# Patient Record
Sex: Female | Born: 1935 | Race: Black or African American | Hispanic: No | Smoking: Former smoker
Health system: Southern US, Community
[De-identification: ages and names within clinical notes are randomized; demographics above are authoritative.]

## PROBLEM LIST (undated history)

## (undated) DIAGNOSIS — I209 Angina pectoris, unspecified: Secondary | ICD-10-CM

## (undated) DIAGNOSIS — E785 Hyperlipidemia, unspecified: Secondary | ICD-10-CM

## (undated) DIAGNOSIS — H18599 Other hereditary corneal dystrophies, unspecified eye: Secondary | ICD-10-CM

## (undated) DIAGNOSIS — H1859 Other hereditary corneal dystrophies: Secondary | ICD-10-CM

## (undated) DIAGNOSIS — M1990 Unspecified osteoarthritis, unspecified site: Secondary | ICD-10-CM

## (undated) DIAGNOSIS — M791 Myalgia, unspecified site: Secondary | ICD-10-CM

## (undated) DIAGNOSIS — D649 Anemia, unspecified: Secondary | ICD-10-CM

## (undated) DIAGNOSIS — J45909 Unspecified asthma, uncomplicated: Secondary | ICD-10-CM

## (undated) DIAGNOSIS — I219 Acute myocardial infarction, unspecified: Secondary | ICD-10-CM

## (undated) DIAGNOSIS — G4733 Obstructive sleep apnea (adult) (pediatric): Secondary | ICD-10-CM

## (undated) DIAGNOSIS — IMO0001 Reserved for inherently not codable concepts without codable children: Secondary | ICD-10-CM

## (undated) DIAGNOSIS — I509 Heart failure, unspecified: Secondary | ICD-10-CM

## (undated) DIAGNOSIS — Z9989 Dependence on other enabling machines and devices: Secondary | ICD-10-CM

## (undated) DIAGNOSIS — J189 Pneumonia, unspecified organism: Secondary | ICD-10-CM

## (undated) DIAGNOSIS — E119 Type 2 diabetes mellitus without complications: Secondary | ICD-10-CM

## (undated) DIAGNOSIS — E46 Unspecified protein-calorie malnutrition: Secondary | ICD-10-CM

## (undated) DIAGNOSIS — J449 Chronic obstructive pulmonary disease, unspecified: Secondary | ICD-10-CM

## (undated) DIAGNOSIS — I2510 Atherosclerotic heart disease of native coronary artery without angina pectoris: Secondary | ICD-10-CM

## (undated) DIAGNOSIS — IMO0002 Reserved for concepts with insufficient information to code with codable children: Secondary | ICD-10-CM

## (undated) DIAGNOSIS — R21 Rash and other nonspecific skin eruption: Secondary | ICD-10-CM

## (undated) DIAGNOSIS — R011 Cardiac murmur, unspecified: Secondary | ICD-10-CM

## (undated) DIAGNOSIS — I5032 Chronic diastolic (congestive) heart failure: Secondary | ICD-10-CM

## (undated) DIAGNOSIS — I10 Essential (primary) hypertension: Secondary | ICD-10-CM

## (undated) DIAGNOSIS — E876 Hypokalemia: Secondary | ICD-10-CM

## (undated) DIAGNOSIS — J302 Other seasonal allergic rhinitis: Secondary | ICD-10-CM

## (undated) HISTORY — DX: Acute myocardial infarction, unspecified: I21.9

## (undated) HISTORY — DX: Chronic diastolic (congestive) heart failure: I50.32

## (undated) HISTORY — PX: CHOLECYSTECTOMY: SHX55

## (undated) HISTORY — DX: Myalgia, unspecified site: M79.10

## (undated) HISTORY — DX: Hyperlipidemia, unspecified: E78.5

## (undated) HISTORY — DX: Anemia, unspecified: D64.9

## (undated) HISTORY — DX: Unspecified protein-calorie malnutrition: E46

## (undated) HISTORY — DX: Other hereditary corneal dystrophies: H18.59

## (undated) HISTORY — DX: Unspecified osteoarthritis, unspecified site: M19.90

## (undated) HISTORY — PX: EYE SURGERY: SHX253

## (undated) HISTORY — PX: CORNEAL TRANSPLANT: SHX108

## (undated) HISTORY — DX: Heart failure, unspecified: I50.9

## (undated) HISTORY — PX: OTHER SURGICAL HISTORY: SHX169

## (undated) HISTORY — DX: Essential (primary) hypertension: I10

## (undated) HISTORY — DX: Other hereditary corneal dystrophies, unspecified eye: H18.599

## (undated) HISTORY — PX: KNEE CARTILAGE SURGERY: SHX688

## (undated) HISTORY — DX: Reserved for concepts with insufficient information to code with codable children: IMO0002

## (undated) HISTORY — DX: Pneumonia, unspecified organism: J18.9

## (undated) HISTORY — PX: A FLUTTER ABLATION: SHX5348

## (undated) HISTORY — DX: Atherosclerotic heart disease of native coronary artery without angina pectoris: I25.10

## (undated) HISTORY — DX: Hypokalemia: E87.6

## (undated) HISTORY — PX: CATARACT EXTRACTION W/ INTRAOCULAR LENS  IMPLANT, BILATERAL: SHX1307

---

## 2002-06-06 ENCOUNTER — Encounter: Payer: Self-pay | Admitting: Internal Medicine

## 2002-06-06 ENCOUNTER — Encounter: Admission: RE | Admit: 2002-06-06 | Discharge: 2002-06-06 | Payer: Self-pay | Admitting: Internal Medicine

## 2003-12-10 ENCOUNTER — Encounter: Admission: RE | Admit: 2003-12-10 | Discharge: 2003-12-10 | Payer: Self-pay | Admitting: Orthopedic Surgery

## 2003-12-26 ENCOUNTER — Ambulatory Visit (HOSPITAL_COMMUNITY): Admission: RE | Admit: 2003-12-26 | Discharge: 2003-12-26 | Payer: Self-pay | Admitting: Orthopedic Surgery

## 2004-11-05 ENCOUNTER — Ambulatory Visit: Payer: Self-pay

## 2005-01-18 ENCOUNTER — Ambulatory Visit: Payer: Self-pay | Admitting: Internal Medicine

## 2005-01-26 ENCOUNTER — Ambulatory Visit: Payer: Self-pay | Admitting: Internal Medicine

## 2005-02-05 ENCOUNTER — Ambulatory Visit: Payer: Self-pay | Admitting: Internal Medicine

## 2005-05-19 ENCOUNTER — Ambulatory Visit: Payer: Self-pay | Admitting: Internal Medicine

## 2005-11-23 ENCOUNTER — Ambulatory Visit: Payer: Self-pay | Admitting: Internal Medicine

## 2006-02-23 ENCOUNTER — Ambulatory Visit: Payer: Self-pay | Admitting: Internal Medicine

## 2006-03-08 ENCOUNTER — Ambulatory Visit: Payer: Self-pay | Admitting: Internal Medicine

## 2006-05-27 HISTORY — PX: COLONOSCOPY: SHX174

## 2006-06-13 ENCOUNTER — Encounter: Payer: Self-pay | Admitting: Gastroenterology

## 2006-06-16 ENCOUNTER — Ambulatory Visit: Payer: Self-pay | Admitting: Internal Medicine

## 2006-08-05 ENCOUNTER — Ambulatory Visit: Payer: Self-pay | Admitting: Internal Medicine

## 2006-11-10 ENCOUNTER — Ambulatory Visit: Payer: Self-pay | Admitting: Internal Medicine

## 2006-11-10 LAB — CONVERTED CEMR LAB
ALT: 13 units/L (ref 0–40)
AST: 17 units/L (ref 0–37)
BUN: 18 mg/dL (ref 6–23)
Chol/HDL Ratio, serum: 3.3
Cholesterol: 117 mg/dL (ref 0–200)
Creatinine, Ser: 0.8 mg/dL (ref 0.4–1.2)
HDL: 35.9 mg/dL — ABNORMAL LOW (ref >39.0)
Hgb A1c MFr Bld: 7.1 % — ABNORMAL HIGH (ref 4.6–6.0)
LDL Cholesterol: 71 mg/dL (ref 0–99)
Triglyceride fasting, serum: 52 mg/dL (ref 0–149)
VLDL: 10 mg/dL (ref 0–40)

## 2006-11-21 ENCOUNTER — Ambulatory Visit: Payer: Self-pay | Admitting: Internal Medicine

## 2006-12-27 DIAGNOSIS — I219 Acute myocardial infarction, unspecified: Secondary | ICD-10-CM

## 2006-12-27 HISTORY — DX: Acute myocardial infarction, unspecified: I21.9

## 2007-02-25 HISTORY — PX: CORONARY ANGIOPLASTY WITH STENT PLACEMENT: SHX49

## 2007-03-13 ENCOUNTER — Ambulatory Visit: Payer: Self-pay | Admitting: Cardiology

## 2007-03-13 ENCOUNTER — Inpatient Hospital Stay (HOSPITAL_COMMUNITY): Admission: AD | Admit: 2007-03-13 | Discharge: 2007-03-15 | Payer: Self-pay | Admitting: Internal Medicine

## 2007-03-13 ENCOUNTER — Ambulatory Visit: Payer: Self-pay | Admitting: Internal Medicine

## 2007-03-14 ENCOUNTER — Ambulatory Visit: Payer: Self-pay | Admitting: *Deleted

## 2007-03-21 ENCOUNTER — Ambulatory Visit: Payer: Self-pay | Admitting: Cardiology

## 2007-03-21 LAB — CONVERTED CEMR LAB
INR: 7.3 (ref 0.9–2.0)
Prothrombin Time: 35.3 s (ref 10.0–14.0)

## 2007-03-27 ENCOUNTER — Ambulatory Visit: Payer: Self-pay | Admitting: Cardiovascular Disease

## 2007-04-03 ENCOUNTER — Ambulatory Visit: Payer: Self-pay | Admitting: Cardiology

## 2007-04-06 ENCOUNTER — Ambulatory Visit: Payer: Self-pay | Admitting: Cardiology

## 2007-04-14 ENCOUNTER — Ambulatory Visit: Payer: Self-pay | Admitting: Internal Medicine

## 2007-04-24 ENCOUNTER — Ambulatory Visit: Payer: Self-pay | Admitting: Cardiology

## 2007-05-01 ENCOUNTER — Ambulatory Visit: Payer: Self-pay | Admitting: Cardiology

## 2007-05-08 ENCOUNTER — Ambulatory Visit: Payer: Self-pay | Admitting: Internal Medicine

## 2007-05-12 DIAGNOSIS — H1851 Endothelial corneal dystrophy: Secondary | ICD-10-CM

## 2007-05-15 ENCOUNTER — Ambulatory Visit: Payer: Self-pay | Admitting: Internal Medicine

## 2007-05-23 ENCOUNTER — Ambulatory Visit: Payer: Self-pay | Admitting: Cardiology

## 2007-05-31 ENCOUNTER — Ambulatory Visit: Payer: Self-pay | Admitting: Cardiology

## 2007-05-31 LAB — CONVERTED CEMR LAB
INR: 6.1 (ref 0.9–2.0)
Prothrombin Time: 32 s (ref 10.0–14.0)

## 2007-06-07 ENCOUNTER — Ambulatory Visit: Payer: Self-pay | Admitting: Cardiology

## 2007-06-14 ENCOUNTER — Ambulatory Visit: Payer: Self-pay | Admitting: Cardiology

## 2007-06-20 ENCOUNTER — Ambulatory Visit: Payer: Self-pay | Admitting: Cardiology

## 2007-06-26 ENCOUNTER — Ambulatory Visit: Payer: Self-pay | Admitting: Cardiology

## 2007-06-29 ENCOUNTER — Ambulatory Visit: Payer: Self-pay | Admitting: Internal Medicine

## 2007-06-29 LAB — CONVERTED CEMR LAB
BUN: 14 mg/dL (ref 6–23)
Basophils Absolute: 0.1 10*3/uL (ref 0.0–0.1)
Basophils Relative: 1.1 % — ABNORMAL HIGH (ref 0.0–1.0)
CO2: 30 meq/L (ref 19–32)
Calcium: 9.3 mg/dL (ref 8.4–10.5)
Chloride: 100 meq/L (ref 96–112)
Creatinine, Ser: 0.9 mg/dL (ref 0.4–1.2)
Eosinophils Absolute: 0.3 10*3/uL (ref 0.0–0.6)
Eosinophils Relative: 6.1 % — ABNORMAL HIGH (ref 0.0–5.0)
GFR calc Af Amer: 79 mL/min
GFR calc non Af Amer: 65 mL/min
Glucose, Bld: 235 mg/dL — ABNORMAL HIGH (ref 70–99)
HCT: 39.3 % (ref 36.0–46.0)
Hemoglobin: 13 g/dL (ref 12.0–15.0)
INR: 2.8 — ABNORMAL HIGH (ref 0.9–2.0)
Lymphocytes Relative: 20.1 % (ref 12.0–46.0)
MCHC: 33.1 g/dL (ref 30.0–36.0)
MCV: 84.5 fL (ref 78.0–100.0)
Monocytes Absolute: 0.3 10*3/uL (ref 0.2–0.7)
Monocytes Relative: 5.6 % (ref 3.0–11.0)
Neutro Abs: 3.3 10*3/uL (ref 1.4–7.7)
Neutrophils Relative %: 67.1 % (ref 43.0–77.0)
Platelets: 258 10*3/uL (ref 150–400)
Potassium: 4 meq/L (ref 3.5–5.1)
Prothrombin Time: 21.2 s — ABNORMAL HIGH (ref 10.0–14.0)
RBC: 4.65 M/uL (ref 3.87–5.11)
RDW: 13.5 % (ref 11.5–14.6)
Sodium: 137 meq/L (ref 135–145)
WBC: 5 10*3/uL (ref 4.5–10.5)
aPTT: 37.3 s — ABNORMAL HIGH (ref 26.5–36.5)

## 2007-07-04 ENCOUNTER — Ambulatory Visit: Payer: Self-pay | Admitting: Cardiology

## 2007-07-05 ENCOUNTER — Ambulatory Visit (HOSPITAL_COMMUNITY): Admission: RE | Admit: 2007-07-05 | Discharge: 2007-07-06 | Payer: Self-pay | Admitting: Internal Medicine

## 2007-07-05 ENCOUNTER — Ambulatory Visit: Payer: Self-pay | Admitting: Internal Medicine

## 2007-07-05 ENCOUNTER — Encounter: Payer: Self-pay | Admitting: Internal Medicine

## 2007-07-05 ENCOUNTER — Encounter: Payer: Self-pay | Admitting: Cardiology

## 2007-07-11 ENCOUNTER — Ambulatory Visit: Payer: Self-pay | Admitting: Cardiovascular Disease

## 2007-07-18 ENCOUNTER — Ambulatory Visit: Payer: Self-pay

## 2007-07-18 ENCOUNTER — Encounter: Payer: Self-pay | Admitting: Internal Medicine

## 2007-08-09 ENCOUNTER — Ambulatory Visit: Payer: Self-pay | Admitting: Internal Medicine

## 2007-08-31 ENCOUNTER — Encounter: Payer: Self-pay | Admitting: Internal Medicine

## 2007-09-08 ENCOUNTER — Encounter (INDEPENDENT_AMBULATORY_CARE_PROVIDER_SITE_OTHER): Payer: Self-pay | Admitting: Family Medicine

## 2007-10-05 ENCOUNTER — Telehealth (INDEPENDENT_AMBULATORY_CARE_PROVIDER_SITE_OTHER): Payer: Self-pay | Admitting: *Deleted

## 2007-10-13 ENCOUNTER — Ambulatory Visit: Payer: Self-pay | Admitting: Internal Medicine

## 2007-10-18 ENCOUNTER — Telehealth (INDEPENDENT_AMBULATORY_CARE_PROVIDER_SITE_OTHER): Payer: Self-pay | Admitting: *Deleted

## 2007-10-22 LAB — CONVERTED CEMR LAB
ALT: 20 units/L (ref 0–35)
AST: 26 units/L (ref 0–37)
BUN: 14 mg/dL (ref 6–23)
Cholesterol: 171 mg/dL (ref 0–200)
Creatinine, Ser: 0.8 mg/dL (ref 0.4–1.2)
Creatinine,U: 174.7 mg/dL
HDL: 45.5 mg/dL (ref >39.0)
Hgb A1c MFr Bld: 8.1 % — ABNORMAL HIGH (ref 4.6–6.0)
LDL Cholesterol: 115 mg/dL — ABNORMAL HIGH (ref 0–99)
Microalb Creat Ratio: 31.5 mg/g — ABNORMAL HIGH (ref 0.0–30.0)
Microalb, Ur: 5.5 mg/dL — ABNORMAL HIGH (ref 0.0–1.9)
Potassium: 4 meq/L (ref 3.5–5.1)
Total CHOL/HDL Ratio: 3.8
Triglycerides: 55 mg/dL (ref 0–149)
VLDL: 11 mg/dL (ref 0–40)

## 2007-10-24 ENCOUNTER — Encounter (INDEPENDENT_AMBULATORY_CARE_PROVIDER_SITE_OTHER): Payer: Self-pay | Admitting: *Deleted

## 2007-10-30 ENCOUNTER — Ambulatory Visit: Payer: Self-pay | Admitting: Internal Medicine

## 2007-10-30 DIAGNOSIS — I10 Essential (primary) hypertension: Secondary | ICD-10-CM | POA: Insufficient documentation

## 2007-10-30 DIAGNOSIS — E1159 Type 2 diabetes mellitus with other circulatory complications: Secondary | ICD-10-CM | POA: Insufficient documentation

## 2007-11-10 ENCOUNTER — Telehealth: Payer: Self-pay | Admitting: Internal Medicine

## 2008-01-03 ENCOUNTER — Telehealth (INDEPENDENT_AMBULATORY_CARE_PROVIDER_SITE_OTHER): Payer: Self-pay | Admitting: *Deleted

## 2008-01-25 ENCOUNTER — Ambulatory Visit: Payer: Self-pay | Admitting: Internal Medicine

## 2008-01-28 LAB — CONVERTED CEMR LAB
ALT: 16 units/L (ref 0–35)
AST: 22 units/L (ref 0–37)
Albumin: 3.8 g/dL (ref 3.5–5.2)
Alkaline Phosphatase: 28 units/L — ABNORMAL LOW (ref 39–117)
BUN: 16 mg/dL (ref 6–23)
Bilirubin, Direct: 0.1 mg/dL (ref 0.0–0.3)
CO2: 31 meq/L (ref 19–32)
Calcium: 10 mg/dL (ref 8.4–10.5)
Chloride: 102 meq/L (ref 96–112)
Cholesterol: 152 mg/dL (ref 0–200)
Creatinine, Ser: 0.8 mg/dL (ref 0.4–1.2)
Creatinine,U: 183.9 mg/dL
GFR calc Af Amer: 90 mL/min
GFR calc non Af Amer: 75 mL/min
Glucose, Bld: 121 mg/dL — ABNORMAL HIGH (ref 70–99)
HDL: 49.8 mg/dL (ref >39.0)
Hgb A1c MFr Bld: 7.3 % — ABNORMAL HIGH (ref 4.6–6.0)
LDL Cholesterol: 92 mg/dL (ref 0–99)
Microalb Creat Ratio: 10.3 mg/g (ref 0.0–30.0)
Microalb, Ur: 1.9 mg/dL (ref 0.0–1.9)
Potassium: 4.5 meq/L (ref 3.5–5.1)
Sodium: 141 meq/L (ref 135–145)
Total Bilirubin: 0.7 mg/dL (ref 0.3–1.2)
Total CHOL/HDL Ratio: 3.1
Total Protein: 7.4 g/dL (ref 6.0–8.3)
Triglycerides: 49 mg/dL (ref 0–149)
VLDL: 10 mg/dL (ref 0–40)

## 2008-01-29 ENCOUNTER — Ambulatory Visit: Payer: Self-pay | Admitting: Internal Medicine

## 2008-01-29 ENCOUNTER — Encounter (INDEPENDENT_AMBULATORY_CARE_PROVIDER_SITE_OTHER): Payer: Self-pay | Admitting: *Deleted

## 2008-01-29 DIAGNOSIS — E1169 Type 2 diabetes mellitus with other specified complication: Secondary | ICD-10-CM | POA: Insufficient documentation

## 2008-01-29 DIAGNOSIS — I2510 Atherosclerotic heart disease of native coronary artery without angina pectoris: Secondary | ICD-10-CM | POA: Insufficient documentation

## 2008-01-29 DIAGNOSIS — E782 Mixed hyperlipidemia: Secondary | ICD-10-CM | POA: Insufficient documentation

## 2008-02-13 ENCOUNTER — Ambulatory Visit: Payer: Self-pay | Admitting: Internal Medicine

## 2008-06-04 ENCOUNTER — Telehealth (INDEPENDENT_AMBULATORY_CARE_PROVIDER_SITE_OTHER): Payer: Self-pay | Admitting: *Deleted

## 2008-06-17 ENCOUNTER — Telehealth (INDEPENDENT_AMBULATORY_CARE_PROVIDER_SITE_OTHER): Payer: Self-pay | Admitting: *Deleted

## 2008-06-25 ENCOUNTER — Ambulatory Visit: Payer: Self-pay | Admitting: Internal Medicine

## 2008-06-25 DIAGNOSIS — I4892 Unspecified atrial flutter: Secondary | ICD-10-CM

## 2008-06-25 LAB — CONVERTED CEMR LAB
Cholesterol, target level: 200 mg/dL
HDL goal, serum: 40 mg/dL
LDL Goal: 70 mg/dL

## 2008-06-30 LAB — CONVERTED CEMR LAB
ALT: 16 units/L (ref 0–35)
AST: 24 units/L (ref 0–37)
Albumin: 4.1 g/dL (ref 3.5–5.2)
Alkaline Phosphatase: 39 units/L (ref 39–117)
BUN: 15 mg/dL (ref 6–23)
Basophils Absolute: 0 10*3/uL (ref 0.0–0.1)
Basophils Relative: 0.2 % (ref 0.0–1.0)
Bilirubin, Direct: 0.1 mg/dL (ref 0.0–0.3)
Cholesterol: 167 mg/dL (ref 0–200)
Creatinine, Ser: 0.9 mg/dL (ref 0.4–1.2)
Eosinophils Absolute: 0.3 10*3/uL (ref 0.0–0.7)
Eosinophils Relative: 6.4 % — ABNORMAL HIGH (ref 0.0–5.0)
HCT: 35.3 % — ABNORMAL LOW (ref 36.0–46.0)
HDL: 54 mg/dL (ref >39.0)
Hemoglobin: 11.6 g/dL — ABNORMAL LOW (ref 12.0–15.0)
LDL Cholesterol: 103 mg/dL — ABNORMAL HIGH (ref 0–99)
Lymphocytes Relative: 31.5 % (ref 12.0–46.0)
MCHC: 32.8 g/dL (ref 30.0–36.0)
MCV: 86.5 fL (ref 78.0–100.0)
Monocytes Absolute: 0.6 10*3/uL (ref 0.1–1.0)
Monocytes Relative: 13.9 % — ABNORMAL HIGH (ref 3.0–12.0)
Neutro Abs: 2.3 10*3/uL (ref 1.4–7.7)
Neutrophils Relative %: 48 % (ref 43.0–77.0)
Platelets: 251 10*3/uL (ref 150–400)
Potassium: 3.8 meq/L (ref 3.5–5.1)
RBC: 4.08 M/uL (ref 3.87–5.11)
RDW: 13.3 % (ref 11.5–14.6)
TSH: 1.82 microintl units/mL (ref 0.35–5.50)
Total Bilirubin: 0.7 mg/dL (ref 0.3–1.2)
Total CHOL/HDL Ratio: 3.1
Total Protein: 8.2 g/dL (ref 6.0–8.3)
Triglycerides: 48 mg/dL (ref 0–149)
VLDL: 10 mg/dL (ref 0–40)
WBC: 4.6 10*3/uL (ref 4.5–10.5)

## 2008-07-02 ENCOUNTER — Encounter (INDEPENDENT_AMBULATORY_CARE_PROVIDER_SITE_OTHER): Payer: Self-pay | Admitting: *Deleted

## 2008-07-08 ENCOUNTER — Encounter (INDEPENDENT_AMBULATORY_CARE_PROVIDER_SITE_OTHER): Payer: Self-pay | Admitting: *Deleted

## 2008-07-08 ENCOUNTER — Ambulatory Visit: Payer: Self-pay | Admitting: Internal Medicine

## 2008-07-08 LAB — CONVERTED CEMR LAB
OCCULT 1: NEGATIVE
OCCULT 2: NEGATIVE
OCCULT 3: NEGATIVE

## 2008-07-23 ENCOUNTER — Telehealth (INDEPENDENT_AMBULATORY_CARE_PROVIDER_SITE_OTHER): Payer: Self-pay | Admitting: *Deleted

## 2008-10-21 ENCOUNTER — Ambulatory Visit: Payer: Self-pay | Admitting: Internal Medicine

## 2008-11-04 LAB — CONVERTED CEMR LAB
ALT: 13 units/L (ref 0–35)
AST: 22 units/L (ref 0–37)
Basophils Absolute: 0 10*3/uL (ref 0.0–0.1)
Basophils Relative: 0.8 % (ref 0.0–3.0)
Cholesterol: 207 mg/dL (ref 0–200)
Direct LDL: 130.7 mg/dL
Eosinophils Absolute: 0.3 10*3/uL (ref 0.0–0.7)
Eosinophils Relative: 7.5 % — ABNORMAL HIGH (ref 0.0–5.0)
Folate: >20 ng/mL
HCT: 30.5 % — ABNORMAL LOW (ref 36.0–46.0)
HDL: 59.2 mg/dL (ref >39.0)
Hemoglobin: 10 g/dL — ABNORMAL LOW (ref 12.0–15.0)
Hgb A1c MFr Bld: 7.1 % — ABNORMAL HIGH (ref 4.6–6.0)
Iron: 14 ug/dL — ABNORMAL LOW (ref 42–145)
Lymphocytes Relative: 27.7 % (ref 12.0–46.0)
MCHC: 32.7 g/dL (ref 30.0–36.0)
MCV: 81.5 fL (ref 78.0–100.0)
Monocytes Absolute: 0.7 10*3/uL (ref 0.1–1.0)
Monocytes Relative: 15 % — ABNORMAL HIGH (ref 3.0–12.0)
Neutro Abs: 2.2 10*3/uL (ref 1.4–7.7)
Neutrophils Relative %: 49 % (ref 43.0–77.0)
Platelets: 250 10*3/uL (ref 150–400)
RBC: 3.74 M/uL — ABNORMAL LOW (ref 3.87–5.11)
RDW: 14.7 % — ABNORMAL HIGH (ref 11.5–14.6)
Total CHOL/HDL Ratio: 3.5
Triglycerides: 45 mg/dL (ref 0–149)
VLDL: 9 mg/dL (ref 0–40)
Vitamin B-12: 157 pg/mL — ABNORMAL LOW (ref 211–911)
WBC: 4.4 10*3/uL — ABNORMAL LOW (ref 4.5–10.5)

## 2008-11-06 ENCOUNTER — Encounter (INDEPENDENT_AMBULATORY_CARE_PROVIDER_SITE_OTHER): Payer: Self-pay | Admitting: *Deleted

## 2008-11-20 ENCOUNTER — Ambulatory Visit: Payer: Self-pay | Admitting: Internal Medicine

## 2008-11-20 DIAGNOSIS — D510 Vitamin B12 deficiency anemia due to intrinsic factor deficiency: Secondary | ICD-10-CM

## 2008-11-20 DIAGNOSIS — M1990 Unspecified osteoarthritis, unspecified site: Secondary | ICD-10-CM | POA: Insufficient documentation

## 2008-11-22 ENCOUNTER — Encounter (INDEPENDENT_AMBULATORY_CARE_PROVIDER_SITE_OTHER): Payer: Self-pay | Admitting: *Deleted

## 2008-11-22 ENCOUNTER — Telehealth: Payer: Self-pay | Admitting: Internal Medicine

## 2008-11-26 ENCOUNTER — Telehealth (INDEPENDENT_AMBULATORY_CARE_PROVIDER_SITE_OTHER): Payer: Self-pay | Admitting: *Deleted

## 2008-11-27 ENCOUNTER — Ambulatory Visit: Payer: Self-pay | Admitting: Internal Medicine

## 2008-11-27 ENCOUNTER — Encounter (INDEPENDENT_AMBULATORY_CARE_PROVIDER_SITE_OTHER): Payer: Self-pay | Admitting: *Deleted

## 2008-11-27 LAB — CONVERTED CEMR LAB
OCCULT 1: NEGATIVE
OCCULT 2: NEGATIVE
OCCULT 3: NEGATIVE
Rheumatoid fact SerPl-aCnc: <20 intl units/mL — ABNORMAL LOW (ref 0.0–20.0)
Sed Rate: 40 mm/hr — ABNORMAL HIGH (ref 0–22)
Total CK: 90 units/L (ref 7–177)
Uric Acid, Serum: 3.7 mg/dL (ref 2.4–7.0)
Vit D, 1,25-Dihydroxy: 33 (ref 30–89)

## 2008-11-28 ENCOUNTER — Encounter (INDEPENDENT_AMBULATORY_CARE_PROVIDER_SITE_OTHER): Payer: Self-pay | Admitting: *Deleted

## 2008-12-04 ENCOUNTER — Ambulatory Visit: Payer: Self-pay | Admitting: Internal Medicine

## 2008-12-11 ENCOUNTER — Ambulatory Visit: Payer: Self-pay | Admitting: Internal Medicine

## 2008-12-11 ENCOUNTER — Telehealth (INDEPENDENT_AMBULATORY_CARE_PROVIDER_SITE_OTHER): Payer: Self-pay | Admitting: *Deleted

## 2008-12-24 ENCOUNTER — Ambulatory Visit: Payer: Self-pay | Admitting: Cardiology

## 2008-12-24 ENCOUNTER — Ambulatory Visit: Payer: Self-pay | Admitting: Internal Medicine

## 2008-12-24 ENCOUNTER — Inpatient Hospital Stay (HOSPITAL_COMMUNITY): Admission: EM | Admit: 2008-12-24 | Discharge: 2008-12-27 | Payer: Self-pay | Admitting: Emergency Medicine

## 2008-12-24 DIAGNOSIS — R5383 Other fatigue: Secondary | ICD-10-CM

## 2008-12-24 DIAGNOSIS — R0989 Other specified symptoms and signs involving the circulatory and respiratory systems: Secondary | ICD-10-CM

## 2008-12-24 DIAGNOSIS — R0609 Other forms of dyspnea: Secondary | ICD-10-CM

## 2008-12-24 DIAGNOSIS — R5381 Other malaise: Secondary | ICD-10-CM

## 2008-12-25 ENCOUNTER — Encounter (INDEPENDENT_AMBULATORY_CARE_PROVIDER_SITE_OTHER): Payer: Self-pay | Admitting: Emergency Medicine

## 2008-12-27 ENCOUNTER — Encounter: Payer: Self-pay | Admitting: Endocrinology

## 2008-12-28 ENCOUNTER — Telehealth: Payer: Self-pay | Admitting: Internal Medicine

## 2008-12-30 ENCOUNTER — Telehealth (INDEPENDENT_AMBULATORY_CARE_PROVIDER_SITE_OTHER): Payer: Self-pay | Admitting: *Deleted

## 2008-12-31 ENCOUNTER — Ambulatory Visit: Payer: Self-pay | Admitting: Internal Medicine

## 2009-01-02 ENCOUNTER — Ambulatory Visit: Payer: Self-pay | Admitting: Internal Medicine

## 2009-01-02 DIAGNOSIS — D509 Iron deficiency anemia, unspecified: Secondary | ICD-10-CM

## 2009-01-07 ENCOUNTER — Ambulatory Visit: Payer: Self-pay | Admitting: Cardiovascular Disease

## 2009-01-08 ENCOUNTER — Encounter (INDEPENDENT_AMBULATORY_CARE_PROVIDER_SITE_OTHER): Payer: Self-pay | Admitting: *Deleted

## 2009-01-08 ENCOUNTER — Ambulatory Visit: Payer: Self-pay | Admitting: Gastroenterology

## 2009-01-08 ENCOUNTER — Ambulatory Visit: Payer: Self-pay | Admitting: Internal Medicine

## 2009-01-08 LAB — CONVERTED CEMR LAB
Basophils Absolute: 0.1 K/uL
Basophils Relative: 1.1 %
Eosinophils Absolute: 0.2 K/uL
Eosinophils Relative: 3.8 %
HCT: 31.5 % — ABNORMAL LOW
Hemoglobin: 10.2 g/dL — ABNORMAL LOW
Lymphocytes Relative: 20.2 %
MCHC: 32.4 g/dL
MCV: 77.4 fL — ABNORMAL LOW
Monocytes Absolute: 0.3 K/uL
Monocytes Relative: 6.1 %
Neutro Abs: 3.5 K/uL
Neutrophils Relative %: 68.8 %
Platelets: 304 K/uL
RBC: 4.07 M/uL
RDW: 16.1 % — ABNORMAL HIGH
WBC: 5.1 10*3/microliter

## 2009-01-09 ENCOUNTER — Encounter (INDEPENDENT_AMBULATORY_CARE_PROVIDER_SITE_OTHER): Payer: Self-pay | Admitting: *Deleted

## 2009-01-09 ENCOUNTER — Ambulatory Visit: Payer: Self-pay | Admitting: Internal Medicine

## 2009-01-09 LAB — CONVERTED CEMR LAB
OCCULT 1: NEGATIVE
OCCULT 2: NEGATIVE
OCCULT 3: NEGATIVE

## 2009-01-17 ENCOUNTER — Ambulatory Visit: Payer: Self-pay | Admitting: Internal Medicine

## 2009-01-21 ENCOUNTER — Encounter: Payer: Self-pay | Admitting: Gastroenterology

## 2009-01-21 ENCOUNTER — Ambulatory Visit: Payer: Self-pay | Admitting: Gastroenterology

## 2009-01-23 ENCOUNTER — Encounter: Payer: Self-pay | Admitting: Gastroenterology

## 2009-01-27 ENCOUNTER — Telehealth: Payer: Self-pay | Admitting: Gastroenterology

## 2009-01-28 ENCOUNTER — Telehealth (INDEPENDENT_AMBULATORY_CARE_PROVIDER_SITE_OTHER): Payer: Self-pay | Admitting: *Deleted

## 2009-01-28 ENCOUNTER — Telehealth: Payer: Self-pay | Admitting: Gastroenterology

## 2009-01-29 ENCOUNTER — Encounter: Payer: Self-pay | Admitting: Internal Medicine

## 2009-01-29 ENCOUNTER — Ambulatory Visit: Payer: Self-pay | Admitting: Cardiology

## 2009-01-29 ENCOUNTER — Ambulatory Visit: Payer: Self-pay | Admitting: Internal Medicine

## 2009-01-29 LAB — CONVERTED CEMR LAB
BUN: 15 mg/dL (ref 6–23)
Basophils Absolute: 0 10*3/uL (ref 0.0–0.1)
Basophils Relative: 0.5 % (ref 0.0–3.0)
CO2: 29 meq/L (ref 19–32)
Calcium: 9.6 mg/dL (ref 8.4–10.5)
Chloride: 102 meq/L (ref 96–112)
Creatinine, Ser: 0.8 mg/dL (ref 0.4–1.2)
Eosinophils Absolute: 0.2 10*3/uL (ref 0.0–0.7)
Eosinophils Relative: 4.5 % (ref 0.0–5.0)
GFR calc Af Amer: 90 mL/min
GFR calc non Af Amer: 74 mL/min
Glucose, Bld: 162 mg/dL — ABNORMAL HIGH (ref 70–99)
HCT: 35.5 % — ABNORMAL LOW (ref 36.0–46.0)
Hemoglobin: 11.1 g/dL — ABNORMAL LOW (ref 12.0–15.0)
INR: 2.2 — ABNORMAL HIGH (ref 0.8–1.0)
Lymphocytes Relative: 24.6 % (ref 12.0–46.0)
MCHC: 31.2 g/dL (ref 30.0–36.0)
MCV: 77.7 fL — ABNORMAL LOW (ref 78.0–100.0)
Magnesium: 1.5 mg/dL (ref 1.5–2.5)
Monocytes Absolute: 0.5 10*3/uL (ref 0.1–1.0)
Monocytes Relative: 10 % (ref 3.0–12.0)
Neutro Abs: 2.8 10*3/uL (ref 1.4–7.7)
Neutrophils Relative %: 60.4 % (ref 43.0–77.0)
Platelets: 298 10*3/uL (ref 150–400)
Potassium: 3.4 meq/L — ABNORMAL LOW (ref 3.5–5.1)
Prothrombin Time: 22.3 s — ABNORMAL HIGH (ref 10.9–13.3)
RBC: 4.56 M/uL (ref 3.87–5.11)
RDW: 17.9 % — ABNORMAL HIGH (ref 11.5–14.6)
Sodium: 141 meq/L (ref 135–145)
WBC: 4.7 10*3/uL (ref 4.5–10.5)
aPTT: 36.8 s — ABNORMAL HIGH (ref 21.7–29.8)

## 2009-01-30 ENCOUNTER — Telehealth: Payer: Self-pay | Admitting: Internal Medicine

## 2009-01-31 ENCOUNTER — Ambulatory Visit (HOSPITAL_COMMUNITY): Admission: RE | Admit: 2009-01-31 | Discharge: 2009-01-31 | Payer: Self-pay | Admitting: Internal Medicine

## 2009-01-31 ENCOUNTER — Ambulatory Visit: Payer: Self-pay | Admitting: Internal Medicine

## 2009-02-04 ENCOUNTER — Telehealth (INDEPENDENT_AMBULATORY_CARE_PROVIDER_SITE_OTHER): Payer: Self-pay | Admitting: *Deleted

## 2009-02-05 ENCOUNTER — Ambulatory Visit: Payer: Self-pay | Admitting: Internal Medicine

## 2009-02-05 DIAGNOSIS — E876 Hypokalemia: Secondary | ICD-10-CM

## 2009-02-09 LAB — CONVERTED CEMR LAB
Hgb A1c MFr Bld: 6.8 % — ABNORMAL HIGH (ref 4.6–6.0)
Iron: 39 ug/dL — ABNORMAL LOW (ref 42–145)
Potassium: 4.5 meq/L (ref 3.5–5.1)
Saturation Ratios: 7.7 % — ABNORMAL LOW (ref 20.0–50.0)
Transferrin: 360.8 mg/dL — ABNORMAL HIGH (ref >=212.0)

## 2009-02-10 ENCOUNTER — Encounter (INDEPENDENT_AMBULATORY_CARE_PROVIDER_SITE_OTHER): Payer: Self-pay | Admitting: *Deleted

## 2009-02-12 ENCOUNTER — Ambulatory Visit: Payer: Self-pay | Admitting: Cardiology

## 2009-02-21 ENCOUNTER — Ambulatory Visit: Payer: Self-pay | Admitting: Internal Medicine

## 2009-02-28 ENCOUNTER — Ambulatory Visit: Payer: Self-pay | Admitting: Internal Medicine

## 2009-03-05 ENCOUNTER — Ambulatory Visit: Payer: Self-pay | Admitting: Internal Medicine

## 2009-03-14 ENCOUNTER — Ambulatory Visit: Payer: Self-pay | Admitting: Cardiology

## 2009-04-04 ENCOUNTER — Ambulatory Visit: Payer: Self-pay | Admitting: Cardiology

## 2009-05-02 ENCOUNTER — Ambulatory Visit: Payer: Self-pay | Admitting: Cardiovascular Disease

## 2009-05-02 ENCOUNTER — Telehealth (INDEPENDENT_AMBULATORY_CARE_PROVIDER_SITE_OTHER): Payer: Self-pay | Admitting: *Deleted

## 2009-05-12 ENCOUNTER — Telehealth: Payer: Self-pay | Admitting: Internal Medicine

## 2009-05-13 ENCOUNTER — Ambulatory Visit: Payer: Self-pay | Admitting: Internal Medicine

## 2009-05-20 ENCOUNTER — Telehealth (INDEPENDENT_AMBULATORY_CARE_PROVIDER_SITE_OTHER): Payer: Self-pay | Admitting: *Deleted

## 2009-05-23 ENCOUNTER — Ambulatory Visit: Payer: Self-pay | Admitting: Internal Medicine

## 2009-05-23 LAB — CONVERTED CEMR LAB
POC INR: 4.1
Protime: 24.5

## 2009-05-27 ENCOUNTER — Encounter: Payer: Self-pay | Admitting: *Deleted

## 2009-06-06 ENCOUNTER — Ambulatory Visit: Payer: Self-pay | Admitting: Cardiovascular Disease

## 2009-06-06 LAB — CONVERTED CEMR LAB
POC INR: 1.9
Protime: 17.1

## 2009-06-10 ENCOUNTER — Ambulatory Visit: Payer: Self-pay | Admitting: Internal Medicine

## 2009-06-12 ENCOUNTER — Encounter (INDEPENDENT_AMBULATORY_CARE_PROVIDER_SITE_OTHER): Payer: Self-pay | Admitting: *Deleted

## 2009-06-12 LAB — CONVERTED CEMR LAB: Hgb A1c MFr Bld: 7.8 % — ABNORMAL HIGH (ref 4.6–6.5)

## 2009-06-16 ENCOUNTER — Ambulatory Visit: Payer: Self-pay | Admitting: Internal Medicine

## 2009-06-16 DIAGNOSIS — I495 Sick sinus syndrome: Secondary | ICD-10-CM

## 2009-06-18 ENCOUNTER — Ambulatory Visit: Payer: Self-pay | Admitting: Internal Medicine

## 2009-06-20 ENCOUNTER — Ambulatory Visit: Payer: Self-pay

## 2009-06-20 LAB — CONVERTED CEMR LAB
POC INR: 1.4
Prothrombin Time: 14.5 s

## 2009-06-23 LAB — CONVERTED CEMR LAB
ALT: 19 units/L (ref 0–35)
AST: 26 units/L (ref 0–37)
Albumin: 3.7 g/dL (ref 3.5–5.2)
Alkaline Phosphatase: 47 units/L (ref 39–117)
Bilirubin, Direct: 0.1 mg/dL (ref 0.0–0.3)
TSH: 1.36 microintl units/mL (ref 0.35–5.50)
Total Bilirubin: 0.9 mg/dL (ref 0.3–1.2)
Total Protein: 7.5 g/dL (ref 6.0–8.3)

## 2009-06-27 ENCOUNTER — Telehealth: Payer: Self-pay | Admitting: Internal Medicine

## 2009-07-01 ENCOUNTER — Ambulatory Visit: Payer: Self-pay | Admitting: Cardiology

## 2009-07-01 ENCOUNTER — Ambulatory Visit: Payer: Self-pay | Admitting: Internal Medicine

## 2009-07-01 ENCOUNTER — Encounter (INDEPENDENT_AMBULATORY_CARE_PROVIDER_SITE_OTHER): Payer: Self-pay | Admitting: Cardiology

## 2009-07-01 LAB — CONVERTED CEMR LAB
POC INR: 1.7
Prothrombin Time: 16.2 s

## 2009-07-02 ENCOUNTER — Encounter: Payer: Self-pay | Admitting: *Deleted

## 2009-07-14 ENCOUNTER — Telehealth: Payer: Self-pay | Admitting: Internal Medicine

## 2009-07-14 ENCOUNTER — Ambulatory Visit: Payer: Self-pay | Admitting: Internal Medicine

## 2009-07-14 ENCOUNTER — Ambulatory Visit: Payer: Self-pay

## 2009-07-15 ENCOUNTER — Telehealth: Payer: Self-pay | Admitting: Internal Medicine

## 2009-07-22 ENCOUNTER — Ambulatory Visit: Payer: Self-pay | Admitting: Internal Medicine

## 2009-07-22 LAB — CONVERTED CEMR LAB
POC INR: 2
Prothrombin Time: 17.3 s

## 2009-07-24 ENCOUNTER — Encounter: Payer: Self-pay | Admitting: Internal Medicine

## 2009-07-25 ENCOUNTER — Telehealth: Payer: Self-pay | Admitting: Internal Medicine

## 2009-07-30 ENCOUNTER — Telehealth (INDEPENDENT_AMBULATORY_CARE_PROVIDER_SITE_OTHER): Payer: Self-pay | Admitting: *Deleted

## 2009-08-07 ENCOUNTER — Telehealth (INDEPENDENT_AMBULATORY_CARE_PROVIDER_SITE_OTHER): Payer: Self-pay | Admitting: *Deleted

## 2009-08-14 ENCOUNTER — Encounter (INDEPENDENT_AMBULATORY_CARE_PROVIDER_SITE_OTHER): Payer: Self-pay | Admitting: *Deleted

## 2009-08-14 ENCOUNTER — Ambulatory Visit: Payer: Self-pay | Admitting: Cardiology

## 2009-08-14 LAB — CONVERTED CEMR LAB: POC INR: 2.1

## 2009-08-18 ENCOUNTER — Ambulatory Visit: Payer: Self-pay | Admitting: Internal Medicine

## 2009-08-18 LAB — CONVERTED CEMR LAB: Hgb A1c MFr Bld: 7.6 % — ABNORMAL HIGH (ref 4.6–6.5)

## 2009-08-26 ENCOUNTER — Telehealth: Payer: Self-pay | Admitting: Internal Medicine

## 2009-08-26 ENCOUNTER — Encounter: Payer: Self-pay | Admitting: Internal Medicine

## 2009-08-27 ENCOUNTER — Telehealth: Payer: Self-pay | Admitting: Internal Medicine

## 2009-08-29 ENCOUNTER — Ambulatory Visit: Payer: Self-pay | Admitting: Internal Medicine

## 2009-09-10 ENCOUNTER — Ambulatory Visit: Payer: Self-pay | Admitting: Internal Medicine

## 2009-09-10 LAB — CONVERTED CEMR LAB: POC INR: 2.6

## 2009-09-15 ENCOUNTER — Telehealth (INDEPENDENT_AMBULATORY_CARE_PROVIDER_SITE_OTHER): Payer: Self-pay | Admitting: *Deleted

## 2009-09-18 ENCOUNTER — Telehealth (INDEPENDENT_AMBULATORY_CARE_PROVIDER_SITE_OTHER): Payer: Self-pay | Admitting: *Deleted

## 2009-09-18 ENCOUNTER — Ambulatory Visit: Payer: Self-pay | Admitting: Family Medicine

## 2009-09-24 ENCOUNTER — Telehealth (INDEPENDENT_AMBULATORY_CARE_PROVIDER_SITE_OTHER): Payer: Self-pay | Admitting: *Deleted

## 2009-10-05 ENCOUNTER — Telehealth: Payer: Self-pay | Admitting: Family Medicine

## 2009-10-05 ENCOUNTER — Emergency Department (HOSPITAL_COMMUNITY): Admission: EM | Admit: 2009-10-05 | Discharge: 2009-10-05 | Payer: Self-pay | Admitting: Emergency Medicine

## 2009-10-08 ENCOUNTER — Ambulatory Visit: Payer: Self-pay | Admitting: Internal Medicine

## 2009-10-08 ENCOUNTER — Telehealth: Payer: Self-pay | Admitting: Internal Medicine

## 2009-10-08 LAB — CONVERTED CEMR LAB: POC INR: 4.1

## 2009-10-13 ENCOUNTER — Telehealth: Payer: Self-pay | Admitting: Internal Medicine

## 2009-10-14 ENCOUNTER — Telehealth (INDEPENDENT_AMBULATORY_CARE_PROVIDER_SITE_OTHER): Payer: Self-pay | Admitting: *Deleted

## 2009-10-17 ENCOUNTER — Ambulatory Visit: Payer: Self-pay | Admitting: Cardiology

## 2009-10-17 LAB — CONVERTED CEMR LAB: POC INR: 2.1

## 2009-10-20 ENCOUNTER — Ambulatory Visit: Payer: Self-pay | Admitting: Family Medicine

## 2009-10-28 ENCOUNTER — Ambulatory Visit: Payer: Self-pay | Admitting: Internal Medicine

## 2009-11-01 LAB — CONVERTED CEMR LAB
BUN: 18 mg/dL (ref 6–23)
Creatinine, Ser: 1.1 mg/dL (ref 0.4–1.2)
Creatinine,U: 274.3 mg/dL
Hgb A1c MFr Bld: 7.3 % — ABNORMAL HIGH (ref 4.6–6.5)
Microalb Creat Ratio: 7.3 mg/g (ref 0.0–30.0)
Microalb, Ur: 2 mg/dL — ABNORMAL HIGH (ref 0.0–1.9)

## 2009-11-06 ENCOUNTER — Ambulatory Visit: Payer: Self-pay | Admitting: Internal Medicine

## 2009-11-06 ENCOUNTER — Ambulatory Visit: Payer: Self-pay | Admitting: Cardiovascular Disease

## 2009-11-06 LAB — CONVERTED CEMR LAB: POC INR: 2.4

## 2009-11-07 ENCOUNTER — Ambulatory Visit: Payer: Self-pay | Admitting: Internal Medicine

## 2009-11-07 DIAGNOSIS — L80 Vitiligo: Secondary | ICD-10-CM | POA: Insufficient documentation

## 2009-11-28 ENCOUNTER — Ambulatory Visit: Payer: Self-pay | Admitting: Internal Medicine

## 2009-12-05 ENCOUNTER — Ambulatory Visit: Payer: Self-pay | Admitting: Cardiology

## 2009-12-05 ENCOUNTER — Encounter (INDEPENDENT_AMBULATORY_CARE_PROVIDER_SITE_OTHER): Payer: Self-pay | Admitting: Cardiology

## 2009-12-05 LAB — CONVERTED CEMR LAB: POC INR: 2.3

## 2009-12-29 ENCOUNTER — Telehealth (INDEPENDENT_AMBULATORY_CARE_PROVIDER_SITE_OTHER): Payer: Self-pay | Admitting: *Deleted

## 2009-12-30 ENCOUNTER — Ambulatory Visit: Payer: Self-pay | Admitting: Internal Medicine

## 2010-01-02 ENCOUNTER — Ambulatory Visit: Payer: Self-pay | Admitting: Cardiology

## 2010-01-02 LAB — CONVERTED CEMR LAB: POC INR: 1.6

## 2010-01-12 ENCOUNTER — Ambulatory Visit: Payer: Self-pay | Admitting: Cardiology

## 2010-01-12 LAB — CONVERTED CEMR LAB: POC INR: 2.3

## 2010-02-02 ENCOUNTER — Ambulatory Visit: Payer: Self-pay | Admitting: Cardiovascular Disease

## 2010-02-02 LAB — CONVERTED CEMR LAB: POC INR: 1.9

## 2010-02-03 ENCOUNTER — Ambulatory Visit: Payer: Self-pay | Admitting: Internal Medicine

## 2010-02-09 LAB — CONVERTED CEMR LAB
ALT: 18 U/L
AST: 21 U/L
Albumin: 3.6 g/dL
Alkaline Phosphatase: 44 U/L
Bilirubin, Direct: 0.1 mg/dL
Cholesterol: 204 mg/dL — ABNORMAL HIGH
Direct LDL: 144.6 mg/dL
HDL: 56.4 mg/dL
Hgb A1c MFr Bld: 6.9 % — ABNORMAL HIGH
Total Bilirubin: 0.7 mg/dL
Total CHOL/HDL Ratio: 4
Total Protein: 7.7 g/dL
Triglycerides: 63 mg/dL
VLDL: 12.6 mg/dL

## 2010-02-10 ENCOUNTER — Ambulatory Visit: Payer: Self-pay | Admitting: Internal Medicine

## 2010-02-10 DIAGNOSIS — E119 Type 2 diabetes mellitus without complications: Secondary | ICD-10-CM

## 2010-02-11 ENCOUNTER — Telehealth (INDEPENDENT_AMBULATORY_CARE_PROVIDER_SITE_OTHER): Payer: Self-pay | Admitting: *Deleted

## 2010-02-13 ENCOUNTER — Telehealth (INDEPENDENT_AMBULATORY_CARE_PROVIDER_SITE_OTHER): Payer: Self-pay | Admitting: *Deleted

## 2010-02-15 ENCOUNTER — Encounter: Payer: Self-pay | Admitting: Internal Medicine

## 2010-02-15 ENCOUNTER — Emergency Department (HOSPITAL_COMMUNITY): Admission: EM | Admit: 2010-02-15 | Discharge: 2010-02-15 | Payer: Self-pay | Admitting: Emergency Medicine

## 2010-02-15 ENCOUNTER — Emergency Department (HOSPITAL_COMMUNITY): Admission: EM | Admit: 2010-02-15 | Discharge: 2010-02-15 | Payer: Self-pay | Admitting: Family Medicine

## 2010-02-17 ENCOUNTER — Ambulatory Visit: Payer: Self-pay | Admitting: Internal Medicine

## 2010-02-17 DIAGNOSIS — I509 Heart failure, unspecified: Secondary | ICD-10-CM | POA: Insufficient documentation

## 2010-02-18 ENCOUNTER — Ambulatory Visit: Payer: Self-pay | Admitting: Internal Medicine

## 2010-02-18 LAB — CONVERTED CEMR LAB: Pro B Natriuretic peptide (BNP): 202 pg/mL — ABNORMAL HIGH (ref 0.0–100.0)

## 2010-02-23 ENCOUNTER — Ambulatory Visit: Payer: Self-pay | Admitting: Cardiology

## 2010-02-23 ENCOUNTER — Encounter (INDEPENDENT_AMBULATORY_CARE_PROVIDER_SITE_OTHER): Payer: Self-pay | Admitting: Cardiology

## 2010-03-03 ENCOUNTER — Ambulatory Visit: Payer: Self-pay | Admitting: Internal Medicine

## 2010-03-10 ENCOUNTER — Ambulatory Visit: Payer: Self-pay | Admitting: Cardiology

## 2010-03-10 ENCOUNTER — Ambulatory Visit: Payer: Self-pay

## 2010-03-10 ENCOUNTER — Encounter: Payer: Self-pay | Admitting: Internal Medicine

## 2010-03-10 ENCOUNTER — Ambulatory Visit (HOSPITAL_COMMUNITY): Admission: RE | Admit: 2010-03-10 | Discharge: 2010-03-10 | Payer: Self-pay | Admitting: Internal Medicine

## 2010-03-10 LAB — CONVERTED CEMR LAB: POC INR: 2.9

## 2010-03-31 ENCOUNTER — Ambulatory Visit: Payer: Self-pay | Admitting: Internal Medicine

## 2010-03-31 LAB — CONVERTED CEMR LAB: POC INR: 2.9

## 2010-04-07 ENCOUNTER — Ambulatory Visit: Payer: Self-pay | Admitting: Internal Medicine

## 2010-04-08 ENCOUNTER — Telehealth (INDEPENDENT_AMBULATORY_CARE_PROVIDER_SITE_OTHER): Payer: Self-pay | Admitting: *Deleted

## 2010-04-28 ENCOUNTER — Ambulatory Visit: Payer: Self-pay | Admitting: Cardiology

## 2010-05-12 ENCOUNTER — Ambulatory Visit: Payer: Self-pay | Admitting: Internal Medicine

## 2010-05-18 ENCOUNTER — Ambulatory Visit: Payer: Self-pay | Admitting: Internal Medicine

## 2010-05-18 LAB — CONVERTED CEMR LAB
Bilirubin, Direct: 0.1 mg/dL (ref 0.0–0.3)
Cholesterol: 166 mg/dL (ref 0–200)
HDL: 51.1 mg/dL (ref >39.00)
Total CHOL/HDL Ratio: 3
Total Protein: 7.3 g/dL (ref 6.0–8.3)
Triglycerides: 60 mg/dL (ref 0.0–149.0)

## 2010-05-19 ENCOUNTER — Ambulatory Visit: Payer: Self-pay | Admitting: Internal Medicine

## 2010-06-03 ENCOUNTER — Ambulatory Visit: Payer: Self-pay | Admitting: Internal Medicine

## 2010-06-03 DIAGNOSIS — I4891 Unspecified atrial fibrillation: Secondary | ICD-10-CM | POA: Insufficient documentation

## 2010-06-04 ENCOUNTER — Telehealth: Payer: Self-pay | Admitting: Internal Medicine

## 2010-06-05 ENCOUNTER — Ambulatory Visit: Payer: Self-pay | Admitting: Internal Medicine

## 2010-06-05 LAB — CONVERTED CEMR LAB
ALT: 55 units/L — ABNORMAL HIGH (ref 0–35)
AST: 53 units/L — ABNORMAL HIGH (ref 0–37)
Albumin: 3.7 g/dL (ref 3.5–5.2)
Basophils Absolute: 0 10*3/uL (ref 0.0–0.1)
Eosinophils Absolute: 0.2 10*3/uL (ref 0.0–0.7)
HCT: 36.8 % (ref 36.0–46.0)
Hemoglobin: 11.9 g/dL — ABNORMAL LOW (ref 12.0–15.0)
Lymphocytes Relative: 10.7 % — ABNORMAL LOW (ref 12.0–46.0)
Lymphs Abs: 0.6 10*3/uL — ABNORMAL LOW (ref 0.7–4.0)
MCHC: 32.3 g/dL (ref 30.0–36.0)
MCV: 88.9 fL (ref 78.0–100.0)
Monocytes Absolute: 0.4 10*3/uL (ref 0.1–1.0)
Neutro Abs: 4 10*3/uL (ref 1.4–7.7)
RDW: 17.5 % — ABNORMAL HIGH (ref 11.5–14.6)
Total Protein: 7.2 g/dL (ref 6.0–8.3)

## 2010-06-09 ENCOUNTER — Encounter: Payer: Self-pay | Admitting: Internal Medicine

## 2010-06-09 ENCOUNTER — Encounter (INDEPENDENT_AMBULATORY_CARE_PROVIDER_SITE_OTHER): Payer: Self-pay | Admitting: *Deleted

## 2010-06-15 ENCOUNTER — Ambulatory Visit: Payer: Self-pay | Admitting: Internal Medicine

## 2010-06-15 DIAGNOSIS — IMO0001 Reserved for inherently not codable concepts without codable children: Secondary | ICD-10-CM

## 2010-06-15 DIAGNOSIS — R159 Full incontinence of feces: Secondary | ICD-10-CM

## 2010-06-15 DIAGNOSIS — R32 Unspecified urinary incontinence: Secondary | ICD-10-CM | POA: Insufficient documentation

## 2010-06-22 LAB — CONVERTED CEMR LAB
Hgb A1c MFr Bld: 6.8 % — ABNORMAL HIGH
Vitamin B-12: >1500 pg/mL — ABNORMAL HIGH

## 2010-07-13 ENCOUNTER — Ambulatory Visit: Payer: Self-pay | Admitting: Internal Medicine

## 2010-07-14 ENCOUNTER — Encounter: Payer: Self-pay | Admitting: Internal Medicine

## 2010-08-10 ENCOUNTER — Encounter: Payer: Self-pay | Admitting: Internal Medicine

## 2010-09-23 ENCOUNTER — Ambulatory Visit: Payer: Self-pay | Admitting: Internal Medicine

## 2010-09-23 ENCOUNTER — Ambulatory Visit (HOSPITAL_COMMUNITY)
Admission: RE | Admit: 2010-09-23 | Discharge: 2010-09-23 | Payer: Self-pay | Source: Home / Self Care | Admitting: Internal Medicine

## 2010-09-23 DIAGNOSIS — IMO0002 Reserved for concepts with insufficient information to code with codable children: Secondary | ICD-10-CM | POA: Insufficient documentation

## 2010-09-23 DIAGNOSIS — M25559 Pain in unspecified hip: Secondary | ICD-10-CM

## 2010-10-15 ENCOUNTER — Telehealth: Payer: Self-pay | Admitting: Internal Medicine

## 2010-10-21 ENCOUNTER — Ambulatory Visit: Payer: Self-pay | Admitting: Internal Medicine

## 2010-11-09 ENCOUNTER — Telehealth: Payer: Self-pay | Admitting: Internal Medicine

## 2010-11-11 ENCOUNTER — Ambulatory Visit: Payer: Self-pay | Admitting: Internal Medicine

## 2010-11-12 ENCOUNTER — Ambulatory Visit (HOSPITAL_COMMUNITY): Admission: RE | Admit: 2010-11-12 | Discharge: 2010-11-12 | Payer: Self-pay | Admitting: Internal Medicine

## 2010-11-12 ENCOUNTER — Telehealth: Payer: Self-pay | Admitting: Internal Medicine

## 2010-11-12 ENCOUNTER — Ambulatory Visit: Payer: Self-pay

## 2010-11-12 ENCOUNTER — Encounter: Payer: Self-pay | Admitting: Internal Medicine

## 2010-11-12 ENCOUNTER — Ambulatory Visit: Payer: Self-pay | Admitting: Cardiology

## 2010-11-26 ENCOUNTER — Telehealth: Payer: Self-pay | Admitting: Internal Medicine

## 2011-01-15 ENCOUNTER — Telehealth: Payer: Self-pay | Admitting: Internal Medicine

## 2011-01-19 ENCOUNTER — Other Ambulatory Visit: Payer: Self-pay | Admitting: Obstetrics and Gynecology

## 2011-01-26 NOTE — Miscellaneous (Signed)
Summary: Pradaxa 150mg   Clinical Lists Changes  Medications: Changed medication from WARFARIN SODIUM 2 MG TABS (WARFARIN SODIUM) Use as directed by Anticoagualtion Clinic to PRADAXA 150 MG CAPS (DABIGATRAN ETEXILATE MESYLATE) one by mouth two times a day - Signed Rx of PRADAXA 150 MG CAPS (DABIGATRAN ETEXILATE MESYLATE) one by mouth two times a day;  #60 x 3;  Signed;  Entered by: Dennis Bast, RN, BSN;  Authorized by: Laren Boom, MD, [facility];  Method used: Electronically to [facility] #[id number]*, [address] [place], [place], [place]  [address], Ph: [phone], Fax: [phone]    Prescriptions: PRADAXA 150 MG CAPS (DABIGATRAN ETEXILATE MESYLATE) one by mouth two times a day  #60 x 3   Entered by:   Dennis Bast, RN, BSN   Authorized by:   Laren Boom, MD, [facility]   Signed by:   Dennis Bast, RN, BSN on 06/09/2010   Method used:   Electronically to        [facility]. [phone]* (retail)       [phone] [address].       [place], [place]  [address]       Ph: [phone]       Fax: [phone]   RxID:   [id number]

## 2011-01-26 NOTE — Medication Information (Signed)
Summary: rov.mp  Anticoagulant Therapy  Managed by: Eda Keys, PharmD Referring MD: Sharrell Ku PCP: Marga Melnick, MD Supervising MD: Myrtis Ser MD, Tinnie Gens Indication 1: Atrial Fibrillation (ICD-427.31) Lab Used: [facility] Site: [facility] INR POC 2.9 INR RANGE 2 - 2.5  Dietary changes: no    Health status changes: no    Bleeding/hemorrhagic complications: no    Recent/future hospitalizations: no    Any changes in medication regimen? no    Recent/future dental: no  Any missed doses?: no       Is patient compliant with meds? yes       Allergies: 1)  ! Pcn 2)  ! Diovan 3)  ! Norvasc 4)  ! Tramadol Hcl (Tramadol Hcl)  Anticoagulation Management History:      The patient is taking warfarin and comes in today for a routine follow up visit.  Positive risk factors for bleeding include an age of 75 years or older and presence of serious comorbidities.  Negative risk factors for bleeding include no history of CVA/TIA.  The bleeding index is 'intermediate risk'.  Positive CHADS2 values include History of CHF, History of HTN, Age > 34 years old, and History of Diabetes.  Negative CHADS2 values include Prior Stroke/CVA/TIA.  The start date was 03/15/2007.  Her last INR was 2.2 RATIO.  Anticoagulation responsible provider: Myrtis Ser MD, Tinnie Gens.  INR POC: 2.9.  Cuvette Lot#: [id number].  Exp: 04/2011.    Anticoagulation Management Assessment/Plan:      The patient's current anticoagulation dose is Warfarin sodium 2 mg tabs: Use as directed by Anticoagualtion Clinic.  The target INR is 2.0-3.0.  The next INR is due 03/31/2010.  Anticoagulation instructions were given to patient.  Results were reviewed/authorized by Eda Keys, PharmD.  She was notified by Eda Keys.         Prior Anticoagulation Instructions: INR 2.1  Continue 0.5 tab each Sunday, Tuesday, and Thursday.  Continue 1 tab on all other days.   Recheck in 4 weeks.    Current Anticoagulation Instructions: INR  2.9  Continue taking 1/2 tablet on Sunday, Tuesday, and Thursday and 1 tablet all other days.  Return to clinic in 3 weeks.  Don't forget your greens.

## 2011-01-26 NOTE — Medication Information (Signed)
Summary: Coumadin Clinic  Anticoagulant Therapy  Managed by: Inactive Referring MD: Sharrell Ku PCP: Marga Melnick, MD Supervising MD: Graciela Husbands MD, Viviann Spare Indication 1: Atrial Fibrillation (ICD-427.31) Lab Used: [facility] Site: [facility] INR RANGE 2.0-3.0          Comments: Pt is off Coumadin and on Pradaxa per Dr Ladona Ridgel. Cloyde Reams RN  July 14, 2010 10:40 AM   Allergies: 1)  ! Pcn 2)  ! Diovan 3)  ! Norvasc 4)  ! Tramadol Hcl (Tramadol Hcl)  Anticoagulation Management History:      Positive risk factors for bleeding include an age of 75 years or older and presence of serious comorbidities.  Negative risk factors for bleeding include no history of CVA/TIA.  The bleeding index is 'intermediate risk'.  Positive CHADS2 values include History of CHF, History of HTN, Age > 75 years old, and History of Diabetes.  Negative CHADS2 values include Prior Stroke/CVA/TIA.  The start date was 03/15/2007.  Her last INR was 2.2 RATIO.  Anticoagulation responsible provider: Graciela Husbands MD, Viviann Spare.  Exp: 07/2011.    Anticoagulation Management Assessment/Plan:      The target INR is 2.0-3.0.  The next INR is due 06/16/2010.  Anticoagulation instructions were given to patient.  Results were reviewed/authorized by Inactive.         Prior Anticoagulation Instructions: INR 2.1  Continue taking 1/2 tablet on Sunday, and Tuesday, and Thursday and taking 1 tablet all other days.  Return to clinic in 4 weeks.

## 2011-01-26 NOTE — Assessment & Plan Note (Signed)
Summary: eph/ gd   Primary Provider:  Marga Melnick, MD   History of Present Illness: [name] returns today for followup.  She has been on carvedilol with improvement in her blood pressures.  No c/p or peripheral edema. She has gone back to exercising 3 times a week. No palpitation.  The patient was seen in the hospital several days ago with CHF exacerbation and was treated with IV lasix.  She is improved.  No other complaints.  She denies dietary indiscretion with sodium or medical non-compliance.  Current Medications (verified): 1)  Carvedilol 25 Mg Tabs (Carvedilol) .... Take Two Tablets By Mouth Twice A Day 2)  Glucophage Xr 500 Mg  Tb24 (Metformin Hcl) .... Take One Tablet in The Morning and 2 Tablets  in The Evening 3)  Lantus Solostar 100 Unit/ml  Soln (Insulin Glargine) .Marland KitchenMarland Kitchen. 17 Units Daily 4)  Calcium 600mg  With Vit D .... 1 By Mouth Once Daily 5)  Folic Acid 400mg  .... Take 1 Tab Once Daily 6)  Omega Three 1000mg  .... Take 1 Tab Once Daily 7)  Bd Uf Mini Pen Needles 31gx3/16 .... As Directed 8)  Furosemide 40 Mg Tabs (Furosemide) .... 1/2 Once Daily 9)  Vitamin D3 1000 Unit Tabs (Cholecalciferol) .Marland Kitchen.. 1 Am, 1 Pm 10)  Ferrous Sulfate 325 (65 Fe) Mg Tbec (Ferrous Sulfate) .... 2-Am, 2-Pm 11)  Klor-Con M20 20 Meq Cr-Tabs (Potassium Chloride Crys Cr) .... One Tablet Daily 12)  Amiodarone Hcl 200 Mg Tabs (Amiodarone Hcl) .Marland Kitchen.. 1 By Mouth Once Daily 13)  Warfarin Sodium 2 Mg Tabs (Warfarin Sodium) .... Use As Directed By Anticoagualtion Clinic 14)  Nitroglycerin 0.4 Mg Subl (Nitroglycerin) .... As Needed For Chest Pain 15)  Cyanocobalamin 1000 Mcg/ml Soln (Cyanocobalamin) .Marland Kitchen.. 1 Injection Monthly 16)  Crestor 20 Mg Tabs (Rosuvastatin Calcium) .Marland Kitchen.. 1 Once Daily  ( Check Pt/inr  I Week After This Is Started)  Allergies (verified): 1)  ! Pcn 2)  ! Diovan 3)  ! Norvasc 4)  ! Tramadol Hcl (Tramadol Hcl)  Past History:  Past Medical History: Last updated:  06/05/2009 HYPOPOTASSEMIA (ICD-276.8) ANEMIA-IRON DEFICIENCY (ICD-280.9) CHEST PAIN (ICD-786.50) FATIGUE (ICD-780.79) DYSPNEA/SHORTNESS OF BREATH (ICD-786.09) DIAB W/O MENTION COMP TYPE II/UNS TYPE UNCNTRL (ICD-250.02) MUSCLE PAIN (ICD-729.1) DEGENERATIVE JOINT DISEASE (ICD-715.90) ANEMIA, PERNICIOUS (ICD-281.0) UNSPECIFIED ANEMIA (ICD-285.9) ATRIAL FLUTTER (ICD-427.32) C A D (ICD-414.00) HYPERLIPIDEMIA (ICD-272.2) HYPERTENSION, ESSENTIAL NOS (ICD-401.9) Family Hx of BREAST CANCER, FAMILY HX (ICD-V16.3) Family Hx of TIA (ICD-435.9) FAMILY HISTORY DIABETES 1ST DEGREE RELATIVE (ICD-V18.0) DYSTROPHY, ENDOTHELIAL CORNEAL (ICD-371.57) Family Hx of DIABETES MELLITUS (ICD-250.00)  Past Surgical History: Last updated: 01/08/2009 gallbladder surgery gravid 2 para 2 colonoscopy 6 polyps , one pre-malignant 02/2003 knee surgery colonoscopy 2 polyps 05/2006 (Dr. Elvina Mattes in [place]) cataract surgery OD 03/2006; corneal implant 9/08 Bare metal stent 3/08; ablation for a flutter 5/08, Dr Ladona Ridgel   Review of Systems       The patient complains of dyspnea on exertion.  The patient denies chest pain, syncope, and peripheral edema.    Vital Signs:  Patient profile:   77 year old female Height:      65 inches Weight:      157 pounds BMI:     26.22 Pulse rate:   57 / minute Resp:     16 per minute BP sitting:   149 / 58  (left arm)  Vitals Entered By: Marrion Coy, CNA (February 18, 2010 3:41 PM)  Physical Exam  General:  Appears younger than age,in no acute distress;  alert,appropriate and cooperative throughout examination Head:  normocephalic and atraumatic Eyes:  PERRLA/EOM intact; conjunctiva and lids normal. Mouth:  Teeth, gums and palate normal. Oral mucosa normal. Neck:  Neck supple, no JVD. No masses, thyromegaly or abnormal cervical nodes. Lungs:  Normal respiratory effort, chest expands symmetrically. Lungs are clear to auscultation, no crackles or wheezes. Heart:  regular rhythm  and bradycardia.   Split S1. No NVD, HJR Abdomen:  Bowel sounds positive,abdomen soft and non-tender without masses, organomegaly or hernias noted. Msk:  Back normal, normal gait. Muscle strength and tone normal. Pulses:  R and L carotid,radial,dorsalis pedis and posterior tibial pulses are full and equal bilaterally Extremities:  No clubbing, cyanosis, edema. Homan's negative Neurologic:  alert & oriented X3.     Impression & Recommendations:  Problem # 1:  CHF (ICD-428.0) Her symptoms are improved. I have recommended that she take lasix 40 mg daily and we will obtain a 2D echo.  A low sodium diet is requested. Her updated medication list for this problem includes:    Carvedilol 25 Mg Tabs (Carvedilol) .Marland Kitchen... Take two tablets by mouth twice a day    Furosemide 40 Mg Tabs (Furosemide) ..... Once daily    Amiodarone Hcl 200 Mg Tabs (Amiodarone hcl) .Marland Kitchen... 1 by mouth once daily    Warfarin Sodium 2 Mg Tabs (Warfarin sodium) ..... Use as directed by anticoagualtion clinic    Nitroglycerin 0.4 Mg Subl (Nitroglycerin) .Marland Kitchen... As needed for chest pain  Problem # 2:  HYPERTENSION, ESSENTIAL NOS (ICD-401.9) Her blood pressure appears to be well controlled though it had not been in the past. Her updated medication list for this problem includes:    Carvedilol 25 Mg Tabs (Carvedilol) .Marland Kitchen... Take two tablets by mouth twice a day    Furosemide 40 Mg Tabs (Furosemide) ..... Once daily  Orders: Echocardiogram (Echo)  Problem # 3:  ATRIAL FLUTTER (ICD-427.32) Her atrial fib and flutter have been well controlled on amiodarone. Her updated medication list for this problem includes:    Carvedilol 25 Mg Tabs (Carvedilol) .Marland Kitchen... Take two tablets by mouth twice a day    Amiodarone Hcl 200 Mg Tabs (Amiodarone hcl) .Marland Kitchen... 1 by mouth once daily    Warfarin Sodium 2 Mg Tabs (Warfarin sodium) ..... Use as directed by anticoagualtion clinic  Orders: Echocardiogram (Echo)  Patient Instructions: 1)  Your  physician recommends that you schedule a follow-up appointment in: 4 months wit Dr Ladona Ridgel 2)  Your physician has requested that you have an echocardiogram.  Echocardiography is a painless test that uses sound waves to create images of your heart. It provides your doctor with information about the size and shape of your heart and how well your heart's chambers and valves are working.  This procedure takes approximately one hour. There are no restrictions for this procedure. 3)  Low salt diet 4)  Your physician has recommended you make the following change in your medication: take your Lasix 40mg  once daily

## 2011-01-26 NOTE — Progress Notes (Signed)
Summary: low CBG  Phone Note Call from Patient Call back at Home Phone [phone]   Summary of Call: Patient left message on triage noting that she has been having low CBG reading 2x/week. She takes Metformin 150 in the AM and 300 in the PM and 17 units of insulin. The patient was wondering if she can reduce the Metformin. Please advise.   *Patient will be home after 3:30pm today Initial call taken by: Lucious Groves CMA,  November 09, 2010 10:29 AM  Follow-up for Phone Call        dose of Metformin is 500 mg in am & 1000 in evewith meal. It does not cause hypoglycemia ; it makes insulin more effective.Decrease insulin to 15 units & monitor glucoses. Goal = fasting(am) 90-150 & < 180 two hrs after largest meal. see me if hypoglycemia recurs Follow-up by: Marga Melnick MD,  November 09, 2010 12:41 PM  Additional Follow-up for Phone Call Additional follow up Details #1::        left message on machine to call back to office. Lucious Groves CMA  November 09, 2010 2:52 PM   Patient notified. Lucious Groves CMA  November 10, 2010 8:38 AM     New/Updated Medications: GLUCOPHAGE XR 500 MG  TB24 (METFORMIN HCL) Take one tablet in the morning and 2 tablets  in the evening

## 2011-01-26 NOTE — Assessment & Plan Note (Signed)
Summary: PER CHECK OUT/SF   Primary Provider:  Marga Melnick, MD  CC:  sob.  History of Present Illness: [name] returns today for followup.  She is a pleasant 77 yo woman who looks younger than her stated age with a h/o PAF/persistent atrial fibrillation and an RVR who had been maintained in NSR on amiodarone very nicely.  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  She has improved clinically since we last saw her and has maintained NSR.  Her dyspnea has gone from class 2-3 to class 1-2. She has not been coughing. She denies recurrent palpitations.  She notes that she has had dyspnea when she moves suddenly but is able to participate in water aerobics. No peripheral edema.  Current Medications (verified): 1)  Carvedilol 25 Mg Tabs (Carvedilol) .... Take Two Tablets By Mouth Twice A Day 2)  Glucophage Xr 500 Mg  Tb24 (Metformin Hcl) .... Take One Tablet in The Morning and 2 Tablets  in The Evening 3)  Lantus Solostar 100 Unit/ml  Soln (Insulin Glargine) .Marland KitchenMarland Kitchen. 17 Units Daily 4)  Calcium 600mg  With Vit D .... 1 By Mouth Once Daily 5)  Folic Acid 400mg  .... Take 1 Tab Once Daily 6)  Omega Three 1000mg  .... Take 1 Tab Once Daily 7)  Bd Uf Mini Pen Needles 31gx3/16 .... As Directed 8)  Furosemide 40 Mg Tabs (Furosemide) .... Once Daily 9)  Vitamin D3 1000 Unit Tabs (Cholecalciferol) .... Once Daily 10)  Ferrous Sulfate 325 (65 Fe) Mg Tbec (Ferrous Sulfate) .... 2-Am, 2-Pm 11)  Klor-Con M20 20 Meq Cr-Tabs (Potassium Chloride Crys Cr) .... One Tablet Daily 12)  Nitroglycerin 0.4 Mg Subl (Nitroglycerin) .... As Needed For Chest Pain 13)  Crestor 20 Mg Tabs (Rosuvastatin Calcium) .... Take One Tablet By Mouth Daily. 14)  Spiriva Handihaler 18 Mcg Caps (Tiotropium Bromide Monohydrate) .... Inhale Contents of 1 Capsule Each  Am 15)  Pradaxa 150 Mg Caps (Dabigatran Etexilate Mesylate) .... One By Mouth Bid 16)  Tylenol Arthritis Pain 650 Mg  Cr-Tabs (Acetaminophen) .... As Needed 17)  Ibuprofen 600 Mg Tabs (Ibuprofen) .... As Needed 18)  Meloxicam 7.5 Mg Tabs (Meloxicam) .Marland Kitchen.. 1 Two Times A Day As Needed Pain 19)  Gabapentin 100 Mg Caps (Gabapentin) .Marland Kitchen.. 1 Every 8 Hrs As Needed Leg Pain  Allergies: 1)  ! Pcn 2)  ! Diovan 3)  ! Norvasc 4)  ! Tramadol Hcl (Tramadol Hcl)  Past History:  Past Medical History: Last updated: 06/05/2009 HYPOPOTASSEMIA (ICD-276.8) ANEMIA-IRON DEFICIENCY (ICD-280.9) CHEST PAIN (ICD-786.50) FATIGUE (ICD-780.79) DYSPNEA/SHORTNESS OF BREATH (ICD-786.09) DIAB W/O MENTION COMP TYPE II/UNS TYPE UNCNTRL (ICD-250.02) MUSCLE PAIN (ICD-729.1) DEGENERATIVE JOINT DISEASE (ICD-715.90) ANEMIA, PERNICIOUS (ICD-281.0) UNSPECIFIED ANEMIA (ICD-285.9) ATRIAL FLUTTER (ICD-427.32) C A D (ICD-414.00) HYPERLIPIDEMIA (ICD-272.2) HYPERTENSION, ESSENTIAL NOS (ICD-401.9) Family Hx of BREAST CANCER, FAMILY HX (ICD-V16.3) Family Hx of TIA (ICD-435.9) FAMILY HISTORY DIABETES 1ST DEGREE RELATIVE (ICD-V18.0) DYSTROPHY, ENDOTHELIAL CORNEAL (ICD-371.57) Family Hx of DIABETES MELLITUS (ICD-250.00)  Past Surgical History: Last updated: 01/08/2009 gallbladder surgery gravid 2 para 2 colonoscopy 6 polyps , one pre-malignant 02/2003 knee surgery colonoscopy 2 polyps 05/2006 (Dr. Elvina Mattes in [place]) cataract surgery OD 03/2006; corneal implant 9/08 Bare metal stent 3/08; ablation for a flutter 5/08, Dr Ladona Ridgel   Vital Signs:  Patient profile:   77 year old female Height:      63 inches Weight:      155 pounds BMI:     27.56 Pulse rate:   66 /  minute Resp:     16 per minute BP sitting:   179 / 75  (left arm)  Vitals Entered By: Kem Parkinson (October 21, 2010 10:05 AM)  Physical Exam  General:  well-nourished,in no acute distress; alert,appropriate and cooperative throughout examination Head:  normocephalic and atraumatic Eyes:  PERRLA/EOM intact; conjunctiva and lids normal. Mouth:  Teeth, gums and palate  normal. Oral mucosa normal. Neck:  Neck supple, no JVD. No masses, thyromegaly or abnormal cervical nodes. Lungs:  Normal respiratory effort, chest expands symmetrically. Lungs are clear to auscultation, no crackles or wheezes. BS decreased Heart:  Normal rate and regular rhythm. S1 and S2 normal without gallop, murmur, click, rub.S4 Abdomen:  Bowel sounds positive,abdomen soft and non-tender without masses, organomegaly or hernias noted. Aortic bruit w/o AAA Pulses:  R and L dorsalis pedis and posterior tibial pulses are full and equal bilaterally Extremities:  No clubbing, cyanosis, edema. Good nail health. Pain with ROM L hip Neurologic:  Alert and oriented x 3.   EKG  Procedure date:  10/21/2010  Findings:      Normal sinus rhythm with rate of: 66. First degree AV-Block noted.  Left ventricular hypertrophy.    Impression & Recommendations:  Problem # 1:  ATRIAL FIBRILLATION (ICD-427.31) She continues to maintain NSR.  I expect her to return to atrial fib and will discuss treatment options for her when that occurs. Her updated medication list for this problem includes:    Carvedilol 25 Mg Tabs (Carvedilol) .Marland Kitchen... Take two tablets by mouth twice a day  Orders: Echocardiogram (Echo)  Problem # 2:  CHF (ICD-428.0) Her symptoms may be a bit worse.  I have asked her to repeat her 2D echo. A low sodium diet is discussed. She admits to sodium indiscretion. Her updated medication list for this problem includes:    Carvedilol 25 Mg Tabs (Carvedilol) .Marland Kitchen... Take two tablets by mouth twice a day    Furosemide 40 Mg Tabs (Furosemide) ..... Once daily    Nitroglycerin 0.4 Mg Subl (Nitroglycerin) .Marland Kitchen... As needed for chest pain  Orders: Echocardiogram (Echo)  Problem # 3:  HYPERLIPIDEMIA (ICD-272.2) She will continue her crestor. Her updated medication list for this problem includes:    Crestor 20 Mg Tabs (Rosuvastatin calcium) .Marland Kitchen... Take one tablet by mouth daily.  Patient  Instructions: 1)  Your physician recommends that you schedule a follow-up appointment in: 3-4 months with Dr Ladona Ridgel 2)  Your physician has requested that you have an echocardiogram.  Echocardiography is a painless test that uses sound waves to create images of your heart. It provides your doctor with information about the size and shape of your heart and how well your heart's chambers and valves are working.  This procedure takes approximately one hour. There are no restrictions for this procedure. Prescriptions: PRADAXA 150 MG CAPS (DABIGATRAN ETEXILATE MESYLATE) one by mouth bid  #180 x 3   Entered by:   Dennis Bast, RN, BSN   Authorized by:   Laren Boom, MD, [facility]   Signed by:   Dennis Bast, RN, BSN on 10/21/2010   Method used:   Faxed to ...       [facility] (mail-order)             , [place]         Ph: [phone]       Fax: [phone]   RxID:   [id number]

## 2011-01-26 NOTE — Miscellaneous (Signed)
Summary: Orders Update  Clinical Lists Changes  Orders: Added new Service order of Prescription Created Electronically ([id number]) - Signed 

## 2011-01-26 NOTE — Assessment & Plan Note (Signed)
Summary: B12 SHOT/KDC  Nurse Visit   Allergies: 1)  ! Pcn 2)  ! Diovan 3)  ! Norvasc 4)  ! Tramadol Hcl (Tramadol Hcl)  Medication Administration  Injection # 1:    Medication: Vit B12 1000 mcg    Diagnosis: ANEMIA-IRON DEFICIENCY (ICD-280.9)    Route: IM    Site: R deltoid    Exp Date: 10/28/2011    Lot #: [id number]    Mfr: [employer] Regent    Patient tolerated injection without complications    Given by: Floydene Flock (March 03, 2010 1:54 PM)  Orders Added: 1)  Admin of Therapeutic Inj  intramuscular or subcutaneous [96372] 2)  Vit B12 1000 mcg [J3420]   Medication Administration  Injection # 1:    Medication: Vit B12 1000 mcg    Diagnosis: ANEMIA-IRON DEFICIENCY (ICD-280.9)    Route: IM    Site: R deltoid    Exp Date: 10/28/2011    Lot #: [id number]    Mfr: [employer] Regent    Patient tolerated injection without complications    Given by: Floydene Flock (March 03, 2010 1:54 PM)  Orders Added: 1)  Admin of Therapeutic Inj  intramuscular or subcutaneous [96372] 2)  Vit B12 1000 mcg [J3420]

## 2011-01-26 NOTE — Medication Information (Signed)
Summary: rov/ewj  Anticoagulant Therapy  Managed by: Bethena Midget, RN, BSN Referring MD: Sharrell Ku PCP: Marga Melnick, MD Supervising MD: Excell Seltzer MD, Casimiro Needle Indication 1: Atrial Fibrillation (ICD-427.31) Lab Used: [facility] Site: [facility] INR POC 1.9 INR RANGE 2 - 2.5  Dietary changes: no    Health status changes: no    Bleeding/hemorrhagic complications: no    Recent/future hospitalizations: no    Any changes in medication regimen? no    Recent/future dental: no  Any missed doses?: no       Is patient compliant with meds? yes       Allergies: 1)  ! Pcn 2)  ! Diovan 3)  ! Norvasc 4)  ! Tramadol Hcl (Tramadol Hcl)  Anticoagulation Management History:      The patient is taking warfarin and comes in today for a routine follow up visit.  Positive risk factors for bleeding include an age of 75 years or older and presence of serious comorbidities.  Negative risk factors for bleeding include no history of CVA/TIA.  The bleeding index is 'intermediate risk'.  Positive CHADS2 values include History of HTN, Age > 75 years old, and History of Diabetes.  Negative CHADS2 values include Prior Stroke/CVA/TIA.  The start date was 03/15/2007.  Her last INR was 2.2 RATIO.  Anticoagulation responsible provider: Excell Seltzer MD, Casimiro Needle.  INR POC: 1.9.  Cuvette Lot#: [id number].  Exp: 03/2011.    Anticoagulation Management Assessment/Plan:      The patient's current anticoagulation dose is Warfarin sodium 2 mg tabs: Use as directed by Anticoagualtion Clinic.  The target INR is 2.0-3.0.  The next INR is due 02/23/2010.  Anticoagulation instructions were given to patient.  Results were reviewed/authorized by Bethena Midget, RN, BSN.  She was notified by Bethena Midget, RN, BSN.         Prior Anticoagulation Instructions: INR 2.3  Continue on same dosage 1 tablet daily except 1/2 tablet on Sundays, Tuesdays, and Thursdays.   Recheck in 3 weeks.    Current Anticoagulation Instructions: INR  1.9 Today take extra 1mg s then resume 2mg s daily except 1mg s on Tuesdays, Thursdays and Sundays. Recheck in 3 weeks.

## 2011-01-26 NOTE — Medication Information (Signed)
Summary: rov coumadin - [facility]  Anticoagulant Therapy  Managed by: Eda Keys, PharmD Referring MD: Sharrell Ku PCP: Marga Melnick, MD Supervising MD: Gala Romney MD, Reuel Boom Indication 1: Atrial Fibrillation (ICD-427.31) Lab Used: [facility] Site: [facility] INR RANGE 2.0-3.0  Dietary changes: no    Health status changes: no    Bleeding/hemorrhagic complications: yes       Details: pt has noticed more bruising than normal this week, she rarely ever bruises and bruised multiple times this week.  Recent/future hospitalizations: no    Any changes in medication regimen? no    Recent/future dental: no  Any missed doses?: no       Is patient compliant with meds? yes       Allergies: 1)  ! Pcn 2)  ! Diovan 3)  ! Norvasc 4)  ! Tramadol Hcl (Tramadol Hcl)  Anticoagulation Management History:      The patient is taking warfarin and comes in today for a routine follow up visit.  Positive risk factors for bleeding include an age of 75 years or older and presence of serious comorbidities.  Negative risk factors for bleeding include no history of CVA/TIA.  The bleeding index is 'intermediate risk'.  Positive CHADS2 values include History of CHF, History of HTN, Age > 74 years old, and History of Diabetes.  Negative CHADS2 values include Prior Stroke/CVA/TIA.  The start date was 03/15/2007.  Her last INR was 2.2 RATIO.  Anticoagulation responsible provider: Bensimhon MD, Reuel Boom.  Cuvette Lot#: [id number].  Exp: 07/2011.    Anticoagulation Management Assessment/Plan:      The patient's current anticoagulation dose is Warfarin sodium 2 mg tabs: Use as directed by Anticoagualtion Clinic.  The target INR is 2.0-3.0.  The next INR is due 06/16/2010.  Anticoagulation instructions were given to patient.  Results were reviewed/authorized by Eda Keys, PharmD.  She was notified by Eda Keys.         Prior Anticoagulation Instructions: INR 1.5  Take extra 1 whole tablet today Tue  5/3 then Coumadin 1 tab = 2mg  on Mon, Wed, Fri, Sat 1/2 tab = 1mg  on Sun, Tue, Thur  Current Anticoagulation Instructions: INR 2.1  Continue taking 1/2 tablet on Sunday, and Tuesday, and Thursday and taking 1 tablet all other days.  Return to clinic in 4 weeks.

## 2011-01-26 NOTE — Medication Information (Signed)
Summary: ROV/TM  Anticoagulant Therapy  Managed by: Shelby Dubin, PharmD, BCPS, CPP Referring MD: Sharrell Ku PCP: Marga Melnick, MD Supervising MD: Shirlee Latch MD, Freida Busman Indication 1: Atrial Fibrillation (ICD-427.31) Lab Used: [facility] Site: [facility] INR POC 2.1 INR RANGE 2 - 2.5  Dietary changes: no    Health status changes: no    Bleeding/hemorrhagic complications: no    Recent/future hospitalizations: no    Any changes in medication regimen? yes       Details: crestor started 10 days ago  Recent/future dental: no  Any missed doses?: no       Is patient compliant with meds? yes       Current Medications (verified): 1)  Carvedilol 25 Mg Tabs (Carvedilol) .... Take Two Tablets By Mouth Twice A Day 2)  Glucophage Xr 500 Mg  Tb24 (Metformin Hcl) .... Take One Tablet in The Morning and 2 Tablets  in The Evening 3)  Lantus Solostar 100 Unit/ml  Soln (Insulin Glargine) .Marland KitchenMarland Kitchen. 17 Units Daily 4)  Calcium 600mg  With Vit D .... 1 By Mouth Once Daily 5)  Folic Acid 400mg  .... Take 1 Tab Once Daily 6)  Omega Three 1000mg  .... Take 1 Tab Once Daily 7)  Bd Uf Mini Pen Needles 31gx3/16 .... As Directed 8)  Furosemide 40 Mg Tabs (Furosemide) .... Once Daily 9)  Vitamin D3 1000 Unit Tabs (Cholecalciferol) .Marland Kitchen.. 1 Am, 1 Pm 10)  Ferrous Sulfate 325 (65 Fe) Mg Tbec (Ferrous Sulfate) .... 2-Am, 2-Pm 11)  Klor-Con M20 20 Meq Cr-Tabs (Potassium Chloride Crys Cr) .... One Tablet Daily 12)  Amiodarone Hcl 200 Mg Tabs (Amiodarone Hcl) .Marland Kitchen.. 1 By Mouth Once Daily 13)  Warfarin Sodium 2 Mg Tabs (Warfarin Sodium) .... Use As Directed By Anticoagualtion Clinic 14)  Nitroglycerin 0.4 Mg Subl (Nitroglycerin) .... As Needed For Chest Pain 15)  Cyanocobalamin 1000 Mcg/ml Soln (Cyanocobalamin) .Marland Kitchen.. 1 Injection Monthly 16)  Crestor 20 Mg Tabs (Rosuvastatin Calcium) .Marland Kitchen.. 1 Once Daily  ( Check Pt/inr  I Week After This Is Started)  Allergies (verified): 1)  ! Pcn 2)  ! Diovan 3)  ! Norvasc 4)  !  Tramadol Hcl (Tramadol Hcl)  Anticoagulation Management History:      The patient is taking warfarin and comes in today for a routine follow up visit.  Positive risk factors for bleeding include an age of 4 years or older and presence of serious comorbidities.  Negative risk factors for bleeding include no history of CVA/TIA.  The bleeding index is 'intermediate risk'.  Positive CHADS2 values include History of CHF, History of HTN, Age > 88 years old, and History of Diabetes.  Negative CHADS2 values include Prior Stroke/CVA/TIA.  The start date was 03/15/2007.  Her last INR was 2.2 RATIO.  Anticoagulation responsible provider: Shirlee Latch MD, Willman Cuny.  INR POC: 2.1.  Cuvette Lot#: [id number].  Exp: 04/2011.    Anticoagulation Management Assessment/Plan:      The patient's current anticoagulation dose is Warfarin sodium 2 mg tabs: Use as directed by Anticoagualtion Clinic.  The target INR is 2.0-3.0.  The next INR is due 03/16/2010.  Anticoagulation instructions were given to patient.  Results were reviewed/authorized by Shelby Dubin, PharmD, BCPS, CPP.  She was notified by Shelby Dubin PharmD, BCPS, CPP.         Prior Anticoagulation Instructions: INR 1.9 Today take extra 1mg s then resume 2mg s daily except 1mg s on Tuesdays, Thursdays and Sundays. Recheck in 3 weeks.   Current Anticoagulation Instructions: INR 2.1  Continue 0.5 tab each Sunday, Tuesday, and Thursday.  Continue 1 tab on all other days.   Recheck in 4 weeks.

## 2011-01-26 NOTE — Progress Notes (Signed)
Summary: refill meds  Phone Note Call from Patient Call back at Home Phone [phone] Message from:  Patient on November 26, 2010 12:32 PM  Refills Requested: Medication #1:  PRADAXA 150 MG CAPS one by mouth bid [phone]- [facility].    Method Requested: Fax to Local Pharmacy Initial call taken by: Lorne Skeens,  November 26, 2010 12:33 PM Caller: Patient Reason for Call: Talk to Nurse Summary of Call: per pt calling. [facility] given pt a 60 day free of praxada b/c they mess up her order. it need to be called in by our office.  Initial call taken by: Lorne Skeens,  November 26, 2010 12:33 PM    New/Updated Medications: PRADAXA 150 MG CAPS (DABIGATRAN ETEXILATE MESYLATE) one by mouth bid Prescriptions: PRADAXA 150 MG CAPS (DABIGATRAN ETEXILATE MESYLATE) one by mouth bid  #120 x 0   Entered by:   Laurance Flatten CMA   Authorized by:   Laren Boom, MD, [facility]   Signed by:   Laurance Flatten CMA on 11/26/2010   Method used:   Electronically to        [facility]. [phone]* (retail)       [phone]       [place], [place]  [address]       Ph: [phone]       Fax: [phone]   RxID:   [id number] PRADAXA 150 MG CAPS (DABIGATRAN ETEXILATE MESYLATE) one by mouth bid  #120 x 0   Entered by:   Laurance Flatten CMA   Authorized by:   Laren Boom, MD, [facility]   Signed by:   Laurance Flatten CMA on 11/26/2010   Method used:   Faxed to ...       [facility])       [address]       [address]       [place], [place]  [address]       Ph: [phone] or [phone]       Fax: [phone]   RxID:   [id number]

## 2011-01-26 NOTE — Medication Information (Signed)
Summary: rov/eac  Anticoagulant Therapy  Managed by: Bethena Midget, RN, BSN Referring MD: Sharrell Ku PCP: Marga Melnick, MD Supervising MD: Graciela Husbands MD, Viviann Spare Indication 1: Atrial Fibrillation (ICD-427.31) Lab Used: [facility] Site: [facility] INR POC 2.9 INR RANGE 2.0-3.0  Dietary changes: no    Health status changes: no    Bleeding/hemorrhagic complications: no    Recent/future hospitalizations: no    Any changes in medication regimen? no    Recent/future dental: no  Any missed doses?: no       Is patient compliant with meds? yes       Allergies: 1)  ! Pcn 2)  ! Diovan 3)  ! Norvasc 4)  ! Tramadol Hcl (Tramadol Hcl)  Anticoagulation Management History:      The patient is taking warfarin and comes in today for a routine follow up visit.  Positive risk factors for bleeding include an age of 75 years or older and presence of serious comorbidities.  Negative risk factors for bleeding include no history of CVA/TIA.  The bleeding index is 'intermediate risk'.  Positive CHADS2 values include History of CHF, History of HTN, Age > 75 years old, and History of Diabetes.  Negative CHADS2 values include Prior Stroke/CVA/TIA.  The start date was 03/15/2007.  Her last INR was 2.2 RATIO.  Anticoagulation responsible provider: Graciela Husbands MD, Viviann Spare.  INR POC: 2.9.  Cuvette Lot#: [id number].  Exp: 04/2011.    Anticoagulation Management Assessment/Plan:      The patient's current anticoagulation dose is Warfarin sodium 2 mg tabs: Use as directed by Anticoagualtion Clinic.  The target INR is 2.0-3.0.  The next INR is due 04/28/2010.  Anticoagulation instructions were given to patient.  Results were reviewed/authorized by Bethena Midget, RN, BSN.  She was notified by Bethena Midget, RN, BSN.         Prior Anticoagulation Instructions: INR 2.9  Continue taking 1/2 tablet on Sunday, Tuesday, and Thursday and 1 tablet all other days.  Return to clinic in 3 weeks.  Don't forget your greens.  Current  Anticoagulation Instructions: INR 2.9 Continue 2mg s daily except 1mg s on Tuesdays, Thursdays and Sundays. Recheck in 4 weeks.

## 2011-01-26 NOTE — Assessment & Plan Note (Signed)
Summary: 3 mo. f/u - jr   Vital Signs:  Patient profile:   77 year old female Height:      65 inches Weight:      161.13 pounds BMI:     26.91 Pulse rate:   57 / minute Resp:     16 per minute BP sitting:   150 / 60  Vitals Entered By: Kandice Hams (February 10, 2010 3:00 PM) CC: 3 month folllowup labs   Primary Care Provider:  Marga Melnick, MD  CC:  3 month folllowup labs.  History of Present Illness: FBS not checked ; weight stable. No hypoglycemia. Walking 20 min 3X/week w/o symptoms. Restricting white carbs . Labs reviewed & risks discussed. LDL was 144.6 on Pravastatin 40 mg at bedtime   Allergies: 1)  ! Pcn 2)  ! Diovan 3)  ! Norvasc 4)  ! Tramadol Hcl (Tramadol Hcl)  Review of Systems General:  Denies fatigue. ENT:  Last exam 3 mos ago; no retinopathy. CV:  Denies chest pain or discomfort, leg cramps with exertion, lightheadness, near fainting, and shortness of breath with exertion. Derm:  Denies poor wound healing. Neuro:  Denies numbness and tingling. Endo:  Denies excessive hunger, excessive thirst, and excessive urination.  Physical Exam  General:  well-nourished; alert,appropriate and cooperative throughout examination Lungs:  Normal respiratory effort, chest expands symmetrically. Lungs are clear to auscultation, no crackles or wheezes. Heart:  regular rhythm and bradycardia.   Incerased S2 Pulses:  R and L carotid,radial,dorsalis pedis and posterior tibial pulses are full and equal bilaterally Extremities:  No clubbing, cyanosis, edema. Neurologic:  alert & oriented X3.   Skin:  Intact without suspicious lesions or rashes Psych:  memory intact for recent and remote, normally interactive, and good eye contact.     Impression & Recommendations:  Problem # 1:  DIABETES MELLITUS, CONTROLLED (ICD-250.00)  Her updated medication list for this problem includes:    Glucophage Xr 500 Mg Tb24 (Metformin hcl) .Marland Kitchen... Take one tablet in the morning and 2  tablets  in the evening    Lantus Solostar 100 Unit/ml Soln (Insulin glargine) .Marland KitchenMarland KitchenMarland KitchenMarland Kitchen 17 units daily  Problem # 2:  HYPERLIPIDEMIA (ICD-272.2)  The following medications were removed from the medication list:    Pravastatin Sodium 40 Mg Tabs (Pravastatin sodium) .Marland Kitchen... 1 at bedtime Her updated medication list for this problem includes:    Crestor 20 Mg Tabs (Rosuvastatin calcium) .Marland Kitchen... 1 once daily  ( check pt/inr  i week after this is started)  Problem # 3:  HYPERTENSION, ESSENTIAL NOS (ICD-401.9)  Her updated medication list for this problem includes:    Carvedilol 25 Mg Tabs (Carvedilol) .Marland Kitchen... Take two tablets by mouth twice a day    Furosemide 40 Mg Tabs (Furosemide) .Marland Kitchen... 1/2 once daily  Complete Medication List: 1)  Carvedilol 25 Mg Tabs (Carvedilol) .... Take two tablets by mouth twice a day 2)  Glucophage Xr 500 Mg Tb24 (Metformin hcl) .... Take one tablet in the morning and 2 tablets  in the evening 3)  Lantus Solostar 100 Unit/ml Soln (Insulin glargine) .Marland KitchenMarland KitchenMarland Kitchen 17 units daily 4)  Calcium 600mg  With Vit D  .... 1 by mouth once daily 5)  Folic Acid 400mg   .... Take 1 tab once daily 6)  Omega Three 1000mg   .... Take 1 tab once daily 7)  Bd Uf Mini Pen Needles 31gx3/16  .... As directed 8)  Furosemide 40 Mg Tabs (Furosemide) .... 1/2 once daily 9)  Vitamin D3 1000  Unit Tabs (Cholecalciferol) .Marland Kitchen.. 1 am, 1 pm 10)  Ferrous Sulfate 325 (65 Fe) Mg Tbec (Ferrous sulfate) .... 2-am, 2-pm 11)  Klor-con M20 20 Meq Cr-tabs (Potassium chloride crys cr) .... One tablet daily 12)  Amiodarone Hcl 200 Mg Tabs (Amiodarone hcl) .Marland Kitchen.. 1 by mouth once daily 13)  Warfarin Sodium 2 Mg Tabs (Warfarin sodium) .... Use as directed by anticoagualtion clinic 14)  Nitroglycerin 0.4 Mg Subl (Nitroglycerin) .... As needed for chest pain 15)  Cyanocobalamin 1000 Mcg/ml Soln (Cyanocobalamin) .Marland Kitchen.. 1 injection monthly 16)  Crestor 20 Mg Tabs (Rosuvastatin calcium) .Marland Kitchen.. 1 once daily  ( check pt/inr  i week after this  is started)  Patient Instructions: 1)  Please schedule a follow-up appointment in 3 months. 2)  Hepatic Panel prior to visit, ICD-9: 3)  Lipid Panel prior to visit, ICD-9: 4)  HbgA1C prior to visit, ICD-9: 5)  Check your Blood Pressure regularly. If it is above: 140/90 ON AVERAGE you should make an appointment. Prescriptions: CRESTOR 20 MG TABS (ROSUVASTATIN CALCIUM) 1 once daily  ( check PT/INR  i week after this is started)  #30 x 2   Entered and Authorized by:   Marga Melnick MD   Signed by:   Marga Melnick MD on 02/10/2010   Method used:   Faxed to ...       [facility]. [phone]* (retail)       [phone]       [place], [place]  [address]       Ph: [phone]       Fax: [phone]   RxID:   [id number]

## 2011-01-26 NOTE — Medication Information (Signed)
Summary: Letter Regarding Carvedilol & Asthma/[facility]  Letter Regarding Carvedilol & Asthma/[facility]   Imported By: Lanelle Bal 08/14/2010 12:37:31  _____________________________________________________________________  External Attachment:    Type:   Image     Comment:   External Document

## 2011-01-26 NOTE — Progress Notes (Signed)
Summary: refill  Phone Note Refill Request Message from:  Fax from Pharmacy on February 11, 2010 11:45 AM  metformin hcl er tabs [facility] fax [phone]   Method Requested: Mail to Pharmacy Next Appointment Scheduled: lqab appt march 8,2011 Initial call taken by: Barb Merino,  February 11, 2010 11:46 AM    Prescriptions: GLUCOPHAGE XR 500 MG  TB24 (METFORMIN HCL) Take one tablet in the morning and 2 tablets  in the evening  #180 x 0   Entered by:   Shonna Chock   Authorized by:   Marga Melnick MD   Signed by:   Shonna Chock on 02/11/2010   Method used:   Print then Give to Patient   RxID:   [id number]

## 2011-01-26 NOTE — Assessment & Plan Note (Signed)
Summary: per check out/sf   Visit Type:  Follow-up Primary Provider:  Marga Melnick, MD   History of Present Illness: [name] returns today for followup.  She is a pleasant 77 yo woman who looks younger than her stated age with a h/o PAF/persistent atrial fibrillation and an RVR who has been maintained in NSR on amiodarone very nicely.  The patient also has a h/o CHF and mild LV dysfunction.  She notes today that she has had a dry cough and some increased dyspnea.  She states that she does not feel just right.  No palpitations.  Current Medications (verified): 1)  Carvedilol 25 Mg Tabs (Carvedilol) .... Take Two Tablets By Mouth Twice A Day 2)  Glucophage Xr 500 Mg  Tb24 (Metformin Hcl) .... Take One Tablet in The Morning and 2 Tablets  in The Evening 3)  Lantus Solostar 100 Unit/ml  Soln (Insulin Glargine) .Marland KitchenMarland Kitchen. 17 Units Daily 4)  Calcium 600mg  With Vit D .... 1 By Mouth Once Daily 5)  Folic Acid 400mg  .... Take 1 Tab Once Daily 6)  Omega Three 1000mg  .... Take 1 Tab Once Daily 7)  Bd Uf Mini Pen Needles 31gx3/16 .... As Directed 8)  Furosemide 40 Mg Tabs (Furosemide) .... Once Daily 9)  Vitamin D3 1000 Unit Tabs (Cholecalciferol) .... Once Daily 10)  Ferrous Sulfate 325 (65 Fe) Mg Tbec (Ferrous Sulfate) .... 2-Am, 2-Pm 11)  Klor-Con M20 20 Meq Cr-Tabs (Potassium Chloride Crys Cr) .... One Tablet Daily 12)  Amiodarone Hcl 200 Mg Tabs (Amiodarone Hcl) .Marland Kitchen.. 1 By Mouth Once Daily 13)  Warfarin Sodium 2 Mg Tabs (Warfarin Sodium) .... Use As Directed By Anticoagualtion Clinic 14)  Nitroglycerin 0.4 Mg Subl (Nitroglycerin) .... As Needed For Chest Pain 15)  Cyanocobalamin 1000 Mcg/ml Soln (Cyanocobalamin) .Marland Kitchen.. 1 Injection Monthly 16)  Crestor 20 Mg Tabs (Rosuvastatin Calcium) .Marland Kitchen.. 1 Once Daily  ( Check Pt/inr  I Week After This Is Started) 17)  Crestor 20 Mg Tabs (Rosuvastatin Calcium) .... Take One Tablet By Mouth Daily. 18)  Ionic-Fizz .... Daily  Allergies (verified): 1)  ! Pcn 2)   ! Diovan 3)  ! Norvasc 4)  ! Tramadol Hcl (Tramadol Hcl)  Past History:  Past Medical History: Last updated: 06/05/2009 HYPOPOTASSEMIA (ICD-276.8) ANEMIA-IRON DEFICIENCY (ICD-280.9) CHEST PAIN (ICD-786.50) FATIGUE (ICD-780.79) DYSPNEA/SHORTNESS OF BREATH (ICD-786.09) DIAB W/O MENTION COMP TYPE II/UNS TYPE UNCNTRL (ICD-250.02) MUSCLE PAIN (ICD-729.1) DEGENERATIVE JOINT DISEASE (ICD-715.90) ANEMIA, PERNICIOUS (ICD-281.0) UNSPECIFIED ANEMIA (ICD-285.9) ATRIAL FLUTTER (ICD-427.32) C A D (ICD-414.00) HYPERLIPIDEMIA (ICD-272.2) HYPERTENSION, ESSENTIAL NOS (ICD-401.9) Family Hx of BREAST CANCER, FAMILY HX (ICD-V16.3) Family Hx of TIA (ICD-435.9) FAMILY HISTORY DIABETES 1ST DEGREE RELATIVE (ICD-V18.0) DYSTROPHY, ENDOTHELIAL CORNEAL (ICD-371.57) Family Hx of DIABETES MELLITUS (ICD-250.00)  Past Surgical History: Last updated: 01/08/2009 gallbladder surgery gravid 2 para 2 colonoscopy 6 polyps , one pre-malignant 02/2003 knee surgery colonoscopy 2 polyps 05/2006 (Dr. Elvina Mattes in [place]) cataract surgery OD 03/2006; corneal implant 9/08 Bare metal stent 3/08; ablation for a flutter 5/08, Dr Ladona Ridgel   Review of Systems       The patient complains of dyspnea on exertion.  The patient denies chest pain, syncope, and peripheral edema.    Vital Signs:  Patient profile:   77 year old female Height:      65 inches Weight:      159 pounds BMI:     26.55 O2 Sat:      96 % Pulse rate:   54 / minute BP sitting:   138 /  64  (left arm)  Vitals Entered By: Laurance Flatten CMA (June 03, 2010 12:18 PM)  Physical Exam  General:  Appears younger than age,in no acute distress; alert,appropriate and cooperative throughout examination Head:  normocephalic and atraumatic Eyes:  PERRLA/EOM intact; conjunctiva and lids normal. Mouth:  Teeth, gums and palate normal. Oral mucosa normal. Neck:  Neck supple, no JVD. No masses, thyromegaly or abnormal cervical nodes. Lungs:  Normal respiratory effort,  chest expands symmetrically. Lungs are clear to auscultation, no crackles or wheezes. Heart:  regular rhythm and bradycardia.   Split S1. No NVD, HJR Abdomen:  Bowel sounds positive,abdomen soft and non-tender without masses, organomegaly or hernias noted. Msk:  Back normal, normal gait. Muscle strength and tone normal. Pulses:  R and L carotid,radial,dorsalis pedis and posterior tibial pulses are full and equal bilaterally Extremities:  No clubbing, cyanosis, edema. Homan's negative Neurologic:  alert & oriented X3.     Impression & Recommendations:  Problem # 1:  DYSPNEA/SHORTNESS OF BREATH (ICD-786.09) The etiology is unclear but as she has maintained NSR and has been on amiodarone, I think we need to be sure that she is not having amiodarone lung toxicity and I have asked that she have some labs drawn including and ESR and Pulmonary function testing. Her updated medication list for this problem includes:    Carvedilol 25 Mg Tabs (Carvedilol) .Marland Kitchen... Take two tablets by mouth twice a day    Furosemide 40 Mg Tabs (Furosemide) ..... Once daily  Orders: Pulmonary Function Test (PFT)  Problem # 2:  HYPERTENSION, ESSENTIAL NOS (ICD-401.9) Her blood pressure is under much better control on high dose carvedilol.  A low sodium diet is recommended. Her updated medication list for this problem includes:    Carvedilol 25 Mg Tabs (Carvedilol) .Marland Kitchen... Take two tablets by mouth twice a day    Furosemide 40 Mg Tabs (Furosemide) ..... Once daily  Orders: TLB-CBC Platelet - w/Differential (85025-CBCD) TLB-TSH (Thyroid Stimulating Hormone) (84443-TSH) TLB-Sedimentation Rate (ESR) (85652-ESR) TLB-Hepatic/Liver Function Pnl (80076-HEPATIC)  Problem # 3:  ATRIAL FIBRILLATION (ICD-427.31) Her atrial arrhythmias have been well controlled.  Continue meds as below though he may need to have his amiodarone stopped if he has any evidence of lung toxicity. Her updated medication list for this problem  includes:    Carvedilol 25 Mg Tabs (Carvedilol) .Marland Kitchen... Take two tablets by mouth twice a day    Amiodarone Hcl 200 Mg Tabs (Amiodarone hcl) .Marland Kitchen... 1 by mouth once daily    Warfarin Sodium 2 Mg Tabs (Warfarin sodium) ..... Use as directed by anticoagualtion clinic  Patient Instructions: 1)  Your physician recommends that you schedule a follow-up appointment in: 6 weeks with Dr Ladona Ridgel 2)  Your physician recommends that you return for lab work today 3)  Your physician has recommended that you have a pulmonary function test.  Pulmonary Function Tests are a group of tests that measure how well air moves in and out of your lungs.

## 2011-01-26 NOTE — Assessment & Plan Note (Signed)
Summary: pt to see dr before med refill/ b-12/cbs   Vital Signs:  Patient profile:   77 year old female Height:      63.5 inches Weight:      159.2 pounds BMI:     27.86 Temp:     98.2 degrees F oral Pulse rate:   64 / minute Resp:     17 per minute BP sitting:   140 / 60  (left arm) Cuff size:   large  Vitals Entered By: Shonna Chock (June 15, 2010 11:08 AM) CC: Yearly follow-up on meds , Type 2 diabetes mellitus follow-up Comments REVIEWED MED LIST, PATIENT AGREED DOSE AND INSTRUCTION CORRECT    Primary Care Provider:  Marga Melnick, MD  CC:  Yearly follow-up on meds  and Type 2 diabetes mellitus follow-up.  History of Present Illness: [name] has had DOE  X 4 weeks; Dr Ladona Ridgel D/Ced Amiodarone. His note was & PFTs were reviewed Moderate OAD on PFTs w/o B-D response. DOE has minimally  improved  off Amiodarone.  Type 2 Diabetes Mellitus Follow-Up      This is a 77 year old woman who presents for Type 2 diabetes mellitus follow-up.  The patient denies polyuria, polydipsia, blurred vision, self managed hypoglycemia, weight loss, weight gain, and numbness of extremities.  The patient denies the following symptoms: neuropathic pain, chest pain, vomiting, orthostatic symptoms, poor wound healing, intermittent claudication, vision loss,  due to DOE, and monitoring blood glucose.  The patient has been measuring capillary blood glucose before breakfast.  Since the last visit, the patient reports having had eye care by an ophthalmologist and no foot care.  Complications from diabetes include ASCVD.    Preventive Screening-Counseling & Management  Alcohol-Tobacco     Smoking Status: quit > 6 months     Packs/Day: <0.25     Year Started: 1961     Year Quit: 1965  Allergies: 1)  ! Pcn 2)  ! Diovan 3)  ! Norvasc 4)  ! Tramadol Hcl (Tramadol Hcl)  Social History: Smoking Status:  quit > 6 months Packs/Day:  <0.25  Review of Systems General:  Complains of sweats; Hot  flashes. ENT:  Denies ear discharge and earache; Pressure in L ear . No purulent  secretions. CV:  Denies difficulty breathing at night, difficulty breathing while lying down, leg cramps with exertion, swelling of feet, and swelling of hands. Resp:  Complains of cough; denies coughing up blood, shortness of breath, sputum productive, and wheezing. GI:  Occasional stool incontinence, "1-2X /month". GU:  Denies discharge, dysuria, and hematuria; Urgency . MS:  Complains of low back pain and muscle aches; denies muscle weakness. Neuro:  Denies brief paralysis, numbness, tingling, and weakness.  Physical Exam  General:  Appears younger than age,well-nourished,in no acute distress; alert,appropriate and cooperative throughout examination Ears:  External ear exam shows no significant lesions or deformities.  Otoscopic examination reveals clear canals, tympanic membranes are intact bilaterally without bulging, retraction, inflammation or discharge. Hearing is grossly normal bilaterally. Lungs:  Normal respiratory effort, chest expands symmetrically. Lungs are clear to auscultation, no crackles or wheezes. BS decreased Heart:  Normal rate and regular rhythm. S1 and S2 normal without gallop, murmur, click, rub.S4 Pulses:  R and L carotid,radial,dorsalis pedis and posterior tibial pulses are full and equal bilaterally Extremities:  No clubbing, cyanosis, edema. OA finger deformities. Good nail health Neurologic:  alert & oriented X3, strength normal in all extremities, sensation intact to light touch over feet , gait  normal, and DTRs symmetrical and normal.   Skin:  Intact without suspicious lesions or rashes Cervical Nodes:  No lymphadenopathy noted Axillary Nodes:  No palpable lymphadenopathy Psych:  memory intact for recent and remote, normally interactive, and good eye contact.     Impression & Recommendations:  Problem # 1:  DYSPNEA/SHORTNESS OF BREATH (ICD-786.09)  Her updated medication list  for this problem includes:    Carvedilol 25 Mg Tabs (Carvedilol) .Marland Kitchen... Take two tablets by mouth twice a day    Furosemide 40 Mg Tabs (Furosemide) ..... Once daily    Spiriva Handihaler 18 Mcg Caps (Tiotropium bromide monohydrate) ..... Inhale contents of 1 capsule each  am  Problem # 2:  DIABETES MELLITUS, UNCONTROLLED (ICD-250.02)  Her updated medication list for this problem includes:    Glucophage Xr 500 Mg Tb24 (Metformin hcl) .Marland Kitchen... Take one tablet in the morning and 2 tablets  in the evening    Lantus Solostar 100 Unit/ml Soln (Insulin glargine) .Marland KitchenMarland KitchenMarland KitchenMarland Kitchen 17 units daily  Orders: Venipuncture (62831) TLB-A1C / Hgb A1C (Glycohemoglobin) (83036-A1C)  Problem # 3:  FULL INCONTINENCE OF FECES (ICD-787.60)  Problem # 4:  URINARY INCONTINENCE (ICD-788.30)  Problem # 5:  MUSCLE PAIN (ICD-729.1)  Orders: Venipuncture (51761) T-Vitamin D (25-Hydroxy) (60737-10626)  Complete Medication List: 1)  Carvedilol 25 Mg Tabs (Carvedilol) .... Take two tablets by mouth twice a day 2)  Glucophage Xr 500 Mg Tb24 (Metformin hcl) .... Take one tablet in the morning and 2 tablets  in the evening 3)  Lantus Solostar 100 Unit/ml Soln (Insulin glargine) .Marland KitchenMarland KitchenMarland Kitchen 17 units daily 4)  Calcium 600mg  With Vit D  .... 1 by mouth once daily 5)  Folic Acid 400mg   .... Take 1 tab once daily 6)  Omega Three 1000mg   .... Take 1 tab once daily 7)  Bd Uf Mini Pen Needles 31gx3/16  .... As directed 8)  Furosemide 40 Mg Tabs (Furosemide) .... Once daily 9)  Vitamin D3 1000 Unit Tabs (Cholecalciferol) .... Once daily 10)  Ferrous Sulfate 325 (65 Fe) Mg Tbec (Ferrous sulfate) .... 2-am, 2-pm 11)  Klor-con M20 20 Meq Cr-tabs (Potassium chloride crys cr) .... One tablet daily 12)  Pradaxa 150 Mg Caps (Dabigatran etexilate mesylate) .... One by mouth two times a day 13)  Nitroglycerin 0.4 Mg Subl (Nitroglycerin) .... As needed for chest pain 14)  Cyanocobalamin 1000 Mcg/ml Soln (Cyanocobalamin) .Marland Kitchen.. 1 injection monthly 15)   Crestor 20 Mg Tabs (Rosuvastatin calcium) .Marland Kitchen.. 1 once daily  ( check pt/inr  i week after this is started) 16)  Crestor 20 Mg Tabs (Rosuvastatin calcium) .... Take one tablet by mouth daily. 17)  Ionic-fizz  .... Daily 18)  Spiriva Handihaler 18 Mcg Caps (Tiotropium bromide monohydrate) .... Inhale contents of 1 capsule each  am  Other Orders: Vit B12 1000 mcg (J3420) Admin of Therapeutic Inj  intramuscular or subcutaneous (94854) Tdap => 70yrs IM (62703) Admin 1st Vaccine (50093) TLB-B12, Serum-Total ONLY (81829-H37)  Patient Instructions: 1)  Please keep a diary of possible food triggers for bowel changes. Prescriptions: SPIRIVA HANDIHALER 18 MCG CAPS (TIOTROPIUM BROMIDE MONOHYDRATE) inhale contents of 1 capsule each  am  #1 month x 5   Entered and Authorized by:   Marga Melnick MD   Signed by:   Marga Melnick MD on 06/15/2010   Method used:     RxID:   [id number] CRESTOR 20 MG TABS (ROSUVASTATIN CALCIUM) 1 once daily  ( check PT/INR  i week after this is started)  #  90 x 1   Entered and Authorized by:   Marga Melnick MD   Signed by:   Marga Melnick MD on 06/15/2010   Method used:     RxID:   [id number]    Immunizations Administered:  Tetanus Vaccine:    Vaccine Type: Tdap    Site: right deltoid    Mfr: GlaxoSmithKline    Dose: 0.5 ml    Route: IM    Given by: Chrae Malloy    Exp. Date: 03/20/2012    Lot #: [id number]    VIS given: 11/14/07 version given June 15, 2010.    Medication Administration  Injection # 1:    Medication: Vit B12 1000 mcg    Diagnosis: ANEMIA, PERNICIOUS (ICD-281.0)    Route: IM    Site: L deltoid    Exp Date: 03/2012    Lot #: [id number]    Mfr: [employer]    Patient tolerated injection without complications    Given by: Shonna Chock (June 15, 2010 11:17 AM)  Orders Added: 1)  Vit B12 1000 mcg [J3420] 2)  Admin of Therapeutic Inj  intramuscular or subcutaneous [96372] 3)  Tdap => 65yrs IM [90715] 4)  Admin 1st  Vaccine [90471] 5)  Est. Patient Level IV [95284] 6)  Venipuncture [36415] 7)  TLB-B12, Serum-Total ONLY [82607-B12] 8)  T-Vitamin D (25-Hydroxy) [13244-01027] 9)  TLB-A1C / Hgb A1C (Glycohemoglobin) [83036-A1C]

## 2011-01-26 NOTE — Progress Notes (Signed)
Summary: question about Pradaxa  Phone Note Call from Patient Call back at Home Phone [phone]   Caller: Patient Summary of Call: Pt have question about Pradaxa Initial call taken by: Judie Grieve,  October 15, 2010 3:17 PM  Follow-up for Phone Call        pt has Pradaxa that will last her until her apponintment on Wen.  She is goin to talk to Dr Ladona Ridgel about the cost of Pradaxa and not being able to afford it Dennis Bast, RN, BSN  October 15, 2010 4:08 PM

## 2011-01-26 NOTE — Progress Notes (Signed)
Summary: reaction to crestor   Phone Note Call from Patient Call back at [facility] Phone [phone]   Caller: Patient Summary of Call: PATIENT IS STATING THAT SHE IS HAVING REACTION TO CRESTOR. PT SYMPTONS NO ENERGY,WEAKNESS,LIPS TINGLEY. NAUSEA.PLEASE ADVISE Initial call taken by: Barb Merino,  February 13, 2010 12:55 PM  Follow-up for Phone Call        spoke w/ patient says these symptoms just started since she has been taking medication informed patient to d/c medication and offered appt says she doesn't think she needs to be seen says nausea comes and goes but otherwise doing better but will stay off medication and if she is feeling any better will contact office monday morning.

## 2011-01-26 NOTE — Progress Notes (Signed)
Summary: refill  Phone Note Refill Request Message from:  Fax from Pharmacy on April 08, 2010 4:03 PM  Refills Requested: Medication #1:  GLUCOPHAGE XR 500 MG  TB24 Take one tablet in the morning and 2 tablets  in the evening  Medication #2:  FUROSEMIDE 40 MG TABS once daily [facility] fax [phone]   Method Requested: Fax to Local Pharmacy Initial call taken by: Barb Merino,  April 08, 2010 4:06 PM    Prescriptions: FUROSEMIDE 40 MG TABS (FUROSEMIDE) once daily  #90 x 3   Entered by:   Shonna Chock   Authorized by:   Marga Melnick MD   Signed by:   Shonna Chock on 04/08/2010   Method used:   Faxed to ...       [facility]* (mail-order)             ,          Ph: [phone]       Fax: [phone]   RxID:   [id number] GLUCOPHAGE XR 500 MG  TB24 (METFORMIN HCL) Take one tablet in the morning and 2 tablets  in the evening  #180 x 0   Entered by:   Shonna Chock   Authorized by:   Marga Melnick MD   Signed by:   Shonna Chock on 04/08/2010   Method used:   Faxed to ...       [facility]* (mail-order)             ,          Ph: [phone]       Fax: [phone]   RxID:   [id number]

## 2011-01-26 NOTE — Assessment & Plan Note (Signed)
Summary: per check out/sf   Visit Type:  follow-up Primary Provider:  Marga Melnick, MD   History of Present Illness: [name] returns today for followup.  She is a pleasant 77 yo woman who looks younger than her stated age with a h/o PAF/persistent atrial fibrillation and an RVR who had been maintained in NSR on amiodarone very nicely.  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  She has improved clinically since we last saw her and has maintained NSR.  Her dyspnea has gone from class 2-3 to class 1-2. She has not been coughing.  Current Medications (verified): 1)  Carvedilol 25 Mg Tabs (Carvedilol) .... Take Two Tablets By Mouth Twice A Day 2)  Glucophage Xr 500 Mg  Tb24 (Metformin Hcl) .... Take One Tablet in The Morning and 2 Tablets  in The Evening 3)  Lantus Solostar 100 Unit/ml  Soln (Insulin Glargine) .Marland KitchenMarland Kitchen. 17 Units Daily 4)  Calcium 600mg  With Vit D .... 1 By Mouth Once Daily 5)  Folic Acid 400mg  .... Take 1 Tab Once Daily 6)  Omega Three 1000mg  .... Take 1 Tab Once Daily 7)  Bd Uf Mini Pen Needles 31gx3/16 .... As Directed 8)  Furosemide 40 Mg Tabs (Furosemide) .... Once Daily 9)  Vitamin D3 1000 Unit Tabs (Cholecalciferol) .... Once Daily 10)  Ferrous Sulfate 325 (65 Fe) Mg Tbec (Ferrous Sulfate) .... 2-Am, 2-Pm 11)  Klor-Con M20 20 Meq Cr-Tabs (Potassium Chloride Crys Cr) .... One Tablet Daily 12)  Nitroglycerin 0.4 Mg Subl (Nitroglycerin) .... As Needed For Chest Pain 13)  Crestor 20 Mg Tabs (Rosuvastatin Calcium) .... Take One Tablet By Mouth Daily. 14)  Spiriva Handihaler 18 Mcg Caps (Tiotropium Bromide Monohydrate) .... Inhale Contents of 1 Capsule Each  Am  Allergies: 1)  ! Pcn 2)  ! Diovan 3)  ! Norvasc 4)  ! Tramadol Hcl (Tramadol Hcl)  Past History:  Past Medical History: Last updated: 06/05/2009 HYPOPOTASSEMIA (ICD-276.8) ANEMIA-IRON DEFICIENCY (ICD-280.9) CHEST PAIN (ICD-786.50) FATIGUE  (ICD-780.79) DYSPNEA/SHORTNESS OF BREATH (ICD-786.09) DIAB W/O MENTION COMP TYPE II/UNS TYPE UNCNTRL (ICD-250.02) MUSCLE PAIN (ICD-729.1) DEGENERATIVE JOINT DISEASE (ICD-715.90) ANEMIA, PERNICIOUS (ICD-281.0) UNSPECIFIED ANEMIA (ICD-285.9) ATRIAL FLUTTER (ICD-427.32) C A D (ICD-414.00) HYPERLIPIDEMIA (ICD-272.2) HYPERTENSION, ESSENTIAL NOS (ICD-401.9) Family Hx of BREAST CANCER, FAMILY HX (ICD-V16.3) Family Hx of TIA (ICD-435.9) FAMILY HISTORY DIABETES 1ST DEGREE RELATIVE (ICD-V18.0) DYSTROPHY, ENDOTHELIAL CORNEAL (ICD-371.57) Family Hx of DIABETES MELLITUS (ICD-250.00)  Past Surgical History: Last updated: 01/08/2009 gallbladder surgery gravid 2 para 2 colonoscopy 6 polyps , one pre-malignant 02/2003 knee surgery colonoscopy 2 polyps 05/2006 (Dr. Elvina Mattes in [place]) cataract surgery OD 03/2006; corneal implant 9/08 Bare metal stent 3/08; ablation for a flutter 5/08, Dr Ladona Ridgel   Review of Systems  The patient denies chest pain, syncope, dyspnea on exertion, peripheral edema, and prolonged cough.    Vital Signs:  Patient profile:   77 year old female Height:      63.5 inches Weight:      156 pounds BMI:     27.30 Pulse rate:   59 / minute BP sitting:   148 / 70  (left arm)  Vitals Entered By: Laurance Flatten CMA (July 13, 2010 12:26 PM)  Physical Exam  General:  Appears younger than age,well-nourished,in no acute distress; alert,appropriate and cooperative throughout examination Head:  normocephalic and atraumatic Eyes:  PERRLA/EOM intact; conjunctiva and lids normal. Mouth:  Teeth, gums and palate normal. Oral mucosa normal. Neck:  Neck supple, no JVD. No masses,  thyromegaly or abnormal cervical nodes. Lungs:  Normal respiratory effort, chest expands symmetrically. Lungs are clear to auscultation, no crackles or wheezes. BS decreased Heart:  Normal rate and regular rhythm. S1 and S2 normal without gallop, murmur, click, rub.S4 Abdomen:  Bowel sounds positive,abdomen soft and  non-tender without masses, organomegaly or hernias noted. Msk:  Back normal, normal gait. Muscle strength and tone normal. Pulses:  R and L carotid,radial,dorsalis pedis and posterior tibial pulses are full and equal bilaterally Extremities:  No clubbing, cyanosis, edema. OA finger deformities. Good nail health Neurologic:  alert & oriented X3, strength normal in all extremities, sensation intact to light touch over feet , gait normal, and DTRs symmetrical and normal.     Impression & Recommendations:  Problem # 1:  ATRIAL FIBRILLATION (ICD-427.31) Despite being off of amiodarone, she appears to be maintaining NSR nicely.  I will see her back in several months. Her updated medication list for this problem includes:    Carvedilol 25 Mg Tabs (Carvedilol) .Marland Kitchen... Take two tablets by mouth twice a day  Problem # 2:  CHF (ICD-428.0) She is now class 1-2.  I have recommended she continue her current meds. Her updated medication list for this problem includes:    Carvedilol 25 Mg Tabs (Carvedilol) .Marland Kitchen... Take two tablets by mouth twice a day    Furosemide 40 Mg Tabs (Furosemide) ..... Once daily    Nitroglycerin 0.4 Mg Subl (Nitroglycerin) .Marland Kitchen... As needed for chest pain  Problem # 3:  HYPERTENSION, ESSENTIAL NOS (ICD-401.9) Her blood pressure is well controlled.  She will continue meds as below and maintain a low sodium diet. Her updated medication list for this problem includes:    Carvedilol 25 Mg Tabs (Carvedilol) .Marland Kitchen... Take two tablets by mouth twice a day    Furosemide 40 Mg Tabs (Furosemide) ..... Once daily  Patient Instructions: 1)  Your physician recommends that you schedule a follow-up appointment in: 4 months with DR taylor 2)  Your physician has recommended you make the following change in your medication: restart Pradaxa 150mg  two times a day

## 2011-01-26 NOTE — Progress Notes (Signed)
Summary: wants to start pradaxa  Phone Note Call from Patient Call back at Home Phone [phone]   Caller: Patient Reason for Call: Talk to Nurse Summary of Call: pt would like to start pradaxa . need a 30 day supply [facility] [phone] . Initial call taken by: Lorne Skeens,  June 04, 2010 1:30 PM  Follow-up for Phone Call        Left message to call back Meredith Staggers, RN  June 04, 2010 1:53 PM   per pt she and Dr Ladona Ridgel discussed switching to Pardaxa at her appt 6/8, this is not mentioned in his note, she has checked w/her ins. and it will only cost her about $i70 for a 90 day supply so she would like to change, will send mess to [facility] and make sure ok, she is aware Tresa Endo will call her tom.  Meredith Staggers, RN  June 04, 2010 1:59 PM   Additional Follow-up for Phone Call Additional follow up Details #1::        lmlm for pt to call me regarding Pradaxa.  ok to take per Dr Ladona Ridgel  will need to stop Coumadin for 3 days and then start Pradaxa.  Also asked her to call me regarding her labs.  She will need to stop Amiodarone Dennis Bast, RN, BSN  June 05, 2010 2:25 PM    Additional Follow-up for Phone Call Additional follow up Details #2::    pt calling back, states there should be a coupon for pradaxa here for her [phone], Migdalia Dk  June 08, 2010 2:22 PM  spoke with pt she will come and pick up today Dennis Bast, RN, BSN  June 09, 2010 11:52 AM

## 2011-01-26 NOTE — Progress Notes (Signed)
Summary: rx refill  Phone Note Refill Request Call back at Home Phone [phone] Message from:  Patient on November 12, 2010 2:56 PM  Refills Requested: Medication #1:  CARVEDILOL 25 MG TABS Take two tablets by mouth twice a day please call in a week worth of [facility]s [facility]#[phone]. she has not receive her meds form [facility].    Method Requested: Telephone to Pharmacy Initial call taken by: Roe Coombs,  November 12, 2010 2:57 PM Caller: Patient Reason for Call: Talk to Nurse Summary of Call: pt would to     Prescriptions: CARVEDILOL 25 MG TABS (CARVEDILOL) Take two tablets by mouth twice a day  #28 x 0   Entered by:   Laurance Flatten CMA   Authorized by:   Laren Boom, MD, [facility]   Signed by:   Laurance Flatten CMA on 11/12/2010   Method used:   Electronically to        [facility]. [phone]* (retail)       [phone]       [place], [place]  [address]       Ph: [phone]       Fax: [phone]   RxID:   [id number]

## 2011-01-26 NOTE — Assessment & Plan Note (Signed)
Summary: L HIP AND LEG PAIN/CDJ   Vital Signs:  Patient profile:   77 year old female Weight:      152 pounds BMI:     26.60 Temp:     97.5 degrees F oral Pulse rate:   64 / minute Resp:     17 per minute BP sitting:   128 / 80  (left arm) Cuff size:   large  Vitals Entered By: Shonna Chock CMA (September 23, 2010 2:00 PM) CC: 1.) Left leg and hip pain   2.)Rx for sprivia-3 month supply, Lower Extremity Joint pain   Primary Care Provider:  Marga Melnick, MD  CC:  1.) Left leg and hip pain   2.)Rx for sprivia-3 month supply and Lower Extremity Joint pain.  History of Present Illness: Lower Extremity Joint Pain      This is a 77 year old woman who presents with Lower Extremity Joint pain > 2 months.  The patient reports giving away and decreased ROM, but denies swelling, redness, locking, popping, stiffness for >1 hr, and weakness.  The pain is located in the left hip; it radiates to L foot along lateral leg  & lower back  intermittently.  The pain began gradually and with no injury.  The pain is described as sharp, intermittent, and activity related & with extension . It is better with water aerobics.  Evaluation to date has included no evaluation.  The patient denies the following symptoms: fever, rash, photosensitivity, eye symptoms, diarrhea, and dysuria.  Rx: Tylenol Arthritis & NSAIDS with temporary relief. No PMH of back issues.  Current Medications (verified): 1)  Carvedilol 25 Mg Tabs (Carvedilol) .... Take Two Tablets By Mouth Twice A Day 2)  Glucophage Xr 500 Mg  Tb24 (Metformin Hcl) .... Take One Tablet in The Morning and 2 Tablets  in The Evening 3)  Lantus Solostar 100 Unit/ml  Soln (Insulin Glargine) .Marland KitchenMarland Kitchen. 17 Units Daily 4)  Calcium 600mg  With Vit D .... 1 By Mouth Once Daily 5)  Folic Acid 400mg  .... Take 1 Tab Once Daily 6)  Omega Three 1000mg  .... Take 1 Tab Once Daily 7)  Bd Uf Mini Pen Needles 31gx3/16 .... As Directed 8)  Furosemide 40 Mg Tabs (Furosemide) ....  Once Daily 9)  Vitamin D3 1000 Unit Tabs (Cholecalciferol) .... Once Daily 10)  Ferrous Sulfate 325 (65 Fe) Mg Tbec (Ferrous Sulfate) .... 2-Am, 2-Pm 11)  Klor-Con M20 20 Meq Cr-Tabs (Potassium Chloride Crys Cr) .... One Tablet Daily 12)  Nitroglycerin 0.4 Mg Subl (Nitroglycerin) .... As Needed For Chest Pain 13)  Crestor 20 Mg Tabs (Rosuvastatin Calcium) .... Take One Tablet By Mouth Daily. 14)  Spiriva Handihaler 18 Mcg Caps (Tiotropium Bromide Monohydrate) .... Inhale Contents of 1 Capsule Each  Am 15)  Pradaxa 150 Mg Caps (Dabigatran Etexilate Mesylate) .... One By Mouth Bid 16)  Tylenol Arthritis Pain 650 Mg Cr-Tabs (Acetaminophen) .... As Needed 17)  Ibuprofen 600 Mg Tabs (Ibuprofen) .... As Needed  Allergies: 1)  ! Pcn 2)  ! Diovan 3)  ! Norvasc 4)  ! Tramadol Hcl (Tramadol Hcl)  Review of Systems General:  Denies chills, sweats, and weight loss. GU:  Denies discharge, hematuria, and incontinence. Neuro:  Denies brief paralysis, numbness, and tingling.  Physical Exam  General:  well-nourished,in no acute distress; alert,appropriate and cooperative throughout examination Abdomen:  Bowel sounds positive,abdomen soft and non-tender without masses, organomegaly or hernias noted. Aortic bruit w/o AAA Msk:  No deformity or scoliosis noted  of thoracic or lumbar spine.  Slightly tender to percussion LS area . Neg SLR  Pulses:  R and L dorsalis pedis and posterior tibial pulses are full and equal bilaterally Extremities:  No clubbing, cyanosis, edema. Good nail health. Pain with ROM L hip Neurologic:  alert & oriented X3, strength normal in all extremities, gait abnormal( unable to walk on heels on L), and DTRs symmetrical and normal. Decreased sensation L foot to light touch  Skin:  Intact without suspicious lesions or rashes Cervical Nodes:  No lymphadenopathy noted Axillary Nodes:  No palpable lymphadenopathy Psych:  memory intact for recent and remote, normally interactive, and  good eye contact.     Impression & Recommendations:  Problem # 1:  LUMBAR RADICULOPATHY, RIGHT (ICD-724.4)  ? L 4-5 Her updated medication list for this problem includes:    Tylenol Arthritis Pain 650 Mg Cr-tabs (Acetaminophen) .Marland Kitchen... As needed    Ibuprofen 600 Mg Tabs (Ibuprofen) .Marland Kitchen... As needed    Meloxicam 7.5 Mg Tabs (Meloxicam) .Marland Kitchen... 1 two times a day as needed pain  Orders: T-Lumbar Spine w/Flex & Ext 4 Views (16109UE)  Problem # 2:  HIP PAIN, LEFT (ICD-719.45)  Her updated medication list for this problem includes:    Tylenol Arthritis Pain 650 Mg Cr-tabs (Acetaminophen) .Marland Kitchen... As needed    Ibuprofen 600 Mg Tabs (Ibuprofen) .Marland Kitchen... As needed    Meloxicam 7.5 Mg Tabs (Meloxicam) .Marland Kitchen... 1 two times a day as needed pain  Orders: T-Hip Comp Left Min 2-views (73510TC)  Complete Medication List: 1)  Carvedilol 25 Mg Tabs (Carvedilol) .... Take two tablets by mouth twice a day 2)  Glucophage Xr 500 Mg Tb24 (Metformin hcl) .... Take one tablet in the morning and 2 tablets  in the evening 3)  Lantus Solostar 100 Unit/ml Soln (Insulin glargine) .Marland KitchenMarland KitchenMarland Kitchen 17 units daily 4)  Calcium 600mg  With Vit D  .... 1 by mouth once daily 5)  Folic Acid 400mg   .... Take 1 tab once daily 6)  Omega Three 1000mg   .... Take 1 tab once daily 7)  Bd Uf Mini Pen Needles 31gx3/16  .... As directed 8)  Furosemide 40 Mg Tabs (Furosemide) .... Once daily 9)  Vitamin D3 1000 Unit Tabs (Cholecalciferol) .... Once daily 10)  Ferrous Sulfate 325 (65 Fe) Mg Tbec (Ferrous sulfate) .... 2-am, 2-pm 11)  Klor-con M20 20 Meq Cr-tabs (Potassium chloride crys cr) .... One tablet daily 12)  Nitroglycerin 0.4 Mg Subl (Nitroglycerin) .... As needed for chest pain 13)  Crestor 20 Mg Tabs (Rosuvastatin calcium) .... Take one tablet by mouth daily. 14)  Spiriva Handihaler 18 Mcg Caps (Tiotropium bromide monohydrate) .... Inhale contents of 1 capsule each  am 15)  Pradaxa 150 Mg Caps (Dabigatran etexilate mesylate) .... One by  mouth bid 16)  Tylenol Arthritis Pain 650 Mg Cr-tabs (Acetaminophen) .... As needed 17)  Ibuprofen 600 Mg Tabs (Ibuprofen) .... As needed 18)  Meloxicam 7.5 Mg Tabs (Meloxicam) .Marland Kitchen.. 1 two times a day as needed pain 19)  Gabapentin 100 Mg Caps (Gabapentin) .Marland Kitchen.. 1 every 8 hrs as needed leg pain  Patient Instructions: 1)  Please assess response to pain meds. Prescriptions: GABAPENTIN 100 MG CAPS (GABAPENTIN) 1 every 8 hrs as needed leg pain  #30 x 5   Entered and Authorized by:   Marga Melnick MD   Signed by:   Marga Melnick MD on 09/23/2010   Method used:     RxID:   [id number] MELOXICAM 7.5 MG TABS (MELOXICAM)  1 two times a day as needed pain  #30 x 0   Entered and Authorized by:   Marga Melnick MD   Signed by:   Marga Melnick MD on 09/23/2010   Method used:     RxID:   [id number]

## 2011-01-26 NOTE — Assessment & Plan Note (Signed)
Summary: flu shot/cbs  Nurse Visit   Allergies: 1)  ! Pcn 2)  ! Diovan 3)  ! Norvasc 4)  ! Tramadol Hcl (Tramadol Hcl)  Orders Added: 1)  Flu Vaccine 39yrs + MEDICARE PATIENTS [Q2039] 2)  Administration Flu vaccine - MCR [G0008] Flu Vaccine Consent Questions     Do you have a history of severe allergic reactions to this vaccine? no    Any prior history of allergic reactions to egg and/or gelatin? no    Do you have a sensitivity to the preservative Thimersol? no    Do you have a past history of Guillan-Barre Syndrome? no    Do you currently have an acute febrile illness? no    Have you ever had a severe reaction to latex? no    Vaccine information given and explained to patient? yes    Are you currently pregnant? no    Lot Number:[id number]   Exp Date:06/26/2011   Manufacturer: [employer]    Site Given  Left Deltoid IM.medflu

## 2011-01-26 NOTE — Progress Notes (Signed)
Summary: Re-send RX-pharmacy never recieved  Phone Note Refill Request   Refills Requested: Medication #1:  GLUCOPHAGE XR 500 MG  TB24 Take one tablet in the morning and 2 tablets  in the evening RE-FAX, [facility] NEVER RECIEVED   Method Requested: Fax to Mail Away Pharmacy Initial call taken by: Shonna Chock,  February 13, 2010 4:07 PM    Prescriptions: GLUCOPHAGE XR 500 MG  TB24 (METFORMIN HCL) Take one tablet in the morning and 2 tablets  in the evening  #180 x 0   Entered by:   Shonna Chock   Authorized by:   Marga Melnick MD   Signed by:   Shonna Chock on 02/13/2010   Method used:   Faxed to ...       [facility]* (mail-order)             ,          Ph: [phone]       Fax: [phone]   RxID:   [id number]

## 2011-01-26 NOTE — Medication Information (Signed)
Summary: rov.mp  Anticoagulant Therapy  Managed by: Weston Brass, PharmD Referring MD: Sharrell Ku PCP: Marga Melnick, MD Supervising MD: Antoine Poche MD, Fayrene Fearing Indication 1: Atrial Fibrillation (ICD-427.31) Lab Used: [facility] Site: [facility] INR POC 1.6 INR RANGE 2 - 2.5  Dietary changes: yes       Details: Ate more green vegetables than usual over the holidays.  Health status changes: no    Bleeding/hemorrhagic complications: no    Recent/future hospitalizations: no    Any changes in medication regimen? no    Recent/future dental: no  Any missed doses?: no       Is patient compliant with meds? yes       Allergies: 1)  ! Pcn 2)  ! Diovan 3)  ! Norvasc 4)  ! Tramadol Hcl (Tramadol Hcl)  Anticoagulation Management History:      The patient is taking warfarin and comes in today for a routine follow up visit.  Positive risk factors for bleeding include an age of 75 years or older and presence of serious comorbidities.  Negative risk factors for bleeding include no history of CVA/TIA.  The bleeding index is 'intermediate risk'.  Positive CHADS2 values include History of HTN, Age > 21 years old, and History of Diabetes.  Negative CHADS2 values include Prior Stroke/CVA/TIA.  The start date was 03/15/2007.  Her last INR was 2.2 RATIO.  Anticoagulation responsible provider: Antoine Poche MD, Fayrene Fearing.  INR POC: 1.6.  Cuvette Lot#: [id number].  Exp: 01/2011.    Anticoagulation Management Assessment/Plan:      The patient's current anticoagulation dose is Warfarin sodium 2 mg tabs: Use as directed by Anticoagualtion Clinic.  The target INR is 2.0-3.0.  The next INR is due 01/12/2010.  Anticoagulation instructions were given to patient.  Results were reviewed/authorized by Weston Brass, PharmD.  She was notified by Lew Dawes, PharmD Candidate.         Prior Anticoagulation Instructions: INR 2.3  Continue 0.5 tab on Sunday, Tuesday, Thursday. Continue 1 tab on all other days.    Recheck in  1 month.   Current Anticoagulation Instructions: INR 1.6  Take 1.5 tablets today and tomorrow then resume normal dose of 1 tablet daily except 1/2 tablet Sundays, Tuesdays, and Thursdays. Recheck 1/17.

## 2011-01-26 NOTE — Medication Information (Signed)
Summary: rov/tm  Anticoagulant Therapy  Managed by: Leota Sauers, PharmD, BCPS, CPP Referring MD: Sharrell Ku PCP: Marga Melnick, MD Supervising MD: Daleen Squibb MD, Maisie Fus Indication 1: Atrial Fibrillation (ICD-427.31) Lab Used: [facility] Site: [facility] INR POC 1.5 INR RANGE 2.0-3.0  Dietary changes: yes       Details: more greens  Health status changes: no    Bleeding/hemorrhagic complications: no    Recent/future hospitalizations: no    Any changes in medication regimen? no    Recent/future dental: no  Any missed doses?: no       Is patient compliant with meds? yes       Current Medications (verified): 1)  Carvedilol 25 Mg Tabs (Carvedilol) .... Take Two Tablets By Mouth Twice A Day 2)  Glucophage Xr 500 Mg  Tb24 (Metformin Hcl) .... Take One Tablet in The Morning and 2 Tablets  in The Evening 3)  Lantus Solostar 100 Unit/ml  Soln (Insulin Glargine) .Marland KitchenMarland Kitchen. 17 Units Daily 4)  Calcium 600mg  With Vit D .... 1 By Mouth Once Daily 5)  Folic Acid 400mg  .... Take 1 Tab Once Daily 6)  Omega Three 1000mg  .... Take 1 Tab Once Daily 7)  Bd Uf Mini Pen Needles 31gx3/16 .... As Directed 8)  Furosemide 40 Mg Tabs (Furosemide) .... Once Daily 9)  Vitamin D3 1000 Unit Tabs (Cholecalciferol) .Marland Kitchen.. 1 Am, 1 Pm 10)  Ferrous Sulfate 325 (65 Fe) Mg Tbec (Ferrous Sulfate) .... 2-Am, 2-Pm 11)  Klor-Con M20 20 Meq Cr-Tabs (Potassium Chloride Crys Cr) .... One Tablet Daily 12)  Amiodarone Hcl 200 Mg Tabs (Amiodarone Hcl) .Marland Kitchen.. 1 By Mouth Once Daily 13)  Warfarin Sodium 2 Mg Tabs (Warfarin Sodium) .... Use As Directed By Anticoagualtion Clinic 14)  Nitroglycerin 0.4 Mg Subl (Nitroglycerin) .... As Needed For Chest Pain 15)  Cyanocobalamin 1000 Mcg/ml Soln (Cyanocobalamin) .Marland Kitchen.. 1 Injection Monthly 16)  Crestor 20 Mg Tabs (Rosuvastatin Calcium) .Marland Kitchen.. 1 Once Daily  ( Check Pt/inr  I Week After This Is Started)  Allergies: 1)  ! Pcn 2)  ! Diovan 3)  ! Norvasc 4)  ! Tramadol Hcl (Tramadol  Hcl)  Anticoagulation Management History:      The patient is taking warfarin and comes in today for a routine follow up visit.  Positive risk factors for bleeding include an age of 12 years or older and presence of serious comorbidities.  Negative risk factors for bleeding include no history of CVA/TIA.  The bleeding index is 'intermediate risk'.  Positive CHADS2 values include History of CHF, History of HTN, Age > 61 years old, and History of Diabetes.  Negative CHADS2 values include Prior Stroke/CVA/TIA.  The start date was 03/15/2007.  Her last INR was 2.2 RATIO.  Anticoagulation responsible provider: Daleen Squibb MD, Maisie Fus.  INR POC: 1.5.  Cuvette Lot#: [id number].  Exp: 04/2011.    Anticoagulation Management Assessment/Plan:      The patient's current anticoagulation dose is Warfarin sodium 2 mg tabs: Use as directed by Anticoagualtion Clinic.  The target INR is 2.0-3.0.  The next INR is due 05/19/2010.  Anticoagulation instructions were given to patient.  Results were reviewed/authorized by Leota Sauers, PharmD, BCPS, CPP.         Prior Anticoagulation Instructions: INR 2.9 Continue 2mg s daily except 1mg s on Tuesdays, Thursdays and Sundays. Recheck in 4 weeks.   Current Anticoagulation Instructions: INR 1.5  Take extra 1 whole tablet today Tue 5/3 then Coumadin 1 tab = 2mg  on Mon, Wed, Fri, Sat 1/2 tab =  1mg  on Sun, Tue, Thur

## 2011-01-26 NOTE — Miscellaneous (Signed)
Summary: Orders Update pft charges  Clinical Lists Changes  Orders: Added new Service order of Carbon Monoxide diffusing w/capacity (94720) - Signed Added new Service order of Lung Volumes (94240) - Signed Added new Service order of Spirometry (Pre & Post) (94060) - Signed 

## 2011-01-26 NOTE — Miscellaneous (Signed)
Summary: Appointment Canceled  Appointment status changed to canceled by [employer] on 11/03/2010 8:39 AM.  Cancellation Comments --------------------- echo/427.32/mcr/uch/no prec. req./saf  Appointment Information ----------------------- Appt Type:  CARDIOLOGY ANCILLARY VISIT      Date:  Tuesday, November 03, 2010      Time:  10:30 AM for 60 min   Urgency:  Routine   Made By:  Hoy Finlay Scheduler  To Visit:  [id number]    Reason:  echo/427.32/mcr/uch/no prec. req./saf  Appt Comments ------------- -- 11/03/10 8:39: ([facility]) CANCELED -- echo/427.32/mcr/uch/no prec. req./saf -- 10/21/10 10:52: ([facility]) BOOKED -- Routine CARDIOLOGY ANCILLARY VISIT at 11/03/2010 10:30 AM for 60 min echo/427.32/mcr/uch/no prec. req./saf -- 10/21/10 10:50: ([facility]) BOOKED

## 2011-01-26 NOTE — Letter (Signed)
Summary: Handout Printed  Printed Handout:  - Coumadin Instructions-w/out Meds 

## 2011-01-26 NOTE — Assessment & Plan Note (Signed)
Summary: HOSPITAL FU/KDC   Vital Signs:  Patient profile:   77 year old female Weight:      157.8 pounds O2 Sat:      94 % Pulse rate:   54 / minute Resp:     16 per minute BP sitting:   114 / 78  (left arm) Cuff size:   large  Vitals Entered By: Shonna Chock (February 17, 2010 12:28 PM) CC: Hospital Follow-up, Discuss Lasix,  Refill Pen Needles Comments REVIEWED MED LIST, PATIENT AGREED DOSE AND INSTRUCTION CORRECT    Primary Care Provider:  Marga Melnick, MD  CC:  Hospital Follow-up, Discuss Lasix, and Refill Pen Needles.  History of Present Illness: Acute onset of DOE 02/13/2010 after arising; actually mild DOE began 02/17 & that evening she had PNDyspnea. She went to ER 02/20;records reviewed : H/H 11.4/34.1;CXray interstitial edema;BNP 454(<100); glucose 127. Lasix 80 mg given IV. No increased sodium intake pre symptoms.  Allergies: 1)  ! Pcn 2)  ! Diovan 3)  ! Norvasc 4)  ! Tramadol Hcl (Tramadol Hcl)  Review of Systems CV:  Complains of shortness of breath with exertion; denies chest pain or discomfort, difficulty breathing at night, difficulty breathing while lying down, swelling of feet, and swelling of hands; DOE improved 50%. Resp:  Denies chest pain with inspiration and coughing up blood.  Physical Exam  General:  Appears younger than age,in no acute distress; alert,appropriate and cooperative throughout examination Lungs:  Normal respiratory effort, chest expands symmetrically. Lungs are clear to auscultation, no crackles or wheezes. Heart:  regular rhythm and bradycardia.   Split S1. No NVD, HJR Abdomen:  Bowel sounds positive,abdomen soft and non-tender without masses, organomegaly or hernias noted. Extremities:  No clubbing, cyanosis, edema. Homan's negative Neurologic:  alert & oriented X3.   Psych:  normally interactive and good eye contact.     Impression & Recommendations:  Problem # 1:  CHF (ICD-428.0)  Her updated medication list for this  problem includes:    Carvedilol 25 Mg Tabs (Carvedilol) .Marland Kitchen... Take two tablets by mouth twice a day    Furosemide 40 Mg Tabs (Furosemide) .Marland Kitchen... 1/2 once daily    Warfarin Sodium 2 Mg Tabs (Warfarin sodium) ..... Use as directed by anticoagualtion clinic  Orders: Venipuncture (72536) T-BNP  (B Natriuretic Peptide) (64403-47425)  Complete Medication List: 1)  Carvedilol 25 Mg Tabs (Carvedilol) .... Take two tablets by mouth twice a day 2)  Glucophage Xr 500 Mg Tb24 (Metformin hcl) .... Take one tablet in the morning and 2 tablets  in the evening 3)  Lantus Solostar 100 Unit/ml Soln (Insulin glargine) .Marland KitchenMarland KitchenMarland Kitchen 17 units daily 4)  Calcium 600mg  With Vit D  .... 1 by mouth once daily 5)  Folic Acid 400mg   .... Take 1 tab once daily 6)  Omega Three 1000mg   .... Take 1 tab once daily 7)  Bd Uf Mini Pen Needles 31gx3/16  .... As directed 8)  Furosemide 40 Mg Tabs (Furosemide) .... 1/2 once daily 9)  Vitamin D3 1000 Unit Tabs (Cholecalciferol) .Marland Kitchen.. 1 am, 1 pm 10)  Ferrous Sulfate 325 (65 Fe) Mg Tbec (Ferrous sulfate) .... 2-am, 2-pm 11)  Klor-con M20 20 Meq Cr-tabs (Potassium chloride crys cr) .... One tablet daily 12)  Amiodarone Hcl 200 Mg Tabs (Amiodarone hcl) .Marland Kitchen.. 1 by mouth once daily 13)  Warfarin Sodium 2 Mg Tabs (Warfarin sodium) .... Use as directed by anticoagualtion clinic 14)  Nitroglycerin 0.4 Mg Subl (Nitroglycerin) .... As needed for chest pain  15)  Cyanocobalamin 1000 Mcg/ml Soln (Cyanocobalamin) .Marland Kitchen.. 1 injection monthly 16)  Crestor 20 Mg Tabs (Rosuvastatin calcium) .Marland Kitchen.. 1 once daily  ( check pt/inr  i week after this is started)  Patient Instructions: 1)  Limit your Sodium (Salt) to less than 4 grams a day (slightly less than 1 teaspoon) to prevent fluid retention, swelling, or worsening or symptoms. Prescriptions: BD UF MINI PEN NEEDLES 31GX3/16 AS DIRECTED  #100 x 3   Entered by:   Shonna Chock   Authorized by:   Marga Melnick MD   Signed by:   Shonna Chock on 02/17/2010    Method used:   Print then Give to Patient   RxID:   [id number]   Appended Document: HOSPITAL FU/KDC

## 2011-01-26 NOTE — Assessment & Plan Note (Signed)
Summary: b-12 in/cbs  Nurse Visit   Allergies: 1)  ! Pcn 2)  ! Diovan 3)  ! Norvasc 4)  ! Tramadol Hcl (Tramadol Hcl)  Medication Administration  Injection # 1:    Medication: Vit B12 1000 mcg    Diagnosis: ANEMIA-IRON DEFICIENCY (ICD-280.9)    Route: IM    Site: L deltoid    Exp Date: 03/2012    Lot #: [id number]    Mfr: [employer]    Patient tolerated injection without complications    Given by: Shonna Chock (May 18, 2010 11:04 AM)  Orders Added: 1)  Vit B12 1000 mcg [J3420] 2)  Admin of Therapeutic Inj  intramuscular or subcutaneous [45409]

## 2011-01-26 NOTE — Progress Notes (Signed)
Summary: REFILL  Phone Note Refill Request Message from:  Fax from Pharmacy on [facility] FAX [phone]  Refills Requested: Medication #1:  LANTUS SOLOSTAR 100 UNIT/ML  SOLN 17 UNITS DAILY Initial call taken by: Barb Merino,  December 29, 2009 11:51 AM    Prescriptions: LANTUS SOLOSTAR 100 UNIT/ML  SOLN (INSULIN GLARGINE) 17 UNITS DAILY  #65mosupply x 2   Entered by:   Shonna Chock   Authorized by:   Marga Melnick MD   Signed by:   Shonna Chock on 12/29/2009   Method used:   Faxed to ...       [facility] (mail-order)             ,          Ph: [phone]       Fax: [phone]   RxID:   [id number]

## 2011-01-26 NOTE — Medication Information (Signed)
Summary: rov/eh  Anticoagulant Therapy  Managed by: Cloyde Reams, RN, BSN Referring MD: Sharrell Ku PCP: Marga Melnick, MD Supervising MD: Jens Som MD, Arlys John Indication 1: Atrial Fibrillation (ICD-427.31) Lab Used: [facility] Site: [facility] INR POC 2.3 INR RANGE 2 - 2.5  Dietary changes: no    Health status changes: no    Bleeding/hemorrhagic complications: no    Recent/future hospitalizations: no    Any changes in medication regimen? no    Recent/future dental: no  Any missed doses?: no       Is patient compliant with meds? yes       Allergies: 1)  ! Pcn 2)  ! Diovan 3)  ! Norvasc 4)  ! Tramadol Hcl (Tramadol Hcl)  Anticoagulation Management History:      The patient is taking warfarin and comes in today for a routine follow up visit.  Positive risk factors for bleeding include an age of 75 years or older and presence of serious comorbidities.  Negative risk factors for bleeding include no history of CVA/TIA.  The bleeding index is 'intermediate risk'.  Positive CHADS2 values include History of HTN, Age > 75 years old, and History of Diabetes.  Negative CHADS2 values include Prior Stroke/CVA/TIA.  The start date was 03/15/2007.  Her last INR was 2.2 RATIO.  Anticoagulation responsible provider: Jens Som MD, Arlys John.  INR POC: 2.3.  Cuvette Lot#: [id number].  Exp: 03/2011.    Anticoagulation Management Assessment/Plan:      The patient's current anticoagulation dose is Warfarin sodium 2 mg tabs: Use as directed by Anticoagualtion Clinic.  The target INR is 2.0-3.0.  The next INR is due 02/02/2010.  Anticoagulation instructions were given to patient.  Results were reviewed/authorized by Cloyde Reams, RN, BSN.  She was notified by Cloyde Reams RN.         Prior Anticoagulation Instructions: INR 1.6  Take 1.5 tablets today and tomorrow then resume normal dose of 1 tablet daily except 1/2 tablet Sundays, Tuesdays, and Thursdays. Recheck 1/17.  Current Anticoagulation  Instructions: INR 2.3  Continue on same dosage 1 tablet daily except 1/2 tablet on Sundays, Tuesdays, and Thursdays.   Recheck in 3 weeks.

## 2011-01-28 NOTE — Progress Notes (Signed)
Summary: Med refill/Pharm change  Phone Note Refill Request Message from:  Patient on January 15, 2011 10:47 AM  Refills Requested: Medication #1:  LANTUS SOLOSTAR 100 UNIT/ML  SOLN 17 UNITS DAILY  Medication #2:  CRESTOR 20 MG TABS Take one tablet by mouth daily. Patient called noting that she now uses [facility].  Initial call taken by: Lucious Groves CMA,  January 15, 2011 10:47 AM Initial call taken by: Lucious Groves CMA,  January 15, 2011 10:46 AM    Prescriptions: CRESTOR 20 MG TABS (ROSUVASTATIN CALCIUM) Take one tablet by mouth daily.  #90 Tablet x 2   Entered by:   Lucious Groves CMA   Authorized by:   Marga Melnick MD   Signed by:   Lucious Groves CMA on 01/15/2011   Method used:   Faxed to ...       [facility] (mail[facility]       [address] [place], [place]  [address]       Ph: [phone]       Fax: [phone]   RxID:   [id number] LANTUS SOLOSTAR 100 UNIT/ML  SOLN (INSULIN GLARGINE) 17 UNITS DAILY  #26mosupply x 2   Entered by:   Lucious Groves CMA   Authorized by:   Marga Melnick MD   Signed by:   Lucious Groves CMA on 01/15/2011   Method used:   Faxed to ...       [facility] (mail[facility]order)       [address] [place], [place]  [address]       Ph: [phone]       Fax: [phone]   RxID:   [id number]

## 2011-02-15 ENCOUNTER — Telehealth (INDEPENDENT_AMBULATORY_CARE_PROVIDER_SITE_OTHER): Payer: Self-pay | Admitting: *Deleted

## 2011-02-16 ENCOUNTER — Ambulatory Visit (INDEPENDENT_AMBULATORY_CARE_PROVIDER_SITE_OTHER): Payer: Medicare Other | Admitting: Internal Medicine

## 2011-02-16 ENCOUNTER — Encounter: Payer: Self-pay | Admitting: Internal Medicine

## 2011-02-16 DIAGNOSIS — I4891 Unspecified atrial fibrillation: Secondary | ICD-10-CM

## 2011-02-16 DIAGNOSIS — I5032 Chronic diastolic (congestive) heart failure: Secondary | ICD-10-CM

## 2011-02-23 NOTE — Assessment & Plan Note (Signed)
Summary: per check out/sf   Primary Provider:  Marga Melnick, MD   History of Present Illness: [name] returns today for followup.  She is a pleasant 77 yo woman who looks younger than her stated age with a h/o PAF/persistent atrial fibrillation and an RVR who had been maintained in NSR on amiodarone very nicely.  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  She has improved clinically since we last saw her and has maintained NSR.  Her dyspnea has gone from class 2-3 to class 1-2. She has not been coughing. She denies recurrent palpitations.  She notes that she has been exercising regularly.  No peripheral edema.  Current Medications (verified): 1)  Carvedilol 25 Mg Tabs (Carvedilol) .... Take Two Tablets By Mouth Twice A Day 2)  Glucophage Xr 500 Mg  Tb24 (Metformin Hcl) .... Take One Tablet in The Morning and 2 Tablets  in The Evening **appointment Due** 3)  Lantus Solostar 100 Unit/ml  Soln (Insulin Glargine) .Marland Kitchen.. 15 Units Daily 4)  Calcium 600mg  With Vit D .... 1 By Mouth Once Daily 5)  Folic Acid 400mg  .... Take 1 Tab Once Daily 6)  Omega Three 1000mg  .... Take 1 Tab Once Daily 7)  Bd Uf Mini Pen Needles 31gx3/16 .... As Directed 8)  Furosemide 40 Mg Tabs (Furosemide) .... Once Daily 9)  Vitamin D3 1000 Unit Tabs (Cholecalciferol) .... Once Daily 10)  Ferrous Sulfate 325 (65 Fe) Mg Tbec (Ferrous Sulfate) .... Once Daily 11)  Klor-Con M20 20 Meq Cr-Tabs (Potassium Chloride Crys Cr) .... One Tablet Daily 12)  Nitroglycerin 0.4 Mg Subl (Nitroglycerin) .... As Needed For Chest Pain 13)  Crestor 20 Mg Tabs (Rosuvastatin Calcium) .... Take One Tablet By Mouth Daily. 14)  Spiriva Handihaler 18 Mcg Caps (Tiotropium Bromide Monohydrate) .... Inhale Contents of 1 Capsule Each  Am 15)  Pradaxa 150 Mg Caps (Dabigatran Etexilate Mesylate) .... One By Mouth Bid 16)  Ibuprofen 600 Mg Tabs (Ibuprofen) .... As Needed  Allergies: 1)  ! Pcn 2)   ! Diovan 3)  ! Norvasc 4)  ! Tramadol Hcl (Tramadol Hcl)  Past History:  Past Medical History: Last updated: 06/05/2009 HYPOPOTASSEMIA (ICD-276.8) ANEMIA-IRON DEFICIENCY (ICD-280.9) CHEST PAIN (ICD-786.50) FATIGUE (ICD-780.79) DYSPNEA/SHORTNESS OF BREATH (ICD-786.09) DIAB W/O MENTION COMP TYPE II/UNS TYPE UNCNTRL (ICD-250.02) MUSCLE PAIN (ICD-729.1) DEGENERATIVE JOINT DISEASE (ICD-715.90) ANEMIA, PERNICIOUS (ICD-281.0) UNSPECIFIED ANEMIA (ICD-285.9) ATRIAL FLUTTER (ICD-427.32) C A D (ICD-414.00) HYPERLIPIDEMIA (ICD-272.2) HYPERTENSION, ESSENTIAL NOS (ICD-401.9) Family Hx of BREAST CANCER, FAMILY HX (ICD-V16.3) Family Hx of TIA (ICD-435.9) FAMILY HISTORY DIABETES 1ST DEGREE RELATIVE (ICD-V18.0) DYSTROPHY, ENDOTHELIAL CORNEAL (ICD-371.57) Family Hx of DIABETES MELLITUS (ICD-250.00)  Past Surgical History: Last updated: 01/08/2009 gallbladder surgery gravid 2 para 2 colonoscopy 6 polyps , one pre-malignant 02/2003 knee surgery colonoscopy 2 polyps 05/2006 (Dr. Elvina Mattes in [place]) cataract surgery OD 03/2006; corneal implant 9/08 Bare metal stent 3/08; ablation for a flutter 5/08, Dr Ladona Ridgel   Review of Systems  The patient denies chest pain, syncope, dyspnea on exertion, and peripheral edema.    Vital Signs:  Patient profile:   77 year old female Height:      63 inches Weight:      162 pounds BMI:     28.80 Pulse rate:   63 / minute BP sitting:   156 / 62  (left arm)  Vitals Entered By: Laurance Flatten CMA (February 16, 2011 9:01 AM)  Physical Exam  General:  well-nourished,in no acute distress; alert,appropriate and  cooperative throughout examination Head:  normocephalic and atraumatic Eyes:  PERRLA/EOM intact; conjunctiva and lids normal. Mouth:  Teeth, gums and palate normal. Oral mucosa normal. Neck:  Neck supple, no JVD. No masses, thyromegaly or abnormal cervical nodes. Chest Wall:  no deformities or breast masses noted Lungs:  Clear bilaterally to auscultation  with no wheezes, rales, or rhonchi. Heart:  RRR with normal S1 and S2. PMI is not enlarged or laterally displaced.  Abdomen:  Bowel sounds positive,abdomen soft and non-tender without masses, organomegaly or hernias noted. Aortic bruit w/o AAA Msk:  No deformity or scoliosis noted of thoracic or lumbar spine.  Slightly tender to percussion LS area . Neg SLR  Pulses:  R and L dorsalis pedis and posterior tibial pulses are full and equal bilaterally Extremities:  No clubbing, cyanosis, edema. Good nail health. Pain with ROM L hip Neurologic:  Alert and oriented x 3.   EKG  Procedure date:  02/16/2011  Findings:      Normal sinus rhythm with rate of:63.   Left ventricular hypertrophy.    Impression & Recommendations:  Problem # 1:  ATRIAL FIBRILLATION (ICD-427.31) She continues to maintain NSR. She will continue her current meds. If she goes back into atrial fib, then I will consider tikosyn. Her updated medication list for this problem includes:    Carvedilol 25 Mg Tabs (Carvedilol) .Marland Kitchen... Take two tablets by mouth twice a day  Problem # 2:  HYPERTENSION, ESSENTIAL NOS (ICD-401.9) Her blood pressure is a bit elevated. I have encouraged her to maintain a low sodium diet. She will continue her current meds.  Her updated medication list for this problem includes:    Carvedilol 25 Mg Tabs (Carvedilol) .Marland Kitchen... Take two tablets by mouth twice a day    Furosemide 40 Mg Tabs (Furosemide) ..... Once daily

## 2011-02-23 NOTE — Progress Notes (Signed)
Summary: refill  Phone Note Refill Request Message from:  Fax from Pharmacy on February 15, 2011 4:49 PM  Refills Requested: Medication #1:  GLUCOPHAGE XR 500 MG  TB24 Take one tablet in the morning and 2 tablets  in the evening [facility] [phone]  Initial call taken by: Okey Regal Spring,  February 15, 2011 4:49 PM  Follow-up for Phone Call        Copied from June 2011:   Please recheck A1c in 4 months. (250.00).Hopp   **Labs were due 09/2010**  Follow-up by: Shonna Chock CMA,  February 15, 2011 4:57 PM    New/Updated Medications: GLUCOPHAGE XR 500 MG  TB24 (METFORMIN HCL) Take one tablet in the morning and 2 tablets  in the evening **APPOINTMENT DUE** Prescriptions: GLUCOPHAGE XR 500 MG  TB24 (METFORMIN HCL) Take one tablet in the morning and 2 tablets  in the evening **APPOINTMENT DUE**  #93 x 0   Entered by:   Shonna Chock CMA   Authorized by:   Marga Melnick MD   Signed by:   Shonna Chock CMA on 02/15/2011   Method used:   [facility]ronically [facility]        [phone]       [address]       [place], [place]  [address]       Ph: [phone] or [phone]       Fax: [phone]   RxID:   [id number]

## 2011-03-17 LAB — CBC
HCT: 34.1 % — ABNORMAL LOW (ref 36.0–46.0)
Hemoglobin: 11.4 g/dL — ABNORMAL LOW (ref 12.0–15.0)
MCHC: 33.6 g/dL (ref 30.0–36.0)
MCV: 89.8 fL (ref 78.0–100.0)
RBC: 3.8 MIL/uL — ABNORMAL LOW (ref 3.87–5.11)
RDW: 14.2 % (ref 11.5–15.5)

## 2011-03-17 LAB — POCT I-STAT, CHEM 8
Calcium, Ion: 1.2 mmol/L (ref 1.12–1.32)
Creatinine, Ser: 0.8 mg/dL (ref 0.4–1.2)
Glucose, Bld: 127 mg/dL — ABNORMAL HIGH (ref 70–99)
HCT: 38 % (ref 36.0–46.0)
Hemoglobin: 12.9 g/dL (ref 12.0–15.0)
Potassium: 4.3 mEq/L (ref 3.5–5.1)
TCO2: 27 mmol/L (ref 0–100)

## 2011-03-17 LAB — BRAIN NATRIURETIC PEPTIDE: Pro B Natriuretic peptide (BNP): 454 pg/mL — ABNORMAL HIGH (ref 0.0–100.0)

## 2011-03-20 ENCOUNTER — Other Ambulatory Visit: Payer: Self-pay | Admitting: Internal Medicine

## 2011-03-22 ENCOUNTER — Other Ambulatory Visit: Payer: Self-pay | Admitting: *Deleted

## 2011-03-22 ENCOUNTER — Telehealth: Payer: Self-pay | Admitting: Internal Medicine

## 2011-03-22 MED ORDER — METFORMIN HCL ER 500 MG PO TB24: 500.0000 mg | ORAL_TABLET | Freq: Every day | ORAL | Status: DC

## 2011-03-22 MED ORDER — DABIGATRAN ETEXILATE MESYLATE 150 MG PO CAPS: 150.0000 mg | ORAL_CAPSULE | Freq: Two times a day (BID) | ORAL | Status: DC

## 2011-03-22 NOTE — Telephone Encounter (Signed)
Please recheck A1c in 4 months. (250.00).Hopp Copied from 05/2010 lab append, labs were due 09/2010

## 2011-03-22 NOTE — Telephone Encounter (Signed)
Pt needs a refill for pradaxa called into [facility]

## 2011-03-23 ENCOUNTER — Other Ambulatory Visit: Payer: Self-pay | Admitting: *Deleted

## 2011-03-23 MED ORDER — FREESTYLE LITE TEST VI STRP: ORAL_STRIP | Status: DC

## 2011-04-12 LAB — CBC
Hemoglobin: 8.8 g/dL — ABNORMAL LOW (ref 12.0–15.0)
MCHC: 31.1 g/dL (ref 30.0–36.0)
Platelets: 290 10*3/uL (ref 150–400)
RDW: 15.8 % — ABNORMAL HIGH (ref 11.5–15.5)

## 2011-04-12 LAB — GLUCOSE, CAPILLARY
Glucose-Capillary: 141 mg/dL — ABNORMAL HIGH (ref 70–99)
Glucose-Capillary: 172 mg/dL — ABNORMAL HIGH (ref 70–99)
Glucose-Capillary: 172 mg/dL — ABNORMAL HIGH (ref 70–99)
Glucose-Capillary: 222 mg/dL — ABNORMAL HIGH (ref 70–99)

## 2011-04-12 LAB — PROTIME-INR: Prothrombin Time: 22.7 seconds — ABNORMAL HIGH (ref 11.6–15.2)

## 2011-04-20 ENCOUNTER — Telehealth: Payer: Self-pay | Admitting: Internal Medicine

## 2011-04-20 NOTE — Telephone Encounter (Signed)
lmom for pt samples out front  And ok to hold Pradaxa if eye doctor deems necessary

## 2011-04-20 NOTE — Telephone Encounter (Signed)
Pt requesting samples of pradaxa- and surgery on eyes 5-10 does pt need to go off pradaxa and for how long

## 2011-04-20 NOTE — Telephone Encounter (Signed)
Pt having a Corneal transplant 05/06/11 Does she need to be off Coumadin and if so for how long?  If they want to stop per Dr Ladona Ridgel ok to stop Will check for samples

## 2011-04-20 NOTE — Telephone Encounter (Signed)
Pt needs refill of lasix for [facility]

## 2011-04-21 MED ORDER — FUROSEMIDE 40 MG PO TABS: 40.0000 mg | ORAL_TABLET | Freq: Every day | ORAL | Status: DC

## 2011-04-22 ENCOUNTER — Other Ambulatory Visit: Payer: Self-pay

## 2011-04-22 MED ORDER — METFORMIN HCL ER 500 MG PO TB24: 500.0000 mg | ORAL_TABLET | Freq: Every day | ORAL | Status: DC

## 2011-04-22 NOTE — Telephone Encounter (Signed)
Please recheck A1c in 4 months. (250.00).Hopp. Copied from 06/20/2010 lab append, labs were due in 09/2010

## 2011-04-30 ENCOUNTER — Telehealth: Payer: Self-pay | Admitting: Internal Medicine

## 2011-04-30 NOTE — Telephone Encounter (Signed)
Faxed OV, EKG & Echo to [facility] ([phone]).

## 2011-05-03 ENCOUNTER — Other Ambulatory Visit: Payer: Self-pay | Admitting: *Deleted

## 2011-05-03 MED ORDER — INSULIN GLARGINE 100 UNIT/ML ~~LOC~~ SOLN: 15.0000 [IU] | Freq: Every day | SUBCUTANEOUS | Status: DC

## 2011-05-11 NOTE — Assessment & Plan Note (Signed)
 HEALTHCARE                         ELECTROPHYSIOLOGY OFFICE NOTE   LACHELLE, RISSLER                    MRN:          [id number]  DATE:02/13/2008                            DOB:          1931/02/26    Ms. Pinkard returns today for followup.  She is a very pleasant elderly  woman with a history of atrial flutter and hypertension, who is status  post electrophysiological study and catheter ablation.  She returns  today for followup.  When I saw her back in August, her blood pressure  remained elevated, and her medications were adjusted.  She returns today  for followup.  She had no specific complaints today.  In the interim,  she has undergone eye surgery which was successful.   PHYSICAL EXAMINATION:  GENERAL:  On exam, she is a pleasant 77 year old  woman who looks younger than her stated age.  VITAL SIGNS:  Blood pressure was 142/62, the pulse 78 and regular.  Respirations were 18.  The weight was 167 pounds.  NECK:  Revealed no jugular distention.  LUNGS:  Clear bilaterally to auscultation.  No wheezes, rales or rhonchi  are present.  CARDIOVASCULAR:  Regular rate and rhythm, normal S1 and S2.  EXTREMITIES:  Demonstrate no edema.  ABDOMEN:  Soft, nontender.   MEDICATIONS:  Her medications include:  1. Metformin 1 gram twice daily.  2. Calcium and vitamin supplements.  3. Aspirin 325 a day.  4. Folate 400 a day.  5. Atenolol 100 mg in the morning and 50 in the evening.  6. Vytorin 10/20.  7. Lantus insulin.  8. Actos 30 a day.  9. HCTZ 25 a day.  10.She says she is also on next Exforge 5/160.   Her EKG today demonstrates sinus bradycardia, first-degree block and  LVH.   IMPRESSION:  1. Symptomatic atrial flutter.  2. Hypertension.  3. Status post with electrophysiology study and catheter ablation.   DISCUSSION:  Overall, Ms. Fredman is stable, and she is maintaining  sinus rhythm very nicely.  I will see her back in the office on  a p.r.n.  basis.  She will follow up with Dr. Alwyn Ren.     Doylene Canning. Ladona Ridgel, MD  Electronically Signed    GWT/MedQ  DD: 02/13/2008  DT: 02/14/2008  Job #: [id number]

## 2011-05-11 NOTE — Discharge Summary (Signed)
[name], [name]             ACCOUNT NO.:  [id number]   MEDICAL RECORD NO.:  [id number]          PATIENT TYPE:  OIB   LOCATION:  2899                         FACILITY:  [facility]   PHYSICIAN:  Doylene Canning. Ladona Ridgel, MD    DATE OF BIRTH:  03/23/1931   DATE OF ADMISSION:  01/31/2009  DATE OF DISCHARGE:  01/31/2009                               DISCHARGE SUMMARY   PRIMARY CARE PHYSICIAN:  Titus Dubin. Alwyn Ren, MD, FACP, FCCP   TIME SPENT:  Greater than 35 minutes.   FINAL DIAGNOSES:  1. Atrial fibrillation, controlled ventricular rate status post direct      current cardioversion at 200 joules of biphasic energy, January 31, 2009, atrial fibrillation/flutter to sinus rhythm.  2. Starting amiodarone 200 mg daily this admission.  3. Followup with Dr. Ladona Ridgel in 3-4 weeks.  His office will call that      appointment.   PROCEDURE:  Direct current cardioversion, atrial fibrillation/flutter to  sinus rhythm, delivery of 200 joules of biphasic energy, Dr. Lewayne Bunting.   BRIEF HISTORY:  Ms. Patteson is a 77 year old female.  She has a history  of coronary artery disease, hypertension, dyslipidemia, and atrial  flutter.  Her atrial flutter can precipitate congestive heart failure  symptoms.   The patient had electrophysiology study with catheter ablation of atrial  flutter in July 2008.  At that time, identified was not only atrial  flutter but atrial tachycardia, both successfully ablated.  The patient  presents today at the office, January 29, 2009.  She feels overall well,  but electrocardiogram shows atrial fibrillation with controlled  ventricular response of 69 beats per minute.  The patient has been  scheduled for direct current cardioversion since her INR has been  therapeutic over the past 4 weeks.   PAST MEDICAL HISTORY:  1. Coronary artery disease.  2. Hypertension.  3. Diabetes.  4. Dyslipidemia.  5. Congestive heart failure, particularly precipitated when the      patient  is in fast a atrial arrhythmia.   MEDICATIONS:  The patient is discharging after cardioversion on the  following medications:  1. Coumadin as directed by the [facility].  2. Lasix 40 mg daily.  3. Vytorin 10/20 daily at bedtime.  4. Atenolol 100 mg in the morning and 50 mg in the evening.  5. Metformin 500 mg tablets 2 tablets in the morning and 1/2 tablet of      500 in the evening.  6. Digoxin 0.125 mg daily.  7. Lantus 8 units subcutaneous injection daily.  8. Folic acid 400 mg daily.  9. Vitamin D 1000 mg 2 tablets daily.  10.Calcium 600 mg daily.   She also takes fish oil caps and flaxseed which she can continue.  New  medication this admission is amiodarone 200 mg daily.   FOLLOWUP:  She will follow up with Dr. Ladona Ridgel in 3 weeks.  They will  call her at her home phone which is [phone] or at work from 7:30 to  12:30, [phone].   LABORATORY STUDIES THIS ADMISSION:  Complete blood drawn on January 29, 2009:  White cells 4.7, hemoglobin 11.1, hematocrit 35.5, and platelets  are 298.  Protime is 22.3 and INR is 2.2.  Sodium 141 and potassium 3.4.  She was started on potassium at this time and this will also be added to  her medication listing, now she takes potassium 20 mEq daily.  Chloride  is 102, carbonate 29, glucose 162, BUN is 15, and creatinine 0.8.  Once  again, potassium chloride 20 mEq daily, started on January 30, 2009.      Maple Mirza, PA      Doylene Canning. Ladona Ridgel, MD  Electronically Signed    GM/MEDQ  D:  01/31/2009  T:  02/01/2009  Job:  [id number]   cc:   Titus Dubin. Alwyn Ren, MD,FACP,FCCP

## 2011-05-11 NOTE — Discharge Summary (Signed)
[name], [name]             ACCOUNT NO.:  [id number]   MEDICAL RECORD NO.:  [id number]          PATIENT TYPE:  INP   LOCATION:  3703                         FACILITY:  [facility]   PHYSICIAN:  Sean A. Everardo All, MD    DATE OF BIRTH:  January 26, 1931   DATE OF ADMISSION:  12/24/2008  DATE OF DISCHARGE:  12/27/2008                               DISCHARGE SUMMARY   REASON FOR ADMISSION:  Atrial fibrillation.   HISTORY OF PRESENT ILLNESS:  This is a 77 year old woman admitted by Dr.  Alwyn Ren on December 24, 2008, with shortness of breath.  Please refer to  his history and physical for details.   HOSPITAL COURSE:  The patient was admitted and found to have a new-onset  atrial fibrillation.  She was seen in consultation by Dr. Ladona Ridgel who  added digoxin onto her beta-blocker.   She was also found to have diastolic dysfunction and hydrochlorothiazide  was changed to Lasix.   Insulin was continued for her diabetes during her hospitalization.   [name] with a low iron level and iron as  prescribed for her.  No cause of bleeding is obvious, although Dr.  Alwyn Ren has already requested outpatient GI evaluation for this.   By December 27, 2008, the patient was alert, oriented, feeling much  better, ambulatory, eating a usual diet, and was thus discharged home in  fair condition.   DIAGNOSES AT THE TIME OF DISCHARGE:  1. New-onset atrial fibrillation.  2. Diastolic congestive heart failure.  3. Iron deficiency anemia.  4. Also see Dr. Frederik Pear history and physical.   MEDICATIONS AT THE TIME OF DISCHARGE:  1. Digoxin 0.125 mg a day.  2. Lasix 40 mg a day.  3. Coumadin 4 mg daily.  4. Nonprescription iron, 2 pills twice a day.  5. Vytorin, uncertain dosage, 1 daily.  6. Atenolol 100 mg q.a.m., 50 mg q.p.m.  7. Metformin 500 mg twice a day.  8. Lantus 16 units daily.   The following drugs are discontinued:  Hydrochlorothiazide, aspirin,  Actos, and Exforge.   No  specific restriction on diet and activity except she is advised to  take it easy until improvement in her anemia is determined.   FOLLOWUP:  Dr. Ladona Ridgel in 3-4 weeks, [facility] in 3-4 days, and Dr.  Alwyn Ren within a few weeks.  Focused followup recheck of her glucose and  anemia is advised by Dr. Alwyn Ren, as medication changes in these are  made.      Sean A. Everardo All, MD  Electronically Signed     SAE/MEDQ  D:  12/27/2008  T:  12/27/2008  Job:  [id number]

## 2011-05-11 NOTE — H&P (Signed)
[name], [name]             ACCOUNT NO.:  [id number]   MEDICAL RECORD NO.:  [id number]          [name] TYPE:  INP   LOCATION:  3703                         FACILITY:  [facility]   PHYSICIAN:  Madolyn Frieze. Jens Som, MD, FACCDATE OF BIRTH:  September 11, 1931   DATE OF ADMISSION:  12/24/2008  DATE OF DISCHARGE:                              HISTORY & PHYSICAL   PRIMARY CARDIOLOGIST:  Doylene Canning. Ladona Ridgel, MD   PRIMARY CARE PHYSICIAN:  Titus Dubin. Alwyn Ren, MD, FACP, [facility]   We were asked to consult [name], 77 year old [place] American  female with complaints of dyspnea, chest heaviness with a diagnosis of  atrial fibrillation by EKG.  [name] has a known history of atrial  flutter, status post ablation in July 2008, status post cardiac  catheterization/PCI with a bare-metal stent to [name] first obtuse marginal  in March 2008 in [name] setting of an non-STEMI.  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  However, over  [name] last 2 months, she was more dyspneic.  She has had a 10-pound weight  gain.  She denies any palpitations, has experienced some chest heaviness  associated with shortness of breath.  She states she is restless at  night, unable to get comfortable, has developed a dry cough and noticed  that she wheezes at times.  Most recent echocardiogram done in July 2008  showed EF of 60% with moderate mitral valve regurgitation.   PAST MEDICAL HISTORY:  1. Atrial flutter, status post ablation July 2008.  2. Coronary artery disease/non-ST elevated MI, status post PCI with a      bare-metal stent to [name] first obtuse marginal on March 2008.  3. Pernicious anemia.  [name] states being followed by Dr.      Alwyn Ren.  4. Diabetes.  5. Dyslipidemia.  6.  Hypertension.  7. Atrial tachycardia.  There is a questionable history of atrial fib      and CHF, but I cannot find any documentation in Cardiology note.   SOCIAL HISTORY:  [name] lives in [place] alone.  She is a widow.  She continues to teach third grade, quit using tobacco in 1997.  Denies  any drug or medication use.  Denies any alcohol use.  Denies any recent  over-[name]-counter Sudafed-type products; previously very active, walked 3  days a week, but over [name] last 2 months, she has not been able to do  this.   FAMILY HISTORY:  Mother deceased at age 59 secondary to CVA.  Father  deceased in his 60s secondary to MI.  Sibling positive with diabetes.   REVIEW OF SYSTEMS:  Positive for chills, weight gain as described above,  shortness of breath, chest heaviness, dyspnea on exertion, orthopnea,  cough which is nonproductive and occasional wheezing, increased  restlessness, inability to sleep, generalized weakness, cold  intolerance.  All other  systems reviewed and negative.   ALLERGIES:  Conflicting data regarding allergies, [name] is unable  to clarify.  She has listed ACTOS, IODINE, PENICILLIN, DIOVAN, NORVASC,  TRAMADOL, and CRESTOR.  However, [name] is currently on Exforge,  which is a combination of Diovan and Norvasc, and she is not aware of  any allergy to Actos as she has been taking Actos up until today.   MEDICATIONS:  1. Atenolol 100 mg in a.m. and 50 in [name] evening.  2. Prilosec.  3. OTC Exforge 5/160.  4. Vytorin 10/20.  5. Hydrochlorothiazide 25.  6. Aspirin 325.   PHYSICAL EXAMINATION:  VITAL SIGNS:  Temperature 97.8, pulse 105 and  irregular, respirations 20, blood pressure 132/67, and sat 94% on 2  liters.  GENERAL:  In no acute distress.  HEENT:  Unremarkable.  NECK:  Supple without lymphadenopathy, no bruits.  She does have JVD  around 10 cm.  CARDIOVASCULAR:  S1 and S2, irregular rhythm.  I do not hear any murmur,  rubs, or gallops.   LUNGS:  Clear to auscultation bilaterally.  SKIN:  Warm and dry.  ABDOMEN:  Soft, nontender, positive bowel sounds.  LOWER EXTREMITIES:  Without clubbing, cyanosis.  She has a trace of  pitting edema bilaterally.  NEUROLOGIC:  Alert and oriented x3.   Chest x-ray is pending.  EKG, atrial fib at a rate of 91.  H and H of  9.1 and 29, WBC 6.4, platelets 304,000.  Sodium 142, potassium 4,  chloride 106, BUN 21, creatinine 0.8, and glucose 128.  Point of cares  are pending.   1. Atrial fibrillation, questionable new onset, CHADS score 4.  2. Acute congestive heart failure in [name] setting of atrial      fibrillation.  3. History of atrial flutter.  4. History of coronary artery disease, previous percutaneous coronary      intervention.  5. Anemia, being followed by attending physician.   Plan is to cycle cardiac enzymes, anticoagulate.  [name] will  benefit from anticoagulation therapy and long-term Coumadin.  Check a  TSH, D-dimer, hemoccult stools, and scheduled for outpatient stress  Myoview.  We will consider a TEE with cardioversion when [name]  remains symptomatic.  We will also make changes in medication as  follows:  Discontinue hydrochlorothiazide, give 40 of Lasix IV now and  then 40 p.o. daily,  discontinue Diovan, and continue atenolol at current dose, discontinue  aspirin, discontinue Exforge and we will have pharmacy assist in dosing  Coumadin and heparin.  We will also repeat 2-D echocardiogram for  reevaluation of mitral regurgitation.  Dr. Jens Som has been into  examine and assessed [name] and agrees with plan of care.      Dorian Pod, ACNP      Madolyn Frieze. Jens Som, MD, [facility]  Electronically Signed    MB/MEDQ  D:  12/24/2008  T:  12/25/2008  Job:  [id number]

## 2011-05-11 NOTE — Discharge Summary (Signed)
NAMECAISLEY, BAXENDALE             ACCOUNT NO.:  [id number]   MEDICAL RECORD NO.:  [id number]          PATIENT TYPE:  OIB   LOCATION:  4711                         FACILITY:  [facility]   PHYSICIAN:  Doylene Canning. Ladona Ridgel, MD    DATE OF BIRTH:  07-22-1931   DATE OF ADMISSION:  07/05/2007  DATE OF DISCHARGE:  07/06/2007                               DISCHARGE SUMMARY   She has allergy to PENICILLIN.   Dictation greater than 35 minutes.   FINAL DIAGNOSES:  1. Atrial flutter/atrial tachycardia.  2. Discharging day one status post electrophysiology study,      radiofrequency catheter ablation of both atrial flutter and atrial      tachycardia.   SECONDARY DIAGNOSES:  1. Non-ST-elevation myocardial infarction in March 2008 with a finding      at catheterization of 99% first obtuse marginal lesion, status post      bare metal stent.  2. New-onset atrial flutter noted at left heart catheterization, March      2008.  3. Diabetes.  4. Hypertension.  5. Dyslipidemia.   PROCEDURES:  1. July 05, 2007, transesophageal echocardiogram for atrial flutter.      The study showed normal left ventricular function, moderate left      ventricular hypertrophy.  No left atrial appendage thrombus.      Moderate right atrial enlargement.  Trileaflet aortic valve with      trace aortic insufficiency, Dr. Olga Millers.  2. July 05, 2007, electrophysiology study and radiofrequency catheter      ablation of atrial flutter with cavotricuspid isthmus ablation with      also atrial tachycardia ablation by Dr. Lewayne Bunting.  No inducible      arrhythmias after ablation.  The patient maintaining sinus rhythm      in the 16-hour postprocedure period here at [facility].   BRIEF HISTORY:  Ms. Patchell is a 77 year old female referred to Dr.  Lewayne Bunting by Dr. Randa Evens for evaluation of atrial flutter.  The patient had a feeling of fluttering in her chest in March.  She went  to her primary caregiver, who did an  electrocardiogram which was  abnormal and the patient was admitted directly to [facility].  At that time she underwent left heart catheterization, which showed a  high-grade lesion in the first obtuse marginal and treated with a bare  metal stent.  The patient also was discovered to have new-onset atrial  flutter.  Dr. Ladona Ridgel has evaluated the patient, recommending EP study  and ablation.  The patient has been started on anticoagulation and when  this will be therapeutic and consistent, the patient will present for  procedure at [facility].   HOSPITAL COURSE:  The patient presents on July 9 for electrophysiology  study.  Her INR on July 8 was 1.5.  The patient underwent TEE prior to  the procedure showed no a left atrial appendage thrombus.  The procedure  then went forward with Dr. Ladona Ridgel as described above.  Two arrhythmias  were highlighted, an atrial flutter with cavotricuspid isthmus block and  an atrial tachycardia, which  was ablated.  The patient has done well  after the procedure without chest pain, without fluttering.  There is a  note from Dr. Samule Ohm at office visit May 2008 which said that he was  unable to engage the right coronary artery, which had an aberrant origin  just above the left main coronary artery.  He suggested an exercise  Myoview study after resolution of her atrial arrhythmias.  This will be  scheduled now as an outpatient.   The patient discharging on the following medication:  1. Metformin 1000 mg twice daily.  She is to restart this on Saturday,      July 12.  2. Hydrochlorothiazide 25 mg one-half tablet daily.  3. Enteric-coated aspirin 325 mg daily.  4. Plavix 75 mg daily.  5. Atenolol 100 mg in the morning, 50 mg in the evening.  6. Vytorin 10/20 mg daily at bedtime.  7. Activella 1 mg morning, 0.5 mg in the evening.  8. Lantus 10 units daily.  9. Coumadin 2.5 mg daily except for 5 mg on Tuesdays.   Follow-up is at [facility], [address]:  1. Coumadin Clinic Tuesday, July 15, at 10:45.  2. Stress test Tuesday, July 22, at 11:45.  The patient is advised to      eat nothing after midnight Monday, July 21.  3. To see Dr. Ladona Ridgel Wednesday, August 13, at 10:15.   Laboratory studies this admission were pro times.  On July 9 her pro  time was 32, INR 2.9.  on July 10 her pro time was 36, INR 3.3.   Greater than 35 minutes      Maple Mirza, [place]      Doylene Canning. Ladona Ridgel, MD  Electronically Signed    GM/MEDQ  D:  07/06/2007  T:  07/07/2007  Job:  [id number]   cc:   Titus Dubin. Alwyn Ren, MD,FACP,FCCP

## 2011-05-11 NOTE — Op Note (Signed)
[name], [name]             ACCOUNT NO.:  [id number]   MEDICAL RECORD NO.:  [id number]          PATIENT TYPE:  OIB   LOCATION:  2899                         FACILITY:  [facility]   PHYSICIAN:  Doylene Canning. Ladona Ridgel, MD    DATE OF BIRTH:  March 26, 1931   DATE OF PROCEDURE:  01/31/2009  DATE OF DISCHARGE:  01/31/2009                               OPERATIVE REPORT   PROCEDURE PERFORMED:  Direct current cardioversion.   INDICATIONS:  Symptomatic atrial fibrillation and flutter.   INTRODUCTION:  The patient is a 77 year old woman with a history of  atrial flutter status post ablation.  She subsequently developed atrial  fibrillation and is now referred for DC cardioversion.   PROCEDURE IN DETAIL:  After the usual preparation, the patient was  sedated with 100 mg of sodium pentothal.  The electrode dispersive pads  which had previously been placed in the anterior-posterior position  subsequently were delivered 200 joules of synchronized biphasic energy  which restored sinus rhythm.  The patient subsequently had junctional  rhythm as well.  She was stable and had no problems with her  cardioversion.   COMPLICATIONS:  There were no immediate procedure complications.   RESULTS:  Demonstrates successful DC cardioversion terminating  AFib/flutter and restoring sinus rhythm/junctional rhythm without  immediate procedure complications.      Doylene Canning. Ladona Ridgel, MD  Electronically Signed     GWT/MEDQ  D:  01/31/2009  T:  01/31/2009  Job:  [id number]

## 2011-05-11 NOTE — Assessment & Plan Note (Signed)
[facility]                         ELECTROPHYSIOLOGY OFFICE NOTE   NAME:Liebler, TANEAL SONNTAG                    MRN:          [id number]  DATE:08/09/2007                            DOB:          [birth year]    HISTORY OF PRESENT ILLNESS:  Ms. Strathman returns today for follow up.  She is a very pleasant middle aged woman with a history of coronary  disease, hypertension, dyslipidemia and atrial flutter with congestive  heart failure secondary to this.  She underwent electrophysiology study  and catheter ablation of her atrial flutter back in July and at that  time we found not only typical flutter but atrial tachycardia, both of  which were successfully ablated.  She returns today for follow up.  Overall, she feels well. She notes that she had two brief episodes of  palpitations several days ago, but otherwise, stable.   MEDICATIONS:  1. Metformin.  2. HCTZ.  3. Plavix recently discontinued.  4. Atenolol 100 in the morning and 50 in the evening.  5. Vytorin 10/20.  6. Insulin.   PHYSICAL EXAMINATION:  GENERAL:  She is a pleasant, well-appearing woman  in no distress.  VITAL SIGNS:  Blood pressure 170/80, pulse 67 and regular, respirations  18.  NECK:  No JVD.  LUNGS:  Clear bilaterally to auscultation.  No wheezes, rales or rhonchi  were present.  CARDIOVASCULAR:  Regular rate and rhythm, normal S1, S2.  There is a  soft S4 gallop.  EXTREMITIES:  No cyanosis, clubbing or edema.  Pulses were 2+ and  symmetric.   STUDIES:  EKG demonstrates sinus rhythm with left axis deviation and  LVH.   IMPRESSION:  1. Atrial flutter.  2. Status post ablation.  3. Hypertension .  4. Coronary disease.   DISCUSSION:  Overall, Ms. Allington is stable.  Her blood pressure is  still elevated.  I have recommended that she see Dr. Alwyn Ren who is her  primary physician back in follow up and ongoing treatment of her blood  pressure.  I plan to see her back in six  months.     Doylene Canning. Ladona Ridgel, MD  Electronically Signed    GWT/MedQ  DD: 08/09/2007  DT: 08/10/2007  Job #: [id number]   cc:   Titus Dubin. Alwyn Ren, MD,FACP,FCCP

## 2011-05-11 NOTE — Assessment & Plan Note (Signed)
[facility]                         ELECTROPHYSIOLOGY OFFICE NOTE   NAME:Vaughan, GEONNA LOCKYER                    MRN:          [id number]  DATE:02/28/2009                            DOB:          28-Aug-1931     Ms. Kosa returns today for followup.  She is a very pleasant 77-year-  old woman with a history of atrial flutter status post ablation and  history of atrial fibrillation which has developed after ablation for  which she underwent DC cardioversion with restoration of sinus rhythm.  The patient has a history of congestive heart failure predominantly  diastolic but also some systolic dysfunction.  She has a history of  coronary disease status post MI.  She is here today for followup after  cardioversion.  She has done well.  Her dyspnea is much improved her  peripheral edema is improved.  She denies syncope or other significant  palpitations.   MEDICATIONS:  1. Coumadin as directed.  2. Lasix 40 a day  3. Vytorin 10/20 a day.  4. Atenolol 100 in the morning, 50 in the evening.  5. Metformin 500 two tablets in the morning, half tablet in the      evening.  6. Digoxin 0.125 daily.  7. Lantus insulin.  8. Folic acid 400 mcg a day.  9. Amiodarone 200 a day.   PHYSICAL EXAMINATION:  General:  She is a pleasant well-appearing 30-  year-old woman in no distress.  VITAL SIGNS:  Blood pressure was 150/70, the pulse 54 and regular,  respirations were 18, weight was 162 pounds.  NECK:  No jugular distention.  LUNGS:  Clear bilaterally to auscultation.  No wheezes, rales, or  rhonchi present.  CARDIAC:  Regular bradycardia with normal S1 and a split S2.  ABDOMINAL:  Soft, nontender.  EXTREMITIES:  Demonstrated no edema.   EKG demonstrates sinus bradycardia with first-degree AV block and  incomplete right bundle-branch block.   IMPRESSION:  1. Atrial fibrillation status post DC cardioversion.  2. Acute on chronic diastolic heart failure now  well-controlled with      also systolic component.  3. Status post DC cardioversion with maintenance of sinus rhythm on      amiodarone therapy with sinus bradycardia.  4. History of ischemic heart disease status post myocardial      infarction.   DISCUSSION:  Ms. Dinino is stable today.  She has enquired about  cardiac rehab and I have given a referral for this.  With regard to her  amiodarone, I have recommended we continue on  200 a day until I see her back in the office in 3-4 months.  At that  time, will consider decreasing her dose some.  I have encouraged her to  maintain a low-salt diet and hopefully by doing so she can continue to  decrease her Lasix dosing.     Doylene Canning. Ladona Ridgel, MD  Electronically Signed    GWT/MedQ  DD: 02/28/2009  DT: 03/01/2009  Job #: [id number]

## 2011-05-11 NOTE — Op Note (Signed)
[name], [name]             ACCOUNT NO.:  [id number]   MEDICAL RECORD NO.:  [id number]          PATIENT TYPE:  OIB   LOCATION:  4711                         FACILITY:  [facility]   PHYSICIAN:  Doylene Canning. Ladona Ridgel, MD    DATE OF BIRTH:  01-09-1931   DATE OF PROCEDURE:  07/05/2007  DATE OF DISCHARGE:                               OPERATIVE REPORT   PROCEDURE PERFORMED:  Left physiologic study and radio frequency  catheter ablation of atrial flutter as well as atrial tachycardia.   INTRODUCTION:  The patient is 77 year old woman with a history of  palpitations and documented SVT that found to be atrial flutter with  rapid ventricular response.  Because of INRs that leaving variable she  underwent TEE today which demonstrated no left atrial appendage  thrombus.  She is now referred for electrophysiologic study and catheter  ablation.   PROCEDURE:  After informed consent obtained, the patient taken  diagnostic EP lab in fasting state.  After usual preparation, draping,  intravenous fentanyl and Midazolam was given for sedation.  A 6-French  hexapolar catheter was inserted percutaneously in the right jugular vein  and advanced coronary sinus.  A 7-French 20 pole halo catheter was  inserted percutaneously in the right femoral vein and advanced to the  right atrium.  A 5-French quadripolar catheter was inserted  percutaneously in a right femoral vein advanced to His bundle region.  After measurement of the basic intervals, mapping was carried out.  This  demonstrated what appeared to be typical counterclockwise tricuspid  annular reentrant atrial flutter.  The earliest activation left atrium  was in the proximal coronary sinus with the distal being latest.  The  activation in the right atrium was counterclockwise around tricuspid  valve annulus.  Entrainment mapping was carried out which appeared to  demonstrate classic entrainment mapping criteria when pacing was carried  out approximately  6 o'clock in the tricuspid valve annulus region.  Pacing in other sites particularly in the high right atrium demonstrated  no evidence of activation in this region being part of the circuit.  With all the above, the 7-French quadripolar ablation catheter was  maneuvered into the right atrium and advanced into the tricuspid valve  annulus.  A total of 14 RF energy applications were delivered to the  atrial flutter isthmus between the septal portion of the tricuspid valve  annulus.  All way out to an area at approximately 7 o'clock on the  tricuspid valve annulus and back to the inferior vena cava.  Despite  this, the atrial flutter did not slow down and the tachycardia did not  terminate.  At this point the patient was sedated more deeply and  underwent DC cardioversion restoring sinus rhythm.  Pacing was then  carried out demonstrating block in the atrial flutter isthmus.  These  findings were suggestive that in fact, the flutter despite evidence of  entrainment mapping criteria being met was not in the tricuspid valve  annulus but in fact was traversing across the atrial septum high to low  going in a counterclockwise direction around the tricuspid valve annulus  and also simultaneously being activating the septum and proximal portion  of the coronary sinus and lower left atrium with the activation  extending out laterally.  Interestingly enough, programmed atrial  stimulation was then carried out from the coronary sinus and the high  right atrium, resulting in the induction of fairly incessant atrial  tachycardia.  The cycle length of atrial tachycardia was 344  milliseconds.  It could terminate with catheter manipulation was always  be easily be induced.  Mapping of the tachycardia was then carried out.  At a location just superior to the os of the coronary sinus near the AV  node region in the mid septum.  The activation front was approximately  26 milliseconds earlier than the His A  and also much earlier than the CS  a.  A single RF energy application was delivered to this region  resulting in immediate termination of the patient's atrial tachycardia.  Following this, additional pacing was carried out utilizing a aggressive  pacing protocol with attempts to reinduce the patient's atrial flutters  and attempts to induce the patient's atrial tachycardia.  Despite pacing  down to a cycle length of 200 milliseconds from the CS in the right  atrium as well as performing programmed atrial stimulation down to  atrial refractoriness, there was no inducible arrhythmias.  At this  point because there were no arrhythmias that could be induced, the  procedure was terminated, the catheters were removed and hemostasis was  assured.   COMPLICATIONS:  There were no immediate procedure complications.   RESULTS:  a.  Baseline ECG.  The baseline ECG demonstrates atrial  flutter with variable AV conduction.  b.  Baseline intervals.  The HV interval was 38 milliseconds.  The QRS  duration was 110 milliseconds.  c.  Rapid ventricular pacing.  Rapid ventricular pacing following  catheter ablation demonstrated VA dissociation.  d.  Programmed ventricular stimulation.  Programmed ventricular  stimulation following catheter ablation demonstrated VA dissociation at  base drive cycle length of 161 milliseconds.  e.  Programmed atrial stimulation.  Probing atrial stimulation was  carried out from the coronary sinus and high right atrium following  ablation cardioversion with the S1-S2 interval stepwise decreased down  to 30 milliseconds where atrial ERP was observed.  During probing  stimulation there is no AH jumps, there was inducible atrial  tachycardia.  f.  Rapid atrial pacing.  Rapid atrial pacing was carried out from the  coronary sinus and stepwise decreased down 260 milliseconds where atrial  refractoriness was observed.  Pacing at pacing cycle length of 500  milliseconds  demonstrated AV Wenckebach.  g.  Arrhythmias observed.  1. Atrial flutter.  2. Atrial tachycardia.  The cycle length of the packet of the flutter      was 220 milliseconds, cycle length of tachycardia 244 milliseconds.      With a single RF energy application delivered to the AV nodal      region, the tachycardias were not inducible.      a.  Mapping.  Mapping of the atrial tachycardia demonstrated evidence for  entrainment mapping criteria being met.  Retrospective there may have  been a lack of capture resulting in a spurious result though this could  not be confirmed.  1. RF energy application.  A total of 14 RF energy applications were      delivered to the atrial flutter isthmus and a single RF energy      application was delivered to the  area in the midportion of the      atrial septum just above the coronary sinus os.  Following ablation      there was no inducible arrhythmias.   CONCLUSION:  This study demonstrates inducible atrial tachycardia which  was successfully ablated.  It demonstrates successful ablation of the  atrial flutter isthmus.  It demonstrates atrial flutter which appears to  have been non isthmus dependent but after ablation could not be  reinduced.  The clinical significance at this finding is uncertain at  the present time.      Doylene Canning. Ladona Ridgel, MD  Electronically Signed     GWT/MEDQ  D:  07/05/2007  T:  07/06/2007  Job:  [id number]

## 2011-05-11 NOTE — Assessment & Plan Note (Signed)
[facility]                            CARDIOLOGY OFFICE NOTE   [name], [name]                    MRN:          [id number]  DATE:05/01/2007                            DOB:          1931/01/30    PRIMARY CARE PHYSICIAN:  [name].   HISTORY OF PRESENT ILLNESS:  [name] is a 77 year old lady who  presented with non-ST elevation myocardial infarction and new onset  atrial flutter in mid March 2008.  I placed a bare metal stent in the  large first marginal.  From a coronary disease standpoint, she has done  well since hospitalization.  There has been no recurrence of the chest  discomfort which prompted her hospitalization.   She does have dyspnea with relatively minor exertion.  She is not having  any dyspnea at rest and has had no orthopnea, PND, edema, presyncope or  syncope.  She has had persistent atrial flutter.  She is seeing Dr.  Ladona Ridgel who offered her ablation.  She wished to think about this.   Her current medications are:  1. Metformin 1000 mg twice daily.  2. Hydrochlorothiazide 12.5 mg daily.  3. Calcium.  4. Vitamin D.  5. Aspirin 325 mg daily.  6. Plavix 75 mg daily.  7. Folate 400 mg daily.  8. Atenolol 100 mg in the morning and 50 mg in the evening.  9. Coumadin is followed by our Coumadin Clinic.  10.Vytorin 10/20 one daily.  11.Activella 1 mg daily.  12.Lantus insulin 10 units daily.  13.Omega 3 fish oil 1000 mg daily.   PHYSICAL EXAMINATION:  She is generally well-appearing in no distress  with heart rate 72, blood pressure 200/100 and weight of 167 pounds.  She has no jugular venous distention, thyromegaly, or lymphadenopathy.  LUNGS:  Clear to auscultation.  Respiratory effort is normal.  She has a nondisplaced point of maximal cardiac impulse. She has a  regular rate and rhythm without murmur, rub, or gallop.  ABDOMEN:  Soft, nondistended, nontender.  There is no  hepatosplenomegaly.  Bowel sounds are  normal.  EXTREMITIES:  Warm without clubbing, cyanosis, edema, or ulceration.  Carotid pulses 2+ bilaterally without bruit.   IMPRESSION/RECOMMENDATION:  1. Atrial flutter:  Discussed management options with her.  She now      prefers ablation.  We will notify Dr. Ladona Ridgel.  2. Coronary disease:  Doing nicely after stenting in the marginal. I      could not selectively engage the right coronary artery which had an      aberrant origin from just above her left main coronary artery.  Non-      selective angiogram suggested at least a moderate stenosis above      the acute margin.  After all other problems are resolved, we will      plan exercise test to assess this.  However, unless her shortness      of breath persists after establishment of sinus rhythm, I would not      proceed with intervention.  3. Hypertension:  Substantial.  Continue current medicines and  increase hydrochlorothiazide to 25 mg daily.     Salvadore Farber, MD  Electronically Signed    WED/MedQ  DD: 05/01/2007  DT: 05/02/2007  Job #: [id number]

## 2011-05-12 ENCOUNTER — Other Ambulatory Visit: Payer: Self-pay

## 2011-05-12 MED ORDER — INSULIN GLARGINE 100 UNIT/ML ~~LOC~~ SOLN: 15.0000 [IU] | Freq: Every day | SUBCUTANEOUS | Status: DC

## 2011-05-14 NOTE — H&P (Signed)
[name], [name]             ACCOUNT NO.:  [id number]   MEDICAL RECORD NO.:  [id number]          PATIENT TYPE:  INP   LOCATION:  3739                         FACILITY:  [facility]   PHYSICIAN:  Titus Dubin. Hopper, MD,FACP,FCCPDATE OF BIRTH:  11[birth year]07/33   DATE OF ADMISSION:  03/13/2007  DATE OF DISCHARGE:                              HISTORY & PHYSICAL   HISTORY OF PRESENT ILLNESS:  [name] is a 77 year old female who  came to the office for a 53-month followup of her diabetes.  She  described a 3-week history of exertional dyspnea, which has limited her  activities.  Additionally, on March 16th, she had chest tightness, which  lasted 5 minutes.  It manifested as a dull discomfort in the scapular  area.  She described it as the most scary incident I have ever had.  It was associated with shortness of breath to the point that she could  not talk.  It was not associated with nausea or diaphoresis and it  occurred at rest.  She denies any recent reflux symptoms or dyspepsia.  The patient has had no melena or rectal bleeding.   An incidental finding, here in the office, was atrial fibrillation and  significant hypertension, blood pressure 164/80.   PAST MEDICAL HISTORY:  Includes:  1. Cholecystectomy.  2. Two pregnancies, 2 deliveries.  3. Six polyps found at colonoscopy in 2004.  One of those      premalignant; followup in June of 2007 revealed 2 polyps which were      not malignant.  4. In 2004 she had knee surgery.  5. She had cataract surgery on the right in 2007.   MEDICAL PROBLEMS:  Include:  1. Diabetes.  2. Hypertension.  3. Dyslipidemia.  4. Elevated homocystine level.  5. She has Fuch's adenoma dystrophy and is followed by the [facility].   Maternal aunt had breast cancer.  Mother had diabetes, hypertension,  TIAs and arthritis.  Father had myocardial infarction and arthritis.  Paternal uncle had open heart surgery.   She quit smoking in  1997; she drinks rarely.   ALLERGIES:  1. PENICILLIN.  2. AMLODIPINE.  3. ACTOS.  4. DIOVAN.  5. SIGNIFICANTLY DIOVAN CAUSED ANGIOEDEMA, AND AMLODIPINE CAUSED      SWELLING OF THE GUMS.   MEDICATIONS:  She is presently on:  1. Atenolol 100 mg daily.  2. Vytorin 10/20 q.h.s.  3. Gingko biloba extract.  4. Baby aspirin.  5. Calcium and vitamin D.  6. Activella.  7. Hydrochlorothiazide 25 mg 1/2 daily.  8. Metformin ER 500 mg twice a day.  9. Lantus insulin 10 units daily.   REVIEW OF SYSTEMS:  She denies any constitutional symptoms.  She will  occasionally have some discharge from the nares, but has no facial pain,  purulence or fever to suggest rhinosinusitis.  Again, she has had no  active GI or genitourinary symptoms.  She has had no new paresthesias or  neurologic signs or symptoms.  She does feel stress related to volunteer  obligations at church and school.  She denies  polyuria, polydipsia or  polyphagia.  Fasting sugars have been 99-128.  She has not been checking  2-hour postprandial sugars.   She denies the intake of any stimulants; she did take Tylenol PM 2 weeks  ago and felt she had somewhat of an adverse reaction.  She has been  walking 2-3 times per week, approximately 1 mile, but as stated has had  exertional dyspnea for the last 2 weeks.   PHYSICAL EXAMINATION:  GENERAL:  She appears much younger than her  stated age.  VITAL SIGNS:  Weight is 165.  Pulse is 76 and regular.  Respiratory rate  16.  Blood pressure 164/80.  HEENT:  She has some mild arteriolar changes on fundal exam.  Her pupils  are widely dilated.  She has ptosis of the right eye.  She has an upper  partial.  Hygiene is excellent.  Nares are patent.  Tympanic membranes  are normal.  NECK:  She has no  lymphadenopathy about the head, neck or axillae.  Thyroid is small and firm.  CHEST:  Clear to auscultation.  CARDIAC:  She has an irregular with a subtle  flow murmur.  ABDOMEN:  She has no  organomegaly or masses and the abdomen is  nontender.  EXTREMITIES:  Homans sign is negative.  Pedal pulses are decreased.  She  has no edema.  NEURO:  There are no localizing neuropsychiatric findings.   EKG revealed atrial fibrillation with a rate of 87.  There were  nonspecific ST-T wave changes inferolaterally; ischemia must be ruled  out.   PLAN:  She will be admitted to [facility] telemetry with cardiac enzymes.  Cardiology consult will be pursued.  Lovenox coverage will be initiated.  Full thyroid function tests will be performed.  Her metformin will be  held and she will be kept on the Lantus with sliding scale coverage.      Titus Dubin. Alwyn Ren, MD,FACP,FCCP  Electronically Signed     WFH/MEDQ  D:  03/13/2007  T:  03/13/2007  Job:  [id number]

## 2011-05-14 NOTE — Discharge Summary (Signed)
NAMECEIRRA, BELLI             ACCOUNT NO.:  [id number]   MEDICAL RECORD NO.:  [id number]          PATIENT TYPE:  INP   LOCATION:  6523                         FACILITY:  [facility]   PHYSICIAN:  Salvadore Farber, MD  DATE OF BIRTH:  1931/03/29   DATE OF ADMISSION:  03/13/2007  DATE OF DISCHARGE:  03/15/2007                               DISCHARGE SUMMARY   PROCEDURES:  1. Cardiac catheterization.  2. Coronary arteriogram.  3. Left ventriculogram.  4. PTCA and stent to 1 vessel with a bare metal stent.   PRIMARY DIAGNOSIS:  Non-ST segment elevation myocardial infarction   SECONDARY DIAGNOSIS:  1. Atrial fibrillation  2. Anticoagulation with Coumadin started this admission.  3. Dyslipidemia with a total cholesterol of 114, triglycerides 73, HDL      32, LDL 67.  4. Hypertension.  5. Allergy or intolerance to penicillin.  6. Status post cholecystectomy, knee surgery and cataract surgery.  7. Osteoarthritis.   TIME AT DISCHARGE:  48 minutes.   HOSPITAL COURSE:  Ms. Blakeman is a 77 year old female with no previous  history of coronary artery disease.  She is admitted by internal  medicine for new onset atrial fib and a cardiac consult was called.  She  also had some chest pain and troponins increased to 0.67.  Her CK-MB is  elevated slightly as well.  Her rate was well controlled and a cardiac  catheterization was performed to further define her anatomy.   There was a 99% lesion in the OM, which was treated with a bare metal  stent reducing the stenosis to zero.  Dr. Samule Ohm evaluated the other  films and felt that she needed an exercise treadmill Myoview in  approximately 6 weeks to assess possible RCA stenosis.  She had  initially been in atrial flutter but Dr. Samule Ohm felt this was less  organized and recommended anticoagulation with Coumadin, which was  begun.  She will not need cross coverage with heparin or Lovenox.   A lipid profile was performed with the results  described above.  No  current changes are being made in her Vytorin and she is to follow up  with cardiology.  A hemoglobin A1C was significantly elevated at 8.6.  Her Glucophage was increased from 500 to 1000 mg twice daily and she is  to continue her home Lantus dose.  She is to follow up with Dr. Alwyn Ren  to continue diabetic management.  A D-dimer had been performed and was  significantly elevated so a CT of the chest was performed with contrast.  There was no evidence of pulmonary emboli.  She had small mediastinal  and hilar lymph nodes but no adenopathy.  She had patchy ground glass  opacity in the lungs and it was unclear if this was mild edema or other  partial air space filling process, such as alveolitis.  The radiologist  felt that the pulmonary nodules should be followed up by repeat chest CT  in six months without contrast.  Changes of COPD were noted.   By March 15, 2007 Ms. Mao had been seen by cardiac rehab and  referred for outpatient cardiac rehab.  She was evaluated by Dr. Samule Ohm  and considered stable for discharge with outpatient follow up arranged.   DISCHARGE INSTRUCTIONS:  Her activity level is to be increased  gradually.  She is not to do any lifting for 2 weeks and no driving for  4 days.  She is to stick to a diabetic diet.  She is to get a Coumadin  check on March 25th at 11 a.m. and she is to follow up with Dr. Samule Ohm  on April 10th at 9:30.  She is to follow up with Dr. Alwyn Ren as well.   DISCHARGE MEDICATIONS:  1. Plavix 75 mg a day for 30 days.  2. Aspirin 325 mg a day, decrease to 81 mg daily in 30 days.  3. Coumadin 5 mg daily or as directed in p.m.  4. Nitroglycerin sublingual as needed.  5. Metformin 1000 mg twice daily, restart March 16, 2007.  6. Hydrochlorothiazide 25 mg a day.  7. Vytorin 10/20 daily.  8. Atenolol 100 mg daily.  9. Activella 0.625 daily.  10.Lantus 10 units daily.  11.Eye drops prior to admission.  12.Vitamins prior to  admission.      Theodore Demark, PA-C      Salvadore Farber, MD  Electronically Signed    RB/MEDQ  D:  03/15/2007  T:  03/15/2007  Job:  [id number]   cc:   Titus Dubin. Alwyn Ren, MD,FACP,FCCP

## 2011-05-14 NOTE — Consult Note (Signed)
[name], [name]             ACCOUNT NO.:  [id number]   MEDICAL RECORD NO.:  [id number]          PATIENT TYPE:  INP   LOCATION:  3739                         FACILITY:  [facility]   PHYSICIAN:  Gerrit Friends. Dietrich Pates, MD, FACCDATE OF BIRTH:  1931/06/21   DATE OF CONSULTATION:  03/13/2007  DATE OF DISCHARGE:                                 CONSULTATION   PRIMARY CARE PHYSICIAN:  Titus Dubin. Hopper, MD,FACP,FCCP   CHIEF COMPLAINT:  Atrial fibrillation with elevated cardiac markers.   HISTORY OF PRESENT ILLNESS:  This is a 77 year old woman with history of  diabetes mellitus, hypertension, and hyperlipidemia who had an episode  of chest tightness but no pain, associated with feeling of racing heart  rate yesterday morning while fixing breakfast.  She states that she was  slightly diaphoretic but became profoundly short of breath to the point  that she could not really talk.  She denies any nausea, vomiting, or  radiating discomfort or pain.  She states she sat down, and her symptoms  spontaneously resolved after approximately 5 minutes.  She has never had  similar symptoms in the past.  She denies any history of focal weakness,  transient blindness, or dysarthria.   The patient saw Dr. Alwyn Ren, her primary care physician, as already  previously scheduled today and happened to mention this episode to him.  He checked an EKG today in clinic which showed atrial fibrillation.  As  a result, he had her admitted for further workup and evaluation.   REVIEW OF SYSTEMS:  As above in the HPI.  Essentially denies any  lightheadedness or dizziness as well as any syncope or presyncope.  She  denies any history of strokes in the past.  The patient denies any  problems with lower extremity edema, orthopnea, or PND.   PAST MEDICAL HISTORY:  1. Diabetes mellitus.  2. Hyperlipidemia.  3. Hypertension.   ALLERGIES:  PENICILLIN.   CURRENT MEDICATIONS:  1. Hydrochlorothiazide 25 mg daily.  2. Vytorin  10/20 mg daily.  3. Atenolol 100 mg daily.  4. Glucophage 500 mg b.i.d.  5. Aspirin 81 mg daily.  6. Fish oil.  7. Insulin.  8. Gingko biloba.   SOCIAL HISTORY:  She lives in [place] alone. She is widowed.  Her  husband died 7 years ago after 50 years of marriage.  She is currently  still working as a Runner, broadcasting/film/video at [employer].  She quit smoking  tobacco in 1997.  She denies any alcohol or drugs.   FAMILY HISTORY:  Her mother died at age 4 from complications of TIA and  stroke.  Father died with an MI in his 29s.  He also had hypertension.  She has one brother who is alive with diabetes mellitus.   PHYSICAL EXAMINATION:  VITAL SIGNS:  Temperature 98.6, pulse 83,  respiratory rate 18, blood pressure 139/87.  Weight 165 pounds.  O2  saturation 96% on room air.  GENERAL:  She looks less than her stated age.  She is in no acute  distress.  HEENT:  Normocephalic and atraumatic.  Extraocular movements intact.  NECK:  Supple with no lymphadenopathy, 2+ carotid impulses with no  carotid bruits.  JVP is flat.  LUNGS:  Clear to auscultation bilaterally without wheeze, rhonchi, or  rales.  CARDIOVASCULAR:  Irregularly irregular; however, not tachycardic,  without any audible murmurs, rubs, or gallops.  ABDOMEN:  Positive bowel sounds.  Soft, nontender, nondistended without  an palpable masses.  EXTREMITIES:  2+ radial and posterior tibial pulses, symmetric  bilaterally without lower extremity edema, clubbing or cyanosis.  NEUROLOGIC:  Alert and oriented x3.  Cranial nerves II-XII intact.  She  has 5/5 motor strength throughout.   LABORATORY DATA:  White count 7.3, hematocrit 41, platelets 237.  Potassium 4.2, creatinine 0.7, glucose 121.  Troponin 0.61, CK-MB 4.2.  D-dimer is 5.74.   EKG shows atrial fibrillation/coarse atrial flutter with variable block  at a rate of 87 without any acute ST or T wave changes.   ASSESSMENT:  1. New onset atrial fibrillation.  2. Elevated  troponin, most likely secondary to #1 without any unstable      for stable angina history.  3. Diabetes mellitus.  4. Hypertension.   RECOMMENDATIONS:  1. Given the patient's age and her history of diabetes mellitus, it      would be reasonable to proceed to start her on Coumadin with a      Lovenox bridge for stroke prophylaxis with a goal INR of between 2      to 2.5.  2. Continue her on a baby aspirin at this point in time given her      other risk factors for cardiac disease.  3. Currently she is rate controlled.  As a result, I will not add any      other AV nodal agents at this point in time.  4. I would follow her blood pressure and, given her diabetes, I would      get her goal blood pressure of less than 130/80.  5. Please check a TSH and a cardiac echocardiogram to further evaluate      the etiology of her atrial fibrillation just to rule out other      etiologies.  6. Given her elevated troponin, it is most likely secondary to atrial      fibrillation with an accelerated heart rate at this point in time.      I would just continue to track her troponin's.  However, her MB is      negative and her symptom onset was yesterday.  As a result, I do      not think the represents an acute coronary syndrome, probably      just a troponin leak from demand ischemia.  She does not give any      unstable angina or history of typical angina symptoms at baseline.   Please feel free to call us with any further questions. We will continue  to follow the patient with you.     ______________________________  Bimal R. Sherryll Burger, MD      Gerrit Friends. Dietrich Pates, MD, [facility]  Electronically Signed    BRS/MEDQ  D:  03/13/2007  T:  03/13/2007  Job:  [id number]

## 2011-05-14 NOTE — Assessment & Plan Note (Signed)
[facility]                            CARDIOLOGY OFFICE NOTE   [name], [name]                    MRN:          [id number]  DATE:04/06/2007                            DOB:          19-Jul-1931    PRIMARY CARE PHYSICIAN:  Titus Dubin. Alwyn Ren, MD,FACP,FCCP.   HISTORY OF PRESENT ILLNESS:  [name] is a 77 year old lady who  presented with non ST elevation myocardial infarction and new onset  atrial flutter on March 13, 2007.  Catheterization demonstrated a 99%  stenosis in the large first marginal which I treated with a bare metal  stent.  She also had a possible moderate stenosis in the RCA.  She had  atrial flutter but also some atrial fibrillation during hospitalization.  We have been managing that with rate control and anticoagulation.   Since discharge, she has not had any recurrence of her chest discomfort.  She does have dyspnea with moderate levels of exertion which is new.  She has not had any dyspnea at rest and no orthopnea, PND, or edema.  She has had no syncope or presyncope.   CURRENT MEDICATIONS:  1. Metformin 1000 mg twice daily.  2. HCTZ 12.5 mg daily.  3. Calcium.  4. Vitamin D.  5. Aspirin 325 mg daily.  6. Plavix 75 mg daily.  7. Folate 400 mcg daily.  8. Atenolol 100 mg daily.  9. Coumadin as followed by our [facility].  10.Vytorin 10/20 one daily.  11.Activella once daily.   PHYSICAL EXAMINATION:  GENERAL:  Well-appearing in no distress with  heart rate 71, blood pressure 160/78, and weight of 164 pounds.  She has  no jugular venous distention, thyromegaly, or lymphadenopathy.  LUNGS:  Clear to auscultation.  Respiratory effort is normal.  She has a nondisplaced point of maximal cardiac impulse.  There is an  irregularly irregular rhythm without murmur.  There is no S3.  ABDOMEN:  Soft, nondistended, nontender.  There is no hepatosplenomegaly  or midline pulsatile mass.  Bowel sounds are normal and there is  no  abdominal bruit.  EXTREMITIES:  Warm and without edema.   IMPRESSION/RECOMMENDATIONS:  1. Atrial flutter/fibrillation:  In atrial flutter today; did have      some less organized atrial activity during hospitalization.  She is      to see Dr. Ladona Ridgel next week for discussion regarding possible merit      of ablation of her flutter.  In the interim continue her      anticoagulation.  I should note that her international normalized      ratio was only 1.3 last week.  Coumadin dose has since been      adjusted.  2. Coronary disease:  Doing nice after stenting of the marginal.  I      could not selectively engage her right coronary artery which had a      aberrant take off from above the left main.  Nonselective angiogram      suggested at least a moderate stenosis at the acute margin.  After      all other problems are resolved  plan exercise test to assess this.      Unless her shortness of breath persists after establishment of      sinus rhythm, would not proceed with intervention.  3. Hypertension:  Increase Atenolol.  4:  Atrial fibrillation:  Rate is improved but still not optimal.  Will  increase Atenolol to 100 mg in the morning and 50 mg in the evening in  the hope of helping her shortness of breath.     Salvadore Farber, MD  Electronically Signed    WED/MedQ  DD: 04/06/2007  DT: 04/06/2007  Job #: [id number]   cc:   Titus Dubin. Alwyn Ren, MD,FACP,FCCP

## 2011-05-14 NOTE — Op Note (Signed)
NAME:  [name], [name]                       ACCOUNT NO.:  [id number]   MEDICAL RECORD NO.:  [id number]                   PATIENT TYPE:  AMB   LOCATION:  DAY                                  FACILITY:  [facility]   PHYSICIAN:  Georges Lynch. Darrelyn Hillock, M.D.             DATE OF BIRTH:  October 13, 1931   DATE OF PROCEDURE:  12/26/2003  DATE OF DISCHARGE:                                 OPERATIVE REPORT   PREOPERATIVE DIAGNOSES:  1. Complex tear of the posterior horn of the medial meniscus, right knee.  2. Degenerative arthritis, right knee.   POSTOPERATIVE DIAGNOSES:  1. Complex tear of the posterior horn of the medial meniscus, right knee.  2. Degenerative arthritis, right knee.   OPERATIONS:  1. Diagnostic arthroscopy, right knee.  2. Partial medial meniscectomy, right knee.  3. Abrasion chondroplasty, medial femoral condyle, right knee.  4. Abrasion chondroplasty, patella, right knee.   SURGEON:  Georges Lynch. Darrelyn Hillock, M.D.   ASSISTANT:  Nurse.   DESCRIPTION OF PROCEDURE:  Under general anesthesia, routine orthopedic prep  and draping of the right lower extremity was carried out.  She had 1 g of IV  Ancef.  A small punctate incision made in the suprapatellar pouch, inflow  cannula was inserted, and the knee was distended with saline.  Following  that another small punctate incision was made in the anterior lateral joint.  The arthroscope was entered.  A complete diagnostic arthroscopy was carried  out.  She had severe degenerative arthritis of her knee.  She had a complete  complex tear of the medial meniscus.  I introduced a shaver suction device  to the medial meniscectomy and I also utilized the basket forceps.  I did an  abrasion chondroplasty of the medial femoral condyle, went up under the  patella, and did abrasion chondroplasty of the patella.  Note she had  significant arthritis in her knee, and she is going to need a total knee  replacement in the future.  I thoroughly irrigated out  the knee after  inspecting the entire knee joint and noted also the arthritic changes in the  lateral joint space, but her lateral meniscus was intact.  After removing  all the fluid, I closed all three punctate incisions with 3-0 nylon suture.  I injected 30 mL 0.5% Marcaine and epinephrine into the knee joint and at  this time I applied a sterile Neosporin bundle dressing.  The patient left  the operating room in satisfactory condition.                                               Ronald A. Darrelyn Hillock, M.D.    RAG/MEDQ  D:  12/26/2003  T:  12/26/2003  Job:  [id number]

## 2011-05-14 NOTE — Cardiovascular Report (Signed)
[name]             ACCOUNT NO.:  [id number]   MEDICAL RECORD NO.:  [id number]          PATIENT TYPE:  INP   LOCATION:  6523                         FACILITY:  [facility]   PHYSICIAN:  Salvadore Farber, MD  DATE OF BIRTH:  05-15-1931   DATE OF PROCEDURE:  03/14/2007  DATE OF DISCHARGE:  03/15/2007                            CARDIAC CATHETERIZATION   PROCEDURE:  1. Coronary angiography.  2. Bare-metal stenting of the first obtuse marginal.  3. Left heart catheterization.  4. Angio-Seal closure of the right common femoral arteriotomy site.   INDICATIONS:  [name] is a 77 year old lady without prior history of  cardiac disease, who presented with new-onset atrial fibrillation with  moderately increased ventricular rate.  She also had some chest  discomfort.  Troponins were checked and found to be elevated at 0.67.  Cardiac catheterization was recommended.   PROCEDURAL TECHNIQUE:  Informed consent was obtained.  Underwent 1%  lidocaine local anesthesia, a 5-French sheath was placed in the right  common femoral artery using a modified Seldinger technique.  Diagnostic  angiography of the left system was performed using a JL-4 catheter.  I  was unable to selectively engage the RCA due to an extremely high  anterior takeoff.  I tried multiple catheters including AL-1 an AL-2  catheters, but was unable to engage it.  I was able to image it  subselectively.  Left heart catheterization was performed.  Ventriculography was not performed to minimize use of contrast.   Images demonstrated 99% stenosis of the first obtuse marginal branch  with TIMI-1 flow.  We therefore proceeded to percutaneous  revascularization of this.   Anticoagulation was initiated with bivalirudin.  ACT was confirmed to be  greater than 225 seconds.  The sheath was upsized over a wire to 6-  [place].  A 6-French Voda left 3.5 guide was advanced over a wire and  engaged in the ostium of the left main.  A  Prowater wire was advanced to  the distal vessel without difficulty.  The lesion was predilated using a  2.75 x 12-mm Maverick at 6 atmospheres.  It was then stented using a 3 x  15-mm Liberte deployed at 14 atmospheres.  I postdilated the stent using  a 3.25 x 15-mm Quantum at 16 atmospheres.  Final angiography  demonstrated no residual stenosis, no dissection, and improvement in  TIMI flow from 1 to 3.   The arteriotomy was then closed using the AngioSeal device.  Complete  hemostasis was obtained.  The patient was then transferred to the  holding room in stable condition, having tolerated the procedure well.   COMPLICATIONS:  None.   FINDINGS:  1. LV:  148/12/20.  No aortic stenosis on pullback.  2. Left main:  Angiographically normal.  3. LAD:  Moderate-sized vessel giving rise to a single large diagonal.      There is a 20% stenosis of the proximal LAD.  4. Circumflex:  A moderate-sized vessel giving rise to 2 marginals.      The first marginal had a 99% stenosis proximally with TIMI-1 flow.  This was treated with a bare-metal stent with reduction of the      stenosis to 0% and improvement in flow to TIMI-3.  5. RCA:  The vessel has an extremely high anterior origin, very near      the left main.  I was unable to selectively engage it.  There is      clearly TIMI-3 flow distally.  There is question of moderate      stenosis at the acute margin.   IMPRESSION/RECOMMENDATIONS:  Successful revascularization of the culprit  lesion in the marginal using a bare-metal stent.  At least moderate  stenosis of the right coronary artery, which we will assess with stress  test after full recovery.      Salvadore Farber, MD  Electronically Signed     WED/MEDQ  D:  03/24/2007  T:  03/25/2007  Job:  [id number]

## 2011-05-14 NOTE — Assessment & Plan Note (Signed)
[facility]                         ELECTROPHYSIOLOGY OFFICE NOTE   NAME:[name] [name] NEBERGALL                    MRN:          [id number]  DATE:04/14/2007                            DOB:          [birth year]    SUBJECTIVE:  [name] [name] is referred today by Dr. Salvadore Farber for  the evaluation of atrial flutter.  The patient is a very pleasant 77-  year-old woman with a history of atypical chest pain, who presented for  evaluation and was subsequently found to have atrial flutter as well.  She underwent a cardiac catheterization and has a very tight OM-I  lesion, which was treated with a bare metal stent.  The patient has  continued to have her atrial arrhythmias.  She returns today for  evaluation.   PAST MEDICAL HISTORY:  1. Diabetes.  2. Hypertension.  3. Dyslipidemia.   FAMILY HISTORY:  Is notable for her mother dying at age 30, of a stroke.  Father died of complications of a myocardial infarction in his 40's.  He  had hypertension.   SOCIAL HISTORY:  The patient lives in [place].  She is widowed.  She  teaches school.  She has a history of tobacco use, stopping smoking in  1997.  She denies alcohol abuse.   REVIEW OF SYSTEMS:  Notable for fatigue and weakness and shortness of  breath with exertion.  She has not had any particular chest pain.  She  had diaphoresis at the time of her myocardial infarction but has not had  any since.  The rest of her review of systems were reviewed and found to  be negative.   MEDICATIONS:  1. Metformin.  2. Hydrochlorothiazide.  3. Aspirin.  4. Plavix.  5. Folate.  6. Atenolol.  She is on 150 mg in divided doses.  7. Coumadin.  8. Vytorin.   PHYSICAL EXAMINATION:  GENERAL:  She is a pleasant well-appearing 79-  year-old woman, in no distress.  VITAL SIGNS:  Blood pressure 180/80, pulse 84 and regular, respirations  18, weight 167 pounds.  HEENT:  Normocephalic and atraumatic.  Pupils equal and  round.  Oropharynx moist.  Sclerae anicteric.  NECK:  No jugular venous distention.  No thyromegaly.  Trachea midline.  Carotids are 2+ and symmetric.  LUNGS: Clear bilaterally to auscultation.  No wheezes, rales or rhonchi.  There is no increased work of breathing.  CARDIOVASCULAR:  A regular rate and rhythm with a normal S1 and S2.  There is a soft S4 gallop present.  The PMI was not enlarged nor  laterally displaced.  ABDOMEN:  Soft, nontender.  No organomegaly.  Bowel sounds present.  No  rebound or guarding.  EXTREMITIES:  No clubbing, cyanosis or edema.  The pulses were 2+ and  symmetric.  NEUROLOGIC:  Alert and oriented x3 with cranial nerves intact.  Strength  was 5/5 and symmetric.   Electrocardiogram demonstrates atrial flutter with a controlled  ventricular response.  The flutter waves are positive in lead V1 and the  inferior leads show what appear to be negative flutter waves, although  the morphology is somewhat difficult  to appreciate.   IMPRESSION:  1. Atrial flutter.  2. Coronary artery disease, status post stent to the obtuse marginal      branch.  3. Hypertension.  4. Dyslipidemia.  5. Coumadin therapy.   DISCUSSION:  I have discussed the treatment options with the patient,  and the risks, benefits, goals and expectation of an  electrophysiological study.  The patient is considering her options and  will call us if she would like to proceed with ablation.     Doylene Canning. Ladona Ridgel, MD  Electronically Signed    GWT/MedQ  DD: 04/14/2007  DT: 04/15/2007  Job #: [id number]   cc:   Salvadore Farber, MD  Titus Dubin. Alwyn Ren, MD,FACP,FCCP

## 2011-05-25 ENCOUNTER — Telehealth: Payer: Self-pay | Admitting: Internal Medicine

## 2011-05-25 MED ORDER — INSULIN GLARGINE 100 UNIT/ML ~~LOC~~ SOLN: 15.0000 [IU] | Freq: Every day | SUBCUTANEOUS | Status: DC

## 2011-05-25 NOTE — Telephone Encounter (Signed)
Pharm did not receive, sent in meds.

## 2011-05-26 ENCOUNTER — Other Ambulatory Visit: Payer: Self-pay

## 2011-05-26 MED ORDER — METFORMIN HCL ER 500 MG PO TB24: ORAL_TABLET | ORAL | Status: DC

## 2011-07-10 ENCOUNTER — Encounter: Payer: Self-pay | Admitting: Internal Medicine

## 2011-07-13 ENCOUNTER — Encounter: Payer: Self-pay | Admitting: Internal Medicine

## 2011-07-13 ENCOUNTER — Ambulatory Visit (INDEPENDENT_AMBULATORY_CARE_PROVIDER_SITE_OTHER): Payer: Medicare Other | Admitting: Internal Medicine

## 2011-07-13 DIAGNOSIS — L723 Sebaceous cyst: Secondary | ICD-10-CM

## 2011-07-13 DIAGNOSIS — I4892 Unspecified atrial flutter: Secondary | ICD-10-CM

## 2011-07-13 DIAGNOSIS — L720 Epidermal cyst: Secondary | ICD-10-CM

## 2011-07-13 DIAGNOSIS — E782 Mixed hyperlipidemia: Secondary | ICD-10-CM

## 2011-07-13 DIAGNOSIS — E119 Type 2 diabetes mellitus without complications: Secondary | ICD-10-CM

## 2011-07-13 DIAGNOSIS — D510 Vitamin B12 deficiency anemia due to intrinsic factor deficiency: Secondary | ICD-10-CM

## 2011-07-13 DIAGNOSIS — H18519 Endothelial corneal dystrophy, unspecified eye: Secondary | ICD-10-CM

## 2011-07-13 DIAGNOSIS — Z0000 Encounter for general adult medical examination without abnormal findings: Secondary | ICD-10-CM

## 2011-07-13 DIAGNOSIS — I10 Essential (primary) hypertension: Secondary | ICD-10-CM

## 2011-07-13 MED ORDER — DOXYCYCLINE HYCLATE 100 MG PO TABS: 100.0000 mg | ORAL_TABLET | Freq: Two times a day (BID) | ORAL | Status: AC

## 2011-07-13 MED ORDER — MUPIROCIN CALCIUM 2 % EX CREA: TOPICAL_CREAM | CUTANEOUS | Status: DC

## 2011-07-13 NOTE — Assessment & Plan Note (Signed)
A1c was 7.6% in May of 2001; it was 6.8 in June 2001

## 2011-07-13 NOTE — Progress Notes (Signed)
Subjective:    Patient ID: [name], female    DOB: October 13, 1931, 77 y.o.   MRN: [id number]  HPI Medicare Wellness Visit:  The following psychosocial & medical history were reviewed as required by Medicare.   Social history: caffeine: 1 cup coffee/ day , alcohol:  no ,  tobacco use : quit 1982  & exercise : sedentary post Opth surgery; water & walking restarted 2 weeks ago.   Home & personal  safety / fall risk: no issues, activities of daily living: no limitations , seatbelt use : yes , and smoke alarm employment : yes .  Power of Attorney/Living Will status : not signed  Vision ( as recorded per Nurse) & Hearing  evaluation :  Seeing [facility] Ophth .Whisper heard @ 6 ft. Orientation :oriented X3 , memory & recall :good, spelling or math testing: good,and mood & affect : normal . Depression / anxiety: denied Travel history : [address] [place] , immunization status :? Pneumovax needed , transfusion history:  no, and preventive health surveillance ( colonoscopies, BMD , etc as per protocol/ SOC):colonoscopy  due, Dental care:  Every 6 mos . Chart reviewed &  Updated. Active issues reviewed & addressed.       Review of Systems #1 HYPERTENSION: Disease Monitoring: Blood pressure range-?  Chest pain, palpitations- no       Dyspnea- only with  Excess walking Medications: Compliance- yes  Lightheadedness,Syncope- no    Edema- no  #2 DIABETES: Disease Monitoring: Blood Sugar ranges-90-140  Polyuria/phagia/dipsia- no       Medications: Compliance- yes  Hypoglycemic symptoms- no   #3 HYPERLIPIDEMIA: Disease Monitoring: See symptoms for Hypertension Medications: Compliance- yes  Abd pain, bowel changes- no   Muscle aches- no   She's had a subcutaneous lesion behind  the left ear for years ; recently it has increased in size and is causing pain.        Objective:   Physical Exam Gen.: Healthy and well-nourished in appearance. Alert, appropriate and cooperative  throughout exam. Appears younger than age  Head: Normocephalic without obvious abnormalities Ears: External  ear exam reveals no significant lesions or deformities. Canals clear .TMs normal. Hearing is grossly normal bilaterally. Nose: External nasal exam reveals no deformity or inflammation. Nasal mucosa are pink and moist. No lesions or exudates noted. Mouth: Oral mucosa and oropharynx reveal no lesions or exudates. Teeth in good repair; upper & lower partials. Neck: No deformities, masses, or tenderness noted. Range of motion &. Thyroid normal. Lungs: Normal respiratory effort; chest expands symmetrically. Lungs are clear to auscultation without rales, wheezes, or increased work of breathing. Decreased BS Heart: Normal rate and irregular  rhythm. Normal S1 and S2. No gallop, click, or rub. No  murmur. Abdomen: Bowel sounds normal; abdomen soft and nontender. No masses, organomegaly or hernias noted. Genitalia: Dr Henderson Cloud   .                                                                                   Musculoskeletal/extremities: No deformity or scoliosis noted of  the thoracic or lumbar spine. No clubbing, cyanosis, edema, or deformity noted. Range of motion  normal .Tone &  strength  normal.Joints : DIP OA changes. Nail health  good. Vascular: Carotid, radial artery, dorsalis pedis and  posterior tibial pulses are full and equal. No bruits present. Neurologic: Alert and oriented x3. Deep tendon reflexes symmetrical and normal.  Light touch normal over feet.  Skin: Intact without suspicious lesions or rashes. There appears to be an epidermal inclusion cyst in the left mastoid area which is swollen and tender to touch. Lymph: No cervical, axillary, or inguinal lymphadenopathy present. Psych: Mood and affect are normal. Normally interactive                                                                                         Assessment & Plan:  #1 Medicare Wellness Exam; criteria met ;  data entered #2 Problem List reviewed ; Assessment/ Recommendations made  #3 epidermoid inclusion cyst left mastoid area, infected  #4 Irregular rhythm,  R/O  recurrent atrial fib.  EKG reveals atrial flutter. I will notify Dr. Ladona Ridgel her cardiologist. She apparently is going to see him in August; he may wish to see her sooner.  She's asked me to refer her to  [facility] for colonoscopy. She previously was  seeing a gastroenterologist in [facility] but has  moved back to [place]. The atrial flutter should be addressed  Before pursuing this    Plan: See orders

## 2011-07-13 NOTE — Assessment & Plan Note (Signed)
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

## 2011-07-13 NOTE — Patient Instructions (Addendum)
Preventive Health Care: Exercise  30-45  minutes a day, 3-4 days a week. Walking is especially valuable in preventing Osteoporosis. Eat a low-fat diet with lots of fruits and vegetables, up to 7-9 servings per day. Avoid obesity; your goal = waist less than 35 inches.Consume less than 30 grams of sugar per day from foods & drinks with High Fructose Corn Syrup as # 1,2,3 or #4 on label. Health Care Power of Attorney & Living Will place you in charge of your health care  decisions. Verify these are  in place.  Poor diabetic control = A1c  greater than 7 % ( except with additional factors such as  advanced age; significant coronary or neurologic disease,etc). Check the A1c every 6 months if it is < 6.5%; every 4 months if  6.5% or higher. Goals for home glucose monitoring are : fasting  or morning glucose goal of  90-150. Two hours after any meal , goal = < 180, preferably < 150.    colonoscopy should be deferred until the atrial flutter has been  addressed

## 2011-07-13 NOTE — Assessment & Plan Note (Signed)
B12  > 1500 in 6/11

## 2011-07-14 LAB — CBC WITH DIFFERENTIAL/PLATELET
HCT: 38.5 % (ref 36.0–46.0)
Hemoglobin: 12.8 g/dL (ref 12.0–15.0)
MCV: 90.4 fl (ref 78.0–100.0)
RBC: 4.26 Mil/uL (ref 3.87–5.11)
WBC: 3.2 10*3/uL — ABNORMAL LOW (ref 4.5–10.5)

## 2011-07-14 LAB — HEPATIC FUNCTION PANEL
ALT: 15 U/L (ref 0–35)
AST: 26 U/L (ref 0–37)
Albumin: 4.5 g/dL (ref 3.5–5.2)

## 2011-07-14 LAB — BASIC METABOLIC PANEL
Chloride: 105 mEq/L (ref 96–112)
GFR: 75.05 mL/min (ref >60.00)
Glucose, Bld: 111 mg/dL — ABNORMAL HIGH (ref 70–99)
Potassium: 4.9 mEq/L (ref 3.5–5.1)
Sodium: 144 mEq/L (ref 135–145)

## 2011-07-14 LAB — VITAMIN B12: Vitamin B-12: 580 pg/mL (ref 211–911)

## 2011-07-14 LAB — HEMOGLOBIN A1C: Hgb A1c MFr Bld: 8.3 % — ABNORMAL HIGH (ref 4.6–6.5)

## 2011-07-14 LAB — LIPID PANEL
Cholesterol: 137 mg/dL (ref 0–200)
HDL: 48.4 mg/dL (ref >39.00)
LDL Cholesterol: 74 mg/dL (ref 0–99)
VLDL: 15 mg/dL (ref 0.0–40.0)

## 2011-07-16 ENCOUNTER — Encounter: Payer: Self-pay | Admitting: Physician Assistant

## 2011-07-19 ENCOUNTER — Encounter: Payer: Self-pay | Admitting: Physician Assistant

## 2011-07-20 ENCOUNTER — Encounter: Payer: Self-pay | Admitting: Physician Assistant

## 2011-07-21 ENCOUNTER — Ambulatory Visit (INDEPENDENT_AMBULATORY_CARE_PROVIDER_SITE_OTHER): Payer: Medicare Other | Admitting: Physician Assistant

## 2011-07-21 ENCOUNTER — Encounter: Payer: Self-pay | Admitting: Physician Assistant

## 2011-07-21 VITALS — BP 142/76 | HR 70 | Ht 64.0 in | Wt 164.0 lb

## 2011-07-21 DIAGNOSIS — I2510 Atherosclerotic heart disease of native coronary artery without angina pectoris: Secondary | ICD-10-CM

## 2011-07-21 DIAGNOSIS — I509 Heart failure, unspecified: Secondary | ICD-10-CM

## 2011-07-21 DIAGNOSIS — I4892 Unspecified atrial flutter: Secondary | ICD-10-CM

## 2011-07-21 DIAGNOSIS — I4891 Unspecified atrial fibrillation: Secondary | ICD-10-CM

## 2011-07-21 DIAGNOSIS — I10 Essential (primary) hypertension: Secondary | ICD-10-CM

## 2011-07-21 NOTE — Assessment & Plan Note (Signed)
She is not having any angina.  She is not on aspirin secondary to Pradaxa.

## 2011-07-21 NOTE — Progress Notes (Signed)
History of Present Illness: Primary Electrophysiologist:  Dr. Lewayne Bunting  [name] is a 77 y.o. female who is referred back by her PCP for possible recurrent atrial arrhythmia.  She has a history of atrial fibrillation and atrial flutter.  She underwent radiofrequency catheter ablation in 2008.  She was previously on amiodarone for her atrial fibrillation and was maintaining sinus rhythm.  Amiodarone had to be stopped due to lung toxicity.  She was last seen by Dr. Ladona Ridgel several months ago.  At that time, he recommended considering Tikosyn should she have recurrent atrial fibrillation.  She also has CAD.  She is status post bare-metal stenting to the first obtuse marginal in March 2008.  This wa in the setting of a non-ST elevation myocardial infarction.  She has diastolic heart failure, diabetes, hyperlipidemia, hypertension.  She was basically unaware that she was back out of rhythm.  She denies palpitations.  She has felt fatigued.  She denies any significant increased dyspnea with exertion.  She denies orthopnea or PND.  She denies edema.  She denies syncope or lightheadedness.  Past Medical History  Diagnosis Date  . Hypopotassemia   . Anemia     iron defficiency  . Chest pain   . Fatigue   . Dyspnea     shortness of breath  . Type II or unspecified type diabetes mellitus without mention of complication, uncontrolled   . Muscle pain   . Degenerative joint disease   . Atrial fibrillation or flutter     s/p RFCA 7/08;   s/p DCCV in past;   previously on amiodarone;  amio stopped due to lung toxicity  . CAD (coronary artery disease)     s/p NSTEMI tx with BMS to OM1 3/08;  cath 3/08: pOM 99% tx with PCI, pLAD 20%, ? mod stenosis at the AM  . HLD (hyperlipidemia)   . HTN (hypertension)     essential nos  . Dystrophy, corneal stromal   . Chronic diastolic heart failure     echo 11/11:  EF 55-60%, severe LVH, mod LAE, mild MR, mildly increased PASP    Current Outpatient  Prescriptions  Medication Sig Dispense Refill  . Calcium Carbonate-Vitamin D (CALCIUM 600 + D PO) Take by mouth daily.        . carvedilol (COREG) 25 MG tablet Take 50 mg by mouth 2 (two) times daily with a meal.       . Cholecalciferol (VITAMIN D3) 1000 UNITS CAPS Take by mouth daily.        . dabigatran (PRADAXA) 150 MG CAPS Take 1 capsule (150 mg total) by mouth every 12 (twelve) hours.  180 capsule  3  . doxycycline (VIBRA-TABS) 100 MG tablet Take 1 tablet (100 mg total) by mouth 2 (two) times daily.  14 tablet  0  . ferrous sulfate 325 (65 FE) MG tablet Take 325 mg by mouth daily with breakfast.        . folic acid (FOLVITE) 400 MCG tablet Take 400 mcg by mouth daily.        Marland Kitchen FREESTYLE LITE test strip Use as instructed  100 each  1  . furosemide (LASIX) 40 MG tablet Take 1 tablet (40 mg total) by mouth daily.  30 tablet  9  . insulin glargine (LANTUS SOLOSTAR) 100 UNIT/ML injection Inject 15 Units into the skin at bedtime.       . Insulin Pen Needle (B-D ULTRAFINE III SHORT PEN) 31G X 8 MM MISC by  Does not apply route daily.        . metFORMIN (GLUCOPHAGE-XR) 500 MG 24 hr tablet 1 tablet in am, 2 tablets in evening.  93 tablet  1  . mupirocin (BACTROBAN) 2 % cream Apply to affected area 3 times daily  15 g  0  . nitroGLYCERIN (NITROSTAT) 0.4 MG SL tablet Place 0.4 mg under the tongue every 5 (five) minutes as needed.        . OMEGA 3 1000 MG CAPS Take by mouth.        . potassium chloride SA (KLOR-CON M20) 20 MEQ tablet Take 20 mEq by mouth daily.        . rosuvastatin (CRESTOR) 20 MG tablet Take 20 mg by mouth daily.        Marland Kitchen tiotropium (SPIRIVA HANDIHALER) 18 MCG inhalation capsule Place 18 mcg into inhaler and inhale daily.          Allergies: Allergies  Allergen Reactions  . Amlodipine Besylate     REACTION: tingling in lips  . Codeine   . Lobster (Shellfish Allergy)   . Penicillins     REACTION: rash  . Tramadol Hcl     REACTION: nausea  . Valsartan     REACTION:  angioedema    Social history:  Nonsmoker  ROS:  Please see history of present illness.  She denies fevers, chills, cough, melena, hematochezia.  All other systems reviewed and negative.  Vital Signs: BP 142/76  Pulse 70  Ht 5\' 4"  (1.626 m)  Wt 164 lb (74.39 kg)  BMI 28.15 kg/m2  PHYSICAL EXAM: Well nourished, well developed, in no acute distress HEENT: normal Neck: no JVD Endocrine: No thyromegaly Vascular: No carotid bruits Cardiac:  normal S1, S2; Irregular rhythm; no murmur Lungs:  clear to auscultation bilaterally, no wheezing, rhonchi or rales Abd: soft, nontender, no hepatomegaly Ext: no edema Skin: warm and dry Neuro:  CNs 2-12 intact, no focal abnormalities noted  EKG:  Atrial flutter with variable AV block, heart rate 70, left axis deviation, nonspecific ST-T wave changes  ASSESSMENT AND PLAN:

## 2011-07-21 NOTE — Assessment & Plan Note (Signed)
She has recurrent atrial flutter.  She is already anticoagulated with Pradaxa.  Her rate is well controlled.  I spoke with Dr. Ladona Ridgel over the telephone.  She may be a candidate for Tikosyn.  She has an appointment with him in August.  She should keep that appointment and discuss this issue further with him at that time.  She knows to contact us if she starts to feel worse or if she feels as though her heart rate is uncontrolled.  We will get an amiodarone level.  In review of her recent labs from her PCP, her TSH was slightly low.  I will also repeat a TSH and free T4 today.

## 2011-07-21 NOTE — Assessment & Plan Note (Signed)
Volume appears stable.

## 2011-07-21 NOTE — Assessment & Plan Note (Signed)
Somewhat elevated.  Continue to monitor for now.

## 2011-07-21 NOTE — Patient Instructions (Signed)
Your physician recommends that you schedule a follow-up appointment as scheduled with Dr Ladona Ridgel  Your physician recommends that you return for lab work today  TSH/Free T4/Amio Level

## 2011-07-22 ENCOUNTER — Telehealth: Payer: Self-pay | Admitting: Physician Assistant

## 2011-07-22 NOTE — Telephone Encounter (Signed)
Returning Wynona Canes called from yesterday.

## 2011-07-22 NOTE — Telephone Encounter (Signed)
patient aware of her lab results and I forwarded them to Dr Alwyn Ren

## 2011-07-26 LAB — AMIODARONE LEVEL: Desethylamiodarone: <0.3 ug/mL — ABNORMAL LOW (ref 1.5–2.5)

## 2011-07-27 ENCOUNTER — Telehealth: Payer: Self-pay | Admitting: Internal Medicine

## 2011-07-27 ENCOUNTER — Other Ambulatory Visit: Payer: Self-pay | Admitting: Internal Medicine

## 2011-07-27 MED ORDER — FUROSEMIDE 40 MG PO TABS: 40.0000 mg | ORAL_TABLET | Freq: Every day | ORAL | Status: DC

## 2011-07-27 MED ORDER — INSULIN GLARGINE 100 UNIT/ML ~~LOC~~ SOLN: 15.0000 [IU] | Freq: Every day | SUBCUTANEOUS | Status: DC

## 2011-07-27 MED ORDER — METFORMIN HCL ER 500 MG PO TB24: ORAL_TABLET | ORAL | Status: DC

## 2011-07-27 NOTE — Telephone Encounter (Signed)
Returning call back to [place]

## 2011-07-27 NOTE — Telephone Encounter (Signed)
This is not maintenance med; why is she requesting refill ?

## 2011-07-27 NOTE — Telephone Encounter (Signed)
3 RX's sent to pharmacy, Dr.Hopper please advise on refill request for Doxy

## 2011-07-27 NOTE — Telephone Encounter (Signed)
Tried to call pt number busy will try again later.

## 2011-07-27 NOTE — Telephone Encounter (Signed)
lmom labs ok and amiodarone level is negative now. Danielle Rankin

## 2011-07-28 MED ORDER — METFORMIN HCL ER 500 MG PO TB24: ORAL_TABLET | ORAL | Status: DC

## 2011-07-28 MED ORDER — INSULIN PEN NEEDLE 31G X 8 MM MISC
Status: DC
Start: 2011-07-28 — End: 2012-02-23

## 2011-07-28 NOTE — Telephone Encounter (Signed)
Left message on voicemail for patient to return call when available, reason for call: ask patient why doxy (antibiotic) was requested (Per MD this is not a maintenance medication )

## 2011-07-28 NOTE — Telephone Encounter (Signed)
I spoke w/ pt she states that she does not need doxy. She would like a 90 day supply on her metformin.

## 2011-08-09 ENCOUNTER — Encounter: Payer: Self-pay | Admitting: Gastroenterology

## 2011-08-17 ENCOUNTER — Encounter: Payer: Self-pay | Admitting: Internal Medicine

## 2011-08-17 ENCOUNTER — Ambulatory Visit (INDEPENDENT_AMBULATORY_CARE_PROVIDER_SITE_OTHER): Payer: Medicare Other | Admitting: Internal Medicine

## 2011-08-17 ENCOUNTER — Ambulatory Visit: Payer: 59 | Admitting: Internal Medicine

## 2011-08-17 ENCOUNTER — Encounter: Payer: Self-pay | Admitting: Cardiology

## 2011-08-17 DIAGNOSIS — I2510 Atherosclerotic heart disease of native coronary artery without angina pectoris: Secondary | ICD-10-CM

## 2011-08-17 DIAGNOSIS — I4891 Unspecified atrial fibrillation: Secondary | ICD-10-CM

## 2011-08-17 DIAGNOSIS — I10 Essential (primary) hypertension: Secondary | ICD-10-CM

## 2011-08-17 MED ORDER — DIGOXIN 125 MCG PO TABS: 125.0000 ug | ORAL_TABLET | Freq: Every day | ORAL | Status: DC

## 2011-08-17 NOTE — Patient Instructions (Signed)
Your physician recommends that you schedule a follow-up appointment in: 4 months with Dr Ladona Ridgel   Your physician has recommended you make the following change in your medication: Start Digoxin 0.125mg  daily  Your physician recommends that you return for lab work in: September with Dr Alwyn Ren (Rx given)

## 2011-08-17 NOTE — Assessment & Plan Note (Signed)
Her rate is fairly well controlled. I have asked her to start digoxin along with her beta blocker.

## 2011-08-17 NOTE — Assessment & Plan Note (Signed)
She denies anginal symptoms. Continue current meds. 

## 2011-08-17 NOTE — Assessment & Plan Note (Signed)
Her blood pressure is well controlled. Continue current meds. 

## 2011-08-17 NOTE — Progress Notes (Signed)
HPI [name] returns today for followup. She is well known to me. She has a h/o atrial flutter s/p catheter ablation, h/o atrial fib, with NSR on amiodarone. She developed lung toxicity and had to have her amiodarone stopped. She saw Tereso Newcomer several weeks ago and was in a left atrial flutter/fib. She returns today for followup. She feels well. She is exercising regularly and has had no limitation. No c/p or sob or peripheral edema. She was noted to have a reduced TSH and a normal T4 and is following up with Dr. Alwyn Ren. Allergies  Allergen Reactions  . Amlodipine Besylate     REACTION: tingling in lips  . Codeine   . Lobster (Shellfish Allergy)   . Penicillins     REACTION: rash  . Tramadol Hcl     REACTION: nausea  . Valsartan     REACTION: angioedema     Current Outpatient Prescriptions  Medication Sig Dispense Refill  . Calcium Carbonate-Vitamin D (CALCIUM 600 + D PO) Take by mouth daily.        . carvedilol (COREG) 25 MG tablet Take 50 mg by mouth 2 (two) times daily with a meal.       . Cholecalciferol (VITAMIN D3) 1000 UNITS CAPS Take by mouth daily.        . dabigatran (PRADAXA) 150 MG CAPS Take 1 capsule (150 mg total) by mouth every 12 (twelve) hours.  180 capsule  3  . ferrous sulfate 325 (65 FE) MG tablet Take 325 mg by mouth daily with breakfast.        . folic acid (FOLVITE) 400 MCG tablet Take 400 mcg by mouth daily.        Marland Kitchen FREESTYLE LITE test strip Use as instructed  100 each  1  . furosemide (LASIX) 40 MG tablet Take 1 tablet (40 mg total) by mouth daily.  90 tablet  1  . insulin glargine (LANTUS SOLOSTAR) 100 UNIT/ML injection Inject 15 Units into the skin at bedtime.  10 mL  5  . Insulin Pen Needle (B-D ULTRAFINE III SHORT PEN) 31G X 8 MM MISC As directed.  100 each  1  . metFORMIN (GLUCOPHAGE-XR) 500 MG 24 hr tablet 1 tablet in am, 2 tablets in evening.  180 tablet  0  . nitroGLYCERIN (NITROSTAT) 0.4 MG SL tablet Place 0.4 mg under the tongue every 5 (five)  minutes as needed.        . OMEGA 3 1000 MG CAPS Take by mouth.        . potassium chloride SA (KLOR-CON M20) 20 MEQ tablet Take 20 mEq by mouth daily.        . rosuvastatin (CRESTOR) 20 MG tablet Take 20 mg by mouth daily.        Marland Kitchen tiotropium (SPIRIVA HANDIHALER) 18 MCG inhalation capsule Place 18 mcg into inhaler and inhale daily.           Past Medical History  Diagnosis Date  . Hypopotassemia   . Anemia     iron defficiency  . Chest pain   . Fatigue   . Dyspnea     shortness of breath  . Type II or unspecified type diabetes mellitus without mention of complication, uncontrolled   . Muscle pain   . Degenerative joint disease   . Atrial fibrillation or flutter     s/p RFCA 7/08;   s/p DCCV in past;   previously on amiodarone;  amio stopped due to lung toxicity  .  CAD (coronary artery disease)     s/p NSTEMI tx with BMS to OM1 3/08;  cath 3/08: pOM 99% tx with PCI, pLAD 20%, ? mod stenosis at the AM  . HLD (hyperlipidemia)   . HTN (hypertension)     essential nos  . Dystrophy, corneal stromal   . Chronic diastolic heart failure     echo 11/11:  EF 55-60%, severe LVH, mod LAE, mild MR, mildly increased PASP    ROS:   All systems reviewed and negative except as noted in the HPI.   Past Surgical History  Procedure Date  . Cholecystectomy   . Knee surgery   . Eye surgery     cataract surgery OD, corneal implant  . A flutter ablation   . Gallbladder surgery   . Gravid 2 para 2   . Colonoscopy 6/07    2 polyps Dr. Elvina Mattes in [place]  . Cataract surgery 4/07  . Corneal implant 9/08  . Bare metal stent 3/08  . Ablation for a flutter 5/08    dr Brady Plant      Family History  Problem Relation Age of Onset  . Diabetes Mother   . Hypertension Mother   . Transient ischemic attack Mother   . Heart attack Father 33  . Arthritis Father   . Breast cancer Maternal Aunt   . Arthritis Maternal Aunt   . Arthritis Other   . Hypertension Other   . Diabetes Other     1st degree  relative     History   Social History  . Marital Status: Widowed    Spouse Name: N/A    Number of Children: N/A  . Years of Education: N/A   Occupational History  . Not on file.   Social History Main Topics  . Smoking status: Former Smoker    Quit date: 12/27/1968  . Smokeless tobacco: Never Used   Comment: quit 1997  . Alcohol Use: No  . Drug Use: No  . Sexually Active: Not on file   Other Topics Concern  . Not on file   Social History Narrative   Teacher. Widowed. Rarely drinks cafeine.      BP 138/74  Pulse 85  Ht 5\' 6"  (1.676 m)  Wt 161 lb 12.8 oz (73.392 kg)  BMI 26.12 kg/m2  Physical Exam:  Well appearing woman, looking younger than stated age, NAD HEENT: Unremarkable Neck:  No JVD, no thyromegally Lymphatics:  No adenopathy Back:  No CVA tenderness Lungs:  Clear with no wheezes, rales, or rhonchi. HEART:  Iregular rate rhythm, no murmurs, no rubs, no clicks Abd:  soft, positive bowel sounds, no organomegally, no rebound, no guarding Ext:  2 plus pulses, no edema, no cyanosis, no clubbing Skin:  No rashes no nodules Neuro:  CN II through XII intact, motor grossly intact  EKG Atrial fibrillation with a CVR.  Assess/Plan:

## 2011-08-25 ENCOUNTER — Other Ambulatory Visit: Payer: Self-pay | Admitting: *Deleted

## 2011-08-25 MED ORDER — CARVEDILOL 25 MG PO TABS: 50.0000 mg | ORAL_TABLET | Freq: Two times a day (BID) | ORAL | Status: DC

## 2011-08-25 MED ORDER — POTASSIUM CHLORIDE CRYS ER 20 MEQ PO TBCR: 20.0000 meq | EXTENDED_RELEASE_TABLET | Freq: Every day | ORAL | Status: DC

## 2011-08-31 ENCOUNTER — Other Ambulatory Visit: Payer: Self-pay | Admitting: Internal Medicine

## 2011-09-03 ENCOUNTER — Other Ambulatory Visit: Payer: Self-pay | Admitting: Internal Medicine

## 2011-09-03 DIAGNOSIS — E039 Hypothyroidism, unspecified: Secondary | ICD-10-CM

## 2011-09-06 ENCOUNTER — Other Ambulatory Visit (INDEPENDENT_AMBULATORY_CARE_PROVIDER_SITE_OTHER): Payer: Medicare Other

## 2011-09-06 DIAGNOSIS — I4891 Unspecified atrial fibrillation: Secondary | ICD-10-CM

## 2011-09-06 DIAGNOSIS — E039 Hypothyroidism, unspecified: Secondary | ICD-10-CM

## 2011-09-06 LAB — CBC WITH DIFFERENTIAL/PLATELET
Basophils Absolute: 0 10*3/uL (ref 0.0–0.1)
Lymphocytes Relative: 23.2 % (ref 12.0–46.0)
Lymphs Abs: 1.1 10*3/uL (ref 0.7–4.0)
Monocytes Relative: 11.8 % (ref 3.0–12.0)
Neutrophils Relative %: 59.4 % (ref 43.0–77.0)
Platelets: 175 10*3/uL (ref 150.0–400.0)
RDW: 13.6 % (ref 11.5–14.6)

## 2011-09-06 LAB — T3, FREE: T3, Free: 3.5 pg/mL (ref 2.3–4.2)

## 2011-09-06 LAB — TSH: TSH: 0.07 u[IU]/mL — ABNORMAL LOW (ref 0.35–5.50)

## 2011-09-07 LAB — DIGOXIN LEVEL: Digoxin Level: 2.3 ng/mL — ABNORMAL HIGH (ref 0.8–2.0)

## 2011-09-07 LAB — HEMOGLOBIN A1C: Hgb A1c MFr Bld: 8.1 % — ABNORMAL HIGH (ref 4.6–6.5)

## 2011-09-09 ENCOUNTER — Ambulatory Visit (INDEPENDENT_AMBULATORY_CARE_PROVIDER_SITE_OTHER): Payer: Medicare Other | Admitting: Internal Medicine

## 2011-09-09 ENCOUNTER — Encounter: Payer: Self-pay | Admitting: Internal Medicine

## 2011-09-09 VITALS — BP 148/68 | HR 70 | Ht 63.5 in | Wt 161.6 lb

## 2011-09-09 DIAGNOSIS — E0590 Thyrotoxicosis, unspecified without thyrotoxic crisis or storm: Secondary | ICD-10-CM

## 2011-09-09 DIAGNOSIS — I4891 Unspecified atrial fibrillation: Secondary | ICD-10-CM

## 2011-09-09 NOTE — Progress Notes (Signed)
Subjective:    Patient ID: [name], female    DOB: November 29, 1931, 77 y.o.   MRN: [id number]  HPI Diabetes status assessment: Fasting or morning glucose range:  120-160 or average :  Not calculated  . Highest glucose 2 hours after any meal:  Not checked. Hypoglycemia :  no .                                                     Excess thirst :no;  Excess hunger:  no ;  Excess urination:  no.                                  Lightheadedness with standing:  occasionally. Chest pain:  no ; Palpitations :no ;  Pain in  calves with walking:  no .                                                                                                                                 Non healing skin  ulcers or sores,especially over the feet:  no. Numbness or tingling or burning in feet : no .                                                                                                                                              Significant change in  Weight : up 2#. Vision changes : no  .                                                                    Exercise : 3X/ week as H2O aerobics . Nutrition/diet:  Low carb. Medication compliance : yes. Medication adverse  Effects:  Digoxin causing  ? Tingling in lips occasionally . Eye exam : 3 mos ago. Foot care : none.  A1c/ urine microalbumin monitor:  8.1% ; this is  on 20 units of Lantus X 1  month  Thyroid function monitor : see TFTs; she is on no thyroid supplement and is no longer on  amiodarone. Constitutional:  Fatigue:yes; Sleep pattern:fair; Appetite:fair  Hoarseness:slight; Swallowing issues:occasionally GI:  Alternating Constipation & Diarrhea Derm: Change in nails/hair/skin:no Psych: Anxiety:no; Depression:no Endo: Temperature intolerance: Heat:yes; Cold:no      Review of Systems     Objective:   Physical Exam  Gen.: Appears younger than age;  well-nourished; in no acute distress Eyes: Extraocular motion intact; no lid lag or  proptosis Neck:  No deformities, thyromegaly, masses, or tenderness noted.   Supple with full range of motion without pain.  Lymphatic: No lymphadenopathy is noted about the head, neck, axilla areas.  Heart: Slightly irregular  rhythm and slow rate without significant murmur, gallop, or extra heart sounds Lungs: Chest clear to auscultation without rales,rales, wheezes. BS decreased Neuro:Deep tendon reflexes are equal and within normal limits; no tremor  Skin: Warm and dry without significant lesions or rashes; no onycholysis Psych: Normally communicative and interactive; no abnormal mood or affect clinically.         Assessment & Plan:  #1 diabetes; better controlled is desirable as long she is not having hypoglycemia. The A1c  goal is over 7%  #2 hyperthyroidism  #3 paroxysmal AF  #4 bowel changes, possible effects of the hyperthyroidism versus irritable bowel. The latter is suggested by intermittent constipation  Plan: #1 increase Lantus by 2 units each day [facility] WEEK  as long as he average fasting blood sugar  is over 150.  #2 thyroid uptake scan as a prelude to possible radioactive iodine treatment   #3 Dr. Ladona Ridgel will be notified of her lip tingling which she relates subjectively to digoxin.  #4 trial of Align

## 2011-09-09 NOTE — Patient Instructions (Signed)
Please take the probiotic , Align, every day until the bowels are normal. This will replace the normal bacteria which  are necessary for formation of normal stool and processing of food. 

## 2011-09-10 ENCOUNTER — Telehealth: Payer: Self-pay | Admitting: Internal Medicine

## 2011-09-10 NOTE — Telephone Encounter (Signed)
Pt calling to inform nurse that when she was last here pt was pt on digoxin and asked pt to have Dr. Alwyn Ren whom pt saw yesterday to check level of digoxin, it was 2.3. Pt said Dr. Ladona Ridgel wanted to know after a couple of weeks. Dr. Alwyn Ren said that was too high and wanted pt to not take digoxin for one day per week. Pt c/o hoarseness, really tired, dry lips, decreased appetite, and different bowels. Pt wants to discuss whether digoxin is right for pt. Dr. Alwyn Ren is sending pt to get thyroid checked, he says pt has hyperthyroidism. Pt wants to know if she should stop taking digoxin for one day as recommended by Dr. Alwyn Ren. Please return call to discuss further.

## 2011-09-10 NOTE — Telephone Encounter (Signed)
Called patient and aware  She is going to take 1/2 tablet daily and Dr Ladona Ridgel is okay with this

## 2011-09-14 ENCOUNTER — Ambulatory Visit: Payer: Medicare Other | Admitting: Gastroenterology

## 2011-09-16 ENCOUNTER — Encounter (HOSPITAL_COMMUNITY)
Admission: RE | Admit: 2011-09-16 | Discharge: 2011-09-16 | Disposition: A | Discharge status: Home / Self Care | Payer: Medicare Other | Source: Ambulatory Visit | Attending: Internal Medicine | Admitting: Internal Medicine

## 2011-09-16 ENCOUNTER — Other Ambulatory Visit: Payer: Self-pay | Admitting: Internal Medicine

## 2011-09-16 DIAGNOSIS — E0590 Thyrotoxicosis, unspecified without thyrotoxic crisis or storm: Secondary | ICD-10-CM

## 2011-09-16 MED ORDER — SODIUM PERTECHNETATE TC 99M INJECTION
10.0000 | Freq: Once | INTRAVENOUS | Status: AC | PRN
  Administered 2011-09-16: 10 via INTRAVENOUS

## 2011-09-20 ENCOUNTER — Other Ambulatory Visit: Payer: Self-pay | Admitting: Internal Medicine

## 2011-09-20 ENCOUNTER — Encounter (HOSPITAL_COMMUNITY)
Admission: RE | Admit: 2011-09-20 | Discharge: 2011-09-20 | Disposition: A | Discharge status: Home / Self Care | Payer: Medicare Other | Source: Ambulatory Visit | Attending: Internal Medicine | Admitting: Internal Medicine

## 2011-09-20 DIAGNOSIS — E0590 Thyrotoxicosis, unspecified without thyrotoxic crisis or storm: Secondary | ICD-10-CM

## 2011-09-21 ENCOUNTER — Encounter (HOSPITAL_COMMUNITY): Payer: Self-pay

## 2011-09-21 ENCOUNTER — Telehealth: Payer: Self-pay

## 2011-09-21 DIAGNOSIS — E041 Nontoxic single thyroid nodule: Secondary | ICD-10-CM

## 2011-09-21 MED ORDER — SODIUM IODIDE I 131 CAPSULE
9.8000 | Freq: Once | INTRAVENOUS | Status: AC | PRN
  Administered 2011-09-20: 9.8 via ORAL

## 2011-09-21 MED ORDER — INSULIN GLARGINE 100 UNIT/ML ~~LOC~~ SOLN: 24.0000 [IU] | Freq: Every day | SUBCUTANEOUS | Status: DC

## 2011-09-21 NOTE — Telephone Encounter (Signed)
Message copied by Edgardo Roys on Tue Sep 21, 2011  4:37 PM ------      Message from: Pecola Lawless      Created: Sat Sep 18, 2011  6:41 AM       3 overactive nodules present; none are large enough to indicate biopsy . I recommend an Endocrinology  consultation to determine optimal therapy; please inform me if you have a physician preference. [employer]

## 2011-09-21 NOTE — Telephone Encounter (Signed)
Spoke with patient, patient ok'd all information- aware copy of report mailed. Patient in agreement with referral

## 2011-09-21 NOTE — Telephone Encounter (Signed)
Rx sent 

## 2011-09-29 ENCOUNTER — Encounter: Payer: Self-pay | Admitting: *Deleted

## 2011-09-29 DIAGNOSIS — H18519 Endothelial corneal dystrophy, unspecified eye: Secondary | ICD-10-CM | POA: Insufficient documentation

## 2011-09-30 ENCOUNTER — Telehealth: Payer: Self-pay

## 2011-09-30 NOTE — Telephone Encounter (Signed)
Message copied by Edgardo Roys on Thu Sep 30, 2011 11:27 AM ------      Message from: Pecola Lawless      Created: Thu Sep 23, 2011  6:03 PM       Uptake of the iodine is actually low which is unexpected. This may be due to  thyroiditis rather than typical hyperthyroidism. I would recommend an endocrinology evaluation to assess optimal treatment. Please let me know if you have any specific preference. [employer]

## 2011-09-30 NOTE — Telephone Encounter (Signed)
Patient awaiting appointment with Dr.Ellison

## 2011-10-01 ENCOUNTER — Other Ambulatory Visit: Payer: Self-pay

## 2011-10-01 LAB — CK TOTAL AND CKMB (NOT AT ARMC)
CK, MB: 2.2 ng/mL (ref 0.3–4.0)
CK, MB: 2.4 ng/mL (ref 0.3–4.0)
Relative Index: 1.8 (ref 0.0–2.5)
Relative Index: 1.9 (ref 0.0–2.5)
Total CK: 119 U/L (ref 7–177)
Total CK: 123 U/L (ref 7–177)
Total CK: 127 U/L (ref 7–177)

## 2011-10-01 LAB — DIFFERENTIAL
Basophils Absolute: 0 10*3/uL (ref 0.0–0.1)
Basophils Relative: 0 % (ref 0–1)
Eosinophils Absolute: 0.2 10*3/uL (ref 0.0–0.7)
Eosinophils Relative: 3 % (ref 0–5)
Lymphocytes Relative: 14 % (ref 12–46)
Lymphs Abs: 0.9 10*3/uL (ref 0.7–4.0)
Monocytes Absolute: 0.4 K/uL (ref 0.1–1.0)
Monocytes Relative: 6 % (ref 3–12)
Neutro Abs: 4.9 K/uL (ref 1.7–7.7)
Neutrophils Relative %: 77 % (ref 43–77)

## 2011-10-01 LAB — GLUCOSE, CAPILLARY
Glucose-Capillary: 112 mg/dL — ABNORMAL HIGH (ref 70–99)
Glucose-Capillary: 129 mg/dL — ABNORMAL HIGH (ref 70–99)
Glucose-Capillary: 140 mg/dL — ABNORMAL HIGH (ref 70–99)
Glucose-Capillary: 142 mg/dL — ABNORMAL HIGH (ref 70–99)
Glucose-Capillary: 145 mg/dL — ABNORMAL HIGH (ref 70–99)
Glucose-Capillary: 153 mg/dL — ABNORMAL HIGH (ref 70–99)
Glucose-Capillary: 159 mg/dL — ABNORMAL HIGH (ref 70–99)
Glucose-Capillary: 161 mg/dL — ABNORMAL HIGH (ref 70–99)
Glucose-Capillary: 165 mg/dL — ABNORMAL HIGH (ref 70–99)

## 2011-10-01 LAB — BASIC METABOLIC PANEL WITH GFR
CO2: 27 meq/L (ref 19–32)
Chloride: 106 meq/L (ref 96–112)
Creatinine, Ser: 0.83 mg/dL (ref 0.4–1.2)
GFR calc Af Amer: >60 mL/min (ref >60)
Glucose, Bld: 128 mg/dL — ABNORMAL HIGH (ref 70–99)

## 2011-10-01 LAB — CBC
HCT: 26.6 % — ABNORMAL LOW (ref 36.0–46.0)
HCT: 29 % — ABNORMAL LOW (ref 36.0–46.0)
HCT: 30.1 % — ABNORMAL LOW (ref 36.0–46.0)
Hemoglobin: 9.1 g/dL — ABNORMAL LOW (ref 12.0–15.0)
Hemoglobin: 9.4 g/dL — ABNORMAL LOW (ref 12.0–15.0)
MCHC: 31.3 g/dL (ref 30.0–36.0)
MCHC: 31.3 g/dL (ref 30.0–36.0)
MCV: 78.3 fL (ref 78.0–100.0)
Platelets: 283 10*3/uL (ref 150–400)
Platelets: 304 10*3/uL (ref 150–400)
RBC: 3.71 MIL/uL — ABNORMAL LOW (ref 3.87–5.11)
RDW: 15.7 % — ABNORMAL HIGH (ref 11.5–15.5)
RDW: 16.3 % — ABNORMAL HIGH (ref 11.5–15.5)
RDW: 16.4 % — ABNORMAL HIGH (ref 11.5–15.5)
WBC: 6.4 10*3/uL (ref 4.0–10.5)

## 2011-10-01 LAB — HEPARIN LEVEL (UNFRACTIONATED)
Heparin Unfractionated: 0.66 IU/mL (ref 0.30–0.70)
Heparin Unfractionated: 0.85 IU/mL — ABNORMAL HIGH (ref 0.30–0.70)
Heparin Unfractionated: 0.89 IU/mL — ABNORMAL HIGH (ref 0.30–0.70)
Heparin Unfractionated: 0.92 IU/mL — ABNORMAL HIGH (ref 0.30–0.70)

## 2011-10-01 LAB — BASIC METABOLIC PANEL
BUN: 21 mg/dL (ref 6–23)
CO2: 30 mEq/L (ref 19–32)
Calcium: 9.4 mg/dL (ref 8.4–10.5)
Calcium: 9.7 mg/dL (ref 8.4–10.5)
GFR calc non Af Amer: >60 mL/min (ref >60)
Glucose, Bld: 146 mg/dL — ABNORMAL HIGH (ref 70–99)
Potassium: 3.6 mEq/L (ref 3.5–5.1)
Potassium: 4 mEq/L (ref 3.5–5.1)
Sodium: 138 mEq/L (ref 135–145)
Sodium: 142 mEq/L (ref 135–145)

## 2011-10-01 LAB — B-NATRIURETIC PEPTIDE (CONVERTED LAB): Pro B Natriuretic peptide (BNP): 302 pg/mL — ABNORMAL HIGH (ref 0.0–100.0)

## 2011-10-01 LAB — TROPONIN I
Troponin I: 0.01 ng/mL (ref 0.00–0.06)
Troponin I: <0.01 ng/mL (ref 0.00–0.06)
Troponin I: <0.01 ng/mL (ref 0.00–0.06)

## 2011-10-01 LAB — CARDIAC PANEL(CRET KIN+CKTOT+MB+TROPI)
CK, MB: 2.5 ng/mL (ref 0.3–4.0)
Relative Index: 2.1 (ref 0.0–2.5)
Total CK: 120 U/L (ref 7–177)
Troponin I: 0.01 ng/mL (ref 0.00–0.06)

## 2011-10-01 LAB — RETICULOCYTES: Retic Ct Pct: 2 % (ref 0.4–3.1)

## 2011-10-01 LAB — APTT
aPTT: 197 seconds — ABNORMAL HIGH (ref 24–37)
aPTT: 35 seconds (ref 24–37)

## 2011-10-01 LAB — D-DIMER, QUANTITATIVE: D-Dimer, Quant: 1.26 ug{FEU}/mL — ABNORMAL HIGH (ref 0.00–0.48)

## 2011-10-01 LAB — PROTIME-INR
INR: 1.1 (ref 0.00–1.49)
INR: 1.2 (ref 0.00–1.49)
INR: 1.3 (ref 0.00–1.49)
INR: 1.3 (ref 0.00–1.49)
Prothrombin Time: 14.8 seconds (ref 11.6–15.2)

## 2011-10-01 LAB — TSH: TSH: 1.53 u[IU]/mL (ref 0.350–4.500)

## 2011-10-01 LAB — FOLATE: Folate: >20 ng/mL

## 2011-10-01 MED ORDER — FREESTYLE LANCETS MISC: Status: DC

## 2011-10-01 NOTE — Telephone Encounter (Signed)
RX sent

## 2011-10-11 ENCOUNTER — Telehealth: Payer: Self-pay | Admitting: Internal Medicine

## 2011-10-11 NOTE — Telephone Encounter (Signed)
Pt wants samples of pradaxa, crestor, spiriva please call her with any that you have

## 2011-10-12 LAB — APTT: aPTT: 36

## 2011-10-12 LAB — PROTIME-INR
INR: 2.9 — ABNORMAL HIGH
Prothrombin Time: 32 — ABNORMAL HIGH
Prothrombin Time: 36 — ABNORMAL HIGH

## 2011-10-12 NOTE — Telephone Encounter (Signed)
We have Crestor 20mg , no Pradaxa and don't get Spiriva

## 2011-10-14 ENCOUNTER — Ambulatory Visit (INDEPENDENT_AMBULATORY_CARE_PROVIDER_SITE_OTHER): Payer: Medicare Other | Admitting: Endocrinology

## 2011-10-14 ENCOUNTER — Telehealth: Payer: Self-pay | Admitting: Internal Medicine

## 2011-10-14 ENCOUNTER — Encounter: Payer: Self-pay | Admitting: Endocrinology

## 2011-10-14 DIAGNOSIS — E042 Nontoxic multinodular goiter: Secondary | ICD-10-CM | POA: Insufficient documentation

## 2011-10-14 DIAGNOSIS — I4891 Unspecified atrial fibrillation: Secondary | ICD-10-CM

## 2011-10-14 MED ORDER — METHIMAZOLE 10 MG PO TABS: 10.0000 mg | ORAL_TABLET | Freq: Two times a day (BID) | ORAL | Status: DC

## 2011-10-14 NOTE — Patient Instructions (Addendum)
let's check a thyroid ultrasound.  you will receive a phone call, about a day and time for an appointment.  Then please call [phone] to hear your test results.  You will be prompted to enter the 9-digit "MRN" number that appears at the top left of this page, followed by #.  Then you will hear the message. i have sent a prescription to your pharmacy, for a pill to slow down the thyroid. if ever you have fever while taking methimazole, stop it and call us, because of the risk of a rare side-effect Please come back for a follow-up appointment in approx 3-4 weeks.  Please make an appointment.

## 2011-10-14 NOTE — Telephone Encounter (Signed)
Pt to come by this pm and pick up samples.  Would you check for Pradaxa samples before then and leave if you have any.

## 2011-10-14 NOTE — Progress Notes (Signed)
Subjective:    Patient ID: [name], female    DOB: 02-Feb-1931, 77 y.o.   MRN: [id number]  HPI Pt states few mos of slightly easy bruising, worst at the limbs, but no assoc brbpr.  Pt was noted to have abnormal tft approx 3 mos ago, and abnormality has been progressive since then.  Pt denies any recent iv contrast, or oral iodine intake.  However, she has been off the amiodarone x 1 year.   Past Medical History  Diagnosis Date  . Hypopotassemia   . Anemia     iron defficiency  . Chest pain   . Fatigue   . Dyspnea     shortness of breath  . Type II or unspecified type diabetes mellitus without mention of complication, uncontrolled   . Muscle pain   . Degenerative joint disease   . Atrial fibrillation or flutter     s/p RFCA 7/08;   s/p DCCV in past;   previously on amiodarone;  amio stopped due to lung toxicity  . CAD (coronary artery disease)     s/p NSTEMI tx with BMS to OM1 3/08;  cath 3/08: pOM 99% tx with PCI, pLAD 20%, ? mod stenosis at the AM  . HLD (hyperlipidemia)   . HTN (hypertension)     essential nos  . Dystrophy, corneal stromal   . Chronic diastolic heart failure     echo 11/11:  EF 55-60%, severe LVH, mod LAE, mild MR, mildly increased PASP    Past Surgical History  Procedure Date  . Cholecystectomy   . Knee surgery   . Eye surgery     cataract surgery OD, corneal implant  . A flutter ablation   . Gallbladder surgery   . Gravid 2 para 2   . Colonoscopy 6/07    2 polyps Dr. Elvina Mattes in [place]  . Cataract surgery 4/07  . Corneal implant 9/08  . Bare metal stent 3/08  . Ablation for a flutter 5/08    dr taylor     History   Social History  . Marital Status: Widowed    Spouse Name: N/A    Number of Children: N/A  . Years of Education: N/A   Occupational History  . Teacher    Social History Main Topics  . Smoking status: Former Smoker    Quit date: 12/27/1968  . Smokeless tobacco: Never Used   Comment: quit 1997  . Alcohol Use: No  . Drug  Use: No  . Sexually Active: Not on file   Other Topics Concern  . Not on file   Social History Narrative   Teacher. Widowed. Rarely drinks cafeine.     Current Outpatient Prescriptions on File Prior to Visit  Medication Sig Dispense Refill  . Calcium Carbonate-Vitamin D (CALCIUM 600 + D PO) Take by mouth daily.        . carvedilol (COREG) 25 MG tablet Take 2 tablets (50 mg total) by mouth 2 (two) times daily with a meal.  120 tablet  6  . Cholecalciferol (VITAMIN D3) 1000 UNITS CAPS Take by mouth daily.        . CRESTOR 20 MG tablet TAKE 1 TABLET EVERY DAY  90 tablet  2  . dabigatran (PRADAXA) 150 MG CAPS Take 1 capsule (150 mg total) by mouth every 12 (twelve) hours.  180 capsule  3  . digoxin (LANOXIN) 0.125 MG tablet Take 1 tablet (125 mcg total) by mouth daily.  30 tablet  11  .  ferrous sulfate 325 (65 FE) MG tablet Take 325 mg by mouth daily with breakfast.        . folic acid (FOLVITE) 400 MCG tablet Take 400 mcg by mouth daily.        Marland Kitchen FREESTYLE LITE test strip Use as instructed  100 each  1  . furosemide (LASIX) 40 MG tablet Take 1 tablet (40 mg total) by mouth daily.  90 tablet  1  . insulin glargine (LANTUS SOLOSTAR) 100 UNIT/ML injection Inject 24 Units into the skin daily.  3 mL  5  . insulin glargine (LANTUS) 100 UNIT/ML injection Inject 24 Units into the skin at bedtime.       . Insulin Pen Needle (B-D ULTRAFINE III SHORT PEN) 31G X 8 MM MISC As directed.  100 each  1  . Lancets (FREESTYLE) lancets Check Blood sugar daily as directed  100 each  3  . metFORMIN (GLUCOPHAGE-XR) 500 MG 24 hr tablet 1 tablet in am, 2 tablets in evening.  180 tablet  0  . nitroGLYCERIN (NITROSTAT) 0.4 MG SL tablet Place 0.4 mg under the tongue every 5 (five) minutes as needed.        . OMEGA 3 1000 MG CAPS Take by mouth.        . potassium chloride SA (KLOR-CON M20) 20 MEQ tablet Take 1 tablet (20 mEq total) by mouth daily.  30 tablet  6  . tiotropium (SPIRIVA HANDIHALER) 18 MCG inhalation  capsule Place 18 mcg into inhaler and inhale daily.        . vitamin B-12 (CYANOCOBALAMIN) 1000 MCG tablet Take 1,000 mcg by mouth daily.          Allergies  Allergen Reactions  . Amlodipine Besylate     REACTION: tingling in lips & gum edema  . Lobster (Shellfish Allergy)     angioedema  . Penicillins     REACTION: rash  . Valsartan     REACTION: angioedema  . Codeine     Mental status changes  . Tramadol Hcl     REACTION: nausea    Family History  Problem Relation Age of Onset  . Diabetes Mother   . Hypertension Mother   . Transient ischemic attack Mother   . Heart attack Father 64  . Arthritis Father   . Breast cancer Maternal Aunt   . Arthritis Maternal Aunt   . Arthritis Other   . Hypertension Other   . Diabetes Other     1st degree relative  no goiter or other thyroid problem  BP 148/68  Pulse 73  Temp(Src) 97.4 F (36.3 C) (Oral)  Ht 5\' 3"  (1.6 m)  Wt 162 lb 8 oz (73.71 kg)  BMI 28.79 kg/m2  SpO2 95%    Review of Systems denies weight loss, headache, hoarseness, double vision, palpitations, sob, diarrhea, polyuria, myalgias, excessive diaphoresis, numbness, tremor, anxiety, hypoglycemia, bruising, rhinorrhea.      Objective:   Physical Exam VS: see vs page GEN: no distress HEAD: head: no deformity eyes: no periorbital swelling, no proptosis external nose and ears are normal mouth: no lesion seen NECK: supple, thyroid is slightly enlarged, with multinodular surface, right > left CHEST WALL: no deformity LUNGS:  Clear to auscultation CV: reg rate and rhythm, no murmur ABD: abdomen is soft, nontender.  no hepatosplenomegaly.  not distended.  no hernia. MUSCULOSKELETAL: muscle bulk and strength are grossly normal.  no obvious joint swelling.  gait is normal and steady EXTEMITIES: no deformity.  no edema PULSES: dorsalis pedis intact bilat.  no carotid bruit NEURO:  cn 2-12 grossly intact.   readily moves all 4's.  sensation is intact to touch on the  feet SKIN:  Normal texture and temperature.  No rash or suspicious lesion is visible.   NODES:  None palpable at the neck PSYCH: alert, oriented x3.  Does not appear anxious nor depressed.   Lab Results  Component Value Date   TSH 0.07* 09/06/2011   (i also reviewed thyroid scan)    Assessment & Plan:  Multinodular goiter, which is usually hereditary Hyperthyroidism.  The relatively sudden onset is uncharacteristic of that due to multinodular goiter.  However, the amiodarone may account for this. Low thyroid iodine uptake.  Again, this could be accounted for by the amiodarone, even 1 year after its discontinuation. Easy bruising, prob not related to dm.   Atrial fibrillation.  She has a severe risk of recurrence, so she needs prompt normalization of tft

## 2011-10-15 NOTE — Telephone Encounter (Signed)
Pt aware samples out front

## 2011-10-18 ENCOUNTER — Ambulatory Visit
Admission: RE | Admit: 2011-10-18 | Discharge: 2011-10-18 | Disposition: A | Discharge status: Home / Self Care | Payer: Medicare Other | Source: Ambulatory Visit | Attending: Endocrinology | Admitting: Endocrinology

## 2011-10-18 DIAGNOSIS — E042 Nontoxic multinodular goiter: Secondary | ICD-10-CM

## 2011-10-27 ENCOUNTER — Other Ambulatory Visit: Payer: Self-pay | Admitting: Internal Medicine

## 2011-10-27 NOTE — Telephone Encounter (Signed)
Done

## 2011-11-04 ENCOUNTER — Other Ambulatory Visit (INDEPENDENT_AMBULATORY_CARE_PROVIDER_SITE_OTHER): Payer: Medicare Other

## 2011-11-04 ENCOUNTER — Encounter: Payer: Self-pay | Admitting: Endocrinology

## 2011-11-04 ENCOUNTER — Ambulatory Visit (INDEPENDENT_AMBULATORY_CARE_PROVIDER_SITE_OTHER): Payer: Medicare Other | Admitting: Endocrinology

## 2011-11-04 VITALS — BP 140/62 | HR 65 | Temp 97.9°F | Ht 63.0 in | Wt 160.0 lb

## 2011-11-04 DIAGNOSIS — E0590 Thyrotoxicosis, unspecified without thyrotoxic crisis or storm: Secondary | ICD-10-CM

## 2011-11-04 NOTE — Progress Notes (Signed)
Subjective:    Patient ID: [name], female    DOB: [birth year], 77 y.o.   MRN: [id number]  HPI Pt returns for f/u of hyperthyroidism, due to multinodular goiter.  She can't due i-131 rx, due to atrial fibrillation.  She feels much better on the tapazole.   Past Medical History  Diagnosis Date  . Hypopotassemia   . Anemia     iron defficiency  . Chest pain   . Fatigue   . Dyspnea     shortness of breath  . Type II or unspecified type diabetes mellitus without mention of complication, uncontrolled   . Muscle pain   . Degenerative joint disease   . Atrial fibrillation or flutter     s/p RFCA 7/08;   s/p DCCV in past;   previously on amiodarone;  amio stopped due to lung toxicity  . CAD (coronary artery disease)     s/p NSTEMI tx with BMS to OM1 3/08;  cath 3/08: pOM 99% tx with PCI, pLAD 20%, ? mod stenosis at the AM  . HLD (hyperlipidemia)   . HTN (hypertension)     essential nos  . Dystrophy, corneal stromal   . Chronic diastolic heart failure     echo 11/11:  EF 55-60%, severe LVH, mod LAE, mild MR, mildly increased PASP    Past Surgical History  Procedure Date  . Cholecystectomy   . Knee surgery   . Eye surgery     cataract surgery OD, corneal implant  . A flutter ablation   . Gallbladder surgery   . Gravid 2 para 2   . Colonoscopy 6/07    2 polyps Dr. Elvina Mattes in [place]  . Cataract surgery 4/07  . Corneal implant 9/08  . Bare metal stent 3/08  . Ablation for a flutter 5/08    dr taylor     History   Social History  . Marital Status: Widowed    Spouse Name: N/A    Number of Children: N/A  . Years of Education: N/A   Occupational History  . Teacher    Social History Main Topics  . Smoking status: Former Smoker    Quit date: 12/27/1968  . Smokeless tobacco: Never Used   Comment: quit 1997  . Alcohol Use: No  . Drug Use: No  . Sexually Active: Not on file   Other Topics Concern  . Not on file   Social History Narrative   Teacher. Widowed.  Rarely drinks cafeine.     Current Outpatient Prescriptions on File Prior to Visit  Medication Sig Dispense Refill  . Calcium Carbonate-Vitamin D (CALCIUM 600 + D PO) Take by mouth daily.        . carvedilol (COREG) 25 MG tablet Take 2 tablets (50 mg total) by mouth 2 (two) times daily with a meal.  120 tablet  6  . Cholecalciferol (VITAMIN D3) 1000 UNITS CAPS Take by mouth daily.        . CRESTOR 20 MG tablet TAKE 1 TABLET EVERY DAY  90 tablet  2  . dabigatran (PRADAXA) 150 MG CAPS Take 1 capsule (150 mg total) by mouth every 12 (twelve) hours.  180 capsule  3  . digoxin (LANOXIN) 0.125 MG tablet Take 1 tablet (125 mcg total) by mouth daily.  30 tablet  11  . ferrous sulfate 325 (65 FE) MG tablet Take 325 mg by mouth daily with breakfast.        . folic acid (FOLVITE) 400 MCG  tablet Take 400 mcg by mouth daily.        Marland Kitchen FREESTYLE LITE test strip Use as instructed  100 each  1  . furosemide (LASIX) 40 MG tablet Take 1 tablet (40 mg total) by mouth daily.  90 tablet  1  . insulin glargine (LANTUS SOLOSTAR) 100 UNIT/ML injection Inject 24 Units into the skin daily.  3 mL  5  . Insulin Pen Needle (B-D ULTRAFINE III SHORT PEN) 31G X 8 MM MISC As directed.  100 each  1  . Lancets (FREESTYLE) lancets Check Blood sugar daily as directed  100 each  3  . metFORMIN (GLUCOPHAGE-XR) 500 MG 24 hr tablet TAKE 1 TABLET IN THE MORNING AND 2 TABLETS IN THE EVENING  180 tablet  0  . nitroGLYCERIN (NITROSTAT) 0.4 MG SL tablet Place 0.4 mg under the tongue every 5 (five) minutes as needed.        . OMEGA 3 1000 MG CAPS Take by mouth.        . potassium chloride SA (KLOR-CON M20) 20 MEQ tablet Take 1 tablet (20 mEq total) by mouth daily.  30 tablet  6  . tiotropium (SPIRIVA HANDIHALER) 18 MCG inhalation capsule Place 18 mcg into inhaler and inhale daily.        . vitamin B-12 (CYANOCOBALAMIN) 1000 MCG tablet Take 1,000 mcg by mouth daily.          Allergies  Allergen Reactions  . Amlodipine Besylate      REACTION: tingling in lips & gum edema  . Lobster (Shellfish Allergy)     angioedema  . Penicillins     REACTION: rash  . Valsartan     REACTION: angioedema  . Codeine     Mental status changes  . Tramadol Hcl     REACTION: nausea    Family History  Problem Relation Age of Onset  . Diabetes Mother   . Hypertension Mother   . Transient ischemic attack Mother   . Heart attack Father 41  . Arthritis Father   . Breast cancer Maternal Aunt   . Arthritis Maternal Aunt   . Arthritis Other   . Hypertension Other   . Diabetes Other     1st degree relative    BP 140/62  Pulse 65  Temp(Src) 97.9 F (36.6 C) (Oral)  Ht 5\' 3"  (1.6 m)  Wt 160 lb (72.576 kg)  BMI 28.34 kg/m2  SpO2 94%    Review of Systems Denies fever.      Objective:   Physical Exam VITAL SIGNS:  See vs page GENERAL: no distress Neck: i do not appreciate a goiter.   Lab Results  Component Value Date   TSH 1.19 11/04/2011      Assessment & Plan:  Hyperthyroidism, much better

## 2011-11-04 NOTE — Patient Instructions (Addendum)
blood tests are being requested for you today.  please call [phone] to hear your test results.  You will be prompted to enter the 9-digit "MRN" number that appears at the top left of this page, followed by #.  Then you will hear the message. if ever you have fever while taking methimazole, stop it and call us, because of the risk of a rare side-effect Please come back for a follow-up appointment in approx 2 monthss.  Please make an appointment.   (update: i left message on phone-tree:  Reduce tapazole to 10/d)

## 2011-11-05 LAB — TSH: TSH: 1.19 u[IU]/mL (ref 0.35–5.50)

## 2011-11-05 LAB — T4, FREE: Free T4: 0.84 ng/dL (ref 0.60–1.60)

## 2011-11-11 ENCOUNTER — Telehealth: Payer: Self-pay | Admitting: Gastroenterology

## 2011-11-11 NOTE — Telephone Encounter (Signed)
Received 5 pages from Dr. Elvina Mattes; forwarded to Dr. Christella Hartigan for review. 11/1/-ar

## 2011-11-16 ENCOUNTER — Ambulatory Visit (INDEPENDENT_AMBULATORY_CARE_PROVIDER_SITE_OTHER): Payer: Medicare Other | Admitting: Internal Medicine

## 2011-11-16 DIAGNOSIS — K921 Melena: Secondary | ICD-10-CM

## 2011-11-16 DIAGNOSIS — Z23 Encounter for immunization: Secondary | ICD-10-CM

## 2011-11-16 DIAGNOSIS — D509 Iron deficiency anemia, unspecified: Secondary | ICD-10-CM

## 2011-11-16 DIAGNOSIS — E0590 Thyrotoxicosis, unspecified without thyrotoxic crisis or storm: Secondary | ICD-10-CM

## 2011-11-16 DIAGNOSIS — D510 Vitamin B12 deficiency anemia due to intrinsic factor deficiency: Secondary | ICD-10-CM

## 2011-11-16 NOTE — Patient Instructions (Addendum)
Stop Pradaxa until further notice; take Nexium before breakfast and evening meal. To ER if pain progresses or is associaled with Warning Signs as discussed. Please complete stool cards

## 2011-11-16 NOTE — Progress Notes (Signed)
  Subjective:    Patient ID: [name], female    DOB: 1931/07/28, 77 y.o.   MRN: [id number]  HPI  BLACK STOOL &ABDOMINAL PAIN: Onset: 11/14 as melena/ abd pain 11/18 Location: suprapubic    Radiation: no  Severity: up to 4 Quality: dull  Duration: constant "24/7"  Better with: no treatment; (Note: not on Pepto Bismol)  Worse with: no factors Symptoms Nausea/Vomiting: no  Diarrhea: soft - liquid stool Constipation: no  BRBPR: no  Hematemesis: no  Anorexia: yes, some  Fever/Chills: no  Dysuria/ hematuria/ pyuria: no  Wt loss: no  EtOH use: no  NSAIDs/ASA: no  Abnormal bleeding: no epistaxis, hemoptysis  Past Surgeries: Colonoscopy 2007 : polyps , 1 pre cancerous. F/U pending next week with Dr Christella Hartigan. She is on Pradaxa 150 mg twice a day  Past medical history includes both iron deficiency anemia and pernicious anemia. She has been on methimazole for hyperthyroidism. The dose was recently decreased to once daily.       Review of Systems     Objective:   Physical Exam Gen.: Healthy and well-nourished in appearance. Alert, appropriate and cooperative throughout exam. Eyes: No corneal or conjunctival inflammation noted. No icterus Mouth: Oral mucosa and oropharynx reveal no lesions or exudates.No erythema of pharynx. Upper partial ; teeth in good repair. Neck: No deformities, masses, or tenderness noted. Thyroid finely irregular. Lungs: Normal respiratory effort; chest expands symmetrically. Lungs are clear to auscultation without rales, wheezes, or increased work of breathing. Heart: Normal rate and rhythm. Normal S1 and S2. No gallop, click, or rub. No  murmur. Abdomen: Bowel sounds normal; abdomen soft and nontender. No masses, organomegaly or hernias noted.                                                                               Musculoskeletal/extremities:  No clubbing, cyanosis, edema, or deformity noted. Vascular: Carotid, radial artery, dorsalis pedis and   posterior tibial pulses are full and equal. No bruits present. Neurologic: Alert and oriented x3. Deep tendon reflexes symmetrical and normal.          Skin: Intact without suspicious lesions or rashes. Lymph: No cervical, axillary lymphadenopathy present. Psych: Mood and affect are normal. Normally interactive                                                                                         Assessment & Plan:  #1 dark stool; in the context of a past history of precancerous polyp and ongoing Pradaxa  therapy  #2 methimazole therapy for hyperthyroidism  #3 past medical history of anemia both iron deficiency and pernicious.  Plan: The benefits of stopping the Pradaxa would appear to outweigh the risk. Stool cards will be collected. It is noted that she's not been taking Pepto-Bismol. CBC will be checked.

## 2011-11-16 NOTE — Progress Notes (Signed)
Addended by: Edgardo Roys on: 11/16/2011 04:55 PM   Modules accepted: Orders

## 2011-11-17 ENCOUNTER — Telehealth: Payer: Self-pay

## 2011-11-17 LAB — CBC WITH DIFFERENTIAL/PLATELET
Basophils Absolute: 0 10*3/uL (ref 0.0–0.1)
Eosinophils Absolute: 0.3 10*3/uL (ref 0.0–0.7)
Lymphocytes Relative: 18.9 % (ref 12.0–46.0)
Lymphs Abs: 0.9 10*3/uL (ref 0.7–4.0)
MCHC: 32.3 g/dL (ref 30.0–36.0)
Monocytes Relative: 7.2 % (ref 3.0–12.0)
Platelets: 188 10*3/uL (ref 150.0–400.0)
RDW: 13.7 % (ref 11.5–14.6)

## 2011-11-17 NOTE — Telephone Encounter (Signed)
Message copied by Edgardo Roys on Wed Nov 17, 2011  5:18 PM ------      Message from: Pecola Lawless      Created: Wed Nov 17, 2011  5:17 PM       Complete blood count is totally normal; no elevation in white count, anemia, or abnormal cells are present.Your eosinophil  or allergic cell count is mildly elevated. Over-the-counter loratadine can be used as needed for any allergic symptoms. [employer]

## 2011-11-17 NOTE — Telephone Encounter (Signed)
Spoke with patient, patient ok'd all information. Per Dr.Hopper ok to stop iron (removed from med list)

## 2011-11-23 ENCOUNTER — Encounter: Payer: Self-pay | Admitting: Gastroenterology

## 2011-11-23 ENCOUNTER — Ambulatory Visit (INDEPENDENT_AMBULATORY_CARE_PROVIDER_SITE_OTHER): Payer: Medicare Other | Admitting: Gastroenterology

## 2011-11-23 VITALS — BP 140/72 | HR 68 | Ht 64.0 in | Wt 159.6 lb

## 2011-11-23 DIAGNOSIS — K922 Gastrointestinal hemorrhage, unspecified: Secondary | ICD-10-CM

## 2011-11-23 DIAGNOSIS — Z86010 Personal history of colonic polyps: Secondary | ICD-10-CM

## 2011-11-23 MED ORDER — PEG-KCL-NACL-NASULF-NA ASC-C 100 G PO SOLR: 1.0000 | ORAL | Status: DC

## 2011-11-23 NOTE — Patient Instructions (Signed)
You will be set up for a colonoscopy. If the colonoscopy does not explain your recent black stools, you will be set up for an upper endoscopy at the same time. A copy of this information will be made available to Dr. Alwyn Ren.

## 2011-11-23 NOTE — Progress Notes (Signed)
Review of pertinent gastrointestinal problems: 1.  history of adenomatous polyps;  colonoscopy 2007 in [facility] by Dr. Loman Chroman.     HPI: This is a very pleasant 77 year old woman whom I last saw about 2 years ago  Had black stools every day for about a week.  Told her PCP about it who stopped her pradaxa and iron supplement one week ago and stools went back to normal within 2-3 days.  She takes ibuprofen 1-2 pills 2 times week.  She had a dull ache in lower abdomen around the time of the dark stools.  That dull ache has subsided as well  Cbc earlier this month was normal.   Past Medical History  Diagnosis Date  . Hypopotassemia   . Anemia     iron defficiency  . Chest pain   . Fatigue   . Dyspnea     shortness of breath  . Type II or unspecified type diabetes mellitus without mention of complication, uncontrolled   . Muscle pain   . Degenerative joint disease   . Atrial fibrillation or flutter     s/p RFCA 7/08;   s/p DCCV in past;   previously on amiodarone;  amio stopped due to lung toxicity  . CAD (coronary artery disease)     s/p NSTEMI tx with BMS to OM1 3/08;  cath 3/08: pOM 99% tx with PCI, pLAD 20%, ? mod stenosis at the AM  . HLD (hyperlipidemia)   . HTN (hypertension)     essential nos  . Dystrophy, corneal stromal   . Chronic diastolic heart failure     echo 11/11:  EF 55-60%, severe LVH, mod LAE, mild MR, mildly increased PASP    Past Surgical History  Procedure Date  . Cholecystectomy   . Knee surgery   . Eye surgery     cataract surgery OD, corneal implant  . A flutter ablation   . Gallbladder surgery   . Gravid 2 para 2   . Colonoscopy 6/07    2 polyps Dr. Elvina Mattes in [place]  . Cataract surgery 4/07  . Corneal implant 9/08  . Bare metal stent 3/08  . Ablation for a flutter 5/08    dr taylor     Current Outpatient Prescriptions  Medication Sig Dispense Refill  . Calcium Carbonate-Vitamin D (CALCIUM 600 + D PO) Take by mouth daily.         . carvedilol (COREG) 25 MG tablet Take 2 tablets (50 mg total) by mouth 2 (two) times daily with a meal.  120 tablet  6  . Cholecalciferol (VITAMIN D3) 1000 UNITS CAPS Take by mouth daily.        . CRESTOR 20 MG tablet TAKE 1 TABLET EVERY DAY  90 tablet  2  . dabigatran (PRADAXA) 150 MG CAPS Take 1 capsule (150 mg total) by mouth every 12 (twelve) hours.  180 capsule  3  . digoxin (LANOXIN) 0.125 MG tablet Take 1 tablet (125 mcg total) by mouth daily.  30 tablet  11  . folic acid (FOLVITE) 400 MCG tablet Take 400 mcg by mouth daily.        Marland Kitchen FREESTYLE LITE test strip Use as instructed  100 each  1  . furosemide (LASIX) 40 MG tablet Take 1 tablet (40 mg total) by mouth daily.  90 tablet  1  . insulin glargine (LANTUS) 100 UNIT/ML injection Inject 20 Units into the skin daily.        Marland Kitchen  Insulin Pen Needle (B-D ULTRAFINE III SHORT PEN) 31G X 8 MM MISC As directed.  100 each  1  . Lancets (FREESTYLE) lancets Check Blood sugar daily as directed  100 each  3  . metFORMIN (GLUCOPHAGE-XR) 500 MG 24 hr tablet TAKE 1 TABLET IN THE MORNING AND 2 TABLETS IN THE EVENING  180 tablet  0  . methimazole (TAPAZOLE) 10 MG tablet Take 10 mg by mouth daily.        . nitroGLYCERIN (NITROSTAT) 0.4 MG SL tablet Place 0.4 mg under the tongue every 5 (five) minutes as needed.        . OMEGA 3 1000 MG CAPS Take by mouth.        . potassium chloride SA (KLOR-CON M20) 20 MEQ tablet Take 1 tablet (20 mEq total) by mouth daily.  30 tablet  6  . tiotropium (SPIRIVA HANDIHALER) 18 MCG inhalation capsule Place 18 mcg into inhaler and inhale daily.        . vitamin B-12 (CYANOCOBALAMIN) 1000 MCG tablet Take 1,000 mcg by mouth daily.          Allergies as of 11/23/2011 - Review Complete 11/23/2011  Allergen Reaction Noted  . Amlodipine besylate  04/28/2010  . Lobster (shellfish allergy)  07/21/2011  . Penicillins  04/28/2010  . Valsartan  01/29/2008  . Codeine  07/21/2011  . Tramadol hcl  11/22/2008    Family History    Problem Relation Age of Onset  . Diabetes Mother   . Hypertension Mother   . Transient ischemic attack Mother   . Heart attack Father 88  . Arthritis Father   . Breast cancer Maternal Aunt   . Arthritis Maternal Aunt   . Arthritis Other   . Hypertension Other   . Diabetes Other     1st degree relative    History   Social History  . Marital Status: Widowed    Spouse Name: N/A    Number of Children: N/A  . Years of Education: N/A   Occupational History  . Teacher    Social History Main Topics  . Smoking status: Former Smoker    Quit date: 12/27/1968  . Smokeless tobacco: Never Used   Comment: quit 1997  . Alcohol Use: No  . Drug Use: No  . Sexually Active: Not on file   Other Topics Concern  . Not on file   Social History Narrative   Teacher. Widowed. Rarely drinks cafeine.       Physical Exam: BP 140/72  Pulse 68  Ht 5\' 4"  (1.626 m)  Wt 159 lb 9.6 oz (72.394 kg)  BMI 27.40 kg/m2 Constitutional: generally well-appearing Psychiatric: alert and oriented x3 Abdomen: soft, nontender, nondistended, no obvious ascites, no peritoneal signs, normal bowel sounds     Assessment and plan: 77 y.o. female with personal history of adenomatous polyps, recent black stools, not anemic  She is due for polyp surveillance colonoscopy around now and we will proceed with that test in the next several days. She stopped Pradaxa and iron last week on the advice of her primary care physician and she will continue to hold that medicine. If I see nothing in her colon I would like to proceed with EGD at the same time given her recent black stools.

## 2011-11-30 ENCOUNTER — Ambulatory Visit (AMBULATORY_SURGERY_CENTER): Payer: Medicare Other | Admitting: Gastroenterology

## 2011-11-30 ENCOUNTER — Encounter: Payer: Self-pay | Admitting: Gastroenterology

## 2011-11-30 DIAGNOSIS — K922 Gastrointestinal hemorrhage, unspecified: Secondary | ICD-10-CM

## 2011-11-30 DIAGNOSIS — D126 Benign neoplasm of colon, unspecified: Secondary | ICD-10-CM

## 2011-11-30 DIAGNOSIS — R195 Other fecal abnormalities: Secondary | ICD-10-CM

## 2011-11-30 DIAGNOSIS — Z86010 Personal history of colonic polyps: Secondary | ICD-10-CM

## 2011-11-30 DIAGNOSIS — Z1211 Encounter for screening for malignant neoplasm of colon: Secondary | ICD-10-CM

## 2011-11-30 LAB — GLUCOSE, CAPILLARY: Glucose-Capillary: 134 mg/dL — ABNORMAL HIGH (ref 70–99)

## 2011-11-30 MED ORDER — SODIUM CHLORIDE 0.9 % IV SOLN: 500.0000 mL | INTRAVENOUS | Status: DC

## 2011-11-30 NOTE — Patient Instructions (Addendum)
You can restart your pradaxa today. Discharge instructions given with verbal understanding. Handouts on polyps, and a hiatal hernia. Resume previous medications.

## 2011-11-30 NOTE — Progress Notes (Signed)
Patient did not experience any of the following events: a burn prior to discharge; a fall within the facility; wrong site/side/patient/procedure/implant event; or a hospital transfer or hospital admission upon discharge from the facility. (G8907) Patient did not have preoperative order for IV antibiotic SSI prophylaxis. (G8918)  

## 2011-12-01 ENCOUNTER — Telehealth: Payer: Self-pay | Admitting: *Deleted

## 2011-12-01 ENCOUNTER — Telehealth: Payer: Self-pay | Admitting: Gastroenterology

## 2011-12-01 NOTE — Telephone Encounter (Signed)
Returned pts phone call.  Will try to locate some info on hiatal hernia to mail to her.

## 2011-12-01 NOTE — Telephone Encounter (Signed)
Follow up Call- Patient questions:  Do you have a fever, pain , or abdominal swelling? no Pain Score  0 *  Have you tolerated food without any problems? yes  Have you been able to return to your normal activities? yes  Do you have any questions about your discharge instructions: Diet   no Medications  yes Follow up visit  yes  Do you have questions or concerns about your Care? no  Actions: * If pain score is 4 or above: No action needed, pain <4.  Pt had questions about GERD and hiatal hernia.  Also asked about resuming her pradaxa.  Advised her to call Dr. Lubertha Basque office to see what he recommends.

## 2011-12-06 ENCOUNTER — Telehealth: Payer: Self-pay | Admitting: Internal Medicine

## 2011-12-07 ENCOUNTER — Ambulatory Visit (INDEPENDENT_AMBULATORY_CARE_PROVIDER_SITE_OTHER): Payer: Medicare Other | Admitting: Internal Medicine

## 2011-12-07 ENCOUNTER — Ambulatory Visit: Payer: Medicare Other | Admitting: Internal Medicine

## 2011-12-07 ENCOUNTER — Encounter: Payer: Self-pay | Admitting: Internal Medicine

## 2011-12-07 DIAGNOSIS — I4891 Unspecified atrial fibrillation: Secondary | ICD-10-CM

## 2011-12-07 DIAGNOSIS — I10 Essential (primary) hypertension: Secondary | ICD-10-CM

## 2011-12-07 MED ORDER — DABIGATRAN ETEXILATE MESYLATE 75 MG PO CAPS: 75.0000 mg | ORAL_CAPSULE | Freq: Two times a day (BID) | ORAL | Status: DC

## 2011-12-07 NOTE — Telephone Encounter (Signed)
no

## 2011-12-07 NOTE — Telephone Encounter (Signed)
Dr.Hopper please advise 

## 2011-12-07 NOTE — Progress Notes (Signed)
HPI [name] returns today for followup. She is a 77 year old woman with a history of atrial flutter status post ablation, atrial fibrillation which was initially controlled very nicely on amiodarone. She unfortunately had to stop amiodarone because of lung toxicity. Initially the patient's atrial arrhythmias were very difficult to rate control. She is now in chronic atrial fibrillation but has been very well controlled. Most recently, the patient developed black stools. Her anticoagulation was discontinued. She was seen in her evaluation and underwent endoscopy and colonoscopy. This demonstrated a hiatal hernia and one polyp which was removed and sent for biopsy and not malignant. She has had no additional black stools. The patient does have chronic mixed heart failure. Her symptoms are class II. She denies syncope. She no longer feels palpitations despite being in atrial fibrillation. In recent months, her ventricular response has been much easier to control Allergies  Allergen Reactions  . Amlodipine Besylate     REACTION: tingling in lips & gum edema  . Lobster (Shellfish Allergy)     angioedema  . Penicillins     REACTION: rash  . Valsartan     REACTION: angioedema  . Codeine     Mental status changes  . Tramadol Hcl     REACTION: nausea     Current Outpatient Prescriptions  Medication Sig Dispense Refill  . Calcium Carbonate-Vitamin D (CALCIUM 600 + D PO) Take by mouth daily.        . carvedilol (COREG) 25 MG tablet Take 2 tablets (50 mg total) by mouth 2 (two) times daily with a meal.  120 tablet  6  . Cholecalciferol (VITAMIN D3) 1000 UNITS CAPS Take by mouth daily.        . CRESTOR 20 MG tablet TAKE 1 TABLET EVERY DAY  90 tablet  2  . dabigatran (PRADAXA) 150 MG CAPS Take 1 capsule (150 mg total) by mouth every 12 (twelve) hours.  180 capsule  3  . digoxin (LANOXIN) 0.125 MG tablet Take 1 tablet (125 mcg total) by mouth daily.  30 tablet  11  . folic acid (FOLVITE) 400 MCG  tablet Take 400 mcg by mouth daily.        Marland Kitchen FREESTYLE LITE test strip Use as instructed  100 each  1  . furosemide (LASIX) 40 MG tablet Take 1 tablet (40 mg total) by mouth daily.  90 tablet  1  . insulin glargine (LANTUS) 100 UNIT/ML injection Inject 20 Units into the skin daily.        . Insulin Pen Needle (B-D ULTRAFINE III SHORT PEN) 31G X 8 MM MISC As directed.  100 each  1  . Lancets (FREESTYLE) lancets Check Blood sugar daily as directed  100 each  3  . metFORMIN (GLUCOPHAGE-XR) 500 MG 24 hr tablet TAKE 1 TABLET IN THE MORNING AND 2 TABLETS IN THE EVENING  180 tablet  0  . methimazole (TAPAZOLE) 10 MG tablet Take 10 mg by mouth 2 (two) times daily.       . nitroGLYCERIN (NITROSTAT) 0.4 MG SL tablet Place 0.4 mg under the tongue every 5 (five) minutes as needed.        . OMEGA 3 1000 MG CAPS Take by mouth.        . potassium chloride SA (KLOR-CON M20) 20 MEQ tablet Take 1 tablet (20 mEq total) by mouth daily.  30 tablet  6  . tiotropium (SPIRIVA HANDIHALER) 18 MCG inhalation capsule Place 18 mcg into inhaler and inhale daily.        Marland Kitchen  vitamin B-12 (CYANOCOBALAMIN) 1000 MCG tablet Take 1,000 mcg by mouth daily.        . dabigatran (PRADAXA) 75 MG CAPS Take 1 capsule (75 mg total) by mouth every 12 (twelve) hours.  60 capsule  6     Past Medical History  Diagnosis Date  . Hypopotassemia   . Anemia     iron defficiency  . Chest pain   . Fatigue   . Dyspnea     shortness of breath  . Type II or unspecified type diabetes mellitus without mention of complication, uncontrolled   . Muscle pain   . Degenerative joint disease   . Atrial fibrillation or flutter     s/p RFCA 7/08;   s/p DCCV in past;   previously on amiodarone;  amio stopped due to lung toxicity  . CAD (coronary artery disease)     s/p NSTEMI tx with BMS to OM1 3/08;  cath 3/08: pOM 99% tx with PCI, pLAD 20%, ? mod stenosis at the AM  . HLD (hyperlipidemia)   . HTN (hypertension)     essential nos  . Dystrophy, corneal  stromal   . Chronic diastolic heart failure     echo 11/11:  EF 55-60%, severe LVH, mod LAE, mild MR, mildly increased PASP  . Allergy   . Cataract   . CHF (congestive heart failure)   . Diabetes mellitus   . Myocardial infarction   . Osteoporosis   . Thyroid disease     ROS:   All systems reviewed and negative except as noted in the HPI.   Past Surgical History  Procedure Date  . Cholecystectomy   . Knee surgery   . Eye surgery     cataract surgery OD, corneal implant  . A flutter ablation   . Gallbladder surgery   . Gravid 2 para 2   . Colonoscopy 6/07    2 polyps Dr. Elvina Mattes in [place]  . Cataract surgery 4/07  . Corneal implant 9/08  . Bare metal stent 3/08  . Ablation for a flutter 5/08    dr taylor      Family History  Problem Relation Age of Onset  . Diabetes Mother   . Hypertension Mother   . Transient ischemic attack Mother   . Heart attack Father 23  . Arthritis Father   . Breast cancer Maternal Aunt   . Arthritis Maternal Aunt   . Arthritis Other   . Hypertension Other   . Diabetes Other     1st degree relative     History   Social History  . Marital Status: Widowed    Spouse Name: N/A    Number of Children: N/A  . Years of Education: N/A   Occupational History  . Teacher    Social History Main Topics  . Smoking status: Former Smoker    Quit date: 12/27/1968  . Smokeless tobacco: Never Used   Comment: quit 1997  . Alcohol Use: No  . Drug Use: No  . Sexually Active: Not on file   Other Topics Concern  . Not on file   Social History Narrative   Teacher. Widowed. Rarely drinks cafeine.      BP 130/62  Pulse 54  Ht 5\' 3"  (1.6 m)  Wt 72.122 kg (159 lb)  BMI 28.17 kg/m2  Physical Exam:  Well appearing 77 year old woman, NAD HEENT: Unremarkable Neck:  No JVD, no thyromegally Lymphatics:  No adenopathy Back:  No CVA tenderness Lungs:  Clear  with no wheezes, rales, or rhonchi. HEART:  IRegular rate rhythm, no murmurs, no rubs,  no clicks Abd:  soft, positive bowel sounds, no organomegally, no rebound, no guarding Ext:  2 plus pulses, no edema, no cyanosis, no clubbing Skin:  No rashes no nodules Neuro:  CN II through XII intact, motor grossly intact  EKG Atrial fibrillation with a controlled ventricular response.  Assess/Plan:

## 2011-12-07 NOTE — Patient Instructions (Signed)
Your physician wants you to follow-up in: 4 months with Dr Court Joy will receive a reminder letter in the mail two months in advance. If you don't receive a letter, please call our office to schedule the follow-up appointment.  Decrease Pradaxa to 75mg  twice daily  Patient will take 150mg  once daily until she finishes up the medications she has on hand

## 2011-12-07 NOTE — Telephone Encounter (Signed)
Spoke with patient's son and informed him of Dr.Hopper's response, ok'd.

## 2011-12-07 NOTE — Assessment & Plan Note (Signed)
Her blood pressure is fairly well controlled. She will continue a low sodium diet and her current medical therapy.

## 2011-12-07 NOTE — Assessment & Plan Note (Signed)
Her ventricular rate is well controlled. I discussed the treatment options with the patient. The risk versus the benefits of returning her anticoagulations have been discussed. I recommend that we try restarting Pradaxa at 75 mg twice daily. If she were to have recurrent bleeding on this lower dose than she would unfortunately be not a candidate for systemic anticoagulation unless a source of her bleeding is found.

## 2011-12-09 ENCOUNTER — Telehealth: Payer: Self-pay

## 2011-12-09 NOTE — Telephone Encounter (Signed)
It looks like she's has had  Too much "doctoring" recently ; followup in 6 months or sooner if needed.

## 2011-12-09 NOTE — Telephone Encounter (Signed)
Message left on voicemail, patient would like to know if she needs to have a follow-up with Dr.Hopper  FYI: Patient seen cardiologist, was restarted on Pradaxa (taking 1/2 the dose she was taking before)  Dr.Hopper please advise ( I reviewed last OV and labs order by you, there was no indication for a follow-up)

## 2011-12-10 NOTE — Telephone Encounter (Signed)
Patient aware of Dr.Hopper's response.  

## 2011-12-18 ENCOUNTER — Other Ambulatory Visit: Payer: Self-pay | Admitting: Internal Medicine

## 2011-12-20 NOTE — Telephone Encounter (Signed)
A1C 250.00 

## 2011-12-31 ENCOUNTER — Ambulatory Visit (INDEPENDENT_AMBULATORY_CARE_PROVIDER_SITE_OTHER): Payer: Medicare Other | Admitting: Endocrinology

## 2011-12-31 ENCOUNTER — Encounter: Payer: Self-pay | Admitting: Endocrinology

## 2011-12-31 ENCOUNTER — Other Ambulatory Visit (INDEPENDENT_AMBULATORY_CARE_PROVIDER_SITE_OTHER): Payer: Medicare Other

## 2011-12-31 VITALS — BP 140/70 | HR 69 | Temp 97.8°F | Ht 63.0 in | Wt 159.8 lb

## 2011-12-31 DIAGNOSIS — E0590 Thyrotoxicosis, unspecified without thyrotoxic crisis or storm: Secondary | ICD-10-CM

## 2011-12-31 NOTE — Patient Instructions (Addendum)
blood tests are being requested for you today.  please call [phone] to hear your test results.  You will be prompted to enter the 9-digit "MRN" number that appears at the top left of this page, followed by #.  Then you will hear the message. if ever you have fever while taking methimazole, stop it and call us, because of the risk of a rare side-effect Please come back for a follow-up appointment in approx 3 months.  Please make an appointment.   (update: i left message on phone-tree:  Stop tapazole.  Recheck tft in 2 weeks).

## 2011-12-31 NOTE — Progress Notes (Signed)
Subjective:    Patient ID: [name], female    DOB: 1931/06/28, 78 y.o.   MRN: [id number]  HPI Pt returns for f/u of hyperthyroidism, due to multinodular goiter. She can't do i-131 rx, due to the need ofr prompt normalization of her TFT (atrial fibrillation). She feels much better on the tapazole, despite the decreased dosage at last ov.   Past Medical History  Diagnosis Date  . Hypopotassemia   . Anemia     iron defficiency  . Chest pain   . Fatigue   . Dyspnea     shortness of breath  . Type II or unspecified type diabetes mellitus without mention of complication, uncontrolled   . Muscle pain   . Degenerative joint disease   . Atrial fibrillation or flutter     s/p RFCA 7/08;   s/p DCCV in past;   previously on amiodarone;  amio stopped due to lung toxicity  . CAD (coronary artery disease)     s/p NSTEMI tx with BMS to OM1 3/08;  cath 3/08: pOM 99% tx with PCI, pLAD 20%, ? mod stenosis at the AM  . HLD (hyperlipidemia)   . HTN (hypertension)     essential nos  . Dystrophy, corneal stromal   . Chronic diastolic heart failure     echo 11/11:  EF 55-60%, severe LVH, mod LAE, mild MR, mildly increased PASP  . Allergy   . Cataract   . CHF (congestive heart failure)   . Diabetes mellitus   . Myocardial infarction   . Osteoporosis   . Thyroid disease     Past Surgical History  Procedure Date  . Cholecystectomy   . Knee surgery   . Eye surgery     cataract surgery OD, corneal implant  . A flutter ablation   . Gallbladder surgery   . Gravid 2 para 2   . Colonoscopy 6/07    2 polyps Dr. Elvina Mattes in [place]  . Cataract surgery 4/07  . Corneal implant 9/08  . Bare metal stent 3/08  . Ablation for a flutter 5/08    dr taylor     History   Social History  . Marital Status: Widowed    Spouse Name: N/A    Number of Children: N/A  . Years of Education: N/A   Occupational History  . Teacher    Social History Main Topics  . Smoking status: Former Smoker    Quit  date: 12/27/1968  . Smokeless tobacco: Never Used   Comment: quit 1997  . Alcohol Use: No  . Drug Use: No  . Sexually Active: Not on file   Other Topics Concern  . Not on file   Social History Narrative   Teacher. Widowed. Rarely drinks cafeine.     Current Outpatient Prescriptions on File Prior to Visit  Medication Sig Dispense Refill  . Calcium Carbonate-Vitamin D (CALCIUM 600 + D PO) Take by mouth daily.        . carvedilol (COREG) 25 MG tablet Take 2 tablets (50 mg total) by mouth 2 (two) times daily with a meal.  120 tablet  6  . Cholecalciferol (VITAMIN D3) 1000 UNITS CAPS Take by mouth daily.        . CRESTOR 20 MG tablet TAKE 1 TABLET EVERY DAY  90 tablet  2  . folic acid (FOLVITE) 400 MCG tablet Take 400 mcg by mouth daily.        Marland Kitchen FREESTYLE LITE test strip Use as  instructed  100 each  1  . furosemide (LASIX) 40 MG tablet Take 1 tablet (40 mg total) by mouth daily.  90 tablet  1  . insulin glargine (LANTUS) 100 UNIT/ML injection Inject 20 Units into the skin daily.        . Insulin Pen Needle (B-D ULTRAFINE III SHORT PEN) 31G X 8 MM MISC As directed.  100 each  1  . Lancets (FREESTYLE) lancets Check Blood sugar daily as directed  100 each  3  . metFORMIN (GLUCOPHAGE-XR) 500 MG 24 hr tablet TAKE 1 TABLET IN THE MORNING AND 2 TABLETS IN THE EVENING  180 tablet  0  . nitroGLYCERIN (NITROSTAT) 0.4 MG SL tablet Place 0.4 mg under the tongue every 5 (five) minutes as needed.        . OMEGA 3 1000 MG CAPS Take by mouth.        . potassium chloride SA (KLOR-CON M20) 20 MEQ tablet Take 1 tablet (20 mEq total) by mouth daily.  30 tablet  6  . tiotropium (SPIRIVA HANDIHALER) 18 MCG inhalation capsule Place 18 mcg into inhaler and inhale daily.        . vitamin B-12 (CYANOCOBALAMIN) 1000 MCG tablet Take 1,000 mcg by mouth daily.        Marland Kitchen DISCONTD: digoxin (LANOXIN) 0.125 MG tablet Take 1 tablet (125 mcg total) by mouth daily.  30 tablet  11  . dabigatran (PRADAXA) 75 MG CAPS Take 1  capsule (75 mg total) by mouth every 12 (twelve) hours.  60 capsule  6    Allergies  Allergen Reactions  . Amlodipine Besylate     REACTION: tingling in lips & gum edema  . Lobster (Shellfish Allergy)     angioedema  . Penicillins     REACTION: rash  . Valsartan     REACTION: angioedema  . Codeine     Mental status changes  . Tramadol Hcl     REACTION: nausea    Family History  Problem Relation Age of Onset  . Diabetes Mother   . Hypertension Mother   . Transient ischemic attack Mother   . Heart attack Father 23  . Arthritis Father   . Breast cancer Maternal Aunt   . Arthritis Maternal Aunt   . Arthritis Other   . Hypertension Other   . Diabetes Other     1st degree relative    BP 140/70  Pulse 69  Temp(Src) 97.8 F (36.6 C) (Oral)  Ht 5\' 3"  (1.6 m)  Wt 159 lb 12.8 oz (72.485 kg)  BMI 28.31 kg/m2  SpO2 97%   Review of Systems Denies fever.    Objective:   Physical Exam VITAL SIGNS:  See vs page GENERAL: no distress NECK: There is no palpable thyroid enlargement.  No thyroid nodule is palpable.  No palpable lymphadenopathy at the anterior neck.  Lab Results  Component Value Date   TSH 43.97* 12/31/2011      Assessment & Plan:  Hyperthyroidism, overcontrolled

## 2012-01-03 ENCOUNTER — Other Ambulatory Visit (INDEPENDENT_AMBULATORY_CARE_PROVIDER_SITE_OTHER): Payer: Medicare Other

## 2012-01-03 DIAGNOSIS — Z1211 Encounter for screening for malignant neoplasm of colon: Secondary | ICD-10-CM

## 2012-01-03 LAB — HEMOCCULT GUIAC POC 1CARD (OFFICE)
Card #2 Date: NEGATIVE
Card #3 Date: NEGATIVE

## 2012-01-16 ENCOUNTER — Other Ambulatory Visit: Payer: Self-pay | Admitting: Endocrinology

## 2012-01-18 ENCOUNTER — Encounter: Payer: Self-pay | Admitting: Endocrinology

## 2012-01-18 ENCOUNTER — Other Ambulatory Visit (INDEPENDENT_AMBULATORY_CARE_PROVIDER_SITE_OTHER): Payer: Medicare Other

## 2012-01-18 ENCOUNTER — Ambulatory Visit (INDEPENDENT_AMBULATORY_CARE_PROVIDER_SITE_OTHER): Payer: Medicare Other | Admitting: Endocrinology

## 2012-01-18 DIAGNOSIS — E0590 Thyrotoxicosis, unspecified without thyrotoxic crisis or storm: Secondary | ICD-10-CM

## 2012-01-18 NOTE — Patient Instructions (Addendum)
blood tests are being requested for you today.  please call [phone] to hear your test results.  You will be prompted to enter the 9-digit "MRN" number that appears at the top left of this page, followed by #.  Then you will hear the message. Based on the results, you may need to resume the methimazole at a lower dosage. if ever you have fever while taking methimazole, stop it and call us, because of the risk of a rare side-effect.

## 2012-01-18 NOTE — Progress Notes (Signed)
Subjective:    Patient ID: [name], female    DOB: 20-Jul-1931, 78 y.o.   MRN: [id number]  HPI Pt returns for f/u of hyperthyroidism, due to multinodular goiter. She can't do i-131 rx, due to the need ofr prompt normalization of her TFT (atrial fibrillation).  Tapazole was stopped 2 weeks ago, due to hypothyroidism.  Since then, pt says she feels no different, and well in general.   Past Medical History  Diagnosis Date  . Hypopotassemia   . Anemia     iron defficiency  . Chest pain   . Fatigue   . Dyspnea     shortness of breath  . Type II or unspecified type diabetes mellitus without mention of complication, uncontrolled   . Muscle pain   . Degenerative joint disease   . Atrial fibrillation or flutter     s/p RFCA 7/08;   s/p DCCV in past;   previously on amiodarone;  amio stopped due to lung toxicity  . CAD (coronary artery disease)     s/p NSTEMI tx with BMS to OM1 3/08;  cath 3/08: pOM 99% tx with PCI, pLAD 20%, ? mod stenosis at the AM  . HLD (hyperlipidemia)   . HTN (hypertension)     essential nos  . Dystrophy, corneal stromal   . Chronic diastolic heart failure     echo 11/11:  EF 55-60%, severe LVH, mod LAE, mild MR, mildly increased PASP  . Allergy   . Cataract   . CHF (congestive heart failure)   . Diabetes mellitus   . Myocardial infarction   . Osteoporosis   . Thyroid disease     Past Surgical History  Procedure Date  . Cholecystectomy   . Knee surgery   . Eye surgery     cataract surgery OD, corneal implant  . A flutter ablation   . Gallbladder surgery   . Gravid 2 para 2   . Colonoscopy 6/07    2 polyps Dr. Elvina Mattes in [place]  . Cataract surgery 4/07  . Corneal implant 9/08  . Bare metal stent 3/08  . Ablation for a flutter 5/08    dr taylor     History   Social History  . Marital Status: Widowed    Spouse Name: N/A    Number of Children: N/A  . Years of Education: N/A   Occupational History  . Teacher    Social History Main Topics    . Smoking status: Former Smoker    Quit date: 12/27/1968  . Smokeless tobacco: Never Used   Comment: quit 1997  . Alcohol Use: No  . Drug Use: No  . Sexually Active: Not on file   Other Topics Concern  . Not on file   Social History Narrative   Teacher. Widowed. Rarely drinks cafeine.     Current Outpatient Prescriptions on File Prior to Visit  Medication Sig Dispense Refill  . Calcium Carbonate-Vitamin D (CALCIUM 600 + D PO) Take by mouth daily.        . carvedilol (COREG) 25 MG tablet Take 2 tablets (50 mg total) by mouth 2 (two) times daily with a meal.  120 tablet  6  . Cholecalciferol (VITAMIN D3) 1000 UNITS CAPS Take by mouth daily.        . CRESTOR 20 MG tablet TAKE 1 TABLET EVERY DAY  90 tablet  2  . dabigatran (PRADAXA) 150 MG CAPS Take 150 mg by mouth daily.        Marland Kitchen  dabigatran (PRADAXA) 75 MG CAPS Take 1 capsule (75 mg total) by mouth every 12 (twelve) hours.  60 capsule  6  . digoxin (LANOXIN) 0.125 MG tablet 1/2 tablet by mouth daily       . folic acid (FOLVITE) 400 MCG tablet Take 400 mcg by mouth daily.        Marland Kitchen FREESTYLE LITE test strip Use as instructed  100 each  1  . furosemide (LASIX) 40 MG tablet Take 1 tablet (40 mg total) by mouth daily.  90 tablet  1  . insulin glargine (LANTUS) 100 UNIT/ML injection Inject 20 Units into the skin daily.        . Insulin Pen Needle (B-D ULTRAFINE III SHORT PEN) 31G X 8 MM MISC As directed.  100 each  1  . Lancets (FREESTYLE) lancets Check Blood sugar daily as directed  100 each  3  . metFORMIN (GLUCOPHAGE-XR) 500 MG 24 hr tablet TAKE 1 TABLET IN THE MORNING AND 2 TABLETS IN THE EVENING  180 tablet  0  . nitroGLYCERIN (NITROSTAT) 0.4 MG SL tablet Place 0.4 mg under the tongue every 5 (five) minutes as needed.        . OMEGA 3 1000 MG CAPS Take by mouth.        . potassium chloride SA (KLOR-CON M20) 20 MEQ tablet Take 1 tablet (20 mEq total) by mouth daily.  30 tablet  6  . tiotropium (SPIRIVA HANDIHALER) 18 MCG inhalation  capsule Place 18 mcg into inhaler and inhale daily.        . vitamin B-12 (CYANOCOBALAMIN) 1000 MCG tablet Take 1,000 mcg by mouth daily.        Marland Kitchen DISCONTD: digoxin (LANOXIN) 0.125 MG tablet Take 1 tablet (125 mcg total) by mouth daily.  30 tablet  11    Allergies  Allergen Reactions  . Amlodipine Besylate     REACTION: tingling in lips & gum edema  . Lobster (Shellfish Allergy)     angioedema  . Penicillins     REACTION: rash  . Valsartan     REACTION: angioedema  . Codeine     Mental status changes  . Tramadol Hcl     REACTION: nausea    Family History  Problem Relation Age of Onset  . Diabetes Mother   . Hypertension Mother   . Transient ischemic attack Mother   . Heart attack Father 64  . Arthritis Father   . Breast cancer Maternal Aunt   . Arthritis Maternal Aunt   . Arthritis Other   . Hypertension Other   . Diabetes Other     1st degree relative    BP 120/60  Pulse 55  Temp(Src) 97 F (36.1 C) (Oral)  Ht 5\' 3"  (1.6 m)  Wt 158 lb 8 oz (71.895 kg)  BMI 28.08 kg/m2  SpO2 94%    Review of Systems Denies fever    Objective:   Physical Exam VITAL SIGNS:  See vs page GENERAL: no distress NECK: There is no palpable thyroid enlargement.  No thyroid nodule is palpable.  No palpable lymphadenopathy at the anterior neck.      Assessment & Plan:  Hyperthyroidism, now off tapazole.  She prob needs to resume at a lower dosage.

## 2012-01-19 LAB — TSH: TSH: 1.73 u[IU]/mL (ref 0.35–5.50)

## 2012-01-20 ENCOUNTER — Other Ambulatory Visit: Payer: Self-pay | Admitting: Endocrinology

## 2012-01-20 ENCOUNTER — Telehealth: Payer: Self-pay | Admitting: *Deleted

## 2012-01-20 MED ORDER — METHIMAZOLE 5 MG PO TABS: 5.0000 mg | ORAL_TABLET | Freq: Three times a day (TID) | ORAL | Status: DC

## 2012-01-20 NOTE — Telephone Encounter (Signed)
Pt called requesting results of thyroid blood test and MD's advisement regarding thyroid medication. (Results not on phone tree)

## 2012-01-22 ENCOUNTER — Other Ambulatory Visit: Payer: Self-pay | Admitting: Internal Medicine

## 2012-01-27 NOTE — Telephone Encounter (Signed)
Pt informed of results on 01/20/2012-per result note. Closing phone note.

## 2012-02-23 ENCOUNTER — Other Ambulatory Visit: Payer: Self-pay | Admitting: Internal Medicine

## 2012-02-23 NOTE — Telephone Encounter (Signed)
Prescription sent to pharmacy.

## 2012-03-02 ENCOUNTER — Other Ambulatory Visit: Payer: Self-pay | Admitting: Endocrinology

## 2012-03-05 ENCOUNTER — Other Ambulatory Visit: Payer: Self-pay | Admitting: Internal Medicine

## 2012-03-06 ENCOUNTER — Other Ambulatory Visit: Payer: Self-pay | Admitting: Internal Medicine

## 2012-03-06 NOTE — Telephone Encounter (Signed)
Prescription sent to pharmacy.

## 2012-03-16 ENCOUNTER — Telehealth: Payer: Self-pay | Admitting: Internal Medicine

## 2012-03-16 NOTE — Telephone Encounter (Signed)
Pradaxa 75 mg twice a day, 48 sample capsules placed at the front desk for pt to peak up. Patient aware.

## 2012-03-16 NOTE — Telephone Encounter (Signed)
PT CALLING TO SEE IF WE HAVE SAMPLES OF PRADAXA , PLS CALL

## 2012-03-31 ENCOUNTER — Other Ambulatory Visit: Payer: Self-pay | Admitting: Internal Medicine

## 2012-04-07 ENCOUNTER — Other Ambulatory Visit (INDEPENDENT_AMBULATORY_CARE_PROVIDER_SITE_OTHER): Payer: Medicare Other

## 2012-04-07 ENCOUNTER — Encounter: Payer: Self-pay | Admitting: Endocrinology

## 2012-04-07 ENCOUNTER — Ambulatory Visit (INDEPENDENT_AMBULATORY_CARE_PROVIDER_SITE_OTHER): Payer: Medicare Other | Admitting: Endocrinology

## 2012-04-07 VITALS — BP 142/68 | HR 78 | Temp 98.3°F | Ht 63.0 in | Wt 156.0 lb

## 2012-04-07 DIAGNOSIS — E0590 Thyrotoxicosis, unspecified without thyrotoxic crisis or storm: Secondary | ICD-10-CM

## 2012-04-07 LAB — TSH: TSH: 2.47 u[IU]/mL (ref 0.35–5.50)

## 2012-04-07 LAB — T4, FREE: Free T4: 0.7 ng/dL (ref 0.60–1.60)

## 2012-04-07 NOTE — Patient Instructions (Addendum)
blood tests are being requested for you today.  You will receive a letter with results. if ever you have fever while taking methimazole, stop it and call us, because of the risk of a rare side-effect. Please come back for a follow-up appointment in 3 months. (see letter)

## 2012-04-07 NOTE — Progress Notes (Signed)
Subjective:    Patient ID: [name], female    DOB: 12-11-1931, 78 y.o.   MRN: [id number]  HPI Pt returns for f/u of hyperthyroidism, due to multinodular goiter. She can't do i-131 rx, due to the need ofr prompt normalization of her TFT (atrial fibrillation).  Tapazole was stopped a few mos ago, then resumed at 5 mg qd, 3 mo ago.  pt states she feels well in general. Past Medical History  Diagnosis Date  . Hypopotassemia   . Anemia     iron defficiency  . Chest pain   . Fatigue   . Dyspnea     shortness of breath  . Type II or unspecified type diabetes mellitus without mention of complication, uncontrolled   . Muscle pain   . Degenerative joint disease   . Atrial fibrillation or flutter     s/p RFCA 7/08;   s/p DCCV in past;   previously on amiodarone;  amio stopped due to lung toxicity  . CAD (coronary artery disease)     s/p NSTEMI tx with BMS to OM1 3/08;  cath 3/08: pOM 99% tx with PCI, pLAD 20%, ? mod stenosis at the AM  . HLD (hyperlipidemia)   . HTN (hypertension)     essential nos  . Dystrophy, corneal stromal   . Chronic diastolic heart failure     echo 11/11:  EF 55-60%, severe LVH, mod LAE, mild MR, mildly increased PASP  . Allergy   . Cataract   . CHF (congestive heart failure)   . Diabetes mellitus   . Myocardial infarction   . Osteoporosis   . Thyroid disease     Past Surgical History  Procedure Date  . Cholecystectomy   . Knee surgery   . Eye surgery     cataract surgery OD, corneal implant  . A flutter ablation   . Gallbladder surgery   . Gravid 2 para 2   . Colonoscopy 6/07    2 polyps Dr. Elvina Mattes in [place]  . Cataract surgery 4/07  . Corneal implant 9/08  . Bare metal stent 3/08  . Ablation for a flutter 5/08    dr taylor     History   Social History  . Marital Status: Widowed    Spouse Name: N/A    Number of Children: N/A  . Years of Education: N/A   Occupational History  . Teacher    Social History Main Topics  . Smoking  status: Former Smoker    Quit date: 12/27/1968  . Smokeless tobacco: Never Used   Comment: quit 1997  . Alcohol Use: No  . Drug Use: No  . Sexually Active: Not on file   Other Topics Concern  . Not on file   Social History Narrative   Teacher. Widowed. Rarely drinks cafeine.     Current Outpatient Prescriptions on File Prior to Visit  Medication Sig Dispense Refill  . B-D ULTRAFINE III SHORT PEN 31G X 8 MM MISC USE AS DIRECTED  100 each  3  . Calcium Carbonate-Vitamin D (CALCIUM 600 + D PO) Take by mouth daily.        . carvedilol (COREG) 25 MG tablet TAKE 2 TABLETS TWICE A DAY WITH FOOD  120 tablet  6  . Cholecalciferol (VITAMIN D3) 1000 UNITS CAPS Take by mouth daily.        . CRESTOR 20 MG tablet TAKE 1 TABLET EVERY DAY  90 tablet  2  . dabigatran (PRADAXA) 75 MG  CAPS Take 1 capsule (75 mg total) by mouth every 12 (twelve) hours.  60 capsule  6  . digoxin (LANOXIN) 0.125 MG tablet 1/2 tablet by mouth daily       . folic acid (FOLVITE) 400 MCG tablet Take 400 mcg by mouth daily.        . furosemide (LASIX) 40 MG tablet TAKE 1 TABLET (40 MG TOTAL) BY MOUTH DAILY.  90 tablet  1  . insulin glargine (LANTUS) 100 UNIT/ML injection Inject 20 Units into the skin daily.        Marland Kitchen KLOR-CON M20 20 MEQ tablet TAKE 1 TABLET EVERY DAY  30 tablet  6  . Lancets (FREESTYLE) lancets Check Blood sugar daily as directed  100 each  3  . metFORMIN (GLUCOPHAGE-XR) 500 MG 24 hr tablet TAKE 1 TABLET BY MOUTH IN THE MORNING AND 2 TABLETS IN THE EVENING  180 tablet  3  . methimazole (TAPAZOLE) 5 MG tablet Take 5 mg by mouth daily.      . nitroGLYCERIN (NITROSTAT) 0.4 MG SL tablet Place 0.4 mg under the tongue every 5 (five) minutes as needed.        . OMEGA 3 1000 MG CAPS Take by mouth.        . SPIRIVA HANDIHALER 18 MCG inhalation capsule INHALE CONTENTS OF 1 CAPSULE EVERY DAY  90 each  1  . vitamin B-12 (CYANOCOBALAMIN) 1000 MCG tablet Take 1,000 mcg by mouth daily.        Marland Kitchen DISCONTD: digoxin (LANOXIN)  0.125 MG tablet Take 1 tablet (125 mcg total) by mouth daily.  30 tablet  11    Allergies  Allergen Reactions  . Amlodipine Besylate     REACTION: tingling in lips & gum edema  . Lobster (Shellfish Allergy)     angioedema  . Penicillins     REACTION: rash  . Valsartan     REACTION: angioedema  . Codeine     Mental status changes  . Tramadol Hcl     REACTION: nausea    Family History  Problem Relation Age of Onset  . Diabetes Mother   . Hypertension Mother   . Transient ischemic attack Mother   . Heart attack Father 51  . Arthritis Father   . Breast cancer Maternal Aunt   . Arthritis Maternal Aunt   . Arthritis Other   . Hypertension Other   . Diabetes Other     1st degree relative    BP 142/68  Pulse 78  Temp(Src) 98.3 F (36.8 C) (Oral)  Ht 5\' 3"  (1.6 m)  Wt 156 lb (70.761 kg)  BMI 27.63 kg/m2  SpO2 95%   Review of Systems Denies fever.     Objective:   Physical Exam VITAL SIGNS:  See vs page. GENERAL: no distress. NECK: There is no palpable thyroid enlargement.  No thyroid nodule is palpable.  No palpable lymphadenopathy at the anterior neck.   Lab Results  Component Value Date   TSH 2.47 04/07/2012      Assessment & Plan:  Hyperthyroidism, well-controlled

## 2012-04-08 ENCOUNTER — Encounter: Payer: Self-pay | Admitting: Endocrinology

## 2012-04-11 ENCOUNTER — Telehealth: Payer: Self-pay | Admitting: *Deleted

## 2012-04-11 NOTE — Telephone Encounter (Signed)
Called pt to inform of lab results, pt informed. (Letter also mailed to pt)

## 2012-04-12 ENCOUNTER — Telehealth: Payer: Self-pay | Admitting: Internal Medicine

## 2012-04-12 MED ORDER — TIOTROPIUM BROMIDE MONOHYDRATE 18 MCG IN CAPS: ORAL_CAPSULE | RESPIRATORY_TRACT | Status: DC

## 2012-04-12 NOTE — Telephone Encounter (Signed)
Patient called & states her daughter is in the office getting labs done & she would like for [place] to provider her daughter with samples of the following Prodaxa & Spiriva Patient ph# [phone]

## 2012-04-12 NOTE — Telephone Encounter (Signed)
Samples of sprivia given to patient's daughter. We did not have samples of Pradaxa

## 2012-05-09 ENCOUNTER — Encounter: Payer: Self-pay | Admitting: Internal Medicine

## 2012-05-09 ENCOUNTER — Ambulatory Visit (INDEPENDENT_AMBULATORY_CARE_PROVIDER_SITE_OTHER): Payer: Medicare Other | Admitting: Internal Medicine

## 2012-05-09 VITALS — BP 128/70 | HR 66 | Ht 63.5 in | Wt 156.8 lb

## 2012-05-09 DIAGNOSIS — I4891 Unspecified atrial fibrillation: Secondary | ICD-10-CM

## 2012-05-09 DIAGNOSIS — I10 Essential (primary) hypertension: Secondary | ICD-10-CM

## 2012-05-09 NOTE — Assessment & Plan Note (Signed)
Her symptoms appear to be well-controlled. She will continue her current medical therapy. 

## 2012-05-09 NOTE — Assessment & Plan Note (Signed)
Her blood pressure today is well controlled. She will continue her current medical therapy, continue her current level of physical activity, and maintain a low-sodium diet.

## 2012-05-09 NOTE — Patient Instructions (Signed)
Your physician wants you to follow-up in: 12 months with Dr. Taylor. You will receive a reminder letter in the mail two months in advance. If you don't receive a letter, please call our office to schedule the follow-up appointment.    

## 2012-05-09 NOTE — Progress Notes (Signed)
HPI [name] returns today for followup. 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 In the interim, the patient is a well. She denies cough or hemoptysis. No chest pain or shortness of breath. She exercises several times a week without limitation. 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 Her cough has resolved. Allergies  Allergen Reactions  . Amlodipine Besylate     REACTION: tingling in lips & gum edema  . Lobster (Shellfish Allergy)     angioedema  . Penicillins     REACTION: rash  . Valsartan     REACTION: angioedema  . Codeine     Mental status changes  . Tramadol Hcl     REACTION: nausea     Current Outpatient Prescriptions  Medication Sig Dispense Refill  . B-D ULTRAFINE III SHORT PEN 31G X 8 MM MISC USE AS DIRECTED  100 each  3  . Calcium Carbonate-Vitamin D (CALCIUM 600 + D PO) Take by mouth daily.        . carvedilol (COREG) 25 MG tablet TAKE 2 TABLETS TWICE A DAY WITH FOOD  120 tablet  6  . Cholecalciferol (VITAMIN D3) 1000 UNITS CAPS Take by mouth daily.        . CRESTOR 20 MG tablet TAKE 1 TABLET EVERY DAY  90 tablet  2  . dabigatran (PRADAXA) 75 MG CAPS Take 1 capsule (75 mg total) by mouth every 12 (twelve) hours.  60 capsule  6  . digoxin (LANOXIN) 0.125 MG tablet 62.5 mcg. 1/2 tablet by mouth daily      . folic acid (FOLVITE) 400 MCG tablet Take 400 mcg by mouth daily.        . furosemide (LASIX) 40 MG tablet TAKE 1 TABLET (40 MG TOTAL) BY MOUTH DAILY.  90 tablet  1  . insulin glargine (LANTUS) 100 UNIT/ML injection Inject 22 Units into the skin daily.       Marland Kitchen KLOR-CON M20 20 MEQ tablet TAKE 1 TABLET EVERY DAY  30 tablet  6  . Lancets (FREESTYLE) lancets Check Blood sugar daily as directed  100 each  3  . metFORMIN (GLUCOPHAGE-XR) 500 MG 24 hr tablet  TAKE 1 TABLET BY MOUTH IN THE MORNING AND 2 TABLETS IN THE EVENING  180 tablet  3  . methimazole (TAPAZOLE) 5 MG tablet Take 5 mg by mouth daily.      . nitroGLYCERIN (NITROSTAT) 0.4 MG SL tablet Place 0.4 mg under the tongue every 5 (five) minutes as needed.        . OMEGA 3 1000 MG CAPS Take by mouth.        . tiotropium (SPIRIVA HANDIHALER) 18 MCG inhalation capsule INHALE CONTENTS OF 1 CAPSULE EVERY DAY  1 capsule  1  . vitamin B-12 (CYANOCOBALAMIN) 1000 MCG tablet Take 1,000 mcg by mouth daily.        Marland Kitchen DISCONTD: digoxin (LANOXIN) 0.125 MG tablet Take 1 tablet (125 mcg total) by mouth daily.  30 tablet  11     Past Medical History  Diagnosis Date  . Hypopotassemia   . Anemia     iron defficiency  . Chest pain   . Fatigue   . Dyspnea     shortness of breath  . Type II or unspecified type diabetes mellitus without mention of complication, uncontrolled   .  Muscle pain   . Degenerative joint disease   . Atrial fibrillation or flutter     s/p RFCA 7/08;   s/p DCCV in past;   previously on amiodarone;  amio stopped due to lung toxicity  . CAD (coronary artery disease)     s/p NSTEMI tx with BMS to OM1 3/08;  cath 3/08: pOM 99% tx with PCI, pLAD 20%, ? mod stenosis at the AM  . HLD (hyperlipidemia)   . HTN (hypertension)     essential nos  . Dystrophy, corneal stromal   . Chronic diastolic heart failure     echo 11/11:  EF 55-60%, severe LVH, mod LAE, mild MR, mildly increased PASP  . Allergy   . Cataract   . CHF (congestive heart failure)   . Diabetes mellitus   . Myocardial infarction   . Osteoporosis   . Thyroid disease     ROS:   All systems reviewed and negative except as noted in the HPI.   Past Surgical History  Procedure Date  . Cholecystectomy   . Knee surgery   . Eye surgery     cataract surgery OD, corneal implant  . A flutter ablation   . Gallbladder surgery   . Gravid 2 para 2   . Colonoscopy 6/07    2 polyps Dr. Elvina Mattes in [place]  . Cataract surgery  4/07  . Corneal implant 9/08  . Bare metal stent 3/08  . Ablation for a flutter 5/08    dr Chyler Creely      Family History  Problem Relation Age of Onset  . Diabetes Mother   . Hypertension Mother   . Transient ischemic attack Mother   . Heart attack Father 35  . Arthritis Father   . Breast cancer Maternal Aunt   . Arthritis Maternal Aunt   . Arthritis Other   . Hypertension Other   . Diabetes Other     1st degree relative     History   Social History  . Marital Status: Widowed    Spouse Name: N/A    Number of Children: N/A  . Years of Education: N/A   Occupational History  . Teacher    Social History Main Topics  . Smoking status: Former Smoker    Quit date: 12/27/1968  . Smokeless tobacco: Never Used   Comment: quit 1997  . Alcohol Use: No  . Drug Use: No  . Sexually Active: Not on file   Other Topics Concern  . Not on file   Social History Narrative   Teacher. Widowed. Rarely drinks cafeine.      BP 128/70  Pulse 66  Ht 5' 3.5" (1.613 m)  Wt 156 lb 12.8 oz (71.124 kg)  BMI 27.34 kg/m2  Physical Exam:  Well appearing 78 year old woman, NAD HEENT: Unremarkable Neck:  No JVD, no thyromegally Lungs:  Clear with no wheezes, rales, or rhonchi. HEART:  Regular rate rhythm, no murmurs, no rubs, no clicks Abd:  soft, positive bowel sounds, no organomegally, no rebound, no guarding Ext:  2 plus pulses, no edema, no cyanosis, no clubbing Skin:  No rashes no nodules Neuro:  CN II through XII intact, motor grossly intact  EKG Atrial flutter with a controlled ventricular response. Atypical.  Assess/Plan:

## 2012-05-29 ENCOUNTER — Encounter: Payer: Self-pay | Admitting: Internal Medicine

## 2012-05-29 ENCOUNTER — Ambulatory Visit (INDEPENDENT_AMBULATORY_CARE_PROVIDER_SITE_OTHER): Payer: Medicare Other | Admitting: Internal Medicine

## 2012-05-29 VITALS — BP 118/70 | HR 77 | Temp 97.8°F | Resp 12 | Ht 63.0 in | Wt 156.0 lb

## 2012-05-29 DIAGNOSIS — I10 Essential (primary) hypertension: Secondary | ICD-10-CM

## 2012-05-29 DIAGNOSIS — IMO0001 Reserved for inherently not codable concepts without codable children: Secondary | ICD-10-CM

## 2012-05-29 DIAGNOSIS — E042 Nontoxic multinodular goiter: Secondary | ICD-10-CM

## 2012-05-29 DIAGNOSIS — Z0000 Encounter for general adult medical examination without abnormal findings: Secondary | ICD-10-CM

## 2012-05-29 DIAGNOSIS — D509 Iron deficiency anemia, unspecified: Secondary | ICD-10-CM

## 2012-05-29 DIAGNOSIS — D510 Vitamin B12 deficiency anemia due to intrinsic factor deficiency: Secondary | ICD-10-CM

## 2012-05-29 DIAGNOSIS — E782 Mixed hyperlipidemia: Secondary | ICD-10-CM

## 2012-05-29 NOTE — Patient Instructions (Signed)
Please try to go on My Chart within the next 24 hours to allow me to release the results directly to you.   Apply Nizoral twice a day until rash is gone.  Use an anti-inflammatory cream such as Aspercreme or Zostrix cream twice a day to the thumbs as needed. In lieu of this warm moist compresses or  hot water bottle can be used. Do not apply ice!

## 2012-05-29 NOTE — Progress Notes (Signed)
Subjective:    Patient ID: [name], female    DOB: 1931-03-24, 78 y.o.   MRN: [id number]  HPI Medicare Wellness Visit:  The following psychosocial & medical history were reviewed as required by Medicare.   Social history: caffeine: 1 cup coffee/day , alcohol: no ,  tobacco use : quit 1970  & exercise : 3 X/week as water aerobics & yoga 2X/ week & walking X 2 days.   Home & personal  safety / fall risk: no issues, activities of daily living: no limitations , seatbelt use : yes , and smoke alarm employment : yes .  Power of Attorney/Living Will status : needed  Vision ( as recorded per Nurse) & Hearing  evaluation :  Ophth exam 2 mos ago([facility]). See exam. Orientation :oriented X 3 , memory & recall :good, spelling  testing: good,and mood & affect : normal . Depression / anxiety: denied Travel history : never , immunization status :Shingles needed  , transfusion history:  no, and preventive health surveillance ( colonoscopies, BMD , etc as per protocol/ SOC): up to date, Dental care:  Seen annually . Chart reviewed &  Updated. Active issues reviewed & addressed.       Review of Systems HYPERTENSION: Disease Monitoring: Blood pressure range-not monitored Chest pain, palpitations- no but in AF       Dyspnea- no Medications: Compliance- yes  Lightheadedness,Syncope- no    Edema- no  DIABETES: Disease Monitoring: Blood Sugar ranges- FBS 90-130; no post meal glucoses  Polyuria/phagia/dipsia- no       Visual problems-no Medications: Compliance- yes  Hypoglycemic symptoms- rarely @ 6 am  HYPERLIPIDEMIA: Disease Monitoring: See symptoms for Hypertension Medications: Compliance- yes  Abd pain, bowel changes- no   Muscle aches- minor in forearms         Objective:   Physical Exam Gen.: Healthy and well-nourished in appearance. Alert, appropriate and cooperative throughout exam.Appears younger than stated age  Head: Normocephalic without obvious abnormalities  Eyes: No  corneal or conjunctival inflammation noted.  Extraocular motion intact.  Ears: External  ear exam reveals no significant lesions or deformities. Canals clear .TMs normal. Hearing is grossly normal bilaterally. Nose: External nasal exam reveals no deformity or inflammation. Nasal mucosa are pink and moist. No lesions or exudates noted.  Mouth: Oral mucosa and oropharynx reveal no lesions or exudates. Teeth in good repair; upper partial. Neck: No deformities, masses, or tenderness noted. Range of motion normal.Thyroid small. Lungs: Normal respiratory effort; chest expands symmetrically. Lungs are clear to auscultation without rales, wheezes, or increased work of breathing. Heart: Normal rate and irregular  rhythm. Normal S1 and S2. No gallop, click, or rub. No murmur. Abdomen: Bowel sounds normal; abdomen soft and nontender. No masses, organomegaly or hernias noted. Genitalia: Dr Henderson Cloud                          Musculoskeletal/extremities: No deformity or scoliosis noted of  the thoracic or lumbar spine. No clubbing, cyanosis, edema noted. Range of motion  normal .Tone & strength  normal.Joints : OA hand changes .She has fusiform changes of the knees with minimal crepitus. She also has fusiform changes of the DIPs joints of the thumbs. Nail health  good. Vascular: Carotid, radial artery, dorsalis pedis and  posterior tibial pulses are full and equal. No bruits present. Neurologic: Alert and oriented x3. Deep tendon reflexes symmetrical and normal.          Skin: 3  x 2 cm raised , bland lesion at left elbow. Serpiginous border Lymph: No cervical, axillary lymphadenopathy present. Psych: Mood and affect are normal. Normally interactive                                                                                         Assessment & Plan:  #1 Medicare Wellness Exam; criteria met ; data entered #2 Problem List reviewed ; Assessment/ Recommendations made Plan: see Orders

## 2012-05-30 LAB — CBC WITH DIFFERENTIAL/PLATELET
Basophils Absolute: 0 10*3/uL (ref 0.0–0.1)
Eosinophils Absolute: 0.3 10*3/uL (ref 0.0–0.7)
Hemoglobin: 12.9 g/dL (ref 12.0–15.0)
Lymphocytes Relative: 24.6 % (ref 12.0–46.0)
MCHC: 32.4 g/dL (ref 30.0–36.0)
Monocytes Relative: 9.9 % (ref 3.0–12.0)
Neutro Abs: 3 10*3/uL (ref 1.4–7.7)
Neutrophils Relative %: 59.8 % (ref 43.0–77.0)
RBC: 4.43 Mil/uL (ref 3.87–5.11)
RDW: 13 % (ref 11.5–14.6)

## 2012-05-30 LAB — LIPID PANEL
HDL: 41.5 mg/dL (ref >39.00)
LDL Cholesterol: 61 mg/dL (ref 0–99)
Total CHOL/HDL Ratio: 3
Triglycerides: 133 mg/dL (ref 0.0–149.0)
VLDL: 26.6 mg/dL (ref 0.0–40.0)

## 2012-05-30 LAB — BASIC METABOLIC PANEL
Calcium: 9.7 mg/dL (ref 8.4–10.5)
Creatinine, Ser: 1.2 mg/dL (ref 0.4–1.2)
GFR: 56.89 mL/min — ABNORMAL LOW (ref >60.00)
Glucose, Bld: 186 mg/dL — ABNORMAL HIGH (ref 70–99)
Sodium: 140 mEq/L (ref 135–145)

## 2012-05-30 LAB — MICROALBUMIN / CREATININE URINE RATIO: Microalb, Ur: 2 mg/dL — ABNORMAL HIGH (ref 0.0–1.9)

## 2012-05-30 LAB — HEPATIC FUNCTION PANEL
Alkaline Phosphatase: 69 U/L (ref 39–117)
Bilirubin, Direct: 0.1 mg/dL (ref 0.0–0.3)
Total Bilirubin: 0.5 mg/dL (ref 0.3–1.2)
Total Protein: 8.2 g/dL (ref 6.0–8.3)

## 2012-05-30 LAB — VITAMIN B12: Vitamin B-12: 613 pg/mL (ref 211–911)

## 2012-06-04 ENCOUNTER — Other Ambulatory Visit: Payer: Self-pay | Admitting: Internal Medicine

## 2012-06-04 DIAGNOSIS — IMO0001 Reserved for inherently not codable concepts without codable children: Secondary | ICD-10-CM

## 2012-06-06 ENCOUNTER — Telehealth: Payer: Self-pay | Admitting: Internal Medicine

## 2012-06-06 ENCOUNTER — Encounter: Payer: Self-pay | Admitting: *Deleted

## 2012-06-06 NOTE — Telephone Encounter (Signed)
Samples outfront 

## 2012-06-06 NOTE — Telephone Encounter (Signed)
Please return call to patient at [phone] regarding Pradaxa samples.

## 2012-06-10 ENCOUNTER — Other Ambulatory Visit: Payer: Self-pay | Admitting: Endocrinology

## 2012-06-12 MED ORDER — METHIMAZOLE 5 MG PO TABS: 5.0000 mg | ORAL_TABLET | Freq: Every day | ORAL | Status: DC

## 2012-06-27 ENCOUNTER — Other Ambulatory Visit: Payer: Self-pay | Admitting: Internal Medicine

## 2012-06-27 MED ORDER — DABIGATRAN ETEXILATE MESYLATE 75 MG PO CAPS: 75.0000 mg | ORAL_CAPSULE | Freq: Two times a day (BID) | ORAL | Status: DC

## 2012-07-07 ENCOUNTER — Ambulatory Visit (INDEPENDENT_AMBULATORY_CARE_PROVIDER_SITE_OTHER): Payer: Medicare Other | Admitting: Endocrinology

## 2012-07-07 ENCOUNTER — Other Ambulatory Visit (INDEPENDENT_AMBULATORY_CARE_PROVIDER_SITE_OTHER): Payer: Medicare Other

## 2012-07-07 ENCOUNTER — Encounter: Payer: Self-pay | Admitting: Endocrinology

## 2012-07-07 VITALS — BP 148/68 | HR 67 | Temp 97.0°F | Ht 63.0 in | Wt 159.0 lb

## 2012-07-07 DIAGNOSIS — E0590 Thyrotoxicosis, unspecified without thyrotoxic crisis or storm: Secondary | ICD-10-CM

## 2012-07-07 LAB — TSH: TSH: 7.99 u[IU]/mL — ABNORMAL HIGH (ref 0.35–5.50)

## 2012-07-07 NOTE — Patient Instructions (Addendum)
blood tests are being requested for you today.  You will receive a letter with results. if ever you have fever while taking methimazole, stop it and call us, because of the risk of a rare side-effect. Please come back for a follow-up appointment in 4 months.

## 2012-07-07 NOTE — Progress Notes (Signed)
Subjective:    Patient ID: [name], female    DOB: 1931/01/06, 78 y.o.   MRN: [id number]  HPI Pt returns for f/u of hyperthyroidism, due to multinodular goiter. She can't do i-131 rx, due to the need ofr prompt normalization of her TFT (atrial fibrillation).  Tapazole was stopped 6 mos ago due to elev tsh, then resumed at 5 mg qd, approx 5 mos ago.  pt states she feels well in general.   Past Medical History  Diagnosis Date  . Hypopotassemia     PMH of  . Anemia     iron defficiency  . Type II or unspecified type diabetes mellitus without mention of complication, uncontrolled     Dr Everardo All  . Muscle pain   . Degenerative joint disease   . Atrial fibrillation or flutter     s/p RFCA 7/08;   s/p DCCV in past;   previously on amiodarone;  amio stopped due to lung toxicity  . CAD (coronary artery disease)     s/p NSTEMI tx with BMS to OM1 3/08;  cath 3/08: pOM 99% tx with PCI, pLAD 20%, ? mod stenosis at the AM  . HLD (hyperlipidemia)   . HTN (hypertension)     essential nos  . Dystrophy, corneal stromal   . Chronic diastolic heart failure     echo 11/11:  EF 55-60%, severe LVH, mod LAE, mild MR, mildly increased PASP  . Allergy     seasonal  . Cataract   . CHF (congestive heart failure)   . Myocardial infarction 2008  . Osteoporosis   . Thyroid disease     hypothyroidism    Past Surgical History  Procedure Date  . Cholecystectomy   . Knee surgery   . Eye surgery     cataract surgery & corneal implant bilaterally  . A flutter ablation     Dr Ladona Ridgel  . Gravid 2 para 2   . Colonoscopy 6/07    2 polyps Dr. Elvina Mattes in [place]  . Bare metal stent 3/08    Dr Ladona Ridgel    History   Social History  . Marital Status: Widowed    Spouse Name: N/A    Number of Children: N/A  . Years of Education: N/A   Occupational History  . Teacher    Social History Main Topics  . Smoking status: Former Smoker    Quit date: 12/27/1968  . Smokeless tobacco: Never Used   Comment:  quit 1997  . Alcohol Use: No  . Drug Use: No  . Sexually Active: Not on file   Other Topics Concern  . Not on file   Social History Narrative   Teacher. Widowed. Rarely drinks cafeine.     Current Outpatient Prescriptions on File Prior to Visit  Medication Sig Dispense Refill  . B-D ULTRAFINE III SHORT PEN 31G X 8 MM MISC USE AS DIRECTED  100 each  3  . Calcium Carbonate-Vitamin D (CALCIUM 600 + D PO) Take by mouth daily.        . carvedilol (COREG) 25 MG tablet TAKE 2 TABLETS TWICE A DAY WITH FOOD  120 tablet  6  . Cholecalciferol (VITAMIN D3) 1000 UNITS CAPS Take by mouth daily.        . CRESTOR 20 MG tablet TAKE 1 TABLET EVERY DAY  90 tablet  2  . dabigatran (PRADAXA) 75 MG CAPS Take 1 capsule (75 mg total) by mouth every 12 (twelve) hours.  180 capsule  3  . digoxin (LANOXIN) 0.125 MG tablet 62.5 mcg. 1/2 tablet by mouth daily      . folic acid (FOLVITE) 400 MCG tablet Take 400 mcg by mouth daily.        . furosemide (LASIX) 40 MG tablet TAKE 1 TABLET (40 MG TOTAL) BY MOUTH DAILY.  90 tablet  1  . insulin glargine (LANTUS) 100 UNIT/ML injection Inject 22 Units into the skin daily.       Marland Kitchen KLOR-CON M20 20 MEQ tablet TAKE 1 TABLET EVERY DAY  30 tablet  6  . Lancets (FREESTYLE) lancets Check Blood sugar daily as directed  100 each  3  . metFORMIN (GLUCOPHAGE-XR) 500 MG 24 hr tablet TAKE 1 TABLET BY MOUTH IN THE MORNING AND 2 TABLETS IN THE EVENING  180 tablet  3  . methimazole (TAPAZOLE) 5 MG tablet Take 1 tablet (5 mg total) by mouth daily.  30 tablet  5  . nitroGLYCERIN (NITROSTAT) 0.4 MG SL tablet Place 0.4 mg under the tongue every 5 (five) minutes as needed.        . OMEGA 3 1000 MG CAPS Take by mouth.        . tiotropium (SPIRIVA HANDIHALER) 18 MCG inhalation capsule INHALE CONTENTS OF 1 CAPSULE EVERY DAY  1 capsule  1  . vitamin B-12 (CYANOCOBALAMIN) 1000 MCG tablet Take 1,000 mcg by mouth daily.        Marland Kitchen DISCONTD: digoxin (LANOXIN) 0.125 MG tablet Take 1 tablet (125 mcg  total) by mouth daily.  30 tablet  11    Allergies  Allergen Reactions  . Amlodipine Besylate     REACTION: tingling in lips & gum edema  . Lobster (Shellfish Allergy)     angioedema  . Penicillins     REACTION: rash  . Valsartan     REACTION: angioedema  . Codeine     Mental status changes  . Tramadol Hcl     REACTION: nausea    Family History  Problem Relation Age of Onset  . Diabetes Mother   . Hypertension Mother   . Transient ischemic attack Mother   . Heart attack Father 56  . Arthritis Father   . Breast cancer Maternal Aunt   . Arthritis Maternal Aunt   . Arthritis Mother   . Hypertension Father     BP 148/68  Pulse 67  Temp 97 F (36.1 C) (Oral)  Ht 5\' 3"  (1.6 m)  Wt 159 lb (72.122 kg)  BMI 28.17 kg/m2  SpO2 95%  Review of Systems Denies fever     Objective:   Physical Exam VITAL SIGNS:  See vs page.   GENERAL: no distress. NECK: There is no palpable thyroid enlargement.  No thyroid nodule is palpable.  No palpable lymphadenopathy at the anterior neck.   Lab Results  Component Value Date   TSH 7.99* 07/07/2012      Assessment & Plan:  Hyperthyroidism, slightly overcontrolled

## 2012-07-10 ENCOUNTER — Other Ambulatory Visit: Payer: Self-pay | Admitting: *Deleted

## 2012-07-10 ENCOUNTER — Other Ambulatory Visit: Payer: Self-pay | Admitting: Internal Medicine

## 2012-07-10 MED ORDER — DIGOXIN 125 MCG PO TABS: 62.5000 ug | ORAL_TABLET | Freq: Every day | ORAL | Status: DC

## 2012-07-10 MED ORDER — POTASSIUM CHLORIDE CRYS ER 20 MEQ PO TBCR: 20.0000 meq | EXTENDED_RELEASE_TABLET | Freq: Every day | ORAL | Status: DC

## 2012-07-10 NOTE — Telephone Encounter (Signed)
refill MetFORMIN HCl (Tablet SR 24 hr) GLUCOPHAGE-XR 500 MG TAKE 1 TABLET BY MOUTH IN THE MORNING AND 2 TABLETS IN THE EVENING wants a 90-day supply Last fill 3.10.13 #180 Last ov 6.3.13

## 2012-07-11 ENCOUNTER — Other Ambulatory Visit: Payer: Self-pay | Admitting: *Deleted

## 2012-07-11 MED ORDER — METFORMIN HCL ER 500 MG PO TB24: ORAL_TABLET | ORAL | Status: DC

## 2012-07-11 MED ORDER — METHIMAZOLE 5 MG PO TABS: 5.0000 mg | ORAL_TABLET | Freq: Every day | ORAL | Status: DC

## 2012-07-11 NOTE — Telephone Encounter (Signed)
RX sent

## 2012-07-12 ENCOUNTER — Other Ambulatory Visit: Payer: Self-pay | Admitting: *Deleted

## 2012-07-12 MED ORDER — CARVEDILOL 25 MG PO TABS: ORAL_TABLET | ORAL | Status: DC

## 2012-07-12 NOTE — Telephone Encounter (Signed)
Refilled carvedilol.

## 2012-07-19 ENCOUNTER — Other Ambulatory Visit: Payer: Self-pay | Admitting: Internal Medicine

## 2012-08-15 ENCOUNTER — Other Ambulatory Visit: Payer: Self-pay | Admitting: *Deleted

## 2012-08-15 MED ORDER — METHIMAZOLE 5 MG PO TABS: 5.0000 mg | ORAL_TABLET | ORAL | Status: DC

## 2012-08-15 NOTE — Telephone Encounter (Signed)
R'cd fax from Methimazole for 90 day supply from [facility].

## 2012-09-08 ENCOUNTER — Other Ambulatory Visit: Payer: Self-pay | Admitting: Internal Medicine

## 2012-09-15 ENCOUNTER — Other Ambulatory Visit: Payer: Self-pay | Admitting: Internal Medicine

## 2012-09-15 NOTE — Telephone Encounter (Signed)
Pt would like daughter (is coming in this morning) to pick up presc for : SPIRIVA and CRESTOR. I let the pt know that it is 24 hrs turn over time but we would try our best to help her.

## 2012-09-15 NOTE — Telephone Encounter (Signed)
Samples given to patients daughter. 

## 2012-09-22 ENCOUNTER — Telehealth: Payer: Self-pay | Admitting: Internal Medicine

## 2012-09-22 ENCOUNTER — Other Ambulatory Visit: Payer: Self-pay | Admitting: Internal Medicine

## 2012-09-22 MED ORDER — INSULIN GLARGINE 100 UNIT/ML ~~LOC~~ SOLN: 22.0000 [IU] | Freq: Every day | SUBCUTANEOUS | Status: DC

## 2012-09-22 NOTE — Telephone Encounter (Signed)
RX sent

## 2012-09-22 NOTE — Telephone Encounter (Signed)
Caller: [name]/Patient; Patient Name: [name]; PCP: Marga Melnick; Best Callback Phone Number: [phone].  Patient calling about refill on insulin.  States she had one refill left until 09/20/12, but states she missed that deadline, and now Rx has expired.  States [facility] tells [facility] have [facility] a refill request to the office, but as she is out of insulin, wanted to know if it could be refilled today.  Denies current/emergent symptoms.  Per Epic, had visit CPX in June 2013 and was told to return to office in October 2013 due to her A1C being high.  Appointment scheduled 09/26/12 0900 with Dr. Alwyn Ren.   Info to office for provider Rx/callback. Uses [facility] [phone].

## 2012-09-26 ENCOUNTER — Telehealth: Payer: Self-pay | Admitting: Internal Medicine

## 2012-09-26 ENCOUNTER — Ambulatory Visit (INDEPENDENT_AMBULATORY_CARE_PROVIDER_SITE_OTHER): Payer: Medicare Other | Admitting: Internal Medicine

## 2012-09-26 ENCOUNTER — Encounter: Payer: Self-pay | Admitting: Internal Medicine

## 2012-09-26 VITALS — BP 138/86 | HR 58 | Temp 97.6°F | Wt 154.8 lb

## 2012-09-26 DIAGNOSIS — T887XXA Unspecified adverse effect of drug or medicament, initial encounter: Secondary | ICD-10-CM

## 2012-09-26 DIAGNOSIS — Z23 Encounter for immunization: Secondary | ICD-10-CM

## 2012-09-26 DIAGNOSIS — IMO0001 Reserved for inherently not codable concepts without codable children: Secondary | ICD-10-CM

## 2012-09-26 DIAGNOSIS — M542 Cervicalgia: Secondary | ICD-10-CM

## 2012-09-26 DIAGNOSIS — E0590 Thyrotoxicosis, unspecified without thyrotoxic crisis or storm: Secondary | ICD-10-CM

## 2012-09-26 LAB — CBC WITH DIFFERENTIAL/PLATELET
Basophils Absolute: 0 10*3/uL (ref 0.0–0.1)
Basophils Relative: 0.5 % (ref 0.0–3.0)
Eosinophils Absolute: 0.3 10*3/uL (ref 0.0–0.7)
Lymphocytes Relative: 23.2 % (ref 12.0–46.0)
MCHC: 31.8 g/dL (ref 30.0–36.0)
MCV: 88.3 fl (ref 78.0–100.0)
Monocytes Absolute: 0.6 10*3/uL (ref 0.1–1.0)
Neutrophils Relative %: 58.3 % (ref 43.0–77.0)
Platelets: 180 10*3/uL (ref 150.0–400.0)
RBC: 4.63 Mil/uL (ref 3.87–5.11)
RDW: 13.4 % (ref 11.5–14.6)

## 2012-09-26 LAB — MICROALBUMIN / CREATININE URINE RATIO
Creatinine,U: 44.4 mg/dL
Microalb, Ur: 4.7 mg/dL — ABNORMAL HIGH (ref 0.0–1.9)

## 2012-09-26 LAB — HEMOGLOBIN A1C: Hgb A1c MFr Bld: 8.9 % — ABNORMAL HIGH (ref 4.6–6.5)

## 2012-09-26 LAB — TSH: TSH: 4.02 u[IU]/mL (ref 0.35–5.50)

## 2012-09-26 NOTE — Telephone Encounter (Signed)
New problem   Sample of praxada 75 mg.

## 2012-09-26 NOTE — Progress Notes (Signed)
Subjective:    Patient ID: [name], female    DOB: 1931/05/09, 78 y.o.   MRN: [id number]  HPI Diabetes status assessment: Fasting or morning glucose range:  120-140  . Highest glucose 2 hours after any meal:  < 200. Hypoglycemia :  no .                                                                                                    Exercise : 3X/ week for 3 hrs as yoga , water aerobics . Nutrition/diet:  Low fat but on vacation for 3 weeks. Medication compliance : yes. Medication adverse  Effects:  no . Eye exam : 6 mos ; no retinopathy. Foot care : no.  A1c/ urine microalbumin monitor: 9.9% in 6/13       Review of Systems Excess thirst :no;  Excess hunger:  no ;  Excess urination:  no.                                  Lightheadedness with standing:  no. Chest pain:  no ; Palpitations : no ;  Pain in  calves with walking:  no .                                                                                                                               Non healing skin  ulcers or sores,especially over the feet:  no. Numbness or tingling or burning in feet : no .                                                                                                                                             Significant change in  Weight : no. Vision changes :no  .       She describes nonexertional, intermittent left neck tightness. She believes trigger is  stress. She does not have this during exercise and there is no associated nausea or sweating.Heat helps  She sees endocrinologist for hyperthyroidism and is on methimazole. Her most recent TSH was 7.99 on 07/07/12.                     Objective:   Physical Exam Gen.:Appears younger than stated age  ; well-nourished in appearance. Alert, appropriate and cooperative throughout exam.  Eyes: No corneal or conjunctival inflammation noted. Mouth: Oral mucosa and oropharynx reveal no lesions or exudates. Teeth in good repair. Neck:  No deformities, masses, or tenderness noted.  Thyroid normal.Decreased lateral rotation Lungs: Normal respiratory effort; chest expands symmetrically. Lungs are clear to auscultation without rales, wheezes, or increased work of breathing.Decreased BS Heart: Slow, irregular rate . Normal S1 and S2. No gallop, click, or rub. No significant murmur.                                                                             Musculoskeletal/extremities:  No clubbing, cyanosis, edema.DIP OA finger deformities noted.  Nail health  good. Vascular: Carotid, radial artery, dorsalis pedis and  posterior tibial pulses are full and equal. No bruits present. Neurologic: Alert and oriented x3. Deep tendon reflexes symmetrical and normal. No cervical nerve deficit on direct testing         Skin: Intact without suspicious lesions or rashes.  Light touch normal over feet.  Lymph: No cervical, axillary lymphadenopathy present. Psych: Mood and affect are normal. Normally interactive                                                                                         Assessment & Plan:  #1 diabetes; A1c not at goal 05/29/12.  #2 hyperthyroidism;?l TSH status  #3 neck tightness; clinically noncardiac in etiology. Local measures recommended; massage therapy or physical therapy if symptoms persist

## 2012-09-26 NOTE — Patient Instructions (Addendum)
Eat a low-fat diet with lots of fruits and vegetables, up to 7-9 servings per day. Consume less than 30 (preferably ZERO) grams of sugar per day from foods & drinks with High Fructose Corn Syrup (HFCS) sugar as #1,2,3 or # 4 on label.Whole Foods, [employer] & [employer] do not carry products with HFCS. Follow a  low carb nutrition program such as [place] or [employer] Sugar [employer]  to prevent Diabetes progression . White carbohydrates (potatoes, rice, bread, and pasta) have a high spike of sugar and a high load of sugar. For example a  baked potato has a cup of sugar and a  french fry  2 teaspoons of sugar. Yams, wild  rice, whole grained bread &  wheat pasta have been much lower spike and load of  sugar. Portions should be the size of a deck of cards or your palm.  Use an anti-inflammatory cream such as Aspercreme or Zostrix cream twice a day to the left neck as needed. In lieu of this warm moist compresses or  hot water bottle can be used. Do not apply ice. Get a cervical pillow.

## 2012-09-26 NOTE — Telephone Encounter (Signed)
Samples outfront 

## 2012-10-04 ENCOUNTER — Telehealth: Payer: Self-pay | Admitting: Internal Medicine

## 2012-10-04 NOTE — Telephone Encounter (Signed)
Spoke with patient, patient to come in tomorrow at 1 pm, patient with foot concern x 1 week. Patient states " I feel perfectly fine, I just need to figure out what's going on with My feet."

## 2012-10-04 NOTE — Telephone Encounter (Signed)
Caller: Lynea/Patient; Patient Name: [name]; PCP: Marga Melnick; Best Callback Phone Number: [phone]. 10/04/12 - Patient did a lot of walking last Thursday (09/28/12) and got blisters on both of her feet. Drained the blisters on 10/01/12, but did not use antibiotic ointment on them until 10/03/12. Does not have them covered. Both ankles are slightly swollen. Afebrile. All Emergent Signs/Symptoms of Skin Lesions Protocol ruled out except "new signs and symptoms of local infection" (disposition-see in 24 hours). Home Care Advice Given. Advised patient to call back in the morning to schedule an appointment to be seen and evaluated (10/04/12 @4 :50 pm). Patient Agreed. Call Back Parameters Given.

## 2012-10-05 ENCOUNTER — Ambulatory Visit: Payer: Medicare Other | Admitting: Internal Medicine

## 2012-10-09 ENCOUNTER — Ambulatory Visit: Payer: Medicare Other | Admitting: Internal Medicine

## 2012-10-31 ENCOUNTER — Encounter: Payer: Self-pay | Admitting: Endocrinology

## 2012-10-31 ENCOUNTER — Ambulatory Visit (INDEPENDENT_AMBULATORY_CARE_PROVIDER_SITE_OTHER): Payer: Medicare Other | Admitting: Endocrinology

## 2012-10-31 VITALS — BP 124/76 | HR 76 | Temp 97.0°F | Wt 155.0 lb

## 2012-10-31 DIAGNOSIS — E0590 Thyrotoxicosis, unspecified without thyrotoxic crisis or storm: Secondary | ICD-10-CM

## 2012-10-31 NOTE — Progress Notes (Signed)
Subjective:    Patient ID: [name], female    DOB: 04-27-1931, 78 y.o.   MRN: [id number]  HPI Pt returns for f/u of hyperthyroidism, due to multinodular goiter. She can't do i-131 rx, due to the need to maintain normal TFT (atrial fibrillation).  2 1/2 mos ago, tapazole was resumed at 5 mg tiw (m,w,f)  pt states she feels well in general.  Past Medical History  Diagnosis Date  . Hypopotassemia     PMH of  . Anemia     iron defficiency  . Type II or unspecified type diabetes mellitus without mention of complication, uncontrolled     Dr Everardo All  . Muscle pain   . Degenerative joint disease   . Atrial fibrillation or flutter     s/p RFCA 7/08;   s/p DCCV in past;   previously on amiodarone;  amio stopped due to lung toxicity  . CAD (coronary artery disease)     s/p NSTEMI tx with BMS to OM1 3/08;  cath 3/08: pOM 99% tx with PCI, pLAD 20%, ? mod stenosis at the AM  . HLD (hyperlipidemia)   . HTN (hypertension)     essential nos  . Dystrophy, corneal stromal   . Chronic diastolic heart failure     echo 11/11:  EF 55-60%, severe LVH, mod LAE, mild MR, mildly increased PASP  . Allergy     seasonal  . Cataract   . CHF (congestive heart failure)   . Myocardial infarction 2008  . Osteoporosis   . Thyroid disease     hypothyroidism    Past Surgical History  Procedure Date  . Cholecystectomy   . Knee surgery   . Eye surgery     cataract surgery & corneal implant bilaterally  . A flutter ablation     Dr Ladona Ridgel  . Gravid 2 para 2   . Colonoscopy 6/07    2 polyps Dr. Elvina Mattes in [place]  . Bare metal stent 3/08    Dr Ladona Ridgel    History   Social History  . Marital Status: Widowed    Spouse Name: N/A    Number of Children: N/A  . Years of Education: N/A   Occupational History  . Teacher    Social History Main Topics  . Smoking status: Former Smoker    Quit date: 12/27/1968  . Smokeless tobacco: Never Used     Comment: quit 1997  . Alcohol Use: No  . Drug Use: No    . Sexually Active: Not on file   Other Topics Concern  . Not on file   Social History Narrative   Teacher. Widowed. Rarely drinks cafeine.     Current Outpatient Prescriptions on File Prior to Visit  Medication Sig Dispense Refill  . B-D ULTRAFINE III SHORT PEN 31G X 8 MM MISC USE AS DIRECTED  100 each  3  . Calcium Carbonate-Vitamin D (CALCIUM 600 + D PO) Take by mouth daily.        . carvedilol (COREG) 25 MG tablet Take two twice a day with food  120 tablet  6  . Cholecalciferol (VITAMIN D3) 1000 UNITS CAPS Take by mouth daily.        . CRESTOR 20 MG tablet TAKE 1 TABLET EVERY DAY  90 tablet  2  . dabigatran (PRADAXA) 75 MG CAPS Take 1 capsule (75 mg total) by mouth every 12 (twelve) hours.  180 capsule  3  . digoxin (LANOXIN) 0.125 MG tablet Take  0.5 tablets (0.0625 mg total) by mouth daily.  30 tablet  9  . folic acid (FOLVITE) 400 MCG tablet Take 400 mcg by mouth daily.        . furosemide (LASIX) 40 MG tablet TAKE 1 TABLET (40 MG TOTAL) BY MOUTH DAILY.  90 tablet  3  . insulin glargine (LANTUS) 100 UNIT/ML injection Inject 22 Units into the skin daily.  10 mL  1  . Lancets (FREESTYLE) lancets Check Blood sugar daily as directed  100 each  3  . LANTUS SOLOSTAR 100 UNIT/ML injection INJECT 24 UNITS INTO THE SKIN DAILY.  15 Syringe  1  . metFORMIN (GLUCOPHAGE-XR) 500 MG 24 hr tablet TAKE 1 TABLET BY MOUTH IN THE MORNING AND 2 TABLETS IN THE EVENING  270 tablet  0  . methimazole (TAPAZOLE) 5 MG tablet Take 1 tablet (5 mg total) by mouth 3 (three) times a week. MWF  40 tablet  2  . nitroGLYCERIN (NITROSTAT) 0.4 MG SL tablet Place 0.4 mg under the tongue every 5 (five) minutes as needed.        . OMEGA 3 1000 MG CAPS Take by mouth.        . potassium chloride SA (KLOR-CON M20) 20 MEQ tablet Take 1 tablet (20 mEq total) by mouth daily.  90 tablet  6  . tiotropium (SPIRIVA HANDIHALER) 18 MCG inhalation capsule INHALE CONTENTS OF 1 CAPSULE EVERY DAY  1 capsule  1  . vitamin B-12  (CYANOCOBALAMIN) 1000 MCG tablet Take 1,000 mcg by mouth daily.          Allergies  Allergen Reactions  . Amlodipine Besylate     REACTION: tingling in lips & gum edema  . Lobster (Shellfish Allergy)     angioedema  . Penicillins     REACTION: rash  . Valsartan     REACTION: angioedema  . Codeine     Mental status changes  . Tramadol Hcl     REACTION: nausea    Family History  Problem Relation Age of Onset  . Diabetes Mother   . Hypertension Mother   . Transient ischemic attack Mother   . Heart attack Father 61  . Arthritis Father   . Breast cancer Maternal Aunt   . Arthritis Maternal Aunt   . Arthritis Mother   . Hypertension Father     BP 124/76  Pulse 76  Temp 97 F (36.1 C) (Oral)  Wt 155 lb (70.308 kg)  SpO2 97%  Review of Systems Denies fever    Objective:   Physical Exam VITAL SIGNS:  See vs page GENERAL: no distress NECK: There is no palpable thyroid enlargement.  No thyroid nodule is palpable.  No palpable lymphadenopathy at the anterior neck.  Lab Results  Component Value Date   TSH 4.02 09/26/2012      Assessment & Plan:  Hyperthyroidism, well-controlled

## 2012-10-31 NOTE — Patient Instructions (Addendum)
if ever you have fever while taking methimazole, stop it and call us, because of the risk of a rare side-effect.  Please come back for a follow-up appointment in 3 months.

## 2012-11-06 ENCOUNTER — Telehealth: Payer: Self-pay | Admitting: Internal Medicine

## 2012-11-06 NOTE — Telephone Encounter (Signed)
New Problem:    Patient called in wanting to know if there were any samples of dabigatran (PRADAXA) 75 MG CAPS available for her to have.  Please call back.

## 2012-11-06 NOTE — Telephone Encounter (Signed)
Patient called to see if we have any samples of Spiriva. The patient states she is going through a hard time right now and would really appreciate it. Call at home if/when ready for pick up.

## 2012-11-06 NOTE — Telephone Encounter (Signed)
I spoke with patient, patient aware samples at the front for pick-up

## 2012-11-06 NOTE — Telephone Encounter (Signed)
Samples of pradaxa 75mg  left at front desk for pt to pick up. LMTCB for pt to let her know samples at front desk.

## 2012-11-08 ENCOUNTER — Other Ambulatory Visit: Payer: Self-pay | Admitting: Internal Medicine

## 2012-12-01 ENCOUNTER — Telehealth: Payer: Self-pay | Admitting: Internal Medicine

## 2012-12-01 NOTE — Telephone Encounter (Signed)
Samples to be given to daughter

## 2012-12-01 NOTE — Telephone Encounter (Signed)
Patient called stating her daughter, Raynelle Fanning, is coming this morning to pick up samples for herself and pt would like to know if we can give her a couple samples of Spiriva and Crestor. Patient states these rx are $70 each and it is hard to pay for them when she is out of both at the same time. Please advise. Pt daughter will be here shortly.

## 2012-12-07 ENCOUNTER — Telehealth: Payer: Self-pay | Admitting: Internal Medicine

## 2012-12-07 NOTE — Telephone Encounter (Signed)
Will get some samples and put out front for patient

## 2012-12-07 NOTE — Telephone Encounter (Signed)
plz return call to pt regarding medication discussion.

## 2013-01-29 ENCOUNTER — Telehealth: Payer: Self-pay | Admitting: Internal Medicine

## 2013-01-29 NOTE — Telephone Encounter (Signed)
Called pts hp# lmom to call back to scheduled CPE after 6.3.14, if dr.ellison is monitoring her DM

## 2013-01-29 NOTE — Telephone Encounter (Signed)
Next CPX due 05/29/2013 or after Next lab visit due 01/2013 (NOW), A1c 250.00, MD will indicate on labs if patient needs follow-up prior to CPX

## 2013-01-29 NOTE — Telephone Encounter (Signed)
Patient wants to know when she needs to be seen next? I looked at last OV and could not see when she needs to return. Thanks!

## 2013-01-31 ENCOUNTER — Ambulatory Visit: Payer: Medicare Other | Admitting: Endocrinology

## 2013-02-01 ENCOUNTER — Other Ambulatory Visit (INDEPENDENT_AMBULATORY_CARE_PROVIDER_SITE_OTHER): Payer: Medicare Other

## 2013-02-01 DIAGNOSIS — E119 Type 2 diabetes mellitus without complications: Secondary | ICD-10-CM

## 2013-02-06 ENCOUNTER — Other Ambulatory Visit: Payer: Self-pay | Admitting: Internal Medicine

## 2013-02-06 ENCOUNTER — Ambulatory Visit (INDEPENDENT_AMBULATORY_CARE_PROVIDER_SITE_OTHER): Payer: Medicare Other | Admitting: Endocrinology

## 2013-02-06 VITALS — BP 122/70 | HR 80 | Temp 97.8°F | Wt 153.0 lb

## 2013-02-06 DIAGNOSIS — E042 Nontoxic multinodular goiter: Secondary | ICD-10-CM

## 2013-02-06 NOTE — Progress Notes (Signed)
Patient ID: [name], female   DOB: January 01, 1931, 79 y.o.   MRN: [id number]  Subjective:    Patient ID: [name], female    DOB: March 21, 1931, 79 y.o.   MRN: [id number]  HPI Pt returns for f/u of hyperthyroidism, due to multinodular goiter. She can't do i-131 rx, due to the need to maintain normal TFT (atrial fibrillation).  In mid-2013, tapazole was resumed at 5 mg tiw (m,w,f).  pt states she feels well in general.  Past Medical History  Diagnosis Date  . Hypopotassemia     PMH of  . Anemia     iron defficiency  . Type II or unspecified type diabetes mellitus without mention of complication, uncontrolled     Dr Everardo All  . Muscle pain   . Degenerative joint disease   . Atrial fibrillation or flutter     s/p RFCA 7/08;   s/p DCCV in past;   previously on amiodarone;  amio stopped due to lung toxicity  . CAD (coronary artery disease)     s/p NSTEMI tx with BMS to OM1 3/08;  cath 3/08: pOM 99% tx with PCI, pLAD 20%, ? mod stenosis at the AM  . HLD (hyperlipidemia)   . HTN (hypertension)     essential nos  . Dystrophy, corneal stromal   . Chronic diastolic heart failure     echo 11/11:  EF 55-60%, severe LVH, mod LAE, mild MR, mildly increased PASP  . Allergy     seasonal  . Cataract   . CHF (congestive heart failure)   . Myocardial infarction 2008  . Osteoporosis   . Thyroid disease     hypothyroidism    Past Surgical History  Procedure Date  . Cholecystectomy   . Knee surgery   . Eye surgery     cataract surgery & corneal implant bilaterally  . A flutter ablation     Dr Ladona Ridgel  . Gravid 2 para 2   . Colonoscopy 6/07    2 polyps Dr. Elvina Mattes in [place]  . Bare metal stent 3/08    Dr Ladona Ridgel    History   Social History  . Marital Status: Widowed    Spouse Name: N/A    Number of Children: N/A  . Years of Education: N/A   Occupational History  . Teacher    Social History Main Topics  . Smoking status: Former Smoker    Quit date: 12/27/1968  . Smokeless  tobacco: Never Used     Comment: quit 1997  . Alcohol Use: No  . Drug Use: No  . Sexually Active: Not on file   Other Topics Concern  . Not on file   Social History Narrative   Teacher. Widowed. Rarely drinks cafeine.     Current Outpatient Prescriptions on File Prior to Visit  Medication Sig Dispense Refill  . B-D ULTRAFINE III SHORT PEN 31G X 8 MM MISC USE AS DIRECTED  100 each  3  . Calcium Carbonate-Vitamin D (CALCIUM 600 + D PO) Take by mouth daily.        . carvedilol (COREG) 25 MG tablet Take two twice a day with food  120 tablet  6  . Cholecalciferol (VITAMIN D3) 1000 UNITS CAPS Take by mouth daily.        . CRESTOR 20 MG tablet TAKE 1 TABLET EVERY DAY  90 tablet  2  . dabigatran (PRADAXA) 75 MG CAPS Take 1 capsule (75 mg total) by mouth every 12 (twelve)  hours.  180 capsule  3  . digoxin (LANOXIN) 0.125 MG tablet Take 0.5 tablets (0.0625 mg total) by mouth daily.  30 tablet  9  . folic acid (FOLVITE) 400 MCG tablet Take 400 mcg by mouth daily.        . furosemide (LASIX) 40 MG tablet TAKE 1 TABLET (40 MG TOTAL) BY MOUTH DAILY.  90 tablet  3  . insulin glargine (LANTUS) 100 UNIT/ML injection Inject 22 Units into the skin daily.  10 mL  1  . Lancets (FREESTYLE) lancets Check Blood sugar daily as directed  100 each  3  . LANTUS SOLOSTAR 100 UNIT/ML injection INJECT 24 UNITS INTO THE SKIN DAILY.  15 Syringe  1  . metFORMIN (GLUCOPHAGE-XR) 500 MG 24 hr tablet TAKE 1 TABLET BY MOUTH IN THE MORNING AND 2 TABLETS IN THE EVENING  270 tablet  0  . methimazole (TAPAZOLE) 5 MG tablet Take 1 tablet (5 mg total) by mouth 3 (three) times a week. MWF  40 tablet  2  . nitroGLYCERIN (NITROSTAT) 0.4 MG SL tablet Place 0.4 mg under the tongue every 5 (five) minutes as needed.        . OMEGA 3 1000 MG CAPS Take by mouth.        . potassium chloride SA (KLOR-CON M20) 20 MEQ tablet Take 1 tablet (20 mEq total) by mouth daily.  90 tablet  6  . tiotropium (SPIRIVA HANDIHALER) 18 MCG inhalation  capsule INHALE CONTENTS OF 1 CAPSULE EVERY DAY  1 capsule  1  . vitamin B-12 (CYANOCOBALAMIN) 1000 MCG tablet Take 1,000 mcg by mouth daily.          Allergies  Allergen Reactions  . Amlodipine Besylate     REACTION: tingling in lips & gum edema  . Lobster (Shellfish Allergy)     angioedema  . Penicillins     REACTION: rash  . Valsartan     REACTION: angioedema  . Codeine     Mental status changes  . Tramadol Hcl     REACTION: nausea    Family History  Problem Relation Age of Onset  . Diabetes Mother   . Hypertension Mother   . Transient ischemic attack Mother   . Heart attack Father 51  . Arthritis Father   . Breast cancer Maternal Aunt   . Arthritis Maternal Aunt   . Arthritis Mother   . Hypertension Father     BP 124/76  Pulse 76  Temp 97 F (36.1 C) (Oral)  Wt 155 lb (70.308 kg)  SpO2 97%  Review of Systems Denies fever    Objective:   Physical Exam VITAL SIGNS:  See vs page GENERAL: no distress NECK: There is no palpable thyroid enlargement.  No thyroid nodule is palpable.  No palpable lymphadenopathy at the anterior neck.    Assessment & Plan:  Hyperthyroidism, on rx.

## 2013-02-06 NOTE — Patient Instructions (Addendum)
if ever you have fever while taking methimazole, stop it and call us, because of the risk of a rare side-effect.  Please come back for a follow-up appointment in 4 months.  blood tests are being requested for you today.  We'll contact you with results.

## 2013-02-07 ENCOUNTER — Other Ambulatory Visit: Payer: Self-pay | Admitting: Internal Medicine

## 2013-02-15 ENCOUNTER — Ambulatory Visit (INDEPENDENT_AMBULATORY_CARE_PROVIDER_SITE_OTHER): Payer: Medicare Other | Admitting: Internal Medicine

## 2013-02-15 ENCOUNTER — Encounter: Payer: Self-pay | Admitting: Internal Medicine

## 2013-02-15 VITALS — BP 136/82 | HR 71 | Wt 151.8 lb

## 2013-02-15 NOTE — Assessment & Plan Note (Signed)
MAJOR changes in nutrition; her fasting blood sugars have dropped dramatically. No changes will be made in her medications. A1c and urine microalbumin will be checked in 11 weeks.  Most important goal is no hypoglycemia. Ultimate A1c goal will be 7.5-8% as long as she is not having any hypoglycemia.

## 2013-02-15 NOTE — Patient Instructions (Addendum)
Please  schedule fasting Labs in 11 weeks ;continue present  nutrition program: BMET,urine microalbumin, A1c. PLEASE BRING THESE INSTRUCTIONS TO FOLLOW UP  LAB APPOINTMENT.This will guarantee correct labs are drawn, eliminating need for repeat blood sampling ( needle sticks ! ). Diagnoses /Codes: 250.02

## 2013-02-15 NOTE — Progress Notes (Signed)
Subjective:    Patient ID: [name], female    DOB: 09/14/1931, 79 y.o.   MRN: [id number]  HPI Diabetes status assessment: Fasting or morning glucose range average is 94-153 over the last week . Highest glucose 2 hours after any meal is 187 on 02/14/13. No hypoglycemia reported                                                                                                                 Regular exercise described as  3 times per week for water aerobics and yoga. New Sugar Busters followed x 10 days; previously no carb or sugar restriction whatsoever Medication compliance is good. No medication adverse effects noted. Eye exam current; no retinopathy  Tingling in both feet which is resolving with improved glucoses ; persistent symptoms right little finger.                                                                                                                         Lightheadedness with sitting, standing and after exercise reported                      A1c 10.9%  12/27/12 (average sugar > 250 with long-term 120%  risk ) Tingling in both feet which is resolving and right little finger.                                                                                                                                             5 to 6 lb. Intentional weight loss.     Review of Systems No excess thirst ;  decreased hunger; or excess urination reported                               No chest pain ; palpitations.  R  calf pain intermittently.                                                                                                                             No non healing skin  ulcers or sores of extremities noted. No blurred,double, or loss of vision reported.                                                                                                                                                  Objective:   Physical Exam Gen.: Healthy  & well-nourished,  appropriate and alert, weight excellent.Appears much younger than stated age Eyes: No lid/conjunctival changes Neck: Normal range of motion, thyroid normal Respiratory: No increased work of breathing or abnormal breath sounds Cardiac : Slow irregular rhythm;no extra heart sounds, gallop or murmur Abdomen: No organomegaly ,masses, bruits or aortic enlargement Lymph: No lymphadenopathy of the neck or axilla Skin: No rashes, lesions, ulcers or ischemic changes Muscle skeletal: no nail changes Vasc:All pulses intact, no bruits present Neuro: Normal deep tendon reflexes, alert & oriented, sensation over feet intact Psych: judgment and insight, mood and affect normal        Assessment & Plan:

## 2013-03-20 ENCOUNTER — Other Ambulatory Visit: Payer: Self-pay | Admitting: Internal Medicine

## 2013-03-31 ENCOUNTER — Other Ambulatory Visit: Payer: Self-pay | Admitting: Internal Medicine

## 2013-04-11 ENCOUNTER — Telehealth: Payer: Self-pay | Admitting: Internal Medicine

## 2013-04-11 NOTE — Telephone Encounter (Signed)
Samples crestor and spiriva wanted and Daughter will be here at 8:30 pt was hoping that she could bring her some samples home with her

## 2013-04-11 NOTE — Telephone Encounter (Signed)
Samples were given to patient's daughter

## 2013-04-12 ENCOUNTER — Other Ambulatory Visit: Payer: Self-pay | Admitting: *Deleted

## 2013-04-12 MED ORDER — GLUCOSE BLOOD VI STRP: ORAL_STRIP | Status: DC

## 2013-04-12 NOTE — Telephone Encounter (Signed)
Rx sent 

## 2013-04-13 ENCOUNTER — Other Ambulatory Visit: Payer: Self-pay | Admitting: Internal Medicine

## 2013-04-19 ENCOUNTER — Telehealth: Payer: Self-pay | Admitting: Internal Medicine

## 2013-04-19 MED ORDER — INSULIN GLARGINE 100 UNIT/ML ~~LOC~~ SOLN: SUBCUTANEOUS | Status: DC

## 2013-04-19 NOTE — Telephone Encounter (Signed)
*  New Pharmacy: [facility] [address]* Refill: Lantus solostar 15 ml. 22 units into skin daily.

## 2013-05-15 ENCOUNTER — Other Ambulatory Visit (INDEPENDENT_AMBULATORY_CARE_PROVIDER_SITE_OTHER): Payer: BC Managed Care – PPO

## 2013-05-15 DIAGNOSIS — IMO0001 Reserved for inherently not codable concepts without codable children: Secondary | ICD-10-CM

## 2013-05-15 LAB — BASIC METABOLIC PANEL
GFR: 71.15 mL/min (ref >60.00)
Glucose, Bld: 134 mg/dL — ABNORMAL HIGH (ref 70–99)
Potassium: 4 mEq/L (ref 3.5–5.1)
Sodium: 138 mEq/L (ref 135–145)

## 2013-05-18 ENCOUNTER — Encounter: Payer: Self-pay | Admitting: Internal Medicine

## 2013-05-18 ENCOUNTER — Ambulatory Visit (INDEPENDENT_AMBULATORY_CARE_PROVIDER_SITE_OTHER): Payer: BC Managed Care – PPO | Admitting: Internal Medicine

## 2013-05-18 VITALS — BP 153/56 | HR 74 | Ht 63.0 in | Wt 148.8 lb

## 2013-05-18 DIAGNOSIS — I4891 Unspecified atrial fibrillation: Secondary | ICD-10-CM

## 2013-05-18 DIAGNOSIS — I509 Heart failure, unspecified: Secondary | ICD-10-CM

## 2013-05-18 NOTE — Assessment & Plan Note (Signed)
Today she is in atrial flutter, originating from the left atrium. Previously she has been in atrial fibrillation. Her ventricular rate is well controlled.

## 2013-05-18 NOTE — Assessment & Plan Note (Signed)
She has a history of tachycardia induced cardiomyopathy and chronic systolic heart failure. I strongly suspect her ejection fraction has normalized. Her heart failure symptoms are now class I. She will continue her current medical therapy, maintain a low-sodium diet. Should she experience shortness of breath, she is instructed to weigh herself, and if her weight is elevated, take an additional dose of Lasix.

## 2013-05-18 NOTE — Progress Notes (Signed)
HPI [name] returns today for followup. She is a very pleasant 79 year old woman with a history of chronic atrial fibrillation, a tachycardia induced cardiomyopathy which has now resolved, chronic systolic heart failure, now resolved, diabetes, and a history of obesity. In the interim, the patient is much improved. We have not seen her for a year. She denies chest pain or shortness of breath and is exercising approximately 10 hours a week. No peripheral edema. She is lost approximately 20 pounds by exercising and trying to eat a little bit less. Allergies  Allergen Reactions  . Amlodipine Besylate     REACTION: tingling in lips & gum edema  . Lobster (Shellfish Allergy)     angioedema  . Penicillins     REACTION: rash  . Valsartan     REACTION: angioedema  . Codeine     Mental status changes  . Tramadol Hcl     REACTION: nausea     Current Outpatient Prescriptions  Medication Sig Dispense Refill  . B-D ULTRAFINE III SHORT PEN 31G X 8 MM MISC USE AS DIRECTED  100 each  3  . Calcium-Magnesium-Zinc (CAL-MAG-ZINC PO) Take by mouth daily.      . carvedilol (COREG) 25 MG tablet Take two twice a day with food  120 tablet  6  . Cholecalciferol (VITAMIN D3) 1000 UNITS CAPS Take by mouth daily.        . CRESTOR 20 MG tablet TAKE 1 TABLET EVERY DAY  90 tablet  2  . dabigatran (PRADAXA) 75 MG CAPS Take 1 capsule (75 mg total) by mouth every 12 (twelve) hours.  180 capsule  3  . digoxin (LANOXIN) 0.125 MG tablet Take 0.5 tablets (0.0625 mg total) by mouth daily.  30 tablet  9  . folic acid (FOLVITE) 800 MCG tablet Take 800 mcg by mouth daily.      . furosemide (LASIX) 40 MG tablet TAKE 1 TABLET (40 MG TOTAL) BY MOUTH DAILY.  90 tablet  3  . glucose blood (FREESTYLE LITE) test strip DX 250.02, Check blood sugar once daily as directed  100 each  3  . glucose blood test strip Use as instructed  100 each  2  . Lancets (FREESTYLE) lancets Check Blood sugar daily as directed  100 each  3  . LANTUS  SOLOSTAR 100 UNIT/ML injection INJECT 22 UNITS INTO THE SKIN DAILY.  3 mL  0  . metFORMIN (GLUCOPHAGE-XR) 500 MG 24 hr tablet TAKE 1 TABLET BY MOUTH IN THE MORNING AND 2 TABLETS IN THE EVENING  270 tablet  0  . methimazole (TAPAZOLE) 5 MG tablet Take 1 tablet (5 mg total) by mouth 3 (three) times a week. MWF  40 tablet  2  . nitroGLYCERIN (NITROSTAT) 0.4 MG SL tablet Place 0.4 mg under the tongue every 5 (five) minutes as needed.        . Omega-3 Fatty Acids (FISH OIL) 1200 MG CAPS Take by mouth daily.      . potassium chloride SA (KLOR-CON M20) 20 MEQ tablet Take 1 tablet (20 mEq total) by mouth daily.  90 tablet  6  . tiotropium (SPIRIVA HANDIHALER) 18 MCG inhalation capsule INHALE CONTENTS OF 1 CAPSULE EVERY DAY  1 capsule  1  . vitamin B-12 (CYANOCOBALAMIN) 1000 MCG tablet Take 1,000 mcg by mouth daily.        . insulin glargine (LANTUS SOLOSTAR) 100 UNIT/ML injection INJECT 24 UNITS INTO THE SKIN DAILY. LABS DUE IN MAY 2014  3 mL  1  . SPIRIVA HANDIHALER 18 MCG inhalation capsule INHALE 1 CAPSULE VIA HANDIHALER ONCE DAILY AT THE SAME TIME EVERY DAY  90 capsule  2   No current facility-administered medications for this visit.     Past Medical History  Diagnosis Date  . Hypopotassemia     PMH of  . Anemia     iron defficiency  . Type II or unspecified type diabetes mellitus without mention of complication, uncontrolled     Dr Everardo All  . Muscle pain   . Degenerative joint disease   . Atrial fibrillation or flutter     s/p RFCA 7/08;   s/p DCCV in past;   previously on amiodarone;  amio stopped due to lung toxicity  . CAD (coronary artery disease)     s/p NSTEMI tx with BMS to OM1 3/08;  cath 3/08: pOM 99% tx with PCI, pLAD 20%, ? mod stenosis at the AM  . HLD (hyperlipidemia)   . HTN (hypertension)     essential nos  . Dystrophy, corneal stromal   . Chronic diastolic heart failure     echo 11/11:  EF 55-60%, severe LVH, mod LAE, mild MR, mildly increased PASP  . Allergy      seasonal  . Cataract   . CHF (congestive heart failure)   . Myocardial infarction 2008  . Osteoporosis   . Thyroid disease     hypothyroidism    ROS:   All systems reviewed and negative except as noted in the HPI.   Past Surgical History  Procedure Laterality Date  . Cholecystectomy    . Knee surgery    . Eye surgery      cataract surgery & corneal implant bilaterally  . A flutter ablation      Dr Ladona Ridgel  . Gravid 2 para 2    . Colonoscopy  6/07    2 polyps Dr. Elvina Mattes in [place]  . Bare metal stent  3/08    Dr Ladona Ridgel     Family History  Problem Relation Age of Onset  . Diabetes Mother   . Hypertension Mother   . Transient ischemic attack Mother   . Heart attack Father 26  . Arthritis Father   . Breast cancer Maternal Aunt   . Arthritis Maternal Aunt   . Arthritis Mother   . Hypertension Father      History   Social History  . Marital Status: Widowed    Spouse Name: N/A    Number of Children: N/A  . Years of Education: N/A   Occupational History  . Teacher    Social History Main Topics  . Smoking status: Former Smoker    Quit date: 12/27/1968  . Smokeless tobacco: Never Used     Comment: quit 1997  . Alcohol Use: No  . Drug Use: No  . Sexually Active: Not on file   Other Topics Concern  . Not on file   Social History Narrative   Teacher. Widowed. Rarely drinks cafeine.      BP 153/56  Pulse 74  Ht 5\' 3"  (1.6 m)  Wt 148 lb 12.8 oz (67.495 kg)  BMI 26.37 kg/m2  Physical Exam:  Well appearing 79 year old woman, who looks younger than her stated age, NAD HEENT: Unremarkable Neck:  7 cm JVD, no thyromegally Back:  No CVA tenderness Lungs:  Clear with no wheezes, rales, or rhonchi. No increased work of breathing. HEART:  Regular rate rhythm, no murmurs, no rubs, no clicks Abd:  soft, positive bowel sounds, no organomegally, no rebound, no guarding Ext:  2 plus pulses, no edema, no cyanosis, no clubbing Skin:  No rashes no nodules Neuro:  CN  II through XII intact, motor grossly intact  EKG - atrial flutter with a controlled ventricular response. Atypical.    Assess/Plan:

## 2013-05-18 NOTE — Patient Instructions (Addendum)
Your physician wants you to follow-up in: 12 months with Dr. Taylor. You will receive a reminder letter in the mail two months in advance. If you don't receive a letter, please call our office to schedule the follow-up appointment.    

## 2013-05-24 ENCOUNTER — Ambulatory Visit (INDEPENDENT_AMBULATORY_CARE_PROVIDER_SITE_OTHER): Payer: BC Managed Care – PPO | Admitting: Endocrinology

## 2013-05-24 ENCOUNTER — Encounter: Payer: Self-pay | Admitting: Endocrinology

## 2013-05-24 VITALS — BP 124/70 | HR 75 | Ht 65.0 in | Wt 148.0 lb

## 2013-05-24 MED ORDER — INSULIN LISPRO 100 UNIT/ML (KWIKPEN): 5.0000 [IU] | PEN_INJECTOR | Freq: Three times a day (TID) | SUBCUTANEOUS | Status: DC

## 2013-05-24 NOTE — Progress Notes (Signed)
Subjective:    Patient ID: [name], female    DOB: 05-23-1931, 79 y.o.   MRN: [id number]  HPI pt states 20 years h/o dm, complicated by CAD.  pt says her diet and exercise are good.  Pt reports few mos of slight numbness of the feet, but no assoc pain.   she brings a record of her cbg's which i have reviewed today.  She checks in am only.  It varies from 99-180, but most are in the low-100's.   Past Medical History  Diagnosis Date  . Hypopotassemia     PMH of  . Anemia     iron defficiency  . Type II or unspecified type diabetes mellitus without mention of complication, uncontrolled     Dr Everardo All  . Muscle pain   . Degenerative joint disease   . Atrial fibrillation or flutter     s/p RFCA 7/08;   s/p DCCV in past;   previously on amiodarone;  amio stopped due to lung toxicity  . CAD (coronary artery disease)     s/p NSTEMI tx with BMS to OM1 3/08;  cath 3/08: pOM 99% tx with PCI, pLAD 20%, ? mod stenosis at the AM  . HLD (hyperlipidemia)   . HTN (hypertension)     essential nos  . Dystrophy, corneal stromal   . Chronic diastolic heart failure     echo 11/11:  EF 55-60%, severe LVH, mod LAE, mild MR, mildly increased PASP  . Allergy     seasonal  . Cataract   . CHF (congestive heart failure)   . Myocardial infarction 2008  . Osteoporosis   . Thyroid disease     hypothyroidism    Past Surgical History  Procedure Laterality Date  . Cholecystectomy    . Knee surgery    . Eye surgery      cataract surgery & corneal implant bilaterally  . A flutter ablation      Dr Ladona Ridgel  . Gravid 2 para 2    . Colonoscopy  6/07    2 polyps Dr. Elvina Mattes in [place]  . Bare metal stent  3/08    Dr Ladona Ridgel    History   Social History  . Marital Status: Widowed    Spouse Name: N/A    Number of Children: N/A  . Years of Education: N/A   Occupational History  . Teacher    Social History Main Topics  . Smoking status: Former Smoker    Quit date: 12/27/1968  . Smokeless  tobacco: Never Used     Comment: quit 1997  . Alcohol Use: No  . Drug Use: No  . Sexually Active: Not on file   Other Topics Concern  . Not on file   Social History Narrative   Teacher. Widowed. Rarely drinks cafeine.     Current Outpatient Prescriptions on File Prior to Visit  Medication Sig Dispense Refill  . B-D ULTRAFINE III SHORT PEN 31G X 8 MM MISC USE AS DIRECTED  100 each  3  . Calcium-Magnesium-Zinc (CAL-MAG-ZINC PO) Take by mouth daily.      . carvedilol (COREG) 25 MG tablet Take two twice a day with food  120 tablet  6  . Cholecalciferol (VITAMIN D3) 1000 UNITS CAPS Take by mouth daily.        . CRESTOR 20 MG tablet TAKE 1 TABLET EVERY DAY  90 tablet  2  . dabigatran (PRADAXA) 75 MG CAPS Take 1 capsule (75 mg total)  by mouth every 12 (twelve) hours.  180 capsule  3  . digoxin (LANOXIN) 0.125 MG tablet Take 0.5 tablets (0.0625 mg total) by mouth daily.  30 tablet  9  . folic acid (FOLVITE) 800 MCG tablet Take 800 mcg by mouth daily.      . furosemide (LASIX) 40 MG tablet TAKE 1 TABLET (40 MG TOTAL) BY MOUTH DAILY.  90 tablet  3  . glucose blood (FREESTYLE LITE) test strip DX 250.02, Check blood sugar once daily as directed  100 each  3  . glucose blood test strip Use as instructed  100 each  2  . insulin glargine (LANTUS SOLOSTAR) 100 UNIT/ML injection INJECT 24 UNITS INTO THE SKIN DAILY. LABS DUE IN MAY 2014  3 mL  1  . Lancets (FREESTYLE) lancets Check Blood sugar daily as directed  100 each  3  . LANTUS SOLOSTAR 100 UNIT/ML injection INJECT 22 UNITS INTO THE SKIN DAILY.  3 mL  0  . metFORMIN (GLUCOPHAGE-XR) 500 MG 24 hr tablet TAKE 1 TABLET BY MOUTH IN THE MORNING AND 2 TABLETS IN THE EVENING  270 tablet  0  . methimazole (TAPAZOLE) 5 MG tablet Take 1 tablet (5 mg total) by mouth 3 (three) times a week. MWF  40 tablet  2  . nitroGLYCERIN (NITROSTAT) 0.4 MG SL tablet Place 0.4 mg under the tongue every 5 (five) minutes as needed.        . Omega-3 Fatty Acids (FISH OIL)  1200 MG CAPS Take by mouth daily.      . potassium chloride SA (KLOR-CON M20) 20 MEQ tablet Take 1 tablet (20 mEq total) by mouth daily.  90 tablet  6  . SPIRIVA HANDIHALER 18 MCG inhalation capsule INHALE 1 CAPSULE VIA HANDIHALER ONCE DAILY AT THE SAME TIME EVERY DAY  90 capsule  2  . tiotropium (SPIRIVA HANDIHALER) 18 MCG inhalation capsule INHALE CONTENTS OF 1 CAPSULE EVERY DAY  1 capsule  1  . vitamin B-12 (CYANOCOBALAMIN) 1000 MCG tablet Take 1,000 mcg by mouth daily.         No current facility-administered medications on file prior to visit.    Allergies  Allergen Reactions  . Amlodipine Besylate     REACTION: tingling in lips & gum edema  . Lobster (Shellfish Allergy)     angioedema  . Penicillins     REACTION: rash  . Valsartan     REACTION: angioedema  . Codeine     Mental status changes  . Tramadol Hcl     REACTION: nausea    Family History  Problem Relation Age of Onset  . Diabetes Mother   . Hypertension Mother   . Transient ischemic attack Mother   . Heart attack Father 67  . Arthritis Father   . Breast cancer Maternal Aunt   . Arthritis Maternal Aunt   . Arthritis Mother   . Hypertension Father     BP 124/70  Pulse 75  Ht 5\' 5"  (1.651 m)  Wt 148 lb (67.132 kg)  BMI 24.63 kg/m2  SpO2 96%  Review of Systems denies blurry vision, headache, chest pain, n/v, urinary frequency, memory loss, depression, hypoglycemia, and rhinorrhea.  She has lost weight, due to her efforts.  She has doe, easy bruising, muscle cramps, and excessive diaphoresis.      Objective:   Physical Exam VS: see vs page GEN: no distress HEAD: head: no deformity eyes: no periorbital swelling, no proptosis external nose and ears are normal  mouth: no lesion seen NECK: small multinodular goiter.   CHEST WALL: no deformity LUNGS:  Clear to auscultation CV: reg rate and rhythm, no murmur ABD: abdomen is soft, nontender.  no hepatosplenomegaly.  not distended.  no hernia.    MUSCULOSKELETAL: muscle bulk and strength are grossly normal.  no obvious joint swelling.  gait is normal and steady PULSES: no carotid bruit NEURO:  cn 2-12 grossly intact.   readily moves all 4's.   SKIN:  Normal texture and temperature.  No rash or suspicious lesion is visible.   NODES:  None palpable at the neck PSYCH: alert, oriented x3.  Does not appear anxious nor depressed.  Lab Results  Component Value Date   HGBA1C 9.7* 05/15/2013      Assessment & Plan:  DM: Based on the pattern of her cbg's, she needs some adjustment in her therapy CAD: in view of this, she should avoid hypoglycemia Vitiligo.  This raises the possibility of dx of type 1 DM.

## 2013-05-24 NOTE — Patient Instructions (Addendum)
good diet and exercise habits significanly improve the control of your diabetes.  please let me know if you wish to be referred to a dietician.  high blood sugar is very risky to your health.  you should see an eye doctor every year.  You are at higher than average risk for pneumonia and hepatitis-B.  You should be vaccinated against both.   controlling your blood pressure and cholesterol drastically reduces the damage diabetes does to your body.  this also applies to quitting smoking.  please discuss these with your doctor.  you should take an aspirin every day, unless you have been advised by a doctor not to. check your blood sugar twice a day.  vary the time of day when you check, between before the 3 meals, and at bedtime.  also check if you have symptoms of your blood sugar being too high or too low.  please keep a record of the readings and bring it to your next appointment here.  please call us sooner if your blood sugar goes below 70, or if you have a lot of readings over 200.  Please stop the metformin.   Please add humalog, 5 units 3 times a day (just before each meal), and: Reduce the lantus to 15 units at bedtime.   Please come back for a follow-up appointment in 2 weeks.

## 2013-05-28 ENCOUNTER — Telehealth: Payer: Self-pay | Admitting: Endocrinology

## 2013-05-28 NOTE — Telephone Encounter (Signed)
Please increase humalog to 7 units 3 times a day (just before each meal). Refer to a diatician specialist.  you will receive a phone call, about a day and time for an appointment

## 2013-05-28 NOTE — Telephone Encounter (Signed)
Pt advised.

## 2013-05-28 NOTE — Telephone Encounter (Signed)
Says numbers w/ BS are increased since she started new regime. Wants to discuss this and referral to a dietician. Please call. Avail until 1145 or after 4pm today / Sherri

## 2013-06-07 ENCOUNTER — Ambulatory Visit (INDEPENDENT_AMBULATORY_CARE_PROVIDER_SITE_OTHER): Payer: BC Managed Care – PPO | Admitting: Endocrinology

## 2013-06-07 ENCOUNTER — Encounter: Payer: Self-pay | Admitting: Endocrinology

## 2013-06-07 VITALS — BP 128/78 | HR 78 | Ht 66.0 in | Wt 150.0 lb

## 2013-06-07 NOTE — Progress Notes (Signed)
Subjective:    Patient ID: [name], female    DOB: [birth year], 79 y.o.   MRN: [id number]  HPI pt returns for f/u of insulin-requiring DM (dx'ed 1994; she has mild if any neuropathy of the lower extremities; she has associated CAD.  she brings a record of her cbg's which i have reviewed today.  It varies from 110-200.  There is no trend throughout the day, except it is lower with exertion.   Past Medical History  Diagnosis Date  . Hypopotassemia     PMH of  . Anemia     iron defficiency  . Type II or unspecified type diabetes mellitus without mention of complication, uncontrolled     Dr Everardo All  . Muscle pain   . Degenerative joint disease   . Atrial fibrillation or flutter     s/p RFCA 7/08;   s/p DCCV in past;   previously on amiodarone;  amio stopped due to lung toxicity  . CAD (coronary artery disease)     s/p NSTEMI tx with BMS to OM1 3/08;  cath 3/08: pOM 99% tx with PCI, pLAD 20%, ? mod stenosis at the AM  . HLD (hyperlipidemia)   . HTN (hypertension)     essential nos  . Dystrophy, corneal stromal   . Chronic diastolic heart failure     echo 11/11:  EF 55-60%, severe LVH, mod LAE, mild MR, mildly increased PASP  . Allergy     seasonal  . Cataract   . CHF (congestive heart failure)   . Myocardial infarction 2008  . Osteoporosis   . Thyroid disease     hypothyroidism    Past Surgical History  Procedure Laterality Date  . Cholecystectomy    . Knee surgery    . Eye surgery      cataract surgery & corneal implant bilaterally  . A flutter ablation      Dr Ladona Ridgel  . Gravid 2 para 2    . Colonoscopy  6/07    2 polyps Dr. Elvina Mattes in [place]  . Bare metal stent  3/08    Dr Ladona Ridgel    History   Social History  . Marital Status: Widowed    Spouse Name: N/A    Number of Children: N/A  . Years of Education: N/A   Occupational History  . Teacher    Social History Main Topics  . Smoking status: Former Smoker    Quit date: 12/27/1968  . Smokeless tobacco:  Never Used     Comment: quit 1997  . Alcohol Use: No  . Drug Use: No  . Sexually Active: Not on file   Other Topics Concern  . Not on file   Social History Narrative   Teacher. Widowed. Rarely drinks cafeine.     Current Outpatient Prescriptions on File Prior to Visit  Medication Sig Dispense Refill  . B-D ULTRAFINE III SHORT PEN 31G X 8 MM MISC USE AS DIRECTED  100 each  3  . Calcium-Magnesium-Zinc (CAL-MAG-ZINC PO) Take by mouth daily.      . carvedilol (COREG) 25 MG tablet Take two twice a day with food  120 tablet  6  . Cholecalciferol (VITAMIN D3) 1000 UNITS CAPS Take by mouth daily.        . CRESTOR 20 MG tablet TAKE 1 TABLET EVERY DAY  90 tablet  2  . dabigatran (PRADAXA) 75 MG CAPS Take 1 capsule (75 mg total) by mouth every 12 (twelve) hours.  180 capsule  3  . digoxin (LANOXIN) 0.125 MG tablet Take 0.5 tablets (0.0625 mg total) by mouth daily.  30 tablet  9  . folic acid (FOLVITE) 800 MCG tablet Take 800 mcg by mouth daily.      . furosemide (LASIX) 40 MG tablet TAKE 1 TABLET (40 MG TOTAL) BY MOUTH DAILY.  90 tablet  3  . glucose blood (FREESTYLE LITE) test strip DX 250.02, Check blood sugar once daily as directed  100 each  3  . glucose blood test strip Use as instructed  100 each  2  . insulin glargine (LANTUS SOLOSTAR) 100 UNIT/ML injection INJECT 24 UNITS INTO THE SKIN DAILY. LABS DUE IN MAY 2014  3 mL  1  . Lancets (FREESTYLE) lancets Check Blood sugar daily as directed  100 each  3  . methimazole (TAPAZOLE) 5 MG tablet Take 1 tablet (5 mg total) by mouth 3 (three) times a week. MWF  40 tablet  2  . nitroGLYCERIN (NITROSTAT) 0.4 MG SL tablet Place 0.4 mg under the tongue every 5 (five) minutes as needed.        . Omega-3 Fatty Acids (FISH OIL) 1200 MG CAPS Take by mouth daily.      . potassium chloride SA (KLOR-CON M20) 20 MEQ tablet Take 1 tablet (20 mEq total) by mouth daily.  90 tablet  6  . SPIRIVA HANDIHALER 18 MCG inhalation capsule INHALE 1 CAPSULE VIA HANDIHALER  ONCE DAILY AT THE SAME TIME EVERY DAY  90 capsule  2  . tiotropium (SPIRIVA HANDIHALER) 18 MCG inhalation capsule INHALE CONTENTS OF 1 CAPSULE EVERY DAY  1 capsule  1  . vitamin B-12 (CYANOCOBALAMIN) 1000 MCG tablet Take 1,000 mcg by mouth daily.         No current facility-administered medications on file prior to visit.    Allergies  Allergen Reactions  . Amlodipine Besylate     REACTION: tingling in lips & gum edema  . Lobster (Shellfish Allergy)     angioedema  . Penicillins     REACTION: rash  . Valsartan     REACTION: angioedema  . Codeine     Mental status changes  . Tramadol Hcl     REACTION: nausea    Family History  Problem Relation Age of Onset  . Diabetes Mother   . Hypertension Mother   . Transient ischemic attack Mother   . Heart attack Father 68  . Arthritis Father   . Breast cancer Maternal Aunt   . Arthritis Maternal Aunt   . Arthritis Mother   . Hypertension Father    BP 128/78  Pulse 78  Ht 5\' 6"  (1.676 m)  Wt 150 lb (68.04 kg)  BMI 24.22 kg/m2  SpO2 94%  Review of Systems denies hypoglycemia and weight change.      Objective:   Physical Exam VITAL SIGNS:  See vs page GENERAL: no distress SKIN:  Insulin injection sites at the anterior abdomen are normal.       Assessment & Plan:  DM: This insulin regimen was chosen from multiple options, as it best matches her insulin to her changing requirements throughout the day.  The benefits of glycemic control must be weighed against the risks of hypoglycemia.

## 2013-06-07 NOTE — Patient Instructions (Addendum)
check your blood sugar twice a day.  vary the time of day when you check, between before the 3 meals, and at bedtime.  also check if you have symptoms of your blood sugar being too high or too low.  please keep a record of the readings and bring it to your next appointment here.  please call us sooner if your blood sugar goes below 70, or if you have a lot of readings over 200.  Please increase the humalog to 8 units 3 times a day (just before each meal).   Please come back for a follow-up appointment in 1 month.

## 2013-06-12 ENCOUNTER — Encounter: Payer: BC Managed Care – PPO | Attending: Endocrinology | Admitting: *Deleted

## 2013-06-12 ENCOUNTER — Other Ambulatory Visit: Payer: Self-pay | Admitting: Internal Medicine

## 2013-06-12 VITALS — Ht 63.0 in | Wt 153.7 lb

## 2013-06-12 DIAGNOSIS — Z713 Dietary counseling and surveillance: Secondary | ICD-10-CM | POA: Insufficient documentation

## 2013-06-12 DIAGNOSIS — E119 Type 2 diabetes mellitus without complications: Secondary | ICD-10-CM | POA: Insufficient documentation

## 2013-06-12 NOTE — Telephone Encounter (Signed)
Lipid/Hep 272.4/995.20  

## 2013-06-13 ENCOUNTER — Encounter: Payer: Self-pay | Admitting: *Deleted

## 2013-06-13 DIAGNOSIS — H04229 Epiphora due to insufficient drainage, unspecified lacrimal gland: Secondary | ICD-10-CM | POA: Insufficient documentation

## 2013-06-13 NOTE — Progress Notes (Signed)
Patient was seen on 06/12/2013 for the first of a series of three diabetes self-management courses at the [facility]. Patient's most recent A1c was 9.7 % on 05/15/2013 The following learning objectives were met by the patient during this course:   Defines the role of glucose and insulin  Identifies type of diabetes and pathophysiology  Defines the diagnostic criteria for diabetes and prediabetes  States the risk factors for Type 2 Diabetes  States the symptoms of Type 2 Diabetes  Defines Type 2 Diabetes treatment goals  Defines Type 2 Diabetes treatment options  States the rationale for glucose monitoring  Identifies A1C, glucose targets, and testing times  Identifies proper sharps disposal  Defines the purpose of a diabetes food plan  Identifies carbohydrate food groups  Defines effects of carbohydrate foods on glucose levels  Identifies carbohydrate choices/grams/food labels  States benefits of physical activity and effect on glucose  Review of suggested activity guidelines  Handouts given during class include:  Type 2 Diabetes: Basics Book  My Food Plan Book  Food and Activity Log   Follow-Up Plan: Core Class 2

## 2013-06-13 NOTE — Patient Instructions (Signed)
Goals:  Follow Diabetes Meal Plan as instructed  Eat 3 meals and 2 snacks, every 3-5 hrs  Limit carbohydrate intake to 30-45 grams carbohydrate/meal  Limit carbohydrate intake to 0-15 grams carbohydrate/snack  Add lean protein foods to meals/snacks  Monitor glucose levels as instructed by your doctor  Aim for 30 mins of physical activity daily  Bring food record and glucose log to your next nutrition visit 

## 2013-06-21 ENCOUNTER — Other Ambulatory Visit: Payer: Self-pay | Admitting: *Deleted

## 2013-06-21 ENCOUNTER — Other Ambulatory Visit: Payer: Self-pay | Admitting: Internal Medicine

## 2013-06-21 MED ORDER — METHIMAZOLE 5 MG PO TABS: 5.0000 mg | ORAL_TABLET | ORAL | Status: DC

## 2013-06-25 ENCOUNTER — Telehealth: Payer: Self-pay | Admitting: Internal Medicine

## 2013-06-25 MED ORDER — IPRATROPIUM-ALBUTEROL 20-100 MCG/ACT IN AERS: 1.0000 | INHALATION_SPRAY | Freq: Four times a day (QID) | RESPIRATORY_TRACT | Status: DC

## 2013-06-25 NOTE — Telephone Encounter (Signed)
Left Pt detail VM, Rx sent

## 2013-06-25 NOTE — Telephone Encounter (Signed)
Patient is calling in regards to her Spiriva Rx. States that she has a new insurance and her prescription has went from $40 up to $136. She can no longer afford it and wants to know if Dr. Alwyn Ren can suggest another similar medication.

## 2013-06-25 NOTE — Telephone Encounter (Signed)
Combivent Respimat 1 puff qid ; sample if available

## 2013-06-25 NOTE — Telephone Encounter (Signed)
New Prob    Requesting some samples of PRADAXA. Please call.

## 2013-06-25 NOTE — Telephone Encounter (Signed)
Samples outfront 

## 2013-06-28 ENCOUNTER — Telehealth: Payer: Self-pay | Admitting: *Deleted

## 2013-06-28 NOTE — Telephone Encounter (Signed)
Called patient and discussed about her blood sugars. She told me that she is taking 8 units of Humalog at the beginning of her meals but only 15 units of Lantus at night (not 24, although she was advised to do this at last visit with Dr. Everardo All). [facility] and has lunch there around 1 PM. After exercise, around 4:30 PM yesterday, she dropped her sugars at 73. She also tells me that she can wake up around 4 AM sweating. She does not check her sugars during the night when this happens.  She also had higher sugars in am: 170-200s after stopping Metformin and adding mealtime insulin. Lows at night sometimes can explain the high CBGs in am. I advised the patient to decrease the dose of Humalog to 6 units with the meal that she has before exercise either when [facility] or just going for a walk. Will maintain the Lantus dose at 16 units for now. I advised her to call and let me know if sugars not improved next week.

## 2013-06-28 NOTE — Telephone Encounter (Signed)
Called pt and she told me that her bg has been out of control. Pt is feeling fine. Pt states she is on Humalog 8 units 3 times a day prior to meals and 24 units of Lantus at bedtime. Her bg readings have been: Sunday: 157 am and 208 at night, Monday: 178 am and 201 at night, Tuesday: 174 am and did not take it at night, Wednesday: 203 am and 73 at night, Thursday: 217 am.   Pt states she has breakfast between 8-9 am, lunch between 1-2 pm, and dinner between 6-7 pm. She is taking her last insulin at 11:00 at night.  Please advise in Dr George Hugh absence. Thank you.

## 2013-06-28 NOTE — Telephone Encounter (Signed)
Pt called and lvm stating she has begun a new regiment of Humalog and her bg numbers have been out of control.  Please advise in Dr George Hugh absence. Thank you.

## 2013-07-03 ENCOUNTER — Encounter: Payer: Medicare Other | Attending: Endocrinology | Admitting: *Deleted

## 2013-07-03 DIAGNOSIS — Z713 Dietary counseling and surveillance: Secondary | ICD-10-CM | POA: Insufficient documentation

## 2013-07-03 DIAGNOSIS — E119 Type 2 diabetes mellitus without complications: Secondary | ICD-10-CM | POA: Insufficient documentation

## 2013-07-04 NOTE — Progress Notes (Signed)
  Patient was seen on 07/03/13 for the second of a series of three diabetes self-management courses at the [facility]. The following learning objectives were met by the patient during this course:   Explain basic nutrition maintenance and quality assurance  Describe causes, symptoms and treatment of hypoglycemia and hyperglycemia  Explain how to manage diabetes during illness  Describe the importance of good nutrition for health and healthy eating strategies  List strategies to follow meal plan when dining out  Describe the effects of alcohol on glucose and how to use it safely  Describe problem solving skills for day-to-day glucose challenges  Describe strategies to use when treatment plan needs to change  Identify important factors involved in successful weight loss  Describe ways to remain physically active  Describe the impact of regular activity on insulin resistance   Handouts given in class:  Refrigerator magnet for Sick Day Guidelines  [facility] Oral medication/insulin handout  Follow-Up Plan: Patient will attend the final class of the ADA Diabetes Self-Care Education.    

## 2013-07-10 ENCOUNTER — Ambulatory Visit (INDEPENDENT_AMBULATORY_CARE_PROVIDER_SITE_OTHER): Payer: BC Managed Care – PPO | Admitting: Endocrinology

## 2013-07-10 ENCOUNTER — Encounter: Payer: Self-pay | Admitting: Endocrinology

## 2013-07-10 VITALS — BP 138/80 | HR 57 | Temp 97.6°F | Resp 14 | Ht 63.0 in | Wt 154.1 lb

## 2013-07-10 MED ORDER — INSULIN LISPRO PROT & LISPRO (75-25 MIX) 100 UNIT/ML KWIKPEN: PEN_INJECTOR | SUBCUTANEOUS | Status: DC

## 2013-07-10 NOTE — Patient Instructions (Addendum)
check your blood sugar twice a day.  vary the time of day when you check, between before the 3 meals, and at bedtime.  also check if you have symptoms of your blood sugar being too high or too low.  please keep a record of the readings and bring it to your next appointment here.  please call us sooner if your blood sugar goes below 70, or if you have a lot of readings over 200.  Please change both current insulins to humalog 75/25, 20 units with breakfast (10 if you are going to be active), and 15 units with your evening meal. Please come back for a follow-up appointment in 6 weeks.

## 2013-07-10 NOTE — Progress Notes (Signed)
Subjective:    Patient ID: [name], female    DOB: 09-Dec-1931, 79 y.o.   MRN: [id number]  HPI pt returns for f/u of insulin-requiring DM (dx'ed 1994; she has mild if any neuropathy of the lower extremities; she has associated CAD.  she brings a record of her cbg's which i have reviewed today.  It varies from 72-200.  There is no trend throughout the day, except it is lower with exertion.  She has trouble remembering the lunch insulin.   pt states she feels well in general. Past Medical History  Diagnosis Date  . Hypopotassemia     PMH of  . Anemia     iron defficiency  . Type II or unspecified type diabetes mellitus without mention of complication, uncontrolled     Dr Everardo All  . Muscle pain   . Degenerative joint disease   . Atrial fibrillation or flutter     s/p RFCA 7/08;   s/p DCCV in past;   previously on amiodarone;  amio stopped due to lung toxicity  . CAD (coronary artery disease)     s/p NSTEMI tx with BMS to OM1 3/08;  cath 3/08: pOM 99% tx with PCI, pLAD 20%, ? mod stenosis at the AM  . HLD (hyperlipidemia)   . HTN (hypertension)     essential nos  . Dystrophy, corneal stromal   . Chronic diastolic heart failure     echo 11/11:  EF 55-60%, severe LVH, mod LAE, mild MR, mildly increased PASP  . Allergy     seasonal  . Cataract   . CHF (congestive heart failure)   . Myocardial infarction 2008  . Osteoporosis   . Thyroid disease     hypothyroidism    Past Surgical History  Procedure Laterality Date  . Cholecystectomy    . Knee surgery    . Eye surgery      cataract surgery & corneal implant bilaterally  . A flutter ablation      Dr Ladona Ridgel  . Gravid 2 para 2    . Colonoscopy  6/07    2 polyps Dr. Elvina Mattes in [place]  . Bare metal stent  3/08    Dr Ladona Ridgel    History   Social History  . Marital Status: Widowed    Spouse Name: N/A    Number of Children: N/A  . Years of Education: N/A   Occupational History  . Teacher    Social History Main Topics   . Smoking status: Former Smoker    Quit date: 12/27/1968  . Smokeless tobacco: Never Used     Comment: quit 1997  . Alcohol Use: No  . Drug Use: No  . Sexually Active: Not on file   Other Topics Concern  . Not on file   Social History Narrative   Teacher. Widowed. Rarely drinks cafeine.     Current Outpatient Prescriptions on File Prior to Visit  Medication Sig Dispense Refill  . B-D ULTRAFINE III SHORT PEN 31G X 8 MM MISC USE AS DIRECTED  100 each  3  . Calcium-Magnesium-Zinc (CAL-MAG-ZINC PO) Take by mouth daily.      . carvedilol (COREG) 25 MG tablet TAKE 2 TABLETS TWICE A DAY  120 tablet  3  . Cholecalciferol (VITAMIN D3) 1000 UNITS CAPS Take by mouth daily.        . dabigatran (PRADAXA) 75 MG CAPS Take 1 capsule (75 mg total) by mouth every 12 (twelve) hours.  180 capsule  3  .  digoxin (LANOXIN) 0.125 MG tablet Take 0.5 tablets (0.0625 mg total) by mouth daily.  30 tablet  9  . folic acid (FOLVITE) 800 MCG tablet Take 800 mcg by mouth daily.      . furosemide (LASIX) 40 MG tablet TAKE 1 TABLET (40 MG TOTAL) BY MOUTH DAILY.  90 tablet  3  . glucose blood (FREESTYLE LITE) test strip DX 250.02, Check blood sugar once daily as directed  100 each  3  . Lancets (FREESTYLE) lancets Check Blood sugar daily as directed  100 each  3  . methimazole (TAPAZOLE) 5 MG tablet Take 1 tablet (5 mg total) by mouth 3 (three) times a week. MWF  40 tablet  2  . nitroGLYCERIN (NITROSTAT) 0.4 MG SL tablet Place 0.4 mg under the tongue every 5 (five) minutes as needed.        . Omega-3 Fatty Acids (FISH OIL) 1200 MG CAPS Take by mouth daily.      . potassium chloride SA (KLOR-CON M20) 20 MEQ tablet Take 1 tablet (20 mEq total) by mouth daily.  90 tablet  6  . rosuvastatin (CRESTOR) 20 MG tablet Labs DUE, 1 by mouth daily  30 tablet  0  . vitamin B-12 (CYANOCOBALAMIN) 1000 MCG tablet Take 1,000 mcg by mouth daily.        . Ipratropium-Albuterol (COMBIVENT RESPIMAT) 20-100 MCG/ACT AERS respimat Inhale 1  puff into the lungs 4 (four) times daily.  4 g  0  . SPIRIVA HANDIHALER 18 MCG inhalation capsule INHALE 1 CAPSULE VIA HANDIHALER ONCE DAILY AT THE SAME TIME EVERY DAY  90 capsule  2   No current facility-administered medications on file prior to visit.    Allergies  Allergen Reactions  . Amlodipine Besylate     REACTION: tingling in lips & gum edema  . Lobster (Shellfish Allergy)     angioedema  . Penicillins     REACTION: rash  . Valsartan     REACTION: angioedema  . Codeine     Mental status changes  . Tramadol Hcl     REACTION: nausea    Family History  Problem Relation Age of Onset  . Diabetes Mother   . Hypertension Mother   . Transient ischemic attack Mother   . Heart attack Father 85  . Arthritis Father   . Breast cancer Maternal Aunt   . Arthritis Maternal Aunt   . Arthritis Mother   . Hypertension Father     BP 138/80  Pulse 57  Temp(Src) 97.6 F (36.4 C) (Oral)  Resp 14  Ht 5\' 3"  (1.6 m)  Wt 154 lb 1 oz (69.882 kg)  BMI 27.3 kg/m2  SpO2 95%  Review of Systems She has gained a few lbs.  Denies loc.     Objective:   Physical Exam VITAL SIGNS:  See vs page GENERAL: no distress PSYCH: Alert and oriented x 3.  Does not appear anxious nor depressed.       Assessment & Plan:  DM: This insulin regimen was chosen from multiple options, as it best matches his insulin to his changing requirements throughout the day.  However, she is having trouble complying with it.  The benefits of glycemic control must be weighed against the risks of hypoglycemia.

## 2013-07-11 ENCOUNTER — Emergency Department (HOSPITAL_COMMUNITY): Payer: Medicare Other

## 2013-07-11 ENCOUNTER — Emergency Department (HOSPITAL_COMMUNITY)
Admission: EM | Admit: 2013-07-11 | Discharge: 2013-07-11 | Disposition: A | Discharge status: Home / Self Care | Payer: Medicare Other | Attending: Emergency Medicine | Admitting: Emergency Medicine

## 2013-07-11 ENCOUNTER — Encounter (HOSPITAL_COMMUNITY): Payer: Self-pay | Admitting: Emergency Medicine

## 2013-07-11 ENCOUNTER — Telehealth: Payer: Self-pay | Admitting: Internal Medicine

## 2013-07-11 DIAGNOSIS — M1990 Unspecified osteoarthritis, unspecified site: Secondary | ICD-10-CM | POA: Insufficient documentation

## 2013-07-11 DIAGNOSIS — S0100XA Unspecified open wound of scalp, initial encounter: Secondary | ICD-10-CM | POA: Insufficient documentation

## 2013-07-11 DIAGNOSIS — I252 Old myocardial infarction: Secondary | ICD-10-CM | POA: Insufficient documentation

## 2013-07-11 DIAGNOSIS — M810 Age-related osteoporosis without current pathological fracture: Secondary | ICD-10-CM | POA: Insufficient documentation

## 2013-07-11 DIAGNOSIS — Z8739 Personal history of other diseases of the musculoskeletal system and connective tissue: Secondary | ICD-10-CM | POA: Insufficient documentation

## 2013-07-11 DIAGNOSIS — IMO0001 Reserved for inherently not codable concepts without codable children: Secondary | ICD-10-CM | POA: Insufficient documentation

## 2013-07-11 DIAGNOSIS — E785 Hyperlipidemia, unspecified: Secondary | ICD-10-CM | POA: Insufficient documentation

## 2013-07-11 DIAGNOSIS — S0990XA Unspecified injury of head, initial encounter: Secondary | ICD-10-CM | POA: Insufficient documentation

## 2013-07-11 DIAGNOSIS — W1809XA Striking against other object with subsequent fall, initial encounter: Secondary | ICD-10-CM | POA: Insufficient documentation

## 2013-07-11 DIAGNOSIS — I5032 Chronic diastolic (congestive) heart failure: Secondary | ICD-10-CM | POA: Insufficient documentation

## 2013-07-11 DIAGNOSIS — I4891 Unspecified atrial fibrillation: Secondary | ICD-10-CM | POA: Insufficient documentation

## 2013-07-11 DIAGNOSIS — Y9301 Activity, walking, marching and hiking: Secondary | ICD-10-CM | POA: Insufficient documentation

## 2013-07-11 DIAGNOSIS — S0101XA Laceration without foreign body of scalp, initial encounter: Secondary | ICD-10-CM

## 2013-07-11 DIAGNOSIS — Z862 Personal history of diseases of the blood and blood-forming organs and certain disorders involving the immune mechanism: Secondary | ICD-10-CM | POA: Insufficient documentation

## 2013-07-11 DIAGNOSIS — W010XXA Fall on same level from slipping, tripping and stumbling without subsequent striking against object, initial encounter: Secondary | ICD-10-CM | POA: Insufficient documentation

## 2013-07-11 DIAGNOSIS — Z8669 Personal history of other diseases of the nervous system and sense organs: Secondary | ICD-10-CM | POA: Insufficient documentation

## 2013-07-11 DIAGNOSIS — R209 Unspecified disturbances of skin sensation: Secondary | ICD-10-CM | POA: Insufficient documentation

## 2013-07-11 DIAGNOSIS — I10 Essential (primary) hypertension: Secondary | ICD-10-CM | POA: Insufficient documentation

## 2013-07-11 DIAGNOSIS — S0181XA Laceration without foreign body of other part of head, initial encounter: Secondary | ICD-10-CM

## 2013-07-11 DIAGNOSIS — Z880 Allergy status to penicillin: Secondary | ICD-10-CM | POA: Insufficient documentation

## 2013-07-11 DIAGNOSIS — I2510 Atherosclerotic heart disease of native coronary artery without angina pectoris: Secondary | ICD-10-CM | POA: Insufficient documentation

## 2013-07-11 DIAGNOSIS — Z87891 Personal history of nicotine dependence: Secondary | ICD-10-CM | POA: Insufficient documentation

## 2013-07-11 DIAGNOSIS — Z8639 Personal history of other endocrine, nutritional and metabolic disease: Secondary | ICD-10-CM | POA: Insufficient documentation

## 2013-07-11 DIAGNOSIS — Z794 Long term (current) use of insulin: Secondary | ICD-10-CM | POA: Insufficient documentation

## 2013-07-11 DIAGNOSIS — Z7902 Long term (current) use of antithrombotics/antiplatelets: Secondary | ICD-10-CM | POA: Insufficient documentation

## 2013-07-11 DIAGNOSIS — Y92009 Unspecified place in unspecified non-institutional (private) residence as the place of occurrence of the external cause: Secondary | ICD-10-CM | POA: Insufficient documentation

## 2013-07-11 DIAGNOSIS — E876 Hypokalemia: Secondary | ICD-10-CM | POA: Insufficient documentation

## 2013-07-11 DIAGNOSIS — Z79899 Other long term (current) drug therapy: Secondary | ICD-10-CM | POA: Insufficient documentation

## 2013-07-11 DIAGNOSIS — S0180XA Unspecified open wound of other part of head, initial encounter: Secondary | ICD-10-CM | POA: Insufficient documentation

## 2013-07-11 MED ORDER — BACITRACIN ZINC 500 UNIT/GM EX OINT: TOPICAL_OINTMENT | Freq: Two times a day (BID) | CUTANEOUS | Status: DC

## 2013-07-11 MED ORDER — NAPROXEN 500 MG PO TABS: 500.0000 mg | ORAL_TABLET | Freq: Two times a day (BID) | ORAL | Status: DC

## 2013-07-11 NOTE — Telephone Encounter (Signed)
Last night when walking up several flights of stairs she became very lightheaded heart was racing and she put her head down on the railing and the next thing she knew she was out.  She had gone to the gym earlier and was fine.  She had taken Humolog at 7 and wanted more exercise so at 9pm went to walk the stairs like she often does.  It is a total of 60 steps and this is when it happened.  She feels ok today but just wanted to pass this along to Dr Ladona Ridgel

## 2013-07-11 NOTE — Telephone Encounter (Signed)
Discussed with Dr Ladona Ridgel, he does not want the patient walking the steps at her complex.  He says it is okay to continue the gym, but no steps.  She is going to continue with all her medications as prescribed and if she has another episode she will call us and let us know

## 2013-07-11 NOTE — Telephone Encounter (Signed)
New prob  Pt would like to speak with you regarding an incident that happen last night, so you can discuss it with Dr Ladona Ridgel.

## 2013-07-11 NOTE — ED Provider Notes (Signed)
History    CSN: [id number] Arrival date & time 07/11/13  2051  First MD Initiated Contact with Patient 07/11/13 2127     Chief Complaint  Patient presents with  . Fall   (Consider location/radiation/quality/duration/timing/severity/associated sxs/prior Treatment) HPI Comments: 79 year old female who has a history of congestive heart failure, atrial fibrillation and is on Pradaxa.   she was walking up the stairs last night, got to the top of the stairs and was feeling short winded as she usually does, became acutely lightheaded and fell striking her head on the railing and the ground. She suffered laceration to her right forehead, and has not been evaluated for this since that time. She has no headache, no neck pain, no difficulty with ambulation, her normal balance speech, no numbness or weakness other than the chronic left pinky numbness over the last 2 months. She presents because she wants her laceration sutured, she has no other complaints. It is not bleeding, she was seen at an urgent care prior to arrival where they applied a bandage and referred her here to a higher level of care. She has already discussed her care with cardiologist today who is not surprised that she had shortness of breath and did not want any further cardiac evaluation according to the patient's report.   Patient is a 79 y.o. female presenting with fall. The history is provided by the patient and a relative.  Fall   Past Medical History  Diagnosis Date  . Hypopotassemia     PMH of  . Anemia     iron defficiency  . Type II or unspecified type diabetes mellitus without mention of complication, uncontrolled     Dr Everardo All  . Muscle pain   . Degenerative joint disease   . Atrial fibrillation or flutter     s/p RFCA 7/08;   s/p DCCV in past;   previously on amiodarone;  amio stopped due to lung toxicity  . CAD (coronary artery disease)     s/p NSTEMI tx with BMS to OM1 3/08;  cath 3/08: pOM 99% tx with PCI,  pLAD 20%, ? mod stenosis at the AM  . HLD (hyperlipidemia)   . HTN (hypertension)     essential nos  . Dystrophy, corneal stromal   . Chronic diastolic heart failure     echo 11/11:  EF 55-60%, severe LVH, mod LAE, mild MR, mildly increased PASP  . Allergy     seasonal  . Cataract   . CHF (congestive heart failure)   . Myocardial infarction 2008  . Osteoporosis   . Thyroid disease     hypothyroidism   Past Surgical History  Procedure Laterality Date  . Cholecystectomy    . Knee surgery    . Eye surgery      cataract surgery & corneal implant bilaterally  . A flutter ablation      Dr Ladona Ridgel  . Gravid 2 para 2    . Colonoscopy  6/07    2 polyps Dr. Elvina Mattes in [place]  . Bare metal stent  3/08    Dr Ladona Ridgel   Family History  Problem Relation Age of Onset  . Diabetes Mother   . Hypertension Mother   . Transient ischemic attack Mother   . Heart attack Father 30  . Arthritis Father   . Breast cancer Maternal Aunt   . Arthritis Maternal Aunt   . Arthritis Mother   . Hypertension Father    History  Substance Use Topics  .  Smoking status: Former Smoker    Quit date: 12/27/1968  . Smokeless tobacco: Never Used     Comment: quit 1997  . Alcohol Use: No   OB History   Grav Para Term Preterm Abortions TAB SAB Ect Mult Living                 Review of Systems  All other systems reviewed and are negative.    Allergies  Amlodipine besylate; Lobster; Penicillins; Valsartan; Codeine; and Tramadol hcl  Home Medications   Current Outpatient Rx  Name  Route  Sig  Dispense  Refill  . Calcium-Magnesium-Zinc (CAL-MAG-ZINC PO)   Oral   Take 1 tablet by mouth daily.          . carvedilol (COREG) 25 MG tablet   Oral   Take 50 mg by mouth 2 (two) times daily with a meal.         . Cholecalciferol (VITAMIN D3) 1000 UNITS CAPS   Oral   Take 1 capsule by mouth daily.          . dabigatran (PRADAXA) 75 MG CAPS   Oral   Take 1 capsule (75 mg total) by mouth every 12  (twelve) hours.   180 capsule   3   . digoxin (LANOXIN) 0.125 MG tablet   Oral   Take 0.5 tablets (0.0625 mg total) by mouth daily.   30 tablet   9   . folic acid (FOLVITE) 800 MCG tablet   Oral   Take 800 mcg by mouth daily.         . furosemide (LASIX) 40 MG tablet   Oral   Take 40 mg by mouth daily.         . Insulin Lispro Prot & Lispro (75-25) 100 UNIT/ML SUPN   Subcutaneous   Inject 15-20 Units into the skin 2 (two) times daily with a meal. 20 units with breakfast, and 15 units with the evening meal         . methimazole (TAPAZOLE) 5 MG tablet   Oral   Take 1 tablet (5 mg total) by mouth 3 (three) times a week. MWF   40 tablet   2   . Omega-3 Fatty Acids (FISH OIL) 1200 MG CAPS   Oral   Take 1 capsule by mouth daily.          . potassium chloride SA (K-DUR,KLOR-CON) 20 MEQ tablet   Oral   Take 10 mEq by mouth 2 (two) times daily.         . rosuvastatin (CRESTOR) 20 MG tablet   Oral   Take 20 mg by mouth daily.         . vitamin B-12 (CYANOCOBALAMIN) 1000 MCG tablet   Oral   Take 1,000 mcg by mouth daily.           . B-D ULTRAFINE III SHORT PEN 31G X 8 MM MISC      USE AS DIRECTED   100 each   3   . bacitracin ointment   Topical   Apply topically 2 (two) times daily.   15 g   0   . glucose blood (FREESTYLE LITE) test strip      DX 250.02, Check blood sugar once daily as directed   100 each   3     DX Code Needed .   Marland Kitchen Lancets (FREESTYLE) lancets      Check Blood sugar daily as directed   100 each  3   . naproxen (NAPROSYN) 500 MG tablet   Oral   Take 1 tablet (500 mg total) by mouth 2 (two) times daily with a meal.   30 tablet   0   . nitroGLYCERIN (NITROSTAT) 0.4 MG SL tablet   Sublingual   Place 0.4 mg under the tongue every 5 (five) minutes as needed. x3 doses as needed for chest pain          BP 160/63  Pulse 58  Temp(Src) 97.8 F (36.6 C) (Oral)  Resp 18  SpO2 98% Physical Exam  Nursing note and vitals  reviewed. Constitutional: She appears well-developed and well-nourished. No distress.  HENT:  Head: Normocephalic.  Mouth/Throat: Oropharynx is clear and moist. No oropharyngeal exudate.  Small laceration to the right forehead, no underlying hematoma, or depression of skull fracture.  Small hematoma to the right temporal area  no facial tenderness, deformity, malocclusion or hemotympanum.  no battle's sign or racoon eyes.   Eyes: Conjunctivae and EOM are normal. Pupils are equal, round, and reactive to light. Right eye exhibits no discharge. Left eye exhibits no discharge. No scleral icterus.  Neck: Normal range of motion. Neck supple. No JVD present. No thyromegaly present.  Cardiovascular: Normal rate, regular rhythm, normal heart sounds and intact distal pulses.  Exam reveals no gallop and no friction rub.   No murmur heard. Pulmonary/Chest: Effort normal and breath sounds normal. No respiratory distress. She has no wheezes. She has no rales.  Abdominal: Soft. Bowel sounds are normal. She exhibits no distension and no mass. There is no tenderness.  Musculoskeletal: Normal range of motion. She exhibits no edema and no tenderness.  Lymphadenopathy:    She has no cervical adenopathy.  Neurological: She is alert. Coordination normal.  Skin: Skin is warm and dry. No rash noted. No erythema.  Psychiatric: She has a normal mood and affect. Her behavior is normal.    ED Course  Procedures (including critical care time) Labs Reviewed - No data to display Ct Head Wo Contrast  07/11/2013   *RADIOLOGY REPORT*  Clinical Data: Syncope with forehead laceration.  CT HEAD WITHOUT CONTRAST  Technique:  Contiguous axial images were obtained from the base of the skull through the vertex without contrast.  Comparison: None.  Findings:  Calvarium: Small right forehead contusion.  No acute osseous abnormality.  No lytic or blastic lesion. Diffuse calvarial thickening.  Orbits:   Cataract resection  bilaterally.  Brain: No evidence of acute abnormality, such as acute infarction, hemorrhage, hydrocephalus, or mass lesion/mass effect.Senescent changes including cerebral volume loss.  Intracranial calcific atherosclerosis.  IMPRESSION: No evidence of acute intracranial disease.   Original Report Authenticated By: Tiburcio Pea   1. Head injury, acute, initial encounter   2. Laceration of forehead, initial encounter   3. Laceration of scalp, initial encounter     MDM  Given the patient's use of accident or head injury it would be wise to obtain CT imaging to rule out intracranial hemorrhage however with no focal neurologic deficits and no headache, hopefully there is no intracranial hemorrhage. We'll perform a primary repair of the laceration however he discussed with her that this is greater than 24 hours old and has a decreased chance of healthy repair and small scar. She has accepted the risk of the CT scan as well as the laceration repair.  CT neg for acute findings - lac's repaired, pt stable for d/c, no signs of infection in the wounds.  LACERATION REPAIR Performed by:  Syncere Kaminski D Authorized by: Eber Hong D Consent: Verbal consent obtained. Risks and benefits: risks, benefits and alternatives were discussed Consent given by: patient Patient identity confirmed: provided demographic data Prepped and Draped in normal sterile fashion Wound explored  Laceration Location: R forehead  Laceration Length: 2 cm  No Foreign Bodies seen or palpated  Anesthesia: local infiltration  Local anesthetic: lidocaine 2 % without epinephrine  Anesthetic total: 1.5 ml  Irrigation method: syringe Amount of cleaning: standard  Skin closure: 6-0 prolene  Number of sutures: 4  Technique: simple interrupted  Patient tolerance: Patient tolerated the procedure well with no immediate complications.   LACERATION REPAIR Performed by: Vida Roller Authorized by: Vida Roller Consent:  Verbal consent obtained. Risks and benefits: risks, benefits and alternatives were discussed Consent given by: patient Patient identity confirmed: provided demographic data Prepped and Draped in normal sterile fashion Wound explored  Laceration Location: R scalp  Laceration Length: 1.5 cm  No Foreign Bodies seen or palpated  Anesthesia: local infiltration  Local anesthetic: none  Anesthetic total: None  Irrigation method: syringe Amount of cleaning: standard  Skin closure: Staple  Number of sutures: 1  Technique: staple  Patient tolerance: Patient tolerated the procedure well with no immediate complications.   Meds given in ED:  Medications - No data to display  New Prescriptions   BACITRACIN OINTMENT    Apply topically 2 (two) times daily.   NAPROXEN (NAPROSYN) 500 MG TABLET    Take 1 tablet (500 mg total) by mouth 2 (two) times daily with a meal.      Vida Roller, MD 07/11/13 2310

## 2013-07-11 NOTE — ED Notes (Signed)
PT. TRIPPED AND FELL AT HOME LAST NIGHT , PRESENTS WITH RIGHT UPPER FOREHEAD SCALP LACERATION APPROX. 1/2 INCH .BLEEDING CONTROLLED . ALERT AND ORIENTED [name].

## 2013-07-12 ENCOUNTER — Telehealth: Payer: Self-pay | Admitting: Internal Medicine

## 2013-07-12 NOTE — Telephone Encounter (Signed)
Patient Information:  Caller Name: Linsie  Phone: [phone]  Patient: [name], [name]  Gender: Female  DOB: 1931-09-06  Age: 79 Years  PCP: Marga Melnick  Office Follow Up:  Does the office need to follow up with this patient?: No  Instructions For The Office: N/A  RN Note:  Seen at ED 07/11/13 for gash in forehead, and lac repair and CT were done.  Got several stitches.  Imaging was normal.  Fall occurred 07/10/13.  Takes pradaxa and was prescribed naproxyn, but has been told to never take Aleve-type medications with her pradaxa.  States only pain is a mild headache.  Has appt 07/19/13 with Dr. Alwyn Ren for her physical, and can have, hopefully she says, her stitches and staple removed.  Advised tylenol for mild headache.  Triaged for head injury; as already seen and treated, emergent symptoms denied.  Home care measures advised, with callback parameters given.  krs/can  Symptoms  Reason For Call & Symptoms: dizziness with rapid heartbeat during a walk, and fell, head bleeding  Reviewed Health History In EMR: Yes  Reviewed Medications In EMR: Yes  Reviewed Allergies In EMR: Yes  Reviewed Surgeries / Procedures: Yes  Date of Onset of Symptoms: 07/10/2013  Guideline(s) Used:  Dizziness  Head Injury  Disposition Per Guideline:   Home Care  Reason For Disposition Reached:   Minor head injury  Advice Given:  Reassurance - Direct Blow (Contusion, Bruise)  This sounds like a scalp injury rather than a brain injury or concussion. Treatment at home should be safe. A direct blow to your scalp can cause a contusion. Contusion is the medical term for bruise.  Symptoms are mild pain, swelling, and/or bruising. Sometimes there can also be mild dizziness and nausea.  Apply a Cold Pack:  Apply a cold pack or an ice bag (wrapped in a moist towel) to the area for 20 minutes. Repeat in 1 hour, then every 4 hours while awake.  Continue this for the first 48 hours after an injury.  This  will help decrease pain and swelling.  Apply Heat to the Area:  Beginning 48 hours after an injury, apply a warm washcloth or heating pad for 10 minutes three times a day.  This will help increase blood flow and improve healing.  Expected Course:  Most head trauma only causes an injury to the scalp. Pain, swelling, and bruising usually start to get better 2 to 3 days after an injury.  Swelling most often is gone after 1 week.  Bruises fade away slowly over 1-2 weeks.  It may take 2 weeks for pain and tenderness of the injured area to go away.  Call Back If:  Severe headache  Extremity weakness or numbness occurs  Slurred speech or blurred vision occurs  Vomiting occurs  You become worse.  Patient Will Follow Care Advice:  YES

## 2013-07-12 NOTE — Telephone Encounter (Signed)
Will forward to PCP for review

## 2013-07-16 ENCOUNTER — Other Ambulatory Visit: Payer: Self-pay | Admitting: Internal Medicine

## 2013-07-17 ENCOUNTER — Other Ambulatory Visit: Payer: Self-pay | Admitting: Internal Medicine

## 2013-07-18 NOTE — Progress Notes (Signed)
Patient was seen on 07/17/13 for the third of a series of three diabetes self-management courses at the [facility]. The following learning objectives were met by the patient during this course:    Describe how diabetes changes over time   Identify diabetes complications and ways to prevent them   Describe strategies that can promote heart health including lowering blood pressure and cholesterol   Describe strategies to lower dietary fat and sodium in the diet   Identify physical activities that benefit cardiovascular health   Describe role of stress on blood glucose and develop strategies to address psychosocial issues   Evaluate success in meeting personal goal   Describe the belief that they can live successfully with diabetes day to day   Establish 2-3 goals that they will plan to diligently work on until they return for the free 4-month follow-up visit  The following handouts were given in class:  Goal setting handout  Class evaluation form  Low-sodium seasoning tips  Stress management handout  Your patient has established the following 4 month goals for diabetes self-care:  Increase physical activity at least 3 days/week  Take diabetes medications as scheduled  Test glucose BID at least 3 days/week  Your patient has identified these potential barriers to change:  Not as disciplined at she'd like to be  Your patient has identified their diabetes self-care support plan as:  Support group at [facility]   Follow-Up Plan: Patient was offered a 4 month follow-up visit for diabetes self-management education.

## 2013-07-19 ENCOUNTER — Ambulatory Visit (INDEPENDENT_AMBULATORY_CARE_PROVIDER_SITE_OTHER): Payer: Medicare Other | Admitting: Internal Medicine

## 2013-07-19 ENCOUNTER — Encounter: Payer: Self-pay | Admitting: Internal Medicine

## 2013-07-19 VITALS — BP 122/74 | HR 72 | Temp 97.6°F | Resp 12 | Ht 64.0 in | Wt 150.0 lb

## 2013-07-19 DIAGNOSIS — E782 Mixed hyperlipidemia: Secondary | ICD-10-CM

## 2013-07-19 DIAGNOSIS — Z0000 Encounter for general adult medical examination without abnormal findings: Secondary | ICD-10-CM

## 2013-07-19 DIAGNOSIS — I10 Essential (primary) hypertension: Secondary | ICD-10-CM

## 2013-07-19 DIAGNOSIS — E0590 Thyrotoxicosis, unspecified without thyrotoxic crisis or storm: Secondary | ICD-10-CM

## 2013-07-19 NOTE — Progress Notes (Signed)
Subjective:    Patient ID: [name], female    DOB: 03-11-1931, 79 y.o.   MRN: [id number]  HPI  Medicare Wellness Visit:  Psychosocial & medical history were reviewed as required by Medicare (abuse,antisocial behavioral risks,firearm risk).  Social history: caffeine: 1 cup / day , alcohol:extremely rarely   ,  tobacco use:   Quit 1970 Exercise :  See below Home & personal  safety / fall risk: see below Limitations of activities of daily living: no Seatbelt  and smoke alarm use: yes Power of Attorney/Living Will status : needed Ophthalmology exam status :current Hearing evaluation status: not current Orientation :oriented X 3 Memory & recall :good Spelling  testing:good Active depression / anxiety: denied Foreign travel history : last in 71s [place] Immunization status for Shingles /Flu/ PNA/ tetanus : Shingles due Transfusion history:  no Preventive health surveillance status of colonoscopy, BMD , mammograms,PAP as per protocol/ SOC: Every 3 yrs or as needed Dental care: every 12 mos  Chart reviewed &  Updated. Active issues reviewed & addressed.      Review of Systems  The emergency room records 07/11/13 were reviewed. She had climbed 3 flights of stairs as is her custom @ least 3 times a week for exercise. She noted her heart to be racing and to have shortness of breath. She experienced syncope for  she feels was less than 1 minute. She sustained 2 lacerations of the skull requiring sutures.  This event was in the context of sinoatrial dysfunction for which she's had ablation. She also has a history of congestive heart failure and paroxysmal atrial fibrillation/flutter for which she is on Pradaxa.     Objective:   Physical Exam Gen.: Healthy and well-nourished in appearance. Alert, appropriate and cooperative throughout exam.Appears younger than stated age  Head: Normocephalic without obvious abnormalities;  Lacerations w/o infections  Eyes: No corneal or  conjunctival inflammation noted.  Extraocular motion intact. Vision grossly normal without lenses Ears: External  ear exam reveals no significant lesions or deformities. Canals clear .TMs normal. Hearing is grossly normal bilaterally. Nose: External nasal exam reveals no deformity or inflammation. Nasal mucosa are pink and moist. No lesions or exudates noted.   Mouth: Oral mucosa and oropharynx reveal no lesions or exudates. Teeth in good repair. Neck: No deformities, masses, or tenderness noted. Range of motion decreased. Thyroid small. Lungs: Normal respiratory effort; chest expands symmetrically. Lungs are clear to auscultation without rales, wheezes, or increased work of breathing. Heart: Normal rate and rhythm. Normal S1 and S2. No gallop, click, or rub. S4 w/o  murmur. Abdomen: Bowel sounds normal; abdomen soft and nontender. No masses, organomegaly or hernias noted. Genitalia: As per Gyn                                  Musculoskeletal/extremities: No deformity or scoliosis noted of  the thoracic or lumbar spine.  No clubbing, cyanosis, or  edema noted. Range of motion normal .Tone & strength  Normal. Joints reveal mixed  PIP &  DIP changes. Nail health good. Able to lie down & sit up w/o help. Negative SLR bilaterally Vascular: Carotid, radial artery, dorsalis pedis and  posterior tibial pulses are full and equal. No bruits present. Neurologic: Alert and oriented x3. Deep tendon reflexes symmetrical and normal.        Skin: Intact without suspicious lesions or rashes. Ecchymoses RUE Lymph: No cervical, axillary lymphadenopathy present.  Psych: Mood and affect are normal. Normally interactive                                                                                        Assessment & Plan:  #1 Medicare Wellness Exam; criteria met ; data entered #2 Problem List/Diagnoses reviewed #3 sutures removed Plan:  Assessments made/ Orders entered

## 2013-07-19 NOTE — Patient Instructions (Addendum)
Please  schedule fasting Labs in mid-late September after nutrition changes: BMET,Lipids, hepatic panel, TSH, A1c. PLEASE BRING THESE INSTRUCTIONS TO FOLLOW UP  LAB APPOINTMENT.This will guarantee correct labs are drawn, eliminating need for repeat blood sampling ( needle sticks ! ). Diagnoses /Codes: 250.02; 272.4

## 2013-07-28 ENCOUNTER — Encounter: Payer: Self-pay | Admitting: Internal Medicine

## 2013-08-01 ENCOUNTER — Other Ambulatory Visit: Payer: Self-pay

## 2013-08-11 ENCOUNTER — Other Ambulatory Visit: Payer: Self-pay | Admitting: Internal Medicine

## 2013-08-13 ENCOUNTER — Telehealth: Payer: Self-pay | Admitting: Internal Medicine

## 2013-08-13 NOTE — Telephone Encounter (Signed)
Lmtcb/ we can fax script to pharmacy/ place it in the mail or she can pick it up at desk, asked her to call back to clarify.

## 2013-08-13 NOTE — Telephone Encounter (Signed)
Patient would like printed rx for crestor and spiriva. She is going to be using a new pharmacy that does not charge her. Patient states to leave a detailed message on her home phone #.

## 2013-08-13 NOTE — Telephone Encounter (Signed)
New problem    Pt wants to know if dr taylor can fax a prescription for pradaxa to her or will she need to come in and pick it up-ok to leave a detailed msg on her answering machine

## 2013-08-14 ENCOUNTER — Telehealth: Payer: Self-pay | Admitting: Internal Medicine

## 2013-08-14 MED ORDER — TIOTROPIUM BROMIDE MONOHYDRATE 18 MCG IN CAPS: 18.0000 ug | ORAL_CAPSULE | Freq: Every day | RESPIRATORY_TRACT | Status: DC

## 2013-08-14 NOTE — Telephone Encounter (Signed)
Hopp please advise on request for sprivia, for it was removed from medication list because patient stated at last OV she stopped taking due to cost

## 2013-08-14 NOTE — Telephone Encounter (Signed)
Patient called requesting samples of Crestor 20 mg. If available, she will come pick up 08/15/13.

## 2013-08-14 NOTE — Addendum Note (Signed)
Addended by: Maurice Small on: 08/14/2013 01:39 PM   Modules accepted: Orders

## 2013-08-14 NOTE — Telephone Encounter (Signed)
Pt needs written pradaxa 75 mg script/ given to Taylor/ nurse. Samples provided.

## 2013-08-14 NOTE — Telephone Encounter (Signed)
Samples available.  Placed at front desk for pick up.

## 2013-08-14 NOTE — Telephone Encounter (Signed)
Both OK X3 mos 

## 2013-08-14 NOTE — Telephone Encounter (Signed)
Message left on voicemail informing patient rx's are ready for pick-up

## 2013-08-21 ENCOUNTER — Telehealth: Payer: Self-pay | Admitting: Internal Medicine

## 2013-08-21 ENCOUNTER — Ambulatory Visit (INDEPENDENT_AMBULATORY_CARE_PROVIDER_SITE_OTHER): Payer: Medicare Other | Admitting: Endocrinology

## 2013-08-21 ENCOUNTER — Encounter: Payer: Self-pay | Admitting: Endocrinology

## 2013-08-21 ENCOUNTER — Ambulatory Visit: Payer: BC Managed Care – PPO | Admitting: Endocrinology

## 2013-08-21 VITALS — BP 122/80 | HR 78 | Ht 64.0 in | Wt 155.0 lb

## 2013-08-21 MED ORDER — INSULIN NPH ISOPHANE & REGULAR (70-30) 100 UNIT/ML ~~LOC~~ SUSP: SUBCUTANEOUS | Status: DC

## 2013-08-21 MED ORDER — INSULIN LISPRO PROT & LISPRO (50-50 MIX) 100 UNIT/ML KWIKPEN: 15.0000 [IU] | PEN_INJECTOR | Freq: Two times a day (BID) | SUBCUTANEOUS | Status: DC

## 2013-08-21 NOTE — Progress Notes (Signed)
Subjective:    Patient ID: [name], female    DOB: 07/20/1931, 79 y.o.   MRN: [id number]  HPI pt returns for f/u of insulin-requiring DM (dx'ed 1994; she has mild if any neuropathy of the lower extremities; she has associated CAD.  she brings a record of her cbg's which i have reviewed today.   She had mild hypoglycemia once with exertion that she was unable to anticipate (52).  Otherwise, it varies from 90-300.  It is highest at hs (when she overeats), and lowest in am.   Past Medical History  Diagnosis Date  . Hypopotassemia     PMH of  . Anemia     iron defficiency  . Type II or unspecified type diabetes mellitus without mention of complication, uncontrolled     Dr Everardo All  . Muscle pain   . Degenerative joint disease   . Atrial fibrillation or flutter     s/p RFCA 7/08;   s/p DCCV in past;   previously on amiodarone;  amio stopped due to lung toxicity  . CAD (coronary artery disease)     s/p NSTEMI tx with BMS to OM1 3/08;  cath 3/08: pOM 99% tx with PCI, pLAD 20%, ? mod stenosis at the AM  . HLD (hyperlipidemia)   . HTN (hypertension)     essential nos  . Dystrophy, corneal stromal   . Chronic diastolic heart failure     echo 11/11:  EF 55-60%, severe LVH, mod LAE, mild MR, mildly increased PASP  . Allergy     seasonal  . Cataract   . CHF (congestive heart failure)   . Myocardial infarction 2008  . Osteoporosis   . Thyroid disease     hypothyroidism    Past Surgical History  Procedure Laterality Date  . Cholecystectomy    . Knee surgery    . Eye surgery      cataract surgery & corneal implant bilaterally  . A flutter ablation      Dr Ladona Ridgel  . Gravid 2 para 2    . Colonoscopy  6/07    2 polyps Dr. Elvina Mattes in [place]  . Bare metal stent  02/2007    Dr Ladona Ridgel  . Colonoscopy with polypectomy       X 2; [facility]    History   Social History  . Marital Status: Widowed    Spouse Name: N/A    Number of Children: N/A  . Years of Education: N/A    Occupational History  . Teacher    Social History Main Topics  . Smoking status: Former Smoker    Quit date: 12/27/1968  . Smokeless tobacco: Never Used     Comment: smoked 1952- 1970, up to 1 pp week  . Alcohol Use: No  . Drug Use: No  . Sexual Activity: Not on file   Other Topics Concern  . Not on file   Social History Narrative   Teacher. Widowed. Rarely drinks cafeine.     Current Outpatient Prescriptions on File Prior to Visit  Medication Sig Dispense Refill  . B-D ULTRAFINE III SHORT PEN 31G X 8 MM MISC USE AS DIRECTED  100 each  3  . bacitracin ointment Apply topically 2 (two) times daily.  15 g  0  . Calcium-Magnesium-Zinc (CAL-MAG-ZINC PO) Take 1 tablet by mouth daily.       . carvedilol (COREG) 25 MG tablet Take 50 mg by mouth 2 (two) times daily with a meal.      .  Cholecalciferol (VITAMIN D3) 1000 UNITS CAPS Take 1 capsule by mouth daily.       . CRESTOR 20 MG tablet TAKE 1 TABLET BY MOUTH EVERY DAY  30 tablet  0  . digoxin (LANOXIN) 0.125 MG tablet Take 0.5 tablets (0.0625 mg total) by mouth daily.  30 tablet  9  . folic acid (FOLVITE) 800 MCG tablet Take 800 mcg by mouth daily.      . furosemide (LASIX) 40 MG tablet TAKE 1 TABLET (40 MG TOTAL) BY MOUTH DAILY.  90 tablet  1  . glucose blood (FREESTYLE LITE) test strip DX 250.02, Check blood sugar once daily as directed  100 each  3  . Lancets (FREESTYLE) lancets Check Blood sugar daily as directed  100 each  3  . methimazole (TAPAZOLE) 5 MG tablet Take 1 tablet (5 mg total) by mouth 3 (three) times a week. MWF  40 tablet  2  . nitroGLYCERIN (NITROSTAT) 0.4 MG SL tablet Place 0.4 mg under the tongue every 5 (five) minutes as needed. x3 doses as needed for chest pain      . Omega-3 Fatty Acids (FISH OIL) 1200 MG CAPS Take 1 capsule by mouth daily.       . potassium chloride SA (K-DUR,KLOR-CON) 20 MEQ tablet Take 10 mEq by mouth 2 (two) times daily.      Marland Kitchen PRADAXA 75 MG CAPS TAKE 1 CAPSULE EVERY 12 HOURS  180 capsule   1  . rosuvastatin (CRESTOR) 20 MG tablet Take 20 mg by mouth daily.      Marland Kitchen tiotropium (SPIRIVA) 18 MCG inhalation capsule Place 1 capsule (18 mcg total) into inhaler and inhale daily.  30 capsule  2  . vitamin B-12 (CYANOCOBALAMIN) 1000 MCG tablet Take 1,000 mcg by mouth daily.         No current facility-administered medications on file prior to visit.    Allergies  Allergen Reactions  . Amlodipine Besylate     REACTION: tingling in lips & gum edema  . Lobster [Shellfish Allergy]     angioedema  . Penicillins     REACTION: rash  . Valsartan     REACTION: angioedema  . Codeine     Mental status changes  . Tramadol Hcl     REACTION: nausea    Family History  Problem Relation Age of Onset  . Diabetes Mother   . Hypertension Mother   . Transient ischemic attack Mother   . Heart attack Father 29  . Arthritis Father   . Breast cancer Maternal Aunt   . Arthritis Maternal Aunt   . Arthritis Mother   . Hypertension Father     BP 122/80  Pulse 78  Ht 5\' 4"  (1.626 m)  Wt 155 lb (70.308 kg)  BMI 26.59 kg/m2  SpO2 98%  Review of Systems She had 1 episode of LOC, but cbg was ok then.  Denies weight change.      Objective:   Physical Exam VITAL SIGNS:  See vs page GENERAL: no distress PSYCH: Alert and oriented x 3.  Does not appear anxious nor depressed.        Assessment & Plan:  Based on the pattern of her cbg's, she needs a faster-acting insulin, bid. LOC, apparently not DM-related. CAD: this lmits exercise of DM

## 2013-08-21 NOTE — Telephone Encounter (Signed)
Patient states that new rx given to her this am is too expensive and she was told to give [place] a call to see what other options are. Call back # [phone] (H) Please advise.

## 2013-08-21 NOTE — Telephone Encounter (Signed)
i have sent a prescription to your pharmacy, to change to a cheaper insulin. However, it is cheapest ($25/bottle) at [facility]. call if you want it sent there.

## 2013-08-21 NOTE — Patient Instructions (Addendum)
check your blood sugar twice a day.  vary the time of day when you check, between before the 3 meals, and at bedtime.  also check if you have symptoms of your blood sugar being too high or too low.  please keep a record of the readings and bring it to your next appointment here.  please call us sooner if your blood sugar goes below 70, or if you have a lot of readings over 200.  Please come back for a follow-up appointment in 1 month. Please change current insulin to "50/50," at the same amount.  If you are going to be active, take just 10 units with breakfast.

## 2013-08-22 ENCOUNTER — Telehealth: Payer: Self-pay | Admitting: Endocrinology

## 2013-08-22 NOTE — Telephone Encounter (Signed)
Pt states she went ahead and got rx at [facility] she does not want this called to [facility] at this time , see message from yesterday

## 2013-08-28 ENCOUNTER — Telehealth: Payer: Self-pay | Admitting: Endocrinology

## 2013-08-28 ENCOUNTER — Other Ambulatory Visit: Payer: Self-pay | Admitting: Endocrinology

## 2013-08-28 MED ORDER — INSULIN REGULAR HUMAN 100 UNIT/ML IJ SOLN: 10.0000 [IU] | Freq: Two times a day (BID) | INTRAMUSCULAR | Status: DC

## 2013-08-28 MED ORDER — INSULIN NPH (HUMAN) (ISOPHANE) 100 UNIT/ML ~~LOC~~ SUSP: SUBCUTANEOUS | Status: DC

## 2013-08-28 MED ORDER — INSULIN LISPRO PROT & LISPRO (50-50 MIX) 100 UNIT/ML ~~LOC~~ SUSP: SUBCUTANEOUS | Status: DC

## 2013-08-28 NOTE — Telephone Encounter (Signed)
Ok, but there is no cheaper mixture similar to this.  i can prescribe cheaper, but you would need to take 2 different insulins.

## 2013-08-28 NOTE — Telephone Encounter (Signed)
Ok, i have sent a prescription to your pharmacy  

## 2013-08-28 NOTE — Telephone Encounter (Signed)
Pt advised and will go ahead and get 2 rx's sent to pharmacy now

## 2013-08-28 NOTE — Telephone Encounter (Signed)
Pt states she can not afftord the Humalog rx that was sent in today please change to a less expsensive rx or give her a sample

## 2013-08-29 ENCOUNTER — Telehealth: Payer: Self-pay | Admitting: Endocrinology

## 2013-08-29 NOTE — Telephone Encounter (Signed)
Pt advised.

## 2013-08-29 NOTE — Telephone Encounter (Signed)
Yes, take both before breakfast, and both before the evening meal

## 2013-08-29 NOTE — Telephone Encounter (Signed)
Pt states she is confused on how to take her insulin,she has rx's for Humulin N and R they both say take before meals, pt wanted toknow if she is supposed to take both insulins at the same time, please advise, (call cell #)

## 2013-09-08 ENCOUNTER — Encounter: Payer: Self-pay | Admitting: Endocrinology

## 2013-09-13 ENCOUNTER — Other Ambulatory Visit (INDEPENDENT_AMBULATORY_CARE_PROVIDER_SITE_OTHER): Payer: Medicare Other

## 2013-09-13 DIAGNOSIS — E0590 Thyrotoxicosis, unspecified without thyrotoxic crisis or storm: Secondary | ICD-10-CM

## 2013-09-13 DIAGNOSIS — I10 Essential (primary) hypertension: Secondary | ICD-10-CM

## 2013-09-13 DIAGNOSIS — E782 Mixed hyperlipidemia: Secondary | ICD-10-CM

## 2013-09-13 LAB — BASIC METABOLIC PANEL
BUN: 26 mg/dL — ABNORMAL HIGH (ref 6–23)
Calcium: 9.5 mg/dL (ref 8.4–10.5)
Creatinine, Ser: 1 mg/dL (ref 0.4–1.2)
GFR: 67.09 mL/min (ref >60.00)
Glucose, Bld: 170 mg/dL — ABNORMAL HIGH (ref 70–99)

## 2013-09-13 LAB — HEPATIC FUNCTION PANEL
Albumin: 3.5 g/dL (ref 3.5–5.2)
Alkaline Phosphatase: 71 U/L (ref 39–117)
Total Protein: 7.5 g/dL (ref 6.0–8.3)

## 2013-09-13 LAB — LIPID PANEL
Cholesterol: 157 mg/dL (ref 0–200)
HDL: 49.8 mg/dL (ref >39.00)
Total CHOL/HDL Ratio: 3
VLDL: 13.6 mg/dL (ref 0.0–40.0)

## 2013-09-13 LAB — HEMOGLOBIN A1C: Hgb A1c MFr Bld: 9.8 % — ABNORMAL HIGH (ref 4.6–6.5)

## 2013-09-16 ENCOUNTER — Encounter: Payer: Self-pay | Admitting: Internal Medicine

## 2013-09-19 ENCOUNTER — Telehealth: Payer: Self-pay | Admitting: Internal Medicine

## 2013-09-19 NOTE — Telephone Encounter (Signed)
New Problem  [name] states there was a form filled out by the Dr. for free pradaxa// she states the institution has misplaced the form and she has to complete and re-send another form. [name] is requesting to have the Dr. Fonnie Birkenhead sign. She asks if she can bring it in and get it signed the same day.

## 2013-09-19 NOTE — Telephone Encounter (Signed)
LMTCB 3:50pm/PE

## 2013-09-24 ENCOUNTER — Encounter: Payer: Self-pay | Admitting: Endocrinology

## 2013-09-24 ENCOUNTER — Ambulatory Visit (INDEPENDENT_AMBULATORY_CARE_PROVIDER_SITE_OTHER): Payer: Medicare Other | Admitting: Endocrinology

## 2013-09-24 VITALS — BP 142/80 | HR 80 | Temp 97.7°F | Wt 155.0 lb

## 2013-09-24 DIAGNOSIS — Z23 Encounter for immunization: Secondary | ICD-10-CM

## 2013-09-24 DIAGNOSIS — IMO0001 Reserved for inherently not codable concepts without codable children: Secondary | ICD-10-CM

## 2013-09-24 MED ORDER — INSULIN REGULAR HUMAN 100 UNIT/ML IJ SOLN: 10.0000 [IU] | Freq: Two times a day (BID) | INTRAMUSCULAR | Status: DC

## 2013-09-24 MED ORDER — INSULIN NPH (HUMAN) (ISOPHANE) 100 UNIT/ML ~~LOC~~ SUSP: 10.0000 [IU] | Freq: Two times a day (BID) | SUBCUTANEOUS | Status: DC

## 2013-09-24 NOTE — Progress Notes (Signed)
Subjective:    Patient ID: [name], female    DOB: August 14, 1931, 79 y.o.   MRN: [id number]  HPI pt returns for f/u of insulin-requiring DM (dx'ed 1994; she has mild if any neuropathy of the lower extremities; she has associated CAD; therapy limited by pt's request for least expensive insulins).  she brings a record of her cbg's which i have reviewed today.  It varies from 100-200, but most are in the mid-100's.  It is most consistently high in am.   Pt states 4 days of moderate rash on the legs, but no assoc itching.   Past Medical History  Diagnosis Date  . Hypopotassemia     PMH of  . Anemia     iron defficiency  . Type II or unspecified type diabetes mellitus without mention of complication, uncontrolled     Dr Everardo All  . Muscle pain   . Degenerative joint disease   . Atrial fibrillation or flutter     s/p RFCA 7/08;   s/p DCCV in past;   previously on amiodarone;  amio stopped due to lung toxicity  . CAD (coronary artery disease)     s/p NSTEMI tx with BMS to OM1 3/08;  cath 3/08: pOM 99% tx with PCI, pLAD 20%, ? mod stenosis at the AM  . HLD (hyperlipidemia)   . HTN (hypertension)     essential nos  . Dystrophy, corneal stromal   . Chronic diastolic heart failure     echo 11/11:  EF 55-60%, severe LVH, mod LAE, mild MR, mildly increased PASP  . Allergy     seasonal  . Cataract   . CHF (congestive heart failure)   . Myocardial infarction 2008  . Osteoporosis   . Thyroid disease     hypothyroidism    Past Surgical History  Procedure Laterality Date  . Cholecystectomy    . Knee surgery    . Eye surgery      cataract surgery & corneal implant bilaterally  . A flutter ablation      Dr Ladona Ridgel  . Gravid 2 para 2    . Colonoscopy  6/07    2 polyps Dr. Elvina Mattes in [place]  . Bare metal stent  02/2007    Dr Ladona Ridgel  . Colonoscopy with polypectomy       X 2; [facility]    History   Social History  . Marital Status: Widowed    Spouse Name: N/A    Number of  Children: N/A  . Years of Education: N/A   Occupational History  . Teacher    Social History Main Topics  . Smoking status: Former Smoker    Quit date: 12/27/1968  . Smokeless tobacco: Never Used     Comment: smoked 1952- 1970, up to 1 pp week  . Alcohol Use: No  . Drug Use: No  . Sexual Activity: Not on file   Other Topics Concern  . Not on file   Social History Narrative   Teacher. Widowed. Rarely drinks cafeine.     Current Outpatient Prescriptions on File Prior to Visit  Medication Sig Dispense Refill  . B-D ULTRAFINE III SHORT PEN 31G X 8 MM MISC USE AS DIRECTED  100 each  3  . bacitracin ointment Apply topically 2 (two) times daily.  15 g  0  . Calcium-Magnesium-Zinc (CAL-MAG-ZINC PO) Take 1 tablet by mouth daily.       . carvedilol (COREG) 25 MG tablet Take 50 mg by  mouth 2 (two) times daily with a meal.      . Cholecalciferol (VITAMIN D3) 1000 UNITS CAPS Take 1 capsule by mouth daily.       . CRESTOR 20 MG tablet TAKE 1 TABLET BY MOUTH EVERY DAY  30 tablet  0  . digoxin (LANOXIN) 0.125 MG tablet Take 0.5 tablets (0.0625 mg total) by mouth daily.  30 tablet  9  . folic acid (FOLVITE) 800 MCG tablet Take 800 mcg by mouth daily.      . furosemide (LASIX) 40 MG tablet TAKE 1 TABLET (40 MG TOTAL) BY MOUTH DAILY.  90 tablet  1  . glucose blood (FREESTYLE LITE) test strip DX 250.02, Check blood sugar once daily as directed  100 each  3  . insulin NPH (HUMULIN N) 100 UNIT/ML injection 10 units with breakfast, and 5 units with the evening meal  1 vial  12  . insulin regular (HUMULIN R) 100 units/mL injection Inject 0.1 mLs (10 Units total) into the skin 2 (two) times daily before a meal.  10 mL  12  . Lancets (FREESTYLE) lancets Check Blood sugar daily as directed  100 each  3  . methimazole (TAPAZOLE) 5 MG tablet Take 1 tablet (5 mg total) by mouth 3 (three) times a week. MWF  40 tablet  2  . nitroGLYCERIN (NITROSTAT) 0.4 MG SL tablet Place 0.4 mg under the tongue every 5 (five)  minutes as needed. x3 doses as needed for chest pain      . Omega-3 Fatty Acids (FISH OIL) 1200 MG CAPS Take 1 capsule by mouth daily.       . potassium chloride SA (K-DUR,KLOR-CON) 20 MEQ tablet Take 10 mEq by mouth 2 (two) times daily.      Marland Kitchen PRADAXA 75 MG CAPS TAKE 1 CAPSULE EVERY 12 HOURS  180 capsule  1  . rosuvastatin (CRESTOR) 20 MG tablet Take 20 mg by mouth daily.      Marland Kitchen tiotropium (SPIRIVA) 18 MCG inhalation capsule Place 1 capsule (18 mcg total) into inhaler and inhale daily.  30 capsule  2  . vitamin B-12 (CYANOCOBALAMIN) 1000 MCG tablet Take 1,000 mcg by mouth daily.         No current facility-administered medications on file prior to visit.    Allergies  Allergen Reactions  . Amlodipine Besylate     REACTION: tingling in lips & gum edema  . Lobster [Shellfish Allergy]     angioedema  . Penicillins     REACTION: rash  . Valsartan     REACTION: angioedema  . Codeine     Mental status changes  . Tramadol Hcl     REACTION: nausea    Family History  Problem Relation Age of Onset  . Diabetes Mother   . Hypertension Mother   . Transient ischemic attack Mother   . Heart attack Father 71  . Arthritis Father   . Breast cancer Maternal Aunt   . Arthritis Maternal Aunt   . Arthritis Mother   . Hypertension Father     BP 142/80  Pulse 80  Temp(Src) 97.7 F (36.5 C) (Oral)  Wt 155 lb (70.308 kg)  BMI 26.59 kg/m2  SpO2 95%  Review of Systems denies hypoglycemia and fever.    Objective:   Physical Exam VITAL SIGNS:  See vs page GENERAL: no distress Skin: on the legs, there is a slight polymacular red rash.   Lab Results  Component Value Date   TSH 1.98  09/13/2013   Lab Results  Component Value Date   HGBA1C 9.8* 09/13/2013      Assessment & Plan:  Based on the pattern of her cbg's, she needs a faster-acting insulin, bid. Hyperthyroidism, well-controlled Rash, new, uncertain etiology.

## 2013-09-24 NOTE — Patient Instructions (Addendum)
check your blood sugar twice a day.  vary the time of day when you check, between before the 3 meals, and at bedtime.  also check if you have symptoms of your blood sugar being too high or too low.  please keep a record of the readings and bring it to your next appointment here.  please call us sooner if your blood sugar goes below 70, or if you have a lot of readings over 200.  Please come back for a follow-up appointment in january. Please increase the NPH to 10 units, twice a day. Please call dr hopper if the rash continues into next week.

## 2013-09-25 NOTE — Telephone Encounter (Signed)
Patient aware her paper work is ready

## 2013-10-14 ENCOUNTER — Other Ambulatory Visit: Payer: Self-pay | Admitting: Internal Medicine

## 2013-10-19 ENCOUNTER — Other Ambulatory Visit: Payer: Self-pay | Admitting: Internal Medicine

## 2013-10-22 ENCOUNTER — Telehealth: Payer: Self-pay | Admitting: *Deleted

## 2013-10-22 NOTE — Telephone Encounter (Signed)
Received letter from [employer] regarding patient medication assistance for this patient, there is not a drug on letter but I think its for the pradaxa, I called patient and left her message with a 1-800 number to call and order her medications.

## 2013-11-07 ENCOUNTER — Other Ambulatory Visit: Payer: Self-pay | Admitting: *Deleted

## 2013-11-07 MED ORDER — GLUCOSE BLOOD VI STRP: ORAL_STRIP | Status: DC

## 2013-11-08 ENCOUNTER — Other Ambulatory Visit: Payer: Self-pay | Admitting: *Deleted

## 2013-11-08 MED ORDER — GLUCOSE BLOOD VI STRP: ORAL_STRIP | Status: DC

## 2013-11-13 ENCOUNTER — Other Ambulatory Visit: Payer: Self-pay | Admitting: *Deleted

## 2013-11-13 MED ORDER — ROSUVASTATIN CALCIUM 20 MG PO TABS: ORAL_TABLET | ORAL | Status: DC

## 2013-11-13 NOTE — Telephone Encounter (Signed)
Crestor refilled and faxed to [facility] at [phone]

## 2013-11-15 ENCOUNTER — Ambulatory Visit: Payer: Medicare Other | Admitting: *Deleted

## 2013-12-10 ENCOUNTER — Telehealth: Payer: Self-pay | Admitting: Internal Medicine

## 2013-12-10 NOTE — Telephone Encounter (Signed)
Patient called and requested a refill for tiotropium (SPIRIVA) 18 MCG inhalation capsule a 90 day supply  Pharmacy [facility] Phone # [phone] Fax number [phone]

## 2013-12-11 MED ORDER — TIOTROPIUM BROMIDE MONOHYDRATE 18 MCG IN CAPS: 18.0000 ug | ORAL_CAPSULE | Freq: Every day | RESPIRATORY_TRACT | Status: DC

## 2013-12-11 NOTE — Telephone Encounter (Signed)
Rx printed and faxed to [facility].//AB/CMA

## 2013-12-12 ENCOUNTER — Other Ambulatory Visit (HOSPITAL_COMMUNITY): Payer: Self-pay | Admitting: Internal Medicine

## 2013-12-21 ENCOUNTER — Telehealth: Payer: Self-pay | Admitting: *Deleted

## 2013-12-21 NOTE — Telephone Encounter (Signed)
Patient called and stated that she stepped on a nail that did break the skin a little bit. Patient stated that she remembered getting the tetanus shot but didn't know the exact year. Advised patient that she got the shot back in 2011 and that she is covered, but did advised patient that if the area become red, blistered please scheduled an appointment to be seen.

## 2013-12-24 MED ORDER — TIOTROPIUM BROMIDE MONOHYDRATE 18 MCG IN CAPS: 18.0000 ug | ORAL_CAPSULE | Freq: Every day | RESPIRATORY_TRACT | Status: DC

## 2013-12-24 NOTE — Telephone Encounter (Signed)
Rx printed and refaxed to [facility].//AB/CMA

## 2013-12-24 NOTE — Addendum Note (Signed)
Addended by: Verdie Shire on: 12/24/2013 05:23 PM   Modules accepted: Orders

## 2013-12-28 ENCOUNTER — Telehealth: Payer: Self-pay | Admitting: Internal Medicine

## 2014-01-05 ENCOUNTER — Encounter: Payer: Self-pay | Admitting: Endocrinology

## 2014-01-16 DIAGNOSIS — Z947 Corneal transplant status: Secondary | ICD-10-CM | POA: Insufficient documentation

## 2014-01-16 DIAGNOSIS — H18429 Band keratopathy, unspecified eye: Secondary | ICD-10-CM | POA: Insufficient documentation

## 2014-01-21 ENCOUNTER — Ambulatory Visit (INDEPENDENT_AMBULATORY_CARE_PROVIDER_SITE_OTHER): Payer: Medicare Other | Admitting: Endocrinology

## 2014-01-21 ENCOUNTER — Other Ambulatory Visit: Payer: Self-pay | Admitting: Internal Medicine

## 2014-01-21 ENCOUNTER — Encounter: Payer: Self-pay | Admitting: Endocrinology

## 2014-01-21 VITALS — BP 114/62 | HR 62 | Temp 98.0°F | Ht 64.0 in | Wt 167.0 lb

## 2014-01-21 DIAGNOSIS — IMO0001 Reserved for inherently not codable concepts without codable children: Secondary | ICD-10-CM

## 2014-01-21 DIAGNOSIS — E1165 Type 2 diabetes mellitus with hyperglycemia: Principal | ICD-10-CM

## 2014-01-21 LAB — HEMOGLOBIN A1C: Hgb A1c MFr Bld: 9.6 % — ABNORMAL HIGH (ref 4.6–6.5)

## 2014-01-21 NOTE — Patient Instructions (Addendum)
check your blood sugar twice a day.  vary the time of day when you check, between before the 3 meals, and at bedtime.  also check if you have symptoms of your blood sugar being too high or too low.  please keep a record of the readings and bring it to your next appointment here.  please call us sooner if your blood sugar goes below 70, or if you have a lot of readings over 200.   Please increase the NPH to 22 units in the morning (8 if you are going to be active), and 14 units in the evening.  blood tests are being requested for you today.  We'll contact you with results.

## 2014-01-21 NOTE — Progress Notes (Signed)
Subjective:    Patient ID: [name], female    DOB: 12/25/1931, 80 y.o.   MRN: [id number]  HPI pt returns for f/u of insulin-requiring DM (dx'ed 1994, on a routine blood test; she has mild if any neuropathy of the lower extremities; she has associated CAD; she has been on insulin since 2007; therapy limited by pt's request for least expensive insulins; she has never had hypoglycemia or DKA).  she brings a record of her cbg's which i have reviewed today.  It varies from 80-200's.  She checks most in am, and occasionally in the afternoon.  It is lowest in the afternoon, after exercise, even if she reduces am insulin to just 10 units.  Most am-cbg's are in the high-100's. Past Medical History  Diagnosis Date  . Hypopotassemia     PMH of  . Anemia     iron defficiency  . Type II or unspecified type diabetes mellitus without mention of complication, uncontrolled     Dr Loanne Drilling  . Muscle pain   . Degenerative joint disease   . Atrial fibrillation or flutter     s/p RFCA 7/08;   s/p DCCV in past;   previously on amiodarone;  amio stopped due to lung toxicity  . CAD (coronary artery disease)     s/p NSTEMI tx with BMS to OM1 3/08;  cath 3/08: pOM 99% tx with PCI, pLAD 20%, ? mod stenosis at the AM  . HLD (hyperlipidemia)   . HTN (hypertension)     essential nos  . Dystrophy, corneal stromal   . Chronic diastolic heart failure     echo 11/11:  EF 55-60%, severe LVH, mod LAE, mild MR, mildly increased PASP  . Allergy     seasonal  . Cataract   . CHF (congestive heart failure)   . Myocardial infarction 2008  . Osteoporosis   . Thyroid disease     hypothyroidism    Past Surgical History  Procedure Laterality Date  . Cholecystectomy    . Knee surgery    . Eye surgery      cataract surgery & corneal implant bilaterally  . A flutter ablation      Dr Lovena Le  . Gravid 2 para 2    . Colonoscopy  6/07    2 polyps Dr. Kinnie Feil in [place]  . Bare metal stent  02/2007    Dr Lovena Le  .  Colonoscopy with polypectomy       X 2; [facility]    History   Social History  . Marital Status: Widowed    Spouse Name: N/A    Number of Children: N/A  . Years of Education: N/A   Occupational History  . Teacher    Social History Main Topics  . Smoking status: Former Smoker    Quit date: 12/27/1968  . Smokeless tobacco: Never Used     Comment: smoked [place], up to 1 pp week  . Alcohol Use: No  . Drug Use: No  . Sexual Activity: Not on file   Other Topics Concern  . Not on file   Social History Narrative   Teacher. Widowed. Rarely drinks cafeine.     Current Outpatient Prescriptions on File Prior to Visit  Medication Sig Dispense Refill  . B-D ULTRAFINE III SHORT PEN 31G X 8 MM MISC USE AS DIRECTED  100 each  3  . bacitracin ointment Apply topically 2 (two) times daily.  15 g  0  .  Calcium-Magnesium-Zinc (CAL-MAG-ZINC PO) Take 1 tablet by mouth daily.       . carvedilol (COREG) 25 MG tablet Take 50 mg by mouth 2 (two) times daily with a meal.      . carvedilol (COREG) 25 MG tablet TAKE 2 TABLETS TWICE A DAY  120 tablet  3  . Cholecalciferol (VITAMIN D3) 1000 UNITS CAPS Take 1 capsule by mouth daily.       . digoxin (LANOXIN) 0.125 MG tablet TAKE 1/2 TABLET EVERY DAY  30 tablet  3  . folic acid (FOLVITE) 673 MCG tablet Take 800 mcg by mouth daily.      Marland Kitchen glucose blood (FREESTYLE LITE) test strip Check blood sugar twice daily as directed, DX 250.02  100 each  4  . insulin regular (HUMULIN R) 100 units/mL injection Inject 0.1 mLs (10 Units total) into the skin 2 (two) times daily before a meal.  10 mL  12  . Lancets (FREESTYLE) lancets Check Blood sugar daily as directed  100 each  3  . methimazole (TAPAZOLE) 5 MG tablet Take 1 tablet (5 mg total) by mouth 3 (three) times a week. MWF  40 tablet  2  . nitroGLYCERIN (NITROSTAT) 0.4 MG SL tablet Place 0.4 mg under the tongue every 5 (five) minutes as needed. x3 doses as needed for chest pain      . Omega-3 Fatty Acids  (FISH OIL) 1200 MG CAPS Take 1 capsule by mouth daily.       . potassium chloride SA (K-DUR,KLOR-CON) 20 MEQ tablet TAKE 1 TABLET EVERY DAY  90 tablet  1  . PRADAXA 75 MG CAPS TAKE 1 CAPSULE EVERY 12 HOURS  180 capsule  1  . rosuvastatin (CRESTOR) 20 MG tablet Take 20 mg by mouth daily.      . rosuvastatin (CRESTOR) 20 MG tablet TAKE 1 TABLET BY MOUTH EVERY DAY  90 tablet  1  . tiotropium (SPIRIVA) 18 MCG inhalation capsule Place 1 capsule (18 mcg total) into inhaler and inhale daily.  30 capsule  2  . vitamin B-12 (CYANOCOBALAMIN) 1000 MCG tablet Take 1,000 mcg by mouth daily.         No current facility-administered medications on file prior to visit.    Allergies  Allergen Reactions  . Amlodipine Besylate     REACTION: tingling in lips & gum edema  . Lobster [Shellfish Allergy]     angioedema  . Penicillins     REACTION: rash  . Valsartan     REACTION: angioedema  . Codeine     Mental status changes  . Tramadol Hcl     REACTION: nausea    Family History  Problem Relation Age of Onset  . Diabetes Mother   . Hypertension Mother   . Transient ischemic attack Mother   . Heart attack Father 80  . Arthritis Father   . Breast cancer Maternal Aunt   . Arthritis Maternal Aunt   . Arthritis Mother   . Hypertension Father     BP 114/62  Pulse 62  Temp(Src) 98 F (36.7 C) (Oral)  Ht 5\' 4"  (1.626 m)  Wt 167 lb (75.751 kg)  BMI 28.65 kg/m2  SpO2 96%  Review of Systems denies hypoglycemia.  She has gained weight.    Objective:   Physical Exam VITAL SIGNS:  See vs page GENERAL: no distress  Lab Results  Component Value Date   HGBA1C 9.6* 01/21/2014      Assessment & Plan:  DM: she  needs increased rx. noncompliance with cbg's.  This limits ability to rx DM.  i'll do the best i can. Weight gain: this complicates the rx of DM

## 2014-01-21 NOTE — Telephone Encounter (Signed)
Furosemide refilled per protocol. JG//CMA 

## 2014-01-22 ENCOUNTER — Encounter: Payer: Self-pay | Admitting: Endocrinology

## 2014-01-23 ENCOUNTER — Other Ambulatory Visit: Payer: Self-pay | Admitting: Endocrinology

## 2014-01-23 ENCOUNTER — Telehealth: Payer: Self-pay

## 2014-01-23 MED ORDER — INSULIN NPH (HUMAN) (ISOPHANE) 100 UNIT/ML ~~LOC~~ SUSP: SUBCUTANEOUS | Status: DC

## 2014-01-23 MED ORDER — INSULIN REGULAR HUMAN 100 UNIT/ML IJ SOLN: 10.0000 [IU] | Freq: Two times a day (BID) | INTRAMUSCULAR | Status: DC

## 2014-01-23 NOTE — Telephone Encounter (Signed)
Relevant patient education assigned to patient using Emmi. ° °

## 2014-01-24 ENCOUNTER — Encounter: Payer: Self-pay | Admitting: Endocrinology

## 2014-01-24 NOTE — Telephone Encounter (Signed)
error 

## 2014-01-28 ENCOUNTER — Telehealth: Payer: Self-pay | Admitting: Internal Medicine

## 2014-01-28 NOTE — Telephone Encounter (Signed)
Pt request a call from the assistant concern about paper work she sent in to Dr. Linna Darner about a week ago. Pt sent in pt's assistant paper work for Spiriva over a week ago with the self stamp for Dr. Linna Darner to send back to pt once he sigh and get written Rx attach to it. Please advise.

## 2014-01-29 ENCOUNTER — Telehealth: Payer: Self-pay

## 2014-01-29 NOTE — Telephone Encounter (Signed)
LMOM asking the pt to  RTC regarding form for meds.//AB/CMA

## 2014-01-29 NOTE — Telephone Encounter (Signed)
The patient called and is need of information about a form she sent to the [facility].  She states it was for her patient assistance medication.

## 2014-01-30 ENCOUNTER — Telehealth: Payer: Self-pay | Admitting: Endocrinology

## 2014-01-30 NOTE — Telephone Encounter (Signed)
please call patient: How many units of each insulin does she take?

## 2014-01-30 NOTE — Telephone Encounter (Signed)
please call patient: Should be 22 units qam every day, and 14 units qpm, every day.

## 2014-01-30 NOTE — Telephone Encounter (Signed)
Pt states that she is taking 8 units Monday Wednesday and Friday morning; 22 units Tuesday, Thursday, Saturday, and Sunday morning and 14 units every evening.  Pt states that she takes that same amount of insulin for each injection.

## 2014-01-30 NOTE — Telephone Encounter (Signed)
Patient phoned returning Angie's call.  Needs for angie to return her call asap.   CB# [phone]

## 2014-01-31 NOTE — Telephone Encounter (Signed)
Left pt a detailed message on both phone lines giving the information.

## 2014-02-01 ENCOUNTER — Other Ambulatory Visit: Payer: Self-pay

## 2014-02-01 MED ORDER — INSULIN REGULAR HUMAN 100 UNIT/ML IJ SOLN: INTRAMUSCULAR | Status: DC

## 2014-02-01 NOTE — Telephone Encounter (Signed)
Spoke with the pt and informed her that Dr. Linna Darner has the form and he will fill it out as soon as he can, and we will let her know when it is ready.  Pt understood and agreed.//AB/CMA

## 2014-02-01 NOTE — Telephone Encounter (Signed)
Patient is calling back regarding form. Says she will be at her home # all day today.

## 2014-02-03 ENCOUNTER — Other Ambulatory Visit: Payer: Self-pay | Admitting: Internal Medicine

## 2014-02-03 MED ORDER — TIOTROPIUM BROMIDE MONOHYDRATE 18 MCG IN CAPS: 18.0000 ug | ORAL_CAPSULE | Freq: Every day | RESPIRATORY_TRACT | Status: DC

## 2014-02-07 ENCOUNTER — Telehealth: Payer: Self-pay

## 2014-02-07 NOTE — Telephone Encounter (Signed)
The patient called and stated she sent a form in the mail, and is hoping to get an update on this.  I dont see a note in the [facility].  The patient states she called and Angie stated she did receive the form.    She is hoping to get her medication refilled with this form, and is hoping to get a callback to from Angie to get more details on what is causing the delay in the refill.    Callback - [phone]

## 2014-02-11 NOTE — Telephone Encounter (Signed)
Spoke with the pt and informed her that the form was completed.//AB/CMA

## 2014-02-19 ENCOUNTER — Telehealth: Payer: Self-pay | Admitting: Internal Medicine

## 2014-02-19 NOTE — Telephone Encounter (Signed)
She hates to bother you.  She has received "the Form" but doctor did not include script for spiriva and crestor (needs written for more than 1 refill).  She wants to know if she can come by to pick up the scripts?

## 2014-02-20 MED ORDER — ROSUVASTATIN CALCIUM 20 MG PO TABS: 20.0000 mg | ORAL_TABLET | Freq: Every day | ORAL | Status: DC

## 2014-02-20 MED ORDER — TIOTROPIUM BROMIDE MONOHYDRATE 18 MCG IN CAPS: 18.0000 ug | ORAL_CAPSULE | Freq: Every day | RESPIRATORY_TRACT | Status: DC

## 2014-02-20 NOTE — Telephone Encounter (Signed)
Spoke with the pt and informed her that the rx's will be printed and will be placed up front for her to pick-up.//AB/CMA

## 2014-03-01 ENCOUNTER — Encounter: Payer: Self-pay | Admitting: Endocrinology

## 2014-03-01 ENCOUNTER — Other Ambulatory Visit: Payer: Self-pay

## 2014-03-01 MED ORDER — CARVEDILOL 25 MG PO TABS: 50.0000 mg | ORAL_TABLET | Freq: Two times a day (BID) | ORAL | Status: DC

## 2014-03-04 ENCOUNTER — Encounter: Payer: Self-pay | Admitting: Endocrinology

## 2014-03-13 ENCOUNTER — Other Ambulatory Visit: Payer: Self-pay

## 2014-03-13 MED ORDER — CARVEDILOL 25 MG PO TABS
50.0000 mg | ORAL_TABLET | Freq: Two times a day (BID) | ORAL | Status: DC
Start: 2014-03-13 — End: 2014-05-15

## 2014-03-18 ENCOUNTER — Telehealth: Payer: Self-pay | Admitting: Internal Medicine

## 2014-03-18 NOTE — Telephone Encounter (Signed)
New Message:  Pt is c/o chest pain... States she last had it 2 days ago... Pt is also c/o SOB.. States she is having it now.Marland KitchenMarland Kitchen

## 2014-03-18 NOTE — Telephone Encounter (Signed)
Patient called and complained of chest tightness on Friday at 3 am relieved with 1 NTG. Saturday she felt a little weak but no chest tightness. Sunday she felt OK. Mentioned feeling SOB times 1 month with ambulation. Weight is up to 160 pounds. Advised her to take an extra lasix and potassium today and tomorrow ( as instructed in Dr.Taylor's last note) with daily weights and appointment made to see Dr.Taylor on 3/25. She was advised to call 911 with recurrent chest tightness requiring 2 or more nitroglycerin. Patient verbalized understanding.

## 2014-03-20 ENCOUNTER — Ambulatory Visit (INDEPENDENT_AMBULATORY_CARE_PROVIDER_SITE_OTHER): Payer: Medicare Other | Admitting: Internal Medicine

## 2014-03-20 ENCOUNTER — Encounter: Payer: Self-pay | Admitting: Internal Medicine

## 2014-03-20 VITALS — BP 155/72 | HR 68 | Ht 64.0 in | Wt 171.0 lb

## 2014-03-20 DIAGNOSIS — I509 Heart failure, unspecified: Secondary | ICD-10-CM

## 2014-03-20 DIAGNOSIS — R0602 Shortness of breath: Secondary | ICD-10-CM

## 2014-03-20 DIAGNOSIS — R079 Chest pain, unspecified: Secondary | ICD-10-CM

## 2014-03-20 MED ORDER — FUROSEMIDE 40 MG PO TABS: 40.0000 mg | ORAL_TABLET | Freq: Two times a day (BID) | ORAL | Status: DC

## 2014-03-20 MED ORDER — POTASSIUM CHLORIDE CRYS ER 20 MEQ PO TBCR: 20.0000 meq | EXTENDED_RELEASE_TABLET | Freq: Two times a day (BID) | ORAL | Status: DC

## 2014-03-20 NOTE — Patient Instructions (Addendum)
Your physician recommends that you schedule a follow-up appointment in: 6 weeks with Dr Lovena Le   Your physician has recommended you make the following change in your medication:  1) Increase Furosemide to 2 tablets daily 2) Increase Potassium to 2 tablets daily  Your physician has requested that you have an echocardiogram. Echocardiography is a painless test that uses sound waves to create images of your heart. It provides your doctor with information about the size and shape of your heart and how well your heart's chambers and valves are working. This procedure takes approximately one hour. There are no restrictions for this procedure.

## 2014-03-21 ENCOUNTER — Encounter: Payer: Self-pay | Admitting: Internal Medicine

## 2014-03-21 ENCOUNTER — Ambulatory Visit (HOSPITAL_COMMUNITY): Payer: Medicare Other | Attending: Internal Medicine | Admitting: Radiology

## 2014-03-21 DIAGNOSIS — I509 Heart failure, unspecified: Secondary | ICD-10-CM | POA: Insufficient documentation

## 2014-03-21 DIAGNOSIS — I4892 Unspecified atrial flutter: Secondary | ICD-10-CM | POA: Insufficient documentation

## 2014-03-21 DIAGNOSIS — I2510 Atherosclerotic heart disease of native coronary artery without angina pectoris: Secondary | ICD-10-CM | POA: Insufficient documentation

## 2014-03-21 DIAGNOSIS — R0602 Shortness of breath: Secondary | ICD-10-CM

## 2014-03-21 DIAGNOSIS — I4891 Unspecified atrial fibrillation: Secondary | ICD-10-CM | POA: Insufficient documentation

## 2014-03-21 NOTE — Progress Notes (Signed)
Echocardiogram performed.  

## 2014-03-22 ENCOUNTER — Encounter: Payer: Self-pay | Admitting: Internal Medicine

## 2014-03-22 NOTE — Progress Notes (Signed)
HPI [name] returns today for followup. 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 In the interim, the patient has had worsening sob. She denies chest pain or dietary indiscretion. No syncope. She does note some peripheral edema. Allergies  Allergen Reactions  . Amlodipine Besylate     REACTION: tingling in lips & gum edema  . Lobster [Shellfish Allergy]     angioedema  . Penicillins     REACTION: rash  . Valsartan     REACTION: angioedema  . Codeine     Mental status changes  . Tramadol Hcl     REACTION: nausea     Current Outpatient Prescriptions  Medication Sig Dispense Refill  . B-D ULTRAFINE III SHORT PEN 31G X 8 MM MISC USE AS DIRECTED  100 each  3  . bacitracin ointment Apply topically 2 (two) times daily.  15 g  0  . Calcium-Magnesium-Zinc (CAL-MAG-ZINC PO) Take 1 tablet by mouth daily.       . carvedilol (COREG) 25 MG tablet Take 2 tablets (50 mg total) by mouth 2 (two) times daily with a meal.  360 tablet  1  . Cholecalciferol (VITAMIN D3) 1000 UNITS CAPS Take 1 capsule by mouth daily.       . digoxin (LANOXIN) 0.125 MG tablet TAKE 1/2 TABLET EVERY DAY  30 tablet  3  . folic acid (FOLVITE) 716 MCG tablet Take 800 mcg by mouth daily.      . furosemide (LASIX) 40 MG tablet Take 1 tablet (40 mg total) by mouth 2 (two) times daily.  180 tablet  3  . glucose blood (FREESTYLE LITE) test strip Check blood sugar twice daily as directed, DX 250.02  100 each  4  . insulin NPH Human (HUMULIN N,NOVOLIN N) 100 UNIT/ML injection 22 units in the morning (8 if you are going to be active), and 14 units in the evening.  50 mL  3  . insulin regular (HUMULIN R) 100 units/mL injection Inject 22 units into the skin each morning (8 if active). Inject 14 units into the skin each evening.  30 mL  3  . Lancets  (FREESTYLE) lancets Check Blood sugar daily as directed  100 each  3  . methimazole (TAPAZOLE) 5 MG tablet Take 1 tablet (5 mg total) by mouth 3 (three) times a week. MWF  40 tablet  2  . nitroGLYCERIN (NITROSTAT) 0.4 MG SL tablet Place 0.4 mg under the tongue every 5 (five) minutes as needed. x3 doses as needed for chest pain      . Omega-3 Fatty Acids (FISH OIL) 1200 MG CAPS Take 1 capsule by mouth daily.       . potassium chloride SA (K-DUR,KLOR-CON) 20 MEQ tablet Take 1 tablet (20 mEq total) by mouth 2 (two) times daily.  180 tablet  3  . PRADAXA 75 MG CAPS TAKE 1 CAPSULE EVERY 12 HOURS  180 capsule  1  . rosuvastatin (CRESTOR) 20 MG tablet TAKE 1 TABLET BY MOUTH EVERY DAY  90 tablet  1  . rosuvastatin (CRESTOR) 20 MG tablet Take 1 tablet (20 mg total) by mouth daily.  90 tablet  1  . tiotropium (SPIRIVA) 18 MCG inhalation capsule Place 1 capsule (18 mcg total) into inhaler and inhale daily.  90 capsule  1  . vitamin B-12 (CYANOCOBALAMIN) 1000 MCG tablet  Take 1,000 mcg by mouth daily.         No current facility-administered medications for this visit.     Past Medical History  Diagnosis Date  . Hypopotassemia     PMH of  . Anemia     iron defficiency  . Type II or unspecified type diabetes mellitus without mention of complication, uncontrolled     Dr Loanne Drilling  . Muscle pain   . Degenerative joint disease   . Atrial fibrillation or flutter     s/p RFCA 7/08;   s/p DCCV in past;   previously on amiodarone;  amio stopped due to lung toxicity  . CAD (coronary artery disease)     s/p NSTEMI tx with BMS to OM1 3/08;  cath 3/08: pOM 99% tx with PCI, pLAD 20%, ? mod stenosis at the AM  . HLD (hyperlipidemia)   . HTN (hypertension)     essential nos  . Dystrophy, corneal stromal   . Chronic diastolic heart failure     echo 11/11:  EF 55-60%, severe LVH, mod LAE, mild MR, mildly increased PASP  . Allergy     seasonal  . Cataract   . CHF (congestive heart failure)   . Myocardial  infarction 2008  . Osteoporosis   . Thyroid disease     hypothyroidism    ROS:   All systems reviewed and negative except as noted in the HPI.   Past Surgical History  Procedure Laterality Date  . Cholecystectomy    . Knee surgery    . Eye surgery      cataract surgery & corneal implant bilaterally  . A flutter ablation      Dr Lovena Le  . Gravid 2 para 2    . Colonoscopy  6/07    2 polyps Dr. Kinnie Feil in [place]  . Bare metal stent  02/2007    Dr Lovena Le  . Colonoscopy with polypectomy       X 2; [facility]     Family History  Problem Relation Age of Onset  . Diabetes Mother   . Hypertension Mother   . Transient ischemic attack Mother   . Heart attack Father 46  . Arthritis Father   . Breast cancer Maternal Aunt   . Arthritis Maternal Aunt   . Arthritis Mother   . Hypertension Father      History   Social History  . Marital Status: Widowed    Spouse Name: N/A    Number of Children: N/A  . Years of Education: N/A   Occupational History  . Teacher    Social History Main Topics  . Smoking status: Former Smoker    Quit date: 12/27/1968  . Smokeless tobacco: Never Used     Comment: smoked [place], up to 1 pp week  . Alcohol Use: No  . Drug Use: No  . Sexual Activity: Not on file   Other Topics Concern  . Not on file   Social History Narrative   Teacher. Widowed. Rarely drinks cafeine.      BP 155/72  Pulse 68  Ht 5\' 4"  (1.626 m)  Wt 171 lb (77.565 kg)  BMI 29.34 kg/m2  Physical Exam:  stable appearing 80 year old woman, who looks younger than her stated age, NAD HEENT: Unremarkable Neck:  7 cm JVD, no thyromegally Back:  No CVA tenderness Lungs:  Clear with rales in the bases, no rhonchi. No increased work of breathing. HEART:  IRegular rate rhythm, no murmurs, no  rubs, no clicks Abd:  soft, positive bowel sounds, no organomegally, no rebound, no guarding Ext:  2 plus pulses, no edema, no cyanosis, no clubbing Skin:  No rashes no  nodules Neuro:  CN II through XII intact, motor grossly intact  EKG - atrial fib with a controlled ventricular response.    Assess/Plan:

## 2014-03-22 NOTE — Assessment & Plan Note (Addendum)
The patient appears to have worsening LV dysfunction or at least worsening CHF symptoms. The etiology is unclear. She has had some dietary indiscretion. Will obtain a 2D echo. Additional studies will be requested based on the results of her echo. In addition, will ask the patient to increase her dose of diuretic and potassium.

## 2014-03-25 ENCOUNTER — Other Ambulatory Visit (INDEPENDENT_AMBULATORY_CARE_PROVIDER_SITE_OTHER): Payer: Medicare Other

## 2014-03-25 DIAGNOSIS — R0602 Shortness of breath: Secondary | ICD-10-CM

## 2014-03-25 DIAGNOSIS — I509 Heart failure, unspecified: Secondary | ICD-10-CM

## 2014-03-26 ENCOUNTER — Encounter: Payer: Self-pay | Admitting: Internal Medicine

## 2014-03-26 LAB — BASIC METABOLIC PANEL
BUN: 35 mg/dL — ABNORMAL HIGH (ref 6–23)
CHLORIDE: 100 meq/L (ref 96–112)
CO2: 30 meq/L (ref 19–32)
CREATININE: 1.2 mg/dL (ref 0.4–1.2)
Calcium: 9.7 mg/dL (ref 8.4–10.5)
GFR: 53.48 mL/min — ABNORMAL LOW (ref >60.00)
Glucose, Bld: 111 mg/dL — ABNORMAL HIGH (ref 70–99)
Potassium: 4 mEq/L (ref 3.5–5.1)
Sodium: 139 mEq/L (ref 135–145)

## 2014-03-27 ENCOUNTER — Other Ambulatory Visit: Payer: Self-pay | Admitting: Endocrinology

## 2014-04-01 ENCOUNTER — Encounter: Payer: Self-pay | Admitting: Internal Medicine

## 2014-04-02 ENCOUNTER — Other Ambulatory Visit: Payer: Self-pay | Admitting: *Deleted

## 2014-04-02 DIAGNOSIS — R0602 Shortness of breath: Secondary | ICD-10-CM

## 2014-04-08 NOTE — Telephone Encounter (Signed)
Close encounter 

## 2014-05-02 ENCOUNTER — Encounter: Payer: Self-pay | Admitting: Internal Medicine

## 2014-05-02 ENCOUNTER — Ambulatory Visit (INDEPENDENT_AMBULATORY_CARE_PROVIDER_SITE_OTHER): Payer: Medicare Other | Admitting: Internal Medicine

## 2014-05-02 ENCOUNTER — Encounter: Payer: Self-pay | Admitting: *Deleted

## 2014-05-02 VITALS — BP 144/59 | HR 47 | Ht 64.0 in | Wt 172.4 lb

## 2014-05-02 DIAGNOSIS — I200 Unstable angina: Secondary | ICD-10-CM

## 2014-05-02 DIAGNOSIS — R0789 Other chest pain: Secondary | ICD-10-CM

## 2014-05-02 DIAGNOSIS — I4891 Unspecified atrial fibrillation: Secondary | ICD-10-CM

## 2014-05-02 LAB — CBC WITH DIFFERENTIAL/PLATELET
Basophils Absolute: 0 10*3/uL (ref 0.0–0.1)
Basophils Relative: 0.4 % (ref 0.0–3.0)
EOS ABS: 0.5 10*3/uL (ref 0.0–0.7)
Eosinophils Relative: 9.7 % — ABNORMAL HIGH (ref 0.0–5.0)
HCT: 38.7 % (ref 36.0–46.0)
Hemoglobin: 12.1 g/dL (ref 12.0–15.0)
Lymphocytes Relative: 20.2 % (ref 12.0–46.0)
Lymphs Abs: 1 10*3/uL (ref 0.7–4.0)
MCHC: 31.3 g/dL (ref 30.0–36.0)
MCV: 77.7 fl — AB (ref 78.0–100.0)
Monocytes Absolute: 0.6 10*3/uL (ref 0.1–1.0)
Monocytes Relative: 11.4 % (ref 3.0–12.0)
NEUTROS PCT: 58.3 % (ref 43.0–77.0)
Neutro Abs: 2.9 10*3/uL (ref 1.4–7.7)
PLATELETS: 194 10*3/uL (ref 150.0–400.0)
RBC: 4.99 Mil/uL (ref 3.87–5.11)
RDW: 18.2 % — ABNORMAL HIGH (ref 11.5–15.5)
WBC: 4.9 10*3/uL (ref 4.0–10.5)

## 2014-05-02 LAB — BASIC METABOLIC PANEL
BUN: 23 mg/dL (ref 6–23)
CO2: 28 mEq/L (ref 19–32)
Calcium: 9.4 mg/dL (ref 8.4–10.5)
Chloride: 103 mEq/L (ref 96–112)
Creatinine, Ser: 1.1 mg/dL (ref 0.4–1.2)
GFR: 58.91 mL/min — AB (ref >60.00)
Glucose, Bld: 162 mg/dL — ABNORMAL HIGH (ref 70–99)
POTASSIUM: 4 meq/L (ref 3.5–5.1)
SODIUM: 138 meq/L (ref 135–145)

## 2014-05-02 LAB — PROTIME-INR
INR: 1.5 ratio — ABNORMAL HIGH (ref 0.8–1.0)
Prothrombin Time: 17 s — ABNORMAL HIGH (ref 9.6–13.1)

## 2014-05-02 MED ORDER — NITROGLYCERIN 0.4 MG SL SUBL: 0.4000 mg | SUBLINGUAL_TABLET | SUBLINGUAL | Status: DC | PRN

## 2014-05-02 NOTE — Assessment & Plan Note (Signed)
Her symptoms have worsened and appear to be very similar to what she experienced when she had [place] 7 years ago. I have given her a prescription for ntg. She will undergo heart catheterization early next week. She is instructed to go to the ER if she has any rest symptoms. She will hold her pradaxa 2 days prior to cath.

## 2014-05-02 NOTE — Progress Notes (Signed)
HPI [name] returns today for followup. She is a pleasant 80 yo woman with CAD, HTN, atrial fibrillation/ LA flutter now chronic, prior systolic heart failure, which was likely rate related, and diastolic heart failure. She was seen by me several weeks ago with worsening dyspnea and underwent 2D echo which actually demonstrated improved LV function, now normal. She is pending PFT's with a DLCO. Since then she feels worse and has begun experiencing chest pressure with exertion. She has known CAD and her symptoms were very similar when she presented 7 years ago with [place] and a 99% left cx lesion which was treated successfully. Today she notes that with any exertion she experiences shortness of breath and chest heaviness. She does not have syncope. No rest symptoms. Her ECG demonstrates lateral ST depression. Allergies  Allergen Reactions  . Amlodipine Besylate     REACTION: tingling in lips & gum edema  . Lobster [Shellfish Allergy]     angioedema  . Penicillins     REACTION: rash  . Valsartan     REACTION: angioedema  . Codeine     Mental status changes  . Tramadol Hcl     REACTION: nausea     Current Outpatient Prescriptions  Medication Sig Dispense Refill  . Calcium-Magnesium-Zinc (CAL-MAG-ZINC PO) Take 1 tablet by mouth daily.       . carvedilol (COREG) 25 MG tablet Take 2 tablets (50 mg total) by mouth 2 (two) times daily with a meal.  360 tablet  1  . Cholecalciferol (VITAMIN D3) 1000 UNITS CAPS Take 1 capsule by mouth daily.       . digoxin (LANOXIN) 0.125 MG tablet TAKE 1/2 TABLET EVERY DAY  30 tablet  3  . folic acid (FOLVITE) 161 MCG tablet Take 800 mcg by mouth daily.      . furosemide (LASIX) 40 MG tablet Take 1 tablet (40 mg total) by mouth 2 (two) times daily.  180 tablet  3  . insulin NPH Human (HUMULIN N,NOVOLIN N) 100 UNIT/ML injection 22 units in the morning (8 if you are going to be active), and 14 units in the evening.  50 mL  3  . methimazole (TAPAZOLE) 5  MG tablet TAKE 1 TABLET 3 TIMES A WEEK ON MONDAY, WEDNESDAY AND FRIDAY  40 tablet  2  . nitroGLYCERIN (NITROSTAT) 0.4 MG SL tablet Place 0.4 mg under the tongue every 5 (five) minutes as needed. x3 doses as needed for chest pain      . Omega-3 Fatty Acids (FISH OIL) 1200 MG CAPS Take 1 capsule by mouth daily.       . potassium chloride SA (K-DUR,KLOR-CON) 20 MEQ tablet Take 1 tablet (20 mEq total) by mouth 2 (two) times daily.  180 tablet  3  . PRADAXA 75 MG CAPS TAKE 1 CAPSULE EVERY 12 HOURS  180 capsule  1  . rosuvastatin (CRESTOR) 20 MG tablet Take 1 tablet (20 mg total) by mouth daily.  90 tablet  1  . tiotropium (SPIRIVA) 18 MCG inhalation capsule Place 1 capsule (18 mcg total) into inhaler and inhale daily.  90 capsule  1  . vitamin B-12 (CYANOCOBALAMIN) 1000 MCG tablet Take 1,000 mcg by mouth daily.         No current facility-administered medications for this visit.     Past Medical History  Diagnosis Date  . Hypopotassemia     PMH of  . Anemia     iron defficiency  . Type  II or unspecified type diabetes mellitus without mention of complication, uncontrolled     Dr Loanne Drilling  . Muscle pain   . Degenerative joint disease   . Atrial fibrillation or flutter     s/p RFCA 7/08;   s/p DCCV in past;   previously on amiodarone;  amio stopped due to lung toxicity  . CAD (coronary artery disease)     s/p NSTEMI tx with BMS to OM1 3/08;  cath 3/08: pOM 99% tx with PCI, pLAD 20%, ? mod stenosis at the AM  . HLD (hyperlipidemia)   . HTN (hypertension)     essential nos  . Dystrophy, corneal stromal   . Chronic diastolic heart failure     echo 11/11:  EF 55-60%, severe LVH, mod LAE, mild MR, mildly increased PASP  . Allergy     seasonal  . Cataract   . CHF (congestive heart failure)   . Myocardial infarction 2008  . Osteoporosis   . Thyroid disease     hypothyroidism    ROS:   All systems reviewed and negative except as noted in the HPI.   Past Surgical History  Procedure  Laterality Date  . Cholecystectomy    . Knee surgery    . Eye surgery      cataract surgery & corneal implant bilaterally  . A flutter ablation      Dr Lovena Le  . Gravid 2 para 2    . Colonoscopy  6/07    2 polyps Dr. Kinnie Feil in [place]  . Bare metal stent  02/2007    Dr Lovena Le  . Colonoscopy with polypectomy       X 2; [facility]     Family History  Problem Relation Age of Onset  . Diabetes Mother   . Hypertension Mother   . Transient ischemic attack Mother   . Heart attack Father 39  . Arthritis Father   . Breast cancer Maternal Aunt   . Arthritis Maternal Aunt   . Arthritis Mother   . Hypertension Father      History   Social History  . Marital Status: Widowed    Spouse Name: N/A    Number of Children: N/A  . Years of Education: N/A   Occupational History  . Teacher    Social History Main Topics  . Smoking status: Former Smoker    Quit date: 12/27/1968  . Smokeless tobacco: Never Used     Comment: smoked [place], up to 1 pp week  . Alcohol Use: No  . Drug Use: No  . Sexual Activity: Not on file   Other Topics Concern  . Not on file   Social History Narrative   Teacher. Widowed. Rarely drinks cafeine.      BP 144/59  Pulse 47  Ht 5\' 4"  (1.626 m)  Wt 172 lb 6.4 oz (78.2 kg)  BMI 29.58 kg/m2  Physical Exam:  Well appearing elderly woman, NAD HEENT: Unremarkable Neck:  No JVD, no thyromegally Back:  No CVA tenderness Lungs:  Clear with minimal basilar rales HEART:  Regular rate rhythm, no murmurs, no rubs, no clicks Abd:  soft, positive bowel sounds, no organomegally, no rebound, no guarding Ext:  2 plus pulses, no edema, no cyanosis, no clubbing Skin:  No rashes no nodules Neuro:  CN II through XII intact, motor grossly intact  EKG - atrial flutter with a CVR, lateral ST depresion   Assess/Plan:

## 2014-05-02 NOTE — Patient Instructions (Addendum)
Your physician has requested that you have a cardiac catheterization. Cardiac catheterization is used to diagnose and/or treat various heart conditions. Doctors may recommend this procedure for a number of different reasons. The most common reason is to evaluate chest pain. Chest pain can be a symptom of coronary artery disease (CAD), and cardiac catheterization can show whether plaque is narrowing or blocking your heart's arteries. This procedure is also used to evaluate the valves, as well as measure the blood flow and oxygen levels in different parts of your heart. For further information please visit [email or web address]. Please follow instruction sheet, as given.  Your physician recommends that you have lab work today:  Bmp, cbc, pt/inr We will call you with your lab results  NTG has been prescribed for you.

## 2014-05-04 ENCOUNTER — Encounter: Payer: Self-pay | Admitting: Internal Medicine

## 2014-05-06 ENCOUNTER — Encounter (HOSPITAL_COMMUNITY): Payer: Self-pay | Admitting: Pharmacy Technician

## 2014-05-06 ENCOUNTER — Telehealth: Payer: Self-pay | Admitting: *Deleted

## 2014-05-06 NOTE — Telephone Encounter (Signed)
Called patient with preop cath lab results.

## 2014-05-07 ENCOUNTER — Ambulatory Visit (HOSPITAL_COMMUNITY)
Admission: RE | Admit: 2014-05-07 | Discharge: 2014-05-07 | Disposition: A | Discharge status: Home / Self Care | Payer: Medicare Other | Source: Ambulatory Visit | Attending: Interventional Cardiology | Admitting: Interventional Cardiology

## 2014-05-07 ENCOUNTER — Encounter (HOSPITAL_COMMUNITY)
Admission: RE | Disposition: A | Discharge status: Home / Self Care | Payer: Self-pay | Source: Ambulatory Visit | Attending: Interventional Cardiology

## 2014-05-07 DIAGNOSIS — I2510 Atherosclerotic heart disease of native coronary artery without angina pectoris: Secondary | ICD-10-CM

## 2014-05-07 DIAGNOSIS — I2789 Other specified pulmonary heart diseases: Secondary | ICD-10-CM | POA: Insufficient documentation

## 2014-05-07 DIAGNOSIS — I279 Pulmonary heart disease, unspecified: Secondary | ICD-10-CM

## 2014-05-07 DIAGNOSIS — I10 Essential (primary) hypertension: Secondary | ICD-10-CM | POA: Insufficient documentation

## 2014-05-07 DIAGNOSIS — I509 Heart failure, unspecified: Secondary | ICD-10-CM | POA: Insufficient documentation

## 2014-05-07 DIAGNOSIS — E119 Type 2 diabetes mellitus without complications: Secondary | ICD-10-CM | POA: Insufficient documentation

## 2014-05-07 DIAGNOSIS — E785 Hyperlipidemia, unspecified: Secondary | ICD-10-CM | POA: Insufficient documentation

## 2014-05-07 DIAGNOSIS — I5030 Unspecified diastolic (congestive) heart failure: Secondary | ICD-10-CM | POA: Insufficient documentation

## 2014-05-07 DIAGNOSIS — I200 Unstable angina: Secondary | ICD-10-CM

## 2014-05-07 DIAGNOSIS — Z9861 Coronary angioplasty status: Secondary | ICD-10-CM | POA: Insufficient documentation

## 2014-05-07 DIAGNOSIS — R0789 Other chest pain: Secondary | ICD-10-CM

## 2014-05-07 DIAGNOSIS — I4891 Unspecified atrial fibrillation: Secondary | ICD-10-CM | POA: Insufficient documentation

## 2014-05-07 DIAGNOSIS — Z87891 Personal history of nicotine dependence: Secondary | ICD-10-CM | POA: Insufficient documentation

## 2014-05-07 HISTORY — PX: LEFT AND RIGHT HEART CATHETERIZATION WITH CORONARY ANGIOGRAM: SHX5449

## 2014-05-07 LAB — POCT I-STAT 3, VENOUS BLOOD GAS (G3P V)
Acid-Base Excess: 2 mmol/L (ref 0.0–2.0)
Bicarbonate: 29.5 mEq/L — ABNORMAL HIGH (ref 20.0–24.0)
O2 SAT: 69 %
PCO2 VEN: 56.3 mmHg — AB (ref 45.0–50.0)
TCO2: 31 mmol/L (ref 0–100)
pH, Ven: 7.327 — ABNORMAL HIGH (ref 7.250–7.300)
pO2, Ven: 39 mmHg (ref 30.0–45.0)

## 2014-05-07 LAB — POCT I-STAT 3, ART BLOOD GAS (G3+)
ACID-BASE EXCESS: 3 mmol/L — AB (ref 0.0–2.0)
BICARBONATE: 28.9 meq/L — AB (ref 20.0–24.0)
O2 SAT: 99 %
PO2 ART: 136 mmHg — AB (ref 80.0–100.0)
TCO2: 30 mmol/L (ref 0–100)
pCO2 arterial: 49.2 mmHg — ABNORMAL HIGH (ref 35.0–45.0)
pH, Arterial: 7.377 (ref 7.350–7.450)

## 2014-05-07 LAB — GLUCOSE, CAPILLARY
GLUCOSE-CAPILLARY: 145 mg/dL — AB (ref 70–99)
Glucose-Capillary: 159 mg/dL — ABNORMAL HIGH (ref 70–99)

## 2014-05-07 SURGERY — LEFT AND RIGHT HEART CATHETERIZATION WITH CORONARY ANGIOGRAM
Anesthesia: LOCAL

## 2014-05-07 MED ORDER — VERAPAMIL HCL 2.5 MG/ML IV SOLN
INTRAVENOUS | Status: AC
  Filled 2014-05-07: qty 2

## 2014-05-07 MED ORDER — SODIUM CHLORIDE 0.9 % IJ SOLN: 3.0000 mL | Freq: Two times a day (BID) | INTRAMUSCULAR | Status: DC

## 2014-05-07 MED ORDER — SODIUM CHLORIDE 0.9 % IJ SOLN: 3.0000 mL | INTRAMUSCULAR | Status: DC | PRN

## 2014-05-07 MED ORDER — LIDOCAINE HCL (PF) 1 % IJ SOLN
INTRAMUSCULAR | Status: AC
  Filled 2014-05-07: qty 30

## 2014-05-07 MED ORDER — NITROGLYCERIN 0.2 MG/ML ON CALL CATH LAB
INTRAVENOUS | Status: AC
  Filled 2014-05-07: qty 1

## 2014-05-07 MED ORDER — FENTANYL CITRATE 0.05 MG/ML IJ SOLN
INTRAMUSCULAR | Status: AC
  Filled 2014-05-07: qty 2

## 2014-05-07 MED ORDER — HEPARIN SODIUM (PORCINE) 1000 UNIT/ML IJ SOLN
INTRAMUSCULAR | Status: AC
  Filled 2014-05-07: qty 1

## 2014-05-07 MED ORDER — SODIUM CHLORIDE 0.9 % IV SOLN: 1.0000 mL/kg/h | INTRAVENOUS | Status: DC

## 2014-05-07 MED ORDER — HEPARIN (PORCINE) IN NACL 2-0.9 UNIT/ML-% IJ SOLN
INTRAMUSCULAR | Status: AC
  Filled 2014-05-07: qty 1500

## 2014-05-07 MED ORDER — SODIUM CHLORIDE 0.9 % IV SOLN: 250.0000 mL | INTRAVENOUS | Status: DC | PRN

## 2014-05-07 MED ORDER — MIDAZOLAM HCL 2 MG/2ML IJ SOLN
INTRAMUSCULAR | Status: AC
  Filled 2014-05-07: qty 2

## 2014-05-07 MED ORDER — ASPIRIN 81 MG PO CHEW
81.0000 mg | CHEWABLE_TABLET | ORAL | Status: AC
Start: 2014-05-08 — End: 2014-05-07
  Administered 2014-05-07: 81 mg via ORAL
  Filled 2014-05-07: qty 1

## 2014-05-07 MED ORDER — SODIUM CHLORIDE 0.9 % IV SOLN: INTRAVENOUS | Status: DC

## 2014-05-07 NOTE — Discharge Instructions (Addendum)
Resume Pradaxa tomorrow May 08, 2014.  Radial Site Care  Refer to this sheet in the next few weeks. These instructions provide you with information on caring for yourself after your procedure. Your caregiver may also give you more specific instructions. Your treatment has been planned according to current medical practices, but problems sometimes occur. Call your caregiver if you have any problems or questions after your procedure. HOME CARE INSTRUCTIONS  You may shower the day after the procedure.Remove the bandage (dressing) and gently wash the site with plain soap and water.Gently pat the site dry.  Do not apply powder or lotion to the site.  Do not submerge the affected site in water for 3 to 5 days.  Inspect the site at least twice daily.  Do not flex or bend the affected arm for 24 hours.  No lifting over 5 pounds (2.3 kg) for 5 days after your procedure.  Do not drive home if you are discharged the same day of the procedure. Have someone else drive you.  You may drive 24 hours after the procedure unless otherwise instructed by your caregiver.  Do not operate machinery or power tools for 24 hours.  A responsible adult should be with you for the first 24 hours after you arrive home. What to expect:  Any bruising will usually fade within 1 to 2 weeks.  Blood that collects in the tissue (hematoma) may be painful to the touch. It should usually decrease in size and tenderness within 1 to 2 weeks. SEEK IMMEDIATE MEDICAL CARE IF:  You have unusual pain at the radial site.  You have redness, warmth, swelling, or pain at the radial site.  You have drainage (other than a small amount of blood on the dressing).  You have chills.  You have a fever or persistent symptoms for more than 72 hours.  You have a fever and your symptoms suddenly get worse.  Your arm becomes pale, cool, tingly, or numb.  You have heavy bleeding from the site. Hold pressure on the site. Document  Released: 01/15/2011 Document Revised: 03/06/2012 Document Reviewed: 01/15/2011 [facility] Patient Information 2014 [place], [place].

## 2014-05-07 NOTE — H&P (View-Only) (Signed)
HPI [name] returns today for followup. She is a pleasant 80 yo woman with CAD, HTN, atrial fibrillation/ LA flutter now chronic, prior systolic heart failure, which was likely rate related, and diastolic heart failure. She was seen by me several weeks ago with worsening dyspnea and underwent 2D echo which actually demonstrated improved LV function, now normal. She is pending PFT's with a DLCO. Since then she feels worse and has begun experiencing chest pressure with exertion. She has known CAD and her symptoms were very similar when she presented 7 years ago with [place] and a 99% left cx lesion which was treated successfully. Today she notes that with any exertion she experiences shortness of breath and chest heaviness. She does not have syncope. No rest symptoms. Her ECG demonstrates lateral ST depression. Allergies  Allergen Reactions  . Amlodipine Besylate     REACTION: tingling in lips & gum edema  . Lobster [Shellfish Allergy]     angioedema  . Penicillins     REACTION: rash  . Valsartan     REACTION: angioedema  . Codeine     Mental status changes  . Tramadol Hcl     REACTION: nausea     Current Outpatient Prescriptions  Medication Sig Dispense Refill  . Calcium-Magnesium-Zinc (CAL-MAG-ZINC PO) Take 1 tablet by mouth daily.       . carvedilol (COREG) 25 MG tablet Take 2 tablets (50 mg total) by mouth 2 (two) times daily with a meal.  360 tablet  1  . Cholecalciferol (VITAMIN D3) 1000 UNITS CAPS Take 1 capsule by mouth daily.       . digoxin (LANOXIN) 0.125 MG tablet TAKE 1/2 TABLET EVERY DAY  30 tablet  3  . folic acid (FOLVITE) 818 MCG tablet Take 800 mcg by mouth daily.      . furosemide (LASIX) 40 MG tablet Take 1 tablet (40 mg total) by mouth 2 (two) times daily.  180 tablet  3  . insulin NPH Human (HUMULIN N,NOVOLIN N) 100 UNIT/ML injection 22 units in the morning (8 if you are going to be active), and 14 units in the evening.  50 mL  3  . methimazole (TAPAZOLE) 5  MG tablet TAKE 1 TABLET 3 TIMES A WEEK ON MONDAY, WEDNESDAY AND FRIDAY  40 tablet  2  . nitroGLYCERIN (NITROSTAT) 0.4 MG SL tablet Place 0.4 mg under the tongue every 5 (five) minutes as needed. x3 doses as needed for chest pain      . Omega-3 Fatty Acids (FISH OIL) 1200 MG CAPS Take 1 capsule by mouth daily.       . potassium chloride SA (K-DUR,KLOR-CON) 20 MEQ tablet Take 1 tablet (20 mEq total) by mouth 2 (two) times daily.  180 tablet  3  . PRADAXA 75 MG CAPS TAKE 1 CAPSULE EVERY 12 HOURS  180 capsule  1  . rosuvastatin (CRESTOR) 20 MG tablet Take 1 tablet (20 mg total) by mouth daily.  90 tablet  1  . tiotropium (SPIRIVA) 18 MCG inhalation capsule Place 1 capsule (18 mcg total) into inhaler and inhale daily.  90 capsule  1  . vitamin B-12 (CYANOCOBALAMIN) 1000 MCG tablet Take 1,000 mcg by mouth daily.         No current facility-administered medications for this visit.     Past Medical History  Diagnosis Date  . Hypopotassemia     PMH of  . Anemia     iron defficiency  . Type  II or unspecified type diabetes mellitus without mention of complication, uncontrolled     Dr Loanne Drilling  . Muscle pain   . Degenerative joint disease   . Atrial fibrillation or flutter     s/p RFCA 7/08;   s/p DCCV in past;   previously on amiodarone;  amio stopped due to lung toxicity  . CAD (coronary artery disease)     s/p NSTEMI tx with BMS to OM1 3/08;  cath 3/08: pOM 99% tx with PCI, pLAD 20%, ? mod stenosis at the AM  . HLD (hyperlipidemia)   . HTN (hypertension)     essential nos  . Dystrophy, corneal stromal   . Chronic diastolic heart failure     echo 11/11:  EF 55-60%, severe LVH, mod LAE, mild MR, mildly increased PASP  . Allergy     seasonal  . Cataract   . CHF (congestive heart failure)   . Myocardial infarction 2008  . Osteoporosis   . Thyroid disease     hypothyroidism    ROS:   All systems reviewed and negative except as noted in the HPI.   Past Surgical History  Procedure  Laterality Date  . Cholecystectomy    . Knee surgery    . Eye surgery      cataract surgery & corneal implant bilaterally  . A flutter ablation      Dr Lovena Le  . Gravid 2 para 2    . Colonoscopy  6/07    2 polyps Dr. Kinnie Feil in [place]  . Bare metal stent  02/2007    Dr Lovena Le  . Colonoscopy with polypectomy       X 2; [facility]     Family History  Problem Relation Age of Onset  . Diabetes Mother   . Hypertension Mother   . Transient ischemic attack Mother   . Heart attack Father 39  . Arthritis Father   . Breast cancer Maternal Aunt   . Arthritis Maternal Aunt   . Arthritis Mother   . Hypertension Father      History   Social History  . Marital Status: Widowed    Spouse Name: N/A    Number of Children: N/A  . Years of Education: N/A   Occupational History  . Teacher    Social History Main Topics  . Smoking status: Former Smoker    Quit date: 12/27/1968  . Smokeless tobacco: Never Used     Comment: smoked [place], up to 1 pp week  . Alcohol Use: No  . Drug Use: No  . Sexual Activity: Not on file   Other Topics Concern  . Not on file   Social History Narrative   Teacher. Widowed. Rarely drinks cafeine.      BP 144/59  Pulse 47  Ht 5\' 4"  (1.626 m)  Wt 172 lb 6.4 oz (78.2 kg)  BMI 29.58 kg/m2  Physical Exam:  Well appearing elderly woman, NAD HEENT: Unremarkable Neck:  No JVD, no thyromegally Back:  No CVA tenderness Lungs:  Clear with minimal basilar rales HEART:  Regular rate rhythm, no murmurs, no rubs, no clicks Abd:  soft, positive bowel sounds, no organomegally, no rebound, no guarding Ext:  2 plus pulses, no edema, no cyanosis, no clubbing Skin:  No rashes no nodules Neuro:  CN II through XII intact, motor grossly intact  EKG - atrial flutter with a CVR, lateral ST depresion   Assess/Plan:

## 2014-05-07 NOTE — CV Procedure (Signed)
    Cardiac Catheterization Procedure Note  Name: STEPHEN TURNBAUGH MRN: [id number] DOB: 03-Aug-1931  Procedure: Right Heart Cath, Left Heart Cath, Selective Coronary Angiography, LV angiography  Indication: 80 yo BF with history of CAD s/p BMS of OM1 in 2008. She also has a history of chronic Afib, diastolic CHF, DM, HTN and hyperlipidemia presents with symptoms of progressive dyspnea on exertion.     Procedural Details: The right wrist was prepped, draped, and anesthetized with 1% lidocaine. Using the modified Seldinger technique a 5 French sheath was placed in the right radial artery and a 5 French sheath was placed in the right brachial vein. A Swan-Ganz catheter was used for the right heart catheterization. Standard protocol was followed for recording of right heart pressures and sampling of oxygen saturations. Fick cardiac output was calculated. Standard Judkins catheters were used for selective coronary angiography and left ventriculography. The RCA has an extreme anterior takeoff and was engaged with the Judkins left catheter.  There were no immediate procedural complications. The patient was transferred to the post catheterization recovery area for further monitoring.  Procedural Findings: Hemodynamics RA 7/10 mean 6 mm Hg RV 71/7 mm Hg PA 63/17 mean 33 mm Hg PCWP 20/27 mean 19 mm Hg LV 174/16 mm Hg AO 171/62 mean 103 mm Hg  Oxygen saturations: PA 69% AO 99%  Cardiac Output (Fick) 4.88 L/min  Cardiac Index (Fick) 2.7 L/min/ meter squared   Coronary angiography: Coronary dominance: right  Left mainstem: Normal.  Left anterior descending (LAD): Moderate calcification proximally. Mild irregularities less than 10%. The first diagonal has 40-50% ostial disease.  Left circumflex (LCx): The LCx gives rise to a large OM1 then terminates in the AV groove. There is 20% disease in the proximal LCx. The first OM stent is patent with diffuse 20% disease.  Right coronary artery (RCA):  The RCA arises anteriorly. There is an eccentric slit like stenosis in the proximal vessel to 60-70%. The mid vessel has segmental 70-80% disease.  Left ventriculography: Left ventricular systolic function is normal, LVEF is estimated at 55-65%, there is no significant mitral regurgitation   Final Conclusions:   1. Single vessel obstructive CAD with moderate disease in the proximal and mid RCA. The stent in the first OM is widely patent. 2. Normal LV function. 3. Severe pulmonary HTN.  Recommendations: Her symptoms are most consistent with her significant pulmonary HTN. I would recommend further evaluation of her pulmonary HTN and medical therapy. If she has significant chest pain could consider PCI of the RCA but she currently denies chest pain.   Ander Slade [facility] 05/07/2014, 10:34 AM

## 2014-05-07 NOTE — Interval H&P Note (Signed)
History and Physical Interval Note:  05/07/2014 9:36 AM  [name]  has presented today for surgery, with the diagnosis of cad  The various methods of treatment have been discussed with the patient and family. After consideration of risks, benefits and other options for treatment, the patient has consented to  Procedure(s): LEFT AND RIGHT HEART CATHETERIZATION WITH CORONARY ANGIOGRAM (N/A) as a surgical intervention .  The patient's history has been reviewed, patient examined, no change in status, stable for surgery.  I have reviewed the patient's chart and labs.  Questions were answered to the patient's satisfaction.    Cath Lab Visit (complete for each Cath Lab visit)  Clinical Evaluation Leading to the Procedure:   ACS: no  Non-ACS:    Anginal Classification: CCS III  Anti-ischemic medical therapy: Minimal Therapy (1 class of medications)  Non-Invasive Test Results: No non-invasive testing performed  Prior CABG: No previous CABG       Ander Slade [facility] 05/07/2014 9:37 AM

## 2014-05-08 ENCOUNTER — Telehealth: Payer: Self-pay | Admitting: Internal Medicine

## 2014-05-08 DIAGNOSIS — I272 Pulmonary hypertension, unspecified: Secondary | ICD-10-CM

## 2014-05-08 NOTE — Telephone Encounter (Signed)
error 

## 2014-05-08 NOTE — Telephone Encounter (Signed)
Patient aware someone will be calling her to schedule apt with Dr Aundra Dubin

## 2014-05-08 NOTE — Telephone Encounter (Signed)
New problem    Pt need to talk to nurse concerning here next step after having her cath

## 2014-05-10 ENCOUNTER — Telehealth: Payer: Self-pay | Admitting: Internal Medicine

## 2014-05-10 NOTE — Telephone Encounter (Signed)
Dr Aundra Dubin said to add her on one day next week   Wed of next week  Chantel is going to call the patient

## 2014-05-10 NOTE — Telephone Encounter (Signed)
New message     When are we going to get her an appt with Dr McLean?---pt had cath Tuesday and she said we were going to get her in with Dr Aundra Dubin. Please call

## 2014-05-10 NOTE — Telephone Encounter (Signed)
Spoke with patient and let her know that Dr Claris Gladden nurse is out but I would message him to see if we can expedite this

## 2014-05-10 NOTE — Telephone Encounter (Signed)
[facility] will call today to schedule

## 2014-05-15 ENCOUNTER — Ambulatory Visit (HOSPITAL_COMMUNITY)
Admission: RE | Admit: 2014-05-15 | Discharge: 2014-05-15 | Disposition: A | Discharge status: Home / Self Care | Payer: Medicare Other | Source: Ambulatory Visit | Attending: Cardiology | Admitting: Cardiology

## 2014-05-15 ENCOUNTER — Encounter (HOSPITAL_COMMUNITY): Payer: Self-pay

## 2014-05-15 VITALS — BP 118/42 | Wt 169.8 lb

## 2014-05-15 DIAGNOSIS — I4892 Unspecified atrial flutter: Secondary | ICD-10-CM

## 2014-05-15 DIAGNOSIS — I509 Heart failure, unspecified: Secondary | ICD-10-CM | POA: Diagnosis not present

## 2014-05-15 DIAGNOSIS — R0609 Other forms of dyspnea: Secondary | ICD-10-CM

## 2014-05-15 DIAGNOSIS — I272 Pulmonary hypertension, unspecified: Secondary | ICD-10-CM | POA: Insufficient documentation

## 2014-05-15 DIAGNOSIS — I2510 Atherosclerotic heart disease of native coronary artery without angina pectoris: Secondary | ICD-10-CM

## 2014-05-15 DIAGNOSIS — R0600 Dyspnea, unspecified: Secondary | ICD-10-CM

## 2014-05-15 DIAGNOSIS — I4891 Unspecified atrial fibrillation: Secondary | ICD-10-CM

## 2014-05-15 DIAGNOSIS — R0989 Other specified symptoms and signs involving the circulatory and respiratory systems: Secondary | ICD-10-CM

## 2014-05-15 DIAGNOSIS — R0602 Shortness of breath: Secondary | ICD-10-CM | POA: Insufficient documentation

## 2014-05-15 DIAGNOSIS — I2789 Other specified pulmonary heart diseases: Secondary | ICD-10-CM | POA: Diagnosis not present

## 2014-05-15 MED ORDER — CARVEDILOL 25 MG PO TABS: 25.0000 mg | ORAL_TABLET | Freq: Two times a day (BID) | ORAL | Status: DC

## 2014-05-15 NOTE — Progress Notes (Signed)
Patient ID: [name], female   DOB: September 27, 1931, 80 y.o.   MRN: [id number]  Referring Physician: Lovena [name] Primary Care: [name] Primary Cardiologist:[name]  HPI: [name] is a pleasant 80 yo woman with CAD, HTN, atrial fibrillation/ LA flutter and chronic, prior systolic heart failure, (which was likely rate related) and diastolic heart failure. She is referred by [name]. Lovena [name] for further evaluation of Pulmonary HTN.   Saw [name]. Lovena [name] a few weeks ago with worsening dyspnea and chest pressure. Underwent 2D echo in 3/15 which showed EF 60-65% mild LVH. RV was normal. No significant valvular abnormalities.   Underwent RlL cath 05/07/14 Which showed stable CAD and significant PH  RA 6 RV 71/7 6 PA 63/17 (33)  PCWP 19 mm Hg  LV 174/16 mm Hg  AO 171/62 mean 103 mm Hg  PVR = 2.86 Oxygen saturations:  PA 69%  AO 99%  Cardiac Output/Index (Fick) 4.88/2.7    Left mainstem: Normal.  Left anterior descending (LAD): Moderate calcification proximally. Mild irregularities less than 10%. The first diagonal has 40-50% ostial disease.  Left circumflex (LCx): The LCx gives rise to a large OM1 then terminates in the AV groove. There is 20% disease in the proximal LCx. The first OM stent is patent with diffuse 20% disease.  Right coronary artery (RCA): The RCA arises anteriorly. There is an eccentric slit like stenosis in the proximal vessel to 60-70%. The mid vessel has segmental 70-80% disease.  Left ventriculography: Left ventricular systolic function is normal, LVEF is estimated at 55-65%, there is no significant mitral regurgitation   Since that time scheduled for PFTs but has not got them yet. Also increased lasix from 40mg  daily to 80mg  daily. Weight down 3 pounds. Breathing some better but still SOB with mild activity[place] [employer]. Able to do water aerobics but still out of breath with ADLs. This is much different from a few months ago. In August had episode of  palpitations with syncope. But no syncope since. Still with edema. + orthopnea or PND. Non-smoker. Takes Pradaxa for AF - has been on for 3-4 years. Denies any h/o lung disease. + dry cough. Last CXR 01/2010 - mild CHF. No parenchymal disease. Drinks lots of H2O    Review of Systems: [y] = yes, [ ]  = no   General: Weight gain Blue.Reese ]; Weight loss [ ] ; Anorexia [ ] ; Fatigue Blue.Reese ]; Fever [ ] ; Chills [ ] ; Weakness [ ]   Cardiac: Chest pain/pressure [ ] ; Resting SOB Blue.Reese ]; Exertional SOB Blue.Reese ]; Orthopnea Blue.Reese ]; Pedal Edema [ y]; Palpitations [ ] ; Syncope Blue.Reese ]; Presyncope [ ] ; Paroxysmal nocturnal dyspnea[y ]  Pulmonary: Cough [ ] ; Wheezing[ ] ; Hemoptysis[ ] ; Sputum [ ] ; Snoring [ ]   GI: Vomiting[ ] ; Dysphagia[ ] ; Melena[ ] ; Hematochezia [ ] ; Heartburn[ ] ; Abdominal pain [ ] ; Constipation [ ] ; Diarrhea [ ] ; BRBPR [ ]   GU: Hematuria[ ] ; Dysuria [ ] ; Nocturia[ ]   Vascular: Pain in legs with walking [ ] ; Pain in feet with lying flat [ ] ; Non-healing sores [ ] ; Stroke [ ] ; TIA [ ] ; Slurred speech [ ] ;  Neuro: Headaches[ ] ; Vertigo[ ] ; Seizures[ ] ; Paresthesias[ ] ;Blurred vision [ ] ; Diplopia [ ] ; Vision changes [ ]   Ortho/Skin: Arthritis Blue.Reese ]; Joint pain [ ] ; Muscle pain [ ] ; Joint swelling [ ] ; Back Pain [ ] ; Rash [ ]   Psych: Depression[ ] ; Anxiety[ ]   Heme: Bleeding problems [ ] ; Clotting disorders [ ] ;  Anemia [ ]   Endocrine: Diabetes Blue.Reese ]; Thyroid dysfunction[y ]   Past Medical History  Diagnosis Date  . Hypopotassemia     PMH of  . Anemia     iron defficiency  . Type II or unspecified type diabetes mellitus without mention of complication, uncontrolled     [name]  . Muscle pain   . Degenerative joint disease   . Atrial fibrillation or flutter     s/p RFCA 7/08;   s/p DCCV in past;   previously on amiodarone;  amio stopped due to lung toxicity  . CAD (coronary artery disease)     s/p NSTEMI tx with BMS to OM1 3/08;  cath 3/08: pOM 99% tx with PCI, pLAD 20%, ? mod stenosis at the AM  . HLD  (hyperlipidemia)   . HTN (hypertension)     essential nos  . Dystrophy, corneal stromal   . Chronic diastolic heart failure     echo 11/11:  EF 55-60%, severe LVH, mod LAE, mild MR, mildly increased PASP  . Allergy     seasonal  . Cataract   . CHF (congestive heart failure)   . Myocardial infarction 2008  . Osteoporosis   . Thyroid disease     hypothyroidism    Current Outpatient Prescriptions  Medication Sig Dispense Refill  . Calcium-Magnesium-Zinc (CAL-MAG-ZINC PO) Take 1 tablet by mouth daily.       . carvedilol (COREG) 25 MG tablet Take 2 tablets (50 mg total) by mouth 2 (two) times daily with a meal.  360 tablet  1  . Cholecalciferol (VITAMIN D3) 1000 UNITS CAPS Take 1 capsule by mouth daily.       . digoxin (LANOXIN) 0.125 MG tablet Take 0.0625 mg by mouth daily.      . folic acid (FOLVITE) 481 MCG tablet Take 800 mcg by mouth daily.      . furosemide (LASIX) 40 MG tablet Take 80 mg by mouth daily.      . insulin NPH Human (HUMULIN N,NOVOLIN N) 100 UNIT/ML injection Inject 8-22 Units into the skin 2 (two) times daily before a meal. 22 units in the morning (8 if you are going to be active (MWF)), and 14 units in the evening.      . insulin regular (NOVOLIN R,HUMULIN R) 100 units/mL injection Inject 8-22 Units into the skin 2 (two) times daily before a meal. 22 units in the morning (8 if you are going to be active (MWF)), and 14 units in the evening.      . methimazole (TAPAZOLE) 5 MG tablet Take 5 mg by mouth every Monday, Wednesday, and Friday.      . nitroGLYCERIN (NITROSTAT) 0.4 MG SL tablet Place 0.4 mg under the tongue every 5 (five) minutes as needed for chest pain.      . Omega-3 Fatty Acids (FISH OIL) 1200 MG CAPS Take 1 capsule by mouth daily.       . potassium chloride SA (K-DUR,KLOR-CON) 20 MEQ tablet Take 1 tablet (20 mEq total) by mouth 2 (two) times daily.  180 tablet  3  . rosuvastatin (CRESTOR) 20 MG tablet Take 1 tablet (20 mg total) by mouth daily.  90 tablet  1   . tiotropium (SPIRIVA) 18 MCG inhalation capsule Place 1 capsule (18 mcg total) into inhaler and inhale daily.  90 capsule  1  . vitamin B-12 (CYANOCOBALAMIN) 1000 MCG tablet Take 1,000 mcg by mouth daily.         No  current facility-administered medications for this encounter.    Allergies  Allergen Reactions  . Amlodipine Besylate     REACTION: tingling in lips & gum edema  . Lobster [Shellfish Allergy]     angioedema  . Penicillins     REACTION: rash  . Valsartan     REACTION: angioedema  . Codeine     Mental status changes  . Tramadol Hcl     REACTION: nausea    History   Social History  . Marital Status: Widowed    Spouse Name: N/A    Number of Children: N/A  . Years of Education: N/A   Occupational History  . Teacher    Social History Main Topics  . Smoking status: Former Smoker    Quit date: 12/27/1968  . Smokeless tobacco: Never Used     Comment: smoked [place], up to 1 pp week  . Alcohol Use: No  . Drug Use: No  . Sexual Activity: Not on file   Other Topics Concern  . Not on file   Social History Narrative   Teacher. Widowed. Rarely drinks cafeine.     Family History  Problem Relation Age of Onset  . Diabetes Mother   . Hypertension Mother   . Transient ischemic attack Mother   . Heart attack Father 27  . Arthritis Father   . Breast cancer Maternal Aunt   . Arthritis Maternal Aunt   . Arthritis Mother   . Hypertension Father     PHYSICAL EXAM: Filed Vitals:   05/15/14 1045  BP: 118/42  Weight: 169 lb 12.8 oz (77.021 kg)  HR 54  General:  Looks younger than stated age. Well appearing. No respiratory difficulty HEENT: normal Neck: supple. JVP 6-7. Carotids 2+ bilat; no bruits. No lymphadenopathy or thryomegaly appreciated. Cor: PMI nondisplaced. Bradycardic Irregular rate & rhythm. No rubs, gallops or murmurs. Lungs: clear with diminished BS throughout. No wheeze Abdomen: soft, nontender, nondistended. No hepatosplenomegaly. No  bruits or masses. Good bowel sounds. Extremities: no cyanosis, clubbing, rash, edema Neuro: alert & oriented x 3, cranial nerves grossly intact. moves all 4 extremities w/o difficulty. Affect pleasant.  ECG: AFib/flutter 47. Lateral ST-T wave abnormalities   Hall walk: HR 54->80 Sat 95% ->92%   ASSESSMENT & PLAN: 1) Mild pulmonary HTN 2) Dyspnea  3) Chronic diastolic HF, NYHA III 4) Chronic atrial fib/flutter 5) Bradycardia  I suspect her dyspnea is multifactorial. I reviewed her RHC with her in detail. She has mild PAH with normal PVR which argues against primary PAH. I suspect her PH is mostly due to chronic diastolic HF but her lung exam isn't normal either. Finally, she has significant bradycardia and is on very high-dose AVN blockers. Plan will be:  1) Continue increased dose lasix 2) Cut H2O intake 3) Daily weights 4) Check CXR, PFTS and VQ scan 5) Stop Digoxin and cut carvedilol in half (down to 25 bid) 6) Consider overnight oximetry at next visit 7) Return in 2 weeks for f/u  Shaune Pascal Bensimhon,MD 12:11 PM

## 2014-05-15 NOTE — Patient Instructions (Addendum)
Stop Digoxin  Decrease Carvedilol to 25 mg Twice daily   A chest x-ray takes a picture of the organs and structures inside the chest, including the heart, lungs, and blood vessels. This test can show several things, including, whether the heart is enlarges; whether fluid is building up in the lungs; and whether pacemaker / defibrillator leads are still in place.  Your physician has recommended that you have a pulmonary function test. Pulmonary Function Tests are a group of tests that measure how well air moves in and out of your lungs.  VQ Scan  Decrease the amount of fluid you drink daily to less than 2 Liters, this includes all fluids (water, juice, coffee, tea, ice)  Your physician recommends that you schedule a follow-up appointment in: 2 weeks

## 2014-05-21 ENCOUNTER — Ambulatory Visit (HOSPITAL_COMMUNITY)
Admission: RE | Admit: 2014-05-21 | Discharge: 2014-05-21 | Disposition: A | Discharge status: Home / Self Care | Payer: Medicare Other | Source: Ambulatory Visit | Attending: Internal Medicine | Admitting: Internal Medicine

## 2014-05-21 ENCOUNTER — Ambulatory Visit: Payer: Medicare Other | Admitting: Internal Medicine

## 2014-05-21 DIAGNOSIS — I2789 Other specified pulmonary heart diseases: Secondary | ICD-10-CM | POA: Insufficient documentation

## 2014-05-21 DIAGNOSIS — I272 Pulmonary hypertension, unspecified: Secondary | ICD-10-CM

## 2014-05-21 DIAGNOSIS — R0602 Shortness of breath: Secondary | ICD-10-CM | POA: Insufficient documentation

## 2014-05-21 DIAGNOSIS — R0600 Dyspnea, unspecified: Secondary | ICD-10-CM

## 2014-05-21 DIAGNOSIS — I509 Heart failure, unspecified: Secondary | ICD-10-CM

## 2014-05-21 DIAGNOSIS — I4891 Unspecified atrial fibrillation: Secondary | ICD-10-CM

## 2014-05-21 MED ORDER — TECHNETIUM TC 99M DIETHYLENETRIAME-PENTAACETIC ACID: 40.0000 | Freq: Once | INTRAVENOUS | Status: AC | PRN

## 2014-05-21 MED ORDER — TECHNETIUM TO 99M ALBUMIN AGGREGATED
6.0000 | Freq: Once | INTRAVENOUS | Status: AC | PRN
  Administered 2014-05-21: 6 via INTRAVENOUS

## 2014-05-21 MED ORDER — ALBUTEROL SULFATE (2.5 MG/3ML) 0.083% IN NEBU
2.5000 mg | INHALATION_SOLUTION | Freq: Once | RESPIRATORY_TRACT | Status: AC
  Administered 2014-05-21: 2.5 mg via RESPIRATORY_TRACT

## 2014-05-29 ENCOUNTER — Ambulatory Visit (HOSPITAL_COMMUNITY)
Admission: RE | Admit: 2014-05-29 | Discharge: 2014-05-29 | Disposition: A | Discharge status: Home / Self Care | Payer: Medicare Other | Source: Ambulatory Visit | Attending: Internal Medicine | Admitting: Internal Medicine

## 2014-05-29 VITALS — BP 148/64 | HR 82 | Wt 171.5 lb

## 2014-05-29 DIAGNOSIS — R0989 Other specified symptoms and signs involving the circulatory and respiratory systems: Secondary | ICD-10-CM

## 2014-05-29 DIAGNOSIS — I4892 Unspecified atrial flutter: Secondary | ICD-10-CM | POA: Insufficient documentation

## 2014-05-29 DIAGNOSIS — I272 Pulmonary hypertension, unspecified: Secondary | ICD-10-CM

## 2014-05-29 DIAGNOSIS — E039 Hypothyroidism, unspecified: Secondary | ICD-10-CM | POA: Insufficient documentation

## 2014-05-29 DIAGNOSIS — I2789 Other specified pulmonary heart diseases: Secondary | ICD-10-CM | POA: Insufficient documentation

## 2014-05-29 DIAGNOSIS — Z87891 Personal history of nicotine dependence: Secondary | ICD-10-CM | POA: Insufficient documentation

## 2014-05-29 DIAGNOSIS — I498 Other specified cardiac arrhythmias: Secondary | ICD-10-CM | POA: Insufficient documentation

## 2014-05-29 DIAGNOSIS — R0600 Dyspnea, unspecified: Secondary | ICD-10-CM

## 2014-05-29 DIAGNOSIS — I4891 Unspecified atrial fibrillation: Secondary | ICD-10-CM | POA: Insufficient documentation

## 2014-05-29 DIAGNOSIS — E785 Hyperlipidemia, unspecified: Secondary | ICD-10-CM | POA: Insufficient documentation

## 2014-05-29 DIAGNOSIS — R0609 Other forms of dyspnea: Secondary | ICD-10-CM

## 2014-05-29 DIAGNOSIS — E119 Type 2 diabetes mellitus without complications: Secondary | ICD-10-CM | POA: Insufficient documentation

## 2014-05-29 DIAGNOSIS — Z794 Long term (current) use of insulin: Secondary | ICD-10-CM | POA: Insufficient documentation

## 2014-05-29 DIAGNOSIS — I5032 Chronic diastolic (congestive) heart failure: Secondary | ICD-10-CM | POA: Insufficient documentation

## 2014-05-29 DIAGNOSIS — I509 Heart failure, unspecified: Secondary | ICD-10-CM | POA: Insufficient documentation

## 2014-05-29 DIAGNOSIS — I252 Old myocardial infarction: Secondary | ICD-10-CM | POA: Insufficient documentation

## 2014-05-29 DIAGNOSIS — Z9861 Coronary angioplasty status: Secondary | ICD-10-CM | POA: Insufficient documentation

## 2014-05-29 MED ORDER — APIXABAN 5 MG PO TABS: 5.0000 mg | ORAL_TABLET | Freq: Two times a day (BID) | ORAL | Status: DC

## 2014-05-29 MED ORDER — FUROSEMIDE 40 MG PO TABS: ORAL_TABLET | ORAL | Status: DC

## 2014-05-29 NOTE — Progress Notes (Signed)
Patient ID: AVANA KREISER, female   DOB: [birth year], 80 y.o.   MRN: [id number]  Referring Physician: Lovena Le Primary Care: Linna Darner Primary Cardiologist:Taylor  HPI: Mrs. Zammit is a pleasant 80 yo woman with CAD, HTN, atrial fibrillation/ LA flutter and chronic, prior systolic heart failure, (which was likely rate related) and diastolic heart failure. She is referred by Dr. Lovena Le for further evaluation of Pulmonary HTN.   Saw Dr. Lovena Le a few weeks ago with worsening dyspnea and chest pressure. Underwent 2D echo in 3/15 which showed EF 60-65% mild LVH. RV was normal. No significant valvular abnormalities.   Underwent R/L cath 05/07/14 Which showed stable CAD and significant PH  RA 6 RV 71/7 6 PA 63/17 (33)  PCWP 19 mm Hg  LV 174/16 mm Hg  AO 171/62 mean 103 mm Hg  PVR = 2.86 Oxygen saturations:  PA 69%  AO 99%  Cardiac Output/Index (Fick) 4.88/2.7    Left mainstem: Normal.  Left anterior descending (LAD): Moderate calcification proximally. Mild irregularities less than 10%. The first diagonal has 40-50% ostial disease.  Left circumflex (LCx): The LCx gives rise to a large OM1 then terminates in the AV groove. There is 20% disease in the proximal LCx. The first OM stent is patent with diffuse 20% disease.  Right coronary artery (RCA): The RCA arises anteriorly. There is an eccentric slit like stenosis in the proximal vessel to 60-70%. The mid vessel has segmental 70-80% disease.  Left ventriculography: Left ventricular systolic function is normal, LVEF is estimated at 55-65%, there is no significant mitral regurgitation   We saw her for an initial visit about 1 month ago for pHTN and DOE. At that time she was quite dyspneic with activity. HR was in 40-50 range on carvedilol 50 bid and digoxin. We stopped her digoxin and cut carvedilol back to 25 bid. PFTs, VQ and CXR were ordered. I also asked her to decrease her H2O intake. She says she has done this but daughter says she is still  drinking in her car. Says her breathing is much better and feels great. Now taking lasix 40 one day and 20 the next. On days she takes 20 her weight goes. Weighs every day 168-170. Not taking extra lasix.   Results:  05/21/14: VQ normal 5/15: Emphysema. Otherwise normal.  5/15: PFTs FEV1  1.4 (95%) FVC    1.89 (110%) FEV/FVC 88% FEF 25-75%  66% DLCO 39% Mild obs disease/severe diffusion defect   Review of Systems: [y] = yes, [ ]  = no   General: Weight gain Blue.Reese ]; Weight loss [ ] ; Anorexia [ ] ; Fatigue Blue.Reese ]; Fever [ ] ; Chills [ ] ; Weakness [ ]   Cardiac: Chest pain/pressure [ ] ; Resting SOB Blue.Reese ]; Exertional SOB Blue.Reese ]; Orthopnea Blue.Reese ]; Pedal Edema [ y]; Palpitations [ ] ; Syncope Blue.Reese ]; Presyncope [ ] ; Paroxysmal nocturnal dyspnea[y ]  Pulmonary: Cough [ ] ; Wheezing[ ] ; Hemoptysis[ ] ; Sputum [ ] ; Snoring [ ]   GI: Vomiting[ ] ; Dysphagia[ ] ; Melena[ ] ; Hematochezia [ ] ; Heartburn[ ] ; Abdominal pain [ ] ; Constipation [ ] ; Diarrhea [ ] ; BRBPR [ ]   GU: Hematuria[ ] ; Dysuria [ ] ; Nocturia[ ]   Vascular: Pain in legs with walking [ ] ; Pain in feet with lying flat [ ] ; Non-healing sores [ ] ; Stroke [ ] ; TIA [ ] ; Slurred speech [ ] ;  Neuro: Headaches[ ] ; Vertigo[ ] ; Seizures[ ] ; Paresthesias[ ] ;Blurred vision [ ] ; Diplopia [ ] ; Vision changes [ ]   Ortho/Skin: Arthritis Blue.Reese ]; Joint  pain [ ] ; Muscle pain [ ] ; Joint swelling [ ] ; Back Pain [ ] ; Rash [ ]   Psych: Depression[ ] ; Anxiety[ ]   Heme: Bleeding problems [ ] ; Clotting disorders [ ] ; Anemia [ ]   Endocrine: Diabetes Blue.Reese ]; Thyroid dysfunction[y ]   Past Medical History  Diagnosis Date  . Hypopotassemia     PMH of  . Anemia     iron defficiency  . Type II or unspecified type diabetes mellitus without mention of complication, uncontrolled     Dr Loanne Drilling  . Muscle pain   . Degenerative joint disease   . Atrial fibrillation or flutter     s/p RFCA 7/08;   s/p DCCV in past;   previously on amiodarone;  amio stopped due to lung toxicity  . CAD  (coronary artery disease)     s/p NSTEMI tx with BMS to OM1 3/08;  cath 3/08: pOM 99% tx with PCI, pLAD 20%, ? mod stenosis at the AM  . HLD (hyperlipidemia)   . HTN (hypertension)     essential nos  . Dystrophy, corneal stromal   . Chronic diastolic heart failure     echo 11/11:  EF 55-60%, severe LVH, mod LAE, mild MR, mildly increased PASP  . Allergy     seasonal  . Cataract   . CHF (congestive heart failure)   . Myocardial infarction 2008  . Osteoporosis   . Thyroid disease     hypothyroidism    Current Outpatient Prescriptions  Medication Sig Dispense Refill  . Calcium-Magnesium-Zinc (CAL-MAG-ZINC PO) Take 1 tablet by mouth daily.       . carvedilol (COREG) 25 MG tablet Take 1 tablet (25 mg total) by mouth 2 (two) times daily with a meal.  360 tablet  1  . Cholecalciferol (VITAMIN D3) 1000 UNITS CAPS Take 1 capsule by mouth daily.       . folic acid (FOLVITE) 458 MCG tablet Take 800 mcg by mouth daily.      . furosemide (LASIX) 40 MG tablet Taking 40 mg every other day alternating with 20 mg every other day      . insulin NPH Human (HUMULIN N,NOVOLIN N) 100 UNIT/ML injection Inject 8-22 Units into the skin 2 (two) times daily before a meal. 22 units in the morning (8 if you are going to be active (MWF)), and 14 units in the evening.      . insulin regular (NOVOLIN R,HUMULIN R) 100 units/mL injection Inject 8-22 Units into the skin 2 (two) times daily before a meal. 22 units in the morning (8 if you are going to be active (MWF)), and 14 units in the evening.      . methimazole (TAPAZOLE) 5 MG tablet Take 5 mg by mouth every Monday, Wednesday, and Friday.      . nitroGLYCERIN (NITROSTAT) 0.4 MG SL tablet Place 0.4 mg under the tongue every 5 (five) minutes as needed for chest pain.      . Omega-3 Fatty Acids (FISH OIL) 1200 MG CAPS Take 1 capsule by mouth daily.       . potassium chloride SA (K-DUR,KLOR-CON) 20 MEQ tablet Take 2 tabs every other day alternating with 1 tab every other  day      . rosuvastatin (CRESTOR) 20 MG tablet Take 1 tablet (20 mg total) by mouth daily.  90 tablet  1  . tiotropium (SPIRIVA) 18 MCG inhalation capsule Place 1 capsule (18 mcg total) into inhaler and inhale daily.  90 capsule  1  . vitamin B-12 (CYANOCOBALAMIN) 1000 MCG tablet Take 1,000 mcg by mouth daily.         No current facility-administered medications for this encounter.    Allergies  Allergen Reactions  . Amlodipine Besylate     REACTION: tingling in lips & gum edema  . Lobster [Shellfish Allergy]     angioedema  . Penicillins     REACTION: rash  . Valsartan     REACTION: angioedema  . Codeine     Mental status changes  . Tramadol Hcl     REACTION: nausea    History   Social History  . Marital Status: Widowed    Spouse Name: N/A    Number of Children: N/A  . Years of Education: N/A   Occupational History  . Teacher    Social History Main Topics  . Smoking status: Former Smoker    Quit date: 12/27/1968  . Smokeless tobacco: Never Used     Comment: smoked [place], up to 1 pp week  . Alcohol Use: No  . Drug Use: No  . Sexual Activity: Not on file   Other Topics Concern  . Not on file   Social History Narrative   Teacher. Widowed. Rarely drinks cafeine.     Family History  Problem Relation Age of Onset  . Diabetes Mother   . Hypertension Mother   . Transient ischemic attack Mother   . Heart attack Father 63  . Arthritis Father   . Breast cancer Maternal Aunt   . Arthritis Maternal Aunt   . Arthritis Mother   . Hypertension Father     PHYSICAL EXAM: Filed Vitals:   05/29/14 1018  BP: 148/64  Pulse: 82  Weight: 171 lb 8 oz (77.792 kg)  SpO2: 92%    General:  Looks younger than stated age. Well appearing. No respiratory difficulty HEENT: normal Neck: supple. JVP 6. Carotids 2+ bilat; no bruits. No lymphadenopathy or thryomegaly appreciated. Cor: PMI nondisplaced.  Irregular rate & rhythm. No rubs, gallops or murmurs. Lungs: clear  with diminished BS throughout. No wheeze Abdomen: soft, nontender, nondistended. No hepatosplenomegaly. No bruits or masses. Good bowel sounds. Extremities: no cyanosis, clubbing, rash, edema Neuro: alert & oriented x 3, cranial nerves grossly intact. moves all 4 extremities w/o difficulty. Affect pleasant.   Hall walk: HR 54->80 Sat 95% ->92%   ASSESSMENT & PLAN: 1) Mild pulmonary HTN 2) Dyspnea  3) Chronic diastolic HF, NYHA III 4) Chronic atrial fib/flutter 5) Bradycardia  Much improved with reduction of AVN blockers. AF rate still well controlled. PFTs and CXR suggestive of emphysema but DLCO reduced out of proportion to spirometry. Will check overnight oximetry to rule out hypoxia. May need hi-res CT to exclude ILD.  Reinforced need for daily weights and reviewed use of sliding scale diuretics. Take extra lasix weight > 170  Switch Prdaxa to eliquis 5 bid.   If BP stays up add norvasc 5  Shaune Pascal Bensimhon,MD 10:51 AM

## 2014-05-29 NOTE — Patient Instructions (Signed)
Take extra 20 mg of Furosemide (lasix) if weight is 170 lb or greater  Stop Pradaxa   Start Eliquis 5 mg Twice daily  Overnight Oximetry test, we will contact [facility] with the order and they will contact you to schedule  Your physician recommends that you schedule a follow-up appointment in: 2 months

## 2014-06-05 ENCOUNTER — Other Ambulatory Visit: Payer: Self-pay | Admitting: Internal Medicine

## 2014-06-11 ENCOUNTER — Other Ambulatory Visit (HOSPITAL_COMMUNITY): Payer: Self-pay

## 2014-06-11 ENCOUNTER — Telehealth (HOSPITAL_COMMUNITY): Payer: Self-pay | Admitting: Vascular Surgery

## 2014-06-11 ENCOUNTER — Encounter (HOSPITAL_COMMUNITY): Payer: Self-pay

## 2014-06-11 DIAGNOSIS — G4733 Obstructive sleep apnea (adult) (pediatric): Secondary | ICD-10-CM

## 2014-06-11 NOTE — Telephone Encounter (Signed)
Pt  Wants results from her breathing  Monitor and she is also having SOB @ night . She would like someone  to please call her back

## 2014-06-11 NOTE — Telephone Encounter (Signed)
Spoke with pt regarding results Pt is looking for overnight pulse oximetry results Advised pt results are not available to me at this time Will have to follow up with (06/14/14(Heather, RN and Dr.Bensimhon for results  Also addressed increased SOB Pt states her SOB her gotten better however it is still present Increased at night needing to sleep on additional pillows  Denies: CP or increased edema Weight stable 168-170 Advised to return call in the AM as she may need additional diuretics even though weight is stable Will review with NP in the AM

## 2014-06-13 LAB — PULMONARY FUNCTION TEST
DL/VA % pred: 54 %
DL/VA: 2.56 ml/min/mmHg/L
DLCO cor % pred: 39 %
DLCO cor: 8.97 ml/min/mmHg
DLCO unc % pred: 39 %
DLCO unc: 8.97 ml/min/mmHg
FEF 25-75 POST: 0.67 L/s
FEF 25-75 Pre: 0.81 L/sec
FEF2575-%CHANGE-POST: -16 %
FEF2575-%PRED-PRE: 66 %
FEF2575-%Pred-Post: 55 %
FEV1-%Change-Post: -1 %
FEV1-%PRED-PRE: 95 %
FEV1-%Pred-Post: 94 %
FEV1-Post: 1.37 L
FEV1-Pre: 1.4 L
FEV1FVC-%Change-Post: -1 %
FEV1FVC-%Pred-Pre: 88 %
FEV6-%CHANGE-POST: 0 %
FEV6-%Pred-Post: 115 %
FEV6-%Pred-Pre: 115 %
FEV6-Post: 2.07 L
FEV6-Pre: 2.08 L
FEV6FVC-%Change-Post: 0 %
FEV6FVC-%Pred-Post: 104 %
FEV6FVC-%Pred-Pre: 105 %
FVC-%CHANGE-POST: 0 %
FVC-%PRED-POST: 110 %
FVC-%Pred-Pre: 110 %
FVC-Post: 2.09 L
FVC-Pre: 2.1 L
POST FEV1/FVC RATIO: 66 %
PRE FEV6/FVC RATIO: 100 %
Post FEV6/FVC ratio: 99 %
Pre FEV1/FVC ratio: 67 %
RV % PRED: 117 %
RV: 2.77 L
TLC % pred: 95 %
TLC: 4.69 L

## 2014-06-21 NOTE — Telephone Encounter (Signed)
Spoke w/pt she did receive the letter that was mailed to her last week regarding appt w/pulm, she is aware, she questions weight playing a role as she has gained 20 lbs since Nov per her records, she is requesting appt with nutritionist, will refer to nutritionist at [place] at [facility]

## 2014-06-24 ENCOUNTER — Telehealth (HOSPITAL_COMMUNITY): Payer: Self-pay | Admitting: Vascular Surgery

## 2014-06-24 NOTE — Telephone Encounter (Signed)
Mary from [place] called they received a referral for the pt to see a nutritionist. They no longer have a nutritionist at that office.

## 2014-06-25 ENCOUNTER — Telehealth: Payer: Self-pay | Admitting: *Deleted

## 2014-06-25 NOTE — Telephone Encounter (Signed)
Patient requests eliquis samples. I will place at the front desk for pick up.

## 2014-06-27 NOTE — Telephone Encounter (Signed)
Pt aware, she will contact her pcp for guidance

## 2014-07-02 ENCOUNTER — Other Ambulatory Visit: Payer: Medicare Other

## 2014-07-04 ENCOUNTER — Institutional Professional Consult (permissible substitution): Payer: Medicare Other | Admitting: Pulmonary Disease

## 2014-07-09 ENCOUNTER — Ambulatory Visit (INDEPENDENT_AMBULATORY_CARE_PROVIDER_SITE_OTHER): Payer: Medicare Other | Admitting: Pulmonary Disease

## 2014-07-09 ENCOUNTER — Encounter: Payer: Self-pay | Admitting: Pulmonary Disease

## 2014-07-09 VITALS — BP 124/64 | HR 73 | Temp 97.6°F | Ht 63.0 in | Wt 175.0 lb

## 2014-07-09 DIAGNOSIS — G4734 Idiopathic sleep related nonobstructive alveolar hypoventilation: Secondary | ICD-10-CM

## 2014-07-09 NOTE — Assessment & Plan Note (Signed)
The patient has documented nocturnal hypoxemia by a recent overnight oximetry, and it is unclear if it is related to her diastolic heart failure with secondary pulmonary hypertension versus a component from sleep apnea. The patient does have some history that is suspicious for sleep disordered breathing, and I think she would benefit from a sleep study for documentation.  The patient is agreeable to this approach.

## 2014-07-09 NOTE — Patient Instructions (Signed)
Will schedule for a sleep study to evaluate your low oxygen level at night Will arrange for followup once the results are available.

## 2014-07-09 NOTE — Progress Notes (Signed)
Subjective:    Patient ID: [name], female    DOB: Aug 05, 1931, 80 y.o.   MRN: [id number]  HPI The patient is an 80 year old female who I have been asked to see for possible obstructive sleep apnea. She has had overnight oximetry which showed oxygen desaturation as low as 83%, and spent 97 minutes less than 88% during the night. Patient has known diastolic heart failure with an elevated pulmonary capillary wedge pressure, as well as pulmonary hypertension. She has been noted to have snoring by family member, but does not have a bed partner who can comment on possible witnessed apneas. She denies any choking arousals. She does not awaken during the night, but feels unrested about 50% of the mornings. She does notes sleep pressure during the day at times, and the daughter states that she can fall asleep often with inactivity. She also does this in the evening watching television or movies, but has no issues driving shorter distances. The patient states that her weight is up 22 pounds since November last year, and her Epworth score today is 5.   Sleep Questionnaire What time do you typically go to bed?( Between what hours) 11-12 11-12 at 1441 on 07/09/14 by Lilli Few, CMA How long does it take you to fall asleep? 15 minutes 15 minutes at 1441 on 07/09/14 by Lilli Few, CMA How many times during the night do you wake up? 0 0 at 1441 on 07/09/14 by Lilli Few, CMA What time do you get out of bed to start your day? 0800 0800 at 1441 on 07/09/14 by Lilli Few, CMA Do you drive or operate heavy machinery in your occupation? No No at 1441 on 07/09/14 by Lilli Few, CMA How much has your weight changed (up or down) over the past two years? (In pounds) 22 lb (9.979 kg) 22 lb (9.979 kg) at 1441 on 07/09/14 by Lilli Few, CMA Have you ever had a sleep study before? No No at 1441 on 07/09/14 by Lilli Few, CMA Do you currently  use CPAP? No No at 1441 on 07/09/14 by Lilli Few, CMA Do you wear oxygen at any time? No    Review of Systems  Constitutional: Positive for unexpected weight change. Negative for fever.  HENT: Negative for congestion, dental problem, ear pain, nosebleeds, postnasal drip, rhinorrhea, sinus pressure, sneezing, sore throat and trouble swallowing.   Eyes: Negative for redness and itching.  Respiratory: Positive for shortness of breath. Negative for cough, chest tightness and wheezing.   Cardiovascular: Positive for palpitations and leg swelling.  Gastrointestinal: Negative for nausea and vomiting.  Genitourinary: Negative for dysuria.  Musculoskeletal: Negative for joint swelling.  Skin: Negative for rash.  Neurological: Negative for headaches.  Hematological: Does not bruise/bleed easily.  Psychiatric/Behavioral: Negative for dysphoric mood. The patient is not nervous/anxious.        Objective:   Physical Exam Constitutional:  Well developed, no acute distress  HENT:  Nares patent without discharge  Oropharynx without exudate, palate and uvula are normal  Eyes:  Perrla, eomi, no scleral icterus  Neck:  No JVD, no TMG  Cardiovascular:  Normal rate, slightly  irregular rhythm, no rubs or gallops.  No murmurs        Intact distal pulses  Pulmonary :  Normal breath sounds, no stridor or respiratory distress   No rales, rhonchi, or wheezing  Abdominal:  Soft, nondistended, bowel sounds present.  No tenderness noted.  Musculoskeletal:  No lower extremity edema noted.  Lymph Nodes:  No cervical lymphadenopathy noted  Skin:  No cyanosis noted  Neurologic:  Alert, appropriate, moves all 4 extremities without obvious deficit.         Assessment & Plan:

## 2014-07-11 NOTE — Telephone Encounter (Signed)
Close encounter 

## 2014-07-15 ENCOUNTER — Telehealth: Payer: Self-pay | Admitting: Internal Medicine

## 2014-07-15 NOTE — Telephone Encounter (Addendum)
Form received and placed in Dr [employer] in box on his desk.

## 2014-07-15 NOTE — Telephone Encounter (Signed)
Patient wanted to make you aware that patient assistance is sending request for crestor.  Patient only has 4 days left of med.

## 2014-07-23 ENCOUNTER — Telehealth: Payer: Self-pay

## 2014-07-23 NOTE — Telephone Encounter (Signed)
Placed samples of eliquis up front and called the son to let him know

## 2014-07-30 ENCOUNTER — Ambulatory Visit (HOSPITAL_COMMUNITY)
Admission: RE | Admit: 2014-07-30 | Discharge: 2014-07-30 | Disposition: A | Discharge status: Home / Self Care | Payer: Medicare Other | Source: Ambulatory Visit | Attending: Internal Medicine | Admitting: Internal Medicine

## 2014-07-30 ENCOUNTER — Encounter (HOSPITAL_COMMUNITY): Payer: Self-pay

## 2014-07-30 VITALS — BP 136/54 | HR 78 | Wt 175.8 lb

## 2014-07-30 DIAGNOSIS — I4819 Other persistent atrial fibrillation: Secondary | ICD-10-CM

## 2014-07-30 DIAGNOSIS — I10 Essential (primary) hypertension: Secondary | ICD-10-CM | POA: Diagnosis not present

## 2014-07-30 DIAGNOSIS — Z794 Long term (current) use of insulin: Secondary | ICD-10-CM | POA: Insufficient documentation

## 2014-07-30 DIAGNOSIS — E119 Type 2 diabetes mellitus without complications: Secondary | ICD-10-CM | POA: Insufficient documentation

## 2014-07-30 DIAGNOSIS — I498 Other specified cardiac arrhythmias: Secondary | ICD-10-CM | POA: Insufficient documentation

## 2014-07-30 DIAGNOSIS — I2789 Other specified pulmonary heart diseases: Secondary | ICD-10-CM | POA: Diagnosis present

## 2014-07-30 DIAGNOSIS — Z91013 Allergy to seafood: Secondary | ICD-10-CM | POA: Diagnosis not present

## 2014-07-30 DIAGNOSIS — I5032 Chronic diastolic (congestive) heart failure: Secondary | ICD-10-CM | POA: Diagnosis not present

## 2014-07-30 DIAGNOSIS — Z880 Allergy status to penicillin: Secondary | ICD-10-CM | POA: Diagnosis not present

## 2014-07-30 DIAGNOSIS — E669 Obesity, unspecified: Secondary | ICD-10-CM | POA: Diagnosis not present

## 2014-07-30 DIAGNOSIS — I4891 Unspecified atrial fibrillation: Secondary | ICD-10-CM

## 2014-07-30 DIAGNOSIS — I509 Heart failure, unspecified: Secondary | ICD-10-CM | POA: Diagnosis not present

## 2014-07-30 DIAGNOSIS — Z885 Allergy status to narcotic agent status: Secondary | ICD-10-CM | POA: Diagnosis not present

## 2014-07-30 DIAGNOSIS — I272 Pulmonary hypertension, unspecified: Secondary | ICD-10-CM

## 2014-07-30 NOTE — Progress Notes (Signed)
Patient ID: [name], female   DOB: [birth year], 80 y.o.   MRN: [id number]  Referring Physician: Lovena [name] Primary Care: [name] Primary Cardiologist:[name]  HPI: [name] is a pleasant 80 yo woman with CAD, HTN, atrial fibrillation/ LA flutter and chronic, prior systolic heart failure, (which was likely rate related) and diastolic heart failure. She was referred by [name] for further evaluation of Pulmonary HTN.   Saw [name] earlier this year. Underwent 2D echo in 3/15 which showed EF 60-65% mild LVH. RV was normal. No significant valvular abnormalities.   Underwent R/L cath 05/07/14 Which showed stable CAD and significant PH  RA 6 RV 71/7 6 PA 63/17 (33)  PCWP 19 mm Hg  LV 174/16 mm Hg  AO 171/62 mean 103 mm Hg  PVR = 2.86 Oxygen saturations:  PA 69%  AO 99%  Cardiac Output/Index (Fick) 4.88/2.7    Left mainstem: Normal.  Left anterior descending (LAD): Moderate calcification proximally. Mild irregularities less than 10%. The first diagonal has 40-50% ostial disease.  Left circumflex (LCx): The LCx gives rise to a large OM1 then terminates in the AV groove. There is 20% disease in the proximal LCx. The first OM stent is patent with diffuse 20% disease.  Right coronary artery (RCA): The RCA arises anteriorly. There is an eccentric slit like stenosis in the proximal vessel to 60-70%. The mid vessel has segmental 70-80% disease.  Left ventriculography: Left ventricular systolic function is normal, LVEF is estimated at 55-65%, there is no significant mitral regurgitation   We saw her for an initial visit in May for pHTN and DOE. At that time she was quite dyspneic with activity. HR was in 40-50 range on carvedilol 50 bid and digoxin. We stopped her digoxin and cut carvedilol back to 25 bid. PFTs, VQ and CXR were ordered. I also asked her to decrease her H2O intake. We saw her back last month and she was feeling much better. Given results of PFTs overnight oximeter was  placed and referred to Dr. Gwenette [name].  Results:  05/21/14: VQ normal 5/15: Emphysema. Otherwise normal.  5/15: PFTs FEV1  1.4 (95%) FVC    1.89 (110%) FEV/FVC 88% FEF 25-75%  66% DLCO 39%  She has had overnight oximetry which showed oxygen desaturation as low as 83%, and spent 97 minutes less than 88% during the night.   Follow-up: Saw Dr. Gwenette [name] and is now pending sleep study. Nocturnal O2 was not started. Overall feeling better. Goes to [facility] 5 days per week. 2 days water yoga and 3 days water exercises. Was walking outside and got dyspneic. No edema, orthopnea or PND. No bleeding with Eliquis.  Frustrated with her weight gain (gained 20 pounds over past year).   Current Outpatient Prescriptions  Medication Sig Dispense Refill  . apixaban (ELIQUIS) 5 MG TABS tablet Take 1 tablet (5 mg total) by mouth 2 (two) times daily.  60 tablet  6  . Calcium-Magnesium-Zinc (CAL-MAG-ZINC PO) Take 1 tablet by mouth daily.       . carvedilol (COREG) 25 MG tablet Take 1 tablet (25 mg total) by mouth 2 (two) times daily with a meal.  360 tablet  1  . Cholecalciferol (VITAMIN D3) 1000 UNITS CAPS Take 1 capsule by mouth daily.       . folic acid (FOLVITE) 409 MCG tablet Take 800 mcg by mouth daily.      . furosemide (LASIX) 40 MG tablet Alternate but taking 80mg  twice a day one day and then  40mg  twice a day.      . insulin NPH Human (HUMULIN N,NOVOLIN N) 100 UNIT/ML injection Inject 8-22 Units into the skin 2 (two) times daily before a meal. 22 units in the morning (8 if you are going to be active (MWF)), and 14 units in the evening.      . insulin regular (NOVOLIN R,HUMULIN R) 100 units/mL injection Inject 8-22 Units into the skin 2 (two) times daily before a meal. 22 units in the morning (8 if you are going to be active (MWF)), and 14 units in the evening.      . methimazole (TAPAZOLE) 5 MG tablet Take 5 mg by mouth every Monday, Wednesday, and Friday.      . nitroGLYCERIN (NITROSTAT) 0.4 MG SL tablet Place  0.4 mg under the tongue every 5 (five) minutes as needed for chest pain.      . Omega-3 Fatty Acids (FISH OIL) 1200 MG CAPS Take 1 capsule by mouth daily.       . potassium chloride SA (K-DUR,KLOR-CON) 20 MEQ tablet Take 2 tabs every other day alternating with 1 tab every other day      . rosuvastatin (CRESTOR) 20 MG tablet Take 1 tablet (20 mg total) by mouth daily.  90 tablet  1  . tiotropium (SPIRIVA) 18 MCG inhalation capsule Place 1 capsule (18 mcg total) into inhaler and inhale daily.  90 capsule  1  . vitamin B-12 (CYANOCOBALAMIN) 1000 MCG tablet Take 1,000 mcg by mouth daily.         No current facility-administered medications for this encounter.    Allergies  Allergen Reactions  . Amlodipine Besylate     REACTION: tingling in lips & gum edema  . Lobster [Shellfish Allergy]     angioedema  . Penicillins     REACTION: rash  . Valsartan     REACTION: angioedema  . Codeine     Mental status changes  . Tramadol Hcl     REACTION: nausea   PHYSICAL EXAM: Filed Vitals:   07/30/14 1126  BP: 136/54  Pulse: 78  Weight: 175 lb 12.8 oz (79.742 kg)  SpO2: 93%    General:  Looks younger than stated age. Well appearing. No respiratory difficulty HEENT: normal Neck: supple. JVP 6. Carotids 2+ bilat; no bruits. No lymphadenopathy or thryomegaly appreciated. Cor: PMI nondisplaced.  Irregular rate & rhythm. No rubs, gallops or murmurs. Lungs: clear with diminished BS throughout. No wheeze Abdomen: soft, nontender, nondistended. No hepatosplenomegaly. No bruits or masses. Good bowel sounds. Extremities: no cyanosis, clubbing, rash, edema Neuro: alert & oriented x 3, cranial nerves grossly intact. moves all 4 extremities w/o difficulty. Affect pleasant.    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 Volume status  looks good. Pending sleep study 08/30/14.   Have encouraged her to restart her walking program to try to do 15-20 mins of walking 3-4x/week. Will refer to wt loss nutritionist to help with diet and management of DM2.   Continue eliquis 5 bid.   If BP stays up add norvasc 5  Would not repeat RHC unless she is feeling worse. We will see her back in 3 months  Quillian Quince Artha Chiasson,MD 11:47 AM

## 2014-07-30 NOTE — Addendum Note (Signed)
Encounter addended by: Scarlette Calico, RN on: 07/30/2014 12:15 PM<BR>     Documentation filed: Patient Instructions Section

## 2014-07-30 NOTE — Patient Instructions (Signed)
We will contact you in 3 months to schedule your next appointment.  

## 2014-08-06 ENCOUNTER — Telehealth: Payer: Self-pay | Admitting: Internal Medicine

## 2014-08-06 NOTE — Telephone Encounter (Signed)
Pt would like to est with dr kim. Can I sch? °

## 2014-08-07 NOTE — Telephone Encounter (Signed)
I advise she schedule with Dr. Vinson Moselle at [facility] or one of our new providers as my next available opening for new/transfer patients is in > then 6 months.

## 2014-08-07 NOTE — Telephone Encounter (Signed)
Pt is aware.  

## 2014-08-16 ENCOUNTER — Encounter: Payer: Self-pay | Admitting: Internal Medicine

## 2014-08-16 ENCOUNTER — Ambulatory Visit (INDEPENDENT_AMBULATORY_CARE_PROVIDER_SITE_OTHER): Payer: Medicare Other | Admitting: Internal Medicine

## 2014-08-16 ENCOUNTER — Ambulatory Visit (HOSPITAL_BASED_OUTPATIENT_CLINIC_OR_DEPARTMENT_OTHER): Payer: Medicare Other | Attending: Pulmonary Disease | Admitting: Radiology

## 2014-08-16 VITALS — Ht 63.0 in | Wt 175.0 lb

## 2014-08-16 VITALS — BP 152/68 | HR 86 | Temp 98.1°F | Ht 63.0 in | Wt 175.5 lb

## 2014-08-16 DIAGNOSIS — I4891 Unspecified atrial fibrillation: Secondary | ICD-10-CM | POA: Diagnosis not present

## 2014-08-16 DIAGNOSIS — E042 Nontoxic multinodular goiter: Secondary | ICD-10-CM

## 2014-08-16 DIAGNOSIS — G4733 Obstructive sleep apnea (adult) (pediatric): Secondary | ICD-10-CM

## 2014-08-16 DIAGNOSIS — M170 Bilateral primary osteoarthritis of knee: Secondary | ICD-10-CM

## 2014-08-16 DIAGNOSIS — M1710 Unilateral primary osteoarthritis, unspecified knee: Secondary | ICD-10-CM | POA: Insufficient documentation

## 2014-08-16 DIAGNOSIS — R635 Abnormal weight gain: Secondary | ICD-10-CM

## 2014-08-16 DIAGNOSIS — E162 Hypoglycemia, unspecified: Secondary | ICD-10-CM

## 2014-08-16 DIAGNOSIS — IMO0001 Reserved for inherently not codable concepts without codable children: Secondary | ICD-10-CM

## 2014-08-16 DIAGNOSIS — E1165 Type 2 diabetes mellitus with hyperglycemia: Secondary | ICD-10-CM

## 2014-08-16 DIAGNOSIS — I4949 Other premature depolarization: Secondary | ICD-10-CM | POA: Diagnosis not present

## 2014-08-16 DIAGNOSIS — E782 Mixed hyperlipidemia: Secondary | ICD-10-CM

## 2014-08-16 DIAGNOSIS — M179 Osteoarthritis of knee, unspecified: Secondary | ICD-10-CM | POA: Insufficient documentation

## 2014-08-16 NOTE — Patient Instructions (Signed)
Use an anti-inflammatory cream such as Aspercreme or Zostrix cream twice a day to the affected area as needed. In lieu of this warm moist compresses or  hot water bottle can be used. Do not apply ice . 

## 2014-08-16 NOTE — Progress Notes (Signed)
   Subjective:    Patient ID: [name], female    DOB: 11/19/1931, 80 y.o.   MRN: [id number]  HPI She is here in followup of her dyslipidemia but has multiple other concerns. Her complicated recent health history was reviewed.  She is on a heart healthy  diet; [facility] involves [facility] aerobics and yoga. She will be using the machines as well. Despite the exercise program she has gained 20 pounds. This is in the context of taking generic Tapazole for hyperthyroidism.  She has been compliant with her statin . She does have diffuse myalgias. She also has some memory issues. For example she had difficulty remembering her daughter's phone number which she calls her frequently. She has no associated gastrointestinal symptoms.   She's been seeing Dr. Loanne Drilling for diabetes. She's concerned as she's had profound hypoglycemia with glucoses in the 40s usually at approximately 4:30 AM.  She has had exertional dyspnea for which she saw Dr. Kendrick Ranch. He has diagnosed diastolic heart failure with secondary pulmonary hypertension . Because of nocturnal desaturation he has  recommended a sleep study which will be completed tonight.   Blood pressures ranges 125-130/60-70.     Review of Systems Abdominal pain, significant dyspepsia, dysphagia, melena, rectal bleeding, or persistently small caliber stools are denied.  She has severe pain in her knees  & has an appointment to see an orthopedist next week  She also has numbness along the fifth left digit which will extend up the lateral forearm as well.     Objective:   Physical Exam  Positive findings included the fact that she appears dramatically younger than her age. She has upper plate. The heart rhythm is slightly irregular. Breath sounds are decreased; she has no increased work of breathing. She has lipomatous changes around the patellae. She has isolated DIP changes of the hands. There is medial  deviation of the thumbs She does have marked crepitus of the knees.  General appearance :adequately nourished; in no distress. Eyes: No conjunctival inflammation or scleral icterus is present. Oral exam: Lips and gums are healthy appearing.There is no oropharyngeal erythema or exudate noted.  Heart:   S1 and S2 normal without gallop, murmur, click, rub or other extra sounds   Abdomen: bowel sounds normal, soft and non-tender without masses, organomegaly or hernias noted.  No guarding or rebound.  Skin:Warm & dry.  Intact without suspicious lesions or rashes ; no jaundice or tenting Lymphatic: No lymphadenopathy is noted about the head, neck, axilla           Assessment & Plan:  See Current Assessment & Plan in Problem List under specific Diagnosis

## 2014-08-16 NOTE — Progress Notes (Signed)
Pre visit review using our clinic review tool, if applicable. No additional management support is needed unless otherwise documented below in the visit note. 

## 2014-08-18 NOTE — Assessment & Plan Note (Signed)
Last A1c 9.6% in 01/15 yet early am hypoglycemia reported Recheck A1c F/U with Dr Loanne Drilling

## 2014-08-18 NOTE — Assessment & Plan Note (Signed)
Recheck TSH due to weight gain

## 2014-08-18 NOTE — Assessment & Plan Note (Addendum)
Myalgias described  Lipids, LFTs, TSH ,CK

## 2014-08-19 ENCOUNTER — Telehealth: Payer: Self-pay

## 2014-08-19 NOTE — Telephone Encounter (Signed)
Patient has been notified and given lab hours

## 2014-08-19 NOTE — Telephone Encounter (Signed)
Message copied by [name] Coss on Mon Aug 19, 2014  9:25 AM ------      Message from: Hendricks Limes      Created: Sun Aug 18, 2014 11:17 AM       I reviewed her chart & entered orders for fasting labs which can be done any am until 09/25/14 ------

## 2014-08-21 ENCOUNTER — Other Ambulatory Visit (INDEPENDENT_AMBULATORY_CARE_PROVIDER_SITE_OTHER): Payer: Medicare Other

## 2014-08-21 DIAGNOSIS — R635 Abnormal weight gain: Secondary | ICD-10-CM

## 2014-08-21 DIAGNOSIS — E042 Nontoxic multinodular goiter: Secondary | ICD-10-CM

## 2014-08-21 DIAGNOSIS — IMO0001 Reserved for inherently not codable concepts without codable children: Secondary | ICD-10-CM

## 2014-08-21 DIAGNOSIS — E782 Mixed hyperlipidemia: Secondary | ICD-10-CM

## 2014-08-21 DIAGNOSIS — E162 Hypoglycemia, unspecified: Secondary | ICD-10-CM

## 2014-08-21 DIAGNOSIS — E1165 Type 2 diabetes mellitus with hyperglycemia: Secondary | ICD-10-CM

## 2014-08-21 LAB — LIPID PANEL
Cholesterol: 219 mg/dL — ABNORMAL HIGH (ref 0–200)
HDL: 53.9 mg/dL (ref >39.00)
LDL Cholesterol: 146 mg/dL — ABNORMAL HIGH (ref 0–99)
NonHDL: 165.1
Total CHOL/HDL Ratio: 4
Triglycerides: 95 mg/dL (ref 0.0–149.0)
VLDL: 19 mg/dL (ref 0.0–40.0)

## 2014-08-21 LAB — HEPATIC FUNCTION PANEL
ALBUMIN: 3.7 g/dL (ref 3.5–5.2)
ALT: 21 U/L (ref 0–35)
AST: 29 U/L (ref 0–37)
Alkaline Phosphatase: 85 U/L (ref 39–117)
Bilirubin, Direct: 0 mg/dL (ref 0.0–0.3)
Total Bilirubin: 0.4 mg/dL (ref 0.2–1.2)
Total Protein: 8.2 g/dL (ref 6.0–8.3)

## 2014-08-21 LAB — TSH: TSH: 3.83 u[IU]/mL (ref 0.35–4.50)

## 2014-08-21 LAB — CK: Total CK: 106 U/L (ref 7–177)

## 2014-08-21 LAB — HEMOGLOBIN A1C: Hgb A1c MFr Bld: 8.1 % — ABNORMAL HIGH (ref 4.6–6.5)

## 2014-08-22 ENCOUNTER — Encounter: Payer: Self-pay | Admitting: *Deleted

## 2014-08-22 ENCOUNTER — Encounter: Payer: Medicare Other | Attending: Internal Medicine | Admitting: *Deleted

## 2014-08-22 VITALS — Ht 63.0 in | Wt 173.3 lb

## 2014-08-22 DIAGNOSIS — E1165 Type 2 diabetes mellitus with hyperglycemia: Principal | ICD-10-CM

## 2014-08-22 DIAGNOSIS — Z794 Long term (current) use of insulin: Secondary | ICD-10-CM | POA: Diagnosis not present

## 2014-08-22 DIAGNOSIS — IMO0001 Reserved for inherently not codable concepts without codable children: Secondary | ICD-10-CM | POA: Diagnosis present

## 2014-08-22 DIAGNOSIS — Z713 Dietary counseling and surveillance: Secondary | ICD-10-CM | POA: Diagnosis present

## 2014-08-22 NOTE — Progress Notes (Signed)
  Medical Nutrition Therapy:  Appt start time: 8295 end time:  1700.  Assessment:  Primary concerns today: 08/22/14. Lives with her son, her granddaughter is her aid by taking her to MD appointments, etc. She shops for her own foods and cooks her own meals. SMBG twice a day, FBG and varies either in afternoon or after supper. Reported range of 80-200 mg/dl. She also reports low BG during the night of 44 mg/dl and occasionally at other times of day. She has Glucose Tablets for treatment or drinks OJ or regular soda, then eats a sandwich with milk. She has always been active throughout her life and she still is by going to [facility] 5 days a week wear she participates in [employer], water yoga and arthritis water classes!  Preferred Learning Style:   No preference indicated   Learning Readiness:   Ready  Change in progress  MEDICATIONS: see list, diabetes medications are NPH insulin and Regular insulin she states due to financial constraints   DIETARY INTAKE:  24-hr recall:  B (9 AM): fresh fruit, egg, whole grain toast, 1/2 piece bacon, OR oatmeal pancakes, hot tea (hibiscus)  Snk ( AM): small piece of cake with cherries on top at [facility]  L (2 PM): Thai soup, fresh fruit, sunflower seeds, OR 1/2 sandwich with extra lettuce or spinach, mayo, fresh fruit and water and milk Snk ( PM): sunflower seeds, fresh fruit D (7:30 PM): lean meat, small serving of starch, vegetables and salad with olive oil, 1/2 Weight Watchers popsicle Snk ( PM): not usually, unless 1/2 slice bread with PNB, honey and 4 oz milk OR sunflower seeds Beverages: milk, water,   Usual physical activity: water yoga, arthritis yoga, Silver Sneakers  Estimated energy needs: 1200 calories 135 g carbohydrates 90 g protein 33 g fat  Progress Towards Goal(s):  In progress.   Nutritional Diagnosis:  NB-1.1 Food and nutrition-related knowledge deficit As related to diabetes.  As evidenced by A1c of 9.3%.    Intervention:   Nutrition counseling and diabetes education initiated. Discussed Carb Counting as method of portion control, reading food labels, and benefits of increased activity..  Plan:  Aim for 2 Carb Choices per meal (30 grams) +/- 1 either way  Aim for 0-1 Carbs per snack if hungry  Include protein in moderation with your meals and snacks Consider reading food labels for Total Carbohydrate of foods Continue with your activity level as tolerated Continue checking BG at alternate times per day as directed by MD   Teaching Method Utilized:  Visual Auditory Hands on  Handouts given during visit include: Living Well with Diabetes Carb Counting and Food Label handouts Meal Plan Card  Barriers to learning/adherence to lifestyle change: none  Demonstrated degree of understanding via:  Teach Back   Monitoring/Evaluation:  Dietary intake, exercise, reading food labels, and body weight in 6 week(s).

## 2014-08-22 NOTE — Patient Instructions (Signed)
Plan:  Aim for 2 Carb Choices per meal (30 grams) +/- 1 either way  Aim for 0-1 Carbs per snack if hungry  Include protein in moderation with your meals and snacks Consider reading food labels for Total Carbohydrate of foods Continue with your activity level as tolerated Continue checking BG at alternate times per day as directed by MD

## 2014-08-26 ENCOUNTER — Telehealth: Payer: Self-pay | Admitting: Internal Medicine

## 2014-08-26 NOTE — Telephone Encounter (Signed)
New message     Pt is trying to get assistance on her eliquis.  Until then, can she get 5mg  samples of the eliquis?  Call her and let her know if we have them.

## 2014-08-27 DIAGNOSIS — G4710 Hypersomnia, unspecified: Secondary | ICD-10-CM

## 2014-08-27 DIAGNOSIS — G4730 Sleep apnea, unspecified: Secondary | ICD-10-CM

## 2014-08-27 NOTE — Progress Notes (Signed)
Pt needs ov to review sleep study.  Thanks.  

## 2014-08-27 NOTE — Sleep Study (Signed)
   NAME: [name] DATE OF BIRTH:  03-06-1931 MEDICAL RECORD NUMBER [id number]  LOCATION: [facility]  PHYSICIAN: Gray OF STUDY: 08/16/2014  SLEEP STUDY TYPE: Nocturnal Polysomnogram               REFERRING PHYSICIAN: Clance, Armando Reichert, MD  INDICATION FOR STUDY: Hypersomnia with sleep apnea  EPWORTH SLEEPINESS SCORE:  2 HEIGHT: 5\' 3"  (160 cm)  WEIGHT: 175 lb (79.379 kg)    Body mass index is 31.01 kg/(m^2).  NECK SIZE: 15.5 in.  MEDICATIONS: Reviewed in the sleep record  SLEEP ARCHITECTURE: The patient had a total sleep time of 174 minutes with no slow-wave sleep and only 41 minutes of REM. Sleep onset latency was normal at 19 minutes, and REM onset was prolonged at 180 minutes. Sleep efficiency was poor at 47%.  RESPIRATORY DATA: The patient was found to have 2 apneas and 104 obstructive hypopneas, giving her an AHI of 37 events per hour. The events occurred in all body positions, and there was mild snoring noted throughout.  OXYGEN DATA: There was oxygen desaturation as low as 80% with the patient's obstructive events  CARDIAC DATA: The patient was noted to have atrial flutter at times during the night, as well as rare PVCs.  MOVEMENT/PARASOMNIA: There were no significant periodic limb movements or other abnormal behaviors seen.  IMPRESSION/ RECOMMENDATION:    1) severe obstructive sleep apnea/hypopnea syndrome, with an AHI of 37 events per hour and oxygen desaturation as low as 80%. Treatment for this degree of sleep apnea should focus on a trial of CPAP, and as well as weight loss.  2) the patient was noted to have episodes of atrial flutter, as well as rare PVC. Clinical correlation is suggested.     [place], American Board of Sleep Medicine  ELECTRONICALLY SIGNED ON:  08/27/2014, 5:50 PM [place] PH: [phone]   FX: [phone] [place]

## 2014-08-28 ENCOUNTER — Institutional Professional Consult (permissible substitution): Payer: Medicare Other | Admitting: Cardiology

## 2014-08-28 ENCOUNTER — Ambulatory Visit (INDEPENDENT_AMBULATORY_CARE_PROVIDER_SITE_OTHER): Payer: Medicare Other | Admitting: Pulmonary Disease

## 2014-08-28 ENCOUNTER — Encounter: Payer: Self-pay | Admitting: Pulmonary Disease

## 2014-08-28 VITALS — BP 126/88 | HR 78 | Temp 96.9°F | Ht 63.0 in | Wt 176.2 lb

## 2014-08-28 DIAGNOSIS — G4733 Obstructive sleep apnea (adult) (pediatric): Secondary | ICD-10-CM | POA: Insufficient documentation

## 2014-08-28 DIAGNOSIS — Z9989 Dependence on other enabling machines and devices: Secondary | ICD-10-CM

## 2014-08-28 NOTE — Patient Instructions (Signed)
Will start on cpap at a moderate pressure level.  Please call if having tolerance issues. Work on weight reduction followup with me again in 8 weeks.  

## 2014-08-28 NOTE — Assessment & Plan Note (Signed)
The patient has severe obstructive sleep apnea by her recent sleep study, and would benefit from a trial of CPAP while working on weight loss. The patient is agreeable to this approach. I will set the patient up on cpap at a moderate pressure level to allow for desensitization, and will troubleshoot the device over the next 4-6weeks if needed.  The pt is to call me if having issues with tolerance.  Will then optimize the pressure once patient is able to wear cpap on a consistent basis.

## 2014-08-28 NOTE — Progress Notes (Signed)
appt set for 09/06/14

## 2014-08-28 NOTE — Progress Notes (Signed)
   Subjective:    Patient ID: [name], female    DOB: 02/21/1931, 80 y.o.   MRN: [id number]  HPI The patient comes in today for followup of her recent sleep study, as part of a workup for nocturnal hypoxemia. She was found to have severe obstructive sleep apnea, with an AHI of 37 events per hour and desaturation as low as 80%. She was also noted to have a flutter and rare PVC. I have reviewed the study with her in detail, and answered all of her questions.   Review of Systems  Constitutional: Negative for fever and unexpected weight change.  HENT: Negative for congestion, dental problem, ear pain, nosebleeds, postnasal drip, rhinorrhea, sinus pressure, sneezing, sore throat and trouble swallowing.   Eyes: Negative for redness and itching.  Respiratory: Negative for cough, chest tightness, shortness of breath and wheezing.   Cardiovascular: Negative for palpitations and leg swelling.  Gastrointestinal: Negative for nausea and vomiting.  Genitourinary: Negative for dysuria.  Musculoskeletal: Negative for joint swelling.  Skin: Negative for rash.  Neurological: Negative for headaches.  Hematological: Does not bruise/bleed easily.  Psychiatric/Behavioral: Negative for dysphoric mood. The patient is not nervous/anxious.        Objective:   Physical Exam Overweight female in no acute distress Nose without purulence or discharge noted Neck without lymphadenopathy or thyromegaly Lower extremities with mild edema, no cyanosis Alert and oriented, moves all 4 extremities.       Assessment & Plan:

## 2014-08-29 ENCOUNTER — Encounter: Payer: Self-pay | Admitting: Internal Medicine

## 2014-08-29 ENCOUNTER — Telehealth: Payer: Self-pay | Admitting: *Deleted

## 2014-08-29 NOTE — Telephone Encounter (Signed)
Would require Cardiology clearance

## 2014-08-29 NOTE — Telephone Encounter (Signed)
Notified pt with md response.../lmb 

## 2014-08-29 NOTE — Telephone Encounter (Signed)
Pt is wanting md advisement on international traveling. She states her gran-daughter is getting married in November in [place]. She is thinking due to her health issues she may not be safe to travel. Also if she does what shot she needs to have before going...[name]

## 2014-08-30 ENCOUNTER — Encounter (HOSPITAL_BASED_OUTPATIENT_CLINIC_OR_DEPARTMENT_OTHER): Payer: Medicare Other

## 2014-08-30 ENCOUNTER — Telehealth: Payer: Self-pay | Admitting: Internal Medicine

## 2014-08-30 NOTE — Telephone Encounter (Signed)
New problem    Pt have some information for you to give to doctor concerning her going out of town to [place]. Please call pt.

## 2014-08-30 NOTE — Telephone Encounter (Signed)
I have asked her to contact Dr Haroldine Laws and Dr Gwenette Greet to ask there opinions about her issues and going to granddaughters wedding out of the country.  She is concerned in regards to travel but also all the medications you need to take prior to travel

## 2014-09-06 ENCOUNTER — Ambulatory Visit: Payer: Medicare Other | Admitting: Pulmonary Disease

## 2014-09-12 ENCOUNTER — Telehealth (HOSPITAL_COMMUNITY): Payer: Self-pay | Admitting: Vascular Surgery

## 2014-09-12 NOTE — Telephone Encounter (Signed)
Spoke w/pt, advised pt will discuss with Dr Haroldine Laws and call her tomorrow

## 2014-09-12 NOTE — Telephone Encounter (Signed)
Pt granddaughter is getting married in Nov in [place] the pt would like Linna Hoff to look over her records to make sure she can travel... She would like to hear from him with the answer ASAP

## 2014-09-16 NOTE — Telephone Encounter (Signed)
Per Dr Haroldine Laws he would not recommend pt taking the trip, pt is aware and agreeable

## 2014-09-18 ENCOUNTER — Ambulatory Visit: Payer: Medicare Other | Admitting: *Deleted

## 2014-10-01 ENCOUNTER — Telehealth (HOSPITAL_COMMUNITY): Payer: Self-pay | Admitting: Cardiology

## 2014-10-01 NOTE — Telephone Encounter (Signed)
Pt will need letter supporting travel restrictions  Tickets have been purchased already and airline is requiring letter from physician

## 2014-10-08 ENCOUNTER — Telehealth (HOSPITAL_COMMUNITY): Payer: Self-pay | Admitting: Vascular Surgery

## 2014-10-08 ENCOUNTER — Encounter: Payer: Medicare Other | Attending: Internal Medicine | Admitting: *Deleted

## 2014-10-08 VITALS — Ht 63.0 in | Wt 173.6 lb

## 2014-10-08 DIAGNOSIS — IMO0002 Reserved for concepts with insufficient information to code with codable children: Secondary | ICD-10-CM

## 2014-10-08 DIAGNOSIS — E1165 Type 2 diabetes mellitus with hyperglycemia: Secondary | ICD-10-CM

## 2014-10-08 DIAGNOSIS — Z794 Long term (current) use of insulin: Secondary | ICD-10-CM | POA: Insufficient documentation

## 2014-10-08 DIAGNOSIS — E118 Type 2 diabetes mellitus with unspecified complications: Secondary | ICD-10-CM | POA: Insufficient documentation

## 2014-10-08 NOTE — Telephone Encounter (Signed)
Pt needs the letter to get the money back for plan ticket.. Please advise

## 2014-10-08 NOTE — Patient Instructions (Signed)
Plan:  Aim for 2 Carb Choices per meal (30 grams) +/- 1 either way  Aim for 0-1 Carbs per snack if hungry  Include protein in moderation with your meals and snacks Consider reading food labels for Total Carbohydrate of foods Continue with your activity level as tolerated Continue checking BG at alternate times per day as directed by MD  Due to the low BGs you have been having, let's aim for the following plan:  M-W-F (exercise days) Pre breakfast: take 8 units R and 8 units NPH Pre supper: take 14 units R and 10 units NPH  S-T-R-S: (non exercise days) Pre breakfast: take 10 units R and 10 units NPH Pre supper: take 14 units R and 14 units NPH

## 2014-10-09 ENCOUNTER — Encounter (HOSPITAL_COMMUNITY): Payer: Self-pay | Admitting: Internal Medicine

## 2014-10-09 NOTE — Telephone Encounter (Signed)
ADVISED PT DR BENSIMHON RECENTLY WAS AT A CONFERENCE/CME AND UNAVAILABLE TO COMPLETE LETTER WOULD WORK ON HAVING PROVIDER COMPLETE BEFORE THE END OF THE WEEK.

## 2014-10-13 NOTE — Progress Notes (Signed)
Medical Nutrition Therapy:  Appt start time: 1600 end time:  1700.  Assessment:  Primary concerns today: 10/08/14. Lives with her son, her granddaughter is her aid by taking her to MD appointments, etc and she is here with her today. No significant weight change today.  She states she continues to go [employer] 5 days a week wear she participates in [employer], water yoga and arthritis water classes! She also reports continued hypoglycemia and is not sure what is causing it or what to do to prevent it.   Preferred Learning Style:   No preference indicated   Learning Readiness:   Ready  Change in progress  MEDICATIONS: see list, diabetes medications are NPH insulin and Regular insulin she states due to financial constraints   DIETARY INTAKE:  24-hr recall:  B (9 AM): fresh fruit, egg, whole grain toast, 1/2 piece bacon, OR oatmeal pancakes, hot tea (hibiscus)  Snk ( AM): small piece of cake with cherries on top at [facility]  L (2 PM): Thai soup, fresh fruit, sunflower seeds, OR 1/2 sandwich with extra lettuce or spinach, mayo, fresh fruit and water and milk Snk ( PM): sunflower seeds, fresh fruit D (7:30 PM): lean meat, small serving of starch, vegetables and salad with olive oil, 1/2 Weight Watchers popsicle Snk ( PM): not usually, unless 1/2 slice bread with PNB, honey and 4 oz milk OR sunflower seeds Beverages: milk, water,   Usual physical activity: water yoga, arthritis yoga, Silver Sneakers  Estimated energy needs: 1200 calories 135 g carbohydrates 90 g protein 33 g fat  Progress Towards Goal(s):  In progress.   Nutritional Diagnosis:  NB-1.1 Food and nutrition-related knowledge deficit As related to diabetes.  As evidenced by A1c of 9.3%.    Intervention:  We concentrated on insulin action today. Her MD has prescribed a range of doses as follows: AM: 8-22 units Regular AM: 8-22 units NPH PM: 14 units Regular PM: 14 units NPH Due to her excellent exercise habits at the  [facility] on Mon-Wed-Fri and noting most of the hypoglycemia in the evenings following these exercise dates, I have recommended she take only 8 units of each insulin in the AM each day she exercises and 10 units on the other days and to reduce her PM dose of NPH insulin from 14 to 10 units on the days she does exercise to help prevent the hypoglycemia during those nights. She expressed good understanding of the rationale of these doses and willingness to follow through.  These guidelines are all within the sliding scale included in the MD's Rx.  Plan:  Aim for 2 Carb Choices per meal (30 grams) +/- 1 either way  Aim for 0-1 Carbs per snack if hungry  Include protein in moderation with your meals and snacks Consider reading food labels for Total Carbohydrate of foods Continue with your activity level as tolerated Continue checking BG at alternate times per day as directed by MD  Due to the low BGs you have been having, let's aim for the following plan:  M-W-F (exercise days) Pre breakfast: take 8 units R and 8 units NPH Pre supper: take 14 units R and 10 units NPH  S-T-R-S: (non exercise days) Pre breakfast: take 10 units R and 10 units NPH Pre supper: take 14 units R and 14 units NPH    Teaching Method Utilized: Visual, Auditory and Hands on  Handouts given during visit include: Insulin Action handout  Barriers to learning/adherence to lifestyle change: none  Demonstrated  degree of understanding via:  Teach Back   Monitoring/Evaluation:  Dietary intake, exercise, reading food labels, insulin administration, and body weight in 3 week(s).

## 2014-10-23 ENCOUNTER — Ambulatory Visit (INDEPENDENT_AMBULATORY_CARE_PROVIDER_SITE_OTHER): Payer: Medicare Other | Admitting: Pulmonary Disease

## 2014-10-23 ENCOUNTER — Encounter: Payer: Self-pay | Admitting: Pulmonary Disease

## 2014-10-23 VITALS — BP 132/68 | HR 86 | Temp 97.0°F | Ht 63.0 in | Wt 174.0 lb

## 2014-10-23 DIAGNOSIS — G4733 Obstructive sleep apnea (adult) (pediatric): Secondary | ICD-10-CM

## 2014-10-23 NOTE — Patient Instructions (Signed)
Will increase pressure on your device to better treat your "bad nights". Keep working on weight loss followup with me again in 43mos, but call if having issues.

## 2014-10-23 NOTE — Progress Notes (Signed)
   Subjective:    Patient ID: [name], female    DOB: 05/19/1931, 80 y.o.   MRN: [id number]  HPI The patient comes in today for follow-up of her obstructive sleep apnea. She was started on Cipro At the last visit, and has done fairly well with the device. Her download shows improving compliance, but she is having some breakthrough apnea on her current pressure. She feels she is definitely sleeping better, and has seen improved daytime alertness.   Review of Systems  Constitutional: Negative for fever and unexpected weight change.  HENT: Positive for postnasal drip, rhinorrhea and sinus pressure. Negative for congestion, dental problem, ear pain, nosebleeds, sneezing, sore throat and trouble swallowing.   Eyes: Negative for redness and itching.  Respiratory: Positive for shortness of breath. Negative for cough, chest tightness and wheezing.   Cardiovascular: Negative for palpitations and leg swelling.  Gastrointestinal: Negative for nausea and vomiting.  Genitourinary: Negative for dysuria.  Musculoskeletal: Negative for joint swelling.  Skin: Negative for rash.  Neurological: Negative for headaches.  Hematological: Does not bruise/bleed easily.  Psychiatric/Behavioral: Negative for dysphoric mood. The patient is not nervous/anxious.        Objective:   Physical Exam Well-developed female in no acute distress Nose without purulence or discharge noted No skin breakdown or pressure necrosis from the sleep mask Neck without lymphadenopathy or thyromegaly Lower extremities with minimal edema, no cyanosis Alert and oriented, moves all 4 extremities.       Assessment & Plan:

## 2014-10-23 NOTE — Assessment & Plan Note (Signed)
The patient is doing well with C Pap, and has seen improvement in her sleep and daytime alertness. She is wearing C Pap more compliantly by her download, but does need a little more pressure on some nights. Will therefore increase her auto setting to 5-15. I have also encouraged her to work more aggressively on weight loss.

## 2014-10-24 ENCOUNTER — Encounter: Payer: Medicare Other | Admitting: *Deleted

## 2014-10-24 VITALS — Ht 63.0 in | Wt 170.9 lb

## 2014-10-24 DIAGNOSIS — E119 Type 2 diabetes mellitus without complications: Secondary | ICD-10-CM

## 2014-10-24 DIAGNOSIS — E118 Type 2 diabetes mellitus with unspecified complications: Secondary | ICD-10-CM | POA: Diagnosis not present

## 2014-10-24 NOTE — Progress Notes (Signed)
Medical Nutrition Therapy:  Appt start time: 1400 end time:  1430.  Assessment:  Primary concerns today: 10/23/14. This is a follow up visit for her diabetes. She is happy with 3 pound weight loss since last visit 2 weeks ago. She has implemented the insulin adjustments we discussed, taking less insulin on days that she exercises at the gym. She has not had any hypoglycemia in the past 2 weeks as well! Her Log Sheets indicate most BG's are closer to target both before and after meals. She requested a copy of a sample meal plan as a back up in case she was in a hurry and was not able to plan it herself. She is again here with her Granddaughter who continues to appear supportive and drives her to her appointments.  Preferred Learning Style:   No preference indicated   Learning Readiness:   Ready  Change in progress  MEDICATIONS: see list, diabetes medications are NPH insulin and Regular insulin she states due to financial constraints   DIETARY INTAKE:  24-hr recall:  B (9 AM): fresh fruit, egg, whole grain toast, 1/2 piece bacon, OR oatmeal pancakes, hot tea (hibiscus)  Snk ( AM): small piece of cake with cherries on top at [facility]  L (2 PM): Thai soup, fresh fruit, sunflower seeds, OR 1/2 sandwich with extra lettuce or spinach, mayo, fresh fruit and water and milk Snk ( PM): sunflower seeds, fresh fruit D (7:30 PM): lean meat, small serving of starch, vegetables and salad with olive oil, 1/2 Weight [employer]chers popsicle Snk ( PM): not usually, unless 1/2 slice bread with PNB, honey and 4 oz milk OR sunflower seeds Beverages: milk, water,   Usual physical activity: water yoga, arthritis yoga, Silver Sneakers  Estimated energy needs: 1200 calories 135 g carbohydrates 90 g protein 33 g fat  Progress Towards Goal(s):  In progress.   Nutritional Diagnosis:  NB-1.1 Food and nutrition-related knowledge deficit As related to diabetes.  As evidenced by A1c of 9.3%.    Intervention:  I  reviewed her Log Sheets that she completed as I requested, including her BG, time of day and whether before or after a meal, and the insulin dose based on whether she exercised that AM or not. Commended her on her improved BG numbers and the fact that she no longer has had any hypoglycemia and has had a 3 pound weight loss!  We concentrated on insulin action today. Her MD has prescribed a range of doses as follows: AM: 8-22 units Regular AM: 8-22 units NPH PM: 14 units Regular PM: 14 units NPH Due to her excellent exercise habits at the [facility] on Mon-Wed-Fri and noting most of the hypoglycemia in the evenings following these exercise dates, I have recommended she take only 8 units of each insulin in the AM each day she exercises and 10 units on the other days and to reduce her PM dose of NPH insulin from 14 to 10 units on the days she does exercise to help prevent the hypoglycemia during those nights. She expressed good understanding of the rationale of these doses and willingness to follow through.  These guidelines are all within the sliding scale included in the MD's Rx.  Plan:  Continue to aim for 2 Carb Choices per meal (30 grams) +/- 1 either way  Continue to aim for 0-1 Carbs per snack if hungry  Include protein in moderation with your meals and snacks Continue reading food labels for Total Carbohydrate of foods Continue with your  activity level as tolerated Continue checking BG at alternate times per day and record on the Log Sheet I provided you  Due to the success of the insulin guidelines we discussed at our last visit, continue with the following plan:  M-W-F (exercise days) Pre breakfast: take 8 units R and 8 units NPH Pre supper: take 14 units R and 10 units NPH  S-T-R-S: (non exercise days) Pre breakfast: take 10 units R and 10 units NPH Pre supper: take 14 units R and 14 units NPH    Teaching Method Utilized: Visual, Auditory and Hands on  Handouts given during visit include: Log  Sheet for BG Sample 1200 Calorie Meal Plan  Barriers to learning/adherence to lifestyle change: none  Demonstrated degree of understanding via:  Teach Back   Monitoring/Evaluation:  Dietary intake, exercise, reading food labels, insulin administration, and body weight in 5-6 week(s).

## 2014-10-25 NOTE — Patient Instructions (Addendum)
Plan:  Continue to aim for 2 Carb Choices per meal (30 grams) +/- 1 either way  Continue to aim for 0-1 Carbs per snack if hungry  Include protein in moderation with your meals and snacks Continue reading food labels for Total Carbohydrate of foods Continue with your activity level as tolerated Continue checking BG at alternate times per day and record on the Log Sheet I provided you  Due to the success of the insulin guidelines we discussed at our last visit, continue with the following plan:  M-W-F (exercise days) Pre breakfast: take 8 units R and 8 units NPH Pre supper: take 14 units R and 10 units NPH  S-T-R-S: (non exercise days) Pre breakfast: take 10 units R and 10 units NPH Pre supper: take 14 units R and 14 units NPH

## 2014-10-29 ENCOUNTER — Encounter (HOSPITAL_COMMUNITY): Payer: Self-pay

## 2014-10-29 ENCOUNTER — Ambulatory Visit (HOSPITAL_COMMUNITY)
Admission: RE | Admit: 2014-10-29 | Discharge: 2014-10-29 | Disposition: A | Discharge status: Home / Self Care | Payer: Medicare Other | Source: Ambulatory Visit | Attending: Internal Medicine | Admitting: Internal Medicine

## 2014-10-29 VITALS — BP 134/58 | HR 82 | Wt 173.4 lb

## 2014-10-29 DIAGNOSIS — E663 Overweight: Secondary | ICD-10-CM | POA: Insufficient documentation

## 2014-10-29 DIAGNOSIS — G4733 Obstructive sleep apnea (adult) (pediatric): Secondary | ICD-10-CM | POA: Diagnosis not present

## 2014-10-29 DIAGNOSIS — I270 Primary pulmonary hypertension: Secondary | ICD-10-CM

## 2014-10-29 DIAGNOSIS — I482 Chronic atrial fibrillation, unspecified: Secondary | ICD-10-CM

## 2014-10-29 DIAGNOSIS — I10 Essential (primary) hypertension: Secondary | ICD-10-CM | POA: Insufficient documentation

## 2014-10-29 DIAGNOSIS — I272 Other secondary pulmonary hypertension: Secondary | ICD-10-CM | POA: Insufficient documentation

## 2014-10-29 DIAGNOSIS — I5032 Chronic diastolic (congestive) heart failure: Secondary | ICD-10-CM | POA: Diagnosis not present

## 2014-10-29 DIAGNOSIS — R001 Bradycardia, unspecified: Secondary | ICD-10-CM | POA: Insufficient documentation

## 2014-10-29 DIAGNOSIS — R0600 Dyspnea, unspecified: Secondary | ICD-10-CM

## 2014-10-29 NOTE — Progress Notes (Signed)
Patient ID: [name], female   DOB: 1931-07-25, 80 y.o.   MRN: [id number]  Referring Physician: Lovena Le Primary Care: Linna Darner Primary Cardiologist:Taylor  HPI: [name] is a pleasant 80 yo woman with CAD, HTN, atrial fibrillation/ LA flutter and chronic prior systolic heart failure, (which was likely rate related) and diastolic heart failure. She was referred by Dr. Lovena Le for further evaluation of Pulmonary HTN.   Saw Dr. Lovena Le earlier this year. Underwent 2D echo in 3/15 which showed EF 60-65% mild LVH. RV was normal. No significant valvular abnormalities.   Underwent R/L cath 05/07/14 Which showed stable CAD and significant PH  RA 6 RV 71/7 6 PA 63/17 (33)  PCWP 19 mm Hg  LV 174/16 mm Hg  AO 171/62 mean 103 mm Hg  PVR = 2.86 Oxygen saturations:  PA 69%  AO 99%  Cardiac Output/Index (Fick) 4.88/2.7    Left mainstem: Normal.  Left anterior descending (LAD): Moderate calcification proximally. Mild irregularities less than 10%. The first diagonal has 40-50% ostial disease.  Left circumflex (LCx): The LCx gives rise to a large OM1 then terminates in the AV groove. There is 20% disease in the proximal LCx. The first OM stent is patent with diffuse 20% disease.  Right coronary artery (RCA): The RCA arises anteriorly. There is an eccentric slit like stenosis in the proximal vessel to 60-70%. The mid vessel has segmental 70-80% disease.  Left ventriculography: Left ventricular systolic function is normal, LVEF is estimated at 55-65%, there is no significant mitral regurgitation   We saw her for an initial visit in May for pHTN and DOE. At that time she was quite dyspneic with activity. HR was in 40-50 range on carvedilol 50 bid and digoxin. We stopped her digoxin and cut carvedilol back to 25 bid. PFTs, VQ and CXR were ordered. I also asked her to decrease her H2O intake. We saw her back last month and she was feeling much better. Given results of PFTs overnight oximeter was placed  and referred to Dr. Gwenette Greet. She has had overnight oximetry which showed oxygen desaturation as low as 83%, and spent 97 minutes less than 88% during the night.   Results:  05/21/14: VQ normal 5/15: Emphysema. Otherwise normal.  5/15: PFTs FEV1  1.4 (95%) FVC    1.89 (110%) FEV/FVC 88% FEF 25-75%  66% DLCO 39%  Follow-up: Saw Dr. Gwenette Greet and diagnosed with OSA. CPAP recently titrated.  Also seeing a nutritionist to help with weight loss.. Overall feeling better but still frustrated with exertional dyspnea at less than 50 yards. No CP. No edema/orthopnea/PND.  Goes to [facility] 3 days per week doing water exercises and doing Nu-step. No bleeding with Eliquis.     Current Outpatient Prescriptions  Medication Sig Dispense Refill  . apixaban (ELIQUIS) 5 MG TABS tablet Take 1 tablet (5 mg total) by mouth 2 (two) times daily. 60 tablet 6  . Calcium-Magnesium-Zinc (CAL-MAG-ZINC PO) Take 1 tablet by mouth daily.     . carvedilol (COREG) 25 MG tablet Take 1 tablet (25 mg total) by mouth 2 (two) times daily with a meal. 360 tablet 1  . Cholecalciferol (VITAMIN D3) 1000 UNITS CAPS Take 1 capsule by mouth daily.     . folic acid (FOLVITE) 025 MCG tablet Take 800 mcg by mouth daily.    . furosemide (LASIX) 40 MG tablet Take 80 mg by mouth daily.     . insulin NPH Human (HUMULIN N,NOVOLIN N) 100 UNIT/ML injection Inject 8-22 Units into  the skin 2 (two) times daily before a meal. 22 units in the morning (8 if you are going to be active (MWF)), and 14 units in the evening.    . insulin regular (NOVOLIN R,HUMULIN R) 100 units/mL injection Inject 8-22 Units into the skin 2 (two) times daily before a meal. 22 units in the morning (8 if you are going to be active (MWF)), and 14 units in the evening.    . methimazole (TAPAZOLE) 5 MG tablet Take 5 mg by mouth every Monday, Wednesday, and Friday.    . nitroGLYCERIN (NITROSTAT) 0.4 MG SL tablet Place 0.4 mg under the tongue every 5 (five) minutes as needed for chest pain.     . Omega-3 Fatty Acids (FISH OIL) 1200 MG CAPS Take 1 capsule by mouth daily.     . potassium chloride SA (K-DUR,KLOR-CON) 20 MEQ tablet Take 2 tabs every other day alternating with 1 tab every other day    . rosuvastatin (CRESTOR) 20 MG tablet Take 1 tablet (20 mg total) by mouth daily. 90 tablet 1  . tiotropium (SPIRIVA) 18 MCG inhalation capsule Place 1 capsule (18 mcg total) into inhaler and inhale daily. 90 capsule 1  . vitamin B-12 (CYANOCOBALAMIN) 1000 MCG tablet Take 1,000 mcg by mouth daily.       No current facility-administered medications for this encounter.    Allergies  Allergen Reactions  . Amlodipine Besylate     REACTION: tingling in lips & gum edema  . Lobster [Shellfish Allergy]     angioedema  . Penicillins     REACTION: rash  . Valsartan     REACTION: angioedema  . Codeine     Mental status changes  . Tramadol Hcl     REACTION: nausea   PHYSICAL EXAM: Filed Vitals:   10/29/14 1057  BP: 134/58  Pulse: 82  Weight: 173 lb 6.4 oz (78.654 kg)  SpO2: 94%    General:  Looks younger than stated age. Well appearing. No respiratory difficulty HEENT: normal Neck: supple. JVP 6. Carotids 2+ bilat; no bruits. No lymphadenopathy or thryomegaly appreciated. Cor: PMI nondisplaced.  Irregular rate & rhythm. No rubs, gallops or murmurs. Lungs: clear with diminished BS throughout. No wheeze Abdomen: soft, nontender, nondistended. No hepatosplenomegaly. No bruits or masses. Good bowel sounds. Extremities: no cyanosis, clubbing, rash, edema Neuro: alert & oriented x 3, cranial nerves grossly intact. moves all 4 extremities w/o difficulty. Affect pleasant.   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 May need to cut carvedilol back a bit more. Volume  status looks good. We have discussed whether her symptoms are related to overly high expectation or if she is really physiologically limited. Will proceed with CPX testing to quantify functional capacity and help identify if the primary limiting factor is cardiac, pulmonary or other. We discussed this at length and she wants to proceed.   Continue eliquis 5 bid for AF.   Total time spent 35 minutes. Over half that time spent discussing above.   Daniel Bensimhon,MD 11:28 AM

## 2014-10-29 NOTE — Addendum Note (Signed)
Encounter addended by: Kerry Dory, CMA on: 10/29/2014 11:48 AM<BR>     Documentation filed: Visit Diagnoses, Dx Association, Patient Instructions Section, Orders

## 2014-10-29 NOTE — Patient Instructions (Signed)
Your physician has recommended that you have a cardiopulmonary stress test (CPX). CPX testing is a non-invasive measurement of heart and lung function. It replaces a traditional treadmill stress test. This type of test provides a tremendous amount of information that relates not only to your present condition but also for future outcomes. This test combines measurements of you ventilation, respiratory gas exchange in the lungs, electrocardiogram (EKG), blood pressure and physical response before, during, and following an exercise protocol.  Your physician recommends that you schedule a follow-up appointment in: 3 months with MD

## 2014-11-15 ENCOUNTER — Other Ambulatory Visit (INDEPENDENT_AMBULATORY_CARE_PROVIDER_SITE_OTHER): Payer: Medicare Other

## 2014-11-15 ENCOUNTER — Other Ambulatory Visit: Payer: Self-pay | Admitting: Internal Medicine

## 2014-11-15 DIAGNOSIS — IMO0002 Reserved for concepts with insufficient information to code with codable children: Secondary | ICD-10-CM

## 2014-11-15 DIAGNOSIS — E1165 Type 2 diabetes mellitus with hyperglycemia: Secondary | ICD-10-CM

## 2014-11-15 LAB — HEMOGLOBIN A1C: Hgb A1c MFr Bld: 8.7 % — ABNORMAL HIGH (ref 4.6–6.5)

## 2014-11-18 ENCOUNTER — Telehealth: Payer: Self-pay | Admitting: Internal Medicine

## 2014-11-18 NOTE — Telephone Encounter (Signed)
This message was given to Dr Linna Darner. He states patient had labs and message came from Dr [facility] office.

## 2014-11-18 NOTE — Telephone Encounter (Signed)
Patient is in lobby.  She states she was told to come for lab work and then to come in this morning for a follow up in regards to labs.  I do not see anything in her past or present appointments where anything was scheduled.  She is requesting to be worked in this morning.  Please advise.

## 2014-11-29 ENCOUNTER — Other Ambulatory Visit: Payer: Self-pay

## 2014-11-29 NOTE — Telephone Encounter (Signed)
Gave patient 30 day supply of eliquis 5 mg

## 2014-12-03 ENCOUNTER — Ambulatory Visit: Payer: Medicare Other | Admitting: *Deleted

## 2014-12-05 ENCOUNTER — Encounter (HOSPITAL_COMMUNITY): Payer: Self-pay | Admitting: Cardiology

## 2014-12-10 ENCOUNTER — Ambulatory Visit (INDEPENDENT_AMBULATORY_CARE_PROVIDER_SITE_OTHER): Payer: Medicare Other | Admitting: Endocrinology

## 2014-12-10 VITALS — Ht 63.0 in

## 2014-12-10 DIAGNOSIS — E1159 Type 2 diabetes mellitus with other circulatory complications: Secondary | ICD-10-CM

## 2014-12-10 MED ORDER — INSULIN ISOPHANE HUMAN 100 UNIT/ML KWIKPEN: 60.0000 [IU] | PEN_INJECTOR | SUBCUTANEOUS | Status: DC

## 2014-12-10 NOTE — Patient Instructions (Addendum)
check your blood sugar twice a day.  vary the time of day when you check, between before the 3 meals, and at bedtime.  also check if you have symptoms of your blood sugar being too high or too low.  please keep a record of the readings and bring it to your next appointment here.  please call us sooner if your blood sugar goes below 70, or if you have a lot of readings over 200.   Please stop taking the regular insulin, and:  Take the NPH, 60 units each morning, and none in the evening.  i have sent a prescription to your pharmacy.   Please call next week to tell us how your blood sugar is doing.   Please come back for a follow-up appointment in 1 month.

## 2014-12-10 NOTE — Progress Notes (Signed)
Subjective:    Patient ID: [name], female    DOB: 14-Oct-1931, 80 y.o.   MRN: [id number]  HPI  Pt returns for f/u of diabetes mellitus: DM type: Insulin-requiring type 2 Dx'ed: 5366 Complications: CAD Therapy: insulin since 2007 GDM: never DKA: never Severe hypoglycemia: never Pancreatitis: never Other: therapy limited by pt's request for least expensive insulins Interval history: she brings a record of her cbg's which i have reviewed today.  It varies from 50-150.  It is lowest in the middle of the night, and highest at other times of day.  Pt says she wants to reduce the insulin to once a day.  Pt says she now takes a total of 76 units units per day. Past Medical History  Diagnosis Date  . Hypopotassemia     PMH of  . Anemia     iron defficiency  . Type II or unspecified type diabetes mellitus without mention of complication, uncontrolled     Dr Loanne Drilling  . Muscle pain   . Degenerative joint disease   . Atrial fibrillation or flutter     s/p RFCA 7/08;   s/p DCCV in past;   previously on amiodarone;  amio stopped due to lung toxicity  . CAD (coronary artery disease)     s/p NSTEMI tx with BMS to OM1 3/08;  cath 3/08: pOM 99% tx with PCI, pLAD 20%, ? mod stenosis at the AM  . HLD (hyperlipidemia)   . HTN (hypertension)     essential nos  . Dystrophy, corneal stromal   . Chronic diastolic heart failure     echo 11/11:  EF 55-60%, severe LVH, mod LAE, mild MR, mildly increased PASP  . Allergy     seasonal  . Cataract   . CHF (congestive heart failure)   . Myocardial infarction 2008  . Osteoporosis   . Thyroid disease     hypothyroidism    Past Surgical History  Procedure Laterality Date  . Cholecystectomy    . Knee surgery    . Eye surgery      cataract surgery & corneal implant bilaterally  . A flutter ablation      Dr Lovena Le  . Gravid 2 para 2    . Colonoscopy  6/07    2 polyps Dr. Kinnie Feil in [place]  . Bare metal stent  02/2007    Dr Lovena Le  .  Colonoscopy with polypectomy       X 2; [facility]  . Left and right heart catheterization with coronary angiogram N/A 05/07/2014    Procedure: LEFT AND RIGHT HEART CATHETERIZATION WITH CORONARY ANGIOGRAM;  Surgeon: Peter M Martinique, MD;  Location: [facility] CATH LAB;  Service: Cardiovascular;  Laterality: N/A;    History   Social History  . Marital Status: Widowed    Spouse Name: N/A    Number of Children: N/A  . Years of Education: N/A   Occupational History  . Teacher    Social History Main Topics  . Smoking status: Former Smoker -- 0.25 packs/day for 18 years    Types: Cigarettes    Quit date: 12/27/1968  . Smokeless tobacco: Never Used     Comment: smoked [place], up to 1 pp week  . Alcohol Use: No  . Drug Use: No  . Sexual Activity: Not on file   Other Topics Concern  . Not on file   Social History Narrative   Teacher. Widowed. Rarely drinks cafeine.     Current  Outpatient Prescriptions on File Prior to Visit  Medication Sig Dispense Refill  . apixaban (ELIQUIS) 5 MG TABS tablet Take 1 tablet (5 mg total) by mouth 2 (two) times daily. 60 tablet 6  . Calcium-Magnesium-Zinc (CAL-MAG-ZINC PO) Take 1 tablet by mouth daily.     . carvedilol (COREG) 25 MG tablet Take 1 tablet (25 mg total) by mouth 2 (two) times daily with a meal. 360 tablet 1  . Cholecalciferol (VITAMIN D3) 1000 UNITS CAPS Take 1 capsule by mouth daily.     . folic acid (FOLVITE) 161 MCG tablet Take 800 mcg by mouth daily.    . furosemide (LASIX) 40 MG tablet Take 80 mg by mouth daily.     . methimazole (TAPAZOLE) 5 MG tablet Take 5 mg by mouth every Monday, Wednesday, and Friday.    . nitroGLYCERIN (NITROSTAT) 0.4 MG SL tablet Place 0.4 mg under the tongue every 5 (five) minutes as needed for chest pain.    . Omega-3 Fatty Acids (FISH OIL) 1200 MG CAPS Take 1 capsule by mouth daily.     . potassium chloride SA (K-DUR,KLOR-CON) 20 MEQ tablet Take 2 tabs every other day alternating with 1 tab every other day      . rosuvastatin (CRESTOR) 20 MG tablet Take 1 tablet (20 mg total) by mouth daily. 90 tablet 1  . vitamin B-12 (CYANOCOBALAMIN) 1000 MCG tablet Take 1,000 mcg by mouth daily.      Marland Kitchen tiotropium (SPIRIVA) 18 MCG inhalation capsule Place 1 capsule (18 mcg total) into inhaler and inhale daily. (Patient not taking: Reported on 12/10/2014) 90 capsule 1   No current facility-administered medications on file prior to visit.    Allergies  Allergen Reactions  . Amlodipine Besylate     REACTION: tingling in lips & gum edema  . Lobster [Shellfish Allergy]     angioedema  . Penicillins     REACTION: rash  . Valsartan     REACTION: angioedema  . Codeine     Mental status changes  . Tramadol Hcl     REACTION: nausea    Family History  Problem Relation Age of Onset  . Diabetes Mother   . Hypertension Mother   . Transient ischemic attack Mother   . Heart attack Father 85  . Arthritis Father   . Breast cancer Maternal Aunt   . Arthritis Maternal Aunt   . Arthritis Mother   . Hypertension Father     Ht 5\' 3"  (1.6 m)  Review of Systems Denies LOC and weight change.     Objective:   Physical Exam VITAL SIGNS:  See vs page GENERAL: no distress Pulses: dorsalis pedis intact bilat.   Feet: no deformity.  no edema Skin:  no ulcer on the feet.  normal color and temp. Neuro: sensation is intact to touch on the feet.    Lab Results  Component Value Date   HGBA1C 8.7* 11/15/2014      Assessment & Plan:  DM: moderate exacerbation Noncompliance with cbg recording and f/u ov's: I'll work around this as best I can, which is to simplify her regimen, at her request.  CAD: in this context, she should avoid hypoglycemia.  Therefore, we'll reduce her total daily insulin dosage.    Patient is advised the following: Patient Instructions  check your blood sugar twice a day.  vary the time of day when you check, between before the 3 meals, and at bedtime.  also check if you have symptoms of  your  blood sugar being too high or too low.  please keep a record of the readings and bring it to your next appointment here.  please call us sooner if your blood sugar goes below 70, or if you have a lot of readings over 200.   Please stop taking the regular insulin, and:  Take the NPH, 60 units each morning, and none in the evening.  i have sent a prescription to your pharmacy.   Please call next week to tell us how your blood sugar is doing.   Please come back for a follow-up appointment in 1 month.

## 2014-12-16 ENCOUNTER — Ambulatory Visit (INDEPENDENT_AMBULATORY_CARE_PROVIDER_SITE_OTHER): Payer: Medicare Other | Admitting: Geriatric Medicine

## 2014-12-16 DIAGNOSIS — Z23 Encounter for immunization: Secondary | ICD-10-CM

## 2014-12-16 DIAGNOSIS — Z299 Encounter for prophylactic measures, unspecified: Secondary | ICD-10-CM

## 2014-12-17 ENCOUNTER — Encounter: Payer: Self-pay | Admitting: Endocrinology

## 2014-12-17 ENCOUNTER — Telehealth: Payer: Self-pay | Admitting: Endocrinology

## 2014-12-17 NOTE — Telephone Encounter (Signed)
Please see below.

## 2014-12-17 NOTE — Telephone Encounter (Signed)
Pt left message wants call back from [facility] regarding blood sugar levels.

## 2014-12-17 NOTE — Telephone Encounter (Signed)
Requested call back from pt to discuss.

## 2014-12-18 NOTE — Telephone Encounter (Signed)
Pt sent reading via my chart. Reading forwarded to MD.

## 2014-12-24 ENCOUNTER — Other Ambulatory Visit: Payer: Self-pay | Admitting: Internal Medicine

## 2014-12-26 ENCOUNTER — Ambulatory Visit: Payer: Medicare Other | Admitting: *Deleted

## 2014-12-30 ENCOUNTER — Telehealth: Payer: Self-pay | Admitting: Internal Medicine

## 2014-12-30 MED ORDER — ROSUVASTATIN CALCIUM 20 MG PO TABS: 20.0000 mg | ORAL_TABLET | Freq: Every day | ORAL | Status: DC

## 2014-12-30 NOTE — Telephone Encounter (Signed)
Pt called and needs refill on her rosuvastatin (CRESTOR) 20 MG tablet [[id number]]  Only 30 day supply  She would like it sent to:  [facility]  Address: [address] [place], [place] [address]  [phone]   Pt is going to drop off form for Dr Linna Darner to complete to continue to get her Crestor for free.  She said they need the form updated

## 2015-01-07 ENCOUNTER — Encounter: Payer: Self-pay | Admitting: Endocrinology

## 2015-01-07 ENCOUNTER — Ambulatory Visit (INDEPENDENT_AMBULATORY_CARE_PROVIDER_SITE_OTHER): Payer: Medicare Other | Admitting: Endocrinology

## 2015-01-07 VITALS — BP 130/70 | HR 81 | Temp 97.3°F | Ht 63.0 in | Wt 180.0 lb

## 2015-01-07 DIAGNOSIS — E1159 Type 2 diabetes mellitus with other circulatory complications: Secondary | ICD-10-CM

## 2015-01-07 NOTE — Progress Notes (Signed)
Subjective:    Patient ID: [name], female    DOB: 11-21-1931, 82 y.o.   MRN: [id number]  HPI Pt returns for f/u of diabetes mellitus: DM type: Insulin-requiring type 2 Dx'ed: 2952 Complications: CAD Therapy: insulin since 2007 GDM: never DKA: never Severe hypoglycemia: never Pancreatitis: never. Other: therapy limited by pt's request for least expensive insulins; she changed to qd insulin, after poor results with multiple daily injections. Interval history: no cbg record, but states cbg's are mildly low in the afternoon a few times per week.  pt states she feels well in general, except for during the mild hypoglycemia.  Past Medical History  Diagnosis Date  . Hypopotassemia     PMH of  . Anemia     iron defficiency  . Type II or unspecified type diabetes mellitus without mention of complication, uncontrolled     Dr Loanne Drilling  . Muscle pain   . Degenerative joint disease   . Atrial fibrillation or flutter     s/p RFCA 7/08;   s/p DCCV in past;   previously on amiodarone;  amio stopped due to lung toxicity  . CAD (coronary artery disease)     s/p NSTEMI tx with BMS to OM1 3/08;  cath 3/08: pOM 99% tx with PCI, pLAD 20%, ? mod stenosis at the AM  . HLD (hyperlipidemia)   . HTN (hypertension)     essential nos  . Dystrophy, corneal stromal   . Chronic diastolic heart failure     echo 11/11:  EF 55-60%, severe LVH, mod LAE, mild MR, mildly increased PASP  . Allergy     seasonal  . Cataract   . CHF (congestive heart failure)   . Myocardial infarction 2008  . Osteoporosis   . Thyroid disease     hypothyroidism    Past Surgical History  Procedure Laterality Date  . Cholecystectomy    . Knee surgery    . Eye surgery      cataract surgery & corneal implant bilaterally  . A flutter ablation      Dr Lovena Le  . Gravid 2 para 2    . Colonoscopy  6/07    2 polyps Dr. Kinnie Feil in [place]  . Bare metal stent  02/2007    Dr Lovena Le  . Colonoscopy with polypectomy       X  2; [facility]  . Left and right heart catheterization with coronary angiogram N/A 05/07/2014    Procedure: LEFT AND RIGHT HEART CATHETERIZATION WITH CORONARY ANGIOGRAM;  Surgeon: Peter M Martinique, MD;  Location: [facility] CATH LAB;  Service: Cardiovascular;  Laterality: N/A;    History   Social History  . Marital Status: Widowed    Spouse Name: N/A    Number of Children: N/A  . Years of Education: N/A   Occupational History  . Teacher    Social History Main Topics  . Smoking status: Former Smoker -- 0.25 packs/day for 18 years    Types: Cigarettes    Quit date: 12/27/1968  . Smokeless tobacco: Never Used     Comment: smoked [place], up to 1 pp week  . Alcohol Use: No  . Drug Use: No  . Sexual Activity: Not on file   Other Topics Concern  . Not on file   Social History Narrative   Teacher. Widowed. Rarely drinks cafeine.     Current Outpatient Prescriptions on File Prior to Visit  Medication Sig Dispense Refill  . apixaban (ELIQUIS) 5 MG  TABS tablet Take 1 tablet (5 mg total) by mouth 2 (two) times daily. 60 tablet 6  . Calcium-Magnesium-Zinc (CAL-MAG-ZINC PO) Take 1 tablet by mouth daily.     . carvedilol (COREG) 25 MG tablet Take 1 tablet (25 mg total) by mouth 2 (two) times daily with a meal. 360 tablet 1  . Cholecalciferol (VITAMIN D3) 1000 UNITS CAPS Take 1 capsule by mouth daily.     . Coenzyme Q10 (CO Q 10 PO) Take by mouth.    . folic acid (FOLVITE) 161 MCG tablet Take 800 mcg by mouth daily.    . furosemide (LASIX) 40 MG tablet Take 80 mg by mouth daily.     . Insulin NPH, Human,, Isophane, (HUMULIN N KWIKPEN) 100 UNIT/ML Kiwkpen Inject 60 Units into the skin every morning. And pen needles 2/day (Patient taking differently: Inject 50 Units into the skin every morning. And pen needles 2/day) 30 mL 11  . methimazole (TAPAZOLE) 5 MG tablet Take 5 mg by mouth every Monday, Wednesday, and Friday.    . nitroGLYCERIN (NITROSTAT) 0.4 MG SL tablet Place 0.4 mg under the tongue  every 5 (five) minutes as needed for chest pain.    . Omega-3 Fatty Acids (FISH OIL) 1200 MG CAPS Take 1 capsule by mouth daily.     . potassium chloride SA (K-DUR,KLOR-CON) 20 MEQ tablet Take 2 tabs every other day alternating with 1 tab every other day    . rosuvastatin (CRESTOR) 20 MG tablet Take 1 tablet (20 mg total) by mouth daily. 30 tablet 0  . tiotropium (SPIRIVA) 18 MCG inhalation capsule Place 1 capsule (18 mcg total) into inhaler and inhale daily. 90 capsule 1  . vitamin B-12 (CYANOCOBALAMIN) 1000 MCG tablet Take 1,000 mcg by mouth daily.       No current facility-administered medications on file prior to visit.    Allergies  Allergen Reactions  . Amlodipine Besylate     REACTION: tingling in lips & gum edema  . Lobster [Shellfish Allergy]     angioedema  . Penicillins     REACTION: rash  . Valsartan     REACTION: angioedema  . Codeine     Mental status changes  . Tramadol Hcl     REACTION: nausea    Family History  Problem Relation Age of Onset  . Diabetes Mother   . Hypertension Mother   . Transient ischemic attack Mother   . Heart attack Father 55  . Arthritis Father   . Breast cancer Maternal Aunt   . Arthritis Maternal Aunt   . Arthritis Mother   . Hypertension Father     BP 130/70 mmHg  Pulse 81  Temp(Src) 97.3 F (36.3 C) (Oral)  Ht 5\' 3"  (1.6 m)  Wt 180 lb (81.647 kg)  BMI 31.89 kg/m2  SpO2 92%  Review of Systems Denies LOC, but she has weight gain.     Objective:   Physical Exam VITAL SIGNS:  See vs page GENERAL: no distress Pulses: dorsalis pedis intact bilat.   MSK: no deformity of the feet CV: no leg edema Skin:  no ulcer on the feet.  normal color and temp on the feet. Neuro: sensation is intact to touch on the feet     Assessment & Plan:  DM: uncertain control: Side-effect of rx: mild hypoglycemia.    Patient is advised the following: Patient Instructions  check your blood sugar twice a day.  vary the time of day when you  check, between  before the 3 meals, and at bedtime.  also check if you have symptoms of your blood sugar being too high or too low.  please keep a record of the readings and bring it to your next appointment here.  please call us sooner if your blood sugar goes below 70, or if you have a lot of readings over 200.   Please reduce the NPH to 50 units each morning, and none in the evening.   On this type of insulin schedule, you should eat meals on a regular schedule.  If a meal is missed or significantly delayed, your blood sugar could go low.  Please come back for a follow-up appointment in 1 month.

## 2015-01-07 NOTE — Patient Instructions (Signed)
check your blood sugar twice a day.  vary the time of day when you check, between before the 3 meals, and at bedtime.  also check if you have symptoms of your blood sugar being too high or too low.  please keep a record of the readings and bring it to your next appointment here.  please call us sooner if your blood sugar goes below 70, or if you have a lot of readings over 200.   Please reduce the NPH to 50 units each morning, and none in the evening.   On this type of insulin schedule, you should eat meals on a regular schedule.  If a meal is missed or significantly delayed, your blood sugar could go low.  Please come back for a follow-up appointment in 1 month.

## 2015-01-13 ENCOUNTER — Telehealth: Payer: Self-pay

## 2015-01-13 MED ORDER — ROSUVASTATIN CALCIUM 20 MG PO TABS: 20.0000 mg | ORAL_TABLET | Freq: Every day | ORAL | Status: DC

## 2015-01-13 MED ORDER — TIOTROPIUM BROMIDE MONOHYDRATE 18 MCG IN CAPS: 18.0000 ug | ORAL_CAPSULE | Freq: Every day | RESPIRATORY_TRACT | Status: DC

## 2015-01-13 NOTE — Addendum Note (Signed)
Addended by: Roma Schanz R on: 01/13/2015 11:26 AM   Modules accepted: Orders

## 2015-01-13 NOTE — Telephone Encounter (Signed)
-----   Message from Hendricks Limes, MD sent at 01/12/2015  2:52 PM EST ----- Please see form for free meds Needs Rxs printed for both & 1 month of Crestor locally

## 2015-01-13 NOTE — Telephone Encounter (Addendum)
Patient advised she can pick up her form and scripts at the front desk. 1 month supply of Crestor has been sent to [facility] on [address]

## 2015-01-13 NOTE — Telephone Encounter (Signed)
What form

## 2015-01-21 ENCOUNTER — Other Ambulatory Visit: Payer: Self-pay | Admitting: Endocrinology

## 2015-01-21 NOTE — Telephone Encounter (Signed)
Please advise if ok to refill. Rx is listed under historical provider.  Thanks!

## 2015-01-23 ENCOUNTER — Telehealth: Payer: Self-pay | Admitting: Internal Medicine

## 2015-01-23 NOTE — Telephone Encounter (Deleted)
Error

## 2015-01-24 ENCOUNTER — Encounter: Payer: Medicare Other | Attending: Internal Medicine | Admitting: *Deleted

## 2015-01-24 ENCOUNTER — Telehealth: Payer: Self-pay

## 2015-01-24 VITALS — Ht 63.0 in | Wt 180.8 lb

## 2015-01-24 DIAGNOSIS — E119 Type 2 diabetes mellitus without complications: Secondary | ICD-10-CM

## 2015-01-24 DIAGNOSIS — Z79899 Other long term (current) drug therapy: Secondary | ICD-10-CM | POA: Insufficient documentation

## 2015-01-24 DIAGNOSIS — Z713 Dietary counseling and surveillance: Secondary | ICD-10-CM | POA: Insufficient documentation

## 2015-01-24 DIAGNOSIS — Z794 Long term (current) use of insulin: Secondary | ICD-10-CM | POA: Insufficient documentation

## 2015-01-24 MED ORDER — INSULIN LISPRO PROT & LISPRO (75-25 MIX) 100 UNIT/ML KWIKPEN: 60.0000 [IU] | PEN_INJECTOR | SUBCUTANEOUS | Status: DC

## 2015-01-24 NOTE — Telephone Encounter (Signed)
Ok, please change to humalog 75/25, 60 units qam (with breakfast). i'll see you next time.

## 2015-01-24 NOTE — Telephone Encounter (Signed)
Pt called to report blood sugar readings. Fasting blood sugar readings have been 67, 78, and 70. In the evening before bed sugar will spike and be 201, 203, and 205. Pt confirmed she is taking 50 units of Humulin in the morning. W Please advise, Thanks!

## 2015-01-24 NOTE — Progress Notes (Signed)
  Medical Nutrition Therapy:  Appt start time: 1130 end time:  1200.  Assessment:  Primary concerns today: 01/24/15. This is a follow up visit for her diabetes. She is here with her granddaughter who drives her to appointments and appears supportive. She states she has had some extra responsibilities with family members that have needed her help so has not been to the gym, which she attributes to her weight gain this visit. She is complaining of frequent hypoglycemia. She reported to her MD and her insulin has been reduced, but she is still having hypoglycemia and not longer is having symptoms to warn her. She plans to follow up with her MD again. She expresses good understanding of Carb Counting and has healthy eating habits. She is looking forward to getting back to the gym soon.   Preferred Learning Style:   No preference indicated   Learning Readiness:   Ready  Change in progress  MEDICATIONS: see list, diabetes medications are NPH insulin. The Regular insulin has been discontinued due to her hypoglycemia   DIETARY INTAKE:  24-hr recall:  B (9 AM): fresh fruit, egg, whole grain toast, 1/2 piece bacon, OR oatmeal pancakes, hot tea (hibiscus)  Snk ( AM):no L (2 PM): Thai soup, fresh fruit, sunflower seeds, OR 1/2 sandwich with extra lettuce or spinach, mayo, fresh fruit and water and milk Snk ( PM): sunflower seeds, fresh fruit D (7:30 PM): lean meat, small serving of starch, vegetables and salad with olive oil, 1/2 Weight [employer]chers popsicle Snk ( PM): not usually, unless 1/2 slice bread with PNB, honey and 4 oz milk OR sunflower seeds Beverages: milk, water,   Usual physical activity: none since the holidays to due ill family member that needed her help.   Estimated energy needs: 1200 calories 135 g carbohydrates 90 g protein 33 g fat  Progress Towards Goal(s):  In progress.   Nutritional Diagnosis:  NB-1.1 Food and nutrition-related knowledge deficit As related to diabetes.   As evidenced by A1c of 8.7%.    Intervention: We reviewed her insulin action and discussed options for getting exercise when not able to get to the gym. I provided a Log Sheet as another tool for recording BG as well as Carb intake and insulin doses. Discussed prevention of hypoglycemia by consuming at least one carb choice per meal and more if going to be active. Encouraged her to notify her MD of the low BGs too.   Plan:  Continue to aim for 2-3 Carb Choices per meal (30-45 grams) +/- 1 either way  Continue to aim for 0-1 Carbs per snack if hungry  Include protein in moderation with your meals and snacks Continue reading food labels for Total Carbohydrate of foods Resume your activity level as tolerated Continue checking BG at alternate times per day and record on the Log Sheet I provided you or your Log Book  Due to the low BG your are having each week without any symptoms anymore, notify your MD so he can adjust your insulin doses as needed.     Teaching Method Utilized: Visual, Auditory and Hands on  Handouts given during visit include: She has the DM [facility] flyer BG Log Sheet  Barriers to learning/adherence to lifestyle change: none  Demonstrated degree of understanding via:  Teach Back   Monitoring/Evaluation:  Dietary intake, exercise, reading food labels, insulin administration, and body weight in 3 months.

## 2015-01-24 NOTE — Telephone Encounter (Signed)
Pt advised of note below and voiced understanding.  

## 2015-01-24 NOTE — Patient Instructions (Addendum)
Plan:  Continue to aim for 2-3 Carb Choices per meal (30-45 grams) +/- 1 either way  Continue to aim for 0-1 Carbs per snack if hungry  Include protein in moderation with your meals and snacks Continue reading food labels for Total Carbohydrate of foods Resume your activity level as tolerated Continue checking BG at alternate times per day and record on the Log Sheet I provided you or your Log Book  Due to the low BG your are having each week without any symptoms anymore, notify your MD so he can adjust your insulin doses as needed.

## 2015-01-27 ENCOUNTER — Telehealth: Payer: Self-pay | Admitting: Endocrinology

## 2015-01-27 MED ORDER — GLUCOSE BLOOD VI STRP: ORAL_STRIP | Status: DC

## 2015-01-27 NOTE — Telephone Encounter (Signed)
Patient need prescription for test script for the free style meter.

## 2015-01-27 NOTE — Telephone Encounter (Signed)
Rx sent to pharmacy   

## 2015-02-04 ENCOUNTER — Other Ambulatory Visit: Payer: Self-pay

## 2015-02-04 MED ORDER — GLUCOSE BLOOD VI STRP: ORAL_STRIP | Status: DC

## 2015-02-06 ENCOUNTER — Encounter: Payer: Self-pay | Admitting: Endocrinology

## 2015-02-06 ENCOUNTER — Ambulatory Visit (INDEPENDENT_AMBULATORY_CARE_PROVIDER_SITE_OTHER): Payer: Medicare Other | Admitting: Endocrinology

## 2015-02-06 VITALS — BP 144/70 | HR 85 | Temp 97.9°F | Ht 63.0 in | Wt 184.0 lb

## 2015-02-06 DIAGNOSIS — E119 Type 2 diabetes mellitus without complications: Secondary | ICD-10-CM

## 2015-02-06 DIAGNOSIS — E0590 Thyrotoxicosis, unspecified without thyrotoxic crisis or storm: Secondary | ICD-10-CM

## 2015-02-06 LAB — MICROALBUMIN / CREATININE URINE RATIO
Creatinine,U: 43.9 mg/dL
Microalb Creat Ratio: 25 mg/g (ref 0.0–30.0)
Microalb, Ur: 11 mg/dL — ABNORMAL HIGH (ref 0.0–1.9)

## 2015-02-06 LAB — HEMOGLOBIN A1C: HEMOGLOBIN A1C: 8.6 % — AB (ref 4.6–6.5)

## 2015-02-06 LAB — TSH: TSH: 3.11 u[IU]/mL (ref 0.35–4.50)

## 2015-02-06 NOTE — Patient Instructions (Addendum)
check your blood sugar twice a day.  vary the time of day when you check, between before the 3 meals, and at bedtime.  also check if you have symptoms of your blood sugar being too high or too low.  please keep a record of the readings and bring it to your next appointment here.  please call us sooner if your blood sugar goes below 70, or if you have a lot of readings over 200.   blood tests are being requested for you today.  We'll let you know about the results.  On this type of insulin schedule, you should eat meals on a regular schedule.  If a meal is missed or significantly delayed, your blood sugar could go low.  Please come back for a follow-up appointment in 3 months.

## 2015-02-06 NOTE — Progress Notes (Signed)
Subjective:    Patient ID: [name], female    DOB: Jul 30, 1931, 81 y.o.   MRN: [id number]  HPI Pt returns for f/u of diabetes mellitus: DM type: Insulin-requiring type 2 Dx'ed: 3875 Complications: CAD Therapy: insulin since 2007. GDM: never DKA: never Severe hypoglycemia: never Pancreatitis: never. Other: therapy limited by pt's request for least expensive insulins; she changed to qd insulin, after poor results with multiple daily injections. Interval history: She increased the insulin to 60 units qam, a few weeks ago.  she brings a record of her cbg's which i have reviewed today.  It varies from 50-200.  It is highest at hs, and lowest in the afternoon.   Past Medical History  Diagnosis Date  . Hypopotassemia     PMH of  . Anemia     iron defficiency  . Type II or unspecified type diabetes mellitus without mention of complication, uncontrolled     Dr Loanne Drilling  . Muscle pain   . Degenerative joint disease   . Atrial fibrillation or flutter     s/p RFCA 7/08;   s/p DCCV in past;   previously on amiodarone;  amio stopped due to lung toxicity  . CAD (coronary artery disease)     s/p NSTEMI tx with BMS to OM1 3/08;  cath 3/08: pOM 99% tx with PCI, pLAD 20%, ? mod stenosis at the AM  . HLD (hyperlipidemia)   . HTN (hypertension)     essential nos  . Dystrophy, corneal stromal   . Chronic diastolic heart failure     echo 11/11:  EF 55-60%, severe LVH, mod LAE, mild MR, mildly increased PASP  . Allergy     seasonal  . Cataract   . CHF (congestive heart failure)   . Myocardial infarction 2008  . Osteoporosis   . Thyroid disease     hypothyroidism    Past Surgical History  Procedure Laterality Date  . Cholecystectomy    . Knee surgery    . Eye surgery      cataract surgery & corneal implant bilaterally  . A flutter ablation      Dr Lovena Le  . Gravid 2 para 2    . Colonoscopy  6/07    2 polyps Dr. Kinnie Feil in [place]  . Bare metal stent  02/2007    Dr Lovena Le  .  Colonoscopy with polypectomy       X 2; [facility]  . Left and right heart catheterization with coronary angiogram N/A 05/07/2014    Procedure: LEFT AND RIGHT HEART CATHETERIZATION WITH CORONARY ANGIOGRAM;  Surgeon: Peter M Martinique, MD;  Location: [facility] CATH LAB;  Service: Cardiovascular;  Laterality: N/A;    History   Social History  . Marital Status: Widowed    Spouse Name: N/A  . Number of Children: N/A  . Years of Education: N/A   Occupational History  . Teacher    Social History Main Topics  . Smoking status: Former Smoker -- 0.25 packs/day for 18 years    Types: Cigarettes    Quit date: 12/27/1968  . Smokeless tobacco: Never Used     Comment: smoked [place], up to 1 pp week  . Alcohol Use: No  . Drug Use: No  . Sexual Activity: Not on file   Other Topics Concern  . Not on file   Social History Narrative   Teacher. Widowed. Rarely drinks cafeine.     Current Outpatient Prescriptions on File Prior to Visit  Medication Sig Dispense Refill  . apixaban (ELIQUIS) 5 MG TABS tablet Take 1 tablet (5 mg total) by mouth 2 (two) times daily. 60 tablet 6  . Calcium-Magnesium-Zinc (CAL-MAG-ZINC PO) Take 1 tablet by mouth daily.     . carvedilol (COREG) 25 MG tablet Take 1 tablet (25 mg total) by mouth 2 (two) times daily with a meal. 360 tablet 1  . Cholecalciferol (VITAMIN D3) 1000 UNITS CAPS Take 1 capsule by mouth daily.     . Coenzyme Q10 (CO Q 10 PO) Take by mouth.    . folic acid (FOLVITE) 937 MCG tablet Take 800 mcg by mouth daily.    . furosemide (LASIX) 40 MG tablet Take 80 mg by mouth daily.     Marland Kitchen glucose blood (FREESTYLE LITE) test strip Use to check blood sugar 2 time per day. 200 each 2  . Insulin Lispro Prot & Lispro (HUMALOG MIX 75/25 KWIKPEN) (75-25) 100 UNIT/ML Kwikpen Inject 60 Units into the skin every morning. And pen needles 2/day (Patient taking differently: Inject 65 Units into the skin every morning. And pen needles 2/day) 30 mL 11  . methimazole (TAPAZOLE)  5 MG tablet Take 5 mg by mouth every Monday, Wednesday, and Friday.    . nitroGLYCERIN (NITROSTAT) 0.4 MG SL tablet Place 0.4 mg under the tongue every 5 (five) minutes as needed for chest pain.    . Omega-3 Fatty Acids (FISH OIL) 1200 MG CAPS Take 1 capsule by mouth daily.     . potassium chloride SA (K-DUR,KLOR-CON) 20 MEQ tablet Take 2 tabs every other day alternating with 1 tab every other day    . rosuvastatin (CRESTOR) 20 MG tablet Take 1 tablet (20 mg total) by mouth daily. 90 tablet 1  . tiotropium (SPIRIVA) 18 MCG inhalation capsule Place 1 capsule (18 mcg total) into inhaler and inhale daily. 90 capsule 3  . vitamin B-12 (CYANOCOBALAMIN) 1000 MCG tablet Take 1,000 mcg by mouth daily.       No current facility-administered medications on file prior to visit.    Allergies  Allergen Reactions  . Amlodipine Besylate     REACTION: tingling in lips & gum edema  . Lobster [Shellfish Allergy]     angioedema  . Penicillins     REACTION: rash  . Valsartan     REACTION: angioedema  . Codeine     Mental status changes  . Tramadol Hcl     REACTION: nausea    Family History  Problem Relation Age of Onset  . Diabetes Mother   . Hypertension Mother   . Transient ischemic attack Mother   . Heart attack Father 68  . Arthritis Father   . Breast cancer Maternal Aunt   . Arthritis Maternal Aunt   . Arthritis Mother   . Hypertension Father     BP 144/70 mmHg  Pulse 85  Temp(Src) 97.9 F (36.6 C) (Oral)  Ht 5\' 3"  (1.6 m)  Wt 184 lb (83.462 kg)  BMI 32.60 kg/m2  SpO2 95%   Review of Systems She has gained a few lbs.  She denies LOC.      Objective:   Physical Exam VITAL SIGNS:  See vs page GENERAL: no distress Pulses: dorsalis pedis intact bilat.   MSK: no deformity of the feet CV: no leg edema Skin:  no ulcer on the feet.  normal color and temp on the feet. Neuro: sensation is intact to touch on the feet.    Lab Results  Component  Value Date   HGBA1C 8.6*  02/06/2015   Lab Results  Component Value Date   TSH 3.11 02/06/2015      Assessment & Plan:  DM: moderate exacerbation.   Side effect of rx mild hypoglycemia.   Hyperthyroidism: well-controlled   Patient is advised the following: Patient Instructions  check your blood sugar twice a day.  vary the time of day when you check, between before the 3 meals, and at bedtime.  also check if you have symptoms of your blood sugar being too high or too low.  please keep a record of the readings and bring it to your next appointment here.  please call us sooner if your blood sugar goes below 70, or if you have a lot of readings over 200.   blood tests are being requested for you today.  We'll let you know about the results.  On this type of insulin schedule, you should eat meals on a regular schedule.  If a meal is missed or significantly delayed, your blood sugar could go low.  Please come back for a follow-up appointment in 3 months.         increase the insulin to 65 units each morning. It is important to prevent the sugar from going low in the afternoon, so carry a light snack in case.

## 2015-02-07 ENCOUNTER — Telehealth: Payer: Self-pay | Admitting: Endocrinology

## 2015-02-07 NOTE — Telephone Encounter (Signed)
Patient is calling for lab results.

## 2015-02-07 NOTE — Telephone Encounter (Signed)
Pt advised of note below and voiced understanding. Requested call back in a couple of days to inform of sugars.

## 2015-02-07 NOTE — Telephone Encounter (Signed)
Pt called and advised of most recent lab work and instructed to increase insulin to 65 units. Pt stated concern for increasing insulin. Today at 145pm blood sugar was 69 and at 4:30 blood sugar was 57. Pt states she went to to exercise class after lunch (yoga and water aerobics). Pt states for breakfast she had fruit and ham and for lunch she had a tuna sandwich, fruit, salad and a diet pepsi. Pt stated the is concerned the increase in her medication will cause even lower blood sugar readings.  Please advise, Thanks!

## 2015-02-07 NOTE — Telephone Encounter (Signed)
Ok, please reduce back to 55 units qam

## 2015-02-11 ENCOUNTER — Ambulatory Visit (HOSPITAL_COMMUNITY)
Admission: RE | Admit: 2015-02-11 | Discharge: 2015-02-11 | Disposition: A | Discharge status: Home / Self Care | Payer: Medicare Other | Source: Ambulatory Visit | Attending: Internal Medicine | Admitting: Internal Medicine

## 2015-02-11 VITALS — BP 142/80 | HR 80 | Wt 183.0 lb

## 2015-02-11 DIAGNOSIS — Z79899 Other long term (current) drug therapy: Secondary | ICD-10-CM | POA: Insufficient documentation

## 2015-02-11 DIAGNOSIS — G4733 Obstructive sleep apnea (adult) (pediatric): Secondary | ICD-10-CM | POA: Insufficient documentation

## 2015-02-11 DIAGNOSIS — Z794 Long term (current) use of insulin: Secondary | ICD-10-CM | POA: Diagnosis not present

## 2015-02-11 DIAGNOSIS — R001 Bradycardia, unspecified: Secondary | ICD-10-CM | POA: Insufficient documentation

## 2015-02-11 DIAGNOSIS — Z7901 Long term (current) use of anticoagulants: Secondary | ICD-10-CM | POA: Insufficient documentation

## 2015-02-11 DIAGNOSIS — R0600 Dyspnea, unspecified: Secondary | ICD-10-CM

## 2015-02-11 DIAGNOSIS — I10 Essential (primary) hypertension: Secondary | ICD-10-CM | POA: Diagnosis not present

## 2015-02-11 DIAGNOSIS — I272 Other secondary pulmonary hypertension: Secondary | ICD-10-CM | POA: Insufficient documentation

## 2015-02-11 DIAGNOSIS — I4892 Unspecified atrial flutter: Secondary | ICD-10-CM | POA: Insufficient documentation

## 2015-02-11 DIAGNOSIS — E663 Overweight: Secondary | ICD-10-CM | POA: Diagnosis not present

## 2015-02-11 DIAGNOSIS — I5032 Chronic diastolic (congestive) heart failure: Secondary | ICD-10-CM | POA: Insufficient documentation

## 2015-02-11 DIAGNOSIS — I482 Chronic atrial fibrillation: Secondary | ICD-10-CM | POA: Insufficient documentation

## 2015-02-11 DIAGNOSIS — I270 Primary pulmonary hypertension: Secondary | ICD-10-CM

## 2015-02-11 NOTE — Progress Notes (Signed)
Patient ID: [name], female   DOB: May 02, 1931, 81 y.o.   MRN: [id number]  Referring Physician: Lovena [name] Primary Care: [name] Primary Cardiologist:[name]  HPI: [name] is a pleasant 81 yo woman with CAD, HTN, atrial fibrillation/ LA flutter and chronic prior systolic heart failure, (which was likely rate related) and diastolic heart failure. She was referred by Dr. Lovena [name] for further evaluation of Pulmonary HTN.   Saw Dr. Lovena [name] earlier this year. Underwent 2D echo in 3/15 which showed EF 60-65% mild LVH. RV was normal. No significant valvular abnormalities.   Underwent R/L cath 05/07/14 Which showed stable CAD and significant PH  RA 6 RV 71/7 6 PA 63/17 (33)  PCWP 19 mm Hg  LV 174/16 mm Hg  AO 171/62 mean 103 mm Hg  PVR = 2.86 Oxygen saturations:  PA 69%  AO 99%  Cardiac Output/Index (Fick) 4.88/2.7    Left mainstem: Normal.  Left anterior descending (LAD): Moderate calcification proximally. Mild irregularities less than 10%. The first diagonal has 40-50% ostial disease.  Left circumflex (LCx): The LCx gives rise to a large OM1 then terminates in the AV groove. There is 20% disease in the proximal LCx. The first OM stent is patent with diffuse 20% disease.  Right coronary artery (RCA): The RCA arises anteriorly. There is an eccentric slit like stenosis in the proximal vessel to 60-70%. The mid vessel has segmental 70-80% disease.  Left ventriculography: Left ventricular systolic function is normal, LVEF is estimated at 55-65%, there is no significant mitral regurgitation   We saw her for an initial visit in May 2105 for pHTN and DOE. At that time she was quite dyspneic with activity. HR was in 40-50 range on carvedilol 50 bid and digoxin. We stopped her digoxin and cut carvedilol back to 25 bid. PFTs, VQ and CXR were ordered. I also asked her to decrease her H2O intake. We saw her back last month and she was feeling much better. Given results of PFTs overnight oximeter was  placed and referred to Dr. Gwenette [name]. She has had overnight oximetry which showed oxygen desaturation as low as 83%, and spent 97 minutes less than 88% during the night.   Results:  05/21/14: VQ normal 5/15: CXR:   Emphysema. Otherwise normal.  5/15: PFTs FEV1  1.4 (95%) FVC    1.89 (110%) FEV/FVC 88% FEF 25-75%  66% DLCO 39%  Follow-up: Follows with Dr. Gwenette [name] for OSA. On CPAP.  Overall feeling better. Dyspnea much improved.  No CP. No edema/orthopnea/PND. Can't walk like she used to due to dyspnea and knee pain. Does go to [name] 3 days per week doing water exercises and doing Nu-step. No bleeding with Eliquis.   Weight unchanged.   Current Outpatient Prescriptions  Medication Sig Dispense Refill  . apixaban (ELIQUIS) 5 MG TABS tablet Take 1 tablet (5 mg total) by mouth 2 (two) times daily. 60 tablet 6  . Calcium-Magnesium-Zinc (CAL-MAG-ZINC PO) Take 1 tablet by mouth daily.     . carvedilol (COREG) 25 MG tablet Take 1 tablet (25 mg total) by mouth 2 (two) times daily with a meal. 360 tablet 1  . Cholecalciferol (VITAMIN D3) 1000 UNITS CAPS Take 1 capsule by mouth daily.     . Coenzyme Q10 (CO Q 10 PO) Take by mouth.    . folic acid (FOLVITE) 016 MCG tablet Take 800 mcg by mouth daily.    . furosemide (LASIX) 40 MG tablet Take 80 mg by mouth daily.     Marland Kitchen  glucose blood (FREESTYLE LITE) test strip Use to check blood sugar 2 time per day. 200 each 2  . Insulin Lispro Prot & Lispro (HUMALOG MIX 75/25 KWIKPEN) (75-25) 100 UNIT/ML Kwikpen Inject 60 Units into the skin every morning. And pen needles 2/day (Patient taking differently: Inject 65 Units into the skin every morning. And pen needles 2/day) 30 mL 11  . methimazole (TAPAZOLE) 5 MG tablet Take 5 mg by mouth every Monday, Wednesday, and Friday.    . nitroGLYCERIN (NITROSTAT) 0.4 MG SL tablet Place 0.4 mg under the tongue every 5 (five) minutes as needed for chest pain.    . Omega-3 Fatty Acids (FISH OIL) 1200 MG CAPS Take 1 capsule by mouth  daily.     . potassium chloride SA (K-DUR,KLOR-CON) 20 MEQ tablet Take 2 tabs every other day alternating with 1 tab every other day    . rosuvastatin (CRESTOR) 20 MG tablet Take 1 tablet (20 mg total) by mouth daily. 90 tablet 1  . tiotropium (SPIRIVA) 18 MCG inhalation capsule Place 1 capsule (18 mcg total) into inhaler and inhale daily. 90 capsule 3  . vitamin B-12 (CYANOCOBALAMIN) 1000 MCG tablet Take 1,000 mcg by mouth daily.       No current facility-administered medications for this encounter.    Allergies  Allergen Reactions  . Amlodipine Besylate     REACTION: tingling in lips & gum edema  . Lobster [Shellfish Allergy]     angioedema  . Penicillins     REACTION: rash  . Valsartan     REACTION: angioedema  . Codeine     Mental status changes  . Tramadol Hcl     REACTION: nausea   PHYSICAL EXAM: Filed Vitals:   02/11/15 1501  BP: 142/80  Pulse: 80  Weight: 183 lb (83.008 kg)  SpO2: 92%    General:  Looks younger than stated age. Well appearing. No respiratory difficulty HEENT: normal Neck: supple. JVP 5. Carotids 2+ bilat; no bruits. No lymphadenopathy or thryomegaly appreciated. Cor: PMI nondisplaced.  Irregular rate & rhythm. No rubs, gallops or murmurs. Lungs: clear with diminished BS throughout. No wheeze Abdomen: soft, nontender, nondistended. No hepatosplenomegaly. No bruits or masses. Good bowel sounds. Extremities: no cyanosis, clubbing, rash, tr edema Neuro: alert & oriented x 3, cranial nerves grossly intact. moves all 4 extremities w/o difficulty. Affect pleasant.   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  Volume status looks good. We have discussed whether her symptoms are related to overly high expectation or if she is  really physiologically limited. Will proceed with CPX testing to quantify functional capacity and help identify if the primary limiting factor is cardiac, pulmonary or other. Will also repeat echo to reassess pulmonary pressures.   Continue eliquis 5 bid for AF.   Koehn Salehi,MD 3:13 PM

## 2015-02-11 NOTE — Addendum Note (Signed)
Encounter addended by: Scarlette Calico, RN on: 02/11/2015  3:31 PM<BR>     Documentation filed: Visit Diagnoses, Dx Association, Patient Instructions Section, Orders

## 2015-02-11 NOTE — Patient Instructions (Signed)
Your physician has recommended that you have a cardiopulmonary stress test (CPX). CPX testing is a non-invasive measurement of heart and lung function. It replaces a traditional treadmill stress test. This type of test provides a tremendous amount of information that relates not only to your present condition but also for future outcomes. This test combines measurements of you ventilation, respiratory gas exchange in the lungs, electrocardiogram (EKG), blood pressure and physical response before, during, and following an exercise protocol.  Your physician has requested that you have an echocardiogram. Echocardiography is a painless test that uses sound waves to create images of your heart. It provides your doctor with information about the size and shape of your heart and how well your heart's chambers and valves are working. This procedure takes approximately one hour. There are no restrictions for this procedure.  Your physician recommends that you schedule a follow-up appointment in: 4 months

## 2015-02-20 ENCOUNTER — Ambulatory Visit (HOSPITAL_COMMUNITY): Payer: Medicare Other | Attending: Internal Medicine

## 2015-02-20 DIAGNOSIS — I5032 Chronic diastolic (congestive) heart failure: Secondary | ICD-10-CM | POA: Diagnosis not present

## 2015-03-04 ENCOUNTER — Ambulatory Visit (HOSPITAL_COMMUNITY)
Admission: RE | Admit: 2015-03-04 | Discharge: 2015-03-04 | Disposition: A | Discharge status: Home / Self Care | Payer: Medicare Other | Source: Ambulatory Visit | Attending: Internal Medicine | Admitting: Internal Medicine

## 2015-03-04 DIAGNOSIS — I10 Essential (primary) hypertension: Secondary | ICD-10-CM | POA: Insufficient documentation

## 2015-03-04 DIAGNOSIS — I340 Nonrheumatic mitral (valve) insufficiency: Secondary | ICD-10-CM | POA: Insufficient documentation

## 2015-03-04 DIAGNOSIS — E785 Hyperlipidemia, unspecified: Secondary | ICD-10-CM | POA: Insufficient documentation

## 2015-03-04 DIAGNOSIS — I4892 Unspecified atrial flutter: Secondary | ICD-10-CM | POA: Diagnosis not present

## 2015-03-04 DIAGNOSIS — I4891 Unspecified atrial fibrillation: Secondary | ICD-10-CM | POA: Diagnosis not present

## 2015-03-04 DIAGNOSIS — I517 Cardiomegaly: Secondary | ICD-10-CM

## 2015-03-04 DIAGNOSIS — Z87891 Personal history of nicotine dependence: Secondary | ICD-10-CM | POA: Diagnosis not present

## 2015-03-04 DIAGNOSIS — I5032 Chronic diastolic (congestive) heart failure: Secondary | ICD-10-CM | POA: Insufficient documentation

## 2015-03-04 DIAGNOSIS — I2510 Atherosclerotic heart disease of native coronary artery without angina pectoris: Secondary | ICD-10-CM | POA: Diagnosis not present

## 2015-03-07 ENCOUNTER — Telehealth (HOSPITAL_COMMUNITY): Payer: Self-pay | Admitting: Cardiology

## 2015-03-07 NOTE — Telephone Encounter (Signed)
Pt would like cpx results reviewed with her before her follow up appointment in May

## 2015-03-25 ENCOUNTER — Telehealth (HOSPITAL_COMMUNITY): Payer: Self-pay | Admitting: Vascular Surgery

## 2015-03-25 NOTE — Telephone Encounter (Signed)
Will speak with provider regarding results

## 2015-03-25 NOTE — Telephone Encounter (Signed)
PT would like results for her CPX and Echo

## 2015-04-03 ENCOUNTER — Telehealth (HOSPITAL_COMMUNITY): Payer: Self-pay

## 2015-04-03 NOTE — Telephone Encounter (Signed)
Patient called c/o worsening shortness of breath with exertion.  States this has been a longstanding issue with her but feels it is getting worse.  No cough, cold, respiratory illness/issue noted.  Patient's weight stable, no swelling/edema, and otherwise feels fine.  Added on to next available appointment next week.  Advised patient to call us back tomorrow if she is even worse as she may need to come in to ED.  Aware and agreeable.

## 2015-04-10 ENCOUNTER — Encounter (HOSPITAL_COMMUNITY): Payer: Self-pay

## 2015-04-10 ENCOUNTER — Telehealth: Payer: Self-pay | Admitting: Internal Medicine

## 2015-04-10 ENCOUNTER — Ambulatory Visit (HOSPITAL_COMMUNITY)
Admission: RE | Admit: 2015-04-10 | Discharge: 2015-04-10 | Disposition: A | Discharge status: Home / Self Care | Payer: Medicare Other | Source: Ambulatory Visit | Attending: Internal Medicine | Admitting: Internal Medicine

## 2015-04-10 VITALS — BP 134/78 | HR 80 | Wt 185.5 lb

## 2015-04-10 DIAGNOSIS — Z7982 Long term (current) use of aspirin: Secondary | ICD-10-CM | POA: Insufficient documentation

## 2015-04-10 DIAGNOSIS — I482 Chronic atrial fibrillation: Secondary | ICD-10-CM | POA: Diagnosis not present

## 2015-04-10 DIAGNOSIS — Z79899 Other long term (current) drug therapy: Secondary | ICD-10-CM | POA: Diagnosis not present

## 2015-04-10 DIAGNOSIS — R001 Bradycardia, unspecified: Secondary | ICD-10-CM | POA: Diagnosis not present

## 2015-04-10 DIAGNOSIS — E669 Obesity, unspecified: Secondary | ICD-10-CM | POA: Diagnosis not present

## 2015-04-10 DIAGNOSIS — I5032 Chronic diastolic (congestive) heart failure: Secondary | ICD-10-CM

## 2015-04-10 DIAGNOSIS — I2510 Atherosclerotic heart disease of native coronary artery without angina pectoris: Secondary | ICD-10-CM | POA: Insufficient documentation

## 2015-04-10 DIAGNOSIS — I10 Essential (primary) hypertension: Secondary | ICD-10-CM | POA: Insufficient documentation

## 2015-04-10 DIAGNOSIS — I272 Other secondary pulmonary hypertension: Secondary | ICD-10-CM | POA: Insufficient documentation

## 2015-04-10 DIAGNOSIS — G4733 Obstructive sleep apnea (adult) (pediatric): Secondary | ICD-10-CM | POA: Diagnosis not present

## 2015-04-10 LAB — BASIC METABOLIC PANEL
ANION GAP: 12 (ref 5–15)
BUN: 29 mg/dL — ABNORMAL HIGH (ref 6–23)
CO2: 28 mmol/L (ref 19–32)
Calcium: 9.8 mg/dL (ref 8.4–10.5)
Chloride: 100 mmol/L (ref 96–112)
Creatinine, Ser: 1.12 mg/dL — ABNORMAL HIGH (ref 0.50–1.10)
GFR, EST AFRICAN AMERICAN: 52 mL/min — AB (ref >90)
GFR, EST NON AFRICAN AMERICAN: 45 mL/min — AB (ref >90)
GLUCOSE: 172 mg/dL — AB (ref 70–99)
POTASSIUM: 4 mmol/L (ref 3.5–5.1)
SODIUM: 140 mmol/L (ref 135–145)

## 2015-04-10 LAB — TSH: TSH: 3.459 u[IU]/mL (ref 0.350–4.500)

## 2015-04-10 LAB — BRAIN NATRIURETIC PEPTIDE: B NATRIURETIC PEPTIDE 5: 165.4 pg/mL — AB (ref 0.0–100.0)

## 2015-04-10 MED ORDER — CARVEDILOL 12.5 MG PO TABS: 12.5000 mg | ORAL_TABLET | Freq: Two times a day (BID) | ORAL | Status: DC

## 2015-04-10 NOTE — Patient Instructions (Signed)
DECREASE Carvedilol to 12.5mg  tablet twice daily.  Follow up 6 weeks.  Do the following things EVERYDAY: 1) Weigh yourself in the morning before breakfast. Write it down and keep it in a log. 2) Take your medicines as prescribed 3) Eat low salt foods-Limit salt (sodium) to 2000 mg per day.  4) Stay as active as you can everyday 5) Limit all fluids for the day to less than 2 liters

## 2015-04-10 NOTE — Progress Notes (Signed)
Patient ID: [name], female   DOB: 02/15/1931, 81 y.o.   MRN: [id number]  Referring Physician: Lovena Le Primary Care: Linna Darner Primary Cardiologist:Taylor  HPI: [name] is a pleasant 81 yo woman with CAD, HTN, atrial fibrillation/ LA\\A flutter and chronic prior systolic heart failure, (which was likely rate related) and diastolic heart failure. She was referred by Dr. Lovena Le for further evaluation of Pulmonary HTN.   Saw Dr. Lovena Le earlier this year. Underwent 2D echo in 3/15 which showed EF 60-65% mild LVH. RV was normal. No significant valvular abnormalities.   Underwent R/L cath 05/07/14 Which showed stable CAD and significant PH  RA 6 RV 71/7 6 PA 63/17 (33)  PCWP 19 mm Hg  LV 174/16 mm Hg  AO 171/62 mean 103 mm Hg  PVR = 2.86 Oxygen saturations:  PA 69%  AO 99%  Cardiac Output/Index (Fick) 4.88/2.7    Left mainstem: Normal.  Left anterior descending (LAD): Moderate calcification proximally. Mild irregularities less than 10%. The first diagonal has 40-50% ostial disease.  Left circumflex (LCx): The LCx gives rise to a large OM1 then terminates in the AV groove. There is 20% disease in the proximal LCx. The first OM stent is patent with diffuse 20% disease.  Right coronary artery (RCA): The RCA arises anteriorly. There is an eccentric slit like stenosis in the proximal vessel to 60-70%. The mid vessel has segmental 70-80% disease.  Left ventriculography: Left ventricular systolic function is normal, LVEF is estimated at 55-65%, there is no significant mitral regurgitation   We saw her for an initial visit in May 2105 for pHTN and DOE. At that time she was quite dyspneic with activity. HR was in 40-50 range on carvedilol 50 bid and digoxin. We stopped her digoxin and cut carvedilol back to 25 bid. PFTs, VQ and CXR were ordered. I also asked her to decrease her H2O intake. We saw her back last month and she was feeling much better. Given results of PFTs overnight oximeter  was placed and referred to Dr. Gwenette Greet. She has had overnight oximetry which showed oxygen desaturation as low as 83%, and spent 97 minutes less than 88% during the night.   Results:  05/21/14: VQ normal 5/15: CXR: 3/16: Echo 3/16 EF 60-65% RV normal. Mild TR No RVSP measured   Emphysema. Otherwise normal.  5/15: PFTs FEV1  1.4 (95%) FVC    1.89 (110%) FEV/FVC 88% FEF 25-75%  66% DLCO 39%  BP rest: 138/58 BP peak: 182/66 Peak VO2: 8.9 (69.1% predicted peak VO2) VE/VCO2 slope: 34.6 OUES: Peak RER: 1.24 Ventilatory Threshold: 5.8 (50% predicted and 65% measured peak VO2) Peak RR 38 Peak Ventilation: 32.7 VE/MVV: 71%  PETCO2 at peak: 36 O2pulse: 8 (100% predicted O2pulse)   Exercise testing with gas exchange demonstrates a mildly reduced functional capacity when matched to sedentary norms. The limitation appears multifactorial in nature. At peak exercise, the primary limitation seems ventilatory and the patient has a mild to moderate obstructive/restrictive pattern on resting PFTs. The patient's blunted HR response to exercise and obesity also seem to be affecting her exercise capacity. Finally, the moderately elevated Ve/VCO2 slope can be reflective of diastolic dysfunction.   Follow-up: Last week was very SOB after eating multiple casseroles, etc. Lasix increased 80 daily to 120 daily for 1 week. . Now breathing improved. Has gained 10 pounds over past 6 months. Having problems with R knee pain. Has seen Dr. Gladstone Lighter who recommended TKA but was worried about risk. Not weighing every day.  Mild edema. No orthopnea/PND. Does go to [facility] 5 days per week doing water exercises. No bleeding with Eliquis.  Overall says feels pretty good "8/10".  Current Outpatient Prescriptions  Medication Sig Dispense Refill  . apixaban (ELIQUIS) 5 MG TABS tablet Take 1 tablet (5 mg total) by mouth 2 (two) times daily. 60 tablet 6  . Calcium-Magnesium-Zinc (CAL-MAG-ZINC PO) Take 1 tablet by mouth  daily.     . carvedilol (COREG) 25 MG tablet Take 1 tablet (25 mg total) by mouth 2 (two) times daily with a meal. 360 tablet 1  . Cholecalciferol (VITAMIN D3) 1000 UNITS CAPS Take 1 capsule by mouth daily.     . Coenzyme Q10 (CO Q 10 PO) Take by mouth.    . folic acid (FOLVITE) 509 MCG tablet Take 800 mcg by mouth daily.    . furosemide (LASIX) 40 MG tablet Take 80 mg by mouth daily.     Marland Kitchen glucose blood (FREESTYLE LITE) test strip Use to check blood sugar 2 time per day. 200 each 2  . Insulin Lispro Prot & Lispro (HUMALOG MIX 75/25 KWIKPEN) (75-25) 100 UNIT/ML Kwikpen Inject 60 Units into the skin every morning. And pen needles 2/day (Patient taking differently: Inject 45 Units into the skin every morning. And pen needles 2/day) 30 mL 11  . Magnesium 250 MG TABS Take 250 mg by mouth daily.    . methimazole (TAPAZOLE) 5 MG tablet Take 5 mg by mouth every Monday, Wednesday, and Friday.    . nitroGLYCERIN (NITROSTAT) 0.4 MG SL tablet Place 0.4 mg under the tongue every 5 (five) minutes as needed for chest pain.    . Omega-3 Fatty Acids (FISH OIL) 1200 MG CAPS Take 1 capsule by mouth daily.     . potassium chloride SA (K-DUR,KLOR-CON) 20 MEQ tablet Take 2 tabs every other day alternating with 1 tab every other day    . rosuvastatin (CRESTOR) 20 MG tablet Take 1 tablet (20 mg total) by mouth daily. 90 tablet 1  . tiotropium (SPIRIVA) 18 MCG inhalation capsule Place 1 capsule (18 mcg total) into inhaler and inhale daily. 90 capsule 3  . vitamin B-12 (CYANOCOBALAMIN) 1000 MCG tablet Take 1,000 mcg by mouth daily.       No current facility-administered medications for this encounter.    Allergies  Allergen Reactions  . Amlodipine Besylate     REACTION: tingling in lips & gum edema  . Lobster [Shellfish Allergy]     angioedema  . Penicillins     REACTION: rash  . Valsartan     REACTION: angioedema  . Codeine     Mental status changes  . Tramadol Hcl     REACTION: nausea   PHYSICAL  EXAM: Filed Vitals:   04/10/15 1200  BP: 134/78  Pulse: 80  Weight: 185 lb 8 oz (84.142 kg)  SpO2: 95%    General:  Looks younger than stated age. Well appearing. No respiratory difficulty HEENT: normal Neck: supple. JVP flat. Carotids 2+ bilat; no bruits. No lymphadenopathy or thryomegaly appreciated. Cor: PMI nondisplaced.  Irregular rate & rhythm. No rubs, gallops or murmurs. Lungs: clear with diminished BS throughout. No wheeze Abdomen: soft, nontender, nondistended. No hepatosplenomegaly. No bruits or masses. Good bowel sounds. Extremities: no cyanosis, clubbing, rash, no edema Neuro: alert & oriented x 3, cranial nerves grossly intact. moves all 4 extremities w/o difficulty. Affect pleasant.   ASSESSMENT & PLAN: 1) Mild pulmonary HTN  2) Chronic diastolic HF, NYHA II 3) Overweight  4) Chronic atrial fib/flutter 5) Bradycardia - resolved 6) OSA 7) HTN  Recently with mild HF flare due to dietary indiscretion. Now back to baseline with extra lasix. Will cut lasix back to 80mg  daily. Reinforced need for daily weights and reviewed use of sliding scale diuretics.  CPX reviewed in detail with patient and her daughter. This shows a mild to moderate function limitation which is multifactorial. Including chronotropic incompetence, obesity, lung disease and probable pulmonary HTN. Will cut carvedilol back to 12.5 bid. She will work on weight loss and continue with sliding scale diuretics. If no improvement can consider repeat RHC and targeted treatment of PH.   Continue eliquis 5 bid for AF.  Check labs today including BMET, CBC, BNP, TSH.  Total time spent 35 minutes. Over half that time spent discussing above.    Daniel Bensimhon,MD 12:14 PM

## 2015-04-10 NOTE — Telephone Encounter (Signed)
Come in ; I want to see her beautiful face [name] is the ear wizard !

## 2015-04-10 NOTE — Telephone Encounter (Signed)
I called patient. She was not available so I spoke to her son and made her an appt tomorrow at 830.

## 2015-04-10 NOTE — Telephone Encounter (Signed)
Patient called about removing was out of her ear. She tried the wax removal kit and it didn't work. Should she try the kit again or should she just come on in.

## 2015-04-11 ENCOUNTER — Encounter: Payer: Self-pay | Admitting: Internal Medicine

## 2015-04-11 ENCOUNTER — Ambulatory Visit (INDEPENDENT_AMBULATORY_CARE_PROVIDER_SITE_OTHER): Payer: Medicare Other | Admitting: Internal Medicine

## 2015-04-11 VITALS — BP 138/82 | HR 78 | Temp 97.7°F | Resp 15 | Ht 63.0 in | Wt 187.0 lb

## 2015-04-11 DIAGNOSIS — M1711 Unilateral primary osteoarthritis, right knee: Secondary | ICD-10-CM

## 2015-04-11 DIAGNOSIS — H6123 Impacted cerumen, bilateral: Secondary | ICD-10-CM | POA: Diagnosis not present

## 2015-04-11 NOTE — Progress Notes (Signed)
Pre visit review using our clinic review tool, if applicable. No additional management support is needed unless otherwise documented below in the visit note. 

## 2015-04-11 NOTE — Progress Notes (Signed)
   Subjective:    Patient ID: [name], female    DOB: [birth year], 81 y.o.   MRN: [id number]  HPI    Review of Systems     Objective:   Physical Exam   Both cerumen impactions removed almost completely. Whisper heard at 6 feet following this intervention.     Assessment & Plan:

## 2015-04-11 NOTE — Patient Instructions (Addendum)
Please do not use Q-tips as we discussed. Should wax build up occur, please put 2-3 drops of mineral oil in the affected  ear at night to soften the wax .Cover the canal with a  cotton ball to prevent the oil from staining bed linens. In the morning fill the ear canal with hydrogen peroxide & lie in the opposite lateral decubitus position(on the side opposite the affected ear)  for 10-15 minutes. After allowing this period of time for the peroxide to dissolve the wax ;shower and use the thinnest washrag available to wick out the wax. If both ears are involved ; alternate this treatment from ear to ear each night until no wax is found on the washrag.

## 2015-04-11 NOTE — Progress Notes (Signed)
   Subjective:    Patient ID: [name], female    DOB: 14-Jul-1931, 81 y.o.   MRN: [id number]  HPI  She is here to discuss cerumen impactions. The nurse practitioner made a home visit a week ago and told her that both ears were full of wax. She used an over-the-counter ear wax removal preparation without benefit. She states her son notes she has had hearing loss.  She has some extrinsic symptoms but no symptoms of upper respiratory tract infection.  She also has severe pain in her knees, particularly the right due to degenerative joint disease. She's questioning clearance for surgery. Her cardiologist apparently has recommended she simply avoid activities that cause pain.  Review of Systems  She denies associated tinnitus. She does note some "echoing" in her ears. She has some rhinitis with clear drainage. She also has itchy, watery eyes and sneezing. Frontal headache, facial pain , nasal purulence, dental pain, sore throat , otic pain or otic discharge denied. No fever , chills or sweats.    Objective:   Physical Exam Pertinent or positive findings include: She has cerumen impactions bilaterally. She has decreased hearing bilaterally, greater on the right than the left. Tuning fork actually lateralizes to the right.  Heart sounds are distant and slightly irregular.  Breath sounds are also decreased.  She has crepitus of both knees, greater on the right than the left   General appearance :adequately nourished; in no distress. Eyes: No conjunctival inflammation or scleral icterus is present. Oral exam:  Lips and gums are healthy appearing.There is no oropharyngeal erythema or exudate noted. Dental hygiene is good. Lungs:Chest clear to auscultation; no wheezes, rhonchi,rales ,or rubs present.No increased work of breathing.  Abdomen: bowel sounds normal, soft and non-tender without masses, organomegaly or hernias noted.  No guarding or rebound.  Vascular : all pulses equal ; DPP  decreased.No bruits present. Skin:Warm & dry.  Intact without suspicious lesions or rashes ; no tenting Lymphatic: No lymphadenopathy is noted about the head, neck, axilla Neuro: Strength, tone  normal.         Assessment & Plan:  #1 cerumen impactions   #2 degenerative joint disease; operative clearance deferred to her cardiologist

## 2015-04-13 ENCOUNTER — Emergency Department (HOSPITAL_COMMUNITY)
Admission: EM | Admit: 2015-04-13 | Discharge: 2015-04-13 | Disposition: A | Discharge status: Home / Self Care | Payer: Medicare Other | Attending: Emergency Medicine | Admitting: Emergency Medicine

## 2015-04-13 ENCOUNTER — Encounter (HOSPITAL_COMMUNITY): Payer: Self-pay | Admitting: Nurse Practitioner

## 2015-04-13 DIAGNOSIS — Z7951 Long term (current) use of inhaled steroids: Secondary | ICD-10-CM | POA: Insufficient documentation

## 2015-04-13 DIAGNOSIS — I252 Old myocardial infarction: Secondary | ICD-10-CM | POA: Insufficient documentation

## 2015-04-13 DIAGNOSIS — E785 Hyperlipidemia, unspecified: Secondary | ICD-10-CM | POA: Insufficient documentation

## 2015-04-13 DIAGNOSIS — E119 Type 2 diabetes mellitus without complications: Secondary | ICD-10-CM | POA: Insufficient documentation

## 2015-04-13 DIAGNOSIS — I5032 Chronic diastolic (congestive) heart failure: Secondary | ICD-10-CM | POA: Diagnosis not present

## 2015-04-13 DIAGNOSIS — Z880 Allergy status to penicillin: Secondary | ICD-10-CM | POA: Insufficient documentation

## 2015-04-13 DIAGNOSIS — I2510 Atherosclerotic heart disease of native coronary artery without angina pectoris: Secondary | ICD-10-CM | POA: Insufficient documentation

## 2015-04-13 DIAGNOSIS — Z87891 Personal history of nicotine dependence: Secondary | ICD-10-CM | POA: Insufficient documentation

## 2015-04-13 DIAGNOSIS — Z9889 Other specified postprocedural states: Secondary | ICD-10-CM | POA: Diagnosis not present

## 2015-04-13 DIAGNOSIS — Z794 Long term (current) use of insulin: Secondary | ICD-10-CM | POA: Diagnosis not present

## 2015-04-13 DIAGNOSIS — M79609 Pain in unspecified limb: Secondary | ICD-10-CM | POA: Diagnosis not present

## 2015-04-13 DIAGNOSIS — M79604 Pain in right leg: Secondary | ICD-10-CM | POA: Insufficient documentation

## 2015-04-13 DIAGNOSIS — Z7901 Long term (current) use of anticoagulants: Secondary | ICD-10-CM | POA: Diagnosis not present

## 2015-04-13 DIAGNOSIS — Z79899 Other long term (current) drug therapy: Secondary | ICD-10-CM | POA: Diagnosis not present

## 2015-04-13 DIAGNOSIS — Z8669 Personal history of other diseases of the nervous system and sense organs: Secondary | ICD-10-CM | POA: Insufficient documentation

## 2015-04-13 DIAGNOSIS — I10 Essential (primary) hypertension: Secondary | ICD-10-CM | POA: Insufficient documentation

## 2015-04-13 DIAGNOSIS — M7989 Other specified soft tissue disorders: Secondary | ICD-10-CM | POA: Diagnosis not present

## 2015-04-13 DIAGNOSIS — M79661 Pain in right lower leg: Secondary | ICD-10-CM

## 2015-04-13 DIAGNOSIS — Z862 Personal history of diseases of the blood and blood-forming organs and certain disorders involving the immune mechanism: Secondary | ICD-10-CM | POA: Diagnosis not present

## 2015-04-13 NOTE — ED Provider Notes (Signed)
CSN: [id number]     Arrival date & time 04/13/15  1113 History   First MD Initiated Contact with Patient 04/13/15 1122     Chief Complaint  Patient presents with  . Leg Pain     (Consider location/radiation/quality/duration/timing/severity/associated sxs/prior Treatment) Patient is a 81 y.o. female presenting with leg pain. The history is provided by the patient.  Leg Pain Location:  Leg Time since incident:  1 day Injury: no   Leg location:  R lower leg Pain details:    Quality:  Aching   Radiates to:  Does not radiate   Severity:  Mild   Onset quality:  Sudden   Duration:  1 day   Timing:  Intermittent   Progression:  Unchanged Chronicity:  New Relieved by:  Rest Worsened by:  Bearing weight Associated symptoms: no fever     Past Medical History  Diagnosis Date  . Hypopotassemia     PMH of  . Anemia     iron defficiency  . Type II or unspecified type diabetes mellitus without mention of complication, uncontrolled     Dr Loanne Drilling  . Muscle pain   . Degenerative joint disease   . Atrial fibrillation or flutter     s/p RFCA 7/08;   s/p DCCV in past;   previously on amiodarone;  amio stopped due to lung toxicity  . CAD (coronary artery disease)     s/p NSTEMI tx with BMS to OM1 3/08;  cath 3/08: pOM 99% tx with PCI, pLAD 20%, ? mod stenosis at the AM  . HLD (hyperlipidemia)   . HTN (hypertension)     essential nos  . Dystrophy, corneal stromal   . Chronic diastolic heart failure     echo 11/11:  EF 55-60%, severe LVH, mod LAE, mild MR, mildly increased PASP  . Allergy     seasonal  . Cataract   . CHF (congestive heart failure)   . Myocardial infarction 2008  . Osteoporosis   . Thyroid disease     hypothyroidism   Past Surgical History  Procedure Laterality Date  . Cholecystectomy    . Knee surgery    . Eye surgery      cataract surgery & corneal implant bilaterally  . A flutter ablation      Dr Lovena Le  . Gravid 2 para 2    . Colonoscopy  6/07    2  polyps Dr. Kinnie Feil in [place]  . Bare metal stent  02/2007    Dr Lovena Le  . Colonoscopy with polypectomy       X 2; [facility]  . Left and right heart catheterization with coronary angiogram N/A 05/07/2014    Procedure: LEFT AND RIGHT HEART CATHETERIZATION WITH CORONARY ANGIOGRAM;  Surgeon: Peter M Martinique, MD;  Location: [facility] CATH LAB;  Service: Cardiovascular;  Laterality: N/A;   Family History  Problem Relation Age of Onset  . Diabetes Mother   . Hypertension Mother   . Transient ischemic attack Mother   . Heart attack Father 9  . Arthritis Father   . Breast cancer Maternal Aunt   . Arthritis Maternal Aunt   . Arthritis Mother   . Hypertension Father    History  Substance Use Topics  . Smoking status: Former Smoker -- 0.25 packs/day for 18 years    Types: Cigarettes    Quit date: 12/27/1968  . Smokeless tobacco: Never Used     Comment: smoked [place], up to 1 pp week  .  Alcohol Use: No   OB History    No data available     Review of Systems  Constitutional: Negative for fever and chills.  Respiratory: Negative for cough and shortness of breath.   All other systems reviewed and are negative.     Allergies  Amlodipine besylate; Lobster; Penicillins; Valsartan; Codeine; and Tramadol hcl  Home Medications   Prior to Admission medications   Medication Sig Start Date End Date Taking? Authorizing Provider  apixaban (ELIQUIS) 5 MG TABS tablet Take 1 tablet (5 mg total) by mouth 2 (two) times daily. 05/29/14   Jolaine Artist, MD  carvedilol (COREG) 12.5 MG tablet Take 1 tablet (12.5 mg total) by mouth 2 (two) times daily with a meal. 04/10/15   Jolaine Artist, MD  furosemide (LASIX) 40 MG tablet Take 80 mg by mouth daily.  05/29/14   Shaune Pascal Bensimhon, MD  glucose blood (FREESTYLE LITE) test strip Use to check blood sugar 2 time per day. 02/04/15   Renato Shin, MD  Insulin Lispro Prot & Lispro (HUMALOG MIX 75/25 KWIKPEN) (75-25) 100 UNIT/ML Kwikpen Inject 60 Units into the  skin every morning. And pen needles 2/day Patient taking differently: Inject 45 Units into the skin every morning. And pen needles 2/day 01/24/15   Renato Shin, MD  methimazole (TAPAZOLE) 5 MG tablet Take 5 mg by mouth every Monday, Wednesday, and Friday.    Historical Provider, MD  Multiple Vitamins-Minerals (CENTRUM SILVER PO) Take by mouth daily.    Historical Provider, MD  nitroGLYCERIN (NITROSTAT) 0.4 MG SL tablet Place 0.4 mg under the tongue every 5 (five) minutes as needed for chest pain. 05/02/14   Evans Lance, MD  potassium chloride SA (K-DUR,KLOR-CON) 20 MEQ tablet Take 2 tabs every other day alternating with 1 tab every other day 03/20/14   Evans Lance, MD  rosuvastatin (CRESTOR) 20 MG tablet Take 1 tablet (20 mg total) by mouth daily. 01/13/15   Hendricks Limes, MD  tiotropium (SPIRIVA) 18 MCG inhalation capsule Place 1 capsule (18 mcg total) into inhaler and inhale daily. 01/13/15   Hendricks Limes, MD   BP 151/48 mmHg  Pulse 59  Temp(Src) 97.6 F (36.4 C) (Oral)  Resp 16  Ht 5\' 3"  (1.6 m)  Wt 180 lb (81.647 kg)  BMI 31.89 kg/m2  SpO2 94% Physical Exam  Constitutional: She is oriented to person, place, and time. She appears well-developed and well-nourished. No distress.  HENT:  Head: Normocephalic and atraumatic.  Mouth/Throat: Oropharynx is clear and moist.  Eyes: EOM are normal. Pupils are equal, round, and reactive to light.  Neck: Normal range of motion. Neck supple.  Cardiovascular: Normal rate and regular rhythm.  Exam reveals no friction rub.   No murmur heard. Pulmonary/Chest: Effort normal and breath sounds normal. No respiratory distress. She has no wheezes. She has no rales.  Abdominal: Soft. She exhibits no distension. There is no tenderness. There is no rebound.  Musculoskeletal: Normal range of motion. She exhibits no edema.  Neurological: She is alert and oriented to person, place, and time. No cranial nerve deficit. She exhibits normal muscle tone.  Coordination normal.  Skin: Skin is warm. No rash noted. She is not diaphoretic.  Nursing note and vitals reviewed.   ED Course  Procedures (including critical care time) Labs Review Labs Reviewed - No data to display  Imaging Review No results found.   EKG Interpretation None     Editor: Iantha Fallen, RVS (Cardiovascular  Sonographer)     Expand All Collapse All   VASCULAR LAB PRELIMINARY PRELIMINARY PRELIMINARY PRELIMINARY  Right lower extremity venous Doppler completed.   Preliminary report: There is no DVT or SVT noted in the right lower extremity.   KANADY, CANDACE, RVT 04/13/2015, 12:55 PM      MDM   Final diagnoses:  Right calf pain    89F here with R leg pain. Noticed it after working in garden yesterday. PCP and cardiologist were concerned about DVT, sent here for further eval. Here a waxing, normal pulses and normal neurovascular status. No swelling. Ultrasound negative for DVT. This is likely musculoskeletal. Started to follow up with PCP. Stable for discharge.    Evelina Bucy, MD 04/14/15 [id number]

## 2015-04-13 NOTE — ED Notes (Signed)
Patient transported to [facility]

## 2015-04-13 NOTE — ED Notes (Addendum)
She woke this morning with a painful lump behind her R calf and noticed new petechiae to bilateral lower legs. She denies any injuries. She did work in her garden yesterday. She reports the lump is painful when she bears weight on the extremity. She recently stopped taking her individual vitamin supplements and started taking centrum multivitamin. She takes eliquis. She denies any history of blood clots but she called her doctor with her symptoms and they wanted her to be checked for a DVT. Denies any cp. States she has chronic SOB due to [place] but it is not worse today

## 2015-04-13 NOTE — Progress Notes (Signed)
VASCULAR LAB PRELIMINARY  PRELIMINARY  PRELIMINARY  PRELIMINARY  Right lower extremity venous Doppler completed.    Preliminary report:  There is no DVT or SVT noted in the right lower extremity.   Snow Peoples, RVT 04/13/2015, 12:55 PM

## 2015-04-13 NOTE — Discharge Instructions (Signed)

## 2015-04-14 ENCOUNTER — Telehealth: Payer: Self-pay | Admitting: Internal Medicine

## 2015-04-14 MED ORDER — TIOTROPIUM BROMIDE MONOHYDRATE 18 MCG IN CAPS: 18.0000 ug | ORAL_CAPSULE | Freq: Every day | RESPIRATORY_TRACT | Status: DC

## 2015-04-14 NOTE — Telephone Encounter (Signed)
Done

## 2015-04-14 NOTE — Telephone Encounter (Signed)
Pt called in said that she was suppose to get a refill on her tiotropium (SPIRIVA) 18 MCG inhalation capsule [[id number]]  .  She checked at pharmacy and they did not have it.     [facility] on [address]

## 2015-04-16 ENCOUNTER — Telehealth: Payer: Self-pay | Admitting: *Deleted

## 2015-04-16 NOTE — Telephone Encounter (Signed)
[facility] Night - Client TELEPHONE ADVICE RECORD [facility] Medical Call Center Patient Name: [name] Gender: Female DOB: 1931-01-29 Age: 81 Y 7 M 1 D Return Phone Number: [phone] (Primary), [phone] (Secondary) Address: [address] City/State/Zip: [place] [address] Client [facility] Night - Client Client Site [place] - Night Physician [place], [place] Type Call Call Type Triage / Clinical Relationship To Patient Self Return Phone Number [phone] (Primary) Chief Complaint Leg Swelling And Edema Initial Comment Caller states, Rt leg , calf is very tight, it hurts to walk on it PreDisposition Call another nurse Nurse Assessment Nurse: Vevelyn Royals, RN, Verdis Frederickson Date/Time ([place] Time): 04/13/2015 9:28:16 AM Confirm and document reason for call. If symptomatic, describe symptoms. ---Caller states, Rt leg , calf is very tight, it hurts to walk on it. No visible knot. Pain started last night. Has the patient traveled out of the country within the last 30 days? ---No Does the patient require triage? ---Yes Related visit to physician within the last 2 weeks? ---No Does the PT have any chronic conditions? (i.e. diabetes, asthma, etc.) ---Yes List chronic conditions. ---diabetes A-fib CHF pulmonary hypertension Guidelines Guideline Title Affirmed Question Affirmed Notes Nurse Date/Time (Eastern Time) Leg Pain Unable to walk Having to use a cane in order not to bear weight. Caller is very concerned about a blood clot. Vevelyn Royals, RN, Verdis Frederickson 04/13/2015 9:32:09 AM Disp. Time Eilene Ghazi Time) Disposition Final User 04/13/2015 9:35:50 AM Go to ED Now Yes Vevelyn Royals, RN, Ramond Craver Understands: Yes Disagree/Comply: Comply Care Advice Given Per Guideline PLEASE NOTE: All timestamps contained within this report are represented as [place] Standard Time. CONFIDENTIALTY NOTICE: This fax transmission is intended  only for the addressee. It contains information that is legally privileged, confidential or otherwise protected from use or disclosure. If you are not the intended recipient, you are strictly prohibited from reviewing, disclosing, copying using or disseminating any of this information or taking any action in reliance on or regarding this information. If you have received this fax in error, please notify us immediately by telephone so that we can arrange for its return to [place]. Phone: [phone], Toll-Free: [phone], Fax: [phone] Page: 2 of 2 Call Id: [id number] Care Advice Given Per Guideline GO TO ED NOW: You need to be seen in the Emergency Department. Go to the ER at ___________ [facility] now. Drive carefully. AMBULANCE TRANSPORT (NOTE TO TRIAGER): If the patient cannot walk at all because of significant pain or leg weakness, then the patient will need to be transported via ambulance and examined at the emergency department. The patient or family members can arrange ambulance transport via private ambulance company or via EMS 911. BRING MEDICINES: * Please bring a list of your current medicines when you go to the Emergency Department (ER). * It is also a good idea to bring the pill bottles too. This will help the doctor to make certain you are taking the right medicines and the right dose. CARE ADVICE given per Leg Pain (Adult) guideline. After Care Instructions Given Call Event Type User Date / Time Description

## 2015-04-21 ENCOUNTER — Telehealth: Payer: Self-pay

## 2015-04-21 NOTE — Telephone Encounter (Signed)
Patient called to report blood sugar readings.  04/17/2015:  AM: 59  PM: 109  04/18/2015: AM: 59 PM:129  04/19/2015: AM: 81 PM: 119  04/20/2015:  AM: 66  PM 223  Pt stated she started weight watchers over a week ago and since starting this her blood sugars have been lower. Pt confirmed she taking 50 units of Humulin N in the morning.  Please advise, Thanks!

## 2015-04-21 NOTE — Telephone Encounter (Signed)
Left voice mail advising patient to call back and schedule office visit.  

## 2015-04-21 NOTE — Telephone Encounter (Signed)
please call patient: Please move up next appt to next available

## 2015-04-22 ENCOUNTER — Telehealth: Payer: Self-pay | Admitting: Endocrinology

## 2015-04-22 NOTE — Telephone Encounter (Signed)
Patient advised Dr. Loanne Drilling would like to see her in the office to discuss recent blood sugar readings. Pt coming in for a appointment on may 2nd.

## 2015-04-22 NOTE — Telephone Encounter (Signed)
Patient is returning your call, please, call today thank you.

## 2015-04-24 ENCOUNTER — Ambulatory Visit: Payer: Medicare Other | Admitting: *Deleted

## 2015-04-24 ENCOUNTER — Ambulatory Visit (INDEPENDENT_AMBULATORY_CARE_PROVIDER_SITE_OTHER): Payer: Medicare Other | Admitting: Pulmonary Disease

## 2015-04-24 ENCOUNTER — Encounter: Payer: Self-pay | Admitting: Pulmonary Disease

## 2015-04-24 VITALS — BP 136/70 | HR 82 | Temp 97.6°F | Ht 63.0 in | Wt 185.4 lb

## 2015-04-24 DIAGNOSIS — G4733 Obstructive sleep apnea (adult) (pediatric): Secondary | ICD-10-CM | POA: Diagnosis not present

## 2015-04-24 NOTE — Patient Instructions (Signed)
Continue on cpap, and keep up with mask changes and supplies Work on weight loss followup again in one year.

## 2015-04-24 NOTE — Assessment & Plan Note (Signed)
The patient is doing very well on her C Pap, and her download shows excellent compliance and good control of her AHI. She feels that she is sleeping well with the device, and is having no issues with her mask fit or pressure. I have asked her to continue on her device, and to work on weight loss. I will see her back in one year.

## 2015-04-24 NOTE — Progress Notes (Signed)
   Subjective:    Patient ID: [name], female    DOB: [birth year], 81 y.o.   MRN: [id number]  HPI The patient comes in today for follow-up of her obstructive sleep apnea. She is wearing C Pap compliantly by her download, and has excellent control of her AHI.  She feels that her sleep is much improved, and is satisfied with her daytime alertness. Of note, her weight is up 10 pounds from the last visit.   Review of Systems  Constitutional: Negative for fever and unexpected weight change.  HENT: Negative for congestion, dental problem, ear pain, nosebleeds, postnasal drip, rhinorrhea, sinus pressure, sneezing, sore throat and trouble swallowing.   Eyes: Negative for redness and itching.  Respiratory: Negative for cough, chest tightness, shortness of breath and wheezing.   Cardiovascular: Negative for palpitations and leg swelling.  Gastrointestinal: Negative for nausea and vomiting.  Genitourinary: Negative for dysuria.  Musculoskeletal: Negative for joint swelling.  Skin: Negative for rash.  Neurological: Negative for headaches.  Hematological: Does not bruise/bleed easily.  Psychiatric/Behavioral: Negative for dysphoric mood. The patient is not nervous/anxious.        Objective:   Physical Exam Overweight female in no acute distress Nose without purulence or discharge noted No skin breakdown or pressure necrosis from the C Pap mask Neck without lymphadenopathy or thyromegaly Lower extremities without edema, no cyanosis Alert and oriented, moves all 4 extremities.       Assessment & Plan:

## 2015-04-28 ENCOUNTER — Ambulatory Visit (INDEPENDENT_AMBULATORY_CARE_PROVIDER_SITE_OTHER): Payer: Medicare Other | Admitting: Endocrinology

## 2015-04-28 VITALS — BP 130/78 | HR 80 | Temp 97.9°F | Ht 63.0 in | Wt 186.0 lb

## 2015-04-28 DIAGNOSIS — E119 Type 2 diabetes mellitus without complications: Secondary | ICD-10-CM | POA: Diagnosis not present

## 2015-04-28 DIAGNOSIS — E0590 Thyrotoxicosis, unspecified without thyrotoxic crisis or storm: Secondary | ICD-10-CM

## 2015-04-28 LAB — T4, FREE: FREE T4: 0.67 ng/dL (ref 0.60–1.60)

## 2015-04-28 LAB — TSH: TSH: 4.04 u[IU]/mL (ref 0.35–4.50)

## 2015-04-28 MED ORDER — INSULIN LISPRO PROT & LISPRO (75-25 MIX) 100 UNIT/ML KWIKPEN: 35.0000 [IU] | PEN_INJECTOR | SUBCUTANEOUS | Status: DC

## 2015-04-28 NOTE — Progress Notes (Signed)
Subjective:    Patient ID: [name], female    DOB: 18-Sep-1931, 81 y.o.   MRN: [id number]  HPI Pt returns for f/u of diabetes mellitus: DM type: Insulin-requiring type 2 Dx'ed: 1027 Complications: CAD Therapy: insulin since 2007. GDM: never DKA: never Severe hypoglycemia: never Pancreatitis: never. Other: she changed to qd insulin, after poor results with multiple daily injections. Interval history: Pt says she had to reduce insulin to 45 units qam, due to fasting hypoglycemia.  She feels this is due to a renewed dietary effort.  she brings a record of her cbg's which i have reviewed today.  It varies from 70-200, despite the reduction.   Past Medical History  Diagnosis Date  . Hypopotassemia     PMH of  . Anemia     iron defficiency  . Type II or unspecified type diabetes mellitus without mention of complication, uncontrolled     Dr Loanne Drilling  . Muscle pain   . Degenerative joint disease   . Atrial fibrillation or flutter     s/p RFCA 7/08;   s/p DCCV in past;   previously on amiodarone;  amio stopped due to lung toxicity  . CAD (coronary artery disease)     s/p NSTEMI tx with BMS to OM1 3/08;  cath 3/08: pOM 99% tx with PCI, pLAD 20%, ? mod stenosis at the AM  . HLD (hyperlipidemia)   . HTN (hypertension)     essential nos  . Dystrophy, corneal stromal   . Chronic diastolic heart failure     echo 11/11:  EF 55-60%, severe LVH, mod LAE, mild MR, mildly increased PASP  . Allergy     seasonal  . Cataract   . CHF (congestive heart failure)   . Myocardial infarction 2008  . Osteoporosis   . Thyroid disease     hypothyroidism    Past Surgical History  Procedure Laterality Date  . Cholecystectomy    . Knee surgery    . Eye surgery      cataract surgery & corneal implant bilaterally  . A flutter ablation      Dr Lovena Le  . Gravid 2 para 2    . Colonoscopy  6/07    2 polyps Dr. Kinnie Feil in [place]  . Bare metal stent  02/2007    Dr Lovena Le  . Colonoscopy with  polypectomy       X 2; [facility]  . Left and right heart catheterization with coronary angiogram N/A 05/07/2014    Procedure: LEFT AND RIGHT HEART CATHETERIZATION WITH CORONARY ANGIOGRAM;  Surgeon: Peter M Martinique, MD;  Location[facility] CATH LAB;  Service: Cardiovascular;  Laterality: N/A;    History   Social History  . Marital Status: Widowed    Spouse Name: N/A  . Number of Children: N/A  . Years of Education: N/A   Occupational History  . Teacher    Social History Main Topics  . Smoking status: Former Smoker -- 0.25 packs/day for 18 years    Types: Cigarettes    Quit date: 12/27/1968  . Smokeless tobacco: Never Used     Comment: smoked [place], up to 1 pp week  . Alcohol Use: No  . Drug Use: No  . Sexual Activity: Not on file   Other Topics Concern  . Not on file   Social History Narrative   Teacher. Widowed. Rarely drinks cafeine.     Current Outpatient Prescriptions on File Prior to Visit  Medication Sig Dispense  Refill  . apixaban (ELIQUIS) 5 MG TABS tablet Take 1 tablet (5 mg total) by mouth 2 (two) times daily. 60 tablet 6  . carvedilol (COREG) 12.5 MG tablet Take 1 tablet (12.5 mg total) by mouth 2 (two) times daily with a meal. 60 tablet 3  . furosemide (LASIX) 40 MG tablet Take 80 mg by mouth daily.     Marland Kitchen glucose blood (FREESTYLE LITE) test strip Use to check blood sugar 2 time per day. 200 each 2  . methimazole (TAPAZOLE) 5 MG tablet 1 tab, twice a week    . Multiple Vitamins-Minerals (CENTRUM SILVER PO) Take by mouth daily.    . nitroGLYCERIN (NITROSTAT) 0.4 MG SL tablet Place 0.4 mg under the tongue every 5 (five) minutes as needed for chest pain.    . potassium chloride SA (K-DUR,KLOR-CON) 20 MEQ tablet Take 2 tabs every other day alternating with 1 tab every other day    . rosuvastatin (CRESTOR) 20 MG tablet Take 1 tablet (20 mg total) by mouth daily. 90 tablet 1  . tiotropium (SPIRIVA) 18 MCG inhalation capsule Place 1 capsule (18 mcg total) into inhaler  and inhale daily. 90 capsule 3   No current facility-administered medications on file prior to visit.    Allergies  Allergen Reactions  . Amlodipine Besylate     REACTION: tingling in lips & gum edema  . Lobster [Shellfish Allergy]     angioedema  . Penicillins     REACTION: rash  . Valsartan     REACTION: angioedema  . Codeine     Mental status changes  . Tramadol Hcl     REACTION: nausea    Family History  Problem Relation Age of Onset  . Diabetes Mother   . Hypertension Mother   . Transient ischemic attack Mother   . Heart attack Father 29  . Arthritis Father   . Breast cancer Maternal Aunt   . Arthritis Maternal Aunt   . Arthritis Mother   . Hypertension Father     BP 130/78 mmHg  Pulse 80  Temp(Src) 97.9 F (36.6 C) (Oral)  Ht 5\' 3"  (1.6 m)  Wt 186 lb (84.369 kg)  BMI 32.96 kg/m2  SpO2 94%  Review of Systems Denies LOC.  She has lost a few lbs.    Objective:   Physical Exam VITAL SIGNS:  See vs page. GENERAL: no distress. Pulses: dorsalis pedis intact bilat.   MSK: no deformity of the feet. CV: no leg edema.  Skin:  no ulcer on the feet.  normal color and temp on the feet. Neuro: sensation is intact to touch on the feet.     Lab Results  Component Value Date   HGBA1C 8.6* 02/06/2015   Lab Results  Component Value Date   TSH 4.04 04/28/2015      Assessment & Plan:  Hyperthyroidism: well-controlled DM: apparently overcontrolled Weight loss, new, due to her efforts.   Patient is advised the following: Patient Instructions  check your blood sugar twice a day.  vary the time of day when you check, between before the 3 meals, and at bedtime.  also check if you have symptoms of your blood sugar being too high or too low.  please keep a record of the readings and bring it to your next appointment here.  please call us sooner if your blood sugar goes below 70, or if you have a lot of readings over 200.   A diabetes blood test is requested for  you  today.  We'll let you know about the results. On this type of insulin schedule, you should eat meals on a regular schedule.  If a meal is missed or significantly delayed, your blood sugar could go low.  Please reduce the insulin to 35 units each morning. Please come back for a follow-up appointment in 1 month.     Please continue your new diet, as this helps your blood sugar.    addendum: decrease tapazole to 5 mg bid

## 2015-04-28 NOTE — Patient Instructions (Addendum)
check your blood sugar twice a day.  vary the time of day when you check, between before the 3 meals, and at bedtime.  also check if you have symptoms of your blood sugar being too high or too low.  please keep a record of the readings and bring it to your next appointment here.  please call us sooner if your blood sugar goes below 70, or if you have a lot of readings over 200.   A diabetes blood test is requested for you today.  We'll let you know about the results. On this type of insulin schedule, you should eat meals on a regular schedule.  If a meal is missed or significantly delayed, your blood sugar could go low.  Please reduce the insulin to 35 units each morning. Please come back for a follow-up appointment in 1 month.     Please continue your new diet, as this helps your blood sugar.

## 2015-04-30 ENCOUNTER — Encounter: Payer: Self-pay | Admitting: Endocrinology

## 2015-05-03 ENCOUNTER — Other Ambulatory Visit: Payer: Self-pay | Admitting: Internal Medicine

## 2015-05-05 ENCOUNTER — Other Ambulatory Visit (HOSPITAL_COMMUNITY): Payer: Self-pay | Admitting: *Deleted

## 2015-05-05 MED ORDER — POTASSIUM CHLORIDE CRYS ER 20 MEQ PO TBCR: EXTENDED_RELEASE_TABLET | ORAL | Status: DC

## 2015-05-07 ENCOUNTER — Ambulatory Visit: Payer: Medicare Other | Admitting: Endocrinology

## 2015-05-13 ENCOUNTER — Other Ambulatory Visit: Payer: Self-pay

## 2015-05-13 MED ORDER — "PEN NEEDLES 5/16"" 31G X 8 MM MISC": Status: DC

## 2015-05-19 ENCOUNTER — Telehealth (HOSPITAL_COMMUNITY): Payer: Self-pay | Admitting: *Deleted

## 2015-05-19 NOTE — Telephone Encounter (Signed)
Pt called concerned about a rash on the front of her legs, she describes it as looking like "broken vessels" just on her shins.  She states if appeared about 2 weeks ago and was very red but is getting better now, not as red and smaller area.  She denies injury, pain or itching.  She states no new meds have been added, but is concerned since she is on Eliquis, which she has been on for a while.  She would like Dr Bensimhon's input into this, she reports it is getting better and has only been on her shins no where else.  Will send to Dr Haroldine Laws for review

## 2015-05-20 NOTE — Telephone Encounter (Signed)
i do not think it is related to Eliquis. If recurs, please see PCP or dermatologist

## 2015-05-21 NOTE — Telephone Encounter (Signed)
Pt called and asked to speak with heather *see phone note* . Nira Conn is out of the office today so I gave her Dr. Clayborne Dana response.  Pt said she would follow up with her PCP

## 2015-05-23 ENCOUNTER — Other Ambulatory Visit (INDEPENDENT_AMBULATORY_CARE_PROVIDER_SITE_OTHER): Payer: Medicare Other

## 2015-05-23 ENCOUNTER — Ambulatory Visit (INDEPENDENT_AMBULATORY_CARE_PROVIDER_SITE_OTHER): Payer: Medicare Other | Admitting: Internal Medicine

## 2015-05-23 ENCOUNTER — Ambulatory Visit (INDEPENDENT_AMBULATORY_CARE_PROVIDER_SITE_OTHER)
Admission: RE | Admit: 2015-05-23 | Discharge: 2015-05-23 | Disposition: A | Discharge status: Home / Self Care | Payer: Medicare Other | Source: Ambulatory Visit | Attending: Internal Medicine | Admitting: Internal Medicine

## 2015-05-23 ENCOUNTER — Encounter: Payer: Self-pay | Admitting: Internal Medicine

## 2015-05-23 VITALS — BP 118/62 | HR 92 | Temp 98.6°F | Resp 17

## 2015-05-23 DIAGNOSIS — R0609 Other forms of dyspnea: Secondary | ICD-10-CM | POA: Diagnosis not present

## 2015-05-23 DIAGNOSIS — M791 Myalgia: Secondary | ICD-10-CM | POA: Diagnosis not present

## 2015-05-23 DIAGNOSIS — M609 Myositis, unspecified: Secondary | ICD-10-CM

## 2015-05-23 DIAGNOSIS — R5383 Other fatigue: Secondary | ICD-10-CM

## 2015-05-23 DIAGNOSIS — IMO0001 Reserved for inherently not codable concepts without codable children: Secondary | ICD-10-CM

## 2015-05-23 DIAGNOSIS — R195 Other fecal abnormalities: Secondary | ICD-10-CM

## 2015-05-23 LAB — CBC WITH DIFFERENTIAL/PLATELET
BASOS ABS: 0 10*3/uL (ref 0.0–0.1)
Basophils Relative: 0 % (ref 0.0–3.0)
Eosinophils Absolute: 0 10*3/uL (ref 0.0–0.7)
Eosinophils Relative: 0 % (ref 0.0–5.0)
HCT: 42 % (ref 36.0–46.0)
HEMOGLOBIN: 13.7 g/dL (ref 12.0–15.0)
Lymphs Abs: 0.4 10*3/uL — ABNORMAL LOW (ref 0.7–4.0)
MCHC: 32.6 g/dL (ref 30.0–36.0)
MCV: 88.7 fl (ref 78.0–100.0)
Monocytes Absolute: 0.6 10*3/uL (ref 0.1–1.0)
Monocytes Relative: 4.4 % (ref 3.0–12.0)
Neutro Abs: 13.4 10*3/uL — ABNORMAL HIGH (ref 1.4–7.7)
Neutrophils Relative %: 92.8 % — ABNORMAL HIGH (ref 43.0–77.0)
PLATELETS: 136 10*3/uL — AB (ref 150.0–400.0)
RBC: 4.73 Mil/uL (ref 3.87–5.11)
RDW: 17.1 % — ABNORMAL HIGH (ref 11.5–15.5)
WBC: 14.4 10*3/uL — AB (ref 4.0–10.5)

## 2015-05-23 LAB — BASIC METABOLIC PANEL
BUN: 20 mg/dL (ref 6–23)
CHLORIDE: 94 meq/L — AB (ref 96–112)
CO2: 28 mEq/L (ref 19–32)
CREATININE: 1.2 mg/dL (ref 0.40–1.20)
Calcium: 9.1 mg/dL (ref 8.4–10.5)
GFR: 55.38 mL/min — ABNORMAL LOW (ref >60.00)
GLUCOSE: 494 mg/dL — AB (ref 70–99)
POTASSIUM: 4.4 meq/L (ref 3.5–5.1)
SODIUM: 130 meq/L — AB (ref 135–145)

## 2015-05-23 LAB — CK: Total CK: 779 U/L — ABNORMAL HIGH (ref 7–177)

## 2015-05-23 NOTE — Patient Instructions (Signed)
  Your next office appointment will be determined based upon review of your pending labs  and  xrays  Those written interpretation of the lab results and instructions will be transmitted to you by My Chart   Critical results will be called.  Followup as needed for any active or acute issue. Please report any significant change in your symptoms.  STOP Crestor until told to restart it.

## 2015-05-23 NOTE — Progress Notes (Signed)
   Subjective:    Patient ID: [name], female    DOB: [birth year], 81 y.o.   MRN: [id number]  HPI Her symptoms began 05/21/15 as generalized muscle soreness in the morning. [facility] of the generalize soreness. She also had a shortness of breath with exertion. Associated was intermittent nausea. The muscle soreness progressed to the point that she was unable to walk.  She has had dark stool but not frank melena.  2 weeks ago she had a rash over the shins which has resolved. She states that the Nurse thought that it was pityriasis.  Unassociated is numbness from the fifth left finger to the left elbow.  She is on Eliquis and Crestor & has been compliant with these medicines.  She denies other cardiovascular symptoms, bleeding dyscrasia or GI symptoms.  She is on CPAP.   Review of Systems  Chest pain, palpitations, tachycardia,  paroxysmal nocturnal dyspnea, claudication or edema are absent.  Epistaxis, hemoptysis, hematuria, melena, or rectal bleeding denied.  No unexplained weight loss, significant dyspepsia,dysphagia, or abdominal pain.   There is no abnormal bruising , bleeding, or difficulty stopping bleeding with injury.       Objective:   Physical Exam  Pertinent or positive findings include:  She appears profoundly lethargic;she is in a wheelchair  She has a rapid irregular, irregular rhythm. Breath sounds are decreased except for dry rales at the bases. She has DIP osteoarthritic changes.  Pedal pulses are decreased.  General appearance :adequately nourished; in no acute distress. Eyes: No conjunctival inflammation or scleral icterus is present. Oral exam:  Lips and gums are healthy appearing.There is no oropharyngeal erythema or exudate noted. Dental hygiene is good. Lungs:No increased work of breathing.  Abdomen: bowel sounds normal, soft and non-tender without masses, organomegaly or hernias noted.  No guarding or  rebound.  Vascular : all pulses equal ; no bruits present. Skin:Warm & dry.  Intact without suspicious lesions or rashes ; no tenting  Lymphatic: No lymphadenopathy is noted about the head, neck, axilla Neuro: Strength, tone decreased        Assessment & Plan:  #1 diffuse myalgias in the context of statin therapy  #2 exertional dyspnea  #3 fatigue  Plan: Statin will be held. See orders

## 2015-05-24 ENCOUNTER — Other Ambulatory Visit (HOSPITAL_COMMUNITY): Payer: Self-pay

## 2015-05-24 ENCOUNTER — Encounter (HOSPITAL_COMMUNITY): Payer: Self-pay | Admitting: *Deleted

## 2015-05-24 ENCOUNTER — Inpatient Hospital Stay (HOSPITAL_COMMUNITY)
Admission: EM | Admit: 2015-05-24 | Discharge: 2015-05-31 | DRG: 871 | Disposition: A | Discharge status: Skilled Nursing Facility | Payer: Medicare Other | Attending: Internal Medicine | Admitting: Internal Medicine

## 2015-05-24 ENCOUNTER — Emergency Department (HOSPITAL_COMMUNITY): Payer: Medicare Other

## 2015-05-24 DIAGNOSIS — Z955 Presence of coronary angioplasty implant and graft: Secondary | ICD-10-CM | POA: Diagnosis not present

## 2015-05-24 DIAGNOSIS — E860 Dehydration: Secondary | ICD-10-CM | POA: Diagnosis present

## 2015-05-24 DIAGNOSIS — I482 Chronic atrial fibrillation: Secondary | ICD-10-CM | POA: Diagnosis present

## 2015-05-24 DIAGNOSIS — E782 Mixed hyperlipidemia: Secondary | ICD-10-CM | POA: Diagnosis present

## 2015-05-24 DIAGNOSIS — N183 Chronic kidney disease, stage 3 unspecified: Secondary | ICD-10-CM | POA: Insufficient documentation

## 2015-05-24 DIAGNOSIS — G9340 Encephalopathy, unspecified: Secondary | ICD-10-CM | POA: Diagnosis not present

## 2015-05-24 DIAGNOSIS — N179 Acute kidney failure, unspecified: Secondary | ICD-10-CM | POA: Insufficient documentation

## 2015-05-24 DIAGNOSIS — I272 Other secondary pulmonary hypertension: Secondary | ICD-10-CM | POA: Diagnosis present

## 2015-05-24 DIAGNOSIS — R5381 Other malaise: Secondary | ICD-10-CM | POA: Diagnosis not present

## 2015-05-24 DIAGNOSIS — E0590 Thyrotoxicosis, unspecified without thyrotoxic crisis or storm: Secondary | ICD-10-CM | POA: Diagnosis present

## 2015-05-24 DIAGNOSIS — B370 Candidal stomatitis: Secondary | ICD-10-CM | POA: Insufficient documentation

## 2015-05-24 DIAGNOSIS — I129 Hypertensive chronic kidney disease with stage 1 through stage 4 chronic kidney disease, or unspecified chronic kidney disease: Secondary | ICD-10-CM | POA: Diagnosis present

## 2015-05-24 DIAGNOSIS — G4733 Obstructive sleep apnea (adult) (pediatric): Secondary | ICD-10-CM | POA: Diagnosis present

## 2015-05-24 DIAGNOSIS — J9601 Acute respiratory failure with hypoxia: Secondary | ICD-10-CM | POA: Diagnosis not present

## 2015-05-24 DIAGNOSIS — R0600 Dyspnea, unspecified: Secondary | ICD-10-CM | POA: Diagnosis not present

## 2015-05-24 DIAGNOSIS — E1165 Type 2 diabetes mellitus with hyperglycemia: Secondary | ICD-10-CM | POA: Diagnosis present

## 2015-05-24 DIAGNOSIS — N390 Urinary tract infection, site not specified: Secondary | ICD-10-CM

## 2015-05-24 DIAGNOSIS — R739 Hyperglycemia, unspecified: Secondary | ICD-10-CM

## 2015-05-24 DIAGNOSIS — I2510 Atherosclerotic heart disease of native coronary artery without angina pectoris: Secondary | ICD-10-CM | POA: Diagnosis present

## 2015-05-24 DIAGNOSIS — Z7901 Long term (current) use of anticoagulants: Secondary | ICD-10-CM

## 2015-05-24 DIAGNOSIS — J690 Pneumonitis due to inhalation of food and vomit: Secondary | ICD-10-CM | POA: Diagnosis present

## 2015-05-24 DIAGNOSIS — J189 Pneumonia, unspecified organism: Secondary | ICD-10-CM | POA: Diagnosis present

## 2015-05-24 DIAGNOSIS — Z9989 Dependence on other enabling machines and devices: Secondary | ICD-10-CM

## 2015-05-24 DIAGNOSIS — Y92230 Patient room in hospital as the place of occurrence of the external cause: Secondary | ICD-10-CM | POA: Diagnosis not present

## 2015-05-24 DIAGNOSIS — I5043 Acute on chronic combined systolic (congestive) and diastolic (congestive) heart failure: Secondary | ICD-10-CM | POA: Insufficient documentation

## 2015-05-24 DIAGNOSIS — B9620 Unspecified Escherichia coli [E. coli] as the cause of diseases classified elsewhere: Secondary | ICD-10-CM | POA: Diagnosis present

## 2015-05-24 DIAGNOSIS — Z79899 Other long term (current) drug therapy: Secondary | ICD-10-CM | POA: Diagnosis not present

## 2015-05-24 DIAGNOSIS — Z794 Long term (current) use of insulin: Secondary | ICD-10-CM

## 2015-05-24 DIAGNOSIS — I4892 Unspecified atrial flutter: Secondary | ICD-10-CM | POA: Diagnosis present

## 2015-05-24 DIAGNOSIS — E1159 Type 2 diabetes mellitus with other circulatory complications: Secondary | ICD-10-CM | POA: Diagnosis present

## 2015-05-24 DIAGNOSIS — T783XXA Angioneurotic edema, initial encounter: Secondary | ICD-10-CM | POA: Diagnosis not present

## 2015-05-24 DIAGNOSIS — I252 Old myocardial infarction: Secondary | ICD-10-CM

## 2015-05-24 DIAGNOSIS — IMO0002 Reserved for concepts with insufficient information to code with codable children: Secondary | ICD-10-CM

## 2015-05-24 DIAGNOSIS — E785 Hyperlipidemia, unspecified: Secondary | ICD-10-CM | POA: Diagnosis present

## 2015-05-24 DIAGNOSIS — A419 Sepsis, unspecified organism: Principal | ICD-10-CM | POA: Diagnosis present

## 2015-05-24 DIAGNOSIS — Z87891 Personal history of nicotine dependence: Secondary | ICD-10-CM

## 2015-05-24 DIAGNOSIS — R8290 Unspecified abnormal findings in urine: Secondary | ICD-10-CM | POA: Diagnosis present

## 2015-05-24 DIAGNOSIS — I509 Heart failure, unspecified: Secondary | ICD-10-CM

## 2015-05-24 DIAGNOSIS — I5033 Acute on chronic diastolic (congestive) heart failure: Secondary | ICD-10-CM | POA: Insufficient documentation

## 2015-05-24 DIAGNOSIS — I10 Essential (primary) hypertension: Secondary | ICD-10-CM | POA: Diagnosis present

## 2015-05-24 DIAGNOSIS — I5032 Chronic diastolic (congestive) heart failure: Secondary | ICD-10-CM | POA: Diagnosis present

## 2015-05-24 DIAGNOSIS — T378X5A Adverse effect of other specified systemic anti-infectives and antiparasitics, initial encounter: Secondary | ICD-10-CM | POA: Diagnosis not present

## 2015-05-24 DIAGNOSIS — D509 Iron deficiency anemia, unspecified: Secondary | ICD-10-CM | POA: Diagnosis present

## 2015-05-24 DIAGNOSIS — R410 Disorientation, unspecified: Secondary | ICD-10-CM

## 2015-05-24 DIAGNOSIS — B3781 Candidal esophagitis: Secondary | ICD-10-CM | POA: Diagnosis not present

## 2015-05-24 DIAGNOSIS — R7989 Other specified abnormal findings of blood chemistry: Secondary | ICD-10-CM | POA: Diagnosis not present

## 2015-05-24 DIAGNOSIS — R0902 Hypoxemia: Secondary | ICD-10-CM

## 2015-05-24 DIAGNOSIS — E1169 Type 2 diabetes mellitus with other specified complication: Secondary | ICD-10-CM | POA: Diagnosis present

## 2015-05-24 DIAGNOSIS — J9621 Acute and chronic respiratory failure with hypoxia: Secondary | ICD-10-CM | POA: Diagnosis present

## 2015-05-24 HISTORY — DX: Dependence on other enabling machines and devices: Z99.89

## 2015-05-24 HISTORY — DX: Chronic obstructive pulmonary disease, unspecified: J44.9

## 2015-05-24 HISTORY — DX: Angina pectoris, unspecified: I20.9

## 2015-05-24 HISTORY — DX: Cardiac murmur, unspecified: R01.1

## 2015-05-24 HISTORY — DX: Type 2 diabetes mellitus without complications: E11.9

## 2015-05-24 HISTORY — DX: Unspecified osteoarthritis, unspecified site: M19.90

## 2015-05-24 HISTORY — DX: Unspecified asthma, uncomplicated: J45.909

## 2015-05-24 HISTORY — DX: Pneumonia, unspecified organism: J18.9

## 2015-05-24 HISTORY — DX: Obstructive sleep apnea (adult) (pediatric): G47.33

## 2015-05-24 HISTORY — DX: Other seasonal allergic rhinitis: J30.2

## 2015-05-24 LAB — CBG MONITORING, ED
GLUCOSE-CAPILLARY: 367 mg/dL — AB (ref 65–99)
GLUCOSE-CAPILLARY: 383 mg/dL — AB (ref 65–99)
Glucose-Capillary: 444 mg/dL — ABNORMAL HIGH (ref 65–99)
Glucose-Capillary: 458 mg/dL — ABNORMAL HIGH (ref 65–99)
Glucose-Capillary: 432 mg/dL — ABNORMAL HIGH (ref 65–99)

## 2015-05-24 LAB — BASIC METABOLIC PANEL
ANION GAP: 13 (ref 5–15)
BUN: 23 mg/dL — AB (ref 6–20)
CALCIUM: 9 mg/dL (ref 8.9–10.3)
CHLORIDE: 92 mmol/L — AB (ref 101–111)
CO2: 26 mmol/L (ref 22–32)
CREATININE: 1.5 mg/dL — AB (ref 0.44–1.00)
GFR calc Af Amer: 36 mL/min — ABNORMAL LOW (ref >60)
GFR calc non Af Amer: 31 mL/min — ABNORMAL LOW (ref >60)
GLUCOSE: 482 mg/dL — AB (ref 65–99)
POTASSIUM: 4.4 mmol/L (ref 3.5–5.1)
Sodium: 131 mmol/L — ABNORMAL LOW (ref 135–145)

## 2015-05-24 LAB — URINE MICROSCOPIC-ADD ON

## 2015-05-24 LAB — URINALYSIS, ROUTINE W REFLEX MICROSCOPIC
Bilirubin Urine: NEGATIVE
Glucose, UA: >1000 mg/dL — AB
KETONES UR: NEGATIVE mg/dL
NITRITE: NEGATIVE
PROTEIN: 100 mg/dL — AB
Specific Gravity, Urine: 1.02 (ref 1.005–1.030)
Urobilinogen, UA: 1 mg/dL (ref 0.0–1.0)
pH: 5 (ref 5.0–8.0)

## 2015-05-24 LAB — CBC WITH DIFFERENTIAL/PLATELET
BASOS ABS: 0 10*3/uL (ref 0.0–0.1)
Basophils Relative: 0 % (ref 0–1)
Eosinophils Absolute: 0 10*3/uL (ref 0.0–0.7)
Eosinophils Relative: 0 % (ref 0–5)
HEMATOCRIT: 41.8 % (ref 36.0–46.0)
Hemoglobin: 13.6 g/dL (ref 12.0–15.0)
LYMPHS ABS: 0.4 10*3/uL — AB (ref 0.7–4.0)
LYMPHS PCT: 3 % — AB (ref 12–46)
MCH: 29 pg (ref 26.0–34.0)
MCHC: 32.5 g/dL (ref 30.0–36.0)
MCV: 89.1 fL (ref 78.0–100.0)
MONO ABS: 0.6 10*3/uL (ref 0.1–1.0)
Monocytes Relative: 5 % (ref 3–12)
NEUTROS ABS: 12 10*3/uL — AB (ref 1.7–7.7)
Neutrophils Relative %: 92 % — ABNORMAL HIGH (ref 43–77)
Platelets: 136 10*3/uL — ABNORMAL LOW (ref 150–400)
RBC: 4.69 MIL/uL (ref 3.87–5.11)
RDW: 16.2 % — AB (ref 11.5–15.5)
WBC: 13 10*3/uL — ABNORMAL HIGH (ref 4.0–10.5)

## 2015-05-24 LAB — INFLUENZA PANEL BY PCR (TYPE A & B)
H1N1 flu by pcr: NOT DETECTED
Influenza A By PCR: NEGATIVE
Influenza B By PCR: NEGATIVE

## 2015-05-24 LAB — BRAIN NATRIURETIC PEPTIDE: B Natriuretic Peptide: 400.8 pg/mL — ABNORMAL HIGH (ref 0.0–100.0)

## 2015-05-24 LAB — LACTIC ACID, PLASMA: Lactic Acid, Venous: 3.1 mmol/L (ref 0.5–2.0)

## 2015-05-24 LAB — I-STAT TROPONIN, ED: TROPONIN I, POC: 0.04 ng/mL (ref 0.00–0.08)

## 2015-05-24 LAB — GLUCOSE, CAPILLARY: Glucose-Capillary: 397 mg/dL — ABNORMAL HIGH (ref 65–99)

## 2015-05-24 MED ORDER — ROSUVASTATIN CALCIUM 10 MG PO TABS
20.0000 mg | ORAL_TABLET | Freq: Every day | ORAL | Status: DC
  Administered 2015-05-25: 20 mg via ORAL
  Filled 2015-05-24 (×2): qty 2

## 2015-05-24 MED ORDER — SODIUM CHLORIDE 0.9 % IV BOLUS (SEPSIS)
500.0000 mL | Freq: Once | INTRAVENOUS | Status: AC
  Administered 2015-05-24: 500 mL via INTRAVENOUS

## 2015-05-24 MED ORDER — IPRATROPIUM-ALBUTEROL 0.5-2.5 (3) MG/3ML IN SOLN
3.0000 mL | RESPIRATORY_TRACT | Status: DC
  Administered 2015-05-24 – 2015-05-25 (×6): 3 mL via RESPIRATORY_TRACT
  Filled 2015-05-24 (×4): qty 3

## 2015-05-24 MED ORDER — LEVOFLOXACIN IN D5W 750 MG/150ML IV SOLN
750.0000 mg | INTRAVENOUS | Status: DC
  Administered 2015-05-24: 750 mg via INTRAVENOUS
  Filled 2015-05-24: qty 150

## 2015-05-24 MED ORDER — INSULIN ASPART 100 UNIT/ML ~~LOC~~ SOLN
0.0000 [IU] | Freq: Three times a day (TID) | SUBCUTANEOUS | Status: DC
  Administered 2015-05-24 – 2015-05-25 (×2): 15 [IU] via SUBCUTANEOUS
  Administered 2015-05-25: 11 [IU] via SUBCUTANEOUS
  Administered 2015-05-25: 8 [IU] via SUBCUTANEOUS
  Administered 2015-05-26: 11 [IU] via SUBCUTANEOUS
  Administered 2015-05-26: 15 [IU] via SUBCUTANEOUS
  Administered 2015-05-26: 11 [IU] via SUBCUTANEOUS
  Administered 2015-05-27: 8 [IU] via SUBCUTANEOUS
  Administered 2015-05-27: 11 [IU] via SUBCUTANEOUS
  Administered 2015-05-27: 5 [IU] via SUBCUTANEOUS

## 2015-05-24 MED ORDER — INSULIN ASPART 100 UNIT/ML ~~LOC~~ SOLN
5.0000 [IU] | Freq: Once | SUBCUTANEOUS | Status: AC
  Administered 2015-05-24: 5 [IU] via INTRAVENOUS
  Filled 2015-05-24: qty 1

## 2015-05-24 MED ORDER — INSULIN ASPART 100 UNIT/ML ~~LOC~~ SOLN
0.0000 [IU] | Freq: Every day | SUBCUTANEOUS | Status: DC
  Administered 2015-05-24: 4 [IU] via SUBCUTANEOUS
  Administered 2015-05-25: 3 [IU] via SUBCUTANEOUS
  Administered 2015-05-27: 4 [IU] via SUBCUTANEOUS

## 2015-05-24 MED ORDER — CARVEDILOL 25 MG PO TABS
50.0000 mg | ORAL_TABLET | Freq: Two times a day (BID) | ORAL | Status: DC
Start: 2015-05-24 — End: 2015-05-24

## 2015-05-24 MED ORDER — CARVEDILOL 12.5 MG PO TABS
12.5000 mg | ORAL_TABLET | Freq: Two times a day (BID) | ORAL | Status: DC
  Administered 2015-05-24 – 2015-05-25 (×2): 12.5 mg via ORAL
  Filled 2015-05-24 (×2): qty 1

## 2015-05-24 MED ORDER — INSULIN ASPART PROT & ASPART (70-30 MIX) 100 UNIT/ML ~~LOC~~ SUSP
30.0000 [IU] | Freq: Every day | SUBCUTANEOUS | Status: DC
  Filled 2015-05-24: qty 10

## 2015-05-24 MED ORDER — METHIMAZOLE 5 MG PO TABS
5.0000 mg | ORAL_TABLET | ORAL | Status: DC
  Administered 2015-05-26 – 2015-05-30 (×3): 5 mg via ORAL
  Filled 2015-05-24 (×4): qty 1

## 2015-05-24 MED ORDER — IPRATROPIUM-ALBUTEROL 0.5-2.5 (3) MG/3ML IN SOLN: 3.0000 mL | Freq: Once | RESPIRATORY_TRACT | Status: DC

## 2015-05-24 MED ORDER — INSULIN ASPART 100 UNIT/ML ~~LOC~~ SOLN
10.0000 [IU] | Freq: Once | SUBCUTANEOUS | Status: AC
  Administered 2015-05-24: 10 [IU] via INTRAVENOUS
  Filled 2015-05-24: qty 1

## 2015-05-24 MED ORDER — ACETAMINOPHEN 325 MG PO TABS
650.0000 mg | ORAL_TABLET | ORAL | Status: DC | PRN
  Administered 2015-05-25 – 2015-05-26 (×2): 650 mg via ORAL
  Filled 2015-05-24 (×2): qty 2

## 2015-05-24 MED ORDER — APIXABAN 5 MG PO TABS
5.0000 mg | ORAL_TABLET | Freq: Two times a day (BID) | ORAL | Status: DC
  Administered 2015-05-25 (×2): 5 mg via ORAL
  Filled 2015-05-24 (×3): qty 1

## 2015-05-24 MED ORDER — SODIUM CHLORIDE 0.9 % IV SOLN
INTRAVENOUS | Status: DC
  Administered 2015-05-24: 75 mL/h via INTRAVENOUS

## 2015-05-24 MED ORDER — FUROSEMIDE 10 MG/ML IJ SOLN
40.0000 mg | Freq: Once | INTRAMUSCULAR | Status: AC
  Administered 2015-05-24: 40 mg via INTRAVENOUS
  Filled 2015-05-24: qty 4

## 2015-05-24 MED ORDER — NITROGLYCERIN 0.4 MG SL SUBL: 0.4000 mg | SUBLINGUAL_TABLET | SUBLINGUAL | Status: DC | PRN

## 2015-05-24 MED ORDER — GUAIFENESIN 100 MG/5ML PO SYRP
200.0000 mg | ORAL_SOLUTION | ORAL | Status: DC | PRN
  Administered 2015-05-25 – 2015-05-26 (×2): 200 mg via ORAL
  Filled 2015-05-24 (×3): qty 10

## 2015-05-24 NOTE — ED Notes (Signed)
Pt remains monitored by blood pressure, pulse ox, and 5 lead. Pts family remains at bedside.  

## 2015-05-24 NOTE — Progress Notes (Addendum)
ANTIBIOTIC CONSULT NOTE - INITIAL  Pharmacy Consult for Levaquin Indication: pneumonia  Allergies  Allergen Reactions  . Amlodipine Besylate     REACTION: tingling in lips & gum edema  . Lobster [Shellfish Allergy]     angioedema  . Penicillins     REACTION: rash  . Valsartan     REACTION: angioedema  . Codeine     Mental status changes  . Tramadol Hcl     REACTION: nausea    Patient Measurements: Weight 84.4 kg on 04/28/15 Height 160 cm on 04/28/15 Vital Signs: Temp: 99.2 F (37.3 C) (05/28 1427) Temp Source: Oral (05/28 1427) BP: 143/50 mmHg (05/28 1359) Pulse Rate: 94 (05/28 1359) Labs:  Recent Labs  05/23/15 1457 05/24/15 1120  WBC 14.4* 13.0*  HGB 13.7 13.6  PLT 136.0* 136*  CREATININE 1.20 1.50*   CrCl cannot be calculated (Unknown ideal weight.). No results for input(s): VANCOTROUGH, VANCOPEAK, VANCORANDOM, GENTTROUGH, GENTPEAK, GENTRANDOM, TOBRATROUGH, TOBRAPEAK, TOBRARND, AMIKACINPEAK, AMIKACINTROU, AMIKACIN in the last 72 hours.   Microbiology: No results found for this or any previous visit (from the past 720 hour(s)).  Medical History: Past Medical History  Diagnosis Date  . Hypopotassemia     PMH of  . Anemia     iron defficiency  . Type II or unspecified type diabetes mellitus without mention of complication, uncontrolled     Dr Loanne Drilling  . Muscle pain   . Degenerative joint disease   . Atrial fibrillation or flutter     s/p RFCA 7/08;   s/p DCCV in past;   previously on amiodarone;  amio stopped due to lung toxicity  . CAD (coronary artery disease)     s/p NSTEMI tx with BMS to OM1 3/08;  cath 3/08: pOM 99% tx with PCI, pLAD 20%, ? mod stenosis at the AM  . HLD (hyperlipidemia)   . HTN (hypertension)     essential nos  . Dystrophy, corneal stromal   . Chronic diastolic heart failure     echo 11/11:  EF 55-60%, severe LVH, mod LAE, mild MR, mildly increased PASP  . Allergy     seasonal  . Cataract   . CHF (congestive heart failure)   .  Myocardial infarction 2008  . Osteoporosis   . Thyroid disease     hypothyroidism    Medications:  Anti-infectives    None     Assessment: 81 year old female with r/o CAP to start Levaquin per pharmacy dosing.   WBC 13, SCr up to 1.50 >> estimated CrCl ~35-45 ml/min. Tmax 99.2.  CT Chest- moderate consolidation in LLL  Goal of Therapy:  Clinical resolution of infection  Plan:  Levaquin 750mg  IV q48h.   Sloan Leiter, PharmD, BCPS Clinical Pharmacist [phone] 05/24/2015,3:23 PM

## 2015-05-24 NOTE — ED Notes (Signed)
Pts CBG rechecked 383 reported to RN Eme.

## 2015-05-24 NOTE — ED Notes (Signed)
Pt placed on monitor upon return to room from radiology. Pt remains monitored by blood pressure, pulse ox, and 5 lead.  

## 2015-05-24 NOTE — ED Notes (Signed)
Pts CBG 444 reported to RN.

## 2015-05-24 NOTE — ED Notes (Signed)
Called main lab spoke with [name] will send add on for urine culture to lab.

## 2015-05-24 NOTE — ED Notes (Signed)
Pt reports SOB since Wednesday. History of CHF and atrial fibrillation. Reports increased shortness of breath with movement. Speaking complete sentences upon arrival to ED. Denies pain. Pt is alert and oriented x4.

## 2015-05-24 NOTE — ED Notes (Signed)
Spoke with patient and family member at bedside. Son indicated patient calling for family member by name. Thought they were in hallway. Patient alert answering and following commands appropriate.

## 2015-05-24 NOTE — ED Notes (Signed)
Patient transported to X-ray 

## 2015-05-24 NOTE — ED Notes (Signed)
Pt has history of chf and here with sob at rest and exertion.  RR 32 and 87%/ RA and placed on 2L at triage.  No chest pain, but reports some ankle swelling.

## 2015-05-24 NOTE — Progress Notes (Signed)
PHARMACY JUST CALLED- PT WITH PCN ALLERGY OF RASH- STATES LEVAQUIN WILL COVER ASPIRATION PNA SO WILL RE-ORDER THAT MED  Erin Hearing ANP

## 2015-05-24 NOTE — H&P (Signed)
[facility] History and Physical                                                                                    [name], is a 81 y.o. female  MRN: [id number]   DOB - Aug 26, 1931  Admit Date - 05/24/2015  Outpatient Primary MD for the patient is [name], MD  Referring MD: Tawnya Crook / ER  With History of -  Past Medical History  Diagnosis Date  . Hypopotassemia     PMH of  . Anemia     iron defficiency  . Type II or unspecified type diabetes mellitus without mention of complication, uncontrolled     Dr Loanne Drilling  . Muscle pain   . Degenerative joint disease   . Atrial fibrillation or flutter     s/p RFCA 7/08;   s/p DCCV in past;   previously on amiodarone;  amio stopped due to lung toxicity  . CAD (coronary artery disease)     s/p NSTEMI tx with BMS to OM1 3/08;  cath 3/08: pOM 99% tx with PCI, pLAD 20%, ? mod stenosis at the AM  . HLD (hyperlipidemia)   . HTN (hypertension)     essential nos  . Dystrophy, corneal stromal   . Chronic diastolic heart failure     echo 11/11:  EF 55-60%, severe LVH, mod LAE, mild MR, mildly increased PASP  . Allergy     seasonal  . Cataract   . CHF (congestive heart failure)   . Myocardial infarction 2008  . Osteoporosis   . Thyroid disease     hypothyroidism      Past Surgical History  Procedure Laterality Date  . Cholecystectomy    . Knee surgery    . Eye surgery      cataract surgery & corneal implant bilaterally  . A flutter ablation      Dr Lovena Le  . Gravid 2 para 2    . Colonoscopy  6/07    2 polyps Dr. Kinnie Feil in [place]  . Bare metal stent  02/2007    Dr Lovena Le  . Colonoscopy with polypectomy       X 2; [facility]  . Left and right heart catheterization with coronary angiogram N/A 05/07/2014    Procedure: LEFT AND RIGHT HEART CATHETERIZATION WITH CORONARY ANGIOGRAM;  Surgeon: Peter M Martinique, MD;  Location: [facility] CATH LAB;  Service: Cardiovascular;  Laterality: N/A;    in for   Chief Complaint  Patient  presents with  . Shortness of Breath     HPI This is a 82 yo female, PMH of : HTN pulm ,HTN, OSA on CPAP, DM on insulin, hyperthyroidism on Tapazole, chronic DHF and atrial flutter on Eliquis. Initially presented to PCP 5/27 after 2 days of SOB/DOE with NP cough which was associated with myalgias and nausea. Felt to have acute DHF exac so home Lasix doubled w/o improvement in sx's. PCP instructed pt to come to ER today. Found with leukocytosis, hypoxia and CXR with LLL haziness. CT chest w/o contrast confirmed LLL PNA. Given Levaquin in ER before blood cx's obtained. Sxs slightly better w/ oxygen. Pt c/o thirst  and UA and renal function concerning for mild DH. CBGs elevated in 400 range since onset of illness. Otherwise hemodynamically stable with low-grade fever 99.2 with minimal tachycardia ventricular rates in the low 100s.   Review of Systems   In addition to the HPI above,  No Headache, changes with Vision or hearing, new weakness, tingling, numbness in any extremity, No problems swallowing food or Liquids, indigestion/reflux No Chest pain,palpitations, orthopnea  No Abdominal pain, no melena or hematochezia, no dark tarry stools, Bowel movements are regular, No dysuria, hematuria or flank pain No new skin rashes, lesions, masses or bruises, No new joints pains-aches No recent weight gain or loss   *A full 10 point Review of Systems was done, except as stated above, all other Review of Systems were negative.  Social History History  Substance Use Topics  . Smoking status: Former Smoker -- 0.25 packs/day for 18 years    Types: Cigarettes    Quit date: 12/27/1968  . Smokeless tobacco: Never Used     Comment: smoked [place], up to 1 pp week  . Alcohol Use: No    Resides at: Private residence  Lives with: Alone  Ambulatory status: w/o assistive devices prior to admission   Family History Family History  Problem Relation Age of Onset  . Diabetes Mother   . Hypertension  Mother   . Transient ischemic attack Mother   . Heart attack Father 103  . Arthritis Father   . Breast cancer Maternal Aunt   . Arthritis Maternal Aunt   . Arthritis Mother   . Hypertension Father      Prior to Admission medications   Medication Sig Start Date End Date Taking? Authorizing Provider  apixaban (ELIQUIS) 5 MG TABS tablet Take 1 tablet (5 mg total) by mouth 2 (two) times daily. 05/29/14  Yes Jolaine Artist, MD  carvedilol (COREG) 25 MG tablet Take 50 mg by mouth 2 (two) times daily. 03/13/15  Yes Historical Provider, MD  furosemide (LASIX) 40 MG tablet Take 80 mg by mouth daily.  05/29/14  Yes Shaune Pascal Bensimhon, MD  glucose blood (FREESTYLE LITE) test strip Use to check blood sugar 2 time per day. 02/04/15  Yes Renato Shin, MD  Insulin Lispro Prot & Lispro (HUMALOG MIX 75/25 KWIKPEN) (75-25) 100 UNIT/ML Kwikpen Inject 35 Units into the skin every morning. And pen needles 2/day Patient taking differently: Inject 40 Units into the skin every morning. And pen needles 2/day 04/28/15  Yes Renato Shin, MD  Insulin Pen Needle (PEN NEEDLES 31GX5/16") 31G X 8 MM MISC Use to inject insulin 1 times per day. DX Code E11.9 05/13/15  Yes Renato Shin, MD  methimazole (TAPAZOLE) 5 MG tablet Take 5 mg by mouth 3 (three) times a week.    Yes Historical Provider, MD  nitroGLYCERIN (NITROSTAT) 0.4 MG SL tablet Place 0.4 mg under the tongue every 5 (five) minutes as needed for chest pain. 05/02/14  Yes Evans Lance, MD  potassium chloride SA (K-DUR,KLOR-CON) 20 MEQ tablet Take 2 tabs every other day alternating with 1 tab every other day Patient taking differently: Take 20 mEq by mouth 2 (two) times daily.  05/05/15  Yes Jolaine Artist, MD  rosuvastatin (CRESTOR) 20 MG tablet Take 1 tablet (20 mg total) by mouth daily. 01/13/15  Yes Hendricks Limes, MD  tiotropium (SPIRIVA) 18 MCG inhalation capsule Place 1 capsule (18 mcg total) into inhaler and inhale daily. 04/14/15  Yes Hendricks Limes, MD  carvedilol (COREG) 12.5 MG tablet Take 1 tablet (12.5 mg total) by mouth 2 (two) times daily with a meal. Patient not taking: Reported on 05/24/2015 04/10/15   Jolaine Artist, MD    Allergies  Allergen Reactions  . Amlodipine Besylate     REACTION: tingling in lips & gum edema  . Lobster [Shellfish Allergy]     angioedema  . Penicillins     REACTION: rash  . Valsartan     REACTION: angioedema  . Codeine     Mental status changes  . Tramadol Hcl     REACTION: nausea    Physical Exam  Vitals  Blood pressure 143/50, pulse 94, temperature 99.2 F (37.3 C), temperature source Oral, resp. rate 31, SpO2 96 %.   General:  In mild acute distress as evidenced by tachypnea, appears acutely ill  Psych:  Normal affect, Denies Suicidal or Homicidal ideations, Awake Alert, Oriented X 3. Speech and thought patterns are clear and appropriate, no apparent short term memory deficits  Neuro:   No focal neurological deficits, CN II through XII intact, Strength 5/5 all 4 extremities, Sensation intact all 4 extremities.  ENT:  Ears and Eyes appear Normal, Conjunctivae clear, PER. Moist oral mucosa without erythema or exudates.  Neck:  Supple, No lymphadenopathy appreciated  Respiratory:  Symmetrical chest wall movement, lung sounds with generalized coarseness with some fine expiratory crackles bilaterally noted sounds are quite diminished left mid field to base, tachypnea present, 2L  Cardiac:  Irregular-atrial flutter, No Murmurs, no LE edema noted, no JVD, No carotid bruits, peripheral pulses palpable at 2+  Abdomen:  Positive bowel sounds, Soft, Non tender, Non distended,  No masses appreciated, no obvious hepatosplenomegaly  Skin:  No Cyanosis, poor Skin Turgor, No Skin Rash or Bruise. Skin hot to touch  Extremities: Symmetrical without obvious trauma or injury,  no effusions.  Data Review  CBC  Recent Labs Lab 05/23/15 1457 05/24/15 1120  WBC 14.4* 13.0*  HGB 13.7 13.6   HCT 42.0 41.8  PLT 136.0* 136*  MCV 88.7 89.1  MCH  --  29.0  MCHC 32.6 32.5  RDW 17.1* 16.2*  LYMPHSABS 0.4* 0.4*  MONOABS 0.6 0.6  EOSABS 0.0 0.0  BASOSABS 0.0 0.0    Chemistries   Recent Labs Lab 05/23/15 1457 05/24/15 1120  NA 130* 131*  K 4.4 4.4  CL 94* 92*  CO2 28 26  GLUCOSE 494* 482*  BUN 20 23*  CREATININE 1.20 1.50*  CALCIUM 9.1 9.0    CrCl cannot be calculated (Unknown ideal weight.).  No results for input(s): TSH, T4TOTAL, T3FREE, THYROIDAB in the last 72 hours.  Invalid input(s): FREET3  Coagulation profile No results for input(s): INR, PROTIME in the last 168 hours.  No results for input(s): DDIMER in the last 72 hours.  Cardiac Enzymes No results for input(s): CKMB, TROPONINI, MYOGLOBIN in the last 168 hours.  Invalid input(s): CK  Invalid input(s): POCBNP  Urinalysis    Component Value Date/Time   COLORURINE YELLOW 05/24/2015 1238   APPEARANCEUR CLOUDY* 05/24/2015 1238   LABSPEC 1.020 05/24/2015 1238   PHURINE 5.0 05/24/2015 1238   GLUCOSEU >1000* 05/24/2015 1238   HGBUR LARGE* 05/24/2015 1238   BILIRUBINUR NEGATIVE 05/24/2015 [place] 05/24/2015 1238   PROTEINUR 100* 05/24/2015 1238   UROBILINOGEN 1.0 05/24/2015 1238   NITRITE NEGATIVE 05/24/2015 1238   LEUKOCYTESUR SMALL* 05/24/2015 1238    Imaging results:   Dg Chest 2 View  05/24/2015  CLINICAL DATA:  Dizziness, weakness, inability to walk, high blood sugarHx of congestion heart failure, DM, HTN, CAD, myocardial infarction  EXAM: CHEST  2 VIEW  COMPARISON:  05/23/2015  FINDINGS: Cardiac silhouette borderline enlarged but normal in configuration. No mediastinal or hilar masses or convincing adenopathy.  Lungs are clear.  No pleural effusion or pneumothorax.  Bony thorax is intact.  IMPRESSION: No acute cardiopulmonary disease.   Electronically Signed   By: Lajean Manes M.D.   On: 05/24/2015 11:40   Dg Chest 2 View  05/23/2015   CLINICAL DATA:  Shortness of  breath.  EXAM: CHEST  2 VIEW  COMPARISON:  05/21/2014.  FINDINGS: Mediastinum and hilar structures normal. Cardiomegaly with bilateral pulmonary interstitial prominence consistent with congestive heart failure. Pneumonitis cannot be excluded. No pleural effusion or pneumothorax. Diffuse osteopenia with multiple mild stable thoracic spine compression fractures.  IMPRESSION: Cardiomegaly with bilateral pulmonary interstitial prominence consistent with congestive heart failure. Bilateral pneumonitis cannot be excluded.   Electronically Signed   By: Marcello Moores  Register   On: 05/23/2015 15:23   Ct Chest Wo Contrast  05/24/2015   CLINICAL DATA:  Shortness of breath and generalized weakness.  EXAM: CT CHEST WITHOUT CONTRAST  TECHNIQUE: Multidetector CT imaging of the chest was performed following the standard protocol without IV contrast.  COMPARISON:  Chest x-ray today as well as CT 12/24/2008  FINDINGS: Lungs are adequately inflated and demonstrate moderate airspace consolidation over the left lower lobe likely a pneumonia. Subtle linear atelectasis/scarring over the right lower lobe. Mild centrilobular emphysematous disease over the mid to upper lungs. Airways are within normal.  Mild prominence of the heart. There is a tiny amount of pericardial fluid present. Evidence of 3 vessel atherosclerotic coronary artery disease. No significant mediastinal, hilar or axillary adenopathy. Moderate calcified plaque over the thoracic aorta. Fluid within the distal esophagus which may be due to reflux versus dysmotility.  Images through the upper abdomen demonstrate focal calcification over the dome of the liver and right lobe the liver unchanged. There are degenerative changes of the spine.  IMPRESSION: Moderate airspace consolidation over the left lower lobe compatible with a pneumonia. Recommend follow-up chest radiograph in 4 weeks to document resolution.  Borderline cardiomegaly with 3 vessel atherosclerotic coronary artery  disease. Tiny amount of pericardial fluid.  Fluid in the distal esophagus likely due to dysmotility or reflux.   Electronically Signed   By: Marin Olp M.D.   On: 05/24/2015 13:40     EKG: (Independently reviewed) atrial flutter with variable conduction, normal QTC, no ischemic changes, ventricular rates low 100s   Assessment & Plan  Principal Problem:   Acute respiratory failure with hypoxia/CAP/SEPSIS -FEVER-LEUKOCYTOSIS,TACHYPNEA, TACHYCARDIA -SX'S WORSE AFTER EMESIS SO ?? ASP PNA -Admit to TELEMETRY floor -Follow up blood cultures and sputum culture -CHANGE TO ZOSYN-PHARMACY TO DOSE -Check HIV, urinary strep and Legionella -Supportive care with oxygen, nebs and mucolytics -Tylenol for fever and myalgia -Check influenza PCR  Active Problems:   Uncontrolled type 2 diabetes mellitus with insulin therapy -Continue home insulin noting was on 75/25-will need to utilize 70/30 here -Sugars poorly controlled in setting of acute infection -AG = 13 -Moderate sliding scale insulin -Hemoglobin A1c      Dehydration -Hold diuretics -Given 1 L fluid in the ER-cont NS at 75 per hour -Suspect combination hyperglycemia and upper respiratory infection contributing to volume loss    HTN  -Continue home medications    Atrial flutter -Continue home medications for rate control -Continue eliquis for  anticoagulation -Currently rate controlled    Chronic diastolic heart failure -Compensated -Holding Lasix as above    OSA on CPAP -Continue nocturnal CPAP using home supplies    HYPERLIPIDEMIA -cont statin    Iron deficiency anemia -Hemoglobin stable and at baseline of 13    Hyperthyroidism -Continue Tapazole -If develop significant tachycardia will need to check TSH; no signs of thyroid storm at this juncture    Pulmonary HTN -Followed as outpatient by Dr. Gwenette Greet    Abnormal urinalysis -Concerning for possible UTI -Check culture -Current Levaquin should cover gram  negatives    DVT Prophylaxis: Eliquis  Family Communication:  Son at Bedside  Code Status:  Full code  Condition:  Stable  Discharge disposition: Return to home after resolution of respiratory symptoms and correction of hyperglycemia  Time spent in minutes : 60      Dalton Molesworth L. ANP on 05/24/2015 at 3:50 PM  Between 7am to 7pm - Pager - [phone]  After 7pm go to [email or web address] - password [email or web address]  And look for the night coverage person covering me after hours  [facility]

## 2015-05-24 NOTE — ED Provider Notes (Signed)
CSN: [id number]     Arrival date & time 05/24/15  1047 History   First MD Initiated Contact with Patient 05/24/15 1055     Chief Complaint  Patient presents with  . Shortness of Breath     (Consider location/radiation/quality/duration/timing/severity/associated sxs/prior Treatment) HPI  [name] is a 81 y.o. female with PMH of CHF, atrial flutter, on all requests, hypertension, dyslipidemia, CAD presenting with one week of worsening dyspnea on exertion with acute worsening today. Patient presented to her primary care provider yesterday who increased her Lasix dose from ED to 160. Patient denies significant improvement. Patient has new dyspnea at rest. She denies any chest pain. No cough. Patient endorses 2-4 pound weight gain but states it fluctuates. Mild peripheral edema. No Fevers chills. Patient was told to hold Crestor due to muscle aches.   Past Medical History  Diagnosis Date  . Hypopotassemia     PMH of  . Anemia     iron defficiency  . Type II or unspecified type diabetes mellitus without mention of complication, uncontrolled     Dr Loanne Drilling  . Muscle pain   . Degenerative joint disease   . Atrial fibrillation or flutter     s/p RFCA 7/08;   s/p DCCV in past;   previously on amiodarone;  amio stopped due to lung toxicity  . CAD (coronary artery disease)     s/p NSTEMI tx with BMS to OM1 3/08;  cath 3/08: pOM 99% tx with PCI, pLAD 20%, ? mod stenosis at the AM  . HLD (hyperlipidemia)   . HTN (hypertension)     essential nos  . Dystrophy, corneal stromal   . Chronic diastolic heart failure     echo 11/11:  EF 55-60%, severe LVH, mod LAE, mild MR, mildly increased PASP  . Allergy     seasonal  . Cataract   . CHF (congestive heart failure)   . Myocardial infarction 2008  . Osteoporosis   . Thyroid disease     hypothyroidism   Past Surgical History  Procedure Laterality Date  . Cholecystectomy    . Knee surgery    . Eye surgery      cataract surgery &  corneal implant bilaterally  . A flutter ablation      Dr Lovena Le  . Gravid 2 para 2    . Colonoscopy  6/07    2 polyps Dr. Kinnie Feil in [place]  . Bare metal stent  02/2007    Dr Lovena Le  . Colonoscopy with polypectomy       X 2; [facility]  . Left and right heart catheterization with coronary angiogram N/A 05/07/2014    Procedure: LEFT AND RIGHT HEART CATHETERIZATION WITH CORONARY ANGIOGRAM;  Surgeon: Peter M Martinique, MD;  Location: [facility] CATH LAB;  Service: Cardiovascular;  Laterality: N/A;   Family History  Problem Relation Age of Onset  . Diabetes Mother   . Hypertension Mother   . Transient ischemic attack Mother   . Heart attack Father 39  . Arthritis Father   . Breast cancer Maternal Aunt   . Arthritis Maternal Aunt   . Arthritis Mother   . Hypertension Father    History  Substance Use Topics  . Smoking status: Former Smoker -- 0.25 packs/day for 18 years    Types: Cigarettes    Quit date: 12/27/1968  . Smokeless tobacco: Never Used     Comment: smoked [place], up to 1 pp week  . Alcohol Use: No  OB History    No data available     Review of Systems 10 Systems reviewed and are negative for acute change except as noted in the HPI.    Allergies  Amlodipine besylate; Lobster; Penicillins; Valsartan; Codeine; and Tramadol hcl  Home Medications   Prior to Admission medications   Medication Sig Start Date End Date Taking? Authorizing Provider  apixaban (ELIQUIS) 5 MG TABS tablet Take 1 tablet (5 mg total) by mouth 2 (two) times daily. 05/29/14  Yes Jolaine Artist, MD  carvedilol (COREG) 25 MG tablet Take 50 mg by mouth 2 (two) times daily. 03/13/15  Yes Historical Provider, MD  furosemide (LASIX) 40 MG tablet Take 80 mg by mouth daily.  05/29/14  Yes Shaune Pascal Bensimhon, MD  glucose blood (FREESTYLE LITE) test strip Use to check blood sugar 2 time per day. 02/04/15  Yes Renato Shin, MD  Insulin Lispro Prot & Lispro (HUMALOG MIX 75/25 KWIKPEN) (75-25) 100 UNIT/ML Kwikpen  Inject 35 Units into the skin every morning. And pen needles 2/day Patient taking differently: Inject 40 Units into the skin every morning. And pen needles 2/day 04/28/15  Yes Renato Shin, MD  Insulin Pen Needle (PEN NEEDLES 31GX5/16") 31G X 8 MM MISC Use to inject insulin 1 times per day. DX Code E11.9 05/13/15  Yes Renato Shin, MD  methimazole (TAPAZOLE) 5 MG tablet Take 5 mg by mouth 3 (three) times a week.    Yes Historical Provider, MD  nitroGLYCERIN (NITROSTAT) 0.4 MG SL tablet Place 0.4 mg under the tongue every 5 (five) minutes as needed for chest pain. 05/02/14  Yes Evans Lance, MD  potassium chloride SA (K-DUR,KLOR-CON) 20 MEQ tablet Take 2 tabs every other day alternating with 1 tab every other day Patient taking differently: Take 20 mEq by mouth 2 (two) times daily.  05/05/15  Yes Jolaine Artist, MD  rosuvastatin (CRESTOR) 20 MG tablet Take 1 tablet (20 mg total) by mouth daily. 01/13/15  Yes Hendricks Limes, MD  tiotropium (SPIRIVA) 18 MCG inhalation capsule Place 1 capsule (18 mcg total) into inhaler and inhale daily. 04/14/15  Yes Hendricks Limes, MD  carvedilol (COREG) 12.5 MG tablet Take 1 tablet (12.5 mg total) by mouth 2 (two) times daily with a meal. Patient not taking: Reported on 05/24/2015 04/10/15   Shaune Pascal Bensimhon, MD   BP 143/50 mmHg  Pulse 94  Temp(Src) 99.2 F (37.3 C) (Oral)  Resp 31  SpO2 96% Physical Exam  Constitutional: She appears well-developed and well-nourished. No distress.  HENT:  Head: Normocephalic and atraumatic.  Eyes: Conjunctivae and EOM are normal. Right eye exhibits no discharge. Left eye exhibits no discharge.  Cardiovascular: Normal rate and regular rhythm.   Pulmonary/Chest: Effort normal and breath sounds normal. No respiratory distress. She has no wheezes.  Abdominal: Soft. Bowel sounds are normal. She exhibits no distension. There is no tenderness.  Neurological: She is alert. She exhibits normal muscle tone. Coordination normal.   Skin: Skin is warm and dry. She is not diaphoretic.  Nursing note and vitals reviewed.   ED Course  Procedures (including critical care time) Labs Review Labs Reviewed  CBC WITH DIFFERENTIAL/PLATELET - Abnormal; Notable for the following:    WBC 13.0 (*)    RDW 16.2 (*)    Platelets 136 (*)    Neutrophils Relative % 92 (*)    Neutro Abs 12.0 (*)    Lymphocytes Relative 3 (*)    Lymphs Abs 0.4 (*)  All other components within normal limits  BASIC METABOLIC PANEL - Abnormal; Notable for the following:    Sodium 131 (*)    Chloride 92 (*)    Glucose, Bld 482 (*)    BUN 23 (*)    Creatinine, Ser 1.50 (*)    GFR calc non Af Amer 31 (*)    GFR calc Af Amer 36 (*)    All other components within normal limits  BRAIN NATRIURETIC PEPTIDE - Abnormal; Notable for the following:    B Natriuretic Peptide 400.8 (*)    All other components within normal limits  URINALYSIS, ROUTINE W REFLEX MICROSCOPIC (NOT AT [facility]) - Abnormal; Notable for the following:    APPearance CLOUDY (*)    Glucose, UA >1000 (*)    Hgb urine dipstick LARGE (*)    Protein, ur 100 (*)    Leukocytes, UA SMALL (*)    All other components within normal limits  URINE MICROSCOPIC-ADD ON - Abnormal; Notable for the following:    Squamous Epithelial / LPF FEW (*)    Bacteria, UA MANY (*)    Casts GRANULAR CAST (*)    All other components within normal limits  CBG MONITORING, ED - Abnormal; Notable for the following:    Glucose-Capillary 444 (*)    All other components within normal limits  CBG MONITORING, ED - Abnormal; Notable for the following:    Glucose-Capillary 458 (*)    All other components within normal limits  CBG MONITORING, ED - Abnormal; Notable for the following:    Glucose-Capillary 432 (*)    All other components within normal limits  URINE CULTURE  INFLUENZA PANEL BY PCR (TYPE A & B, H1N1)  I-STAT TROPOININ, ED    Imaging Review Dg Chest 2 View  05/24/2015   CLINICAL DATA:  Dizziness,  weakness, inability to walk, high blood sugarHx of congestion heart failure, DM, HTN, CAD, myocardial infarction  EXAM: CHEST  2 VIEW  COMPARISON:  05/23/2015  FINDINGS: Cardiac silhouette borderline enlarged but normal in configuration. No mediastinal or hilar masses or convincing adenopathy.  Lungs are clear.  No pleural effusion or pneumothorax.  Bony thorax is intact.  IMPRESSION: No acute cardiopulmonary disease.   Electronically Signed   By: Lajean Manes M.D.   On: 05/24/2015 11:40   Dg Chest 2 View  05/23/2015   CLINICAL DATA:  Shortness of breath.  EXAM: CHEST  2 VIEW  COMPARISON:  05/21/2014.  FINDINGS: Mediastinum and hilar structures normal. Cardiomegaly with bilateral pulmonary interstitial prominence consistent with congestive heart failure. Pneumonitis cannot be excluded. No pleural effusion or pneumothorax. Diffuse osteopenia with multiple mild stable thoracic spine compression fractures.  IMPRESSION: Cardiomegaly with bilateral pulmonary interstitial prominence consistent with congestive heart failure. Bilateral pneumonitis cannot be excluded.   Electronically Signed   By: Marcello Moores  Register   On: 05/23/2015 15:23   Ct Chest Wo Contrast  05/24/2015   CLINICAL DATA:  Shortness of breath and generalized weakness.  EXAM: CT CHEST WITHOUT CONTRAST  TECHNIQUE: Multidetector CT imaging of the chest was performed following the standard protocol without IV contrast.  COMPARISON:  Chest x-ray today as well as CT 12/24/2008  FINDINGS: Lungs are adequately inflated and demonstrate moderate airspace consolidation over the left lower lobe likely a pneumonia. Subtle linear atelectasis/scarring over the right lower lobe. Mild centrilobular emphysematous disease over the mid to upper lungs. Airways are within normal.  Mild prominence of the heart. There is a tiny amount of pericardial fluid present.  Evidence of 3 vessel atherosclerotic coronary artery disease. No significant mediastinal, hilar or axillary  adenopathy. Moderate calcified plaque over the thoracic aorta. Fluid within the distal esophagus which may be due to reflux versus dysmotility.  Images through the upper abdomen demonstrate focal calcification over the dome of the liver and right lobe the liver unchanged. There are degenerative changes of the spine.  IMPRESSION: Moderate airspace consolidation over the left lower lobe compatible with a pneumonia. Recommend follow-up chest radiograph in 4 weeks to document resolution.  Borderline cardiomegaly with 3 vessel atherosclerotic coronary artery disease. Tiny amount of pericardial fluid.  Fluid in the distal esophagus likely due to dysmotility or reflux.   Electronically Signed   By: Marin Olp M.D.   On: 05/24/2015 13:40     EKG Interpretation   Date/Time:  Saturday May 24 2015 10:58:34 EDT Ventricular Rate:  100 PR Interval:    QRS Duration: 114 QT Interval:  336 QTC Calculation: 433 R Axis:   -73 Text Interpretation:  Atrial flutter with variable A-V block Left axis  deviation Incomplete right bundle branch block Left ventricular  hypertrophy with repolarization abnormality Abnormal ECG No significant  change since last tracing Confirmed by DOCHERTY  MD, MEGAN ([id number]) on  05/24/2015 11:16:24 AM      Meds given in ED:  Medications  ipratropium-albuterol (DUONEB) 0.5-2.5 (3) MG/3ML nebulizer solution 3 mL (not administered)  0.9 %  sodium chloride infusion (not administered)  furosemide (LASIX) injection 40 mg (40 mg Intravenous Given 05/24/15 1159)  sodium chloride 0.9 % bolus 500 mL (0 mLs Intravenous Stopped 05/24/15 1340)  insulin aspart (novoLOG) injection 5 Units (5 Units Intravenous Given 05/24/15 1236)  insulin aspart (novoLOG) injection 10 Units (10 Units Intravenous Given 05/24/15 1506)  sodium chloride 0.9 % bolus 500 mL (500 mLs Intravenous New Bag/Given 05/24/15 1506)    New Prescriptions   No medications on file      MDM   Final diagnoses:  Hyperglycemia   CAP (community acquired pneumonia)  UTI (lower urinary tract infection)  Hypoxia   Patient presenting with worsening shortness of breath with history of congestive heart failure and atrial fibrillation. She denies fevers chills or cough. She is afebrile in the ED she did have initial tachycardia tachypnea and hypoxia and was placed on 2 L nasal cannula with stable oxygen saturations. Patient denies chest pain. Patient does not appear volume overloaded. Decreased air movement throughout. Chest x-ray without edema or consolidation.. Patient with bump in creatinine as well as electrolyte abnormalities consistent with diuresis. Gentle fluids prescribed. Patient also with hyperglycemia. She's been given 10 L insulin as well as total of 1 L fluid. Patient also with leukocytosis. Patient does not appear to have CHF exacerbation. Question infectious symptoms. CT chest with evidence of left lower lobe pneumonia. Levaquin ordered per pharmacy consultation. Patient with new oxygen requirement. She also with bacteria and some urinary symptoms. Culture ordered. Consult to hospitalist. Spoke with Dr. Erin Hearing who agrees to evaluate patient with plan for admission.  Discussed return precautions with patient. Discussed all results and patient verbalizes understanding and agrees with plan.  This is a shared patient. This patient was discussed with the physician who saw and evaluated the patient and agrees with the plan.  Filed Vitals:   05/24/15 1335 05/24/15 1345 05/24/15 1359 05/24/15 1427  BP: 147/100 143/50 143/50   Pulse: 102 108 94   Temp:    99.2 F (37.3 C)  TempSrc:    Oral  Resp: 24 27 31    SpO2: 96% 100% 96%         Al Corpus, PA-C 05/24/15 [place], MD 05/24/15 1652

## 2015-05-25 ENCOUNTER — Inpatient Hospital Stay (HOSPITAL_COMMUNITY): Payer: Medicare Other

## 2015-05-25 LAB — BLOOD GAS, ARTERIAL
Acid-Base Excess: 3.6 mmol/L — ABNORMAL HIGH (ref 0.0–2.0)
Bicarbonate: 27.4 mEq/L — ABNORMAL HIGH (ref 20.0–24.0)
Drawn by: 105521
FIO2: 0.55 %
O2 SAT: 91.7 %
PH ART: 7.449 (ref 7.350–7.450)
Patient temperature: 98.6
TCO2: 28.6 mmol/L (ref 0–100)
pCO2 arterial: 40.1 mmHg (ref 35.0–45.0)
pO2, Arterial: 62.3 mmHg — ABNORMAL LOW (ref 80.0–100.0)

## 2015-05-25 LAB — CBC WITH DIFFERENTIAL/PLATELET
Basophils Absolute: 0 10*3/uL (ref 0.0–0.1)
Basophils Relative: 0 % (ref 0–1)
Eosinophils Absolute: 0 10*3/uL (ref 0.0–0.7)
Eosinophils Relative: 0 % (ref 0–5)
HCT: 38.8 % (ref 36.0–46.0)
Hemoglobin: 12.5 g/dL (ref 12.0–15.0)
Lymphocytes Relative: 3 % — ABNORMAL LOW (ref 12–46)
Lymphs Abs: 0.4 10*3/uL — ABNORMAL LOW (ref 0.7–4.0)
MCH: 28.6 pg (ref 26.0–34.0)
MCHC: 32.2 g/dL (ref 30.0–36.0)
MCV: 88.8 fL (ref 78.0–100.0)
MONOS PCT: 3 % (ref 3–12)
Monocytes Absolute: 0.5 10*3/uL (ref 0.1–1.0)
Neutro Abs: 13.6 10*3/uL — ABNORMAL HIGH (ref 1.7–7.7)
Neutrophils Relative %: 94 % — ABNORMAL HIGH (ref 43–77)
PLATELETS: 129 10*3/uL — AB (ref 150–400)
RBC: 4.37 MIL/uL (ref 3.87–5.11)
RDW: 16.2 % — ABNORMAL HIGH (ref 11.5–15.5)
WBC: 14.4 10*3/uL — ABNORMAL HIGH (ref 4.0–10.5)

## 2015-05-25 LAB — BASIC METABOLIC PANEL
ANION GAP: 10 (ref 5–15)
BUN: 24 mg/dL — ABNORMAL HIGH (ref 6–20)
CALCIUM: 8.6 mg/dL — AB (ref 8.9–10.3)
CO2: 26 mmol/L (ref 22–32)
CREATININE: 1.46 mg/dL — AB (ref 0.44–1.00)
Chloride: 99 mmol/L — ABNORMAL LOW (ref 101–111)
GFR calc Af Amer: 38 mL/min — ABNORMAL LOW (ref >60)
GFR calc non Af Amer: 33 mL/min — ABNORMAL LOW (ref >60)
Glucose, Bld: 327 mg/dL — ABNORMAL HIGH (ref 65–99)
POTASSIUM: 4 mmol/L (ref 3.5–5.1)
Sodium: 135 mmol/L (ref 135–145)

## 2015-05-25 LAB — GLUCOSE, CAPILLARY
GLUCOSE-CAPILLARY: 310 mg/dL — AB (ref 65–99)
GLUCOSE-CAPILLARY: 269 mg/dL — AB (ref 65–99)
GLUCOSE-CAPILLARY: 255 mg/dL — AB (ref 65–99)
Glucose-Capillary: 336 mg/dL — ABNORMAL HIGH (ref 65–99)
Glucose-Capillary: 339 mg/dL — ABNORMAL HIGH (ref 65–99)
Glucose-Capillary: 446 mg/dL — ABNORMAL HIGH (ref 65–99)

## 2015-05-25 LAB — HIV ANTIBODY (ROUTINE TESTING W REFLEX): HIV SCREEN 4TH GENERATION: NONREACTIVE

## 2015-05-25 MED ORDER — ALBUTEROL SULFATE (2.5 MG/3ML) 0.083% IN NEBU
2.5000 mg | INHALATION_SOLUTION | RESPIRATORY_TRACT | Status: DC | PRN
  Administered 2015-05-25: 2.5 mg via RESPIRATORY_TRACT

## 2015-05-25 MED ORDER — ALBUTEROL SULFATE (2.5 MG/3ML) 0.083% IN NEBU
INHALATION_SOLUTION | RESPIRATORY_TRACT | Status: AC
  Filled 2015-05-25: qty 3

## 2015-05-25 MED ORDER — INSULIN GLARGINE 100 UNIT/ML ~~LOC~~ SOLN
25.0000 [IU] | Freq: Every day | SUBCUTANEOUS | Status: DC
  Administered 2015-05-25 – 2015-05-27 (×3): 25 [IU] via SUBCUTANEOUS
  Filled 2015-05-25 (×4): qty 0.25

## 2015-05-25 MED ORDER — LEVALBUTEROL HCL 0.63 MG/3ML IN NEBU
0.6300 mg | INHALATION_SOLUTION | Freq: Four times a day (QID) | RESPIRATORY_TRACT | Status: DC
  Administered 2015-05-25 – 2015-05-27 (×6): 0.63 mg via RESPIRATORY_TRACT
  Filled 2015-05-25 (×6): qty 3

## 2015-05-25 MED ORDER — FUROSEMIDE 10 MG/ML IJ SOLN
40.0000 mg | Freq: Once | INTRAMUSCULAR | Status: AC
  Administered 2015-05-25: 40 mg via INTRAVENOUS
  Filled 2015-05-25: qty 4

## 2015-05-25 MED ORDER — CARVEDILOL 25 MG PO TABS
25.0000 mg | ORAL_TABLET | Freq: Two times a day (BID) | ORAL | Status: DC
  Administered 2015-05-25 – 2015-05-26 (×3): 25 mg via ORAL
  Filled 2015-05-25: qty 1
  Filled 2015-05-25: qty 2
  Filled 2015-05-25: qty 1

## 2015-05-25 MED ORDER — IPRATROPIUM-ALBUTEROL 0.5-2.5 (3) MG/3ML IN SOLN: 3.0000 mL | Freq: Two times a day (BID) | RESPIRATORY_TRACT | Status: DC

## 2015-05-25 MED ORDER — LEVOFLOXACIN IN D5W 750 MG/150ML IV SOLN
750.0000 mg | INTRAVENOUS | Status: DC
  Administered 2015-05-26: 750 mg via INTRAVENOUS
  Filled 2015-05-25: qty 150

## 2015-05-25 MED ORDER — IPRATROPIUM BROMIDE 0.02 % IN SOLN
0.5000 mg | Freq: Four times a day (QID) | RESPIRATORY_TRACT | Status: DC
  Administered 2015-05-25 – 2015-05-27 (×6): 0.5 mg via RESPIRATORY_TRACT
  Filled 2015-05-25 (×6): qty 2.5

## 2015-05-25 NOTE — Progress Notes (Signed)
Patient calling out in respiratory distress this afternoon.  Upon assessment patient without nasal cannula on, replaced, very short of breath with respirations in 30-40's and using accessory muscles.  Oxygen saturations initially 86% room air then up to mid 90's on nasal cannula.  Dr. Tyrell Antonio paged and notified, orders received. Respiratory therapy called and up to assist with patient.  In the middle of working with patient, patient states she is a "Do Not Resuscitate".  Confirmed with patient she understood what that means.  Patient at the time alert and oriented to person, place, and time.  Dr. Tyrell Antonio up to see patient.  Sanda Linger

## 2015-05-25 NOTE — Progress Notes (Addendum)
[facility] PROGRESS NOTE  [name] AVW:[id number] DOB: Aug 08, 1931 DOA: 05/24/2015 PCP: [name] Cobble, MD  Assessment/Plan: 1-Acute Respiratory Failure; likely related to PNA.  Continue with Levaquin.  HIV negative, legionella and strep antigen pending.  Influenza negative,.   Addendum:  Patient with worsening dyspnea, hypoxemia. Patient does not want to be resuscitated. She does not want to be on life support.  Son is aware of patient wishes. Jake Samples, RN was present during conversation with patient.  Stat Chest x ray. ABG.  Will give IV lasix.  BIPAP PRN.  If medical treatment fail,   could use morphine PRN for increase work of breathing and resp distress. .  Nebulizer treatments.  Will follow, might need Cardizem Gtt.  Uncontrolled type 2 diabetes mellitus with insulin therapy Will change 70/30 to lantus.  Continue with SSI.   AKI;  Hold lasix. Hold IV fluids to avoid volume overload.  Repeat labs in am.    HTN  -Continue home medications   Atrial flutter -Continue home medications for rate control -Continue eliquis for anticoagulation -HR elevated , will increase coreg.    Chronic diastolic heart failure -Compensated -Holding Lasix. Will order PRN as needed for increase work of breathing. Will hold lasix at this time due to increase cr,.  -weight 83 kg.   Hyperthyroidism -Continue Tapazole -If develop significant tachycardia will need to check TSH; no signs of thyroid storm at this juncture  OSA on CPAP -Continue nocturnal CPAP using home supplies Code Status: DNR.  Family Communication: care discussed with patient Disposition Plan: home probably at time of discharge. Continue with treatment of PNA   Consultants:  none  Procedures:  none  Antibiotics:  Levaquin.   HPI/Subjective: She is feeling better today. Breathing better.   Objective: Filed Vitals:   05/25/15 0622  BP: 150/55  Pulse: 111  Temp: 98.9 F (37.2 C)  Resp:  24   No intake or output data in the 24 hours ending 05/25/15 0829 Filed Weights   05/25/15 0622  Weight: 83.3 kg (183 lb 10.3 oz)    Exam:   General:  Alert in no distress.   Cardiovascular: S 1, S 2 RRR  Respiratory: few ronchus , crackles bases.   Abdomen: BS present, soft, nt  Musculoskeletal: no edema.    Data Reviewed: Basic Metabolic Panel:  Recent Labs Lab 05/23/15 1457 05/24/15 1120 05/25/15 0344  NA 130* 131* 135  K 4.4 4.4 4.0  CL 94* 92* 99*  CO2 28 26 26   GLUCOSE 494* 482* 327*  BUN 20 23* 24*  CREATININE 1.20 1.50* 1.46*  CALCIUM 9.1 9.0 8.6*   Liver Function Tests: No results for input(s): AST, ALT, ALKPHOS, BILITOT, PROT, ALBUMIN in the last 168 hours. No results for input(s): LIPASE, AMYLASE in the last 168 hours. No results for input(s): AMMONIA in the last 168 hours. CBC:  Recent Labs Lab 05/23/15 1457 05/24/15 1120 05/25/15 0344  WBC 14.4* 13.0* 14.4*  NEUTROABS 13.4* 12.0* 13.6*  HGB 13.7 13.6 12.5  HCT 42.0 41.8 38.8  MCV 88.7 89.1 88.8  PLT 136.0* 136* 129*   Cardiac Enzymes:  Recent Labs Lab 05/23/15 1457  CKTOTAL 779*   BNP (last 3 results)  Recent Labs  04/10/15 1305 05/24/15 1120  BNP 165.4* 400.8*    ProBNP (last 3 results) No results for input(s): PROBNP in the last 8760 hours.  CBG:  Recent Labs Lab 05/24/15 1621 05/24/15 1712 05/24/15 1820 05/24/15 2119 05/25/15 0710  GLUCAP 367* 383*  397* 336* 339*    No results found for this or any previous visit (from the past 240 hour(s)).   Studies: Dg Chest 2 View  05/24/2015   CLINICAL DATA:  Dizziness, weakness, inability to walk, high blood sugarHx of congestion heart failure, DM, HTN, CAD, myocardial infarction  EXAM: CHEST  2 VIEW  COMPARISON:  05/23/2015  FINDINGS: Cardiac silhouette borderline enlarged but normal in configuration. No mediastinal or hilar masses or convincing adenopathy.  Lungs are clear.  No pleural effusion or pneumothorax.  Bony  thorax is intact.  IMPRESSION: No acute cardiopulmonary disease.   Electronically Signed   By: Lajean Manes M.D.   On: 05/24/2015 11:40   Dg Chest 2 View  05/23/2015   CLINICAL DATA:  Shortness of breath.  EXAM: CHEST  2 VIEW  COMPARISON:  05/21/2014.  FINDINGS: Mediastinum and hilar structures normal. Cardiomegaly with bilateral pulmonary interstitial prominence consistent with congestive heart failure. Pneumonitis cannot be excluded. No pleural effusion or pneumothorax. Diffuse osteopenia with multiple mild stable thoracic spine compression fractures.  IMPRESSION: Cardiomegaly with bilateral pulmonary interstitial prominence consistent with congestive heart failure. Bilateral pneumonitis cannot be excluded.   Electronically Signed   By: Marcello Moores  Register   On: 05/23/2015 15:23   Ct Chest Wo Contrast  05/24/2015   CLINICAL DATA:  Shortness of breath and generalized weakness.  EXAM: CT CHEST WITHOUT CONTRAST  TECHNIQUE: Multidetector CT imaging of the chest was performed following the standard protocol without IV contrast.  COMPARISON:  Chest x-ray today as well as CT 12/24/2008  FINDINGS: Lungs are adequately inflated and demonstrate moderate airspace consolidation over the left lower lobe likely a pneumonia. Subtle linear atelectasis/scarring over the right lower lobe. Mild centrilobular emphysematous disease over the mid to upper lungs. Airways are within normal.  Mild prominence of the heart. There is a tiny amount of pericardial fluid present. Evidence of 3 vessel atherosclerotic coronary artery disease. No significant mediastinal, hilar or axillary adenopathy. Moderate calcified plaque over the thoracic aorta. Fluid within the distal esophagus which may be due to reflux versus dysmotility.  Images through the upper abdomen demonstrate focal calcification over the dome of the liver and right lobe the liver unchanged. There are degenerative changes of the spine.  IMPRESSION: Moderate airspace consolidation  over the left lower lobe compatible with a pneumonia. Recommend follow-up chest radiograph in 4 weeks to document resolution.  Borderline cardiomegaly with 3 vessel atherosclerotic coronary artery disease. Tiny amount of pericardial fluid.  Fluid in the distal esophagus likely due to dysmotility or reflux.   Electronically Signed   By: Marin Olp M.D.   On: 05/24/2015 13:40    Scheduled Meds: . apixaban  5 mg Oral BID  . carvedilol  12.5 mg Oral BID WC  . insulin aspart  0-15 Units Subcutaneous TID WC  . insulin aspart  0-5 Units Subcutaneous QHS  . insulin aspart protamine- aspart  30 Units Subcutaneous Q breakfast  . ipratropium-albuterol  3 mL Nebulization Q4H  . [START ON 05/26/2015] levofloxacin (LEVAQUIN) IV  750 mg Intravenous Q48H  . [START ON 05/26/2015] methimazole  5 mg Oral Once per day on Mon Wed Fri  . rosuvastatin  20 mg Oral Daily   Continuous Infusions: . sodium chloride 75 mL/hr (05/24/15 1637)    Principal Problem:   Acute respiratory failure with hypoxia Active Problems:   Uncontrolled type 2 diabetes mellitus with insulin therapy   HYPERLIPIDEMIA   Iron deficiency anemia   HTN (hypertension)  Atrial flutter   Hyperthyroidism   Pulmonary HTN   Chronic diastolic heart failure   OSA on CPAP   CAP (community acquired pneumonia)   Dehydration   Abnormal urinalysis   Sepsis    Time spent: 35 minutes.     Niel Hummer A  [facility] Pager [phone]. If 7PM-7AM, please contact night-coverage at [email or web address], password [facility] 05/25/2015, 8:29 AM  LOS: 1 day

## 2015-05-25 NOTE — Procedures (Signed)
Pt placed on home CPAP machine set 15-5cmH2O. RT added 2L of oxygen into the circuit. [employer] called to inspect machine.  Pt is resting comfortably at this time.

## 2015-05-25 NOTE — Progress Notes (Signed)
Patient refused CPAP tonight due to "feeling fine" Informed her to let the nurse know if at any time she changes her mind or feels like she wants to wear it tonight. RT will continue to monitor.

## 2015-05-26 DIAGNOSIS — J9601 Acute respiratory failure with hypoxia: Secondary | ICD-10-CM

## 2015-05-26 DIAGNOSIS — I5032 Chronic diastolic (congestive) heart failure: Secondary | ICD-10-CM

## 2015-05-26 LAB — CBC
HCT: 40.6 % (ref 36.0–46.0)
Hemoglobin: 13 g/dL (ref 12.0–15.0)
MCH: 28.6 pg (ref 26.0–34.0)
MCHC: 32 g/dL (ref 30.0–36.0)
MCV: 89.4 fL (ref 78.0–100.0)
Platelets: 130 10*3/uL — ABNORMAL LOW (ref 150–400)
RBC: 4.54 MIL/uL (ref 3.87–5.11)
RDW: 16.7 % — AB (ref 11.5–15.5)
WBC: 13 10*3/uL — AB (ref 4.0–10.5)

## 2015-05-26 LAB — BASIC METABOLIC PANEL
Anion gap: 12 (ref 5–15)
BUN: 34 mg/dL — ABNORMAL HIGH (ref 6–20)
CHLORIDE: 96 mmol/L — AB (ref 101–111)
CO2: 29 mmol/L (ref 22–32)
CREATININE: 1.88 mg/dL — AB (ref 0.44–1.00)
Calcium: 8.8 mg/dL — ABNORMAL LOW (ref 8.9–10.3)
GFR calc Af Amer: 28 mL/min — ABNORMAL LOW (ref >60)
GFR, EST NON AFRICAN AMERICAN: 24 mL/min — AB (ref >60)
Glucose, Bld: 288 mg/dL — ABNORMAL HIGH (ref 65–99)
POTASSIUM: 4.6 mmol/L (ref 3.5–5.1)
Sodium: 137 mmol/L (ref 135–145)

## 2015-05-26 LAB — HEPATIC FUNCTION PANEL
ALT: 72 U/L — ABNORMAL HIGH (ref 14–54)
AST: 159 U/L — AB (ref 15–41)
Albumin: 2.3 g/dL — ABNORMAL LOW (ref 3.5–5.0)
Alkaline Phosphatase: 58 U/L (ref 38–126)
BILIRUBIN DIRECT: 0.3 mg/dL (ref 0.1–0.5)
BILIRUBIN INDIRECT: 0.6 mg/dL (ref 0.3–0.9)
BILIRUBIN TOTAL: 0.9 mg/dL (ref 0.3–1.2)
Total Protein: 7.1 g/dL (ref 6.5–8.1)

## 2015-05-26 LAB — GLUCOSE, CAPILLARY
GLUCOSE-CAPILLARY: 309 mg/dL — AB (ref 65–99)
GLUCOSE-CAPILLARY: 334 mg/dL — AB (ref 65–99)
GLUCOSE-CAPILLARY: 199 mg/dL — AB (ref 65–99)
Glucose-Capillary: 355 mg/dL — ABNORMAL HIGH (ref 65–99)

## 2015-05-26 LAB — URINE CULTURE: Colony Count: >=100000

## 2015-05-26 LAB — LACTIC ACID, PLASMA: Lactic Acid, Venous: 2.3 mmol/L (ref 0.5–2.0)

## 2015-05-26 MED ORDER — APIXABAN 2.5 MG PO TABS
2.5000 mg | ORAL_TABLET | Freq: Two times a day (BID) | ORAL | Status: DC
  Administered 2015-05-26 – 2015-05-31 (×11): 2.5 mg via ORAL
  Filled 2015-05-26 (×11): qty 1

## 2015-05-26 NOTE — Progress Notes (Signed)
[facility] PROGRESS NOTE  [name] JQB:[id number] DOB: 1931/11/06 DOA: 05/24/2015 PCP: [name], MD  Assessment/Plan: 1-Acute Respiratory Failure; likely related to PNA.  Continue with Levaquin.  HIV negative, legionella and strep antigen pending.  Influenza negative,.  Received IV lasix yesterday due to resp distress.  BIPAP PRN.  Nebulizer treatments.    Acute on Chronic diastolic heart failure -received IV lasix yesterday due to increase work of breathing.  -cr increase at 1.8 -weight 83 kg---82 -cardiology consulted to help with management.   Uncontrolled type 2 diabetes mellitus with insulin therapy changed 70/30 to lantus.  Continue with SSI.   AKI;  Hold lasix. Hold IV fluids to avoid volume overload.  Repeat labs in am.    HTN  -Continue home medications   Atrial flutter -Continue home medications for rate control -Continue eliquis for anticoagulation -HR elevated ,  Increased coreg 5-29  UTI; urine growing E coli. Continue with levaquin  Hyperthyroidism -Continue Tapazole -If develop significant tachycardia will need to check TSH; no signs of thyroid storm at this juncture  OSA on CPAP -Continue nocturnal CPAP using home supplies  Code Status: DNR.  Family Communication: care discussed with patient and son 5-29 Disposition Plan: home probably at time of discharge. Continue with treatment of PNA   Consultants:  Cardiology  Procedures:  none  Antibiotics:  Levaquin.   HPI/Subjective: Feeling better today, breathing better  Objective: Filed Vitals:   05/26/15 0416  BP: 143/68  Pulse: 106  Temp: 98.4 F (36.9 C)  Resp: 22    Intake/Output Summary (Last 24 hours) at 05/26/15 1209 Last data filed at 05/26/15 1142  Gross per 24 hour  Intake    480 ml  Output    475 ml  Net      5 ml   Filed Weights   05/25/15 0622  Weight: 83.3 kg (183 lb 10.3 oz)    Exam:   General:  Alert in no distress.    Cardiovascular: S 1, S 2 RRR  Respiratory: few ronchus , crackles bases.   Abdomen: BS present, soft, nt  Musculoskeletal: no edema.    Data Reviewed: Basic Metabolic Panel:  Recent Labs Lab 05/23/15 1457 05/24/15 1120 05/25/15 0344 05/26/15 0323  NA 130* 131* 135 137  K 4.4 4.4 4.0 4.6  CL 94* 92* 99* 96*  CO2 28 26 26 29   GLUCOSE 494* 482* 327* 288*  BUN 20 23* 24* 34*  CREATININE 1.20 1.50* 1.46* 1.88*  CALCIUM 9.1 9.0 8.6* 8.8*   Liver Function Tests:  Recent Labs Lab 05/26/15 0323  AST 159*  ALT 72*  ALKPHOS 58  BILITOT 0.9  PROT 7.1  ALBUMIN 2.3*   No results for input(s): LIPASE, AMYLASE in the last 168 hours. No results for input(s): AMMONIA in the last 168 hours. CBC:  Recent Labs Lab 05/23/15 1457 05/24/15 1120 05/25/15 0344 05/26/15 0323  WBC 14.4* 13.0* 14.4* 13.0*  NEUTROABS 13.4* 12.0* 13.6*  --   HGB 13.7 13.6 12.5 13.0  HCT 42.0 41.8 38.8 40.6  MCV 88.7 89.1 88.8 89.4  PLT 136.0* 136* 129* 130*   Cardiac Enzymes:  Recent Labs Lab 05/23/15 1457  CKTOTAL 779*   BNP (last 3 results)  Recent Labs  04/10/15 1305 05/24/15 1120  BNP 165.4* 400.8*    ProBNP (last 3 results) No results for input(s): PROBNP in the last 8760 hours.  CBG:  Recent Labs Lab 05/25/15 1103 05/25/15 1518 05/25/15 1640 05/25/15 2222 05/26/15 0830  GLUCAP 446* 310* 269* 255* 309*    Recent Results (from the past 240 hour(s))  Urine culture     Status: None (Preliminary result)   Collection Time: 05/24/15 12:38 PM  Result Value Ref Range Status   Specimen Description URINE, RANDOM  Final   Special Requests NONE  Final   Colony Count   Final    >=100,000 COLONIES/ML Performed at [facility]    Culture   Final    ESCHERICHIA COLI Performed at [facility]    Report Status PENDING  Incomplete  Culture, blood (routine x 2) Call MD if unable to obtain prior to antibiotics being given     Status: None (Preliminary result)    Collection Time: 05/24/15  4:29 PM  Result Value Ref Range Status   Specimen Description BLOOD LEFT ARM  Final   Special Requests BOTTLES DRAWN AEROBIC ONLY 10CC  Final   Culture   Final           BLOOD CULTURE RECEIVED NO GROWTH TO DATE CULTURE WILL BE HELD FOR 5 DAYS BEFORE ISSUING A FINAL NEGATIVE REPORT Performed at [facility]    Report Status PENDING  Incomplete  Culture, blood (routine x 2) Call MD if unable to obtain prior to antibiotics being given     Status: None (Preliminary result)   Collection Time: 05/24/15  4:33 PM  Result Value Ref Range Status   Specimen Description BLOOD LEFT HAND  Final   Special Requests BOTTLES DRAWN AEROBIC ONLY 10CC  Final   Culture   Final           BLOOD CULTURE RECEIVED NO GROWTH TO DATE CULTURE WILL BE HELD FOR 5 DAYS BEFORE ISSUING A FINAL NEGATIVE REPORT Performed at [facility]    Report Status PENDING  Incomplete     Studies: Ct Chest Wo Contrast  05/24/2015   CLINICAL DATA:  Shortness of breath and generalized weakness.  EXAM: CT CHEST WITHOUT CONTRAST  TECHNIQUE: Multidetector CT imaging of the chest was performed following the standard protocol without IV contrast.  COMPARISON:  Chest x-ray today as well as CT 12/24/2008  FINDINGS: Lungs are adequately inflated and demonstrate moderate airspace consolidation over the left lower lobe likely a pneumonia. Subtle linear atelectasis/scarring over the right lower lobe. Mild centrilobular emphysematous disease over the mid to upper lungs. Airways are within normal.  Mild prominence of the heart. There is a tiny amount of pericardial fluid present. Evidence of 3 vessel atherosclerotic coronary artery disease. No significant mediastinal, hilar or axillary adenopathy. Moderate calcified plaque over the thoracic aorta. Fluid within the distal esophagus which may be due to reflux versus dysmotility.  Images through the upper abdomen demonstrate focal calcification over the dome of  the liver and right lobe the liver unchanged. There are degenerative changes of the spine.  IMPRESSION: Moderate airspace consolidation over the left lower lobe compatible with a pneumonia. Recommend follow-up chest radiograph in 4 weeks to document resolution.  Borderline cardiomegaly with 3 vessel atherosclerotic coronary artery disease. Tiny amount of pericardial fluid.  Fluid in the distal esophagus likely due to dysmotility or reflux.   Electronically Signed   By: Marin Olp M.D.   On: 05/24/2015 13:40   Dg Chest Port 1 View  05/25/2015   CLINICAL DATA:  Wheezing, shortness of breath  EXAM: PORTABLE CHEST - 1 VIEW  COMPARISON:  Chest CT 05/24/2015, chest radiograph 05/24/2015  FINDINGS: The heart size at upper limits of  normal. Lungs are hypoaerated with crowding of the bronchovascular markings. No focal pulmonary opacity. The visualized skeletal structures are unremarkable.  IMPRESSION: Low volume exam without focal acute finding. If symptoms persist, consider PA and lateral chest radiographs obtained at full inspiration when the patient is clinically able.   Electronically Signed   By: Conchita Paris M.D.   On: 05/25/2015 16:06    Scheduled Meds: . apixaban  2.5 mg Oral BID  . carvedilol  25 mg Oral BID WC  . insulin aspart  0-15 Units Subcutaneous TID WC  . insulin aspart  0-5 Units Subcutaneous QHS  . insulin glargine  25 Units Subcutaneous Daily  . ipratropium  0.5 mg Nebulization Q6H  . levalbuterol  0.63 mg Nebulization Q6H  . levofloxacin (LEVAQUIN) IV  750 mg Intravenous Q48H  . methimazole  5 mg Oral Once per day on Mon Wed Fri  . rosuvastatin  20 mg Oral Daily   Continuous Infusions:    Principal Problem:   Acute respiratory failure with hypoxia Active Problems:   Uncontrolled type 2 diabetes mellitus with insulin therapy   HYPERLIPIDEMIA   Iron deficiency anemia   HTN (hypertension)   Atrial flutter   Hyperthyroidism   Pulmonary HTN   Chronic diastolic heart  failure   OSA on CPAP   CAP (community acquired pneumonia)   Dehydration   Abnormal urinalysis   Sepsis    Time spent: 35 minutes.     Niel Hummer A  [facility] Pager [phone]. If 7PM-7AM, please contact night-coverage at [email or web address], password [facility] 05/26/2015, 12:09 PM  LOS: 2 days

## 2015-05-26 NOTE — Progress Notes (Signed)
Placed patient on home CPAP on Auto Titrate of 5-15cmH2O with 5L of O2 bled in.  Rt will continue to monitor.

## 2015-05-26 NOTE — Consult Note (Signed)
CARDIOLOGY CONSULT NOTE   Patient ID: [name] MRN: [id number], DOB/AGE: 1931/02/01   Admit date: 05/24/2015 Date of Consult: 05/26/2015   Primary Physician: [name], MD Primary Cardiologist: Dr. Cristopher Peru; Dr. Linna Hoff Bensimhon  Pt. Profile  81 year old woman admitted with acute respiratory failure likely related to pneumonia.  She has a past history of chronic diastolic heart failure and is also followed in the heart failure clinic.  Problem List  Past Medical History  Diagnosis Date  . Hypopotassemia     PMH of  . Anemia     iron defficiency  . Type II or unspecified type diabetes mellitus without mention of complication, uncontrolled     Dr Loanne Drilling  . Muscle pain   . Degenerative joint disease   . Atrial fibrillation or flutter     s/p RFCA 7/08;   s/p DCCV in past;   previously on amiodarone;  amio stopped due to lung toxicity  . CAD (coronary artery disease)     s/p NSTEMI tx with BMS to OM1 3/08;  cath 3/08: pOM 99% tx with PCI, pLAD 20%, ? mod stenosis at the AM  . HLD (hyperlipidemia)   . HTN (hypertension)     essential nos  . Dystrophy, corneal stromal   . Chronic diastolic heart failure     echo 11/11:  EF 55-60%, severe LVH, mod LAE, mild MR, mildly increased PASP  . Allergy     seasonal  . Cataract   . CHF (congestive heart failure)   . Myocardial infarction 2008  . Osteoporosis   . Thyroid disease     hypothyroidism    Past Surgical History  Procedure Laterality Date  . Cholecystectomy    . Knee surgery    . Eye surgery      cataract surgery & corneal implant bilaterally  . A flutter ablation      Dr Lovena Le  . Gravid 2 para 2    . Colonoscopy  6/07    2 polyps Dr. Kinnie Feil in [place]  . Bare metal stent  02/2007    Dr Lovena Le  . Colonoscopy with polypectomy       X 2; [facility]  . Left and right heart catheterization with coronary angiogram N/A 05/07/2014    Procedure: LEFT AND RIGHT HEART CATHETERIZATION WITH CORONARY ANGIOGRAM;   Surgeon: Peter M Martinique, MD;  Location: [facility] CATH LAB;  Service: Cardiovascular;  Laterality: N/A;     Allergies  Allergies  Allergen Reactions  . Amlodipine Besylate Other (See Comments)    REACTION: tingling in lips & gum edema  . Lobster [Shellfish Allergy] Other (See Comments)    angioedema  . Penicillins Rash  . Valsartan Other (See Comments)    REACTION: angioedema  . Codeine Other (See Comments)    Mental status changes  . Tramadol Hcl Nausea And Vomiting    HPI   This pleasant 81 year old woman was admitted on 05/24/15 for acute respiratory insufficiency presenting as dyspnea most likely related to aspiration pneumonia, as her symptoms started after an episode of vomiting on Thursday, May 26.  The patient has a history of chronic atrial fibrillation and has been on long-term Apixaban for anticoagulation.  She had an echocardiogram on 03/04/15 showed normal systolic function with an ejection fraction of 60-65%.  There was mild mitral regurgitation.  There was aortic valve sclerosis without stenosis.  The patient has a history of stable coronary artery disease and underwent right and left heart cardiac catheterization  in May 2015 which showed pulmonary hypertension with pulmonary artery pressure of 63 and no significant obstructive coronary disease.  Left ventricular ejection fraction was 55-65% at catheter without significant mitral regurgitation.  She was last seen in the heart failure clinic on 04/10/15 at which time she noted to be doing well.  At that time she was cut back on her Lasix to just 80 mg a day with emphasis on dietary restriction.  Her carvedilol was reduced to 12.5 mg twice a day and she was continued on long-term Apixaban. Since admission the patient has had significant dyspnea.  Her Lasix was initially held.  However yesterday she was more dyspneic and was given Lasix and today she states that she feels considerably better.  He denies any chest pain.  She denies any chills  or fever.  She is not coughing up any sputum.  An  x-ray portable yesterday showed no definite infiltrate and heart size was upper normal and no definite radiographic evidence of CHF Inpatient Medications  . apixaban  2.5 mg Oral BID  . carvedilol  25 mg Oral BID WC  . insulin aspart  0-15 Units Subcutaneous TID WC  . insulin aspart  0-5 Units Subcutaneous QHS  . insulin glargine  25 Units Subcutaneous Daily  . ipratropium  0.5 mg Nebulization Q6H  . levalbuterol  0.63 mg Nebulization Q6H  . levofloxacin (LEVAQUIN) IV  750 mg Intravenous Q48H  . methimazole  5 mg Oral Once per day on Mon Wed Fri  . rosuvastatin  20 mg Oral Daily    Family History Family History  Problem Relation Age of Onset  . Diabetes Mother   . Hypertension Mother   . Transient ischemic attack Mother   . Heart attack Father 50  . Arthritis Father   . Breast cancer Maternal Aunt   . Arthritis Maternal Aunt   . Arthritis Mother   . Hypertension Father      Social History History   Social History  . Marital Status: Widowed    Spouse Name: N/A  . Number of Children: N/A  . Years of Education: N/A   Occupational History  . Teacher    Social History Main Topics  . Smoking status: Former Smoker -- 0.25 packs/day for 18 years    Types: Cigarettes    Quit date: 12/27/1968  . Smokeless tobacco: Never Used     Comment: smoked [place], up to 1 pp week  . Alcohol Use: No  . Drug Use: No  . Sexual Activity: Not on file   Other Topics Concern  . Not on file   Social History Narrative   Teacher. Widowed. Rarely drinks cafeine.      Review of Systems  General:  No chills, fever, night sweats or weight changes.  Cardiovascular:  No chest pain, dyspnea on exertion, edema, orthopnea, palpitations, paroxysmal nocturnal dyspnea. Dermatological: No rash, lesions/masses Respiratory: Positive for recent cough and shortness of breath Urologic: No hematuria, dysuria Abdominal:   No nausea, vomiting,  diarrhea, bright red blood per rectum, melena, or hematemesis Neurologic:  No visual changes, wkns, changes in mental status. All other systems reviewed and are otherwise negative except as noted above.  Physical Exam  Blood pressure 143/68, pulse 106, temperature 98.4 F (36.9 C), temperature source Oral, resp. rate 22, weight 183 lb 10.3 oz (83.3 kg), SpO2 96 %.  General: Pleasant, NAD.  Nasal oxygen in place Psych: Normal affect. Neuro: Alert and oriented X 3. Moves all extremities  spontaneously. HEENT: Normal  Neck: Supple without bruits or JVD. Lungs:  Resp regular and unlabored.  There are mild inspiratory rales at both bases Heart: Irregular pulse.  No murmur gallop or rub Abdomen: Soft, non-tender, non-distended, BS + x 4.  Extremities: No clubbing, cyanosis or edema. DP/PT/Radials 2+ and equal bilaterally.  Labs   Recent Labs  05/23/15 1457  CKTOTAL 779*   Lab Results  Component Value Date   WBC 13.0* 05/26/2015   HGB 13.0 05/26/2015   HCT 40.6 05/26/2015   MCV 89.4 05/26/2015   PLT 130* 05/26/2015     Recent Labs Lab 05/26/15 0323  NA 137  K 4.6  CL 96*  CO2 29  BUN 34*  CREATININE 1.88*  CALCIUM 8.8*  GLUCOSE 288*   Lab Results  Component Value Date   CHOL 219* 08/21/2014   HDL 53.90 08/21/2014   LDLCALC 146* 08/21/2014   TRIG 95.0 08/21/2014   Lab Results  Component Value Date   DDIMER * 12/24/2008    1.26        AT THE INHOUSE ESTABLISHED CUTOFF VALUE OF 0.48 ug/mL FEU, THIS ASSAY HAS BEEN DOCUMENTED IN THE LITERATURE TO HAVE    Radiology/Studies  Dg Chest 2 View  05/24/2015   CLINICAL DATA:  Dizziness, weakness, inability to walk, high blood sugarHx of congestion heart failure, DM, HTN, CAD, myocardial infarction  EXAM: CHEST  2 VIEW  COMPARISON:  05/23/2015  FINDINGS: Cardiac silhouette borderline enlarged but normal in configuration. No mediastinal or hilar masses or convincing adenopathy.  Lungs are clear.  No pleural effusion or  pneumothorax.  Bony thorax is intact.  IMPRESSION: No acute cardiopulmonary disease.   Electronically Signed   By: Lajean Manes M.D.   On: 05/24/2015 11:40   Dg Chest 2 View  05/23/2015   CLINICAL DATA:  Shortness of breath.  EXAM: CHEST  2 VIEW  COMPARISON:  05/21/2014.  FINDINGS: Mediastinum and hilar structures normal. Cardiomegaly with bilateral pulmonary interstitial prominence consistent with congestive heart failure. Pneumonitis cannot be excluded. No pleural effusion or pneumothorax. Diffuse osteopenia with multiple mild stable thoracic spine compression fractures.  IMPRESSION: Cardiomegaly with bilateral pulmonary interstitial prominence consistent with congestive heart failure. Bilateral pneumonitis cannot be excluded.   Electronically Signed   By: Marcello Moores  Register   On: 05/23/2015 15:23   Ct Chest Wo Contrast  05/24/2015   CLINICAL DATA:  Shortness of breath and generalized weakness.  EXAM: CT CHEST WITHOUT CONTRAST  TECHNIQUE: Multidetector CT imaging of the chest was performed following the standard protocol without IV contrast.  COMPARISON:  Chest x-ray today as well as CT 12/24/2008  FINDINGS: Lungs are adequately inflated and demonstrate moderate airspace consolidation over the left lower lobe likely a pneumonia. Subtle linear atelectasis/scarring over the right lower lobe. Mild centrilobular emphysematous disease over the mid to upper lungs. Airways are within normal.  Mild prominence of the heart. There is a tiny amount of pericardial fluid present. Evidence of 3 vessel atherosclerotic coronary artery disease. No significant mediastinal, hilar or axillary adenopathy. Moderate calcified plaque over the thoracic aorta. Fluid within the distal esophagus which may be due to reflux versus dysmotility.  Images through the upper abdomen demonstrate focal calcification over the dome of the liver and right lobe the liver unchanged. There are degenerative changes of the spine.  IMPRESSION: Moderate  airspace consolidation over the left lower lobe compatible with a pneumonia. Recommend follow-up chest radiograph in 4 weeks to document resolution.  Borderline cardiomegaly with  3 vessel atherosclerotic coronary artery disease. Tiny amount of pericardial fluid.  Fluid in the distal esophagus likely due to dysmotility or reflux.   Electronically Signed   By: Marin Olp M.D.   On: 05/24/2015 13:40   Dg Chest Port 1 View  05/25/2015   CLINICAL DATA:  Wheezing, shortness of breath  EXAM: PORTABLE CHEST - 1 VIEW  COMPARISON:  Chest CT 05/24/2015, chest radiograph 05/24/2015  FINDINGS: The heart size at upper limits of normal. Lungs are hypoaerated with crowding of the bronchovascular markings. No focal pulmonary opacity. The visualized skeletal structures are unremarkable.  IMPRESSION: Low volume exam without focal acute finding. If symptoms persist, consider PA and lateral chest radiographs obtained at full inspiration when the patient is clinically able.   Electronically Signed   By: Conchita Paris M.D.   On: 05/25/2015 16:06    ECG  24-May-2015 10:58:34 [facility]/ED ROUTINE RECORD Atrial flutter with variable A-V block Left axis deviation Incomplete right bundle branch block Left ventricular hypertrophy with repolarization abnormality Abnormal ECG No significant change since last tracing Confirmed by DOCHERTY MD, MEGAN ([id number]) on 05/24/2015 11:16:24 AM Atrial flutter fibrillation.  Personally reviewed. ASSESSMENT AND PLAN  1.  Chronic atrial flutter fibrillation 2.  Acute on chronic diastolic heart failure 3.  Chronic kidney disease stage III 4.  Diabetes mellitus 5.  Stable coronary artery disease 6.  Dyslipidemia 7.  Resolving acute pneumonia, possibly aspiration pneumonia  Plan: Her creatinine bumped up to 1.88 today after receiving Lasix yesterday.  We will hold further Lasix today and reassess tomorrow.  Clinically she states that she is much improved  today.   Berna Spare MD  05/26/2015, 9:39 AM

## 2015-05-26 NOTE — Progress Notes (Signed)
ANTIBIOTIC CONSULT NOTE  Pharmacy Consult for Levaquin Indication: pneumonia  Allergies  Allergen Reactions  . Amlodipine Besylate Other (See Comments)    REACTION: tingling in lips & gum edema  . Lobster [Shellfish Allergy] Other (See Comments)    angioedema  . Penicillins Rash  . Valsartan Other (See Comments)    REACTION: angioedema  . Codeine Other (See Comments)    Mental status changes  . Tramadol Hcl Nausea And Vomiting    Patient Measurements: Weight 84.4 kg on 04/28/15 Height 160 cm on 04/28/15 Vital Signs: Temp: 98.4 F (36.9 C) (05/30 0416) Temp Source: Oral (05/30 0416) BP: 143/68 mmHg (05/30 0416) Pulse Rate: 106 (05/30 0416) Labs:  Recent Labs  05/24/15 1120 05/25/15 0344 05/26/15 0323  WBC 13.0* 14.4* 13.0*  HGB 13.6 12.5 13.0  PLT 136* 129* 130*  CREATININE 1.50* 1.46* 1.88*   Estimated Creatinine Clearance: 24 mL/min (by C-G formula based on Cr of 1.88). No results for input(s): VANCOTROUGH, VANCOPEAK, VANCORANDOM, GENTTROUGH, GENTPEAK, GENTRANDOM, TOBRATROUGH, TOBRAPEAK, TOBRARND, AMIKACINPEAK, AMIKACINTROU, AMIKACIN in the last 72 hours.   Microbiology: Recent Results (from the past 720 hour(s))  Urine culture     Status: None (Preliminary result)   Collection Time: 05/24/15 12:38 PM  Result Value Ref Range Status   Specimen Description URINE, RANDOM  Final   Special Requests NONE  Final   Colony Count   Final    >=100,000 COLONIES/ML Performed at [facility]    Culture   Final    ESCHERICHIA COLI Performed at [facility]    Report Status PENDING  Incomplete  Culture, blood (routine x 2) Call MD if unable to obtain prior to antibiotics being given     Status: None (Preliminary result)   Collection Time: 05/24/15  4:29 PM  Result Value Ref Range Status   Specimen Description BLOOD LEFT ARM  Final   Special Requests BOTTLES DRAWN AEROBIC ONLY 10CC  Final   Culture   Final           BLOOD CULTURE RECEIVED NO GROWTH TO DATE  CULTURE WILL BE HELD FOR 5 DAYS BEFORE ISSUING A FINAL NEGATIVE REPORT Performed at [facility]    Report Status PENDING  Incomplete  Culture, blood (routine x 2) Call MD if unable to obtain prior to antibiotics being given     Status: None (Preliminary result)   Collection Time: 05/24/15  4:33 PM  Result Value Ref Range Status   Specimen Description BLOOD LEFT HAND  Final   Special Requests BOTTLES DRAWN AEROBIC ONLY 10CC  Final   Culture   Final           BLOOD CULTURE RECEIVED NO GROWTH TO DATE CULTURE WILL BE HELD FOR 5 DAYS BEFORE ISSUING A FINAL NEGATIVE REPORT Performed at [facility]    Report Status PENDING  Incomplete    Medical History: Past Medical History  Diagnosis Date  . Hypopotassemia     PMH of  . Anemia     iron defficiency  . Type II or unspecified type diabetes mellitus without mention of complication, uncontrolled     Dr Loanne Drilling  . Muscle pain   . Degenerative joint disease   . Atrial fibrillation or flutter     s/p RFCA 7/08;   s/p DCCV in past;   previously on amiodarone;  amio stopped due to lung toxicity  . CAD (coronary artery disease)     s/p NSTEMI tx with BMS to OM1  3/08;  cath 3/08: pOM 99% tx with PCI, pLAD 20%, ? mod stenosis at the AM  . HLD (hyperlipidemia)   . HTN (hypertension)     essential nos  . Dystrophy, corneal stromal   . Chronic diastolic heart failure     echo 11/11:  EF 55-60%, severe LVH, mod LAE, mild MR, mildly increased PASP  . Allergy     seasonal  . Cataract   . CHF (congestive heart failure)   . Myocardial infarction 2008  . Osteoporosis   . Thyroid disease     hypothyroidism    Medications:  Anti-infectives    Start     Dose/Rate Route Frequency Ordered Stop   05/26/15 1630  levofloxacin (LEVAQUIN) IVPB 750 mg     750 mg 100 mL/hr over 90 Minutes Intravenous Every 48 hours 05/25/15 0653     05/24/15 1530  levofloxacin (LEVAQUIN) IVPB 750 mg  Status:  Discontinued     750 mg 100 mL/hr over  90 Minutes Intravenous Every 48 hours 05/24/15 1527 05/24/15 1850     Assessment: 81 year old female with r/o CAP to start Levaquin per pharmacy dosing.   WBC 13, SCr up to 1.88 >> estimated CrCl ~25 ml/min. Tmax 100.6 CT Chest- moderate consolidation in LLL Given age and scr trend do not foresee any further levaquin dose adjustments. Will follow up sensitivities but follow up peripherally for now.  Patient also receiving apixaban for afib. Was taking 5mg  bid pta. Given AKI and bump in scr today will reduce dose to 2.5mg  bid. (age>80, scr>1.5). I would continue with this dose until we see a significant improvement in scr.  Goal of Therapy:  Clinical resolution of infection  Plan:  Levaquin 750mg  IV q48h.  Decrease apixaban to 2.5mg  bid  Erin Hearing PharmD., BCPS Clinical Pharmacist Pager [phone] 05/26/2015 9:11 AM

## 2015-05-26 NOTE — Progress Notes (Signed)
Placed patient on BiPAP due to increased RR. Setting 10/5 and 40%  Rate is decreasing  RT will continue to monitor

## 2015-05-26 NOTE — Progress Notes (Signed)
RT placed pt on home CPAP machine via home ffm with 4Lpm of oxygen bled in. Pt states she is comfortable and is tolerating CPAP well at this time. RT will continue to monitor as needed.

## 2015-05-27 ENCOUNTER — Encounter (HOSPITAL_COMMUNITY): Payer: Self-pay | Admitting: General Practice

## 2015-05-27 DIAGNOSIS — J189 Pneumonia, unspecified organism: Secondary | ICD-10-CM

## 2015-05-27 DIAGNOSIS — I5043 Acute on chronic combined systolic (congestive) and diastolic (congestive) heart failure: Secondary | ICD-10-CM | POA: Insufficient documentation

## 2015-05-27 DIAGNOSIS — I5033 Acute on chronic diastolic (congestive) heart failure: Secondary | ICD-10-CM

## 2015-05-27 HISTORY — DX: Pneumonia, unspecified organism: J18.9

## 2015-05-27 LAB — GLUCOSE, CAPILLARY
GLUCOSE-CAPILLARY: 315 mg/dL — AB (ref 65–99)
Glucose-Capillary: 218 mg/dL — ABNORMAL HIGH (ref 65–99)
Glucose-Capillary: 271 mg/dL — ABNORMAL HIGH (ref 65–99)
Glucose-Capillary: 339 mg/dL — ABNORMAL HIGH (ref 65–99)

## 2015-05-27 LAB — CBC
HCT: 37.1 % (ref 36.0–46.0)
Hemoglobin: 11.9 g/dL — ABNORMAL LOW (ref 12.0–15.0)
MCH: 28.2 pg (ref 26.0–34.0)
MCHC: 32.1 g/dL (ref 30.0–36.0)
MCV: 87.9 fL (ref 78.0–100.0)
PLATELETS: 145 10*3/uL — AB (ref 150–400)
RBC: 4.22 MIL/uL (ref 3.87–5.11)
RDW: 16.7 % — AB (ref 11.5–15.5)
WBC: 11 10*3/uL — AB (ref 4.0–10.5)

## 2015-05-27 LAB — HEMOGLOBIN A1C
Hgb A1c MFr Bld: 7.9 % — ABNORMAL HIGH (ref 4.8–5.6)
Mean Plasma Glucose: 180 mg/dL

## 2015-05-27 LAB — BASIC METABOLIC PANEL
Anion gap: 10 (ref 5–15)
BUN: 40 mg/dL — ABNORMAL HIGH (ref 6–20)
CALCIUM: 8.5 mg/dL — AB (ref 8.9–10.3)
CO2: 26 mmol/L (ref 22–32)
Chloride: 96 mmol/L — ABNORMAL LOW (ref 101–111)
Creatinine, Ser: 1.69 mg/dL — ABNORMAL HIGH (ref 0.44–1.00)
GFR calc Af Amer: 32 mL/min — ABNORMAL LOW (ref >60)
GFR calc non Af Amer: 27 mL/min — ABNORMAL LOW (ref >60)
Glucose, Bld: 216 mg/dL — ABNORMAL HIGH (ref 65–99)
POTASSIUM: 4.3 mmol/L (ref 3.5–5.1)
Sodium: 132 mmol/L — ABNORMAL LOW (ref 135–145)

## 2015-05-27 MED ORDER — CETYLPYRIDINIUM CHLORIDE 0.05 % MT LIQD
7.0000 mL | Freq: Two times a day (BID) | OROMUCOSAL | Status: DC
  Administered 2015-05-27 – 2015-05-31 (×8): 7 mL via OROMUCOSAL

## 2015-05-27 MED ORDER — CLINDAMYCIN PHOSPHATE 600 MG/50ML IV SOLN
600.0000 mg | Freq: Three times a day (TID) | INTRAVENOUS | Status: AC
  Administered 2015-05-28 – 2015-05-30 (×9): 600 mg via INTRAVENOUS
  Filled 2015-05-27 (×13): qty 50

## 2015-05-27 MED ORDER — FAMOTIDINE IN NACL 20-0.9 MG/50ML-% IV SOLN
20.0000 mg | Freq: Two times a day (BID) | INTRAVENOUS | Status: DC
  Administered 2015-05-27 – 2015-05-29 (×5): 20 mg via INTRAVENOUS
  Filled 2015-05-27 (×5): qty 50

## 2015-05-27 MED ORDER — LEVALBUTEROL HCL 0.63 MG/3ML IN NEBU
0.6300 mg | INHALATION_SOLUTION | Freq: Three times a day (TID) | RESPIRATORY_TRACT | Status: DC
  Administered 2015-05-27 – 2015-05-31 (×12): 0.63 mg via RESPIRATORY_TRACT
  Filled 2015-05-27 (×13): qty 3

## 2015-05-27 MED ORDER — CARVEDILOL 12.5 MG PO TABS
12.5000 mg | ORAL_TABLET | Freq: Two times a day (BID) | ORAL | Status: DC
  Administered 2015-05-27 – 2015-05-31 (×9): 12.5 mg via ORAL
  Filled 2015-05-27 (×9): qty 1

## 2015-05-27 MED ORDER — METHYLPREDNISOLONE SODIUM SUCC 40 MG IJ SOLR
40.0000 mg | Freq: Four times a day (QID) | INTRAMUSCULAR | Status: DC
  Administered 2015-05-27 – 2015-05-28 (×4): 40 mg via INTRAVENOUS
  Filled 2015-05-27 (×4): qty 1

## 2015-05-27 MED ORDER — IPRATROPIUM BROMIDE 0.02 % IN SOLN
0.5000 mg | Freq: Three times a day (TID) | RESPIRATORY_TRACT | Status: DC
  Administered 2015-05-27 – 2015-05-31 (×12): 0.5 mg via RESPIRATORY_TRACT
  Filled 2015-05-27 (×13): qty 2.5

## 2015-05-27 MED ORDER — DIPHENHYDRAMINE HCL 50 MG/ML IJ SOLN
25.0000 mg | Freq: Four times a day (QID) | INTRAMUSCULAR | Status: DC | PRN
  Administered 2015-05-27: 25 mg via INTRAVENOUS
  Filled 2015-05-27: qty 1

## 2015-05-27 MED ORDER — DIPHENHYDRAMINE HCL 25 MG PO CAPS
25.0000 mg | ORAL_CAPSULE | Freq: Four times a day (QID) | ORAL | Status: DC | PRN
  Filled 2015-05-27: qty 1

## 2015-05-27 MED ORDER — FUROSEMIDE 10 MG/ML IJ SOLN
80.0000 mg | Freq: Two times a day (BID) | INTRAMUSCULAR | Status: DC
  Administered 2015-05-27 (×2): 80 mg via INTRAVENOUS
  Filled 2015-05-27 (×2): qty 8

## 2015-05-27 NOTE — Progress Notes (Signed)
Inpatient Diabetes Program Recommendations  AACE/ADA: New Consensus Statement on Inpatient Glycemic Control (2013)  Target Ranges:  Prepandial:   less than 140 mg/dL      Peak postprandial:   less than 180 mg/dL (1-2 hours)      Critically ill patients:  140 - 180 mg/dL   Results for ARIEON, SCALZO (MRN [id number]) as of 05/27/2015 08:45  Ref. Range 05/26/2015 08:30 05/26/2015 12:10 05/26/2015 16:42 05/26/2015 21:31 05/27/2015 07:15  Glucose-Capillary Latest Ref Range: 65-99 mg/dL 309 (H) 355 (H) 334 (H) 199 (H) 218 (H)   Reason for Visit: PNA/CHF  Diabetes history: DM 2 Outpatient Diabetes medications: 75/25 40 units QAM Current orders for Inpatient glycemic control: lantus 25 units QHS, Novolog 0-15 units TID, Novolog 0-5 units QHS  Inpatient Diabetes Program Recommendations Insulin - Basal: Patient takes 75/25 40 units at home. Basal equivalent is 30 units. Fasting glucose is over 200mg /dl. Please consider increasing basal insulin to Lantus 30 units QHS.  Noted IV solumedrol to be started 40 mg Q6hrs.  Thanks,  Tama Headings RN, MSN, [facility] Inpatient Diabetes Coordinator Team Pager [phone]

## 2015-05-27 NOTE — Progress Notes (Signed)
UR Completed Corene Resnick Graves-Bigelow, RN,BSN [phone]  

## 2015-05-27 NOTE — Progress Notes (Signed)
Advanced Heart Failure Rounding Note   Subjective:    Admitted with dyspnea volume overload. Diuresing with IV lasix but had creatinine bump so IV lasix was held. Weight up 5 pounds. I/O not accurate.  Complaining of throat pain and tongue swelling over the last 24 hours. Difficulty swallowing. SOB at rest.   Creatinine 1.88> 1.69     Objective:   Weight Range:  Vital Signs:   Temp:  [97.3 F (36.3 C)-99.2 F (37.3 C)] 98 F (36.7 C) (05/31 0716) Pulse Rate:  [88-108] 88 (05/31 0345) Resp:  [19-28] 28 (05/31 0345) BP: (115-134)/(54-64) 118/57 mmHg (05/31 0345) SpO2:  [86 %-96 %] 92 % (05/31 0345) FiO2 (%):  [40 %] 40 % (05/31 0222) Weight:  [186 lb 9.6 oz (84.641 kg)] 186 lb 9.6 oz (84.641 kg) (05/31 0526) Last BM Date: 05/24/15  Weight change: Filed Weights   05/25/15 0622 05/26/15 0620 05/27/15 0526  Weight: 183 lb 10.3 oz (83.3 kg) 181 lb 7 oz (82.3 kg) 186 lb 9.6 oz (84.641 kg)    Intake/Output:   Intake/Output Summary (Last 24 hours) at 05/27/15 0734 Last data filed at 05/26/15 2215  Gross per 24 hour  Intake    580 ml  Output    325 ml  Net    255 ml     Physical Exam: General:  Chronically ill appearing. Dyspneic at rest. On CPAP  HEENT: Tongue edema.  Neck: supple. JVP ~10 . Carotids 2+ bilat; no bruits. No lymphadenopathy or thryomegaly appreciated. Cor: PMI nondisplaced. Regular rate & rhythm. No rubs, gallops or murmurs. Lungs: Decreased in bases.  Abdomen: soft, nontender, nondistended. No hepatosplenomegaly. No bruits or masses. Good bowel sounds. Extremities: no cyanosis, clubbing, rash, edema Neuro: alert & orientedx3, cranial nerves grossly intact. moves all 4 extremities w/o difficulty. Affect pleasant  Telemetry: A fib   Labs: Basic Metabolic Panel:  Recent Labs Lab 05/23/15 1457 05/24/15 1120 05/25/15 0344 05/26/15 0323 05/27/15 0245  NA 130* 131* 135 137 132*  K 4.4 4.4 4.0 4.6 4.3  CL 94* 92* 99* 96* 96*  CO2 28 26 26 29 26    GLUCOSE 494* 482* 327* 288* 216*  BUN 20 23* 24* 34* 40*  CREATININE 1.20 1.50* 1.46* 1.88* 1.69*  CALCIUM 9.1 9.0 8.6* 8.8* 8.5*    Liver Function Tests:  Recent Labs Lab 05/26/15 0323  AST 159*  ALT 72*  ALKPHOS 58  BILITOT 0.9  PROT 7.1  ALBUMIN 2.3*   No results for input(s): LIPASE, AMYLASE in the last 168 hours. No results for input(s): AMMONIA in the last 168 hours.  CBC:  Recent Labs Lab 05/23/15 1457 05/24/15 1120 05/25/15 0344 05/26/15 0323 05/27/15 0245  WBC 14.4* 13.0* 14.4* 13.0* 11.0*  NEUTROABS 13.4* 12.0* 13.6*  --   --   HGB 13.7 13.6 12.5 13.0 11.9*  HCT 42.0 41.8 38.8 40.6 37.1  MCV 88.7 89.1 88.8 89.4 87.9  PLT 136.0* 136* 129* 130* 145*    Cardiac Enzymes:  Recent Labs Lab 05/23/15 1457  CKTOTAL 779*    BNP: BNP (last 3 results)  Recent Labs  04/10/15 1305 05/24/15 1120  BNP 165.4* 400.8*    ProBNP (last 3 results) No results for input(s): PROBNP in the last 8760 hours.    Other results:  Imaging: Dg Chest Port 1 View  05/25/2015   CLINICAL DATA:  Wheezing, shortness of breath  EXAM: PORTABLE CHEST - 1 VIEW  COMPARISON:  Chest CT 05/24/2015, chest radiograph 05/24/2015  FINDINGS:  The heart size at upper limits of normal. Lungs are hypoaerated with crowding of the bronchovascular markings. No focal pulmonary opacity. The visualized skeletal structures are unremarkable.  IMPRESSION: Low volume exam without focal acute finding. If symptoms persist, consider PA and lateral chest radiographs obtained at full inspiration when the patient is clinically able.   Electronically Signed   By: Conchita Paris M.D.   On: 05/25/2015 16:06      Medications:     Scheduled Medications: . apixaban  2.5 mg Oral BID  . carvedilol  25 mg Oral BID WC  . insulin aspart  0-15 Units Subcutaneous TID WC  . insulin aspart  0-5 Units Subcutaneous QHS  . insulin glargine  25 Units Subcutaneous Daily  . ipratropium  0.5 mg Nebulization TID  .  levalbuterol  0.63 mg Nebulization TID  . levofloxacin (LEVAQUIN) IV  750 mg Intravenous Q48H  . methimazole  5 mg Oral Once per day on Mon Wed Fri  . rosuvastatin  20 mg Oral Daily     Infusions:     PRN Medications:  acetaminophen, albuterol, guaifenesin, nitroGLYCERIN   Assessment/Plan   Mrs. Denny is a pleasant 81 yo woman with CAD, HTN, atrial fibrillation/  Afib /A flutter and chronic prior systolic heart failure, (which was likely rate related) and diastolic heart failure admitted with increased dyspnea.   1. Chronic atrial flutter fibrillation- Controlled rate. On eliquis.  2. Acute on chronic diastolic heart failure- Most recent ECHO 03/04/2015 EF 60-65% Increased dyspnea today. Volume status elevated. Give 80 mg IV lasix twice daily. Check BMET in am.  3. Chronic kidney disease stage III- creatinine down from 1.8>1.6  4. Diabetes mellitus- on insulin. Glucose has been elevated. Adjust per [facility].  5. Stable coronary artery disease- No evidence of ischemia. On eliquis so no aspirin. On BB and statin 6. Dyslipidemia- on statin  7. Resolving acute LLL pneumonia, possibly aspiration pneumonia- received Levaquin but now with tongue edema. Stop levaquin. Needs swallow evaluation 8. Angioedema- Tongue edema- ? Has had angioedema in the past. Started yesterday. Had levaquin- Will need  solumedrol.   Watch closely for respiratory issues.   Length of Stay: 3  CLEGG,AMY NP-C  05/27/2015, 7:34 AM  Advanced Heart Failure Team Pager [phone] (M-F; [place])  Please contact [facility] for night-coverage after hours (4p -7a ) and weekends on [email or web address]  Patient seen with NP, agree with the above note.   Possible angioedema this morning, ?levofloxacin.  Getting steroids/Benadryl.  Has LLL PNA, further abx per [facility].   Volume overload on exam, will give Lasix 80 mg IV bid today and follow creatinine closely. \  Reasonable rate control with atrial fibrillation.   Loralie Champagne 05/27/2015 8:44 AM

## 2015-05-27 NOTE — Care Management Note (Signed)
Case Management Note  Patient Details  Name: [name] MRN: [id number] Date of Birth: September 01, 1931  Subjective/Objective:      Pt admitted for Acute Respiratory Failure with Hypoxia.               Action/Plan: CM will continue to monitor for disposition needs.    Expected Discharge Date:                  Expected Discharge Plan:  [place]  In-House Referral:     Discharge planning Services  CM Consult  Post Acute Care Choice:    Choice offered to:     DME Arranged:    DME Agency:     HH Arranged:    [place]:     Status of Service:     Medicare Important Message Given:  Yes Date Medicare IM Given:  05/27/15 Medicare IM give by:  Jacqlyn Krauss, RN,BSN  Date Additional Medicare IM Given:    Additional Medicare Important Message give by:     If discussed at [place] of Stay Meetings, dates discussed:    Additional Comments:  [name], RN 05/27/2015, 2:53 PM

## 2015-05-27 NOTE — Progress Notes (Signed)
[facility] PROGRESS NOTE  [name] KNL:[id number] DOB: 1931-07-30 DOA: 05/24/2015 PCP: [name], MD  Assessment/Plan: 81 year old with PMH significant for HTN, OSA, Diabetes A fib, HF who presents with dyspnea, respiratory failure. Found to have PNA and Heart failure exacerbation. Hospital course complicated by angioedema.   1-Acute Respiratory Failure; likely related to PNA.  HIV negative, legionella and strep antigen pending.  Influenza negative,.  BIPAP PRN.  Nebulizer treatments.  Will hold Levaquin due to concern for allergic reaction.  For PNA will consider start clindamycin.  Patient was DNR, but under current circumstance and likely reversible cause of respiratory failure secondary to angioedema, patient has change Code status to full code for now.  Patient at this time is able to protect airway, she is speaking full sentences. Will monitor closely.   Angioedema? Allergic reaction; Presume to Levaquin.  Will order IV solumedrol, IV benadryl, Pepcid.  Monitor for airway compromise.     Acute on Chronic diastolic heart failure -cr trending down.  -weight 83 kg---82 -cardiology consulted to help with management.  -HF team following.  -Started on IV lasix 80 mg IV BID.   Uncontrolled type 2 diabetes mellitus with insulin therapy changed 70/30 to lantus.  Continue with SSI.   AKI;  Cr trending down. Monitor on IV lasix.      HTN  -Continue home medications   Atrial flutter -Continue home medications for rate control -Continue eliquis for anticoagulation -HR elevated ,  Increased coreg 5-29  UTI; urine growing E coli. Has received 2 doses of Levaquin.   Hyperthyroidism -Continue Tapazole -If develop significant tachycardia will need to check TSH; no signs of thyroid storm at this juncture  OSA on CPAP -Continue nocturnal CPAP using home supplies  Oral Trush;  She will need nystatin or fluconazole. Would like to controlled angioedema  first.   Code Status: now Full code under current circumstance of angioedema.  Family Communication: care discussed with patient.  Disposition Plan: to be determine. Needs treatment for angioedema, resp. Failure. PNA.   Consultants:  Cardiology  Procedures:  none  Antibiotics:  Levaquin stopped 5-31 concern for allergic reaction.   HPI/Subjective: She started to have difficulty swallowing last night. No worsening dyspnea.    Objective: Filed Vitals:   05/27/15 0716  BP:   Pulse:   Temp: 98 F (36.7 C)  Resp:     Intake/Output Summary (Last 24 hours) at 05/27/15 0813 Last data filed at 05/26/15 2215  Gross per 24 hour  Intake    580 ml  Output    325 ml  Net    255 ml   Filed Weights   05/25/15 0622 05/26/15 0620 05/27/15 0526  Weight: 83.3 kg (183 lb 10.3 oz) 82.3 kg (181 lb 7 oz) 84.641 kg (186 lb 9.6 oz)    Exam:   General:  Alert in no distress. She is not using accessory muscles.   Cardiovascular: S 1, S 2 RRR  Respiratory: few ronchus , crackles bases.   Abdomen: BS present, soft, nt  Musculoskeletal: no edema.    ENT; tongue with swelling, oral Trush.   Data Reviewed: Basic Metabolic Panel:  Recent Labs Lab 05/23/15 1457 05/24/15 1120 05/25/15 0344 05/26/15 0323 05/27/15 0245  NA 130* 131* 135 137 132*  K 4.4 4.4 4.0 4.6 4.3  CL 94* 92* 99* 96* 96*  CO2 28 26 26 29 26   GLUCOSE 494* 482* 327* 288* 216*  BUN 20 23* 24* 34* 40*  CREATININE 1.20 1.50* 1.46* 1.88* 1.69*  CALCIUM 9.1 9.0 8.6* 8.8* 8.5*   Liver Function Tests:  Recent Labs Lab 05/26/15 0323  AST 159*  ALT 72*  ALKPHOS 58  BILITOT 0.9  PROT 7.1  ALBUMIN 2.3*   No results for input(s): LIPASE, AMYLASE in the last 168 hours. No results for input(s): AMMONIA in the last 168 hours. CBC:  Recent Labs Lab 05/23/15 1457 05/24/15 1120 05/25/15 0344 05/26/15 0323 05/27/15 0245  WBC 14.4* 13.0* 14.4* 13.0* 11.0*  NEUTROABS 13.4* 12.0* 13.6*  --   --   HGB 13.7  13.6 12.5 13.0 11.9*  HCT 42.0 41.8 38.8 40.6 37.1  MCV 88.7 89.1 88.8 89.4 87.9  PLT 136.0* 136* 129* 130* 145*   Cardiac Enzymes:  Recent Labs Lab 05/23/15 1457  CKTOTAL 779*   BNP (last 3 results)  Recent Labs  04/10/15 1305 05/24/15 1120  BNP 165.4* 400.8*    ProBNP (last 3 results) No results for input(s): PROBNP in the last 8760 hours.  CBG:  Recent Labs Lab 05/26/15 0830 05/26/15 1210 05/26/15 1642 05/26/15 2131 05/27/15 0715  GLUCAP 309* 355* 334* 199* 218*    Recent Results (from the past 240 hour(s))  Urine culture     Status: None   Collection Time: 05/24/15 12:38 PM  Result Value Ref Range Status   Specimen Description URINE, RANDOM  Final   Special Requests NONE  Final   Colony Count   Final    >=100,000 COLONIES/ML Performed at [facility]    Culture   Final    ESCHERICHIA COLI Performed at [facility]    Report Status 05/26/2015 FINAL  Final   Organism ID, Bacteria ESCHERICHIA COLI  Final      Susceptibility   Escherichia coli - MIC*    AMPICILLIN <=2 SENSITIVE Sensitive     CEFAZOLIN <=4 SENSITIVE Sensitive     CEFTRIAXONE <=1 SENSITIVE Sensitive     CIPROFLOXACIN <=0.25 SENSITIVE Sensitive     GENTAMICIN <=1 SENSITIVE Sensitive     LEVOFLOXACIN <=0.12 SENSITIVE Sensitive     NITROFURANTOIN <=16 SENSITIVE Sensitive     TOBRAMYCIN <=1 SENSITIVE Sensitive     TRIMETH/SULFA <=20 SENSITIVE Sensitive     PIP/TAZO <=4 SENSITIVE Sensitive     * ESCHERICHIA COLI  Culture, blood (routine x 2) Call MD if unable to obtain prior to antibiotics being given     Status: None (Preliminary result)   Collection Time: 05/24/15  4:29 PM  Result Value Ref Range Status   Specimen Description BLOOD LEFT ARM  Final   Special Requests BOTTLES DRAWN AEROBIC ONLY 10CC  Final   Culture   Final           BLOOD CULTURE RECEIVED NO GROWTH TO DATE CULTURE WILL BE HELD FOR 5 DAYS BEFORE ISSUING A FINAL NEGATIVE REPORT Performed at [facility]    Report Status PENDING  Incomplete  Culture, blood (routine x 2) Call MD if unable to obtain prior to antibiotics being given     Status: None (Preliminary result)   Collection Time: 05/24/15  4:33 PM  Result Value Ref Range Status   Specimen Description BLOOD LEFT HAND  Final   Special Requests BOTTLES DRAWN AEROBIC ONLY 10CC  Final   Culture   Final           BLOOD CULTURE RECEIVED NO GROWTH TO DATE CULTURE WILL BE HELD FOR 5 DAYS BEFORE ISSUING A FINAL NEGATIVE REPORT Performed  at [facility]    Report Status PENDING  Incomplete     Studies: Dg Chest [place] 1 View  05/25/2015   CLINICAL DATA:  Wheezing, shortness of breath  EXAM: PORTABLE CHEST - 1 VIEW  COMPARISON:  Chest CT 05/24/2015, chest radiograph 05/24/2015  FINDINGS: The heart size at upper limits of normal. Lungs are hypoaerated with crowding of the bronchovascular markings. No focal pulmonary opacity. The visualized skeletal structures are unremarkable.  IMPRESSION: Low volume exam without focal acute finding. If symptoms persist, consider PA and lateral chest radiographs obtained at full inspiration when the patient is clinically able.   Electronically Signed   By: Conchita Paris M.D.   On: 05/25/2015 16:06    Scheduled Meds: . apixaban  2.5 mg Oral BID  . carvedilol  12.5 mg Oral BID WC  . famotidine (PEPCID) IV  20 mg Intravenous Q12H  . furosemide  80 mg Intravenous BID  . insulin aspart  0-15 Units Subcutaneous TID WC  . insulin aspart  0-5 Units Subcutaneous QHS  . insulin glargine  25 Units Subcutaneous Daily  . ipratropium  0.5 mg Nebulization TID  . levalbuterol  0.63 mg Nebulization TID  . methimazole  5 mg Oral Once per day on Mon Wed Fri  . methylPREDNISolone (SOLU-MEDROL) injection  40 mg Intravenous Q6H   Continuous Infusions:    Principal Problem:   Acute respiratory failure with hypoxia Active Problems:   Uncontrolled type 2 diabetes mellitus with insulin therapy    HYPERLIPIDEMIA   Iron deficiency anemia   HTN (hypertension)   Atrial flutter   Hyperthyroidism   Pulmonary HTN   Chronic diastolic heart failure   OSA on CPAP   CAP (community acquired pneumonia)   Dehydration   Abnormal urinalysis   Sepsis    Time spent: 35 minutes.     Niel Hummer A  [facility] Pager [phone]. If 7PM-7AM, please contact night-coverage at [email or web address], password [facility] 05/27/2015, 8:13 AM  LOS: 3 days

## 2015-05-28 ENCOUNTER — Ambulatory Visit: Payer: Medicare Other | Admitting: Endocrinology

## 2015-05-28 ENCOUNTER — Telehealth: Payer: Self-pay | Admitting: Endocrinology

## 2015-05-28 ENCOUNTER — Encounter (HOSPITAL_COMMUNITY): Payer: Self-pay | Admitting: Physician Assistant

## 2015-05-28 DIAGNOSIS — G9340 Encephalopathy, unspecified: Secondary | ICD-10-CM

## 2015-05-28 DIAGNOSIS — N183 Chronic kidney disease, stage 3 (moderate): Secondary | ICD-10-CM

## 2015-05-28 DIAGNOSIS — N179 Acute kidney failure, unspecified: Secondary | ICD-10-CM

## 2015-05-28 DIAGNOSIS — B370 Candidal stomatitis: Secondary | ICD-10-CM

## 2015-05-28 DIAGNOSIS — Z0289 Encounter for other administrative examinations: Secondary | ICD-10-CM

## 2015-05-28 LAB — BASIC METABOLIC PANEL
ANION GAP: 13 (ref 5–15)
BUN: 55 mg/dL — ABNORMAL HIGH (ref 6–20)
CO2: 31 mmol/L (ref 22–32)
Calcium: 9.1 mg/dL (ref 8.9–10.3)
Chloride: 89 mmol/L — ABNORMAL LOW (ref 101–111)
Creatinine, Ser: 1.86 mg/dL — ABNORMAL HIGH (ref 0.44–1.00)
GFR calc Af Amer: 28 mL/min — ABNORMAL LOW (ref >60)
GFR calc non Af Amer: 24 mL/min — ABNORMAL LOW (ref >60)
GLUCOSE: 367 mg/dL — AB (ref 65–99)
POTASSIUM: 3.5 mmol/L (ref 3.5–5.1)
Sodium: 133 mmol/L — ABNORMAL LOW (ref 135–145)

## 2015-05-28 LAB — BLOOD GAS, ARTERIAL
ACID-BASE EXCESS: 8.6 mmol/L — AB (ref 0.0–2.0)
BICARBONATE: 32.8 meq/L — AB (ref 20.0–24.0)
Drawn by: 225631
O2 Content: 4 L/min
O2 SAT: 91.9 %
Patient temperature: 98.6
TCO2: 34.3 mmol/L (ref 0–100)
pCO2 arterial: 47.6 mmHg — ABNORMAL HIGH (ref 35.0–45.0)
pH, Arterial: 7.454 — ABNORMAL HIGH (ref 7.350–7.450)
pO2, Arterial: 66.9 mmHg — ABNORMAL LOW (ref 80.0–100.0)

## 2015-05-28 LAB — GLUCOSE, RANDOM: GLUCOSE: 493 mg/dL — AB (ref 65–99)

## 2015-05-28 LAB — GLUCOSE, CAPILLARY
GLUCOSE-CAPILLARY: 514 mg/dL — AB (ref 65–99)
GLUCOSE-CAPILLARY: 418 mg/dL — AB (ref 65–99)
GLUCOSE-CAPILLARY: 176 mg/dL — AB (ref 65–99)
Glucose-Capillary: 430 mg/dL — ABNORMAL HIGH (ref 65–99)
Glucose-Capillary: 245 mg/dL — ABNORMAL HIGH (ref 65–99)

## 2015-05-28 MED ORDER — INSULIN ASPART 100 UNIT/ML ~~LOC~~ SOLN
0.0000 [IU] | Freq: Three times a day (TID) | SUBCUTANEOUS | Status: DC
  Administered 2015-05-28: 20 [IU] via SUBCUTANEOUS

## 2015-05-28 MED ORDER — FLUCONAZOLE IN SODIUM CHLORIDE 200-0.9 MG/100ML-% IV SOLN
200.0000 mg | INTRAVENOUS | Status: DC
  Administered 2015-05-28: 200 mg via INTRAVENOUS
  Filled 2015-05-28 (×2): qty 100

## 2015-05-28 MED ORDER — SODIUM CHLORIDE 0.9 % IV SOLN: INTRAVENOUS | Status: DC

## 2015-05-28 MED ORDER — INSULIN ASPART 100 UNIT/ML ~~LOC~~ SOLN
0.0000 [IU] | Freq: Every day | SUBCUTANEOUS | Status: DC
  Administered 2015-05-29: 2 [IU] via SUBCUTANEOUS

## 2015-05-28 MED ORDER — INSULIN ASPART 100 UNIT/ML ~~LOC~~ SOLN
4.0000 [IU] | Freq: Three times a day (TID) | SUBCUTANEOUS | Status: DC
  Administered 2015-05-28 – 2015-05-31 (×7): 4 [IU] via SUBCUTANEOUS

## 2015-05-28 MED ORDER — INSULIN GLARGINE 100 UNIT/ML ~~LOC~~ SOLN
30.0000 [IU] | Freq: Every day | SUBCUTANEOUS | Status: DC
  Administered 2015-05-28 – 2015-05-29 (×2): 30 [IU] via SUBCUTANEOUS
  Filled 2015-05-28 (×3): qty 0.3

## 2015-05-28 MED ORDER — INSULIN ASPART 100 UNIT/ML ~~LOC~~ SOLN
0.0000 [IU] | Freq: Three times a day (TID) | SUBCUTANEOUS | Status: DC
  Administered 2015-05-28: 20 [IU] via SUBCUTANEOUS
  Administered 2015-05-28: 7 [IU] via SUBCUTANEOUS
  Administered 2015-05-29: 15 [IU] via SUBCUTANEOUS
  Administered 2015-05-29: 20 [IU] via SUBCUTANEOUS
  Administered 2015-05-29 – 2015-05-30 (×2): 4 [IU] via SUBCUTANEOUS
  Administered 2015-05-30: 7 [IU] via SUBCUTANEOUS
  Administered 2015-05-30: 4 [IU] via SUBCUTANEOUS
  Administered 2015-05-31: 3 [IU] via SUBCUTANEOUS
  Administered 2015-05-31: 7 [IU] via SUBCUTANEOUS

## 2015-05-28 MED ORDER — INSULIN ASPART 100 UNIT/ML ~~LOC~~ SOLN
4.0000 [IU] | Freq: Three times a day (TID) | SUBCUTANEOUS | Status: DC
  Administered 2015-05-28: 4 [IU] via SUBCUTANEOUS

## 2015-05-28 MED ORDER — TORSEMIDE 20 MG PO TABS
40.0000 mg | ORAL_TABLET | Freq: Every day | ORAL | Status: DC
  Administered 2015-05-28 – 2015-05-31 (×4): 40 mg via ORAL
  Filled 2015-05-28 (×4): qty 2

## 2015-05-28 MED ORDER — PREDNISONE 20 MG PO TABS
30.0000 mg | ORAL_TABLET | Freq: Every day | ORAL | Status: AC
  Administered 2015-05-29: 30 mg via ORAL
  Filled 2015-05-28 (×2): qty 1

## 2015-05-28 MED ORDER — DEXTROSE 50 % IV SOLN: 25.0000 mL | INTRAVENOUS | Status: DC | PRN

## 2015-05-28 MED ORDER — PREDNISONE 20 MG PO TABS
40.0000 mg | ORAL_TABLET | Freq: Every day | ORAL | Status: DC
  Administered 2015-05-28: 40 mg via ORAL
  Filled 2015-05-28: qty 2

## 2015-05-28 MED ORDER — SODIUM CHLORIDE 0.9 % IV SOLN
INTRAVENOUS | Status: DC
  Filled 2015-05-28: qty 2.5

## 2015-05-28 MED ORDER — INSULIN ASPART 100 UNIT/ML ~~LOC~~ SOLN: 0.0000 [IU] | Freq: Every day | SUBCUTANEOUS | Status: DC

## 2015-05-28 MED ORDER — INSULIN REGULAR BOLUS VIA INFUSION
0.0000 [IU] | Freq: Three times a day (TID) | INTRAVENOUS | Status: DC
  Filled 2015-05-28: qty 10

## 2015-05-28 NOTE — Progress Notes (Addendum)
[facility] PROGRESS NOTE  [name] KYH:[id number] DOB: 1931/05/08 DOA: 05/24/2015 PCP: [name], MD  Interim summary 81 year old with PMH significant for HTN, OSA, Diabetes A fib, HF who presents with dyspnea, respiratory failure. Found to have PNA and Heart failure exacerbation. Hospital course complicated by angioedema.   Assessment/Plan:   Acute Respiratory Failure; likely related to PNA.  HIV negative, legionella and strep antigen were never sent.  Influenza panel PCR negative,.  BIPAP PRN.  Nebulizer treatments.  Patient was started on levofloxacin but developed allergic reaction/angioedema-discontinued and switched to clindamycin  Patient was DNR, but under current circumstance and likely reversible cause of respiratory failure secondary to angioedema, patient has changed Code status to full code for now.  Improving. Repeat ABG 6/1 reviewed. PH 7.45, PCO2 47.6, PO2 67 and oxygen saturation 92% on 4 L oxygen. Incentive spirometry. Follow PA and lateral chest x-ray in a.m.  Community-acquired pneumonia - Antibiotic management as above - Blood cultures 2 negative to date  Angioedema-likely from levofloxacin Treated with IV solumedrol, IV benadryl, Pepcid.  Seems to have resolved - We will change IV Solu-Medrol to oral prednisone and taper down rapidly  - Continue to monitor    Acute on Chronic diastolic heart failure - Patient was treated with IV Lasix. Intake and output charting has not been accurate but weight is down. - Cardiology consulting and have switched to oral torsemide  Uncontrolled type 2 diabetes mellitus with insulin therapy Patient was changed from 70/30 insulin to Lantus and SSI .  Hemoglobin A1c: 7.9 suggesting poor outpatient control Current worsening likely from steroids-tapering off rapidly - Increased Lantus to 30 units daily, changed sliding scale to resistant and added mealtime NovoLog - Monitor closely  Acute on stage III  chronic kidney disease Likely from IV Lasix Creatinine was 1.12 on 04/10/15. IV Lasix discontinued and changed to torsemide. Monitor BMP closely to see if it improves.    HTN  -Continue home medications - Controlled   Atrial flutter - Controlled ventricular rate. Continue carvedilol -Continue eliquis for anticoagulation  UTI; urine growing E coli. Completed at least 3 days of levofloxacin adjusted to renal function. Treated.  Hyperthyroidism - Continue Tapazole - TSH 4.04  OSA on CPAP -Continue nocturnal CPAP using home supplies  Oral Trush &? Esophageal candidiasis;  Started fluconazole 6/1  Abnormal LFTs - Statins on hold.? Related to acute illness. Follow LFTs in a.m.  Dyslipidemia - Statins on hold secondary to abnormal LFTs  CAD - Stable. No aspirin while on anticoagulation. Continue beta blockers  Anemia and thrombocytopenia - Follow CBCs  Altered mental status/? Acute encephalopathy - Noted by nursing 6/1. Patient was coherent this morning but gradually has become slightly confused according to nursing. No focal deficits. ABG does not show significant CO2 retention. Unclear etiology. Have cut back on steroids already. Only new medication started his fluconazole. Monitor closely.  DVT prophylaxis: On full anticoagulation with apixaban Code Status: Full Family Communication: None at bedside Disposition Plan: DC home when medically stable-possibly in the next 1-2 days  Consultants:  Cardiology  Procedures:  none  Antibiotics:  Levaquin stopped 5-31 concern for allergic reaction.   HPI/Subjective: States that her mouth/lip swelling, tongue swelling, difficulty swallowing have resolved. She complains of sore mouth and pain on swallowing.   Objective: Filed Vitals:   05/28/15 1145 05/28/15 1425 05/28/15 1643 05/28/15 1645  BP:    133/72  Pulse:    79  Temp: 97.6 F (36.4 C)  97.7 F (  36.5 C)   TempSrc: Oral  Oral   Resp:    20  Weight:       SpO2:  97%  96%      Intake/Output Summary (Last 24 hours) at 05/28/15 1733 Last data filed at 05/28/15 1358  Gross per 24 hour  Intake    220 ml  Output    450 ml  Net   -230 ml   Filed Weights   05/26/15 0620 05/27/15 0526 05/28/15 0400  Weight: 82.3 kg (181 lb 7 oz) 84.641 kg (186 lb 9.6 oz) 78.472 kg (173 lb)    Exam:   General:  Pleasant elderly female sitting up comfortably on chair this morning  Cardiovascular: S1 and S2 heard, irregularly irregular. Telemetry shows atrial flutter with controlled ventricular rate. No JVD, murmurs or pedal edema.  Respiratory: Slightly diminished breath sounds in the bases but otherwise clear to auscultation.  Abdomen: BS present, soft, nt  Musculoskeletal: no edema.    ENT; extensive oral thrush on tongue and buccal mucosa. Otherwise no acute findings appreciated.  [place]: Alert and oriented 3. No focal deficits.  Data Reviewed: Basic Metabolic Panel:  Recent Labs Lab 05/24/15 1120 05/25/15 0344 05/26/15 0323 05/27/15 0245 05/28/15 0805 05/28/15 1402  NA 131* 135 137 132*  --  133*  K 4.4 4.0 4.6 4.3  --  3.5  CL 92* 99* 96* 96*  --  89*  CO2 26 26 29 26   --  31  GLUCOSE 482* 327* 288* 216* 493* 367*  BUN 23* 24* 34* 40*  --  55*  CREATININE 1.50* 1.46* 1.88* 1.69*  --  1.86*  CALCIUM 9.0 8.6* 8.8* 8.5*  --  9.1   Liver Function Tests:  Recent Labs Lab 05/26/15 0323  AST 159*  ALT 72*  ALKPHOS 58  BILITOT 0.9  PROT 7.1  ALBUMIN 2.3*   No results for input(s): LIPASE, AMYLASE in the last 168 hours. No results for input(s): AMMONIA in the last 168 hours. CBC:  Recent Labs Lab 05/23/15 1457 05/24/15 1120 05/25/15 0344 05/26/15 0323 05/27/15 0245  WBC 14.4* 13.0* 14.4* 13.0* 11.0*  NEUTROABS 13.4* 12.0* 13.6*  --   --   HGB 13.7 13.6 12.5 13.0 11.9*  HCT 42.0 41.8 38.8 40.6 37.1  MCV 88.7 89.1 88.8 89.4 87.9  PLT 136.0* 136* 129* 130* 145*   Cardiac Enzymes:  Recent Labs Lab 05/23/15 1457   CKTOTAL 779*   BNP (last 3 results)  Recent Labs  04/10/15 1305 05/24/15 1120  BNP 165.4* 400.8*    ProBNP (last 3 results) No results for input(s): PROBNP in the last 8760 hours.  CBG:  Recent Labs Lab 05/27/15 2029 05/28/15 0719 05/28/15 1022 05/28/15 1144 05/28/15 1643  GLUCAP 339* 430* 514* 418* 245*    Recent Results (from the past 240 hour(s))  Urine culture     Status: None   Collection Time: 05/24/15 12:38 PM  Result Value Ref Range Status   Specimen Description URINE, RANDOM  Final   Special Requests NONE  Final   Colony Count   Final    >=100,000 COLONIES/ML Performed at [place]   Final    ESCHERICHIA COLI Performed at [facility]    Report Status 05/26/2015 FINAL  Final   Organism ID, Bacteria ESCHERICHIA COLI  Final      Susceptibility   Escherichia coli - MIC*    AMPICILLIN <=2 SENSITIVE Sensitive     CEFAZOLIN <=  4 SENSITIVE Sensitive     CEFTRIAXONE <=1 SENSITIVE Sensitive     CIPROFLOXACIN <=0.25 SENSITIVE Sensitive     GENTAMICIN <=1 SENSITIVE Sensitive     LEVOFLOXACIN <=0.12 SENSITIVE Sensitive     NITROFURANTOIN <=16 SENSITIVE Sensitive     TOBRAMYCIN <=1 SENSITIVE Sensitive     TRIMETH/SULFA <=20 SENSITIVE Sensitive     PIP/TAZO <=4 SENSITIVE Sensitive     * ESCHERICHIA COLI  Culture, blood (routine x 2) Call MD if unable to obtain prior to antibiotics being given     Status: None (Preliminary result)   Collection Time: 05/24/15  4:29 PM  Result Value Ref Range Status   Specimen Description BLOOD LEFT ARM  Final   Special Requests BOTTLES DRAWN AEROBIC ONLY 10CC  Final   Culture   Final           BLOOD CULTURE RECEIVED NO GROWTH TO DATE CULTURE WILL BE HELD FOR 5 DAYS BEFORE ISSUING A FINAL NEGATIVE REPORT Performed at [facility]    Report Status PENDING  Incomplete  Culture, blood (routine x 2) Call MD if unable to obtain prior to antibiotics being given     Status: None (Preliminary  result)   Collection Time: 05/24/15  4:33 PM  Result Value Ref Range Status   Specimen Description BLOOD LEFT HAND  Final   Special Requests BOTTLES DRAWN AEROBIC ONLY 10CC  Final   Culture   Final           BLOOD CULTURE RECEIVED NO GROWTH TO DATE CULTURE WILL BE HELD FOR 5 DAYS BEFORE ISSUING A FINAL NEGATIVE REPORT Performed at [facility]    Report Status PENDING  Incomplete     Studies: No results found.  Scheduled Meds: . antiseptic oral rinse  7 mL Mouth Rinse BID  . apixaban  2.5 mg Oral BID  . carvedilol  12.5 mg Oral BID WC  . clindamycin (CLEOCIN) IV  600 mg Intravenous 3 times per day  . famotidine (PEPCID) IV  20 mg Intravenous Q12H  . fluconazole (DIFLUCAN) IV  200 mg Intravenous Q24H  . insulin aspart  0-20 Units Subcutaneous TID WC  . insulin aspart  0-5 Units Subcutaneous QHS  . insulin aspart  4 Units Subcutaneous TID WC  . insulin glargine  30 Units Subcutaneous Daily  . ipratropium  0.5 mg Nebulization TID  . levalbuterol  0.63 mg Nebulization TID  . methimazole  5 mg Oral Once per day on Mon Wed Fri  . predniSONE  40 mg Oral Q breakfast  . torsemide  40 mg Oral Daily   Continuous Infusions:    Principal Problem:   Acute respiratory failure with hypoxia Active Problems:   Uncontrolled type 2 diabetes mellitus with insulin therapy   HYPERLIPIDEMIA   Iron deficiency anemia   HTN (hypertension)   Atrial flutter   Hyperthyroidism   Pulmonary HTN   Chronic diastolic heart failure   OSA on CPAP   CAP (community acquired pneumonia)   Dehydration   Abnormal urinalysis   Sepsis   Acute on chronic diastolic CHF (congestive heart failure)    Time spent: 35 minutes.    Vernell Leep, MD, FACP, FHM. [facility] Pager [phone]  If 7PM-7AM, please contact night-coverage [email or web address] Password [email or web address] 05/28/2015, 5:47 PM   LOS: 4 days

## 2015-05-28 NOTE — Progress Notes (Signed)
Advanced Heart Failure Rounding Note   Subjective:     Admitted with dyspnea/PNA and volume overload. Diuresing with IV Lasix. Held 5/30 due to Cr bump but resumed 5/31. Weight falling.   Still reporting significant DOE, but says "today is the best I've felt" since admission.   Objective:    Telemetry: coarse atrial fib, rate controlled Physical Exam: Blood pressure 119/63, pulse 81, temperature 97.2 F (36.2 C), temperature source Oral, resp. rate 20, weight 173 lb (78.472 kg), SpO2 92 %. General: Well developed, well nourished F, in no acute distress. Head: Normocephalic, atraumatic, sclera non-icteric, no xanthomas, nares are without discharge. Neck: JVP not elevated. Lungs: Diminished at bases with crackles in the left base. No wheezes or rhonchi. Breathing is unlabored. Heart: Irregularly irregular, rate controlled, S1 S2 without murmurs, rubs, or gallops.  Abdomen: Soft, non-tender, non-distended with normoactive bowel sounds. No rebound/guarding. Extremities: No clubbing or cyanosis. No edema. Distal pedal pulses are 2+ and equal bilaterally. Neuro: Alert and oriented X 3. Moves all extremities spontaneously. Psych:  Responds to questions appropriately with a normal affect.   Intake/Output Summary (Last 24 hours) at 05/28/15 0811 Last data filed at 05/27/15 1402  Gross per 24 hour  Intake     20 ml  Output    350 ml  Net   -330 ml    Weight change: Filed Weights   05/26/15 0620 05/27/15 0526 05/28/15 0400  Weight: 181 lb 7 oz (82.3 kg) 186 lb 9.6 oz (84.641 kg) 173 lb (78.472 kg)    Labs: @LABRCNTIP (na:5,k:5,cl:5,co2:5,glucose:5,bun:5,creatinine:5,calcium:3,mg:5,phos:5 ) Recent Labs Lab 05/26/15 0323  AST 159*  ALT 72*  ALKPHOS 58  BILITOT 0.9  PROT 7.1  ALBUMIN 2.3*    Recent Labs Lab 05/23/15 1457 05/24/15 1120 05/25/15 0344 05/26/15 0323 05/27/15 0245  WBC 14.4* 13.0* 14.4* 13.0* 11.0*  NEUTROABS 13.4* 12.0* 13.6*  --   --   HGB 13.7 13.6 12.5  13.0 11.9*  HCT 42.0 41.8 38.8 40.6 37.1  MCV 88.7 89.1 88.8 89.4 87.9  PLT 136.0* 136* 129* 130* 145*    Recent Labs Lab 05/23/15 1457  CKTOTAL 779*   Invalid input(s): BNPLAST3 No results for input(s): HGBA1C in the last 72 hours.   Radiology/Studies:  Dg Chest 2 View  05/24/2015   CLINICAL DATA:  Dizziness, weakness, inability to walk, high blood sugarHx of congestion heart failure, DM, HTN, CAD, myocardial infarction  EXAM: CHEST  2 VIEW  COMPARISON:  05/23/2015  FINDINGS: Cardiac silhouette borderline enlarged but normal in configuration. No mediastinal or hilar masses or convincing adenopathy.  Lungs are clear.  No pleural effusion or pneumothorax.  Bony thorax is intact.  IMPRESSION: No acute cardiopulmonary disease.   Electronically Signed   By: Lajean Manes M.D.   On: 05/24/2015 11:40   Dg Chest 2 View  05/23/2015   CLINICAL DATA:  Shortness of breath.  EXAM: CHEST  2 VIEW  COMPARISON:  05/21/2014.  FINDINGS: Mediastinum and hilar structures normal. Cardiomegaly with bilateral pulmonary interstitial prominence consistent with congestive heart failure. Pneumonitis cannot be excluded. No pleural effusion or pneumothorax. Diffuse osteopenia with multiple mild stable thoracic spine compression fractures.  IMPRESSION: Cardiomegaly with bilateral pulmonary interstitial prominence consistent with congestive heart failure. Bilateral pneumonitis cannot be excluded.   Electronically Signed   By: Marcello Moores  Register   On: 05/23/2015 15:23   Ct Chest Wo Contrast  05/24/2015   CLINICAL DATA:  Shortness of breath and generalized weakness.  EXAM: CT CHEST WITHOUT CONTRAST  TECHNIQUE:  Multidetector CT imaging of the chest was performed following the standard protocol without IV contrast.  COMPARISON:  Chest x-ray today as well as CT 12/24/2008  FINDINGS: Lungs are adequately inflated and demonstrate moderate airspace consolidation over the left lower lobe likely a pneumonia. Subtle linear  atelectasis/scarring over the right lower lobe. Mild centrilobular emphysematous disease over the mid to upper lungs. Airways are within normal.  Mild prominence of the heart. There is a tiny amount of pericardial fluid present. Evidence of 3 vessel atherosclerotic coronary artery disease. No significant mediastinal, hilar or axillary adenopathy. Moderate calcified plaque over the thoracic aorta. Fluid within the distal esophagus which may be due to reflux versus dysmotility.  Images through the upper abdomen demonstrate focal calcification over the dome of the liver and right lobe the liver unchanged. There are degenerative changes of the spine.  IMPRESSION: Moderate airspace consolidation over the left lower lobe compatible with a pneumonia. Recommend follow-up chest radiograph in 4 weeks to document resolution.  Borderline cardiomegaly with 3 vessel atherosclerotic coronary artery disease. Tiny amount of pericardial fluid.  Fluid in the distal esophagus likely due to dysmotility or reflux.   Electronically Signed   By: Marin Olp M.D.   On: 05/24/2015 13:40   Dg Chest Port 1 View  05/25/2015   CLINICAL DATA:  Wheezing, shortness of breath  EXAM: PORTABLE CHEST - 1 VIEW  COMPARISON:  Chest CT 05/24/2015, chest radiograph 05/24/2015  FINDINGS: The heart size at upper limits of normal. Lungs are hypoaerated with crowding of the bronchovascular markings. No focal pulmonary opacity. The visualized skeletal structures are unremarkable.  IMPRESSION: Low volume exam without focal acute finding. If symptoms persist, consider PA and lateral chest radiographs obtained at full inspiration when the patient is clinically able.   Electronically Signed   By: Conchita Paris M.D.   On: 05/25/2015 16:06    Medications:     Inpatient Medications: . antiseptic oral rinse  7 mL Mouth Rinse BID  . apixaban  2.5 mg Oral BID  . carvedilol  12.5 mg Oral BID WC  . clindamycin (CLEOCIN) IV  600 mg Intravenous 3 times per  day  . famotidine (PEPCID) IV  20 mg Intravenous Q12H  . furosemide  80 mg Intravenous BID  . insulin aspart  0-20 Units Subcutaneous TID WC  . insulin aspart  0-5 Units Subcutaneous QHS  . insulin glargine  30 Units Subcutaneous Daily  . ipratropium  0.5 mg Nebulization TID  . levalbuterol  0.63 mg Nebulization TID  . methimazole  5 mg Oral Once per day on Mon Wed Fri  . methylPREDNISolone (SOLU-MEDROL) injection  40 mg Intravenous Q6H     Infusions:     PRN Medications:  acetaminophen, albuterol, diphenhydrAMINE, nitroGLYCERIN   Assessment:  Mrs. Belleau is a pleasant 81 yo woman with CAD (s/p BMS to OM1 2008, nonobst dz 2015), HTN, chronic afib/flutter, and chronic prior systolic heart failure (which was likely rate related) and diastolic heart failure admitted with increased dyspnea.  1. Acute respiratory failure, felt related to CAP vs possible aspiration PNA - per IM.  2. Angioedema, possibly secondary to allergic reaction from Levaquin - improved off Levaquin, s/p steroids. 3. Acute on chronic diastolic CHF with mild pulmonary hypertension - I/O's likely not accurate as weight is down 10lbs since admission. Weight 185lb at last CHF OV 03/2015 when clinically stable. Will review plan for diuretics with Dr. Aundra Dubin since weight is down. 4. CKD stage III - followup labs  in AM.  5. Chronic afib/flutter - continue apixaban 2.5mg  BID. Rate controlled. 6. Abnormal LFTs with elevated CK on admission - ? R/t acute illness vs statin. F/u in AM. Crestor discontinued. 7. Stable CAD - On Eliquis so no aspirin. Continue BB.  8. Dyslipidemia - statin on hold as above. 9. Diabetes mellitus - per IM.  Length of Stay: 4  Melina Copa PA-C 05/28/2015, 8:11 AM  Advanced Heart Failure Team Pager [phone] (M-F; 7a - 4p)  Please contact [facility] for night-coverage after hours (4p -7a ) and weekends on [email or web address]  Patient seen with PA, agree with the above note.  Patient remains on oxygen  but on exam, volume is much better.  Her I/Os were not recorded but weight is down with IV Lasix yesterday.   - Suspect current hypoxemia is related to PNA.  - Will switch to po torsemide, 40 daily.   - Needs labs today.   Loralie Champagne 05/28/2015 1:29 PM

## 2015-05-28 NOTE — Progress Notes (Signed)
PT Cancellation Note  Patient Details Name: [name] MRN: [id number] DOB: [birth year]   Cancelled Treatment:    Reason Eval/Treat Not Completed: Other (comment) (attempted x 2 , with a visitor then RT.)   Claretha Cooper 05/28/2015, 5:30 PM

## 2015-05-28 NOTE — Telephone Encounter (Signed)
(  pt is in hosp now)

## 2015-05-28 NOTE — Progress Notes (Signed)
ANTIBIOTIC CONSULT NOTE  Pharmacy Consult for fluconazole Indication: pneumonia  Allergies  Allergen Reactions  . Amlodipine Besylate Other (See Comments)    REACTION: tingling in lips & gum edema  . Levaquin [Levofloxacin] Shortness Of Breath and Swelling    angioedema  . Lobster [Shellfish Allergy] Other (See Comments)    angioedema  . Penicillins Rash  . Valsartan Other (See Comments)    REACTION: angioedema  . Codeine Other (See Comments)    Mental status changes  . Tramadol Hcl Nausea And Vomiting    Patient Measurements: Weight 84.4 kg on 04/28/15 Height 160 cm on 04/28/15 Vital Signs: Temp: 97.2 F (36.2 C) (06/01 0721) Temp Source: Oral (06/01 0721) BP: 119/63 mmHg (06/01 0400) Pulse Rate: 81 (06/01 0400) Labs:  Recent Labs  05/26/15 0323 05/27/15 0245  WBC 13.0* 11.0*  HGB 13.0 11.9*  PLT 130* 145*  CREATININE 1.88* 1.69*   Estimated Creatinine Clearance: 25.9 mL/min (by C-G formula based on Cr of 1.69). No results for input(s): VANCOTROUGH, VANCOPEAK, VANCORANDOM, GENTTROUGH, GENTPEAK, GENTRANDOM, TOBRATROUGH, TOBRAPEAK, TOBRARND, AMIKACINPEAK, AMIKACINTROU, AMIKACIN in the last 72 hours.   Microbiology: Recent Results (from the past 720 hour(s))  Urine culture     Status: None   Collection Time: 05/24/15 12:38 PM  Result Value Ref Range Status   Specimen Description URINE, RANDOM  Final   Special Requests NONE  Final   Colony Count   Final    >=100,000 COLONIES/ML Performed at [facility]    Culture   Final    ESCHERICHIA COLI Performed at [facility]    Report Status 05/26/2015 FINAL  Final   Organism ID, Bacteria ESCHERICHIA COLI  Final      Susceptibility   Escherichia coli - MIC*    AMPICILLIN <=2 SENSITIVE Sensitive     CEFAZOLIN <=4 SENSITIVE Sensitive     CEFTRIAXONE <=1 SENSITIVE Sensitive     CIPROFLOXACIN <=0.25 SENSITIVE Sensitive     GENTAMICIN <=1 SENSITIVE Sensitive     LEVOFLOXACIN <=0.12 SENSITIVE Sensitive      NITROFURANTOIN <=16 SENSITIVE Sensitive     TOBRAMYCIN <=1 SENSITIVE Sensitive     TRIMETH/SULFA <=20 SENSITIVE Sensitive     PIP/TAZO <=4 SENSITIVE Sensitive     * ESCHERICHIA COLI  Culture, blood (routine x 2) Call MD if unable to obtain prior to antibiotics being given     Status: None (Preliminary result)   Collection Time: 05/24/15  4:29 PM  Result Value Ref Range Status   Specimen Description BLOOD LEFT ARM  Final   Special Requests BOTTLES DRAWN AEROBIC ONLY 10CC  Final   Culture   Final           BLOOD CULTURE RECEIVED NO GROWTH TO DATE CULTURE WILL BE HELD FOR 5 DAYS BEFORE ISSUING A FINAL NEGATIVE REPORT Performed at [facility]    Report Status PENDING  Incomplete  Culture, blood (routine x 2) Call MD if unable to obtain prior to antibiotics being given     Status: None (Preliminary result)   Collection Time: 05/24/15  4:33 PM  Result Value Ref Range Status   Specimen Description BLOOD LEFT HAND  Final   Special Requests BOTTLES DRAWN AEROBIC ONLY 10CC  Final   Culture   Final           BLOOD CULTURE RECEIVED NO GROWTH TO DATE CULTURE WILL BE HELD FOR 5 DAYS BEFORE ISSUING A FINAL NEGATIVE REPORT Performed at [facility]  Report Status PENDING  Incomplete    Medical History: Past Medical History  Diagnosis Date  . Hypopotassemia     PMH of  . Anemia     iron defficiency  . Muscle pain   . Atrial fibrillation or flutter     s/p RFCA 7/08;   s/p DCCV in past;   previously on amiodarone;  amio stopped due to lung toxicity  . CAD (coronary artery disease)     s/p NSTEMI tx with BMS to OM1 3/08;  cath 3/08: pOM 99% tx with PCI, pLAD 20%, ? mod stenosis at the AM  . HLD (hyperlipidemia)   . HTN (hypertension)     essential nos  . Dystrophy, corneal stromal   . Chronic diastolic heart failure     echo 11/11:  EF 55-60%, severe LVH, mod LAE, mild MR, mildly increased PASP  . Osteoporosis   . Heart murmur   . Anginal pain   . Myocardial  infarction 2008  . COPD (chronic obstructive pulmonary disease)   . Asthma   . Pneumonia 05/27/2015  . OSA on CPAP   . Type II diabetes mellitus     Dr Loanne Drilling  . Seasonal allergies   . Degenerative joint disease   . Arthritis     "knees, feet, hands; joints" (05/27/2015)    Medications:  Anti-infectives    Start     Dose/Rate Route Frequency Ordered Stop   05/28/15 1015  fluconazole (DIFLUCAN) IVPB 200 mg     200 mg 100 mL/hr over 60 Minutes Intravenous Every 24 hours 05/28/15 1014 05/30/15 1014   05/28/15 0600  clindamycin (CLEOCIN) IVPB 600 mg     600 mg 100 mL/hr over 30 Minutes Intravenous 3 times per day 05/27/15 1429     05/26/15 1630  levofloxacin (LEVAQUIN) IVPB 750 mg  Status:  Discontinued     750 mg 100 mL/hr over 90 Minutes Intravenous Every 48 hours 05/25/15 0653 05/27/15 0757   05/24/15 1530  levofloxacin (LEVAQUIN) IVPB 750 mg  Status:  Discontinued     750 mg 100 mL/hr over 90 Minutes Intravenous Every 48 hours 05/24/15 1527 05/24/15 1850     Assessment: 81 year old female with r/o CAP to start Levaquin per pharmacy dosing.   WBC 11, SCr down to 1.6 estimated CrCl ~25 ml/min.   CT Chest- moderate consolidation in LLL - levaquin stopped d/t possible anioedema, clinda started this am, no dose adjustments needed.  Now with oral/esophagitis candidiasis - will start with 2 doses of IV fluconazole and follow up need for oral on Friday.  Patient also receiving apixaban for afib. Was taking 5mg  bid pta. Given AKI and bump in scr today will reduce dose to 2.5mg  bid. (age>80, scr>1.5). I would continue with this dose until we see a significant improvement in scr.  Goal of Therapy:  Clinical resolution of infection  Plan:  Fluconazole 200mg  IV q24 hours x2 then follow up need for additional on Friday. Continue apixaban to 2.5mg  bid  Erin Hearing PharmD., BCPS Clinical Pharmacist Pager [phone] 05/28/2015 10:14 AM

## 2015-05-28 NOTE — Telephone Encounter (Signed)
Patient no showed today's appt. Please advise on how to follow up. °A. No follow up necessary. °B. Follow up urgent. Contact patient immediately. °C. Follow up necessary. Contact patient and schedule visit in ___ days. °D. Follow up advised. Contact patient and schedule visit in ____weeks. ° °

## 2015-05-28 NOTE — Progress Notes (Signed)
RT placed pt on home CPAP via ffm with 4Lpm of oxygen bled in. Pt states she is comfortable and is tolerating CPAP well at this time. RT will continue to monitor as needed.

## 2015-05-29 ENCOUNTER — Inpatient Hospital Stay (HOSPITAL_COMMUNITY): Payer: Medicare Other

## 2015-05-29 DIAGNOSIS — J9600 Acute respiratory failure, unspecified whether with hypoxia or hypercapnia: Secondary | ICD-10-CM

## 2015-05-29 DIAGNOSIS — I509 Heart failure, unspecified: Secondary | ICD-10-CM

## 2015-05-29 DIAGNOSIS — R5381 Other malaise: Secondary | ICD-10-CM

## 2015-05-29 LAB — COMPREHENSIVE METABOLIC PANEL
ALT: 129 U/L — ABNORMAL HIGH (ref 14–54)
AST: 156 U/L — AB (ref 15–41)
Albumin: 2.1 g/dL — ABNORMAL LOW (ref 3.5–5.0)
Alkaline Phosphatase: 84 U/L (ref 38–126)
Anion gap: 16 — ABNORMAL HIGH (ref 5–15)
BUN: 63 mg/dL — ABNORMAL HIGH (ref 6–20)
CALCIUM: 8.8 mg/dL — AB (ref 8.9–10.3)
CO2: 32 mmol/L (ref 22–32)
Chloride: 87 mmol/L — ABNORMAL LOW (ref 101–111)
Creatinine, Ser: 1.89 mg/dL — ABNORMAL HIGH (ref 0.44–1.00)
GFR calc Af Amer: 28 mL/min — ABNORMAL LOW (ref >60)
GFR calc non Af Amer: 24 mL/min — ABNORMAL LOW (ref >60)
Glucose, Bld: 316 mg/dL — ABNORMAL HIGH (ref 65–99)
Potassium: 3.3 mmol/L — ABNORMAL LOW (ref 3.5–5.1)
SODIUM: 135 mmol/L (ref 135–145)
TOTAL PROTEIN: 7.1 g/dL (ref 6.5–8.1)
Total Bilirubin: 0.8 mg/dL (ref 0.3–1.2)

## 2015-05-29 LAB — CBC
HCT: 42.9 % (ref 36.0–46.0)
Hemoglobin: 14.4 g/dL (ref 12.0–15.0)
MCH: 28.7 pg (ref 26.0–34.0)
MCHC: 33.6 g/dL (ref 30.0–36.0)
MCV: 85.6 fL (ref 78.0–100.0)
Platelets: 240 10*3/uL (ref 150–400)
RBC: 5.01 MIL/uL (ref 3.87–5.11)
RDW: 15.8 % — AB (ref 11.5–15.5)
WBC: 23.3 10*3/uL — AB (ref 4.0–10.5)

## 2015-05-29 LAB — GLUCOSE, CAPILLARY
GLUCOSE-CAPILLARY: 376 mg/dL — AB (ref 65–99)
Glucose-Capillary: 305 mg/dL — ABNORMAL HIGH (ref 65–99)
Glucose-Capillary: 180 mg/dL — ABNORMAL HIGH (ref 65–99)
Glucose-Capillary: 192 mg/dL — ABNORMAL HIGH (ref 65–99)
Glucose-Capillary: 213 mg/dL — ABNORMAL HIGH (ref 65–99)

## 2015-05-29 LAB — CK: Total CK: 90 U/L (ref 38–234)

## 2015-05-29 LAB — BRAIN NATRIURETIC PEPTIDE: B NATRIURETIC PEPTIDE 5: 227.9 pg/mL — AB (ref 0.0–100.0)

## 2015-05-29 MED ORDER — PREDNISONE 20 MG PO TABS: 20.0000 mg | ORAL_TABLET | Freq: Every day | ORAL | Status: DC

## 2015-05-29 MED ORDER — PREDNISONE 10 MG PO TABS: 10.0000 mg | ORAL_TABLET | Freq: Every day | ORAL | Status: DC

## 2015-05-29 MED ORDER — INSULIN GLARGINE 100 UNIT/ML ~~LOC~~ SOLN
5.0000 [IU] | Freq: Once | SUBCUTANEOUS | Status: AC
  Administered 2015-05-29: 5 [IU] via SUBCUTANEOUS
  Filled 2015-05-29: qty 0.05

## 2015-05-29 MED ORDER — INSULIN GLARGINE 100 UNIT/ML ~~LOC~~ SOLN
35.0000 [IU] | Freq: Every day | SUBCUTANEOUS | Status: DC
  Administered 2015-05-30 – 2015-05-31 (×2): 35 [IU] via SUBCUTANEOUS
  Filled 2015-05-29 (×2): qty 0.35

## 2015-05-29 MED ORDER — FAMOTIDINE IN NACL 20-0.9 MG/50ML-% IV SOLN: 20.0000 mg | INTRAVENOUS | Status: DC

## 2015-05-29 MED ORDER — FLUCONAZOLE 100 MG PO TABS
100.0000 mg | ORAL_TABLET | Freq: Every day | ORAL | Status: DC
  Administered 2015-05-29 – 2015-05-31 (×3): 100 mg via ORAL
  Filled 2015-05-29 (×3): qty 1

## 2015-05-29 NOTE — Progress Notes (Signed)
[facility] PROGRESS NOTE  [name] ZOX:[id number] DOB: 1931/02/16 DOA: 05/24/2015 PCP: [name], MD  Interim summary 81 year old with PMH significant for HTN, OSA, Diabetes A fib, HF who presents with dyspnea, respiratory failure. Found to have PNA and Heart failure exacerbation. Hospital course complicated by angioedema.   Assessment/Plan:   Acute Respiratory Failure; likely related to PNA.  HIV negative, legionella and strep antigen were never sent.  Influenza panel PCR negative,.  Patient was started on levofloxacin but developed allergic reaction/angioedema-discontinued and switched to clindamycin  Patient was DNR, but under current circumstance and likely reversible cause of respiratory failure secondary to angioedema, patient has changed Code status to full code for now.  Improving. Repeat ABG 6/1 reviewed. PH 7.45, PCO2 47.6, PO2 67 and oxygen saturation 92% on 4 L oxygen. Incentive spirometry.  PA and lateral chest x-ray in a.m: No pulmonary edema. Left basal atelectasis versus infiltrate. Will need follow-up chest x-ray in 4-6 weeks..  Community-acquired pneumonia - Antibiotic management as above - Blood cultures 2 negative to date - Day 6 antibiotics. DC clindamycin after 6/3 doses.  Angioedema-likely from levofloxacin Treated with IV solumedrol, IV benadryl, Pepcid.  resolved changed IV Solu-Medrol to oral prednisonand has completed 3 days of total steroids. DC steroids and Pepcid.   Acute on Chronic diastolic heart failure - Patient was treated with IV Lasix. Intake and output charting has not been accurate but weight is down. - Cardiology consulting and have switched to oral torsemide - Stable from cardiac standpoint.   Uncontrolled type 2 diabetes mellitus with insulin therapy Patient was changed from 70/30 insulin to Lantus and SSI .  Hemoglobin A1c: 7.9 suggesting poor outpatient control Current worsening likely from steroids-tapering off  rapidly - Increased Lantus to 35 units daily, changed sliding scale to resistant and added mealtime NovoLog - CBGs still in the 300s on 6/2 and hence increased the Lantus from 30-35 units daily, continue mealtime and SSI. DC steroids  Acute on stage III chronic kidney disease Likely from IV Lasix Creatinine was 1.12 on 04/10/15. IV Lasix discontinued and changed to torsemide. Monitor BMP closely to see if it improves.  creatinine stable in the 1.8 range for the last 2 days.    HTN  -Continue home medications - Controlled   Atrial flutter - Controlled ventricular rate. Continue carvedilol -Continue eliquis for anticoagulation  UTI;  - urine growing E coli. Completed at least 3 days of levofloxacin adjusted to renal function. Treated.  Hyperthyroidism - Continue Tapazole - TSH 4.04  OSA on CPAP - Continue nocturnal CPAP using home supplies  Oral Trush &? Esophageal candidiasis;  Started fluconazole 6/1 - Much better. Requested pharmacy to adjust dose as needed.   Abnormal LFTs - Statins on hold.? Related to acute illness. LFTs unchanged. CK has normalized.  Dyslipidemia - Statins on hold secondary to abnormal LFTs  CAD - Stable. No aspirin while on anticoagulation. Continue beta blockers  No Anemia and thrombocytopenia - probably a lab error. Hemoglobin and platelets are normal.   Altered mental status/? Acute encephalopathy - Noted by nursing 6/1. Patient was coherent 6/1 morning but gradually became slightly confused as day progressed, according to nursing. No focal deficits. ABG does not show significant CO2 retention. Unclear etiology. Have cut back on steroids already. Only new medication started his fluconazole. Monitor closely. - Patient fully coherent this morning.? Sundowning. If has recurrent/worsening episode of confusion then may consider CT head.  DVT prophylaxis: On full anticoagulation with apixaban Code  Status: Full Family Communication: None at  bedside Disposition Plan: DC home when medically stable-possibly in the next 1-2 days. PT recommends CIR consult-requested.   Consultants:  Cardiology  Procedures:  none  Antibiotics:  Levaquin stopped 5-31 concern for allergic reaction.   clindamycin  HPI/Subjective: States that her mouth/lip swelling, tongue swelling, difficulty swallowing have resolved. States that sore mouth and painful swallowing have significantly improved. Complains of poor appetite and fatigue.   Objective: Filed Vitals:   05/29/15 0619 05/29/15 0731 05/29/15 1041 05/29/15 1321  BP:  115/74  125/72  Pulse:  81    Temp:  97.9 F (36.6 C)    TempSrc:  Oral    Resp:  21 25   Weight: 78.336 kg (172 lb 11.2 oz)     SpO2:  98% 91%       Intake/Output Summary (Last 24 hours) at 05/29/15 1404 Last data filed at 05/29/15 1336  Gross per 24 hour  Intake    800 ml  Output   1550 ml  Net   -750 ml   Filed Weights   05/27/15 0526 05/28/15 0400 05/29/15 0619  Weight: 84.641 kg (186 lb 9.6 oz) 78.472 kg (173 lb) 78.336 kg (172 lb 11.2 oz)    Exam:   General:  Pleasant elderly female lying comfortably supine in bed this morning   Cardiovascular: S1 and S2 heard, irregularly irregular. Telemetry shows atrial flutter with controlled ventricular rate. No JVD, murmurs or pedal edema.  Respiratory: Slightly diminished breath sounds in the bases but otherwise clear to auscultation.  Abdomen: BS present, soft, nt  Musculoskeletal: no edema.    ENT; extensive oral thrush on tongue and buccal mucosa-much improved . Otherwise no acute findings appreciated.  [place]: Alert and oriented 3. No focal deficits.  Data Reviewed: Basic Metabolic Panel:  Recent Labs Lab 05/25/15 0344 05/26/15 0323 05/27/15 0245 05/28/15 0805 05/28/15 1402 05/29/15 0820  NA 135 137 132*  --  133* 135  K 4.0 4.6 4.3  --  3.5 3.3*  CL 99* 96* 96*  --  89* 87*  CO2 26 29 26   --  31 32  GLUCOSE 327* 288* 216* 493* 367*  316*  BUN 24* 34* 40*  --  55* 63*  CREATININE 1.46* 1.88* 1.69*  --  1.86* 1.89*  CALCIUM 8.6* 8.8* 8.5*  --  9.1 8.8*   Liver Function Tests:  Recent Labs Lab 05/26/15 0323 05/29/15 0820  AST 159* 156*  ALT 72* 129*  ALKPHOS 58 84  BILITOT 0.9 0.8  PROT 7.1 7.1  ALBUMIN 2.3* 2.1*   No results for input(s): LIPASE, AMYLASE in the last 168 hours. No results for input(s): AMMONIA in the last 168 hours. CBC:  Recent Labs Lab 05/23/15 1457 05/24/15 1120 05/25/15 0344 05/26/15 0323 05/27/15 0245 05/29/15 0820  WBC 14.4* 13.0* 14.4* 13.0* 11.0* 23.3*  NEUTROABS 13.4* 12.0* 13.6*  --   --   --   HGB 13.7 13.6 12.5 13.0 11.9* 14.4  HCT 42.0 41.8 38.8 40.6 37.1 42.9  MCV 88.7 89.1 88.8 89.4 87.9 85.6  PLT 136.0* 136* 129* 130* 145* 240   Cardiac Enzymes:  Recent Labs Lab 05/23/15 1457 05/29/15 0820  CKTOTAL 779* 90   BNP (last 3 results)  Recent Labs  04/10/15 1305 05/24/15 1120 05/29/15 0820  BNP 165.4* 400.8* 227.9*    ProBNP (last 3 results) No results for input(s): PROBNP in the last 8760 hours.  CBG:  Recent Labs Lab 05/28/15 1144  05/28/15 1643 05/28/15 2028 05/29/15 0730 05/29/15 1127  GLUCAP 418* 245* 176* 305* 376*    Recent Results (from the past 240 hour(s))  Urine culture     Status: None   Collection Time: 05/24/15 12:38 PM  Result Value Ref Range Status   Specimen Description URINE, RANDOM  Final   Special Requests NONE  Final   Colony Count   Final    >=100,000 COLONIES/ML Performed at [facility]    Culture   Final    ESCHERICHIA COLI Performed at [facility]    Report Status 05/26/2015 FINAL  Final   Organism ID, Bacteria ESCHERICHIA COLI  Final      Susceptibility   Escherichia coli - MIC*    AMPICILLIN <=2 SENSITIVE Sensitive     CEFAZOLIN <=4 SENSITIVE Sensitive     CEFTRIAXONE <=1 SENSITIVE Sensitive     CIPROFLOXACIN <=0.25 SENSITIVE Sensitive     GENTAMICIN <=1 SENSITIVE Sensitive      LEVOFLOXACIN <=0.12 SENSITIVE Sensitive     NITROFURANTOIN <=16 SENSITIVE Sensitive     TOBRAMYCIN <=1 SENSITIVE Sensitive     TRIMETH/SULFA <=20 SENSITIVE Sensitive     PIP/TAZO <=4 SENSITIVE Sensitive     * ESCHERICHIA COLI  Culture, blood (routine x 2) Call MD if unable to obtain prior to antibiotics being given     Status: None (Preliminary result)   Collection Time: 05/24/15  4:29 PM  Result Value Ref Range Status   Specimen Description BLOOD LEFT ARM  Final   Special Requests BOTTLES DRAWN AEROBIC ONLY 10CC  Final   Culture   Final           BLOOD CULTURE RECEIVED NO GROWTH TO DATE CULTURE WILL BE HELD FOR 5 DAYS BEFORE ISSUING A FINAL NEGATIVE REPORT Performed at [facility]    Report Status PENDING  Incomplete  Culture, blood (routine x 2) Call MD if unable to obtain prior to antibiotics being given     Status: None (Preliminary result)   Collection Time: 05/24/15  4:33 PM  Result Value Ref Range Status   Specimen Description BLOOD LEFT HAND  Final   Special Requests BOTTLES DRAWN AEROBIC ONLY 10CC  Final   Culture   Final           BLOOD CULTURE RECEIVED NO GROWTH TO DATE CULTURE WILL BE HELD FOR 5 DAYS BEFORE ISSUING A FINAL NEGATIVE REPORT Performed at [facility]    Report Status PENDING  Incomplete     Studies: Dg Chest 2 View  05/29/2015   CLINICAL DATA:  Shortness of breath, congestive heart failure  EXAM: CHEST  2 VIEW  COMPARISON:  05/25/2015  FINDINGS: Cardiomediastinal silhouette is stable. No pulmonary edema. There is hazy left basilar atelectasis or infiltrate best seen on lateral view. Mild degenerative changes thoracic spine.  IMPRESSION: No pulmonary edema. Hazy left basilar atelectasis or infiltrate best seen on lateral view.   Electronically Signed   By: Lahoma [name] M.D.   On: 05/29/2015 09:06    Scheduled Meds: . antiseptic oral rinse  7 mL Mouth Rinse BID  . apixaban  2.5 mg Oral BID  . carvedilol  12.5 mg Oral BID WC  . clindamycin  (CLEOCIN) IV  600 mg Intravenous 3 times per day  . [START ON 05/30/2015] famotidine (PEPCID) IV  20 mg Intravenous Q24H  . fluconazole  100 mg Oral Daily  . insulin aspart  0-20 Units Subcutaneous TID WC  . insulin  aspart  0-5 Units Subcutaneous QHS  . insulin aspart  4 Units Subcutaneous TID WC  . insulin glargine  30 Units Subcutaneous Daily  . ipratropium  0.5 mg Nebulization TID  . levalbuterol  0.63 mg Nebulization TID  . methimazole  5 mg Oral Once per day on Mon Wed Fri  . [START ON 05/30/2015] predniSONE  20 mg Oral Q breakfast   Followed by  . [START ON 05/31/2015] predniSONE  10 mg Oral Q breakfast  . torsemide  40 mg Oral Daily   Continuous Infusions:    Principal Problem:   Acute respiratory failure with hypoxia Active Problems:   Uncontrolled type 2 diabetes mellitus with insulin therapy   HYPERLIPIDEMIA   Iron deficiency anemia   HTN (hypertension)   Atrial flutter   Hyperthyroidism   Pulmonary HTN   Chronic diastolic heart failure   OSA on CPAP   CAP (community acquired pneumonia)   Dehydration   Abnormal urinalysis   Sepsis   Acute on chronic diastolic CHF (congestive heart failure)   Acute encephalopathy   Oral thrush   Acute renal failure superimposed on stage 3 chronic kidney disease    Time spent: 35 minutes.    Vernell Leep, MD, FACP, FHM. [facility] Pager [phone]  If 7PM-7AM, please contact night-coverage [email or web address] Password [email or web address] 05/29/2015, 2:04 PM   LOS: 5 days

## 2015-05-29 NOTE — Evaluation (Signed)
Physical Therapy Evaluation Patient Details Name: [name] MRN: [id number] DOB: 1931/08/17 Today's Date: 05/29/2015   History of Present Illness  This is a 81 yo female, PMH of : HTN pulm ,HTN, OSA on CPAP, DM on insulin, hyperthyroidism on Tapazole, chronic DHF and atrial flutter on Eliquis. Initially presented to PCP 5/27 after 2 days of SOB/DOE with NP cough which was associated with myalgias and nausea. Felt to have acute DHF exac so home Lasix doubled w/o improvement in sx's. PCP instructed pt to come to ER today. Found with leukocytosis, hypoxia and CXR with LLL haziness. CT chest w/o contrast confirmed LLL PNA.   Clinical Impression  Patient presents with decreased mobility due to deficits listed in PT problem list.  She will benefit from skilled PT in the acute setting to allow return home with intermittent family assist after CIR level rehab stay.  Though patient with intermittent confusion, this is not her baseline.  Mobilizing relatively well, but severely limited activity tolerance and not safe for d/c home till cognition consistently clear.    Follow Up Recommendations CIR    Equipment Recommendations  Rolling walker with 5" wheels    Recommendations for Other Services       Precautions / Restrictions Precautions Precautions: Fall      Mobility  Bed Mobility Overal bed mobility: Needs Assistance Bed Mobility: Supine to Sit     Supine to sit: HOB elevated;Mod assist     General bed mobility comments: assist for legs off edge of bed and to lift trunk upright.  Stopping to rest after each movement and strength test to catch breath with cues for technique  Transfers Overall transfer level: Needs assistance Equipment used: Rolling walker (2 wheeled) Transfers: Sit to/from Stand Sit to Stand: Mod assist;From elevated surface         General transfer comment: pull up on walker, tends to hyperextend trunk in standing due to lumbar/hip weakness (reports was  bent over at times previously)  Ambulation/Gait Ambulation/Gait assistance: Min assist;+2 safety/equipment (sitter pushed IV/O2) Ambulation Distance (Feet): 22 Feet Assistive device: Rolling walker (2 wheeled) Gait Pattern/deviations: Step-through pattern;Decreased stride length   Gait velocity interpretation: Below normal speed for age/gender General Gait Details: managing walker well on turn, demonstrates fatigue quickly and sat to rest in recliner mod assist, cues for safety   Stairs            Wheelchair Mobility    Modified Rankin (Stroke Patients Only)       Balance Overall balance assessment: Needs assistance Sitting-balance support: Feet supported Sitting balance-Leahy Scale: Fair Sitting balance - Comments: reports difficult to keep her balance at edge of bed without UE support, but sat for about 2 minutes supervision   Standing balance support: Bilateral upper extremity supported Standing balance-Leahy Scale: Poor                               Pertinent Vitals/Pain Pain Assessment: Faces Faces Pain Scale: Hurts little more Pain Location: right knee with ambulation. Pain Intervention(s): Monitored during session    Home Living Family/patient expects to be discharged to:: Private residence Living Arrangements: Children (son) Available Help at Discharge: Available PRN/intermittently Type of Home: Apartment Home Access: Level entry     Home Layout: One level Home Equipment: Cane - single point Additional Comments: Son is a Therapist, nutritional and occasionally goes out at night    Prior Function Level of Independence: Independent;Independent with  assistive device(s)         Comments: started using cane due to needs TKA right knee     Hand Dominance   Dominant Hand: Right    Extremity/Trunk Assessment   Upper Extremity Assessment: RUE deficits/detail RUE Deficits / Details: limited due to pain, moves below shoulder level well; did not give  resistance due to pain; left UE AROM/strength [facility]         Lower Extremity Assessment: RLE deficits/detail RLE Deficits / Details: AAROM WFL strength grossly 4-/5; audible crepitus right knee with ambulation.       Communication   Communication: No difficulties  Cognition Arousal/Alertness: Awake/alert Behavior During Therapy: WFL for tasks assessed/performed Overall Cognitive Status: Impaired/Different from baseline Area of Impairment: Memory;Following commands     Memory: Decreased short-term memory Following Commands: Follows one step commands with increased time;Follows one step commands inconsistently       General Comments: trouble recalling words at times     General Comments      Exercises        Assessment/Plan    PT Assessment Patient needs continued PT services  PT Diagnosis Generalized weakness;Abnormality of gait;Altered mental status   PT Problem List Decreased strength;Decreased safety awareness;Decreased mobility;Decreased balance;Decreased knowledge of use of DME;Pain;Decreased activity tolerance;Cardiopulmonary status limiting activity;Decreased cognition  PT Treatment Interventions DME instruction;Therapeutic exercise;Gait training;Balance training;Functional mobility training;Therapeutic activities;Patient/family education   PT Goals (Current goals can be found in the Care Plan section) Acute Rehab PT Goals Patient Stated Goal: To go to rehab PT Goal Formulation: With patient Time For Goal Achievement: 06/12/15 Potential to Achieve Goals: Good    Frequency Min 3X/week   Barriers to discharge Decreased caregiver support      Co-evaluation               End of Session Equipment Utilized During Treatment: Gait belt;Oxygen Activity Tolerance: Patient limited by fatigue Patient left: in chair;with nursing/sitter in room;with call bell/phone within reach           Time: 0943-1017 PT Time Calculation (min) (ACUTE ONLY): 34  min   Charges:   PT Evaluation $Initial PT Evaluation Tier I: 1 Procedure PT Treatments $Gait Training: 8-22 mins   PT G Codes:        Yehuda Printup,CYNDI 2015/06/16, 10:53 AM  Magda Kiel, [place] 06/16/2015

## 2015-05-29 NOTE — Progress Notes (Signed)
RT placed pt on CPAP at 2345. Pt tolerating well at this time.

## 2015-05-29 NOTE — Discharge Instructions (Signed)

## 2015-05-29 NOTE — Consult Note (Signed)
Physical Medicine and Rehabilitation Consult Reason for Consult: Deconditioning Referring Physician: Dr. Algis Liming   HPI: [name] is a 81 y.o. female with history of CAD, A Fib, OSA, DM type 2 who was admitted on 05/24/15 with progressive SOB with hypoxia and malaise.  She was noted to be dehydrated and lasix was placed on hold. CT chest done revealing LLL PNA and she was started on IV antibiotic for sepsis and concerns of aspiration PNA.  Patient continued to have significant dyspnea past admission and acute on chronic diastolic HF treated with IV diuresis. Cardiology consulted for input and diuretics being adjusted with improvement in volume overload.  PT evaluation done today revealing intermittent confusion as well as deconditioned state. CIR recommended by MD and PT for follow up therapy.   Pt was independent and driving prior to hospitalization, daughter notes pt is confused and asked if it is reversible, has improved MS vs yesterday  Review of Systems  Unable to perform ROS: mental acuity  Respiratory: Positive for shortness of breath and wheezing.   Cardiovascular: Negative for chest pain.  Musculoskeletal: Positive for myalgias, back pain, joint pain and neck pain.  Psychiatric/Behavioral: Positive for memory loss.      Past Medical History  Diagnosis Date  . Hypopotassemia     PMH of  . Anemia     iron defficiency  . Muscle pain   . Atrial fibrillation or flutter     s/p RFCA 7/08;   s/p DCCV in past;   previously on amiodarone;  amio stopped due to lung toxicity  . CAD (coronary artery disease)     s/p NSTEMI tx with BMS to OM1 3/08;  cath 3/08: pOM 99% tx with PCI, pLAD 20%, ? mod stenosis at the AM  . HLD (hyperlipidemia)   . HTN (hypertension)     essential nos  . Dystrophy, corneal stromal   . Chronic diastolic heart failure     echo 11/11:  EF 55-60%, severe LVH, mod LAE, mild MR, mildly increased PASP  . Osteoporosis   . Heart murmur   .  Anginal pain   . Myocardial infarction 2008  . COPD (chronic obstructive pulmonary disease)   . Asthma   . Pneumonia 05/27/2015  . OSA on CPAP   . Type II diabetes mellitus     Dr Loanne Drilling  . Seasonal allergies   . Degenerative joint disease   . Arthritis     "knees, feet, hands; joints" (05/27/2015)    Past Surgical History  Procedure Laterality Date  . Cholecystectomy    . Knee cartilage surgery Right   . Eye surgery    . A flutter ablation      Dr Lovena Le  . Gravid 2 para 2    . Colonoscopy  6/07    2 polyps Dr. Kinnie Feil in [place]  . Coronary angioplasty with stent placement  02/2007    BMS w/Dr Lovena Le  . Colonoscopy with polypectomy       X 2; [facility]  . Left and right heart catheterization with coronary angiogram N/A 05/07/2014    Procedure: LEFT AND RIGHT HEART CATHETERIZATION WITH CORONARY ANGIOGRAM;  Surgeon: Peter M Martinique, MD;  Location: [facility] CATH LAB;  Service: Cardiovascular;  Laterality: N/A;  . Cataract extraction w/ intraocular lens  implant, bilateral Bilateral   . Corneal transplant Bilateral     Family History  Problem Relation Age of Onset  . Diabetes Mother   . Hypertension  Mother   . Transient ischemic attack Mother   . Heart attack Father 39  . Arthritis Father   . Breast cancer Maternal Aunt   . Arthritis Maternal Aunt   . Arthritis Mother   . Hypertension Father      Social History:  reports that she quit smoking about 46 years ago. Her smoking use included Cigarettes. She has a 4.5 pack-year smoking history. She has never used smokeless tobacco. She reports that she does not drink alcohol or use illicit drugs.    Allergies  Allergen Reactions  . Amlodipine Besylate Other (See Comments)    REACTION: tingling in lips & gum edema  . Levaquin [Levofloxacin] Shortness Of Breath and Swelling    angioedema  . Lobster [Shellfish Allergy] Other (See Comments)    angioedema  . Penicillins Rash  . Valsartan Other (See Comments)    REACTION: angioedema    . Codeine Other (See Comments)    Mental status changes  . Tramadol Hcl Nausea And Vomiting    Medications Prior to Admission  Medication Sig Dispense Refill  . apixaban (ELIQUIS) 5 MG TABS tablet Take 1 tablet (5 mg total) by mouth 2 (two) times daily. 60 tablet 6  . carvedilol (COREG) 12.5 MG tablet Take 1 tablet (12.5 mg total) by mouth 2 (two) times daily with a meal. 60 tablet 3  . furosemide (LASIX) 40 MG tablet Take 80 mg by mouth daily.     Marland Kitchen glucose blood (FREESTYLE LITE) test strip Use to check blood sugar 2 time per day. 200 each 2  . Insulin Lispro Prot & Lispro (HUMALOG MIX 75/25 KWIKPEN) (75-25) 100 UNIT/ML Kwikpen Inject 35 Units into the skin every morning. And pen needles 2/day (Patient taking differently: Inject 40 Units into the skin every morning. And pen needles 2/day) 15 mL 11  . Insulin Pen Needle (PEN NEEDLES 31GX5/16") 31G X 8 MM MISC Use to inject insulin 1 times per day. DX Code E11.9 100 each 2  . methimazole (TAPAZOLE) 5 MG tablet Take 5 mg by mouth 3 (three) times a week.     . nitroGLYCERIN (NITROSTAT) 0.4 MG SL tablet Place 0.4 mg under the tongue every 5 (five) minutes as needed for chest pain.    . potassium chloride SA (K-DUR,KLOR-CON) 20 MEQ tablet Take 2 tabs every other day alternating with 1 tab every other day (Patient taking differently: Take 20 mEq by mouth 2 (two) times daily. ) 50 tablet 3  . rosuvastatin (CRESTOR) 20 MG tablet Take 1 tablet (20 mg total) by mouth daily. 90 tablet 1  . tiotropium (SPIRIVA) 18 MCG inhalation capsule Place 1 capsule (18 mcg total) into inhaler and inhale daily. 90 capsule 3    Home: Home Living Family/patient expects to be discharged to:: Private residence Living Arrangements: Children (son) Available Help at Discharge: Available PRN/intermittently Type of Home: Apartment Home Access: Level entry Home Layout: One level Home Equipment: Cane - single point Additional Comments: Son is a Therapist, nutritional and occasionally  goes out at night  Functional History: Prior Function Level of Independence: Independent, Independent with assistive device(s) Comments: started using cane due to needs TKA right knee Functional Status:  Mobility: Bed Mobility Overal bed mobility: Needs Assistance Bed Mobility: Supine to Sit Supine to sit: HOB elevated, Mod assist General bed mobility comments: assist for legs off edge of bed and to lift trunk upright.  Stopping to rest after each movement and strength test to catch breath with cues for technique  Transfers Overall transfer level: Needs assistance Equipment used: Rolling walker (2 wheeled) Transfers: Sit to/from Stand Sit to Stand: Mod assist, From elevated surface General transfer comment: pull up on walker, tends to hyperextend trunk in standing due to lumbar/hip weakness (reports was bent over at times previously) Ambulation/Gait Ambulation/Gait assistance: Min assist, +2 safety/equipment (sitter pushed IV/O2) Ambulation Distance (Feet): 22 Feet Assistive device: Rolling walker (2 wheeled) General Gait Details: managing walker well on turn, demonstrates fatigue quickly and sat to rest in recliner mod assist, cues for safety  Gait Pattern/deviations: Step-through pattern, Decreased stride length Gait velocity interpretation: Below normal speed for age/gender    ADL:    Cognition: Cognition Overall Cognitive Status: Impaired/Different from baseline Orientation Level: Oriented to person, Oriented to place, Oriented to time, Disoriented to situation Cognition Arousal/Alertness: Awake/alert Behavior During Therapy: WFL for tasks assessed/performed Overall Cognitive Status: Impaired/Different from baseline Area of Impairment: Memory, Following commands Memory: Decreased short-term memory Following Commands: Follows one step commands with increased time, Follows one step commands inconsistently General Comments: trouble recalling words at times   Blood pressure  125/72, pulse 81, temperature 97.9 F (36.6 C), temperature source Oral, resp. rate 25, weight 78.336 kg (172 lb 11.2 oz), SpO2 91 %. Physical Exam  Nursing note and vitals reviewed. Constitutional: She appears well-developed and well-nourished. Nasal cannula in place.  Sitter in room for fall prevention.   HENT:  Head: Normocephalic and atraumatic.  Eyes: Conjunctivae are normal.  Neck: Neck supple.  Cardiovascular: Normal rate and regular rhythm.   Respiratory: Effort normal. No respiratory distress. She has wheezes (audible).  GI: Soft. Bowel sounds are normal. She exhibits no distension. There is no tenderness.  Musculoskeletal: She exhibits no edema or tenderness.  Neurological: She is alert.  Disoriented and tangential. Needed cues to recall situation and needed redirection to help with memory. Moves all four extremities.   Skin: Skin is warm and dry.  Psychiatric: Her speech is tangential. Cognition and memory are impaired. She expresses impulsivity. She is inattentive.  Motor is 4/5 bilateral delt, biceps, triceps, grip, Hip flx, Knee ext, ankle DF Sensation intact to LT in BUE and BLE Some hamstring tightness in RLE  Results for orders placed or performed during the hospital encounter of 05/24/15 (from the past 24 hour(s))  Glucose, capillary     Status: Abnormal   Collection Time: 05/28/15  4:43 PM  Result Value Ref Range   Glucose-Capillary 245 (H) 65 - 99 mg/dL  Blood gas, arterial     Status: Abnormal   Collection Time: 05/28/15  5:28 PM  Result Value Ref Range   O2 Content 4.0 L/min   Delivery systems NASAL CANNULA    pH, Arterial 7.454 (H) 7.350 - 7.450   pCO2 arterial 47.6 (H) 35.0 - 45.0 mmHg   pO2, Arterial 66.9 (L) 80.0 - 100.0 mmHg   Bicarbonate 32.8 (H) 20.0 - 24.0 mEq/L   TCO2 34.3 0 - 100 mmol/L   Acid-Base Excess 8.6 (H) 0.0 - 2.0 mmol/L   O2 Saturation 91.9 %   Patient temperature 98.6    Collection site LEFT RADIAL    Drawn by [id number]    Sample type  ARTERIAL    Allens test (pass/fail) PASS PASS  Glucose, capillary     Status: Abnormal   Collection Time: 05/28/15  8:28 PM  Result Value Ref Range   Glucose-Capillary 176 (H) 65 - 99 mg/dL  Glucose, capillary     Status: Abnormal   Collection Time: 05/29/15  7:30 AM  Result Value Ref Range   Glucose-Capillary 305 (H) 65 - 99 mg/dL  Brain natriuretic peptide     Status: Abnormal   Collection Time: 05/29/15  8:20 AM  Result Value Ref Range   B Natriuretic Peptide 227.9 (H) 0.0 - 100.0 pg/mL  Comprehensive metabolic panel     Status: Abnormal   Collection Time: 05/29/15  8:20 AM  Result Value Ref Range   Sodium 135 135 - 145 mmol/L   Potassium 3.3 (L) 3.5 - 5.1 mmol/L   Chloride 87 (L) 101 - 111 mmol/L   CO2 32 22 - 32 mmol/L   Glucose, Bld 316 (H) 65 - 99 mg/dL   BUN 63 (H) 6 - 20 mg/dL   Creatinine, Ser 1.89 (H) 0.44 - 1.00 mg/dL   Calcium 8.8 (L) 8.9 - 10.3 mg/dL   Total Protein 7.1 6.5 - 8.1 g/dL   Albumin 2.1 (L) 3.5 - 5.0 g/dL   AST 156 (H) 15 - 41 U/L   ALT 129 (H) 14 - 54 U/L   Alkaline Phosphatase 84 38 - 126 U/L   Total Bilirubin 0.8 0.3 - 1.2 mg/dL   GFR calc non Af Amer 24 (L) >60 mL/min   GFR calc Af Amer 28 (L) >60 mL/min   Anion gap 16 (H) 5 - 15  CK     Status: None   Collection Time: 05/29/15  8:20 AM  Result Value Ref Range   Total CK 90 38 - 234 U/L  CBC     Status: Abnormal   Collection Time: 05/29/15  8:20 AM  Result Value Ref Range   WBC 23.3 (H) 4.0 - 10.5 K/uL   RBC 5.01 3.87 - 5.11 MIL/uL   Hemoglobin 14.4 12.0 - 15.0 g/dL   HCT 42.9 36.0 - 46.0 %   MCV 85.6 78.0 - 100.0 fL   MCH 28.7 26.0 - 34.0 pg   MCHC 33.6 30.0 - 36.0 g/dL   RDW 15.8 (H) 11.5 - 15.5 %   Platelets 240 150 - 400 K/uL  Glucose, capillary     Status: Abnormal   Collection Time: 05/29/15 11:27 AM  Result Value Ref Range   Glucose-Capillary 376 (H) 65 - 99 mg/dL   Dg Chest 2 View  05/29/2015   CLINICAL DATA:  Shortness of breath, congestive heart failure  EXAM: CHEST  2  VIEW  COMPARISON:  05/25/2015  FINDINGS: Cardiomediastinal silhouette is stable. No pulmonary edema. There is hazy left basilar atelectasis or infiltrate best seen on lateral view. Mild degenerative changes thoracic spine.  IMPRESSION: No pulmonary edema. Hazy left basilar atelectasis or infiltrate best seen on lateral view.   Electronically Signed   By: Lahoma Crocker M.D.   On: 05/29/2015 09:06    Assessment/Plan: Diagnosis:  Deconditioning after LLL pneumonia and acute on chronic diastolic CHF 1. Does the need for close, 24 hr/day medical supervision in concert with the patient's rehab needs make it unreasonable for this patient to be served in a less intensive setting? Yes 2. Co-Morbidities requiring supervision/potential complications: CAD, acute on chronic renal insufficiency, HTN , Diabetes type 2, hx of Afib 3. Due to bladder management, bowel management, safety, skin/wound care, disease management, medication administration, pain management and patient education, does the patient require 24 hr/day rehab nursing? Yes 4. Does the patient require coordinated care of a physician, rehab nurse, PT (1-2 hrs/day, 5 days/week), OT (1-2 hrs/day, 5 days/week) and SLP (.5-1 hrs/day, 5 days/week) to address physical and  functional deficits in the context of the above medical diagnosis(es)? Yes Addressing deficits in the following areas: balance, endurance, locomotion, strength, transferring, bowel/bladder control, bathing, dressing, feeding, grooming, toileting, cognition, speech, language, swallowing and ROM 5. Can the patient actively participate in an intensive therapy program of at least 3 hrs of therapy per day at least 5 days per week? Yes 6. The potential for patient to make measurable gains while on inpatient rehab is good 7. Anticipated functional outcomes upon discharge from inpatient rehab are modified independent and supervision  with PT, modified independent and supervision with OT, supervision with  SLP. 8. Estimated rehab length of stay to reach the above functional goals is: 7-10days 9. Does the patient have adequate social supports and living environment to accommodate these discharge functional goals? Yes 10. Anticipated D/C setting: Home 11. Anticipated post D/C treatments: [facility] therapy 12. Overall Rehab/Functional Prognosis: excellent  RECOMMENDATIONS: This patient's condition is appropriate for continued rehabilitative care in the following setting: CIR Patient has agreed to participate in recommended program. Yes Note that insurance prior authorization may be required for reimbursement for recommended care.  Comment:     05/29/2015

## 2015-05-29 NOTE — Progress Notes (Signed)
Rehab Admissions Coordinator Note:  Patient was screened by Retta Diones for appropriateness for an Inpatient Acute Rehab Consult.  At this time, we are recommending Inpatient Rehab consult.  Jodell Cipro M 05/29/2015, 11:41 AM  I can be reached at [phone].

## 2015-05-29 NOTE — Progress Notes (Signed)
Patient has left nasal cannula in place, resting in bed at this time.

## 2015-05-29 NOTE — Progress Notes (Signed)
Patient constantly fidgeting with CPAP mask, would not leave in place on face.  CPAP stopped and patient switched back to nasal cannula.  Will continue to monitor.

## 2015-05-29 NOTE — Progress Notes (Signed)
ANTIBIOTIC CONSULT NOTE  Pharmacy Consult for fluconazole Indication: Esophageal candidiasis  Allergies  Allergen Reactions  . Amlodipine Besylate Other (See Comments)    REACTION: tingling in lips & gum edema  . Levaquin [Levofloxacin] Shortness Of Breath and Swelling    angioedema  . Lobster [Shellfish Allergy] Other (See Comments)    angioedema  . Penicillins Rash  . Valsartan Other (See Comments)    REACTION: angioedema  . Codeine Other (See Comments)    Mental status changes  . Tramadol Hcl Nausea And Vomiting    Patient Measurements: Weight 84.4 kg on 04/28/15 Height 160 cm on 04/28/15 Vital Signs: Temp: 97.9 F (36.6 C) (06/02 0731) Temp Source: Oral (06/02 0731) BP: 115/74 mmHg (06/02 0731) Pulse Rate: 81 (06/02 0731) Labs:  Recent Labs  05/27/15 0245 05/28/15 1402 05/29/15 0820  WBC 11.0*  --  23.3*  HGB 11.9*  --  14.4  PLT 145*  --  240  CREATININE 1.69* 1.86* 1.89*   Estimated Creatinine Clearance: 23.1 mL/min (by C-G formula based on Cr of 1.89). No results for input(s): VANCOTROUGH, VANCOPEAK, VANCORANDOM, GENTTROUGH, GENTPEAK, GENTRANDOM, TOBRATROUGH, TOBRAPEAK, TOBRARND, AMIKACINPEAK, AMIKACINTROU, AMIKACIN in the last 72 hours.   Microbiology:   Medical History: Past Medical History  Diagnosis Date  . Hypopotassemia     PMH of  . Anemia     iron defficiency  . Muscle pain   . Atrial fibrillation or flutter     s/p RFCA 7/08;   s/p DCCV in past;   previously on amiodarone;  amio stopped due to lung toxicity  . CAD (coronary artery disease)     s/p NSTEMI tx with BMS to OM1 3/08;  cath 3/08: pOM 99% tx with PCI, pLAD 20%, ? mod stenosis at the AM  . HLD (hyperlipidemia)   . HTN (hypertension)     essential nos  . Dystrophy, corneal stromal   . Chronic diastolic heart failure     echo 11/11:  EF 55-60%, severe LVH, mod LAE, mild MR, mildly increased PASP  . Osteoporosis   . Heart murmur   . Anginal pain   . Myocardial infarction 2008  .  COPD (chronic obstructive pulmonary disease)   . Asthma   . Pneumonia 05/27/2015  . OSA on CPAP   . Type II diabetes mellitus     Dr Loanne Drilling  . Seasonal allergies   . Degenerative joint disease   . Arthritis     "knees, feet, hands; joints" (05/27/2015)    Medications:  Anti-infectives    Start     Dose/Rate Route Frequency Ordered Stop   05/29/15 1000  fluconazole (DIFLUCAN) tablet 100 mg     100 mg Oral Daily 05/29/15 0928     05/28/15 1015  fluconazole (DIFLUCAN) IVPB 200 mg  Status:  Discontinued     200 mg 100 mL/hr over 60 Minutes Intravenous Every 24 hours 05/28/15 1014 05/29/15 0928   05/28/15 0600  clindamycin (CLEOCIN) IVPB 600 mg     600 mg 100 mL/hr over 30 Minutes Intravenous 3 times per day 05/27/15 1429     05/26/15 1630  levofloxacin (LEVAQUIN) IVPB 750 mg  Status:  Discontinued     750 mg 100 mL/hr over 90 Minutes Intravenous Every 48 hours 05/25/15 0653 05/27/15 0757   05/24/15 1530  levofloxacin (LEVAQUIN) IVPB 750 mg  Status:  Discontinued     750 mg 100 mL/hr over 90 Minutes Intravenous Every 48 hours 05/24/15 1527 05/24/15 1850  Assessment: 81 year old female with oral Trush &? Esophageal candidiasis with multiple contributing factors.  ID: PNA on Levaquin>>Clinda. WBC 11>23.3, SCr 1.89 with CrCl 23.  Anticoag: Patient also receiving apixaban for afib. Was taking 5mg  bid pta. Given AKI and bump in scr today will reduce dose to 2.5mg  bid. (age>80, scr>1.5). I would continue with this dose until we see a significant improvement in scr.  Goal of Therapy:  Clinical resolution of infection  Plan:  Fluconazole 200mg  IV x1 on 6/1 then reduce to 100mg  daily po (recommended 21 days) Continue apixaban to 2.5mg  bid  Pietro Bonura S. Alford Highland, PharmD, [facility] Clinical Staff Pharmacist Pager [phone]   05/29/2015 9:31 AM

## 2015-05-29 NOTE — Progress Notes (Signed)
Advanced Heart Failure Rounding Note   Subjective:     Admitted with dyspnea/PNA and volume overload. Diuresing with IV Lasix. Held 5/30 due to Cr bump but resumed 5/31. Yesterday IV lasix stopped and torsemide started.   Overall feeling better. Ongoing fatigue.    Objective:    Telemetry: coarse atrial fib, rate controlled Physical Exam: Blood pressure 115/74, pulse 81, temperature 97.9 F (36.6 C), temperature source Oral, resp. rate 21, weight 172 lb 11.2 oz (78.336 kg), SpO2 98 %. General: Well developed, well nourished F, in no acute distress. Head: Normocephalic, atraumatic, sclera non-icteric, no xanthomas, nares are without discharge. Neck: JVP not elevated. Lungs: Diminished at bases with crackles in the left base. No wheezes or rhonchi. Breathing is unlabored. Heart: Irregularly irregular, rate controlled, S1 S2 without murmurs, rubs, or gallops.  Abdomen: Soft, non-tender, non-distended with normoactive bowel sounds. No rebound/guarding. Extremities: No clubbing or cyanosis. No edema. Distal pedal pulses are 2+ and equal bilaterally. Neuro: Alert and oriented X 3. Moves all extremities spontaneously. Psych:  Responds to questions appropriately with a normal affect.   Intake/Output Summary (Last 24 hours) at 05/29/15 0847 Last data filed at 05/29/15 0620  Gross per 24 hour  Intake    760 ml  Output   1500 ml  Net   -740 ml    Weight change: Filed Weights   05/27/15 0526 05/28/15 0400 05/29/15 0619  Weight: 186 lb 9.6 oz (84.641 kg) 173 lb (78.472 kg) 172 lb 11.2 oz (78.336 kg)    Labs: @LABRCNTIP (na:5,k:5,cl:5,co2:5,glucose:5,bun:5,creatinine:5,calcium:3,mg:5,phos:5 )  Recent Labs Lab 05/26/15 0323  AST 159*  ALT 72*  ALKPHOS 58  BILITOT 0.9  PROT 7.1  ALBUMIN 2.3*    Recent Labs Lab 05/23/15 1457 05/24/15 1120 05/25/15 0344 05/26/15 0323 05/27/15 0245  WBC 14.4* 13.0* 14.4* 13.0* 11.0*  NEUTROABS 13.4* 12.0* 13.6*  --   --   HGB 13.7 13.6  12.5 13.0 11.9*  HCT 42.0 41.8 38.8 40.6 37.1  MCV 88.7 89.1 88.8 89.4 87.9  PLT 136.0* 136* 129* 130* 145*    Recent Labs Lab 05/23/15 1457  CKTOTAL 779*   Invalid input(s): BNPLAST3 No results for input(s): HGBA1C in the last 72 hours.   Radiology/Studies:  Dg Chest 2 View  05/24/2015   CLINICAL DATA:  Dizziness, weakness, inability to walk, high blood sugarHx of congestion heart failure, DM, HTN, CAD, myocardial infarction  EXAM: CHEST  2 VIEW  COMPARISON:  05/23/2015  FINDINGS: Cardiac silhouette borderline enlarged but normal in configuration. No mediastinal or hilar masses or convincing adenopathy.  Lungs are clear.  No pleural effusion or pneumothorax.  Bony thorax is intact.  IMPRESSION: No acute cardiopulmonary disease.   Electronically Signed   By: Lajean Manes M.D.   On: 05/24/2015 11:40   Dg Chest 2 View  05/23/2015   CLINICAL DATA:  Shortness of breath.  EXAM: CHEST  2 VIEW  COMPARISON:  05/21/2014.  FINDINGS: Mediastinum and hilar structures normal. Cardiomegaly with bilateral pulmonary interstitial prominence consistent with congestive heart failure. Pneumonitis cannot be excluded. No pleural effusion or pneumothorax. Diffuse osteopenia with multiple mild stable thoracic spine compression fractures.  IMPRESSION: Cardiomegaly with bilateral pulmonary interstitial prominence consistent with congestive heart failure. Bilateral pneumonitis cannot be excluded.   Electronically Signed   By: Marcello Moores  Register   On: 05/23/2015 15:23   Ct Chest Wo Contrast  05/24/2015   CLINICAL DATA:  Shortness of breath and generalized weakness.  EXAM: CT CHEST WITHOUT CONTRAST  TECHNIQUE: Multidetector CT  imaging of the chest was performed following the standard protocol without IV contrast.  COMPARISON:  Chest x-ray today as well as CT 12/24/2008  FINDINGS: Lungs are adequately inflated and demonstrate moderate airspace consolidation over the left lower lobe likely a pneumonia. Subtle linear  atelectasis/scarring over the right lower lobe. Mild centrilobular emphysematous disease over the mid to upper lungs. Airways are within normal.  Mild prominence of the heart. There is a tiny amount of pericardial fluid present. Evidence of 3 vessel atherosclerotic coronary artery disease. No significant mediastinal, hilar or axillary adenopathy. Moderate calcified plaque over the thoracic aorta. Fluid within the distal esophagus which may be due to reflux versus dysmotility.  Images through the upper abdomen demonstrate focal calcification over the dome of the liver and right lobe the liver unchanged. There are degenerative changes of the spine.  IMPRESSION: Moderate airspace consolidation over the left lower lobe compatible with a pneumonia. Recommend follow-up chest radiograph in 4 weeks to document resolution.  Borderline cardiomegaly with 3 vessel atherosclerotic coronary artery disease. Tiny amount of pericardial fluid.  Fluid in the distal esophagus likely due to dysmotility or reflux.   Electronically Signed   By: Marin Olp M.D.   On: 05/24/2015 13:40   Dg Chest Port 1 View  05/25/2015   CLINICAL DATA:  Wheezing, shortness of breath  EXAM: PORTABLE CHEST - 1 VIEW  COMPARISON:  Chest CT 05/24/2015, chest radiograph 05/24/2015  FINDINGS: The heart size at upper limits of normal. Lungs are hypoaerated with crowding of the bronchovascular markings. No focal pulmonary opacity. The visualized skeletal structures are unremarkable.  IMPRESSION: Low volume exam without focal acute finding. If symptoms persist, consider PA and lateral chest radiographs obtained at full inspiration when the patient is clinically able.   Electronically Signed   By: Conchita Paris M.D.   On: 05/25/2015 16:06    Medications:     Inpatient Medications: . antiseptic oral rinse  7 mL Mouth Rinse BID  . apixaban  2.5 mg Oral BID  . carvedilol  12.5 mg Oral BID WC  . clindamycin (CLEOCIN) IV  600 mg Intravenous 3 times per  day  . famotidine (PEPCID) IV  20 mg Intravenous Q12H  . fluconazole (DIFLUCAN) IV  200 mg Intravenous Q24H  . insulin aspart  0-20 Units Subcutaneous TID WC  . insulin aspart  0-5 Units Subcutaneous QHS  . insulin aspart  4 Units Subcutaneous TID WC  . insulin glargine  30 Units Subcutaneous Daily  . ipratropium  0.5 mg Nebulization TID  . levalbuterol  0.63 mg Nebulization TID  . methimazole  5 mg Oral Once per day on Mon Wed Fri  . predniSONE  30 mg Oral Q breakfast  . torsemide  40 mg Oral Daily    Infusions:    PRN Medications: acetaminophen, albuterol, dextrose, diphenhydrAMINE, nitroGLYCERIN   Assessment:  [name] is a pleasant 81 yo woman with CAD (s/p BMS to OM1 2008, nonobst dz 2015), HTN, chronic afib/flutter, and chronic prior systolic heart failure (which was likely rate related) and diastolic heart failure admitted with increased dyspnea.  1. Acute respiratory failure, felt related to CAP vs possible aspiration PNA - per IM.  2. Angioedema, possibly secondary to allergic reaction from Levaquin - improved off Levaquin, s/p steroids. 3. Acute on chronic diastolic CHF with mild pulmonary hypertension - Diuresed with IV lasix and transitioned to torsemide. Continue torsemide 40 mg daily. Weight down 1 pound. 4. CKD stage III - followup labs in  AM.  5. Chronic afib/flutter - continue apixaban 2.5mg  BID. Rate controlled. 6. Abnormal LFTs with elevated CK on admission - ? R/t acute illness vs statin. F/u in AM. Crestor discontinued. 7. Stable CAD - On Eliquis so no aspirin. Continue BB. No statin with elevated CK and LFTs.  8. Dyslipidemia - statin on hold as above due to elevated CK and LFTs.  9. Diabetes mellitus - per IM.  Length of Stay: 5  [name],[name]  05/29/2015, 8:47 AM  Advanced Heart Failure Team Pager [phone] (M-F; 7a - 4p)  Please contact [facility] for night-coverage after hours (4p -7a ) and weekends on [email or web address]  Patient seen with NP, agree  with the above note.  Volume status looks good, continue po torsemide.  Rate-controlled atrial fibrillation on Eliquis.  Stable from cardiac standpoint.  Has not been out of bed, may need SNF.  Has PT consult.  Loralie Champagne 05/29/2015 8:56 AM

## 2015-05-29 NOTE — Progress Notes (Addendum)
Inpatient Diabetes Program Recommendations  AACE/ADA: New Consensus Statement on Inpatient Glycemic Control (2013)  Target Ranges:  Prepandial:   less than 140 mg/dL      Peak postprandial:   less than 180 mg/dL (1-2 hours)      Critically ill patients:  140 - 180 mg/dL   Results for OTHA, RICKLES (MRN [id number]) as of 05/29/2015 08:12  Ref. Range 05/28/2015 10:22 05/28/2015 11:44 05/28/2015 16:43 05/28/2015 20:28 05/29/2015 07:30  Glucose-Capillary Latest Ref Range: 65-99 mg/dL 514 (H) 418 (H) 245 (H) 176 (H) 305 (H)   Current orders for Inpatient glycemic control: Lantus 30 units Daily, Novolog 0-20 units TID, Novolog 0-5 units QHS, Novolog 4 units TID meal coverage  Inpatient Diabetes Program Recommendations Insulin - Basal: Glucose 305 mg/dl this am. Please increase basal insulin to Lantus 34 units Daily. PO prednisone being tapered.  Thanks,  Tama Headings RN, MSN, [facility] Inpatient Diabetes Coordinator Team Pager [phone]

## 2015-05-30 ENCOUNTER — Inpatient Hospital Stay (HOSPITAL_COMMUNITY): Payer: Medicare Other

## 2015-05-30 ENCOUNTER — Encounter (HOSPITAL_COMMUNITY): Payer: Self-pay

## 2015-05-30 LAB — BASIC METABOLIC PANEL
Anion gap: 17 — ABNORMAL HIGH (ref 5–15)
BUN: 61 mg/dL — AB (ref 6–20)
CHLORIDE: 84 mmol/L — AB (ref 101–111)
CO2: 38 mmol/L — ABNORMAL HIGH (ref 22–32)
CREATININE: 1.92 mg/dL — AB (ref 0.44–1.00)
Calcium: 9.2 mg/dL (ref 8.9–10.3)
GFR calc Af Amer: 27 mL/min — ABNORMAL LOW (ref >60)
GFR calc non Af Amer: 23 mL/min — ABNORMAL LOW (ref >60)
Glucose, Bld: 217 mg/dL — ABNORMAL HIGH (ref 65–99)
POTASSIUM: 3.2 mmol/L — AB (ref 3.5–5.1)
SODIUM: 139 mmol/L (ref 135–145)

## 2015-05-30 LAB — CULTURE, BLOOD (ROUTINE X 2)
CULTURE: NO GROWTH
CULTURE: NO GROWTH

## 2015-05-30 LAB — GLUCOSE, CAPILLARY
GLUCOSE-CAPILLARY: 190 mg/dL — AB (ref 65–99)
Glucose-Capillary: 172 mg/dL — ABNORMAL HIGH (ref 65–99)
Glucose-Capillary: 214 mg/dL — ABNORMAL HIGH (ref 65–99)
Glucose-Capillary: 153 mg/dL — ABNORMAL HIGH (ref 65–99)

## 2015-05-30 MED ORDER — POTASSIUM CHLORIDE CRYS ER 20 MEQ PO TBCR
40.0000 meq | EXTENDED_RELEASE_TABLET | Freq: Once | ORAL | Status: AC
  Administered 2015-05-30: 40 meq via ORAL
  Filled 2015-05-30: qty 2

## 2015-05-30 NOTE — Care Management Note (Signed)
Case Management Note  Patient Details  Name: [name] MRN: [id number] Date of Birth: [birth year]  Subjective/Objective:  Pt admitted for Acute Respiratory Failure.          Action/Plan: Plan for SNF once stable.    Expected Discharge Date:                  Expected Discharge Plan:  [place]  In-House Referral:     Discharge planning Services  CM Consult  Post Acute Care Choice:    Choice offered to:     DME Arranged:    DME Agency:     HH Arranged:    [place]:     Status of Service:     Medicare Important Message Given:  Yes Date Medicare IM Given:  05/27/15 Medicare IM give by:  Jacqlyn Krauss, RN,BSN  Date Additional Medicare IM Given:  05/30/15 Additional Medicare Important Message give by:  Jacqlyn Krauss, RN,BSN   If discussed at Long Length of Stay Meetings, dates discussed:    Additional Comments:  [name], RN 05/30/2015, 3:35 PM

## 2015-05-30 NOTE — Progress Notes (Signed)
Rehab admissions - Evaluated for possible admission.  I spoke with MD and unit case manager.  Patient likely ready for discharge before I could get approval from [facility].  I spoke with patient about the potential need for SNF placement.  She tells me that her son and daughter need to be contacted about placement.  They will help patient make decision about rehab.  Patient currently has a sitter at the bedside.  Call me for questions.  #[phone]

## 2015-05-30 NOTE — Progress Notes (Signed)
Placed patient on home CPAP with 3L O2 bled in. Patient is tolerating it well. RT will continue to monitor.

## 2015-05-30 NOTE — Clinical Social Work Note (Addendum)
12:33pm- Patient's son, Legrand Como contacted CSW to inform CSW that MD called Legrand Como to provide update regarding disposition.  Legrand Como states MD reports patient not being able to discharge today.  CSW informed [facility] who will have bed available at time of discharge. Son was updated.    11:56am- CSW completed assessment with patient's son, Legrand Como.  Legrand Como states his bed choice is [facility].  CSW has sent clinicals to [facility] for review.  Patient has insurance which requires an authorization with a typical turn-around time of approximately 4 hours.  Son is aware and agreeable.  Son would also like to transport patient to SNF at time of discharge.  RNCM and MD updated.  FL2 on chart for MD to sign.  Nonnie Done, LCSW [phone]  Psychiatric & Orthopedics (5N 1-8) Clinical Social Worker

## 2015-05-30 NOTE — Progress Notes (Signed)
[facility] PROGRESS NOTE  DENEICE WACK XTK:[id number] DOB: Oct 30, 1931 DOA: 05/24/2015 PCP: [name], MD  Interim summary 81 year old with PMH significant for HTN, OSA, Diabetes A fib, HF who presents with dyspnea, respiratory failure. Found to have PNA and Heart failure exacerbation. Hospital course complicated by angioedema.   Assessment/Plan:   Acute Respiratory Failure; likely related to PNA.  HIV negative, legionella and strep antigen were never sent.  Influenza panel PCR negative,.  Patient was started on levofloxacin but developed allergic reaction/angioedema-discontinued and switched to clindamycin  Patient was DNR, but under current circumstance and likely reversible cause of respiratory failure secondary to angioedema, patient has changed Code status to full code for now.  Improving. Repeat ABG 6/1 reviewed. PH 7.45, PCO2 47.6, PO2 67 and oxygen saturation 92% on 4 L oxygen. Incentive spirometry.  PA and lateral chest x-ray in a.m: No pulmonary edema. Left basal atelectasis versus infiltrate. Will need follow-up chest x-ray in 4-6 weeks.. - resolved. Sat's low 90's on RA.  Community-acquired pneumonia - Antibiotic management as above - Blood cultures 2 negative to date - Day 6 antibiotics. DC clindamycin after 6/3 doses.  Angioedema-likely from levofloxacin Treated with IV solumedrol, IV benadryl, Pepcid.  resolved changed IV Solu-Medrol to oral prednisonand has completed 3 days of total steroids. DC steroids and Pepcid.   Acute on Chronic diastolic heart failure - Patient was treated with IV Lasix. Intake and output charting has not been accurate but weight is down. - Cardiology consulting and have switched to oral torsemide - Stable from cardiac standpoint.   Uncontrolled type 2 diabetes mellitus with insulin therapy Patient was changed from 70/30 insulin to Lantus and SSI .  Hemoglobin A1c: 7.9 suggesting poor outpatient control Current worsening  likely from steroids-tapering off rapidly - Increased Lantus to 35 units daily, changed sliding scale to resistant and added mealtime NovoLog - CBGs still in the 300s on 6/2 and hence increased the Lantus from 30>35 units daily, continue mealtime and SSI. DC steroids. Better.  Acute on stage III chronic kidney disease Likely from IV Lasix Creatinine was 1.12 on 04/10/15. IV Lasix discontinued and changed to torsemide. Monitor BMP closely to see if it improves.  creatinine stable in the 1.8 range for the last 2 days.    HTN  -Continue home medications - Controlled   Atrial flutter - Controlled ventricular rate. Continue carvedilol -Continue eliquis for anticoagulation  UTI;  - urine growing E coli. Completed at least 3 days of levofloxacin adjusted to renal function. Treated.  Hyperthyroidism - Continue Tapazole - TSH 4.04  OSA on CPAP - Continue nocturnal CPAP using home supplies  Oral Trush &? Esophageal candidiasis;  Started fluconazole 6/1 - Much better. Requested pharmacy to adjust dose as needed.   Abnormal LFTs - Statins on hold.? Related to acute illness. LFTs unchanged. CK has normalized.  Dyslipidemia - Statins on hold secondary to abnormal LFTs  CAD - Stable. No aspirin while on anticoagulation. Continue beta blockers  No Anemia and thrombocytopenia - probably a lab error. Hemoglobin and platelets are normal.   Altered mental status/? Acute encephalopathy - Noted by nursing 6/1. Patient was coherent 6/1 morning but gradually became slightly confused as day progressed, according to nursing. No focal deficits. ABG does not show significant CO2 retention. Unclear etiology. Have DC'ed steroids already.  - Definitely improving per nursing with periods of confusion. No agitation.  - Obtained CT head (on AC and wanted to R/o bleed): No acute IC abnormalities/bleed. -  Could be due to residual effects of Levofloxacin in context of renal insufficiency.  DVT  prophylaxis: On full anticoagulation with apixaban Code Status: Full Family Communication: Discussed with son Shakiah Wester on 6/3 Disposition Plan: DC to SNF when bed available- 6/4  Consultants:  Cardiology  Procedures:  none  Antibiotics:  Levaquin stopped 5-31 concern for allergic reaction.   clindamycin  HPI/Subjective: Fatigued, otherwise denies complaints.  Objective: Filed Vitals:   05/30/15 0549 05/30/15 0817 05/30/15 1210 05/30/15 1404  BP: 119/84     Pulse: 73     Temp: 97.6 F (36.4 C)  97.7 F (36.5 C)   TempSrc: Oral  Oral   Resp: 19     Weight: 77.021 kg (169 lb 12.8 oz)     SpO2: 93% 93%  94%      Intake/Output Summary (Last 24 hours) at 05/30/15 1436 Last data filed at 05/30/15 1300  Gross per 24 hour  Intake   1180 ml  Output   2150 ml  Net   -970 ml   Filed Weights   05/29/15 0619 05/30/15 0500 05/30/15 0549  Weight: 78.336 kg (172 lb 11.2 oz) 77.021 kg (169 lb 12.8 oz) 77.021 kg (169 lb 12.8 oz)    Exam:   General:  Pleasant elderly female lying comfortably supine in bed this morning   Cardiovascular: S1 and S2 heard, irregularly irregular. Telemetry shows atrial flutter with controlled ventricular rate. No JVD, murmurs or pedal edema.  Respiratory: Slightly diminished breath sounds in the bases but otherwise clear to auscultation.  Abdomen: BS present, soft, nt  Musculoskeletal: no edema.    ENT; extensive oral thrush on tongue and buccal mucosa-much improved . Otherwise no acute findings appreciated.  CNS: Alert and oriented 3. No focal deficits.  Data Reviewed: Basic Metabolic Panel:  Recent Labs Lab 05/26/15 0323 05/27/15 0245 05/28/15 0805 05/28/15 1402 05/29/15 0820 05/30/15 0339  NA 137 132*  --  133* 135 139  K 4.6 4.3  --  3.5 3.3* 3.2*  CL 96* 96*  --  89* 87* 84*  CO2 29 26  --  31 32 38*  GLUCOSE 288* 216* 493* 367* 316* 217*  BUN 34* 40*  --  55* 63* 61*  CREATININE 1.88* 1.69*  --  1.86* 1.89*  1.92*  CALCIUM 8.8* 8.5*  --  9.1 8.8* 9.2   Liver Function Tests:  Recent Labs Lab 05/26/15 0323 05/29/15 0820  AST 159* 156*  ALT 72* 129*  ALKPHOS 58 84  BILITOT 0.9 0.8  PROT 7.1 7.1  ALBUMIN 2.3* 2.1*   No results for input(s): LIPASE, AMYLASE in the last 168 hours. No results for input(s): AMMONIA in the last 168 hours. CBC:  Recent Labs Lab 05/23/15 1457 05/24/15 1120 05/25/15 0344 05/26/15 0323 05/27/15 0245 05/29/15 0820  WBC 14.4* 13.0* 14.4* 13.0* 11.0* 23.3*  NEUTROABS 13.4* 12.0* 13.6*  --   --   --   HGB 13.7 13.6 12.5 13.0 11.9* 14.4  HCT 42.0 41.8 38.8 40.6 37.1 42.9  MCV 88.7 89.1 88.8 89.4 87.9 85.6  PLT 136.0* 136* 129* 130* 145* 240   Cardiac Enzymes:  Recent Labs Lab 05/23/15 1457 05/29/15 0820  CKTOTAL 779* 90   BNP (last 3 results)  Recent Labs  04/10/15 1305 05/24/15 1120 05/29/15 0820  BNP 165.4* 400.8* 227.9*    ProBNP (last 3 results) No results for input(s): PROBNP in the last 8760 hours.  CBG:  Recent Labs Lab 05/29/15 1630 05/29/15 2012  05/29/15 2116 05/30/15 0754 05/30/15 1204  GLUCAP 180* 192* 213* 190* 172*    Recent Results (from the past 240 hour(s))  Urine culture     Status: None   Collection Time: 05/24/15 12:38 PM  Result Value Ref Range Status   Specimen Description URINE, RANDOM  Final   Special Requests NONE  Final   Colony Count   Final    >=100,000 COLONIES/ML Performed at [facility]    Culture   Final    ESCHERICHIA COLI Performed at [facility]    Report Status 05/26/2015 FINAL  Final   Organism ID, Bacteria ESCHERICHIA COLI  Final      Susceptibility   Escherichia coli - MIC*    AMPICILLIN <=2 SENSITIVE Sensitive     CEFAZOLIN <=4 SENSITIVE Sensitive     CEFTRIAXONE <=1 SENSITIVE Sensitive     CIPROFLOXACIN <=0.25 SENSITIVE Sensitive     GENTAMICIN <=1 SENSITIVE Sensitive     LEVOFLOXACIN <=0.12 SENSITIVE Sensitive     NITROFURANTOIN <=16 SENSITIVE Sensitive      TOBRAMYCIN <=1 SENSITIVE Sensitive     TRIMETH/SULFA <=20 SENSITIVE Sensitive     PIP/TAZO <=4 SENSITIVE Sensitive     * ESCHERICHIA COLI  Culture, blood (routine x 2) Call MD if unable to obtain prior to antibiotics being given     Status: None   Collection Time: 05/24/15  4:29 PM  Result Value Ref Range Status   Specimen Description BLOOD LEFT ARM  Final   Special Requests BOTTLES DRAWN AEROBIC ONLY 10CC  Final   Culture   Final    NO GROWTH 5 DAYS Performed at [facility]    Report Status 05/30/2015 FINAL  Final  Culture, blood (routine x 2) Call MD if unable to obtain prior to antibiotics being given     Status: None   Collection Time: 05/24/15  4:33 PM  Result Value Ref Range Status   Specimen Description BLOOD LEFT HAND  Final   Special Requests BOTTLES DRAWN AEROBIC ONLY 10CC  Final   Culture   Final    NO GROWTH 5 DAYS Performed at [facility]    Report Status 05/30/2015 FINAL  Final     Studies: Dg Chest 2 View  05/29/2015   CLINICAL DATA:  Shortness of breath, congestive heart failure  EXAM: CHEST  2 VIEW  COMPARISON:  05/25/2015  FINDINGS: Cardiomediastinal silhouette is stable. No pulmonary edema. There is hazy left basilar atelectasis or infiltrate best seen on lateral view. Mild degenerative changes thoracic spine.  IMPRESSION: No pulmonary edema. Hazy left basilar atelectasis or infiltrate best seen on lateral view.   Electronically Signed   By: Lahoma Crocker M.D.   On: 05/29/2015 09:06   Ct Head Wo Contrast  05/30/2015   CLINICAL DATA:  Acute onset confusion  EXAM: CT HEAD WITHOUT CONTRAST  TECHNIQUE: Contiguous axial images were obtained from the base of the skull through the vertex without intravenous contrast.  COMPARISON:  July 11, 2013  FINDINGS: From there is mild diffuse atrophy. There is no intracranial mass, hemorrhage, extra-axial fluid collection, or midline shift. There is slight small vessel disease in the centra semiovale bilaterally.  Elsewhere gray-white compartments appear normal. No acute infarct apparent. Bony calvarium appears intact. The mastoid air cells are clear.  IMPRESSION: Mild atrophy with slight periventricular small vessel disease. No intracranial mass, hemorrhage, or evidence of acute infarct.   Electronically Signed   By: Lowella Grip III M.D.  On: 05/30/2015 13:51    Scheduled Meds: . antiseptic oral rinse  7 mL Mouth Rinse BID  . apixaban  2.5 mg Oral BID  . carvedilol  12.5 mg Oral BID WC  . clindamycin (CLEOCIN) IV  600 mg Intravenous 3 times per day  . fluconazole  100 mg Oral Daily  . insulin aspart  0-20 Units Subcutaneous TID WC  . insulin aspart  0-5 Units Subcutaneous QHS  . insulin aspart  4 Units Subcutaneous TID WC  . insulin glargine  35 Units Subcutaneous Daily  . ipratropium  0.5 mg Nebulization TID  . levalbuterol  0.63 mg Nebulization TID  . methimazole  5 mg Oral Once per day on Mon Wed Fri  . torsemide  40 mg Oral Daily   Continuous Infusions:    Principal Problem:   Acute respiratory failure with hypoxia Active Problems:   Uncontrolled type 2 diabetes mellitus with insulin therapy   HYPERLIPIDEMIA   Iron deficiency anemia   HTN (hypertension)   Atrial flutter   Hyperthyroidism   Pulmonary HTN   Chronic diastolic heart failure   OSA on CPAP   CAP (community acquired pneumonia)   Dehydration   Abnormal urinalysis   Sepsis   Acute on chronic diastolic CHF (congestive heart failure)   Acute encephalopathy   Oral thrush   Acute renal failure superimposed on stage 3 chronic kidney disease    Time spent: 25 minutes.    Vernell Leep, MD, FACP, FHM. [facility] Pager [phone]  If 7PM-7AM, please contact night-coverage [email or web address] Password [email or web address] 05/30/2015, 2:36 PM   LOS: 6 days

## 2015-05-31 LAB — BASIC METABOLIC PANEL
ANION GAP: 13 (ref 5–15)
BUN: 53 mg/dL — AB (ref 6–20)
CALCIUM: 9.1 mg/dL (ref 8.9–10.3)
CO2: 45 mmol/L — AB (ref 22–32)
CREATININE: 1.65 mg/dL — AB (ref 0.44–1.00)
Chloride: 84 mmol/L — ABNORMAL LOW (ref 101–111)
GFR calc Af Amer: 33 mL/min — ABNORMAL LOW (ref >60)
GFR calc non Af Amer: 28 mL/min — ABNORMAL LOW (ref >60)
Glucose, Bld: 65 mg/dL (ref 65–99)
POTASSIUM: 3.5 mmol/L (ref 3.5–5.1)
SODIUM: 142 mmol/L (ref 135–145)

## 2015-05-31 LAB — CBC
HCT: 47.8 % — ABNORMAL HIGH (ref 36.0–46.0)
Hemoglobin: 15.4 g/dL — ABNORMAL HIGH (ref 12.0–15.0)
MCH: 28.4 pg (ref 26.0–34.0)
MCHC: 32.2 g/dL (ref 30.0–36.0)
MCV: 88 fL (ref 78.0–100.0)
PLATELETS: 337 10*3/uL (ref 150–400)
RBC: 5.43 MIL/uL — AB (ref 3.87–5.11)
RDW: 16.1 % — AB (ref 11.5–15.5)
WBC: 15 10*3/uL — ABNORMAL HIGH (ref 4.0–10.5)

## 2015-05-31 LAB — GLUCOSE, CAPILLARY
GLUCOSE-CAPILLARY: 128 mg/dL — AB (ref 65–99)
GLUCOSE-CAPILLARY: 209 mg/dL — AB (ref 65–99)

## 2015-05-31 MED ORDER — TORSEMIDE 20 MG PO TABS: 40.0000 mg | ORAL_TABLET | Freq: Every day | ORAL | Status: DC

## 2015-05-31 MED ORDER — ALBUTEROL SULFATE (2.5 MG/3ML) 0.083% IN NEBU: 2.5000 mg | INHALATION_SOLUTION | RESPIRATORY_TRACT | Status: DC | PRN

## 2015-05-31 MED ORDER — INSULIN GLARGINE 100 UNIT/ML ~~LOC~~ SOLN: 25.0000 [IU] | Freq: Every day | SUBCUTANEOUS | Status: DC

## 2015-05-31 MED ORDER — APIXABAN 5 MG PO TABS: 2.5000 mg | ORAL_TABLET | Freq: Two times a day (BID) | ORAL | Status: DC

## 2015-05-31 MED ORDER — INSULIN ASPART 100 UNIT/ML ~~LOC~~ SOLN: 0.0000 [IU] | Freq: Three times a day (TID) | SUBCUTANEOUS | Status: DC

## 2015-05-31 MED ORDER — FLUCONAZOLE 100 MG PO TABS: 100.0000 mg | ORAL_TABLET | Freq: Every day | ORAL | Status: DC

## 2015-05-31 MED ORDER — POTASSIUM CHLORIDE CRYS ER 20 MEQ PO TBCR: 20.0000 meq | EXTENDED_RELEASE_TABLET | Freq: Every day | ORAL | Status: DC

## 2015-05-31 NOTE — Clinical Social Work Placement (Signed)
   CLINICAL SOCIAL WORK PLACEMENT  NOTE  Date:  05/31/2015  Patient Details  Name: [name] MRN: [id number] Date of Birth: November 16, 1931  Clinical Social Work is seeking post-discharge placement for this patient at the [place] level of care (*CSW will initial, date and re-position this form in  chart as items are completed):  Yes   Patient/family provided with [facility] Work Department's list of facilities offering this level of care within the geographic area requested by the patient (or if unable, by the patient's family).  Yes   Patient/family informed of their freedom to choose among providers that offer the needed level of care, that participate in Medicare, Medicaid or managed care program needed by the patient, have an available bed and are willing to accept the patient.  Yes   Patient/family informed of [facility]'s ownership interest in [facility] and [facility], as well as of the fact that they are under no obligation to receive care at these facilities.  PASRR submitted to EDS on       PASRR number received on       Existing PASRR number confirmed on 05/31/15     FL2 transmitted to all facilities in geographic area requested by pt/family on 05/30/15     FL2 transmitted to all facilities within larger geographic area on       Patient informed that his/her managed care company has contracts with or will negotiate with certain facilities, including the following:        Yes   Patient/family informed of bed offers received.  Patient chooses bed at [facility]     Physician recommends and patient chooses bed at      Patient to be transferred to [facility] on 05/31/15.  Patient to be transferred to facility by Ambulance     Patient family notified on 05/31/15 of transfer.  Name of family member notified:  Legrand Como     PHYSICIAN       Additional Comment:  Per MD patient ready for DC to [facility]. RN, patient,  patient's family, and facility notified of DC. RN given number for report. DC packet on chart. Ambulance transport requested for patient. CSW signing off.   _______________________________________________ Liz Beach, MSW, [place], [place] [phone]

## 2015-05-31 NOTE — Discharge Summary (Signed)
Physician Discharge Summary  [name] MHD:[id number] DOB: 03/15/1931 DOA: 05/24/2015  PCP: [name], MD  Admit date: 05/24/2015 Discharge date: 05/31/2015  Time spent: Greater than 30 minutes  Recommendations for Outpatient Follow-up:  1. M.D. at SNF in 2-3 days with repeat labs (CMP & CBC) 2. Recommend repeating chest x-ray in 4-6 weeks 3. Consider outpatient neurology consultation if patient's confusion does not progressively resolve or if it gets worse.  4. Dr. Pierre Bali, Cardiology: SNF to arrange follow-up in 1-2 weeks.  5. Monitor CBGs 3 times a day before meals and at bedtime 6. Monitor oxygen saturations every shift. Oxygen via nasal cannula at 1-2 L/m and titrate down to DC as long as she maintains oxygen saturations >92%. 7. Incentive spirometry  8. Nightly CPAP at prior home settings  Discharge Diagnoses:  Principal Problem:   Acute respiratory failure with hypoxia Active Problems:   Uncontrolled type 2 diabetes mellitus with insulin therapy   HYPERLIPIDEMIA   Iron deficiency anemia   HTN (hypertension)   Atrial flutter   Hyperthyroidism   Pulmonary HTN   Chronic diastolic heart failure   OSA on CPAP   CAP (community acquired pneumonia)   Dehydration   Abnormal urinalysis   Sepsis   Acute on chronic diastolic CHF (congestive heart failure)   Acute encephalopathy   Oral thrush   Acute renal failure superimposed on stage 3 chronic kidney disease   Discharge Condition: Improved & Stable  Diet recommendation: Heart healthy and diabetic diet.   Filed Weights   05/30/15 0500 05/30/15 0549 05/31/15 0507  Weight: 77.021 kg (169 lb 12.8 oz) 77.021 kg (169 lb 12.8 oz) 78.608 kg (173 lb 4.8 oz)    History of present illness:  81 year old with PMH significant for HTN, OSA, Diabetes A fib, HF who presents with dyspnea, respiratory failure. Found to have PNA and Heart failure exacerbation. Hospital course complicated by angioedema.   Hospital Course:    Acute Respiratory Failure; likely related to left lower lobe PNA and decompensated CHF.  CT chest confirmed left lower lobe pneumonia  HIV negative, legionella and strep antigen were never sent.  Influenza panel PCR negative,.  Patient was started on levofloxacin but developed allergic reaction/angioedema-discontinued and switched to clindamycin  Patient was DNR, but under current circumstance and likely reversible cause of respiratory failure secondary to angioedema, patient has changed Code status to full code for now. Consider readdressing CODE STATUS at SNF. Repeat ABG 6/1 reviewed. PH 7.45, PCO2 47.6, PO2 67 and oxygen saturation 92% on 4 L oxygen. Incentive spirometry.  PA and lateral chest x-ray in a.m: No pulmonary edema. Left basal atelectasis versus infiltrate. Will need follow-up chest x-ray in 4-6 weeks.. -  significantly improved. Occasionally patient noted to have oxygen saturations in the mid 80s. Continue oxygen supplementation and titrate down as tolerated.   Community-acquired pneumonia - Antibiotic management as above - Blood cultures 2 negative to date - completed 7 days course of antibiotics.   Angioedema-likely from levofloxacin Treated with IV solumedrol, IV benadryl, Pepcid.  Resolved Leukocytosis likely secondary to steroids-improving.   Acute on Chronic diastolic heart failure - Patient was treated with IV Lasix. Intake and output charting has not been accurate but weight is down. - Cardiology consulting and have switched to oral torsemide - Stable from cardiac standpoint.  - Discussed with cardiology and patient to be discharged on current dose of diuretics with close follow-up of BMP.  - Elevated bicarbonate likely secondary to diuretics and  if worsens, may consider cutting back on dose of torsemide +/- trial of a dose of acetazolamide.   Uncontrolled type 2 diabetes mellitus with insulin therapy Patient was changed from 70/30 insulin to Lantus and  SSI in the hospital. .  Hemoglobin A1c: 7.9 suggesting poor outpatient control Current worsening likely from steroids-tapered off rapidly - Lantus had been increased to 35 units daily, resistant NovoLog SSI and mealtime NovoLog was added. With stoppage of steroids, her CBGs are better controlled. She in fact had a blood sugar of 65 on this morning's BMP. Thereby will cut back on Lantus to 25 units daily and change sliding scale to moderate sensitivity and DC mealtime NovoLog. Monitor CBGs closely and SNF to make necessary adjustments.   Acute on stage III chronic kidney disease Likely from IV Lasix and acute illness.  Creatinine was 1.12 on 04/10/15. IV Lasix discontinued and changed to torsemide.   creatinine was ranging in the 1.8-1.9 range over the last few days but has decreased to 1.65 today. Close outpatient follow-up and may consider nephrology consultation.    HTN  - Continue home medications - Controlled   Atrial flutter - Controlled ventricular rate. Continue carvedilol -Continue eliquis for anticoagulation-adjusted for renal function.   UTI;  - urine growing E coli. Completed at least 4 days of levofloxacin adjusted to renal function. Treated.  Hyperthyroidism - Continue Tapazole - TSH 4.04  OSA on CPAP - Continue nocturnal CPAP using home supplies  Oral Trush &? Esophageal candidiasis;  Started fluconazole 6/1 - Much better. - Complete total 7 days treatment.   Abnormal LFTs - Statins on hold.? Related to acute illness. LFTs unchanged. CK has normalized. - Follow LFTs as outpatient and resume statins when appropriate.   Dyslipidemia - Statins on hold secondary to abnormal LFTs  CAD - Stable. No aspirin while on anticoagulation. Continue beta blockers  No Anemia and thrombocytopenia - probably a lab error. Hemoglobin and platelets are normal.   Altered mental status/? Acute encephalopathy - Noted by nursing 6/1. Patient was coherent 6/1 morning but  gradually became slightly confused as day progressed, according to nursing. No focal deficits. ABG does not show significant CO2 retention. Unclear etiology. Have DC'ed steroids already.  - Definitely improving per nursing with periods of confusion. No agitation.  - Obtained CT head (on AC and wanted to R/o bleed): No acute IC abnormalities/bleed. - Could be due to residual effects of Levofloxacin in context of renal insufficiency. - Monitor closely at SNF and may consider neurology consultation and/or further evaluation if does not progressively resolve or if worsens.  Consultants:  Cardiology  Procedures:  none   Discharge Exam:  Complaints: Patient denies complaints. Denies dyspnea, cough, chest pain or headache. Periodically confused.   Filed Vitals:   05/31/15 0430 05/31/15 0507 05/31/15 0944 05/31/15 1000  BP:  127/60    Pulse:  71    Temp:  97.7 F (36.5 C)    TempSrc:  Oral    Resp: 19 18    Height:    5\' 9"  (1.753 m)  Weight:  78.608 kg (173 lb 4.8 oz)    SpO2:  97% 95%      General: Pleasant elderly female lying comfortably supine in bed this morning   Cardiovascular: S1 and S2 heard, irregularly irregular. Telemetry shows atrial flutter with controlled ventricular rate. No JVD, murmurs or pedal edema.  Respiratory: Slightly diminished breath sounds in the bases but otherwise clear to auscultation.  Abdomen: BS present, soft, nt  Musculoskeletal: no edema.   ENT; extensive oral thrush on tongue and buccal mucosa-much improved . Otherwise no acute findings appreciated.  CNS: Alert and oriented 3. No focal deficits.  Discharge Instructions      Discharge Instructions    (HEART FAILURE PATIENTS) Call MD:  Anytime you have any of the following symptoms: 1) 3 pound weight gain in 24 hours or 5 pounds in 1 week 2) shortness of breath, with or without a dry hacking cough 3) swelling in the hands, feet or stomach 4) if you have to sleep on extra pillows  at night in order to breathe.    Complete by:  As directed      Call MD for:  difficulty breathing, headache or visual disturbances    Complete by:  As directed      Call MD for:  extreme fatigue    Complete by:  As directed      Call MD for:  persistant dizziness or light-headedness    Complete by:  As directed      Call MD for:  persistant nausea and vomiting    Complete by:  As directed      Call MD for:  severe uncontrolled pain    Complete by:  As directed      Call MD for:  temperature >100.4    Complete by:  As directed      Call MD for:    Complete by:  As directed   Altered mental status or worsening confusion.     Diet - low sodium heart healthy    Complete by:  As directed      Diet Carb Modified    Complete by:  As directed      Increase activity slowly    Complete by:  As directed             Medication List    STOP taking these medications        furosemide 40 MG tablet  Commonly known as:  LASIX     glucose blood test strip  Commonly known as:  FREESTYLE LITE     Insulin Lispro Prot & Lispro (75-25) 100 UNIT/ML Kwikpen  Commonly known as:  HUMALOG MIX 75/25 KWIKPEN     PEN NEEDLES 31GX5/16" 31G X 8 MM Misc     rosuvastatin 20 MG tablet  Commonly known as:  CRESTOR      TAKE these medications        albuterol (2.5 MG/3ML) 0.083% nebulizer solution  Commonly known as:  PROVENTIL  Take 3 mLs (2.5 mg total) by nebulization every 4 (four) hours as needed for wheezing or shortness of breath.     apixaban 5 MG Tabs tablet  Commonly known as:  ELIQUIS  Take 0.5 tablets (2.5 mg total) by mouth 2 (two) times daily.     carvedilol 12.5 MG tablet  Commonly known as:  COREG  Take 1 tablet (12.5 mg total) by mouth 2 (two) times daily with a meal.     fluconazole 100 MG tablet  Commonly known as:  DIFLUCAN  Take 1 tablet (100 mg total) by mouth daily. Discontinue after 06/03/2015 dose.     insulin aspart 100 UNIT/ML injection  Commonly known as:   novoLOG  - Inject 0-15 Units into the skin 3 (three) times daily with meals. Correction coverage: Moderate (average weight, post-op)  - CBG < 70: implement hypoglycemia protocol  - CBG 70 - 120: 0 units  -  CBG 121 - 150: 2 units  - CBG 151 - 200: 3 units  - CBG 201 - 250: 5 units  - CBG 251 - 300: 8 units  - CBG 301 - 350: 11 units  - CBG 351 - 400: 15 units  - CBG > 400: call MD.     insulin glargine 100 UNIT/ML injection  Commonly known as:  LANTUS  Inject 0.25 mLs (25 Units total) into the skin daily.     methimazole 5 MG tablet  Commonly known as:  TAPAZOLE  Take 5 mg by mouth 3 (three) times a week.     nitroGLYCERIN 0.4 MG SL tablet  Commonly known as:  NITROSTAT  Place 0.4 mg under the tongue every 5 (five) minutes as needed for chest pain.     potassium chloride SA 20 MEQ tablet  Commonly known as:  K-DUR,KLOR-CON  Take 1 tablet (20 mEq total) by mouth daily.     tiotropium 18 MCG inhalation capsule  Commonly known as:  SPIRIVA  Place 1 capsule (18 mcg total) into inhaler and inhale daily.     torsemide 20 MG tablet  Commonly known as:  DEMADEX  Take 2 tablets (40 mg total) by mouth daily.       Follow-up Information    Follow up with Loralie Champagne, MD On 06/19/2015.   Specialty:  Cardiology   Why:  at 2:40. Garage 0800   Contact information:   [address] [place] [address] [phone]        The results of significant diagnostics from this hospitalization (including imaging, microbiology, ancillary and laboratory) are listed below for reference.    Significant Diagnostic Studies: Dg Chest 2 View  05/29/2015   CLINICAL DATA:  Shortness of breath, congestive heart failure  EXAM: CHEST  2 VIEW  COMPARISON:  05/25/2015  FINDINGS: Cardiomediastinal silhouette is stable. No pulmonary edema. There is hazy left basilar atelectasis or infiltrate best seen on lateral view. Mild degenerative changes thoracic spine.  IMPRESSION: No  pulmonary edema. Hazy left basilar atelectasis or infiltrate best seen on lateral view.   Electronically Signed   By: Lahoma Crocker M.D.   On: 05/29/2015 09:06   Dg Chest 2 View  05/24/2015   CLINICAL DATA:  Dizziness, weakness, inability to walk, high blood sugarHx of congestion heart failure, DM, HTN, CAD, myocardial infarction  EXAM: CHEST  2 VIEW  COMPARISON:  05/23/2015  FINDINGS: Cardiac silhouette borderline enlarged but normal in configuration. No mediastinal or hilar masses or convincing adenopathy.  Lungs are clear.  No pleural effusion or pneumothorax.  Bony thorax is intact.  IMPRESSION: No acute cardiopulmonary disease.   Electronically Signed   By: Lajean Manes M.D.   On: 05/24/2015 11:40   Dg Chest 2 View  05/23/2015   CLINICAL DATA:  Shortness of breath.  EXAM: CHEST  2 VIEW  COMPARISON:  05/21/2014.  FINDINGS: Mediastinum and hilar structures normal. Cardiomegaly with bilateral pulmonary interstitial prominence consistent with congestive heart failure. Pneumonitis cannot be excluded. No pleural effusion or pneumothorax. Diffuse osteopenia with multiple mild stable thoracic spine compression fractures.  IMPRESSION: Cardiomegaly with bilateral pulmonary interstitial prominence consistent with congestive heart failure. Bilateral pneumonitis cannot be excluded.   Electronically Signed   By: Marcello Moores  Register   On: 05/23/2015 15:23   Ct Head Wo Contrast  05/30/2015   CLINICAL DATA:  Acute onset confusion  EXAM: CT HEAD WITHOUT CONTRAST  TECHNIQUE: Contiguous axial images were obtained from  the base of the skull through the vertex without intravenous contrast.  COMPARISON:  July 11, 2013  FINDINGS: From there is mild diffuse atrophy. There is no intracranial mass, hemorrhage, extra-axial fluid collection, or midline shift. There is slight small vessel disease in the centra semiovale bilaterally. Elsewhere gray-white compartments appear normal. No acute infarct apparent. Bony calvarium appears intact.  The mastoid air cells are clear.  IMPRESSION: Mild atrophy with slight periventricular small vessel disease. No intracranial mass, hemorrhage, or evidence of acute infarct.   Electronically Signed   By: Lowella Grip III M.D.   On: 05/30/2015 13:51   Ct Chest Wo Contrast  05/24/2015   CLINICAL DATA:  Shortness of breath and generalized weakness.  EXAM: CT CHEST WITHOUT CONTRAST  TECHNIQUE: Multidetector CT imaging of the chest was performed following the standard protocol without IV contrast.  COMPARISON:  Chest x-ray today as well as CT 12/24/2008  FINDINGS: Lungs are adequately inflated and demonstrate moderate airspace consolidation over the left lower lobe likely a pneumonia. Subtle linear atelectasis/scarring over the right lower lobe. Mild centrilobular emphysematous disease over the mid to upper lungs. Airways are within normal.  Mild prominence of the heart. There is a tiny amount of pericardial fluid present. Evidence of 3 vessel atherosclerotic coronary artery disease. No significant mediastinal, hilar or axillary adenopathy. Moderate calcified plaque over the thoracic aorta. Fluid within the distal esophagus which may be due to reflux versus dysmotility.  Images through the upper abdomen demonstrate focal calcification over the dome of the liver and right lobe the liver unchanged. There are degenerative changes of the spine.  IMPRESSION: Moderate airspace consolidation over the left lower lobe compatible with a pneumonia. Recommend follow-up chest radiograph in 4 weeks to document resolution.  Borderline cardiomegaly with 3 vessel atherosclerotic coronary artery disease. Tiny amount of pericardial fluid.  Fluid in the distal esophagus likely due to dysmotility or reflux.   Electronically Signed   By: Marin Olp M.D.   On: 05/24/2015 13:40   Dg Chest Port 1 View  05/25/2015   CLINICAL DATA:  Wheezing, shortness of breath  EXAM: PORTABLE CHEST - 1 VIEW  COMPARISON:  Chest CT 05/24/2015, chest  radiograph 05/24/2015  FINDINGS: The heart size at upper limits of normal. Lungs are hypoaerated with crowding of the bronchovascular markings. No focal pulmonary opacity. The visualized skeletal structures are unremarkable.  IMPRESSION: Low volume exam without focal acute finding. If symptoms persist, consider PA and lateral chest radiographs obtained at full inspiration when the patient is clinically able.   Electronically Signed   By: Conchita [place] M.D.   On: 05/25/2015 16:06    Microbiology: Recent Results (from the past 240 hour(s))  Urine culture     Status: None   Collection Time: 05/24/15 12:38 PM  Result Value Ref Range Status   Specimen Description URINE, RANDOM  Final   Special Requests NONE  Final   Colony Count   Final    >=100,000 COLONIES/ML Performed at [facility]    Culture   Final    ESCHERICHIA COLI Performed at [facility]    Report Status 05/26/2015 FINAL  Final   Organism ID, Bacteria ESCHERICHIA COLI  Final      Susceptibility   Escherichia coli - MIC*    AMPICILLIN <=2 SENSITIVE Sensitive     CEFAZOLIN <=4 SENSITIVE Sensitive     CEFTRIAXONE <=1 SENSITIVE Sensitive     CIPROFLOXACIN <=0.25 SENSITIVE Sensitive     GENTAMICIN <=1  SENSITIVE Sensitive     LEVOFLOXACIN <=0.12 SENSITIVE Sensitive     NITROFURANTOIN <=16 SENSITIVE Sensitive     TOBRAMYCIN <=1 SENSITIVE Sensitive     TRIMETH/SULFA <=20 SENSITIVE Sensitive     PIP/TAZO <=4 SENSITIVE Sensitive     * ESCHERICHIA COLI  Culture, blood (routine x 2) Call MD if unable to obtain prior to antibiotics being given     Status: None   Collection Time: 05/24/15  4:29 PM  Result Value Ref Range Status   Specimen Description BLOOD LEFT ARM  Final   Special Requests BOTTLES DRAWN AEROBIC ONLY 10CC  Final   Culture   Final    NO GROWTH 5 DAYS Performed at [facility]    Report Status 05/30/2015 FINAL  Final  Culture, blood (routine x 2) Call MD if unable to obtain prior to  antibiotics being given     Status: None   Collection Time: 05/24/15  4:33 PM  Result Value Ref Range Status   Specimen Description BLOOD LEFT HAND  Final   Special Requests BOTTLES DRAWN AEROBIC ONLY 10CC  Final   Culture   Final    NO GROWTH 5 DAYS Performed at [facility]    Report Status 05/30/2015 FINAL  Final     Labs: Basic Metabolic Panel:  Recent Labs Lab 05/27/15 0245 05/28/15 0805 05/28/15 1402 05/29/15 0820 05/30/15 0339 05/31/15 0335  NA 132*  --  133* 135 139 142  K 4.3  --  3.5 3.3* 3.2* 3.5  CL 96*  --  89* 87* 84* 84*  CO2 26  --  31 32 38* 45*  GLUCOSE 216* 493* 367* 316* 217* 65  BUN 40*  --  55* 63* 61* 53*  CREATININE 1.69*  --  1.86* 1.89* 1.92* 1.65*  CALCIUM 8.5*  --  9.1 8.8* 9.2 9.1   Liver Function Tests:  Recent Labs Lab 05/26/15 0323 05/29/15 0820  AST 159* 156*  ALT 72* 129*  ALKPHOS 58 84  BILITOT 0.9 0.8  PROT 7.1 7.1  ALBUMIN 2.3* 2.1*   No results for input(s): LIPASE, AMYLASE in the last 168 hours. No results for input(s): AMMONIA in the last 168 hours. CBC:  Recent Labs Lab 05/24/15 1120 05/25/15 0344 05/26/15 0323 05/27/15 0245 05/29/15 0820 05/31/15 0335  WBC 13.0* 14.4* 13.0* 11.0* 23.3* 15.0*  NEUTROABS 12.0* 13.6*  --   --   --   --   HGB 13.6 12.5 13.0 11.9* 14.4 15.4*  HCT 41.8 38.8 40.6 37.1 42.9 47.8*  MCV 89.1 88.8 89.4 87.9 85.6 88.0  PLT 136* 129* 130* 145* 240 337   Cardiac Enzymes:  Recent Labs Lab 05/29/15 0820  CKTOTAL 90   BNP: BNP (last 3 results)  Recent Labs  04/10/15 1305 05/24/15 1120 05/29/15 0820  BNP 165.4* 400.8* 227.9*    ProBNP (last 3 results) No results for input(s): PROBNP in the last 8760 hours.  CBG:  Recent Labs Lab 05/30/15 0754 05/30/15 1204 05/30/15 1650 05/30/15 2031 05/31/15 0737  GLUCAP 190* 172* 214* 153* 128*       Signed:  Vernell Leep, MD, FACP, FHM. [facility] Pager [phone]  If 7PM-7AM, please contact  night-coverage [email or web address] Password [email or web address] 05/31/2015, 11:08 AM

## 2015-06-01 NOTE — Care Management (Signed)
CM received call from RN as pt's daughter, Almyra Free,  states the CPAP mask was not transported to [place].  CM called [employer] (original supplier of CPAP) and DME rep, Jeneen Rinks states family may have to pay for another mask and will call Almyra Free [phone].  RN has called [facility] to see if mask was left in ambo.  CM called Almyra Free and unfortunately was not able to provide a mask this evening.  Almyra Free voiced her appreciation for the effort, albeit unsuccessful.

## 2015-06-02 ENCOUNTER — Encounter: Payer: Self-pay | Admitting: Adult Health

## 2015-06-02 ENCOUNTER — Non-Acute Institutional Stay (SKILLED_NURSING_FACILITY): Payer: Medicare Other | Admitting: Adult Health

## 2015-06-02 ENCOUNTER — Telehealth: Payer: Self-pay | Admitting: *Deleted

## 2015-06-02 DIAGNOSIS — R5381 Other malaise: Secondary | ICD-10-CM | POA: Diagnosis not present

## 2015-06-02 DIAGNOSIS — K5900 Constipation, unspecified: Secondary | ICD-10-CM

## 2015-06-02 DIAGNOSIS — J189 Pneumonia, unspecified organism: Secondary | ICD-10-CM

## 2015-06-02 DIAGNOSIS — R7989 Other specified abnormal findings of blood chemistry: Secondary | ICD-10-CM

## 2015-06-02 DIAGNOSIS — E0590 Thyrotoxicosis, unspecified without thyrotoxic crisis or storm: Secondary | ICD-10-CM

## 2015-06-02 DIAGNOSIS — I5033 Acute on chronic diastolic (congestive) heart failure: Secondary | ICD-10-CM

## 2015-06-02 DIAGNOSIS — E1165 Type 2 diabetes mellitus with hyperglycemia: Secondary | ICD-10-CM

## 2015-06-02 DIAGNOSIS — R945 Abnormal results of liver function studies: Secondary | ICD-10-CM

## 2015-06-02 DIAGNOSIS — N179 Acute kidney failure, unspecified: Secondary | ICD-10-CM | POA: Diagnosis not present

## 2015-06-02 DIAGNOSIS — N183 Chronic kidney disease, stage 3 unspecified: Secondary | ICD-10-CM

## 2015-06-02 DIAGNOSIS — G4733 Obstructive sleep apnea (adult) (pediatric): Secondary | ICD-10-CM | POA: Diagnosis not present

## 2015-06-02 DIAGNOSIS — IMO0002 Reserved for concepts with insufficient information to code with codable children: Secondary | ICD-10-CM

## 2015-06-02 DIAGNOSIS — Z9989 Dependence on other enabling machines and devices: Secondary | ICD-10-CM

## 2015-06-02 DIAGNOSIS — E876 Hypokalemia: Secondary | ICD-10-CM

## 2015-06-02 DIAGNOSIS — I483 Typical atrial flutter: Secondary | ICD-10-CM

## 2015-06-02 DIAGNOSIS — I10 Essential (primary) hypertension: Secondary | ICD-10-CM | POA: Diagnosis not present

## 2015-06-02 DIAGNOSIS — E782 Mixed hyperlipidemia: Secondary | ICD-10-CM

## 2015-06-02 DIAGNOSIS — J9601 Acute respiratory failure with hypoxia: Secondary | ICD-10-CM | POA: Diagnosis not present

## 2015-06-02 DIAGNOSIS — Z794 Long term (current) use of insulin: Secondary | ICD-10-CM

## 2015-06-02 DIAGNOSIS — B370 Candidal stomatitis: Secondary | ICD-10-CM

## 2015-06-02 DIAGNOSIS — I2510 Atherosclerotic heart disease of native coronary artery without angina pectoris: Secondary | ICD-10-CM

## 2015-06-02 NOTE — Progress Notes (Signed)
Patient ID: [name], female   DOB: [birth year], 81 y.o.   MRN: [id number]   06/02/2015  Facility:  Nursing Home Location:  [place] Room Number: 147-8 LEVEL OF CARE:  SNF (31)   Chief Complaint  Patient presents with  . Hospitalization Follow-up    Physical deconditioning, acute respiratory failure, CAP, diabetes mellitus, acute on chronic kidney disease stage III, acute on chronic diastolic heart failure, hypertension, atrial flutter, hyperthyroidism, OSA, abnormal LFTs, hypokalemia, hyperlipidemia, CAD and oral thrush    HISTORY OF PRESENT ILLNESS:  This is an 81 year old female was being admitted to [facility] on 05/31/15 from [facility]. She has PMH of hypertension, OSA, diabetes mellitus and atrial fibrillation. She was treated for pneumonia and heart failure exacerbation in the hospital. She had angioedema likely from Levofloxacin while in the hospital. Angioedema is now resolved.  She has been admitted for a short-term rehabilitation.  PAST MEDICAL HISTORY:  Past Medical History  Diagnosis Date  . Hypopotassemia     PMH of  . Anemia     iron defficiency  . Muscle pain   . Atrial fibrillation or flutter     s/p RFCA 7/08;   s/p DCCV in past;   previously on amiodarone;  amio stopped due to lung toxicity  . CAD (coronary artery disease)     s/p NSTEMI tx with BMS to OM1 3/08;  cath 3/08: pOM 99% tx with PCI, pLAD 20%, ? mod stenosis at the AM  . HLD (hyperlipidemia)   . HTN (hypertension)     essential nos  . Dystrophy, corneal stromal   . Chronic diastolic heart failure     echo 11/11:  EF 55-60%, severe LVH, mod LAE, mild MR, mildly increased PASP  . Osteoporosis   . Heart murmur   . Anginal pain   . Myocardial infarction 2008  . COPD (chronic obstructive pulmonary disease)   . Asthma   . Pneumonia 05/27/2015  . OSA on CPAP   . Type II diabetes mellitus     Dr Loanne Drilling  . Seasonal allergies   . Degenerative joint  disease   . Arthritis     "knees, feet, hands; joints" (05/27/2015)    CURRENT MEDICATIONS: Reviewed per MAR/see medication list  Allergies  Allergen Reactions  . Amlodipine Besylate Other (See Comments)    REACTION: tingling in lips & gum edema  . Levaquin [Levofloxacin] Shortness Of Breath and Swelling    angioedema  . Lobster [Shellfish Allergy] Other (See Comments)    angioedema  . Penicillins Rash  . Valsartan Other (See Comments)    REACTION: angioedema  . Codeine Other (See Comments)    Mental status changes  . Tramadol Hcl Nausea And Vomiting     REVIEW OF SYSTEMS:  GENERAL: no change in appetite, no fatigue, no weight changes, no fever, chills or weakness RESPIRATORY: no cough, SOB, DOE, wheezing, hemoptysis CARDIAC: no chest pain, edema or palpitations GI: no abdominal pain, diarrhea, constipation, heart burn, nausea or vomiting  PHYSICAL EXAMINATION  GENERAL: no acute distress, normal body habitus EYES: conjunctivae normal, sclerae normal, normal eye lids NECK: supple, trachea midline, no neck masses, no thyroid tenderness, no thyromegaly LYMPHATICS: no LAN in the neck, no supraclavicular LAN RESPIRATORY: breathing is even & unlabored, BS CTAB CARDIAC: Irregular heart rate, no murmur,no extra heart sounds, no edema GI: abdomen soft, normal BS, no masses, no tenderness, no hepatomegaly, no splenomegaly EXTREMITIES: Able to move 4 extremities  PSYCHIATRIC: the patient is alert & oriented to person, affect & behavior appropriate  LABS/RADIOLOGY: Labs reviewed: Basic Metabolic Panel:  Recent Labs  05/29/15 0820 05/30/15 0339 05/31/15 0335  NA 135 139 142  K 3.3* 3.2* 3.5  CL 87* 84* 84*  CO2 32 38* 45*  GLUCOSE 316* 217* 65  BUN 63* 61* 53*  CREATININE 1.89* 1.92* 1.65*  CALCIUM 8.8* 9.2 9.1   Liver Function Tests:  Recent Labs  08/21/14 0820 05/26/15 0323 05/29/15 0820  AST 29 159* 156*  ALT 21 72* 129*  ALKPHOS 85 58 84  BILITOT 0.4 0.9  0.8  PROT 8.2 7.1 7.1  ALBUMIN 3.7 2.3* 2.1*   CBC:  Recent Labs  05/23/15 1457 05/24/15 1120 05/25/15 0344  05/27/15 0245 05/29/15 0820 05/31/15 0335  WBC 14.4* 13.0* 14.4*  < > 11.0* 23.3* 15.0*  NEUTROABS 13.4* 12.0* 13.6*  --   --   --   --   HGB 13.7 13.6 12.5  < > 11.9* 14.4 15.4*  HCT 42.0 41.8 38.8  < > 37.1 42.9 47.8*  MCV 88.7 89.1 88.8  < > 87.9 85.6 88.0  PLT 136.0* 136* 129*  < > 145* 240 337  < > = values in this interval not displayed.  Lipid Panel:  Recent Labs  08/21/14 0820  HDL 53.90   Cardiac Enzymes:  Recent Labs  08/21/14 0820 05/23/15 1457 05/29/15 0820  CKTOTAL 106 779* 90   CBG:  Recent Labs  05/30/15 2031 05/31/15 0737 05/31/15 1123  GLUCAP 153* 128* 209*     Dg Chest 2 View  05/29/2015   CLINICAL DATA:  Shortness of breath, congestive heart failure  EXAM: CHEST  2 VIEW  COMPARISON:  05/25/2015  FINDINGS: Cardiomediastinal silhouette is stable. No pulmonary edema. There is hazy left basilar atelectasis or infiltrate best seen on lateral view. Mild degenerative changes thoracic spine.  IMPRESSION: No pulmonary edema. Hazy left basilar atelectasis or infiltrate best seen on lateral view.   Electronically Signed   By: Lahoma Crocker M.D.   On: 05/29/2015 09:06   Dg Chest 2 View  05/24/2015   CLINICAL DATA:  Dizziness, weakness, inability to walk, high blood sugarHx of congestion heart failure, DM, HTN, CAD, myocardial infarction  EXAM: CHEST  2 VIEW  COMPARISON:  05/23/2015  FINDINGS: Cardiac silhouette borderline enlarged but normal in configuration. No mediastinal or hilar masses or convincing adenopathy.  Lungs are clear.  No pleural effusion or pneumothorax.  Bony thorax is intact.  IMPRESSION: No acute cardiopulmonary disease.   Electronically Signed   By: Lajean Manes M.D.   On: 05/24/2015 11:40   Dg Chest 2 View  05/23/2015   CLINICAL DATA:  Shortness of breath.  EXAM: CHEST  2 VIEW  COMPARISON:  05/21/2014.  FINDINGS: Mediastinum and  hilar structures normal. Cardiomegaly with bilateral pulmonary interstitial prominence consistent with congestive heart failure. Pneumonitis cannot be excluded. No pleural effusion or pneumothorax. Diffuse osteopenia with multiple mild stable thoracic spine compression fractures.  IMPRESSION: Cardiomegaly with bilateral pulmonary interstitial prominence consistent with congestive heart failure. Bilateral pneumonitis cannot be excluded.   Electronically Signed   By: Marcello Moores  Register   On: 05/23/2015 15:23   Ct Head Wo Contrast  05/30/2015   CLINICAL DATA:  Acute onset confusion  EXAM: CT HEAD WITHOUT CONTRAST  TECHNIQUE: Contiguous axial images were obtained from the base of the skull through the vertex without intravenous contrast.  COMPARISON:  July 11, 2013  FINDINGS: From there  is mild diffuse atrophy. There is no intracranial mass, hemorrhage, extra-axial fluid collection, or midline shift. There is slight small vessel disease in the centra semiovale bilaterally. Elsewhere gray-white compartments appear normal. No acute infarct apparent. Bony calvarium appears intact. The mastoid air cells are clear.  IMPRESSION: Mild atrophy with slight periventricular small vessel disease. No intracranial mass, hemorrhage, or evidence of acute infarct.   Electronically Signed   By: Lowella Grip III M.D.   On: 05/30/2015 13:51   Ct Chest Wo Contrast  05/24/2015   CLINICAL DATA:  Shortness of breath and generalized weakness.  EXAM: CT CHEST WITHOUT CONTRAST  TECHNIQUE: Multidetector CT imaging of the chest was performed following the standard protocol without IV contrast.  COMPARISON:  Chest x-ray today as well as CT 12/24/2008  FINDINGS: Lungs are adequately inflated and demonstrate moderate airspace consolidation over the left lower lobe likely a pneumonia. Subtle linear atelectasis/scarring over the right lower lobe. Mild centrilobular emphysematous disease over the mid to upper lungs. Airways are within normal.   Mild prominence of the heart. There is a tiny amount of pericardial fluid present. Evidence of 3 vessel atherosclerotic coronary artery disease. No significant mediastinal, hilar or axillary adenopathy. Moderate calcified plaque over the thoracic aorta. Fluid within the distal esophagus which may be due to reflux versus dysmotility.  Images through the upper abdomen demonstrate focal calcification over the dome of the liver and right lobe the liver unchanged. There are degenerative changes of the spine.  IMPRESSION: Moderate airspace consolidation over the left lower lobe compatible with a pneumonia. Recommend follow-up chest radiograph in 4 weeks to document resolution.  Borderline cardiomegaly with 3 vessel atherosclerotic coronary artery disease. Tiny amount of pericardial fluid.  Fluid in the distal esophagus likely due to dysmotility or reflux.   Electronically Signed   By: Marin Olp M.D.   On: 05/24/2015 13:40   Dg Chest Port 1 View  05/25/2015   CLINICAL DATA:  Wheezing, shortness of breath  EXAM: PORTABLE CHEST - 1 VIEW  COMPARISON:  Chest CT 05/24/2015, chest radiograph 05/24/2015  FINDINGS: The heart size at upper limits of normal. Lungs are hypoaerated with crowding of the bronchovascular markings. No focal pulmonary opacity. The visualized skeletal structures are unremarkable.  IMPRESSION: Low volume exam without focal acute finding. If symptoms persist, consider PA and lateral chest radiographs obtained at full inspiration when the patient is clinically able.   Electronically Signed   By: Conchita Paris M.D.   On: 05/25/2015 16:06    ASSESSMENT/PLAN:  Physical deconditioning - for rehabilitation Acute respiratory failure - 102 at 2 L/minute via [place]; titrate O2 as tolerated; check O2 sat every shift 1 week CAP - completed 7 days course of the antibiotic; chest x-ray in 4 weeks Diabetes mellitus, type II uncontrolled - hemoglobin A1c 7.9; continue Lantus 25 units subcutaneous daily and  NovoLog sliding scale 3 times a day before meals Acute on chronic kidney disease is stage III - creatinine 1.65; will monitor; nephrology consultation Acute on chronic diastolic heart failure - stable; continue Demadex 20 mg 2 tabs = 40 mg by mouth daily; weigh on Mondays and Wednesdays Hypertension - well controlled; continue Demadex 20 mg 2 tabs = 40 mg by mouth daily and carvedilol 12.5 mg by mouth twice a day Atrial flutter - rate controlled; continue carvedilol 12.5 mg by mouth twice a day and eliquis 5 mg 1/2 tab = 2.5 mg by mouth twice a day Hyperthyroidism - TSH 4.04; continue Tapazole 5  mg by mouth 3 times a day OSA - continue CPAP at bedtime Abnormal LFTs - will recheck LFTs Hypokalemia - K3.5; continue K-Dur 20 meq by mouth daily Hyperlipidemia - statins on hold secondary to abnormal LFTs CAD - stable; on Eliquis, NTG when necessary and carvedilol  Oral thrush  - continue Diflucan 100 mg 1 tab by mouth daily till6/7  Constipation - continue Senna-S 2 tabs PO BID   Goals of care:  Short-term rehabilitation   Labs/test ordered:   Awaiting result of CBC and CMP done this morning  Spent 50 minutes in patient care.    [facility], NP [employer] [phone]

## 2015-06-02 NOTE — Telephone Encounter (Signed)
Pt was on tcm list d.c 05/31/15 and sent to SNF. Did not make appt for PCP...[name]

## 2015-06-03 ENCOUNTER — Encounter: Payer: Self-pay | Admitting: Internal Medicine

## 2015-06-03 ENCOUNTER — Non-Acute Institutional Stay (SKILLED_NURSING_FACILITY): Payer: Medicare Other | Admitting: Internal Medicine

## 2015-06-03 DIAGNOSIS — R682 Dry mouth, unspecified: Secondary | ICD-10-CM

## 2015-06-03 DIAGNOSIS — J189 Pneumonia, unspecified organism: Secondary | ICD-10-CM | POA: Diagnosis not present

## 2015-06-03 DIAGNOSIS — R945 Abnormal results of liver function studies: Secondary | ICD-10-CM

## 2015-06-03 DIAGNOSIS — B370 Candidal stomatitis: Secondary | ICD-10-CM | POA: Diagnosis not present

## 2015-06-03 DIAGNOSIS — Z794 Long term (current) use of insulin: Secondary | ICD-10-CM

## 2015-06-03 DIAGNOSIS — I482 Chronic atrial fibrillation, unspecified: Secondary | ICD-10-CM

## 2015-06-03 DIAGNOSIS — J9601 Acute respiratory failure with hypoxia: Secondary | ICD-10-CM

## 2015-06-03 DIAGNOSIS — K5900 Constipation, unspecified: Secondary | ICD-10-CM

## 2015-06-03 DIAGNOSIS — D72829 Elevated white blood cell count, unspecified: Secondary | ICD-10-CM

## 2015-06-03 DIAGNOSIS — G4733 Obstructive sleep apnea (adult) (pediatric): Secondary | ICD-10-CM

## 2015-06-03 DIAGNOSIS — I5033 Acute on chronic diastolic (congestive) heart failure: Secondary | ICD-10-CM | POA: Diagnosis not present

## 2015-06-03 DIAGNOSIS — IMO0002 Reserved for concepts with insufficient information to code with codable children: Secondary | ICD-10-CM

## 2015-06-03 DIAGNOSIS — K7689 Other specified diseases of liver: Secondary | ICD-10-CM | POA: Diagnosis not present

## 2015-06-03 DIAGNOSIS — N179 Acute kidney failure, unspecified: Secondary | ICD-10-CM

## 2015-06-03 DIAGNOSIS — N183 Chronic kidney disease, stage 3 (moderate): Secondary | ICD-10-CM

## 2015-06-03 DIAGNOSIS — J181 Lobar pneumonia, unspecified organism: Secondary | ICD-10-CM

## 2015-06-03 DIAGNOSIS — E0590 Thyrotoxicosis, unspecified without thyrotoxic crisis or storm: Secondary | ICD-10-CM | POA: Diagnosis not present

## 2015-06-03 DIAGNOSIS — E1165 Type 2 diabetes mellitus with hyperglycemia: Secondary | ICD-10-CM

## 2015-06-03 DIAGNOSIS — Z9989 Dependence on other enabling machines and devices: Secondary | ICD-10-CM

## 2015-06-03 DIAGNOSIS — R5381 Other malaise: Secondary | ICD-10-CM

## 2015-06-03 NOTE — Progress Notes (Signed)
Patient ID: [name], female   DOB: 02-01-1931, 81 y.o.   MRN: [id number]    [place]  PCP: [name], MD  Code Status: DNR  Allergies  Allergen Reactions  . Amlodipine Besylate Other (See Comments)    REACTION: tingling in lips & gum edema  . Levaquin [Levofloxacin] Shortness Of Breath and Swelling    angioedema  . Lobster [Shellfish Allergy] Other (See Comments)    angioedema  . Penicillins Rash  . Valsartan Other (See Comments)    REACTION: angioedema  . Codeine Other (See Comments)    Mental status changes  . Tramadol Hcl Nausea And Vomiting    Chief Complaint  Patient presents with  . New Admit To SNF    New Admission      HPI:  81 year old patient is here for short term rehabilitation post hospital admission from 05/24/15- 05/31/15 with acute respiratory failure in setting of left lower lobe pneumonia and decompensated CHF. She was put on levaquin for her pneumonia and e.coli UTI. She developed angioedema from levaquin and antibiotic was discontinued and switched to clindamycin. She also had oral thrush and was started on diflucan. She is seen in her room today. She feels weak and tired but worked well with therapy this am. Her mouth is dry, denies any soreness or difficulty with swallowing. She has misplaced the mask of her cpap machine and is thus not able to use it at night since discharge from hospital. She is currently on oxygen. Denies any other concerns. She has PMH of afib, HTN, CHF, OSA, hyperthyroidism among others  Review of Systems:  Constitutional: positive for fatigue. Negative for fever, chills, diaphoresis.  HENT: Negative for headache, congestion, nasal discharge Eyes: Negative for eye pain, blurred vision, double vision and discharge.  Respiratory: Negative for cough, shortness of breath and wheezing.   Cardiovascular: Negative for chest pain, palpitations, leg swelling.  Gastrointestinal: Negative for heartburn, nausea,  vomiting, abdominal pain Genitourinary: Negative for dysuria Musculoskeletal: Negative for back pain, falls Skin: Negative for itching, rash.  Neurological: Negative for dizziness, tingling, focal weakness Psychiatric/Behavioral: Negative for depression    Past Medical History  Diagnosis Date  . Hypopotassemia     PMH of  . Anemia     iron defficiency  . Muscle pain   . Atrial fibrillation or flutter     s/p RFCA 7/08;   s/p DCCV in past;   previously on amiodarone;  amio stopped due to lung toxicity  . CAD (coronary artery disease)     s/p NSTEMI tx with BMS to OM1 3/08;  cath 3/08: pOM 99% tx with PCI, pLAD 20%, ? mod stenosis at the AM  . HLD (hyperlipidemia)   . HTN (hypertension)     essential nos  . Dystrophy, corneal stromal   . Chronic diastolic heart failure     echo 11/11:  EF 55-60%, severe LVH, mod LAE, mild MR, mildly increased PASP  . Osteoporosis   . Heart murmur   . Anginal pain   . Myocardial infarction 2008  . COPD (chronic obstructive pulmonary disease)   . Asthma   . Pneumonia 05/27/2015  . OSA on CPAP   . Type II diabetes mellitus     Dr Loanne Drilling  . Seasonal allergies   . Degenerative joint disease   . Arthritis     "knees, feet, hands; joints" (05/27/2015)   Past Surgical History  Procedure Laterality Date  . Cholecystectomy    .  Knee cartilage surgery Right   . Eye surgery    . A flutter ablation      Dr Lovena Le  . Gravid 2 para 2    . Colonoscopy  6/07    2 polyps Dr. Kinnie Feil in [place]  . Coronary angioplasty with stent placement  02/2007    BMS w/Dr Lovena Le  . Colonoscopy with polypectomy       X 2; [facility]  . Left and right heart catheterization with coronary angiogram N/A 05/07/2014    Procedure: LEFT AND RIGHT HEART CATHETERIZATION WITH CORONARY ANGIOGRAM;  Surgeon: Peter M Martinique, MD;  Location: [facility] CATH LAB;  Service: Cardiovascular;  Laterality: N/A;  . Cataract extraction w/ intraocular lens  implant, bilateral Bilateral   . Corneal  transplant Bilateral    Social History:   reports that she quit smoking about 46 years ago. Her smoking use included Cigarettes. She has a 4.5 pack-year smoking history. She has never used smokeless tobacco. She reports that she does not drink alcohol or use illicit drugs.  Family History  Problem Relation Age of Onset  . Diabetes Mother   . Hypertension Mother   . Transient ischemic attack Mother   . Heart attack Father 99  . Arthritis Father   . Breast cancer Maternal Aunt   . Arthritis Maternal Aunt   . Arthritis Mother   . Hypertension Father     Medications: Patient's Medications  New Prescriptions   No medications on file  Previous Medications   ALBUTEROL (PROVENTIL) (2.5 MG/3ML) 0.083% NEBULIZER SOLUTION    Take 3 mLs (2.5 mg total) by nebulization every 4 (four) hours as needed for wheezing or shortness of breath.   APIXABAN (ELIQUIS) 5 MG TABS TABLET    Take 0.5 tablets (2.5 mg total) by mouth 2 (two) times daily.   CARVEDILOL (COREG) 12.5 MG TABLET    Take 1 tablet (12.5 mg total) by mouth 2 (two) times daily with a meal.   FLUCONAZOLE (DIFLUCAN) 100 MG TABLET    Take 1 tablet (100 mg total) by mouth daily. Discontinue after 06/03/2015 dose.   INSULIN ASPART (NOVOLOG) 100 UNIT/ML INJECTION    Inject 0-15 Units into the skin 3 (three) times daily with meals. Correction coverage: Moderate (average weight, post-op) CBG < 70: implement hypoglycemia protocol CBG 70 - 120: 0 units CBG 121 - 150: 2 units CBG 151 - 200: 3 units CBG 201 - 250: 5 units CBG 251 - 300: 8 units CBG 301 - 350: 11 units CBG 351 - 400: 15 units CBG > 400: call MD.   INSULIN GLARGINE (LANTUS) 100 UNIT/ML INJECTION    Inject 0.25 mLs (25 Units total) into the skin daily.   METHIMAZOLE (TAPAZOLE) 5 MG TABLET    Take 5 mg by mouth 3 (three) times a week.    NITROGLYCERIN (NITROSTAT) 0.4 MG SL TABLET    Place 0.4 mg under the tongue every 5 (five) minutes as needed for chest pain.   OXYGEN    Inhale 2 L  into the lungs. Check O2 stats every shift   POTASSIUM CHLORIDE SA (K-DUR,KLOR-CON) 20 MEQ TABLET    Take 1 tablet (20 mEq total) by mouth daily.   PROTEIN (PROCEL 100) POWD    Take 1 scoop by mouth 2 (two) times daily. For nutritional support and increase to protein   SENNOSIDES-DOCUSATE SODIUM (SENOKOT-S) 8.6-50 MG TABLET    Take 1 tablet by mouth 2 (two) times daily.   TIOTROPIUM (SPIRIVA) 18  MCG INHALATION CAPSULE    Place 1 capsule (18 mcg total) into inhaler and inhale daily.   TORSEMIDE (DEMADEX PO)    Take 40 mg by mouth daily.  Modified Medications   No medications on file  Discontinued Medications   TORSEMIDE (DEMADEX) 20 MG TABLET    Take 2 tablets (40 mg total) by mouth daily.     Physical Exam: Filed Vitals:   06/03/15 0929  BP: 128/66  Pulse: 72  Temp: 97.1 F (36.2 C)  TempSrc: Oral  Resp: 16  Weight: 171 lb 6.4 oz (77.747 kg)  SpO2: 93%    General- elderly female, in no acute distress Head- normocephalic, atraumatic Nose- normal nasal mucosa, no maxillary or frontal sinus tenderness, no nasal discharge Throat- moist mucus membrane, has thrush at back of buccal mucosa, no open lesions Eyes- PERRLA, EOMI, no pallor, no icterus, no discharge, normal conjunctiva, normal sclera Neck- no cervical lymphadenopathy, no jugular vein distension Cardiovascular- normal s1,s2, no murmurs, palpable dorsalis pedis and radial pulses, no leg edema Respiratory- bilateral clear to auscultation, no wheeze, no rhonchi, no crackles, no use of accessory muscles, on o2 Abdomen- bowel sounds present, soft, non tender Musculoskeletal- able to move all 4 extremities, generalized weakness  Neurological- no focal deficit Skin- warm and dry, some bruising in right arm Psychiatry- alert and oriented to person, place and time, normal mood and affect    Labs reviewed: Basic Metabolic Panel:  Recent Labs  05/29/15 0820 05/30/15 0339 05/31/15 0335  NA 135 139 142  K 3.3* 3.2* 3.5  CL  87* 84* 84*  CO2 32 38* 45*  GLUCOSE 316* 217* 65  BUN 63* 61* 53*  CREATININE 1.89* 1.92* 1.65*  CALCIUM 8.8* 9.2 9.1   Liver Function Tests:  Recent Labs  08/21/14 0820 05/26/15 0323 05/29/15 0820  AST 29 159* 156*  ALT 21 72* 129*  ALKPHOS 85 58 84  BILITOT 0.4 0.9 0.8  PROT 8.2 7.1 7.1  ALBUMIN 3.7 2.3* 2.1*   No results for input(s): LIPASE, AMYLASE in the last 8760 hours. No results for input(s): AMMONIA in the last 8760 hours. CBC:  Recent Labs  05/23/15 1457 05/24/15 1120 05/25/15 0344  05/27/15 0245 05/29/15 0820 05/31/15 0335  WBC 14.4* 13.0* 14.4*  < > 11.0* 23.3* 15.0*  NEUTROABS 13.4* 12.0* 13.6*  --   --   --   --   HGB 13.7 13.6 12.5  < > 11.9* 14.4 15.4*  HCT 42.0 41.8 38.8  < > 37.1 42.9 47.8*  MCV 88.7 89.1 88.8  < > 87.9 85.6 88.0  PLT 136.0* 136* 129*  < > 145* 240 337  < > = values in this interval not displayed. Cardiac Enzymes:  Recent Labs  08/21/14 0820 05/23/15 1457 05/29/15 0820  CKTOTAL 106 779* 90   BNP: Invalid input(s): POCBNP CBG:  Recent Labs  05/30/15 2031 05/31/15 0737 05/31/15 1123  GLUCAP 153* 128* 209*    Radiological Exams:     Assessment/Plan  Physical deconditioning Will have her work with physical therapy and occupational therapy team to help with gait training and muscle strengthening exercises.fall precautions. Skin care. Encourage to be out of bed.   Acute respiratory failure From pneumonia and decompensated diastolic chf. Clinically improved. Continue o2 2l/min for now with spiriva and prn albuterol  CAP Clinically improved. No cough or fever. Continue o2. Has completed her antibiotics. Add incentive spirometer  Diastolic chf With recent acute exacerbation. Continue carvedilol 12.5 mg bid, demadex  40 mg daily, kcl supplement. monitor weight and bmp  Acute on chronic renal disease Has ckd stage 3, monitor renal function  Oral thrush On diflucan until today, with thrush present, increase it  for another 5 days until 06/08/15. Add nystatin swish and spit qid for a week and to provide oral hygiene post every meal  Dry mouth Add biotene spray tid prn and reassess  Hyperthyroidism Stable, continue tapazole 5 mg tid  Diabetes mellitus, type II  hemoglobin A1c 7.9. Monitor cbg and continue Lantus 25 units subcutaneous daily and NovoLog sliding scale tid with meals  afib rate controlled.continue carvedilol 12.5 mg bid for rate control and eliquis 2.5 mg bid for anticoagulation  OSA  Continue o2 for now until we are able to get a mask for her CPAP   Abnormal LFTs Unclear cause, monitor lft   Constipation  continue Senna-S 2 tabs PO BID  Leukocytosis Afebrile, monitor clinically, check cbc with diff   Goals of care: short term rehabilitation   Labs/tests ordered: CMP, cbc with diff  Family/ staff Communication: reviewed care plan with patient and nursing supervisor    Blanchie Serve, MD  [facility] [phone] (Monday-Friday 8 am - 5 pm) [phone] (afterhours)

## 2015-06-04 ENCOUNTER — Telehealth: Payer: Self-pay | Admitting: Pulmonary Disease

## 2015-06-04 DIAGNOSIS — G4733 Obstructive sleep apnea (adult) (pediatric): Secondary | ICD-10-CM

## 2015-06-04 NOTE — Telephone Encounter (Signed)
Patient has CPAP machine.  She has recently been hospitalized for Pneumonia and was transferred to [facility]  The machine went with her, but not the mask.  [place] asked them to pay the balance on the machine, they took the chip from the machine and asked that we order a new mask for patient.   Order entered for new mask.  Message sent to [facility] to expedite order.  Nothing further needed.

## 2015-06-10 ENCOUNTER — Other Ambulatory Visit: Payer: Self-pay | Admitting: Internal Medicine

## 2015-06-13 ENCOUNTER — Non-Acute Institutional Stay (SKILLED_NURSING_FACILITY): Payer: Medicare Other | Admitting: Adult Health

## 2015-06-13 ENCOUNTER — Encounter: Payer: Self-pay | Admitting: Adult Health

## 2015-06-13 DIAGNOSIS — E782 Mixed hyperlipidemia: Secondary | ICD-10-CM | POA: Diagnosis not present

## 2015-06-13 DIAGNOSIS — E1165 Type 2 diabetes mellitus with hyperglycemia: Secondary | ICD-10-CM

## 2015-06-13 DIAGNOSIS — E0590 Thyrotoxicosis, unspecified without thyrotoxic crisis or storm: Secondary | ICD-10-CM

## 2015-06-13 DIAGNOSIS — K5900 Constipation, unspecified: Secondary | ICD-10-CM | POA: Diagnosis not present

## 2015-06-13 DIAGNOSIS — N179 Acute kidney failure, unspecified: Secondary | ICD-10-CM

## 2015-06-13 DIAGNOSIS — G4733 Obstructive sleep apnea (adult) (pediatric): Secondary | ICD-10-CM

## 2015-06-13 DIAGNOSIS — J189 Pneumonia, unspecified organism: Secondary | ICD-10-CM

## 2015-06-13 DIAGNOSIS — N183 Chronic kidney disease, stage 3 (moderate): Secondary | ICD-10-CM

## 2015-06-13 DIAGNOSIS — E876 Hypokalemia: Secondary | ICD-10-CM | POA: Diagnosis not present

## 2015-06-13 DIAGNOSIS — Z794 Long term (current) use of insulin: Secondary | ICD-10-CM

## 2015-06-13 DIAGNOSIS — Z9989 Dependence on other enabling machines and devices: Secondary | ICD-10-CM

## 2015-06-13 DIAGNOSIS — I483 Typical atrial flutter: Secondary | ICD-10-CM | POA: Diagnosis not present

## 2015-06-13 DIAGNOSIS — R7989 Other specified abnormal findings of blood chemistry: Secondary | ICD-10-CM | POA: Diagnosis not present

## 2015-06-13 DIAGNOSIS — I2510 Atherosclerotic heart disease of native coronary artery without angina pectoris: Secondary | ICD-10-CM

## 2015-06-13 DIAGNOSIS — R5381 Other malaise: Secondary | ICD-10-CM

## 2015-06-13 DIAGNOSIS — IMO0002 Reserved for concepts with insufficient information to code with codable children: Secondary | ICD-10-CM

## 2015-06-13 DIAGNOSIS — I5032 Chronic diastolic (congestive) heart failure: Secondary | ICD-10-CM

## 2015-06-13 DIAGNOSIS — I10 Essential (primary) hypertension: Secondary | ICD-10-CM

## 2015-06-13 DIAGNOSIS — R945 Abnormal results of liver function studies: Secondary | ICD-10-CM

## 2015-06-13 NOTE — Progress Notes (Signed)
Patient ID: [name], female   DOB: [birth year], 81 y.o.   MRN: [id number]   06/13/2015  Facility:  Nursing Home Location:  [place] Room Number: 629-4 LEVEL OF CARE:  SNF (31)   Chief Complaint  Patient presents with  . Discharge Note    Physical deconditioning, CAP, diabetes mellitus, acute on chronic kidney disease stage III, acute on chronic diastolic heart failure, hypertension, atrial flutter, hyperthyroidism, OSA, abnormal LFTs, hypokalemia, hyperlipidemia and CAD     HISTORY OF PRESENT ILLNESS:  This is an 81 year old female who is for discharge home with [facility] for cognition, PT for endurance, OT for ADLs and CNA for showers. DME:  Rolling walker. She has been admitted to [facility] on 05/31/15 from [facility]. She has PMH of hypertension, OSA, diabetes mellitus and atrial fibrillation. She was treated for pneumonia and heart failure exacerbation in the hospital. She had angioedema likely from Levofloxacin while in the hospital. Angioedema is now resolved.  Patient was admitted to this facility for short-term rehabilitation after the patient's recent hospitalization.  Patient has completed SNF rehabilitation and therapy has cleared the patient for discharge.   PAST MEDICAL HISTORY:  Past Medical History  Diagnosis Date  . Hypopotassemia     PMH of  . Anemia     iron defficiency  . Muscle pain   . Atrial fibrillation or flutter     s/p RFCA 7/08;   s/p DCCV in past;   previously on amiodarone;  amio stopped due to lung toxicity  . CAD (coronary artery disease)     s/p NSTEMI tx with BMS to OM1 3/08;  cath 3/08: pOM 99% tx with PCI, pLAD 20%, ? mod stenosis at the AM  . HLD (hyperlipidemia)   . HTN (hypertension)     essential nos  . Dystrophy, corneal stromal   . Chronic diastolic heart failure     echo 11/11:  EF 55-60%, severe LVH, mod LAE, mild MR, mildly increased PASP  . Osteoporosis   . Heart murmur   .  Anginal pain   . Myocardial infarction 2008  . COPD (chronic obstructive pulmonary disease)   . Asthma   . Pneumonia 05/27/2015  . OSA on CPAP   . Type II diabetes mellitus     Dr Loanne Drilling  . Seasonal allergies   . Degenerative joint disease   . Arthritis     "knees, feet, hands; joints" (05/27/2015)    CURRENT MEDICATIONS: Reviewed per MAR/see medication list  Allergies  Allergen Reactions  . Amlodipine Besylate Other (See Comments)    REACTION: tingling in lips & gum edema  . Levaquin [Levofloxacin] Shortness Of Breath and Swelling    angioedema  . Lobster [Shellfish Allergy] Other (See Comments)    angioedema  . Penicillins Rash  . Valsartan Other (See Comments)    REACTION: angioedema  . Codeine Other (See Comments)    Mental status changes  . Tramadol Hcl Nausea And Vomiting     REVIEW OF SYSTEMS:  GENERAL: no change in appetite, no fatigue, no weight changes, no fever, chills or weakness RESPIRATORY: no cough, SOB, DOE, wheezing, hemoptysis CARDIAC: no chest pain, edema or palpitations GI: no abdominal pain, diarrhea, constipation, heart burn, nausea or vomiting  PHYSICAL EXAMINATION  GENERAL: no acute distress, normal body habitus NECK: supple, trachea midline, no neck masses, no thyroid tenderness, no thyromegaly LYMPHATICS: no LAN in the neck, no supraclavicular LAN RESPIRATORY: breathing is  even & unlabored, BS CTAB CARDIAC: Irregular heart rate, no murmur,no extra heart sounds, no edema GI: abdomen soft, normal BS, no masses, no tenderness, no hepatomegaly, no splenomegaly EXTREMITIES: Able to move 4 extremities PSYCHIATRIC: the patient is alert & oriented to person, affect & behavior appropriate  LABS/RADIOLOGY: Labs reviewed: 06/12/15  WBC 4.3 hemoglobin 14.0 hematocrit 43.8 MCV 91.1 platelet 162 sodium 140 potassium 4.1 glucose 154 BUN 23 creatinine 1.12 calcium 9.1 06/02/15  WBC 12.8 hemoglobin 14.9 hematocrit 47.2 MCV 89.9Total bilirubin 0.9 alkaline  phosphatase 81 SGOT 41 SGPT 52 total protein 5.8 out. 2.4 calcium 8.5 Basic Metabolic Panel:  Recent Labs  05/29/15 0820 05/30/15 0339 05/31/15 0335  NA 135 139 142  K 3.3* 3.2* 3.5  CL 87* 84* 84*  CO2 32 38* 45*  GLUCOSE 316* 217* 65  BUN 63* 61* 53*  CREATININE 1.89* 1.92* 1.65*  CALCIUM 8.8* 9.2 9.1   Liver Function Tests:  Recent Labs  08/21/14 0820 05/26/15 0323 05/29/15 0820  AST 29 159* 156*  ALT 21 72* 129*  ALKPHOS 85 58 84  BILITOT 0.4 0.9 0.8  PROT 8.2 7.1 7.1  ALBUMIN 3.7 2.3* 2.1*   CBC:  Recent Labs  05/23/15 1457 05/24/15 1120 05/25/15 0344  05/27/15 0245 05/29/15 0820 05/31/15 0335  WBC 14.4* 13.0* 14.4*  < > 11.0* 23.3* 15.0*  NEUTROABS 13.4* 12.0* 13.6*  --   --   --   --   HGB 13.7 13.6 12.5  < > 11.9* 14.4 15.4*  HCT 42.0 41.8 38.8  < > 37.1 42.9 47.8*  MCV 88.7 89.1 88.8  < > 87.9 85.6 88.0  PLT 136.0* 136* 129*  < > 145* 240 337  < > = values in this interval not displayed.  Lipid Panel:  Recent Labs  08/21/14 0820  HDL 53.90   Cardiac Enzymes:  Recent Labs  08/21/14 0820 05/23/15 1457 05/29/15 0820  CKTOTAL 106 779* 90   CBG:  Recent Labs  05/30/15 2031 05/31/15 0737 05/31/15 1123  GLUCAP 153* 128* 209*     Dg Chest 2 View  05/29/2015   CLINICAL DATA:  Shortness of breath, congestive heart failure  EXAM: CHEST  2 VIEW  COMPARISON:  05/25/2015  FINDINGS: Cardiomediastinal silhouette is stable. No pulmonary edema. There is hazy left basilar atelectasis or infiltrate best seen on lateral view. Mild degenerative changes thoracic spine.  IMPRESSION: No pulmonary edema. Hazy left basilar atelectasis or infiltrate best seen on lateral view.   Electronically Signed   By: Lahoma Crocker M.D.   On: 05/29/2015 09:06   Dg Chest 2 View  05/24/2015   CLINICAL DATA:  Dizziness, weakness, inability to walk, high blood sugarHx of congestion heart failure, DM, HTN, CAD, myocardial infarction  EXAM: CHEST  2 VIEW  COMPARISON:  05/23/2015   FINDINGS: Cardiac silhouette borderline enlarged but normal in configuration. No mediastinal or hilar masses or convincing adenopathy.  Lungs are clear.  No pleural effusion or pneumothorax.  Bony thorax is intact.  IMPRESSION: No acute cardiopulmonary disease.   Electronically Signed   By: Lajean Manes M.D.   On: 05/24/2015 11:40   Dg Chest 2 View  05/23/2015   CLINICAL DATA:  Shortness of breath.  EXAM: CHEST  2 VIEW  COMPARISON:  05/21/2014.  FINDINGS: Mediastinum and hilar structures normal. Cardiomegaly with bilateral pulmonary interstitial prominence consistent with congestive heart failure. Pneumonitis cannot be excluded. No pleural effusion or pneumothorax. Diffuse osteopenia with multiple mild stable thoracic spine compression  fractures.  IMPRESSION: Cardiomegaly with bilateral pulmonary interstitial prominence consistent with congestive heart failure. Bilateral pneumonitis cannot be excluded.   Electronically Signed   By: Marcello Moores  Register   On: 05/23/2015 15:23   Ct Head Wo Contrast  05/30/2015   CLINICAL DATA:  Acute onset confusion  EXAM: CT HEAD WITHOUT CONTRAST  TECHNIQUE: Contiguous axial images were obtained from the base of the skull through the vertex without intravenous contrast.  COMPARISON:  July 11, 2013  FINDINGS: From there is mild diffuse atrophy. There is no intracranial mass, hemorrhage, extra-axial fluid collection, or midline shift. There is slight small vessel disease in the centra semiovale bilaterally. Elsewhere gray-white compartments appear normal. No acute infarct apparent. Bony calvarium appears intact. The mastoid air cells are clear.  IMPRESSION: Mild atrophy with slight periventricular small vessel disease. No intracranial mass, hemorrhage, or evidence of acute infarct.   Electronically Signed   By: Lowella Grip III M.D.   On: 05/30/2015 13:51   Ct Chest Wo Contrast  05/24/2015   CLINICAL DATA:  Shortness of breath and generalized weakness.  EXAM: CT CHEST WITHOUT  CONTRAST  TECHNIQUE: Multidetector CT imaging of the chest was performed following the standard protocol without IV contrast.  COMPARISON:  Chest x-ray today as well as CT 12/24/2008  FINDINGS: Lungs are adequately inflated and demonstrate moderate airspace consolidation over the left lower lobe likely a pneumonia. Subtle linear atelectasis/scarring over the right lower lobe. Mild centrilobular emphysematous disease over the mid to upper lungs. Airways are within normal.  Mild prominence of the heart. There is a tiny amount of pericardial fluid present. Evidence of 3 vessel atherosclerotic coronary artery disease. No significant mediastinal, hilar or axillary adenopathy. Moderate calcified plaque over the thoracic aorta. Fluid within the distal esophagus which may be due to reflux versus dysmotility.  Images through the upper abdomen demonstrate focal calcification over the dome of the liver and right lobe the liver unchanged. There are degenerative changes of the spine.  IMPRESSION: Moderate airspace consolidation over the left lower lobe compatible with a pneumonia. Recommend follow-up chest radiograph in 4 weeks to document resolution.  Borderline cardiomegaly with 3 vessel atherosclerotic coronary artery disease. Tiny amount of pericardial fluid.  Fluid in the distal esophagus likely due to dysmotility or reflux.   Electronically Signed   By: Marin Olp M.D.   On: 05/24/2015 13:40   Dg Chest Port 1 View  05/25/2015   CLINICAL DATA:  Wheezing, shortness of breath  EXAM: PORTABLE CHEST - 1 VIEW  COMPARISON:  Chest CT 05/24/2015, chest radiograph 05/24/2015  FINDINGS: The heart size at upper limits of normal. Lungs are hypoaerated with crowding of the bronchovascular markings. No focal pulmonary opacity. The visualized skeletal structures are unremarkable.  IMPRESSION: Low volume exam without focal acute finding. If symptoms persist, consider PA and lateral chest radiographs obtained at full inspiration when  the patient is clinically able.   Electronically Signed   By: Conchita Paris M.D.   On: 05/25/2015 16:06    ASSESSMENT/PLAN:  Physical deconditioning - for home health PT, OT, CNA and ST CAP - completed 7 days course of the antibiotic; Resolved Diabetes mellitus, type II uncontrolled - hemoglobin A1c 7.9; continue Lantus 25 units subcutaneous daily and NovoLog sliding scale 3 times a day before meals Acute on chronic kidney disease is stage III - creatinine 1.12; nephrology consultation Chronic diastolic heart failure - stable; continue Demadex 20 mg 2 tabs = 40 mg by mouth daily; weigh on Mondays  and Wednesdays Hypertension - well controlled; continue Demadex 20 mg 2 tabs = 40 mg by mouth daily and carvedilol 12.5 mg by mouth twice a day Atrial flutter - rate controlled; continue carvedilol 12.5 mg by mouth twice a day and eliquis 5 mg 1/2 tab = 2.5 mg by mouth twice a day Hyperthyroidism - TSH 4.04; continue Tapazole 5 mg by mouth 3 times a day OSA - continue CPAP at bedtime Abnormal LFTs - trending down Hypokalemia - K4.1; continue K-Dur 20 meq by mouth daily Hyperlipidemia - statins on hold secondary to abnormal LFTs CAD - stable; on Eliquis, NTG when necessary and carvedilol  Constipation - continue Senna-S 2 tabs PO BID    I have filled out patient's discharge paperwork and written prescriptions.  Patient will receive home health PT, OT, ST and CNA.  DME provided:  Rolling walker  Total discharge time: Greater than 30 minutes  Discharge time involved coordination of the discharge process with Education officer, museum, nursing staff and therapy department. Medical justification for home health services/DME verified.   [facility], NP [employer] [phone]

## 2015-06-16 ENCOUNTER — Telehealth: Payer: Self-pay

## 2015-06-16 NOTE — Telephone Encounter (Signed)
I contacted the pt. Pt is going to continue the 35 units of Humulin N until she sees Dr. Loanne Drilling on 06/26/2015.

## 2015-06-16 NOTE — Telephone Encounter (Signed)
Pt called about her insulin dosage. Pt was recently admitted to the hospital for Pneumonia. During her hospital visit the pt's insulin instructions were adjusted. Pt was given Humalog on a sliding scale and Lantus 25 units. Before going into the Hospital the pt was taking 35 units of humulin N. Pt wanted to know if she can continue with the Humulin N until she comes into see Dr. Loanne Drilling. Please advise during his absence. Thanks!

## 2015-06-16 NOTE — Telephone Encounter (Signed)
Yes

## 2015-06-19 ENCOUNTER — Ambulatory Visit (HOSPITAL_COMMUNITY)
Admission: RE | Admit: 2015-06-19 | Discharge: 2015-06-19 | Disposition: A | Discharge status: Home / Self Care | Payer: Medicare Other | Source: Ambulatory Visit | Attending: Cardiology | Admitting: Cardiology

## 2015-06-19 ENCOUNTER — Encounter (HOSPITAL_COMMUNITY): Payer: Self-pay

## 2015-06-19 VITALS — BP 130/78 | HR 88 | Wt 174.2 lb

## 2015-06-19 DIAGNOSIS — Z794 Long term (current) use of insulin: Secondary | ICD-10-CM | POA: Diagnosis not present

## 2015-06-19 DIAGNOSIS — I480 Paroxysmal atrial fibrillation: Secondary | ICD-10-CM

## 2015-06-19 DIAGNOSIS — G4733 Obstructive sleep apnea (adult) (pediatric): Secondary | ICD-10-CM | POA: Insufficient documentation

## 2015-06-19 DIAGNOSIS — Z7901 Long term (current) use of anticoagulants: Secondary | ICD-10-CM | POA: Insufficient documentation

## 2015-06-19 DIAGNOSIS — Z79899 Other long term (current) drug therapy: Secondary | ICD-10-CM | POA: Diagnosis not present

## 2015-06-19 DIAGNOSIS — I482 Chronic atrial fibrillation: Secondary | ICD-10-CM | POA: Diagnosis not present

## 2015-06-19 DIAGNOSIS — I5032 Chronic diastolic (congestive) heart failure: Secondary | ICD-10-CM | POA: Insufficient documentation

## 2015-06-19 DIAGNOSIS — E663 Overweight: Secondary | ICD-10-CM | POA: Insufficient documentation

## 2015-06-19 DIAGNOSIS — I10 Essential (primary) hypertension: Secondary | ICD-10-CM | POA: Diagnosis not present

## 2015-06-19 DIAGNOSIS — I272 Other secondary pulmonary hypertension: Secondary | ICD-10-CM | POA: Diagnosis not present

## 2015-06-19 LAB — BASIC METABOLIC PANEL
Anion gap: 8 (ref 5–15)
BUN: 15 mg/dL (ref 6–20)
CALCIUM: 9.3 mg/dL (ref 8.9–10.3)
CO2: 26 mmol/L (ref 22–32)
CREATININE: 1.17 mg/dL — AB (ref 0.44–1.00)
Chloride: 103 mmol/L (ref 101–111)
GFR calc Af Amer: 49 mL/min — ABNORMAL LOW (ref >60)
GFR calc non Af Amer: 43 mL/min — ABNORMAL LOW (ref >60)
GLUCOSE: 129 mg/dL — AB (ref 65–99)
Potassium: 4.3 mmol/L (ref 3.5–5.1)
Sodium: 137 mmol/L (ref 135–145)

## 2015-06-19 MED ORDER — INSULIN NPH (HUMAN) (ISOPHANE) 100 UNIT/ML ~~LOC~~ SUSP: 35.0000 [IU] | Freq: Every day | SUBCUTANEOUS | Status: DC

## 2015-06-19 MED ORDER — APIXABAN 5 MG PO TABS: 5.0000 mg | ORAL_TABLET | Freq: Two times a day (BID) | ORAL | Status: DC

## 2015-06-19 MED ORDER — FUROSEMIDE 40 MG PO TABS: 40.0000 mg | ORAL_TABLET | Freq: Every day | ORAL | Status: DC

## 2015-06-19 NOTE — Progress Notes (Addendum)
Patient ID: [name], female   DOB: [birth year], 81 y.o.   MRN: [id number]  Referring Physician: Lovena Le Primary Care: Linna Darner Primary Cardiologist:Taylor  HPI: Mrs. Mucha is a pleasant 81 yo woman with CAD, HTN, atrial fibrillation/ LA\\A flutter and chronic prior systolic heart failure, (which was likely rate related) and diastolic heart failure. She was referred by Dr. Lovena Le for further evaluation of Pulmonary HTN.   Saw Dr. Lovena Le earlier this year. Underwent 2D echo in 3/15 which showed EF 60-65% mild LVH. RV was normal. No significant valvular abnormalities.   Underwent R/L cath 05/07/14 Which showed stable CAD and significant PH  RA 6 RV 71/7 6 PA 63/17 (33)  PCWP 19 mm Hg  LV 174/16 mm Hg  AO 171/62 mean 103 mm Hg  PVR = 2.86 Oxygen saturations:  PA 69%  AO 99%  Cardiac Output/Index (Fick) 4.88/2.7    Left mainstem: Normal.  Left anterior descending (LAD): Moderate calcification proximally. Mild irregularities less than 10%. The first diagonal has 40-50% ostial disease.  Left circumflex (LCx): The LCx gives rise to a large OM1 then terminates in the AV groove. There is 20% disease in the proximal LCx. The first OM stent is patent with diffuse 20% disease.  Right coronary artery (RCA): The RCA arises anteriorly. There is an eccentric slit like stenosis in the proximal vessel to 60-70%. The mid vessel has segmental 70-80% disease.  Left ventriculography: Left ventricular systolic function is normal, LVEF is estimated at 55-65%, there is no significant mitral regurgitation   We saw her for an initial visit in May 2105 for pHTN and DOE. At that time she was quite dyspneic with activity. HR was in 40-50 range on carvedilol 50 bid and digoxin. We stopped her digoxin and cut carvedilol back to 25 bid. PFTs, VQ and CXR were ordered. I also asked her to decrease her H2O intake. We saw her back last month and she was feeling much better. Given results of PFTs overnight oximeter  was placed and referred to Dr. Gwenette Greet. She has had overnight oximetry which showed oxygen desaturation as low as 83%, and spent 97 minutes less than 88% during the night.   Results:  05/21/14: VQ normal 5/15: CXR: 3/16: Echo 3/16 EF 60-65% RV normal. Mild TR No RVSP measured   Emphysema. Otherwise normal.  5/15: PFTs FEV1  1.4 (95%) FVC    1.89 (110%) FEV/FVC 88% FEF 25-75%  66% DLCO 39%  BP rest: 138/58 BP peak: 182/66 Peak VO2: 8.9 (69.1% predicted peak VO2) VE/VCO2 slope: 34.6 OUES: Peak RER: 1.24 Ventilatory Threshold: 5.8 (50% predicted and 65% measured peak VO2) Peak RR 38 Peak Ventilation: 32.7 VE/MVV: 71%  PETCO2 at peak: 36 O2pulse: 8 (100% predicted O2pulse)   Follow-up:  Admitted 5/28-05/31/15 for LL PNA and e.coli. Treated with abx and developed angioedema with levofloxacin. Discharged to [facility] Now back home. On discharge from [facility]. They suggested demadex 40 daily but she has remained on lasix 40 daily. Weight up 1 pound this week. Remains fatigued. Breathing ok. No edema.   Labs  05/23/15  Creatinine 1.2 05/31/15    Creatinine 1.65    Current Outpatient Prescriptions  Medication Sig Dispense Refill  . albuterol (PROVENTIL) (2.5 MG/3ML) 0.083% nebulizer solution Take 3 mLs (2.5 mg total) by nebulization every 4 (four) hours as needed for wheezing or shortness of breath.    Marland Kitchen apixaban (ELIQUIS) 5 MG TABS tablet Take 0.5 tablets (2.5 mg total) by mouth 2 (two) times  daily.    . carvedilol (COREG) 12.5 MG tablet Take 1 tablet (12.5 mg total) by mouth 2 (two) times daily with a meal. 60 tablet 3  . insulin aspart (NOVOLOG) 100 UNIT/ML injection Inject 0-15 Units into the skin 3 (three) times daily with meals. Correction coverage: Moderate (average weight, post-op) CBG < 70: implement hypoglycemia protocol CBG 70 - 120: 0 units CBG 121 - 150: 2 units CBG 151 - 200: 3 units CBG 201 - 250: 5 units CBG 251 - 300: 8 units CBG 301 - 350: 11 units CBG  351 - 400: 15 units CBG > 400: call MD.    . insulin glargine (LANTUS) 100 UNIT/ML injection Inject 0.25 mLs (25 Units total) into the skin daily.    . methimazole (TAPAZOLE) 5 MG tablet Take 5 mg by mouth 3 (three) times a week.     . nitroGLYCERIN (NITROSTAT) 0.4 MG SL tablet Place 0.4 mg under the tongue every 5 (five) minutes as needed for chest pain.    . potassium chloride SA (K-DUR,KLOR-CON) 20 MEQ tablet Take 1 tablet (20 mEq total) by mouth daily.    . Protein (PROCEL 100) POWD Take 1 scoop by mouth 2 (two) times daily. For nutritional support and increase to protein    . sennosides-docusate sodium (SENOKOT-S) 8.6-50 MG tablet Take 1 tablet by mouth 2 (two) times daily.    Marland Kitchen tiotropium (SPIRIVA) 18 MCG inhalation capsule Place 1 capsule (18 mcg total) into inhaler and inhale daily. 90 capsule 3  . OXYGEN Inhale 2 L into the lungs. Check O2 stats every shift    . Torsemide (DEMADEX PO) Take 40 mg by mouth daily.     No current facility-administered medications for this encounter.    Allergies  Allergen Reactions  . Amlodipine Besylate Other (See Comments)    REACTION: tingling in lips & gum edema  . Levaquin [Levofloxacin] Shortness Of Breath and Swelling    angioedema  . Lobster [Shellfish Allergy] Other (See Comments)    angioedema  . Penicillins Rash  . Valsartan Other (See Comments)    REACTION: angioedema  . Codeine Other (See Comments)    Mental status changes  . Tramadol Hcl Nausea And Vomiting   PHYSICAL EXAM: Filed Vitals:   06/19/15 1501  BP: 130/78  Pulse: 88  Weight: 174 lb 4 oz (79.039 kg)  SpO2: 96%    General:  Looks younger than stated age. Well appearing. No respiratory difficulty HEENT: normal Neck: supple. JVP 5-6 Carotids 2+ bilat; no bruits. No lymphadenopathy or thryomegaly appreciated. Cor: PMI nondisplaced.  Irregular rate & rhythm. No rubs, gallops or murmurs. Lungs: clear with diminished BS throughout. No wheeze Abdomen: soft, nontender,  nondistended. No hepatosplenomegaly. No bruits or masses. Good bowel sounds. Extremities: no cyanosis, clubbing, rash, no edema Neuro: alert & oriented x 3, cranial nerves grossly intact. moves all 4 extremities w/o difficulty. Affect pleasant.   ASSESSMENT & PLAN: 1) Mild pulmonary HTN  2) Chronic diastolic HF, NYHA II 3) Overweight 4) Chronic atrial fib/flutter 5) Bradycardia - resolved 6) OSA 7) HTN  Overall doing well despite recent hospitalization. Volume status looks good. Will continue lasix 40 per day for now but will need to watch weight closely. I suspect she will need to go back to 80 daily in the near future.   Will recheck BMET today. If creatinine still over 1.5 will need to decrease Eliquis to 2.5 bid  F/u 2 months,   Quantay Zaremba,MD 3:34 PM

## 2015-06-19 NOTE — Patient Instructions (Addendum)
Routine lab work today. Will notify you of abnormal results, otherwise no news is good news!  Take lasix 40mg  once daily. Rx updated to [facility]  Follow up 2 months.  Do the following things EVERYDAY: 1) Weigh yourself in the morning before breakfast. Write it down and keep it in a log. 2) Take your medicines as prescribed 3) Eat low salt foods-Limit salt (sodium) to 2000 mg per day.  4) Stay as active as you can everyday 5) Limit all fluids for the day to less than 2 liters

## 2015-06-20 ENCOUNTER — Other Ambulatory Visit (INDEPENDENT_AMBULATORY_CARE_PROVIDER_SITE_OTHER): Payer: Medicare Other

## 2015-06-20 ENCOUNTER — Ambulatory Visit (INDEPENDENT_AMBULATORY_CARE_PROVIDER_SITE_OTHER)
Admission: RE | Admit: 2015-06-20 | Discharge: 2015-06-20 | Disposition: A | Discharge status: Home / Self Care | Payer: Medicare Other | Source: Ambulatory Visit | Attending: Internal Medicine | Admitting: Internal Medicine

## 2015-06-20 ENCOUNTER — Encounter: Payer: Self-pay | Admitting: Internal Medicine

## 2015-06-20 ENCOUNTER — Ambulatory Visit (INDEPENDENT_AMBULATORY_CARE_PROVIDER_SITE_OTHER): Payer: Medicare Other | Admitting: Internal Medicine

## 2015-06-20 VITALS — BP 142/78 | HR 83 | Temp 97.6°F | Ht 69.0 in | Wt 174.2 lb

## 2015-06-20 DIAGNOSIS — J189 Pneumonia, unspecified organism: Secondary | ICD-10-CM

## 2015-06-20 DIAGNOSIS — R748 Abnormal levels of other serum enzymes: Secondary | ICD-10-CM

## 2015-06-20 LAB — HEPATIC FUNCTION PANEL
ALBUMIN: 3.6 g/dL (ref 3.5–5.2)
ALT: 22 U/L (ref 0–35)
AST: 25 U/L (ref 0–37)
Alkaline Phosphatase: 98 U/L (ref 39–117)
BILIRUBIN DIRECT: 0 mg/dL (ref 0.0–0.3)
Total Bilirubin: 0.6 mg/dL (ref 0.2–1.2)
Total Protein: 8 g/dL (ref 6.0–8.3)

## 2015-06-20 NOTE — Patient Instructions (Signed)
  Your next office appointment will be determined based upon review of your pending   xrays  Those written interpretation of the  results and instructions will be transmitted to you by mail for your records.  Critical results will be called.   Followup as needed for any active or acute issue. Please report any significant change in your symptoms.

## 2015-06-20 NOTE — Progress Notes (Signed)
Pre visit review using our clinic review tool, if applicable. No additional management support is needed unless otherwise documented below in the visit note. 

## 2015-06-20 NOTE — Progress Notes (Signed)
   Subjective:    Patient ID: [name], female    DOB: Jan 20, 1931, 81 y.o.   MRN: [id number]  HPI  She was hospitalized 5/28-05/31/15 for left lower lobe pneumonia and Escherichia coli urinary tract infection. She had presented in a septic state. Her hospital course was complicated by angioedema with levofloxacin. She was changed to clindamycin.  She also had renal insufficiency as well as dramatic elevation of liver function tests.  When she originally presented 05/23/15 she had had severe diffuse muscle pain. Her CK at that time was 779. The Crestor was discontinued. She also had an elevated white count of over 14,000 without localizing signs or symptoms.  Her CK now is 90. Creatinine is 1.17 with GFR of 49. On 6/2 her AST had been 156 and ALT 129. She does ingest Tylenol.  She was discharged to rehabilitation where she stayed 6/4-6/17.   Review of Systems  She saw her Cardiologist 6/23. She's now on furosemide 40 mg daily. She's only gained 1 pound in the past week.  She remains fatigued but her strength and endurance have improved with rehabilitation. She is doing some exercises at home which she got on line. [facility]    Objective:   Physical Exam Pertinent or positive findings include: She appears dramatically younger than her stated age. Heart rhythm is irregular. She has stria over the lower back. Crepitus is present in the knees. Pedal pulses are slightly decreased but palpable.  General appearance :adequately nourished; in no distress.  Eyes: No conjunctival inflammation or scleral icterus is present.  Oral exam:  Lips and gums are healthy appearing.There is no oropharyngeal erythema or exudate noted. Dental hygiene is good.  Heart:  No gallop, murmur, click, rub or other extra sounds    Lungs:Chest clear to auscultation; no wheezes, rhonchi,rales ,or rubs present.No increased work of breathing.   Abdomen: bowel sounds normal, soft and  non-tender without masses, organomegaly or hernias noted.  No guarding or rebound. No HJR.  Vascular : all pulses equal ; no bruits present.  Skin:Warm & dry.  Intact without suspicious lesions or rashes ; no tenting    Lymphatic: No lymphadenopathy is noted about the head, neck, axilla.   Neuro: Strength, tone & DTRs normal.         Assessment & Plan:  #1CAP; follow up film needed   #2 elevated liver enzymes; hepatic panel requested  #3 dramatic CK elevation , resolved.  Plan: See orders and after visit summary.

## 2015-06-26 ENCOUNTER — Ambulatory Visit (INDEPENDENT_AMBULATORY_CARE_PROVIDER_SITE_OTHER): Payer: Medicare Other | Admitting: Endocrinology

## 2015-06-26 ENCOUNTER — Encounter: Payer: Self-pay | Admitting: Endocrinology

## 2015-06-26 VITALS — BP 132/80 | HR 97 | Temp 97.8°F | Wt 175.0 lb

## 2015-06-26 DIAGNOSIS — IMO0002 Reserved for concepts with insufficient information to code with codable children: Secondary | ICD-10-CM

## 2015-06-26 DIAGNOSIS — E039 Hypothyroidism, unspecified: Secondary | ICD-10-CM

## 2015-06-26 DIAGNOSIS — E1165 Type 2 diabetes mellitus with hyperglycemia: Secondary | ICD-10-CM

## 2015-06-26 DIAGNOSIS — Z794 Long term (current) use of insulin: Principal | ICD-10-CM

## 2015-06-26 MED ORDER — INSULIN NPH (HUMAN) (ISOPHANE) 100 UNIT/ML ~~LOC~~ SUSP: 30.0000 [IU] | Freq: Every day | SUBCUTANEOUS | Status: DC

## 2015-06-26 NOTE — Patient Instructions (Addendum)
check your blood sugar twice a day.  vary the time of day when you check, between before the 3 meals, and at bedtime.  also check if you have symptoms of your blood sugar being too high or too low.  please keep a record of the readings and bring it to your next appointment here.  please call us sooner if your blood sugar goes below 70, or if you have a lot of readings over 200.   On this type of insulin schedule, you should eat meals on a regular schedule.  If a meal is missed or significantly delayed, your blood sugar could go low.  Please reduce the insulin to 30 units each morning.   Please come back for a follow-up appointment in 2 months.     Please continue your dietary efforts, as this helps your blood sugar.   Please continue the same methimazole.

## 2015-06-26 NOTE — Progress Notes (Signed)
Subjective:    Patient ID: [name], female    DOB: 1931-05-19, 81 y.o.   MRN: [id number]  HPI Pt returns for f/u of diabetes mellitus: DM type: Insulin-requiring type 2 Dx'ed: 5009 Complications: CAD Therapy: insulin since 2007. GDM: never DKA: never Severe hypoglycemia: never Pancreatitis: never. Other: she changed to qd insulin, after poor results with multiple daily injections. Interval history: she has continued to lose weight, due to her recent illness.  she brings a record of her cbg's which i have reviewed today.  It varies from 61-200's.  There is no trend throughout the day. Past Medical History  Diagnosis Date  . Hypopotassemia     PMH of  . Anemia     iron defficiency  . Muscle pain   . Atrial fibrillation or flutter     s/p RFCA 7/08;   s/p DCCV in past;   previously on amiodarone;  amio stopped due to lung toxicity  . CAD (coronary artery disease)     s/p NSTEMI tx with BMS to OM1 3/08;  cath 3/08: pOM 99% tx with PCI, pLAD 20%, ? mod stenosis at the AM  . HLD (hyperlipidemia)   . HTN (hypertension)     essential nos  . Dystrophy, corneal stromal   . Chronic diastolic heart failure     echo 11/11:  EF 55-60%, severe LVH, mod LAE, mild MR, mildly increased PASP  . Osteoporosis   . Heart murmur   . Anginal pain   . Myocardial infarction 2008  . COPD (chronic obstructive pulmonary disease)   . Asthma   . Pneumonia 05/27/2015  . OSA on CPAP   . Type II diabetes mellitus     Dr Loanne Drilling  . Seasonal allergies   . Degenerative joint disease   . Arthritis     "knees, feet, hands; joints" (05/27/2015)    Past Surgical History  Procedure Laterality Date  . Cholecystectomy    . Knee cartilage surgery Right   . Eye surgery    . A flutter ablation      Dr Lovena Le  . Gravid 2 para 2    . Colonoscopy  6/07    2 polyps Dr. Kinnie Feil in [place]  . Coronary angioplasty with stent placement  02/2007    BMS w/Dr Lovena Le  . Colonoscopy with polypectomy       X 2;  [facility]  . Left and right heart catheterization with coronary angiogram N/A 05/07/2014    Procedure: LEFT AND RIGHT HEART CATHETERIZATION WITH CORONARY ANGIOGRAM;  Surgeon: Peter M Martinique, MD;  Location: [facility] CATH LAB;  Service: Cardiovascular;  Laterality: N/A;  . Cataract extraction w/ intraocular lens  implant, bilateral Bilateral   . Corneal transplant Bilateral     History   Social History  . Marital Status: Widowed    Spouse Name: N/A  . Number of Children: N/A  . Years of Education: N/A   Occupational History  . Teacher    Social History Main Topics  . Smoking status: Former Smoker -- 0.25 packs/day for 18 years    Types: Cigarettes    Quit date: 12/27/1968  . Smokeless tobacco: Never Used     Comment: smoked [place], up to 1 pp week  . Alcohol Use: No  . Drug Use: No  . Sexual Activity: Yes   Other Topics Concern  . Not on file   Social History Narrative   Teacher. Widowed. Rarely drinks cafeine.  Current Outpatient Prescriptions on File Prior to Visit  Medication Sig Dispense Refill  . apixaban (ELIQUIS) 5 MG TABS tablet Take 1 tablet (5 mg total) by mouth 2 (two) times daily. 60 tablet 3  . carvedilol (COREG) 12.5 MG tablet Take 1 tablet (12.5 mg total) by mouth 2 (two) times daily with a meal. 60 tablet 3  . furosemide (LASIX) 40 MG tablet Take 1 tablet (40 mg total) by mouth daily. 90 tablet 3  . methimazole (TAPAZOLE) 5 MG tablet Take 5 mg by mouth 3 (three) times a week.     . nitroGLYCERIN (NITROSTAT) 0.4 MG SL tablet Place 0.4 mg under the tongue every 5 (five) minutes as needed for chest pain.    . potassium chloride SA (K-DUR,KLOR-CON) 20 MEQ tablet Take 1 tablet (20 mEq total) by mouth daily.    Marland Kitchen tiotropium (SPIRIVA) 18 MCG inhalation capsule Place 1 capsule (18 mcg total) into inhaler and inhale daily. 90 capsule 3   No current facility-administered medications on file prior to visit.    Allergies  Allergen Reactions  . Amlodipine Besylate  Other (See Comments)    REACTION: tingling in lips & gum edema  . Levaquin [Levofloxacin] Shortness Of Breath and Swelling    angioedema  . Lobster [Shellfish Allergy] Other (See Comments)    angioedema  . Penicillins Rash  . Valsartan Other (See Comments)    REACTION: angioedema  . Codeine Other (See Comments)    Mental status changes  . Tramadol Hcl Nausea And Vomiting    Family History  Problem Relation Age of Onset  . Diabetes Mother   . Hypertension Mother   . Transient ischemic attack Mother   . Heart attack Father 29  . Arthritis Father   . Breast cancer Maternal Aunt   . Arthritis Maternal Aunt   . Arthritis Mother   . Hypertension Father     BP 132/80 mmHg  Pulse 97  Temp(Src) 97.8 F (36.6 C) (Oral)  Wt 175 lb (79.379 kg)  SpO2 91%  Review of Systems Denies LOC, fever, and n/v    Objective:   Physical Exam VITAL SIGNS:  See vs page GENERAL: no distress Pulses: dorsalis pedis intact bilat.   MSK: no deformity of the feet CV: no leg edema Skin:  no ulcer on the feet.  normal color and temp on the feet. Neuro: sensation is intact to touch on the feet.  Lab Results  Component Value Date   HGBA1C 7.9* 05/24/2015   Lab Results  Component Value Date   TSH 4.04 04/28/2015      Assessment & Plan:  Hyperthyroidism: well-controlled.   DM: overcontrolled, given this regimen, which does match insulin to her changing needs throughout the day. Weight loss: due to illness, but she is advised to continue.    Patient is advised the following: Patient Instructions  check your blood sugar twice a day.  vary the time of day when you check, between before the 3 meals, and at bedtime.  also check if you have symptoms of your blood sugar being too high or too low.  please keep a record of the readings and bring it to your next appointment here.  please call us sooner if your blood sugar goes below 70, or if you have a lot of readings over 200.   On this type of  insulin schedule, you should eat meals on a regular schedule.  If a meal is missed or significantly delayed, your blood sugar  could go low.  Please reduce the insulin to 30 units each morning.   Please come back for a follow-up appointment in 2 months.     Please continue your dietary efforts, as this helps your blood sugar.   Please continue the same methimazole.

## 2015-07-18 ENCOUNTER — Telehealth: Payer: Self-pay | Admitting: Internal Medicine

## 2015-07-18 ENCOUNTER — Telehealth (HOSPITAL_COMMUNITY): Payer: Self-pay | Admitting: *Deleted

## 2015-07-18 NOTE — Telephone Encounter (Signed)
Patient is wondering if you would have any samples of Spiriva, because they are so expensive.  Her daughter [name] has an appt with you at 4pm today and was hoping if you did, to give them to her

## 2015-07-18 NOTE — Telephone Encounter (Signed)
Pt said she is in the "doughnut hole" with her insurance and requested samples of eliquis. Samples at front desk with [facility].

## 2015-07-21 NOTE — Telephone Encounter (Signed)
I am very sorry but Spirva samples not available

## 2015-07-21 NOTE — Telephone Encounter (Signed)
Do we have samples ??

## 2015-07-21 NOTE — Telephone Encounter (Signed)
LVM for pt to call back as soon as possible.   

## 2015-07-22 ENCOUNTER — Telehealth: Payer: Self-pay | Admitting: Internal Medicine

## 2015-07-22 NOTE — Telephone Encounter (Signed)
Please advise 

## 2015-07-22 NOTE — Telephone Encounter (Signed)
We don't @ [facility]; Dr Cordelia Pen office may or he may change brand

## 2015-07-22 NOTE — Telephone Encounter (Signed)
Patient stated that medication Humulin N is to expensive, do you have any samples, or discount card. Please advise

## 2015-07-23 NOTE — Telephone Encounter (Signed)
Spoke with pt to inform her of [facility]'s note. Pt stated that Dr Loanne Drilling said she could buy a different brand until her assistance program went into effect.

## 2015-07-24 ENCOUNTER — Telehealth (HOSPITAL_COMMUNITY): Payer: Self-pay | Admitting: Cardiology

## 2015-07-24 NOTE — Telephone Encounter (Signed)
Pt called to request signature for medication assistance program for eliquis Advised pt we will be more than happy to complete application and fax forms for her Pt voiced appreciation and will drop forms off at her convenience

## 2015-07-29 ENCOUNTER — Encounter: Payer: Self-pay | Admitting: Gastroenterology

## 2015-07-29 ENCOUNTER — Other Ambulatory Visit: Payer: Self-pay

## 2015-07-29 MED ORDER — INSULIN NPH (HUMAN) (ISOPHANE) 100 UNIT/ML ~~LOC~~ SUSP: 30.0000 [IU] | Freq: Every day | SUBCUTANEOUS | Status: DC

## 2015-07-30 ENCOUNTER — Telehealth: Payer: Self-pay | Admitting: Internal Medicine

## 2015-07-30 NOTE — Telephone Encounter (Signed)
New message      Pt is in donut hole with eliquis. Want nurse to know she has completed paperwork with [employer]rystol [employer]ers [employer] for assistance.  They may contact you

## 2015-07-31 NOTE — Telephone Encounter (Signed)
Eliquis 5 mg 2 boxes of samples provided to patient.

## 2015-07-31 NOTE — Telephone Encounter (Signed)
Patient states Dr. Jeffie Pollock has the other part of the PA forms. Will check to see if we have Eliquis samples.

## 2015-08-04 ENCOUNTER — Telehealth: Payer: Self-pay | Admitting: Emergency Medicine

## 2015-08-04 NOTE — Telephone Encounter (Signed)
Need options on her Formulary for Spirva (Ex Anoro,etc)

## 2015-08-04 NOTE — Telephone Encounter (Signed)
Pt was in office today with her daughter, Pt asked that I send a message to Dr Linna Darner about which medication she could switch to other than Spiriva. Pt stated she could no longer afford Spiriva. Please advise

## 2015-08-05 ENCOUNTER — Telehealth (HOSPITAL_COMMUNITY): Payer: Self-pay | Admitting: *Deleted

## 2015-08-05 NOTE — Telephone Encounter (Signed)
Pt applied to the [employer] pt asst program and was approved to received Eliquis free from them until 12/27/15

## 2015-08-05 NOTE — Telephone Encounter (Signed)
Pt left msg on triage requesting to speak with [name] concerning msg below...[name]

## 2015-08-06 ENCOUNTER — Telehealth: Payer: Self-pay | Admitting: Endocrinology

## 2015-08-06 NOTE — Telephone Encounter (Signed)
[facility] note dated 08/05/15 at 5:13 PM: Caller states she needs to give [name] a message. The caller is in the gap and Humalin was over 100.00 dollars. The caller got a letter that she did not qualify for extra help on scripts at this time. The Novalin that she got from the physician is working well. Can the caller continue to use Novalin and the caller does not need a pen. Couod Megan call the caller back?

## 2015-08-06 NOTE — Telephone Encounter (Signed)
Pt called back. I informed her of the information below and she would like to know what her options are.  She has some form to be filled out from a program that helps people get their medication for free once they are in the gap.  She will be coming in tomorrow with her daughter who sees another provider and bring that in. She still would like a call to see what options she has till she can get those meds.

## 2015-08-06 NOTE — Telephone Encounter (Signed)
Spoke with pharm, they stated that there is not another option for Spriva other than Combivent, which is just as expensive if not more. LVM for pt to call back to receive information.

## 2015-08-07 NOTE — Telephone Encounter (Signed)
I contacted the pt and left a voicemail stating we had received feed back from the [employer]. Pt was advised [employer] needs documentation showing the insulin cost her 1200 out of pocket. I requested a call back from the pt to discuss.

## 2015-08-07 NOTE — Telephone Encounter (Signed)
Spoke with pts son to inform him on the answers we had for the pt. He stated that she would be back in the office today with her daughter for an appt and would talk with [place] then.

## 2015-08-12 NOTE — Telephone Encounter (Signed)
[facility] on [address]?

## 2015-08-12 NOTE — Telephone Encounter (Signed)
Patient used copay card for anoro. She states that the pharmacy needs a RX, however, to make use of it. Can we send a script to

## 2015-08-13 ENCOUNTER — Other Ambulatory Visit: Payer: Self-pay | Admitting: Emergency Medicine

## 2015-08-13 NOTE — Telephone Encounter (Signed)
Patient called to follow up on this. Please follow up as soon as possible.

## 2015-08-14 ENCOUNTER — Other Ambulatory Visit: Payer: Self-pay | Admitting: Emergency Medicine

## 2015-08-14 MED ORDER — UMECLIDINIUM-VILANTEROL 62.5-25 MCG/INH IN AEPB: 1.0000 | INHALATION_SPRAY | Freq: Every day | RESPIRATORY_TRACT | Status: DC

## 2015-08-14 NOTE — Telephone Encounter (Signed)
RX for Anoro [facility] [place] on [address]. Spoke with pt yesterday, informed her that I will send in her drug assistance paperwork when Linus Orn returns on Monday.

## 2015-08-19 ENCOUNTER — Ambulatory Visit (INDEPENDENT_AMBULATORY_CARE_PROVIDER_SITE_OTHER)
Admission: RE | Admit: 2015-08-19 | Discharge: 2015-08-19 | Disposition: A | Discharge status: Home / Self Care | Payer: Medicare Other | Source: Ambulatory Visit | Attending: Internal Medicine | Admitting: Internal Medicine

## 2015-08-19 ENCOUNTER — Encounter: Payer: Self-pay | Admitting: Internal Medicine

## 2015-08-19 ENCOUNTER — Ambulatory Visit (INDEPENDENT_AMBULATORY_CARE_PROVIDER_SITE_OTHER): Payer: Medicare Other | Admitting: Internal Medicine

## 2015-08-19 ENCOUNTER — Other Ambulatory Visit (INDEPENDENT_AMBULATORY_CARE_PROVIDER_SITE_OTHER): Payer: Medicare Other

## 2015-08-19 VITALS — BP 148/82 | HR 87 | Temp 97.7°F | Resp 18 | Wt 170.0 lb

## 2015-08-19 DIAGNOSIS — R195 Other fecal abnormalities: Secondary | ICD-10-CM

## 2015-08-19 DIAGNOSIS — R350 Frequency of micturition: Secondary | ICD-10-CM

## 2015-08-19 DIAGNOSIS — R509 Fever, unspecified: Secondary | ICD-10-CM | POA: Diagnosis not present

## 2015-08-19 DIAGNOSIS — R0600 Dyspnea, unspecified: Secondary | ICD-10-CM

## 2015-08-19 LAB — HEPATIC FUNCTION PANEL
ALT: 21 U/L (ref 0–35)
AST: 31 U/L (ref 0–37)
Albumin: 3.7 g/dL (ref 3.5–5.2)
Alkaline Phosphatase: 93 U/L (ref 39–117)
BILIRUBIN TOTAL: 0.7 mg/dL (ref 0.2–1.2)
Bilirubin, Direct: 0.2 mg/dL (ref 0.0–0.3)
Total Protein: 8 g/dL (ref 6.0–8.3)

## 2015-08-19 LAB — CBC WITH DIFFERENTIAL/PLATELET
BASOS PCT: 0.2 % (ref 0.0–3.0)
Basophils Absolute: 0 10*3/uL (ref 0.0–0.1)
EOS ABS: 0.3 10*3/uL (ref 0.0–0.7)
Eosinophils Relative: 3.2 % (ref 0.0–5.0)
HCT: 42.1 % (ref 36.0–46.0)
Hemoglobin: 13.7 g/dL (ref 12.0–15.0)
Lymphocytes Relative: 8.4 % — ABNORMAL LOW (ref 12.0–46.0)
Lymphs Abs: 0.8 10*3/uL (ref 0.7–4.0)
MCHC: 32.6 g/dL (ref 30.0–36.0)
MCV: 89.8 fl (ref 78.0–100.0)
MONO ABS: 1 10*3/uL (ref 0.1–1.0)
Monocytes Relative: 11.1 % (ref 3.0–12.0)
Neutro Abs: 7 10*3/uL (ref 1.4–7.7)
Neutrophils Relative %: 77.1 % — ABNORMAL HIGH (ref 43.0–77.0)
Platelets: 195 10*3/uL (ref 150.0–400.0)
RBC: 4.69 Mil/uL (ref 3.87–5.11)
RDW: 13.7 % (ref 11.5–15.5)
WBC: 9.1 10*3/uL (ref 4.0–10.5)

## 2015-08-19 LAB — BASIC METABOLIC PANEL
BUN: 23 mg/dL (ref 6–23)
CO2: 31 mEq/L (ref 19–32)
CREATININE: 1.16 mg/dL (ref 0.40–1.20)
Calcium: 9.9 mg/dL (ref 8.4–10.5)
Chloride: 94 mEq/L — ABNORMAL LOW (ref 96–112)
GFR: 57.55 mL/min — AB (ref >60.00)
GLUCOSE: 288 mg/dL — AB (ref 70–99)
Potassium: 4.1 mEq/L (ref 3.5–5.1)
Sodium: 133 mEq/L — ABNORMAL LOW (ref 135–145)

## 2015-08-19 LAB — URINALYSIS, ROUTINE W REFLEX MICROSCOPIC
Bilirubin Urine: NEGATIVE
Ketones, ur: NEGATIVE
Nitrite: POSITIVE — AB
PH: 5.5 (ref 5.0–8.0)
SPECIFIC GRAVITY, URINE: 1.015 (ref 1.000–1.030)
URINE GLUCOSE: 100 — AB
UROBILINOGEN UA: 0.2 (ref 0.0–1.0)

## 2015-08-19 LAB — BRAIN NATRIURETIC PEPTIDE: Pro B Natriuretic peptide (BNP): 143 pg/mL — ABNORMAL HIGH (ref 0.0–100.0)

## 2015-08-19 MED ORDER — SULFAMETHOXAZOLE-TRIMETHOPRIM 800-160 MG PO TABS: 1.0000 | ORAL_TABLET | Freq: Two times a day (BID) | ORAL | Status: DC

## 2015-08-19 NOTE — Progress Notes (Signed)
Pre visit review using our clinic review tool, if applicable. No additional management support is needed unless otherwise documented below in the visit note. 

## 2015-08-19 NOTE — Patient Instructions (Signed)
  Your next office appointment will be determined based upon review of your pending labs  and  xrays  Those written interpretation of the lab results and instructions will be transmitted to you by mail for your records.  Critical results will be called.   Followup as needed for any active or acute issue. Please report any significant change in your symptoms. 

## 2015-08-19 NOTE — Progress Notes (Signed)
   Subjective:    Patient ID: [name], female    DOB: [birth year], 81 y.o.   MRN: [id number]  HPI  Her symptoms began 08/17/15 is frequency every 2-3 hours during the day. She also had nocturia 2 times. She subsequently developed anorexia and generalized malaise. She felt chilled 8/21 & 8/22. Her son felt she had fever as her skin was warm. She had an isolated abdominal cramps. The stool was loose and markedly hypopigmented 8/23. She describes it as "almost white". She developed small red papules over the lower extremities 8/21. She's been profoundly short of breath w/o associated wheezing or PND .  Review of Systems  Frontal headache, facial pain , nasal purulence, dental pain, sore throat , otic pain or otic discharge denied.  Cough, sputum production, hemoptysis, or pleuritic pain denied.  No diarrhea present.Cola colored urine  denied.  Dysuria, pyuria, or hematuria not present.  No vaginal discharge or bleeding noted.  No pustules, vesicles.  No redness or swelling of joints; but she has chronic pain in the right knee.  No significant travel, pets, or tick exposures.      Objective:   Physical Exam  Pertinent or positive findings include: She appears fatigued but not in acute distress. She has an upper partial. Dry rales are noted at the bases. Heart rhythm is irregular. No murmur is auscultated. She has mixed DIP/PIP arthritic changes. Ecchymosis of the right index fingernail noted distally. There are no hemorrhages in the conjunctiva or the nail bases.Multiple faintly erythematous tiny papular lesions scattered over shins.  General appearance :adequately nourished; in no distress.  Eyes: No conjunctival inflammation or scleral icterus is present.  Oral exam:  Lips and gums are healthy appearing.There is no oropharyngeal erythema or exudate noted. Dental hygiene is good.  Heart:  S1 and S2 normal without gallop, murmur, click, rub or other extra sounds    Lungs:No  wheezes ,rhonchi ,or rubs present.No increased work of breathing.   Abdomen: bowel sounds normal, soft and non-tender without masses, organomegaly or hernias noted.  No guarding or rebound. No flank tenderness to percussion.  Vascular : all pulses equal ; no bruits present.  Skin:Warm & dry; no tenting    Lymphatic: No lymphadenopathy is noted about the head, neck, axilla.   Neuro: Strength, tone decreased.         Assessment & Plan:  #1 Urinary frequency  #2 fever  #3 dyspnea  #4 papular dermatitis  #5 change in stools (loose, hypopigmented)  See orders and recommendations

## 2015-08-20 ENCOUNTER — Telehealth (HOSPITAL_COMMUNITY): Payer: Self-pay | Admitting: *Deleted

## 2015-08-20 MED ORDER — FUROSEMIDE 40 MG PO TABS
80.0000 mg | ORAL_TABLET | Freq: Every day | ORAL | Status: DC
Start: 2015-08-20 — End: 2016-09-15

## 2015-08-20 NOTE — Telephone Encounter (Signed)
Pt called, she states she went to get her Lasix refilled was told it was to early they couldn't fill it.  She states she had tried to take just 40 mg daily but was told by Dr Haroldine Laws at last OV if wt was going up to increase to 80 mg daily which is what she had to do.  Last OV note states:  Will continue lasix 40 per day for now but will need to watch weight closely. I suspect she will need to go back to 80 daily in the near future.   Will recheck BMET today. If creatinine still over 1.5 will need to decrease Eliquis to 2.5 bid  F/u 2 months,   Bensimhon, Daniel,MD 3:34 PM  Therefore new rx for 80 mg sent to pharmacy

## 2015-08-21 LAB — CULTURE, URINE COMPREHENSIVE

## 2015-08-26 ENCOUNTER — Encounter: Payer: Self-pay | Admitting: Endocrinology

## 2015-08-26 ENCOUNTER — Ambulatory Visit (INDEPENDENT_AMBULATORY_CARE_PROVIDER_SITE_OTHER): Payer: Medicare Other | Admitting: Endocrinology

## 2015-08-26 VITALS — BP 132/70 | HR 86 | Temp 97.2°F | Ht 63.0 in | Wt 171.0 lb

## 2015-08-26 DIAGNOSIS — E042 Nontoxic multinodular goiter: Secondary | ICD-10-CM

## 2015-08-26 DIAGNOSIS — Z794 Long term (current) use of insulin: Principal | ICD-10-CM

## 2015-08-26 DIAGNOSIS — IMO0002 Reserved for concepts with insufficient information to code with codable children: Secondary | ICD-10-CM

## 2015-08-26 DIAGNOSIS — E1165 Type 2 diabetes mellitus with hyperglycemia: Secondary | ICD-10-CM

## 2015-08-26 LAB — POCT GLYCOSYLATED HEMOGLOBIN (HGB A1C): Hemoglobin A1C: 8.3

## 2015-08-26 LAB — TSH: TSH: 2.66 u[IU]/mL (ref 0.35–4.50)

## 2015-08-26 MED ORDER — INSULIN NPH (HUMAN) (ISOPHANE) 100 UNIT/ML ~~LOC~~ SUSP: 35.0000 [IU] | Freq: Every day | SUBCUTANEOUS | Status: DC

## 2015-08-26 NOTE — Progress Notes (Signed)
Subjective:    Patient ID: [name], female    DOB: 21-Aug-1931, 81 y.o.   MRN: [id number]  HPI Pt returns for f/u of diabetes mellitus: DM type: Insulin-requiring type 2 Dx'ed: 6948 Complications: CAD Therapy: insulin since 2007. GDM: never DKA: never Severe hypoglycemia: never Pancreatitis: never. Other: she changed to qd insulin, after poor results with multiple daily injections.   Interval history: she brings a record of her cbg's which i have reviewed today.  It varies from 100-200's. There is no trend throughout the day.   Past Medical History  Diagnosis Date  . Hypopotassemia     PMH of  . Anemia     iron defficiency  . Muscle pain   . Atrial fibrillation or flutter     s/p RFCA 7/08;   s/p DCCV in past;   previously on amiodarone;  amio stopped due to lung toxicity  . CAD (coronary artery disease)     s/p NSTEMI tx with BMS to OM1 3/08;  cath 3/08: pOM 99% tx with PCI, pLAD 20%, ? mod stenosis at the AM  . HLD (hyperlipidemia)   . HTN (hypertension)     essential nos  . Dystrophy, corneal stromal   . Chronic diastolic heart failure     echo 11/11:  EF 55-60%, severe LVH, mod LAE, mild MR, mildly increased PASP  . Osteoporosis   . Heart murmur   . Anginal pain   . Myocardial infarction 2008  . COPD (chronic obstructive pulmonary disease)   . Asthma   . Pneumonia 05/27/2015  . OSA on CPAP   . Type II diabetes mellitus     Dr Loanne Drilling  . Seasonal allergies   . Degenerative joint disease   . Arthritis     "knees, feet, hands; joints" (05/27/2015)    Past Surgical History  Procedure Laterality Date  . Cholecystectomy    . Knee cartilage surgery Right   . Eye surgery    . A flutter ablation      Dr Lovena Le  . Gravid 2 para 2    . Colonoscopy  6/07    2 polyps Dr. Kinnie Feil in [place]  . Coronary angioplasty with stent placement  02/2007    BMS w/Dr Lovena Le  . Colonoscopy with polypectomy       X 2; [facility]  . Left and right heart catheterization with  coronary angiogram N/A 05/07/2014    Procedure: LEFT AND RIGHT HEART CATHETERIZATION WITH CORONARY ANGIOGRAM;  Surgeon: Peter M Martinique, MD;  Location: [facility] CATH LAB;  Service: Cardiovascular;  Laterality: N/A;  . Cataract extraction w/ intraocular lens  implant, bilateral Bilateral   . Corneal transplant Bilateral     Social History   Social History  . Marital Status: Widowed    Spouse Name: N/A  . Number of Children: N/A  . Years of Education: N/A   Occupational History  . Teacher    Social History Main Topics  . Smoking status: Former Smoker -- 0.25 packs/day for 18 years    Types: Cigarettes    Quit date: 12/27/1968  . Smokeless tobacco: Never Used     Comment: smoked [place], up to 1 pp week  . Alcohol Use: No  . Drug Use: No  . Sexual Activity: Yes   Other Topics Concern  . Not on file   Social History Narrative   Teacher. Widowed. Rarely drinks cafeine.     Current Outpatient Prescriptions on File Prior to Visit  Medication Sig Dispense Refill  . apixaban (ELIQUIS) 5 MG TABS tablet Take 1 tablet (5 mg total) by mouth 2 (two) times daily. 60 tablet 3  . carvedilol (COREG) 12.5 MG tablet Take 1 tablet (12.5 mg total) by mouth 2 (two) times daily with a meal. 60 tablet 3  . furosemide (LASIX) 40 MG tablet Take 2 tablets (80 mg total) by mouth daily. 180 tablet 3  . methimazole (TAPAZOLE) 5 MG tablet Take 5 mg by mouth 3 (three) times a week.     . nitroGLYCERIN (NITROSTAT) 0.4 MG SL tablet Place 0.4 mg under the tongue every 5 (five) minutes as needed for chest pain.    . potassium chloride SA (K-DUR,KLOR-CON) 20 MEQ tablet Take 1 tablet (20 mEq total) by mouth daily.    Marland Kitchen sulfamethoxazole-trimethoprim (BACTRIM DS,SEPTRA DS) 800-160 MG per tablet Take 1 tablet by mouth 2 (two) times daily. 14 tablet 0  . tiotropium (SPIRIVA) 18 MCG inhalation capsule Place 1 capsule (18 mcg total) into inhaler and inhale daily. 90 capsule 3  . Umeclidinium-Vilanterol 62.5-25 MCG/INH  AEPB Inhale 1 puff into the lungs daily. 1 each 5   No current facility-administered medications on file prior to visit.    Allergies  Allergen Reactions  . Amlodipine Besylate Other (See Comments)    REACTION: tingling in lips & gum edema  . Levaquin [Levofloxacin] Shortness Of Breath and Swelling    angioedema  . Lobster [Shellfish Allergy] Other (See Comments)    angioedema  . Penicillins Rash  . Valsartan Other (See Comments)    REACTION: angioedema  . Codeine Other (See Comments)    Mental status changes  . Tramadol Hcl Nausea And Vomiting    Family History  Problem Relation Age of Onset  . Diabetes Mother   . Hypertension Mother   . Transient ischemic attack Mother   . Heart attack Father 45  . Arthritis Father   . Breast cancer Maternal Aunt   . Arthritis Maternal Aunt   . Arthritis Mother   . Hypertension Father     BP 132/70 mmHg  Pulse 86  Temp(Src) 97.2 F (36.2 C) (Oral)  Ht 5\' 3"  (1.6 m)  Wt 171 lb (77.565 kg)  BMI 30.30 kg/m2  SpO2 94%  Review of Systems She has lost a few more lbs.      Objective:   Physical Exam VITAL SIGNS:  See vs page GENERAL: no distress Pulses: dorsalis pedis intact bilat.   MSK: no deformity of the feet CV: no leg edema Skin:  no ulcer on the feet.  normal color and temp on the feet. Neuro: sensation is intact to touch on the feet   A1c=8.3% Lab Results  Component Value Date   TSH 2.66 08/26/2015      Assessment & Plan:  DM: glycemic control is worse.   Hyperthyroidism: well-controlled.  Please continue the same tapazole.   Patient is advised the following: Patient Instructions  check your blood sugar twice a day.  vary the time of day when you check, between before the 3 meals, and at bedtime.  also check if you have symptoms of your blood sugar being too high or too low.  please keep a record of the readings and bring it to your next appointment here.  please call us sooner if your blood sugar goes below 70,  or if you have a lot of readings over 200.   A thyroid blood test is requested for you today.  We'll  let you know about the results.  On this type of insulin schedule, you should eat meals on a regular schedule.  If a meal is missed or significantly delayed, your blood sugar could go low.  Please increase the insulin back up to 35 units each morning.   Please come back for a follow-up appointment in 3 months.

## 2015-08-26 NOTE — Patient Instructions (Addendum)
check your blood sugar twice a day.  vary the time of day when you check, between before the 3 meals, and at bedtime.  also check if you have symptoms of your blood sugar being too high or too low.  please keep a record of the readings and bring it to your next appointment here.  please call us sooner if your blood sugar goes below 70, or if you have a lot of readings over 200.   A thyroid blood test is requested for you today.  We'll let you know about the results.  On this type of insulin schedule, you should eat meals on a regular schedule.  If a meal is missed or significantly delayed, your blood sugar could go low.  Please increase the insulin back up to 35 units each morning.   Please come back for a follow-up appointment in 3 months.

## 2015-08-29 ENCOUNTER — Telehealth: Payer: Self-pay | Admitting: Internal Medicine

## 2015-08-29 NOTE — Telephone Encounter (Signed)
Patient is inquiring on several specifics with 08/23 culture. She requests that you give her a call.

## 2015-08-29 NOTE — Telephone Encounter (Signed)
Spoke with pt to clarify questions about UA.

## 2015-09-15 ENCOUNTER — Telehealth: Payer: Self-pay | Admitting: Internal Medicine

## 2015-09-15 NOTE — Telephone Encounter (Signed)
Please print out Rx to send

## 2015-09-15 NOTE — Telephone Encounter (Signed)
Pt states that she applied for an assistant program and you were very nice and sent the paperwork in for her. She has been denied and they said she can send the paperwork back to appeal the decision. She is hoping Linus Orn can write a prescription for spuriva to send with the paperwork. Can you please call her at [phone] regarding this.

## 2015-09-15 NOTE — Telephone Encounter (Signed)
Please advise 

## 2015-09-17 MED ORDER — TIOTROPIUM BROMIDE MONOHYDRATE 18 MCG IN CAPS: 18.0000 ug | ORAL_CAPSULE | Freq: Every day | RESPIRATORY_TRACT | Status: DC

## 2015-09-17 NOTE — Telephone Encounter (Signed)
Printed rx notified pt rx ready for pick-up, also she want to make appt for flu shot. Made appt for this Friday @ 11:15...[name]

## 2015-09-19 ENCOUNTER — Ambulatory Visit (INDEPENDENT_AMBULATORY_CARE_PROVIDER_SITE_OTHER): Payer: Medicare Other

## 2015-09-19 DIAGNOSIS — Z23 Encounter for immunization: Secondary | ICD-10-CM

## 2015-09-22 ENCOUNTER — Telehealth: Payer: Self-pay | Admitting: *Deleted

## 2015-09-22 NOTE — Telephone Encounter (Signed)
See her last BMET; no kidney issue noted.  I have no idea why she was contacted; I see no order for a nephrology referral

## 2015-09-22 NOTE — Telephone Encounter (Signed)
Left msg on triage stating she received a call from [facility] to get appt set-up for kidney evaluation. Pt states no one has told her that she need to see someone concerning her kidneys. Wanting to know if md is aware & does he think she need to see specialist.../lmb

## 2015-09-23 NOTE — Telephone Encounter (Signed)
Notified pt with md response.../lmb 

## 2015-10-07 ENCOUNTER — Ambulatory Visit (INDEPENDENT_AMBULATORY_CARE_PROVIDER_SITE_OTHER): Payer: Medicare Other | Admitting: Family Medicine

## 2015-10-07 ENCOUNTER — Encounter: Payer: Self-pay | Admitting: Family Medicine

## 2015-10-07 VITALS — BP 140/72 | HR 72 | Ht 63.0 in | Wt 177.0 lb

## 2015-10-07 DIAGNOSIS — IMO0002 Reserved for concepts with insufficient information to code with codable children: Secondary | ICD-10-CM

## 2015-10-07 DIAGNOSIS — M129 Arthropathy, unspecified: Secondary | ICD-10-CM | POA: Diagnosis not present

## 2015-10-07 NOTE — Progress Notes (Signed)
[name] [address] [place], [place] [address] Phone: [phone] Subjective:    I'm seeing this patient by the request  of:  [name], MD   CC: Knee pain. Right-sided   [id number] [name] is a 81 y.o. female coming in with complaint of knee pain. Right-sided. Patient has had this pain for multiple years. Has been told that she has bone-on-bone arthritis. Patient has even had viscous supplementation. In 2 years ago and did not feel any improvement. Patient states that he is having's instability as well. Patient states that sometimes she feels like she can fall. All the pain seems to be on the medial aspect of the knee. Patient does not want to have any surgical intervention and is looking for any other possibilities remain active. Patient denies any radiation down the leg and denies any significant numbness. Patient does have some mild peripheral neuropathy secondary to her other comorbidities. Patient does water aerobics fairly regularly. Patient denies any nighttime pain that seems to wake her up. Rates the severity of 8 out of 10 though when it does cause severe pain. He multiple tolerate any pain medications.    Past Medical History  Diagnosis Date  . Hypopotassemia     PMH of  . Anemia     iron defficiency  . Muscle pain   . Atrial fibrillation or flutter     s/p RFCA 7/08;   s/p DCCV in past;   previously on amiodarone;  amio stopped due to lung toxicity  . CAD (coronary artery disease)     s/p NSTEMI tx with BMS to OM1 3/08;  cath 3/08: pOM 99% tx with PCI, pLAD 20%, ? mod stenosis at the AM  . HLD (hyperlipidemia)   . HTN (hypertension)     essential nos  . Dystrophy, corneal stromal   . Chronic diastolic heart failure ([place])     echo 11/11:  EF 55-60%, severe LVH, mod LAE, mild MR, mildly increased PASP  . Osteoporosis   . Heart murmur   . Anginal pain ([place])   . Myocardial infarction ([place]) 2008  . COPD (chronic  obstructive pulmonary disease) ([place])   . Asthma   . Pneumonia 05/27/2015  . OSA on CPAP   . Type II diabetes mellitus (HCC)     Dr Loanne Drilling  . Seasonal allergies   . Degenerative joint disease   . Arthritis     "knees, feet, hands; joints" (05/27/2015)   Past Surgical History  Procedure Laterality Date  . Cholecystectomy    . Knee cartilage surgery Right   . Eye surgery    . A flutter ablation      Dr Lovena Le  . Gravid 2 para 2    . Colonoscopy  6/07    2 polyps Dr. Kinnie Feil in [place]  . Coronary angioplasty with stent placement  02/2007    BMS w/Dr Lovena Le  . Colonoscopy with polypectomy       X 2; [facility]  . Left and right heart catheterization with coronary angiogram N/A 05/07/2014    Procedure: LEFT AND RIGHT HEART CATHETERIZATION WITH CORONARY ANGIOGRAM;  Surgeon: Peter M Martinique, MD;  Location: [facility] CATH LAB;  Service: Cardiovascular;  Laterality: N/A;  . Cataract extraction w/ intraocular lens  implant, bilateral Bilateral   . Corneal transplant Bilateral    Social History  Substance Use Topics  . Smoking status: Former Smoker -- 0.25 packs/day for 18 years    Types:  Cigarettes    Quit date: 12/27/1968  . Smokeless tobacco: Never Used     Comment: smoked [place], up to 1 pp week  . Alcohol Use: No   Allergies  Allergen Reactions  . Amlodipine Besylate Other (See Comments)    REACTION: tingling in lips & gum edema  . Levaquin [Levofloxacin] Shortness Of Breath and Swelling    angioedema  . Lobster [Shellfish Allergy] Other (See Comments)    angioedema  . Penicillins Rash  . Valsartan Other (See Comments)    REACTION: angioedema  . Codeine Other (See Comments)    Mental status changes  . Tramadol Hcl Nausea And Vomiting   Family History  Problem Relation Age of Onset  . Diabetes Mother   . Hypertension Mother   . Transient ischemic attack Mother   . Heart attack Father 23  . Arthritis Father   . Breast cancer Maternal Aunt   . Arthritis Maternal Aunt   .  Arthritis Mother   . Hypertension Father     Past medical history, social, surgical and family history all reviewed in electronic medical record.   Review of Systems: No headache, visual changes, nausea, vomiting, diarrhea, constipation, dizziness, abdominal pain, skin rash, fevers, chills, night sweats, weight loss, swollen lymph nodes, body aches, joint swelling, muscle aches, chest pain, shortness of breath, mood changes.   Objective Blood pressure 140/72, pulse 72, height 5\' 3"  (1.6 m), weight 177 lb (80.287 kg), SpO2 95 %.  General: No apparent distress alert and oriented x3 mood and affect normal, dressed appropriately.  HEENT: Pupils equal, extraocular movements intact  Respiratory: Patient's speak in full sentences and does not appear short of breath  Cardiovascular: No lower extremity edema, non tender, no erythema  Skin: Warm dry intact with no signs of infection or rash on extremities or on axial skeleton.  Abdomen: Soft nontender  Neuro: Cranial nerves II through XII are intact, neurovascularly intact in all extremities with 2+ DTRs and 2+ pulses.  Lymph: No lymphadenopathy of posterior or anterior cervical chain or axillae bilaterally.  Gait antalgic gait MSK:  Non tender with full range of motion and good stability and symmetric strength and tone of shoulders, elbows, wrist, hip, and ankles bilaterally.  Knee:Right Severe varus deformity noted of the knee with instability. Severe tenderness to palpation over the medial joint line ROM full in flexion and extension and lower leg rotation. Mild instability of the medial aspect of the knee. painful patellar compression. Patellar glide without crepitus. Patellar and quadriceps tendons unremarkable. Hamstring and quadriceps strength is normal.   Procedure note After informed written and verbal consent, patient was seated on exam table. Right knee was prepped with alcohol swab and utilizing anterolateral approach, patient's  right knee space was injected with 4:1  marcaine 0.5%: Kenalog 40mg /dL. Patient tolerated the procedure well without immediate complications.  [id number]; 15 minutes spent for Therapeutic exercises as stated in above notes.  This included exercises focusing on stretching, strengthening, with significant focus on eccentric aspects. Flexion and extension exercises working on the vastus medialis oblique as well as the hip abductor strengthening. Discussed balance and coordination exercises as well.  Proper technique shown and discussed handout in great detail with ATC.  All questions were discussed and answered.       Impression and Recommendations:     This case required medical decision making of moderate complexity.

## 2015-10-07 NOTE — Progress Notes (Signed)
Pre visit review using our clinic review tool, if applicable. No additional management support is needed unless otherwise documented below in the visit note. 

## 2015-10-07 NOTE — Patient Instructions (Signed)
Nice to see you again Ice your knee a few times a day, after a lot of activity and before bedtime Rehab exercises 3x/week We will get you measured for a custom knee brace  Stay as active as you can Take tylenol 325 mg three times a day is the best evidence based medicine we have for arthritis.  Vitamin D 2000 IU daily Tumeric 500mg  daily.  pennsaid pinkie amount topically 2 times daily as needed.   If cortisone injections do not help, there are different types of shots that may help but they take longer to take effect.  We can discuss this at follow up.  It's important that you continue to stay active. We will get a brace Controlling your weight is important.  Consider physical therapy to strengthen muscles around the joint that hurts to take pressure off of the joint itself. Water aerobics and cycling with low resistance are the best two types of exercise for arthritis. Come back and see me in 4 weeks.

## 2015-10-07 NOTE — Assessment & Plan Note (Signed)
Patient does have severe osteoarthritic changes of the knee. We discussed icing regimen, home exercises, topical anti-inflammatories, as well as patient will get a medial unloader brace customized. Patient will try these different interventions and come back and see me again in 4 weeks. If continuing have pain patient would be a candidate for viscous supplementation.

## 2015-10-30 ENCOUNTER — Other Ambulatory Visit: Payer: Self-pay | Admitting: Endocrinology

## 2015-10-31 ENCOUNTER — Telehealth: Payer: Self-pay | Admitting: Internal Medicine

## 2015-10-31 NOTE — Telephone Encounter (Signed)
Please advise, thanks.

## 2015-10-31 NOTE — Telephone Encounter (Signed)
Patient states she is in the donut hole.  She is requesting a diabetic meter and test strips to get her though until the new year.  Patient states she only has one test strip.

## 2015-10-31 NOTE — Telephone Encounter (Signed)
Per dr hopper, we can try to get patient a sample meter and strips when dr Ronnald Ramp returns on Monday---or patient can call her pharmacy to see which meter/strips is cheapest and also check with her insurance co to see what they cover, or check with dr ellison's office to see what they recommend---patient advised and patient will see what she can work out this weekend and call our office back on Monday if she needs sample meter/strips at that time----if patient calls back on Monday needing meter/strips, patient needs to talk with Erabella Kuipers

## 2015-10-31 NOTE — Telephone Encounter (Signed)
Give her a meter which has the cheapest test strips please

## 2015-11-03 NOTE — Telephone Encounter (Signed)
Dr Ronnald Ramp, can you advise what meter and strips (from samples you have) would be appropriate for this patient?  Dr Linna Darner not sure (last week) and won't be back in office until Wednesday afternoon---please advise, thanks

## 2015-11-03 NOTE — Telephone Encounter (Signed)
Patient has called requesting the below samples. She is at home today

## 2015-11-04 NOTE — Telephone Encounter (Signed)
Patient advised, patient coming to office to pick up

## 2015-11-04 NOTE — Telephone Encounter (Signed)
I have test strips and meters for her

## 2015-11-05 ENCOUNTER — Ambulatory Visit (INDEPENDENT_AMBULATORY_CARE_PROVIDER_SITE_OTHER): Payer: Medicare Other | Admitting: Family Medicine

## 2015-11-05 ENCOUNTER — Encounter: Payer: Self-pay | Admitting: Family Medicine

## 2015-11-05 VITALS — BP 138/70 | HR 81 | Ht 63.0 in | Wt 175.0 lb

## 2015-11-05 DIAGNOSIS — M129 Arthropathy, unspecified: Secondary | ICD-10-CM | POA: Diagnosis not present

## 2015-11-05 DIAGNOSIS — IMO0002 Reserved for concepts with insufficient information to code with codable children: Secondary | ICD-10-CM

## 2015-11-05 NOTE — Progress Notes (Signed)
Pre visit review using our clinic review tool, if applicable. No additional management support is needed unless otherwise documented below in the visit note. 

## 2015-11-05 NOTE — Progress Notes (Signed)
[facility] [place], [place] [address] Phone: [phone] Subjective:    I'm seeing this patient by the request  of:  [name], MD   CC: Knee pain. Right-sided follow-up   [id number] [name] is a 81 y.o. female coming in with complaint of knee pain. Right-sided. Patient was found to have arthritic changes the knee. Patient was given an injection in the knee as well as to do conservative therapy with home exercises, icing and patient was set up with a custom brace. Patient has been wearing the brace fairly regularly and doing the exercises. States overall she is approximately 70% better but not completely pain-free. Patient states that she think she just needs to change her perception and goals. Patient tries to stay active home on daily basis. Denies any giving out on her. Denies any new symptoms.    Past Medical History  Diagnosis Date  . Hypopotassemia     PMH of  . Anemia     iron defficiency  . Muscle pain   . Atrial fibrillation or flutter     s/p RFCA 7/08;   s/p DCCV in past;   previously on amiodarone;  amio stopped due to lung toxicity  . CAD (coronary artery disease)     s/p NSTEMI tx with BMS to OM1 3/08;  cath 3/08: pOM 99% tx with PCI, pLAD 20%, ? mod stenosis at the AM  . HLD (hyperlipidemia)   . HTN (hypertension)     essential nos  . Dystrophy, corneal stromal   . Chronic diastolic heart failure ([place])     echo 11/11:  EF 55-60%, severe LVH, mod LAE, mild MR, mildly increased PASP  . Osteoporosis   . Heart murmur   . Anginal pain ([place])   . Myocardial infarction ([place]) 2008  . COPD (chronic obstructive pulmonary disease) ([place])   . Asthma   . Pneumonia 05/27/2015  . OSA on CPAP   . Type II diabetes mellitus (HCC)     Dr Loanne Drilling  . Seasonal allergies   . Degenerative joint disease   . Arthritis     "knees, feet, hands; joints" (05/27/2015)   Past Surgical History  Procedure Laterality Date  .  Cholecystectomy    . Knee cartilage surgery Right   . Eye surgery    . A flutter ablation      Dr Lovena Le  . Gravid 2 para 2    . Colonoscopy  6/07    2 polyps Dr. Kinnie Feil in [place]  . Coronary angioplasty with stent placement  02/2007    BMS w/Dr Lovena Le  . Colonoscopy with polypectomy       X 2; [facility]  . Left and right heart catheterization with coronary angiogram N/A 05/07/2014    Procedure: LEFT AND RIGHT HEART CATHETERIZATION WITH CORONARY ANGIOGRAM;  Surgeon: Peter M Martinique, MD;  Location: [facility] CATH LAB;  Service: Cardiovascular;  Laterality: N/A;  . Cataract extraction w/ intraocular lens  implant, bilateral Bilateral   . Corneal transplant Bilateral    Social History  Substance Use Topics  . Smoking status: Former Smoker -- 0.25 packs/day for 18 years    Types: Cigarettes    Quit date: 12/27/1968  . Smokeless tobacco: Never Used     Comment: smoked [place], up to 1 pp week  . Alcohol Use: No   Allergies  Allergen Reactions  . Amlodipine Besylate Other (See Comments)    REACTION: tingling in  lips & gum edema  . Levaquin [Levofloxacin] Shortness Of Breath and Swelling    angioedema  . Lobster [Shellfish Allergy] Other (See Comments)    angioedema  . Penicillins Rash  . Valsartan Other (See Comments)    REACTION: angioedema  . Codeine Other (See Comments)    Mental status changes  . Tramadol Hcl Nausea And Vomiting   Family History  Problem Relation Age of Onset  . Diabetes Mother   . Hypertension Mother   . Transient ischemic attack Mother   . Heart attack Father 50  . Arthritis Father   . Breast cancer Maternal Aunt   . Arthritis Maternal Aunt   . Arthritis Mother   . Hypertension Father     Past medical history, social, surgical and family history all reviewed in electronic medical record.   Review of Systems: No headache, visual changes, nausea, vomiting, diarrhea, constipation, dizziness, abdominal pain, skin rash, fevers, chills, night sweats, weight  loss, swollen lymph nodes, body aches, joint swelling, muscle aches, chest pain, shortness of breath, mood changes.   Objective Blood pressure 138/70, pulse 81, height 5\' 3"  (1.6 m), weight 175 lb (79.379 kg), SpO2 93 %.  General: No apparent distress alert and oriented x3 mood and affect normal, dressed appropriately.  HEENT: Pupils equal, extraocular movements intact  Respiratory: Patient's speak in full sentences and does not appear short of breath  Cardiovascular: No lower extremity edema, non tender, no erythema  Skin: Warm dry intact with no signs of infection or rash on extremities or on axial skeleton.  Abdomen: Soft nontender  Neuro: Cranial nerves II through XII are intact, neurovascularly intact in all extremities with 2+ DTRs and 2+ pulses.  Lymph: No lymphadenopathy of posterior or anterior cervical chain or axillae bilaterally.  Gait antalgic gait MSK:  Non tender with full range of motion and good stability and symmetric strength and tone of shoulders, elbows, wrist, hip, and ankles bilaterally.  Knee:Right Severe varus deformity noted of the knee with instability. Less tender over the medial joint line ROM full in flexion and extension and lower leg rotation. Mild instability of the medial aspect of the knee. painful patellar compression. Patellar glide without crepitus. Patellar and quadriceps tendons unremarkable. Hamstring and quadriceps strength is normal.           Impression and Recommendations:     This case required medical decision making of moderate complexity.

## 2015-11-05 NOTE — Assessment & Plan Note (Signed)
Patient is doing relatively well after this. We discussed that if he wanted to try to continue to increase we can try viscous supplementation which patient declined. We also discussed the possibility of formal physical therapy. Patient has declined this as well. We discussed icing regimen and continuing all other conservative therapy. Patient will come back and see me again in 4-6 weeks and if any worsening pain we may consider another steroid injection.

## 2015-11-05 NOTE — Patient Instructions (Addendum)
Good to see you Ice will be helpful COntinue the brace with activity See me again in 6 weeks if you want another injections Happy holidays!

## 2015-11-18 ENCOUNTER — Ambulatory Visit: Payer: Medicare Other | Admitting: Endocrinology

## 2015-11-29 ENCOUNTER — Other Ambulatory Visit: Payer: Self-pay | Admitting: Internal Medicine

## 2015-12-05 ENCOUNTER — Other Ambulatory Visit: Payer: Self-pay | Admitting: Endocrinology

## 2015-12-05 NOTE — Telephone Encounter (Signed)
Please advise if ok to refill. Medication is listed under a historical provider.  Thanks!  

## 2015-12-08 ENCOUNTER — Ambulatory Visit (INDEPENDENT_AMBULATORY_CARE_PROVIDER_SITE_OTHER): Payer: Medicare Other | Admitting: Endocrinology

## 2015-12-08 ENCOUNTER — Telehealth: Payer: Self-pay | Admitting: Endocrinology

## 2015-12-08 ENCOUNTER — Encounter: Payer: Self-pay | Admitting: Endocrinology

## 2015-12-08 VITALS — BP 124/76 | HR 86 | Temp 97.9°F | Resp 12 | Ht 63.0 in | Wt 177.4 lb

## 2015-12-08 DIAGNOSIS — E042 Nontoxic multinodular goiter: Secondary | ICD-10-CM | POA: Diagnosis not present

## 2015-12-08 DIAGNOSIS — E1165 Type 2 diabetes mellitus with hyperglycemia: Secondary | ICD-10-CM

## 2015-12-08 DIAGNOSIS — Z794 Long term (current) use of insulin: Secondary | ICD-10-CM

## 2015-12-08 DIAGNOSIS — E0590 Thyrotoxicosis, unspecified without thyrotoxic crisis or storm: Secondary | ICD-10-CM

## 2015-12-08 DIAGNOSIS — IMO0002 Reserved for concepts with insufficient information to code with codable children: Secondary | ICD-10-CM

## 2015-12-08 LAB — T4, FREE: FREE T4: 0.71 ng/dL (ref 0.60–1.60)

## 2015-12-08 LAB — POCT GLYCOSYLATED HEMOGLOBIN (HGB A1C): HEMOGLOBIN A1C: 7.6

## 2015-12-08 LAB — TSH: TSH: 2.63 u[IU]/mL (ref 0.35–4.50)

## 2015-12-08 NOTE — Telephone Encounter (Signed)
please call patient: doing your records today, I noticed you are due for a recheck of the [place].   you will receive a phone call, about a day and time for an appointment

## 2015-12-08 NOTE — Patient Instructions (Addendum)
check your blood sugar twice a day.  vary the time of day when you check, between before the 3 meals, and at bedtime.  also check if you have symptoms of your blood sugar being too high or too low.  please keep a record of the readings and bring it to your next appointment here.  please call us sooner if your blood sugar goes below 70, or if you have a lot of readings over 200.   A thyroid blood test is requested for you today.  We'll let you know about the results.  On this type of insulin schedule, you should eat meals on a regular schedule.  If a meal is missed or significantly delayed, your blood sugar could go low.  Please continue the same insulin.    Please come back for a follow-up appointment in 3 months.

## 2015-12-08 NOTE — Progress Notes (Signed)
Subjective:    Patient ID: [name], female    DOB: 1931/08/25, 81 y.o.   MRN: [id number]  HPI Pt returns for f/u of diabetes mellitus: DM type: Insulin-requiring type 2.  Dx'ed: [id number] Complications: CAD Therapy: insulin since 2007.  GDM: never DKA: never Severe hypoglycemia: never Pancreatitis: never. Other: she changed to qd insulin, after poor results with multiple daily injections.   Interval history: she brings a record of her cbg's which i have reviewed today.  It varies from 70-200's.  She says it is lower when she misses a meal.  Pt also returns for f/u of hyperthyroidism (dx'ed 2012; [place] then showed multinodular goiter; tapazole was chose as rx, due to low uptake on nuc med scan).  she has gained a few lbs.   Past Medical History  Diagnosis Date  . Hypopotassemia     PMH of  . Anemia     iron defficiency  . Muscle pain   . Atrial fibrillation or flutter     s/p RFCA 7/08;   s/p DCCV in past;   previously on amiodarone;  amio stopped due to lung toxicity  . CAD (coronary artery disease)     s/p NSTEMI tx with BMS to OM1 3/08;  cath 3/08: pOM 99% tx with PCI, pLAD 20%, ? mod stenosis at the AM  . HLD (hyperlipidemia)   . HTN (hypertension)     essential nos  . Dystrophy, corneal stromal   . Chronic diastolic heart failure ([place])     echo 11/11:  EF 55-60%, severe LVH, mod LAE, mild MR, mildly increased PASP  . Osteoporosis   . Heart murmur   . Anginal pain ([place])   . Myocardial infarction ([place]) 2008  . COPD (chronic obstructive pulmonary disease) ([place])   . Asthma   . Pneumonia 05/27/2015  . OSA on CPAP   . Type II diabetes mellitus (H[facility])     Dr Loanne Drilling  . Seasonal allergies   . Degenerative joint disease   . Arthritis     "knees, feet, hands; joints" (05/27/2015)    Past Surgical History  Procedure Laterality Date  . Cholecystectomy    . Knee cartilage surgery Right   . Eye surgery    . A flutter ablation      Dr Lovena Le  . Gravid 2 para 2    .  Colonoscopy  6/07    2 polyps Dr. Kinnie Feil in [place]  . Coronary angioplasty with stent placement  02/2007    BMS w/Dr Lovena Le  . Colonoscopy with polypectomy       X 2; [facility]  . Left and right heart catheterization with coronary angiogram N/A 05/07/2014    Procedure: LEFT AND RIGHT HEART CATHETERIZATION WITH CORONARY ANGIOGRAM;  Surgeon: Peter M Martinique, MD;  Location: [facility] CATH LAB;  Service: Cardiovascular;  Laterality: N/A;  . Cataract extraction w/ intraocular lens  implant, bilateral Bilateral   . Corneal transplant Bilateral     Social History   Social History  . Marital Status: Widowed    Spouse Name: N/A  . Number of Children: N/A  . Years of Education: N/A   Occupational History  . Teacher    Social History Main Topics  . Smoking status: Former Smoker -- 0.25 packs/day for 18 years    Types: Cigarettes    Quit date: 12/27/1968  . Smokeless tobacco: Never Used     Comment: smoked [place], up to 1 pp week  . Alcohol  Use: No  . Drug Use: No  . Sexual Activity: Yes   Other Topics Concern  . Not on file   Social History Narrative   Teacher. Widowed. Rarely drinks cafeine.     Current Outpatient Prescriptions on File Prior to Visit  Medication Sig Dispense Refill  . apixaban (ELIQUIS) 5 MG TABS tablet Take 1 tablet (5 mg total) by mouth 2 (two) times daily. 60 tablet 3  . carvedilol (COREG) 12.5 MG tablet Take 1 tablet (12.5 mg total) by mouth 2 (two) times daily with a meal. 60 tablet 3  . furosemide (LASIX) 40 MG tablet Take 2 tablets (80 mg total) by mouth daily. 180 tablet 3  . insulin NPH Human (HUMULIN N) 100 UNIT/ML injection Inject 0.35 mLs (35 Units total) into the skin daily before breakfast. 10 mL 11  . methimazole (TAPAZOLE) 5 MG tablet TAKE 1 TABLET BY MOUTH 3 TIMES A WEEK ON MONDAY, WEDNESDAY & FRIDAY 40 tablet 1  . nitroGLYCERIN (NITROSTAT) 0.4 MG SL tablet Place 0.4 mg under the tongue every 5 (five) minutes as needed for chest pain.    . potassium  chloride SA (K-DUR,KLOR-CON) 20 MEQ tablet Take 1 tablet (20 mEq total) by mouth daily. 30 tablet 3  . tiotropium (SPIRIVA) 18 MCG inhalation capsule Place 1 capsule (18 mcg total) into inhaler and inhale daily. 90 capsule 3   No current facility-administered medications on file prior to visit.    Allergies  Allergen Reactions  . Amlodipine Besylate Other (See Comments)    REACTION: tingling in lips & gum edema  . Levaquin [Levofloxacin] Shortness Of Breath and Swelling    angioedema  . Lobster [Shellfish Allergy] Other (See Comments)    angioedema  . Penicillins Rash  . Valsartan Other (See Comments)    REACTION: angioedema  . Codeine Other (See Comments)    Mental status changes  . Tramadol Hcl Nausea And Vomiting    Family History  Problem Relation Age of Onset  . Diabetes Mother   . Hypertension Mother   . Transient ischemic attack Mother   . Heart attack Father 28  . Arthritis Father   . Breast cancer Maternal Aunt   . Arthritis Maternal Aunt   . Arthritis Mother   . Hypertension Father     BP 124/76 mmHg  Pulse 86  Temp(Src) 97.9 F (36.6 C) (Oral)  Resp 12  Ht 5\' 3"  (1.6 m)  Wt 177 lb 6.4 oz (80.468 kg)  BMI 31.43 kg/m2  SpO2 95%     Review of Systems She denies fever    Objective:   Physical Exam VITAL SIGNS:  See vs page GENERAL: no distress Neck: thyroid is slightly enlarged, with slightly irregular surface.   Pulses: dorsalis pedis intact bilat.   MSK: no deformity of the feet CV: no leg edema Skin:  no ulcer on the feet.  normal color and temp on the feet. Neuro: sensation is intact to touch on the feet   A1c=7.6%  Lab Results  Component Value Date   TSH 2.63 12/08/2015       Assessment & Plan:  Hyperthyroidism: well-controlled.   Multinodular goiter, due for recheck.   DM: this is the best control this pt should aim for, given this regimen, which does match insulin to her changing needs throughout the day.   Patient is advised  the following: Patient Instructions  check your blood sugar twice a day.  vary the time of day when you check, between  before the 3 meals, and at bedtime.  also check if you have symptoms of your blood sugar being too high or too low.  please keep a record of the readings and bring it to your next appointment here.  please call us sooner if your blood sugar goes below 70, or if you have a lot of readings over 200.   A thyroid blood test is requested for you today.  We'll let you know about the results.  On this type of insulin schedule, you should eat meals on a regular schedule.  If a meal is missed or significantly delayed, your blood sugar could go low.  Please continue the same insulin.    Please come back for a follow-up appointment in 3 months.     addendum: recheck [place].   Please continue the same methimazole.

## 2015-12-09 ENCOUNTER — Telehealth: Payer: Self-pay | Admitting: Endocrinology

## 2015-12-09 NOTE — Telephone Encounter (Signed)
Patient stated her thyroid labs came back normal, she is wondering why Dr Loanne Drilling want her to have a ultrasound If her labs are normal, and if she decide to have the ultrasound how soon should she get it done?? Please advise

## 2015-12-09 NOTE — Telephone Encounter (Signed)
Left a voicemail advising of note below. Requested call back to verify if the pt would like to proceed with the ultrasound.

## 2015-12-09 NOTE — Telephone Encounter (Signed)
There are 2 subjects: 1 is the thyroid level itself. The other is that we need to make sure the thyroid has not enlarged. Of course, you don't have to do the [place], but it is a good idea.

## 2015-12-09 NOTE — Telephone Encounter (Signed)
I contacted the pt and advise of note below. Requested a call back if the pt would like to discuss.

## 2015-12-09 NOTE — Telephone Encounter (Signed)
See note below and please advise, Thanks! 

## 2015-12-10 NOTE — Telephone Encounter (Signed)
Patient stated that understood everthing and gave you the go ahead to schedule the ultrasound.

## 2015-12-10 NOTE — Telephone Encounter (Signed)
Noted referral sent  

## 2015-12-15 ENCOUNTER — Ambulatory Visit: Payer: Medicare Other | Admitting: Family Medicine

## 2015-12-23 ENCOUNTER — Ambulatory Visit
Admission: RE | Admit: 2015-12-23 | Discharge: 2015-12-23 | Disposition: A | Discharge status: Home / Self Care | Payer: Medicare Other | Source: Ambulatory Visit | Attending: Endocrinology | Admitting: Endocrinology

## 2015-12-23 DIAGNOSIS — E042 Nontoxic multinodular goiter: Secondary | ICD-10-CM

## 2015-12-30 ENCOUNTER — Other Ambulatory Visit: Payer: Self-pay

## 2015-12-30 ENCOUNTER — Telehealth: Payer: Self-pay | Admitting: Endocrinology

## 2015-12-30 MED ORDER — GLUCOSE BLOOD VI STRP: ORAL_STRIP | Status: DC

## 2015-12-30 MED ORDER — INSULIN NPH (HUMAN) (ISOPHANE) 100 UNIT/ML ~~LOC~~ SUSP: 35.0000 [IU] | Freq: Every day | SUBCUTANEOUS | Status: DC

## 2015-12-30 MED ORDER — ONETOUCH ULTRASOFT LANCETS MISC: Status: DC

## 2015-12-30 MED ORDER — METHIMAZOLE 5 MG PO TABS: ORAL_TABLET | ORAL | Status: DC

## 2015-12-30 NOTE — Telephone Encounter (Signed)
Pt has new insurance and now needs all meds to be called in to [facility] on [address], right now she needs one touch test strips and lancets.

## 2015-12-30 NOTE — Telephone Encounter (Signed)
Rx sent per pt's request.  

## 2016-01-05 ENCOUNTER — Telehealth: Payer: Self-pay | Admitting: Endocrinology

## 2016-01-05 MED ORDER — ONETOUCH ULTRASOFT LANCETS MISC: Status: DC

## 2016-01-05 NOTE — Telephone Encounter (Signed)
One touch verio flex needs new rx for lancets, please call to [facility] on [address] [phone]  [facility] she can no longer use that pharmacy with her insurance

## 2016-01-05 NOTE — Telephone Encounter (Signed)
Rx submitted per pt's request.  

## 2016-01-06 ENCOUNTER — Telehealth (HOSPITAL_COMMUNITY): Payer: Self-pay

## 2016-01-06 NOTE — Telephone Encounter (Signed)
Patient left VM on CHF triage line to request follow up apt with Dr. Haroldine Laws.  Patient feels well, just wants a routine follow up.  Will ask CHF scheduler Kamilah to schedule patient.  [name]

## 2016-01-28 ENCOUNTER — Ambulatory Visit (HOSPITAL_COMMUNITY)
Admission: RE | Admit: 2016-01-28 | Discharge: 2016-01-28 | Disposition: A | Discharge status: Home / Self Care | Payer: Medicare Other | Source: Ambulatory Visit | Attending: Internal Medicine | Admitting: Internal Medicine

## 2016-01-28 ENCOUNTER — Encounter (HOSPITAL_COMMUNITY): Payer: Self-pay | Admitting: Internal Medicine

## 2016-01-28 VITALS — BP 122/72 | HR 76 | Wt 179.5 lb

## 2016-01-28 DIAGNOSIS — G4733 Obstructive sleep apnea (adult) (pediatric): Secondary | ICD-10-CM | POA: Diagnosis not present

## 2016-01-28 DIAGNOSIS — I272 Other secondary pulmonary hypertension: Secondary | ICD-10-CM | POA: Diagnosis not present

## 2016-01-28 DIAGNOSIS — I110 Hypertensive heart disease with heart failure: Secondary | ICD-10-CM | POA: Diagnosis not present

## 2016-01-28 DIAGNOSIS — I5032 Chronic diastolic (congestive) heart failure: Secondary | ICD-10-CM | POA: Diagnosis not present

## 2016-01-28 DIAGNOSIS — E663 Overweight: Secondary | ICD-10-CM | POA: Insufficient documentation

## 2016-01-28 DIAGNOSIS — I482 Chronic atrial fibrillation, unspecified: Secondary | ICD-10-CM

## 2016-01-28 DIAGNOSIS — R001 Bradycardia, unspecified: Secondary | ICD-10-CM | POA: Diagnosis not present

## 2016-01-28 NOTE — Progress Notes (Signed)
ADVANCED HF CLINIC NOTE  Patient ID: [name], female   DOB: 23-Sep-1931, 82 y.o.   MRN: [id number]  Referring Physician: Lovena Le Primary Care: Billey Gosling Primary Cardiologist:Taylor  HPI: [name] is a pleasant 82 yo woman with CAD, HTN, atrial fibrillation/A flutter and chronic prior systolic heart failure, (which was likely rate related) and diastolic heart failure. She was referred by Dr. Lovena Le for further evaluation of Pulmonary HTN.   Underwent 2D echo in 3/15 which showed EF 60-65% mild LVH. RV was normal. No significant valvular abnormalities.   Underwent R/L cath 05/07/14 Which showed stable CAD and significant PH  RA 6 RV 71/7 6 PA 63/17 (33)  PCWP 19 mm Hg  LV 174/16 mm Hg  AO 171/62 mean 103 mm Hg  PVR = 2.86 Oxygen saturations:  PA 69%  AO 99%  Cardiac Output/Index (Fick) 4.88/2.7    Left mainstem: Normal.  Left anterior descending (LAD): Moderate calcification proximally. Mild irregularities less than 10%. The first diagonal has 40-50% ostial disease.  Left circumflex (LCx): The LCx gives rise to a large OM1 then terminates in the AV groove. There is 20% disease in the proximal LCx. The first OM stent is patent with diffuse 20% disease.  Right coronary artery (RCA): The RCA arises anteriorly. There is an eccentric slit like stenosis in the proximal vessel to 60-70%. The mid vessel has segmental 70-80% disease.  Left ventriculography: Left ventricular systolic function is normal, LVEF is estimated at 55-65%, there is no significant mitral regurgitation   We saw her for an initial visit in May 2105 for pHTN and DOE. At that time she was quite dyspneic with activity. HR was in 40-50 range on carvedilol 50 bid and digoxin. We stopped her digoxin and cut carvedilol back to 25 bid. PFTs, VQ and CXR were ordered. I also asked her to decrease her H2O intake. We saw her back last month and she was feeling much better. Given results of PFTs overnight oximeter was  placed and referred to Dr. Gwenette Greet. She has had overnight oximetry which showed oxygen desaturation as low as 83%, and spent 97 minutes less than 88% during the night.   Follow-up:  Here for 6 month f/u. Says she is feeling quite well. Says breathing is good. Doing all ADLs without problem. BP well controlled. Eating well. Drinking 1-2 glasses of wine per night. No edema, orthopnea or PND. Taking lasix 80mg  daily. No need to take extra. Tries not weight herself. Goes to [employer] for aerobics and [employer]. No bleeding with Eliquis.   Results:  05/21/14: VQ normal 5/15: CXR: 3/16: Echo 3/16 EF 60-65% RV normal. Mild TR No RVSP measured   Emphysema. Otherwise normal.  5/15: PFTs FEV1  1.4 (95%) FVC    1.89 (110%) FEV/FVC 88% FEF 25-75%  66% DLCO 39%  BP rest: 138/58 BP peak: 182/66 Peak VO2: 8.9 (69.1% predicted peak VO2) VE/VCO2 slope: 34.6 OUES: Peak RER: 1.24 Ventilatory Threshold: 5.8 (50% predicted and 65% measured peak VO2) Peak RR 38 Peak Ventilation: 32.7 VE/MVV: 71%  PETCO2 at peak: 36 O2pulse: 8 (100% predicted O2pulse)     Labs  05/23/15  Creatinine 1.2 05/31/15    Creatinine 1.65    Current Outpatient Prescriptions  Medication Sig Dispense Refill  . apixaban (ELIQUIS) 5 MG TABS tablet Take 1 tablet (5 mg total) by mouth 2 (two) times daily. 60 tablet 3  . carvedilol (COREG) 12.5 MG tablet Take 1 tablet (12.5 mg total) by mouth  2 (two) times daily with a meal. 60 tablet 3  . furosemide (LASIX) 40 MG tablet Take 2 tablets (80 mg total) by mouth daily. 180 tablet 3  . glucose blood (ONE TOUCH TEST STRIPS) test strip Use to check blood sugar 2 times per day. Dx Code E11.9 200 each 2  . insulin NPH Human (HUMULIN N,NOVOLIN N) 100 UNIT/ML injection Inject 35 Units into the skin.    . Lancets (ONETOUCH ULTRASOFT) lancets Use to check blood sugar 2 times per day. Dx Code E11.9 200 each 2  . methimazole (TAPAZOLE) 5 MG tablet TAKE 1 TABLET BY MOUTH 3 TIMES A WEEK ON  MONDAY, WEDNESDAY & FRIDAY 40 tablet 1  . potassium chloride SA (K-DUR,KLOR-CON) 20 MEQ tablet Take 1 tablet (20 mEq total) by mouth daily. 30 tablet 3  . tiotropium (SPIRIVA) 18 MCG inhalation capsule Place 1 capsule (18 mcg total) into inhaler and inhale daily. 90 capsule 3  . nitroGLYCERIN (NITROSTAT) 0.4 MG SL tablet Place 0.4 mg under the tongue every 5 (five) minutes as needed for chest pain. Reported on 01/28/2016     No current facility-administered medications for this encounter.    Allergies  Allergen Reactions  . Amlodipine Besylate Other (See Comments)    REACTION: tingling in lips & gum edema  . Levaquin [Levofloxacin] Shortness Of Breath and Swelling    angioedema  . Lobster [Shellfish Allergy] Other (See Comments)    angioedema  . Penicillins Rash  . Valsartan Other (See Comments)    REACTION: angioedema  . Codeine Other (See Comments)    Mental status changes  . Tramadol Hcl Nausea And Vomiting   PHYSICAL EXAM: Filed Vitals:   01/28/16 1014  BP: 122/72  Pulse: 76  Weight: 179 lb 8 oz (81.421 kg)  SpO2: 97%    General:  Looks younger than stated age. Well appearing. No respiratory difficulty HEENT: normal Neck: supple. JVP 5-6 Carotids 2+ bilat; no bruits. No lymphadenopathy or thryomegaly appreciated. Cor: PMI nondisplaced.  Irregular rate & rhythm. No rubs, gallops or murmurs. Lungs: clear with diminished BS throughout. No wheeze Abdomen: soft, nontender, nondistended. No hepatosplenomegaly. No bruits or masses. Good bowel sounds. Extremities: no cyanosis, clubbing, rash, no edema Neuro: alert & oriented x 3, cranial nerves grossly intact. moves all 4 extremities w/o difficulty. Affect pleasant.   ASSESSMENT & PLAN: 1) Mild pulmonary HTN  2) Chronic diastolic HF, NYHA II 3) Overweight 4) Chronic atrial fib/flutter 5) Bradycardia - resolved 6) OSA 7) HTN - well controlled  Doing very well. Volume status looks great. BP well controlled. Continue current  regimen. RTC in 4 months.   Trish Mancinelli,MD 10:53 AM

## 2016-01-28 NOTE — Patient Instructions (Signed)
Follow up in 4 months with Dr.Bensimhon.

## 2016-02-02 ENCOUNTER — Ambulatory Visit (INDEPENDENT_AMBULATORY_CARE_PROVIDER_SITE_OTHER): Payer: Medicare Other | Admitting: Internal Medicine

## 2016-02-02 ENCOUNTER — Encounter: Payer: Self-pay | Admitting: Internal Medicine

## 2016-02-02 VITALS — BP 150/66 | HR 69 | Temp 98.2°F | Resp 16 | Ht 63.0 in | Wt 181.0 lb

## 2016-02-02 DIAGNOSIS — I2510 Atherosclerotic heart disease of native coronary artery without angina pectoris: Secondary | ICD-10-CM

## 2016-02-02 DIAGNOSIS — D510 Vitamin B12 deficiency anemia due to intrinsic factor deficiency: Secondary | ICD-10-CM | POA: Diagnosis not present

## 2016-02-02 DIAGNOSIS — E1165 Type 2 diabetes mellitus with hyperglycemia: Secondary | ICD-10-CM | POA: Diagnosis not present

## 2016-02-02 DIAGNOSIS — IMO0002 Reserved for concepts with insufficient information to code with codable children: Secondary | ICD-10-CM

## 2016-02-02 DIAGNOSIS — Z1231 Encounter for screening mammogram for malignant neoplasm of breast: Secondary | ICD-10-CM

## 2016-02-02 DIAGNOSIS — E0590 Thyrotoxicosis, unspecified without thyrotoxic crisis or storm: Secondary | ICD-10-CM | POA: Diagnosis not present

## 2016-02-02 DIAGNOSIS — Z0000 Encounter for general adult medical examination without abnormal findings: Secondary | ICD-10-CM | POA: Diagnosis not present

## 2016-02-02 DIAGNOSIS — I10 Essential (primary) hypertension: Secondary | ICD-10-CM

## 2016-02-02 DIAGNOSIS — I5032 Chronic diastolic (congestive) heart failure: Secondary | ICD-10-CM

## 2016-02-02 DIAGNOSIS — E782 Mixed hyperlipidemia: Secondary | ICD-10-CM | POA: Diagnosis not present

## 2016-02-02 DIAGNOSIS — Z794 Long term (current) use of insulin: Secondary | ICD-10-CM

## 2016-02-02 NOTE — Assessment & Plan Note (Signed)
Typically well controlled  Continue current medication

## 2016-02-02 NOTE — Assessment & Plan Note (Signed)
euvolemic here today Continue current medications

## 2016-02-02 NOTE — Assessment & Plan Note (Signed)
Check b12 level  

## 2016-02-02 NOTE — Assessment & Plan Note (Signed)
Follows with Dr. Loanne Drilling Tsh, ft4 ordered - he will manage medications

## 2016-02-02 NOTE — Progress Notes (Signed)
Subjective:    Patient ID: [name], female    DOB: 02/18/1931, 82 y.o.   MRN: [id number]  HPI She is here for a physical exam/wellness exam.  Severe right knee OA:  She would like to hold off on surgery if possible.  She uses a cane to walk.  She is doing water aerobics and can do one of the machines at the gym.   Diabetes: She is taking her medication daily as prescribed. She is not compliant with a diabetic diet. She is exercising regularly. She monitors her sugars and they have been controlled.  She is up-to-date with an ophthalmology examination.   CHF, CAD:  She follows with cardiology.  She is taking her medication daily as prescribed.  She has some shortness of breath with moderate-strenuous exertion, which is chronic and unchanged.  She has rare palpitations and mild leg swelling.  She denies chest pain, lightheadedness and dizziness.  OSA: she does not use her cpap.  She denies fatigue during the day.  She did not like using the cpap and does not feel that she needs it at this time. She did have documented nocturnal hypoxia.  She has an appt next month with pulmonary.    Here for medicare wellness.   I have personally reviewed and have noted 1.The patient's medical and social history 2.Their use of alcohol, tobacco or illicit drugs 3.Their current medications and supplements 4.The patient's functional ability including ADL's, fall risks, home safety risks and                 hearing or visual impairment. 5.Diet and physical activities 6.Evidence for depression or mood disorders 7.Care team reviewed and updated (available in snapshot)   Are there smokers in your home (other than you)? No  Risk Factors Exercise: goes [employer] four times a week - water aerobics Dietary issues discussed: fruits, veges, poultry and fish, sweets too often  Cardiac risk factors: advanced age, hypertension, hyperlipidemia,  diabetes and obesity.  Depression Screen  Have you felt down, depressed or hopeless? No  Have you felt little interest or pleasure in doing things?  No Activities of Daily Living In your present state of health, do you have any difficulty performing the following activities?:  Driving? Yes Managing money?  Yes Feeding yourself? Yes Getting from bed to chair? Yes Climbing a flight of stairs? Yes Preparing food and eating?: Yes Bathing or showering? Yes Getting dressed: Yes Getting to/using the toilet? Yes Moving around from place to place: Yes In the past year have you fallen or had a near fall?: No   Are you sexually active?  No  Do you have more than one partner?  N/A  Hearing Difficulties: yes Do you often ask people to speak up or repeat themselves? No Do you experience ringing or noises in your ears? No Do you have difficulty understanding soft or whispered voices? yes Vision:              Any change in vision:  no             Up to date with eye exam:  yes Memory:  Do you feel that you have a problem with memory? No  Do you often misplace items? No  Do you feel safe at home?  Yes  Cognitive Testing  Alert, Orientated? Yes  Normal Appearance? Yes  Recall of three objects?  Yes  Can perform simple calculations? Yes  Displays appropriate judgment? Yes  Can read the correct time from a watch face? Yes   Advanced Directives have been discussed with the patient? Yes     Medications and allergies reviewed with patient and updated if appropriate.  Patient Active Problem List   Diagnosis Date Noted  . Acute encephalopathy   . Acute renal failure superimposed on stage 3 chronic kidney disease ([place])   . Acute on chronic diastolic CHF (congestive heart failure) ([place])   . Acute respiratory failure with hypoxia ([place]) 05/24/2015  . CAP (community acquired pneumonia) 05/24/2015  . Abnormal urinalysis 05/24/2015  . Sepsis ([place]) 05/24/2015  . OSA on CPAP 08/28/2014  . DJD  (degenerative joint disease) of knee 08/16/2014  . Chronic diastolic heart failure ([place]) 07/30/2014  . Nocturnal hypoxemia 07/09/2014  . Dyspnea 05/15/2014  . Pulmonary HTN ([place]) 05/15/2014  . Crescendo angina ([place]) 05/02/2014  . Hyperthyroidism 11/04/2011  . Multinodular goiter 10/14/2011  . LUMBAR RADICULOPATHY, RIGHT 09/23/2010  . Uncontrolled type 2 diabetes mellitus with insulin therapy ([place]) 06/15/2010  . MUSCLE PAIN 06/15/2010  . ATRIAL FIBRILLATION 06/03/2010  . CHF 02/17/2010  . VITILIGO 11/07/2009  . SINOATRIAL NODE DYSFUNCTION 06/16/2009  . Iron deficiency anemia 01/02/2009  . ANEMIA, PERNICIOUS 11/20/2008  . DEGENERATIVE JOINT DISEASE 11/20/2008  . Atrial flutter ([place]) 06/25/2008  . HYPERLIPIDEMIA 01/29/2008  . Coronary atherosclerosis 01/29/2008  . HTN (hypertension) 10/30/2007    Current Outpatient Prescriptions on File Prior to Visit  Medication Sig Dispense Refill  . apixaban (ELIQUIS) 5 MG TABS tablet Take 1 tablet (5 mg total) by mouth 2 (two) times daily. 60 tablet 3  . carvedilol (COREG) 12.5 MG tablet Take 1 tablet (12.5 mg total) by mouth 2 (two) times daily with a meal. 60 tablet 3  . furosemide (LASIX) 40 MG tablet Take 2 tablets (80 mg total) by mouth daily. 180 tablet 3  . glucose blood (ONE TOUCH TEST STRIPS) test strip Use to check blood sugar 2 times per day. Dx Code E11.9 200 each 2  . insulin NPH Human (HUMULIN N,NOVOLIN N) 100 UNIT/ML injection Inject 35 Units into the skin.    . Lancets (ONETOUCH ULTRASOFT) lancets Use to check blood sugar 2 times per day. Dx Code E11.9 200 each 2  . methimazole (TAPAZOLE) 5 MG tablet TAKE 1 TABLET BY MOUTH 3 TIMES A WEEK ON MONDAY, WEDNESDAY & FRIDAY 40 tablet 1  . nitroGLYCERIN (NITROSTAT) 0.4 MG SL tablet Place 0.4 mg under the tongue every 5 (five) minutes as needed for chest pain. Reported on 01/28/2016    . potassium chloride SA (K-DUR,KLOR-CON) 20 MEQ tablet Take 1 tablet (20 mEq total) by mouth daily. 30 tablet  3  . tiotropium (SPIRIVA) 18 MCG inhalation capsule Place 1 capsule (18 mcg total) into inhaler and inhale daily. 90 capsule 3   No current facility-administered medications on file prior to visit.    Past Medical History  Diagnosis Date  . Hypopotassemia     PMH of  . Anemia     iron defficiency  . Muscle pain   . Atrial fibrillation or flutter     s/p RFCA 7/08;   s/p DCCV in past;   previously on amiodarone;  amio stopped due to lung toxicity  . CAD (coronary artery disease)     s/p NSTEMI tx with BMS to OM1 3/08;  cath 3/08: pOM 99% tx with PCI, pLAD 20%, ? mod stenosis at the AM  . HLD (hyperlipidemia)   . HTN (hypertension)  essential nos  . Dystrophy, corneal stromal   . Chronic diastolic heart failure ([place])     echo 11/11:  EF 55-60%, severe LVH, mod LAE, mild MR, mildly increased PASP  . Osteoporosis   . Heart murmur   . Anginal pain ([place])   . Myocardial infarction ([place]) 2008  . COPD (chronic obstructive pulmonary disease) ([place])   . Asthma   . Pneumonia 05/27/2015  . OSA on CPAP   . Type II diabetes mellitus ([facility])     Dr Loanne Drilling  . Seasonal allergies   . Degenerative joint disease   . Arthritis     "knees, feet, hands; joints" (05/27/2015)    Past Surgical History  Procedure Laterality Date  . Cholecystectomy    . Knee cartilage surgery Right   . Eye surgery    . A flutter ablation      Dr Lovena Le  . Gravid 2 para 2    . Colonoscopy  6/07    2 polyps Dr. Kinnie Feil in [place]  . Coronary angioplasty with stent placement  02/2007    BMS w/Dr Lovena Le  . Colonoscopy with polypectomy       X 2; [facility]  . Left and right heart catheterization with coronary angiogram N/A 05/07/2014    Procedure: LEFT AND RIGHT HEART CATHETERIZATION WITH CORONARY ANGIOGRAM;  Surgeon: Peter M Martinique, MD;  Location: [facility] CATH LAB;  Service: Cardiovascular;  Laterality: N/A;  . Cataract extraction w/ intraocular lens  implant, bilateral Bilateral   . Corneal transplant Bilateral      Social History   Social History  . Marital Status: Widowed    Spouse Name: N/A  . Number of Children: N/A  . Years of Education: N/A   Occupational History  . Teacher    Social History Main Topics  . Smoking status: Former Smoker -- 0.25 packs/day for 18 years    Types: Cigarettes    Quit date: 12/27/1968  . Smokeless tobacco: Never Used     Comment: smoked [place], up to 1 pp week  . Alcohol Use: No  . Drug Use: No  . Sexual Activity: Yes   Other Topics Concern  . None   Social History Sports administrator. Widowed. Rarely drinks cafeine.     Family History  Problem Relation Age of Onset  . Diabetes Mother   . Hypertension Mother   . Transient ischemic attack Mother   . Heart attack Father 47  . Arthritis Father   . Breast cancer Maternal Aunt   . Arthritis Maternal Aunt   . Arthritis Mother   . Hypertension Father     Review of Systems  Constitutional: Negative for fever, chills, appetite change and unexpected weight change.  HENT: Positive for hearing loss.   Eyes: Negative for visual disturbance.  Respiratory: Positive for shortness of breath (with moderate exertion only, not with exercise). Negative for cough and wheezing.   Cardiovascular: Positive for palpitations (rare, transient) and leg swelling (mild). Negative for chest pain.  Gastrointestinal: Positive for nausea (on occasion with too much tylenol) and constipation. Negative for abdominal pain and blood in stool.       No GERD  Genitourinary: Negative for dysuria and hematuria.  Musculoskeletal: Negative for myalgias, back pain and arthralgias.  Skin: Negative for rash.  Neurological: Negative for dizziness, light-headedness, numbness and headaches.  Psychiatric/Behavioral: Negative for sleep disturbance and dysphoric mood. The patient is not nervous/anxious.        Objective:  Filed Vitals:   02/02/16 0837  BP: 150/66  Pulse: 69  Temp: 98.2 F (36.8 C)  Resp: 16   Filed Weights    02/02/16 0837  Weight: 181 lb (82.101 kg)   Body mass index is 32.07 kg/(m^2).   Physical Exam Constitutional: She appears well-developed and well-nourished. No distress.  HENT:  Head: Normocephalic and atraumatic.  Right Ear: External ear normal. Normal ear canal and TM Left Ear: External ear normal.  Normal ear canal and TM Mouth/Throat: Oropharynx is clear and moist.  Normal bilateral ear canals and tympanic membranes  Eyes: Conjunctivae and EOM are normal.  Neck: Neck supple. No tracheal deviation present. No thyromegaly present.  No carotid bruit  Cardiovascular: Normal rate, regular rhythm and normal heart sounds.   No murmur heard.  No edema. Pulmonary/Chest: Effort normal and breath sounds normal. No respiratory distress. She has no wheezes. She has no rales.  Abdominal: Soft. She exhibits no distension. There is no tenderness.  Lymphadenopathy: She has no cervical adenopathy.  Skin: Skin is warm and dry. She is not diaphoretic.  Psychiatric: She has a normal mood and affect. Her behavior is normal.         Assessment & Plan:   Wellness Exam Immunizations - discussed shingles vaccine and she will call her insurance company, other vaccines up to date Colonoscopy  - not needed at this age 79 has one scheduled -  Will order it again to see if it can be done sooner dexa - she declined Eye exam up to date Hearing loss - mild, does not want further evaluation at this time Memory concerns/difficulties - no concerning memory issues - some of her recall difficulty is likely WNL - we discussed this at length.  Her daughter is here with her and denies any significant memory concerns.  Independent of ADLs Advanced directive discussed    Physical Exam: Screening blood work ordered Immunizations - discussed shingles vaccine and she will call her insurance company, other vaccines up to date EKG done in the last year, not indicated today Colonoscopy - not needed at  this age 71 - ordered dexa - she declined Exercise - regular and she will continue Skin - no concerns Eye exam up to date Substance abuse - none  See Problem List for Assessment and Plan of chronic medical problems.

## 2016-02-02 NOTE — Progress Notes (Signed)
Pre visit review using our clinic review tool, if applicable. No additional management support is needed unless otherwise documented below in the visit note. 

## 2016-02-02 NOTE — Assessment & Plan Note (Signed)
Following with Dr. Loanne Drilling Continue current insulin dose - will check a1c Continue regular exercise Work on weight loss

## 2016-02-02 NOTE — Assessment & Plan Note (Signed)
Check lipid panel  

## 2016-02-02 NOTE — Patient Instructions (Addendum)
[name] , Thank you for taking time to come for your Medicare Wellness Visit. I appreciate your ongoing commitment to your health goals. Please review the following plan we discussed and let me know if I can assist you in the future.   These are the goals we discussed: Goals    None      This is a list of the screening recommended for you and due dates:  Health Maintenance  Topic Date Due  . Eye exam for diabetics  11/12/1941  . Shingles Vaccine  11/11/1993  . DEXA scan (bone density measurement)  11/11/1998  . Urine Protein Check  02/07/2016  . Pneumonia vaccines (2 of 2 - PCV13) 05/30/2016  . Hemoglobin A1C  06/07/2016  . Flu Shot  07/27/2016  . Complete foot exam   12/07/2016  . Tetanus Vaccine  06/15/2020    We have reviewed your prior records including labs and tests today.  Test(s) ordered today. Your results will be released to [place] (or called to you) after review, usually within 72hours after test completion. If any changes need to be made, you will be notified at that same time.  All other Health Maintenance issues reviewed.   All recommended immunizations and age-appropriate screenings are up-to-date.  No immunizations administered today.   Medications reviewed and updated.  No changes recommended at this time.    Health Maintenance, Female Adopting a healthy lifestyle and getting preventive care can go a long way to promote health and wellness. Talk with your health care provider about what schedule of regular examinations is right for you. This is a good chance for you to check in with your provider about disease prevention and staying healthy. In between checkups, there are plenty of things you can do on your own. Experts have done a lot of research about which lifestyle changes and preventive measures are most likely to keep you healthy. Ask your health care provider for more information. WEIGHT AND DIET  Eat a healthy diet  Be sure to include plenty of  vegetables, fruits, low-fat dairy products, and lean protein.  Do not eat a lot of foods high in solid fats, added sugars, or salt.  Get regular exercise. This is one of the most important things you can do for your health.  Most adults should exercise for at least 150 minutes each week. The exercise should increase your heart rate and make you sweat (moderate-intensity exercise).  Most adults should also do strengthening exercises at least twice a week. This is in addition to the moderate-intensity exercise.  Maintain a healthy weight  Body mass index (BMI) is a measurement that can be used to identify possible weight problems. It estimates body fat based on height and weight. Your health care provider can help determine your BMI and help you achieve or maintain a healthy weight.  For females 42 years of age and older:   A BMI below 18.5 is considered underweight.  A BMI of 18.5 to 24.9 is normal.  A BMI of 25 to 29.9 is considered overweight.  A BMI of 30 and above is considered obese.  Watch levels of cholesterol and blood lipids  You should start having your blood tested for lipids and cholesterol at 80 years of age, then have this test every 5 years.  You may need to have your cholesterol levels checked more often if:  Your lipid or cholesterol levels are high.  You are older than 80 years of age.  You  are at high risk for heart disease.  CANCER SCREENING   Lung Cancer  Lung cancer screening is recommended for adults 48-48 years old who are at high risk for lung cancer because of a history of smoking.  A yearly low-dose CT scan of the lungs is recommended for people who:  Currently smoke.  Have quit within the past 15 years.  Have at least a 30-pack-year history of smoking. A pack year is smoking an average of one pack of cigarettes a day for 1 year.  Yearly screening should continue until it has been 15 years since you quit.  Yearly screening should stop if  you develop a health problem that would prevent you from having lung cancer treatment.  Breast Cancer  Practice breast self-awareness. This means understanding how your breasts normally appear and feel.  It also means doing regular breast self-exams. Let your health care provider know about any changes, no matter how small.  If you are in your 20s or 30s, you should have a clinical breast exam (CBE) by a health care provider every 1-3 years as part of a regular health exam.  If you are 18 or older, have a CBE every year. Also consider having a breast X-ray (mammogram) every year.  If you have a family history of breast cancer, talk to your health care provider about genetic screening.  If you are at high risk for breast cancer, talk to your health care provider about having an MRI and a mammogram every year.  Breast cancer gene (BRCA) assessment is recommended for women who have family members with BRCA-related cancers. BRCA-related cancers include:  Breast.  Ovarian.  Tubal.  Peritoneal cancers.  Results of the assessment will determine the need for genetic counseling and BRCA1 and BRCA2 testing. Cervical Cancer Your health care provider may recommend that you be screened regularly for cancer of the pelvic organs (ovaries, uterus, and vagina). This screening involves a pelvic examination, including checking for microscopic changes to the surface of your cervix (Pap test). You may be encouraged to have this screening done every 3 years, beginning at age 87.  For women ages 44-65, health care providers may recommend pelvic exams and Pap testing every 3 years, or they may recommend the Pap and pelvic exam, combined with testing for human papilloma virus (HPV), every 5 years. Some types of HPV increase your risk of cervical cancer. Testing for HPV may also be done on women of any age with unclear Pap test results.  Other health care providers may not recommend any screening for  nonpregnant women who are considered low risk for pelvic cancer and who do not have symptoms. Ask your health care provider if a screening pelvic exam is right for you.  If you have had past treatment for cervical cancer or a condition that could lead to cancer, you need Pap tests and screening for cancer for at least 20 years after your treatment. If Pap tests have been discontinued, your risk factors (such as having a new sexual partner) need to be reassessed to determine if screening should resume. Some women have medical problems that increase the chance of getting cervical cancer. In these cases, your health care provider may recommend more frequent screening and Pap tests. Colorectal Cancer  This type of cancer can be detected and often prevented.  Routine colorectal cancer screening usually begins at 80 years of age and continues through 80 years of age.  Your health care provider may recommend screening at  an earlier age if you have risk factors for colon cancer.  Your health care provider may also recommend using home test kits to check for hidden blood in the stool.  A small camera at the end of a tube can be used to examine your colon directly (sigmoidoscopy or colonoscopy). This is done to check for the earliest forms of colorectal cancer.  Routine screening usually begins at age 92.  Direct examination of the colon should be repeated every 5-10 years through 80 years of age. However, you may need to be screened more often if early forms of precancerous polyps or small growths are found. Skin Cancer  Check your skin from head to toe regularly.  Tell your health care provider about any new moles or changes in moles, especially if there is a change in a mole's shape or color.  Also tell your health care provider if you have a mole that is larger than the size of a pencil eraser.  Always use sunscreen. Apply sunscreen liberally and repeatedly throughout the day.  Protect yourself  by wearing long sleeves, pants, a wide-brimmed hat, and sunglasses whenever you are outside. HEART DISEASE, DIABETES, AND HIGH BLOOD PRESSURE   High blood pressure causes heart disease and increases the risk of stroke. High blood pressure is more likely to develop in:  People who have blood pressure in the high end of the normal range (130-139/85-89 mm Hg).  People who are overweight or obese.  People who are African American.  If you are 62-26 years of age, have your blood pressure checked every 3-5 years. If you are 72 years of age or older, have your blood pressure checked every year. You should have your blood pressure measured twice--once when you are at a hospital or clinic, and once when you are not at a hospital or clinic. Record the average of the two measurements. To check your blood pressure when you are not at a hospital or clinic, you can use:  An automated blood pressure machine at a pharmacy.  A home blood pressure monitor.  If you are between 27 years and 68 years old, ask your health care provider if you should take aspirin to prevent strokes.  Have regular diabetes screenings. This involves taking a blood sample to check your fasting blood sugar level.  If you are at a normal weight and have a low risk for diabetes, have this test once every three years after 80 years of age.  If you are overweight and have a high risk for diabetes, consider being tested at a younger age or more often. PREVENTING INFECTION  Hepatitis B  If you have a higher risk for hepatitis B, you should be screened for this virus. You are considered at high risk for hepatitis B if:  You were born in a country where hepatitis B is common. Ask your health care provider which countries are considered high risk.  Your parents were born in a high-risk country, and you have not been immunized against hepatitis B (hepatitis B vaccine).  You have HIV or AIDS.  You use needles to inject street  drugs.  You live with someone who has hepatitis B.  You have had sex with someone who has hepatitis B.  You get hemodialysis treatment.  You take certain medicines for conditions, including cancer, organ transplantation, and autoimmune conditions. Hepatitis C  Blood testing is recommended for:  Everyone born from [birth year] through 1965.  Anyone with known risk factors for hepatitis  C. Sexually transmitted infections (STIs)  You should be screened for sexually transmitted infections (STIs) including gonorrhea and chlamydia if:  You are sexually active and are younger than 80 years of age.  You are older than 80 years of age and your health care provider tells you that you are at risk for this type of infection.  Your sexual activity has changed since you were last screened and you are at an increased risk for chlamydia or gonorrhea. Ask your health care provider if you are at risk.  If you do not have HIV, but are at risk, it may be recommended that you take a prescription medicine daily to prevent HIV infection. This is called pre-exposure prophylaxis (PrEP). You are considered at risk if:  You are sexually active and do not regularly use condoms or know the HIV status of your partner(s).  You take drugs by injection.  You are sexually active with a partner who has HIV. Talk with your health care provider about whether you are at high risk of being infected with HIV. If you choose to begin PrEP, you should first be tested for HIV. You should then be tested every 3 months for as long as you are taking PrEP.  PREGNANCY   If you are premenopausal and you may become pregnant, ask your health care provider about preconception counseling.  If you may become pregnant, take 400 to 800 micrograms (mcg) of folic acid every day.  If you want to prevent pregnancy, talk to your health care provider about birth control (contraception). OSTEOPOROSIS AND MENOPAUSE   Osteoporosis is a disease in  which the bones lose minerals and strength with aging. This can result in serious bone fractures. Your risk for osteoporosis can be identified using a bone density scan.  If you are 53 years of age or older, or if you are at risk for osteoporosis and fractures, ask your health care provider if you should be screened.  Ask your health care provider whether you should take a calcium or vitamin D supplement to lower your risk for osteoporosis.  Menopause may have certain physical symptoms and risks.  Hormone replacement therapy may reduce some of these symptoms and risks. Talk to your health care provider about whether hormone replacement therapy is right for you.  HOME CARE INSTRUCTIONS   Schedule regular health, dental, and eye exams.  Stay current with your immunizations.   Do not use any tobacco products including cigarettes, chewing tobacco, or electronic cigarettes.  If you are pregnant, do not drink alcohol.  If you are breastfeeding, limit how much and how often you drink alcohol.  Limit alcohol intake to no more than 1 drink per day for nonpregnant women. One drink equals 12 ounces of beer, 5 ounces of wine, or 1 ounces of hard liquor.  Do not use street drugs.  Do not share needles.  Ask your health care provider for help if you need support or information about quitting drugs.  Tell your health care provider if you often feel depressed.  Tell your health care provider if you have ever been abused or do not feel safe at home.   This information is not intended to replace advice given to you by your health care provider. Make sure you discuss any questions you have with your health care provider.   Document Released: 06/28/2011 Document Revised: 01/03/2015 Document Reviewed: 11/14/2013 Elsevier Interactive Patient Education [employer].

## 2016-02-02 NOTE — Assessment & Plan Note (Signed)
Asymptomatic Continue current medications and regular exercise Work on weight loss

## 2016-02-04 ENCOUNTER — Other Ambulatory Visit: Payer: Self-pay | Admitting: Internal Medicine

## 2016-02-04 DIAGNOSIS — Z1231 Encounter for screening mammogram for malignant neoplasm of breast: Secondary | ICD-10-CM

## 2016-02-13 ENCOUNTER — Telehealth: Payer: Self-pay | Admitting: Endocrinology

## 2016-02-13 MED ORDER — ONETOUCH ULTRASOFT LANCETS MISC: Status: DC

## 2016-02-13 MED ORDER — GLUCOSE BLOOD VI STRP: ORAL_STRIP | Status: DC

## 2016-02-13 NOTE — Telephone Encounter (Signed)
Rx submitted per pt's request.  

## 2016-02-13 NOTE — Telephone Encounter (Signed)
Patient need a refill of Lancets and test strips for the one touch verio flex send to [facility].

## 2016-02-16 ENCOUNTER — Ambulatory Visit
Admission: RE | Admit: 2016-02-16 | Discharge: 2016-02-16 | Disposition: A | Discharge status: Home / Self Care | Payer: Medicare Other | Source: Ambulatory Visit | Attending: Internal Medicine | Admitting: Internal Medicine

## 2016-02-16 DIAGNOSIS — Z1231 Encounter for screening mammogram for malignant neoplasm of breast: Secondary | ICD-10-CM | POA: Diagnosis not present

## 2016-02-25 DIAGNOSIS — H1851 Endothelial corneal dystrophy: Secondary | ICD-10-CM | POA: Diagnosis not present

## 2016-02-25 DIAGNOSIS — H18423 Band keratopathy, bilateral: Secondary | ICD-10-CM | POA: Diagnosis not present

## 2016-02-25 DIAGNOSIS — J9691 Respiratory failure, unspecified with hypoxia: Secondary | ICD-10-CM | POA: Diagnosis not present

## 2016-02-25 DIAGNOSIS — I509 Heart failure, unspecified: Secondary | ICD-10-CM | POA: Diagnosis not present

## 2016-02-26 DIAGNOSIS — G4733 Obstructive sleep apnea (adult) (pediatric): Secondary | ICD-10-CM | POA: Diagnosis not present

## 2016-03-02 ENCOUNTER — Encounter: Payer: Self-pay | Admitting: Internal Medicine

## 2016-03-04 ENCOUNTER — Other Ambulatory Visit (INDEPENDENT_AMBULATORY_CARE_PROVIDER_SITE_OTHER): Payer: Medicare Other

## 2016-03-04 DIAGNOSIS — E559 Vitamin D deficiency, unspecified: Secondary | ICD-10-CM | POA: Diagnosis not present

## 2016-03-04 DIAGNOSIS — D510 Vitamin B12 deficiency anemia due to intrinsic factor deficiency: Secondary | ICD-10-CM | POA: Diagnosis not present

## 2016-03-04 DIAGNOSIS — E1165 Type 2 diabetes mellitus with hyperglycemia: Secondary | ICD-10-CM

## 2016-03-04 DIAGNOSIS — E782 Mixed hyperlipidemia: Secondary | ICD-10-CM | POA: Diagnosis not present

## 2016-03-04 DIAGNOSIS — E0590 Thyrotoxicosis, unspecified without thyrotoxic crisis or storm: Secondary | ICD-10-CM

## 2016-03-04 DIAGNOSIS — Z0000 Encounter for general adult medical examination without abnormal findings: Secondary | ICD-10-CM

## 2016-03-04 DIAGNOSIS — IMO0002 Reserved for concepts with insufficient information to code with codable children: Secondary | ICD-10-CM

## 2016-03-04 DIAGNOSIS — Z794 Long term (current) use of insulin: Secondary | ICD-10-CM

## 2016-03-04 LAB — CBC WITH DIFFERENTIAL/PLATELET
BASOS ABS: 0 10*3/uL (ref 0.0–0.1)
Basophils Relative: 0.7 % (ref 0.0–3.0)
Eosinophils Absolute: 0.7 10*3/uL (ref 0.0–0.7)
Eosinophils Relative: 10.8 % — ABNORMAL HIGH (ref 0.0–5.0)
HCT: 44.6 % (ref 36.0–46.0)
Hemoglobin: 14.7 g/dL (ref 12.0–15.0)
LYMPHS ABS: 1.5 10*3/uL (ref 0.7–4.0)
LYMPHS PCT: 24.6 % (ref 12.0–46.0)
MCHC: 33 g/dL (ref 30.0–36.0)
MCV: 90.9 fl (ref 78.0–100.0)
MONOS PCT: 12.1 % — AB (ref 3.0–12.0)
Monocytes Absolute: 0.7 10*3/uL (ref 0.1–1.0)
NEUTROS PCT: 51.8 % (ref 43.0–77.0)
Neutro Abs: 3.2 10*3/uL (ref 1.4–7.7)
Platelets: 201 10*3/uL (ref 150.0–400.0)
RBC: 4.91 Mil/uL (ref 3.87–5.11)
RDW: 14 % (ref 11.5–15.5)
WBC: 6.1 10*3/uL (ref 4.0–10.5)

## 2016-03-04 LAB — COMPREHENSIVE METABOLIC PANEL
ALBUMIN: 4.1 g/dL (ref 3.5–5.2)
ALT: 15 U/L (ref 0–35)
AST: 20 U/L (ref 0–37)
Alkaline Phosphatase: 96 U/L (ref 39–117)
BILIRUBIN TOTAL: 0.4 mg/dL (ref 0.2–1.2)
BUN: 25 mg/dL — ABNORMAL HIGH (ref 6–23)
CHLORIDE: 102 meq/L (ref 96–112)
CO2: 29 meq/L (ref 19–32)
CREATININE: 1.03 mg/dL (ref 0.40–1.20)
Calcium: 10 mg/dL (ref 8.4–10.5)
GFR: 65.93 mL/min (ref >60.00)
GLUCOSE: 158 mg/dL — AB (ref 70–99)
Potassium: 4.2 mEq/L (ref 3.5–5.1)
SODIUM: 140 meq/L (ref 135–145)
TOTAL PROTEIN: 7.9 g/dL (ref 6.0–8.3)

## 2016-03-04 LAB — LIPID PANEL
Cholesterol: 317 mg/dL — ABNORMAL HIGH (ref 0–200)
HDL: 51.7 mg/dL
LDL Cholesterol: 241 mg/dL — ABNORMAL HIGH (ref 0–99)
NonHDL: 265.68
Total CHOL/HDL Ratio: 6
Triglycerides: 122 mg/dL (ref 0.0–149.0)
VLDL: 24.4 mg/dL (ref 0.0–40.0)

## 2016-03-04 LAB — VITAMIN B12: Vitamin B-12: 603 pg/mL (ref 211–911)

## 2016-03-04 LAB — T4, FREE: Free T4: 0.89 ng/dL (ref 0.60–1.60)

## 2016-03-04 LAB — TSH: TSH: 4.35 u[IU]/mL (ref 0.35–4.50)

## 2016-03-04 LAB — HEMOGLOBIN A1C: Hgb A1c MFr Bld: 7.8 % — ABNORMAL HIGH (ref 4.6–6.5)

## 2016-03-07 DIAGNOSIS — E119 Type 2 diabetes mellitus without complications: Secondary | ICD-10-CM | POA: Insufficient documentation

## 2016-03-07 DIAGNOSIS — E1122 Type 2 diabetes mellitus with diabetic chronic kidney disease: Secondary | ICD-10-CM | POA: Insufficient documentation

## 2016-03-07 NOTE — Patient Instructions (Addendum)
check your blood sugar twice a day.  vary the time of day when you check, between before the 3 meals, and at bedtime.  also check if you have symptoms of your blood sugar being too high or too low.  please keep a record of the readings and bring it to your next appointment here.  please call us sooner if your blood sugar goes below 70, or if you have a lot of readings over 200.   Please reduce the methimazole to twice a week.   On this type of insulin schedule, you should eat meals on a regular schedule.  If a meal is missed or significantly delayed, your blood sugar could go low.  Please continue the same insulin.  However, if you are going to be active, take just 25 units.   Please come back for a follow-up appointment in 4 months.

## 2016-03-07 NOTE — Progress Notes (Signed)
Subjective:    Patient ID: [name], female    DOB: 03/08/1931, 82 y.o.   MRN: [id number]  HPI Pt returns for f/u of diabetes mellitus: DM type: Insulin-requiring type 2.  Dx'ed: [id number] Complications: CAD, polyneuropathy, and renal insufficiency.   Therapy: insulin since 2007.  GDM: never DKA: never Severe hypoglycemia: never.  Pancreatitis: never.  Other: she changed to qd insulin, after poor results with multiple daily injections.   Interval history: she brings a record of her cbg's which i have reviewed today.  It varies from 59-200's.  She says it is mildly low when she misses a meal--this happens approx twice per month.   Pt also returns for f/u of hyperthyroidism (dx'ed 2012; [place] then showed multinodular goiter; tapazole was chose as rx, due to low uptake on nuc med scan).  Denies fever.   Past Medical History  Diagnosis Date  . Hypopotassemia     PMH of  . Anemia     iron defficiency  . Muscle pain   . Atrial fibrillation or flutter     s/p RFCA 7/08;   s/p DCCV in past;   previously on amiodarone;  amio stopped due to lung toxicity  . CAD (coronary artery disease)     s/p NSTEMI tx with BMS to OM1 3/08;  cath 3/08: pOM 99% tx with PCI, pLAD 20%, ? mod stenosis at the AM  . HLD (hyperlipidemia)   . HTN (hypertension)     essential nos  . Dystrophy, corneal stromal   . Chronic diastolic heart failure ([place])     echo 11/11:  EF 55-60%, severe LVH, mod LAE, mild MR, mildly increased PASP  . Osteoporosis   . Heart murmur   . Anginal pain ([place])   . Myocardial infarction ([place]) 2008  . COPD (chronic obstructive pulmonary disease) ([place])   . Asthma   . Pneumonia 05/27/2015  . OSA on CPAP   . Type II diabetes mellitus ([facility])     Dr Loanne Drilling  . Seasonal allergies   . Degenerative joint disease   . Arthritis     "knees, feet, hands; joints" (05/27/2015)    Past Surgical History  Procedure Laterality Date  . Cholecystectomy    . Knee cartilage surgery Right   . Eye  surgery    . A flutter ablation      Dr Lovena Le  . Gravid 2 para 2    . Colonoscopy  6/07    2 polyps Dr. Kinnie Feil in [place]  . Coronary angioplasty with stent placement  02/2007    BMS w/Dr Lovena Le  . Colonoscopy with polypectomy       X 2; [facility]  . Left and right heart catheterization with coronary angiogram N/A 05/07/2014    Procedure: LEFT AND RIGHT HEART CATHETERIZATION WITH CORONARY ANGIOGRAM;  Surgeon: Peter M Martinique, MD;  [facility];  Service: Cardiovascular;  Laterality: N/A;  . Cataract extraction w/ intraocular lens  implant, bilateral Bilateral   . Corneal transplant Bilateral     Social History   Social History  . Marital Status: Widowed    Spouse Name: N/A  . Number of Children: N/A  . Years of Education: N/A   Occupational History  . Teacher    Social History Main Topics  . Smoking status: Former Smoker -- 0.25 packs/day for 18 years    Types: Cigarettes    Quit date: 12/27/1968  . Smokeless tobacco: Never Used     Comment:  smoked [place], up to 1 pp week  . Alcohol Use: No  . Drug Use: No  . Sexual Activity: Yes   Other Topics Concern  . Not on file   Social History Narrative   Teacher. Widowed. Rarely drinks cafeine.     Current Outpatient Prescriptions on File Prior to Visit  Medication Sig Dispense Refill  . apixaban (ELIQUIS) 5 MG TABS tablet Take 1 tablet (5 mg total) by mouth 2 (two) times daily. 60 tablet 3  . furosemide (LASIX) 40 MG tablet Take 2 tablets (80 mg total) by mouth daily. 180 tablet 3  . glucose blood (ONE TOUCH TEST STRIPS) test strip Use to check blood sugar 2 times per day. Dx Code E11.9 (ONE TOUCH VERIO) 200 each 2  . insulin NPH Human (HUMULIN N,NOVOLIN N) 100 UNIT/ML injection Inject 35 Units into the skin.    . Lancets (ONETOUCH ULTRASOFT) lancets Use to check blood sugar 2 times per day. Dx Code E11.9 200 each 2  . nitroGLYCERIN (NITROSTAT) 0.4 MG SL tablet Place 0.4 mg under the tongue every 5 (five) minutes as  needed for chest pain. Reported on 01/28/2016    . tiotropium (SPIRIVA) 18 MCG inhalation capsule Place 1 capsule (18 mcg total) into inhaler and inhale daily. 90 capsule 3  . TURMERIC PO Take by mouth.    . Vitamin D, Cholecalciferol, 1000 units TABS Take 2,000 Units by mouth.     No current facility-administered medications on file prior to visit.    Allergies  Allergen Reactions  . Amlodipine Besylate Other (See Comments)    REACTION: tingling in lips & gum edema  . Levaquin [Levofloxacin] Shortness Of Breath and Swelling    angioedema  . Lobster [Shellfish Allergy] Other (See Comments)    angioedema  . Penicillins Rash  . Valsartan Other (See Comments)    REACTION: angioedema  . Codeine Other (See Comments)    Mental status changes  . Tramadol Hcl Nausea And Vomiting    Family History  Problem Relation Age of Onset  . Diabetes Mother   . Hypertension Mother   . Transient ischemic attack Mother   . Heart attack Father 77  . Arthritis Father   . Breast cancer Maternal Aunt   . Arthritis Maternal Aunt   . Arthritis Mother   . Hypertension Father     BP 128/64 mmHg  Pulse 81  Temp(Src) 98.1 F (36.7 C) (Oral)  Resp 12  Wt 180 lb 6.4 oz (81.829 kg)  SpO2 94%  Review of Systems Denies LOC and weight change.      Objective:   Physical Exam VITAL SIGNS:  See vs page GENERAL: no distress Pulses: dorsalis pedis intact bilat.   MSK: no deformity of the feet CV: no leg edema Skin:  no ulcer on the feet.  normal color and temp on the feet. Neuro: sensation is intact to touch on the feet   Lab Results  Component Value Date   TSH 4.35 03/04/2016   Lab Results  Component Value Date   HGBA1C 7.8* 03/04/2016      Assessment & Plan:  DM: slightly worse.   Hypoglycemia: she needs to vary insulin according to her activity level.   Hyperthyroidism: well-controlled, but she needs to reduce tapazole to avoid hypothyroidism.    Patient is advised the  following: Patient Instructions  check your blood sugar twice a day.  vary the time of day when you check, between before the 3 meals, and  at bedtime.  also check if you have symptoms of your blood sugar being too high or too low.  please keep a record of the readings and bring it to your next appointment here.  please call us sooner if your blood sugar goes below 70, or if you have a lot of readings over 200.   Please reduce the methimazole to twice a week.   On this type of insulin schedule, you should eat meals on a regular schedule.  If a meal is missed or significantly delayed, your blood sugar could go low.  Please continue the same insulin.  However, if you are going to be active, take just 25 units.   Please come back for a follow-up appointment in 4 months.

## 2016-03-08 ENCOUNTER — Other Ambulatory Visit (HOSPITAL_COMMUNITY): Payer: Self-pay | Admitting: Internal Medicine

## 2016-03-08 ENCOUNTER — Ambulatory Visit (INDEPENDENT_AMBULATORY_CARE_PROVIDER_SITE_OTHER): Payer: Medicare Other | Admitting: Endocrinology

## 2016-03-08 ENCOUNTER — Encounter: Payer: Self-pay | Admitting: Endocrinology

## 2016-03-08 ENCOUNTER — Other Ambulatory Visit (HOSPITAL_COMMUNITY): Payer: Self-pay | Admitting: *Deleted

## 2016-03-08 ENCOUNTER — Telehealth: Payer: Self-pay | Admitting: *Deleted

## 2016-03-08 VITALS — BP 128/64 | HR 81 | Temp 98.1°F | Resp 12 | Wt 180.4 lb

## 2016-03-08 DIAGNOSIS — N183 Chronic kidney disease, stage 3 unspecified: Secondary | ICD-10-CM

## 2016-03-08 DIAGNOSIS — E1122 Type 2 diabetes mellitus with diabetic chronic kidney disease: Secondary | ICD-10-CM

## 2016-03-08 DIAGNOSIS — Z794 Long term (current) use of insulin: Secondary | ICD-10-CM | POA: Diagnosis not present

## 2016-03-08 LAB — VITAMIN D 1,25 DIHYDROXY
VITAMIN D 1, 25 (OH) TOTAL: 38 pg/mL (ref 18–72)
VITAMIN D3 1, 25 (OH): 38 pg/mL

## 2016-03-08 MED ORDER — CARVEDILOL 12.5 MG PO TABS: 12.5000 mg | ORAL_TABLET | Freq: Two times a day (BID) | ORAL | Status: DC

## 2016-03-08 NOTE — Telephone Encounter (Signed)
Let her know her vitamin D is still pending.  Her blood counts, kidney function, liver tests, b12 level, thyroid function are all normal.  Her a1c is 7.8 - which is not too bad.  I would ideally like it a little lower but Dr Loanne Drilling is managing this.  Her LDL or bad cholesterol is 241!  It was 146 last year.  She should be on a statin, such as lipitor - if she agrees I will send a prescription to her pharmacy.  If she starts the medication she needs to have repeat blood work in 6 weeks to recheck her cholesterol and liver tests (let me know if she agrees to start the medication )

## 2016-03-08 NOTE — Telephone Encounter (Signed)
Left msg on triage stating she received her test results from MD. Will be leaving going out of town this weekend and wanting to know if there is anything she need to be doing differently...[name]

## 2016-03-09 ENCOUNTER — Encounter: Payer: Self-pay | Admitting: Internal Medicine

## 2016-03-09 NOTE — Telephone Encounter (Signed)
Notified pt with md response. Pt states she use to take crestor & liptor at one time, and Dr. Linna Darner took her off, not sure why, but she is wanting to try to lower cholesterol herself with diet & exercise, and if results haven't went down then she will consider Lipitor. [facility] Also she is ? About the Eosinophils results is that something that she need to be concern about...[name]

## 2016-03-09 NOTE — Telephone Encounter (Signed)
The high eosinophils usually represent allergies or an allergic process

## 2016-03-09 NOTE — Telephone Encounter (Signed)
Called pt no answer LMOM w/MD response../lmb 

## 2016-03-09 NOTE — Telephone Encounter (Signed)
Please advise 

## 2016-03-22 ENCOUNTER — Encounter: Payer: Self-pay | Admitting: Internal Medicine

## 2016-04-14 ENCOUNTER — Telehealth: Payer: Self-pay | Admitting: Internal Medicine

## 2016-04-14 DIAGNOSIS — M1711 Unilateral primary osteoarthritis, right knee: Secondary | ICD-10-CM

## 2016-04-14 NOTE — Telephone Encounter (Signed)
Spoke to pt. Entered referral for Dr. Mayer Camel @ [facility].

## 2016-04-14 NOTE — Telephone Encounter (Signed)
Would like to ask question about a referral.

## 2016-04-22 ENCOUNTER — Telehealth: Payer: Self-pay | Admitting: Internal Medicine

## 2016-04-22 DIAGNOSIS — M1712 Unilateral primary osteoarthritis, left knee: Secondary | ICD-10-CM | POA: Diagnosis not present

## 2016-04-22 DIAGNOSIS — M1711 Unilateral primary osteoarthritis, right knee: Secondary | ICD-10-CM | POA: Diagnosis not present

## 2016-04-22 MED ORDER — TIOTROPIUM BROMIDE MONOHYDRATE 18 MCG IN CAPS: 18.0000 ug | ORAL_CAPSULE | Freq: Every day | RESPIRATORY_TRACT | Status: DC

## 2016-04-22 NOTE — Telephone Encounter (Signed)
Patient is requesting script for spiriva to be sent [facility] on [address] in [place].  Patient does goes through the assistance program for this but it has lapsed and she will have to reapply.  Until then she would like a script sent to the pharmacy.

## 2016-04-23 ENCOUNTER — Telehealth: Payer: Self-pay | Admitting: Emergency Medicine

## 2016-04-23 ENCOUNTER — Encounter: Payer: Self-pay | Admitting: Pulmonary Disease

## 2016-04-23 ENCOUNTER — Ambulatory Visit (INDEPENDENT_AMBULATORY_CARE_PROVIDER_SITE_OTHER): Payer: Medicare Other | Admitting: Pulmonary Disease

## 2016-04-23 VITALS — BP 128/60 | HR 85 | Ht 63.0 in | Wt 177.4 lb

## 2016-04-23 DIAGNOSIS — G4733 Obstructive sleep apnea (adult) (pediatric): Secondary | ICD-10-CM

## 2016-04-23 DIAGNOSIS — J449 Chronic obstructive pulmonary disease, unspecified: Secondary | ICD-10-CM | POA: Diagnosis not present

## 2016-04-23 DIAGNOSIS — J432 Centrilobular emphysema: Secondary | ICD-10-CM

## 2016-04-23 DIAGNOSIS — Z9989 Dependence on other enabling machines and devices: Principal | ICD-10-CM

## 2016-04-23 NOTE — Patient Instructions (Signed)
Follow up in 6 months 

## 2016-04-23 NOTE — Telephone Encounter (Signed)
Received paperwork from pt for Drug assistance program for her Spiriva. Form has been signed by MD and sent back to pt via mail.

## 2016-04-23 NOTE — Progress Notes (Signed)
Current Outpatient Prescriptions on File Prior to Visit  Medication Sig  . apixaban (ELIQUIS) 5 MG TABS tablet Take 1 tablet (5 mg total) by mouth 2 (two) times daily.  . carvedilol (COREG) 12.5 MG tablet Take 1 tablet (12.5 mg total) by mouth 2 (two) times daily with a meal.  . furosemide (LASIX) 40 MG tablet Take 2 tablets (80 mg total) by mouth daily.  Marland Kitchen glucose blood (ONE TOUCH TEST STRIPS) test strip Use to check blood sugar 2 times per day. Dx Code E11.9 (ONE TOUCH VERIO)  . insulin NPH Human (HUMULIN N,NOVOLIN N) 100 UNIT/ML injection Inject 35 Units into the skin.  Marland Kitchen KLOR-CON M20 20 MEQ tablet TAKE ONE TABLET (20 MEQ TOTAL) BY MOUTH DAILY.  Marland Kitchen Lancets (ONETOUCH ULTRASOFT) lancets Use to check blood sugar 2 times per day. Dx Code E11.9  . methimazole (TAPAZOLE) 5 MG tablet Take 5 mg by mouth 2 (two) times a week.  . nitroGLYCERIN (NITROSTAT) 0.4 MG SL tablet Place 0.4 mg under the tongue every 5 (five) minutes as needed for chest pain. Reported on 01/28/2016  . tiotropium (SPIRIVA) 18 MCG inhalation capsule Place 1 capsule (18 mcg total) into inhaler and inhale daily.  . TURMERIC PO Take by mouth.  . Vitamin D, Cholecalciferol, 1000 units TABS Take 2,000 Units by mouth.   No current facility-administered medications on file prior to visit.     Chief Complaint  Patient presents with  . Follow-up    Former [facility] pt: Wears CPAP most nights. Denies problems with mask/pressure. DME: [facility]     Tests PFT 05/21/14 >> FEV1 1.37 (94%), FEV1% 66, TLC 4.69 (95%), DLCO 39% PSG 7/14//15 >> AHI 37 Echo 03/04/15 >> EF 60 to 65%, mod/severe LA dilation CT chest 05/24/15 >> mild centrilobular emphysema Auto CPAP 01/23/16 to 04/21/16 >> used on 34 of 90 nights with average 6 hrs 27 min.  Average AHI 4.8 with median CPAP 13 and 95 th percentile CPAP 14 cm H2O  Past medical hx A fib, CAD, HTN, HLD, Chronic diastolic dysfx, COPD, DM, Allergies  Past surgical hx, Allergies, Family hx, Social hx all  reviewed.  Vital Signs BP 128/60 mmHg  Pulse 85  Ht 5\' 3"  (1.6 m)  Wt 177 lb 6.4 oz (80.468 kg)  BMI 31.43 kg/m2  SpO2 94%  History of Present Illness [name] is a 82 y.o. female former smoker with obstructive sleep apnea.  She was previously seen by Dr. Gwenette Greet.  She feels that CPAP helps her sleep, and she feels better during the day after she uses CPAP.  She doesn't have any issue with her mask fit.  She has hx of COPD and has diffusion defect on previous PFT.  Previous CT chest showed centrilobular emphysema.  She uses spiriva daily.  She is not having cough, wheeze, sputum, or chest discomfort.  Her main problem is getting winded with activity.   Some of her activity limitation is also related to right knee pain.  She has been seen by Dr. Mayer Camel with orthopedics and being assessed for right knee replacement surgery.   Physical Exam  General - No distress ENT - No sinus tenderness, no oral exudate, no LAN, MP 3 Cardiac - s1s2 regular, no murmur Chest - No wheeze/rales/dullness Back - No focal tenderness Abd - Soft, non-tender Ext - No edema Neuro - Normal strength Skin - No rashes Psych - normal mood, and behavior   Assessment/Plan  COPD with emphysema. - continue spiriva  Obstructive sleep apnea. - continue auto CPAP - encouraged her to use CPAP whenever she is asleep  Right knee pain. - she is being assessed by Dr. Mayer Camel with orthopedics for knee replacement - while there are no pulmonary contra-indications for her to have surgery, she would be at increased risk of peri-operative pulmonary complications   Patient Instructions  Follow up in 6 months     Chesley Mires, MD [facility] Pager:  [phone] 04/23/2016, 3:59 PM

## 2016-05-04 ENCOUNTER — Telehealth: Payer: Self-pay | Admitting: Internal Medicine

## 2016-05-04 NOTE — Telephone Encounter (Signed)
States there was a application for assistance for medication through the drug company for 90 day supply of  spiriva.  States a script did not accompany the application.  Company is requesting this.  Please follow up with patient in regards.  Patient would also like to know if she can get the shingle vac?  Please leave vm if not available.

## 2016-05-05 MED ORDER — TIOTROPIUM BROMIDE MONOHYDRATE 18 MCG IN CAPS: 18.0000 ug | ORAL_CAPSULE | Freq: Every day | RESPIRATORY_TRACT | Status: DC

## 2016-05-05 NOTE — Telephone Encounter (Signed)
RX placed up front for pickup

## 2016-05-05 NOTE — Telephone Encounter (Signed)
Spoke with pt, pt needs a hard copy of the Spirva to send to Assistance program. She will call and find out from insurance comp to see if they cover the shingles vac.

## 2016-05-17 ENCOUNTER — Telehealth (HOSPITAL_COMMUNITY): Payer: Self-pay | Admitting: Vascular Surgery

## 2016-05-17 NOTE — Telephone Encounter (Signed)
Pt needs prescription eliquis

## 2016-05-18 ENCOUNTER — Other Ambulatory Visit (HOSPITAL_COMMUNITY): Payer: Self-pay | Admitting: Pharmacist

## 2016-05-18 MED ORDER — APIXABAN 5 MG PO TABS: 5.0000 mg | ORAL_TABLET | Freq: Two times a day (BID) | ORAL | Status: DC

## 2016-05-25 ENCOUNTER — Ambulatory Visit: Payer: Medicare Other | Admitting: Internal Medicine

## 2016-06-03 DIAGNOSIS — K1120 Sialoadenitis, unspecified: Secondary | ICD-10-CM | POA: Diagnosis not present

## 2016-06-03 DIAGNOSIS — K115 Sialolithiasis: Secondary | ICD-10-CM | POA: Diagnosis not present

## 2016-06-04 ENCOUNTER — Emergency Department (HOSPITAL_BASED_OUTPATIENT_CLINIC_OR_DEPARTMENT_OTHER): Admit: 2016-06-04 | Discharge: 2016-06-04 | Disposition: A | Discharge status: Home / Self Care | Payer: Medicare Other

## 2016-06-04 ENCOUNTER — Telehealth: Payer: Self-pay | Admitting: Internal Medicine

## 2016-06-04 ENCOUNTER — Emergency Department (HOSPITAL_COMMUNITY)
Admission: EM | Admit: 2016-06-04 | Discharge: 2016-06-04 | Disposition: A | Discharge status: Home / Self Care | Payer: Medicare Other | Attending: Emergency Medicine | Admitting: Emergency Medicine

## 2016-06-04 ENCOUNTER — Encounter (HOSPITAL_COMMUNITY): Payer: Self-pay

## 2016-06-04 ENCOUNTER — Emergency Department (HOSPITAL_COMMUNITY): Payer: Medicare Other

## 2016-06-04 DIAGNOSIS — I5032 Chronic diastolic (congestive) heart failure: Secondary | ICD-10-CM | POA: Insufficient documentation

## 2016-06-04 DIAGNOSIS — Z79899 Other long term (current) drug therapy: Secondary | ICD-10-CM | POA: Diagnosis not present

## 2016-06-04 DIAGNOSIS — I10 Essential (primary) hypertension: Secondary | ICD-10-CM | POA: Insufficient documentation

## 2016-06-04 DIAGNOSIS — Z880 Allergy status to penicillin: Secondary | ICD-10-CM | POA: Insufficient documentation

## 2016-06-04 DIAGNOSIS — M79605 Pain in left leg: Secondary | ICD-10-CM

## 2016-06-04 DIAGNOSIS — Z794 Long term (current) use of insulin: Secondary | ICD-10-CM | POA: Diagnosis not present

## 2016-06-04 DIAGNOSIS — Z8701 Personal history of pneumonia (recurrent): Secondary | ICD-10-CM | POA: Insufficient documentation

## 2016-06-04 DIAGNOSIS — Z9861 Coronary angioplasty status: Secondary | ICD-10-CM | POA: Insufficient documentation

## 2016-06-04 DIAGNOSIS — Z862 Personal history of diseases of the blood and blood-forming organs and certain disorders involving the immune mechanism: Secondary | ICD-10-CM | POA: Insufficient documentation

## 2016-06-04 DIAGNOSIS — I252 Old myocardial infarction: Secondary | ICD-10-CM | POA: Diagnosis not present

## 2016-06-04 DIAGNOSIS — M810 Age-related osteoporosis without current pathological fracture: Secondary | ICD-10-CM | POA: Diagnosis not present

## 2016-06-04 DIAGNOSIS — G4733 Obstructive sleep apnea (adult) (pediatric): Secondary | ICD-10-CM | POA: Insufficient documentation

## 2016-06-04 DIAGNOSIS — E119 Type 2 diabetes mellitus without complications: Secondary | ICD-10-CM | POA: Diagnosis not present

## 2016-06-04 DIAGNOSIS — M79609 Pain in unspecified limb: Secondary | ICD-10-CM

## 2016-06-04 DIAGNOSIS — J449 Chronic obstructive pulmonary disease, unspecified: Secondary | ICD-10-CM | POA: Insufficient documentation

## 2016-06-04 DIAGNOSIS — Y9389 Activity, other specified: Secondary | ICD-10-CM | POA: Diagnosis not present

## 2016-06-04 DIAGNOSIS — Z9981 Dependence on supplemental oxygen: Secondary | ICD-10-CM | POA: Diagnosis not present

## 2016-06-04 DIAGNOSIS — Y9289 Other specified places as the place of occurrence of the external cause: Secondary | ICD-10-CM | POA: Diagnosis not present

## 2016-06-04 DIAGNOSIS — M7989 Other specified soft tissue disorders: Secondary | ICD-10-CM | POA: Diagnosis not present

## 2016-06-04 DIAGNOSIS — Y998 Other external cause status: Secondary | ICD-10-CM | POA: Insufficient documentation

## 2016-06-04 DIAGNOSIS — X58XXXA Exposure to other specified factors, initial encounter: Secondary | ICD-10-CM | POA: Insufficient documentation

## 2016-06-04 DIAGNOSIS — R011 Cardiac murmur, unspecified: Secondary | ICD-10-CM | POA: Diagnosis not present

## 2016-06-04 DIAGNOSIS — T148XXA Other injury of unspecified body region, initial encounter: Secondary | ICD-10-CM

## 2016-06-04 DIAGNOSIS — I25119 Atherosclerotic heart disease of native coronary artery with unspecified angina pectoris: Secondary | ICD-10-CM | POA: Insufficient documentation

## 2016-06-04 DIAGNOSIS — M79662 Pain in left lower leg: Secondary | ICD-10-CM | POA: Diagnosis not present

## 2016-06-04 DIAGNOSIS — S86912A Strain of unspecified muscle(s) and tendon(s) at lower leg level, left leg, initial encounter: Secondary | ICD-10-CM | POA: Insufficient documentation

## 2016-06-04 DIAGNOSIS — M1612 Unilateral primary osteoarthritis, left hip: Secondary | ICD-10-CM | POA: Diagnosis not present

## 2016-06-04 LAB — CBG MONITORING, ED
Glucose-Capillary: 55 mg/dL — ABNORMAL LOW (ref 65–99)
Glucose-Capillary: 93 mg/dL (ref 65–99)

## 2016-06-04 MED ORDER — NAPROXEN 250 MG PO TABS
500.0000 mg | ORAL_TABLET | Freq: Once | ORAL | Status: AC
  Administered 2016-06-04: 500 mg via ORAL
  Filled 2016-06-04: qty 2

## 2016-06-04 MED ORDER — HYDROCODONE-ACETAMINOPHEN 5-325 MG PO TABS: 1.0000 | ORAL_TABLET | Freq: Four times a day (QID) | ORAL | Status: DC | PRN

## 2016-06-04 MED ORDER — ACETAMINOPHEN 325 MG PO TABS
650.0000 mg | ORAL_TABLET | Freq: Once | ORAL | Status: AC
  Administered 2016-06-04: 650 mg via ORAL
  Filled 2016-06-04: qty 2

## 2016-06-04 NOTE — ED Notes (Signed)
Patient placed back on the monitor. RN agreed to check CBG since patient states Diabetics and have not check all day . Patient states she did not want me to check CBG.

## 2016-06-04 NOTE — ED Notes (Signed)
Ambulated pt to restroom from room. Tolerated well, pt stated pain in left leg 6/10.

## 2016-06-04 NOTE — ED Notes (Signed)
Patient given OJ and graham cracker and peanut butter.

## 2016-06-04 NOTE — Telephone Encounter (Signed)
[facility] - Client [facility]  Patient Name: [name]  DOB: 11-26-1931    Initial Comment caller states her leg is swollen   Nurse Assessment  Nurse: Wayne Sever, RN, Tillie Rung Date/Time (Eastern Time): 06/04/2016 11:02:08 AM  Confirm and document reason for call. If symptomatic, describe symptoms. You must click the next button to save text entered. ---Caller states her left leg is swollen. She states it has swelling below the knee, half way down the leg. She states the leg was aching on the shin last night.  Has the patient traveled out of the country within the last 30 days? ---No  Does the patient have any new or worsening symptoms? ---Yes  Will a triage be completed? ---Yes  Related visit to physician within the last 2 weeks? ---No  Does the PT have any chronic conditions? (i.e. diabetes, asthma, etc.) ---Yes  List chronic conditions. ---Heart Issues, Has a Stent, HTN, Type 2 Diabetic,  Is this a behavioral health or substance abuse call? ---No     Guidelines    Guideline Title Affirmed Question Affirmed Notes  Leg Swelling and Edema [1] Thigh, calf, or ankle swelling AND [2] only 1 side    Final Disposition User   See Physician within 4 Hours (or PCP triage) Wayne Sever, RN, [place]    Comments  Office is booked for the day and I also checked [place] and it was the same. Caller will go to [facility]   Referrals  [facility]   Disagree/Comply: Comply

## 2016-06-04 NOTE — ED Notes (Signed)
Pt verbalized understanding of discharge instructions and follow up care

## 2016-06-04 NOTE — ED Notes (Signed)
Pt. Developed last night lt. Foot and shin pain.  The pain was severe and now she has swelling noted to her lt. Lateral calf area. No heat or redness noted. She  Has good ROM.[name] She does have pain 6/10 and it increases with walking.

## 2016-06-04 NOTE — ED Provider Notes (Signed)
DVT studies negative, plain films of hip remarkable for arthritis, no evidence of cellulitis or abscess to the left calf, mild tenderness to palpation, possible muscle strain, the patient the bike at the gym yesterday. Patient is ambulatory. Will discharge to home with primary care follow-up. Patient understands agrees the plan. She is stable and ready for discharge.  Montine Circle, PA-C 06/04/16 [place] Liu, MD 06/05/16 [phone]

## 2016-06-04 NOTE — Discharge Instructions (Signed)
Muscle Pain, Adult  Muscle pain (myalgia) may be caused by many things, including:  · Overuse or muscle strain, especially if you are not in shape. This is the most common cause of muscle pain.  · Injury.  · Bruises.  · Viruses, such as the flu.  · Infectious diseases.  · Fibromyalgia, which is a chronic condition that causes muscle tenderness, fatigue, and headache.  · Autoimmune diseases, including lupus.  · Certain drugs, including ACE inhibitors and statins.  Muscle pain may be mild or severe. In most cases, the pain lasts only a short time and goes away without treatment. To diagnose the cause of your muscle pain, your health care provider will take your medical history. This means he or she will ask you when your muscle pain began and what has been happening. If you have not had muscle pain for very long, your health care provider may want to wait before doing much testing. If your muscle pain has lasted a long time, your health care provider may want to run tests right away. If your health care provider thinks your muscle pain may be caused by illness, you may need to have additional tests to rule out certain conditions.   Treatment for muscle pain depends on the cause. Home care is often enough to relieve muscle pain. Your health care provider may also prescribe anti-inflammatory medicine.  HOME CARE INSTRUCTIONS  Watch your condition for any changes. The following actions may help to lessen any discomfort you are feeling:  · Only take over-the-counter or prescription medicines as directed by your health care provider.  · Apply ice to the sore muscle:    Put ice in a plastic bag.    Place a towel between your skin and the bag.    Leave the ice on for 15-20 minutes, 3-4 times a day.  · You may alternate applying hot and cold packs to the muscle as directed by your health care provider.  · If overuse is causing your muscle pain, slow down your activities until the pain goes away.    Remember that it is normal  to feel some muscle pain after starting a workout program. Muscles that have not been used often will be sore at first.    Do regular, gentle exercises if you are not usually active.    Warm up before exercising to lower your risk of muscle pain.  · Do not continue working out if the pain is very bad. Bad pain could mean you have injured a muscle.  SEEK MEDICAL CARE IF:  · Your muscle pain gets worse, and medicines do not help.  · You have muscle pain that lasts longer than 3 days.  · You have a rash or fever along with muscle pain.  · You have muscle pain after a tick bite.  · You have muscle pain while working out, even though you are in good physical condition.  · You have redness, soreness, or swelling along with muscle pain.  · You have muscle pain after starting a new medicine or changing the dose of a medicine.  SEEK IMMEDIATE MEDICAL CARE IF:  · You have trouble breathing.  · You have trouble swallowing.  · You have muscle pain along with a stiff neck, fever, and vomiting.  · You have severe muscle weakness or cannot move part of your body.  MAKE SURE YOU:   · Understand these instructions.  · Will watch your condition.  · Will get   help right away if you are not doing well or get worse.     This information is not intended to replace advice given to you by your health care provider. Make sure you discuss any questions you have with your health care provider.     Document Released: 11/04/2006 Document Revised: 01/03/2015 Document Reviewed: 10/09/2013  Elsevier Interactive Patient Education ©2016 [employer] Inc[employer]

## 2016-06-04 NOTE — ED Notes (Signed)
Ambulated pt to restroom from room.

## 2016-06-04 NOTE — ED Provider Notes (Signed)
CSN: [id number]     Arrival date & time 06/04/16  1218 History   First MD Initiated Contact with Patient 06/04/16 1404     Chief Complaint  Patient presents with  . Leg Pain     (Consider location/radiation/quality/duration/timing/severity/associated sxs/prior Treatment) HPI   [name] is a 82 y.o. female, with a history of Anemia, MI, and COPD, presenting to the ED with Left lower leg pain that began suddenly last night. Patient states she was sitting on the sofa when she suddenly had pain to her left anterior lower leg. When patient awoke this morning, she noticed swelling to the left lower leg as well. Rates her pain 6 out of 10 while at rest, aching/throbbing, nonradiating. Pain increases with ambulation. Patient denies trauma/falls, acute neuro deficits, or any other complaints. Patient further denies history of DVT/PE, cancer, recent immobilization, or recent surgeries or trauma. Patient states she is compliant with her Eliquis.      Past Medical History  Diagnosis Date  . Hypopotassemia     PMH of  . Anemia     iron defficiency  . Muscle pain   . Atrial fibrillation or flutter     s/p RFCA 7/08;   s/p DCCV in past;   previously on amiodarone;  amio stopped due to lung toxicity  . CAD (coronary artery disease)     s/p NSTEMI tx with BMS to OM1 3/08;  cath 3/08: pOM 99% tx with PCI, pLAD 20%, ? mod stenosis at the AM  . HLD (hyperlipidemia)   . HTN (hypertension)     essential nos  . Dystrophy, corneal stromal   . Chronic diastolic heart failure ([place])     echo 11/11:  EF 55-60%, severe LVH, mod LAE, mild MR, mildly increased PASP  . Osteoporosis   . Heart murmur   . Anginal pain ([place])   . Myocardial infarction ([place]) 2008  . COPD (chronic obstructive pulmonary disease) ([place])   . Asthma   . Pneumonia 05/27/2015  . OSA on CPAP   . Type II diabetes mellitus ([facility])     Dr Loanne Drilling  . Seasonal allergies   . Degenerative joint disease   . Arthritis     "knees, feet,  hands; joints" (05/27/2015)   Past Surgical History  Procedure Laterality Date  . Cholecystectomy    . Knee cartilage surgery Right   . Eye surgery    . A flutter ablation      Dr Lovena Le  . Gravid 2 para 2    . Colonoscopy  6/07    2 polyps Dr. Kinnie Feil in [place]  . Coronary angioplasty with stent placement  02/2007    BMS w/Dr Lovena Le  . Colonoscopy with polypectomy       X 2; [facility]  . Left and right heart catheterization with coronary angiogram N/A 05/07/2014    Procedure: LEFT AND RIGHT HEART CATHETERIZATION WITH CORONARY ANGIOGRAM;  Surgeon: Peter M Martinique, MD;  Location: [facility] CATH LAB;  Service: Cardiovascular;  Laterality: N/A;  . Cataract extraction w/ intraocular lens  implant, bilateral Bilateral   . Corneal transplant Bilateral    Family History  Problem Relation Age of Onset  . Diabetes Mother   . Hypertension Mother   . Transient ischemic attack Mother   . Heart attack Father 70  . Arthritis Father   . Breast cancer Maternal Aunt   . Arthritis Maternal Aunt   . Arthritis Mother   . Hypertension Father  Social History  Substance Use Topics  . Smoking status: Former Smoker -- 0.25 packs/day for 18 years    Types: Cigarettes    Quit date: 12/27/1968  . Smokeless tobacco: Never Used     Comment: smoked [place], up to 1 pp week  . Alcohol Use: No   OB History    No data available     Review of Systems  Constitutional: Negative for fever and chills.  Respiratory: Negative for shortness of breath.   Musculoskeletal: Positive for myalgias (left lower leg). Negative for joint swelling.  Neurological: Negative for weakness and numbness.  All other systems reviewed and are negative.     Allergies  Amlodipine besylate; Levaquin; Lobster; Penicillins; Valsartan; Codeine; and Tramadol hcl  Home Medications   Prior to Admission medications   Medication Sig Start Date End Date Taking? Authorizing Provider  apixaban (ELIQUIS) 5 MG TABS tablet Take 1 tablet (5  mg total) by mouth 2 (two) times daily. 05/18/16  Yes Jolaine Artist, MD  carvedilol (COREG) 12.5 MG tablet Take 1 tablet (12.5 mg total) by mouth 2 (two) times daily with a meal. 03/08/16  Yes Jolaine Artist, MD  furosemide (LASIX) 40 MG tablet Take 2 tablets (80 mg total) by mouth daily. 08/20/15  Yes Shaune Pascal Bensimhon, MD  glucose blood (ONE TOUCH TEST STRIPS) test strip Use to check blood sugar 2 times per day. Dx Code E11.9 (ONE TOUCH VERIO) 02/13/16  Yes Renato Shin, MD  insulin NPH Human (HUMULIN N,NOVOLIN N) 100 UNIT/ML injection Inject 35 Units into the skin daily.    Yes Historical Provider, MD  KLOR-CON M20 20 MEQ tablet TAKE ONE TABLET (20 MEQ TOTAL) BY MOUTH DAILY. 03/08/16  Yes Jolaine Artist, MD  Lancets [facility] ULTRASOFT) lancets Use to check blood sugar 2 times per day. Dx Code E11.9 02/13/16  Yes Renato Shin, MD  methimazole (TAPAZOLE) 5 MG tablet Take 5 mg by mouth 2 (two) times a week.   Yes Historical Provider, MD  nitroGLYCERIN (NITROSTAT) 0.4 MG SL tablet Place 0.4 mg under the tongue every 5 (five) minutes as needed for chest pain. Reported on 01/28/2016 05/02/14  Yes Evans Lance, MD  tiotropium (SPIRIVA) 18 MCG inhalation capsule Place 1 capsule (18 mcg total) into inhaler and inhale daily. 05/05/16  Yes [name], MD  TURMERIC PO Take 1 tablet by mouth daily.    Yes Historical Provider, MD  Vitamin D, Cholecalciferol, 1000 units TABS Take 2,000 Units by mouth daily.    Yes Historical Provider, MD   BP 154/67 mmHg  Pulse 87  Temp(Src) 97.3 F (36.3 C) (Oral)  Resp 18  SpO2 96% Physical Exam  Constitutional: She appears well-developed and well-nourished. No distress.  HENT:  Head: Normocephalic and atraumatic.  Eyes: Conjunctivae are normal.  Neck: Neck supple.  Cardiovascular: Normal rate, regular rhythm and intact distal pulses.   Pulmonary/Chest: Effort normal. No respiratory distress.  Abdominal: There is no guarding.  Musculoskeletal:  Some  swelling and tenderness noted to the left lower leg, more prominent on the anterior-lateral surface. No increased erythema or warmth. Full range of motion in both lower extremities.  Neurological: She is alert. She has normal reflexes.  No sensory deficits. Strength 5/5 in all extremities. Coordination intact.   Skin: Skin is warm and dry. She is not diaphoretic.  Psychiatric: She has a normal mood and affect. Her behavior is normal.  Nursing note and vitals reviewed.   ED Course  Procedures (including critical care time)   MDM   Final diagnoses:  Pain of left lower extremity  Left leg swelling    [name] presents with sudden onset left lower leg pain and swelling beginning last night.  Findings and plan of care discussed with Forde Dandy, MD. Dr. Oleta Mouse personally evaluated and examined this patient.  Patient's presentation and physical exam findings give evidence for DVT rule out. No signs of infection or trauma. No neuro deficits. End of shift patient care report given to Montine Circle, PA-C. Plan: Review results of duplex US. If negative for DVT, discharge patient with pain control and orthopedic or PCP follow-up.   Filed Vitals:   06/04/16 1222 06/04/16 1530  BP: 154/67 148/131  Pulse: 87 73  Temp: 97.3 F (36.3 C)   TempSrc: Oral   Resp: 18   SpO2: 96% 97%      Lorayne Bender, PA-C 06/04/16 [place] Liu, MD 06/04/16 [phone]

## 2016-06-04 NOTE — Progress Notes (Signed)
*  PRELIMINARY RESULTS* Vascular Ultrasound Left lower extremity venous duplex has been completed.  Preliminary findings: No evidence of DVT or baker's cyst.   Landry Mellow, RDMS, RVT  06/04/2016, 4:00 PM

## 2016-06-04 NOTE — ED Notes (Signed)
Ace bandage wrap applied to left calf

## 2016-06-09 ENCOUNTER — Other Ambulatory Visit (INDEPENDENT_AMBULATORY_CARE_PROVIDER_SITE_OTHER): Payer: Medicare Other

## 2016-06-09 ENCOUNTER — Ambulatory Visit (INDEPENDENT_AMBULATORY_CARE_PROVIDER_SITE_OTHER): Payer: Medicare Other | Admitting: Internal Medicine

## 2016-06-09 ENCOUNTER — Encounter: Payer: Self-pay | Admitting: Internal Medicine

## 2016-06-09 VITALS — BP 176/92 | HR 96 | Temp 97.8°F | Resp 16 | Wt 177.0 lb

## 2016-06-09 DIAGNOSIS — N183 Chronic kidney disease, stage 3 unspecified: Secondary | ICD-10-CM

## 2016-06-09 DIAGNOSIS — Z794 Long term (current) use of insulin: Secondary | ICD-10-CM

## 2016-06-09 DIAGNOSIS — M79605 Pain in left leg: Secondary | ICD-10-CM | POA: Diagnosis not present

## 2016-06-09 DIAGNOSIS — R21 Rash and other nonspecific skin eruption: Secondary | ICD-10-CM

## 2016-06-09 DIAGNOSIS — N186 End stage renal disease: Secondary | ICD-10-CM

## 2016-06-09 DIAGNOSIS — E0590 Thyrotoxicosis, unspecified without thyrotoxic crisis or storm: Secondary | ICD-10-CM

## 2016-06-09 DIAGNOSIS — E1122 Type 2 diabetes mellitus with diabetic chronic kidney disease: Secondary | ICD-10-CM

## 2016-06-09 LAB — CBC WITH DIFFERENTIAL/PLATELET
BASOS ABS: 0 10*3/uL (ref 0.0–0.1)
Basophils Relative: 0.4 % (ref 0.0–3.0)
EOS ABS: 0.4 10*3/uL (ref 0.0–0.7)
Eosinophils Relative: 7.5 % — ABNORMAL HIGH (ref 0.0–5.0)
HEMATOCRIT: 46.2 % — AB (ref 36.0–46.0)
HEMOGLOBIN: 15.2 g/dL — AB (ref 12.0–15.0)
LYMPHS PCT: 24.8 % (ref 12.0–46.0)
Lymphs Abs: 1.4 10*3/uL (ref 0.7–4.0)
MCHC: 32.8 g/dL (ref 30.0–36.0)
MCV: 88.5 fl (ref 78.0–100.0)
MONOS PCT: 12 % (ref 3.0–12.0)
Monocytes Absolute: 0.7 10*3/uL (ref 0.1–1.0)
NEUTROS ABS: 3.2 10*3/uL (ref 1.4–7.7)
Neutrophils Relative %: 55.3 % (ref 43.0–77.0)
PLATELETS: 222 10*3/uL (ref 150.0–400.0)
RBC: 5.22 Mil/uL — AB (ref 3.87–5.11)
RDW: 15.4 % (ref 11.5–15.5)
WBC: 5.8 10*3/uL (ref 4.0–10.5)

## 2016-06-09 LAB — COMPREHENSIVE METABOLIC PANEL
ALBUMIN: 4.2 g/dL (ref 3.5–5.2)
ALK PHOS: 106 U/L (ref 39–117)
ALT: 17 U/L (ref 0–35)
AST: 22 U/L (ref 0–37)
BILIRUBIN TOTAL: 0.5 mg/dL (ref 0.2–1.2)
BUN: 22 mg/dL (ref 6–23)
CALCIUM: 10.1 mg/dL (ref 8.4–10.5)
CHLORIDE: 97 meq/L (ref 96–112)
CO2: 31 mEq/L (ref 19–32)
CREATININE: 1.02 mg/dL (ref 0.40–1.20)
GFR: 66.63 mL/min (ref >60.00)
Glucose, Bld: 196 mg/dL — ABNORMAL HIGH (ref 70–99)
Potassium: 4.1 mEq/L (ref 3.5–5.1)
Sodium: 137 mEq/L (ref 135–145)
TOTAL PROTEIN: 8.1 g/dL (ref 6.0–8.3)

## 2016-06-09 LAB — TSH: TSH: 1.77 u[IU]/mL (ref 0.35–4.50)

## 2016-06-09 LAB — HEMOGLOBIN A1C: HEMOGLOBIN A1C: 8.4 % — AB (ref 4.6–6.5)

## 2016-06-09 NOTE — Assessment & Plan Note (Signed)
Resolved Went to the emergency room and x-ray, ultrasound negative Possibly muscle injury related to exercise

## 2016-06-09 NOTE — Progress Notes (Signed)
Subjective:    Patient ID: [name], female    DOB: 1931/05/14, 82 y.o.   MRN: [id number]  HPI She is here for follow-up from the emergency room. She went there for leg pain and swelling.  She started having pain in her left leg and it began to swell in the lower leg - upper, lateral portion.  She was concered about a DVT.  She went to the ED on 6/9.  She had xrays and an [place].  [place] was neg for DVT.  The xray showed no acute findings.   The pain was severe and she did take vicodin, which helped.  She last took it yesterday.  The pain is much better, but still there - almost gone.   The swelling has gone down and is almost gone. She denies any other trauma, but didn't go to the gym daily for her symptoms started. She has not been to the gentleman a while and didn't think she may have overdone it-she did not have any pain that night. That is the only thing that she thinks may have caused her symptoms.     Rash:  She has had a rash on her bilateral elbows x few months. No pain.  Little itching on occasion. She denies rash elsewhere. She does have an atypical mole or cyst on her right arm, but no other skin abnormalities. She denies any changes in products, food diet or medication/supplements. She did try applying an antibacterial ointment, but there is no improvement. She denies any other systemic symptoms including gastrointestinal symptoms.  Medications and allergies reviewed with patient and updated if appropriate.  Patient Active Problem List   Diagnosis Date Noted  . Diabetes ([place]) 03/07/2016  . Acute encephalopathy   . Acute renal failure superimposed on stage 3 chronic kidney disease ([place])   . Acute on chronic diastolic CHF (congestive heart failure) ([place])   . Acute respiratory failure with hypoxia ([place]) 05/24/2015  . CAP (community acquired pneumonia) 05/24/2015  . Abnormal urinalysis 05/24/2015  . Sepsis ([place]) 05/24/2015  . OSA on CPAP 08/28/2014  . DJD (degenerative joint  disease) of knee 08/16/2014  . Chronic diastolic heart failure ([place]) 07/30/2014  . Nocturnal hypoxemia 07/09/2014  . Dyspnea 05/15/2014  . Pulmonary HTN ([place]) 05/15/2014  . Crescendo angina ([place]) 05/02/2014  . Hyperthyroidism 11/04/2011  . Multinodular goiter 10/14/2011  . LUMBAR RADICULOPATHY, RIGHT 09/23/2010  . ATRIAL FIBRILLATION 06/03/2010  . CHF 02/17/2010  . VITILIGO 11/07/2009  . SINOATRIAL NODE DYSFUNCTION 06/16/2009  . Iron deficiency anemia 01/02/2009  . ANEMIA, PERNICIOUS 11/20/2008  . DEGENERATIVE JOINT DISEASE 11/20/2008  . Atrial flutter ([place]) 06/25/2008  . HYPERLIPIDEMIA 01/29/2008  . Coronary atherosclerosis 01/29/2008  . HTN (hypertension) 10/30/2007    Current Outpatient Prescriptions on File Prior to Visit  Medication Sig Dispense Refill  . apixaban (ELIQUIS) 5 MG TABS tablet Take 1 tablet (5 mg total) by mouth 2 (two) times daily. 180 tablet 3  . carvedilol (COREG) 12.5 MG tablet Take 1 tablet (12.5 mg total) by mouth 2 (two) times daily with a meal. 180 tablet 3  . furosemide (LASIX) 40 MG tablet Take 2 tablets (80 mg total) by mouth daily. 180 tablet 3  . glucose blood (ONE TOUCH TEST STRIPS) test strip Use to check blood sugar 2 times per day. Dx Code E11.9 (ONE TOUCH VERIO) 200 each 2  . HYDROcodone-acetaminophen (NORCO/VICODIN) 5-325 MG tablet Take 1-2 tablets by mouth every 6 (six) hours as needed. 7  tablet 0  . insulin NPH Human (HUMULIN N,NOVOLIN N) 100 UNIT/ML injection Inject 35 Units into the skin daily.     Marland Kitchen KLOR-CON M20 20 MEQ tablet TAKE ONE TABLET (20 MEQ TOTAL) BY MOUTH DAILY. 90 tablet 3  . Lancets (ONETOUCH ULTRASOFT) lancets Use to check blood sugar 2 times per day. Dx Code E11.9 200 each 2  . methimazole (TAPAZOLE) 5 MG tablet Take 5 mg by mouth 2 (two) times a week.    . nitroGLYCERIN (NITROSTAT) 0.4 MG SL tablet Place 0.4 mg under the tongue every 5 (five) minutes as needed for chest pain. Reported on 01/28/2016    . tiotropium (SPIRIVA) 18  MCG inhalation capsule Place 1 capsule (18 mcg total) into inhaler and inhale daily. 90 capsule 2  . TURMERIC PO Take 1 tablet by mouth daily.     . Vitamin D, Cholecalciferol, 1000 units TABS Take 2,000 Units by mouth daily.      No current facility-administered medications on file prior to visit.    Past Medical History  Diagnosis Date  . Hypopotassemia     PMH of  . Anemia     iron defficiency  . Muscle pain   . Atrial fibrillation or flutter     s/p RFCA 7/08;   s/p DCCV in past;   previously on amiodarone;  amio stopped due to lung toxicity  . CAD (coronary artery disease)     s/p NSTEMI tx with BMS to OM1 3/08;  cath 3/08: pOM 99% tx with PCI, pLAD 20%, ? mod stenosis at the AM  . HLD (hyperlipidemia)   . HTN (hypertension)     essential nos  . Dystrophy, corneal stromal   . Chronic diastolic heart failure ([place])     echo 11/11:  EF 55-60%, severe LVH, mod LAE, mild MR, mildly increased PASP  . Osteoporosis   . Heart murmur   . Anginal pain ([place])   . Myocardial infarction ([place]) 2008  . COPD (chronic obstructive pulmonary disease) ([place])   . Asthma   . Pneumonia 05/27/2015  . OSA on CPAP   . Type II diabetes mellitus ([facility])     Dr Loanne Drilling  . Seasonal allergies   . Degenerative joint disease   . Arthritis     "knees, feet, hands; joints" (05/27/2015)    Past Surgical History  Procedure Laterality Date  . Cholecystectomy    . Knee cartilage surgery Right   . Eye surgery    . A flutter ablation      Dr Lovena Le  . Gravid 2 para 2    . Colonoscopy  6/07    2 polyps Dr. Kinnie Feil in [place]  . Coronary angioplasty with stent placement  02/2007    BMS w/Dr Lovena Le  . Colonoscopy with polypectomy       X 2; [facility]  . Left and right heart catheterization with coronary angiogram N/A 05/07/2014    Procedure: LEFT AND RIGHT HEART CATHETERIZATION WITH CORONARY ANGIOGRAM;  Surgeon: Peter M Martinique, MD;  Location: [facility] CATH LAB;  Service: Cardiovascular;  Laterality: N/A;  . Cataract  extraction w/ intraocular lens  implant, bilateral Bilateral   . Corneal transplant Bilateral     Social History   Social History  . Marital Status: Widowed    Spouse Name: N/A  . Number of Children: N/A  . Years of Education: N/A   Occupational History  . Teacher    Social History Main Topics  . Smoking status: Former  Smoker -- 0.25 packs/day for 18 years    Types: Cigarettes    Quit date: 12/27/1968  . Smokeless tobacco: Never Used     Comment: smoked [place], up to 1 pp week  . Alcohol Use: No  . Drug Use: No  . Sexual Activity: Yes   Other Topics Concern  . None   Social History Sports administrator. Widowed. Rarely drinks cafeine.     Family History  Problem Relation Age of Onset  . Diabetes Mother   . Hypertension Mother   . Transient ischemic attack Mother   . Heart attack Father 33  . Arthritis Father   . Breast cancer Maternal Aunt   . Arthritis Maternal Aunt   . Arthritis Mother   . Hypertension Father     Review of Systems  Constitutional: Negative for fever.  Respiratory: Negative for cough, shortness of breath and wheezing.   Cardiovascular: Negative for chest pain.  Gastrointestinal: Negative for nausea, abdominal pain, diarrhea and abdominal distention.  Skin: Positive for rash.       Objective:   Filed Vitals:   06/09/16 1547  BP: 176/92  Pulse: 96  Temp: 97.8 F (36.6 C)  Resp: 16   Filed Weights   06/09/16 1547  Weight: 177 lb (80.287 kg)   Body mass index is 31.36 kg/(m^2).   Physical Exam  Constitutional: She appears well-developed and well-nourished. No distress.  Musculoskeletal: She exhibits no edema or tenderness (Left lower extremity).  Skin: She is not diaphoretic.  Cyst like a mole right upper arm-no recent change; varicose veins bilateral lower extremities, no erythema lower extremities; the rash with areas of hyperpigmentation bilateral elbows and posterior forearms, no blisters or open wounds          Assessment & Plan:   See Problem List for Assessment and Plan of chronic medical problems.

## 2016-06-09 NOTE — Patient Instructions (Addendum)
  Test(s) ordered today. Your results will be released to MyChart (or called to you) after review, usually within 72hours after test completion. If any changes need to be made, you will be notified at that same time.    Medications reviewed and updated.  No changes recommended at this time.     

## 2016-06-09 NOTE — Assessment & Plan Note (Signed)
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

## 2016-06-09 NOTE — Progress Notes (Signed)
Pre visit review using our clinic review tool, if applicable. No additional management support is needed unless otherwise documented below in the visit note. 

## 2016-06-10 ENCOUNTER — Ambulatory Visit (INDEPENDENT_AMBULATORY_CARE_PROVIDER_SITE_OTHER): Payer: Medicare Other | Admitting: Endocrinology

## 2016-06-10 ENCOUNTER — Encounter: Payer: Self-pay | Admitting: Endocrinology

## 2016-06-10 VITALS — BP 170/82 | HR 101 | Wt 178.4 lb

## 2016-06-10 DIAGNOSIS — Z794 Long term (current) use of insulin: Secondary | ICD-10-CM

## 2016-06-10 DIAGNOSIS — E0590 Thyrotoxicosis, unspecified without thyrotoxic crisis or storm: Secondary | ICD-10-CM | POA: Diagnosis not present

## 2016-06-10 DIAGNOSIS — E1122 Type 2 diabetes mellitus with diabetic chronic kidney disease: Secondary | ICD-10-CM | POA: Diagnosis not present

## 2016-06-10 DIAGNOSIS — N183 Chronic kidney disease, stage 3 (moderate): Secondary | ICD-10-CM

## 2016-06-10 LAB — TISSUE TRANSGLUTAMINASE, IGA: TISSUE TRANSGLUTAMINASE AB, IGA: 1 U/mL (ref <4)

## 2016-06-10 LAB — RETICULIN ANTIBODIES, IGA W TITER: RETICULIN AB, IGA: NEGATIVE

## 2016-06-10 LAB — GLIADIN ANTIBODIES, SERUM
GLIADIN IGA: 16 U (ref <20)
GLIADIN IGG: 2 U (ref <20)

## 2016-06-10 NOTE — Progress Notes (Signed)
Subjective:    Patient ID: [name], female    DOB: 1931/10/17, 82 y.o.   MRN: [id number]  HPI Pt returns for f/u of diabetes mellitus: DM type: Insulin-requiring type 2.  Dx'ed: [id number] Complications: CAD, polyneuropathy, and renal insufficiency.   Therapy: insulin since 2007.  GDM: never DKA: never Severe hypoglycemia: never.  Pancreatitis: never.  Other: she changed to qd insulin, after poor results with multiple daily injections.   Interval history: she brings a record of her cbg's which i have reviewed today.  It varies from 59-200's.  She says it is still mildly low when she misses lunch--this happens approx twice per month.   Pt also returns for f/u of hyperthyroidism (dx'ed 2012; [place] then showed multinodular goiter; tapazole was chose as rx, due to low uptake on nuc med scan).  Denies fever.   Past Medical History  Diagnosis Date  . Hypopotassemia     PMH of  . Anemia     iron defficiency  . Muscle pain   . Atrial fibrillation or flutter     s/p RFCA 7/08;   s/p DCCV in past;   previously on amiodarone;  amio stopped due to lung toxicity  . CAD (coronary artery disease)     s/p NSTEMI tx with BMS to OM1 3/08;  cath 3/08: pOM 99% tx with PCI, pLAD 20%, ? mod stenosis at the AM  . HLD (hyperlipidemia)   . HTN (hypertension)     essential nos  . Dystrophy, corneal stromal   . Chronic diastolic heart failure ([place])     echo 11/11:  EF 55-60%, severe LVH, mod LAE, mild MR, mildly increased PASP  . Osteoporosis   . Heart murmur   . Anginal pain ([place])   . Myocardial infarction ([place]) 2008  . COPD (chronic obstructive pulmonary disease) ([place])   . Asthma   . Pneumonia 05/27/2015  . OSA on CPAP   . Type II diabetes mellitus ([facility])     Dr Loanne Drilling  . Seasonal allergies   . Degenerative joint disease   . Arthritis     "knees, feet, hands; joints" (05/27/2015)    Past Surgical History  Procedure Laterality Date  . Cholecystectomy    . Knee cartilage surgery Right     . Eye surgery    . A flutter ablation      Dr Lovena Le  . Gravid 2 para 2    . Colonoscopy  6/07    2 polyps Dr. Kinnie Feil in [place]  . Coronary angioplasty with stent placement  02/2007    BMS w/Dr Lovena Le  . Colonoscopy with polypectomy       X 2; [facility]  . Left and right heart catheterization with coronary angiogram N/A 05/07/2014    Procedure: LEFT AND RIGHT HEART CATHETERIZATION WITH CORONARY ANGIOGRAM;  Surgeon: Peter M Martinique, MD;  Location: [facility] CATH LAB;  Service: Cardiovascular;  Laterality: N/A;  . Cataract extraction w/ intraocular lens  implant, bilateral Bilateral   . Corneal transplant Bilateral     Social History   Social History  . Marital Status: Widowed    Spouse Name: N/A  . Number of Children: N/A  . Years of Education: N/A   Occupational History  . Teacher    Social History Main Topics  . Smoking status: Former Smoker -- 0.25 packs/day for 18 years    Types: Cigarettes    Quit date: 12/27/1968  . Smokeless tobacco: Never Used  Comment: smoked [place], up to 1 pp week  . Alcohol Use: No  . Drug Use: No  . Sexual Activity: Yes   Other Topics Concern  . Not on file   Social History Narrative   Teacher. Widowed. Rarely drinks cafeine.     Current Outpatient Prescriptions on File Prior to Visit  Medication Sig Dispense Refill  . apixaban (ELIQUIS) 5 MG TABS tablet Take 1 tablet (5 mg total) by mouth 2 (two) times daily. 180 tablet 3  . carvedilol (COREG) 12.5 MG tablet Take 1 tablet (12.5 mg total) by mouth 2 (two) times daily with a meal. 180 tablet 3  . furosemide (LASIX) 40 MG tablet Take 2 tablets (80 mg total) by mouth daily. 180 tablet 3  . glucose blood (ONE TOUCH TEST STRIPS) test strip Use to check blood sugar 2 times per day. Dx Code E11.9 (ONE TOUCH VERIO) 200 each 2  . insulin NPH Human (HUMULIN N,NOVOLIN N) 100 UNIT/ML injection Inject 40 Units into the skin daily.     . Lancets (ONETOUCH ULTRASOFT) lancets Use to check blood sugar 2  times per day. Dx Code E11.9 200 each 2  . methimazole (TAPAZOLE) 5 MG tablet Take 5 mg by mouth 2 (two) times a week.    . Multiple Vitamins-Minerals (CENTRUM SILVER ULTRA WOMENS PO) Take by mouth.    . nitroGLYCERIN (NITROSTAT) 0.4 MG SL tablet Place 0.4 mg under the tongue every 5 (five) minutes as needed for chest pain. Reported on 01/28/2016    . tiotropium (SPIRIVA) 18 MCG inhalation capsule Place 1 capsule (18 mcg total) into inhaler and inhale daily. 90 capsule 2  . TURMERIC PO Take 1 tablet by mouth daily.     . Vitamin D, Cholecalciferol, 1000 units TABS Take 2,000 Units by mouth daily.      No current facility-administered medications on file prior to visit.    Allergies  Allergen Reactions  . Amlodipine Besylate Other (See Comments)    REACTION: tingling in lips & gum edema  . Levaquin [Levofloxacin] Shortness Of Breath and Swelling    angioedema  . Lobster [Shellfish Allergy] Other (See Comments)    angioedema  . Penicillins Rash  . Valsartan Other (See Comments)    REACTION: angioedema  . Codeine Other (See Comments)    Mental status changes  . Tramadol Hcl Nausea And Vomiting    Family History  Problem Relation Age of Onset  . Diabetes Mother   . Hypertension Mother   . Transient ischemic attack Mother   . Heart attack Father 94  . Arthritis Father   . Breast cancer Maternal Aunt   . Arthritis Maternal Aunt   . Arthritis Mother   . Hypertension Father     BP 170/82 mmHg  Pulse 101  Wt 178 lb 6.4 oz (80.922 kg)  SpO2 93%  Review of Systems Denies LOC    Objective:   Physical Exam VITAL SIGNS:  See vs page GENERAL: no distress Pulses: dorsalis pedis intact bilat.   MSK: no deformity of the feet CV: no leg edema Skin:  no ulcer on the feet.  normal color and temp on the feet.   Neuro: sensation is intact to touch on the feet.     Lab Results  Component Value Date   HGBA1C 8.4* 06/09/2016   Lab Results  Component Value Date   TSH 1.77  06/09/2016      Assessment & Plan:  Insulin-requiring type 2 DM: worse Hyperthyroidism:  well-controlled.     Patient is advised the following: Patient Instructions  check your blood sugar twice a day.  vary the time of day when you check, between before the 3 meals, and at bedtime.  also check if you have symptoms of your blood sugar being too high or too low.  please keep a record of the readings and bring it to your next appointment here.  please call us sooner if your blood sugar goes below 70, or if you have a lot of readings over 200.   Please continue the same methimazole.   On this type of insulin schedule, you should eat meals on a regular schedule--especially lunch.  If a meal is missed or significantly delayed, your blood sugar could go low.   Please increase the insulin to 40 units each morning.  However, if you are going to be active, take just 25 units.   On the day of surgery, don't take the insulin.   Please come back for a follow-up appointment in 3 months.    Diabetes Mellitus and Food It is important for you to manage your blood sugar (glucose) level. Your blood glucose level can be greatly affected by what you eat. Eating healthier foods in the appropriate amounts throughout the day at about the same time each day will help you control your blood glucose level. It can also help slow or prevent worsening of your diabetes mellitus. Healthy eating may even help you improve the level of your blood pressure and reach or maintain a healthy weight.  General recommendations for healthful eating and cooking habits include:  Eating meals and snacks regularly. Avoid going long periods of time without eating to lose weight.  Eating a diet that consists mainly of plant-based foods, such as fruits, vegetables, nuts, legumes, and whole grains.  Using low-heat cooking methods, such as baking, instead of high-heat cooking methods, such as deep frying. Work with your dietitian to make sure  you understand how to use the Nutrition Facts information on food labels. HOW CAN FOOD AFFECT ME? Carbohydrates Carbohydrates affect your blood glucose level more than any other type of food. Your dietitian will help you determine how many carbohydrates to eat at each meal and teach you how to count carbohydrates. Counting carbohydrates is important to keep your blood glucose at a healthy level, especially if you are using insulin or taking certain medicines for diabetes mellitus. Alcohol Alcohol can cause sudden decreases in blood glucose (hypoglycemia), especially if you use insulin or take certain medicines for diabetes mellitus. Hypoglycemia can be a life-threatening condition. Symptoms of hypoglycemia (sleepiness, dizziness, and disorientation) are similar to symptoms of having too much alcohol.  If your health care provider has given you approval to drink alcohol, do so in moderation and use the following guidelines:  Women should not have more than one drink per day, and men should not have more than two drinks per day. One drink is equal to:  12 oz of beer.  5 oz of wine.  1 oz of hard liquor.  Do not drink on an empty stomach.  Keep yourself hydrated. Have water, diet soda, or unsweetened iced tea.  Regular soda, juice, and other mixers might contain a lot of carbohydrates and should be counted. WHAT FOODS ARE NOT RECOMMENDED? As you make food choices, it is important to remember that all foods are not the same. Some foods have fewer nutrients per serving than other foods, even though they might have the same  number of calories or carbohydrates. It is difficult to get your body what it needs when you eat foods with fewer nutrients. Examples of foods that you should avoid that are high in calories and carbohydrates but low in nutrients include:  Trans fats (most processed foods list trans fats on the Nutrition Facts label).  Regular soda.  Juice.  Candy.  Sweets, such as  cake, pie, doughnuts, and cookies.  Fried foods. WHAT FOODS CAN I EAT? Eat nutrient-rich foods, which will nourish your body and keep you healthy. The food you should eat also will depend on several factors, including:  The calories you need.  The medicines you take.  Your weight.  Your blood glucose level.  Your blood pressure level.  Your cholesterol level. You should eat a variety of foods, including:  Protein.  Lean cuts of meat.  Proteins low in saturated fats, such as fish, egg whites, and beans. Avoid processed meats.  Fruits and vegetables.  Fruits and vegetables that may help control blood glucose levels, such as apples, mangoes, and yams.  Dairy products.  Choose fat-free or low-fat dairy products, such as milk, yogurt, and cheese.  Grains, bread, pasta, and rice.  Choose whole grain products, such as multigrain bread, whole oats, and brown rice. These foods may help control blood pressure.  Fats.  Foods containing healthful fats, such as nuts, avocado, olive oil, canola oil, and fish. DOES EVERYONE WITH DIABETES MELLITUS HAVE THE SAME MEAL PLAN? Because every person with diabetes mellitus is different, there is not one meal plan that works for everyone. It is very important that you meet with a dietitian who will help you create a meal plan that is just right for you.   This information is not intended to replace advice given to you by your health care provider. Make sure you discuss any questions you have with your health care provider.   Document Released: 09/09/2005 Document Revised: 01/03/2015 Document Reviewed: 11/09/2013 Elsevier Interactive Patient Education 2016 [employer].   Renato Shin, MD

## 2016-06-10 NOTE — Patient Instructions (Addendum)
check your blood sugar twice a day.  vary the time of day when you check, between before the 3 meals, and at bedtime.  also check if you have symptoms of your blood sugar being too high or too low.  please keep a record of the readings and bring it to your next appointment here.  please call us sooner if your blood sugar goes below 70, or if you have a lot of readings over 200.   Please continue the same methimazole.   On this type of insulin schedule, you should eat meals on a regular schedule--especially lunch.  If a meal is missed or significantly delayed, your blood sugar could go low.   Please increase the insulin to 40 units each morning.  However, if you are going to be active, take just 25 units.   On the day of surgery, don't take the insulin.   Please come back for a follow-up appointment in 3 months.    Diabetes Mellitus and Food It is important for you to manage your blood sugar (glucose) level. Your blood glucose level can be greatly affected by what you eat. Eating healthier foods in the appropriate amounts throughout the day at about the same time each day will help you control your blood glucose level. It can also help slow or prevent worsening of your diabetes mellitus. Healthy eating may even help you improve the level of your blood pressure and reach or maintain a healthy weight.  General recommendations for healthful eating and cooking habits include:  Eating meals and snacks regularly. Avoid going long periods of time without eating to lose weight.  Eating a diet that consists mainly of plant-based foods, such as fruits, vegetables, nuts, legumes, and whole grains.  Using low-heat cooking methods, such as baking, instead of high-heat cooking methods, such as deep frying. Work with your dietitian to make sure you understand how to use the Nutrition Facts information on food labels. HOW CAN FOOD AFFECT ME? Carbohydrates Carbohydrates affect your blood glucose level more than any  other type of food. Your dietitian will help you determine how many carbohydrates to eat at each meal and teach you how to count carbohydrates. Counting carbohydrates is important to keep your blood glucose at a healthy level, especially if you are using insulin or taking certain medicines for diabetes mellitus. Alcohol Alcohol can cause sudden decreases in blood glucose (hypoglycemia), especially if you use insulin or take certain medicines for diabetes mellitus. Hypoglycemia can be a life-threatening condition. Symptoms of hypoglycemia (sleepiness, dizziness, and disorientation) are similar to symptoms of having too much alcohol.  If your health care provider has given you approval to drink alcohol, do so in moderation and use the following guidelines:  Women should not have more than one drink per day, and men should not have more than two drinks per day. One drink is equal to:  12 oz of beer.  5 oz of wine.  1 oz of hard liquor.  Do not drink on an empty stomach.  Keep yourself hydrated. Have water, diet soda, or unsweetened iced tea.  Regular soda, juice, and other mixers might contain a lot of carbohydrates and should be counted. WHAT FOODS ARE NOT RECOMMENDED? As you make food choices, it is important to remember that all foods are not the same. Some foods have fewer nutrients per serving than other foods, even though they might have the same number of calories or carbohydrates. It is difficult to get your body what  it needs when you eat foods with fewer nutrients. Examples of foods that you should avoid that are high in calories and carbohydrates but low in nutrients include:  Trans fats (most processed foods list trans fats on the Nutrition Facts label).  Regular soda.  Juice.  Candy.  Sweets, such as cake, pie, doughnuts, and cookies.  Fried foods. WHAT FOODS CAN I EAT? Eat nutrient-rich foods, which will nourish your body and keep you healthy. The food you should eat also  will depend on several factors, including:  The calories you need.  The medicines you take.  Your weight.  Your blood glucose level.  Your blood pressure level.  Your cholesterol level. You should eat a variety of foods, including:  Protein.  Lean cuts of meat.  Proteins low in saturated fats, such as fish, egg whites, and beans. Avoid processed meats.  Fruits and vegetables.  Fruits and vegetables that may help control blood glucose levels, such as apples, mangoes, and yams.  Dairy products.  Choose fat-free or low-fat dairy products, such as milk, yogurt, and cheese.  Grains, bread, pasta, and rice.  Choose whole grain products, such as multigrain bread, whole oats, and brown rice. These foods may help control blood pressure.  Fats.  Foods containing healthful fats, such as nuts, avocado, olive oil, canola oil, and fish. DOES EVERYONE WITH DIABETES MELLITUS HAVE THE SAME MEAL PLAN? Because every person with diabetes mellitus is different, there is not one meal plan that works for everyone. It is very important that you meet with a dietitian who will help you create a meal plan that is just right for you.   This information is not intended to replace advice given to you by your health care provider. Make sure you discuss any questions you have with your health care provider.   Document Released: 09/09/2005 Document Revised: 01/03/2015 Document Reviewed: 11/09/2013 Elsevier Interactive Patient Education [employer].

## 2016-06-11 ENCOUNTER — Ambulatory Visit (HOSPITAL_COMMUNITY)
Admission: RE | Admit: 2016-06-11 | Discharge: 2016-06-11 | Disposition: A | Discharge status: Home / Self Care | Payer: Medicare Other | Source: Ambulatory Visit | Attending: Internal Medicine | Admitting: Internal Medicine

## 2016-06-11 VITALS — BP 136/68 | HR 85 | Wt 178.2 lb

## 2016-06-11 DIAGNOSIS — I272 Other secondary pulmonary hypertension: Secondary | ICD-10-CM | POA: Diagnosis not present

## 2016-06-11 DIAGNOSIS — Z885 Allergy status to narcotic agent status: Secondary | ICD-10-CM | POA: Insufficient documentation

## 2016-06-11 DIAGNOSIS — Z01810 Encounter for preprocedural cardiovascular examination: Secondary | ICD-10-CM | POA: Diagnosis not present

## 2016-06-11 DIAGNOSIS — I5032 Chronic diastolic (congestive) heart failure: Secondary | ICD-10-CM | POA: Insufficient documentation

## 2016-06-11 DIAGNOSIS — I4892 Unspecified atrial flutter: Secondary | ICD-10-CM | POA: Insufficient documentation

## 2016-06-11 DIAGNOSIS — E663 Overweight: Secondary | ICD-10-CM | POA: Diagnosis not present

## 2016-06-11 DIAGNOSIS — I482 Chronic atrial fibrillation, unspecified: Secondary | ICD-10-CM

## 2016-06-11 DIAGNOSIS — Z7901 Long term (current) use of anticoagulants: Secondary | ICD-10-CM | POA: Insufficient documentation

## 2016-06-11 DIAGNOSIS — I110 Hypertensive heart disease with heart failure: Secondary | ICD-10-CM | POA: Insufficient documentation

## 2016-06-11 DIAGNOSIS — Z794 Long term (current) use of insulin: Secondary | ICD-10-CM | POA: Insufficient documentation

## 2016-06-11 DIAGNOSIS — I2510 Atherosclerotic heart disease of native coronary artery without angina pectoris: Secondary | ICD-10-CM | POA: Insufficient documentation

## 2016-06-11 DIAGNOSIS — Z6831 Body mass index (BMI) 31.0-31.9, adult: Secondary | ICD-10-CM | POA: Diagnosis not present

## 2016-06-11 DIAGNOSIS — Z880 Allergy status to penicillin: Secondary | ICD-10-CM | POA: Diagnosis not present

## 2016-06-11 DIAGNOSIS — Z888 Allergy status to other drugs, medicaments and biological substances status: Secondary | ICD-10-CM | POA: Insufficient documentation

## 2016-06-11 DIAGNOSIS — G4733 Obstructive sleep apnea (adult) (pediatric): Secondary | ICD-10-CM | POA: Insufficient documentation

## 2016-06-11 DIAGNOSIS — Z79899 Other long term (current) drug therapy: Secondary | ICD-10-CM | POA: Insufficient documentation

## 2016-06-11 NOTE — Patient Instructions (Signed)
Doing well!  Your physician recommends that you schedule a follow-up appointment in: 4 months with Dr. Haroldine Laws  Do the following things EVERYDAY: 1) Weigh yourself in the morning before breakfast. Write it down and keep it in a log. 2) Take your medicines as prescribed 3) Eat low salt foods-Limit salt (sodium) to 2000 mg per day.  4) Stay as active as you can everyday 5) Limit all fluids for the day to less than 2 liters 6)

## 2016-06-11 NOTE — Progress Notes (Signed)
Advanced Heart Failure Medication Review by a Pharmacist  Does the patient  feel that his/her medications are working for him/her?  yes  Has the patient been experiencing any side effects to the medications prescribed?  no  Does the patient measure his/her own blood pressure or blood glucose at home?  Yes, sometimes. She states it is usually around 130s/80s  Does the patient have any problems obtaining medications due to transportation or finances?   No, but was given eliquis samples today in clinic   Understanding of regimen: good Understanding of indications: good Potential of compliance: good Patient understands to avoid NSAIDs. Patient understands to avoid decongestants.   Pharmacist comments: Ms. Funke is a pleasant 56 yof presenting to clinic for a follow-up appointment. Today she states that she is feeling well. She brought a copy of her medication and allergy list with her. She denies any medication-related side effects at this time and also denies any swelling, dizziness, or SOB. She did not have any medication-related questions at this time.   Quinlan Mcfall C. Lennox Grumbles, PharmD Pharmacy Resident  Pager: [phone] 06/11/2016 11:21 AM   Time with patient: 8 min    Preparation and documentation time: 2 min  Total time: 10 min

## 2016-06-11 NOTE — Addendum Note (Signed)
Encounter addended by: Kerry Dory, CMA on: 06/11/2016 11:54 AM<BR>     Documentation filed: Patient Instructions Section

## 2016-06-11 NOTE — Progress Notes (Signed)
Medication Samples have been provided to the patient.  Drug name: Eliquis       Strength: 5 mg        Qty: 4   LOT: [id number]  Exp.Date: 08/2018  Dosing instructions: 1 tab Twice daily   The patient has been instructed regarding the correct time, dose, and frequency of taking this medication, including desired effects and most common side effects.   [name] 11:14 AM 06/11/2016

## 2016-06-11 NOTE — Progress Notes (Signed)
Patient ID: [name], female   DOB: [birth year], 82 y.o.   MRN: [id number]   ADVANCED HF CLINIC NOTE  Patient ID: [name], female   DOB: Apr 28, 1931, 82 y.o.   MRN: [id number]  Referring Physician: Lovena Le Primary Care: Billey Gosling Primary Cardiologist:Taylor  HPI: [name] is a pleasant 82 yo woman with CAD, HTN, atrial fibrillation/A flutter and chronic prior systolic heart failure, (which was likely rate related) and diastolic heart failure. She was referred by Dr. Lovena Le for further evaluation of Pulmonary HTN.   Underwent 2D echo in 3/15 which showed EF 60-65% mild LVH. RV was normal. No significant valvular abnormalities.   Underwent R/L cath 05/07/14 Which showed stable CAD and significant PH  RA 6 RV 71/7 6 PA 63/17 (33)  PCWP 19 mm Hg  LV 174/16 mm Hg  AO 171/62 mean 103 mm Hg  PVR = 2.86 Oxygen saturations:  PA 69%  AO 99%  Cardiac Output/Index (Fick) 4.88/2.7    Left mainstem: Normal.  Left anterior descending (LAD): Moderate calcification proximally. Mild irregularities less than 10%. The first diagonal has 40-50% ostial disease.  Left circumflex (LCx): The LCx gives rise to a large OM1 then terminates in the AV groove. There is 20% disease in the proximal LCx. The first OM stent is patent with diffuse 20% disease.  Right coronary artery (RCA): The RCA arises anteriorly. There is an eccentric slit like stenosis in the proximal vessel to 60-70%. The mid vessel has segmental 70-80% disease.  Left ventriculography: Left ventricular systolic function is normal, LVEF is estimated at 55-65%, there is no significant mitral regurgitation   We saw her for an initial visit in May 2105 for pHTN and DOE. At that time she was quite dyspneic with activity. HR was in 40-50 range on carvedilol 50 bid and digoxin. We stopped her digoxin and cut carvedilol back to 25 bid. PFTs, VQ and CXR were ordered. I also asked her to decrease her H2O intake. We saw her back last  month and she was feeling much better. Given results of PFTs overnight oximeter was placed and referred to Dr. Gwenette Greet. She has had overnight oximetry which showed oxygen desaturation as low as 83%, and spent 97 minutes less than 88% during the night.   Follow-up:  Here for routine f/u. Feeling ok. Doing all ADLs without problem. BP well controlled. Pending right knee replacement on July 3.  No edema, orthopnea or PND. Taking lasix 80mg  daily. No need to take extra. Tries not weight herself. Goes to [employer] for [employer] and elliptical - 3 days per week. No bleeding with Eliquis.   Results:  05/21/14: VQ normal 5/15: CXR: 3/16: Echo 3/16 EF 60-65% RV normal. Mild TR No RVSP measured   Emphysema. Otherwise normal.  5/15: PFTs FEV1  1.4 (95%) FVC    1.89 (110%) FEV/FVC 88% FEF 25-75%  66% DLCO 39%  BP rest: 138/58 BP peak: 182/66 Peak VO2: 8.9 (69.1% predicted peak VO2) VE/VCO2 slope: 34.6 OUES: Peak RER: 1.24 Ventilatory Threshold: 5.8 (50% predicted and 65% measured peak VO2) Peak RR 38 Peak Ventilation: 32.7 VE/MVV: 71%  PETCO2 at peak: 36 O2pulse: 8 (100% predicted O2pulse)     Labs  05/23/15  Creatinine 1.2 05/31/15    Creatinine 1.65    Current Outpatient Prescriptions  Medication Sig Dispense Refill  . apixaban (ELIQUIS) 5 MG TABS tablet Take 1 tablet (5 mg total) by mouth 2 (two) times daily. 180 tablet 3  .  carvedilol (COREG) 12.5 MG tablet Take 1 tablet (12.5 mg total) by mouth 2 (two) times daily with a meal. 180 tablet 3  . furosemide (LASIX) 40 MG tablet Take 2 tablets (80 mg total) by mouth daily. 180 tablet 3  . glucose blood (ONE TOUCH TEST STRIPS) test strip Use to check blood sugar 2 times per day. Dx Code E11.9 (ONE TOUCH VERIO) 200 each 2  . insulin NPH Human (HUMULIN N,NOVOLIN N) 100 UNIT/ML injection Inject 40 Units into the skin daily.     . Lancets (ONETOUCH ULTRASOFT) lancets Use to check blood sugar 2 times per day. Dx Code E11.9 200 each 2    . methimazole (TAPAZOLE) 5 MG tablet Take 5 mg by mouth 2 (two) times a week.    . Multiple Vitamins-Minerals (CENTRUM SILVER ULTRA WOMENS PO) Take by mouth.    . nitroGLYCERIN (NITROSTAT) 0.4 MG SL tablet Place 0.4 mg under the tongue every 5 (five) minutes as needed for chest pain. Reported on 01/28/2016    . potassium chloride SA (K-DUR,KLOR-CON) 20 MEQ tablet Take 20-40 mEq by mouth daily. Take 1 tablet (45mEq) daily alternating with 2 tablets (83mEq) daily)    . tiotropium (SPIRIVA) 18 MCG inhalation capsule Place 1 capsule (18 mcg total) into inhaler and inhale daily. 90 capsule 2  . TURMERIC PO Take 1 tablet by mouth daily.     . Vitamin D, Cholecalciferol, 1000 units TABS Take 2,000 Units by mouth daily.      No current facility-administered medications for this encounter.    Allergies  Allergen Reactions  . Amlodipine Besylate Other (See Comments)    REACTION: tingling in lips & gum edema  . Levaquin [Levofloxacin] Shortness Of Breath and Swelling    angioedema  . Lobster [Shellfish Allergy] Other (See Comments)    angioedema  . Penicillins Rash  . Valsartan Other (See Comments)    REACTION: angioedema  . Codeine Other (See Comments)    Mental status changes  . Tramadol Hcl Nausea And Vomiting   PHYSICAL EXAM: Filed Vitals:   06/11/16 1107  BP: 136/68  Pulse: 85  Weight: 178 lb 4 oz (80.854 kg)  SpO2: 95%    General:  Looks younger than stated age. Well appearing. No respiratory difficulty HEENT: normal Neck: supple. JVP 5-6 Carotids 2+ bilat; no bruits. No lymphadenopathy or thryomegaly appreciated. Cor: PMI nondisplaced.  Irregular rate & rhythm. No rubs, gallops or murmurs. Lungs: clear with diminished BS throughout. No wheeze Abdomen: soft, nontender, nondistended. No hepatosplenomegaly. No bruits or masses. Good bowel sounds. Extremities: no cyanosis, clubbing, rash, no edema Neuro: alert & oriented x 3, cranial nerves grossly intact. moves all 4 extremities  w/o difficulty. Affect pleasant.   ASSESSMENT & PLAN: 1) Mild pulmonary HTN  2) Chronic diastolic HF, NYHA II 3) Overweight 4) Chronic atrial fib/flutter    This patients CHA2DS2-VASc Score and unadjusted Ischemic Stroke Rate (% per year) is equal to 7.2 % stroke rate/year from a score of 5  Above score calculated as 1 point each if present [CHF, HTN, DM, Vascular=MI/PAD/Aortic Plaque, Age if 65-74, or Female] Above score calculated as 2 points each if present [Age > 75, or Stroke/TIA/TE]  5) Bradycardia - resolved 6) OSA 7) HTN - well controlled  Doing very well. Volume status looks great. BP well controlled. Continue current regimen. Given good exercise capacity she is at low to moderate risk for peri-op risk for CV complications.(Having spinal and not GA).  Can hold Eliquis for  surgery without a bridge. Please restart as soon as possible post-op.   RTC in 4 months.   Jahnay Lantier,MD 11:40 AM

## 2016-06-12 ENCOUNTER — Encounter: Payer: Self-pay | Admitting: Internal Medicine

## 2016-06-15 ENCOUNTER — Other Ambulatory Visit: Payer: Self-pay | Admitting: Orthopedic Surgery

## 2016-06-17 DIAGNOSIS — L920 Granuloma annulare: Secondary | ICD-10-CM | POA: Diagnosis not present

## 2016-06-17 DIAGNOSIS — L821 Other seborrheic keratosis: Secondary | ICD-10-CM | POA: Diagnosis not present

## 2016-06-17 DIAGNOSIS — L723 Sebaceous cyst: Secondary | ICD-10-CM | POA: Diagnosis not present

## 2016-06-17 DIAGNOSIS — L309 Dermatitis, unspecified: Secondary | ICD-10-CM | POA: Diagnosis not present

## 2016-06-17 NOTE — Pre-Procedure Instructions (Signed)
[name]  06/17/2016      [facility] - [place], [place] - [address] [place] [address] Phone: [phone] Fax: [phone]    Your procedure is scheduled on 06/28/16.  Report to [facility] Admitting at 7 A.M.  Call this number if you have problems the morning of surgery:  [phone]   Remember:  Do not eat food or drink liquids after midnight.  Take these medicines the morning of surgery with A SIP OF WATER --carvedilol,all inhalers Do not take any aspirin,anti-inflammatories,vitamins,or herbal supplements 5-7 days prior to surgery.  How to Manage Your Diabetes Before and After Surgery  Why is it important to control my blood sugar before and after surgery? . Improving blood sugar levels before and after surgery helps healing and can limit problems. . A way of improving blood sugar control is eating a healthy diet by: o  Eating less sugar and carbohydrates o  Increasing activity/exercise o  Talking with your doctor about reaching your blood sugar goals . High blood sugars (greater than 180 mg/dL) can raise your risk of infections and slow your recovery, so you will need to focus on controlling your diabetes during the weeks before surgery. . Make sure that the doctor who takes care of your diabetes knows about your planned surgery including the date and location.  How do I manage my blood sugar before surgery? . Check your blood sugar at least 4 times a day, starting 2 days before surgery, to make sure that the level is not too high or low. o Check your blood sugar the morning of your surgery when you wake up and every 2 hours until you get to the Short Stay unit. . If your blood sugar is less than 70 mg/dL, you will need to treat for low blood sugar: o Do not take insulin. o Treat a low blood sugar (less than 70 mg/dL) with  cup of clear juice (cranberry or apple), 4 glucose tablets, OR glucose  gel. o Recheck blood sugar in 15 minutes after treatment (to make sure it is greater than 70 mg/dL). If your blood sugar is not greater than 70 mg/dL on recheck, call [phone] for further instructions. . Report your blood sugar to the short stay nurse when you get to Short Stay.  . If you are admitted to the hospital after surgery: o Your blood sugar will be checked by the staff and you will probably be given insulin after surgery (instead of oral diabetes medicines) to make sure you have good blood sugar levels. o The goal for blood sugar control after surgery is 80-180 mg/dL.              WHAT DO I DO ABOUT MY DIABETES MEDICATION?   Marland Kitchen Do not take oral diabetes medicines (pills) the morning of surgery.  . THE NIGHT BEFORE SURGERY, take ___________ units of ___________insulin.       Marland Kitchen HE MORNING OF SURGERY, take _____________ units of __________insulin.  . The day of surgery, do not take other diabetes injectables, including Byetta (exenatide), Bydureon (exenatide ER), Victoza (liraglutide), or Trulicity (dulaglutide).  . If your CBG is greater than 220 mg/dL, you may take  of your sliding scale (correction) dose of insulin.  Other Instructions:          Patient Signature:  Date:   Nurse Signature:  Date:   Reviewed and Endorsed by [facility] Patient Education Committee, August 2015  Do not wear jewelry, make-up or nail polish.  Do not wear lotions, powders, or perfumes.  You may wear deoderant.  Do not shave 48 hours prior to surgery.  Men may shave face and neck.  Do not bring valuables to the hospital.  [facility] is not responsible for any belongings or valuables.  Contacts, dentures or bridgework may not be worn into surgery.  Leave your suitcase in the car.  After surgery it may be brought to your room.  For patients admitted to the hospital, discharge time will be determined by your treatment team.  Patients discharged the day of surgery will not  be allowed to drive home.   Name and phone number of your driver:   Special instructions:   Please read over the following fact sheets that you were given. MRSA Information

## 2016-06-18 ENCOUNTER — Encounter (HOSPITAL_COMMUNITY)
Admission: RE | Admit: 2016-06-18 | Discharge: 2016-06-18 | Disposition: A | Discharge status: Home / Self Care | Payer: Medicare Other | Source: Ambulatory Visit | Attending: Orthopedic Surgery | Admitting: Orthopedic Surgery

## 2016-06-18 ENCOUNTER — Telehealth: Payer: Self-pay | Admitting: Internal Medicine

## 2016-06-18 ENCOUNTER — Encounter (HOSPITAL_COMMUNITY): Payer: Self-pay

## 2016-06-18 DIAGNOSIS — I5032 Chronic diastolic (congestive) heart failure: Secondary | ICD-10-CM | POA: Insufficient documentation

## 2016-06-18 DIAGNOSIS — I252 Old myocardial infarction: Secondary | ICD-10-CM | POA: Insufficient documentation

## 2016-06-18 DIAGNOSIS — Z01812 Encounter for preprocedural laboratory examination: Secondary | ICD-10-CM | POA: Insufficient documentation

## 2016-06-18 DIAGNOSIS — Z01818 Encounter for other preprocedural examination: Secondary | ICD-10-CM | POA: Diagnosis not present

## 2016-06-18 DIAGNOSIS — I110 Hypertensive heart disease with heart failure: Secondary | ICD-10-CM | POA: Diagnosis not present

## 2016-06-18 DIAGNOSIS — Z0183 Encounter for blood typing: Secondary | ICD-10-CM | POA: Diagnosis not present

## 2016-06-18 DIAGNOSIS — I2510 Atherosclerotic heart disease of native coronary artery without angina pectoris: Secondary | ICD-10-CM | POA: Insufficient documentation

## 2016-06-18 DIAGNOSIS — G4733 Obstructive sleep apnea (adult) (pediatric): Secondary | ICD-10-CM | POA: Diagnosis not present

## 2016-06-18 DIAGNOSIS — M1711 Unilateral primary osteoarthritis, right knee: Secondary | ICD-10-CM | POA: Diagnosis not present

## 2016-06-18 DIAGNOSIS — I4891 Unspecified atrial fibrillation: Secondary | ICD-10-CM | POA: Insufficient documentation

## 2016-06-18 DIAGNOSIS — E119 Type 2 diabetes mellitus without complications: Secondary | ICD-10-CM | POA: Insufficient documentation

## 2016-06-18 DIAGNOSIS — E785 Hyperlipidemia, unspecified: Secondary | ICD-10-CM | POA: Diagnosis not present

## 2016-06-18 DIAGNOSIS — Z955 Presence of coronary angioplasty implant and graft: Secondary | ICD-10-CM | POA: Diagnosis not present

## 2016-06-18 DIAGNOSIS — Z794 Long term (current) use of insulin: Secondary | ICD-10-CM | POA: Diagnosis not present

## 2016-06-18 DIAGNOSIS — Z87891 Personal history of nicotine dependence: Secondary | ICD-10-CM | POA: Diagnosis not present

## 2016-06-18 DIAGNOSIS — Z7901 Long term (current) use of anticoagulants: Secondary | ICD-10-CM | POA: Insufficient documentation

## 2016-06-18 DIAGNOSIS — Z79899 Other long term (current) drug therapy: Secondary | ICD-10-CM | POA: Diagnosis not present

## 2016-06-18 DIAGNOSIS — J449 Chronic obstructive pulmonary disease, unspecified: Secondary | ICD-10-CM | POA: Diagnosis not present

## 2016-06-18 HISTORY — DX: Rash and other nonspecific skin eruption: R21

## 2016-06-18 HISTORY — DX: Reserved for inherently not codable concepts without codable children: IMO0001

## 2016-06-18 LAB — CBC WITH DIFFERENTIAL/PLATELET
Basophils Absolute: 0 10*3/uL (ref 0.0–0.1)
Basophils Relative: 0 %
EOS PCT: 8 %
Eosinophils Absolute: 0.4 10*3/uL (ref 0.0–0.7)
HCT: 45.1 % (ref 36.0–46.0)
Hemoglobin: 14.3 g/dL (ref 12.0–15.0)
LYMPHS ABS: 1.2 10*3/uL (ref 0.7–4.0)
LYMPHS PCT: 27 %
MCH: 29.3 pg (ref 26.0–34.0)
MCHC: 31.7 g/dL (ref 30.0–36.0)
MCV: 92.4 fL (ref 78.0–100.0)
MONO ABS: 0.5 10*3/uL (ref 0.1–1.0)
Monocytes Relative: 11 %
NEUTROS ABS: 2.5 10*3/uL (ref 1.7–7.7)
NEUTROS PCT: 54 %
PLATELETS: 202 10*3/uL (ref 150–400)
RBC: 4.88 MIL/uL (ref 3.87–5.11)
RDW: 15.3 % (ref 11.5–15.5)
WBC: 4.6 10*3/uL (ref 4.0–10.5)

## 2016-06-18 LAB — URINALYSIS, ROUTINE W REFLEX MICROSCOPIC
BILIRUBIN URINE: NEGATIVE
Glucose, UA: 500 mg/dL — AB
Hgb urine dipstick: NEGATIVE
Ketones, ur: NEGATIVE mg/dL
Leukocytes, UA: NEGATIVE
NITRITE: NEGATIVE
PROTEIN: NEGATIVE mg/dL
SPECIFIC GRAVITY, URINE: 1.013 (ref 1.005–1.030)
pH: 6.5 (ref 5.0–8.0)

## 2016-06-18 LAB — BASIC METABOLIC PANEL
ANION GAP: 8 (ref 5–15)
BUN: 15 mg/dL (ref 6–20)
CALCIUM: 9.9 mg/dL (ref 8.9–10.3)
CO2: 27 mmol/L (ref 22–32)
CREATININE: 1.14 mg/dL — AB (ref 0.44–1.00)
Chloride: 101 mmol/L (ref 101–111)
GFR, EST AFRICAN AMERICAN: 50 mL/min — AB (ref >60)
GFR, EST NON AFRICAN AMERICAN: 44 mL/min — AB (ref >60)
Glucose, Bld: 301 mg/dL — ABNORMAL HIGH (ref 65–99)
Potassium: 4.4 mmol/L (ref 3.5–5.1)
SODIUM: 136 mmol/L (ref 135–145)

## 2016-06-18 LAB — GLUCOSE, CAPILLARY: Glucose-Capillary: 348 mg/dL — ABNORMAL HIGH (ref 65–99)

## 2016-06-18 LAB — TYPE AND SCREEN
ABO/RH(D): A POS
ANTIBODY SCREEN: NEGATIVE

## 2016-06-18 LAB — PROTIME-INR
INR: 1.65 — ABNORMAL HIGH (ref 0.00–1.49)
Prothrombin Time: 19.5 seconds — ABNORMAL HIGH (ref 11.6–15.2)

## 2016-06-18 LAB — ABO/RH: ABO/RH(D): A POS

## 2016-06-18 LAB — APTT: APTT: 37 s (ref 24–37)

## 2016-06-18 LAB — SURGICAL PCR SCREEN
MRSA, PCR: NEGATIVE
STAPHYLOCOCCUS AUREUS: POSITIVE — AB

## 2016-06-18 NOTE — Progress Notes (Signed)
Mupirocin Ointment Rx called into [facility] on [address] for positive PCR of Staph. Pt notified and voiced understanding.  

## 2016-06-18 NOTE — Telephone Encounter (Signed)
Pt called in and said that she is having a knee replacement surgery in a couple of weeks and she went to dr today and her sugar was high.  Her surgeon told her that she need to get a fasting lab for her sugar next thur.  Does she need to make an appt or can she just come in for the lab?

## 2016-06-18 NOTE — Progress Notes (Signed)
Skin rash both arms, awaiting bio. Per DR Delman Cheadle    Will await F/U

## 2016-06-21 NOTE — Telephone Encounter (Signed)
Can just go to lab

## 2016-06-21 NOTE — Telephone Encounter (Signed)
Please advise 

## 2016-06-21 NOTE — Progress Notes (Signed)
Anesthesia Chart Review:  Pt is an 82 year old female scheduled for R total knee arthroplasty on 06/28/2016 with Frederik Pear.   PCP is Billey Gosling, MD. Cardiologist is Glori Bickers, MD who cleared pt at low to moderate risk for surgery. Endocrinologist is Renato Shin, MD. Pulmonologist is Chesley Mires, MD who notes 04/23/16 that pt is at increased risk for perioperative pulmonary complications.   PMH includes:  CAD (BMS to OM1 2008), MI, atrial fibrillation, chronic diastolic HF, heart murmur, HTN, DM, hyperlipidemia, hyperthyroidism, COPD, OSA, anemia, asthma. Former smoker. BMI 31  Medications include: eliquis, carvedilol, lasix, NPH insulin, methimazole, potassium, spiriva. Kathy from Dr. Damita Dunnings office to notify pt to stop eliquis 3 days prior to surgery.   Preoperative labs reviewed.   - Glucose 301. HgbA1c was 8.4 on 06/09/16 - PT 19.5. Will recheck DOS.   Chest x-ray 08/19/15 reviewed. Atelectasis versus airspace disease within the posterior left lower lobe. Possible small effusion. Cardiomegaly.  EKG 06/18/16: Atrial flutter with variable A-V block (77 bpm). Left axis deviation. Incomplete RBBB. Left ventricular hypertrophy with repolarization abnormality  Echo 03/04/15:  - Left ventricle: The cavity size was normal. Systolic function was normal. The estimated ejection fraction was in the range of 60% to 65%. Wall motion was normal; there were no regional wall motion abnormalities. - Aortic valve: Trileaflet; normal thickness, mildly calcified leaflets. - Mitral valve: Calcified annulus. There was mild regurgitation. - Left atrium: The atrium was moderately to severely dilated.  Cardiopulmonary exercise test 02/20/15:  - Exercise testing with gas exchange demonstrates a mildly reduced functional capacity when matched to sedentary norms. The limitation appears multifactorial in nature. At peak exercise, the primary limitation seems ventilatory and the patient has a mild to moderate  obstructive/restrictive pattern on resting PFTs. The patient's blunted HR response to exercise and obesity also seem to be affecting her exercise capacity. Finally, the moderately elevated Ve/VCO2 slope can be reflective of diastolic dysfunction.    R and L cardiac cath 05/07/14:  - RA 6 - RV 71/7 6 - PA 63/17 (33)  -PCWP 19 mm Hg  - LV 174/16 mm Hg  - AO 171/62 mean 103 mm Hg  - PVR = 2.86 - Oxygen saturations:  - PA 69%  - AO 99%  - Cardiac Output/Index (Fick) 4.88/2.7   - Left mainstem: Normal.  - LAD: less than 10% irregularities. D1 has 40-50% ostial disease.  - LCx : 20% disease in the proximal LCx. The first OM stent is patent with diffuse 20% disease.  - RCA: There is an eccentric slit like stenosis in the proximal vessel to 60-70%. The mid vessel has segmental 70-80% disease.  - Left ventriculography: Left ventricular systolic function is normal, LVEF is estimated at 55-65%, there is no significant mitral regurgitation   If labs acceptable DOS, I anticipate pt can proceed with surgery as scheduled.   Willeen Cass, FNP-BC [facility]/Anesthesiology Phone: [phone] 06/21/2016 2:29 PM

## 2016-06-21 NOTE — Telephone Encounter (Signed)
LVM for pt to call back.

## 2016-06-22 NOTE — Telephone Encounter (Signed)
Patient called back about this. I informed her of the notes on the other encounter.

## 2016-06-23 NOTE — Telephone Encounter (Signed)
Patient went in for pre admission blood work.

## 2016-06-25 NOTE — H&P (Signed)
TOTAL KNEE ADMISSION H&P  Patient is being admitted for right total knee arthroplasty.  Subjective:  Chief Complaint:right knee pain.  HPI: [name], 82 y.o. female, has a history of pain and functional disability in the right knee due to arthritis and has failed non-surgical conservative treatments for greater than 12 weeks to includeNSAID's and/or analgesics, corticosteriod injections, viscosupplementation injections, use of assistive devices and activity modification.  Onset of symptoms was gradual, starting 1 years ago with gradually worsening course since that time. The patient noted no past surgery on the right knee(s).  Patient currently rates pain in the right knee(s) at 10 out of 10 with activity. Patient has night pain, worsening of pain with activity and weight bearing, pain that interferes with activities of daily living, pain with passive range of motion, crepitus and joint swelling.  Patient has evidence of periarticular osteophytes and joint space narrowing by imaging studies.  There is no active infection.  Patient Active Problem List   Diagnosis Date Noted  . Pre-operative cardiovascular examination 06/11/2016  . Rash and nonspecific skin eruption 06/09/2016  . Pain of left lower extremity 06/09/2016  . Diabetes ([place]) 03/07/2016  . Acute encephalopathy   . Acute renal failure superimposed on stage 3 chronic kidney disease ([place])   . Acute on chronic diastolic CHF (congestive heart failure) ([place])   . Acute respiratory failure with hypoxia ([place]) 05/24/2015  . CAP (community acquired pneumonia) 05/24/2015  . Abnormal urinalysis 05/24/2015  . Sepsis ([place]) 05/24/2015  . OSA on CPAP 08/28/2014  . DJD (degenerative joint disease) of knee 08/16/2014  . Chronic diastolic heart failure ([place]) 07/30/2014  . Nocturnal hypoxemia 07/09/2014  . Dyspnea 05/15/2014  . Pulmonary HTN ([place]) 05/15/2014  . Crescendo angina ([place]) 05/02/2014  . Hyperthyroidism 11/04/2011  .  Multinodular goiter 10/14/2011  . LUMBAR RADICULOPATHY, RIGHT 09/23/2010  . ATRIAL FIBRILLATION 06/03/2010  . CHF 02/17/2010  . VITILIGO 11/07/2009  . SINOATRIAL NODE DYSFUNCTION 06/16/2009  . Iron deficiency anemia 01/02/2009  . ANEMIA, PERNICIOUS 11/20/2008  . DEGENERATIVE JOINT DISEASE 11/20/2008  . Atrial flutter ([place]) 06/25/2008  . HYPERLIPIDEMIA 01/29/2008  . Coronary atherosclerosis 01/29/2008  . HTN (hypertension) 10/30/2007   Past Medical History  Diagnosis Date  . Hypopotassemia     PMH of  . Anemia     iron defficiency  . Muscle pain   . Atrial fibrillation or flutter     s/p RFCA 7/08;   s/p DCCV in past;   previously on amiodarone;  amio stopped due to lung toxicity  . CAD (coronary artery disease)     s/p NSTEMI tx with BMS to OM1 3/08;  cath 3/08: pOM 99% tx with PCI, pLAD 20%, ? mod stenosis at the AM  . HLD (hyperlipidemia)   . HTN (hypertension)     essential nos  . Dystrophy, corneal stromal   . Chronic diastolic heart failure ([place])     echo 11/11:  EF 55-60%, severe LVH, mod LAE, mild MR, mildly increased PASP  . Osteoporosis   . Heart murmur   . Anginal pain ([place])   . Myocardial infarction ([place]) 2008  . COPD (chronic obstructive pulmonary disease) ([place])   . Asthma   . Pneumonia 05/27/2015  . OSA on CPAP   . Type II diabetes mellitus ([facility])     Dr Loanne Drilling  . Seasonal allergies   . Degenerative joint disease   . Arthritis     "knees, feet, hands; joints" (05/27/2015)  . Shortness of breath  dyspnea   . Rash and nonspecific skin eruption     both arms,awaiting bio   dr Delman Cheadle    Past Surgical History  Procedure Laterality Date  . Cholecystectomy    . Knee cartilage surgery Right   . Eye surgery    . A flutter ablation      Dr Lovena Le  . Gravid 2 para 2    . Colonoscopy  6/07    2 polyps Dr. Kinnie Feil in [place]  . Coronary angioplasty with stent placement  02/2007    BMS w/Dr Lovena Le  . Colonoscopy with polypectomy       X 2; [facility]  . Left and  right heart catheterization with coronary angiogram N/A 05/07/2014    Procedure: LEFT AND RIGHT HEART CATHETERIZATION WITH CORONARY ANGIOGRAM;  Surgeon: Peter M Martinique, MD;  Location: [facility] CATH LAB;  Service: Cardiovascular;  Laterality: N/A;  . Cataract extraction w/ intraocular lens  implant, bilateral Bilateral   . Corneal transplant Bilateral     No prescriptions prior to admission   Allergies  Allergen Reactions  . Amlodipine Besylate Other (See Comments)    REACTION: tingling in lips & gum edema  . Levaquin [Levofloxacin] Shortness Of Breath and Swelling    angioedema  . Lobster [Shellfish Allergy] Other (See Comments)    angioedema  . Penicillins Rash  . Valsartan Other (See Comments)    REACTION: angioedema  . Codeine Other (See Comments)    Mental status changes  . Tramadol Hcl Nausea And Vomiting    Social History  Substance Use Topics  . Smoking status: Former Smoker -- 0.25 packs/day for 18 years    Types: Cigarettes    Quit date: 12/27/1968  . Smokeless tobacco: Never Used     Comment: smoked [place], up to 1 pp week  . Alcohol Use: Yes     Comment: occ    Family History  Problem Relation Age of Onset  . Diabetes Mother   . Hypertension Mother   . Transient ischemic attack Mother   . Heart attack Father 2  . Arthritis Father   . Breast cancer Maternal Aunt   . Arthritis Maternal Aunt   . Arthritis Mother   . Hypertension Father      Review of Systems  Constitutional: Negative.   HENT: Negative.   Eyes: Negative.   Respiratory: Positive for shortness of breath.   Cardiovascular: Positive for leg swelling.       Heart attack, HTN, irregular heart beat  Gastrointestinal: Negative.   Genitourinary: Positive for urgency.  Musculoskeletal: Positive for myalgias and joint pain.       RA  Skin: Positive for rash.  Neurological: Negative.   Endo/Heme/Allergies: Bruises/bleeds easily.       Over active thyroid  Psychiatric/Behavioral: Negative.      Objective:  Physical Exam  Constitutional: She is oriented to person, place, and time. She appears well-developed and well-nourished.  HENT:  Head: Normocephalic and atraumatic.  Eyes: Pupils are equal, round, and reactive to light.  Neck: Normal range of motion. Neck supple.  Cardiovascular: Intact distal pulses.   Respiratory: Effort normal.  Musculoskeletal:  the patient's left knee has a range from 5 to 115.  Patient's right knee has a similar range of motion from 5 to 115.  Tenderness over the medial joint line bilaterally.  No noticeable effusion.  Obvious crepitus with range of motion.  Her calves are soft and nontender.  She is neurovascularly intact distally.  Neurological: She is alert and oriented to person, place, and time.  Skin: Skin is warm and dry.  Psychiatric: She has a normal mood and affect. Her behavior is normal. Judgment and thought content normal.    Vital signs in last 24 hours:    Labs:   Estimated body mass index is 31.43 kg/(m^2) as calculated from the following:   Height as of 04/23/16: 5\' 3"  (1.6 m).   Weight as of 04/23/16: 80.468 kg (177 lb 6.4 oz).   Imaging Review Plain radiographs demonstrate  bilateral AP weightbearing, bilateral Rosenberg, lateral and sunrise views of the right knee are taken and reviewed in office today.  Patient's right knee does have end-stage bone-on-bone arthritis and severe patellofemoral arthritis.  Patient's left knee also has end-stage arthritis of the medial compartment.  Assessment/Plan:  End stage arthritis, right knee   The patient history, physical examination, clinical judgment of the provider and imaging studies are consistent with end stage degenerative joint disease of the right knee(s) and total knee arthroplasty is deemed medically necessary. The treatment options including medical management, injection therapy arthroscopy and arthroplasty were discussed at length. The risks and benefits of total knee  arthroplasty were presented and reviewed. The risks due to aseptic loosening, infection, stiffness, patella tracking problems, thromboembolic complications and other imponderables were discussed. The patient acknowledged the explanation, agreed to proceed with the plan and consent was signed. Patient is being admitted for inpatient treatment for surgery, pain control, PT, OT, prophylactic antibiotics, VTE prophylaxis, progressive ambulation and ADL's and discharge planning. The patient is planning to be discharged to skilled nursing facility

## 2016-06-27 DIAGNOSIS — M1711 Unilateral primary osteoarthritis, right knee: Secondary | ICD-10-CM | POA: Diagnosis present

## 2016-06-27 MED ORDER — VANCOMYCIN HCL IN DEXTROSE 1-5 GM/200ML-% IV SOLN
1000.0000 mg | INTRAVENOUS | Status: AC
Start: 2016-06-28 — End: 2016-06-28
  Administered 2016-06-28: 1000 mg via INTRAVENOUS
  Filled 2016-06-27: qty 200

## 2016-06-28 ENCOUNTER — Encounter (HOSPITAL_COMMUNITY): Payer: Self-pay | Admitting: Urology

## 2016-06-28 ENCOUNTER — Inpatient Hospital Stay (HOSPITAL_COMMUNITY): Payer: Medicare Other | Admitting: Emergency Medicine

## 2016-06-28 ENCOUNTER — Inpatient Hospital Stay (HOSPITAL_COMMUNITY)
Admission: RE | Admit: 2016-06-28 | Discharge: 2016-06-30 | DRG: 470 | Disposition: A | Discharge status: Skilled Nursing Facility | Payer: Medicare Other | Source: Ambulatory Visit | Attending: Orthopedic Surgery | Admitting: Orthopedic Surgery

## 2016-06-28 ENCOUNTER — Encounter (HOSPITAL_COMMUNITY)
Admission: RE | Disposition: A | Discharge status: Skilled Nursing Facility | Payer: Self-pay | Source: Ambulatory Visit | Attending: Orthopedic Surgery

## 2016-06-28 DIAGNOSIS — E785 Hyperlipidemia, unspecified: Secondary | ICD-10-CM | POA: Diagnosis present

## 2016-06-28 DIAGNOSIS — I272 Other secondary pulmonary hypertension: Secondary | ICD-10-CM | POA: Diagnosis not present

## 2016-06-28 DIAGNOSIS — Z8249 Family history of ischemic heart disease and other diseases of the circulatory system: Secondary | ICD-10-CM

## 2016-06-28 DIAGNOSIS — I5032 Chronic diastolic (congestive) heart failure: Secondary | ICD-10-CM | POA: Diagnosis present

## 2016-06-28 DIAGNOSIS — M810 Age-related osteoporosis without current pathological fracture: Secondary | ICD-10-CM | POA: Diagnosis not present

## 2016-06-28 DIAGNOSIS — Z471 Aftercare following joint replacement surgery: Secondary | ICD-10-CM | POA: Diagnosis not present

## 2016-06-28 DIAGNOSIS — J449 Chronic obstructive pulmonary disease, unspecified: Secondary | ICD-10-CM | POA: Diagnosis not present

## 2016-06-28 DIAGNOSIS — I4891 Unspecified atrial fibrillation: Secondary | ICD-10-CM | POA: Diagnosis not present

## 2016-06-28 DIAGNOSIS — N183 Chronic kidney disease, stage 3 (moderate): Secondary | ICD-10-CM | POA: Diagnosis not present

## 2016-06-28 DIAGNOSIS — M25569 Pain in unspecified knee: Secondary | ICD-10-CM | POA: Diagnosis not present

## 2016-06-28 DIAGNOSIS — Z87891 Personal history of nicotine dependence: Secondary | ICD-10-CM

## 2016-06-28 DIAGNOSIS — M1711 Unilateral primary osteoarthritis, right knee: Principal | ICD-10-CM | POA: Diagnosis present

## 2016-06-28 DIAGNOSIS — I130 Hypertensive heart and chronic kidney disease with heart failure and stage 1 through stage 4 chronic kidney disease, or unspecified chronic kidney disease: Secondary | ICD-10-CM | POA: Diagnosis present

## 2016-06-28 DIAGNOSIS — Z96651 Presence of right artificial knee joint: Secondary | ICD-10-CM | POA: Diagnosis not present

## 2016-06-28 DIAGNOSIS — I252 Old myocardial infarction: Secondary | ICD-10-CM

## 2016-06-28 DIAGNOSIS — G4733 Obstructive sleep apnea (adult) (pediatric): Secondary | ICD-10-CM | POA: Diagnosis not present

## 2016-06-28 DIAGNOSIS — E1122 Type 2 diabetes mellitus with diabetic chronic kidney disease: Secondary | ICD-10-CM | POA: Diagnosis not present

## 2016-06-28 DIAGNOSIS — Z8261 Family history of arthritis: Secondary | ICD-10-CM | POA: Diagnosis not present

## 2016-06-28 DIAGNOSIS — Z833 Family history of diabetes mellitus: Secondary | ICD-10-CM | POA: Diagnosis not present

## 2016-06-28 DIAGNOSIS — Z955 Presence of coronary angioplasty implant and graft: Secondary | ICD-10-CM | POA: Diagnosis not present

## 2016-06-28 DIAGNOSIS — M25561 Pain in right knee: Secondary | ICD-10-CM | POA: Diagnosis present

## 2016-06-28 DIAGNOSIS — M179 Osteoarthritis of knee, unspecified: Secondary | ICD-10-CM | POA: Diagnosis not present

## 2016-06-28 DIAGNOSIS — G8918 Other acute postprocedural pain: Secondary | ICD-10-CM | POA: Diagnosis not present

## 2016-06-28 DIAGNOSIS — Z7901 Long term (current) use of anticoagulants: Secondary | ICD-10-CM

## 2016-06-28 DIAGNOSIS — Z803 Family history of malignant neoplasm of breast: Secondary | ICD-10-CM

## 2016-06-28 DIAGNOSIS — I2510 Atherosclerotic heart disease of native coronary artery without angina pectoris: Secondary | ICD-10-CM | POA: Diagnosis not present

## 2016-06-28 DIAGNOSIS — M6281 Muscle weakness (generalized): Secondary | ICD-10-CM | POA: Diagnosis not present

## 2016-06-28 HISTORY — PX: TOTAL KNEE ARTHROPLASTY: SHX125

## 2016-06-28 LAB — GLUCOSE, CAPILLARY
GLUCOSE-CAPILLARY: 114 mg/dL — AB (ref 65–99)
GLUCOSE-CAPILLARY: 149 mg/dL — AB (ref 65–99)
Glucose-Capillary: 121 mg/dL — ABNORMAL HIGH (ref 65–99)
Glucose-Capillary: 111 mg/dL — ABNORMAL HIGH (ref 65–99)

## 2016-06-28 LAB — PROTIME-INR
INR: 1.16 (ref 0.00–1.49)
PROTHROMBIN TIME: 15 s (ref 11.6–15.2)

## 2016-06-28 SURGERY — ARTHROPLASTY, KNEE, TOTAL
Anesthesia: Spinal | Laterality: Right

## 2016-06-28 MED ORDER — TRANEXAMIC ACID 1000 MG/10ML IV SOLN
2000.0000 mg | Freq: Once | INTRAVENOUS | Status: AC
  Administered 2016-06-28: 2000 mg via TOPICAL
  Filled 2016-06-28: qty 20

## 2016-06-28 MED ORDER — KCL IN DEXTROSE-NACL 20-5-0.45 MEQ/L-%-% IV SOLN
INTRAVENOUS | Status: DC
Start: 2016-06-28 — End: 2016-06-29
  Administered 2016-06-28: 13:00:00 via INTRAVENOUS

## 2016-06-28 MED ORDER — FLEET ENEMA 7-19 GM/118ML RE ENEM
1.0000 | ENEMA | Freq: Once | RECTAL | Status: DC | PRN
Start: 2016-06-28 — End: 2016-06-30

## 2016-06-28 MED ORDER — MIDAZOLAM HCL 5 MG/5ML IJ SOLN
INTRAMUSCULAR | Status: DC | PRN
  Administered 2016-06-28: 1 mg via INTRAVENOUS

## 2016-06-28 MED ORDER — TIZANIDINE HCL 2 MG PO CAPS: 2.0000 mg | ORAL_CAPSULE | Freq: Three times a day (TID) | ORAL | Status: DC

## 2016-06-28 MED ORDER — SENNOSIDES-DOCUSATE SODIUM 8.6-50 MG PO TABS: 1.0000 | ORAL_TABLET | Freq: Every evening | ORAL | Status: DC | PRN

## 2016-06-28 MED ORDER — CEFUROXIME SODIUM 750 MG IJ SOLR
INTRAMUSCULAR | Status: DC | PRN
  Administered 2016-06-28: 1.5 g

## 2016-06-28 MED ORDER — HYDROMORPHONE HCL 1 MG/ML IJ SOLN
INTRAMUSCULAR | Status: AC
  Filled 2016-06-28: qty 1

## 2016-06-28 MED ORDER — NITROGLYCERIN 0.4 MG SL SUBL: 0.4000 mg | SUBLINGUAL_TABLET | SUBLINGUAL | Status: DC | PRN

## 2016-06-28 MED ORDER — OXYCODONE HCL 5 MG PO TABS
5.0000 mg | ORAL_TABLET | ORAL | Status: DC | PRN
  Administered 2016-06-28 (×2): 5 mg via ORAL
  Administered 2016-06-29 – 2016-06-30 (×7): 10 mg via ORAL
  Filled 2016-06-28 (×2): qty 2
  Filled 2016-06-28: qty 1
  Filled 2016-06-28 (×5): qty 2

## 2016-06-28 MED ORDER — TIOTROPIUM BROMIDE MONOHYDRATE 18 MCG IN CAPS
18.0000 ug | ORAL_CAPSULE | Freq: Every day | RESPIRATORY_TRACT | Status: DC
  Administered 2016-06-29 – 2016-06-30 (×2): 18 ug via RESPIRATORY_TRACT
  Filled 2016-06-28: qty 5

## 2016-06-28 MED ORDER — HYDROMORPHONE HCL 1 MG/ML IJ SOLN: 0.5000 mg | INTRAMUSCULAR | Status: DC | PRN

## 2016-06-28 MED ORDER — APIXABAN 2.5 MG PO TABS: 2.5000 mg | ORAL_TABLET | Freq: Two times a day (BID) | ORAL | Status: DC

## 2016-06-28 MED ORDER — ONDANSETRON HCL 4 MG PO TABS: 4.0000 mg | ORAL_TABLET | Freq: Four times a day (QID) | ORAL | Status: DC | PRN

## 2016-06-28 MED ORDER — DOCUSATE SODIUM 100 MG PO CAPS
100.0000 mg | ORAL_CAPSULE | Freq: Two times a day (BID) | ORAL | Status: DC
  Administered 2016-06-28 – 2016-06-30 (×4): 100 mg via ORAL
  Filled 2016-06-28 (×4): qty 1

## 2016-06-28 MED ORDER — BUPIVACAINE-EPINEPHRINE (PF) 0.25% -1:200000 IJ SOLN
INTRAMUSCULAR | Status: AC
  Filled 2016-06-28: qty 30

## 2016-06-28 MED ORDER — ACETAMINOPHEN 325 MG PO TABS
650.0000 mg | ORAL_TABLET | Freq: Four times a day (QID) | ORAL | Status: DC | PRN
Start: 2016-06-28 — End: 2016-06-30

## 2016-06-28 MED ORDER — ONDANSETRON HCL 4 MG/2ML IJ SOLN
INTRAMUSCULAR | Status: DC | PRN
  Administered 2016-06-28: 4 mg via INTRAVENOUS

## 2016-06-28 MED ORDER — METOCLOPRAMIDE HCL 5 MG PO TABS
5.0000 mg | ORAL_TABLET | Freq: Three times a day (TID) | ORAL | Status: DC | PRN
  Administered 2016-06-28: 5 mg via ORAL
  Filled 2016-06-28: qty 1

## 2016-06-28 MED ORDER — DEXTROSE-NACL 5-0.45 % IV SOLN: INTRAVENOUS | Status: DC

## 2016-06-28 MED ORDER — ROPIVACAINE HCL 5 MG/ML IJ SOLN
INTRAMUSCULAR | Status: DC | PRN
  Administered 2016-06-28: 25 mL

## 2016-06-28 MED ORDER — ONDANSETRON HCL 4 MG/2ML IJ SOLN
INTRAMUSCULAR | Status: AC
  Filled 2016-06-28: qty 2

## 2016-06-28 MED ORDER — POTASSIUM CHLORIDE CRYS ER 20 MEQ PO TBCR
40.0000 meq | EXTENDED_RELEASE_TABLET | ORAL | Status: DC
  Administered 2016-06-28 – 2016-06-29 (×2): 40 meq via ORAL
  Filled 2016-06-28 (×2): qty 2

## 2016-06-28 MED ORDER — PROPOFOL 10 MG/ML IV BOLUS
INTRAVENOUS | Status: DC | PRN
  Administered 2016-06-28: 100 mg via INTRAVENOUS

## 2016-06-28 MED ORDER — INSULIN NPH (HUMAN) (ISOPHANE) 100 UNIT/ML ~~LOC~~ SUSP
40.0000 [IU] | Freq: Every day | SUBCUTANEOUS | Status: DC
  Administered 2016-06-29 – 2016-06-30 (×2): 40 [IU] via SUBCUTANEOUS
  Filled 2016-06-28: qty 10

## 2016-06-28 MED ORDER — MIDAZOLAM HCL 2 MG/2ML IJ SOLN
INTRAMUSCULAR | Status: AC
  Filled 2016-06-28: qty 2

## 2016-06-28 MED ORDER — KCL IN DEXTROSE-NACL 20-5-0.45 MEQ/L-%-% IV SOLN
INTRAVENOUS | Status: AC
Start: 2016-06-28 — End: 2016-06-29
  Filled 2016-06-28: qty 1000

## 2016-06-28 MED ORDER — OXYCODONE HCL 5 MG PO TABS
ORAL_TABLET | ORAL | Status: AC
  Filled 2016-06-28: qty 1

## 2016-06-28 MED ORDER — BUPIVACAINE-EPINEPHRINE (PF) 0.25% -1:200000 IJ SOLN
INTRAMUSCULAR | Status: DC | PRN
  Administered 2016-06-28: 50 mL via PERINEURAL

## 2016-06-28 MED ORDER — HYDROMORPHONE HCL 1 MG/ML IJ SOLN: 0.2500 mg | INTRAMUSCULAR | Status: DC | PRN

## 2016-06-28 MED ORDER — MENTHOL 3 MG MT LOZG: 1.0000 | LOZENGE | OROMUCOSAL | Status: DC | PRN

## 2016-06-28 MED ORDER — CHLORHEXIDINE GLUCONATE 4 % EX LIQD: 60.0000 mL | Freq: Once | CUTANEOUS | Status: DC

## 2016-06-28 MED ORDER — METHIMAZOLE 5 MG PO TABS
5.0000 mg | ORAL_TABLET | ORAL | Status: DC
  Filled 2016-06-28: qty 1

## 2016-06-28 MED ORDER — ALUM & MAG HYDROXIDE-SIMETH 200-200-20 MG/5ML PO SUSP
30.0000 mL | ORAL | Status: DC | PRN
Start: 2016-06-28 — End: 2016-06-30

## 2016-06-28 MED ORDER — FUROSEMIDE 80 MG PO TABS
80.0000 mg | ORAL_TABLET | Freq: Every day | ORAL | Status: DC
  Administered 2016-06-29 – 2016-06-30 (×2): 80 mg via ORAL
  Filled 2016-06-28 (×2): qty 1
  Filled 2016-06-28: qty 2
  Filled 2016-06-28: qty 1
  Filled 2016-06-28: qty 2

## 2016-06-28 MED ORDER — SODIUM CHLORIDE 0.9 % IJ SOLN
INTRAMUSCULAR | Status: DC | PRN
  Administered 2016-06-28: 50 mL via INTRAVENOUS

## 2016-06-28 MED ORDER — CEFUROXIME SODIUM 1.5 G IJ SOLR
INTRAMUSCULAR | Status: AC
  Filled 2016-06-28: qty 1.5

## 2016-06-28 MED ORDER — HYDROMORPHONE HCL 1 MG/ML IJ SOLN
0.2500 mg | INTRAMUSCULAR | Status: DC | PRN
Start: 2016-06-28 — End: 2016-06-28
  Administered 2016-06-28 (×3): 0.5 mg via INTRAVENOUS

## 2016-06-28 MED ORDER — PROPOFOL 10 MG/ML IV BOLUS
INTRAVENOUS | Status: AC
  Filled 2016-06-28: qty 40

## 2016-06-28 MED ORDER — SODIUM CHLORIDE 0.9 % IR SOLN
Status: DC | PRN
  Administered 2016-06-28: 1000 mL

## 2016-06-28 MED ORDER — ONDANSETRON HCL 4 MG/2ML IJ SOLN
4.0000 mg | Freq: Four times a day (QID) | INTRAMUSCULAR | Status: DC | PRN
  Administered 2016-06-28: 4 mg via INTRAVENOUS
  Filled 2016-06-28: qty 2

## 2016-06-28 MED ORDER — POTASSIUM CHLORIDE CRYS ER 20 MEQ PO TBCR: 20.0000 meq | EXTENDED_RELEASE_TABLET | ORAL | Status: DC

## 2016-06-28 MED ORDER — METHOCARBAMOL 500 MG PO TABS
ORAL_TABLET | ORAL | Status: AC
  Filled 2016-06-28: qty 1

## 2016-06-28 MED ORDER — CARVEDILOL 12.5 MG PO TABS
12.5000 mg | ORAL_TABLET | Freq: Two times a day (BID) | ORAL | Status: DC
  Administered 2016-06-28 – 2016-06-30 (×4): 12.5 mg via ORAL
  Filled 2016-06-28 (×5): qty 1

## 2016-06-28 MED ORDER — BISACODYL 5 MG PO TBEC: 5.0000 mg | DELAYED_RELEASE_TABLET | Freq: Every day | ORAL | Status: DC | PRN

## 2016-06-28 MED ORDER — APIXABAN 2.5 MG PO TABS
2.5000 mg | ORAL_TABLET | Freq: Two times a day (BID) | ORAL | Status: DC
  Administered 2016-06-29 – 2016-06-30 (×3): 2.5 mg via ORAL
  Filled 2016-06-28 (×4): qty 1

## 2016-06-28 MED ORDER — LIDOCAINE 2% (20 MG/ML) 5 ML SYRINGE
INTRAMUSCULAR | Status: AC
  Filled 2016-06-28: qty 5

## 2016-06-28 MED ORDER — LACTATED RINGERS IV SOLN
INTRAVENOUS | Status: DC
  Administered 2016-06-28 (×2): via INTRAVENOUS

## 2016-06-28 MED ORDER — FENTANYL CITRATE (PF) 100 MCG/2ML IJ SOLN
INTRAMUSCULAR | Status: DC | PRN
  Administered 2016-06-28 (×3): 50 ug via INTRAVENOUS

## 2016-06-28 MED ORDER — BUPIVACAINE LIPOSOME 1.3 % IJ SUSP
20.0000 mL | Freq: Once | INTRAMUSCULAR | Status: AC
  Administered 2016-06-28: 20 mL
  Filled 2016-06-28: qty 20

## 2016-06-28 MED ORDER — ARTIFICIAL TEARS OP OINT
TOPICAL_OINTMENT | OPHTHALMIC | Status: DC | PRN
  Administered 2016-06-28: 1 via OPHTHALMIC

## 2016-06-28 MED ORDER — METHOCARBAMOL 500 MG PO TABS
500.0000 mg | ORAL_TABLET | Freq: Four times a day (QID) | ORAL | Status: DC | PRN
Start: 2016-06-28 — End: 2016-06-30
  Administered 2016-06-28 – 2016-06-30 (×3): 500 mg via ORAL
  Filled 2016-06-28 (×2): qty 1

## 2016-06-28 MED ORDER — METOCLOPRAMIDE HCL 5 MG/ML IJ SOLN: 5.0000 mg | Freq: Three times a day (TID) | INTRAMUSCULAR | Status: DC | PRN

## 2016-06-28 MED ORDER — ACETAMINOPHEN 650 MG RE SUPP: 650.0000 mg | Freq: Four times a day (QID) | RECTAL | Status: DC | PRN

## 2016-06-28 MED ORDER — LIDOCAINE HCL (CARDIAC) 20 MG/ML IV SOLN
INTRAVENOUS | Status: DC | PRN
  Administered 2016-06-28: 60 mg via INTRAVENOUS

## 2016-06-28 MED ORDER — DEXTROSE 5 % IV SOLN
500.0000 mg | Freq: Four times a day (QID) | INTRAVENOUS | Status: DC | PRN
Start: 2016-06-28 — End: 2016-06-30
  Filled 2016-06-28: qty 5

## 2016-06-28 MED ORDER — PHENOL 1.4 % MT LIQD
1.0000 | OROMUCOSAL | Status: DC | PRN
Start: 2016-06-28 — End: 2016-06-30

## 2016-06-28 MED ORDER — DIPHENHYDRAMINE HCL 12.5 MG/5ML PO ELIX: 12.5000 mg | ORAL_SOLUTION | ORAL | Status: DC | PRN

## 2016-06-28 MED ORDER — OXYCODONE-ACETAMINOPHEN 5-325 MG PO TABS: 1.0000 | ORAL_TABLET | ORAL | Status: DC | PRN

## 2016-06-28 MED ORDER — ARTIFICIAL TEARS OP OINT
TOPICAL_OINTMENT | OPHTHALMIC | Status: AC
  Filled 2016-06-28: qty 3.5

## 2016-06-28 MED ORDER — INSULIN ASPART 100 UNIT/ML ~~LOC~~ SOLN
0.0000 [IU] | Freq: Three times a day (TID) | SUBCUTANEOUS | Status: DC
  Administered 2016-06-28: 2 [IU] via SUBCUTANEOUS
  Administered 2016-06-29: 8 [IU] via SUBCUTANEOUS
  Administered 2016-06-29 (×2): 3 [IU] via SUBCUTANEOUS
  Administered 2016-06-30: 5 [IU] via SUBCUTANEOUS
  Administered 2016-06-30: 3 [IU] via SUBCUTANEOUS

## 2016-06-28 MED ORDER — FENTANYL CITRATE (PF) 250 MCG/5ML IJ SOLN
INTRAMUSCULAR | Status: AC
  Filled 2016-06-28: qty 5

## 2016-06-28 SURGICAL SUPPLY — 51 items
BAG DECANTER FOR FLEXI CONT (MISCELLANEOUS) ×3 IMPLANT
BANDAGE ESMARK 6X9 LF (GAUZE/BANDAGES/DRESSINGS) ×1 IMPLANT
BLADE SAG 18X100X1.27 (BLADE) ×3 IMPLANT
BLADE SAW SGTL 13X75X1.27 (BLADE) ×3 IMPLANT
BLADE SURG ROTATE 9660 (MISCELLANEOUS) IMPLANT
BNDG ELASTIC 6X10 VLCR STRL LF (GAUZE/BANDAGES/DRESSINGS) ×3 IMPLANT
BNDG ESMARK 6X9 LF (GAUZE/BANDAGES/DRESSINGS) ×3
BOWL SMART MIX CTS (DISPOSABLE) ×3 IMPLANT
CAPT KNEE TOTAL 3 ATTUNE ×3 IMPLANT
CEMENT HV SMART SET (Cement) ×6 IMPLANT
COVER SURGICAL LIGHT HANDLE (MISCELLANEOUS) ×3 IMPLANT
CUFF TOURNIQUET SINGLE 34IN LL (TOURNIQUET CUFF) ×3 IMPLANT
DRAPE EXTREMITY T 121X128X90 (DRAPE) ×3 IMPLANT
DRAPE U-SHAPE 47X51 STRL (DRAPES) ×3 IMPLANT
DRSG AQUACEL AG ADV 3.5X10 (GAUZE/BANDAGES/DRESSINGS) ×3 IMPLANT
DURAPREP 26ML APPLICATOR (WOUND CARE) ×6 IMPLANT
ELECT REM PT RETURN 9FT ADLT (ELECTROSURGICAL) ×3
ELECTRODE REM PT RTRN 9FT ADLT (ELECTROSURGICAL) ×1 IMPLANT
GLOVE BIO SURGEON STRL SZ7.5 (GLOVE) ×3 IMPLANT
GLOVE BIO SURGEON STRL SZ8.5 (GLOVE) ×3 IMPLANT
GLOVE BIOGEL PI IND STRL 8 (GLOVE) ×1 IMPLANT
GLOVE BIOGEL PI IND STRL 9 (GLOVE) ×1 IMPLANT
GLOVE BIOGEL PI INDICATOR 8 (GLOVE) ×2
GLOVE BIOGEL PI INDICATOR 9 (GLOVE) ×2
GOWN STRL REUS W/ TWL LRG LVL3 (GOWN DISPOSABLE) ×1 IMPLANT
GOWN STRL REUS W/ TWL XL LVL3 (GOWN DISPOSABLE) ×2 IMPLANT
GOWN STRL REUS W/TWL LRG LVL3 (GOWN DISPOSABLE) ×3
GOWN STRL REUS W/TWL XL LVL3 (GOWN DISPOSABLE) ×4
HANDPIECE INTERPULSE COAX TIP (DISPOSABLE) ×2
HOOD PEEL AWAY FACE SHEILD DIS (HOOD) ×6 IMPLANT
KIT BASIN OR (CUSTOM PROCEDURE TRAY) ×3 IMPLANT
KIT ROOM TURNOVER OR (KITS) ×3 IMPLANT
MANIFOLD NEPTUNE II (INSTRUMENTS) ×3 IMPLANT
NEEDLE 22X1 1/2 (OR ONLY) (NEEDLE) ×6 IMPLANT
NEEDLE SPNL 18GX3.5 QUINCKE PK (NEEDLE) IMPLANT
NS IRRIG 1000ML POUR BTL (IV SOLUTION) ×3 IMPLANT
PACK TOTAL JOINT (CUSTOM PROCEDURE TRAY) ×3 IMPLANT
PAD ARMBOARD 7.5X6 YLW CONV (MISCELLANEOUS) ×6 IMPLANT
SET HNDPC FAN SPRY TIP SCT (DISPOSABLE) ×1 IMPLANT
SUT VIC AB 0 CT1 27 (SUTURE) ×2
SUT VIC AB 0 CT1 27XBRD ANBCTR (SUTURE) ×1 IMPLANT
SUT VIC AB 1 CTX 36 (SUTURE) ×2
SUT VIC AB 1 CTX36XBRD ANBCTR (SUTURE) ×1 IMPLANT
SUT VIC AB 2-0 CT1 27 (SUTURE)
SUT VIC AB 2-0 CT1 TAPERPNT 27 (SUTURE) IMPLANT
SUT VIC AB 3-0 CT1 27 (SUTURE) ×3
SUT VIC AB 3-0 CT1 TAPERPNT 27 (SUTURE) ×1 IMPLANT
SYR CONTROL 10ML LL (SYRINGE) ×6 IMPLANT
TOWEL OR 17X24 6PK STRL BLUE (TOWEL DISPOSABLE) ×3 IMPLANT
TOWEL OR 17X26 10 PK STRL BLUE (TOWEL DISPOSABLE) ×3 IMPLANT
WATER STERILE IRR 1000ML POUR (IV SOLUTION) ×9 IMPLANT

## 2016-06-28 NOTE — Interval H&P Note (Signed)
History and Physical Interval Note:  06/28/2016 7:27 AM  [name]  has presented today for surgery, with the diagnosis of RIGHT KNEE OSTEOARTHRITIS  The various methods of treatment have been discussed with the patient and family. After consideration of risks, benefits and other options for treatment, the patient has consented to  Procedure(s): TOTAL KNEE ARTHROPLASTY (Right) as a surgical intervention .  The patient's history has been reviewed, patient examined, no change in status, stable for surgery.  I have reviewed the patient's chart and labs.  Questions were answered to the patient's satisfaction.     Kerin Salen

## 2016-06-28 NOTE — Anesthesia Preprocedure Evaluation (Addendum)
Anesthesia Evaluation  Patient identified by MRN, date of birth, ID band Patient awake    Reviewed: Allergy & Precautions, H&P , NPO status , Patient's Chart, lab work & pertinent test results  History of Anesthesia Complications Negative for: history of anesthetic complications  Airway Mallampati: II  TM Distance: >3 FB Neck ROM: full    Dental no notable dental hx. (+) Teeth Intact, Dental Advisory Given, Partial Upper, Caps   Pulmonary neg pulmonary ROS, sleep apnea and Continuous Positive Airway Pressure Ventilation , COPD, former smoker,    Pulmonary exam normal breath sounds clear to auscultation       Cardiovascular hypertension, Pt. on home beta blockers and Pt. on medications + CAD, + Past MI, + Cardiac Stents and +CHF  Normal cardiovascular exam+ dysrhythmias Atrial Fibrillation  Rhythm:regular Rate:Normal  BM Stent 2008.  At. Fl. Eliquis; LD 6/26   Neuro/Psych PSYCHIATRIC DISORDERS negative neurological ROS     GI/Hepatic negative GI ROS, Neg liver ROS,   Endo/Other  negative endocrine ROSdiabetes, Poorly Controlled, Type 2, Insulin DependentHyperthyroidism   Renal/GU negative Renal ROS     Musculoskeletal  (+) Arthritis ,   Abdominal   Peds  Hematology negative hematology ROS (+)   Anesthesia Other Findings Crowns upper left back; partial upper right  Reproductive/Obstetrics negative OB ROS                           Anesthesia Physical Anesthesia Plan  ASA: III  Anesthesia Plan: Spinal   Post-op Pain Management:    Induction: Intravenous  Airway Management Planned: Simple Face Mask  Additional Equipment:   Intra-op Plan:   Post-operative Plan:   Informed Consent: I have reviewed the patients History and Physical, chart, labs and discussed the procedure including the risks, benefits and alternatives for the proposed anesthesia with the patient or authorized  representative who has indicated his/her understanding and acceptance.   Dental Advisory Given  Plan Discussed with: Anesthesiologist, CRNA and Surgeon  Anesthesia Plan Comments: (Spinal pending eliquos, needs to be off for 3 days)       Anesthesia Quick Evaluation

## 2016-06-28 NOTE — Progress Notes (Signed)
RT spoke with patient about wearing CPAP and the patient stated that she was going to forgo wearing the CPAP tonight and have family bring hers tomorrow.  RT advised pt that if she changed her mind that she could have respiratory notified.  RT will continue to monitor.  RN present for conversation.

## 2016-06-28 NOTE — Anesthesia Postprocedure Evaluation (Signed)
Anesthesia Post Note  Patient: [name]  Procedure(s) Performed: Procedure(s) (LRB): TOTAL KNEE ARTHROPLASTY (Right)  Patient location during evaluation: PACU Anesthesia Type: General and Regional Level of consciousness: awake and alert Pain management: pain level controlled Vital Signs Assessment: post-procedure vital signs reviewed and stable Respiratory status: spontaneous breathing, nonlabored ventilation, respiratory function stable and patient connected to nasal cannula oxygen Cardiovascular status: blood pressure returned to baseline and stable Postop Assessment: no signs of nausea or vomiting Anesthetic complications: no    Last Vitals:  Filed Vitals:   06/28/16 1300 06/28/16 1327  BP: 147/64 142/65  Pulse: 55 58  Temp: 36.4 C 36.4 C  Resp: 13 14    Last Pain:  Filed Vitals:   06/28/16 1341  PainSc: Asleep                 Brandan Robicheaux,W. EDMOND

## 2016-06-28 NOTE — Op Note (Signed)
PATIENT ID:      [name]  MRN:     [id number] DOB/AGE:    80-Dec-1932 / 80 y.o.       OPERATIVE REPORT    DATE OF PROCEDURE:  06/28/2016       PREOPERATIVE DIAGNOSIS:   RIGHT KNEE OSTEOARTHRITIS      Estimated body mass index is 31.05 kg/(m^2) as calculated from the following:   Height as of this encounter: 5\' 4"  (1.626 m).   Weight as of this encounter: 82.101 kg (181 lb).                                                        POSTOPERATIVE DIAGNOSIS:   same                                                                      PROCEDURE:  Procedure(s): TOTAL KNEE ARTHROPLASTY Using DepuyAttune RP implants #5R Femur, #6Tibia, 5 mm Attune RP bearing, 38 Patella     SURGEON: Neziah Braley J    ASSISTANT:   [employer] K[employer]   (Present and scrubbed throughout the case, critical for assistance with exposure, retraction, instrumentation, and closure.)         ANESTHESIA: GET, 20cc Exparel, 50cc 0.25% Marcaine  EBL: 300  FLUID REPLACEMENT: 1600 crystalloid  TOURNIQUET TIME: 5min  Drains: None  Tranexamic Acid: 2gm topical   COMPLICATIONS:  None         INDICATIONS FOR PROCEDURE: The patient has  RIGHT KNEE OSTEOARTHRITIS, Var deformities, XR shows bone on bone arthritis, lateral subluxation of tibia. Patient has failed all conservative measures including anti-inflammatory medicines, narcotics, attempts at  exercise and weight loss, cortisone injections and viscosupplementation.  Risks and benefits of surgery have been discussed, questions answered.   DESCRIPTION OF PROCEDURE: The patient identified by armband, received  IV antibiotics, in the holding area at [facility]. Patient taken to the operating room, appropriate anesthetic  monitors were attached, and GET anesthesia was  induced. Tourniquet  applied high to the operative thigh. Lateral post and foot positioner  applied to the table, the lower extremity was then prepped and draped  in usual sterile fashion from  the toes to the tourniquet. Time-out procedure was performed. We began the operation, with the knee flexed 120 degrees, by making the anterior midline incision starting at handbreadth above the patella going over the patella 1 cm medial to and 4 cm distal to the tibial tubercle. Small bleeders in the skin and the  subcutaneous tissue identified and cauterized. Transverse retinaculum was incised and reflected medially and a medial parapatellar arthrotomy was accomplished. the patella was everted and theprepatellar fat pad resected. The superficial medial collateral  ligament was then elevated from anterior to posterior along the proximal  flare of the tibia and anterior half of the menisci resected. The knee was hyperflexed exposing bone on bone arthritis. Peripheral and notch osteophytes as well as the cruciate ligaments were then resected. We continued to  work our way around posteriorly along the proximal tibia, and externally  rotated the tibia subluxing it out from underneath the femur. A McHale  retractor was placed through the notch and a lateral Hohmann retractor  placed, and we then drilled through the proximal tibia in line with the  axis of the tibia followed by an intramedullary guide rod and 2-degree  posterior slope cutting guide. The tibial cutting guide, 3 degree posterior sloped, was pinned into place allowing resection of 2 mm of bone medially and 10 mm of bone laterally. Satisfied with the tibial resection, we then  entered the distal femur 2 mm anterior to the PCL origin with the  intramedullary guide rod and applied the distal femoral cutting guide  set at 9 mm, with 5 degrees of valgus. This was pinned along the  epicondylar axis. At this point, the distal femoral cut was accomplished without difficulty. We then sized for a #5R femoral component and pinned the guide in 3 degrees of external rotation. The chamfer cutting guide was pinned into place. The anterior, posterior, and  chamfer cuts were accomplished without difficulty followed by  the Attune RP box cutting guide and the box cut. We also removed posterior osteophytes from the posterior femoral condyles. At this  time, the knee was brought into full extension. We checked our  extension and flexion gaps and found them symmetric for a 5 mm bearing. Distracting in extension with a lamina spreader, the posterior horns of the menisci were removed, and Exparel, diluted to 60 cc, with 20cc NS, and 20cc 0.5% Marcaine,was injected into the capsule and synovium of the knee. The posterior patella cut was accomplished with the 9.5 mm Attune cutting guide, sized for a 42mm dome, and the fixation pegs drilled.The knee  was then once again hyperflexed exposing the proximal tibia. We sized for a # 6 tibial base plate, applied the smokestack and the conical reamer followed by the the Delta fin keel punch. We then hammered into place the Attune RP trial femoral component, drilled the lugs, inserted a  5 mm trial bearing, trial patellar button, and took the knee through range of motion from 0-130 degrees. No thumb pressure was required for patellar Tracking. At this point, the limb was wrapped with an Esmarch bandage and the tourniquet inflated to 350 mmHg. All trial components were removed, mating surfaces irrigated with pulse lavage, and dried with suction and sponges. A double batch of DePuy HV cement with 1500 mg of Zinacef was mixed and applied to all bony metallic mating surfaces except for the posterior condyles of the femur itself. In order, we  hammered into place the tibial tray and removed excess cement, the femoral component and removed excess cement. The final Attune RP bearing  was inserted, and the knee brought to full extension with compression.  The patellar button was clamped into place, and excess cement  removed. While the cement cured the wound was irrigated out with normal saline solution pulse lavage. Ligament stability  and patellar tracking were checked and found to be excellent. The parapatellar arthrotomy was closed with  running #1 Vicryl suture. The subcutaneous tissue with 0 and 2-0 undyed  Vicryl suture, and the skin with running 3-0 SQ vicryl. A dressing of Xeroform,  4 x 4, dressing sponges, Webril, and Ace wrap applied. The patient  awakened, and taken to recovery room without difficulty.   Ceara Wrightson J 06/28/2016, 11:09 AM

## 2016-06-28 NOTE — Anesthesia Procedure Notes (Addendum)
Procedure Name: LMA Insertion Date/Time: 06/28/2016 9:49 AM Performed by: Terrill Mohr Pre-anesthesia Checklist: Emergency Drugs available, Patient identified, Suction available and Patient being monitored Patient Re-evaluated:Patient Re-evaluated prior to inductionOxygen Delivery Method: Circle system utilized Preoxygenation: Pre-oxygenation with 100% oxygen Intubation Type: IV induction Ventilation: Mask ventilation without difficulty LMA: LMA inserted LMA Size: 4.0 Number of attempts: 1 Placement Confirmation: positive ETCO2 and breath sounds checked- equal and bilateral Tube secured with: Tape (taped across cheeks) Dental Injury: Teeth and Oropharynx as per pre-operative assessment     Anesthesia Regional Block:  Adductor canal block  Pre-Anesthetic Checklist: ,, timeout performed, Correct Patient, Correct Site, Correct Laterality, Correct Procedure, Correct Position, site marked, Risks and benefits discussed, pre-op evaluation,  At surgeon's request and post-op pain management  Laterality: Right  Prep: Maximum Sterile Barrier Precautions used and chloraprep       Needles:  Injection technique: Single-shot  Needle Type: Echogenic Stimulator Needle     Needle Length: 9cm 9 cm Needle Gauge: 21 and 21 G    Additional Needles:  Procedures: ultrasound guided (picture in chart) Adductor canal block Narrative:  Start time: 06/28/2016 11:26 AM End time: 06/28/2016 11:36 AM Injection made incrementally with aspirations every 5 mL. Anesthesiologist: Roderic Palau  Additional Notes: 2% Lidocaine skin wheel.

## 2016-06-28 NOTE — Discharge Instructions (Addendum)
INSTRUCTIONS AFTER JOINT REPLACEMENT  ° °o Remove items at home which could result in a fall. This includes throw rugs or furniture in walking pathways °o ICE to the affected joint every three hours while awake for 30 minutes at a time, for at least the first 3-5 days, and then as needed for pain and swelling.  Continue to use ice for pain and swelling. You may notice swelling that will progress down to the foot and ankle.  This is normal after surgery.  Elevate your leg when you are not up walking on it.   °o Continue to use the breathing machine you got in the hospital (incentive spirometer) which will help keep your temperature down.  It is common for your temperature to cycle up and down following surgery, especially at night when you are not up moving around and exerting yourself.  The breathing machine keeps your lungs expanded and your temperature down. ° ° °DIET:  As you were doing prior to hospitalization, we recommend a well-balanced diet. ° °DRESSING / WOUND CARE / SHOWERING ° °Keep the surgical dressing until follow up.  The dressing is water proof, so you can shower without any extra covering.  IF THE DRESSING FALLS OFF or the wound gets wet inside, change the dressing with sterile gauze.  Please use good hand washing techniques before changing the dressing.  Do not use any lotions or creams on the incision until instructed by your surgeon.   ° °ACTIVITY ° °o Increase activity slowly as tolerated, but follow the weight bearing instructions below.   °o No driving for 6 weeks or until further direction given by your physician.  You cannot drive while taking narcotics.  °o No lifting or carrying greater than 10 lbs. until further directed by your surgeon. °o Avoid periods of inactivity such as sitting longer than an hour when not asleep. This helps prevent blood clots.  °o You may return to work once you are authorized by your doctor.  ° ° ° °WEIGHT BEARING  ° °Weight bearing as tolerated with assist  device (walker, cane, etc) as directed, use it as long as suggested by your surgeon or therapist, typically at least 4-6 weeks. ° ° °EXERCISES ° °Results after joint replacement surgery are often greatly improved when you follow the exercise, range of motion and muscle strengthening exercises prescribed by your doctor. Safety measures are also important to protect the joint from further injury. Any time any of these exercises cause you to have increased pain or swelling, decrease what you are doing until you are comfortable again and then slowly increase them. If you have problems or questions, call your caregiver or physical therapist for advice.  ° °Rehabilitation is important following a joint replacement. After just a few days of immobilization, the muscles of the leg can become weakened and shrink (atrophy).  These exercises are designed to build up the tone and strength of the thigh and leg muscles and to improve motion. Often times heat used for twenty to thirty minutes before working out will loosen up your tissues and help with improving the range of motion but do not use heat for the first two weeks following surgery (sometimes heat can increase post-operative swelling).  ° °These exercises can be done on a training (exercise) mat, on the floor, on a table or on a bed. Use whatever works the best and is most comfortable for you.    Use music or television while you are exercising so that   the exercises are a pleasant break in your day. This will make your life better with the exercises acting as a break in your routine that you can look forward to.   Perform all exercises about fifteen times, three times per day or as directed.  You should exercise both the operative leg and the other leg as well. ° °Exercises include: °  °• Quad Sets - Tighten up the muscle on the front of the thigh (Quad) and hold for 5-10 seconds.   °• Straight Leg Raises - With your knee straight (if you were given a brace, keep it on),  lift the leg to 60 degrees, hold for 3 seconds, and slowly lower the leg.  Perform this exercise against resistance later as your leg gets stronger.  °• Leg Slides: Lying on your back, slowly slide your foot toward your buttocks, bending your knee up off the floor (only go as far as is comfortable). Then slowly slide your foot back down until your leg is flat on the floor again.  °• Angel Wings: Lying on your back spread your legs to the side as far apart as you can without causing discomfort.  °• Hamstring Strength:  Lying on your back, push your heel against the floor with your leg straight by tightening up the muscles of your buttocks.  Repeat, but this time bend your knee to a comfortable angle, and push your heel against the floor.  You may put a pillow under the heel to make it more comfortable if necessary.  ° °A rehabilitation program following joint replacement surgery can speed recovery and prevent re-injury in the future due to weakened muscles. Contact your doctor or a physical therapist for more information on knee rehabilitation.  ° ° °CONSTIPATION ° °Constipation is defined medically as fewer than three stools per week and severe constipation as less than one stool per week.  Even if you have a regular bowel pattern at home, your normal regimen is likely to be disrupted due to multiple reasons following surgery.  Combination of anesthesia, postoperative narcotics, change in appetite and fluid intake all can affect your bowels.  ° °YOU MUST use at least one of the following options; they are listed in order of increasing strength to get the job done.  They are all available over the counter, and you may need to use some, POSSIBLY even all of these options:   ° °Drink plenty of fluids (prune juice may be helpful) and high fiber foods °Colace 100 mg by mouth twice a day  °Senokot for constipation as directed and as needed Dulcolax (bisacodyl), take with full glass of water  °Miralax (polyethylene glycol)  once or twice a day as needed. ° °If you have tried all these things and are unable to have a bowel movement in the first 3-4 days after surgery call either your surgeon or your primary doctor.   ° °If you experience loose stools or diarrhea, hold the medications until you stool forms back up.  If your symptoms do not get better within 1 week or if they get worse, check with your doctor.  If you experience "the worst abdominal pain ever" or develop nausea or vomiting, please contact the office immediately for further recommendations for treatment. ° ° °ITCHING:  If you experience itching with your medications, try taking only a single pain pill, or even half a pain pill at a time.  You can also use Benadryl over the counter for itching or also to   help with sleep.   TED HOSE STOCKINGS:  Use stockings on both legs until for at least 2 weeks or as directed by physician office. They may be removed at night for sleeping.  MEDICATIONS:  See your medication summary on the After Visit Summary that nursing will review with you.  You may have some home medications which will be placed on hold until you complete the course of blood thinner medication.  It is important for you to complete the blood thinner medication as prescribed.  PRECAUTIONS:  If you experience chest pain or shortness of breath - call 911 immediately for transfer to the hospital emergency department.   If you develop a fever greater that 101 F, purulent drainage from wound, increased redness or drainage from wound, foul odor from the wound/dressing, or calf pain - CONTACT YOUR SURGEON.                                                   FOLLOW-UP APPOINTMENTS:  If you do not already have a post-op appointment, please call the office for an appointment to be seen by your surgeon.  Guidelines for how soon to be seen are listed in your After Visit Summary, but are typically between 1-4 weeks after surgery.  OTHER INSTRUCTIONS:   Knee  Replacement:  Do not place pillow under knee, focus on keeping the knee straight while resting. CPM instructions: 0-90 degrees, 2 hours in the morning, 2 hours in the afternoon, and 2 hours in the evening. Place foam block, curve side up under heel at all times except when in CPM or when walking.  DO NOT modify, tear, cut, or change the foam block in any way.  MAKE SURE YOU:   Understand these instructions.   Get help right away if you are not doing well or get worse.    Thank you for letting us be a part of your medical care team.  It is a privilege we respect greatly.  We hope these instructions will help you stay on track for a fast and full recovery!    Information on my medicine - ELIQUIS (apixaban)  This medication education was reviewed with me or my healthcare representative as part of my discharge preparation.  The pharmacist that spoke with me during my hospital stay was:  Romona Curls, [facility]  Why was Eliquis prescribed for you? Eliquis was prescribed for you to reduce the risk of blood clots forming after orthopedic surgery.    What do You need to know about Eliquis? Take your Eliquis TWICE DAILY - one tablet in the morning and one tablet in the evening with or without food.  It would be best to take the dose about the same time each day.  If you have difficulty swallowing the tablet whole please discuss with your pharmacist how to take the medication safely.  Take Eliquis exactly as prescribed by your doctor and DO NOT stop taking Eliquis without talking to the doctor who prescribed the medication.  Stopping without other medication to take the place of Eliquis may increase your risk of developing a clot.  After discharge, you should have regular check-up appointments with your healthcare provider that is prescribing your Eliquis.  What do you do if you miss a dose? If a dose of ELIQUIS is not taken at the scheduled time,  take it as soon as possible on the same day  and twice-daily administration should be resumed.  The dose should not be doubled to make up for a missed dose.  Do not take more than one tablet of ELIQUIS at the same time.  Important Safety Information A possible side effect of Eliquis is bleeding. You should call your healthcare provider right away if you experience any of the following: ? Bleeding from an injury or your nose that does not stop. ? Unusual colored urine (red or dark brown) or unusual colored stools (red or black). ? Unusual bruising for unknown reasons. ? A serious fall or if you hit your head (even if there is no bleeding).  Some medicines may interact with Eliquis and might increase your risk of bleeding or clotting while on Eliquis. To help avoid this, consult your healthcare provider or pharmacist prior to using any new prescription or non-prescription medications, including herbals, vitamins, non-steroidal anti-inflammatory drugs (NSAIDs) and supplements.  This website has more information on Eliquis (apixaban): http://[email or web address].com/eliquis/home

## 2016-06-28 NOTE — Transfer of Care (Signed)
Immediate Anesthesia Transfer of Care Note  Patient: [name]  Procedure(s) Performed: Procedure(s): TOTAL KNEE ARTHROPLASTY (Right)  Patient Location: PACU  Anesthesia Type:General  Level of Consciousness: awake, alert  and patient cooperative  Airway & Oxygen Therapy: Patient Spontanous Breathing and Patient connected to nasal cannula oxygen  Post-op Assessment: Report given to RN, Post -op Vital signs reviewed and stable and Patient moving all extremities  Post vital signs: Reviewed and stable  Last Vitals:  Filed Vitals:   06/28/16 0754  BP: 147/51  Pulse: 63  Temp: 36.8 C  Resp: 18    Last Pain:  Filed Vitals:   06/28/16 1151  PainSc: 6          Complications: No apparent anesthesia complications

## 2016-06-28 NOTE — Evaluation (Signed)
Physical Therapy Evaluation Patient Details Name: [name] MRN: [id number] DOB: [birth year] Today's Date: 06/28/2016   History of Present Illness  Patient is an 82 y/o female s/p R TKA with PMH of HTN, OSA on CPAP, COPD, DM, hyperthyroidism, diastolic CHF, a-fib.  Clinical Impression  Patient presents with decreased independence with mobility due to deficits listed in PT problem list.  She will benefit from skilled PT in the acute setting to allow return home with intermittent family assist following SNF level rehab stay.    Follow Up Recommendations SNF (reported plans to do rehab at [facility])    Equipment Recommendations  Rolling walker with 5" wheels    Recommendations for Other Services       Precautions / Restrictions Precautions Precautions: Fall Precaution Comments: watch O2 Restrictions Weight Bearing Restrictions: Yes      Mobility  Bed Mobility Overal bed mobility: Needs Assistance Bed Mobility: Supine to Sit     Supine to sit: Min assist     General bed mobility comments: assist for legs over to EOB, pt used elevated HOB and rail to lift trunk  Transfers Overall transfer level: Needs assistance Equipment used: Rolling walker (2 wheeled) Transfers: Sit to/from [facility] Sit to Stand: Min assist Stand pivot transfers: Min assist       General transfer comment: assist up from EOB with cues for technique; assist wtih RW to stand step over to chair, limited due to N&V and pt reported feeling light headed; RN in the room and to get nausea medication  Ambulation/Gait                Stairs            Wheelchair Mobility    Modified Rankin (Stroke Patients Only)       Balance Overall balance assessment: Needs assistance   Sitting balance-Leahy Scale: Fair     Standing balance support: Bilateral upper extremity supported Standing balance-Leahy Scale: Poor Standing balance comment: UE support needed for  balance                             Pertinent Vitals/Pain Pain Assessment: Faces Faces Pain Scale: Hurts a little bit Pain Location: R LE with weight bearing Pain Descriptors / Indicators: Grimacing Pain Intervention(s): Repositioned;Monitored during session    Home Living Family/patient expects to be discharged to:: Private residence Living Arrangements: Children (son) Available Help at Discharge: Family;Available PRN/intermittently Type of Home: Apartment Home Access: Level entry     Home Layout: One level Home Equipment: Cane - single point      Prior Function Level of Independence: Independent;Independent with assistive device(s)         Comments: used cane when she went out     Hand Dominance   Dominant Hand: Right    Extremity/Trunk Assessment   Upper Extremity Assessment: Overall WFL for tasks assessed           Lower Extremity Assessment: RLE deficits/detail RLE Deficits / Details: AAROM about 60 degrees flexion in supine, strength at least 3-/5 hip flexion, 2+/5 knee extension       Communication   Communication: No difficulties  Cognition Arousal/Alertness: Awake/alert Behavior During Therapy: WFL for tasks assessed/performed Overall Cognitive Status: Within Functional Limits for tasks assessed                      General Comments General comments (skin integrity, edema,  etc.): SpO2 after OOB to chair on RA 82%; applied O2 @ 2LPM and cues for PLB without much improvement; pt used incentive spiromenter x 8 and improved to 92%    Exercises Total Joint Exercises Ankle Circles/Pumps: AROM;Both;Supine;10 reps Heel Slides: AAROM;Both;10 reps;Supine      Assessment/Plan    PT Assessment Patient needs continued PT services  PT Diagnosis Difficulty walking;Acute pain   PT Problem List Decreased strength;Decreased range of motion;Decreased activity tolerance;Decreased balance;Decreased mobility;Pain;Decreased knowledge of use  of DME;Cardiopulmonary status limiting activity  PT Treatment Interventions DME instruction;Gait training;Functional mobility training;Patient/family education;Therapeutic activities;Therapeutic exercise;Balance training   PT Goals (Current goals can be found in the Care Plan section) Acute Rehab PT Goals Patient Stated Goal: To go to rehab prior to home PT Goal Formulation: With patient Time For Goal Achievement: 07/05/16 Potential to Achieve Goals: Good    Frequency 7X/week   Barriers to discharge        Co-evaluation               End of Session Equipment Utilized During Treatment: Gait belt;Oxygen Activity Tolerance: Patient limited by fatigue;Treatment limited secondary to medical complications (Comment) (N&V) Patient left: in chair;with call bell/phone within reach           Time: [id number] PT Time Calculation (min) (ACUTE ONLY): 32 min   Charges:   PT Evaluation $PT Eval Moderate Complexity: 1 Procedure PT Treatments $Therapeutic Activity: 8-22 mins   PT G Codes:        Reginia Naas 07/09/2016, 5:27 PM  Magda Kiel, [place] Jul 09, 2016

## 2016-06-28 NOTE — Progress Notes (Signed)
Orthopedic Tech Progress Note Patient Details:  [name] 08/17/1931 [id number] Viewed order from doctor's order list CPM Right Knee CPM Right Knee: On Right Knee Flexion (Degrees): 40 Right Knee Extension (Degrees): 0 Additional Comments: trapeze bar patient helper   Hildred Priest 06/28/2016, 12:47 PM

## 2016-06-29 LAB — BASIC METABOLIC PANEL
ANION GAP: 9 (ref 5–15)
BUN: 15 mg/dL (ref 6–20)
CHLORIDE: 106 mmol/L (ref 101–111)
CO2: 21 mmol/L — AB (ref 22–32)
Calcium: 9.2 mg/dL (ref 8.9–10.3)
Creatinine, Ser: 0.83 mg/dL (ref 0.44–1.00)
GFR calc non Af Amer: >60 mL/min (ref >60)
Glucose, Bld: 138 mg/dL — ABNORMAL HIGH (ref 65–99)
POTASSIUM: 5.1 mmol/L (ref 3.5–5.1)
SODIUM: 136 mmol/L (ref 135–145)

## 2016-06-29 LAB — GLUCOSE, CAPILLARY
GLUCOSE-CAPILLARY: 289 mg/dL — AB (ref 65–99)
GLUCOSE-CAPILLARY: 206 mg/dL — AB (ref 65–99)
GLUCOSE-CAPILLARY: 199 mg/dL — AB (ref 65–99)
GLUCOSE-CAPILLARY: 198 mg/dL — AB (ref 65–99)
GLUCOSE-CAPILLARY: 191 mg/dL — AB (ref 65–99)

## 2016-06-29 LAB — CBC
HEMATOCRIT: 41.7 % (ref 36.0–46.0)
HEMOGLOBIN: 13 g/dL (ref 12.0–15.0)
MCH: 29.7 pg (ref 26.0–34.0)
MCHC: 31.2 g/dL (ref 30.0–36.0)
MCV: 95.4 fL (ref 78.0–100.0)
Platelets: 152 10*3/uL (ref 150–400)
RBC: 4.37 MIL/uL (ref 3.87–5.11)
RDW: 15.3 % (ref 11.5–15.5)
WBC: 7.5 10*3/uL (ref 4.0–10.5)

## 2016-06-29 MED ORDER — DEXTROSE-NACL 5-0.45 % IV SOLN
INTRAVENOUS | Status: DC
  Administered 2016-06-29: 14:00:00 via INTRAVENOUS

## 2016-06-29 NOTE — Progress Notes (Signed)
Physical Therapy Treatment Patient Details Name: [name] MRN: [id number] DOB: 1931-10-07 Today's Date: 06/29/2016    History of Present Illness Patient is an 82 y/o female s/p R TKA with PMH of HTN, OSA on CPAP, COPD, DM, hyperthyroidism, diastolic CHF, a-fib.    PT Comments    Pt progressed to gait training.  Will f/u this pm.    Follow Up Recommendations  SNF     Equipment Recommendations  Rolling walker with 5" wheels    Recommendations for Other Services       Precautions / Restrictions Precautions Precautions: Fall Precaution Comments: watch O2 Restrictions Weight Bearing Restrictions: Yes Other Position/Activity Restrictions: WBAT    Mobility  Bed Mobility Overal bed mobility: Needs Assistance Bed Mobility: Supine to Sit     Supine to sit: Min assist     General bed mobility comments: Pt recieved in recliner chair on arrival.    Transfers Overall transfer level: Needs assistance Equipment used: Rolling walker (2 wheeled) Transfers: Sit to/from Stand Sit to Stand: Mod assist;Min assist (min from [facility] and mod assist from recliner chair.  ) Stand pivot transfers: Min assist       General transfer comment: Pt performed with cues for hand placement and advancement of RLE forward to improve ease and decrease pain.    Ambulation/Gait Ambulation/Gait assistance: Min assist;Mod assist Ambulation Distance (Feet): 16 Feet (x2 trials) Assistive device: Rolling walker (2 wheeled) Gait Pattern/deviations: Step-to pattern;Antalgic;Trunk flexed;Shuffle;Decreased stride length     General Gait Details: Cues for sequencing and RW placement.  Slow guarded movement.    Stairs            Wheelchair Mobility    Modified Rankin (Stroke Patients Only)       Balance   Sitting-balance support: Feet supported;No upper extremity supported Sitting balance-Leahy Scale: Good     Standing balance support: Bilateral upper extremity supported;During  functional activity Standing balance-Leahy Scale: Poor                      Cognition Arousal/Alertness: Awake/alert Behavior During Therapy: WFL for tasks assessed/performed;Anxious Overall Cognitive Status: Within Functional Limits for tasks assessed                      Exercises Total Joint Exercises Ankle Circles/Pumps: AROM;Both;Supine;10 reps Quad Sets: AROM;Right;10 reps;Supine Heel Slides: AAROM;Right;10 reps;Supine Hip ABduction/ADduction: AAROM;Right;10 reps;Supine Straight Leg Raises: AAROM;Right;10 reps;Supine    General Comments        Pertinent Vitals/Pain Pain Assessment: 0-10 Pain Score: 5  Faces Pain Scale: Hurts a little bit Pain Location: RLE/calf, knee and thigh Pain Descriptors / Indicators: Aching;Sore Pain Intervention(s): Monitored during session;Repositioned    Home Living Family/patient expects to be discharged to:: Skilled nursing facility Living Arrangements: Children Available Help at Discharge: Family;Available PRN/intermittently Type of Home: Apartment Home Access: Level entry   Home Layout: One level Home Equipment: Cane - single point      Prior Function Level of Independence: Independent;Independent with assistive device(s)      Comments: used cane when she went out   PT Goals (current goals can now be found in the care plan section) Acute Rehab PT Goals Patient Stated Goal: To go to rehab prior to home Potential to Achieve Goals: Good Progress towards PT goals: Progressing toward goals    Frequency  7X/week    PT Plan Current plan remains appropriate    Co-evaluation  End of Session Equipment Utilized During Treatment: Gait belt;Oxygen Activity Tolerance: Patient tolerated treatment well       Time: [id number] PT Time Calculation (min) (ACUTE ONLY): 31 min  Charges:  $Gait Training: 8-22 mins $Therapeutic Exercise: 8-22 mins                    G Codes:      Cristela Blue Jul 24, 2016, 2:19 PM Governor Rooks, PTA pager [phone]

## 2016-06-29 NOTE — Evaluation (Signed)
Occupational Therapy Evaluation Patient Details Name: [name] MRN: [id number] DOB: 01-Nov-1931 Today's Date: 06/29/2016    History of Present Illness Patient is an 82 y/o female s/p R TKA with PMH of HTN, OSA on CPAP, COPD, DM, hyperthyroidism, diastolic CHF, a-fib.   Clinical Impression   Pt with decline in function and safety with ADLs and ADL mobility with decreased strength, balance and endurance. Pt required increased time to complete functional mobility to sit EOB, sit - stand and for transfers to [facility] and recliner. Pt limited by pain/stiffness. Pt would benefit from acute OT services to address impairments to increase level of function and safety. Pt planing to d/c to [facility] for further therapy before going home    Follow Up Recommendations  SNF;Supervision/Assistance - 24 hour    Equipment Recommendations  Other (comment) (TBD at next level of care)    Recommendations for Other Services       Precautions / Restrictions Precautions Precautions: Fall Precaution Comments: watch O2 Restrictions Weight Bearing Restrictions: Yes Other Position/Activity Restrictions: WBAT      Mobility Bed Mobility Overal bed mobility: Needs Assistance Bed Mobility: Supine to Sit     Supine to sit: Min assist     General bed mobility comments: assist for legs over to EOB, pt used elevated HOB and rail to lift trunk  Transfers Overall transfer level: Needs assistance Equipment used: Rolling walker (2 wheeled) Transfers: Sit to/from Stand Sit to Stand: Min assist Stand pivot transfers: Min assist       General transfer comment: pt limited by pain, difficulty advancing feet. Pt required increased time to complete transfers to [facility] and recliner    Balance   Sitting-balance support: Feet supported;No upper extremity supported Sitting balance-Leahy Scale: Good     Standing balance support: Bilateral upper extremity supported;During functional activity Standing  balance-Leahy Scale: Poor                              ADL Overall ADL's : Needs assistance/impaired     Grooming: Wash/dry hands;Wash/dry face;Set up;Sitting   Upper Body Bathing: Set up;Sitting;Min guard   Lower Body Bathing: Maximal assistance   Upper Body Dressing : Set up;Sitting;Min guard   Lower Body Dressing: Total assistance   Toilet Transfer: Minimal assistance;BSC;RW Toilet Transfer Details (indicate cue type and reason): pt limited by pain, difficulty advancing feet. Pt required increased time to complete transfer to [place] Manipulation and Hygiene: Maximal assistance       Functional mobility during ADLs:  (pt limited by pain, difficulty advancing feet. Pt required increased time to complete transfers to [facility] and recliner)       Vision  wears glasses, no change from baseline              Pertinent Vitals/Pain Pain Assessment: 0-10 Pain Score: 5  Pain Location: R LE Pain Descriptors / Indicators: Aching;Sore Pain Intervention(s): Monitored during session;Premedicated before session;Repositioned;Limited activity within patient's tolerance     Hand Dominance Right   Extremity/Trunk Assessment Upper Extremity Assessment Upper Extremity Assessment: Generalized weakness   Lower Extremity Assessment Lower Extremity Assessment: Defer to PT evaluation       Communication Communication Communication: No difficulties   Cognition Arousal/Alertness: Awake/alert Behavior During Therapy: WFL for tasks assessed/performed Overall Cognitive Status: Within Functional Limits for tasks assessed  General Comments   pt pleasant and cooperative                 Home Living Family/patient expects to be discharged to:: Skilled nursing facility Living Arrangements: Children Available Help at Discharge: Family;Available PRN/intermittently Type of Home: Apartment Home Access: Level entry     Home  Layout: One level     Bathroom Shower/Tub: Teacher, early years/pre: Standard     Home Equipment: Cane - single point          Prior Functioning/Environment Level of Independence: Independent;Independent with assistive device(s)        Comments: used cane when she went out    OT Diagnosis: Generalized weakness;Acute pain   OT Problem List: Impaired balance (sitting and/or standing);Pain;Decreased activity tolerance;Decreased knowledge of use of DME or AE;Decreased strength   OT Treatment/Interventions: Self-care/ADL training;Patient/family education;Therapeutic activities;DME and/or AE instruction    OT Goals(Current goals can be found in the care plan section) Acute Rehab OT Goals Patient Stated Goal: To go to rehab prior to home OT Goal Formulation: With patient Time For Goal Achievement: 07/06/16 Potential to Achieve Goals: Good ADL Goals Pt Will Perform Grooming: with min assist;standing Pt Will Perform Lower Body Bathing: with mod assist;sitting/lateral leans;sit to/from stand Pt Will Transfer to Toilet: with min guard assist;bedside commode;ambulating;grab bars (3 in 1 over toilet) Pt Will Perform Toileting - Clothing Manipulation and hygiene: with mod assist;sit to/from stand Additional ADL Goal #1: pt will complete UB ADLs set up/sup seated unsupported  OT Frequency: Min 2X/week   Barriers to D/C: Decreased caregiver support                        End of Session Equipment Utilized During Treatment: Gait belt;Rolling walker;Other (comment) (BSC) CPM Right Knee CPM Right Knee: Off  Activity Tolerance: Patient limited by pain Patient left: in chair;with call bell/phone within reach   Time: [id number] OT Time Calculation (min): 46 min Charges:  OT General Charges $OT Visit: 1 Procedure OT Evaluation $OT Eval Moderate Complexity: 1 Procedure OT Treatments $Self Care/Home Management : 8-22 mins $Therapeutic Activity: 8-22 mins G-Codes:     Britt Bottom 06/29/2016, 11:52 AM

## 2016-06-29 NOTE — Progress Notes (Signed)
Patient ID: [name], female   DOB: 31-Mar-1931, 82 y.o.   MRN: [id number] PATIENT ID: [name]  MRN: [id number]  DOB/AGE:  [birth year] / 82 y.o.  1 Day Post-Op Procedure(s) (LRB): TOTAL KNEE ARTHROPLASTY (Right)    PROGRESS NOTE Subjective: Patient is alert, oriented, x2 Nausea, x1 Vomiting, yes passing gas. Taking PO well. Denies SOB, Chest or Calf Pain. Using Incentive Spirometer, PAS in place. Ambulate WBAT, CPM 0-40 Patient reports pain as 5/10 .    Objective: Vital signs in last 24 hours: Filed Vitals:   06/28/16 1327 06/28/16 1931 06/28/16 2327 06/29/16 0355  BP: 142/65 154/93 116/90 162/53  Pulse: 58 59 67 76  Temp: 97.6 F (36.4 C)   98.2 F (36.8 C)  TempSrc: Oral   Oral  Resp: 14     Height:      Weight:      SpO2: 96% 91% 95% 91%      Intake/Output from previous day: I/O last 3 completed shifts: In: 1550 [I.V.:1550] Out: 50 [Blood:50]   Intake/Output this shift: Total I/O In: 2327.9 [P.O.:480; I.V.:1847.9] Out: 400 [Urine:400]   LABORATORY DATA:  Recent Labs  06/28/16 0735  06/28/16 1153 06/28/16 1656 06/28/16 2025 06/29/16 0341  WBC  --   --   --   --   --  7.5  HGB  --   --   --   --   --  13.0  HCT  --   --   --   --   --  41.7  PLT  --   --   --   --   --  152  NA  --   --   --   --   --  136  K  --   --   --   --   --  5.1  CL  --   --   --   --   --  106  CO2  --   --   --   --   --  21*  BUN  --   --   --   --   --  15  CREATININE  --   --   --   --   --  0.83  GLUCOSE  --   --   --   --   --  138*  GLUCAP  --   < > 114* 149* 111*  --   INR 1.16  --   --   --   --   --   CALCIUM  --   --   --   --   --  9.2  < > = values in this interval not displayed.  Examination: Neurologically intact ABD soft Neurovascular intact Sensation intact distally Intact pulses distally Dorsiflexion/Plantar flexion intact Incision: dressing C/D/I No cellulitis present Compartment soft}  Assessment:   1 Day Post-Op Procedure(s)  (LRB): TOTAL KNEE ARTHROPLASTY (Right) ADDITIONAL DIAGNOSIS: Expected Acute Blood Loss Anemia, a fib, HTN, CHF, Sleep apnea  Plan: PT/OT WBAT, CPM 5/hrs day until ROM 0-90 degrees, then D/C CPM DVT Prophylaxis:  SCDx72hrs, ASA 325 mg BID x 2 weeks DISCHARGE PLAN: Skilled Nursing Facility/Rehab, [facility] DISCHARGE NEEDS: HHPT, CPM, Walker and 3-in-1 comode seat     Valen Mascaro J 06/29/2016, 6:46 AM

## 2016-06-29 NOTE — Progress Notes (Signed)
Patient stated that she did ok with the [place] last night and wouldn't like to wear CPAP tonight.  Patient's family is suppose to bring her CPAP from home for her to go to SNF. RT will continue to monitor.

## 2016-06-29 NOTE — Progress Notes (Addendum)
Physical Therapy Treatment Patient Details Name: [name] MRN: [id number] DOB: [birth year] Today's Date: 06/29/2016    History of Present Illness Patient is an 82 y/o female s/p R TKA with PMH of HTN, OSA on CPAP, COPD, DM, hyperthyroidism, diastolic CHF, a-fib.    PT Comments    Pt required increased assist this pm.  Pt more stiff and unable to complete gait back from bathroom this pm.  PTA brought recliner in bathroom to assist patient off of commode.  Pt would continue to benefit from rehab to advance mobility and improve function.  Will continue therapy per POC in prep for next LOC.    Follow Up Recommendations  SNF     Equipment Recommendations  Rolling walker with 5" wheels    Recommendations for Other Services       Precautions / Restrictions Precautions Precautions: Fall Precaution Comments: watch O2 Restrictions Weight Bearing Restrictions: Yes Other Position/Activity Restrictions: WBAT    Mobility  Bed Mobility Overal bed mobility: Needs Assistance Bed Mobility: Supine to Sit     Supine to sit: Mod assist;Max assist     General bed mobility comments: Pt required increased assistance and appears more fatigued this pm.  Pt saturated in urine on arrival and required clean up in standing.  Pt required cues for hand placement and use of bed pad to advance to edge of bed.    Transfers Overall transfer level: Needs assistance Equipment used: Rolling walker (2 wheeled) Transfers: Sit to/from Stand Sit to Stand: Mod assist;Max assist (required increased assist this pm for sit to stand transfers.  ) Stand pivot transfers: Mod assist       General transfer comment: Pt performed with cues for hand placement and advancement of RLE forward to improve ease and decrease pain.  Pt presents with flexed posture in standing and required assistance to stand erect to improve safety.  Pt noted reaching for front of RW and educated to keep hands placed on hand grips to  maintain safety.    Ambulation/Gait Ambulation/Gait assistance: Mod assist;Max assist Ambulation Distance (Feet): 16 Feet (unable to ambulate back from bathroom secondary to fatigue.  ) Assistive device: Rolling walker (2 wheeled) Gait Pattern/deviations: Step-to pattern;Antalgic;Trunk flexed;Shuffle;Decreased stride length     General Gait Details: Remains to require cues for sequencing and upper trunk control.  Pt slow to move and required physical assistance to advance B LEs.  Pt appears more stiff this pm.     Stairs            Wheelchair Mobility    Modified Rankin (Stroke Patients Only)       Balance     Sitting balance-Leahy Scale: Good       Standing balance-Leahy Scale: Poor                      Cognition Arousal/Alertness: Awake/alert Behavior During Therapy: WFL for tasks assessed/performed;Anxious Overall Cognitive Status: Within Functional Limits for tasks assessed                      Exercises Total Joint Exercises Ankle Circles/Pumps: AROM;Both;Supine;10 reps Quad Sets: AROM;Right;10 reps;Supine Heel Slides: AAROM;Right;10 reps;Supine Hip ABduction/ADduction: AAROM;Right;10 reps;Supine Straight Leg Raises: AAROM;Right;10 reps;Supine    General Comments        Pertinent Vitals/Pain Pain Assessment: 0-10 Pain Score: 6  Pain Location: R LE Pain Descriptors / Indicators: Discomfort;Grimacing;Guarding Pain Intervention(s): Monitored during session;Repositioned    Home Living  Prior Function            PT Goals (current goals can now be found in the care plan section) Acute Rehab PT Goals Patient Stated Goal: To go to rehab prior to home Potential to Achieve Goals: Good Progress towards PT goals: Progressing toward goals    Frequency  7X/week    PT Plan Current plan remains appropriate    Co-evaluation             End of Session Equipment Utilized During Treatment: Gait  belt;Oxygen Activity Tolerance: Patient tolerated treatment well Patient left: in chair;with call bell/phone within reach     Time: [id number] PT Time Calculation (min) (ACUTE ONLY): 33 min  Charges:   $Gait Training: 8-22 mins $Therapeutic Activity: 8-22 mins                   G Codes:      Cristela Blue 07-12-16, 5:31 PM  Governor Rooks, PTA pager [phone]

## 2016-06-30 ENCOUNTER — Encounter (HOSPITAL_COMMUNITY): Payer: Self-pay | Admitting: Orthopedic Surgery

## 2016-06-30 DIAGNOSIS — E43 Unspecified severe protein-calorie malnutrition: Secondary | ICD-10-CM | POA: Diagnosis not present

## 2016-06-30 DIAGNOSIS — Z96651 Presence of right artificial knee joint: Secondary | ICD-10-CM | POA: Diagnosis not present

## 2016-06-30 DIAGNOSIS — I482 Chronic atrial fibrillation: Secondary | ICD-10-CM | POA: Diagnosis not present

## 2016-06-30 DIAGNOSIS — K5900 Constipation, unspecified: Secondary | ICD-10-CM | POA: Diagnosis not present

## 2016-06-30 DIAGNOSIS — R443 Hallucinations, unspecified: Secondary | ICD-10-CM | POA: Diagnosis not present

## 2016-06-30 DIAGNOSIS — M25569 Pain in unspecified knee: Secondary | ICD-10-CM | POA: Diagnosis not present

## 2016-06-30 DIAGNOSIS — I2510 Atherosclerotic heart disease of native coronary artery without angina pectoris: Secondary | ICD-10-CM | POA: Diagnosis not present

## 2016-06-30 DIAGNOSIS — R2681 Unsteadiness on feet: Secondary | ICD-10-CM | POA: Diagnosis not present

## 2016-06-30 DIAGNOSIS — R600 Localized edema: Secondary | ICD-10-CM | POA: Diagnosis not present

## 2016-06-30 DIAGNOSIS — Z471 Aftercare following joint replacement surgery: Secondary | ICD-10-CM | POA: Diagnosis not present

## 2016-06-30 DIAGNOSIS — D649 Anemia, unspecified: Secondary | ICD-10-CM | POA: Diagnosis not present

## 2016-06-30 DIAGNOSIS — M1711 Unilateral primary osteoarthritis, right knee: Secondary | ICD-10-CM | POA: Diagnosis not present

## 2016-06-30 DIAGNOSIS — M6281 Muscle weakness (generalized): Secondary | ICD-10-CM | POA: Diagnosis not present

## 2016-06-30 LAB — CBC
HEMATOCRIT: 40.3 % (ref 36.0–46.0)
Hemoglobin: 12.5 g/dL (ref 12.0–15.0)
MCH: 29.1 pg (ref 26.0–34.0)
MCHC: 31 g/dL (ref 30.0–36.0)
MCV: 93.7 fL (ref 78.0–100.0)
PLATELETS: 154 10*3/uL (ref 150–400)
RBC: 4.3 MIL/uL (ref 3.87–5.11)
RDW: 15.2 % (ref 11.5–15.5)
WBC: 12.1 10*3/uL — AB (ref 4.0–10.5)

## 2016-06-30 LAB — GLUCOSE, CAPILLARY
GLUCOSE-CAPILLARY: 214 mg/dL — AB (ref 65–99)
Glucose-Capillary: 189 mg/dL — ABNORMAL HIGH (ref 65–99)

## 2016-06-30 MED ORDER — WHITE PETROLATUM GEL
Status: AC
  Filled 2016-06-30: qty 1

## 2016-06-30 NOTE — NC FL2 (Signed)
[place] LEVEL OF CARE SCREENING TOOL     IDENTIFICATION  Patient Name: [name] Birthdate: 12-Feb-1931 Sex: female Admission Date (Current Location): 06/28/2016  [facility] and [place] Number:  Herbalist and Address:  [facility] [place] [address], [place], [place] [address]      Provider Number: [id number]  Attending Physician Name and Address:  Frederik Pear, MD  Relative Name and Phone Number:       Current Level of Care: Hospital Recommended Level of Care: [place] Prior Approval Number:    Date Approved/Denied:   PASRR Number: [id number]  Discharge Plan: SNF    Current Diagnoses: Patient Active Problem List   Diagnosis Date Noted  . Arthritis of right knee 06/28/2016  . Primary osteoarthritis of right knee 06/27/2016  . Pre-operative cardiovascular examination 06/11/2016  . Rash and nonspecific skin eruption 06/09/2016  . Pain of left lower extremity 06/09/2016  . Diabetes ([place]) 03/07/2016  . Acute encephalopathy   . Acute renal failure superimposed on stage 3 chronic kidney disease ([place])   . Acute on chronic diastolic CHF (congestive heart failure) ([place])   . Acute respiratory failure with hypoxia ([place]) 05/24/2015  . CAP (community acquired pneumonia) 05/24/2015  . Abnormal urinalysis 05/24/2015  . Sepsis ([place]) 05/24/2015  . OSA on CPAP 08/28/2014  . DJD (degenerative joint disease) of knee 08/16/2014  . Chronic diastolic heart failure ([place]) 07/30/2014  . Nocturnal hypoxemia 07/09/2014  . Dyspnea 05/15/2014  . Pulmonary HTN ([place]) 05/15/2014  . Crescendo angina ([place]) 05/02/2014  . Band keratopathy 01/16/2014  . 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 Epiphora due to insufficient drainage 06/13/2013  . Hyperthyroidism 11/04/2011  . Multinodular goiter 10/14/2011  . Atrophy, Fuchs' 09/29/2011  . LUMBAR RADICULOPATHY, RIGHT 09/23/2010  . ATRIAL FIBRILLATION 06/03/2010  . CHF  02/17/2010  . VITILIGO 11/07/2009  . SINOATRIAL NODE DYSFUNCTION 06/16/2009  . Iron deficiency anemia 01/02/2009  . ANEMIA, PERNICIOUS 11/20/2008  . DEGENERATIVE JOINT DISEASE 11/20/2008  . Atrial flutter ([place]) 06/25/2008  . HYPERLIPIDEMIA 01/29/2008  . Coronary atherosclerosis 01/29/2008  . HTN (hypertension) 10/30/2007    Orientation RESPIRATION BLADDER Height & Weight     Self, Time, Situation, Place  Normal Continent Weight: 181 lb (82.101 kg) Height:  5\' 4"  (162.6 cm)  BEHAVIORAL SYMPTOMS/MOOD NEUROLOGICAL BOWEL NUTRITION STATUS      Continent Diet (Please see discharge summary.)  AMBULATORY STATUS COMMUNICATION OF NEEDS Skin   Limited Assist Verbally Surgical wounds                       Personal Care Assistance Level of Assistance  Bathing, Feeding, Dressing Bathing Assistance: Limited assistance Feeding assistance: Limited assistance Dressing Assistance: Limited assistance     Functional Limitations Info             SPECIAL CARE FACTORS FREQUENCY  PT (By licensed PT), OT (By licensed OT)     PT Frequency: 5 OT Frequency: 5            Contractures      Additional Factors Info  Code Status, Allergies Code Status Info: FULL Allergies Info: Amlodipine Besylate, Levaquin, Lobster, Penicillins, Valsartan, Codeine, Tramadol Hcl           Current Medications (06/30/2016):  This is the current hospital active medication list Current Facility-Administered Medications  Medication Dose Route Frequency Provider Last Rate Last Dose  . acetaminophen (TYLENOL) tablet 650 mg  650  mg Oral Q6H PRN Leighton Parody, PA-C       Or  . acetaminophen (TYLENOL) suppository 650 mg  650 mg Rectal Q6H PRN Leighton Parody, PA-C      . alum & mag hydroxide-simeth (MAALOX/MYLANTA) 200-200-20 MG/5ML suspension 30 mL  30 mL Oral Q4H PRN Leighton Parody, PA-C      . apixaban Arne [place]) tablet 2.5 mg  2.5 mg Oral Q12H Leighton Parody, PA-C   2.5 mg at 06/30/16 0741  .  bisacodyl (DULCOLAX) EC tablet 5 mg  5 mg Oral Daily PRN Leighton Parody, PA-C      . carvedilol (COREG) tablet 12.5 mg  12.5 mg Oral BID WC Leighton Parody, PA-C   12.5 mg at 06/30/16 0741  . dextrose 5 %-0.45 % sodium chloride infusion   Intravenous Continuous Milly Jakob, MD 20 mL/hr at 06/29/16 1900    . diphenhydrAMINE (BENADRYL) 12.5 MG/5ML elixir 12.5-25 mg  12.5-25 mg Oral Q4H PRN Leighton Parody, PA-C      . docusate sodium (COLACE) capsule 100 mg  100 mg Oral BID Leighton Parody, PA-C   100 mg at 06/30/16 0940  . furosemide (LASIX) tablet 80 mg  80 mg Oral Daily Leighton Parody, PA-C   80 mg at 06/30/16 0940  . HYDROmorphone (DILAUDID) injection 0.5 mg  0.5 mg Intravenous Q2H PRN Leighton Parody, PA-C      . insulin aspart (novoLOG) injection 0-15 Units  0-15 Units Subcutaneous TID WC Leighton Parody, PA-C   3 Units at 06/30/16 [phone]  . insulin NPH Human (HUMULIN N,NOVOLIN N) injection 40 Units  40 Units Subcutaneous QAC breakfast Leighton Parody, PA-C   40 Units at 06/30/16 [id number]  . menthol-cetylpyridinium (CEPACOL) lozenge 3 mg  1 lozenge Oral PRN Leighton Parody, PA-C       Or  . phenol (CHLORASEPTIC) mouth spray 1 spray  1 spray Mouth/Throat PRN Leighton Parody, PA-C      . methimazole (TAPAZOLE) tablet 5 mg  5 mg Oral Once per day on Mon Thu Eric K Phillips, PA-C      . methocarbamol (ROBAXIN) tablet 500 mg  500 mg Oral Q6H PRN Leighton Parody, PA-C   500 mg at 06/30/16 [id number]   Or  . methocarbamol (ROBAXIN) 500 mg in dextrose 5 % 50 mL IVPB  500 mg Intravenous Q6H PRN Leighton Parody, PA-C      . metoCLOPramide (REGLAN) tablet 5-10 mg  5-10 mg Oral Q8H PRN Leighton Parody, PA-C   5 mg at 06/28/16 2050   Or  . metoCLOPramide (REGLAN) injection 5-10 mg  5-10 mg Intravenous Q8H PRN Leighton Parody, PA-C      . nitroGLYCERIN (NITROSTAT) SL tablet 0.4 mg  0.4 mg Sublingual Q5 min PRN Leighton Parody, PA-C      . ondansetron [facility]) tablet 4 mg  4 mg Oral Q6H PRN Leighton Parody, PA-C        Or  . ondansetron [facility]) injection 4 mg  4 mg Intravenous Q6H PRN Leighton Parody, PA-C   4 mg at 06/28/16 1652  . oxyCODONE (Oxy IR/ROXICODONE) immediate release tablet 5-10 mg  5-10 mg Oral Q3H PRN Leighton Parody, PA-C   10 mg at 06/30/16 0951  . senna-docusate (Senokot-S) tablet 1 tablet  1 tablet Oral QHS PRN Leighton Parody, PA-C      . sodium phosphate (FLEET) 7-19 GM/118ML enema 1 enema  1 enema Rectal Once PRN Leighton Parody, PA-C      . tiotropium [facility]) inhalation capsule 18 mcg  18 mcg Inhalation Daily Leighton Parody, PA-C   18 mcg at 06/30/16 0815  . white petrolatum (VASELINE) gel              Discharge Medications: Please see discharge summary for a list of discharge medications.  Relevant Imaging Results:  Relevant Lab Results:   Additional Information SSN: [id number]  Caroline Sauger, LCSW

## 2016-06-30 NOTE — Discharge Summary (Signed)
Patient ID: [name] MRN: [id number] DOB/AGE: 80/24/1932 4 y.o.  Admit date: 06/28/2016 Discharge date: 06/30/2016  Admission Diagnoses:  Principal Problem:   Primary osteoarthritis of right knee Active Problems:   Arthritis of right knee   Discharge Diagnoses:  Same  Past Medical History  Diagnosis Date  . Hypopotassemia     PMH of  . Anemia     iron defficiency  . Muscle pain   . Atrial fibrillation or flutter     s/p RFCA 7/08;   s/p DCCV in past;   previously on amiodarone;  amio stopped due to lung toxicity  . CAD (coronary artery disease)     s/p NSTEMI tx with BMS to OM1 3/08;  cath 3/08: pOM 99% tx with PCI, pLAD 20%, ? mod stenosis at the AM  . HLD (hyperlipidemia)   . HTN (hypertension)     essential nos  . Dystrophy, corneal stromal   . Chronic diastolic heart failure ([place])     echo 11/11:  EF 55-60%, severe LVH, mod LAE, mild MR, mildly increased PASP  . Osteoporosis   . Heart murmur   . Anginal pain ([place])   . Myocardial infarction ([place]) 2008  . COPD (chronic obstructive pulmonary disease) ([place])   . Asthma   . Pneumonia 05/27/2015  . OSA on CPAP   . Type II diabetes mellitus ([facility])     Dr Loanne Drilling  . Seasonal allergies   . Degenerative joint disease   . Arthritis     "knees, feet, hands; joints" (05/27/2015)  . Shortness of breath dyspnea   . Rash and nonspecific skin eruption     both arms,awaiting bio   dr Delman Cheadle    Surgeries: Procedure(s): TOTAL KNEE ARTHROPLASTY on 06/28/2016   Consultants:    Discharged Condition: Improved  Hospital Course: [name] is an 82 y.o. female who was admitted 06/28/2016 for operative treatment ofPrimary osteoarthritis of right knee. Patient has severe unremitting pain that affects sleep, daily activities, and work/hobbies. After pre-op clearance the patient was taken to the operating room on 06/28/2016 and underwent  Procedure(s): TOTAL KNEE ARTHROPLASTY.    Patient was given perioperative antibiotics:  Anti-infectives    Start     Dose/Rate Route Frequency Ordered Stop   06/28/16 1034  cefUROXime (ZINACEF) injection  Status:  Discontinued       As needed 06/28/16 1035 06/28/16 1145   06/28/16 0600  vancomycin (VANCOCIN) IVPB 1000 mg/200 mL premix     1,000 mg 200 mL/hr over 60 Minutes Intravenous To [facility]Stay Surgical 06/27/16 1359 06/28/16 0843       Patient was given sequential compression devices, early ambulation, and chemoprophylaxis to prevent DVT.  Patient benefited maximally from hospital stay and there were no complications.    Recent vital signs: Patient Vitals for the past 24 hrs:  BP Temp Temp src Pulse Resp SpO2  06/30/16 0630 (!) 132/58 mmHg 98.4 F (36.9 C) - 87 16 95 %  06/29/16 2100 (!) 143/51 mmHg 98.7 F (37.1 C) Oral 91 17 95 %  06/29/16 1300 (!) 158/60 mmHg 97.7 F (36.5 C) Oral 87 16 96 %  06/29/16 0906 - - - - - 95 %     Recent laboratory studies:  Recent Labs  06/28/16 0735 06/29/16 0341 06/30/16 0315  WBC  --  7.5 12.1*  HGB  --  13.0 12.5  HCT  --  41.7 40.3  PLT  --  152 154  NA  --  136  --   K  --  5.1  --   CL  --  106  --   CO2  --  21*  --   BUN  --  15  --   CREATININE  --  0.83  --   GLUCOSE  --  138*  --   INR 1.16  --   --   CALCIUM  --  9.2  --      Discharge Medications:     Medication List    TAKE these medications        apixaban 2.5 MG Tabs tablet  Commonly known as:  ELIQUIS  Take 1 tablet (2.5 mg total) by mouth 2 (two) times daily.     carvedilol 12.5 MG tablet  Commonly known as:  COREG  Take 1 tablet (12.5 mg total) by mouth 2 (two) times daily with a meal.     CENTRUM SILVER ULTRA WOMENS PO  Take by mouth.     furosemide 40 MG tablet  Commonly known as:  LASIX  Take 2 tablets (80 mg total) by mouth daily.     glucose blood test strip  Commonly known as:  ONE TOUCH TEST STRIPS  Use to check blood sugar 2 times per day. Dx Code E11.9 (ONE TOUCH VERIO)     insulin NPH Human 100 UNIT/ML injection   Commonly known as:  HUMULIN N,NOVOLIN N  Inject 40 Units into the skin daily.     methimazole 5 MG tablet  Commonly known as:  TAPAZOLE  Take 5 mg by mouth 2 (two) times a week.     nitroGLYCERIN 0.4 MG SL tablet  Commonly known as:  NITROSTAT  Place 0.4 mg under the tongue every 5 (five) minutes as needed for chest pain. Reported on 01/28/2016     onetouch ultrasoft lancets  Use to check blood sugar 2 times per day. Dx Code E11.9     oxyCODONE-acetaminophen 5-325 MG tablet  Commonly known as:  ROXICET  Take 1 tablet by mouth every 4 (four) hours as needed.     potassium chloride SA 20 MEQ tablet  Commonly known as:  K-DUR,KLOR-CON  Take 20-40 mEq by mouth daily. Take 1 tablet (63mEq) daily alternating with 2 tablets (76mEq) daily)     tiotropium 18 MCG inhalation capsule  Commonly known as:  SPIRIVA  Place 1 capsule (18 mcg total) into inhaler and inhale daily.     tizanidine 2 MG capsule  Commonly known as:  ZANAFLEX  Take 1 capsule (2 mg total) by mouth 3 (three) times daily.     TURMERIC PO  Take 1 tablet by mouth daily.     Vitamin D (Cholecalciferol) 1000 units Tabs  Take 2,000 Units by mouth daily.        Diagnostic Studies: Dg Hip Unilat With Pelvis 2-3 Views Left  06/04/2016  CLINICAL DATA:  Left hip EXAM: DG HIP (WITH OR WITHOUT PELVIS) 2-3V LEFT COMPARISON:  None. FINDINGS: The pelvic bones are intact. There is mild-to-moderate sacroiliitis there is mild to moderate pubic symphysitis. There is moderate bilateral hip joint space narrowing with mild osteophyte formation. This is symmetric bilaterally. Both femoral heads maintain a normal rounded configuration. IMPRESSION: Moderate bilateral hip arthritis. Electronically Signed   By: Skipper Cliche M.D.   On: 06/04/2016 17:55    Disposition: 01-Home or Self Care      Discharge Instructions    CPM    Complete by:  As directed  Continuous passive motion machine (CPM):      Use the CPM from 0 to 60  for 5 hours  per day.      You may increase by 10 degrees per day.  You may break it up into 2 or 3 sessions per day.      Use CPM for 2 weeks or until you are told to stop.     Call MD / Call 911    Complete by:  As directed   If you experience chest pain or shortness of breath, CALL 911 and be transported to the hospital emergency room.  If you develope a fever above 101 F, pus (white drainage) or increased drainage or redness at the wound, or calf pain, call your surgeon's office.     Constipation Prevention    Complete by:  As directed   Drink plenty of fluids.  Prune juice may be helpful.  You may use a stool softener, such as Colace (over the counter) 100 mg twice a day.  Use MiraLax (over the counter) for constipation as needed.     Diet - low sodium heart healthy    Complete by:  As directed      Driving restrictions    Complete by:  As directed   No driving for 2 weeks     Increase activity slowly as tolerated    Complete by:  As directed      Patient may shower    Complete by:  As directed   You may shower without a dressing once there is no drainage.  Do not wash over the wound.  If drainage remains, cover wound with plastic wrap and then shower.           Follow-up Information    Follow up with Kerin Salen, MD In 2 weeks.   Specialty:  Orthopedic Surgery   Contact information:   [place] [id number] [phone]        Signed: Hardin Negus Ellisa Devivo R 06/30/2016, 7:59 AM

## 2016-06-30 NOTE — Progress Notes (Signed)
Physical Therapy Treatment Patient Details Name: [name] MRN: [id number] DOB: 1931/08/01 Today's Date: 06/30/2016    History of Present Illness Patient is an 82 y/o female s/p R TKA with PMH of HTN, OSA on CPAP, COPD, DM, hyperthyroidism, diastolic CHF, a-fib.    PT Comments    Pt performed advance gait distance remains to require mod assist with max VCs.  Pt fatigues easily during mobility.  Will f/u this pm.    Follow Up Recommendations  SNF     Equipment Recommendations  Rolling walker with 5" wheels    Recommendations for Other Services       Precautions / Restrictions Precautions Precautions: Fall Precaution Comments: watch O2 Restrictions Weight Bearing Restrictions: Yes RLE Weight Bearing: Weight bearing as tolerated Other Position/Activity Restrictions: WBAT    Mobility  Bed Mobility Overal bed mobility: Needs Assistance Bed Mobility: Supine to Sit     Supine to sit: Mod assist     General bed mobility comments: Pt moving better this am.  Required assist for LEs to advnace to edge of bed.   Pt able to elevate trunk into seated position with min guard.  Overall mod assist for LE movement.    Transfers Overall transfer level: Needs assistance Equipment used: Rolling walker (2 wheeled) Transfers: Sit to/from Stand Sit to Stand: Mod assist         General transfer comment: Pt performed with cues for hand placement and advancement of RLE forward to improve ease and decrease pain.  Pt presents with flexed posture in standing and required assistance to stand erect to improve safety. Pt required tactile cueing and assistance for hand placement.   Ambulation/Gait Ambulation/Gait assistance: Mod assist Ambulation Distance (Feet): 18 Feet Assistive device: Rolling walker (2 wheeled) Gait Pattern/deviations: Step-to pattern;Antalgic;Trunk flexed;Shuffle;Decreased stride length     General Gait Details: Pt performed increased mobility this am.   Required BSC before gait training but able to stand from bedside commode and progress to gait training.  Pt required max cues for technique and to correct posture for safety.  PTA facilitated weight shifting to aid in advancement of steps.     Stairs            Wheelchair Mobility    Modified Rankin (Stroke Patients Only)       Balance                                    Cognition Arousal/Alertness: Awake/alert Behavior During Therapy: WFL for tasks assessed/performed;Anxious Overall Cognitive Status: Within Functional Limits for tasks assessed                      Exercises Total Joint Exercises Ankle Circles/Pumps: AROM;Both;Supine;10 reps Quad Sets: AROM;Right;10 reps;Supine Heel Slides: AAROM;Right;10 reps;Supine Hip ABduction/ADduction: AAROM;Right;10 reps;Supine Straight Leg Raises: AAROM;Right;10 reps;Supine Goniometric ROM: 4-63 degrees AAROM    General Comments        Pertinent Vitals/Pain Pain Assessment: 0-10 Faces Pain Scale: Hurts even more Pain Location: R LE.   Pain Descriptors / Indicators: Discomfort;Guarding;Grimacing Pain Intervention(s): Monitored during session;Repositioned    Home Living                      Prior Function            PT Goals (current goals can now be found in the care plan section) Acute Rehab PT Goals Patient  Stated Goal: To go to rehab prior to home Potential to Achieve Goals: Good Progress towards PT goals: Progressing toward goals    Frequency  7X/week    PT Plan Current plan remains appropriate    Co-evaluation             End of Session Equipment Utilized During Treatment: Gait belt;Oxygen Activity Tolerance: Patient tolerated treatment well Patient left: in chair;with call bell/phone within reach     Time: 0913-0943 PT Time Calculation (min) (ACUTE ONLY): 30 min  Charges:  $Gait Training: 8-22 mins $Therapeutic Activity: 8-22 mins                    G  Codes:      Cristela Blue 07/06/16, 9:52 AM  Governor Rooks, PTA pager [phone]

## 2016-06-30 NOTE — Progress Notes (Signed)
PATIENT ID: [name]  MRN: [id number]  DOB/AGE:  01-11-1931 / 82 y.o.  2 Days Post-Op Procedure(s) (LRB): TOTAL KNEE ARTHROPLASTY (Right)    PROGRESS NOTE Subjective: Patient is alert, oriented, no Nausea, no Vomiting, yes passing gas. Taking PO well with pt eating in bed. Denies SOB, Chest or Calf Pain. Using Incentive Spirometer, PAS in place. Ambulate WBAT with pt walking 16 ft x 2 with therapy, CPM 0-40 Patient reports pain as 8/10 With activity.    Objective: Vital signs in last 24 hours: Filed Vitals:   06/29/16 0906 06/29/16 1300 06/29/16 2100 06/30/16 0630  BP:  158/60 143/51 132/58  Pulse:  87 91 87  Temp:  97.7 F (36.5 C) 98.7 F (37.1 C) 98.4 F (36.9 C)  TempSrc:  Oral Oral   Resp:  16 17 16   Height:      Weight:      SpO2: 95% 96% 95% 95%      Intake/Output from previous day: I/O last 3 completed shifts: In: 3752.9 [P.O.:1200; I.V.:2552.9] Out: 400 [Urine:400]   Intake/Output this shift:     LABORATORY DATA:  Recent Labs  06/28/16 0735  06/29/16 0341  06/29/16 1635 06/29/16 2155 06/30/16 0315 06/30/16 0719  WBC  --   --  7.5  --   --   --  12.1*  --   HGB  --   --  13.0  --   --   --  12.5  --   HCT  --   --  41.7  --   --   --  40.3  --   PLT  --   --  152  --   --   --  154  --   NA  --   --  136  --   --   --   --   --   K  --   --  5.1  --   --   --   --   --   CL  --   --  106  --   --   --   --   --   CO2  --   --  21*  --   --   --   --   --   BUN  --   --  15  --   --   --   --   --   CREATININE  --   --  0.83  --   --   --   --   --   GLUCOSE  --   --  138*  --   --   --   --   --   GLUCAP  --   < >  --   < > 198* 191*  --  189*  INR 1.16  --   --   --   --   --   --   --   CALCIUM  --   --  9.2  --   --   --   --   --   < > = values in this interval not displayed.  Examination: Neurologically intact Neurovascular intact Sensation intact distally Intact pulses distally Dorsiflexion/Plantar flexion intact Incision:  dressing C/D/I No cellulitis present Compartment soft}  Assessment:   2 Days Post-Op Procedure(s) (LRB): TOTAL KNEE ARTHROPLASTY (Right) ADDITIONAL DIAGNOSIS: Expected Acute Blood Loss Anemia, Diabetes, a fib, HTN, CHF, Sleep apnea.  Plan: PT/OT WBAT,  CPM 5/hrs day until ROM 0-90 degrees, then D/C CPM DVT Prophylaxis:  SCDx72hrs, Eliquis 2.5 mg BID x 2 weeks, then restart normal dose DISCHARGE PLAN: Skilled Nursing Facility/Rehab, [place] when bed available DISCHARGE NEEDS: HHPT, CPM, Walker and 3-in-1 comode seat     Harris Penton R 06/30/2016, 7:55 AM

## 2016-06-30 NOTE — Clinical Social Work Placement (Signed)
   CLINICAL SOCIAL WORK PLACEMENT  NOTE  Date:  06/30/2016  Patient Details  Name: [name] MRN: [id number] Date of Birth: 08/20/1931  Clinical Social Work is seeking post-discharge placement for this patient at the [place] level of care (*CSW will initial, date and re-position this form in  chart as items are completed):  Yes   Patient/family provided with [facility] [facility] Work Department's list of facilities offering this level of care within the geographic area requested by the patient (or if unable, by the patient's family).  Yes   Patient/family informed of their freedom to choose among providers that offer the needed level of care, that participate in Medicare, Medicaid or managed care program needed by the patient, have an available bed and are willing to accept the patient.  Yes   Patient/family informed of [facility]'s ownership interest in [facility] and [facility], as well as of the fact that they are under no obligation to receive care at these facilities.  PASRR submitted to EDS on       PASRR number received on       Existing PASRR number confirmed on 06/30/16     FL2 transmitted to all facilities in geographic area requested by pt/family on 06/30/16     FL2 transmitted to all facilities within larger geographic area on       Patient informed that his/her managed care company has contracts with or will negotiate with certain facilities, including the following:        Yes   Patient/family informed of bed offers received.  Patient chooses bed at [facility]     Physician recommends and patient chooses bed at      Patient to be transferred to [facility] on 06/30/16.  Patient to be transferred to facility by [facility]     Patient family notified on 06/30/16 of transfer.  Name of family member notified:  Patient     PHYSICIAN       Additional Comment:    _______________________________________________ Caroline Sauger, LCSW 06/30/2016, 11:07 AM

## 2016-06-30 NOTE — Progress Notes (Signed)
Occupational Therapy Treatment Patient Details Name: [name] MRN: [id number] DOB: 1931/08/05 Today's Date: 06/30/2016    History of present illness Patient is an 82 y/o female s/p R TKA with PMH of HTN, OSA on CPAP, COPD, DM, hyperthyroidism, diastolic CHF, a-fib.   OT comments  Pt moving slowly with verbal cues and encouragement. Pt able to do hygiene sitting in bed with set up. Pt able to stand with mod assist for toilet transfer with BSC with max assist from therapist. Pt limited by pain and fatigue.   Follow Up Recommendations  SNF;Supervision/Assistance - 24 hour    Equipment Recommendations  Other (comment) (TBD at next level of care)    Recommendations for Other Services      Precautions / Restrictions Precautions Precautions: Fall Precaution Comments: watch O2 Restrictions Weight Bearing Restrictions: Yes RLE Weight Bearing: Weight bearing as tolerated Other Position/Activity Restrictions: WBAT       Mobility Bed Mobility Overal bed mobility: Needs Assistance Bed Mobility: Supine to Sit     Supine to sit: Mod assist     General bed mobility comments: Pt moving better this am.  Required assist for LEs to advnace to edge of bed.   Pt able to elevate trunk into seated position with min guard.  Overall mod assist for LE movement.    Transfers Overall transfer level: Needs assistance Equipment used: Rolling walker (2 wheeled) Transfers: Sit to/from Stand Sit to Stand: Mod assist Stand pivot transfers: Mod assist       General transfer comment: Pt performed with cues for hand placement and advancement of RLE forward to improve ease and decrease pain.  Pt presents with flexed posture in standing and required assistance to stand erect to improve safety. Pt required tactile cueing and assistance for hand placement.     Balance                                   ADL Overall ADL's : Needs assistance/impaired         Upper Body Bathing:  Set up;Sitting;Min guard Upper Body Bathing Details (indicate cue type and reason): washed face             Toilet Transfer: Moderate assistance;BSC;RW (sit to stand with therapist moving in behind) Toilet Transfer Details (indicate cue type and reason): Pt limited by stiffness and pain at surgical site, slow moving. sit to stand with vc for safe hand placement Toileting- Clothing Manipulation and Hygiene: Maximal assistance Toileting - Clothing Manipulation Details (indicate cue type and reason): able to wipe after toileting with min assist while standing              Vision                     Perception     Praxis      Cognition   Behavior During Therapy: [facility] for tasks assessed/performed;Anxious Overall Cognitive Status: Within Functional Limits for tasks assessed                       Extremity/Trunk Assessment               Exercises    Shoulder Instructions       General Comments      Pertinent Vitals/ Pain       Pain Assessment: 0-10 Pain Score: 7  Faces Pain Scale: Hurts even  more Pain Location: R LE.   Pain Descriptors / Indicators: Discomfort;Guarding;Grimacing Pain Intervention(s): Monitored during session;Repositioned  Home Living                                          Prior Functioning/Environment              Frequency Min 2X/week     Progress Toward Goals  OT Goals(current goals can now be found in the care plan section)  Progress towards OT goals: Progressing toward goals  Acute Rehab OT Goals Patient Stated Goal: To go to rehab prior to home Time For Goal Achievement: 07/06/16 Potential to Achieve Goals: Good ADL Goals Pt Will Perform Grooming: with min assist;standing Pt Will Perform Lower Body Bathing: with mod assist;sitting/lateral leans;sit to/from stand Pt Will Transfer to Toilet: with min guard assist;bedside commode;ambulating;grab bars Pt Will Perform Toileting - Clothing  Manipulation and hygiene: with mod assist;sit to/from stand Additional ADL Goal #1: pt will complete UB ADLs set up/sup seated unsupported  Plan Discharge plan remains appropriate    Co-evaluation                 End of Session Equipment Utilized During Treatment: Gait belt;Rolling walker;Other (comment) (BSC)   Activity Tolerance Patient limited by fatigue;Patient limited by pain   Patient Left in chair;Other (comment) (with PTA)   Nurse Communication          [id number] OT Time Calculation (min): 45 min  Charges: OT General Charges $OT Visit: 1 Procedure OT Treatments $Self Care/Home Management : 8-22 mins $Therapeutic Activity: 23-37 mins  Merri Ray Yola Paradiso 06/30/2016, 10:02 AM

## 2016-06-30 NOTE — Progress Notes (Signed)
Report called to [place] at [facility]

## 2016-06-30 NOTE — Clinical Social Work Note (Signed)
Clinical Social Work Assessment  Patient Details  Name: [name] MRN: [id number] Date of Birth: 10-30-1931  Date of referral:  06/30/16               Reason for consult:  Facility Placement, Discharge Planning                Permission sought to share information with:  Chartered certified accountant granted to share information::  Yes, Verbal Permission Granted  Name::        Agency::  [facility]  Relationship::     Contact Information:     Housing/Transportation Living arrangements for the past 2 months:  Single Family Home Source of Information:  Patient Patient Interpreter Needed:  None Criminal Activity/Legal Involvement Pertinent to Current Situation/Hospitalization:  No - Comment as needed Significant Relationships:  Other(Comment) (Family) Lives with:  Self Do you feel safe going back to the place where you live?  No Need for family participation in patient care:  No (Coment) (Patient able to make own decisions.)  Care giving concerns:  Patient expressed no concerns at this time.   Social Worker assessment / plan:  LCSW received referral for possible SNF placement. Per report, patient has preregistered for placement at [facility]. LCSW to continue to follow and assist with discharge to [facility] once medically stable for discharge.  Employment status:  Retired Science writer) PT Recommendations:  [place] / Referral to community resources:  [place]  Patient/Family's Response to care:  Patient understanding and agreeable to [employer] of care.  Patient/Family's Understanding of and Emotional Response to Diagnosis, Current Treatment, and Prognosis:  Patient understanding and agreeable to LCSW plan of care.  Emotional Assessment Appearance:  Appears stated age Attitude/Demeanor/Rapport:  Other (Appropriate) Affect (typically observed):  Accepting,  Appropriate, Pleasant Orientation:  Oriented to Self, Oriented to Place, Oriented to  Time, Oriented to Situation Alcohol / Substance use:  Not Applicable Psych involvement (Current and /or in the community):  No (Comment) (Not appropriate on this admission.)  Discharge Needs  Concerns to be addressed:  No discharge needs identified Readmission within the last 30 days:  No Current discharge risk:  None Barriers to Discharge:  No Barriers Identified   Caroline Sauger, LCSW 06/30/2016, 11:01 AM [phone]

## 2016-06-30 NOTE — Care Management Important Message (Signed)
Important Message  Patient Details  Name: ROSAELENA CROOKER MRN: [id number] Date of Birth: 1931-01-06   Medicare Important Message Given:  Yes    Loann Quill 06/30/2016, 11:38 AM

## 2016-07-01 ENCOUNTER — Non-Acute Institutional Stay (SKILLED_NURSING_FACILITY): Payer: Medicare Other | Admitting: Internal Medicine

## 2016-07-01 ENCOUNTER — Encounter: Payer: Self-pay | Admitting: Internal Medicine

## 2016-07-01 ENCOUNTER — Telehealth: Payer: Self-pay | Admitting: *Deleted

## 2016-07-01 DIAGNOSIS — M1711 Unilateral primary osteoarthritis, right knee: Secondary | ICD-10-CM

## 2016-07-01 DIAGNOSIS — K5900 Constipation, unspecified: Secondary | ICD-10-CM

## 2016-07-01 DIAGNOSIS — Z9989 Dependence on other enabling machines and devices: Secondary | ICD-10-CM

## 2016-07-01 DIAGNOSIS — E1122 Type 2 diabetes mellitus with diabetic chronic kidney disease: Secondary | ICD-10-CM

## 2016-07-01 DIAGNOSIS — E876 Hypokalemia: Secondary | ICD-10-CM

## 2016-07-01 DIAGNOSIS — R2681 Unsteadiness on feet: Secondary | ICD-10-CM

## 2016-07-01 DIAGNOSIS — N183 Chronic kidney disease, stage 3 unspecified: Secondary | ICD-10-CM

## 2016-07-01 DIAGNOSIS — D72829 Elevated white blood cell count, unspecified: Secondary | ICD-10-CM

## 2016-07-01 DIAGNOSIS — I5032 Chronic diastolic (congestive) heart failure: Secondary | ICD-10-CM

## 2016-07-01 DIAGNOSIS — R443 Hallucinations, unspecified: Secondary | ICD-10-CM | POA: Diagnosis not present

## 2016-07-01 DIAGNOSIS — Z794 Long term (current) use of insulin: Secondary | ICD-10-CM

## 2016-07-01 DIAGNOSIS — I482 Chronic atrial fibrillation, unspecified: Secondary | ICD-10-CM

## 2016-07-01 DIAGNOSIS — G4733 Obstructive sleep apnea (adult) (pediatric): Secondary | ICD-10-CM | POA: Diagnosis not present

## 2016-07-01 DIAGNOSIS — R600 Localized edema: Secondary | ICD-10-CM

## 2016-07-01 DIAGNOSIS — I2510 Atherosclerotic heart disease of native coronary artery without angina pectoris: Secondary | ICD-10-CM

## 2016-07-01 NOTE — Telephone Encounter (Signed)
Pt was on TCM list list admitted for surgery had total (R) hip replacement. Was d/c 7/5, and will be f/u w/Dr. Mayer Camel in 2 wks...[name]

## 2016-07-01 NOTE — Progress Notes (Signed)
LOCATION: [facility]  PCP: [name] Rail, MD   Code Status: DNR  Goals of care: Advanced Directive information Advanced Directives 07/01/2016  Does patient have an advance directive? Yes  Type of Advance Directive Out of facility DNR (pink MOST or yellow form)  Does patient want to make changes to advanced directive? No - Patient declined  Copy of advanced directive(s) in chart? Yes       Extended Emergency Contact Information Primary Emergency Contact: [name],[name] Address: [address]          [place], [place] [address] [place] Phone: [phone] Mobile Phone: [phone] Relation: Son Secondary Emergency Contact: [name] Address: [place], [place] [address] [name] of [place] Phone: [phone] Mobile Phone: [phone] Relation: Daughter   Allergies  Allergen Reactions  . Amlodipine Besylate Other (See Comments)    REACTION: tingling in lips & gum edema  . Levaquin [Levofloxacin] Shortness Of Breath and Swelling    angioedema  . Lobster [Shellfish Allergy] Other (See Comments)    angioedema  . Penicillins Rash  . Valsartan Other (See Comments)    REACTION: angioedema  . Codeine Other (See Comments)    Mental status changes  . Oxycodone   . Tramadol Hcl Nausea And Vomiting    Chief Complaint  Patient presents with  . Readmit To SNF    Readmission     HPI:  Patient is a 82 y.o. female seen today for short term rehabilitation post [name] admission from 06/28/16-06/30/16 with right knee OA. She underwent right total knee arthroplasty. She is seen in her room today. She does not feel herself and has hallucination post pain pill. Her pain is under control.   Review of Systems:  Constitutional: Negative for fever, chills, diaphoresis. Energy level is slowly coming back.  HENT: Negative for headache, congestion, nasal discharge, difficulty swallowing. Positive for some sore throat.    Eyes: Negative for blurred vision, double vision and discharge.  Respiratory: Negative for cough and wheezing. Positive for chronic shortness of breath. Uses CPAP at bedtime.   Cardiovascular: Negative for chest pain, palpitations, leg swelling.  Gastrointestinal: Negative for heartburn, nausea, vomiting, abdominal pain. Last bowel movement was Saturday. Genitourinary: Negative for dysuria and flank pain.  Musculoskeletal: Negative for back pain, fall in the facility.  Skin: Negative for itching, rash.  Neurological: Negative for dizziness. Psychiatric/Behavioral: Negative for depression   Past Medical History  Diagnosis Date  . Hypopotassemia     PMH of  . Anemia     iron defficiency  . Muscle pain   . Atrial fibrillation or flutter     s/p RFCA 7/08;   s/p DCCV in past;   previously on amiodarone;  amio stopped due to lung toxicity  . CAD (coronary artery disease)     s/p NSTEMI tx with BMS to OM1 3/08;  cath 3/08: pOM 99% tx with PCI, pLAD 20%, ? mod stenosis at the AM  . HLD (hyperlipidemia)   . HTN (hypertension)     essential nos  . Dystrophy, corneal stromal   . Chronic diastolic heart failure ([facility])     echo 11/11:  EF 55-60%, severe LVH, mod LAE, mild MR, mildly increased PASP  . Osteoporosis   . Heart murmur   . Anginal pain ([facility])   . Myocardial infarction ([facility]) 2008  . COPD (chronic obstructive pulmonary disease) ([place])   . Asthma   .  Pneumonia 05/27/2015  . OSA on CPAP   . Type II diabetes mellitus ([facility])     Dr Loanne Drilling  . Seasonal allergies   . Degenerative joint disease   . Arthritis     "knees, feet, hands; joints" (05/27/2015)  . Shortness of breath dyspnea   . Rash and nonspecific skin eruption     both arms,awaiting bio   dr Delman Cheadle   Past Surgical History  Procedure Laterality Date  . Cholecystectomy    . Knee cartilage surgery Right   . Eye surgery    . A flutter ablation      Dr Lovena Le  . Gravid 2 para 2    . Colonoscopy  6/07    2 polyps Dr.  Kinnie Feil in [place]  . Coronary angioplasty with stent placement  02/2007    BMS w/Dr Lovena Le  . Colonoscopy with polypectomy       X 2; [facility]  . Left and right heart catheterization with coronary angiogram N/A 05/07/2014    Procedure: LEFT AND RIGHT HEART CATHETERIZATION WITH CORONARY ANGIOGRAM;  Surgeon: Peter M Martinique, MD;  Location: [facility] CATH LAB;  Service: Cardiovascular;  Laterality: N/A;  . Cataract extraction w/ intraocular lens  implant, bilateral Bilateral   . Corneal transplant Bilateral   . Total knee arthroplasty Right 06/28/2016    Procedure: TOTAL KNEE ARTHROPLASTY;  Surgeon: Frederik Pear, MD;  Location: [facility];  Service: Orthopedics;  Laterality: Right;   Social History:   reports that she quit smoking about 47 years ago. Her smoking use included Cigarettes. She has a 4.5 pack-year smoking history. She has never used smokeless tobacco. She reports that she drinks alcohol. She reports that she does not use illicit drugs.  Family History  Problem Relation Age of Onset  . Diabetes Mother   . Hypertension Mother   . Transient ischemic attack Mother   . Heart attack Father 65  . Arthritis Father   . Breast cancer Maternal Aunt   . Arthritis Maternal Aunt   . Arthritis Mother   . Hypertension Father     Medications:   Medication List       This list is accurate as of: 07/01/16  3:23 PM.  Always use your most recent med list.               apixaban 2.5 MG Tabs tablet  Commonly known as:  ELIQUIS  Take 1 tablet (2.5 mg total) by mouth 2 (two) times daily.     carvedilol 12.5 MG tablet  Commonly known as:  COREG  Take 1 tablet (12.5 mg total) by mouth 2 (two) times daily with a meal.     CENTRUM SILVER ULTRA WOMENS PO  Take by mouth.     furosemide 40 MG tablet  Commonly known as:  LASIX  Take 2 tablets (80 mg total) by mouth daily.     glucose blood test strip  Commonly known as:  ONE TOUCH TEST STRIPS  Use to check blood sugar 2 times per day. Dx Code E11.9 (ONE  TOUCH VERIO)     insulin NPH Human 100 UNIT/ML injection  Commonly known as:  HUMULIN N,NOVOLIN N  Inject 40 Units into the skin daily.     methimazole 5 MG tablet  Commonly known as:  TAPAZOLE  Take 5 mg by mouth 2 (two) times a week.     nitroGLYCERIN 0.4 MG SL tablet  Commonly known as:  NITROSTAT  Place 0.4 mg under the  tongue every 5 (five) minutes as needed for chest pain. Reported on 01/28/2016     onetouch ultrasoft lancets  Use to check blood sugar 2 times per day. Dx Code E11.9     oxyCODONE-acetaminophen 5-325 MG tablet  Commonly known as:  ROXICET  Take 1 tablet by mouth every 4 (four) hours as needed.     potassium chloride SA 20 MEQ tablet  Commonly known as:  K-DUR,KLOR-CON  Take 20-40 mEq by mouth daily. Take 1 tablet (54mEq) daily alternating with 2 tablets (28mEq) daily)     tiotropium 18 MCG inhalation capsule  Commonly known as:  SPIRIVA  Place 1 capsule (18 mcg total) into inhaler and inhale daily.     tizanidine 2 MG capsule  Commonly known as:  ZANAFLEX  Take 1 capsule (2 mg total) by mouth 3 (three) times daily.     Vitamin D (Cholecalciferol) 1000 units Tabs  Take 2,000 Units by mouth daily.        Immunizations: Immunization History  Administered Date(s) Administered  . Influenza Split 11/16/2011, 09/26/2012  . Influenza Whole 12/27/2004, 10/21/2008, 10/20/2009, 11/11/2010  . Influenza,inj,Quad PF,36+ Mos 09/24/2013, 12/16/2014, 09/19/2015  . PPD Test 05/31/2015  . Pneumococcal Polysaccharide-23 12/16/2014  . Pneumococcal-Unspecified 05/31/2015  . Td 06/15/2010     Physical Exam:  Filed Vitals:   07/01/16 1058  BP: 160/64  Pulse: 102  Temp: 98 F (36.7 C)  TempSrc: Oral  Resp: 18  Height: 5\' 9"  (1.753 m)  Weight: 181 lb (82.101 kg)  SpO2: 98%   Body mass index is 26.72 kg/(m^2).  General- elderly female, well built, in no acute distress Head- normocephalic, atraumatic Nose- no maxillary or frontal sinus tenderness, no nasal  discharge Throat- moist mucus membrane  Eyes- PERRLA, EOMI, no pallor, no icterus, no discharge, normal conjunctiva, normal sclera Neck- no cervical lymphadenopathy Cardiovascular- normal s1,s2, no murmur, trace leg edema Respiratory- bilateral clear to auscultation, no wheeze, no rhonchi, no crackles, no use of accessory muscles Abdomen- bowel sounds present, soft, non tender Musculoskeletal- able to move all 4 extremities, limited right knee range of motion Neurological- alert and oriented to person Skin- warm and dry, right knee surgical inicision with aquacel dressing Psychiatry- normal mood and affect    Labs reviewed: Basic Metabolic Panel:  Recent Labs  06/09/16 1645 06/18/16 1154 06/29/16 0341  NA 137 136 136  K 4.1 4.4 5.1  CL 97 101 106  CO2 31 27 21*  GLUCOSE 196* 301* 138*  BUN 22 15 15   CREATININE 1.02 1.14* 0.83  CALCIUM 10.1 9.9 9.2   Liver Function Tests:  Recent Labs  08/19/15 1440 03/04/16 0833 06/09/16 1645  AST 31 20 22   ALT 21 15 17   ALKPHOS 93 96 106  BILITOT 0.7 0.4 0.5  PROT 8.0 7.9 8.1  ALBUMIN 3.7 4.1 4.2   No results for input(s): LIPASE, AMYLASE in the last 8760 hours. No results for input(s): AMMONIA in the last 8760 hours. CBC:  Recent Labs  03/04/16 0833 06/09/16 1645 06/18/16 1154 06/29/16 0341 06/30/16 0315  WBC 6.1 5.8 4.6 7.5 12.1*  NEUTROABS 3.2 3.2 2.5  --   --   HGB 14.7 15.2* 14.3 13.0 12.5  HCT 44.6 46.2* 45.1 41.7 40.3  MCV 90.9 88.5 92.4 95.4 93.7  PLT 201.0 222.0 202 152 154   Cardiac Enzymes: No results for input(s): CKTOTAL, CKMB, CKMBINDEX, TROPONINI in the last 8760 hours. BNP: Invalid input(s): POCBNP CBG:  Recent Labs  06/29/16 2155 06/30/16  0719 06/30/16 1142  GLUCAP 191* 189* 214*    Radiological Exams: Dg Hip Unilat With Pelvis 2-3 Views Left  06/04/2016  CLINICAL DATA:  Left hip EXAM: DG HIP (WITH OR WITHOUT PELVIS) 2-3V LEFT COMPARISON:  None. FINDINGS: The pelvic bones are intact.  There is mild-to-moderate sacroiliitis there is mild to moderate pubic symphysitis. There is moderate bilateral hip joint space narrowing with mild osteophyte formation. This is symmetric bilaterally. Both femoral heads maintain a normal rounded configuration. IMPRESSION: Moderate bilateral hip arthritis. Electronically Signed   By: Skipper Cliche M.D.   On: 06/04/2016 17:55    Assessment/Plan  Unsteady gait Post right TKA. Will have patient work with PT/OT as tolerated to regain strength and restore function.  Fall precautions are in place.  Right knee OA S/p right total knee arthroplasty. Has orthopedic follow up. Will make changes to her pain regimen as below due to adverse effect. WBAT to RLE. Will have her work with physical therapy and occupational therapy team to help with gait training and muscle strengthening exercises.fall precautions. Skin care. Encourage to be out of bed. Continue vitamin d supplement. Continue eliquis for dvt prophylaxis. Continue zanaflex 2 mg tid but change to prn for muscle spasm. Add ted hose  Hallucination Likely from her narcotics. D/c percocet and list in her meds with adverse effect. Start tylenol 500 mg 2 tab bid with tramadol 50 mg 1-2 tab q6h prn pain.   Right leg edema Continue lasix for now with kcl, add ted hose  Constipation Add prune with breakfast. Add senna s 2 tab qhs and miralax daily prn. Hydration encouraged.   Hyperthyroidism Continue methimazole   Hypokalemia Continue kcl  Leukocytosis Afebrile but has some confusion. Check cbc with diff, cmp in am  afib Rate controlled. Continue coreg 12.5 mg bid with eliquis for anticoagulation. Monitor bp and HR  CAD Chest pain free. Continue coreg and prn NTG  DM Lab Results  Component Value Date   HGBA1C 8.4* 06/09/2016   Monitor cbg. Continue novolin 40 u daily and check cbg bid  OSA CPAP at bedtime  Chronic diastolic chf Monitor weight 3 days a week. Continue lasix and coreg  with kcl, check bmp    Goals of care: short term rehabilitation   Labs/tests ordered: cbc, cmp  Family/ staff Communication: reviewed care plan with patient and nursing supervisor    Blanchie Serve, MD Internal Medicine [address] [place], [place] [address] Cell Phone (Monday-Friday 8 am - 5 pm): [phone] On Call: [phone] and follow prompts after 5 pm and on weekends Office Phone: [phone] Office Fax: [phone]

## 2016-07-02 ENCOUNTER — Telehealth: Payer: Self-pay

## 2016-07-02 LAB — BASIC METABOLIC PANEL
BUN: 25 mg/dL — AB (ref 4–21)
BUN: 25 mg/dL — AB (ref 4–21)
CREATININE: 1 mg/dL (ref 0.5–1.1)
CREATININE: 1 mg/dL (ref 0.5–1.1)
GLUCOSE: 192 mg/dL
GLUCOSE: 192 mg/dL
Potassium: 3.8 mmol/L (ref 3.4–5.3)
Potassium: 3.8 mmol/L (ref 3.4–5.3)
SODIUM: 138 mmol/L (ref 137–147)
Sodium: 138 mmol/L (ref 137–147)

## 2016-07-02 LAB — HEPATIC FUNCTION PANEL
ALT: 16 U/L (ref 7–35)
ALT: 16 U/L (ref 7–35)
AST: 25 U/L (ref 13–35)
AST: 25 U/L (ref 13–35)
Alkaline Phosphatase: 72 U/L (ref 25–125)
Alkaline Phosphatase: 72 U/L (ref 25–125)
BILIRUBIN, TOTAL: 0.6 mg/dL
Bilirubin, Total: 0.6 mg/dL

## 2016-07-02 NOTE — Telephone Encounter (Signed)
Pt on TCM list and has been dc to nursing home.

## 2016-07-05 ENCOUNTER — Encounter: Payer: Self-pay | Admitting: Adult Health

## 2016-07-05 ENCOUNTER — Non-Acute Institutional Stay (SKILLED_NURSING_FACILITY): Payer: Medicare Other | Admitting: Adult Health

## 2016-07-05 DIAGNOSIS — E43 Unspecified severe protein-calorie malnutrition: Secondary | ICD-10-CM | POA: Diagnosis not present

## 2016-07-05 NOTE — Progress Notes (Signed)
Patient ID: [name], female   DOB: 1931/04/20, 82 y.o.   MRN: [id number]    DATE:   07/05/16  MRN:  [id number]  BIRTHDAY: 1931-10-10  Facility:  N[facility]ing Home Location:  [place] [facility] [place] Room Number: [id number]  LEVEL OF CARE:  [phone])  Contact Information    Name Relation Home Work [place] Son [phone]  [phone]   Lue, Dunckel Daughter [phone]  [phone]       Code Status History    Date Active Date Inactive Code Status Order ID Comments User Context   06/28/2016  1:23 PM 06/30/2016  6:21 PM Full Code [id number]  Leighton Parody, PA-C Inpatient   05/27/2015  8:11 AM 05/31/2015  6:21 PM Full Code [id number]  Elmarie Shiley, MD Inpatient   05/25/2015  4:12 PM 05/27/2015  8:11 AM DNR [id number]  Elmarie Shiley, MD Inpatient   05/24/2015  6:50 PM 05/25/2015  4:12 PM Full Code [id number]  Samella Parr, [name] Inpatient   05/07/2014 12:02 PM 05/07/2014  6:40 PM Full Code [id number]  Peter M Martinique, MD Inpatient    Advance Directive Documentation        Most Recent Value   Type of Advance Directive  Out of facility DNR (pink MOST or yellow form)   Pre-existing out of facility DNR order (yellow form or pink MOST form)     "MOST" Form in Place?         Chief Complaint  Patient presents with  . Acute Visit    Protein calorie malnutrition    HISTORY OF PRESENT ILLNESS:   This is an 82 year old female who has been noted to have albumin 2.72, low.  She has been admitted to [facility] on 06/30/16 from [facility] with Osteoarthritis for which she had right total knee arthroplasty on 06/28/16. She is currently having short-term rehabilitation.  PAST MEDICAL HISTORY:  Past Medical History  Diagnosis Date  . Hypopotassemia     PMH of  . Anemia     iron defficiency  . Muscle pain   . Atrial fibrillation or flutter     s/p RFCA 7/08;   s/p DCCV in past;   previously on amiodarone;  amio stopped due to lung toxicity   . CAD (coronary artery disease)     s/p NSTEMI tx with BMS to OM1 3/08;  cath 3/08: pOM 99% tx with PCI, pLAD 20%, ? mod stenosis at the AM  . HLD (hyperlipidemia)   . HTN (hypertension)     essential nos  . Dystrophy, corneal stromal   . Chronic diastolic heart failure ([place])     echo 11/11:  EF 55-60%, severe LVH, mod LAE, mild MR, mildly increased PASP  . Osteoporosis   . Heart murmur   . Anginal pain ([place])   . Myocardial infarction ([place]) 2008  . COPD (chronic obstructive pulmonary disease) ([place])   . Asthma   . Pneumonia 05/27/2015  . OSA on CPAP   . Type II diabetes mellitus ([facility])     Dr Loanne Drilling  . Seasonal allergies   . Degenerative joint disease   . Arthritis     "knees, feet, hands; joints" (05/27/2015)  . Shortness of breath dyspnea   . Rash and nonspecific skin eruption     both arms,awaiting bio   dr Delman Cheadle     CURRENT MEDICATIONS: Reviewed  Patient's Medications  New Prescriptions   No medications on file  Previous Medications   ACETAMINOPHEN (TYLENOL) 500 MG TABLET    Take 1,000 mg by mouth 2 (two) times daily.   APIXABAN (ELIQUIS) 2.5 MG TABS TABLET    Take 1 tablet (2.5 mg total) by mouth 2 (two) times daily.   CARVEDILOL (COREG) 12.5 MG TABLET    Take 1 tablet (12.5 mg total) by mouth 2 (two) times daily with a meal.   FUROSEMIDE (LASIX) 40 MG TABLET    Take 2 tablets (80 mg total) by mouth daily.   GLUCOSE BLOOD (ONE TOUCH TEST STRIPS) TEST STRIP    Use to check blood sugar 2 times per day. Dx Code E11.9 (ONE TOUCH VERIO)   INSULIN NPH HUMAN (HUMULIN N,NOVOLIN N) 100 UNIT/ML INJECTION    Inject 40 Units into the skin daily.    LANCETS (ONETOUCH ULTRASOFT) LANCETS    Use to check blood sugar 2 times per day. Dx Code E11.9   METHIMAZOLE (TAPAZOLE) 5 MG TABLET    Take 5 mg by mouth 2 (two) times a week.   MULTIPLE VITAMINS-MINERALS (CENTRUM SILVER ULTRA WOMENS PO)    Take by mouth.   NITROGLYCERIN (NITROSTAT) 0.4 MG SL TABLET    Place 0.4 mg under the tongue every  5 (five) minutes as needed for chest pain. Reported on 01/28/2016   POLYETHYLENE GLYCOL (MIRALAX / GLYCOLAX) PACKET    Take 17 g by mouth daily as needed for mild constipation.   POTASSIUM CHLORIDE SA (K-DUR,KLOR-CON) 20 MEQ TABLET    Take 20-40 mEq by mouth daily. Take 1 tablet (27mEq) daily alternating with 2 tablets (70mEq) daily)   PROTEIN (PROCEL) POWD    Take 2 scoop by mouth 2 (two) times daily.   SENNOSIDES-DOCUSATE SODIUM (SENOKOT-S) 8.6-50 MG TABLET    Take 2 tablets by mouth at bedtime.   TIOTROPIUM (SPIRIVA) 18 MCG INHALATION CAPSULE    Place 1 capsule (18 mcg total) into inhaler and inhale daily.   TIZANIDINE (ZANAFLEX) 2 MG CAPSULE    Take 1 capsule (2 mg total) by mouth 3 (three) times daily.   VITAMIN D, CHOLECALCIFEROL, 1000 UNITS TABS    Take 2,000 Units by mouth daily.   Modified Medications   No medications on file  Discontinued Medications   OXYCODONE-ACETAMINOPHEN (ROXICET) 5-325 MG TABLET    Take 1 tablet by mouth every 4 (four) hours as needed.     Allergies  Allergen Reactions  . Amlodipine Besylate Other (See Comments)    REACTION: tingling in lips & gum edema  . Levaquin [Levofloxacin] Shortness Of Breath and Swelling    angioedema  . Lobster [Shellfish Allergy] Other (See Comments)    angioedema  . Penicillins Rash  . Valsartan Other (See Comments)    REACTION: angioedema  . Codeine Other (See Comments)    Mental status changes  . Oxycodone   . Tramadol Hcl Nausea And Vomiting     REVIEW OF SYSTEMS:  GENERAL: no change in appetite, no fatigue, no weight changes, no fever, chills or weakness EYES: Denies change in vision, dry eyes, eye pain, itching or discharge EARS: Denies change in hearing, ringing in ears, or earache NOSE: Denies nasal congestion or epistaxis MOUTH and THROAT: Denies oral discomfort, gingival pain or bleeding, pain from teeth or hoarseness   RESPIRATORY: no cough, SOB, DOE, wheezing, hemoptysis CARDIAC: no chest pain, edema or  palpitations GI: no abdominal pain, diarrhea, constipation, heart burn, nausea or vomiting GU: Denies dysuria, frequency, hematuria, incontinence, or discharge PSYCHIATRIC: Denies feeling of depression  or anxiety. No report of hallucinations, insomnia, paranoia, or agitation    PHYSICAL EXAMINATION  GENERAL APPEARANCE: Well nourished. In no acute distress. Normal body habitus SKIN:  Right knee surgical incision is covered with aquacel dressing, dry, no erythema HEAD: Normal in size and contour. No evidence of trauma EYES: Lids open and close normally. No blepharitis, entropion or ectropion. PERRL. Conjunctivae are clear and sclerae are white. Lenses are without opacity EARS: Pinnae are normal. Patient hears normal voice tunes of the examiner MOUTH and THROAT: Lips are without lesions. Oral mucosa is moist and without lesions. Tongue is normal in shape, size, and color and without lesions NECK: supple, trachea midline, no neck masses, no thyroid tenderness, no thyromegaly LYMPHATICS: no LAN in the neck, no supraclavicular LAN RESPIRATORY: breathing is even & unlabored, BS CTAB CARDIAC: RRR, no murmur,no extra heart sounds, no edema GI: abdomen soft, normal BS, no masses, no tenderness, no hepatomegaly, no splenomegaly EXTREMITIES:  Able to move 4 extremities PSYCHIATRIC: Alert and oriented X 3. Affect and behavior are appropriate  LABS/RADIOLOGY: Labs reviewed: Basic Metabolic Panel:  Recent Labs  06/09/16 1645 06/18/16 1154 06/29/16 0341 07/02/16  NA 137 136 136 138  138  K 4.1 4.4 5.1 3.8  3.8  CL 97 101 106  --   CO2 31 27 21*  --   GLUCOSE 196* 301* 138*  --   BUN 22 15 15  25*  25*  CREATININE 1.02 1.14* 0.83 1.0  1.0  CALCIUM 10.1 9.9 9.2  --    Liver Function Tests:  Recent Labs  08/19/15 1440 03/04/16 0833 06/09/16 1645 07/02/16  AST 31 20 22 25  25   ALT 21 15 17 16  16   ALKPHOS 93 96 106 72  72  BILITOT 0.7 0.4 0.5  --   PROT 8.0 7.9 8.1  --    ALBUMIN 3.7 4.1 4.2  --    CBC:  Recent Labs  03/04/16 0833 06/09/16 1645 06/18/16 1154 06/29/16 0341 06/30/16 0315  WBC 6.1 5.8 4.6 7.5 12.1*  NEUTROABS 3.2 3.2 2.5  --   --   HGB 14.7 15.2* 14.3 13.0 12.5  HCT 44.6 46.2* 45.1 41.7 40.3  MCV 90.9 88.5 92.4 95.4 93.7  PLT 201.0 222.0 202 152 154    Lipid Panel:  Recent Labs  03/04/16 0833  HDL 51.70   CBG:  Recent Labs  06/29/16 2155 06/30/16 0719 06/30/16 1142  GLUCAP 191* 189* 214*       ASSESSMENT/PLAN:  Protein-calorie malnutrition, severe - albumin 2.72; start [place] 2 scoops PO BID; RD consult    [name] Age, [name] [place]

## 2016-07-07 ENCOUNTER — Ambulatory Visit: Payer: Medicare Other | Admitting: Endocrinology

## 2016-07-13 DIAGNOSIS — M1711 Unilateral primary osteoarthritis, right knee: Secondary | ICD-10-CM | POA: Diagnosis not present

## 2016-07-16 ENCOUNTER — Non-Acute Institutional Stay (SKILLED_NURSING_FACILITY): Payer: Medicare Other | Admitting: Adult Health

## 2016-07-16 ENCOUNTER — Encounter: Payer: Self-pay | Admitting: Adult Health

## 2016-07-16 DIAGNOSIS — M1711 Unilateral primary osteoarthritis, right knee: Secondary | ICD-10-CM | POA: Diagnosis not present

## 2016-07-16 DIAGNOSIS — Z794 Long term (current) use of insulin: Secondary | ICD-10-CM

## 2016-07-16 DIAGNOSIS — E876 Hypokalemia: Secondary | ICD-10-CM

## 2016-07-16 DIAGNOSIS — I5032 Chronic diastolic (congestive) heart failure: Secondary | ICD-10-CM | POA: Diagnosis not present

## 2016-07-16 DIAGNOSIS — R2681 Unsteadiness on feet: Secondary | ICD-10-CM

## 2016-07-16 DIAGNOSIS — E0590 Thyrotoxicosis, unspecified without thyrotoxic crisis or storm: Secondary | ICD-10-CM | POA: Diagnosis not present

## 2016-07-16 DIAGNOSIS — G4733 Obstructive sleep apnea (adult) (pediatric): Secondary | ICD-10-CM

## 2016-07-16 DIAGNOSIS — N183 Chronic kidney disease, stage 3 (moderate): Secondary | ICD-10-CM

## 2016-07-16 DIAGNOSIS — I2510 Atherosclerotic heart disease of native coronary artery without angina pectoris: Secondary | ICD-10-CM | POA: Diagnosis not present

## 2016-07-16 DIAGNOSIS — E43 Unspecified severe protein-calorie malnutrition: Secondary | ICD-10-CM

## 2016-07-16 DIAGNOSIS — I482 Chronic atrial fibrillation, unspecified: Secondary | ICD-10-CM

## 2016-07-16 DIAGNOSIS — J449 Chronic obstructive pulmonary disease, unspecified: Secondary | ICD-10-CM | POA: Diagnosis not present

## 2016-07-16 DIAGNOSIS — Z9989 Dependence on other enabling machines and devices: Secondary | ICD-10-CM

## 2016-07-16 DIAGNOSIS — K5900 Constipation, unspecified: Secondary | ICD-10-CM | POA: Diagnosis not present

## 2016-07-16 DIAGNOSIS — E1122 Type 2 diabetes mellitus with diabetic chronic kidney disease: Secondary | ICD-10-CM

## 2016-07-16 NOTE — Progress Notes (Signed)
Patient ID: [name], female   DOB: 1931-02-03, 82 y.o.   MRN: [id number]    DATE:   07/16/16  MRN:  [id number]  BIRTHDAY: 1931-07-30  Facility:  [facility]ursing Home Location:  [place] Room Number: [id number]  LEVEL OF CARE:  SNF [phone])  Contact Information    Name Relation Home Work [place] Son [phone]  [phone]   Chakira, Peroni Daughter [phone]  [phone]       Code Status History    Date Active Date Inactive Code Status Order ID Comments User Context   06/28/2016  1:23 PM 06/30/2016  6:21 PM Full Code [id number]  Leighton Parody, PA-C Inpatient   05/27/2015  8:11 AM 05/31/2015  6:21 PM Full Code [id number]  Elmarie Shiley, MD Inpatient   05/25/2015  4:12 PM 05/27/2015  8:11 AM DNR [id number]  Elmarie Shiley, MD Inpatient   05/24/2015  6:50 PM 05/25/2015  4:12 PM Full Code [id number]  Samella Parr, NP Inpatient   05/07/2014 12:02 PM 05/07/2014  6:40 PM Full Code [id number]  Peter M Martinique, MD Inpatient    Advance Directive Documentation        Most Recent Value   Type of Advance Directive  Out of facility DNR (pink MOST or yellow form)   Pre-existing out of facility DNR order (yellow form or pink MOST form)     "MOST" Form in Place?         Chief Complaint  Patient presents with  . Discharge Note    HISTORY OF PRESENT ILLNESS:   This is an 82 year old female who is for discharge home With home health PT for endurance and OT for ADLs. DME standard rolling walker and 3 in 1 bedside commode  She has been admitted to [facility] on 06/30/16 from [facility] with Osteoarthritis for which she had right total knee arthroplasty on 06/28/16.   Patient was admitted to this facility for short-term rehabilitation after the patient's recent hospitalization.  Patient has completed SNF rehabilitation and therapy has cleared the patient for discharge.   PAST MEDICAL HISTORY:  Past Medical History  Diagnosis Date  .  Hypopotassemia     PMH of  . Anemia     iron defficiency  . Muscle pain   . Atrial fibrillation or flutter     s/p RFCA 7/08;   s/p DCCV in past;   previously on amiodarone;  amio stopped due to lung toxicity  . CAD (coronary artery disease)     s/p NSTEMI tx with BMS to OM1 3/08;  cath 3/08: pOM 99% tx with PCI, pLAD 20%, ? mod stenosis at the AM  . HLD (hyperlipidemia)   . HTN (hypertension)     essential nos  . Dystrophy, corneal stromal   . Chronic diastolic heart failure ([place])     echo 11/11:  EF 55-60%, severe LVH, mod LAE, mild MR, mildly increased PASP  . Osteoporosis   . Heart murmur   . Anginal pain ([place])   . Myocardial infarction ([place]) 2008  . COPD (chronic obstructive pulmonary disease) ([place])   . Asthma   . Pneumonia 05/27/2015  . OSA on CPAP   . Type II diabetes mellitus (HCC)     Dr Loanne Drilling  . Seasonal allergies   . Degenerative joint disease   . Arthritis     "knees, feet, hands; joints" (05/27/2015)  . Shortness of breath dyspnea   . Rash  and nonspecific skin eruption     both arms,awaiting bio   dr Delman Cheadle  . Protein calorie malnutrition ([place])      CURRENT MEDICATIONS: Reviewed  Patient's Medications  New Prescriptions   No medications on file  Previous Medications   ACETAMINOPHEN (TYLENOL) 325 MG TABLET    Take 650 mg by mouth every 6 (six) hours as needed for moderate pain.   ACETAMINOPHEN (TYLENOL) 500 MG TABLET    Take 1,000 mg by mouth 2 (two) times daily.   APIXABAN (ELIQUIS) 2.5 MG TABS TABLET    Take 1 tablet (2.5 mg total) by mouth 2 (two) times daily.   CARVEDILOL (COREG) 12.5 MG TABLET    Take 1 tablet (12.5 mg total) by mouth 2 (two) times daily with a meal.   FUROSEMIDE (LASIX) 40 MG TABLET    Take 2 tablets (80 mg total) by mouth daily.   GLUCOSE BLOOD (ONE TOUCH TEST STRIPS) TEST STRIP    Use to check blood sugar 2 times per day. Dx Code E11.9 (ONE TOUCH VERIO)   INSULIN NPH HUMAN (HUMULIN N,NOVOLIN N) 100 UNIT/ML INJECTION    Inject 40 Units  into the skin daily.    LANCETS (ONETOUCH ULTRASOFT) LANCETS    Use to check blood sugar 2 times per day. Dx Code E11.9   METHIMAZOLE (TAPAZOLE) 5 MG TABLET    Take 5 mg by mouth 2 (two) times a week.   MULTIPLE VITAMINS-MINERALS (CENTRUM SILVER ULTRA WOMENS PO)    Take by mouth.   NITROGLYCERIN (NITROSTAT) 0.4 MG SL TABLET    Place 0.4 mg under the tongue every 5 (five) minutes as needed for chest pain. Reported on 01/28/2016   POLYETHYLENE GLYCOL (MIRALAX / GLYCOLAX) PACKET    Take 17 g by mouth daily as needed for mild constipation.   POTASSIUM CHLORIDE SA (K-DUR,KLOR-CON) 20 MEQ TABLET    Take 20-40 mEq by mouth daily. Take 1 tablet (1mEq) daily alternating with 2 tablets (61mEq) daily)   PROTEIN (PROCEL) POWD    Take 2 scoop by mouth 2 (two) times daily.   SENNOSIDES-DOCUSATE SODIUM (SENOKOT-S) 8.6-50 MG TABLET    Take 2 tablets by mouth at bedtime.   TIOTROPIUM (SPIRIVA) 18 MCG INHALATION CAPSULE    Place 1 capsule (18 mcg total) into inhaler and inhale daily.   TIZANIDINE (ZANAFLEX) 2 MG CAPSULE    Take 1 capsule (2 mg total) by mouth 3 (three) times daily.   VITAMIN D, CHOLECALCIFEROL, 1000 UNITS TABS    Take 2,000 Units by mouth daily.   Modified Medications   No medications on file  Discontinued Medications   No medications on file     Allergies  Allergen Reactions  . Amlodipine Besylate Other (See Comments)    REACTION: tingling in lips & gum edema  . Levaquin [Levofloxacin] Shortness Of Breath and Swelling    angioedema  . Lobster [Shellfish Allergy] Other (See Comments)    angioedema  . Penicillins Rash  . Valsartan Other (See Comments)    REACTION: angioedema  . Codeine Other (See Comments)    Mental status changes  . Oxycodone   . Tramadol Hcl Nausea And Vomiting     REVIEW OF SYSTEMS:  GENERAL: no change in appetite, no fatigue, no weight changes, no fever, chills or weakness EYES: Denies change in vision, dry eyes, eye pain, itching or discharge EARS: Denies  change in hearing, ringing in ears, or earache NOSE: Denies nasal congestion or epistaxis MOUTH and THROAT: Denies  oral discomfort, gingival pain or bleeding, pain from teeth or hoarseness   RESPIRATORY: no cough, SOB, DOE, wheezing, hemoptysis CARDIAC: no chest pain, edema or palpitations GI: no abdominal pain, diarrhea, constipation, heart burn, nausea or vomiting GU: Denies dysuria, frequency, hematuria, incontinence, or discharge PSYCHIATRIC: Denies feeling of depression or anxiety. No report of hallucinations, insomnia, paranoia, or agitation    PHYSICAL EXAMINATION  GENERAL APPEARANCE: Well nourished. In no acute distress. Normal body habitus SKIN:  Right knee surgical incision is dry, no erythema HEAD: Normal in size and contour. No evidence of trauma EYES: Lids open and close normally. No blepharitis, entropion or ectropion. PERRL. Conjunctivae are clear and sclerae are white. Lenses are without opacity EARS: Pinnae are normal. Patient hears normal voice tunes of the examiner MOUTH and THROAT: Lips are without lesions. Oral mucosa is moist and without lesions. Tongue is normal in shape, size, and color and without lesions NECK: supple, trachea midline, no neck masses, no thyroid tenderness, no thyromegaly LYMPHATICS: no LAN in the neck, no supraclavicular LAN RESPIRATORY: breathing is even & unlabored, BS CTAB CARDIAC: RRR, no murmur,no extra heart sounds, no edema GI: abdomen soft, normal BS, no masses, no tenderness, no hepatomegaly, no splenomegaly EXTREMITIES:  Able to move 4 extremities PSYCHIATRIC: Alert and oriented X 3. Affect and behavior are appropriate  LABS/RADIOLOGY: Labs reviewed: Basic Metabolic Panel:  Recent Labs  06/09/16 1645 06/18/16 1154 06/29/16 0341 07/02/16  NA 137 136 136 138  138  K 4.1 4.4 5.1 3.8  3.8  CL 97 101 106  --   CO2 31 27 21*  --   GLUCOSE 196* 301* 138*  --   BUN 22 15 15  25*  25*  CREATININE 1.02 1.14* 0.83 1.0  1.0   CALCIUM 10.1 9.9 9.2  --    Liver Function Tests:  Recent Labs  08/19/15 1440 03/04/16 0833 06/09/16 1645 07/02/16  AST 31 20 22 25  25   ALT 21 15 17 16  16   ALKPHOS 93 96 106 72  72  BILITOT 0.7 0.4 0.5  --   PROT 8.0 7.9 8.1  --   ALBUMIN 3.7 4.1 4.2  --    CBC:  Recent Labs  03/04/16 0833 06/09/16 1645 06/18/16 1154 06/29/16 0341 06/30/16 0315  WBC 6.1 5.8 4.6 7.5 12.1*  NEUTROABS 3.2 3.2 2.5  --   --   HGB 14.7 15.2* 14.3 13.0 12.5  HCT 44.6 46.2* 45.1 41.7 40.3  MCV 90.9 88.5 92.4 95.4 93.7  PLT 201.0 222.0 202 152 154    Lipid Panel:  Recent Labs  03/04/16 0833  HDL 51.70   CBG:  Recent Labs  06/29/16 2155 06/30/16 0719 06/30/16 1142  GLUCAP 191* 189* 214*       ASSESSMENT/PLAN:  Unsteady gait - for home health PT and OT; fall precaution  Right knee osteoarthritis S/P right total knee arthroplasty - follow-up with orthopedics; for home health PT and OT; continue Eliquis 2.5 mg 1 tab by mouth twice a day for DVT prophylaxis; Zanaflex 2 mg by mouth 3 times a day when necessary for muscle spasm; continue Tylenol extra strength 500 mg take 2 tabs = 1,000 mg by mouth twice a day and Tylenol 325 mg take 2 tabs  = 650 mg by mouth every 8 hours PRN for pain  Protein-calorie malnutrition, severe - albumin 2.72; continuet Procel 2 scoops PO BID   Constipation - continue MiraLAX 17 g by mouth daily when necessary, senna S2 tabs  by mouth daily at bedtime  Atrial fibrillation - rate controlled; continue Eliquis 2.5 mg 1 tab by mouth twice a day and Coreg 12.5 mg 1 tab by mouth twice a day  Diabetes Mellitus, type 2 - continue Humulin N 40 units subcutaneous daily  COPD - no SOB; continue Spiriva 18 g capsule into inhaler and inhale daily  Chronic diastolic CHF - continue Lasix 40 mg take 2 tabs = 80 mg by mouth daily  Hyperthyroidism - continue Tapazole 5 mg 1 tab by mouth every Monday and Friday  Hypokalemia - continue KCL ER 10 meq 2 tabs = 20  meq by mouth every other day and KCL 20 meq take 2 tabs= 40 m3q by mouth every other day Lab Results  Component Value Date   K 3.8 07/02/2016   CAD - stable; continue NTG when necessary and Coreg 12.5 mg 1 tab by mouth twice a day  OSA - continue CPAP at night     I have filled out patient's discharge paperwork and written prescriptions.  Patient will receive home health PT and OT.  DME provided:  Standard rolling walker and 3 in 1 bedside commode  Total discharge time: Greater than 30 minutes  Discharge time involved coordination of the discharge process with social worker, nursing staff and therapy department. Medical justification for home health services/DME verified.    Durenda Age, NP [employer] [phone]

## 2016-07-18 DIAGNOSIS — I2510 Atherosclerotic heart disease of native coronary artery without angina pectoris: Secondary | ICD-10-CM | POA: Diagnosis not present

## 2016-07-18 DIAGNOSIS — Z471 Aftercare following joint replacement surgery: Secondary | ICD-10-CM | POA: Diagnosis not present

## 2016-07-18 DIAGNOSIS — I110 Hypertensive heart disease with heart failure: Secondary | ICD-10-CM | POA: Diagnosis not present

## 2016-07-18 DIAGNOSIS — E119 Type 2 diabetes mellitus without complications: Secondary | ICD-10-CM | POA: Diagnosis not present

## 2016-07-18 DIAGNOSIS — E785 Hyperlipidemia, unspecified: Secondary | ICD-10-CM | POA: Diagnosis not present

## 2016-07-18 DIAGNOSIS — E43 Unspecified severe protein-calorie malnutrition: Secondary | ICD-10-CM | POA: Diagnosis not present

## 2016-07-18 DIAGNOSIS — J449 Chronic obstructive pulmonary disease, unspecified: Secondary | ICD-10-CM | POA: Diagnosis not present

## 2016-07-18 DIAGNOSIS — Z7901 Long term (current) use of anticoagulants: Secondary | ICD-10-CM | POA: Diagnosis not present

## 2016-07-18 DIAGNOSIS — Z96651 Presence of right artificial knee joint: Secondary | ICD-10-CM | POA: Diagnosis not present

## 2016-07-18 DIAGNOSIS — I5032 Chronic diastolic (congestive) heart failure: Secondary | ICD-10-CM | POA: Diagnosis not present

## 2016-07-18 DIAGNOSIS — Z794 Long term (current) use of insulin: Secondary | ICD-10-CM | POA: Diagnosis not present

## 2016-07-18 DIAGNOSIS — E876 Hypokalemia: Secondary | ICD-10-CM | POA: Diagnosis not present

## 2016-07-18 DIAGNOSIS — Z955 Presence of coronary angioplasty implant and graft: Secondary | ICD-10-CM | POA: Diagnosis not present

## 2016-07-19 DIAGNOSIS — E43 Unspecified severe protein-calorie malnutrition: Secondary | ICD-10-CM | POA: Diagnosis not present

## 2016-07-19 DIAGNOSIS — I2510 Atherosclerotic heart disease of native coronary artery without angina pectoris: Secondary | ICD-10-CM | POA: Diagnosis not present

## 2016-07-19 DIAGNOSIS — J449 Chronic obstructive pulmonary disease, unspecified: Secondary | ICD-10-CM | POA: Diagnosis not present

## 2016-07-19 DIAGNOSIS — Z471 Aftercare following joint replacement surgery: Secondary | ICD-10-CM | POA: Diagnosis not present

## 2016-07-19 DIAGNOSIS — Z955 Presence of coronary angioplasty implant and graft: Secondary | ICD-10-CM | POA: Diagnosis not present

## 2016-07-19 DIAGNOSIS — E119 Type 2 diabetes mellitus without complications: Secondary | ICD-10-CM | POA: Diagnosis not present

## 2016-07-19 DIAGNOSIS — Z96651 Presence of right artificial knee joint: Secondary | ICD-10-CM | POA: Diagnosis not present

## 2016-07-19 DIAGNOSIS — E785 Hyperlipidemia, unspecified: Secondary | ICD-10-CM | POA: Diagnosis not present

## 2016-07-19 DIAGNOSIS — E876 Hypokalemia: Secondary | ICD-10-CM | POA: Diagnosis not present

## 2016-07-19 DIAGNOSIS — I110 Hypertensive heart disease with heart failure: Secondary | ICD-10-CM | POA: Diagnosis not present

## 2016-07-19 DIAGNOSIS — Z7901 Long term (current) use of anticoagulants: Secondary | ICD-10-CM | POA: Diagnosis not present

## 2016-07-19 DIAGNOSIS — Z794 Long term (current) use of insulin: Secondary | ICD-10-CM | POA: Diagnosis not present

## 2016-07-19 DIAGNOSIS — I5032 Chronic diastolic (congestive) heart failure: Secondary | ICD-10-CM | POA: Diagnosis not present

## 2016-07-20 DIAGNOSIS — Z96651 Presence of right artificial knee joint: Secondary | ICD-10-CM | POA: Diagnosis not present

## 2016-07-20 DIAGNOSIS — Z794 Long term (current) use of insulin: Secondary | ICD-10-CM | POA: Diagnosis not present

## 2016-07-20 DIAGNOSIS — Z471 Aftercare following joint replacement surgery: Secondary | ICD-10-CM | POA: Diagnosis not present

## 2016-07-20 DIAGNOSIS — I2510 Atherosclerotic heart disease of native coronary artery without angina pectoris: Secondary | ICD-10-CM | POA: Diagnosis not present

## 2016-07-20 DIAGNOSIS — Z955 Presence of coronary angioplasty implant and graft: Secondary | ICD-10-CM | POA: Diagnosis not present

## 2016-07-20 DIAGNOSIS — E785 Hyperlipidemia, unspecified: Secondary | ICD-10-CM | POA: Diagnosis not present

## 2016-07-20 DIAGNOSIS — E119 Type 2 diabetes mellitus without complications: Secondary | ICD-10-CM | POA: Diagnosis not present

## 2016-07-20 DIAGNOSIS — I5032 Chronic diastolic (congestive) heart failure: Secondary | ICD-10-CM | POA: Diagnosis not present

## 2016-07-20 DIAGNOSIS — E876 Hypokalemia: Secondary | ICD-10-CM | POA: Diagnosis not present

## 2016-07-20 DIAGNOSIS — E43 Unspecified severe protein-calorie malnutrition: Secondary | ICD-10-CM | POA: Diagnosis not present

## 2016-07-20 DIAGNOSIS — Z7901 Long term (current) use of anticoagulants: Secondary | ICD-10-CM | POA: Diagnosis not present

## 2016-07-20 DIAGNOSIS — I110 Hypertensive heart disease with heart failure: Secondary | ICD-10-CM | POA: Diagnosis not present

## 2016-07-20 DIAGNOSIS — J449 Chronic obstructive pulmonary disease, unspecified: Secondary | ICD-10-CM | POA: Diagnosis not present

## 2016-07-22 DIAGNOSIS — Z96651 Presence of right artificial knee joint: Secondary | ICD-10-CM | POA: Diagnosis not present

## 2016-07-22 DIAGNOSIS — Z794 Long term (current) use of insulin: Secondary | ICD-10-CM | POA: Diagnosis not present

## 2016-07-22 DIAGNOSIS — Z955 Presence of coronary angioplasty implant and graft: Secondary | ICD-10-CM | POA: Diagnosis not present

## 2016-07-22 DIAGNOSIS — E876 Hypokalemia: Secondary | ICD-10-CM | POA: Diagnosis not present

## 2016-07-22 DIAGNOSIS — Z7901 Long term (current) use of anticoagulants: Secondary | ICD-10-CM | POA: Diagnosis not present

## 2016-07-22 DIAGNOSIS — I5032 Chronic diastolic (congestive) heart failure: Secondary | ICD-10-CM | POA: Diagnosis not present

## 2016-07-22 DIAGNOSIS — E119 Type 2 diabetes mellitus without complications: Secondary | ICD-10-CM | POA: Diagnosis not present

## 2016-07-22 DIAGNOSIS — E785 Hyperlipidemia, unspecified: Secondary | ICD-10-CM | POA: Diagnosis not present

## 2016-07-22 DIAGNOSIS — I2510 Atherosclerotic heart disease of native coronary artery without angina pectoris: Secondary | ICD-10-CM | POA: Diagnosis not present

## 2016-07-22 DIAGNOSIS — J449 Chronic obstructive pulmonary disease, unspecified: Secondary | ICD-10-CM | POA: Diagnosis not present

## 2016-07-22 DIAGNOSIS — I110 Hypertensive heart disease with heart failure: Secondary | ICD-10-CM | POA: Diagnosis not present

## 2016-07-22 DIAGNOSIS — Z471 Aftercare following joint replacement surgery: Secondary | ICD-10-CM | POA: Diagnosis not present

## 2016-07-22 DIAGNOSIS — E43 Unspecified severe protein-calorie malnutrition: Secondary | ICD-10-CM | POA: Diagnosis not present

## 2016-07-27 ENCOUNTER — Ambulatory Visit (INDEPENDENT_AMBULATORY_CARE_PROVIDER_SITE_OTHER): Payer: Medicare Other | Admitting: Internal Medicine

## 2016-07-27 ENCOUNTER — Encounter: Payer: Self-pay | Admitting: Internal Medicine

## 2016-07-27 VITALS — BP 146/78 | HR 94 | Temp 97.8°F | Resp 16 | Wt 174.0 lb

## 2016-07-27 DIAGNOSIS — I10 Essential (primary) hypertension: Secondary | ICD-10-CM | POA: Diagnosis not present

## 2016-07-27 DIAGNOSIS — J449 Chronic obstructive pulmonary disease, unspecified: Secondary | ICD-10-CM | POA: Diagnosis not present

## 2016-07-27 DIAGNOSIS — Z794 Long term (current) use of insulin: Secondary | ICD-10-CM | POA: Diagnosis not present

## 2016-07-27 DIAGNOSIS — M1711 Unilateral primary osteoarthritis, right knee: Secondary | ICD-10-CM | POA: Diagnosis not present

## 2016-07-27 DIAGNOSIS — E119 Type 2 diabetes mellitus without complications: Secondary | ICD-10-CM | POA: Diagnosis not present

## 2016-07-27 DIAGNOSIS — I110 Hypertensive heart disease with heart failure: Secondary | ICD-10-CM | POA: Diagnosis not present

## 2016-07-27 DIAGNOSIS — E785 Hyperlipidemia, unspecified: Secondary | ICD-10-CM | POA: Diagnosis not present

## 2016-07-27 DIAGNOSIS — Z471 Aftercare following joint replacement surgery: Secondary | ICD-10-CM | POA: Diagnosis not present

## 2016-07-27 DIAGNOSIS — E43 Unspecified severe protein-calorie malnutrition: Secondary | ICD-10-CM | POA: Diagnosis not present

## 2016-07-27 DIAGNOSIS — E1122 Type 2 diabetes mellitus with diabetic chronic kidney disease: Secondary | ICD-10-CM | POA: Diagnosis not present

## 2016-07-27 DIAGNOSIS — N183 Chronic kidney disease, stage 3 unspecified: Secondary | ICD-10-CM

## 2016-07-27 DIAGNOSIS — I5032 Chronic diastolic (congestive) heart failure: Secondary | ICD-10-CM | POA: Diagnosis not present

## 2016-07-27 DIAGNOSIS — R200 Anesthesia of skin: Secondary | ICD-10-CM | POA: Diagnosis not present

## 2016-07-27 DIAGNOSIS — I2510 Atherosclerotic heart disease of native coronary artery without angina pectoris: Secondary | ICD-10-CM | POA: Diagnosis not present

## 2016-07-27 DIAGNOSIS — L920 Granuloma annulare: Secondary | ICD-10-CM | POA: Insufficient documentation

## 2016-07-27 DIAGNOSIS — Z96651 Presence of right artificial knee joint: Secondary | ICD-10-CM | POA: Diagnosis not present

## 2016-07-27 DIAGNOSIS — Z955 Presence of coronary angioplasty implant and graft: Secondary | ICD-10-CM | POA: Diagnosis not present

## 2016-07-27 DIAGNOSIS — E876 Hypokalemia: Secondary | ICD-10-CM | POA: Diagnosis not present

## 2016-07-27 DIAGNOSIS — Z7901 Long term (current) use of anticoagulants: Secondary | ICD-10-CM | POA: Diagnosis not present

## 2016-07-27 NOTE — Assessment & Plan Note (Signed)
Lab Results  Component Value Date   HGBA1C 8.4 (H) 06/09/2016   Sugars well controlled at home - were elevated at rehab and prior to that Will hold off on rechecking a1c until next visit

## 2016-07-27 NOTE — Progress Notes (Signed)
Subjective:    Patient ID: [name], female    DOB: [birth year], 82 y.o.   MRN: [id number]  HPI She is here for follow up.  S/p knee replacement: she is almost down with home PT and will regular PT soon.  She is doing well with recovery.  She is only on extra strength tylenol and a muscle relaxer.    Diabetes: She is taking her medication daily as prescribed. She is compliant with a diabetic diet. She is not exercising regularly,but is going PT. She monitors her sugars and they have been running 120 - 130. She checks her feet daily and denies foot lesions. She is up-to-date with an ophthalmology examination.   Hypertension: She is taking her medication daily. She is compliant with a low sodium diet.  She denies chest pain, palpitations, edema, shortness of breath and regular headaches. She is exercising regularly.  She does not monitor her blood pressure at home.    Numbness fingers:  For a couple of years she has had numbness in her left 4-5th fingers.  She has been told she has some atrophy of the muscles on that hand.  She denies wrist, elbow and neck pain.  If she puts pressure on her elbow it can increase her symptoms.   Medications and allergies reviewed with patient and updated if appropriate.  Patient Active Problem List   Diagnosis Date Noted  . Arthritis of right knee 06/28/2016  . Primary osteoarthritis of right knee 06/27/2016  . Pre-operative cardiovascular examination 06/11/2016  . Rash and nonspecific skin eruption 06/09/2016  . Pain of left lower extremity 06/09/2016  . Diabetes ([place]) 03/07/2016  . Acute encephalopathy   . Acute renal failure superimposed on stage 3 chronic kidney disease ([place])   . Acute on chronic diastolic CHF (congestive heart failure) ([place])   . Acute respiratory failure with hypoxia ([place]) 05/24/2015  . CAP (community acquired pneumonia) 05/24/2015  . Abnormal urinalysis 05/24/2015  . Sepsis ([place]) 05/24/2015  . OSA on CPAP 08/28/2014    . DJD (degenerative joint disease) of knee 08/16/2014  . Chronic diastolic heart failure ([place]) 07/30/2014  . Nocturnal hypoxemia 07/09/2014  . Dyspnea 05/15/2014  . Pulmonary HTN ([place]) 05/15/2014  . Crescendo angina ([place]) 05/02/2014  . Band keratopathy 01/16/2014  . 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 Epiphora due to insufficient drainage 06/13/2013  . Hyperthyroidism 11/04/2011  . Multinodular goiter 10/14/2011  . Atrophy, Fuchs' 09/29/2011  . LUMBAR RADICULOPATHY, RIGHT 09/23/2010  . ATRIAL FIBRILLATION 06/03/2010  . CHF 02/17/2010  . VITILIGO 11/07/2009  . SINOATRIAL NODE DYSFUNCTION 06/16/2009  . Iron deficiency anemia 01/02/2009  . ANEMIA, PERNICIOUS 11/20/2008  . DEGENERATIVE JOINT DISEASE 11/20/2008  . Atrial flutter ([place]) 06/25/2008  . HYPERLIPIDEMIA 01/29/2008  . Coronary atherosclerosis 01/29/2008  . HTN (hypertension) 10/30/2007    Current Outpatient Prescriptions on File Prior to Visit  Medication Sig Dispense Refill  . acetaminophen (TYLENOL) 500 MG tablet Take 1,000 mg by mouth 2 (two) times daily.    Marland Kitchen apixaban (ELIQUIS) 2.5 MG TABS tablet Take 1 tablet (2.5 mg total) by mouth 2 (two) times daily. 60 tablet 0  . carvedilol (COREG) 12.5 MG tablet Take 1 tablet (12.5 mg total) by mouth 2 (two) times daily with a meal. 180 tablet 3  . furosemide (LASIX) 40 MG tablet Take 2 tablets (80 mg total) by mouth daily. 180 tablet 3  . glucose blood (ONE TOUCH TEST STRIPS) test strip Use to check blood sugar  2 times per day. Dx Code E11.9 (ONE TOUCH VERIO) 200 each 2  . insulin NPH Human (HUMULIN N,NOVOLIN N) 100 UNIT/ML injection Inject 40 Units into the skin daily.     . Lancets (ONETOUCH ULTRASOFT) lancets Use to check blood sugar 2 times per day. Dx Code E11.9 200 each 2  . methimazole (TAPAZOLE) 5 MG tablet Take 5 mg by mouth 2 (two) times a week.    . Multiple Vitamins-Minerals (CENTRUM SILVER ULTRA WOMENS PO) Take by mouth.    . nitroGLYCERIN (NITROSTAT) 0.4 MG  SL tablet Place 0.4 mg under the tongue every 5 (five) minutes as needed for chest pain. Reported on 01/28/2016    . potassium chloride SA (K-DUR,KLOR-CON) 20 MEQ tablet Take 20-40 mEq by mouth daily. Take 1 tablet (56mEq) daily alternating with 2 tablets (29mEq) daily)    . tiotropium (SPIRIVA) 18 MCG inhalation capsule Place 1 capsule (18 mcg total) into inhaler and inhale daily. 90 capsule 2  . tizanidine (ZANAFLEX) 2 MG capsule Take 1 capsule (2 mg total) by mouth 3 (three) times daily. 60 capsule 0  . Vitamin D, Cholecalciferol, 1000 units TABS Take 2,000 Units by mouth daily.      No current facility-administered medications on file prior to visit.     Past Medical History:  Diagnosis Date  . Anemia    iron defficiency  . Anginal pain ([place])   . Arthritis    "knees, feet, hands; joints" (05/27/2015)  . Asthma   . Atrial fibrillation or flutter    s/p RFCA 7/08;   s/p DCCV in past;   previously on amiodarone;  amio stopped due to lung toxicity  . CAD (coronary artery disease)    s/p NSTEMI tx with BMS to OM1 3/08;  cath 3/08: pOM 99% tx with PCI, pLAD 20%, ? mod stenosis at the AM  . Chronic diastolic heart failure ([place])    echo 11/11:  EF 55-60%, severe LVH, mod LAE, mild MR, mildly increased PASP  . COPD (chronic obstructive pulmonary disease) ([place])   . Degenerative joint disease   . Dystrophy, corneal stromal   . Heart murmur   . HLD (hyperlipidemia)   . HTN (hypertension)    essential nos  . Hypopotassemia    PMH of  . Muscle pain   . Myocardial infarction ([place]) 2008  . OSA on CPAP   . Osteoporosis   . Pneumonia 05/27/2015  . Protein calorie malnutrition ([place])   . Rash and nonspecific skin eruption    both arms,awaiting bio   dr Delman Cheadle  . Seasonal allergies   . Shortness of breath dyspnea   . Type II diabetes mellitus ([place])    Dr Loanne Drilling    Past Surgical History:  Procedure Laterality Date  . A FLUTTER ABLATION     Dr Lovena Le  . CATARACT EXTRACTION W/ INTRAOCULAR  LENS  IMPLANT, BILATERAL Bilateral   . CHOLECYSTECTOMY    . COLONOSCOPY  6/07   2 polyps Dr. Kinnie Feil in [place]  . colonoscopy with polypectomy      X 2; [facility]  . CORNEAL TRANSPLANT Bilateral   . CORONARY ANGIOPLASTY WITH STENT PLACEMENT  02/2007   [facility] w/Dr Lovena Le  . EYE SURGERY    . gravid 2 para 2    . KNEE CARTILAGE SURGERY Right   . LEFT AND RIGHT HEART CATHETERIZATION WITH CORONARY ANGIOGRAM N/A 05/07/2014   Procedure: LEFT AND RIGHT HEART CATHETERIZATION WITH CORONARY ANGIOGRAM;  Surgeon: Peter M Martinique, MD;  Location: [facility];  Service: Cardiovascular;  Laterality: N/A;  . TOTAL KNEE ARTHROPLASTY Right 06/28/2016   Procedure: TOTAL KNEE ARTHROPLASTY;  Surgeon: Frederik Pear, MD;  Location: [facility];  Service: Orthopedics;  Laterality: Right;    Social History   Social History  . Marital status: Widowed    Spouse name: N/A  . Number of children: N/A  . Years of education: N/A   Occupational History  . Teacher    Social History Main Topics  . Smoking status: Former Smoker    Packs/day: 0.25    Years: 18.00    Types: Cigarettes    Quit date: 12/27/1968  . Smokeless tobacco: Never Used     Comment: smoked [place], up to 1 pp week  . Alcohol use Yes     Comment: occ  . Drug use: No  . Sexual activity: Yes   Other Topics Concern  . Not on file   Social History Narrative   Teacher. Widowed. Rarely drinks cafeine.     Family History  Problem Relation Age of Onset  . Diabetes Mother   . Hypertension Mother   . Transient ischemic attack Mother   . Heart attack Father 59  . Arthritis Father   . Breast cancer Maternal Aunt   . Arthritis Maternal Aunt   . Arthritis Mother   . Hypertension Father     Review of Systems  Constitutional: Positive for appetite change (decreased slightly). Negative for fever.  Respiratory: Positive for shortness of breath. Negative for cough and wheezing.   Cardiovascular: Negative for chest pain, palpitations and leg swelling.    Gastrointestinal: Negative for abdominal pain and nausea.       No gerd   Genitourinary: Negative for dysuria and hematuria.  Neurological: Negative for dizziness, light-headedness and headaches.       Objective:   Vitals:   07/27/16 1434  BP: (!) 146/78  Pulse: 94  Resp: 16  Temp: 97.8 F (36.6 C)   Filed Weights   07/27/16 1434  Weight: 174 lb (78.9 kg)   Body mass index is 25.7 kg/m.   Physical Exam Constitutional: Appears well-developed and well-nourished. No distress.  HENT:  Head: Normocephalic and atraumatic.  Neck: Neck supple. No tracheal deviation present. No thyromegaly present.  Cardiovascular: Normal rate, regular rhythm and normal heart sounds.   No murmur heard. No carotid bruit  Pulmonary/Chest: Effort normal and breath sounds normal. No respiratory distress. No has no wheezes. No rales.  Musculoskeletal: trace edema. mild muscle atrophy left medial aspect of hand Neuro: decreased sensation 4-5th fingers on left hand Lymphadenopathy: No cervical adenopathy.  Skin: Skin is warm and dry. Not diaphoretic.  Psychiatric: Normal mood and affect. Behavior is normal.         Assessment & Plan:   See Problem List for Assessment and Plan of chronic medical problems.

## 2016-07-27 NOTE — Assessment & Plan Note (Signed)
Left 4-5 fingers, some muscle atrophy Present for 2-3 years No joint pain Discussed EMG and then imaging of affected area  - she is still recovering from surgery and will defer for now, but let me know when she wants to pursue this

## 2016-07-27 NOTE — Assessment & Plan Note (Signed)
S/p Right TKR Recovering well Doing PT

## 2016-07-27 NOTE — Progress Notes (Signed)
Pre visit review using our clinic review tool, if applicable. No additional management support is needed unless otherwise documented below in the visit note. 

## 2016-07-27 NOTE — Patient Instructions (Addendum)
  Medications reviewed and updated.  No changes recommended at this time.     Please followup in 6 months   

## 2016-07-27 NOTE — Assessment & Plan Note (Signed)
Has been well controlled, slightly elevated here today Advised to monitor  No change in medications today

## 2016-07-28 DIAGNOSIS — E43 Unspecified severe protein-calorie malnutrition: Secondary | ICD-10-CM | POA: Diagnosis not present

## 2016-07-28 DIAGNOSIS — Z794 Long term (current) use of insulin: Secondary | ICD-10-CM | POA: Diagnosis not present

## 2016-07-28 DIAGNOSIS — E785 Hyperlipidemia, unspecified: Secondary | ICD-10-CM | POA: Diagnosis not present

## 2016-07-28 DIAGNOSIS — Z955 Presence of coronary angioplasty implant and graft: Secondary | ICD-10-CM | POA: Diagnosis not present

## 2016-07-28 DIAGNOSIS — Z7901 Long term (current) use of anticoagulants: Secondary | ICD-10-CM | POA: Diagnosis not present

## 2016-07-28 DIAGNOSIS — E119 Type 2 diabetes mellitus without complications: Secondary | ICD-10-CM | POA: Diagnosis not present

## 2016-07-28 DIAGNOSIS — Z471 Aftercare following joint replacement surgery: Secondary | ICD-10-CM | POA: Diagnosis not present

## 2016-07-28 DIAGNOSIS — Z96651 Presence of right artificial knee joint: Secondary | ICD-10-CM | POA: Diagnosis not present

## 2016-07-28 DIAGNOSIS — J449 Chronic obstructive pulmonary disease, unspecified: Secondary | ICD-10-CM | POA: Diagnosis not present

## 2016-07-28 DIAGNOSIS — E876 Hypokalemia: Secondary | ICD-10-CM | POA: Diagnosis not present

## 2016-07-28 DIAGNOSIS — I2510 Atherosclerotic heart disease of native coronary artery without angina pectoris: Secondary | ICD-10-CM | POA: Diagnosis not present

## 2016-07-28 DIAGNOSIS — I110 Hypertensive heart disease with heart failure: Secondary | ICD-10-CM | POA: Diagnosis not present

## 2016-07-28 DIAGNOSIS — I5032 Chronic diastolic (congestive) heart failure: Secondary | ICD-10-CM | POA: Diagnosis not present

## 2016-07-30 ENCOUNTER — Telehealth (HOSPITAL_COMMUNITY): Payer: Self-pay | Admitting: Pharmacist

## 2016-07-30 DIAGNOSIS — E119 Type 2 diabetes mellitus without complications: Secondary | ICD-10-CM | POA: Diagnosis not present

## 2016-07-30 DIAGNOSIS — Z955 Presence of coronary angioplasty implant and graft: Secondary | ICD-10-CM | POA: Diagnosis not present

## 2016-07-30 DIAGNOSIS — I2510 Atherosclerotic heart disease of native coronary artery without angina pectoris: Secondary | ICD-10-CM | POA: Diagnosis not present

## 2016-07-30 DIAGNOSIS — I110 Hypertensive heart disease with heart failure: Secondary | ICD-10-CM | POA: Diagnosis not present

## 2016-07-30 DIAGNOSIS — E43 Unspecified severe protein-calorie malnutrition: Secondary | ICD-10-CM | POA: Diagnosis not present

## 2016-07-30 DIAGNOSIS — Z96651 Presence of right artificial knee joint: Secondary | ICD-10-CM | POA: Diagnosis not present

## 2016-07-30 DIAGNOSIS — Z471 Aftercare following joint replacement surgery: Secondary | ICD-10-CM | POA: Diagnosis not present

## 2016-07-30 DIAGNOSIS — E785 Hyperlipidemia, unspecified: Secondary | ICD-10-CM | POA: Diagnosis not present

## 2016-07-30 DIAGNOSIS — E876 Hypokalemia: Secondary | ICD-10-CM | POA: Diagnosis not present

## 2016-07-30 DIAGNOSIS — Z7901 Long term (current) use of anticoagulants: Secondary | ICD-10-CM | POA: Diagnosis not present

## 2016-07-30 DIAGNOSIS — J449 Chronic obstructive pulmonary disease, unspecified: Secondary | ICD-10-CM | POA: Diagnosis not present

## 2016-07-30 DIAGNOSIS — I5032 Chronic diastolic (congestive) heart failure: Secondary | ICD-10-CM | POA: Diagnosis not present

## 2016-07-30 DIAGNOSIS — Z794 Long term (current) use of insulin: Secondary | ICD-10-CM | POA: Diagnosis not present

## 2016-07-30 NOTE — Telephone Encounter (Signed)
[employer] approved patient assistance for Eliquis through 12/26/16.   Ruta Hinds. Velva Harman, PharmD, BCPS, CPP Clinical Pharmacist Pager: [phone] Phone: [phone] 07/30/2016 8:58 AM

## 2016-08-02 DIAGNOSIS — M1711 Unilateral primary osteoarthritis, right knee: Secondary | ICD-10-CM | POA: Diagnosis not present

## 2016-08-02 DIAGNOSIS — M25561 Pain in right knee: Secondary | ICD-10-CM | POA: Diagnosis not present

## 2016-08-04 DIAGNOSIS — M25561 Pain in right knee: Secondary | ICD-10-CM | POA: Diagnosis not present

## 2016-08-04 DIAGNOSIS — M1711 Unilateral primary osteoarthritis, right knee: Secondary | ICD-10-CM | POA: Diagnosis not present

## 2016-08-09 DIAGNOSIS — M25561 Pain in right knee: Secondary | ICD-10-CM | POA: Diagnosis not present

## 2016-08-09 DIAGNOSIS — M1711 Unilateral primary osteoarthritis, right knee: Secondary | ICD-10-CM | POA: Diagnosis not present

## 2016-08-10 ENCOUNTER — Other Ambulatory Visit: Payer: Self-pay | Admitting: Internal Medicine

## 2016-08-11 DIAGNOSIS — M1711 Unilateral primary osteoarthritis, right knee: Secondary | ICD-10-CM | POA: Diagnosis not present

## 2016-08-11 DIAGNOSIS — M25561 Pain in right knee: Secondary | ICD-10-CM | POA: Diagnosis not present

## 2016-08-16 DIAGNOSIS — M1711 Unilateral primary osteoarthritis, right knee: Secondary | ICD-10-CM | POA: Diagnosis not present

## 2016-08-16 DIAGNOSIS — M25561 Pain in right knee: Secondary | ICD-10-CM | POA: Diagnosis not present

## 2016-08-18 DIAGNOSIS — M1711 Unilateral primary osteoarthritis, right knee: Secondary | ICD-10-CM | POA: Diagnosis not present

## 2016-08-18 DIAGNOSIS — M25561 Pain in right knee: Secondary | ICD-10-CM | POA: Diagnosis not present

## 2016-08-25 DIAGNOSIS — M1711 Unilateral primary osteoarthritis, right knee: Secondary | ICD-10-CM | POA: Diagnosis not present

## 2016-08-25 DIAGNOSIS — M25561 Pain in right knee: Secondary | ICD-10-CM | POA: Diagnosis not present

## 2016-08-26 ENCOUNTER — Encounter: Payer: Self-pay | Admitting: Nurse Practitioner

## 2016-08-26 ENCOUNTER — Other Ambulatory Visit: Payer: Medicare Other

## 2016-08-26 ENCOUNTER — Ambulatory Visit (INDEPENDENT_AMBULATORY_CARE_PROVIDER_SITE_OTHER): Payer: Medicare Other | Admitting: Nurse Practitioner

## 2016-08-26 VITALS — BP 142/90 | HR 95 | Temp 97.8°F | Wt 170.0 lb

## 2016-08-26 DIAGNOSIS — R112 Nausea with vomiting, unspecified: Secondary | ICD-10-CM | POA: Diagnosis not present

## 2016-08-26 DIAGNOSIS — Z794 Long term (current) use of insulin: Secondary | ICD-10-CM

## 2016-08-26 DIAGNOSIS — N183 Chronic kidney disease, stage 3 unspecified: Secondary | ICD-10-CM

## 2016-08-26 DIAGNOSIS — R8290 Unspecified abnormal findings in urine: Secondary | ICD-10-CM | POA: Diagnosis not present

## 2016-08-26 DIAGNOSIS — H8113 Benign paroxysmal vertigo, bilateral: Secondary | ICD-10-CM | POA: Diagnosis not present

## 2016-08-26 DIAGNOSIS — E1122 Type 2 diabetes mellitus with diabetic chronic kidney disease: Secondary | ICD-10-CM | POA: Diagnosis not present

## 2016-08-26 LAB — POCT URINALYSIS DIPSTICK
Bilirubin, UA: NEGATIVE
GLUCOSE UA: POSITIVE
Ketones, UA: POSITIVE
NITRITE UA: NEGATIVE
Protein, UA: POSITIVE
RBC UA: POSITIVE
Spec Grav, UA: 1.025
UROBILINOGEN UA: 0.2
pH, UA: 6

## 2016-08-26 LAB — POCT CBG (FASTING - GLUCOSE)-MANUAL ENTRY: Glucose Fasting, POC: 260 mg/dL — AB (ref 70–99)

## 2016-08-26 MED ORDER — ONDANSETRON HCL 4 MG PO TABS: 4.0000 mg | ORAL_TABLET | Freq: Three times a day (TID) | ORAL | 0 refills | Status: DC | PRN

## 2016-08-26 MED ORDER — ONDANSETRON HCL 4 MG/2ML IJ SOLN
4.0000 mg | Freq: Once | INTRAMUSCULAR | Status: AC
  Administered 2016-08-26: 4 mg via INTRAMUSCULAR

## 2016-08-26 NOTE — Progress Notes (Signed)
Pre visit review using our clinic review tool, if applicable. No additional management support is needed unless otherwise documented below in the visit note. 

## 2016-08-26 NOTE — Patient Instructions (Signed)
Check blood sugar twice a day. Take insulin as prescribed. Maintain clear liquids diet for 48hrs, then advance as tolerated. Add greasy, spicy, and diary products last. Go to  ED if no improvement in 24hrs. Home Instructions.  Drink enough fluids to keep your urine clear or pale yellow. Drink small amounts of fluids frequently and increase the amounts as tolerated.  Ask your caregiver for specific rehydration instructions.  Avoid:  Foods high in sugar.  Alcohol.  Carbonated drinks.  Tobacco.  Juice.  Caffeine drinks.  Extremely hot or cold fluids.  Fatty, greasy foods.  Too much intake of anything at one time.  Dairy products until 24 to 48 hours after diarrhea stops.  You may consume probiotics. Probiotics are active cultures of beneficial bacteria. They may lessen the amount and number of diarrheal stools in adults. Probiotics can be found in yogurt with active cultures and in supplements.  Wash your hands well to avoid spreading the virus.  Only take over-the-counter or prescription medicines for pain, discomfort, or fever as directed by your caregiver. Do not give aspirin to children. Antidiarrheal medicines are not recommended.  Ask your caregiver if you should continue to take your regular prescribed and over-the-counter medicines.  Keep all follow-up appointments as directed by your caregiver. SEEK IMMEDIATE MEDICAL CARE IF:   You are unable to keep fluids down.  You do not urinate at least once every 6 to 8 hours.  You develop shortness of breath.  You notice blood in your stool or vomit. This may look like coffee grounds.  You have abdominal pain that increases or is concentrated in one small area (localized).  You have persistent vomiting or diarrhea.  You have a fever.  The patient is a child younger than 3 months, and he or she has a fever.  The patient is a child older than 3 months, and he or she has a fever and persistent symptoms.  The  patient is a child older than 3 months, and he or she has a fever and symptoms suddenly get worse.  The patient is a baby, and he or she has no tears when crying. MAKE SURE YOU:   Understand these instructions.  Will watch your condition.  Will get help right away if you are not doing well or get worse.   This information is not intended to replace advice given to you by your health care provider. Make sure you discuss any questions you have with your health care provider.   Document Released: 12/13/2005 Document Revised: 03/06/2012 Document Reviewed: 09/29/2011 Elsevier Interactive Patient Education [employer].

## 2016-08-26 NOTE — Progress Notes (Addendum)
Subjective:  Patient ID: [name], female    DOB: 1931-02-04  Age: 82 y.o. MRN: [id number]  CC: Emesis (since yesterday evening while eating her dinner around 7 pm, vomitted once this morning. Can;t keep water down. )   [name] presents for nausea, vomiting x 1day,  dizziness x 3days She lives with son who is in exam room today.  Emesis   This is a new problem. The current episode started yesterday. The problem occurs 2 to 4 times per day. The problem has been gradually worsening. The emesis has an appearance of bile and stomach contents. There has been no fever. Associated symptoms include dizziness. Pertinent negatives include no abdominal pain, chest pain, chills, coughing, diarrhea, fever, headaches, myalgias, sweats or URI. She has tried bed rest for the symptoms. The treatment provided no relief.  Dizziness  This is a new problem. The current episode started in the past 7 days. The problem occurs intermittently. The problem has been unchanged. Associated symptoms include anorexia, nausea, vertigo and vomiting. Pertinent negatives include no abdominal pain, change in bowel habit, chest pain, chills, congestion, coughing, fever, headaches, myalgias, rash, sore throat, swollen glands, urinary symptoms, visual change or weakness. Exacerbated by: rapid head movement. She has tried nothing for the symptoms.    Diabetes: Has not checked CBG today. Has not taken insulin dose today. Last checked yesterday morning, was 130. Has not been able to eat or drink without vomiting.  Was given climdamycin 600mg  x 1dose on Tuesday by dentist prior to dental procedure. Had R. knee replacement 53months ago.  Outpatient Medications Prior to Visit  Medication Sig Dispense Refill  . acetaminophen (TYLENOL) 500 MG tablet Take 1,000 mg by mouth 2 (two) times daily.    Marland Kitchen apixaban (ELIQUIS) 2.5 MG TABS tablet Take 1 tablet (2.5 mg total) by mouth 2 (two) times daily. 60 tablet 0  .  carvedilol (COREG) 12.5 MG tablet Take 1 tablet (12.5 mg total) by mouth 2 (two) times daily with a meal. 180 tablet 3  . furosemide (LASIX) 40 MG tablet Take 2 tablets (80 mg total) by mouth daily. 180 tablet 3  . glucose blood (ONE TOUCH TEST STRIPS) test strip Use to check blood sugar 2 times per day. Dx Code E11.9 (ONE TOUCH VERIO) 200 each 2  . insulin NPH Human (HUMULIN N,NOVOLIN N) 100 UNIT/ML injection Inject 40 Units into the skin daily.     . Lancets (ONETOUCH ULTRASOFT) lancets Use to check blood sugar 2 times per day. Dx Code E11.9 200 each 2  . methimazole (TAPAZOLE) 5 MG tablet Take 5 mg by mouth 2 (two) times a week.    . Multiple Vitamins-Minerals (CENTRUM SILVER ULTRA WOMENS PO) Take by mouth.    . nitroGLYCERIN (NITROSTAT) 0.4 MG SL tablet Place 0.4 mg under the tongue every 5 (five) minutes as needed for chest pain. Reported on 01/28/2016    . potassium chloride SA (K-DUR,KLOR-CON) 20 MEQ tablet Take 20-40 mEq by mouth daily. Take 1 tablet (10mEq) daily alternating with 2 tablets (60mEq) daily)    . SPIRIVA HANDIHALER 18 MCG inhalation capsule PLACE 1 CAPSULE INTO INHALER AND INHALE ONCE DAILY 30 capsule 5  . tiotropium (SPIRIVA) 18 MCG inhalation capsule Place 1 capsule (18 mcg total) into inhaler and inhale daily. 90 capsule 2  . tizanidine (ZANAFLEX) 2 MG capsule Take 1 capsule (2 mg total) by mouth 3 (three) times daily. 60 capsule 0  . Vitamin D, Cholecalciferol, 1000 units TABS  Take 2,000 Units by mouth daily.      No facility-administered medications prior to visit.     ROS See HPI  Objective:  BP (!) 142/90   Pulse 95   Wt 170 lb (77.1 kg)   SpO2 94%   BMI 25.10 kg/m   BP Readings from Last 3 Encounters:  08/26/16 (!) 142/90  07/27/16 (!) 146/78  07/16/16 134/66    Wt Readings from Last 3 Encounters:  08/26/16 170 lb (77.1 kg)  07/27/16 174 lb (78.9 kg)  07/16/16 193 lb 9.6 oz (87.8 kg)    Physical Exam  Constitutional: She is oriented to person,  place, and time. She appears well-developed.  Neck: Normal range of motion. Neck supple.  Cardiovascular: Normal rate.   Pulmonary/Chest: Effort normal and breath sounds normal. No respiratory distress.  Abdominal: Soft. Bowel sounds are normal. She exhibits no distension. There is no tenderness.  Neurological: She is alert and oriented to person, place, and time.  Vitals reviewed.   Lab Results  Component Value Date   WBC 12.1 (H) 06/30/2016   HGB 12.5 06/30/2016   HCT 40.3 06/30/2016   PLT 154 06/30/2016   GLUCOSE 138 (H) 06/29/2016   CHOL 317 (H) 03/04/2016   TRIG 122.0 03/04/2016   HDL 51.70 03/04/2016   LDLDIRECT 144.6 02/03/2010   LDLCALC 241 (H) 03/04/2016   ALT 16 07/02/2016   ALT 16 07/02/2016   AST 25 07/02/2016   AST 25 07/02/2016   NA 138 07/02/2016   NA 138 07/02/2016   K 3.8 07/02/2016   K 3.8 07/02/2016   CL 106 06/29/2016   CREATININE 1.0 07/02/2016   CREATININE 1.0 07/02/2016   BUN 25 (A) 07/02/2016   BUN 25 (A) 07/02/2016   CO2 21 (L) 06/29/2016   TSH 1.77 06/09/2016   INR 1.16 06/28/2016   HGBA1C 8.4 (H) 06/09/2016   MICROALBUR 11.0 (H) 02/06/2015    No results found.  Assessment & Plan:   [name] was seen today for emesis.  Diagnoses and all orders for this visit:  Benign paroxysmal positional vertigo, bilateral -     POCT Urinalysis Dipstick  Intractable vomiting with nausea, unspecified vomiting type -     POCT Urinalysis Dipstick -     ondansetron (ZOFRAN) injection 4 mg; Inject 2 mLs (4 mg total) into the muscle once. -     ondansetron (ZOFRAN) 4 MG tablet; Take 1 tablet (4 mg total) by mouth every 8 (eight) hours as needed for nausea or vomiting. -     Urine culture; Future  Type 2 diabetes mellitus with stage 3 chronic kidney disease, with long-term current use of insulin (HCC) -     POCT CBG (Fasting - Glucose)   I am having [name] start on ondansetron. I am also having her maintain her nitroGLYCERIN, furosemide, insulin  NPH Human, Vitamin D (Cholecalciferol), onetouch ultrasoft, glucose blood, methimazole, carvedilol, tiotropium, Multiple Vitamins-Minerals (CENTRUM SILVER ULTRA WOMENS PO), potassium chloride SA, tizanidine, apixaban, acetaminophen, and SPIRIVA HANDIHALER. We administered ondansetron.  Meds ordered this encounter  Medications  . ondansetron (ZOFRAN) injection 4 mg  . ondansetron (ZOFRAN) 4 MG tablet    Sig: Take 1 tablet (4 mg total) by mouth every 8 (eight) hours as needed for nausea or vomiting.    Dispense:  20 tablet    Refill:  0    Order Specific Question:   Supervising Provider    Answer:   Cassandria Anger [[id number]]    Follow-up:  Return if symptoms worsen or fail to improve.  Wilfred Lacy, NP

## 2016-08-29 ENCOUNTER — Encounter (HOSPITAL_COMMUNITY): Payer: Self-pay

## 2016-08-29 ENCOUNTER — Emergency Department (HOSPITAL_COMMUNITY)
Admission: EM | Admit: 2016-08-29 | Discharge: 2016-08-29 | Disposition: A | Discharge status: Home / Self Care | Payer: Medicare Other | Attending: Emergency Medicine | Admitting: Emergency Medicine

## 2016-08-29 DIAGNOSIS — Z7901 Long term (current) use of anticoagulants: Secondary | ICD-10-CM | POA: Diagnosis not present

## 2016-08-29 DIAGNOSIS — E119 Type 2 diabetes mellitus without complications: Secondary | ICD-10-CM | POA: Insufficient documentation

## 2016-08-29 DIAGNOSIS — E860 Dehydration: Secondary | ICD-10-CM | POA: Insufficient documentation

## 2016-08-29 DIAGNOSIS — I110 Hypertensive heart disease with heart failure: Secondary | ICD-10-CM | POA: Diagnosis not present

## 2016-08-29 DIAGNOSIS — Z794 Long term (current) use of insulin: Secondary | ICD-10-CM | POA: Diagnosis not present

## 2016-08-29 DIAGNOSIS — Z87891 Personal history of nicotine dependence: Secondary | ICD-10-CM | POA: Insufficient documentation

## 2016-08-29 DIAGNOSIS — R1110 Vomiting, unspecified: Secondary | ICD-10-CM | POA: Diagnosis present

## 2016-08-29 DIAGNOSIS — I5032 Chronic diastolic (congestive) heart failure: Secondary | ICD-10-CM | POA: Insufficient documentation

## 2016-08-29 DIAGNOSIS — Z79899 Other long term (current) drug therapy: Secondary | ICD-10-CM | POA: Insufficient documentation

## 2016-08-29 DIAGNOSIS — R112 Nausea with vomiting, unspecified: Secondary | ICD-10-CM

## 2016-08-29 DIAGNOSIS — J449 Chronic obstructive pulmonary disease, unspecified: Secondary | ICD-10-CM | POA: Insufficient documentation

## 2016-08-29 DIAGNOSIS — N390 Urinary tract infection, site not specified: Secondary | ICD-10-CM | POA: Insufficient documentation

## 2016-08-29 DIAGNOSIS — Z955 Presence of coronary angioplasty implant and graft: Secondary | ICD-10-CM | POA: Diagnosis not present

## 2016-08-29 DIAGNOSIS — I252 Old myocardial infarction: Secondary | ICD-10-CM | POA: Diagnosis not present

## 2016-08-29 DIAGNOSIS — Z96651 Presence of right artificial knee joint: Secondary | ICD-10-CM | POA: Insufficient documentation

## 2016-08-29 DIAGNOSIS — I2510 Atherosclerotic heart disease of native coronary artery without angina pectoris: Secondary | ICD-10-CM | POA: Insufficient documentation

## 2016-08-29 LAB — I-STAT CG4 LACTIC ACID, ED: Lactic Acid, Venous: 2.71 mmol/L (ref 0.5–1.9)

## 2016-08-29 LAB — URINALYSIS, ROUTINE W REFLEX MICROSCOPIC
BILIRUBIN URINE: NEGATIVE
Glucose, UA: 100 mg/dL — AB
Ketones, ur: NEGATIVE mg/dL
NITRITE: POSITIVE — AB
Protein, ur: 100 mg/dL — AB
SPECIFIC GRAVITY, URINE: 1.027 (ref 1.005–1.030)
pH: 5.5 (ref 5.0–8.0)

## 2016-08-29 LAB — CBC WITH DIFFERENTIAL/PLATELET
BASOS ABS: 0 10*3/uL (ref 0.0–0.1)
Basophils Relative: 0 %
EOS PCT: 2 %
Eosinophils Absolute: 0.1 10*3/uL (ref 0.0–0.7)
HEMATOCRIT: 50.1 % — AB (ref 36.0–46.0)
HEMOGLOBIN: 16.2 g/dL — AB (ref 12.0–15.0)
LYMPHS PCT: 15 %
Lymphs Abs: 1.1 10*3/uL (ref 0.7–4.0)
MCH: 30.2 pg (ref 26.0–34.0)
MCHC: 32.3 g/dL (ref 30.0–36.0)
MCV: 93.5 fL (ref 78.0–100.0)
Monocytes Absolute: 0.5 10*3/uL (ref 0.1–1.0)
Monocytes Relative: 7 %
NEUTROS ABS: 5.4 10*3/uL (ref 1.7–7.7)
NEUTROS PCT: 76 %
PLATELETS: 215 10*3/uL (ref 150–400)
RBC: 5.36 MIL/uL — AB (ref 3.87–5.11)
RDW: 13.7 % (ref 11.5–15.5)
WBC: 7.1 10*3/uL (ref 4.0–10.5)

## 2016-08-29 LAB — URINE MICROSCOPIC-ADD ON

## 2016-08-29 LAB — COMPREHENSIVE METABOLIC PANEL
ALBUMIN: 3.1 g/dL — AB (ref 3.5–5.0)
ALK PHOS: 74 U/L (ref 38–126)
ALT: 17 U/L (ref 14–54)
AST: 26 U/L (ref 15–41)
Anion gap: 6 (ref 5–15)
BILIRUBIN TOTAL: 0.5 mg/dL (ref 0.3–1.2)
BUN: 21 mg/dL — AB (ref 6–20)
CALCIUM: 9.2 mg/dL (ref 8.9–10.3)
CO2: 26 mmol/L (ref 22–32)
CREATININE: 0.98 mg/dL (ref 0.44–1.00)
Chloride: 102 mmol/L (ref 101–111)
GFR calc Af Amer: >60 mL/min (ref >60)
GFR, EST NON AFRICAN AMERICAN: 52 mL/min — AB (ref >60)
GLUCOSE: 196 mg/dL — AB (ref 65–99)
POTASSIUM: 3.8 mmol/L (ref 3.5–5.1)
Sodium: 134 mmol/L — ABNORMAL LOW (ref 135–145)
TOTAL PROTEIN: 6.7 g/dL (ref 6.5–8.1)

## 2016-08-29 LAB — URINE CULTURE: Colony Count: >=100000

## 2016-08-29 LAB — LIPASE, BLOOD: LIPASE: 18 U/L (ref 11–51)

## 2016-08-29 MED ORDER — SULFAMETHOXAZOLE-TRIMETHOPRIM 800-160 MG PO TABS: 1.0000 | ORAL_TABLET | Freq: Two times a day (BID) | ORAL | 0 refills | Status: AC

## 2016-08-29 MED ORDER — METOCLOPRAMIDE HCL 5 MG/ML IJ SOLN
10.0000 mg | Freq: Once | INTRAMUSCULAR | Status: AC
  Administered 2016-08-29: 10 mg via INTRAVENOUS
  Filled 2016-08-29: qty 2

## 2016-08-29 MED ORDER — SODIUM CHLORIDE 0.9 % IV BOLUS (SEPSIS)
500.0000 mL | Freq: Once | INTRAVENOUS | Status: AC
  Administered 2016-08-29: 500 mL via INTRAVENOUS

## 2016-08-29 MED ORDER — ONDANSETRON HCL 4 MG PO TABS: 4.0000 mg | ORAL_TABLET | Freq: Three times a day (TID) | ORAL | 0 refills | Status: DC | PRN

## 2016-08-29 MED ORDER — SULFAMETHOXAZOLE-TRIMETHOPRIM 800-160 MG PO TABS
1.0000 | ORAL_TABLET | Freq: Once | ORAL | Status: AC
  Administered 2016-08-29: 1 via ORAL
  Filled 2016-08-29: qty 1

## 2016-08-29 NOTE — ED Provider Notes (Signed)
Pt seen by Dr Rex Kras.  Sx have improved after treatment in the ED.  Pt is stable for discharge.  UA is pending.  Will be able to go home after that result.   Dorie Rank, MD 08/31/16 2114

## 2016-08-29 NOTE — ED Notes (Signed)
Dr. Little at bedside.  

## 2016-08-29 NOTE — Discharge Instructions (Signed)
The UA does show a UTI.  Take the antibiotics as prescribed. Return to the ED as needed for recurrent, worsening symptoms

## 2016-08-29 NOTE — ED Notes (Signed)
Dr. Rex Kras aware of the pts lactic acid level.

## 2016-08-29 NOTE — ED Provider Notes (Signed)
[name] DEPT Provider Note   CSN: [id number] Arrival date & time: 08/29/16  1005     History   Chief Complaint Chief Complaint  Patient presents with  . Dizziness  . Emesis    HPI [name] is a 82 y.o. female.  81 year old female with extensive past medical history including CHF, atrial fibrillation, CAD, type 2 diabetes mellitus who presents with vomiting and lightheadedness. The patient states that she has had recurrent episodes of vomiting for the past 4 days. No associated abdominal pain. She was evaluated at PCP office for dizziness and the vomiting and was given Zofran which she has taken without relief. She has had occasional episodes of diarrhea but no fever, cough/cold symptoms, chest pain, shortness of breath. She reports a gradual onset of mild headache but no associated visual changes. She does endorse photophobia. She denies any room spinning or movement sensation but states that her dizziness is a lightheadedness. No sick contacts or recent travel.   The history is provided by the patient.    Past Medical History:  Diagnosis Date  . Anemia    iron defficiency  . Anginal pain ([place])   . Arthritis    "knees, feet, hands; joints" (05/27/2015)  . Asthma   . Atrial fibrillation or flutter    s/p RFCA 7/08;   s/p DCCV in past;   previously on amiodarone;  amio stopped due to lung toxicity  . CAD (coronary artery disease)    s/p NSTEMI tx with BMS to OM1 3/08;  cath 3/08: pOM 99% tx with PCI, pLAD 20%, ? mod stenosis at the AM  . Chronic diastolic heart failure ([place])    echo 11/11:  EF 55-60%, severe LVH, mod LAE, mild MR, mildly increased PASP  . COPD (chronic obstructive pulmonary disease) ([place])   . Degenerative joint disease   . Dystrophy, corneal stromal   . Heart murmur   . HLD (hyperlipidemia)   . HTN (hypertension)    essential nos  . Hypopotassemia    PMH of  . Muscle pain   . Myocardial infarction ([place]) 2008  . OSA on CPAP   .  Osteoporosis   . Pneumonia 05/27/2015  . Protein calorie malnutrition ([place])   . Rash and nonspecific skin eruption    both arms,awaiting bio   dr Delman Cheadle  . Seasonal allergies   . Shortness of breath dyspnea   . Type II diabetes mellitus ([place])    Dr Loanne Drilling    Patient Active Problem List   Diagnosis Date Noted  . Granuloma annulare 07/27/2016  . Numbness of fingers 07/27/2016  . Primary osteoarthritis of right knee 06/27/2016  . Diabetes ([place]) 03/07/2016  . Acute encephalopathy   . Acute renal failure superimposed on stage 3 chronic kidney disease ([place])   . Acute on chronic diastolic CHF (congestive heart failure) ([place])   . Acute respiratory failure with hypoxia ([place]) 05/24/2015  . CAP (community acquired pneumonia) 05/24/2015  . Sepsis ([place]) 05/24/2015  . OSA on CPAP 08/28/2014  . Chronic diastolic heart failure ([place]) 07/30/2014  . Nocturnal hypoxemia 07/09/2014  . Dyspnea 05/15/2014  . Pulmonary HTN ([place]) 05/15/2014  . Crescendo angina ([place]) 05/02/2014  . Band keratopathy 01/16/2014  . 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 Epiphora due to insufficient drainage 06/13/2013  . Hyperthyroidism 11/04/2011  . Multinodular goiter 10/14/2011  . Atrophy, Fuchs' 09/29/2011  . LUMBAR RADICULOPATHY, RIGHT 09/23/2010  . ATRIAL FIBRILLATION 06/03/2010  . CHF 02/17/2010  . VITILIGO 11/07/2009  .  SINOATRIAL NODE DYSFUNCTION 06/16/2009  . Iron deficiency anemia 01/02/2009  . ANEMIA, PERNICIOUS 11/20/2008  . Atrial flutter ([place]) 06/25/2008  . HYPERLIPIDEMIA 01/29/2008  . Coronary atherosclerosis 01/29/2008  . HTN (hypertension) 10/30/2007    Past Surgical History:  Procedure Laterality Date  . A FLUTTER ABLATION     Dr Lovena Le  . CATARACT EXTRACTION W/ INTRAOCULAR LENS  IMPLANT, BILATERAL Bilateral   . CHOLECYSTECTOMY    . COLONOSCOPY  6/07   2 polyps Dr. Kinnie Feil in [place]  . colonoscopy with polypectomy      X 2; [facility]  . CORNEAL TRANSPLANT Bilateral   . CORONARY  ANGIOPLASTY WITH STENT PLACEMENT  02/2007   BMS w/Dr Lovena Le  . EYE SURGERY    . gravid 2 para 2    . KNEE CARTILAGE SURGERY Right   . LEFT AND RIGHT HEART CATHETERIZATION WITH CORONARY ANGIOGRAM N/A 05/07/2014   Procedure: LEFT AND RIGHT HEART CATHETERIZATION WITH CORONARY ANGIOGRAM;  Surgeon: Peter M Martinique, MD;  Location[facility] CATH LAB;  Service: Cardiovascular;  Laterality: N/A;  . TOTAL KNEE ARTHROPLASTY Right 06/28/2016   Procedure: TOTAL KNEE ARTHROPLASTY;  Surgeon: Frederik Pear, MD;  Location: [facility];  Service: Orthopedics;  Laterality: Right;    OB History    No data available       Home Medications    Prior to Admission medications   Medication Sig Start Date End Date Taking? Authorizing Provider  acetaminophen (TYLENOL) 500 MG tablet Take 1,000 mg by mouth every 6 (six) hours as needed for moderate pain or headache.    Yes Historical Provider, MD  apixaban (ELIQUIS) 2.5 MG TABS tablet Take 1 tablet (2.5 mg total) by mouth 2 (two) times daily. 06/28/16  Yes Leighton Parody, PA-C  carvedilol (COREG) 12.5 MG tablet Take 1 tablet (12.5 mg total) by mouth 2 (two) times daily with a meal. 03/08/16  Yes Jolaine Artist, MD  diclofenac sodium (VOLTAREN) 1 % GEL Apply 2 g topically 2 (two) times daily as needed (for pain).   Yes Historical Provider, MD  furosemide (LASIX) 40 MG tablet Take 2 tablets (80 mg total) by mouth daily. 08/20/15  Yes Shaune Pascal Bensimhon, MD  glucose blood (ONE TOUCH TEST STRIPS) test strip Use to check blood sugar 2 times per day. Dx Code E11.9 (ONE TOUCH VERIO) 02/13/16  Yes Renato Shin, MD  insulin NPH Human (HUMULIN N,NOVOLIN N) 100 UNIT/ML injection Inject 40 Units into the skin daily.    Yes Historical Provider, MD  Lancets [facility] ULTRASOFT) lancets Use to check blood sugar 2 times per day. Dx Code E11.9 02/13/16  Yes Renato Shin, MD  methimazole (TAPAZOLE) 5 MG tablet Take 5 mg by mouth 2 (two) times a week. Wednesday and Friday.   Yes Historical Provider, MD    Multiple Vitamins-Minerals (CENTRUM SILVER ULTRA WOMENS PO) Take 1 tablet by mouth daily.    Yes Historical Provider, MD  potassium chloride SA (K-DUR,KLOR-CON) 20 MEQ tablet Take 20-40 mEq by mouth daily. Take 1 tablet (7mEq) daily alternating with 2 tablets (14mEq) daily)   Yes Historical Provider, MD  tiotropium (SPIRIVA) 18 MCG inhalation capsule Place 1 capsule (18 mcg total) into inhaler and inhale daily. 05/05/16  Yes [name], MD  Vitamin D, Cholecalciferol, 1000 units TABS Take 2,000 Units by mouth daily.    Yes Historical Provider, MD  nitroGLYCERIN (NITROSTAT) 0.4 MG SL tablet Place 0.4 mg under the tongue every 5 (five) minutes as needed for chest pain.  Reported on 01/28/2016 05/02/14   Evans Lance, MD  ondansetron (ZOFRAN) 4 MG tablet Take 1 tablet (4 mg total) by mouth every 8 (eight) hours as needed for nausea or vomiting. Patient not taking: Reported on 08/29/2016 08/26/16   Flossie Buffy, NP  tizanidine (ZANAFLEX) 2 MG capsule Take 1 capsule (2 mg total) by mouth 3 (three) times daily. Patient not taking: Reported on 08/29/2016 06/28/16   Leighton Parody, PA-C    Family History Family History  Problem Relation Age of Onset  . Diabetes Mother   . Hypertension Mother   . Transient ischemic attack Mother   . Arthritis Mother   . Heart attack Father 65  . Arthritis Father   . Hypertension Father   . Breast cancer Maternal Aunt   . Arthritis Maternal Aunt     Social History Social History  Substance Use Topics  . Smoking status: Former Smoker    Packs/day: 0.25    Years: 18.00    Types: Cigarettes    Quit date: 12/27/1968  . Smokeless tobacco: Never Used     Comment: smoked [place], up to 1 pp week  . Alcohol use Yes     Comment: occ     Allergies   Amlodipine besylate; Levaquin [levofloxacin]; Lobster [shellfish allergy]; Penicillins; Valsartan; Codeine; Oxycodone; and Tramadol hcl   Review of Systems Review of Systems 10 Systems reviewed and are  negative for acute change except as noted in the HPI.   Physical Exam Updated Vital Signs BP 162/86   Pulse 74   Temp 97.6 F (36.4 C) (Oral)   Resp 25   SpO2 99%   Physical Exam  Constitutional: She is oriented to person, place, and time. She appears well-developed and well-nourished. No distress.  HENT:  Head: Normocephalic and atraumatic.  Moist mucous membranes  Eyes: Conjunctivae are normal. Pupils are equal, round, and reactive to light.  Neck: Neck supple.  Cardiovascular: Normal rate and normal heart sounds.  An irregularly irregular rhythm present.  No murmur heard. Pulmonary/Chest: Effort normal and breath sounds normal.  Abdominal: Soft. Bowel sounds are normal. She exhibits no distension. There is no tenderness.  Musculoskeletal: She exhibits no edema.  Neurological: She is alert and oriented to person, place, and time.  Fluent speech  Skin: Skin is warm and dry.  Psychiatric: She has a normal mood and affect. Judgment normal.  pleasant  Nursing note and vitals reviewed.    ED Treatments / Results  Labs (all labs ordered are listed, but only abnormal results are displayed) Labs Reviewed  CBC WITH DIFFERENTIAL/PLATELET - Abnormal; Notable for the following:       Result Value   RBC 5.36 (*)    Hemoglobin 16.2 (*)    HCT 50.1 (*)    All other components within normal limits  COMPREHENSIVE METABOLIC PANEL - Abnormal; Notable for the following:    Sodium 134 (*)    Glucose, Bld 196 (*)    BUN 21 (*)    Albumin 3.1 (*)    GFR calc non Af Amer 52 (*)    All other components within normal limits  I-STAT CG4 LACTIC ACID, ED - Abnormal; Notable for the following:    Lactic Acid, Venous 2.71 (*)    All other components within normal limits  LIPASE, BLOOD  URINALYSIS, ROUTINE W REFLEX MICROSCOPIC (NOT AT [facility])    EKG  EKG Interpretation  Date/Time:  Sunday August 29 2016 10:09:58 EDT Ventricular Rate:  104 PR Interval:    QRS Duration: 106 QT  Interval:  360 QTC Calculation: 473 R Axis:   -93 Text Interpretation:  Atrial flutter with variable A-V block Right superior axis deviation Pulmonary disease pattern ST & T wave abnormality, consider lateral ischemia Abnormal ECG peaked T waves compared to previous, rate slightly faster Confirmed by LITTLE MD, RACHEL [id number]) on 08/29/2016 10:44:27 AM       Radiology No results found.  Procedures Procedures (including critical care time)  Medications Ordered in ED Medications  metoCLOPramide (REGLAN) injection 10 mg (10 mg Intravenous Given 08/29/16 1236)  sodium chloride 0.9 % bolus 500 mL (0 mLs Intravenous Stopped 08/29/16 1410)  sodium chloride 0.9 % bolus 500 mL (500 mLs Intravenous New Bag/Given 08/29/16 1553)     Initial Impression / Assessment and Plan / ED Course  I have reviewed the triage vital signs and the nursing notes.  Pertinent labs that were available during my care of the patient were reviewed by me and considered in my medical decision making (see chart for details).  Clinical Course   Patient with several days of intermittent vomiting as well as dizziness which she later describes as lightheadedness. She was well-appearing and pleasant on exam with reassuring vital signs. No abdominal tenderness. Gave the patient a small IV fluid bolus and obtained above lab work which shows normal creatinine, BUN 21, lactate 2.71. Normal WBC count. Normal lipase and LFTs and the patient denies any abdominal pain. She has no tenderness on exam therefore I do not feel she needs any abdominal imaging. She has had no neurologic symptoms to suggest intracranial cause of her vomiting. On reexamination, she states that she feels 100% better and has been able to drink a soda. I have discussed supportive care as well as return precautions. We will follow up on the patient's UA and she will likely be discharged home with PCP follow-up.  Final Clinical Impressions(s) / ED Diagnoses   Final  diagnoses:  None    New Prescriptions New Prescriptions   No medications on file     Sharlett Iles, MD 08/29/16 [phone]

## 2016-08-29 NOTE — ED Triage Notes (Signed)
Patient complains of dizziness and nausea with vomiting since Wednesday, seen by her primary MD and prescribed zofran. States that the medication has not helped the symptoms and continuing to vomit. Alert and oriented

## 2016-08-31 LAB — URINE CULTURE

## 2016-09-01 ENCOUNTER — Telehealth: Payer: Self-pay | Admitting: Emergency Medicine

## 2016-09-01 ENCOUNTER — Telehealth (HOSPITAL_BASED_OUTPATIENT_CLINIC_OR_DEPARTMENT_OTHER): Payer: Self-pay | Admitting: Emergency Medicine

## 2016-09-01 ENCOUNTER — Telehealth (HOSPITAL_COMMUNITY): Payer: Self-pay | Admitting: Vascular Surgery

## 2016-09-01 NOTE — Telephone Encounter (Signed)
Post ED Visit - Positive Culture Follow-up  Culture report reviewed by antimicrobial stewardship pharmacist:  []  Elenor Quinones, Pharm.D. []  Heide Guile, Pharm.D., BCPS []  Parks Neptune, Pharm.D. []  Alycia Rossetti, Pharm.D., BCPS []  [place], Pharm.D., BCPS, AAHIVP []  Legrand Como, Pharm.D., BCPS, AAHIVP []  Milus Glazier, Pharm.D. []  Stephens November, Pharm.Amedeo Gory PharmD  Positive urine  culture Treated with bactrim DS, organism sensitive to the same and no further patient follow-up is required at this time.  Hazle Nordmann 09/01/2016, 11:33 AM

## 2016-09-01 NOTE — Telephone Encounter (Signed)
Received message from pt stating that she received a notification that her test results we up. Im not sure if this is from when she saw [place] in our office or if she is referring to the ED results. Please advise.

## 2016-09-01 NOTE — Telephone Encounter (Signed)
Is she referring to the urine from the ED?   It does show an infection and the antibiotic they prescribed is effective against the bacteria.

## 2016-09-01 NOTE — Telephone Encounter (Signed)
Pt left message to make f/u appt in OCT, Returned pt call no answer left VM

## 2016-09-02 NOTE — Telephone Encounter (Signed)
Spoke with pt to clarify about results of labs done while in the ED.

## 2016-09-08 ENCOUNTER — Encounter: Payer: Self-pay | Admitting: Endocrinology

## 2016-09-08 ENCOUNTER — Ambulatory Visit (INDEPENDENT_AMBULATORY_CARE_PROVIDER_SITE_OTHER): Payer: Medicare Other | Admitting: Endocrinology

## 2016-09-08 VITALS — BP 132/84 | HR 86 | Ht 69.0 in | Wt 171.0 lb

## 2016-09-08 DIAGNOSIS — N183 Chronic kidney disease, stage 3 (moderate): Secondary | ICD-10-CM | POA: Diagnosis not present

## 2016-09-08 DIAGNOSIS — Z794 Long term (current) use of insulin: Secondary | ICD-10-CM

## 2016-09-08 DIAGNOSIS — E0590 Thyrotoxicosis, unspecified without thyrotoxic crisis or storm: Secondary | ICD-10-CM

## 2016-09-08 DIAGNOSIS — E1122 Type 2 diabetes mellitus with diabetic chronic kidney disease: Secondary | ICD-10-CM

## 2016-09-08 LAB — T4, FREE: Free T4: 0.79 ng/dL (ref 0.60–1.60)

## 2016-09-08 LAB — MICROALBUMIN / CREATININE URINE RATIO
CREATININE, U: 36.6 mg/dL
MICROALB UR: 1.6 mg/dL (ref 0.0–1.9)
Microalb Creat Ratio: 4.4 mg/g (ref 0.0–30.0)

## 2016-09-08 LAB — TSH: TSH: 2.3 u[IU]/mL (ref 0.35–4.50)

## 2016-09-08 LAB — POCT GLYCOSYLATED HEMOGLOBIN (HGB A1C): HEMOGLOBIN A1C: 7.7

## 2016-09-08 NOTE — Patient Instructions (Addendum)
check your blood sugar twice a day.  vary the time of day when you check, between before the 3 meals, and at bedtime.  also check if you have symptoms of your blood sugar being too high or too low.  please keep a record of the readings and bring it to your next appointment here.  please call us sooner if your blood sugar goes below 70, or if you have a lot of readings over 200.   If ever you have fever while taking methimazole, stop it and call us, even if the reason is obvious, because of the risk of a rare side-effect.  On this type of insulin schedule, you should eat meals on a regular schedule--especially lunch.  If a meal is missed or significantly delayed, your blood sugar could go low.   Please continue the same insulin: 40 units each morning.  However, if you are going to be active, take just 25 units.     Please come back for a follow-up appointment in 4 months.

## 2016-09-08 NOTE — Progress Notes (Signed)
Subjective:    Patient ID: [name], female    DOB: [birth year], 82 y.o.   MRN: [id number]  HPI Pt returns for f/u of diabetes mellitus:  DM type: Insulin-requiring type 2.   Dx'ed: [id number] Complications: CAD, polyneuropathy, and renal insufficiency.  Therapy: insulin since 2007.  GDM: never DKA: never Severe hypoglycemia: never.  Pancreatitis: never.  Other: she changed to qd insulin, after poor results with multiple daily injections; she was changed from lantus to QAM NPH, due to am hypoglycemia and PM hyperglycemia.  Interval history: she brings a record of her cbg's which i have reviewed today.  It varies from 76 up to the high-100's.  She says it is still mildly low fasting--this happens approx twice per month.   Pt also returns for f/u of hyperthyroidism (dx'ed 2012; [place] then showed multinodular goiter; tapazole was chose as rx, due to low uptake on nuc med scan).  Denies fever.  Past Medical History:  Diagnosis Date  . Anemia    iron defficiency  . Anginal pain ([place])   . Arthritis    "knees, feet, hands; joints" (05/27/2015)  . Asthma   . Atrial fibrillation or flutter    s/p RFCA 7/08;   s/p DCCV in past;   previously on amiodarone;  amio stopped due to lung toxicity  . CAD (coronary artery disease)    s/p NSTEMI tx with BMS to OM1 3/08;  cath 3/08: pOM 99% tx with PCI, pLAD 20%, ? mod stenosis at the AM  . Chronic diastolic heart failure ([place])    echo 11/11:  EF 55-60%, severe LVH, mod LAE, mild MR, mildly increased PASP  . COPD (chronic obstructive pulmonary disease) ([place])   . Degenerative joint disease   . Dystrophy, corneal stromal   . Heart murmur   . HLD (hyperlipidemia)   . HTN (hypertension)    essential nos  . Hypopotassemia    PMH of  . Muscle pain   . Myocardial infarction ([place]) 2008  . OSA on CPAP   . Osteoporosis   . Pneumonia 05/27/2015  . Protein calorie malnutrition ([place])   . Rash and nonspecific skin eruption    both arms,awaiting bio   dr  Delman Cheadle  . Seasonal allergies   . Shortness of breath dyspnea   . Type II diabetes mellitus ([place])    Dr Loanne Drilling    Past Surgical History:  Procedure Laterality Date  . A FLUTTER ABLATION     Dr Lovena Le  . CATARACT EXTRACTION W/ INTRAOCULAR LENS  IMPLANT, BILATERAL Bilateral   . CHOLECYSTECTOMY    . COLONOSCOPY  6/07   2 polyps Dr. Kinnie Feil in [place]  . colonoscopy with polypectomy      X 2; [facility]  . CORNEAL TRANSPLANT Bilateral   . CORONARY ANGIOPLASTY WITH STENT PLACEMENT  02/2007   BMS w/Dr Lovena Le  . EYE SURGERY    . gravid 2 para 2    . KNEE CARTILAGE SURGERY Right   . LEFT AND RIGHT HEART CATHETERIZATION WITH CORONARY ANGIOGRAM N/A 05/07/2014   Procedure: LEFT AND RIGHT HEART CATHETERIZATION WITH CORONARY ANGIOGRAM;  Surgeon: Peter M Martinique, MD;  Location[facility] C[facility];  Service: Cardiovascular;  Laterality: N/A;  . TOTAL KNEE ARTHROPLASTY Right 06/28/2016   Procedure: TOTAL KNEE ARTHROPLASTY;  Surgeon: Frederik Pear, MD;  Location: [facility];  Service: Orthopedics;  Laterality: Right;    Social History   Social History  . Marital status: Widowed    Spouse name:  N/A  . Number of children: N/A  . Years of education: N/A   Occupational History  . Teacher    Social History Main Topics  . Smoking status: Former Smoker    Packs/day: 0.25    Years: 18.00    Types: Cigarettes    Quit date: 12/27/1968  . Smokeless tobacco: Never Used     Comment: smoked [place], up to 1 pp week  . Alcohol use Yes     Comment: occ  . Drug use: No  . Sexual activity: Yes   Other Topics Concern  . Not on file   Social History Narrative   Teacher. Widowed. Rarely drinks cafeine.     Current Outpatient Prescriptions on File Prior to Visit  Medication Sig Dispense Refill  . apixaban (ELIQUIS) 2.5 MG TABS tablet Take 1 tablet (2.5 mg total) by mouth 2 (two) times daily. 60 tablet 0  . carvedilol (COREG) 12.5 MG tablet Take 1 tablet (12.5 mg total) by mouth 2 (two) times daily with a meal. 180  tablet 3  . diclofenac sodium (VOLTAREN) 1 % GEL Apply 2 g topically 2 (two) times daily as needed (for pain).    . furosemide (LASIX) 40 MG tablet Take 2 tablets (80 mg total) by mouth daily. 180 tablet 3  . glucose blood (ONE TOUCH TEST STRIPS) test strip Use to check blood sugar 2 times per day. Dx Code E11.9 (ONE TOUCH VERIO) 200 each 2  . insulin NPH Human (HUMULIN N,NOVOLIN N) 100 UNIT/ML injection Inject 40 Units into the skin daily.     . Lancets (ONETOUCH ULTRASOFT) lancets Use to check blood sugar 2 times per day. Dx Code E11.9 200 each 2  . methimazole (TAPAZOLE) 5 MG tablet Take 5 mg by mouth 2 (two) times a week. Wednesday and Friday.    . Multiple Vitamins-Minerals (CENTRUM SILVER ULTRA WOMENS PO) Take 1 tablet by mouth daily.     . nitroGLYCERIN (NITROSTAT) 0.4 MG SL tablet Place 0.4 mg under the tongue every 5 (five) minutes as needed for chest pain. Reported on 01/28/2016    . potassium chloride SA (K-DUR,KLOR-CON) 20 MEQ tablet Take 20-40 mEq by mouth daily. Take 1 tablet (18mEq) daily alternating with 2 tablets (23mEq) daily)    . tiotropium (SPIRIVA) 18 MCG inhalation capsule Place 1 capsule (18 mcg total) into inhaler and inhale daily. 90 capsule 2  . Vitamin D, Cholecalciferol, 1000 units TABS Take 2,000 Units by mouth daily.      No current facility-administered medications on file prior to visit.     Allergies  Allergen Reactions  . Amlodipine Besylate Other (See Comments)    REACTION: tingling in lips & gum edema  . Levaquin [Levofloxacin] Shortness Of Breath and Swelling    angioedema  . Lobster [Shellfish Allergy] Other (See Comments)    angioedema  . Penicillins Rash  . Valsartan Other (See Comments)    REACTION: angioedema  . Codeine Other (See Comments)    Mental status changes  . Oxycodone   . Tramadol Hcl Nausea And Vomiting    Family History  Problem Relation Age of Onset  . Diabetes Mother   . Hypertension Mother   . Transient ischemic attack  Mother   . Arthritis Mother   . Heart attack Father 52  . Arthritis Father   . Hypertension Father   . Breast cancer Maternal Aunt   . Arthritis Maternal Aunt     BP 132/84   Pulse 86  Ht 5\' 9"  (1.753 m)   Wt 171 lb (77.6 kg)   SpO2 93%   BMI 25.25 kg/m    Review of Systems Denies LOC.     Objective:   Physical Exam VITAL SIGNS:  See vs page GENERAL: no distress Neck: thyroid is minimally enlarged, with slightly irregular surface.   Pulses: dorsalis pedis intact bilat.   MSK: no deformity of the feet CV: no leg edema Skin:  no ulcer on the feet.  normal color and temp on the feet.  Neuro: sensation is intact to touch on the feet.    A1c=7.7% Lab Results  Component Value Date   CREATININE 0.98 08/29/2016   BUN 21 (H) 08/29/2016   NA 134 (L) 08/29/2016   K 3.8 08/29/2016   CL 102 08/29/2016   CO2 26 08/29/2016   Lab Results  Component Value Date   TSH 2.30 09/08/2016      Assessment & Plan:  Insulin-requiring type 2 DM: this is the best control this pt should aim for, given this regimen, which does match insulin to her changing needs throughout the day Hyperthyroidism: well-controlled

## 2016-09-09 NOTE — Telephone Encounter (Signed)
Patient has some question about a dx of Stage 3 kidney disease on her visit summary, please advise

## 2016-09-10 DIAGNOSIS — M25561 Pain in right knee: Secondary | ICD-10-CM | POA: Diagnosis not present

## 2016-09-10 DIAGNOSIS — M1711 Unilateral primary osteoarthritis, right knee: Secondary | ICD-10-CM | POA: Diagnosis not present

## 2016-09-14 ENCOUNTER — Telehealth: Payer: Self-pay | Admitting: Endocrinology

## 2016-09-14 DIAGNOSIS — M1711 Unilateral primary osteoarthritis, right knee: Secondary | ICD-10-CM | POA: Diagnosis not present

## 2016-09-14 NOTE — Telephone Encounter (Signed)
Pt called, stated she needed to talk to [facility], requests call back.

## 2016-09-15 ENCOUNTER — Other Ambulatory Visit (HOSPITAL_COMMUNITY): Payer: Self-pay | Admitting: Internal Medicine

## 2016-09-15 ENCOUNTER — Other Ambulatory Visit: Payer: Self-pay

## 2016-09-15 MED ORDER — METHIMAZOLE 5 MG PO TABS: 5.0000 mg | ORAL_TABLET | ORAL | 2 refills | Status: DC

## 2016-09-15 NOTE — Telephone Encounter (Signed)
I contacted patient. She wanted to verify why Chronic Kidney Disease Stage 3 was on her list of Diagnosis. I advised the patient based of the chart this diagnosis was added when she was in [place] back in 2016 and was placed on her chart by one of the providers at the Rehab facility. I went over with patient what CKD was and advised her to follow up with her PCP for further questions. Pateint voiced understanding and stated she had no further questions at this time.

## 2016-09-15 NOTE — Telephone Encounter (Signed)
Need refill of methimazole (TAPAZOLE) 5 MG tablet take twice a day.  [place], [place] [id number] [phone] (Phone) [phone] (Fax)

## 2016-09-15 NOTE — Telephone Encounter (Signed)
See message and please advise if ok to refill on the medication. The current list states she is taking twice a week and she is requesting a refill stating twice daily.

## 2016-10-11 ENCOUNTER — Ambulatory Visit (HOSPITAL_COMMUNITY)
Admission: RE | Admit: 2016-10-11 | Discharge: 2016-10-11 | Disposition: A | Discharge status: Home / Self Care | Payer: Medicare Other | Source: Ambulatory Visit | Attending: Internal Medicine | Admitting: Internal Medicine

## 2016-10-11 VITALS — BP 172/78 | HR 86 | Wt 175.1 lb

## 2016-10-11 DIAGNOSIS — Z96651 Presence of right artificial knee joint: Secondary | ICD-10-CM | POA: Insufficient documentation

## 2016-10-11 DIAGNOSIS — I2510 Atherosclerotic heart disease of native coronary artery without angina pectoris: Secondary | ICD-10-CM | POA: Diagnosis not present

## 2016-10-11 DIAGNOSIS — I272 Pulmonary hypertension, unspecified: Secondary | ICD-10-CM | POA: Insufficient documentation

## 2016-10-11 DIAGNOSIS — I110 Hypertensive heart disease with heart failure: Secondary | ICD-10-CM | POA: Insufficient documentation

## 2016-10-11 DIAGNOSIS — I5032 Chronic diastolic (congestive) heart failure: Secondary | ICD-10-CM | POA: Insufficient documentation

## 2016-10-11 DIAGNOSIS — Z7901 Long term (current) use of anticoagulants: Secondary | ICD-10-CM | POA: Diagnosis not present

## 2016-10-11 DIAGNOSIS — I482 Chronic atrial fibrillation, unspecified: Secondary | ICD-10-CM

## 2016-10-11 DIAGNOSIS — I4891 Unspecified atrial fibrillation: Secondary | ICD-10-CM | POA: Insufficient documentation

## 2016-10-11 DIAGNOSIS — I4892 Unspecified atrial flutter: Secondary | ICD-10-CM | POA: Diagnosis not present

## 2016-10-11 DIAGNOSIS — Z880 Allergy status to penicillin: Secondary | ICD-10-CM | POA: Insufficient documentation

## 2016-10-11 DIAGNOSIS — Z885 Allergy status to narcotic agent status: Secondary | ICD-10-CM | POA: Diagnosis not present

## 2016-10-11 DIAGNOSIS — Z79899 Other long term (current) drug therapy: Secondary | ICD-10-CM | POA: Insufficient documentation

## 2016-10-11 DIAGNOSIS — G4733 Obstructive sleep apnea (adult) (pediatric): Secondary | ICD-10-CM | POA: Diagnosis not present

## 2016-10-11 DIAGNOSIS — Z794 Long term (current) use of insulin: Secondary | ICD-10-CM | POA: Diagnosis not present

## 2016-10-11 DIAGNOSIS — Z888 Allergy status to other drugs, medicaments and biological substances status: Secondary | ICD-10-CM | POA: Insufficient documentation

## 2016-10-11 NOTE — Progress Notes (Signed)
Patient ID: [name], female   DOB: 1931/03/28, 82 y.o.   MRN: [id number]   ADVANCED HF CLINIC NOTE  Patient ID: [name], female   DOB: 09/24/1931, 82 y.o.   MRN: [id number]  Referring Physician: Lovena Le Primary Care: Billey Gosling Primary Cardiologist:Taylor  HPI: Mrs. Mantilla is a pleasant 82 yo woman with CAD, HTN, atrial fibrillation/A flutter and chronic prior systolic heart failure, (which was likely rate related) and diastolic heart failure. She was referred by Dr. Lovena Le for further evaluation of Pulmonary HTN.   Underwent 2D echo in 3/15 which showed EF 60-65% mild LVH. RV was normal. No significant valvular abnormalities.   Underwent R/L cath 05/07/14 Which showed stable CAD and significant PH  RA 6 RV 71/7 6 PA 63/17 (33)  PCWP 19 mm Hg  LV 174/16 mm Hg  AO 171/62 mean 103 mm Hg  PVR = 2.86 Oxygen saturations:  PA 69%  AO 99%  Cardiac Output/Index (Fick) 4.88/2.7    Left mainstem: Normal.  Left anterior descending (LAD): Moderate calcification proximally. Mild irregularities less than 10%. The first diagonal has 40-50% ostial disease.  Left circumflex (LCx): The LCx gives rise to a large OM1 then terminates in the AV groove. There is 20% disease in the proximal LCx. The first OM stent is patent with diffuse 20% disease.  Right coronary artery (RCA): The RCA arises anteriorly. There is an eccentric slit like stenosis in the proximal vessel to 60-70%. The mid vessel has segmental 70-80% disease.  Left ventriculography: Left ventricular systolic function is normal, LVEF is estimated at 55-65%, there is no significant mitral regurgitation   We saw her for an initial visit in May 80 for pHTN and DOE. At that time she was quite dyspneic with activity. HR was in 40-50 range on carvedilol 50 bid and digoxin. We stopped her digoxin and cut carvedilol back to 25 bid. PFTs, VQ and CXR were ordered. I also asked her to decrease her H2O intake. We saw her back last  month and she was feeling much better. Given results of PFTs overnight oximeter was placed and referred to Dr. Gwenette Greet. She has had overnight oximetry which showed oxygen desaturation as low as 83%, and spent 97 minutes less than 88% during the night.   Follow-up:  Here for routine f/u. Feeling ok. Had successful right knee replacement on 82 Post-op went to [facility] for rehab for 3 weeks. Then had HHPT. From cardiac standpoint. No edema, orthopnea or PND. Taking lasix 80mg  daily. No need to take extra. Back to [place] for water aerobics and elliptical - 3 days per week. No bleeding with Eliquis.   Results:  05/21/14: VQ normal 5/15: CXR: 3/16: Echo 3/16 EF 60-65% RV normal. Mild TR No RVSP measured   Emphysema. Otherwise normal.  5/15: PFTs FEV1  1.4 (95%) FVC    1.89 (110%) FEV/FVC 88% FEF 25-75%  66% DLCO 39%  BP rest: 138/58 BP peak: 182/66 Peak VO2: 8.9 (69.1% predicted peak VO2) VE/VCO2 slope: 34.6 OUES: Peak RER: 1.24 Ventilatory Threshold: 5.8 (50% predicted and 65% measured peak VO2) Peak RR 38 Peak Ventilation: 32.7 VE/MVV: 71%  PETCO2 at peak: 36 O2pulse: 8 (100% predicted O2pulse)     Labs  05/23/15  Creatinine 1.2 05/31/15    Creatinine 1.65 9/17  Creatinine 0.98 k 3.8    Current Outpatient Prescriptions  Medication Sig Dispense Refill  . apixaban (ELIQUIS) 2.5 MG TABS tablet Take 1 tablet (2.5 mg total) by  mouth 2 (two) times daily. 60 tablet 0  . carvedilol (COREG) 12.5 MG tablet Take 1 tablet (12.5 mg total) by mouth 2 (two) times daily with a meal. 180 tablet 3  . diclofenac sodium (VOLTAREN) 1 % GEL Apply 2 g topically 2 (two) times daily as needed (for pain).    . furosemide (LASIX) 40 MG tablet TAKE TWO TABLETS BY MOUTH ONCE DAILY 180 tablet 1  . glucose blood (ONE TOUCH TEST STRIPS) test strip Use to check blood sugar 2 times per day. Dx Code E11.9 (ONE TOUCH VERIO) 200 each 2  . insulin NPH Human (HUMULIN N,NOVOLIN N) 100 UNIT/ML injection  Inject 40 Units into the skin daily.     . Lancets (ONETOUCH ULTRASOFT) lancets Use to check blood sugar 2 times per day. Dx Code E11.9 200 each 2  . methimazole (TAPAZOLE) 5 MG tablet Take 1 tablet (5 mg total) by mouth 2 (two) times a week. Wednesday and Friday. 20 tablet 2  . Multiple Vitamins-Minerals (CENTRUM SILVER ULTRA WOMENS PO) Take 1 tablet by mouth daily.     . nitroGLYCERIN (NITROSTAT) 0.4 MG SL tablet Place 0.4 mg under the tongue every 5 (five) minutes as needed for chest pain. Reported on 01/28/2016    . potassium chloride SA (K-DUR,KLOR-CON) 20 MEQ tablet Take 20-40 mEq by mouth daily. Take 1 tablet (27mEq) daily alternating with 2 tablets (53mEq) daily)    . tiotropium (SPIRIVA) 18 MCG inhalation capsule Place 1 capsule (18 mcg total) into inhaler and inhale daily. 90 capsule 2  . Vitamin D, Cholecalciferol, 1000 units TABS Take 2,000 Units by mouth daily.      No current facility-administered medications for this encounter.     Allergies  Allergen Reactions  . Amlodipine Besylate Other (See Comments)    REACTION: tingling in lips & gum edema  . Levaquin [Levofloxacin] Shortness Of Breath and Swelling    angioedema  . Lobster [Shellfish Allergy] Other (See Comments)    angioedema  . Penicillins Rash  . Valsartan Other (See Comments)    REACTION: angioedema  . Codeine Other (See Comments)    Mental status changes  . Oxycodone   . Tramadol Hcl Nausea And Vomiting   PHYSICAL EXAM: Vitals:   10/11/16 1030  BP: (!) 172/78  BP Location: Left Arm  Pulse: 86  SpO2: 95%  Weight: 175 lb 1.9 oz (79.4 kg)    General:  Looks younger than stated age. Well appearing. No respiratory difficulty HEENT: normal Neck: supple. JVP 5-6 Carotids 2+ bilat; no bruits. No lymphadenopathy or thryomegaly appreciated. Cor: PMI nondisplaced.  Irregular rate & rhythm. No rubs, gallops or murmurs. Lungs: clear with diminished BS throughout. No wheeze Abdomen: soft, nontender, nondistended.  No hepatosplenomegaly. No bruits or masses. Good bowel sounds. Extremities: no cyanosis, clubbing, rash, no edema Neuro: alert & oriented x 3, cranial nerves grossly intact. moves all 4 extremities w/o difficulty. Affect pleasant.   ASSESSMENT & PLAN: 1) Mild pulmonary venous HTN  --improved with volume management 2) Chronic diastolic HF --volume stable. NYHA II 3) Persistent atrial fib/flutter    This patients CHA2DS2-VASc Score and unadjusted Ischemic Stroke Rate (% per year) is equal to 7.2 % stroke rate/year from a score of 5 --Doing well with rate control. Tolerating Eliquis 2.5 bid well  Above score calculated as 2 points each if present [Age > 75, or Stroke/TIA/TE] 4) Bradycardia - resolved 5) OSA 6) HTN - BP high today in setting of high-salt weekend and missing  meds this am. Has been well-controlled recently. No change for now. Will continue to follow home BPs.   Calayah Guadarrama,MD 9:16 PM

## 2016-10-11 NOTE — Patient Instructions (Signed)
We will contact you in 6 months to schedule your next appointment.  

## 2016-10-11 NOTE — Progress Notes (Signed)
Medication Samples have been provided to the patient.  Drug name: Eliquis        Strength: 5mg         Qty: 4  LOT: [id number]  Exp.Date: 9/19  Dosing instructions: 1 tab Twice daily   The patient has been instructed regarding the correct time, dose, and frequency of taking this medication, including desired effects and most common side effects.   [name] 11:16 AM 10/11/2016

## 2016-10-14 ENCOUNTER — Ambulatory Visit (INDEPENDENT_AMBULATORY_CARE_PROVIDER_SITE_OTHER): Payer: Medicare Other

## 2016-10-14 DIAGNOSIS — Z23 Encounter for immunization: Secondary | ICD-10-CM | POA: Diagnosis not present

## 2016-10-25 ENCOUNTER — Telehealth (HOSPITAL_COMMUNITY): Payer: Self-pay | Admitting: *Deleted

## 2016-10-25 MED ORDER — POTASSIUM CHLORIDE CRYS ER 20 MEQ PO TBCR: EXTENDED_RELEASE_TABLET | ORAL | 6 refills | Status: DC

## 2016-10-25 NOTE — Telephone Encounter (Signed)
Pt left a VM requesting new rx for klor-con, rx sent in

## 2016-10-27 ENCOUNTER — Telehealth: Payer: Self-pay | Admitting: Emergency Medicine

## 2016-10-27 DIAGNOSIS — E785 Hyperlipidemia, unspecified: Secondary | ICD-10-CM

## 2016-10-27 MED ORDER — ATORVASTATIN CALCIUM 20 MG PO TABS: 20.0000 mg | ORAL_TABLET | Freq: Every day | ORAL | 1 refills | Status: DC

## 2016-10-27 NOTE — Telephone Encounter (Signed)
Received call from pt, states that she has taken a statin in the past and could not handle it, it made her too weak, please advise.

## 2016-10-27 NOTE — Telephone Encounter (Signed)
Per review of letter that pt received from [facility], per Dr Quay Burow Lipitor 20 mg has been sent to pts POF and pt has been notified, Orders placed for labs in 3 months

## 2016-10-27 NOTE — Telephone Encounter (Signed)
Ok - do not start statin - we will discuss at her next visit.

## 2016-10-28 NOTE — Telephone Encounter (Signed)
Pt's son advised in detail and understood

## 2016-12-15 DIAGNOSIS — M1712 Unilateral primary osteoarthritis, left knee: Secondary | ICD-10-CM | POA: Diagnosis not present

## 2017-01-13 ENCOUNTER — Other Ambulatory Visit: Payer: Self-pay | Admitting: Internal Medicine

## 2017-01-13 DIAGNOSIS — Z1231 Encounter for screening mammogram for malignant neoplasm of breast: Secondary | ICD-10-CM

## 2017-01-24 ENCOUNTER — Ambulatory Visit (INDEPENDENT_AMBULATORY_CARE_PROVIDER_SITE_OTHER): Payer: Medicare Other | Admitting: Endocrinology

## 2017-01-24 ENCOUNTER — Encounter: Payer: Self-pay | Admitting: Endocrinology

## 2017-01-24 VITALS — BP 142/84 | HR 82 | Ht 69.0 in | Wt 178.0 lb

## 2017-01-24 DIAGNOSIS — E1122 Type 2 diabetes mellitus with diabetic chronic kidney disease: Secondary | ICD-10-CM

## 2017-01-24 DIAGNOSIS — N183 Chronic kidney disease, stage 3 unspecified: Secondary | ICD-10-CM

## 2017-01-24 DIAGNOSIS — E0590 Thyrotoxicosis, unspecified without thyrotoxic crisis or storm: Secondary | ICD-10-CM

## 2017-01-24 DIAGNOSIS — Z794 Long term (current) use of insulin: Secondary | ICD-10-CM | POA: Diagnosis not present

## 2017-01-24 LAB — POCT GLYCOSYLATED HEMOGLOBIN (HGB A1C): HEMOGLOBIN A1C: 8.2

## 2017-01-24 MED ORDER — INSULIN NPH (HUMAN) (ISOPHANE) 100 UNIT/ML ~~LOC~~ SUSP: 42.0000 [IU] | Freq: Every day | SUBCUTANEOUS | 11 refills | Status: DC

## 2017-01-24 NOTE — Progress Notes (Signed)
Subjective:    Patient ID: [name], female    DOB: 1931-02-08, 83 y.o.   MRN: [id number]  HPI Pt returns for f/u of diabetes mellitus:  DM type: Insulin-requiring type 2.   Dx'ed: [id number] Complications: CAD, polyneuropathy, and renal insufficiency.  Therapy: insulin since 2007.  GDM: never DKA: never Severe hypoglycemia: never.  Pancreatitis: never.  Other: she changed to qd insulin, after poor results with multiple daily injections; she was changed from lantus to QAM NPH, due to am hypoglycemia and PM hyperglycemia.  Interval history: no cbg record, but states cbg's are well-controlled.  It is in general higher as the day goes on.  She seldom has hypoglycemia, and these episodes are mild.  This happens fasting. Pt also returns for f/u of hyperthyroidism (dx'ed 2012; [place] then showed multinodular goiter; tapazole was chosen as rx, due to low uptake on nuc med scan).  Denies fever.   Past Medical History:  Diagnosis Date  . Anemia    iron defficiency  . Anginal pain ([place])   . Arthritis    "knees, feet, hands; joints" (05/27/2015)  . Asthma   . Atrial fibrillation or flutter    s/p RFCA 7/08;   s/p DCCV in past;   previously on amiodarone;  amio stopped due to lung toxicity  . CAD (coronary artery disease)    s/p NSTEMI tx with BMS to OM1 3/08;  cath 3/08: pOM 99% tx with PCI, pLAD 20%, ? mod stenosis at the AM  . Chronic diastolic heart failure ([place])    echo 11/11:  EF 55-60%, severe LVH, mod LAE, mild MR, mildly increased PASP  . COPD (chronic obstructive pulmonary disease) ([place])   . Degenerative joint disease   . Dystrophy, corneal stromal   . Heart murmur   . HLD (hyperlipidemia)   . HTN (hypertension)    essential nos  . Hypopotassemia    PMH of  . Muscle pain   . Myocardial infarction 2008  . OSA on CPAP   . Osteoporosis   . Pneumonia 05/27/2015  . Protein calorie malnutrition ([place])   . Rash and nonspecific skin eruption    both arms,awaiting bio   dr Delman Cheadle    . Seasonal allergies   . Shortness of breath dyspnea   . Type II diabetes mellitus ([place])    Dr Loanne Drilling    Past Surgical History:  Procedure Laterality Date  . A FLUTTER ABLATION     Dr Lovena Le  . CATARACT EXTRACTION W/ INTRAOCULAR LENS  IMPLANT, BILATERAL Bilateral   . CHOLECYSTECTOMY    . COLONOSCOPY  6/07   2 polyps Dr. Kinnie Feil in [place]  . colonoscopy with polypectomy      X 2; [facility]  . CORNEAL TRANSPLANT Bilateral   . CORONARY ANGIOPLASTY WITH STENT PLACEMENT  02/2007   BMS w/Dr Lovena Le  . EYE SURGERY    . gravid 2 para 2    . KNEE CARTILAGE SURGERY Right   . LEFT AND RIGHT HEART CATHETERIZATION WITH CORONARY ANGIOGRAM N/A 05/07/2014   Procedure: LEFT AND RIGHT HEART CATHETERIZATION WITH CORONARY ANGIOGRAM;  Surgeon: Peter M Martinique, MD;  Location: [facility] CATH LAB;  Service: Cardiovascular;  Laterality: N/A;  . TOTAL KNEE ARTHROPLASTY Right 06/28/2016   Procedure: TOTAL KNEE ARTHROPLASTY;  Surgeon: Frederik Pear, MD;  Location: [facility];  Service: Orthopedics;  Laterality: Right;    Social History   Social History  . Marital status: Widowed    Spouse name: N/A  .  Number of children: N/A  . Years of education: N/A   Occupational History  . Teacher    Social History Main Topics  . Smoking status: Former Smoker    Packs/day: 0.25    Years: 18.00    Types: Cigarettes    Quit date: 12/27/1968  . Smokeless tobacco: Never Used     Comment: smoked [place], up to 1 pp week  . Alcohol use Yes     Comment: occ  . Drug use: No  . Sexual activity: Yes   Other Topics Concern  . Not on file   Social History Narrative   Teacher. Widowed. Rarely drinks cafeine.     Current Outpatient Prescriptions on File Prior to Visit  Medication Sig Dispense Refill  . apixaban (ELIQUIS) 2.5 MG TABS tablet Take 1 tablet (2.5 mg total) by mouth 2 (two) times daily. 60 tablet 0  . carvedilol (COREG) 12.5 MG tablet Take 1 tablet (12.5 mg total) by mouth 2 (two) times daily with a meal. 180 tablet 3   . diclofenac sodium (VOLTAREN) 1 % GEL Apply 2 g topically 2 (two) times daily as needed (for pain).    . furosemide (LASIX) 40 MG tablet TAKE TWO TABLETS BY MOUTH ONCE DAILY 180 tablet 1  . glucose blood (ONE TOUCH TEST STRIPS) test strip Use to check blood sugar 2 times per day. Dx Code E11.9 (ONE TOUCH VERIO) 200 each 2  . Lancets (ONETOUCH ULTRASOFT) lancets Use to check blood sugar 2 times per day. Dx Code E11.9 200 each 2  . methimazole (TAPAZOLE) 5 MG tablet Take 1 tablet (5 mg total) by mouth 2 (two) times a week. Wednesday and Friday. 20 tablet 2  . Multiple Vitamins-Minerals (CENTRUM SILVER ULTRA WOMENS PO) Take 1 tablet by mouth daily.     . nitroGLYCERIN (NITROSTAT) 0.4 MG SL tablet Place 0.4 mg under the tongue every 5 (five) minutes as needed for chest pain. Reported on 01/28/2016    . potassium chloride SA (K-DUR,KLOR-CON) 20 MEQ tablet Take 1 tablet (1mEq) daily every other day alternating with 2 tablets (86mEq) daily every other day 45 tablet 6  . Vitamin D, Cholecalciferol, 1000 units TABS Take 2,000 Units by mouth daily.     Marland Kitchen atorvastatin (LIPITOR) 20 MG tablet Take 1 tablet (20 mg total) by mouth daily. (Patient not taking: Reported on 01/24/2017) 90 tablet 1  . tiotropium (SPIRIVA) 18 MCG inhalation capsule Place 1 capsule (18 mcg total) into inhaler and inhale daily. (Patient not taking: Reported on 01/24/2017) 90 capsule 2   No current facility-administered medications on file prior to visit.     Allergies  Allergen Reactions  . Amlodipine Besylate Other (See Comments)    REACTION: tingling in lips & gum edema  . Levaquin [Levofloxacin] Shortness Of Breath and Swelling    angioedema  . Lobster [Shellfish Allergy] Other (See Comments)    angioedema  . Penicillins Rash  . Valsartan Other (See Comments)    REACTION: angioedema  . Codeine Other (See Comments)    Mental status changes  . Oxycodone   . Statins     Made her too weak  . Tramadol Hcl Nausea And Vomiting     Family History  Problem Relation Age of Onset  . Diabetes Mother   . Hypertension Mother   . Transient ischemic attack Mother   . Arthritis Mother   . Heart attack Father 26  . Arthritis Father   . Hypertension Father   .  Breast cancer Maternal Aunt   . Arthritis Maternal Aunt     BP (!) 142/84   Pulse 82   Ht 5\' 9"  (1.753 m)   Wt 178 lb (80.7 kg)   SpO2 95%   BMI 26.29 kg/m    Review of Systems She denies LOC    Objective:   Physical Exam VITAL SIGNS:  See vs page GENERAL: no distress Pulses: dorsalis pedis intact bilat.   MSK: no deformity of the feet CV: no leg edema Skin:  no ulcer on the feet.  normal color and temp on the feet. Neuro: sensation is intact to touch on the feet.    Lab Results  Component Value Date   HGBA1C 8.2 01/24/2017      Assessment & Plan:  Insulin-requiring type 2 DM, with renal insuff: worse Hyperthyroidism: due for recheck Patient is advised the following: Patient Instructions  check your blood sugar twice a day.  vary the time of day when you check, between before the 3 meals, and at bedtime.  also check if you have symptoms of your blood sugar being too high or too low.  please keep a record of the readings and bring it to your next appointment here.  please call us sooner if your blood sugar goes below 70, or if you have a lot of readings over 200.     If ever you have fever while taking methimazole, stop it and call us, even if the reason is obvious, because of the risk of a rare side-effect.  On this type of insulin schedule, you should eat meals on a regular schedule--especially lunch.  If a meal is missed or significantly delayed, your blood sugar could go low.  Please increase the insulin to 42 units each morning.  However, if you are going to be active, take just 25 units.   Please come back for a follow-up appointment in 4 months.   I have requested for your thyroid blood test to be checked on Thursday.

## 2017-01-24 NOTE — Patient Instructions (Addendum)
check your blood sugar twice a day.  vary the time of day when you check, between before the 3 meals, and at bedtime.  also check if you have symptoms of your blood sugar being too high or too low.  please keep a record of the readings and bring it to your next appointment here.  please call us sooner if your blood sugar goes below 70, or if you have a lot of readings over 200.     If ever you have fever while taking methimazole, stop it and call us, even if the reason is obvious, because of the risk of a rare side-effect.  On this type of insulin schedule, you should eat meals on a regular schedule--especially lunch.  If a meal is missed or significantly delayed, your blood sugar could go low.  Please increase the insulin to 42 units each morning.  However, if you are going to be active, take just 25 units.   Please come back for a follow-up appointment in 4 months.   I have requested for your thyroid blood test to be checked on Thursday.

## 2017-01-25 ENCOUNTER — Telehealth: Payer: Self-pay | Admitting: Endocrinology

## 2017-01-25 NOTE — Telephone Encounter (Signed)
Pt is completely out of there test strips can we please call them into [facility] today thank you!

## 2017-01-25 NOTE — Telephone Encounter (Signed)
Refill submitted. 

## 2017-01-26 NOTE — Telephone Encounter (Signed)
Noted  

## 2017-01-26 NOTE — Telephone Encounter (Signed)
Patient stated her insurance is charging her $219, and that is to expensive, so [employer] is helping her, they have to verify her as diabetic patient in order for her to get her test strip for free.    ONE TOUCH ULTRA TEST test strip   [phone]  They will be calling, FYI

## 2017-01-27 ENCOUNTER — Ambulatory Visit (INDEPENDENT_AMBULATORY_CARE_PROVIDER_SITE_OTHER): Payer: Medicare Other | Admitting: Internal Medicine

## 2017-01-27 ENCOUNTER — Encounter: Payer: Self-pay | Admitting: Internal Medicine

## 2017-01-27 ENCOUNTER — Other Ambulatory Visit (INDEPENDENT_AMBULATORY_CARE_PROVIDER_SITE_OTHER): Payer: Medicare Other

## 2017-01-27 VITALS — BP 162/90 | HR 76 | Temp 97.6°F | Resp 16 | Wt 176.0 lb

## 2017-01-27 DIAGNOSIS — Z794 Long term (current) use of insulin: Secondary | ICD-10-CM

## 2017-01-27 DIAGNOSIS — J432 Centrilobular emphysema: Secondary | ICD-10-CM | POA: Diagnosis not present

## 2017-01-27 DIAGNOSIS — I10 Essential (primary) hypertension: Secondary | ICD-10-CM | POA: Diagnosis not present

## 2017-01-27 DIAGNOSIS — N183 Chronic kidney disease, stage 3 (moderate): Secondary | ICD-10-CM

## 2017-01-27 DIAGNOSIS — G4733 Obstructive sleep apnea (adult) (pediatric): Secondary | ICD-10-CM

## 2017-01-27 DIAGNOSIS — E0590 Thyrotoxicosis, unspecified without thyrotoxic crisis or storm: Secondary | ICD-10-CM

## 2017-01-27 DIAGNOSIS — R21 Rash and other nonspecific skin eruption: Secondary | ICD-10-CM | POA: Insufficient documentation

## 2017-01-27 DIAGNOSIS — E1122 Type 2 diabetes mellitus with diabetic chronic kidney disease: Secondary | ICD-10-CM | POA: Diagnosis not present

## 2017-01-27 DIAGNOSIS — M5416 Radiculopathy, lumbar region: Secondary | ICD-10-CM | POA: Insufficient documentation

## 2017-01-27 DIAGNOSIS — J449 Chronic obstructive pulmonary disease, unspecified: Secondary | ICD-10-CM | POA: Insufficient documentation

## 2017-01-27 DIAGNOSIS — Z9989 Dependence on other enabling machines and devices: Secondary | ICD-10-CM

## 2017-01-27 LAB — COMPREHENSIVE METABOLIC PANEL
ALBUMIN: 4.1 g/dL (ref 3.5–5.2)
ALK PHOS: 99 U/L (ref 39–117)
ALT: 19 U/L (ref 0–35)
AST: 9 U/L (ref 0–37)
BILIRUBIN TOTAL: 0.5 mg/dL (ref 0.2–1.2)
BUN: 22 mg/dL (ref 6–23)
CALCIUM: 10.3 mg/dL (ref 8.4–10.5)
CO2: 35 meq/L — AB (ref 19–32)
CREATININE: 1.17 mg/dL (ref 0.40–1.20)
Chloride: 98 mEq/L (ref 96–112)
GFR: 56.78 mL/min — AB (ref >60.00)
Glucose, Bld: 150 mg/dL — ABNORMAL HIGH (ref 70–99)
Potassium: 4.2 mEq/L (ref 3.5–5.1)
Sodium: 140 mEq/L (ref 135–145)
TOTAL PROTEIN: 8.1 g/dL (ref 6.0–8.3)

## 2017-01-27 LAB — TSH: TSH: 1.83 u[IU]/mL (ref 0.35–4.50)

## 2017-01-27 LAB — T4, FREE: FREE T4: 0.81 ng/dL (ref 0.60–1.60)

## 2017-01-27 MED ORDER — GABAPENTIN 100 MG PO CAPS: 100.0000 mg | ORAL_CAPSULE | Freq: Every day | ORAL | 3 refills | Status: DC

## 2017-01-27 MED ORDER — UMECLIDINIUM-VILANTEROL 62.5-25 MCG/INH IN AEPB: 1.0000 | INHALATION_SPRAY | Freq: Every day | RESPIRATORY_TRACT | 5 refills | Status: DC

## 2017-01-27 MED ORDER — CLOTRIMAZOLE 1 % EX CREA: 1.0000 "application " | TOPICAL_CREAM | Freq: Two times a day (BID) | CUTANEOUS | 0 refills | Status: DC

## 2017-01-27 NOTE — Assessment & Plan Note (Signed)
Mild in nature Experiences shortness of breath with exertion Was taking Spiriva and found it helpful-it is too expensive for her We'll try a different inhaler - Anoro - not sure of coverage Can discuss with pulmonary as well

## 2017-01-27 NOTE — Assessment & Plan Note (Signed)
Has not been using CPAP-the mask is not comfortable She does have severe sleep apnea Review consequences of not using CPAP Advised her to follow-up with pulmonary to see if a different mask or option would help

## 2017-01-27 NOTE — Patient Instructions (Addendum)
Stat monitoring your bp at home regularly.   Follow up with pulmonary.   Test(s) ordered today. Your results will be released to [place] (or called to you) after review, usually within 72hours after test completion. If any changes need to be made, you will be notified at that same time.   Medications reviewed and updated.  Changes include  Starting gabapentin 100 mg at night - increase to 200 mg at night if tolerated.  Call if no improvement.  Consider PT.   Your prescription(s) have been submitted to your pharmacy. Please take as directed and contact our office if you believe you are having problem(s) with the medication(s).    Please followup in 6 months

## 2017-01-27 NOTE — Progress Notes (Signed)
Pre visit review using our clinic review tool, if applicable. No additional management support is needed unless otherwise documented below in the visit note. 

## 2017-01-27 NOTE — Assessment & Plan Note (Signed)
Bilateral inner thighs only, not itchy Present for 2 months Waxes and wanes in severity Possible yeast infection given area We will try an antifungal cream Call if no improvement

## 2017-01-27 NOTE — Assessment & Plan Note (Signed)
Following with Dr. Loanne Drilling Due for blood work-free T4, TSH Will forward results to him for further management

## 2017-01-27 NOTE — Assessment & Plan Note (Signed)
Blood pressure elevated here today She does monitor at home, but not regularly She will start monitoring her blood pressure on a regular basis Into new current medications Check TSH, CMP

## 2017-01-27 NOTE — Assessment & Plan Note (Addendum)
Left side, started one week ago Discussed treatment options Can continue some Advil, but advised to limit due to being on an oral anticoagulant. Take Tylenol whenever possible Start gabapentin 100 mg at night. If tolerated increase to 200 mg at night. Declined physical therapy at this time, but if there is no improvement she will consider this She will start home back exercises Consider referral to Dr. Tamala Julian

## 2017-01-27 NOTE — Assessment & Plan Note (Signed)
Management per Dr. Ellison 

## 2017-01-27 NOTE — Progress Notes (Signed)
Subjective:    Patient ID: [name], female    DOB: May 12, 1931, 83 y.o.   MRN: [id number]  HPI The patient is here for follow up.  COPD, mild: She has had prior pulmonary function tests that showed mild COPD. She has been using Spiriva daily and she feels it did help with some mild shortness of breath with exertion. Inhalers too expensive and she wonders if there is an alternative.  Diabetes: She sees Dr Loanne Drilling.  She is taking her medication daily as prescribed. She is compliant with a diabetic diet. She checks her feet daily and denies foot lesions. She is up-to-date with an ophthalmology examination.   Hypertension: She is taking her medication daily. She is compliant with a low sodium diet.  She denies chest pain, palpitations, edema, and regular headaches. She is exercising regularly.  She does not monitor her blood pressure at home.    Hyperlipidemia: She did not tolerate lipitor - it made her too weak. She is compliant with a low fat/cholesterol diet. She is exercising regularly. She denies myalgias.   Rash: Started two months ago.  It does not itch.  It flares up.  It very red.  It is only in the inner thighs.  She denies a similar rash elsewhere.  Hip pain:  She has a knot in her left buttoks.  She has pain down her left leg.  The leg gives out.  The leg is burning.  She has no pain when she is sitting.  The pain started one week ago. She has taken Advil with minimal improvement in her pain.     Numbness in left pinky:  She continues to experience numbness in her left fifth. Her fourth left finger is starting to become slightly as well. She wonders if this is related to her left leg pain. We did discuss at her last visit an EMG or neurology referral and she declined at that time.    Medications and allergies reviewed with patient and updated if appropriate.  Patient Active Problem List   Diagnosis Date Noted  . COPD (chronic obstructive pulmonary disease) ([place])  01/27/2017  . Granuloma annulare 07/27/2016  . Numbness of fingers 07/27/2016  . Primary osteoarthritis of right knee 06/27/2016  . Diabetes ([place]) 03/07/2016  . Acute renal failure superimposed on stage 3 chronic kidney disease ([place])   . Sepsis ([place]) 05/24/2015  . OSA on CPAP 08/28/2014  . Chronic diastolic heart failure ([place]) 07/30/2014  . Nocturnal hypoxemia 07/09/2014  . Dyspnea 05/15/2014  . Pulmonary HTN 05/15/2014  . Crescendo angina ([place]) 05/02/2014  . Band keratopathy 01/16/2014  . 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 Epiphora due to insufficient drainage 06/13/2013  . Hyperthyroidism 11/04/2011  . Multinodular goiter 10/14/2011  . Atrophy, Fuchs' 09/29/2011  . LUMBAR RADICULOPATHY, RIGHT 09/23/2010  . ATRIAL FIBRILLATION 06/03/2010  . CHF 02/17/2010  . VITILIGO 11/07/2009  . SINOATRIAL NODE DYSFUNCTION 06/16/2009  . Iron deficiency anemia 01/02/2009  . ANEMIA, PERNICIOUS 11/20/2008  . Atrial flutter ([place]) 06/25/2008  . HYPERLIPIDEMIA 01/29/2008  . Coronary atherosclerosis 01/29/2008  . HTN (hypertension) 10/30/2007    Current Outpatient Prescriptions on File Prior to Visit  Medication Sig Dispense Refill  . apixaban (ELIQUIS) 2.5 MG TABS tablet Take 1 tablet (2.5 mg total) by mouth 2 (two) times daily. 60 tablet 0  . carvedilol (COREG) 12.5 MG tablet Take 1 tablet (12.5 mg total) by mouth 2 (two) times daily with a meal. 180 tablet 3  .  diclofenac sodium (VOLTAREN) 1 % GEL Apply 2 g topically 2 (two) times daily as needed (for pain).    . furosemide (LASIX) 40 MG tablet TAKE TWO TABLETS BY MOUTH ONCE DAILY 180 tablet 1  . insulin NPH Human (HUMULIN N,NOVOLIN N) 100 UNIT/ML injection Inject 0.42 mLs (42 Units total) into the skin daily. 20 mL 11  . Lancets (ONETOUCH ULTRASOFT) lancets Use to check blood sugar 2 times per day. Dx Code E11.9 200 each 2  . methimazole (TAPAZOLE) 5 MG tablet Take 1 tablet (5 mg total) by mouth 2 (two) times a week. Wednesday and  Friday. 20 tablet 2  . Multiple Vitamins-Minerals (CENTRUM SILVER ULTRA WOMENS PO) Take 1 tablet by mouth daily.     . nitroGLYCERIN (NITROSTAT) 0.4 MG SL tablet Place 0.4 mg under the tongue every 5 (five) minutes as needed for chest pain. Reported on 01/28/2016    . ONE TOUCH ULTRA TEST test strip USE TO CHECK BLOOD SUGAR 2 TIMES PER DAY 150 each 3  . potassium chloride SA (K-DUR,KLOR-CON) 20 MEQ tablet Take 1 tablet (29mEq) daily every other day alternating with 2 tablets (47mEq) daily every other day 45 tablet 6  . Vitamin D, Cholecalciferol, 1000 units TABS Take 2,000 Units by mouth daily.      No current facility-administered medications on file prior to visit.     Past Medical History:  Diagnosis Date  . Anemia    iron defficiency  . Anginal pain ([place])   . Arthritis    "knees, feet, hands; joints" (05/27/2015)  . Asthma   . Atrial fibrillation or flutter    s/p RFCA 7/08;   s/p DCCV in past;   previously on amiodarone;  amio stopped due to lung toxicity  . CAD (coronary artery disease)    s/p NSTEMI tx with BMS to OM1 3/08;  cath 3/08: pOM 99% tx with PCI, pLAD 20%, ? mod stenosis at the AM  . CAP (community acquired pneumonia) 05/24/2015  . Chronic diastolic heart failure ([facility])    echo 11/11:  EF 55-60%, severe LVH, mod LAE, mild MR, mildly increased PASP  . COPD (chronic obstructive pulmonary disease) ([place])   . Degenerative joint disease   . Dystrophy, corneal stromal   . Heart murmur   . HLD (hyperlipidemia)   . HTN (hypertension)    essential nos  . Hypopotassemia    PMH of  . Muscle pain   . Myocardial infarction 2008  . OSA on CPAP   . Osteoporosis   . Pneumonia 05/27/2015  . Protein calorie malnutrition ([place])   . Rash and nonspecific skin eruption    both arms,awaiting bio   dr Delman Cheadle  . Seasonal allergies   . Shortness of breath dyspnea   . Type II diabetes mellitus ([place])    Dr Loanne Drilling    Past Surgical History:  Procedure Laterality Date  . A FLUTTER  ABLATION     Dr Lovena Le  . CATARACT EXTRACTION W/ INTRAOCULAR LENS  IMPLANT, BILATERAL Bilateral   . CHOLECYSTECTOMY    . COLONOSCOPY  6/07   2 polyps Dr. Kinnie Feil in [place]  . colonoscopy with polypectomy      X 2; [facility]  . CORNEAL TRANSPLANT Bilateral   . CORONARY ANGIOPLASTY WITH STENT PLACEMENT  02/2007   BMS w/Dr Lovena Le  . EYE SURGERY    . gravid 2 para 2    . KNEE CARTILAGE SURGERY Right   . LEFT AND RIGHT HEART CATHETERIZATION WITH  CORONARY ANGIOGRAM N/A 05/07/2014   Procedure: LEFT AND RIGHT HEART CATHETERIZATION WITH CORONARY ANGIOGRAM;  Surgeon: Peter M Martinique, MD;  Location: [facility] CATH LAB;  Service: Cardiovascular;  Laterality: N/A;  . TOTAL KNEE ARTHROPLASTY Right 06/28/2016   Procedure: TOTAL KNEE ARTHROPLASTY;  Surgeon: Frederik Pear, MD;  Location: [facility];  Service: Orthopedics;  Laterality: Right;    Social History   Social History  . Marital status: Widowed    Spouse name: N/A  . Number of children: N/A  . Years of education: N/A   Occupational History  . Teacher    Social History Main Topics  . Smoking status: Former Smoker    Packs/day: 0.25    Years: 18.00    Types: Cigarettes    Quit date: 12/27/1968  . Smokeless tobacco: Never Used     Comment: smoked [place], up to 1 pp week  . Alcohol use Yes     Comment: occ  . Drug use: No  . Sexual activity: Yes   Other Topics Concern  . Not on file   Social History Narrative   Teacher. Widowed. Rarely drinks cafeine.     Family History  Problem Relation Age of Onset  . Diabetes Mother   . Hypertension Mother   . Transient ischemic attack Mother   . Arthritis Mother   . Heart attack Father 106  . Arthritis Father   . Hypertension Father   . Breast cancer Maternal Aunt   . Arthritis Maternal Aunt     Review of Systems  Constitutional: Negative for fever.  Respiratory: Positive for shortness of breath (with activity). Negative for cough and wheezing.   Cardiovascular: Negative for chest pain,  palpitations and leg swelling.  Neurological: Positive for weakness. Negative for light-headedness, numbness and headaches.       Objective:   Vitals:   01/27/17 1050  BP: (!) 162/90  Pulse: 76  Resp: 16  Temp: 97.6 F (36.4 C)   Wt Readings from Last 3 Encounters:  01/27/17 176 lb (79.8 kg)  01/24/17 178 lb (80.7 kg)  10/11/16 175 lb 1.9 oz (79.4 kg)   Body mass index is 25.99 kg/m.   Physical Exam    Constitutional: Appears well-developed and well-nourished. No distress.  HENT:  Head: Normocephalic and atraumatic.  Neck: Neck supple. No tracheal deviation present. No thyromegaly present.  No cervical lymphadenopathy Cardiovascular: Normal rate, regular rhythm and normal heart sounds.   No murmur heard. No carotid bruit .  No edema Pulmonary/Chest: Effort normal and breath sounds normal. No respiratory distress. No has no wheezes. No rales.  Skin: Skin is warm and dry. Not diaphoretic.  Psychiatric: Normal mood and affect. Behavior is normal.      Assessment & Plan:    See Problem List for Assessment and Plan of chronic medical problems.   FU in 6 months

## 2017-02-01 ENCOUNTER — Telehealth: Payer: Self-pay | Admitting: Endocrinology

## 2017-02-01 NOTE — Telephone Encounter (Signed)
[employer] [facility] calling for a request for  patient diabetic supplys 90 day supply. Need verbal order

## 2017-02-02 ENCOUNTER — Telehealth: Payer: Self-pay | Admitting: Pulmonary Disease

## 2017-02-02 DIAGNOSIS — G4733 Obstructive sleep apnea (adult) (pediatric): Secondary | ICD-10-CM

## 2017-02-02 NOTE — Telephone Encounter (Signed)
Spoke with pt, who states she hasn't worn her cpap since her knee replacement in July of 2017. Pt wanted to know if [facility] would like for her to resume using is, as she has not had any issues with sleeping.   VS please advise. Thanks.

## 2017-02-02 NOTE — Telephone Encounter (Signed)
Supplies requested from [employer].

## 2017-02-03 NOTE — Telephone Encounter (Signed)
Spoke with pt. She is aware of [facility] response. Pt would like to proceed with home sleep study. Order has been placed. Nothing further was needed at this time.

## 2017-02-03 NOTE — Telephone Encounter (Signed)
She has severe sleep apnea based on sleep study from 2015.  Untreated sleep apnea can negatively impact her atrial fibrillation, coronary artery disease, hypertension, and diabetes.  She might not have trouble with her sleep w/o CPAP, but could still have adverse health consequences from untreated sleep apnea.  She should have repeat home sleep study to determine current status of sleep apnea before I would be comfortable advising she doesn't need to use CPAP anymore.

## 2017-02-04 NOTE — Progress Notes (Addendum)
Subjective:   [name] is a 83 y.o. female who presents for Medicare Annual (Subsequent) preventive examination.  Patient states the rash around her groin has not improved with the lotrimin cream prescribed 01/27/17  Patient states she is taking gabapentin 200mg  at bedtime, this has not helped left hip pain. She is also taking Advil as needed to help with the pain.   Review of Systems:  No ROS.  Medicare Wellness Visit.  Cardiac Risk Factors include: advanced age (>42men, >52 women);diabetes mellitus;dyslipidemia;hypertension Sleep patterns: has difficulty falling asleep, feels rested on waking, does not get up to void and sleeps 7-8 hours nightly.  c-pap Home Safety/Smoke Alarms: Feels safe in home. Smoke alarms in place.   Living environment; residence and Firearm Safety: 1-story house/ trailer, apartment, firearms stored safely. Lives with son Seat Belt Safety/Bike Helmet: Wears seat belt.   Counseling:   Eye Exam- last, goes yearly Dental- Last, partial has appointment in 2 weeks.  Female:   Pap-  N/A aged out      Mammo- Last 02/16/16,  BI-RADS CATEGORY 1: Negative. Has an appointment  02/16/17 Dexa scan-   None      CCS- N/A aged out      Objective:     Vitals: BP (!) 138/55   Pulse 66   Resp (!) 21   Ht 5\' 9"  (1.753 m)   Wt 182 lb (82.6 kg)   SpO2 98%   BMI 26.88 kg/m   Body mass index is 26.88 kg/m.   Tobacco History  Smoking Status  . Former Smoker  . Packs/day: 0.25  . Years: 18.00  . Types: Cigarettes  . Quit date: 12/27/1968  Smokeless Tobacco  . Never Used    Comment: smoked [place], up to 1 pp week     Counseling given: Not Answered   Past Medical History:  Diagnosis Date  . Anemia    iron defficiency  . Anginal pain ([place])   . Arthritis    "knees, feet, hands; joints" (05/27/2015)  . Asthma   . Atrial fibrillation or flutter    s/p RFCA 7/08;   s/p DCCV in past;   previously on amiodarone;  amio stopped due to lung toxicity    . CAD (coronary artery disease)    s/p NSTEMI tx with BMS to OM1 3/08;  cath 3/08: pOM 99% tx with PCI, pLAD 20%, ? mod stenosis at the AM  . CAP (community acquired pneumonia) 05/24/2015  . Chronic diastolic heart failure (HCC)    echo 11/11:  EF 55-60%, severe LVH, mod LAE, mild MR, mildly increased PASP  . COPD (chronic obstructive pulmonary disease) ([place])   . Degenerative joint disease   . Dystrophy, corneal stromal   . Heart murmur   . HLD (hyperlipidemia)   . HTN (hypertension)    essential nos  . Hypopotassemia    PMH of  . Muscle pain   . Myocardial infarction 2008  . OSA on CPAP   . Osteoporosis   . Pneumonia 05/27/2015  . Protein calorie malnutrition ([place])   . Rash and nonspecific skin eruption    both arms,awaiting bio   dr Delman Cheadle  . Seasonal allergies   . Shortness of breath dyspnea   . Type II diabetes mellitus ([place])    Dr Loanne Drilling   Past Surgical History:  Procedure Laterality Date  . A FLUTTER ABLATION     Dr Lovena Le  . CATARACT EXTRACTION W/ INTRAOCULAR LENS  IMPLANT, BILATERAL Bilateral   .  CHOLECYSTECTOMY    . COLONOSCOPY  6/07   2 polyps Dr. Kinnie Feil in [place]  . colonoscopy with polypectomy      X 2; [facility]  . CORNEAL TRANSPLANT Bilateral   . CORONARY ANGIOPLASTY WITH STENT PLACEMENT  02/2007   BMS w/Dr Lovena Le  . EYE SURGERY    . gravid 2 para 2    . KNEE CARTILAGE SURGERY Right   . LEFT AND RIGHT HEART CATHETERIZATION WITH CORONARY ANGIOGRAM N/A 05/07/2014   Procedure: LEFT AND RIGHT HEART CATHETERIZATION WITH CORONARY ANGIOGRAM;  Surgeon: Peter M Martinique, MD;  [facility] C[facility] LAB;  Service: Cardiovascular;  Laterality: N/A;  . TOTAL KNEE ARTHROPLASTY Right 06/28/2016   Procedure: TOTAL KNEE ARTHROPLASTY;  Surgeon: Frederik Pear, MD;  Location: [place];  Service: Orthopedics;  Laterality: Right;   Family History  Problem Relation Age of Onset  . Diabetes Mother   . Hypertension Mother   . Transient ischemic attack Mother   . Arthritis Mother   . Heart  attack Father 10  . Arthritis Father   . Hypertension Father   . Breast cancer Maternal Aunt   . Arthritis Maternal Aunt    History  Sexual Activity  . Sexual activity: No    Outpatient Encounter Prescriptions as of 02/07/2017  Medication Sig  . apixaban (ELIQUIS) 2.5 MG TABS tablet Take 1 tablet (2.5 mg total) by mouth 2 (two) times daily.  . carvedilol (COREG) 12.5 MG tablet Take 1 tablet (12.5 mg total) by mouth 2 (two) times daily with a meal.  . clotrimazole (LOTRIMIN) 1 % cream Apply 1 application topically 2 (two) times daily. To inner thighs  . diclofenac sodium (VOLTAREN) 1 % GEL Apply 2 g topically 2 (two) times daily as needed (for pain).  . furosemide (LASIX) 40 MG tablet TAKE TWO TABLETS BY MOUTH ONCE DAILY  . gabapentin (NEURONTIN) 100 MG capsule Take 1 capsule (100 mg total) by mouth at bedtime. If tolerated after two days increase to 200 mg nightly (Patient taking differently: Take 200 mg by mouth at bedtime. If tolerated after two days increase to 200 mg nightly)  . ibuprofen (ADVIL,MOTRIN) 200 MG tablet Take 400 mg by mouth every 6 (six) hours as needed.  . insulin NPH Human (HUMULIN N,NOVOLIN N) 100 UNIT/ML injection Inject 0.42 mLs (42 Units total) into the skin daily.  . Lancets (ONETOUCH ULTRASOFT) lancets Use to check blood sugar 2 times per day. Dx Code E11.9  . methimazole (TAPAZOLE) 5 MG tablet Take 1 tablet (5 mg total) by mouth 2 (two) times a week. Wednesday and Friday.  . Multiple Vitamins-Minerals (CENTRUM SILVER ULTRA WOMENS PO) Take 1 tablet by mouth daily.   . nitroGLYCERIN (NITROSTAT) 0.4 MG SL tablet Place 0.4 mg under the tongue every 5 (five) minutes as needed for chest pain. Reported on 01/28/2016  . ONE TOUCH ULTRA TEST test strip USE TO CHECK BLOOD SUGAR 2 TIMES PER DAY  . potassium chloride SA (K-DUR,KLOR-CON) 20 MEQ tablet Take 1 tablet (59mEq) daily every other day alternating with 2 tablets (33mEq) daily every other day  . Turmeric 500 MG CAPS Take  500 mg by mouth daily.  . Vitamin D, Cholecalciferol, 1000 units TABS Take 2,000 Units by mouth daily.   . [DISCONTINUED] umeclidinium-vilanterol (ANORO ELLIPTA) 62.5-25 MCG/INH AEPB Inhale 1 puff into the lungs daily. (Patient not taking: Reported on 02/07/2017)   No facility-administered encounter medications on file as of 02/07/2017.     Activities of Daily Living In  your present state of health, do you have any difficulty performing the following activities: 02/07/2017 06/28/2016  Hearing? N N  Vision? N N  Difficulty concentrating or making decisions? N N  Walking or climbing stairs? Y Y  Dressing or bathing? N N  Doing errands, shopping? N N  Preparing Food and eating ? N -  Using the Toilet? N -  In the past six months, have you accidently leaked urine? Y -  Do you have problems with loss of bowel control? N -  Managing your Medications? N -  Managing your Finances? N -  Housekeeping or managing your Housekeeping? N -  Some recent data might be hidden    Patient Care Team: [name], MD as PCP - General (Internal Medicine) Chesley Mires, MD as Consulting Physician (Pulmonary Disease) Renato Shin, MD as Consulting Physician (Endocrinology) Jolaine Artist, MD as Consulting Physician (Cardiology)    Assessment:    Physical assessment deferred to PCP.  Exercise Activities and Dietary recommendations Current Exercise Habits: Structured exercise class, Type of exercise: walking;yoga (water aerobics, bicycle), Time (Minutes): 45, Frequency (Times/Week): 3, Weekly Exercise (Minutes/Week): 135, Intensity: Moderate, Exercise limited by: orthopedic condition(s)  Diet (meal preparation, eat out, water intake, caffeinated beverages, dairy products, fruits and vegetables): in general, a "healthy" diet  , well balanced   Eats all brown grains, eats a variety of fruits, vegetables, seafood and poultry, bakes or broils food, drinks 10-12 cups of water daily.    Goals    . maintain  current health status          Continue to exercise and eat healthy.      Fall Risk Fall Risk  02/07/2017 01/27/2017 08/22/2014 07/19/2013  Falls in the past year? Yes Yes Yes Yes  Number falls in past yr: - 1 1 1   Injury with Fall? No No Yes Yes  Risk for fall due to : Impaired balance/gait;Impaired mobility;Impaired vision - Impaired balance/gait -  Risk for fall due to (comments): Fall prevention education provided  - - -   Depression Screen PHQ 2/9 Scores 02/07/2017 01/27/2017 08/22/2014 07/19/2013  PHQ - 2 Score 0 0 0 0     Cognitive Function       Ad8 score reviewed for issues:  Issues making decisions: no  Less interest in hobbies / activities: no  Repeats questions, stories (family complaining): no  Trouble using ordinary gadgets (microwave, computer, phone): no  Forgets the month or year: no  Mismanaging finances: no  Remembering appts: no  Daily problems with thinking and/or memory: no Ad8 score is= 0     Immunization History  Administered Date(s) Administered  . Influenza Split 11/16/2011, 09/26/2012  . Influenza Whole 12/27/2004, 10/21/2008, 10/20/2009, 11/11/2010  . Influenza, High Dose Seasonal PF 10/14/2016  . Influenza,inj,Quad PF,36+ Mos 09/24/2013, 12/16/2014, 09/19/2015  . PPD Test 05/31/2015, 07/07/2016  . Pneumococcal Polysaccharide-23 12/16/2014  . Pneumococcal-Unspecified 05/31/2015  . Td 06/15/2010   Screening Tests Health Maintenance  Topic Date Due  . ZOSTAVAX  11/11/1993  . PNA vac Low Risk Adult (2 of 2 - PCV13) 12/27/2017 (Originally 05/30/2016)  . DEXA SCAN  02/01/2018 (Originally 11/11/1998)  . OPHTHALMOLOGY EXAM  02/24/2017  . HEMOGLOBIN A1C  07/24/2017  . URINE MICROALBUMIN  09/08/2017  . FOOT EXAM  01/24/2018  . TETANUS/TDAP  06/15/2020  . INFLUENZA VACCINE  Completed      Plan:     Continue to eat heart healthy diet (full of fruits, vegetables, whole grains,  lean protein, water--limit salt, fat, and sugar intake) and  increase physical activity as tolerated.  Continue doing brain stimulating activities (puzzles, reading, adult coloring books, staying active) to keep memory sharp.   Notified Dr. Quay Burow that there has been little improvement regarding the groin rash or left hip pain after lotrimin and gabapentin have been ordered 01/27/17  During the course of the visit the patient was educated and counseled about the following appropriate screening and preventive services:   Vaccines to include Pneumoccal, Influenza, Hepatitis B, Td, Zostavax, HCV  Cardiovascular Disease  Colorectal cancer screening  Bone density screening  Diabetes screening  Glaucoma screening  Mammography/PAP  Nutrition counseling   Patient Instructions (the written plan) was given to the patient.   Michiel Cowboy, RN  02/07/2017   Medical screening examination/treatment/procedure(s) were performed by non-physician practitioner and as supervising physician I was immediately available for consultation/collaboration. I agree with above. [name], MD

## 2017-02-04 NOTE — Progress Notes (Signed)
Pre visit review using our clinic review tool, if applicable. No additional management support is needed unless otherwise documented below in the visit note. 

## 2017-02-07 ENCOUNTER — Telehealth: Payer: Self-pay | Admitting: *Deleted

## 2017-02-07 ENCOUNTER — Ambulatory Visit (INDEPENDENT_AMBULATORY_CARE_PROVIDER_SITE_OTHER): Payer: Medicare Other | Admitting: *Deleted

## 2017-02-07 VITALS — BP 138/55 | HR 66 | Resp 21 | Ht 69.0 in | Wt 182.0 lb

## 2017-02-07 DIAGNOSIS — Z0000 Encounter for general adult medical examination without abnormal findings: Secondary | ICD-10-CM

## 2017-02-07 DIAGNOSIS — R21 Rash and other nonspecific skin eruption: Secondary | ICD-10-CM

## 2017-02-07 NOTE — Telephone Encounter (Signed)
Patient informed health coach during AWV that there has been little improvement regarding the groin rash or left hip pain after lotrimin and gabapentin have been ordered 01/27/17.  She states she is taking Advil as needed to help with the hip pain.

## 2017-02-07 NOTE — Telephone Encounter (Signed)
I would recommend she sees derm for the rash. I can refer if she likes.   If she is tolerating the gabapentin we can increase to 200 mg at night.  Does she want to see Dr Tamala Julian for further evaluation?

## 2017-02-07 NOTE — Patient Instructions (Addendum)
[name] , Thank you for taking time to come for your Medicare Wellness Visit. I appreciate your ongoing commitment to your health goals. Please review the following plan we discussed and let me know if I can assist you in the future.   These are the goals we discussed: Goals    None      This is a list of the screening recommended for you and due dates:  Health Maintenance  Topic Date Due  . Shingles Vaccine  11/11/1993  . Pneumonia vaccines (2 of 2 - PCV13) 12/27/2017*  . DEXA scan (bone density measurement)  02/01/2018*  . Eye exam for diabetics  02/24/2017  . Hemoglobin A1C  07/24/2017  . Urine Protein Check  09/08/2017  . Complete foot exam   01/24/2018  . Tetanus Vaccine  06/15/2020  . Flu Shot  Completed  *Topic was postponed. The date shown is not the original due date.   Continue to eat heart healthy diet (full of fruits, vegetables, whole grains, lean protein, water--limit salt, fat, and sugar intake) and increase physical activity as tolerated.  Continue doing brain stimulating activities (puzzles, reading, adult coloring books, staying active) to keep memory sharp.   Call to add on Bone Density screening Bone Densitometry Introduction Bone densitometry is an imaging test that uses a special X-ray to measure the amount of calcium and other minerals in your bones (bone density). This test is also known as a bone mineral density test or dual-energy X-ray absorptiometry (DXA). The test can measure bone density at your hip and your spine. It is similar to having a regular X-ray. You may have this test to:  Diagnose a condition that causes weak or thin bones (osteoporosis).  Predict your risk of a broken bone (fracture).  Determine how well osteoporosis treatment is working. Tell a health care provider about:  Any allergies you have.  All medicines you are taking, including vitamins, herbs, eye drops, creams, and over-the-counter medicines.  Any problems you or  family members have had with anesthetic medicines.  Any blood disorders you have.  Any surgeries you have had.  Any medical conditions you have.  Possibility of pregnancy.  Any other medical test you had within the previous 14 days that used contrast material. What are the risks? Generally, this is a safe procedure. However, problems can occur and may include the following:  This test exposes you to a very small amount of radiation.  The risks of radiation exposure may be greater to unborn children. What happens before the procedure?  Do not take any calcium supplements for 24 hours before having the test. You can otherwise eat and drink what you usually do.  Take off all metal jewelry, eyeglasses, dental appliances, and any other metal objects. What happens during the procedure?  You may lie on an exam table. There will be an X-ray generator below you and an imaging device above you.  Other devices, such as boxes or braces, may be used to position your body properly for the scan.  You will need to lie still while the machine slowly scans your body.  The images will show up on a computer monitor. What happens after the procedure? You may need more testing at a later time. This information is not intended to replace advice given to you by your health care provider. Make sure you discuss any questions you have with your health care provider. Document Released: 01/04/2005 Document Revised: 05/20/2016 Document Reviewed: 05/23/2014  2017 [employer]  Fall Prevention in the Home Introduction Falls can cause injuries. They can happen to people of all ages. There are many things you can do to make your home safe and to help prevent falls. What can I do on the outside of my home?  Regularly fix the edges of walkways and driveways and fix any cracks.  Remove anything that might make you trip as you walk through a door, such as a raised step or threshold.  Trim any bushes or trees  on the path to your home.  Use bright outdoor lighting.  Clear any walking paths of anything that might make someone trip, such as rocks or tools.  Regularly check to see if handrails are loose or broken. Make sure that both sides of any steps have handrails.  Any raised decks and porches should have guardrails on the edges.  Have any leaves, snow, or ice cleared regularly.  Use sand or salt on walking paths during winter.  Clean up any spills in your garage right away. This includes oil or grease spills. What can I do in the bathroom?  Use night lights.  Install grab bars by the toilet and in the tub and shower. Do not use towel bars as grab bars.  Use non-skid mats or decals in the tub or shower.  If you need to sit down in the shower, use a plastic, non-slip stool.  Keep the floor dry. Clean up any water that spills on the floor as soon as it happens.  Remove soap buildup in the tub or shower regularly.  Attach bath mats securely with double-sided non-slip rug tape.  Do not have throw rugs and other things on the floor that can make you trip. What can I do in the bedroom?  Use night lights.  Make sure that you have a light by your bed that is easy to reach.  Do not use any sheets or blankets that are too big for your bed. They should not hang down onto the floor.  Have a firm chair that has side arms. You can use this for support while you get dressed.  Do not have throw rugs and other things on the floor that can make you trip. What can I do in the kitchen?  Clean up any spills right away.  Avoid walking on wet floors.  Keep items that you use a lot in easy-to-reach places.  If you need to reach something above you, use a strong step stool that has a grab bar.  Keep electrical cords out of the way.  Do not use floor polish or wax that makes floors slippery. If you must use wax, use non-skid floor wax.  Do not have throw rugs and other things on the floor  that can make you trip. What can I do with my stairs?  Do not leave any items on the stairs.  Make sure that there are handrails on both sides of the stairs and use them. Fix handrails that are broken or loose. Make sure that handrails are as long as the stairways.  Check any carpeting to make sure that it is firmly attached to the stairs. Fix any carpet that is loose or worn.  Avoid having throw rugs at the top or bottom of the stairs. If you do have throw rugs, attach them to the floor with carpet tape.  Make sure that you have a light switch at the top of the stairs and the bottom of the stairs. If  you do not have them, ask someone to add them for you. What else can I do to help prevent falls?  Wear shoes that:  Do not have high heels.  Have rubber bottoms.  Are comfortable and fit you well.  Are closed at the toe. Do not wear sandals.  If you use a stepladder:  Make sure that it is fully opened. Do not climb a closed stepladder.  Make sure that both sides of the stepladder are locked into place.  Ask someone to hold it for you, if possible.  Clearly mark and make sure that you can see:  Any grab bars or handrails.  First and last steps.  Where the edge of each step is.  Use tools that help you move around (mobility aids) if they are needed. These include:  Canes.  Walkers.  Scooters.  Crutches.  Turn on the lights when you go into a dark area. Replace any light bulbs as soon as they burn out.  Set up your furniture so you have a clear path. Avoid moving your furniture around.  If any of your floors are uneven, fix them.  If there are any pets around you, be aware of where they are.  Review your medicines with your doctor. Some medicines can make you feel dizzy. This can increase your chance of falling. Ask your doctor what other things that you can do to help prevent falls. This information is not intended to replace advice given to you by your health  care provider. Make sure you discuss any questions you have with your health care provider. Document Released: 10/09/2009 Document Revised: 05/20/2016 Document Reviewed: 01/17/2015  2017 [employer]  Health Maintenance, Female Introduction Adopting a healthy lifestyle and getting preventive care can go a long way to promote health and wellness. Talk with your health care provider about what schedule of regular examinations is right for you. This is a good chance for you to check in with your provider about disease prevention and staying healthy. In between checkups, there are plenty of things you can do on your own. Experts have done a lot of research about which lifestyle changes and preventive measures are most likely to keep you healthy. Ask your health care provider for more information. Weight and diet Eat a healthy diet  Be sure to include plenty of vegetables, fruits, low-fat dairy products, and lean protein.  Do not eat a lot of foods high in solid fats, added sugars, or salt.  Get regular exercise. This is one of the most important things you can do for your health.  Most adults should exercise for at least 150 minutes each week. The exercise should increase your heart rate and make you sweat (moderate-intensity exercise).  Most adults should also do strengthening exercises at least twice a week. This is in addition to the moderate-intensity exercise. Maintain a healthy weight  Body mass index (BMI) is a measurement that can be used to identify possible weight problems. It estimates body fat based on height and weight. Your health care provider can help determine your BMI and help you achieve or maintain a healthy weight.  For females 13 years of age and older:  A BMI below 18.5 is considered underweight.  A BMI of 18.5 to 24.9 is normal.  A BMI of 25 to 29.9 is considered overweight.  A BMI of 30 and above is considered obese. Watch levels of cholesterol and blood  lipids  You should start having your blood tested  for lipids and cholesterol at 81 years of age, then have this test every 5 years.  You may need to have your cholesterol levels checked more often if:  Your lipid or cholesterol levels are high.  You are older than 81 years of age.  You are at high risk for heart disease. Cancer screening Lung Cancer  Lung cancer screening is recommended for adults 18-45 years old who are at high risk for lung cancer because of a history of smoking.  A yearly low-dose CT scan of the lungs is recommended for people who:  Currently smoke.  Have quit within the past 15 years.  Have at least a 30-pack-year history of smoking. A pack year is smoking an average of one pack of cigarettes a day for 1 year.  Yearly screening should continue until it has been 15 years since you quit.  Yearly screening should stop if you develop a health problem that would prevent you from having lung cancer treatment. Breast Cancer  Practice breast self-awareness. This means understanding how your breasts normally appear and feel.  It also means doing regular breast self-exams. Let your health care provider know about any changes, no matter how small.  If you are in your 20s or 30s, you should have a clinical breast exam (CBE) by a health care provider every 1-3 years as part of a regular health exam.  If you are 80 or older, have a CBE every year. Also consider having a breast X-ray (mammogram) every year.  If you have a family history of breast cancer, talk to your health care provider about genetic screening.  If you are at high risk for breast cancer, talk to your health care provider about having an MRI and a mammogram every year.  Breast cancer gene (BRCA) assessment is recommended for women who have family members with BRCA-related cancers. BRCA-related cancers include:  Breast.  Ovarian.  Tubal.  Peritoneal cancers.  Results of the assessment will  determine the need for genetic counseling and BRCA1 and BRCA2 testing. Cervical Cancer  Your health care provider may recommend that you be screened regularly for cancer of the pelvic organs (ovaries, uterus, and vagina). This screening involves a pelvic examination, including checking for microscopic changes to the surface of your cervix (Pap test). You may be encouraged to have this screening done every 3 years, beginning at age 40.  For women ages 28-65, health care providers may recommend pelvic exams and Pap testing every 3 years, or they may recommend the Pap and pelvic exam, combined with testing for human papilloma virus (HPV), every 5 years. Some types of HPV increase your risk of cervical cancer. Testing for HPV may also be done on women of any age with unclear Pap test results.  Other health care providers may not recommend any screening for nonpregnant women who are considered low risk for pelvic cancer and who do not have symptoms. Ask your health care provider if a screening pelvic exam is right for you.  If you have had past treatment for cervical cancer or a condition that could lead to cancer, you need Pap tests and screening for cancer for at least 20 years after your treatment. If Pap tests have been discontinued, your risk factors (such as having a new sexual partner) need to be reassessed to determine if screening should resume. Some women have medical problems that increase the chance of getting cervical cancer. In these cases, your health care provider may recommend more frequent screening  and Pap tests. Colorectal Cancer  This type of cancer can be detected and often prevented.  Routine colorectal cancer screening usually begins at 81 years of age and continues through 81 years of age.  Your health care provider may recommend screening at an earlier age if you have risk factors for colon cancer.  Your health care provider may also recommend using home test kits to check for  hidden blood in the stool.  A small camera at the end of a tube can be used to examine your colon directly (sigmoidoscopy or colonoscopy). This is done to check for the earliest forms of colorectal cancer.  Routine screening usually begins at age 74.  Direct examination of the colon should be repeated every 5-10 years through 81 years of age. However, you may need to be screened more often if early forms of precancerous polyps or small growths are found. Skin Cancer  Check your skin from head to toe regularly.  Tell your health care provider about any new moles or changes in moles, especially if there is a change in a mole's shape or color.  Also tell your health care provider if you have a mole that is larger than the size of a pencil eraser.  Always use sunscreen. Apply sunscreen liberally and repeatedly throughout the day.  Protect yourself by wearing long sleeves, pants, a wide-brimmed hat, and sunglasses whenever you are outside. Heart disease, diabetes, and high blood pressure  High blood pressure causes heart disease and increases the risk of stroke. High blood pressure is more likely to develop in:  People who have blood pressure in the high end of the normal range (130-139/85-89 mm Hg).  People who are overweight or obese.  People who are African American.  If you are 2-71 years of age, have your blood pressure checked every 3-5 years. If you are 70 years of age or older, have your blood pressure checked every year. You should have your blood pressure measured twice-once when you are at a hospital or clinic, and once when you are not at a hospital or clinic. Record the average of the two measurements. To check your blood pressure when you are not at a hospital or clinic, you can use:  An automated blood pressure machine at a pharmacy.  A home blood pressure monitor.  If you are between 80 years and 38 years old, ask your health care provider if you should take aspirin to  prevent strokes.  Have regular diabetes screenings. This involves taking a blood sample to check your fasting blood sugar level.  If you are at a normal weight and have a low risk for diabetes, have this test once every three years after 81 years of age.  If you are overweight and have a high risk for diabetes, consider being tested at a younger age or more often. Preventing infection Hepatitis B  If you have a higher risk for hepatitis B, you should be screened for this virus. You are considered at high risk for hepatitis B if:  You were born in a country where hepatitis B is common. Ask your health care provider which countries are considered high risk.  Your parents were born in a high-risk country, and you have not been immunized against hepatitis B (hepatitis B vaccine).  You have HIV or AIDS.  You use needles to inject street drugs.  You live with someone who has hepatitis B.  You have had sex with someone who has hepatitis B.  You get hemodialysis treatment.  You take certain medicines for conditions, including cancer, organ transplantation, and autoimmune conditions. Hepatitis C  Blood testing is recommended for:  Everyone born from [birth year] through 1963.  Anyone with known risk factors for hepatitis C. Sexually transmitted infections (STIs)  You should be screened for sexually transmitted infections (STIs) including gonorrhea and chlamydia if:  You are sexually active and are younger than 81 years of age.  You are older than 81 years of age and your health care provider tells you that you are at risk for this type of infection.  Your sexual activity has changed since you were last screened and you are at an increased risk for chlamydia or gonorrhea. Ask your health care provider if you are at risk.  If you do not have HIV, but are at risk, it may be recommended that you take a prescription medicine daily to prevent HIV infection. This is called pre-exposure  prophylaxis (PrEP). You are considered at risk if:  You are sexually active and do not regularly use condoms or know the HIV status of your partner(s).  You take drugs by injection.  You are sexually active with a partner who has HIV. Talk with your health care provider about whether you are at high risk of being infected with HIV. If you choose to begin PrEP, you should first be tested for HIV. You should then be tested every 3 months for as long as you are taking PrEP. Pregnancy  If you are premenopausal and you may become pregnant, ask your health care provider about preconception counseling.  If you may become pregnant, take 400 to 800 micrograms (mcg) of folic acid every day.  If you want to prevent pregnancy, talk to your health care provider about birth control (contraception). Osteoporosis and menopause  Osteoporosis is a disease in which the bones lose minerals and strength with aging. This can result in serious bone fractures. Your risk for osteoporosis can be identified using a bone density scan.  If you are 39 years of age or older, or if you are at risk for osteoporosis and fractures, ask your health care provider if you should be screened.  Ask your health care provider whether you should take a calcium or vitamin D supplement to lower your risk for osteoporosis.  Menopause may have certain physical symptoms and risks.  Hormone replacement therapy may reduce some of these symptoms and risks. Talk to your health care provider about whether hormone replacement therapy is right for you. Follow these instructions at home:  Schedule regular health, dental, and eye exams.  Stay current with your immunizations.  Do not use any tobacco products including cigarettes, chewing tobacco, or electronic cigarettes.  If you are pregnant, do not drink alcohol.  If you are breastfeeding, limit how much and how often you drink alcohol.  Limit alcohol intake to no more than 1 drink  per day for nonpregnant women. One drink equals 12 ounces of beer, 5 ounces of Larah Kuntzman, or 1 ounces of hard liquor.  Do not use street drugs.  Do not share needles.  Ask your health care provider for help if you need support or information about quitting drugs.  Tell your health care provider if you often feel depressed.  Tell your health care provider if you have ever been abused or do not feel safe at home. This information is not intended to replace advice given to you by your health care provider. Make sure you discuss any  questions you have with your health care provider. Document Released: 06/28/2011 Document Revised: 05/20/2016 Document Reviewed: 09/16/2015  2017 [employer]vier

## 2017-02-08 ENCOUNTER — Telehealth: Payer: Self-pay | Admitting: Internal Medicine

## 2017-02-08 DIAGNOSIS — Z13820 Encounter for screening for osteoporosis: Secondary | ICD-10-CM

## 2017-02-08 NOTE — Telephone Encounter (Signed)
Please advise 

## 2017-02-08 NOTE — Addendum Note (Signed)
Addended by: [name] on: 02/08/2017 12:59 PM   Modules accepted: Orders

## 2017-02-08 NOTE — Telephone Encounter (Signed)
Derm referral ordered

## 2017-02-08 NOTE — Telephone Encounter (Signed)
Patient is calling in because she has an appointment on Feb 21 at [facility] imaging for a dexa scan she needs referral ordered for that. They informed her they could do it there because she is already there to get a mammogram. Please follow up patient.  [phone]

## 2017-02-08 NOTE — Telephone Encounter (Signed)
Spoke with pt, she is currently taking 200 mg of Gabapentin at night and still having pain. I was able to make her an appt with Dr Tamala Julian on 02/16/17.   She would like referral sent to derm.

## 2017-02-08 NOTE — Telephone Encounter (Signed)
ordered

## 2017-02-11 ENCOUNTER — Other Ambulatory Visit: Payer: Self-pay | Admitting: Emergency Medicine

## 2017-02-11 DIAGNOSIS — E2839 Other primary ovarian failure: Secondary | ICD-10-CM

## 2017-02-11 NOTE — Progress Notes (Unsigned)
Orders have been re-entered for Bone Density per request of [facility].

## 2017-02-14 NOTE — Telephone Encounter (Signed)
Patient is calling on the status of her 90 day supplies of her diabetic supplies.

## 2017-02-15 NOTE — Progress Notes (Signed)
[facility] [place], [place] [address] Phone: [phone] Subjective:    I'm seeing this patient by the request  of:  [name], MD   CC: Left hip pain  [id number]  [name] is a 83 y.o. female coming in with complaint of left hip pain.  Patient did recently have a knee replacement on the right side. States that that this was difficult but is doing much better at this time. Patient states that she has been walking more frequently. Patient states that with walking she is having unfortunately some increasing pain on the left hip. Seems to be more on the posterior aspect. Mild radiation down the leg, no weakness. Patient states that the pain can be very difficult. Seems to get better with rest. Has been doing yoga class with mild improvement. Rates the severity of pain a 6 out of 10  patient's previous imaging in June 2017 patient's hips were independently visualized by me. Hip show the patient does have moderate osteophytic changes as well as moderate pubic symphysitis.   Past Medical History:  Diagnosis Date  . Anemia    iron defficiency  . Anginal pain ([place])   . Arthritis    "knees, feet, hands; joints" (05/27/2015)  . Asthma   . Atrial fibrillation or flutter    s/p RFCA 7/08;   s/p DCCV in past;   previously on amiodarone;  amio stopped due to lung toxicity  . CAD (coronary artery disease)    s/p NSTEMI tx with BMS to OM1 3/08;  cath 3/08: pOM 99% tx with PCI, pLAD 20%, ? mod stenosis at the AM  . CAP (community acquired pneumonia) 05/24/2015  . Chronic diastolic heart failure (HCC)    echo 11/11:  EF 55-60%, severe LVH, mod LAE, mild MR, mildly increased PASP  . COPD (chronic obstructive pulmonary disease) ([place])   . Degenerative joint disease   . Dystrophy, corneal stromal   . Heart murmur   . HLD (hyperlipidemia)   . HTN (hypertension)    essential nos  . Hypopotassemia    PMH of  . Muscle pain   . Myocardial  infarction 2008  . OSA on CPAP   . Osteoporosis   . Pneumonia 05/27/2015  . Protein calorie malnutrition ([place])   . Rash and nonspecific skin eruption    both arms,awaiting bio   dr Delman Cheadle  . Seasonal allergies   . Shortness of breath dyspnea   . Type II diabetes mellitus ([place])    Dr Loanne Drilling   Past Surgical History:  Procedure Laterality Date  . A FLUTTER ABLATION     Dr Lovena Le  . CATARACT EXTRACTION W/ INTRAOCULAR LENS  IMPLANT, BILATERAL Bilateral   . CHOLECYSTECTOMY    . COLONOSCOPY  6/07   2 polyps Dr. Kinnie Feil in [place]  . colonoscopy with polypectomy      X 2; [facility]  . CORNEAL TRANSPLANT Bilateral   . CORONARY ANGIOPLASTY WITH STENT PLACEMENT  02/2007   BMS w/Dr Lovena Le  . EYE SURGERY    . gravid 2 para 2    . KNEE CARTILAGE SURGERY Right   . LEFT AND RIGHT HEART CATHETERIZATION WITH CORONARY ANGIOGRAM N/A 05/07/2014   Procedure: LEFT AND RIGHT HEART CATHETERIZATION WITH CORONARY ANGIOGRAM;  Surgeon: Peter M Martinique, MD;  Location: [facility] CATH LAB;  Service: Cardiovascular;  Laterality: N/A;  . TOTAL KNEE ARTHROPLASTY Right 06/28/2016   Procedure: TOTAL KNEE ARTHROPLASTY;  Surgeon: Pilar Plate  Mayer Camel, MD;  Location: [facility];  Service: Orthopedics;  Laterality: Right;   Social History   Social History  . Marital status: Widowed    Spouse name: N/A  . Number of children: N/A  . Years of education: N/A   Occupational History  . Teacher    Social History Main Topics  . Smoking status: Former Smoker    Packs/day: 0.25    Years: 18.00    Types: Cigarettes    Quit date: 12/27/1968  . Smokeless tobacco: Never Used     Comment: smoked [place], up to 1 pp week  . Alcohol use Yes     Comment: occ  . Drug use: No  . Sexual activity: No   Other Topics Concern  . None   Social History Sports administrator. Widowed. Rarely drinks cafeine.    Allergies  Allergen Reactions  . Amlodipine Besylate Other (See Comments)    REACTION: tingling in lips & gum edema  . Levaquin [Levofloxacin]  Shortness Of Breath and Swelling    angioedema  . Lobster [Shellfish Allergy] Other (See Comments)    angioedema  . Penicillins Rash  . Valsartan Other (See Comments)    REACTION: angioedema  . Codeine Other (See Comments)    Mental status changes  . Lipitor [Atorvastatin]     weakness  . Oxycodone   . Statins     Made her too weak  . Tramadol Hcl Nausea And Vomiting   Family History  Problem Relation Age of Onset  . Diabetes Mother   . Hypertension Mother   . Transient ischemic attack Mother   . Arthritis Mother   . Heart attack Father 40  . Arthritis Father   . Hypertension Father   . Breast cancer Maternal Aunt   . Arthritis Maternal Aunt     Past medical history, social, surgical and family history all reviewed in electronic medical record.  No pertanent information unless stated regarding to the chief complaint.   Review of Systems: No headache, visual changes, nausea, vomiting, diarrhea, constipation, dizziness, abdominal pain, skin rash, fevers, chills, night sweats, weight loss, swollen lymph nodes, chest pain, shortness of breath, mood changes.  Positive muscle aches  Objective  Blood pressure 134/72, pulse 85, height 5\' 9"  (1.753 m), weight 179 lb (81.2 kg), SpO2 92 %. Systems examined below as of 02/16/17   Systems examined below as of 02/16/17 General: NAD A&O x3 mood, affect normal  HEENT: Pupils equal, extraocular movements intact no nystagmus Respiratory: not short of breath at rest or with speaking Cardiovascular: No lower extremity edema, non tender Skin: Warm dry intact with no signs of infection or rash on extremities or on axial skeleton. Abdomen: Soft nontender, no masses Neuro: Cranial nerves  intact, neurovascularly intact in all extremities with 2+ DTRs and 2+ pulses. Lymph: No lymphadenopathy appreciated today  Gait antalgic gait MSK: Non tender with full range of motion and good stability and symmetric strength and tone of shoulders, elbows,  wrist,  knee and ankles bilaterally.  Arthritic changes of multiple joints recent knee replacement with well-healed in full range of motion. Length discrepancy noted with right leg being quarter inch shorter. Hip: Left ROM IR: 15 Deg, ER: 45 Deg, Flexion: 120 Deg, Extension: 100 Deg, Abduction: 45 Deg, Adduction: 25 Deg Strength Forward 5 strength but symmetric Pelvic alignment unremarkable to inspection and palpation. Standing hip rotation and gait without trendelenburg sign / unsteadiness. Greater trochanter without tenderness to palpation. Minimal tenderness to palpation  in the gluteal region.. No pain with FABER or FADIR. No SI joint tenderness and normal minimal SI movement. Contralateral hip unremarkable  Procedure note 97110; 15 minutes spent for Therapeutic exercises as stated in above notes.  This included exercises focusing on stretching, strengthening, with significant focus on eccentric aspects.  Hip strengthening exercises which included:  Pelvic tilt/bracing to help with proper recruitment of the lower abs and pelvic floor muscles  Glute strengthening to properly contract glutes without over-engaging low back and hamstrings - prone hip extension and glute bridge exercises Proper stretching techniques to increase effectiveness for the hip flexors, groin, quads, piriformic and low back when appropriate    Proper technique shown and discussed handout in great detail with ATC.  All questions were discussed and answered.     Impression and Recommendations:     This case required medical decision making of moderate complexity.      Note: This dictation was prepared with Dragon dictation along with smaller phrase technology. Any transcriptional errors that result from this process are unintentional.

## 2017-02-15 NOTE — Telephone Encounter (Signed)
Patient advised request was submitted on 02/14/2017.

## 2017-02-16 ENCOUNTER — Ambulatory Visit (INDEPENDENT_AMBULATORY_CARE_PROVIDER_SITE_OTHER): Payer: Medicare Other | Admitting: Family Medicine

## 2017-02-16 ENCOUNTER — Ambulatory Visit
Admission: RE | Admit: 2017-02-16 | Discharge: 2017-02-16 | Disposition: A | Discharge status: Home / Self Care | Payer: Medicare Other | Source: Ambulatory Visit | Attending: Internal Medicine | Admitting: Internal Medicine

## 2017-02-16 ENCOUNTER — Encounter: Payer: Self-pay | Admitting: Family Medicine

## 2017-02-16 DIAGNOSIS — M2170 Unequal limb length (acquired), unspecified site: Secondary | ICD-10-CM | POA: Insufficient documentation

## 2017-02-16 DIAGNOSIS — M85832 Other specified disorders of bone density and structure, left forearm: Secondary | ICD-10-CM | POA: Diagnosis not present

## 2017-02-16 DIAGNOSIS — Z1231 Encounter for screening mammogram for malignant neoplasm of breast: Secondary | ICD-10-CM

## 2017-02-16 DIAGNOSIS — E2839 Other primary ovarian failure: Secondary | ICD-10-CM

## 2017-02-16 DIAGNOSIS — G5702 Lesion of sciatic nerve, left lower limb: Secondary | ICD-10-CM

## 2017-02-16 DIAGNOSIS — Z780 Asymptomatic menopausal state: Secondary | ICD-10-CM | POA: Diagnosis not present

## 2017-02-16 NOTE — Patient Instructions (Signed)
Good to see you  Ice 20 minutes 2 times daily. Usually after activity and before bed. Exercises 3 times a week.  Tennis ball in back left pocket with sitting a long amount of time.  Continue the vitamins Insole in the right shoe only to help with the leg length  See me again in 4 weeks if not perfect

## 2017-02-16 NOTE — Assessment & Plan Note (Signed)
This is likely secondary to patient's total knee replacement. Patient does have a quarter inch shorter on the right. Encourage her to get an over-the-counter insole. No other changes likely necessary. We'll probably unload patient's hip as well. Patient follow-up in 4 weeks

## 2017-02-16 NOTE — Assessment & Plan Note (Signed)
Piriformis Syndrome  Using an anatomical model, reviewed with the patient the structures involved and how they related to diagnosis. The patient indicated understanding.   The patient was given a handout from Dr. Arne [place] book "The Sports Medicine Patient Advisor" describing the anatomy and rehabilitation of the following condition: Piriformis Syndrome  Also given a handout with more extensive Piriformis stretching, hip flexor and abductor strengthening, ham stretching  Rec deep massage, explained self-massage with ball Return to clinic in 4 weeks

## 2017-02-17 ENCOUNTER — Encounter: Payer: Self-pay | Admitting: Internal Medicine

## 2017-02-17 DIAGNOSIS — L920 Granuloma annulare: Secondary | ICD-10-CM | POA: Diagnosis not present

## 2017-02-17 DIAGNOSIS — M8580 Other specified disorders of bone density and structure, unspecified site: Secondary | ICD-10-CM | POA: Insufficient documentation

## 2017-02-21 ENCOUNTER — Encounter: Payer: Self-pay | Admitting: Internal Medicine

## 2017-02-21 DIAGNOSIS — G4733 Obstructive sleep apnea (adult) (pediatric): Secondary | ICD-10-CM | POA: Diagnosis not present

## 2017-02-24 ENCOUNTER — Telehealth: Payer: Self-pay | Admitting: Pulmonary Disease

## 2017-02-24 DIAGNOSIS — G4733 Obstructive sleep apnea (adult) (pediatric): Secondary | ICD-10-CM | POA: Diagnosis not present

## 2017-02-24 NOTE — Telephone Encounter (Signed)
HST 02/21/17 >> AHI 30.5, SaO2 low 78%   Will have my nurse inform pt that sleep study shows severe sleep apnea.  She should resume using auto CPAP, especially since untreated severe sleep apnea can negatively impact her heart function and blood pressure.  Please send order to her DME to refit her for nasal mask or nasal pillows mask.  Please also check whether her insurance company has less expensive alternative to spiriva.

## 2017-02-25 ENCOUNTER — Other Ambulatory Visit: Payer: Self-pay | Admitting: *Deleted

## 2017-02-25 ENCOUNTER — Other Ambulatory Visit (HOSPITAL_COMMUNITY): Payer: Self-pay | Admitting: *Deleted

## 2017-02-25 DIAGNOSIS — G4733 Obstructive sleep apnea (adult) (pediatric): Secondary | ICD-10-CM

## 2017-02-25 MED ORDER — APIXABAN 2.5 MG PO TABS: 2.5000 mg | ORAL_TABLET | Freq: Two times a day (BID) | ORAL | 3 refills | Status: DC

## 2017-03-02 DIAGNOSIS — Z961 Presence of intraocular lens: Secondary | ICD-10-CM | POA: Diagnosis not present

## 2017-03-02 DIAGNOSIS — Z947 Corneal transplant status: Secondary | ICD-10-CM | POA: Diagnosis not present

## 2017-03-02 DIAGNOSIS — H18423 Band keratopathy, bilateral: Secondary | ICD-10-CM | POA: Diagnosis not present

## 2017-03-02 DIAGNOSIS — E119 Type 2 diabetes mellitus without complications: Secondary | ICD-10-CM | POA: Diagnosis not present

## 2017-03-02 DIAGNOSIS — H26492 Other secondary cataract, left eye: Secondary | ICD-10-CM | POA: Diagnosis not present

## 2017-03-02 LAB — HM DIABETES EYE EXAM

## 2017-03-02 NOTE — Telephone Encounter (Signed)
[id number] x1 

## 2017-03-03 ENCOUNTER — Encounter: Payer: Self-pay | Admitting: Internal Medicine

## 2017-03-03 NOTE — Telephone Encounter (Signed)
Order has been placed per VS. Nothing further was needed.

## 2017-03-03 NOTE — Telephone Encounter (Signed)
Patient returning call - patient will unavailable until 10:30am to 2pm - she can be reached at [phone] -pr

## 2017-03-03 NOTE — Telephone Encounter (Signed)
Spoke with pt. She is aware of her home sleep study. States that she is eligible for a new machine through [facility]. Order will be placed.  VS - please advise on what auto settings you would like her machine set. Thanks.

## 2017-03-03 NOTE — Telephone Encounter (Signed)
Auto CPAP 5 to 15 cm H2O with heated humidity.

## 2017-03-04 ENCOUNTER — Other Ambulatory Visit: Payer: Self-pay

## 2017-03-04 MED ORDER — METHIMAZOLE 5 MG PO TABS: 5.0000 mg | ORAL_TABLET | ORAL | 2 refills | Status: DC

## 2017-03-07 ENCOUNTER — Other Ambulatory Visit (HOSPITAL_COMMUNITY): Payer: Self-pay | Admitting: Cardiology

## 2017-03-07 MED ORDER — FUROSEMIDE 40 MG PO TABS: 80.0000 mg | ORAL_TABLET | Freq: Every day | ORAL | 3 refills | Status: DC

## 2017-03-07 MED ORDER — POTASSIUM CHLORIDE CRYS ER 20 MEQ PO TBCR: EXTENDED_RELEASE_TABLET | ORAL | 3 refills | Status: DC

## 2017-03-07 MED ORDER — CARVEDILOL 12.5 MG PO TABS: 12.5000 mg | ORAL_TABLET | Freq: Two times a day (BID) | ORAL | 3 refills | Status: DC

## 2017-03-10 ENCOUNTER — Telehealth (HOSPITAL_COMMUNITY): Payer: Self-pay | Admitting: *Deleted

## 2017-03-10 NOTE — Telephone Encounter (Signed)
Patient called in asking about paper work for assistance with her Eliquis...Marland KitchenMarland KitchenHeather informed me that the paperwork was completed and mailed back to patient on Tuesday 3/13 as requested.  Patient is aware.  Eliquis samples requested for pick up on Tuesday 3/20.  Eliquis samples are up at front desk with patient's name on it, ready for pick up.  Medication Samples have been provided to the patient.  Drug name: Eliquis       Strength: 5 mg       Qty: 28  LOT: [id number] Exp.Date: March 2020  Dosing instructions: Take one tablet by mouth, Twice daily  The patient has been instructed regarding the correct time, dose, and frequency of taking this medication, including desired effects and most common side effects.   [name] S 3:01 PM 03/10/2017

## 2017-03-14 ENCOUNTER — Telehealth: Payer: Self-pay | Admitting: Internal Medicine

## 2017-03-14 ENCOUNTER — Telehealth: Payer: Self-pay | Admitting: Endocrinology

## 2017-03-14 MED ORDER — GABAPENTIN 100 MG PO CAPS: 100.0000 mg | ORAL_CAPSULE | Freq: Every day | ORAL | 0 refills | Status: DC

## 2017-03-14 MED ORDER — GLUCOSE BLOOD VI STRP: ORAL_STRIP | 3 refills | Status: DC

## 2017-03-14 NOTE — Telephone Encounter (Signed)
Pt called in to make sure we were aware she is using [facility] now and that she also needs her test strips sent in there.

## 2017-03-14 NOTE — Telephone Encounter (Signed)
Rx submitted to [facility].

## 2017-03-14 NOTE — Telephone Encounter (Signed)
Spoke with [facility], Pt last filled RX on 02/28/17. RX sent to mail order with not attached to not fill early. Pt is aware.

## 2017-03-14 NOTE — Telephone Encounter (Signed)
The patient wanted to make Dr.Burns aware that [facility] has sent over a med request and they havent received a response yet for: gabapentin (NEURONTIN) 100 MG capsule    [place], [place] [phone] (Phone) [phone] (Fax)

## 2017-03-15 ENCOUNTER — Other Ambulatory Visit: Payer: Self-pay

## 2017-03-15 MED ORDER — ONETOUCH ULTRASOFT LANCETS MISC: 2 refills | Status: DC

## 2017-03-15 NOTE — Progress Notes (Signed)
[facility] [place], [place] [address] Phone: [phone] Subjective:    I'm seeing this patient by the request  of:  [name], MD   CC: Left hip pain f/u   [id number]  [name] is a 83 y.o. female coming in with complaint of left hip pain. Patient was found to have more of a piriformis syndrome. Patient was given home exercises, icing, we discussed topical anti-inflammatories. Patient states that she is mainly a very mild improvement. States that sometimes still has the radiation going down the leg. Denies any associated back pain with it. Denies weakness. Denies any constant numbness. No real nighttime pain and seems to be worse with weightbearing than anything else.    patient's previous imaging in June 2017 patient's hips were independently visualized by me. Hip show the patient does have moderate osteophytic changes as well as moderate pubic symphysitis.   Past Medical History:  Diagnosis Date  . Anemia    iron defficiency  . Anginal pain ([place])   . Arthritis    "knees, feet, hands; joints" (05/27/2015)  . Asthma   . Atrial fibrillation or flutter    s/p RFCA 7/08;   s/p DCCV in past;   previously on amiodarone;  amio stopped due to lung toxicity  . CAD (coronary artery disease)    s/p NSTEMI tx with BMS to OM1 3/08;  cath 3/08: pOM 99% tx with PCI, pLAD 20%, ? mod stenosis at the AM  . CAP (community acquired pneumonia) 05/24/2015  . Chronic diastolic heart failure (HCC)    echo 11/11:  EF 55-60%, severe LVH, mod LAE, mild MR, mildly increased PASP  . COPD (chronic obstructive pulmonary disease) ([place])   . Degenerative joint disease   . Dystrophy, corneal stromal   . Heart murmur   . HLD (hyperlipidemia)   . HTN (hypertension)    essential nos  . Hypopotassemia    PMH of  . Muscle pain   . Myocardial infarction 2008  . OSA on CPAP   . Osteoporosis   . Pneumonia 05/27/2015  . Protein calorie malnutrition ([place])    . Rash and nonspecific skin eruption    both arms,awaiting bio   dr Delman Cheadle  . Seasonal allergies   . Shortness of breath dyspnea   . Type II diabetes mellitus ([place])    Dr Loanne Drilling   Past Surgical History:  Procedure Laterality Date  . A FLUTTER ABLATION     Dr Lovena Le  . CATARACT EXTRACTION W/ INTRAOCULAR LENS  IMPLANT, BILATERAL Bilateral   . CHOLECYSTECTOMY    . COLONOSCOPY  6/07   2 polyps Dr. Kinnie Feil in [place]  . colonoscopy with polypectomy      X 2; [facility]  . CORNEAL TRANSPLANT Bilateral   . CORONARY ANGIOPLASTY WITH STENT PLACEMENT  02/2007   BMS w/Dr Lovena Le  . EYE SURGERY    . gravid 2 para 2    . KNEE CARTILAGE SURGERY Right   . LEFT AND RIGHT HEART CATHETERIZATION WITH CORONARY ANGIOGRAM N/A 05/07/2014   Procedure: LEFT AND RIGHT HEART CATHETERIZATION WITH CORONARY ANGIOGRAM;  Surgeon: Peter M Martinique, MD;  Location[facility] CATH LAB;  Service: Cardiovascular;  Laterality: N/A;  . TOTAL KNEE ARTHROPLASTY Right 06/28/2016   Procedure: TOTAL KNEE ARTHROPLASTY;  Surgeon: Frederik Pear, MD;  Location: [facility];  Service: Orthopedics;  Laterality: Right;   Social History   Social History  . Marital status: Widowed  Spouse name: N/A  . Number of children: N/A  . Years of education: N/A   Occupational History  . Teacher    Social History Main Topics  . Smoking status: Former Smoker    Packs/day: 0.25    Years: 18.00    Types: Cigarettes    Quit date: 12/27/1968  . Smokeless tobacco: Never Used     Comment: smoked [place], up to 1 pp week  . Alcohol use Yes     Comment: occ  . Drug use: No  . Sexual activity: No   Other Topics Concern  . None   Social History Sports administrator. Widowed. Rarely drinks cafeine.    Allergies  Allergen Reactions  . Amlodipine Besylate Other (See Comments)    REACTION: tingling in lips & gum edema  . Levaquin [Levofloxacin] Shortness Of Breath and Swelling    angioedema  . Lobster [Shellfish Allergy] Other (See Comments)     angioedema  . Penicillins Rash  . Valsartan Other (See Comments)    REACTION: angioedema  . Codeine Other (See Comments)    Mental status changes  . Lipitor [Atorvastatin]     weakness  . Oxycodone   . Statins     Made her too weak  . Tramadol Hcl Nausea And Vomiting   Family History  Problem Relation Age of Onset  . Diabetes Mother   . Hypertension Mother   . Transient ischemic attack Mother   . Arthritis Mother   . Heart attack Father 19  . Arthritis Father   . Hypertension Father   . Breast cancer Maternal Aunt   . Arthritis Maternal Aunt     Past medical history, social, surgical and family history all reviewed in electronic medical record.  No pertanent information unless stated regarding to the chief complaint.   Review of Systems: No headache, visual changes, nausea, vomiting, diarrhea, constipation, dizziness, abdominal pain, skin rash, fevers, chills, night sweats, weight loss, swollen lymph nodes, body aches, joint swelling, chest pain, shortness of breath, mood changes.  +muscle aches  Objective  Blood pressure 118/66, pulse 60, height 5\' 3"  (1.6 m), weight 177 lb (80.3 kg).   Systems examined below as of 03/16/17 General: NAD A&O x3 mood, affect normal  HEENT: Pupils equal, extraocular movements intact no nystagmus Respiratory: not short of breath at rest or with speaking Cardiovascular: No lower extremity edema, non tender Skin: Warm dry intact with no signs of infection or rash on extremities or on axial skeleton. Abdomen: Soft nontender, no masses Neuro: Cranial nerves  intact, neurovascularly intact in all extremities with 2+ DTRs and 2+ pulses. Lymph: No lymphadenopathy appreciated today  Gait antalgic gait  MSK: Non tender with full range of motion and good stability and symmetric strength and tone of shoulders, elbows, wrist,  knee and ankles bilaterally.  Arthritic changes of multiple joints recent knee replacement with well-healed in full range of  motion. Length discrepancy noted with right leg being quarter inch shorter. Hip: Left ROM IR: 15 Deg, ER: 45 Deg, Flexion: 120 Deg, Extension: 100 Deg, Abduction: 45 Deg, Adduction: 25 Deg Strength Forward 5 strength but symmetric Pelvic alignment unremarkable to inspection and palpation. Standing hip rotation and gait without trendelenburg sign / unsteadiness. Greater trochanter without tenderness to palpation. Heart tenderness to palpation in the gluteal region.. Pain with [place]y Sox No SI joint tenderness and normal minimal SI movement. Contralateral hip unremarkable      Impression and Recommendations:     This case  required medical decision making of moderate complexity.      Note: This dictation was prepared with Dragon dictation along with smaller phrase technology. Any transcriptional errors that result from this process are unintentional.

## 2017-03-16 ENCOUNTER — Ambulatory Visit (INDEPENDENT_AMBULATORY_CARE_PROVIDER_SITE_OTHER): Payer: Medicare Other | Admitting: Family Medicine

## 2017-03-16 ENCOUNTER — Encounter: Payer: Self-pay | Admitting: Family Medicine

## 2017-03-16 VITALS — BP 118/66 | HR 60 | Ht 63.0 in | Wt 177.0 lb

## 2017-03-16 DIAGNOSIS — G5702 Lesion of sciatic nerve, left lower limb: Secondary | ICD-10-CM

## 2017-03-16 MED ORDER — GABAPENTIN 300 MG PO CAPS: 300.0000 mg | ORAL_CAPSULE | Freq: Every day | ORAL | 3 refills | Status: DC

## 2017-03-16 NOTE — Patient Instructions (Signed)
Good to see you  Increase gabapentin to 300mg  at night We will get a new prescriptin then when you are out and it will be 300mg  capsules.  Ice is your friend PT will be calling you  Continue the orthotic See me again in 4-6 weeks

## 2017-03-16 NOTE — Assessment & Plan Note (Signed)
Worsening symptoms. I still believe the patient is having more of a piriformis syndrome. We discussed with patient at great length. Increase gabapentin to 300 mg. Patient has known arthritic changes of the hip as well as the back that could also be contribute in. Encourage her to continue the over-the-counter medications. I do feel that formal physical therapy could be beneficial in patient was referred today. I do not feel that advance imaging would be warranted at this time. Patient continues to have pain we'll consider injections. Patient follow-up again in 4-6 weeks.

## 2017-03-18 DIAGNOSIS — G4733 Obstructive sleep apnea (adult) (pediatric): Secondary | ICD-10-CM | POA: Diagnosis not present

## 2017-03-23 ENCOUNTER — Ambulatory Visit: Payer: Medicare Other | Attending: Family Medicine | Admitting: Physical Therapy

## 2017-03-23 ENCOUNTER — Encounter: Payer: Self-pay | Admitting: Physical Therapy

## 2017-03-23 DIAGNOSIS — R293 Abnormal posture: Secondary | ICD-10-CM | POA: Insufficient documentation

## 2017-03-23 DIAGNOSIS — R262 Difficulty in walking, not elsewhere classified: Secondary | ICD-10-CM

## 2017-03-23 DIAGNOSIS — M6281 Muscle weakness (generalized): Secondary | ICD-10-CM | POA: Diagnosis not present

## 2017-03-23 DIAGNOSIS — M5442 Lumbago with sciatica, left side: Secondary | ICD-10-CM | POA: Insufficient documentation

## 2017-03-23 NOTE — Patient Instructions (Signed)
BACK: Forward Bend (Do in [place])     Exhaling, bend knees, bring chin to chest, and slowly roll down toward the floor. Stay in this position for _1__ breaths. Small range of motion. Repeat __10_ times. Do _3__ times per day.  Copyright  VHI. All rights reserved.  (Home) Side Bend: Thoracic With Flexion Same Side Rotation - Sitting    Sit with right side against chair, opposite hand behind head, elbow forward.  side bending to the right and open the left, small range of motion. Hold position for __1__ breaths.  Repeat _10___ times per set. Do ___1_ sets per session.    Copyright  VHI. All rights reserved.

## 2017-03-28 ENCOUNTER — Encounter: Payer: Self-pay | Admitting: Internal Medicine

## 2017-03-28 NOTE — Therapy (Signed)
[facility] [address], [place], [place], [address] Phone: [phone]   Fax:  [phone]  Physical Therapy Evaluation  Patient Details  Name: [name] MRN: [id number] Date of Birth: August 26, 1931 Referring Provider: Hulan Saas, MD  Encounter Date: 03/23/2017      PT End of Session - 03/28/17 1428    Visit Number 1   Number of Visits 10   Date for PT Re-Evaluation 05/18/17   Authorization Type gcodes 10th visit; KX at 55   PT Start Time 0850   PT Stop Time 0946   PT Time Calculation (min) 56 min   Activity Tolerance Patient tolerated treatment well   Behavior During Therapy [facility] for tasks assessed/performed      Past Medical History:  Diagnosis Date  . Anemia    iron defficiency  . Anginal pain ([place])   . Arthritis    "knees, feet, hands; joints" (05/27/2015)  . Asthma   . Atrial fibrillation or flutter    s/p RFCA 7/08;   s/p DCCV in past;   previously on amiodarone;  amio stopped due to lung toxicity  . CAD (coronary artery disease)    s/p NSTEMI tx with BMS to OM1 3/08;  cath 3/08: pOM 99% tx with PCI, pLAD 20%, ? mod stenosis at the AM  . CAP (community acquired pneumonia) 05/24/2015  . Chronic diastolic heart failure (HCC)    echo 11/11:  EF 55-60%, severe LVH, mod LAE, mild MR, mildly increased PASP  . COPD (chronic obstructive pulmonary disease) ([place])   . Degenerative joint disease   . Dystrophy, corneal stromal   . Heart murmur   . HLD (hyperlipidemia)   . HTN (hypertension)    essential nos  . Hypopotassemia    PMH of  . Muscle pain   . Myocardial infarction 2008  . OSA on CPAP   . Osteoporosis   . Pneumonia 05/27/2015  . Protein calorie malnutrition ([place])   . Rash and nonspecific skin eruption    both arms,awaiting bio   dr Delman Cheadle  . Seasonal allergies   . Shortness of breath dyspnea   . Type II diabetes mellitus ([place])    Dr Loanne Drilling    Past Surgical History:  Procedure  Laterality Date  . A FLUTTER ABLATION     Dr Lovena Le  . CATARACT EXTRACTION W/ INTRAOCULAR LENS  IMPLANT, BILATERAL Bilateral   . CHOLECYSTECTOMY    . COLONOSCOPY  6/07   2 polyps Dr. Kinnie Feil in [place]  . colonoscopy with polypectomy      X 2; [facility]  . CORNEAL TRANSPLANT Bilateral   . CORONARY ANGIOPLASTY WITH STENT PLACEMENT  02/2007   BMS w/Dr Lovena Le  . EYE SURGERY    . gravid 2 para 2    . KNEE CARTILAGE SURGERY Right   . LEFT AND RIGHT HEART CATHETERIZATION WITH CORONARY ANGIOGRAM N/A 05/07/2014   Procedure: LEFT AND RIGHT HEART CATHETERIZATION WITH CORONARY ANGIOGRAM;  Surgeon: Peter M Martinique, MD;  Location: [facility] CATH LAB;  Service: Cardiovascular;  Laterality: N/A;  . TOTAL KNEE ARTHROPLASTY Right 06/28/2016   Procedure: TOTAL KNEE ARTHROPLASTY;  Surgeon: Frederik Pear, MD;  Location: [place];  Service: Orthopedics;  Laterality: Right;    There were no vitals filed for this visit.       Subjective Assessment - 03/28/17 1427    Subjective I feel like I am using my left leg all the time since my knee replacement surgery.  Presents with single point cane and is using because of hip pain which began two months ago.  Has a heel lift in left shoe. Started using that a month ago and helped a little.   Pertinent History history of right TKA and abdominal surgery - cholecystectomy   How long can you stand comfortably? minimal   How long can you walk comfortably? minimal   Patient Stated Goals walking more and doing more around the house, walk around the grocery store   Currently in Pain? Yes   Pain Score 8    Pain Location Buttocks   Pain Orientation Left   Pain Descriptors / Indicators Burning   Pain Radiating Towards down thigh, lower leg, into toes on the top of the foot   Pain Onset More than a month ago   Pain Frequency Intermittent   Aggravating Factors  putting weight on leg   Pain Relieving Factors sitting   Effect of Pain on Daily Activities walk and standing are limited    Multiple Pain Sites No            [facility] PT Assessment - 03/28/17 0001      Assessment   Medical Diagnosis G57.02 piriformis syndrome, left   Referring Provider Hulan Saas, MD   Onset Date/Surgical Date --  July 2017   Prior Therapy no     Precautions   Precautions None     Restrictions   Weight Bearing Restrictions No     Home Environment   Living Environment Private residence   Living Arrangements Children  son   Home Layout One level     Prior Function   Level of Independence Independent     Cognition   Overall Cognitive Status Within Functional Limits for tasks assessed     Observation/Other Assessments   Focus on Therapeutic Outcomes (FOTO)  44% limitation  goal 36%     Posture/Postural Control   Posture/Postural Control Postural limitations   Postural Limitations Rounded Shoulders;Forward head;Increased thoracic kyphosis;Anterior pelvic tilt;Weight shift right     AROM   Overall AROM Comments lumbar flexion reduced by      Strength   Right Hip Flexion 4-/5   Right Hip External Rotation  3+/5   Right Hip Internal Rotation 4-/5   Right Hip ABduction 4-/5   Left Hip Flexion 3+/5   Left Hip External Rotation 4-/5   Left Hip Internal Rotation 4-/5   Left Hip ABduction 4/5     Ambulation/Gait   Ambulation/Gait Yes   Ambulation/Gait Assistance 6: Modified independent (Device/Increase time)   Assistive device Straight cane   Gait Pattern Lateral trunk lean to right;Antalgic;Step-to pattern                             PT Short Term Goals - 03/28/17 1134      PT SHORT TERM GOAL #1   Title Pt will be independent with initial HEP   Time 4   Period Weeks   Status New     PT SHORT TERM GOAL #2   Title Pt will be able to walk with upright posture with LRAD   Time 4   Period Weeks   Status New     PT SHORT TERM GOAL #3   Title Pt will report 25% less pain when standing and walking   Time 4   Period Weeks   Status New  PT Long Term Goals - 03/28/17 1135      PT LONG TERM GOAL #1   Title independent with advanced HEP   Time 8   Period Weeks   Status New     PT LONG TERM GOAL #2   Title 50% less pain during ambulation so she can walk around the grocery store   Time 8   Period Weeks   Status New     PT LONG TERM GOAL #3   Title FOTO < or = 36%   Time 8   Period Weeks   Status New     PT LONG TERM GOAL #4   Title LE MMT improved to 4/5 in all directions for improved gait and functional activities   Time 8   Period Weeks   Status New               Plan - 03/28/17 1429    Clinical Impression Statement Patient presents for moderate complexity eval due to complicating factors including history of right TKA and abdominal surgery - cholecystectomy, and worsening condition. PT assessed multiple body regions and pt has deficits with posture and gait demonstrating lateral lean. She currently uses straight cane which is not normal for her . She has weakness in LE Rt> Lt from 3+/5 . Pt reports her pain is 8/10 in left buttocks shooting down to left leg to knee. She has 50% limited lumbar ROM with increasing pain. Pt will benefit from skilled PT in order to address these impairments and return to maximum functional activities.   Rehab Potential Good   Clinical Impairments Affecting Rehab Potential history of right TKA and abdominal surgery - cholecystectomy   PT Frequency 2x / week   PT Duration 8 weeks   PT Treatment/Interventions ADLs/Self Care Home Management;Biofeedback;Cryotherapy;Electrical Stimulation;Iontophoresis 4mg /ml Dexamethasone;Moist Heat;Ultrasound;Gait training;Stair training;Therapeutic activities;Therapeutic exercise;Balance training;Neuromuscular re-education;Patient/family education;Manual techniques;Passive range of motion;Dry needling;Taping   PT Next Visit Plan posture and gait, manual, stim, review thoracic and lumbar ROM, core strength   Consulted and Agree with  Plan of Care Patient      Patient will benefit from skilled therapeutic intervention in order to improve the following deficits and impairments:  Abnormal gait, Decreased activity tolerance, Decreased coordination, Decreased endurance, Decreased mobility, Decreased range of motion, Decreased strength, Difficulty walking, Increased muscle spasms, Pain, Postural dysfunction, Improper body mechanics  Visit Diagnosis: Acute left-sided low back pain with left-sided sciatica - Plan: PT plan of care cert/re-cert  Muscle weakness (generalized) - Plan: PT plan of care cert/re-cert  Difficulty in walking, not elsewhere classified - Plan: PT plan of care cert/re-cert  Abnormal posture - Plan: PT plan of care cert/re-cert     Problem List Patient Active Problem List   Diagnosis Date Noted  . Osteopenia 02/17/2017  . Piriformis syndrome of left side 02/16/2017  . Acquired leg length discrepancy 02/16/2017  . COPD (chronic obstructive pulmonary disease) ([place]) 01/27/2017  . Lumbar radiculopathy, acute 01/27/2017  . Rash 01/27/2017  . Granuloma annulare 07/27/2016  . Numbness of fingers 07/27/2016  . Primary osteoarthritis of right knee 06/27/2016  . Diabetes ([place]) 03/07/2016  . Acute renal failure superimposed on stage 3 chronic kidney disease ([place])   . Sepsis ([place]) 05/24/2015  . OSA on CPAP 08/28/2014  . Chronic diastolic heart failure ([place]) 07/30/2014  . Nocturnal hypoxemia 07/09/2014  . Dyspnea 05/15/2014  . Pulmonary HTN 05/15/2014  . Crescendo angina ([place]) 05/02/2014  . Band keratopathy 01/16/2014  . 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  Epiphora due to insufficient drainage 06/13/2013  . Hyperthyroidism 11/04/2011  . Multinodular goiter 10/14/2011  . Atrophy, Fuchs' 09/29/2011  . ATRIAL FIBRILLATION 06/03/2010  . CHF 02/17/2010  . VITILIGO 11/07/2009  . SINOATRIAL NODE DYSFUNCTION 06/16/2009  . Iron deficiency anemia 01/02/2009  . ANEMIA, PERNICIOUS 11/20/2008  .  Atrial flutter ([place]) 06/25/2008  . HYPERLIPIDEMIA 01/29/2008  . Coronary atherosclerosis 01/29/2008  . HTN (hypertension) 10/30/2007    Zannie Cove, PT 03/28/2017, 2:30 PM  [facility] [address] [place], [place], [address] Phone: [phone]   Fax:  [phone]  Name: CHARLI HALLE MRN: [id number] Date of Birth: 15-Dec-1931

## 2017-03-30 ENCOUNTER — Ambulatory Visit: Payer: Medicare Other | Attending: Family Medicine

## 2017-03-30 DIAGNOSIS — R262 Difficulty in walking, not elsewhere classified: Secondary | ICD-10-CM | POA: Diagnosis not present

## 2017-03-30 DIAGNOSIS — R293 Abnormal posture: Secondary | ICD-10-CM | POA: Diagnosis not present

## 2017-03-30 DIAGNOSIS — M6281 Muscle weakness (generalized): Secondary | ICD-10-CM | POA: Diagnosis not present

## 2017-03-30 DIAGNOSIS — M5442 Lumbago with sciatica, left side: Secondary | ICD-10-CM

## 2017-03-30 NOTE — Therapy (Signed)
[facility] [address], [place], [place], [address] Phone: [phone]   Fax:  [phone]  Physical Therapy Treatment  Patient Details  Name: [name] MRN: [id number] Date of Birth: 1931/03/17 Referring Provider: Hulan Saas, MD  Encounter Date: 03/30/2017      PT End of Session - 03/30/17 1024    Visit Number 2   Number of Visits 10   Date for PT Re-Evaluation 05/18/17   Authorization Type gcodes 10th visit; KX at 9   PT Start Time 1019  pt. arrived late    PT Stop Time 1110  10 min moist heat to end tx   PT Time Calculation (min) 51 min   Activity Tolerance Patient tolerated treatment well   Behavior During Therapy [facility] for tasks assessed/performed      Past Medical History:  Diagnosis Date  . Anemia    iron defficiency  . Anginal pain ([place])   . Arthritis    "knees, feet, hands; joints" (05/27/2015)  . Asthma   . Atrial fibrillation or flutter    s/p RFCA 7/08;   s/p DCCV in past;   previously on amiodarone;  amio stopped due to lung toxicity  . CAD (coronary artery disease)    s/p NSTEMI tx with BMS to OM1 3/08;  cath 3/08: pOM 99% tx with PCI, pLAD 20%, ? mod stenosis at the AM  . CAP (community acquired pneumonia) 05/24/2015  . Chronic diastolic heart failure (HCC)    echo 11/11:  EF 55-60%, severe LVH, mod LAE, mild MR, mildly increased PASP  . COPD (chronic obstructive pulmonary disease) ([place])   . Degenerative joint disease   . Dystrophy, corneal stromal   . Heart murmur   . HLD (hyperlipidemia)   . HTN (hypertension)    essential nos  . Hypopotassemia    PMH of  . Muscle pain   . Myocardial infarction 2008  . OSA on CPAP   . Osteoporosis   . Pneumonia 05/27/2015  . Protein calorie malnutrition ([place])   . Rash and nonspecific skin eruption    both arms,awaiting bio   dr Delman Cheadle  . Seasonal allergies   . Shortness of breath dyspnea   . Type II diabetes mellitus ([place])    Dr  Loanne Drilling    Past Surgical History:  Procedure Laterality Date  . A FLUTTER ABLATION     Dr Lovena Le  . CATARACT EXTRACTION W/ INTRAOCULAR LENS  IMPLANT, BILATERAL Bilateral   . CHOLECYSTECTOMY    . COLONOSCOPY  6/07   2 polyps Dr. Kinnie Feil in [place]  . colonoscopy with polypectomy      X 2; [facility]  . CORNEAL TRANSPLANT Bilateral   . CORONARY ANGIOPLASTY WITH STENT PLACEMENT  02/2007   BMS w/Dr Lovena Le  . EYE SURGERY    . gravid 2 para 2    . KNEE CARTILAGE SURGERY Right   . LEFT AND RIGHT HEART CATHETERIZATION WITH CORONARY ANGIOGRAM N/A 05/07/2014   Procedure: LEFT AND RIGHT HEART CATHETERIZATION WITH CORONARY ANGIOGRAM;  Surgeon: Peter M Martinique, MD;  Location: [facility] CATH LAB;  Service: Cardiovascular;  Laterality: N/A;  . TOTAL KNEE ARTHROPLASTY Right 06/28/2016   Procedure: TOTAL KNEE ARTHROPLASTY;  Surgeon: Frederik Pear, MD;  Location: [facility];  Service: Orthopedics;  Laterality: Right;    There were no vitals filed for this visit.      Subjective Assessment - 03/30/17 1024    Subjective Pt. pain free initially however pain  still up to 8/10 with pressure.  Pt. reporting she is currently performing HEP activies however does have pain with bridge.  Pt. is currently attending chair yoga and water aerobics at [facility].     Patient Stated Goals walking more and doing more around the house, walk around the grocery store   Currently in Pain? No/denies   Pain Score 0-No pain   Multiple Pain Sites No                         [facility] Adult PT Treatment/Exercise - 03/30/17 1041      Exercises   Exercises Knee/Hip     Knee/Hip Exercises: Stretches   Passive Hamstring Stretch Left;30 seconds;3 reps   Passive Hamstring Stretch Limitations with therapist   last set with strap    Piriformis Stretch Left;30 seconds;4 reps   Piriformis Stretch Limitations modified and figure-4 with therapist; last 2 sets with strap   Other Knee/Hip Stretches L ITB stretch with therapist x 30 sec       Knee/Hip Exercises: Aerobic   Nustep Lvl 4, 6 min      Knee/Hip Exercises: Supine   Bridges AROM;10 reps  pain in L buttocks    Bridges Limitations with red TB around    [place] with Clamshell AROM;10 reps;1 set;Right;Left;Strengthening  Hooklying alternating hip abd/ER with red TB around knees    Other Supine Knee/Hip Exercises Sciatic nerve glides x 20 reps     Knee/Hip Exercises: Sidelying   Clams R sidelying L clam shell with red TB 2 x 10 reps; 1st set without TB     Modalities   Modalities Moist Heat     Moist Heat Therapy   Number Minutes Moist Heat 10 Minutes   Moist Heat Location Hip  L buttocks     Manual Therapy   Manual Therapy Soft tissue mobilization;Myofascial release   Manual therapy comments Strumming to L ITB in R sidelying with some TTP distally    Soft tissue mobilization L buttocks STM with red med ball (1000Gr) in R sidelying with bolster    Myofascial Release L piriformis TPR/STM in R sidelying with bolster                   PT Short Term Goals - 03/30/17 1026      PT SHORT TERM GOAL #1   Title Pt will be independent with initial HEP   Time 4   Period Weeks   Status On-going     PT SHORT TERM GOAL #2   Title Pt will be able to walk with upright posture with LRAD   Time 4   Period Weeks   Status On-going     PT SHORT TERM GOAL #3   Title Pt will report 25% less pain when standing and walking   Time 4   Period Weeks   Status On-going           PT Long Term Goals - 03/30/17 1332      PT LONG TERM GOAL #1   Title independent with advanced HEP   Time 8   Period Weeks   Status On-going     PT LONG TERM GOAL #2   Title 50% less pain during ambulation so she can walk around the grocery store   Time 8   Period Weeks   Status On-going     PT LONG TERM GOAL #3   Title FOTO < or = 36%  Time 8   Period Weeks   Status On-going     PT LONG TERM GOAL #4   Title LE MMT improved to 4/5 in all directions for improved gait and  functional activities   Time 8   Period Weeks   Status On-going               Plan - 03/30/17 1222    Clinical Impression Statement Pt. doing well today however still with pain rising up to 8/10 with pressure to L buttocks.  Today's treatment with manual STM/TPR to L buttocks and strumming to L ITB.  Pt. noting good relief with this.  TPR focusing on L buttocks/piriformis area with palpable muscular "knot" in this area.  Supine lumbopelvic strengthening to pt. tolerance today.  Increased pain with bridge, and sidelying clam with TB today.  Treatment ending with moist heat to L buttocks with good relief reported following this.  Pt. leaving therapy pain free.   PT Treatment/Interventions ADLs/Self Care Home Management;Biofeedback;Cryotherapy;Electrical Stimulation;Iontophoresis 4mg /ml Dexamethasone;Moist Heat;Ultrasound;Gait training;Stair training;Therapeutic activities;Therapeutic exercise;Balance training;Neuromuscular re-education;Patient/family education;Manual techniques;Passive range of motion;Dry needling;Taping   PT Next Visit Plan posture and gait, manual, stim, review thoracic and lumbar ROM, core strength      Patient will benefit from skilled therapeutic intervention in order to improve the following deficits and impairments:  Abnormal gait, Decreased activity tolerance, Decreased coordination, Decreased endurance, Decreased mobility, Decreased range of motion, Decreased strength, Difficulty walking, Increased muscle spasms, Pain, Postural dysfunction, Improper body mechanics  Visit Diagnosis: Acute left-sided low back pain with left-sided sciatica  Muscle weakness (generalized)  Difficulty in walking, not elsewhere classified  Abnormal posture     Problem List Patient Active Problem List   Diagnosis Date Noted  . Osteopenia 02/17/2017  . Piriformis syndrome of left side 02/16/2017  . Acquired leg length discrepancy 02/16/2017  . COPD (chronic obstructive  pulmonary disease) ([place]) 01/27/2017  . Lumbar radiculopathy, acute 01/27/2017  . Rash 01/27/2017  . Granuloma annulare 07/27/2016  . Numbness of fingers 07/27/2016  . Primary osteoarthritis of right knee 06/27/2016  . Diabetes ([place]) 03/07/2016  . Acute renal failure superimposed on stage 3 chronic kidney disease ([place])   . Sepsis ([place]) 05/24/2015  . OSA on CPAP 08/28/2014  . Chronic diastolic heart failure ([place]) 07/30/2014  . Nocturnal hypoxemia 07/09/2014  . Dyspnea 05/15/2014  . Pulmonary HTN 05/15/2014  . Crescendo angina ([place]) 05/02/2014  . Band keratopathy 01/16/2014  . 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 Epiphora due to insufficient drainage 06/13/2013  . Hyperthyroidism 11/04/2011  . Multinodular goiter 10/14/2011  . Atrophy, Fuchs' 09/29/2011  . ATRIAL FIBRILLATION 06/03/2010  . CHF 02/17/2010  . VITILIGO 11/07/2009  . SINOATRIAL NODE DYSFUNCTION 06/16/2009  . Iron deficiency anemia 01/02/2009  . ANEMIA, PERNICIOUS 11/20/2008  . Atrial flutter ([place]) 06/25/2008  . HYPERLIPIDEMIA 01/29/2008  . Coronary atherosclerosis 01/29/2008  . HTN (hypertension) 10/30/2007    Bess Harvest, PTA 03/30/17 1:43 PM   [facility] [address] [place], [place], [address] Phone: [phone]   Fax:  [phone]  Name: [name] MRN: [id number] Date of Birth: 07-27-1931

## 2017-04-01 ENCOUNTER — Telehealth (HOSPITAL_COMMUNITY): Payer: Self-pay | Admitting: Pharmacist

## 2017-04-01 NOTE — Telephone Encounter (Signed)
[employer] patient assistance denied for Eliquis. They need a pharmacy print out of what Ms. Ognibene has spent on her Rx's for 2018. She needs to have spent 3% of her annual income on Rx's to be approved. I have called and left VM to call me back with this info.   Ruta Hinds. Velva Harman, PharmD, BCPS, CPP Clinical Pharmacist Pager: [phone] Phone: [phone] 04/01/2017 10:03 AM

## 2017-04-04 ENCOUNTER — Ambulatory Visit: Payer: Medicare Other | Admitting: Physical Therapy

## 2017-04-04 ENCOUNTER — Encounter: Payer: Self-pay | Admitting: Physical Therapy

## 2017-04-04 ENCOUNTER — Telehealth: Payer: Self-pay | Admitting: Endocrinology

## 2017-04-04 DIAGNOSIS — R262 Difficulty in walking, not elsewhere classified: Secondary | ICD-10-CM

## 2017-04-04 DIAGNOSIS — M5442 Lumbago with sciatica, left side: Secondary | ICD-10-CM

## 2017-04-04 DIAGNOSIS — R293 Abnormal posture: Secondary | ICD-10-CM | POA: Diagnosis not present

## 2017-04-04 DIAGNOSIS — M6281 Muscle weakness (generalized): Secondary | ICD-10-CM

## 2017-04-04 MED ORDER — GLUCOSE BLOOD VI STRP: ORAL_STRIP | 3 refills | Status: DC

## 2017-04-04 NOTE — Telephone Encounter (Signed)
Pt called in and said that she needs the One Touch Verio Strips sent to the [facility].

## 2017-04-04 NOTE — Telephone Encounter (Signed)
Refill submitted. 

## 2017-04-04 NOTE — Therapy (Signed)
[facility] [address], [place], [place], [address] Phone: [phone]   Fax:  [phone]  Physical Therapy Treatment  Patient Details  Name: [name] MRN: [id number] Date of Birth: Jun 21, 1931 Referring Provider: Hulan Saas, MD  Encounter Date: 04/04/2017      PT End of Session - 04/04/17 1200    Visit Number 3   Number of Visits 10   Date for PT Re-Evaluation 05/18/17   Authorization Type gcodes 10th visit; KX at 67   PT Start Time 1157  PT late   PT Stop Time 1245   PT Time Calculation (min) 48 min   Activity Tolerance Patient tolerated treatment well   Behavior During Therapy [facility] for tasks assessed/performed      Past Medical History:  Diagnosis Date  . Anemia    iron defficiency  . Anginal pain ([place])   . Arthritis    "knees, feet, hands; joints" (05/27/2015)  . Asthma   . Atrial fibrillation or flutter    s/p RFCA 7/08;   s/p DCCV in past;   previously on amiodarone;  amio stopped due to lung toxicity  . CAD (coronary artery disease)    s/p NSTEMI tx with BMS to OM1 3/08;  cath 3/08: pOM 99% tx with PCI, pLAD 20%, ? mod stenosis at the AM  . CAP (community acquired pneumonia) 05/24/2015  . Chronic diastolic heart failure (HCC)    echo 11/11:  EF 55-60%, severe LVH, mod LAE, mild MR, mildly increased PASP  . COPD (chronic obstructive pulmonary disease) ([place])   . Degenerative joint disease   . Dystrophy, corneal stromal   . Heart murmur   . HLD (hyperlipidemia)   . HTN (hypertension)    essential nos  . Hypopotassemia    PMH of  . Muscle pain   . Myocardial infarction 2008  . OSA on CPAP   . Osteoporosis   . Pneumonia 05/27/2015  . Protein calorie malnutrition ([place])   . Rash and nonspecific skin eruption    both arms,awaiting bio   dr Delman Cheadle  . Seasonal allergies   . Shortness of breath dyspnea   . Type II diabetes mellitus ([place])    Dr Loanne Drilling    Past Surgical History:   Procedure Laterality Date  . A FLUTTER ABLATION     Dr Lovena Le  . CATARACT EXTRACTION W/ INTRAOCULAR LENS  IMPLANT, BILATERAL Bilateral   . CHOLECYSTECTOMY    . COLONOSCOPY  6/07   2 polyps Dr. Kinnie Feil in [place]  . colonoscopy with polypectomy      X 2; [facility]  . CORNEAL TRANSPLANT Bilateral   . CORONARY ANGIOPLASTY WITH STENT PLACEMENT  02/2007   BMS w/Dr Lovena Le  . EYE SURGERY    . gravid 2 para 2    . KNEE CARTILAGE SURGERY Right   . LEFT AND RIGHT HEART CATHETERIZATION WITH CORONARY ANGIOGRAM N/A 05/07/2014   Procedure: LEFT AND RIGHT HEART CATHETERIZATION WITH CORONARY ANGIOGRAM;  Surgeon: Peter M Martinique, MD;  Location: [facility] CATH LAB;  Service: Cardiovascular;  Laterality: N/A;  . TOTAL KNEE ARTHROPLASTY Right 06/28/2016   Procedure: TOTAL KNEE ARTHROPLASTY;  Surgeon: Frederik Pear, MD;  Location: [facility];  Service: Orthopedics;  Laterality: Right;    There were no vitals filed for this visit.      Subjective Assessment - 04/04/17 1158    Subjective My hip hurts when I stand on it and my lower leg is burning.  Currently in Pain? Yes   Pain Score --  7/10 LT hip, lower leg burns   Pain Location Hip   Pain Orientation Left   Pain Descriptors / Indicators Sore;Burning   Aggravating Factors  Weightbearing   Pain Relieving Factors Non weight bearing   Multiple Pain Sites No                         [facility] Adult PT Treatment/Exercise - 04/04/17 0001      Knee/Hip Exercises: Stretches   Active Hamstring Stretch Left;3 reps;10 seconds   Piriformis Stretch Left;3 reps;10 seconds   Other Knee/Hip Stretches Single knee to chest 3x 20 se   Other Knee/Hip Stretches Lower trunk rotation 20x slow and steady side to side     Knee/Hip Exercises: Aerobic   Nustep L 1 x 7 min  Concurrent review of status with pt     Knee/Hip Exercises: Supine   Quad Sets --  add/ TA/ glutes cocontraction 6x2   Bridges with Clamshell Strengthening;Both;2 sets;10 reps  VC to initiate with  TA contraction, slow speed     Knee/Hip Exercises: Sidelying   Clams LT 10x no resistance  VC/TC on technique,      Moist Heat Therapy   Number Minutes Moist Heat 15 Minutes   Moist Heat Location Hip     Electrical Stimulation   Electrical Stimulation Location LT lateral gluteals   Electrical Stimulation Action IFC   Electrical Stimulation Parameters 80-150 HZ  Pt sidelying RT   Electrical Stimulation Goals Pain                  PT Short Term Goals - 04/04/17 1200      PT SHORT TERM GOAL #1   Title Pt will be independent with initial HEP   Time 4   Period Weeks   Status On-going     PT SHORT TERM GOAL #2   Title Pt will be able to walk with upright posture with LRAD   Time 4   Period Weeks   Status On-going  Ambulates flexed at the trunk     PT SHORT TERM GOAL #3   Title Pt will report 25% less pain when standing and walking   Time 4   Period Weeks   Status On-going  No change, still early           PT Long Term Goals - 03/30/17 1332      PT LONG TERM GOAL #1   Title independent with advanced HEP   Time 8   Period Weeks   Status On-going     PT LONG TERM GOAL #2   Title 50% less pain during ambulation so she can walk around the grocery store   Time 8   Period Weeks   Status On-going     PT LONG TERM GOAL #3   Title FOTO < or = 36%   Time 8   Period Weeks   Status On-going     PT LONG TERM GOAL #4   Title LE MMT improved to 4/5 in all directions for improved gait and functional activities   Time 8   Period Weeks   Status On-going               Plan - 04/04/17 1200    Clinical Impression Statement Pt continues with Lt hip pain during weightbearing. Pt needed tactile cuing to perform S-L clams without pain and correctly.  Rehab Potential Good   Clinical Impairments Affecting Rehab Potential history of right TKA and abdominal surgery - cholecystectomy   PT Frequency 2x / week   PT Duration 8 weeks   PT  Treatment/Interventions ADLs/Self Care Home Management;Biofeedback;Cryotherapy;Electrical Stimulation;Iontophoresis 4mg /ml Dexamethasone;Moist Heat;Ultrasound;Gait training;Stair training;Therapeutic activities;Therapeutic exercise;Balance training;Neuromuscular re-education;Patient/family education;Manual techniques;Passive range of motion;Dry needling;Taping   PT Next Visit Plan posture and gait, manual, stim, review thoracic and lumbar ROM, core strength   Consulted and Agree with Plan of Care --      Patient will benefit from skilled therapeutic intervention in order to improve the following deficits and impairments:  Abnormal gait, Decreased activity tolerance, Decreased coordination, Decreased endurance, Decreased mobility, Decreased range of motion, Decreased strength, Difficulty walking, Increased muscle spasms, Pain, Postural dysfunction, Improper body mechanics  Visit Diagnosis: Acute left-sided low back pain with left-sided sciatica  Muscle weakness (generalized)  Difficulty in walking, not elsewhere classified  Abnormal posture     Problem List Patient Active Problem List   Diagnosis Date Noted  . Osteopenia 02/17/2017  . Piriformis syndrome of left side 02/16/2017  . Acquired leg length discrepancy 02/16/2017  . COPD (chronic obstructive pulmonary disease) ([place]) 01/27/2017  . Lumbar radiculopathy, acute 01/27/2017  . Rash 01/27/2017  . Granuloma annulare 07/27/2016  . Numbness of fingers 07/27/2016  . Primary osteoarthritis of right knee 06/27/2016  . Diabetes ([place]) 03/07/2016  . Acute renal failure superimposed on stage 3 chronic kidney disease ([place])   . Sepsis ([place]) 05/24/2015  . OSA on CPAP 08/28/2014  . Chronic diastolic heart failure ([place]) 07/30/2014  . Nocturnal hypoxemia 07/09/2014  . Dyspnea 05/15/2014  . Pulmonary HTN 05/15/2014  . Crescendo angina ([place]) 05/02/2014  . Band keratopathy 01/16/2014  . 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 Epiphora  due to insufficient drainage 06/13/2013  . Hyperthyroidism 11/04/2011  . Multinodular goiter 10/14/2011  . Atrophy, Fuchs' 09/29/2011  . ATRIAL FIBRILLATION 06/03/2010  . CHF 02/17/2010  . VITILIGO 11/07/2009  . SINOATRIAL NODE DYSFUNCTION 06/16/2009  . Iron deficiency anemia 01/02/2009  . ANEMIA, PERNICIOUS 11/20/2008  . Atrial flutter ([place]) 06/25/2008  . HYPERLIPIDEMIA 01/29/2008  . Coronary atherosclerosis 01/29/2008  . HTN (hypertension) 10/30/2007    Izaac Reisig, PTA 04/04/2017, 12:33 PM  [facility] [address] [place], [place], [address] Phone: [phone]   Fax:  [phone]  Name: [name] MRN: [id number] Date of Birth: 1931/09/23

## 2017-04-06 ENCOUNTER — Telehealth (HOSPITAL_COMMUNITY): Payer: Self-pay

## 2017-04-06 ENCOUNTER — Ambulatory Visit: Payer: Medicare Other | Admitting: Physical Therapy

## 2017-04-06 ENCOUNTER — Encounter: Payer: Self-pay | Admitting: Physical Therapy

## 2017-04-06 DIAGNOSIS — R293 Abnormal posture: Secondary | ICD-10-CM

## 2017-04-06 DIAGNOSIS — M5442 Lumbago with sciatica, left side: Secondary | ICD-10-CM | POA: Diagnosis not present

## 2017-04-06 DIAGNOSIS — R262 Difficulty in walking, not elsewhere classified: Secondary | ICD-10-CM | POA: Diagnosis not present

## 2017-04-06 DIAGNOSIS — M6281 Muscle weakness (generalized): Secondary | ICD-10-CM

## 2017-04-06 NOTE — Telephone Encounter (Signed)
Patient called to request samples of eliquis 2.5 mg tablets due to inability to afford and recent denial from [employer] until she pays 3% of Rx for 2018. Will leave 1 month supply at front desk of CHF clinic office. Patient aware and appreciative.  [name], RN

## 2017-04-06 NOTE — Therapy (Signed)
[facility] [address], [place], [place], [address] Phone: [phone]   Fax:  [phone]  Physical Therapy Treatment  Patient Details  Name: [name] MRN: [id number] Date of Birth: December 29, 1930 Referring Provider: Hulan Saas, MD  Encounter Date: 04/06/2017      PT End of Session - 04/06/17 1152    Visit Number 4   Number of Visits 10   Date for PT Re-Evaluation 05/18/17   Authorization Type gcodes 10th visit; KX at 53   PT Start Time 1150   PT Stop Time 1240   PT Time Calculation (min) 50 min   Activity Tolerance Patient tolerated treatment well   Behavior During Therapy [facility] for tasks assessed/performed      Past Medical History:  Diagnosis Date  . Anemia    iron defficiency  . Anginal pain ([place])   . Arthritis    "knees, feet, hands; joints" (05/27/2015)  . Asthma   . Atrial fibrillation or flutter    s/p RFCA 7/08;   s/p DCCV in past;   previously on amiodarone;  amio stopped due to lung toxicity  . CAD (coronary artery disease)    s/p NSTEMI tx with BMS to OM1 3/08;  cath 3/08: pOM 99% tx with PCI, pLAD 20%, ? mod stenosis at the AM  . CAP (community acquired pneumonia) 05/24/2015  . Chronic diastolic heart failure (HCC)    echo 11/11:  EF 55-60%, severe LVH, mod LAE, mild MR, mildly increased PASP  . COPD (chronic obstructive pulmonary disease) ([place])   . Degenerative joint disease   . Dystrophy, corneal stromal   . Heart murmur   . HLD (hyperlipidemia)   . HTN (hypertension)    essential nos  . Hypopotassemia    PMH of  . Muscle pain   . Myocardial infarction 2008  . OSA on CPAP   . Osteoporosis   . Pneumonia 05/27/2015  . Protein calorie malnutrition ([place])   . Rash and nonspecific skin eruption    both arms,awaiting bio   dr Delman Cheadle  . Seasonal allergies   . Shortness of breath dyspnea   . Type II diabetes mellitus ([place])    Dr Loanne Drilling    Past Surgical History:  Procedure  Laterality Date  . A FLUTTER ABLATION     Dr Lovena Le  . CATARACT EXTRACTION W/ INTRAOCULAR LENS  IMPLANT, BILATERAL Bilateral   . CHOLECYSTECTOMY    . COLONOSCOPY  6/07   2 polyps Dr. Kinnie Feil in [place]  . colonoscopy with polypectomy      X 2; [facility]  . CORNEAL TRANSPLANT Bilateral   . CORONARY ANGIOPLASTY WITH STENT PLACEMENT  02/2007   BMS w/Dr Lovena Le  . EYE SURGERY    . gravid 2 para 2    . KNEE CARTILAGE SURGERY Right   . LEFT AND RIGHT HEART CATHETERIZATION WITH CORONARY ANGIOGRAM N/A 05/07/2014   Procedure: LEFT AND RIGHT HEART CATHETERIZATION WITH CORONARY ANGIOGRAM;  Surgeon: Peter M Martinique, MD;  Location: [facility] C[facility];  Service: Cardiovascular;  Laterality: N/A;  . TOTAL KNEE ARTHROPLASTY Right 06/28/2016   Procedure: TOTAL KNEE ARTHROPLASTY;  Surgeon: Frederik Pear, MD;  Location: [facility];  Service: Orthopedics;  Laterality: Right;    There were no vitals filed for this visit.      Subjective Assessment - 04/06/17 1154    Subjective Going to gym, hip still hurts with weightbearing. Non-weightbearing exercises do not increase hip pain.    Currently  in Pain? --  Seated no pain, walking into clinic 7/10   Pain Location Hip   Pain Orientation Left   Pain Descriptors / Indicators Sore;Aching   Multiple Pain Sites No                         [facility] Adult PT Treatment/Exercise - 04/06/17 0001      Knee/Hip Exercises: Stretches   Active Hamstring Stretch Left;3 reps;10 seconds  Seated   Piriformis Stretch Left;3 reps;10 seconds   Other Knee/Hip Stretches Single knee to chest 3x 20 se     Knee/Hip Exercises: Aerobic   Nustep L2 x 8 min  Concurrent review of status.      Knee/Hip Exercises: Seated   Ball Squeeze With posture emphasis and concurrnet TA contraction 2 x10    Clamshell with TheraBand Red  Concurrent TA/posture focus 2x 15   Knee/Hip Flexion 10 x alternating with TA contraction      Moist Heat Therapy   Number Minutes Moist Heat 15 Minutes    Moist Heat Location Hip     Electrical Stimulation   Electrical Stimulation Location LT lateral gluteals   Electrical Stimulation Action IFC   Electrical Stimulation Parameters 80-150 HZ   Electrical Stimulation Goals Pain                  PT Short Term Goals - 04/04/17 1200      PT SHORT TERM GOAL #1   Title Pt will be independent with initial HEP   Time 4   Period Weeks   Status On-going     PT SHORT TERM GOAL #2   Title Pt will be able to walk with upright posture with LRAD   Time 4   Period Weeks   Status On-going  Ambulates flexed at the trunk     PT SHORT TERM GOAL #3   Title Pt will report 25% less pain when standing and walking   Time 4   Period Weeks   Status On-going  No change, still early           PT Long Term Goals - 03/30/17 1332      PT LONG TERM GOAL #1   Title independent with advanced HEP   Time 8   Period Weeks   Status On-going     PT LONG TERM GOAL #2   Title 50% less pain during ambulation so she can walk around the grocery store   Time 8   Period Weeks   Status On-going     PT LONG TERM GOAL #3   Title FOTO < or = 36%   Time 8   Period Weeks   Status On-going     PT LONG TERM GOAL #4   Title LE MMT improved to 4/5 in all directions for improved gait and functional activities   Time 8   Period Weeks   Status On-going               Plan - 04/06/17 1153    Clinical Impression Statement Pt trying hard to work on her standing posture. This is difficult she reports becasuse of the hip pain.  Pain with weight bearing at all distances, this is unchanged.  Pt was able to perform all strength exercises in sitting without any hip pain, did have some LE lateral burning during clamshells. She reports the [employer] does give her pain relief.    Rehab Potential Good  Clinical Impairments Affecting Rehab Potential history of right TKA and abdominal surgery - cholecystectomy   PT Frequency 2x / week   PT Duration 8 weeks   PT  Treatment/Interventions ADLs/Self Care Home Management;Biofeedback;Cryotherapy;Electrical Stimulation;Iontophoresis 4mg /ml Dexamethasone;Moist Heat;Ultrasound;Gait training;Stair training;Therapeutic activities;Therapeutic exercise;Balance training;Neuromuscular re-education;Patient/family education;Manual techniques;Passive range of motion;Dry needling;Taping   PT Next Visit Plan Seated hip strength, flexibility, Nustep, modalities.   Consulted and Agree with Plan of Care --      Patient will benefit from skilled therapeutic intervention in order to improve the following deficits and impairments:  Abnormal gait, Decreased activity tolerance, Decreased coordination, Decreased endurance, Decreased mobility, Decreased range of motion, Decreased strength, Difficulty walking, Increased muscle spasms, Pain, Postural dysfunction, Improper body mechanics  Visit Diagnosis: Acute left-sided low back pain with left-sided sciatica  Muscle weakness (generalized)  Difficulty in walking, not elsewhere classified  Abnormal posture     Problem List Patient Active Problem List   Diagnosis Date Noted  . Osteopenia 02/17/2017  . Piriformis syndrome of left side 02/16/2017  . Acquired leg length discrepancy 02/16/2017  . COPD (chronic obstructive pulmonary disease) ([place]) 01/27/2017  . Lumbar radiculopathy, acute 01/27/2017  . Rash 01/27/2017  . Granuloma annulare 07/27/2016  . Numbness of fingers 07/27/2016  . Primary osteoarthritis of right knee 06/27/2016  . Diabetes ([place]) 03/07/2016  . Acute renal failure superimposed on stage 3 chronic kidney disease ([place])   . Sepsis ([place]) 05/24/2015  . OSA on CPAP 08/28/2014  . Chronic diastolic heart failure ([place]) 07/30/2014  . Nocturnal hypoxemia 07/09/2014  . Dyspnea 05/15/2014  . Pulmonary HTN 05/15/2014  . Crescendo angina ([place]) 05/02/2014  . Band keratopathy 01/16/2014  . 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 Epiphora due to insufficient  drainage 06/13/2013  . Hyperthyroidism 11/04/2011  . Multinodular goiter 10/14/2011  . Atrophy, Fuchs' 09/29/2011  . ATRIAL FIBRILLATION 06/03/2010  . CHF 02/17/2010  . VITILIGO 11/07/2009  . SINOATRIAL NODE DYSFUNCTION 06/16/2009  . Iron deficiency anemia 01/02/2009  . ANEMIA, PERNICIOUS 11/20/2008  . Atrial flutter ([place]) 06/25/2008  . HYPERLIPIDEMIA 01/29/2008  . Coronary atherosclerosis 01/29/2008  . HTN (hypertension) 10/30/2007    Hart Haas, PTA 04/06/2017, 2:03 PM  [facility] [address], [place], [place], [address] Phone: [phone]   Fax:  [phone]  Name: [name] MRN: [id number] Date of Birth: 1931/10/09

## 2017-04-11 ENCOUNTER — Encounter (HOSPITAL_COMMUNITY): Payer: Self-pay | Admitting: Internal Medicine

## 2017-04-11 ENCOUNTER — Ambulatory Visit (HOSPITAL_COMMUNITY)
Admission: RE | Admit: 2017-04-11 | Discharge: 2017-04-11 | Disposition: A | Discharge status: Home / Self Care | Payer: Medicare Other | Source: Ambulatory Visit | Attending: Internal Medicine | Admitting: Internal Medicine

## 2017-04-11 VITALS — BP 134/82 | HR 76 | Wt 183.8 lb

## 2017-04-11 DIAGNOSIS — G4733 Obstructive sleep apnea (adult) (pediatric): Secondary | ICD-10-CM | POA: Diagnosis not present

## 2017-04-11 DIAGNOSIS — I5043 Acute on chronic combined systolic (congestive) and diastolic (congestive) heart failure: Secondary | ICD-10-CM | POA: Insufficient documentation

## 2017-04-11 DIAGNOSIS — Z794 Long term (current) use of insulin: Secondary | ICD-10-CM | POA: Insufficient documentation

## 2017-04-11 DIAGNOSIS — I272 Pulmonary hypertension, unspecified: Secondary | ICD-10-CM | POA: Insufficient documentation

## 2017-04-11 DIAGNOSIS — I481 Persistent atrial fibrillation: Secondary | ICD-10-CM | POA: Diagnosis not present

## 2017-04-11 DIAGNOSIS — I4892 Unspecified atrial flutter: Secondary | ICD-10-CM | POA: Insufficient documentation

## 2017-04-11 DIAGNOSIS — I5032 Chronic diastolic (congestive) heart failure: Secondary | ICD-10-CM

## 2017-04-11 DIAGNOSIS — I2510 Atherosclerotic heart disease of native coronary artery without angina pectoris: Secondary | ICD-10-CM | POA: Insufficient documentation

## 2017-04-11 DIAGNOSIS — Z9111 Patient's noncompliance with dietary regimen: Secondary | ICD-10-CM | POA: Diagnosis not present

## 2017-04-11 DIAGNOSIS — I10 Essential (primary) hypertension: Secondary | ICD-10-CM

## 2017-04-11 DIAGNOSIS — Z880 Allergy status to penicillin: Secondary | ICD-10-CM | POA: Diagnosis not present

## 2017-04-11 DIAGNOSIS — I110 Hypertensive heart disease with heart failure: Secondary | ICD-10-CM | POA: Diagnosis not present

## 2017-04-11 DIAGNOSIS — Z7901 Long term (current) use of anticoagulants: Secondary | ICD-10-CM | POA: Diagnosis not present

## 2017-04-11 DIAGNOSIS — I482 Chronic atrial fibrillation, unspecified: Secondary | ICD-10-CM

## 2017-04-11 DIAGNOSIS — Z79899 Other long term (current) drug therapy: Secondary | ICD-10-CM | POA: Diagnosis not present

## 2017-04-11 DIAGNOSIS — Z888 Allergy status to other drugs, medicaments and biological substances status: Secondary | ICD-10-CM | POA: Diagnosis not present

## 2017-04-11 NOTE — Progress Notes (Signed)
Patient ID: GISSELL [name], female   DOB: 29-Mar-1931, 83 y.o.   MRN: [id number]   ADVANCED HF CLINIC NOTE  Patient ID: [name], female   DOB: [birth year], 83 y.o.   MRN: [id number]  Referring Physician: Lovena [name] Primary Care: [name] Primary Cardiologist:[name]  HPI: [name] is a pleasant 83 yo woman with CAD, HTN, atrial fibrillation/A flutter and chronic prior systolic heart failure, (which was likely rate related) and diastolic heart failure. She was referred by Dr. Lovena [name] for further evaluation of Pulmonary HTN.   Underwent 2D echo in 3/15 which showed EF 60-65% mild LVH. RV was normal. No significant valvular abnormalities.   Underwent R/L cath 05/07/14 Which showed stable CAD and significant PH  RA 6 RV 71/7 6 PA 63/17 (33)  PCWP 19 mm Hg  LV 174/16 mm Hg  AO 171/62 mean 103 mm Hg  PVR = 2.86 Oxygen saturations:  PA 69%  AO 99%  Cardiac Output/Index (Fick) 4.88/2.7    Left mainstem: Normal.  Left anterior descending (LAD): Moderate calcification proximally. Mild irregularities less than 10%. The first diagonal has 40-50% ostial disease.  Left circumflex (LCx): The LCx gives rise to a large OM1 then terminates in the AV groove. There is 20% disease in the proximal LCx. The first OM stent is patent with diffuse 20% disease.  Right coronary artery (RCA): The RCA arises anteriorly. There is an eccentric slit like stenosis in the proximal vessel to 60-70%. The mid vessel has segmental 70-80% disease.  Left ventriculography: Left ventricular systolic function is normal, LVEF is estimated at 55-65%, there is no significant mitral regurgitation   We saw her for an initial visit in May 2105 for pHTN and DOE. At that time she was quite dyspneic with activity. HR was in 40-50 range on carvedilol 50 bid and digoxin. We stopped her digoxin and cut carvedilol back to 25 bid. PFTs, VQ and CXR were ordered. I also asked her to decrease her H2O intake. We saw her back last  month and she was feeling much better. Given results of PFTs overnight oximeter was placed and referred to Dr. Gwenette [name]. She has had overnight oximetry which showed oxygen desaturation as low as 83%, and spent 97 minutes less than 88% during the night.   Follow-up:  Here for routine f/u. Over past 2 weeks. Has been more SOB. Weight up about 5 pounds. Says recently started on gabapentin for leg pain. Had some orthopnea last night. Trying to wear CPAP but struggling with device. Saw Dr. Halford [name] and had another home sleep study with severe OSA. Has not taken extra lasix. Says she is watching what she eats.    Results:  05/21/14: VQ normal 5/15: CXR: 3/16: Echo 3/16 EF 60-65% RV normal. Mild TR No RVSP measured   Emphysema. Otherwise normal.  5/15: PFTs FEV1  1.4 (95%) FVC    1.89 (110%) FEV/FVC 88% FEF 25-75%  66% DLCO 39%  BP rest: 138/58 BP peak: 182/66 Peak VO2: 8.9 (69.1% predicted peak VO2) VE/VCO2 slope: 34.6 OUES: Peak RER: 1.24 Ventilatory Threshold: 5.8 (50% predicted and 65% measured peak VO2) Peak RR 38 Peak Ventilation: 32.7 VE/MVV: 71%  PETCO2 at peak: 36 O2pulse: 8 (100% predicted O2pulse)     Labs  05/23/15  Creatinine 1.2 05/31/15    Creatinine 1.65 9/17  Creatinine 0.98 k 3.8    Current Outpatient Prescriptions  Medication Sig Dispense Refill  . apixaban (ELIQUIS) 5 MG TABS tablet Take 5 mg by mouth 2 (  two) times daily.    . carvedilol (COREG) 12.5 MG tablet Take 1 tablet (12.5 mg total) by mouth 2 (two) times daily with a meal. 180 tablet 3  . clotrimazole (LOTRIMIN) 1 % cream Apply 1 application topically 2 (two) times daily. To inner thighs 30 g 0  . diclofenac sodium (VOLTAREN) 1 % GEL Apply 2 g topically 2 (two) times daily as needed (for pain).    . furosemide (LASIX) 40 MG tablet Take 2 tablets (80 mg total) by mouth daily. 180 tablet 3  . gabapentin (NEURONTIN) 300 MG capsule Take 1 capsule (300 mg total) by mouth at bedtime. If tolerated after two days  increase to 200 mg nightly 30 capsule 3  . glucose blood (ONE TOUCH ULTRA TEST) test strip USE TO CHECK BLOOD SUGAR 2 TIMES PER DAY 200 each 3  . ibuprofen (ADVIL,MOTRIN) 200 MG tablet Take 400 mg by mouth every 6 (six) hours as needed.    . insulin NPH Human (HUMULIN N,NOVOLIN N) 100 UNIT/ML injection Inject 0.42 mLs (42 Units total) into the skin daily. 20 mL 11  . Lancets (ONETOUCH ULTRASOFT) lancets Use to check blood sugar 2 times per day. Dx Code E11.9 200 each 2  . methimazole (TAPAZOLE) 5 MG tablet Take 1 tablet (5 mg total) by mouth 2 (two) times a week. Wednesday and Friday. 40 tablet 2  . Multiple Vitamins-Minerals (CENTRUM SILVER ULTRA WOMENS PO) Take 1 tablet by mouth daily.     . potassium chloride SA (K-DUR,KLOR-CON) 20 MEQ tablet Take 1 tablet (56mEq) daily every other day alternating with 2 tablets (50mEq) daily every other day 135 tablet 3  . Turmeric 500 MG CAPS Take 500 mg by mouth daily.    . Vitamin D, Cholecalciferol, 1000 units TABS Take 2,000 Units by mouth daily.     . nitroGLYCERIN (NITROSTAT) 0.4 MG SL tablet Place 0.4 mg under the tongue every 5 (five) minutes as needed for chest pain. Reported on 01/28/2016     No current facility-administered medications for this encounter.     Allergies  Allergen Reactions  . Amlodipine Besylate Other (See Comments)    REACTION: tingling in lips & gum edema  . Levaquin [Levofloxacin] Shortness Of Breath and Swelling    angioedema  . Lobster [Shellfish Allergy] Other (See Comments)    angioedema  . Penicillins Rash  . Valsartan Other (See Comments)    REACTION: angioedema  . Codeine Other (See Comments)    Mental status changes  . Lipitor [Atorvastatin]     weakness  . Oxycodone   . Statins     Made her too weak  . Tramadol Hcl Nausea And Vomiting   PHYSICAL EXAM: Vitals:   04/11/17 1217  BP: 134/82  Pulse: 76  SpO2: 91%  Weight: 183 lb 12 oz (83.3 kg)   Wt Readings from Last 3 Encounters:  04/11/17 183 lb 12  oz (83.3 kg)  03/16/17 177 lb (80.3 kg)  02/16/17 179 lb (81.2 kg)    General:  Looks younger than stated age. Well appearing. No respiratory difficulty HEENT: normal anicteric  Neck: supple. JVP 9 Carotids 2+ bilat; no bruits. No lymphadenopathy or thryomegaly appreciated. Cor: PMI nondisplaced.  Irregular rate & rhythm.  Lungs: Minimal basilar crackles. No wheeze Abdomen: soft, NT/ND. No hepatosplenomegaly. No bruits or masses. Good bowel sounds Extremities: no cyanosis, clubbing, rash, 1+ edema Neuro: alert & oriented x 3, cranial nerves grossly intact. moves all 4 extremities w/o difficulty. Affect pleasant  ASSESSMENT & PLAN: 1) Acute on chronic diastolic HF with mild pulmonary venous HTN  -- Volume status back up in setting of dietary non-compliance. NYHA III -- Will have her double lasix to 80 bid for 2 days with extra KCL. If not responding will call us and can use metolazone -- Reinforced need for daily weights and reviewed use of sliding scale diuretics. -- Will place REDS Vest 2) Persistent atrial fib/flutter    This patients CHA2DS2-VASc Score and unadjusted Ischemic Stroke Rate (% per year) is equal to 7.2 % stroke rate/year from a score of 5 --Doing well with rate control. On Eliquis 5 bid. No bleeding Above score calculated as 2 points each if present [Age > 75, or Stroke/TIA/TE] 3) Bradycardia - resolved 4) OSA -- Appears quite severe. Followed by Dr. Halford [name]. Emphasized need for complaince 5) HTN  --Blood pressure well controlled. Continue current regimen.   Bensimhon, Daniel,MD 12:31 PM

## 2017-04-11 NOTE — Patient Instructions (Signed)
INCREASE Lasix to 80mg  twice daily for 2 days. **If weight doesn't decrease please call our office Wednesday**  INCREASE Potassium to 31meq twice daily for 2 days.  Follow up with Dr.Bensimhon in 3 weeks

## 2017-04-12 ENCOUNTER — Encounter: Payer: Self-pay | Admitting: Family Medicine

## 2017-04-13 ENCOUNTER — Ambulatory Visit: Payer: Medicare Other | Admitting: Family Medicine

## 2017-04-13 ENCOUNTER — Telehealth (HOSPITAL_COMMUNITY): Payer: Self-pay | Admitting: *Deleted

## 2017-04-13 NOTE — Telephone Encounter (Signed)
Patient called and left VM on triage line reporting that since she increased her lasix for 2 days she has had a 9lb weight loss and swelling in ankles/feet has completely resolved.  Patient stated she is feeling much better.  She wanted to confirm that she needs to resume her previous dose of lasix 80 mg Once Daily and potassium 1 tablet daily alternating with 2 tablets every other day.    I called patient back to confirm she is correct in resuming those previous doses.  I left her a VM stating if she had any other questions to call me back.

## 2017-04-18 ENCOUNTER — Encounter: Payer: Self-pay | Admitting: Physical Therapy

## 2017-04-18 ENCOUNTER — Ambulatory Visit: Payer: Medicare Other | Admitting: Physical Therapy

## 2017-04-18 DIAGNOSIS — R262 Difficulty in walking, not elsewhere classified: Secondary | ICD-10-CM | POA: Diagnosis not present

## 2017-04-18 DIAGNOSIS — R293 Abnormal posture: Secondary | ICD-10-CM | POA: Diagnosis not present

## 2017-04-18 DIAGNOSIS — M5442 Lumbago with sciatica, left side: Secondary | ICD-10-CM

## 2017-04-18 DIAGNOSIS — M6281 Muscle weakness (generalized): Secondary | ICD-10-CM | POA: Diagnosis not present

## 2017-04-18 NOTE — Therapy (Signed)
[facility] [address] [place], [place], [address] Phone: [phone]   Fax:  [phone]  Physical Therapy Treatment  Patient Details  Name: [name] MRN: [id number] Date of Birth: 1931-05-26 Referring Provider: Hulan Saas, MD  Encounter Date: 04/18/2017      PT End of Session - 04/18/17 1205    Visit Number 5   Number of Visits 10   Date for PT Re-Evaluation 05/18/17   Authorization Type gcodes 10th visit; KX at 28   PT Start Time 1200  15 min late   PT Stop Time 1245   PT Time Calculation (min) 45 min   Activity Tolerance Patient tolerated treatment well   Behavior During Therapy [facility] for tasks assessed/performed      Past Medical History:  Diagnosis Date  . Anemia    iron defficiency  . Anginal pain ([place])   . Arthritis    "knees, feet, hands; joints" (05/27/2015)  . Asthma   . Atrial fibrillation or flutter    s/p RFCA 7/08;   s/p DCCV in past;   previously on amiodarone;  amio stopped due to lung toxicity  . CAD (coronary artery disease)    s/p NSTEMI tx with BMS to OM1 3/08;  cath 3/08: pOM 99% tx with PCI, pLAD 20%, ? mod stenosis at the AM  . CAP (community acquired pneumonia) 05/24/2015  . Chronic diastolic heart failure (HCC)    echo 11/11:  EF 55-60%, severe LVH, mod LAE, mild MR, mildly increased PASP  . COPD (chronic obstructive pulmonary disease) ([place])   . Degenerative joint disease   . Dystrophy, corneal stromal   . Heart murmur   . HLD (hyperlipidemia)   . HTN (hypertension)    essential nos  . Hypopotassemia    PMH of  . Muscle pain   . Myocardial infarction ([place]) 2008  . OSA on CPAP   . Osteoporosis   . Pneumonia 05/27/2015  . Protein calorie malnutrition ([place])   . Rash and nonspecific skin eruption    both arms,awaiting bio   dr Delman Cheadle  . Seasonal allergies   . Shortness of breath dyspnea   . Type II diabetes mellitus ([place])    Dr Loanne Drilling    Past Surgical  History:  Procedure Laterality Date  . A FLUTTER ABLATION     Dr Lovena Le  . CATARACT EXTRACTION W/ INTRAOCULAR LENS  IMPLANT, BILATERAL Bilateral   . CHOLECYSTECTOMY    . COLONOSCOPY  6/07   2 polyps Dr. Kinnie Feil in [place]  . colonoscopy with polypectomy      X 2; [facility]  . CORNEAL TRANSPLANT Bilateral   . CORONARY ANGIOPLASTY WITH STENT PLACEMENT  02/2007   [facility] w/Dr Lovena Le  . EYE SURGERY    . gravid 2 para 2    . KNEE CARTILAGE SURGERY Right   . LEFT AND RIGHT HEART CATHETERIZATION WITH CORONARY ANGIOGRAM N/A 05/07/2014   Procedure: LEFT AND RIGHT HEART CATHETERIZATION WITH CORONARY ANGIOGRAM;  Surgeon: Peter M Martinique, MD;  Location: [facility] CATH LAB;  Service: Cardiovascular;  Laterality: N/A;  . TOTAL KNEE ARTHROPLASTY Right 06/28/2016   Procedure: TOTAL KNEE ARTHROPLASTY;  Surgeon: Frederik Pear, MD;  Location: [place];  Service: Orthopedics;  Laterality: Right;    There were no vitals filed for this visit.      Subjective Assessment - 04/18/17 1203    Subjective 15 min late: My hip is hurting less in weight bearing.  Currently in Pain? --  Pain walking into clinic 5/10 today.    Pain Score 5    Pain Location Hip   Pain Orientation Left   Aggravating Factors  weightbearing   Pain Relieving Factors Non-weightbearing   Multiple Pain Sites No                         [facility] Adult PT Treatment/Exercise - 04/18/17 0001      Knee/Hip Exercises: Stretches   Piriformis Stretch Left;3 reps;10 seconds   Other Knee/Hip Stretches Single knee to chest 3x 20 se  LT only     Knee/Hip Exercises: Aerobic   Nustep L2 x 8 min  Pt monitored throughout for tolerance     Knee/Hip Exercises: Supine   Quad Sets --  add/ TA/ glutes cocontraction 10x   Bridges Strengthening;Both;2 sets;5 reps     Knee/Hip Exercises: Sidelying   Hip ABduction --  Leg in L position, knee slightly bent. 2x 10   Clams LT 10x2 no resistance  VC/TC on technique,      Moist Heat Therapy   Number  Minutes Moist Heat 15 Minutes   Moist Heat Location Hip     Electrical Stimulation   Electrical Stimulation Location LT lateral gluteals   Electrical Stimulation Action IFC   Electrical Stimulation Parameters 80-150HZ    Electrical Stimulation Goals Pain                  PT Short Term Goals - 04/18/17 1206      PT SHORT TERM GOAL #1   Title Pt will be independent with initial HEP   Time 4   Period Weeks   Status Achieved     PT SHORT TERM GOAL #2   Title Pt will be able to walk with upright posture with LRAD   Time 4   Period Weeks   Status On-going  Pt appeared to walk more erect more often during her session today.      PT SHORT TERM GOAL #3   Title Pt will report 25% less pain when standing and walking   Time 4   Period Weeks   Status On-going  15%           PT Long Term Goals - 03/30/17 1332      PT LONG TERM GOAL #1   Title independent with advanced HEP   Time 8   Period Weeks   Status On-going     PT LONG TERM GOAL #2   Title 50% less pain during ambulation so she can walk around the grocery store   Time 8   Period Weeks   Status On-going     PT LONG TERM GOAL #3   Title FOTO < or = 36%   Time 8   Period Weeks   Status On-going     PT LONG TERM GOAL #4   Title LE MMT improved to 4/5 in all directions for improved gait and functional activities   Time 8   Period Weeks   Status On-going               Plan - 04/18/17 1205    Clinical Impression Statement Pt is really improving in her ability to facilitate her gluteals on the left better, therefore mre effectively strengthening throughout the group. She does need verbal reminding and at times tactile cues to not shorten at the waist and use her back more than her hip.  Pt reports at this time her Lt hip feels 60%-70% better/less painful.     Rehab Potential Good   Clinical Impairments Affecting Rehab Potential history of right TKA and abdominal surgery - cholecystectomy   PT  Frequency 2x / week   PT Duration 8 weeks   PT Treatment/Interventions ADLs/Self Care Home Management;Biofeedback;Cryotherapy;Electrical Stimulation;Iontophoresis 4mg /ml Dexamethasone;Moist Heat;Ultrasound;Gait training;Stair training;Therapeutic activities;Therapeutic exercise;Balance training;Neuromuscular re-education;Patient/family education;Manual techniques;Passive range of motion;Dry needling;Taping   PT Next Visit Plan  Hip strength, flexibility, Nustep, modalities. quad strength   Consulted and Agree with Plan of Care Patient      Patient will benefit from skilled therapeutic intervention in order to improve the following deficits and impairments:  Abnormal gait, Decreased activity tolerance, Decreased coordination, Decreased endurance, Decreased mobility, Decreased range of motion, Decreased strength, Difficulty walking, Increased muscle spasms, Pain, Postural dysfunction, Improper body mechanics  Visit Diagnosis: Acute left-sided low back pain with left-sided sciatica  Muscle weakness (generalized)  Difficulty in walking, not elsewhere classified  Abnormal posture     Problem List Patient Active Problem List   Diagnosis Date Noted  . Osteopenia 02/17/2017  . Piriformis syndrome of left side 02/16/2017  . Acquired leg length discrepancy 02/16/2017  . COPD (chronic obstructive pulmonary disease) ([place]) 01/27/2017  . Lumbar radiculopathy, acute 01/27/2017  . Rash 01/27/2017  . Granuloma annulare 07/27/2016  . Numbness of fingers 07/27/2016  . Primary osteoarthritis of right knee 06/27/2016  . Diabetes ([place]) 03/07/2016  . Acute renal failure superimposed on stage 3 chronic kidney disease ([place])   . Sepsis ([place]) 05/24/2015  . OSA on CPAP 08/28/2014  . Chronic diastolic heart failure ([place]) 07/30/2014  . Nocturnal hypoxemia 07/09/2014  . Dyspnea 05/15/2014  . Pulmonary HTN ([place]) 05/15/2014  . Crescendo angina ([place]) 05/02/2014  . Band keratopathy 01/16/2014  . Cornea  replaced by transplant 01/16/2014  . Epiphora due to insufficient drainage 06/13/2013  . Hyperthyroidism 11/04/2011  . Multinodular goiter 10/14/2011  . Atrophy, Fuchs' 09/29/2011  . ATRIAL FIBRILLATION 06/03/2010  . CHF 02/17/2010  . VITILIGO 11/07/2009  . SINOATRIAL NODE DYSFUNCTION 06/16/2009  . Iron deficiency anemia 01/02/2009  . ANEMIA, PERNICIOUS 11/20/2008  . Atrial flutter ([place]) 06/25/2008  . HYPERLIPIDEMIA 01/29/2008  . Coronary atherosclerosis 01/29/2008  . HTN (hypertension) 10/30/2007    Azriel Dancy, PTA 04/18/2017, 12:27 PM  [facility] [address], [place], [place], [address] Phone: [phone]   Fax:  [phone]  Name: KATHRYNE RAMELLA MRN: [id number] Date of Birth: [birth year]

## 2017-04-20 ENCOUNTER — Ambulatory Visit: Payer: Medicare Other | Admitting: Physical Therapy

## 2017-04-20 ENCOUNTER — Encounter: Payer: Self-pay | Admitting: Physical Therapy

## 2017-04-20 DIAGNOSIS — M5442 Lumbago with sciatica, left side: Secondary | ICD-10-CM | POA: Diagnosis not present

## 2017-04-20 DIAGNOSIS — R262 Difficulty in walking, not elsewhere classified: Secondary | ICD-10-CM

## 2017-04-20 DIAGNOSIS — R293 Abnormal posture: Secondary | ICD-10-CM

## 2017-04-20 DIAGNOSIS — M6281 Muscle weakness (generalized): Secondary | ICD-10-CM

## 2017-04-20 NOTE — Therapy (Signed)
[facility] [address], [place], [place], [address] Phone: [phone]   Fax:  [phone]  Physical Therapy Treatment  Patient Details  Name: [name] MRN: [id number] Date of Birth: December 09, 1931 Referring Provider: Hulan Saas, MD  Encounter Date: 04/20/2017      PT End of Session - 04/20/17 1147    Visit Number 6   Number of Visits 10   Date for PT Re-Evaluation 05/18/17   Authorization Type gcodes 10th visit; KX at 60   PT Start Time 1147   PT Stop Time 1238   PT Time Calculation (min) 51 min   Activity Tolerance Patient tolerated treatment well   Behavior During Therapy [facility] for tasks assessed/performed      Past Medical History:  Diagnosis Date  . Anemia    iron defficiency  . Anginal pain ([place])   . Arthritis    "knees, feet, hands; joints" (05/27/2015)  . Asthma   . Atrial fibrillation or flutter    s/p RFCA 7/08;   s/p DCCV in past;   previously on amiodarone;  amio stopped due to lung toxicity  . CAD (coronary artery disease)    s/p NSTEMI tx with BMS to OM1 3/08;  cath 3/08: pOM 99% tx with PCI, pLAD 20%, ? mod stenosis at the AM  . CAP (community acquired pneumonia) 05/24/2015  . Chronic diastolic heart failure (HCC)    echo 11/11:  EF 55-60%, severe LVH, mod LAE, mild MR, mildly increased PASP  . COPD (chronic obstructive pulmonary disease) ([place])   . Degenerative joint disease   . Dystrophy, corneal stromal   . Heart murmur   . HLD (hyperlipidemia)   . HTN (hypertension)    essential nos  . Hypopotassemia    PMH of  . Muscle pain   . Myocardial infarction ([place]) 2008  . OSA on CPAP   . Osteoporosis   . Pneumonia 05/27/2015  . Protein calorie malnutrition ([place])   . Rash and nonspecific skin eruption    both arms,awaiting bio   dr Delman Cheadle  . Seasonal allergies   . Shortness of breath dyspnea   . Type II diabetes mellitus ([place])    Dr Loanne Drilling    Past Surgical History:   Procedure Laterality Date  . A FLUTTER ABLATION     Dr Lovena Le  . CATARACT EXTRACTION W/ INTRAOCULAR LENS  IMPLANT, BILATERAL Bilateral   . CHOLECYSTECTOMY    . COLONOSCOPY  6/07   2 polyps Dr. Kinnie Feil in [place]  . colonoscopy with polypectomy      X 2; [facility]  . CORNEAL TRANSPLANT Bilateral   . CORONARY ANGIOPLASTY WITH STENT PLACEMENT  02/2007   BMS w/Dr Lovena Le  . EYE SURGERY    . gravid 2 para 2    . KNEE CARTILAGE SURGERY Right   . LEFT AND RIGHT HEART CATHETERIZATION WITH CORONARY ANGIOGRAM N/A 05/07/2014   Procedure: LEFT AND RIGHT HEART CATHETERIZATION WITH CORONARY ANGIOGRAM;  Surgeon: Peter M Martinique, MD;  Location: [facility] CATH LAB;  Service: Cardiovascular;  Laterality: N/A;  . TOTAL KNEE ARTHROPLASTY Right 06/28/2016   Procedure: TOTAL KNEE ARTHROPLASTY;  Surgeon: Frederik Pear, MD;  Location: [place];  Service: Orthopedics;  Laterality: Right;    There were no vitals filed for this visit.      Subjective Assessment - 04/20/17 1150    Subjective Had some pain yesterday in my RT hip. I currently have no pain.    Currently in  Pain? No/denies   Multiple Pain Sites No                         [facility] Adult PT Treatment/Exercise - 04/20/17 0001      Knee/Hip Exercises: Aerobic   Nustep L2 x 8 min  Pt monitored throughout for tolerance     Knee/Hip Exercises: Seated   Long Arc Quad Strengthening;Both;2 sets;5 reps  Ball squeeze with postural focus   [employer] With posture emphasis and concurrnet TA contraction 2 x10    Clamshell with TheraBand Red  Concurrent TA/posture focus 2x 15   Knee/Hip Flexion 10 x alternating with TA contraction   2# added today     Knee/Hip Exercises: Supine   Bridges with [employer] Strengthening;2 sets;5 reps     Knee/Hip Exercises: Sidelying   Hip ABduction Both;2 sets;10 reps  Leg in L position, knee slightly bent. 2x 10   Clams LT 10x2 no resistance  VC/TC on technique,                   PT Short Term Goals -  04/18/17 1206      PT SHORT TERM GOAL #1   Title Pt will be independent with initial HEP   Time 4   Period Weeks   Status Achieved     PT SHORT TERM GOAL #2   Title Pt will be able to walk with upright posture with LRAD   Time 4   Period Weeks   Status On-going  Pt appeared to walk more erect more often during her session today.      PT SHORT TERM GOAL #3   Title Pt will report 25% less pain when standing and walking   Time 4   Period Weeks   Status On-going  15%           PT Long Term Goals - 03/30/17 1332      PT LONG TERM GOAL #1   Title independent with advanced HEP   Time 8   Period Weeks   Status On-going     PT LONG TERM GOAL #2   Title 50% less pain during ambulation so she can walk around the grocery store   Time 8   Period Weeks   Status On-going     PT LONG TERM GOAL #3   Title FOTO < or = 36%   Time 8   Period Weeks   Status On-going     PT LONG TERM GOAL #4   Title LE MMT improved to 4/5 in all directions for improved gait and functional activities   Time 8   Period Weeks   Status On-going               Plan - 04/20/17 1151    Clinical Impression Statement Pt ambulating with improved posture and less of a lean to the right. Pain is consistently satying low or absent all together. Added weights to her LE strengthening which she tolerated well.     Rehab Potential Good   Clinical Impairments Affecting Rehab Potential history of right TKA and abdominal surgery - cholecystectomy   PT Frequency 2x / week   PT Duration 8 weeks   PT Treatment/Interventions ADLs/Self Care Home Management;Biofeedback;Cryotherapy;Electrical Stimulation;Iontophoresis 4mg /ml Dexamethasone;Moist Heat;Ultrasound;Gait training;Stair training;Therapeutic activities;Therapeutic exercise;Balance training;Neuromuscular re-education;Patient/family education;Manual techniques;Passive range of motion;Dry needling;Taping   PT Next Visit Plan MMT bil hips next visit, continue  with strength of hips and  knees   Consulted and Agree with Plan of Care Patient      Patient will benefit from skilled therapeutic intervention in order to improve the following deficits and impairments:  Abnormal gait, Decreased activity tolerance, Decreased coordination, Decreased endurance, Decreased mobility, Decreased range of motion, Decreased strength, Difficulty walking, Increased muscle spasms, Pain, Postural dysfunction, Improper body mechanics  Visit Diagnosis: Acute left-sided low back pain with left-sided sciatica  Muscle weakness (generalized)  Difficulty in walking, not elsewhere classified  Abnormal posture     Problem List Patient Active Problem List   Diagnosis Date Noted  . Osteopenia 02/17/2017  . Piriformis syndrome of left side 02/16/2017  . Acquired leg length discrepancy 02/16/2017  . COPD (chronic obstructive pulmonary disease) ([place]) 01/27/2017  . Lumbar radiculopathy, acute 01/27/2017  . Rash 01/27/2017  . Granuloma annulare 07/27/2016  . Numbness of fingers 07/27/2016  . Primary osteoarthritis of right knee 06/27/2016  . Diabetes ([place]) 03/07/2016  . Acute renal failure superimposed on stage 3 chronic kidney disease ([place])   . Sepsis ([place]) 05/24/2015  . OSA on CPAP 08/28/2014  . Chronic diastolic heart failure ([place]) 07/30/2014  . Nocturnal hypoxemia 07/09/2014  . Dyspnea 05/15/2014  . Pulmonary HTN ([place]) 05/15/2014  . Crescendo angina ([place]) 05/02/2014  . Band keratopathy 01/16/2014  . 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 Epiphora due to insufficient drainage 06/13/2013  . Hyperthyroidism 11/04/2011  . Multinodular goiter 10/14/2011  . Atrophy, Fuchs' 09/29/2011  . ATRIAL FIBRILLATION 06/03/2010  . CHF 02/17/2010  . VITILIGO 11/07/2009  . SINOATRIAL NODE DYSFUNCTION 06/16/2009  . Iron deficiency anemia 01/02/2009  . ANEMIA, PERNICIOUS 11/20/2008  . Atrial flutter ([place]) 06/25/2008  . HYPERLIPIDEMIA 01/29/2008  . Coronary  atherosclerosis 01/29/2008  . HTN (hypertension) 10/30/2007    [name], PTA 04/20/2017, 12:41 PM  [facility] [address], [place], [place], [address] Phone: [phone]   Fax:  [phone]  Name: [name] MRN: [id number] Date of Birth: 06/28/1931

## 2017-04-25 ENCOUNTER — Ambulatory Visit: Payer: Medicare Other | Admitting: Physical Therapy

## 2017-04-25 DIAGNOSIS — M5442 Lumbago with sciatica, left side: Secondary | ICD-10-CM | POA: Diagnosis not present

## 2017-04-25 DIAGNOSIS — R262 Difficulty in walking, not elsewhere classified: Secondary | ICD-10-CM

## 2017-04-25 DIAGNOSIS — M6281 Muscle weakness (generalized): Secondary | ICD-10-CM | POA: Diagnosis not present

## 2017-04-25 DIAGNOSIS — R293 Abnormal posture: Secondary | ICD-10-CM | POA: Diagnosis not present

## 2017-04-25 NOTE — Therapy (Signed)
[facility] [address], [place], [place], [address] Phone: [phone]   Fax:  [phone]  Physical Therapy Treatment  Patient Details  Name: [name] MRN: [id number] Date of Birth: Dec 09, 1931 Referring Provider: Hulan Saas, MD  Encounter Date: 04/25/2017      PT End of Session - 04/25/17 1231    Visit Number 7   Number of Visits 10   Date for PT Re-Evaluation 05/18/17   Authorization Type gcodes 10th visit; KX at 42   PT Start Time 1150   PT Stop Time 1230   PT Time Calculation (min) 40 min   Activity Tolerance Patient tolerated treatment well   Behavior During Therapy [facility] for tasks assessed/performed      Past Medical History:  Diagnosis Date  . Anemia    iron defficiency  . Anginal pain ([place])   . Arthritis    "knees, feet, hands; joints" (05/27/2015)  . Asthma   . Atrial fibrillation or flutter    s/p RFCA 7/08;   s/p DCCV in past;   previously on amiodarone;  amio stopped due to lung toxicity  . CAD (coronary artery disease)    s/p NSTEMI tx with BMS to OM1 3/08;  cath 3/08: pOM 99% tx with PCI, pLAD 20%, ? mod stenosis at the AM  . CAP (community acquired pneumonia) 05/24/2015  . Chronic diastolic heart failure (HCC)    echo 11/11:  EF 55-60%, severe LVH, mod LAE, mild MR, mildly increased PASP  . COPD (chronic obstructive pulmonary disease) ([place])   . Degenerative joint disease   . Dystrophy, corneal stromal   . Heart murmur   . HLD (hyperlipidemia)   . HTN (hypertension)    essential nos  . Hypopotassemia    PMH of  . Muscle pain   . Myocardial infarction ([place]) 2008  . OSA on CPAP   . Osteoporosis   . Pneumonia 05/27/2015  . Protein calorie malnutrition ([place])   . Rash and nonspecific skin eruption    both arms,awaiting bio   dr Delman Cheadle  . Seasonal allergies   . Shortness of breath dyspnea   . Type II diabetes mellitus ([place])    Dr Loanne Drilling    Past Surgical History:   Procedure Laterality Date  . A FLUTTER ABLATION     Dr Lovena Le  . CATARACT EXTRACTION W/ INTRAOCULAR LENS  IMPLANT, BILATERAL Bilateral   . CHOLECYSTECTOMY    . COLONOSCOPY  6/07   2 polyps Dr. Kinnie Feil in [place]  . colonoscopy with polypectomy      X 2; [facility]  . CORNEAL TRANSPLANT Bilateral   . CORONARY ANGIOPLASTY WITH STENT PLACEMENT  02/2007   BMS w/Dr Lovena Le  . EYE SURGERY    . gravid 2 para 2    . KNEE CARTILAGE SURGERY Right   . LEFT AND RIGHT HEART CATHETERIZATION WITH CORONARY ANGIOGRAM N/A 05/07/2014   Procedure: LEFT AND RIGHT HEART CATHETERIZATION WITH CORONARY ANGIOGRAM;  Surgeon: Peter M Martinique, MD;  Location: [facility] CATH LAB;  Service: Cardiovascular;  Laterality: N/A;  . TOTAL KNEE ARTHROPLASTY Right 06/28/2016   Procedure: TOTAL KNEE ARTHROPLASTY;  Surgeon: Frederik Pear, MD;  Location: [place];  Service: Orthopedics;  Laterality: Right;    There were no vitals filed for this visit.      Subjective Assessment - 04/25/17 1152    Subjective My pain started up again yesterday and has been acting up since. Pt back to walking stooped forward  and to the LT   Currently in Pain? Yes   Pain Score 5    Pain Location Hip   Pain Orientation Left   Pain Descriptors / Indicators Sore;Sharp;Radiating  Radiating to the lateral calf   Aggravating Factors  weighbearing   Pain Relieving Factors NWB   Multiple Pain Sites No                         [facility] Adult PT Treatment/Exercise - 04/25/17 0001      Knee/Hip Exercises: Aerobic   Nustep L2 x 10 min  [name] present     Knee/Hip Exercises: Seated   Long Arc Quad Strengthening;Both;2 sets;5 reps  Ball squeeze with postural focus   [employer] With posture emphasis and concurrnet TA contraction 2 x10    Clamshell with TheraBand Red  Concurrent TA/posture focus 2x 15   Knee/Hip Flexion 10 x alternating with TA contraction   2# added today     Knee/Hip Exercises: Sidelying   Hip ABduction Both;2 sets;10 reps  Leg in  L position, knee slightly bent. 2x 10   Clams Bil 2# added to distal quad: 2x10                  PT Short Term Goals - 04/25/17 1159      PT SHORT TERM GOAL #2   Title Pt will be able to walk with upright posture with LRAD   Time 4   Period Weeks   Status Achieved           PT Long Term Goals - 03/30/17 1332      PT LONG TERM GOAL #1   Title independent with advanced HEP   Time 8   Period Weeks   Status On-going     PT LONG TERM GOAL #2   Title 50% less pain during ambulation so she can walk around the grocery store   Time 8   Period Weeks   Status On-going     PT LONG TERM GOAL #3   Title FOTO < or = 36%   Time 8   Period Weeks   Status On-going     PT LONG TERM GOAL #4   Title LE MMT improved to 4/5 in all directions for improved gait and functional activities   Time 8   Period Weeks   Status On-going               Plan - 04/25/17 1158    Clinical Impression Statement Pt reports this past weekend she did a lot of standing, wqalking and cooking. She thinks this might have exacerbated her LT LE symptoms. She is also considering returning to her knee MD as she feels her knee "is not right."  [name] encouraged pt to follow up with DR Tamala Julian to discuss the possibility of LTLE symptoms coming from her back? In addition, pt was encouraged to moderate her standing and walking as she has her granddaughters graduation this weekend.  Pain was less at the end of session.    Rehab Potential Good   Clinical Impairments Affecting Rehab Potential history of right TKA and abdominal surgery - cholecystectomy   PT Frequency 2x / week   PT Duration 8 weeks   PT Treatment/Interventions ADLs/Self Care Home Management;Biofeedback;Cryotherapy;Electrical Stimulation;Iontophoresis 4mg /ml Dexamethasone;Moist Heat;Ultrasound;Gait training;Stair training;Therapeutic activities;Therapeutic exercise;Balance training;Neuromuscular re-education;Patient/family education;Manual  techniques;Passive range of motion;Dry needling;Taping   PT Next Visit Plan MMT bil hips next visit,continue with core  strength , hip & knee strength. See if pt made appt with MD.   Consulted and Agree with Plan of Care --      Patient will benefit from skilled therapeutic intervention in order to improve the following deficits and impairments:  Abnormal gait, Decreased activity tolerance, Decreased coordination, Decreased endurance, Decreased mobility, Decreased range of motion, Decreased strength, Difficulty walking, Increased muscle spasms, Pain, Postural dysfunction, Improper body mechanics  Visit Diagnosis: Acute left-sided low back pain with left-sided sciatica  Muscle weakness (generalized)  Difficulty in walking, not elsewhere classified  Abnormal posture     Problem List Patient Active Problem List   Diagnosis Date Noted  . Osteopenia 02/17/2017  . Piriformis syndrome of left side 02/16/2017  . Acquired leg length discrepancy 02/16/2017  . COPD (chronic obstructive pulmonary disease) ([place]) 01/27/2017  . Lumbar radiculopathy, acute 01/27/2017  . Rash 01/27/2017  . Granuloma annulare 07/27/2016  . Numbness of fingers 07/27/2016  . Primary osteoarthritis of right knee 06/27/2016  . Diabetes ([place]) 03/07/2016  . Acute renal failure superimposed on stage 3 chronic kidney disease ([place])   . Sepsis ([place]) 05/24/2015  . OSA on CPAP 08/28/2014  . Chronic diastolic heart failure ([place]) 07/30/2014  . Nocturnal hypoxemia 07/09/2014  . Dyspnea 05/15/2014  . Pulmonary HTN ([place]) 05/15/2014  . Crescendo angina ([place]) 05/02/2014  . Band keratopathy 01/16/2014  . 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 Epiphora due to insufficient drainage 06/13/2013  . Hyperthyroidism 11/04/2011  . Multinodular goiter 10/14/2011  . Atrophy, Fuchs' 09/29/2011  . ATRIAL FIBRILLATION 06/03/2010  . CHF 02/17/2010  . VITILIGO 11/07/2009  . SINOATRIAL NODE DYSFUNCTION 06/16/2009  . Iron deficiency  anemia 01/02/2009  . ANEMIA, PERNICIOUS 11/20/2008  . Atrial flutter ([place]) 06/25/2008  . HYPERLIPIDEMIA 01/29/2008  . Coronary atherosclerosis 01/29/2008  . HTN (hypertension) 10/30/2007    [name], [name] 04/25/2017, 12:33 PM  [facility] [address] [place], [place], [address] Phone: [phone]   Fax:  [phone]  Name: [name] MRN: [id number] Date of Birth: 09/18/1931

## 2017-04-27 ENCOUNTER — Encounter: Payer: Self-pay | Admitting: Physical Therapy

## 2017-04-27 ENCOUNTER — Ambulatory Visit: Payer: Medicare Other | Attending: Family Medicine | Admitting: Physical Therapy

## 2017-04-27 DIAGNOSIS — M5442 Lumbago with sciatica, left side: Secondary | ICD-10-CM | POA: Insufficient documentation

## 2017-04-27 DIAGNOSIS — M6281 Muscle weakness (generalized): Secondary | ICD-10-CM | POA: Diagnosis not present

## 2017-04-27 DIAGNOSIS — R262 Difficulty in walking, not elsewhere classified: Secondary | ICD-10-CM | POA: Insufficient documentation

## 2017-04-27 DIAGNOSIS — R293 Abnormal posture: Secondary | ICD-10-CM | POA: Insufficient documentation

## 2017-04-27 NOTE — Therapy (Addendum)
[facility] [address] [place], [place], [address] Phone: [phone]   Fax:  [phone]  Physical Therapy Treatment  Patient Details  Name: [name] MRN: [id number] Date of Birth: 13-Dec-1931 Referring Provider: Hulan Saas, MD  Encounter Date: 04/27/2017      PT End of Session - 04/27/17 1153    Visit Number 8   Number of Visits 10   Date for PT Re-Evaluation 05/18/17   Authorization Type gcodes 10th visit; KX at 50   PT Start Time 1153   PT Stop Time 1244   PT Time Calculation (min) 51 min   Activity Tolerance Patient tolerated treatment well   Behavior During Therapy [facility] for tasks assessed/performed      Past Medical History:  Diagnosis Date  . Anemia    iron defficiency  . Anginal pain ([place])   . Arthritis    "knees, feet, hands; joints" (05/27/2015)  . Asthma   . Atrial fibrillation or flutter    s/p RFCA 7/08;   s/p DCCV in past;   previously on amiodarone;  amio stopped due to lung toxicity  . CAD (coronary artery disease)    s/p NSTEMI tx with BMS to OM1 3/08;  cath 3/08: pOM 99% tx with PCI, pLAD 20%, ? mod stenosis at the AM  . CAP (community acquired pneumonia) 05/24/2015  . Chronic diastolic heart failure (HCC)    echo 11/11:  EF 55-60%, severe LVH, mod LAE, mild MR, mildly increased PASP  . COPD (chronic obstructive pulmonary disease) ([place])   . Degenerative joint disease   . Dystrophy, corneal stromal   . Heart murmur   . HLD (hyperlipidemia)   . HTN (hypertension)    essential nos  . Hypopotassemia    PMH of  . Muscle pain   . Myocardial infarction ([place]) 2008  . OSA on CPAP   . Osteoporosis   . Pneumonia 05/27/2015  . Protein calorie malnutrition ([place])   . Rash and nonspecific skin eruption    both arms,awaiting bio   dr Delman Cheadle  . Seasonal allergies   . Shortness of breath dyspnea   . Type II diabetes mellitus ([place])    Dr Loanne Drilling    Past Surgical History:  Procedure  Laterality Date  . A FLUTTER ABLATION     Dr Lovena Le  . CATARACT EXTRACTION W/ INTRAOCULAR LENS  IMPLANT, BILATERAL Bilateral   . CHOLECYSTECTOMY    . COLONOSCOPY  6/07   2 polyps Dr. Kinnie Feil in [place]  . colonoscopy with polypectomy      X 2; [facility]  . CORNEAL TRANSPLANT Bilateral   . CORONARY ANGIOPLASTY WITH STENT PLACEMENT  02/2007   BMS w/Dr Lovena Le  . EYE SURGERY    . gravid 2 para 2    . KNEE CARTILAGE SURGERY Right   . LEFT AND RIGHT HEART CATHETERIZATION WITH CORONARY ANGIOGRAM N/A 05/07/2014   Procedure: LEFT AND RIGHT HEART CATHETERIZATION WITH CORONARY ANGIOGRAM;  Surgeon: Peter M Martinique, MD;  Location: [facility] CATH LAB;  Service: Cardiovascular;  Laterality: N/A;  . TOTAL KNEE ARTHROPLASTY Right 06/28/2016   Procedure: TOTAL KNEE ARTHROPLASTY;  Surgeon: Frederik Pear, MD;  Location: [facility];  Service: Orthopedics;  Laterality: Right;    There were no vitals filed for this visit.      Subjective Assessment - 04/27/17 1156    Subjective States the right knee is not the problem, but left back, hip, and down the leg is hurting  and burning   Pertinent History history of right TKA and abdominal surgery - cholecystectomy   How long can you stand comfortably? minimal   How long can you walk comfortably? minimal   Patient Stated Goals walking more and doing more around the house, walk around the grocery store   Currently in Pain? Yes   Pain Score 6    Pain Location Hip   Pain Orientation Left   Pain Descriptors / Indicators Burning;Radiating   Pain Radiating Towards down thigh, lower leg, foot   Pain Onset More than a month ago   Pain Frequency Intermittent   Aggravating Factors  weight bearing   Pain Relieving Factors NWB   Effect of Pain on Daily Activities walking and standing   Multiple Pain Sites No            [facility] PT Assessment - 04/27/17 0001      Strength   Right Hip ABduction 4-/5   Right Hip ADduction 4-/5   Left Hip Internal Rotation 4/5   Left Hip ABduction 4/5    Left Hip ADduction 4/5   Left Knee Flexion 4/5                     [facility] Adult PT Treatment/Exercise - 04/27/17 0001      Moist Heat Therapy   Number Minutes Moist Heat 15 Minutes   Moist Heat Location Hip     Electrical Stimulation   Electrical Stimulation Location LT lateral gluteals   Electrical Stimulation Action IFC   Electrical Stimulation Parameters 80-150 Hz   Electrical Stimulation Goals Pain     Manual Therapy   Manual Therapy Soft tissue mobilization   Soft tissue mobilization left glutes, TFL, piriformis                  PT Short Term Goals - 04/25/17 1159      PT SHORT TERM GOAL #2   Title Pt will be able to walk with upright posture with LRAD   Time 4   Period Weeks   Status Achieved           PT Long Term Goals - 03/30/17 1332      PT LONG TERM GOAL #1   Title independent with advanced HEP   Time 8   Period Weeks   Status On-going     PT LONG TERM GOAL #2   Title 50% less pain during ambulation so she can walk around the grocery store   Time 8   Period Weeks   Status On-going     PT LONG TERM GOAL #3   Title FOTO < or = 36%   Time 8   Period Weeks   Status On-going     PT LONG TERM GOAL #4   Title LE MMT improved to 4/5 in all directions for improved gait and functional activities   Time 8   Period Weeks   Status On-going               Plan - 04/27/17 1153    Clinical Impression Statement Patient had muscle spasms of left gluteal, piriformis and TFL muscles.  Pt reports feeling improvement after manual treatment. She has some increased pain down leg in prone with extensions and increased burning with left ext muscle test.  Pt was educated on stretches.  She also continues to have weakness in bilateral LE.  Not clear as to weather pt's issues stem from low back or  increased spasms in left hip causing nerve irritation.  Pt will see the doctor next week.  Skilled PT needed to continue LE and core  strengthening for improved funtional activiites.   Clinical Impairments Affecting Rehab Potential history of right TKA and abdominal surgery - cholecystectomy   PT Treatment/Interventions ADLs/Self Care Home Management;Biofeedback;Cryotherapy;Electrical Stimulation;Iontophoresis 68m/ml Dexamethasone;Moist Heat;Ultrasound;Gait training;Stair training;Therapeutic activities;Therapeutic exercise;Balance training;Neuromuscular re-education;Patient/family education;Manual techniques;Passive range of motion;Dry needling;Taping   PT Next Visit Plan hip and core strength, stim, manual to left glutes   Consulted and Agree with Plan of Care Patient      Patient will benefit from skilled therapeutic intervention in order to improve the following deficits and impairments:  Abnormal gait, Decreased activity tolerance, Decreased coordination, Decreased endurance, Decreased mobility, Decreased range of motion, Decreased strength, Difficulty walking, Increased muscle spasms, Pain, Postural dysfunction, Improper body mechanics  Visit Diagnosis: Acute left-sided low back pain with left-sided sciatica  Muscle weakness (generalized)  Difficulty in walking, not elsewhere classified  Abnormal posture     Problem List Patient Active Problem List   Diagnosis Date Noted  . Osteopenia 02/17/2017  . Piriformis syndrome of left side 02/16/2017  . Acquired leg length discrepancy 02/16/2017  . COPD (chronic obstructive pulmonary disease) ([place] 01/27/2017  . Lumbar radiculopathy, acute 01/27/2017  . Rash 01/27/2017  . Granuloma annulare 07/27/2016  . Numbness of fingers 07/27/2016  . Primary osteoarthritis of right knee 06/27/2016  . Diabetes ([place] 03/07/2016  . Acute renal failure superimposed on stage 3 chronic kidney disease ([place]   . Sepsis ([place] 05/24/2015  . OSA on CPAP 08/28/2014  . Chronic diastolic heart failure ([place] 07/30/2014  . Nocturnal hypoxemia 07/09/2014  . Dyspnea 05/15/2014  . Pulmonary  HTN ([place] 05/15/2014  . Crescendo angina ([place] 05/02/2014  . Band keratopathy 01/16/2014  . 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 Epiphora due to insufficient drainage 06/13/2013  . Hyperthyroidism 11/04/2011  . Multinodular goiter 10/14/2011  . Atrophy, Fuchs' 09/29/2011  . ATRIAL FIBRILLATION 06/03/2010  . CHF 02/17/2010  . VITILIGO 11/07/2009  . SINOATRIAL NODE DYSFUNCTION 06/16/2009  . Iron deficiency anemia 01/02/2009  . ANEMIA, PERNICIOUS 11/20/2008  . Atrial flutter ([place] 06/25/2008  . HYPERLIPIDEMIA 01/29/2008  . Coronary atherosclerosis 01/29/2008  . HTN (hypertension) 10/30/2007    JZannie Cove PT 04/27/2017, 1:28 PM  [facility] [address] [place] [address]Phone: [phone]  Fax:  [phone] Name: SELYZABETH GOATLEYMRN: [id number] of Birth: [birth year] PHYSICAL THERAPY DISCHARGE SUMMARY  Visits from Start of Care: 8  Current functional level related to goals / functional outcomes: See above goals   Remaining deficits: See above    Education / Equipment: HEP  Plan: Patient agrees to discharge.  Patient goals were not met. Patient is being discharged due to the patient's request.  ?????         JZannie Cove PT 05/18/17 10:01 AM

## 2017-05-02 ENCOUNTER — Encounter (HOSPITAL_COMMUNITY): Payer: Medicare Other

## 2017-05-04 ENCOUNTER — Ambulatory Visit (INDEPENDENT_AMBULATORY_CARE_PROVIDER_SITE_OTHER): Payer: Medicare Other | Admitting: Family Medicine

## 2017-05-04 ENCOUNTER — Encounter: Payer: Self-pay | Admitting: Family Medicine

## 2017-05-04 ENCOUNTER — Ambulatory Visit (HOSPITAL_COMMUNITY)
Admission: RE | Admit: 2017-05-04 | Discharge: 2017-05-04 | Disposition: A | Discharge status: Home / Self Care | Payer: Medicare Other | Source: Ambulatory Visit | Attending: Cardiology | Admitting: Cardiology

## 2017-05-04 VITALS — BP 156/52 | HR 73 | Wt 181.2 lb

## 2017-05-04 DIAGNOSIS — I5043 Acute on chronic combined systolic (congestive) and diastolic (congestive) heart failure: Secondary | ICD-10-CM | POA: Diagnosis not present

## 2017-05-04 DIAGNOSIS — I272 Pulmonary hypertension, unspecified: Secondary | ICD-10-CM | POA: Diagnosis not present

## 2017-05-04 DIAGNOSIS — I5032 Chronic diastolic (congestive) heart failure: Secondary | ICD-10-CM

## 2017-05-04 DIAGNOSIS — I10 Essential (primary) hypertension: Secondary | ICD-10-CM

## 2017-05-04 DIAGNOSIS — G4733 Obstructive sleep apnea (adult) (pediatric): Secondary | ICD-10-CM | POA: Diagnosis not present

## 2017-05-04 DIAGNOSIS — M4698 Unspecified inflammatory spondylopathy, sacral and sacrococcygeal region: Secondary | ICD-10-CM | POA: Diagnosis not present

## 2017-05-04 DIAGNOSIS — Z91013 Allergy to seafood: Secondary | ICD-10-CM | POA: Diagnosis not present

## 2017-05-04 DIAGNOSIS — I482 Chronic atrial fibrillation, unspecified: Secondary | ICD-10-CM

## 2017-05-04 DIAGNOSIS — M47818 Spondylosis without myelopathy or radiculopathy, sacral and sacrococcygeal region: Secondary | ICD-10-CM

## 2017-05-04 DIAGNOSIS — Z79899 Other long term (current) drug therapy: Secondary | ICD-10-CM | POA: Insufficient documentation

## 2017-05-04 DIAGNOSIS — Z888 Allergy status to other drugs, medicaments and biological substances status: Secondary | ICD-10-CM | POA: Insufficient documentation

## 2017-05-04 DIAGNOSIS — I2510 Atherosclerotic heart disease of native coronary artery without angina pectoris: Secondary | ICD-10-CM | POA: Insufficient documentation

## 2017-05-04 DIAGNOSIS — Z880 Allergy status to penicillin: Secondary | ICD-10-CM | POA: Insufficient documentation

## 2017-05-04 DIAGNOSIS — I481 Persistent atrial fibrillation: Secondary | ICD-10-CM | POA: Insufficient documentation

## 2017-05-04 DIAGNOSIS — I4892 Unspecified atrial flutter: Secondary | ICD-10-CM | POA: Insufficient documentation

## 2017-05-04 DIAGNOSIS — I110 Hypertensive heart disease with heart failure: Secondary | ICD-10-CM | POA: Insufficient documentation

## 2017-05-04 NOTE — Assessment & Plan Note (Signed)
Injected today. Tolerated the procedure tall. Discussed the potential for el 3-4 weeks. Worsening symptoms we'll monitor for worsening blood sugars discussed HEP  If worsening symptoms will need to do more with hip Spent  25 minutes with patient face-to-face and had greater than 50% of counseling including as described above in assessment and plan.

## 2017-05-04 NOTE — Patient Instructions (Signed)
Will refer you to the [facility] Address: [address], [place], [place] [address] Phone: [phone]  No changes to medication at this time.  No lab work today.  Follow up 2 months.  Do the following things EVERYDAY: 1) Weigh yourself in the morning before breakfast. Write it down and keep it in a log. 2) Take your medicines as prescribed 3) Eat low salt foods-Limit salt (sodium) to 2000 mg per day.  4) Stay as active as you can everyday 5) Limit all fluids for the day to less than 2 liters

## 2017-05-04 NOTE — Patient Instructions (Addendum)
Good to see you  [name] is your friend.  Stay active though and have a great week end We injected your sacrum today and should help  Ice 20 minutes 2 times daily. Usually after activity and before bed. If not better in 3 weeks see me and we will need to consider looking at the back closer .

## 2017-05-04 NOTE — Progress Notes (Signed)
[facility] [address] [place], [place] [address] Phone: [phone] Subjective:    I'm seeing this patient by the request  of:  [name], MD   CC: Left hip pain f/u   [id number]  [name] is a 83 y.o. female coming in with complaint of left hip pain. Found to have SI joint pain  States worsening pain at this time.  Affecting all aspects of life Discussed that it is affecting the activities of daily living.  [place] and feels she will not be able to do it.     patient's previous imaging in June 2017 patient's hips were independently visualized by me. Hip show the patient does have moderate osteophytic changes as well as moderate pubic symphysitis.   Past Medical History:  Diagnosis Date  . Anemia    iron defficiency  . Anginal pain ([place])   . Arthritis    "knees, feet, hands; joints" (05/27/2015)  . Asthma   . Atrial fibrillation or flutter    s/p RFCA 7/08;   s/p DCCV in past;   previously on amiodarone;  amio stopped due to lung toxicity  . CAD (coronary artery disease)    s/p NSTEMI tx with BMS to OM1 3/08;  cath 3/08: pOM 99% tx with PCI, pLAD 20%, ? mod stenosis at the AM  . CAP (community acquired pneumonia) 05/24/2015  . Chronic diastolic heart failure (HCC)    echo 11/11:  EF 55-60%, severe LVH, mod LAE, mild MR, mildly increased PASP  . COPD (chronic obstructive pulmonary disease) ([place])   . Degenerative joint disease   . Dystrophy, corneal stromal   . Heart murmur   . HLD (hyperlipidemia)   . HTN (hypertension)    essential nos  . Hypopotassemia    PMH of  . Muscle pain   . Myocardial infarction ([place]) 2008  . OSA on CPAP   . Osteoporosis   . Pneumonia 05/27/2015  . Protein calorie malnutrition ([place])   . Rash and nonspecific skin eruption    both arms,awaiting bio   dr Delman Cheadle  . Seasonal allergies   . Shortness of breath dyspnea   . Type II diabetes mellitus ([place])    Dr  Loanne Drilling   Past Surgical History:  Procedure Laterality Date  . A FLUTTER ABLATION     Dr Lovena Le  . CATARACT EXTRACTION W/ INTRAOCULAR LENS  IMPLANT, BILATERAL Bilateral   . CHOLECYSTECTOMY    . COLONOSCOPY  6/07   2 polyps Dr. Kinnie Feil in [place]  . colonoscopy with polypectomy      X 2; [facility]  . CORNEAL TRANSPLANT Bilateral   . CORONARY ANGIOPLASTY WITH STENT PLACEMENT  02/2007   BMS w/Dr Lovena Le  . EYE SURGERY    . gravid 2 para 2    . KNEE CARTILAGE SURGERY Right   . LEFT AND RIGHT HEART CATHETERIZATION WITH CORONARY ANGIOGRAM N/A 05/07/2014   Procedure: LEFT AND RIGHT HEART CATHETERIZATION WITH CORONARY ANGIOGRAM;  Surgeon: Peter M Martinique, MD;  Location: [facility] CATH LAB;  Service: Cardiovascular;  Laterality: N/A;  . TOTAL KNEE ARTHROPLASTY Right 06/28/2016   Procedure: TOTAL KNEE ARTHROPLASTY;  Surgeon: Frederik Pear, MD;  Location: [facility];  Service: Orthopedics;  Laterality: Right;   Social History   Social History  . Marital status: Widowed    Spouse name: N/A  . Number of children: N/A  . Years of education: N/A  Occupational History  . Teacher    Social History Main Topics  . Smoking status: Former Smoker    Packs/day: 0.25    Years: 18.00    Types: Cigarettes    Quit date: 12/27/1968  . Smokeless tobacco: Never Used     Comment: smoked [place], up to 1 pp week  . Alcohol use Yes     Comment: occ  . Drug use: No  . Sexual activity: No   Other Topics Concern  . None   Social History Sports administrator. Widowed. Rarely drinks cafeine.    Allergies  Allergen Reactions  . Amlodipine Besylate Other (See Comments)    REACTION: tingling in lips & gum edema  . Levaquin [Levofloxacin] Shortness Of Breath and Swelling    angioedema  . Lobster [Shellfish Allergy] Other (See Comments)    angioedema  . Penicillins Rash  . Valsartan Other (See Comments)    REACTION: angioedema  . Codeine Other (See Comments)    Mental status changes  . Lipitor [Atorvastatin]      weakness  . Oxycodone   . Statins     Made her too weak  . Tramadol Hcl Nausea And Vomiting   Family History  Problem Relation Age of Onset  . Diabetes Mother   . Hypertension Mother   . Transient ischemic attack Mother   . Arthritis Mother   . Heart attack Father 49  . Arthritis Father   . Hypertension Father   . Breast cancer Maternal Aunt   . Arthritis Maternal Aunt     Past medical history, social, surgical and family history all reviewed in electronic medical record.  No pertanent information unless stated regarding to the chief complaint.   Review of Systems: No headache, visual changes, nausea, vomiting, diarrhea, constipation, dizziness, abdominal pain, skin rash, fevers, chills, night sweats, weight loss, swollen lymph nodes,chest pain, shortness of breath, mood changes.  Positive muscle aches and body aches  Objective  Blood pressure (!) 162/88, pulse 84, resp. rate 16, weight 180 lb (81.6 kg), SpO2 92 %.   Systems examined below as of 05/04/17 General: NAD A&O x3 mood, affect normal  HEENT: Pupils equal, extraocular movements intact no nystagmus Respiratory: not short of breath at rest or with speaking Cardiovascular: No lower extremity edema, non tender Skin: Warm dry intact with no signs of infection or rash on extremities or on axial skeleton. Abdomen: Soft nontender, no masses Neuro: Cranial nerves  intact, neurovascularly intact in all extremities with 2+ DTRs and 2+ pulses. Lymph: No lymphadenopathy appreciated today    Gait antalgic gait  MSK: Non tender with full range of motion and good stability and symmetric strength and tone of shoulders, elbows, wrist,  knee and ankles bilaterally.  Arthritic changes of multiple joints recent knee replacement wit h well-healed in full range of motion. Length discrepancy noted with right leg being quarter inch shorter. Hip: Left ROM IR: 15 Deg, ER: 45 Deg, Flexion: 120 Deg, Extension: 100 Deg, Abduction: 45 Deg,  Adduction: 25 Deg Strength Forward 5 strength but symmetric Pelvic alignment unremarkable to inspection and palpation. Standing hip rotation and gait without trendelenburg sign / unsteadiness. Greater trochanter without tenderness to palpation. Heart tenderness to palpation in the gluteal region.. Pain with Corky Sox More pain over the left SI joint then usual.    Procedure: Real-time Ultrasound Guided Injection of left sacroiliac joint Device: GE Logiq Q7 Ultrasound guided injection is preferred based studies that show increased duration, increased effect, greater  accuracy, decreased procedural pain, increased response rate, and decreased cost with ultrasound guided versus blind injection.  Verbal informed consent obtained.  Time-out conducted.  Noted no overlying erythema, induration, or other signs of local infection.  Skin prepped in a sterile fashion.  Local anesthesia: Topical Ethyl chloride.  With sterile technique and under real time ultrasound guidance:  With a 21-gauge 2 inch needle patient was injected with 2 mL of 0.5% Marcaine and 1 mL of Kenalog 40 mg/dL in the left sacroiliac joint Completed without difficulty  Pain immediately resolved suggesting accurate placement of the medication.  Advised to call if fevers/chills, erythema, induration, drainage, or persistent bleeding.  Images permanently stored and available for review in the ultrasound unit.  Impression: Technically successful ultrasound guided injection.     Impression and Recommendations:     This case required medical decision making of moderate complexity.      Note: This dictation was prepared with Dragon dictation along with smaller phrase technology. Any transcriptional errors that result from this process are unintentional.

## 2017-05-04 NOTE — Progress Notes (Signed)
Patient ID: [name], female   DOB: 05/24/1931, 83 y.o.   MRN: [id number]   ADVANCED HF CLINIC NOTE  Patient ID: [name], female   DOB: [birth year], 83 y.o.   MRN: [id number]  Referring Physician: Lovena Le Primary Care: Billey Gosling Primary Cardiologist:Taylor  HPI: [name] is a pleasant 83 yo woman with CAD, HTN, atrial fibrillation/A flutter and chronic prior systolic heart failure, (which was likely rate related) and diastolic heart failure. She was referred by Dr. Lovena Le for further evaluation of Pulmonary HTN.   Underwent 2D echo in 3/15 which showed EF 60-65% mild LVH. RV was normal. No significant valvular abnormalities.   Underwent R/L cath 05/07/14 Which showed stable CAD and significant PH  RA 6 RV 71/7 6 PA 63/17 (33)  PCWP 19 mm Hg  LV 174/16 mm Hg  AO 171/62 mean 103 mm Hg  PVR = 2.86 Oxygen saturations:  PA 69%  AO 99%  Cardiac Output/Index (Fick) 4.88/2.7    Left mainstem: Normal.  Left anterior descending (LAD): Moderate calcification proximally. Mild irregularities less than 10%. The first diagonal has 40-50% ostial disease.  Left circumflex (LCx): The LCx gives rise to a large OM1 then terminates in the AV groove. There is 20% disease in the proximal LCx. The first OM stent is patent with diffuse 20% disease.  Right coronary artery (RCA): The RCA arises anteriorly. There is an eccentric slit like stenosis in the proximal vessel to 60-70%. The mid vessel has segmental 70-80% disease.  Left ventriculography: Left ventricular systolic function is normal, LVEF is estimated at 55-65%, there is no significant mitral regurgitation   We saw her for an initial visit in May 2105 for pHTN and DOE. At that time she was quite dyspneic with activity. HR was in 40-50 range on carvedilol 50 bid and digoxin. We stopped her digoxin and cut carvedilol back to 25 bid. PFTs, VQ and CXR were ordered. I also asked her to decrease her H2O intake. We saw her back last  month and she was feeling much better. Given results of PFTs overnight oximeter was placed and referred to Dr. Gwenette Greet. She has had overnight oximetry which showed oxygen desaturation as low as 83%, and spent 97 minutes less than 88% during the night.   She returns today for HF follow up. Weight down 2 pounds since last visit. Weights at home are labile, but thinks that something is wrong with her scale - 179-185 pounds. Did feel SOB with walking into clinic, cannot walk throughout the grocery store without SOB. No SOB with ADL's. Does yoga 3 times a week at the [facility] without SOB. Eating low salt foods, drinking more than 2L a day. Taking all medications with compliance. Denies orthopnea and PND. Denies chest pain. Uses CPAP with compliance.    Results:  05/21/14: VQ normal 5/15: CXR: 3/16: Echo 3/16 EF 60-65% RV normal. Mild TR No RVSP measured   Emphysema. Otherwise normal.  5/15: PFTs FEV1  1.4 (95%) FVC    1.89 (110%) FEV/FVC 88% FEF 25-75%  66% DLCO 39%  BP rest: 138/58 BP peak: 182/66 Peak VO2: 8.9 (69.1% predicted peak VO2) VE/VCO2 slope: 34.6 OUES: Peak RER: 1.24 Ventilatory Threshold: 5.8 (50% predicted and 65% measured peak VO2) Peak RR 38 Peak Ventilation: 32.7 VE/MVV: 71%  PETCO2 at peak: 36 O2pulse: 8 (100% predicted O2pulse)     Labs  05/23/15  Creatinine 1.2 05/31/15    Creatinine 1.65 9/17  Creatinine 0.98 k 3.8  Current Outpatient Prescriptions  Medication Sig Dispense Refill  . apixaban (ELIQUIS) 5 MG TABS tablet Take 5 mg by mouth 2 (two) times daily.    . carvedilol (COREG) 12.5 MG tablet Take 1 tablet (12.5 mg total) by mouth 2 (two) times daily with a meal. 180 tablet 3  . clotrimazole (LOTRIMIN) 1 % cream Apply 1 application topically 2 (two) times daily. To inner thighs 30 g 0  . diclofenac sodium (VOLTAREN) 1 % GEL Apply 2 g topically 2 (two) times daily as needed (for pain).    . furosemide (LASIX) 40 MG tablet Take 2 tablets (80 mg total) by  mouth daily. 180 tablet 3  . gabapentin (NEURONTIN) 300 MG capsule Take 1 capsule (300 mg total) by mouth at bedtime. If tolerated after two days increase to 200 mg nightly 30 capsule 3  . glucose blood (ONE TOUCH ULTRA TEST) test strip USE TO CHECK BLOOD SUGAR 2 TIMES PER DAY 200 each 3  . ibuprofen (ADVIL,MOTRIN) 200 MG tablet Take 400 mg by mouth every 6 (six) hours as needed.    . insulin NPH Human (HUMULIN N,NOVOLIN N) 100 UNIT/ML injection Inject 0.42 mLs (42 Units total) into the skin daily. 20 mL 11  . Lancets (ONETOUCH ULTRASOFT) lancets Use to check blood sugar 2 times per day. Dx Code E11.9 200 each 2  . methimazole (TAPAZOLE) 5 MG tablet Take 1 tablet (5 mg total) by mouth 2 (two) times a week. Wednesday and Friday. 40 tablet 2  . Multiple Vitamins-Minerals (CENTRUM SILVER ULTRA WOMENS PO) Take 1 tablet by mouth daily.     . nitroGLYCERIN (NITROSTAT) 0.4 MG SL tablet Place 0.4 mg under the tongue every 5 (five) minutes as needed for chest pain. Reported on 01/28/2016    . potassium chloride SA (K-DUR,KLOR-CON) 20 MEQ tablet Take 1 tablet (66mEq) daily every other day alternating with 2 tablets (75mEq) daily every other day 135 tablet 3  . Turmeric 500 MG CAPS Take 500 mg by mouth daily.    . Vitamin D, Cholecalciferol, 1000 units TABS Take 2,000 Units by mouth daily.      No current facility-administered medications for this encounter.     Allergies  Allergen Reactions  . Amlodipine Besylate Other (See Comments)    REACTION: tingling in lips & gum edema  . Levaquin [Levofloxacin] Shortness Of Breath and Swelling    angioedema  . Lobster [Shellfish Allergy] Other (See Comments)    angioedema  . Penicillins Rash  . Valsartan Other (See Comments)    REACTION: angioedema  . Codeine Other (See Comments)    Mental status changes  . Lipitor [Atorvastatin]     weakness  . Oxycodone   . Statins     Made her too weak  . Tramadol Hcl Nausea And Vomiting   PHYSICAL EXAM: There were  no vitals filed for this visit. Wt Readings from Last 3 Encounters:  05/04/17 180 lb (81.6 kg)  04/11/17 183 lb 12 oz (83.3 kg)  03/16/17 177 lb (80.3 kg)    General:  Looks younger than stated age appearing female. NAD. Walked into clinic without difficulty.  HEENT: normal, atraumatic.  Neck: supple. JVP 5-6 cm. Carotids 2+ bilat; no bruits. No lymphadenopathy or thryomegaly appreciated. Cor: PMI nondisplaced. Irregular rate and rhythm. No murmurs, rubs or gallops.  Lungs: Clear in all lobes, no wheezing. Normal effort.  Abdomen: soft, Nontender, nondistended.  No hepatosplenomegaly. No bruits or masses. Good bowel sounds Extremities: no cyanosis, clubbing, rash.  Trace pedal edema.  Neuro: alert & oriented x 3, cranial nerves grossly intact. moves all 4 extremities w/o difficulty. Affect pleasant   ASSESSMENT & PLAN: 1) Acute on chronic diastolic HF with mild pulmonary venous HTN  -- Volume status ok, slightly elevated.  - Continue 80mg  lasix daily. Instructed her to take an extra 40mg  of Lasix if weight increases > 3 pounds in one day or 5 pounds in one week.  - Continue Kcl - Reinforced <2L fluid restriction.  2) Persistent atrial fib/flutter    This patients CHA2DS2-VASc Score and unadjusted Ischemic Stroke Rate (% per year) is equal to 7.2 % stroke rate/year from a score of 5 --Doing well with rate control. On Eliquis 5 bid.  Above score calculated as 2 points each if present [Age > 75, or Stroke/TIA/TE] - Denies melena, hematochezia.  3) Bradycardia - resolved 4) OSA - Follows with Dr. Halford Chessman - Continue CPAP  5) HTN  - Hypertensive today.  - Normally BP is not elevated. She has an appt. With her primary care doctor next week.   Follow up in 2 months. [facility]ist per her request.   Arbutus Leas, NP- C 2:35 PM

## 2017-05-09 ENCOUNTER — Encounter: Payer: Medicare Other | Admitting: Physical Therapy

## 2017-05-09 ENCOUNTER — Other Ambulatory Visit: Payer: Self-pay | Admitting: Internal Medicine

## 2017-05-11 ENCOUNTER — Encounter: Payer: Medicare Other | Admitting: Physical Therapy

## 2017-05-18 ENCOUNTER — Telehealth (HOSPITAL_COMMUNITY): Payer: Self-pay | Admitting: *Deleted

## 2017-05-18 NOTE — Telephone Encounter (Signed)
Patient called and left message on triage asking if we could switch her Eliquis to a cheaper medication.    I spoke with Doroteo Bradford our PharmD and she explained that once 3% of her total income was used towards medications then her insurance would cover her eliquis.  If she were to switch to coumadin she would be at a higher risk for bleeding and would also have to have her INR checked at least once a month.  I called patient back to discuss this but had to leave a VM asking for her to call us back.

## 2017-05-19 ENCOUNTER — Telehealth (HOSPITAL_COMMUNITY): Payer: Self-pay

## 2017-05-19 NOTE — Telephone Encounter (Signed)
Spoke with patient to f/u on yesterday's triage call regarding changing anticoag therapy. Advised per CHF clinical pharD [name] that only other option would be coumadin and explained 3% payment of meds before coverage with her benefits. Patient would like to remain on eliquis.  Samples given to patient, she will pick up today.  [name], RN

## 2017-05-20 ENCOUNTER — Other Ambulatory Visit: Payer: Self-pay | Admitting: *Deleted

## 2017-05-20 ENCOUNTER — Other Ambulatory Visit (HOSPITAL_COMMUNITY): Payer: Self-pay | Admitting: Cardiology

## 2017-05-20 MED ORDER — APIXABAN 5 MG PO TABS: 5.0000 mg | ORAL_TABLET | Freq: Two times a day (BID) | ORAL | 3 refills | Status: DC

## 2017-05-21 ENCOUNTER — Emergency Department (HOSPITAL_COMMUNITY): Payer: Medicare Other

## 2017-05-21 ENCOUNTER — Encounter (HOSPITAL_COMMUNITY): Payer: Self-pay

## 2017-05-21 ENCOUNTER — Inpatient Hospital Stay (HOSPITAL_COMMUNITY)
Admission: EM | Admit: 2017-05-21 | Discharge: 2017-05-27 | DRG: 193 | Disposition: A | Discharge status: Home / Self Care | Payer: Medicare Other | Attending: Internal Medicine | Admitting: Internal Medicine

## 2017-05-21 DIAGNOSIS — Z885 Allergy status to narcotic agent status: Secondary | ICD-10-CM | POA: Diagnosis not present

## 2017-05-21 DIAGNOSIS — R7981 Abnormal blood-gas level: Secondary | ICD-10-CM | POA: Diagnosis not present

## 2017-05-21 DIAGNOSIS — R05 Cough: Secondary | ICD-10-CM | POA: Diagnosis not present

## 2017-05-21 DIAGNOSIS — Z96651 Presence of right artificial knee joint: Secondary | ICD-10-CM | POA: Diagnosis present

## 2017-05-21 DIAGNOSIS — I2510 Atherosclerotic heart disease of native coronary artery without angina pectoris: Secondary | ICD-10-CM | POA: Diagnosis present

## 2017-05-21 DIAGNOSIS — J439 Emphysema, unspecified: Secondary | ICD-10-CM | POA: Diagnosis not present

## 2017-05-21 DIAGNOSIS — M810 Age-related osteoporosis without current pathological fracture: Secondary | ICD-10-CM | POA: Diagnosis present

## 2017-05-21 DIAGNOSIS — I482 Chronic atrial fibrillation: Secondary | ICD-10-CM | POA: Diagnosis not present

## 2017-05-21 DIAGNOSIS — T380X5A Adverse effect of glucocorticoids and synthetic analogues, initial encounter: Secondary | ICD-10-CM | POA: Diagnosis not present

## 2017-05-21 DIAGNOSIS — Z8679 Personal history of other diseases of the circulatory system: Secondary | ICD-10-CM | POA: Diagnosis not present

## 2017-05-21 DIAGNOSIS — G4733 Obstructive sleep apnea (adult) (pediatric): Secondary | ICD-10-CM

## 2017-05-21 DIAGNOSIS — Z888 Allergy status to other drugs, medicaments and biological substances status: Secondary | ICD-10-CM | POA: Diagnosis not present

## 2017-05-21 DIAGNOSIS — J440 Chronic obstructive pulmonary disease with acute lower respiratory infection: Secondary | ICD-10-CM | POA: Diagnosis not present

## 2017-05-21 DIAGNOSIS — J189 Pneumonia, unspecified organism: Principal | ICD-10-CM | POA: Diagnosis present

## 2017-05-21 DIAGNOSIS — I110 Hypertensive heart disease with heart failure: Secondary | ICD-10-CM | POA: Diagnosis present

## 2017-05-21 DIAGNOSIS — I252 Old myocardial infarction: Secondary | ICD-10-CM | POA: Diagnosis not present

## 2017-05-21 DIAGNOSIS — E785 Hyperlipidemia, unspecified: Secondary | ICD-10-CM | POA: Diagnosis not present

## 2017-05-21 DIAGNOSIS — Z79899 Other long term (current) drug therapy: Secondary | ICD-10-CM | POA: Diagnosis not present

## 2017-05-21 DIAGNOSIS — J441 Chronic obstructive pulmonary disease with (acute) exacerbation: Secondary | ICD-10-CM | POA: Diagnosis not present

## 2017-05-21 DIAGNOSIS — I5032 Chronic diastolic (congestive) heart failure: Secondary | ICD-10-CM | POA: Diagnosis not present

## 2017-05-21 DIAGNOSIS — E1165 Type 2 diabetes mellitus with hyperglycemia: Secondary | ICD-10-CM | POA: Diagnosis not present

## 2017-05-21 DIAGNOSIS — I5043 Acute on chronic combined systolic (congestive) and diastolic (congestive) heart failure: Secondary | ICD-10-CM | POA: Diagnosis present

## 2017-05-21 DIAGNOSIS — Z87891 Personal history of nicotine dependence: Secondary | ICD-10-CM

## 2017-05-21 DIAGNOSIS — E119 Type 2 diabetes mellitus without complications: Secondary | ICD-10-CM

## 2017-05-21 DIAGNOSIS — Z794 Long term (current) use of insulin: Secondary | ICD-10-CM

## 2017-05-21 DIAGNOSIS — J9621 Acute and chronic respiratory failure with hypoxia: Secondary | ICD-10-CM | POA: Diagnosis present

## 2017-05-21 DIAGNOSIS — Z8249 Family history of ischemic heart disease and other diseases of the circulatory system: Secondary | ICD-10-CM

## 2017-05-21 DIAGNOSIS — N183 Chronic kidney disease, stage 3 (moderate): Secondary | ICD-10-CM | POA: Diagnosis not present

## 2017-05-21 DIAGNOSIS — Z9989 Dependence on other enabling machines and devices: Secondary | ICD-10-CM | POA: Diagnosis not present

## 2017-05-21 DIAGNOSIS — Z91013 Allergy to seafood: Secondary | ICD-10-CM | POA: Diagnosis not present

## 2017-05-21 DIAGNOSIS — E1122 Type 2 diabetes mellitus with diabetic chronic kidney disease: Secondary | ICD-10-CM

## 2017-05-21 DIAGNOSIS — I4892 Unspecified atrial flutter: Secondary | ICD-10-CM | POA: Diagnosis not present

## 2017-05-21 DIAGNOSIS — E0590 Thyrotoxicosis, unspecified without thyrotoxic crisis or storm: Secondary | ICD-10-CM | POA: Diagnosis not present

## 2017-05-21 DIAGNOSIS — J302 Other seasonal allergic rhinitis: Secondary | ICD-10-CM | POA: Diagnosis not present

## 2017-05-21 DIAGNOSIS — Z7901 Long term (current) use of anticoagulants: Secondary | ICD-10-CM

## 2017-05-21 DIAGNOSIS — Z955 Presence of coronary angioplasty implant and graft: Secondary | ICD-10-CM | POA: Diagnosis not present

## 2017-05-21 DIAGNOSIS — Z881 Allergy status to other antibiotic agents status: Secondary | ICD-10-CM

## 2017-05-21 DIAGNOSIS — S0990XA Unspecified injury of head, initial encounter: Secondary | ICD-10-CM | POA: Diagnosis not present

## 2017-05-21 DIAGNOSIS — R0602 Shortness of breath: Secondary | ICD-10-CM | POA: Diagnosis not present

## 2017-05-21 DIAGNOSIS — I4891 Unspecified atrial fibrillation: Secondary | ICD-10-CM | POA: Diagnosis present

## 2017-05-21 DIAGNOSIS — D751 Secondary polycythemia: Secondary | ICD-10-CM | POA: Diagnosis present

## 2017-05-21 DIAGNOSIS — W19XXXA Unspecified fall, initial encounter: Secondary | ICD-10-CM | POA: Diagnosis not present

## 2017-05-21 DIAGNOSIS — Z833 Family history of diabetes mellitus: Secondary | ICD-10-CM

## 2017-05-21 DIAGNOSIS — R059 Cough, unspecified: Secondary | ICD-10-CM

## 2017-05-21 LAB — CBC
HCT: 51.9 % — ABNORMAL HIGH (ref 36.0–46.0)
Hemoglobin: 16.3 g/dL — ABNORMAL HIGH (ref 12.0–15.0)
MCH: 29.5 pg (ref 26.0–34.0)
MCHC: 31.4 g/dL (ref 30.0–36.0)
MCV: 94 fL (ref 78.0–100.0)
PLATELETS: 167 10*3/uL (ref 150–400)
RBC: 5.52 MIL/uL — AB (ref 3.87–5.11)
RDW: 13.9 % (ref 11.5–15.5)
WBC: 5.8 10*3/uL (ref 4.0–10.5)

## 2017-05-21 LAB — BASIC METABOLIC PANEL
Anion gap: 9 (ref 5–15)
BUN: 19 mg/dL (ref 6–20)
CO2: 27 mmol/L (ref 22–32)
CREATININE: 0.9 mg/dL (ref 0.44–1.00)
Calcium: 9.5 mg/dL (ref 8.9–10.3)
Chloride: 100 mmol/L — ABNORMAL LOW (ref 101–111)
GFR calc non Af Amer: 58 mL/min — ABNORMAL LOW (ref >60)
Glucose, Bld: 102 mg/dL — ABNORMAL HIGH (ref 65–99)
Potassium: 4.2 mmol/L (ref 3.5–5.1)
SODIUM: 136 mmol/L (ref 135–145)

## 2017-05-21 LAB — I-STAT TROPONIN, ED: TROPONIN I, POC: 0.02 ng/mL (ref 0.00–0.08)

## 2017-05-21 LAB — BRAIN NATRIURETIC PEPTIDE: B NATRIURETIC PEPTIDE 5: 297.8 pg/mL — AB (ref 0.0–100.0)

## 2017-05-21 MED ORDER — INSULIN ASPART 100 UNIT/ML ~~LOC~~ SOLN
0.0000 [IU] | Freq: Every day | SUBCUTANEOUS | Status: DC
  Administered 2017-05-22 – 2017-05-25 (×2): 4 [IU] via SUBCUTANEOUS
  Administered 2017-05-26: 2 [IU] via SUBCUTANEOUS

## 2017-05-21 MED ORDER — ONDANSETRON HCL 4 MG PO TABS: 4.0000 mg | ORAL_TABLET | Freq: Four times a day (QID) | ORAL | Status: DC | PRN

## 2017-05-21 MED ORDER — CLOTRIMAZOLE 1 % EX CREA
1.0000 "application " | TOPICAL_CREAM | Freq: Two times a day (BID) | CUTANEOUS | Status: DC | PRN
  Administered 2017-05-25: 1 via TOPICAL
  Filled 2017-05-21 (×2): qty 15

## 2017-05-21 MED ORDER — ONDANSETRON HCL 4 MG/2ML IJ SOLN
4.0000 mg | Freq: Four times a day (QID) | INTRAMUSCULAR | Status: DC | PRN
  Administered 2017-05-24: 4 mg via INTRAVENOUS
  Filled 2017-05-21: qty 2

## 2017-05-21 MED ORDER — INSULIN ASPART 100 UNIT/ML ~~LOC~~ SOLN
0.0000 [IU] | Freq: Three times a day (TID) | SUBCUTANEOUS | Status: DC
  Administered 2017-05-22: 15 [IU] via SUBCUTANEOUS
  Administered 2017-05-22: 8 [IU] via SUBCUTANEOUS
  Administered 2017-05-22: 5 [IU] via SUBCUTANEOUS
  Administered 2017-05-23: 3 [IU] via SUBCUTANEOUS
  Administered 2017-05-23: 5 [IU] via SUBCUTANEOUS
  Administered 2017-05-23 – 2017-05-24 (×2): 11 [IU] via SUBCUTANEOUS
  Administered 2017-05-24: 8 [IU] via SUBCUTANEOUS
  Administered 2017-05-24 – 2017-05-25 (×2): 5 [IU] via SUBCUTANEOUS
  Administered 2017-05-25: 3 [IU] via SUBCUTANEOUS
  Administered 2017-05-25: 8 [IU] via SUBCUTANEOUS
  Administered 2017-05-26: 5 [IU] via SUBCUTANEOUS
  Administered 2017-05-26: 15 [IU] via SUBCUTANEOUS

## 2017-05-21 MED ORDER — CARVEDILOL 12.5 MG PO TABS
12.5000 mg | ORAL_TABLET | Freq: Two times a day (BID) | ORAL | Status: DC
  Administered 2017-05-22 – 2017-05-27 (×11): 12.5 mg via ORAL
  Filled 2017-05-21 (×11): qty 1

## 2017-05-21 MED ORDER — METHYLPREDNISOLONE SODIUM SUCC 125 MG IJ SOLR
125.0000 mg | Freq: Once | INTRAMUSCULAR | Status: AC
  Administered 2017-05-21: 125 mg via INTRAVENOUS
  Filled 2017-05-21: qty 2

## 2017-05-21 MED ORDER — METHIMAZOLE 10 MG PO TABS
5.0000 mg | ORAL_TABLET | ORAL | Status: DC
  Administered 2017-05-23 – 2017-05-27 (×2): 5 mg via ORAL
  Filled 2017-05-21 (×2): qty 1

## 2017-05-21 MED ORDER — APIXABAN 5 MG PO TABS
5.0000 mg | ORAL_TABLET | Freq: Two times a day (BID) | ORAL | Status: DC
  Administered 2017-05-22 – 2017-05-27 (×12): 5 mg via ORAL
  Filled 2017-05-21 (×13): qty 1

## 2017-05-21 MED ORDER — DEXTROSE 5 % IV SOLN
500.0000 mg | Freq: Once | INTRAVENOUS | Status: AC
  Administered 2017-05-21: 500 mg via INTRAVENOUS
  Filled 2017-05-21: qty 500

## 2017-05-21 MED ORDER — INSULIN NPH (HUMAN) (ISOPHANE) 100 UNIT/ML ~~LOC~~ SUSP
40.0000 [IU] | Freq: Every day | SUBCUTANEOUS | Status: DC
  Administered 2017-05-22 – 2017-05-23 (×2): 40 [IU] via SUBCUTANEOUS
  Filled 2017-05-21 (×3): qty 10

## 2017-05-21 MED ORDER — POTASSIUM CHLORIDE CRYS ER 20 MEQ PO TBCR
20.0000 meq | EXTENDED_RELEASE_TABLET | Freq: Every day | ORAL | Status: DC
  Administered 2017-05-22 – 2017-05-27 (×6): 20 meq via ORAL
  Filled 2017-05-21 (×6): qty 1

## 2017-05-21 MED ORDER — DICLOFENAC SODIUM 1 % TD GEL
1.0000 "application " | Freq: Two times a day (BID) | TRANSDERMAL | Status: DC | PRN
  Filled 2017-05-21: qty 100

## 2017-05-21 MED ORDER — NITROGLYCERIN 0.4 MG SL SUBL: 0.4000 mg | SUBLINGUAL_TABLET | SUBLINGUAL | Status: DC | PRN

## 2017-05-21 MED ORDER — ACETAMINOPHEN 650 MG RE SUPP: 650.0000 mg | Freq: Four times a day (QID) | RECTAL | Status: DC | PRN

## 2017-05-21 MED ORDER — ACETAMINOPHEN 325 MG PO TABS
650.0000 mg | ORAL_TABLET | Freq: Four times a day (QID) | ORAL | Status: DC | PRN
  Administered 2017-05-22 – 2017-05-24 (×3): 650 mg via ORAL
  Filled 2017-05-21 (×3): qty 2

## 2017-05-21 MED ORDER — IPRATROPIUM-ALBUTEROL 0.5-2.5 (3) MG/3ML IN SOLN
3.0000 mL | RESPIRATORY_TRACT | Status: DC
  Administered 2017-05-21 (×2): 3 mL via RESPIRATORY_TRACT
  Filled 2017-05-21: qty 3

## 2017-05-21 MED ORDER — IPRATROPIUM-ALBUTEROL 0.5-2.5 (3) MG/3ML IN SOLN
3.0000 mL | Freq: Four times a day (QID) | RESPIRATORY_TRACT | Status: DC
  Administered 2017-05-22: 3 mL via RESPIRATORY_TRACT
  Filled 2017-05-21: qty 3

## 2017-05-21 MED ORDER — GUAIFENESIN ER 600 MG PO TB12
600.0000 mg | ORAL_TABLET | Freq: Two times a day (BID) | ORAL | Status: DC
  Administered 2017-05-22 (×2): 600 mg via ORAL
  Filled 2017-05-21 (×2): qty 1

## 2017-05-21 MED ORDER — GABAPENTIN 300 MG PO CAPS
300.0000 mg | ORAL_CAPSULE | Freq: Every day | ORAL | Status: DC
  Administered 2017-05-22 – 2017-05-26 (×6): 300 mg via ORAL
  Filled 2017-05-21 (×6): qty 1

## 2017-05-21 NOTE — ED Triage Notes (Signed)
Pt sent here from [facility] for SOB and CHF exacerbation. Pt states cough and SOB x 1 week. Pt states increased swelling in bilateral ankles. Pt sats 89% on RA at triage. Pt a/o x 4 at triage.

## 2017-05-21 NOTE — H&P (Signed)
History and Physical    [name] OZH:[id number] DOB: July 25, 1931 DOA: 05/21/2017  Referring MD/NP/PA: Dr. Billy Fischer PCP: [name], MD  Patient coming from: Home  Chief Complaint: "Couldn't catch my breath"  HPI: [name] is a 83 y.o. female with medical history significant of HTN, HLD, A. fib, CAD, COPD, DM type II; who presents with complaints of shortness of breath over last 2 days. Symptoms initially started with cough for the last 1 week. Patient notes she was initially coughing up big thick chunks of green and yellow mucus production. As the week went on, the sputum production and out. Over the last 2 days patient notes that she was unable to catch her breath and more congested. Reports associated symptoms of wheezing, orthopnea, possible weight gain of 4-5 pounds . At baseline patient does not require oxygen at home and normally sleeps with a CPAP mask, but was unable to this week and needed to sit all the way up her bed at night which is new. Denies any significant fever, chills, nausea, vomiting, abdominal pain, chest pain, or recent sick contacts to her knowledge. In the past patient reports being on Spiriva, but reports being off this medicine for several months now due to cost. Denies trying or having any inhalers rescue inhalers with which to try at home to relieve symptoms. Patient normally goes to the [facility] weekly and participates in aquatic yoga.  ED Course: Upon admission into the emergency department patient was seen to be afebrile, respirations 16-28, blood pressures and 10, O2 saturations 89% improved with nasal cannula oxygen. Emphysematous changes and chronic bronchitic changes. Patient given 125 mg of Solu-Medrol along with azithromycin.  Review of Systems: As per HPI otherwise 10 point review of systems negative.    Past Medical History:  Diagnosis Date  . Anemia    iron defficiency  . Anginal pain ([place])   . Arthritis    "knees, feet, hands;  joints" (05/27/2015)  . Asthma   . Atrial fibrillation or flutter    s/p RFCA 7/08;   s/p DCCV in past;   previously on amiodarone;  amio stopped due to lung toxicity  . CAD (coronary artery disease)    s/p NSTEMI tx with BMS to OM1 3/08;  cath 3/08: pOM 99% tx with PCI, pLAD 20%, ? mod stenosis at the AM  . CAP (community acquired pneumonia) 05/24/2015  . Chronic diastolic heart failure (HCC)    echo 11/11:  EF 55-60%, severe LVH, mod LAE, mild MR, mildly increased PASP  . COPD (chronic obstructive pulmonary disease) ([place])   . Degenerative joint disease   . Dystrophy, corneal stromal   . Heart murmur   . HLD (hyperlipidemia)   . HTN (hypertension)    essential nos  . Hypopotassemia    PMH of  . Muscle pain   . Myocardial infarction ([place]) 2008  . OSA on CPAP   . Osteoporosis   . Pneumonia 05/27/2015  . Protein calorie malnutrition ([place])   . Rash and nonspecific skin eruption    both arms,awaiting bio   dr Delman Cheadle  . Seasonal allergies   . Shortness of breath dyspnea   . Type II diabetes mellitus ([place])    Dr Loanne Drilling    Past Surgical History:  Procedure Laterality Date  . A FLUTTER ABLATION     Dr Lovena Le  . CATARACT EXTRACTION W/ INTRAOCULAR LENS  IMPLANT, BILATERAL Bilateral   . CHOLECYSTECTOMY    . COLONOSCOPY  6/07  2 polyps Dr. Kinnie Feil in [place]  . colonoscopy with polypectomy      X 2; [facility]  . CORNEAL TRANSPLANT Bilateral   . CORONARY ANGIOPLASTY WITH STENT PLACEMENT  02/2007   [facility] w/Dr Lovena Le  . EYE SURGERY    . gravid 2 para 2    . KNEE CARTILAGE SURGERY Right   . LEFT AND RIGHT HEART CATHETERIZATION WITH CORONARY ANGIOGRAM N/A 05/07/2014   Procedure: LEFT AND RIGHT HEART CATHETERIZATION WITH CORONARY ANGIOGRAM;  Surgeon: Peter M Martinique, MD;  Location: [facility] CATH LAB;  Service: Cardiovascular;  Laterality: N/A;  . TOTAL KNEE ARTHROPLASTY Right 06/28/2016   Procedure: TOTAL KNEE ARTHROPLASTY;  Surgeon: Frederik Pear, MD;  Location: [place];  Service: Orthopedics;  Laterality:  Right;     reports that she quit smoking about 48 years ago. Her smoking use included Cigarettes. She has a 4.50 pack-year smoking history. She has never used smokeless tobacco. She reports that she drinks alcohol. She reports that she does not use drugs.  Allergies  Allergen Reactions  . Amlodipine Besylate Other (See Comments)    REACTION: tingling in lips & gum edema  . Levaquin [Levofloxacin] Shortness Of Breath and Swelling    angioedema  . Lobster [Shellfish Allergy] Other (See Comments)    angioedema  . Penicillins Rash    Has patient had a PCN reaction causing immediate rash, facial/tongue/throat swelling, SOB or lightheadedness with hypotension: Yes Has patient had a PCN reaction causing severe rash involving mucus membranes or skin necrosis: No Has patient had a PCN reaction that required hospitalization: No Has patient had a PCN reaction occurring within the last 10 years: No If all of the above answers are "NO", then may proceed with Cephalosporin use.  . Valsartan Other (See Comments)    REACTION: angioedema  . Codeine Other (See Comments)    Mental status changes  . Lipitor [Atorvastatin] Other (See Comments)    weakness  . Oxycodone Other (See Comments)    hallucinations  . Statins     Made her too weak  . Tramadol Hcl Nausea And Vomiting    Family History  Problem Relation Age of Onset  . Diabetes Mother   . Hypertension Mother   . Transient ischemic attack Mother   . Arthritis Mother   . Heart attack Father 17  . Arthritis Father   . Hypertension Father   . Breast cancer Maternal Aunt   . Arthritis Maternal Aunt     Prior to Admission medications   Medication Sig Start Date End Date Taking? Authorizing Provider  acetaminophen (TYLENOL) 500 MG tablet Take 1,000 mg by mouth every 6 (six) hours as needed (pain).   Yes [provider]  apixaban (ELIQUIS) 5 MG TABS tablet Take 1 tablet (5 mg total) by mouth 2 (two) times daily. 05/20/17  Yes Larey Dresser, MD  carvedilol (COREG) 12.5 MG tablet Take 1 tablet (12.5 mg total) by mouth 2 (two) times daily with a meal. 03/07/17  Yes Bensimhon, Shaune Pascal, MD  cholecalciferol (VITAMIN D) 1000 units tablet Take 1,000 Units by mouth daily after supper.   Yes [provider]  clotrimazole (LOTRIMIN) 1 % cream Apply 1 application topically 2 (two) times daily. To inner thighs Patient taking differently: Apply 1 application topically 2 (two) times daily as needed (rash). To inner thighs 01/27/17  Yes Burns, [name], MD  Dextromethorphan-Guaifenesin 20-200 MG/10ML SOLN Take 10 mLs by mouth 4 (four) times daily as needed (cough/congestion).  Yes [provider]  diclofenac sodium (VOLTAREN) 1 % GEL Apply 1 application topically 2 (two) times daily as needed (knee pain).    Yes [provider]  furosemide (LASIX) 40 MG tablet Take 2 tablets (80 mg total) by mouth daily. 03/07/17  Yes Bensimhon, Shaune Pascal, MD  gabapentin (NEURONTIN) 100 MG capsule Take 300 mg by mouth at bedtime.   Yes [provider]  ibuprofen (ADVIL,MOTRIN) 200 MG tablet Take 400 mg by mouth every 6 (six) hours as needed for headache (pain).    Yes [provider]  insulin NPH Human (HUMULIN N,NOVOLIN N) 100 UNIT/ML injection Inject 0.42 mLs (42 Units total) into the skin daily. Patient taking differently: Inject 40 Units into the skin daily before breakfast.  01/24/17  Yes Renato Shin, MD  methimazole (TAPAZOLE) 5 MG tablet Take 1 tablet (5 mg total) by mouth 2 (two) times a week. Wednesday and Friday. Patient taking differently: Take 5 mg by mouth 2 (two) times a week. Monday and Friday 03/07/17  Yes Renato Shin, MD  Multiple Vitamin (MULTIVITAMIN WITH MINERALS) TABS tablet Take 1 tablet by mouth daily. Centrum Silver   Yes [provider]  nitroGLYCERIN (NITROSTAT) 0.4 MG SL tablet Place 0.4 mg under the tongue every 5 (five) minutes as needed for chest pain. Reported on 01/28/2016 05/02/14   Yes Evans Lance, MD  potassium chloride SA (K-DUR,KLOR-CON) 20 MEQ tablet Take 1 tablet (67mEq) daily every other day alternating with 2 tablets (12mEq) daily every other day Patient taking differently: Take 20-40 mEq by mouth See admin instructions. Take 1 tablet (36mEq) daily every other day alternating with 2 tablets (57mEq) daily every other day 03/07/17  Yes Bensimhon, Shaune Pascal, MD  PRESCRIPTION MEDICATION Inhale into the lungs at bedtime. CPAP   Yes [provider]  Turmeric 500 MG CAPS Take 500 mg by mouth daily after supper.    Yes [provider]  gabapentin (NEURONTIN) 300 MG capsule Take 1 capsule (300 mg total) by mouth at bedtime. If tolerated after two days increase to 200 mg nightly Patient not taking: Reported on 05/21/2017 03/16/17   Lyndal Pulley, DO  glucose blood (ONE TOUCH ULTRA TEST) test strip USE TO CHECK BLOOD SUGAR 2 TIMES PER DAY 04/04/17   Renato Shin, MD  Lancets [facility] ULTRASOFT) lancets Use to check blood sugar 2 times per day. Dx Code E11.9 03/15/17   Renato Shin, MD    Physical Exam: Constitutional: elderly female in mild respiratory distress Vitals:   05/21/17 2145 05/21/17 2215 05/21/17 2230 05/21/17 2300  BP: (!) 163/94 (!) 190/75 (!) 195/83 (!) 163/85  Pulse: 72 80 80 85  Resp: 17 (!) 26 (!) 21 (!) 23  Temp:      SpO2: 93% 96% 96% 99%   Eyes: PERRL, lids and conjunctivae normal ENMT: Mucous membranes are moist. Posterior pharynx clear of any exudate or lesions.Normal dentition.  Neck: normal, supple, no masses, no thyromegaly Respiratory: Expiratory wheezes appreciated with decreased overall air movement. Patient able to talk in short sentences. Currently on 2 L of nasal cannula oxygen..  Cardiovascular: Irregular irregular.  no murmurs / rubs / gallops. No extremity edema. 2+ pedal pulses. No carotid bruits.  Abdomen: no tenderness, no masses palpated. No hepatosplenomegaly. Bowel sounds positive.  Musculoskeletal: no  clubbing / cyanosis. No joint deformity upper and lower extremities. Good ROM, no contractures. Normal muscle tone.  Skin: no rashes, lesions, ulcers. No induration Neurologic: CN 2-12 grossly intact. Sensation intact,  DTR normal. Strength 5/5 in all 4.  Psychiatric: Normal judgment and insight. Alert and oriented x 3. Normal mood.     Labs on Admission: I have personally reviewed following labs and imaging studies  CBC:  Recent Labs Lab 05/21/17 1934  WBC 5.8  HGB 16.3*  HCT 51.9*  MCV 94.0  PLT 119   Basic Metabolic Panel:  Recent Labs Lab 05/21/17 1934  NA 136  K 4.2  CL 100*  CO2 27  GLUCOSE 102*  BUN 19  CREATININE 0.90  CALCIUM 9.5   GFR: CrCl cannot be calculated (Unknown ideal weight.). Liver Function Tests: No results for input(s): AST, ALT, ALKPHOS, BILITOT, PROT, ALBUMIN in the last 168 hours. No results for input(s): LIPASE, AMYLASE in the last 168 hours. No results for input(s): AMMONIA in the last 168 hours. Coagulation Profile: No results for input(s): INR, PROTIME in the last 168 hours. Cardiac Enzymes: No results for input(s): CKTOTAL, CKMB, CKMBINDEX, TROPONINI in the last 168 hours. BNP (last 3 results) No results for input(s): PROBNP in the last 8760 hours. HbA1C: No results for input(s): HGBA1C in the last 72 hours. CBG: No results for input(s): GLUCAP in the last 168 hours. Lipid Profile: No results for input(s): CHOL, HDL, LDLCALC, TRIG, CHOLHDL, LDLDIRECT in the last 72 hours. Thyroid Function Tests: No results for input(s): TSH, T4TOTAL, FREET4, T3FREE, THYROIDAB in the last 72 hours. Anemia Panel: No results for input(s): VITAMINB12, FOLATE, FERRITIN, TIBC, IRON, RETICCTPCT in the last 72 hours. Urine analysis:    Component Value Date/Time   COLORURINE YELLOW 08/29/2016 1632   APPEARANCEUR CLOUDY (A) 08/29/2016 1632   LABSPEC 1.027 08/29/2016 1632   PHURINE 5.5 08/29/2016 1632   GLUCOSEU 100 (A) 08/29/2016 1632   GLUCOSEU 100  (A) 08/19/2015 1440   HGBUR LARGE (A) 08/29/2016 1632   BILIRUBINUR NEGATIVE 08/29/2016 1632   BILIRUBINUR negative 08/26/2016 1137   KETONESUR NEGATIVE 08/29/2016 1632   PROTEINUR 100 (A) 08/29/2016 1632   UROBILINOGEN 0.2 08/26/2016 1137   UROBILINOGEN 0.2 08/19/2015 1440   NITRITE POSITIVE (A) 08/29/2016 1632   LEUKOCYTESUR MODERATE (A) 08/29/2016 1632   Sepsis Labs: No results found for this or any previous visit (from the past 240 hour(s)).   Radiological Exams on Admission: Dg Chest 2 View  Result Date: 05/21/2017 CLINICAL DATA:  Productive cough. Shortness of breath. CHF. Atrial fibrillation. Diabetes. EXAM: CHEST  2 VIEW COMPARISON:  08/19/2015 FINDINGS: Suggestion of emphysematous changes and central chronic bronchitic changes in the lungs. No airspace disease or consolidation. No blunting of costophrenic angles. No pneumothorax. Mediastinal contours appear intact. Normal heart size and pulmonary vascularity. Calcified and tortuous aorta. Degenerative changes in the spine and shoulders. IMPRESSION: Emphysematous changes and chronic bronchitic changes suggested in the lungs. No evidence of active pulmonary disease. Electronically Signed   By: Lucienne Capers M.D.   On: 05/21/2017 22:24    EKG: Independently reviewed. Atrial flutter  Assessment/Plan COPD exacerbation with bronchitis/acute respiratory failure with hypoxia: Acute. She presents with worsening shortness of breath O2 saturations less than 90 on admission requiring nasal cannula oxygen. Chest x-ray showing emphysematous changes and chronic bronchitic changes - Admit to a telemetry bed - Follow-up sputum cultures  - Continue azithromycin - DuoNeb's 4 times a day and prn SOB/Wheezing - Solumedrol 60mg  IV q 8hrs - Will likely need prescriptions for inhalers at discharge - Care management consult for assistance with medication needs  Atrial fibrillation/flutter on chronic anticoagulation: Stable - Continue Eliquis and  coreg  Diastolic CHF with elevated BNP: Patient reports mild increase of weight 4-5 pounds. BNP mildly elevated at Per review of records patient's weight should be around 181. On admission patient found to be 175. - Strict ins and outs and daily weights  Essential hypertension - Continue Coreg and furosemide  Hyperthyroidism - Check free T3 - Continue Tapazole  Diabetes mellitus type 2 - Hypoglycemic protocols - Continue home Lantus dose  - CBGs every before meals and at bedtime with moderate SSI  - Adjust  regimen as needed  CAD - Continue aspirin  Polycythemia - Repeat CBC this a.m.  OSA on CPAP - Cpap per RT DVT prophylaxis:  Eliquis  Code Status: Full  Family Communication: Discussed clinic care with the patient and family present at bedside Disposition Plan: Possible discharge home in 2-3 days  Consults called: none Admission status: inpatient   Norval Morton MD [facility] Pager [phone]  If 7PM-7AM, please contact night-coverage [email or web address] Password [email or web address]  05/21/2017, 11:12 PM

## 2017-05-21 NOTE — ED Notes (Signed)
Pt. Transported to xray 

## 2017-05-21 NOTE — ED Notes (Signed)
Patient transported to X-ray 

## 2017-05-21 NOTE — ED Provider Notes (Signed)
[facility] DEPT Provider Note   CSN: [id number] Arrival date & time: 05/21/17  1919     History   Chief Complaint Chief Complaint  Patient presents with  . Shortness of Breath  . Cough    HPI [name] is a 83 y.o. female.  HPI   Cough for one week, was coughing up "chunks" before, last few days coughing up mucus. Dark green and yellow at first, yesterday and today more light yellow. Nose congestion.  Couldn't sleep last night because of shortness of breath, congestion.  This AM had increasing dyspnea, at 330PM went to urgent care.  Gave O2 and XR, breathing treatment, sent to hospital for lasix.  No fevers. No chest pain, no abdominal pain.    Feels has had ankle swelling for days. Does weights daily but not today-yesterday did not have significant change in weights, has not had increase in lasix.   Not taking spiriva anymore because of costs. Did feel better after breathing treatments there.   Past Medical History:  Diagnosis Date  . Anemia    iron defficiency  . Anginal pain ([place])   . Arthritis    "knees, feet, hands; joints" (05/27/2015)  . Asthma   . Atrial fibrillation or flutter    s/p RFCA 7/08;   s/p DCCV in past;   previously on amiodarone;  amio stopped due to lung toxicity  . CAD (coronary artery disease)    s/p NSTEMI tx with BMS to OM1 3/08;  cath 3/08: pOM 99% tx with PCI, pLAD 20%, ? mod stenosis at the AM  . CAP (community acquired pneumonia) 05/24/2015  . Chronic diastolic heart failure (HCC)    echo 11/11:  EF 55-60%, severe LVH, mod LAE, mild MR, mildly increased PASP  . COPD (chronic obstructive pulmonary disease) ([place])   . Degenerative joint disease   . Dystrophy, corneal stromal   . Heart murmur   . HLD (hyperlipidemia)   . HTN (hypertension)    essential nos  . Hypopotassemia    PMH of  . Muscle pain   . Myocardial infarction ([place]) 2008  . OSA on CPAP   . Osteoporosis   . Pneumonia 05/27/2015  . Protein calorie malnutrition  ([place])   . Rash and nonspecific skin eruption    both arms,awaiting bio   dr Delman Cheadle  . Seasonal allergies   . Shortness of breath dyspnea   . Type II diabetes mellitus ([place])    Dr Loanne Drilling    Patient Active Problem List   Diagnosis Date Noted  . COPD exacerbation ([place]) 05/21/2017  . Arthritis of left sacroiliac joint ([place]) 05/04/2017  . Osteopenia 02/17/2017  . Piriformis syndrome of left side 02/16/2017  . Acquired leg length discrepancy 02/16/2017  . COPD (chronic obstructive pulmonary disease) ([place]) 01/27/2017  . Lumbar radiculopathy, acute 01/27/2017  . Rash 01/27/2017  . Granuloma annulare 07/27/2016  . Numbness of fingers 07/27/2016  . Primary osteoarthritis of right knee 06/27/2016  . Diabetes ([place]) 03/07/2016  . Acute renal failure superimposed on stage 3 chronic kidney disease ([place])   . Sepsis ([place]) 05/24/2015  . OSA on CPAP 08/28/2014  . Chronic diastolic heart failure ([place]) 07/30/2014  . Nocturnal hypoxemia 07/09/2014  . Dyspnea 05/15/2014  . Pulmonary HTN ([place]) 05/15/2014  . Crescendo angina ([place]) 05/02/2014  . Band keratopathy 01/16/2014  . 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 Epiphora due to insufficient drainage 06/13/2013  . Hyperthyroidism 11/04/2011  . Multinodular goiter 10/14/2011  . Atrophy,  Fuchs' 09/29/2011  . ATRIAL FIBRILLATION 06/03/2010  . CHF 02/17/2010  . VITILIGO 11/07/2009  . SINOATRIAL NODE DYSFUNCTION 06/16/2009  . Iron deficiency anemia 01/02/2009  . ANEMIA, PERNICIOUS 11/20/2008  . Atrial flutter ([place]) 06/25/2008  . HYPERLIPIDEMIA 01/29/2008  . Coronary atherosclerosis 01/29/2008  . HTN (hypertension) 10/30/2007    Past Surgical History:  Procedure Laterality Date  . A FLUTTER ABLATION     Dr Lovena Le  . CATARACT EXTRACTION W/ INTRAOCULAR LENS  IMPLANT, BILATERAL Bilateral   . CHOLECYSTECTOMY    . COLONOSCOPY  6/07   2 polyps Dr. Kinnie Feil in [place]  . colonoscopy with polypectomy      X 2;  GI  . CORNEAL TRANSPLANT  Bilateral   . CORONARY ANGIOPLASTY WITH STENT PLACEMENT  02/2007   BMS w/Dr Lovena Le  . EYE SURGERY    . gravid 2 para 2    . KNEE CARTILAGE SURGERY Right   . LEFT AND RIGHT HEART CATHETERIZATION WITH CORONARY ANGIOGRAM N/A 05/07/2014   Procedure: LEFT AND RIGHT HEART CATHETERIZATION WITH CORONARY ANGIOGRAM;  Surgeon: Peter M Martinique, MD;  Location: [facility] CATH LAB;  Service: Cardiovascular;  Laterality: N/A;  . TOTAL KNEE ARTHROPLASTY Right 06/28/2016   Procedure: TOTAL KNEE ARTHROPLASTY;  Surgeon: Frederik Pear, MD;  Location: [facility];  Service: Orthopedics;  Laterality: Right;    OB History    No data available       Home Medications    Prior to Admission medications   Medication Sig Start Date End Date Taking? Authorizing Provider  acetaminophen (TYLENOL) 500 MG tablet Take 1,000 mg by mouth every 6 (six) hours as needed (pain).   Yes [provider]  apixaban (ELIQUIS) 5 MG TABS tablet Take 1 tablet (5 mg total) by mouth 2 (two) times daily. 05/20/17  Yes Larey Dresser, MD  carvedilol (COREG) 12.5 MG tablet Take 1 tablet (12.5 mg total) by mouth 2 (two) times daily with a meal. 03/07/17  Yes Bensimhon, Shaune Pascal, MD  cholecalciferol (VITAMIN D) 1000 units tablet Take 1,000 Units by mouth daily after supper.   Yes [provider]  clotrimazole (LOTRIMIN) 1 % cream Apply 1 application topically 2 (two) times daily. To inner thighs Patient taking differently: Apply 1 application topically 2 (two) times daily as needed (rash). To inner thighs 01/27/17  Yes Burns, [name], MD  Dextromethorphan-Guaifenesin 20-200 MG/10ML SOLN Take 10 mLs by mouth 4 (four) times daily as needed (cough/congestion).   Yes [provider]  diclofenac sodium (VOLTAREN) 1 % GEL Apply 1 application topically 2 (two) times daily as needed (knee pain).    Yes [provider]  furosemide (LASIX) 40 MG tablet Take 2 tablets (80 mg total) by mouth daily. 03/07/17  Yes Bensimhon, Shaune Pascal, MD    gabapentin (NEURONTIN) 100 MG capsule Take 300 mg by mouth at bedtime.   Yes [provider]  ibuprofen (ADVIL,MOTRIN) 200 MG tablet Take 400 mg by mouth every 6 (six) hours as needed for headache (pain).    Yes [provider]  insulin NPH Human (HUMULIN N,NOVOLIN N) 100 UNIT/ML injection Inject 0.42 mLs (42 Units total) into the skin daily. Patient taking differently: Inject 40 Units into the skin daily before breakfast.  01/24/17  Yes Renato Shin, MD  methimazole (TAPAZOLE) 5 MG tablet Take 1 tablet (5 mg total) by mouth 2 (two) times a week. Wednesday and Friday. Patient taking differently: Take 5 mg by mouth 2 (two) times a week. Monday and Friday  03/07/17  Yes Renato Shin, MD  Multiple Vitamin (MULTIVITAMIN WITH MINERALS) TABS tablet Take 1 tablet by mouth daily. Centrum Silver   Yes [provider]  nitroGLYCERIN (NITROSTAT) 0.4 MG SL tablet Place 0.4 mg under the tongue every 5 (five) minutes as needed for chest pain. Reported on 01/28/2016 05/02/14  Yes Evans Lance, MD  potassium chloride SA (K-DUR,KLOR-CON) 20 MEQ tablet Take 1 tablet (83mEq) daily every other day alternating with 2 tablets (25mEq) daily every other day Patient taking differently: Take 20-40 mEq by mouth See admin instructions. Take 1 tablet (32mEq) daily every other day alternating with 2 tablets (39mEq) daily every other day 03/07/17  Yes Bensimhon, Shaune Pascal, MD  PRESCRIPTION MEDICATION Inhale into the lungs at bedtime. CPAP   Yes [provider]  Turmeric 500 MG CAPS Take 500 mg by mouth daily after supper.    Yes [provider]  gabapentin (NEURONTIN) 300 MG capsule Take 1 capsule (300 mg total) by mouth at bedtime. If tolerated after two days increase to 200 mg nightly Patient not taking: Reported on 05/21/2017 03/16/17   Lyndal Pulley, DO  glucose blood (ONE TOUCH ULTRA TEST) test strip USE TO CHECK BLOOD SUGAR 2 TIMES PER DAY 04/04/17   Renato Shin, MD  Lancets  [facility] ULTRASOFT) lancets Use to check blood sugar 2 times per day. Dx Code E11.9 03/15/17   Renato Shin, MD    Family History Family History  Problem Relation Age of Onset  . Diabetes Mother   . Hypertension Mother   . Transient ischemic attack Mother   . Arthritis Mother   . Heart attack Father 2  . Arthritis Father   . Hypertension Father   . Breast cancer Maternal Aunt   . Arthritis Maternal Aunt     Social History Social History  Substance Use Topics  . Smoking status: Former Smoker    Packs/day: 0.25    Years: 18.00    Types: Cigarettes    Quit date: 12/27/1968  . Smokeless tobacco: Never Used     Comment: smoked [place], up to 1 pp week  . Alcohol use Yes     Comment: occ     Allergies   Amlodipine besylate; Levaquin [levofloxacin]; Lobster [shellfish allergy]; Penicillins; Valsartan; Codeine; Lipitor [atorvastatin]; Oxycodone; Statins; and Tramadol hcl   Review of Systems Review of Systems  Constitutional: Positive for fatigue. Negative for fever.  HENT: Positive for congestion. Negative for sore throat.   Eyes: Negative for visual disturbance.  Respiratory: Positive for cough, shortness of breath and wheezing.   Cardiovascular: Negative for chest pain.  Gastrointestinal: Negative for abdominal pain, nausea and vomiting.  Genitourinary: Negative for difficulty urinating.  Musculoskeletal: Negative for back pain and neck pain.  Skin: Negative for rash.  Neurological: Negative for syncope and headaches.     Physical Exam Updated Vital Signs BP (!) 155/78 (BP Location: Left Arm)   Pulse 81   Temp 97.5 F (36.4 C) (Oral)   Resp (!) 22   Ht 5\' 2"  (1.575 m)   Wt 79.6 kg (175 lb 8 oz) Comment: a scale  SpO2 95%   BMI 32.10 kg/m   Physical Exam  Constitutional: She is oriented to person, place, and time. She appears well-developed and well-nourished. No distress.  HENT:  Head: Normocephalic and atraumatic.  Eyes: Conjunctivae and EOM are  normal.  Neck: Normal range of motion. No JVD present.  Cardiovascular: Normal rate, regular rhythm, normal heart sounds and intact  distal pulses.  Exam reveals no gallop and no friction rub.   No murmur heard. Pulmonary/Chest: Effort normal. No respiratory distress. She has wheezes. She has no rales.  Abdominal: Soft. She exhibits no distension. There is no tenderness. There is no guarding.  Musculoskeletal: She exhibits edema (trace-1+ bilaterally). She exhibits no tenderness.  Neurological: She is alert and oriented to person, place, and time.  Skin: Skin is warm and dry. No rash noted. She is not diaphoretic. No erythema.  Nursing note and vitals reviewed.    ED Treatments / Results  Labs (all labs ordered are listed, but only abnormal results are displayed) Labs Reviewed  BASIC METABOLIC PANEL - Abnormal; Notable for the following:       Result Value   Chloride 100 (*)    Glucose, Bld 102 (*)    GFR calc non Af Amer 58 (*)    All other components within normal limits  CBC - Abnormal; Notable for the following:    RBC 5.52 (*)    Hemoglobin 16.3 (*)    HCT 51.9 (*)    All other components within normal limits  BRAIN NATRIURETIC PEPTIDE - Abnormal; Notable for the following:    B Natriuretic Peptide 297.8 (*)    All other components within normal limits  CBC - Abnormal; Notable for the following:    RBC 5.12 (*)    HCT 48.1 (*)    All other components within normal limits  BASIC METABOLIC PANEL - Abnormal; Notable for the following:    Glucose, Bld 264 (*)    BUN 23 (*)    GFR calc non Af Amer 53 (*)    All other components within normal limits  GLUCOSE, CAPILLARY - Abnormal; Notable for the following:    Glucose-Capillary 315 (*)    All other components within normal limits  GLUCOSE, CAPILLARY - Abnormal; Notable for the following:    Glucose-Capillary 221 (*)    All other components within normal limits  GLUCOSE, CAPILLARY - Abnormal; Notable for the following:     Glucose-Capillary 358 (*)    All other components within normal limits  CULTURE, EXPECTORATED SPUTUM-ASSESSMENT  STREP PNEUMONIAE URINARY ANTIGEN  LEGIONELLA PNEUMOPHILA SEROGP 1 UR AG  T3, FREE  I-STAT TROPOININ, ED    EKG  EKG Interpretation  Date/Time:  Saturday May 21 2017 19:37:57 EDT Ventricular Rate:  89 PR Interval:    QRS Duration: 106 QT Interval:  384 QTC Calculation: 467 R Axis:   -87 Text Interpretation:  Atrial flutter Artifact Left axis deviation ST & T wave abnormality, consider lateral ischemia Abnormal ECG No significant change since last tracing Confirmed by [facility] MD, Kymberley Raz ([id number]) on 05/21/2017 10:01:26 PM       Radiology Dg Chest 2 View  Result Date: 05/21/2017 CLINICAL DATA:  Productive cough. Shortness of breath. CHF. Atrial fibrillation. Diabetes. EXAM: CHEST  2 VIEW COMPARISON:  08/19/2015 FINDINGS: Suggestion of emphysematous changes and central chronic bronchitic changes in the lungs. No airspace disease or consolidation. No blunting of costophrenic angles. No pneumothorax. Mediastinal contours appear intact. Normal heart size and pulmonary vascularity. Calcified and tortuous aorta. Degenerative changes in the spine and shoulders. IMPRESSION: Emphysematous changes and chronic bronchitic changes suggested in the lungs. No evidence of active pulmonary disease. Electronically Signed   By: Lucienne Capers M.D.   On: 05/21/2017 22:24    Procedures Procedures (including critical care time)  Medications Ordered in ED Medications  methimazole (TAPAZOLE) tablet 5 mg (not administered)  clotrimazole (LOTRIMIN) 1 % cream 1 application (not administered)  diclofenac sodium (VOLTAREN) 1 % transdermal gel 1 application (not administered)  nitroGLYCERIN (NITROSTAT) SL tablet 0.4 mg (not administered)  carvedilol (COREG) tablet 12.5 mg (12.5 mg Oral Not Given 05/22/17 0823)  potassium chloride SA (K-DUR,KLOR-CON) CR tablet 20 mEq (20 mEq Oral Given 05/22/17 1111)    apixaban (ELIQUIS) tablet 5 mg (5 mg Oral Given 05/22/17 1111)  gabapentin (NEURONTIN) capsule 300 mg (300 mg Oral Given 05/22/17 0120)  insulin NPH Human (HUMULIN N,NOVOLIN N) injection 40 Units (40 Units Subcutaneous Given 05/22/17 0855)  ondansetron (ZOFRAN) tablet 4 mg (not administered)    Or  ondansetron (ZOFRAN) injection 4 mg (not administered)  acetaminophen (TYLENOL) tablet 650 mg (not administered)    Or  acetaminophen (TYLENOL) suppository 650 mg (not administered)  guaiFENesin (MUCINEX) 12 hr tablet 600 mg (600 mg Oral Given 05/22/17 1111)  insulin aspart (novoLOG) injection 0-15 Units (15 Units Subcutaneous Given 05/22/17 1204)  insulin aspart (novoLOG) injection 0-5 Units (4 Units Subcutaneous Given 05/22/17 0120)  ipratropium-albuterol (DUONEB) 0.5-2.5 (3) MG/3ML nebulizer solution 3 mL (3 mLs Nebulization Given 05/22/17 0111)  methylPREDNISolone sodium succinate (SOLU-MEDROL) 125 mg/2 mL injection 60 mg (60 mg Intravenous Given 05/22/17 0503)  furosemide (LASIX) tablet 80 mg (80 mg Oral Given 05/22/17 1111)  azithromycin (ZITHROMAX) 500 mg in dextrose 5 % 250 mL IVPB (not administered)  ipratropium-albuterol (DUONEB) 0.5-2.5 (3) MG/3ML nebulizer solution 3 mL (not administered)  methylPREDNISolone sodium succinate (SOLU-MEDROL) 125 mg/2 mL injection 125 mg (125 mg Intravenous Given 05/21/17 2248)  azithromycin (ZITHROMAX) 500 mg in dextrose 5 % 250 mL IVPB (0 mg Intravenous Stopped 05/21/17 2348)     Initial Impression / Assessment and Plan / ED Course  I have reviewed the triage vital signs and the nursing notes.  Pertinent labs & imaging results that were available during my care of the patient were reviewed by me and considered in my medical decision making (see chart for details).    83 yo female with history of atrial fibrillation/flutter, chronic diastolic heart failure, COPD, DM presents from urgent care for concern for cough, chest pain, shortness of breath.   Differential  diagnosis for dyspnea includes ACS, PE, COPD exacerbation, CHF exacerbation, anemia, pneumonia.  Chest x-ray was done which showed chronic findings of bronchitis. EKG was evaluated by me which showed atrial flutter without significant changes from prior.  BNP was in 200s.  Clinical history, exam most consistent with COPD exacerbation.  Given solumedrol, nebulizer treatments. Will admit given new O2 requirement.    Final Clinical Impressions(s) / ED Diagnoses   Final diagnoses:  COPD exacerbation [facility])    New Prescriptions Current Discharge Medication List       Gareth Morgan, MD 05/22/17 1310

## 2017-05-22 LAB — BASIC METABOLIC PANEL
Anion gap: 8 (ref 5–15)
BUN: 23 mg/dL — ABNORMAL HIGH (ref 6–20)
CALCIUM: 9.3 mg/dL (ref 8.9–10.3)
CO2: 28 mmol/L (ref 22–32)
CREATININE: 0.97 mg/dL (ref 0.44–1.00)
Chloride: 103 mmol/L (ref 101–111)
GFR, EST NON AFRICAN AMERICAN: 53 mL/min — AB (ref >60)
Glucose, Bld: 264 mg/dL — ABNORMAL HIGH (ref 65–99)
Potassium: 4.4 mmol/L (ref 3.5–5.1)
SODIUM: 139 mmol/L (ref 135–145)

## 2017-05-22 LAB — GLUCOSE, CAPILLARY
GLUCOSE-CAPILLARY: 358 mg/dL — AB (ref 65–99)
GLUCOSE-CAPILLARY: 289 mg/dL — AB (ref 65–99)
Glucose-Capillary: 179 mg/dL — ABNORMAL HIGH (ref 65–99)
Glucose-Capillary: 221 mg/dL — ABNORMAL HIGH (ref 65–99)
Glucose-Capillary: 315 mg/dL — ABNORMAL HIGH (ref 65–99)

## 2017-05-22 LAB — STREP PNEUMONIAE URINARY ANTIGEN: Strep Pneumo Urinary Antigen: NEGATIVE

## 2017-05-22 LAB — CBC
HCT: 48.1 % — ABNORMAL HIGH (ref 36.0–46.0)
HEMOGLOBIN: 15 g/dL (ref 12.0–15.0)
MCH: 29.3 pg (ref 26.0–34.0)
MCHC: 31.2 g/dL (ref 30.0–36.0)
MCV: 93.9 fL (ref 78.0–100.0)
PLATELETS: 154 10*3/uL (ref 150–400)
RBC: 5.12 MIL/uL — ABNORMAL HIGH (ref 3.87–5.11)
RDW: 13.9 % (ref 11.5–15.5)
WBC: 6 10*3/uL (ref 4.0–10.5)

## 2017-05-22 MED ORDER — METHYLPREDNISOLONE SODIUM SUCC 125 MG IJ SOLR
60.0000 mg | Freq: Three times a day (TID) | INTRAMUSCULAR | Status: DC
  Administered 2017-05-22 – 2017-05-23 (×4): 60 mg via INTRAVENOUS
  Filled 2017-05-22 (×5): qty 2

## 2017-05-22 MED ORDER — DEXTROSE 5 % IV SOLN
500.0000 mg | INTRAVENOUS | Status: DC
  Administered 2017-05-22: 500 mg via INTRAVENOUS
  Filled 2017-05-22: qty 500

## 2017-05-22 MED ORDER — IPRATROPIUM-ALBUTEROL 0.5-2.5 (3) MG/3ML IN SOLN
3.0000 mL | RESPIRATORY_TRACT | Status: DC | PRN
Start: 2017-05-22 — End: 2017-05-27
  Administered 2017-05-22 – 2017-05-24 (×2): 3 mL via RESPIRATORY_TRACT
  Filled 2017-05-22 (×2): qty 3

## 2017-05-22 MED ORDER — IPRATROPIUM-ALBUTEROL 0.5-2.5 (3) MG/3ML IN SOLN
3.0000 mL | Freq: Three times a day (TID) | RESPIRATORY_TRACT | Status: DC
  Administered 2017-05-22 – 2017-05-27 (×13): 3 mL via RESPIRATORY_TRACT
  Filled 2017-05-22 (×14): qty 3

## 2017-05-22 MED ORDER — FUROSEMIDE 80 MG PO TABS
80.0000 mg | ORAL_TABLET | Freq: Every day | ORAL | Status: DC
  Administered 2017-05-22 – 2017-05-23 (×2): 80 mg via ORAL
  Filled 2017-05-22 (×2): qty 1

## 2017-05-22 NOTE — Progress Notes (Signed)
Patient stable during7 a to 7 p shift, rhythm atrial flutter rate controlled in the 70's and 80's.  Patient up to chair and bathroom, does express surprise that she has so much shortness of breath with activity.  Continues to require 1 liter of oxygen, tapered down from 2.  Denies pain.

## 2017-05-22 NOTE — Progress Notes (Signed)
   05/22/17 0105  Vitals  Temp 97.5 F (36.4 C)  Temp Source Oral  BP (!) 142/74  BP Location Left Arm  BP Method Automatic  Patient Position (if appropriate) Sitting  Pulse Rate 83  Pulse Rate Source Dinamap  Cardiac Rhythm Atrial fibrillation  Resp (!) 22  Oxygen Therapy  SpO2 90 %  O2 Device Nasal Cannula  O2 Flow Rate (L/min) 3 L/min  PCA/Epidural/Spinal Assessment  Respiratory Pattern Regular;Unlabored;Dyspnea at rest;Dyspnea with exertion  Height and Weight  Height 5\' 2"  (1.575 m)  Weight 79.6 kg (175 lb 8 oz) (a scale)  Type of Scale Used Standing  Type of Weight Actual  BSA (Calculated - sq m) 1.87 sq meters  BMI (Calculated) 32.2  Weight in (lb) to have BMI = 25 136.4  Admitted pt to rm 3E09 from ED, pt alert and oriented, denied pain at this time, oriented to room, call bell placed within reach.

## 2017-05-22 NOTE — Progress Notes (Signed)
PROGRESS NOTE    [name]  ZOX:[id number] DOB: 06-Jun-1931 DOA: 05/21/2017 PCP: [name], MD   Brief Narrative: 83 y.o. female with medical history significant of HTN, HLD, A. fib, CAD, COPD, DM type II; who presents with complaints of shortness of breath over last 2 days. Symptoms initially started with cough for the last 1 week.  Patient was hypoxic in the ER requiring oxygen. Treated with Solu-Medrol and azithromycin. Admitted for further evaluation.  Assessment & Plan:  # Acute COPD exacerbation with chronic bronchitis changes and acute respiratory failure with hypoxia: -Patient is still has bilateral diffuse wheezing. Reported that her shortness of breath is better. Still having cough. Continue azithromycin, bronchodilators and Solu-Medrol. Try to wean oxygen down gradually. Continue to provide supportive care.  #Atrial fibrillation/flutter, exact type unknown on chronic anticoagulation: Continue Coreg,eliquis. Monitor heart rate  #Chronic diastolic congestive heart failure: Looks euvolemic on exam. Continue oral Lasix, Coreg. Low-salt diet.  #Essential hypertension : Monitor blood pressure. Continue current antihypertensive medication.  #Hyperthyroidism: Continue methimazole  #Type 2 diabetes: Continue current insulin regimen. I noticed that patient had elevated blood sugar this morning. Continue to monitor for now. Watch for hypoglycemic episode.  #Coronary artery disease: Continue Coreg, Lasix, eliquis. Patient has no chest pain.  #Polycythemia: Likely in the setting of COPD. Continue supportive care  #OSA on CPAP: Continue CPAP  PT OT evaluation.   Principal Problem:   COPD exacerbation ([place]) Active Problems:   ATRIAL FIBRILLATION   Hyperthyroidism   Chronic diastolic heart failure (HCC)   OSA on CPAP   Diabetes ([place])  DVT prophylaxis:eliquis Code Status:full code Family Communication: No family at bedside Disposition Plan: Likely discharge home  in 1-2 days    Consultants:   None  Procedures: None Antimicrobials: Azithromycin  Subjective: Seen and examined at bedside. Reported feeling better but is still having cough, wheeze and requiring oxygen. Denied chest pain, headache, nausea, vomiting, abdominal pain.  Objective: Vitals:   05/22/17 0000 05/22/17 0105 05/22/17 1108 05/22/17 1322  BP: (!) 154/65 (!) 142/74 (!) 155/78   Pulse: 78 83 81   Resp: (!) 23 (!) 22    Temp:  97.5 F (36.4 C)    TempSrc:  Oral    SpO2: 94% 90% 95% 93%  Weight:  79.6 kg (175 lb 8 oz)    Height:  5\' 2"  (1.575 m)      Intake/Output Summary (Last 24 hours) at 05/22/17 1327 Last data filed at 05/22/17 1322  Gross per 24 hour  Intake              480 ml  Output              650 ml  Net             -170 ml   Filed Weights   05/22/17 0105  Weight: 79.6 kg (175 lb 8 oz)    Examination:  General exam: Pleasant female sitting on chair comfortably, not in distress Respiratory system: Bilateral diffuse expiratory wheeze. Respiratory effort normal.  Cardiovascular system: S1 & S2 heard, RRR.  No pedal edema. Gastrointestinal system: Abdomen is nondistended, soft and nontender. Normal bowel sounds heard. Central nervous system: Alert and oriented. No focal neurological deficits. Extremities: Symmetric 5 x 5 power. Skin: No rashes, lesions or ulcers Psychiatry: Judgement and insight appear normal. Mood & affect appropriate.     Data Reviewed: I have personally reviewed following labs and imaging studies  CBC:  Recent Labs Lab  05/21/17 1934 05/22/17 0446  WBC 5.8 6.0  HGB 16.3* 15.0  HCT 51.9* 48.1*  MCV 94.0 93.9  PLT 167 937   Basic Metabolic Panel:  Recent Labs Lab 05/21/17 1934 05/22/17 0446  NA 136 139  K 4.2 4.4  CL 100* 103  CO2 27 28  GLUCOSE 102* 264*  BUN 19 23*  CREATININE 0.90 0.97  CALCIUM 9.5 9.3   GFR: Estimated Creatinine Clearance: 42.9 mL/min (by C-G formula based on SCr of 0.97 mg/dL). Liver  Function Tests: No results for input(s): AST, ALT, ALKPHOS, BILITOT, PROT, ALBUMIN in the last 168 hours. No results for input(s): LIPASE, AMYLASE in the last 168 hours. No results for input(s): AMMONIA in the last 168 hours. Coagulation Profile: No results for input(s): INR, PROTIME in the last 168 hours. Cardiac Enzymes: No results for input(s): CKTOTAL, CKMB, CKMBINDEX, TROPONINI in the last 168 hours. BNP (last 3 results) No results for input(s): PROBNP in the last 8760 hours. HbA1C: No results for input(s): HGBA1C in the last 72 hours. CBG:  Recent Labs Lab 05/22/17 0052 05/22/17 0806 05/22/17 1154  GLUCAP 315* 221* 358*   Lipid Profile: No results for input(s): CHOL, HDL, LDLCALC, TRIG, CHOLHDL, LDLDIRECT in the last 72 hours. Thyroid Function Tests: No results for input(s): TSH, T4TOTAL, FREET4, T3FREE, THYROIDAB in the last 72 hours. Anemia Panel: No results for input(s): VITAMINB12, FOLATE, FERRITIN, TIBC, IRON, RETICCTPCT in the last 72 hours. Sepsis Labs: No results for input(s): PROCALCITON, LATICACIDVEN in the last 168 hours.  No results found for this or any previous visit (from the past 240 hour(s)).       Radiology Studies: Dg Chest 2 View  Result Date: 05/21/2017 CLINICAL DATA:  Productive cough. Shortness of breath. CHF. Atrial fibrillation. Diabetes. EXAM: CHEST  2 VIEW COMPARISON:  08/19/2015 FINDINGS: Suggestion of emphysematous changes and central chronic bronchitic changes in the lungs. No airspace disease or consolidation. No blunting of costophrenic angles. No pneumothorax. Mediastinal contours appear intact. Normal heart size and pulmonary vascularity. Calcified and tortuous aorta. Degenerative changes in the spine and shoulders. IMPRESSION: Emphysematous changes and chronic bronchitic changes suggested in the lungs. No evidence of active pulmonary disease. Electronically Signed   By: Lucienne Capers M.D.   On: 05/21/2017 22:24        Scheduled  Meds: . apixaban  5 mg Oral BID  . carvedilol  12.5 mg Oral BID WC  . furosemide  80 mg Oral Daily  . gabapentin  300 mg Oral QHS  . guaiFENesin  600 mg Oral BID  . insulin aspart  0-15 Units Subcutaneous TID WC  . insulin aspart  0-5 Units Subcutaneous QHS  . insulin NPH Human  40 Units Subcutaneous QAC breakfast  . ipratropium-albuterol  3 mL Nebulization TID  . [START ON 05/23/2017] methimazole  5 mg Oral Once per day on Mon Fri  . methylPREDNISolone (SOLU-MEDROL) injection  60 mg Intravenous Q8H  . potassium chloride SA  20 mEq Oral Daily   Continuous Infusions: . azithromycin       LOS: 1 day    Gari Trovato Tanna Furry, MD [facility] Pager [phone]  If 7PM-7AM, please contact night-coverage [email or web address] Password [email or web address] 05/22/2017, 1:27 PM

## 2017-05-22 NOTE — Evaluation (Signed)
Physical Therapy Evaluation Patient Details Name: [name] MRN: [id number] DOB: 1931/06/04 Today's Date: 05/22/2017   History of Present Illness  Pt is an 83 yo female admitted through ED with COPD exacerbation. Pt went to urgent care due to severe dyspnea and O2 sats were found to be 87%. Pt was diagnosed with COPD exacerbation. PMH significant for HTN, HLD, A-fib, CAD, COPD, hyperthyroid, DM and CHF.   Clinical Impression  Pt presents with the above diagnosis and below deficits for therapy evaluation. Prior to admission, pt was completely independent and living with her daughter in a single level home. Pt was able to perform gait with a [place] and was still able to drive herself. Pt reports and significant decline in her function over the past month in which she had multiple family activities which were more physically demanding. Pt is able to perform gait with [place] and bed mobs with [place] this session. O2 sats do stay below 90% throughout session and only increase to 91% following on 2L of O2. Pt will benefit from increased gait distance in order to assess tolerance to activity without O2. Pt continues to require acute PT in order to address the below deficits before DC to venue recommended below.     Follow Up Recommendations Home health PT    Equipment Recommendations  None recommended by PT    Recommendations for Other Services       Precautions / Restrictions Precautions Precautions: Fall Restrictions Weight Bearing Restrictions: No      Mobility  Bed Mobility Overal bed mobility: Modified Independent             General bed mobility comments: able to get EOB without any hands on assistance, just increased time to complete  Transfers Overall transfer level: Needs assistance Equipment used: Straight cane Transfers: Sit to/from Stand Sit to Stand: Min guard         General transfer comment: Min gaurd for safety from the  EOB  Ambulation/Gait Ambulation/Gait assistance: Min guard Ambulation Distance (Feet): 50 Feet Assistive device: Straight cane Gait Pattern/deviations: Step-through pattern;Decreased step length - right;Decreased step length - left Gait velocity: decreased Gait velocity interpretation: Below normal speed for age/gender General Gait Details: good seqeuncing with . cues for holding cane appropriately.   Stairs            Wheelchair Mobility    Modified Rankin (Stroke Patients Only)       Balance Overall balance assessment: Needs assistance Sitting-balance support: No upper extremity supported;Feet supported Sitting balance-Leahy Scale: Good     Standing balance support: Single extremity supported;During functional activity Standing balance-Leahy Scale: Fair                               Pertinent Vitals/Pain Pain Assessment: No/denies pain    Home Living Family/patient expects to be discharged to:: Private residence Living Arrangements: Children Available Help at Discharge: Family;Available PRN/intermittently Type of Home: Apartment Home Access: Level entry     Home Layout: One level Home Equipment: Shower seat;Bedside commode;Cane - single point      Prior Function Level of Independence: Independent;Independent with assistive device(s)         Comments: used cane when she went out, still driving     Hand Dominance   Dominant Hand: Right    Extremity/Trunk Assessment   Upper Extremity Assessment Upper Extremity Assessment: Overall WFL for tasks assessed    Lower Extremity  Assessment Lower Extremity Assessment: Generalized weakness    Cervical / Trunk Assessment Cervical / Trunk Assessment: Normal  Communication   Communication: No difficulties  Cognition Arousal/Alertness: Awake/alert Behavior During Therapy: WFL for tasks assessed/performed Overall Cognitive Status: Within Functional Limits for tasks assessed                                         General Comments General comments (skin integrity, edema, etc.): Pt's O2 sats consistenly stay below 87% without O2  in place and only increase to 90% with o2 in place.     Exercises     Assessment/Plan    PT Assessment Patient needs continued PT services  PT Problem List Decreased activity tolerance;Decreased balance;Decreased mobility;Decreased knowledge of use of DME       PT Treatment Interventions Gait training;Functional mobility training;Therapeutic activities;Therapeutic exercise;Balance training;Patient/family education    PT Goals (Current goals can be found in the Care Plan section)  Acute Rehab PT Goals Patient Stated Goal: to get back to normal PT Goal Formulation: With patient Time For Goal Achievement: 05/29/17 Potential to Achieve Goals: Good    Frequency Min 3X/week   Barriers to discharge        Co-evaluation               AM-PAC PT "6 Clicks" Daily Activity  Outcome Measure Difficulty turning over in bed (including adjusting bedclothes, sheets and blankets)?: None Difficulty moving from lying on back to sitting on the side of the bed? : None Difficulty sitting down on and standing up from a chair with arms (e.g., wheelchair, bedside commode, etc,.)?: A Little Help needed moving to and from a bed to chair (including a wheelchair)?: A Little Help needed walking in hospital room?: A Little Help needed climbing 3-5 steps with a railing? : A Little 6 Click Score: 20    End of Session Equipment Utilized During Treatment: Gait belt Activity Tolerance: Patient tolerated treatment well Patient left: in chair;with call bell/phone within reach;with chair alarm set Nurse Communication: Mobility status PT Visit Diagnosis: Muscle weakness (generalized) (M62.81);Difficulty in walking, not elsewhere classified (R26.2)    Time: 1660-6301 PT Time Calculation (min) (ACUTE ONLY): 21 min   Charges:   PT  Evaluation $PT Eval Moderate Complexity: 1 Procedure     PT G Codes:        Scheryl Marten PT, DPT  [phone]   Jacqulyn Liner Sloan Leiter 05/22/2017, 1:16 PM

## 2017-05-23 ENCOUNTER — Inpatient Hospital Stay (HOSPITAL_COMMUNITY): Payer: Medicare Other

## 2017-05-23 LAB — GLUCOSE, CAPILLARY
GLUCOSE-CAPILLARY: 181 mg/dL — AB (ref 65–99)
Glucose-Capillary: 231 mg/dL — ABNORMAL HIGH (ref 65–99)
Glucose-Capillary: 319 mg/dL — ABNORMAL HIGH (ref 65–99)
Glucose-Capillary: 194 mg/dL — ABNORMAL HIGH (ref 65–99)

## 2017-05-23 LAB — LEGIONELLA PNEUMOPHILA SEROGP 1 UR AG: L. pneumophila Serogp 1 Ur Ag: NEGATIVE

## 2017-05-23 LAB — T3, FREE: T3, Free: 2.3 pg/mL (ref 2.0–4.4)

## 2017-05-23 MED ORDER — GUAIFENESIN-DM 100-10 MG/5ML PO SYRP
5.0000 mL | ORAL_SOLUTION | ORAL | Status: DC | PRN
  Administered 2017-05-23 – 2017-05-27 (×8): 5 mL via ORAL
  Filled 2017-05-23 (×8): qty 5

## 2017-05-23 MED ORDER — METHYLPREDNISOLONE SODIUM SUCC 125 MG IJ SOLR
60.0000 mg | Freq: Two times a day (BID) | INTRAMUSCULAR | Status: DC
  Administered 2017-05-23 – 2017-05-27 (×8): 60 mg via INTRAVENOUS
  Filled 2017-05-23 (×8): qty 2

## 2017-05-23 MED ORDER — INSULIN NPH (HUMAN) (ISOPHANE) 100 UNIT/ML ~~LOC~~ SUSP
50.0000 [IU] | Freq: Every day | SUBCUTANEOUS | Status: DC
  Administered 2017-05-24 – 2017-05-25 (×2): 50 [IU] via SUBCUTANEOUS
  Filled 2017-05-23: qty 10

## 2017-05-23 MED ORDER — AZITHROMYCIN 500 MG PO TABS
500.0000 mg | ORAL_TABLET | ORAL | Status: DC
  Administered 2017-05-23 – 2017-05-26 (×4): 500 mg via ORAL
  Filled 2017-05-23 (×4): qty 1

## 2017-05-23 MED ORDER — FUROSEMIDE 10 MG/ML IJ SOLN
40.0000 mg | Freq: Once | INTRAMUSCULAR | Status: AC
  Administered 2017-05-23: 40 mg via INTRAVENOUS
  Filled 2017-05-23: qty 4

## 2017-05-23 NOTE — Care Management Note (Signed)
Case Management Note  Patient Details  Name: [name] MRN: [id number] Date of Birth: 05-08-1931  Subjective/Objective:          CM following for progression and d/c planning.  Noted pt has family support and lives in private residence. Noted CM referral for possible assistance with Spiriva.           Action/Plan: 05/23/2017 Noted that pt has insurance with a medication plan , will discuss possibly of pt applying for Medicaid to assist with meds. As she is insured we will be unable to provide a [place] letter. Will search manufacture site for possible programs for pt to use to assist with meds. Also noted that pt is concerned about cost of Eliquis, however as she was already on this medication at the time of admission we are unable to provide a card. Will again search for a program that may be available to the pt for medication support.    Expected Discharge Date:                  Expected Discharge Plan:  [place]  In-House Referral:     Discharge planning Services  CM Consult, Medication Assistance  Post Acute Care Choice:  Home Health Choice offered to:     DME Arranged:    DME Agency:     HH Arranged:    HH Agency:     Status of Service:  In process, will continue to follow  If discussed at Long Length of Stay Meetings, dates discussed:    Additional Comments:  Adron Bene, RN 05/23/2017, 8:54 AM

## 2017-05-23 NOTE — Progress Notes (Addendum)
At 6pm, NT called RN to room and pt was found on the floor. According to the patient her leg was up on the recliner she wanted to put her leg down  and she pulled the lever of the recliner and she slipped on the floor and her right ear touched table very gently, she refused it to fall and said "I just slipped" but RN said  to her that as long as her bottom touch on the floor, it is considered Fall.  RN reinforced the education to the patient. Patient verbalize the understanding , MD aware, charge nurse informed, post fall assessment done, safety portal been filled out, will continue to monitor the patient

## 2017-05-23 NOTE — Discharge Instructions (Addendum)
°  For assistance with your inhaler, Spiriva, you may call this number for information from the manufacturer of this medication.  For assistance call [phone]  Monday-Friday 9am to [place] may also apply for assistance online at [facility] patient assistance.      Information on my medicine - ELIQUIS (apixaban)  This medication education was reviewed with me or my healthcare representative as part of my discharge preparation.  The pharmacist that spoke with me during my hospital stay was:  Einar Grad, [facility]  Why was Eliquis prescribed for you? Eliquis was prescribed for you to reduce the risk of a blood clot forming that can cause a stroke if you have a medical condition called atrial fibrillation (a type of irregular heartbeat).  What do You need to know about Eliquis ? Take your Eliquis TWICE DAILY - one tablet in the morning and one tablet in the evening with or without food. If you have difficulty swallowing the tablet whole please discuss with your pharmacist how to take the medication safely.  Take Eliquis exactly as prescribed by your doctor and DO NOT stop taking Eliquis without talking to the doctor who prescribed the medication.  Stopping may increase your risk of developing a stroke.  Refill your prescription before you run out.  After discharge, you should have regular check-up appointments with your healthcare provider that is prescribing your Eliquis.  In the future your dose may need to be changed if your kidney function or weight changes by a significant amount or as you get older.  What do you do if you miss a dose? If you miss a dose, take it as soon as you remember on the same day and resume taking twice daily.  Do not take more than one dose of ELIQUIS at the same time to make up a missed dose.  Important Safety Information A possible side effect of Eliquis is bleeding. You should call your healthcare provider right away if you experience any of the  following: ? Bleeding from an injury or your nose that does not stop. ? Unusual colored urine (red or dark brown) or unusual colored stools (red or black). ? Unusual bruising for unknown reasons. ? A serious fall or if you hit your head (even if there is no bleeding).  Some medicines may interact with Eliquis and might increase your risk of bleeding or clotting while on Eliquis. To help avoid this, consult your healthcare provider or pharmacist prior to using any new prescription or non-prescription medications, including herbals, vitamins, non-steroidal anti-inflammatory drugs (NSAIDs) and supplements.  This website has more information on Eliquis (apixaban): http://www.eliquis.com/eliquis/home

## 2017-05-23 NOTE — Progress Notes (Signed)
Physical Therapy Treatment Patient Details Name: [name] MRN: [id number] DOB: 01/27/1931 Today's Date: 05/23/2017    History of Present Illness Pt is an 83 yo female admitted through ED with COPD exacerbation. Pt went to urgent care due to severe dyspnea and O2 sats were found to be 87%. Pt was diagnosed with COPD exacerbation. PMH significant for HTN, HLD, A-fib, CAD, COPD, hyperthyroid, DM and CHF.     PT Comments    Patient tolerated increased gait distance with SpO2 between 90-92% on 2L O2 via [place]. Pt required several rest breaks with one seated break due to 2/4 DOE. Continue to progress as tolerated with anticipated d/c home with HHPT.   Follow Up Recommendations  Home health PT     Equipment Recommendations  None recommended by PT    Recommendations for Other Services       Precautions / Restrictions Precautions Precautions: Fall Restrictions Weight Bearing Restrictions: No    Mobility  Bed Mobility               General bed mobility comments: pt sitting in chair upon arrival  Transfers Overall transfer level: Needs assistance Equipment used: Straight cane Transfers: Sit to/from Stand Sit to Stand: Supervision         General transfer comment: supervision for safety  Ambulation/Gait Ambulation/Gait assistance: Min guard Ambulation Distance (Feet): 150 Feet Assistive device: Straight cane Gait Pattern/deviations: Step-through pattern;Decreased stride length;Trunk flexed Gait velocity: decreased   General Gait Details: cues for posture; slow, steady gait; 3 standing rest breaks and one seated rest break due to 2/4 DOE; SpO2 >90% on 2L O2 via [place]   Stairs            Wheelchair Mobility    Modified Rankin (Stroke Patients Only)       Balance Overall balance assessment: Needs assistance Sitting-balance support: No upper extremity supported;Feet supported Sitting balance-Leahy Scale: Good     Standing balance support: Single  extremity supported;During functional activity Standing balance-Leahy Scale: Fair                              Cognition Arousal/Alertness: Awake/alert Behavior During Therapy: WFL for tasks assessed/performed Overall Cognitive Status: Within Functional Limits for tasks assessed                                        Exercises      General Comments General comments (skin integrity, edema, etc.): SpO2 92% on 1.5L O2 via [place] prior to mobility      Pertinent Vitals/Pain Pain Assessment: Faces Faces Pain Scale: Hurts little more Pain Location: ribs and lower back Pain Descriptors / Indicators: Discomfort;Sore Pain Intervention(s): Monitored during session    Home Living                      Prior Function            PT Goals (current goals can now be found in the care plan section) Acute Rehab PT Goals Patient Stated Goal: to get back to normal PT Goal Formulation: With patient Time For Goal Achievement: 05/29/17 Potential to Achieve Goals: Good Progress towards PT goals: Progressing toward goals    Frequency    Min 3X/week      PT Plan Current plan remains appropriate    Co-evaluation  AM-PAC PT "6 Clicks" Daily Activity  Outcome Measure  Difficulty turning over in bed (including adjusting bedclothes, sheets and blankets)?: None Difficulty moving from lying on back to sitting on the side of the bed? : None Difficulty sitting down on and standing up from a chair with arms (e.g., wheelchair, bedside commode, etc,.)?: A Little Help needed moving to and from a bed to chair (including a wheelchair)?: A Little Help needed walking in hospital room?: A Little Help needed climbing 3-5 steps with a railing? : A Little 6 Click Score: 20    End of Session Equipment Utilized During Treatment: Gait belt Activity Tolerance: Patient tolerated treatment well Patient left: in chair;with call bell/phone within reach;with  chair alarm set Nurse Communication: Mobility status PT Visit Diagnosis: Muscle weakness (generalized) (M62.81);Difficulty in walking, not elsewhere classified (R26.2)     Time: 6979-4801 PT Time Calculation (min) (ACUTE ONLY): 19 min  Charges:  $Gait Training: 8-22 mins                    G Codes:       Earney Navy, PTA Pager: [phone]     Darliss Cheney 05/23/2017, 9:05 AM

## 2017-05-23 NOTE — Progress Notes (Addendum)
PROGRESS NOTE    [name]  PJA:[id number] DOB: 06-Apr-1931 DOA: 05/21/2017 PCP: [name], MD   Brief Narrative: 83 y.o. female with medical history significant of HTN, HLD, A. fib, CAD, COPD, DM type II; who presents with complaints of shortness of breath over last 2 days. Symptoms initially started with cough for the last 1 week.  Patient was hypoxic in the ER requiring oxygen. Treated with Solu-Medrol and azithromycin. Admitted for further evaluation.  Assessment & Plan:  # Acute COPD exacerbation with chronic bronchitis changes and acute respiratory failure with hypoxia: -Patient with coughing mild shortness of breath and not feeling well today. Obtained repeat chest x-ray today which showed no acute finding. Continue to treat with Solu-Medrol, bronchodilators and azithromycin. Try to wean down oxygen gradually. Continue supportive care. Added Mucinex DM for the cough.  #Atrial fibrillation/flutter, exact type unknown on chronic anticoagulation: Continue Coreg,eliquis. Monitor heart rate  #Chronic diastolic congestive heart failure:  Continue oral Lasix, Coreg. Low-salt diet. -Chest x-ray with no congestion.  -Order a dose of IV Lasix. Monitor electrolytes  #Essential hypertension : Monitor blood pressure. Continue current antihypertensive medication.  #Hyperthyroidism: Continue methimazole  #Type 2 diabetes with hyperglycemia: Blood sugar level elevated because of steroid use. I increased the dose of NPH insulin to 50 units. Continue sliding scale. Monitor blood sugar level.   #Coronary artery disease: Continue Coreg, Lasix, eliquis. Patient has no chest pain.  #Polycythemia: Likely in the setting of COPD. Continue supportive care  #OSA on CPAP: Continue CPAP  PT OT evaluation.   Principal Problem:   COPD exacerbation ([place]) Active Problems:   ATRIAL FIBRILLATION   Hyperthyroidism   Chronic diastolic heart failure (H[facility])   OSA on CPAP   Diabetes  ([place])  DVT prophylaxis:eliquis Code Status:full code Family Communication: No family at bedside Disposition Plan: Likely discharge home in 1-2 days    Consultants:   None  Procedures: None Antimicrobials: Azithromycin  Subjective: Seen and examined at bedside. Reported coughing and muscle pain. Denied headache, dizziness, nausea or vomiting. Has generalized weakness. Objective: Vitals:   05/22/17 2155 05/23/17 0624 05/23/17 0921 05/23/17 1151  BP: (!) 167/75 (!) 154/84  (!) 153/56  Pulse: 94 78  67  Resp: 20 18  18   Temp: 98.6 F (37 C) 98.5 F (36.9 C)  97.6 F (36.4 C)  TempSrc: Oral Oral  Oral  SpO2: 93% 95% 92% 92%  Weight:  78.6 kg (173 lb 3.2 oz)    Height:        Intake/Output Summary (Last 24 hours) at 05/23/17 1322 Last data filed at 05/23/17 1312  Gross per 24 hour  Intake              700 ml  Output             1575 ml  Net             -875 ml   Filed Weights   05/22/17 0105 05/23/17 0624  Weight: 79.6 kg (175 lb 8 oz) 78.6 kg (173 lb 3.2 oz)    Examination:  General exam: Elderly female sitting on chair, coughing Respiratory system: Bibasal crackles and mild wheeze. Respiratory effort normal Cardiovascular system: Regular rate rhythm, S1-S2 normal. No pedal edema  Gastrointestinal system: Abdomen is nondistended, soft and nontender. Normal bowel sounds heard. Central nervous system: Alert and oriented. No focal neurological deficits. Extremities: Symmetric 5 x 5 power. Skin: No rashes, lesions or ulcers Psychiatry: Judgement and insight appear normal.  Mood & affect appropriate.     Data Reviewed: I have personally reviewed following labs and imaging studies  CBC:  Recent Labs Lab 05/21/17 1934 05/22/17 0446  WBC 5.8 6.0  HGB 16.3* 15.0  HCT 51.9* 48.1*  MCV 94.0 93.9  PLT 167 366   Basic Metabolic Panel:  Recent Labs Lab 05/21/17 1934 05/22/17 0446  NA 136 139  K 4.2 4.4  CL 100* 103  CO2 27 28  GLUCOSE 102* 264*  BUN 19  23*  CREATININE 0.90 0.97  CALCIUM 9.5 9.3   GFR: Estimated Creatinine Clearance: 42.7 mL/min (by C-G formula based on SCr of 0.97 mg/dL). Liver Function Tests: No results for input(s): AST, ALT, ALKPHOS, BILITOT, PROT, ALBUMIN in the last 168 hours. No results for input(s): LIPASE, AMYLASE in the last 168 hours. No results for input(s): AMMONIA in the last 168 hours. Coagulation Profile: No results for input(s): INR, PROTIME in the last 168 hours. Cardiac Enzymes: No results for input(s): CKTOTAL, CKMB, CKMBINDEX, TROPONINI in the last 168 hours. BNP (last 3 results) No results for input(s): PROBNP in the last 8760 hours. HbA1C: No results for input(s): HGBA1C in the last 72 hours. CBG:  Recent Labs Lab 05/22/17 1154 05/22/17 1618 05/22/17 2150 05/23/17 0804 05/23/17 1118  GLUCAP 358* 289* 179* 231* 319*   Lipid Profile: No results for input(s): CHOL, HDL, LDLCALC, TRIG, CHOLHDL, LDLDIRECT in the last 72 hours. Thyroid Function Tests:  Recent Labs  05/22/17 0736  T3FREE 2.3   Anemia Panel: No results for input(s): VITAMINB12, FOLATE, FERRITIN, TIBC, IRON, RETICCTPCT in the last 72 hours. Sepsis Labs: No results for input(s): PROCALCITON, LATICACIDVEN in the last 168 hours.  No results found for this or any previous visit (from the past 240 hour(s)).       Radiology Studies: Dg Chest 2 View  Result Date: 05/23/2017 CLINICAL DATA:  Productive cough and shortness of breath for 1 week EXAM: CHEST  2 VIEW COMPARISON:  05/21/2017 FINDINGS: Cardiac shadow is within normal limits. Aortic calcifications are again noted. Lungs are well aerated bilaterally. Mild elevation the right hemidiaphragm is again seen. No acute bony abnormality is seen. IMPRESSION: No acute abnormality noted. Electronically Signed   By: Inez Catalina M.D.   On: 05/23/2017 11:09   Dg Chest 2 View  Result Date: 05/21/2017 CLINICAL DATA:  Productive cough. Shortness of breath. CHF. Atrial  fibrillation. Diabetes. EXAM: CHEST  2 VIEW COMPARISON:  08/19/2015 FINDINGS: Suggestion of emphysematous changes and central chronic bronchitic changes in the lungs. No airspace disease or consolidation. No blunting of costophrenic angles. No pneumothorax. Mediastinal contours appear intact. Normal heart size and pulmonary vascularity. Calcified and tortuous aorta. Degenerative changes in the spine and shoulders. IMPRESSION: Emphysematous changes and chronic bronchitic changes suggested in the lungs. No evidence of active pulmonary disease. Electronically Signed   By: Lucienne Capers M.D.   On: 05/21/2017 22:24        Scheduled Meds: . apixaban  5 mg Oral BID  . azithromycin  500 mg Oral Q24H  . carvedilol  12.5 mg Oral BID WC  . furosemide  80 mg Oral Daily  . gabapentin  300 mg Oral QHS  . insulin aspart  0-15 Units Subcutaneous TID WC  . insulin aspart  0-5 Units Subcutaneous QHS  . insulin NPH Human  40 Units Subcutaneous QAC breakfast  . ipratropium-albuterol  3 mL Nebulization TID  . methimazole  5 mg Oral Once per day on Mon Fri  .  methylPREDNISolone (SOLU-MEDROL) injection  60 mg Intravenous Q8H  . potassium chloride SA  20 mEq Oral Daily   Continuous Infusions:    LOS: 2 days    Korin Hartwell Tanna Furry, MD [facility] Pager [phone]  If 7PM-7AM, please contact night-coverage [email or web address] Password [email or web address] 05/23/2017, 1:22 PM

## 2017-05-23 NOTE — Progress Notes (Signed)
Pt is alert and oriented, with improvement in breathing, Receiving IV solumedrol and Zithromax, CPap at Night, with  Concerns about the cost of eliquis. Need a discount card or change to affordable anticoagulant.

## 2017-05-23 NOTE — Progress Notes (Signed)
Patient agrees to keep bed alarm on and will use her call bell when ever she needs to get up. Yellow bracelet applied, and fall risk contract signed and in room. Patient states that she still has a headache from when she hit her ear, but that she received tylenol so it's not bad. Still rated pain a 6, patient also cheering for her favorite basketball team on  tv. Will continue to monitor.

## 2017-05-23 NOTE — Progress Notes (Signed)
Pt is sitting in a recliner but chair alarm found turned off, Rn asked patient and she said that she turned that off as she does not like it to turn it on, technically she refused to put that on, but RN reinforce the education and turn this on before leaving her room again and asked her to keep it on, will continue to monitor

## 2017-05-24 ENCOUNTER — Telehealth: Payer: Self-pay | Admitting: Endocrinology

## 2017-05-24 ENCOUNTER — Inpatient Hospital Stay (HOSPITAL_COMMUNITY): Payer: Medicare Other

## 2017-05-24 LAB — GLUCOSE, CAPILLARY
Glucose-Capillary: 204 mg/dL — ABNORMAL HIGH (ref 65–99)
Glucose-Capillary: 345 mg/dL — ABNORMAL HIGH (ref 65–99)
Glucose-Capillary: 251 mg/dL — ABNORMAL HIGH (ref 65–99)
Glucose-Capillary: 156 mg/dL — ABNORMAL HIGH (ref 65–99)

## 2017-05-24 LAB — BASIC METABOLIC PANEL
ANION GAP: 12 (ref 5–15)
BUN: 37 mg/dL — ABNORMAL HIGH (ref 6–20)
CALCIUM: 9.4 mg/dL (ref 8.9–10.3)
CHLORIDE: 99 mmol/L — AB (ref 101–111)
CO2: 26 mmol/L (ref 22–32)
Creatinine, Ser: 0.94 mg/dL (ref 0.44–1.00)
GFR calc Af Amer: >60 mL/min (ref >60)
GFR calc non Af Amer: 55 mL/min — ABNORMAL LOW (ref >60)
Glucose, Bld: 193 mg/dL — ABNORMAL HIGH (ref 65–99)
POTASSIUM: 4.1 mmol/L (ref 3.5–5.1)
Sodium: 137 mmol/L (ref 135–145)

## 2017-05-24 MED ORDER — HYDRALAZINE HCL 20 MG/ML IJ SOLN
10.0000 mg | INTRAMUSCULAR | Status: DC | PRN
  Administered 2017-05-26: 10 mg via INTRAVENOUS
  Filled 2017-05-24: qty 1

## 2017-05-24 MED ORDER — FUROSEMIDE 10 MG/ML IJ SOLN
40.0000 mg | Freq: Two times a day (BID) | INTRAMUSCULAR | Status: DC
  Administered 2017-05-24 (×2): 40 mg via INTRAVENOUS
  Filled 2017-05-24 (×2): qty 4

## 2017-05-24 MED ORDER — HYDRALAZINE HCL 20 MG/ML IJ SOLN
10.0000 mg | INTRAMUSCULAR | Status: DC | PRN
  Administered 2017-05-24: 10 mg via INTRAVENOUS
  Filled 2017-05-24: qty 1

## 2017-05-24 NOTE — Progress Notes (Signed)
Pt did not complain of pain or SOB, worked with PT and ambulated in a hallway. Robitussin once given for cough, sitting in a recliner most of the time, will continue to monitor the patient.

## 2017-05-24 NOTE — Care Management Note (Signed)
Case Management Note  Patient Details  Name: [name] MRN: [id number] Date of Birth: Nov 17, 1931  Subjective/Objective:  From home with family support, son at bedside.  Patient chose [facility] for HHPT, referral made to [facility] with [facility]. Soc will begin 24-48 hrs post dc.  Also NCM gave patient  $10 coupon for spiriva and asked MD about eliquis, attending MD will need to speak with patient cardiologist tomorrow about patient's anticoagulation regimen.  Patient states eliquis is too high for her to afford.  Her insurance is [employer]  PCP is   Billey Gosling                Action/Plan: NCM will follow for dc needs.   Expected Discharge Date:                  Expected Discharge Plan:  [place]  In-House Referral:     Discharge planning Services  CM Consult, Medication Assistance  Post Acute Care Choice:  Home Health Choice offered to:  Patient  DME Arranged:    DME Agency:     HH Arranged:  PT [place]:  [place]  Status of Service:  Completed, signed off  If discussed at [place] of Stay Meetings, dates discussed:    Additional Comments:  Zenon Mayo, RN 05/24/2017, 5:10 PM

## 2017-05-24 NOTE — Progress Notes (Signed)
PROGRESS NOTE    [name]  ZJI:[id number] DOB: Sep 25, 1931 DOA: 05/21/2017 PCP: [name], MD   Brief Narrative: 83 y.o. female with medical history significant of HTN, HLD, A. fib, CAD, COPD, DM type II; who presents with complaints of shortness of breath over last 2 days. Symptoms initially started with cough for the last 1 week.  Patient was hypoxic in the ER requiring oxygen. Treated with Solu-Medrol and azithromycin. Admitted for further evaluation.  Assessment & Plan:  # Acute COPD exacerbation with chronic bronchitis changes and acute respiratory failure with hypoxia: -Patient is still with cough, mild shortness of breath and wheezing. Repeat x-ray yesterday with no acute finding. I switched to IV Lasix today. Patient received a dose of IV Lasix yesterday.  -Planned to continue Solu-Medrol, azithromycin and bronchodilators. Try to taper down oxygen gradually. -PT OT therapy. -Repeat BMP in the morning. -Mucinex DM when necessary.  #Atrial fibrillation/flutter, exact type unknown on chronic anticoagulation: Continue Coreg,eliquis. Monitor heart rate  #Possible Acute on Chronic diastolic congestive heart failure:  Had a mildly elevated BNP on admission. Chest x-ray with no edema. Given bilateral lung crackles and coughing, Switched to IV Lasix for today and possibly conversion to by mouth tomorrow.  #Essential hypertension : Monitor blood pressure. Continue current antihypertensive medication.  #Hyperthyroidism: Continue methimazole  #Type 2 diabetes with hyperglycemia: Blood sugar level elevated because of steroid use. I increased the dose of NPH insulin to 50 units. Continue sliding scale. Monitor blood sugar level.   #Coronary artery disease: Continue Coreg, Lasix, eliquis. Patient has no chest pain.  #Polycythemia: Likely in the setting of COPD. Continue supportive care  #OSA on CPAP: Continue CPAP  #Had a fall yesterday: CT scan of head with no acute  finding. Continue supportive care.  PT OT evaluation.   Principal Problem:   COPD exacerbation ([place]) Active Problems:   ATRIAL FIBRILLATION   Hyperthyroidism   Chronic diastolic heart failure (HCC)   OSA on CPAP   Diabetes ([place])  DVT prophylaxis:eliquis Code Status:full code Family Communication: No family at bedside Disposition Plan: Likely discharge home in 1-2 days    Consultants:   None  Procedures: None Antimicrobials: Azithromycin  Subjective: Seen and examined at bedside. Patient reported cough, shortness of breath and wheezing. Denied headache, dizziness, chest pain, headache or dizziness.   Objective: Vitals:   05/24/17 0231 05/24/17 0353 05/24/17 0516 05/24/17 0832  BP: (!) 162/69  (!) 147/62 (!) 154/70  Pulse:   76 92  Resp:   18   Temp:   97.4 F (36.3 C)   TempSrc:   Oral   SpO2:   94%   Weight:  79.4 kg (175 lb)    Height:        Intake/Output Summary (Last 24 hours) at 05/24/17 1047 Last data filed at 05/24/17 0900  Gross per 24 hour  Intake             1140 ml  Output             2200 ml  Net            -1060 ml   Filed Weights   05/22/17 0105 05/23/17 0624 05/24/17 0353  Weight: 79.6 kg (175 lb 8 oz) 78.6 kg (173 lb 3.2 oz) 79.4 kg (175 lb)    Examination:  General examElderly female sitting on chair, coughing Respiratory  systemBibasal crackles with diffuse mild wheeze. Respiratory effort normal. Cardiovascular system: Regular rate and rhythm, S1 and  S2 normal. No pedal edema.   Gastrointestinal system: Abdomen is nondistended, soft and nontender. Normal bowel sounds heard. Central nervous systemAlert, awake, following commands. Extremities : Symmetric 5 x 5 power. Skin: No rashes, lesions or ulcers Psychiatry: Judgement and insight appear normal. Mood & affect appropriate.     Data Reviewed: I have personally reviewed following labs and imaging studies  CBC:  Recent Labs Lab 05/21/17 1934 05/22/17 0446  WBC 5.8 6.0    HGB 16.3* 15.0  HCT 51.9* 48.1*  MCV 94.0 93.9  PLT 167 364   Basic Metabolic Panel:  Recent Labs Lab 05/21/17 1934 05/22/17 0446 05/24/17 0619  NA 136 139 137  K 4.2 4.4 4.1  CL 100* 103 99*  CO2 27 28 26   GLUCOSE 102* 264* 193*  BUN 19 23* 37*  CREATININE 0.90 0.97 0.94  CALCIUM 9.5 9.3 9.4   GFR: Estimated Creatinine Clearance: 44.2 mL/min (by C-G formula based on SCr of 0.94 mg/dL). Liver Function Tests: No results for input(s): AST, ALT, ALKPHOS, BILITOT, PROT, ALBUMIN in the last 168 hours. No results for input(s): LIPASE, AMYLASE in the last 168 hours. No results for input(s): AMMONIA in the last 168 hours. Coagulation Profile: No results for input(s): INR, PROTIME in the last 168 hours. Cardiac Enzymes: No results for input(s): CKTOTAL, CKMB, CKMBINDEX, TROPONINI in the last 168 hours. BNP (last 3 results) No results for input(s): PROBNP in the last 8760 hours. HbA1C: No results for input(s): HGBA1C in the last 72 hours. CBG:  Recent Labs Lab 05/23/17 0804 05/23/17 1118 05/23/17 1613 05/23/17 2149 05/24/17 0811  GLUCAP 231* 319* 181* 194* 204*   Lipid Profile: No results for input(s): CHOL, HDL, LDLCALC, TRIG, CHOLHDL, LDLDIRECT in the last 72 hours. Thyroid Function Tests:  Recent Labs  05/22/17 0736  T3FREE 2.3   Anemia Panel: No results for input(s): VITAMINB12, FOLATE, FERRITIN, TIBC, IRON, RETICCTPCT in the last 72 hours. Sepsis Labs: No results for input(s): PROCALCITON, LATICACIDVEN in the last 168 hours.  No results found for this or any previous visit (from the past 240 hour(s)).       Radiology Studies: Dg Chest 2 View  Result Date: 05/23/2017 CLINICAL DATA:  Productive cough and shortness of breath for 1 week EXAM: CHEST  2 VIEW COMPARISON:  05/21/2017 FINDINGS: Cardiac shadow is within normal limits. Aortic calcifications are again noted. Lungs are well aerated bilaterally. Mild elevation the right hemidiaphragm is again seen.  No acute bony abnormality is seen. IMPRESSION: No acute abnormality noted. Electronically Signed   By: Inez Catalina M.D.   On: 05/23/2017 11:09   Ct Head Wo Contrast  Result Date: 05/24/2017 CLINICAL DATA:  Found on floor, slipped from chair. History of hypertension, atrial fibrillation. EXAM: CT HEAD WITHOUT CONTRAST TECHNIQUE: Contiguous axial images were obtained from the base of the skull through the vertex without intravenous contrast. COMPARISON:  CT HEAD May 30, 2015 FINDINGS: BRAIN: No intraparenchymal hemorrhage, mass effect nor midline shift. The ventricles and sulci are normal for age. Patchy supratentorial white matter hypodensities less than expected for patient's age, though non-specific are most compatible with chronic small vessel ischemic disease. No acute large vascular territory infarcts. No abnormal extra-axial fluid collections. Basal cisterns are patent. VASCULAR: Moderate calcific atherosclerosis of the carotid siphons. SKULL: No skull fracture. No significant scalp soft tissue swelling. SINUSES/ORBITS: Mild paranasal sinus mucosal thickening. Mastoid air cells are well aerated.The included ocular globes and orbital contents are non-suspicious. Status post bilateral ocular lens implants. OTHER:  None. IMPRESSION: Negative noncontrast CT HEAD for age. Electronically Signed   By: Elon Alas M.D.   On: 05/24/2017 03:44        Scheduled Meds: . apixaban  5 mg Oral BID  . azithromycin  500 mg Oral Q24H  . carvedilol  12.5 mg Oral BID WC  . furosemide  40 mg Intravenous Q12H  . gabapentin  300 mg Oral QHS  . insulin aspart  0-15 Units Subcutaneous TID WC  . insulin aspart  0-5 Units Subcutaneous QHS  . insulin NPH Human  50 Units Subcutaneous QAC breakfast  . ipratropium-albuterol  3 mL Nebulization TID  . methimazole  5 mg Oral Once per day on Mon Fri  . methylPREDNISolone (SOLU-MEDROL) injection  60 mg Intravenous Q12H  . potassium chloride SA  20 mEq Oral Daily    Continuous Infusions:    LOS: 3 days    Dron Tanna Furry, MD [facility] Pager [phone]  If 7PM-7AM, please contact night-coverage [email or web address] Password [facility] 05/24/2017, 10:47 AM

## 2017-05-24 NOTE — Progress Notes (Signed)
SATURATION QUALIFICATIONS: (This note is used to comply with regulatory documentation for home oxygen)  Patient Saturations on Room Air at Rest = 90%  Patient Saturations on Room Air while Ambulating = 83%  Patient Saturations on 2 Liters of oxygen while Ambulating = 87%  Please briefly explain why patient needs home oxygen: pt unable to maintain sats >90% with mobility  Earney Navy, PTA Pager: [phone]

## 2017-05-24 NOTE — Progress Notes (Signed)
Patient back from CT and wheezing, giving nebulizer. HR 93.

## 2017-05-24 NOTE — Progress Notes (Signed)
Pharmacy fixed note for BP med. SBP >160 (not SPB <160).

## 2017-05-24 NOTE — Progress Notes (Signed)
Placed pt on Cpap with 2 lpm bleed in oxygen tolerating well

## 2017-05-24 NOTE — Telephone Encounter (Signed)
Patient called to advise that she is currently admitted. I have cancelled the appt for tomorrow and patient requests that you take a look at her current readings.

## 2017-05-24 NOTE — Progress Notes (Signed)
Verbal order received from Dr. Hal Hope- stat Ct of head.

## 2017-05-24 NOTE — Progress Notes (Signed)
Patient still complaining of headache that started after her fall. Gave tylenol, still rates headache as a 6. Told her I would inform MD, patient then denied headache only because she didn't want a scan done. MD notified.

## 2017-05-24 NOTE — Progress Notes (Signed)
Pt assessed this am, no complain of headache, is on oxygen @2l /min via nasal canula,  pt is willing to be discharged today.

## 2017-05-24 NOTE — Progress Notes (Signed)
Pt complain of a headache but she says it is tolerable and refused to tale tylenol this time, will continue to monitor

## 2017-05-24 NOTE — Progress Notes (Addendum)
Physical Therapy Treatment Patient Details Name: [name] MRN: [id number] DOB: [birth year] Today's Date: 05/24/2017    History of Present Illness Pt is an 83 yo female admitted through ED with COPD exacerbation. Pt went to urgent care due to severe dyspnea and O2 sats were found to be 87%. Pt was diagnosed with COPD exacerbation. PMH significant for HTN, HLD, A-fib, CAD, COPD, hyperthyroid, DM and CHF.     PT Comments    Patient able to tolerate increased gait distance with one seated rest break. SpO2 <90% on 2L O2 via [place] with ambulation. See sat qualifications note. Pt may benefit from incentive spirometer--being ordered on unit. Continue to progress as tolerated with anticipated d/c home with HHPT.   Follow Up Recommendations  Home health PT     Equipment Recommendations  None recommended by PT    Recommendations for Other Services       Precautions / Restrictions Precautions Precautions: Fall Restrictions Weight Bearing Restrictions: No    Mobility  Bed Mobility               General bed mobility comments: pt sitting in chair upon arrival  Transfers Overall transfer level: Needs assistance Equipment used: Straight cane Transfers: Sit to/from Stand Sit to Stand: Supervision         General transfer comment: supervision for safety  Ambulation/Gait Ambulation/Gait assistance: Supervision Ambulation Distance (Feet): 200 Feet Assistive device: Straight cane Gait Pattern/deviations: Step-through pattern;Decreased stride length;Trunk flexed Gait velocity: decreased   General Gait Details: seated rest break required due to c/o increased in L hip with ambulation; SpO2 down to 87% on 2L O2 via [place] with ambulation; cues for pursed lip breathing and SpO2 up to 91% with rest   Stairs            Wheelchair Mobility    Modified Rankin (Stroke Patients Only)       Balance Overall balance assessment: Needs assistance Sitting-balance support: No  upper extremity supported;Feet supported Sitting balance-Leahy Scale: Good     Standing balance support: Single extremity supported;During functional activity Standing balance-Leahy Scale: Fair                              Cognition Arousal/Alertness: Awake/alert Behavior During Therapy: WFL for tasks assessed/performed Overall Cognitive Status: Within Functional Limits for tasks assessed                                        Exercises      General Comments        Pertinent Vitals/Pain Pain Assessment: Faces Faces Pain Scale: Hurts little more Pain Location: L hip with mobility Pain Descriptors / Indicators: Sore Pain Intervention(s): Limited activity within patient's tolerance;Monitored during session;Repositioned    Home Living                      Prior Function            PT Goals (current goals can now be found in the care plan section) Acute Rehab PT Goals Patient Stated Goal: to get back to normal PT Goal Formulation: With patient Time For Goal Achievement: 05/29/17 Potential to Achieve Goals: Good Progress towards PT goals: Progressing toward goals    Frequency    Min 3X/week      PT Plan Current plan remains appropriate  Co-evaluation              AM-PAC PT "6 Clicks" Daily Activity  Outcome Measure  Difficulty turning over in bed (including adjusting bedclothes, sheets and blankets)?: None Difficulty moving from lying on back to sitting on the side of the bed? : None Difficulty sitting down on and standing up from a chair with arms (e.g., wheelchair, bedside commode, etc,.)?: A Little Help needed moving to and from a bed to chair (including a wheelchair)?: A Little Help needed walking in hospital room?: A Little Help needed climbing 3-5 steps with a railing? : A Little 6 Click Score: 20    End of Session Equipment Utilized During Treatment: Gait belt Activity Tolerance: Patient tolerated  treatment well Patient left: in chair;with call bell/phone within reach;with chair alarm set Nurse Communication: Mobility status PT Visit Diagnosis: Muscle weakness (generalized) (M62.81);Difficulty in walking, not elsewhere classified (R26.2)     Time: 0623-7628 PT Time Calculation (min) (ACUTE ONLY): 32 min  Charges:  $Gait Training: 23-37 mins                    G Codes:       Earney Navy, PTA Pager: [phone]     Darliss Cheney 05/24/2017, 3:42 PM

## 2017-05-24 NOTE — Progress Notes (Signed)
Inpatient Diabetes Program Recommendations  AACE/ADA: New Consensus Statement on Inpatient Glycemic Control (2015)  Target Ranges:  Prepandial:   less than 140 mg/dL      Peak postprandial:   less than 180 mg/dL (1-2 hours)      Critically ill patients:  140 - 180 mg/dL   Results for [name], [name] (MRN [id number]) as of 05/24/2017 14:20  Ref. Range 05/23/2017 08:04 05/23/2017 11:18 05/23/2017 16:13 05/23/2017 21:49  Glucose-Capillary Latest Ref Range: 65 - 99 mg/dL 231 (H) 319 (H) 181 (H) 194 (H)   Results for [name], [name] (MRN [id number]) as of 05/24/2017 14:20  Ref. Range 05/24/2017 08:11 05/24/2017 11:18  Glucose-Capillary Latest Ref Range: 65 - 99 mg/dL 204 (H) 345 (H)    Home DM Meds: NPH Insulin 40 units daily  Current Insulin Orders: NPH Insulin 50 units daily      Novolog Moderate Correction Scale/ SSI (0-15 units) TID AC + HS       MD- Note NPH Insulin increased today.  Patient still having issues with elevated fasting and postprandial glucose levels.  Please consider the following in-hospital insulin adjustments:  1. Start bedtime dose of NPH Insulin- Recommend 10 units NPH at bedtime  2. Start Novolog Meal Coverage: Novolog 4 units TID with meals (hold if pt eats <50% of meal)      --Will follow patient during hospitalization--  Wyn Quaker RN, MSN, CDE Diabetes Coordinator Inpatient Glycemic Control Team Team Pager: [phone] (8a-5p)

## 2017-05-24 NOTE — Progress Notes (Signed)
Notified MD about patients elevated BP, verbal orders received.

## 2017-05-25 ENCOUNTER — Ambulatory Visit: Payer: Medicare Other | Admitting: Family Medicine

## 2017-05-25 ENCOUNTER — Inpatient Hospital Stay (HOSPITAL_COMMUNITY): Payer: Medicare Other

## 2017-05-25 ENCOUNTER — Ambulatory Visit: Payer: Medicare Other | Admitting: Endocrinology

## 2017-05-25 DIAGNOSIS — R0602 Shortness of breath: Secondary | ICD-10-CM

## 2017-05-25 LAB — BASIC METABOLIC PANEL
ANION GAP: 9 (ref 5–15)
BUN: 36 mg/dL — AB (ref 6–20)
CALCIUM: 9.3 mg/dL (ref 8.9–10.3)
CO2: 33 mmol/L — AB (ref 22–32)
Chloride: 100 mmol/L — ABNORMAL LOW (ref 101–111)
Creatinine, Ser: 1.04 mg/dL — ABNORMAL HIGH (ref 0.44–1.00)
GFR calc Af Amer: 56 mL/min — ABNORMAL LOW (ref >60)
GFR, EST NON AFRICAN AMERICAN: 48 mL/min — AB (ref >60)
GLUCOSE: 143 mg/dL — AB (ref 65–99)
POTASSIUM: 4.5 mmol/L (ref 3.5–5.1)
Sodium: 142 mmol/L (ref 135–145)

## 2017-05-25 LAB — GLUCOSE, CAPILLARY
GLUCOSE-CAPILLARY: 283 mg/dL — AB (ref 65–99)
GLUCOSE-CAPILLARY: 323 mg/dL — AB (ref 65–99)
Glucose-Capillary: 152 mg/dL — ABNORMAL HIGH (ref 65–99)
Glucose-Capillary: 208 mg/dL — ABNORMAL HIGH (ref 65–99)

## 2017-05-25 MED ORDER — FUROSEMIDE 80 MG PO TABS
80.0000 mg | ORAL_TABLET | Freq: Every day | ORAL | Status: DC
  Administered 2017-05-25 – 2017-05-27 (×3): 80 mg via ORAL
  Filled 2017-05-25 (×3): qty 1

## 2017-05-25 MED ORDER — DIPHENHYDRAMINE HCL 25 MG PO CAPS: 25.0000 mg | ORAL_CAPSULE | Freq: Three times a day (TID) | ORAL | Status: DC | PRN

## 2017-05-25 MED ORDER — DEXTROSE 5 % IV SOLN
1.0000 g | INTRAVENOUS | Status: DC
  Administered 2017-05-25 – 2017-05-26 (×2): 1 g via INTRAVENOUS
  Filled 2017-05-25 (×3): qty 10

## 2017-05-25 MED ORDER — GUAIFENESIN ER 600 MG PO TB12
600.0000 mg | ORAL_TABLET | Freq: Two times a day (BID) | ORAL | Status: DC
  Administered 2017-05-25 – 2017-05-27 (×5): 600 mg via ORAL
  Filled 2017-05-25 (×5): qty 1

## 2017-05-25 NOTE — Progress Notes (Signed)
PROGRESS NOTE    [name]  GYJ:[id number] DOB: [birth year] DOA: 05/21/2017 PCP: [name], MD   Brief Narrative: 83 y.o. female with medical history significant of HTN, HLD, A. fib, CAD, COPD, DM type II; who presents with complaints of shortness of breath over last 2 days. Symptoms initially started with cough for the last 1 week.  Patient was hypoxic in the ER requiring oxygen. Treated with Solu-Medrol and azithromycin. Admitted for further evaluation.  Assessment & Plan:  # Acute COPD exacerbation with chronic bronchitis changes and acute respiratory failure with hypoxia: -Patient is still coughing, shortness of breath and has decreased breath sound on the lungs bases. No significant clinical improvement. Discussed with the patient in detail. Getting CT chest, added Mucinex. -Switch to oral Lasix today. Patient does not look fluid overloaded on exam. -Continue Solu-Medrol, azithromycin and bronchodilators. Try to taper down oxygen gradually. -PT OT therapy. -Mucinex DM when necessary.  #Atrial fibrillation/flutter, exact type unknown on chronic anticoagulation: Continue Coreg,eliquis. Monitor heart rate. -I discussed with the case manager and patient. Patient reported that she has Eliquis at home for 3 weeks supply. She will follow up with her cardiologist outpatient to discuss about other alternatives.  #Possible Acute on Chronic diastolic congestive heart failure:  Treated with IV Lasix. Changing to oral today.  #Essential hypertension : Monitor blood pressure. Continue current antihypertensive medication.  #Hyperthyroidism: Continue methimazole  #Type 2 diabetes with hyperglycemia: Blood sugar level elevated because of steroid use. I increased the dose of NPH insulin to 50 units. Continue sliding scale. Monitor blood sugar level.   #Coronary artery disease: Continue Coreg, Lasix, eliquis. Patient has no chest pain.  #Polycythemia: Likely in the setting of COPD.  Continue supportive care  #OSA on CPAP: Continue CPAP  #Had a fall on 5/28: CT scan of head with no acute finding. Continue supportive care.  PT OT evaluation.   Principal Problem:   COPD exacerbation ([place]) Active Problems:   ATRIAL FIBRILLATION   Hyperthyroidism   Chronic diastolic heart failure (HCC)   OSA on CPAP   Diabetes ([place])  DVT prophylaxis:eliquis Code Status:full code Family Communication: No family at bedside Disposition Plan: Likely discharge home in 1-2 days    Consultants:   None  Procedures: None Antimicrobials: Azithromycin  Subjective: Seen and examined at bedside. Still having shortness of breath or cough. Requiring oxygen. Denied chest pain, headache, dizziness, nausea or vomiting.   Objective: Vitals:   05/24/17 2048 05/24/17 2056 05/24/17 2331 05/25/17 0353  BP: (!) 152/80   (!) 164/74  Pulse: 73   82  Resp: 18   20  Temp: 97.7 F (36.5 C)   97.6 F (36.4 C)  TempSrc: Oral   Oral  SpO2: 95% 97% 98% 92%  Weight:    78.4 kg (172 lb 14.4 oz)  Height:        Intake/Output Summary (Last 24 hours) at 05/25/17 1008 Last data filed at 05/25/17 1007  Gross per 24 hour  Intake              720 ml  Output             1600 ml  Net             -880 ml   Filed Weights   05/23/17 0624 05/24/17 0353 05/25/17 0353  Weight: 78.6 kg (173 lb 3.2 oz) 79.4 kg (175 lb) 78.4 kg (172 lb 14.4 oz)    Examination:  General exam: Elderly female  sitting on bed with increased coughing Respiratory  System: Bibasal crackles with decreased breath sound, Cardiovascular system: Regular rate rhythm, S1-S2 normal..   Gastrointestinal system: Abdomen is nondistended, soft and nontender. Normal bowel sounds heard. Central nervous alert awake and following commands  Extremities : Symmetric 5 x 5 power. No lower extremity edema. Skin: No rashes, lesions or ulcers. Psychiatry: Judgement and insight appear normal. Mood & affect appropriate.     Data Reviewed: I  have personally reviewed following labs and imaging studies  CBC:  Recent Labs Lab 05/21/17 1934 05/22/17 0446  WBC 5.8 6.0  HGB 16.3* 15.0  HCT 51.9* 48.1*  MCV 94.0 93.9  PLT 167 086   Basic Metabolic Panel:  Recent Labs Lab 05/21/17 1934 05/22/17 0446 05/24/17 0619 05/25/17 0309  NA 136 139 137 142  K 4.2 4.4 4.1 4.5  CL 100* 103 99* 100*  CO2 27 28 26  33*  GLUCOSE 102* 264* 193* 143*  BUN 19 23* 37* 36*  CREATININE 0.90 0.97 0.94 1.04*  CALCIUM 9.5 9.3 9.4 9.3   GFR: Estimated Creatinine Clearance: 39.7 mL/min (A) (by C-G formula based on SCr of 1.04 mg/dL (H)). Liver Function Tests: No results for input(s): AST, ALT, ALKPHOS, BILITOT, PROT, ALBUMIN in the last 168 hours. No results for input(s): LIPASE, AMYLASE in the last 168 hours. No results for input(s): AMMONIA in the last 168 hours. Coagulation Profile: No results for input(s): INR, PROTIME in the last 168 hours. Cardiac Enzymes: No results for input(s): CKTOTAL, CKMB, CKMBINDEX, TROPONINI in the last 168 hours. BNP (last 3 results) No results for input(s): PROBNP in the last 8760 hours. HbA1C: No results for input(s): HGBA1C in the last 72 hours. CBG:  Recent Labs Lab 05/24/17 0811 05/24/17 1118 05/24/17 1621 05/24/17 2207 05/25/17 0755  GLUCAP 204* 345* 251* 156* 152*   Lipid Profile: No results for input(s): CHOL, HDL, LDLCALC, TRIG, CHOLHDL, LDLDIRECT in the last 72 hours. Thyroid Function Tests: No results for input(s): TSH, T4TOTAL, FREET4, T3FREE, THYROIDAB in the last 72 hours. Anemia Panel: No results for input(s): VITAMINB12, FOLATE, FERRITIN, TIBC, IRON, RETICCTPCT in the last 72 hours. Sepsis Labs: No results for input(s): PROCALCITON, LATICACIDVEN in the last 168 hours.  No results found for this or any previous visit (from the past 240 hour(s)).       Radiology Studies: Dg Chest 2 View  Result Date: 05/23/2017 CLINICAL DATA:  Productive cough and shortness of breath  for 1 week EXAM: CHEST  2 VIEW COMPARISON:  05/21/2017 FINDINGS: Cardiac shadow is within normal limits. Aortic calcifications are again noted. Lungs are well aerated bilaterally. Mild elevation the right hemidiaphragm is again seen. No acute bony abnormality is seen. IMPRESSION: No acute abnormality noted. Electronically Signed   By: Inez Catalina M.D.   On: 05/23/2017 11:09   Ct Head Wo Contrast  Result Date: 05/24/2017 CLINICAL DATA:  Found on floor, slipped from chair. History of hypertension, atrial fibrillation. EXAM: CT HEAD WITHOUT CONTRAST TECHNIQUE: Contiguous axial images were obtained from the base of the skull through the vertex without intravenous contrast. COMPARISON:  CT HEAD May 30, 2015 FINDINGS: BRAIN: No intraparenchymal hemorrhage, mass effect nor midline shift. The ventricles and sulci are normal for age. Patchy supratentorial white matter hypodensities less than expected for patient's age, though non-specific are most compatible with chronic small vessel ischemic disease. No acute large vascular territory infarcts. No abnormal extra-axial fluid collections. Basal cisterns are patent. VASCULAR: Moderate calcific atherosclerosis of the carotid siphons.  SKULL: No skull fracture. No significant scalp soft tissue swelling. SINUSES/ORBITS: Mild paranasal sinus mucosal thickening. Mastoid air cells are well aerated.The included ocular globes and orbital contents are non-suspicious. Status post bilateral ocular lens implants. OTHER: None. IMPRESSION: Negative noncontrast CT HEAD for age. Electronically Signed   By: Elon Alas M.D.   On: 05/24/2017 03:44        Scheduled Meds: . apixaban  5 mg Oral BID  . azithromycin  500 mg Oral Q24H  . carvedilol  12.5 mg Oral BID WC  . furosemide  80 mg Oral Daily  . gabapentin  300 mg Oral QHS  . guaiFENesin  600 mg Oral BID  . insulin aspart  0-15 Units Subcutaneous TID WC  . insulin aspart  0-5 Units Subcutaneous QHS  . insulin NPH  Human  50 Units Subcutaneous QAC breakfast  . ipratropium-albuterol  3 mL Nebulization TID  . methimazole  5 mg Oral Once per day on Mon Fri  . methylPREDNISolone (SOLU-MEDROL) injection  60 mg Intravenous Q12H  . potassium chloride SA  20 mEq Oral Daily   Continuous Infusions:    LOS: 4 days    Amedee Cerrone Tanna Furry, MD [facility] Pager [phone]  If 7PM-7AM, please contact night-coverage [email or web address] Password [email or web address] 05/25/2017, 10:08 AM

## 2017-05-25 NOTE — Progress Notes (Signed)
Pt. placed on CPAP per request @ this time with FFM, tolerating well, oxygen placed into circuit, RT to monitor.

## 2017-05-25 NOTE — Progress Notes (Signed)
Physical Therapy Treatment Patient Details Name: [name] MRN: [id number] DOB: 10-Nov-1931 Today's Date: 05/25/2017    History of Present Illness Pt is an 83 yo female admitted through ED with COPD exacerbation. Pt went to urgent care due to severe dyspnea and O2 sats were found to be 87%. Pt was diagnosed with COPD exacerbation. PMH significant for HTN, HLD, A-fib, CAD, COPD, hyperthyroid, DM and CHF.     PT Comments    Patient required more rest breaks this session due to 2/4 DOE. SpO2 88-90% on 2L O2 via [place] with ambulation. Pt unsteady with high level balance activities but with no LOB. Pt will continue to benefit from further skilled PT services in acute setting and upon d/c.    Follow Up Recommendations  Home health PT     Equipment Recommendations  None recommended by PT    Recommendations for Other Services       Precautions / Restrictions Precautions Precautions: Fall Restrictions Weight Bearing Restrictions: No    Mobility  Bed Mobility               General bed mobility comments: pt sitting in chair upon arrival  Transfers Overall transfer level: Needs assistance Equipment used: Straight cane Transfers: Sit to/from Stand Sit to Stand: Supervision         General transfer comment: supervision for safety  Ambulation/Gait Ambulation/Gait assistance: Supervision;Min guard Ambulation Distance (Feet): 200 Feet Assistive device: Straight cane Gait Pattern/deviations: Step-through pattern;Decreased stride length;Trunk flexed Gait velocity: decreased   General Gait Details: 2 standing rest breaks and one seated rest break required; pt unsteady with horizontal head turns and directional changes requiring min guard for safety; SpO2 down to 88% on 2L O2 via [place] with ambulation and up to 90% after rest and pursed lip breathing   Stairs            Wheelchair Mobility    Modified Rankin (Stroke Patients Only)       Balance Overall balance  assessment: Needs assistance Sitting-balance support: No upper extremity supported;Feet supported Sitting balance-Leahy Scale: Good     Standing balance support: Single extremity supported;During functional activity Standing balance-Leahy Scale: Fair                              Cognition Arousal/Alertness: Awake/alert Behavior During Therapy: WFL for tasks assessed/performed Overall Cognitive Status: Within Functional Limits for tasks assessed                                        Exercises      General Comments General comments (skin integrity, edema, etc.): 2/4 DOE with ambulation      Pertinent Vitals/Pain Pain Assessment: No/denies pain    Home Living                      Prior Function            PT Goals (current goals can now be found in the care plan section) Acute Rehab PT Goals Patient Stated Goal: to get back to normal PT Goal Formulation: With patient Time For Goal Achievement: 05/29/17 Potential to Achieve Goals: Good Progress towards PT goals: Progressing toward goals    Frequency    Min 3X/week      PT Plan Current plan remains appropriate    Co-evaluation  AM-PAC PT "6 Clicks" Daily Activity  Outcome Measure  Difficulty turning over in bed (including adjusting bedclothes, sheets and blankets)?: None Difficulty moving from lying on back to sitting on the side of the bed? : None Difficulty sitting down on and standing up from a chair with arms (e.g., wheelchair, bedside commode, etc,.)?: A Little Help needed moving to and from a bed to chair (including a wheelchair)?: A Little Help needed walking in hospital room?: A Little Help needed climbing 3-5 steps with a railing? : A Little 6 Click Score: 20    End of Session Equipment Utilized During Treatment: Gait belt Activity Tolerance: Patient tolerated treatment well Patient left: in chair;with call bell/phone within reach;with chair  alarm set Nurse Communication: Mobility status PT Visit Diagnosis: Muscle weakness (generalized) (M62.81);Difficulty in walking, not elsewhere classified (R26.2)     Time: 6659-9357 PT Time Calculation (min) (ACUTE ONLY): 35 min  Charges:  $Gait Training: 23-37 mins                    G Codes:       Earney Navy, PTA Pager: [phone]     Darliss Cheney 05/25/2017, 10:19 AM

## 2017-05-25 NOTE — Care Management Important Message (Signed)
Important Message  Patient Details  Name: [name] MRN: [id number] Date of Birth: Nov 29, 1931   Medicare Important Message Given:  Yes    Orbie Pyo 05/25/2017, 2:48 PM

## 2017-05-26 LAB — CBC
HEMATOCRIT: 51.7 % — AB (ref 36.0–46.0)
Hemoglobin: 16.9 g/dL — ABNORMAL HIGH (ref 12.0–15.0)
MCH: 30 pg (ref 26.0–34.0)
MCHC: 32.7 g/dL (ref 30.0–36.0)
MCV: 91.8 fL (ref 78.0–100.0)
Platelets: 162 10*3/uL (ref 150–400)
RBC: 5.63 MIL/uL — ABNORMAL HIGH (ref 3.87–5.11)
RDW: 14.1 % (ref 11.5–15.5)
WBC: 14.8 10*3/uL — ABNORMAL HIGH (ref 4.0–10.5)

## 2017-05-26 LAB — GLUCOSE, CAPILLARY
GLUCOSE-CAPILLARY: 224 mg/dL — AB (ref 65–99)
Glucose-Capillary: 141 mg/dL — ABNORMAL HIGH (ref 65–99)
Glucose-Capillary: 63 mg/dL — ABNORMAL LOW (ref 65–99)
Glucose-Capillary: 237 mg/dL — ABNORMAL HIGH (ref 65–99)
Glucose-Capillary: 370 mg/dL — ABNORMAL HIGH (ref 65–99)

## 2017-05-26 LAB — EXPECTORATED SPUTUM ASSESSMENT W GRAM STAIN, RFLX TO RESP C

## 2017-05-26 LAB — BASIC METABOLIC PANEL
Anion gap: 13 (ref 5–15)
BUN: 33 mg/dL — ABNORMAL HIGH (ref 6–20)
CALCIUM: 9.3 mg/dL (ref 8.9–10.3)
CO2: 27 mmol/L (ref 22–32)
CREATININE: 1.11 mg/dL — AB (ref 0.44–1.00)
Chloride: 97 mmol/L — ABNORMAL LOW (ref 101–111)
GFR calc Af Amer: 52 mL/min — ABNORMAL LOW (ref >60)
GFR calc non Af Amer: 45 mL/min — ABNORMAL LOW (ref >60)
GLUCOSE: 229 mg/dL — AB (ref 65–99)
Potassium: 4.2 mmol/L (ref 3.5–5.1)
Sodium: 137 mmol/L (ref 135–145)

## 2017-05-26 LAB — BRAIN NATRIURETIC PEPTIDE: B Natriuretic Peptide: 163.1 pg/mL — ABNORMAL HIGH (ref 0.0–100.0)

## 2017-05-26 LAB — EXPECTORATED SPUTUM ASSESSMENT W REFEX TO RESP CULTURE

## 2017-05-26 MED ORDER — INSULIN NPH (HUMAN) (ISOPHANE) 100 UNIT/ML ~~LOC~~ SUSP
40.0000 [IU] | Freq: Every day | SUBCUTANEOUS | Status: DC
  Administered 2017-05-26 – 2017-05-27 (×2): 40 [IU] via SUBCUTANEOUS
  Filled 2017-05-26: qty 10

## 2017-05-26 NOTE — Progress Notes (Addendum)
PROGRESS NOTE    [name]  QHU:[id number] DOB: Jun 23, 1931 DOA: 05/21/2017 PCP: [name], MD   Brief Narrative: 83 y.o. female with medical history significant of HTN, HLD, A. fib, CAD, COPD, DM type II; who presents with complaints of shortness of breath over last 2 days. Symptoms initially started with cough for the last 1 week.  Patient was hypoxic in the ER requiring oxygen. Treated with Solu-Medrol and azithromycin. Admitted for further evaluation.  Assessment & Plan:  # Acute COPD exacerbation with chronic bronchitis changes and acute respiratory failure with hypoxia: -Patient with no significant clinical improvement. CT scan of the chest consistent with multifocal pneumonia. Started ceftriaxone on 5/30 and plan to continue azithromycin.  -Continue Solu-Medrol, Mucinex. I will add incentive spirometer. -PT OT therapy. -Requiring about 2 L of oxygen. Try to wean down slowly.  #Multifocal pneumonia: Continue antibiotics and treatment as discussed above. Order sputum culture. Check labs including CBC and BMP today.  #Atrial fibrillation/flutter, exact type unknown on chronic anticoagulation: Continue Coreg,eliquis. Monitor heart rate. -I discussed with the case manager and patient. Patient reported that she has Eliquis at home for 3 weeks supply. She will follow up with her cardiologist outpatient to discuss about other alternatives.  #Possible Acute on Chronic diastolic congestive heart failure:  Treated with IV Lasix and changed to oral lasix on 5/30. Patient does not look like fluid overload on exam.  #Essential hypertension : Blood pressure elevated. Continue current medication. On hydralazine as needed. Avoid drop in blood pressure in this elderly patient. Continue to monitor closely.   #Hyperthyroidism: Continue methimazole  #Type 2 diabetes with hyperglycemia likely in the setting of steroid use. Patient had episode of hypoglycemia this morning as patient  reported she did not eat well last night. I lowered the dose of NPH to 40 units. Continue to monitor blood sugar level.  #Coronary artery disease: Continue Coreg, Lasix, eliquis. Patient has no chest pain.  #Polycythemia: Likely in the setting of COPD. Continue supportive care  #OSA on CPAP: Continue CPAP  #Had a fall on 5/28: CT scan of head with no acute finding. Continue supportive care.  PT OT evaluation.   Principal Problem:   COPD exacerbation ([place]) Active Problems:   ATRIAL FIBRILLATION   Hyperthyroidism   Chronic diastolic heart failure (HCC)   OSA on CPAP   Diabetes ([name])  DVT prophylaxis:eliquis Code Status:full code Family Communication: No family at bedside Disposition Plan: Likely discharge home in 1-2 days    Consultants:   None  Procedures: None Antimicrobials: Azithromycin  Ceftriaxone since 5/30  Subjective: Seen and examined at bedside. Reported mild shortness of breath associated with a cough. Not feeling well. Denied headache, nausea, vomiting, chest pain.  Objective: Vitals:   05/26/17 0500 05/26/17 0600 05/26/17 0752 05/26/17 0807  BP: (!) 166/71 (!) 157/64 (!) 174/87   Pulse:   78   Resp:      Temp:      TempSrc:      SpO2:    96%  Weight:      Height:        Intake/Output Summary (Last 24 hours) at 05/26/17 1104 Last data filed at 05/26/17 0900  Gross per 24 hour  Intake              610 ml  Output             1650 ml  Net            -1040  ml   Filed Weights   05/24/17 0353 05/25/17 0353 05/26/17 0359  Weight: 79.4 kg (175 lb) 78.4 kg (172 lb 14.4 oz) 79 kg (174 lb 3.2 oz)    Examination:  General exam: Pleasant elderly female sitting on bed with increased spells of coughing Respiratory  System: Bibasal crackles with decreased breath sound, no wheezing. Cardiovascular system: Regular rate and rhythm, S1 is normal..   Gastrointestinal system: Abdomen soft, nontender, nondistended. Bowel sound positive. Central nervous alert  awake and following commands  Extremities : Symmetric 5 x 5 power. No lower extremity edema. Skin: No rashes, lesions or ulcers. Psychiatry: Judgement and insight appear normal. Mood & affect appropriate.     Data Reviewed: I have personally reviewed following labs and imaging studies  CBC:  Recent Labs Lab 05/21/17 1934 05/22/17 0446  WBC 5.8 6.0  HGB 16.3* 15.0  HCT 51.9* 48.1*  MCV 94.0 93.9  PLT 167 865   Basic Metabolic Panel:  Recent Labs Lab 05/21/17 1934 05/22/17 0446 05/24/17 0619 05/25/17 0309  NA 136 139 137 142  K 4.2 4.4 4.1 4.5  CL 100* 103 99* 100*  CO2 27 28 26  33*  GLUCOSE 102* 264* 193* 143*  BUN 19 23* 37* 36*  CREATININE 0.90 0.97 0.94 1.04*  CALCIUM 9.5 9.3 9.4 9.3   GFR: Estimated Creatinine Clearance: 39.9 mL/min (A) (by C-G formula based on SCr of 1.04 mg/dL (H)). Liver Function Tests: No results for input(s): AST, ALT, ALKPHOS, BILITOT, PROT, ALBUMIN in the last 168 hours. No results for input(s): LIPASE, AMYLASE in the last 168 hours. No results for input(s): AMMONIA in the last 168 hours. Coagulation Profile: No results for input(s): INR, PROTIME in the last 168 hours. Cardiac Enzymes: No results for input(s): CKTOTAL, CKMB, CKMBINDEX, TROPONINI in the last 168 hours. BNP (last 3 results) No results for input(s): PROBNP in the last 8760 hours. HbA1C: No results for input(s): HGBA1C in the last 72 hours. CBG:  Recent Labs Lab 05/25/17 1136 05/25/17 1618 05/25/17 2120 05/26/17 0734 05/26/17 0759  GLUCAP 283* 208* 323* 63* 141*   Lipid Profile: No results for input(s): CHOL, HDL, LDLCALC, TRIG, CHOLHDL, LDLDIRECT in the last 72 hours. Thyroid Function Tests: No results for input(s): TSH, T4TOTAL, FREET4, T3FREE, THYROIDAB in the last 72 hours. Anemia Panel: No results for input(s): VITAMINB12, FOLATE, FERRITIN, TIBC, IRON, RETICCTPCT in the last 72 hours. Sepsis Labs: No results for input(s): PROCALCITON, LATICACIDVEN in  the last 168 hours.  Recent Results (from the past 240 hour(s))  Culture, expectorated sputum-assessment     Status: None   Collection Time: 05/26/17  8:09 AM  Result Value Ref Range Status   Specimen Description EXPECTORATED SPUTUM  Final   Special Requests NONE  Final   Sputum evaluation   Final    Sputum specimen not acceptable for testing.  Please recollect.   Gram Stain Report Called to,Read Back By and Verified With: RN Antionette Poles [phone] 1039AM MLM    Report Status 05/26/2017 FINAL  Final         Radiology Studies: Ct Chest Wo Contrast  Result Date: 05/25/2017 CLINICAL DATA:  Shortness of breath.  Cough for 1 week.  Hypoxia. EXAM: CT CHEST WITHOUT CONTRAST TECHNIQUE: Multidetector CT imaging of the chest was performed following the standard protocol without IV contrast. COMPARISON:  CT chest 05/24/2015. FINDINGS: Cardiovascular: The heart is mildly enlarged. Atherosclerotic calcifications are present in the coronary arteries. Calcifications are also present at the aortic valve and  aortic arch. There is no aneurysm. Pulmonary artery is are of normal size. No significant pericardial effusion is present. Mediastinum/Nodes: No significant mediastinal or axillary adenopathy is present. Lungs/Pleura: Centrilobular emphysema is again noted. There is no significant nodule, mass, ill-defined micro nodularity is present in the posterior right upper lobe. There is subtle peribronchial disease at the lower lobes bilaterally. No significant airspace consolidation is present. The left upper lobe and right middle lobe are clear. Upper Abdomen: A calcification at the dome of the liver or underneath the right hemidiaphragm is stable. No other focal lesions are present within the visualized upper abdomen. Musculoskeletal: A hemangioma at C7 is stable. Fusion of anterior osteophytes the upper thoracic spine are again noted. Vertebral body heights are maintained. No focal lesions are evident otherwise.  IMPRESSION: 1. Ill-defined airspace disease posteriorly in the right upper lobe and lower lobes bilaterally raises concern for infection/multi lobar pneumonia. 2. Emphysema (ICD10-J43.9). Moderate centrilobular emphysema is again seen. 3. Atherosclerosis including coronary artery disease. 4. Stable cardiomegaly without failure. Electronically Signed   By: San Morelle M.D.   On: 05/25/2017 11:05        Scheduled Meds: . apixaban  5 mg Oral BID  . azithromycin  500 mg Oral Q24H  . carvedilol  12.5 mg Oral BID WC  . furosemide  80 mg Oral Daily  . gabapentin  300 mg Oral QHS  . guaiFENesin  600 mg Oral BID  . insulin aspart  0-15 Units Subcutaneous TID WC  . insulin aspart  0-5 Units Subcutaneous QHS  . insulin NPH Human  40 Units Subcutaneous QAC breakfast  . ipratropium-albuterol  3 mL Nebulization TID  . methimazole  5 mg Oral Once per day on Mon Fri  . methylPREDNISolone (SOLU-MEDROL) injection  60 mg Intravenous Q12H  . potassium chloride SA  20 mEq Oral Daily   Continuous Infusions: . cefTRIAXone (ROCEPHIN)  IV Stopped (05/25/17 1606)     LOS: 5 days    Dron Tanna Furry, MD [facility] Pager [phone]  If 7PM-7AM, please contact night-coverage [email or web address] Password [email or web address] 05/26/2017, 11:04 AM

## 2017-05-26 NOTE — Progress Notes (Signed)
Hypoglycemic Event  CBG: 63  Treatment: 15 GM carbohydrate snack  Symptoms: None  Follow-up CBG: NGIT:[id number] CBG Result:141  Possible Reasons for Event: Unknown  Comments/MD notified:Dr. Mohammed Kindle, Darryl Nestle

## 2017-05-27 DIAGNOSIS — I5032 Chronic diastolic (congestive) heart failure: Secondary | ICD-10-CM

## 2017-05-27 DIAGNOSIS — J9621 Acute and chronic respiratory failure with hypoxia: Secondary | ICD-10-CM

## 2017-05-27 DIAGNOSIS — I482 Chronic atrial fibrillation: Secondary | ICD-10-CM

## 2017-05-27 DIAGNOSIS — J189 Pneumonia, unspecified organism: Principal | ICD-10-CM

## 2017-05-27 DIAGNOSIS — J441 Chronic obstructive pulmonary disease with (acute) exacerbation: Secondary | ICD-10-CM

## 2017-05-27 LAB — GLUCOSE, CAPILLARY: GLUCOSE-CAPILLARY: 95 mg/dL (ref 65–99)

## 2017-05-27 MED ORDER — TIOTROPIUM BROMIDE MONOHYDRATE 18 MCG IN CAPS: 18.0000 ug | ORAL_CAPSULE | Freq: Every day | RESPIRATORY_TRACT | 12 refills | Status: DC

## 2017-05-27 MED ORDER — PREDNISONE 10 MG PO TABS: ORAL_TABLET | ORAL | 0 refills | Status: DC

## 2017-05-27 MED ORDER — ALBUTEROL SULFATE HFA 108 (90 BASE) MCG/ACT IN AERS: 2.0000 | INHALATION_SPRAY | Freq: Four times a day (QID) | RESPIRATORY_TRACT | 2 refills | Status: DC | PRN

## 2017-05-27 MED ORDER — AZITHROMYCIN 500 MG PO TABS: 500.0000 mg | ORAL_TABLET | ORAL | 0 refills | Status: DC

## 2017-05-27 MED ORDER — PREDNISONE 20 MG PO TABS: 40.0000 mg | ORAL_TABLET | Freq: Every day | ORAL | Status: DC

## 2017-05-27 MED ORDER — CEFPODOXIME PROXETIL 200 MG PO TABS: 200.0000 mg | ORAL_TABLET | Freq: Two times a day (BID) | ORAL | 0 refills | Status: DC

## 2017-05-27 MED ORDER — TIOTROPIUM BROMIDE MONOHYDRATE 18 MCG IN CAPS
18.0000 ug | ORAL_CAPSULE | Freq: Every day | RESPIRATORY_TRACT | Status: DC
  Filled 2017-05-27: qty 5

## 2017-05-27 MED ORDER — FUROSEMIDE 40 MG PO TABS: 80.0000 mg | ORAL_TABLET | Freq: Every day | ORAL | 3 refills | Status: DC

## 2017-05-27 MED ORDER — ALBUTEROL SULFATE (2.5 MG/3ML) 0.083% IN NEBU: 2.5000 mg | INHALATION_SOLUTION | Freq: Three times a day (TID) | RESPIRATORY_TRACT | Status: DC

## 2017-05-27 NOTE — Discharge Summary (Signed)
Physician Discharge Summary  [name] HBZ:[id number] DOB: 02-11-1931 DOA: 05/21/2017  PCP: [name], MD  Admit date: 05/21/2017 Discharge date: 05/27/2017  Admitted From: home Disposition:  Home  Recommendations for Outpatient Follow-up:  1. Follow up with PCP in 1 weeks, Check saturations. Check a 2-D echo as an outpatient to evaluate for diastolic dysfunction 2. Follow-up with Dr. Halford Chessman in 2-4 weeks. We'll probably needed PFTs as an outpatient.  Home Health:no Equipment/Devices:None  Discharge Condition:stable CODE STATUS:full Diet recommendation: Heart Healthy  Brief/Interim Summary: 83 year old female with past medical history essential hypertension, chronic A. fib on apixiban, diabetes mellitus type 2 on insulin, COPD not oxygen dependent neck comes in for hypoxia to the ER was found to have multifocal pneumonia.  Discharge Diagnoses:  Principal Problem:   COPD exacerbation ([place]) Active Problems:   ATRIAL FIBRILLATION   Hyperthyroidism   Chronic diastolic heart failure ([facility])   OSA on CPAP   Acute on chronic respiratory failure with hypoxia ([facility])   Multifocal pneumonia   Diabetes ([place])  Acute hypoxic respiratory failure due to COPD exacerbation and multifocal pneumonia: Saturations stabilize with oxygen she was started on IV Solu-Medrol and IV antibiotics. It took about 6 days for her saturations to improve, she was started on Spiriva and albuterol, physical therapy evaluated the patient and recommended home health. She was changed to oral steroids so she will continue for 5 additional days and 2 additional days of oral antibiotics. She will follow-up with her primary physician as an outpatient and get PFTs at this point.  Chronic atrial fibrillation: No changes were made to her medication.  Possible Acute on chronic diastolic congestive heart failure She had a 2-D echo on 03/04/2015, that showed no diastolic dysfunction. In the hospital she was essentially  volume overload she was treated with IV Lasix she was negative about 5 L. Which is compatible with her weight came down from 83 kg to 78.kg. She'll continue her current home Lasix dose with potassium supplementation. Next I she will need a 2-D echo as an outpatient.  Essential hypertension: No changes were made her medication.  Diabetes mellitus type 2: No changes were made to her medication she would be a good candidate to start on metformin as an outpatient.  Coronary artery Disease: No changes made to her medication.  Obstructive sleep apnea: Continued C Pap.  Hyperthyroidism: No changes made to her methimazole.    Discharge Instructions  Discharge Instructions    Diet - low sodium heart healthy    Complete by:  As directed    Increase activity slowly    Complete by:  As directed      Allergies as of 05/27/2017      Reactions   Amlodipine Besylate Other (See Comments)   REACTION: tingling in lips & gum edema   Levaquin [levofloxacin] Shortness Of Breath, Swelling   angioedema   Lobster [shellfish Allergy] Other (See Comments)   angioedema   Penicillins Rash   Has patient had a PCN reaction causing immediate rash, facial/tongue/throat swelling, SOB or lightheadedness with hypotension: Yes Has patient had a PCN reaction causing severe rash involving mucus membranes or skin necrosis: No Has patient had a PCN reaction that required hospitalization: No Has patient had a PCN reaction occurring within the last 10 years: No If all of the above answers are "NO", then may proceed with Cephalosporin use.   Valsartan Other (See Comments)   REACTION: angioedema   Codeine Other (See Comments)   Mental status  changes   Lipitor [atorvastatin] Other (See Comments)   weakness   Oxycodone Other (See Comments)   hallucinations   Statins    Made her too weak   Tramadol Hcl Nausea And Vomiting      Medication List    STOP taking these medications   glucose blood test  strip Commonly known as:  ONE TOUCH ULTRA TEST   ibuprofen 200 MG tablet Commonly known as:  ADVIL,MOTRIN   PRESCRIPTION MEDICATION     TAKE these medications   acetaminophen 500 MG tablet Commonly known as:  TYLENOL Take 1,000 mg by mouth every 6 (six) hours as needed (pain).   albuterol 108 (90 Base) MCG/ACT inhaler Commonly known as:  PROVENTIL HFA;VENTOLIN HFA Inhale 2 puffs into the lungs every 6 (six) hours as needed for wheezing or shortness of breath.   apixaban 5 MG Tabs tablet Commonly known as:  ELIQUIS Take 1 tablet (5 mg total) by mouth 2 (two) times daily.   azithromycin 500 MG tablet Commonly known as:  ZITHROMAX Take 1 tablet (500 mg total) by mouth daily.   carvedilol 12.5 MG tablet Commonly known as:  COREG Take 1 tablet (12.5 mg total) by mouth 2 (two) times daily with a meal.   cefpodoxime 200 MG tablet Commonly known as:  VANTIN Take 1 tablet (200 mg total) by mouth 2 (two) times daily.   cholecalciferol 1000 units tablet Commonly known as:  VITAMIN D Take 1,000 Units by mouth daily after supper.   clotrimazole 1 % cream Commonly known as:  LOTRIMIN Apply 1 application topically 2 (two) times daily. To inner thighs What changed:  when to take this  reasons to take this  additional instructions   Dextromethorphan-Guaifenesin 20-200 MG/10ML Soln Take 10 mLs by mouth 4 (four) times daily as needed (cough/congestion).   diclofenac sodium 1 % Gel Commonly known as:  VOLTAREN Apply 1 application topically 2 (two) times daily as needed (knee pain).   furosemide 40 MG tablet Commonly known as:  LASIX Take 2 tablets (80 mg total) by mouth daily. Start taking on:  05/29/2017 What changed:  These instructions start on 05/29/2017. If you are unsure what to do until then, ask your doctor or other care provider.   gabapentin 100 MG capsule Commonly known as:  NEURONTIN Take 300 mg by mouth at bedtime. What changed:  Another medication with the same  name was removed. Continue taking this medication, and follow the directions you see here.   insulin NPH Human 100 UNIT/ML injection Commonly known as:  HUMULIN N,NOVOLIN N Inject 0.42 mLs (42 Units total) into the skin daily. What changed:  how much to take  when to take this   methimazole 5 MG tablet Commonly known as:  TAPAZOLE Take 1 tablet (5 mg total) by mouth 2 (two) times a week. Wednesday and Friday. What changed:  additional instructions   multivitamin with minerals Tabs tablet Take 1 tablet by mouth daily. Centrum Silver   nitroGLYCERIN 0.4 MG SL tablet Commonly known as:  NITROSTAT Place 0.4 mg under the tongue every 5 (five) minutes as needed for chest pain. Reported on 01/28/2016   onetouch ultrasoft lancets Use to check blood sugar 2 times per day. Dx Code E11.9   potassium chloride SA 20 MEQ tablet Commonly known as:  K-DUR,KLOR-CON Take 1 tablet (50mEq) daily every other day alternating with 2 tablets (20mEq) daily every other day What changed:  how much to take  how to take this  when to take this  additional instructions   predniSONE 10 MG tablet Commonly known as:  DELTASONE Takes 4 tablets for 1 days, then 3 tablets for 1 days, then 2 tabs for 1 days, then 1 tab for 1 days, and then stop.   tiotropium 18 MCG inhalation capsule Commonly known as:  SPIRIVA Place 1 capsule (18 mcg total) into inhaler and inhale daily.   Turmeric 500 MG Caps Take 500 mg by mouth daily after supper.      Follow-up Information    [name], MD. Schedule an appointment as soon as possible for a visit in 1 week(s).   Specialty:  Internal Medicine Contact information: [place] [address] [phone]        Chesley Mires, MD. Schedule an appointment as soon as possible for a visit in 2 week(s).   Specialty:  Pulmonary Disease Contact information: [address] [place] [address] [phone]          Allergies  Allergen  Reactions  . Amlodipine Besylate Other (See Comments)    REACTION: tingling in lips & gum edema  . Levaquin [Levofloxacin] Shortness Of Breath and Swelling    angioedema  . Lobster [Shellfish Allergy] Other (See Comments)    angioedema  . Penicillins Rash    Has patient had a PCN reaction causing immediate rash, facial/tongue/throat swelling, SOB or lightheadedness with hypotension: Yes Has patient had a PCN reaction causing severe rash involving mucus membranes or skin necrosis: No Has patient had a PCN reaction that required hospitalization: No Has patient had a PCN reaction occurring within the last 10 years: No If all of the above answers are "NO", then may proceed with Cephalosporin use.  . Valsartan Other (See Comments)    REACTION: angioedema  . Codeine Other (See Comments)    Mental status changes  . Lipitor [Atorvastatin] Other (See Comments)    weakness  . Oxycodone Other (See Comments)    hallucinations  . Statins     Made her too weak  . Tramadol Hcl Nausea And Vomiting    Consultations:  None   Procedures/Studies: Dg Chest 2 View  Result Date: 05/23/2017 CLINICAL DATA:  Productive cough and shortness of breath for 1 week EXAM: CHEST  2 VIEW COMPARISON:  05/21/2017 FINDINGS: Cardiac shadow is within normal limits. Aortic calcifications are again noted. Lungs are well aerated bilaterally. Mild elevation the right hemidiaphragm is again seen. No acute bony abnormality is seen. IMPRESSION: No acute abnormality noted. Electronically Signed   By: Inez Catalina M.D.   On: 05/23/2017 11:09   Dg Chest 2 View  Result Date: 05/21/2017 CLINICAL DATA:  Productive cough. Shortness of breath. CHF. Atrial fibrillation. Diabetes. EXAM: CHEST  2 VIEW COMPARISON:  08/19/2015 FINDINGS: Suggestion of emphysematous changes and central chronic bronchitic changes in the lungs. No airspace disease or consolidation. No blunting of costophrenic angles. No pneumothorax. Mediastinal contours  appear intact. Normal heart size and pulmonary vascularity. Calcified and tortuous aorta. Degenerative changes in the spine and shoulders. IMPRESSION: Emphysematous changes and chronic bronchitic changes suggested in the lungs. No evidence of active pulmonary disease. Electronically Signed   By: Lucienne Capers M.D.   On: 05/21/2017 22:24   Ct Head Wo Contrast  Result Date: 05/24/2017 CLINICAL DATA:  Found on floor, slipped from chair. History of hypertension, atrial fibrillation. EXAM: CT HEAD WITHOUT CONTRAST TECHNIQUE: Contiguous axial images were obtained from the base of the skull through the vertex without intravenous  contrast. COMPARISON:  CT HEAD May 30, 2015 FINDINGS: BRAIN: No intraparenchymal hemorrhage, mass effect nor midline shift. The ventricles and sulci are normal for age. Patchy supratentorial white matter hypodensities less than expected for patient's age, though non-specific are most compatible with chronic small vessel ischemic disease. No acute large vascular territory infarcts. No abnormal extra-axial fluid collections. Basal cisterns are patent. VASCULAR: Moderate calcific atherosclerosis of the carotid siphons. SKULL: No skull fracture. No significant scalp soft tissue swelling. SINUSES/ORBITS: Mild paranasal sinus mucosal thickening. Mastoid air cells are well aerated.The included ocular globes and orbital contents are non-suspicious. Status post bilateral ocular lens implants. OTHER: None. IMPRESSION: Negative noncontrast CT HEAD for age. Electronically Signed   By: Elon Alas M.D.   On: 05/24/2017 03:44   Ct Chest Wo Contrast  Result Date: 05/25/2017 CLINICAL DATA:  Shortness of breath.  Cough for 1 week.  Hypoxia. EXAM: CT CHEST WITHOUT CONTRAST TECHNIQUE: Multidetector CT imaging of the chest was performed following the standard protocol without IV contrast. COMPARISON:  CT chest 05/24/2015. FINDINGS: Cardiovascular: The heart is mildly enlarged. Atherosclerotic  calcifications are present in the coronary arteries. Calcifications are also present at the aortic valve and aortic arch. There is no aneurysm. Pulmonary artery is are of normal size. No significant pericardial effusion is present. Mediastinum/Nodes: No significant mediastinal or axillary adenopathy is present. Lungs/Pleura: Centrilobular emphysema is again noted. There is no significant nodule, mass, ill-defined micro nodularity is present in the posterior right upper lobe. There is subtle peribronchial disease at the lower lobes bilaterally. No significant airspace consolidation is present. The left upper lobe and right middle lobe are clear. Upper Abdomen: A calcification at the dome of the liver or underneath the right hemidiaphragm is stable. No other focal lesions are present within the visualized upper abdomen. Musculoskeletal: A hemangioma at C7 is stable. Fusion of anterior osteophytes the upper thoracic spine are again noted. Vertebral body heights are maintained. No focal lesions are evident otherwise. IMPRESSION: 1. Ill-defined airspace disease posteriorly in the right upper lobe and lower lobes bilaterally raises concern for infection/multi lobar pneumonia. 2. Emphysema (ICD10-J43.9). Moderate centrilobular emphysema is again seen. 3. Atherosclerosis including coronary artery disease. 4. Stable cardiomegaly without failure. Electronically Signed   By: San Morelle M.D.   On: 05/25/2017 11:05   Subjective: She relates she she feels much better today compared to yesterday. She relates she is close to her baseline.  Discharge Exam: Vitals:   05/26/17 2200 05/27/17 0500  BP: (!) 143/73 (!) 153/76  Pulse: 84 88  Resp: 20 18  Temp: 98.2 F (36.8 C) 98 F (36.7 C)   Vitals:   05/26/17 1938 05/26/17 2200 05/27/17 0500 05/27/17 0737  BP:  (!) 143/73 (!) 153/76   Pulse:  84 88   Resp:  20 18   Temp:  98.2 F (36.8 C) 98 F (36.7 C)   TempSrc:  Oral Oral   SpO2: 92% 93% 96% 94%   Weight:   78.8 kg (173 lb 12.8 oz)   Height:        General exam: In no acute distress. Respiratory system: Good air movement and clear to auscultation appreciated wheezing or rhonchi. Cardiovascular system: She has regular rate and rhythm with positive S1-S2, no appreciated JVDs. Gastrointestinal system: Abdomen is nondistended, soft and nontender.  Central nervous system: Alert and oriented. No focal neurological deficits. Extremities: No pedal edema. Psychiatry: Judgement and insight appear normal. Mood & affect appropriate.      The results  of significant diagnostics from this hospitalization (including imaging, microbiology, ancillary and laboratory) are listed below for reference.     Microbiology: Recent Results (from the past 240 hour(s))  Culture, expectorated sputum-assessment     Status: None   Collection Time: 05/26/17  8:09 AM  Result Value Ref Range Status   Specimen Description EXPECTORATED SPUTUM  Final   Special Requests NONE  Final   Sputum evaluation   Final    Sputum specimen not acceptable for testing.  Please recollect.   Gram Stain Report Called to,Read Back By and Verified With: RN Antionette Poles [phone] 1039AM MLM    Report Status 05/26/2017 FINAL  Final     Labs: BNP (last 3 results)  Recent Labs  05/21/17 1934 05/26/17 1231  BNP 297.8* 979.8*   Basic Metabolic Panel:  Recent Labs Lab 05/21/17 1934 05/22/17 0446 05/24/17 0619 05/25/17 0309 05/26/17 1231  NA 136 139 137 142 137  K 4.2 4.4 4.1 4.5 4.2  CL 100* 103 99* 100* 97*  CO2 27 28 26  33* 27  GLUCOSE 102* 264* 193* 143* 229*  BUN 19 23* 37* 36* 33*  CREATININE 0.90 0.97 0.94 1.04* 1.11*  CALCIUM 9.5 9.3 9.4 9.3 9.3   Liver Function Tests: No results for input(s): AST, ALT, ALKPHOS, BILITOT, PROT, ALBUMIN in the last 168 hours. No results for input(s): LIPASE, AMYLASE in the last 168 hours. No results for input(s): AMMONIA in the last 168 hours. CBC:  Recent Labs Lab  05/21/17 1934 05/22/17 0446 05/26/17 1231  WBC 5.8 6.0 14.8*  HGB 16.3* 15.0 16.9*  HCT 51.9* 48.1* 51.7*  MCV 94.0 93.9 91.8  PLT 167 154 162   Cardiac Enzymes: No results for input(s): CKTOTAL, CKMB, CKMBINDEX, TROPONINI in the last 168 hours. BNP: Invalid input(s): POCBNP CBG:  Recent Labs Lab 05/26/17 0759 05/26/17 1130 05/26/17 1636 05/26/17 2206 05/27/17 0808  GLUCAP 141* 237* 370* 224* 95   D-Dimer No results for input(s): DDIMER in the last 72 hours. Hgb A1c No results for input(s): HGBA1C in the last 72 hours. Lipid Profile No results for input(s): CHOL, HDL, LDLCALC, TRIG, CHOLHDL, LDLDIRECT in the last 72 hours. Thyroid function studies No results for input(s): TSH, T4TOTAL, T3FREE, THYROIDAB in the last 72 hours.  Invalid input(s): FREET3 Anemia work up No results for input(s): VITAMINB12, FOLATE, FERRITIN, TIBC, IRON, RETICCTPCT in the last 72 hours. Urinalysis    Component Value Date/Time   COLORURINE YELLOW 08/29/2016 1632   APPEARANCEUR CLOUDY (A) 08/29/2016 1632   LABSPEC 1.027 08/29/2016 1632   PHURINE 5.5 08/29/2016 1632   GLUCOSEU 100 (A) 08/29/2016 1632   GLUCOSEU 100 (A) 08/19/2015 1440   HGBUR LARGE (A) 08/29/2016 1632   BILIRUBINUR NEGATIVE 08/29/2016 1632   BILIRUBINUR negative 08/26/2016 1137   KETONESUR NEGATIVE 08/29/2016 1632   PROTEINUR 100 (A) 08/29/2016 1632   UROBILINOGEN 0.2 08/26/2016 1137   UROBILINOGEN 0.2 08/19/2015 1440   NITRITE POSITIVE (A) 08/29/2016 1632   LEUKOCYTESUR MODERATE (A) 08/29/2016 1632   Sepsis Labs Invalid input(s): PROCALCITONIN,  WBC,  LACTICIDVEN Microbiology Recent Results (from the past 240 hour(s))  Culture, expectorated sputum-assessment     Status: None   Collection Time: 05/26/17  8:09 AM  Result Value Ref Range Status   Specimen Description EXPECTORATED SPUTUM  Final   Special Requests NONE  Final   Sputum evaluation   Final    Sputum specimen not acceptable for testing.  Please  recollect.   Gram Stain Report Called to,Read  Back By and Verified With: RN Antionette Poles [phone] 1039AM MLM    Report Status 05/26/2017 FINAL  Final     Time coordinating discharge: Over 30 minutes  SIGNED:   Charlynne Cousins, MD  [facility] 05/27/2017, 9:17 AM Pager   If 7PM-7AM, please contact night-coverage [email or web address] Password [email or web address]

## 2017-05-27 NOTE — Progress Notes (Signed)
Results for ALENA, BLANKENBECKLER (MRN [id number]) as of 05/27/2017 08:57  Ref. Range 05/26/2017 07:59 05/26/2017 11:30 05/26/2017 16:36 05/26/2017 22:06 05/27/2017 08:08  Glucose-Capillary Latest Ref Range: 65 - 99 mg/dL 141 (H) 237 (H) 370 (H) 224 (H) 95  Noted that blood sugars have been lower in the am and postprandial blood sugars are elevated. Recommend adding Novolog 6-8 units TID with meals if eating at least 50% of the meal and while on steroids.  Will continue to monitor blood sugars while in the hospital.   Harvel Ricks RN BSN CDE Diabetes Coordinator Pager: [phone]  8am-5pm

## 2017-05-27 NOTE — Progress Notes (Signed)
Patient given discharge instructions and all questions answered.  

## 2017-05-27 NOTE — Progress Notes (Signed)
Patient oxygen saturation 91-93% while ambulating in the hall on room air.

## 2017-05-30 NOTE — Progress Notes (Deleted)
Subjective:    Patient ID: [name], female    DOB: 05-10-1931, 83 y.o.   MRN: [id number]  HPI The patient is here for follow up.  Admitted 05/21/17 - 05/27/17 for COPD exacerbation.  Recommendations for Outpatient Follow-up:  1. Follow up with PCP in 1 weeks, Check saturations. Check a 2-D echo as an outpatient to evaluate for diastolic dysfunction 2. Follow-up with Dr. Halford Chessman in 2-4 weeks. We'll probably needed PFTs as an outpatient.  She presented to the ED with shortness of breath for two days.  She was initially coughing up chunks of green and yellow mucus.  She had wheezing, orthopnea, possible weight gain of 4-5 pounds.  She denies fever, chest pain.  She uses cpap but was not able to use it at home due to her SOB.  She was afebrile, RR 16-28, oxygen sat 89%.  CXR showed chronic bronchitic changes.  She was given 125 mg solu-medrol and azithromycin.  Acute hypoxic respiratory failure due to COPD exacerbation and multifocal pneumonia: She was started on IV Solu-Medrol and IV antibiotics. He was started on oxygen. He took 6 days for her saturations to improve. She was started on Spiriva and albuterol. She was changed to oral steroids and was discharged with 5 additional days to additional days of oral antibiotics. PFTs as an outpatient is recommended.  Possible acute on chronic diastolic congestive heart failure: Her last 2-D echo 02/2015 and showed no diastolic dysfunction. She was volume overloaded in the hospital and treated with IV Lasix and lost approximately 5 L of fluid. She was discharged home with her home Lasix dose and potassium supplementation. 2-D echo was recommended as an outpatient.  Hypertension, chronic atrial fibrillation, coronary artery disease, hyperthyroidism: No changes in medication.  Diabetes type 2: No changes in medication. Thought to be a good candidate for outpatient metformin.  Sleep apnea: Continue CPAP.   Medications and allergies reviewed with  patient and updated if appropriate.  Patient Active Problem List   Diagnosis Date Noted  . COPD exacerbation ([place]) 05/21/2017  . Arthritis of left sacroiliac joint ([place]) 05/04/2017  . Osteopenia 02/17/2017  . Piriformis syndrome of left side 02/16/2017  . Acquired leg length discrepancy 02/16/2017  . COPD (chronic obstructive pulmonary disease) ([place]) 01/27/2017  . Lumbar radiculopathy, acute 01/27/2017  . Rash 01/27/2017  . Granuloma annulare 07/27/2016  . Numbness of fingers 07/27/2016  . Primary osteoarthritis of right knee 06/27/2016  . Diabetes ([name]) 03/07/2016  . Acute renal failure superimposed on stage 3 chronic kidney disease ([place])   . Acute on chronic respiratory failure with hypoxia ([place]) 05/24/2015  . Multifocal pneumonia 05/24/2015  . Sepsis ([place]) 05/24/2015  . OSA on CPAP 08/28/2014  . Chronic diastolic heart failure ([place]) 07/30/2014  . Nocturnal hypoxemia 07/09/2014  . SOB (shortness of breath) 05/15/2014  . Pulmonary HTN ([place]) 05/15/2014  . Crescendo angina ([place]) 05/02/2014  . Band keratopathy 01/16/2014  . 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 Epiphora due to insufficient drainage 06/13/2013  . Hyperthyroidism 11/04/2011  . Multinodular goiter 10/14/2011  . Atrophy, Fuchs' 09/29/2011  . ATRIAL FIBRILLATION 06/03/2010  . CHF 02/17/2010  . VITILIGO 11/07/2009  . SINOATRIAL NODE DYSFUNCTION 06/16/2009  . Iron deficiency anemia 01/02/2009  . ANEMIA, PERNICIOUS 11/20/2008  . Atrial flutter ([place]) 06/25/2008  . HYPERLIPIDEMIA 01/29/2008  . Coronary atherosclerosis 01/29/2008  . HTN (hypertension) 10/30/2007    Current Outpatient Prescriptions on File Prior to Visit  Medication Sig Dispense Refill  .  acetaminophen (TYLENOL) 500 MG tablet Take 1,000 mg by mouth every 6 (six) hours as needed (pain).    Marland Kitchen albuterol (PROVENTIL HFA;VENTOLIN HFA) 108 (90 Base) MCG/ACT inhaler Inhale 2 puffs into the lungs every 6 (six) hours as needed for wheezing or shortness  of breath. 1 Inhaler 2  . apixaban (ELIQUIS) 5 MG TABS tablet Take 1 tablet (5 mg total) by mouth 2 (two) times daily. 180 tablet 3  . azithromycin (ZITHROMAX) 500 MG tablet Take 1 tablet (500 mg total) by mouth daily. 3 tablet 0  . carvedilol (COREG) 12.5 MG tablet Take 1 tablet (12.5 mg total) by mouth 2 (two) times daily with a meal. 180 tablet 3  . cefpodoxime (VANTIN) 200 MG tablet Take 1 tablet (200 mg total) by mouth 2 (two) times daily. 6 tablet 0  . cholecalciferol (VITAMIN D) 1000 units tablet Take 1,000 Units by mouth daily after supper.    . clotrimazole (LOTRIMIN) 1 % cream Apply 1 application topically 2 (two) times daily. To inner thighs (Patient taking differently: Apply 1 application topically 2 (two) times daily as needed (rash). To inner thighs) 30 g 0  . Dextromethorphan-Guaifenesin 20-200 MG/10ML SOLN Take 10 mLs by mouth 4 (four) times daily as needed (cough/congestion).    Marland Kitchen diclofenac sodium (VOLTAREN) 1 % GEL Apply 1 application topically 2 (two) times daily as needed (knee pain).     . furosemide (LASIX) 40 MG tablet Take 2 tablets (80 mg total) by mouth daily. 180 tablet 3  . gabapentin (NEURONTIN) 100 MG capsule Take 300 mg by mouth at bedtime.    . insulin NPH Human (HUMULIN N,NOVOLIN N) 100 UNIT/ML injection Inject 0.42 mLs (42 Units total) into the skin daily. (Patient taking differently: Inject 40 Units into the skin daily before breakfast. ) 20 mL 11  . Lancets (ONETOUCH ULTRASOFT) lancets Use to check blood sugar 2 times per day. Dx Code E11.9 200 each 2  . methimazole (TAPAZOLE) 5 MG tablet Take 1 tablet (5 mg total) by mouth 2 (two) times a week. Wednesday and Friday. (Patient taking differently: Take 5 mg by mouth 2 (two) times a week. Monday and Friday) 40 tablet 2  . Multiple Vitamin (MULTIVITAMIN WITH MINERALS) TABS tablet Take 1 tablet by mouth daily. Centrum Silver    . nitroGLYCERIN (NITROSTAT) 0.4 MG SL tablet Place 0.4 mg under the tongue every 5 (five)  minutes as needed for chest pain. Reported on 01/28/2016    . potassium chloride SA (K-DUR,KLOR-CON) 20 MEQ tablet Take 1 tablet (35mEq) daily every other day alternating with 2 tablets (47mEq) daily every other day (Patient taking differently: Take 20-40 mEq by mouth See admin instructions. Take 1 tablet (58mEq) daily every other day alternating with 2 tablets (5mEq) daily every other day) 135 tablet 3  . predniSONE (DELTASONE) 10 MG tablet Takes 4 tablets for 1 days, then 3 tablets for 1 days, then 2 tabs for 1 days, then 1 tab for 1 days, and then stop. 10 tablet 0  . tiotropium (SPIRIVA) 18 MCG inhalation capsule Place 1 capsule (18 mcg total) into inhaler and inhale daily. 30 capsule 12  . Turmeric 500 MG CAPS Take 500 mg by mouth daily after supper.      No current facility-administered medications on file prior to visit.     Past Medical History:  Diagnosis Date  . Anemia    iron defficiency  . Anginal pain ([place])   . Arthritis    "knees, feet, hands;  joints" (05/27/2015)  . Asthma   . Atrial fibrillation or flutter    s/p RFCA 7/08;   s/p DCCV in past;   previously on amiodarone;  amio stopped due to lung toxicity  . CAD (coronary artery disease)    s/p NSTEMI tx with BMS to OM1 3/08;  cath 3/08: pOM 99% tx with PCI, pLAD 20%, ? mod stenosis at the AM  . CAP (community acquired pneumonia) 05/24/2015  . Chronic diastolic heart failure (HCC)    echo 11/11:  EF 55-60%, severe LVH, mod LAE, mild MR, mildly increased PASP  . COPD (chronic obstructive pulmonary disease) ([place])   . Degenerative joint disease   . Dystrophy, corneal stromal   . Heart murmur   . HLD (hyperlipidemia)   . HTN (hypertension)    essential nos  . Hypopotassemia    PMH of  . Muscle pain   . Myocardial infarction ([place]) 2008  . OSA on CPAP   . Osteoporosis   . Pneumonia 05/27/2015  . Protein calorie malnutrition ([place])   . Rash and nonspecific skin eruption    both arms,awaiting bio   dr Delman Cheadle  . Seasonal  allergies   . Shortness of breath dyspnea   . Type II diabetes mellitus ([place])    Dr Loanne Drilling    Past Surgical History:  Procedure Laterality Date  . A FLUTTER ABLATION     Dr Lovena Le  . CATARACT EXTRACTION W/ INTRAOCULAR LENS  IMPLANT, BILATERAL Bilateral   . CHOLECYSTECTOMY    . COLONOSCOPY  6/07   2 polyps Dr. Kinnie Feil in [place]  . colonoscopy with polypectomy      X 2; [facility]  . CORNEAL TRANSPLANT Bilateral   . CORONARY ANGIOPLASTY WITH STENT PLACEMENT  02/2007   BMS w/Dr Lovena Le  . EYE SURGERY    . gravid 2 para 2    . KNEE CARTILAGE SURGERY Right   . LEFT AND RIGHT HEART CATHETERIZATION WITH CORONARY ANGIOGRAM N/A 05/07/2014   Procedure: LEFT AND RIGHT HEART CATHETERIZATION WITH CORONARY ANGIOGRAM;  Surgeon: Peter M Martinique, MD;  [facility] LAB;  Service: Cardiovascular;  Laterality: N/A;  . TOTAL KNEE ARTHROPLASTY Right 06/28/2016   Procedure: TOTAL KNEE ARTHROPLASTY;  Surgeon: Frederik Pear, MD;  Location: [facility];  Service: Orthopedics;  Laterality: Right;    Social History   Social History  . Marital status: Widowed    Spouse name: N/A  . Number of children: N/A  . Years of education: N/A   Occupational History  . Teacher    Social History Main Topics  . Smoking status: Former Smoker    Packs/day: 0.25    Years: 18.00    Types: Cigarettes    Quit date: 12/27/1968  . Smokeless tobacco: Never Used     Comment: smoked [place], up to 1 pp week  . Alcohol use Yes     Comment: occ  . Drug use: No  . Sexual activity: No   Other Topics Concern  . Not on file   Social History Narrative   Teacher. Widowed. Rarely drinks cafeine.     Family History  Problem Relation Age of Onset  . Diabetes Mother   . Hypertension Mother   . Transient ischemic attack Mother   . Arthritis Mother   . Heart attack Father 42  . Arthritis Father   . Hypertension Father   . Breast cancer Maternal Aunt   . Arthritis Maternal Aunt     Review of Systems  Objective:  There  were no vitals filed for this visit. Wt Readings from Last 3 Encounters:  05/27/17 173 lb 12.8 oz (78.8 kg)  05/04/17 181 lb 3.2 oz (82.2 kg)  05/04/17 180 lb (81.6 kg)   There is no height or weight on file to calculate BMI.   Physical Exam    Constitutional: Appears well-developed and well-nourished. No distress.  HENT:  Head: Normocephalic and atraumatic.  Neck: Neck supple. No tracheal deviation present. No thyromegaly present.  No cervical lymphadenopathy Cardiovascular: Normal rate, regular rhythm and normal heart sounds.   No murmur heard. No carotid bruit .  No edema Pulmonary/Chest: Effort normal and breath sounds normal. No respiratory distress. No has no wheezes. No rales.  Skin: Skin is warm and dry. Not diaphoretic.  Psychiatric: Normal mood and affect. Behavior is normal.      Assessment & Plan:    See Problem List for Assessment and Plan of chronic medical problems.

## 2017-05-31 ENCOUNTER — Inpatient Hospital Stay: Payer: Medicare Other | Admitting: Internal Medicine

## 2017-06-01 ENCOUNTER — Ambulatory Visit (INDEPENDENT_AMBULATORY_CARE_PROVIDER_SITE_OTHER): Payer: Medicare Other | Admitting: Internal Medicine

## 2017-06-01 ENCOUNTER — Telehealth: Payer: Self-pay | Admitting: Internal Medicine

## 2017-06-01 ENCOUNTER — Encounter: Payer: Self-pay | Admitting: Internal Medicine

## 2017-06-01 VITALS — BP 122/60 | HR 82 | Temp 97.5°F | Resp 16 | Wt 174.0 lb

## 2017-06-01 DIAGNOSIS — Z7902 Long term (current) use of antithrombotics/antiplatelets: Secondary | ICD-10-CM | POA: Diagnosis not present

## 2017-06-01 DIAGNOSIS — Z794 Long term (current) use of insulin: Secondary | ICD-10-CM

## 2017-06-01 DIAGNOSIS — I5032 Chronic diastolic (congestive) heart failure: Secondary | ICD-10-CM

## 2017-06-01 DIAGNOSIS — J441 Chronic obstructive pulmonary disease with (acute) exacerbation: Secondary | ICD-10-CM

## 2017-06-01 DIAGNOSIS — E1122 Type 2 diabetes mellitus with diabetic chronic kidney disease: Secondary | ICD-10-CM

## 2017-06-01 DIAGNOSIS — E119 Type 2 diabetes mellitus without complications: Secondary | ICD-10-CM | POA: Diagnosis not present

## 2017-06-01 DIAGNOSIS — I2510 Atherosclerotic heart disease of native coronary artery without angina pectoris: Secondary | ICD-10-CM | POA: Diagnosis not present

## 2017-06-01 DIAGNOSIS — N183 Chronic kidney disease, stage 3 unspecified: Secondary | ICD-10-CM

## 2017-06-01 DIAGNOSIS — I10 Essential (primary) hypertension: Secondary | ICD-10-CM

## 2017-06-01 DIAGNOSIS — R0602 Shortness of breath: Secondary | ICD-10-CM | POA: Diagnosis not present

## 2017-06-01 DIAGNOSIS — J449 Chronic obstructive pulmonary disease, unspecified: Secondary | ICD-10-CM | POA: Diagnosis not present

## 2017-06-01 DIAGNOSIS — M810 Age-related osteoporosis without current pathological fracture: Secondary | ICD-10-CM | POA: Diagnosis not present

## 2017-06-01 DIAGNOSIS — M159 Polyosteoarthritis, unspecified: Secondary | ICD-10-CM | POA: Diagnosis not present

## 2017-06-01 DIAGNOSIS — J9621 Acute and chronic respiratory failure with hypoxia: Secondary | ICD-10-CM

## 2017-06-01 DIAGNOSIS — E785 Hyperlipidemia, unspecified: Secondary | ICD-10-CM | POA: Diagnosis not present

## 2017-06-01 DIAGNOSIS — D509 Iron deficiency anemia, unspecified: Secondary | ICD-10-CM | POA: Diagnosis not present

## 2017-06-01 DIAGNOSIS — I4891 Unspecified atrial fibrillation: Secondary | ICD-10-CM

## 2017-06-01 DIAGNOSIS — G4733 Obstructive sleep apnea (adult) (pediatric): Secondary | ICD-10-CM | POA: Diagnosis not present

## 2017-06-01 DIAGNOSIS — I110 Hypertensive heart disease with heart failure: Secondary | ICD-10-CM | POA: Diagnosis not present

## 2017-06-01 DIAGNOSIS — I252 Old myocardial infarction: Secondary | ICD-10-CM | POA: Diagnosis not present

## 2017-06-01 MED ORDER — BUDESONIDE-FORMOTEROL FUMARATE 160-4.5 MCG/ACT IN AERO: 2.0000 | INHALATION_SPRAY | Freq: Two times a day (BID) | RESPIRATORY_TRACT | 3 refills | Status: DC

## 2017-06-01 MED ORDER — NYSTATIN 100000 UNIT/ML MT SUSP: 5.0000 mL | Freq: Four times a day (QID) | OROMUCOSAL | 0 refills | Status: DC

## 2017-06-01 NOTE — Progress Notes (Signed)
Subjective:    Patient ID: [name], female    DOB: November 26, 1931, 83 y.o.   MRN: [id number]  HPI The patient is here for follow up.  Admitted 05/21/17 - 05/27/17 for COPD exacerbation.  Recommendations for Outpatient Follow-up:  1. Follow up with PCP in 1 weeks, Check saturations. Check a 2-D echo as an outpatient to evaluate for diastolic dysfunction 2. Follow-up with Dr. Halford Chessman in 2-4 weeks. We'll probably needed PFTs as an outpatient.  She presented to the ED with shortness of breath for two days.  She was initially coughing up chunks of green and yellow mucus.  She had wheezing, orthopnea, possible weight gain of 4-5 pounds.  She denies fever, chest pain.  She uses cpap but was not able to use it at home due to her SOB.  She was afebrile, RR 16-28, oxygen sat 89%.  CXR showed chronic bronchitic changes.  She was given 125 mg solu-medrol and azithromycin.  Acute hypoxic respiratory failure due to COPD exacerbation and multifocal pneumonia: She was started on IV Solu-Medrol and IV antibiotics. He was started on oxygen. He took 6 days for her saturations to improve. She was started on Spiriva and albuterol. She was changed to oral steroids and was discharged with 5 additional days to additional days of oral antibiotics and prednisone. PFTs as an outpatient is recommended.   She has some soreness in her lungs.  She has a follow up with pulmonary tomorrow.  Her lungs still feel tight.  She feels 80% as far as her lungs are concerned.  She took spiriva today.  She is still coughing, but it is better. She has not taken the albuterol.  She does not have another maintenance inhaler.   Possible acute on chronic diastolic congestive heart failure: Her last 2-D echo 02/2015 and showed no diastolic dysfunction. She was volume overloaded in the hospital and treated with IV Lasix and lost approximately 5 L of fluid. She was discharged home with her home Lasix dose and potassium supplementation. 2-D echo  was recommended as an outpatient.  She does have an appointment with Dr Tempie Hoist.    Hypertension, chronic atrial fibrillation, coronary artery disease, hyperthyroidism: No changes in medication.  Diabetes type 2: No changes in medication. Thought to be a good candidate for outpatient metformin.  This morning her sugar was 81.  She has also had 111-150.    Sleep apnea: Continue CPAP.   Her ear plugged.  She denies ear pain.  She feels lightheaded.  That has been there since she has been home from the hospital.  She thought it was from the medication she was on - there was some improvement since yesterday.    She is having a hard time affording her inhalers.  The inhalers and her blood thinner are very expensive.    Medications and allergies reviewed with patient and updated if appropriate.  Patient Active Problem List   Diagnosis Date Noted  . COPD exacerbation ([place]) 05/21/2017  . Arthritis of left sacroiliac joint ([place]) 05/04/2017  . Osteopenia 02/17/2017  . Piriformis syndrome of left side 02/16/2017  . Acquired leg length discrepancy 02/16/2017  . COPD (chronic obstructive pulmonary disease) ([place]) 01/27/2017  . Lumbar radiculopathy, acute 01/27/2017  . Rash 01/27/2017  . Granuloma annulare 07/27/2016  . Numbness of fingers 07/27/2016  . Primary osteoarthritis of right knee 06/27/2016  . Diabetes ([place]) 03/07/2016  . Acute renal failure superimposed on stage 3 chronic kidney disease ([place])   .  Acute on chronic respiratory failure with hypoxia ([place]) 05/24/2015  . Multifocal pneumonia 05/24/2015  . Sepsis ([place]) 05/24/2015  . OSA on CPAP 08/28/2014  . Chronic diastolic heart failure ([place]) 07/30/2014  . Nocturnal hypoxemia 07/09/2014  . SOB (shortness of breath) 05/15/2014  . Pulmonary HTN ([place]) 05/15/2014  . Crescendo angina ([place]) 05/02/2014  . Band keratopathy 01/16/2014  . 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 Epiphora due to insufficient drainage 06/13/2013  .  Hyperthyroidism 11/04/2011  . Multinodular goiter 10/14/2011  . Atrophy, Fuchs' 09/29/2011  . ATRIAL FIBRILLATION 06/03/2010  . CHF 02/17/2010  . VITILIGO 11/07/2009  . SINOATRIAL NODE DYSFUNCTION 06/16/2009  . Iron deficiency anemia 01/02/2009  . ANEMIA, PERNICIOUS 11/20/2008  . Atrial flutter ([place]) 06/25/2008  . HYPERLIPIDEMIA 01/29/2008  . Coronary atherosclerosis 01/29/2008  . HTN (hypertension) 10/30/2007    Current Outpatient Prescriptions on File Prior to Visit  Medication Sig Dispense Refill  . acetaminophen (TYLENOL) 500 MG tablet Take 1,000 mg by mouth every 6 (six) hours as needed (pain).    Marland Kitchen albuterol (PROVENTIL HFA;VENTOLIN HFA) 108 (90 Base) MCG/ACT inhaler Inhale 2 puffs into the lungs every 6 (six) hours as needed for wheezing or shortness of breath. 1 Inhaler 2  . apixaban (ELIQUIS) 5 MG TABS tablet Take 1 tablet (5 mg total) by mouth 2 (two) times daily. 180 tablet 3  . carvedilol (COREG) 12.5 MG tablet Take 1 tablet (12.5 mg total) by mouth 2 (two) times daily with a meal. 180 tablet 3  . cefpodoxime (VANTIN) 200 MG tablet Take 1 tablet (200 mg total) by mouth 2 (two) times daily. 6 tablet 0  . cholecalciferol (VITAMIN D) 1000 units tablet Take 1,000 Units by mouth daily after supper.    . clotrimazole (LOTRIMIN) 1 % cream Apply 1 application topically 2 (two) times daily. To inner thighs (Patient taking differently: Apply 1 application topically 2 (two) times daily as needed (rash). To inner thighs) 30 g 0  . Dextromethorphan-Guaifenesin 20-200 MG/10ML SOLN Take 10 mLs by mouth 4 (four) times daily as needed (cough/congestion).    Marland Kitchen diclofenac sodium (VOLTAREN) 1 % GEL Apply 1 application topically 2 (two) times daily as needed (knee pain).     . furosemide (LASIX) 40 MG tablet Take 2 tablets (80 mg total) by mouth daily. 180 tablet 3  . gabapentin (NEURONTIN) 100 MG capsule Take 300 mg by mouth at bedtime.    . insulin NPH Human (HUMULIN N,NOVOLIN N) 100 UNIT/ML  injection Inject 0.42 mLs (42 Units total) into the skin daily. (Patient taking differently: Inject 40 Units into the skin daily before breakfast. ) 20 mL 11  . Lancets (ONETOUCH ULTRASOFT) lancets Use to check blood sugar 2 times per day. Dx Code E11.9 200 each 2  . methimazole (TAPAZOLE) 5 MG tablet Take 1 tablet (5 mg total) by mouth 2 (two) times a week. Wednesday and Friday. (Patient taking differently: Take 5 mg by mouth 2 (two) times a week. Monday and Friday) 40 tablet 2  . Multiple Vitamin (MULTIVITAMIN WITH MINERALS) TABS tablet Take 1 tablet by mouth daily. Centrum Silver    . nitroGLYCERIN (NITROSTAT) 0.4 MG SL tablet Place 0.4 mg under the tongue every 5 (five) minutes as needed for chest pain. Reported on 01/28/2016    . potassium chloride SA (K-DUR,KLOR-CON) 20 MEQ tablet Take 1 tablet (92mEq) daily every other day alternating with 2 tablets (31mEq) daily every other day (Patient taking differently: Take 20-40 mEq by mouth See admin instructions.  Take 1 tablet (74mEq) daily every other day alternating with 2 tablets (73mEq) daily every other day) 135 tablet 3  . tiotropium (SPIRIVA) 18 MCG inhalation capsule Place 1 capsule (18 mcg total) into inhaler and inhale daily. 30 capsule 12  . Turmeric 500 MG CAPS Take 500 mg by mouth daily after supper.      No current facility-administered medications on file prior to visit.     Past Medical History:  Diagnosis Date  . Anemia    iron defficiency  . Anginal pain ([place])   . Arthritis    "knees, feet, hands; joints" (05/27/2015)  . Asthma   . Atrial fibrillation or flutter    s/p RFCA 7/08;   s/p DCCV in past;   previously on amiodarone;  amio stopped due to lung toxicity  . CAD (coronary artery disease)    s/p NSTEMI tx with BMS to OM1 3/08;  cath 3/08: pOM 99% tx with PCI, pLAD 20%, ? mod stenosis at the AM  . CAP (community acquired pneumonia) 05/24/2015  . Chronic diastolic heart failure (HCC)    echo 11/11:  EF 55-60%, severe LVH, mod  LAE, mild MR, mildly increased PASP  . COPD (chronic obstructive pulmonary disease) ([place])   . Degenerative joint disease   . Dystrophy, corneal stromal   . Heart murmur   . HLD (hyperlipidemia)   . HTN (hypertension)    essential nos  . Hypopotassemia    PMH of  . Muscle pain   . Myocardial infarction ([place]) 2008  . OSA on CPAP   . Osteoporosis   . Pneumonia 05/27/2015  . Protein calorie malnutrition ([place])   . Rash and nonspecific skin eruption    both arms,awaiting bio   dr Delman Cheadle  . Seasonal allergies   . Shortness of breath dyspnea   . Type II diabetes mellitus ([place])    Dr Loanne Drilling    Past Surgical History:  Procedure Laterality Date  . A FLUTTER ABLATION     Dr Lovena Le  . CATARACT EXTRACTION W/ INTRAOCULAR LENS  IMPLANT, BILATERAL Bilateral   . CHOLECYSTECTOMY    . COLONOSCOPY  6/07   2 polyps Dr. Kinnie Feil in [place]  . colonoscopy with polypectomy      X 2; [facility]  . CORNEAL TRANSPLANT Bilateral   . CORONARY ANGIOPLASTY WITH STENT PLACEMENT  02/2007   BMS w/Dr Lovena Le  . EYE SURGERY    . gravid 2 para 2    . KNEE CARTILAGE SURGERY Right   . LEFT AND RIGHT HEART CATHETERIZATION WITH CORONARY ANGIOGRAM N/A 05/07/2014   Procedure: LEFT AND RIGHT HEART CATHETERIZATION WITH CORONARY ANGIOGRAM;  Surgeon: Peter M Martinique, MD;  Location: [facility] CATH LAB;  Service: Cardiovascular;  Laterality: N/A;  . TOTAL KNEE ARTHROPLASTY Right 06/28/2016   Procedure: TOTAL KNEE ARTHROPLASTY;  Surgeon: Frederik Pear, MD;  Location: [facility];  Service: Orthopedics;  Laterality: Right;    Social History   Social History  . Marital status: Widowed    Spouse name: N/A  . Number of children: N/A  . Years of education: N/A   Occupational History  . Teacher    Social History Main Topics  . Smoking status: Former Smoker    Packs/day: 0.25    Years: 18.00    Types: Cigarettes    Quit date: 12/27/1968  . Smokeless tobacco: Never Used     Comment: smoked [place], up to 1 pp week  . Alcohol use Yes  Comment: occ  . Drug use: No  . Sexual activity: No   Other Topics Concern  . None   Social History Sports administrator. Widowed. Rarely drinks cafeine.     Family History  Problem Relation Age of Onset  . Diabetes Mother   . Hypertension Mother   . Transient ischemic attack Mother   . Arthritis Mother   . Heart attack Father 18  . Arthritis Father   . Hypertension Father   . Breast cancer Maternal Aunt   . Arthritis Maternal Aunt     Review of Systems  Constitutional: Positive for appetite change (decreased, improved) and fatigue. Negative for chills and fever.  Respiratory: Positive for cough (dry), chest tightness and shortness of breath (greatly improved). Negative for wheezing.   Cardiovascular: Positive for leg swelling. Negative for chest pain and palpitations.  Gastrointestinal: Positive for nausea. Negative for abdominal pain.       No gerd  Neurological: Positive for light-headedness and headaches.       Objective:   Vitals:   06/01/17 0939  BP: 122/60  Pulse: 82  Resp: 16  Temp: 97.5 F (36.4 C)   Wt Readings from Last 3 Encounters:  06/01/17 174 lb (78.9 kg)  05/27/17 173 lb 12.8 oz (78.8 kg)  05/04/17 181 lb 3.2 oz (82.2 kg)   Body mass index is 31.83 kg/m.   Physical Exam    Physical Exam GENERAL APPEARANCE: Appears stated age, well appearing, NAD EYES: conjunctiva clear, no icterus HEENT: bilateral tympanic membranes and ear canals normal, oropharynx with no erythema, tongue with slight white coating and appears red and thickened, no thyromegaly, trachea midline, no cervical or supraclavicular lymphadenopathy LUNGS: Mild SOB with activity and prolonged talking,  No respiratory distress.  Mild tightness but no obvious wheeze or crackles. good air entry bilaterally HEART: Normal S1,S2 without murmurs EXTREMITIES: 1 + mild pitting edema b/l LE     Assessment & Plan:    See Problem List for Assessment and Plan of chronic medical problems.

## 2017-06-01 NOTE — Addendum Note (Signed)
Addended by: [name] on: 06/01/2017 09:07 PM   Modules accepted: Orders

## 2017-06-01 NOTE — Telephone Encounter (Signed)
LVM giving Clair Gulling verbal orders for PT per MD. Please enter social work referral.

## 2017-06-01 NOTE — Patient Instructions (Addendum)
Have blood work done today or tomorrow.   Use the nystatin as directed for the thrush.  Use the symbicort inhaler twice a day.  Rinse your mouth out afterwards.    Follow up with me in 6 months, sooner if needed

## 2017-06-01 NOTE — Telephone Encounter (Signed)
Clair Gulling from [facility], [phone]  Needs verbals for PT 2x2    And needs a referral for a socia worker for community resources to pay for medicines

## 2017-06-01 NOTE — Assessment & Plan Note (Signed)
Much improved, but still SOB Continue spiriva Trial of symbicort Albuterol as needed Has pulmonary and cardio referral

## 2017-06-01 NOTE — Telephone Encounter (Signed)
Noted  

## 2017-06-01 NOTE — Telephone Encounter (Signed)
Jim with [facility] called to state that the patient requested a delay in the visit. I informed him she had a appointment here today. He states he will call tomorrow after seeing patient to get verbal orders. Thank you.   English

## 2017-06-01 NOTE — Assessment & Plan Note (Signed)
Not fluid overloaded -  Has mild leg edema Continue current dose of lasix Has f/u with Dr Tempie Hoist

## 2017-06-01 NOTE — Assessment & Plan Note (Signed)
Much improved - per patient she feels 1000x better  - still with symptoms SOB, cough, wheeze, lungs tight on exam Continue spiriva Sample of symbicort 160 mg given for her to try over the next 24 hrs Albuterol as needed Has an appt with Dr Melvyn Novas tomorrow

## 2017-06-01 NOTE — Assessment & Plan Note (Signed)
BP well controlled Current regimen effective and well tolerated Continue current medications at current doses  

## 2017-06-01 NOTE — Assessment & Plan Note (Signed)
following with Dr Loanne Drilling Sugars elevated in hospital due to steroids Have been controlled at home - one low -- monitor closely

## 2017-06-01 NOTE — Telephone Encounter (Signed)
SW referral ordered

## 2017-06-02 ENCOUNTER — Ambulatory Visit (INDEPENDENT_AMBULATORY_CARE_PROVIDER_SITE_OTHER)
Admission: RE | Admit: 2017-06-02 | Discharge: 2017-06-02 | Disposition: A | Discharge status: Home / Self Care | Payer: Medicare Other | Source: Ambulatory Visit | Attending: Internal Medicine | Admitting: Internal Medicine

## 2017-06-02 ENCOUNTER — Encounter: Payer: Self-pay | Admitting: Internal Medicine

## 2017-06-02 ENCOUNTER — Other Ambulatory Visit (INDEPENDENT_AMBULATORY_CARE_PROVIDER_SITE_OTHER): Payer: Medicare Other

## 2017-06-02 ENCOUNTER — Ambulatory Visit (INDEPENDENT_AMBULATORY_CARE_PROVIDER_SITE_OTHER): Payer: Medicare Other | Admitting: Internal Medicine

## 2017-06-02 VITALS — BP 120/74 | HR 70 | Ht 62.0 in | Wt 173.0 lb

## 2017-06-02 DIAGNOSIS — J9621 Acute and chronic respiratory failure with hypoxia: Secondary | ICD-10-CM

## 2017-06-02 DIAGNOSIS — R0602 Shortness of breath: Secondary | ICD-10-CM | POA: Diagnosis not present

## 2017-06-02 DIAGNOSIS — I10 Essential (primary) hypertension: Secondary | ICD-10-CM

## 2017-06-02 DIAGNOSIS — J441 Chronic obstructive pulmonary disease with (acute) exacerbation: Secondary | ICD-10-CM

## 2017-06-02 DIAGNOSIS — J449 Chronic obstructive pulmonary disease, unspecified: Secondary | ICD-10-CM

## 2017-06-02 DIAGNOSIS — R05 Cough: Secondary | ICD-10-CM | POA: Diagnosis not present

## 2017-06-02 LAB — COMPREHENSIVE METABOLIC PANEL
ALBUMIN: 3.3 g/dL — AB (ref 3.5–5.2)
ALT: 43 U/L — AB (ref 0–35)
AST: 25 U/L (ref 0–37)
Alkaline Phosphatase: 76 U/L (ref 39–117)
BILIRUBIN TOTAL: 0.5 mg/dL (ref 0.2–1.2)
BUN: 32 mg/dL — ABNORMAL HIGH (ref 6–23)
CALCIUM: 9.2 mg/dL (ref 8.4–10.5)
CO2: 29 meq/L (ref 19–32)
CREATININE: 1.09 mg/dL (ref 0.40–1.20)
Chloride: 98 mEq/L (ref 96–112)
GFR: 61.57 mL/min (ref >60.00)
Glucose, Bld: 361 mg/dL — ABNORMAL HIGH (ref 70–99)
Potassium: 4 mEq/L (ref 3.5–5.1)
Sodium: 135 mEq/L (ref 135–145)
Total Protein: 6.5 g/dL (ref 6.0–8.3)

## 2017-06-02 LAB — CBC WITH DIFFERENTIAL/PLATELET
BASOS ABS: 0.1 10*3/uL (ref 0.0–0.1)
BASOS PCT: 0.4 % (ref 0.0–3.0)
Eosinophils Absolute: 0.3 10*3/uL (ref 0.0–0.7)
Eosinophils Relative: 2.4 % (ref 0.0–5.0)
HEMATOCRIT: 47.8 % — AB (ref 36.0–46.0)
HEMOGLOBIN: 15.5 g/dL — AB (ref 12.0–15.0)
Lymphs Abs: 0.9 10*3/uL (ref 0.7–4.0)
MCHC: 32.3 g/dL (ref 30.0–36.0)
MCV: 90.8 fl (ref 78.0–100.0)
MONOS PCT: 7.6 % (ref 3.0–12.0)
Monocytes Absolute: 0.9 10*3/uL (ref 0.1–1.0)
NEUTROS ABS: 10.1 10*3/uL — AB (ref 1.4–7.7)
Neutrophils Relative %: 82.2 % — ABNORMAL HIGH (ref 43.0–77.0)
PLATELETS: 176 10*3/uL (ref 150.0–400.0)
RBC: 5.27 Mil/uL — ABNORMAL HIGH (ref 3.87–5.11)
RDW: 14.2 % (ref 11.5–15.5)
WBC: 12.3 10*3/uL — AB (ref 4.0–10.5)

## 2017-06-02 MED ORDER — MOMETASONE FURO-FORMOTEROL FUM 100-5 MCG/ACT IN AERO: 2.0000 | INHALATION_SPRAY | Freq: Two times a day (BID) | RESPIRATORY_TRACT | 0 refills | Status: DC

## 2017-06-02 NOTE — Patient Instructions (Addendum)
Stop spiriva and continue symbiocort 80(or dulera 100)  Take 2 puffs first thing in am and then another 2 puffs about 12 hours later.     Work on inhaler technique:  relax and gently blow all the way out then take a nice smooth deep breath back in, triggering the inhaler at same time you start breathing in.  Hold for up to 5 seconds if you can. Blow out thru nose. Rinse and gargle with water when done   Please remember to go to the  x-ray department downstairs in the basement  for your tests - we will call you with the results when they are available.     Please schedule a follow up office visit in 4 weeks, sooner if needed

## 2017-06-02 NOTE — Progress Notes (Signed)
Subjective:     Patient ID: [name], female   DOB: 12-08-1931,    MRN: [id number]  HPI  48 yobf quit smoking  1970 with GOLD I criteria for copd as of 04/2014 but with baseline able to work out at [employer] and eliptical x one mile x 40 min s inhalers  then abuptly ill  sob/cough x 2 weeks p onset admitted:  Admit date: 05/21/2017 Discharge date: 05/27/2017  Brief/Interim Summary: 83 year old female with past medical history essential hypertension, chronic A. fib on apixiban, diabetes mellitus type 2 on insulin, COPD not oxygen dependent neck comes in for hypoxia to the ER was found to have multifocal pneumonia.  Discharge Diagnoses:  Principal Problem:   COPD exacerbation ([place]) -   Active Problems:   ATRIAL FIBRILLATION   Hyperthyroidism   Chronic diastolic heart failure (HCC)   OSA on CPAP   Acute on chronic respiratory failure with hypoxia (HCC)   Multifocal pneumonia   Diabetes ([place])  Acute hypoxic respiratory failure due to COPD exacerbation and multifocal pneumonia: Saturations stabilize with oxygen she was started on IV Solu-Medrol and IV antibiotics. It took about 6 days for her saturations to improve, she was started on Spiriva and albuterol, physical therapy evaluated the patient and recommended home health. She was changed to oral steroids so she will continue for 5 additional days and 2 additional days of oral antibiotics. She will follow-up with her primary physician as an outpatient and get PFTs at this point.  Chronic atrial fibrillation: No changes were made to her medication.  Possible Acute on chronic diastolic congestive heart failure She had a 2-D echo on 03/04/2015, that showed no diastolic dysfunction. In the hospital she was essentially volume overload she was treated with IV Lasix she was negative about 5 L. Which is compatible with her weight came down from 83 kg to 78.kg. She'll continue her current home Lasix dose with potassium supplementation.  Next I she will need a 2-D echo as an outpatient.  Essential hypertension: No changes were made her medication.  Diabetes mellitus type 2: No changes were made to her medication she would be a good candidate to start on metformin as an outpatient.  Coronary artery Disease: No changes made to her medication.  Obstructive sleep apnea: Continued C Pap.  Hyperthyroidism: No changes made to her methimazole     06/02/2017 1st [facility] office visit/ Delorean Knutzen  GOLD I COPD now on symb 160/spiriva  Chief Complaint  Patient presents with  . Pulmonary Consult    Referred by hospitalist after having recent "COPD exacerbation" hospitalized from 05/21/17 until 05/27/17.  She states her breathing has improved some, but not back at her normal baseline.  She is coughing with no mucus production.   more than 50% back to baseline but  still lacks energy / can't smell  Doe = MMRC2 = can't walk a nl pace on a flat grade s sob but does fine slow and flat eg  No obvious day to day or daytime variability or assoc excess/ purulent sputum or mucus plugs or hemoptysis or cp or chest tightness, subjective wheeze or overt sinus or hb symptoms. No unusual exp hx or h/o childhood pna/ asthma or knowledge of premature birth.  Sleeping ok without nocturnal  or early am exacerbation  of respiratory  c/o's or need for noct saba. Also denies any obvious fluctuation of symptoms with weather or environmental changes or other aggravating or alleviating factors except as outlined above  Current Medications, Allergies, Complete Past Medical History, Past Surgical History, Family History, and Social History were reviewed in [employer] record.  ROS  The following are not active complaints unless bolded sore throat, dysphagia, dental problems, itching, sneezing,  nasal congestion or excess/ purulent secretions, ear ache,   fever, chills, sweats, unintended wt loss, classically pleuritic or  exertional cp,  orthopnea pnd or leg swelling - ankles only, presyncope, palpitations, abdominal pain, anorexia, nausea, vomiting, diarrhea  or change in bowel or bladder habits, change in stools or urine, dysuria,hematuria,  rash, arthralgias, visual complaints, headache, numbness, weakness or ataxia or problems with walking or coordination,  change in mood/affect or memory.         Review of Systems     Objective:   Physical Exam  amb pleasant bf nad  Wt Readings from Last 3 Encounters:  06/02/17 173 lb (78.5 kg)  06/01/17 174 lb (78.9 kg)  05/27/17 173 lb 12.8 oz (78.8 kg)    Vital signs reviewed  - - Note on arrival 02 sats  94% on RA     HEENT: nl dentition, turbinates bilaterally, and oropharynx. Nl external ear canals without cough reflex   NECK :  without JVD/Nodes/TM/ nl carotid upstrokes bilaterally   LUNGS: no acc muscle use,  Nl contour chest which is clear to A and P bilaterally without cough on insp or exp maneuvers   CV:  RRR  no s3 or murmur or increase in P2, and no edema   ABD:  soft and nontender with nl inspiratory excursion in the supine position. No bruits or organomegaly appreciated, bowel sounds nl  MS:  Nl gait/ ext warm without deformities, calf tenderness, cyanosis or clubbing No obvious joint restrictions   SKIN: warm and dry without lesions    NEURO:  alert, approp, nl sensorium with  no motor or cerebellar deficits apparent.      I personally reviewed images and agree with radiology impression as follows:   Chest CT 04/3017 1. Ill-defined airspace disease posteriorly in the right upper lobe and lower lobes bilaterally raises concern for infection/multi lobar pneumonia. 2. Emphysema (ICD10-J43.9). Moderate centrilobular emphysema is again seen. 3. Atherosclerosis including coronary artery disease. 4. Stable cardiomegaly without failure.   CXR PA and Lateral:   06/02/2017 :    I personally reviewed images and agree with radiology  impression as follows:    The lungs are well-expanded. There is no focal infiltrate the interstitial markings are mildly prominent but stable. There is no pleural effusion. The heart and pulmonary vascularity are normal. There is calcification in the wall of the aortic arch. There is gentle levocurvature centered at the thoracolumbar junction.     Assessment:

## 2017-06-03 NOTE — Assessment & Plan Note (Addendum)
-   quit smoking 1970 - PFT's  05/21/14  FEV1 1.37 (94 % ) ratio 66  p no % improvement from saba p ? prior to study with DLCO  39 % corrects to 54  % for alv volume  With classic curvature  -06/02/2017  After extensive coaching HFA effectiveness =    50%  So rx symbicort 80 mg 2bid  She only had very mild copd previously and I doubt she has much worse dz now so reasonable to rx with just symb 80 2bid trial basis to see what if any change she perceives in doe / activity tol and return for f/u in 4 weeks for repeat spirometry at that point.   I had an extended discussion with the patient reviewing all relevant studies completed to date and  lasting 25 minutes of a 40  minute transition of care office  visit post hosp with pt not prev known to me with  non-specific but potentially very serious refractory respiratory symptoms of uncertain and potentially multiple  etiologies.   Each maintenance medication was reviewed in detail including most importantly the difference between maintenance and prns and under what circumstances the prns are to be triggered using an action plan format that is not reflected in the computer generated alphabetically organized AVS.    Please see AVS for specific instructions unique to this office visit that I personally wrote and verbalized to the the pt in detail and then reviewed with pt  by my nurse highlighting any changes in therapy/plan of care  recommended at today's visit.

## 2017-06-03 NOTE — Progress Notes (Signed)
Spoke with pt and notified of results per Dr. Wert. Pt verbalized understanding and denied any questions. 

## 2017-06-06 ENCOUNTER — Other Ambulatory Visit: Payer: Self-pay | Admitting: *Deleted

## 2017-06-06 ENCOUNTER — Ambulatory Visit: Payer: Medicare Other | Admitting: Dietician

## 2017-06-06 DIAGNOSIS — M810 Age-related osteoporosis without current pathological fracture: Secondary | ICD-10-CM | POA: Diagnosis not present

## 2017-06-06 DIAGNOSIS — Z7902 Long term (current) use of antithrombotics/antiplatelets: Secondary | ICD-10-CM | POA: Diagnosis not present

## 2017-06-06 DIAGNOSIS — M159 Polyosteoarthritis, unspecified: Secondary | ICD-10-CM | POA: Diagnosis not present

## 2017-06-06 DIAGNOSIS — Z794 Long term (current) use of insulin: Secondary | ICD-10-CM | POA: Diagnosis not present

## 2017-06-06 DIAGNOSIS — I252 Old myocardial infarction: Secondary | ICD-10-CM | POA: Diagnosis not present

## 2017-06-06 DIAGNOSIS — G4733 Obstructive sleep apnea (adult) (pediatric): Secondary | ICD-10-CM | POA: Diagnosis not present

## 2017-06-06 DIAGNOSIS — I110 Hypertensive heart disease with heart failure: Secondary | ICD-10-CM | POA: Diagnosis not present

## 2017-06-06 DIAGNOSIS — E119 Type 2 diabetes mellitus without complications: Secondary | ICD-10-CM | POA: Diagnosis not present

## 2017-06-06 DIAGNOSIS — J449 Chronic obstructive pulmonary disease, unspecified: Secondary | ICD-10-CM | POA: Diagnosis not present

## 2017-06-06 DIAGNOSIS — I4891 Unspecified atrial fibrillation: Secondary | ICD-10-CM | POA: Diagnosis not present

## 2017-06-06 DIAGNOSIS — D509 Iron deficiency anemia, unspecified: Secondary | ICD-10-CM | POA: Diagnosis not present

## 2017-06-06 DIAGNOSIS — I2510 Atherosclerotic heart disease of native coronary artery without angina pectoris: Secondary | ICD-10-CM | POA: Diagnosis not present

## 2017-06-06 DIAGNOSIS — I5032 Chronic diastolic (congestive) heart failure: Secondary | ICD-10-CM | POA: Diagnosis not present

## 2017-06-06 DIAGNOSIS — E785 Hyperlipidemia, unspecified: Secondary | ICD-10-CM | POA: Diagnosis not present

## 2017-06-07 DIAGNOSIS — I4891 Unspecified atrial fibrillation: Secondary | ICD-10-CM | POA: Diagnosis not present

## 2017-06-07 DIAGNOSIS — E119 Type 2 diabetes mellitus without complications: Secondary | ICD-10-CM | POA: Diagnosis not present

## 2017-06-07 DIAGNOSIS — J449 Chronic obstructive pulmonary disease, unspecified: Secondary | ICD-10-CM | POA: Diagnosis not present

## 2017-06-07 DIAGNOSIS — I2510 Atherosclerotic heart disease of native coronary artery without angina pectoris: Secondary | ICD-10-CM | POA: Diagnosis not present

## 2017-06-07 DIAGNOSIS — G4733 Obstructive sleep apnea (adult) (pediatric): Secondary | ICD-10-CM | POA: Diagnosis not present

## 2017-06-07 DIAGNOSIS — Z7902 Long term (current) use of antithrombotics/antiplatelets: Secondary | ICD-10-CM | POA: Diagnosis not present

## 2017-06-07 DIAGNOSIS — I110 Hypertensive heart disease with heart failure: Secondary | ICD-10-CM | POA: Diagnosis not present

## 2017-06-07 DIAGNOSIS — M159 Polyosteoarthritis, unspecified: Secondary | ICD-10-CM | POA: Diagnosis not present

## 2017-06-07 DIAGNOSIS — I252 Old myocardial infarction: Secondary | ICD-10-CM | POA: Diagnosis not present

## 2017-06-07 DIAGNOSIS — D509 Iron deficiency anemia, unspecified: Secondary | ICD-10-CM | POA: Diagnosis not present

## 2017-06-07 DIAGNOSIS — E785 Hyperlipidemia, unspecified: Secondary | ICD-10-CM | POA: Diagnosis not present

## 2017-06-07 DIAGNOSIS — M810 Age-related osteoporosis without current pathological fracture: Secondary | ICD-10-CM | POA: Diagnosis not present

## 2017-06-07 DIAGNOSIS — I5032 Chronic diastolic (congestive) heart failure: Secondary | ICD-10-CM | POA: Diagnosis not present

## 2017-06-07 DIAGNOSIS — Z794 Long term (current) use of insulin: Secondary | ICD-10-CM | POA: Diagnosis not present

## 2017-06-07 NOTE — Patient Outreach (Addendum)
[facility]) Care Management  06/07/2017  [name] 11/05/1931 [id number]   Telephone Screen  Referral Date:06/03/2017 Referral Source: [facility] High Risk  Referral Reason: COPD Recent Discharge from:  [facility] Insurance: [facility] Medicare   Outreach attempt # 2 spoke with patient regarding recent discharge from [facility]. HIPAA verified with patient.   Social:   Patient lives with her son. She requires minimum assist and "extra time" with completing her ADLs. Patient's son transports her to all medical appointments. She uses a cane and a CPAP, per MD notes. She is active with [facility] (PT).  Conditions: Past Medical Hx: COPD, A Fibrillation, Hyperthyroidism, CHF, OSA with CPAP, DM Patient was recently admitted/discharged between 05/21/17 to 05/27/17 from [facility]. She reported continued signs and symptoms of feeling weak, which limits her ability to be independent. Prior to being in the hospital, patient was doing yoga 3 times/wk. The PT suggested to patient, she could benefit from having an Therapist, sports, per patient. Patient has a history of COPD and CHF. MD notes stated, patient may not be weighing herself accurately related to malfunction of home scale. She was drinking greater than 2 L of fluids. Her fluid intake possibly caused patient to gain 5 pounds before being admitted to the hospital, per documentation. Patient stated, she could benefit from assistance with managing her CHF and COPD.   Medications: The patient verbalized taking more than 12 medications per day. Patient reported having hardship with paying for her Eliquis. She has to call her PCP to request free samples. MD notes stated patient is having difficulty with purchasing her inhalers. She is taking her medications as prescribed. The patient had no questions regarding her medications.   Appointments: Patient reported, her most recent appointment with her PCP was on 06/01/17. On 06/02/17, she had an appointment with her Pulmonologist. Next  appointment with Pulmonologist will be scheduled within 4 weeks.   Advanced Directives: Patient verbalized having a living will and HPOA.  Consent: [facility] services reviewed and discussed with patient. Verbal consent for services given.    Plan: RN CM will send referral to [facility] RN for further in home eval/assessment of care needs and management of chronic conditions. RN CM will send [place] referral for medication management RN CM advised patient to contact RNCM for any needs or concerns. RN CM advised patient to alert MD for any changes in conditions.  RN CM provided patient with [facility] 24hr Nurse Line contact info.  Lake Bells, RN, BSN, MHA/MSL, [place] Telephonic Care Manager Coordinator [facility] Direct Phone: [phone] Toll Free: [phone] Fax: [phone]

## 2017-06-08 ENCOUNTER — Telehealth: Payer: Self-pay | Admitting: Pharmacist

## 2017-06-08 DIAGNOSIS — E785 Hyperlipidemia, unspecified: Secondary | ICD-10-CM | POA: Diagnosis not present

## 2017-06-08 DIAGNOSIS — I5032 Chronic diastolic (congestive) heart failure: Secondary | ICD-10-CM | POA: Diagnosis not present

## 2017-06-08 DIAGNOSIS — Z7902 Long term (current) use of antithrombotics/antiplatelets: Secondary | ICD-10-CM | POA: Diagnosis not present

## 2017-06-08 DIAGNOSIS — I110 Hypertensive heart disease with heart failure: Secondary | ICD-10-CM | POA: Diagnosis not present

## 2017-06-08 DIAGNOSIS — M810 Age-related osteoporosis without current pathological fracture: Secondary | ICD-10-CM | POA: Diagnosis not present

## 2017-06-08 DIAGNOSIS — Z794 Long term (current) use of insulin: Secondary | ICD-10-CM | POA: Diagnosis not present

## 2017-06-08 DIAGNOSIS — I2510 Atherosclerotic heart disease of native coronary artery without angina pectoris: Secondary | ICD-10-CM | POA: Diagnosis not present

## 2017-06-08 DIAGNOSIS — D509 Iron deficiency anemia, unspecified: Secondary | ICD-10-CM | POA: Diagnosis not present

## 2017-06-08 DIAGNOSIS — M159 Polyosteoarthritis, unspecified: Secondary | ICD-10-CM | POA: Diagnosis not present

## 2017-06-08 DIAGNOSIS — I252 Old myocardial infarction: Secondary | ICD-10-CM | POA: Diagnosis not present

## 2017-06-08 DIAGNOSIS — E119 Type 2 diabetes mellitus without complications: Secondary | ICD-10-CM | POA: Diagnosis not present

## 2017-06-08 DIAGNOSIS — I4891 Unspecified atrial fibrillation: Secondary | ICD-10-CM | POA: Diagnosis not present

## 2017-06-08 DIAGNOSIS — G4733 Obstructive sleep apnea (adult) (pediatric): Secondary | ICD-10-CM | POA: Diagnosis not present

## 2017-06-08 DIAGNOSIS — J449 Chronic obstructive pulmonary disease, unspecified: Secondary | ICD-10-CM | POA: Diagnosis not present

## 2017-06-08 NOTE — Patient Outreach (Signed)
[facility]) Care Management  06/08/2017  ESTELLE GREENLEAF 1931-03-06 [id number]   Patient was called per referral from [place] for medication assistance and review.  Unfortunately, the patient did not answer the phone.  HIPAA compliant message left on the patient's voicemail.  Plan:  Call the patient back within 3-5 business days.   Elayne Guerin, PharmD, [place] Clinical Pharmacist [phone]

## 2017-06-08 NOTE — Telephone Encounter (Signed)
-----   Message from Jiles Harold sent at 06/07/2017  1:14 PM EDT ----- Regarding: Referral: Order for Lynwood Dawley  Referral from Lake Bells, RN  "Please see below request"  Forde Radon ----- Message ----- From: Gabriel Cirri, RN Sent: 06/07/2017  12:05 PM To: Thn Cm Communication Orders Subject: Order for [name], [name]                     Patient Name: [name], [name]([id number]) Sex: Female DOB: [birth year]    PCP: BURNS, [place]: Rande Lawman   Types of orders made on 06/07/2017: Nursing  Order Date:06/07/2017 Ordering User:FUTRELL, Ree Kida [[id number]] Encounter Provider:Futrell, Ree Kida, RN [phone] Authorizing Provider: [name], MD [phone] Department:[facility][[id number]]  Order Specific Information Order: Comm to Pharmacy [Custom: [id number]]  Order #: [id number] Qty: 1   Priority: Routine  Class: Clinic Performed   Comment:Referral from: [facility] High Risk            Polypharmacy med review-patient taking greater than 12 meds.            Medication assistance. Patient unable to afford meds.            Referral made to [place].                        Lake Bells, RN, BSN, MHA/MSL, [place] Telephonic Care Manager Coordinator            [facility]            Direct Phone: [phone]            Toll Free: [phone]            Fax: [phone]   Disposition: Return : Charity fundraiser and Pharmacy will contact patient..   Priority: Routine  Class: Clinic Performed   Comment:Referral from: [facility] High Risk            Polypharmacy med review-patient taking greater than 12 meds.            Medication assistance. Patient unable to afford meds.            Referral made to [place].                        Lake Bells, RN, BSN, MHA/MSL, [place] Telephonic Care Manager Coordinator            [facility]            Direct Phone: [phone]          Toll Free: [phone]            Fax: [phone]

## 2017-06-09 ENCOUNTER — Other Ambulatory Visit: Payer: Self-pay | Admitting: *Deleted

## 2017-06-09 NOTE — Patient Outreach (Signed)
[facility]) Care Management  06/09/2017  [name] June 13, 1931 [id number]   Referral received 6/12.  RN attempted outreach call today however unsuccessful. RN able to leave a HIPAA approved voice mail requesting a call back. Will introduce [facility] and initiate services at that time if pt remains receptive to the available services. Will continue follow up calls accordingly.  Raina Mina, RN Care Management Coordinator [place] Office [phone]

## 2017-06-13 ENCOUNTER — Telehealth: Payer: Self-pay | Admitting: *Deleted

## 2017-06-13 ENCOUNTER — Telehealth: Payer: Self-pay | Admitting: Internal Medicine

## 2017-06-13 ENCOUNTER — Other Ambulatory Visit: Payer: Self-pay | Admitting: *Deleted

## 2017-06-13 DIAGNOSIS — I110 Hypertensive heart disease with heart failure: Secondary | ICD-10-CM | POA: Diagnosis not present

## 2017-06-13 DIAGNOSIS — Z794 Long term (current) use of insulin: Secondary | ICD-10-CM | POA: Diagnosis not present

## 2017-06-13 DIAGNOSIS — I2510 Atherosclerotic heart disease of native coronary artery without angina pectoris: Secondary | ICD-10-CM | POA: Diagnosis not present

## 2017-06-13 DIAGNOSIS — J449 Chronic obstructive pulmonary disease, unspecified: Secondary | ICD-10-CM | POA: Diagnosis not present

## 2017-06-13 DIAGNOSIS — E785 Hyperlipidemia, unspecified: Secondary | ICD-10-CM | POA: Diagnosis not present

## 2017-06-13 DIAGNOSIS — M810 Age-related osteoporosis without current pathological fracture: Secondary | ICD-10-CM | POA: Diagnosis not present

## 2017-06-13 DIAGNOSIS — I5032 Chronic diastolic (congestive) heart failure: Secondary | ICD-10-CM | POA: Diagnosis not present

## 2017-06-13 DIAGNOSIS — I252 Old myocardial infarction: Secondary | ICD-10-CM | POA: Diagnosis not present

## 2017-06-13 DIAGNOSIS — Z7902 Long term (current) use of antithrombotics/antiplatelets: Secondary | ICD-10-CM | POA: Diagnosis not present

## 2017-06-13 DIAGNOSIS — I4891 Unspecified atrial fibrillation: Secondary | ICD-10-CM | POA: Diagnosis not present

## 2017-06-13 DIAGNOSIS — G4733 Obstructive sleep apnea (adult) (pediatric): Secondary | ICD-10-CM | POA: Diagnosis not present

## 2017-06-13 DIAGNOSIS — M159 Polyosteoarthritis, unspecified: Secondary | ICD-10-CM | POA: Diagnosis not present

## 2017-06-13 DIAGNOSIS — E119 Type 2 diabetes mellitus without complications: Secondary | ICD-10-CM | POA: Diagnosis not present

## 2017-06-13 DIAGNOSIS — D509 Iron deficiency anemia, unspecified: Secondary | ICD-10-CM | POA: Diagnosis not present

## 2017-06-13 NOTE — Telephone Encounter (Signed)
should continue if it it is still helping.

## 2017-06-13 NOTE — Patient Outreach (Signed)
[facility]) Care Management  06/13/2017  [name] 06-11-1931 [id number]   Pt returned a call on Friday and left a message. RN attempted another outreach call today however only able to leave a HIPAA approved voice message requesting another call back. RN left available time and days for pt's convenience. Will continue outreach calls accordingly for possible [facility] involvement.    Raina Mina, RN Care Management Coordinator [place] Office [phone]

## 2017-06-13 NOTE — Telephone Encounter (Signed)
Patient report chronic lower back pain and L leg pain..  Patient is doing well. Would do will with more PT.  1 x 2 weeks Starting July 2  Jim - [phone]

## 2017-06-13 NOTE — Telephone Encounter (Signed)
Ptleft msg on triage stating she has been taking the Gabapentin, and needing to know does she need to continue taking because she is almost out. If so would need rx sent to [facility] rx Pls advise....[name]

## 2017-06-13 NOTE — Telephone Encounter (Signed)
Notified pt w/MD response. She states when she will contact [facility] when she is ready for med to be refill...[name]

## 2017-06-14 ENCOUNTER — Telehealth (HOSPITAL_COMMUNITY): Payer: Self-pay

## 2017-06-14 ENCOUNTER — Other Ambulatory Visit: Payer: Self-pay | Admitting: Pharmacist

## 2017-06-14 DIAGNOSIS — I5032 Chronic diastolic (congestive) heart failure: Secondary | ICD-10-CM | POA: Diagnosis not present

## 2017-06-14 DIAGNOSIS — I252 Old myocardial infarction: Secondary | ICD-10-CM | POA: Diagnosis not present

## 2017-06-14 DIAGNOSIS — I110 Hypertensive heart disease with heart failure: Secondary | ICD-10-CM | POA: Diagnosis not present

## 2017-06-14 DIAGNOSIS — J449 Chronic obstructive pulmonary disease, unspecified: Secondary | ICD-10-CM | POA: Diagnosis not present

## 2017-06-14 DIAGNOSIS — D509 Iron deficiency anemia, unspecified: Secondary | ICD-10-CM | POA: Diagnosis not present

## 2017-06-14 DIAGNOSIS — I4891 Unspecified atrial fibrillation: Secondary | ICD-10-CM | POA: Diagnosis not present

## 2017-06-14 DIAGNOSIS — M810 Age-related osteoporosis without current pathological fracture: Secondary | ICD-10-CM | POA: Diagnosis not present

## 2017-06-14 DIAGNOSIS — Z7902 Long term (current) use of antithrombotics/antiplatelets: Secondary | ICD-10-CM | POA: Diagnosis not present

## 2017-06-14 DIAGNOSIS — G4733 Obstructive sleep apnea (adult) (pediatric): Secondary | ICD-10-CM | POA: Diagnosis not present

## 2017-06-14 DIAGNOSIS — E785 Hyperlipidemia, unspecified: Secondary | ICD-10-CM | POA: Diagnosis not present

## 2017-06-14 DIAGNOSIS — I2510 Atherosclerotic heart disease of native coronary artery without angina pectoris: Secondary | ICD-10-CM | POA: Diagnosis not present

## 2017-06-14 DIAGNOSIS — E119 Type 2 diabetes mellitus without complications: Secondary | ICD-10-CM | POA: Diagnosis not present

## 2017-06-14 DIAGNOSIS — M159 Polyosteoarthritis, unspecified: Secondary | ICD-10-CM | POA: Diagnosis not present

## 2017-06-14 DIAGNOSIS — Z794 Long term (current) use of insulin: Secondary | ICD-10-CM | POA: Diagnosis not present

## 2017-06-14 NOTE — Telephone Encounter (Signed)
Patient has met all goals of [facility] skilled nursing and has been [place] from services.  [name], RN

## 2017-06-14 NOTE — Patient Outreach (Signed)
[facility]) Care Management  06/14/2017  [name] February 14, 1931 [id number]   Patient was called regarding medication management and assistance per referral from [place], Lake Bells.  Patient called me back earlier today from when I called her last week.  She was already on my schedule today for call back.  Unfortunately she did not answer the phone.  HIPAA compliant message left on patient's voicemail.   Elayne Guerin, PharmD, [place] Clinical Pharmacist [phone]

## 2017-06-14 NOTE — Telephone Encounter (Signed)
LVM for [name] giving verbal orders per MD.

## 2017-06-15 DIAGNOSIS — E785 Hyperlipidemia, unspecified: Secondary | ICD-10-CM | POA: Diagnosis not present

## 2017-06-15 DIAGNOSIS — G4733 Obstructive sleep apnea (adult) (pediatric): Secondary | ICD-10-CM | POA: Diagnosis not present

## 2017-06-15 DIAGNOSIS — Z794 Long term (current) use of insulin: Secondary | ICD-10-CM | POA: Diagnosis not present

## 2017-06-15 DIAGNOSIS — I110 Hypertensive heart disease with heart failure: Secondary | ICD-10-CM | POA: Diagnosis not present

## 2017-06-15 DIAGNOSIS — I4891 Unspecified atrial fibrillation: Secondary | ICD-10-CM | POA: Diagnosis not present

## 2017-06-15 DIAGNOSIS — J449 Chronic obstructive pulmonary disease, unspecified: Secondary | ICD-10-CM | POA: Diagnosis not present

## 2017-06-15 DIAGNOSIS — M810 Age-related osteoporosis without current pathological fracture: Secondary | ICD-10-CM | POA: Diagnosis not present

## 2017-06-15 DIAGNOSIS — M159 Polyosteoarthritis, unspecified: Secondary | ICD-10-CM | POA: Diagnosis not present

## 2017-06-15 DIAGNOSIS — Z7902 Long term (current) use of antithrombotics/antiplatelets: Secondary | ICD-10-CM | POA: Diagnosis not present

## 2017-06-15 DIAGNOSIS — I5032 Chronic diastolic (congestive) heart failure: Secondary | ICD-10-CM | POA: Diagnosis not present

## 2017-06-15 DIAGNOSIS — I252 Old myocardial infarction: Secondary | ICD-10-CM | POA: Diagnosis not present

## 2017-06-15 DIAGNOSIS — E119 Type 2 diabetes mellitus without complications: Secondary | ICD-10-CM | POA: Diagnosis not present

## 2017-06-15 DIAGNOSIS — D509 Iron deficiency anemia, unspecified: Secondary | ICD-10-CM | POA: Diagnosis not present

## 2017-06-15 DIAGNOSIS — I2510 Atherosclerotic heart disease of native coronary artery without angina pectoris: Secondary | ICD-10-CM | POA: Diagnosis not present

## 2017-06-16 ENCOUNTER — Other Ambulatory Visit: Payer: Self-pay | Admitting: Internal Medicine

## 2017-06-16 NOTE — Telephone Encounter (Signed)
filled

## 2017-06-16 NOTE — Telephone Encounter (Signed)
Please advise, I do not think this had been filled since pt has been released from hospital.

## 2017-06-17 ENCOUNTER — Other Ambulatory Visit: Payer: Self-pay | Admitting: *Deleted

## 2017-06-17 ENCOUNTER — Ambulatory Visit: Payer: Medicare Other | Admitting: *Deleted

## 2017-06-17 ENCOUNTER — Encounter: Payer: Self-pay | Admitting: *Deleted

## 2017-06-17 ENCOUNTER — Telehealth: Payer: Self-pay | Admitting: Pharmacist

## 2017-06-17 ENCOUNTER — Encounter: Payer: Self-pay | Admitting: Pharmacist

## 2017-06-17 NOTE — Patient Outreach (Signed)
[facility]) Care Management  [name]   06/17/2017  [name] 05-18-1931 [id number]  Subjective: Received a call from [name], Raina [name] stating she spoke with patient today and the patient had some urgent medication assistance needs. I had previously not been able to reach the patient but Lattie Haw said patient would be available this afternoon.  Patient was called.  HIPAA identifiers were obtained.  Patient is an 83 year old female with multiple medical conditions including but not limited to:  Hypertension, COPD, PVD, GERD< depression, hyperlipidemia, osteoporosis and atrial flutter.    Patient reported managing her medications on her own and uses a pill box.  Patient also reported difficulty affording Eliquis and her inhalers.  Patient said Dr. Gustavus Bryant office gave her some Dulera and Symbicort samples of which she has about two months worth of therapy.  She has been getting sample of Eliquis from her Cardiologist but only has about nine days of therapy left.  She has an appointment with cardiology 06/27/17.   Objective:   Encounter Medications: Outpatient Encounter Prescriptions as of 06/17/2017  Medication Sig Note  . acetaminophen (TYLENOL) 500 MG tablet Take 1,000 mg by mouth every 6 (six) hours as needed (pain).   Marland Kitchen apixaban (ELIQUIS) 5 MG TABS tablet Take 1 tablet (5 mg total) by mouth 2 (two) times daily.   . budesonide-formoterol (SYMBICORT) 160-4.5 MCG/ACT inhaler Inhale 2 puffs into the lungs 2 (two) times daily.   Marland Kitchen CALCIUM-MAG-VIT C-VIT D PO Take 1 tablet by mouth daily.   . carvedilol (COREG) 12.5 MG tablet Take 1 tablet (12.5 mg total) by mouth 2 (two) times daily with a meal.   . cholecalciferol (VITAMIN D) 1000 units tablet Take 1,000 Units by mouth daily after supper.   . clotrimazole (LOTRIMIN) 1 % cream Apply 1 application topically 2 (two) times daily. To inner thighs (Patient taking differently: Apply 1 application topically 2  (two) times daily as needed (rash). To inner thighs)   . diclofenac sodium (VOLTAREN) 1 % GEL Apply 1 application topically 2 (two) times daily as needed (knee pain).    . furosemide (LASIX) 40 MG tablet Take 2 tablets (80 mg total) by mouth daily.   Marland Kitchen gabapentin (NEURONTIN) 100 MG capsule TAKE 1 CAPSULE BY MOUTH AT  BEDTIME. IF TOLERATED AFTER TWO DAYS INCREASE TO 200 MG NIGHTLY (Patient taking differently: 3 capsules at bedtime)   . insulin NPH Human (HUMULIN N,NOVOLIN N) 100 UNIT/ML injection Inject 0.42 mLs (42 Units total) into the skin daily. (Patient taking differently: Inject 40 Units into the skin daily before breakfast. )   . Lancets (ONETOUCH ULTRASOFT) lancets Use to check blood sugar 2 times per day. Dx Code E11.9   . methimazole (TAPAZOLE) 5 MG tablet Take 1 tablet (5 mg total) by mouth 2 (two) times a week. Wednesday and Friday. (Patient taking differently: Take 5 mg by mouth 2 (two) times a week. Monday and Friday)   . mometasone-formoterol (DULERA) 100-5 MCG/ACT AERO Inhale 2 puffs into the lungs 2 (two) times daily. 06/17/2017: Patient was given samples of both Dulera and Symbicort  . Multiple Vitamin (MULTIVITAMIN WITH MINERALS) TABS tablet Take 1 tablet by mouth daily. Centrum Silver   . nitroGLYCERIN (NITROSTAT) 0.4 MG SL tablet Place 0.4 mg under the tongue every 5 (five) minutes as needed for chest pain. Reported on 01/28/2016   . nystatin (MYCOSTATIN) 100000 UNIT/ML suspension Take 5 mLs (500,000 Units total) by mouth 4 (four) times  daily. Take for 10 days 06/17/2017: Has 2 more days of therapy  . potassium chloride SA (K-DUR,KLOR-CON) 20 MEQ tablet Take 1 tablet (6mq) daily every other day alternating with 2 tablets (47m) daily every other day (Patient taking differently: Take 20-40 mEq by mouth See admin instructions. Take 1 tablet (2029m daily every other day alternating with 2 tablets (71m78mdaily every other day) 05/21/2017: 1 tablet today  . Turmeric 500 MG CAPS Take 500 mg  by mouth daily after supper.    . [DISCONTINUED] gabapentin (NEURONTIN) 100 MG capsule Take 300 mg by mouth at bedtime.   . [DISCONTINUED] Multiple Vitamins-Minerals (CENTRUM SILVER 50+WOMEN PO) Take 1 tablet by mouth daily.   . alMarland Kitchenuterol (PROVENTIL HFA;VENTOLIN HFA) 108 (90 Base) MCG/ACT inhaler Inhale 2 puffs into the lungs every 6 (six) hours as needed for wheezing or shortness of breath. (Patient not taking: Reported on 06/17/2017)   . [DISCONTINUED] Dextromethorphan-Guaifenesin 20-200 MG/10ML SOLN Take 10 mLs by mouth 4 (four) times daily as needed (cough/congestion).    No facility-administered encounter medications on file as of 06/17/2017.     Functional Status: In your present state of health, do you have any difficulty performing the following activities: 05/22/2017 02/07/2017  Hearing? N N  Vision? N N  Difficulty concentrating or making decisions? N N  Walking or climbing stairs? Y Y  Dressing or bathing? N N  Doing errands, shopping? Y N  Preparing Food and eating ? - N  Using the Toilet? - N  In the past six months, have you accidently leaked urine? - Y  Do you have problems with loss of bowel control? - N  Managing your Medications? - N  Managing your Finances? - N  Housekeeping or managing your Housekeeping? - N  Some recent data might be hidden    Fall/Depression Screening: Fall Risk  06/06/2017 02/07/2017 01/27/2017  Falls in the past year? No Yes Yes  Number falls in past yr: - - 1  Injury with Fall? - No No  Risk for fall due to : History of fall(s) Impaired balance/gait;Impaired mobility;Impaired vision -  Risk for fall due to (comments): - Fall prevention education provided  -   PHQ [facility] Scores 06/06/2017 02/07/2017 01/27/2017 08/22/2014 07/19/2013  PHQ - 2 Score 0 0 0 0 0      Assessment: Medication Assistance Findings:  -patient is over income for "extra help program" offer through [employer] has [employer] 2  -patient has a $170  deductible and she has met $29.46 towards her deductible -patient receives her generic tier 1&2 medications from [facility] at no cost   -patient appears to qualify to receive Dulera from [employer] assistance program -patient has NOT met the 3% of household income prescription spend requirement for the Eliquis patient assistance program -patient has NOT met the $600 prescription spend requirement for the Symbicort patient assistance program  -[facility] could be used to purchase Eliquis one time to pay the patient's deductible. After then, the patient's copay for Eliquis would be $45 for a 30 day supply.  However, once the patient hit the donut hole, the price of all of her medications would increase.  Those she had been getting at $0 copay would then range in price from:  $4-$28 The Eliquis would then cost $125 for a 30 day supply or around $430 for a 90 day supply.  -through 05/25/17, patient out-of-pocket spend was $220.95  Plan: Home visit scheduled  for 06/22/17 at 12 noon to complete patient assistance paperwork for [facility]  Patient will continue to get sample of Eliquis from her provider as long as she can   Elayne Guerin, PharmD, [place] Clinical Pharmacist [phone]

## 2017-06-17 NOTE — Patient Outreach (Signed)
[facility]) Care Management  06/17/2017  [name] 09-24-1931 [id number]  RN 3rd outreach attempt for pending [facility] services. RN able to speak with pt today and introduced the [facility] program and services. Discussed the purpose for today's call and further discussed pt's current disposition with other involved services since her discharge from the hospital. Pt reports she is doing "extremely well" with a live in supportive son and daughter that lives nearby. Pt currently involved with [facility] for PT and RN. Pt also mentioned a pending appointment with the [facility] wellness nurse on 7/9. RN discussed pt's medical issues and possible services and/or programs that pt can be further educated on for increased knowledge and proactive interventions with acute encountered issues. Pt states she has spoken with her CAD provider and currently has a regimen with any symptoms that may occur with her HF. Pt is aware of the HF zones and has been informed if she gains 3 lbs to take double the Lasix dosage for a few days. Pt also aware to look for any swelling to her ankles and report any increased SOB related to HF signs. Pt indicates she is very aware and feels she is able to manage this if it occurs. RN offered any community resources however pt feels again she has enough resources with the support team already involved. Pt states at the time the only issues she has is with one of her medication Eliquis. RN verified that [facility] pharmacy) has attempted to contact pt and currently pending another outreach. RN offered to to notify this pharmacy of pt's concerning with this medication and request a call back today if possible. Pt very appreciative and grateful for all the attention and services that she has received. Again RN offered a home visit for community engagement but pt has opt to decline this services again extended her gratitude. RN provided pt with the main [facility] office and this RN  case manager's number if needed in the further for any community resources or services in the future.   Received a call back from pharmacy who will outreach to pt today for [place] needs. Will notify primary provider that pt decline community case management for nursing however will remain active with pharmacy.  Raina Mina, RN Care Management Coordinator [place] Office [phone]

## 2017-06-22 ENCOUNTER — Other Ambulatory Visit: Payer: Self-pay | Admitting: Pharmacist

## 2017-06-23 DIAGNOSIS — E785 Hyperlipidemia, unspecified: Secondary | ICD-10-CM | POA: Diagnosis not present

## 2017-06-23 DIAGNOSIS — I110 Hypertensive heart disease with heart failure: Secondary | ICD-10-CM | POA: Diagnosis not present

## 2017-06-23 DIAGNOSIS — I2510 Atherosclerotic heart disease of native coronary artery without angina pectoris: Secondary | ICD-10-CM | POA: Diagnosis not present

## 2017-06-23 DIAGNOSIS — I252 Old myocardial infarction: Secondary | ICD-10-CM | POA: Diagnosis not present

## 2017-06-23 DIAGNOSIS — E119 Type 2 diabetes mellitus without complications: Secondary | ICD-10-CM | POA: Diagnosis not present

## 2017-06-23 DIAGNOSIS — G4733 Obstructive sleep apnea (adult) (pediatric): Secondary | ICD-10-CM | POA: Diagnosis not present

## 2017-06-23 DIAGNOSIS — D509 Iron deficiency anemia, unspecified: Secondary | ICD-10-CM | POA: Diagnosis not present

## 2017-06-23 DIAGNOSIS — I5032 Chronic diastolic (congestive) heart failure: Secondary | ICD-10-CM | POA: Diagnosis not present

## 2017-06-23 DIAGNOSIS — M810 Age-related osteoporosis without current pathological fracture: Secondary | ICD-10-CM | POA: Diagnosis not present

## 2017-06-23 DIAGNOSIS — I4891 Unspecified atrial fibrillation: Secondary | ICD-10-CM | POA: Diagnosis not present

## 2017-06-23 DIAGNOSIS — J449 Chronic obstructive pulmonary disease, unspecified: Secondary | ICD-10-CM | POA: Diagnosis not present

## 2017-06-23 DIAGNOSIS — M159 Polyosteoarthritis, unspecified: Secondary | ICD-10-CM | POA: Diagnosis not present

## 2017-06-23 NOTE — Patient Outreach (Signed)
[facility]) Care Management  [place]   06/23/2017  [name] 1931-06-20 [id number]  Subjective: Home visit was completed today at patient's home.  HIPAA identifiers were obtained. Patient is an 83 year old female with multiple medical conditions including but not limited to:  Hypertension, COPD, PVD, GERD,  depression, hyperlipidemia, chronic knee pain,  osteoporosis and atrial flutter.  The purpose of the visit was to completed patient assistance paper work for [employer].    Objective:   Encounter Medications: Outpatient Encounter Prescriptions as of 06/22/2017  Medication Sig Note  . acetaminophen (TYLENOL) 500 MG tablet Take 1,000 mg by mouth every 6 (six) hours as needed (pain).   Marland Kitchen apixaban (ELIQUIS) 5 MG TABS tablet Take 1 tablet (5 mg total) by mouth 2 (two) times daily.   Marland Kitchen CALCIUM-MAG-VIT C-VIT D PO Take 1 tablet by mouth daily.   . carvedilol (COREG) 12.5 MG tablet Take 1 tablet (12.5 mg total) by mouth 2 (two) times daily with a meal.   . cholecalciferol (VITAMIN D) 1000 units tablet Take 1,000 Units by mouth daily after supper.   . clotrimazole (LOTRIMIN) 1 % cream Apply 1 application topically 2 (two) times daily. To inner thighs (Patient taking differently: Apply 1 application topically 2 (two) times daily as needed (rash). To inner thighs)   . furosemide (LASIX) 40 MG tablet Take 2 tablets (80 mg total) by mouth daily.   Marland Kitchen gabapentin (NEURONTIN) 100 MG capsule TAKE 1 CAPSULE BY MOUTH AT  BEDTIME. IF TOLERATED AFTER TWO DAYS INCREASE TO 200 MG NIGHTLY (Patient taking differently: 3 capsules at bedtime)   . Lancets (ONETOUCH ULTRASOFT) lancets Use to check blood sugar 2 times per day. Dx Code E11.9   . methimazole (TAPAZOLE) 5 MG tablet Take 1 tablet (5 mg total) by mouth 2 (two) times a week. Wednesday and Friday. (Patient taking differently: Take 5 mg by mouth 2 (two) times a week. Monday and Friday)   . mometasone-formoterol (DULERA) 100-5  MCG/ACT AERO Inhale 2 puffs into the lungs 2 (two) times daily. 06/17/2017: Patient was given samples of both Dulera and Symbicort  . Multiple Vitamin (MULTIVITAMIN WITH MINERALS) TABS tablet Take 1 tablet by mouth daily. Centrum Silver   . nitroGLYCERIN (NITROSTAT) 0.4 MG SL tablet Place 0.4 mg under the tongue every 5 (five) minutes as needed for chest pain. Reported on 01/28/2016   . nystatin (MYCOSTATIN) 100000 UNIT/ML suspension Take 5 mLs (500,000 Units total) by mouth 4 (four) times daily. Take for 10 days 06/17/2017: Has 2 more days of therapy  . potassium chloride SA (K-DUR,KLOR-CON) 20 MEQ tablet Take 1 tablet (43mEq) daily every other day alternating with 2 tablets (55mEq) daily every other day (Patient taking differently: Take 20-40 mEq by mouth See admin instructions. Take 1 tablet (64mEq) daily every other day alternating with 2 tablets (59mEq) daily every other day) 05/21/2017: 1 tablet today  . Turmeric 500 MG CAPS Take 500 mg by mouth daily after supper.    Marland Kitchen albuterol (PROVENTIL HFA;VENTOLIN HFA) 108 (90 Base) MCG/ACT inhaler Inhale 2 puffs into the lungs every 6 (six) hours as needed for wheezing or shortness of breath. (Patient not taking: Reported on 06/17/2017)   . budesonide-formoterol (SYMBICORT) 160-4.5 MCG/ACT inhaler Inhale 2 puffs into the lungs 2 (two) times daily.   . diclofenac sodium (VOLTAREN) 1 % GEL Apply 1 application topically 2 (two) times daily as needed (knee pain).    . insulin NPH Human (HUMULIN N,NOVOLIN N) 100  UNIT/ML injection Inject 0.42 mLs (42 Units total) into the skin daily. (Patient taking differently: Inject 40 Units into the skin daily before breakfast. )    No facility-administered encounter medications on file as of 06/22/2017.     Functional Status: In your present state of health, do you have any difficulty performing the following activities: 05/22/2017 02/07/2017  Hearing? N N  Vision? N N  Difficulty concentrating or making decisions? N N  Walking  or climbing stairs? Y Y  Dressing or bathing? N N  Doing errands, shopping? Y N  Preparing Food and eating ? - N  Using the Toilet? - N  In the past six months, have you accidently leaked urine? - Y  Do you have problems with loss of bowel control? - N  Managing your Medications? - N  Managing your Finances? - N  Housekeeping or managing your Housekeeping? - N  Some recent data might be hidden    Fall/Depression Screening: Fall Risk  06/06/2017 02/07/2017 01/27/2017  Falls in the past year? No Yes Yes  Number falls in past yr: - - 1  Injury with Fall? - No No  Risk for fall due to : History of fall(s) Impaired balance/gait;Impaired mobility;Impaired vision -  Risk for fall due to (comments): - Fall prevention education provided  -   [facility] 2/9 Scores 06/06/2017 02/07/2017 01/27/2017 08/22/2014 07/19/2013  PHQ - 2 Score 0 0 0 0 0      Assessment:  Patient's medications were reviewed.    [employer] Patient Media planner was completed for [employer] and Proventil .  Patient has at least one month supply of Dulera samples.  She does not have a rescue inhaler in her possession.  Plan:  Drop application off at Dr. Agustina Caroli office Follow up with the patient and [employer] in 2 weeks

## 2017-06-24 ENCOUNTER — Ambulatory Visit: Payer: Medicare Other | Admitting: Pharmacist

## 2017-06-27 ENCOUNTER — Ambulatory Visit (HOSPITAL_COMMUNITY)
Admission: RE | Admit: 2017-06-27 | Discharge: 2017-06-27 | Disposition: A | Discharge status: Home / Self Care | Payer: Medicare Other | Source: Ambulatory Visit | Attending: Cardiology | Admitting: Cardiology

## 2017-06-27 ENCOUNTER — Telehealth: Payer: Self-pay | Admitting: Internal Medicine

## 2017-06-27 VITALS — BP 142/62 | HR 70 | Wt 175.4 lb

## 2017-06-27 DIAGNOSIS — Z794 Long term (current) use of insulin: Secondary | ICD-10-CM | POA: Insufficient documentation

## 2017-06-27 DIAGNOSIS — I5042 Chronic combined systolic (congestive) and diastolic (congestive) heart failure: Secondary | ICD-10-CM | POA: Insufficient documentation

## 2017-06-27 DIAGNOSIS — J441 Chronic obstructive pulmonary disease with (acute) exacerbation: Secondary | ICD-10-CM | POA: Diagnosis not present

## 2017-06-27 DIAGNOSIS — G4733 Obstructive sleep apnea (adult) (pediatric): Secondary | ICD-10-CM | POA: Diagnosis not present

## 2017-06-27 DIAGNOSIS — I272 Pulmonary hypertension, unspecified: Secondary | ICD-10-CM | POA: Insufficient documentation

## 2017-06-27 DIAGNOSIS — I110 Hypertensive heart disease with heart failure: Secondary | ICD-10-CM | POA: Diagnosis not present

## 2017-06-27 DIAGNOSIS — I5032 Chronic diastolic (congestive) heart failure: Secondary | ICD-10-CM

## 2017-06-27 DIAGNOSIS — R200 Anesthesia of skin: Secondary | ICD-10-CM

## 2017-06-27 DIAGNOSIS — Z880 Allergy status to penicillin: Secondary | ICD-10-CM | POA: Diagnosis not present

## 2017-06-27 DIAGNOSIS — I2510 Atherosclerotic heart disease of native coronary artery without angina pectoris: Secondary | ICD-10-CM | POA: Insufficient documentation

## 2017-06-27 DIAGNOSIS — I482 Chronic atrial fibrillation, unspecified: Secondary | ICD-10-CM

## 2017-06-27 DIAGNOSIS — I10 Essential (primary) hypertension: Secondary | ICD-10-CM

## 2017-06-27 DIAGNOSIS — Z881 Allergy status to other antibiotic agents status: Secondary | ICD-10-CM | POA: Insufficient documentation

## 2017-06-27 DIAGNOSIS — Z888 Allergy status to other drugs, medicaments and biological substances status: Secondary | ICD-10-CM | POA: Insufficient documentation

## 2017-06-27 DIAGNOSIS — I481 Persistent atrial fibrillation: Secondary | ICD-10-CM | POA: Insufficient documentation

## 2017-06-27 DIAGNOSIS — Z91013 Allergy to seafood: Secondary | ICD-10-CM | POA: Insufficient documentation

## 2017-06-27 DIAGNOSIS — Z7901 Long term (current) use of anticoagulants: Secondary | ICD-10-CM | POA: Insufficient documentation

## 2017-06-27 DIAGNOSIS — I4892 Unspecified atrial flutter: Secondary | ICD-10-CM | POA: Diagnosis not present

## 2017-06-27 DIAGNOSIS — Z885 Allergy status to narcotic agent status: Secondary | ICD-10-CM | POA: Insufficient documentation

## 2017-06-27 LAB — BASIC METABOLIC PANEL
Anion gap: 12 (ref 5–15)
BUN: 19 mg/dL (ref 6–20)
CO2: 26 mmol/L (ref 22–32)
Calcium: 9.8 mg/dL (ref 8.9–10.3)
Chloride: 99 mmol/L — ABNORMAL LOW (ref 101–111)
Creatinine, Ser: 1.02 mg/dL — ABNORMAL HIGH (ref 0.44–1.00)
GFR calc Af Amer: 57 mL/min — ABNORMAL LOW (ref >60)
GFR calc non Af Amer: 49 mL/min — ABNORMAL LOW (ref >60)
Glucose, Bld: 213 mg/dL — ABNORMAL HIGH (ref 65–99)
Potassium: 4 mmol/L (ref 3.5–5.1)
Sodium: 137 mmol/L (ref 135–145)

## 2017-06-27 NOTE — Progress Notes (Signed)
Medication Samples have been provided to the patient.  Drug name: Eliquis      Strength: 5mg         Qty:2 boxes  LOT: [id number] Exp.Date: 10/20  Dosing instructions: Take 1 Tablet Twice Daily  The patient has been instructed regarding the correct time, dose, and frequency of taking this medication, including desired effects and most common side effects.   Kennieth Rad 12:22 PM 06/27/2017

## 2017-06-27 NOTE — Patient Instructions (Signed)
Labs today (will call for abnormal results, otherwise no news is good news)  Follow up in 3 months.  

## 2017-06-27 NOTE — Telephone Encounter (Signed)
Pt called stating that she started with a numb feeling in her pinky fingers. (She has talked to Dr Quay Burow about this) But how it has progressed causing her to not be able to pick up certain things. She wanted to know if a referral could be sent to a hand specialist or someone that could assist her with these issues.

## 2017-06-27 NOTE — Progress Notes (Signed)
Patient ID: [name], female   DOB: Apr 19, 1931, 83 y.o.   MRN: [id number]   ADVANCED HF CLINIC NOTE  Patient ID: [name], female   DOB: [birth year], 83 y.o.   MRN: [id number]  Referring Physician: Lovena Le Primary Care: Billey Gosling Primary Cardiologist:Taylor  HPI: [name] is a pleasant 83 yo woman with CAD, HTN, atrial fibrillation/A flutter and chronic prior systolic heart failure, (which was likely rate related) and diastolic heart failure. She was referred by Dr. Lovena Le for further evaluation of Pulmonary HTN.   Underwent 2D echo in 3/15 which showed EF 60-65% mild LVH. RV was normal. No significant valvular abnormalities.   Underwent R/L cath 05/07/14 Which showed stable CAD and significant PH  RA 6 RV 71/7 6 PA 63/17 (33)  PCWP 19 mm Hg  LV 174/16 mm Hg  AO 171/62 mean 103 mm Hg  PVR = 2.86 Oxygen saturations:  PA 69%  AO 99%  Cardiac Output/Index (Fick) 4.88/2.7    Left mainstem: Normal.  Left anterior descending (LAD): Moderate calcification proximally. Mild irregularities less than 10%. The first diagonal has 40-50% ostial disease.  Left circumflex (LCx): The LCx gives rise to a large OM1 then terminates in the AV groove. There is 20% disease in the proximal LCx. The first OM stent is patent with diffuse 20% disease.  Right coronary artery (RCA): The RCA arises anteriorly. There is an eccentric slit like stenosis in the proximal vessel to 60-70%. The mid vessel has segmental 70-80% disease.  Left ventriculography: Left ventricular systolic function is normal, LVEF is estimated at 55-65%, there is no significant mitral regurgitation   We saw her for an initial visit in May 2105 for pHTN and DOE. At that time she was quite dyspneic with activity. HR was in 40-50 range on carvedilol 50 bid and digoxin. We stopped her digoxin and cut carvedilol back to 25 bid. PFTs, VQ and CXR were ordered. I also asked her to decrease her H2O intake. We saw her back last  month and she was feeling much better. Given results of PFTs overnight oximeter was placed and referred to Dr. Gwenette Greet. She has had overnight oximetry which showed oxygen desaturation as low as 83%, and spent 97 minutes less than 88% during the night.   Admitted 05/21/17-05/27/17 with COPD exacerbation, acute on chronic diastolic CHF. Diuresed with 5L with IV Lasix. Discharge weight 173 pounds.  She returns today for follow up. Overall feeling well, weights at home are 172-174 pounds. She notes that her weight increases mostly on the weekends when she "treats herself". After talking with her she eats many high sodium foods, eats a fair amount of deli meat (pastrami) and cooks with salt. She drinks more than 2L a day, which she attributes to being thirsty frequently. She is working with home health PT and can walk and do strength building exercises as long "as I don't push myself".  She is taking all her medications, she will double her lasix when she notices weight gain but has not done this since she was discharged from the hospital. She denies fever, chills, chest pain, palpitations. Remain on Eliquis, denies melena and hematochezia.   Results:  05/21/14: VQ normal 5/15: CXR: 3/16: Echo 3/16 EF 60-65% RV normal. Mild TR No RVSP measured  5/15: PFTs FEV1  1.4 (95%) FVC    1.89 (110%) FEV/FVC 88% FEF 25-75%  66% DLCO 39%  BP rest: 138/58 BP peak: 182/66 Peak VO2: 8.9 (69.1% predicted peak VO2) VE/VCO2  slope: 34.6 OUES: Peak RER: 1.24 Ventilatory Threshold: 5.8 (50% predicted and 65% measured peak VO2) Peak RR 38 Peak Ventilation: 32.7 VE/MVV: 71%  PETCO2 at peak: 36 O2pulse: 8 (100% predicted O2pulse)       Current Outpatient Prescriptions  Medication Sig Dispense Refill  . acetaminophen (TYLENOL) 500 MG tablet Take 1,000 mg by mouth every 6 (six) hours as needed (pain).    Marland Kitchen apixaban (ELIQUIS) 5 MG TABS tablet Take 1 tablet (5 mg total) by mouth 2 (two) times daily. 180 tablet 3   . CALCIUM-MAG-VIT C-VIT D PO Take 1 tablet by mouth daily.    . carvedilol (COREG) 12.5 MG tablet Take 1 tablet (12.5 mg total) by mouth 2 (two) times daily with a meal. 180 tablet 3  . cholecalciferol (VITAMIN D) 1000 units tablet Take 1,000 Units by mouth daily after supper.    . clotrimazole (LOTRIMIN) 1 % cream Apply 1 application topically 2 (two) times daily. To inner thighs (Patient taking differently: Apply 1 application topically 2 (two) times daily as needed (rash). To inner thighs) 30 g 0  . diclofenac sodium (VOLTAREN) 1 % GEL Apply 1 application topically 2 (two) times daily as needed (knee pain).     . furosemide (LASIX) 40 MG tablet Take 2 tablets (80 mg total) by mouth daily. 180 tablet 3  . gabapentin (NEURONTIN) 100 MG capsule TAKE 1 CAPSULE BY MOUTH AT  BEDTIME. IF TOLERATED AFTER TWO DAYS INCREASE TO 200 MG NIGHTLY (Patient taking differently: 3 capsules at bedtime) 180 capsule 0  . insulin NPH Human (HUMULIN N,NOVOLIN N) 100 UNIT/ML injection Inject 0.42 mLs (42 Units total) into the skin daily. (Patient taking differently: Inject 40 Units into the skin daily before breakfast. ) 20 mL 11  . Lancets (ONETOUCH ULTRASOFT) lancets Use to check blood sugar 2 times per day. Dx Code E11.9 200 each 2  . methimazole (TAPAZOLE) 10 MG tablet Take 10 mg by mouth 3 (three) times daily.    . methimazole (TAPAZOLE) 5 MG tablet Take 5 mg by mouth 2 (two) times a week. Monday and Friday    . mometasone-formoterol (DULERA) 100-5 MCG/ACT AERO Inhale 2 puffs into the lungs 2 (two) times daily. 2 Inhaler 0  . Multiple Vitamin (MULTIVITAMIN WITH MINERALS) TABS tablet Take 1 tablet by mouth daily. Centrum Silver    . potassium chloride SA (K-DUR,KLOR-CON) 20 MEQ tablet Take 1 tablet (41mEq) daily every other day alternating with 2 tablets (71mEq) daily every other day (Patient taking differently: Take 20-40 mEq by mouth See admin instructions. Take 1 tablet (64mEq) daily every other day alternating with  2 tablets (53mEq) daily every other day) 135 tablet 3  . Turmeric 500 MG CAPS Take 500 mg by mouth daily after supper.     . nitroGLYCERIN (NITROSTAT) 0.4 MG SL tablet Place 0.4 mg under the tongue every 5 (five) minutes as needed for chest pain. Reported on 01/28/2016     No current facility-administered medications for this encounter.     Allergies  Allergen Reactions  . Amlodipine Besylate Other (See Comments)    REACTION: tingling in lips & gum edema  . Levaquin [Levofloxacin] Shortness Of Breath and Swelling    angioedema  . Lobster [Shellfish Allergy] Other (See Comments)    angioedema  . Penicillins Rash    Has patient had a PCN reaction causing immediate rash, facial/tongue/throat swelling, SOB or lightheadedness with hypotension: Yes Has patient had a PCN reaction causing severe rash involving mucus membranes  or skin necrosis: No Has patient had a PCN reaction that required hospitalization: No Has patient had a PCN reaction occurring within the last 10 years: No If all of the above answers are "NO", then may proceed with Cephalosporin use.  . Valsartan Other (See Comments)    REACTION: angioedema  . Codeine Other (See Comments)    Mental status changes  . Lipitor [Atorvastatin] Other (See Comments)    weakness  . Oxycodone Other (See Comments)    hallucinations  . Statins     Made her too weak  . Tramadol Hcl Nausea And Vomiting   PHYSICAL EXAM: Vitals:   06/27/17 1159  BP: (!) 142/62  Pulse: 70  SpO2: 95%  Weight: 175 lb 6.4 oz (79.6 kg)   Wt Readings from Last 3 Encounters:  06/27/17 175 lb 6.4 oz (79.6 kg)  06/02/17 173 lb (78.5 kg)  06/01/17 174 lb (78.9 kg)    General: Female, NAD. Walked into clinic without difficulty.  HEENT: normal, atraumatic.  Neck: supple. No JVD.  Carotids 2+ bilat; no bruits. No lymphadenopathy or thryomegaly appreciated. Cor: PMI non displaced, regular rate and rhythm.  No murmurs, rubs or gallops.  Lungs: Clear in all lobes,  normal effort.  Abdomen: Soft, non tender, non distended,  No hepatosplenomegaly. No bruits or masses. Good bowel sounds Extremities: no cyanosis, clubbing, rash. No peripheral edema.  Neuro: Alert and oriented x 3, cranial nerves grossly intact. moves all 4 extremities w/o difficulty. Affect pleasant   ASSESSMENT & PLAN: 1) Chronic diastolic CHF: Echo 08/7988 EF 60-65%, LA severely dilated.  - NYHA III - Euvolemic by exam.  - Continue Lasix 80 mg daily.  - Continue KCl supplementation. BMET today.  - Reinforced dietary restrictions. Discussed low sodium food choices.  - Reinforced fluid restrictions.   2) Persistent atrial fib/flutter    This patients CHA2DS2-VASc Score and unadjusted Ischemic Stroke Rate (% per year) is equal to 7.2 % stroke rate/year from a score of 5 Above score calculated as 2 points each if present [Age > 75, or Stroke/TIA/TE] - Continue Eliquis for anticoagulation - No signs of overt bleeding.   3) OSA - Follows with Dr. Halford Chessman.  - Continue CPAP hs.   4) HTN - Elevate today, she says home BP's are 120/70's.  - I have asked her to take a log of her blood pressures and bring with her at next appointment.   Follow up in 3 months.   Arbutus Leas, NP- C 12:10 PM

## 2017-06-28 ENCOUNTER — Telehealth: Payer: Self-pay | Admitting: Internal Medicine

## 2017-06-28 DIAGNOSIS — Z794 Long term (current) use of insulin: Secondary | ICD-10-CM | POA: Diagnosis not present

## 2017-06-28 DIAGNOSIS — J449 Chronic obstructive pulmonary disease, unspecified: Secondary | ICD-10-CM | POA: Diagnosis not present

## 2017-06-28 DIAGNOSIS — I252 Old myocardial infarction: Secondary | ICD-10-CM | POA: Diagnosis not present

## 2017-06-28 DIAGNOSIS — E785 Hyperlipidemia, unspecified: Secondary | ICD-10-CM | POA: Diagnosis not present

## 2017-06-28 DIAGNOSIS — Z7902 Long term (current) use of antithrombotics/antiplatelets: Secondary | ICD-10-CM | POA: Diagnosis not present

## 2017-06-28 DIAGNOSIS — M810 Age-related osteoporosis without current pathological fracture: Secondary | ICD-10-CM | POA: Diagnosis not present

## 2017-06-28 DIAGNOSIS — I110 Hypertensive heart disease with heart failure: Secondary | ICD-10-CM | POA: Diagnosis not present

## 2017-06-28 DIAGNOSIS — E119 Type 2 diabetes mellitus without complications: Secondary | ICD-10-CM | POA: Diagnosis not present

## 2017-06-28 DIAGNOSIS — D509 Iron deficiency anemia, unspecified: Secondary | ICD-10-CM | POA: Diagnosis not present

## 2017-06-28 DIAGNOSIS — I5032 Chronic diastolic (congestive) heart failure: Secondary | ICD-10-CM | POA: Diagnosis not present

## 2017-06-28 DIAGNOSIS — I2510 Atherosclerotic heart disease of native coronary artery without angina pectoris: Secondary | ICD-10-CM | POA: Diagnosis not present

## 2017-06-28 DIAGNOSIS — M159 Polyosteoarthritis, unspecified: Secondary | ICD-10-CM | POA: Diagnosis not present

## 2017-06-28 DIAGNOSIS — I4891 Unspecified atrial fibrillation: Secondary | ICD-10-CM | POA: Diagnosis not present

## 2017-06-28 DIAGNOSIS — G4733 Obstructive sleep apnea (adult) (pediatric): Secondary | ICD-10-CM | POA: Diagnosis not present

## 2017-06-28 NOTE — Telephone Encounter (Signed)
LVM informing that referral has been placed to ortho. Pt is aware.

## 2017-06-28 NOTE — Telephone Encounter (Signed)
Referral ordered for hand- ortho

## 2017-06-28 NOTE — Telephone Encounter (Signed)
Received forms. Forms have been filled out and signed. Will place in the mail today for patient. Nothing else needed.

## 2017-06-28 NOTE — Telephone Encounter (Signed)
[name] with [facility] called and states. Patient reports chronic numbness in L pinky finger, this weekend it has moved to full L hand. Patient has reducesd strength. Please advise.   [place]

## 2017-06-28 NOTE — Telephone Encounter (Signed)
Spoke with pt to inform.  

## 2017-07-01 ENCOUNTER — Telehealth: Payer: Self-pay | Admitting: Internal Medicine

## 2017-07-01 DIAGNOSIS — I4891 Unspecified atrial fibrillation: Secondary | ICD-10-CM

## 2017-07-01 DIAGNOSIS — J449 Chronic obstructive pulmonary disease, unspecified: Secondary | ICD-10-CM

## 2017-07-01 NOTE — Telephone Encounter (Signed)
-----   Message from [place] sent at 06/30/2017  2:20 PM EDT ----- Please place an order for MSW and we can set this up.  Please write order for MSW for [employer].  Please fax it to [phone].  We can add it to her open case. Thank you! ----- Message ----- From: Cora Daniels D Sent: 06/30/2017   2:01 PM To: Melissa Stenson  Hi Melissa,   Just a question. You guys have been seeing pt for awhile and I have this referral for social services. Pt just looking for some community service in the area to help pay for medication. Is this something you guys can help her with or if you can guide me with where to send this? Thank you  Cecille Rubin

## 2017-07-01 NOTE — Telephone Encounter (Signed)
Generated order faxed to [facility]....[name]

## 2017-07-05 ENCOUNTER — Other Ambulatory Visit: Payer: Self-pay | Admitting: Pharmacist

## 2017-07-05 NOTE — Patient Outreach (Signed)
[facility]) Care Management  07/05/2017  [name] October 27, 1931 [id number]   Called [employer] Patient Assistance Program to follow up on the application mailed by Dr. Gustavus Bryant office on 06/28/17.  Representative from [employer] said they did not have the application yet but with the holiday some things may be delayed. She suggested I call back in 5-7 business days.  Patient was called to let her know where things are in terms of progress. HIPAA identifiers were obtained.  Patient was also educated about the "attestation form" she may receive from [employer] along with the original application and that all of it needs to be sent back to [employer] once signed.  Patient communicated understanding.  Plan:  I will follow up with patient in 10-14 days.   Elayne Guerin, PharmD, [place] Clinical Pharmacist [phone]

## 2017-07-06 ENCOUNTER — Ambulatory Visit (INDEPENDENT_AMBULATORY_CARE_PROVIDER_SITE_OTHER): Payer: Medicare Other | Admitting: Internal Medicine

## 2017-07-06 ENCOUNTER — Encounter: Payer: Self-pay | Admitting: Internal Medicine

## 2017-07-06 VITALS — BP 126/60 | HR 73 | Ht 62.0 in | Wt 177.6 lb

## 2017-07-06 DIAGNOSIS — D509 Iron deficiency anemia, unspecified: Secondary | ICD-10-CM | POA: Diagnosis not present

## 2017-07-06 DIAGNOSIS — J449 Chronic obstructive pulmonary disease, unspecified: Secondary | ICD-10-CM

## 2017-07-06 DIAGNOSIS — G4733 Obstructive sleep apnea (adult) (pediatric): Secondary | ICD-10-CM | POA: Diagnosis not present

## 2017-07-06 DIAGNOSIS — E119 Type 2 diabetes mellitus without complications: Secondary | ICD-10-CM | POA: Diagnosis not present

## 2017-07-06 DIAGNOSIS — E785 Hyperlipidemia, unspecified: Secondary | ICD-10-CM | POA: Diagnosis not present

## 2017-07-06 DIAGNOSIS — M810 Age-related osteoporosis without current pathological fracture: Secondary | ICD-10-CM | POA: Diagnosis not present

## 2017-07-06 DIAGNOSIS — M159 Polyosteoarthritis, unspecified: Secondary | ICD-10-CM | POA: Diagnosis not present

## 2017-07-06 DIAGNOSIS — I4891 Unspecified atrial fibrillation: Secondary | ICD-10-CM | POA: Diagnosis not present

## 2017-07-06 DIAGNOSIS — Z7902 Long term (current) use of antithrombotics/antiplatelets: Secondary | ICD-10-CM | POA: Diagnosis not present

## 2017-07-06 DIAGNOSIS — I2510 Atherosclerotic heart disease of native coronary artery without angina pectoris: Secondary | ICD-10-CM | POA: Diagnosis not present

## 2017-07-06 DIAGNOSIS — I110 Hypertensive heart disease with heart failure: Secondary | ICD-10-CM | POA: Diagnosis not present

## 2017-07-06 DIAGNOSIS — Z794 Long term (current) use of insulin: Secondary | ICD-10-CM | POA: Diagnosis not present

## 2017-07-06 DIAGNOSIS — I5032 Chronic diastolic (congestive) heart failure: Secondary | ICD-10-CM | POA: Diagnosis not present

## 2017-07-06 DIAGNOSIS — I252 Old myocardial infarction: Secondary | ICD-10-CM | POA: Diagnosis not present

## 2017-07-07 ENCOUNTER — Encounter: Payer: Self-pay | Admitting: Internal Medicine

## 2017-07-07 ENCOUNTER — Telehealth (HOSPITAL_COMMUNITY): Payer: Self-pay | Admitting: Pharmacist

## 2017-07-07 NOTE — Patient Instructions (Addendum)
Ok to try taper off dulera and restart if breathing worsens and if so restart immediatedly at 2 puffs every 12 hours   Follow up with Dr Halford Chessman as planned  Re cpap issues

## 2017-07-07 NOTE — Progress Notes (Signed)
Subjective:     Patient ID: [name], female   DOB: [birth year],    MRN: [id number]  HPI  77 yobf quit smoking  1970 with GOLD I criteria for copd as of 04/2014 but with baseline able to work out at [employer] and eliptical x one mile x 40 min s inhalers  then abuptly ill  sob/cough x 2 weeks p onset admitted:   Admit date: 05/21/2017 Discharge date: 05/27/2017  Brief/Interim Summary: 83 year old female with past medical history essential hypertension, chronic A. fib on apixiban, diabetes mellitus type 2 on insulin, COPD not oxygen dependent neck comes in for hypoxia to the ER was found to have multifocal pneumonia.  Discharge Diagnoses:  Principal Problem:   COPD exacerbation ([place]) -   Active Problems:   ATRIAL FIBRILLATION   Hyperthyroidism   Chronic diastolic heart failure (HCC)   OSA on CPAP   Acute on chronic respiratory failure with hypoxia ([facility])   Multifocal pneumonia   Diabetes ([place])  Acute hypoxic respiratory failure due to COPD exacerbation and multifocal pneumonia: Saturations stabilize with oxygen she was started on IV Solu-Medrol and IV antibiotics. It took about 6 days for her saturations to improve, she was started on Spiriva and albuterol, physical therapy evaluated the patient and recommended home health. She was changed to oral steroids so she will continue for 5 additional days and 2 additional days of oral antibiotics. She will follow-up with her primary physician as an outpatient and get PFTs at this point.  Chronic atrial fibrillation: No changes were made to her medication.  Possible Acute on chronic diastolic congestive heart failure She had a 2-D echo on 03/04/2015, that showed no diastolic dysfunction. In the hospital she was essentially volume overload she was treated with IV Lasix she was negative about 5 L. Which is compatible with her weight came down from 83 kg to 78.kg. She'll continue her current home Lasix dose with potassium  supplementation. Next I she will need a 2-D echo as an outpatient.  Essential hypertension: No changes were made her medication.  Diabetes mellitus type 2: No changes were made to her medication she would be a good candidate to start on metformin as an outpatient.  Coronary artery Disease: No changes made to her medication.  Obstructive sleep apnea: Continued C Pap.  Hyperthyroidism: No changes made to her methimazole     06/02/2017 1st [facility] office visit/ [name]  GOLD I COPD now on symb 160/spiriva  Chief Complaint  Patient presents with  . Pulmonary Consult    Referred by hospitalist after having recent "COPD exacerbation" hospitalized from 05/21/17 until 05/27/17.  She states her breathing has improved some, but not back at her normal baseline.  She is coughing with no mucus production.   more than 50% back to baseline but  still lacks energy / can't smell  Doe = MMRC2 = can't walk a nl pace on a flat grade s sob but does fine slow and flat  rec Stop spiriva and continue symbiocort 80(or dulera 100)  Take 2 puffs first thing in am and then another 2 puffs about 12 hours later.  Work on inhaler technique:    07/06/2017  f/u ov/[name] re:  GOLD I COPD/ symb 80 2bid  (or dulera 100 depending on insurance) no saba Chief Complaint  Patient presents with  . Follow-up    Pt states her breathing is doing well  100% back to baseline and now doe = MMRC1 = can walk  nl pace, flat grade, can't hurry or go uphills or steps s sob   More limited by hips/ knees than sob with adls  No obvious day to day or daytime variability or assoc excess/ purulent sputum or mucus plugs or hemoptysis or cp or chest tightness, subjective wheeze or overt sinus or hb symptoms. No unusual exp hx or h/o childhood pna/ asthma or knowledge of premature birth.  Sleeping ok on cpap without nocturnal  or early am exacerbation  of respiratory  c/o's or need for noct saba. Also denies any obvious fluctuation  of symptoms with weather or environmental changes or other aggravating or alleviating factors except as outlined above   Current Medications, Allergies, Complete Past Medical History, Past Surgical History, Family History, and Social History were reviewed in [employer] record.  ROS  The following are not active complaints unless bolded sore throat, dysphagia, dental problems, itching, sneezing,  nasal congestion or excess/ purulent secretions, ear ache,   fever, chills, sweats, unintended wt loss, classically pleuritic or exertional cp,  orthopnea pnd or leg swelling, presyncope, palpitations, abdominal pain, anorexia, nausea, vomiting, diarrhea  or change in bowel or bladder habits, change in stools or urine, dysuria,hematuria,  rash, arthralgias, visual complaints, headache, numbness, weakness or ataxia or problems with walking or coordination,  change in mood/affect or memory.                 Objective:   Physical Exam    amb pleasant bf nad - all smiles   07/07/2017      177   06/02/17 173 lb (78.5 kg)  06/01/17 174 lb (78.9 kg)  05/27/17 173 lb 12.8 oz (78.8 kg)    Vital signs reviewed  - - Note on arrival 02 sats  93% on RA     HEENT: nl dentition, turbinates bilaterally, and oropharynx. Nl external ear canals without cough reflex   NECK :  without JVD/Nodes/TM/ nl carotid upstrokes bilaterally   LUNGS: no acc muscle use,  Nl contour chest which is clear to A and P bilaterally without cough on insp or exp maneuvers   CV:  RRR  no s3 or murmur or increase in P2, and no edema   ABD:  soft and nontender with nl inspiratory excursion in the supine position. No bruits or organomegaly appreciated, bowel sounds nl  MS:  Nl gait/ ext warm without deformities, calf tenderness, cyanosis or clubbing No obvious joint restrictions   SKIN: warm and dry without lesions    NEURO:  alert, approp, nl sensorium with  no motor or cerebellar deficits apparent.        .   CXR PA and Lateral:   06/02/2017 :    I personally reviewed images and agree with radiology impression as follows:    The lungs are well-expanded. There is no focal infiltrate the interstitial markings are mildly prominent but stable. There is no pleural effusion. The heart and pulmonary vascularity are normal. There is calcification in the wall of the aortic arch. There is gentle levocurvature centered at the thoracolumbar junction.     Assessment:

## 2017-07-07 NOTE — Telephone Encounter (Signed)
Ms. Leech called stating that her Eliquis is >$200 for a 1 month supply through her Medicare Part D plan. I have explained to her that the [employer] patient assistance program requires her to have spent 3% of her annual income on her Rx medications for the calendar year before she is eligible for assistance through the rest of the year. She states that most of her tier 1 medications through [facility] mail order pharmacy are no cost to her so she has definitely not met the 3% requirement. We can give her samples of Eliquis through our clinic as a short term solution but cannot legally continue to provide them on a long term basis. I have also advised her to call the Medicare and ask to apply for "extra help" or the low income subsidy which, if eligible, should bring the cost of her medications down. She was grateful for the advice and will keep me updated on the LIS application.   Ruta Hinds. Velva Harman, PharmD, BCPS, CPP Clinical Pharmacist Pager: [phone] Phone: [phone] 07/07/2017 1:37 PM

## 2017-07-07 NOTE — Assessment & Plan Note (Signed)
-   quit smoking 1970 - PFT's  05/21/14  FEV1 1.37 (94 % ) ratio 66  p no % improvement from saba p ? prior to study with DLCO  39 % corrects to 54  % for alv volume  With classic curvature  -06/02/2017  After extensive coaching HFA effectiveness =    50%  So rx symbicort 80 mg 2bid  At this point :     although there is  copd present, it may not be clinically relevant:   it does not appear to be limiting activity tolerance any more than a set of worn tires limits someone from driving a car  around a parking lot.  A new set of Michelins might look good but would have no perceived impact on the performance of the car and would not be worth the cost.  That is to say:   this pt is so sedentary I don't recommend aggressive pulmonary rx at this point unless limiting symptoms arise or acute exacerbations become as issue, neither of which is the case now.  I asked the patient to contact this office at any time in the future should either of these problems arise.    Pulmonary f/u is prn - she has cpap f/u per Dr Halford Chessman   Each maintenance medication was reviewed in detail including most importantly the difference between maintenance and as needed and under what circumstances the prns are to be used.  Please see AVS for specific  Instructions which are unique to this visit and I personally typed out  which were reviewed in detail in writing with the patient and a copy provided.

## 2017-07-07 NOTE — Telephone Encounter (Signed)
Medication Samples have been provided to the patient.  Drug name: Eliquis       Strength: 5 mg      Qty: 2 bxs  LOT: [id number] Exp.Date: 10/20  Dosing instructions: Take 1 Tablet by mouth twice daily  The patient has been instructed regarding the correct time, dose, and frequency of taking this medication, including desired effects and most common side effects.   Darron Doom 1:47 PM 07/07/2017

## 2017-07-14 ENCOUNTER — Ambulatory Visit (INDEPENDENT_AMBULATORY_CARE_PROVIDER_SITE_OTHER): Payer: Medicare Other | Admitting: Orthopaedic Surgery

## 2017-07-14 ENCOUNTER — Ambulatory Visit (INDEPENDENT_AMBULATORY_CARE_PROVIDER_SITE_OTHER): Payer: Medicare Other

## 2017-07-14 DIAGNOSIS — M79642 Pain in left hand: Secondary | ICD-10-CM | POA: Diagnosis not present

## 2017-07-14 NOTE — Progress Notes (Signed)
Office Visit Note   Patient: [name]           Date of Birth: [birth year]           MRN: [id number] Visit Date: 07/14/2017              Requested by: [name] Rail, MD [place], [place] [address] PCP: [name] Rail, MD   Assessment & Plan: Visit Diagnoses:  1. Pain of left hand     Plan: Nerve conduction study to evaluate for cubital tunnel syndrome. Patient also has significant arthritis of her left hand.  Follow-Up Instructions: Return if symptoms worsen or fail to improve.   Orders:  Orders Placed This Encounter  Procedures  . XR Hand Complete Left  . Ambulatory referral to Physical Medicine Rehab   No orders of the defined types were placed in this encounter.     Procedures: No procedures performed   Clinical Data: No additional findings.   Subjective: Chief Complaint  Patient presents with  . Left Hand - Pain    [name] is a 83 year old female with left hand pain and numbness and stiffness for 2-3 years since progressively gotten worse. She wakes up with her hands numb in her fourth and fifth digits now. She denies any injuries. She originally had numbness in her entire hand but this is progressed just the ulnar 2 digits.    Review of Systems  Constitutional: Negative.   HENT: Negative.   Eyes: Negative.   Respiratory: Negative.   Cardiovascular: Negative.   Endocrine: Negative.   Musculoskeletal: Negative.   Neurological: Negative.   Hematological: Negative.   Psychiatric/Behavioral: Negative.   All other systems reviewed and are negative.    Objective: Vital Signs: There were no vitals taken for this visit.  Physical Exam  Constitutional: She is oriented to person, place, and time. She appears well-developed and well-nourished.  HENT:  Head: Normocephalic and atraumatic.  Eyes: EOM are normal.  Neck: Neck supple.  Pulmonary/Chest: Effort normal.  Abdominal: Soft.  Neurological: She is alert and oriented to  person, place, and time.  Skin: Skin is warm. Capillary refill takes less than 2 seconds.  Psychiatric: She has a normal mood and affect. Her behavior is normal. Judgment and thought content normal.  Nursing note and vitals reviewed.   Ortho Exam Left hand exam shows no atrophy of the dorsal interossei muscle. She has no subluxation of the ulnar nerve. Positive Tinel at cubital tunnel. Elbow range of motion is normal. Good motor and sensory function of her left hand. Specialty Comments:  No specialty comments available.  Imaging: Xr Hand Complete Left  Result Date: 07/14/2017 Advanced osteoarthritis left hand    PMFS History: Patient Active Problem List   Diagnosis Date Noted  . COPD exacerbation ([place]) 05/21/2017  . Arthritis of left sacroiliac joint ([place]) 05/04/2017  . Osteopenia 02/17/2017  . Piriformis syndrome of left side 02/16/2017  . Acquired leg length discrepancy 02/16/2017  . COPD GOLD I  01/27/2017  . Lumbar radiculopathy, acute 01/27/2017  . Rash 01/27/2017  . Granuloma annulare 07/27/2016  . Numbness of fingers 07/27/2016  . Primary osteoarthritis of right knee 06/27/2016  . Diabetes ([place]) 03/07/2016  . Acute renal failure superimposed on stage 3 chronic kidney disease ([place])   . Acute on chronic respiratory failure with hypoxia ([place]) 05/24/2015  . Multifocal pneumonia 05/24/2015  . Sepsis ([place]) 05/24/2015  . OSA on CPAP 08/28/2014  . Chronic diastolic heart failure (  [place]) 07/30/2014  . Nocturnal hypoxemia 07/09/2014  . SOB (shortness of breath) 05/15/2014  . Pulmonary HTN ([place]) 05/15/2014  . Crescendo angina ([place]) 05/02/2014  . Band keratopathy 01/16/2014  . 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 Epiphora due to insufficient drainage 06/13/2013  . Hyperthyroidism 11/04/2011  . Multinodular goiter 10/14/2011  . Atrophy, Fuchs' 09/29/2011  . ATRIAL FIBRILLATION 06/03/2010  . CHF 02/17/2010  . VITILIGO 11/07/2009  . SINOATRIAL NODE DYSFUNCTION  06/16/2009  . Iron deficiency anemia 01/02/2009  . ANEMIA, PERNICIOUS 11/20/2008  . Atrial flutter ([place]) 06/25/2008  . HYPERLIPIDEMIA 01/29/2008  . Coronary atherosclerosis 01/29/2008  . HTN (hypertension) 10/30/2007   Past Medical History:  Diagnosis Date  . Anemia    iron defficiency  . Anginal pain ([place])   . Arthritis    "knees, feet, hands; joints" (05/27/2015)  . Asthma   . Atrial fibrillation or flutter    s/p RFCA 7/08;   s/p DCCV in past;   previously on amiodarone;  amio stopped due to lung toxicity  . CAD (coronary artery disease)    s/p NSTEMI tx with BMS to OM1 3/08;  cath 3/08: pOM 99% tx with PCI, pLAD 20%, ? mod stenosis at the AM  . CAP (community acquired pneumonia) 05/24/2015  . Chronic diastolic heart failure (HCC)    echo 11/11:  EF 55-60%, severe LVH, mod LAE, mild MR, mildly increased PASP  . COPD (chronic obstructive pulmonary disease) ([place])   . Degenerative joint disease   . Dystrophy, corneal stromal   . Heart murmur   . HLD (hyperlipidemia)   . HTN (hypertension)    essential nos  . Hypopotassemia    PMH of  . Muscle pain   . Myocardial infarction ([place]) 2008  . OSA on CPAP   . Osteoporosis   . Pneumonia 05/27/2015  . Protein calorie malnutrition ([place])   . Rash and nonspecific skin eruption    both arms,awaiting bio   dr Delman Cheadle  . Seasonal allergies   . Shortness of breath dyspnea   . Type II diabetes mellitus (HCC)    Dr Loanne Drilling    Family History  Problem Relation Age of Onset  . Diabetes Mother   . Hypertension Mother   . Transient ischemic attack Mother   . Arthritis Mother   . Heart attack Father 15  . Arthritis Father   . Hypertension Father   . Breast cancer Maternal Aunt   . Arthritis Maternal Aunt     Past Surgical History:  Procedure Laterality Date  . A FLUTTER ABLATION     Dr Lovena Le  . CATARACT EXTRACTION W/ INTRAOCULAR LENS  IMPLANT, BILATERAL Bilateral   . CHOLECYSTECTOMY    . COLONOSCOPY  6/07   2 polyps Dr. Kinnie Feil in  [place]  . colonoscopy with polypectomy      X 2; [facility]  . CORNEAL TRANSPLANT Bilateral   . CORONARY ANGIOPLASTY WITH STENT PLACEMENT  02/2007   BMS w/Dr Lovena Le  . EYE SURGERY    . gravid 2 para 2    . KNEE CARTILAGE SURGERY Right   . LEFT AND RIGHT HEART CATHETERIZATION WITH CORONARY ANGIOGRAM N/A 05/07/2014   Procedure: LEFT AND RIGHT HEART CATHETERIZATION WITH CORONARY ANGIOGRAM;  Surgeon: Peter M Martinique, MD;  Location: [facility] CATH LAB;  Service: Cardiovascular;  Laterality: N/A;  . TOTAL KNEE ARTHROPLASTY Right 06/28/2016   Procedure: TOTAL KNEE ARTHROPLASTY;  Surgeon: Frederik Pear, MD;  Location: [facility];  Service: Orthopedics;  Laterality: Right;  Social History   Occupational History  . Teacher    Social History Main Topics  . Smoking status: Former Smoker    Packs/day: 0.25    Years: 18.00    Types: Cigarettes    Quit date: 12/27/1968  . Smokeless tobacco: Never Used     Comment: smoked [place], up to 1 pp week  . Alcohol use Yes     Comment: occ  . Drug use: No  . Sexual activity: No

## 2017-07-19 ENCOUNTER — Other Ambulatory Visit: Payer: Self-pay | Admitting: Pharmacist

## 2017-07-19 NOTE — Patient Outreach (Signed)
[place] [facility]) Care Management  07/19/2017  CHANDLER STOFER [birth year] [id number]   Patient called to inform me she no longer needs [facility] as Dr. Melvyn Novas discontinued it.  A patient assistance application had been completed for the patient and taken to Dr. Gustavus Bryant office for signature and it was mailed.  Patient received an "attestation form" from [employer] and wanted to let me know she was not going to complete the form since she no longer needed it.    However, she discussed at length how she cannot afford Eliquis.  Patient said she was told by her cardiologist's office that they could not continue to give her samples.  At the time of our call, patient had 3 tablets in her possession and said she could not afford to purchase more tablets.  A financial assessment was completed on the patient in June:  -She is over income for LIS  -She has NOT spent the required 3% of her income on prescription expenses to qualify to receive Eliquis at no cost through [place] -As of 05/25/17, patient had spent around $225 on medications. -Patient's copay for Eliquis would be >$200 as she also has not met her deductible for the year  If deemed therapeutically appropriate for financial reasons the patient could be switched to Pradaxa as the [place] Patient Assistance Program does not require the out of pocket spend.  (Xarelto requires a 4% out of pocket medication spend).  The patient could also be put on warfarin which would be a tier 1 medication but would require frequent office visits and copays for monitoring.  Plan: Route note to the PharmD at her Cardiologist's office as she was the last person to interact with the patient about Eliquis.  Follow up with the patient within 1-2 business days.  Elayne Guerin, PharmD, [place] Clinical Pharmacist [phone]

## 2017-07-20 ENCOUNTER — Other Ambulatory Visit: Payer: Self-pay | Admitting: Pharmacist

## 2017-07-20 NOTE — Patient Outreach (Addendum)
[place]) Care Management  07/20/2017  [name] 09-May-1931 [id number]   Called patient in reference to helping her obtain Eliquis.  HIPAA identifiers were obtained. An inbasket message was sent to Scarlette Slice with Cardiology yesterday about changing the patient from Eliquis to Pradaxa the Pradaxa patient assistance program does not require an out-of-pocket spend on medications. The note from yesterday will be routed to the patient's cardiologist.   Patient was encouraged to call the Cardiologist's office back about samples to bridge her until other treatment options can be vetted.  Plan:  Route note to Cardiology  Elayne Guerin, PharmD, [place] Clinical Pharmacist [phone]

## 2017-07-21 ENCOUNTER — Telehealth (HOSPITAL_COMMUNITY): Payer: Self-pay

## 2017-07-21 ENCOUNTER — Other Ambulatory Visit: Payer: Self-pay | Admitting: Pharmacist

## 2017-07-21 NOTE — Patient Outreach (Signed)
[facility]) Care Management  07/21/2017  [name] 02-Nov-1931 [id number]  Received a note back from patient's cardiology office that it would be acceptable for the patient to switch from Eliquis to Pradaxa for financial reasons.  An application for [employer]'s patient assistance program will be mailed to the patient for completion (for Pradaxa). The patient is aware she will need to provide proof of income and signatures.    Once the forms have been received from the patient with the pertinent information, the forms will be faxed to the patient's provider and then on to [employer].  Plan:  Refer patient to [place], Doreene Burke.  Follow up with the patient in 1-2 weeks.  Elayne Guerin, PharmD, [place] Clinical Pharmacist [phone]

## 2017-07-21 NOTE — Telephone Encounter (Signed)
Patient requesting eliquis samples. 3 weeks given to patient. Will make CHF clinical pharmacist Ileene Patrick aware on her return to work next week as this patient may be eligible to switch to pradaxa per other notes regarding this issue to make more affordable for patient.  [name], RN

## 2017-07-25 ENCOUNTER — Ambulatory Visit: Payer: Medicare Other | Admitting: Pharmacist

## 2017-07-27 ENCOUNTER — Encounter: Payer: Self-pay | Admitting: Internal Medicine

## 2017-07-27 ENCOUNTER — Ambulatory Visit (INDEPENDENT_AMBULATORY_CARE_PROVIDER_SITE_OTHER): Payer: Medicare Other | Admitting: Internal Medicine

## 2017-07-27 VITALS — BP 126/60 | HR 79 | Temp 97.5°F | Resp 16 | Wt 179.0 lb

## 2017-07-27 DIAGNOSIS — J449 Chronic obstructive pulmonary disease, unspecified: Secondary | ICD-10-CM

## 2017-07-27 DIAGNOSIS — N183 Chronic kidney disease, stage 3 (moderate): Secondary | ICD-10-CM | POA: Diagnosis not present

## 2017-07-27 DIAGNOSIS — E1122 Type 2 diabetes mellitus with diabetic chronic kidney disease: Secondary | ICD-10-CM

## 2017-07-27 DIAGNOSIS — E782 Mixed hyperlipidemia: Secondary | ICD-10-CM

## 2017-07-27 DIAGNOSIS — Z794 Long term (current) use of insulin: Secondary | ICD-10-CM

## 2017-07-27 DIAGNOSIS — I10 Essential (primary) hypertension: Secondary | ICD-10-CM | POA: Diagnosis not present

## 2017-07-27 NOTE — Assessment & Plan Note (Signed)
She is due for an A1c-we will follow-up with endocrine and have it tested  there Will start back at the gym tomorrow Continue low sugar/carbohydrate diet Management per endocrine

## 2017-07-27 NOTE — Assessment & Plan Note (Signed)
BP well controlled Current regimen effective and well tolerated Continue current medications at current doses  

## 2017-07-27 NOTE — Assessment & Plan Note (Signed)
Intolerant to Lipitor She is appointment with a low fat/cholesterol diet We'll restart regular exercise tomorrow Will recheck lipid panel at her next visit

## 2017-07-27 NOTE — Patient Instructions (Addendum)
   All other Health Maintenance issues reviewed.   All recommended immunizations and age-appropriate screenings are up-to-date or discussed.  No immunizations administered today.   Medications reviewed and updated.  /  No changes recommended at this time.  Your prescription(s) have been submitted to your pharmacy. Please take as directed and contact our office if you believe you are having problem(s) with the medication(s).   Please followup in 6 months   

## 2017-07-27 NOTE — Progress Notes (Signed)
she   Subjective:    Patient ID: [name], female    DOB: 14-Jan-1931, 83 y.o.   MRN: [id number]  HPI The patient is here for follow up.  She has not gotten back to the gym since her pneumonia. It has been two months. She is taking a nap daily in the afternoon for one hour.  Her energy level is slowly improving from her pneumonia.  She has done PT for one month after her pneumonia and still feels weak and unstable.  She plans on starting back at the gym tomorrow.  COPD:  Minimal COPD per pulmonary - not clinically relevant.  Pulmonary discontinued her inhaler. She does experience some shortness of breath, which is likely related to pneumonia and now deconditioning. She denies any cough or wheeze.  Diabetes: she is seeing Dr Loanne Drilling.  She is taking her medication daily as prescribed. She is compliant with a diabetic diet. She is not exercising regularly. She monitors her sugars and they have been running in morning < 130, afternoon or evening maybe 190. She checks her feet daily and denies foot lesions. She is up-to-date with an ophthalmology examination.   Hypertension: She is taking her medication daily. She is compliant with a low sodium diet.  She denies chest pain, palpitations, edema, shortness of breath and regular headaches. She is not exercising regularly.      Hyperlipidemia: She did not tolerate lipitor - it made her weak. She is compliant with a low fat/cholesterol diet. She is not exercising regularly.       Medications and allergies reviewed with patient and updated if appropriate.  Patient Active Problem List   Diagnosis Date Noted  . COPD exacerbation ([place]) 05/21/2017  . Arthritis of left sacroiliac joint ([place]) 05/04/2017  . Osteopenia 02/17/2017  . Piriformis syndrome of left side 02/16/2017  . Acquired leg length discrepancy 02/16/2017  . COPD GOLD I  01/27/2017  . Lumbar radiculopathy, acute 01/27/2017  . Rash 01/27/2017  . Granuloma annulare 07/27/2016    . Numbness of fingers 07/27/2016  . Primary osteoarthritis of right knee 06/27/2016  . Diabetes ([place]) 03/07/2016  . Acute renal failure superimposed on stage 3 chronic kidney disease ([place])   . Acute on chronic respiratory failure with hypoxia ([place]) 05/24/2015  . Multifocal pneumonia 05/24/2015  . Sepsis ([place]) 05/24/2015  . OSA on CPAP 08/28/2014  . Chronic diastolic heart failure ([place]) 07/30/2014  . Nocturnal hypoxemia 07/09/2014  . SOB (shortness of breath) 05/15/2014  . Pulmonary HTN ([place]) 05/15/2014  . Crescendo angina ([place]) 05/02/2014  . Band keratopathy 01/16/2014  . 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 Epiphora due to insufficient drainage 06/13/2013  . Hyperthyroidism 11/04/2011  . Multinodular goiter 10/14/2011  . Atrophy, Fuchs' 09/29/2011  . ATRIAL FIBRILLATION 06/03/2010  . CHF 02/17/2010  . VITILIGO 11/07/2009  . SINOATRIAL NODE DYSFUNCTION 06/16/2009  . Iron deficiency anemia 01/02/2009  . ANEMIA, PERNICIOUS 11/20/2008  . Atrial flutter ([place]) 06/25/2008  . HYPERLIPIDEMIA 01/29/2008  . Coronary atherosclerosis 01/29/2008  . HTN (hypertension) 10/30/2007    Current Outpatient Prescriptions on File Prior to Visit  Medication Sig Dispense Refill  . acetaminophen (TYLENOL) 500 MG tablet Take 1,000 mg by mouth every 6 (six) hours as needed (pain).    Marland Kitchen apixaban (ELIQUIS) 5 MG TABS tablet Take 1 tablet (5 mg total) by mouth 2 (two) times daily. 180 tablet 3  . CALCIUM-MAG-VIT C-VIT D PO Take 1 tablet by mouth daily.    . carvedilol (  COREG) 12.5 MG tablet Take 1 tablet (12.5 mg total) by mouth 2 (two) times daily with a meal. 180 tablet 3  . cholecalciferol (VITAMIN D) 1000 units tablet Take 1,000 Units by mouth daily after supper.    . clotrimazole (LOTRIMIN) 1 % cream Apply 1 application topically 2 (two) times daily. To inner thighs (Patient taking differently: Apply 1 application topically 2 (two) times daily as needed (rash). To inner thighs) 30 g 0  .  diclofenac sodium (VOLTAREN) 1 % GEL Apply 1 application topically 2 (two) times daily as needed (knee pain).     . furosemide (LASIX) 40 MG tablet Take 2 tablets (80 mg total) by mouth daily. 180 tablet 3  . gabapentin (NEURONTIN) 100 MG capsule TAKE 1 CAPSULE BY MOUTH AT  BEDTIME. IF TOLERATED AFTER TWO DAYS INCREASE TO 200 MG NIGHTLY (Patient taking differently: 3 capsules at bedtime) 180 capsule 0  . insulin NPH Human (HUMULIN N,NOVOLIN N) 100 UNIT/ML injection Inject 0.42 mLs (42 Units total) into the skin daily. (Patient taking differently: Inject 40 Units into the skin daily before breakfast. ) 20 mL 11  . Lancets (ONETOUCH ULTRASOFT) lancets Use to check blood sugar 2 times per day. Dx Code E11.9 200 each 2  . methimazole (TAPAZOLE) 5 MG tablet Take 5 mg by mouth 2 (two) times a week. Monday and Friday    . Multiple Vitamin (MULTIVITAMIN WITH MINERALS) TABS tablet Take 1 tablet by mouth daily. Centrum Silver    . nitroGLYCERIN (NITROSTAT) 0.4 MG SL tablet Place 0.4 mg under the tongue every 5 (five) minutes as needed for chest pain. Reported on 01/28/2016    . potassium chloride SA (K-DUR,KLOR-CON) 20 MEQ tablet Take 1 tablet (44mEq) daily every other day alternating with 2 tablets (45mEq) daily every other day (Patient taking differently: Take 20-40 mEq by mouth See admin instructions. Take 1 tablet (59mEq) daily every other day alternating with 2 tablets (84mEq) daily every other day) 135 tablet 3  . Turmeric 500 MG CAPS Take 500 mg by mouth daily after supper.      No current facility-administered medications on file prior to visit.     Past Medical History:  Diagnosis Date  . Anemia    iron defficiency  . Anginal pain ([place])   . Arthritis    "knees, feet, hands; joints" (05/27/2015)  . Asthma   . Atrial fibrillation or flutter    s/p RFCA 7/08;   s/p DCCV in past;   previously on amiodarone;  amio stopped due to lung toxicity  . CAD (coronary artery disease)    s/p NSTEMI tx with BMS  to OM1 3/08;  cath 3/08: pOM 99% tx with PCI, pLAD 20%, ? mod stenosis at the AM  . CAP (community acquired pneumonia) 05/24/2015  . Chronic diastolic heart failure (HCC)    echo 11/11:  EF 55-60%, severe LVH, mod LAE, mild MR, mildly increased PASP  . COPD (chronic obstructive pulmonary disease) ([place])   . Degenerative joint disease   . Dystrophy, corneal stromal   . Heart murmur   . HLD (hyperlipidemia)   . HTN (hypertension)    essential nos  . Hypopotassemia    PMH of  . Muscle pain   . Myocardial infarction ([place]) 2008  . OSA on CPAP   . Osteoporosis   . Pneumonia 05/27/2015  . Protein calorie malnutrition ([place])   . Rash and nonspecific skin eruption    both arms,awaiting bio   dr Delman Cheadle  .  Seasonal allergies   . Shortness of breath dyspnea   . Type II diabetes mellitus ([place])    Dr Loanne Drilling    Past Surgical History:  Procedure Laterality Date  . A FLUTTER ABLATION     Dr Lovena Le  . CATARACT EXTRACTION W/ INTRAOCULAR LENS  IMPLANT, BILATERAL Bilateral   . CHOLECYSTECTOMY    . COLONOSCOPY  6/07   2 polyps Dr. Kinnie Feil in [place]  . colonoscopy with polypectomy      X 2; [facility]  . CORNEAL TRANSPLANT Bilateral   . CORONARY ANGIOPLASTY WITH STENT PLACEMENT  02/2007   BMS w/Dr Lovena Le  . EYE SURGERY    . gravid 2 para 2    . KNEE CARTILAGE SURGERY Right   . LEFT AND RIGHT HEART CATHETERIZATION WITH CORONARY ANGIOGRAM N/A 05/07/2014   Procedure: LEFT AND RIGHT HEART CATHETERIZATION WITH CORONARY ANGIOGRAM;  Surgeon: Peter M Martinique, MD;  Location: [facility] CATH LAB;  Service: Cardiovascular;  Laterality: N/A;  . TOTAL KNEE ARTHROPLASTY Right 06/28/2016   Procedure: TOTAL KNEE ARTHROPLASTY;  Surgeon: Frederik Pear, MD;  Location: [facility];  Service: Orthopedics;  Laterality: Right;    Social History   Social History  . Marital status: Widowed    Spouse name: N/A  . Number of children: N/A  . Years of education: N/A   Occupational History  . Teacher    Social History Main Topics  .  Smoking status: Former Smoker    Packs/day: 0.25    Years: 18.00    Types: Cigarettes    Quit date: 12/27/1968  . Smokeless tobacco: Never Used     Comment: smoked [place], up to 1 pp week  . Alcohol use Yes     Comment: occ  . Drug use: No  . Sexual activity: No   Other Topics Concern  . Not on file   Social History Narrative   Teacher. Widowed. Rarely drinks cafeine.     Family History  Problem Relation Age of Onset  . Diabetes Mother   . Hypertension Mother   . Transient ischemic attack Mother   . Arthritis Mother   . Heart attack Father 40  . Arthritis Father   . Hypertension Father   . Breast cancer Maternal Aunt   . Arthritis Maternal Aunt     Review of Systems  Constitutional: Negative for chills and fever.  Respiratory: Positive for shortness of breath (with exertion - moderate). Negative for cough and wheezing.   Cardiovascular: Positive for leg swelling (intermittent, mild). Negative for chest pain and palpitations.  Neurological: Negative for light-headedness and headaches.       Objective:   Vitals:   07/27/17 1107  BP: 126/60  Pulse: 79  Resp: 16  Temp: (!) 97.5 F (36.4 C)   Wt Readings from Last 3 Encounters:  07/27/17 179 lb (81.2 kg)  07/06/17 177 lb 9.6 oz (80.6 kg)  06/27/17 175 lb 6.4 oz (79.6 kg)   Body mass index is 32.74 kg/m.   Physical Exam    Constitutional: Appears well-developed and well-nourished. No distress.  HENT:  Head: Normocephalic and atraumatic.  Neck: Neck supple. No tracheal deviation present. No thyromegaly present.  No cervical lymphadenopathy Cardiovascular: Normal rate, regular rhythm and normal heart sounds.   No murmur heard. No carotid bruit .  No edema Pulmonary/Chest: Effort normal and breath sounds normal. No respiratory distress. No has no wheezes. No rales.  Skin: Skin is warm and dry. Not diaphoretic.  Psychiatric: Normal mood  and affect. Behavior is normal.      Assessment & Plan:     COPD: Negative for pulmonary Not clinically relevant Inhalers discontinued She does experience minimal shortness of breath, which may be related to recent pneumonia and some deconditioning No other concerning symptoms   See Problem List for Assessment and Plan of chronic medical problems.

## 2017-07-29 ENCOUNTER — Ambulatory Visit: Payer: Medicare Other | Admitting: Pharmacist

## 2017-07-29 ENCOUNTER — Other Ambulatory Visit: Payer: Self-pay | Admitting: Pharmacist

## 2017-07-29 NOTE — Patient Outreach (Signed)
[place] [facility]) Care Management  07/29/2017  [name] December 26, 1931 [id number]   Called patient regarding medication assistance. Patient confirmed she received the applications that were mailed to her. She said she will complete the necessary information and sent back as soon as she can.  Plan:  Follow up with the patient in 10 business days.  Elayne Guerin, PharmD, [place] Clinical Pharmacist [phone]

## 2017-08-03 ENCOUNTER — Encounter (INDEPENDENT_AMBULATORY_CARE_PROVIDER_SITE_OTHER): Payer: Medicare Other | Admitting: Physical Medicine and Rehabilitation

## 2017-08-04 ENCOUNTER — Other Ambulatory Visit: Payer: Self-pay | Admitting: Internal Medicine

## 2017-08-04 NOTE — Telephone Encounter (Signed)
Last filled 06/16/17, please advise.

## 2017-08-09 ENCOUNTER — Other Ambulatory Visit: Payer: Self-pay | Admitting: Pharmacy Technician

## 2017-08-09 NOTE — Patient Outreach (Signed)
[facility]) Care Management  08/09/2017  [name] November 30, 1931 [id number]   Received patient's completed application and will now fax to her 59 office (Dr. Aundra Dubin) at [phone]. I will send this to the attention of Kevan Rosebush who is the nurse. Once I received the physician's completed portion I will fax to [place] for them evaluate if patient qualifies for patient assistance for Pradaxa.  Doreene Burke, [place] [phone]

## 2017-08-12 ENCOUNTER — Other Ambulatory Visit: Payer: Self-pay | Admitting: Pharmacist

## 2017-08-12 NOTE — Patient Outreach (Signed)
[name] Care Management  08/12/2017  [name] 05/22/1931 [id number]   Patient was called to follow up on medication assistance. Unfortunately the patient did not answer the phone. HIPAA compliant message left on the patient's voicemail.    [place] faxed the patient's application to Dr. Aundra Dubin to the attention of Kevan Rosebush on 08/09/17 and is awaiting the physician signature and form completion before sending the application to the patient assistance program.   Plan:  Follow up with patient/program in 10-14 business days.  Elayne Guerin, PharmD, [place] Clinical Pharmacist [phone]

## 2017-08-15 ENCOUNTER — Telehealth (HOSPITAL_COMMUNITY): Payer: Self-pay | Admitting: *Deleted

## 2017-08-15 NOTE — Telephone Encounter (Signed)
Patient called asking if we had received the patient assistance forms from [place].  I was unable to find the forms that were originally faxed on 8/14.  I send a staff message to Katrina boyd asking for her to refax them to [place].  Patient is aware and knows that I'm leaving two weeks of samples for her to pick up at the front desk.     Medication Samples have been provided to the patient.  Drug name: Eliquis      Strength: 5 mg        Qty: 2 LOT: [id number] Exp.Date: 10/20 Dosing instructions: Take 1 Tablet twice daily  The patient has been instructed regarding the correct time, dose, and frequency of taking this medication, including desired effects and most common side effects.   Darron Doom 2:09 PM 08/15/2017

## 2017-08-16 ENCOUNTER — Other Ambulatory Visit: Payer: Self-pay | Admitting: Pharmacist

## 2017-08-16 ENCOUNTER — Other Ambulatory Visit (HOSPITAL_COMMUNITY): Payer: Self-pay | Admitting: *Deleted

## 2017-08-16 MED ORDER — DABIGATRAN ETEXILATE MESYLATE 150 MG PO CAPS: 150.0000 mg | ORAL_CAPSULE | Freq: Two times a day (BID) | ORAL | 3 refills | Status: DC

## 2017-08-16 NOTE — Patient Outreach (Signed)
[name]) Care Management  08/16/2017  [name] 30-Jun-1931 [id number]   Patient called stating she received a call from her community pharmacy stating a prescription for Pradaxa was ready for her to pick up but the price was >$375.00  [facility] requested the Pradaxa application be signed and a prescription sent back to our office so we could fax the application, prescription and supporting documentation to [place] on the patient's behalf.  Patient said she would call the pharmacy and tell them to put the prescription on hold.  [name], Doreene Burke, confirmed receipt of the Pradaxa application and prescription today. Crystal also confirmed the application was faxed.  Plan:  Crystal will follow up with the patient/provider as needed.   Elayne Guerin, PharmD, [place] Clinical Pharmacist [phone]

## 2017-08-17 DIAGNOSIS — M1711 Unilateral primary osteoarthritis, right knee: Secondary | ICD-10-CM | POA: Diagnosis not present

## 2017-08-17 DIAGNOSIS — M1712 Unilateral primary osteoarthritis, left knee: Secondary | ICD-10-CM | POA: Diagnosis not present

## 2017-08-17 DIAGNOSIS — M25552 Pain in left hip: Secondary | ICD-10-CM | POA: Diagnosis not present

## 2017-08-18 ENCOUNTER — Telehealth: Payer: Self-pay | Admitting: Internal Medicine

## 2017-08-18 MED ORDER — GABAPENTIN 100 MG PO CAPS: 300.0000 mg | ORAL_CAPSULE | Freq: Every day | ORAL | 3 refills | Status: DC

## 2017-08-18 NOTE — Telephone Encounter (Signed)
Ok to fill - sent 

## 2017-08-18 NOTE — Telephone Encounter (Signed)
PLease advise, she has not seen Dr Tamala Julian since May

## 2017-08-18 NOTE — Telephone Encounter (Signed)
Pt is needing a refill on her gabapentin (NEURONTIN) 100 MG capsule. It was prescribed originally for two a day but it was recommended by Dr Tamala Julian for her to take 3. Can this be refilled for her? She is almost out because she has been taking 3.

## 2017-08-19 MED ORDER — GABAPENTIN 100 MG PO CAPS: 300.0000 mg | ORAL_CAPSULE | Freq: Every day | ORAL | 3 refills | Status: DC

## 2017-08-19 NOTE — Telephone Encounter (Signed)
Pt called in and said that the Gabapentin should have been sent [facility] not [facility].  Can we [facility]?

## 2017-08-19 NOTE — Telephone Encounter (Signed)
Pt said that this prescription should have been sent to [facility]. She has 2 or 3 days left. It is covered when it goes through [facility] rather than [facility].  Fax # [phone]

## 2017-08-19 NOTE — Telephone Encounter (Signed)
Resent Gabapentin to [facility]...[name]

## 2017-08-19 NOTE — Addendum Note (Signed)
Addended by: Earnstine Regal on: 08/19/2017 03:49 PM   Modules accepted: Orders

## 2017-08-23 ENCOUNTER — Encounter: Payer: Self-pay | Admitting: Endocrinology

## 2017-08-23 ENCOUNTER — Ambulatory Visit (INDEPENDENT_AMBULATORY_CARE_PROVIDER_SITE_OTHER): Payer: Medicare Other | Admitting: Endocrinology

## 2017-08-23 VITALS — BP 120/82 | HR 72 | Wt 181.4 lb

## 2017-08-23 DIAGNOSIS — E042 Nontoxic multinodular goiter: Secondary | ICD-10-CM

## 2017-08-23 DIAGNOSIS — Z794 Long term (current) use of insulin: Secondary | ICD-10-CM | POA: Diagnosis not present

## 2017-08-23 DIAGNOSIS — N183 Chronic kidney disease, stage 3 unspecified: Secondary | ICD-10-CM

## 2017-08-23 DIAGNOSIS — E1122 Type 2 diabetes mellitus with diabetic chronic kidney disease: Secondary | ICD-10-CM | POA: Diagnosis not present

## 2017-08-23 LAB — TSH: TSH: 2.03 u[IU]/mL (ref 0.35–4.50)

## 2017-08-23 LAB — T4, FREE: Free T4: 0.87 ng/dL (ref 0.60–1.60)

## 2017-08-23 LAB — POCT GLYCOSYLATED HEMOGLOBIN (HGB A1C): Hemoglobin A1C: 7.4

## 2017-08-23 MED ORDER — INSULIN NPH (HUMAN) (ISOPHANE) 100 UNIT/ML ~~LOC~~ SUSP: 40.0000 [IU] | Freq: Every day | SUBCUTANEOUS | 11 refills | Status: DC

## 2017-08-23 NOTE — Patient Instructions (Addendum)
check your blood sugar twice a day.  vary the time of day when you check, between before the 3 meals, and at bedtime.  also check if you have symptoms of your blood sugar being too high or too low.  please keep a record of the readings and bring it to your next appointment here.  please call us sooner if your blood sugar goes below 70, or if you have a lot of readings over 200.     If ever you have fever while taking methimazole, stop it and call us, even if the reason is obvious, because of the risk of a rare side-effect.   On this type of insulin schedule, you should eat meals on a regular schedule--especially lunch.  If a meal is missed or significantly delayed, your blood sugar could go low.  blood tests are requested for you today.  We'll let you know about the results. Please continue the same insulin: 40 units each morning.  However, if you are going to be active, take just 25 units.   Please come back for a follow-up appointment in 3 months.

## 2017-08-23 NOTE — Progress Notes (Signed)
Subjective:    Patient ID: [name], female    DOB: 07/31/1931, 83 y.o.   MRN: [id number]  HPI Pt returns for f/u of diabetes mellitus:  DM type: Insulin-requiring type 2.   Dx'ed: 3220 Complications: CAD, polyneuropathy, and renal insufficiency.  Therapy: insulin since 2007.  GDM: never.  DKA: never. Severe hypoglycemia: never.  Pancreatitis: never.  Other: she changed to qd insulin, after poor results with multiple daily injections; she was changed from lantus to QAM NPH, due to am hypoglycemia and PM hyperglycemia.   Interval history: no cbg record, but states cbg's are well-controlled.  It is in general higher as the day goes on.  She seldom has hypoglycemia, and these episodes are mild.  This happens in the afternoon, after a smaller than expected lunch.  She takes 40 units qam. Physical activity is resuming, after hospitalization 2 mos ago.   Pt also returns for f/u of hyperthyroidism (dx'ed 2012; [place] then showed multinodular goiter; tapazole was chosen as rx, due to low uptake on nuc med scan).  Past Medical History:  Diagnosis Date  . Anemia    iron defficiency  . Anginal pain ([place])   . Arthritis    "knees, feet, hands; joints" (05/27/2015)  . Asthma   . Atrial fibrillation or flutter    s/p RFCA 7/08;   s/p DCCV in past;   previously on amiodarone;  amio stopped due to lung toxicity  . CAD (coronary artery disease)    s/p NSTEMI tx with BMS to OM1 3/08;  cath 3/08: pOM 99% tx with PCI, pLAD 20%, ? mod stenosis at the AM  . CAP (community acquired pneumonia) 05/24/2015  . Chronic diastolic heart failure (HCC)    echo 11/11:  EF 55-60%, severe LVH, mod LAE, mild MR, mildly increased PASP  . COPD (chronic obstructive pulmonary disease) ([place])   . Degenerative joint disease   . Dystrophy, corneal stromal   . Heart murmur   . HLD (hyperlipidemia)   . HTN (hypertension)    essential nos  . Hypopotassemia    PMH of  . Muscle pain   . Myocardial infarction ([place])  2008  . OSA on CPAP   . Osteoporosis   . Pneumonia 05/27/2015  . Protein calorie malnutrition ([place])   . Rash and nonspecific skin eruption    both arms,awaiting bio   dr Delman Cheadle  . Seasonal allergies   . Shortness of breath dyspnea   . Type II diabetes mellitus ([place])    Dr Loanne Drilling    Past Surgical History:  Procedure Laterality Date  . A FLUTTER ABLATION     Dr Lovena Le  . CATARACT EXTRACTION W/ INTRAOCULAR LENS  IMPLANT, BILATERAL Bilateral   . CHOLECYSTECTOMY    . COLONOSCOPY  6/07   2 polyps Dr. Kinnie Feil in [place]  . colonoscopy with polypectomy      X 2; [place]  [facility]  . CORNEAL TRANSPLANT Bilateral   . CORONARY ANGIOPLASTY WITH STENT PLACEMENT  02/2007   BMS w/Dr Lovena Le  . EYE SURGERY    . gravid 2 para 2    . KNEE CARTILAGE SURGERY Right   . LEFT AND RIGHT HEART CATHETERIZATION WITH CORONARY ANGIOGRAM N/A 05/07/2014   Procedure: LEFT AND RIGHT HEART CATHETERIZATION WITH CORONARY ANGIOGRAM;  Surgeon: Peter M Martinique, MD;  Location: [facility] CATH LAB;  Service: Cardiovascular;  Laterality: N/A;  . TOTAL KNEE ARTHROPLASTY Right 06/28/2016   Procedure: TOTAL KNEE ARTHROPLASTY;  Surgeon: Frederik Pear, MD;  Location:  [facility] OR;  Service: Orthopedics;  Laterality: Right;    Social History   Social History  . Marital status: Widowed    Spouse name: N/A  . Number of children: N/A  . Years of education: N/A   Occupational History  . Teacher    Social History Main Topics  . Smoking status: Former Smoker    Packs/day: 0.25    Years: 18.00    Types: Cigarettes    Quit date: 12/27/1968  . Smokeless tobacco: Never Used     Comment: smoked [place], up to 1 pp week  . Alcohol use Yes     Comment: occ  . Drug use: No  . Sexual activity: No   Other Topics Concern  . Not on file   Social History Narrative   Teacher. Widowed. Rarely drinks cafeine.     Current Outpatient Prescriptions on File Prior to Visit  Medication Sig Dispense Refill  . acetaminophen (TYLENOL) 500 MG tablet Take 1,000 mg  by mouth every 6 (six) hours as needed (pain).    . CALCIUM-MAG-VIT C-VIT D PO Take 1 tablet by mouth daily.    . carvedilol (COREG) 12.5 MG tablet Take 1 tablet (12.5 mg total) by mouth 2 (two) times daily with a meal. 180 tablet 3  . cholecalciferol (VITAMIN D) 1000 units tablet Take 1,000 Units by mouth daily after supper.    . clotrimazole (LOTRIMIN) 1 % cream Apply 1 application topically 2 (two) times daily. To inner thighs (Patient taking differently: Apply 1 application topically 2 (two) times daily as needed (rash). To inner thighs) 30 g 0  . dabigatran (PRADAXA) 150 MG CAPS capsule Take 1 capsule (150 mg total) by mouth 2 (two) times daily. 180 capsule 3  . diclofenac sodium (VOLTAREN) 1 % GEL Apply 1 application topically 2 (two) times daily as needed (knee pain).     . furosemide (LASIX) 40 MG tablet Take 2 tablets (80 mg total) by mouth daily. 180 tablet 3  . gabapentin (NEURONTIN) 100 MG capsule Take 3 capsules (300 mg total) by mouth at bedtime. 270 capsule 3  . Lancets (ONETOUCH ULTRASOFT) lancets Use to check blood sugar 2 times per day. Dx Code E11.9 200 each 2  . methimazole (TAPAZOLE) 5 MG tablet Take 5 mg by mouth 2 (two) times a week. Monday and Friday    . Multiple Vitamin (MULTIVITAMIN WITH MINERALS) TABS tablet Take 1 tablet by mouth daily. Centrum Silver    . nitroGLYCERIN (NITROSTAT) 0.4 MG SL tablet Place 0.4 mg under the tongue every 5 (five) minutes as needed for chest pain. Reported on 01/28/2016    . potassium chloride SA (K-DUR,KLOR-CON) 20 MEQ tablet Take 1 tablet (52mEq) daily every other day alternating with 2 tablets (72mEq) daily every other day (Patient taking differently: Take 20-40 mEq by mouth See admin instructions. Take 1 tablet (38mEq) daily every other day alternating with 2 tablets (20mEq) daily every other day) 135 tablet 3  . Turmeric 500 MG CAPS Take 500 mg by mouth daily after supper.      No current facility-administered medications on file prior to  visit.     Allergies  Allergen Reactions  . Amlodipine Besylate Other (See Comments)    REACTION: tingling in lips & gum edema  . Levaquin [Levofloxacin] Shortness Of Breath and Swelling    angioedema  . Lobster [Shellfish Allergy] Other (See Comments)    angioedema  . Penicillins Rash    Has patient had a PCN  reaction causing immediate rash, facial/tongue/throat swelling, SOB or lightheadedness with hypotension: Yes Has patient had a PCN reaction causing severe rash involving mucus membranes or skin necrosis: No Has patient had a PCN reaction that required hospitalization: No Has patient had a PCN reaction occurring within the last 10 years: No If all of the above answers are "NO", then may proceed with Cephalosporin use.  . Valsartan Other (See Comments)    REACTION: angioedema  . Codeine Other (See Comments)    Mental status changes  . Lipitor [Atorvastatin] Other (See Comments)    weakness  . Oxycodone Other (See Comments)    hallucinations  . Statins     Made her too weak  . Tramadol Hcl Nausea And Vomiting    Family History  Problem Relation Age of Onset  . Diabetes Mother   . Hypertension Mother   . Transient ischemic attack Mother   . Arthritis Mother   . Heart attack Father 57  . Arthritis Father   . Hypertension Father   . Breast cancer Maternal Aunt   . Arthritis Maternal Aunt     BP 120/82   Pulse 72   Wt 181 lb 6.4 oz (82.3 kg)   SpO2 95%   BMI 33.18 kg/m    Review of Systems Denies fever    Objective:   Physical Exam VITAL SIGNS:  See vs page GENERAL: no distress Pulses: foot pulses are intact bilaterally.   MSK: no deformity of the feet or ankles.  CV: no edema of the legs or ankles Skin:  no ulcer on the feet or ankles.  normal color and temp on the feet and ankles Neuro: sensation is intact to touch on the feet and ankles.   Ext: There is bilateral onychomycosis of the toenails.   A1c=7.4%     Assessment & Plan:  Hyperthyroidism:  due for recheck. Type 2 DM: this is the best control this pt should aim for, given this regimen, which does match insulin to her changing needs throughout the day.  Hypothyroidism: due to missing lunch.   Patient Instructions  check your blood sugar twice a day.  vary the time of day when you check, between before the 3 meals, and at bedtime.  also check if you have symptoms of your blood sugar being too high or too low.  please keep a record of the readings and bring it to your next appointment here.  please call us sooner if your blood sugar goes below 70, or if you have a lot of readings over 200.     If ever you have fever while taking methimazole, stop it and call us, even if the reason is obvious, because of the risk of a rare side-effect.   On this type of insulin schedule, you should eat meals on a regular schedule--especially lunch.  If a meal is missed or significantly delayed, your blood sugar could go low.  blood tests are requested for you today.  We'll let you know about the results. Please continue the same insulin: 40 units each morning.  However, if you are going to be active, take just 25 units.   Please come back for a follow-up appointment in 3 months.

## 2017-08-24 DIAGNOSIS — M25552 Pain in left hip: Secondary | ICD-10-CM | POA: Diagnosis not present

## 2017-08-25 DIAGNOSIS — G4733 Obstructive sleep apnea (adult) (pediatric): Secondary | ICD-10-CM | POA: Diagnosis not present

## 2017-08-26 ENCOUNTER — Other Ambulatory Visit: Payer: Self-pay | Admitting: Pharmacist

## 2017-08-26 ENCOUNTER — Ambulatory Visit: Payer: Self-pay | Admitting: Pharmacist

## 2017-08-26 NOTE — Patient Outreach (Signed)
[facility]) Care Management  08/26/2017  [name] December 16, 1931 [id number]   [place] to follow up on the status of the patient's application. The representative said the patient was approved for Pradaxa 150mg  until 12/26/17 and a 3 month supply was sent on 08/22/17.  Patient was called to inform her but she did not answer her home phone or cell.  HIPAA compliant message was left on both voicemails.  Plan:  1. Call patient back in 5-7 days to make sure she received her medication  2.  Let her provider know she was approved.   Elayne Guerin, PharmD, [place] Clinical Pharmacist [phone]

## 2017-08-31 ENCOUNTER — Other Ambulatory Visit: Payer: Self-pay | Admitting: Pharmacist

## 2017-08-31 NOTE — Patient Outreach (Signed)
[facility]) Care Management  08/31/2017  [name] Nov 05, 1931 [id number]   Called patient to follow up with her. HIPAA identifiers were obtained.  Patient confirmed she received a 90 day supply of Pradaxa in the mail.  Patient was educated on how to reorder Pradaxa from the company. She is approved through 12/26/2017.   Plan:  Follow up with the patient in November to assist with reorder.  Elayne Guerin, PharmD, [place] Clinical Pharmacist [phone]

## 2017-09-05 NOTE — Telephone Encounter (Signed)
Opened in error

## 2017-10-10 ENCOUNTER — Encounter: Payer: Self-pay | Admitting: Internal Medicine

## 2017-10-10 ENCOUNTER — Ambulatory Visit (INDEPENDENT_AMBULATORY_CARE_PROVIDER_SITE_OTHER): Payer: Medicare Other | Admitting: Internal Medicine

## 2017-10-10 VITALS — BP 140/60 | HR 69 | Ht 62.0 in | Wt 184.4 lb

## 2017-10-10 DIAGNOSIS — J449 Chronic obstructive pulmonary disease, unspecified: Secondary | ICD-10-CM

## 2017-10-10 DIAGNOSIS — Z9989 Dependence on other enabling machines and devices: Secondary | ICD-10-CM | POA: Diagnosis not present

## 2017-10-10 DIAGNOSIS — Z23 Encounter for immunization: Secondary | ICD-10-CM

## 2017-10-10 DIAGNOSIS — G4733 Obstructive sleep apnea (adult) (pediatric): Secondary | ICD-10-CM

## 2017-10-10 DIAGNOSIS — L820 Inflamed seborrheic keratosis: Secondary | ICD-10-CM | POA: Diagnosis not present

## 2017-10-10 NOTE — Patient Instructions (Addendum)
Schedule an appointment with Dr Halford Chessman next opening for return patient for sleep medicine   Pulmonary follow up is as needed

## 2017-10-10 NOTE — Progress Notes (Signed)
Subjective:     Patient ID: [name], female   DOB: Nov 19, 1931,    MRN: [id number]    Brief patient profile:  55 yobf quit smoking  1970 with GOLD I criteria for copd as of 04/2014 but with baseline able to work out at [employer] and eliptical x one mile x 40 min s inhalers  then abuptly ill  sob/cough x 2 weeks p onset admitted:   Admit date: 05/21/2017 Discharge date: 05/27/2017  Brief/Interim Summary: 83 year old female with past medical history essential hypertension, chronic A. fib on apixiban, diabetes mellitus type 2 on insulin, COPD not oxygen dependent neck comes in for hypoxia to the ER was found to have multifocal pneumonia.  Discharge Diagnoses:  Principal Problem:   COPD exacerbation ([place]) -   Active Problems:   ATRIAL FIBRILLATION   Hyperthyroidism   Chronic diastolic heart failure (HCC)   OSA on CPAP   Acute on chronic respiratory failure with hypoxia (HCC)   Multifocal pneumonia   Diabetes ([place])  Acute hypoxic respiratory failure due to COPD exacerbation and multifocal pneumonia: Saturations stabilize with oxygen she was started on IV Solu-Medrol and IV antibiotics. It took about 6 days for her saturations to improve, she was started on Spiriva and albuterol, physical therapy evaluated the patient and recommended home health. She was changed to oral steroids so she will continue for 5 additional days and 2 additional days of oral antibiotics. She will follow-up with her primary physician as an outpatient and get PFTs at this point.  Chronic atrial fibrillation: No changes were made to her medication.  Possible Acute on chronic diastolic congestive heart failure She had a 2-D echo on 03/04/2015, that showed no diastolic dysfunction. In the hospital she was essentially volume overload she was treated with IV Lasix she was negative about 5 L. Which is compatible with her weight came down from 83 kg to 78.kg. She'll continue her current home Lasix dose with  potassium supplementation. Next I she will need a 2-D echo as an outpatient.  Essential hypertension: No changes were made her medication.  Diabetes mellitus type 2: No changes were made to her medication she would be a good candidate to start on metformin as an outpatient.  Coronary artery Disease: No changes made to her medication.  Obstructive sleep apnea: Continued C Pap.  Hyperthyroidism: No changes made to her methimazole     06/02/2017 1st [facility] office visit/ Wert  GOLD I COPD now on symb 160/spiriva  Chief Complaint  Patient presents with  . Pulmonary Consult    Referred by hospitalist after having recent "COPD exacerbation" hospitalized from 05/21/17 until 05/27/17.  She states her breathing has improved some, but not back at her normal baseline.  She is coughing with no mucus production.   more than 50% back to baseline but  still lacks energy / can't smell  Doe = MMRC2 = can't walk a nl pace on a flat grade s sob but does fine slow and flat  rec Stop spiriva and continue symbiocort 80(or dulera 100)  Take 2 puffs first thing in am and then another 2 puffs about 12 hours later.  Work on inhaler technique:    07/06/2017  f/u ov/Wert re:  GOLD I COPD/ symb 80 2bid  (or dulera 100 depending on insurance) no saba Chief Complaint  Patient presents with  . Follow-up    Pt states her breathing is doing well  100% back to baseline and now doe =  MMRC1 = can walk nl pace, flat grade, can't hurry or go uphills or steps s sob   More limited by hips/ knees than sob with adls rec Ok to try taper off dulera and restart if breathing worsens and if so restart immediatedly at 2 puffs every 12 hours > did not restart     10/10/2017  f/u ov/Wert re: copd GOLD I/ no longer aon any resp rx  Chief Complaint  Patient presents with  . Follow-up    Breathing is unchanged. She denies any new co's today.  Not limited by breathing from desired activities  Though not very  active  No obvious day to day or daytime variability or assoc excess/ purulent sputum or mucus plugs or hemoptysis or cp or chest tightness, subjective wheeze or overt sinus or hb symptoms. No unusual exp hx or h/o childhood pna/ asthma or knowledge of premature birth.  Sleeping ok flat without nocturnal  or early am exacerbation  of respiratory  c/o's or need for noct saba. Also denies any obvious fluctuation of symptoms with weather or environmental changes or other aggravating or alleviating factors except as outlined above   Current Allergies, Complete Past Medical History, Past Surgical History, Family History, and Social History were reviewed in [employer] record.  ROS  The following are not active complaints unless bolded Hoarseness, sore throat, dysphagia, dental problems, itching, sneezing,  nasal congestion or discharge of excess mucus or purulent secretions, ear ache,   fever, chills, sweats, unintended wt loss or wt gain, classically pleuritic or exertional cp,  orthopnea pnd or leg swelling, presyncope, palpitations, abdominal pain, anorexia, nausea, vomiting, diarrhea  or change in bowel habits or change in bladder habits, change in stools or change in urine, dysuria, hematuria,  rash, arthralgias, visual complaints, headache, numbness, weakness or ataxia or problems with walking or coordination,  change in mood/affect or memory.        Current Meds  Medication Sig  . acetaminophen (TYLENOL) 500 MG tablet Take 1,000 mg by mouth every 6 (six) hours as needed (pain).  . CALCIUM-MAG-VIT C-VIT D PO Take 1 tablet by mouth daily.  . carvedilol (COREG) 12.5 MG tablet Take 1 tablet (12.5 mg total) by mouth 2 (two) times daily with a meal.  . cholecalciferol (VITAMIN D) 1000 units tablet Take 1,000 Units by mouth daily after supper.  . clotrimazole (LOTRIMIN) 1 % cream Apply 1 application topically 2 (two) times daily. To inner thighs (Patient taking differently: Apply  1 application topically 2 (two) times daily as needed (rash). To inner thighs)  . dabigatran (PRADAXA) 150 MG CAPS capsule Take 1 capsule (150 mg total) by mouth 2 (two) times daily.  . diclofenac sodium (VOLTAREN) 1 % GEL Apply 1 application topically 2 (two) times daily as needed (knee pain).   . furosemide (LASIX) 40 MG tablet Take 2 tablets (80 mg total) by mouth daily.  [place] gabapentin (NEURONTIN) 100 MG capsule Take 3 capsules (300 mg total) by mouth at bedtime.  . insulin NPH Human (HUMULIN N,NOVOLIN N) 100 UNIT/ML injection Inject 0.4 mLs (40 Units total) into the skin daily before breakfast. And syringes 1/day  . Lancets (ONETOUCH ULTRASOFT) lancets Use to check blood sugar 2 times per day. Dx Code E11.9  . methimazole (TAPAZOLE) 5 MG tablet Take 5 mg by mouth 2 (two) times a week. Monday and Friday  . Multiple Vitamin (MULTIVITAMIN WITH MINERALS) TABS tablet Take 1 tablet by mouth daily. Centrum Silver  .  nitroGLYCERIN (NITROSTAT) 0.4 MG SL tablet Place 0.4 mg under the tongue every 5 (five) minutes as needed for chest pain. Reported on 01/28/2016  . potassium chloride SA (K-DUR,KLOR-CON) 20 MEQ tablet Take 1 tablet (38mEq) daily every other day alternating with 2 tablets (4mEq) daily every other day (Patient taking differently: Take 20-40 mEq by mouth See admin instructions. Take 1 tablet (66mEq) daily every other day alternating with 2 tablets (76mEq) daily every other day)  . Turmeric 500 MG CAPS Take 500 mg by mouth daily after supper.                    Objective:   Physical Exam    amb pleasant bf nad - all smiles  10/10/2017      184   07/07/2017      177   06/02/17 173 lb (78.5 kg)  06/01/17 174 lb (78.9 kg)  05/27/17 173 lb 12.8 oz (78.8 kg)    Vital signs reviewed  - - Note on arrival 02 sats  93% on RA     HEENT: nl dentition, turbinates bilaterally, and oropharynx. Nl external ear canals without cough reflex Modified Mallampati Score =  3/4   NECK :  without  JVD/Nodes/TM/ nl carotid upstrokes bilaterally   LUNGS: no acc muscle use,  Nl contour chest which is clear to A and P bilaterally without cough on insp or exp maneuvers   CV:  RRR  no s3 or murmur or increase in P2, and no edema   ABD:  soft and nontender with nl inspiratory excursion in the supine position. No bruits or organomegaly appreciated, bowel sounds nl  MS:  Nl gait/ ext warm without deformities, calf tenderness, cyanosis or clubbing No obvious joint restrictions   SKIN: warm and dry without lesions    NEURO:  alert, approp, nl sensorium with  no motor or cerebellar deficits apparent.       .   CXR PA and Lateral:   06/02/2017 :    I personally reviewed images and agree with radiology impression as follows:    The lungs are well-expanded. There is no focal infiltrate the interstitial markings are mildly prominent but stable. There is no pleural effusion. The heart and pulmonary vascularity are normal. There is calcification in the wall of the aortic arch. There is gentle levocurvature centered at the thoracolumbar junction.     Assessment:

## 2017-10-11 NOTE — Assessment & Plan Note (Signed)
-   quit smoking 1970 - PFT's  05/21/14  FEV1 1.37 (94 % ) ratio 66  p no % improvement from saba p ? prior to study with DLCO  39 % corrects to 54  % for alv volume  With classic curvature  -06/02/2017  After extensive coaching HFA effectiveness =    50%  So rx symbicort 80 mg 2bid> d/c 07/06/17 and no worse off it by 10/10/2017 so pulmonary f/u prn      I reviewed the Fletcher curve with the patient that basically indicates  if you quit smoking when your best day FEV1 is still well preserved (as is clearly  the case here)  it is highly unlikely you will progress to severe disease and informed the patient there was  no medication on the market that has proven to alter the curve/ its downward trajectory  or the likelihood of progression of their disease(unlike other chronic medical conditions such as atheroclerosis where we do think we can change the natural hx with risk reducing meds)    Therefore  maintaining abstinence is the most important aspect of care, not choice of inhalers or for that matter, doctors.    rx is for symptoms or to prevent exac, neither of which apply here > pulmonary f/u is prn  Each maintenance medication was reviewed in detail including most importantly the difference between maintenance and as needed and under what circumstances the prns are to be used.  Please see AVS for specific  Instructions which are unique to this visit and I personally typed out  which were reviewed in detail in writing with the patient and a copy provided.

## 2017-10-11 NOTE — Assessment & Plan Note (Addendum)
F/u per Dr Halford Chessman rec

## 2017-10-17 ENCOUNTER — Telehealth: Payer: Self-pay | Admitting: Endocrinology

## 2017-10-17 ENCOUNTER — Other Ambulatory Visit: Payer: Self-pay

## 2017-10-17 MED ORDER — GLUCOSE BLOOD VI STRP: ORAL_STRIP | 5 refills | Status: DC

## 2017-10-17 MED ORDER — ONETOUCH VERIO IQ SYSTEM W/DEVICE KIT: PACK | 0 refills | Status: DC

## 2017-10-17 NOTE — Telephone Encounter (Signed)
Patient stated she need a new meter, she has a one touch meter and every time she test it says error. She wants to know can she get a new one. Please advise

## 2017-10-17 NOTE — Telephone Encounter (Signed)
Sent in a new onetouch meter.

## 2017-10-26 ENCOUNTER — Ambulatory Visit (HOSPITAL_COMMUNITY)
Admission: RE | Admit: 2017-10-26 | Discharge: 2017-10-26 | Disposition: A | Discharge status: Home / Self Care | Payer: Medicare Other | Source: Ambulatory Visit | Attending: Internal Medicine | Admitting: Internal Medicine

## 2017-10-26 VITALS — BP 144/68 | Wt 186.8 lb

## 2017-10-26 DIAGNOSIS — I481 Persistent atrial fibrillation: Secondary | ICD-10-CM | POA: Diagnosis not present

## 2017-10-26 DIAGNOSIS — I272 Pulmonary hypertension, unspecified: Secondary | ICD-10-CM | POA: Diagnosis not present

## 2017-10-26 DIAGNOSIS — Z7901 Long term (current) use of anticoagulants: Secondary | ICD-10-CM | POA: Diagnosis not present

## 2017-10-26 DIAGNOSIS — I5032 Chronic diastolic (congestive) heart failure: Secondary | ICD-10-CM | POA: Diagnosis not present

## 2017-10-26 DIAGNOSIS — Z880 Allergy status to penicillin: Secondary | ICD-10-CM | POA: Insufficient documentation

## 2017-10-26 DIAGNOSIS — I5042 Chronic combined systolic (congestive) and diastolic (congestive) heart failure: Secondary | ICD-10-CM | POA: Diagnosis not present

## 2017-10-26 DIAGNOSIS — G4733 Obstructive sleep apnea (adult) (pediatric): Secondary | ICD-10-CM | POA: Diagnosis not present

## 2017-10-26 DIAGNOSIS — I482 Chronic atrial fibrillation, unspecified: Secondary | ICD-10-CM

## 2017-10-26 DIAGNOSIS — I4892 Unspecified atrial flutter: Secondary | ICD-10-CM | POA: Diagnosis not present

## 2017-10-26 DIAGNOSIS — Z9889 Other specified postprocedural states: Secondary | ICD-10-CM | POA: Insufficient documentation

## 2017-10-26 DIAGNOSIS — R233 Spontaneous ecchymoses: Secondary | ICD-10-CM | POA: Insufficient documentation

## 2017-10-26 DIAGNOSIS — M1990 Unspecified osteoarthritis, unspecified site: Secondary | ICD-10-CM | POA: Diagnosis not present

## 2017-10-26 DIAGNOSIS — Z91013 Allergy to seafood: Secondary | ICD-10-CM | POA: Diagnosis not present

## 2017-10-26 DIAGNOSIS — I2510 Atherosclerotic heart disease of native coronary artery without angina pectoris: Secondary | ICD-10-CM | POA: Diagnosis not present

## 2017-10-26 DIAGNOSIS — Z885 Allergy status to narcotic agent status: Secondary | ICD-10-CM | POA: Diagnosis not present

## 2017-10-26 DIAGNOSIS — Z794 Long term (current) use of insulin: Secondary | ICD-10-CM | POA: Diagnosis not present

## 2017-10-26 DIAGNOSIS — R21 Rash and other nonspecific skin eruption: Secondary | ICD-10-CM

## 2017-10-26 DIAGNOSIS — Z881 Allergy status to other antibiotic agents status: Secondary | ICD-10-CM | POA: Insufficient documentation

## 2017-10-26 DIAGNOSIS — I10 Essential (primary) hypertension: Secondary | ICD-10-CM | POA: Diagnosis not present

## 2017-10-26 DIAGNOSIS — Z79899 Other long term (current) drug therapy: Secondary | ICD-10-CM | POA: Insufficient documentation

## 2017-10-26 DIAGNOSIS — J441 Chronic obstructive pulmonary disease with (acute) exacerbation: Secondary | ICD-10-CM | POA: Diagnosis not present

## 2017-10-26 DIAGNOSIS — I110 Hypertensive heart disease with heart failure: Secondary | ICD-10-CM | POA: Diagnosis not present

## 2017-10-26 DIAGNOSIS — Z888 Allergy status to other drugs, medicaments and biological substances status: Secondary | ICD-10-CM | POA: Insufficient documentation

## 2017-10-26 LAB — BASIC METABOLIC PANEL
Anion gap: 6 (ref 5–15)
BUN: 21 mg/dL — ABNORMAL HIGH (ref 6–20)
CHLORIDE: 103 mmol/L (ref 101–111)
CO2: 33 mmol/L — ABNORMAL HIGH (ref 22–32)
CREATININE: 1.04 mg/dL — AB (ref 0.44–1.00)
Calcium: 9.6 mg/dL (ref 8.9–10.3)
GFR calc Af Amer: 56 mL/min — ABNORMAL LOW (ref >60)
GFR calc non Af Amer: 48 mL/min — ABNORMAL LOW (ref >60)
Glucose, Bld: 194 mg/dL — ABNORMAL HIGH (ref 65–99)
POTASSIUM: 4.3 mmol/L (ref 3.5–5.1)
SODIUM: 142 mmol/L (ref 135–145)

## 2017-10-26 LAB — CBC
HEMATOCRIT: 43.4 % (ref 36.0–46.0)
Hemoglobin: 13.4 g/dL (ref 12.0–15.0)
MCH: 29.3 pg (ref 26.0–34.0)
MCHC: 30.9 g/dL (ref 30.0–36.0)
MCV: 95 fL (ref 78.0–100.0)
PLATELETS: 178 10*3/uL (ref 150–400)
RBC: 4.57 MIL/uL (ref 3.87–5.11)
RDW: 14.1 % (ref 11.5–15.5)
WBC: 6 10*3/uL (ref 4.0–10.5)

## 2017-10-26 LAB — BRAIN NATRIURETIC PEPTIDE: B NATRIURETIC PEPTIDE 5: 274.9 pg/mL — AB (ref 0.0–100.0)

## 2017-10-26 NOTE — Progress Notes (Signed)
    ReDS Vest - 10/26/17 0900      ReDS Vest   MR  No   Fitting Posture Standing   Height Marker Short   Ruler Value 10   Center Strip Aligned   ReDS Value 29

## 2017-10-26 NOTE — Patient Instructions (Signed)
Labs today (will call for abnormal results, otherwise no news is good news)  Echocardiogram has been ordered for you, they will contact you to schedule your appointment.   Please follow up with your dermatologist regarding your rash.   We will call you to schedule your follow up in 3 months.  If you have not heard from [place] in January please call us @ [phone], Opt [phone].

## 2017-10-26 NOTE — Progress Notes (Signed)
Patient ID: [name], female   DOB: 06/05/1931, 83 y.o.   MRN: [id number]    Advanced Heart Failure Clinic Note   Patient ID: [name], female   DOB: May 18, 1931, 83 y.o.   MRN: [id number]  Referring Physician: Lovena Le Primary Care: Billey Gosling Primary Cardiologist:Taylor  HPI: [name] is a pleasant 83 yo woman with CAD, HTN, atrial fibrillation/A flutter and chronic prior systolic heart failure, (which was likely rate related) and diastolic heart failure. She was referred by Dr. Lovena Le for further evaluation of Pulmonary HTN.   Underwent 2D echo in 3/15 which showed EF 60-65% mild LVH. RV was normal. No significant valvular abnormalities.   Underwent R/L cath 05/07/14 Which showed stable CAD and significant PH  RA 6 RV 71/7 6 PA 63/17 (33)  PCWP 19 mm Hg  LV 174/16 mm Hg  AO 171/62 mean 103 mm Hg  PVR = 2.86 Oxygen saturations:  PA 69%  AO 99%  Cardiac Output/Index (Fick) 4.88/2.7    Left mainstem: Normal.  Left anterior descending (LAD): Moderate calcification proximally. Mild irregularities less than 10%. The first diagonal has 40-50% ostial disease.  Left circumflex (LCx): The LCx gives rise to a large OM1 then terminates in the AV groove. There is 20% disease in the proximal LCx. The first OM stent is patent with diffuse 20% disease.  Right coronary artery (RCA): The RCA arises anteriorly. There is an eccentric slit like stenosis in the proximal vessel to 60-70%. The mid vessel has segmental 70-80% disease.  Left ventriculography: Left ventricular systolic function is normal, LVEF is estimated at 55-65%, there is no significant mitral regurgitation   We saw her for an initial visit in May 2105 for pHTN and DOE. At that time she was quite dyspneic with activity. HR was in 40-50 range on carvedilol 50 bid and digoxin. We stopped her digoxin and cut carvedilol back to 25 bid. PFTs, VQ and CXR were ordered. I also asked her to decrease her H2O intake. We saw her  back last month and she was feeling much better. Given results of PFTs overnight oximeter was placed and referred to Dr. Gwenette Greet. She has had overnight oximetry which showed oxygen desaturation as low as 83%, and spent 97 minutes less than 88% during the night.   Admitted 05/21/17-05/27/17 with COPD exacerbation, acute on chronic diastolic CHF. Diuresed with 5L with IV Lasix. Discharge weight 173 pounds.  She presents today for regular follow up. Weight up 11 lbs since last visit in July. Has been more SOB. SOB changing clothes and bathing. Weight at home 176-178. Watching her salt and fluid more closely. She has been quadrupling her lasix once or twice a week. (Taking 160 BID instead of 80 mg DAILY). [facility] but otherwise not very active. No fevers, chills, chest pains, or palpitations. Denies bleeding on Eliquis. Denies dizziness and lightheadedness.   Results:  05/21/14: VQ normal 5/15: CXR: 3/16: Echo 3/16 EF 60-65% RV normal. Mild TR No RVSP measured  5/15: PFTs FEV1  1.4 (95%) FVC    1.89 (110%) FEV/FVC 88% FEF 25-75%  66% DLCO 39%  BP rest: 138/58 BP peak: 182/66 Peak VO2: 8.9 (69.1% predicted peak VO2) VE/VCO2 slope: 34.6 OUES: Peak RER: 1.24 Ventilatory Threshold: 5.8 (50% predicted and 65% measured peak VO2) Peak RR 38 Peak Ventilation: 32.7 VE/MVV: 71%  PETCO2 at peak: 36 O2pulse: 8 (100% predicted O2pulse)   Current Outpatient Prescriptions  Medication Sig Dispense Refill  .  acetaminophen (TYLENOL) 500 MG tablet Take 1,000 mg by mouth every 6 (six) hours as needed (pain).    . Blood Glucose Monitoring Suppl (ONETOUCH VERIO IQ SYSTEM) w/Device KIT Use to check sugar twice daily 1 kit 0  . CALCIUM-MAG-VIT C-VIT D PO Take 1 tablet by mouth daily.    . carvedilol (COREG) 12.5 MG tablet Take 1 tablet (12.5 mg total) by mouth 2 (two) times daily with a meal. 180 tablet 3  . cholecalciferol (VITAMIN D) 1000 units tablet Take 1,000 Units by mouth daily after  supper.    . clotrimazole (LOTRIMIN) 1 % cream Apply 1 application topically 2 (two) times daily. To inner thighs (Patient taking differently: Apply 1 application topically 2 (two) times daily as needed (rash). To inner thighs) 30 g 0  . dabigatran (PRADAXA) 150 MG CAPS capsule Take 1 capsule (150 mg total) by mouth 2 (two) times daily. 180 capsule 3  . diclofenac sodium (VOLTAREN) 1 % GEL Apply 1 application topically 2 (two) times daily as needed (knee pain).     . furosemide (LASIX) 40 MG tablet Take 2 tablets (80 mg total) by mouth daily. 180 tablet 3  . gabapentin (NEURONTIN) 100 MG capsule Take 3 capsules (300 mg total) by mouth at bedtime. 270 capsule 3  . glucose blood (ONETOUCH VERIO) test strip Use as instructed to check sugar 2 times daily 200 each 5  . insulin NPH Human (HUMULIN N,NOVOLIN N) 100 UNIT/ML injection Inject 0.4 mLs (40 Units total) into the skin daily before breakfast. And syringes 1/day 20 mL 11  . Lancets (ONETOUCH ULTRASOFT) lancets Use to check blood sugar 2 times per day. Dx Code E11.9 200 each 2  . methimazole (TAPAZOLE) 5 MG tablet Take 5 mg by mouth 2 (two) times a week. Monday and Friday    . Multiple Vitamin (MULTIVITAMIN WITH MINERALS) TABS tablet Take 1 tablet by mouth daily. Centrum Silver    . nitroGLYCERIN (NITROSTAT) 0.4 MG SL tablet Place 0.4 mg under the tongue every 5 (five) minutes as needed for chest pain. Reported on 01/28/2016    . potassium chloride SA (K-DUR,KLOR-CON) 20 MEQ tablet Take 1 tablet (84mq) daily every other day alternating with 2 tablets (4100m) daily every other day (Patient taking differently: Take 20-40 mEq by mouth See admin instructions. Take 1 tablet (2073m daily every other day alternating with 2 tablets (53m17mdaily every other day) 135 tablet 3  . Turmeric 500 MG CAPS Take 500 mg by mouth daily after supper.      No current facility-administered medications for this encounter.     Allergies  Allergen Reactions  . Amlodipine  Besylate Other (See Comments)    REACTION: tingling in lips & gum edema  . Levaquin [Levofloxacin] Shortness Of Breath and Swelling    angioedema  . Lobster [Shellfish Allergy] Other (See Comments)    angioedema  . Penicillins Rash    Has patient had a PCN reaction causing immediate rash, facial/tongue/throat swelling, SOB or lightheadedness with hypotension: Yes Has patient had a PCN reaction causing severe rash involving mucus membranes or skin necrosis: No Has patient had a PCN reaction that required hospitalization: No Has patient had a PCN reaction occurring within the last 10 years: No If all of the above answers are "NO", then may proceed with Cephalosporin use.  . Valsartan Other (See Comments)    REACTION: angioedema  . Codeine Other (See Comments)    Mental status changes  . Lipitor [Atorvastatin] Other (See  Comments)    weakness  . Oxycodone Other (See Comments)    hallucinations  . Statins     Made her too weak  . Tramadol Hcl Nausea And Vomiting   PHYSICAL EXAM: Vitals:   10/26/17 0916  BP: (!) 144/68  Weight: 186 lb 12.8 oz (84.7 kg)   Wt Readings from Last 3 Encounters:  10/26/17 186 lb 12.8 oz (84.7 kg)  10/10/17 184 lb 6.4 oz (83.6 kg)  08/23/17 181 lb 6.4 oz (82.3 kg)   General: Fatigued. Dyspneic with exertion.  HEENT: Normal Neck: Supple. JVP difficult with body habitus - does not appear elevated. Carotids 2+ bilat; no bruits. No thyromegaly or nodule noted. Cor: PMI nondisplaced. RRR, No M/G/R noted Lungs: CTAB  Abdomen: Obese Soft, non-tender, non-distended, no HSM. No bruits or masses. +BS  Extremities: No cyanosis, clubbing, or rash. R and LLE no edema.  Petechial like rash on bilateral LEs.  Neuro: Alert & orientedx3, cranial nerves grossly intact. moves all 4 extremities w/o difficulty. Affect pleasant   ECG: AF 51 bpm Personally reviewed  ASSESSMENT & PLAN: 1) Chronic diastolic CHF: Echo 01/9936 EF 60-65%, LA severely dilated.  - NYHA  III-IIIb - Volume status looks OK on exam and ReDS vest 29%.  - Continue Lasix 80 mg daily. BMET today. Can take extra 80 mg in the evening as needed. Recommended against taking 160 mg BID.   - Continue KCl supplementation. BMET today.  - Repeat Echo. Can consider RHC if EF low or symptoms do not improve.  - Recommend compression hose.  - Reinforced fluid restriction to < 2 L daily, sodium restriction to less than 2000 mg daily, and the importance of daily weights.   - Needs to increase activity.  2) Persistent atrial fib/flutter    This patients CHA2DS2-VASc Score and unadjusted Ischemic Stroke Rate (% per year) is equal to 7.2 % stroke rate/year from a score of 5 Above score calculated as 2 points each if present [Age > 75, or Stroke/TIA/TE] - Now on pradaxa for [facility].  - Denies bleeding.   3) OSA - Follows with Dr. Halford Chessman.  - Continue nightly CPAP.   4) HTN - Mildly elevated in clinic.   5) Petechial rash - Will send to dermatology to look for possible leukocytoclastic vasculitis. Will need biopsy. - Sees Dr. Ronnald Ramp with [facility] dermatology.  6. Arthritis - Limits her activity  - Large contributor to her deconditioning. Encouraged increased activity as tolerated.  Cortny Friar, PA-C  9:33 AM   Patient seen and examined with the above-signed Advanced Practice Provider and/or Housestaff. I personally reviewed laboratory data, imaging studies and relevant notes. I independently examined the patient and formulated the important aspects of the plan. I have edited the note to reflect any of my changes or salient points. I have personally discussed the plan with the patient and/or family.  She remains very SOB but no evidence of volume overload on exam or by ReDS. Will check echo. On ECG has persistent AF now with slow VR. Will cut carvedilol in half. I symptoms persist may need RHC. Encouraged more activity. Send to Derm for biopsy of skin rash. ? Leukocytoclastic vasculopathy  vs purpura -type rash.   Glori Bickers, MD  7:03 PM

## 2017-10-27 ENCOUNTER — Telehealth (HOSPITAL_COMMUNITY): Payer: Self-pay | Admitting: *Deleted

## 2017-10-27 DIAGNOSIS — L817 Pigmented purpuric dermatosis: Secondary | ICD-10-CM | POA: Diagnosis not present

## 2017-10-27 DIAGNOSIS — L920 Granuloma annulare: Secondary | ICD-10-CM | POA: Diagnosis not present

## 2017-10-27 NOTE — Telephone Encounter (Signed)
Advanced Heart Failure Triage Encounter  Patient Name: [name]  Date of Call: 10/27/17  Problem:   Patient called triage line letting us know that she went to her dermatologist appointment today and the rash on her feet was nothing concerning, they would be faxxing over the report to our clinic.  She has decided that she wants to switch back to Eliquis and stop pradaxa.    Plan:  I will forward to Dr. Haroldine Laws and once he says its fine for her to go back to Eliquis I will call and let her know.    Darron Doom, RN

## 2017-10-28 MED ORDER — CARVEDILOL 12.5 MG PO TABS
6.2500 mg | ORAL_TABLET | Freq: Two times a day (BID) | ORAL | 3 refills | Status: DC
Start: 2017-10-28 — End: 2018-04-17

## 2017-10-28 NOTE — Telephone Encounter (Signed)
-----   Message from Jolaine Artist, MD sent at 10/26/2017  7:04 PM EDT ----- Can you please call her and ask her to cut carvedilol down to 6.25 bid?  thanks

## 2017-10-28 NOTE — Progress Notes (Signed)
Pt aware and agreeable.  

## 2017-10-31 ENCOUNTER — Ambulatory Visit (HOSPITAL_COMMUNITY): Payer: Medicare Other | Attending: Cardiology

## 2017-10-31 ENCOUNTER — Other Ambulatory Visit: Payer: Self-pay

## 2017-10-31 DIAGNOSIS — I051 Rheumatic mitral insufficiency: Secondary | ICD-10-CM | POA: Insufficient documentation

## 2017-10-31 DIAGNOSIS — I5032 Chronic diastolic (congestive) heart failure: Secondary | ICD-10-CM | POA: Diagnosis not present

## 2017-10-31 DIAGNOSIS — I420 Dilated cardiomyopathy: Secondary | ICD-10-CM | POA: Diagnosis not present

## 2017-11-01 ENCOUNTER — Other Ambulatory Visit: Payer: Self-pay | Admitting: Pharmacist

## 2017-11-01 ENCOUNTER — Telehealth (HOSPITAL_COMMUNITY): Payer: Self-pay

## 2017-11-01 ENCOUNTER — Encounter: Payer: Self-pay | Admitting: Pharmacist

## 2017-11-01 NOTE — Telephone Encounter (Signed)
Stable echo results reviewed with patient. Patient also would like to know direction on anticoag regimen. Currently on Pradaxa, ordered 150 mg BID but recently decreased to once daily after increased issues with bruising and broken blood vessels after switching from eliquis to pradaxa. Patient reports some improvement since decreasing to 150 mg once daily. Patient would like to know if she can stay on 150 mg once daily, or if she needs to be on twice daily if the increased bruising is safe. Patient also wondering whether staying on pradaxa or going back on eliquis is safer choice for her. Will forward to Dr. Haroldine Laws to advise.

## 2017-11-01 NOTE — Patient Outreach (Addendum)
[facility]) Care Management  [place]   11/01/2017  [name] 02-03-1931 [id number]  Subjective: Called the patient to follow up on reordering Pradaxa. HIPAA identifiers were obtained.  Patient is an 83 year old female with multiple medical conditions including but not limited to: Hypertension, COPD, PVD, GERD, depression, hyperlipidemia, chronic knee pain,  osteoporosis and atrial flutter.    Patient was previously approved to receive Pradaxa '150mg'$  tablets until 12/26/2017 through the [place] Patient Assistance Program.Patient confirmed [facility] called her about refilling Pradaxa and she has received her final three month supply for the year.  Patient reported her dermatologist was concerned about Pradaxa causing a "measle like" rash on her feet.    Patient placed a call to cardiology to inquire.  She cannot afford Eliquis and has decided to continue to take Pradaxa but has only been taking 1 tablet daily versus two tablets.  Patient said she let the nurse at cardiology know.  Since she has had increased shortness of breath, patient wondered if she could restart Spiriva.  She reported stopping Spiriva years ago due to cost.  Patient said she is going to ask Dr. Melvyn Novas. If she is able to restart Spiriva, she would like to see if she can get it from a [facility] as well.   Objective:   Encounter Medications: Outpatient Encounter Medications as of 11/01/2017  Medication Sig Note  . acetaminophen (TYLENOL) 500 MG tablet Take 1,000 mg by mouth every 6 (six) hours as needed (pain).   . Blood Glucose Monitoring Suppl (ONETOUCH VERIO IQ SYSTEM) w/Device KIT Use to check sugar twice daily   . CALCIUM-MAG-VIT C-VIT D PO Take 1 tablet by mouth daily.   . carvedilol (COREG) 12.5 MG tablet Take 0.5 tablets (6.25 mg total) by mouth 2 (two) times daily with a meal.   . cholecalciferol (VITAMIN D) 1000 units tablet Take 1,000 Units by  mouth daily after supper.   . clotrimazole (LOTRIMIN) 1 % cream Apply 1 application topically 2 (two) times daily. To inner thighs (Patient taking differently: Apply 1 application topically 2 (two) times daily as needed (rash). To inner thighs)   . dabigatran (PRADAXA) 150 MG CAPS capsule Take 1 capsule (150 mg total) by mouth 2 (two) times daily.   . diclofenac sodium (VOLTAREN) 1 % GEL Apply 1 application topically 2 (two) times daily as needed (knee pain).    . furosemide (LASIX) 40 MG tablet Take 2 tablets (80 mg total) by mouth daily.   Marland Kitchen gabapentin (NEURONTIN) 100 MG capsule Take 3 capsules (300 mg total) by mouth at bedtime.   Marland Kitchen glucose blood (ONETOUCH VERIO) test strip Use as instructed to check sugar 2 times daily   . insulin NPH Human (HUMULIN N,NOVOLIN N) 100 UNIT/ML injection Inject 0.4 mLs (40 Units total) into the skin daily before breakfast. And syringes 1/day   . Lancets (ONETOUCH ULTRASOFT) lancets Use to check blood sugar 2 times per day. Dx Code E11.9   . methimazole (TAPAZOLE) 5 MG tablet Take 5 mg by mouth 2 (two) times a week. Monday and Friday   . Multiple Vitamin (MULTIVITAMIN WITH MINERALS) TABS tablet Take 1 tablet by mouth daily. Centrum Silver   . nitroGLYCERIN (NITROSTAT) 0.4 MG SL tablet Place 0.4 mg under the tongue every 5 (five) minutes as needed for chest pain. Reported on 01/28/2016   . potassium chloride SA (K-DUR,KLOR-CON) 20 MEQ tablet Take 1 tablet (39mq) daily every other day alternating with  2 tablets (69mq) daily every other day (Patient taking differently: Take 20-40 mEq by mouth See admin instructions. Take 1 tablet (256m) daily every other day alternating with 2 tablets (4046m daily every other day) 05/21/2017: 1 tablet today  . Turmeric 500 MG CAPS Take 500 mg by mouth daily after supper.     No facility-administered encounter medications on file as of 11/01/2017.     Functional Status: In your present state of health, do you have any difficulty  performing the following activities: 05/22/2017 02/07/2017  Hearing? N N  Vision? N N  Difficulty concentrating or making decisions? N N  Walking or climbing stairs? Y Y  Comment - orthopedic issues hip and knee  Dressing or bathing? N N  Doing errands, shopping? Y N  Preparing Food and eating ? - N  Using the Toilet? - N  In the past six months, have you accidently leaked urine? - Y  Comment - uses alsways pads. Unable to control urine.   Do you have problems with loss of bowel control? - N  Managing your Medications? - N  Managing your Finances? - N  Housekeeping or managing your Housekeeping? - N  Some recent data might be hidden    Fall/Depression Screening: Fall Risk  06/06/2017 02/07/2017 01/27/2017  Falls in the past year? No Yes Yes  Number falls in past yr: - - 1  Injury with Fall? - No No  Comment - - -  Risk for fall due to : History of fall(s) Impaired balance/gait;Impaired mobility;Impaired vision -  Risk for fall due to: Comment - Fall prevention education provided  -   [facility] Sc[facility] 11/01/2017 06/06/2017 02/07/2017 01/27/2017 08/22/2014 07/19/2013  PHQ - 2 Score 0 0 0 0 0 0   Today's Vitals   11/01/17 1436  PainSc: 6        Assessment: Patient's medications were reviewed via telephone:   Drugs sorted by system:  Neurologic/Psychologic: Gabapentin  Cardiovascular: Carvedilol Pradaxa Nitroglycerin Furosemide  Pulmonary/Allergy:  Endocrine: Methimazole Novolin N   Topical: Clotrimazole  Pain: Acetaminophen  Vitamins/Minerals: Multiple Vitamin Turmeric Potassium Chloride Cholecalciferol  Patient was seen in the heart failure clinic 10/27/17 and did not have signs of volume overload despite worsening SOB. Patient wondered if she could go back on Spiriva. She stopped it a few years ago due to cost. Patient was instructed that since Spiriva was not on her medication list, a note would have to be sent to Dr. WerMelvyn Novas discuss. [employer] called on the patThe First AmericanProvider does not need to complete a new application, he/she only has to fax a new prescription to the [employer] Assistance Program with a cover sheet and the patient's demographic information.  Plan: Route note to PCP Route note to Dr. WerMelvyn Novasllow up with the patient in 5-7 business days about the Spiriva Application if Dr. WerMelvyn Novas willing to prescribe the Spiriva and sign new forms. [phone] a fax cover sheet with the patient's name, date of birth and address.  KatElayne GuerinharmD, BCATaconiteinical Pharmacist ([phone]

## 2017-11-01 NOTE — Telephone Encounter (Signed)
Pradaxa is a bid drug not daily. I favor eliquis 5 bid (if creatinine less than 1.5)

## 2017-11-02 ENCOUNTER — Telehealth: Payer: Self-pay | Admitting: Pharmacist

## 2017-11-02 MED ORDER — APIXABAN 5 MG PO TABS: 5.0000 mg | ORAL_TABLET | Freq: Two times a day (BID) | ORAL | 3 refills | Status: DC

## 2017-11-02 NOTE — Telephone Encounter (Addendum)
Patient would like to switch back to Eliquis 5 mg BID (most recent Cr 1.04 10/30). eliquis ordered through mail order pharmacy, med list updated in chart, and samples of eliquis left at front desk for patient to pick up today. Advised patient to continue pradaxa 150 mg BID until she receives eliquis.  [name], RN

## 2017-11-02 NOTE — Telephone Encounter (Signed)
-----   Message from Tanda Rockers, MD sent at 11/01/2017  5:18 PM EST ----- No indication for spiriva by last ov > refer back to pulmonary prn   thx   ----- Message ----- From: Elayne Guerin, [facility] Sent: 11/01/2017   2:55 PM To: Tanda Rockers, MD  Dr. Melvyn Novas,  Please see the attached [facility] Pharmacist's note. A phone follow-up visit was conducted with Ms. Captain today about another medication and she mentioned wanting to re-start Spiriva and get if from the [place] Patient Assistance Program. Patient was educated that Spiriva was not on her current medication list. Review of the patient's chart shows Spiriva was discontinued 06/02/17 and Symbicort was started.  Patient was unable to afford Symbicort so a patient assistance application was completed for the patient over the summer to receive Dulera from [employer].  Patient was approved but then said she was told she no longer needed [facility] because her breathing was better.  Patient visited the Heart Failure clinic 10/27/17 with complaints of SOB and her carvedilol dose was cut in half.  Patient is already receiving Pradaxa from [employer].  As such, a new application does not have to be completed for her to receive Spiriva this year. If if is deemed therapeutically appropriate for the patient to restart Spiriva, please have your fax a new prescription with a cover sheet containing the patient's name, birthday and address to:  [phone].  Thank you so much for your time and consideration.   Blessings,  Elayne Guerin, PharmD, [place] Clinical Pharmacist [phone]

## 2017-11-02 NOTE — Patient Outreach (Signed)
[facility]) Care Management  11/02/2017  [name] [birth year] [id number]   Patient was called back to let her know Dr. Melvyn Novas said [name] was not indicated.  HIPAA identifiers were obtained.  Plan: Follow up with patient in January of 2019 to complete new Pradaxa forms.  Elayne Guerin, PharmD, [place] Clinical Pharmacist [phone]

## 2017-11-04 ENCOUNTER — Ambulatory Visit: Payer: Medicare Other | Admitting: Pharmacist

## 2017-11-07 ENCOUNTER — Ambulatory Visit: Payer: Self-pay | Admitting: Pharmacist

## 2017-11-22 ENCOUNTER — Encounter: Payer: Self-pay | Admitting: Pulmonary Disease

## 2017-11-22 ENCOUNTER — Ambulatory Visit (INDEPENDENT_AMBULATORY_CARE_PROVIDER_SITE_OTHER): Payer: Medicare Other | Admitting: Pulmonary Disease

## 2017-11-22 VITALS — BP 126/80 | HR 94 | Ht 62.0 in | Wt 177.8 lb

## 2017-11-22 DIAGNOSIS — G4733 Obstructive sleep apnea (adult) (pediatric): Secondary | ICD-10-CM | POA: Diagnosis not present

## 2017-11-22 DIAGNOSIS — Z9989 Dependence on other enabling machines and devices: Secondary | ICD-10-CM | POA: Diagnosis not present

## 2017-11-22 DIAGNOSIS — J432 Centrilobular emphysema: Secondary | ICD-10-CM | POA: Diagnosis not present

## 2017-11-22 MED ORDER — UMECLIDINIUM BROMIDE 62.5 MCG/INH IN AEPB: 1.0000 | INHALATION_SPRAY | Freq: Every day | RESPIRATORY_TRACT | 5 refills | Status: DC

## 2017-11-22 NOTE — Progress Notes (Signed)
Current Outpatient Medications on File Prior to Visit  Medication Sig  . acetaminophen (TYLENOL) 500 MG tablet Take 1,000 mg by mouth every 6 (six) hours as needed (pain).  Marland Kitchen apixaban (ELIQUIS) 5 MG TABS tablet Take 1 tablet (5 mg total) 2 (two) times daily by mouth.  . Blood Glucose Monitoring Suppl (ONETOUCH VERIO IQ SYSTEM) w/Device KIT Use to check sugar twice daily  . CALCIUM-MAG-VIT C-VIT D PO Take 1 tablet by mouth daily.  . carvedilol (COREG) 12.5 MG tablet Take 0.5 tablets (6.25 mg total) by mouth 2 (two) times daily with a meal.  . cholecalciferol (VITAMIN D) 1000 units tablet Take 1,000 Units by mouth daily after supper.  . clotrimazole (LOTRIMIN) 1 % cream Apply 1 application topically 2 (two) times daily. To inner thighs (Patient taking differently: Apply 1 application topically 2 (two) times daily as needed (rash). To inner thighs)  . diclofenac sodium (VOLTAREN) 1 % GEL Apply 1 application topically 2 (two) times daily as needed (knee pain).   . furosemide (LASIX) 40 MG tablet Take 2 tablets (80 mg total) by mouth daily.  Marland Kitchen gabapentin (NEURONTIN) 100 MG capsule Take 3 capsules (300 mg total) by mouth at bedtime.  Marland Kitchen glucose blood (ONETOUCH VERIO) test strip Use as instructed to check sugar 2 times daily  . insulin NPH Human (HUMULIN N,NOVOLIN N) 100 UNIT/ML injection Inject 0.4 mLs (40 Units total) into the skin daily before breakfast. And syringes 1/day  . Lancets (ONETOUCH ULTRASOFT) lancets Use to check blood sugar 2 times per day. Dx Code E11.9  . methimazole (TAPAZOLE) 5 MG tablet Take 5 mg by mouth 2 (two) times a week. Monday and Friday  . Multiple Vitamin (MULTIVITAMIN WITH MINERALS) TABS tablet Take 1 tablet by mouth daily. Centrum Silver  . nitroGLYCERIN (NITROSTAT) 0.4 MG SL tablet Place 0.4 mg under the tongue every 5 (five) minutes as needed for chest pain. Reported on 01/28/2016  . potassium chloride SA (K-DUR,KLOR-CON) 20 MEQ tablet Take 1 tablet (1mq) daily every other  day alternating with 2 tablets (438m) daily every other day (Patient taking differently: Take 20-40 mEq by mouth See admin instructions. Take 1 tablet (2082m daily every other day alternating with 2 tablets (55m73mdaily every other day)  . Turmeric 500 MG CAPS Take 500 mg by mouth daily after supper.    No current facility-administered medications on file prior to visit.      Chief Complaint  Patient presents with  . Follow-up    Pt doesn't like the cpap mask at all, it is not comfortable and she feels she cannot sleep well with it. Pt has SOB with exertion.     Pulmonary tests PFT 05/21/14 >> FEV1 1.37 (94%), FEV1% 66, TLC 4.69 (95%), DLCO 39% CT chest 05/24/15 >> mild centrilobular emphysema CT chest 05/25/17 >> moderate centrilobular emphysema, atherosclerosis  Sleep tests PSG 7/14//15 >> AHI 37 HST 02/21/17 >> AHI 30.5, SaO2 low 78% Auto CPAP 10/23/17 to 11/21/17 >> used on 30 of 30 nights with average 6 hrs 34 min.  Average AHI 1 with median CPAP 8 and 95 th percentile CPAP 11 cm H2O  Cardiac tests Echo 10/31/17 >> EF 65 to 70%, grade 3 DD, mild MR  Past medical history A fib, CAD, HTN, HLD, Chronic diastolic dysfx, COPD, DM, Allergies  Past surgical history, Family history, Social history, Allergies reviewed  Vital Signs BP 126/80 (BP Location: Left Arm, Cuff Size: Normal)   Pulse 94   Ht 5' 2"  (  1.575 m)   Wt 177 lb 12.8 oz (80.6 kg)   SpO2 92%   BMI 32.52 kg/m   History of Present Illness [name] is a 83 y.o. female former smoker with obstructive sleep apnea and COPD with emphysema.  She was in hospital in May with pneumonia.  She has improved from this.  She was on spiriva and felt this helped.  She couldn't afford this, and hasn't been using.  She was on symbicort, but advised she didn't need this.  She still gets short of breath with activities.  She is not having cough, wheeze, or sputum.  She denies chest pain or leg swelling.  She uses CPAP  nightly.  This helps, but she doesn't like her mask.  She has been using a full face mask.  She has never tried other mask types.      Physical Exam  General - pleasant Eyes - pupils reactive ENT - no sinus tenderness, no oral exudate, no LAN Cardiac - regular, no murmur Chest - no wheeze, rales Abd - soft, non tender Ext - no edema Skin - no rashes Neuro - normal strength Psych - normal mood    Assessment/Plan  COPD with emphysema. - will try setting her up for incurse - asked to check her insurance formulary if she has trouble affording he inhaler  Obstructive sleep apnea. - she is compliant with CPAP and reports benefit - continue auto CPAP - will have her DME refit her CPAP mask   Patient Instructions  Will arrange for refitting of your CPAP mask  Incruse 1 puff daily >> call if you have trouble with insurance coverage for this  Follow up in 1 year   Chesley Mires, MD [place] Pager:  [phone] 11/22/2017, 3:14 PM

## 2017-11-22 NOTE — Patient Instructions (Signed)
Will arrange for refitting of your CPAP mask  Incruse 1 puff daily >> call if you have trouble with insurance coverage for this  Follow up in 1 year

## 2017-11-23 ENCOUNTER — Ambulatory Visit (INDEPENDENT_AMBULATORY_CARE_PROVIDER_SITE_OTHER): Payer: Medicare Other | Admitting: Endocrinology

## 2017-11-23 ENCOUNTER — Encounter: Payer: Self-pay | Admitting: Endocrinology

## 2017-11-23 VITALS — BP 152/74 | HR 90 | Wt 178.0 lb

## 2017-11-23 DIAGNOSIS — N183 Chronic kidney disease, stage 3 unspecified: Secondary | ICD-10-CM

## 2017-11-23 DIAGNOSIS — Z794 Long term (current) use of insulin: Secondary | ICD-10-CM

## 2017-11-23 DIAGNOSIS — E1122 Type 2 diabetes mellitus with diabetic chronic kidney disease: Secondary | ICD-10-CM

## 2017-11-23 LAB — POCT GLYCOSYLATED HEMOGLOBIN (HGB A1C): HEMOGLOBIN A1C: 8.3

## 2017-11-23 MED ORDER — INSULIN NPH (HUMAN) (ISOPHANE) 100 UNIT/ML ~~LOC~~ SUSP: 45.0000 [IU] | Freq: Every day | SUBCUTANEOUS | 11 refills | Status: DC

## 2017-11-23 NOTE — Patient Instructions (Addendum)
check your blood sugar twice a day.  vary the time of day when you check, between before the 3 meals, and at bedtime.  also check if you have symptoms of your blood sugar being too high or too low.  please keep a record of the readings and bring it to your next appointment here.  please call us sooner if your blood sugar goes below 70, or if you have a lot of readings over 200.     If ever you have fever while taking methimazole, stop it and call us, even if the reason is obvious, because of the risk of a rare side-effect.   Please increase the insulin to 45 units each morning.  However, if you are going to be active, take just 25 units.   On this type of insulin schedule, you should eat meals on a regular schedule.  If a meal is missed or significantly delayed, your blood sugar could go low.   Please come back for a follow-up appointment in 3 months.

## 2017-11-23 NOTE — Progress Notes (Signed)
Subjective:    Patient ID: [name], female    DOB: 12-May-1931, 83 y.o.   MRN: [id number]  HPI Pt returns for f/u of diabetes mellitus:  DM type: Insulin-requiring type 2.   Dx'ed: 4098 Complications: CAD, polyneuropathy, and renal insufficiency.  Therapy: insulin since 2007.  GDM: never.  DKA: never. Severe hypoglycemia: never.  Pancreatitis: never.  Other: she changed to qd insulin, after poor results with multiple daily injections; she was changed from lantus to QAM NPH, due to am hypoglycemia and PM hyperglycemia.   Interval history:  Pt says she never misses the insulin.  no cbg record, but states cbg's vary from 130-200.  It is in general higher as the day goes on.  Pt also has hyperthyroidism (dx'ed 2012; [place] then showed multinodular goiter; tapazole was chosen as rx, due to low uptake on nuc med scan).  Past Medical History:  Diagnosis Date  . Anemia    iron defficiency  . Anginal pain ([place])   . Arthritis    "knees, feet, hands; joints" (05/27/2015)  . Asthma   . Atrial fibrillation or flutter    s/p RFCA 7/08;   s/p DCCV in past;   previously on amiodarone;  amio stopped due to lung toxicity  . CAD (coronary artery disease)    s/p NSTEMI tx with BMS to OM1 3/08;  cath 3/08: pOM 99% tx with PCI, pLAD 20%, ? mod stenosis at the AM  . CAP (community acquired pneumonia) 05/24/2015  . Chronic diastolic heart failure (H[facility])    echo 11/11:  EF 55-60%, severe LVH, mod LAE, mild MR, mildly increased PASP  . COPD (chronic obstructive pulmonary disease) ([place])   . Degenerative joint disease   . Dystrophy, corneal stromal   . Heart murmur   . HLD (hyperlipidemia)   . HTN (hypertension)    essential nos  . Hypopotassemia    PMH of  . Muscle pain   . Myocardial infarction ([place]) 2008  . OSA on CPAP   . Osteoporosis   . Pneumonia 05/27/2015  . Protein calorie malnutrition ([place])   . Rash and nonspecific skin eruption    both arms,awaiting bio   dr Delman Cheadle  . Seasonal  allergies   . Shortness of breath dyspnea   . Type II diabetes mellitus ([place])    Dr Loanne Drilling    Past Surgical History:  Procedure Laterality Date  . A FLUTTER ABLATION     Dr Lovena Le  . CATARACT EXTRACTION W/ INTRAOCULAR LENS  IMPLANT, BILATERAL Bilateral   . CHOLECYSTECTOMY    . COLONOSCOPY  6/07   2 polyps Dr. Kinnie Feil in [place]  . colonoscopy with polypectomy      X 2; [facility]  . CORNEAL TRANSPLANT Bilateral   . CORONARY ANGIOPLASTY WITH STENT PLACEMENT  02/2007   BMS w/Dr Lovena Le  . EYE SURGERY    . gravid 2 para 2    . KNEE CARTILAGE SURGERY Right   . LEFT AND RIGHT HEART CATHETERIZATION WITH CORONARY ANGIOGRAM N/A 05/07/2014   Procedure: LEFT AND RIGHT HEART CATHETERIZATION WITH CORONARY ANGIOGRAM;  Surgeon: Peter M Martinique, MD;  Location: [facility] CATH LAB;  Service: Cardiovascular;  Laterality: N/A;  . TOTAL KNEE ARTHROPLASTY Right 06/28/2016   Procedure: TOTAL KNEE ARTHROPLASTY;  Surgeon: Frederik Pear, MD;  Location: [facility];  Service: Orthopedics;  Laterality: Right;    Social History   Socioeconomic History  . Marital status: Widowed    Spouse name: Not on file  .  Number of children: Not on file  . Years of education: Not on file  . Highest education level: Not on file  Social Needs  . Financial resource strain: Not on file  . Food insecurity - worry: Not on file  . Food insecurity - inability: Not on file  . Transportation needs - medical: Not on file  . Transportation needs - non-medical: Not on file  Occupational History  . Occupation: Pharmacist, hospital  Tobacco Use  . Smoking status: Former Smoker    Packs/day: 0.25    Years: 18.00    Pack years: 4.50    Types: Cigarettes    Last attempt to quit: 12/27/1968    Years since quitting: 48.9  . Smokeless tobacco: Never Used  . Tobacco comment: smoked [place], up to 1 pp week  Substance and Sexual Activity  . Alcohol use: Yes    Comment: occ  . Drug use: No  . Sexual activity: No  Other Topics Concern  . Not on file  Social  History Narrative   Teacher. Widowed. Rarely drinks cafeine.     Current Outpatient Medications on File Prior to Visit  Medication Sig Dispense Refill  . acetaminophen (TYLENOL) 500 MG tablet Take 1,000 mg by mouth every 6 (six) hours as needed (pain).    Marland Kitchen apixaban (ELIQUIS) 5 MG TABS tablet Take 1 tablet (5 mg total) 2 (two) times daily by mouth. 180 tablet 3  . Blood Glucose Monitoring Suppl (ONETOUCH VERIO IQ SYSTEM) w/Device KIT Use to check sugar twice daily 1 kit 0  . CALCIUM-MAG-VIT C-VIT D PO Take 1 tablet by mouth daily.    . carvedilol (COREG) 12.5 MG tablet Take 0.5 tablets (6.25 mg total) by mouth 2 (two) times daily with a meal. 45 tablet 3  . cholecalciferol (VITAMIN D) 1000 units tablet Take 1,000 Units by mouth daily after supper.    . clotrimazole (LOTRIMIN) 1 % cream Apply 1 application topically 2 (two) times daily. To inner thighs (Patient taking differently: Apply 1 application topically 2 (two) times daily as needed (rash). To inner thighs) 30 g 0  . diclofenac sodium (VOLTAREN) 1 % GEL Apply 1 application topically 2 (two) times daily as needed (knee pain).     . furosemide (LASIX) 40 MG tablet Take 2 tablets (80 mg total) by mouth daily. 180 tablet 3  . gabapentin (NEURONTIN) 100 MG capsule Take 3 capsules (300 mg total) by mouth at bedtime. 270 capsule 3  . glucose blood (ONETOUCH VERIO) test strip Use as instructed to check sugar 2 times daily 200 each 5  . Lancets (ONETOUCH ULTRASOFT) lancets Use to check blood sugar 2 times per day. Dx Code E11.9 200 each 2  . methimazole (TAPAZOLE) 5 MG tablet Take 5 mg by mouth 2 (two) times a week. Monday and Friday    . Multiple Vitamin (MULTIVITAMIN WITH MINERALS) TABS tablet Take 1 tablet by mouth daily. Centrum Silver    . nitroGLYCERIN (NITROSTAT) 0.4 MG SL tablet Place 0.4 mg under the tongue every 5 (five) minutes as needed for chest pain. Reported on 01/28/2016    . potassium chloride SA (K-DUR,KLOR-CON) 20 MEQ tablet Take 1  tablet (56mq) daily every other day alternating with 2 tablets (422m) daily every other day (Patient taking differently: Take 20-40 mEq by mouth See admin instructions. Take 1 tablet (2057m daily every other day alternating with 2 tablets (11m83mdaily every other day) 135 tablet 3  . Turmeric 500 MG CAPS Take 500  mg by mouth daily after supper.     . umeclidinium bromide (INCRUSE ELLIPTA) 62.5 MCG/INH AEPB Inhale 1 puff into the lungs daily. 1 each 5   No current facility-administered medications on file prior to visit.     Allergies  Allergen Reactions  . Amlodipine Besylate Other (See Comments)    REACTION: tingling in lips & gum edema  . Levaquin [Levofloxacin] Shortness Of Breath and Swelling    angioedema  . Lobster [Shellfish Allergy] Other (See Comments)    angioedema  . Penicillins Rash    Has patient had a PCN reaction causing immediate rash, facial/tongue/throat swelling, SOB or lightheadedness with hypotension: Yes Has patient had a PCN reaction causing severe rash involving mucus membranes or skin necrosis: No Has patient had a PCN reaction that required hospitalization: No Has patient had a PCN reaction occurring within the last 10 years: No If all of the above answers are "NO", then may proceed with Cephalosporin use.  . Valsartan Other (See Comments)    REACTION: angioedema  . Codeine Other (See Comments)    Mental status changes  . Lipitor [Atorvastatin] Other (See Comments)    weakness  . Oxycodone Other (See Comments)    hallucinations  . Statins     Made her too weak  . Tramadol Hcl Nausea And Vomiting    Family History  Problem Relation Age of Onset  . Diabetes Mother   . Hypertension Mother   . Transient ischemic attack Mother   . Arthritis Mother   . Heart attack Father 57  . Arthritis Father   . Hypertension Father   . Breast cancer Maternal Aunt   . Arthritis Maternal Aunt     BP (!) 152/74 (BP Location: Left Arm, Patient Position: Sitting,  Cuff Size: Normal)   Pulse 90   Wt 178 lb (80.7 kg)   SpO2 94%   BMI 32.56 kg/m     Review of Systems She denies hypoglycemia.      Objective:   Physical Exam VITAL SIGNS:  See vs page GENERAL: no distress Pulses: dorsalis pedis intact bilat.   MSK: no deformity of the feet CV: no leg edema Skin:  no ulcer on the feet.  normal color and temp on the feet. Neuro: sensation is intact to touch on the feet  Lab Results  Component Value Date   HGBA1C 8.3 11/23/2017       Assessment & Plan:  Insulin-requiring type 2 DM: worse  Patient Instructions  check your blood sugar twice a day.  vary the time of day when you check, between before the 3 meals, and at bedtime.  also check if you have symptoms of your blood sugar being too high or too low.  please keep a record of the readings and bring it to your next appointment here.  please call us sooner if your blood sugar goes below 70, or if you have a lot of readings over 200.     If ever you have fever while taking methimazole, stop it and call us, even if the reason is obvious, because of the risk of a rare side-effect.   Please increase the insulin to 45 units each morning.  However, if you are going to be active, take just 25 units.   On this type of insulin schedule, you should eat meals on a regular schedule.  If a meal is missed or significantly delayed, your blood sugar could go low.   Please come back for a  follow-up appointment in 3 months.

## 2017-12-12 ENCOUNTER — Encounter: Payer: Medicare Other | Attending: Internal Medicine | Admitting: Dietician

## 2017-12-12 ENCOUNTER — Encounter: Payer: Self-pay | Admitting: Dietician

## 2017-12-12 DIAGNOSIS — I129 Hypertensive chronic kidney disease with stage 1 through stage 4 chronic kidney disease, or unspecified chronic kidney disease: Secondary | ICD-10-CM | POA: Diagnosis not present

## 2017-12-12 DIAGNOSIS — E785 Hyperlipidemia, unspecified: Secondary | ICD-10-CM | POA: Insufficient documentation

## 2017-12-12 DIAGNOSIS — N183 Chronic kidney disease, stage 3 (moderate): Secondary | ICD-10-CM | POA: Insufficient documentation

## 2017-12-12 DIAGNOSIS — G4733 Obstructive sleep apnea (adult) (pediatric): Secondary | ICD-10-CM | POA: Diagnosis not present

## 2017-12-12 DIAGNOSIS — Z794 Long term (current) use of insulin: Secondary | ICD-10-CM

## 2017-12-12 DIAGNOSIS — J449 Chronic obstructive pulmonary disease, unspecified: Secondary | ICD-10-CM | POA: Diagnosis not present

## 2017-12-12 DIAGNOSIS — E1122 Type 2 diabetes mellitus with diabetic chronic kidney disease: Secondary | ICD-10-CM | POA: Insufficient documentation

## 2017-12-12 DIAGNOSIS — Z713 Dietary counseling and surveillance: Secondary | ICD-10-CM | POA: Diagnosis not present

## 2017-12-12 DIAGNOSIS — E119 Type 2 diabetes mellitus without complications: Secondary | ICD-10-CM | POA: Diagnosis not present

## 2017-12-12 NOTE — Progress Notes (Signed)
Medical Nutrition Therapy:  Appt start time: 1130 end time:  1230.   Assessment:  Primary concerns today: Patient is here today alone.  She would like to lose weight gained since last RD visit 1/29 2016.  She had followed up with Rise Paganini, RD, CDE several times related to her diabetes.  Since that time, she has had a knee replacement and pneumonia twice.  She states that inactivity has made her gain weight although she is less than her weight of 180 lbs 3 years ago.  Her diet overall is very healthy and she aims to follow a low sodium, carb modified diet.  Other history includes HTN, hyperlipidemia, COPD, OSA on C-pap and CKD stage 3.  Patient's son lives with her.  Patient does the shopping and cooking.  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  Preferred Learning Style:   No preference indicated   Learning Readiness:   Ready  Change in progress   MEDICATIONS: see list to include NPH 40-45 units each am.  She has been instructed to reduce the dose to 25 units if she is active.  She has not done this at this time as activity is not strenuous and she is not having low blood sugars.  She can now feel low blood sugars and treats them with OJ OR peppermints OR glucose tabs.  She states low blood sugars are very infrequent and caused by waiting too long to eat a meal or snack.   DIETARY INTAKE:  24-hr recall:  B (8:30-9:30 AM): smoothie (blueberries, banana, kale, almond milk, oatmeal) OR egg and [employer] toast, 1/2 strip bacon, 1/2 orange or apple AND coffee with almond milk and stevia  Snk ( AM): none OR occasional sunflower seeds  L (2-3 PM): "I don't care about this meal." Yogurt or boiled egg or 1/2 [place] sandwich with greens, "tad" of mayo or leftover chicken (without skin, baked) AND occasional 1/3 blueberry muffin or cake or pie Snk ( PM): none D (7-8 PM): poultry of seafood ([place] burger or meatloaf or plain [place] or salmon-stuffed, 1-2 vegetables and small salad with small amount olive  oil, balsamic vinegar and few drops of honey Occasional sweet potato with her meal. Snk ( PM): occasional peanut butter on cracker or LS swiss cheese "when I need it" Beverages: water, almond milk, tea with stevia  Usual physical activity: yoga 3 times per week (2 times per week in the water and once per week chair yoga),  Occasional elliptical.   She is doing exercises from her PT appointments daily.  Estimated energy needs: 1200 calories 135 g carbohydrates 90 g protein 33 g fat  Progress Towards Goal(s):  In progress.   Nutritional Diagnosis:  NB-1.1 Food and nutrition-related knowledge deficit As related to diabetes.  As evidenced by A1C of 8.3%.    Intervention:  Nutrition education related to diabetes.  Discussed her insulin and the effects of exercise on her blood sugar.  Discussed having a balance of protein and carbohydrate with meals. She would like follow up and suggested that she journal her intake.  She will continue to be active and encouraged this most days of the week.  Have small amounts of protein with each meal and snack. Be mindful about your nut portion. Continue to be mindful about your dessert intake. Listen to your body.  Stop eating when satisfied. Consider skim milk in your smoothie for protein. Consider journaling. Continue to stay active most day. (30 minutes most days)  Teaching  Method Utilized:  Visual Auditory Hands on  Handouts given during visit include:  Meal plan card  Label reading  Snack list  My plate  Barriers to learning/adherence to lifestyle change: none  Demonstrated degree of understanding via:  Teach Back   Monitoring/Evaluation:  Dietary intake, exercise, label reading, and body weight in 6 week(s).

## 2017-12-12 NOTE — Patient Instructions (Signed)
Have small amounts of protein with each meal and snack. Be mindful about your nut portion. Continue to be mindful about your dessert intake. Listen to your body.  Stop eating when satisfied. Consider skim milk in your smoothie for protein. Consider journaling. Continue to stay active most day. (30 minutes most days)

## 2017-12-27 ENCOUNTER — Other Ambulatory Visit (HOSPITAL_COMMUNITY): Payer: Self-pay | Admitting: Internal Medicine

## 2018-01-02 ENCOUNTER — Encounter: Payer: Self-pay | Admitting: Pharmacist

## 2018-01-02 ENCOUNTER — Other Ambulatory Visit: Payer: Self-pay | Admitting: Pharmacist

## 2018-01-02 NOTE — Patient Outreach (Addendum)
[facility]) Care Management  [place]   01/02/2018  [name] Sep 15, 1931 [id number]  Subjective: Patient was called to follow up on medication assistance with Pradaxa. HIPAA identifiers were obtained.  Patient is an 84 year old female with multiple medical conditions including but not limited to:  Hypertension, COPD, PVD, GERD, depression, hyperthyroidism, hyperlipidemia, osteoporosis,  Atrial fibrillation and flutter.  Patient shared Pradaxa was stopped because she was experiencing broken blood vessels in her legs and feet. She has been put back on Eliquis.   Objective:   Encounter Medications: Outpatient Encounter Medications as of 01/02/2018  Medication Sig  . acetaminophen (TYLENOL) 500 MG tablet Take 1,000 mg by mouth every 6 (six) hours as needed (pain).  Marland Kitchen apixaban (ELIQUIS) 5 MG TABS tablet Take 1 tablet (5 mg total) 2 (two) times daily by mouth.  . Blood Glucose Monitoring Suppl (ONETOUCH VERIO IQ SYSTEM) w/Device KIT Use to check sugar twice daily  . CALCIUM-MAG-VIT C-VIT D PO Take 1 tablet by mouth daily.  . carvedilol (COREG) 12.5 MG tablet Take 0.5 tablets (6.25 mg total) by mouth 2 (two) times daily with a meal.  . cholecalciferol (VITAMIN D) 1000 units tablet Take 1,000 Units by mouth daily after supper.  . diclofenac sodium (VOLTAREN) 1 % GEL Apply 1 application topically 2 (two) times daily as needed (knee pain).   . furosemide (LASIX) 40 MG tablet Take 2 tablets (80 mg total) by mouth daily.  Marland Kitchen gabapentin (NEURONTIN) 100 MG capsule Take 3 capsules (300 mg total) by mouth at bedtime.  Marland Kitchen glucose blood (ONETOUCH VERIO) test strip Use as instructed to check sugar 2 times daily  . insulin NPH Human (HUMULIN N,NOVOLIN N) 100 UNIT/ML injection Inject 0.45 mLs (45 Units total) into the skin daily before breakfast. And syringes 1/day  . Lancets (ONETOUCH ULTRASOFT) lancets Use to check blood sugar 2 times per day. Dx Code E11.9  . methimazole  (TAPAZOLE) 5 MG tablet Take 5 mg by mouth 2 (two) times a week. Monday and Friday  . Multiple Vitamin (MULTIVITAMIN WITH MINERALS) TABS tablet Take 1 tablet by mouth daily. Centrum Silver  . nitroGLYCERIN (NITROSTAT) 0.4 MG SL tablet Place 0.4 mg under the tongue every 5 (five) minutes as needed for chest pain. Reported on 01/28/2016  . potassium chloride SA (K-DUR,KLOR-CON) 20 MEQ tablet TAKE 1 TABLET DAILY EVERY  OTHER DAY ALTERNATING WITH  2 TABLETS DAILY EVERY OTHER DAY  . Turmeric 500 MG CAPS Take 500 mg by mouth daily after supper.   . umeclidinium bromide (INCRUSE ELLIPTA) 62.5 MCG/INH AEPB Inhale 1 puff into the lungs daily.  . [DISCONTINUED] clotrimazole (LOTRIMIN) 1 % cream Apply 1 application topically 2 (two) times daily. To inner thighs (Patient not taking: Reported on 01/02/2018)   No facility-administered encounter medications on file as of 01/02/2018.     Functional Status: In your present state of health, do you have any difficulty performing the following activities: 05/22/2017 02/07/2017  Hearing? N N  Vision? N N  Difficulty concentrating or making decisions? N N  Walking or climbing stairs? Y Y  Comment - orthopedic issues hip and knee  Dressing or bathing? N N  Doing errands, shopping? Y N  Preparing Food and eating ? - N  Using the Toilet? - N  In the past six months, have you accidently leaked urine? - Y  Comment - uses alsways pads. Unable to control urine.   Do you have problems with loss of bowel  control? - N  Managing your Medications? - N  Managing your Finances? - N  Housekeeping or managing your Housekeeping? - N  Some recent data might be hidden    Fall/Depression Screening: Fall Risk  12/12/2017 11/01/2017 06/06/2017  Falls in the past year? No No No  Number falls in past yr: - - -  Injury with Fall? - - -  Comment - - -  Risk for fall due to : - - History of fall(s)  Risk for fall due to: Comment - - -   PHQ 2/9 Scores 12/12/2017 11/01/2017 06/06/2017  02/07/2017 01/27/2017 08/22/2014 07/19/2013  PHQ - 2 Score 0 0 0 0 0 0 0      Assessment: Patient's medications were reviewed via telephone.     Drugs sorted by system:  Neurologic/Psychologic: Gabapentin  Cardiovascular: Eliquis Furosemide Carvedilol Nitroglycerin   Pulmonary/Allergy: Incruse  Endocrine: Novolin N Methimazole  Renal:  Topical: Diclofenac gel  Pain: Acetaminophen  Vitamins/Minerals: Calcium/Mag/Vit C/Vit D Cholecalciferol Multiple Vitamin Potassium Chloride Tumeric   Medication Assistance Findings: -Patient said she is no longer taking Pradaxa and has been restarted on Eliquis. -Patient reported Eliquis cost her $146 for a 30 day supply during the first week of January  -[facility] was called. Patient has a $95 deductible for tier 3-5 medication which was why her copay was $146. -Until she reaches the coverage gap, Eliquis should have a $45 copay for a 30 day supply or $125 for a 90-day supply from [facility] or $135 for a 90-day supply from a retail pharmacy.  Patient is over-income for the Extra Help Program   Patient also expressed concern paying for Incruse Ellipta.  Both Incruse Ellipta and Eliquis have a cash price of around $400 for a 30-day supply and will place the patient in the coverage gap pretty quickly.  Recommendation: If deemed therapeutically appropriate, Incruse could be switched to Spiriva which can be obtained at not cost to the patient via the [place] patient' assistance program.  If the patient's provider decides to change the patient to Spiriva, an application for the program can be completed.  Eliquis can also be obtained at no cost from the [employer] patient assistance program. However, the program, requires patients to spend at least 3% of their income on medication expenses.  (Patient has not spent enough to qualify yet but she will keep the EOBs) (EOB = estimation of benefits)  Novolin N Patient is  purchasing Relion Novolin N from [facility] and is paying cash and has not been keeping her receipts.  It would be prudent for a prescription for the Relion insulin to be sent to [facility] on the patient's behalf so a print-out of medications can be printed.  Patient needs all of her medication costs to be included for acceptance into patient assistance programs.    Plan: Send note to Dr. Halford Chessman requesting Incruse be switched to Spiriva  Send note to Dr. Loanne Drilling requesting a new prescription for Relion Novolin N be sent to [facility].  Follow up with the patient in 3-5 business days.   Elayne Guerin, PharmD, [place] Clinical Pharmacist [phone]

## 2018-01-03 ENCOUNTER — Telehealth: Payer: Self-pay | Admitting: Pulmonary Disease

## 2018-01-03 NOTE — Telephone Encounter (Signed)
Per [facility] pharmacist, pt can change to spiriva and get patient assistance.  Please let patient know this, and if she is agreeable, then change from incruse to spiriva respimat 2 puffs daily.

## 2018-01-03 NOTE — Telephone Encounter (Signed)
-----   Message from Elayne Guerin, [facility] sent at 01/02/2018 11:45 AM EST ----- Dr. Halford Chessman,  Please see the attached [facility] Pharmacist's note. Patient was referred for [place] due to issues with medication affordability.  Of importance:  Patient is concerned about her copay on Incruse (it is a tier 3 medication).   If deemed therapeutically appropriate, please consider using Spiriva in the place of Incruse as Spiriva is provided by [employer]'s patient assistance  program at no cost and they do not require an out-of-pocket sped.  Once the prescription is changed, we will send a partially  completed application for your signature. Please have your staff fax the form back to the [facility] office.  Blessings,  Elayne Guerin, PharmD, [place] Clinical Pharmacist [phone]

## 2018-01-04 NOTE — Telephone Encounter (Signed)
Spoke with patient Okay by VS to change from incruse inhaler to spriva resimet Patient okayed getting pt assistance form completed for the medication  Placed form in brown binder in front office by last name, patient will come in office on Monday 01-09-2018 to complete their patient portion of the form and hand back to myself or VS cubby  Will follow up next week on the patient assistance form

## 2018-01-06 ENCOUNTER — Ambulatory Visit: Payer: Self-pay | Admitting: Pharmacist

## 2018-01-06 ENCOUNTER — Other Ambulatory Visit: Payer: Self-pay | Admitting: Pharmacist

## 2018-01-06 NOTE — Telephone Encounter (Signed)
rec'd staff message from Denyse Amass and I am one of the [facility] Clinical Pharmacists.  She spoke with Ms. Derrington earlier this week and sent Dr. Halford Chessman a message about switching her to Spiriva. Katrina advised she sent patient application in the mail  Once completed by the patient, Katrina will fax paperwork to VS to complete our portion of application Once completed, it will need to be faxed to [facility] office at fax [phone]

## 2018-01-06 NOTE — Patient Outreach (Signed)
[facility]) Care Management  01/06/2018  [name] January 29, 1931 [id number]   Reviewed patient's chart. Dr. Juanetta Gosling office changed the patient to Spiriva per request and agreed to complete patient assistance forms. Patient was called. HIPAA identifiers were obtained.  Patient confirmed she spoke with Dr. Juanetta Gosling office about the change as well.    Plan: Route note to [employer], CPhT to send application for Spiriva to the patient and the provider's portion to Dr.Sood's office. [employer] can facilitate the patient assistance process on the Provider's behalf.  Follow up on the application in 41-93 days.  Elayne Guerin, PharmD, [place] Clinical Pharmacist [phone]

## 2018-01-16 ENCOUNTER — Ambulatory Visit: Payer: Self-pay | Admitting: Pharmacist

## 2018-01-16 ENCOUNTER — Other Ambulatory Visit: Payer: Self-pay | Admitting: Pharmacist

## 2018-01-16 NOTE — Patient Outreach (Signed)
[facility]) Care Management  01/16/2018  ELSE HABERMANN August 19, 1931 [id number]   Patient was called to follow up on patient assistance forms.  Unfortunately, she did not answer the phone. HIPAA compliant message was left on the patient's voicemail.   Plan: Call patient back in 3-5 business days.  Elayne Guerin, PharmD, [place] Clinical Pharmacist [phone]

## 2018-01-17 ENCOUNTER — Other Ambulatory Visit: Payer: Self-pay | Admitting: Pharmacist

## 2018-01-17 NOTE — Patient Outreach (Signed)
[name]) Care Management  [name]  [name] May 13, 1931 [id number]   Patient called back after receiving my message. HIPAA identifiers were obtained. Patient confirmed she received the application for Spiriva to be obtained from the [place] patient assistance program. Patient also confirmed that she completed the application and mailed the application back with the necessary financial documentation.  [facility] Certified Technician, Doreene Burke reported we have not received the application and the required documents from the patient yet.  As soon as the documents are received, we will fax the information to the provider for signature and then to the company for determination.  Plan:  Follow up within in 5-7 business days.  Elayne Guerin, PharmD, [place] Clinical Pharmacist [phone]

## 2018-01-17 NOTE — Telephone Encounter (Signed)
rec'd email back from [place] stating:     Good afternoon. The application was mailed to the patient and the patient confirmed that she received it, completed it and sent her financial information in the return envelope. We are waiting to get the packet mailed to the patient back. Once it is received we will fax the application to your office for signature.  Blessings,   Elayne Guerin, PharmD, [place] Clinical Pharmacist  [phone]

## 2018-01-17 NOTE — Telephone Encounter (Signed)
Sent a staff message to [place] today to provide update on pt's assistance forms for spiriva

## 2018-01-19 ENCOUNTER — Ambulatory Visit: Payer: Self-pay | Admitting: Pharmacist

## 2018-01-23 ENCOUNTER — Encounter: Payer: Medicare Other | Attending: Internal Medicine | Admitting: Dietician

## 2018-01-23 DIAGNOSIS — E1122 Type 2 diabetes mellitus with diabetic chronic kidney disease: Secondary | ICD-10-CM | POA: Diagnosis not present

## 2018-01-23 DIAGNOSIS — E119 Type 2 diabetes mellitus without complications: Secondary | ICD-10-CM | POA: Diagnosis not present

## 2018-01-23 DIAGNOSIS — E785 Hyperlipidemia, unspecified: Secondary | ICD-10-CM | POA: Diagnosis not present

## 2018-01-23 DIAGNOSIS — G4733 Obstructive sleep apnea (adult) (pediatric): Secondary | ICD-10-CM | POA: Diagnosis not present

## 2018-01-23 DIAGNOSIS — N183 Chronic kidney disease, stage 3 unspecified: Secondary | ICD-10-CM

## 2018-01-23 DIAGNOSIS — I129 Hypertensive chronic kidney disease with stage 1 through stage 4 chronic kidney disease, or unspecified chronic kidney disease: Secondary | ICD-10-CM | POA: Diagnosis not present

## 2018-01-23 DIAGNOSIS — J449 Chronic obstructive pulmonary disease, unspecified: Secondary | ICD-10-CM | POA: Insufficient documentation

## 2018-01-23 DIAGNOSIS — Z713 Dietary counseling and surveillance: Secondary | ICD-10-CM | POA: Diagnosis not present

## 2018-01-23 DIAGNOSIS — Z794 Long term (current) use of insulin: Secondary | ICD-10-CM

## 2018-01-23 NOTE — Patient Instructions (Signed)
Continue to stay active. Continue your mindful eating. Before eating the snack ask if you are hungry.  Watch portion size of nuts.

## 2018-01-23 NOTE — Progress Notes (Signed)
  Medical Nutrition Therapy:  Appt start time: 4944 end time:  1215.   Assessment:  Primary concerns today: Patient is here today alone for follow up.  She would like to lose weight and is frustrated because she has not.  Her weight today at home was 175 lbs.  She has been journaling her intake.  She makes healthy food choices and portion control.  She is as active as she can based on her limitations with walking.  She uses a cane and continues water yoga at the [facility].  She is very social and feels that some of these occasions cause problems with her food choices and goals.  Her fasting blood sugar has been 70-126. She only checks her blood sugar fasting. She is taking 45 units of NPH insulin each morning and does not reduce it for activity.  She has been having more blood sugars around 80-90.  Discussed that her blood sugars may be elevated post meal. History includes HTN, hyperlipidemia, COPD, OSA on C-pap and CKD stage 3.  Patient's son lives with her.  Patient does the shopping and cooking.  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    Preferred Learning Style:   No preference indicated   Learning Readiness:   Ready  Change in progress   DIETARY INTAKE:  Usual eating pattern includes 3 meals and 2 snacks per day.  24-hr recall:  B ( AM): smoothie (1/2 banana, berries, almond milk, oats) OR egg, toast, fruit, 1/2 strip bacon  Snk ( AM): coffee and saltines  L ( PM): 1/2 bagel, fruit, walnuts OR cottage cheese, fruit, tea OR chicken and kit kat Snk ( PM): nuts, fruit D ( PM): fried eggplant with tomatoes and cheese, salad, bread OR tuna souffle, poppyseed salad Snk ( PM): none Beverages: coffee, water, lemonade, water, unsweetened tea  Usual physical activity: water yoga, chair yoga, daily PT exercises  Estimated energy needs: 1200 calories 135 g carbohydrates 90 g protein 33 g fat  Progress Towards Goal(s):  In progress.   Nutritional Diagnosis:  NB-1.1 Food and  nutrition-related knowledge deficit As related to balance of carbohydrates, protein, and fat.  As evidenced by patient report and diet hx.    Intervention:  Nutrition education continued related to diabetes and healthy eating.  Discussed importance of adequate nutrition rather than restricting to lose weight.  Discussed nutrient density and benefits of more non starchy vegetables.  Continue to stay active. Continue your mindful eating. Before eating the snack ask if you are hungry.  Watch portion size of nuts.  Teaching Method Utilized:  Auditory  Handouts given during visit include:  1200 sample menus per patient report  Barriers to learning/adherence to lifestyle change: none  Demonstrated degree of understanding via:  Teach Back   Monitoring/Evaluation:  Dietary intake, exercise, label reading, and body weight in 2 week(s).

## 2018-01-26 MED ORDER — TIOTROPIUM BROMIDE MONOHYDRATE 2.5 MCG/ACT IN AERS: 2.0000 | INHALATION_SPRAY | RESPIRATORY_TRACT | 3 refills | Status: DC

## 2018-01-26 NOTE — Telephone Encounter (Signed)
Kelli do you still have these forms. We may have to fax this again.

## 2018-01-26 NOTE — Telephone Encounter (Signed)
rec'd fax from [employer] stating patient meets eligibility for patient assistance with [place] for the [employer]. They are in need of hardcopy for requested medication from our office fax to [phone] along with application copy.   Faxed this form this morning. Nothing further needed at this time.

## 2018-01-26 NOTE — Telephone Encounter (Signed)
[facility] requesting a hard copy of the patient assistance form for three months supply. Fax [phone]. ATTN Crystal Walker Cb is [phone]

## 2018-01-28 NOTE — Patient Instructions (Addendum)
Take the extra lasix as instructed by Dr Haroldine Laws.  Test(s) ordered today. Your results will be released to [place] (or called to you) after review, usually within 72hours after test completion. If any changes need to be made, you will be notified at that same time.   Medications reviewed and updated.  No changes recommended at this time.   Please followup in 6 months

## 2018-01-28 NOTE — Progress Notes (Signed)
Subjective:    Patient ID: [name], female    DOB: 09-Jun-1931, 84 y.o.   MRN: [id number]  HPI The patient is here for follow up.  Diabetes: She is following with Dr Loanne Drilling.  She is taking her medication daily as prescribed. She is compliant with a diabetic diet. She is exercising regularly. Her last a1c was 8.3. She checks her feet daily and denies foot lesions. She is up-to-date with an ophthalmology examination.   Hypertension: She is taking her medication daily. She is compliant with a low sodium diet.  She denies chest pain, palpitations and regular headaches. She is exercising regularly.      Hyperlipidemia: She is not on any medication.  She did not tolerate Lipitor. She is compliant with a low fat/cholesterol diet. She is exercising regularly.   Afib/flutter:  She is following with cardiology.  She is taking her Eliquis as prescribed.  She is also on carvedilol.  She denies chest pain, palpitations.  Chronic diastolic heart failure: She is taking her Lasix daily as prescribed.  She follows at the heart failure clinic.  Over the past month she has had an increase in her weight of about 5-6 lbs.  She notes increased SOB, wheeze and leg edema.  She feels much worse today compared to last week.  She is compliant with a low sodium diet.  She was instructed to take extra lasix if her weight went up, but she has not done that yet - she knew she was coming here.    COPD: She has mild COPD.  She is using her inhalers as prescribed.  She feels the inhaler does help.    Medications and allergies reviewed with patient and updated if appropriate.  Patient Active Problem List   Diagnosis Date Noted  . Arthritis of left sacroiliac joint ([place]) 05/04/2017  . Osteopenia 02/17/2017  . Piriformis syndrome of left side 02/16/2017  . Acquired leg length discrepancy 02/16/2017  . COPD GOLD I  01/27/2017  . Lumbar radiculopathy, acute 01/27/2017  . Rash 01/27/2017  . Granuloma annulare  07/27/2016  . Numbness of fingers 07/27/2016  . Primary osteoarthritis of right knee 06/27/2016  . Diabetes ([place]) 03/07/2016  . Acute renal failure superimposed on stage 3 chronic kidney disease ([place])   . Multifocal pneumonia 05/24/2015  . Sepsis ([place]) 05/24/2015  . OSA on CPAP 08/28/2014  . Chronic diastolic heart failure ([place]) 07/30/2014  . Nocturnal hypoxemia 07/09/2014  . SOB (shortness of breath) 05/15/2014  . Pulmonary HTN ([place]) 05/15/2014  . Crescendo angina ([place]) 05/02/2014  . Band keratopathy 01/16/2014  . 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 Epiphora due to insufficient drainage 06/13/2013  . Hyperthyroidism 11/04/2011  . Multinodular goiter 10/14/2011  . Atrophy, Fuchs' 09/29/2011  . ATRIAL FIBRILLATION 06/03/2010  . CHF 02/17/2010  . VITILIGO 11/07/2009  . SINOATRIAL NODE DYSFUNCTION 06/16/2009  . Iron deficiency anemia 01/02/2009  . ANEMIA, PERNICIOUS 11/20/2008  . Atrial flutter ([place]) 06/25/2008  . HYPERLIPIDEMIA 01/29/2008  . Coronary atherosclerosis 01/29/2008  . HTN (hypertension) 10/30/2007    Current Outpatient Medications on File Prior to Visit  Medication Sig Dispense Refill  . acetaminophen (TYLENOL) 500 MG tablet Take 1,000 mg by mouth every 6 (six) hours as needed (pain).    Marland Kitchen apixaban (ELIQUIS) 5 MG TABS tablet Take 1 tablet (5 mg total) 2 (two) times daily by mouth. 180 tablet 3  . Blood Glucose Monitoring Suppl (ONETOUCH VERIO IQ SYSTEM) w/Device KIT Use to check  sugar twice daily 1 kit 0  . CALCIUM-MAG-VIT C-VIT D PO Take 1 tablet by mouth daily.    . carvedilol (COREG) 12.5 MG tablet Take 0.5 tablets (6.25 mg total) by mouth 2 (two) times daily with a meal. 45 tablet 3  . cholecalciferol (VITAMIN D) 1000 units tablet Take 1,000 Units by mouth daily after supper.    . diclofenac sodium (VOLTAREN) 1 % GEL Apply 1 application topically 2 (two) times daily as needed (knee pain).     . furosemide (LASIX) 40 MG tablet Take 2 tablets (80 mg total)  by mouth daily. 180 tablet 3  . gabapentin (NEURONTIN) 100 MG capsule Take 3 capsules (300 mg total) by mouth at bedtime. 270 capsule 3  . glucose blood (ONETOUCH VERIO) test strip Use as instructed to check sugar 2 times daily 200 each 5  . insulin NPH Human (HUMULIN N,NOVOLIN N) 100 UNIT/ML injection Inject 0.45 mLs (45 Units total) into the skin daily before breakfast. And syringes 1/day 20 mL 11  . Lancets (ONETOUCH ULTRASOFT) lancets Use to check blood sugar 2 times per day. Dx Code E11.9 200 each 2  . methimazole (TAPAZOLE) 5 MG tablet Take 5 mg by mouth 2 (two) times a week. Monday and Friday    . Multiple Vitamin (MULTIVITAMIN WITH MINERALS) TABS tablet Take 1 tablet by mouth daily. Centrum Silver    . nitroGLYCERIN (NITROSTAT) 0.4 MG SL tablet Place 0.4 mg under the tongue every 5 (five) minutes as needed for chest pain. Reported on 01/28/2016    . potassium chloride SA (K-DUR,KLOR-CON) 20 MEQ tablet TAKE 1 TABLET DAILY EVERY  OTHER DAY ALTERNATING WITH  2 TABLETS DAILY EVERY OTHER DAY 135 tablet 3  . TURMERIC PO Take 1,000 mg by mouth. With tart cherry    . umeclidinium bromide (INCRUSE ELLIPTA) 62.5 MCG/INH AEPB Inhale 1 puff into the lungs daily. 1 each 5  . Tiotropium Bromide Monohydrate (SPIRIVA RESPIMAT) 2.5 MCG/ACT AERS Inhale 2 puffs into the lungs 1 day or 1 dose for 1 dose. 1 Inhaler 3   No current facility-administered medications on file prior to visit.     Past Medical History:  Diagnosis Date  . Anemia    iron defficiency  . Anginal pain ([place])   . Arthritis    "knees, feet, hands; joints" (05/27/2015)  . Asthma   . Atrial fibrillation or flutter    s/p RFCA 7/08;   s/p DCCV in past;   previously on amiodarone;  amio stopped due to lung toxicity  . CAD (coronary artery disease)    s/p NSTEMI tx with BMS to OM1 3/08;  cath 3/08: pOM 99% tx with PCI, pLAD 20%, ? mod stenosis at the AM  . CAP (community acquired pneumonia) 05/24/2015  . Chronic diastolic heart failure  (H[facility])    echo 11/11:  EF 55-60%, severe LVH, mod LAE, mild MR, mildly increased PASP  . COPD (chronic obstructive pulmonary disease) ([place])   . Degenerative joint disease   . Dystrophy, corneal stromal   . Heart murmur   . HLD (hyperlipidemia)   . HTN (hypertension)    essential nos  . Hypopotassemia    PMH of  . Muscle pain   . Myocardial infarction ([place]) 2008  . OSA on CPAP   . Osteoporosis   . Pneumonia 05/27/2015  . Protein calorie malnutrition ([place])   . Rash and nonspecific skin eruption    both arms,awaiting bio   dr Delman Cheadle  . Seasonal allergies   .  Shortness of breath dyspnea   . Type II diabetes mellitus ([place])    Dr Loanne Drilling    Past Surgical History:  Procedure Laterality Date  . A FLUTTER ABLATION     Dr Lovena Le  . CATARACT EXTRACTION W/ INTRAOCULAR LENS  IMPLANT, BILATERAL Bilateral   . CHOLECYSTECTOMY    . COLONOSCOPY  6/07   2 polyps Dr. Kinnie Feil in [place]  . colonoscopy with polypectomy      X 2; [facility]  . CORNEAL TRANSPLANT Bilateral   . CORONARY ANGIOPLASTY WITH STENT PLACEMENT  02/2007   [facility] w/Dr Lovena Le  . EYE SURGERY    . gravid 2 para 2    . KNEE CARTILAGE SURGERY Right   . LEFT AND RIGHT HEART CATHETERIZATION WITH CORONARY ANGIOGRAM N/A 05/07/2014   Procedure: LEFT AND RIGHT HEART CATHETERIZATION WITH CORONARY ANGIOGRAM;  Surgeon: Peter M Martinique, MD;  Location[facility] C[facility];  Service: Cardiovascular;  Laterality: N/A;  . TOTAL KNEE ARTHROPLASTY Right 06/28/2016   Procedure: TOTAL KNEE ARTHROPLASTY;  Surgeon: Frederik Pear, MD;  Location: [place];  Service: Orthopedics;  Laterality: Right;    Social History   Socioeconomic History  . Marital status: Widowed    Spouse name: None  . Number of children: None  . Years of education: None  . Highest education level: None  Social Needs  . Financial resource strain: None  . Food insecurity - worry: None  . Food insecurity - inability: None  . Transportation needs - medical: None  . Transportation needs -  non-medical: None  Occupational History  . Occupation: Pharmacist, hospital  Tobacco Use  . Smoking status: Former Smoker    Packs/day: 0.25    Years: 18.00    Pack years: 4.50    Types: Cigarettes    Last attempt to quit: 12/27/1968    Years since quitting: 49.1  . Smokeless tobacco: Never Used  . Tobacco comment: smoked [place], up to 1 pp week  Substance and Sexual Activity  . Alcohol use: Yes    Comment: occ  . Drug use: No  . Sexual activity: No  Other Topics Concern  . None  Social History Sports administrator. Widowed. Rarely drinks cafeine.     Family History  Problem Relation Age of Onset  . Diabetes Mother   . Hypertension Mother   . Transient ischemic attack Mother   . Arthritis Mother   . Heart attack Father 35  . Arthritis Father   . Hypertension Father   . Breast cancer Maternal Aunt   . Arthritis Maternal Aunt     Review of Systems  Constitutional: Negative for chills and fever.  Respiratory: Positive for shortness of breath (increased x couple weeks) and wheezing (past few days). Negative for cough.   Cardiovascular: Positive for leg swelling (x 2-3 days). Negative for chest pain and palpitations.  Neurological: Negative for light-headedness and headaches.       Objective:   Vitals:   01/30/18 1133  BP: (!) 152/60  Pulse: 86  Resp: 18  Temp: 97.9 F (36.6 C)  SpO2: 93%   Wt Readings from Last 3 Encounters:  01/30/18 182 lb (82.6 kg)  12/12/17 174 lb (78.9 kg)  11/23/17 178 lb (80.7 kg)   Body mass index is 33.29 kg/m.   Physical Exam    Constitutional: Appears well-developed and well-nourished. No distress.  HENT:  Head: Normocephalic and atraumatic.  Neck: Neck supple. No tracheal deviation present. No thyromegaly present.  No cervical  lymphadenopathy Cardiovascular: Normal rate, irregular rhythm and normal heart sounds.   No murmur heard. No carotid bruit .  1+ non pitting b/l LE edema Pulmonary/Chest: Effort normal and breath sounds  normal. No respiratory distress.  One expiratory wheeze heard. No rales.  Skin: Skin is warm and dry. Not diaphoretic.  Psychiatric: Normal mood and affect. Behavior is normal.      Assessment & Plan:    See Problem List for Assessment and Plan of chronic medical problems.

## 2018-01-30 ENCOUNTER — Ambulatory Visit (INDEPENDENT_AMBULATORY_CARE_PROVIDER_SITE_OTHER): Payer: Medicare Other | Admitting: Internal Medicine

## 2018-01-30 ENCOUNTER — Other Ambulatory Visit (INDEPENDENT_AMBULATORY_CARE_PROVIDER_SITE_OTHER): Payer: Medicare Other

## 2018-01-30 ENCOUNTER — Encounter: Payer: Self-pay | Admitting: Internal Medicine

## 2018-01-30 VITALS — BP 152/60 | HR 86 | Temp 97.9°F | Resp 18 | Wt 182.0 lb

## 2018-01-30 DIAGNOSIS — N183 Chronic kidney disease, stage 3 (moderate): Secondary | ICD-10-CM | POA: Diagnosis not present

## 2018-01-30 DIAGNOSIS — E1122 Type 2 diabetes mellitus with diabetic chronic kidney disease: Secondary | ICD-10-CM

## 2018-01-30 DIAGNOSIS — I482 Chronic atrial fibrillation, unspecified: Secondary | ICD-10-CM

## 2018-01-30 DIAGNOSIS — Z794 Long term (current) use of insulin: Secondary | ICD-10-CM

## 2018-01-30 DIAGNOSIS — I5032 Chronic diastolic (congestive) heart failure: Secondary | ICD-10-CM | POA: Diagnosis not present

## 2018-01-30 DIAGNOSIS — E782 Mixed hyperlipidemia: Secondary | ICD-10-CM

## 2018-01-30 DIAGNOSIS — I10 Essential (primary) hypertension: Secondary | ICD-10-CM | POA: Diagnosis not present

## 2018-01-30 LAB — COMPREHENSIVE METABOLIC PANEL
ALBUMIN: 4.1 g/dL (ref 3.5–5.2)
ALT: 21 U/L (ref 0–35)
AST: 34 U/L (ref 0–37)
Alkaline Phosphatase: 85 U/L (ref 39–117)
BUN: 29 mg/dL — ABNORMAL HIGH (ref 6–23)
CALCIUM: 10 mg/dL (ref 8.4–10.5)
CHLORIDE: 100 meq/L (ref 96–112)
CO2: 32 meq/L (ref 19–32)
CREATININE: 1.07 mg/dL (ref 0.40–1.20)
GFR: 62.8 mL/min (ref >60.00)
Glucose, Bld: 180 mg/dL — ABNORMAL HIGH (ref 70–99)
POTASSIUM: 4.4 meq/L (ref 3.5–5.1)
Sodium: 141 mEq/L (ref 135–145)
Total Bilirubin: 0.7 mg/dL (ref 0.2–1.2)
Total Protein: 7.7 g/dL (ref 6.0–8.3)

## 2018-01-30 LAB — BRAIN NATRIURETIC PEPTIDE: Pro B Natriuretic peptide (BNP): 453 pg/mL — ABNORMAL HIGH (ref 0.0–100.0)

## 2018-01-30 LAB — HEMOGLOBIN A1C: Hgb A1c MFr Bld: 8 % — ABNORMAL HIGH (ref 4.6–6.5)

## 2018-01-30 NOTE — Assessment & Plan Note (Signed)
She is not currently on any medication-she was intolerant to Lipitor She is exercising regularly and trying to prove her diet She is not fasting today so we will hold off on checking lipid panel

## 2018-01-30 NOTE — Assessment & Plan Note (Signed)
Blood pressure elevated here today, which may be partially related to fluid overload and the effort that it took her to walk back to the exam room We will be increasing her Lasix over the next couple days and has a follow-up visit in the heart failure clinic in 2 days BP can be rechecked at that time CMP today

## 2018-01-30 NOTE — Assessment & Plan Note (Signed)
Fluid overloaded here today with 5-6 pound weight gain over the past month, increased shortness of breath and leg edema She has not yet taken her extra Lasix, but knows that she needs to do that and will do so today and tomorrow CMP, BNP today Has follow-up with the heart failure clinic in 2 days

## 2018-01-30 NOTE — Assessment & Plan Note (Signed)
She is following with Dr. Loanne Drilling She is seeing a nutritionist and working on improving her diet She is exercising and trying to lose weight Since she is getting blood work I will check her A1c-she does have a follow-up appointment with endocrine

## 2018-01-30 NOTE — Telephone Encounter (Signed)
The forms are in the scan folder, ready for scan. Not sent as of today

## 2018-01-30 NOTE — Assessment & Plan Note (Signed)
Irregular rhythm on exam Taking Eliquis Rate controlled

## 2018-01-31 MED ORDER — TIOTROPIUM BROMIDE MONOHYDRATE 2.5 MCG/ACT IN AERS: 2.0000 | INHALATION_SPRAY | RESPIRATORY_TRACT | 3 refills | Status: DC

## 2018-01-31 NOTE — Telephone Encounter (Signed)
Called and spoke with [name] at [phone] Crystal stated that the hard copy of script needs to be for 8mo refill not one Reprinted the script to reflect 51mo refill supply Refax the info to Crystal's attn today at fax [phone] Nothing further needed

## 2018-02-01 ENCOUNTER — Ambulatory Visit (HOSPITAL_COMMUNITY)
Admission: RE | Admit: 2018-02-01 | Discharge: 2018-02-01 | Disposition: A | Discharge status: Home / Self Care | Payer: Medicare Other | Source: Ambulatory Visit | Attending: Internal Medicine | Admitting: Internal Medicine

## 2018-02-01 VITALS — BP 144/68 | HR 78 | Wt 180.8 lb

## 2018-02-01 DIAGNOSIS — I272 Pulmonary hypertension, unspecified: Secondary | ICD-10-CM | POA: Diagnosis not present

## 2018-02-01 DIAGNOSIS — Z79899 Other long term (current) drug therapy: Secondary | ICD-10-CM | POA: Diagnosis not present

## 2018-02-01 DIAGNOSIS — I2510 Atherosclerotic heart disease of native coronary artery without angina pectoris: Secondary | ICD-10-CM | POA: Diagnosis not present

## 2018-02-01 DIAGNOSIS — I5032 Chronic diastolic (congestive) heart failure: Secondary | ICD-10-CM

## 2018-02-01 DIAGNOSIS — G4733 Obstructive sleep apnea (adult) (pediatric): Secondary | ICD-10-CM | POA: Diagnosis not present

## 2018-02-01 DIAGNOSIS — I4892 Unspecified atrial flutter: Secondary | ICD-10-CM | POA: Diagnosis not present

## 2018-02-01 DIAGNOSIS — Z880 Allergy status to penicillin: Secondary | ICD-10-CM | POA: Insufficient documentation

## 2018-02-01 DIAGNOSIS — I482 Chronic atrial fibrillation, unspecified: Secondary | ICD-10-CM

## 2018-02-01 DIAGNOSIS — I481 Persistent atrial fibrillation: Secondary | ICD-10-CM | POA: Diagnosis not present

## 2018-02-01 DIAGNOSIS — R0602 Shortness of breath: Secondary | ICD-10-CM

## 2018-02-01 DIAGNOSIS — Z885 Allergy status to narcotic agent status: Secondary | ICD-10-CM | POA: Diagnosis not present

## 2018-02-01 DIAGNOSIS — R233 Spontaneous ecchymoses: Secondary | ICD-10-CM | POA: Diagnosis not present

## 2018-02-01 DIAGNOSIS — J449 Chronic obstructive pulmonary disease, unspecified: Secondary | ICD-10-CM | POA: Diagnosis not present

## 2018-02-01 DIAGNOSIS — Z7901 Long term (current) use of anticoagulants: Secondary | ICD-10-CM | POA: Diagnosis not present

## 2018-02-01 DIAGNOSIS — I110 Hypertensive heart disease with heart failure: Secondary | ICD-10-CM | POA: Diagnosis not present

## 2018-02-01 DIAGNOSIS — M1990 Unspecified osteoarthritis, unspecified site: Secondary | ICD-10-CM | POA: Diagnosis not present

## 2018-02-01 DIAGNOSIS — Z881 Allergy status to other antibiotic agents status: Secondary | ICD-10-CM | POA: Insufficient documentation

## 2018-02-01 DIAGNOSIS — I5042 Chronic combined systolic (congestive) and diastolic (congestive) heart failure: Secondary | ICD-10-CM | POA: Insufficient documentation

## 2018-02-01 LAB — BASIC METABOLIC PANEL
Anion gap: 12 (ref 5–15)
BUN: 32 mg/dL — AB (ref 6–20)
CALCIUM: 9.8 mg/dL (ref 8.9–10.3)
CHLORIDE: 100 mmol/L — AB (ref 101–111)
CO2: 26 mmol/L (ref 22–32)
CREATININE: 1.18 mg/dL — AB (ref 0.44–1.00)
GFR, EST AFRICAN AMERICAN: 48 mL/min — AB (ref >60)
GFR, EST NON AFRICAN AMERICAN: 41 mL/min — AB (ref >60)
Glucose, Bld: 127 mg/dL — ABNORMAL HIGH (ref 65–99)
Potassium: 4.4 mmol/L (ref 3.5–5.1)
SODIUM: 138 mmol/L (ref 135–145)

## 2018-02-01 NOTE — Patient Instructions (Signed)
Increase Furosemide (Lasix) to 80 mg (2 tabs) Twice daily FOR 2-3 DAYS UNTIL WEIGHT IS 173-174 LBS.  Lab today  Your physician recommends that you schedule a follow-up appointment in: 2-3 weeks

## 2018-02-01 NOTE — Addendum Note (Signed)
Encounter addended by: Scarlette Calico, RN on: 02/01/2018 12:54 PM  Actions taken: Visit diagnoses modified, Order list changed, Diagnosis association updated, Sign clinical note

## 2018-02-01 NOTE — Progress Notes (Signed)
Patient ID: [name], female   DOB: December 27, 1931, 84 y.o.   MRN: [id number]    Advanced Heart Failure Clinic Note   Patient ID: [name], female   DOB: 11-Apr-1931, 84 y.o.   MRN: [id number]  Referring Physician: Lovena Le Primary Care: [name] Primary Cardiologist:[place]  HPI: [name] is a pleasant 84 yo woman with CAD, HTN, atrial fibrillation/A flutter and chronic prior systolic heart failure, (which was likely rate related) and diastolic heart failure. She was referred by Dr. Lovena Le for further evaluation of Pulmonary HTN.   Underwent 2D echo in 3/15 which showed EF 60-65% mild LVH. RV was normal. No significant valvular abnormalities.   Underwent R/L cath 05/07/14 Which showed stable CAD and significant PH  RA 6 RV 71/7 6 PA 63/17 (33)  PCWP 19 mm Hg  LV 174/16 mm Hg  AO 171/62 mean 103 mm Hg  PVR = 2.86 Oxygen saturations:  PA 69%  AO 99%  Cardiac Output/Index (Fick) 4.88/2.7    Left mainstem: Normal.  Left anterior descending (LAD): Moderate calcification proximally. Mild irregularities less than 10%. The first diagonal has 40-50% ostial disease.  Left circumflex (LCx): The LCx gives rise to a large OM1 then terminates in the AV groove. There is 20% disease in the proximal LCx. The first OM stent is patent with diffuse 20% disease.  Right coronary artery (RCA): The RCA arises anteriorly. There is an eccentric slit like stenosis in the proximal vessel to 60-70%. The mid vessel has segmental 70-80% disease.  Left ventriculography: Left ventricular systolic function is normal, LVEF is estimated at 55-65%, there is no significant mitral regurgitation   We saw her for an initial visit in May 2105 for pHTN and DOE. At that time she was quite dyspneic with activity. HR was in 40-50 range on carvedilol 50 bid and digoxin. We stopped her digoxin and cut carvedilol back to 25 bid. PFTs, VQ and CXR were ordered. I also asked her to decrease her H2O intake. We saw her  back last month and she was feeling much better. Given results of PFTs overnight oximeter was placed and referred to Dr. Gwenette Greet. She has had overnight oximetry which showed oxygen desaturation as low as 83%, and spent 97 minutes less than 88% during the night.   Admitted 05/21/17-05/27/17 with COPD exacerbation, acute on chronic diastolic CHF. Diuresed with 5L with IV Lasix. Discharge weight 173 pounds.  Returns today for routine f/u with her daughter. Says she has been really struggling with DOE particularly over the last 2-3 weeks. Says she has taken extra lasix on Monday and Tuesday. Says it did improve edema and DOE. But doesn't feel likeshe is back to baseline. Weight at home typically 173 but now 178. Had peaked at 183. + edema. + orthopnea and PND. Wearing. Having occasional indigestion at night. Says she has taken some NTG for it. No exertional CP. Saw PCP a few days ago and BNP up from 200 to 453. Drinking lots of water  Results:  05/21/14: VQ normal 5/15: CXR: 3/16: Echo 3/16 EF 60-65% RV normal. Mild TR No RVSP measured  5/15: PFTs FEV1  1.4 (95%) FVC    1.89 (110%) FEV/FVC 88% FEF 25-75%  66% DLCO 39%  BP rest: 138/58 BP peak: 182/66 Peak VO2: 8.9 (69.1% predicted peak VO2) VE/VCO2 slope: 34.6 OUES: Peak RER: 1.24 Ventilatory Threshold: 5.8 (50% predicted and 65% measured peak VO2) Peak RR 38 Peak Ventilation: 32.7 VE/MVV: 71%  PETCO2 at peak: 36  O2pulse: 8 (100% predicted O2pulse)   Current Outpatient Medications  Medication Sig Dispense Refill  . acetaminophen (TYLENOL) 500 MG tablet Take 1,000 mg by mouth every 6 (six) hours as needed (pain).    Marland Kitchen apixaban (ELIQUIS) 5 MG TABS tablet Take 1 tablet (5 mg total) 2 (two) times daily by mouth. 180 tablet 3  . Blood Glucose Monitoring Suppl (ONETOUCH VERIO IQ SYSTEM) w/Device KIT Use to check sugar twice daily 1 kit 0  . CALCIUM-MAG-VIT C-VIT D PO Take 1 tablet by mouth daily.    . carvedilol (COREG) 12.5 MG tablet Take  0.5 tablets (6.25 mg total) by mouth 2 (two) times daily with a meal. 45 tablet 3  . cholecalciferol (VITAMIN D) 1000 units tablet Take 1,000 Units by mouth daily after supper.    . diclofenac sodium (VOLTAREN) 1 % GEL Apply 1 application topically 2 (two) times daily as needed (knee pain).     . furosemide (LASIX) 40 MG tablet Take 2 tablets (80 mg total) by mouth daily. 180 tablet 3  . gabapentin (NEURONTIN) 100 MG capsule Take 3 capsules (300 mg total) by mouth at bedtime. 270 capsule 3  . glucose blood (ONETOUCH VERIO) test strip Use as instructed to check sugar 2 times daily 200 each 5  . insulin NPH Human (HUMULIN N,NOVOLIN N) 100 UNIT/ML injection Inject 0.45 mLs (45 Units total) into the skin daily before breakfast. And syringes 1/day 20 mL 11  . Lancets (ONETOUCH ULTRASOFT) lancets Use to check blood sugar 2 times per day. Dx Code E11.9 200 each 2  . methimazole (TAPAZOLE) 5 MG tablet Take 5 mg by mouth 2 (two) times a week. Monday and Friday    . Multiple Vitamin (MULTIVITAMIN WITH MINERALS) TABS tablet Take 1 tablet by mouth daily. Centrum Silver    . nitroGLYCERIN (NITROSTAT) 0.4 MG SL tablet Place 0.4 mg under the tongue every 5 (five) minutes as needed for chest pain. Reported on 01/28/2016    . potassium chloride SA (K-DUR,KLOR-CON) 20 MEQ tablet TAKE 1 TABLET DAILY EVERY  OTHER DAY ALTERNATING WITH  2 TABLETS DAILY EVERY OTHER DAY 135 tablet 3  . Tiotropium Bromide Monohydrate (SPIRIVA RESPIMAT) 2.5 MCG/ACT AERS Inhale 2 puffs into the lungs 1 day or 1 dose for 1 dose. 3 Inhaler 3  . TURMERIC PO Take 1,000 mg by mouth. With tart cherry    . umeclidinium bromide (INCRUSE ELLIPTA) 62.5 MCG/INH AEPB Inhale 1 puff into the lungs daily. 1 each 5   No current facility-administered medications for this encounter.     Allergies  Allergen Reactions  . Amlodipine Besylate Other (See Comments)    REACTION: tingling in lips & gum edema  . Levaquin [Levofloxacin] Shortness Of Breath and  Swelling    angioedema  . Lobster [Shellfish Allergy] Other (See Comments)    angioedema  . Penicillins Rash    Has patient had a PCN reaction causing immediate rash, facial/tongue/throat swelling, SOB or lightheadedness with hypotension: Yes Has patient had a PCN reaction causing severe rash involving mucus membranes or skin necrosis: No Has patient had a PCN reaction that required hospitalization: No Has patient had a PCN reaction occurring within the last 10 years: No If all of the above answers are "NO", then may proceed with Cephalosporin use.  . Valsartan Other (See Comments)    REACTION: angioedema  . Codeine Other (See Comments)    Mental status changes  . Lipitor [Atorvastatin] Other (See Comments)    weakness  .  Oxycodone Other (See Comments)    hallucinations  . Statins     Made her too weak  . Tramadol Hcl Nausea And Vomiting   PHYSICAL EXAM: Vitals:   02/01/18 1216  BP: (!) 144/68  Pulse: 78  SpO2: 91%  Weight: 180 lb 12 oz (82 kg)   Wt Readings from Last 3 Encounters:  02/01/18 180 lb 12 oz (82 kg)  01/30/18 182 lb (82.6 kg)  12/12/17 174 lb (78.9 kg)   General:  Appears younger than stated age. No resp difficulty HEENT: normal Neck: supple.JVP 9-10 Carotids 2+ bilat; no bruits. No lymphadenopathy or thryomegaly appreciated. Cor: PMI nondisplaced. Iregular rate & rhythm. No rubs, gallops or murmurs. Lungs: decreased throughout worse at right base  Abdomen: soft, nontender, nondistended. No hepatosplenomegaly. No bruits or masses. Good bowel sounds. Extremities: no cyanosis, clubbing, rash, mild ankle edema Neuro: alert & orientedx3, cranial nerves grossly intact. moves all 4 extremities w/o difficulty. Affect pleasant   ASSESSMENT & PLAN: 1) Chronic diastolic CHF: Echo 08/8118 EF 60-65%, LA severely dilated.  - Remains NYHA III-IIIb. Worse today in setting of volume overload. Still 5 pounds up from baseline.  - Previous ReDS vest 29%. But now BNP rising -  Continue Lasix 80 mg daily. Will have her double for 2-3 more days until weight back down to baseline 173-174.  - BMET today.   - Continue KCl supplementation. - Repeat Echo. Can consider RHC if EF low or symptoms do not improve.  - Recommend compression hose.  - Reinforced fluid restriction to < 2 L daily, sodium restriction to less than 2000 mg daily, and the importance of daily weights.   - Suspect dyspnea is multifactorial due to COPD and HF. But worse today in setting of volume overload. If symptoms do not improve with diuresis consider repeat R/L cath  2) Persistent atrial fib/flutter    This patients CHA2DS2-VASc Score and unadjusted Ischemic Stroke Rate (% per year) is equal to 7.2 % stroke rate/year from a score of 5 Above score calculated as 2 points each if present [Age > 75, or Stroke/TIA/TE] - Remains on Eliquis - Denies bleeding.   3) OSA/COPD - Follows with Dr. Halford Chessman.  - Continue nightly CPAP.   4) CAD - No frank angina but is taking NTG on occasion at night.  - Last cath 2015 with moderate CAD. If episodes become more frequent will need rpeat cath  5) HTN - Mildly elevated in clinic Will assess response to lasix.   6) Petechial rash - Has seen Dermatology and told they were broken blood vessels  7). Arthritis - Limits her activity  - Large contributor to her deconditioning. Encouraged increased activity as tolerated.  Glori Bickers, MD  12:24 PM

## 2018-02-02 ENCOUNTER — Ambulatory Visit: Payer: Self-pay | Admitting: Pharmacist

## 2018-02-03 ENCOUNTER — Other Ambulatory Visit: Payer: Self-pay | Admitting: Pharmacy Technician

## 2018-02-03 DIAGNOSIS — G4733 Obstructive sleep apnea (adult) (pediatric): Secondary | ICD-10-CM | POA: Diagnosis not present

## 2018-02-03 NOTE — Patient Outreach (Signed)
[facility]) Care Management  02/03/2018  [name] 09-01-1931 [id number]   Provider portion of [employer] patient assistance application for Spiriva Respimat was received by fax. Now that the application is complete I have faxed the completed application and documentation to the company for evaluation.  Doreene Burke, [place] [phone]

## 2018-02-08 ENCOUNTER — Ambulatory Visit: Payer: Self-pay | Admitting: Pharmacist

## 2018-02-09 ENCOUNTER — Other Ambulatory Visit: Payer: Self-pay | Admitting: Pharmacist

## 2018-02-09 NOTE — Patient Outreach (Signed)
[facility] Care Management  Late Entry for 02/08/18  [name] [birth year] [id number]   Patient called me to investigate the status of her Spiriva application. HIPAA identifiers were obtained. Patient was updated on the following:  Doreene Burke documented receiving the provider's portion of the application on 08/30/23 and faxing the completed application on the patient's behalf to [employer].  Crystal's note stated she would follow up on 02/10/18.  Patient was also reminded that the patient assistance process could take anywhere from 4- 8 weeks.  Plan: Doreene Burke will follow up with the patient.  Elayne Guerin, PharmD, [place] Clinical Pharmacist [phone]

## 2018-02-10 ENCOUNTER — Ambulatory Visit: Payer: Self-pay | Admitting: Pharmacy Technician

## 2018-02-13 ENCOUNTER — Ambulatory Visit: Payer: Self-pay | Admitting: Pharmacy Technician

## 2018-02-15 ENCOUNTER — Ambulatory Visit (HOSPITAL_COMMUNITY)
Admission: RE | Admit: 2018-02-15 | Discharge: 2018-02-15 | Disposition: A | Discharge status: Home / Self Care | Payer: Medicare Other | Source: Ambulatory Visit | Attending: Internal Medicine | Admitting: Internal Medicine

## 2018-02-15 VITALS — BP 162/80 | HR 84 | Wt 185.2 lb

## 2018-02-15 DIAGNOSIS — I10 Essential (primary) hypertension: Secondary | ICD-10-CM

## 2018-02-15 DIAGNOSIS — Z885 Allergy status to narcotic agent status: Secondary | ICD-10-CM | POA: Insufficient documentation

## 2018-02-15 DIAGNOSIS — I2510 Atherosclerotic heart disease of native coronary artery without angina pectoris: Secondary | ICD-10-CM | POA: Diagnosis not present

## 2018-02-15 DIAGNOSIS — Z79899 Other long term (current) drug therapy: Secondary | ICD-10-CM | POA: Diagnosis not present

## 2018-02-15 DIAGNOSIS — M1990 Unspecified osteoarthritis, unspecified site: Secondary | ICD-10-CM | POA: Insufficient documentation

## 2018-02-15 DIAGNOSIS — I482 Chronic atrial fibrillation, unspecified: Secondary | ICD-10-CM

## 2018-02-15 DIAGNOSIS — G4733 Obstructive sleep apnea (adult) (pediatric): Secondary | ICD-10-CM | POA: Diagnosis not present

## 2018-02-15 DIAGNOSIS — I5032 Chronic diastolic (congestive) heart failure: Secondary | ICD-10-CM

## 2018-02-15 DIAGNOSIS — Z881 Allergy status to other antibiotic agents status: Secondary | ICD-10-CM | POA: Diagnosis not present

## 2018-02-15 DIAGNOSIS — I481 Persistent atrial fibrillation: Secondary | ICD-10-CM | POA: Diagnosis not present

## 2018-02-15 DIAGNOSIS — Z880 Allergy status to penicillin: Secondary | ICD-10-CM | POA: Insufficient documentation

## 2018-02-15 DIAGNOSIS — Z7901 Long term (current) use of anticoagulants: Secondary | ICD-10-CM | POA: Diagnosis not present

## 2018-02-15 DIAGNOSIS — J449 Chronic obstructive pulmonary disease, unspecified: Secondary | ICD-10-CM | POA: Insufficient documentation

## 2018-02-15 DIAGNOSIS — I110 Hypertensive heart disease with heart failure: Secondary | ICD-10-CM | POA: Insufficient documentation

## 2018-02-15 DIAGNOSIS — I5042 Chronic combined systolic (congestive) and diastolic (congestive) heart failure: Secondary | ICD-10-CM | POA: Diagnosis not present

## 2018-02-15 DIAGNOSIS — R233 Spontaneous ecchymoses: Secondary | ICD-10-CM | POA: Insufficient documentation

## 2018-02-15 DIAGNOSIS — I272 Pulmonary hypertension, unspecified: Secondary | ICD-10-CM | POA: Insufficient documentation

## 2018-02-15 LAB — BASIC METABOLIC PANEL
Anion gap: 10 (ref 5–15)
BUN: 24 mg/dL — ABNORMAL HIGH (ref 6–20)
CALCIUM: 9.4 mg/dL (ref 8.9–10.3)
CO2: 25 mmol/L (ref 22–32)
CREATININE: 1.06 mg/dL — AB (ref 0.44–1.00)
Chloride: 105 mmol/L (ref 101–111)
GFR calc Af Amer: 54 mL/min — ABNORMAL LOW (ref >60)
GFR, EST NON AFRICAN AMERICAN: 47 mL/min — AB (ref >60)
Glucose, Bld: 135 mg/dL — ABNORMAL HIGH (ref 65–99)
Potassium: 4.2 mmol/L (ref 3.5–5.1)
SODIUM: 140 mmol/L (ref 135–145)

## 2018-02-15 LAB — BRAIN NATRIURETIC PEPTIDE: B NATRIURETIC PEPTIDE 5: 529.1 pg/mL — AB (ref 0.0–100.0)

## 2018-02-15 MED ORDER — POTASSIUM CHLORIDE CRYS ER 20 MEQ PO TBCR: 40.0000 meq | EXTENDED_RELEASE_TABLET | Freq: Every day | ORAL | 6 refills | Status: DC

## 2018-02-15 MED ORDER — FUROSEMIDE 80 MG PO TABS: 80.0000 mg | ORAL_TABLET | Freq: Two times a day (BID) | ORAL | 6 refills | Status: DC

## 2018-02-15 MED ORDER — METOLAZONE 2.5 MG PO TABS: 2.5000 mg | ORAL_TABLET | ORAL | 3 refills | Status: DC

## 2018-02-15 NOTE — Progress Notes (Signed)
Patient ID: [name], female   DOB: [birth year], 84 y.o.   MRN: [id number]    Advanced Heart Failure Clinic Note   Patient ID: [name], female   DOB: 06/09/1931, 84 y.o.   MRN: [id number]  Referring Physician: Lovena Le Primary Care: Billey Gosling Primary Cardiologist:Taylor  HPI: [name] is a pleasant 84 yo woman with CAD, HTN, atrial fibrillation/A flutter and chronic prior systolic heart failure, (which was likely rate related) and diastolic heart failure. She was referred by Dr. Lovena Le for further evaluation of Pulmonary HTN.   Underwent 2D echo in 3/15 which showed EF 60-65% mild LVH. RV was normal. No significant valvular abnormalities.   Underwent R/L cath 05/07/14 Which showed stable CAD and significant PH  RA 6 RV 71/7 6 PA 63/17 (33)  PCWP 19 mm Hg  LV 174/16 mm Hg  AO 171/62 mean 103 mm Hg  PVR = 2.86 Oxygen saturations:  PA 69%  AO 99%  Cardiac Output/Index (Fick) 4.88/2.7    Left mainstem: Normal.  Left anterior descending (LAD): Moderate calcification proximally. Mild irregularities less than 10%. The first diagonal has 40-50% ostial disease.  Left circumflex (LCx): The LCx gives rise to a large OM1 then terminates in the AV groove. There is 20% disease in the proximal LCx. The first OM stent is patent with diffuse 20% disease.  Right coronary artery (RCA): The RCA arises anteriorly. There is an eccentric slit like stenosis in the proximal vessel to 60-70%. The mid vessel has segmental 70-80% disease.  Left ventriculography: Left ventricular systolic function is normal, LVEF is estimated at 55-65%, there is no significant mitral regurgitation   We saw her for an initial visit in May 2105 for pHTN and DOE. At that time she was quite dyspneic with activity. HR was in 40-50 range on carvedilol 50 bid and digoxin. We stopped her digoxin and cut carvedilol back to 25 bid. PFTs, VQ and CXR were ordered. I also asked her to decrease her H2O intake. Given  results of PFTs overnight oximeter was placed and referred to Dr. Gwenette Greet. She has had overnight oximetry which showed oxygen desaturation as low as 83%, and spent 97 minutes less than 88% during the night.   Admitted 05/21/17-05/27/17 with COPD exacerbation, acute on chronic diastolic CHF. Diuresed 5L with IV Lasix. Discharge weight 173 pounds.  Returns today for routine f/u with her daughter. We saw her last week for increasing SOB. She was volume overloaded. We asked her to double her lasix and she did that 2x and lost 2-3 pounds each time. She then went back to baseline lasix dose and the weight came right back + 5 pounds. She says she is trying to limit fluid intake but this has always been a struggle for her. Now SOB with minimal activty including ADLs. Weight at home typically 173 but now 183. + edema. + orthopnea and PND. No CP.    Results:  05/21/14: VQ normal 5/15: CXR: 3/16: Echo 3/16 EF 60-65% RV normal. Mild TR No RVSP measured  5/15: PFTs FEV1  1.4 (95%) FVC    1.89 (110%) FEV/FVC 88% FEF 25-75%  66% DLCO 39%  BP rest: 138/58 BP peak: 182/66 Peak VO2: 8.9 (69.1% predicted peak VO2) VE/VCO2 slope: 34.6 OUES: Peak RER: 1.24 Ventilatory Threshold: 5.8 (50% predicted and 65% measured peak VO2) Peak RR 38 Peak Ventilation: 32.7 VE/MVV: 71%  PETCO2 at peak: 36 O2pulse: 8 (100% predicted O2pulse)   Current Outpatient Medications  Medication Sig Dispense  Refill  . acetaminophen (TYLENOL) 500 MG tablet Take 1,000 mg by mouth every 6 (six) hours as needed (pain).    Marland Kitchen apixaban (ELIQUIS) 5 MG TABS tablet Take 1 tablet (5 mg total) 2 (two) times daily by mouth. 180 tablet 3  . Blood Glucose Monitoring Suppl (ONETOUCH VERIO IQ SYSTEM) w/Device KIT Use to check sugar twice daily 1 kit 0  . CALCIUM-MAG-VIT C-VIT D PO Take 1 tablet by mouth daily.    . carvedilol (COREG) 12.5 MG tablet Take 0.5 tablets (6.25 mg total) by mouth 2 (two) times daily with a meal. 45 tablet 3  .  cholecalciferol (VITAMIN D) 1000 units tablet Take 1,000 Units by mouth daily after supper.    . diclofenac sodium (VOLTAREN) 1 % GEL Apply 1 application topically 2 (two) times daily as needed (knee pain).     . furosemide (LASIX) 40 MG tablet Take 2 tablets (80 mg total) by mouth daily. 180 tablet 3  . gabapentin (NEURONTIN) 100 MG capsule Take 3 capsules (300 mg total) by mouth at bedtime. 270 capsule 3  . glucose blood (ONETOUCH VERIO) test strip Use as instructed to check sugar 2 times daily 200 each 5  . insulin NPH Human (HUMULIN N,NOVOLIN N) 100 UNIT/ML injection Inject 0.45 mLs (45 Units total) into the skin daily before breakfast. And syringes 1/day 20 mL 11  . Lancets (ONETOUCH ULTRASOFT) lancets Use to check blood sugar 2 times per day. Dx Code E11.9 200 each 2  . methimazole (TAPAZOLE) 5 MG tablet Take 5 mg by mouth 2 (two) times a week. Monday and Friday    . Multiple Vitamin (MULTIVITAMIN WITH MINERALS) TABS tablet Take 1 tablet by mouth daily. Centrum Silver    . nitroGLYCERIN (NITROSTAT) 0.4 MG SL tablet Place 0.4 mg under the tongue every 5 (five) minutes as needed for chest pain. Reported on 01/28/2016    . potassium chloride SA (K-DUR,KLOR-CON) 20 MEQ tablet TAKE 1 TABLET DAILY EVERY  OTHER DAY ALTERNATING WITH  2 TABLETS DAILY EVERY OTHER DAY 135 tablet 3  . TURMERIC PO Take 1,000 mg by mouth. With tart cherry    . umeclidinium bromide (INCRUSE ELLIPTA) 62.5 MCG/INH AEPB Inhale 1 puff into the lungs daily. 1 each 5  . Tiotropium Bromide Monohydrate (SPIRIVA RESPIMAT) 2.5 MCG/ACT AERS Inhale 2 puffs into the lungs 1 day or 1 dose for 1 dose. 3 Inhaler 3   No current facility-administered medications for this encounter.     Allergies  Allergen Reactions  . Amlodipine Besylate Other (See Comments)    REACTION: tingling in lips & gum edema  . Levaquin [Levofloxacin] Shortness Of Breath and Swelling    angioedema  . Lobster [Shellfish Allergy] Other (See Comments)    angioedema   . Penicillins Rash    Has patient had a PCN reaction causing immediate rash, facial/tongue/throat swelling, SOB or lightheadedness with hypotension: Yes Has patient had a PCN reaction causing severe rash involving mucus membranes or skin necrosis: No Has patient had a PCN reaction that required hospitalization: No Has patient had a PCN reaction occurring within the last 10 years: No If all of the above answers are "NO", then may proceed with Cephalosporin use.  . Valsartan Other (See Comments)    REACTION: angioedema  . Codeine Other (See Comments)    Mental status changes  . Lipitor [Atorvastatin] Other (See Comments)    weakness  . Oxycodone Other (See Comments)    hallucinations  . Statins  Made her too weak  . Tramadol Hcl Nausea And Vomiting   PHYSICAL EXAM: Vitals:   02/15/18 1545  BP: (!) 162/80  Pulse: 84  SpO2: 92%  Weight: 185 lb 4 oz (84 kg)   Wt Readings from Last 3 Encounters:  02/15/18 185 lb 4 oz (84 kg)  02/01/18 180 lb 12 oz (82 kg)  01/30/18 182 lb (82.6 kg)   General:  Well appearing. No resp difficulty HEENT: normal Neck: supple. JVP to jaw  Carotids 2+ bilat; no bruits. No lymphadenopathy or thryomegaly appreciated. Cor: PMI nondisplaced. Iregular rate & rhythm. No rubs, gallops or murmurs. Lungs: + basilar crackles  Abdomen: obese soft, nontender, nondistended. No hepatosplenomegaly. No bruits or masses. Good bowel sounds. Extremities: no cyanosis, clubbing, 2+ edema. Petechial rash Neuro: alert & orientedx3, cranial nerves grossly intact. moves all 4 extremities w/o difficulty. Affect pleasant   ASSESSMENT & PLAN: 1) Chronic diastolic CHF: Echo 12/7709 EF 60-65%, LA severely dilated - Volume status worsening .  - Progressive NYHA IIIB symptoms in setting of volume overload. Weight now up 10 pounds - Previous ReDS vest 29%. But now BNP rising - Double lasix to 80 bid and keep it there. Will give one dose metolazone today - Increase KCL to 40  bid - See back next week. If no improvement consider IV lasix in clinic or hospitalization, - Repeat Echo. Can consider RHC if EF low or symptoms do not improve.   2) Persistent atrial fib/flutter    This patients CHA2DS2-VASc Score and unadjusted Ischemic Stroke Rate (% per year) is equal to 7.2 % stroke rate/year from a score of 5 Above score calculated as 2 points each if present [Age > 75, or Stroke/TIA/TE] - Rate controlled. Remains on Eliquis - Denies bleeding.   3) OSA/COPD - Follows with Dr. Halford Chessman.  - Continue nightly CPAP.   4) CAD - No frank s/s angina but is taking NTG on occasion at night.  - Last cath 2015 with moderate CAD. If episodes become more frequent will need repeat cath  5) HTN - Elevated again in clinic Will assess response to lasix. Will likely need to titrate BP meds,   6) Petechial rash - Has seen Dermatology and told they were broken blood vessels  7). Arthritis - Limits her activity  - Large contributor to her deconditioning. Encouraged increased activity as tolerated. - Stable today.   Glori Bickers, MD  3:49 PM

## 2018-02-15 NOTE — Patient Instructions (Signed)
Increase Furosemide (Lasix) to 80 mg Twice daily   Increase Potassium to 40 meq (2 tabs) daily  Take Metolazone 2.5 mg TODAY  Labs today  Your physician recommends that you schedule a follow-up appointment in: 1 week

## 2018-02-16 ENCOUNTER — Other Ambulatory Visit: Payer: Self-pay | Admitting: Pharmacy Technician

## 2018-02-16 NOTE — Patient Outreach (Signed)
[facility]) Care Management  02/16/2018  [name] [birth year] [id number]  Successful outreach call to [employer] in reference to patient assistance application that was submitted 02/03/2018. The application has not been processed as of yet and currently it's take the company 3 weeks to process.  I followed up with the patient, HIPAA identifiers verified and verbal consent received. She is aware that I am still working on her application and I informed her that I would let her know once the application has been processed.  Doreene Burke, [place] [phone]

## 2018-02-22 ENCOUNTER — Ambulatory Visit: Payer: Medicare Other | Admitting: Endocrinology

## 2018-02-22 ENCOUNTER — Ambulatory Visit: Payer: Self-pay | Admitting: Pharmacy Technician

## 2018-02-23 ENCOUNTER — Ambulatory Visit (HOSPITAL_COMMUNITY)
Admission: RE | Admit: 2018-02-23 | Discharge: 2018-02-23 | Disposition: A | Discharge status: Home / Self Care | Payer: Medicare Other | Source: Ambulatory Visit | Attending: Internal Medicine | Admitting: Internal Medicine

## 2018-02-23 ENCOUNTER — Ambulatory Visit: Payer: Self-pay | Admitting: Pharmacy Technician

## 2018-02-23 ENCOUNTER — Encounter (HOSPITAL_COMMUNITY): Payer: Self-pay

## 2018-02-23 VITALS — BP 156/64 | HR 61 | Wt 178.0 lb

## 2018-02-23 DIAGNOSIS — Z880 Allergy status to penicillin: Secondary | ICD-10-CM | POA: Diagnosis not present

## 2018-02-23 DIAGNOSIS — I482 Chronic atrial fibrillation, unspecified: Secondary | ICD-10-CM

## 2018-02-23 DIAGNOSIS — J449 Chronic obstructive pulmonary disease, unspecified: Secondary | ICD-10-CM | POA: Insufficient documentation

## 2018-02-23 DIAGNOSIS — R233 Spontaneous ecchymoses: Secondary | ICD-10-CM | POA: Insufficient documentation

## 2018-02-23 DIAGNOSIS — Z79899 Other long term (current) drug therapy: Secondary | ICD-10-CM | POA: Insufficient documentation

## 2018-02-23 DIAGNOSIS — Z881 Allergy status to other antibiotic agents status: Secondary | ICD-10-CM | POA: Diagnosis not present

## 2018-02-23 DIAGNOSIS — I272 Pulmonary hypertension, unspecified: Secondary | ICD-10-CM | POA: Diagnosis not present

## 2018-02-23 DIAGNOSIS — I110 Hypertensive heart disease with heart failure: Secondary | ICD-10-CM | POA: Insufficient documentation

## 2018-02-23 DIAGNOSIS — G4733 Obstructive sleep apnea (adult) (pediatric): Secondary | ICD-10-CM | POA: Diagnosis not present

## 2018-02-23 DIAGNOSIS — I481 Persistent atrial fibrillation: Secondary | ICD-10-CM | POA: Insufficient documentation

## 2018-02-23 DIAGNOSIS — I5042 Chronic combined systolic (congestive) and diastolic (congestive) heart failure: Secondary | ICD-10-CM | POA: Diagnosis not present

## 2018-02-23 DIAGNOSIS — Z888 Allergy status to other drugs, medicaments and biological substances status: Secondary | ICD-10-CM | POA: Insufficient documentation

## 2018-02-23 DIAGNOSIS — M1990 Unspecified osteoarthritis, unspecified site: Secondary | ICD-10-CM | POA: Insufficient documentation

## 2018-02-23 DIAGNOSIS — Z794 Long term (current) use of insulin: Secondary | ICD-10-CM | POA: Diagnosis not present

## 2018-02-23 DIAGNOSIS — I2510 Atherosclerotic heart disease of native coronary artery without angina pectoris: Secondary | ICD-10-CM | POA: Diagnosis not present

## 2018-02-23 DIAGNOSIS — I10 Essential (primary) hypertension: Secondary | ICD-10-CM | POA: Diagnosis not present

## 2018-02-23 DIAGNOSIS — I5032 Chronic diastolic (congestive) heart failure: Secondary | ICD-10-CM | POA: Diagnosis not present

## 2018-02-23 DIAGNOSIS — Z885 Allergy status to narcotic agent status: Secondary | ICD-10-CM | POA: Diagnosis not present

## 2018-02-23 DIAGNOSIS — I4892 Unspecified atrial flutter: Secondary | ICD-10-CM | POA: Diagnosis not present

## 2018-02-23 DIAGNOSIS — Z91013 Allergy to seafood: Secondary | ICD-10-CM | POA: Diagnosis not present

## 2018-02-23 DIAGNOSIS — Z7901 Long term (current) use of anticoagulants: Secondary | ICD-10-CM | POA: Diagnosis not present

## 2018-02-23 LAB — BASIC METABOLIC PANEL
Anion gap: 15 (ref 5–15)
BUN: 35 mg/dL — ABNORMAL HIGH (ref 6–20)
CHLORIDE: 98 mmol/L — AB (ref 101–111)
CO2: 26 mmol/L (ref 22–32)
Calcium: 9.8 mg/dL (ref 8.9–10.3)
Creatinine, Ser: 1.22 mg/dL — ABNORMAL HIGH (ref 0.44–1.00)
GFR calc non Af Amer: 40 mL/min — ABNORMAL LOW (ref >60)
GFR, EST AFRICAN AMERICAN: 46 mL/min — AB (ref >60)
Glucose, Bld: 257 mg/dL — ABNORMAL HIGH (ref 65–99)
Potassium: 4.7 mmol/L (ref 3.5–5.1)
Sodium: 139 mmol/L (ref 135–145)

## 2018-02-23 LAB — BRAIN NATRIURETIC PEPTIDE: B Natriuretic Peptide: 490.1 pg/mL — ABNORMAL HIGH (ref 0.0–100.0)

## 2018-02-23 NOTE — Progress Notes (Signed)
Patient ID: ESSA WENK, female   DOB: Apr 18, 1931, 84 y.o.   MRN: [id number]    Advanced Heart Failure Clinic Note    Referring Physician: Lovena Le Primary Care: Billey Gosling Primary Cardiologist:Taylor  HPI: Mrs. Whitehurst is a pleasant 84 yo woman with CAD, HTN, atrial fibrillation/A flutter and chronic prior systolic heart failure, (which was likely rate related) and diastolic heart failure. She was referred by Dr. Lovena Le for further evaluation of Pulmonary HTN.   Underwent 2D echo in 3/15 which showed EF 60-65% mild LVH. RV was normal. No significant valvular abnormalities.   Underwent R/L cath 05/07/14 Which showed stable CAD and significant PH  RA 6 RV 71/7 6 PA 63/17 (33)  PCWP 19 mm Hg  LV 174/16 mm Hg  AO 171/62 mean 103 mm Hg  PVR = 2.86 Oxygen saturations:  PA 69%  AO 99%  Cardiac Output/Index (Fick) 4.88/2.7    Left mainstem: Normal.  Left anterior descending (LAD): Moderate calcification proximally. Mild irregularities less than 10%. The first diagonal has 40-50% ostial disease.  Left circumflex (LCx): The LCx gives rise to a large OM1 then terminates in the AV groove. There is 20% disease in the proximal LCx. The first OM stent is patent with diffuse 20% disease.  Right coronary artery (RCA): The RCA arises anteriorly. There is an eccentric slit like stenosis in the proximal vessel to 60-70%. The mid vessel has segmental 70-80% disease.  Left ventriculography: Left ventricular systolic function is normal, LVEF is estimated at 55-65%, there is no significant mitral regurgitation   We saw her for an initial visit in May 2105 for pHTN and DOE. At that time she was quite dyspneic with activity. HR was in 40-50 range on carvedilol 50 bid and digoxin. We stopped her digoxin and cut carvedilol back to 25 bid. PFTs, VQ and CXR were ordered. I also asked her to decrease her H2O intake. Given results of PFTs overnight oximeter was placed and referred to Dr. Gwenette Greet. She has had  overnight oximetry which showed oxygen desaturation as low as 83%, and spent 97 minutes less than 88% during the night.   Admitted 05/21/17-05/27/17 with COPD exacerbation, acute on chronic diastolic CHF. Diuresed 5L with IV Lasix. Discharge weight 173 pounds.  She returns for HF follow up. Last visit torsemide was increased to 80 mg twice a day and received 1 dose of metolazone. Overall feeling fine. Denies SOB/PND/Orthopnea. Mild dyspnea with steps. Appetite ok. No fever or chills. Weight at home has been trending down from 185 to 180 pounds.  Following low salt diet. Taking all medications.  Results:  05/21/14: VQ normal 5/15: CXR: 3/16: Echo 3/16 EF 60-65% RV normal. Mild TR No RVSP measured  5/15: PFTs FEV1  1.4 (95%) FVC    1.89 (110%) FEV/FVC 88% FEF 25-75%  66% DLCO 39%  BP rest: 138/58 BP peak: 182/66 Peak VO2: 8.9 (69.1% predicted peak VO2) VE/VCO2 slope: 34.6 OUES: Peak RER: 1.24 Ventilatory Threshold: 5.8 (50% predicted and 65% measured peak VO2) Peak RR 38 Peak Ventilation: 32.7 VE/MVV: 71%  PETCO2 at peak: 36 O2pulse: 8 (100% predicted O2pulse)   Current Outpatient Medications  Medication Sig Dispense Refill  . acetaminophen (TYLENOL) 500 MG tablet Take 1,000 mg by mouth every 6 (six) hours as needed (pain).    Marland Kitchen apixaban (ELIQUIS) 5 MG TABS tablet Take 1 tablet (5 mg total) 2 (two) times daily by mouth. 180 tablet 3  . Blood Glucose Monitoring Suppl (ONETOUCH VERIO IQ SYSTEM) w/Device  KIT Use to check sugar twice daily 1 kit 0  . CALCIUM-MAG-VIT C-VIT D PO Take 1 tablet by mouth daily.    . carvedilol (COREG) 12.5 MG tablet Take 0.5 tablets (6.25 mg total) by mouth 2 (two) times daily with a meal. 45 tablet 3  . cholecalciferol (VITAMIN D) 1000 units tablet Take 1,000 Units by mouth daily after supper.    . diclofenac sodium (VOLTAREN) 1 % GEL Apply 1 application topically 2 (two) times daily as needed (knee pain).     . furosemide (LASIX) 80 MG tablet Take 1  tablet (80 mg total) by mouth 2 (two) times daily. 60 tablet 6  . gabapentin (NEURONTIN) 100 MG capsule Take 3 capsules (300 mg total) by mouth at bedtime. 270 capsule 3  . glucose blood (ONETOUCH VERIO) test strip Use as instructed to check sugar 2 times daily 200 each 5  . insulin NPH Human (HUMULIN N,NOVOLIN N) 100 UNIT/ML injection Inject 0.45 mLs (45 Units total) into the skin daily before breakfast. And syringes 1/day 20 mL 11  . Lancets (ONETOUCH ULTRASOFT) lancets Use to check blood sugar 2 times per day. Dx Code E11.9 200 each 2  . methimazole (TAPAZOLE) 5 MG tablet Take 5 mg by mouth 2 (two) times a week. Monday and Friday    . metolazone (ZAROXOLYN) 2.5 MG tablet Take 1 tablet (2.5 mg total) by mouth as directed. 5 tablet 3  . Multiple Vitamin (MULTIVITAMIN WITH MINERALS) TABS tablet Take 1 tablet by mouth daily. Centrum Silver    . nitroGLYCERIN (NITROSTAT) 0.4 MG SL tablet Place 0.4 mg under the tongue every 5 (five) minutes as needed for chest pain. Reported on 01/28/2016    . potassium chloride SA (K-DUR,KLOR-CON) 20 MEQ tablet Take 2 tablets (40 mEq total) by mouth daily. 60 tablet 6  . Tiotropium Bromide Monohydrate (SPIRIVA RESPIMAT) 2.5 MCG/ACT AERS Inhale 2 puffs into the lungs 1 day or 1 dose for 1 dose. 3 Inhaler 3  . TURMERIC PO Take 1,000 mg by mouth. With tart cherry    . umeclidinium bromide (INCRUSE ELLIPTA) 62.5 MCG/INH AEPB Inhale 1 puff into the lungs daily. 1 each 5   No current facility-administered medications for this encounter.     Allergies  Allergen Reactions  . Amlodipine Besylate Other (See Comments)    REACTION: tingling in lips & gum edema  . Levaquin [Levofloxacin] Shortness Of Breath and Swelling    angioedema  . Lobster [Shellfish Allergy] Other (See Comments)    angioedema  . Penicillins Rash    Has patient had a PCN reaction causing immediate rash, facial/tongue/throat swelling, SOB or lightheadedness with hypotension: Yes Has patient had a PCN  reaction causing severe rash involving mucus membranes or skin necrosis: No Has patient had a PCN reaction that required hospitalization: No Has patient had a PCN reaction occurring within the last 10 years: No If all of the above answers are "NO", then may proceed with Cephalosporin use.  . Valsartan Other (See Comments)    REACTION: angioedema  . Codeine Other (See Comments)    Mental status changes  . Lipitor [Atorvastatin] Other (See Comments)    weakness  . Oxycodone Other (See Comments)    hallucinations  . Statins     Made her too weak  . Tramadol Hcl Nausea And Vomiting   PHYSICAL EXAM: Vitals:   02/23/18 1219  BP: (!) 156/64  Pulse: 61  SpO2: 95%  Weight: 178 lb (80.7 kg)   Wt  Readings from Last 3 Encounters:  02/23/18 178 lb (80.7 kg)  02/15/18 185 lb 4 oz (84 kg)  02/01/18 180 lb 12 oz (82 kg)   General:  Well appearing. No resp difficulty HEENT: normal Neck: supple. JVP 5-6. Carotids 2+ bilat; no bruits. No lymphadenopathy or thryomegaly appreciated. Cor: PMI nondisplaced. Irregular rate & rhythm. No rubs, gallops or murmurs. Lungs: clear Abdomen: soft, nontender, nondistended. No hepatosplenomegaly. No bruits or masses. Good bowel sounds. Extremities: no cyanosis, clubbing, rash, edema Neuro: alert & orientedx3, cranial nerves grossly intact. moves all 4 extremities w/o difficulty. Affect pleasant  ASSESSMENT & PLAN: 1) Chronic diastolic CHF: Echo 01/7077 EF 60-65%, LA severely dilated -NYHA II. Volume status much improved. Continue lasix 80 mg twice a day.  Check BMET and BNP today.  - Continue KCL to 40 bid -  2) Persistent atrial fib/flutter    This patients CHA2DS2-VASc Score and unadjusted Ischemic Stroke Rate (% per year) is equal to 7.2 % stroke rate/year from a score of 5 Above score calculated as 2 points each if present [Age > 75, or Stroke/TIA/TE] - Rate controlled.   Remains on Eliquis - Denies bleeding.   3) OSA/COPD - Follows with Dr.  Halford Chessman.  - Continue nightly CPAP.   4) CAD - Last cath 2015 with moderate CAD.  No S/S ischemia   5) Petechial rash - Has seen Dermatology and told they were broken blood vessels  6). Arthritis  Check BMET BNP today. Follow up 6-8 weeks or sooner if weight goes up.   Darrick Grinder, NP  12:18 PM

## 2018-02-23 NOTE — Addendum Note (Signed)
Encounter addended by: Scarlette Calico, RN on: 02/23/2018 1:48 PM  Actions taken: Sign clinical note

## 2018-02-23 NOTE — Progress Notes (Signed)
Medication Samples have been provided to the patient.  Drug name: Eliquis       Strength: 5mg         Qty: 2  LOT: [id number] Exp.Date: 2/21  Dosing instructions: 1 tab Twice daily   The patient has been instructed regarding the correct time, dose, and frequency of taking this medication, including desired effects and most common side effects.   [name] 1:46 PM 02/23/2018

## 2018-02-23 NOTE — Patient Instructions (Signed)
Routine lab work today. Will notify you of abnormal results, otherwise no news is good news!  Follow up 2 months with Dr. Haroldine Laws.  Take all medication as prescribed the day of your appointment. Bring all medications with you to your appointment.  Do the following things EVERYDAY: 1) Weigh yourself in the morning before breakfast. Write it down and keep it in a log. 2) Take your medicines as prescribed 3) Eat low salt foods-Limit salt (sodium) to 2000 mg per day.  4) Stay as active as you can everyday 5) Limit all fluids for the day to less than 2 liters

## 2018-02-26 ENCOUNTER — Other Ambulatory Visit: Payer: Self-pay | Admitting: Endocrinology

## 2018-02-26 ENCOUNTER — Other Ambulatory Visit (HOSPITAL_COMMUNITY): Payer: Self-pay | Admitting: Internal Medicine

## 2018-03-06 ENCOUNTER — Ambulatory Visit: Payer: Medicare Other | Admitting: Dietician

## 2018-03-07 ENCOUNTER — Other Ambulatory Visit: Payer: Self-pay | Admitting: Internal Medicine

## 2018-03-07 ENCOUNTER — Ambulatory Visit: Payer: Self-pay | Admitting: Pharmacy Technician

## 2018-03-07 ENCOUNTER — Other Ambulatory Visit: Payer: Self-pay | Admitting: Pharmacy Technician

## 2018-03-07 DIAGNOSIS — Z139 Encounter for screening, unspecified: Secondary | ICD-10-CM

## 2018-03-07 NOTE — Patient Outreach (Signed)
[facility]) Care Management  03/07/2018  ADDILYNE BACKS [birth year] [id number]   Outgoing call to [employer] patient assistance program in reference to an application submitted for Spiriva Respimat. The application was denied due to patient's income.  Contacted patient to update her of the status of her application. HIPAA identifiers verified and verbal consent received. I am routing a message to Denyse Amass, [place] for case closure.  Doreene Burke, [place] [phone]

## 2018-03-08 ENCOUNTER — Encounter: Payer: Self-pay | Admitting: Endocrinology

## 2018-03-08 ENCOUNTER — Ambulatory Visit: Payer: Medicare Other | Admitting: Endocrinology

## 2018-03-08 ENCOUNTER — Telehealth: Payer: Self-pay | Admitting: Pharmacist

## 2018-03-08 VITALS — BP 140/68 | HR 67 | Wt 177.0 lb

## 2018-03-08 DIAGNOSIS — E0590 Thyrotoxicosis, unspecified without thyrotoxic crisis or storm: Secondary | ICD-10-CM | POA: Diagnosis not present

## 2018-03-08 LAB — T4, FREE: Free T4: 0.8 ng/dL (ref 0.60–1.60)

## 2018-03-08 LAB — TSH: TSH: 2.35 u[IU]/mL (ref 0.35–4.50)

## 2018-03-08 MED ORDER — INSULIN NPH ISOPHANE & REGULAR (70-30) 100 UNIT/ML ~~LOC~~ SUSP: 40.0000 [IU] | Freq: Every day | SUBCUTANEOUS | 11 refills | Status: DC

## 2018-03-08 NOTE — Patient Outreach (Signed)
[facility]) Care Management  03/08/2018  [name] October 14, 1931 [id number]   Patient case closed. Patient was over income for medication assistance. [place] spoke with patient. Patient is aware.   Elayne Guerin, PharmD, [place] Clinical Pharmacist [phone]

## 2018-03-08 NOTE — Patient Instructions (Addendum)
check your blood sugar twice a day.  vary the time of day when you check, between before the 3 meals, and at bedtime.  also check if you have symptoms of your blood sugar being too high or too low.  please keep a record of the readings and bring it to your next appointment here.  please call us sooner if your blood sugar goes below 70, or if you have a lot of readings over 200.      I have sent a prescription to your pharmacy, to change the insulin to "70/30," 40 units with breakfast.   On this type of insulin schedule, you should eat meals on a regular schedule.  If a meal is missed or significantly delayed, your blood sugar could go low.   blood tests are requested for you today.  We'll let you know about the results. Please stop taking the methimazole.  Please come back for a follow-up appointment in 2 months.

## 2018-03-08 NOTE — Progress Notes (Signed)
Subjective:    Patient ID: [name], female    DOB: [birth year], 84 y.o.   MRN: [id number]  HPI Pt returns for f/u of diabetes mellitus:  DM type: Insulin-requiring type 2.   Dx'ed: 1093 Complications: CAD, polyneuropathy, and renal insufficiency.  Therapy: insulin since 2007.  GDM: never.  DKA: never. Severe hypoglycemia: never.  Pancreatitis: never.  Other: she changed to qd insulin, after poor results with multiple daily injections; she was changed from lantus to QAM [facility], due to am hypoglycemia and PM hyperglycemia.   Interval history:  Pt says she never misses the insulin, but she takes just 40 units qam, due to frequent fasting hypoglycemia (50).   It is in general higher as the day goes on (180).  Pt also has hyperthyroidism (dx'ed 2012; [place] then showed multinodular goiter; tapazole was chosen as rx, due to low uptake on nuc med scan).   Past Medical History:  Diagnosis Date  . Anemia    iron defficiency  . Anginal pain ([place])   . Arthritis    "knees, feet, hands; joints" (05/27/2015)  . Asthma   . Atrial fibrillation or flutter    s/p RFCA 7/08;   s/p DCCV in past;   previously on amiodarone;  amio stopped due to lung toxicity  . CAD (coronary artery disease)    s/p NSTEMI tx with BMS to OM1 3/08;  cath 3/08: pOM 99% tx with PCI, pLAD 20%, ? mod stenosis at the AM  . CAP (community acquired pneumonia) 05/24/2015  . Chronic diastolic heart failure (HCC)    echo 11/11:  EF 55-60%, severe LVH, mod LAE, mild MR, mildly increased PASP  . COPD (chronic obstructive pulmonary disease) ([place])   . Degenerative joint disease   . Dystrophy, corneal stromal   . Heart murmur   . HLD (hyperlipidemia)   . HTN (hypertension)    essential nos  . Hypopotassemia    PMH of  . Muscle pain   . Myocardial infarction ([place]) 2008  . OSA on CPAP   . Osteoporosis   . Pneumonia 05/27/2015  . Protein calorie malnutrition ([place])   . Rash and nonspecific skin eruption    both arms,awaiting  bio   dr Delman Cheadle  . Seasonal allergies   . Shortness of breath dyspnea   . Type II diabetes mellitus ([place])    Dr Loanne Drilling    Past Surgical History:  Procedure Laterality Date  . A FLUTTER ABLATION     Dr Lovena Le  . CATARACT EXTRACTION W/ INTRAOCULAR LENS  IMPLANT, BILATERAL Bilateral   . CHOLECYSTECTOMY    . COLONOSCOPY  6/07   2 polyps Dr. Kinnie Feil in [place]  . colonoscopy with polypectomy      X 2; [facility]  . CORNEAL TRANSPLANT Bilateral   . CORONARY ANGIOPLASTY WITH STENT PLACEMENT  02/2007   BMS w/Dr Lovena Le  . EYE SURGERY    . gravid 2 para 2    . KNEE CARTILAGE SURGERY Right   . LEFT AND RIGHT HEART CATHETERIZATION WITH CORONARY ANGIOGRAM N/A 05/07/2014   Procedure: LEFT AND RIGHT HEART CATHETERIZATION WITH CORONARY ANGIOGRAM;  Surgeon: Peter M Martinique, MD;  Location: [facility] CATH LAB;  Service: Cardiovascular;  Laterality: N/A;  . TOTAL KNEE ARTHROPLASTY Right 06/28/2016   Procedure: TOTAL KNEE ARTHROPLASTY;  Surgeon: Frederik Pear, MD;  Location: [facility];  Service: Orthopedics;  Laterality: Right;    Social History   Socioeconomic History  . Marital status: Widowed  Spouse name: Not on file  . Number of children: Not on file  . Years of education: Not on file  . Highest education level: Not on file  Social Needs  . Financial resource strain: Not on file  . Food insecurity - worry: Not on file  . Food insecurity - inability: Not on file  . Transportation needs - medical: Not on file  . Transportation needs - non-medical: Not on file  Occupational History  . Occupation: Pharmacist, hospital  Tobacco Use  . Smoking status: Former Smoker    Packs/day: 0.25    Years: 18.00    Pack years: 4.50    Types: Cigarettes    Last attempt to quit: 12/27/1968    Years since quitting: 49.2  . Smokeless tobacco: Never Used  . Tobacco comment: smoked [place], up to 1 pp week  Substance and Sexual Activity  . Alcohol use: Yes    Comment: occ  . Drug use: No  . Sexual activity: No  Other Topics  Concern  . Not on file  Social History Narrative   Teacher. Widowed. Rarely drinks cafeine.     Current Outpatient Medications on File Prior to Visit  Medication Sig Dispense Refill  . acetaminophen (TYLENOL) 500 MG tablet Take 1,000 mg by mouth every 6 (six) hours as needed (pain).    Marland Kitchen apixaban (ELIQUIS) 5 MG TABS tablet Take 1 tablet (5 mg total) 2 (two) times daily by mouth. 180 tablet 3  . Blood Glucose Monitoring Suppl (ONETOUCH VERIO IQ SYSTEM) w/Device KIT Use to check sugar twice daily 1 kit 0  . CALCIUM-MAG-VIT C-VIT D PO Take 1 tablet by mouth daily.    . carvedilol (COREG) 12.5 MG tablet Take 0.5 tablets (6.25 mg total) by mouth 2 (two) times daily with a meal. 45 tablet 3  . cholecalciferol (VITAMIN D) 1000 units tablet Take 1,000 Units by mouth daily after supper.    . diclofenac sodium (VOLTAREN) 1 % GEL Apply 1 application topically 2 (two) times daily as needed (knee pain).     . furosemide (LASIX) 80 MG tablet Take 1 tablet (80 mg total) by mouth 2 (two) times daily. 60 tablet 6  . gabapentin (NEURONTIN) 100 MG capsule Take 3 capsules (300 mg total) by mouth at bedtime. 270 capsule 3  . glucose blood (ONETOUCH VERIO) test strip Use as instructed to check sugar 2 times daily 200 each 5  . Lancets (ONETOUCH ULTRASOFT) lancets Use to check blood sugar 2 times per day. Dx Code E11.9 200 each 2  . Multiple Vitamin (MULTIVITAMIN WITH MINERALS) TABS tablet Take 1 tablet by mouth daily. Centrum Silver    . nitroGLYCERIN (NITROSTAT) 0.4 MG SL tablet Place 0.4 mg under the tongue every 5 (five) minutes as needed for chest pain. Reported on 01/28/2016    . potassium chloride SA (K-DUR,KLOR-CON) 20 MEQ tablet Take 2 tablets (40 mEq total) by mouth daily. (Patient taking differently: Take 20 mEq by mouth daily. ) 60 tablet 6  . TURMERIC PO Take 1,000 mg by mouth. With tart cherry    . umeclidinium bromide (INCRUSE ELLIPTA) 62.5 MCG/INH AEPB Inhale 1 puff into the lungs daily. 1 each 5  .  carvedilol (COREG) 12.5 MG tablet Take 0.5 tablets (6.25 mg total) by mouth 2 (two) times daily with a meal. 45 tablet 3  . metolazone (ZAROXOLYN) 2.5 MG tablet Take 1 tablet (2.5 mg total) by mouth as directed. (Patient not taking: Reported on 03/08/2018) 5 tablet 3  .  Tiotropium Bromide Monohydrate (SPIRIVA RESPIMAT) 2.5 MCG/ACT AERS Inhale 2 puffs into the lungs 1 day or 1 dose for 1 dose. 3 Inhaler 3   No current facility-administered medications on file prior to visit.     Allergies  Allergen Reactions  . Amlodipine Besylate Other (See Comments)    REACTION: tingling in lips & gum edema  . Levaquin [Levofloxacin] Shortness Of Breath and Swelling    angioedema  . Lobster [Shellfish Allergy] Other (See Comments)    angioedema  . Penicillins Rash    Has patient had a PCN reaction causing immediate rash, facial/tongue/throat swelling, SOB or lightheadedness with hypotension: Yes Has patient had a PCN reaction causing severe rash involving mucus membranes or skin necrosis: No Has patient had a PCN reaction that required hospitalization: No Has patient had a PCN reaction occurring within the last 10 years: No If all of the above answers are "NO", then may proceed with Cephalosporin use.  . Valsartan Other (See Comments)    REACTION: angioedema  . Codeine Other (See Comments)    Mental status changes  . Lipitor [Atorvastatin] Other (See Comments)    weakness  . Oxycodone Other (See Comments)    hallucinations  . Statins     Made her too weak  . Tramadol Hcl Nausea And Vomiting    Family History  Problem Relation Age of Onset  . Diabetes Mother   . Hypertension Mother   . Transient ischemic attack Mother   . Arthritis Mother   . Heart attack Father 31  . Arthritis Father   . Hypertension Father   . Breast cancer Maternal Aunt   . Arthritis Maternal Aunt     BP 140/68 (BP Location: Left Arm, Patient Position: Sitting, Cuff Size: Normal)   Pulse 67   Wt 177 lb (80.3 kg)    SpO2 94%   BMI 32.37 kg/m    Review of Systems Denies fever.  She has a rash on her legs and feet (saw derm--no cause was found).     Objective:   Physical Exam VITAL SIGNS:  See vs page GENERAL: no distress Pulses: foot pulses are intact bilaterally.   MSK: no deformity of the feet or ankles.  CV: trace bilat edema of the legs Skin:  no ulcer on the feet or ankles.  normal temp on the feet and ankles.  There is a petechial rash on the legs and feet Neuro: sensation is intact to touch on the feet and ankles.     Lab Results  Component Value Date   HGBA1C 8.0 (H) 01/30/2018   Lab Results  Component Value Date   CREATININE 1.22 (H) 02/23/2018   BUN 35 (H) 02/23/2018   NA 139 02/23/2018   K 4.7 02/23/2018   CL 98 (L) 02/23/2018   CO2 26 02/23/2018      Assessment & Plan:  Hyperthyroidism: due for recheck.  Plan will be for RAI when this recurs off rx.  Rash, new to me: poss due to methimazole Insulin-requiring type 2 DM: she needs increased rx. Fasting hypoglycemia: he needs a faster-acting qd insulin.  This is prob due to renal insuff.    Patient Instructions  check your blood sugar twice a day.  vary the time of day when you check, between before the 3 meals, and at bedtime.  also check if you have symptoms of your blood sugar being too high or too low.  please keep a record of the readings and bring it to  your next appointment here.  please call us sooner if your blood sugar goes below 70, or if you have a lot of readings over 200.      I have sent a prescription to your pharmacy, to change the insulin to "70/30," 40 units with breakfast.   On this type of insulin schedule, you should eat meals on a regular schedule.  If a meal is missed or significantly delayed, your blood sugar could go low.   blood tests are requested for you today.  We'll let you know about the results. Please stop taking the methimazole.  Please come back for a follow-up appointment in 2 months.

## 2018-03-09 ENCOUNTER — Telehealth: Payer: Self-pay | Admitting: Internal Medicine

## 2018-03-09 DIAGNOSIS — L600 Ingrowing nail: Secondary | ICD-10-CM

## 2018-03-09 DIAGNOSIS — N183 Chronic kidney disease, stage 3 unspecified: Secondary | ICD-10-CM

## 2018-03-09 DIAGNOSIS — Z794 Long term (current) use of insulin: Principal | ICD-10-CM

## 2018-03-09 DIAGNOSIS — G4733 Obstructive sleep apnea (adult) (pediatric): Secondary | ICD-10-CM | POA: Diagnosis not present

## 2018-03-09 DIAGNOSIS — E1122 Type 2 diabetes mellitus with diabetic chronic kidney disease: Secondary | ICD-10-CM

## 2018-03-09 NOTE — Telephone Encounter (Signed)
Copied from [place]. Topic: Inquiry >> Mar 09, 2018  3:49 PM Scherrie Gerlach wrote: Reason for CRM: pt saw Shan Levans yesterday and wants to know it there are any meds that Dr Quay Burow prescribes that she can stop taking.  Pt has a rash on her feet and ankles and getting worse. Pt seems to thinks rash may have first started when she started the gabapentin (NEURONTIN) 100 MG capsule  Pt does not know now if she was supposed to take gabapentin long term, but since she had refills, she continued to take. So Dr Loanne Drilling asked pt to reach out to Dr Quay Burow and Dr Valora Piccolo (the 2 drs she gets meds from) and see if anything she can stop taking.

## 2018-03-09 NOTE — Telephone Encounter (Signed)
Copied from [place] [id number]. Topic: Referral - Request >> Mar 09, 2018  3:55 PM Scherrie Gerlach wrote: Reason for CRM: pt would like Dr Quay Burow to refer her to a podiatrist.  Pt states she has an ingrown toenail that has been cut by the manicurist at salon. But she has started to have alot of pain in that toe, and doesn't want to take any chances since she is a diabetic.

## 2018-03-10 ENCOUNTER — Telehealth (HOSPITAL_COMMUNITY): Payer: Self-pay | Admitting: *Deleted

## 2018-03-10 NOTE — Telephone Encounter (Signed)
She saw Dermatology and they didn't think it was related to any meds.

## 2018-03-10 NOTE — Addendum Note (Signed)
Addended by: [name] on: 03/10/2018 12:15 PM   Modules accepted: Orders

## 2018-03-10 NOTE — Telephone Encounter (Signed)
Left detailed message for pt with provider response.

## 2018-03-10 NOTE — Telephone Encounter (Signed)
Patient saw Dr. Loanne Drilling yesterday regarding a rash on her feet and ankles.  She seems to think it was caused by gabapentin but while she was there Dr. Loanne Drilling asked her to follow up with all her doctors and see if there are any medications that she may be able to stop.    I told patient that Dr. Haroldine Laws will likely want her to continue all her cardiac medications as prescribed but will still forward message to him.  Will call patient if any changes are made.

## 2018-03-10 NOTE — Telephone Encounter (Signed)
Podiatry referral ordered.  She can stop the gabapentin.  It is not something that she has to take long-term-refills were only if it was of benefit.  She was taking it for the back pain and leg pain.  She is on a low dose and does not need to taper down-okay to stop.

## 2018-03-10 NOTE — Addendum Note (Signed)
Addended by: Terence Lux B on: 03/10/2018 11:19 AM   Modules accepted: Orders

## 2018-03-16 ENCOUNTER — Ambulatory Visit (INDEPENDENT_AMBULATORY_CARE_PROVIDER_SITE_OTHER): Payer: Medicare Other

## 2018-03-16 ENCOUNTER — Telehealth: Payer: Self-pay | Admitting: *Deleted

## 2018-03-16 ENCOUNTER — Ambulatory Visit: Payer: Medicare Other | Admitting: Podiatry

## 2018-03-16 ENCOUNTER — Encounter: Payer: Self-pay | Admitting: Podiatry

## 2018-03-16 DIAGNOSIS — L600 Ingrowing nail: Secondary | ICD-10-CM

## 2018-03-16 DIAGNOSIS — M2042 Other hammer toe(s) (acquired), left foot: Secondary | ICD-10-CM

## 2018-03-16 DIAGNOSIS — M2041 Other hammer toe(s) (acquired), right foot: Secondary | ICD-10-CM

## 2018-03-16 DIAGNOSIS — B351 Tinea unguium: Secondary | ICD-10-CM | POA: Diagnosis not present

## 2018-03-16 DIAGNOSIS — M79676 Pain in unspecified toe(s): Secondary | ICD-10-CM

## 2018-03-16 MED ORDER — NEOMYCIN-POLYMYXIN-HC 1 % OT SOLN: OTIC | 1 refills | Status: DC

## 2018-03-16 MED ORDER — DOXYCYCLINE HYCLATE 100 MG PO TABS: 100.0000 mg | ORAL_TABLET | Freq: Two times a day (BID) | ORAL | 0 refills | Status: DC

## 2018-03-16 NOTE — Patient Instructions (Signed)

## 2018-03-16 NOTE — Progress Notes (Signed)
Subjective:  Patient ID: [name], female    DOB: [birth year],  MRN: [id number] HPI Chief Complaint  Patient presents with  . Toe Pain    Hallux nails bilateral - medial borders (R>L) - ingrowns x several months, last few weeks gotten red, swollen, and more painful, gets regular pedicures and they usually trim out  . Toe Pain    4th toe right - developing corn, tender sometimes, also concerned about darkened speckles on legs and feet  . Diabetes    Last a1c was 8.0  . New Patient (Initial Visit)    84 y.o. female presents with the above complaint.   Denies fever chills nausea vomiting muscle aches pains calf pain chest pain shortness of breath and headache.  Past Medical History:  Diagnosis Date  . Anemia    iron defficiency  . Anginal pain ([place])   . Arthritis    "knees, feet, hands; joints" (05/27/2015)  . Asthma   . Atrial fibrillation or flutter    s/p RFCA 7/08;   s/p DCCV in past;   previously on amiodarone;  amio stopped due to lung toxicity  . CAD (coronary artery disease)    s/p NSTEMI tx with BMS to OM1 3/08;  cath 3/08: pOM 99% tx with PCI, pLAD 20%, ? mod stenosis at the AM  . CAP (community acquired pneumonia) 05/24/2015  . Chronic diastolic heart failure (HCC)    echo 11/11:  EF 55-60%, severe LVH, mod LAE, mild MR, mildly increased PASP  . COPD (chronic obstructive pulmonary disease) ([place])   . Degenerative joint disease   . Dystrophy, corneal stromal   . Heart murmur   . HLD (hyperlipidemia)   . HTN (hypertension)    essential nos  . Hypopotassemia    PMH of  . Muscle pain   . Myocardial infarction ([place]) 2008  . OSA on CPAP   . Osteoporosis   . Pneumonia 05/27/2015  . Protein calorie malnutrition ([place])   . Rash and nonspecific skin eruption    both arms,awaiting bio   dr Delman Cheadle  . Seasonal allergies   . Shortness of breath dyspnea   . Type II diabetes mellitus ([place])    Dr Loanne Drilling   Past Surgical History:  Procedure Laterality Date  . A  FLUTTER ABLATION     Dr Lovena Le  . CATARACT EXTRACTION W/ INTRAOCULAR LENS  IMPLANT, BILATERAL Bilateral   . CHOLECYSTECTOMY    . COLONOSCOPY  6/07   2 polyps Dr. Kinnie Feil in [place]  . colonoscopy with polypectomy      X 2; [facility]  . CORNEAL TRANSPLANT Bilateral   . CORONARY ANGIOPLASTY WITH STENT PLACEMENT  02/2007   BMS w/Dr Lovena Le  . EYE SURGERY    . gravid 2 para 2    . KNEE CARTILAGE SURGERY Right   . LEFT AND RIGHT HEART CATHETERIZATION WITH CORONARY ANGIOGRAM N/A 05/07/2014   Procedure: LEFT AND RIGHT HEART CATHETERIZATION WITH CORONARY ANGIOGRAM;  Surgeon: Peter M Martinique, MD;  Location: [facility] CATH LAB;  Service: Cardiovascular;  Laterality: N/A;  . TOTAL KNEE ARTHROPLASTY Right 06/28/2016   Procedure: TOTAL KNEE ARTHROPLASTY;  Surgeon: Frederik Pear, MD;  Location: [facility];  Service: Orthopedics;  Laterality: Right;    Current Outpatient Medications:  .  acetaminophen (TYLENOL) 500 MG tablet, Take 1,000 mg by mouth every 6 (six) hours as needed (pain)., Disp: , Rfl:  .  apixaban (ELIQUIS) 5 MG TABS tablet, Take 1 tablet (5 mg total) 2 (two)  times daily by mouth., Disp: 180 tablet, Rfl: 3 .  Blood Glucose Monitoring Suppl (ONETOUCH VERIO IQ SYSTEM) w/Device KIT, Use to check sugar twice daily, Disp: 1 kit, Rfl: 0 .  CALCIUM-MAG-VIT C-VIT D PO, Take 1 tablet by mouth daily., Disp: , Rfl:  .  carvedilol (COREG) 12.5 MG tablet, Take 0.5 tablets (6.25 mg total) by mouth 2 (two) times daily with a meal., Disp: 45 tablet, Rfl: 3 .  carvedilol (COREG) 12.5 MG tablet, Take 0.5 tablets (6.25 mg total) by mouth 2 (two) times daily with a meal., Disp: 45 tablet, Rfl: 3 .  cholecalciferol (VITAMIN D) 1000 units tablet, Take 1,000 Units by mouth daily after supper., Disp: , Rfl:  .  diclofenac sodium (VOLTAREN) 1 % GEL, Apply 1 application topically 2 (two) times daily as needed (knee pain). , Disp: , Rfl:  .  doxycycline (VIBRA-TABS) 100 MG tablet, Take 1 tablet (100 mg total) by mouth 2 (two) times  daily., Disp: 20 tablet, Rfl: 0 .  furosemide (LASIX) 80 MG tablet, Take 1 tablet (80 mg total) by mouth 2 (two) times daily., Disp: 60 tablet, Rfl: 6 .  gabapentin (NEURONTIN) 100 MG capsule, Take 3 capsules (300 mg total) by mouth at bedtime., Disp: 270 capsule, Rfl: 3 .  glucose blood (ONETOUCH VERIO) test strip, Use as instructed to check sugar 2 times daily, Disp: 200 each, Rfl: 5 .  insulin NPH-regular Human (NOVOLIN 70/30 RELION) (70-30) 100 UNIT/ML injection, Inject 40 Units into the skin daily with breakfast. And syringes 1/day, Disp: 10 mL, Rfl: 11 .  Lancets (ONETOUCH ULTRASOFT) lancets, Use to check blood sugar 2 times per day. Dx Code E11.9, Disp: 200 each, Rfl: 2 .  metolazone (ZAROXOLYN) 2.5 MG tablet, Take 1 tablet (2.5 mg total) by mouth as directed. (Patient not taking: Reported on 03/08/2018), Disp: 5 tablet, Rfl: 3 .  Multiple Vitamin (MULTIVITAMIN WITH MINERALS) TABS tablet, Take 1 tablet by mouth daily. Centrum Silver, Disp: , Rfl:  .  NEOMYCIN-POLYMYXIN-HYDROCORTISONE (CORTISPORIN) 1 % SOLN OTIC solution, Apply 1-2 drops to toe BID after soaking, Disp: 10 mL, Rfl: 1 .  nitroGLYCERIN (NITROSTAT) 0.4 MG SL tablet, Place 0.4 mg under the tongue every 5 (five) minutes as needed for chest pain. Reported on 01/28/2016, Disp: , Rfl:  .  potassium chloride SA (K-DUR,KLOR-CON) 20 MEQ tablet, Take 2 tablets (40 mEq total) by mouth daily. (Patient taking differently: Take 20 mEq by mouth daily. ), Disp: 60 tablet, Rfl: 6 .  Tiotropium Bromide Monohydrate (SPIRIVA RESPIMAT) 2.5 MCG/ACT AERS, Inhale 2 puffs into the lungs 1 day or 1 dose for 1 dose., Disp: 3 Inhaler, Rfl: 3 .  TURMERIC PO, Take 1,000 mg by mouth. With tart cherry, Disp: , Rfl:  .  umeclidinium bromide (INCRUSE ELLIPTA) 62.5 MCG/INH AEPB, Inhale 1 puff into the lungs daily., Disp: 1 each, Rfl: 5  Allergies  Allergen Reactions  . Amlodipine Besylate Other (See Comments)    REACTION: tingling in lips & gum edema  . Levaquin  [Levofloxacin] Shortness Of Breath and Swelling    angioedema  . Lobster [Shellfish Allergy] Other (See Comments)    angioedema  . Penicillins Rash    Has patient had a PCN reaction causing immediate rash, facial/tongue/throat swelling, SOB or lightheadedness with hypotension: Yes Has patient had a PCN reaction causing severe rash involving mucus membranes or skin necrosis: No Has patient had a PCN reaction that required hospitalization: No Has patient had a PCN reaction occurring within the last  10 years: No If all of the above answers are "NO", then may proceed with Cephalosporin use.  . Valsartan Other (See Comments)    REACTION: angioedema  . Codeine Other (See Comments)    Mental status changes  . Lipitor [Atorvastatin] Other (See Comments)    weakness  . Oxycodone Other (See Comments)    hallucinations  . Statins     Made her too weak  . Tramadol   . Tramadol Hcl Nausea And Vomiting   Review of Systems Objective:  There were no vitals filed for this visit.  General: Well developed, nourished, in no acute distress, alert and oriented x3   Dermatological: Skin is warm, dry and supple bilateral. Nails x 10 are well maintained; remaining integument appears unremarkable at this time. There are no open sores, no preulcerative lesions, no rash or signs of infection present.  Vascular: Dorsalis Pedis artery and Posterior Tibial artery pedal pulses are 2/4 bilateral with immedate capillary fill time. Pedal hair growth present. No varicosities and no lower extremity edema present bilateral.   Neruologic: Grossly intact via light touch bilateral. Vibratory intact via tuning fork bilateral. Protective threshold with Semmes Wienstein monofilament intact to all pedal sites bilateral. Patellar and Achilles deep tendon reflexes 2+ bilateral. No Babinski or clonus noted bilateral.   Musculoskeletal: No gross boney pedal deformities bilateral. No pain, crepitus, or limitation noted with foot  and ankle range of motion bilateral. Muscular strength 5/5 in all groups tested bilateral.  Gait: Unassisted, Nonantalgic.    Radiographs:  No acute findings.  Assessment & Plan:   Assessment: Ingrown toenails cannot rule out osteomyelitis hallux right  Plan: Discussed etiology pathology conservative versus surgical therapies.  And after consent we went ahead and removed permanently the tibial borders of the hallux bilaterally.  This was performed after 3 cc of a 50-50 mixture of Marcaine plain lidocaine plain was infiltrated in a hallux block.  The medial border of the hallux nails were removed and phenol was applied to the nail root.  It was noticed that on the tibial border of the right hallux there does appear to be an area that was eroded to the bone.  Bone was not visible but it is very close on palpation with sterile instrumentation.  I also placed Surgicel in the area to help prevent bleeding since she is on Eliquis.  I also placed a dresser compressive dressing she was both given both oral and written home-going instructions as well as prescription for Cortisporin Otic to be applied to the toe twice daily after soaking and a prescription for doxycycline.  I will follow-up with her in 1 week.     Telisha Zawadzki T. [place], [place]

## 2018-03-17 ENCOUNTER — Telehealth: Payer: Self-pay | Admitting: Endocrinology

## 2018-03-17 MED ORDER — DOXYCYCLINE HYCLATE 100 MG PO CAPS: 100.0000 mg | ORAL_CAPSULE | Freq: Two times a day (BID) | ORAL | 0 refills | Status: DC

## 2018-03-17 NOTE — Telephone Encounter (Signed)
I spoke with patient & asked her to continue same insulin for now. She stated she would call if there were any drastic changes in blood sugars.

## 2018-03-17 NOTE — Telephone Encounter (Signed)
Pt states she was seen today by Dr. Milinda Pointer and the medication he is prescribing cost too much over $100.00, and she has medication at home and would like a call.

## 2018-03-17 NOTE — Telephone Encounter (Signed)
Overall, these are pretty god, so please continue the same insulin.

## 2018-03-17 NOTE — Telephone Encounter (Signed)
Patient started new insulin  (70-30) on 03/12/18. Blood sugars;Fasting AM -148, 155, 148, 176, 186 (they were always below 130 fasting prior to new medication). Midday-125,61,116,236 Bedtime 191 (once) Please call patient at ph# [phone] to advise Patient states she feels fine but her numbers are very unusual for her (especially the fasting)

## 2018-03-17 NOTE — Telephone Encounter (Signed)
I spoke with pt, she states she was a little muddled yesterday, but the medications are too expensive and she has hydrocortisone cream. I told pt the hydrocortisone cream is a steroid cream not an antibiotic cream. I told pt that the doxycycline had been put in as a tablet, but is less expensive as a capsule, and I would change at her pharmacy. I told pt the drops were expensive, that she could change to neosporin or triple antibiotic ointment instead. Pt states understanding.

## 2018-03-21 ENCOUNTER — Telehealth: Payer: Self-pay

## 2018-03-21 ENCOUNTER — Ambulatory Visit: Payer: Medicare Other | Admitting: Dietician

## 2018-03-21 NOTE — Telephone Encounter (Signed)
This is best for you.  Please increase.  I'll see you next time.

## 2018-03-21 NOTE — Telephone Encounter (Signed)
patient calling today to say that she feels the insulin she was put on in her last visit- novolin 70/30- fasting bs is higher examples: 148 to 185 fasting bs  Afternoon 236- bedtime 233- please advise if there is a better insulin she can take even if it will cost more she is going to try to get some patient assistance for her insulin

## 2018-03-21 NOTE — Telephone Encounter (Signed)
Patient is concerned that she isn't taking the best insulin due to her not being able to afford some other. She does not want to take insulin that isn't as good just because of cost. She stated that she talked to insurance company & they stated that providers can change the tier of the medication to lessen the cost. I explained that I was unaware of this? I told her that I would sed you a message anyway asking. I asked her to do increase & told her that just because it was a [employer] brand didn't mean that it wasn't good insulin for her. Please advise?

## 2018-03-21 NOTE — Telephone Encounter (Signed)
It sounds like this insulin is the right one--you just need to increase Please verify now 40 units qam Then increase to 50 units qam Please call or message [place] next week, to tell us how the blood sugar is doing

## 2018-03-22 ENCOUNTER — Telehealth: Payer: Self-pay

## 2018-03-22 NOTE — Telephone Encounter (Signed)
I called and spoke with patient's husband he stated that he would give her message to return my call.

## 2018-03-22 NOTE — Telephone Encounter (Signed)
Patient returned your call and she states nurse just called her and she would like a call back

## 2018-03-23 NOTE — Telephone Encounter (Signed)
I called patient back & asked her to please follow increase of 70/30.

## 2018-03-27 ENCOUNTER — Ambulatory Visit
Admission: RE | Admit: 2018-03-27 | Discharge: 2018-03-27 | Disposition: A | Discharge status: Home / Self Care | Payer: Medicare Other | Source: Ambulatory Visit | Attending: Internal Medicine | Admitting: Internal Medicine

## 2018-03-27 DIAGNOSIS — Z139 Encounter for screening, unspecified: Secondary | ICD-10-CM

## 2018-03-27 DIAGNOSIS — Z1231 Encounter for screening mammogram for malignant neoplasm of breast: Secondary | ICD-10-CM | POA: Diagnosis not present

## 2018-03-30 ENCOUNTER — Encounter: Payer: Self-pay | Admitting: Podiatry

## 2018-03-30 ENCOUNTER — Ambulatory Visit: Payer: Medicare Other | Admitting: Podiatry

## 2018-03-30 DIAGNOSIS — L600 Ingrowing nail: Secondary | ICD-10-CM

## 2018-03-30 NOTE — Progress Notes (Signed)
Chief for presents today for follow-up of her matrixectomy's tibial border hallux bilaterally.  He states that she is doing pretty well her toes are still sore particularly the right one.  Has not noticed any purulence or malodor.  States that she stopped soaking about 2 days ago which she was soaking with Betadine and warm water.  Is also been applying Neosporin and states that her toes have been juicy.  Objective: Vital signs are stable she is alert and oriented x3 there is no erythema some mild ecchymosis no purulence no malodor there is a small eschar present appears to be healing in very nicely mildly tender on palpation.  Assessment: Slowly healing matrixectomy hallux bilateral.  Plan: I recommend that she go ahead and start soaking in Epsom salts and warm water covering during the daytime and leaving them open at nighttime I recommended that she discontinue the use of the Neosporin.  I will follow-up with her in 2 weeks if necessary or she will call me sooner if needed.

## 2018-04-06 ENCOUNTER — Telehealth: Payer: Self-pay | Admitting: Emergency Medicine

## 2018-04-06 NOTE — Telephone Encounter (Signed)
Called patient to schedule AWV. Pt declined at this time. 

## 2018-04-13 ENCOUNTER — Other Ambulatory Visit: Payer: Self-pay | Admitting: Endocrinology

## 2018-04-13 ENCOUNTER — Telehealth: Payer: Self-pay | Admitting: Endocrinology

## 2018-04-13 NOTE — Telephone Encounter (Signed)
Patient notified

## 2018-04-13 NOTE — Telephone Encounter (Signed)
Patient stated she is going to fast and want to know if she have to take her medication.??  She ask someone to call her today please. [phone]

## 2018-04-13 NOTE — Telephone Encounter (Signed)
D/c insulin on fating days.  If cbg goes over 200, take 10 units that day.  Call if problems.

## 2018-04-17 ENCOUNTER — Ambulatory Visit (HOSPITAL_COMMUNITY)
Admission: RE | Admit: 2018-04-17 | Discharge: 2018-04-17 | Disposition: A | Discharge status: Home / Self Care | Payer: Medicare Other | Source: Ambulatory Visit | Attending: Internal Medicine | Admitting: Internal Medicine

## 2018-04-17 ENCOUNTER — Encounter (HOSPITAL_COMMUNITY): Payer: Self-pay | Admitting: Internal Medicine

## 2018-04-17 ENCOUNTER — Other Ambulatory Visit: Payer: Self-pay

## 2018-04-17 VITALS — BP 146/71 | HR 74 | Wt 176.8 lb

## 2018-04-17 DIAGNOSIS — Z7901 Long term (current) use of anticoagulants: Secondary | ICD-10-CM | POA: Diagnosis not present

## 2018-04-17 DIAGNOSIS — Z888 Allergy status to other drugs, medicaments and biological substances status: Secondary | ICD-10-CM | POA: Diagnosis not present

## 2018-04-17 DIAGNOSIS — J449 Chronic obstructive pulmonary disease, unspecified: Secondary | ICD-10-CM | POA: Diagnosis not present

## 2018-04-17 DIAGNOSIS — Z794 Long term (current) use of insulin: Secondary | ICD-10-CM | POA: Diagnosis not present

## 2018-04-17 DIAGNOSIS — Z91013 Allergy to seafood: Secondary | ICD-10-CM | POA: Diagnosis not present

## 2018-04-17 DIAGNOSIS — I482 Chronic atrial fibrillation, unspecified: Secondary | ICD-10-CM

## 2018-04-17 DIAGNOSIS — R21 Rash and other nonspecific skin eruption: Secondary | ICD-10-CM | POA: Insufficient documentation

## 2018-04-17 DIAGNOSIS — I5032 Chronic diastolic (congestive) heart failure: Secondary | ICD-10-CM | POA: Diagnosis not present

## 2018-04-17 DIAGNOSIS — I4892 Unspecified atrial flutter: Secondary | ICD-10-CM | POA: Insufficient documentation

## 2018-04-17 DIAGNOSIS — Z79899 Other long term (current) drug therapy: Secondary | ICD-10-CM | POA: Insufficient documentation

## 2018-04-17 DIAGNOSIS — I110 Hypertensive heart disease with heart failure: Secondary | ICD-10-CM | POA: Diagnosis not present

## 2018-04-17 DIAGNOSIS — Z881 Allergy status to other antibiotic agents status: Secondary | ICD-10-CM | POA: Insufficient documentation

## 2018-04-17 DIAGNOSIS — I481 Persistent atrial fibrillation: Secondary | ICD-10-CM | POA: Insufficient documentation

## 2018-04-17 DIAGNOSIS — G4733 Obstructive sleep apnea (adult) (pediatric): Secondary | ICD-10-CM | POA: Insufficient documentation

## 2018-04-17 DIAGNOSIS — Z885 Allergy status to narcotic agent status: Secondary | ICD-10-CM | POA: Insufficient documentation

## 2018-04-17 DIAGNOSIS — Z880 Allergy status to penicillin: Secondary | ICD-10-CM | POA: Insufficient documentation

## 2018-04-17 DIAGNOSIS — I2510 Atherosclerotic heart disease of native coronary artery without angina pectoris: Secondary | ICD-10-CM | POA: Diagnosis not present

## 2018-04-17 DIAGNOSIS — M1990 Unspecified osteoarthritis, unspecified site: Secondary | ICD-10-CM | POA: Diagnosis not present

## 2018-04-17 DIAGNOSIS — I5022 Chronic systolic (congestive) heart failure: Secondary | ICD-10-CM | POA: Diagnosis present

## 2018-04-17 LAB — BASIC METABOLIC PANEL
Anion gap: 12 (ref 5–15)
BUN: 21 mg/dL — AB (ref 6–20)
CALCIUM: 9.9 mg/dL (ref 8.9–10.3)
CO2: 25 mmol/L (ref 22–32)
CREATININE: 0.99 mg/dL (ref 0.44–1.00)
Chloride: 101 mmol/L (ref 101–111)
GFR calc Af Amer: 59 mL/min — ABNORMAL LOW (ref >60)
GFR, EST NON AFRICAN AMERICAN: 51 mL/min — AB (ref >60)
Glucose, Bld: 154 mg/dL — ABNORMAL HIGH (ref 65–99)
Potassium: 4 mmol/L (ref 3.5–5.1)
Sodium: 138 mmol/L (ref 135–145)

## 2018-04-17 MED ORDER — FUROSEMIDE 80 MG PO TABS: 80.0000 mg | ORAL_TABLET | Freq: Two times a day (BID) | ORAL | 3 refills | Status: DC

## 2018-04-17 MED ORDER — CARVEDILOL 6.25 MG PO TABS: 6.2500 mg | ORAL_TABLET | Freq: Two times a day (BID) | ORAL | 3 refills | Status: DC

## 2018-04-17 MED ORDER — NITROGLYCERIN 0.4 MG SL SUBL: 0.4000 mg | SUBLINGUAL_TABLET | SUBLINGUAL | 3 refills | Status: DC | PRN

## 2018-04-17 NOTE — Addendum Note (Signed)
Encounter addended by: Darron Doom, RN on: 04/17/2018 1:09 PM  Actions taken: Pharmacy for encounter modified, Order list changed

## 2018-04-17 NOTE — Patient Instructions (Signed)
Labs today (will call for abnormal results, otherwise no news is good news)  Follow up in 4-6 months, we'll call you to schedule appointment.  If you haven't heard from [place] please call us in August/September to schedule your appointment.  [phone], Option 3.

## 2018-04-17 NOTE — Addendum Note (Signed)
Encounter addended by: Darron Doom, RN on: 04/17/2018 1:06 PM  Actions taken: Pharmacy for encounter modified, Order list changed, Diagnosis association updated

## 2018-04-17 NOTE — Progress Notes (Signed)
Patient ID: [name], female   DOB: July 20, 1931, 84 y.o.   MRN: [id number]    Advanced Heart Failure Clinic Note    Referring Physician: Lovena Le Primary Care: Billey Gosling Primary Cardiologist:Taylor  HPI: [name] is a pleasant 84 yo woman with CAD, HTN, atrial fibrillation/A flutter and chronic prior systolic heart failure, (which was likely rate related) and diastolic heart failure. She was referred by Dr. Lovena Le for further evaluation of Pulmonary HTN.   Underwent 2D echo in 3/15 which showed EF 60-65% mild LVH. RV was normal. No significant valvular abnormalities.   Underwent R/L cath 05/07/14 Which showed stable CAD and significant PH  RA 6 RV 71/7 6 PA 63/17 (33)  PCWP 19 mm Hg  LV 174/16 mm Hg  AO 171/62 mean 103 mm Hg  PVR = 2.86 Oxygen saturations:  PA 69%  AO 99%  Cardiac Output/Index (Fick) 4.88/2.7    Left mainstem: Normal.  Left anterior descending (LAD): Moderate calcification proximally. Mild irregularities less than 10%. The first diagonal has 40-50% ostial disease.  Left circumflex (LCx): The LCx gives rise to a large OM1 then terminates in the AV groove. There is 20% disease in the proximal LCx. The first OM stent is patent with diffuse 20% disease.  Right coronary artery (RCA): The RCA arises anteriorly. There is an eccentric slit like stenosis in the proximal vessel to 60-70%. The mid vessel has segmental 70-80% disease.  Left ventriculography: Left ventricular systolic function is normal, LVEF is estimated at 55-65%, there is no significant mitral regurgitation   We saw her for an initial visit in May 2105 for pHTN and DOE. At that time she was quite dyspneic with activity. HR was in 40-50 range on carvedilol 50 bid and digoxin. We stopped her digoxin and cut carvedilol back to 25 bid. PFTs, VQ and CXR were ordered. I also asked her to decrease her H2O intake. Given results of PFTs overnight oximeter was placed and referred to Dr. Gwenette Greet. She has had  overnight oximetry which showed oxygen desaturation as low as 83%, and spent 97 minutes less than 88% during the night.   Admitted 05/21/17-05/27/17 with COPD exacerbation, acute on chronic diastolic CHF. Diuresed 5L with IV Lasix. Discharge weight 173 pounds.  She returns today for HF follow up. Recently lasix increased lasix to 80 bid. She also stopped her supplements, gabapentin and methimazole. She feels much, much better. Edema resolved. Breathing better. No orthopnea or PND. Weight down 8 pounds, has not needed metolazone. No orthopnea or PND. Weight stable 173-175.    Results:  05/21/14: VQ normal 5/15: CXR: 3/16: Echo 3/16 EF 60-65% RV normal. Mild TR No RVSP measured  5/15: PFTs FEV1  1.4 (95%) FVC    1.89 (110%) FEV/FVC 88% FEF 25-75%  66% DLCO 39%  BP rest: 138/58 BP peak: 182/66 Peak VO2: 8.9 (69.1% predicted peak VO2) VE/VCO2 slope: 34.6 OUES: Peak RER: 1.24 Ventilatory Threshold: 5.8 (50% predicted and 65% measured peak VO2) Peak RR 38 Peak Ventilation: 32.7 VE/MVV: 71%  PETCO2 at peak: 36 O2pulse: 8 (100% predicted O2pulse)   Current Outpatient Medications  Medication Sig Dispense Refill  . acetaminophen (TYLENOL) 500 MG tablet Take 1,000 mg by mouth every 6 (six) hours as needed (pain).    Marland Kitchen apixaban (ELIQUIS) 5 MG TABS tablet Take 1 tablet (5 mg total) 2 (two) times daily by mouth. 180 tablet 3  . Blood Glucose Monitoring Suppl (ONETOUCH VERIO IQ SYSTEM) w/Device KIT Use to check sugar twice  daily 1 kit 0  . CALCIUM-MAG-VIT C-VIT D PO Take 1 tablet by mouth daily.    . carvedilol (COREG) 12.5 MG tablet Take 0.5 tablets (6.25 mg total) by mouth 2 (two) times daily with a meal. 45 tablet 3  . carvedilol (COREG) 12.5 MG tablet Take 0.5 tablets (6.25 mg total) by mouth 2 (two) times daily with a meal. 45 tablet 3  . cholecalciferol (VITAMIN D) 1000 units tablet Take 1,000 Units by mouth daily after supper.    . diclofenac sodium (VOLTAREN) 1 % GEL Apply 1  application topically 2 (two) times daily as needed (knee pain).     Marland Kitchen doxycycline (VIBRAMYCIN) 100 MG capsule Take 1 capsule (100 mg total) by mouth 2 (two) times daily. 20 capsule 0  . furosemide (LASIX) 80 MG tablet Take 1 tablet (80 mg total) by mouth 2 (two) times daily. 60 tablet 6  . gabapentin (NEURONTIN) 100 MG capsule Take 3 capsules (300 mg total) by mouth at bedtime. 270 capsule 3  . glucose blood (ONETOUCH VERIO) test strip Use as instructed to check sugar 2 times daily 200 each 5  . insulin NPH-regular Human (NOVOLIN 70/30 RELION) (70-30) 100 UNIT/ML injection Inject 40 Units into the skin daily with breakfast. And syringes 1/day 10 mL 11  . Lancets (ONETOUCH ULTRASOFT) lancets USE TO CHECK BLOOD SUGAR 2  TIMES PER DAY. 200 each 2  . metolazone (ZAROXOLYN) 2.5 MG tablet Take 1 tablet (2.5 mg total) by mouth as directed. (Patient not taking: Reported on 03/08/2018) 5 tablet 3  . Multiple Vitamin (MULTIVITAMIN WITH MINERALS) TABS tablet Take 1 tablet by mouth daily. Centrum Silver    . NEOMYCIN-POLYMYXIN-HYDROCORTISONE (CORTISPORIN) 1 % SOLN OTIC solution Apply 1-2 drops to toe BID after soaking 10 mL 1  . nitroGLYCERIN (NITROSTAT) 0.4 MG SL tablet Place 0.4 mg under the tongue every 5 (five) minutes as needed for chest pain. Reported on 01/28/2016    . potassium chloride SA (K-DUR,KLOR-CON) 20 MEQ tablet Take 2 tablets (40 mEq total) by mouth daily. (Patient taking differently: Take 20 mEq by mouth daily. ) 60 tablet 6  . Tiotropium Bromide Monohydrate (SPIRIVA RESPIMAT) 2.5 MCG/ACT AERS Inhale 2 puffs into the lungs 1 day or 1 dose for 1 dose. 3 Inhaler 3  . TURMERIC PO Take 1,000 mg by mouth. With tart cherry    . umeclidinium bromide (INCRUSE ELLIPTA) 62.5 MCG/INH AEPB Inhale 1 puff into the lungs daily. 1 each 5   No current facility-administered medications for this encounter.     Allergies  Allergen Reactions  . Amlodipine Besylate Other (See Comments)    REACTION: tingling in  lips & gum edema  . Levaquin [Levofloxacin] Shortness Of Breath and Swelling    angioedema  . Lobster [Shellfish Allergy] Other (See Comments)    angioedema  . Penicillins Rash    Has patient had a PCN reaction causing immediate rash, facial/tongue/throat swelling, SOB or lightheadedness with hypotension: Yes Has patient had a PCN reaction causing severe rash involving mucus membranes or skin necrosis: No Has patient had a PCN reaction that required hospitalization: No Has patient had a PCN reaction occurring within the last 10 years: No If all of the above answers are "NO", then may proceed with Cephalosporin use.  . Valsartan Other (See Comments)    REACTION: angioedema  . Codeine Other (See Comments)    Mental status changes  . Lipitor [Atorvastatin] Other (See Comments)    weakness  . Oxycodone Other (See  Comments)    hallucinations  . Statins     Made her too weak  . Tramadol   . Tramadol Hcl Nausea And Vomiting   PHYSICAL EXAM: Vitals:   04/17/18 1214  BP: (!) 146/71  Pulse: 74  SpO2: 97%  Weight: 176 lb 12.8 oz (80.2 kg)   Wt Readings from Last 3 Encounters:  03/08/18 177 lb (80.3 kg)  02/23/18 178 lb (80.7 kg)  02/15/18 185 lb 4 oz (84 kg)   General:  Well appearing. No resp difficulty HEENT: normal Neck: supple. no JVD. Carotids 2+ bilat; no bruits. No lymphadenopathy or thryomegaly appreciated. Cor: PMI nondisplaced. Mildly irregular rate & rhythm. No rubs, gallops or murmurs. Lungs: clear Abdomen: soft, nontender, nondistended. No hepatosplenomegaly. No bruits or masses. Good bowel sounds. Extremities: no cyanosis, clubbing, rash, edema Neuro: alert & orientedx3, cranial nerves grossly intact. moves all 4 extremities w/o difficulty. Affect pleasant   ASSESSMENT & PLAN: 1) Chronic diastolic CHF: Echo 01/946 EF 60-65%, LA severely dilated, Echo 10/2017: EF 65-70%, grade 3 DD - Much improved. NYHA II.  - Volume status better. Continue lasix 80 bid. -  Continue KCL to 40 bid - labs today  2) Persistent atrial fib/flutter    This patients CHA2DS2-VASc Score and unadjusted Ischemic Stroke Rate (% per year) is equal to 7.2 % stroke rate/year from a score of 5 Above score calculated as 2 points each if present [Age > 75, or Stroke/TIA/TE] - Rate controlled on coreg 6.25 mg BID - Continue Eliquis. No bleeding    3) OSA/COPD - Follows with Dr. Halford Chessman.  - Has stopped CPAP. I have encouraged her to restart. If mask uncomfortable will need to be fit for anew one.   4) CAD - Last cath 2015 with moderate CAD.  - No s/s ischemia  5) Petechial rash - Has seen Dermatology and told they were broken blood vessels  6). Arthritis  Glori Bickers, MD  12:57 PM

## 2018-04-26 ENCOUNTER — Telehealth: Payer: Self-pay

## 2018-04-26 ENCOUNTER — Other Ambulatory Visit: Payer: Self-pay

## 2018-04-26 MED ORDER — ONETOUCH VERIO IQ SYSTEM W/DEVICE KIT: PACK | 0 refills | Status: DC

## 2018-04-26 NOTE — Telephone Encounter (Signed)
Patient had called [facility] & requested a new meter. I called patient to verify pharmacy & I have sent her a new meter.

## 2018-05-08 ENCOUNTER — Ambulatory Visit (INDEPENDENT_AMBULATORY_CARE_PROVIDER_SITE_OTHER): Payer: Medicare Other | Admitting: Endocrinology

## 2018-05-08 ENCOUNTER — Encounter: Payer: Self-pay | Admitting: Endocrinology

## 2018-05-08 ENCOUNTER — Other Ambulatory Visit: Payer: Self-pay

## 2018-05-08 VITALS — BP 152/62 | HR 92 | Wt 177.6 lb

## 2018-05-08 DIAGNOSIS — E1122 Type 2 diabetes mellitus with diabetic chronic kidney disease: Secondary | ICD-10-CM | POA: Diagnosis not present

## 2018-05-08 DIAGNOSIS — Z794 Long term (current) use of insulin: Secondary | ICD-10-CM

## 2018-05-08 DIAGNOSIS — E0590 Thyrotoxicosis, unspecified without thyrotoxic crisis or storm: Secondary | ICD-10-CM

## 2018-05-08 DIAGNOSIS — N183 Chronic kidney disease, stage 3 unspecified: Secondary | ICD-10-CM

## 2018-05-08 LAB — T4, FREE: Free T4: 0.74 ng/dL (ref 0.60–1.60)

## 2018-05-08 LAB — POCT GLYCOSYLATED HEMOGLOBIN (HGB A1C): HEMOGLOBIN A1C: 7.3

## 2018-05-08 LAB — TSH: TSH: 1.5 u[IU]/mL (ref 0.35–4.50)

## 2018-05-08 MED ORDER — INSULIN NPH ISOPHANE & REGULAR (70-30) 100 UNIT/ML ~~LOC~~ SUSP: 35.0000 [IU] | Freq: Every day | SUBCUTANEOUS | 11 refills | Status: DC

## 2018-05-08 MED ORDER — ONETOUCH ULTRASOFT LANCETS MISC: 2 refills | Status: DC

## 2018-05-08 NOTE — Progress Notes (Signed)
Subjective:    Patient ID: [name], female    DOB: 1931-11-03, 84 y.o.   MRN: [id number]  HPI Pt returns for f/u of diabetes mellitus:  DM type: Insulin-requiring type 2.   Dx'ed: 6754 Complications: CAD, polyneuropathy, and renal insufficiency.  Therapy: insulin since 2007.  GDM: never.  DKA: never. Severe hypoglycemia: never.  Pancreatitis: never.  Other: she changed to qd insulin, after poor results with multiple daily injections; she was changed from lantus to QAM NPH, due to am hypoglycemia and PM hyperglycemia.   Interval history: she brings a record of her cbg's which I have reviewed today.  It varies from 45-170.  It is lowest before lunch (which is 5 hrs after breakfast).  She takes only 40 units qam.   Pt also has hyperthyroidism (dx'ed 2012; [place] then showed multinodular goiter; tapazole was chosen as rx, due to low uptake on nuc med scan; tapazole was stopped, due to rashplan will be for RAI when this recurs off rx).  Past Medical History:  Diagnosis Date  . Anemia    iron defficiency  . Anginal pain ([place])   . Arthritis    "knees, feet, hands; joints" (05/27/2015)  . Asthma   . Atrial fibrillation or flutter    s/p RFCA 7/08;   s/p DCCV in past;   previously on amiodarone;  amio stopped due to lung toxicity  . CAD (coronary artery disease)    s/p NSTEMI tx with BMS to OM1 3/08;  cath 3/08: pOM 99% tx with PCI, pLAD 20%, ? mod stenosis at the AM  . CAP (community acquired pneumonia) 05/24/2015  . Chronic diastolic heart failure (HCC)    echo 11/11:  EF 55-60%, severe LVH, mod LAE, mild MR, mildly increased PASP  . COPD (chronic obstructive pulmonary disease) ([place])   . Degenerative joint disease   . Dystrophy, corneal stromal   . Heart murmur   . HLD (hyperlipidemia)   . HTN (hypertension)    essential nos  . Hypopotassemia    PMH of  . Muscle pain   . Myocardial infarction ([place]) 2008  . OSA on CPAP   . Osteoporosis   . Pneumonia 05/27/2015  . Protein  calorie malnutrition ([place])   . Rash and nonspecific skin eruption    both arms,awaiting bio   dr Delman Cheadle  . Seasonal allergies   . Shortness of breath dyspnea   . Type II diabetes mellitus ([place])    Dr Loanne Drilling    Past Surgical History:  Procedure Laterality Date  . A FLUTTER ABLATION     Dr Lovena Le  . CATARACT EXTRACTION W/ INTRAOCULAR LENS  IMPLANT, BILATERAL Bilateral   . CHOLECYSTECTOMY    . COLONOSCOPY  6/07   2 polyps Dr. Kinnie Feil in [place]  . colonoscopy with polypectomy      X 2;  GI  . CORNEAL TRANSPLANT Bilateral   . CORONARY ANGIOPLASTY WITH STENT PLACEMENT  02/2007   BMS w/Dr Lovena Le  . EYE SURGERY    . gravid 2 para 2    . KNEE CARTILAGE SURGERY Right   . LEFT AND RIGHT HEART CATHETERIZATION WITH CORONARY ANGIOGRAM N/A 05/07/2014   Procedure: LEFT AND RIGHT HEART CATHETERIZATION WITH CORONARY ANGIOGRAM;  Surgeon: Peter M Martinique, MD;  Location: [facility] CATH LAB;  Service: Cardiovascular;  Laterality: N/A;  . TOTAL KNEE ARTHROPLASTY Right 06/28/2016   Procedure: TOTAL KNEE ARTHROPLASTY;  Surgeon: Frederik Pear, MD;  Location: [facility];  Service: Orthopedics;  Laterality: Right;  Social History   Socioeconomic History  . Marital status: Widowed    Spouse name: Not on file  . Number of children: Not on file  . Years of education: Not on file  . Highest education level: Not on file  Occupational History  . Occupation: Pharmacist, hospital  Social Needs  . Financial resource strain: Not on file  . Food insecurity:    Worry: Not on file    Inability: Not on file  . Transportation needs:    Medical: Not on file    Non-medical: Not on file  Tobacco Use  . Smoking status: Former Smoker    Packs/day: 0.25    Years: 18.00    Pack years: 4.50    Types: Cigarettes    Last attempt to quit: 12/27/1968    Years since quitting: 49.3  . Smokeless tobacco: Never Used  . Tobacco comment: smoked [place], up to 1 pp week  Substance and Sexual Activity  . Alcohol use: Yes    Comment: occ  .  Drug use: No  . Sexual activity: Never  Lifestyle  . Physical activity:    Days per week: Not on file    Minutes per session: Not on file  . Stress: Not on file  Relationships  . Social connections:    Talks on phone: Not on file    Gets together: Not on file    Attends religious service: Not on file    Active member of club or organization: Not on file    Attends meetings of clubs or organizations: Not on file    Relationship status: Not on file  . Intimate partner violence:    Fear of current or ex partner: Not on file    Emotionally abused: Not on file    Physically abused: Not on file    Forced sexual activity: Not on file  Other Topics Concern  . Not on file  Social History Narrative   Teacher. Widowed. Rarely drinks cafeine.     Current Outpatient Medications on File Prior to Visit  Medication Sig Dispense Refill  . acetaminophen (TYLENOL) 500 MG tablet Take 1,000 mg by mouth every 6 (six) hours as needed (pain).    Marland Kitchen apixaban (ELIQUIS) 5 MG TABS tablet Take 1 tablet (5 mg total) 2 (two) times daily by mouth. 180 tablet 3  . Blood Glucose Monitoring Suppl (ONETOUCH VERIO IQ SYSTEM) w/Device KIT Use to check sugar twice daily 1 kit 0  . CALCIUM-MAG-VIT C-VIT D PO Take 1 tablet by mouth daily.    . carvedilol (COREG) 6.25 MG tablet Take 1 tablet (6.25 mg total) by mouth 2 (two) times daily with a meal. 180 tablet 3  . cholecalciferol (VITAMIN D) 1000 units tablet Take 1,000 Units by mouth daily after supper.    . furosemide (LASIX) 80 MG tablet Take 1 tablet (80 mg total) by mouth 2 (two) times daily. 180 tablet 3  . glucose blood (ONETOUCH VERIO) test strip Use as instructed to check sugar 2 times daily 200 each 5  . Multiple Vitamin (MULTIVITAMIN WITH MINERALS) TABS tablet Take 1 tablet by mouth daily. Centrum Silver    . nitroGLYCERIN (NITROSTAT) 0.4 MG SL tablet Place 1 tablet (0.4 mg total) under the tongue every 5 (five) minutes as needed for chest pain. Reported on  01/28/2016 30 tablet 3  . potassium chloride SA (K-DUR,KLOR-CON) 20 MEQ tablet Take 2 tablets (40 mEq total) by mouth daily. (Patient taking differently: Take 20 mEq by  mouth daily. ) 60 tablet 6  . Tiotropium Bromide Monohydrate (SPIRIVA RESPIMAT) 2.5 MCG/ACT AERS Inhale 2 puffs into the lungs 1 day or 1 dose for 1 dose. 3 Inhaler 3  . umeclidinium bromide (INCRUSE ELLIPTA) 62.5 MCG/INH AEPB Inhale 1 puff into the lungs daily. (Patient not taking: Reported on 05/08/2018) 1 each 5   No current facility-administered medications on file prior to visit.     Allergies  Allergen Reactions  . Amlodipine Besylate Other (See Comments)    REACTION: tingling in lips & gum edema  . Levaquin [Levofloxacin] Shortness Of Breath and Swelling    angioedema  . Lobster [Shellfish Allergy] Other (See Comments)    angioedema  . Penicillins Rash    Has patient had a PCN reaction causing immediate rash, facial/tongue/throat swelling, SOB or lightheadedness with hypotension: Yes Has patient had a PCN reaction causing severe rash involving mucus membranes or skin necrosis: No Has patient had a PCN reaction that required hospitalization: No Has patient had a PCN reaction occurring within the last 10 years: No If all of the above answers are "NO", then may proceed with Cephalosporin use.  . Valsartan Other (See Comments)    REACTION: angioedema  . Codeine Other (See Comments)    Mental status changes  . Lipitor [Atorvastatin] Other (See Comments)    weakness  . Oxycodone Other (See Comments)    hallucinations  . Statins     Made her too weak  . Tramadol   . Tramadol Hcl Nausea And Vomiting    Family History  Problem Relation Age of Onset  . Diabetes Mother   . Hypertension Mother   . Transient ischemic attack Mother   . Arthritis Mother   . Heart attack Father 66  . Arthritis Father   . Hypertension Father   . Breast cancer Maternal Aunt   . Arthritis Maternal Aunt     BP (!) 152/62   Pulse 92    Wt 177 lb 9.6 oz (80.6 kg)   SpO2 95%   BMI 32.48 kg/m    Review of Systems She denies hypoglycemia.      Objective:   Physical Exam VITAL SIGNS:  See vs page GENERAL: no distress Pulses: foot pulses are intact bilaterally.   MSK: no deformity of the feet or ankles.  CV: trace bilat edema of the legs Skin:  no ulcer on the feet or ankles.  normal temp on the feet and ankles.  There is a mild petechial rash on the legs and feet Neuro: sensation is intact to touch on the feet and ankles.     Lab Results  Component Value Date   HGBA1C 7.3 05/08/2018       Assessment & Plan:  Insulin-requiring type 2 DM: this is the best control this pt should aim for, given this regimen, which does match insulin to her changing needs throughout the day Hypoglycemia: she should decrease the insulin, despite a1c over 7% Hyperthyroidism: recheck today. HTN: is noted today  Patient Instructions  Your blood pressure is high today.  Please see your primary care provider soon, to have it rechecked check your blood sugar twice a day.  vary the time of day when you check, between before the 3 meals, and at bedtime.  also check if you have symptoms of your blood sugar being too high or too low.  please keep a record of the readings and bring it to your next appointment here.  please call us sooner if  your blood sugar goes below 70, or if you have a lot of readings over 200.      Please reduce the "70/30" insulin to 35 units with breakfast.   On this type of insulin schedule, you should eat meals on a regular schedule.  In particular, lunch should be no more than 3 hrs after breakfast.  If a meal is missed or significantly delayed, your blood sugar could go low.   Thyroid blood tests are requested for you today.  We'll let you know about the results.   Please come back for a follow-up appointment in 3 months.

## 2018-05-08 NOTE — Patient Instructions (Addendum)
Your blood pressure is high today.  Please see your primary care provider soon, to have it rechecked check your blood sugar twice a day.  vary the time of day when you check, between before the 3 meals, and at bedtime.  also check if you have symptoms of your blood sugar being too high or too low.  please keep a record of the readings and bring it to your next appointment here.  please call us sooner if your blood sugar goes below 70, or if you have a lot of readings over 200.      Please reduce the "70/30" insulin to 35 units with breakfast.   On this type of insulin schedule, you should eat meals on a regular schedule.  In particular, lunch should be no more than 3 hrs after breakfast.  If a meal is missed or significantly delayed, your blood sugar could go low.   Thyroid blood tests are requested for you today.  We'll let you know about the results.   Please come back for a follow-up appointment in 3 months.

## 2018-05-08 NOTE — Progress Notes (Signed)
7.3

## 2018-05-10 ENCOUNTER — Other Ambulatory Visit (HOSPITAL_COMMUNITY): Payer: Self-pay | Admitting: *Deleted

## 2018-05-10 MED ORDER — POTASSIUM CHLORIDE CRYS ER 20 MEQ PO TBCR: 40.0000 meq | EXTENDED_RELEASE_TABLET | Freq: Every day | ORAL | 3 refills | Status: DC

## 2018-06-05 ENCOUNTER — Telehealth: Payer: Self-pay | Admitting: Endocrinology

## 2018-06-05 ENCOUNTER — Other Ambulatory Visit: Payer: Self-pay

## 2018-06-05 DIAGNOSIS — Z947 Corneal transplant status: Secondary | ICD-10-CM | POA: Diagnosis not present

## 2018-06-05 DIAGNOSIS — Z961 Presence of intraocular lens: Secondary | ICD-10-CM | POA: Diagnosis not present

## 2018-06-05 DIAGNOSIS — Z794 Long term (current) use of insulin: Secondary | ICD-10-CM | POA: Diagnosis not present

## 2018-06-05 DIAGNOSIS — E119 Type 2 diabetes mellitus without complications: Secondary | ICD-10-CM | POA: Diagnosis not present

## 2018-06-05 LAB — HM DIABETES EYE EXAM

## 2018-06-05 MED ORDER — ONETOUCH ULTRASOFT LANCETS MISC: 2 refills | Status: DC

## 2018-06-05 MED ORDER — GLUCOSE BLOOD VI STRP: ORAL_STRIP | 5 refills | Status: DC

## 2018-06-05 MED ORDER — ONETOUCH VERIO FLEX SYSTEM W/DEVICE KIT: 1.0000 | PACK | 0 refills | Status: DC | PRN

## 2018-06-05 NOTE — Telephone Encounter (Signed)
I have sent to requested pharmacy.  

## 2018-06-05 NOTE — Telephone Encounter (Signed)
Patient request a refill for insul;in supplies One Touch Verio Flex, Lancets and test strips  Send to [facility], [place]

## 2018-06-13 ENCOUNTER — Other Ambulatory Visit (HOSPITAL_COMMUNITY): Payer: Self-pay | Admitting: *Deleted

## 2018-06-13 MED ORDER — TIOTROPIUM BROMIDE MONOHYDRATE 2.5 MCG/ACT IN AERS: 2.0000 | INHALATION_SPRAY | RESPIRATORY_TRACT | 0 refills | Status: DC

## 2018-06-20 ENCOUNTER — Other Ambulatory Visit (HOSPITAL_COMMUNITY): Payer: Self-pay | Admitting: Internal Medicine

## 2018-06-20 ENCOUNTER — Encounter: Payer: Self-pay | Admitting: Internal Medicine

## 2018-07-17 ENCOUNTER — Other Ambulatory Visit (HOSPITAL_COMMUNITY): Payer: Self-pay | Admitting: *Deleted

## 2018-07-19 ENCOUNTER — Ambulatory Visit (INDEPENDENT_AMBULATORY_CARE_PROVIDER_SITE_OTHER): Payer: Medicare Other | Admitting: Endocrinology

## 2018-07-19 ENCOUNTER — Encounter: Payer: Self-pay | Admitting: Endocrinology

## 2018-07-19 VITALS — BP 138/68 | HR 85 | Ht 62.0 in | Wt 164.2 lb

## 2018-07-19 DIAGNOSIS — E1122 Type 2 diabetes mellitus with diabetic chronic kidney disease: Secondary | ICD-10-CM

## 2018-07-19 DIAGNOSIS — Z794 Long term (current) use of insulin: Secondary | ICD-10-CM | POA: Diagnosis not present

## 2018-07-19 DIAGNOSIS — E042 Nontoxic multinodular goiter: Secondary | ICD-10-CM

## 2018-07-19 DIAGNOSIS — N183 Chronic kidney disease, stage 3 (moderate): Secondary | ICD-10-CM | POA: Diagnosis not present

## 2018-07-19 LAB — TSH: TSH: 1.65 u[IU]/mL (ref 0.35–4.50)

## 2018-07-19 LAB — POCT GLYCOSYLATED HEMOGLOBIN (HGB A1C): Hemoglobin A1C: 6.4 % — AB (ref 4.0–5.6)

## 2018-07-19 LAB — T4, FREE: Free T4: 1.03 ng/dL (ref 0.60–1.60)

## 2018-07-19 MED ORDER — INSULIN NPH ISOPHANE & REGULAR (70-30) 100 UNIT/ML ~~LOC~~ SUSP: 25.0000 [IU] | Freq: Every day | SUBCUTANEOUS | 11 refills | Status: DC

## 2018-07-19 NOTE — Patient Instructions (Addendum)
check your blood sugar twice a day.  vary the time of day when you check, between before the 3 meals, and at bedtime.  also check if you have symptoms of your blood sugar being too high or too low.  please keep a record of the readings and bring it to your next appointment here.  please call us sooner if your blood sugar goes below 70, or if you have a lot of readings over 200.      Please reduce the insulin to 25 units with breakfast.   On this type of insulin schedule, you should eat meals on a regular schedule.  In particular, lunch should be no more than 3 hrs after breakfast.  If a meal is missed or significantly delayed, your blood sugar could go low.   Thyroid blood tests are requested for you today.  We'll let you know about the results.   Please come back for a follow-up appointment in 2 months.

## 2018-07-19 NOTE — Progress Notes (Signed)
Subjective:    Patient ID: [name], female    DOB: May 30, 1931, 84 y.o.   MRN: [id number]  HPI Pt returns for f/u of diabetes mellitus:  DM type: Insulin-requiring type 2.   Dx'ed: 1914 Complications: CAD, polyneuropathy, and renal insufficiency.  Therapy: insulin since 2007.  GDM: never.  DKA: never. Severe hypoglycemia: never.  Pancreatitis: never.  Other: she changed to qd insulin, after poor results with multiple daily injections; she was changed from lantus to QAM [place], due to am hypoglycemia and PM hyperglycemia.   Interval history: she brings a record of her cbg's which I have reviewed today.  It varies from 58-148.    Pt also has hyperthyroidism (dx'ed 2012; [place] then showed multinodular goiter; tapazole was chosen as rx, due to low uptake on nuc med scan; tapazole was stopped, due to rash; plan will be for RAI when this recurs off rx).  Past Medical History:  Diagnosis Date  . Anemia    iron defficiency  . Anginal pain ([place])   . Arthritis    "knees, feet, hands; joints" (05/27/2015)  . Asthma   . Atrial fibrillation or flutter    s/p RFCA 7/08;   s/p DCCV in past;   previously on amiodarone;  amio stopped due to lung toxicity  . CAD (coronary artery disease)    s/p NSTEMI tx with BMS to OM1 3/08;  cath 3/08: pOM 99% tx with PCI, pLAD 20%, ? mod stenosis at the AM  . CAP (community acquired pneumonia) 05/24/2015  . Chronic diastolic heart failure (HCC)    echo 11/11:  EF 55-60%, severe LVH, mod LAE, mild MR, mildly increased PASP  . COPD (chronic obstructive pulmonary disease) ([place])   . Degenerative joint disease   . Dystrophy, corneal stromal   . Heart murmur   . HLD (hyperlipidemia)   . HTN (hypertension)    essential nos  . Hypopotassemia    PMH of  . Muscle pain   . Myocardial infarction ([place]) 2008  . OSA on CPAP   . Osteoporosis   . Pneumonia 05/27/2015  . Protein calorie malnutrition ([place])   . Rash and nonspecific skin eruption    both arms,awaiting  bio   dr Delman Cheadle  . Seasonal allergies   . Shortness of breath dyspnea   . Type II diabetes mellitus ([place])    Dr Loanne Drilling    Past Surgical History:  Procedure Laterality Date  . A FLUTTER ABLATION     Dr Lovena Le  . CATARACT EXTRACTION W/ INTRAOCULAR LENS  IMPLANT, BILATERAL Bilateral   . CHOLECYSTECTOMY    . COLONOSCOPY  6/07   2 polyps Dr. Kinnie Feil in [place]  . colonoscopy with polypectomy      X 2; [facility]  . CORNEAL TRANSPLANT Bilateral   . CORONARY ANGIOPLASTY WITH STENT PLACEMENT  02/2007   BMS w/Dr Lovena Le  . EYE SURGERY    . gravid 2 para 2    . KNEE CARTILAGE SURGERY Right   . LEFT AND RIGHT HEART CATHETERIZATION WITH CORONARY ANGIOGRAM N/A 05/07/2014   Procedure: LEFT AND RIGHT HEART CATHETERIZATION WITH CORONARY ANGIOGRAM;  Surgeon: Peter M Martinique, MD;  Location[facility] C[facility] LAB;  Service: Cardiovascular;  Laterality: N/A;  . TOTAL KNEE ARTHROPLASTY Right 06/28/2016   Procedure: TOTAL KNEE ARTHROPLASTY;  Surgeon: Frederik Pear, MD;  Location: [facility];  Service: Orthopedics;  Laterality: Right;    Social History   Socioeconomic History  . Marital status: Widowed  Spouse name: Not on file  . Number of children: Not on file  . Years of education: Not on file  . Highest education level: Not on file  Occupational History  . Occupation: Pharmacist, hospital  Social Needs  . Financial resource strain: Not on file  . Food insecurity:    Worry: Not on file    Inability: Not on file  . Transportation needs:    Medical: Not on file    Non-medical: Not on file  Tobacco Use  . Smoking status: Former Smoker    Packs/day: 0.25    Years: 18.00    Pack years: 4.50    Types: Cigarettes    Last attempt to quit: 12/27/1968    Years since quitting: 49.5  . Smokeless tobacco: Never Used  . Tobacco comment: smoked [place], up to 1 pp week  Substance and Sexual Activity  . Alcohol use: Yes    Comment: occ  . Drug use: No  . Sexual activity: Never  Lifestyle  . Physical activity:    Days per week:  Not on file    Minutes per session: Not on file  . Stress: Not on file  Relationships  . Social connections:    Talks on phone: Not on file    Gets together: Not on file    Attends religious service: Not on file    Active member of club or organization: Not on file    Attends meetings of clubs or organizations: Not on file    Relationship status: Not on file  . Intimate partner violence:    Fear of current or ex partner: Not on file    Emotionally abused: Not on file    Physically abused: Not on file    Forced sexual activity: Not on file  Other Topics Concern  . Not on file  Social History Narrative   Teacher. Widowed. Rarely drinks cafeine.     Current Outpatient Medications on File Prior to Visit  Medication Sig Dispense Refill  . acetaminophen (TYLENOL) 500 MG tablet Take 1,000 mg by mouth every 6 (six) hours as needed (pain).    . Blood Glucose Monitoring Suppl (ONETOUCH VERIO IQ SYSTEM) w/Device KIT Use to check sugar twice daily 1 kit 0  . CALCIUM-MAG-VIT C-VIT D PO Take 1 tablet by mouth daily.    . carvedilol (COREG) 6.25 MG tablet Take 1 tablet (6.25 mg total) by mouth 2 (two) times daily with a meal. 180 tablet 3  . cholecalciferol (VITAMIN D) 1000 units tablet Take 1,000 Units by mouth daily after supper.    . furosemide (LASIX) 80 MG tablet Take 1 tablet (80 mg total) by mouth 2 (two) times daily. 180 tablet 3  . glucose blood (ONETOUCH VERIO) test strip Use as instructed to check sugar 2 times daily 200 each 5  . Lancets (ONETOUCH ULTRASOFT) lancets USE TO CHECK BLOOD SUGAR 2  TIMES PER DAY. 200 each 2  . Multiple Vitamin (MULTIVITAMIN WITH MINERALS) TABS tablet Take 1 tablet by mouth daily. Centrum Silver    . nitroGLYCERIN (NITROSTAT) 0.4 MG SL tablet Place 1 tablet (0.4 mg total) under the tongue every 5 (five) minutes as needed for chest pain. Reported on 01/28/2016 30 tablet 3  . potassium chloride SA (K-DUR,KLOR-CON) 20 MEQ tablet Take 2 tablets (40 mEq total) by  mouth daily. 180 tablet 3  . umeclidinium bromide (INCRUSE ELLIPTA) 62.5 MCG/INH AEPB Inhale 1 puff into the lungs daily. (Patient not taking: Reported on 05/08/2018)  1 each 5   No current facility-administered medications on file prior to visit.     Allergies  Allergen Reactions  . Amlodipine Besylate Other (See Comments)    REACTION: tingling in lips & gum edema  . Levaquin [Levofloxacin] Shortness Of Breath and Swelling    angioedema  . Lobster [Shellfish Allergy] Other (See Comments)    angioedema  . Penicillins Rash    Has patient had a PCN reaction causing immediate rash, facial/tongue/throat swelling, SOB or lightheadedness with hypotension: Yes Has patient had a PCN reaction causing severe rash involving mucus membranes or skin necrosis: No Has patient had a PCN reaction that required hospitalization: No Has patient had a PCN reaction occurring within the last 10 years: No If all of the above answers are "NO", then may proceed with Cephalosporin use.  . Valsartan Other (See Comments)    REACTION: angioedema  . Codeine Other (See Comments)    Mental status changes  . Lipitor [Atorvastatin] Other (See Comments)    weakness  . Oxycodone Other (See Comments)    hallucinations  . Statins     Made her too weak  . Tramadol   . Tramadol Hcl Nausea And Vomiting    Family History  Problem Relation Age of Onset  . Diabetes Mother   . Hypertension Mother   . Transient ischemic attack Mother   . Arthritis Mother   . Heart attack Father 16  . Arthritis Father   . Hypertension Father   . Breast cancer Maternal Aunt   . Arthritis Maternal Aunt     BP 138/68 (BP Location: Left Arm, Patient Position: Sitting, Cuff Size: Normal)   Pulse 85   Ht 5' 2"  (1.575 m)   Wt 164 lb 3.2 oz (74.5 kg)   SpO2 91%   BMI 30.03 kg/m    Review of Systems She has lost weight, due to her efforts    Objective:   Physical Exam VITAL SIGNS:  See vs page GENERAL: no distress Pulses:  dorsalis pedis intact bilat.   MSK: no deformity of the feet CV: no leg edema Skin:  no ulcer on the feet.  normal color and temp on the feet. Neuro: sensation is intact to touch on the feet Ext: there are bandaids on both great toenails (recent resection of ingrown nails)   Lab Results  Component Value Date   HGBA1C 6.4 (A) 07/19/2018   Lab Results  Component Value Date   CREATININE 0.99 04/17/2018   BUN 21 (H) 04/17/2018   NA 138 04/17/2018   K 4.0 04/17/2018   CL 101 04/17/2018   CO2 25 04/17/2018       Assessment & Plan:  .Insulin-requiring type 2 DM: overcontrolled Hypoglycemia: she should reduce insulin Hyperthyroidism: recheck today  Patient Instructions  check your blood sugar twice a day.  vary the time of day when you check, between before the 3 meals, and at bedtime.  also check if you have symptoms of your blood sugar being too high or too low.  please keep a record of the readings and bring it to your next appointment here.  please call us sooner if your blood sugar goes below 70, or if you have a lot of readings over 200.      Please reduce the insulin to 25 units with breakfast.   On this type of insulin schedule, you should eat meals on a regular schedule.  In particular, lunch should be no more than 3 hrs after  breakfast.  If a meal is missed or significantly delayed, your blood sugar could go low.   Thyroid blood tests are requested for you today.  We'll let you know about the results.   Please come back for a follow-up appointment in 2 months.

## 2018-07-20 ENCOUNTER — Telehealth (HOSPITAL_COMMUNITY): Payer: Self-pay

## 2018-07-20 ENCOUNTER — Other Ambulatory Visit (HOSPITAL_COMMUNITY): Payer: Self-pay

## 2018-07-20 MED ORDER — APIXABAN 5 MG PO TABS: 5.0000 mg | ORAL_TABLET | Freq: Two times a day (BID) | ORAL | 2 refills | Status: DC

## 2018-07-20 MED ORDER — TIOTROPIUM BROMIDE MONOHYDRATE 2.5 MCG/ACT IN AERS: 2.0000 | INHALATION_SPRAY | RESPIRATORY_TRACT | 0 refills | Status: DC

## 2018-07-20 NOTE — Telephone Encounter (Signed)
Medication Samples have been provided to the patient.  Drug name: Eliquis       Strength: 5 mg        Qty: 1  LOT: [id number]  Exp.Date: 08/21  Dosing instructions: Take 1 tablet (5 mg total) by mouth 2 (two) times daily  The patient has been instructed regarding the correct time, dose, and frequency of taking this medication, including desired effects and most common side effects.   [name] 10:47 AM 07/20/2018

## 2018-07-25 ENCOUNTER — Other Ambulatory Visit: Payer: Self-pay | Admitting: Emergency Medicine

## 2018-07-25 ENCOUNTER — Telehealth: Payer: Self-pay | Admitting: Internal Medicine

## 2018-07-25 MED ORDER — TIOTROPIUM BROMIDE MONOHYDRATE 2.5 MCG/ACT IN AERS: 2.0000 | INHALATION_SPRAY | RESPIRATORY_TRACT | 1 refills | Status: DC

## 2018-07-25 NOTE — Telephone Encounter (Signed)
Copied from [place] [id number]. Topic: Quick Communication - Rx Refill/Question >> Jul 25, 2018 10:23 AM Burchel, Abbi R wrote: Medication: Tiotropium Bromide Monohydrate (SPIRIVA RESPIMAT) 2.5 MCG/ACT AERS   Pt states to pharmacist that she would like to use the Spiriva Handihaler instead of the Respimat inhaler.  She says its difficult to use bc of her arthritis.    Pharmacy is also requesting clarification of Directions for Respimat inhaler.    Shea Stakes- [facility]  [phone]  Ref # : [id number]

## 2018-07-26 MED ORDER — TIOTROPIUM BROMIDE MONOHYDRATE 2.5 MCG/ACT IN AERS: 2.0000 | INHALATION_SPRAY | Freq: Every day | RESPIRATORY_TRACT | 3 refills | Status: DC

## 2018-07-26 NOTE — Telephone Encounter (Signed)
rx sent to [facility]

## 2018-07-27 NOTE — Telephone Encounter (Signed)
[facility] called and they are needing the Handi haler of this medication; contact [phone] Ref# [id number]

## 2018-07-28 NOTE — Progress Notes (Addendum)
Subjective:   [name] is a 84 y.o. female who presents for Medicare Annual (Subsequent) preventive examination.  Review of Systems:  No ROS.  Medicare Wellness Visit. Additional risk factors are reflected in the social history.    Sleep patterns: gets up 1 times nightly to void and sleeps 7-8 hours nightly.    Home Safety/Smoke Alarms: Feels safe in home. Smoke alarms in place.  Living environment; residence and Firearm Safety: 2-story house, can live on one level, equipment: Radio producer, Type: Narrow [employer], Type: Tub Surveyor, quantity, no firearms, firearms stored safely. Lives with son, no needs for DME, good support system Seat Belt Safety/Bike Helmet: Wears seat belt.      Objective:     Vitals: BP 124/76   Pulse 70   Temp 97.6 F (36.4 C)   Resp 18   Ht 5' 2" (1.575 m)   Wt 173 lb (78.5 kg)   SpO2 96%   BMI 31.64 kg/m   Body mass index is 31.64 kg/m.  Advanced Directives 07/31/2018 12/12/2017 06/06/2017 05/22/2017 05/21/2017 03/23/2017 02/07/2017  Does Patient Have a Medical Advance Directive? Yes Yes Yes Yes No Yes No;Yes  Type of Paramedic of [place];Living will - [place];Living will [place];Living will - [place];Living will [place];Living will  Does patient want to make changes to medical advance directive? - - Yes (MAU/Ambulatory/Procedural Areas - Information given) No - Patient declined - - -  Copy of [place] in Chart? No - copy requested - No - copy requested No - copy requested - No - copy requested No - copy requested  Would patient like information on creating a medical advance directive? - - - - - - -    Tobacco Social History   Tobacco Use  Smoking Status Former Smoker  . Packs/day: 0.25  . Years: 18.00  . Pack years: 4.50  . Types: Cigarettes  . Last attempt to quit: 12/27/1968  . Years since quitting:  49.6  Smokeless Tobacco Never Used  Tobacco Comment   smoked [place], up to 1 pp week     Counseling given: Not Answered Comment: smoked [place], up to 1 pp week  Past Medical History:  Diagnosis Date  . Anemia    iron defficiency  . Anginal pain ([place])   . Arthritis    "knees, feet, hands; joints" (05/27/2015)  . Asthma   . Atrial fibrillation or flutter    s/p RFCA 7/08;   s/p DCCV in past;   previously on amiodarone;  amio stopped due to lung toxicity  . CAD (coronary artery disease)    s/p NSTEMI tx with BMS to OM1 3/08;  cath 3/08: pOM 99% tx with PCI, pLAD 20%, ? mod stenosis at the AM  . CAP (community acquired pneumonia) 05/24/2015  . Chronic diastolic heart failure (HCC)    echo 11/11:  EF 55-60%, severe LVH, mod LAE, mild MR, mildly increased PASP  . COPD (chronic obstructive pulmonary disease) ([place])   . Degenerative joint disease   . Dystrophy, corneal stromal   . Heart murmur   . HLD (hyperlipidemia)   . HTN (hypertension)    essential nos  . Hypopotassemia    PMH of  . Muscle pain   . Myocardial infarction ([place]) 2008  . OSA on CPAP   . Osteoporosis   . Pneumonia 05/27/2015  . Protein calorie malnutrition (  [place])   . Rash and nonspecific skin eruption    both arms,awaiting bio   dr Delman Cheadle  . Seasonal allergies   . Shortness of breath dyspnea   . Type II diabetes mellitus ([place])    Dr Loanne Drilling   Past Surgical History:  Procedure Laterality Date  . A FLUTTER ABLATION     Dr Lovena Le  . CATARACT EXTRACTION W/ INTRAOCULAR LENS  IMPLANT, BILATERAL Bilateral   . CHOLECYSTECTOMY    . COLONOSCOPY  6/07   2 polyps Dr. Kinnie Feil in [place]  . colonoscopy with polypectomy      X 2; [facility]  . CORNEAL TRANSPLANT Bilateral   . CORONARY ANGIOPLASTY WITH STENT PLACEMENT  02/2007   BMS w/Dr Lovena Le  . EYE SURGERY    . gravid 2 para 2    . KNEE CARTILAGE SURGERY Right   . LEFT AND RIGHT HEART CATHETERIZATION WITH CORONARY ANGIOGRAM N/A 05/07/2014   Procedure: LEFT AND  RIGHT HEART CATHETERIZATION WITH CORONARY ANGIOGRAM;  Surgeon: Peter M Martinique, MD;  Location: [facility] CATH LAB;  Service: Cardiovascular;  Laterality: N/A;  . TOTAL KNEE ARTHROPLASTY Right 06/28/2016   Procedure: TOTAL KNEE ARTHROPLASTY;  Surgeon: Frederik Pear, MD;  Location: [facility];  Service: Orthopedics;  Laterality: Right;   Family History  Problem Relation Age of Onset  . Diabetes Mother   . Hypertension Mother   . Transient ischemic attack Mother   . Arthritis Mother   . Heart attack Father 29  . Arthritis Father   . Hypertension Father   . Breast cancer Maternal Aunt   . Arthritis Maternal Aunt    Social History   Socioeconomic History  . Marital status: Widowed    Spouse name: Not on file  . Number of children: Not on file  . Years of education: Not on file  . Highest education level: Not on file  Occupational History  . Occupation: Pharmacist, hospital  Social Needs  . Financial resource strain: Not hard at all  . Food insecurity:    Worry: Never true    Inability: Never true  . Transportation needs:    Medical: No    Non-medical: No  Tobacco Use  . Smoking status: Former Smoker    Packs/day: 0.25    Years: 18.00    Pack years: 4.50    Types: Cigarettes    Last attempt to quit: 12/27/1968    Years since quitting: 49.6  . Smokeless tobacco: Never Used  . Tobacco comment: smoked [place], up to 1 pp week  Substance and Sexual Activity  . Alcohol use: Yes    Comment: occ  . Drug use: No  . Sexual activity: Never  Lifestyle  . Physical activity:    Days per week: Not on file    Minutes per session: Not on file  . Stress: Not on file  Relationships  . Social connections:    Talks on phone: Not on file    Gets together: Not on file    Attends religious service: Not on file    Active member of club or organization: Not on file    Attends meetings of clubs or organizations: Not on file    Relationship status: Not on file  Other Topics Concern  . Not on file  Social History  Narrative   Teacher. Widowed. Rarely drinks cafeine.     Outpatient Encounter Medications as of 07/31/2018  Medication Sig  . acetaminophen (TYLENOL) 500 MG tablet Take 1,000 mg by mouth  every 6 (six) hours as needed (pain).  Marland Kitchen apixaban (ELIQUIS) 5 MG TABS tablet Take 1 tablet (5 mg total) by mouth 2 (two) times daily.  . Blood Glucose Monitoring Suppl (ONETOUCH VERIO IQ SYSTEM) w/Device KIT Use to check sugar twice daily  . CALCIUM-MAG-VIT C-VIT D PO Take 1 tablet by mouth daily.  . carvedilol (COREG) 6.25 MG tablet Take 1 tablet (6.25 mg total) by mouth 2 (two) times daily with a meal.  . cholecalciferol (VITAMIN D) 1000 units tablet Take 1,000 Units by mouth daily after supper.  . furosemide (LASIX) 80 MG tablet Take 1 tablet (80 mg total) by mouth 2 (two) times daily.  Marland Kitchen glucose blood (ONETOUCH VERIO) test strip Use as instructed to check sugar 2 times daily  . insulin NPH-regular Human (NOVOLIN 70/30 RELION) (70-30) 100 UNIT/ML injection Inject 25 Units into the skin daily with breakfast. And syringes 1/day  . Lancets (ONETOUCH ULTRASOFT) lancets USE TO CHECK BLOOD SUGAR 2  TIMES PER DAY.  . Multiple Vitamin (MULTIVITAMIN WITH MINERALS) TABS tablet Take 1 tablet by mouth daily. Centrum Silver  . nitroGLYCERIN (NITROSTAT) 0.4 MG SL tablet Place 1 tablet (0.4 mg total) under the tongue every 5 (five) minutes as needed for chest pain. Reported on 01/28/2016  . potassium chloride SA (K-DUR,KLOR-CON) 20 MEQ tablet Take 2 tablets (40 mEq total) by mouth daily.  . [DISCONTINUED] Tiotropium Bromide Monohydrate (SPIRIVA RESPIMAT) 2.5 MCG/ACT AERS Inhale 2 puffs into the lungs daily.   No facility-administered encounter medications on file as of 07/31/2018.     Activities of Daily Living In your present state of health, do you have any difficulty performing the following activities: 07/31/2018  Hearing? N  Vision? N  Difficulty concentrating or making decisions? N  Some recent data might be hidden     Patient Care Team: [name], MD as PCP - General (Internal Medicine) Chesley Mires, MD as Consulting Physician (Pulmonary Disease) Renato Shin, MD as Consulting Physician (Endocrinology) Bensimhon, Shaune Pascal, MD as Consulting Physician (Cardiology)    Assessment:   This is a routine wellness examination for [place]. Physical assessment deferred to PCP.   Exercise Activities and Dietary recommendations   Diet (meal preparation, eat out, water intake, caffeinated beverages, dairy products, fruits and vegetables): in general, a "healthy" diet  , well balanced   Reviewed heart healthy and diabetic diet. Discussed weight loss strategies.  Goals    . Patient Stated     I want to work towards getting my HGB A1c lower by continuing to eat healthy and exercise. Worship God, love family, and life.       Fall Risk Fall Risk  07/31/2018 12/12/2017 11/01/2017 06/06/2017 02/07/2017  Falls in the past year? No No No No Yes  Number falls in past yr: - - - - -  Injury with Fall? - - - - No  Comment - - - - -  Risk for fall due to : Impaired balance/gait;Impaired mobility - - History of fall(s) Impaired balance/gait;Impaired mobility;Impaired vision  Risk for fall due to: Comment - - - - Fall prevention education provided     Depression Screen PHQ 2/9 Scores 07/31/2018 12/12/2017 11/01/2017 06/06/2017  PHQ - 2 Score 0 0 0 0     Cognitive Function MMSE - Mini Mental State Exam 07/31/2018  Orientation to time 5  Orientation to Place 5  Registration 3  Attention/ Calculation 5  Recall 2  Language- name 2 objects 2  Language- repeat 1  Language- follow 3 step command 3  Language- read & follow direction 1  Write a sentence 1  Copy design 1  Total score 29       Immunization History  Administered Date(s) Administered  . Influenza Split 11/16/2011, 09/26/2012  . Influenza Whole 12/27/2004, 10/21/2008, 10/20/2009, 11/11/2010  . Influenza, High Dose Seasonal PF 10/14/2016,  10/10/2017  . Influenza,inj,Quad PF,6+ Mos 09/24/2013, 12/16/2014, 09/19/2015  . PPD Test 05/31/2015, 07/07/2016  . Pneumococcal Polysaccharide-23 12/16/2014  . Pneumococcal-Unspecified 05/31/2015  . Td 06/15/2010   Screening Tests Health Maintenance  Topic Date Due  . PNA vac Low Risk Adult (2 of 2 - PCV13) 05/30/2016  . URINE MICROALBUMIN  09/08/2017  . INFLUENZA VACCINE  07/27/2018  . HEMOGLOBIN A1C  01/19/2019  . DEXA SCAN  02/16/2019  . OPHTHALMOLOGY EXAM  06/06/2019  . FOOT EXAM  07/20/2019  . TETANUS/TDAP  06/15/2020      Plan:     Continue doing brain stimulating activities (puzzles, reading, adult coloring books, staying active) to keep memory sharp.   Continue to eat heart healthy diet (full of fruits, vegetables, whole grains, lean protein, water--limit salt, fat, and sugar intake) and increase physical activity as tolerated.  I have personally reviewed and noted the following in the patient's chart:   . Medical and social history . Use of alcohol, tobacco or illicit drugs  . Current medications and supplements . Functional ability and status . Nutritional status . Physical activity . Advanced directives . List of other physicians . Vitals . Screenings to include cognitive, depression, and falls . Referrals and appointments  In addition, I have reviewed and discussed with patient certain preventive protocols, quality metrics, and best practice recommendations. A written personalized care plan for preventive services as well as general preventive health recommendations were provided to patient.     Michiel Cowboy, RN  07/31/2018   Medical screening examination/treatment/procedure(s) were performed by non-physician practitioner and as supervising physician I was immediately available for consultation/collaboration. I agree with above. [name], MD

## 2018-07-30 NOTE — Patient Instructions (Addendum)
  Test(s) ordered today. Your results will be released to [place] (or called to you) after review, usually within 72hours after test completion. If any changes need to be made, you will be notified at that same time.   prevnar pneumonia administered today.   Medications reviewed and updated.   No changes recommended at this time.     Please followup in 6 months

## 2018-07-30 NOTE — Progress Notes (Signed)
Subjective:    Patient ID: [name], female    DOB: 29-May-1931, 84 y.o.   MRN: [id number]  HPI The patient is here for follow up.  Diabetes: She sees Dr Loanne Drilling.  She is taking her medication daily as prescribed. She is compliant with a diabetic diet. She is not exercising regularly due to nail removal that has not healed - she does water exercises.  She checks her feet daily and denies foot lesions. She is up-to-date with an ophthalmology examination.   Afib/flutter, chronic diastolic heart failure, Hypertension: She is taking her medication daily. She is compliant with a low sodium diet.  She denies chest pain, palpitations, edema and regular headaches. She is not exercising regularly.      Hyperlipidemia: She is taking her medication daily. She is compliant with a low fat/cholesterol diet. She is not exercising regularly. She denies myalgias.   COPD:  She is using her inhalers as prescribed.  She has mild SOB with exertion, but that is better than it was.  She denies cough, wheeze.  She uses the spiriva daily.    Joint pain;  She takes tylenol as needed only.  She was advised to take the tylenol more regularly.    Medications and allergies reviewed with patient and updated if appropriate.  Patient Active Problem List   Diagnosis Date Noted  . Arthritis of left sacroiliac joint 05/04/2017  . Osteopenia 02/17/2017  . Piriformis syndrome of left side 02/16/2017  . Acquired leg length discrepancy 02/16/2017  . COPD GOLD I  01/27/2017  . Lumbar radiculopathy, acute 01/27/2017  . Rash 01/27/2017  . Granuloma annulare 07/27/2016  . Numbness of fingers 07/27/2016  . Primary osteoarthritis of right knee 06/27/2016  . Diabetes ([place]) 03/07/2016  . Acute renal failure superimposed on stage 3 chronic kidney disease ([place])   . Multifocal pneumonia 05/24/2015  . Sepsis ([place]) 05/24/2015  . OSA on CPAP 08/28/2014  . Chronic diastolic heart failure ([place]) 07/30/2014  . Nocturnal  hypoxemia 07/09/2014  . SOB (shortness of breath) 05/15/2014  . Pulmonary HTN ([place]) 05/15/2014  . Crescendo angina ([place]) 05/02/2014  . Band keratopathy 01/16/2014  . 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 Epiphora due to insufficient drainage 06/13/2013  . Hyperthyroidism 11/04/2011  . Multinodular goiter 10/14/2011  . Atrophy, Fuchs' 09/29/2011  . ATRIAL FIBRILLATION 06/03/2010  . CHF 02/17/2010  . VITILIGO 11/07/2009  . SINOATRIAL NODE DYSFUNCTION 06/16/2009  . Iron deficiency anemia 01/02/2009  . ANEMIA, PERNICIOUS 11/20/2008  . Atrial flutter ([place]) 06/25/2008  . HYPERLIPIDEMIA 01/29/2008  . Coronary atherosclerosis 01/29/2008  . HTN (hypertension) 10/30/2007    Current Outpatient Medications on File Prior to Visit  Medication Sig Dispense Refill  . acetaminophen (TYLENOL) 500 MG tablet Take 1,000 mg by mouth every 6 (six) hours as needed (pain).    Marland Kitchen apixaban (ELIQUIS) 5 MG TABS tablet Take 1 tablet (5 mg total) by mouth 2 (two) times daily. 180 tablet 2  . Blood Glucose Monitoring Suppl (ONETOUCH VERIO IQ SYSTEM) w/Device KIT Use to check sugar twice daily 1 kit 0  . CALCIUM-MAG-VIT C-VIT D PO Take 1 tablet by mouth daily.    . carvedilol (COREG) 6.25 MG tablet Take 1 tablet (6.25 mg total) by mouth 2 (two) times daily with a meal. 180 tablet 3  . cholecalciferol (VITAMIN D) 1000 units tablet Take 1,000 Units by mouth daily after supper.    . furosemide (LASIX) 80 MG tablet Take 1 tablet (80 mg  total) by mouth 2 (two) times daily. 180 tablet 3  . glucose blood (ONETOUCH VERIO) test strip Use as instructed to check sugar 2 times daily 200 each 5  . insulin NPH-regular Human (NOVOLIN 70/30 RELION) (70-30) 100 UNIT/ML injection Inject 25 Units into the skin daily with breakfast. And syringes 1/day 10 mL 11  . Lancets (ONETOUCH ULTRASOFT) lancets USE TO CHECK BLOOD SUGAR 2  TIMES PER DAY. 200 each 2  . Multiple Vitamin (MULTIVITAMIN WITH MINERALS) TABS tablet Take 1 tablet by  mouth daily. Centrum Silver    . nitroGLYCERIN (NITROSTAT) 0.4 MG SL tablet Place 1 tablet (0.4 mg total) under the tongue every 5 (five) minutes as needed for chest pain. Reported on 01/28/2016 30 tablet 3  . potassium chloride SA (K-DUR,KLOR-CON) 20 MEQ tablet Take 2 tablets (40 mEq total) by mouth daily. 180 tablet 3  . Tiotropium Bromide Monohydrate (SPIRIVA RESPIMAT) 2.5 MCG/ACT AERS Inhale 2 puffs into the lungs daily. 3 Inhaler 3   No current facility-administered medications on file prior to visit.     Past Medical History:  Diagnosis Date  . Anemia    iron defficiency  . Anginal pain ([place])   . Arthritis    "knees, feet, hands; joints" (05/27/2015)  . Asthma   . Atrial fibrillation or flutter    s/p RFCA 7/08;   s/p DCCV in past;   previously on amiodarone;  amio stopped due to lung toxicity  . CAD (coronary artery disease)    s/p NSTEMI tx with BMS to OM1 3/08;  cath 3/08: pOM 99% tx with PCI, pLAD 20%, ? mod stenosis at the AM  . CAP (community acquired pneumonia) 05/24/2015  . Chronic diastolic heart failure ([facility])    echo 11/11:  EF 55-60%, severe LVH, mod LAE, mild MR, mildly increased PASP  . COPD (chronic obstructive pulmonary disease) ([place])   . Degenerative joint disease   . Dystrophy, corneal stromal   . Heart murmur   . HLD (hyperlipidemia)   . HTN (hypertension)    essential nos  . Hypopotassemia    PMH of  . Muscle pain   . Myocardial infarction ([place]) 2008  . OSA on CPAP   . Osteoporosis   . Pneumonia 05/27/2015  . Protein calorie malnutrition ([place])   . Rash and nonspecific skin eruption    both arms,awaiting bio   dr Delman Cheadle  . Seasonal allergies   . Shortness of breath dyspnea   . Type II diabetes mellitus ([facility])    Dr Loanne Drilling    Past Surgical History:  Procedure Laterality Date  . A FLUTTER ABLATION     Dr Lovena Le  . CATARACT EXTRACTION W/ INTRAOCULAR LENS  IMPLANT, BILATERAL Bilateral   . CHOLECYSTECTOMY    . COLONOSCOPY  6/07   2 polyps Dr. Kinnie Feil  in [place]  . colonoscopy with polypectomy      X 2; [facility]  . CORNEAL TRANSPLANT Bilateral   . CORONARY ANGIOPLASTY WITH STENT PLACEMENT  02/2007   [facility] w/Dr Lovena Le  . EYE SURGERY    . gravid 2 para 2    . KNEE CARTILAGE SURGERY Right   . LEFT AND RIGHT HEART CATHETERIZATION WITH CORONARY ANGIOGRAM N/A 05/07/2014   Procedure: LEFT AND RIGHT HEART CATHETERIZATION WITH CORONARY ANGIOGRAM;  Surgeon: Peter M Martinique, MD;  Location[facility];  Service: Cardiovascular;  Laterality: N/A;  . TOTAL KNEE ARTHROPLASTY Right 06/28/2016   Procedure: TOTAL KNEE ARTHROPLASTY;  Surgeon: Frederik Pear, MD;  Location: [facility];  Service: Orthopedics;  Laterality: Right;    Social History   Socioeconomic History  . Marital status: Widowed    Spouse name: Not on file  . Number of children: Not on file  . Years of education: Not on file  . Highest education level: Not on file  Occupational History  . Occupation: Pharmacist, hospital  Social Needs  . Financial resource strain: Not hard at all  . Food insecurity:    Worry: Never true    Inability: Never true  . Transportation needs:    Medical: No    Non-medical: No  Tobacco Use  . Smoking status: Former Smoker    Packs/day: 0.25    Years: 18.00    Pack years: 4.50    Types: Cigarettes    Last attempt to quit: 12/27/1968    Years since quitting: 49.6  . Smokeless tobacco: Never Used  . Tobacco comment: smoked [place], up to 1 pp week  Substance and Sexual Activity  . Alcohol use: Yes    Comment: occ  . Drug use: No  . Sexual activity: Never  Lifestyle  . Physical activity:    Days per week: Not on file    Minutes per session: Not on file  . Stress: Not on file  Relationships  . Social connections:    Talks on phone: Not on file    Gets together: Not on file    Attends religious service: Not on file    Active member of club or organization: Not on file    Attends meetings of clubs or organizations: Not on file    Relationship status: Not on file    Other Topics Concern  . Not on file  Social History Narrative   Teacher. Widowed. Rarely drinks cafeine.     Family History  Problem Relation Age of Onset  . Diabetes Mother   . Hypertension Mother   . Transient ischemic attack Mother   . Arthritis Mother   . Heart attack Father 14  . Arthritis Father   . Hypertension Father   . Breast cancer Maternal Aunt   . Arthritis Maternal Aunt     Review of Systems  Constitutional: Negative for chills and fever.  Respiratory: Positive for shortness of breath (with certain amount of exertion - overall better). Negative for cough and wheezing.   Cardiovascular: Negative for chest pain, palpitations and leg swelling.  Musculoskeletal: Positive for arthralgias.  Neurological: Negative for dizziness, light-headedness and headaches.       Objective:   Vitals:   07/31/18 0949  BP: 124/76  Pulse: 70  Resp: 16  Temp: 97.6 F (36.4 C)  SpO2: 96%   BP Readings from Last 3 Encounters:  07/31/18 124/76  07/31/18 124/76  07/19/18 138/68   Wt Readings from Last 3 Encounters:  07/31/18 173 lb (78.5 kg)  07/31/18 173 lb (78.5 kg)  07/19/18 164 lb 3.2 oz (74.5 kg)   Body mass index is 31.64 kg/m.   Physical Exam    Constitutional: Appears well-developed and well-nourished. No distress.  HENT:  Head: Normocephalic and atraumatic.  Neck: Neck supple. No tracheal deviation present. No thyromegaly present.  No cervical lymphadenopathy Cardiovascular: Normal rate, mildly irregular rhythm and normal heart sounds.   No murmur heard. No carotid bruit .  No edema Pulmonary/Chest: Effort normal and breath sounds normal. No respiratory distress. No has no wheezes. No rales.  Skin: Skin is warm and dry. Not diaphoretic.  Psychiatric: Normal mood  and affect. Behavior is normal.      Assessment & Plan:    See Problem List for Assessment and Plan of chronic medical problems.

## 2018-07-31 ENCOUNTER — Other Ambulatory Visit (INDEPENDENT_AMBULATORY_CARE_PROVIDER_SITE_OTHER): Payer: Medicare Other

## 2018-07-31 ENCOUNTER — Ambulatory Visit (INDEPENDENT_AMBULATORY_CARE_PROVIDER_SITE_OTHER): Payer: Medicare Other | Admitting: Internal Medicine

## 2018-07-31 ENCOUNTER — Ambulatory Visit (INDEPENDENT_AMBULATORY_CARE_PROVIDER_SITE_OTHER): Payer: Medicare Other | Admitting: *Deleted

## 2018-07-31 ENCOUNTER — Encounter: Payer: Self-pay | Admitting: Internal Medicine

## 2018-07-31 VITALS — BP 124/76 | HR 70 | Temp 97.6°F | Resp 18 | Ht 62.0 in | Wt 173.0 lb

## 2018-07-31 VITALS — BP 124/76 | HR 70 | Temp 97.6°F | Resp 16 | Wt 173.0 lb

## 2018-07-31 DIAGNOSIS — Z0000 Encounter for general adult medical examination without abnormal findings: Secondary | ICD-10-CM | POA: Diagnosis not present

## 2018-07-31 DIAGNOSIS — I10 Essential (primary) hypertension: Secondary | ICD-10-CM

## 2018-07-31 DIAGNOSIS — I5032 Chronic diastolic (congestive) heart failure: Secondary | ICD-10-CM | POA: Diagnosis not present

## 2018-07-31 DIAGNOSIS — E1122 Type 2 diabetes mellitus with diabetic chronic kidney disease: Secondary | ICD-10-CM

## 2018-07-31 DIAGNOSIS — Z794 Long term (current) use of insulin: Secondary | ICD-10-CM

## 2018-07-31 DIAGNOSIS — I482 Chronic atrial fibrillation, unspecified: Secondary | ICD-10-CM

## 2018-07-31 DIAGNOSIS — N183 Chronic kidney disease, stage 3 unspecified: Secondary | ICD-10-CM

## 2018-07-31 DIAGNOSIS — Z23 Encounter for immunization: Secondary | ICD-10-CM | POA: Diagnosis not present

## 2018-07-31 DIAGNOSIS — E782 Mixed hyperlipidemia: Secondary | ICD-10-CM

## 2018-07-31 LAB — COMPREHENSIVE METABOLIC PANEL
ALT: 19 U/L (ref 0–35)
AST: 25 U/L (ref 0–37)
Albumin: 4.2 g/dL (ref 3.5–5.2)
Alkaline Phosphatase: 90 U/L (ref 39–117)
BUN: 27 mg/dL — ABNORMAL HIGH (ref 6–23)
CALCIUM: 10.6 mg/dL — AB (ref 8.4–10.5)
CHLORIDE: 102 meq/L (ref 96–112)
CO2: 30 meq/L (ref 19–32)
CREATININE: 1.18 mg/dL (ref 0.40–1.20)
GFR: 56.02 mL/min — ABNORMAL LOW (ref >60.00)
Glucose, Bld: 203 mg/dL — ABNORMAL HIGH (ref 70–99)
POTASSIUM: 4.6 meq/L (ref 3.5–5.1)
Sodium: 140 mEq/L (ref 135–145)
Total Bilirubin: 0.6 mg/dL (ref 0.2–1.2)
Total Protein: 7.6 g/dL (ref 6.0–8.3)

## 2018-07-31 LAB — CBC WITH DIFFERENTIAL/PLATELET
BASOS PCT: 1.8 % (ref 0.0–3.0)
Basophils Absolute: 0.1 10*3/uL (ref 0.0–0.1)
EOS PCT: 7.7 % — AB (ref 0.0–5.0)
Eosinophils Absolute: 0.4 10*3/uL (ref 0.0–0.7)
HCT: 48.8 % — ABNORMAL HIGH (ref 36.0–46.0)
Hemoglobin: 15.7 g/dL — ABNORMAL HIGH (ref 12.0–15.0)
LYMPHS ABS: 1 10*3/uL (ref 0.7–4.0)
LYMPHS PCT: 20.4 % (ref 12.0–46.0)
MCHC: 32.2 g/dL (ref 30.0–36.0)
MCV: 93 fl (ref 78.0–100.0)
MONOS PCT: 13.7 % — AB (ref 3.0–12.0)
Monocytes Absolute: 0.7 10*3/uL (ref 0.1–1.0)
NEUTROS ABS: 2.7 10*3/uL (ref 1.4–7.7)
NEUTROS PCT: 56.4 % (ref 43.0–77.0)
PLATELETS: 156 10*3/uL (ref 150.0–400.0)
RBC: 5.25 Mil/uL — AB (ref 3.87–5.11)
RDW: 13.9 % (ref 11.5–15.5)
WBC: 4.9 10*3/uL (ref 4.0–10.5)

## 2018-07-31 LAB — MICROALBUMIN / CREATININE URINE RATIO
Creatinine,U: 81.5 mg/dL
MICROALB UR: 11.9 mg/dL — AB (ref 0.0–1.9)
Microalb Creat Ratio: 14.7 mg/g (ref 0.0–30.0)

## 2018-07-31 LAB — LIPID PANEL
CHOL/HDL RATIO: 6
Cholesterol: 279 mg/dL — ABNORMAL HIGH (ref 0–200)
HDL: 43 mg/dL (ref >39.00)
LDL CALC: 216 mg/dL — AB (ref 0–99)
NonHDL: 236.29
TRIGLYCERIDES: 102 mg/dL (ref 0.0–149.0)
VLDL: 20.4 mg/dL (ref 0.0–40.0)

## 2018-07-31 MED ORDER — TIOTROPIUM BROMIDE MONOHYDRATE 18 MCG IN CAPS: 18.0000 ug | ORAL_CAPSULE | Freq: Every day | RESPIRATORY_TRACT | 3 refills | Status: DC

## 2018-07-31 NOTE — Assessment & Plan Note (Signed)
euvolemic on exam Continue current medications Cmp, cbc

## 2018-07-31 NOTE — Patient Instructions (Addendum)
Continue doing brain stimulating activities (puzzles, reading, adult coloring books, staying active) to keep memory sharp.   Continue to eat heart healthy diet (full of fruits, vegetables, whole grains, lean protein, water--limit salt, fat, and sugar intake) and increase physical activity as tolerated.   [name] , Thank you for taking time to come for your Medicare Wellness Visit. I appreciate your ongoing commitment to your health goals. Please review the following plan we discussed and let me know if I can assist you in the future.   These are the goals we discussed: Goals    . Patient Stated     I want to work towards getting my HGB A1c lower by continuing to eat healthy and exercise. Worship God, love family, and life.       This is a list of the screening recommended for you and due dates:  Health Maintenance  Topic Date Due  . Pneumonia vaccines (2 of 2 - PCV13) 05/30/2016  . Urine Protein Check  09/08/2017  . Flu Shot  07/27/2018  . Hemoglobin A1C  01/19/2019  . DEXA scan (bone density measurement)  02/16/2019  . Eye exam for diabetics  06/06/2019  . Complete foot exam   07/20/2019  . Tetanus Vaccine  06/15/2020   fallsIt is important to avoid accidents which may result in broken bones.  Here are a few ideas on how to make your home safer so you will be less likely to trip or fall.  1. Use nonskid mats or non slip strips in your shower or tub, on your bathroom floor and around sinks.  If you know that you have spilled water, wipe it up! 2. In the bathroom, it is important to have properly installed grab bars on the walls or on the edge of the tub.  Towel racks are NOT strong enough for you to hold onto or to pull on for support. 3. Stairs and hallways should have enough light.  Add lamps or night lights if you need ore light. 4. It is good to have handrails on both sides of the stairs if possible.  Always fix broken handrails right away. 5. It is important to see the edges  of steps.  Paint the edges of outdoor steps white so you can see them better.  Put colored tape on the edge of inside steps. 6. Throw-rugs are dangerous because they can slide.  Removing the rugs is the best idea, but if they must stay, add adhesive carpet tape to prevent slipping. 7. Do not keep things on stairs or in the halls.  Remove small furniture that blocks the halls as it may cause you to trip.  Keep telephone and electrical cords out of the way where you walk. 8. Always were sturdy, rubber-soled shoes for good support.  Never wear just socks, especially on the stairs.  Socks may cause you to slip or fall.  Do not wear full-length housecoats as you can easily trip on the bottom.  9. Place the things you use the most on the shelves that are the easiest to reach.  If you use a stepstool, make sure it is in good condition.  If you feel unsteady, DO NOT climb, ask for help. If a health professional advises you to use a cane or walker, do not be ashamed.  These items can keep you from falling and breaking your bones.Health Maintenance, Female Adopting a healthy lifestyle and getting preventive care can go a long way to promote health  and wellness. Talk with your health care provider about what schedule of regular examinations is right for you. This is a good chance for you to check in with your provider about disease prevention and staying healthy. In between checkups, there are plenty of things you can do on your own. Experts have done a lot of research about which lifestyle changes and preventive measures are most likely to keep you healthy. Ask your health care provider for more information. Weight and diet Eat a healthy diet  Be sure to include plenty of vegetables, fruits, low-fat dairy products, and lean protein.  Do not eat a lot of foods high in solid fats, added sugars, or salt.  Get regular exercise. This is one of the most important things you can do for your health. ? Most adults  should exercise for at least 150 minutes each week. The exercise should increase your heart rate and make you sweat (moderate-intensity exercise). ? Most adults should also do strengthening exercises at least twice a week. This is in addition to the moderate-intensity exercise.  Maintain a healthy weight  Body mass index (BMI) is a measurement that can be used to identify possible weight problems. It estimates body fat based on height and weight. Your health care provider can help determine your BMI and help you achieve or maintain a healthy weight.  For females 56 years of age and older: ? A BMI below 18.5 is considered underweight. ? A BMI of 18.5 to 24.9 is normal. ? A BMI of 25 to 29.9 is considered overweight. ? A BMI of 30 and above is considered obese.  Watch levels of cholesterol and blood lipids  You should start having your blood tested for lipids and cholesterol at 82 years of age, then have this test every 5 years.  You may need to have your cholesterol levels checked more often if: ? Your lipid or cholesterol levels are high. ? You are older than 82 years of age. ? You are at high risk for heart disease.  Cancer screening Lung Cancer  Lung cancer screening is recommended for adults 15-30 years old who are at high risk for lung cancer because of a history of smoking.  A yearly low-dose CT scan of the lungs is recommended for people who: ? Currently smoke. ? Have quit within the past 15 years. ? Have at least a 30-pack-year history of smoking. A pack year is smoking an average of one pack of cigarettes a day for 1 year.  Yearly screening should continue until it has been 15 years since you quit.  Yearly screening should stop if you develop a health problem that would prevent you from having lung cancer treatment.  Breast Cancer  Practice breast self-awareness. This means understanding how your breasts normally appear and feel.  It also means doing regular breast  self-exams. Let your health care provider know about any changes, no matter how small.  If you are in your 20s or 30s, you should have a clinical breast exam (CBE) by a health care provider every 1-3 years as part of a regular health exam.  If you are 38 or older, have a CBE every year. Also consider having a breast X-ray (mammogram) every year.  If you have a family history of breast cancer, talk to your health care provider about genetic screening.  If you are at high risk for breast cancer, talk to your health care provider about having an MRI and a mammogram every year.  Breast cancer gene (BRCA) assessment is recommended for women who have family members with BRCA-related cancers. BRCA-related cancers include: ? Breast. ? Ovarian. ? Tubal. ? Peritoneal cancers.  Results of the assessment will determine the need for genetic counseling and BRCA1 and BRCA2 testing.  Cervical Cancer Your health care provider may recommend that you be screened regularly for cancer of the pelvic organs (ovaries, uterus, and vagina). This screening involves a pelvic examination, including checking for microscopic changes to the surface of your cervix (Pap test). You may be encouraged to have this screening done every 3 years, beginning at age 40.  For women ages 42-65, health care providers may recommend pelvic exams and Pap testing every 3 years, or they may recommend the Pap and pelvic exam, combined with testing for human papilloma virus (HPV), every 5 years. Some types of HPV increase your risk of cervical cancer. Testing for HPV may also be done on women of any age with unclear Pap test results.  Other health care providers may not recommend any screening for nonpregnant women who are considered low risk for pelvic cancer and who do not have symptoms. Ask your health care provider if a screening pelvic exam is right for you.  If you have had past treatment for cervical cancer or a condition that could  lead to cancer, you need Pap tests and screening for cancer for at least 20 years after your treatment. If Pap tests have been discontinued, your risk factors (such as having a new sexual partner) need to be reassessed to determine if screening should resume. Some women have medical problems that increase the chance of getting cervical cancer. In these cases, your health care provider may recommend more frequent screening and Pap tests.  Colorectal Cancer  This type of cancer can be detected and often prevented.  Routine colorectal cancer screening usually begins at 82 years of age and continues through 82 years of age.  Your health care provider may recommend screening at an earlier age if you have risk factors for colon cancer.  Your health care provider may also recommend using home test kits to check for hidden blood in the stool.  A small camera at the end of a tube can be used to examine your colon directly (sigmoidoscopy or colonoscopy). This is done to check for the earliest forms of colorectal cancer.  Routine screening usually begins at age 13.  Direct examination of the colon should be repeated every 5-10 years through 82 years of age. However, you may need to be screened more often if early forms of precancerous polyps or small growths are found.  Skin Cancer  Check your skin from head to toe regularly.  Tell your health care provider about any new moles or changes in moles, especially if there is a change in a mole's shape or color.  Also tell your health care provider if you have a mole that is larger than the size of a pencil eraser.  Always use sunscreen. Apply sunscreen liberally and repeatedly throughout the day.  Protect yourself by wearing long sleeves, pants, a wide-brimmed hat, and sunglasses whenever you are outside.  Heart disease, diabetes, and high blood pressure  High blood pressure causes heart disease and increases the risk of stroke. High blood pressure  is more likely to develop in: ? People who have blood pressure in the high end of the normal range (130-139/85-89 mm Hg). ? People who are overweight or obese. ? People who are African American.  If you are 56-31 years of age, have your blood pressure checked every 3-5 years. If you are 63 years of age or older, have your blood pressure checked every year. You should have your blood pressure measured twice-once when you are at a hospital or clinic, and once when you are not at a hospital or clinic. Record the average of the two measurements. To check your blood pressure when you are not at a hospital or clinic, you can use: ? An automated blood pressure machine at a pharmacy. ? A home blood pressure monitor.  If you are between 96 years and 40 years old, ask your health care provider if you should take aspirin to prevent strokes.  Have regular diabetes screenings. This involves taking a blood sample to check your fasting blood sugar level. ? If you are at a normal weight and have a low risk for diabetes, have this test once every three years after 82 years of age. ? If you are overweight and have a high risk for diabetes, consider being tested at a younger age or more often. Preventing infection Hepatitis B  If you have a higher risk for hepatitis B, you should be screened for this virus. You are considered at high risk for hepatitis B if: ? You were born in a country where hepatitis B is common. Ask your health care provider which countries are considered high risk. ? Your parents were born in a high-risk country, and you have not been immunized against hepatitis B (hepatitis B vaccine). ? You have HIV or AIDS. ? You use needles to inject street drugs. ? You live with someone who has hepatitis B. ? You have had sex with someone who has hepatitis B. ? You get hemodialysis treatment. ? You take certain medicines for conditions, including cancer, organ transplantation, and autoimmune  conditions.  Hepatitis C  Blood testing is recommended for: ? Everyone born from [birth year] through 1963. ? Anyone with known risk factors for hepatitis C.  Sexually transmitted infections (STIs)  You should be screened for sexually transmitted infections (STIs) including gonorrhea and chlamydia if: ? You are sexually active and are younger than 82 years of age. ? You are older than 82 years of age and your health care provider tells you that you are at risk for this type of infection. ? Your sexual activity has changed since you were last screened and you are at an increased risk for chlamydia or gonorrhea. Ask your health care provider if you are at risk.  If you do not have HIV, but are at risk, it may be recommended that you take a prescription medicine daily to prevent HIV infection. This is called pre-exposure prophylaxis (PrEP). You are considered at risk if: ? You are sexually active and do not regularly use condoms or know the HIV status of your partner(s). ? You take drugs by injection. ? You are sexually active with a partner who has HIV.  Talk with your health care provider about whether you are at high risk of being infected with HIV. If you choose to begin PrEP, you should first be tested for HIV. You should then be tested every 3 months for as long as you are taking PrEP. Pregnancy  If you are premenopausal and you may become pregnant, ask your health care provider about preconception counseling.  If you may become pregnant, take 400 to 800 micrograms (mcg) of folic acid every day.  If you want to prevent pregnancy, talk to  your health care provider about birth control (contraception). Osteoporosis and menopause  Osteoporosis is a disease in which the bones lose minerals and strength with aging. This can result in serious bone fractures. Your risk for osteoporosis can be identified using a bone density scan.  If you are 73 years of age or older, or if you are at risk for  osteoporosis and fractures, ask your health care provider if you should be screened.  Ask your health care provider whether you should take a calcium or vitamin D supplement to lower your risk for osteoporosis.  Menopause may have certain physical symptoms and risks.  Hormone replacement therapy may reduce some of these symptoms and risks. Talk to your health care provider about whether hormone replacement therapy is right for you. Follow these instructions at home:  Schedule regular health, dental, and eye exams.  Stay current with your immunizations.  Do not use any tobacco products including cigarettes, chewing tobacco, or electronic cigarettes.  If you are pregnant, do not drink alcohol.  If you are breastfeeding, limit how much and how often you drink alcohol.  Limit alcohol intake to no more than 1 drink per day for nonpregnant women. One drink equals 12 ounces of beer, 5 ounces of wine, or 1 ounces of hard liquor.  Do not use street drugs.  Do not share needles.  Ask your health care provider for help if you need support or information about quitting drugs.  Tell your health care provider if you often feel depressed.  Tell your health care provider if you have ever been abused or do not feel safe at home. This information is not intended to replace advice given to you by your health care provider. Make sure you discuss any questions you have with your health care provider. Document Released: 06/28/2011 Document Revised: 05/20/2016 Document Reviewed: 09/16/2015 Elsevier Interactive Patient Education  [employer].

## 2018-07-31 NOTE — Assessment & Plan Note (Signed)
Sugars are well controlled management per Dr Loanne Drilling Will start exercising once she is able

## 2018-07-31 NOTE — Assessment & Plan Note (Signed)
BP well controlled Current regimen effective and well tolerated Continue current medications at current doses cmp  

## 2018-07-31 NOTE — Assessment & Plan Note (Signed)
Statin intolerant Check lipids, cmp  

## 2018-07-31 NOTE — Assessment & Plan Note (Signed)
Mildly irregular, rate controlled On eliquis Cbc, cmp

## 2018-08-01 ENCOUNTER — Encounter: Payer: Self-pay | Admitting: Internal Medicine

## 2018-08-01 NOTE — Telephone Encounter (Signed)
This was corrected at [place] 07/31/18

## 2018-08-08 ENCOUNTER — Other Ambulatory Visit: Payer: Self-pay

## 2018-08-10 NOTE — Telephone Encounter (Signed)
Opened by mistake.

## 2018-08-22 ENCOUNTER — Encounter: Payer: Self-pay | Admitting: *Deleted

## 2018-08-22 ENCOUNTER — Other Ambulatory Visit: Payer: Self-pay | Admitting: *Deleted

## 2018-08-22 NOTE — Patient Outreach (Signed)
[facility]) Care Management  08/22/2018  [name] [birth year] [id number]  Referral via EMMI-patient engagement tool=score 9; patient with dx COPD, HTN, Afib, DM; having issues getting Eliquis & Spiriva due to being in donut hole.   Telephone call #1; left HIPPA compliant voice mail.  Plan: Geophysicist/field seismologist. Will follow up 2-4 work days.  [name], RN BSN [place] Management Coordinator [facility] Care Management  [phone]

## 2018-08-24 ENCOUNTER — Other Ambulatory Visit: Payer: Self-pay | Admitting: *Deleted

## 2018-08-25 ENCOUNTER — Other Ambulatory Visit: Payer: Self-pay | Admitting: *Deleted

## 2018-08-25 NOTE — Patient Outreach (Addendum)
[facility]) Care Management  08/25/2018  [name] 1931/10/21 [id number]   EMMI campaign/prevent call/engagement Date: 08/08/18 Engagement tool Score: 9  Referral from [place] attempt # 3 Patient is able to verify HIPAA Reviewed and addressed referral to [facility] with patient Confirmed she had her wellness check via medicare and her main concern is she is in the donut hole until the first of January 2019  She is involved in [facility] CHF program, has a scale and does get calls from [employer] when weight is abnormal   Social: Lives her son but he will get married soon Her son and Daughter will be living close to her and still be supportive in her care.  She reports being independent with all ADLs & iADLs. She reports she still drives herself to medical appointments   Conditions: Afib, COPD, CHF, DM II (last A1c 6.40, HTN,   Medications: She reports her Eliquis and Spiriva are causing cost concerns. Dr Haroldine Laws manages  her Eliquis The cost of the Eliquis per mail order was $300 for a 3 month supply but she was able to pay only $115 this month  Dr Quay Burow manages the Spiriva and the cost for it this month per mail order was also $300 for a 3 month supply and she was able to get only a month supply She states she tried pradaxa but reports "it does not work for me" She also reports concern with the cost of Glucose meter test strips  She now has a $200+ out standing bill with [facility] which is her mail order company for 3 month supply medicines. She is not able to get her needed potassium at the cost of $30 related this outstanding bill   Appointments: Endocrinologist, [place] Anon to be seen on 09/20/18  Dr Quay Burow primary MD was seen 07/31/18 and to be seen again in 2020 Dr Haroldine Laws cardiologist to be seen in 09/2018   OTC DME needs a BP cuff and other supplies  [facility] RN CM discussed and referred her to her [place] health care medline OTC catalog for  assistance with getting BP and other various items. [name] voiced appreciation of this discussion. Questions answered about this process of ordering supplies   Advance Directives: She reports having a living will and POA but not Mental health advance directives and agrees to a referral to [facility]   Consent: [facility] RN CM reviewed [facility] services with patient. Patient gave verbal consent for services for [facility] and [facility] pharmacy. She denies need of services from [facility]/Telephonic RN CM, health coach  or NP at this time   Plan: [place] CM will refer her to pharmacy for polypharmacy, coverage gap concerns and medication/glucose meter test strip management A referral will be completed to [facility] for mental health advance directives and community resources  [facility] RN CM gave [name] Cm contact number for any future concerns  Kimberly L. Lavina Hamman, RN, BSN, [place] Management Care Coordinator Direct Number [phone] Mobile number [phone]  Main [facility] number [phone] Fax number [phone]

## 2018-08-25 NOTE — Patient Outreach (Signed)
[facility]) Care Management  08/25/2018  [name] Mar 20, 1931 [id number]  Referral via EMMI-patient engagement tool=score 9; patient with dx COPD, HTN, Afib, DM; having issues getting Eliquis & Spiriva due to being in donut hole  Telephone call #2; received voice mail from patient-call returned; left [place] compliant voice mail.  Plan: Follow up call to patient within 4 business days.  [name], RN BSN [place] Management Coordinator [facility] Care Management  [phone]

## 2018-08-27 IMAGING — CR DG CHEST 2V
2 series · 2 of 2 positions shown · non-contrast
Comparison: 06/02/2017

CLINICAL DATA: Chronic diastolic heart failure. Shortness of
breath.

EXAM:
CHEST  2 VIEW

[chest pa]
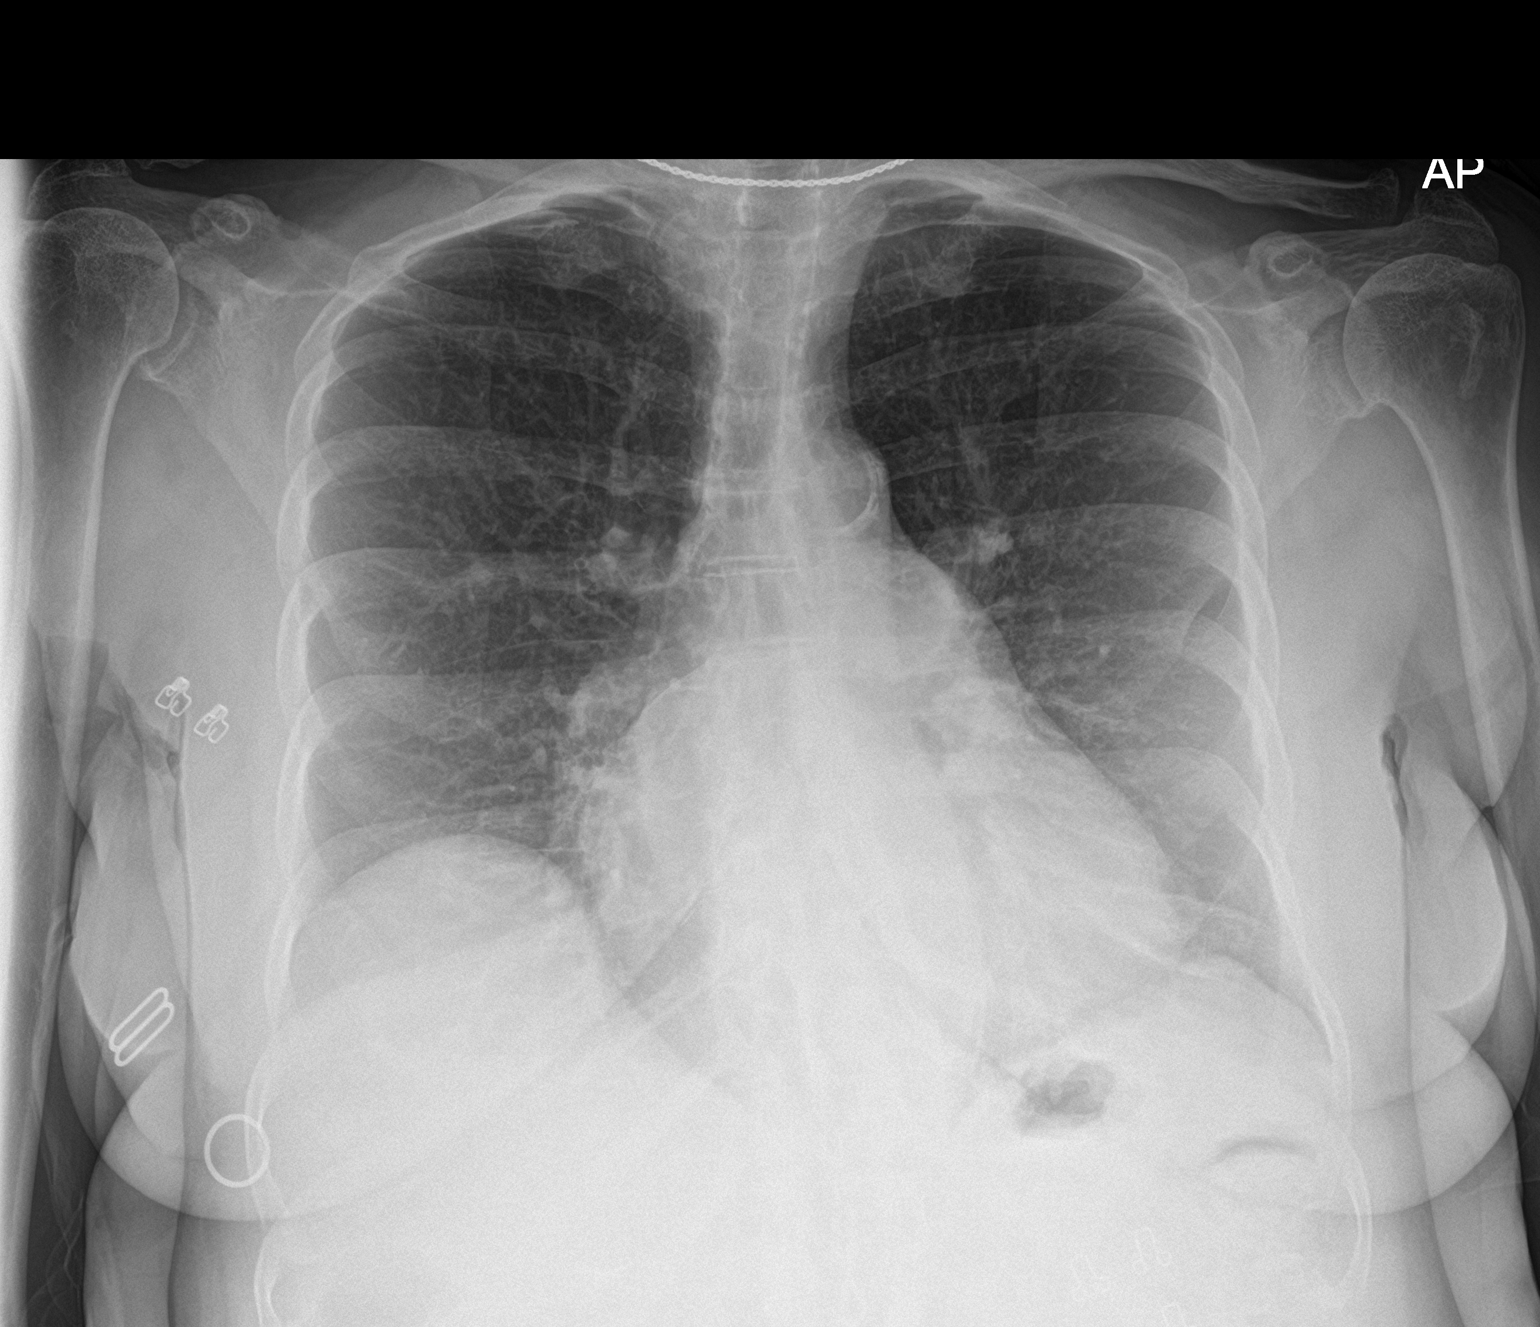

[chest lat]
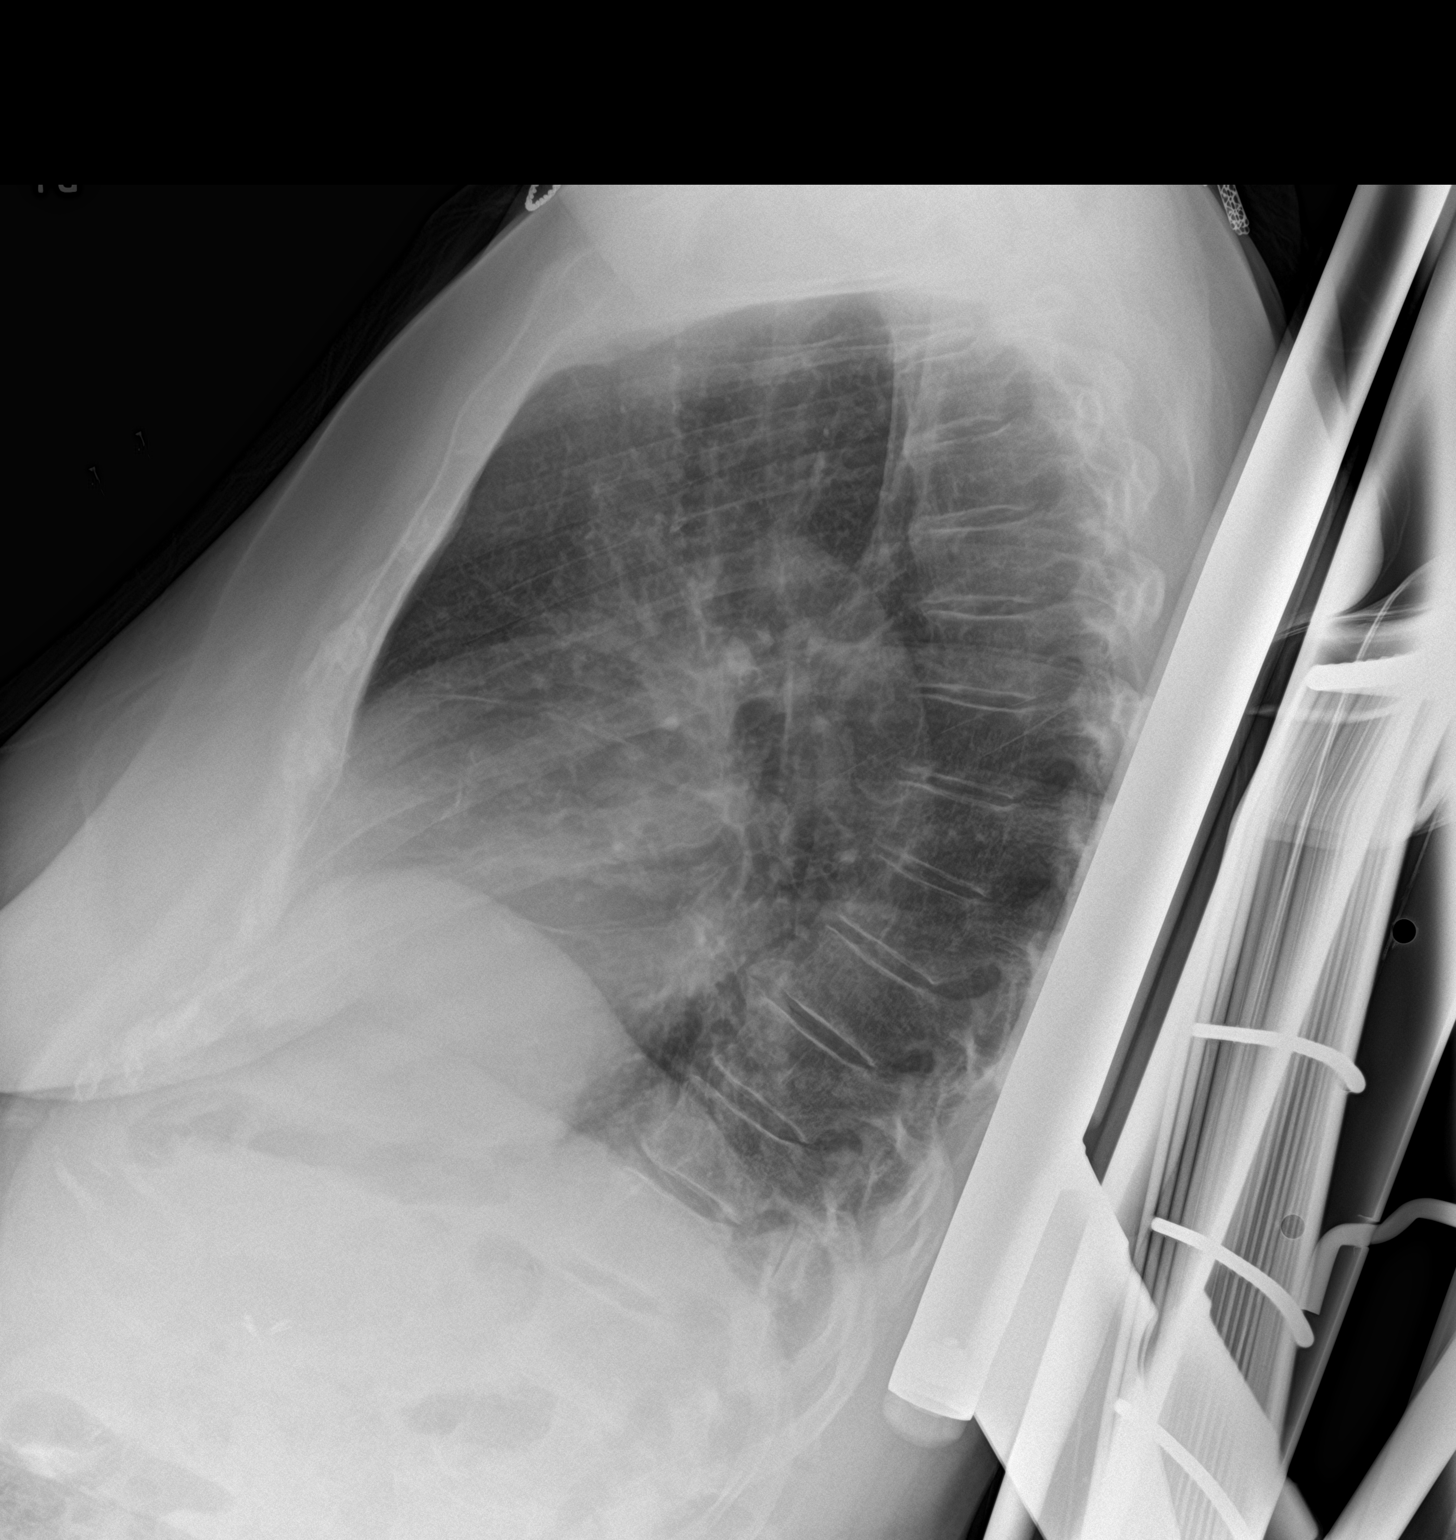

[2 of 2 positions shown; findings below may reference images not displayed]

FINDINGS: Mild cardiomegaly. No confluent airspace opacities or effusions.
Aortic atherosclerosis. No acute bony abnormality.
IMPRESSION: Mild cardiomegaly.  No acute findings.

## 2018-08-29 ENCOUNTER — Other Ambulatory Visit: Payer: Self-pay

## 2018-08-29 NOTE — Patient Outreach (Signed)
[place] [facility]) Care Management  08/29/2018  [name] 1931/07/25 [id number]  BSW attempted to contact the patient on today's date to assist with the completion of advance directives. Unfortunately, today's call was unsuccessful. BSW left the patient a HIPAA compliant voice message requesting a return call.   Plan: BSW will mail the patient an unsuccessful outreach letter. BSW will attempt the patient again within the next four business days.  Daneen Schick, BSW, CDP [facility] [phone]

## 2018-08-29 NOTE — Patient Outreach (Signed)
[facility]) Care Management  08/29/2018  [name] [birth year] [id number]  84 year old female referred to [place] Management.  [place] services requested for polypharmacy medication review and medication assistance for Spiriva and Eliquis.  PMHx includes, but not limited to, hypertension, atrial fibrillation, diastolic heart failure, pulmonary hypertension, COPD Gold I, hyperthyroidism, CKD Stage 3 and hyperlipidemia.  Unsuccessful outreach attempt #1 to [name].  Left HIPAA compliant voice message requesting a return call.  Plan: Outreach attempt #2 in 3-4 business days.  [name], PharmD Clinical Pharmacist [place] [phone]

## 2018-08-30 ENCOUNTER — Other Ambulatory Visit: Payer: Self-pay

## 2018-08-30 NOTE — Patient Outreach (Signed)
[facility]) Care Management  08/30/2018  [name] 1931/01/04 [id number]  Incoming call from the patient, HIPAA identifiers confirmed. BSW introduced self to the patient and explained the patient had been referred to social work for assistance with advance directives. The patient states she has a current health care power of attorney and living will. The patient does not wish to make any changes. Discipline closure. The patient is aware she will remain active with [place] services.  Daneen Schick, BSW, CDP [facility] [phone]

## 2018-08-30 NOTE — Patient Outreach (Signed)
[place] [facility]) Care Management  08/30/2018  [name] 18-Aug-1931 [id number]  84 year old female referred to [facility].  [facility] services requested for polypharmacy medication review and medication assistance for Spiriva and Eliquis.  PMHx includes, but not limited to, hypertension, atrial fibrillation, diastolic heart failure, pulmonary hypertension, COPD Gold I, hyperthyroidism, CKD Stage 3 and hyperlipidemia.  Incoming call received from [name]. HIPAA identifiers verified.   Medication Assistance: [name] states that she is in the coverage gap and is having trouble affording her Spiriva Handihaler and her Eliquis.  Per financial discussion, she does not qualify for Extra Help LIS.   Informed patient that we can apply for Sprivia inhaler because [employer] does not require an out of pocket (OOP) to apply.  [place] requires a patient spend 3% of income OOP to apply.  Based on our discussion, [name] will need to spend ~$936 to be eligible for Eliquis assistance.  Per [facility] patient's TROOP is [id number].  Patient may be eligible for Eliquis assistance.  Plan: Route letter to [facility], Etter Sjogren to begin patient assistance application for Eliquis and Spirvia.  [name], PharmD Clinical Pharmacist [place] [phone]

## 2018-09-01 ENCOUNTER — Other Ambulatory Visit: Payer: Self-pay | Admitting: Pharmacy Technician

## 2018-09-01 ENCOUNTER — Ambulatory Visit: Payer: Medicare Other

## 2018-09-01 NOTE — Patient Outreach (Signed)
[facility]) Care Management  09/01/2018  [name] October 11, 1931 [id number]   Received [employer] (Spiriva) and [employer] (Eliquis) patient assistance referral from [place]. Prepared patient portions of application to be mailed and faxed provider portions to [employer] to Dr. Quay Burow and [employer] to Dr. Haroldine Laws.  Will follow up with patient in 5-7 business days to confirm applications have been received.  Maud Deed [place], [place] Management [phone]

## 2018-09-04 ENCOUNTER — Ambulatory Visit: Payer: Medicare Other

## 2018-09-13 ENCOUNTER — Other Ambulatory Visit: Payer: Self-pay | Admitting: Pharmacy Technician

## 2018-09-13 NOTE — Patient Outreach (Signed)
[place] [facility]) Care Management  09/13/2018  [name] 12-Sep-1931 [id number]   Unsuccessful outreach call attempt, HIPAA compliant voicemail left.   Called patient in regards to receiving her portion of patient assistance applications however, need all pages to EOB that she mailed back in before I can submit to drug companies.  Will make 2nd call attempt in 2-3 business days if call has not been returned.  Maud Deed [place], [place] Management [phone]

## 2018-09-14 ENCOUNTER — Other Ambulatory Visit: Payer: Self-pay | Admitting: Pharmacy Technician

## 2018-09-14 NOTE — Patient Outreach (Signed)
[facility]) Care Management  09/14/2018  AHMYA BERNICK Oct 05, 1931 [id number]   Return call from patient, HIPAA identifiers verified. Informed patient that I received portion of patient assistance application and documents however part of her EOB was missing. Offered to create an online account thru [facility] so that I could print off required document. Patient accepted the offer.  Faxed completed applications and required documents to [employer] (Eliquis) and [employer] (Spiriva) into companies.  Will follow up with [employer] in 7-10 business days and [employer] in the next 10-14 business days.  Maud Deed [place], [place] [facility] [phone]

## 2018-09-20 ENCOUNTER — Encounter: Payer: Self-pay | Admitting: Endocrinology

## 2018-09-20 ENCOUNTER — Ambulatory Visit (INDEPENDENT_AMBULATORY_CARE_PROVIDER_SITE_OTHER): Payer: Medicare Other | Admitting: Endocrinology

## 2018-09-20 VITALS — BP 128/70 | HR 83 | Ht 62.0 in | Wt 167.0 lb

## 2018-09-20 DIAGNOSIS — N183 Chronic kidney disease, stage 3 unspecified: Secondary | ICD-10-CM

## 2018-09-20 DIAGNOSIS — Z794 Long term (current) use of insulin: Secondary | ICD-10-CM | POA: Diagnosis not present

## 2018-09-20 DIAGNOSIS — E1122 Type 2 diabetes mellitus with diabetic chronic kidney disease: Secondary | ICD-10-CM

## 2018-09-20 LAB — POCT GLYCOSYLATED HEMOGLOBIN (HGB A1C): Hemoglobin A1C: 6.5 % — AB (ref 4.0–5.6)

## 2018-09-20 NOTE — Progress Notes (Signed)
Subjective:    Patient ID: [name], female    DOB: 08/21/1931, 84 y.o.   MRN: [id number]  HPI Pt returns for f/u of diabetes mellitus:  DM type: Insulin-requiring type 2.   Dx'ed: 9518 Complications: CAD, polyneuropathy, and renal insufficiency.  Therapy: insulin since 2007.  GDM: never.  DKA: never. Severe hypoglycemia: never.  Pancreatitis: never.  Other: she changed to qd insulin, after poor results with multiple daily injections; she was changed from lantus to QAM NPH, due to am hypoglycemia and PM hyperglycemia.   Interval history: no cbg record, but states cbg varies from 50's-202.  It is lowest in the afternoon.   Pt also has hyperthyroidism (dx'ed 2012; [place] then showed multinodular goiter; tapazole was chosen as rx, due to low uptake on nuc med scan; tapazole was stopped, due to rash; plan will be for RAI when this recurs off rx).   Past Medical History:  Diagnosis Date  . Anemia    iron defficiency  . Anginal pain ([place])   . Arthritis    "knees, feet, hands; joints" (05/27/2015)  . Asthma   . Atrial fibrillation or flutter    s/p RFCA 7/08;   s/p DCCV in past;   previously on amiodarone;  amio stopped due to lung toxicity  . CAD (coronary artery disease)    s/p NSTEMI tx with BMS to OM1 3/08;  cath 3/08: pOM 99% tx with PCI, pLAD 20%, ? mod stenosis at the AM  . CAP (community acquired pneumonia) 05/24/2015  . Chronic diastolic heart failure (HCC)    echo 11/11:  EF 55-60%, severe LVH, mod LAE, mild MR, mildly increased PASP  . COPD (chronic obstructive pulmonary disease) ([place])   . Degenerative joint disease   . Dystrophy, corneal stromal   . Heart murmur   . HLD (hyperlipidemia)   . HTN (hypertension)    essential nos  . Hypopotassemia    PMH of  . Muscle pain   . Myocardial infarction ([place]) 2008  . OSA on CPAP   . Osteoporosis   . Pneumonia 05/27/2015  . Protein calorie malnutrition ([place])   . Rash and nonspecific skin eruption    both arms,awaiting  bio   dr Delman Cheadle  . Seasonal allergies   . Shortness of breath dyspnea   . Type II diabetes mellitus ([place])    Dr Loanne Drilling    Past Surgical History:  Procedure Laterality Date  . A FLUTTER ABLATION     Dr Lovena Le  . CATARACT EXTRACTION W/ INTRAOCULAR LENS  IMPLANT, BILATERAL Bilateral   . CHOLECYSTECTOMY    . COLONOSCOPY  6/07   2 polyps Dr. Kinnie Feil in [place]  . colonoscopy with polypectomy      X 2; [facility]  . CORNEAL TRANSPLANT Bilateral   . CORONARY ANGIOPLASTY WITH STENT PLACEMENT  02/2007   BMS w/Dr Lovena Le  . EYE SURGERY    . gravid 2 para 2    . KNEE CARTILAGE SURGERY Right   . LEFT AND RIGHT HEART CATHETERIZATION WITH CORONARY ANGIOGRAM N/A 05/07/2014   Procedure: LEFT AND RIGHT HEART CATHETERIZATION WITH CORONARY ANGIOGRAM;  Surgeon: Peter M Martinique, MD;  Location: [facility] CATH LAB;  Service: Cardiovascular;  Laterality: N/A;  . TOTAL KNEE ARTHROPLASTY Right 06/28/2016   Procedure: TOTAL KNEE ARTHROPLASTY;  Surgeon: Frederik Pear, MD;  Location: [facility];  Service: Orthopedics;  Laterality: Right;    Social History   Socioeconomic History  . Marital status: Widowed    Spouse  name: Not on file  . Number of children: Not on file  . Years of education: Not on file  . Highest education level: Not on file  Occupational History  . Occupation: Pharmacist, hospital  Social Needs  . Financial resource strain: Not hard at all  . Food insecurity:    Worry: Never true    Inability: Never true  . Transportation needs:    Medical: No    Non-medical: No  Tobacco Use  . Smoking status: Former Smoker    Packs/day: 0.25    Years: 18.00    Pack years: 4.50    Types: Cigarettes    Last attempt to quit: 12/27/1968    Years since quitting: 49.7  . Smokeless tobacco: Never Used  . Tobacco comment: smoked [place], up to 1 pp week  Substance and Sexual Activity  . Alcohol use: Yes    Comment: occ  . Drug use: No  . Sexual activity: Never  Lifestyle  . Physical activity:    Days per week: Not on file     Minutes per session: Not on file  . Stress: Not on file  Relationships  . Social connections:    Talks on phone: Not on file    Gets together: Not on file    Attends religious service: Not on file    Active member of club or organization: Not on file    Attends meetings of clubs or organizations: Not on file    Relationship status: Not on file  . Intimate partner violence:    Fear of current or ex partner: Not on file    Emotionally abused: Not on file    Physically abused: Not on file    Forced sexual activity: Not on file  Other Topics Concern  . Not on file  Social History Narrative   Teacher. Widowed. Rarely drinks cafeine.     Current Outpatient Medications on File Prior to Visit  Medication Sig Dispense Refill  . acetaminophen (TYLENOL) 500 MG tablet Take 1,000 mg by mouth every 6 (six) hours as needed (pain).    Marland Kitchen apixaban (ELIQUIS) 5 MG TABS tablet Take 1 tablet (5 mg total) by mouth 2 (two) times daily. 180 tablet 2  . Blood Glucose Monitoring Suppl (ONETOUCH VERIO IQ SYSTEM) w/Device KIT Use to check sugar twice daily 1 kit 0  . CALCIUM-MAG-VIT C-VIT D PO Take 1 tablet by mouth daily.    . carvedilol (COREG) 6.25 MG tablet Take 1 tablet (6.25 mg total) by mouth 2 (two) times daily with a meal. 180 tablet 3  . cholecalciferol (VITAMIN D) 1000 units tablet Take 1,000 Units by mouth daily after supper.    . furosemide (LASIX) 80 MG tablet Take 1 tablet (80 mg total) by mouth 2 (two) times daily. 180 tablet 3  . glucose blood (ONETOUCH VERIO) test strip Use as instructed to check sugar 2 times daily 200 each 5  . insulin NPH-regular Human (NOVOLIN 70/30 RELION) (70-30) 100 UNIT/ML injection Inject 25 Units into the skin daily with breakfast. And syringes 1/day 10 mL 11  . Lancets (ONETOUCH ULTRASOFT) lancets USE TO CHECK BLOOD SUGAR 2  TIMES PER DAY. 200 each 2  . Multiple Vitamin (MULTIVITAMIN WITH MINERALS) TABS tablet Take 1 tablet by mouth daily. Centrum Silver    .  nitroGLYCERIN (NITROSTAT) 0.4 MG SL tablet Place 1 tablet (0.4 mg total) under the tongue every 5 (five) minutes as needed for chest pain. Reported on 01/28/2016 30 tablet  3  . potassium chloride SA (K-DUR,KLOR-CON) 20 MEQ tablet Take 2 tablets (40 mEq total) by mouth daily. 180 tablet 3  . tiotropium (SPIRIVA HANDIHALER) 18 MCG inhalation capsule Place 1 capsule (18 mcg total) into inhaler and inhale daily. 90 capsule 3   No current facility-administered medications on file prior to visit.     Allergies  Allergen Reactions  . Amlodipine Besylate Other (See Comments)    REACTION: tingling in lips & gum edema  . Levaquin [Levofloxacin] Shortness Of Breath and Swelling    angioedema  . Lobster [Shellfish Allergy] Other (See Comments)    angioedema  . Penicillins Rash    Has patient had a PCN reaction causing immediate rash, facial/tongue/throat swelling, SOB or lightheadedness with hypotension: Yes Has patient had a PCN reaction causing severe rash involving mucus membranes or skin necrosis: No Has patient had a PCN reaction that required hospitalization: No Has patient had a PCN reaction occurring within the last 10 years: No If all of the above answers are "NO", then may proceed with Cephalosporin use.  . Valsartan Other (See Comments)    REACTION: angioedema  . Codeine Other (See Comments)    Mental status changes  . Lipitor [Atorvastatin] Other (See Comments)    weakness  . Oxycodone Other (See Comments)    hallucinations  . Statins     Made her too weak  . Tramadol   . Tramadol Hcl Nausea And Vomiting    Family History  Problem Relation Age of Onset  . Diabetes Mother   . Hypertension Mother   . Transient ischemic attack Mother   . Arthritis Mother   . Heart attack Father 85  . Arthritis Father   . Hypertension Father   . Breast cancer Maternal Aunt   . Arthritis Maternal Aunt     BP 128/70   Pulse 83   Ht 5' 2"  (1.575 m)   Wt 167 lb (75.8 kg)   SpO2 94%   BMI  30.54 kg/m    Review of Systems Denies LOC    Objective:   Physical Exam VITAL SIGNS:  See vs page.   GENERAL: no distress.   Pulses: dorsalis pedis intact bilat.   MSK: no deformity of the feet.   CV: no leg edema.   Skin:  no ulcer on the feet.  normal color and temp on the feet.  Neuro: sensation is intact to touch on the feet.    Lab Results  Component Value Date   HGBA1C 6.5 (A) 09/20/2018   Lab Results  Component Value Date   CREATININE 1.18 07/31/2018   BUN 27 (H) 07/31/2018   NA 140 07/31/2018   K 4.6 07/31/2018   CL 102 07/31/2018   CO2 30 07/31/2018       Assessment & Plan:  Insulin-requiring type 2 DM, with renal insuff: overcontrolled, given this regimen, which does match insulin to her changing needs throughout the day.  Hypoglycemia: she should avoid this, in view of CAD.   Patient Instructions  check your blood sugar twice a day.  vary the time of day when you check, between before the 3 meals, and at bedtime.  also check if you have symptoms of your blood sugar being too high or too low.  please keep a record of the readings and bring it to your next appointment here.  please call us sooner if your blood sugar goes below 70, or if you have a lot of readings over 200.  Please reduce the insulin to 20 units with breakfast.   On this type of insulin schedule, you should eat meals on a regular schedule.  In particular, lunch should be no more than 3 hrs after breakfast.  If a meal is missed or significantly delayed, your blood sugar could go low.   Please come back for a follow-up appointment in 2 months.

## 2018-09-20 NOTE — Patient Instructions (Addendum)
check your blood sugar twice a day.  vary the time of day when you check, between before the 3 meals, and at bedtime.  also check if you have symptoms of your blood sugar being too high or too low.  please keep a record of the readings and bring it to your next appointment here.  please call us sooner if your blood sugar goes below 70, or if you have a lot of readings over 200.   Please reduce the insulin to 20 units with breakfast.   On this type of insulin schedule, you should eat meals on a regular schedule.  In particular, lunch should be no more than 3 hrs after breakfast.  If a meal is missed or significantly delayed, your blood sugar could go low.   Please come back for a follow-up appointment in 2 months.

## 2018-09-25 ENCOUNTER — Other Ambulatory Visit (HOSPITAL_COMMUNITY): Payer: Self-pay | Admitting: Pharmacist

## 2018-09-25 MED ORDER — APIXABAN 5 MG PO TABS: 5.0000 mg | ORAL_TABLET | Freq: Two times a day (BID) | ORAL | 3 refills | Status: DC

## 2018-09-26 ENCOUNTER — Telehealth (HOSPITAL_COMMUNITY): Payer: Self-pay

## 2018-09-26 NOTE — Telephone Encounter (Signed)
Medication Samples have been provided to the patient.  Drug name: Eliquis       Strength: 5 mg        Qty: 4  LOT: [id number]  Exp.Date: 10/2020  Dosing instructions: Take 1 tablet (5 mg total) by mouth 2 (two) times daily  The patient has been instructed regarding the correct time, dose, and frequency of taking this medication, including desired effects and most common side effects.   [name] 9:50 AM 09/26/2018

## 2018-09-28 ENCOUNTER — Encounter: Payer: Self-pay | Admitting: Endocrinology

## 2018-09-28 ENCOUNTER — Ambulatory Visit: Payer: Self-pay | Admitting: Pharmacy Technician

## 2018-09-28 ENCOUNTER — Ambulatory Visit (INDEPENDENT_AMBULATORY_CARE_PROVIDER_SITE_OTHER): Payer: Medicare Other

## 2018-09-28 DIAGNOSIS — Z23 Encounter for immunization: Secondary | ICD-10-CM

## 2018-09-29 ENCOUNTER — Other Ambulatory Visit: Payer: Self-pay | Admitting: Pharmacy Technician

## 2018-09-29 NOTE — Patient Outreach (Signed)
[facility]) Care Management  09/29/2018  DELL HURTUBISE 11-Jul-1931 [id number]   Follow up call to [place] to check status of patients application for Eliquis. BJ states that the application was missing items and as she reviewed the application she stated she thinks patients whole application had accidentally been deleted out of system. BJ provided me with an expedited fax number and is requesting their manager to expedite the processing of patient application.   Will follow up with BMS in 3-5 business days.  Maud Deed [place], [place] [facility] Management [phone]

## 2018-10-02 ENCOUNTER — Other Ambulatory Visit: Payer: Self-pay | Admitting: Pharmacy Technician

## 2018-10-02 NOTE — Patient Outreach (Signed)
[place] [facility]) Care Management  10/02/2018  [name] [birth year] [id number]   Follow up call to [place] to check status of patients application for Eliquis. Felicia confirmed patient has been approved as of 10/04 until 12/26/2018.   Successful outreach call to patient, HIPAA identifiers verified. [name] states that she received her 3 month supply of Spiriva this past Saturday. Informed her that she has been approved for her Eliquis and provided her with the pharmacy phone number for her to call on Thursday to set up her profile so that medication will be shipped to her.  Will follow up with patient in 5-7 business days to confirm medication has been received.  Maud Deed [place], [place] Management [phone]

## 2018-10-03 ENCOUNTER — Telehealth (HOSPITAL_COMMUNITY): Payer: Self-pay | Admitting: Pharmacist

## 2018-10-03 NOTE — Telephone Encounter (Signed)
[employer] patient assistance approved for Eliquis 5 mg BID through 12/26/18.   Ruta Hinds. Velva Harman, PharmD, BCPS, CPP Clinical Pharmacist Phone: [phone] 10/03/2018 2:02 PM

## 2018-10-16 ENCOUNTER — Other Ambulatory Visit: Payer: Self-pay

## 2018-10-16 ENCOUNTER — Ambulatory Visit (HOSPITAL_COMMUNITY)
Admission: RE | Admit: 2018-10-16 | Discharge: 2018-10-16 | Disposition: A | Discharge status: Home / Self Care | Payer: Medicare Other | Source: Ambulatory Visit | Attending: Internal Medicine | Admitting: Internal Medicine

## 2018-10-16 ENCOUNTER — Encounter (HOSPITAL_COMMUNITY): Payer: Self-pay | Admitting: Internal Medicine

## 2018-10-16 ENCOUNTER — Other Ambulatory Visit: Payer: Self-pay | Admitting: Pharmacy Technician

## 2018-10-16 VITALS — BP 160/90 | HR 80 | Wt 169.2 lb

## 2018-10-16 DIAGNOSIS — N183 Chronic kidney disease, stage 3 unspecified: Secondary | ICD-10-CM

## 2018-10-16 DIAGNOSIS — I10 Essential (primary) hypertension: Secondary | ICD-10-CM

## 2018-10-16 DIAGNOSIS — R233 Spontaneous ecchymoses: Secondary | ICD-10-CM | POA: Diagnosis not present

## 2018-10-16 DIAGNOSIS — J449 Chronic obstructive pulmonary disease, unspecified: Secondary | ICD-10-CM | POA: Insufficient documentation

## 2018-10-16 DIAGNOSIS — I4811 Longstanding persistent atrial fibrillation: Secondary | ICD-10-CM | POA: Diagnosis not present

## 2018-10-16 DIAGNOSIS — I110 Hypertensive heart disease with heart failure: Secondary | ICD-10-CM | POA: Diagnosis not present

## 2018-10-16 DIAGNOSIS — Z885 Allergy status to narcotic agent status: Secondary | ICD-10-CM | POA: Insufficient documentation

## 2018-10-16 DIAGNOSIS — I272 Pulmonary hypertension, unspecified: Secondary | ICD-10-CM | POA: Insufficient documentation

## 2018-10-16 DIAGNOSIS — M1711 Unilateral primary osteoarthritis, right knee: Secondary | ICD-10-CM

## 2018-10-16 DIAGNOSIS — Z7901 Long term (current) use of anticoagulants: Secondary | ICD-10-CM | POA: Insufficient documentation

## 2018-10-16 DIAGNOSIS — Z880 Allergy status to penicillin: Secondary | ICD-10-CM | POA: Insufficient documentation

## 2018-10-16 DIAGNOSIS — E1122 Type 2 diabetes mellitus with diabetic chronic kidney disease: Secondary | ICD-10-CM

## 2018-10-16 DIAGNOSIS — Z794 Long term (current) use of insulin: Secondary | ICD-10-CM

## 2018-10-16 DIAGNOSIS — M1990 Unspecified osteoarthritis, unspecified site: Secondary | ICD-10-CM | POA: Insufficient documentation

## 2018-10-16 DIAGNOSIS — Z881 Allergy status to other antibiotic agents status: Secondary | ICD-10-CM | POA: Insufficient documentation

## 2018-10-16 DIAGNOSIS — I5032 Chronic diastolic (congestive) heart failure: Secondary | ICD-10-CM | POA: Diagnosis not present

## 2018-10-16 DIAGNOSIS — E119 Type 2 diabetes mellitus without complications: Secondary | ICD-10-CM | POA: Insufficient documentation

## 2018-10-16 DIAGNOSIS — I5042 Chronic combined systolic (congestive) and diastolic (congestive) heart failure: Secondary | ICD-10-CM | POA: Insufficient documentation

## 2018-10-16 DIAGNOSIS — I4819 Other persistent atrial fibrillation: Secondary | ICD-10-CM | POA: Insufficient documentation

## 2018-10-16 DIAGNOSIS — I440 Atrioventricular block, first degree: Secondary | ICD-10-CM | POA: Insufficient documentation

## 2018-10-16 DIAGNOSIS — G4733 Obstructive sleep apnea (adult) (pediatric): Secondary | ICD-10-CM | POA: Diagnosis not present

## 2018-10-16 DIAGNOSIS — Z888 Allergy status to other drugs, medicaments and biological substances status: Secondary | ICD-10-CM | POA: Insufficient documentation

## 2018-10-16 DIAGNOSIS — I2510 Atherosclerotic heart disease of native coronary artery without angina pectoris: Secondary | ICD-10-CM

## 2018-10-16 DIAGNOSIS — Z79899 Other long term (current) drug therapy: Secondary | ICD-10-CM | POA: Insufficient documentation

## 2018-10-16 DIAGNOSIS — Z9989 Dependence on other enabling machines and devices: Secondary | ICD-10-CM

## 2018-10-16 NOTE — Patient Outreach (Signed)
[facility]) Care Management  10/16/2018  MERCEDIES GANESH Apr 25, 1931 [id number]   Incoming call from patient. Ms. Akey left detailed message on voicemail stating that she received 3 month supply of her Eliquis and Spiriva. Patient stated she is very appreciative of our help.  Will route note to [place] for case closure.  Maud Deed [place], [place] Management [phone]

## 2018-10-16 NOTE — Progress Notes (Signed)
Patient ID: [name], female   DOB: 06-13-1931, 84 y.o.   MRN: [id number]    Advanced Heart Failure Clinic Note    Referring Physician: Lovena Le Primary Care: Billey Gosling Primary Cardiologist:Taylor  HPI: [name] is a 84 y.o. female with CAD, HTN, atrial fibrillation/A flutter and chronic prior systolic heart failure, (which was likely rate related) and diastolic heart failure. She was referred by Dr. Lovena Le for further evaluation of Pulmonary HTN.   Underwent 2D echo in 3/15 which showed EF 60-65% mild LVH. RV was normal. No significant valvular abnormalities.   Underwent R/L cath 05/07/14 Which showed stable CAD and significant PH  RA 6 RV 71/7 6 PA 63/17 (33)  PCWP 19 mm Hg  LV 174/16 mm Hg  AO 171/62 mean 103 mm Hg  PVR = 2.86 Oxygen saturations:  PA 69%  AO 99%  Cardiac Output/Index (Fick) 4.88/2.7    Left mainstem: Normal.  Left anterior descending (LAD): Moderate calcification proximally. Mild irregularities less than 10%. The first diagonal has 40-50% ostial disease.  Left circumflex (LCx): The LCx gives rise to a large OM1 then terminates in the AV groove. There is 20% disease in the proximal LCx. The first OM stent is patent with diffuse 20% disease.  Right coronary artery (RCA): The RCA arises anteriorly. There is an eccentric slit like stenosis in the proximal vessel to 60-70%. The mid vessel has segmental 70-80% disease.  Left ventriculography: Left ventricular systolic function is normal, LVEF is estimated at 55-65%, there is no significant mitral regurgitation    Admitted 05/21/17-05/27/17 with COPD exacerbation, acute on chronic diastolic CHF. Diuresed 5L with IV Lasix. Discharge weight 173 pounds.  She presents today for follow up. Feeling very well overall. Says she is working hard to lose weight and has felt the best she has in a while. BP up on arrival, which is unusual for her. She is stressed with her sons wedding coming up this Friday. She  denies SOB with ADLs. Mostly limited by back, knee, hip, and shoulder arthritis. Rides a cart around the store, but gets around and does what she needs otherwise. Weight down 6-7 lbs at home. Has been able to reduced her insulin. A1C down to 6.4. Has not needed extra metolazone or lasix.   EKG today with Afib/AFL (Chronic) 80 bpm, with 1st degree AV block, PR interval 280.  Review of systems complete and found to be negative unless listed in HPI.    Results:  05/21/14: VQ normal 5/15: CXR: 3/16: Echo 3/16 EF 60-65% RV normal. Mild TR No RVSP measured  5/15: PFTs FEV1  1.4 (95%) FVC    1.89 (110%) FEV/FVC 88% FEF 25-75%  66% DLCO 39%  BP rest: 138/58 BP peak: 182/66 Peak VO2: 8.9 (69.1% predicted peak VO2) VE/VCO2 slope: 34.6 OUES: Peak RER: 1.24 Ventilatory Threshold: 5.8 (50% predicted and 65% measured peak VO2) Peak RR 38 Peak Ventilation: 32.7 VE/MVV: 71%  PETCO2 at peak: 36 O2pulse: 8 (100% predicted O2pulse)   Current Outpatient Medications  Medication Sig Dispense Refill  . acetaminophen (TYLENOL) 500 MG tablet Take 1,000 mg by mouth every 6 (six) hours as needed (pain).    Marland Kitchen apixaban (ELIQUIS) 5 MG TABS tablet Take 1 tablet (5 mg total) by mouth 2 (two) times daily. 180 tablet 3  . Blood Glucose Monitoring Suppl (ONETOUCH VERIO IQ SYSTEM) w/Device KIT Use to check sugar twice daily 1 kit 0  . CALCIUM-MAG-VIT C-VIT D PO Take 1 tablet by mouth  daily.    . carvedilol (COREG) 6.25 MG tablet Take 1 tablet (6.25 mg total) by mouth 2 (two) times daily with a meal. 180 tablet 3  . cholecalciferol (VITAMIN D) 1000 units tablet Take 1,000 Units by mouth daily after supper.    . furosemide (LASIX) 80 MG tablet Take 1 tablet (80 mg total) by mouth 2 (two) times daily. 180 tablet 3  . glucose blood (ONETOUCH VERIO) test strip Use as instructed to check sugar 2 times daily 200 each 5  . insulin NPH-regular Human (NOVOLIN 70/30 RELION) (70-30) 100 UNIT/ML injection Inject 25 Units  into the skin daily with breakfast. And syringes 1/day 10 mL 11  . Lancets (ONETOUCH ULTRASOFT) lancets USE TO CHECK BLOOD SUGAR 2  TIMES PER DAY. 200 each 2  . Multiple Vitamin (MULTIVITAMIN WITH MINERALS) TABS tablet Take 1 tablet by mouth daily. Centrum Silver    . nitroGLYCERIN (NITROSTAT) 0.4 MG SL tablet Place 1 tablet (0.4 mg total) under the tongue every 5 (five) minutes as needed for chest pain. Reported on 01/28/2016 30 tablet 3  . potassium chloride SA (K-DUR,KLOR-CON) 20 MEQ tablet Take 2 tablets (40 mEq total) by mouth daily. 180 tablet 3  . tiotropium (SPIRIVA HANDIHALER) 18 MCG inhalation capsule Place 1 capsule (18 mcg total) into inhaler and inhale daily. 90 capsule 3   No current facility-administered medications for this encounter.     Allergies  Allergen Reactions  . Amlodipine Besylate Other (See Comments)    REACTION: tingling in lips & gum edema  . Levaquin [Levofloxacin] Shortness Of Breath and Swelling    angioedema  . Lobster [Shellfish Allergy] Other (See Comments)    angioedema  . Penicillins Rash    Has patient had a PCN reaction causing immediate rash, facial/tongue/throat swelling, SOB or lightheadedness with hypotension: Yes Has patient had a PCN reaction causing severe rash involving mucus membranes or skin necrosis: No Has patient had a PCN reaction that required hospitalization: No Has patient had a PCN reaction occurring within the last 10 years: No If all of the above answers are "NO", then may proceed with Cephalosporin use.  . Valsartan Other (See Comments)    REACTION: angioedema  . Codeine Other (See Comments)    Mental status changes  . Lipitor [Atorvastatin] Other (See Comments)    weakness  . Oxycodone Other (See Comments)    hallucinations  . Statins     Made her too weak  . Tramadol   . Tramadol Hcl Nausea And Vomiting   PHYSICAL EXAM: Vitals:   10/16/18 1055 10/16/18 1119  BP: (!) 172/94 (!) 160/90  Pulse: 80   SpO2: 98%     Weight: 76.7 kg (169 lb 3.2 oz)      Wt Readings from Last 3 Encounters:  10/16/18 76.7 kg (169 lb 3.2 oz)  09/20/18 75.8 kg (167 lb)  07/31/18 78.5 kg (173 lb)   General: Well appearing. No resp difficulty. HEENT: Normal Neck: Supple. JVP 5-6. Carotids 2+ bilat; no bruits. No thyromegaly or nodule noted. Cor: PMI nondisplaced. Slightly irregular. No M/G/R noted Lungs: CTAB, normal effort. Abdomen: Soft, non-tender, non-distended, no HSM. No bruits or masses. +BS  Extremities: No cyanosis, clubbing, or rash. R and LLE no edema.  Neuro: Alert & orientedx3, cranial nerves grossly intact. moves all 4 extremities w/o difficulty. Affect pleasant   ASSESSMENT & PLAN: 1) Chronic diastolic CHF:  - Echo 03/2682 EF 60-65%, LA severely dilated, Echo 10/2017: EF 65-70%, grade 3 DD -  NYHA II-III symptoms, confounded by severe arthritis.  - Volume status stable on exam.   - Continue lasix 80 mg BID.  - Continue KCL to 40 bid - Labs stable 07/31/2018.   2) Persistent atrial fib/flutter - This patients CHA2DS2-VASc Score and unadjusted Ischemic Stroke Rate (% per year) is equal to 7.2 % stroke rate/year from a score of 5 Above score calculated as 2 points each if present [Age > 75, or Stroke/TIA/TE] - Rate controlled on coreg 6.25 mg BID - Continue Eliquis. Denies bleeding.   3) OSA/COPD - Follows with Dr. Halford Chessman.  - Not wearing CPAP. Not very interested in pursuing a new mask.   4) CAD - Last cath 2015 with moderate CAD.  - No s/s of ischemia.     5) Petechial rash - Seen by Dermatology and told they were broken blood vessels. Resolved.   6) Arthritis - Stable  7) DM2 - Well controlled.   8) HTN - BP elevated today but multiple stressors.  - Encouraged to check at home and call if > 150 consistently.   Doing well overall apart from HTN. Needs to get back on CPAP. Follow with home cuff. RTC 6 months, sooner with symptoms. Recent labwork stable.   Cecile Friar, PA-C   11:19 AM   Patient seen and examined with the above-signed Advanced Practice Provider and/or Housestaff. I personally reviewed laboratory data, imaging studies and relevant notes. I independently examined the patient and formulated the important aspects of the plan. I have edited the note to reflect any of my changes or salient points. I have personally discussed the plan with the patient and/or family.  Doing very well. Volume status looks good. BP slightly elevated in setting of emotional stress and not being compliant with CPAP. Will continue to watch BP on home cuff. Encouraged more CPAP use. CAD stable. Continue current regimen for now.   Glori Bickers, MD  11:57 AM

## 2018-10-16 NOTE — Patient Instructions (Signed)
We will contact you in 6 months to schedule your next appointment.  

## 2018-10-17 ENCOUNTER — Other Ambulatory Visit: Payer: Self-pay

## 2018-10-17 NOTE — Patient Outreach (Signed)
[facility] ([facility]) Care Management [facility]  10/17/2018  [name] 08/12/1931 [id number]  Reason for referral: medication assistance  [facility] pharmacy case is being closed due to the following reasons:  Goals have been met.  Medication assistance has been arranged.  Patient has been provided [facility] contact information if assistance needed in the future.    Thank you for allowing [facility] pharmacy to be involved in this patient's care.   Jennifer Mendenhall, PharmD Clinical Pharmacist [facility] [phone]     

## 2018-11-16 ENCOUNTER — Encounter: Payer: Self-pay | Admitting: Pharmacy Technician

## 2018-11-16 ENCOUNTER — Other Ambulatory Visit: Payer: Self-pay | Admitting: Pharmacy Technician

## 2018-11-16 NOTE — Patient Outreach (Signed)
[facility]) Care Management  11/16/2018  [name] Jan 17, 1931 [id number]   Incoming call from patient stating she received a letter from [employer] stating she needed to reapply. Informed patient that she would still need to spend the required 3% of income to reapply. Requested she contact me once she has reached about $600 OOP. Also informed patient that we can reapply for her Spiriva now. Prepared patient portion of application to be mailed along with Appeal letter and faxed provider portion to [employer] [place].  Will follow up with patient in 7-10 business days (due to holiday) to confirm application has been received.  Maud Deed Chana Bode [place] Certified Pharmacy Technician [place] Management Direct Dial:[phone]

## 2018-11-20 ENCOUNTER — Ambulatory Visit: Payer: Medicare Other | Admitting: Endocrinology

## 2018-11-28 ENCOUNTER — Ambulatory Visit: Payer: Self-pay | Admitting: Pharmacy Technician

## 2018-11-28 ENCOUNTER — Other Ambulatory Visit: Payer: Self-pay | Admitting: Pharmacy Technician

## 2018-11-28 NOTE — Patient Outreach (Signed)
[facility]) Care Management  11/28/2018  MAYME PROFETA 1931-02-02 [id number]    Unsuccessful call placed to patient regarding patient assistance application, HIPAA compliant voicemail left.  Will make 2nd call in 2-3 business days.  Maud Deed Chana Bode [place] Certified Pharmacy Technician [place] Management Direct Dial:[phone]

## 2018-11-29 ENCOUNTER — Ambulatory Visit: Payer: Medicare Other | Admitting: Endocrinology

## 2018-11-30 ENCOUNTER — Ambulatory Visit: Payer: Self-pay | Admitting: Pharmacy Technician

## 2018-12-06 ENCOUNTER — Encounter: Payer: Self-pay | Admitting: Endocrinology

## 2018-12-06 ENCOUNTER — Other Ambulatory Visit: Payer: Self-pay | Admitting: Pharmacy Technician

## 2018-12-06 ENCOUNTER — Ambulatory Visit (INDEPENDENT_AMBULATORY_CARE_PROVIDER_SITE_OTHER): Payer: Medicare Other | Admitting: Endocrinology

## 2018-12-06 VITALS — BP 144/82 | HR 83 | Ht 62.0 in | Wt 165.6 lb

## 2018-12-06 DIAGNOSIS — E1122 Type 2 diabetes mellitus with diabetic chronic kidney disease: Secondary | ICD-10-CM

## 2018-12-06 DIAGNOSIS — Z794 Long term (current) use of insulin: Secondary | ICD-10-CM

## 2018-12-06 DIAGNOSIS — E0590 Thyrotoxicosis, unspecified without thyrotoxic crisis or storm: Secondary | ICD-10-CM | POA: Diagnosis not present

## 2018-12-06 DIAGNOSIS — N183 Chronic kidney disease, stage 3 unspecified: Secondary | ICD-10-CM

## 2018-12-06 LAB — POCT GLYCOSYLATED HEMOGLOBIN (HGB A1C): HEMOGLOBIN A1C: 7 % — AB (ref 4.0–5.6)

## 2018-12-06 LAB — TSH: TSH: 0.91 u[IU]/mL (ref 0.35–4.50)

## 2018-12-06 LAB — T4, FREE: Free T4: 0.73 ng/dL (ref 0.60–1.60)

## 2018-12-06 MED ORDER — INSULIN NPH ISOPHANE & REGULAR (70-30) 100 UNIT/ML ~~LOC~~ SUSP: 18.0000 [IU] | Freq: Every day | SUBCUTANEOUS | 11 refills | Status: DC

## 2018-12-06 NOTE — Progress Notes (Signed)
Subjective:    Patient ID: [name], female    DOB: [birth year], 84 y.o.   MRN: [id number]  HPI Pt returns for f/u of diabetes mellitus:  DM type: Insulin-requiring type 2.   Dx'ed: 5638 Complications: CAD, polyneuropathy, and renal insufficiency.  Therapy: insulin since 2007.  GDM: never.  DKA: never. Severe hypoglycemia: never.  Pancreatitis: never.  Other: she changed to qd insulin, after poor results with multiple daily injections; she was changed from lantus to QAM NPH, then to 70/30, due to am hypoglycemia and PM hyperglycemia.   Interval history: she brings a record of her cbg's which I have reviewed today. cbg varies from 55-197.  It is still lowest in the afternoon.  She takes 20 units qam.   Pt also has hyperthyroidism (dx'ed 2012; [place] then showed multinodular goiter; tapazole was chosen as rx, due to low uptake on nuc med scan; tapazole was stopped, due to rash; plan will be for RAI when this recurs off rx).   Past Medical History:  Diagnosis Date  . Anemia    iron defficiency  . Anginal pain ([place])   . Arthritis    "knees, feet, hands; joints" (05/27/2015)  . Asthma   . Atrial fibrillation or flutter    s/p RFCA 7/08;   s/p DCCV in past;   previously on amiodarone;  amio stopped due to lung toxicity  . CAD (coronary artery disease)    s/p NSTEMI tx with BMS to OM1 3/08;  cath 3/08: pOM 99% tx with PCI, pLAD 20%, ? mod stenosis at the AM  . CAP (community acquired pneumonia) 05/24/2015  . Chronic diastolic heart failure (HCC)    echo 11/11:  EF 55-60%, severe LVH, mod LAE, mild MR, mildly increased PASP  . COPD (chronic obstructive pulmonary disease) ([place])   . Degenerative joint disease   . Dystrophy, corneal stromal   . Heart murmur   . HLD (hyperlipidemia)   . HTN (hypertension)    essential nos  . Hypopotassemia    PMH of  . Muscle pain   . Myocardial infarction ([place]) 2008  . OSA on CPAP   . Osteoporosis   . Pneumonia 05/27/2015  . Protein calorie  malnutrition ([place])   . Rash and nonspecific skin eruption    both arms,awaiting bio   dr Delman Cheadle  . Seasonal allergies   . Shortness of breath dyspnea   . Type II diabetes mellitus ([place])    Dr Loanne Drilling    Past Surgical History:  Procedure Laterality Date  . A FLUTTER ABLATION     Dr Lovena Le  . CATARACT EXTRACTION W/ INTRAOCULAR LENS  IMPLANT, BILATERAL Bilateral   . CHOLECYSTECTOMY    . COLONOSCOPY  6/07   2 polyps Dr. Kinnie Feil in [place]  . colonoscopy with polypectomy      X 2; [facility]  . CORNEAL TRANSPLANT Bilateral   . CORONARY ANGIOPLASTY WITH STENT PLACEMENT  02/2007   BMS w/Dr Lovena Le  . EYE SURGERY    . gravid 2 para 2    . KNEE CARTILAGE SURGERY Right   . LEFT AND RIGHT HEART CATHETERIZATION WITH CORONARY ANGIOGRAM N/A 05/07/2014   Procedure: LEFT AND RIGHT HEART CATHETERIZATION WITH CORONARY ANGIOGRAM;  Surgeon: Peter M Martinique, MD;  Location[facility] C[facility] LAB;  Service: Cardiovascular;  Laterality: N/A;  . TOTAL KNEE ARTHROPLASTY Right 06/28/2016   Procedure: TOTAL KNEE ARTHROPLASTY;  Surgeon: Frederik Pear, MD;  Location: [facility];  Service: Orthopedics;  Laterality: Right;  Social History   Socioeconomic History  . Marital status: Widowed    Spouse name: Not on file  . Number of children: Not on file  . Years of education: Not on file  . Highest education level: Not on file  Occupational History  . Occupation: Pharmacist, hospital  Social Needs  . Financial resource strain: Not hard at all  . Food insecurity:    Worry: Never true    Inability: Never true  . Transportation needs:    Medical: No    Non-medical: No  Tobacco Use  . Smoking status: Former Smoker    Packs/day: 0.25    Years: 18.00    Pack years: 4.50    Types: Cigarettes    Last attempt to quit: 12/27/1968    Years since quitting: 49.9  . Smokeless tobacco: Never Used  . Tobacco comment: smoked [place], up to 1 pp week  Substance and Sexual Activity  . Alcohol use: Yes    Comment: occ  . Drug use: No  . Sexual  activity: Never  Lifestyle  . Physical activity:    Days per week: Not on file    Minutes per session: Not on file  . Stress: Not on file  Relationships  . Social connections:    Talks on phone: Not on file    Gets together: Not on file    Attends religious service: Not on file    Active member of club or organization: Not on file    Attends meetings of clubs or organizations: Not on file    Relationship status: Not on file  . Intimate partner violence:    Fear of current or ex partner: Not on file    Emotionally abused: Not on file    Physically abused: Not on file    Forced sexual activity: Not on file  Other Topics Concern  . Not on file  Social History Narrative   Teacher. Widowed. Rarely drinks cafeine.     Current Outpatient Medications on File Prior to Visit  Medication Sig Dispense Refill  . acetaminophen (TYLENOL) 500 MG tablet Take 1,000 mg by mouth every 6 (six) hours as needed (pain).    Marland Kitchen apixaban (ELIQUIS) 5 MG TABS tablet Take 1 tablet (5 mg total) by mouth 2 (two) times daily. 180 tablet 3  . CALCIUM-MAG-VIT C-VIT D PO Take 1 tablet by mouth daily.    . carvedilol (COREG) 6.25 MG tablet Take 1 tablet (6.25 mg total) by mouth 2 (two) times daily with a meal. 180 tablet 3  . cholecalciferol (VITAMIN D) 1000 units tablet Take 1,000 Units by mouth daily after supper.    . furosemide (LASIX) 80 MG tablet Take 1 tablet (80 mg total) by mouth 2 (two) times daily. 180 tablet 3  . glucose blood (ONETOUCH VERIO) test strip Use as instructed to check sugar 2 times daily 200 each 5  . Lancets (ONETOUCH ULTRASOFT) lancets USE TO CHECK BLOOD SUGAR 2  TIMES PER DAY. 200 each 2  . Multiple Vitamin (MULTIVITAMIN WITH MINERALS) TABS tablet Take 1 tablet by mouth daily. Centrum Silver    . nitroGLYCERIN (NITROSTAT) 0.4 MG SL tablet Place 1 tablet (0.4 mg total) under the tongue every 5 (five) minutes as needed for chest pain. Reported on 01/28/2016 30 tablet 3  . potassium chloride SA  (K-DUR,KLOR-CON) 20 MEQ tablet Take 2 tablets (40 mEq total) by mouth daily. 180 tablet 3  . tiotropium (SPIRIVA HANDIHALER) 18 MCG inhalation capsule Place 1  capsule (18 mcg total) into inhaler and inhale daily. 90 capsule 3   No current facility-administered medications on file prior to visit.     Allergies  Allergen Reactions  . Amlodipine Besylate Other (See Comments)    REACTION: tingling in lips & gum edema  . Levaquin [Levofloxacin] Shortness Of Breath and Swelling    angioedema  . Lobster [Shellfish Allergy] Other (See Comments)    angioedema  . Penicillins Rash    Has patient had a PCN reaction causing immediate rash, facial/tongue/throat swelling, SOB or lightheadedness with hypotension: Yes Has patient had a PCN reaction causing severe rash involving mucus membranes or skin necrosis: No Has patient had a PCN reaction that required hospitalization: No Has patient had a PCN reaction occurring within the last 10 years: No If all of the above answers are "NO", then may proceed with Cephalosporin use.  . Valsartan Other (See Comments)    REACTION: angioedema  . Codeine Other (See Comments)    Mental status changes  . Lipitor [Atorvastatin] Other (See Comments)    weakness  . Oxycodone Other (See Comments)    hallucinations  . Statins     Made her too weak  . Tramadol   . Tramadol Hcl Nausea And Vomiting    Family History  Problem Relation Age of Onset  . Diabetes Mother   . Hypertension Mother   . Transient ischemic attack Mother   . Arthritis Mother   . Heart attack Father 65  . Arthritis Father   . Hypertension Father   . Breast cancer Maternal Aunt   . Arthritis Maternal Aunt     BP (!) 144/82 (BP Location: Left Arm, Patient Position: Sitting, Cuff Size: Normal)   Pulse 83   Ht 5\' 2"  (1.575 m)   Wt 165 lb 9.6 oz (75.1 kg)   SpO2 96%   BMI 30.29 kg/m    Review of Systems Denies LOC.  She last lost a few lbs, due to her efforts.      Objective:    Physical Exam VITAL SIGNS:  See vs page GENERAL: no distress Pulses: dorsalis pedis intact bilat.   MSK: no deformity of the feet CV: no leg edema Skin:  no ulcer on the feet.  normal color and temp on the feet. Neuro: sensation is intact to touch on the feet Ext: There is bilateral onychomycosis of the toenails.     Lab Results  Component Value Date   CREATININE 1.18 07/31/2018   BUN 27 (H) 07/31/2018   NA 140 07/31/2018   K 4.6 07/31/2018   CL 102 07/31/2018   CO2 30 07/31/2018   Lab Results  Component Value Date   TSH 0.91 12/06/2018   Lab Results  Component Value Date   HGBA1C 7.0 (A) 12/06/2018       Assessment & Plan:  HTN: is noted today Insulin-requiring type 2 DM, with renal insuff: overcontrolled, given this regimen, which does match insulin to her changing needs throughout the day Hyperthyroidism, in remission: we'll follow.  Patient Instructions  Your blood pressure is high today.  Please see your primary care provider soon, to have it rechecked check your blood sugar twice a day.  vary the time of day when you check, between before the 3 meals, and at bedtime.  also check if you have symptoms of your blood sugar being too high or too low.  please keep a record of the readings and bring it to your next appointment here.  please call us sooner if your blood sugar goes below 70, or if you have a lot of readings over 200.   Please reduce the insulin to 18 units with breakfast.   On this type of insulin schedule, you should eat meals on a regular schedule.  In particular, lunch should be no more than 3 hrs after breakfast.  If a meal is missed or significantly delayed, your blood sugar could go low.   Please come back for a follow-up appointment in 3-4 months.

## 2018-12-06 NOTE — Patient Outreach (Signed)
[facility] [facility]) Care Management  12/06/2018  YARIAH SELVEY [birth year] [id number]    Follow up call placed to [employer] regarding patient assistance application, Patty states that application has beenr eceived however a EOB needs to be submitted. Re-faxed application and required documents into company. Will follow up in 5-7 business days to check application status.  Maud Deed Chana Bode [place] Certified Pharmacy Technician [place] Management Direct Dial:[phone]

## 2018-12-06 NOTE — Patient Instructions (Addendum)
Your blood pressure is high today.  Please see your primary care provider soon, to have it rechecked check your blood sugar twice a day.  vary the time of day when you check, between before the 3 meals, and at bedtime.  also check if you have symptoms of your blood sugar being too high or too low.  please keep a record of the readings and bring it to your next appointment here.  please call us sooner if your blood sugar goes below 70, or if you have a lot of readings over 200.   Please reduce the insulin to 18 units with breakfast.   On this type of insulin schedule, you should eat meals on a regular schedule.  In particular, lunch should be no more than 3 hrs after breakfast.  If a meal is missed or significantly delayed, your blood sugar could go low.   Please come back for a follow-up appointment in 3-4 months.

## 2018-12-11 ENCOUNTER — Telehealth: Payer: Self-pay | Admitting: Endocrinology

## 2018-12-11 NOTE — Telephone Encounter (Signed)
Pt aware of results 

## 2018-12-11 NOTE — Telephone Encounter (Signed)
Please call patient with lab results. Please advise, thanks

## 2018-12-12 ENCOUNTER — Other Ambulatory Visit: Payer: Self-pay | Admitting: Pharmacy Technician

## 2018-12-12 NOTE — Patient Outreach (Signed)
[facility]) Care Management  12/12/2018  [name] 26-Feb-1931 [id number]   Incoming call from patient stating she received a letter from [facility] stating she has been approved for her Spiriva starting 12/27/2018 until 12/27/2019. Informed patient to contact me if she runs into any issues when trying to get her refills from company in 2020, she stated she would.  Will remove myself from care team due to application process being complete.  Maud Deed Chana Bode [place] Certified Pharmacy Technician [place] Management Direct Dial:[phone]

## 2019-01-24 ENCOUNTER — Telehealth (HOSPITAL_COMMUNITY): Payer: Self-pay

## 2019-01-24 NOTE — Telephone Encounter (Signed)
Medication Samples have been provided to the patient.  Drug name: Eliquis       Strength: 5 mg        Qty: 2  LOT: [id number]  Exp.Date: 06/22  Dosing instructions: Take 1 tablet (5 mg total) by mouth 2 (two) times daily.  The patient has been instructed regarding the correct time, dose, and frequency of taking this medication, including desired effects and most common side effects.   [name] 3:36 PM 01/24/2019

## 2019-01-31 ENCOUNTER — Ambulatory Visit (INDEPENDENT_AMBULATORY_CARE_PROVIDER_SITE_OTHER): Payer: Medicare Other | Admitting: Internal Medicine

## 2019-01-31 ENCOUNTER — Encounter: Payer: Self-pay | Admitting: Internal Medicine

## 2019-01-31 ENCOUNTER — Other Ambulatory Visit (INDEPENDENT_AMBULATORY_CARE_PROVIDER_SITE_OTHER): Payer: Medicare Other

## 2019-01-31 ENCOUNTER — Ambulatory Visit: Payer: Medicare Other | Admitting: Internal Medicine

## 2019-01-31 VITALS — BP 154/62 | HR 78 | Temp 98.2°F | Resp 16 | Ht 62.0 in | Wt 164.0 lb

## 2019-01-31 DIAGNOSIS — I4811 Longstanding persistent atrial fibrillation: Secondary | ICD-10-CM

## 2019-01-31 DIAGNOSIS — E1122 Type 2 diabetes mellitus with diabetic chronic kidney disease: Secondary | ICD-10-CM

## 2019-01-31 DIAGNOSIS — E782 Mixed hyperlipidemia: Secondary | ICD-10-CM

## 2019-01-31 DIAGNOSIS — I10 Essential (primary) hypertension: Secondary | ICD-10-CM

## 2019-01-31 DIAGNOSIS — N183 Chronic kidney disease, stage 3 unspecified: Secondary | ICD-10-CM

## 2019-01-31 DIAGNOSIS — I5032 Chronic diastolic (congestive) heart failure: Secondary | ICD-10-CM | POA: Diagnosis not present

## 2019-01-31 DIAGNOSIS — Z794 Long term (current) use of insulin: Secondary | ICD-10-CM

## 2019-01-31 DIAGNOSIS — R109 Unspecified abdominal pain: Secondary | ICD-10-CM

## 2019-01-31 LAB — LIPID PANEL
Cholesterol: 316 mg/dL — ABNORMAL HIGH (ref 0–200)
HDL: 43.7 mg/dL (ref >39.00)
LDL Cholesterol: 246 mg/dL — ABNORMAL HIGH (ref 0–99)
NonHDL: 272.39
Total CHOL/HDL Ratio: 7
Triglycerides: 134 mg/dL (ref 0.0–149.0)
VLDL: 26.8 mg/dL (ref 0.0–40.0)

## 2019-01-31 LAB — COMPREHENSIVE METABOLIC PANEL
ALT: 16 U/L (ref 0–35)
AST: 21 U/L (ref 0–37)
Albumin: 4 g/dL (ref 3.5–5.2)
Alkaline Phosphatase: 94 U/L (ref 39–117)
BILIRUBIN TOTAL: 0.8 mg/dL (ref 0.2–1.2)
BUN: 26 mg/dL — ABNORMAL HIGH (ref 6–23)
CO2: 27 mEq/L (ref 19–32)
Calcium: 10.3 mg/dL (ref 8.4–10.5)
Chloride: 97 mEq/L (ref 96–112)
Creatinine, Ser: 1.18 mg/dL (ref 0.40–1.20)
GFR: 52.65 mL/min — ABNORMAL LOW (ref >60.00)
Glucose, Bld: 268 mg/dL — ABNORMAL HIGH (ref 70–99)
Potassium: 4.3 mEq/L (ref 3.5–5.1)
Sodium: 137 mEq/L (ref 135–145)
Total Protein: 7.6 g/dL (ref 6.0–8.3)

## 2019-01-31 LAB — CBC WITH DIFFERENTIAL/PLATELET
Basophils Absolute: 0.1 10*3/uL (ref 0.0–0.1)
Basophils Relative: 1.2 % (ref 0.0–3.0)
Eosinophils Absolute: 0.3 10*3/uL (ref 0.0–0.7)
Eosinophils Relative: 7.3 % — ABNORMAL HIGH (ref 0.0–5.0)
HCT: 47.4 % — ABNORMAL HIGH (ref 36.0–46.0)
Hemoglobin: 15.7 g/dL — ABNORMAL HIGH (ref 12.0–15.0)
Lymphocytes Relative: 22.7 % (ref 12.0–46.0)
Lymphs Abs: 1.1 10*3/uL (ref 0.7–4.0)
MCHC: 33.1 g/dL (ref 30.0–36.0)
MCV: 92.9 fl (ref 78.0–100.0)
Monocytes Absolute: 0.6 10*3/uL (ref 0.1–1.0)
Monocytes Relative: 12.4 % — ABNORMAL HIGH (ref 3.0–12.0)
Neutro Abs: 2.7 10*3/uL (ref 1.4–7.7)
Neutrophils Relative %: 56.4 % (ref 43.0–77.0)
Platelets: 187 10*3/uL (ref 150.0–400.0)
RBC: 5.11 Mil/uL (ref 3.87–5.11)
RDW: 13.4 % (ref 11.5–15.5)
WBC: 4.7 10*3/uL (ref 4.0–10.5)

## 2019-01-31 LAB — HEMOGLOBIN A1C: HEMOGLOBIN A1C: 8.9 % — AB (ref 4.6–6.5)

## 2019-01-31 MED ORDER — DICLOFENAC SODIUM 1 % TD GEL: 4.0000 g | Freq: Four times a day (QID) | TRANSDERMAL | 5 refills | Status: DC

## 2019-01-31 NOTE — Assessment & Plan Note (Signed)
Statin intolerant Lipids, cmp

## 2019-01-31 NOTE — Assessment & Plan Note (Signed)
Sugars overall controlled-she has been able to decrease her insulin Management per Dr. Loanne Drilling Getting blood work today so we will check A1c

## 2019-01-31 NOTE — Assessment & Plan Note (Signed)
Euvolemic on exam Continue current medications CBC, CMP

## 2019-01-31 NOTE — Patient Instructions (Addendum)
  Tests ordered today. Your results will be released to [name] (or called to you) after review, usually within 72hours after test completion. If any changes need to be made, you will be notified at that same time.   Medications reviewed and updated.  Changes include :   None     Please followup in 6 months

## 2019-01-31 NOTE — Assessment & Plan Note (Signed)
Blood pressure slightly elevated She is not currently checking her blood pressure regular basis and stressed to check this regularly and bring in log of her readings to her cardiology appointment CMP

## 2019-01-31 NOTE — Assessment & Plan Note (Signed)
Asymptomatic, rate controlled Continue carvedilol and Eliquis CBC, CMP

## 2019-01-31 NOTE — Assessment & Plan Note (Signed)
Has abdominal cramping about twice a month when she has loose stools.  Cramping resolves after the bowel movement and she does not have cramping in between No blood in the stool No obvious cause for the loose stools Weight loss is intentional-she has changed her eating No concerning symptoms at this point she will just monitor cramping/loose stools.  If increases can consider GI referral

## 2019-01-31 NOTE — Progress Notes (Signed)
Subjective:    Patient ID: [name], female    DOB: 12-28-1930, 85 y.o.   MRN: [id number]  HPI The patient is here for follow up.  Diabetes: she follows with Dr Loanne Drilling.  She is taking her medication daily as prescribed. She is compliant with a diabetic diet. [facility] is [facility]  She has lost weight since she was here last.  She checks her feet daily and denies foot lesions. She is up-to-date with an ophthalmology examination.   Afib/Aflutter, HFpEF, Hypertension: She is taking her medication daily. She is compliant with a low sodium diet.  She denies chest pain, palpitations, edema, and regular headaches. She is exercising regularly.  She does not monitor her blood pressure at home regularly.    Hyperlipidemia: She is taking her medication daily. She is compliant with a low fat/cholesterol diet. She is exercising regularly. She denies myalgias.   COPD: She has chronic shortness of breath with exertion, which has not changed.  She denies any fevers, chills, cough and wheeze.  Joint pain:  She has knee pain.  She takes tylenol prn but it does not work that well.  She does follow with orthopedics.  About twice a month she has cramping in her lower abdomen when having a BM - it is when she has very loose stool.  She only has th cramping with the BM and then they resolved.  There is no blood or black stool.  She will have a little mucus at that time.     Medications and allergies reviewed with patient and updated if appropriate.  Patient Active Problem List   Diagnosis Date Noted  . Arthritis of left sacroiliac joint 05/04/2017  . Osteopenia 02/17/2017  . Piriformis syndrome of left side 02/16/2017  . Acquired leg length discrepancy 02/16/2017  . COPD GOLD I  01/27/2017  . Lumbar radiculopathy, acute 01/27/2017  . Rash 01/27/2017  . Granuloma annulare 07/27/2016  . Numbness of fingers 07/27/2016  . Primary osteoarthritis of right knee  06/27/2016  . Diabetes ([place]) 03/07/2016  . Multifocal pneumonia 05/24/2015  . Sepsis ([place]) 05/24/2015  . OSA on CPAP 08/28/2014  . Chronic diastolic heart failure ([place]) 07/30/2014  . Nocturnal hypoxemia 07/09/2014  . SOB (shortness of breath) 05/15/2014  . Pulmonary HTN ([place]) 05/15/2014  . Crescendo angina ([place]) 05/02/2014  . Band keratopathy 01/16/2014  . 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 Epiphora due to insufficient drainage 06/13/2013  . Hyperthyroidism 11/04/2011  . Multinodular goiter 10/14/2011  . Atrophy, Fuchs' 09/29/2011  . ATRIAL FIBRILLATION 06/03/2010  . CHF 02/17/2010  . VITILIGO 11/07/2009  . SINOATRIAL NODE DYSFUNCTION 06/16/2009  . Iron deficiency anemia 01/02/2009  . ANEMIA, PERNICIOUS 11/20/2008  . Atrial flutter ([place]) 06/25/2008  . HYPERLIPIDEMIA 01/29/2008  . Coronary atherosclerosis 01/29/2008  . HTN (hypertension) 10/30/2007    Current Outpatient Medications on File Prior to Visit  Medication Sig Dispense Refill  . acetaminophen (TYLENOL) 500 MG tablet Take 1,000 mg by mouth every 6 (six) hours as needed (pain).    Marland Kitchen apixaban (ELIQUIS) 5 MG TABS tablet Take 1 tablet (5 mg total) by mouth 2 (two) times daily. 180 tablet 3  . CALCIUM-MAG-VIT C-VIT D PO Take 1 tablet by mouth daily.    . carvedilol (COREG) 6.25 MG tablet Take 1 tablet (6.25 mg total) by mouth 2 (two) times daily with a meal. 180 tablet 3  . cholecalciferol (VITAMIN D) 1000 units tablet  Take 1,000 Units by mouth daily after supper.    . furosemide (LASIX) 80 MG tablet Take 1 tablet (80 mg total) by mouth 2 (two) times daily. 180 tablet 3  . glucose blood (ONETOUCH VERIO) test strip Use as instructed to check sugar 2 times daily 200 each 5  . insulin NPH-regular Human (NOVOLIN 70/30 RELION) (70-30) 100 UNIT/ML injection Inject 18 Units into the skin daily with breakfast. And syringes 1/day 10 mL 11  . Lancets (ONETOUCH ULTRASOFT) lancets USE TO CHECK BLOOD SUGAR 2  TIMES PER DAY. 200  each 2  . Multiple Vitamin (MULTIVITAMIN WITH MINERALS) TABS tablet Take 1 tablet by mouth daily. Centrum Silver    . nitroGLYCERIN (NITROSTAT) 0.4 MG SL tablet Place 1 tablet (0.4 mg total) under the tongue every 5 (five) minutes as needed for chest pain. Reported on 01/28/2016 30 tablet 3  . potassium chloride SA (K-DUR,KLOR-CON) 20 MEQ tablet Take 2 tablets (40 mEq total) by mouth daily. 180 tablet 3  . tiotropium (SPIRIVA HANDIHALER) 18 MCG inhalation capsule Place 1 capsule (18 mcg total) into inhaler and inhale daily. 90 capsule 3   No current facility-administered medications on file prior to visit.     Past Medical History:  Diagnosis Date  . Anemia    iron defficiency  . Anginal pain ([place])   . Arthritis    "knees, feet, hands; joints" (05/27/2015)  . Asthma   . Atrial fibrillation or flutter    s/p RFCA 7/08;   s/p DCCV in past;   previously on amiodarone;  amio stopped due to lung toxicity  . CAD (coronary artery disease)    s/p NSTEMI tx with BMS to OM1 3/08;  cath 3/08: pOM 99% tx with PCI, pLAD 20%, ? mod stenosis at the AM  . CAP (community acquired pneumonia) 05/24/2015  . Chronic diastolic heart failure (HCC)    echo 11/11:  EF 55-60%, severe LVH, mod LAE, mild MR, mildly increased PASP  . COPD (chronic obstructive pulmonary disease) ([place])   . Degenerative joint disease   . Dystrophy, corneal stromal   . Heart murmur   . HLD (hyperlipidemia)   . HTN (hypertension)    essential nos  . Hypopotassemia    PMH of  . Muscle pain   . Myocardial infarction ([place]) 2008  . OSA on CPAP   . Osteoporosis   . Pneumonia 05/27/2015  . Protein calorie malnutrition ([place])   . Rash and nonspecific skin eruption    both arms,awaiting bio   dr Delman Cheadle  . Seasonal allergies   . Shortness of breath dyspnea   . Type II diabetes mellitus ([place])    Dr Loanne Drilling    Past Surgical History:  Procedure Laterality Date  . A FLUTTER ABLATION     Dr Lovena Le  . CATARACT EXTRACTION W/ INTRAOCULAR  LENS  IMPLANT, BILATERAL Bilateral   . CHOLECYSTECTOMY    . COLONOSCOPY  6/07   2 polyps Dr. Kinnie Feil in [place]  . colonoscopy with polypectomy      X 2; [facility]  . CORNEAL TRANSPLANT Bilateral   . CORONARY ANGIOPLASTY WITH STENT PLACEMENT  02/2007   BMS w/Dr Lovena Le  . EYE SURGERY    . gravid 2 para 2    . KNEE CARTILAGE SURGERY Right   . LEFT AND RIGHT HEART CATHETERIZATION WITH CORONARY ANGIOGRAM N/A 05/07/2014   Procedure: LEFT AND RIGHT HEART CATHETERIZATION WITH CORONARY ANGIOGRAM;  Surgeon: Peter M Martinique, MD;  Location: [facility] CATH LAB;  Service: Cardiovascular;  Laterality: N/A;  . TOTAL KNEE ARTHROPLASTY Right 06/28/2016   Procedure: TOTAL KNEE ARTHROPLASTY;  Surgeon: Frederik Pear, MD;  Location: [facility];  Service: Orthopedics;  Laterality: Right;    Social History   Socioeconomic History  . Marital status: Widowed    Spouse name: Not on file  . Number of children: Not on file  . Years of education: Not on file  . Highest education level: Not on file  Occupational History  . Occupation: Pharmacist, hospital  Social Needs  . Financial resource strain: Not hard at all  . Food insecurity:    Worry: Never true    Inability: Never true  . Transportation needs:    Medical: No    Non-medical: No  Tobacco Use  . Smoking status: Former Smoker    Packs/day: 0.25    Years: 18.00    Pack years: 4.50    Types: Cigarettes    Last attempt to quit: 12/27/1968    Years since quitting: 50.1  . Smokeless tobacco: Never Used  . Tobacco comment: smoked [place], up to 1 pp week  Substance and Sexual Activity  . Alcohol use: Yes    Comment: occ  . Drug use: No  . Sexual activity: Never  Lifestyle  . Physical activity:    Days per week: Not on file    Minutes per session: Not on file  . Stress: Not on file  Relationships  . Social connections:    Talks on phone: Not on file    Gets together: Not on file    Attends religious service: Not on file    Active member of club or organization: Not on  file    Attends meetings of clubs or organizations: Not on file    Relationship status: Not on file  Other Topics Concern  . Not on file  Social History Narrative   Teacher. Widowed. Rarely drinks cafeine.     Family History  Problem Relation Age of Onset  . Diabetes Mother   . Hypertension Mother   . Transient ischemic attack Mother   . Arthritis Mother   . Heart attack Father 65  . Arthritis Father   . Hypertension Father   . Breast cancer Maternal Aunt   . Arthritis Maternal Aunt     Review of Systems  Constitutional: Negative for chills and fever.  Respiratory: Positive for shortness of breath (with exertion - chronic and no change). Negative for cough and wheezing.   Cardiovascular: Negative for chest pain, palpitations and leg swelling.  Musculoskeletal: Positive for arthralgias (knees).  Neurological: Negative for light-headedness and headaches.       Objective:   Vitals:   01/31/19 0942  BP: (!) 154/62  Pulse: 78  Resp: 16  Temp: 98.2 F (36.8 C)  SpO2: 96%   BP Readings from Last 3 Encounters:  01/31/19 (!) 154/62  12/06/18 (!) 144/82  10/16/18 (!) 160/90   Wt Readings from Last 3 Encounters:  01/31/19 164 lb (74.4 kg)  12/06/18 165 lb 9.6 oz (75.1 kg)  10/16/18 169 lb 3.2 oz (76.7 kg)   Body mass index is 30 kg/m.   Physical Exam    Constitutional: Appears well-developed and well-nourished. No distress.  HENT:  Head: Normocephalic and atraumatic.  Neck: Neck supple. No tracheal deviation present. No thyromegaly present.  No cervical lymphadenopathy Cardiovascular: Normal rate, regular rhythm and normal heart sounds.   No murmur heard. No carotid bruit .  No edema Pulmonary/Chest: Effort  normal and breath sounds normal. No respiratory distress. No has no wheezes. No rales. Abdomen: Soft, nontender, nondistended Skin: Skin is warm and dry. Not diaphoretic.  Psychiatric: Normal mood and affect. Behavior is normal.      Assessment & Plan:      See Problem List for Assessment and Plan of chronic medical problems.

## 2019-02-01 ENCOUNTER — Encounter: Payer: Self-pay | Admitting: Internal Medicine

## 2019-02-01 ENCOUNTER — Telehealth: Payer: Self-pay | Admitting: Internal Medicine

## 2019-02-01 NOTE — Telephone Encounter (Signed)
Pt states she was notified she had a message from Dr. Quay Burow in [place] but could not open. Note from Dr.Burns read to patient, verbalizes understanding. Pt states she will alert Dr. Loanne Drilling as he has been decreased her insulin.

## 2019-02-08 ENCOUNTER — Other Ambulatory Visit (HOSPITAL_COMMUNITY): Payer: Self-pay | Admitting: Internal Medicine

## 2019-02-08 ENCOUNTER — Other Ambulatory Visit: Payer: Self-pay

## 2019-02-08 ENCOUNTER — Telehealth: Payer: Self-pay | Admitting: Endocrinology

## 2019-02-08 DIAGNOSIS — Z794 Long term (current) use of insulin: Principal | ICD-10-CM

## 2019-02-08 DIAGNOSIS — N183 Chronic kidney disease, stage 3 (moderate): Principal | ICD-10-CM

## 2019-02-08 DIAGNOSIS — E1122 Type 2 diabetes mellitus with diabetic chronic kidney disease: Secondary | ICD-10-CM

## 2019-02-08 MED ORDER — GLUCOSE BLOOD VI STRP: ORAL_STRIP | 5 refills | Status: DC

## 2019-02-08 MED ORDER — APIXABAN 5 MG PO TABS: 5.0000 mg | ORAL_TABLET | Freq: Two times a day (BID) | ORAL | 3 refills | Status: DC

## 2019-02-08 MED ORDER — ONETOUCH ULTRASOFT LANCETS MISC: 2 refills | Status: DC

## 2019-02-08 NOTE — Telephone Encounter (Signed)
Rx's sent as requested: E-Prescribing Status: Receipt confirmed by pharmacy (02/08/2019 4:21 PM EST)  A1C = 8.9 01/31/19. Last OV 12/06/18 and next OV 04/11/19. Taking Novolin 70/30 18 units q AM. Saw PCP 01/31/19. Newly developed abd pain and cramping. Referred to GI for further eval. Please advise

## 2019-02-08 NOTE — Telephone Encounter (Signed)
Called pt to reschedule appt. LVM requesting returned call.

## 2019-02-08 NOTE — Telephone Encounter (Signed)
Patient would like a call back to discuss her A1C  PLEASE ADVISE   She is also needing a prescription sent in for her LANCETS. [facility] [address], [place] [address]

## 2019-02-08 NOTE — Telephone Encounter (Signed)
a1c has gone up.  Please move up next ov to next available

## 2019-02-15 ENCOUNTER — Ambulatory Visit: Payer: Medicare Other | Admitting: Endocrinology

## 2019-02-22 ENCOUNTER — Other Ambulatory Visit: Payer: Self-pay

## 2019-02-22 ENCOUNTER — Encounter: Payer: Self-pay | Admitting: Endocrinology

## 2019-02-22 ENCOUNTER — Ambulatory Visit (INDEPENDENT_AMBULATORY_CARE_PROVIDER_SITE_OTHER): Payer: Medicare Other | Admitting: Endocrinology

## 2019-02-22 VITALS — BP 130/80 | HR 73 | Ht 62.0 in | Wt 163.8 lb

## 2019-02-22 DIAGNOSIS — N183 Chronic kidney disease, stage 3 (moderate): Secondary | ICD-10-CM | POA: Diagnosis not present

## 2019-02-22 DIAGNOSIS — E1122 Type 2 diabetes mellitus with diabetic chronic kidney disease: Secondary | ICD-10-CM | POA: Diagnosis not present

## 2019-02-22 DIAGNOSIS — Z794 Long term (current) use of insulin: Secondary | ICD-10-CM | POA: Diagnosis not present

## 2019-02-22 LAB — TSH: TSH: 1.52 u[IU]/mL (ref 0.35–4.50)

## 2019-02-22 LAB — T4, FREE: FREE T4: 0.94 ng/dL (ref 0.60–1.60)

## 2019-02-22 MED ORDER — INSULIN NPH ISOPHANE & REGULAR (70-30) 100 UNIT/ML ~~LOC~~ SUSP: 20.0000 [IU] | Freq: Every day | SUBCUTANEOUS | 11 refills | Status: DC

## 2019-02-22 NOTE — Patient Instructions (Addendum)
check your blood sugar twice a day.  vary the time of day when you check, between before the 3 meals, and at bedtime.  also check if you have symptoms of your blood sugar being too high or too low.  please keep a record of the readings and bring it to your next appointment here.  please call us sooner if your blood sugar goes below 70, or if you have a lot of readings over 200.   Please increase the insulin back up to 20 units with breakfast.   Thyroid blood tests are requested for you today.  We'll let you know about the results.   On this type of insulin schedule, you should eat meals on a regular schedule.  In particular, lunch should be no more than 3 hrs after breakfast.  If a meal is missed or significantly delayed, your blood sugar could go low.   Please come back for a follow-up appointment in 3-4 months.

## 2019-02-22 NOTE — Progress Notes (Signed)
Subjective:    Patient ID: [name], female    DOB: [birth year], 85 y.o.   MRN: [id number]  HPI Pt returns for f/u of diabetes mellitus:  DM type: Insulin-requiring type 2.   Dx'ed: 0102 Complications: CAD, polyneuropathy, and renal insufficiency.  Therapy: insulin since 2007.  GDM: never.  DKA: never. Severe hypoglycemia: never.  Pancreatitis: never.  Other: she changed to qd insulin, after poor results with multiple daily injections; she was changed from lantus to QAM NPH, then to 70/30, due to am hypoglycemia and PM hyperglycemia.   Interval history: she brings a record of her cbg's which I have reviewed today. cbg varies from 150-180.  It is lowest fasting.  She takes 20 units qam.   Pt also has hyperthyroidism (dx'ed 2012; [place] then showed multinodular goiter; tapazole was chosen as rx, due to low uptake on nuc med scan; tapazole was stopped, due to rash; plan will be for RAI when this recurs off rx).  Past Medical History:  Diagnosis Date  . Anemia    iron defficiency  . Anginal pain ([place])   . Arthritis    "knees, feet, hands; joints" (05/27/2015)  . Asthma   . Atrial fibrillation or flutter    s/p RFCA 7/08;   s/p DCCV in past;   previously on amiodarone;  amio stopped due to lung toxicity  . CAD (coronary artery disease)    s/p NSTEMI tx with BMS to OM1 3/08;  cath 3/08: pOM 99% tx with PCI, pLAD 20%, ? mod stenosis at the AM  . CAP (community acquired pneumonia) 05/24/2015  . Chronic diastolic heart failure (HCC)    echo 11/11:  EF 55-60%, severe LVH, mod LAE, mild MR, mildly increased PASP  . COPD (chronic obstructive pulmonary disease) ([place])   . Degenerative joint disease   . Dystrophy, corneal stromal   . Heart murmur   . HLD (hyperlipidemia)   . HTN (hypertension)    essential nos  . Hypopotassemia    PMH of  . Muscle pain   . Myocardial infarction ([place]) 2008  . OSA on CPAP   . Osteoporosis   . Pneumonia 05/27/2015  . Protein calorie malnutrition ([place])    . Rash and nonspecific skin eruption    both arms,awaiting bio   dr Delman Cheadle  . Seasonal allergies   . Shortness of breath dyspnea   . Type II diabetes mellitus ([place])    Dr Loanne Drilling    Past Surgical History:  Procedure Laterality Date  . A FLUTTER ABLATION     Dr Lovena Le  . CATARACT EXTRACTION W/ INTRAOCULAR LENS  IMPLANT, BILATERAL Bilateral   . CHOLECYSTECTOMY    . COLONOSCOPY  6/07   2 polyps Dr. Kinnie Feil in [place]  . colonoscopy with polypectomy      X 2; [facility]  . CORNEAL TRANSPLANT Bilateral   . CORONARY ANGIOPLASTY WITH STENT PLACEMENT  02/2007   BMS w/Dr Lovena Le  . EYE SURGERY    . gravid 2 para 2    . KNEE CARTILAGE SURGERY Right   . LEFT AND RIGHT HEART CATHETERIZATION WITH CORONARY ANGIOGRAM N/A 05/07/2014   Procedure: LEFT AND RIGHT HEART CATHETERIZATION WITH CORONARY ANGIOGRAM;  Surgeon: Peter M Martinique, MD;  Location: [facility] CATH LAB;  Service: Cardiovascular;  Laterality: N/A;  . TOTAL KNEE ARTHROPLASTY Right 06/28/2016   Procedure: TOTAL KNEE ARTHROPLASTY;  Surgeon: Frederik Pear, MD;  Location: [facility];  Service: Orthopedics;  Laterality: Right;    Social History  Socioeconomic History  . Marital status: Widowed    Spouse name: Not on file  . Number of children: Not on file  . Years of education: Not on file  . Highest education level: Not on file  Occupational History  . Occupation: Pharmacist, hospital  Social Needs  . Financial resource strain: Not hard at all  . Food insecurity:    Worry: Never true    Inability: Never true  . Transportation needs:    Medical: No    Non-medical: No  Tobacco Use  . Smoking status: Former Smoker    Packs/day: 0.25    Years: 18.00    Pack years: 4.50    Types: Cigarettes    Last attempt to quit: 12/27/1968    Years since quitting: 50.1  . Smokeless tobacco: Never Used  . Tobacco comment: smoked [place], up to 1 pp week  Substance and Sexual Activity  . Alcohol use: Yes    Comment: occ  . Drug use: No  . Sexual activity: Never    Lifestyle  . Physical activity:    Days per week: Not on file    Minutes per session: Not on file  . Stress: Not on file  Relationships  . Social connections:    Talks on phone: Not on file    Gets together: Not on file    Attends religious service: Not on file    Active member of club or organization: Not on file    Attends meetings of clubs or organizations: Not on file    Relationship status: Not on file  . Intimate partner violence:    Fear of current or ex partner: Not on file    Emotionally abused: Not on file    Physically abused: Not on file    Forced sexual activity: Not on file  Other Topics Concern  . Not on file  Social History Narrative   Teacher. Widowed. Rarely drinks cafeine.     Current Outpatient Medications on File Prior to Visit  Medication Sig Dispense Refill  . acetaminophen (TYLENOL) 500 MG tablet Take 1,000 mg by mouth every 6 (six) hours as needed (pain).    Marland Kitchen apixaban (ELIQUIS) 5 MG TABS tablet Take 1 tablet (5 mg total) by mouth 2 (two) times daily. 60 tablet 3  . CALCIUM-MAG-VIT C-VIT D PO Take 1 tablet by mouth daily.    . carvedilol (COREG) 6.25 MG tablet TAKE 1 TABLET BY MOUTH TWO  TIMES DAILY WITH MEALS 180 tablet 3  . cholecalciferol (VITAMIN D) 1000 units tablet Take 1,000 Units by mouth daily after supper.    . diclofenac sodium (VOLTAREN) 1 % GEL Apply 4 g topically 4 (four) times daily. 100 g 5  . furosemide (LASIX) 80 MG tablet TAKE 1 TABLET BY MOUTH TWO  TIMES DAILY 180 tablet 3  . glucose blood (ONETOUCH VERIO) test strip Use to monitor glucose levels 2 times daily 200 each 5  . Lancets (ONETOUCH ULTRASOFT) lancets Use to monitor glucose levels 2 times daily 200 each 2  . Multiple Vitamin (MULTIVITAMIN WITH MINERALS) TABS tablet Take 1 tablet by mouth daily. Centrum Silver    . nitroGLYCERIN (NITROSTAT) 0.4 MG SL tablet Place 1 tablet (0.4 mg total) under the tongue every 5 (five) minutes as needed for chest pain. Reported on 01/28/2016 30  tablet 3  . potassium chloride SA (K-DUR,KLOR-CON) 20 MEQ tablet Take 2 tablets (40 mEq total) by mouth daily. 180 tablet 3  . tiotropium (SPIRIVA HANDIHALER)  18 MCG inhalation capsule Place 1 capsule (18 mcg total) into inhaler and inhale daily. 90 capsule 3   No current facility-administered medications on file prior to visit.     Allergies  Allergen Reactions  . Amlodipine Besylate Other (See Comments)    REACTION: tingling in lips & gum edema  . Levaquin [Levofloxacin] Shortness Of Breath and Swelling    angioedema  . Lobster [Shellfish Allergy] Other (See Comments)    angioedema  . Penicillins Rash    Has patient had a PCN reaction causing immediate rash, facial/tongue/throat swelling, SOB or lightheadedness with hypotension: Yes Has patient had a PCN reaction causing severe rash involving mucus membranes or skin necrosis: No Has patient had a PCN reaction that required hospitalization: No Has patient had a PCN reaction occurring within the last 10 years: No If all of the above answers are "NO", then may proceed with Cephalosporin use.  . Valsartan Other (See Comments)    REACTION: angioedema  . Codeine Other (See Comments)    Mental status changes  . Lipitor [Atorvastatin] Other (See Comments)    weakness  . Oxycodone Other (See Comments)    hallucinations  . Statins     Made her too weak  . Tramadol   . Tramadol Hcl Nausea And Vomiting    Family History  Problem Relation Age of Onset  . Diabetes Mother   . Hypertension Mother   . Transient ischemic attack Mother   . Arthritis Mother   . Heart attack Father 11  . Arthritis Father   . Hypertension Father   . Breast cancer Maternal Aunt   . Arthritis Maternal Aunt     BP 130/80 (BP Location: Right Arm, Patient Position: Sitting, Cuff Size: Normal)   Pulse 73   Ht 5\' 2"  (1.575 m)   Wt 163 lb 12.8 oz (74.3 kg)   SpO2 90%   BMI 29.96 kg/m    Review of Systems She denies hypoglycemia    Objective:    Physical Exam VITAL SIGNS:  See vs page GENERAL: no distress Pulses: dorsalis pedis intact bilat.   MSK: no deformity of the feet CV: no leg edema Skin:  no ulcer on the feet.  normal color and temp on the feet. Neuro: sensation is intact to touch on the feet Ext: There is bilateral onychomycosis of the toenails   Lab Results  Component Value Date   HGBA1C 8.9 (H) 01/31/2019   Lab Results  Component Value Date   CREATININE 1.18 01/31/2019   BUN 26 (H) 01/31/2019   NA 137 01/31/2019   K 4.3 01/31/2019   CL 97 01/31/2019   CO2 27 01/31/2019       Assessment & Plan:  Insulin-requiring type 2 DM, with renal insuff: worse Hyperthyroidism: recheck today  Patient Instructions  check your blood sugar twice a day.  vary the time of day when you check, between before the 3 meals, and at bedtime.  also check if you have symptoms of your blood sugar being too high or too low.  please keep a record of the readings and bring it to your next appointment here.  please call us sooner if your blood sugar goes below 70, or if you have a lot of readings over 200.   Please increase the insulin back up to 20 units with breakfast.   Thyroid blood tests are requested for you today.  We'll let you know about the results.   On this type of insulin schedule,  you should eat meals on a regular schedule.  In particular, lunch should be no more than 3 hrs after breakfast.  If a meal is missed or significantly delayed, your blood sugar could go low.   Please come back for a follow-up appointment in 3-4 months.

## 2019-02-27 ENCOUNTER — Other Ambulatory Visit: Payer: Self-pay | Admitting: Internal Medicine

## 2019-02-27 ENCOUNTER — Telehealth: Payer: Self-pay | Admitting: Endocrinology

## 2019-02-27 DIAGNOSIS — Z1231 Encounter for screening mammogram for malignant neoplasm of breast: Secondary | ICD-10-CM

## 2019-02-27 NOTE — Telephone Encounter (Signed)
Returned call. Informed of lab results. Also advised she follow up with IT if she continues to have issues with her MyChart. Verbalized acceptance and understanding.

## 2019-02-27 NOTE — Telephone Encounter (Signed)
Patient is requesting a call back in regards to her lab results. She stated that she has not heard from anyone for the results. And she is unable to get into her my chart

## 2019-03-29 ENCOUNTER — Ambulatory Visit: Payer: Medicare Other

## 2019-04-11 ENCOUNTER — Ambulatory Visit: Payer: Medicare Other | Admitting: Endocrinology

## 2019-04-19 ENCOUNTER — Ambulatory Visit (HOSPITAL_COMMUNITY)
Admission: RE | Admit: 2019-04-19 | Discharge: 2019-04-19 | Disposition: A | Discharge status: Home / Self Care | Payer: Medicare Other | Source: Ambulatory Visit | Attending: Internal Medicine | Admitting: Internal Medicine

## 2019-04-19 ENCOUNTER — Other Ambulatory Visit: Payer: Self-pay

## 2019-04-19 DIAGNOSIS — I2510 Atherosclerotic heart disease of native coronary artery without angina pectoris: Secondary | ICD-10-CM

## 2019-04-19 DIAGNOSIS — I5022 Chronic systolic (congestive) heart failure: Secondary | ICD-10-CM

## 2019-04-19 DIAGNOSIS — E782 Mixed hyperlipidemia: Secondary | ICD-10-CM

## 2019-04-19 DIAGNOSIS — I10 Essential (primary) hypertension: Secondary | ICD-10-CM

## 2019-04-19 DIAGNOSIS — I482 Chronic atrial fibrillation, unspecified: Secondary | ICD-10-CM

## 2019-04-19 MED ORDER — ROSUVASTATIN CALCIUM 20 MG PO TABS: 20.0000 mg | ORAL_TABLET | Freq: Every day | ORAL | 3 refills | Status: DC

## 2019-04-19 NOTE — Progress Notes (Signed)
Heart Failure TeleHealth Note  Due to national recommendations of social distancing due to [place] 19, Audio/video telehealth visit is felt to be most appropriate for this patient at this time.  See MyChart message from today for patient consent regarding telehealth for [facility].  Date:  04/19/2019   ID:  [name], DOB Oct 29, 1931, MRN [id number]  Location: Home  Provider location: [facility] Type of Visit: Established patient  PCP:  [name], MD  Cardiologist:  No primary care provider on file. Primary HF: Herby Amick  Chief Complaint: Heart Failure follow-up   History of Present Illness:  [name] is a 86 y.o. female with CAD, HTN, atrial fibrillation/A flutter and chronic prior systolic heart failure, (which was likely rate related) and diastolic heart failure. She was referred by Dr. Lovena Le for further evaluation of Pulmonary HTN.   Underwent 2D echo in 3/15 which showed EF 60-65% mild LVH. RV was normal. No significant valvular abnormalities.   Underwent R/L cath 05/07/14 Which showed stable CAD and significant PH with normal PVR.   Admitted 05/21/17-05/27/17 with COPD exacerbation, acute on chronic diastolic CHF. Diuresed 5L with IV Lasix. Discharge weight 173 pounds.  She presents today via Engineer, civil (consulting) for a telehealth visit today during the [place] pandemic. Doing very well. Has been diligent about her diet and lost 5 pound. Weight 160. SBP 115-130s usually (checking regularly) occasionally can get in the 140-150 range. Says she has turned 85 she is getting younger. No edema, orthopnea or PND. Exercising every morning   [name] denies symptoms worrisome for COVID 19.     Studies:  RA 6 RV 71/7 6 PA 63/17 (33)  PCWP 19 mm Hg  LV 174/16 mm Hg  AO 171/62 mean 103 mm Hg  PVR = 2.86 Oxygen saturations:  PA 69%  AO 99%  Cardiac Output/Index (Fick) 4.88/2.7    Left mainstem: Normal.  Left  anterior descending (LAD): Moderate calcification proximally. Mild irregularities less than 10%. The first diagonal has 40-50% ostial disease.  Left circumflex (LCx): The LCx gives rise to a large OM1 then terminates in the AV groove. There is 20% disease in the proximal LCx. The first OM stent is patent with diffuse 20% disease.  Right coronary artery (RCA): The RCA arises anteriorly. There is an eccentric slit like stenosis in the proximal vessel to 60-70%. The mid vessel has segmental 70-80% disease.  Left ventriculography: Left ventricular systolic function is normal, LVEF is estimated at 55-65%, there is no significant mitral regurgitation     Past Medical History:  Diagnosis Date   Anemia    iron defficiency   Anginal pain (HCC)    Arthritis    "knees, feet, hands; joints" (05/27/2015)   Asthma    Atrial fibrillation or flutter    s/p RFCA 7/08;   s/p DCCV in past;   previously on amiodarone;  amio stopped due to lung toxicity   CAD (coronary artery disease)    s/p NSTEMI tx with BMS to OM1 3/08;  cath 3/08: pOM 99% tx with PCI, pLAD 20%, ? mod stenosis at the AM   CAP (community acquired pneumonia) 05/24/2015   Chronic diastolic heart failure ([place])    echo 11/11:  EF 55-60%, severe LVH, mod LAE, mild MR, mildly increased PASP   COPD (chronic obstructive pulmonary disease) (HCC)    Degenerative joint disease    Dystrophy, corneal stromal    Heart murmur  HLD (hyperlipidemia)    HTN (hypertension)    essential nos   Hypopotassemia    PMH of   Muscle pain    Myocardial infarction ([place]) 2008   OSA on CPAP    Osteoporosis    Pneumonia 05/27/2015   Protein calorie malnutrition (HCC)    Rash and nonspecific skin eruption    both arms,awaiting bio   dr Delman Cheadle   Seasonal allergies    Shortness of breath dyspnea    Type II diabetes mellitus ([place])    Dr Loanne Drilling   Past Surgical History:  Procedure Laterality Date   A FLUTTER ABLATION     Dr Lovena Le     CATARACT EXTRACTION W/ INTRAOCULAR LENS  IMPLANT, BILATERAL Bilateral    CHOLECYSTECTOMY     COLONOSCOPY  6/07   2 polyps Dr. Kinnie Feil in [place]   colonoscopy with polypectomy      X 2; [facility]   CORNEAL TRANSPLANT Bilateral    CORONARY ANGIOPLASTY WITH STENT PLACEMENT  02/2007   BMS w/Dr Lovena Le   EYE SURGERY     gravid 2 para 2     KNEE CARTILAGE SURGERY Right    LEFT AND RIGHT HEART CATHETERIZATION WITH CORONARY ANGIOGRAM N/A 05/07/2014   Procedure: LEFT AND RIGHT HEART CATHETERIZATION WITH CORONARY ANGIOGRAM;  Surgeon: Peter M Martinique, MD;  [facility];  Service: Cardiovascular;  Laterality: N/A;   TOTAL KNEE ARTHROPLASTY Right 06/28/2016   Procedure: TOTAL KNEE ARTHROPLASTY;  Surgeon: Frederik Pear, MD;  Location: [place];  Service: Orthopedics;  Laterality: Right;     Current Outpatient Medications  Medication Sig Dispense Refill   acetaminophen (TYLENOL) 500 MG tablet Take 1,000 mg by mouth every 6 (six) hours as needed (pain).     apixaban (ELIQUIS) 5 MG TABS tablet Take 1 tablet (5 mg total) by mouth 2 (two) times daily. 60 tablet 3   CALCIUM-MAG-VIT C-VIT D PO Take 1 tablet by mouth daily.     carvedilol (COREG) 6.25 MG tablet TAKE 1 TABLET BY MOUTH TWO  TIMES DAILY WITH MEALS 180 tablet 3   cholecalciferol (VITAMIN D) 1000 units tablet Take 1,000 Units by mouth daily after supper.     diclofenac sodium (VOLTAREN) 1 % GEL Apply 4 g topically 4 (four) times daily. 100 g 5   furosemide (LASIX) 80 MG tablet TAKE 1 TABLET BY MOUTH TWO  TIMES DAILY 180 tablet 3   glucose blood (ONETOUCH VERIO) test strip Use to monitor glucose levels 2 times daily 200 each 5   insulin NPH-regular Human (NOVOLIN 70/30 RELION) (70-30) 100 UNIT/ML injection Inject 20 Units into the skin daily with breakfast. And syringes 1/day 10 mL 11   Lancets (ONETOUCH ULTRASOFT) lancets Use to monitor glucose levels 2 times daily 200 each 2   Multiple Vitamin (MULTIVITAMIN WITH MINERALS)  TABS tablet Take 1 tablet by mouth daily. Centrum Silver     nitroGLYCERIN (NITROSTAT) 0.4 MG SL tablet Place 1 tablet (0.4 mg total) under the tongue every 5 (five) minutes as needed for chest pain. Reported on 01/28/2016 30 tablet 3   potassium chloride SA (K-DUR,KLOR-CON) 20 MEQ tablet Take 2 tablets (40 mEq total) by mouth daily. 180 tablet 3   tiotropium (SPIRIVA HANDIHALER) 18 MCG inhalation capsule Place 1 capsule (18 mcg total) into inhaler and inhale daily. 90 capsule 3   No current facility-administered medications for this encounter.     Allergies:   Amlodipine besylate; Levaquin [levofloxacin]; Lobster [shellfish allergy]; Penicillins; Valsartan; Codeine;  Lipitor [atorvastatin]; Oxycodone; Statins; Tramadol; and Tramadol hcl   Social History:  The patient  reports that she quit smoking about 50 years ago. Her smoking use included cigarettes. She has a 4.50 pack-year smoking history. She has never used smokeless tobacco. She reports current alcohol use. She reports that she does not use drugs.   Family History:  The patient's family history includes Arthritis in her father, maternal aunt, and mother; Breast cancer in her maternal aunt; Diabetes in her mother; Heart attack (age of onset: 19) in her father; Hypertension in her father and mother; Transient ischemic attack in her mother.   ROS:  Please see the history of present illness.   All other systems are personally reviewed and negative.   Exam:  (Video/Tele Health Call; Exam is subjective and or/visual.) General:  Speaks in full sentences. No resp difficulty. Lungs: Normal respiratory effort with conversation.  Abdomen: Non-distended per patient report Extremities: Pt denies edema. Neuro: Alert & oriented x 3.   Recent Labs: 01/31/2019: ALT 16; BUN 26; Creatinine, Ser 1.18; Hemoglobin 15.7; Platelets 187.0; Potassium 4.3; Sodium 137 02/22/2019: TSH 1.52  Personally reviewed   Wt Readings from Last 3 Encounters:  02/22/19  74.3 kg (163 lb 12.8 oz)  01/31/19 74.4 kg (164 lb)  12/06/18 75.1 kg (165 lb 9.6 oz)      ASSESSMENT AND PLAN:  1) Chronic diastolic CHF:  - Echo 06/3219 EF 60-65%, LA severely dilated, Echo 10/2017: EF 65-70%, grade 3 DD - Improved with her lifestyle changes. NYHA II symptoms, confounded by severe arthritis.  - Volume status stable on exam.   - Continue lasix 80 mg BID.  - Continue KCL to 40 bid - Labs stable 23/5/20  2) Persistent atrial fib/flutter - This patients CHA2DS2-VASc Score and unadjusted Ischemic Stroke Rate (% per year) is equal to 7.2 % stroke rate/year from a score of 5 Above score calculated as 2 points each if present [Age > 75, or Stroke/TIA/TE] - Rate controlled on coreg 6.25 mg BID - Continue Eliquis. Denies bleeding.   3) OSA/COPD - Follows with Dr. Halford Chessman.  - Not wearing CPAP. Not very interested in pursuing a new mask.   4) CAD - Last cath 2015 with moderate CAD.  - No s/s of ischemia - LDL high will resume Crestor 20  5) HTN - She is following closely - BP much improved with dietary change.  6) DM2 - Consider Jardiance. I will talk with Dr. Loanne Drilling   7) HL  - recent lipids with LDL 246 - resume Crestor 20 daily   COVID screen The patient does not have any symptoms that suggest any further testing/ screening at this time.  Social distancing reinforced today.  Recommended follow-up:  As above  Relevant cardiac medications were reviewed at length with the patient today.   The patient does not have concerns regarding their medications at this time.   The following changes were made today:  As above  Today, I have spent 16 minutes with the patient with telehealth technology discussing the above issues .    Signed, Glori Bickers, MD  04/19/2019 11:44 AM  [facility] [address] [place] [address] [phone] (office) [phone] (fax)

## 2019-04-19 NOTE — Progress Notes (Signed)
Spoke w/pt regarding starting Crestor 20 mg daily per Dr Haroldine Laws, she does have some concerns as she has had issues with statins in the past.  We discussed use of co q 10 and she will get some of this to try, Crestor sent in and she is agreeable to try it, she will let us know if she has any issues.

## 2019-04-19 NOTE — Addendum Note (Signed)
Encounter addended by: Scarlette Calico, RN on: 04/19/2019 1:52 PM  Actions taken: Pharmacy for encounter modified, Order list changed, Clinical Note Signed

## 2019-04-27 ENCOUNTER — Other Ambulatory Visit: Payer: Self-pay

## 2019-04-27 ENCOUNTER — Ambulatory Visit (INDEPENDENT_AMBULATORY_CARE_PROVIDER_SITE_OTHER): Payer: Medicare Other | Admitting: Endocrinology

## 2019-04-27 DIAGNOSIS — E1122 Type 2 diabetes mellitus with diabetic chronic kidney disease: Secondary | ICD-10-CM | POA: Diagnosis not present

## 2019-04-27 DIAGNOSIS — N183 Chronic kidney disease, stage 3 unspecified: Secondary | ICD-10-CM

## 2019-04-27 DIAGNOSIS — Z794 Long term (current) use of insulin: Secondary | ICD-10-CM

## 2019-04-27 NOTE — Patient Instructions (Addendum)
check your blood sugar twice a day.  vary the time of day when you check, between before the 3 meals, and at bedtime.  also check if you have symptoms of your blood sugar being too high or too low.  please keep a record of the readings and bring it to your next appointment here.  please call us sooner if your blood sugar goes below 70, or if you have a lot of readings over 200.   Please come in to have the A1c checked.   On this type of insulin schedule, you should eat meals on a regular schedule.  In particular, lunch should be no more than 3 hrs after breakfast.  If a meal is missed or significantly delayed, your blood sugar could go low.   Please come back for a follow-up appointment in 3 months.

## 2019-04-27 NOTE — Progress Notes (Signed)
Subjective:    Patient ID: [name], female    DOB: 11-Jun-1931, 85 y.o.   MRN: [id number]  HPI  telehealth visit today via doxy video visit.  Alternatives to telehealth are presented to this patient, and the patient agrees to the telehealth visit. Pt is advised of the cost of the visit, and agrees to this, also.   Patient is at home, and I am at the office.   Persons attending the telehealth visit: the patient and I.  Pt returns for f/u of diabetes mellitus:  DM type: Insulin-requiring type 2.   Dx'ed: 4431 Complications: CAD, polyneuropathy, and renal insufficiency.   Therapy: insulin since 2007.  GDM: never.  DKA: never. Severe hypoglycemia: never.   Pancreatitis: never.   Other: she changed to qd insulin, after poor results with multiple daily injections; she was changed from lantus to QAM NPH, then to 70/30, due to am hypoglycemia and PM hyperglycemia.   Interval history: she brings a record of her cbg's which I have reviewed today. cbg varies from 70-190.  There is no trend throughout the day.  She takes 20 units qam.   Pt also has hyperthyroidism (dx'ed 2012; [place] then showed multinodular goiter; tapazole was chosen as rx, due to low uptake on nuc med scan; tapazole was stopped, due to rash; plan will be for RAI when this recurs off rx).   Past Medical History:  Diagnosis Date  . Anemia    iron defficiency  . Anginal pain ([place])   . Arthritis    "knees, feet, hands; joints" (05/27/2015)  . Asthma   . Atrial fibrillation or flutter    s/p RFCA 7/08;   s/p DCCV in past;   previously on amiodarone;  amio stopped due to lung toxicity  . CAD (coronary artery disease)    s/p NSTEMI tx with BMS to OM1 3/08;  cath 3/08: pOM 99% tx with PCI, pLAD 20%, ? mod stenosis at the AM  . CAP (community acquired pneumonia) 05/24/2015  . Chronic diastolic heart failure (HCC)    echo 11/11:  EF 55-60%, severe LVH, mod LAE, mild MR, mildly increased PASP  . COPD (chronic obstructive  pulmonary disease) ([place])   . Degenerative joint disease   . Dystrophy, corneal stromal   . Heart murmur   . HLD (hyperlipidemia)   . HTN (hypertension)    essential nos  . Hypopotassemia    PMH of  . Muscle pain   . Myocardial infarction ([place]) 2008  . OSA on CPAP   . Osteoporosis   . Pneumonia 05/27/2015  . Protein calorie malnutrition ([place])   . Rash and nonspecific skin eruption    both arms,awaiting bio   dr Delman Cheadle  . Seasonal allergies   . Shortness of breath dyspnea   . Type II diabetes mellitus ([place])    Dr Loanne Drilling    Past Surgical History:  Procedure Laterality Date  . A FLUTTER ABLATION     Dr Lovena Le  . CATARACT EXTRACTION W/ INTRAOCULAR LENS  IMPLANT, BILATERAL Bilateral   . CHOLECYSTECTOMY    . COLONOSCOPY  6/07   2 polyps Dr. Kinnie Feil in [place]  . colonoscopy with polypectomy      X 2; [facility]  . CORNEAL TRANSPLANT Bilateral   . CORONARY ANGIOPLASTY WITH STENT PLACEMENT  02/2007   BMS w/Dr Lovena Le  . EYE SURGERY    . gravid 2 para 2    . KNEE CARTILAGE SURGERY Right   . LEFT  AND RIGHT HEART CATHETERIZATION WITH CORONARY ANGIOGRAM N/A 05/07/2014   Procedure: LEFT AND RIGHT HEART CATHETERIZATION WITH CORONARY ANGIOGRAM;  Surgeon: Peter M Martinique, MD;  Location[facility] C[facility] LAB;  Service: Cardiovascular;  Laterality: N/A;  . TOTAL KNEE ARTHROPLASTY Right 06/28/2016   Procedure: TOTAL KNEE ARTHROPLASTY;  Surgeon: Frederik Pear, MD;  Location: [place];  Service: Orthopedics;  Laterality: Right;    Social History   Socioeconomic History  . Marital status: Widowed    Spouse name: Not on file  . Number of children: Not on file  . Years of education: Not on file  . Highest education level: Not on file  Occupational History  . Occupation: Pharmacist, hospital  Social Needs  . Financial resource strain: Not hard at all  . Food insecurity:    Worry: Never true    Inability: Never true  . Transportation needs:    Medical: No    Non-medical: No  Tobacco Use  . Smoking status: Former Smoker     Packs/day: 0.25    Years: 18.00    Pack years: 4.50    Types: Cigarettes    Last attempt to quit: 12/27/1968    Years since quitting: 50.3  . Smokeless tobacco: Never Used  . Tobacco comment: smoked Terminous, up to 1 pp week  Substance and Sexual Activity  . Alcohol use: Yes    Comment: occ  . Drug use: No  . Sexual activity: Never  Lifestyle  . Physical activity:    Days per week: Not on file    Minutes per session: Not on file  . Stress: Not on file  Relationships  . Social connections:    Talks on phone: Not on file    Gets together: Not on file    Attends religious service: Not on file    Active member of club or organization: Not on file    Attends meetings of clubs or organizations: Not on file    Relationship status: Not on file  . Intimate partner violence:    Fear of current or ex partner: Not on file    Emotionally abused: Not on file    Physically abused: Not on file    Forced sexual activity: Not on file  Other Topics Concern  . Not on file  Social History Narrative   Teacher. Widowed. Rarely drinks cafeine.     Current Outpatient Medications on File Prior to Visit  Medication Sig Dispense Refill  . acetaminophen (TYLENOL) 500 MG tablet Take 1,000 mg by mouth every 6 (six) hours as needed (pain).    Marland Kitchen apixaban (ELIQUIS) 5 MG TABS tablet Take 1 tablet (5 mg total) by mouth 2 (two) times daily. 60 tablet 3  . CALCIUM-MAG-VIT C-VIT D PO Take 1 tablet by mouth daily.    . carvedilol (COREG) 6.25 MG tablet TAKE 1 TABLET BY MOUTH TWO  TIMES DAILY WITH MEALS 180 tablet 3  . cholecalciferol (VITAMIN D) 1000 units tablet Take 1,000 Units by mouth daily after supper.    . diclofenac sodium (VOLTAREN) 1 % GEL Apply 4 g topically 4 (four) times daily. 100 g 5  . furosemide (LASIX) 80 MG tablet TAKE 1 TABLET BY MOUTH TWO  TIMES DAILY 180 tablet 3  . glucose blood (ONETOUCH VERIO) test strip Use to monitor glucose levels 2 times daily 200 each 5  . insulin NPH-regular  Human (NOVOLIN 70/30 RELION) (70-30) 100 UNIT/ML injection Inject 20 Units into the skin daily with breakfast. And syringes  1/day 10 mL 11  . Lancets (ONETOUCH ULTRASOFT) lancets Use to monitor glucose levels 2 times daily 200 each 2  . Multiple Vitamin (MULTIVITAMIN WITH MINERALS) TABS tablet Take 1 tablet by mouth daily. Centrum Silver    . nitroGLYCERIN (NITROSTAT) 0.4 MG SL tablet Place 1 tablet (0.4 mg total) under the tongue every 5 (five) minutes as needed for chest pain. Reported on 01/28/2016 30 tablet 3  . potassium chloride SA (K-DUR,KLOR-CON) 20 MEQ tablet Take 2 tablets (40 mEq total) by mouth daily. 180 tablet 3  . rosuvastatin (CRESTOR) 20 MG tablet Take 1 tablet (20 mg total) by mouth daily. 90 tablet 3  . tiotropium (SPIRIVA HANDIHALER) 18 MCG inhalation capsule Place 1 capsule (18 mcg total) into inhaler and inhale daily. 90 capsule 3   No current facility-administered medications on file prior to visit.     Allergies  Allergen Reactions  . Amlodipine Besylate Other (See Comments)    REACTION: tingling in lips & gum edema  . Levaquin [Levofloxacin] Shortness Of Breath and Swelling    angioedema  . Lobster [Shellfish Allergy] Other (See Comments)    angioedema  . Penicillins Rash    Has patient had a PCN reaction causing immediate rash, facial/tongue/throat swelling, SOB or lightheadedness with hypotension: Yes Has patient had a PCN reaction causing severe rash involving mucus membranes or skin necrosis: No Has patient had a PCN reaction that required hospitalization: No Has patient had a PCN reaction occurring within the last 10 years: No If all of the above answers are "NO", then may proceed with Cephalosporin use.  . Valsartan Other (See Comments)    REACTION: angioedema  . Codeine Other (See Comments)    Mental status changes  . Lipitor [Atorvastatin] Other (See Comments)    weakness  . Oxycodone Other (See Comments)    hallucinations  . Statins     Made her too  weak  . Tramadol   . Tramadol Hcl Nausea And Vomiting    Family History  Problem Relation Age of Onset  . Diabetes Mother   . Hypertension Mother   . Transient ischemic attack Mother   . Arthritis Mother   . Heart attack Father 76  . Arthritis Father   . Hypertension Father   . Breast cancer Maternal Aunt   . Arthritis Maternal Aunt    Review of Systems She denies hypoglycemia.       Objective:   Physical Exam   Lab Results  Component Value Date   CREATININE 1.18 01/31/2019   BUN 26 (H) 01/31/2019   NA 137 01/31/2019   K 4.3 01/31/2019   CL 97 01/31/2019   CO2 27 01/31/2019      Assessment & Plan:  Insulin-requiring type 2 DM, with PN: we discussed adding or transitioning to oral rx.  Pt says she'll think it over.   Renal insuff: if he starts SGLT-2, she would need to reduce lasix.    Patient Instructions  check your blood sugar twice a day.  vary the time of day when you check, between before the 3 meals, and at bedtime.  also check if you have symptoms of your blood sugar being too high or too low.  please keep a record of the readings and bring it to your next appointment here.  please call us sooner if your blood sugar goes below 70, or if you have a lot of readings over 200.   Please come in to have the A1c checked.  On this type of insulin schedule, you should eat meals on a regular schedule.  In particular, lunch should be no more than 3 hrs after breakfast.  If a meal is missed or significantly delayed, your blood sugar could go low.   Please come back for a follow-up appointment in 3 months.

## 2019-05-01 ENCOUNTER — Other Ambulatory Visit: Payer: Self-pay

## 2019-05-01 ENCOUNTER — Ambulatory Visit (INDEPENDENT_AMBULATORY_CARE_PROVIDER_SITE_OTHER): Payer: Medicare Other

## 2019-05-01 DIAGNOSIS — N183 Chronic kidney disease, stage 3 (moderate): Secondary | ICD-10-CM | POA: Diagnosis not present

## 2019-05-01 DIAGNOSIS — E1122 Type 2 diabetes mellitus with diabetic chronic kidney disease: Secondary | ICD-10-CM

## 2019-05-01 DIAGNOSIS — Z794 Long term (current) use of insulin: Secondary | ICD-10-CM | POA: Diagnosis not present

## 2019-05-01 LAB — POCT GLYCOSYLATED HEMOGLOBIN (HGB A1C): Hemoglobin A1C: 8.1 % — AB (ref 4.0–5.6)

## 2019-05-02 ENCOUNTER — Other Ambulatory Visit: Payer: Self-pay

## 2019-05-02 DIAGNOSIS — E1122 Type 2 diabetes mellitus with diabetic chronic kidney disease: Secondary | ICD-10-CM

## 2019-05-02 DIAGNOSIS — Z794 Long term (current) use of insulin: Principal | ICD-10-CM

## 2019-05-02 DIAGNOSIS — N183 Chronic kidney disease, stage 3 (moderate): Principal | ICD-10-CM

## 2019-05-02 MED ORDER — INSULIN NPH ISOPHANE & REGULAR (70-30) 100 UNIT/ML ~~LOC~~ SUSP: 22.0000 [IU] | Freq: Every day | SUBCUTANEOUS | 11 refills | Status: DC

## 2019-05-05 ENCOUNTER — Other Ambulatory Visit (HOSPITAL_COMMUNITY): Payer: Self-pay | Admitting: Internal Medicine

## 2019-05-08 ENCOUNTER — Telehealth (HOSPITAL_COMMUNITY): Payer: Self-pay | Admitting: *Deleted

## 2019-05-08 NOTE — Telephone Encounter (Signed)
Pt left VM stating since she started crestor she has experienced a rash on her thigh and buttocks. Pt wants to know if this will go away or if she needs to be put on another medication.   Message routed to [place] for advice.

## 2019-05-08 NOTE — Telephone Encounter (Signed)
I thought she was on it before. If it is clearly related to crestor should stop.

## 2019-05-09 NOTE — Telephone Encounter (Signed)
Called patient back she said the rash started and has not gone away since the start of crestor pt stated she just started crestor in April. Pt will discontinue crestor and wants to make dietary changes before starting a new statin.

## 2019-05-23 ENCOUNTER — Ambulatory Visit
Admission: RE | Admit: 2019-05-23 | Discharge: 2019-05-23 | Disposition: A | Discharge status: Home / Self Care | Payer: Medicare Other | Source: Ambulatory Visit | Attending: Internal Medicine | Admitting: Internal Medicine

## 2019-05-23 ENCOUNTER — Other Ambulatory Visit: Payer: Self-pay

## 2019-05-23 ENCOUNTER — Other Ambulatory Visit (HOSPITAL_COMMUNITY): Payer: Self-pay | Admitting: Internal Medicine

## 2019-05-23 DIAGNOSIS — Z1231 Encounter for screening mammogram for malignant neoplasm of breast: Secondary | ICD-10-CM | POA: Diagnosis not present

## 2019-05-30 DIAGNOSIS — R6889 Other general symptoms and signs: Secondary | ICD-10-CM | POA: Diagnosis not present

## 2019-06-11 DIAGNOSIS — H04123 Dry eye syndrome of bilateral lacrimal glands: Secondary | ICD-10-CM | POA: Diagnosis not present

## 2019-06-11 DIAGNOSIS — R6889 Other general symptoms and signs: Secondary | ICD-10-CM | POA: Diagnosis not present

## 2019-06-11 DIAGNOSIS — Z947 Corneal transplant status: Secondary | ICD-10-CM | POA: Diagnosis not present

## 2019-06-11 DIAGNOSIS — Z961 Presence of intraocular lens: Secondary | ICD-10-CM | POA: Diagnosis not present

## 2019-06-11 DIAGNOSIS — H18423 Band keratopathy, bilateral: Secondary | ICD-10-CM | POA: Diagnosis not present

## 2019-06-11 DIAGNOSIS — E113291 Type 2 diabetes mellitus with mild nonproliferative diabetic retinopathy without macular edema, right eye: Secondary | ICD-10-CM | POA: Diagnosis not present

## 2019-06-25 ENCOUNTER — Ambulatory Visit: Payer: Medicare Other | Admitting: Endocrinology

## 2019-06-27 ENCOUNTER — Other Ambulatory Visit: Payer: Self-pay | Admitting: *Deleted

## 2019-06-27 DIAGNOSIS — Z20822 Contact with and (suspected) exposure to covid-19: Secondary | ICD-10-CM

## 2019-07-01 LAB — NOVEL CORONAVIRUS, NAA: SARS-CoV-2, NAA: NOT DETECTED

## 2019-07-31 NOTE — Progress Notes (Signed)
Virtual Visit via Video Note  I connected with [name] on 08/01/19 at 11:00 AM EDT by a video enabled telemedicine application and verified that I am speaking with the correct person using two identifiers.   I discussed the limitations of evaluation and management by telemedicine and the availability of in person appointments. The patient expressed understanding and agreed to proceed.  The patient is currently at home and I am in the office.    No referring provider.    History of Present Illness: She is here for follow up of her chronic medical conditions.   She is exercising regularly - doing exercise at home since gym is closed.     Diabetes: she is following with Dr Loanne Drilling and has an upcoming appointment.  She is taking her medication daily as prescribed. She is compliant with a diabetic diet.  She monitors her sugars and they have been running < 130.    Afib/flutter, HFpEF, Hypertension: She is taking her medication daily. She is compliant with a low sodium diet.  She denies palpitations, edema and regular headaches. She does monitor her blood pressure at home.    Hyperlipidemia: She is statin intolerant and not currently on any medication. She is compliant with a low fat/cholesterol diet.    COPD:  She has chronic DOE.  She is using her spiriva daily.  It does help.  She denies cough, wheeze.    Osteoarthritis:  She takes tylenol as needed. She uses the voltaren gel.  She wishes she had something stronger for her arthritis.  She thinks brand tylenol is more effective than the generic and will try that again.   Having difficulty sleeping:  She denies anxiety.  She has difficulty falling asleep and wakes up a couple of times a night. She wondered if she should try melatonin.  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  She states it is not getting better and is interested in seeing a different  dermatologist.  Review of Systems  Constitutional: Negative for chills and fever.  Respiratory: Positive for shortness of breath. Negative for cough and wheezing.   Cardiovascular: Positive for chest pain (tightness - using NTG). Negative for palpitations and leg swelling.  Neurological: Negative for dizziness and headaches.      Social History   Socioeconomic History  . Marital status: Widowed    Spouse name: Not on file  . Number of children: Not on file  . Years of education: Not on file  . Highest education level: Not on file  Occupational History  . Occupation: Pharmacist, hospital  Social Needs  . Financial resource strain: Not hard at all  . Food insecurity    Worry: Never true    Inability: Never true  . Transportation needs    Medical: No    Non-medical: No  Tobacco Use  . Smoking status: Former Smoker    Packs/day: 0.25    Years: 18.00    Pack years: 4.50    Types: Cigarettes    Quit date: 12/27/1968    Years since quitting: 50.6  . Smokeless tobacco: Never Used  . Tobacco comment: smoked [place], up to 1 pp week  Substance and Sexual Activity  . Alcohol use: Yes    Comment: occ  . Drug use: No  . Sexual activity: Never  Lifestyle  . Physical activity    Days per week: Not on file  Minutes per session: Not on file  . Stress: Not on file  Relationships  . Social Herbalist on phone: Not on file    Gets together: Not on file    Attends religious service: Not on file    Active member of club or organization: Not on file    Attends meetings of clubs or organizations: Not on file    Relationship status: Not on file  Other Topics Concern  . Not on file  Social History Narrative   Teacher. Widowed. Rarely drinks cafeine.      Observations/Objective: Appears well in NAD  Weight 161 BP today 150/76  --- range 108-160 Sugar today 130 - usually less   Assessment and Plan:  See Problem List for Assessment and Plan of chronic medical  problems.   Follow Up Instructions:    I discussed the assessment and treatment plan with the patient. The patient was provided an opportunity to ask questions and all were answered. The patient agreed with the plan and demonstrated an understanding of the instructions.   The patient was advised to call back or seek an in-person evaluation if the symptoms worsen or if the condition fails to improve as anticipated.  FU in 67months  [name], MD

## 2019-08-01 ENCOUNTER — Ambulatory Visit (INDEPENDENT_AMBULATORY_CARE_PROVIDER_SITE_OTHER): Payer: Medicare Other | Admitting: Internal Medicine

## 2019-08-01 ENCOUNTER — Encounter: Payer: Self-pay | Admitting: Internal Medicine

## 2019-08-01 DIAGNOSIS — N183 Chronic kidney disease, stage 3 unspecified: Secondary | ICD-10-CM

## 2019-08-01 DIAGNOSIS — E1122 Type 2 diabetes mellitus with diabetic chronic kidney disease: Secondary | ICD-10-CM

## 2019-08-01 DIAGNOSIS — Z794 Long term (current) use of insulin: Secondary | ICD-10-CM

## 2019-08-01 DIAGNOSIS — I10 Essential (primary) hypertension: Secondary | ICD-10-CM | POA: Diagnosis not present

## 2019-08-01 DIAGNOSIS — G479 Sleep disorder, unspecified: Secondary | ICD-10-CM | POA: Insufficient documentation

## 2019-08-01 DIAGNOSIS — I5032 Chronic diastolic (congestive) heart failure: Secondary | ICD-10-CM | POA: Diagnosis not present

## 2019-08-01 DIAGNOSIS — L920 Granuloma annulare: Secondary | ICD-10-CM | POA: Diagnosis not present

## 2019-08-01 DIAGNOSIS — M1990 Unspecified osteoarthritis, unspecified site: Secondary | ICD-10-CM | POA: Insufficient documentation

## 2019-08-01 DIAGNOSIS — M150 Primary generalized (osteo)arthritis: Secondary | ICD-10-CM

## 2019-08-01 DIAGNOSIS — M159 Polyosteoarthritis, unspecified: Secondary | ICD-10-CM

## 2019-08-01 DIAGNOSIS — E782 Mixed hyperlipidemia: Secondary | ICD-10-CM

## 2019-08-01 DIAGNOSIS — I25119 Atherosclerotic heart disease of native coronary artery with unspecified angina pectoris: Secondary | ICD-10-CM

## 2019-08-01 NOTE — Assessment & Plan Note (Signed)
Having difficulty sleeping recently-falling asleep and staying asleep We will try melatonin or a more natural supplement over-the-counter Continue regular exercise Good sleep hygiene

## 2019-08-01 NOTE — Assessment & Plan Note (Signed)
Continue Tylenol-we will try brand to see if that is more effective Continue Voltaren gel Continue regular exercise

## 2019-08-01 NOTE — Assessment & Plan Note (Signed)
Interested in seeing a different dermatologist for further evaluation and treatment She is currently using a cream prescribed by Dr. Delman Cheadle and it does not seem to be improving Referral ordered

## 2019-08-01 NOTE — Assessment & Plan Note (Signed)
Appears to be euvolemic Following with cardiology/CHF clinic Continue current medications

## 2019-08-01 NOTE — Assessment & Plan Note (Signed)
Statin intolerant She is exercising and eating healthy

## 2019-08-01 NOTE — Assessment & Plan Note (Signed)
Blood pressure on average controlled, but variable Continue current medications at current doses

## 2019-08-01 NOTE — Assessment & Plan Note (Signed)
Following with cardiology She has been experiencing chest tightness and has been taking more nitroglycerin-she thinks the chest tightness comes when she is more anxious Has an upcoming appointment with cardiology and will discuss with them

## 2019-08-01 NOTE — Assessment & Plan Note (Signed)
Management per Dr. Azucena Kuba has an upcoming appointment Sugars well controlled at home

## 2019-08-07 ENCOUNTER — Other Ambulatory Visit: Payer: Self-pay

## 2019-08-07 DIAGNOSIS — E1122 Type 2 diabetes mellitus with diabetic chronic kidney disease: Secondary | ICD-10-CM

## 2019-08-07 MED ORDER — ONETOUCH ULTRASOFT LANCETS MISC: 2 refills | Status: DC

## 2019-08-07 MED ORDER — ONETOUCH VERIO VI STRP: ORAL_STRIP | 5 refills | Status: DC

## 2019-08-10 ENCOUNTER — Other Ambulatory Visit: Payer: Self-pay

## 2019-08-14 ENCOUNTER — Ambulatory Visit: Payer: Medicare Other | Admitting: Endocrinology

## 2019-08-20 ENCOUNTER — Other Ambulatory Visit: Payer: Self-pay

## 2019-08-22 ENCOUNTER — Ambulatory Visit (INDEPENDENT_AMBULATORY_CARE_PROVIDER_SITE_OTHER): Payer: Medicare Other | Admitting: Endocrinology

## 2019-08-22 ENCOUNTER — Other Ambulatory Visit: Payer: Self-pay

## 2019-08-22 ENCOUNTER — Encounter: Payer: Self-pay | Admitting: Endocrinology

## 2019-08-22 ENCOUNTER — Ambulatory Visit: Payer: Self-pay | Admitting: *Deleted

## 2019-08-22 VITALS — BP 150/70 | HR 95 | Ht 62.0 in | Wt 166.8 lb

## 2019-08-22 DIAGNOSIS — E042 Nontoxic multinodular goiter: Secondary | ICD-10-CM | POA: Diagnosis not present

## 2019-08-22 DIAGNOSIS — E1122 Type 2 diabetes mellitus with diabetic chronic kidney disease: Secondary | ICD-10-CM

## 2019-08-22 DIAGNOSIS — N183 Chronic kidney disease, stage 3 unspecified: Secondary | ICD-10-CM

## 2019-08-22 DIAGNOSIS — Z794 Long term (current) use of insulin: Secondary | ICD-10-CM

## 2019-08-22 DIAGNOSIS — Z23 Encounter for immunization: Secondary | ICD-10-CM

## 2019-08-22 LAB — LIPID PANEL
Cholesterol: 290 mg/dL — ABNORMAL HIGH (ref 0–200)
HDL: 56.1 mg/dL (ref >39.00)
LDL Cholesterol: 207 mg/dL — ABNORMAL HIGH (ref 0–99)
NonHDL: 233.62
Total CHOL/HDL Ratio: 5
Triglycerides: 135 mg/dL (ref 0.0–149.0)
VLDL: 27 mg/dL (ref 0.0–40.0)

## 2019-08-22 LAB — POCT GLYCOSYLATED HEMOGLOBIN (HGB A1C): Hemoglobin A1C: 7.6 % — AB (ref 4.0–5.6)

## 2019-08-22 LAB — T4, FREE: Free T4: 0.87 ng/dL (ref 0.60–1.60)

## 2019-08-22 LAB — TSH: TSH: 1.5 u[IU]/mL (ref 0.35–4.50)

## 2019-08-22 NOTE — Telephone Encounter (Signed)
2 days ago, the patient  put all her pills (5 of them) in her mouth at one time and got choked. She has coughed every since. It "sounds like she has asthma at times". Coughed last night and mucous came up, once. The coughing is not constant. She may cough 3 times in a day. No fever. It does not keep her awake a night. She denies chest pain or shortness of breath. Her pharmacist recommended that she uses honey and lemon for the throat irritation. Also suggested sugar free cough drops. Advised also to drink fluids also for the irritation. She voiced understanding and will call back for increase in symptoms, such as fever, shortness of breath, or chest pain. She voiced understanding. Routing to [facility] at [place] for review.   Reason for Disposition . Cough  Answer Assessment - Initial Assessment Questions 1. ONSET: "When did the cough begin?"      2 days ago after getting strangled on her medication 2. SEVERITY: "How bad is the cough today?"      Not bad 3. RESPIRATORY DISTRESS: "Describe your breathing."      Normal breathing 4. FEVER: "Do you have a fever?" If so, ask: "What is your temperature, how was it measured, and when did it start?"     no 5. HEMOPTYSIS: "Are you coughing up any blood?" If so ask: "How much?" (flecks, streaks, tablespoons, etc.)     no 6. TREATMENT: "What have you done so far to treat the cough?" (e.g., meds, fluids, humidifier)     Fluids  7. CARDIAC HISTORY: "Do you have any history of heart disease?" (e.g., heart attack, congestive heart failure)      CHF, afib 8. LUNG HISTORY: "Do you have any history of lung disease?"  (e.g., pulmonary embolus, asthma, emphysema)    Sleep apnea 9. PE RISK FACTORS: "Do you have a history of blood clots?" (or: recent major surgery, recent prolonged travel, bedridden)     no 10. OTHER SYMPTOMS: "Do you have any other symptoms? (e.g., runny nose, wheezing, chest pain)      Rarely wheezing 11. PREGNANCY: "Is there any chance you  are pregnant?" "When was your last menstrual period?"       n/a 12. TRAVEL: "Have you traveled out of the country in the last month?" (e.g., travel history, exposures)       no  Protocols used: COUGH - ACUTE NON-PRODUCTIVE-A-AH

## 2019-08-22 NOTE — Patient Instructions (Addendum)
Your blood pressure is high today.  Please see your primary care provider soon, to have it rechecked Please reduce the insulin to 20 units with breakfast On this type of insulin schedule, you should eat meals on a regular schedule.  If a meal is missed or significantly delayed, your blood sugar could go low. check your blood sugar twice a day.  vary the time of day when you check, between before the 3 meals, and at bedtime.  also check if you have symptoms of your blood sugar being too high or too low.  please keep a record of the readings and bring it to your next appointment here (or you can bring the meter itself).  You can write it on any piece of paper.  please call us sooner if your blood sugar goes below 70, or if you have a lot of readings over 200. Blood tests are requested for you today.  We'll let you know about the results.  Please come back for a follow-up appointment in 3 months.

## 2019-08-22 NOTE — Progress Notes (Signed)
Subjective:    Patient ID: [name], female    DOB: 10/26/1931, 85 y.o.   MRN: [id number]  HPI Pt returns for f/u of diabetes mellitus:  DM type: Insulin-requiring type 2.   Dx'ed: [id number] Complications: CAD, polyneuropathy, and renal insufficiency.   Therapy: insulin since 2007.  GDM: never.  DKA: never. Severe hypoglycemia: never.   Pancreatitis: never.   Other: she changed to qd insulin, after poor results with multiple daily injections; she was changed from lantus to QAM NPH, then to 70/30, due to am hypoglycemia and PM hyperglycemia.   Interval history: no cbg record, but states cbg varies from 66-189.  It is in general lowest in the afternoon.   Pt also has hyperthyroidism (dx'ed 2012; [place] then showed multinodular goiter; tapazole was chosen as rx, due to low uptake on nuc med scan; tapazole was stopped, due to rash; plan will be for RAI when this recurs off rx).   She has declined rx for dyslipidemia.   Past Medical History:  Diagnosis Date   Anemia    iron defficiency   Anginal pain ([place])    Arthritis    "knees, feet, hands; joints" (05/27/2015)   Asthma    Atrial fibrillation or flutter    s/p RFCA 7/08;   s/p DCCV in past;   previously on amiodarone;  amio stopped due to lung toxicity   CAD (coronary artery disease)    s/p NSTEMI tx with BMS to OM1 3/08;  cath 3/08: pOM 99% tx with PCI, pLAD 20%, ? mod stenosis at the AM   CAP (community acquired pneumonia) 05/24/2015   Chronic diastolic heart failure ([place])    echo 11/11:  EF 55-60%, severe LVH, mod LAE, mild MR, mildly increased PASP   COPD (chronic obstructive pulmonary disease) ([facility])    Degenerative joint disease    Dystrophy, corneal stromal    Heart murmur    HLD (hyperlipidemia)    HTN (hypertension)    essential nos   Hypopotassemia    PMH of   Muscle pain    Myocardial infarction ([place]) 2008   OSA on CPAP    Osteoporosis    Pneumonia 05/27/2015   Protein calorie malnutrition  (H[facility])    Rash and nonspecific skin eruption    both arms,awaiting bio   dr Delman Cheadle   Seasonal allergies    Shortness of breath dyspnea    Type II diabetes mellitus ([place])    Dr Loanne Drilling    Past Surgical History:  Procedure Laterality Date   A FLUTTER ABLATION     Dr Lovena Le   CATARACT EXTRACTION W/ INTRAOCULAR LENS  IMPLANT, BILATERAL Bilateral    CHOLECYSTECTOMY     COLONOSCOPY  6/07   2 polyps Dr. Kinnie Feil in [place]   colonoscopy with polypectomy      X 2; [facility]   CORNEAL TRANSPLANT Bilateral    CORONARY ANGIOPLASTY WITH STENT PLACEMENT  02/2007   BMS w/Dr Lovena Le   EYE SURGERY     gravid 2 para 2     KNEE CARTILAGE SURGERY Right    LEFT AND RIGHT HEART CATHETERIZATION WITH CORONARY ANGIOGRAM N/A 05/07/2014   Procedure: LEFT AND RIGHT HEART CATHETERIZATION WITH CORONARY ANGIOGRAM;  Surgeon: Peter M Martinique, MD;  Location: [facility] CATH LAB;  Service: Cardiovascular;  Laterality: N/A;   TOTAL KNEE ARTHROPLASTY Right 06/28/2016   Procedure: TOTAL KNEE ARTHROPLASTY;  Surgeon: Frederik Pear, MD;  Location: [place];  Service: Orthopedics;  Laterality: Right;  Social History   Socioeconomic History   Marital status: Widowed    Spouse name: Not on file   Number of children: Not on file   Years of education: Not on file   Highest education level: Not on file  Occupational History   Occupation: Pharmacist, hospital  Social Needs   Financial resource strain: Not hard at all   Food insecurity    Worry: Never true    Inability: Never true   Transportation needs    Medical: No    Non-medical: No  Tobacco Use   Smoking status: Former Smoker    Packs/day: 0.25    Years: 18.00    Pack years: 4.50    Types: Cigarettes    Quit date: 12/27/1968    Years since quitting: 50.6   Smokeless tobacco: Never Used   Tobacco comment: smoked [place], up to 1 pp week  Substance and Sexual Activity   Alcohol use: Yes    Comment: occ   Drug use: No   Sexual activity: Never  Lifestyle     Physical activity    Days per week: Not on file    Minutes per session: Not on file   Stress: Not on file  Relationships   Social connections    Talks on phone: Not on file    Gets together: Not on file    Attends religious service: Not on file    Active member of club or organization: Not on file    Attends meetings of clubs or organizations: Not on file    Relationship status: Not on file   Intimate partner violence    Fear of current or ex partner: Not on file    Emotionally abused: Not on file    Physically abused: Not on file    Forced sexual activity: Not on file  Other Topics Concern   Not on file  Social History Narrative   Pharmacist, hospital. Widowed. Rarely drinks cafeine.     Current Outpatient Medications on File Prior to Visit  Medication Sig Dispense Refill   acetaminophen (TYLENOL) 500 MG tablet Take 1,000 mg by mouth every 6 (six) hours as needed (pain).     CALCIUM-MAG-VIT C-VIT D PO Take 1 tablet by mouth daily.     carvedilol (COREG) 6.25 MG tablet TAKE 1 TABLET BY MOUTH TWO  TIMES DAILY WITH MEALS 180 tablet 3   cholecalciferol (VITAMIN D) 1000 units tablet Take 1,000 Units by mouth daily after supper.     diclofenac sodium (VOLTAREN) 1 % GEL Apply 4 g topically 4 (four) times daily. 100 g 5   ELIQUIS 5 MG TABS tablet TAKE 1 TABLET BY MOUTH TWO  TIMES DAILY 180 tablet 1   furosemide (LASIX) 80 MG tablet TAKE 1 TABLET BY MOUTH TWO  TIMES DAILY 180 tablet 3   glucose blood (ONETOUCH VERIO) test strip Use to monitor glucose levels 2 times daily 200 each 5   insulin NPH-regular Human (NOVOLIN 70/30 RELION) (70-30) 100 UNIT/ML injection Inject 22 Units into the skin daily with breakfast. And syringes 1/day 10 mL 11   Lancets (ONETOUCH ULTRASOFT) lancets Use to monitor glucose levels 2 times daily 200 each 2   Multiple Vitamin (MULTIVITAMIN WITH MINERALS) TABS tablet Take 1 tablet by mouth daily. Centrum Silver     nitroGLYCERIN (NITROSTAT) 0.4 MG SL tablet  Place 1 tablet (0.4 mg total) under the tongue every 5 (five) minutes as needed for chest pain. Reported on 01/28/2016 30 tablet 3  potassium chloride SA (K-DUR) 20 MEQ tablet TAKE 2 TABLETS BY MOUTH  DAILY 180 tablet 3   tiotropium (SPIRIVA HANDIHALER) 18 MCG inhalation capsule Place 1 capsule (18 mcg total) into inhaler and inhale daily. 90 capsule 3   No current facility-administered medications on file prior to visit.     Allergies  Allergen Reactions   Amlodipine Besylate Other (See Comments)    REACTION: tingling in lips & gum edema   Levaquin [Levofloxacin] Shortness Of Breath and Swelling    angioedema   Lobster [Shellfish Allergy] Other (See Comments)    angioedema   Penicillins Rash    Has patient had a PCN reaction causing immediate rash, facial/tongue/throat swelling, SOB or lightheadedness with hypotension: Yes Has patient had a PCN reaction causing severe rash involving mucus membranes or skin necrosis: No Has patient had a PCN reaction that required hospitalization: No Has patient had a PCN reaction occurring within the last 10 years: No If all of the above answers are "NO", then may proceed with Cephalosporin use.   Valsartan Other (See Comments)    REACTION: angioedema   Codeine Other (See Comments)    Mental status changes   Lipitor [Atorvastatin] Other (See Comments)    weakness   Oxycodone Other (See Comments)    hallucinations   Statins     Made her too weak   Tramadol    Crestor [Rosuvastatin Calcium] Rash   Tramadol Hcl Nausea And Vomiting    Family History  Problem Relation Age of Onset   Diabetes Mother    Hypertension Mother    Transient ischemic attack Mother    Arthritis Mother    Heart attack Father 10   Arthritis Father    Hypertension Father    Breast cancer Maternal Aunt    Arthritis Maternal Aunt     BP (!) 150/70 (BP Location: Left Arm, Patient Position: Sitting, Cuff Size: Large)    Pulse 95    Ht 5\' 2"  (1.575  m)    Wt 166 lb 12.8 oz (75.7 kg)    SpO2 93%    BMI 30.51 kg/m   Review of Systems Denies LOC    Objective:   Physical Exam VITAL SIGNS:  See vs page GENERAL: no distress Pulses: dorsalis pedis intact bilat.   MSK: no deformity of the feet CV: no leg edema Skin:  no ulcer on the feet.  normal color and temp on the feet. Neuro: sensation is intact to touch on the feet  Lab Results  Component Value Date   HGBA1C 7.6 (A) 08/22/2019   Lab Results  Component Value Date   CREATININE 1.18 01/31/2019   BUN 26 (H) 01/31/2019   NA 137 01/31/2019   K 4.3 01/31/2019   CL 97 01/31/2019   CO2 27 01/31/2019      Assessment & Plan:  HTN: is noted today Hyperthyroidism: recheck today Insulin-requiring type 2 DM, with renal insuff: this is the best control this pt should aim for, given this regimen, which does match insulin to her changing needs throughout the day Hypoglycemia: this limits aggressiveness of glycemic control  Patient Instructions  Your blood pressure is high today.  Please see your primary care provider soon, to have it rechecked Please reduce the insulin to 20 units with breakfast On this type of insulin schedule, you should eat meals on a regular schedule.  If a meal is missed or significantly delayed, your blood sugar could go low. check your blood sugar twice a  day.  vary the time of day when you check, between before the 3 meals, and at bedtime.  also check if you have symptoms of your blood sugar being too high or too low.  please keep a record of the readings and bring it to your next appointment here (or you can bring the meter itself).  You can write it on any piece of paper.  please call us sooner if your blood sugar goes below 70, or if you have a lot of readings over 200. Blood tests are requested for you today.  We'll let you know about the results.  Please come back for a follow-up appointment in 3 months.

## 2019-08-27 DIAGNOSIS — R6889 Other general symptoms and signs: Secondary | ICD-10-CM | POA: Diagnosis not present

## 2019-08-27 DIAGNOSIS — L989 Disorder of the skin and subcutaneous tissue, unspecified: Secondary | ICD-10-CM | POA: Diagnosis not present

## 2019-08-27 DIAGNOSIS — D485 Neoplasm of uncertain behavior of skin: Secondary | ICD-10-CM | POA: Diagnosis not present

## 2019-09-05 DIAGNOSIS — R6889 Other general symptoms and signs: Secondary | ICD-10-CM | POA: Diagnosis not present

## 2019-09-18 NOTE — Progress Notes (Signed)
Advanced Heart Failure Clinic Note   Date:  09/19/2019   ID:  [name], DOB March 17, 1931, MRN [id number]  Location: Home  Provider location: [facility] Type of Visit: Established patient  PCP:  [name], MD  Cardiologist:  No primary care provider on file. Primary HF: Bensimhon  Chief Complaint: Heart Failure follow-up   History of Present Illness:  [name] is a 85 y.o. female with CAD s/p 1v stent, HTN, atrial fibrillation/A flutter and chronic prior systolic heart failure, (which was likely rate related) and diastolic heart failure. She was referred by Dr. Lovena Le for further evaluation of Pulmonary HTN.   Underwent 2D echo in 3/15 which showed EF 60-65% mild LVH. RV was normal. No significant valvular abnormalities.   Underwent R/L cath 05/07/14 Which showed stable CAD and significant PH with normal PVR.   Admitted 05/21/17-05/27/17 with COPD exacerbation, acute on chronic diastolic CHF. Diuresed 5L with IV Lasix. Discharge weight 173 pounds.  She presents today for routine f/u. Doing very well. Says her breathing is much worse due to inactivity from COVID. Can go to the store but will ride the cart. No orthopnea or PND. In past 2 months has had recurrent chest tightness about once a week. Comes on with emotional stress. Gets better with 1 NTG. Does not get it with exertion. SBP 140-150    Studies:  RA 6 RV 71/7 6 PA 63/17 (33)  PCWP 19 mm Hg  LV 174/16 mm Hg  AO 171/62 mean 103 mm Hg  PVR = 2.86 Oxygen saturations:  PA 69%  AO 99%  Cardiac Output/Index (Fick) 4.88/2.7    Left mainstem: Normal.  Left anterior descending (LAD): Moderate calcification proximally. Mild irregularities less than 10%. The first diagonal has 40-50% ostial disease.  Left circumflex (LCx): The LCx gives rise to a large OM1 then terminates in the AV groove. There is 20% disease in the proximal LCx. The first OM stent is patent with diffuse  20% disease.  Right coronary artery (RCA): The RCA arises anteriorly. There is an eccentric slit like stenosis in the proximal vessel to 60-70%. The mid vessel has segmental 70-80% disease.  Left ventriculography: Left ventricular systolic function is normal, LVEF is estimated at 55-65%, there is no significant mitral regurgitation     Past Medical History:  Diagnosis Date  . Anemia    iron defficiency  . Anginal pain ([place])   . Arthritis    "knees, feet, hands; joints" (05/27/2015)  . Asthma   . Atrial fibrillation or flutter    s/p RFCA 7/08;   s/p DCCV in past;   previously on amiodarone;  amio stopped due to lung toxicity  . CAD (coronary artery disease)    s/p NSTEMI tx with BMS to OM1 3/08;  cath 3/08: pOM 99% tx with PCI, pLAD 20%, ? mod stenosis at the AM  . CAP (community acquired pneumonia) 05/24/2015  . Chronic diastolic heart failure (HCC)    echo 11/11:  EF 55-60%, severe LVH, mod LAE, mild MR, mildly increased PASP  . COPD (chronic obstructive pulmonary disease) ([place])   . Degenerative joint disease   . Dystrophy, corneal stromal   . Heart murmur   . HLD (hyperlipidemia)   . HTN (hypertension)    essential nos  . Hypopotassemia    PMH of  . Muscle pain   . Myocardial infarction ([place]) 2008  . OSA on CPAP   . Osteoporosis   .  Pneumonia 05/27/2015  . Protein calorie malnutrition ([place])   . Rash and nonspecific skin eruption    both arms,awaiting bio   dr Delman Cheadle  . Seasonal allergies   . Shortness of breath dyspnea   . Type II diabetes mellitus ([place])    Dr Loanne Drilling   Past Surgical History:  Procedure Laterality Date  . A FLUTTER ABLATION     Dr Lovena Le  . CATARACT EXTRACTION W/ INTRAOCULAR LENS  IMPLANT, BILATERAL Bilateral   . CHOLECYSTECTOMY    . COLONOSCOPY  6/07   2 polyps Dr. Kinnie Feil in [place]  . colonoscopy with polypectomy      X 2; [facility]  . CORNEAL TRANSPLANT Bilateral   . CORONARY ANGIOPLASTY WITH STENT PLACEMENT  02/2007   BMS w/Dr Lovena Le  . EYE  SURGERY    . gravid 2 para 2    . KNEE CARTILAGE SURGERY Right   . LEFT AND RIGHT HEART CATHETERIZATION WITH CORONARY ANGIOGRAM N/A 05/07/2014   Procedure: LEFT AND RIGHT HEART CATHETERIZATION WITH CORONARY ANGIOGRAM;  Surgeon: Peter M Martinique, MD;  Location: [facility] CATH LAB;  Service: Cardiovascular;  Laterality: N/A;  . TOTAL KNEE ARTHROPLASTY Right 06/28/2016   Procedure: TOTAL KNEE ARTHROPLASTY;  Surgeon: Frederik Pear, MD;  Location: [facility];  Service: Orthopedics;  Laterality: Right;     Current Outpatient Medications  Medication Sig Dispense Refill  . acetaminophen (TYLENOL) 500 MG tablet Take 1,000 mg by mouth every 6 (six) hours as needed (pain).    . CALCIUM-MAG-VIT C-VIT D PO Take 1 tablet by mouth daily.    . carvedilol (COREG) 6.25 MG tablet TAKE 1 TABLET BY MOUTH TWO  TIMES DAILY WITH MEALS 180 tablet 3  . cholecalciferol (VITAMIN D) 1000 units tablet Take 1,000 Units by mouth daily after supper.    . diclofenac sodium (VOLTAREN) 1 % GEL Apply 4 g topically 4 (four) times daily. 100 g 5  . ELIQUIS 5 MG TABS tablet TAKE 1 TABLET BY MOUTH TWO  TIMES DAILY 180 tablet 1  . furosemide (LASIX) 80 MG tablet TAKE 1 TABLET BY MOUTH TWO  TIMES DAILY 180 tablet 3  . glucose blood (ONETOUCH VERIO) test strip Use to monitor glucose levels 2 times daily 200 each 5  . insulin NPH-regular Human (NOVOLIN 70/30 RELION) (70-30) 100 UNIT/ML injection Inject 22 Units into the skin daily with breakfast. And syringes 1/day 10 mL 11  . Lancets (ONETOUCH ULTRASOFT) lancets Use to monitor glucose levels 2 times daily 200 each 2  . Multiple Vitamin (MULTIVITAMIN WITH MINERALS) TABS tablet Take 1 tablet by mouth daily. Centrum Silver    . nitroGLYCERIN (NITROSTAT) 0.4 MG SL tablet Place 1 tablet (0.4 mg total) under the tongue every 5 (five) minutes as needed for chest pain. Reported on 01/28/2016 30 tablet 3  . potassium chloride SA (K-DUR) 20 MEQ tablet TAKE 2 TABLETS BY MOUTH  DAILY 180 tablet 3  . tiotropium  (SPIRIVA HANDIHALER) 18 MCG inhalation capsule Place 1 capsule (18 mcg total) into inhaler and inhale daily. 90 capsule 3   No current facility-administered medications for this encounter.     Allergies:   Amlodipine besylate, Levaquin [levofloxacin], Lobster [shellfish allergy], Penicillins, Valsartan, Codeine, Lipitor [atorvastatin], Oxycodone, Statins, Tramadol, Crestor [rosuvastatin calcium], and Tramadol hcl   Social History:  The patient  reports that she quit smoking about 50 years ago. Her smoking use included cigarettes. She has a 4.50 pack-year smoking history. She has never used smokeless tobacco. She reports current alcohol  use. She reports that she does not use drugs.   Family History:  The patient's family history includes Arthritis in her father, maternal aunt, and mother; Breast cancer in her maternal aunt; Diabetes in her mother; Heart attack (age of onset: 62) in her father; Hypertension in her father and mother; Transient ischemic attack in her mother.   ROS:  Please see the history of present illness.   All other systems are personally reviewed and negative.   Vitals:   09/19/19 1419  BP: (!) 141/72  Pulse: 87  SpO2: 94%  Weight: 75.2 kg (165 lb 12.8 oz)    Exam:   General:  Well appearing. No resp difficulty HEENT: normal Neck: supple. no JVD. Carotids 2+ bilat; no bruits. No lymphadenopathy or thryomegaly appreciated. Cor: PMI nondisplaced. Irregular rate & rhythm. No rubs, gallops or murmurs. Lungs: clear Abdomen: soft, nontender, nondistended. No hepatosplenomegaly. No bruits or masses. Good bowel sounds. Extremities: no cyanosis, clubbing, rash, edema Neuro: alert & orientedx3, cranial nerves grossly intact. moves all 4 extremities w/o difficulty. Affect pleasant  Recent Labs: 01/31/2019: ALT 16; BUN 26; Creatinine, Ser 1.18; Hemoglobin 15.7; Platelets 187.0; Potassium 4.3; Sodium 137 08/22/2019: TSH 1.50  Personally reviewed   Wt Readings from Last 3  Encounters:  09/19/19 75.2 kg (165 lb 12.8 oz)  08/22/19 75.7 kg (166 lb 12.8 oz)  02/22/19 74.3 kg (163 lb 12.8 oz)      ASSESSMENT AND PLAN:  1) Chronic diastolic CHF:  - Echo [id number] EF 60-65%, LA severely dilated, Echo 10/2017: EF 65-70%, grade 3 DD - Symptomatically worse. NYHA III. Likely multifactorial - Volume status ok - Continue lasix 80 mg BID.  - Continue KCL 40 bid  2) Persistent atrial fib/flutter - This patients CHA2DS2-VASc Score and unadjusted Ischemic Stroke Rate (% per year) is equal to 7.2 % stroke rate/year from a score of 5 Above score calculated as 2 points each if present [Age > 75, or Stroke/TIA/TE] - Rate controlled on coreg 6.25 mg BID - Continue Eliquis. No bleeding.  3) OSA/COPD - Follows with Dr. Halford Chessman.  - Not wearing CPAP. Not very interested in pursuing a new mask.   4) CAD - H/o of stent in 2008 Last cath 2015 with moderate CAD.  - She is having intermittent chest tightness relieved with NTG.  - Discussed cath vs Lexiscan Myoview. She would like to start with Myoview - Knows to call me with progressive symptoms. - LDL high . Unable to tolerate statins. Refer to lipid clinic for possible PCSK-9 - Start Jardaince  5) HTN - BP is a bit high. Hopefully will improve with Jardiance  6) DM2 - Will start Jardiance   7) HL  - recent lipids with LDL 207. Unable to tolerate Crestor. - Refer to Lipid Clinic    Signed, Glori Bickers, MD  09/19/2019 2:45 PM  Advanced Heart Failure [place] [address] [facility] and [place] [address] [phone] (office) [phone] (fax)

## 2019-09-19 ENCOUNTER — Other Ambulatory Visit: Payer: Self-pay

## 2019-09-19 ENCOUNTER — Ambulatory Visit (HOSPITAL_COMMUNITY)
Admission: RE | Admit: 2019-09-19 | Discharge: 2019-09-19 | Disposition: A | Discharge status: Home / Self Care | Payer: Medicare Other | Source: Ambulatory Visit | Attending: Internal Medicine | Admitting: Internal Medicine

## 2019-09-19 VITALS — BP 141/72 | HR 87 | Wt 165.8 lb

## 2019-09-19 DIAGNOSIS — I4892 Unspecified atrial flutter: Secondary | ICD-10-CM | POA: Insufficient documentation

## 2019-09-19 DIAGNOSIS — Z881 Allergy status to other antibiotic agents status: Secondary | ICD-10-CM | POA: Diagnosis not present

## 2019-09-19 DIAGNOSIS — Z87891 Personal history of nicotine dependence: Secondary | ICD-10-CM | POA: Diagnosis not present

## 2019-09-19 DIAGNOSIS — R6889 Other general symptoms and signs: Secondary | ICD-10-CM | POA: Diagnosis not present

## 2019-09-19 DIAGNOSIS — Z79899 Other long term (current) drug therapy: Secondary | ICD-10-CM | POA: Insufficient documentation

## 2019-09-19 DIAGNOSIS — G4733 Obstructive sleep apnea (adult) (pediatric): Secondary | ICD-10-CM | POA: Insufficient documentation

## 2019-09-19 DIAGNOSIS — Z8249 Family history of ischemic heart disease and other diseases of the circulatory system: Secondary | ICD-10-CM | POA: Diagnosis not present

## 2019-09-19 DIAGNOSIS — Z885 Allergy status to narcotic agent status: Secondary | ICD-10-CM | POA: Insufficient documentation

## 2019-09-19 DIAGNOSIS — Z888 Allergy status to other drugs, medicaments and biological substances status: Secondary | ICD-10-CM | POA: Diagnosis not present

## 2019-09-19 DIAGNOSIS — R079 Chest pain, unspecified: Secondary | ICD-10-CM

## 2019-09-19 DIAGNOSIS — Z96651 Presence of right artificial knee joint: Secondary | ICD-10-CM | POA: Insufficient documentation

## 2019-09-19 DIAGNOSIS — E785 Hyperlipidemia, unspecified: Secondary | ICD-10-CM | POA: Diagnosis not present

## 2019-09-19 DIAGNOSIS — Z955 Presence of coronary angioplasty implant and graft: Secondary | ICD-10-CM | POA: Diagnosis not present

## 2019-09-19 DIAGNOSIS — I5032 Chronic diastolic (congestive) heart failure: Secondary | ICD-10-CM | POA: Diagnosis not present

## 2019-09-19 DIAGNOSIS — Z09 Encounter for follow-up examination after completed treatment for conditions other than malignant neoplasm: Secondary | ICD-10-CM | POA: Diagnosis not present

## 2019-09-19 DIAGNOSIS — Z794 Long term (current) use of insulin: Secondary | ICD-10-CM | POA: Diagnosis not present

## 2019-09-19 DIAGNOSIS — I5042 Chronic combined systolic (congestive) and diastolic (congestive) heart failure: Secondary | ICD-10-CM | POA: Diagnosis not present

## 2019-09-19 DIAGNOSIS — Z791 Long term (current) use of non-steroidal anti-inflammatories (NSAID): Secondary | ICD-10-CM | POA: Diagnosis not present

## 2019-09-19 DIAGNOSIS — I2510 Atherosclerotic heart disease of native coronary artery without angina pectoris: Secondary | ICD-10-CM | POA: Insufficient documentation

## 2019-09-19 DIAGNOSIS — Z7901 Long term (current) use of anticoagulants: Secondary | ICD-10-CM | POA: Insufficient documentation

## 2019-09-19 DIAGNOSIS — E119 Type 2 diabetes mellitus without complications: Secondary | ICD-10-CM | POA: Insufficient documentation

## 2019-09-19 DIAGNOSIS — J45909 Unspecified asthma, uncomplicated: Secondary | ICD-10-CM | POA: Diagnosis not present

## 2019-09-19 DIAGNOSIS — I10 Essential (primary) hypertension: Secondary | ICD-10-CM

## 2019-09-19 DIAGNOSIS — I252 Old myocardial infarction: Secondary | ICD-10-CM | POA: Insufficient documentation

## 2019-09-19 DIAGNOSIS — E782 Mixed hyperlipidemia: Secondary | ICD-10-CM

## 2019-09-19 DIAGNOSIS — Z880 Allergy status to penicillin: Secondary | ICD-10-CM | POA: Insufficient documentation

## 2019-09-19 DIAGNOSIS — I110 Hypertensive heart disease with heart failure: Secondary | ICD-10-CM | POA: Diagnosis not present

## 2019-09-19 DIAGNOSIS — E1122 Type 2 diabetes mellitus with diabetic chronic kidney disease: Secondary | ICD-10-CM

## 2019-09-19 DIAGNOSIS — N183 Chronic kidney disease, stage 3 (moderate): Secondary | ICD-10-CM

## 2019-09-19 MED ORDER — JARDIANCE 10 MG PO TABS: 10.0000 mg | ORAL_TABLET | Freq: Every day | ORAL | 5 refills | Status: DC

## 2019-09-19 NOTE — Patient Instructions (Addendum)
No labs done today.  START Jardiance 10mg  one tablet daily  Your physician recommends that you schedule a follow-up appointment in: 4 months. We will contact you to schedule this appointment.  You have been referred to the [facility]. They will contact you to schedule an appointment.   At the [facility], you and your health needs are our priority. As part of our continuing mission to provide you with exceptional heart care, we have created designated Provider Care Teams. These Care Teams include your primary Cardiologist (physician) and Advanced Practice Providers (APPs- Physician Assistants and Nurse Practitioners) who all work together to provide you with the care you need, when you need it.   You may see any of the following providers on your designated Care Team at your next follow up: Marland Kitchen Dr Glori Bickers . Dr Loralie Champagne . Darrick Grinder, NP   Please be sure to bring in all your medications bottles to every appointment.     How to Prepare for Your Myoview Test (stress test):  1. Please do not take these medications before your test: Lasix and Insulin (please note if this is an exercise test pt should hold beta blocker prior) 2. Your remaining medications may be taken with water. 3. Nothing to eat or drink, except water, 4 hours prior to arrival time.  NO caffeine/decaffeinated products, or chocolate 12 hours prior to arrival. 4. Ladies, please do not wear dresses.  Skirts or pants are approprate, please wear a short sleeve shirt. 5. NO perfume, cologne or lotion 6. Wear comfortable walking shoes.  NO HEELS! 7. Total time is 3 to 4 hours; you may want to bring reading material for the waiting time. 8. Please report to [facility] for your test  What to expect after you arrive:  Once you arrive and check in for your appointment an IV will be started in your arm.  Then the Technoligist will inject a small amount of radioactive tracer.  There will be a 1 hour  waiting period after this injection.  A series of pictures will be taken of your heart following this waiting period.  You will be prepped for the stress portion of the test.  During the stress portion of your test you will either walk on a treadmill or receive a small, safe amount of radioactive tracer injected in your IV.  After the stress portion, there is a short rest period during which time your heart and blood pressure will be monitored.  After the short rest period the Technologist will begin your second set of pictures.  Your doctor will inform you of your test results within 7-10 business days.  In preparation for your appointment, medication and supplies will be purchased.  Appointment availability is limited, so if you need to cancel or reschedule please call the office at [phone] 24 hours in advance to avoid a cancellation fee of $100.00  IF Rose Bud, [place] TECHNOLOGIST.

## 2019-09-20 ENCOUNTER — Telehealth (HOSPITAL_COMMUNITY): Payer: Self-pay

## 2019-09-20 NOTE — Telephone Encounter (Signed)
Received vm from patient asking about test ordered from Dr Haroldine Laws. Left message to return call to discuss

## 2019-09-20 NOTE — Telephone Encounter (Signed)
Spoke with the patient, instructions given. She stated that she would be here for her test. Asked to call back with any questions. S.Vangie Henthorn EMTP 

## 2019-09-21 NOTE — Telephone Encounter (Signed)
LATE ENTRY, spoke with patient explained in detail what tests and referrals that were ordered at time of her visit. Pt verbalized understanding

## 2019-09-25 ENCOUNTER — Ambulatory Visit (HOSPITAL_COMMUNITY): Payer: Medicare Other | Attending: Cardiovascular Disease

## 2019-09-25 ENCOUNTER — Encounter (INDEPENDENT_AMBULATORY_CARE_PROVIDER_SITE_OTHER): Payer: Self-pay

## 2019-09-25 ENCOUNTER — Other Ambulatory Visit: Payer: Self-pay

## 2019-09-25 DIAGNOSIS — R079 Chest pain, unspecified: Secondary | ICD-10-CM | POA: Diagnosis not present

## 2019-09-25 LAB — MYOCARDIAL PERFUSION IMAGING
LV dias vol: 51 mL (ref 46–106)
LV sys vol: 23 mL
Peak HR: 76 {beats}/min
Rest HR: 75 {beats}/min
SDS: 4
SRS: 0
SSS: 4
TID: 1.35

## 2019-09-25 MED ORDER — TECHNETIUM TC 99M TETROFOSMIN IV KIT
31.8000 | PACK | Freq: Once | INTRAVENOUS | Status: AC | PRN
  Administered 2019-09-25: 31.8 via INTRAVENOUS
  Filled 2019-09-25: qty 32

## 2019-09-25 MED ORDER — REGADENOSON 0.4 MG/5ML IV SOLN
0.4000 mg | Freq: Once | INTRAVENOUS | Status: AC
  Administered 2019-09-25: 0.4 mg via INTRAVENOUS

## 2019-09-25 MED ORDER — TECHNETIUM TC 99M TETROFOSMIN IV KIT
10.1000 | PACK | Freq: Once | INTRAVENOUS | Status: AC | PRN
  Administered 2019-09-25: 10.1 via INTRAVENOUS
  Filled 2019-09-25: qty 11

## 2019-09-26 ENCOUNTER — Encounter (HOSPITAL_COMMUNITY): Payer: Medicare Other

## 2019-10-03 ENCOUNTER — Telehealth (HOSPITAL_COMMUNITY): Payer: Self-pay | Admitting: Cardiology

## 2019-10-03 NOTE — Telephone Encounter (Signed)
Patient called to request NM results Results reviewed with patient and nothing further needed at this time, patient very appreciative of returned call

## 2019-10-08 ENCOUNTER — Encounter: Payer: Self-pay | Admitting: Internal Medicine

## 2019-10-08 ENCOUNTER — Ambulatory Visit: Payer: Medicare Other | Admitting: Internal Medicine

## 2019-10-08 ENCOUNTER — Other Ambulatory Visit: Payer: Self-pay

## 2019-10-08 VITALS — BP 141/82 | HR 97 | Ht 63.0 in | Wt 164.2 lb

## 2019-10-08 DIAGNOSIS — E782 Mixed hyperlipidemia: Secondary | ICD-10-CM

## 2019-10-08 DIAGNOSIS — I10 Essential (primary) hypertension: Secondary | ICD-10-CM | POA: Diagnosis not present

## 2019-10-08 DIAGNOSIS — I5032 Chronic diastolic (congestive) heart failure: Secondary | ICD-10-CM | POA: Diagnosis not present

## 2019-10-08 DIAGNOSIS — Z789 Other specified health status: Secondary | ICD-10-CM | POA: Diagnosis not present

## 2019-10-08 NOTE — Patient Instructions (Signed)
Medication Instructions:  Your physician recommends that you continue on your current medications as directed. Please refer to the Current Medication list given to you today.  If you need a refill on your cardiac medications before your next appointment, please call your pharmacy.   Lab work: None ordered If you have labs (blood work) drawn today and your tests are completely normal, you will receive your results only by: [place] (if you have MyChart) OR A paper copy in the mail If you have any lab test that is abnormal or we need to change your treatment, we will call you to review the results.  Testing/Procedures: None ordered  Follow-Up: Dr. Lysbeth Penner nurse will reach out to you concerning starting the [place].

## 2019-10-09 ENCOUNTER — Encounter: Payer: Self-pay | Admitting: Internal Medicine

## 2019-10-09 NOTE — Progress Notes (Signed)
LIPID CLINIC CONSULT NOTE  Chief Complaint:  Manage dyslipidemia  Primary Care Physician: [name], MD  Primary Cardiologist:  No primary care provider on file.  HPI:  [name] is a 85 y.o. female who is being seen today for the evaluation of dyslipidemia at the request of Bensimhon, Shaune Pascal, MD.  This is a pleasant 85 year old female with a history of CAD and a single-vessel stent, hypertension, A. fib/flutter and history of systolic and diastolic heart failure.  She was recently seen by Dr. Ronna Polio for pulmonary hypertension.  She was noted to have a high LDL cholesterol and has been unable to tolerate statins.  She was also started on Jardiance and referred to the lipid clinic for evaluation of PCSK9 inhibitor therapy.  Her most recent labs as of August 2020 showed total cholesterol 290, triglycerides 135, HDL 56 and LDL of 207.  Most of her LDLs have ranged in the 200s, suggesting significant possibility of a familial hyperlipidemia.  In fact there is a family history of heart disease in multiple family members, however her presentation is atypical without aggressive early onset coronary disease is typically 1 would expect.  She does report somewhat of Southern diet with increased cholesterol intake including eating daily eggs, cheese, butter and fish including shellfish and shrimp.  PMHx:  Past Medical History:  Diagnosis Date   Anemia    iron defficiency   Anginal pain ([place])    Arthritis    "knees, feet, hands; joints" (05/27/2015)   Asthma    Atrial fibrillation or flutter    s/p RFCA 7/08;   s/p DCCV in past;   previously on amiodarone;  amio stopped due to lung toxicity   CAD (coronary artery disease)    s/p NSTEMI tx with BMS to OM1 3/08;  cath 3/08: pOM 99% tx with PCI, pLAD 20%, ? mod stenosis at the AM   CAP (community acquired pneumonia) 05/24/2015   Chronic diastolic heart failure ([place])    echo 11/11:  EF 55-60%, severe LVH, mod LAE, mild MR,  mildly increased PASP   COPD (chronic obstructive pulmonary disease) (HCC)    Degenerative joint disease    Dystrophy, corneal stromal    Heart murmur    HLD (hyperlipidemia)    HTN (hypertension)    essential nos   Hypopotassemia    PMH of   Muscle pain    Myocardial infarction ([place]) 2008   OSA on CPAP    Osteoporosis    Pneumonia 05/27/2015   Protein calorie malnutrition (HCC)    Rash and nonspecific skin eruption    both arms,awaiting bio   dr Delman Cheadle   Seasonal allergies    Shortness of breath dyspnea    Type II diabetes mellitus ([place])    Dr Loanne Drilling    Past Surgical History:  Procedure Laterality Date   A FLUTTER ABLATION     Dr Lovena Le   CATARACT EXTRACTION W/ INTRAOCULAR LENS  IMPLANT, BILATERAL Bilateral    CHOLECYSTECTOMY     COLONOSCOPY  6/07   2 polyps Dr. Kinnie Feil in [place]   colonoscopy with polypectomy      X 2; [facility]   CORNEAL TRANSPLANT Bilateral    CORONARY ANGIOPLASTY WITH STENT PLACEMENT  02/2007   BMS w/Dr Lovena Le   EYE SURGERY     gravid 2 para 2     KNEE CARTILAGE SURGERY Right    LEFT AND RIGHT HEART CATHETERIZATION WITH CORONARY ANGIOGRAM N/A 05/07/2014  Procedure: LEFT AND RIGHT HEART CATHETERIZATION WITH CORONARY ANGIOGRAM;  Surgeon: Peter M Martinique, MD;  Location[facility] C[facility] LAB;  Service: Cardiovascular;  Laterality: N/A;   TOTAL KNEE ARTHROPLASTY Right 06/28/2016   Procedure: TOTAL KNEE ARTHROPLASTY;  Surgeon: Frederik Pear, MD;  Location: [facility];  Service: Orthopedics;  Laterality: Right;    FAMHx:  Family History  Problem Relation Age of Onset   Diabetes Mother    Hypertension Mother    Transient ischemic attack Mother    Arthritis Mother    Heart attack Father 71   Arthritis Father    Hypertension Father    Breast cancer Maternal Aunt    Arthritis Maternal Aunt     SOCHx:   reports that she quit smoking about 50 years ago. Her smoking use included cigarettes. She has a 4.50 pack-year smoking history. She  has never used smokeless tobacco. She reports current alcohol use. She reports that she does not use drugs.  ALLERGIES:  Allergies  Allergen Reactions   Amlodipine Besylate Other (See Comments)    REACTION: tingling in lips & gum edema   Levaquin [Levofloxacin] Shortness Of Breath and Swelling    angioedema   Lobster [Shellfish Allergy] Other (See Comments)    angioedema   Penicillins Rash    Has patient had a PCN reaction causing immediate rash, facial/tongue/throat swelling, SOB or lightheadedness with hypotension: Yes Has patient had a PCN reaction causing severe rash involving mucus membranes or skin necrosis: No Has patient had a PCN reaction that required hospitalization: No Has patient had a PCN reaction occurring within the last 10 years: No If all of the above answers are "NO", then may proceed with Cephalosporin use.   Valsartan Other (See Comments)    REACTION: angioedema   Codeine Other (See Comments)    Mental status changes   Lipitor [Atorvastatin] Other (See Comments)    weakness   Oxycodone Other (See Comments)    hallucinations   Statins     Made her too weak   Tramadol    Crestor [Rosuvastatin Calcium] Rash   Tramadol Hcl Nausea And Vomiting    ROS: Pertinent items noted in HPI and remainder of comprehensive ROS otherwise negative.  HOME MEDS: Current Outpatient Medications on File Prior to Visit  Medication Sig Dispense Refill   acetaminophen (TYLENOL) 500 MG tablet Take 1,000 mg by mouth every 6 (six) hours as needed (pain).     CALCIUM-MAG-VIT C-VIT D PO Take 1 tablet by mouth daily.     carvedilol (COREG) 6.25 MG tablet TAKE 1 TABLET BY MOUTH TWO  TIMES DAILY WITH MEALS 180 tablet 3   cholecalciferol (VITAMIN D) 1000 units tablet Take 1,000 Units by mouth daily after supper.     diclofenac sodium (VOLTAREN) 1 % GEL Apply 4 g topically 4 (four) times daily. 100 g 5   ELIQUIS 5 MG TABS tablet TAKE 1 TABLET BY MOUTH TWO  TIMES DAILY 180  tablet 1   empagliflozin (JARDIANCE) 10 MG TABS tablet Take 10 mg by mouth daily before breakfast. 30 tablet 5   furosemide (LASIX) 80 MG tablet TAKE 1 TABLET BY MOUTH TWO  TIMES DAILY 180 tablet 3   glucose blood (ONETOUCH VERIO) test strip Use to monitor glucose levels 2 times daily 200 each 5   insulin NPH-regular Human (NOVOLIN 70/30 RELION) (70-30) 100 UNIT/ML injection Inject 22 Units into the skin daily with breakfast. And syringes 1/day 10 mL 11   Lancets (ONETOUCH ULTRASOFT) lancets Use to  monitor glucose levels 2 times daily 200 each 2   Multiple Vitamin (MULTIVITAMIN WITH MINERALS) TABS tablet Take 1 tablet by mouth daily. Centrum Silver     nitroGLYCERIN (NITROSTAT) 0.4 MG SL tablet Place 1 tablet (0.4 mg total) under the tongue every 5 (five) minutes as needed for chest pain. Reported on 01/28/2016 30 tablet 3   potassium chloride SA (K-DUR) 20 MEQ tablet TAKE 2 TABLETS BY MOUTH  DAILY 180 tablet 3   tiotropium (SPIRIVA HANDIHALER) 18 MCG inhalation capsule Place 1 capsule (18 mcg total) into inhaler and inhale daily. 90 capsule 3   No current facility-administered medications on file prior to visit.     LABS/IMAGING: No results found for this or any previous visit (from the past 48 hour(s)). No results found.  LIPID PANEL:    Component Value Date/Time   CHOL 290 (H) 08/22/2019 1412   TRIG 135.0 08/22/2019 1412   TRIG 52 11/10/2006 0845   HDL 56.10 08/22/2019 1412   CHOLHDL 5 08/22/2019 1412   VLDL 27.0 08/22/2019 1412   LDLCALC 207 (H) 08/22/2019 1412   LDLDIRECT 144.6 02/03/2010 0838    WEIGHTS: Wt Readings from Last 3 Encounters:  10/08/19 164 lb 3.2 oz (74.5 kg)  09/25/19 165 lb (74.8 kg)  09/19/19 165 lb 12.8 oz (75.2 kg)    VITALS: BP (!) 141/82    Pulse 97    Ht 5\' 3"  (1.6 m)    Wt 164 lb 3.2 oz (74.5 kg)    SpO2 93%    BMI 29.09 kg/m   EXAM: General appearance: alert and no distress Neck: no carotid bruit, no JVD and thyroid not enlarged,  symmetric, no tenderness/mass/nodules Lungs: clear to auscultation bilaterally Heart: regular rate and rhythm, S1, S2 normal, no murmur, click, rub or gallop Abdomen: soft, non-tender; bowel sounds normal; no masses,  no organomegaly Extremities: extremities normal, atraumatic, no cyanosis or edema Pulses: 2+ and symmetric Skin: Skin color, texture, turgor normal. No rashes or lesions Neurologic: Grossly normal Psych: Pleasant  EKG: Deferred  ASSESSMENT: 1. Mixed dyslipidemia, LDL greater than 190-possible FH 2. History of coronary disease status post PCI 3. Statin intolerance 4. Type 2 diabetes 5. Hypertension  PLAN: 1.   Ms. Walkup has a mixed dyslipidemia and has been statin intolerant.  Given her prior history of coronary disease her target LDL is less than 70 and with LDL greater than 190 per current guidelines of 50% or more reduction in LDL cholesterol is recommended.  There is a possibility she may have FH however his not manifest with early onset severe coronary disease.  Nonetheless there is a strong family history of heart disease.  I do think she is a good candidate for PCSK9 inhibitor.  We discussed it today and since she is ready using insulin therapy, she is familiar with giving herself injections.  She did take some literature on PCSK9 inhibitors and will contact us if she wishes to move forward with a prescription.  Thanks as always for the kind referral.  Pixie Casino, MD, FACC, [place] Director of the [facility] Lipid Disorders &  Cardiovascular Risk Reduction Clinic Diplomate of the American Board of Clinical Lipidology Attending Cardiologist  Direct Dial: [phone]   Fax: [phone]  Website:  [email or web address] 10/09/2019, 4:55 PM

## 2019-10-10 ENCOUNTER — Other Ambulatory Visit (HOSPITAL_COMMUNITY): Payer: Self-pay

## 2019-10-10 ENCOUNTER — Telehealth: Payer: Self-pay | Admitting: Internal Medicine

## 2019-10-10 MED ORDER — JARDIANCE 10 MG PO TABS: 10.0000 mg | ORAL_TABLET | Freq: Every day | ORAL | 3 refills | Status: DC

## 2019-10-10 NOTE — Telephone Encounter (Signed)
Approved today Request Reference Number: [id number]. REPATHA SURE INJ 140MG /ML is approved through 04/09/2020. For further questions, call [phone].

## 2019-10-10 NOTE — Telephone Encounter (Signed)
PA for repatha sureclick submitted via [email or web address] (Key: [id number])

## 2019-10-10 NOTE — Telephone Encounter (Signed)
Pt called requesing Rx for Jardiance be [facility], rx sent. Pt aware of same.

## 2019-10-11 MED ORDER — REPATHA SURECLICK 140 MG/ML ~~LOC~~ SOAJ: 1.0000 | SUBCUTANEOUS | 3 refills | Status: DC

## 2019-10-11 NOTE — Addendum Note (Signed)
Addended by: Fidel Levy on: 10/11/2019 02:56 PM   Modules accepted: Orders

## 2019-10-11 NOTE — Telephone Encounter (Signed)
Patient aware med has been approved. Patient assistance application for [employer] mailed today. Advised patient of [email or web address] program and provided phone #. Rx(s) sent to pharmacy electronically to [facility] per request

## 2019-10-12 ENCOUNTER — Telehealth (HOSPITAL_COMMUNITY): Payer: Self-pay | Admitting: Pharmacist

## 2019-10-12 ENCOUNTER — Ambulatory Visit (INDEPENDENT_AMBULATORY_CARE_PROVIDER_SITE_OTHER): Payer: Medicare Other | Admitting: *Deleted

## 2019-10-12 DIAGNOSIS — Z0000 Encounter for general adult medical examination without abnormal findings: Secondary | ICD-10-CM

## 2019-10-12 NOTE — Progress Notes (Addendum)
Subjective:   [name] is a 85 y.o. female who presents for Medicare Annual (Subsequent) preventive examination. I connected with patient by a telephone and verified that I am speaking with the correct person using two identifiers. Patient stated full name and DOB. Patient gave permission to continue with telephonic visit. Patient's location was at home and Nurse's location was at [place] office. Participants during this visit included patient and nurse. No referrals ordered during this visit.  Review of Systems:   Cardiac Risk Factors include: advanced age (>55men, >36 women);diabetes mellitus;dyslipidemia;hypertension  Home Safety/Smoke Alarms: Feels safe in home. Smoke alarms in place.  Living environment; residence and Firearm Safety: apartment, no firearms, firearms stored safely. Lives alone, no needs for DME, good support system Seat Belt Safety/Bike Helmet: Wears seat belt.     Objective:     Vitals: There were no vitals taken for this visit.  There is no height or weight on file to calculate BMI.  Advanced Directives 10/12/2019 08/25/2018 07/31/2018 12/12/2017 06/06/2017 05/22/2017 05/21/2017  Does Patient Have a Medical Advance Directive? Yes Yes Yes Yes Yes Yes No  Type of Paramedic of [place];Living will [place];Living will [place];Living will - [place];Living will [place];Living will -  Does patient want to make changes to medical advance directive? - - - - Yes (MAU/Ambulatory/Procedural Areas - Information given) No - Patient declined -  Copy of [place] in Chart? Yes - validated most recent copy scanned in chart (See row information) - No - copy requested - No - copy requested No - copy requested -  Would patient like information on creating a medical advance directive? - - - - - - -    Tobacco Social History   Tobacco Use  Smoking  Status Former Smoker  . Packs/day: 0.25  . Years: 18.00  . Pack years: 4.50  . Types: Cigarettes  . Quit date: 12/27/1968  . Years since quitting: 50.8  Smokeless Tobacco Never Used  Tobacco Comment   smoked [place], up to 1 pp week     Counseling given: Not Answered Comment: smoked [place], up to 1 pp week  Past Medical History:  Diagnosis Date  . Anemia    iron defficiency  . Anginal pain ([place])   . Arthritis    "knees, feet, hands; joints" (05/27/2015)  . Asthma   . Atrial fibrillation or flutter    s/p RFCA 7/08;   s/p DCCV in past;   previously on amiodarone;  amio stopped due to lung toxicity  . CAD (coronary artery disease)    s/p NSTEMI tx with BMS to OM1 3/08;  cath 3/08: pOM 99% tx with PCI, pLAD 20%, ? mod stenosis at the AM  . CAP (community acquired pneumonia) 05/24/2015  . Chronic diastolic heart failure (HCC)    echo 11/11:  EF 55-60%, severe LVH, mod LAE, mild MR, mildly increased PASP  . COPD (chronic obstructive pulmonary disease) ([place])   . Degenerative joint disease   . Dystrophy, corneal stromal   . Heart murmur   . HLD (hyperlipidemia)   . HTN (hypertension)    essential nos  . Hypopotassemia    PMH of  . Muscle pain   . Myocardial infarction ([place]) 2008  . OSA on CPAP   . Osteoporosis   . Pneumonia 05/27/2015  . Protein calorie malnutrition ([place])   . Rash and nonspecific skin eruption  both arms,awaiting bio   dr Delman Cheadle  . Seasonal allergies   . Shortness of breath dyspnea   . Type II diabetes mellitus ([place])    Dr Loanne Drilling   Past Surgical History:  Procedure Laterality Date  . A FLUTTER ABLATION     Dr Lovena Le  . CATARACT EXTRACTION W/ INTRAOCULAR LENS  IMPLANT, BILATERAL Bilateral   . CHOLECYSTECTOMY    . COLONOSCOPY  6/07   2 polyps Dr. Kinnie Feil in [place]  . colonoscopy with polypectomy      X 2; [facility]  . CORNEAL TRANSPLANT Bilateral   . CORONARY ANGIOPLASTY WITH STENT PLACEMENT  02/2007   BMS w/Dr Lovena Le  . EYE SURGERY    . gravid  2 para 2    . KNEE CARTILAGE SURGERY Right   . LEFT AND RIGHT HEART CATHETERIZATION WITH CORONARY ANGIOGRAM N/A 05/07/2014   Procedure: LEFT AND RIGHT HEART CATHETERIZATION WITH CORONARY ANGIOGRAM;  Surgeon: Peter M Martinique, MD;  Location[facility] CATH LAB;  Service: Cardiovascular;  Laterality: N/A;  . TOTAL KNEE ARTHROPLASTY Right 06/28/2016   Procedure: TOTAL KNEE ARTHROPLASTY;  Surgeon: Frederik Pear, MD;  Location: [facility];  Service: Orthopedics;  Laterality: Right;   Family History  Problem Relation Age of Onset  . Diabetes Mother   . Hypertension Mother   . Transient ischemic attack Mother   . Arthritis Mother   . Heart attack Father 78  . Arthritis Father   . Hypertension Father   . Breast cancer Maternal Aunt   . Arthritis Maternal Aunt    Social History   Socioeconomic History  . Marital status: Widowed    Spouse name: Not on file  . Number of children: 2  . Years of education: Not on file  . Highest education level: Not on file  Occupational History  . 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 Financial resource strain: Not hard at all  . Food insecurity    Worry: Never true    Inability: Never true  . Transportation needs    Medical: No    Non-medical: No  Tobacco Use  . Smoking status: Former Smoker    Packs/day: 0.25    Years: 18.00    Pack years: 4.50    Types: Cigarettes    Quit date: 12/27/1968    Years since quitting: 50.8  . Smokeless tobacco: Never Used  . Tobacco comment: smoked [place], up to 1 pp week  Substance and Sexual Activity  . Alcohol use: Yes    Comment: occ  . Drug use: No  . Sexual activity: Never  Lifestyle  . Physical activity    Days per week: 4 days    Minutes per session: 30 min  . Stress: Only a little  Relationships  . Social connections    Talks on phone: More than three times a week    Gets together: More than three times a week    Attends religious service: 1 to 4 times per year    Active member of club or organization:  Yes    Attends meetings of clubs or organizations: 1 to 4 times per year    Relationship status: Widowed  Other Topics Concern  . Not on file  Social History Narrative   Teacher. Widowed. Rarely drinks cafeine.     Outpatient Encounter Medications as of 10/12/2019  Medication Sig  . acetaminophen (TYLENOL) 500 MG tablet Take 1,000 mg by mouth every 6 (six) hours as needed (pain).  Marland Kitchen  CALCIUM-MAG-VIT C-VIT D PO Take 1 tablet by mouth daily.  . carvedilol (COREG) 6.25 MG tablet TAKE 1 TABLET BY MOUTH TWO  TIMES DAILY WITH MEALS  . cholecalciferol (VITAMIN D) 1000 units tablet Take 1,000 Units by mouth daily after supper.  . diclofenac sodium (VOLTAREN) 1 % GEL Apply 4 g topically 4 (four) times daily.  Marland Kitchen ELIQUIS 5 MG TABS tablet TAKE 1 TABLET BY MOUTH TWO  TIMES DAILY  . empagliflozin (JARDIANCE) 10 MG TABS tablet Take 10 mg by mouth daily before breakfast.  . Evolocumab (REPATHA SURECLICK) XX123456 MG/ML SOAJ Inject 1 Dose into the skin every 14 (fourteen) days.  . furosemide (LASIX) 80 MG tablet TAKE 1 TABLET BY MOUTH TWO  TIMES DAILY  . glucose blood (ONETOUCH VERIO) test strip Use to monitor glucose levels 2 times daily  . insulin NPH-regular Human (NOVOLIN 70/30 RELION) (70-30) 100 UNIT/ML injection Inject 22 Units into the skin daily with breakfast. And syringes 1/day  . Lancets (ONETOUCH ULTRASOFT) lancets Use to monitor glucose levels 2 times daily  . Multiple Vitamin (MULTIVITAMIN WITH MINERALS) TABS tablet Take 1 tablet by mouth daily. Centrum Silver  . nitroGLYCERIN (NITROSTAT) 0.4 MG SL tablet Place 1 tablet (0.4 mg total) under the tongue every 5 (five) minutes as needed for chest pain. Reported on 01/28/2016  . potassium chloride SA (K-DUR) 20 MEQ tablet TAKE 2 TABLETS BY MOUTH  DAILY  . tiotropium (SPIRIVA HANDIHALER) 18 MCG inhalation capsule Place 1 capsule (18 mcg total) into inhaler and inhale daily.   No facility-administered encounter medications on file as of 10/12/2019.      Activities of Daily Living In your present state of health, do you have any difficulty performing the following activities: 10/12/2019  Hearing? N  Vision? N  Difficulty concentrating or making decisions? N  Walking or climbing stairs? N  Dressing or bathing? N  Doing errands, shopping? N  Preparing Food and eating ? N  Using the Toilet? N  In the past six months, have you accidently leaked urine? N  Do you have problems with loss of bowel control? N  Managing your Medications? N  Managing your Finances? N  Housekeeping or managing your Housekeeping? N  Some recent data might be hidden    Patient Care Team: [name], MD as PCP - General (Internal Medicine) Chesley Mires, MD as Consulting Physician (Pulmonary Disease) Renato Shin, MD as Consulting Physician (Endocrinology) Bensimhon, Shaune Pascal, MD as Consulting Physician (Cardiology)    Assessment:   This is a routine wellness examination for [place]. Physical assessment deferred to PCP.   Exercise Activities and Dietary recommendations Current Exercise Habits: Home exercise routine, Type of exercise: walking;calisthenics, Time (Minutes): 30, Frequency (Times/Week): 5, Weekly Exercise (Minutes/Week): 150, Intensity: Mild, Exercise limited by: orthopedic condition(s) Diet (meal preparation, eat out, water intake, caffeinated beverages, dairy products, fruits and vegetables): in general, a "healthy" diet  , well balanced  eats a variety of fruits and vegetables daily,  Reviewed heart healthy and diabetic diet. Encouraged patient to increase daily water and healthy fluid intake.  Goals    . maintain current health status     Continue to exercise and eat healthy.    . Patient Stated     I want to work towards getting my HGB A1c lower by continuing to eat healthy and exercise. Worship God, love family, and life.       Fall Risk Fall Risk  10/12/2019 07/31/2018 12/12/2017 11/01/2017 06/06/2017  Falls in the  past year? 0 No  No No No  Number falls in past yr: 0 - - - -  Injury with Fall? 1 - - - -  Comment - - - - -  Risk for fall due to : Impaired balance/gait Impaired balance/gait;Impaired mobility - - History of fall(s)  Risk for fall due to: Comment - - - - -   Is the patient's home free of loose throw rugs in walkways, pet beds, electrical cords, etc?   yes      Grab bars in the bathroom? yes      Handrails on the stairs?   yes      Adequate lighting?   yes   Depression Screen PHQ 2/9 Scores 10/12/2019 08/25/2018 07/31/2018 12/12/2017  PHQ - 2 Score 1 0 0 0     Cognitive Function MMSE - Mini Mental State Exam 10/12/2019 07/31/2018  Not completed: Refused -  Orientation to time - 5  Orientation to Place - 5  Registration - 3  Attention/ Calculation - 5  Recall - 2  Language- name 2 objects - 2  Language- repeat - 1  Language- follow 3 step command - 3  Language- read & follow direction - 1  Write a sentence - 1  Copy design - 1  Total score - 29       Ad8 score reviewed for issues:  Issues making decisions: no  Less interest in hobbies / activities: no  Repeats questions, stories (family complaining): no  Trouble using ordinary gadgets (microwave, computer, phone):no  Forgets the month or year: no  Mismanaging finances: no  Remembering appts: no  Daily problems with thinking and/or memory: no Ad8 score is= 0  Immunization History  Administered Date(s) Administered  . Fluad Quad(high Dose 65+) 08/22/2019  . Influenza Split 11/16/2011, 09/26/2012  . Influenza Whole 12/27/2004, 10/21/2008, 10/20/2009, 11/11/2010  . Influenza, High Dose Seasonal PF 10/14/2016, 10/10/2017, 09/28/2018  . Influenza,inj,Quad PF,6+ Mos 09/24/2013, 12/16/2014, 09/19/2015  . PPD Test 05/31/2015, 07/07/2016  . Pneumococcal Conjugate-13 07/31/2018  . Pneumococcal Polysaccharide-23 12/16/2014  . Pneumococcal-Unspecified 05/31/2015  . Td 06/15/2010   Screening Tests Health Maintenance  Topic Date Due   . OPHTHALMOLOGY EXAM  06/06/2019  . URINE MICROALBUMIN  08/01/2019  . DEXA SCAN  10/11/2020 (Originally 02/16/2019)  . HEMOGLOBIN A1C  02/22/2020  . FOOT EXAM  02/23/2020  . TETANUS/TDAP  06/15/2020  . INFLUENZA VACCINE  Completed  . PNA vac Low Risk Adult  Completed       Plan:    Reviewed health maintenance screenings with patient today and relevant education, vaccines, and/or referrals were provided.   Continue to eat heart healthy diet (full of fruits, vegetables, whole grains, lean protein, water--limit salt, fat, and sugar intake) and increase physical activity as tolerated.  Continue doing brain stimulating activities (puzzles, reading, adult coloring books, staying active) to keep memory sharp.   I have personally reviewed and noted the following in the patient's chart:   . Medical and social history . Use of alcohol, tobacco or illicit drugs  . Current medications and supplements . Functional ability and status . Nutritional status . Physical activity . Advanced directives . List of other physicians . Hospitalizations, surgeries, and ER visits in previous 12 months . Vitals . Screenings to include cognitive, depression, and falls . Referrals and appointments  In addition, I have reviewed and discussed with patient certain preventive protocols, quality metrics, and best practice recommendations. A written personalized care plan for preventive services  as well as general preventive health recommendations were provided to patient.     Michiel Cowboy, RN  10/12/2019    Medical screening examination/treatment/procedure(s) were performed by non-physician practitioner and as supervising physician I was immediately available for consultation/collaboration. I agree with above. [name], MD

## 2019-10-12 NOTE — Telephone Encounter (Signed)
Patient called and said she was having issues affording her new Jardiance. Her copay is >$400.00 since she is in the doughnut hole. I have started application for Manufacturer's Assistance through [facility] program. Will mail application to patient today and submit once form is signed.   Audry Riles, PharmD, BCPS, CPP Heart Failure Clinic Pharmacist [phone]

## 2019-10-19 NOTE — Telephone Encounter (Signed)
Sent in [employer] application to [employer] for [employer].   Phone: [phone] Fax: [phone]  Application pending, will continue to follow.  Audry Riles, PharmD, BCPS, CPP Heart Failure Clinic Pharmacist [phone]

## 2019-10-23 NOTE — Telephone Encounter (Signed)
Called patient per message from [facility]. Patient answered phone then hung up accidentally. Attempted to call patient back, went to VM. Message left.

## 2019-10-24 MED ORDER — REPATHA SURECLICK 140 MG/ML ~~LOC~~ SOAJ: 1.0000 | SUBCUTANEOUS | 11 refills | Status: DC

## 2019-10-24 NOTE — Telephone Encounter (Signed)
Spoke with patient who reports she has been approved for copay assistance thru healthwellfoundation. She needs Rx sent to [facility]. Rx(s) sent to pharmacy electronically.  Scheduled lipid clinic follow up on Jan 30, 2020

## 2019-10-24 NOTE — Addendum Note (Signed)
Addended by: Fidel Levy on: 10/24/2019 02:39 PM   Modules accepted: Orders

## 2019-10-26 ENCOUNTER — Other Ambulatory Visit (HOSPITAL_COMMUNITY): Payer: Self-pay

## 2019-10-26 MED ORDER — JARDIANCE 10 MG PO TABS: 10.0000 mg | ORAL_TABLET | Freq: Every day | ORAL | 3 refills | Status: DC

## 2019-10-29 NOTE — Telephone Encounter (Signed)
Advanced Heart Failure Patient Advocate Encounter   Patient was approved to receive Jardiance from [place] [employer]).  Of note, patient was already approved for [facility] for Spiriva for year 2020. New prescription for Jardiance sent to [facility] today, so she can also get that medication from the company for the rest of this year.    She is also now also approved for [facility] Cares 01/17/2020-12/26/2020.  Audry Riles, PharmD, BCPS, CPP Heart Failure Clinic Pharmacist [phone]

## 2019-11-05 ENCOUNTER — Telehealth: Payer: Self-pay | Admitting: Internal Medicine

## 2019-11-05 NOTE — Telephone Encounter (Signed)
tiotropium (SPIRIVA HANDIHALER) 18 MCG inhalation capsule     Patient requesting new rx sent to patient assistance program.    Fax# [phone]

## 2019-11-06 MED ORDER — SPIRIVA HANDIHALER 18 MCG IN CAPS: 18.0000 ug | ORAL_CAPSULE | Freq: Every day | RESPIRATORY_TRACT | 1 refills | Status: DC

## 2019-11-06 NOTE — Telephone Encounter (Signed)
Rx faxed

## 2019-11-14 ENCOUNTER — Encounter: Payer: Self-pay | Admitting: Endocrinology

## 2019-11-14 ENCOUNTER — Ambulatory Visit (INDEPENDENT_AMBULATORY_CARE_PROVIDER_SITE_OTHER): Payer: Medicare Other | Admitting: Endocrinology

## 2019-11-14 ENCOUNTER — Other Ambulatory Visit: Payer: Self-pay

## 2019-11-14 VITALS — BP 150/80 | HR 76 | Ht 63.0 in | Wt 167.6 lb

## 2019-11-14 DIAGNOSIS — N183 Chronic kidney disease, stage 3 unspecified: Secondary | ICD-10-CM | POA: Diagnosis not present

## 2019-11-14 DIAGNOSIS — E1121 Type 2 diabetes mellitus with diabetic nephropathy: Secondary | ICD-10-CM | POA: Diagnosis not present

## 2019-11-14 DIAGNOSIS — N1831 Chronic kidney disease, stage 3a: Secondary | ICD-10-CM | POA: Diagnosis not present

## 2019-11-14 DIAGNOSIS — R6889 Other general symptoms and signs: Secondary | ICD-10-CM | POA: Diagnosis not present

## 2019-11-14 DIAGNOSIS — E1122 Type 2 diabetes mellitus with diabetic chronic kidney disease: Secondary | ICD-10-CM | POA: Diagnosis not present

## 2019-11-14 DIAGNOSIS — E119 Type 2 diabetes mellitus without complications: Secondary | ICD-10-CM

## 2019-11-14 DIAGNOSIS — Z794 Long term (current) use of insulin: Secondary | ICD-10-CM

## 2019-11-14 LAB — POCT GLYCOSYLATED HEMOGLOBIN (HGB A1C): Hemoglobin A1C: 7.9 % — AB (ref 4.0–5.6)

## 2019-11-14 MED ORDER — NOVOLIN 70/30 RELION (70-30) 100 UNIT/ML ~~LOC~~ SUSP: 20.0000 [IU] | Freq: Every day | SUBCUTANEOUS | 11 refills | Status: DC

## 2019-11-14 NOTE — Patient Instructions (Addendum)
Your blood pressure is high today.  Please see your heart doctor soon, to have it rechecked.   Please continue the same insulin.   On this type of insulin schedule, you should eat meals on a regular schedule.  If a meal is missed or significantly delayed, your blood sugar could go low. check your blood sugar twice a day.  vary the time of day when you check, between before the 3 meals, and at bedtime.  also check if you have symptoms of your blood sugar being too high or too low.  please keep a record of the readings and bring it to your next appointment here (or you can bring the meter itself).  You can write it on any piece of paper.  please call us sooner if your blood sugar goes below 70, or if you have a lot of readings over 200. Please come back for a follow-up appointment in 3-4 months.

## 2019-11-14 NOTE — Progress Notes (Signed)
Subjective:    Patient ID: [name], female    DOB: May 02, 1931, 85 y.o.   MRN: [id number]  HPI Pt returns for f/u of diabetes mellitus:  DM type: Insulin-requiring type 2.   Dx'ed: [id number] Complications: CAD, polyneuropathy, and renal insufficiency.  Therapy: insulin since 2007, and Jardiance.   GDM: never.  DKA: never. Severe hypoglycemia: never.   Pancreatitis: never.   Other: she changed to qd insulin, after poor results with multiple daily injections; she was changed from lantus to QAM NPH, then to 70/30, due to am hypoglycemia and PM hyperglycemia.   Interval history: she brings a record of her cbg's which I have reviewed today.  cbg varies from 102-183, but most are in the mid-100's.  There is no trend throughout the day.   Pt also has hyperthyroidism (dx'ed 2012; [place] then showed multinodular goiter; tapazole was chosen as rx, due to low uptake on nuc med scan; tapazole was stopped, due to rash; plan will be for RAI when this recurs off rx).   Past Medical History:  Diagnosis Date  . Anemia    iron defficiency  . Anginal pain ([place])   . Arthritis    "knees, feet, hands; joints" (05/27/2015)  . Asthma   . Atrial fibrillation or flutter    s/p RFCA 7/08;   s/p DCCV in past;   previously on amiodarone;  amio stopped due to lung toxicity  . CAD (coronary artery disease)    s/p NSTEMI tx with BMS to OM1 3/08;  cath 3/08: pOM 99% tx with PCI, pLAD 20%, ? mod stenosis at the AM  . CAP (community acquired pneumonia) 05/24/2015  . Chronic diastolic heart failure (HCC)    echo 11/11:  EF 55-60%, severe LVH, mod LAE, mild MR, mildly increased PASP  . COPD (chronic obstructive pulmonary disease) ([place])   . Degenerative joint disease   . Dystrophy, corneal stromal   . Heart murmur   . HLD (hyperlipidemia)   . HTN (hypertension)    essential nos  . Hypopotassemia    PMH of  . Muscle pain   . Myocardial infarction ([place]) 2008  . OSA on CPAP   . Osteoporosis   . Pneumonia  05/27/2015  . Protein calorie malnutrition ([place])   . Rash and nonspecific skin eruption    both arms,awaiting bio   dr Delman Cheadle  . Seasonal allergies   . Shortness of breath dyspnea   . Type II diabetes mellitus ([place])    Dr Loanne Drilling    Past Surgical History:  Procedure Laterality Date  . A FLUTTER ABLATION     Dr Lovena Le  . CATARACT EXTRACTION W/ INTRAOCULAR LENS  IMPLANT, BILATERAL Bilateral   . CHOLECYSTECTOMY    . COLONOSCOPY  6/07   2 polyps Dr. Kinnie Feil in [place]  . colonoscopy with polypectomy      X 2; [facility]  . CORNEAL TRANSPLANT Bilateral   . CORONARY ANGIOPLASTY WITH STENT PLACEMENT  02/2007   BMS w/Dr Lovena Le  . EYE SURGERY    . gravid 2 para 2    . KNEE CARTILAGE SURGERY Right   . LEFT AND RIGHT HEART CATHETERIZATION WITH CORONARY ANGIOGRAM N/A 05/07/2014   Procedure: LEFT AND RIGHT HEART CATHETERIZATION WITH CORONARY ANGIOGRAM;  Surgeon: Peter M Martinique, MD;  Location: [facility] CATH LAB;  Service: Cardiovascular;  Laterality: N/A;  . TOTAL KNEE ARTHROPLASTY Right 06/28/2016   Procedure: TOTAL KNEE ARTHROPLASTY;  Surgeon: Frederik Pear, MD;  Location: [facility];  Service: Orthopedics;  Laterality: Right;    Social History   Socioeconomic History  . Marital status: Widowed    Spouse name: Not on file  . Number of children: 2  . Years of education: Not on file  . Highest education level: Not on file  Occupational History  . 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 Financial resource strain: Not hard at all  . Food insecurity    Worry: Never true    Inability: Never true  . Transportation needs    Medical: No    Non-medical: No  Tobacco Use  . Smoking status: Former Smoker    Packs/day: 0.25    Years: 18.00    Pack years: 4.50    Types: Cigarettes    Quit date: 12/27/1968    Years since quitting: 50.9  . Smokeless tobacco: Never Used  . Tobacco comment: smoked [place], up to 1 pp week  Substance and Sexual Activity  . Alcohol use: Yes    Comment: occ  . Drug use:  No  . Sexual activity: Never  Lifestyle  . Physical activity    Days per week: 4 days    Minutes per session: 30 min  . Stress: Only a little  Relationships  . Social connections    Talks on phone: More than three times a week    Gets together: More than three times a week    Attends religious service: 1 to 4 times per year    Active member of club or organization: Yes    Attends meetings of clubs or organizations: 1 to 4 times per year    Relationship status: Widowed  . Intimate partner violence    Fear of current or ex partner: Not on file    Emotionally abused: Not on file    Physically abused: Not on file    Forced sexual activity: Not on file  Other Topics Concern  . Not on file  Social History Narrative   Teacher. Widowed. Rarely drinks cafeine.     Current Outpatient Medications on File Prior to Visit  Medication Sig Dispense Refill  . acetaminophen (TYLENOL) 500 MG tablet Take 1,000 mg by mouth every 6 (six) hours as needed (pain).    . CALCIUM-MAG-VIT C-VIT D PO Take 1 tablet by mouth daily.    . carvedilol (COREG) 6.25 MG tablet TAKE 1 TABLET BY MOUTH TWO  TIMES DAILY WITH MEALS 180 tablet 3  . cholecalciferol (VITAMIN D) 1000 units tablet Take 1,000 Units by mouth daily after supper.    . diclofenac sodium (VOLTAREN) 1 % GEL Apply 4 g topically 4 (four) times daily. 100 g 5  . ELIQUIS 5 MG TABS tablet TAKE 1 TABLET BY MOUTH TWO  TIMES DAILY 180 tablet 1  . empagliflozin (JARDIANCE) 10 MG TABS tablet Take 10 mg by mouth daily before breakfast. 90 tablet 3  . Evolocumab (REPATHA SURECLICK) XX123456 MG/ML SOAJ Inject 1 Dose into the skin every 14 (fourteen) days. 2 pen 11  . furosemide (LASIX) 80 MG tablet TAKE 1 TABLET BY MOUTH TWO  TIMES DAILY 180 tablet 3  . glucose blood (ONETOUCH VERIO) test strip Use to monitor glucose levels 2 times daily 200 each 5  . Lancets (ONETOUCH ULTRASOFT) lancets Use to monitor glucose levels 2 times daily 200 each 2  . Multiple Vitamin  (MULTIVITAMIN WITH MINERALS) TABS tablet Take 1 tablet by mouth daily. Centrum Silver    . nitroGLYCERIN (NITROSTAT) 0.4 MG SL tablet  Place 1 tablet (0.4 mg total) under the tongue every 5 (five) minutes as needed for chest pain. Reported on 01/28/2016 30 tablet 3  . potassium chloride SA (K-DUR) 20 MEQ tablet TAKE 2 TABLETS BY MOUTH  DAILY 180 tablet 3  . tiotropium (SPIRIVA HANDIHALER) 18 MCG inhalation capsule Place 1 capsule (18 mcg total) into inhaler and inhale daily. 90 capsule 1   No current facility-administered medications on file prior to visit.     Allergies  Allergen Reactions  . Amlodipine Besylate Other (See Comments)    REACTION: tingling in lips & gum edema  . Levaquin [Levofloxacin] Shortness Of Breath and Swelling    angioedema  . Lobster [Shellfish Allergy] Other (See Comments)    angioedema  . Penicillins Rash    Has patient had a PCN reaction causing immediate rash, facial/tongue/throat swelling, SOB or lightheadedness with hypotension: Yes Has patient had a PCN reaction causing severe rash involving mucus membranes or skin necrosis: No Has patient had a PCN reaction that required hospitalization: No Has patient had a PCN reaction occurring within the last 10 years: No If all of the above answers are "NO", then may proceed with Cephalosporin use.  . Valsartan Other (See Comments)    REACTION: angioedema  . Codeine Other (See Comments)    Mental status changes  . Lipitor [Atorvastatin] Other (See Comments)    weakness  . Oxycodone Other (See Comments)    hallucinations  . Statins     Made her too weak  . Tramadol   . Crestor [Rosuvastatin Calcium] Rash  . Tramadol Hcl Nausea And Vomiting    Family History  Problem Relation Age of Onset  . Diabetes Mother   . Hypertension Mother   . Transient ischemic attack Mother   . Arthritis Mother   . Heart attack Father 76  . Arthritis Father   . Hypertension Father   . Breast cancer Maternal Aunt   . Arthritis  Maternal Aunt     BP (!) 150/80 (BP Location: Left Arm, Patient Position: Sitting, Cuff Size: Normal)   Pulse 76   Ht 5\' 3"  (1.6 m)   Wt 167 lb 9.6 oz (76 kg)   SpO2 98%   BMI 29.69 kg/m    Review of Systems She denies hypoglycemia    Objective:   Physical Exam VITAL SIGNS:  See vs page GENERAL: no distress Pulses: dorsalis pedis intact bilat.   MSK: no deformity of the feet.  CV: no leg edema.   Skin:  no ulcer on the feet.  normal color and temp on the feet.  Neuro: sensation is intact to touch on the feet.   Lab Results  Component Value Date   HGBA1C 7.9 (A) 11/14/2019   Lab Results  Component Value Date   CREATININE 1.18 01/31/2019   BUN 26 (H) 01/31/2019   NA 137 01/31/2019   K 4.3 01/31/2019   CL 97 01/31/2019   CO2 27 01/31/2019      Assessment & Plan:  HTN: is noted today. Insulin-requiring type 2 DM, with CAD: this is the best control this pt should aim for, given this regimen, which does match insulin to her changing needs throughout the day.  Frail elderly state: in this setting, she is not a candidate for aggressive glycemic control.   Patient Instructions  Your blood pressure is high today.  Please see your heart doctor soon, to have it rechecked.   Please continue the same insulin.   On this  type of insulin schedule, you should eat meals on a regular schedule.  If a meal is missed or significantly delayed, your blood sugar could go low. check your blood sugar twice a day.  vary the time of day when you check, between before the 3 meals, and at bedtime.  also check if you have symptoms of your blood sugar being too high or too low.  please keep a record of the readings and bring it to your next appointment here (or you can bring the meter itself).  You can write it on any piece of paper.  please call us sooner if your blood sugar goes below 70, or if you have a lot of readings over 200. Please come back for a follow-up appointment in 3-4 months.

## 2019-11-19 ENCOUNTER — Other Ambulatory Visit (HOSPITAL_COMMUNITY): Payer: Self-pay | Admitting: Internal Medicine

## 2019-12-16 IMAGING — MG DIGITAL SCREENING BILATERAL MAMMOGRAM WITH TOMO AND CAD
8 series · 8 of 24 positions shown · non-contrast
Comparison: Previous exam(s).

CLINICAL DATA: Screening.

EXAM:
DIGITAL SCREENING BILATERAL MAMMOGRAM WITH TOMO AND CAD

[R MLO synth-2D]
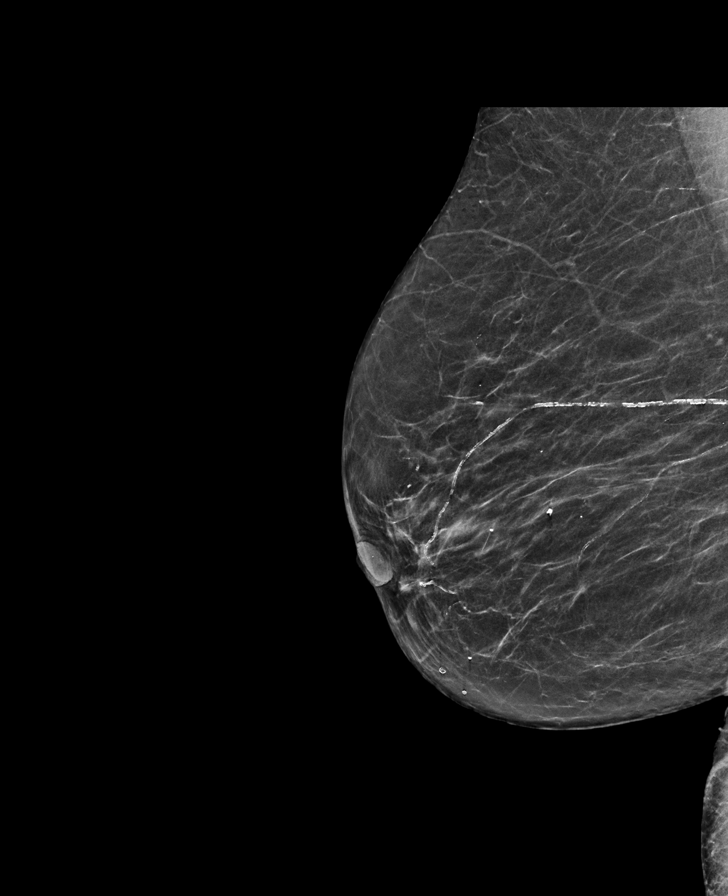

[L MLO synth-2D]
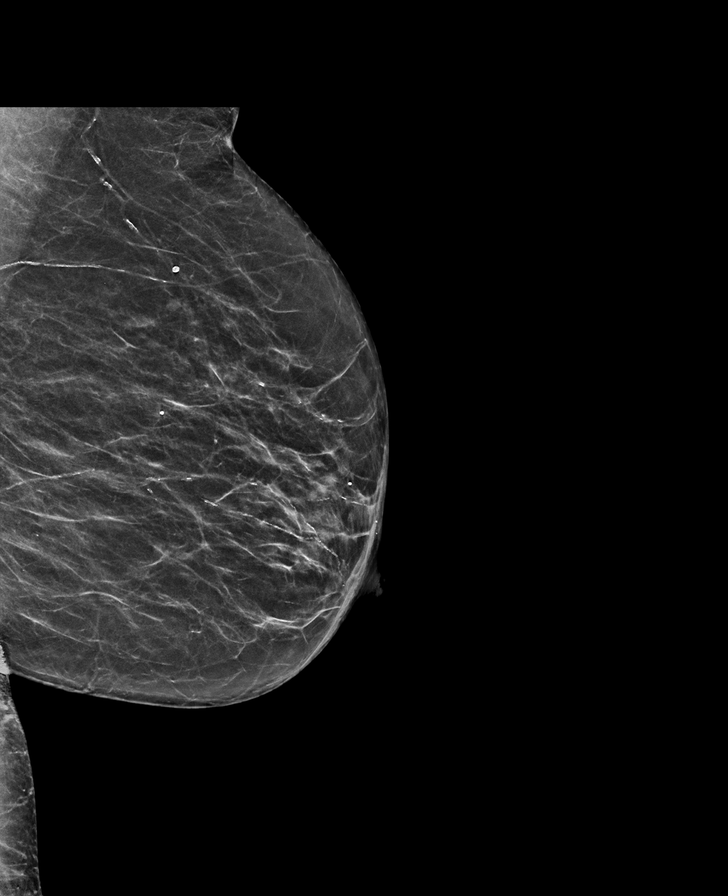

[R CC synth-2D]
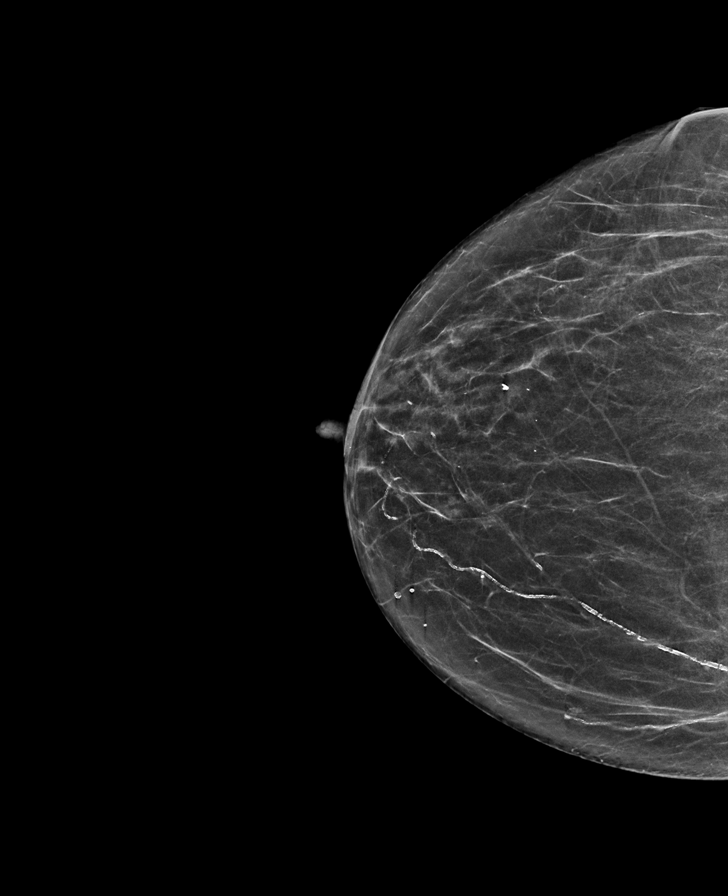

[L CC synth-2D]
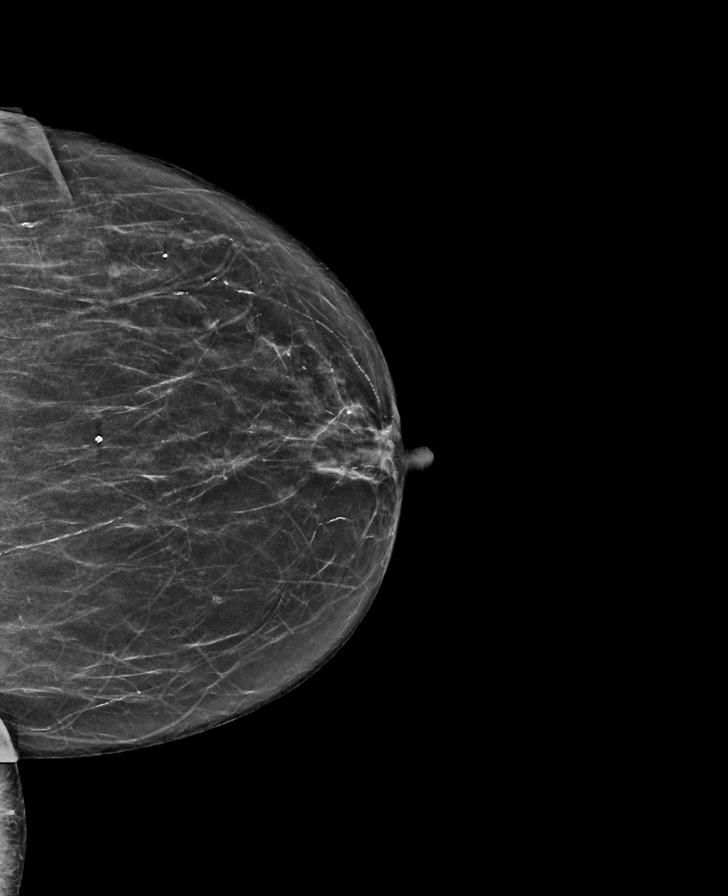

[L CC tomo · tomo slice 29/56.0]
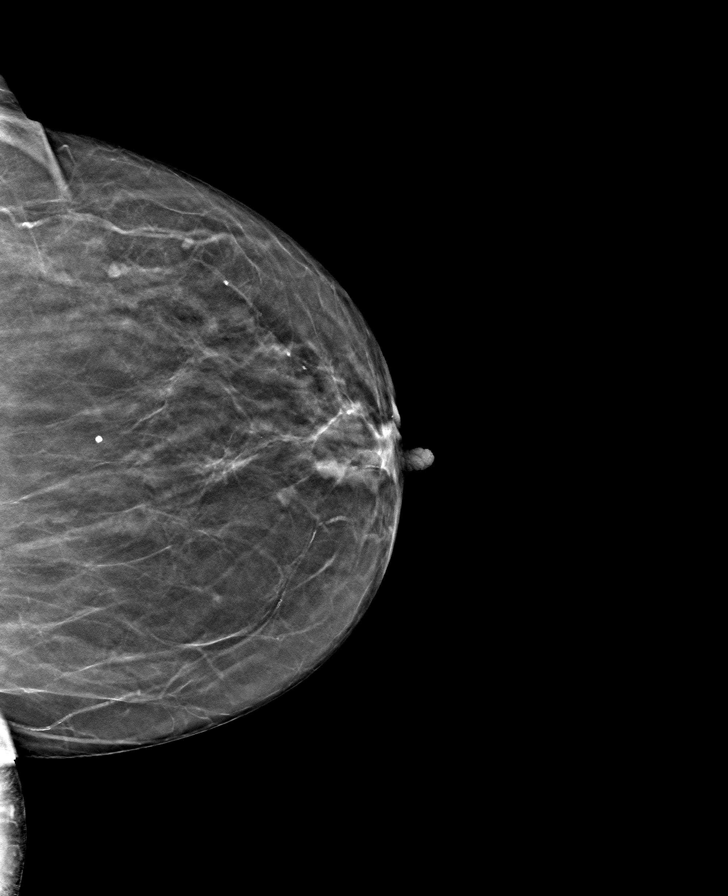

[L MLO tomo · tomo slice 29/58.0]
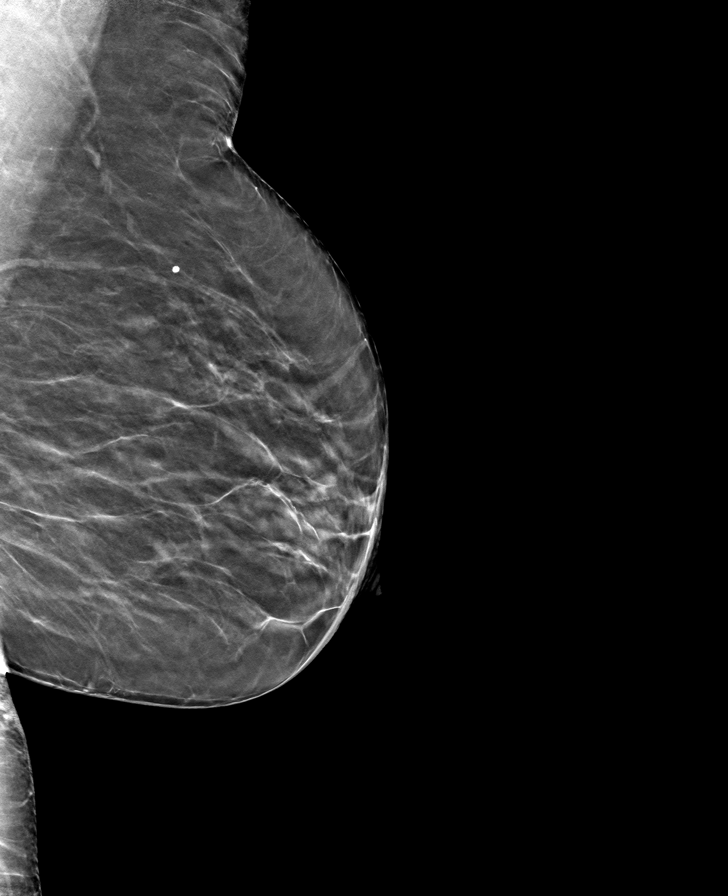

[R MLO tomo · tomo slice 29/56.0]
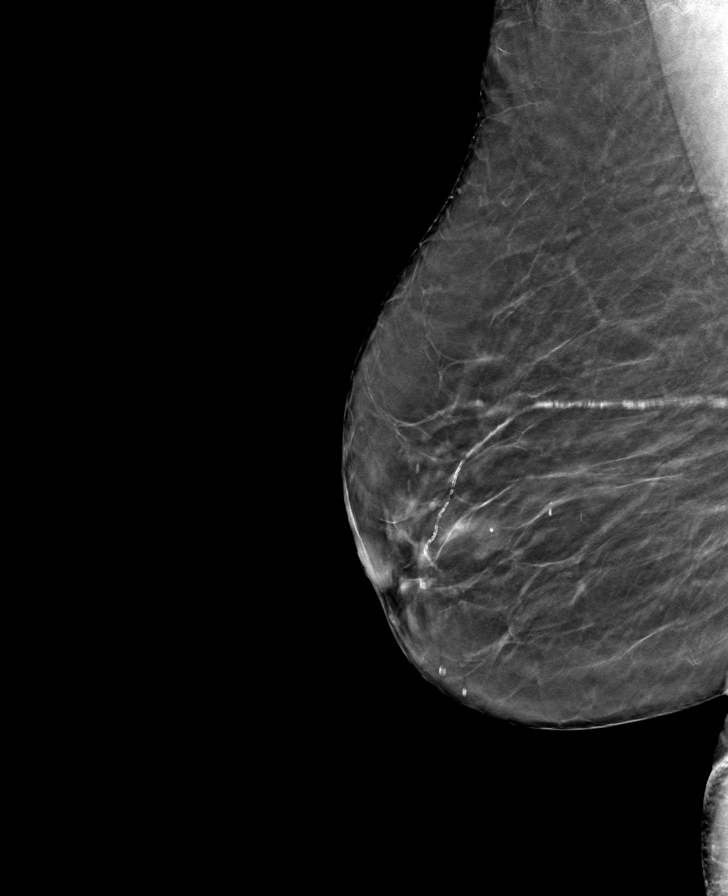

[R CC tomo · tomo slice 29/56.0]
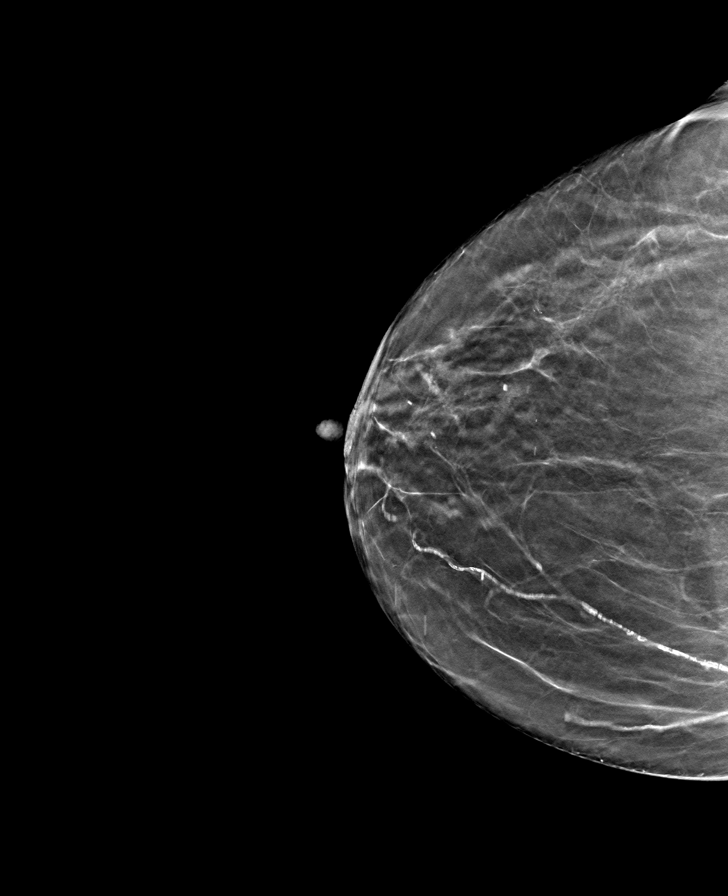

[8 of 24 positions shown; findings below may reference images not displayed]

ACR Breast Density Category b: There are scattered areas of
fibroglandular density.
FINDINGS: There are no findings suspicious for malignancy. Images were
processed with CAD.
IMPRESSION: No mammographic evidence of malignancy. A result letter of this
screening mammogram will be mailed directly to the patient.

RECOMMENDATION:
Screening mammogram in one year. (Code:[id number])

BI-RADS CATEGORY  1: Negative.

## 2019-12-19 NOTE — Telephone Encounter (Signed)
error 

## 2020-01-03 ENCOUNTER — Other Ambulatory Visit (HOSPITAL_COMMUNITY): Payer: Self-pay | Admitting: *Deleted

## 2020-01-03 MED ORDER — NITROGLYCERIN 0.4 MG SL SUBL: 0.4000 mg | SUBLINGUAL_TABLET | SUBLINGUAL | 3 refills | Status: DC | PRN

## 2020-01-09 ENCOUNTER — Ambulatory Visit (HOSPITAL_COMMUNITY)
Admission: RE | Admit: 2020-01-09 | Discharge: 2020-01-09 | Disposition: A | Discharge status: Home / Self Care | Payer: Medicare Other | Source: Ambulatory Visit | Attending: Internal Medicine | Admitting: Internal Medicine

## 2020-01-09 ENCOUNTER — Encounter (HOSPITAL_COMMUNITY): Payer: Self-pay | Admitting: Internal Medicine

## 2020-01-09 ENCOUNTER — Other Ambulatory Visit: Payer: Self-pay

## 2020-01-09 VITALS — BP 131/78 | HR 89 | Wt 167.6 lb

## 2020-01-09 DIAGNOSIS — G4733 Obstructive sleep apnea (adult) (pediatric): Secondary | ICD-10-CM | POA: Diagnosis not present

## 2020-01-09 DIAGNOSIS — I5042 Chronic combined systolic (congestive) and diastolic (congestive) heart failure: Secondary | ICD-10-CM | POA: Insufficient documentation

## 2020-01-09 DIAGNOSIS — J441 Chronic obstructive pulmonary disease with (acute) exacerbation: Secondary | ICD-10-CM | POA: Insufficient documentation

## 2020-01-09 DIAGNOSIS — Z955 Presence of coronary angioplasty implant and graft: Secondary | ICD-10-CM | POA: Insufficient documentation

## 2020-01-09 DIAGNOSIS — Z87891 Personal history of nicotine dependence: Secondary | ICD-10-CM | POA: Insufficient documentation

## 2020-01-09 DIAGNOSIS — I4892 Unspecified atrial flutter: Secondary | ICD-10-CM | POA: Diagnosis not present

## 2020-01-09 DIAGNOSIS — E119 Type 2 diabetes mellitus without complications: Secondary | ICD-10-CM | POA: Diagnosis not present

## 2020-01-09 DIAGNOSIS — I10 Essential (primary) hypertension: Secondary | ICD-10-CM | POA: Diagnosis not present

## 2020-01-09 DIAGNOSIS — I110 Hypertensive heart disease with heart failure: Secondary | ICD-10-CM | POA: Diagnosis not present

## 2020-01-09 DIAGNOSIS — M810 Age-related osteoporosis without current pathological fracture: Secondary | ICD-10-CM | POA: Insufficient documentation

## 2020-01-09 DIAGNOSIS — I252 Old myocardial infarction: Secondary | ICD-10-CM | POA: Diagnosis not present

## 2020-01-09 DIAGNOSIS — Z79899 Other long term (current) drug therapy: Secondary | ICD-10-CM | POA: Diagnosis not present

## 2020-01-09 DIAGNOSIS — I272 Pulmonary hypertension, unspecified: Secondary | ICD-10-CM | POA: Diagnosis not present

## 2020-01-09 DIAGNOSIS — Z794 Long term (current) use of insulin: Secondary | ICD-10-CM | POA: Insufficient documentation

## 2020-01-09 DIAGNOSIS — R011 Cardiac murmur, unspecified: Secondary | ICD-10-CM | POA: Insufficient documentation

## 2020-01-09 DIAGNOSIS — Z7901 Long term (current) use of anticoagulants: Secondary | ICD-10-CM | POA: Insufficient documentation

## 2020-01-09 DIAGNOSIS — E785 Hyperlipidemia, unspecified: Secondary | ICD-10-CM | POA: Diagnosis not present

## 2020-01-09 DIAGNOSIS — I4811 Longstanding persistent atrial fibrillation: Secondary | ICD-10-CM | POA: Diagnosis not present

## 2020-01-09 DIAGNOSIS — I2510 Atherosclerotic heart disease of native coronary artery without angina pectoris: Secondary | ICD-10-CM | POA: Diagnosis not present

## 2020-01-09 DIAGNOSIS — Z8249 Family history of ischemic heart disease and other diseases of the circulatory system: Secondary | ICD-10-CM | POA: Diagnosis not present

## 2020-01-09 DIAGNOSIS — I480 Paroxysmal atrial fibrillation: Secondary | ICD-10-CM | POA: Diagnosis not present

## 2020-01-09 DIAGNOSIS — E782 Mixed hyperlipidemia: Secondary | ICD-10-CM

## 2020-01-09 DIAGNOSIS — D509 Iron deficiency anemia, unspecified: Secondary | ICD-10-CM | POA: Insufficient documentation

## 2020-01-09 DIAGNOSIS — R6889 Other general symptoms and signs: Secondary | ICD-10-CM | POA: Diagnosis not present

## 2020-01-09 LAB — CBC
HCT: 51.8 % — ABNORMAL HIGH (ref 36.0–46.0)
Hemoglobin: 16.4 g/dL — ABNORMAL HIGH (ref 12.0–15.0)
MCH: 30.4 pg (ref 26.0–34.0)
MCHC: 31.7 g/dL (ref 30.0–36.0)
MCV: 96.1 fL (ref 80.0–100.0)
Platelets: 175 10*3/uL (ref 150–400)
RBC: 5.39 MIL/uL — ABNORMAL HIGH (ref 3.87–5.11)
RDW: 13.6 % (ref 11.5–15.5)
WBC: 5.3 10*3/uL (ref 4.0–10.5)
nRBC: 0 % (ref 0.0–0.2)

## 2020-01-09 LAB — LIPID PANEL
Cholesterol: 213 mg/dL — ABNORMAL HIGH (ref 0–200)
HDL: 56 mg/dL (ref >40)
LDL Cholesterol: 140 mg/dL — ABNORMAL HIGH (ref 0–99)
Total CHOL/HDL Ratio: 3.8 RATIO
Triglycerides: 86 mg/dL (ref <150)
VLDL: 17 mg/dL (ref 0–40)

## 2020-01-09 LAB — COMPREHENSIVE METABOLIC PANEL
ALT: 23 U/L (ref 0–44)
AST: 32 U/L (ref 15–41)
Albumin: 3.9 g/dL (ref 3.5–5.0)
Alkaline Phosphatase: 77 U/L (ref 38–126)
Anion gap: 14 (ref 5–15)
BUN: 25 mg/dL — ABNORMAL HIGH (ref 8–23)
CO2: 25 mmol/L (ref 22–32)
Calcium: 10 mg/dL (ref 8.9–10.3)
Chloride: 99 mmol/L (ref 98–111)
Creatinine, Ser: 1.08 mg/dL — ABNORMAL HIGH (ref 0.44–1.00)
GFR calc Af Amer: 54 mL/min — ABNORMAL LOW (ref >60)
GFR calc non Af Amer: 46 mL/min — ABNORMAL LOW (ref >60)
Glucose, Bld: 215 mg/dL — ABNORMAL HIGH (ref 70–99)
Potassium: 4.1 mmol/L (ref 3.5–5.1)
Sodium: 138 mmol/L (ref 135–145)
Total Bilirubin: 1 mg/dL (ref 0.3–1.2)
Total Protein: 8.1 g/dL (ref 6.5–8.1)

## 2020-01-09 NOTE — Addendum Note (Signed)
Encounter addended by: Valeda Malm, RN on: 01/09/2020 12:16 PM  Actions taken: Order list changed, Diagnosis association updated, Charge Capture section accepted, Clinical Note Signed

## 2020-01-09 NOTE — Patient Instructions (Addendum)
NO medication changes today!  Labs today We will only contact you if something comes back abnormal or we need to make some changes. Otherwise no news is good news!  Your physician recommends that you schedule a follow-up appointment in: 6 months. We will contact you in a few months to schedule your next appointment.    Please call office at [phone] option 2 if you have any questions or concerns.    At the [facility], you and your health needs are our priority. As part of our continuing mission to provide you with exceptional heart care, we have created designated Provider Care Teams. These Care Teams include your primary Cardiologist (physician) and Advanced Practice Providers (APPs- Physician Assistants and Nurse Practitioners) who all work together to provide you with the care you need, when you need it.   You may see any of the following providers on your designated Care Team at your next follow up: Marland Kitchen Dr Glori Bickers . Dr Loralie Champagne . Darrick Grinder, NP . Lyda Jester, PA . Audry Riles, PharmD   Please be sure to bring in all your medications bottles to every appointment.

## 2020-01-09 NOTE — Addendum Note (Signed)
Encounter addended by: Jolaine Artist, MD on: 01/09/2020 12:35 PM  Actions taken: Clinical Note Signed

## 2020-01-09 NOTE — Progress Notes (Addendum)
Advanced Heart Failure Clinic Note   Date:  01/09/2020   ID:  [name], DOB 07-Nov-1931, MRN [id number]  Location: Home  Provider location: [facility] Type of Visit: Established patient  PCP:  [name], MD  Cardiologist:  No primary care provider on file. Primary HF: Cheron Coryell  Chief Complaint: Heart Failure follow-up   History of Present Illness:  [name] is a 86 y.o. female with CAD s/p 1v stent, HTN, atrial fibrillation/A flutter and chronic prior systolic heart failure, (which was likely rate related) and diastolic heart failure. She was referred by Dr. Lovena Le for further evaluation of Pulmonary HTN.   Underwent 2D echo in 3/15 which showed EF 60-65% mild LVH. RV was normal. No significant valvular abnormalities.   Underwent R/L cath 05/07/14 Which showed stable CAD and significant PH with normal PVR.   Admitted 05/21/17-05/27/17 with COPD exacerbation, acute on chronic diastolic CHF. Diuresed 5L with IV Lasix. Discharge weight 173 pounds.  Myoview 9/20   Nuclear stress EF: 56%.  There was no ST segment deviation noted during stress.  Defect 1: There is a medium defect of moderate severity present in the mid inferolateral, apical inferior and apical lateral location.  Findings consistent with possible mild ischemia in the apical inferior and inferolateral regions.  The left ventricular ejection fraction is normal (55-65%).     She presents today for routine f/u. Says she is doing very well. Was unable to tolerate Jardiance 10mg  so cut in half and now taking 5mg  and tolerating well. Breathing "isn't great" but can do ADLs without problem. No longer driving. In the Spring moving to [employer]. No edema. Intermittent chest fullness    Studies:  RA 6 RV 71/7 6 PA 63/17 (33)  PCWP 19 mm Hg  LV 174/16 mm Hg  AO 171/62 mean 103 mm Hg  PVR = 2.86 Oxygen saturations:  PA 69%  AO 99%    Cardiac Output/Index (Fick) 4.88/2.7    Left mainstem: Normal.  Left anterior descending (LAD): Moderate calcification proximally. Mild irregularities less than 10%. The first diagonal has 40-50% ostial disease.  Left circumflex (LCx): The LCx gives rise to a large OM1 then terminates in the AV groove. There is 20% disease in the proximal LCx. The first OM stent is patent with diffuse 20% disease.  Right coronary artery (RCA): The RCA arises anteriorly. There is an eccentric slit like stenosis in the proximal vessel to 60-70%. The mid vessel has segmental 70-80% disease.  Left ventriculography: Left ventricular systolic function is normal, LVEF is estimated at 55-65%, there is no significant mitral regurgitation     Past Medical History:  Diagnosis Date  . Anemia    iron defficiency  . Anginal pain ([place])   . Arthritis    "knees, feet, hands; joints" (05/27/2015)  . Asthma   . Atrial fibrillation or flutter    s/p RFCA 7/08;   s/p DCCV in past;   previously on amiodarone;  amio stopped due to lung toxicity  . CAD (coronary artery disease)    s/p NSTEMI tx with BMS to OM1 3/08;  cath 3/08: pOM 99% tx with PCI, pLAD 20%, ? mod stenosis at the AM  . CAP (community acquired pneumonia) 05/24/2015  . Chronic diastolic heart failure (HCC)    echo 11/11:  EF 55-60%, severe LVH, mod LAE, mild MR, mildly increased PASP  . COPD (chronic obstructive pulmonary disease) ([place])   .  Degenerative joint disease   . Dystrophy, corneal stromal   . Heart murmur   . HLD (hyperlipidemia)   . HTN (hypertension)    essential nos  . Hypopotassemia    PMH of  . Muscle pain   . Myocardial infarction ([place]) 2008  . OSA on CPAP   . Osteoporosis   . Pneumonia 05/27/2015  . Protein calorie malnutrition ([place])   . Rash and nonspecific skin eruption    both arms,awaiting bio   dr Delman Cheadle  . Seasonal allergies   . Shortness of breath dyspnea   . Type II diabetes mellitus ([place])    Dr Loanne Drilling   Past Surgical  History:  Procedure Laterality Date  . A FLUTTER ABLATION     Dr Lovena Le  . CATARACT EXTRACTION W/ INTRAOCULAR LENS  IMPLANT, BILATERAL Bilateral   . CHOLECYSTECTOMY    . COLONOSCOPY  6/07   2 polyps Dr. Kinnie Feil in [place]  . colonoscopy with polypectomy      X 2; [facility]  . CORNEAL TRANSPLANT Bilateral   . CORONARY ANGIOPLASTY WITH STENT PLACEMENT  02/2007   BMS w/Dr Lovena Le  . EYE SURGERY    . gravid 2 para 2    . KNEE CARTILAGE SURGERY Right   . LEFT AND RIGHT HEART CATHETERIZATION WITH CORONARY ANGIOGRAM N/A 05/07/2014   Procedure: LEFT AND RIGHT HEART CATHETERIZATION WITH CORONARY ANGIOGRAM;  Surgeon: Peter M Martinique, MD;  Location: [facility] CATH LAB;  Service: Cardiovascular;  Laterality: N/A;  . TOTAL KNEE ARTHROPLASTY Right 06/28/2016   Procedure: TOTAL KNEE ARTHROPLASTY;  Surgeon: Frederik Pear, MD;  Location: [facility];  Service: Orthopedics;  Laterality: Right;     Current Outpatient Medications  Medication Sig Dispense Refill  . acetaminophen (TYLENOL) 500 MG tablet Take 1,000 mg by mouth every 6 (six) hours as needed (pain).    . CALCIUM-MAG-VIT C-VIT D PO Take 1 tablet by mouth daily.    . carvedilol (COREG) 6.25 MG tablet TAKE 1 TABLET BY MOUTH TWO  TIMES DAILY WITH MEALS 180 tablet 3  . cholecalciferol (VITAMIN D) 1000 units tablet Take 1,000 Units by mouth daily after supper.    . diclofenac sodium (VOLTAREN) 1 % GEL Apply 4 g topically 4 (four) times daily. 100 g 5  . ELIQUIS 5 MG TABS tablet TAKE 1 TABLET BY MOUTH  TWICE DAILY 180 tablet 3  . empagliflozin (JARDIANCE) 10 MG TABS tablet Take 10 mg by mouth daily before breakfast. 90 tablet 3  . Evolocumab (REPATHA SURECLICK) [id number] MG/ML SOAJ Inject 1 Dose into the skin every 14 (fourteen) days. 2 pen 11  . furosemide (LASIX) 80 MG tablet TAKE 1 TABLET BY MOUTH TWO  TIMES DAILY 180 tablet 3  . glucose blood (ONETOUCH VERIO) test strip Use to monitor glucose levels 2 times daily 200 each 5  . insulin NPH-regular Human (NOVOLIN 70/30  RELION) (70-30) 100 UNIT/ML injection Inject 20 Units into the skin daily with breakfast. And syringes 1/day 10 mL 11  . Lancets (ONETOUCH ULTRASOFT) lancets Use to monitor glucose levels 2 times daily 200 each 2  . Multiple Vitamin (MULTIVITAMIN WITH MINERALS) TABS tablet Take 1 tablet by mouth daily. Centrum Silver    . nitroGLYCERIN (NITROSTAT) 0.4 MG SL tablet Place 1 tablet (0.4 mg total) under the tongue every 5 (five) minutes as needed for chest pain. Reported on 01/28/2016 30 tablet 3  . potassium chloride SA (K-DUR) 20 MEQ tablet TAKE 2 TABLETS BY MOUTH  DAILY 180 tablet 3  .  tiotropium (SPIRIVA HANDIHALER) 18 MCG inhalation capsule Place 1 capsule (18 mcg total) into inhaler and inhale daily. 90 capsule 1   No current facility-administered medications for this encounter.    Allergies:   Amlodipine besylate, Levaquin [levofloxacin], Lobster [shellfish allergy], Penicillins, Valsartan, Codeine, Lipitor [atorvastatin], Oxycodone, Statins, Tramadol, Crestor [rosuvastatin calcium], and Tramadol hcl   Social History:  The patient  reports that she quit smoking about 51 years ago. Her smoking use included cigarettes. She has a 4.50 pack-year smoking history. She has never used smokeless tobacco. She reports current alcohol use. She reports that she does not use drugs.   Family History:  The patient's family history includes Arthritis in her father, maternal aunt, and mother; Breast cancer in her maternal aunt; Diabetes in her mother; Heart attack (age of onset: 79) in her father; Hypertension in her father and mother; Transient ischemic attack in her mother.   ROS:  Please see the history of present illness.   All other systems are personally reviewed and negative.   Vitals:   01/09/20 1123  BP: 131/78  Pulse: 89  SpO2: 94%  Weight: 76 kg (167 lb 9.6 oz)    Exam:   General:  Well appearing. Looks younger than stated age. No resp difficulty HEENT: normal Neck: supple. no JVD. Carotids 2+  bilat; no bruits. No lymphadenopathy or thryomegaly appreciated. Cor: PMI nondisplaced. Regular rate & rhythm. No rubs, gallops or murmurs. Lungs: clear Abdomen: soft, nontender, nondistended. No hepatosplenomegaly. No bruits or masses. Good bowel sounds. Extremities: no cyanosis, clubbing, rash, edema Neuro: alert & orientedx3, cranial nerves grossly intact. moves all 4 extremities w/o difficulty. Affect pleasant  Recent Labs: 01/31/2019: ALT 16; BUN 26; Creatinine, Ser 1.18; Hemoglobin 15.7; Platelets 187.0; Potassium 4.3; Sodium 137 08/22/2019: TSH 1.50  Personally reviewed   Wt Readings from Last 3 Encounters:  01/09/20 76 kg (167 lb 9.6 oz)  11/14/19 76 kg (167 lb 9.6 oz)  10/08/19 74.5 kg (164 lb 3.2 oz)    NSR 80 with marked 1 AVB (360ms) LVH with repol. Personally reviewed   ASSESSMENT AND PLAN:  1) Chronic diastolic CHF:  - Echo [id number] EF 60-65%, LA severely dilated, Echo 10/2017: EF 65-70%, grade 3 DD - Stable NYHA II-III - Volume status ok. Weight stable.  - Continue lasix 80 mg BID. Can take extra as needed - Continue KCL 40 bid  2) Paroxysmal atrial fib/flutter - This patients CHA2DS2-VASc Score and unadjusted Ischemic Stroke Rate (% per year) is equal to 7.2 % stroke rate/year from a score of 5 Above score calculated as 2 points each if present [Age > 75, or Stroke/TIA/TE] - In NSR today with marked 1AVB. B-blocker dosing previously reduced. Wil follow. Need need for PPM currently.   - Continue Eliquis. No bleeding.  3) OSA/COPD - Follows with Dr. Halford Chessman.  - Not wearing CPAP. Not very interested in pursuing a new mask.   4) CAD - H/o of stent in 2008 Last cath 2015 with moderate CAD.  - Still with intermittent chest fullness - Myoview 9/20 mild ischemia. Continue medical therapy unless symptoms worse.  - No ASA with Apixaban - Unable to tolerate statins. On [place] with [facility]  5) HTN - BP is a bit high. Hopefully will improve with  Jardiance  6) DM2 - Continue Jardiance 5mg  daily (unable to tolerate 10mg  daily)  7) HL  - Unable to tolerate statins. On Repatha - LDL 207 in 8/20 - Recheck lipids today - Follows  with [facility]   Signed, Glori Bickers, MD  01/09/2020 12:00 PM  Advanced Heart Failure [address] [place] [address] [phone] (office) [phone] (fax)

## 2020-01-18 ENCOUNTER — Telehealth: Payer: Self-pay

## 2020-01-18 NOTE — Telephone Encounter (Signed)
FYI

## 2020-01-18 NOTE — Telephone Encounter (Signed)
New message    How much weight have you gained and in what time span? 4-5 lbs  If swelling, where is the swelling located? Right foot   Are you currently taking a fluid pill? Yes   Appt made in office on Monday 01/21/20 @ 11:00am

## 2020-01-20 NOTE — Progress Notes (Deleted)
Subjective:    Patient ID: [name], female    DOB: 1931-12-08, 86 y.o.   MRN: [id number]  HPI The patient is here for an acute visit for swollen right foot.     Medications and allergies reviewed with patient and updated if appropriate.  Patient Active Problem List   Diagnosis Date Noted  . Difficulty sleeping 08/01/2019  . Osteoarthritis 08/01/2019  . Abdominal cramping 01/31/2019  . Arthritis of left sacroiliac joint 05/04/2017  . Osteopenia 02/17/2017  . Piriformis syndrome of left side 02/16/2017  . Acquired leg length discrepancy 02/16/2017  . COPD GOLD I  01/27/2017  . Lumbar radiculopathy, acute 01/27/2017  . Granuloma annulare 07/27/2016  . Numbness of fingers 07/27/2016  . Primary osteoarthritis of right knee 06/27/2016  . Diabetes ([place]) 03/07/2016  . Multifocal pneumonia 05/24/2015  . Sepsis ([place]) 05/24/2015  . OSA on CPAP 08/28/2014  . Chronic diastolic heart failure ([place]) 07/30/2014  . Nocturnal hypoxemia 07/09/2014  . SOB (shortness of breath) 05/15/2014  . Pulmonary HTN ([place]) 05/15/2014  . Crescendo angina ([place]) 05/02/2014  . Band keratopathy 01/16/2014  . 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 Hyperthyroidism 11/04/2011  . Multinodular goiter 10/14/2011  . Atrophy, Fuchs' 09/29/2011  . ATRIAL FIBRILLATION 06/03/2010  . CHF 02/17/2010  . VITILIGO 11/07/2009  . SINOATRIAL NODE DYSFUNCTION 06/16/2009  . Iron deficiency anemia 01/02/2009  . ANEMIA, PERNICIOUS 11/20/2008  . Atrial flutter ([place]) 06/25/2008  . HYPERLIPIDEMIA 01/29/2008  . Coronary atherosclerosis 01/29/2008  . HTN (hypertension) 10/30/2007    Current Outpatient Medications on File Prior to Visit  Medication Sig Dispense Refill  . acetaminophen (TYLENOL) 500 MG tablet Take 1,000 mg by mouth every 6 (six) hours as needed (pain).    . CALCIUM-MAG-VIT C-VIT D PO Take 1 tablet by mouth daily.    . carvedilol (COREG) 6.25 MG tablet TAKE 1 TABLET BY MOUTH TWO  TIMES DAILY  WITH MEALS 180 tablet 3  . cholecalciferol (VITAMIN D) 1000 units tablet Take 1,000 Units by mouth daily after supper.    . diclofenac sodium (VOLTAREN) 1 % GEL Apply 4 g topically 4 (four) times daily. 100 g 5  . ELIQUIS 5 MG TABS tablet TAKE 1 TABLET BY MOUTH  TWICE DAILY 180 tablet 3  . empagliflozin (JARDIANCE) 10 MG TABS tablet Take 10 mg by mouth daily before breakfast. 90 tablet 3  . Evolocumab (REPATHA SURECLICK) XX123456 MG/ML SOAJ Inject 1 Dose into the skin every 14 (fourteen) days. 2 pen 11  . furosemide (LASIX) 80 MG tablet TAKE 1 TABLET BY MOUTH TWO  TIMES DAILY 180 tablet 3  . glucose blood (ONETOUCH VERIO) test strip Use to monitor glucose levels 2 times daily 200 each 5  . insulin NPH-regular Human (NOVOLIN 70/30 RELION) (70-30) 100 UNIT/ML injection Inject 20 Units into the skin daily with breakfast. And syringes 1/day 10 mL 11  . Lancets (ONETOUCH ULTRASOFT) lancets Use to monitor glucose levels 2 times daily 200 each 2  . Multiple Vitamin (MULTIVITAMIN WITH MINERALS) TABS tablet Take 1 tablet by mouth daily. Centrum Silver    . nitroGLYCERIN (NITROSTAT) 0.4 MG SL tablet Place 1 tablet (0.4 mg total) under the tongue every 5 (five) minutes as needed for chest pain. Reported on 01/28/2016 30 tablet 3  . potassium chloride SA (K-DUR) 20 MEQ tablet TAKE 2 TABLETS BY MOUTH  DAILY 180 tablet 3  . tiotropium (SPIRIVA HANDIHALER) 18 MCG inhalation capsule Place 1 capsule (18 mcg total) into inhaler  and inhale daily. 90 capsule 1   No current facility-administered medications on file prior to visit.    Past Medical History:  Diagnosis Date  . Anemia    iron defficiency  . Anginal pain ([place])   . Arthritis    "knees, feet, hands; joints" (05/27/2015)  . Asthma   . Atrial fibrillation or flutter    s/p RFCA 7/08;   s/p DCCV in past;   previously on amiodarone;  amio stopped due to lung toxicity  . CAD (coronary artery disease)    s/p NSTEMI tx with BMS to OM1 3/08;  cath 3/08: pOM 99% tx  with PCI, pLAD 20%, ? mod stenosis at the AM  . CAP (community acquired pneumonia) 05/24/2015  . Chronic diastolic heart failure (HCC)    echo 11/11:  EF 55-60%, severe LVH, mod LAE, mild MR, mildly increased PASP  . COPD (chronic obstructive pulmonary disease) ([place])   . Degenerative joint disease   . Dystrophy, corneal stromal   . Heart murmur   . HLD (hyperlipidemia)   . HTN (hypertension)    essential nos  . Hypopotassemia    PMH of  . Muscle pain   . Myocardial infarction ([place]) 2008  . OSA on CPAP   . Osteoporosis   . Pneumonia 05/27/2015  . Protein calorie malnutrition ([place])   . Rash and nonspecific skin eruption    both arms,awaiting bio   dr Delman Cheadle  . Seasonal allergies   . Shortness of breath dyspnea   . Type II diabetes mellitus ([place])    Dr Loanne Drilling    Past Surgical History:  Procedure Laterality Date  . A FLUTTER ABLATION     Dr Lovena Le  . CATARACT EXTRACTION W/ INTRAOCULAR LENS  IMPLANT, BILATERAL Bilateral   . CHOLECYSTECTOMY    . COLONOSCOPY  6/07   2 polyps Dr. Kinnie Feil in [place]  . colonoscopy with polypectomy      X 2; [facility]  . CORNEAL TRANSPLANT Bilateral   . CORONARY ANGIOPLASTY WITH STENT PLACEMENT  02/2007   BMS w/Dr Lovena Le  . EYE SURGERY    . gravid 2 para 2    . KNEE CARTILAGE SURGERY Right   . LEFT AND RIGHT HEART CATHETERIZATION WITH CORONARY ANGIOGRAM N/A 05/07/2014   Procedure: LEFT AND RIGHT HEART CATHETERIZATION WITH CORONARY ANGIOGRAM;  Surgeon: Peter M Martinique, MD;  Location: [facility] CATH LAB;  Service: Cardiovascular;  Laterality: N/A;  . TOTAL KNEE ARTHROPLASTY Right 06/28/2016   Procedure: TOTAL KNEE ARTHROPLASTY;  Surgeon: Frederik Pear, MD;  Location: [facility];  Service: Orthopedics;  Laterality: Right;    Social History   Socioeconomic History  . Marital status: Widowed    Spouse name: Not on file  . Number of children: 2  . Years of education: Not on file  . Highest education level: Not on file  Occupational History  . Occupation: Recruitment consultant  Retired  Tobacco Use  . Smoking status: Former Smoker    Packs/day: 0.25    Years: 18.00    Pack years: 4.50    Types: Cigarettes    Quit date: 12/27/1968    Years since quitting: 51.0  . Smokeless tobacco: Never Used  . Tobacco comment: smoked [place], up to 1 pp week  Substance and Sexual Activity  . Alcohol use: Yes    Comment: occ  . Drug use: No  . Sexual activity: Never  Other Topics Concern  . Not on file  Social History Narrative   Teacher.  Widowed. Rarely drinks cafeine.    Social Determinants of Health   Financial Resource Strain:   . Difficulty of Paying Living Expenses: Not on file  Food Insecurity:   . Worried About Charity fundraiser in the Last Year: Not on file  . Ran Out of Food in the Last Year: Not on file  Transportation Needs:   . Lack of Transportation (Medical): Not on file  . Lack of Transportation (Non-Medical): Not on file  Physical Activity: Insufficiently Active  . Days of Exercise per Week: 4 days  . Minutes of Exercise per Session: 30 min  Stress: No Stress Concern Present  . Feeling of Stress : Only a little  Social Connections: Slightly Isolated  . Frequency of Communication with Friends and Family: More than three times a week  . Frequency of Social Gatherings with Friends and Family: More than three times a week  . Attends Religious Services: 1 to 4 times per year  . Active Member of Clubs or Organizations: Yes  . Attends Archivist Meetings: 1 to 4 times per year  . Marital Status: Widowed    Family History  Problem Relation Age of Onset  . Diabetes Mother   . Hypertension Mother   . Transient ischemic attack Mother   . Arthritis Mother   . Heart attack Father 67  . Arthritis Father   . Hypertension Father   . Breast cancer Maternal Aunt   . Arthritis Maternal Aunt     Review of Systems     Objective:  There were no vitals filed for this visit. BP Readings from Last 3 Encounters:  01/09/20 131/78  11/14/19  (!) 150/80  10/08/19 (!) 141/82   Wt Readings from Last 3 Encounters:  01/09/20 167 lb 9.6 oz (76 kg)  11/14/19 167 lb 9.6 oz (76 kg)  10/08/19 164 lb 3.2 oz (74.5 kg)   There is no height or weight on file to calculate BMI.   Physical Exam         Assessment & Plan:    See Problem List for Assessment and Plan of chronic medical problems.    This visit occurred during the SARS-CoV-2 public health emergency.  Safety protocols were in place, including screening questions prior to the visit, additional usage of staff PPE, and extensive cleaning of exam room while observing appropriate contact time as indicated for disinfecting solutions.

## 2020-01-21 ENCOUNTER — Ambulatory Visit: Payer: Medicare Other | Admitting: Internal Medicine

## 2020-01-22 ENCOUNTER — Telehealth: Payer: Self-pay

## 2020-01-22 ENCOUNTER — Telehealth (HOSPITAL_COMMUNITY): Payer: Self-pay | Admitting: *Deleted

## 2020-01-22 NOTE — Telephone Encounter (Signed)
Rep from [employer] is calling to have someone follow up on patient. Rep name is Tyanna. Rep states that the pt blood pressure was 168/90 , patient gained 4 pounds within a week and patient have shortness of breathe. Rep stated the patient best contact number [phone]

## 2020-01-22 NOTE — Telephone Encounter (Signed)
Pt aware of response and stated that she spoke with cardiology and they asked her to increase fluid pill. She has stated that she is going to do better with her eating habits.

## 2020-01-22 NOTE — Telephone Encounter (Signed)
Pt called c/o swelling in feet and weight up 4lbs over the weekend. Patient admits to eating take out food she helped her grand daughter move this weekend.  No other complaints at this time. Patient wants to know if she should take any extra diuretics.  Routed to Darrick Grinder, NP for advice

## 2020-01-22 NOTE — Telephone Encounter (Signed)
Take an extra 80 mg lasix for 2 days.  Call back if no improvement.   Alyana Kreiter NP-C  1:10 PM

## 2020-01-22 NOTE — Telephone Encounter (Signed)
Pt aware and agreeable with plan.  

## 2020-01-22 NOTE — Telephone Encounter (Signed)
Have a recheck to cardiology to see what they think about the extra pill.  She needs to stop the takeout food and make sure she is eating a low-sodium diet prevent her heart failure from from becoming an issue and possibly ending up in the emergency room.  She should be monitoring her blood pressure daily and let either cardiology or I know if her blood pressure is not well controlled.

## 2020-01-22 NOTE — Telephone Encounter (Signed)
Spoke with patient in regards. She states that she was woken up at 7:30 this morning by the nurse. She had not eaten or taken her BP meds yet. She states her SOB is no more than usual and she is not overly worried about it due to her having AFIB and CHF. She has taken her medication and will recheck her BP shortly. She states that she did not want to come in unless you thought it was absolutely necessary due to her age and the virus.   She did say that her feet have been a bit swollen which she usually does not have a problem with. She said that she has been helping her granddaughter move over the past week and eating a lot of take out with high sodium. So that could be the reason for weight gain and swelling. She wanted to know if you think she should reach out to cardio to see about increasing fluid pill due to the swelling?

## 2020-01-30 ENCOUNTER — Encounter: Payer: Self-pay | Admitting: Internal Medicine

## 2020-01-30 ENCOUNTER — Other Ambulatory Visit: Payer: Self-pay

## 2020-01-30 ENCOUNTER — Ambulatory Visit (INDEPENDENT_AMBULATORY_CARE_PROVIDER_SITE_OTHER): Payer: Medicare Other | Admitting: Internal Medicine

## 2020-01-30 VITALS — BP 156/67 | HR 81 | Temp 97.5°F | Ht 62.0 in | Wt 170.0 lb

## 2020-01-30 DIAGNOSIS — I5022 Chronic systolic (congestive) heart failure: Secondary | ICD-10-CM | POA: Diagnosis not present

## 2020-01-30 DIAGNOSIS — E782 Mixed hyperlipidemia: Secondary | ICD-10-CM

## 2020-01-30 DIAGNOSIS — R6889 Other general symptoms and signs: Secondary | ICD-10-CM | POA: Diagnosis not present

## 2020-01-30 DIAGNOSIS — I2510 Atherosclerotic heart disease of native coronary artery without angina pectoris: Secondary | ICD-10-CM | POA: Diagnosis not present

## 2020-01-30 MED ORDER — EZETIMIBE 10 MG PO TABS: 10.0000 mg | ORAL_TABLET | Freq: Every day | ORAL | 3 refills | Status: DC

## 2020-01-30 NOTE — Patient Instructions (Signed)
Medication Instructions:  START zetia 10mg  daily  *If you need a refill on your cardiac medications before your next appointment, please call your pharmacy*  Lab Work: FASTING lab work in 3-4 months to check cholesterol   If you have labs (blood work) drawn today and your tests are completely normal, you will receive your results only by: Marland Kitchen MyChart Message (if you have MyChart) OR . A paper copy in the mail If you have any lab test that is abnormal or we need to change your treatment, we will call you to review the results.  Testing/Procedures: NONE  Follow-Up: At [facility], you and your health needs are our priority.  As part of our continuing mission to provide you with exceptional heart care, we have created designated Provider Care Teams.  These Care Teams include your primary Cardiologist (physician) and Advanced Practice Providers (APPs -  Physician Assistants and Nurse Practitioners) who all work together to provide you with the care you need, when you need it.  Your next appointment:   3-4 month(s) - lipid clinic  The format for your next appointment:   Either In Person or Virtual  Provider:   K. Mali Hilty, MD  Other Instructions

## 2020-01-30 NOTE — Progress Notes (Signed)
LIPID CLINIC CONSULT NOTE  Chief Complaint:  Follow-up dyslipidemia  Primary Care Physician: [name] Rail, [name]  Primary Cardiologist:  No primary care provider on file.  HPI:  [name] is a 86 y.o. female who is being seen today for the evaluation of dyslipidemia at the request of [name], [name], [name].  This is a pleasant 86 year old female with a history of CAD and a single-vessel stent, hypertension, A. fib/flutter and history of systolic and diastolic heart failure.  She was recently seen by [name] for pulmonary hypertension.  She was noted to have a high LDL cholesterol and has been unable to tolerate statins.  She was also started on Jardiance and referred to the lipid clinic for evaluation of PCSK9 inhibitor therapy.  Her most recent labs as of August 2020 showed total cholesterol 290, triglycerides 135, HDL 56 and LDL of 207.  Most of her LDLs have ranged in the 200s, suggesting significant possibility of a familial hyperlipidemia.  In fact there is a family history of heart disease in multiple family members, however her presentation is atypical without aggressive early onset coronary disease is typically 1 would expect.  She does report somewhat of Southern diet with increased cholesterol intake including eating daily eggs, cheese, butter and fish including shellfish and shrimp.  01/30/2020  [name] returns today for follow-up.  She has stopped statin therapy and transition to [place].  She reports no difficulty with the injections.  She denies any myalgias.  Her cholesterol has improved significantly.  Total is now 213 with an LDL of 140, this is down from 207 previously.  Also it is noted that her glycemic control is not optimal and she has had some weight gain and decrease in activity therefore her cholesterol may actually improve if we can improve on some of these parameters.  That being said, she remains still well above target LDL less than 70.  It may not be  possible to achieve that without the ability to use a statin.  PMHx:  Past Medical History:  Diagnosis Date  . Anemia    iron defficiency  . Anginal pain ([place])   . Arthritis    "knees, feet, hands; joints" (05/27/2015)  . Asthma   . Atrial fibrillation or flutter    s/p RFCA 7/08;   s/p DCCV in past;   previously on amiodarone;  amio stopped due to lung toxicity  . CAD (coronary artery disease)    s/p NSTEMI tx with BMS to OM1 3/08;  cath 3/08: pOM 99% tx with PCI, pLAD 20%, ? mod stenosis at the AM  . CAP (community acquired pneumonia) 05/24/2015  . Chronic diastolic heart failure (HCC)    echo 11/11:  EF 55-60%, severe LVH, mod LAE, mild MR, mildly increased PASP  . COPD (chronic obstructive pulmonary disease) ([place])   . Degenerative joint disease   . Dystrophy, corneal stromal   . Heart murmur   . HLD (hyperlipidemia)   . HTN (hypertension)    essential nos  . Hypopotassemia    PMH of  . Muscle pain   . Myocardial infarction ([place]) 2008  . OSA on CPAP   . Osteoporosis   . Pneumonia 05/27/2015  . Protein calorie malnutrition ([place])   . Rash and nonspecific skin eruption    both arms,awaiting bio   [name] Delman Cheadle  . Seasonal allergies   . Shortness of breath dyspnea   . Type II diabetes mellitus ([place])  [name]    Past Surgical History:  Procedure Laterality Date  . A FLUTTER ABLATION     [name] Lovena Le  . CATARACT EXTRACTION W/ INTRAOCULAR LENS  IMPLANT, BILATERAL Bilateral   . CHOLECYSTECTOMY    . COLONOSCOPY  6/07   2 polyps [name]. Kinnie Feil in [place]  . colonoscopy with polypectomy      X 2; [facility]  . CORNEAL TRANSPLANT Bilateral   . CORONARY ANGIOPLASTY WITH STENT PLACEMENT  02/2007   BMS w/[name] Lovena Le  . EYE SURGERY    . gravid 2 para 2    . KNEE CARTILAGE SURGERY Right   . LEFT AND RIGHT HEART CATHETERIZATION WITH CORONARY ANGIOGRAM N/A 05/07/2014   Procedure: LEFT AND RIGHT HEART CATHETERIZATION WITH CORONARY ANGIOGRAM;  Surgeon: Peter M Martinique, [name];  Location: [facility] CATH LAB;   Service: Cardiovascular;  Laterality: N/A;  . TOTAL KNEE ARTHROPLASTY Right 06/28/2016   Procedure: TOTAL KNEE ARTHROPLASTY;  Surgeon: Frederik Pear, [name];  Location: [place];  Service: Orthopedics;  Laterality: Right;    FAMHx:  Family History  Problem Relation Age of Onset  . Diabetes Mother   . Hypertension Mother   . Transient ischemic attack Mother   . Arthritis Mother   . Heart attack Father 56  . Arthritis Father   . Hypertension Father   . Breast cancer Maternal Aunt   . Arthritis Maternal Aunt     SOCHx:   reports that she quit smoking about 51 years ago. Her smoking use included cigarettes. She has a 4.50 pack-year smoking history. She has never used smokeless tobacco. She reports current alcohol use. She reports that she does not use drugs.  ALLERGIES:  Allergies  Allergen Reactions  . Amlodipine Besylate Other (See Comments)    REACTION: tingling in lips & gum edema  . Levaquin [Levofloxacin] Shortness Of Breath and Swelling    angioedema  . Lobster [Shellfish Allergy] Other (See Comments)    angioedema  . Penicillins Rash    Has patient had a PCN reaction causing immediate rash, facial/tongue/throat swelling, SOB or lightheadedness with hypotension: Yes Has patient had a PCN reaction causing severe rash involving mucus membranes or skin necrosis: No Has patient had a PCN reaction that required hospitalization: No Has patient had a PCN reaction occurring within the last 10 years: No If all of the above answers are "NO", then may proceed with Cephalosporin use.  . Valsartan Other (See Comments)    REACTION: angioedema  . Codeine Other (See Comments)    Mental status changes  . Lipitor [Atorvastatin] Other (See Comments)    weakness  . Oxycodone Other (See Comments)    hallucinations  . Statins     Made her too weak  . Tramadol   . Crestor [Rosuvastatin Calcium] Rash  . Tramadol Hcl Nausea And Vomiting    ROS: Pertinent items noted in HPI and remainder of  comprehensive ROS otherwise negative.  HOME MEDS: Current Outpatient Medications on File Prior to Visit  Medication Sig Dispense Refill  . acetaminophen (TYLENOL) 500 MG tablet Take 1,000 mg by mouth every 6 (six) hours as needed (pain).    . CALCIUM-MAG-VIT C-VIT D PO Take 1 tablet by mouth daily.    . carvedilol (COREG) 6.25 MG tablet TAKE 1 TABLET BY MOUTH TWO  TIMES DAILY WITH MEALS 180 tablet 3  . cetirizine (ZYRTEC) 10 MG tablet Take 10 mg by mouth daily. 1 daily    . cholecalciferol (VITAMIN D) 1000 units tablet Take  1,000 Units by mouth daily after supper.    . diclofenac sodium (VOLTAREN) 1 % GEL Apply 4 g topically 4 (four) times daily. 100 g 5  . ELIQUIS 5 MG TABS tablet TAKE 1 TABLET BY MOUTH  TWICE DAILY 180 tablet 3  . empagliflozin (JARDIANCE) 10 MG TABS tablet Take 10 mg by mouth daily before breakfast. 90 tablet 3  . Evolocumab (REPATHA SURECLICK) [id number] MG/ML SOAJ Inject 1 Dose into the skin every 14 (fourteen) days. 2 pen 11  . furosemide (LASIX) 80 MG tablet TAKE 1 TABLET BY MOUTH TWO  TIMES DAILY 180 tablet 3  . glucose blood (ONETOUCH VERIO) test strip Use to monitor glucose levels 2 times daily 200 each 5  . insulin NPH-regular Human (NOVOLIN 70/30 RELION) (70-30) 100 UNIT/ML injection Inject 20 Units into the skin daily with breakfast. And syringes 1/day 10 mL 11  . Lancets (ONETOUCH ULTRASOFT) lancets Use to monitor glucose levels 2 times daily 200 each 2  . Multiple Vitamin (MULTIVITAMIN WITH MINERALS) TABS tablet Take 1 tablet by mouth daily. Centrum Silver    . nitroGLYCERIN (NITROSTAT) 0.4 MG SL tablet Place 1 tablet (0.4 mg total) under the tongue every 5 (five) minutes as needed for chest pain. Reported on 01/28/2016 30 tablet 3  . potassium chloride SA (K-DUR) 20 MEQ tablet TAKE 2 TABLETS BY MOUTH  DAILY 180 tablet 3  . tiotropium (SPIRIVA HANDIHALER) 18 MCG inhalation capsule Place 1 capsule (18 mcg total) into inhaler and inhale daily. 90 capsule 1   No current  facility-administered medications on file prior to visit.    LABS/IMAGING: No results found for this or any previous visit (from the past 48 hour(s)). No results found.  LIPID PANEL:    Component Value Date/Time   CHOL 213 (H) 01/09/2020 1215   TRIG 86 01/09/2020 1215   TRIG 52 11/10/2006 0845   HDL 56 01/09/2020 1215   CHOLHDL 3.8 01/09/2020 1215   VLDL 17 01/09/2020 1215   LDLCALC 140 (H) 01/09/2020 1215   LDLDIRECT 144.6 02/03/2010 0838    WEIGHTS: Wt Readings from Last 3 Encounters:  01/30/20 170 lb (77.1 kg)  01/09/20 167 lb 9.6 oz (76 kg)  11/14/19 167 lb 9.6 oz (76 kg)    VITALS: BP (!) 156/67   Pulse 81   Temp (!) 97.5 F (36.4 C)   Ht 5\' 2"  (1.575 m)   Wt 170 lb (77.1 kg)   SpO2 94%   BMI 31.09 kg/m   EXAM: Deferred  EKG: Deferred  ASSESSMENT: 1. Mixed dyslipidemia, LDL greater than 190-possible FH 2. History of coronary disease status post PCI 3. Statin intolerance 4. Type 2 diabetes 5. Hypertension  PLAN: 1.   [name] has had improvement in her dyslipidemia.  She is pleased with this but frustrated that her diabetes, weight and other parameters are not where she wants him to be.  I think that improvement in these areas could also impact her cholesterol although she will likely need additional medication.  Recommend starting ezetimibe 10 mg daily in addition to her current therapy.  Repeat lipids in 3 months and follow-up afterwards.  Pixie Casino, [name], [facility], [place] Director of the Advanced Lipid Disorders &  Cardiovascular Risk Reduction Clinic Diplomate of the American Board of Clinical Lipidology Attending Cardiologist  Direct Dial: [phone]  Fax: [phone]  Website:  [email or web address] 01/30/2020, 3:16 PM

## 2020-02-05 ENCOUNTER — Encounter: Payer: Self-pay | Admitting: Internal Medicine

## 2020-02-06 ENCOUNTER — Encounter: Payer: Self-pay | Admitting: Internal Medicine

## 2020-02-06 MED ORDER — AZITHROMYCIN 500 MG PO TABS: 500.0000 mg | ORAL_TABLET | Freq: Once | ORAL | 0 refills | Status: DC

## 2020-02-18 ENCOUNTER — Encounter: Payer: Self-pay | Admitting: Internal Medicine

## 2020-02-19 NOTE — Patient Instructions (Addendum)
  Blood work was ordered.     Medications reviewed and updated.  Changes include :   none   A referral for a foot doctor was ordered.  Referred to allergy.   Please followup in 6 months

## 2020-02-19 NOTE — Progress Notes (Signed)
Subjective:    Patient ID: [name], female    DOB: [birth year], 86 y.o.   MRN: [id number]  HPI The patient is here for follow up of their chronic medical problems, including diabetes, HFpEF, htn, hyperlipidemia, COPD, OA, CKD  She is taking all of her medications as prescribed.   She is not exercising regularly.     She follows with Dr Loanne Drilling for her diabetes.     Swelling in right foot and ankle is new x 1 month.  She denies any swelling in the left foot.  The swelling is primarily in the top of the foot and on the lateral aspect of the right foot.  Today she did notice pain in the fifth toe and some slight bleeding that she thinks is from her shoe which has a narrow toe.  The skin is darker in color around the toes and on the top of the foot where the swelling was.  She does have pain when she walks, but no pain at rest.  She denies any new shoes or injuries.  Pain has gotten slightly better since it started.   Shooting pain in feet and legs keep her awake at night.    Medications and allergies reviewed with patient and updated if appropriate.  Patient Active Problem List   Diagnosis Date Noted  . Chronic kidney disease (CKD) 02/20/2020  . Difficulty sleeping 08/01/2019  . Osteoarthritis 08/01/2019  . Abdominal cramping 01/31/2019  . Arthritis of left sacroiliac joint 05/04/2017  . Osteopenia 02/17/2017  . Piriformis syndrome of left side 02/16/2017  . Acquired leg length discrepancy 02/16/2017  . COPD GOLD I  01/27/2017  . Lumbar radiculopathy, acute 01/27/2017  . Granuloma annulare 07/27/2016  . Numbness of fingers 07/27/2016  . Primary osteoarthritis of right knee 06/27/2016  . Diabetes ([place]) 03/07/2016  . OSA on CPAP 08/28/2014  . Chronic diastolic heart failure ([place]) 07/30/2014  . Nocturnal hypoxemia 07/09/2014  . SOB (shortness of breath) 05/15/2014  . Pulmonary HTN ([place]) 05/15/2014  . Crescendo angina ([place]) 05/02/2014  . Band keratopathy 01/16/2014    . 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 Hyperthyroidism 11/04/2011  . Multinodular goiter 10/14/2011  . Atrophy, Fuchs' 09/29/2011  . ATRIAL FIBRILLATION 06/03/2010  . CHF 02/17/2010  . VITILIGO 11/07/2009  . SINOATRIAL NODE DYSFUNCTION 06/16/2009  . Atrial flutter ([place]) 06/25/2008  . HYPERLIPIDEMIA 01/29/2008  . Coronary atherosclerosis 01/29/2008  . HTN (hypertension) 10/30/2007    Current Outpatient Medications on File Prior to Visit  Medication Sig Dispense Refill  . acetaminophen (TYLENOL) 500 MG tablet Take 1,000 mg by mouth every 6 (six) hours as needed (pain).    . CALCIUM-MAG-VIT C-VIT D PO Take 1 tablet by mouth daily.    . carvedilol (COREG) 6.25 MG tablet TAKE 1 TABLET BY MOUTH TWO  TIMES DAILY WITH MEALS 180 tablet 3  . cetirizine (ZYRTEC) 10 MG tablet Take 10 mg by mouth daily. 1 daily    . cholecalciferol (VITAMIN D) 1000 units tablet Take 1,000 Units by mouth daily after supper.    . Cold Sore Products (L-LYSINE EX) Apply topically.    Marland Kitchen ELIQUIS 5 MG TABS tablet TAKE 1 TABLET BY MOUTH  TWICE DAILY 180 tablet 3  . empagliflozin (JARDIANCE) 10 MG TABS tablet Take 10 mg by mouth daily before breakfast. 90 tablet 3  . Evolocumab (REPATHA SURECLICK) [id number] MG/ML SOAJ Inject 1 Dose into the skin every 14 (fourteen) days. 2 pen 11  . ezetimibe (  ZETIA) 10 MG tablet Take 1 tablet (10 mg total) by mouth daily. 90 tablet 3  . furosemide (LASIX) 80 MG tablet TAKE 1 TABLET BY MOUTH TWO  TIMES DAILY 180 tablet 3  . glucose blood (ONETOUCH VERIO) test strip Use to monitor glucose levels 2 times daily 200 each 5  . insulin NPH-regular Human (NOVOLIN 70/30 RELION) (70-30) 100 UNIT/ML injection Inject 20 Units into the skin daily with breakfast. And syringes 1/day 10 mL 11  . Lancets (ONETOUCH ULTRASOFT) lancets Use to monitor glucose levels 2 times daily 200 each 2  . Multiple Vitamin (MULTIVITAMIN WITH MINERALS) TABS tablet Take 1 tablet by mouth daily. Centrum Silver    .  nitroGLYCERIN (NITROSTAT) 0.4 MG SL tablet Place 1 tablet (0.4 mg total) under the tongue every 5 (five) minutes as needed for chest pain. Reported on 01/28/2016 30 tablet 3  . potassium chloride SA (K-DUR) 20 MEQ tablet TAKE 2 TABLETS BY MOUTH  DAILY 180 tablet 3  . tiotropium (SPIRIVA HANDIHALER) 18 MCG inhalation capsule Place 1 capsule (18 mcg total) into inhaler and inhale daily. 90 capsule 1   No current facility-administered medications on file prior to visit.    Past Medical History:  Diagnosis Date  . Anemia    iron defficiency  . Anginal pain ([place])   . Arthritis    "knees, feet, hands; joints" (05/27/2015)  . Asthma   . Atrial fibrillation or flutter    s/p RFCA 7/08;   s/p DCCV in past;   previously on amiodarone;  amio stopped due to lung toxicity  . CAD (coronary artery disease)    s/p NSTEMI tx with BMS to OM1 3/08;  cath 3/08: pOM 99% tx with PCI, pLAD 20%, ? mod stenosis at the AM  . CAP (community acquired pneumonia) 05/24/2015  . Chronic diastolic heart failure (HCC)    echo 11/11:  EF 55-60%, severe LVH, mod LAE, mild MR, mildly increased PASP  . COPD (chronic obstructive pulmonary disease) ([place])   . Degenerative joint disease   . Dystrophy, corneal stromal   . Heart murmur   . HLD (hyperlipidemia)   . HTN (hypertension)    essential nos  . Hypopotassemia    PMH of  . Muscle pain   . Myocardial infarction ([place]) 2008  . OSA on CPAP   . Osteoporosis   . Pneumonia 05/27/2015  . Protein calorie malnutrition ([place])   . Rash and nonspecific skin eruption    both arms,awaiting bio   dr Delman Cheadle  . Seasonal allergies   . Shortness of breath dyspnea   . Type II diabetes mellitus ([place])    Dr Loanne Drilling    Past Surgical History:  Procedure Laterality Date  . A FLUTTER ABLATION     Dr Lovena Le  . CATARACT EXTRACTION W/ INTRAOCULAR LENS  IMPLANT, BILATERAL Bilateral   . CHOLECYSTECTOMY    . COLONOSCOPY  6/07   2 polyps Dr. Kinnie Feil in [place]  . colonoscopy with polypectomy       X 2; [facility]  . CORNEAL TRANSPLANT Bilateral   . CORONARY ANGIOPLASTY WITH STENT PLACEMENT  02/2007   [facility] w/Dr Lovena Le  . EYE SURGERY    . gravid 2 para 2    . KNEE CARTILAGE SURGERY Right   . LEFT AND RIGHT HEART CATHETERIZATION WITH CORONARY ANGIOGRAM N/A 05/07/2014   Procedure: LEFT AND RIGHT HEART CATHETERIZATION WITH CORONARY ANGIOGRAM;  Surgeon: Peter M Martinique, MD;  [facility] C[facility] LAB;  Service: Cardiovascular;  Laterality: N/A;  . TOTAL KNEE ARTHROPLASTY Right 06/28/2016   Procedure: TOTAL KNEE ARTHROPLASTY;  Surgeon: Frederik Pear, MD;  Location: [facility];  Service: Orthopedics;  Laterality: Right;    Social History   Socioeconomic History  . Marital status: Widowed    Spouse name: Not on file  . Number of children: 2  . Years of education: Not on file  . Highest education level: Not on file  Occupational History  . Occupation: Recruitment consultant Retired  Tobacco Use  . Smoking status: Former Smoker    Packs/day: 0.25    Years: 18.00    Pack years: 4.50    Types: Cigarettes    Quit date: 12/27/1968    Years since quitting: 51.1  . Smokeless tobacco: Never Used  . Tobacco comment: smoked [place], up to 1 pp week  Substance and Sexual Activity  . Alcohol use: Yes    Comment: occ  . Drug use: No  . Sexual activity: Never  Other Topics Concern  . Not on file  Social History Narrative   Teacher. Widowed. Rarely drinks cafeine.    Social Determinants of Health   Financial Resource Strain:   . Difficulty of Paying Living Expenses: Not on file  Food Insecurity:   . Worried About Charity fundraiser in the Last Year: Not on file  . Ran Out of Food in the Last Year: Not on file  Transportation Needs:   . Lack of Transportation (Medical): Not on file  . Lack of Transportation (Non-Medical): Not on file  Physical Activity: Insufficiently Active  . Days of Exercise per Week: 4 days  . Minutes of Exercise per Session: 30 min  Stress: No Stress Concern Present  . Feeling of  Stress : Only a little  Social Connections: Slightly Isolated  . Frequency of Communication with Friends and Family: More than three times a week  . Frequency of Social Gatherings with Friends and Family: More than three times a week  . Attends Religious Services: 1 to 4 times per year  . Active Member of Clubs or Organizations: Yes  . Attends Archivist Meetings: 1 to 4 times per year  . Marital Status: Widowed    Family History  Problem Relation Age of Onset  . Diabetes Mother   . Hypertension Mother   . Transient ischemic attack Mother   . Arthritis Mother   . Heart attack Father 72  . Arthritis Father   . Hypertension Father   . Breast cancer Maternal Aunt   . Arthritis Maternal Aunt     Review of Systems  Constitutional: Negative for chills and fever.  Respiratory: Positive for shortness of breath (intermittent - chronic, no change). Negative for cough and wheezing.   Cardiovascular: Positive for chest pain (chest tightness intermittent) and leg swelling (RLE). Negative for palpitations.  Neurological: Negative for light-headedness and headaches.       Objective:   Vitals:   02/20/20 1117  BP: (!) 174/82  Pulse: 83  Resp: 16  Temp: 98 F (36.7 C)  SpO2: 95%   BP Readings from Last 3 Encounters:  02/20/20 (!) 174/82  01/30/20 (!) 156/67  01/09/20 131/78   Wt Readings from Last 3 Encounters:  02/20/20 166 lb 9.6 oz (75.6 kg)  01/30/20 170 lb (77.1 kg)  01/09/20 167 lb 9.6 oz (76 kg)   Body mass index is 30.47 kg/m.   Physical Exam    Constitutional: Appears well-developed and well-nourished. No distress.  HENT:  Head: Normocephalic and atraumatic.  Neck: Neck supple. No tracheal deviation present. No thyromegaly present.  No cervical lymphadenopathy Cardiovascular: Normal rate, regular rhythm and normal heart sounds.   No murmur heard. No carotid bruit .  No edema Pulmonary/Chest: Effort normal and breath sounds normal. No respiratory  distress. No has no wheezes. No rales. Musculoskeletal: Right foot with mild dorsal swelling, no pain with palpation on dorsal aspect of foot or lateral foot.  This is the area that hurts when she walks.  Fifth toe there is some blood around the toenail, no active bleeding, toe is tender-she states this is new since wearing her shoes that are narrow.  No evidence of infection Skin: Skin is warm and dry. Not diaphoretic.  Hyperpigmented patchy rash on bilateral upper legs into hips-diagnosed by dermatology as granuloma annulare Psychiatric: Normal mood and affect. Behavior is normal.      Assessment & Plan:    See Problem List for Assessment and Plan of chronic medical problems.    This visit occurred during the SARS-CoV-2 public health emergency.  Safety protocols were in place, including screening questions prior to the visit, additional usage of staff PPE, and extensive cleaning of exam room while observing appropriate contact time as indicated for disinfecting solutions.

## 2020-02-20 ENCOUNTER — Encounter: Payer: Self-pay | Admitting: Internal Medicine

## 2020-02-20 ENCOUNTER — Ambulatory Visit (INDEPENDENT_AMBULATORY_CARE_PROVIDER_SITE_OTHER): Payer: Medicare Other | Admitting: Internal Medicine

## 2020-02-20 ENCOUNTER — Other Ambulatory Visit: Payer: Self-pay

## 2020-02-20 VITALS — BP 174/82 | HR 83 | Temp 98.0°F | Resp 16 | Ht 62.0 in | Wt 166.6 lb

## 2020-02-20 DIAGNOSIS — N179 Acute kidney failure, unspecified: Secondary | ICD-10-CM | POA: Insufficient documentation

## 2020-02-20 DIAGNOSIS — M79673 Pain in unspecified foot: Secondary | ICD-10-CM | POA: Insufficient documentation

## 2020-02-20 DIAGNOSIS — I5032 Chronic diastolic (congestive) heart failure: Secondary | ICD-10-CM | POA: Diagnosis not present

## 2020-02-20 DIAGNOSIS — E782 Mixed hyperlipidemia: Secondary | ICD-10-CM

## 2020-02-20 DIAGNOSIS — I10 Essential (primary) hypertension: Secondary | ICD-10-CM

## 2020-02-20 DIAGNOSIS — Z794 Long term (current) use of insulin: Secondary | ICD-10-CM

## 2020-02-20 DIAGNOSIS — M79671 Pain in right foot: Secondary | ICD-10-CM

## 2020-02-20 DIAGNOSIS — J449 Chronic obstructive pulmonary disease, unspecified: Secondary | ICD-10-CM | POA: Diagnosis not present

## 2020-02-20 DIAGNOSIS — N1831 Chronic kidney disease, stage 3a: Secondary | ICD-10-CM

## 2020-02-20 DIAGNOSIS — N189 Chronic kidney disease, unspecified: Secondary | ICD-10-CM | POA: Insufficient documentation

## 2020-02-20 DIAGNOSIS — E1121 Type 2 diabetes mellitus with diabetic nephropathy: Secondary | ICD-10-CM

## 2020-02-20 DIAGNOSIS — R21 Rash and other nonspecific skin eruption: Secondary | ICD-10-CM

## 2020-02-20 LAB — COMPREHENSIVE METABOLIC PANEL
ALT: 21 U/L (ref 0–35)
AST: 28 U/L (ref 0–37)
Albumin: 4.1 g/dL (ref 3.5–5.2)
Alkaline Phosphatase: 99 U/L (ref 39–117)
BUN: 23 mg/dL (ref 6–23)
CO2: 28 mEq/L (ref 19–32)
Calcium: 10.5 mg/dL (ref 8.4–10.5)
Chloride: 98 mEq/L (ref 96–112)
Creatinine, Ser: 1.17 mg/dL (ref 0.40–1.20)
GFR: 53.03 mL/min — ABNORMAL LOW (ref >60.00)
Glucose, Bld: 224 mg/dL — ABNORMAL HIGH (ref 70–99)
Potassium: 4.2 mEq/L (ref 3.5–5.1)
Sodium: 136 mEq/L (ref 135–145)
Total Bilirubin: 0.7 mg/dL (ref 0.2–1.2)
Total Protein: 8.1 g/dL (ref 6.0–8.3)

## 2020-02-20 LAB — CBC WITH DIFFERENTIAL/PLATELET
Basophils Absolute: 0.1 10*3/uL (ref 0.0–0.1)
Basophils Relative: 1 % (ref 0.0–3.0)
Eosinophils Absolute: 0.2 10*3/uL (ref 0.0–0.7)
Eosinophils Relative: 4.4 % (ref 0.0–5.0)
HCT: 52.7 % — ABNORMAL HIGH (ref 36.0–46.0)
Hemoglobin: 17.2 g/dL — ABNORMAL HIGH (ref 12.0–15.0)
Lymphocytes Relative: 22.3 % (ref 12.0–46.0)
Lymphs Abs: 1.2 10*3/uL (ref 0.7–4.0)
MCHC: 32.6 g/dL (ref 30.0–36.0)
MCV: 95.6 fl (ref 78.0–100.0)
Monocytes Absolute: 0.6 10*3/uL (ref 0.1–1.0)
Monocytes Relative: 12 % (ref 3.0–12.0)
Neutro Abs: 3.2 10*3/uL (ref 1.4–7.7)
Neutrophils Relative %: 60.3 % (ref 43.0–77.0)
Platelets: 189 10*3/uL (ref 150.0–400.0)
RBC: 5.51 Mil/uL — ABNORMAL HIGH (ref 3.87–5.11)
RDW: 13.7 % (ref 11.5–15.5)
WBC: 5.3 10*3/uL (ref 4.0–10.5)

## 2020-02-20 LAB — LIPID PANEL
Cholesterol: 176 mg/dL (ref 0–200)
HDL: 53.7 mg/dL (ref >39.00)
LDL Cholesterol: 96 mg/dL (ref 0–99)
NonHDL: 122.36
Total CHOL/HDL Ratio: 3
Triglycerides: 132 mg/dL (ref 0.0–149.0)
VLDL: 26.4 mg/dL (ref 0.0–40.0)

## 2020-02-20 NOTE — Assessment & Plan Note (Signed)
Chronic Taking Zetia and was started on Repatha CMP, lipid panel

## 2020-02-20 NOTE — Assessment & Plan Note (Signed)
Chronic Has swelling in her right foot, but none in the left foot-discussed that this is not heart related Appears to be euvolemic Following with cardiology and heart failure clinic Continue current medications CMP

## 2020-02-20 NOTE — Assessment & Plan Note (Signed)
Chronic Blood pressure elevated here today, but better controlled at home Continue current medications CMP

## 2020-02-20 NOTE — Assessment & Plan Note (Signed)
Chronic Following with Dr. Grayling Congress on [place] by Dr. Haroldine Laws We will check A1c Continue current medications Has a follow-up with Dr. Loanne Drilling next month

## 2020-02-20 NOTE — Assessment & Plan Note (Signed)
Chronic shortness of breath with exertion-unchanged Uses Spiriva No coughing or wheezing Continue Spiriva

## 2020-02-20 NOTE — Assessment & Plan Note (Signed)
Chronic Has been stable CMP

## 2020-02-20 NOTE — Assessment & Plan Note (Signed)
Pain and swelling dorsal aspect of distal foot and lateral right foot-only has pain when walking Possible tendinitis There has been some improvement Will refer to podiatry Advised elevating foot, icing and revising activities

## 2020-02-20 NOTE — Assessment & Plan Note (Signed)
Acute on chronic Has granuloma annulare and has seen to dermatologist for this in the past-no obvious cause, but she wonders if it is allergic in nature or something she is ingesting She also has a rash on her buttock region-?  Related or something separate She wonders if this is an allergic reaction to one of her medications or again something she is ingesting She is interested in seeing an allergist-referred

## 2020-02-21 LAB — HEMOGLOBIN A1C: Hgb A1c MFr Bld: 9.2 % — ABNORMAL HIGH (ref 4.6–6.5)

## 2020-02-22 ENCOUNTER — Encounter: Payer: Self-pay | Admitting: Internal Medicine

## 2020-02-22 ENCOUNTER — Encounter: Payer: Self-pay | Admitting: Endocrinology

## 2020-02-23 ENCOUNTER — Encounter: Payer: Self-pay | Admitting: Internal Medicine

## 2020-02-23 DIAGNOSIS — G4733 Obstructive sleep apnea (adult) (pediatric): Secondary | ICD-10-CM

## 2020-02-25 ENCOUNTER — Telehealth: Payer: Self-pay | Admitting: Internal Medicine

## 2020-02-25 ENCOUNTER — Other Ambulatory Visit: Payer: Self-pay

## 2020-02-25 NOTE — Telephone Encounter (Signed)
r foot swollen - started 3 weeks ago. Repatha - last dose this morning without issues.  Recommendation:  1. HOLD Jardiance until follow up with endocrinologist tomorrow morning.  2. HOLD Repatha dose due on March/15, re-challenged on March 29th. Call problems come back, please call to clinic re-assess [place] therapy.

## 2020-02-25 NOTE — Telephone Encounter (Signed)
Spoke with patient and she states her feet are swollen and she has a rash on on buttocks.  She started taking Jardiance and Repatha at the same time and was wondering if this allergic reaction could be related

## 2020-02-26 ENCOUNTER — Ambulatory Visit: Payer: Medicare Other | Admitting: Endocrinology

## 2020-02-26 ENCOUNTER — Encounter: Payer: Self-pay | Admitting: Endocrinology

## 2020-02-26 VITALS — BP 134/64 | HR 93 | Ht 62.0 in | Wt 166.8 lb

## 2020-02-26 DIAGNOSIS — E1121 Type 2 diabetes mellitus with diabetic nephropathy: Secondary | ICD-10-CM | POA: Diagnosis not present

## 2020-02-26 DIAGNOSIS — M79671 Pain in right foot: Secondary | ICD-10-CM

## 2020-02-26 DIAGNOSIS — N183 Chronic kidney disease, stage 3 unspecified: Secondary | ICD-10-CM

## 2020-02-26 DIAGNOSIS — E042 Nontoxic multinodular goiter: Secondary | ICD-10-CM

## 2020-02-26 DIAGNOSIS — M79672 Pain in left foot: Secondary | ICD-10-CM

## 2020-02-26 DIAGNOSIS — E1122 Type 2 diabetes mellitus with diabetic chronic kidney disease: Secondary | ICD-10-CM | POA: Diagnosis not present

## 2020-02-26 DIAGNOSIS — N1831 Chronic kidney disease, stage 3a: Secondary | ICD-10-CM

## 2020-02-26 DIAGNOSIS — Z794 Long term (current) use of insulin: Secondary | ICD-10-CM

## 2020-02-26 LAB — TSH: TSH: 1.93 u[IU]/mL (ref 0.35–4.50)

## 2020-02-26 MED ORDER — NOVOLIN 70/30 RELION (70-30) 100 UNIT/ML ~~LOC~~ SUSP: 30.0000 [IU] | Freq: Every day | SUBCUTANEOUS | 11 refills | Status: DC

## 2020-02-26 NOTE — Patient Instructions (Addendum)
Please increase the insulin to 30 units with breakfast. Please continue the same other medications.  In particular, you should not stop the Repatha.   On this type of insulin schedule, you should eat meals on a regular schedule.  If a meal is missed or significantly delayed, your blood sugar could go low. Thyroid blood tests are requested for you today.  We'll let you know about the results.  Let's check the circulation test.  you will receive a phone call, about a day and time for an appointment. check your blood sugar twice a day.  vary the time of day when you check, between before the 3 meals, and at bedtime.  also check if you have symptoms of your blood sugar being too high or too low.  please keep a record of the readings and bring it to your next appointment here (or you can bring the meter itself).  You can write it on any piece of paper.  please call us sooner if your blood sugar goes below 70, or if you have a lot of readings over 200. Please come back for a follow-up appointment in 2 weeks.

## 2020-02-26 NOTE — Progress Notes (Signed)
Subjective:    Patient ID: [name], female    DOB: Dec 14, 1931, 86 y.o.   MRN: [id number]  HPI Pt returns for f/u of diabetes mellitus:  DM type: Insulin-requiring type 2.   Dx'ed: [id number] Complications: CAD, polyneuropathy, and renal insufficiency.  Therapy: insulin since 2007, and Jardiance.   GDM: never.  DKA: never. Severe hypoglycemia: never.   Pancreatitis: never.   SDOH: she takes human insulin, due to cost.   Other: she changed to qd insulin, after poor results with multiple daily injections; she was changed from lantus to QAM NPH, then to 70/30, due to am hypoglycemia and PM hyperglycemia.   Interval history: she brings a record of her cbg's which I have reviewed today.  cbg varies from 150-183, but most are in the mid-100's.  There is no trend throughout the day.  Pt asks if she should d/c repatha Pt also has hyperthyroidism (dx'ed 2012; [place] then showed multinodular goiter; tapazole was chosen as rx, due to low uptake on nuc med scan; tapazole was stopped, due to rash; plan will be for RAI when this recurs off rx).   Past Medical History:  Diagnosis Date  . Anemia    iron defficiency  . Anginal pain ([place])   . Arthritis    "knees, feet, hands; joints" (05/27/2015)  . Asthma   . Atrial fibrillation or flutter    s/p RFCA 7/08;   s/p DCCV in past;   previously on amiodarone;  amio stopped due to lung toxicity  . CAD (coronary artery disease)    s/p NSTEMI tx with BMS to OM1 3/08;  cath 3/08: pOM 99% tx with PCI, pLAD 20%, ? mod stenosis at the AM  . CAP (community acquired pneumonia) 05/24/2015  . Chronic diastolic heart failure (HCC)    echo 11/11:  EF 55-60%, severe LVH, mod LAE, mild MR, mildly increased PASP  . COPD (chronic obstructive pulmonary disease) ([place])   . Degenerative joint disease   . Dystrophy, corneal stromal   . Heart murmur   . HLD (hyperlipidemia)   . HTN (hypertension)    essential nos  . Hypopotassemia    PMH of  . Muscle pain   .  Myocardial infarction ([place]) 2008  . OSA on CPAP   . Osteoporosis   . Pneumonia 05/27/2015  . Protein calorie malnutrition ([place])   . Rash and nonspecific skin eruption    both arms,awaiting bio   dr Delman Cheadle  . Seasonal allergies   . Shortness of breath dyspnea   . Type II diabetes mellitus ([place])    Dr Loanne Drilling    Past Surgical History:  Procedure Laterality Date  . A FLUTTER ABLATION     Dr Lovena Le  . CATARACT EXTRACTION W/ INTRAOCULAR LENS  IMPLANT, BILATERAL Bilateral   . CHOLECYSTECTOMY    . COLONOSCOPY  6/07   2 polyps Dr. Kinnie Feil in [place]  . colonoscopy with polypectomy      X 2; [facility]  . CORNEAL TRANSPLANT Bilateral   . CORONARY ANGIOPLASTY WITH STENT PLACEMENT  02/2007   BMS w/Dr Lovena Le  . EYE SURGERY    . gravid 2 para 2    . KNEE CARTILAGE SURGERY Right   . LEFT AND RIGHT HEART CATHETERIZATION WITH CORONARY ANGIOGRAM N/A 05/07/2014   Procedure: LEFT AND RIGHT HEART CATHETERIZATION WITH CORONARY ANGIOGRAM;  Surgeon: Peter M Martinique, MD;  Location: [facility] CATH LAB;  Service: Cardiovascular;  Laterality: N/A;  . TOTAL KNEE ARTHROPLASTY Right 06/28/2016  Procedure: TOTAL KNEE ARTHROPLASTY;  Surgeon: Frederik Pear, MD;  Location: [facility];  Service: Orthopedics;  Laterality: Right;    Social History   Socioeconomic History  . Marital status: Widowed    Spouse name: Not on file  . Number of children: 2  . Years of education: Not on file  . Highest education level: Not on file  Occupational History  . Occupation: Recruitment consultant Retired  Tobacco Use  . Smoking status: Former Smoker    Packs/day: 0.25    Years: 18.00    Pack years: 4.50    Types: Cigarettes    Quit date: 12/27/1968    Years since quitting: 51.2  . Smokeless tobacco: Never Used  . Tobacco comment: smoked [place], up to 1 pp week  Substance and Sexual Activity  . Alcohol use: Yes    Comment: occ  . Drug use: No  . Sexual activity: Never  Other Topics Concern  . Not on file  Social History Narrative   Teacher.  Widowed. Rarely drinks cafeine.    Social Determinants of Health   Financial Resource Strain:   . Difficulty of Paying Living Expenses: Not on file  Food Insecurity:   . Worried About Charity fundraiser in the Last Year: Not on file  . Ran Out of Food in the Last Year: Not on file  Transportation Needs:   . Lack of Transportation (Medical): Not on file  . Lack of Transportation (Non-Medical): Not on file  Physical Activity: Insufficiently Active  . Days of Exercise per Week: 4 days  . Minutes of Exercise per Session: 30 min  Stress: No Stress Concern Present  . Feeling of Stress : Only a little  Social Connections: Slightly Isolated  . Frequency of Communication with Friends and Family: More than three times a week  . Frequency of Social Gatherings with Friends and Family: More than three times a week  . Attends Religious Services: 1 to 4 times per year  . Active Member of Clubs or Organizations: Yes  . Attends Archivist Meetings: 1 to 4 times per year  . Marital Status: Widowed  Intimate Partner Violence:   . Fear of Current or Ex-Partner: Not on file  . Emotionally Abused: Not on file  . Physically Abused: Not on file  . Sexually Abused: Not on file    Current Outpatient Medications on File Prior to Visit  Medication Sig Dispense Refill  . acetaminophen (TYLENOL) 500 MG tablet Take 1,000 mg by mouth every 6 (six) hours as needed (pain).    . CALCIUM-MAG-VIT C-VIT D PO Take 1 tablet by mouth daily.    . carvedilol (COREG) 6.25 MG tablet TAKE 1 TABLET BY MOUTH TWO  TIMES DAILY WITH MEALS 180 tablet 3  . cetirizine (ZYRTEC) 10 MG tablet Take 10 mg by mouth daily. 1 daily    . cholecalciferol (VITAMIN D) 1000 units tablet Take 1,000 Units by mouth daily after supper.    . Cold Sore Products (L-LYSINE EX) Apply topically.    Marland Kitchen ELIQUIS 5 MG TABS tablet TAKE 1 TABLET BY MOUTH  TWICE DAILY 180 tablet 3  . empagliflozin (JARDIANCE) 10 MG TABS tablet Take 10 mg by mouth  daily before breakfast. 90 tablet 3  . Evolocumab (REPATHA SURECLICK) [id number] MG/ML SOAJ Inject 1 Dose into the skin every 14 (fourteen) days. 2 pen 11  . ezetimibe (ZETIA) 10 MG tablet Take 1 tablet (10 mg total) by mouth daily. 90 tablet 3  .  furosemide (LASIX) 80 MG tablet TAKE 1 TABLET BY MOUTH TWO  TIMES DAILY 180 tablet 3  . glucose blood (ONETOUCH VERIO) test strip Use to monitor glucose levels 2 times daily 200 each 5  . Lancets (ONETOUCH ULTRASOFT) lancets Use to monitor glucose levels 2 times daily 200 each 2  . Multiple Vitamin (MULTIVITAMIN WITH MINERALS) TABS tablet Take 1 tablet by mouth daily. Centrum Silver    . nitroGLYCERIN (NITROSTAT) 0.4 MG SL tablet Place 1 tablet (0.4 mg total) under the tongue every 5 (five) minutes as needed for chest pain. Reported on 01/28/2016 30 tablet 3  . potassium chloride SA (K-DUR) 20 MEQ tablet TAKE 2 TABLETS BY MOUTH  DAILY 180 tablet 3  . tiotropium (SPIRIVA HANDIHALER) 18 MCG inhalation capsule Place 1 capsule (18 mcg total) into inhaler and inhale daily. 90 capsule 1   No current facility-administered medications on file prior to visit.    Allergies  Allergen Reactions  . Amlodipine Besylate Other (See Comments)    REACTION: tingling in lips & gum edema  . Levaquin [Levofloxacin] Shortness Of Breath and Swelling    angioedema  . Lobster [Shellfish Allergy] Other (See Comments)    angioedema  . Penicillins Rash    Has patient had a PCN reaction causing immediate rash, facial/tongue/throat swelling, SOB or lightheadedness with hypotension: Yes Has patient had a PCN reaction causing severe rash involving mucus membranes or skin necrosis: No Has patient had a PCN reaction that required hospitalization: No Has patient had a PCN reaction occurring within the last 10 years: No If all of the above answers are "NO", then may proceed with Cephalosporin use.  . Valsartan Other (See Comments)    REACTION: angioedema  . Codeine Other (See Comments)     Mental status changes  . Lipitor [Atorvastatin] Other (See Comments)    weakness  . Oxycodone Other (See Comments)    hallucinations  . Statins     Made her too weak  . Tramadol   . Crestor [Rosuvastatin Calcium] Rash  . Tramadol Hcl Nausea And Vomiting    Family History  Problem Relation Age of Onset  . Diabetes Mother   . Hypertension Mother   . Transient ischemic attack Mother   . Arthritis Mother   . Heart attack Father 83  . Arthritis Father   . Hypertension Father   . Breast cancer Maternal Aunt   . Arthritis Maternal Aunt     BP 134/64 (BP Location: Left Arm, Patient Position: Sitting, Cuff Size: Large)   Pulse 93   Ht 5\' 2"  (1.575 m)   Wt 166 lb 12.8 oz (75.7 kg)   SpO2 92%   BMI 30.51 kg/m    Review of Systems She denies hypoglycemia.  She has nocturnal leg and foot pain.      Objective:   Physical Exam VITAL SIGNS:  See vs page GENERAL: no distress Pulses: dorsalis pedis intact bilat.   MSK: no deformity of the feet CV: no leg edema Skin:  no ulcer on the feet.  normal temp on the feet.  There is erythema and hyperpigmentation of the feet.   Neuro: sensation is intact to touch on the feet Ext: there is bilateral onychomycosis of the toenails.    Lab Results  Component Value Date   HGBA1C 9.2 (H) 02/20/2020      Assessment & Plan:  Insulin-requiring type 2 DM, with CAD: worse Renal insuff: This limits rx options Erythema of the feet: check ABI's Hyperthyroidism,  in remission: recheck today.  Patient Instructions  Please increase the insulin to 30 units with breakfast. Please continue the same other medications.  In particular, you should not stop the Repatha.   On this type of insulin schedule, you should eat meals on a regular schedule.  If a meal is missed or significantly delayed, your blood sugar could go low. Thyroid blood tests are requested for you today.  We'll let you know about the results.  Let's check the circulation test.  you  will receive a phone call, about a day and time for an appointment. check your blood sugar twice a day.  vary the time of day when you check, between before the 3 meals, and at bedtime.  also check if you have symptoms of your blood sugar being too high or too low.  please keep a record of the readings and bring it to your next appointment here (or you can bring the meter itself).  You can write it on any piece of paper.  please call us sooner if your blood sugar goes below 70, or if you have a lot of readings over 200. Please come back for a follow-up appointment in 2 weeks.

## 2020-02-27 ENCOUNTER — Telehealth (HOSPITAL_COMMUNITY): Payer: Self-pay

## 2020-02-27 ENCOUNTER — Encounter: Payer: Self-pay | Admitting: Endocrinology

## 2020-02-27 NOTE — Telephone Encounter (Signed)

## 2020-02-27 NOTE — Telephone Encounter (Signed)
  02/27/2020: [place] with Kieth Brightly at Dr Cordelia Pen office she went back and spoke with Dr Loanne Drilling, he gave permission to change order to ABI.  [facility]

## 2020-02-29 ENCOUNTER — Other Ambulatory Visit: Payer: Self-pay

## 2020-02-29 ENCOUNTER — Other Ambulatory Visit: Payer: Self-pay | Admitting: Endocrinology

## 2020-02-29 ENCOUNTER — Ambulatory Visit (HOSPITAL_COMMUNITY)
Admission: RE | Admit: 2020-02-29 | Discharge: 2020-02-29 | Disposition: A | Discharge status: Home / Self Care | Payer: Medicare Other | Source: Ambulatory Visit | Attending: Endocrinology | Admitting: Endocrinology

## 2020-02-29 ENCOUNTER — Other Ambulatory Visit (HOSPITAL_COMMUNITY): Payer: Self-pay | Admitting: Internal Medicine

## 2020-02-29 DIAGNOSIS — M79672 Pain in left foot: Secondary | ICD-10-CM | POA: Diagnosis not present

## 2020-02-29 DIAGNOSIS — I739 Peripheral vascular disease, unspecified: Secondary | ICD-10-CM | POA: Insufficient documentation

## 2020-02-29 DIAGNOSIS — M79671 Pain in right foot: Secondary | ICD-10-CM | POA: Insufficient documentation

## 2020-03-03 ENCOUNTER — Telehealth: Payer: Self-pay

## 2020-03-03 NOTE — Progress Notes (Signed)
@Patient  ID: [name], female    DOB: 15-Jul-1931, 86 y.o.   MRN: [id number]  Chief Complaint  Patient presents with  . Follow-up    Wants to discuss cpap options, states she has struggled with her current cpap machine.     Referring provider: [name], MD  HPI:  86 year old female followed in our office for severe obstructive sleep apnea and COPD  PMH: A. fib, CAD, hypertension, hyperlipidemia, chronic diastolic dysfunction, type 2 diabetes, allergies Smoker/ Smoking History: Former smoker Maintenance:  Spiriva Handihaler 32mcg  Pt of: Dr. Halford Chessman  03/04/2020  - Visit   86 year old female former smoker followed in our office for severe obstructive sleep apnea and COPD. She was last seen in November/2018 by Dr. Halford Chessman. At that time patient was struggling with CPAP management. A CPAP mask fitting was ordered. Patient was referred back to our office today by primary care to discuss CPAP options.  Patient continues struggle with CPAP and eventually stopped using it.  Unfortunately patient is unable to use an oral appliance due to having a partial plate.  Do not believe that surgical interventions would be helpful at this point in time.  Patient is interested in resuming CPAP therapy.  She is aware that she will need to be retested.  Last pulmonary function test in 2016 did show COPD.  Patient with emphysema on imaging.  She is currently managed on Spiriva HandiHaler.  She is unsure if this is the best inhaler for her.  She feels that her breathing has worsened over the last 2 years since last being seen.  She is unsure if this is due to COPD worsening or whether it is one of her other comorbidities.  She also has not been as active with the coronavirus pandemic.  Prior to this she was doing water aerobics and exercises at the [facility] routinely every week.  Patient denies any increased cough, congestion or wheezing.  Pulmonary tests PFT 05/21/14 >> FEV1 1.37 (94%), FEV1% 66, TLC 4.69  (95%), DLCO 39% CT chest 05/24/15 >> mild centrilobular emphysema CT chest 05/25/17 >> moderate centrilobular emphysema, atherosclerosis  Sleep tests PSG 7/14//15 >> AHI 37 HST 02/21/17 >> AHI 30.5, SaO2 low 78% Auto CPAP 10/23/17 to 11/21/17 >> used on 30 of 30 nights with average 6 hrs 34 min.  Average AHI 1 with median CPAP 8 and 95 th percentile CPAP 11 cm H2O  Cardiac tests Echo 10/31/17 >> EF 65 to 70%, grade 3 DD, mild MR   Questionaires / Pulmonary Flowsheets:   MMRC: mMRC Dyspnea Scale mMRC Score  03/04/2020 2    Epworth:  Results of the Epworth flowsheet 03/04/2020  Sitting and reading 1  Watching TV 1  Sitting, inactive in a public place (e.g. a theatre or a meeting) 0  As a passenger in a car for an hour without a break 0  Lying down to rest in the afternoon when circumstances permit 3  Sitting and talking to someone 0  Sitting quietly after a lunch without alcohol 1  In a car, while stopped for a few minutes in traffic 0  Total score 6    Tests:   Pulmonary tests PFT 05/21/14 >> FEV1 1.37 (94%), FEV1% 66, TLC 4.69 (95%), DLCO 39% CT chest 05/24/15 >> mild centrilobular emphysema CT chest 05/25/17 >> moderate centrilobular emphysema, atherosclerosis  Sleep tests PSG 7/14//15 >> AHI 37 HST 02/21/17 >> AHI 30.5, SaO2 low 78%  Cardiac tests Echo 10/31/17 >> EF 65  to 70%, grade 3 DD, mild MR   FENO:  No results found for: NITRICOXIDE  PFT: PFT Results Latest Ref Rng & Units 05/21/2014  FVC-Pre L 2.10  FVC-Predicted Pre % 110  FVC-Post L 2.09  FVC-Predicted Post % 110  Pre FEV1/FVC % % 67  Post FEV1/FCV % % 66  FEV1-Pre L 1.40  FEV1-Predicted Pre % 95  FEV1-Post L 1.37  DLCO UNC% % 39  DLCO COR %Predicted % 54  TLC L 4.69  TLC % Predicted % 95  RV % Predicted % 117    WALK:  SIX MIN WALK 03/04/2020  Supplimental Oxygen during Test? (L/min) No  Tech Comments: Patient was only able to complete 1 lap. Towards the end of the lap, she stated that she was a  little tired and SOB but she wanted to complete her lap. She walked at a steady pace with a cane. No O2 needed during or after walk.    Imaging: VAS [place] ABI WITH/WO TBI  Result Date: 03/03/2020 LOWER EXTREMITY DOPPLER STUDY Indications: Claudication, peripheral artery disease, and Bilateral foot pain              and discoloration. High Risk Factors: Hypertension, Diabetes, past history of smoking.  Performing Technologist: Delorise Shiner RVT  Examination Guidelines: A complete evaluation includes at minimum, Doppler waveform signals and systolic blood pressure reading at the level of bilateral brachial, anterior tibial, and posterior tibial arteries, when vessel segments are accessible. Bilateral testing is considered an integral part of a complete examination. Photoelectric Plethysmograph (PPG) waveforms and toe systolic pressure readings are included as required and additional duplex testing as needed. Limited examinations for reoccurring indications may be performed as noted.  ABI Findings: +---------+------------------+-----+----------+--------+ Right    Rt Pressure (mmHg)IndexWaveform  Comment  +---------+------------------+-----+----------+--------+ Brachial 172                                       +---------+------------------+-----+----------+--------+ ATA      62                0.36                    +---------+------------------+-----+----------+--------+ PTA      72                0.42 monophasic         +---------+------------------+-----+----------+--------+ DP                              monophasic         +---------+------------------+-----+----------+--------+ Great Toe11                0.06                    +---------+------------------+-----+----------+--------+ +---------+------------------+-----+----------+-------+ Left     Lt Pressure (mmHg)IndexWaveform  Comment +---------+------------------+-----+----------+-------+ Brachial 170                                       +---------+------------------+-----+----------+-------+ ATA      69                0.40                   +---------+------------------+-----+----------+-------+ PTA  72                0.42 monophasic        +---------+------------------+-----+----------+-------+ DP                              monophasic        +---------+------------------+-----+----------+-------+ Great Toe37                0.22                   +---------+------------------+-----+----------+-------+ +-------+-----------+-----------+------------+------------+ ABI/TBIToday's ABIToday's TBIPrevious ABIPrevious TBI +-------+-----------+-----------+------------+------------+ Right  0.42       0.06                                +-------+-----------+-----------+------------+------------+ Left   0.42       0.22                                +-------+-----------+-----------+------------+------------+  Summary: Right: Resting right ankle-brachial index indicates severe right lower extremity arterial disease. The right toe-brachial index is abnormal. RT great toe pressure = 11 mmHg. Left: Resting left ankle-brachial index indicates severe left lower extremity arterial disease. The left toe-brachial index is abnormal. LT Great toe pressure = 37 mmHg.  *See table(s) above for measurements and observations.  Electronically signed by Harold Barban MD on 03/03/2020 at 4:35:36 PM.    Final     Lab Results:  CBC    Component Value Date/Time   WBC 5.3 02/20/2020 1239   RBC 5.51 (H) 02/20/2020 1239   HGB 17.2 (H) 02/20/2020 1239   HCT 52.7 (H) 02/20/2020 1239   PLT 189.0 02/20/2020 1239   MCV 95.6 02/20/2020 1239   MCH 30.4 01/09/2020 1215   MCHC 32.6 02/20/2020 1239   RDW 13.7 02/20/2020 1239   LYMPHSABS 1.2 02/20/2020 1239   MONOABS 0.6 02/20/2020 1239   EOSABS 0.2 02/20/2020 1239   BASOSABS 0.1 02/20/2020 1239    BMET    Component Value Date/Time   NA 136  02/20/2020 1239   NA 138 07/02/2016 0000   NA 138 07/02/2016 0000   K 4.2 02/20/2020 1239   CL 98 02/20/2020 1239   CO2 28 02/20/2020 1239   GLUCOSE 224 (H) 02/20/2020 1239   BUN 23 02/20/2020 1239   BUN 25 (A) 07/02/2016 0000   BUN 25 (A) 07/02/2016 0000   CREATININE 1.17 02/20/2020 1239   CALCIUM 10.5 02/20/2020 1239   GFRNONAA 46 (L) 01/09/2020 1215   GFRAA 54 (L) 01/09/2020 1215    BNP    Component Value Date/Time   BNP 490.1 (H) 02/23/2018 1229    ProBNP    Component Value Date/Time   PROBNP 453.0 (H) 01/30/2018 1236    Specialty Problems      Pulmonary Problems   SOB (shortness of breath)   Nocturnal hypoxemia   OSA     NPSG 07/2014:  AHI 37/hr.       COPD GOLD I     - quit smoking 1970 - PFT's  05/21/14  FEV1 1.37 (94 % ) ratio 66  p no % improvement from saba p ? prior to study with DLCO  39 % corrects to 54  % for alv volume  With classic curvature  -06/02/2017  After extensive coaching HFA effectiveness =  50%  So rx symbicort 80 mg 2bid> d/c 07/06/17 and no worse off it by 10/10/2017 so pulmonary f/u prn          Allergies  Allergen Reactions  . Amlodipine Besylate Other (See Comments)    REACTION: tingling in lips & gum edema  . Levaquin [Levofloxacin] Shortness Of Breath and Swelling    angioedema  . Lobster [Shellfish Allergy] Other (See Comments)    angioedema  . Penicillins Rash    Has patient had a PCN reaction causing immediate rash, facial/tongue/throat swelling, SOB or lightheadedness with hypotension: Yes Has patient had a PCN reaction causing severe rash involving mucus membranes or skin necrosis: No Has patient had a PCN reaction that required hospitalization: No Has patient had a PCN reaction occurring within the last 10 years: No If all of the above answers are "NO", then may proceed with Cephalosporin use.  . Valsartan Other (See Comments)    REACTION: angioedema  . Codeine Other (See Comments)    Mental status changes  . Lipitor  [Atorvastatin] Other (See Comments)    weakness  . Oxycodone Other (See Comments)    hallucinations  . Statins     Made her too weak  . Tramadol   . Crestor [Rosuvastatin Calcium] Rash  . Tramadol Hcl Nausea And Vomiting    Immunization History  Administered Date(s) Administered  . Fluad Quad(high Dose 65+) 08/22/2019  . Influenza Split 11/16/2011, 09/26/2012  . Influenza Whole 12/27/2004, 10/21/2008, 10/20/2009, 11/11/2010  . Influenza, High Dose Seasonal PF 10/14/2016, 10/10/2017, 09/28/2018  . Influenza,inj,Quad PF,6+ Mos 09/24/2013, 12/16/2014, 09/19/2015  . Moderna SARS-COVID-2 Vaccination 02/08/2020  . PPD Test 05/31/2015, 07/07/2016  . Pneumococcal Conjugate-13 07/31/2018  . Pneumococcal Polysaccharide-23 12/16/2014  . Pneumococcal-Unspecified 05/31/2015  . Td 06/15/2010    Past Medical History:  Diagnosis Date  . Anemia    iron defficiency  . Anginal pain ([place])   . Arthritis    "knees, feet, hands; joints" (05/27/2015)  . Asthma   . Atrial fibrillation or flutter    s/p RFCA 7/08;   s/p DCCV in past;   previously on amiodarone;  amio stopped due to lung toxicity  . CAD (coronary artery disease)    s/p NSTEMI tx with BMS to OM1 3/08;  cath 3/08: pOM 99% tx with PCI, pLAD 20%, ? mod stenosis at the AM  . CAP (community acquired pneumonia) 05/24/2015  . Chronic diastolic heart failure (HCC)    echo 11/11:  EF 55-60%, severe LVH, mod LAE, mild MR, mildly increased PASP  . COPD (chronic obstructive pulmonary disease) ([place])   . Degenerative joint disease   . Dystrophy, corneal stromal   . Heart murmur   . HLD (hyperlipidemia)   . HTN (hypertension)    essential nos  . Hypopotassemia    PMH of  . Muscle pain   . Myocardial infarction ([place]) 2008  . OSA on CPAP   . Osteoporosis   . Pneumonia 05/27/2015  . Protein calorie malnutrition ([place])   . Rash and nonspecific skin eruption    both arms,awaiting bio   dr Delman Cheadle  . Seasonal allergies   . Shortness of breath  dyspnea   . Type II diabetes mellitus ([place])    Dr Loanne Drilling    Tobacco History: Social History   Tobacco Use  Smoking Status Former Smoker  . Packs/day: 0.25  . Years: 18.00  . Pack years: 4.50  . Types: Cigarettes  . Quit date: 12/27/1968  . Years since  quitting: 51.2  Smokeless Tobacco Never Used  Tobacco Comment   smoked [place], up to 1 pp week   Counseling given: Yes Comment: smoked [place], up to 1 pp week   Continue to not smoke  Outpatient Encounter Medications as of 03/04/2020  Medication Sig  . acetaminophen (TYLENOL) 500 MG tablet Take 1,000 mg by mouth every 6 (six) hours as needed (pain).  . CALCIUM-MAG-VIT C-VIT D PO Take 1 tablet by mouth daily.  . carvedilol (COREG) 6.25 MG tablet TAKE 1 TABLET BY MOUTH TWO  TIMES DAILY WITH MEALS  . cetirizine (ZYRTEC) 10 MG tablet Take 10 mg by mouth daily. 1 daily  . cholecalciferol (VITAMIN D) 1000 units tablet Take 1,000 Units by mouth daily after supper.  . Cold Sore Products (L-LYSINE EX) Apply topically.  Marland Kitchen ELIQUIS 5 MG TABS tablet TAKE 1 TABLET BY MOUTH  TWICE DAILY  . empagliflozin (JARDIANCE) 10 MG TABS tablet Take 10 mg by mouth daily before breakfast.  . Evolocumab (REPATHA SURECLICK) [id number] MG/ML SOAJ Inject 1 Dose into the skin every 14 (fourteen) days.  Marland Kitchen ezetimibe (ZETIA) 10 MG tablet Take 1 tablet (10 mg total) by mouth daily.  . furosemide (LASIX) 80 MG tablet TAKE 1 TABLET BY MOUTH TWO  TIMES DAILY  . glucose blood (ONETOUCH VERIO) test strip Use to monitor glucose levels 2 times daily  . insulin NPH-regular Human (NOVOLIN 70/30 RELION) (70-30) 100 UNIT/ML injection Inject 30 Units into the skin daily with breakfast. And syringes 1/day  . Lancets (ONETOUCH ULTRASOFT) lancets Use to monitor glucose levels 2 times daily  . Multiple Vitamin (MULTIVITAMIN WITH MINERALS) TABS tablet Take 1 tablet by mouth daily. Centrum Silver  . nitroGLYCERIN (NITROSTAT) 0.4 MG SL tablet Place 1 tablet (0.4 mg total) under the  tongue every 5 (five) minutes as needed for chest pain. Reported on 01/28/2016  . potassium chloride SA (K-DUR) 20 MEQ tablet TAKE 2 TABLETS BY MOUTH  DAILY  . tiotropium (SPIRIVA HANDIHALER) 18 MCG inhalation capsule Place 1 capsule (18 mcg total) into inhaler and inhale daily.   No facility-administered encounter medications on file as of 03/04/2020.     Review of Systems  Review of Systems  Constitutional: Positive for fatigue. Negative for activity change and fever.  HENT: Negative for sinus pressure, sinus pain and sore throat.   Respiratory: Positive for shortness of breath. Negative for cough and wheezing.   Cardiovascular: Negative for chest pain and palpitations.  Gastrointestinal: Negative for diarrhea, nausea and vomiting.  Musculoskeletal: Negative for arthralgias.  Neurological: Negative for dizziness.  Psychiatric/Behavioral: Positive for sleep disturbance. The patient is not nervous/anxious.      Physical Exam  BP 126/82 (BP Location: Left Arm, Patient Position: Sitting, Cuff Size: Normal)   Pulse 80   Temp 98.4 F (36.9 C) (Temporal)   Ht 5\' 2"  (1.575 m)   Wt 170 lb (77.1 kg)   SpO2 96% Comment: on RA  BMI 31.09 kg/m   Wt Readings from Last 5 Encounters:  03/04/20 170 lb (77.1 kg)  02/26/20 166 lb 12.8 oz (75.7 kg)  02/20/20 166 lb 9.6 oz (75.6 kg)  01/30/20 170 lb (77.1 kg)  01/09/20 167 lb 9.6 oz (76 kg)    BMI Readings from Last 5 Encounters:  03/04/20 31.09 kg/m  02/26/20 30.51 kg/m  02/20/20 30.47 kg/m  01/30/20 31.09 kg/m  01/09/20 29.69 kg/m     Physical Exam Vitals and nursing note reviewed.  Constitutional:  General: She is not in acute distress.    Appearance: Normal appearance. She is obese.  HENT:     Head: Normocephalic and atraumatic.     Right Ear: External ear normal.     Left Ear: External ear normal.     Nose: Nose normal. No congestion or rhinorrhea.     Mouth/Throat:     Mouth: Mucous membranes are dry.      Pharynx: Oropharynx is clear.     Comments: mallampati III Eyes:     Pupils: Pupils are equal, round, and reactive to light.  Cardiovascular:     Rate and Rhythm: Normal rate and regular rhythm.     Pulses: Normal pulses.     Heart sounds: Normal heart sounds. No murmur.  Pulmonary:     Effort: Pulmonary effort is normal. No respiratory distress.     Breath sounds: No decreased air movement. No decreased breath sounds, wheezing or rales.  Musculoskeletal:     Cervical back: Normal range of motion.  Skin:    General: Skin is warm and dry.     Capillary Refill: Capillary refill takes less than 2 seconds.  Neurological:     General: No focal deficit present.     Mental Status: She is alert and oriented to person, place, and time. Mental status is at baseline.     Gait: Gait abnormal.  Psychiatric:        Mood and Affect: Mood normal.        Behavior: Behavior normal.        Thought Content: Thought content normal.        Judgment: Judgment normal.       Assessment & Plan:   Chronic diastolic heart failure (HCC) Chronic diastolic heart failure Appears euvolemic today  Plan: Continue follow-up with cardiology and primary care Keep follow-up with heart failure clinic  Pulmonary HTN ([place]) Plan: Home sleep study today to hopefully resume CPAP therapy Pulmonary function testing ordered today Continue Spiriva Walk in office today  COPD GOLD I  Plan: Pulmonary function testing ordered today Continue Spiriva HandiHaler at this time Can consider additional inhalers or inhaler change after pulmonary function test is complete  OSA  History of severe obstructive sleep apnea Previously intolerant to CPAP, used full facemask May be good candidate for nasal mask Epworth score today 6  Plan: Home sleep study ordered Plans to resume CPAP therapy after home sleep study is complete    Return in about 2 months (around 05/04/2020), or if symptoms worsen or fail to improve, for  Follow up with Dr. Halford Chessman, Follow up with Wyn Quaker FNP-C, Follow up for PFT.   Lauraine Rinne, NP 03/04/2020   This appointment required 32 minutes of patient care (this includes precharting, chart review, review of results, face-to-face care, etc.).

## 2020-03-03 NOTE — Telephone Encounter (Signed)
IMAGING RESULTS  Imaging results were reviewed by Dr. Loanne Drilling. Called pt to inform about imaging results as well as new referral order. Using closed-loop communication, pt verbalized complete acceptance and understanding of all information provided, including referral appointment scheduling process. No further questions nor concerns were voiced at this time. Routing this message to Linus Galas, CMA for referral scheduling purposes.  Referral Referral # [id number] Referral Information  Referral # Creation Date Referral Status Status Update  [id number] 02/29/2020 New Request 02/29/2020: Status History  Status Reason Referral Type Referral Reasons Referral Class  none Surgical Specialty Services Required Internal  To Specialty To Provider To Location/Place of Service To Department  Vascular Surgery none none none  To Vendor Referred By By Location/Place of Service By Department  none Renato Shin, MD [facility]  Priority Start Date Expiration Date Referral Entered By  Routine 02/29/2020 02/28/2021 Renato Shin, MD  Visits Requested Visits Authorized Visits Completed Visits Scheduled  1 1    Procedure Information  Service Details Procedure Modifiers Provider Requested Approved  [id number] - AMB REFERRAL TO VASCULAR SURGERY/CLINIC None  1 1  Diagnosis Information  Diagnosis  I73.9 (ICD-10-CM) - PAD (peripheral artery disease) [facility])  Referral Order  Order  Ambulatory referral to Vascular Surgery (Order # [id number]) on 02/29/2020  View Encounter

## 2020-03-03 NOTE — Telephone Encounter (Signed)
-----   Message from Renato Shin, MD sent at 02/29/2020  4:30 PM EST ----- please contact patient: The circulation is not good.  Please see a specialist.  you will receive a phone call, about a day and time for an appointment

## 2020-03-04 ENCOUNTER — Other Ambulatory Visit: Payer: Self-pay

## 2020-03-04 ENCOUNTER — Ambulatory Visit: Payer: Medicare Other | Admitting: Pulmonary Disease

## 2020-03-04 ENCOUNTER — Encounter: Payer: Self-pay | Admitting: Pulmonary Disease

## 2020-03-04 VITALS — BP 126/82 | HR 80 | Temp 98.4°F | Ht 62.0 in | Wt 170.0 lb

## 2020-03-04 DIAGNOSIS — Z9989 Dependence on other enabling machines and devices: Secondary | ICD-10-CM

## 2020-03-04 DIAGNOSIS — J449 Chronic obstructive pulmonary disease, unspecified: Secondary | ICD-10-CM

## 2020-03-04 DIAGNOSIS — I5032 Chronic diastolic (congestive) heart failure: Secondary | ICD-10-CM | POA: Diagnosis not present

## 2020-03-04 DIAGNOSIS — G4733 Obstructive sleep apnea (adult) (pediatric): Secondary | ICD-10-CM

## 2020-03-04 DIAGNOSIS — I272 Pulmonary hypertension, unspecified: Secondary | ICD-10-CM

## 2020-03-04 NOTE — Assessment & Plan Note (Signed)
Plan: Pulmonary function testing ordered today Continue Spiriva HandiHaler at this time Can consider additional inhalers or inhaler change after pulmonary function test is complete

## 2020-03-04 NOTE — Progress Notes (Signed)
Reviewed and agree with assessment/plan.   Raima Geathers, MD [facility] 12/22/2016, 12:24 PM Pager:  [phone]  

## 2020-03-04 NOTE — Patient Instructions (Addendum)
You were seen today by Lauraine Rinne, NP  for:   It was nice meeting you today.  We will order a home sleep study as well as a breathing test to further evaluate your breathing.  Please contact us if you have any additional questions or worsened symptoms with your breathing.  We hope to see you back in May/2021 with the breathing test.  Work on increasing your activity as discussed today.  Aaron Edelman  1. OSA   - Home sleep test; Future  2. COPD mixed type ([place])  - Pulmonary function test; Future  Spiriva Handihaler 9mcg capsule >>> take 2 puffs using the inhaler and 1 capsule daily  >>> rinse mouth out after use  >>> take daily no matter what  >>> this is not a rescue inhaler   Note your daily symptoms > remember "red flags" for COPD:   >>>Increase in cough >>>increase in sputum production >>>increase in shortness of breath or activity  intolerance.   If you notice these symptoms, please call the office to be seen.     3. Pulmonary HTN ([place])  We will work on managing the pressures in your heart by restarting your CPAP therapy as well as adequately managing your breathing  Keep follow-up with cardiology  4. Chronic diastolic heart failure ([place])  Continue to work on weighing yourself  Increase physical activity  Keep follow-up with cardiology   We recommend today:  Orders Placed This Encounter  Procedures  . Pulmonary function test    Standing Status:   Future    Standing Expiration Date:   03/04/2021    Order Specific Question:   Where should this test be performed?    Answer:   [facility]    Order Specific Question:   Full PFT: includes the following: basic spirometry, spirometry pre & post bronchodilator, diffusion capacity (DLCO), lung volumes    Answer:   Full PFT  . Home sleep test    Standing Status:   Future    Standing Expiration Date:   03/04/2021    Order Specific Question:   Where should this test be performed:    Answer:   [facility]   Orders  Placed This Encounter  Procedures  . Pulmonary function test  . Home sleep test   No orders of the defined types were placed in this encounter.   Follow Up:    Return in about 2 months (around 05/04/2020), or if symptoms worsen or fail to improve, for Follow up with Dr. Halford Chessman, Follow up with Wyn Quaker FNP-C, Follow up for PFT.   Please do your part to reduce the spread of COVID-19:      Reduce your risk of any infection  and COVID19 by using the similar precautions used for avoiding the common cold or flu:  Marland Kitchen Wash your hands often with soap and warm water for at least 20 seconds.  If soap and water are not readily available, use an alcohol-based hand sanitizer with at least 60% alcohol.  . If coughing or sneezing, cover your mouth and nose by coughing or sneezing into the elbow areas of your shirt or coat, into a tissue or into your sleeve (not your hands). Langley Gauss A MASK when in public  . Avoid shaking hands with others and consider head nods or verbal greetings only. . Avoid touching your eyes, nose, or mouth with unwashed hands.  . Avoid close contact with people who are sick. . Avoid places or  events with large numbers of people in one location, like concerts or sporting events. . If you have some symptoms but not all symptoms, continue to monitor at home and seek medical attention if your symptoms worsen. . If you are having a medical emergency, call 911.   [place] / e-Visit: [email or web address]         [facility]: [facility]: [id number]                   [facility]: [id number]     It is flu season:   >>> Best ways to protect herself from the flu: Receive the yearly flu vaccine, practice good hand hygiene washing with soap and also using hand sanitizer when available, eat a nutritious meals, get adequate rest,  hydrate appropriately   Please contact the office if your symptoms worsen or you have concerns that you are not improving.   Thank you for choosing [facility] for your healthcare, and for allowing [place] to partner with you on your healthcare journey. I am thankful to be able to provide care to you today.   Wyn Quaker FNP-C

## 2020-03-04 NOTE — Assessment & Plan Note (Signed)
Chronic diastolic heart failure Appears euvolemic today  Plan: Continue follow-up with cardiology and primary care Keep follow-up with heart failure clinic

## 2020-03-04 NOTE — Assessment & Plan Note (Signed)
Plan: Home sleep study today to hopefully resume CPAP therapy Pulmonary function testing ordered today Continue Spiriva Walk in office today

## 2020-03-04 NOTE — Assessment & Plan Note (Signed)
History of severe obstructive sleep apnea Previously intolerant to CPAP, used full facemask May be good candidate for nasal mask Epworth score today 6  Plan: Home sleep study ordered Plans to resume CPAP therapy after home sleep study is complete

## 2020-03-05 ENCOUNTER — Encounter: Payer: Self-pay | Admitting: Endocrinology

## 2020-03-05 NOTE — Telephone Encounter (Signed)
Please respond to pt with status update

## 2020-03-06 ENCOUNTER — Telehealth: Payer: Self-pay | Admitting: Internal Medicine

## 2020-03-06 NOTE — Telephone Encounter (Signed)
PA for Repatha Sureclick submitted via [place] (Key: [id number])

## 2020-03-07 NOTE — Telephone Encounter (Signed)
Request Reference Number: [id number]. REPATHA SURE INJ 140MG /ML is approved through 12/26/2020

## 2020-03-10 ENCOUNTER — Other Ambulatory Visit: Payer: Self-pay

## 2020-03-12 ENCOUNTER — Ambulatory Visit: Payer: Medicare Other | Admitting: Endocrinology

## 2020-03-12 ENCOUNTER — Other Ambulatory Visit: Payer: Self-pay

## 2020-03-12 ENCOUNTER — Encounter: Payer: Self-pay | Admitting: Endocrinology

## 2020-03-12 VITALS — BP 130/70 | HR 85 | Ht 62.0 in | Wt 169.6 lb

## 2020-03-12 DIAGNOSIS — I739 Peripheral vascular disease, unspecified: Secondary | ICD-10-CM | POA: Diagnosis not present

## 2020-03-12 DIAGNOSIS — R6889 Other general symptoms and signs: Secondary | ICD-10-CM | POA: Diagnosis not present

## 2020-03-12 NOTE — Patient Instructions (Addendum)
Please continue the same insulin On this type of insulin schedule, you should eat meals on a regular schedule.  If a meal is missed or significantly delayed, your blood sugar could go low. Please see a specialist, about the circulation.  you will receive a phone call, about a day and time for an appointment. check your blood sugar twice a day.  vary the time of day when you check, between before the 3 meals, and at bedtime.  also check if you have symptoms of your blood sugar being too high or too low.  please keep a record of the readings and bring it to your next appointment here (or you can bring the meter itself).  You can write it on any piece of paper.  please call us sooner if your blood sugar goes below 70, or if you have a lot of readings over 200. Please come back for a follow-up appointment in 6 weeks.

## 2020-03-12 NOTE — Progress Notes (Signed)
Subjective:    Patient ID: [name], female    DOB: Oct 07, 1931, 86 y.o.   MRN: [id number]  HPI Pt returns for f/u of diabetes mellitus:  DM type: Insulin-requiring type 2.   Dx'ed: [id number] Complications: CAD, polyneuropathy, and renal insufficiency.  Therapy: insulin since 2007, and Jardiance.   GDM: never.  DKA: never. Severe hypoglycemia: never.   Pancreatitis: never.   SDOH: she takes human insulin, due to cost.   Other: she changed to qd insulin, after poor results with multiple daily injections; she was changed from lantus to QAM NPH, then to 70/30, due to am hypoglycemia and PM hyperglycemia.   Interval history: she brings a record of her cbg's which I have reviewed today.  cbg varies from 64-204, but most are in the mid-100's.  It is lowest when a meal is missed.  Pt also has hyperthyroidism (dx'ed 2012; [place] then showed multinodular goiter; tapazole was chosen as rx, due to low uptake on nuc med scan; tapazole was stopped, due to rash; plan will be for RAI when this recurs off rx).   Past Medical History:  Diagnosis Date  . Anemia    iron defficiency  . Anginal pain ([place])   . Arthritis    "knees, feet, hands; joints" (05/27/2015)  . Asthma   . Atrial fibrillation or flutter    s/p RFCA 7/08;   s/p DCCV in past;   previously on amiodarone;  amio stopped due to lung toxicity  . CAD (coronary artery disease)    s/p NSTEMI tx with BMS to OM1 3/08;  cath 3/08: pOM 99% tx with PCI, pLAD 20%, ? mod stenosis at the AM  . CAP (community acquired pneumonia) 05/24/2015  . Chronic diastolic heart failure (HCC)    echo 11/11:  EF 55-60%, severe LVH, mod LAE, mild MR, mildly increased PASP  . COPD (chronic obstructive pulmonary disease) ([place])   . Degenerative joint disease   . Dystrophy, corneal stromal   . Heart murmur   . HLD (hyperlipidemia)   . HTN (hypertension)    essential nos  . Hypopotassemia    PMH of  . Muscle pain   . Myocardial infarction ([place]) 2008  . OSA on  CPAP   . Osteoporosis   . Pneumonia 05/27/2015  . Protein calorie malnutrition ([place])   . Rash and nonspecific skin eruption    both arms,awaiting bio   dr Delman Cheadle  . Seasonal allergies   . Shortness of breath dyspnea   . Type II diabetes mellitus ([place])    Dr Loanne Drilling    Past Surgical History:  Procedure Laterality Date  . A FLUTTER ABLATION     Dr Lovena Le  . CATARACT EXTRACTION W/ INTRAOCULAR LENS  IMPLANT, BILATERAL Bilateral   . CHOLECYSTECTOMY    . COLONOSCOPY  6/07   2 polyps Dr. Kinnie Feil in [place]  . colonoscopy with polypectomy      X 2; [facility]  . CORNEAL TRANSPLANT Bilateral   . CORONARY ANGIOPLASTY WITH STENT PLACEMENT  02/2007   BMS w/Dr Lovena Le  . EYE SURGERY    . gravid 2 para 2    . KNEE CARTILAGE SURGERY Right   . LEFT AND RIGHT HEART CATHETERIZATION WITH CORONARY ANGIOGRAM N/A 05/07/2014   Procedure: LEFT AND RIGHT HEART CATHETERIZATION WITH CORONARY ANGIOGRAM;  Surgeon: Peter M Martinique, MD;  Location: [facility] CATH LAB;  Service: Cardiovascular;  Laterality: N/A;  . TOTAL KNEE ARTHROPLASTY Right 06/28/2016   Procedure: TOTAL KNEE ARTHROPLASTY;  Surgeon: Frederik Pear, MD;  Location: [facility];  Service: Orthopedics;  Laterality: Right;    Social History   Socioeconomic History  . Marital status: Widowed    Spouse name: Not on file  . Number of children: 2  . Years of education: Not on file  . Highest education level: Not on file  Occupational History  . Occupation: Recruitment consultant Retired  Tobacco Use  . Smoking status: Former Smoker    Packs/day: 0.25    Years: 18.00    Pack years: 4.50    Types: Cigarettes    Quit date: 12/27/1968    Years since quitting: 51.2  . Smokeless tobacco: Never Used  . Tobacco comment: smoked [place], up to 1 pp week  Substance and Sexual Activity  . Alcohol use: Yes    Comment: occ  . Drug use: No  . Sexual activity: Never  Other Topics Concern  . Not on file  Social History Narrative   Teacher. Widowed. Rarely drinks cafeine.    Social  Determinants of Health   Financial Resource Strain:   . Difficulty of Paying Living Expenses:   Food Insecurity:   . Worried About Charity fundraiser in the Last Year:   . Arboriculturist in the Last Year:   Transportation Needs:   . Film/video editor (Medical):   Marland Kitchen Lack of Transportation (Non-Medical):   Physical Activity: Insufficiently Active  . Days of Exercise per Week: 4 days  . Minutes of Exercise per Session: 30 min  Stress: No Stress Concern Present  . Feeling of Stress : Only a little  Social Connections: Slightly Isolated  . Frequency of Communication with Friends and Family: More than three times a week  . Frequency of Social Gatherings with Friends and Family: More than three times a week  . Attends Religious Services: 1 to 4 times per year  . Active Member of Clubs or Organizations: Yes  . Attends Archivist Meetings: 1 to 4 times per year  . Marital Status: Widowed  Intimate Partner Violence:   . Fear of Current or Ex-Partner:   . Emotionally Abused:   Marland Kitchen Physically Abused:   . Sexually Abused:     Current Outpatient Medications on File Prior to Visit  Medication Sig Dispense Refill  . acetaminophen (TYLENOL) 500 MG tablet Take 1,000 mg by mouth every 6 (six) hours as needed (pain).    . CALCIUM-MAG-VIT C-VIT D PO Take 1 tablet by mouth daily.    . carvedilol (COREG) 6.25 MG tablet TAKE 1 TABLET BY MOUTH TWO  TIMES DAILY WITH MEALS 180 tablet 3  . cetirizine (ZYRTEC) 10 MG tablet Take 10 mg by mouth daily. 1 daily    . cholecalciferol (VITAMIN D) 1000 units tablet Take 1,000 Units by mouth daily after supper.    . Cold Sore Products (L-LYSINE EX) Apply topically.    Marland Kitchen ELIQUIS 5 MG TABS tablet TAKE 1 TABLET BY MOUTH  TWICE DAILY 180 tablet 3  . empagliflozin (JARDIANCE) 10 MG TABS tablet Take 10 mg by mouth daily before breakfast. 90 tablet 3  . Evolocumab (REPATHA SURECLICK) [id number] MG/ML SOAJ Inject 1 Dose into the skin every 14 (fourteen) days. 2 pen  11  . ezetimibe (ZETIA) 10 MG tablet Take 1 tablet (10 mg total) by mouth daily. 90 tablet 3  . furosemide (LASIX) 80 MG tablet TAKE 1 TABLET BY MOUTH TWO  TIMES DAILY 180 tablet 3  . glucose blood (ONETOUCH  VERIO) test strip Use to monitor glucose levels 2 times daily 200 each 5  . insulin NPH-regular Human (NOVOLIN 70/30 RELION) (70-30) 100 UNIT/ML injection Inject 30 Units into the skin daily with breakfast. And syringes 1/day 10 mL 11  . Lancets (ONETOUCH ULTRASOFT) lancets Use to monitor glucose levels 2 times daily 200 each 2  . Multiple Vitamin (MULTIVITAMIN WITH MINERALS) TABS tablet Take 1 tablet by mouth daily. Centrum Silver    . nitroGLYCERIN (NITROSTAT) 0.4 MG SL tablet Place 1 tablet (0.4 mg total) under the tongue every 5 (five) minutes as needed for chest pain. Reported on 01/28/2016 30 tablet 3  . potassium chloride SA (K-DUR) 20 MEQ tablet TAKE 2 TABLETS BY MOUTH  DAILY 180 tablet 3  . tiotropium (SPIRIVA HANDIHALER) 18 MCG inhalation capsule Place 1 capsule (18 mcg total) into inhaler and inhale daily. 90 capsule 1   No current facility-administered medications on file prior to visit.    Allergies  Allergen Reactions  . Amlodipine Besylate Other (See Comments)    REACTION: tingling in lips & gum edema  . Levaquin [Levofloxacin] Shortness Of Breath and Swelling    angioedema  . Lobster [Shellfish Allergy] Other (See Comments)    angioedema  . Penicillins Rash    Has patient had a PCN reaction causing immediate rash, facial/tongue/throat swelling, SOB or lightheadedness with hypotension: Yes Has patient had a PCN reaction causing severe rash involving mucus membranes or skin necrosis: No Has patient had a PCN reaction that required hospitalization: No Has patient had a PCN reaction occurring within the last 10 years: No If all of the above answers are "NO", then may proceed with Cephalosporin use.  . Valsartan Other (See Comments)    REACTION: angioedema  . Codeine Other  (See Comments)    Mental status changes  . Lipitor [Atorvastatin] Other (See Comments)    weakness  . Oxycodone Other (See Comments)    hallucinations  . Statins     Made her too weak  . Tramadol   . Crestor [Rosuvastatin Calcium] Rash  . Tramadol Hcl Nausea And Vomiting    Family History  Problem Relation Age of Onset  . Diabetes Mother   . Hypertension Mother   . Transient ischemic attack Mother   . Arthritis Mother   . Heart attack Father 61  . Arthritis Father   . Hypertension Father   . Breast cancer Maternal Aunt   . Arthritis Maternal Aunt     BP 130/70   Pulse 85   Ht 5\' 2"  (1.575 m)   Wt 169 lb 9.6 oz (76.9 kg)   SpO2 91%   BMI 31.02 kg/m    Review of Systems Denies LOC    Objective:   Physical Exam VITAL SIGNS:  See vs page GENERAL: no distress Pulses: dorsalis pedis intact bilat.   MSK: no deformity of the feet CV: no leg edema Skin:  no ulcer on the feet.  normal temp on the feet.  There is erythema and hyperpigmentation of the feet.   Neuro: sensation is intact to touch on the feet Ext: there is bilateral onychomycosis of the toenails.    Lab Results  Component Value Date   HGBA1C 9.2 (H) 02/20/2020   Lab Results  Component Value Date   CREATININE 1.17 02/20/2020   BUN 23 02/20/2020   NA 136 02/20/2020   K 4.2 02/20/2020   CL 98 02/20/2020   CO2 28 02/20/2020       Assessment &  Plan:  Insulin-requiring type 2 DM, with PN. Hypoglycemia: this limits aggressiveness of glycemic control Renal insuff: This limits rx options   Patient Instructions  Please continue the same insulin On this type of insulin schedule, you should eat meals on a regular schedule.  If a meal is missed or significantly delayed, your blood sugar could go low. Please see a specialist, about the circulation.  you will receive a phone call, about a day and time for an appointment. check your blood sugar twice a day.  vary the time of day when you check, between  before the 3 meals, and at bedtime.  also check if you have symptoms of your blood sugar being too high or too low.  please keep a record of the readings and bring it to your next appointment here (or you can bring the meter itself).  You can write it on any piece of paper.  please call us sooner if your blood sugar goes below 70, or if you have a lot of readings over 200. Please come back for a follow-up appointment in 6 weeks.

## 2020-03-13 ENCOUNTER — Ambulatory Visit: Payer: Medicare Other

## 2020-03-13 DIAGNOSIS — G4733 Obstructive sleep apnea (adult) (pediatric): Secondary | ICD-10-CM

## 2020-03-14 ENCOUNTER — Encounter: Payer: Self-pay | Admitting: Endocrinology

## 2020-03-14 DIAGNOSIS — G4733 Obstructive sleep apnea (adult) (pediatric): Secondary | ICD-10-CM | POA: Diagnosis not present

## 2020-03-18 ENCOUNTER — Encounter: Payer: Self-pay | Admitting: Endocrinology

## 2020-03-19 ENCOUNTER — Telehealth: Payer: Self-pay

## 2020-03-19 NOTE — Telephone Encounter (Signed)
FAXED [place] [facility]  Document: CMN and Rx for diabetic shoes and inserts Other records requested: None requested  All above requested information has been faxed successfully to [employer] listed above. Documents and fax confirmation have been placed in the faxed file for future reference.

## 2020-03-21 ENCOUNTER — Encounter: Payer: Self-pay | Admitting: Internal Medicine

## 2020-04-01 ENCOUNTER — Telehealth: Payer: Self-pay | Admitting: Pulmonary Disease

## 2020-04-01 DIAGNOSIS — G4733 Obstructive sleep apnea (adult) (pediatric): Secondary | ICD-10-CM | POA: Diagnosis not present

## 2020-04-01 NOTE — Telephone Encounter (Signed)
HST 03/14/20 >> AHI 15.3, SpO low 79%   Please inform her that her sleep study shows moderate obstructive sleep apnea.  Please arrange for ROV with me or NP to discuss treatment options.

## 2020-04-02 ENCOUNTER — Other Ambulatory Visit: Payer: Self-pay

## 2020-04-02 ENCOUNTER — Encounter: Payer: Self-pay | Admitting: Vascular Surgery

## 2020-04-02 ENCOUNTER — Ambulatory Visit: Payer: Medicare Other | Admitting: Vascular Surgery

## 2020-04-02 VITALS — BP 171/85 | HR 83 | Temp 97.9°F | Resp 18 | Ht 62.0 in | Wt 169.0 lb

## 2020-04-02 DIAGNOSIS — I739 Peripheral vascular disease, unspecified: Secondary | ICD-10-CM

## 2020-04-02 NOTE — H&P (View-Only) (Signed)
REASON FOR CONSULT:    To evaluate for peripheral vascular disease.  The consult is requested by Dr. Renato Shin.  ASSESSMENT & PLAN:   CRITICAL LIMB ISCHEMIA: This patient has rest pain bilaterally and markedly diminished ABIs.  On exam she has evidence of infrainguinal arterial occlusive disease.  I have recommended that we proceed with arteriography in order to see what options we have for revascularization.  If she has a straightforward option for angioplasty and stenting we could potentially proceed at the same time.  If she has a more complicated option or would require a bypass then I think we would only consider this if she developed a nonhealing wound and clearly limb threatening ischemia.  In addition she would need preoperative cardiac evaluation.  She is followed closely by Dr. Haroldine Laws.  I have encouraged her to try to stay as active as possible.  We also discussed the importance of nutrition.  Fortunately she is not a smoker.  I have discussed the indications for arteriography and the potential complications including but not limited to bleeding and arterial injury.  We would need to hold her Eliquis for 48 hours prior to the procedure.  We tentatively have her scheduled for arteriography on 04/11/2020.   CHRONIC KIDNEY DISEASE: Her records suggest that she has some underlying chronic kidney disease.  My most recent labs on 02/20/2020 show a GFR of 53 with a creatinine of 1.17.  If her renal function is any worse we may consider using CO2 with limited contrast.   [name], MD Office: [phone]   HPI:   [name] is a pleasant 86 y.o. female, who was sent for evaluation of peripheral vascular disease.  I reviewed her her office note from the referring office.  The patient was seen on 03/12/2020.  The patient has a history of type 2 diabetes, coronary artery disease, polyneuropathy, and renal insufficiency.  The patient presents today complaining of shooting pain  in both feet which is more significant on the right side.  She does have a history of neuropathy.  I do not get any history of calf or thigh claudication although I think her activity is fairly limited.  She is ambulatory with a cane.  She does describe rest pain in both feet.  This is more significant on the right side.  She denies any history of nonhealing wounds although she recently dropped something on the dorsum of her right foot and has a very small wound there.  Her risk factors for peripheral vascular disease include diabetes, hypertension, and hypercholesterolemia.  She denies any family history of premature cardiovascular disease.  She is not a smoker.  She has atrial fibrillation and is on Eliquis.  She has a history of chronic diastolic congestive heart failure and is followed by Dr. Haroldine Laws.  She had a myocardial infarction approximately 7 years ago according to the patient.  She does tell me that she had a previous left carotid stent but I cannot locate these records.  She says this is followed at the [place] office.  Past Medical History:  Diagnosis Date  . Anemia    iron defficiency  . Anginal pain ([place])   . Arthritis    "knees, feet, hands; joints" (05/27/2015)  . Asthma   . Atrial fibrillation or flutter    s/p RFCA 7/08;   s/p DCCV in past;   previously on amiodarone;  amio stopped due to lung toxicity  . CAD (coronary artery disease)  s/p NSTEMI tx with BMS to OM1 3/08;  cath 3/08: pOM 99% tx with PCI, pLAD 20%, ? mod stenosis at the AM  . CAP (community acquired pneumonia) 05/24/2015  . Chronic diastolic heart failure (HCC)    echo 11/11:  EF 55-60%, severe LVH, mod LAE, mild MR, mildly increased PASP  . COPD (chronic obstructive pulmonary disease) ([place])   . Degenerative joint disease   . Dystrophy, corneal stromal   . Heart murmur   . HLD (hyperlipidemia)   . HTN (hypertension)    essential nos  . Hypopotassemia    PMH of  . Muscle pain   . Myocardial  infarction ([place]) 2008  . OSA on CPAP   . Osteoporosis   . Pneumonia 05/27/2015  . Protein calorie malnutrition ([place])   . Rash and nonspecific skin eruption    both arms,awaiting bio   dr Delman Cheadle  . Seasonal allergies   . Shortness of breath dyspnea   . Type II diabetes mellitus (HCC)    Dr Loanne Drilling    Family History  Problem Relation Age of Onset  . Diabetes Mother   . Hypertension Mother   . Transient ischemic attack Mother   . Arthritis Mother   . Heart attack Father 24  . Arthritis Father   . Hypertension Father   . Breast cancer Maternal Aunt   . Arthritis Maternal Aunt     SOCIAL HISTORY: Social History   Socioeconomic History  . Marital status: Widowed    Spouse name: Not on file  . Number of children: 2  . Years of education: Not on file  . Highest education level: Not on file  Occupational History  . Occupation: Recruitment consultant Retired  Tobacco Use  . Smoking status: Former Smoker    Packs/day: 0.25    Years: 18.00    Pack years: 4.50    Types: Cigarettes    Quit date: 12/27/1968    Years since quitting: 51.2  . Smokeless tobacco: Never Used  . Tobacco comment: smoked [place], up to 1 pp week  Substance and Sexual Activity  . Alcohol use: Yes    Comment: occ  . Drug use: No  . Sexual activity: Never  Other Topics Concern  . Not on file  Social History Narrative   Teacher. Widowed. Rarely drinks cafeine.    Social Determinants of Health   Financial Resource Strain:   . Difficulty of Paying Living Expenses:   Food Insecurity:   . Worried About Charity fundraiser in the Last Year:   . Arboriculturist in the Last Year:   Transportation Needs:   . Film/video editor (Medical):   Marland Kitchen Lack of Transportation (Non-Medical):   Physical Activity: Insufficiently Active  . Days of Exercise per Week: 4 days  . Minutes of Exercise per Session: 30 min  Stress: No Stress Concern Present  . Feeling of Stress : Only a little  Social Connections: Slightly Isolated   . Frequency of Communication with Friends and Family: More than three times a week  . Frequency of Social Gatherings with Friends and Family: More than three times a week  . Attends Religious Services: 1 to 4 times per year  . Active Member of Clubs or Organizations: Yes  . Attends Archivist Meetings: 1 to 4 times per year  . Marital Status: Widowed  Intimate Partner Violence:   . Fear of Current or Ex-Partner:   . Emotionally Abused:   Marland Kitchen Physically  Abused:   . Sexually Abused:     Allergies  Allergen Reactions  . Amlodipine Besylate Other (See Comments)    REACTION: tingling in lips & gum edema  . Levaquin [Levofloxacin] Shortness Of Breath and Swelling    angioedema  . Lobster [Shellfish Allergy] Other (See Comments)    angioedema  . Penicillins Rash    Has patient had a PCN reaction causing immediate rash, facial/tongue/throat swelling, SOB or lightheadedness with hypotension: Yes Has patient had a PCN reaction causing severe rash involving mucus membranes or skin necrosis: No Has patient had a PCN reaction that required hospitalization: No Has patient had a PCN reaction occurring within the last 10 years: No If all of the above answers are "NO", then may proceed with Cephalosporin use.  . Valsartan Other (See Comments)    REACTION: angioedema  . Codeine Other (See Comments)    Mental status changes  . Lipitor [Atorvastatin] Other (See Comments)    weakness  . Oxycodone Other (See Comments)    hallucinations  . Statins     Made her too weak  . Tramadol   . Crestor [Rosuvastatin Calcium] Rash  . Tramadol Hcl Nausea And Vomiting    Current Outpatient Medications  Medication Sig Dispense Refill  . acetaminophen (TYLENOL) 500 MG tablet Take 1,000 mg by mouth every 6 (six) hours as needed (pain).    . CALCIUM-MAG-VIT C-VIT D PO Take 1 tablet by mouth daily.    . carvedilol (COREG) 6.25 MG tablet TAKE 1 TABLET BY MOUTH TWO  TIMES DAILY WITH MEALS 180 tablet 3    . cetirizine (ZYRTEC) 10 MG tablet Take 10 mg by mouth daily. 1 daily    . cholecalciferol (VITAMIN D) 1000 units tablet Take 1,000 Units by mouth daily after supper.    . Cold Sore Products (L-LYSINE EX) Apply topically.    Marland Kitchen ELIQUIS 5 MG TABS tablet TAKE 1 TABLET BY MOUTH  TWICE DAILY 180 tablet 3  . empagliflozin (JARDIANCE) 10 MG TABS tablet Take 10 mg by mouth daily before breakfast. 90 tablet 3  . Evolocumab (REPATHA SURECLICK) XX123456 MG/ML SOAJ Inject 1 Dose into the skin every 14 (fourteen) days. 2 pen 11  . ezetimibe (ZETIA) 10 MG tablet Take 1 tablet (10 mg total) by mouth daily. 90 tablet 3  . furosemide (LASIX) 80 MG tablet TAKE 1 TABLET BY MOUTH TWO  TIMES DAILY 180 tablet 3  . glucose blood (ONETOUCH VERIO) test strip Use to monitor glucose levels 2 times daily 200 each 5  . insulin NPH-regular Human (NOVOLIN 70/30 RELION) (70-30) 100 UNIT/ML injection Inject 30 Units into the skin daily with breakfast. And syringes 1/day 10 mL 11  . Lancets (ONETOUCH ULTRASOFT) lancets Use to monitor glucose levels 2 times daily 200 each 2  . Multiple Vitamin (MULTIVITAMIN WITH MINERALS) TABS tablet Take 1 tablet by mouth daily. Centrum Silver    . nitroGLYCERIN (NITROSTAT) 0.4 MG SL tablet Place 1 tablet (0.4 mg total) under the tongue every 5 (five) minutes as needed for chest pain. Reported on 01/28/2016 30 tablet 3  . potassium chloride SA (K-DUR) 20 MEQ tablet TAKE 2 TABLETS BY MOUTH  DAILY 180 tablet 3  . tiotropium (SPIRIVA HANDIHALER) 18 MCG inhalation capsule Place 1 capsule (18 mcg total) into inhaler and inhale daily. 90 capsule 1   No current facility-administered medications for this visit.    REVIEW OF SYSTEMS:  [X]  denotes positive finding, [ ]  denotes negative finding Cardiac  Comments:  Chest pain or chest pressure:    Shortness of breath upon exertion: x   Short of breath when lying flat:    Irregular heart rhythm:        Vascular    Pain in calf, thigh, or hip brought on by  ambulation: x   Pain in feet at night that wakes you up from your sleep:  x   Blood clot in your veins:    Leg swelling:  x       Pulmonary    Oxygen at home:    Productive cough:     Wheezing:         Neurologic    Sudden weakness in arms or legs:     Sudden numbness in arms or legs:     Sudden onset of difficulty speaking or slurred speech:    Temporary loss of vision in one eye:     Problems with dizziness:         Gastrointestinal    Blood in stool:     Vomited blood:         Genitourinary    Burning when urinating:     Blood in urine:        Psychiatric    Major depression:         Hematologic    Bleeding problems:    Problems with blood clotting too easily:        Skin    Rashes or ulcers: x       Constitutional    Fever or chills:     PHYSICAL EXAM:   Vitals:   04/02/20 1400  BP: (!) 171/85  Pulse: 83  Resp: 18  Temp: 97.9 F (36.6 C)  SpO2: 93%  Weight: 169 lb (76.7 kg)  Height: 5\' 2"  (1.575 m)    GENERAL: The patient is a well-nourished female, in no acute distress. The vital signs are documented above. CARDIAC: There is a regular rate and rhythm.  VASCULAR: I do not detect carotid bruits. She has palpable femoral pulses. I cannot palpate popliteal or pedal pulses. She currently has no significant lower extremity swelling. She does have some chronic ischemic changes to both feet. PULMONARY: There is good air exchange bilaterally without wheezing or rales. ABDOMEN: Soft and non-tender with normal pitched bowel sounds.  MUSCULOSKELETAL: There are no major deformities or cyanosis. NEUROLOGIC: No focal weakness or paresthesias are detected. SKIN: There are no ulcers or rashes noted. PSYCHIATRIC: The patient has a normal affect.  DATA:    ARTERIAL DOPPLER STUDY: I have reviewed her arterial Doppler study that was done on 02/29/2020.  On the right side there is a monophasic posterior tibial and dorsalis pedis signal.  ABI is 42%.  Toe pressure is  11.  On the left side there is a monophasic dorsalis pedis and posterior tibial signal.  ABI is 42%.  Toe pressure is 37.  LABS: I reviewed her labs from 02/20/2020.  Her GFR was 53.  Creatinine 1.17.  Total cholesterol 176 with an LDL cholesterol of 96.  Her white count was 5.5 with a hemoglobin of 17.  Platelets 189,000.

## 2020-04-02 NOTE — Progress Notes (Addendum)
REASON FOR CONSULT:    To evaluate for peripheral vascular disease.  The consult is requested by Dr. Renato Shin.  ASSESSMENT & PLAN:   CRITICAL LIMB ISCHEMIA: This patient has rest pain bilaterally and markedly diminished ABIs.  On exam she has evidence of infrainguinal arterial occlusive disease.  I have recommended that we proceed with arteriography in order to see what options we have for revascularization.  If she has a straightforward option for angioplasty and stenting we could potentially proceed at the same time.  If she has a more complicated option or would require a bypass then I think we would only consider this if she developed a nonhealing wound and clearly limb threatening ischemia.  In addition she would need preoperative cardiac evaluation.  She is followed closely by Dr. Haroldine Laws.  I have encouraged her to try to stay as active as possible.  We also discussed the importance of nutrition.  Fortunately she is not a smoker.  I have discussed the indications for arteriography and the potential complications including but not limited to bleeding and arterial injury.  We would need to hold her Eliquis for 48 hours prior to the procedure.  We tentatively have her scheduled for arteriography on 04/11/2020.   CHRONIC KIDNEY DISEASE: Her records suggest that she has some underlying chronic kidney disease.  My most recent labs on 02/20/2020 show a GFR of 53 with a creatinine of 1.17.  If her renal function is any worse we may consider using CO2 with limited contrast.   [name], MD Office: [phone]   HPI:   [name] is a pleasant 86 y.o. female, who was sent for evaluation of peripheral vascular disease.  I reviewed her her office note from the referring office.  The patient was seen on 03/12/2020.  The patient has a history of type 2 diabetes, coronary artery disease, polyneuropathy, and renal insufficiency.  The patient presents today complaining of shooting pain  in both feet which is more significant on the right side.  She does have a history of neuropathy.  I do not get any history of calf or thigh claudication although I think her activity is fairly limited.  She is ambulatory with a cane.  She does describe rest pain in both feet.  This is more significant on the right side.  She denies any history of nonhealing wounds although she recently dropped something on the dorsum of her right foot and has a very small wound there.  Her risk factors for peripheral vascular disease include diabetes, hypertension, and hypercholesterolemia.  She denies any family history of premature cardiovascular disease.  She is not a smoker.  She has atrial fibrillation and is on Eliquis.  She has a history of chronic diastolic congestive heart failure and is followed by Dr. Haroldine Laws.  She had a myocardial infarction approximately 7 years ago according to the patient.  She does tell me that she had a previous left carotid stent but I cannot locate these records.  She says this is followed at the [place] office.  Past Medical History:  Diagnosis Date  . Anemia    iron defficiency  . Anginal pain ([place])   . Arthritis    "knees, feet, hands; joints" (05/27/2015)  . Asthma   . Atrial fibrillation or flutter    s/p RFCA 7/08;   s/p DCCV in past;   previously on amiodarone;  amio stopped due to lung toxicity  . CAD (coronary artery disease)  s/p NSTEMI tx with BMS to OM1 3/08;  cath 3/08: pOM 99% tx with PCI, pLAD 20%, ? mod stenosis at the AM  . CAP (community acquired pneumonia) 05/24/2015  . Chronic diastolic heart failure (HCC)    echo 11/11:  EF 55-60%, severe LVH, mod LAE, mild MR, mildly increased PASP  . COPD (chronic obstructive pulmonary disease) ([place])   . Degenerative joint disease   . Dystrophy, corneal stromal   . Heart murmur   . HLD (hyperlipidemia)   . HTN (hypertension)    essential nos  . Hypopotassemia    PMH of  . Muscle pain   . Myocardial  infarction ([place]) 2008  . OSA on CPAP   . Osteoporosis   . Pneumonia 05/27/2015  . Protein calorie malnutrition ([place])   . Rash and nonspecific skin eruption    both arms,awaiting bio   dr Delman Cheadle  . Seasonal allergies   . Shortness of breath dyspnea   . Type II diabetes mellitus (HCC)    Dr Loanne Drilling    Family History  Problem Relation Age of Onset  . Diabetes Mother   . Hypertension Mother   . Transient ischemic attack Mother   . Arthritis Mother   . Heart attack Father 32  . Arthritis Father   . Hypertension Father   . Breast cancer Maternal Aunt   . Arthritis Maternal Aunt     SOCIAL HISTORY: Social History   Socioeconomic History  . Marital status: Widowed    Spouse name: Not on file  . Number of children: 2  . Years of education: Not on file  . Highest education level: Not on file  Occupational History  . Occupation: Recruitment consultant Retired  Tobacco Use  . Smoking status: Former Smoker    Packs/day: 0.25    Years: 18.00    Pack years: 4.50    Types: Cigarettes    Quit date: 12/27/1968    Years since quitting: 51.2  . Smokeless tobacco: Never Used  . Tobacco comment: smoked [place], up to 1 pp week  Substance and Sexual Activity  . Alcohol use: Yes    Comment: occ  . Drug use: No  . Sexual activity: Never  Other Topics Concern  . Not on file  Social History Narrative   Teacher. Widowed. Rarely drinks cafeine.    Social Determinants of Health   Financial Resource Strain:   . Difficulty of Paying Living Expenses:   Food Insecurity:   . Worried About Charity fundraiser in the Last Year:   . Arboriculturist in the Last Year:   Transportation Needs:   . Film/video editor (Medical):   Marland Kitchen Lack of Transportation (Non-Medical):   Physical Activity: Insufficiently Active  . Days of Exercise per Week: 4 days  . Minutes of Exercise per Session: 30 min  Stress: No Stress Concern Present  . Feeling of Stress : Only a little  Social Connections: Slightly Isolated   . Frequency of Communication with Friends and Family: More than three times a week  . Frequency of Social Gatherings with Friends and Family: More than three times a week  . Attends Religious Services: 1 to 4 times per year  . Active Member of Clubs or Organizations: Yes  . Attends Archivist Meetings: 1 to 4 times per year  . Marital Status: Widowed  Intimate Partner Violence:   . Fear of Current or Ex-Partner:   . Emotionally Abused:   Marland Kitchen Physically  Abused:   . Sexually Abused:     Allergies  Allergen Reactions  . Amlodipine Besylate Other (See Comments)    REACTION: tingling in lips & gum edema  . Levaquin [Levofloxacin] Shortness Of Breath and Swelling    angioedema  . Lobster [Shellfish Allergy] Other (See Comments)    angioedema  . Penicillins Rash    Has patient had a PCN reaction causing immediate rash, facial/tongue/throat swelling, SOB or lightheadedness with hypotension: Yes Has patient had a PCN reaction causing severe rash involving mucus membranes or skin necrosis: No Has patient had a PCN reaction that required hospitalization: No Has patient had a PCN reaction occurring within the last 10 years: No If all of the above answers are "NO", then may proceed with Cephalosporin use.  . Valsartan Other (See Comments)    REACTION: angioedema  . Codeine Other (See Comments)    Mental status changes  . Lipitor [Atorvastatin] Other (See Comments)    weakness  . Oxycodone Other (See Comments)    hallucinations  . Statins     Made her too weak  . Tramadol   . Crestor [Rosuvastatin Calcium] Rash  . Tramadol Hcl Nausea And Vomiting    Current Outpatient Medications  Medication Sig Dispense Refill  . acetaminophen (TYLENOL) 500 MG tablet Take 1,000 mg by mouth every 6 (six) hours as needed (pain).    . CALCIUM-MAG-VIT C-VIT D PO Take 1 tablet by mouth daily.    . carvedilol (COREG) 6.25 MG tablet TAKE 1 TABLET BY MOUTH TWO  TIMES DAILY WITH MEALS 180 tablet 3    . cetirizine (ZYRTEC) 10 MG tablet Take 10 mg by mouth daily. 1 daily    . cholecalciferol (VITAMIN D) 1000 units tablet Take 1,000 Units by mouth daily after supper.    . Cold Sore Products (L-LYSINE EX) Apply topically.    Marland Kitchen ELIQUIS 5 MG TABS tablet TAKE 1 TABLET BY MOUTH  TWICE DAILY 180 tablet 3  . empagliflozin (JARDIANCE) 10 MG TABS tablet Take 10 mg by mouth daily before breakfast. 90 tablet 3  . Evolocumab (REPATHA SURECLICK) XX123456 MG/ML SOAJ Inject 1 Dose into the skin every 14 (fourteen) days. 2 pen 11  . ezetimibe (ZETIA) 10 MG tablet Take 1 tablet (10 mg total) by mouth daily. 90 tablet 3  . furosemide (LASIX) 80 MG tablet TAKE 1 TABLET BY MOUTH TWO  TIMES DAILY 180 tablet 3  . glucose blood (ONETOUCH VERIO) test strip Use to monitor glucose levels 2 times daily 200 each 5  . insulin NPH-regular Human (NOVOLIN 70/30 RELION) (70-30) 100 UNIT/ML injection Inject 30 Units into the skin daily with breakfast. And syringes 1/day 10 mL 11  . Lancets (ONETOUCH ULTRASOFT) lancets Use to monitor glucose levels 2 times daily 200 each 2  . Multiple Vitamin (MULTIVITAMIN WITH MINERALS) TABS tablet Take 1 tablet by mouth daily. Centrum Silver    . nitroGLYCERIN (NITROSTAT) 0.4 MG SL tablet Place 1 tablet (0.4 mg total) under the tongue every 5 (five) minutes as needed for chest pain. Reported on 01/28/2016 30 tablet 3  . potassium chloride SA (K-DUR) 20 MEQ tablet TAKE 2 TABLETS BY MOUTH  DAILY 180 tablet 3  . tiotropium (SPIRIVA HANDIHALER) 18 MCG inhalation capsule Place 1 capsule (18 mcg total) into inhaler and inhale daily. 90 capsule 1   No current facility-administered medications for this visit.    REVIEW OF SYSTEMS:  [X]  denotes positive finding, [ ]  denotes negative finding Cardiac  Comments:  Chest pain or chest pressure:    Shortness of breath upon exertion: x   Short of breath when lying flat:    Irregular heart rhythm:        Vascular    Pain in calf, thigh, or hip brought on by  ambulation: x   Pain in feet at night that wakes you up from your sleep:  x   Blood clot in your veins:    Leg swelling:  x       Pulmonary    Oxygen at home:    Productive cough:     Wheezing:         Neurologic    Sudden weakness in arms or legs:     Sudden numbness in arms or legs:     Sudden onset of difficulty speaking or slurred speech:    Temporary loss of vision in one eye:     Problems with dizziness:         Gastrointestinal    Blood in stool:     Vomited blood:         Genitourinary    Burning when urinating:     Blood in urine:        Psychiatric    Major depression:         Hematologic    Bleeding problems:    Problems with blood clotting too easily:        Skin    Rashes or ulcers: x       Constitutional    Fever or chills:     PHYSICAL EXAM:   Vitals:   04/02/20 1400  BP: (!) 171/85  Pulse: 83  Resp: 18  Temp: 97.9 F (36.6 C)  SpO2: 93%  Weight: 169 lb (76.7 kg)  Height: 5\' 2"  (1.575 m)    GENERAL: The patient is a well-nourished female, in no acute distress. The vital signs are documented above. CARDIAC: There is a regular rate and rhythm.  VASCULAR: I do not detect carotid bruits. She has palpable femoral pulses. I cannot palpate popliteal or pedal pulses. She currently has no significant lower extremity swelling. She does have some chronic ischemic changes to both feet. PULMONARY: There is good air exchange bilaterally without wheezing or rales. ABDOMEN: Soft and non-tender with normal pitched bowel sounds.  MUSCULOSKELETAL: There are no major deformities or cyanosis. NEUROLOGIC: No focal weakness or paresthesias are detected. SKIN: There are no ulcers or rashes noted. PSYCHIATRIC: The patient has a normal affect.  DATA:    ARTERIAL DOPPLER STUDY: I have reviewed her arterial Doppler study that was done on 02/29/2020.  On the right side there is a monophasic posterior tibial and dorsalis pedis signal.  ABI is 42%.  Toe pressure is  11.  On the left side there is a monophasic dorsalis pedis and posterior tibial signal.  ABI is 42%.  Toe pressure is 37.  LABS: I reviewed her labs from 02/20/2020.  Her GFR was 53.  Creatinine 1.17.  Total cholesterol 176 with an LDL cholesterol of 96.  Her white count was 5.5 with a hemoglobin of 17.  Platelets 189,000.

## 2020-04-02 NOTE — Telephone Encounter (Signed)
Called patient but the call went straight to VM. Left message for patient to call back. Will route to myself for follow up.

## 2020-04-07 ENCOUNTER — Telehealth: Payer: Self-pay | Admitting: Pulmonary Disease

## 2020-04-07 NOTE — Telephone Encounter (Signed)
Chesley Mires, MD  6:02 PM Note   HST 03/14/20 >> AHI 15.3, SpO low 79%   Please inform her that her sleep study shows moderate obstructive sleep apnea.  Please arrange for ROV with me or NP to discuss treatment options.     Pt does have an appt scheduled on 5/28 with PFT prior with Aaron Edelman for a 2 month follow up from last appt with Aaron Edelman 03/04/20.   Attempted to call pt but line rang and rang and then went to a busy tone. Unable to leave a VM. Will try to call back later.

## 2020-04-08 NOTE — Telephone Encounter (Signed)
Called spoke with patient. Let her know her sleep study results. An appointment was scheduled for patient to discuss treatment options. With BPM 04/15/20 nothing further needed at this time.

## 2020-04-09 ENCOUNTER — Other Ambulatory Visit: Payer: Self-pay

## 2020-04-11 ENCOUNTER — Ambulatory Visit (HOSPITAL_COMMUNITY)
Admission: RE | Admit: 2020-04-11 | Discharge: 2020-04-11 | Disposition: A | Discharge status: Home / Self Care | Payer: Medicare Other | Attending: Vascular Surgery | Admitting: Vascular Surgery

## 2020-04-11 ENCOUNTER — Encounter (HOSPITAL_COMMUNITY)
Admission: RE | Disposition: A | Discharge status: Home / Self Care | Payer: Self-pay | Source: Home / Self Care | Attending: Vascular Surgery

## 2020-04-11 ENCOUNTER — Other Ambulatory Visit: Payer: Self-pay

## 2020-04-11 DIAGNOSIS — Z7901 Long term (current) use of anticoagulants: Secondary | ICD-10-CM | POA: Diagnosis not present

## 2020-04-11 DIAGNOSIS — I130 Hypertensive heart and chronic kidney disease with heart failure and stage 1 through stage 4 chronic kidney disease, or unspecified chronic kidney disease: Secondary | ICD-10-CM | POA: Diagnosis not present

## 2020-04-11 DIAGNOSIS — I2510 Atherosclerotic heart disease of native coronary artery without angina pectoris: Secondary | ICD-10-CM | POA: Diagnosis not present

## 2020-04-11 DIAGNOSIS — N189 Chronic kidney disease, unspecified: Secondary | ICD-10-CM | POA: Diagnosis not present

## 2020-04-11 DIAGNOSIS — E1122 Type 2 diabetes mellitus with diabetic chronic kidney disease: Secondary | ICD-10-CM | POA: Insufficient documentation

## 2020-04-11 DIAGNOSIS — I5032 Chronic diastolic (congestive) heart failure: Secondary | ICD-10-CM | POA: Insufficient documentation

## 2020-04-11 DIAGNOSIS — I252 Old myocardial infarction: Secondary | ICD-10-CM | POA: Diagnosis not present

## 2020-04-11 DIAGNOSIS — E785 Hyperlipidemia, unspecified: Secondary | ICD-10-CM | POA: Insufficient documentation

## 2020-04-11 DIAGNOSIS — Z955 Presence of coronary angioplasty implant and graft: Secondary | ICD-10-CM | POA: Diagnosis not present

## 2020-04-11 DIAGNOSIS — J449 Chronic obstructive pulmonary disease, unspecified: Secondary | ICD-10-CM | POA: Insufficient documentation

## 2020-04-11 DIAGNOSIS — Z79899 Other long term (current) drug therapy: Secondary | ICD-10-CM | POA: Insufficient documentation

## 2020-04-11 DIAGNOSIS — I4891 Unspecified atrial fibrillation: Secondary | ICD-10-CM | POA: Diagnosis not present

## 2020-04-11 DIAGNOSIS — G4733 Obstructive sleep apnea (adult) (pediatric): Secondary | ICD-10-CM | POA: Diagnosis not present

## 2020-04-11 DIAGNOSIS — D509 Iron deficiency anemia, unspecified: Secondary | ICD-10-CM | POA: Diagnosis not present

## 2020-04-11 DIAGNOSIS — Z794 Long term (current) use of insulin: Secondary | ICD-10-CM | POA: Diagnosis not present

## 2020-04-11 DIAGNOSIS — I70223 Atherosclerosis of native arteries of extremities with rest pain, bilateral legs: Secondary | ICD-10-CM | POA: Diagnosis not present

## 2020-04-11 DIAGNOSIS — Z87891 Personal history of nicotine dependence: Secondary | ICD-10-CM | POA: Insufficient documentation

## 2020-04-11 HISTORY — PX: PERIPHERAL VASCULAR INTERVENTION: CATH118257

## 2020-04-11 HISTORY — PX: ABDOMINAL AORTOGRAM W/LOWER EXTREMITY: CATH118223

## 2020-04-11 LAB — POCT I-STAT, CHEM 8
BUN: 27 mg/dL — ABNORMAL HIGH (ref 8–23)
BUN: 37 mg/dL — ABNORMAL HIGH (ref 8–23)
Calcium, Ion: 1.09 mmol/L — ABNORMAL LOW (ref 1.15–1.40)
Calcium, Ion: 1.1 mmol/L — ABNORMAL LOW (ref 1.15–1.40)
Chloride: 104 mmol/L (ref 98–111)
Chloride: 106 mmol/L (ref 98–111)
Creatinine, Ser: 1.2 mg/dL — ABNORMAL HIGH (ref 0.44–1.00)
Creatinine, Ser: 1.2 mg/dL — ABNORMAL HIGH (ref 0.44–1.00)
Glucose, Bld: 150 mg/dL — ABNORMAL HIGH (ref 70–99)
Glucose, Bld: 153 mg/dL — ABNORMAL HIGH (ref 70–99)
HCT: 50 % — ABNORMAL HIGH (ref 36.0–46.0)
HCT: 50 % — ABNORMAL HIGH (ref 36.0–46.0)
Hemoglobin: 17 g/dL — ABNORMAL HIGH (ref 12.0–15.0)
Hemoglobin: 17 g/dL — ABNORMAL HIGH (ref 12.0–15.0)
Potassium: 4.4 mmol/L (ref 3.5–5.1)
Potassium: 6.3 mmol/L (ref 3.5–5.1)
Sodium: 139 mmol/L (ref 135–145)
Sodium: 141 mmol/L (ref 135–145)
TCO2: 29 mmol/L (ref 22–32)
TCO2: 34 mmol/L — ABNORMAL HIGH (ref 22–32)

## 2020-04-11 LAB — GLUCOSE, CAPILLARY: Glucose-Capillary: 134 mg/dL — ABNORMAL HIGH (ref 70–99)

## 2020-04-11 SURGERY — ABDOMINAL AORTOGRAM W/LOWER EXTREMITY
Anesthesia: LOCAL | Laterality: Bilateral

## 2020-04-11 MED ORDER — LABETALOL HCL 5 MG/ML IV SOLN: 10.0000 mg | INTRAVENOUS | Status: DC | PRN

## 2020-04-11 MED ORDER — ONDANSETRON HCL 4 MG/2ML IJ SOLN: 4.0000 mg | Freq: Four times a day (QID) | INTRAMUSCULAR | Status: DC | PRN

## 2020-04-11 MED ORDER — LABETALOL HCL 5 MG/ML IV SOLN
INTRAVENOUS | Status: AC
  Filled 2020-04-11: qty 4

## 2020-04-11 MED ORDER — SODIUM CHLORIDE 0.9% FLUSH: 3.0000 mL | INTRAVENOUS | Status: DC | PRN

## 2020-04-11 MED ORDER — ACETAMINOPHEN 325 MG PO TABS: 650.0000 mg | ORAL_TABLET | ORAL | Status: DC | PRN

## 2020-04-11 MED ORDER — HEPARIN SODIUM (PORCINE) 1000 UNIT/ML IJ SOLN
INTRAMUSCULAR | Status: DC | PRN
  Administered 2020-04-11: 7000 [IU] via INTRAVENOUS

## 2020-04-11 MED ORDER — HYDRALAZINE HCL 20 MG/ML IJ SOLN: 5.0000 mg | INTRAMUSCULAR | Status: DC | PRN

## 2020-04-11 MED ORDER — SODIUM CHLORIDE 0.9 % IV SOLN: 250.0000 mL | INTRAVENOUS | Status: DC | PRN

## 2020-04-11 MED ORDER — LIDOCAINE HCL (PF) 1 % IJ SOLN
INTRAMUSCULAR | Status: DC | PRN
  Administered 2020-04-11: 15 mL

## 2020-04-11 MED ORDER — HEPARIN (PORCINE) IN NACL 1000-0.9 UT/500ML-% IV SOLN
INTRAVENOUS | Status: DC | PRN
  Administered 2020-04-11: 500 mL

## 2020-04-11 MED ORDER — SODIUM CHLORIDE 0.9 % WEIGHT BASED INFUSION: 1.0000 mL/kg/h | INTRAVENOUS | Status: DC

## 2020-04-11 MED ORDER — HEPARIN (PORCINE) IN NACL 1000-0.9 UT/500ML-% IV SOLN
INTRAVENOUS | Status: AC
  Filled 2020-04-11: qty 1000

## 2020-04-11 MED ORDER — FENTANYL CITRATE (PF) 100 MCG/2ML IJ SOLN
INTRAMUSCULAR | Status: DC | PRN
  Administered 2020-04-11 (×2): 25 ug via INTRAVENOUS

## 2020-04-11 MED ORDER — SODIUM CHLORIDE 0.9 % IV SOLN: INTRAVENOUS | Status: DC

## 2020-04-11 MED ORDER — HEPARIN SODIUM (PORCINE) 1000 UNIT/ML IJ SOLN
INTRAMUSCULAR | Status: AC
  Filled 2020-04-11: qty 1

## 2020-04-11 MED ORDER — MIDAZOLAM HCL 2 MG/2ML IJ SOLN
INTRAMUSCULAR | Status: DC | PRN
  Administered 2020-04-11 (×2): 0.5 mg via INTRAVENOUS

## 2020-04-11 MED ORDER — IODIXANOL 320 MG/ML IV SOLN
INTRAVENOUS | Status: DC | PRN
  Administered 2020-04-11: 60 mL via INTRA_ARTERIAL

## 2020-04-11 MED ORDER — LIDOCAINE HCL (PF) 1 % IJ SOLN
INTRAMUSCULAR | Status: AC
  Filled 2020-04-11: qty 30

## 2020-04-11 MED ORDER — FENTANYL CITRATE (PF) 100 MCG/2ML IJ SOLN
INTRAMUSCULAR | Status: AC
  Filled 2020-04-11: qty 2

## 2020-04-11 MED ORDER — MIDAZOLAM HCL 2 MG/2ML IJ SOLN
INTRAMUSCULAR | Status: AC
  Filled 2020-04-11: qty 2

## 2020-04-11 MED ORDER — LABETALOL HCL 5 MG/ML IV SOLN
INTRAVENOUS | Status: DC | PRN
  Administered 2020-04-11: 10 mg via INTRAVENOUS

## 2020-04-11 MED ORDER — SODIUM CHLORIDE 0.9% FLUSH: 3.0000 mL | Freq: Two times a day (BID) | INTRAVENOUS | Status: DC

## 2020-04-11 SURGICAL SUPPLY — 14 items
CATH ANGIO 5F PIGTAIL 65CM (CATHETERS) ×3 IMPLANT
CLOSURE MYNX CONTROL 6F/7F (Vascular Products) ×3 IMPLANT
KIT ENCORE 26 ADVANTAGE (KITS) ×3 IMPLANT
KIT MICROPUNCTURE NIT STIFF (SHEATH) ×3 IMPLANT
KIT PV (KITS) ×3 IMPLANT
SHEATH PINNACLE 5F 10CM (SHEATH) ×3 IMPLANT
SHEATH PINNACLE 7F 10CM (SHEATH) ×3 IMPLANT
SHEATH PROBE COVER 6X72 (BAG) ×3 IMPLANT
STENT VIABAHN VBX 6X39X135 (Permanent Stent) ×3 IMPLANT
SYR MEDRAD MARK 7 150ML (SYRINGE) ×3 IMPLANT
TRANSDUCER W/STOPCOCK (MISCELLANEOUS) ×3 IMPLANT
TRAY PV CATH (CUSTOM PROCEDURE TRAY) ×3 IMPLANT
WIRE BENTSON .035X145CM (WIRE) ×3 IMPLANT
WIRE HI TORQ VERSACORE J 260CM (WIRE) ×3 IMPLANT

## 2020-04-11 NOTE — Progress Notes (Signed)
Discharge instructions reviewed with pt and her son (via telephone) both voice understanding.  

## 2020-04-11 NOTE — Op Note (Signed)
PATIENT: [name]      MRN: [id number] DOB: [birth year]    DATE OF PROCEDURE: 04/11/2020  INDICATIONS:    [name] is a 86 y.o. female who presented with rest pain bilaterally and evidence of multilevel arterial occlusive disease.  Given her age I felt that it would be reasonable to proceed with arteriography and consider an endovascular approach if there was something fairly straightforward.  However if she were to require surgery or a more aggressive approach given her age we would only consider this for a nonhealing wound and limb threatening ischemia.  PROCEDURE:    1.  Conscious sedation 2.  Ultrasound-guided access to the right common femoral artery 3.  Aortogram with bilateral iliac arteriogram 4.  Angioplasty and stenting of the right external iliac artery with a 6 mm x 39 mm VBX stent 5.  Retrograde right femoral arteriogram with right lower extremity runoff 6.   SURGEON: [name]. Scot Dock, MD, FACS  ANESTHESIA: Local with sedation  EBL: Minimal  TECHNIQUE: The patient was brought to the peripheral vascular lab and was sedated. The period of conscious sedation was 56 minutes.  During that time period, I was present face-to-face 100% of the time.  The patient was administered half a milligram of Versed and 25 mcg of fentanyl.  She later received an additional half milligram of Versed and 25 mcg of fentanyl.. The patient's heart rate, blood pressure, and oxygen saturation were monitored by the nurse continuously during the procedure.  Both groins were prepped and draped in the usual sterile fashion.  Under ultrasound guidance, after the skin was anesthetized, I cannulated the right common femoral artery with a micropuncture needle and a micropuncture sheath was introduced over a wire.  This was exchanged for a 5 [place] sheath over a Bentson wire.  By ultrasound the femoral artery was patent. A real-time image was obtained and sent to the server.  A pigtail  catheter was positioned at the L1 vertebral body and flush aortogram obtained.  Next the catheter was positioned above the aortic bifurcation and an oblique iliac projection was obtained.  There was a 70% right external iliac artery stenosis I elected to address this with angioplasty and stenting.  The short 5 French sheath was exchanged for a short 7 [place] sheath.  I selected a 6 mm x 39 mm VBX stent which was positioned across the stenosis just below the hypogastric artery and deployed to 14 atm with good apposition.  Retrograde iliac arteriogram showed excellent result with no residual stenosis.  Next right lower extremity runoff film was obtained.  At the completion minx closure was performed of the right femoral artery without complication.  FINDINGS:   1.  There are single renal arteries bilaterally with some mild to moderate diffuse renal artery disease. 2.  The infrarenal aorta is patent.  There is a focal eccentric plaque at the distal aorta on the left just above the left common iliac artery. 3.  On the right side the common iliac artery is patent.  There is severe diffuse disease of the hypogastric artery.  There was a 70% right external iliac artery stenosis which was successfully addressed with balloon angioplasty.  Below that on the right the common femoral and deep femoral artery are patent.  The superficial femoral artery is occluded at its origin with reconstitution of the popliteal artery at the level of the knee.  The below-knee popliteal artery is patent as is the tibial peroneal trunk.  The anterior tibial artery is occluded.  There is severe diffuse disease of the proximal posterior tibial artery which is then patent beyond that.  The peroneal artery is occluded proximally but reconstitutes in the mid leg.   CLINICAL NOTE: By improving the inflow on the right I think this will help with her right leg symptoms.  If she developed critical limb ischemia on the left the only option  would be kissing iliac stents into the aortic bifurcation which would be associated with increased risk given her age.  In addition she has an external iliac artery on the stenosis which could potentially be addressed.  However given her comorbidities and age we will try to avoid an aggressive endovascular approach if at all possible.  I will plan on seeing her back in the office in approximately 3 weeks.   [name] Mayo, MD, FACS [facility]  DATE OF DICTATION:   04/11/2020

## 2020-04-11 NOTE — Progress Notes (Signed)
I stat Potassium 6.3 Rennis Harding, Rn called and informed. I stat re draw complete Potassium 4.4 on re draw Anderson Malta informed

## 2020-04-11 NOTE — Progress Notes (Signed)
Ambulated to the bathroom and in the hallway tol well. No bleeding noted from groin site before or after ambulation

## 2020-04-11 NOTE — Discharge Instructions (Signed)
Femoral Site Care This sheet gives you information about how to care for yourself after your procedure. Your health care provider may also give you more specific instructions. If you have problems or questions, contact your health care provider. What can I expect after the procedure? After the procedure, it is common to have:  Bruising that usually fades within 1-2 weeks.  Tenderness at the site. Follow these instructions at home: Wound care  Follow instructions from your health care provider about how to take care of your insertion site. Make sure you: ? Wash your hands with soap and water before you change your bandage (dressing). If soap and water are not available, use hand sanitizer. ? Change your dressing as told by your health care provider. ? Leave stitches (sutures), skin glue, or adhesive strips in place. These skin closures may need to stay in place for 2 weeks or longer. If adhesive strip edges start to loosen and curl up, you may trim the loose edges. Do not remove adhesive strips completely unless your health care provider tells you to do that.  Do not take baths, swim, or use a hot tub until your health care provider approves.  You may shower 24-48 hours after the procedure or as told by your health care provider. ? Gently wash the site with plain soap and water. ? Pat the area dry with a clean towel. ? Do not rub the site. This may cause bleeding.  Do not apply powder or lotion to the site. Keep the site clean and dry.  Check your femoral site every day for signs of infection. Check for: ? Redness, swelling, or pain. ? Fluid or blood. ? Warmth. ? Pus or a bad smell. Activity  For the first 2-3 days after your procedure, or as long as directed: ? Avoid climbing stairs as much as possible. ? Do not squat.  Do not lift anything that is heavier than 10 lb (4.5 kg), or the limit that you are told, until your health care provider says that it is safe.  Rest as  directed. ? Avoid sitting for a long time without moving. Get up to take short walks every 1-2 hours.  Do not drive for 24 hours if you were given a medicine to help you relax (sedative). General instructions  Take over-the-counter and prescription medicines only as told by your health care provider.  Keep all follow-up visits as told by your health care provider. This is important. Contact a health care provider if you have:  A fever or chills.  You have redness, swelling, or pain around your insertion site. Get help right away if:  The catheter insertion area swells very fast.  You pass out.  You suddenly start to sweat or your skin gets clammy.  The catheter insertion area is bleeding, and the bleeding does not stop when you hold steady pressure on the area.  The area near or just beyond the catheter insertion site becomes pale, cool, tingly, or numb. These symptoms may represent a serious problem that is an emergency. Do not wait to see if the symptoms will go away. Get medical help right away. Call your local emergency services (911 in the [place] Do not drive yourself to the hospital. Summary  After the procedure, it is common to have bruising that usually fades within 1-2 weeks.  Check your femoral site every day for signs of infection.  Do not lift anything that is heavier than 10 lb (4.5 kg), or the   limit that you are told, until your health care provider says that it is safe. This information is not intended to replace advice given to you by your health care provider. Make sure you discuss any questions you have with your health care provider. Document Revised: 12/26/2017 Document Reviewed: 12/26/2017 Elsevier Patient Education  2020 [employer]  

## 2020-04-11 NOTE — Interval H&P Note (Signed)
History and Physical Interval Note:  04/11/2020 10:33 AM  [name]  has presented today for surgery, with the diagnosis of PAD.  The various methods of treatment have been discussed with the patient and family. After consideration of risks, benefits and other options for treatment, the patient has consented to  Procedure(s): ABDOMINAL AORTOGRAM W/LOWER EXTREMITY (Bilateral) as a surgical intervention.  The patient's history has been reviewed, patient examined, no change in status, stable for surgery.  I have reviewed the patient's chart and labs.  Questions were answered to the patient's satisfaction.     [name]

## 2020-04-14 LAB — POCT ACTIVATED CLOTTING TIME: Activated Clotting Time: 290 seconds

## 2020-04-14 NOTE — Progress Notes (Signed)
$'@Patient'h$  ID: [name], female    DOB: 09-25-1931, 86 y.o.   MRN: [id number]  Chief Complaint  Patient presents with  . Follow-up    OSA, HST results     Referring provider: [name], MD  HPI:  86 year old female followed in our office for severe obstructive sleep apnea and COPD  PMH: A. fib, CAD, hypertension, hyperlipidemia, chronic diastolic dysfunction, type 2 diabetes, allergies Smoker/ Smoking History: Former smoker Maintenance:  Spiriva Handihaler 33mg  Pt of: Dr. SHalford Chessman 04/15/2020  - Visit   86year old female former smoker followed in our office for COPD and obstructive sleep apnea.  Since last being seen in our office in March/2021 patient completed a home sleep study that showed moderate obstructive sleep apnea with an AHI of 15.3.  Since last being seen patient has moved into an assisted living facility.  She feels that her breathing is doing better.  She would like to discuss delaying her previously ordered pulmonary function test that scheduled to be completed in May/2021.  We will discuss this today.  Patient is interested in resuming CPAP therapy.  She was previously managed with advanced home care DME.  Patient is currently working with vein and vascular Dr. DScot Dock  Status post vascular procedure earlier this month.  Questionaires / Pulmonary Flowsheets:   MMRC: mMRC Dyspnea Scale mMRC Score  04/15/2020 1  03/04/2020 2   Epworth:  Results of the Epworth flowsheet 03/04/2020  Sitting and reading 1  Watching TV 1  Sitting, inactive in a public place (e.g. a theatre or a meeting) 0  As a passenger in a car for an hour without a break 0  Lying down to rest in the afternoon when circumstances permit 3  Sitting and talking to someone 0  Sitting quietly after a lunch without alcohol 1  In a car, while stopped for a few minutes in traffic 0  Total score 6    Tests:   Pulmonary tests PFT 05/21/14 >> FEV1 1.37 (94%), FEV1% 66, TLC 4.69 (95%),  DLCO 39% CT chest 05/24/15 >> mild centrilobular emphysema CT chest 05/25/17 >> moderate centrilobular emphysema, atherosclerosis  Sleep tests PSG 7/14//15 >> AHI 37 HST 02/21/17 >> AHI 30.5, SaO2 low 78% Auto CPAP 10/23/17 to 11/21/17 >> used on 30 of 30 nights with average 6 hrs 34 min.  Average AHI 1 with median CPAP 8 and 95 th percentile CPAP 11 cm H2O  HST 03/14/20 >> AHI 15.3, SpO low 79%  Cardiac tests Echo 10/31/17 >> EF 65 to 70%, grade 3 DD, mild MR  FENO:  No results found for: NITRICOXIDE  PFT: PFT Results Latest Ref Rng & Units 05/21/2014  FVC-Pre L 2.10  FVC-Predicted Pre % 110  FVC-Post L 2.09  FVC-Predicted Post % 110  Pre FEV1/FVC % % 67  Post FEV1/FCV % % 66  FEV1-Pre L 1.40  FEV1-Predicted Pre % 95  FEV1-Post L 1.37  DLCO UNC% % 39  DLCO COR %Predicted % 54  TLC L 4.69  TLC % Predicted % 95  RV % Predicted % 117    WALK:  SIX MIN WALK 03/04/2020  Supplimental Oxygen during Test? (L/min) No  Tech Comments: Patient was only able to complete 1 lap. Towards the end of the lap, she stated that she was a little tired and SOB but she wanted to complete her lap. She walked at a steady pace with a cane. No O2 needed during or after walk.  Imaging: PERIPHERAL VASCULAR CATHETERIZATION  Result Date: 04/11/2020 PATIENT: [name]      MRN: [id number] DOB: Jun 06, 1931    DATE OF PROCEDURE: 04/11/2020 INDICATIONS:  [name] is a 86 y.o. female who presented with rest pain bilaterally and evidence of multilevel arterial occlusive disease.  Given her age I felt that it would be reasonable to proceed with arteriography and consider an endovascular approach if there was something fairly straightforward.  However if she were to require surgery or a more aggressive approach given her age we would only consider this for a nonhealing wound and limb threatening ischemia. PROCEDURE:  1.  Conscious sedation 2.  Ultrasound-guided access to the right common femoral artery  3.  Aortogram with bilateral iliac arteriogram 4.  Angioplasty and stenting of the right external iliac artery with a 6 mm x 39 mm VBX stent 5.  Retrograde right femoral arteriogram with right lower extremity runoff 6. SURGEON: Judeth Cornfield. Scot Dock, MD, FACS ANESTHESIA: Local with sedation EBL: Minimal TECHNIQUE: The patient was brought to the peripheral vascular lab and was sedated. The period of conscious sedation was 56 minutes.  During that time period, I was present face-to-face 100% of the time.  The patient was administered half a milligram of Versed and 25 mcg of fentanyl.  She later received an additional half milligram of Versed and 25 mcg of fentanyl.. The patient's heart rate, blood pressure, and oxygen saturation were monitored by the nurse continuously during the procedure. Both groins were prepped and draped in the usual sterile fashion.  Under ultrasound guidance, after the skin was anesthetized, I cannulated the right common femoral artery with a micropuncture needle and a micropuncture sheath was introduced over a wire.  This was exchanged for a 5 [place] sheath over a Bentson wire.  By ultrasound the femoral artery was patent. A real-time image was obtained and sent to the server. A pigtail catheter was positioned at the L1 vertebral body and flush aortogram obtained.  Next the catheter was positioned above the aortic bifurcation and an oblique iliac projection was obtained.  There was a 70% right external iliac artery stenosis I elected to address this with angioplasty and stenting. The short 5 French sheath was exchanged for a short 7 [place] sheath.  I selected a 6 mm x 39 mm VBX stent which was positioned across the stenosis just below the hypogastric artery and deployed to 14 atm with good apposition.  Retrograde iliac arteriogram showed excellent result with no residual stenosis.  Next right lower extremity runoff film was obtained.  At the completion minx closure was performed of the right  femoral artery without complication. FINDINGS: 1.  There are single renal arteries bilaterally with some mild to moderate diffuse renal artery disease. 2.  The infrarenal aorta is patent.  There is a focal eccentric plaque at the distal aorta on the left just above the left common iliac artery. 3.  On the right side the common iliac artery is patent.  There is severe diffuse disease of the hypogastric artery.  There was a 70% right external iliac artery stenosis which was successfully addressed with balloon angioplasty.  Below that on the right the common femoral and deep femoral artery are patent.  The superficial femoral artery is occluded at its origin with reconstitution of the popliteal artery at the level of the knee.  The below-knee popliteal artery is patent as is the tibial peroneal trunk.  The anterior tibial artery is occluded.  There  is severe diffuse disease of the proximal posterior tibial artery which is then patent beyond that.  The peroneal artery is occluded proximally but reconstitutes in the mid leg. CLINICAL NOTE: By improving the inflow on the right I think this will help with her right leg symptoms.  If she developed critical limb ischemia on the left the only option would be kissing iliac stents into the aortic bifurcation which would be associated with increased risk given her age.  In addition she has an external iliac artery on the stenosis which could potentially be addressed.  However given her comorbidities and age we will try to avoid an aggressive endovascular approach if at all possible.  I will plan on seeing her back in the office in approximately 3 weeks. [name], MD, FACS [facility] DATE OF DICTATION:   04/11/2020    Lab Results:  CBC    Component Value Date/Time   WBC 5.3 02/20/2020 1239   RBC 5.51 (H) 02/20/2020 1239   HGB 17.0 (H) 04/11/2020 1013   HCT 50.0 (H) 04/11/2020 1013   PLT 189.0 02/20/2020 1239   MCV 95.6  02/20/2020 1239   MCH 30.4 01/09/2020 1215   MCHC 32.6 02/20/2020 1239   RDW 13.7 02/20/2020 1239   LYMPHSABS 1.2 02/20/2020 1239   MONOABS 0.6 02/20/2020 1239   EOSABS 0.2 02/20/2020 1239   BASOSABS 0.1 02/20/2020 1239    BMET    Component Value Date/Time   NA 141 04/11/2020 1013   NA 138 07/02/2016 0000   NA 138 07/02/2016 0000   K 4.4 04/11/2020 1013   CL 106 04/11/2020 1013   CO2 28 02/20/2020 1239   GLUCOSE 150 (H) 04/11/2020 1013   BUN 27 (H) 04/11/2020 1013   BUN 25 (A) 07/02/2016 0000   BUN 25 (A) 07/02/2016 0000   CREATININE 1.20 (H) 04/11/2020 1013   CALCIUM 10.5 02/20/2020 1239   GFRNONAA 46 (L) 01/09/2020 1215   GFRAA 54 (L) 01/09/2020 1215    BNP    Component Value Date/Time   BNP 490.1 (H) 02/23/2018 1229    ProBNP    Component Value Date/Time   PROBNP 453.0 (H) 01/30/2018 1236    Specialty Problems      Pulmonary Problems   SOB (shortness of breath)   Nocturnal hypoxemia   OSA     NPSG 07/2014:  AHI 37/hr.       COPD GOLD I     - quit smoking 1970 - PFT's  05/21/14  FEV1 1.37 (94 % ) ratio 66  p no % improvement from saba p ? prior to study with DLCO  39 % corrects to 54  % for alv volume  With classic curvature  -06/02/2017  After extensive coaching HFA effectiveness =    50%  So rx symbicort 80 mg 2bid> d/c 07/06/17 and no worse off it by 10/10/2017 so pulmonary f/u prn          Allergies  Allergen Reactions  . Amlodipine Besylate Other (See Comments)    REACTION: tingling in lips & gum edema  . Levaquin [Levofloxacin] Shortness Of Breath and Swelling    angioedema  . Lobster [Shellfish Allergy] Other (See Comments)    angioedema  . Penicillins Rash    Has patient had a PCN reaction causing immediate rash, facial/tongue/throat swelling, SOB or lightheadedness with hypotension: Yes Has patient had a PCN reaction causing severe rash involving mucus membranes or skin necrosis: No Has patient  had a PCN reaction that required  hospitalization: No Has patient had a PCN reaction occurring within the last 10 years: No If all of the above answers are "NO", then may proceed with Cephalosporin use.  . Valsartan Other (See Comments)    REACTION: angioedema  . Codeine Other (See Comments)    Mental status changes  . Lipitor [Atorvastatin] Other (See Comments)    weakness  . Oxycodone Other (See Comments)    hallucinations  . Statins     Made her too weak  . Tramadol   . Crestor [Rosuvastatin Calcium] Rash  . Tramadol Hcl Nausea And Vomiting    Immunization History  Administered Date(s) Administered  . Fluad Quad(high Dose 65+) 08/22/2019  . Influenza Split 11/16/2011, 09/26/2012  . Influenza Whole 12/27/2004, 10/21/2008, 10/20/2009, 11/11/2010  . Influenza, High Dose Seasonal PF 10/14/2016, 10/10/2017, 09/28/2018  . Influenza,inj,Quad PF,6+ Mos 09/24/2013, 12/16/2014, 09/19/2015  . Moderna SARS-COVID-2 Vaccination 02/08/2020  . PPD Test 05/31/2015, 07/07/2016  . Pneumococcal Conjugate-13 07/31/2018  . Pneumococcal Polysaccharide-23 12/16/2014  . Pneumococcal-Unspecified 05/31/2015  . Td 06/15/2010    Past Medical History:  Diagnosis Date  . Anemia    iron defficiency  . Anginal pain ([place])   . Arthritis    "knees, feet, hands; joints" (05/27/2015)  . Asthma   . Atrial fibrillation or flutter    s/p RFCA 7/08;   s/p DCCV in past;   previously on amiodarone;  amio stopped due to lung toxicity  . CAD (coronary artery disease)    s/p NSTEMI tx with BMS to OM1 3/08;  cath 3/08: pOM 99% tx with PCI, pLAD 20%, ? mod stenosis at the AM  . CAP (community acquired pneumonia) 05/24/2015  . Chronic diastolic heart failure (HCC)    echo 11/11:  EF 55-60%, severe LVH, mod LAE, mild MR, mildly increased PASP  . COPD (chronic obstructive pulmonary disease) ([place])   . Degenerative joint disease   . Dystrophy, corneal stromal   . Heart murmur   . HLD (hyperlipidemia)   . HTN (hypertension)    essential nos  .  Hypopotassemia    PMH of  . Muscle pain   . Myocardial infarction ([place]) 2008  . OSA on CPAP   . Osteoporosis   . Pneumonia 05/27/2015  . Protein calorie malnutrition ([place])   . Rash and nonspecific skin eruption    both arms,awaiting bio   dr Delman Cheadle  . Seasonal allergies   . Shortness of breath dyspnea   . Type II diabetes mellitus ([place])    Dr Loanne Drilling    Tobacco History: Social History   Tobacco Use  Smoking Status Former Smoker  . Packs/day: 0.25  . Years: 18.00  . Pack years: 4.50  . Types: Cigarettes  . Quit date: 12/27/1968  . Years since quitting: 51.3  Smokeless Tobacco Never Used  Tobacco Comment   smoked [place], up to 1 pp week   Counseling given: Yes Comment: smoked [place], up to 1 pp week   Continue to not smoke  Outpatient Encounter Medications as of 04/15/2020  Medication Sig  . acetaminophen (TYLENOL) 500 MG tablet Take 1,000 mg by mouth every 6 (six) hours as needed (pain).  . CALCIUM-MAG-VIT C-VIT D PO Take 1 tablet by mouth daily.  . carvedilol (COREG) 6.25 MG tablet TAKE 1 TABLET BY MOUTH TWO  TIMES DAILY WITH MEALS  . cholecalciferol (VITAMIN D) 1000 units tablet Take 1,000 Units by mouth daily after supper.  Abbe [place]  Sore Products (L-LYSINE EX) Apply topically.  Marland Kitchen ELIQUIS 5 MG TABS tablet TAKE 1 TABLET BY MOUTH  TWICE DAILY  . empagliflozin (JARDIANCE) 10 MG TABS tablet Take 10 mg by mouth daily before breakfast.  . Evolocumab (REPATHA SURECLICK) 716 MG/ML SOAJ Inject 1 Dose into the skin every 14 (fourteen) days.  Marland Kitchen ezetimibe (ZETIA) 10 MG tablet Take 1 tablet (10 mg total) by mouth daily.  . furosemide (LASIX) 80 MG tablet TAKE 1 TABLET BY MOUTH TWO  TIMES DAILY  . glucose blood (ONETOUCH VERIO) test strip Use to monitor glucose levels 2 times daily  . insulin NPH-regular Human (NOVOLIN 70/30 RELION) (70-30) 100 UNIT/ML injection Inject 30 Units into the skin daily with breakfast. And syringes 1/day  . Lancets (ONETOUCH ULTRASOFT) lancets Use to  monitor glucose levels 2 times daily  . Multiple Vitamin (MULTIVITAMIN WITH MINERALS) TABS tablet Take 1 tablet by mouth daily. Centrum Silver  . nitroGLYCERIN (NITROSTAT) 0.4 MG SL tablet Place 1 tablet (0.4 mg total) under the tongue every 5 (five) minutes as needed for chest pain. Reported on 01/28/2016  . potassium chloride SA (K-DUR) 20 MEQ tablet TAKE 2 TABLETS BY MOUTH  DAILY  . tiotropium (SPIRIVA HANDIHALER) 18 MCG inhalation capsule Place 1 capsule (18 mcg total) into inhaler and inhale daily.  . [DISCONTINUED] cetirizine (ZYRTEC) 10 MG tablet Take 10 mg by mouth daily. 1 daily   No facility-administered encounter medications on file as of 04/15/2020.     Review of Systems  Review of Systems  Constitutional: Negative for activity change, fatigue and fever.  HENT: Negative for sinus pressure, sinus pain and sore throat.   Respiratory: Negative for cough, shortness of breath and wheezing.   Cardiovascular: Negative for chest pain and palpitations.  Gastrointestinal: Negative for diarrhea, nausea and vomiting.  Musculoskeletal: Positive for gait problem. Negative for arthralgias.  Neurological: Negative for dizziness.  Psychiatric/Behavioral: Negative for sleep disturbance. The patient is not nervous/anxious.      Physical Exam  BP 120/72 (BP Location: Left Arm, Patient Position: Sitting, Cuff Size: Normal)   Pulse 88   Temp 97.6 F (36.4 C) (Temporal)   Ht _0  (1.6 m)   Wt 165 lb 9.6 oz (75.1 kg)   SpO2 94% Comment: ON ra  BMI 29.33 kg/m   Wt Readings from Last 5 Encounters:  04/15/20 165 lb 9.6 oz (75.1 kg)  04/11/20 165 lb (74.8 kg)  04/02/20 169 lb (76.7 kg)  03/12/20 169 lb 9.6 oz (76.9 kg)  03/04/20 170 lb (77.1 kg)    BMI Readings from Last 5 Encounters:  04/15/20 29.33 kg/m  04/11/20 29.23 kg/m  04/02/20 30.91 kg/m  03/12/20 31.02 kg/m  03/04/20 31.09 kg/m     Physical Exam Vitals and nursing note reviewed.  Constitutional:      General: She  is not in acute distress.    Appearance: Normal appearance. She is normal weight.  HENT:     Head: Normocephalic and atraumatic.     Right Ear: External ear normal.     Left Ear: External ear normal.     Nose:     Comments: Deferred due to masking requirement    Mouth/Throat:     Comments: Deferred due to masking requirement Cardiovascular:     Rate and Rhythm: Normal rate and regular rhythm.     Pulses: Normal pulses.     Heart sounds: Normal heart sounds. No murmur.  Pulmonary:     Effort: Pulmonary effort is normal.  No respiratory distress.     Breath sounds: Normal breath sounds. No decreased air movement. No decreased breath sounds, wheezing or rales.  Musculoskeletal:     Cervical back: Normal range of motion.  Skin:    General: Skin is warm and dry.     Capillary Refill: Capillary refill takes less than 2 seconds.  Neurological:     General: No focal deficit present.     Mental Status: She is alert and oriented to person, place, and time. Mental status is at baseline.     Gait: Gait abnormal (Walks with cane).  Psychiatric:        Mood and Affect: Mood normal.        Behavior: Behavior normal.        Thought Content: Thought content normal.        Judgment: Judgment normal.       Assessment & Plan:   COPD GOLD I  Patient reporting that she feels that her breathing is stable if not improved She is currently overwhelmed with the amount of scheduled appointments due to her recent admission for her vascular stent Patient would prefer to focus on this at this point in time She is okay remaining on Spiriva HandiHaler She would like to delay obtaining the pulmonary function test by few months  Plan: Okay to cancel upcoming PFT appointment Can reevaluate in 2 months when we see the patient back Continue Spiriva HandiHaler at this time  OSA  History of severe obstructive sleep apnea Previously intolerant to CPAP, used full facemask May be good candidate for nasal  mask Epworth score 6 Most recent home sleep study showing moderate obstructive sleep apnea.  Plan: New CPAP start Follow-up with our office in 2 months    Return in about 2 months (around 06/15/2020), or if symptoms worsen or fail to improve, for Follow up with Dr. Halford Chessman.   Lauraine Rinne, NP 04/15/2020   This appointment required 32 minutes of patient care (this includes precharting, chart review, review of results, face-to-face care, etc.).

## 2020-04-15 ENCOUNTER — Encounter (HOSPITAL_COMMUNITY): Payer: Self-pay | Admitting: Vascular Surgery

## 2020-04-15 ENCOUNTER — Encounter: Payer: Self-pay | Admitting: Pulmonary Disease

## 2020-04-15 ENCOUNTER — Other Ambulatory Visit: Payer: Self-pay

## 2020-04-15 ENCOUNTER — Ambulatory Visit: Payer: Medicare Other | Admitting: Pulmonary Disease

## 2020-04-15 VITALS — BP 120/72 | HR 88 | Temp 97.6°F | Ht 63.0 in | Wt 165.6 lb

## 2020-04-15 DIAGNOSIS — Z9989 Dependence on other enabling machines and devices: Secondary | ICD-10-CM

## 2020-04-15 DIAGNOSIS — J449 Chronic obstructive pulmonary disease, unspecified: Secondary | ICD-10-CM

## 2020-04-15 DIAGNOSIS — G4733 Obstructive sleep apnea (adult) (pediatric): Secondary | ICD-10-CM | POA: Diagnosis not present

## 2020-04-15 NOTE — Assessment & Plan Note (Signed)
History of severe obstructive sleep apnea Previously intolerant to CPAP, used full facemask May be good candidate for nasal mask Epworth score 6 Most recent home sleep study showing moderate obstructive sleep apnea.  Plan: New CPAP start Follow-up with our office in 2 months

## 2020-04-15 NOTE — Progress Notes (Signed)
Reviewed and agree with assessment/plan.   Gennie Dib, MD [facility] 12/22/2016, 12:24 PM Pager:  [phone]  

## 2020-04-15 NOTE — Assessment & Plan Note (Signed)
Patient reporting that she feels that her breathing is stable if not improved She is currently overwhelmed with the amount of scheduled appointments due to her recent admission for her vascular stent Patient would prefer to focus on this at this point in time She is okay remaining on Spiriva HandiHaler She would like to delay obtaining the pulmonary function test by few months  Plan: Okay to cancel upcoming PFT appointment Can reevaluate in 2 months when we see the patient back Continue Spiriva HandiHaler at this time

## 2020-04-15 NOTE — Patient Instructions (Addendum)
You were seen today by Lauraine Rinne, NP  for:   1. OSA   New CPAP start DME: [employer] APAP setting 5-15 Mask of choice  We recommend that you start using your CPAP daily >>>Keep up the hard work using your device >>> Goal should be wearing this for the entire night that you are sleeping, at least 4 to 6 hours  Remember:  . Do not drive or operate heavy machinery if tired or drowsy.  . Please notify the supply company and office if you are unable to use your device regularly due to missing supplies or machine being broken.  . Work on maintaining a healthy weight and following your recommended nutrition plan  . Maintain proper daily exercise and movement  . Maintaining proper use of your device can also help improve management of other chronic illnesses such as: Blood pressure, blood sugars, and weight management.   BiPAP/ CPAP Cleaning:  >>>Clean weekly, with Dawn soap, and bottle brush.  Set up to air dry. >>> Wipe mask out daily with wet wipe or towelette   2. COPD GOLD I   Spiriva Handihaler 66mcg capsule >>> take 2 puffs using the inhaler and 1 capsule daily  >>> rinse mouth out after use  >>> take daily no matter what  >>> this is not a rescue inhaler   Note your daily symptoms > remember "red flags" for COPD:   >>>Increase in cough >>>increase in sputum production >>>increase in shortness of breath or activity  intolerance.   If you notice these symptoms, please call the office to be seen.   We will cancel your PFT as discussed today.  If your breathing changes please contact our office.  We can get this rescheduled.   Follow Up:    Return in about 2 months (around 06/15/2020), or if symptoms worsen or fail to improve, for Follow up with Dr. Halford Chessman.  Okay to cancel upcoming scheduled PFT.  Please do your part to reduce the spread of COVID-19:      Reduce your risk of any infection  and COVID19 by using the similar precautions used for avoiding the common cold or  flu:  Marland Kitchen Wash your hands often with soap and warm water for at least 20 seconds.  If soap and water are not readily available, use an alcohol-based hand sanitizer with at least 60% alcohol.  . If coughing or sneezing, cover your mouth and nose by coughing or sneezing into the elbow areas of your shirt or coat, into a tissue or into your sleeve (not your hands). Langley Gauss A MASK when in public  . Avoid shaking hands with others and consider head nods or verbal greetings only. . Avoid touching your eyes, nose, or mouth with unwashed hands.  . Avoid close contact with people who are sick. . Avoid places or events with large numbers of people in one location, like concerts or sporting events. . If you have some symptoms but not all symptoms, continue to monitor at home and seek medical attention if your symptoms worsen. . If you are having a medical emergency, call 911.   [place] / e-Visit: [email or web address]         [facility]: [facility]: [id number]                   [facility]: [id number]     It is flu season:   >>>  Best ways to protect herself from the flu: Receive the yearly flu vaccine, practice good hand hygiene washing with soap and also using hand sanitizer when available, eat a nutritious meals, get adequate rest, hydrate appropriately   Please contact the office if your symptoms worsen or you have concerns that you are not improving.   Thank you for choosing [facility] for your healthcare, and for allowing [place] to partner with you on your healthcare journey. I am thankful to be able to provide care to you today.   Wyn Quaker FNP-C

## 2020-04-16 ENCOUNTER — Telehealth (HOSPITAL_COMMUNITY): Payer: Self-pay | Admitting: Pharmacist

## 2020-04-16 NOTE — Telephone Encounter (Signed)
Received message that patient is now in the donut hole and cannot afford her Eliquis. I reached ou to patient to start manufacturer's assistance paperwork. She will come in this week to sign application, provide OOP expense report and pick up samples of Eliquis. I will fax application to [employer] once completed.   Audry Riles, PharmD, BCPS, BCCP, CPP [facility] Pharmacist [phone]

## 2020-04-17 NOTE — Telephone Encounter (Signed)
Sent in Manufacturer's Assistance application to [employer] for Eliquis.    Application pending, will continue to follow.  Gittel Mccamish, PharmD, BCPS, BCCP, CPP [facility] Pharmacist [phone]   

## 2020-04-18 NOTE — Telephone Encounter (Signed)
Advanced Heart Failure Patient Advocate Encounter   Patient was denied to receive Eliquis from [employer]. I spoke with representative and they informed me that she needs to spend $643.08 on prescription drugs for calendar year 2021 before she can be approved. Patient will need to submit an out of pocket expense report showing she has met this criteria. Left patient VM to call me back.  Lauren Kemp, PharmD, BCPS, BCCP, CPP [facility] Pharmacist [phone]  

## 2020-04-22 DIAGNOSIS — E119 Type 2 diabetes mellitus without complications: Secondary | ICD-10-CM | POA: Diagnosis not present

## 2020-04-22 NOTE — Telephone Encounter (Signed)
Spoke with patient about the need to spend $643.08 in 2021 on prescription drugs before she can be approved for BMS patient assistance for Eliquis. She informed me that she has spent $447 in 2021 at this time. Currently, her Eliquis is $124.74/month. She will pay for Eliquis out of pocket for the next 2 months. After that time, she will get the OOP expense report from her Pharmacy and I will fax it into BMS.  Audry Riles, PharmD, BCPS, BCCP, CPP [facility] Pharmacist [phone]

## 2020-04-23 ENCOUNTER — Ambulatory Visit: Payer: Medicare Other | Admitting: Endocrinology

## 2020-04-30 ENCOUNTER — Other Ambulatory Visit: Payer: Self-pay

## 2020-04-30 ENCOUNTER — Ambulatory Visit (INDEPENDENT_AMBULATORY_CARE_PROVIDER_SITE_OTHER): Payer: Medicare Other | Admitting: Vascular Surgery

## 2020-04-30 ENCOUNTER — Encounter: Payer: Self-pay | Admitting: Vascular Surgery

## 2020-04-30 VITALS — BP 163/85 | HR 86 | Temp 98.1°F | Resp 20 | Ht 63.0 in | Wt 171.0 lb

## 2020-04-30 DIAGNOSIS — I739 Peripheral vascular disease, unspecified: Secondary | ICD-10-CM

## 2020-04-30 NOTE — Progress Notes (Signed)
Patient name: [name] MRN: [id number] DOB: 1931/01/05 Sex: female  REASON FOR VISIT:   Follow-up after right external iliac artery angioplasty and stenting  HPI:   [name] is a pleasant 86 y.o. female who presented with rest pain of both lower extremities and evidence of multilevel arterial occlusive disease.  I felt that it would be reasonable to proceed with arteriography to see if she had an endovascular approach, something that would be fairly straightforward, however if she required surgery or a more aggressive approach given her age we would only consider this for a nonhealing wound or limb threatening ischemia.  On 04/12/2019 she underwent angioplasty and stenting of the right external iliac artery with a 6 x 39 VBX stent.  Her main complaint is swelling in both legs which I suspect is related to her keeping her feet hanging down because of rest pain.  However she states that she is able to elevate her legs some without significant pain in her feet.  She has mild claudication.  She has no history of nonhealing wounds.  She does have a history of congestive heart failure and is followed by Dr. Haroldine Laws.  Current Outpatient Medications  Medication Sig Dispense Refill  . acetaminophen (TYLENOL) 500 MG tablet Take 1,000 mg by mouth every 6 (six) hours as needed (pain).    . CALCIUM-MAG-VIT C-VIT D PO Take 1 tablet by mouth daily.    . carvedilol (COREG) 6.25 MG tablet TAKE 1 TABLET BY MOUTH TWO  TIMES DAILY WITH MEALS 180 tablet 3  . cholecalciferol (VITAMIN D) 1000 units tablet Take 1,000 Units by mouth daily after supper.    . Cold Sore Products (L-LYSINE EX) Apply topically.    Marland Kitchen ELIQUIS 5 MG TABS tablet TAKE 1 TABLET BY MOUTH  TWICE DAILY 180 tablet 3  . empagliflozin (JARDIANCE) 10 MG TABS tablet Take 10 mg by mouth daily before breakfast. 90 tablet 3  . Evolocumab (REPATHA SURECLICK) XX123456 MG/ML SOAJ Inject 1 Dose into the skin every 14 (fourteen) days. 2 pen 11  .  furosemide (LASIX) 80 MG tablet TAKE 1 TABLET BY MOUTH TWO  TIMES DAILY 180 tablet 3  . glucose blood (ONETOUCH VERIO) test strip Use to monitor glucose levels 2 times daily 200 each 5  . insulin NPH-regular Human (NOVOLIN 70/30 RELION) (70-30) 100 UNIT/ML injection Inject 30 Units into the skin daily with breakfast. And syringes 1/day 10 mL 11  . Lancets (ONETOUCH ULTRASOFT) lancets Use to monitor glucose levels 2 times daily 200 each 2  . Multiple Vitamin (MULTIVITAMIN WITH MINERALS) TABS tablet Take 1 tablet by mouth daily. Centrum Silver    . nitroGLYCERIN (NITROSTAT) 0.4 MG SL tablet Place 1 tablet (0.4 mg total) under the tongue every 5 (five) minutes as needed for chest pain. Reported on 01/28/2016 30 tablet 3  . potassium chloride SA (K-DUR) 20 MEQ tablet TAKE 2 TABLETS BY MOUTH  DAILY 180 tablet 3  . tiotropium (SPIRIVA HANDIHALER) 18 MCG inhalation capsule Place 1 capsule (18 mcg total) into inhaler and inhale daily. 90 capsule 1  . ezetimibe (ZETIA) 10 MG tablet Take 1 tablet (10 mg total) by mouth daily. 90 tablet 3   No current facility-administered medications for this visit.    REVIEW OF SYSTEMS:  [X]  denotes positive finding, [ ]  denotes negative finding Vascular    Leg swelling x   Cardiac    Chest pain or chest pressure:    Shortness of breath upon exertion:  Short of breath when lying flat:    Irregular heart rhythm:    Constitutional    Fever or chills:     PHYSICAL EXAM:   Vitals:   04/30/20 0844  BP: (!) 163/85  Pulse: 86  Resp: 20  Temp: 98.1 F (36.7 C)  SpO2: 94%  Weight: 171 lb (77.6 kg)  Height: 5\' 3"  (1.6 m)    GENERAL: The patient is a well-nourished female, in no acute distress. The vital signs are documented above. CARDIOVASCULAR: There is a regular rate and rhythm. PULMONARY: There is good air exchange bilaterally without wheezing or rales. VASCULAR: She has palpable femoral pulses. Both feet are warm and well perfused. Her right groin looks  fine without hematoma.  DATA:   ARTERIOGRAM: I again reviewed her arteriogram with the patient and her daughter.  She has a superficial femoral artery occlusion on the right and also tibial artery occlusive disease.  We addressed her proximal right external iliac artery stenosis and she has a palpable femoral pulse.  She has an eccentric calcific plaque in the distal aorta extending slightly into the common iliac artery.  I think addressing this would be associated with significant risk given that it is a calcified eccentric plaque.  MEDICAL ISSUES:   MULTILEVEL ARTERIAL OCCLUSIVE DISEASE: Patient is undergone successful angioplasty and stenting of the right external iliac artery.  She has multilevel arterial occlusive disease on the right with a superficial femoral artery occlusion and also tibial artery occlusive disease.  Given her age I do not think she is a good candidate for revascularization unless she developed critical limb ischemia with a nonhealing wound.  She has stage III chronic kidney disease and we limited her contrast we did not study the left leg.  However she does have an eccentric plaque in her distal aorta and if her symptoms progressed the next option would be to consider kissing balloon angioplasty to address the stenosis.  However this would be associated with significant risk given that this is a calcific eccentric plaque.  I plan on seeing her back in 3 months with follow up ABIs and a duplex of the aorta and iliacs.  She knows to call sooner if she has problems.  Of note we have discussed the importance of walking and nutrition.  Fortunately she is not a smoker.  [name] [facility] [phone]

## 2020-05-01 DIAGNOSIS — R2689 Other abnormalities of gait and mobility: Secondary | ICD-10-CM | POA: Diagnosis not present

## 2020-05-01 DIAGNOSIS — M6281 Muscle weakness (generalized): Secondary | ICD-10-CM | POA: Diagnosis not present

## 2020-05-02 ENCOUNTER — Other Ambulatory Visit: Payer: Self-pay | Admitting: Endocrinology

## 2020-05-02 ENCOUNTER — Other Ambulatory Visit: Payer: Self-pay | Admitting: *Deleted

## 2020-05-02 ENCOUNTER — Other Ambulatory Visit: Payer: Self-pay

## 2020-05-02 ENCOUNTER — Other Ambulatory Visit (HOSPITAL_COMMUNITY): Payer: Self-pay | Admitting: Internal Medicine

## 2020-05-02 DIAGNOSIS — I739 Peripheral vascular disease, unspecified: Secondary | ICD-10-CM

## 2020-05-02 DIAGNOSIS — N183 Chronic kidney disease, stage 3 unspecified: Secondary | ICD-10-CM

## 2020-05-02 DIAGNOSIS — Z794 Long term (current) use of insulin: Secondary | ICD-10-CM

## 2020-05-05 ENCOUNTER — Telehealth: Payer: Self-pay | Admitting: Pulmonary Disease

## 2020-05-05 NOTE — Telephone Encounter (Signed)
Pt aware that order has been sent to [facility] and they have received it

## 2020-05-05 NOTE — Telephone Encounter (Signed)
Order sent to [facility].

## 2020-05-06 ENCOUNTER — Encounter: Payer: Self-pay | Admitting: Endocrinology

## 2020-05-06 ENCOUNTER — Other Ambulatory Visit: Payer: Self-pay

## 2020-05-06 ENCOUNTER — Ambulatory Visit: Payer: Medicare Other | Admitting: Endocrinology

## 2020-05-06 VITALS — BP 140/78 | HR 95 | Ht 63.0 in | Wt 166.0 lb

## 2020-05-06 DIAGNOSIS — N183 Chronic kidney disease, stage 3 unspecified: Secondary | ICD-10-CM | POA: Diagnosis not present

## 2020-05-06 DIAGNOSIS — E1122 Type 2 diabetes mellitus with diabetic chronic kidney disease: Secondary | ICD-10-CM

## 2020-05-06 DIAGNOSIS — E1121 Type 2 diabetes mellitus with diabetic nephropathy: Secondary | ICD-10-CM | POA: Diagnosis not present

## 2020-05-06 DIAGNOSIS — Z794 Long term (current) use of insulin: Secondary | ICD-10-CM | POA: Diagnosis not present

## 2020-05-06 DIAGNOSIS — N1831 Chronic kidney disease, stage 3a: Secondary | ICD-10-CM | POA: Diagnosis not present

## 2020-05-06 LAB — POCT GLYCOSYLATED HEMOGLOBIN (HGB A1C): Hemoglobin A1C: 8 % — AB (ref 4.0–5.6)

## 2020-05-06 MED ORDER — NOVOLIN 70/30 RELION (70-30) 100 UNIT/ML ~~LOC~~ SUSP: 31.0000 [IU] | Freq: Every day | SUBCUTANEOUS | 11 refills | Status: DC

## 2020-05-06 NOTE — Patient Instructions (Addendum)
Please increase the insulin to 31 units with breakfast.  On this type of insulin schedule, you should eat meals on a regular schedule.  If a meal is missed or significantly delayed, your blood sugar could go low.  check your blood sugar twice a day.  vary the time of day when you check, between before the 3 meals, and at bedtime.  also check if you have symptoms of your blood sugar being too high or too low.  please keep a record of the readings and bring it to your next appointment here (or you can bring the meter itself).  You can write it on any piece of paper.  please call us sooner if your blood sugar goes below 70, or if you have a lot of readings over 200. Please come back for a follow-up appointment in 2-3 months.

## 2020-05-06 NOTE — Progress Notes (Signed)
Subjective:    Patient ID: [name], female    DOB: Aug 31, 1931, 86 y.o.   MRN: [id number]  HPI Pt returns for f/u of diabetes mellitus:  DM type: Insulin-requiring type 2.   Dx'ed: [id number] Complications: CAD, PN, PAD, and CRI.  Therapy: insulin since 2007, and Jardiance.   GDM: never.  DKA: never. Severe hypoglycemia: never.   Pancreatitis: never.   SDOH: she takes human insulin, due to cost.   Other: she changed to qd insulin, after poor results with multiple daily injections; she was changed from lantus to QAM NPH, then to 70/30, due to am hypoglycemia and PM hyperglycemia.   Interval history: no cbg record, but states cbg varies from 60-230, but most are in the mid-100's.  It is lowest when a meal is missed.  She has mild hypoglycemia approx once per month.  Pt also has hyperthyroidism (dx'ed 2012; [place] then showed multinodular goiter; tapazole was chosen as rx, due to low uptake on nuc med scan; tapazole was stopped, due to rash; plan will be for RAI when this recurs off rx).   Past Medical History:  Diagnosis Date  . Anemia    iron defficiency  . Anginal pain ([place])   . Arthritis    "knees, feet, hands; joints" (05/27/2015)  . Asthma   . Atrial fibrillation or flutter    s/p RFCA 7/08;   s/p DCCV in past;   previously on amiodarone;  amio stopped due to lung toxicity  . CAD (coronary artery disease)    s/p NSTEMI tx with BMS to OM1 3/08;  cath 3/08: pOM 99% tx with PCI, pLAD 20%, ? mod stenosis at the AM  . CAP (community acquired pneumonia) 05/24/2015  . Chronic diastolic heart failure (HCC)    echo 11/11:  EF 55-60%, severe LVH, mod LAE, mild MR, mildly increased PASP  . COPD (chronic obstructive pulmonary disease) ([place])   . Degenerative joint disease   . Dystrophy, corneal stromal   . Heart murmur   . HLD (hyperlipidemia)   . HTN (hypertension)    essential nos  . Hypopotassemia    PMH of  . Muscle pain   . Myocardial infarction ([place]) 2008  . OSA on CPAP   .  Osteoporosis   . Pneumonia 05/27/2015  . Protein calorie malnutrition ([place])   . Rash and nonspecific skin eruption    both arms,awaiting bio   dr Delman Cheadle  . Seasonal allergies   . Shortness of breath dyspnea   . Type II diabetes mellitus ([place])    Dr Loanne Drilling    Past Surgical History:  Procedure Laterality Date  . A FLUTTER ABLATION     Dr Lovena Le  . ABDOMINAL AORTOGRAM W/LOWER EXTREMITY Bilateral 04/11/2020   Procedure: ABDOMINAL AORTOGRAM W/LOWER EXTREMITY;  Surgeon: Angelia Mould, MD;  Location: [facility];  Service: Cardiovascular;  Laterality: Bilateral;  . CATARACT EXTRACTION W/ INTRAOCULAR LENS  IMPLANT, BILATERAL Bilateral   . CHOLECYSTECTOMY    . COLONOSCOPY  6/07   2 polyps Dr. Kinnie Feil in [place]  . colonoscopy with polypectomy      X 2;  GI  . CORNEAL TRANSPLANT Bilateral   . CORONARY ANGIOPLASTY WITH STENT PLACEMENT  02/2007   BMS w/Dr Lovena Le  . EYE SURGERY    . gravid 2 para 2    . KNEE CARTILAGE SURGERY Right   . LEFT AND RIGHT HEART CATHETERIZATION WITH CORONARY ANGIOGRAM N/A 05/07/2014   Procedure: LEFT AND RIGHT HEART  CATHETERIZATION WITH CORONARY ANGIOGRAM;  Surgeon: Peter M Martinique, MD;  Location: [facility];  Service: Cardiovascular;  Laterality: N/A;  . PERIPHERAL VASCULAR INTERVENTION  04/11/2020   Procedure: PERIPHERAL VASCULAR INTERVENTION;  Surgeon: Angelia Mould, MD;  Location: [facility];  Service: Cardiovascular;;  . TOTAL KNEE ARTHROPLASTY Right 06/28/2016   Procedure: TOTAL KNEE ARTHROPLASTY;  Surgeon: Frederik Pear, MD;  Location: [facility];  Service: Orthopedics;  Laterality: Right;    Social History   Socioeconomic History  . Marital status: Widowed    Spouse name: Not on file  . Number of children: 2  . Years of education: Not on file  . Highest education level: Not on file  Occupational History  . Occupation: Recruitment consultant Retired  Tobacco Use  . Smoking status: Former Smoker    Packs/day: 0.25    Years: 18.00    Pack  years: 4.50    Types: Cigarettes    Quit date: 12/27/1968    Years since quitting: 51.3  . Smokeless tobacco: Never Used  . Tobacco comment: smoked [place], up to 1 pp week  Substance and Sexual Activity  . Alcohol use: Yes    Comment: occ  . Drug use: No  . Sexual activity: Never  Other Topics Concern  . Not on file  Social History Narrative   Teacher. Widowed. Rarely drinks cafeine.    Social Determinants of Health   Financial Resource Strain:   . Difficulty of Paying Living Expenses:   Food Insecurity:   . Worried About Charity fundraiser in the Last Year:   . Arboriculturist in the Last Year:   Transportation Needs:   . Film/video editor (Medical):   Marland Kitchen Lack of Transportation (Non-Medical):   Physical Activity: Insufficiently Active  . Days of Exercise per Week: 4 days  . Minutes of Exercise per Session: 30 min  Stress: No Stress Concern Present  . Feeling of Stress : Only a little  Social Connections: Slightly Isolated  . Frequency of Communication with Friends and Family: More than three times a week  . Frequency of Social Gatherings with Friends and Family: More than three times a week  . Attends Religious Services: 1 to 4 times per year  . Active Member of Clubs or Organizations: Yes  . Attends Archivist Meetings: 1 to 4 times per year  . Marital Status: Widowed  Intimate Partner Violence:   . Fear of Current or Ex-Partner:   . Emotionally Abused:   Marland Kitchen Physically Abused:   . Sexually Abused:     Current Outpatient Medications on File Prior to Visit  Medication Sig Dispense Refill  . acetaminophen (TYLENOL) 500 MG tablet Take 1,000 mg by mouth every 6 (six) hours as needed (pain).    . CALCIUM-MAG-VIT C-VIT D PO Take 1 tablet by mouth daily.    . carvedilol (COREG) 6.25 MG tablet TAKE 1 TABLET BY MOUTH TWO  TIMES DAILY WITH MEALS 180 tablet 3  . cholecalciferol (VITAMIN D) 1000 units tablet Take 1,000 Units by mouth daily after supper.    .  Cold Sore Products (L-LYSINE EX) Apply topically.    Marland Kitchen ELIQUIS 5 MG TABS tablet TAKE 1 TABLET BY MOUTH  TWICE DAILY 180 tablet 3  . empagliflozin (JARDIANCE) 10 MG TABS tablet Take 10 mg by mouth daily before breakfast. 90 tablet 3  . Evolocumab (REPATHA SURECLICK) XX123456 MG/ML SOAJ Inject 1 Dose into the skin every 14 (fourteen) days.  2 pen 11  . furosemide (LASIX) 80 MG tablet TAKE 1 TABLET BY MOUTH TWO  TIMES DAILY 180 tablet 3  . glucose blood (ONETOUCH VERIO) test strip Use to monitor glucose levels 2 times daily 200 each 5  . Lancets (ONETOUCH DELICA PLUS [id number]) MISC USE TO MONITOR GLUCOSE  LEVELS TWICE DAILY 200 each 3  . Multiple Vitamin (MULTIVITAMIN WITH MINERALS) TABS tablet Take 1 tablet by mouth daily. Centrum Silver    . nitroGLYCERIN (NITROSTAT) 0.4 MG SL tablet Place 1 tablet (0.4 mg total) under the tongue every 5 (five) minutes as needed for chest pain. Reported on 01/28/2016 30 tablet 3  . potassium chloride SA (KLOR-CON) 20 MEQ tablet TAKE 2 TABLETS BY MOUTH  DAILY 180 tablet 3  . tiotropium (SPIRIVA HANDIHALER) 18 MCG inhalation capsule Place 1 capsule (18 mcg total) into inhaler and inhale daily. 90 capsule 1  . ezetimibe (ZETIA) 10 MG tablet Take 1 tablet (10 mg total) by mouth daily. 90 tablet 3   No current facility-administered medications on file prior to visit.    Allergies  Allergen Reactions  . Amlodipine Besylate Other (See Comments)    REACTION: tingling in lips & gum edema  . Levaquin [Levofloxacin] Shortness Of Breath and Swelling    angioedema  . Lobster [Shellfish Allergy] Other (See Comments)    angioedema  . Penicillins Rash    Has patient had a PCN reaction causing immediate rash, facial/tongue/throat swelling, SOB or lightheadedness with hypotension: Yes Has patient had a PCN reaction causing severe rash involving mucus membranes or skin necrosis: No Has patient had a PCN reaction that required hospitalization: No Has patient had a PCN reaction  occurring within the last 10 years: No If all of the above answers are "NO", then may proceed with Cephalosporin use.  . Valsartan Other (See Comments)    REACTION: angioedema  . Codeine Other (See Comments)    Mental status changes  . Lipitor [Atorvastatin] Other (See Comments)    weakness  . Oxycodone Other (See Comments)    hallucinations  . Statins     Made her too weak  . Tramadol   . Crestor [Rosuvastatin Calcium] Rash  . Tramadol Hcl Nausea And Vomiting    Family History  Problem Relation Age of Onset  . Diabetes Mother   . Hypertension Mother   . Transient ischemic attack Mother   . Arthritis Mother   . Heart attack Father 29  . Arthritis Father   . Hypertension Father   . Breast cancer Maternal Aunt   . Arthritis Maternal Aunt     BP 140/78   Pulse 95   Ht 5\' 3"  (1.6 m)   Wt 166 lb (75.3 kg)   SpO2 95%   BMI 29.41 kg/m    Review of Systems Denies LOV    Objective:   Physical Exam VITAL SIGNS:  See vs page GENERAL: no distress Pulses: dorsalis pedis decreased bilaterally MSK: no deformity of the feet CV: 1+ bilat leg edema Skin:  no ulcer on the feet.  normal temp on the feet.  There is chronic erythema and hyperpigmentation of the feet.   Neuro: sensation is intact to touch on the feet.   Ext: there is bilateral onychomycosis of the toenails.    Lab Results  Component Value Date   HGBA1C 8.0 (A) 05/06/2020   Lab Results  Component Value Date   CREATININE 1.20 (H) 04/11/2020   BUN 27 (H) 04/11/2020   NA 141  04/11/2020   K 4.4 04/11/2020   CL 106 04/11/2020   CO2 28 02/20/2020       Assessment & Plan:  Insulin-requiring type 2 DM, with PAD: she needs increased rx Hypoglycemia, due to insulin: this limits aggressiveness of glycemic control.  We'll increase the insulin minimally today.   Patient Instructions  Please increase the insulin to 31 units with breakfast.  On this type of insulin schedule, you should eat meals on a regular  schedule.  If a meal is missed or significantly delayed, your blood sugar could go low.  check your blood sugar twice a day.  vary the time of day when you check, between before the 3 meals, and at bedtime.  also check if you have symptoms of your blood sugar being too high or too low.  please keep a record of the readings and bring it to your next appointment here (or you can bring the meter itself).  You can write it on any piece of paper.  please call us sooner if your blood sugar goes below 70, or if you have a lot of readings over 200. Please come back for a follow-up appointment in 2-3 months.

## 2020-05-07 DIAGNOSIS — R2689 Other abnormalities of gait and mobility: Secondary | ICD-10-CM | POA: Diagnosis not present

## 2020-05-07 DIAGNOSIS — M6281 Muscle weakness (generalized): Secondary | ICD-10-CM | POA: Diagnosis not present

## 2020-05-08 DIAGNOSIS — M6281 Muscle weakness (generalized): Secondary | ICD-10-CM | POA: Diagnosis not present

## 2020-05-08 DIAGNOSIS — R2689 Other abnormalities of gait and mobility: Secondary | ICD-10-CM | POA: Diagnosis not present

## 2020-05-12 ENCOUNTER — Telehealth (HOSPITAL_COMMUNITY): Payer: Self-pay | Admitting: *Deleted

## 2020-05-12 DIAGNOSIS — R2689 Other abnormalities of gait and mobility: Secondary | ICD-10-CM | POA: Diagnosis not present

## 2020-05-12 DIAGNOSIS — M6281 Muscle weakness (generalized): Secondary | ICD-10-CM | POA: Diagnosis not present

## 2020-05-12 NOTE — Telephone Encounter (Signed)
Pt left VM stating she has an appt with Dr.Bensimhon in July but feels she needs to be seen sooner. Pt is having issues with circulation and swelling in legs and feet. Pt is also having to use nitroglycerin more often than usual. I called pt to offer her an appt with APP because Dr.Bensimhon is booked until august pt did not answer and her voicemail box is full. I called [phone] as requested by pt.

## 2020-05-13 DIAGNOSIS — R2689 Other abnormalities of gait and mobility: Secondary | ICD-10-CM | POA: Diagnosis not present

## 2020-05-13 DIAGNOSIS — M6281 Muscle weakness (generalized): Secondary | ICD-10-CM | POA: Diagnosis not present

## 2020-05-14 DIAGNOSIS — G4733 Obstructive sleep apnea (adult) (pediatric): Secondary | ICD-10-CM | POA: Diagnosis not present

## 2020-05-15 ENCOUNTER — Emergency Department (HOSPITAL_COMMUNITY): Payer: Medicare Other

## 2020-05-15 ENCOUNTER — Inpatient Hospital Stay (HOSPITAL_COMMUNITY)
Admission: EM | Admit: 2020-05-15 | Discharge: 2020-05-17 | DRG: 247 | Disposition: A | Discharge status: Home / Self Care | Payer: Medicare Other | Attending: Internal Medicine | Admitting: Internal Medicine

## 2020-05-15 ENCOUNTER — Other Ambulatory Visit: Payer: Self-pay

## 2020-05-15 ENCOUNTER — Encounter (HOSPITAL_COMMUNITY): Payer: Self-pay

## 2020-05-15 DIAGNOSIS — Z7901 Long term (current) use of anticoagulants: Secondary | ICD-10-CM

## 2020-05-15 DIAGNOSIS — Z8249 Family history of ischemic heart disease and other diseases of the circulatory system: Secondary | ICD-10-CM

## 2020-05-15 DIAGNOSIS — R61 Generalized hyperhidrosis: Secondary | ICD-10-CM | POA: Diagnosis present

## 2020-05-15 DIAGNOSIS — Z96651 Presence of right artificial knee joint: Secondary | ICD-10-CM | POA: Diagnosis present

## 2020-05-15 DIAGNOSIS — Z91013 Allergy to seafood: Secondary | ICD-10-CM

## 2020-05-15 DIAGNOSIS — J449 Chronic obstructive pulmonary disease, unspecified: Secondary | ICD-10-CM | POA: Diagnosis present

## 2020-05-15 DIAGNOSIS — I25110 Atherosclerotic heart disease of native coronary artery with unstable angina pectoris: Secondary | ICD-10-CM | POA: Diagnosis not present

## 2020-05-15 DIAGNOSIS — M810 Age-related osteoporosis without current pathological fracture: Secondary | ICD-10-CM | POA: Diagnosis not present

## 2020-05-15 DIAGNOSIS — R0789 Other chest pain: Secondary | ICD-10-CM | POA: Diagnosis not present

## 2020-05-15 DIAGNOSIS — I10 Essential (primary) hypertension: Secondary | ICD-10-CM | POA: Diagnosis not present

## 2020-05-15 DIAGNOSIS — Z8261 Family history of arthritis: Secondary | ICD-10-CM | POA: Diagnosis not present

## 2020-05-15 DIAGNOSIS — Z880 Allergy status to penicillin: Secondary | ICD-10-CM

## 2020-05-15 DIAGNOSIS — E1151 Type 2 diabetes mellitus with diabetic peripheral angiopathy without gangrene: Secondary | ICD-10-CM | POA: Diagnosis not present

## 2020-05-15 DIAGNOSIS — I214 Non-ST elevation (NSTEMI) myocardial infarction: Secondary | ICD-10-CM | POA: Diagnosis not present

## 2020-05-15 DIAGNOSIS — I4892 Unspecified atrial flutter: Secondary | ICD-10-CM | POA: Diagnosis present

## 2020-05-15 DIAGNOSIS — E1169 Type 2 diabetes mellitus with other specified complication: Secondary | ICD-10-CM | POA: Diagnosis present

## 2020-05-15 DIAGNOSIS — I340 Nonrheumatic mitral (valve) insufficiency: Secondary | ICD-10-CM | POA: Diagnosis not present

## 2020-05-15 DIAGNOSIS — I5032 Chronic diastolic (congestive) heart failure: Secondary | ICD-10-CM | POA: Diagnosis present

## 2020-05-15 DIAGNOSIS — R079 Chest pain, unspecified: Secondary | ICD-10-CM | POA: Diagnosis not present

## 2020-05-15 DIAGNOSIS — Z95820 Peripheral vascular angioplasty status with implants and grafts: Secondary | ICD-10-CM | POA: Diagnosis not present

## 2020-05-15 DIAGNOSIS — I110 Hypertensive heart disease with heart failure: Secondary | ICD-10-CM | POA: Diagnosis present

## 2020-05-15 DIAGNOSIS — I272 Pulmonary hypertension, unspecified: Secondary | ICD-10-CM | POA: Diagnosis not present

## 2020-05-15 DIAGNOSIS — I5042 Chronic combined systolic (congestive) and diastolic (congestive) heart failure: Secondary | ICD-10-CM | POA: Diagnosis not present

## 2020-05-15 DIAGNOSIS — R0602 Shortness of breath: Secondary | ICD-10-CM | POA: Diagnosis not present

## 2020-05-15 DIAGNOSIS — I440 Atrioventricular block, first degree: Secondary | ICD-10-CM | POA: Diagnosis present

## 2020-05-15 DIAGNOSIS — E785 Hyperlipidemia, unspecified: Secondary | ICD-10-CM | POA: Diagnosis present

## 2020-05-15 DIAGNOSIS — E1122 Type 2 diabetes mellitus with diabetic chronic kidney disease: Secondary | ICD-10-CM

## 2020-05-15 DIAGNOSIS — I252 Old myocardial infarction: Secondary | ICD-10-CM | POA: Diagnosis not present

## 2020-05-15 DIAGNOSIS — Z888 Allergy status to other drugs, medicaments and biological substances status: Secondary | ICD-10-CM | POA: Diagnosis not present

## 2020-05-15 DIAGNOSIS — Z803 Family history of malignant neoplasm of breast: Secondary | ICD-10-CM

## 2020-05-15 DIAGNOSIS — Z885 Allergy status to narcotic agent status: Secondary | ICD-10-CM

## 2020-05-15 DIAGNOSIS — E782 Mixed hyperlipidemia: Secondary | ICD-10-CM | POA: Diagnosis present

## 2020-05-15 DIAGNOSIS — I480 Paroxysmal atrial fibrillation: Secondary | ICD-10-CM | POA: Diagnosis present

## 2020-05-15 DIAGNOSIS — G4733 Obstructive sleep apnea (adult) (pediatric): Secondary | ICD-10-CM | POA: Diagnosis not present

## 2020-05-15 DIAGNOSIS — Z743 Need for continuous supervision: Secondary | ICD-10-CM | POA: Diagnosis not present

## 2020-05-15 DIAGNOSIS — I200 Unstable angina: Secondary | ICD-10-CM | POA: Diagnosis not present

## 2020-05-15 DIAGNOSIS — Z794 Long term (current) use of insulin: Secondary | ICD-10-CM

## 2020-05-15 DIAGNOSIS — E119 Type 2 diabetes mellitus without complications: Secondary | ICD-10-CM

## 2020-05-15 DIAGNOSIS — M1990 Unspecified osteoarthritis, unspecified site: Secondary | ICD-10-CM | POA: Diagnosis present

## 2020-05-15 DIAGNOSIS — Z20822 Contact with and (suspected) exposure to covid-19: Secondary | ICD-10-CM | POA: Diagnosis not present

## 2020-05-15 DIAGNOSIS — R0902 Hypoxemia: Secondary | ICD-10-CM | POA: Diagnosis not present

## 2020-05-15 DIAGNOSIS — Z833 Family history of diabetes mellitus: Secondary | ICD-10-CM | POA: Diagnosis not present

## 2020-05-15 DIAGNOSIS — Z79899 Other long term (current) drug therapy: Secondary | ICD-10-CM

## 2020-05-15 DIAGNOSIS — Z955 Presence of coronary angioplasty implant and graft: Secondary | ICD-10-CM

## 2020-05-15 DIAGNOSIS — Z87891 Personal history of nicotine dependence: Secondary | ICD-10-CM

## 2020-05-15 DIAGNOSIS — I2721 Secondary pulmonary arterial hypertension: Secondary | ICD-10-CM | POA: Diagnosis not present

## 2020-05-15 LAB — MAGNESIUM: Magnesium: 1.9 mg/dL (ref 1.7–2.4)

## 2020-05-15 LAB — BASIC METABOLIC PANEL
Anion gap: 11 (ref 5–15)
BUN: 22 mg/dL (ref 8–23)
CO2: 29 mmol/L (ref 22–32)
Calcium: 10.1 mg/dL (ref 8.9–10.3)
Chloride: 97 mmol/L — ABNORMAL LOW (ref 98–111)
Creatinine, Ser: 1.09 mg/dL — ABNORMAL HIGH (ref 0.44–1.00)
GFR calc Af Amer: 53 mL/min — ABNORMAL LOW (ref >60)
GFR calc non Af Amer: 46 mL/min — ABNORMAL LOW (ref >60)
Glucose, Bld: 271 mg/dL — ABNORMAL HIGH (ref 70–99)
Potassium: 4.5 mmol/L (ref 3.5–5.1)
Sodium: 137 mmol/L (ref 135–145)

## 2020-05-15 LAB — TSH: TSH: 0.866 u[IU]/mL (ref 0.350–4.500)

## 2020-05-15 LAB — CBC
HCT: 52.5 % — ABNORMAL HIGH (ref 36.0–46.0)
Hemoglobin: 16.3 g/dL — ABNORMAL HIGH (ref 12.0–15.0)
MCH: 30.5 pg (ref 26.0–34.0)
MCHC: 31 g/dL (ref 30.0–36.0)
MCV: 98.3 fL (ref 80.0–100.0)
Platelets: 191 10*3/uL (ref 150–400)
RBC: 5.34 MIL/uL — ABNORMAL HIGH (ref 3.87–5.11)
RDW: 13.7 % (ref 11.5–15.5)
WBC: 4.5 10*3/uL (ref 4.0–10.5)
nRBC: 0 % (ref 0.0–0.2)

## 2020-05-15 LAB — BRAIN NATRIURETIC PEPTIDE: B Natriuretic Peptide: 269 pg/mL — ABNORMAL HIGH (ref 0.0–100.0)

## 2020-05-15 LAB — TROPONIN I (HIGH SENSITIVITY)
Troponin I (High Sensitivity): 191 ng/L (ref <18)
Troponin I (High Sensitivity): 626 ng/L (ref <18)

## 2020-05-15 LAB — CBG MONITORING, ED
Glucose-Capillary: 204 mg/dL — ABNORMAL HIGH (ref 70–99)
Glucose-Capillary: 83 mg/dL (ref 70–99)

## 2020-05-15 LAB — GLUCOSE, CAPILLARY: Glucose-Capillary: 230 mg/dL — ABNORMAL HIGH (ref 70–99)

## 2020-05-15 LAB — HEMOGLOBIN A1C
Hgb A1c MFr Bld: 8.3 % — ABNORMAL HIGH (ref 4.8–5.6)
Mean Plasma Glucose: 191.51 mg/dL

## 2020-05-15 LAB — SARS CORONAVIRUS 2 BY RT PCR (HOSPITAL ORDER, PERFORMED IN ~~LOC~~ HOSPITAL LAB): SARS Coronavirus 2: NEGATIVE

## 2020-05-15 MED ORDER — ASPIRIN 81 MG PO CHEW
81.0000 mg | CHEWABLE_TABLET | ORAL | Status: AC
  Administered 2020-05-16: 81 mg via ORAL
  Filled 2020-05-15: qty 1

## 2020-05-15 MED ORDER — NITROGLYCERIN 0.4 MG SL SUBL: 0.4000 mg | SUBLINGUAL_TABLET | SUBLINGUAL | Status: DC | PRN

## 2020-05-15 MED ORDER — SODIUM CHLORIDE 0.9 % WEIGHT BASED INFUSION
1.0000 mL/kg/h | INTRAVENOUS | Status: DC
  Administered 2020-05-16: 1 mL/kg/h via INTRAVENOUS

## 2020-05-15 MED ORDER — HEPARIN (PORCINE) 25000 UT/250ML-% IV SOLN
800.0000 [IU]/h | INTRAVENOUS | Status: DC
  Administered 2020-05-15: 800 [IU]/h via INTRAVENOUS
  Filled 2020-05-15: qty 250

## 2020-05-15 MED ORDER — SODIUM CHLORIDE 0.9 % IV SOLN: 250.0000 mL | INTRAVENOUS | Status: DC | PRN

## 2020-05-15 MED ORDER — SODIUM CHLORIDE 0.9% FLUSH
3.0000 mL | Freq: Two times a day (BID) | INTRAVENOUS | Status: DC
  Administered 2020-05-16: 3 mL via INTRAVENOUS

## 2020-05-15 MED ORDER — ASPIRIN 300 MG RE SUPP: 300.0000 mg | RECTAL | Status: AC

## 2020-05-15 MED ORDER — SODIUM CHLORIDE 0.9 % IV SOLN: INTRAVENOUS | Status: DC

## 2020-05-15 MED ORDER — SODIUM CHLORIDE 0.9% FLUSH: 3.0000 mL | INTRAVENOUS | Status: DC | PRN

## 2020-05-15 MED ORDER — EZETIMIBE 10 MG PO TABS
10.0000 mg | ORAL_TABLET | Freq: Every day | ORAL | Status: DC
  Administered 2020-05-15 – 2020-05-17 (×2): 10 mg via ORAL
  Filled 2020-05-15 (×2): qty 1

## 2020-05-15 MED ORDER — ALPRAZOLAM 0.25 MG PO TABS
0.2500 mg | ORAL_TABLET | Freq: Two times a day (BID) | ORAL | Status: DC | PRN
  Administered 2020-05-15: 0.25 mg via ORAL
  Filled 2020-05-15: qty 1

## 2020-05-15 MED ORDER — ASPIRIN 81 MG PO CHEW: 324.0000 mg | CHEWABLE_TABLET | ORAL | Status: AC

## 2020-05-15 MED ORDER — FUROSEMIDE 80 MG PO TABS: 80.0000 mg | ORAL_TABLET | Freq: Two times a day (BID) | ORAL | Status: DC

## 2020-05-15 MED ORDER — CARVEDILOL 6.25 MG PO TABS
6.2500 mg | ORAL_TABLET | Freq: Two times a day (BID) | ORAL | Status: DC
  Administered 2020-05-15 – 2020-05-17 (×3): 6.25 mg via ORAL
  Filled 2020-05-15 (×3): qty 1

## 2020-05-15 MED ORDER — SODIUM CHLORIDE 0.9% FLUSH: 3.0000 mL | Freq: Once | INTRAVENOUS | Status: DC

## 2020-05-15 MED ORDER — NITROGLYCERIN IN D5W 200-5 MCG/ML-% IV SOLN
0.0000 ug/min | INTRAVENOUS | Status: DC
  Administered 2020-05-15: 5 ug/min via INTRAVENOUS
  Filled 2020-05-15: qty 250

## 2020-05-15 MED ORDER — ONDANSETRON HCL 4 MG/2ML IJ SOLN: 4.0000 mg | Freq: Four times a day (QID) | INTRAMUSCULAR | Status: DC | PRN

## 2020-05-15 MED ORDER — INSULIN ASPART PROT & ASPART (70-30 MIX) 100 UNIT/ML ~~LOC~~ SUSP
10.0000 [IU] | Freq: Every day | SUBCUTANEOUS | Status: DC
  Administered 2020-05-17: 10 [IU] via SUBCUTANEOUS
  Filled 2020-05-15 (×2): qty 10

## 2020-05-15 MED ORDER — ACETAMINOPHEN 325 MG PO TABS: 650.0000 mg | ORAL_TABLET | ORAL | Status: DC | PRN

## 2020-05-15 MED ORDER — SODIUM CHLORIDE 0.9 % WEIGHT BASED INFUSION
3.0000 mL/kg/h | INTRAVENOUS | Status: DC
  Administered 2020-05-16: 3 mL/kg/h via INTRAVENOUS

## 2020-05-15 MED ORDER — POTASSIUM CHLORIDE CRYS ER 20 MEQ PO TBCR
40.0000 meq | EXTENDED_RELEASE_TABLET | Freq: Every day | ORAL | Status: DC
  Administered 2020-05-15 – 2020-05-17 (×2): 40 meq via ORAL
  Filled 2020-05-15 (×2): qty 2

## 2020-05-15 MED ORDER — ASPIRIN EC 81 MG PO TBEC
81.0000 mg | DELAYED_RELEASE_TABLET | Freq: Every day | ORAL | Status: DC
  Administered 2020-05-17: 81 mg via ORAL
  Filled 2020-05-15: qty 1

## 2020-05-15 MED ORDER — ACETAMINOPHEN 500 MG PO TABS
1000.0000 mg | ORAL_TABLET | Freq: Four times a day (QID) | ORAL | Status: DC | PRN
  Administered 2020-05-16: 1000 mg via ORAL
  Filled 2020-05-15: qty 2

## 2020-05-15 MED ORDER — INSULIN ASPART 100 UNIT/ML ~~LOC~~ SOLN
0.0000 [IU] | Freq: Every day | SUBCUTANEOUS | Status: DC
  Administered 2020-05-15 – 2020-05-16 (×2): 2 [IU] via SUBCUTANEOUS

## 2020-05-15 MED ORDER — UMECLIDINIUM BROMIDE 62.5 MCG/INH IN AEPB
1.0000 | INHALATION_SPRAY | Freq: Every day | RESPIRATORY_TRACT | Status: DC
  Filled 2020-05-15: qty 7

## 2020-05-15 MED ORDER — INSULIN ASPART 100 UNIT/ML ~~LOC~~ SOLN
0.0000 [IU] | Freq: Three times a day (TID) | SUBCUTANEOUS | Status: DC
  Administered 2020-05-15: 0 [IU] via SUBCUTANEOUS
  Administered 2020-05-16: 2 [IU] via SUBCUTANEOUS
  Administered 2020-05-17: 4 [IU] via SUBCUTANEOUS
  Administered 2020-05-17: 1 [IU] via SUBCUTANEOUS

## 2020-05-15 NOTE — ED Notes (Signed)
Pt reports new onset pain to mid sternal chest.  Has been pain free while on Nitro drip.   Pt in NAD, without outward pain responses, quiet calm speech.  States she has increased dyspnea as well.  [facility] nurse notified of change in symptoms.

## 2020-05-15 NOTE — ED Triage Notes (Signed)
Pt bib gcems from [facility] w/ c/o intermittent, non-radiating, 5/10 chest tightness x 1 week. Pt has been taking her nitro w/ some relief but then chest tightness comes back. Pt received 1 nitro and 324 mg aspirin w/ EMS and is currently chest pain free. Pt has hx of CHF, MI. Pt denies SOB, n/v. EMS 12 lead unremarkable, VSS.

## 2020-05-15 NOTE — Progress Notes (Signed)
ANTICOAGULATION CONSULT NOTE - Initial Consult  Pharmacy Consult for Heparin Indication: chest pain/ACS  Allergies  Allergen Reactions  . Amlodipine Besylate Other (See Comments)    REACTION: tingling in lips & gum edema  . Levaquin [Levofloxacin] Shortness Of Breath and Swelling    angioedema  . Lobster [Shellfish Allergy] Other (See Comments)    angioedema  . Penicillins Rash    Has patient had a PCN reaction causing immediate rash, facial/tongue/throat swelling, SOB or lightheadedness with hypotension: Yes Has patient had a PCN reaction causing severe rash involving mucus membranes or skin necrosis: No Has patient had a PCN reaction that required hospitalization: No Has patient had a PCN reaction occurring within the last 10 years: No If all of the above answers are "NO", then may proceed with Cephalosporin use.  . Valsartan Other (See Comments)    REACTION: angioedema  . Codeine Other (See Comments)    Mental status changes  . Lipitor [Atorvastatin] Other (See Comments)    weakness  . Oxycodone Other (See Comments)    hallucinations  . Statins     Made her too weak  . Tramadol   . Crestor [Rosuvastatin Calcium] Rash  . Tramadol Hcl Nausea And Vomiting    Patient Measurements: Height: 5\' 3"  (160 cm) Weight: 74.8 kg (165 lb) IBW/kg (Calculated) : 52.4 Heparin Dosing Weight: 68.3 kg  Vital Signs: Temp: 98.4 F (36.9 C) (05/20 1158) Temp Source: Oral (05/20 1158) BP: 167/99 (05/20 1430) Pulse Rate: 79 (05/20 1430)  Labs: Recent Labs    05/15/20 1202 05/15/20 1400  HGB 16.3*  --   HCT 52.5*  --   PLT 191  --   CREATININE 1.09*  --   TROPONINIHS 191* 626*    Estimated Creatinine Clearance: 35.9 mL/min (A) (by C-G formula based on SCr of 1.09 mg/dL (H)).   Medical History: Past Medical History:  Diagnosis Date  . Anemia    iron defficiency  . Anginal pain ([place])   . Arthritis    "knees, feet, hands; joints" (05/27/2015)  . Asthma   . Atrial  fibrillation or flutter    s/p RFCA 7/08;   s/p DCCV in past;   previously on amiodarone;  amio stopped due to lung toxicity  . CAD (coronary artery disease)    s/p NSTEMI tx with BMS to OM1 3/08;  cath 3/08: pOM 99% tx with PCI, pLAD 20%, ? mod stenosis at the AM  . CAP (community acquired pneumonia) 05/24/2015  . Chronic diastolic heart failure (HCC)    echo 11/11:  EF 55-60%, severe LVH, mod LAE, mild MR, mildly increased PASP  . COPD (chronic obstructive pulmonary disease) ([place])   . Degenerative joint disease   . Dystrophy, corneal stromal   . Heart murmur   . HLD (hyperlipidemia)   . HTN (hypertension)    essential nos  . Hypopotassemia    PMH of  . Muscle pain   . Myocardial infarction ([place]) 2008  . OSA on CPAP   . Osteoporosis   . Pneumonia 05/27/2015  . Protein calorie malnutrition ([place])   . Rash and nonspecific skin eruption    both arms,awaiting bio   dr Delman Cheadle  . Seasonal allergies   . Shortness of breath dyspnea   . Type II diabetes mellitus ([place])    Dr Loanne Drilling   Assessment: 86 year-old female presenting with chest pain. Patient takes Eliquis for atrial fibrillation/flutter. Pharmacy consulted to dose heparin.  Last dose of Eliquis this morning at  1000. Hemoglobin currently WNL at 16.3.   Goal of Therapy:  Heparin level 0.3-0.7 units/ml Monitor platelets by anticoagulation protocol: Yes   Plan:  Hold home Eliquis Start heparin IV at 800 units/hour at 2200 aPTT and heparin level with AM labs Monitor for signs and symptoms of bleed Daily CBCs and heparin level     Stanley Lyness L. Devin Going, [place] PGY1 Pharmacy Resident [phone] 05/15/20      4:35 PM  Please check AMION for all [place] phone numbers After 10:00 PM, call the [facility] [phone]

## 2020-05-15 NOTE — ED Notes (Addendum)
Pt family reports that pt feels hypoglycemic and has h/o DM. CBG checked, pt CBG 204mg /dL

## 2020-05-15 NOTE — ED Provider Notes (Signed)
[facility] EMERGENCY DEPARTMENT Provider Note   CSN: [id number] Arrival date & time: 05/15/20  1155     History Chief Complaint  Patient presents with   Chest Pain    [name] is a 86 y.o. female.  Patient is an 86 year old female with a history of atrial fibrillation on Eliquis, coronary artery disease status post stent in 2007, CHF, COPD, and diabetes who is presenting today with chest pain.  Patient states for the last 1 week she has had intermittent episodes of chest pain.  She describes it as a heavy tight sensation in the left side of her chest that causes shortness of breath.  She states it happens any given time during the day and does not seem to be brought on by any particular activity.  She does have nitroglycerin at home in earlier this week when she would have an episode she did take nitroglycerin and make the pain go away for at least an hour but then it would come back at times and she would sometimes take more than 1 nitroglycerin in a day.  She woke up last night with the pain and it improved with nitroglycerin but never went away.  She went down to have breakfast this morning and reported she was not very hungry but the pain just continued to get worse.  She was going to have her daughter drive her to the emergency room but the new assisted living facility where she lives insisted that they call 911.  Patient was given aspirin and nitroglycerin in route and reports now her pain is completely resolved.  It did cause shortness of breath and no appetite but did not radiate or cause vomiting or diaphoresis.  She reports before this week she would use nitroglycerin maybe 1 time 2 or 3 months spaced out.  She continues to take her Lasix and reports no new or worsening swelling in her lower extremities.  She recently did have a vascular procedure for peripheral artery disease and reports things have improved.  She has been under a lot of stress this week as she  found out her son was recently diagnosed with cancer and states she must not be taking it very well.  Otherwise no new medication changes and she denies any recent new respiratory symptoms such as productive cough, fever, abdominal pain, diarrhea or change in diet.  The history is provided by the patient.  Chest Pain Pain location:  L chest and substernal area Pain quality: aching and dull   Pain radiates to:  Does not radiate Pain severity:  Moderate Onset quality:  Gradual Duration:  1 week Timing:  Intermittent Progression:  Worsening Chronicity:  Recurrent Context: stress   Relieved by:  Nitroglycerin and aspirin Worsened by:  Nothing Ineffective treatments:  None tried Associated symptoms: shortness of breath   Associated symptoms: no abdominal pain, no back pain, no claudication, no cough, no diaphoresis, no fatigue, no fever, no lower extremity edema, no nausea and no palpitations   Risk factors: coronary artery disease and hypertension   Risk factors comment:  Copd      Past Medical History:  Diagnosis Date   Anemia    iron defficiency   Anginal pain (HCC)    Arthritis    "knees, feet, hands; joints" (05/27/2015)   Asthma    Atrial fibrillation or flutter    s/p RFCA 7/08;   s/p DCCV in past;   previously on amiodarone;  amio stopped due to  lung toxicity   CAD (coronary artery disease)    s/p NSTEMI tx with BMS to OM1 3/08;  cath 3/08: pOM 99% tx with PCI, pLAD 20%, ? mod stenosis at the AM   CAP (community acquired pneumonia) 05/24/2015   Chronic diastolic heart failure ([place])    echo 11/11:  EF 55-60%, severe LVH, mod LAE, mild MR, mildly increased PASP   COPD (chronic obstructive pulmonary disease) (HCC)    Degenerative joint disease    Dystrophy, corneal stromal    Heart murmur    HLD (hyperlipidemia)    HTN (hypertension)    essential nos   Hypopotassemia    PMH of   Muscle pain    Myocardial infarction ([place]) 2008   OSA on CPAP     Osteoporosis    Pneumonia 05/27/2015   Protein calorie malnutrition (HCC)    Rash and nonspecific skin eruption    both arms,awaiting bio   dr Delman Cheadle   Seasonal allergies    Shortness of breath dyspnea    Type II diabetes mellitus ([place])    Dr Loanne Drilling    Patient Active Problem List   Diagnosis Date Noted   PAD (peripheral artery disease) ([place]) 02/29/2020   Chronic kidney disease (CKD) 02/20/2020   Foot pain 02/20/2020   Difficulty sleeping 08/01/2019   Osteoarthritis 08/01/2019   Abdominal cramping 01/31/2019   Arthritis of left sacroiliac joint 05/04/2017   Osteopenia 02/17/2017   Piriformis syndrome of left side 02/16/2017   Acquired leg length discrepancy 02/16/2017   COPD GOLD I  01/27/2017   Lumbar radiculopathy, acute 01/27/2017   Granuloma annulare 07/27/2016   Numbness of fingers 07/27/2016   Primary osteoarthritis of right knee 06/27/2016   Rash and nonspecific skin eruption 06/09/2016   Diabetes ([place]) 03/07/2016   OSA  08/28/2014   Chronic diastolic heart failure ([place]) 07/30/2014   Nocturnal hypoxemia 07/09/2014   SOB (shortness of breath) 05/15/2014   Pulmonary HTN ([place]) 05/15/2014   Crescendo angina ([place]) 05/02/2014   Band keratopathy 01/16/2014   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   Hyperthyroidism 11/04/2011   Multinodular goiter 10/14/2011   Atrophy, Fuchs' 09/29/2011   ATRIAL FIBRILLATION 06/03/2010   CHF 02/17/2010   VITILIGO 11/07/2009   SINOATRIAL NODE DYSFUNCTION 06/16/2009   Atrial flutter ([place]) 06/25/2008   HYPERLIPIDEMIA 01/29/2008   Coronary atherosclerosis 01/29/2008   HTN (hypertension) 10/30/2007    Past Surgical History:  Procedure Laterality Date   A FLUTTER ABLATION     Dr Lovena Le   ABDOMINAL AORTOGRAM W/LOWER EXTREMITY Bilateral 04/11/2020   Procedure: ABDOMINAL AORTOGRAM W/LOWER EXTREMITY;  Surgeon: Angelia Mould, MD;  Location: [facility];  Service: Cardiovascular;   Laterality: Bilateral;   CATARACT EXTRACTION W/ INTRAOCULAR LENS  IMPLANT, BILATERAL Bilateral    CHOLECYSTECTOMY     COLONOSCOPY  6/07   2 polyps Dr. Kinnie Feil in [place]   colonoscopy with polypectomy      X 2; [facility]   CORNEAL TRANSPLANT Bilateral    CORONARY ANGIOPLASTY WITH STENT PLACEMENT  02/2007   BMS w/Dr Lovena Le   EYE SURGERY     gravid 2 para 2     KNEE CARTILAGE SURGERY Right    LEFT AND RIGHT HEART CATHETERIZATION WITH CORONARY ANGIOGRAM N/A 05/07/2014   Procedure: LEFT AND RIGHT HEART CATHETERIZATION WITH CORONARY ANGIOGRAM;  Surgeon: Peter M Martinique, MD;  Location: [facility];  Service: Cardiovascular;  Laterality: N/A;   PERIPHERAL VASCULAR INTERVENTION  04/11/2020   Procedure: PERIPHERAL VASCULAR  INTERVENTION;  Surgeon: Angelia Mould, MD;  Location: [facility];  Service: Cardiovascular;;   TOTAL KNEE ARTHROPLASTY Right 06/28/2016   Procedure: TOTAL KNEE ARTHROPLASTY;  Surgeon: Frederik Pear, MD;  Location: [facility];  Service: Orthopedics;  Laterality: Right;     OB History   No obstetric history on file.     Family History  Problem Relation Age of Onset   Diabetes Mother    Hypertension Mother    Transient ischemic attack Mother    Arthritis Mother    Heart attack Father 37   Arthritis Father    Hypertension Father    Breast cancer Maternal Aunt    Arthritis Maternal Aunt     Social History   Tobacco Use   Smoking status: Former Smoker    Packs/day: 0.25    Years: 18.00    Pack years: 4.50    Types: Cigarettes    Quit date: 12/27/1968    Years since quitting: 51.4   Smokeless tobacco: Never Used   Tobacco comment: smoked [place], up to 1 pp week  Substance Use Topics   Alcohol use: Yes    Comment: occ   Drug use: No    Home Medications Prior to Admission medications   Medication Sig Start Date End Date Taking? Authorizing Provider  acetaminophen (TYLENOL) 500 MG tablet Take 1,000 mg by mouth every 6 (six) hours  as needed (pain).    [provider]  CALCIUM-MAG-VIT C-VIT D PO Take 1 tablet by mouth daily.    [provider]  carvedilol (COREG) 6.25 MG tablet TAKE 1 TABLET BY MOUTH TWO  TIMES DAILY WITH MEALS 02/29/20   Bensimhon, Shaune Pascal, MD  cholecalciferol (VITAMIN D) 1000 units tablet Take 1,000 Units by mouth daily after supper.    [provider]  Cold Sore Products (L-LYSINE EX) Apply topically.    [provider]  ELIQUIS 5 MG TABS tablet TAKE 1 TABLET BY MOUTH  TWICE DAILY 11/19/19   Bensimhon, Shaune Pascal, MD  empagliflozin (JARDIANCE) 10 MG TABS tablet Take 10 mg by mouth daily before breakfast. 10/26/19   Bensimhon, Shaune Pascal, MD  Evolocumab (REPATHA SURECLICK) [id number] MG/ML SOAJ Inject 1 Dose into the skin every 14 (fourteen) days. 10/24/19   Hilty, Nadean Corwin, MD  ezetimibe (ZETIA) 10 MG tablet Take 1 tablet (10 mg total) by mouth daily. 01/30/20 04/29/20  Pixie Casino, MD  furosemide (LASIX) 80 MG tablet TAKE 1 TABLET BY MOUTH TWO  TIMES DAILY 02/29/20   Bensimhon, Shaune Pascal, MD  glucose blood (ONETOUCH VERIO) test strip Use to monitor glucose levels 2 times daily 08/07/19   Renato Shin, MD  insulin NPH-regular Human (NOVOLIN 70/30 RELION) (70-30) 100 UNIT/ML injection Inject 31 Units into the skin daily with breakfast. And syringes 1/day 05/06/20   Renato Shin, MD  Lancets (ONETOUCH DELICA PLUS [id number]) [place] USE TO MONITOR GLUCOSE  LEVELS TWICE DAILY 05/03/20   Renato Shin, MD  Multiple Vitamin (MULTIVITAMIN WITH MINERALS) TABS tablet Take 1 tablet by mouth daily. Centrum Silver    [provider]  nitroGLYCERIN (NITROSTAT) 0.4 MG SL tablet Place 1 tablet (0.4 mg total) under the tongue every 5 (five) minutes as needed for chest pain. Reported on 01/28/2016 01/03/20   Bensimhon, Shaune Pascal, MD  potassium chloride SA (KLOR-CON) 20 MEQ tablet TAKE 2 TABLETS BY MOUTH  DAILY 05/05/20   Bensimhon, Shaune Pascal, MD  tiotropium (SPIRIVA HANDIHALER) 18 MCG inhalation capsule  Place 1 capsule (  18 mcg total) into inhaler and inhale daily. 11/06/19   [name], MD    Allergies    Amlodipine besylate, Levaquin [levofloxacin], Lobster [shellfish allergy], Penicillins, Valsartan, Codeine, Lipitor [atorvastatin], Oxycodone, Statins, Tramadol, Crestor [rosuvastatin calcium], and Tramadol hcl  Review of Systems   Review of Systems  Constitutional: Negative for diaphoresis, fatigue and fever.  Respiratory: Positive for shortness of breath. Negative for cough.   Cardiovascular: Positive for chest pain. Negative for palpitations and claudication.  Gastrointestinal: Negative for abdominal pain and nausea.  Musculoskeletal: Negative for back pain.  All other systems reviewed and are negative.   Physical Exam Updated Vital Signs BP (!) 146/76    Pulse 79    Temp 98.4 F (36.9 C) (Oral)    Resp (!) 25    Ht 5\' 3"  (1.6 m)    Wt 74.8 kg    SpO2 97%    BMI 29.23 kg/m   Physical Exam Vitals and nursing note reviewed.  Constitutional:      General: She is not in acute distress.    Appearance: She is well-developed.  HENT:     Head: Normocephalic and atraumatic.  Eyes:     Conjunctiva/sclera: Conjunctivae normal.     Pupils: Pupils are equal, round, and reactive to light.  Cardiovascular:     Rate and Rhythm: Normal rate and regular rhythm.     Heart sounds: No murmur.  Pulmonary:     Effort: Pulmonary effort is normal. No respiratory distress.     Breath sounds: Examination of the right-lower field reveals rales. Rales present. No wheezing.  Abdominal:     General: There is no distension.     Palpations: Abdomen is soft.     Tenderness: There is no abdominal tenderness. There is no guarding or rebound.  Musculoskeletal:        General: No tenderness. Normal range of motion.     Cervical back: Normal range of motion and neck supple.     Right lower leg: Edema present.     Left lower leg: Edema present.     Comments: Trace edema in bilateral ankles  Skin:     General: Skin is warm and dry.     Findings: No erythema or rash.  Neurological:     General: No focal deficit present.     Mental Status: She is alert and oriented to person, place, and time. Mental status is at baseline.  Psychiatric:        Mood and Affect: Mood normal.        Behavior: Behavior normal.        Thought Content: Thought content normal.      ED Results / Procedures / Treatments   Labs (all labs ordered are listed, but only abnormal results are displayed) Labs Reviewed  BASIC METABOLIC PANEL - Abnormal; Notable for the following components:      Result Value   Chloride 97 (*)    Glucose, Bld 271 (*)    Creatinine, Ser 1.09 (*)    GFR calc non Af Amer 46 (*)    GFR calc Af Amer 53 (*)    All other components within normal limits  CBC - Abnormal; Notable for the following components:   RBC 5.34 (*)    Hemoglobin 16.3 (*)    HCT 52.5 (*)    All other components within normal limits  CBG MONITORING, ED - Abnormal; Notable for the following components:   Glucose-Capillary 204 (*)  All other components within normal limits  TROPONIN I (HIGH SENSITIVITY) - Abnormal; Notable for the following components:   Troponin I (High Sensitivity) 191 (*)    All other components within normal limits  BRAIN NATRIURETIC PEPTIDE  TROPONIN I (HIGH SENSITIVITY)    EKG EKG Interpretation  Date/Time:  Thursday May 15 2020 11:55:46 EDT Ventricular Rate:  82 PR Interval:  294 QRS Duration: 124 QT Interval:  368 QTC Calculation: 429 R Axis:   -89 Text Interpretation: Sinus rhythm with sinus arrhythmia with 1st degree A-V block Left axis deviation Left ventricular hypertrophy with QRS widening and repolarization abnormality ( Cornell product ) No significant change since last tracing Confirmed by Blanchie Dessert [phone]) on 05/15/2020 12:17:19 PM   Radiology DG Chest 2 View  Result Date: 05/15/2020 CLINICAL DATA:  Chest pain 1 week. EXAM: CHEST - 2 VIEW COMPARISON:   02/01/2018 FINDINGS: Lungs are adequately inflated without airspace consolidation or effusion. Cardiomediastinal silhouette and remainder of the exam is unchanged. IMPRESSION: No active cardiopulmonary disease. Electronically Signed   By: Marin Olp M.D.   On: 05/15/2020 12:53    Procedures Procedures (including critical care time)  Medications Ordered in ED Medications  sodium chloride flush (NS) 0.9 % injection 3 mL (0 mLs Intravenous Hold 05/15/20 1234)    ED Course  I have reviewed the triage vital signs and the nursing notes.  Pertinent labs & imaging results that were available during my care of the patient were reviewed by me and considered in my medical decision making (see chart for details).    MDM Rules/Calculators/A&P                      Elderly female with multiple medical problems presenting today with chest pain and symptoms concerning for angina.  Patient has been taking multiple nitroglycerin per day for the last 1 week and reports the pain would usually be improved however today it was not.  Patient did receive 325 mg of aspirin and nitroglycerin in route and is currently chest pain-free.  Patient's EKG being chest pain-free is unchanged from prior.  She is in no acute distress at this time.  Low suspicion for an acute abdominal or pulmonary process.  Patient continues to take her Lasix and her Eliquis.  She has had no recent medication changes.  Patient does not look overtly fluid overloaded.  Vital signs are reassuring.  CBC and BMP without new changes, troponin is elevated at 191 without old to compare.  Chest x-ray without acute disease process.  Given patient's story, risk factors spoke with cardiology who will come and evaluate the patient.  She currently remains chest pain-free.  MDM Number of Diagnoses or Management Options   Amount and/or Complexity of Data Reviewed Clinical lab tests: ordered and reviewed Tests in the radiology section of CPT: ordered and  reviewed Tests in the medicine section of CPT: ordered and reviewed Decide to obtain previous medical records or to obtain history from someone other than the patient: yes Obtain history from someone other than the patient: no Review and summarize past medical records: yes Discuss the patient with other providers: yes Independent visualization of images, tracings, or specimens: yes  Risk of Complications, Morbidity, and/or Mortality Presenting problems: high Diagnostic procedures: low Management options: low  Patient Progress Patient progress: improved  Final Clinical Impression(s) / ED Diagnoses Final diagnoses:  Unstable angina ([place])    Rx / DC Orders ED Discharge Orders    None  Blanchie Dessert, MD 05/15/20 1354

## 2020-05-15 NOTE — H&P (Addendum)
Cardiology Admission History and Physical:   Patient ID: [name] MRN: [id number]; DOB: [birth year]   Admission date: 05/15/2020  Primary Care Provider: [name], MD Primary Cardiologist: No primary care provider on file. Primary HF Dr. Haroldine Laws  Primary Electrophysiologist:  None   Chief Complaint:  Chest pain   Patient Profile:   [name] is a 86 y.o. female with hx of CAD with 1 vessel stent-BMS 2008, HTN, a fib/flutter, chronic prior systolic HF, and diastolic HF, COPD, OSA no longer wears CPAP, PAD with recent PTA and stent of Rt external iliac artery by Dr. Scot Dock.      History of Present Illness:   [name] with above hx and last cath 2015 with single vessel obstructive CAD with moderate disease in prox and mRCA, with stent in 1st OM widely patent.  Severe pulmonary HTN,  Nuuc study 09/25/19 with very mild ischemia mid inferolateral area, that may be artifact;  Echo 10/31/17 with EF 65-70% G3DD,  presented to ER today by EMS, with chest pain.  NTG will relieve but it returns.  Has been episodic for about a week.   Occurs with rest and exertion.  Some SOB associated but no radiation or diaphoresis with the pain.  She has been having diaphoresis at night recently.    Has had increased stress, her son diagnosed with cancer.   EKG:  The ECG that was done 05/15/20 was personally reviewed and demonstrates SR with 1st degree AV block PR 294 ms T wave inversions V4-5  Troponin 191, Na 137, K+ 4.5 glucose 271, Cr 1.09  Hgb 16.3, Hct 52.5 WBC 4.5 and plts 191  2V CXR No active cardiopulmonary disease  Currently resting in bed no chest pain and no SOB.  Tele with SR 1st degree aV block that appears to lengthen and then Mobitz I  But not frequently.  Her last dose of eliquis was this AM.      Past Medical History:  Diagnosis Date  . Anemia    iron defficiency  . Anginal pain ([place])   . Arthritis    "knees, feet, hands; joints" (05/27/2015)  . Asthma   . Atrial  fibrillation or flutter    s/p RFCA 7/08;   s/p DCCV in past;   previously on amiodarone;  amio stopped due to lung toxicity  . CAD (coronary artery disease)    s/p NSTEMI tx with BMS to OM1 3/08;  cath 3/08: pOM 99% tx with PCI, pLAD 20%, ? mod stenosis at the AM  . CAP (community acquired pneumonia) 05/24/2015  . Chronic diastolic heart failure (HCC)    echo 11/11:  EF 55-60%, severe LVH, mod LAE, mild MR, mildly increased PASP  . COPD (chronic obstructive pulmonary disease) ([place])   . Degenerative joint disease   . Dystrophy, corneal stromal   . Heart murmur   . HLD (hyperlipidemia)   . HTN (hypertension)    essential nos  . Hypopotassemia    PMH of  . Muscle pain   . Myocardial infarction ([place]) 2008  . OSA on CPAP   . Osteoporosis   . Pneumonia 05/27/2015  . Protein calorie malnutrition ([place])   . Rash and nonspecific skin eruption    both arms,awaiting bio   dr Delman Cheadle  . Seasonal allergies   . Shortness of breath dyspnea   . Type II diabetes mellitus ([place])    Dr Loanne Drilling    Past Surgical History:  Procedure Laterality Date  .  A FLUTTER ABLATION     Dr Lovena Le  . ABDOMINAL AORTOGRAM W/LOWER EXTREMITY Bilateral 04/11/2020   Procedure: ABDOMINAL AORTOGRAM W/LOWER EXTREMITY;  Surgeon: Angelia Mould, MD;  Location: [facility];  Service: Cardiovascular;  Laterality: Bilateral;  . CATARACT EXTRACTION W/ INTRAOCULAR LENS  IMPLANT, BILATERAL Bilateral   . CHOLECYSTECTOMY    . COLONOSCOPY  6/07   2 polyps Dr. Kinnie Feil in [place]  . colonoscopy with polypectomy      X 2; [facility]  . CORNEAL TRANSPLANT Bilateral   . CORONARY ANGIOPLASTY WITH STENT PLACEMENT  02/2007   BMS w/Dr Lovena Le  . EYE SURGERY    . gravid 2 para 2    . KNEE CARTILAGE SURGERY Right   . LEFT AND RIGHT HEART CATHETERIZATION WITH CORONARY ANGIOGRAM N/A 05/07/2014   Procedure: LEFT AND RIGHT HEART CATHETERIZATION WITH CORONARY ANGIOGRAM;  Surgeon: Peter M Martinique, MD;  Location: [facility];  Service:  Cardiovascular;  Laterality: N/A;  . PERIPHERAL VASCULAR INTERVENTION  04/11/2020   Procedure: PERIPHERAL VASCULAR INTERVENTION;  Surgeon: Angelia Mould, MD;  Location: [facility];  Service: Cardiovascular;;  . TOTAL KNEE ARTHROPLASTY Right 06/28/2016   Procedure: TOTAL KNEE ARTHROPLASTY;  Surgeon: Frederik Pear, MD;  Location: [facility];  Service: Orthopedics;  Laterality: Right;     Medications Prior to Admission: Prior to Admission medications   Medication Sig Start Date End Date Taking? Authorizing Provider  acetaminophen (TYLENOL) 500 MG tablet Take 1,000 mg by mouth every 6 (six) hours as needed (pain).    [provider]  CALCIUM-MAG-VIT C-VIT D PO Take 1 tablet by mouth daily.    [provider]  carvedilol (COREG) 6.25 MG tablet TAKE 1 TABLET BY MOUTH TWO  TIMES DAILY WITH MEALS 02/29/20   Bensimhon, Shaune Pascal, MD  cholecalciferol (VITAMIN D) 1000 units tablet Take 1,000 Units by mouth daily after supper.    [provider]  Cold Sore Products (L-LYSINE EX) Apply topically.    [provider]  ELIQUIS 5 MG TABS tablet TAKE 1 TABLET BY MOUTH  TWICE DAILY 11/19/19   Bensimhon, Shaune Pascal, MD  empagliflozin (JARDIANCE) 10 MG TABS tablet Take 10 mg by mouth daily before breakfast. 10/26/19   Bensimhon, Shaune Pascal, MD  Evolocumab (REPATHA SURECLICK) [id number] MG/ML SOAJ Inject 1 Dose into the skin every 14 (fourteen) days. 10/24/19   Hilty, Nadean Corwin, MD  ezetimibe (ZETIA) 10 MG tablet Take 1 tablet (10 mg total) by mouth daily. 01/30/20 04/29/20  Pixie Casino, MD  furosemide (LASIX) 80 MG tablet TAKE 1 TABLET BY MOUTH TWO  TIMES DAILY 02/29/20   Bensimhon, Shaune Pascal, MD  glucose blood (ONETOUCH VERIO) test strip Use to monitor glucose levels 2 times daily 08/07/19   Renato Shin, MD  insulin NPH-regular Human (NOVOLIN 70/30 RELION) (70-30) 100 UNIT/ML injection Inject 31 Units into the skin daily with breakfast. And syringes 1/day 05/06/20   Renato Shin, MD    Lancets (ONETOUCH DELICA PLUS [id number]) [place] USE TO MONITOR GLUCOSE  LEVELS TWICE DAILY 05/03/20   Renato Shin, MD  Multiple Vitamin (MULTIVITAMIN WITH MINERALS) TABS tablet Take 1 tablet by mouth daily. Centrum Silver    [provider]  nitroGLYCERIN (NITROSTAT) 0.4 MG SL tablet Place 1 tablet (0.4 mg total) under the tongue every 5 (five) minutes as needed for chest pain. Reported on 01/28/2016 01/03/20   Bensimhon, Shaune Pascal, MD  potassium chloride SA (KLOR-CON) 20 MEQ tablet TAKE 2 TABLETS BY  MOUTH  DAILY 05/05/20   Bensimhon, Shaune Pascal, MD  tiotropium (SPIRIVA HANDIHALER) 18 MCG inhalation capsule Place 1 capsule (18 mcg total) into inhaler and inhale daily. 11/06/19   [name], MD   she is no longer on jardiance.   Allergies:    Allergies  Allergen Reactions  . Amlodipine Besylate Other (See Comments)    REACTION: tingling in lips & gum edema  . Levaquin [Levofloxacin] Shortness Of Breath and Swelling    angioedema  . Lobster [Shellfish Allergy] Other (See Comments)    angioedema  . Penicillins Rash    Has patient had a PCN reaction causing immediate rash, facial/tongue/throat swelling, SOB or lightheadedness with hypotension: Yes Has patient had a PCN reaction causing severe rash involving mucus membranes or skin necrosis: No Has patient had a PCN reaction that required hospitalization: No Has patient had a PCN reaction occurring within the last 10 years: No If all of the above answers are "NO", then may proceed with Cephalosporin use.  . Valsartan Other (See Comments)    REACTION: angioedema  . Codeine Other (See Comments)    Mental status changes  . Lipitor [Atorvastatin] Other (See Comments)    weakness  . Oxycodone Other (See Comments)    hallucinations  . Statins     Made her too weak  . Tramadol   . Crestor [Rosuvastatin Calcium] Rash  . Tramadol Hcl Nausea And Vomiting    Social History:   Social History   Socioeconomic History  . Marital status:  Widowed    Spouse name: Not on file  . Number of children: 2  . Years of education: Not on file  . Highest education level: Not on file  Occupational History  . Occupation: Recruitment consultant Retired  Tobacco Use  . Smoking status: Former Smoker    Packs/day: 0.25    Years: 18.00    Pack years: 4.50    Types: Cigarettes    Quit date: 12/27/1968    Years since quitting: 51.4  . Smokeless tobacco: Never Used  . Tobacco comment: smoked [place], up to 1 pp week  Substance and Sexual Activity  . Alcohol use: Yes    Comment: occ  . Drug use: No  . Sexual activity: Never  Other Topics Concern  . Not on file  Social History Narrative   Teacher. Widowed. Rarely drinks cafeine.    Social Determinants of Health   Financial Resource Strain:   . Difficulty of Paying Living Expenses:   Food Insecurity:   . Worried About Charity fundraiser in the Last Year:   . Arboriculturist in the Last Year:   Transportation Needs:   . Film/video editor (Medical):   Marland Kitchen Lack of Transportation (Non-Medical):   Physical Activity: Insufficiently Active  . Days of Exercise per Week: 4 days  . Minutes of Exercise per Session: 30 min  Stress: No Stress Concern Present  . Feeling of Stress : Only a little  Social Connections: Slightly Isolated  . Frequency of Communication with Friends and Family: More than three times a week  . Frequency of Social Gatherings with Friends and Family: More than three times a week  . Attends Religious Services: 1 to 4 times per year  . Active Member of Clubs or Organizations: Yes  . Attends Archivist Meetings: 1 to 4 times per year  . Marital Status: Widowed  Intimate Partner Violence:   . Fear of Current or Ex-Partner:   .  Emotionally Abused:   Marland Kitchen Physically Abused:   . Sexually Abused:     Family History:  The patient's family history includes Arthritis in her father, maternal aunt, and mother; Breast cancer in her maternal aunt; Diabetes in her mother;  Heart attack (age of onset: 62) in her father; Hypertension in her father and mother; Transient ischemic attack in her mother.  Now son with cancer  ROS:  Please see the history of present illness.  General:no colds or fevers, no weight changes Skin:no rashes or ulcers HEENT:no blurred vision, no congestion CV:see HPI PUL:see HPI GI:no diarrhea constipation or melena, no indigestion GU:no hematuria, no dysuria MS:no joint pain, no claudication Neuro:no syncope, no lightheadedness Endo:+ diabetes no longer on jardiance, no thyroid disease  All other ROS reviewed and negative.     Physical Exam/Data:   Vitals:   05/15/20 1217 05/15/20 1230 05/15/20 1300 05/15/20 1330  BP:  (!) 141/72 (!) 146/76 (!) 166/70  Pulse: (!) 59 62 79 85  Resp: (!) 21 (!) 24 (!) 25 (!) 24  Temp:      TempSrc:      SpO2: 97% 96% 97% 97%  Weight:      Height:       No intake or output data in the 24 hours ending 05/15/20 1423 Last 3 Weights 05/15/2020 05/06/2020 04/30/2020  Weight (lbs) 165 lb 166 lb 171 lb  Weight (kg) 74.844 kg 75.297 kg 77.565 kg     Body mass index is 29.23 kg/m.  General:  Well nourished, well developed, in no acute distress HEENT: normal Lymph: no adenopathy Neck: no JVD Endocrine:  No thryomegaly Vascular: No carotid bruits; radial pulses 2+ bilaterally  Cardiac:  normal S1, S2; RRR; no murmur gallup rub or click Lungs:  clear to auscultation bilaterally, no wheezing, rhonchi or rales  Abd: soft, nontender, no hepatomegaly  Ext: no lower ext edema Musculoskeletal:  No deformities, BUE and BLE strength normal and equal Skin: warm and dry  Neuro:  Alert and oriented X 3 MAE follows commands , no focal abnormalities noted Psych:  Normal affect     Relevant CV Studies: Myoview 9/20   Nuclear stress EF: 56%.  There was no ST segment deviation noted during stress.  Defect 1: There is a medium defect of moderate severity present in the mid inferolateral, apical inferior  and apical lateral location.  Findings consistent with possible mild ischemia in the apical inferior and inferolateral regions.  The left ventricular ejection fraction is normal (55-65%).  cath 05/07/14  RA 6 RV 71/7 6 PA 63/17 (33)  PCWP 19 mm Hg  LV 174/16 mm Hg  AO 171/62 mean 103 mm Hg  PVR = 2.86 Oxygen saturations:  PA 69%  AO 99%  Cardiac Output/Index (Fick) 4.88/2.7   Left mainstem: Normal.  Left anterior descending (LAD): Moderate calcification proximally. Mild irregularities less than 10%. The first diagonal has 40-50% ostial disease.  Left circumflex (LCx): The LCx gives rise to a large OM1 then terminates in the AV groove. There is 20% disease in the proximal LCx. The first OM stent is patent with diffuse 20% disease.  Right coronary artery (RCA): The RCA arises anteriorly. There is an eccentric slit like stenosis in the proximal vessel to 60-70%. The mid vessel has segmental 70-80% disease.  Left ventriculography: Left ventricular systolic function is normal, LVEF is estimated at 55-65%, there is no significant mitral regurgitation     Laboratory Data:  High Sensitivity Troponin:  Recent Labs  Lab 05/15/20 1202  TROPONINIHS 191*      Chemistry Recent Labs  Lab 05/15/20 1202  NA 137  K 4.5  CL 97*  CO2 29  GLUCOSE 271*  BUN 22  CREATININE 1.09*  CALCIUM 10.1  GFRNONAA 46*  GFRAA 53*  ANIONGAP 11    No results for input(s): PROT, ALBUMIN, AST, ALT, ALKPHOS, BILITOT in the last 168 hours. Hematology Recent Labs  Lab 05/15/20 1202  WBC 4.5  RBC 5.34*  HGB 16.3*  HCT 52.5*  MCV 98.3  MCH 30.5  MCHC 31.0  RDW 13.7  PLT 191   BNPNo results for input(s): BNP, PROBNP in the last 168 hours.  DDimer No results for input(s): DDIMER in the last 168 hours.   Radiology/Studies:  DG Chest 2 View  Result Date: 05/15/2020 CLINICAL DATA:  Chest pain 1 week. EXAM: CHEST - 2 VIEW COMPARISON:  02/01/2018 FINDINGS: Lungs are adequately inflated  without airspace consolidation or effusion. Cardiomediastinal silhouette and remainder of the exam is unchanged. IMPRESSION: No active cardiopulmonary disease. Electronically Signed   By: Marin Olp M.D.   On: 05/15/2020 12:53       HEAR Score (for undifferentiated chest pain):  HEAR Score: 6    Assessment and Plan:   1. Unstable angina with known CAD and prior stent BMS to 1st OM - new angina, different from chronic and is relieved with NTG.  But pain returns.  Admit to progressive and hold eliqiis, place on IV heparin, prob plan cath for tomorrow off eliquis ? Tomorrow afternoon  Troponin 191 now 626.  2. CAD with BMS in 2008 last cath 2015 with moderate disease. Mild ischemia on myoview 08/2019 now with angina 3. Pulmonary HTN on laix 80 mg daily -hold in AM  4. P a fib /flutter in SR  On eliquis will hold for cardiac cath 5. HLD on repatha followed by Dr. Debara Pickett lipid clinic  Last lipids 01/2020 Tchol 176, TG 132, HDL 53, LDL 96  Her Repatha is due Sunday 6. HTN on coreg 6.25 BID  7. DM2 on jardiance could only tolerate 5 mg daily. Last A1c 01/2020 9.2  On insulin  8. COPD has spiriva inhaler  9. possible Mobitz 1 monitor on tele  Severity of Illness: The appropriate patient status for this patient is INPATIENT. Inpatient status is judged to be reasonable and necessary in order to provide the required intensity of service to ensure the patient's safety. The patient's presenting symptoms, physical exam findings, and initial radiographic and laboratory data in the context of their chronic comorbidities is felt to place them at high risk for further clinical deterioration. Furthermore, it is not anticipated that the patient will be medically stable for discharge from the hospital within 2 midnights of admission. The following factors support the patient status of inpatient.   " The patient's presenting symptoms include freq episodes of chest pain relief with NTG in pt with known CAD. " The  worrisome physical exam findings include none. " The initial radiographic and laboratory data are worrisome because of elevated troponin . " The chronic co-morbidities include HTN, DM, HLD .   * I certify that at the point of admission it is my clinical judgment that the patient will require inpatient hospital care spanning beyond 2 midnights from the point of admission due to high intensity of service, high risk for further deterioration and high frequency of surveillance required.*    For questions or updates, please contact  [facility] Please consult [email or web address] for contact info under        Signed, Cecilie Kicks, NP  05/15/2020 2:23 PM    Attending Note:   The patient was seen and examined.  Agree with assessment and plan as noted above.  Changes made to the above note as needed.  Patient seen and independently examined with  Cecilie Kicks, NP .   We discussed all aspects of the encounter. I agree with the assessment and plan as stated above.  1.   Unstable angina : Patient presents with symptoms consistent with unstable angina.  She is had progressive chest pain that has worsened over the past several weeks.  She typically would take a nitroglycerin every 3 months or so but the past week or so she has been taking nitroglycerin 2 and 3 times a day.  Her pain is described as a midsternal chest pressure.  It is relieved with nitroglycerin but the pain typically returns approximately 30 minutes later.  It is associated with some shortness of breath.  She presents to the emergency room.  Her troponin levels are elevated with the most recent troponin level of 626. Glucose is 271.  EKG shows sinus rhythm with first-degree AV block.  She has ST segment depression with T wave inversions in the inferior and anterolateral leads.  These EKG changes are unchanged from her previous EKGs.  I think her best option is to proceed with heart catheterization tomorrow.  We will start her on IV  heparin and IV nitroglycerin tonight.  I discussed heart catheterization as well as possible PCI with her.  We have discussed the risks, benefits, options.  She understands and agrees to proceed.  2.  Paroxysmal atrial fibrillation/atrial flutter: We will hold her Eliquis for the heart catheterization tomorrow.  She will be on IV heparin.  3.  Pulmonary hypertension: Continue Lasix.   I have spent a total of 40 minutes with patient reviewing hospital  notes , telemetry, EKGs, labs and examining patient as well as establishing an assessment and plan that was discussed with the patient. > 50% of time was spent in direct patient care.    Thayer Headings, Brooke Bonito., MD, [name] 05/15/2020, 4:56 PM [address]  [place] Pager [phone]

## 2020-05-15 NOTE — ED Notes (Signed)
Date and time results received: 05/15/20 1311 (use smartphrase ".now" to insert current time)  Test: troponin  Critical Value: 191  Name of Provider Notified: Dr. Maryan Rued  Orders Received? Or Actions Taken?: see MAR

## 2020-05-16 ENCOUNTER — Encounter (HOSPITAL_COMMUNITY)
Admission: EM | Disposition: A | Discharge status: Home / Self Care | Payer: Self-pay | Source: Home / Self Care | Attending: Internal Medicine

## 2020-05-16 ENCOUNTER — Inpatient Hospital Stay (HOSPITAL_COMMUNITY): Payer: Medicare Other

## 2020-05-16 DIAGNOSIS — I5032 Chronic diastolic (congestive) heart failure: Secondary | ICD-10-CM

## 2020-05-16 DIAGNOSIS — I214 Non-ST elevation (NSTEMI) myocardial infarction: Principal | ICD-10-CM

## 2020-05-16 DIAGNOSIS — I340 Nonrheumatic mitral (valve) insufficiency: Secondary | ICD-10-CM

## 2020-05-16 DIAGNOSIS — I480 Paroxysmal atrial fibrillation: Secondary | ICD-10-CM

## 2020-05-16 DIAGNOSIS — I2721 Secondary pulmonary arterial hypertension: Secondary | ICD-10-CM

## 2020-05-16 HISTORY — PX: CORONARY BALLOON ANGIOPLASTY: CATH118233

## 2020-05-16 HISTORY — PX: CORONARY ATHERECTOMY: CATH118238

## 2020-05-16 HISTORY — PX: CORONARY STENT INTERVENTION: CATH118234

## 2020-05-16 HISTORY — PX: CORONARY ULTRASOUND/IVUS: CATH118244

## 2020-05-16 HISTORY — PX: RIGHT/LEFT HEART CATH AND CORONARY ANGIOGRAPHY: CATH118266

## 2020-05-16 LAB — CBC
HCT: 48.3 % — ABNORMAL HIGH (ref 36.0–46.0)
Hemoglobin: 15.2 g/dL — ABNORMAL HIGH (ref 12.0–15.0)
MCH: 30.6 pg (ref 26.0–34.0)
MCHC: 31.5 g/dL (ref 30.0–36.0)
MCV: 97.2 fL (ref 80.0–100.0)
Platelets: 176 10*3/uL (ref 150–400)
RBC: 4.97 MIL/uL (ref 3.87–5.11)
RDW: 13.7 % (ref 11.5–15.5)
WBC: 4.7 10*3/uL (ref 4.0–10.5)
nRBC: 0 % (ref 0.0–0.2)

## 2020-05-16 LAB — POCT I-STAT EG7
Acid-Base Excess: 3 mmol/L — ABNORMAL HIGH (ref 0.0–2.0)
Acid-base deficit: 2 mmol/L (ref 0.0–2.0)
Bicarbonate: 24.1 mmol/L (ref 20.0–28.0)
Bicarbonate: 31.2 mmol/L — ABNORMAL HIGH (ref 20.0–28.0)
Calcium, Ion: 1.11 mmol/L — ABNORMAL LOW (ref 1.15–1.40)
Calcium, Ion: 1.33 mmol/L (ref 1.15–1.40)
HCT: 39 % (ref 36.0–46.0)
HCT: 48 % — ABNORMAL HIGH (ref 36.0–46.0)
Hemoglobin: 13.3 g/dL (ref 12.0–15.0)
Hemoglobin: 16.3 g/dL — ABNORMAL HIGH (ref 12.0–15.0)
O2 Saturation: 67 %
O2 Saturation: 73 %
Potassium: 3.1 mmol/L — ABNORMAL LOW (ref 3.5–5.1)
Potassium: 4.6 mmol/L (ref 3.5–5.1)
Sodium: 141 mmol/L (ref 135–145)
Sodium: 151 mmol/L — ABNORMAL HIGH (ref 135–145)
TCO2: 26 mmol/L (ref 22–32)
TCO2: 33 mmol/L — ABNORMAL HIGH (ref 22–32)
pCO2, Ven: 45.3 mmHg (ref 44.0–60.0)
pCO2, Ven: 62.1 mmHg — ABNORMAL HIGH (ref 44.0–60.0)
pH, Ven: 7.31 (ref 7.250–7.430)
pH, Ven: 7.335 (ref 7.250–7.430)
pO2, Ven: 39 mmHg (ref 32.0–45.0)
pO2, Ven: 42 mmHg (ref 32.0–45.0)

## 2020-05-16 LAB — BASIC METABOLIC PANEL
Anion gap: 11 (ref 5–15)
BUN: 23 mg/dL (ref 8–23)
CO2: 28 mmol/L (ref 22–32)
Calcium: 9.6 mg/dL (ref 8.9–10.3)
Chloride: 101 mmol/L (ref 98–111)
Creatinine, Ser: 1.03 mg/dL — ABNORMAL HIGH (ref 0.44–1.00)
GFR calc Af Amer: 57 mL/min — ABNORMAL LOW (ref >60)
GFR calc non Af Amer: 49 mL/min — ABNORMAL LOW (ref >60)
Glucose, Bld: 160 mg/dL — ABNORMAL HIGH (ref 70–99)
Potassium: 4.2 mmol/L (ref 3.5–5.1)
Sodium: 140 mmol/L (ref 135–145)

## 2020-05-16 LAB — POCT I-STAT 7, (LYTES, BLD GAS, ICA,H+H)
Acid-Base Excess: 4 mmol/L — ABNORMAL HIGH (ref 0.0–2.0)
Bicarbonate: 31.8 mmol/L — ABNORMAL HIGH (ref 20.0–28.0)
Calcium, Ion: 1.26 mmol/L (ref 1.15–1.40)
HCT: 47 % — ABNORMAL HIGH (ref 36.0–46.0)
Hemoglobin: 16 g/dL — ABNORMAL HIGH (ref 12.0–15.0)
O2 Saturation: 99 %
Potassium: 4.5 mmol/L (ref 3.5–5.1)
Sodium: 141 mmol/L (ref 135–145)
TCO2: 34 mmol/L — ABNORMAL HIGH (ref 22–32)
pCO2 arterial: 57.8 mmHg — ABNORMAL HIGH (ref 32.0–48.0)
pH, Arterial: 7.348 — ABNORMAL LOW (ref 7.350–7.450)
pO2, Arterial: 168 mmHg — ABNORMAL HIGH (ref 83.0–108.0)

## 2020-05-16 LAB — LIPID PANEL
Cholesterol: 164 mg/dL (ref 0–200)
HDL: 50 mg/dL (ref >40)
LDL Cholesterol: 96 mg/dL (ref 0–99)
Total CHOL/HDL Ratio: 3.3 RATIO
Triglycerides: 90 mg/dL (ref <150)
VLDL: 18 mg/dL (ref 0–40)

## 2020-05-16 LAB — HEPARIN LEVEL (UNFRACTIONATED): Heparin Unfractionated: >2.2 IU/mL — ABNORMAL HIGH (ref 0.30–0.70)

## 2020-05-16 LAB — ECHOCARDIOGRAM COMPLETE
Height: 63 in
Weight: 2681.6 oz

## 2020-05-16 LAB — APTT
aPTT: 119 seconds — ABNORMAL HIGH (ref 24–36)
aPTT: >200 seconds (ref 24–36)

## 2020-05-16 LAB — POCT ACTIVATED CLOTTING TIME
Activated Clotting Time: 257 seconds
Activated Clotting Time: 521 seconds
Activated Clotting Time: 417 seconds
Activated Clotting Time: 191 seconds

## 2020-05-16 LAB — TROPONIN I (HIGH SENSITIVITY): Troponin I (High Sensitivity): 1455 ng/L (ref <18)

## 2020-05-16 LAB — GLUCOSE, CAPILLARY
Glucose-Capillary: 139 mg/dL — ABNORMAL HIGH (ref 70–99)
Glucose-Capillary: 129 mg/dL — ABNORMAL HIGH (ref 70–99)
Glucose-Capillary: 222 mg/dL — ABNORMAL HIGH (ref 70–99)
Glucose-Capillary: 247 mg/dL — ABNORMAL HIGH (ref 70–99)

## 2020-05-16 SURGERY — RIGHT/LEFT HEART CATH AND CORONARY ANGIOGRAPHY
Anesthesia: LOCAL

## 2020-05-16 MED ORDER — MIDAZOLAM HCL 2 MG/2ML IJ SOLN
INTRAMUSCULAR | Status: AC
  Filled 2020-05-16: qty 2

## 2020-05-16 MED ORDER — HEPARIN (PORCINE) IN NACL 1000-0.9 UT/500ML-% IV SOLN
INTRAVENOUS | Status: AC
  Filled 2020-05-16: qty 1000

## 2020-05-16 MED ORDER — MIDAZOLAM HCL 2 MG/2ML IJ SOLN
INTRAMUSCULAR | Status: DC | PRN
  Administered 2020-05-16: 1 mg via INTRAVENOUS

## 2020-05-16 MED ORDER — HEPARIN SODIUM (PORCINE) 1000 UNIT/ML IJ SOLN
INTRAMUSCULAR | Status: DC | PRN
  Administered 2020-05-16: 3500 [IU] via INTRAVENOUS

## 2020-05-16 MED ORDER — SODIUM CHLORIDE 0.9% FLUSH: 3.0000 mL | INTRAVENOUS | Status: DC | PRN

## 2020-05-16 MED ORDER — VERAPAMIL HCL 2.5 MG/ML IV SOLN
INTRAVENOUS | Status: DC | PRN
  Administered 2020-05-16: 10 mL via INTRA_ARTERIAL

## 2020-05-16 MED ORDER — CLOPIDOGREL BISULFATE 300 MG PO TABS
ORAL_TABLET | ORAL | Status: DC | PRN
  Administered 2020-05-16: 600 mg via ORAL

## 2020-05-16 MED ORDER — CLOPIDOGREL BISULFATE 75 MG PO TABS
75.0000 mg | ORAL_TABLET | Freq: Every day | ORAL | Status: DC
  Administered 2020-05-17: 75 mg via ORAL
  Filled 2020-05-16: qty 1

## 2020-05-16 MED ORDER — HEPARIN SODIUM (PORCINE) 1000 UNIT/ML IJ SOLN
INTRAMUSCULAR | Status: AC
  Filled 2020-05-16: qty 1

## 2020-05-16 MED ORDER — FENTANYL CITRATE (PF) 100 MCG/2ML IJ SOLN
INTRAMUSCULAR | Status: DC | PRN
  Administered 2020-05-16: 25 ug via INTRAVENOUS

## 2020-05-16 MED ORDER — NITROGLYCERIN 1 MG/10 ML FOR IR/CATH LAB
INTRA_ARTERIAL | Status: DC | PRN
  Administered 2020-05-16 (×2): 200 ug via INTRACORONARY

## 2020-05-16 MED ORDER — HYDRALAZINE HCL 20 MG/ML IJ SOLN
10.0000 mg | INTRAMUSCULAR | Status: AC | PRN
  Administered 2020-05-16: 10 mg via INTRAVENOUS

## 2020-05-16 MED ORDER — ONDANSETRON HCL 4 MG/2ML IJ SOLN
INTRAMUSCULAR | Status: DC | PRN
  Administered 2020-05-16: 4 mg via INTRAVENOUS

## 2020-05-16 MED ORDER — LIDOCAINE HCL (PF) 1 % IJ SOLN
INTRAMUSCULAR | Status: AC
  Filled 2020-05-16: qty 30

## 2020-05-16 MED ORDER — SODIUM CHLORIDE 0.9 % IV SOLN: 250.0000 mL | INTRAVENOUS | Status: DC | PRN

## 2020-05-16 MED ORDER — VIPERSLIDE LUBRICANT OPTIME
TOPICAL | Status: DC | PRN
  Administered 2020-05-16: 500 mL via SURGICAL_CAVITY

## 2020-05-16 MED ORDER — VERAPAMIL HCL 2.5 MG/ML IV SOLN
INTRAVENOUS | Status: AC
  Filled 2020-05-16: qty 2

## 2020-05-16 MED ORDER — NITROGLYCERIN 1 MG/10 ML FOR IR/CATH LAB
INTRA_ARTERIAL | Status: AC
  Filled 2020-05-16: qty 10

## 2020-05-16 MED ORDER — LABETALOL HCL 5 MG/ML IV SOLN: 10.0000 mg | INTRAVENOUS | Status: AC | PRN

## 2020-05-16 MED ORDER — FENTANYL CITRATE (PF) 100 MCG/2ML IJ SOLN
INTRAMUSCULAR | Status: AC
  Filled 2020-05-16: qty 2

## 2020-05-16 MED ORDER — HEPARIN SODIUM (PORCINE) 1000 UNIT/ML IJ SOLN
INTRAMUSCULAR | Status: DC | PRN
  Administered 2020-05-16: 3000 [IU] via INTRAVENOUS

## 2020-05-16 MED ORDER — MIDAZOLAM HCL 2 MG/2ML IJ SOLN
INTRAMUSCULAR | Status: DC | PRN
  Administered 2020-05-16 (×2): 1 mg via INTRAVENOUS

## 2020-05-16 MED ORDER — SODIUM CHLORIDE 0.9% FLUSH
3.0000 mL | Freq: Two times a day (BID) | INTRAVENOUS | Status: DC
  Administered 2020-05-16: 3 mL via INTRAVENOUS

## 2020-05-16 MED ORDER — FAMOTIDINE IN NACL 20-0.9 MG/50ML-% IV SOLN
INTRAVENOUS | Status: AC | PRN
  Administered 2020-05-16: 20 mg via INTRAVENOUS

## 2020-05-16 MED ORDER — FENTANYL CITRATE (PF) 100 MCG/2ML IJ SOLN
INTRAMUSCULAR | Status: DC | PRN
  Administered 2020-05-16 (×2): 25 ug via INTRAVENOUS

## 2020-05-16 MED ORDER — SODIUM CHLORIDE 0.9 % IV SOLN
INTRAVENOUS | Status: AC | PRN
  Administered 2020-05-16: 10 mL/h via INTRAVENOUS

## 2020-05-16 MED ORDER — LIDOCAINE HCL (PF) 1 % IJ SOLN
INTRAMUSCULAR | Status: DC | PRN
  Administered 2020-05-16 (×2): 2 mL

## 2020-05-16 MED ORDER — HEPARIN SODIUM (PORCINE) 1000 UNIT/ML IJ SOLN
INTRAMUSCULAR | Status: DC | PRN
  Administered 2020-05-16: 4000 [IU] via INTRAVENOUS

## 2020-05-16 MED ORDER — CLOPIDOGREL BISULFATE 300 MG PO TABS
ORAL_TABLET | ORAL | Status: AC
  Filled 2020-05-16: qty 2

## 2020-05-16 MED ORDER — APIXABAN 5 MG PO TABS
5.0000 mg | ORAL_TABLET | Freq: Two times a day (BID) | ORAL | Status: DC
  Administered 2020-05-17: 5 mg via ORAL
  Filled 2020-05-16: qty 1

## 2020-05-16 MED ORDER — ENOXAPARIN SODIUM 40 MG/0.4ML ~~LOC~~ SOLN: 40.0000 mg | SUBCUTANEOUS | Status: DC

## 2020-05-16 MED ORDER — HEPARIN (PORCINE) 25000 UT/250ML-% IV SOLN
650.0000 [IU]/h | INTRAVENOUS | Status: DC
  Administered 2020-05-16: 650 [IU]/h via INTRAVENOUS

## 2020-05-16 MED ORDER — SODIUM CHLORIDE 0.9 % WEIGHT BASED INFUSION: 1.0000 mL/kg/h | INTRAVENOUS | Status: AC

## 2020-05-16 MED ORDER — HYDRALAZINE HCL 20 MG/ML IJ SOLN
INTRAMUSCULAR | Status: AC
  Filled 2020-05-16: qty 1

## 2020-05-16 MED ORDER — ONDANSETRON HCL 4 MG/2ML IJ SOLN
INTRAMUSCULAR | Status: AC
  Filled 2020-05-16: qty 2

## 2020-05-16 SURGICAL SUPPLY — 32 items
BALLN SAPPHIRE 2.5X20 (BALLOONS) ×2
BALLN SAPPHIRE ~~LOC~~ 3.5X18 (BALLOONS) ×2 IMPLANT
BALLN SAPPHIRE ~~LOC~~ 3.75X12 (BALLOONS) ×2 IMPLANT
BALLOON SAPPHIRE 2.5X20 (BALLOONS) ×1 IMPLANT
CATH 5FR JL3.5 JR4 ANG PIG MP (CATHETERS) ×2 IMPLANT
CATH BALLN WEDGE 5F 110CM (CATHETERS) ×2 IMPLANT
CATH INFINITI 5 FR LCB (CATHETERS) ×2 IMPLANT
CATH INFINITI 5FR AL1 (CATHETERS) ×2 IMPLANT
CATH LAUNCHER 6FR EBU3.5 (CATHETERS) ×2 IMPLANT
CATH ULTRASOUND OPTICROSS 6FR (CATHETERS) ×2 IMPLANT
CROWN DIAMONDBACK CLASSIC 1.25 (BURR) ×2 IMPLANT
DEVICE RAD COMP TR BAND LRG (VASCULAR PRODUCTS) ×2 IMPLANT
ELECT DEFIB PAD ADLT CADENCE (PAD) ×2 IMPLANT
GLIDESHEATH SLEND SS 6F .021 (SHEATH) ×4 IMPLANT
GUIDEWIRE .025 260CM (WIRE) ×2 IMPLANT
GUIDEWIRE INQWIRE 1.5J.035X260 (WIRE) ×1 IMPLANT
INQWIRE 1.5J .035X260CM (WIRE) ×2
KIT ENCORE 26 ADVANTAGE (KITS) ×2 IMPLANT
KIT HEART LEFT (KITS) ×2 IMPLANT
KIT MICROPUNCTURE NIT STIFF (SHEATH) ×4 IMPLANT
LUBRICANT VIPERSLIDE CORONARY (MISCELLANEOUS) ×2 IMPLANT
PACK CARDIAC CATHETERIZATION (CUSTOM PROCEDURE TRAY) ×2 IMPLANT
SHEATH GLIDE SLENDER 4/5FR (SHEATH) ×2 IMPLANT
SHEATH PROBE COVER 6X72 (BAG) ×2 IMPLANT
SLED PULL BACK IVUS (MISCELLANEOUS) ×2 IMPLANT
STENT RESOLUTE ONYX 3.0X34 (Permanent Stent) ×2 IMPLANT
STENT RESOLUTE ONYX 3.5X30 (Permanent Stent) ×2 IMPLANT
TRANSDUCER W/STOPCOCK (MISCELLANEOUS) ×2 IMPLANT
TUBING CIL FLEX 10 FLL-RA (TUBING) ×2 IMPLANT
WIRE ASAHI PROWATER 180CM (WIRE) ×2 IMPLANT
WIRE COUGAR XT STRL 190CM (WIRE) ×2 IMPLANT
WIRE VIPERWIRE COR FLEX .012 (WIRE) ×4 IMPLANT

## 2020-05-16 NOTE — Progress Notes (Signed)
CRITICAL VALUE ALERT  Critical Value:  Trop [id number]  Date & Time Notied:  5/21 0636   Provider Notified: Curly [place], PA  Orders Received/Actions taken: PA aware, pt already going to cath today

## 2020-05-16 NOTE — Progress Notes (Signed)
Advanced Heart Failure Rounding Note   Subjective:    Admitted with NSTEMI. Cath today with 3v CAD and severe PAH  hsTrop  Up to 1,455  Now CP free.   Objective:   Weight Range:  Vital Signs:   Temp:  [97.5 F (36.4 C)-98.4 F (36.9 C)] 97.5 F (36.4 C) (05/21 0801) Pulse Rate:  [59-85] 59 (05/21 0801) Resp:  [15-26] 16 (05/21 0801) BP: (134-175)/(54-99) 150/56 (05/21 0801) SpO2:  [92 %-100 %] 100 % (05/21 0839) Weight:  [74.8 kg-76 kg] 76 kg (05/21 0334) Last BM Date: 05/15/20  Weight change: Filed Weights   05/15/20 1158 05/16/20 0334  Weight: 74.8 kg 76 kg    Intake/Output:   Intake/Output Summary (Last 24 hours) at 05/16/2020 1038 Last data filed at 05/16/2020 0341 Gross per 24 hour  Intake 594.98 ml  Output 300 ml  Net 294.98 ml     Physical Exam: General:  Well appearing. No resp difficulty HEENT: normal Neck: supple. JVP 8. Carotids 2+ bilat; no bruits. No lymphadenopathy or thryomegaly appreciated. Cor: PMI nondisplaced. Regular rate & rhythm. No rubs, gallops or murmurs. Lungs: clear Abdomen: soft, nontender, nondistended. No hepatosplenomegaly. No bruits or masses. Good bowel sounds. Extremities: no cyanosis, clubbing, rash, edema Neuro: alert & orientedx3, cranial nerves grossly intact. moves all 4 extremities w/o difficulty. Affect pleasant   Telemetry: Sinus 1st AVB 50-60S .dbppr   Labs: Basic Metabolic Panel: Recent Labs  Lab 05/15/20 1202 05/15/20 1722 05/16/20 0435  NA 137  --  140  K 4.5  --  4.2  CL 97*  --  101  CO2 29  --  28  GLUCOSE 271*  --  160*  BUN 22  --  23  CREATININE 1.09*  --  1.03*  CALCIUM 10.1  --  9.6  MG  --  1.9  --     Liver Function Tests: No results for input(s): AST, ALT, ALKPHOS, BILITOT, PROT, ALBUMIN in the last 168 hours. No results for input(s): LIPASE, AMYLASE in the last 168 hours. No results for input(s): AMMONIA in the last 168 hours.  CBC: Recent Labs  Lab 05/15/20 1202 05/16/20  0435  WBC 4.5 4.7  HGB 16.3* 15.2*  HCT 52.5* 48.3*  MCV 98.3 97.2  PLT 191 176    Cardiac Enzymes: No results for input(s): CKTOTAL, CKMB, CKMBINDEX, TROPONINI in the last 168 hours.  BNP: BNP (last 3 results) Recent Labs    05/15/20 1722  BNP 269.0*    ProBNP (last 3 results) No results for input(s): PROBNP in the last 8760 hours.    Other results:  Imaging: DG Chest 2 View  Result Date: 05/15/2020 CLINICAL DATA:  Chest pain 1 week. EXAM: CHEST - 2 VIEW COMPARISON:  02/01/2018 FINDINGS: Lungs are adequately inflated without airspace consolidation or effusion. Cardiomediastinal silhouette and remainder of the exam is unchanged. IMPRESSION: No active cardiopulmonary disease. Electronically Signed   By: Marin Olp M.D.   On: 05/15/2020 12:53   CARDIAC CATHETERIZATION  Result Date: 05/16/2020  Prox RCA to Mid RCA lesion is 70% stenosed.  Dist RCA lesion is 100% stenosed.  Ost LAD to Prox LAD lesion is 95% stenosed.  Prox LAD to Mid LAD lesion is 50% stenosed.  1st Diag lesion is 90% stenosed.  1st Mrg lesion is 95% stenosed.  Prox Cx to Mid Cx lesion is 50% stenosed.  Findings: Ao = 172/68 (108) LV = 166/10 RA = 6 RV = 75/6 PA = 77/26 (  51) PCW = 10 Fick cardiac output/index = 4.0/2.2 PVR = 8.8 WU FA sat = 99% PA sat = 67%, 73% Assessment: 1. Severe 3v CAD 2. LVEF 65% 3. Severe PAH Plan/Discussion: Plan PCI LCX and LAD with Dr. Martinique. May need staged approach. Glori Bickers, MD 10:33 AM      Medications:     Scheduled Medications: . [MAR Hold] aspirin  324 mg Oral NOW   Or  . [MAR Hold] aspirin  300 mg Rectal NOW  . [MAR Hold] aspirin EC  81 mg Oral Daily  . [MAR Hold] carvedilol  6.25 mg Oral BID WC  . [MAR Hold] ezetimibe  10 mg Oral Daily  . [MAR Hold] furosemide  80 mg Oral BID  . [MAR Hold] insulin aspart  0-5 Units Subcutaneous QHS  . [MAR Hold] insulin aspart  0-6 Units Subcutaneous TID WC  . [MAR Hold] insulin aspart protamine- aspart  10 Units  Subcutaneous Q breakfast  . [MAR Hold] potassium chloride SA  40 mEq Oral Daily  . [MAR Hold] sodium chloride flush  3 mL Intravenous Once  . [MAR Hold] sodium chloride flush  3 mL Intravenous Q12H  . [MAR Hold] umeclidinium bromide  1 puff Inhalation Daily     Infusions: . sodium chloride 10 mL/hr at 05/15/20 1717  . sodium chloride    . sodium chloride 1 mL/kg/hr (05/16/20 [id number])  . heparin 650 Units/hr (05/16/20 0725)  . [MAR Hold] nitroGLYCERIN 10 mcg/min (05/15/20 1926)     PRN Medications:  sodium chloride, [MAR Hold] acetaminophen, [MAR Hold] ALPRAZolam, clopidogrel, fentaNYL, heparin sodium (porcine), heparin sodium (porcine), lidocaine (PF), midazolam, [MAR Hold] nitroGLYCERIN, [MAR Hold] ondansetron (ZOFRAN) IV, Radial Cocktail/Verapamil only, sodium chloride flush   Assessment/Plan:   1. NSTEMI with 3v CAD on cath - PCI today with Dr. Martinique - Will need Plavix/Xarelto. Will d/w if we need 30d ASA with IC team  2. PAH - severe by RHC - suspect due to OHA/OSA - will need to consider addition of PA vasodilators  3. PAF - In NSR now - Contiue DOAC  4. Chronic diastolic HF - volume status ok on cath - hold lasix for 1-2 days post cath  Length of Stay: 1   Glori Bickers MD 05/16/2020, 10:38 AM  Advanced Heart Failure Team Pager [phone] (M-F; 7a - 4p)  Please contact [facility] for night-coverage after hours (4p -7a ) and weekends on [email or web address]

## 2020-05-16 NOTE — Progress Notes (Signed)
TR BAND REMOVAL  LOCATION:    Right radial  DEFLATED PER PROTOCOL:    Yes.    TIME BAND OFF / DRESSING APPLIED:    1600p Applied gauze, tegaderm and coban   SITE UPON ARRIVAL:    Level 0  SITE AFTER BAND REMOVAL:    Level 0  CIRCULATION SENSATION AND MOVEMENT:    Within Normal Limits   Yes.    COMMENTS:   Care instructions given to pt

## 2020-05-16 NOTE — Progress Notes (Signed)
[place] for Heparin Indication: chest pain/ACS  Allergies  Allergen Reactions  . Amlodipine Besylate Other (See Comments)    REACTION: tingling in lips & gum edema  . Levaquin [Levofloxacin] Shortness Of Breath and Swelling    angioedema  . Lobster [Shellfish Allergy] Other (See Comments)    angioedema  . Penicillins Rash    Has patient had a PCN reaction causing immediate rash, facial/tongue/throat swelling, SOB or lightheadedness with hypotension: Yes Has patient had a PCN reaction causing severe rash involving mucus membranes or skin necrosis: No Has patient had a PCN reaction that required hospitalization: No Has patient had a PCN reaction occurring within the last 10 years: No If all of the above answers are "NO", then may proceed with Cephalosporin use.  . Valsartan Other (See Comments)    REACTION: angioedema  . Codeine Other (See Comments)    Mental status changes  . Lipitor [Atorvastatin] Other (See Comments)    weakness  . Oxycodone Other (See Comments)    hallucinations  . Statins     Made her too weak  . Tramadol   . Crestor [Rosuvastatin Calcium] Rash  . Tramadol Hcl Nausea And Vomiting    Patient Measurements: Height: 5\' 3"  (160 cm) Weight: 76 kg (167 lb 9.6 oz) IBW/kg (Calculated) : 52.4 Heparin Dosing Weight: 68.3 kg  Vital Signs: Temp: 98.3 F (36.8 C) (05/21 0334) Temp Source: Oral (05/21 0334) BP: 148/75 (05/21 0334) Pulse Rate: 71 (05/21 0334)  Labs: Recent Labs    05/15/20 1202 05/15/20 1400 05/16/20 0435  HGB 16.3*  --  15.2*  HCT 52.5*  --  48.3*  PLT 191  --  176  APTT  --   --  119*  HEPARINUNFRC  --   --  >2.20*  CREATININE 1.09*  --  1.03*  TROPONINIHS 191* 626* 1,455*    Estimated Creatinine Clearance: 38.3 mL/min (A) (by C-G formula based on SCr of 1.03 mg/dL (H)).   Medical History: Past Medical History:  Diagnosis Date  . Anemia    iron defficiency  . Anginal pain ([place])   .  Arthritis    "knees, feet, hands; joints" (05/27/2015)  . Asthma   . Atrial fibrillation or flutter    s/p RFCA 7/08;   s/p DCCV in past;   previously on amiodarone;  amio stopped due to lung toxicity  . CAD (coronary artery disease)    s/p NSTEMI tx with BMS to OM1 3/08;  cath 3/08: pOM 99% tx with PCI, pLAD 20%, ? mod stenosis at the AM  . CAP (community acquired pneumonia) 05/24/2015  . Chronic diastolic heart failure (HCC)    echo 11/11:  EF 55-60%, severe LVH, mod LAE, mild MR, mildly increased PASP  . COPD (chronic obstructive pulmonary disease) ([place])   . Degenerative joint disease   . Dystrophy, corneal stromal   . Heart murmur   . HLD (hyperlipidemia)   . HTN (hypertension)    essential nos  . Hypopotassemia    PMH of  . Muscle pain   . Myocardial infarction ([place]) 2008  . OSA on CPAP   . Osteoporosis   . Pneumonia 05/27/2015  . Protein calorie malnutrition ([place])   . Rash and nonspecific skin eruption    both arms,awaiting bio   dr Delman Cheadle  . Seasonal allergies   . Shortness of breath dyspnea   . Type II diabetes mellitus ([place])    Dr Loanne Drilling   Assessment: 86 year-old  female presenting with chest pain. Patient takes Eliquis for atrial fibrillation/flutter. Pharmacy consulted to dose heparin.  Last dose of Eliquis 5/20@1000 . Heparin level remains falsely elevated with recent apixaban (>2.2), aPTT also is supratherapeutic at 119, on 800 units/hr. Hgb 15.2, plt 176. Trop [id number]. No s/sx of bleeding or infusion issues- drawn correctly.   Goal of Therapy:  Heparin level 0.3-0.7 units/ml aPTT 66-102 seconds Monitor platelets by anticoagulation protocol: Yes   Plan:  Hold heparin infusion for 30 minutes  Restart heparin infusion at 650 units/hr  Get aPTT in 8 hours after restart Monitor daily HL/aPTT until correlate, CBC, and for s/sx of bleeding  Antonietta Jewel, PharmD, BCCCP Clinical Pharmacist  05/16/2020 6:33 AM  Please check AMION for all [facility] phone  numbers After 10:00 PM, call [place] [phone]

## 2020-05-16 NOTE — Interval H&P Note (Signed)
History and Physical Interval Note:  05/16/2020 9:05 AM  [name]  has presented today for surgery, with the diagnosis of unstable angina.  The various methods of treatment have been discussed with the patient and family. After consideration of risks, benefits and other options for treatment, the patient has consented to  Procedure(s): RIGHT/LEFT HEART CATH AND CORONARY ANGIOGRAPHY (N/A) and possible coronary angioplasty as a surgical intervention.  The patient's history has been reviewed, patient examined, no change in status, stable for surgery.  I have reviewed the patient's chart and labs.  Questions were answered to the patient's satisfaction.     Blyss Lugar

## 2020-05-16 NOTE — Progress Notes (Signed)
Site area: Right brachial  Venous 5 french sheath was removed  Site Prior to Removal:  Level 0  Pressure Applied For 5 MINUTES    Bedrest is until radial band if off   Manual:   Yes.    Patient Status During Pull:  stable  Post Pull Groin Site:  Level 0  Post Pull Instructions Given:  Yes.    Post Pull Pulses Present:  Yes.    Dressing Applied:  Yes.    Comments:

## 2020-05-16 NOTE — Discharge Instructions (Signed)
De-escalation of Triple Therapy Post-PCI  Underwent cardiac catheterization with placement of drug eluting stents on 05/16/20. Plan for triple therapy with aspirin 81 mg and clopidogrel (Plavix) in addition to oral anticoagulation using apixaban (Eliquis). Plan to discontinue aspirin on 06/16/20.

## 2020-05-16 NOTE — Progress Notes (Signed)
Echocardiogram 2D Echocardiogram has been performed.  [name] 05/16/2020, 8:15 AM

## 2020-05-16 NOTE — Progress Notes (Signed)
CRITICAL VALUE ALERT  Critical Value: PTT >200  Date & Time Notied: 5/21 1730  Provider Notified: Pharmacy and Dorene Ar, NP  Orders Received/Actions taken: continue to monitor

## 2020-05-17 LAB — BASIC METABOLIC PANEL
Anion gap: 9 (ref 5–15)
BUN: 20 mg/dL (ref 8–23)
CO2: 25 mmol/L (ref 22–32)
Calcium: 9.4 mg/dL (ref 8.9–10.3)
Chloride: 105 mmol/L (ref 98–111)
Creatinine, Ser: 1.07 mg/dL — ABNORMAL HIGH (ref 0.44–1.00)
GFR calc Af Amer: 54 mL/min — ABNORMAL LOW (ref >60)
GFR calc non Af Amer: 47 mL/min — ABNORMAL LOW (ref >60)
Glucose, Bld: 138 mg/dL — ABNORMAL HIGH (ref 70–99)
Potassium: 4.8 mmol/L (ref 3.5–5.1)
Sodium: 139 mmol/L (ref 135–145)

## 2020-05-17 LAB — CBC
HCT: 47.8 % — ABNORMAL HIGH (ref 36.0–46.0)
Hemoglobin: 15 g/dL (ref 12.0–15.0)
MCH: 30.7 pg (ref 26.0–34.0)
MCHC: 31.4 g/dL (ref 30.0–36.0)
MCV: 98 fL (ref 80.0–100.0)
Platelets: 170 10*3/uL (ref 150–400)
RBC: 4.88 MIL/uL (ref 3.87–5.11)
RDW: 14 % (ref 11.5–15.5)
WBC: 5.3 10*3/uL (ref 4.0–10.5)
nRBC: 0 % (ref 0.0–0.2)

## 2020-05-17 LAB — GLUCOSE, CAPILLARY
Glucose-Capillary: 177 mg/dL — ABNORMAL HIGH (ref 70–99)
Glucose-Capillary: 317 mg/dL — ABNORMAL HIGH (ref 70–99)

## 2020-05-17 MED ORDER — FUROSEMIDE 10 MG/ML IJ SOLN
80.0000 mg | Freq: Once | INTRAMUSCULAR | Status: AC
  Administered 2020-05-17: 80 mg via INTRAVENOUS
  Filled 2020-05-17: qty 8

## 2020-05-17 MED ORDER — ASPIRIN 81 MG PO TBEC: 81.0000 mg | DELAYED_RELEASE_TABLET | Freq: Every day | ORAL | 0 refills | Status: DC

## 2020-05-17 MED ORDER — CLOPIDOGREL BISULFATE 75 MG PO TABS: 75.0000 mg | ORAL_TABLET | Freq: Every day | ORAL | 2 refills | Status: DC

## 2020-05-17 NOTE — Progress Notes (Signed)
Advanced Heart Failure Rounding Note   Subjective:    Admitted with NSTEMI. Cath with 3v CAD and severe PAH  Underwent PCI of OM/LCX and LAD yesterday. No further CP.   Feels more SOB this am  Objective:   Weight Range:  Vital Signs:   Temp:  [97.7 F (36.5 C)-98.2 F (36.8 C)] 98.2 F (36.8 C) (05/22 0626) Pulse Rate:  [0-189] 91 (05/22 0626) Resp:  [0-139] 18 (05/22 0626) BP: (72-189)/(43-97) 136/90 (05/22 0626) SpO2:  [0 %-100 %] 93 % (05/22 0626) Weight:  [76.5 kg] 76.5 kg (05/22 0626) Last BM Date: 05/15/20  Weight change: Filed Weights   05/15/20 1158 05/16/20 0334 05/17/20 0626  Weight: 74.8 kg 76 kg 76.5 kg    Intake/Output:   Intake/Output Summary (Last 24 hours) at 05/17/2020 0821 Last data filed at 05/16/2020 1800 Gross per 24 hour  Intake 322.71 ml  Output --  Net 322.71 ml     Physical Exam: General:  Well appearing. No resp difficulty HEENT: normal Neck: supple. JVP to jaw Carotids 2+ bilat; no bruits. No lymphadenopathy or thryomegaly appreciated. Cor: PMI nondisplaced. Regular rate & rhythm. No rubs, gallops or murmurs. Lungs: clear Abdomen: soft, nontender, nondistended. No hepatosplenomegaly. No bruits or masses. Good bowel sounds. Extremities: no cyanosis, clubbing, rash, edema Neuro: alert & orientedx3, cranial nerves grossly intact. moves all 4 extremities w/o difficulty. Affect pleasant    Telemetry: Sinus 1st AVB 70-80s Personally reviewed    Labs: Basic Metabolic Panel: Recent Labs  Lab 05/15/20 1202 05/15/20 1722 05/16/20 0435 05/16/20 0938 05/16/20 0944 05/17/20 0427  NA 137  --  140 141 141  151* 139  K 4.5  --  4.2 4.5 4.6  3.1* 4.8  CL 97*  --  101  --   --  105  CO2 29  --  28  --   --  25  GLUCOSE 271*  --  160*  --   --  138*  BUN 22  --  23  --   --  20  CREATININE 1.09*  --  1.03*  --   --  1.07*  CALCIUM 10.1  --  9.6  --   --  9.4  MG  --  1.9  --   --   --   --     Liver Function Tests: No  results for input(s): AST, ALT, ALKPHOS, BILITOT, PROT, ALBUMIN in the last 168 hours. No results for input(s): LIPASE, AMYLASE in the last 168 hours. No results for input(s): AMMONIA in the last 168 hours.  CBC: Recent Labs  Lab 05/15/20 1202 05/16/20 0435 05/16/20 0938 05/16/20 0944 05/17/20 0427  WBC 4.5 4.7  --   --  5.3  HGB 16.3* 15.2* 16.0* 16.3*  13.3 15.0  HCT 52.5* 48.3* 47.0* 48.0*  39.0 47.8*  MCV 98.3 97.2  --   --  98.0  PLT 191 176  --   --  170    Cardiac Enzymes: No results for input(s): CKTOTAL, CKMB, CKMBINDEX, TROPONINI in the last 168 hours.  BNP: BNP (last 3 results) Recent Labs    05/15/20 1722  BNP 269.0*    ProBNP (last 3 results) No results for input(s): PROBNP in the last 8760 hours.    Other results:  Imaging: DG Chest 2 View  Result Date: 05/15/2020 CLINICAL DATA:  Chest pain 1 week. EXAM: CHEST - 2 VIEW COMPARISON:  02/01/2018 FINDINGS: Lungs are adequately inflated without airspace consolidation or  effusion. Cardiomediastinal silhouette and remainder of the exam is unchanged. IMPRESSION: No active cardiopulmonary disease. Electronically Signed   By: Marin Olp M.D.   On: 05/15/2020 12:53   CARDIAC CATHETERIZATION  Result Date: 05/16/2020  Prox RCA to Mid RCA lesion is 70% stenosed.  Dist RCA lesion is 100% stenosed.  Ost LAD to Prox LAD lesion is 95% stenosed.  Prox LAD to Mid LAD lesion is 50% stenosed.  1st Diag lesion is 90% stenosed.  1st Mrg lesion is 95% stenosed.  Prox Cx to Mid Cx lesion is 50% stenosed.  Findings: Ao = 172/68 (108) LV = 166/10 RA = 6 RV = 75/6 PA = 77/26 (45) PCW = 10 Fick cardiac output/index = 4.0/2.2 PVR = 8.8 WU FA sat = 99% PA sat = 67%, 73% Assessment: 1. Severe 3v CAD 2. LVEF 65% 3. Severe PAH Plan/Discussion: Plan PCI LCX and LAD with Dr. [place]. May need staged approach. Glori Bickers, MD 10:33 AM   CARDIAC CATHETERIZATION  Result Date: 05/16/2020  Prox LAD lesion is 95% stenosed.  A  drug-eluting stent was successfully placed using a STENT RESOLUTE ONYX 3.5X30.  Post intervention, there is a 0% residual stenosis.  1st Diag lesion is 80% stenosed.  Prox Cx lesion is 70% stenosed.  Prox RCA lesion is 80% stenosed.  Mid RCA to Dist RCA lesion is 100% stenosed.  1st Mrg-1 lesion is 95% stenosed.  Post intervention, there is a 0% residual stenosis.  A drug-eluting stent was successfully placed using a STENT RESOLUTE ONYX 3.0X34.  Post intervention, there is a 0% residual stenosis.  1st Mrg-2 lesion is 70% stenosed.  1. Severe 3 vessel obstructive CAD 2. Severe pulmonary HTN 3. Successful PCI of the proximal LCx/OM1 with DES x 1 with IVUS guidance 4. Successful PCI of the proximal LAD with orbital atherectomy and DES x 1 Plan: DAPT with ASA 81 mg daily for one month and Plavix 75 mg daily for one year. May resume Xarelto tomorrow. Anticipate DC tomorrow if no complications.   ECHOCARDIOGRAM COMPLETE  Result Date: 05/16/2020    ECHOCARDIOGRAM REPORT   Patient Name:   [name] Date of Exam: 05/16/2020 Medical Rec #:  [id number]         Height:       63.0 in Accession #:    [id number]        Weight:       167.6 lb Date of Birth:  03/25/1931        BSA:          1.794 m Patient Age:    86 years          BP:           150/56 mmHg Patient Gender: F                 HR:           75 bpm. Exam Location:  Inpatient Procedure: 2D Echo, Color Doppler, Cardiac Doppler and Strain Analysis Indications:    R07.9* Chest pain, unspecified  History:        Patient has prior history of Echocardiogram examinations, most                 recent 10/31/2017. CAD, COPD, Arrythmias:Atrial Fibrillation;                 Risk Factors:Hypertension, Diabetes, Dyslipidemia and Sleep  Apnea.  Sonographer:    Raquel Sarna Senior RDCS Referring Phys: [place]  1. Left ventricular ejection fraction, by estimation, is 50 to 55%. The left ventricle has low normal function. Left ventricular  endocardial border not optimally defined to evaluate regional wall motion. There is moderate left ventricular hypertrophy. Left ventricular diastolic parameters are consistent with Grade III diastolic dysfunction (restrictive).  2. Right ventricular systolic function is mildly reduced. The right ventricular size is normal. Tricuspid regurgitation signal is inadequate for assessing PA pressure.  3. Left atrial size was mildly dilated.  4. Right atrial size was mildly dilated.  5. The mitral valve is normal in structure. Mild mitral valve regurgitation. No evidence of mitral stenosis.  6. The aortic valve is normal in structure. Aortic valve regurgitation is not visualized. No aortic stenosis is present.  7. The inferior vena cava is normal in size with <50% respiratory variability, suggesting right atrial pressure of 8 mmHg. FINDINGS  Left Ventricle: Abnormal septal motion may be secondary to conduction delay. Left ventricular ejection fraction, by estimation, is 50 to 55%. The left ventricle has low normal function. Left ventricular endocardial border not optimally defined to evaluate regional wall motion. Global longitudinal strain performed but not reported based on interpreter judgement due to suboptimal tracking. The left ventricular internal cavity size was normal in size. There is moderate left ventricular hypertrophy. Left ventricular diastolic parameters are consistent with Grade III diastolic dysfunction (restrictive). Right Ventricle: The right ventricular size is normal. No increase in right ventricular wall thickness. Right ventricular systolic function is mildly reduced. Tricuspid regurgitation signal is inadequate for assessing PA pressure. Left Atrium: Left atrial size was mildly dilated. Right Atrium: Right atrial size was mildly dilated. Pericardium: There is no evidence of pericardial effusion. Mitral Valve: The mitral valve is normal in structure. Normal mobility of the mitral valve leaflets. Mild  mitral valve regurgitation. No evidence of mitral valve stenosis. Tricuspid Valve: The tricuspid valve is normal in structure. Tricuspid valve regurgitation is trivial. No evidence of tricuspid stenosis. Aortic Valve: The aortic valve is normal in structure. Aortic valve regurgitation is not visualized. No aortic stenosis is present. Pulmonic Valve: The pulmonic valve was not well visualized. Pulmonic valve regurgitation is trivial. No evidence of pulmonic stenosis. Aorta: The aortic root is normal in size and structure. Venous: The inferior vena cava is normal in size with less than 50% respiratory variability, suggesting right atrial pressure of 8 mmHg. IAS/Shunts: No atrial level shunt detected by color flow Doppler.  LEFT VENTRICLE PLAX 2D LVIDd:         3.30 cm  Diastology LVIDs:         2.40 cm  LV e' lateral:   5.00 cm/s LV PW:         1.60 cm  LV E/e' lateral: 19.4 LV IVS:        1.30 cm  LV e' medial:    4.57 cm/s LVOT diam:     1.90 cm  LV E/e' medial:  21.2 LV SV:         38 LV SV Index:   21 LVOT Area:     2.84 cm  RIGHT VENTRICLE RV S prime:     6.64 cm/s TAPSE (M-mode): 1.4 cm LEFT ATRIUM             Index       RIGHT ATRIUM           Index LA diam:  4.10 cm 2.29 cm/m  RA Area:     19.50 cm LA Vol (A2C):   58.3 ml 32.50 ml/m RA Volume:   54.90 ml  30.61 ml/m LA Vol (A4C):   69.5 ml 38.75 ml/m LA Biplane Vol: 64.6 ml 36.01 ml/m  AORTIC VALVE LVOT Vmax:   62.00 cm/s LVOT Vmean:  42.200 cm/s LVOT VTI:    0.133 m  AORTA Ao Root diam: 3.00 cm Ao Asc diam:  2.60 cm MITRAL VALVE MV Area (PHT): 4.31 cm    SHUNTS MV Decel Time: 176 msec    Systemic VTI:  0.13 m MV E velocity: 96.80 cm/s  Systemic Diam: 1.90 cm MV A velocity: 41.10 cm/s MV E/A ratio:  2.36 Cherlynn Kaiser MD Electronically signed by Cherlynn Kaiser MD Signature Date/Time: 05/16/2020/12:20:57 PM    Final      Medications:     Scheduled Medications: . apixaban  5 mg Oral BID  . aspirin EC  81 mg Oral Daily  . carvedilol   6.25 mg Oral BID WC  . clopidogrel  75 mg Oral Q breakfast  . ezetimibe  10 mg Oral Daily  . furosemide  80 mg Intravenous Once  . insulin aspart  0-5 Units Subcutaneous QHS  . insulin aspart  0-6 Units Subcutaneous TID WC  . insulin aspart protamine- aspart  10 Units Subcutaneous Q breakfast  . potassium chloride SA  40 mEq Oral Daily  . sodium chloride flush  3 mL Intravenous Once  . sodium chloride flush  3 mL Intravenous Q12H  . sodium chloride flush  3 mL Intravenous Q12H  . umeclidinium bromide  1 puff Inhalation Daily    Infusions: . sodium chloride 10 mL/hr at 05/15/20 1717  . sodium chloride    . nitroGLYCERIN Stopped (05/16/20 1912)    PRN Medications: sodium chloride, acetaminophen, ALPRAZolam, nitroGLYCERIN, ondansetron (ZOFRAN) IV, sodium chloride flush   Assessment/Plan:   1. NSTEMI with 3v CAD on cath - S/p PCI OM/LCX and LAD - Will need Plavix/Xarelto/ASA time 1 month then drop aspirin - Continue statin and b-blocker.  - refer outpatient CR  2. PAH - severe by RHC - suspect due to OHA/OSA. She restarted CPAP several weeks ago - will need to consider addition of PA vasodilators as outpatient  3. PAF - In NSR now - Contiue DOAC  4. Chronic diastolic HF - volume status up after cath - give lasix 80 IV this am  Hopefully home this afternoon.   Length of Stay: 2   Glori Bickers MD 05/17/2020, 8:21 AM  Advanced Heart Failure Team Pager [phone] (M-F; 7a - 4p)  Please contact [facility] for night-coverage after hours (4p -7a ) and weekends on [email or web address]

## 2020-05-17 NOTE — Discharge Summary (Addendum)
Discharge Summary    Patient ID: [name],  MRN: [id number], DOB/AGE: [birth year] 86 y.o.  Admit date: 05/15/2020 Discharge date: 05/17/2020  Primary Care Provider: [name] Primary Cardiologist: Dr. Haroldine Laws  Discharge Diagnoses    Principal Problem:   Unstable angina [facility]) Active Problems:   HYPERLIPIDEMIA   Pulmonary HTN ([place])   Chronic diastolic heart failure (HCC)   Diabetes (HCC)   Allergies Allergies  Allergen Reactions   Amlodipine Besylate Other (See Comments)    REACTION: tingling in lips & gum edema   Levaquin [Levofloxacin] Shortness Of Breath and Swelling    angioedema   Lobster [Shellfish Allergy] Other (See Comments)    angioedema   Penicillins Rash    Has patient had a PCN reaction causing immediate rash, facial/tongue/throat swelling, SOB or lightheadedness with hypotension: Yes Has patient had a PCN reaction causing severe rash involving mucus membranes or skin necrosis: No Has patient had a PCN reaction that required hospitalization: No Has patient had a PCN reaction occurring within the last 10 years: No If all of the above answers are "NO", then may proceed with Cephalosporin use.   Valsartan Other (See Comments)    REACTION: angioedema   Codeine Other (See Comments)    Mental status changes   Lipitor [Atorvastatin] Other (See Comments)    weakness   Oxycodone Other (See Comments)    hallucinations   Statins     Made her too weak   Tramadol    Crestor [Rosuvastatin Calcium] Rash   Tramadol Hcl Nausea And Vomiting    Diagnostic Studies/Procedures    Cath: 05/16/20  Prox LAD lesion is 95% stenosed. A drug-eluting stent was successfully placed using a STENT RESOLUTE ONYX 3.5X30. Post intervention, there is a 0% residual stenosis. 1st Diag lesion is 80% stenosed. Prox Cx lesion is 70% stenosed. Prox RCA lesion is 80% stenosed. Mid RCA to Dist RCA lesion is 100% stenosed. 1st Mrg-1 lesion is 95% stenosed. Post  intervention, there is a 0% residual stenosis. A drug-eluting stent was successfully placed using a STENT RESOLUTE ONYX 3.0X34. Post intervention, there is a 0% residual stenosis. 1st Mrg-2 lesion is 70% stenosed.   1. Severe 3 vessel obstructive CAD 2. Severe pulmonary HTN 3. Successful PCI of the proximal LCx/OM1 with DES x 1 with IVUS guidance 4. Successful PCI of the proximal LAD with orbital atherectomy and DES x 1   Plan: DAPT with ASA 81 mg daily for one month and Plavix 75 mg daily for one year. May resume Xarelto tomorrow. Anticipate DC tomorrow if no complications.   Diagnostic Dominance: Right  Intervention    Echo: 05/16/20  IMPRESSIONS     1. Left ventricular ejection fraction, by estimation, is 50 to 55%. The  left ventricle has low normal function. Left ventricular endocardial  border not optimally defined to evaluate regional wall motion. There is  moderate left ventricular hypertrophy.  Left ventricular diastolic parameters are consistent with Grade III  diastolic dysfunction (restrictive).   2. Right ventricular systolic function is mildly reduced. The right  ventricular size is normal. Tricuspid regurgitation signal is inadequate  for assessing PA pressure.   3. Left atrial size was mildly dilated.   4. Right atrial size was mildly dilated.   5. The mitral valve is normal in structure. Mild mitral valve  regurgitation. No evidence of mitral stenosis.   6. The aortic valve is normal in structure. Aortic valve regurgitation is  not visualized. No aortic  stenosis is present.   7. The inferior vena cava is normal in size with <50% respiratory  variability, suggesting right atrial pressure of 8 mmHg.  _____________   History of Present Illness     [name] with PMH of CAD with BMS 2008, HTN, Afib/flutter, chronic systolic HF, COPD, PAD with recent PTA and stent of right external iliac artery who presented with chest pain. Her last cath in 2015 with single  vessel obstructive CAD with moderate disease in prox and mRCA, with stent in 1st OM widely patent.  Severe pulmonary HTN,  Nuc study 09/25/19 with very mild ischemia mid inferolateral area, that may be artifact;  Echo 10/31/17 with EF 65-70% G3DD.  She presented to the ED on 5/20 with chest pain that was exertional in nature. NTG would relieve but then returned.  Had been episodic for about a week.   Occurs with rest and exertion.  Some SOB associated but no radiation or diaphoresis with the pain.  She had been having diaphoresis at night recently.      EKG:  The ECG that was done 05/15/20 was personally reviewed and demonstrates SR with 1st degree AV block PR 294 ms T wave inversions V4-5  Troponin 191, Na 137, K+ 4.5 glucose 271, Cr 1.09  Hgb 16.3, Hct 52.5 WBC 4.5 and plts 191  2V CXR No active cardiopulmonary disease   At time of assessment in the ED she was resting in bed no chest pain and no SOB.  Tele with SR 1st degree aV block that appears to lengthen and then Mobitz I  But not frequently.  Her last dose of eliquis was the AM of admission.  Hospital Course    1. NSTEMI with 3v CAD on cath: underwent cardiac cath noted above now s/p PCI/DES x1 OM/LCX and DES x1 to LAD with Dr. Martinique. hsTn peaked at 1455. Will need Plavix/Eliquis/ASA time 1 month then drop aspirin. Referred for outpatient cardiac rehab. - Continue statin and b-blocker.    2. PAH: noted to be severe by [facility]. It was felt this was likely due to OHA/OSA. She was restarted CPAP several weeks ago. Per Dr. Haroldine Laws will need to consider addition of PA vasodilators as outpatient   3. PAF: remained in SR. Restarted on DOAC at the time of discharge.   4. Chronic diastolic HF: fluid volume noted to be elevated post cath. She was given IV lasix 80mg  x1 with improvement. Able to ambulate without significant dyspnea and able to maintain sats. -- restart home regimen of lasix at discharge  5. HL: on zetia and repatha as an outpatient.  Follows with Dr. Debara Pickett as an outpatient. Resume home meds at discharge.  6. DM: Hgb A1c 8.3. Covered with SSI while inpatient. Resume home insulin regimen and jardiance at discharge.   _____________  Discharge Vitals Blood pressure (!) 158/88, pulse 78, temperature 98.3 F (36.8 C), temperature source Oral, resp. rate 16, height 5\' 3"  (1.6 m), weight 76.5 kg, SpO2 93 %.  Filed Weights   05/15/20 1158 05/16/20 0334 05/17/20 0626  Weight: 74.8 kg 76 kg 76.5 kg    Labs & Radiologic Studies    CBC Recent Labs    05/16/20 0435 05/16/20 0938 05/16/20 0944 05/17/20 0427  WBC 4.7  --   --  5.3  HGB 15.2*   < > 16.3*  13.3 15.0  HCT 48.3*   < > 48.0*  39.0 47.8*  MCV 97.2  --   --  98.0  PLT 176  --   --  170   < > = values in this interval not displayed.   Basic Metabolic Panel Recent Labs    05/15/20 1202 05/15/20 1722 05/16/20 0435 05/16/20 0938 05/16/20 0944 05/17/20 0427  NA   < >  --  140   < > 141  151* 139  K   < >  --  4.2   < > 4.6  3.1* 4.8  CL   < >  --  101  --   --  105  CO2   < >  --  28  --   --  25  GLUCOSE   < >  --  160*  --   --  138*  BUN   < >  --  23  --   --  20  CREATININE   < >  --  1.03*  --   --  1.07*  CALCIUM   < >  --  9.6  --   --  9.4  MG  --  1.9  --   --   --   --    < > = values in this interval not displayed.   Liver Function Tests No results for input(s): AST, ALT, ALKPHOS, BILITOT, PROT, ALBUMIN in the last 72 hours. No results for input(s): LIPASE, AMYLASE in the last 72 hours. Cardiac Enzymes No results for input(s): CKTOTAL, CKMB, CKMBINDEX, TROPONINI in the last 72 hours. BNP Invalid input(s): POCBNP D-Dimer No results for input(s): DDIMER in the last 72 hours. Hemoglobin A1C Recent Labs    05/15/20 1722  HGBA1C 8.3*   Fasting Lipid Panel Recent Labs    05/16/20 0435  CHOL 164  HDL 50  LDLCALC 96  TRIG 90  CHOLHDL 3.3   Thyroid Function Tests Recent Labs    05/15/20 1722  TSH 0.866   _____________    DG Chest 2 View  Result Date: 05/15/2020 CLINICAL DATA:  Chest pain 1 week. EXAM: CHEST - 2 VIEW COMPARISON:  02/01/2018 FINDINGS: Lungs are adequately inflated without airspace consolidation or effusion. Cardiomediastinal silhouette and remainder of the exam is unchanged. IMPRESSION: No active cardiopulmonary disease. Electronically Signed   By: Marin Olp M.D.   On: 05/15/2020 12:53   CARDIAC CATHETERIZATION  Result Date: 05/16/2020  Prox RCA to Mid RCA lesion is 70% stenosed.  Dist RCA lesion is 100% stenosed.  Ost LAD to Prox LAD lesion is 95% stenosed.  Prox LAD to Mid LAD lesion is 50% stenosed.  1st Diag lesion is 90% stenosed.  1st Mrg lesion is 95% stenosed.  Prox Cx to Mid Cx lesion is 50% stenosed.  Findings: Ao = 172/68 (108) LV = 166/10 RA = 6 RV = 75/6 PA = 77/26 (45) PCW = 10 Fick cardiac output/index = 4.0/2.2 PVR = 8.8 WU FA sat = 99% PA sat = 67%, 73% Assessment: 1. Severe 3v CAD 2. LVEF 65% 3. Severe PAH Plan/Discussion: Plan PCI LCX and LAD with Dr. Martinique. May need staged approach. Glori Bickers, MD 10:33 AM   CARDIAC CATHETERIZATION  Result Date: 05/16/2020  Prox LAD lesion is 95% stenosed.  A drug-eluting stent was successfully placed using a STENT RESOLUTE ONYX 3.5X30.  Post intervention, there is a 0% residual stenosis.  1st Diag lesion is 80% stenosed.  Prox Cx lesion is 70% stenosed.  Prox RCA lesion is 80% stenosed.  Mid RCA to Dist RCA lesion is 100% stenosed.  1st Mrg-1  lesion is 95% stenosed.  Post intervention, there is a 0% residual stenosis.  A drug-eluting stent was successfully placed using a STENT RESOLUTE ONYX 3.0X34.  Post intervention, there is a 0% residual stenosis.  1st Mrg-2 lesion is 70% stenosed.  1. Severe 3 vessel obstructive CAD 2. Severe pulmonary HTN 3. Successful PCI of the proximal LCx/OM1 with DES x 1 with IVUS guidance 4. Successful PCI of the proximal LAD with orbital atherectomy and DES x 1 Plan: DAPT with ASA 81 mg daily  for one month and Plavix 75 mg daily for one year. May resume Xarelto tomorrow. Anticipate DC tomorrow if no complications.   ECHOCARDIOGRAM COMPLETE  Result Date: 05/16/2020    ECHOCARDIOGRAM REPORT   Patient Name:   [name] Date of Exam: 05/16/2020 Medical Rec #:  [id number]         Height:       63.0 in Accession #:    [id number]        Weight:       167.6 lb Date of Birth:  1931/03/05        BSA:          1.794 m Patient Age:    45 years          BP:           150/56 mmHg Patient Gender: F                 HR:           75 bpm. Exam Location:  Inpatient Procedure: 2D Echo, Color Doppler, Cardiac Doppler and Strain Analysis Indications:    R07.9* Chest pain, unspecified  History:        Patient has prior history of Echocardiogram examinations, most                 recent 10/31/2017. CAD, COPD, Arrythmias:Atrial Fibrillation;                 Risk Factors:Hypertension, Diabetes, Dyslipidemia and Sleep                 Apnea.  Sonographer:    Raquel Sarna Senior RDCS Referring Phys: [place]  1. Left ventricular ejection fraction, by estimation, is 50 to 55%. The left ventricle has low normal function. Left ventricular endocardial border not optimally defined to evaluate regional wall motion. There is moderate left ventricular hypertrophy. Left ventricular diastolic parameters are consistent with Grade III diastolic dysfunction (restrictive).  2. Right ventricular systolic function is mildly reduced. The right ventricular size is normal. Tricuspid regurgitation signal is inadequate for assessing PA pressure.  3. Left atrial size was mildly dilated.  4. Right atrial size was mildly dilated.  5. The mitral valve is normal in structure. Mild mitral valve regurgitation. No evidence of mitral stenosis.  6. The aortic valve is normal in structure. Aortic valve regurgitation is not visualized. No aortic stenosis is present.  7. The inferior vena cava is normal in size with <50% respiratory  variability, suggesting right atrial pressure of 8 mmHg. FINDINGS  Left Ventricle: Abnormal septal motion may be secondary to conduction delay. Left ventricular ejection fraction, by estimation, is 50 to 55%. The left ventricle has low normal function. Left ventricular endocardial border not optimally defined to evaluate regional wall motion. Global longitudinal strain performed but not reported based on interpreter judgement due to suboptimal tracking. The left ventricular internal cavity size was normal in size. There is moderate left  ventricular hypertrophy. Left ventricular diastolic parameters are consistent with Grade III diastolic dysfunction (restrictive). Right Ventricle: The right ventricular size is normal. No increase in right ventricular wall thickness. Right ventricular systolic function is mildly reduced. Tricuspid regurgitation signal is inadequate for assessing PA pressure. Left Atrium: Left atrial size was mildly dilated. Right Atrium: Right atrial size was mildly dilated. Pericardium: There is no evidence of pericardial effusion. Mitral Valve: The mitral valve is normal in structure. Normal mobility of the mitral valve leaflets. Mild mitral valve regurgitation. No evidence of mitral valve stenosis. Tricuspid Valve: The tricuspid valve is normal in structure. Tricuspid valve regurgitation is trivial. No evidence of tricuspid stenosis. Aortic Valve: The aortic valve is normal in structure. Aortic valve regurgitation is not visualized. No aortic stenosis is present. Pulmonic Valve: The pulmonic valve was not well visualized. Pulmonic valve regurgitation is trivial. No evidence of pulmonic stenosis. Aorta: The aortic root is normal in size and structure. Venous: The inferior vena cava is normal in size with less than 50% respiratory variability, suggesting right atrial pressure of 8 mmHg. IAS/Shunts: No atrial level shunt detected by color flow Doppler.  LEFT VENTRICLE PLAX 2D LVIDd:         3.30 cm   Diastology LVIDs:         2.40 cm  LV e' lateral:   5.00 cm/s LV PW:         1.60 cm  LV E/e' lateral: 19.4 LV IVS:        1.30 cm  LV e' medial:    4.57 cm/s LVOT diam:     1.90 cm  LV E/e' medial:  21.2 LV SV:         38 LV SV Index:   21 LVOT Area:     2.84 cm  RIGHT VENTRICLE RV S prime:     6.64 cm/s TAPSE (M-mode): 1.4 cm LEFT ATRIUM             Index       RIGHT ATRIUM           Index LA diam:        4.10 cm 2.29 cm/m  RA Area:     19.50 cm LA Vol (A2C):   58.3 ml 32.50 ml/m RA Volume:   54.90 ml  30.61 ml/m LA Vol (A4C):   69.5 ml 38.75 ml/m LA Biplane Vol: 64.6 ml 36.01 ml/m  AORTIC VALVE LVOT Vmax:   62.00 cm/s LVOT Vmean:  42.200 cm/s LVOT VTI:    0.133 m  AORTA Ao Root diam: 3.00 cm Ao Asc diam:  2.60 cm MITRAL VALVE MV Area (PHT): 4.31 cm    SHUNTS MV Decel Time: 176 msec    Systemic VTI:  0.13 m MV E velocity: 96.80 cm/s  Systemic Diam: 1.90 cm MV A velocity: 41.10 cm/s MV E/A ratio:  2.36 Cherlynn Kaiser MD Electronically signed by Cherlynn Kaiser MD Signature Date/Time: 05/16/2020/12:20:57 PM    Final    Disposition   Pt is being discharged home today in good condition.  Follow-up Plans & Appointments    Follow-up Information     Ehab Humber, Shaune Pascal, MD Follow up.   Specialty: Cardiology Why: Office will call you with an appt Contact information: [place] [address] [phone]           Discharge Instructions     ([place]) Call MD:  Anytime you have any of the following symptoms: 1)  3 pound weight gain in 24 hours or 5 pounds in 1 week 2) shortness of breath, with or without a dry hacking cough 3) swelling in the hands, feet or stomach 4) if you have to sleep on extra pillows at night in order to breathe.   Complete by: As directed    Amb Referral to Cardiac Rehabilitation   Complete by: As directed    Diagnosis:  Coronary Stents NSTEMI     After initial evaluation and assessments completed: Virtual Based Care  may be provided alone or in conjunction with Phase 2 Cardiac Rehab based on patient barriers.: Yes   Call MD for:  redness, tenderness, or signs of infection (pain, swelling, redness, odor or green/yellow discharge around incision site)   Complete by: As directed    Diet - low sodium heart healthy   Complete by: As directed    Discharge instructions   Complete by: As directed    Radial Site Care Refer to this sheet in the next few weeks. These instructions provide you with information on caring for yourself after your procedure. Your caregiver may also give you more specific instructions. Your treatment has been planned according to current medical practices, but problems sometimes occur. Call your caregiver if you have any problems or questions after your procedure. HOME CARE INSTRUCTIONS You may shower the day after the procedure. Remove the bandage (dressing) and gently wash the site with plain soap and water. Gently pat the site dry.  Do not apply powder or lotion to the site.  Do not submerge the affected site in water for 3 to 5 days.  Inspect the site at least twice daily.  Do not flex or bend the affected arm for 24 hours.  No lifting over 5 pounds (2.3 kg) for 5 days after your procedure.  Do not drive home if you are discharged the same day of the procedure. Have someone else drive you.  You may drive 24 hours after the procedure unless otherwise instructed by your caregiver.  What to expect: Any bruising will usually fade within 1 to 2 weeks.  Blood that collects in the tissue (hematoma) may be painful to the touch. It should usually decrease in size and tenderness within 1 to 2 weeks.  SEEK IMMEDIATE MEDICAL CARE IF: You have unusual pain at the radial site.  You have redness, warmth, swelling, or pain at the radial site.  You have drainage (other than a small amount of blood on the dressing).  You have chills.  You have a fever or persistent symptoms for more than 72 hours.    You have a fever and your symptoms suddenly get worse.  Your arm becomes pale, cool, tingly, or numb.  You have heavy bleeding from the site. Hold pressure on the site.   PLEASE DO NOT MISS ANY DOSES OF YOUR PLAVIX!!!!! Also keep a log of you blood pressures and bring back to your follow up appt. Please call the office with any questions.   Patients taking blood thinners should generally stay away from medicines like ibuprofen, Advil, Motrin, naproxen, and Aleve due to risk of stomach bleeding. You may take Tylenol as directed or talk to your primary doctor about alternatives.  You will only be on aspirin for one month, and then plan to stop. Continue taking plavix and Eliquis thereafter.   If you notice any bleeding such as blood in stool, black tarry stools, blood in urine, nosebleeds or any other unusual bleeding, call your  doctor immediately. It is not normal to have this kind of bleeding while on a blood thinner and usually indicates there is an underlying problem with one of your body systems that needs to be checked out.   Increase activity slowly   Complete by: As directed        Discharge Medications   Allergies as of 05/17/2020       Reactions   Amlodipine Besylate Other (See Comments)   REACTION: tingling in lips & gum edema   Levaquin [levofloxacin] Shortness Of Breath, Swelling   angioedema   Lobster [shellfish Allergy] Other (See Comments)   angioedema   Penicillins Rash   Has patient had a PCN reaction causing immediate rash, facial/tongue/throat swelling, SOB or lightheadedness with hypotension: Yes Has patient had a PCN reaction causing severe rash involving mucus membranes or skin necrosis: No Has patient had a PCN reaction that required hospitalization: No Has patient had a PCN reaction occurring within the last 10 years: No If all of the above answers are "NO", then may proceed with Cephalosporin use.   Valsartan Other (See Comments)   REACTION: angioedema    Codeine Other (See Comments)   Mental status changes   Lipitor [atorvastatin] Other (See Comments)   weakness   Oxycodone Other (See Comments)   hallucinations   Statins    Made her too weak   Tramadol    Crestor [rosuvastatin Calcium] Rash   Tramadol Hcl Nausea And Vomiting        Medication List     TAKE these medications    acetaminophen 500 MG tablet Commonly known as: TYLENOL Take 1,000 mg by mouth every 6 (six) hours as needed (pain).   aspirin 81 MG EC tablet Take 1 tablet (81 mg total) by mouth daily.   CALCIUM-MAG-VIT C-VIT D PO Take 1 tablet by mouth daily.   carvedilol 6.25 MG tablet Commonly known as: COREG TAKE 1 TABLET BY MOUTH TWO  TIMES DAILY WITH MEALS What changed: See the new instructions.   cholecalciferol 1000 units tablet Commonly known as: VITAMIN D Take 1,000 Units by mouth daily after supper.   clopidogrel 75 MG tablet Commonly known as: PLAVIX Take 1 tablet (75 mg total) by mouth daily with breakfast.   Eliquis 5 MG Tabs tablet Generic drug: apixaban TAKE 1 TABLET BY MOUTH  TWICE DAILY What changed: how much to take   ezetimibe 10 MG tablet Commonly known as: ZETIA Take 1 tablet (10 mg total) by mouth daily.   furosemide 80 MG tablet Commonly known as: LASIX TAKE 1 TABLET BY MOUTH TWO  TIMES DAILY   Jardiance 10 MG Tabs tablet Generic drug: empagliflozin Take 10 mg by mouth daily before breakfast.   multivitamin with minerals Tabs tablet Take 1 tablet by mouth daily. Centrum Silver   nitroGLYCERIN 0.4 MG SL tablet Commonly known as: NITROSTAT Place 1 tablet (0.4 mg total) under the tongue every 5 (five) minutes as needed for chest pain. Reported on 01/28/2016   NovoLIN 70/30 ReliOn (70-30) 100 UNIT/ML injection Generic drug: insulin NPH-regular Human Inject 31 Units into the skin daily with breakfast. And syringes 1/day   OneTouch Delica Plus 0000000 Misc USE TO MONITOR GLUCOSE  LEVELS TWICE DAILY   OneTouch Verio  test strip Generic drug: glucose blood Use to monitor glucose levels 2 times daily   potassium chloride SA 20 MEQ tablet Commonly known as: KLOR-CON TAKE 2 TABLETS BY MOUTH  DAILY What changed:  how much to take when  to take this   Repatha SureClick [id number] MG/ML Soaj Generic drug: Evolocumab Inject 1 Dose into the skin every 14 (fourteen) days.   Spiriva HandiHaler 18 MCG inhalation capsule Generic drug: tiotropium Place 1 capsule (18 mcg total) into inhaler and inhale daily.         Aspirin prescribed at discharge?  Yes High Intensity Statin Prescribed? (Lipitor 40-80mg  or Crestor 20-40mg ): No: on Repatha Beta Blocker Prescribed? Yes For EF <40%, was ACEI/ARB Prescribed? No: EF ok ADP Receptor Inhibitor Prescribed? (i.e. Plavix etc.-Includes Medically Managed Patients): Yes For EF <40%, Aldosterone Inhibitor Prescribed? No: N/a Was EF assessed during THIS hospitalization? Yes Was Cardiac Rehab II ordered? (Included Medically managed Patients): Yes   Outstanding Labs/Studies   N/a   Duration of Discharge Encounter   Greater than 30 minutes including physician time.  Signed, Reino Bellis NP-C 05/17/2020, 12:54 PM   Agree with above. See my rounding note. She is ready for d.c with close outpatient f/u.  Glori Bickers, MD  10:50 AM

## 2020-05-17 NOTE — Progress Notes (Signed)
CARDIAC REHAB PHASE I   PRE:  Rate/Rhythm: NSR/ST 94-101    BP: sitting 152/91 LT forarm    SaO2: 93 RA  MODE:  Ambulation: 400 ft   POST:  Rate/Rhythm: ST with frequent PACs/occ PVCs 122 max ambulatory rate, 107                                                 resting rate    BP: sitting 136/101 LT forarm     SaO2: 95 RA  0950-1040 Patient ambulated in hallway with RW independently. 3 standing restbreaks and 1 sitting RB for significant shortness of breath. Patient coached through pursed lip breathing. Continuous ambulatory sats monitored (see above). No desaturation noted. Patient denied c/o chest pain or pressure. Post ambulation patient back to sitting upright in bed. Discharge education completed and MI booklet reviewed for NSTEMI along with activity progression/exercise and endpoints, stent card given, NTG education, diet/nutrition. Patient recently moved to retirement community that does not provide special meals for chronic conditions and patient concerned her DM/CAD will not be managed through diet. Transportation will be a barrier to participation in [facility] secondary to medical transportation only provided on T/Th. Patient may be candidate for PR secondary to pulmonary HTN and classes meet on T/Th. Patient agreeable to referral to CR. Referral placed and barriers will be addressed at time of contact.  Lucille Witts Minus Breeding RN, BSN

## 2020-05-19 ENCOUNTER — Ambulatory Visit: Payer: Medicare Other | Admitting: Internal Medicine

## 2020-05-19 MED FILL — Heparin Sod (Porcine)-NaCl IV Soln 1000 Unit/500ML-0.9%: INTRAVENOUS | Qty: 1000 | Status: AC

## 2020-05-20 ENCOUNTER — Other Ambulatory Visit: Payer: Self-pay | Admitting: *Deleted

## 2020-05-20 ENCOUNTER — Telehealth (HOSPITAL_COMMUNITY): Payer: Self-pay

## 2020-05-20 ENCOUNTER — Other Ambulatory Visit (HOSPITAL_COMMUNITY): Payer: Medicare Other

## 2020-05-20 NOTE — Telephone Encounter (Signed)
Pt insurance is active and benefits verified through [facility] Medicare. Co-pay $0.00, DED $0.00/$0.00 met, out of pocket $4,500.00/$666.51 met, co-insurance 0%. No pre-authorization required. Passport, 05/20/20 @ 11:47AM, VVO#[id number]  Will contact patient to see if she is interested in the Cardiac Rehab Program. If interested, patient will need to complete follow up appt. Once completed, patient will be contacted for scheduling upon review by the RN Navigator.

## 2020-05-20 NOTE — Telephone Encounter (Signed)
Called patient to see if she is interested in the Cardiac Rehab Program. Patient expressed interest. Explained scheduling process and went over insurance, patient verbalized understanding. Will contact patient for scheduling once f/u has been completed.  Adv pt she will need to contact Dr. Haroldine Laws office to schedule her f/u appt. Placed ppw in "needs appt" bin.

## 2020-05-20 NOTE — Patient Outreach (Signed)
[facility]) Care Management  05/20/2020  ESTELA BECKENDORF [birth year] SF:[id number]   EMMI-GENERAL DISCHARGE-SUCCESSFUL RED ON EMMI ALERT Day #1 Date: 05/19/2020 Red Alert Reason: No follow up appointment  Outreach #1 RN spoke with pt concerning the EMMI noted above. Pt states she has contacted Dr. Clayborne Dana office and currently awaiting a call back. RN offered to send a notification to Dr. Clayborne Dana office also. Pt states she needs some DME also. RN encouraged pt to await a call back however if not response over the next 24 hours to follow up with another call.   Plan: Based upon the pending call back and notification sent by this RN case manager will close this EMMI.  Raina Mina, RN Care Management Coordinator [place] Office [phone]

## 2020-05-23 ENCOUNTER — Ambulatory Visit: Payer: Medicare Other | Admitting: Pulmonary Disease

## 2020-05-28 ENCOUNTER — Ambulatory Visit: Payer: Medicare Other | Admitting: Pulmonary Disease

## 2020-05-29 ENCOUNTER — Telehealth: Payer: Self-pay | Admitting: Internal Medicine

## 2020-05-29 NOTE — Progress Notes (Signed)
°  Chronic Care Management   Outreach Note  05/29/2020 Name: [name] MRN: [id number] DOB: 04/24/1931  Referred by: [name], MD Reason for referral : No chief complaint on file.   An unsuccessful telephone outreach was attempted today. The patient was referred to the pharmacist for assistance with care management and care coordination.   This note is not being shared with the patient for the following reason: To respect privacy (The patient or proxy has requested that the information not be shared).  Follow Up Plan:   Earney Hamburg Upstream Scheduler

## 2020-05-30 DIAGNOSIS — G4733 Obstructive sleep apnea (adult) (pediatric): Secondary | ICD-10-CM | POA: Diagnosis not present

## 2020-06-03 ENCOUNTER — Telehealth: Payer: Self-pay | Admitting: Internal Medicine

## 2020-06-03 DIAGNOSIS — I4811 Longstanding persistent atrial fibrillation: Secondary | ICD-10-CM

## 2020-06-03 NOTE — Progress Notes (Signed)
°  Chronic Care Management   Outreach Note  06/03/2020 Name: GRACEE RATTERREE MRN: [id number] DOB: Jan 19, 1931  Referred by: [name], MD Reason for referral : No chief complaint on file.   An unsuccessful telephone outreach was attempted today. The patient was referred to the pharmacist for assistance with care management and care coordination.   This note is not being shared with the patient for the following reason: To respect privacy (The patient or proxy has requested that the information not be shared).  Follow Up Plan:   Earney Hamburg Upstream Scheduler

## 2020-06-04 ENCOUNTER — Telehealth: Payer: Self-pay | Admitting: Internal Medicine

## 2020-06-04 NOTE — Progress Notes (Signed)
°  Chronic Care Management   Note  06/04/2020 Name: [name] MRN: [id number] DOB: 18-May-1931  [name] is a 86 y.o. year old female who is a primary care patient of Burns, [name], MD. I reached out to [name] by phone today in response to a referral sent by [name]'s PCP, [name], MD.   [name] was given information about Chronic Care Management services today including:  1. CCM service includes personalized support from designated clinical staff supervised by her physician, including individualized plan of care and coordination with other care providers 2. 24/7 contact phone numbers for assistance for urgent and routine care needs. 3. Service will only be billed when office clinical staff spend 20 minutes or more in a month to coordinate care. 4. Only one practitioner may furnish and bill the service in a calendar month. 5. The patient may stop CCM services at any time (effective at the end of the month) by phone call to the office staff.   Patient agreed to services and verbal consent obtained.   This note is not being shared with the patient for the following reason: To respect privacy (The patient or proxy has requested that the information not be shared).  Follow up plan:   Earney Hamburg Upstream Scheduler

## 2020-06-09 ENCOUNTER — Telehealth (HOSPITAL_COMMUNITY): Payer: Self-pay | Admitting: Licensed Clinical Social Worker

## 2020-06-09 ENCOUNTER — Ambulatory Visit (HOSPITAL_COMMUNITY)
Admission: RE | Admit: 2020-06-09 | Discharge: 2020-06-09 | Disposition: A | Discharge status: Home / Self Care | Payer: Medicare Other | Source: Ambulatory Visit | Attending: Internal Medicine | Admitting: Internal Medicine

## 2020-06-09 ENCOUNTER — Encounter (HOSPITAL_COMMUNITY): Payer: Self-pay

## 2020-06-09 ENCOUNTER — Encounter (HOSPITAL_COMMUNITY): Payer: Self-pay | Admitting: Internal Medicine

## 2020-06-09 ENCOUNTER — Other Ambulatory Visit: Payer: Self-pay

## 2020-06-09 VITALS — BP 150/70 | HR 86 | Wt 166.8 lb

## 2020-06-09 DIAGNOSIS — Z888 Allergy status to other drugs, medicaments and biological substances status: Secondary | ICD-10-CM | POA: Insufficient documentation

## 2020-06-09 DIAGNOSIS — Z794 Long term (current) use of insulin: Secondary | ICD-10-CM | POA: Diagnosis not present

## 2020-06-09 DIAGNOSIS — I4891 Unspecified atrial fibrillation: Secondary | ICD-10-CM | POA: Diagnosis not present

## 2020-06-09 DIAGNOSIS — Z7982 Long term (current) use of aspirin: Secondary | ICD-10-CM | POA: Insufficient documentation

## 2020-06-09 DIAGNOSIS — I4892 Unspecified atrial flutter: Secondary | ICD-10-CM | POA: Diagnosis not present

## 2020-06-09 DIAGNOSIS — I10 Essential (primary) hypertension: Secondary | ICD-10-CM | POA: Diagnosis not present

## 2020-06-09 DIAGNOSIS — J441 Chronic obstructive pulmonary disease with (acute) exacerbation: Secondary | ICD-10-CM | POA: Insufficient documentation

## 2020-06-09 DIAGNOSIS — Z7901 Long term (current) use of anticoagulants: Secondary | ICD-10-CM | POA: Insufficient documentation

## 2020-06-09 DIAGNOSIS — Z885 Allergy status to narcotic agent status: Secondary | ICD-10-CM | POA: Insufficient documentation

## 2020-06-09 DIAGNOSIS — E1151 Type 2 diabetes mellitus with diabetic peripheral angiopathy without gangrene: Secondary | ICD-10-CM | POA: Insufficient documentation

## 2020-06-09 DIAGNOSIS — I5042 Chronic combined systolic (congestive) and diastolic (congestive) heart failure: Secondary | ICD-10-CM | POA: Diagnosis not present

## 2020-06-09 DIAGNOSIS — Z87891 Personal history of nicotine dependence: Secondary | ICD-10-CM | POA: Insufficient documentation

## 2020-06-09 DIAGNOSIS — I110 Hypertensive heart disease with heart failure: Secondary | ICD-10-CM | POA: Diagnosis not present

## 2020-06-09 DIAGNOSIS — Z8249 Family history of ischemic heart disease and other diseases of the circulatory system: Secondary | ICD-10-CM | POA: Insufficient documentation

## 2020-06-09 DIAGNOSIS — Z9989 Dependence on other enabling machines and devices: Secondary | ICD-10-CM | POA: Diagnosis not present

## 2020-06-09 DIAGNOSIS — I5032 Chronic diastolic (congestive) heart failure: Secondary | ICD-10-CM | POA: Diagnosis not present

## 2020-06-09 DIAGNOSIS — Z881 Allergy status to other antibiotic agents status: Secondary | ICD-10-CM | POA: Insufficient documentation

## 2020-06-09 DIAGNOSIS — Z880 Allergy status to penicillin: Secondary | ICD-10-CM | POA: Diagnosis not present

## 2020-06-09 DIAGNOSIS — M159 Polyosteoarthritis, unspecified: Secondary | ICD-10-CM | POA: Diagnosis not present

## 2020-06-09 DIAGNOSIS — Z7902 Long term (current) use of antithrombotics/antiplatelets: Secondary | ICD-10-CM | POA: Diagnosis not present

## 2020-06-09 DIAGNOSIS — I480 Paroxysmal atrial fibrillation: Secondary | ICD-10-CM | POA: Insufficient documentation

## 2020-06-09 DIAGNOSIS — I252 Old myocardial infarction: Secondary | ICD-10-CM | POA: Diagnosis not present

## 2020-06-09 DIAGNOSIS — Z8261 Family history of arthritis: Secondary | ICD-10-CM | POA: Insufficient documentation

## 2020-06-09 DIAGNOSIS — Z955 Presence of coronary angioplasty implant and graft: Secondary | ICD-10-CM | POA: Diagnosis not present

## 2020-06-09 DIAGNOSIS — E782 Mixed hyperlipidemia: Secondary | ICD-10-CM | POA: Diagnosis not present

## 2020-06-09 DIAGNOSIS — Z79899 Other long term (current) drug therapy: Secondary | ICD-10-CM | POA: Diagnosis not present

## 2020-06-09 DIAGNOSIS — I2510 Atherosclerotic heart disease of native coronary artery without angina pectoris: Secondary | ICD-10-CM | POA: Diagnosis not present

## 2020-06-09 DIAGNOSIS — E1121 Type 2 diabetes mellitus with diabetic nephropathy: Secondary | ICD-10-CM

## 2020-06-09 DIAGNOSIS — N1831 Chronic kidney disease, stage 3a: Secondary | ICD-10-CM

## 2020-06-09 DIAGNOSIS — I2721 Secondary pulmonary arterial hypertension: Secondary | ICD-10-CM | POA: Insufficient documentation

## 2020-06-09 DIAGNOSIS — E785 Hyperlipidemia, unspecified: Secondary | ICD-10-CM | POA: Diagnosis not present

## 2020-06-09 DIAGNOSIS — G4733 Obstructive sleep apnea (adult) (pediatric): Secondary | ICD-10-CM | POA: Insufficient documentation

## 2020-06-09 DIAGNOSIS — Z833 Family history of diabetes mellitus: Secondary | ICD-10-CM | POA: Insufficient documentation

## 2020-06-09 LAB — CBC
HCT: 50.6 % — ABNORMAL HIGH (ref 36.0–46.0)
Hemoglobin: 15.9 g/dL — ABNORMAL HIGH (ref 12.0–15.0)
MCH: 30.3 pg (ref 26.0–34.0)
MCHC: 31.4 g/dL (ref 30.0–36.0)
MCV: 96.4 fL (ref 80.0–100.0)
Platelets: 197 10*3/uL (ref 150–400)
RBC: 5.25 MIL/uL — ABNORMAL HIGH (ref 3.87–5.11)
RDW: 13.1 % (ref 11.5–15.5)
WBC: 5.2 10*3/uL (ref 4.0–10.5)
nRBC: 0 % (ref 0.0–0.2)

## 2020-06-09 LAB — BASIC METABOLIC PANEL
Anion gap: 13 (ref 5–15)
BUN: 23 mg/dL (ref 8–23)
CO2: 25 mmol/L (ref 22–32)
Calcium: 9.8 mg/dL (ref 8.9–10.3)
Chloride: 102 mmol/L (ref 98–111)
Creatinine, Ser: 1.23 mg/dL — ABNORMAL HIGH (ref 0.44–1.00)
GFR calc Af Amer: 46 mL/min — ABNORMAL LOW (ref >60)
GFR calc non Af Amer: 40 mL/min — ABNORMAL LOW (ref >60)
Glucose, Bld: 233 mg/dL — ABNORMAL HIGH (ref 70–99)
Potassium: 4.2 mmol/L (ref 3.5–5.1)
Sodium: 140 mmol/L (ref 135–145)

## 2020-06-09 LAB — BRAIN NATRIURETIC PEPTIDE: B Natriuretic Peptide: 225.6 pg/mL — ABNORMAL HIGH (ref 0.0–100.0)

## 2020-06-09 MED ORDER — AMLODIPINE BESYLATE 5 MG PO TABS
5.0000 mg | ORAL_TABLET | Freq: Every day | ORAL | 6 refills | Status: DC
Start: 2020-06-09 — End: 2020-08-12

## 2020-06-09 NOTE — Progress Notes (Signed)
Advanced Heart Failure Clinic Note   Date:  06/09/2020   ID:  [name], DOB 1931-05-07, MRN [id number]  Location: Home  Provider location: [place] Type of Visit: Established patient  PCP:  [name], MD  Cardiologist:  No primary care provider on file. Primary HF: Kilian Schwartz  Chief Complaint: Heart Failure follow-up   History of Present Illness:  [name] is a 86 y.o. female with CAD s/p 1v stent, HTN, atrial fibrillation/A flutter and chronic prior systolic heart failure, (which was likely rate related) and diastolic heart failure. She was referred by Dr. Lovena Le for further evaluation of Pulmonary HTN.   Underwent 2D echo in 3/15 which showed EF 60-65% mild LVH. RV was normal. No significant valvular abnormalities.   Underwent R/L cath 05/07/14 Which showed stable CAD and significant PH with normal PVR.   In 4/21 saw Dr. Scot Dock and had LE angio with severe PAD underwent stenting of 70% R external iliac  Admitted 05/21/17-05/27/17 with COPD exacerbation, acute on chronic diastolic CHF. Diuresed 5L with IV Lasix. Discharge weight 173 pounds.  Myoview 9/20   Nuclear stress EF: 56%.  There was no ST segment deviation noted during stress.  Defect 1: There is a medium defect of moderate severity present in the mid inferolateral, apical inferior and apical lateral location.  Findings consistent with possible mild ischemia in the apical inferior and inferolateral regions.  The left ventricular ejection fraction is normal (55-65%).    Admitted in 5/21 with NSTEMI hstrop peak 1,455. Echo 5/21: EF 50-55%  Cath 05/16/20 with  1. Severe 3 vessel obstructive CAD 2. Severe pulmonary HTN 3. Successful PCI of the proximal LCx/OM1 with DES x 1 with IVUS guidance 4. Successful PCI of the proximal LAD with orbital atherectomy and DES x 1  Ao = 172/68 (108) LV = 166/10 RA = 6 RV = 75/6 PA = 77/26 (45) PCW = 10 Fick cardiac  output/index = 4.0/2.2 PVR = 8.8 WU FA sat = 99% PA sat = 67%, 73%  She presents today for post-hospital f/u. Feels good. No CP. Still with some SOB. Able to ADLs. No orthopnea or PND. + LE edema. R>L     Past Medical History:  Diagnosis Date  . Anemia    iron defficiency  . Anginal pain ([place])   . Arthritis    "knees, feet, hands; joints" (05/27/2015)  . Asthma   . Atrial fibrillation or flutter    s/p RFCA 7/08;   s/p DCCV in past;   previously on amiodarone;  amio stopped due to lung toxicity  . CAD (coronary artery disease)    s/p NSTEMI tx with BMS to OM1 3/08;  cath 3/08: pOM 99% tx with PCI, pLAD 20%, ? mod stenosis at the AM  . CAP (community acquired pneumonia) 05/24/2015  . Chronic diastolic heart failure (HCC)    echo 11/11:  EF 55-60%, severe LVH, mod LAE, mild MR, mildly increased PASP  . COPD (chronic obstructive pulmonary disease) ([place])   . Degenerative joint disease   . Dystrophy, corneal stromal   . Heart murmur   . HLD (hyperlipidemia)   . HTN (hypertension)    essential nos  . Hypopotassemia    PMH of  . Muscle pain   . Myocardial infarction ([place]) 2008  . OSA on CPAP   . Osteoporosis   . Pneumonia 05/27/2015  . Protein calorie malnutrition ([place])   . Rash and nonspecific  skin eruption    both arms,awaiting bio   dr Delman Cheadle  . Seasonal allergies   . Shortness of breath dyspnea   . Type II diabetes mellitus ([place])    Dr Loanne Drilling   Past Surgical History:  Procedure Laterality Date  . A FLUTTER ABLATION     Dr Lovena Le  . ABDOMINAL AORTOGRAM W/LOWER EXTREMITY Bilateral 04/11/2020   Procedure: ABDOMINAL AORTOGRAM W/LOWER EXTREMITY;  Surgeon: Angelia Mould, MD;  Location: [place] [facility];  Service: Cardiovascular;  Laterality: Bilateral;  . CATARACT EXTRACTION W/ INTRAOCULAR LENS  IMPLANT, BILATERAL Bilateral   . CHOLECYSTECTOMY    . COLONOSCOPY  6/07   2 polyps Dr. Kinnie Feil in [place]  . colonoscopy with polypectomy      X 2; [facility]  . CORNEAL  TRANSPLANT Bilateral   . CORONARY ANGIOPLASTY WITH STENT PLACEMENT  02/2007   BMS w/Dr Lovena Le  . CORONARY ATHERECTOMY N/A 05/16/2020   Procedure: CORONARY ATHERECTOMY;  Surgeon: Martinique, Peter M, MD;  Location: [place] [facility];  Service: Cardiovascular;  Laterality: N/A;  . CORONARY BALLOON ANGIOPLASTY N/A 05/16/2020   Procedure: CORONARY BALLOON ANGIOPLASTY;  Surgeon: Martinique, Peter M, MD;  Location: [place] [facility];  Service: Cardiovascular;  Laterality: N/A;  . CORONARY STENT INTERVENTION N/A 05/16/2020   Procedure: CORONARY STENT INTERVENTION;  Surgeon: Martinique, Peter M, MD;  Location: [place] [facility];  Service: Cardiovascular;  Laterality: N/A;  . EYE SURGERY    . gravid 2 para 2    . INTRAVASCULAR ULTRASOUND/IVUS N/A 05/16/2020   Procedure: Intravascular Ultrasound/IVUS;  Surgeon: Martinique, Peter M, MD;  Location: [place] [facility];  Service: Cardiovascular;  Laterality: N/A;  . KNEE CARTILAGE SURGERY Right   . LEFT AND RIGHT HEART CATHETERIZATION WITH CORONARY ANGIOGRAM N/A 05/07/2014   Procedure: LEFT AND RIGHT HEART CATHETERIZATION WITH CORONARY ANGIOGRAM;  Surgeon: Peter M Martinique, MD;  Location: [facility];  Service: Cardiovascular;  Laterality: N/A;  . PERIPHERAL VASCULAR INTERVENTION  04/11/2020   Procedure: PERIPHERAL VASCULAR INTERVENTION;  Surgeon: Angelia Mould, MD;  Location: [place] [facility];  Service: Cardiovascular;;  . RIGHT/LEFT HEART CATH AND CORONARY ANGIOGRAPHY N/A 05/16/2020   Procedure: RIGHT/LEFT HEART CATH AND CORONARY ANGIOGRAPHY;  Surgeon: Jolaine Artist, MD;  Location: [place] [facility];  Service: Cardiovascular;  Laterality: N/A;  . TOTAL KNEE ARTHROPLASTY Right 06/28/2016   Procedure: TOTAL KNEE ARTHROPLASTY;  Surgeon: Frederik Pear, MD;  Location: [place];  Service: Orthopedics;  Laterality: Right;     Current Outpatient Medications  Medication Sig Dispense Refill  . acetaminophen (TYLENOL) 500 MG tablet Take 1,000 mg by mouth every 6 (six) hours  as needed (pain).    Marland Kitchen apixaban (ELIQUIS) 5 MG TABS tablet Take 5 mg by mouth 2 (two) times daily.    Marland Kitchen aspirin EC 81 MG EC tablet Take 1 tablet (81 mg total) by mouth daily. 30 tablet 0  . CALCIUM-MAG-VIT C-VIT D PO Take 1 tablet by mouth daily.    . carvedilol (COREG) 6.25 MG tablet Take 6.25 mg by mouth 2 (two) times daily with a meal.    . cholecalciferol (VITAMIN D) 1000 units tablet Take 1,000 Units by mouth daily after supper.    . clopidogrel (PLAVIX) 75 MG tablet Take 1 tablet (75 mg total) by mouth daily with breakfast. 90 tablet 2  . empagliflozin (JARDIANCE) 10 MG TABS tablet Take 10 mg by mouth daily before breakfast. 90 tablet 3  . Evolocumab (REPATHA SURECLICK) 026  MG/ML SOAJ Inject 1 Dose into the skin every 14 (fourteen) days. 2 pen 11  . furosemide (LASIX) 80 MG tablet TAKE 1 TABLET BY MOUTH TWO  TIMES DAILY 180 tablet 3  . glucose blood (ONETOUCH VERIO) test strip Use to monitor glucose levels 2 times daily 200 each 5  . insulin NPH-regular Human (NOVOLIN 70/30 RELION) (70-30) 100 UNIT/ML injection Inject 31 Units into the skin daily with breakfast. And syringes 1/day 10 mL 11  . Lancets (ONETOUCH DELICA PLUS [id number]) MISC USE TO MONITOR GLUCOSE  LEVELS TWICE DAILY 200 each 3  . Multiple Vitamin (MULTIVITAMIN WITH MINERALS) TABS tablet Take 1 tablet by mouth daily. Centrum Silver    . nitroGLYCERIN (NITROSTAT) 0.4 MG SL tablet Place 1 tablet (0.4 mg total) under the tongue every 5 (five) minutes as needed for chest pain. Reported on 01/28/2016 30 tablet 3  . potassium chloride SA (KLOR-CON) 20 MEQ tablet Take 20 mEq by mouth 2 (two) times daily.    Marland Kitchen tiotropium (SPIRIVA HANDIHALER) 18 MCG inhalation capsule Place 1 capsule (18 mcg total) into inhaler and inhale daily. 90 capsule 1  . ezetimibe (ZETIA) 10 MG tablet Take 1 tablet (10 mg total) by mouth daily. 90 tablet 3   No current facility-administered medications for this encounter.    Allergies:   Amlodipine besylate,  Levaquin [levofloxacin], Lobster [shellfish allergy], Penicillins, Valsartan, Codeine, Lipitor [atorvastatin], Oxycodone, Statins, Tramadol, Crestor [rosuvastatin calcium], and Tramadol hcl   Social History:  The patient  reports that she quit smoking about 51 years ago. Her smoking use included cigarettes. She has a 4.50 pack-year smoking history. She has never used smokeless tobacco. She reports current alcohol use. She reports that she does not use drugs.   Family History:  The patient's family history includes Arthritis in her father, maternal aunt, and mother; Breast cancer in her maternal aunt; Diabetes in her mother; Heart attack (age of onset: 24) in her father; Hypertension in her father and mother; Transient ischemic attack in her mother.   ROS:  Please see the history of present illness.   All other systems are personally reviewed and negative.   Vitals:   06/09/20 1041  BP: (!) 150/70  Pulse: 86  SpO2: 95%  Weight: 75.7 kg (166 lb 12.8 oz)    Exam:   General:  Well appearing. No resp difficulty HEENT: normal Neck: supple. no JVD. Carotids 2+ bilat; no bruits. No lymphadenopathy or thryomegaly appreciated. Cor: PMI nondisplaced. Irregular rate & rhythm. No rubs, gallops or murmurs. Lungs: clear Abdomen: soft, nontender, nondistended. No hepatosplenomegaly. No bruits or masses. Good bowel sounds. Extremities: no cyanosis, clubbing, rash, tr edema. + venous en Neuro: alert & orientedx3, cranial nerves grossly intact. moves all 4 extremities w/o difficulty. Affect pleasant   Recent Labs: 02/20/2020: ALT 21 05/15/2020: B Natriuretic Peptide 269.0; Magnesium 1.9; TSH 0.866 05/17/2020: BUN 20; Creatinine, Ser 1.07; Hemoglobin 15.0; Platelets 170; Potassium 4.8; Sodium 139  Personally reviewed   Wt Readings from Last 3 Encounters:  06/09/20 75.7 kg (166 lb 12.8 oz)  05/17/20 76.5 kg (168 lb 9.6 oz)  05/06/20 75.3 kg (166 lb)    AFL 82 lateral ST depression Personally  reviewed   ASSESSMENT AND PLAN:   1) CAD - H/o of stent in 2008 - Recent admit with NSTEMI 5/21. Cath as above. S/p PCI to LAD & OM - No current s/s angina - On ASA, Plavix, apixaban for 30 days. Drop ASA after 30 days - Unable to tolerate  statins. On Repatha/zetia - Follows with Lipid Clinic - Refer CR  2) PAD - severe  - s/p stenting to R external iliac with Dr. Scot Dock - no current claudication or wounds  3) PAH - Moderate to severe by cath 5/21. PVR 8.8 - suspect multifactorial  - now using CPAP - consider trial of low-dose sildenafil at next visit  4) Chronic diastolic CHF:  - Echo 06/1218 EF 60-65%, LA severely dilated, Echo 10/2017: EF 65-70%, grade 3 DD - Echo 5/21 EF 50-55% - Stable NYHA II-III - Volume status ok. Weight stable.  - Continue lasix 80 mg BID. Can take extra as needed - Continue KCL 40 bid - I wanted to start Entresto today but has h/o angioedema with ARB  5) Paroxysmal atrial fib/flutter - Back in AFL today. Rate controlled. Asx This patients CHA2DS2-VASc Score and unadjusted Ischemic Stroke Rate (% per year) is equal to 7.2 % stroke rate/year from a score of 5 Above score calculated as 2 points each if present [Age > 75, or Stroke/TIA/TE] - Continue Eliquis. No bleeding.  6) OSA/COPD - Follows with Dr. Halford Chessman.  - Now wearing CPAP.   7) HTN - Remains elevated.  - Start amlodipine 5  8) DM2 - Continue Jardiance 5mg  daily (unable to tolerate 10mg  daily)  9) HL  - Unable to tolerate statins. On Repatha/Zetia - LDL 207 in 8/20 - Follows with Lipid Clinic  Total time spent 35 minutes. Over half that time spent discussing above.    Signed, Glori Bickers, MD  06/09/2020 10:43 AM  Advanced Heart Failure [address] [place] [id number] [phone] (office) [phone] (fax)

## 2020-06-09 NOTE — Telephone Encounter (Signed)
CSW consulted to help pt with transportation to and from cardiac rehab as well as assess for other transportation needs.  CSW attempted to call pt to discuss but unable to reach- left VM requesting return call  Jorge Ny, [place] [facility] Desk#: [phone] Cell#: [phone]

## 2020-06-09 NOTE — Patient Instructions (Addendum)
Start Amlodipine 5 mg daily  Stop Aspirin  Labs done today, your results will be available in MyChart, we will contact you for abnormal readings.  You have been referred to Cardiac Rehab, they will call you to schedule an appointment  Your physician recommends that you schedule a follow-up appointment in: 4-8 weeks  If you have any questions or concerns before your next appointment please send [place] a message through [place] or call our office at [phone].    TO LEAVE A MESSAGE FOR THE NURSE SELECT OPTION 2, PLEASE LEAVE A MESSAGE INCLUDING: . YOUR NAME . DATE OF BIRTH . CALL BACK NUMBER . REASON FOR CALL**this is important as we prioritize the call backs  [place] AS LONG AS YOU CALL BEFORE 4:00 PM  At the [facility], you and your health needs are our priority. As part of our continuing mission to provide you with exceptional heart care, we have created designated Provider Care Teams. These Care Teams include your primary Cardiologist (physician) and Advanced Practice Providers (APPs- Physician Assistants and Nurse Practitioners) who all work together to provide you with the care you need, when you need it.   You may see any of the following providers on your designated Care Team at your next follow up: Marland Kitchen Dr Glori Bickers . Dr Loralie Champagne . Darrick Grinder, NP . Lyda Jester, PA . Audry Riles, PharmD   Please be sure to bring in all your medications bottles to every appointment.

## 2020-06-09 NOTE — Progress Notes (Signed)
Medication Samples have been provided to the patient.  Drug name: Eliquis       Strength: 5 mg        Qty: 2  LOT: [id number]  Exp.Date: 4/23  Dosing instructions: Take 1 tab Twice daily   The patient has been instructed regarding the correct time, dose, and frequency of taking this medication, including desired effects and most common side effects.   Jorma Tassinari 11:43 AM 06/09/2020

## 2020-06-11 ENCOUNTER — Telehealth (HOSPITAL_COMMUNITY): Payer: Self-pay | Admitting: Licensed Clinical Social Worker

## 2020-06-11 NOTE — Telephone Encounter (Signed)
CSW attempted to call pt to discuss transport to cardiac rehab- unable to reach- left VM requesting return call  Jorge Ny, [place] [facility] Desk#: [phone] Cell#: [phone]

## 2020-06-11 NOTE — Telephone Encounter (Signed)
CSW received return call from pt to discuss transportation barriers.  Pt reports she lives in [place] community and doesn't have a car so it would be hard to get to and from cardiac rehab.  Her community provides transport to medical appts a few days a week and to other community needs a few days a week but would not be able to accommodate the frequent transport needs for cardiac rehab.  CSW explained that cardiac rehab will help people with [facility] program to get to and from their appts with them and CSW sent a message to cardiac rehab staff to inform of pt need.  CSW encouraged pt to reach back out to me if they are unable to assist and we would figure out a way to assist  Will continue to follow through clinic and assist as needed  Jorge Ny, [place] [facility] Desk#: [phone] Cell#: [phone]

## 2020-06-12 ENCOUNTER — Telehealth (HOSPITAL_COMMUNITY): Payer: Self-pay

## 2020-06-12 ENCOUNTER — Encounter (HOSPITAL_COMMUNITY): Payer: Self-pay

## 2020-06-12 NOTE — Telephone Encounter (Signed)
Attempted to call patient in regards to Cardiac Rehab - LM on VM Mailed letter 

## 2020-06-14 DIAGNOSIS — G4733 Obstructive sleep apnea (adult) (pediatric): Secondary | ICD-10-CM | POA: Diagnosis not present

## 2020-06-16 ENCOUNTER — Encounter (HOSPITAL_COMMUNITY): Payer: Self-pay

## 2020-07-02 DIAGNOSIS — J189 Pneumonia, unspecified organism: Secondary | ICD-10-CM | POA: Diagnosis not present

## 2020-07-02 DIAGNOSIS — G4733 Obstructive sleep apnea (adult) (pediatric): Secondary | ICD-10-CM | POA: Diagnosis not present

## 2020-07-02 DIAGNOSIS — Z5189 Encounter for other specified aftercare: Secondary | ICD-10-CM | POA: Diagnosis not present

## 2020-07-02 DIAGNOSIS — I5033 Acute on chronic diastolic (congestive) heart failure: Secondary | ICD-10-CM | POA: Diagnosis not present

## 2020-07-03 ENCOUNTER — Other Ambulatory Visit: Payer: Self-pay

## 2020-07-03 ENCOUNTER — Ambulatory Visit: Payer: Medicare Other | Admitting: Internal Medicine

## 2020-07-03 ENCOUNTER — Encounter: Payer: Self-pay | Admitting: Internal Medicine

## 2020-07-03 VITALS — BP 150/62 | HR 68 | Ht 63.0 in | Wt 170.2 lb

## 2020-07-03 DIAGNOSIS — E1121 Type 2 diabetes mellitus with diabetic nephropathy: Secondary | ICD-10-CM

## 2020-07-03 DIAGNOSIS — R6889 Other general symptoms and signs: Secondary | ICD-10-CM | POA: Diagnosis not present

## 2020-07-03 DIAGNOSIS — E782 Mixed hyperlipidemia: Secondary | ICD-10-CM

## 2020-07-03 DIAGNOSIS — M791 Myalgia, unspecified site: Secondary | ICD-10-CM

## 2020-07-03 DIAGNOSIS — N1831 Chronic kidney disease, stage 3a: Secondary | ICD-10-CM

## 2020-07-03 DIAGNOSIS — I2510 Atherosclerotic heart disease of native coronary artery without angina pectoris: Secondary | ICD-10-CM

## 2020-07-03 DIAGNOSIS — Z794 Long term (current) use of insulin: Secondary | ICD-10-CM

## 2020-07-03 DIAGNOSIS — T466X5A Adverse effect of antihyperlipidemic and antiarteriosclerotic drugs, initial encounter: Secondary | ICD-10-CM

## 2020-07-03 NOTE — Patient Instructions (Signed)
Medication Instructions:  Your physician recommends that you continue on your current medications as directed. Please refer to the Current Medication list given to you today.  *If you need a refill on your cardiac medications before your next appointment, please call your pharmacy*   Lab Work: FASTING lipid patient in 6 months to check cholesterol   If you have labs (blood work) drawn today and your tests are completely normal, you will receive your results only by: Marland Kitchen MyChart Message (if you have MyChart) OR . A paper copy in the mail If you have any lab test that is abnormal or we need to change your treatment, we will call you to review the results.   Follow-Up: At [facility], you and your health needs are our priority.  As part of our continuing mission to provide you with exceptional heart care, we have created designated Provider Care Teams.  These Care Teams include your primary Cardiologist (physician) and Advanced Practice Providers (APPs -  Physician Assistants and Nurse Practitioners) who all work together to provide you with the care you need, when you need it.  We recommend signing up for the patient portal called "MyChart".  Sign up information is provided on this After Visit Summary.  MyChart is used to connect with patients for Virtual Visits (Telemedicine).  Patients are able to view lab/test results, encounter notes, upcoming appointments, etc.  Non-urgent messages can be sent to your provider as well.   To learn more about what you can do with MyChart, go to [email or web address].    Your next appointment:   6 month(s) - lipid clinic  The format for your next appointment:   In Person  Provider:   K. Mali Hilty, MD   Other Instructions

## 2020-07-03 NOTE — Progress Notes (Signed)
LIPID CLINIC CONSULT NOTE  Chief Complaint:  Follow-up dyslipidemia  Primary Care Physician: [name], MD  Primary Cardiologist:  Pixie Casino, MD  HPI:  [name] is a 86 y.o. female who is being seen today for the evaluation of dyslipidemia at the request of Burns, Stacy J, MD.  This is a pleasant 86 year old female with a history of CAD and a single-vessel stent, hypertension, A. fib/flutter and history of systolic and diastolic heart failure.  She was recently seen by Dr. Ronna Polio for pulmonary hypertension.  She was noted to have a high LDL cholesterol and has been unable to tolerate statins.  She was also started on Jardiance and referred to the lipid clinic for evaluation of PCSK9 inhibitor therapy.  Her most recent labs as of August 2020 showed total cholesterol 290, triglycerides 135, HDL 56 and LDL of 207.  Most of her LDLs have ranged in the 200s, suggesting significant possibility of a familial hyperlipidemia.  In fact there is a family history of heart disease in multiple family members, however her presentation is atypical without aggressive early onset coronary disease is typically 1 would expect.  She does report somewhat of Southern diet with increased cholesterol intake including eating daily eggs, cheese, butter and fish including shellfish and shrimp.  01/30/2020  [name] returns today for follow-up.  She has stopped statin therapy and transition to [place].  She reports no difficulty with the injections.  She denies any myalgias.  Her cholesterol has improved significantly.  Total is now 213 with an LDL of 140, this is down from 207 previously.  Also it is noted that her glycemic control is not optimal and she has had some weight gain and decrease in activity therefore her cholesterol may actually improve if we can improve on some of these parameters.  That being said, she remains still well above target LDL less than 70.  It may not be possible to  achieve that without the ability to use a statin.  07/03/2020  [name] is seen today in follow-up.  The last few months have been eventful for her.  She was admitted in May 2021 with chest pain concerning for unstable angina.  Cardiac cath showed severe three-vessel coronary disease with EF 65% and severe pulmonary arterial hypertension.  CIS of the circumflex/OM1 with drug-eluting stent and LAD with orbital atherectomy.  In addition she was found to have significant lower extremity arterial disease.  She underwent abdominal aortogram with lower extremity runoff by Dr. Doren Custard indicating mild to moderate diffuse renal artery disease and severe diffuse disease of the hypogastric artery.  There was a 70% right external iliac artery stenosis which was treated with balloon angioplasty.  From a lipid standpoint, she has had significant improvement in her numbers.  Her LDL cholesterol in August 2020 was 207, then improved to 140 (on Repatha) and after recent addition of ezetimibe, her LDL is now 96.  Unfortunately she is statin intolerant which limits our options for further improvement.  One option would be bempedoic acid.  I do think though she could make more progress with significant dietary changes and more weight loss if her physical activity improves.  She apparently is going to start cardiac rehabilitation.  PMHx:  Past Medical History:  Diagnosis Date  . Anemia    iron defficiency  . Anginal pain ([place])   . Arthritis    "knees, feet, hands; joints" (05/27/2015)  . Asthma   . Atrial  fibrillation or flutter    s/p RFCA 7/08;   s/p DCCV in past;   previously on amiodarone;  amio stopped due to lung toxicity  . CAD (coronary artery disease)    s/p NSTEMI tx with BMS to OM1 3/08;  cath 3/08: pOM 99% tx with PCI, pLAD 20%, ? mod stenosis at the AM  . CAP (community acquired pneumonia) 05/24/2015  . Chronic diastolic heart failure (HCC)    echo 11/11:  EF 55-60%, severe LVH, mod LAE, mild MR, mildly  increased PASP  . COPD (chronic obstructive pulmonary disease) ([place])   . Degenerative joint disease   . Dystrophy, corneal stromal   . Heart murmur   . HLD (hyperlipidemia)   . HTN (hypertension)    essential nos  . Hypopotassemia    PMH of  . Muscle pain   . Myocardial infarction ([place]) 2008  . OSA on CPAP   . Osteoporosis   . Pneumonia 05/27/2015  . Protein calorie malnutrition ([place])   . Rash and nonspecific skin eruption    both arms,awaiting bio   dr Delman Cheadle  . Seasonal allergies   . Shortness of breath dyspnea   . Type II diabetes mellitus ([place])    Dr Loanne Drilling    Past Surgical History:  Procedure Laterality Date  . A FLUTTER ABLATION     Dr Lovena Le  . ABDOMINAL AORTOGRAM W/LOWER EXTREMITY Bilateral 04/11/2020   Procedure: ABDOMINAL AORTOGRAM W/LOWER EXTREMITY;  Surgeon: Angelia Mould, MD;  Location: [facility];  Service: Cardiovascular;  Laterality: Bilateral;  . CATARACT EXTRACTION W/ INTRAOCULAR LENS  IMPLANT, BILATERAL Bilateral   . CHOLECYSTECTOMY    . COLONOSCOPY  6/07   2 polyps Dr. Kinnie Feil in [place]  . colonoscopy with polypectomy      X 2; [facility]  . CORNEAL TRANSPLANT Bilateral   . CORONARY ANGIOPLASTY WITH STENT PLACEMENT  02/2007   BMS w/Dr Lovena Le  . CORONARY ATHERECTOMY N/A 05/16/2020   Procedure: CORONARY ATHERECTOMY;  Surgeon: Martinique, Peter M, MD;  Location: [facility];  Service: Cardiovascular;  Laterality: N/A;  . CORONARY BALLOON ANGIOPLASTY N/A 05/16/2020   Procedure: CORONARY BALLOON ANGIOPLASTY;  Surgeon: Martinique, Peter M, MD;  Location: [facility];  Service: Cardiovascular;  Laterality: N/A;  . CORONARY STENT INTERVENTION N/A 05/16/2020   Procedure: CORONARY STENT INTERVENTION;  Surgeon: Martinique, Peter M, MD;  Location: [facility];  Service: Cardiovascular;  Laterality: N/A;  . EYE SURGERY    . gravid 2 para 2    . INTRAVASCULAR ULTRASOUND/IVUS N/A 05/16/2020   Procedure: Intravascular Ultrasound/IVUS;  Surgeon:  Martinique, Peter M, MD;  Location: [facility];  Service: Cardiovascular;  Laterality: N/A;  . KNEE CARTILAGE SURGERY Right   . LEFT AND RIGHT HEART CATHETERIZATION WITH CORONARY ANGIOGRAM N/A 05/07/2014   Procedure: LEFT AND RIGHT HEART CATHETERIZATION WITH CORONARY ANGIOGRAM;  Surgeon: Peter M Martinique, MD;  Location: [facility];  Service: Cardiovascular;  Laterality: N/A;  . PERIPHERAL VASCULAR INTERVENTION  04/11/2020   Procedure: PERIPHERAL VASCULAR INTERVENTION;  Surgeon: Angelia Mould, MD;  Location: [facility];  Service: Cardiovascular;;  . RIGHT/LEFT HEART CATH AND CORONARY ANGIOGRAPHY N/A 05/16/2020   Procedure: RIGHT/LEFT HEART CATH AND CORONARY ANGIOGRAPHY;  Surgeon: Jolaine Artist, MD;  Location: [facility];  Service: Cardiovascular;  Laterality: N/A;  . TOTAL KNEE ARTHROPLASTY Right 06/28/2016   Procedure: TOTAL KNEE ARTHROPLASTY;  Surgeon: Frederik Pear, MD;  Location: [facility];  Service: Orthopedics;  Laterality: Right;    FAMHx:  Family History  Problem Relation Age of Onset  . Diabetes Mother   . Hypertension Mother   . Transient ischemic attack Mother   . Arthritis Mother   . Heart attack Father 8  . Arthritis Father   . Hypertension Father   . Breast cancer Maternal Aunt   . Arthritis Maternal Aunt     SOCHx:   reports that she quit smoking about 51 years ago. Her smoking use included cigarettes. She has a 4.50 pack-year smoking history. She has never used smokeless tobacco. She reports current alcohol use. She reports that she does not use drugs.  ALLERGIES:  Allergies  Allergen Reactions  . Amlodipine Besylate Other (See Comments)    REACTION: tingling in lips & gum edema  . Levaquin [Levofloxacin] Shortness Of Breath and Swelling    angioedema  . Lobster [Shellfish Allergy] Other (See Comments)    angioedema  . Penicillins Rash    Has patient had a PCN reaction causing immediate rash, facial/tongue/throat swelling, SOB or  lightheadedness with hypotension: Yes Has patient had a PCN reaction causing severe rash involving mucus membranes or skin necrosis: No Has patient had a PCN reaction that required hospitalization: No Has patient had a PCN reaction occurring within the last 10 years: No If all of the above answers are "NO", then may proceed with Cephalosporin use.  . Valsartan Other (See Comments)    REACTION: angioedema  . Codeine Other (See Comments)    Mental status changes  . Lipitor [Atorvastatin] Other (See Comments)    weakness  . Oxycodone Other (See Comments)    hallucinations  . Statins     Made her too weak  . Tramadol   . Crestor [Rosuvastatin Calcium] Rash  . Tramadol Hcl Nausea And Vomiting    ROS: Pertinent items noted in HPI and remainder of comprehensive ROS otherwise negative.  HOME MEDS: Current Outpatient Medications on File Prior to Visit  Medication Sig Dispense Refill  . acetaminophen (TYLENOL) 500 MG tablet Take 1,000 mg by mouth every 6 (six) hours as needed (pain).    Marland Kitchen amLODipine (NORVASC) 5 MG tablet Take 1 tablet (5 mg total) by mouth daily. 30 tablet 6  . apixaban (ELIQUIS) 5 MG TABS tablet Take 5 mg by mouth 2 (two) times daily.    Marland Kitchen CALCIUM-MAG-VIT C-VIT D PO Take 1 tablet by mouth daily.    . carvedilol (COREG) 6.25 MG tablet Take 6.25 mg by mouth 2 (two) times daily with a meal.    . cholecalciferol (VITAMIN D) 1000 units tablet Take 1,000 Units by mouth daily after supper.    . clopidogrel (PLAVIX) 75 MG tablet Take 1 tablet (75 mg total) by mouth daily with breakfast. 90 tablet 2  . empagliflozin (JARDIANCE) 10 MG TABS tablet Take 10 mg by mouth daily before breakfast. 90 tablet 3  . Evolocumab (REPATHA SURECLICK) 170 MG/ML SOAJ Inject 1 Dose into the skin every 14 (fourteen) days. 2 pen 11  . furosemide (LASIX) 80 MG tablet TAKE 1 TABLET BY MOUTH TWO  TIMES DAILY 180 tablet 3  . glucose blood (ONETOUCH VERIO) test strip Use to monitor glucose levels 2 times  daily 200 each 5  . insulin NPH-regular Human (NOVOLIN 70/30 RELION) (70-30) 100 UNIT/ML injection Inject 31 Units into the skin daily with breakfast. And syringes 1/day 10 mL 11  . Lancets (ONETOUCH DELICA PLUS [id number]) MISC USE TO MONITOR GLUCOSE  LEVELS TWICE  DAILY 200 each 3  . Multiple Vitamin (MULTIVITAMIN WITH MINERALS) TABS tablet Take 1 tablet by mouth daily. Centrum Silver    . nitroGLYCERIN (NITROSTAT) 0.4 MG SL tablet Place 1 tablet (0.4 mg total) under the tongue every 5 (five) minutes as needed for chest pain. Reported on 01/28/2016 30 tablet 3  . potassium chloride SA (KLOR-CON) 20 MEQ tablet Take 20 mEq by mouth 2 (two) times daily.    Marland Kitchen tiotropium (SPIRIVA HANDIHALER) 18 MCG inhalation capsule Place 1 capsule (18 mcg total) into inhaler and inhale daily. 90 capsule 1  . ezetimibe (ZETIA) 10 MG tablet Take 1 tablet (10 mg total) by mouth daily. 90 tablet 3   No current facility-administered medications on file prior to visit.    LABS/IMAGING: No results found. However, due to the size of the patient record, not all encounters were searched. Please check Results Review for a complete set of results. No results found.  LIPID PANEL:    Component Value Date/Time   CHOL 164 05/16/2020 0435   TRIG 90 05/16/2020 0435   TRIG 52 11/10/2006 0845   HDL 50 05/16/2020 0435   CHOLHDL 3.3 05/16/2020 0435   VLDL 18 05/16/2020 0435   LDLCALC 96 05/16/2020 0435   LDLDIRECT 144.6 02/03/2010 0838    WEIGHTS: Wt Readings from Last 3 Encounters:  07/03/20 170 lb 3.2 oz (77.2 kg)  06/09/20 166 lb 12.8 oz (75.7 kg)  05/17/20 168 lb 9.6 oz (76.5 kg)    VITALS: BP (!) 150/62   Pulse 68   Ht 5\' 3"  (1.6 m)   Wt 170 lb 3.2 oz (77.2 kg)   SpO2 90%   BMI 30.15 kg/m   EXAM: Deferred  EKG: Deferred  ASSESSMENT: 1. Mixed dyslipidemia, LDL greater than 190-possible FH 2. History of coronary disease status post PCI 3. Statin intolerance 4. Type 2  diabetes 5. Hypertension  PLAN: 1.   [name] has had significant improvement in LDL cholesterol now down to 96 from 207.  We could consider adding bempedoic acid to her treatment as she cannot tolerate statins due to myalgias, but I think we ought to try to work to further optimize diet, weight loss and more activity now that her PAD has been addressed.  She will start cardiac rehabilitation.  She is concerned about the diet options at [place] states.  They apparently offer very little for diabetics as far as a heart healthy diet.  She is going to try to address that in an upcoming meeting.  We will plan to repeat lipid in about 6 months.  At that time if we have not achieved significant reduction in cholesterol, then I would recommend adding bempedoic acid or combination with Zetia (Nexlizet).  Follow-up with me in 6 months.  Pixie Casino, MD, [facility], [place] Director of the Advanced Lipid Disorders &  Cardiovascular Risk Reduction Clinic Diplomate of the American Board of Clinical Lipidology Attending Cardiologist  Direct Dial: [phone]  Fax: [phone]  Website:  [email or web address]  [name] [name] 07/03/2020, 9:10 AM

## 2020-07-07 ENCOUNTER — Encounter: Payer: Self-pay | Admitting: Internal Medicine

## 2020-07-07 ENCOUNTER — Telehealth (HOSPITAL_COMMUNITY): Payer: Self-pay

## 2020-07-07 ENCOUNTER — Telehealth (HOSPITAL_COMMUNITY): Payer: Self-pay | Admitting: Student-PharmD

## 2020-07-07 ENCOUNTER — Telehealth: Payer: Self-pay | Admitting: Internal Medicine

## 2020-07-07 NOTE — Telephone Encounter (Signed)
Called the transportation services for them to set up pt on 7/22 for them to pick her up for cardiac rehab. They will give her a call.

## 2020-07-07 NOTE — Telephone Encounter (Signed)
°   JERUSALEN MATEJA DOB: 1931/03/22 MRN: [id number]   RIDER WAIVER AND RELEASE OF LIABILITY  For purposes of improving physical access to our facilities, [facility] is pleased to partner with third parties to provide [facility] patients or other authorized individuals the option of convenient, on-demand ground transportation services (the [employer]) through use of the technology service that enables users to request on-demand ground transportation from independent third-party providers.  By opting to use and accept these [employer], I, the undersigned, hereby agree on behalf of myself, and on behalf of any minor child using the [employer] for whom I am the parent or legal guardian, as follows:  1. Government social research officer provided to me are provided by independent third-party transportation providers who are not [employer] or employees and who are unaffiliated with [employer]. 2. [facility] is neither a transportation carrier nor a common or public carrier. 3. [facility] has no control over the quality or safety of the transportation that occurs as a result of the [employer]. 4. [facility] cannot guarantee that any third-party transportation provider will complete any arranged transportation service. 5. [facility] makes no representation, warranty, or guarantee regarding the reliability, timeliness, quality, safety, suitability, or availability of any of the Transport Services or that they will be error free. 6. I fully understand that traveling by vehicle involves risks and dangers of serious bodily injury, including permanent disability, paralysis, and death. I agree, on behalf of myself and on behalf of any minor child using the Transport Services for whom I am the parent or legal guardian, that the entire risk arising out of my use of the [employer] remains solely with me, to the maximum extent permitted under applicable law. 7. The [employer] are provided as is and as available. [facility] disclaims all representations and warranties, express, implied or statutory, not expressly set out in these terms, including the implied warranties of merchantability and fitness for a particular purpose. 8. I hereby waive and release [facility], its agents, employees, officers, directors, representatives, insurers, attorneys, assigns, successors, subsidiaries, and affiliates from any and all past, present, or future claims, demands, liabilities, actions, causes of action, or suits of any kind directly or indirectly arising from acceptance and use of the [employer]. 9. I further waive and release [facility] and its affiliates from all present and future liability and responsibility for any injury or death to persons or damages to property caused by or related to the use of the [employer]. 10. I have read this Waiver and Release of Liability, and I understand the terms used in it and their legal significance. This Waiver is freely and voluntarily given with the understanding that my right (as well as the right of any minor child for whom I am the parent or legal guardian using the [employer]) to legal recourse against [place] in connection with the [employer] is knowingly surrendered in return for use of these services.   I attest that I read the consent document to [name], gave Ms. Mamone the opportunity to ask questions and answered the questions asked (if any). I affirm that [name] then provided consent for she's participation in this program.     Drucie Ip

## 2020-07-07 NOTE — Telephone Encounter (Signed)
Cardiac Rehab Medication Review by a Pharmacist  Does the patient  feel that his/her medications are working for him/her?  YES   Has the patient been experiencing any side effects to the medications prescribed?  NO  Does the patient measure his/her own blood pressure or blood glucose at home?  YES ---BG: this AM 129 ---BP: taken yesterday - 143/62  Does the patient have any problems obtaining medications due to transportation or finances? NO Has patient assistance for Eliquis, Jardiance, Repatha  Understanding of regimen: good Understanding of indications: good Potential of compliance: good   Pharmacist comments: Patient reports she is taking ezetimibe but may be out since prescription is out of refills.  Mercy Riding, PharmD PGY1 Acute Care Pharmacy Resident Contact: [phone]

## 2020-07-14 DIAGNOSIS — G4733 Obstructive sleep apnea (adult) (pediatric): Secondary | ICD-10-CM | POA: Diagnosis not present

## 2020-07-15 ENCOUNTER — Other Ambulatory Visit: Payer: Self-pay

## 2020-07-15 ENCOUNTER — Encounter: Payer: Self-pay | Admitting: Pulmonary Disease

## 2020-07-15 ENCOUNTER — Telehealth (HOSPITAL_COMMUNITY): Payer: Self-pay

## 2020-07-15 ENCOUNTER — Ambulatory Visit: Payer: Medicare Other | Admitting: Pulmonary Disease

## 2020-07-15 VITALS — BP 106/74 | HR 68 | Temp 97.8°F | Ht 63.0 in | Wt 166.4 lb

## 2020-07-15 DIAGNOSIS — G4733 Obstructive sleep apnea (adult) (pediatric): Secondary | ICD-10-CM

## 2020-07-15 DIAGNOSIS — Z9989 Dependence on other enabling machines and devices: Secondary | ICD-10-CM | POA: Diagnosis not present

## 2020-07-15 DIAGNOSIS — J432 Centrilobular emphysema: Secondary | ICD-10-CM | POA: Diagnosis not present

## 2020-07-15 NOTE — Patient Instructions (Signed)
Can try stopping spiriva - call if you notice your breathing gets worse off of spiriva  Follow up in 1 year

## 2020-07-15 NOTE — Telephone Encounter (Signed)
Cardiac Rehab Note:  Unsuccessful telephone encounter to [name] to confirm her cardiac rehab orientation appointment for Thursday 07/17/20 at 8:30 am. [employer] compliant VM message left requesting call back to [phone].  Yigit Norkus E. Rollene Rotunda RN, BSN [place]  Cardiac and Pulmonary Rehabilitation Phone: [phone] Fax: [phone]

## 2020-07-15 NOTE — Progress Notes (Signed)
[facility]  Chief Complaint  Patient presents with  . Follow-up    Breathing is unchanged since the last visit. She has been using her CPAP for approx 2 months. She states the mask is uncomfortable.     Constitutional:  BP 106/74 (BP Location: Left Arm, Cuff Size: Normal)   Pulse 68   Temp 97.8 F (36.6 C) (Oral)   Ht 5\' 3"  (1.6 m)   Wt 166 lb 6.4 oz (75.5 kg)   SpO2 94% Comment: on RA  BMI 29.48 kg/m   Past Medical History:  A fib, CAD, HTN, HLD, Chronic diastolic dysfx, COPD, DM, Allergies  Summary:  [name] is a 86 y.o. female former smoker with obstructive sleep apnea and COPD with emphysema.  Subjective:   She had home sleep study.  Showed moderate OSA.  Restarted CPAP.  Was told she had to use full face mask because she breaths through her mouth.  Sleeping better.  She doesn't feel like spiriva helps.  Not having cough, wheeze, sputum.  She is excited that her son will be coming home from the hospital today after being hospitalized for more than 50 days.   Physical Exam:   Appearance - well kempt   ENMT - no sinus tenderness, no oral exudate, no LAN, Mallampati 3 airway, no stridor  Respiratory - equal breath sounds bilaterally, no wheezing or rales  CV - s1s2 regular rate and rhythm, no murmurs  Ext - no clubbing, no edema  Skin - no rashes  Psych - normal mood and affect   Assessment/Plan:   Obstructive sleep apnea. - she is compliant with CPAP - continue auto CPAP - discussed mask fit options, and explained she could try small mask if she wanted  COPD with emphysema. - minimal symptoms - she doesn't feel spiriva helps and is concerned about cost of medication - will have her stop spiriva  A total of 22 minutes spent addressing patient care issues on day of visit.  Follow up:  Patient Instructions  Can try stopping spiriva - call if you notice your breathing gets worse off of  spiriva  Follow up in 1 year   Signature:  Chesley Mires, MD [place] Pager: [phone] 07/15/2020, 10:46 AM  Flow Sheet     Pulmonary tests:   PFT 05/21/14 >> FEV1 1.37 (94%), FEV1% 66, TLC 4.69 (95%), DLCO 39%  Chest imaging:   CT chest 05/24/15 >> mild centrilobular emphysema  CT chest 05/25/17 >> moderate centrilobular emphysema, atherosclerosis  Sleep tests:   PSG 7/14//15 >> AHI 37  HST 02/21/17 >> AHI 30.5, SaO2 low 78%  Auto CPAP 10/23/17 to 11/21/17 >> used on 30 of 30 nights with average 6 hrs 34 min.  Average AHI 1 with median CPAP 8 and 95 th percentile CPAP 11 cm H2O  HST 03/14/20 >> AHI 15.3, SpO low 79%  Auto CPAP 06/15/20 to 07/14/20 >> used on 30 of 30 nights with average 7 hrs 20 min.  Average AHI 7 with median CPAP 7 and 95 th percentile CPAP 14 cm H2O.  Cardiac tests:   Echo 05/16/20 >> EF 50 to 55%, mod LVH, grade 3 DD, mild MR  Medications:   Allergies as of 07/15/2020      Reactions   Levaquin [levofloxacin] Shortness Of Breath, Swelling   angioedema   Lobster [shellfish Allergy] Other (See Comments)   angioedema   Penicillins Rash   Has patient had a  PCN reaction causing immediate rash, facial/tongue/throat swelling, SOB or lightheadedness with hypotension: Yes Has patient had a PCN reaction causing severe rash involving mucus membranes or skin necrosis: No Has patient had a PCN reaction that required hospitalization: No Has patient had a PCN reaction occurring within the last 10 years: No If all of the above answers are "NO", then may proceed with Cephalosporin use.   Valsartan Other (See Comments)   REACTION: angioedema   Codeine Other (See Comments)   Mental status changes   Lipitor [atorvastatin] Other (See Comments)   weakness   Oxycodone Other (See Comments)   hallucinations   Statins    Made her too weak   Tramadol    Amlodipine Besylate Other (See Comments)   REACTION: tingling in lips & gum edema 7/12:  talked to patient, states she is tolerating well   Crestor [rosuvastatin Calcium] Rash   Tramadol Hcl Nausea And Vomiting      Medication List       Accurate as of July 15, 2020 10:46 AM. If you have any questions, ask your nurse or doctor.        STOP taking these medications   Spiriva HandiHaler 18 MCG inhalation capsule Generic drug: tiotropium Stopped by: Chesley Mires, MD     TAKE these medications   acetaminophen 500 MG tablet Commonly known as: TYLENOL Take 1,000 mg by mouth every 6 (six) hours as needed (pain).   amLODipine 5 MG tablet Commonly known as: NORVASC Take 1 tablet (5 mg total) by mouth daily.   CALCIUM-MAG-VIT C-VIT D PO Take 1 tablet by mouth daily.   carvedilol 6.25 MG tablet Commonly known as: COREG Take 6.25 mg by mouth 2 (two) times daily with a meal.   cholecalciferol 1000 units tablet Commonly known as: VITAMIN D Take 1,000 Units by mouth daily after supper.   clopidogrel 75 MG tablet Commonly known as: PLAVIX Take 1 tablet (75 mg total) by mouth daily with breakfast.   Eliquis 5 MG Tabs tablet Generic drug: apixaban Take 5 mg by mouth 2 (two) times daily.   ezetimibe 10 MG tablet Commonly known as: ZETIA Take 10 mg by mouth daily.   furosemide 80 MG tablet Commonly known as: LASIX TAKE 1 TABLET BY MOUTH TWO  TIMES DAILY   Jardiance 10 MG Tabs tablet Generic drug: empagliflozin Take 10 mg by mouth daily before breakfast.   multivitamin with minerals Tabs tablet Take 1 tablet by mouth daily. Centrum Silver   nitroGLYCERIN 0.4 MG SL tablet Commonly known as: NITROSTAT Place 1 tablet (0.4 mg total) under the tongue every 5 (five) minutes as needed for chest pain. Reported on 01/28/2016   NovoLIN 70/30 ReliOn (70-30) 100 UNIT/ML injection Generic drug: insulin NPH-regular Human Inject 31 Units into the skin daily with breakfast. And syringes 1/day   OneTouch Delica Plus [id number] Misc USE TO MONITOR GLUCOSE  LEVELS TWICE DAILY    OneTouch Verio test strip Generic drug: glucose blood Use to monitor glucose levels 2 times daily   potassium chloride SA 20 MEQ tablet Commonly known as: KLOR-CON Take 20 mEq by mouth 2 (two) times daily.   Repatha SureClick 474 MG/ML Soaj Generic drug: Evolocumab Inject 1 Dose into the skin every 14 (fourteen) days.       Past Surgical History:  She  has a past surgical history that includes Cholecystectomy; Knee cartilage surgery (Right); Eye surgery; A flutter ablation; gravid 2 para 2; Colonoscopy (6/07); Coronary angioplasty with stent (02/2007); colonoscopy  with polypectomy; left and right heart catheterization with coronary angiogram (N/A, 05/07/2014); Cataract extraction w/ intraocular lens  implant, bilateral (Bilateral); Corneal transplant (Bilateral); Total knee arthroplasty (Right, 06/28/2016); ABDOMINAL AORTOGRAM W/LOWER EXTREMITY (Bilateral, 04/11/2020); PERIPHERAL VASCULAR INTERVENTION (04/11/2020); RIGHT/LEFT HEART CATH AND CORONARY ANGIOGRAPHY (N/A, 05/16/2020); CORONARY STENT INTERVENTION (N/A, 05/16/2020); Intravascular Ultrasound/IVUS (N/A, 05/16/2020); CORONARY ATHERECTOMY (N/A, 05/16/2020); and CORONARY BALLOON ANGIOPLASTY (N/A, 05/16/2020).  Family History:  Her family history includes Arthritis in her father, maternal aunt, and mother; Breast cancer in her maternal aunt; Diabetes in her mother; Heart attack (age of onset: 25) in her father; Hypertension in her father and mother; Transient ischemic attack in her mother.  Social History:  She  reports that she quit smoking about 51 years ago. Her smoking use included cigarettes. She has a 4.50 pack-year smoking history. She has never used smokeless tobacco. She reports current alcohol use. She reports that she does not use drugs.

## 2020-07-16 ENCOUNTER — Telehealth (HOSPITAL_COMMUNITY): Payer: Self-pay

## 2020-07-16 ENCOUNTER — Telehealth (HOSPITAL_COMMUNITY): Payer: Self-pay | Admitting: Internal Medicine

## 2020-07-16 NOTE — Telephone Encounter (Signed)
Cardiac Rehab Telephone Note:  Successful telephone encounter to [name] to confirm Cardiac Rehab orientation appointment for 07/17/20 at 0830. Nursing assessment will be completed at orientation. Patient questions answered. Instructions for appointment provided. Patient screening for Covid-19 negative.  October Peery E. Rollene Rotunda RN, BSN [place]. [facility]  Cardiac and Pulmonary Rehabilitation Phone: [phone] Fax: [phone]

## 2020-07-17 ENCOUNTER — Encounter (HOSPITAL_COMMUNITY)
Admission: RE | Admit: 2020-07-17 | Discharge: 2020-07-17 | Disposition: A | Discharge status: Home / Self Care | Payer: Medicare Other | Source: Ambulatory Visit | Attending: Internal Medicine | Admitting: Internal Medicine

## 2020-07-17 ENCOUNTER — Encounter (HOSPITAL_COMMUNITY): Payer: Self-pay

## 2020-07-17 ENCOUNTER — Telehealth (HOSPITAL_COMMUNITY): Payer: Self-pay | Admitting: Internal Medicine

## 2020-07-17 ENCOUNTER — Other Ambulatory Visit: Payer: Self-pay

## 2020-07-17 VITALS — BP 130/62 | HR 70 | Ht 63.0 in | Wt 168.9 lb

## 2020-07-17 DIAGNOSIS — Z955 Presence of coronary angioplasty implant and graft: Secondary | ICD-10-CM | POA: Diagnosis not present

## 2020-07-17 DIAGNOSIS — I214 Non-ST elevation (NSTEMI) myocardial infarction: Secondary | ICD-10-CM | POA: Diagnosis not present

## 2020-07-17 LAB — GLUCOSE, CAPILLARY: Glucose-Capillary: 278 mg/dL — ABNORMAL HIGH (ref 70–99)

## 2020-07-17 NOTE — Progress Notes (Signed)
Cardiac Individual Treatment Plan  Patient Details  Name: [name] MRN: [id number] Date of Birth: Dec 07, 1931 Referring Provider:     [facility] from 07/17/2020 in [facility]  Referring Provider Glori Bickers, MD      Initial Encounter Date:    CARDIAC REHAB PHASE II ORIENTATION from 07/17/2020 in [facility]  Date 07/17/20      Visit Diagnosis: NSTEMI (non-ST elevated myocardial infarction) ([place]) 05/16/20  S/P DES, PCI LCX, LAD 05/16/20  Patient's Home Medications on Admission:  Current Outpatient Medications:  .  acetaminophen (TYLENOL) 500 MG tablet, Take 1,000 mg by mouth every 6 (six) hours as needed (pain)., Disp: , Rfl:  .  amLODipine (NORVASC) 5 MG tablet, Take 1 tablet (5 mg total) by mouth daily., Disp: 30 tablet, Rfl: 6 .  apixaban (ELIQUIS) 5 MG TABS tablet, Take 5 mg by mouth 2 (two) times daily., Disp: , Rfl:  .  CALCIUM-MAG-VIT C-VIT D PO, Take 1 tablet by mouth daily., Disp: , Rfl:  .  carvedilol (COREG) 6.25 MG tablet, Take 6.25 mg by mouth 2 (two) times daily with a meal., Disp: , Rfl:  .  cholecalciferol (VITAMIN D) 1000 units tablet, Take 1,000 Units by mouth daily after supper., Disp: , Rfl:  .  clopidogrel (PLAVIX) 75 MG tablet, Take 1 tablet (75 mg total) by mouth daily with breakfast., Disp: 90 tablet, Rfl: 2 .  empagliflozin (JARDIANCE) 10 MG TABS tablet, Take 10 mg by mouth daily before breakfast., Disp: 90 tablet, Rfl: 3 .  Evolocumab (REPATHA SURECLICK) 235 MG/ML SOAJ, Inject 1 Dose into the skin every 14 (fourteen) days., Disp: 2 pen, Rfl: 11 .  ezetimibe (ZETIA) 10 MG tablet, Take 10 mg by mouth daily., Disp: , Rfl:  .  furosemide (LASIX) 80 MG tablet, TAKE 1 TABLET BY MOUTH TWO  TIMES DAILY, Disp: 180 tablet, Rfl: 3 .  glucose blood (ONETOUCH VERIO) test strip, Use to monitor glucose levels 2 times daily, Disp: 200 each, Rfl: 5 .  insulin NPH-regular Human  (NOVOLIN 70/30 RELION) (70-30) 100 UNIT/ML injection, Inject 31 Units into the skin daily with breakfast. And syringes 1/day, Disp: 10 mL, Rfl: 11 .  Lancets (ONETOUCH DELICA PLUS [id number]) MISC, USE TO MONITOR GLUCOSE  LEVELS TWICE DAILY, Disp: 200 each, Rfl: 3 .  Multiple Vitamin (MULTIVITAMIN WITH MINERALS) TABS tablet, Take 1 tablet by mouth daily. Centrum Silver, Disp: , Rfl:  .  nitroGLYCERIN (NITROSTAT) 0.4 MG SL tablet, Place 1 tablet (0.4 mg total) under the tongue every 5 (five) minutes as needed for chest pain. Reported on 01/28/2016, Disp: 30 tablet, Rfl: 3 .  potassium chloride SA (KLOR-CON) 20 MEQ tablet, Take 20 mEq by mouth 2 (two) times daily., Disp: , Rfl:   Past Medical History: Past Medical History:  Diagnosis Date  . Anemia    iron defficiency  . Anginal pain ([place])   . Arthritis    "knees, feet, hands; joints" (05/27/2015)  . Asthma   . Atrial fibrillation or flutter    s/p RFCA 7/08;   s/p DCCV in past;   previously on amiodarone;  amio stopped due to lung toxicity  . CAD (coronary artery disease)    s/p NSTEMI tx with BMS to OM1 3/08;  cath 3/08: pOM 99% tx with PCI, pLAD 20%, ? mod stenosis at the AM  . CAP (community acquired pneumonia) 05/24/2015  . Chronic diastolic heart failure ([place])  echo 11/11:  EF 55-60%, severe LVH, mod LAE, mild MR, mildly increased PASP  . COPD (chronic obstructive pulmonary disease) ([place])   . Degenerative joint disease   . Dystrophy, corneal stromal   . Heart murmur   . HLD (hyperlipidemia)   . HTN (hypertension)    essential nos  . Hypopotassemia    PMH of  . Muscle pain   . Myocardial infarction ([place]) 2008  . OSA on CPAP   . Osteoporosis   . Pneumonia 05/27/2015  . Protein calorie malnutrition ([place])   . Rash and nonspecific skin eruption    both arms,awaiting bio   dr Delman Cheadle  . Seasonal allergies   . Shortness of breath dyspnea   . Type II diabetes mellitus ([place])    Dr Loanne Drilling    Tobacco Use: Social History   Tobacco  Use  Smoking Status Former Smoker  . Packs/day: 0.25  . Years: 18.00  . Pack years: 4.50  . Types: Cigarettes  . Quit date: 12/27/1968  . Years since quitting: 51.5  Smokeless Tobacco Never Used  Tobacco Comment   smoked [place], up to 1 pp week    Labs: Recent Review Flowsheet Data    Labs for ITP Cardiac and Pulmonary Rehab Latest Ref Rng & Units 05/15/2020 05/16/2020 05/16/2020 05/16/2020 05/16/2020   Cholestrol 0 - 200 mg/dL - 164 - - -   LDLCALC 0 - 99 mg/dL - 96 - - -   LDLDIRECT mg/dL - - - - -   HDL >40 mg/dL - 50 - - -   Trlycerides <150 mg/dL - 90 - - -   Hemoglobin A1c 4.8 - 5.6 % 8.3(H) - - - -   PHART 7.35 - 7.45 - - 7.348(L) - -   PCO2ART 32 - 48 mmHg - - 57.8(H) - -   HCO3 20.0 - 28.0 mmol/L - - 31.8(H) 24.1 31.2(H)   TCO2 22 - 32 mmol/L - - 34(H) 26 33(H)   ACIDBASEDEF 0.0 - 2.0 mmol/L - - - 2.0 -   O2SAT % - - 99.0 73.0 67.0      Capillary Blood Glucose: Lab Results  Component Value Date   GLUCAP 278 (H) 07/17/2020   GLUCAP 317 (H) 05/17/2020   GLUCAP 177 (H) 05/17/2020   GLUCAP 247 (H) 05/16/2020   GLUCAP 222 (H) 05/16/2020     Exercise Target Goals: Exercise Program Goal: Individual exercise prescription set using results from initial 6 min walk test and THRR while considering  patient's activity barriers and safety.   Exercise Prescription Goal: Initial exercise prescription builds to 30-45 minutes a day of aerobic activity, 2-3 days per week.  Home exercise guidelines will be given to patient during program as part of exercise prescription that the participant will acknowledge.  Activity Barriers & Risk Stratification:  Activity Barriers & Cardiac Risk Stratification - 07/17/20 1145      Activity Barriers & Cardiac Risk Stratification   Activity Barriers Back Problems;Right Knee Replacement;Joint Problems;Deconditioning;Shortness of Breath;Balance Concerns;Assistive Device;Other (comment)    Comments PAD, bilateral foot neuropathy, COPD     Cardiac Risk Stratification High           6 Minute Walk:  6 Minute Walk    Row Name 07/17/20 1131         6 Minute Walk   Phase Initial     Distance 600 feet     Walk Time 6 minutes     # of Rest Breaks 2  2 breaks: 2:20-3:33 and 4:55-6:00     MPH 1.13     METS 0.55     RPE 13     Perceived Dyspnea  2     VO2 Peak 1.93     Symptoms Yes (comment)     Comments Right calf pain/burning # 6 (on 0-10 scale), right knee pain, fatigue, SOB (RPD =2)     Resting HR 70 bpm     Resting BP 130/62     Resting Oxygen Saturation  93 %     Exercise Oxygen Saturation  during 6 min walk 92 %     Max Ex. HR 87 bpm     Max Ex. BP 142/70     2 Minute Post BP 130/72       Interval Oxygen   Interval Oxygen? Yes  Pt with h/o COPD, interval SaO2 obtained.     Baseline Oxygen Saturation % 93 %     1 Minute Oxygen Saturation % 93 %     1 Minute Liters of Oxygen 0 L     2 Minute Oxygen Saturation % 92 %     2 Minute Liters of Oxygen 0 L     3 Minute Oxygen Saturation % 94 %     3 Minute Liters of Oxygen 0 L     4 Minute Oxygen Saturation % 92 %     4 Minute Liters of Oxygen 0 L     5 Minute Oxygen Saturation % 93 %     5 Minute Liters of Oxygen 0 L     6 Minute Oxygen Saturation % 93 %     6 Minute Liters of Oxygen 0 L     2 Minute Post Oxygen Saturation % 94 %     2 Minute Post Liters of Oxygen 0 L            Oxygen Initial Assessment:   Oxygen Re-Evaluation:   Oxygen Discharge (Final Oxygen Re-Evaluation):   Initial Exercise Prescription:  Initial Exercise Prescription - 07/17/20 1100      Date of Initial Exercise RX and Referring Provider   Date 07/17/20    Referring Provider Glori Bickers, MD    Expected Discharge Date 09/12/20      NuStep   Level 1    SPM 60    Minutes 25    METs 1.5      Prescription Details   Frequency (times per week) 3    Duration Progress to 30 minutes of continuous aerobic without signs/symptoms of physical distress      Intensity     THRR 40-80% of Max Heartrate 54-107    Ratings of Perceived Exertion 11-13    Perceived Dyspnea 0-4      Progression   Progression Continue progressive overload as per policy without signs/symptoms or physical distress.      Resistance Training   Training Prescription Yes    Weight 1    Reps 10-15           Perform Capillary Blood Glucose checks as needed.  Exercise Prescription Changes:   Exercise Comments:   Exercise Goals and Review:   Exercise Goals    Row Name 07/17/20 1155             Exercise Goals   Increase Physical Activity Yes       Intervention Provide advice, education, support and counseling about physical activity/exercise needs.;Develop an individualized exercise prescription for aerobic and resistive  training based on initial evaluation findings, risk stratification, comorbidities and participant's personal goals.       Expected Outcomes Short Term: Attend rehab on a regular basis to increase amount of physical activity.;Long Term: Add in home exercise to make exercise part of routine and to increase amount of physical activity.;Long Term: Exercising regularly at least 3-5 days a week.       Increase Strength and Stamina Yes       Intervention Provide advice, education, support and counseling about physical activity/exercise needs.;Develop an individualized exercise prescription for aerobic and resistive training based on initial evaluation findings, risk stratification, comorbidities and participant's personal goals.       Expected Outcomes Short Term: Increase workloads from initial exercise prescription for resistance, speed, and METs.;Short Term: Perform resistance training exercises routinely during rehab and add in resistance training at home;Long Term: Improve cardiorespiratory fitness, muscular endurance and strength as measured by increased METs and functional capacity (6MWT)       Able to understand and use rate of perceived exertion (RPE) scale  Yes       Intervention Provide education and explanation on how to use RPE scale       Expected Outcomes Short Term: Able to use RPE daily in rehab to express subjective intensity level;Long Term:  Able to use RPE to guide intensity level when exercising independently       Knowledge and understanding of Target Heart Rate Range (THRR) Yes       Intervention Provide education and explanation of THRR including how the numbers were predicted and where they are located for reference       Expected Outcomes Short Term: Able to state/look up THRR;Long Term: Able to use THRR to govern intensity when exercising independently;Short Term: Able to use daily as guideline for intensity in rehab       Able to check pulse independently Yes       Intervention Provide education and demonstration on how to check pulse in carotid and radial arteries.;Review the importance of being able to check your own pulse for safety during independent exercise       Expected Outcomes Short Term: Able to explain why pulse checking is important during independent exercise;Long Term: Able to check pulse independently and accurately       Understanding of Exercise Prescription Yes       Intervention Provide education, explanation, and written materials on patient's individual exercise prescription       Expected Outcomes Short Term: Able to explain program exercise prescription;Long Term: Able to explain home exercise prescription to exercise independently              Exercise Goals Re-Evaluation :   Discharge Exercise Prescription (Final Exercise Prescription Changes):   Nutrition:  Target Goals: Understanding of nutrition guidelines, daily intake of sodium 1500mg , cholesterol 200mg , calories 30% from fat and 7% or less from saturated fats, daily to have 5 or more servings of fruits and vegetables.  Biometrics:  Pre Biometrics - 07/17/20 0929      Pre Biometrics   Waist Circumference 40 inches    Hip Circumference  46.5 inches    Waist to Hip Ratio 0.86 %    Triceps Skinfold 25 mm    % Body Fat 42.9 %    Grip Strength 17 kg    Flexibility 14.5 in    Single Leg Stand 0 seconds   Not done: Pt uses walker and cane for mobility  Nutrition Therapy Plan and Nutrition Goals:   Nutrition Assessments:   Nutrition Goals Re-Evaluation:   Nutrition Goals Re-Evaluation:   Nutrition Goals Discharge (Final Nutrition Goals Re-Evaluation):   Psychosocial: Target Goals: Acknowledge presence or absence of significant depression and/or stress, maximize coping skills, provide positive support system. Participant is able to verbalize types and ability to use techniques and skills needed for reducing stress and depression.  Initial Review & Psychosocial Screening:  Initial Psych Review & Screening - 07/17/20 1038      Initial Review   Current issues with Current Sleep Concerns;Current Stress Concerns    Source of Stress Concerns Family    Comments Hassan Buckler reports that she gets up at 3am and 5 am every morning and falls back to sleep. [name]'s son has throat cancer and was recently discharged from the hospital after a lengthy admission.      Family Dynamics   Good Support System? Yes      Barriers   Psychosocial barriers to participate in program The patient should benefit from training in stress management and relaxation.      Screening Interventions   Interventions Encouraged to exercise           Quality of Life Scores:  Quality of Life - 07/17/20 1124      Quality of Life   Select Quality of Life      Quality of Life Scores   Health/Function Pre 24 %    Socioeconomic Pre 30 %    Psych/Spiritual Pre 30 %    Family Pre 30 %    GLOBAL Pre 27.29 %          Scores of 19 and below usually indicate a poorer quality of life in these areas.  A difference of  2-3 points is a clinically meaningful difference.  A difference of 2-3 points in the total score of the Quality of Life  Index has been associated with significant improvement in overall quality of life, self-image, physical symptoms, and general health in studies assessing change in quality of life.  PHQ-9: Recent Review Flowsheet Data    Depression screen [facility] 2/9 07/17/2020 10/12/2019 08/25/2018 07/31/2018 12/12/2017   Decreased Interest 0 0 0 0 0   Down, Depressed, Hopeless 0 1 0 0 0   PHQ - 2 Score 0 1 0 0 0     Interpretation of Total Score  Total Score Depression Severity:  1-4 = Minimal depression, 5-9 = Mild depression, 10-14 = Moderate depression, 15-19 = Moderately severe depression, 20-27 = Severe depression   Psychosocial Evaluation and Intervention:   Psychosocial Re-Evaluation:   Psychosocial Discharge (Final Psychosocial Re-Evaluation):   Vocational Rehabilitation: Provide vocational rehab assistance to qualifying candidates.   Vocational Rehab Evaluation & Intervention:  Vocational Rehab - 07/17/20 1225      Initial Vocational Rehab Evaluation & Intervention   Assessment shows need for Vocational Rehabilitation No   [place] is retired and does not need vocational rehab at this time          Education: Education Goals: Education classes will be provided on a weekly basis, covering required topics. Participant will state understanding/return demonstration of topics presented.  Learning Barriers/Preferences:  Learning Barriers/Preferences - 07/17/20 1125      Learning Barriers/Preferences   Learning Barriers Sight   waers glasses   Learning Preferences Pictoral;Video;Computer/Internet           Education Topics: Count Your Pulse:  -Group instruction provided by verbal instruction, demonstration, patient participation and  written materials to support subject.  Instructors address importance of being able to find your pulse and how to count your pulse when at home without a heart monitor.  Patients get hands on experience counting their pulse with staff help and  individually.   Heart Attack, Angina, and Risk Factor Modification:  -Group instruction provided by verbal instruction, video, and written materials to support subject.  Instructors address signs and symptoms of angina and heart attacks.    Also discuss risk factors for heart disease and how to make changes to improve heart health risk factors.   Functional Fitness:  -Group instruction provided by verbal instruction, demonstration, patient participation, and written materials to support subject.  Instructors address safety measures for doing things around the house.  Discuss how to get up and down off the floor, how to pick things up properly, how to safely get out of a chair without assistance, and balance training.   Meditation and Mindfulness:  -Group instruction provided by verbal instruction, patient participation, and written materials to support subject.  Instructor addresses importance of mindfulness and meditation practice to help reduce stress and improve awareness.  Instructor also leads participants through a meditation exercise.    Stretching for Flexibility and Mobility:  -Group instruction provided by verbal instruction, patient participation, and written materials to support subject.  Instructors lead participants through series of stretches that are designed to increase flexibility thus improving mobility.  These stretches are additional exercise for major muscle groups that are typically performed during regular warm up and cool down.   Hands Only CPR:  -Group verbal, video, and participation provides a basic overview of AHA guidelines for community CPR. Role-play of emergencies allow participants the opportunity to practice calling for help and chest compression technique with discussion of AED use.   Hypertension: -Group verbal and written instruction that provides a basic overview of hypertension including the most recent diagnostic guidelines, risk factor reduction with  self-care instructions and medication management.    Nutrition I class: Heart Healthy Eating:  -Group instruction provided by PowerPoint slides, verbal discussion, and written materials to support subject matter. The instructor gives an explanation and review of the Therapeutic Lifestyle Changes diet recommendations, which includes a discussion on lipid goals, dietary fat, sodium, fiber, plant stanol/sterol esters, sugar, and the components of a well-balanced, healthy diet.   Nutrition II class: Lifestyle Skills:  -Group instruction provided by PowerPoint slides, verbal discussion, and written materials to support subject matter. The instructor gives an explanation and review of label reading, grocery shopping for heart health, heart healthy recipe modifications, and ways to make healthier choices when eating out.   Diabetes Question & Answer:  -Group instruction provided by PowerPoint slides, verbal discussion, and written materials to support subject matter. The instructor gives an explanation and review of diabetes co-morbidities, pre- and post-prandial blood glucose goals, pre-exercise blood glucose goals, signs, symptoms, and treatment of hypoglycemia and hyperglycemia, and foot care basics.   Diabetes Blitz:  -Group instruction provided by PowerPoint slides, verbal discussion, and written materials to support subject matter. The instructor gives an explanation and review of the physiology behind type 1 and type 2 diabetes, diabetes medications and rational behind using different medications, pre- and post-prandial blood glucose recommendations and Hemoglobin A1c goals, diabetes diet, and exercise including blood glucose guidelines for exercising safely.    Portion Distortion:  -Group instruction provided by PowerPoint slides, verbal discussion, written materials, and food models to support subject matter. The instructor gives an explanation  of serving size versus portion size, changes in  portions sizes over the last 20 years, and what consists of a serving from each food group.   Stress Management:  -Group instruction provided by verbal instruction, video, and written materials to support subject matter.  Instructors review role of stress in heart disease and how to cope with stress positively.     Exercising on Your Own:  -Group instruction provided by verbal instruction, power point, and written materials to support subject.  Instructors discuss benefits of exercise, components of exercise, frequency and intensity of exercise, and end points for exercise.  Also discuss use of nitroglycerin and activating EMS.  Review options of places to exercise outside of rehab.  Review guidelines for sex with heart disease.   Cardiac Drugs I:  -Group instruction provided by verbal instruction and written materials to support subject.  Instructor reviews cardiac drug classes: antiplatelets, anticoagulants, beta blockers, and statins.  Instructor discusses reasons, side effects, and lifestyle considerations for each drug class.   Cardiac Drugs II:  -Group instruction provided by verbal instruction and written materials to support subject.  Instructor reviews cardiac drug classes: angiotensin converting enzyme inhibitors (ACE-I), angiotensin II receptor blockers (ARBs), nitrates, and calcium channel blockers.  Instructor discusses reasons, side effects, and lifestyle considerations for each drug class.   Anatomy and Physiology of the Circulatory System:  Group verbal and written instruction and models provide basic cardiac anatomy and physiology, with the coronary electrical and arterial systems. Review of: AMI, Angina, Valve disease, Heart Failure, Peripheral Artery Disease, Cardiac Arrhythmia, Pacemakers, and the ICD.   Other Education:  -Group or individual verbal, written, or video instructions that support the educational goals of the cardiac rehab program.   Holiday Eating Survival  Tips:  -Group instruction provided by PowerPoint slides, verbal discussion, and written materials to support subject matter. The instructor gives patients tips, tricks, and techniques to help them not only survive but enjoy the holidays despite the onslaught of food that accompanies the holidays.   Knowledge Questionnaire Score:  Knowledge Questionnaire Score - 07/17/20 1125      Knowledge Questionnaire Score   Pre Score 17/24           Core Components/Risk Factors/Patient Goals at Admission:  Personal Goals and Risk Factors at Admission - 07/17/20 1226      Core Components/Risk Factors/Patient Goals on Admission    Weight Management Yes;Obesity;Weight Loss    Intervention Weight Management: Develop a combined nutrition and exercise program designed to reach desired caloric intake, while maintaining appropriate intake of nutrient and fiber, sodium and fats, and appropriate energy expenditure required for the weight goal.;Weight Management: Provide education and appropriate resources to help participant work on and attain dietary goals.;Weight Management/Obesity: Establish reasonable short term and long term weight goals.;Obesity: Provide education and appropriate resources to help participant work on and attain dietary goals.    Admit Weight 168 lb 14 oz (76.6 kg)    Goal Weight: Long Term 153 lb (69.4 kg)    Expected Outcomes Short Term: Continue to assess and modify interventions until short term weight is achieved;Long Term: Adherence to nutrition and physical activity/exercise program aimed toward attainment of established weight goal;Weight Maintenance: Understanding of the daily nutrition guidelines, which includes 25-35% calories from fat, 7% or less cal from saturated fats, less than 200mg  cholesterol, less than 1.5gm of sodium, & 5 or more servings of fruits and vegetables daily;Weight Loss: Understanding of general recommendations for a balanced deficit meal plan, which  promotes 1-2  lb weight loss per week and includes a negative energy balance of [phone] kcal/d;Understanding recommendations for meals to include 15-35% energy as protein, 25-35% energy from fat, 35-60% energy from carbohydrates, less than 200mg  of dietary cholesterol, 20-35 gm of total fiber daily;Understanding of distribution of calorie intake throughout the day with the consumption of 4-5 meals/snacks    Diabetes Yes    Intervention Provide education about signs/symptoms and action to take for hypo/hyperglycemia.;Provide education about proper nutrition, including hydration, and aerobic/resistive exercise prescription along with prescribed medications to achieve blood glucose in normal ranges: Fasting glucose 65-99 mg/dL    Expected Outcomes Short Term: Participant verbalizes understanding of the signs/symptoms and immediate care of hyper/hypoglycemia, proper foot care and importance of medication, aerobic/resistive exercise and nutrition plan for blood glucose control.;Long Term: Attainment of HbA1C < 7%.    Heart Failure Yes    Intervention Provide a combined exercise and nutrition program that is supplemented with education, support and counseling about heart failure. Directed toward relieving symptoms such as shortness of breath, decreased exercise tolerance, and extremity edema.    Expected Outcomes Short term: Attendance in program 2-3 days a week with increased exercise capacity. Reported lower sodium intake. Reported increased fruit and vegetable intake. Reports medication compliance.;Short term: Daily weights obtained and reported for increase. Utilizing diuretic protocols set by physician.;Long term: Adoption of self-care skills and reduction of barriers for early signs and symptoms recognition and intervention leading to self-care maintenance.    Hypertension Yes    Intervention Provide education on lifestyle modifcations including regular physical activity/exercise, weight management, moderate sodium  restriction and increased consumption of fresh fruit, vegetables, and low fat dairy, alcohol moderation, and smoking cessation.;Monitor prescription use compliance.    Expected Outcomes Short Term: Continued assessment and intervention until BP is < 140/41mm HG in hypertensive participants. < 130/16mm HG in hypertensive participants with diabetes, heart failure or chronic kidney disease.;Long Term: Maintenance of blood pressure at goal levels.    Lipids Yes    Intervention Provide education and support for participant on nutrition & aerobic/resistive exercise along with prescribed medications to achieve LDL 70mg , HDL >40mg .    Expected Outcomes Short Term: Participant states understanding of desired cholesterol values and is compliant with medications prescribed. Participant is following exercise prescription and nutrition guidelines.;Long Term: Cholesterol controlled with medications as prescribed, with individualized exercise RX and with personalized nutrition plan. Value goals: LDL < 70mg , HDL > 40 mg.    Stress Yes    Intervention Offer individual and/or small group education and counseling on adjustment to heart disease, stress management and health-related lifestyle change. Teach and support self-help strategies.    Expected Outcomes Short Term: Participant demonstrates changes in health-related behavior, relaxation and other stress management skills, ability to obtain effective social support, and compliance with psychotropic medications if prescribed.;Long Term: Emotional wellbeing is indicated by absence of clinically significant psychosocial distress or social isolation.           Core Components/Risk Factors/Patient Goals Review:    Core Components/Risk Factors/Patient Goals at Discharge (Final Review):    ITP Comments:  ITP Comments    Row Name 07/17/20 0937           ITP Comments Dr Fransico Him MD, Medical Director              Comments: [name] attended orientation on  07/17/2020 to review rules and guidelines for program.  Completed 6 minute walk test, Intitial ITP, and exercise prescription. Keitra stopped  to take two rest breaks as Greenly is deconditioned. Saxon complained of 6/10 chronic right knee pain and right calf burning during the walk test this resolved with rest. [name] also complained of fatigue and mild shortness of breath. Oxygen saturation 92%- 94% as the patient has COPD. VSS. Telemetry-Atrial Flutter this has been previously documented. Blessyn used a rollator as she walks with a cane and rollator  at home. Safety measures and social distancing in place per CDC guidelines.Barnet Pall, RN,BSN 07/17/2020 12:41 PM

## 2020-07-21 ENCOUNTER — Encounter (HOSPITAL_COMMUNITY)
Admission: RE | Admit: 2020-07-21 | Discharge: 2020-07-21 | Disposition: A | Discharge status: Home / Self Care | Payer: Medicare Other | Source: Ambulatory Visit | Attending: Internal Medicine | Admitting: Internal Medicine

## 2020-07-21 ENCOUNTER — Other Ambulatory Visit: Payer: Self-pay

## 2020-07-21 DIAGNOSIS — I214 Non-ST elevation (NSTEMI) myocardial infarction: Secondary | ICD-10-CM

## 2020-07-21 DIAGNOSIS — Z955 Presence of coronary angioplasty implant and graft: Secondary | ICD-10-CM | POA: Diagnosis not present

## 2020-07-21 LAB — GLUCOSE, CAPILLARY
Glucose-Capillary: 103 mg/dL — ABNORMAL HIGH (ref 70–99)
Glucose-Capillary: 83 mg/dL (ref 70–99)

## 2020-07-21 NOTE — Progress Notes (Signed)
Incomplete Session Note  Patient Details  Name: [name] MRN: [id number] Date of Birth: 1931/06/27 Referring Provider:     [place] from 07/17/2020 in [place]  Referring Provider Glori Bickers, MD      [name] did not complete her rehab session.  CBG 83 this afternoon. Patient asymptomatic. No exercise per protocol. Patient ate a yogurt. [name] had eaten lunch at 12:30. [name] ate baked fish and green beans.  Encouraged patient to eat a snack before coming to exercise. Patient states understanding.  Recheck CBG 103. Blood pressure 142/78. Patient plans to return to exercise on Wednesday.Barnet Pall, RN,BSN 07/21/2020 3:50 PM

## 2020-07-23 ENCOUNTER — Other Ambulatory Visit: Payer: Self-pay

## 2020-07-23 ENCOUNTER — Encounter (HOSPITAL_COMMUNITY)
Admission: RE | Admit: 2020-07-23 | Discharge: 2020-07-23 | Disposition: A | Discharge status: Home / Self Care | Payer: Medicare Other | Source: Ambulatory Visit | Attending: Internal Medicine | Admitting: Internal Medicine

## 2020-07-23 DIAGNOSIS — I214 Non-ST elevation (NSTEMI) myocardial infarction: Secondary | ICD-10-CM | POA: Diagnosis not present

## 2020-07-23 DIAGNOSIS — Z955 Presence of coronary angioplasty implant and graft: Secondary | ICD-10-CM | POA: Diagnosis not present

## 2020-07-23 LAB — GLUCOSE, CAPILLARY
Glucose-Capillary: 181 mg/dL — ABNORMAL HIGH (ref 70–99)
Glucose-Capillary: 104 mg/dL — ABNORMAL HIGH (ref 70–99)

## 2020-07-23 NOTE — Progress Notes (Signed)
Daily Session Note  Patient Details  Name: [name] MRN: [id number] Date of Birth: [birth year] Referring Provider:     [place] from 07/17/2020 in [place]  Referring Provider [name] Bickers, MD      Encounter Date: 07/23/2020  Check In:  Session Check In - 07/23/20 1522      Check-In   Supervising physician immediately available to respond to emergencies [facility] immediately available    Physician(s) Dr. Wyline [name]    Location [facility]    Staff Present [name] Pall, RN, [name] Glazier, MS, EP-C, CCRP;[name] Nevels, MS,ACSM CEP, Exercise Physiologist;[name] Celesta Aver, MS, ACSM CEP, Exercise Physiologist    Virtual Visit No    Medication changes reported     No    Fall or balance concerns reported    No    Tobacco Cessation No Change    Warm-up and Cool-down Performed on first and last piece of equipment    Resistance Training Performed No    VAD Patient? No    PAD/SET Patient? No      Pain Assessment   Currently in Pain? No/denies    Pain Score 0-No pain    Multiple Pain Sites No           Capillary Blood Glucose: Results for orders placed or performed during the hospital encounter of 07/23/20 (from the past 24 hour(s))  Glucose, capillary     Status: Abnormal   Collection Time: 07/23/20  2:59 PM  Result Value Ref Range   Glucose-Capillary 181 (H) 70 - 99 mg/dL  Glucose, capillary     Status: Abnormal   Collection Time: 07/23/20  4:02 PM  Result Value Ref Range   Glucose-Capillary 104 (H) 70 - 99 mg/dL   *Note: Due to a large number of results and/or encounters for the requested time period, some results have not been displayed. A complete set of results can be found in Results Review.     Exercise Prescription Changes - 07/23/20 1615      Response to Exercise   Blood Pressure (Admit) 120/80    Blood Pressure (Exercise) 152/70    Blood Pressure (Exit) 138/70     Heart Rate (Admit) 90 bpm    Heart Rate (Exercise) 97 bpm    Heart Rate (Exit) 88 bpm    Oxygen Saturation (Exercise) 91 %    Rating of Perceived Exertion (Exercise) 13    Perceived Dyspnea (Exercise) 3    Symptoms Pt experienced SOB and took a total of 5 breaks due to SOB. RPD = 3    Comments First day of exericse. Very deconditioned and experiienced SOB and took 5 breaks.     Duration Progress to 30 minutes of  aerobic without signs/symptoms of physical distress    Intensity THRR unchanged      Progression   Progression Continue to progress workloads to maintain intensity without signs/symptoms of physical distress.    Average METs 1.6      Resistance Training   Training Prescription No    Weight --   Weights not done on Wednesdays. Will use 1 lb M-F     Interval Training   Interval Training No      NuStep   Level 1    SPM 70    Minutes 25    METs 1.6           Social History   Tobacco Use  Smoking Status Former  Smoker  . Packs/day: 0.25  . Years: 18.00  . Pack years: 4.50  . Types: Cigarettes  . Quit date: 12/27/1968  . Years since quitting: 51.6  Smokeless Tobacco Never Used  Tobacco Comment   smoked [name], up to 1 pp week    Goals Met:  No report of cardiac concerns or symptoms  Goals Unmet:  Not Applicable  Comments: [name] started cardiac rehab today.  Pt tolerated light exercise fair. [name] is deconditioned and took several rest breaks throughout exercise as [name] did have shortness of breath with exertion.[name] uses a cane for stability.  Oxygen saturation's 91-93% on room air.  . VSS, telemetry-Atrial Fib/Atrial Flutter, asymptomatic.  Medication list reconciled. Pt denies barriers to medicaiton compliance.  PSYCHOSOCIAL ASSESSMENT:  PHQ-0. Pt exhibits positive coping skills, hopeful outlook with supportive family. No psychosocial needs identified at this time, no psychosocial interventions necessary.    Pt enjoys chair yoga and reading.   Pt  oriented to exercise equipment and routine.    Understanding verbalized. [name] has a positive attitude and is looking forward to participating in the program.[name] [name] Maxon, RN,BSN 07/24/2020 8:29 AM    Dr. Fransico Him is Medical Director for Cardiac Rehab at [facility].

## 2020-07-25 ENCOUNTER — Other Ambulatory Visit: Payer: Self-pay

## 2020-07-25 ENCOUNTER — Encounter (HOSPITAL_COMMUNITY)
Admission: RE | Admit: 2020-07-25 | Discharge: 2020-07-25 | Disposition: A | Discharge status: Home / Self Care | Payer: Medicare Other | Source: Ambulatory Visit | Attending: Internal Medicine | Admitting: Internal Medicine

## 2020-07-25 DIAGNOSIS — I214 Non-ST elevation (NSTEMI) myocardial infarction: Secondary | ICD-10-CM | POA: Diagnosis not present

## 2020-07-25 DIAGNOSIS — Z955 Presence of coronary angioplasty implant and graft: Secondary | ICD-10-CM

## 2020-07-28 ENCOUNTER — Encounter (HOSPITAL_COMMUNITY)
Admission: RE | Admit: 2020-07-28 | Discharge: 2020-07-28 | Disposition: A | Discharge status: Home / Self Care | Payer: Medicare Other | Source: Ambulatory Visit | Attending: Internal Medicine | Admitting: Internal Medicine

## 2020-07-28 ENCOUNTER — Other Ambulatory Visit: Payer: Self-pay

## 2020-07-28 VITALS — Wt 168.0 lb

## 2020-07-28 DIAGNOSIS — Z955 Presence of coronary angioplasty implant and graft: Secondary | ICD-10-CM | POA: Insufficient documentation

## 2020-07-28 DIAGNOSIS — I214 Non-ST elevation (NSTEMI) myocardial infarction: Secondary | ICD-10-CM | POA: Diagnosis not present

## 2020-07-28 LAB — GLUCOSE, CAPILLARY: Glucose-Capillary: 275 mg/dL — ABNORMAL HIGH (ref 70–99)

## 2020-07-29 NOTE — Progress Notes (Signed)
[name] 86 y.o. female Nutrition Note  Visit Diagnosis: NSTEMI (non-ST elevated myocardial infarction) ([place]) 05/16/20  S/P DES, PCI LCX, LAD 05/16/20  Past Medical History:  Diagnosis Date  . Anemia    iron defficiency  . Anginal pain ([place])   . Arthritis    "knees, feet, hands; joints" (05/27/2015)  . Asthma   . Atrial fibrillation or flutter    s/p RFCA 7/08;   s/p DCCV in past;   previously on amiodarone;  amio stopped due to lung toxicity  . CAD (coronary artery disease)    s/p NSTEMI tx with BMS to OM1 3/08;  cath 3/08: pOM 99% tx with PCI, pLAD 20%, ? mod stenosis at the AM  . CAP (community acquired pneumonia) 05/24/2015  . Chronic diastolic heart failure (HCC)    echo 11/11:  EF 55-60%, severe LVH, mod LAE, mild MR, mildly increased PASP  . COPD (chronic obstructive pulmonary disease) ([place])   . Degenerative joint disease   . Dystrophy, corneal stromal   . Heart murmur   . HLD (hyperlipidemia)   . HTN (hypertension)    essential nos  . Hypopotassemia    PMH of  . Muscle pain   . Myocardial infarction ([place]) 2008  . OSA on CPAP   . Osteoporosis   . Pneumonia 05/27/2015  . Protein calorie malnutrition ([place])   . Rash and nonspecific skin eruption    both arms,awaiting bio   dr Delman Cheadle  . Seasonal allergies   . Shortness of breath dyspnea   . Type II diabetes mellitus ([place])    Dr Loanne Drilling     Medications reviewed.   Current Outpatient Medications:  .  acetaminophen (TYLENOL) 500 MG tablet, Take 1,000 mg by mouth every 6 (six) hours as needed (pain)., Disp: , Rfl:  .  amLODipine (NORVASC) 5 MG tablet, Take 1 tablet (5 mg total) by mouth daily., Disp: 30 tablet, Rfl: 6 .  apixaban (ELIQUIS) 5 MG TABS tablet, Take 5 mg by mouth 2 (two) times daily., Disp: , Rfl:  .  CALCIUM-MAG-VIT C-VIT D PO, Take 1 tablet by mouth daily., Disp: , Rfl:  .  carvedilol (COREG) 6.25 MG tablet, Take 6.25 mg by mouth 2 (two) times daily with a meal., Disp: , Rfl:  .  cholecalciferol  (VITAMIN D) 1000 units tablet, Take 1,000 Units by mouth daily after supper., Disp: , Rfl:  .  clopidogrel (PLAVIX) 75 MG tablet, Take 1 tablet (75 mg total) by mouth daily with breakfast., Disp: 90 tablet, Rfl: 2 .  empagliflozin (JARDIANCE) 10 MG TABS tablet, Take 10 mg by mouth daily before breakfast., Disp: 90 tablet, Rfl: 3 .  Evolocumab (REPATHA SURECLICK) 154 MG/ML SOAJ, Inject 1 Dose into the skin every 14 (fourteen) days., Disp: 2 pen, Rfl: 11 .  ezetimibe (ZETIA) 10 MG tablet, Take 10 mg by mouth daily., Disp: , Rfl:  .  furosemide (LASIX) 80 MG tablet, TAKE 1 TABLET BY MOUTH TWO  TIMES DAILY, Disp: 180 tablet, Rfl: 3 .  glucose blood (ONETOUCH VERIO) test strip, Use to monitor glucose levels 2 times daily, Disp: 200 each, Rfl: 5 .  insulin NPH-regular Human (NOVOLIN 70/30 RELION) (70-30) 100 UNIT/ML injection, Inject 31 Units into the skin daily with breakfast. And syringes 1/day, Disp: 10 mL, Rfl: 11 .  Lancets (ONETOUCH DELICA PLUS [id number]) MISC, USE TO MONITOR GLUCOSE  LEVELS TWICE DAILY, Disp: 200 each, Rfl: 3 .  Multiple Vitamin (MULTIVITAMIN WITH MINERALS) TABS tablet, Take 1  tablet by mouth daily. Centrum Silver, Disp: , Rfl:  .  nitroGLYCERIN (NITROSTAT) 0.4 MG SL tablet, Place 1 tablet (0.4 mg total) under the tongue every 5 (five) minutes as needed for chest pain. Reported on 01/28/2016, Disp: 30 tablet, Rfl: 3 .  potassium chloride SA (KLOR-CON) 20 MEQ tablet, Take 20 mEq by mouth 2 (two) times daily., Disp: , Rfl:    Ht Readings from Last 1 Encounters:  07/17/20 5\' 3"  (1.6 m)     Wt Readings from Last 3 Encounters:  07/17/20 168 lb 14 oz (76.6 kg)  07/15/20 166 lb 6.4 oz (75.5 kg)  07/03/20 170 lb 3.2 oz (77.2 kg)     There is no height or weight on file to calculate BMI.   Social History   Tobacco Use  Smoking Status Former Smoker  . Packs/day: 0.25  . Years: 18.00  . Pack years: 4.50  . Types: Cigarettes  . Quit date: 12/27/1968  . Years since quitting: 51.6   Smokeless Tobacco Never Used  Tobacco Comment   smoked [place], up to 1 pp week     Lab Results  Component Value Date   CHOL 164 05/16/2020   Lab Results  Component Value Date   HDL 50 05/16/2020   Lab Results  Component Value Date   LDLCALC 96 05/16/2020   Lab Results  Component Value Date   TRIG 90 05/16/2020     Lab Results  Component Value Date   HGBA1C 8.3 (H) 05/15/2020     CBG (last 3)  Recent Labs    07/28/20 1507  GLUCAP 275*     Nutrition Note  Spoke with pt. Nutrition Plan and Nutrition Survey goals reviewed with pt. Pt is following a Heart Healthy diet.   Pt has Type 2 Diabetes. Pt checks CBG's 2 times a day. Fasting CBG's reportedly 100-120 mg/dL. This morning it was 159 mg/dl after having 1 glass of wine last night. She verbalizes wanting to achieve glycemic target of A1C <7%.  Pt also wants to lose weight. She has gained about 10 lbs since moving into a retirement community. She feels she is enjoying more carbs with her meals (ie more desserts). She states the facility provides options for her to make better food choices. We reviewed menu and ways to improve.  B - eats in apartment, cereal/toast with pb/fruit OR fruit smoothie with milk  L - meatloaf, corn, mashed potatoes, salad (heavy meal) D - roast beef with gravy, baked potato, string beans, sherbet   Pt will try to have dessert after lunch instead of lunch/dinner. She will choose just 1 carb at each meal instead of both carb side items (each typically 1/2-1 cup in measurement).   Pt expressed understanding of the information reviewed..   Nutrition Diagnosis ? Excessive carbohydrate intake related to pt preference for carbs and dessert at lunch and dinner as evidenced by A1C 8.3 and fasting CBG 159 mg/dl   Nutrition Intervention ? Pt's individual nutrition plan reviewed with pt. ? Benefits of adopting Heart Healthy diet discussed when Medficts reviewed ? Continue client-centered  nutrition education by RD, as part of interdisciplinary care.  Goal(s) ? Pt to identify food quantities necessary to achieve weight loss of 6-24 lb at graduation from cardiac rehab.  ? CBG concentrations in the normal range or as close to normal as is safely possible. ? Improved blood glucose control as evidenced by pt's A1c trending from 8.3 toward less than 7.0.  Plan:  Will provide client-centered nutrition education as part of interdisciplinary care  Monitor and evaluate progress toward nutrition goal with team.   Michaele Offer, MS, RDN, LDN

## 2020-07-29 NOTE — Progress Notes (Signed)
[name]'s oxygen saturations have been noted between 90-94% on room air during exercise at cardiac rehab. Katriona continues to take rest breaks when exercising on the nustep as needed. Malayia says that she feels stronger since she has been participating in phase 2 cardiac rehab. Upon assessment. Lung fields essentially clear upon ascultation. Slightly diminished breath sounds noted in the posterior bases. No peripheral edema noted. Will notify Dr Haroldine Laws and continue to monitor.Barnet Pall, RN,BSN 07/29/2020 8:05 AM

## 2020-07-30 ENCOUNTER — Ambulatory Visit: Payer: Medicare Other | Admitting: Vascular Surgery

## 2020-07-30 ENCOUNTER — Encounter (HOSPITAL_COMMUNITY): Payer: Medicare Other

## 2020-07-30 ENCOUNTER — Encounter (HOSPITAL_COMMUNITY)
Admission: RE | Admit: 2020-07-30 | Discharge: 2020-07-30 | Disposition: A | Discharge status: Home / Self Care | Payer: Medicare Other | Source: Ambulatory Visit | Attending: Internal Medicine | Admitting: Internal Medicine

## 2020-07-30 ENCOUNTER — Other Ambulatory Visit: Payer: Self-pay

## 2020-07-30 DIAGNOSIS — I214 Non-ST elevation (NSTEMI) myocardial infarction: Secondary | ICD-10-CM | POA: Diagnosis not present

## 2020-07-30 DIAGNOSIS — Z955 Presence of coronary angioplasty implant and graft: Secondary | ICD-10-CM

## 2020-07-31 ENCOUNTER — Telehealth (HOSPITAL_COMMUNITY): Payer: Self-pay | Admitting: Pharmacist

## 2020-07-31 NOTE — Telephone Encounter (Signed)
Sent out of pocket expense report to [employer] for Eliquis patient assistance application.   Audry Riles, PharmD, BCPS, BCCP, CPP [facility] Failure [facility] Pharmacist [phone]

## 2020-08-01 ENCOUNTER — Encounter (HOSPITAL_COMMUNITY)
Admission: RE | Admit: 2020-08-01 | Discharge: 2020-08-01 | Disposition: A | Discharge status: Home / Self Care | Payer: Medicare Other | Source: Ambulatory Visit | Attending: Internal Medicine | Admitting: Internal Medicine

## 2020-08-01 ENCOUNTER — Other Ambulatory Visit: Payer: Self-pay

## 2020-08-01 DIAGNOSIS — I214 Non-ST elevation (NSTEMI) myocardial infarction: Secondary | ICD-10-CM

## 2020-08-01 DIAGNOSIS — Z955 Presence of coronary angioplasty implant and graft: Secondary | ICD-10-CM

## 2020-08-04 ENCOUNTER — Telehealth (HOSPITAL_COMMUNITY): Payer: Self-pay | Admitting: Internal Medicine

## 2020-08-04 ENCOUNTER — Encounter (HOSPITAL_COMMUNITY): Payer: Medicare Other

## 2020-08-06 ENCOUNTER — Encounter (HOSPITAL_COMMUNITY)
Admission: RE | Admit: 2020-08-06 | Discharge: 2020-08-06 | Disposition: A | Discharge status: Home / Self Care | Payer: Medicare Other | Source: Ambulatory Visit | Attending: Internal Medicine | Admitting: Internal Medicine

## 2020-08-06 ENCOUNTER — Other Ambulatory Visit: Payer: Self-pay

## 2020-08-06 DIAGNOSIS — Z955 Presence of coronary angioplasty implant and graft: Secondary | ICD-10-CM | POA: Diagnosis not present

## 2020-08-06 DIAGNOSIS — I214 Non-ST elevation (NSTEMI) myocardial infarction: Secondary | ICD-10-CM | POA: Diagnosis not present

## 2020-08-07 ENCOUNTER — Ambulatory Visit: Payer: Medicare Other | Admitting: Endocrinology

## 2020-08-07 NOTE — Progress Notes (Signed)
Cardiac Individual Treatment Plan  Patient Details  Name: [name] MRN: [id number] Date of Birth: Dec 07, 1931 Referring Provider:     [place] from 07/17/2020 in [facility]  Referring Provider Glori Bickers, MD      Initial Encounter Date:    CARDIAC REHAB PHASE II ORIENTATION from 07/17/2020 in [facility]  Date 07/17/20      Visit Diagnosis: NSTEMI (non-ST elevated myocardial infarction) ([place]) 05/16/20  S/P DES, PCI LCX, LAD 05/16/20  Patient's Home Medications on Admission:  Current Outpatient Medications:  .  acetaminophen (TYLENOL) 500 MG tablet, Take 1,000 mg by mouth every 6 (six) hours as needed (pain)., Disp: , Rfl:  .  amLODipine (NORVASC) 5 MG tablet, Take 1 tablet (5 mg total) by mouth daily., Disp: 30 tablet, Rfl: 6 .  apixaban (ELIQUIS) 5 MG TABS tablet, Take 5 mg by mouth 2 (two) times daily., Disp: , Rfl:  .  CALCIUM-MAG-VIT C-VIT D PO, Take 1 tablet by mouth daily., Disp: , Rfl:  .  carvedilol (COREG) 6.25 MG tablet, Take 6.25 mg by mouth 2 (two) times daily with a meal., Disp: , Rfl:  .  cholecalciferol (VITAMIN D) 1000 units tablet, Take 1,000 Units by mouth daily after supper., Disp: , Rfl:  .  clopidogrel (PLAVIX) 75 MG tablet, Take 1 tablet (75 mg total) by mouth daily with breakfast., Disp: 90 tablet, Rfl: 2 .  empagliflozin (JARDIANCE) 10 MG TABS tablet, Take 10 mg by mouth daily before breakfast., Disp: 90 tablet, Rfl: 3 .  Evolocumab (REPATHA SURECLICK) 235 MG/ML SOAJ, Inject 1 Dose into the skin every 14 (fourteen) days., Disp: 2 pen, Rfl: 11 .  ezetimibe (ZETIA) 10 MG tablet, Take 10 mg by mouth daily., Disp: , Rfl:  .  furosemide (LASIX) 80 MG tablet, TAKE 1 TABLET BY MOUTH TWO  TIMES DAILY, Disp: 180 tablet, Rfl: 3 .  glucose blood (ONETOUCH VERIO) test strip, Use to monitor glucose levels 2 times daily, Disp: 200 each, Rfl: 5 .  insulin NPH-regular Human  (NOVOLIN 70/30 RELION) (70-30) 100 UNIT/ML injection, Inject 31 Units into the skin daily with breakfast. And syringes 1/day, Disp: 10 mL, Rfl: 11 .  Lancets (ONETOUCH DELICA PLUS [id number]) MISC, USE TO MONITOR GLUCOSE  LEVELS TWICE DAILY, Disp: 200 each, Rfl: 3 .  Multiple Vitamin (MULTIVITAMIN WITH MINERALS) TABS tablet, Take 1 tablet by mouth daily. Centrum Silver, Disp: , Rfl:  .  nitroGLYCERIN (NITROSTAT) 0.4 MG SL tablet, Place 1 tablet (0.4 mg total) under the tongue every 5 (five) minutes as needed for chest pain. Reported on 01/28/2016, Disp: 30 tablet, Rfl: 3 .  potassium chloride SA (KLOR-CON) 20 MEQ tablet, Take 20 mEq by mouth 2 (two) times daily., Disp: , Rfl:   Past Medical History: Past Medical History:  Diagnosis Date  . Anemia    iron defficiency  . Anginal pain ([place])   . Arthritis    "knees, feet, hands; joints" (05/27/2015)  . Asthma   . Atrial fibrillation or flutter    s/p RFCA 7/08;   s/p DCCV in past;   previously on amiodarone;  amio stopped due to lung toxicity  . CAD (coronary artery disease)    s/p NSTEMI tx with BMS to OM1 3/08;  cath 3/08: pOM 99% tx with PCI, pLAD 20%, ? mod stenosis at the AM  . CAP (community acquired pneumonia) 05/24/2015  . Chronic diastolic heart failure ([place])  echo 11/11:  EF 55-60%, severe LVH, mod LAE, mild MR, mildly increased PASP  . COPD (chronic obstructive pulmonary disease) ([place])   . Degenerative joint disease   . Dystrophy, corneal stromal   . Heart murmur   . HLD (hyperlipidemia)   . HTN (hypertension)    essential nos  . Hypopotassemia    PMH of  . Muscle pain   . Myocardial infarction ([place]) 2008  . OSA on CPAP   . Osteoporosis   . Pneumonia 05/27/2015  . Protein calorie malnutrition ([place])   . Rash and nonspecific skin eruption    both arms,awaiting bio   dr Delman Cheadle  . Seasonal allergies   . Shortness of breath dyspnea   . Type II diabetes mellitus ([place])    Dr Loanne Drilling    Tobacco Use: Social History   Tobacco  Use  Smoking Status Former Smoker  . Packs/day: 0.25  . Years: 18.00  . Pack years: 4.50  . Types: Cigarettes  . Quit date: 12/27/1968  . Years since quitting: 51.6  Smokeless Tobacco Never Used  Tobacco Comment   smoked [place], up to 1 pp week    Labs: Recent Review Flowsheet Data    Labs for ITP Cardiac and Pulmonary Rehab Latest Ref Rng & Units 05/15/2020 05/16/2020 05/16/2020 05/16/2020 05/16/2020   Cholestrol 0 - 200 mg/dL - 164 - - -   LDLCALC 0 - 99 mg/dL - 96 - - -   LDLDIRECT mg/dL - - - - -   HDL >40 mg/dL - 50 - - -   Trlycerides <150 mg/dL - 90 - - -   Hemoglobin A1c 4.8 - 5.6 % 8.3(H) - - - -   PHART 7.35 - 7.45 - - 7.348(L) - -   PCO2ART 32 - 48 mmHg - - 57.8(H) - -   HCO3 20.0 - 28.0 mmol/L - - 31.8(H) 24.1 31.2(H)   TCO2 22 - 32 mmol/L - - 34(H) 26 33(H)   ACIDBASEDEF 0.0 - 2.0 mmol/L - - - 2.0 -   O2SAT % - - 99.0 73.0 67.0      Capillary Blood Glucose: Lab Results  Component Value Date   GLUCAP 275 (H) 07/28/2020   GLUCAP 104 (H) 07/23/2020   GLUCAP 181 (H) 07/23/2020   GLUCAP 103 (H) 07/21/2020   GLUCAP 83 07/21/2020     Exercise Target Goals: Exercise Program Goal: Individual exercise prescription set using results from initial 6 min walk test and THRR while considering  patient's activity barriers and safety.   Exercise Prescription Goal: Starting with aerobic activity 30 plus minutes a day, 3 days per week for initial exercise prescription. Provide home exercise prescription and guidelines that participant acknowledges understanding prior to discharge.  Activity Barriers & Risk Stratification:  Activity Barriers & Cardiac Risk Stratification - 07/17/20 1145      Activity Barriers & Cardiac Risk Stratification   Activity Barriers Back Problems;Right Knee Replacement;Joint Problems;Deconditioning;Shortness of Breath;Balance Concerns;Assistive Device;Other (comment)    Comments PAD, bilateral foot neuropathy, COPD    Cardiac Risk Stratification  High           6 Minute Walk:  6 Minute Walk    Row Name 07/17/20 1131         6 Minute Walk   Phase Initial     Distance 600 feet     Walk Time 6 minutes     # of Rest Breaks 2  2 breaks: 2:20-3:33 and 4:55-6:00  MPH 1.13     METS 0.55     RPE 13     Perceived Dyspnea  2     VO2 Peak 1.93     Symptoms Yes (comment)     Comments Right calf pain/burning # 6 (on 0-10 scale), right knee pain, fatigue, SOB (RPD =2)     Resting HR 70 bpm     Resting BP 130/62     Resting Oxygen Saturation  93 %     Exercise Oxygen Saturation  during 6 min walk 92 %     Max Ex. HR 87 bpm     Max Ex. BP 142/70     2 Minute Post BP 130/72       Interval Oxygen   Interval Oxygen? Yes  Pt with h/o COPD, interval SaO2 obtained.     Baseline Oxygen Saturation % 93 %     1 Minute Oxygen Saturation % 93 %     1 Minute Liters of Oxygen 0 L     2 Minute Oxygen Saturation % 92 %     2 Minute Liters of Oxygen 0 L     3 Minute Oxygen Saturation % 94 %     3 Minute Liters of Oxygen 0 L     4 Minute Oxygen Saturation % 92 %     4 Minute Liters of Oxygen 0 L     5 Minute Oxygen Saturation % 93 %     5 Minute Liters of Oxygen 0 L     6 Minute Oxygen Saturation % 93 %     6 Minute Liters of Oxygen 0 L     2 Minute Post Oxygen Saturation % 94 %     2 Minute Post Liters of Oxygen 0 L            Oxygen Initial Assessment:   Oxygen Re-Evaluation:   Oxygen Discharge (Final Oxygen Re-Evaluation):   Initial Exercise Prescription:  Initial Exercise Prescription - 07/17/20 1100      Date of Initial Exercise RX and Referring Provider   Date 07/17/20    Referring Provider Glori Bickers, MD    Expected Discharge Date 09/12/20      NuStep   Level 1    SPM 60    Minutes 25    METs 1.5      Prescription Details   Frequency (times per week) 3    Duration Progress to 30 minutes of continuous aerobic without signs/symptoms of physical distress      Intensity   THRR 40-80% of Max  Heartrate 54-107    Ratings of Perceived Exertion 11-13    Perceived Dyspnea 0-4      Progression   Progression Continue progressive overload as per policy without signs/symptoms or physical distress.      Resistance Training   Training Prescription Yes    Weight 1    Reps 10-15           Perform Capillary Blood Glucose checks as needed.  Exercise Prescription Changes:  Exercise Prescription Changes    Row Name 07/23/20 1615 08/06/20 1600           Response to Exercise   Blood Pressure (Admit) 120/80 128/55      Blood Pressure (Exercise) 152/70 128/78      Blood Pressure (Exit) 138/70 128/78      Heart Rate (Admit) 90 bpm 82 bpm      Heart Rate (Exercise) 97 bpm 88  bpm      Heart Rate (Exit) 88 bpm 71 bpm      Oxygen Saturation (Exercise) 91 % 91 %      Rating of Perceived Exertion (Exercise) 13 11      Perceived Dyspnea (Exercise) 3 0      Symptoms Pt experienced SOB and took a total of 5 breaks due to SOB. RPD = 3 None      Comments First day of exericse. Very deconditioned and experiienced SOB and took 5 breaks.  Reviwed METs      Duration Progress to 30 minutes of  aerobic without signs/symptoms of physical distress Progress to 30 minutes of  aerobic without signs/symptoms of physical distress      Intensity THRR unchanged THRR unchanged        Progression   Progression Continue to progress workloads to maintain intensity without signs/symptoms of physical distress. Continue to progress workloads to maintain intensity without signs/symptoms of physical distress.      Average METs 1.6 1.5        Resistance Training   Training Prescription No No      Weight --  Weights not done on Wednesdays. Will use 1 lb M-F --  Uses 1 lb wts on M and F        Interval Training   Interval Training No No        NuStep   Level 1 1      SPM 70 75      Minutes 25 38      METs 1.6 1.5             Exercise Comments:  Exercise Comments    Row Name 07/23/20 1622 08/06/20  1600         Exercise Comments Pt's first day of exercise. Pt is very decondioned and strugled with level 1 on nustep taking 5 rest breaks. SaO2 = 91 %, RPD =3. Stressed SPM to be no more than 70, pt seemed to be able to tolerate the exercise bout better when she maintained at that level. Pt is progressing well in the CRP2 program. She is engaged, has good attendence and displays an enthusiastic attitude about the CRP2 program. She now tolerates 38 minutes on the nustep with no c/o SOB and does not require any breaks. She voiced that she still gets SOB and fatigued when walking with her cane. She reveals, when questioned, that she has much better exercise tolerance when using her rolling walker. She voices being able to walk further with less severe fatigue and SOB. Pt encouraged to utilze her rolling walker more so that she can experince better exercise tolerance. She agrees to try this.             Exercise Goals and Review:  Exercise Goals    Row Name 07/17/20 1155             Exercise Goals   Increase Physical Activity Yes       Intervention Provide advice, education, support and counseling about physical activity/exercise needs.;Develop an individualized exercise prescription for aerobic and resistive training based on initial evaluation findings, risk stratification, comorbidities and participant's personal goals.       Expected Outcomes Short Term: Attend rehab on a regular basis to increase amount of physical activity.;Long Term: Add in home exercise to make exercise part of routine and to increase amount of physical activity.;Long Term: Exercising regularly at least 3-5 days a week.  Increase Strength and Stamina Yes       Intervention Provide advice, education, support and counseling about physical activity/exercise needs.;Develop an individualized exercise prescription for aerobic and resistive training based on initial evaluation findings, risk stratification, comorbidities  and participant's personal goals.       Expected Outcomes Short Term: Increase workloads from initial exercise prescription for resistance, speed, and METs.;Short Term: Perform resistance training exercises routinely during rehab and add in resistance training at home;Long Term: Improve cardiorespiratory fitness, muscular endurance and strength as measured by increased METs and functional capacity (6MWT)       Able to understand and use rate of perceived exertion (RPE) scale Yes       Intervention Provide education and explanation on how to use RPE scale       Expected Outcomes Short Term: Able to use RPE daily in rehab to express subjective intensity level;Long Term:  Able to use RPE to guide intensity level when exercising independently       Knowledge and understanding of Target Heart Rate Range (THRR) Yes       Intervention Provide education and explanation of THRR including how the numbers were predicted and where they are located for reference       Expected Outcomes Short Term: Able to state/look up THRR;Long Term: Able to use THRR to govern intensity when exercising independently;Short Term: Able to use daily as guideline for intensity in rehab       Able to check pulse independently Yes       Intervention Provide education and demonstration on how to check pulse in carotid and radial arteries.;Review the importance of being able to check your own pulse for safety during independent exercise       Expected Outcomes Short Term: Able to explain why pulse checking is important during independent exercise;Long Term: Able to check pulse independently and accurately       Understanding of Exercise Prescription Yes       Intervention Provide education, explanation, and written materials on patient's individual exercise prescription       Expected Outcomes Short Term: Able to explain program exercise prescription;Long Term: Able to explain home exercise prescription to exercise independently               Exercise Goals Re-Evaluation :  Exercise Goals Re-Evaluation    Row Name 07/23/20 1620             Exercise Goal Re-Evaluation   Exercise Goals Review Increase Physical Activity;Increase Strength and Stamina;Able to understand and use rate of perceived exertion (RPE) scale;Knowledge and understanding of Target Heart Rate Range (THRR);Able to check pulse independently;Understanding of Exercise Prescription;Able to understand and use Dyspnea scale       Comments Pt's first day of exerise in the CRP2 program. Pt struggled with level 1 on nustep experencing SOB with an RPD = 3 and SaO2 @ 91%. Pt need to take 5 rest breaks to drink water and regain her breath. Instucted pt to not exceed 70 steps per minute on nustep. Once pt did this she was able to increase the amount of time between breaks. She understands the exercise prescription, THRR, the RPE and RPD scales.       Expected Outcomes Will continue to monitor and progress patinet as tolerated.               Discharge Exercise Prescription (Final Exercise Prescription Changes):  Exercise Prescription Changes - 08/06/20 1600      Response  to Exercise   Blood Pressure (Admit) 128/55    Blood Pressure (Exercise) 128/78    Blood Pressure (Exit) 128/78    Heart Rate (Admit) 82 bpm    Heart Rate (Exercise) 88 bpm    Heart Rate (Exit) 71 bpm    Oxygen Saturation (Exercise) 91 %    Rating of Perceived Exertion (Exercise) 11    Perceived Dyspnea (Exercise) 0    Symptoms None    Comments Reviwed METs    Duration Progress to 30 minutes of  aerobic without signs/symptoms of physical distress    Intensity THRR unchanged      Progression   Progression Continue to progress workloads to maintain intensity without signs/symptoms of physical distress.    Average METs 1.5      Resistance Training   Training Prescription No    Weight --   Uses 1 lb wts on M and F     Interval Training   Interval Training No      NuStep   Level 1     SPM 75    Minutes 38    METs 1.5           Nutrition:  Target Goals: Understanding of nutrition guidelines, daily intake of sodium 1500mg , cholesterol 200mg , calories 30% from fat and 7% or less from saturated fats, daily to have 5 or more servings of fruits and vegetables.  Biometrics:  Pre Biometrics - 07/17/20 0929      Pre Biometrics   Waist Circumference 40 inches    Hip Circumference 46.5 inches    Waist to Hip Ratio 0.86 %    Triceps Skinfold 25 mm    % Body Fat 42.9 %    Grip Strength 17 kg    Flexibility 14.5 in    Single Leg Stand 0 seconds   Not done: Pt uses walker and cane for mobility           Nutrition Therapy Plan and Nutrition Goals:   Nutrition Assessments:   Nutrition Goals Re-Evaluation:   Nutrition Goals Discharge (Final Nutrition Goals Re-Evaluation):   Psychosocial: Target Goals: Acknowledge presence or absence of significant depression and/or stress, maximize coping skills, provide positive support system. Participant is able to verbalize types and ability to use techniques and skills needed for reducing stress and depression.  Initial Review & Psychosocial Screening:  Initial Psych Review & Screening - 07/17/20 1038      Initial Review   Current issues with Current Sleep Concerns;Current Stress Concerns    Source of Stress Concerns Family    Comments Hassan Buckler reports that she gets up at 3am and 5 am every morning and falls back to sleep. Shervon's son has throat cancer and was recently discharged from the hospital after a lengthy admission.      Family Dynamics   Good Support System? Yes      Barriers   Psychosocial barriers to participate in program The patient should benefit from training in stress management and relaxation.      Screening Interventions   Interventions Encouraged to exercise           Quality of Life Scores:  Quality of Life - 07/17/20 1124      Quality of Life   Select Quality of Life      Quality of  Life Scores   Health/Function Pre 24 %    Socioeconomic Pre 30 %    Psych/Spiritual Pre 30 %    Family Pre 30 %  GLOBAL Pre 27.29 %          Scores of 19 and below usually indicate a poorer quality of life in these areas.  A difference of  2-3 points is a clinically meaningful difference.  A difference of 2-3 points in the total score of the Quality of Life Index has been associated with significant improvement in overall quality of life, self-image, physical symptoms, and general health in studies assessing change in quality of life.  PHQ-9: Recent Review Flowsheet Data    Depression screen [facility] 2/9 07/17/2020 10/12/2019 08/25/2018 07/31/2018 12/12/2017   Decreased Interest 0 0 0 0 0   Down, Depressed, Hopeless 0 1 0 0 0   PHQ - 2 Score 0 1 0 0 0     Interpretation of Total Score  Total Score Depression Severity:  1-4 = Minimal depression, 5-9 = Mild depression, 10-14 = Moderate depression, 15-19 = Moderately severe depression, 20-27 = Severe depression   Psychosocial Evaluation and Intervention:   Psychosocial Re-Evaluation:  Psychosocial Re-Evaluation    [place] Name 08/07/20 0845             Psychosocial Re-Evaluation   Current issues with Current Stress Concerns       Comments Murrell's says her son has a lengthy recovery at home whom she is still concerned about       Expected Outcomes Will continue to offer support as needed       Interventions Encouraged to attend Cardiac Rehabilitation for the exercise;Stress management education       Continue Psychosocial Services  Follow up required by staff       Comments Waleska has not voiced any concerns about insomnia recently         Initial Review   Source of Stress Concerns Family              Psychosocial Discharge (Final Psychosocial Re-Evaluation):  Psychosocial Re-Evaluation - 08/07/20 0845      Psychosocial Re-Evaluation   Current issues with Current Stress Concerns    Comments Jonice's says her son has a  lengthy recovery at home whom she is still concerned about    Expected Outcomes Will continue to offer support as needed    Interventions Encouraged to attend Cardiac Rehabilitation for the exercise;Stress management education    Continue Psychosocial Services  Follow up required by staff    Comments Elany has not voiced any concerns about insomnia recently      Initial Review   Source of Stress Concerns Family           Vocational Rehabilitation: Provide vocational rehab assistance to qualifying candidates.   Vocational Rehab Evaluation & Intervention:  Vocational Rehab - 07/17/20 1225      Initial Vocational Rehab Evaluation & Intervention   Assessment shows need for Vocational Rehabilitation No   Adlai is retired and does not need vocational rehab at this time          Education: Education Goals: Education classes will be provided on a weekly basis, covering required topics. Participant will state understanding/return demonstration of topics presented.  Learning Barriers/Preferences:  Learning Barriers/Preferences - 07/17/20 1125      Learning Barriers/Preferences   Learning Barriers Sight   waers glasses   Learning Preferences Pictoral;Video;Computer/Internet           Education Topics: Hypertension, Hypertension Reduction -Define heart disease and high blood pressure. Discus how high blood pressure affects the body and ways to reduce high blood pressure.  Exercise and Your Heart -Discuss why it is important to exercise, the FITT principles of exercise, normal and abnormal responses to exercise, and how to exercise safely.   Angina -Discuss definition of angina, causes of angina, treatment of angina, and how to decrease risk of having angina.   Cardiac Medications -Review what the following cardiac medications are used for, how they affect the body, and side effects that may occur when taking the medications.  Medications include Aspirin, Beta blockers,  calcium channel blockers, ACE Inhibitors, angiotensin receptor blockers, diuretics, digoxin, and antihyperlipidemics.   Congestive Heart Failure -Discuss the definition of CHF, how to live with CHF, the signs and symptoms of CHF, and how keep track of weight and sodium intake.   Heart Disease and Intimacy -Discus the effect sexual activity has on the heart, how changes occur during intimacy as we age, and safety during sexual activity.   Smoking Cessation / COPD -Discuss different methods to quit smoking, the health benefits of quitting smoking, and the definition of COPD.   Nutrition I: Fats -Discuss the types of cholesterol, what cholesterol does to the heart, and how cholesterol levels can be controlled.   Nutrition II: Labels -Discuss the different components of food labels and how to read food label   Heart Parts/Heart Disease and PAD -Discuss the anatomy of the heart, the pathway of blood circulation through the heart, and these are affected by heart disease.   Stress I: Signs and Symptoms -Discuss the causes of stress, how stress may lead to anxiety and depression, and ways to limit stress.   Stress II: Relaxation -Discuss different types of relaxation techniques to limit stress.   Warning Signs of Stroke / TIA -Discuss definition of a stroke, what the signs and symptoms are of a stroke, and how to identify when someone is having stroke.   Knowledge Questionnaire Score:  Knowledge Questionnaire Score - 07/17/20 1125      Knowledge Questionnaire Score   Pre Score 17/24           Core Components/Risk Factors/Patient Goals at Admission:  Personal Goals and Risk Factors at Admission - 07/17/20 1226      Core Components/Risk Factors/Patient Goals on Admission    Weight Management Yes;Obesity;Weight Loss    Intervention Weight Management: Develop a combined nutrition and exercise program designed to reach desired caloric intake, while maintaining appropriate  intake of nutrient and fiber, sodium and fats, and appropriate energy expenditure required for the weight goal.;Weight Management: Provide education and appropriate resources to help participant work on and attain dietary goals.;Weight Management/Obesity: Establish reasonable short term and long term weight goals.;Obesity: Provide education and appropriate resources to help participant work on and attain dietary goals.    Admit Weight 168 lb 14 oz (76.6 kg)    Goal Weight: Long Term 153 lb (69.4 kg)    Expected Outcomes Short Term: Continue to assess and modify interventions until short term weight is achieved;Long Term: Adherence to nutrition and physical activity/exercise program aimed toward attainment of established weight goal;Weight Maintenance: Understanding of the daily nutrition guidelines, which includes 25-35% calories from fat, 7% or less cal from saturated fats, less than 200mg  cholesterol, less than 1.5gm of sodium, & 5 or more servings of fruits and vegetables daily;Weight Loss: Understanding of general recommendations for a balanced deficit meal plan, which promotes 1-2 lb weight loss per week and includes a negative energy balance of [id number] kcal/d;Understanding recommendations for meals to include 15-35% energy as protein, 25-35% energy from fat,  35-60% energy from carbohydrates, less than 200mg  of dietary cholesterol, 20-35 gm of total fiber daily;Understanding of distribution of calorie intake throughout the day with the consumption of 4-5 meals/snacks    Diabetes Yes    Intervention Provide education about signs/symptoms and action to take for hypo/hyperglycemia.;Provide education about proper nutrition, including hydration, and aerobic/resistive exercise prescription along with prescribed medications to achieve blood glucose in normal ranges: Fasting glucose 65-99 mg/dL    Expected Outcomes Short Term: Participant verbalizes understanding of the signs/symptoms and immediate care of  hyper/hypoglycemia, proper foot care and importance of medication, aerobic/resistive exercise and nutrition plan for blood glucose control.;Long Term: Attainment of HbA1C < 7%.    Heart Failure Yes    Intervention Provide a combined exercise and nutrition program that is supplemented with education, support and counseling about heart failure. Directed toward relieving symptoms such as shortness of breath, decreased exercise tolerance, and extremity edema.    Expected Outcomes Short term: Attendance in program 2-3 days a week with increased exercise capacity. Reported lower sodium intake. Reported increased fruit and vegetable intake. Reports medication compliance.;Short term: Daily weights obtained and reported for increase. Utilizing diuretic protocols set by physician.;Long term: Adoption of self-care skills and reduction of barriers for early signs and symptoms recognition and intervention leading to self-care maintenance.    Hypertension Yes    Intervention Provide education on lifestyle modifcations including regular physical activity/exercise, weight management, moderate sodium restriction and increased consumption of fresh fruit, vegetables, and low fat dairy, alcohol moderation, and smoking cessation.;Monitor prescription use compliance.    Expected Outcomes Short Term: Continued assessment and intervention until BP is < 140/88mm HG in hypertensive participants. < 130/67mm HG in hypertensive participants with diabetes, heart failure or chronic kidney disease.;Long Term: Maintenance of blood pressure at goal levels.    Lipids Yes    Intervention Provide education and support for participant on nutrition & aerobic/resistive exercise along with prescribed medications to achieve LDL 70mg , HDL >40mg .    Expected Outcomes Short Term: Participant states understanding of desired cholesterol values and is compliant with medications prescribed. Participant is following exercise prescription and nutrition  guidelines.;Long Term: Cholesterol controlled with medications as prescribed, with individualized exercise RX and with personalized nutrition plan. Value goals: LDL < 70mg , HDL > 40 mg.    Stress Yes    Intervention Offer individual and/or small group education and counseling on adjustment to heart disease, stress management and health-related lifestyle change. Teach and support self-help strategies.    Expected Outcomes Short Term: Participant demonstrates changes in health-related behavior, relaxation and other stress management skills, ability to obtain effective social support, and compliance with psychotropic medications if prescribed.;Long Term: Emotional wellbeing is indicated by absence of clinically significant psychosocial distress or social isolation.           Core Components/Risk Factors/Patient Goals Review:   Goals and Risk Factor Review    Row Name 08/07/20 0847             Core Components/Risk Factors/Patient Goals Review   Personal Goals Review Weight Management/Obesity;Lipids;Diabetes;Stress;Heart Failure;Hypertension       Review Jilliann's vital signs have been stable. CBG's have varied. Oxygen saturation's have been between 90-94%. Hodaya is enjoying participating in phase 2 and says she feels strnger       Expected Outcomes Sarahgrace will continue to participate in phase 2 cardiac rehab for exercise, nutrition and lifestyle modifications              Core Components/Risk Factors/Patient  Goals at Discharge (Final Review):   Goals and Risk Factor Review - 08/07/20 0847      Core Components/Risk Factors/Patient Goals Review   Personal Goals Review Weight Management/Obesity;Lipids;Diabetes;Stress;Heart Failure;Hypertension    Review Jazmene's vital signs have been stable. CBG's have varied. Oxygen saturation's have been between 90-94%. Haruna is enjoying participating in phase 2 and says she feels strnger    Expected Outcomes Zylpha will continue to participate in  phase 2 cardiac rehab for exercise, nutrition and lifestyle modifications           ITP Comments:  ITP Comments    Row Name 07/17/20 0937 08/07/20 0843         ITP Comments Dr Fransico Him MD, Medical Director 30 Day ITP Review. Patient with good attendance and participation in phase 2 cardiac rehab.             Comments: See ITP comments.Barnet Pall, RN,BSN 08/07/2020 8:53 AM

## 2020-08-08 ENCOUNTER — Encounter (HOSPITAL_COMMUNITY)
Admission: RE | Admit: 2020-08-08 | Discharge: 2020-08-08 | Disposition: A | Discharge status: Home / Self Care | Payer: Medicare Other | Source: Ambulatory Visit | Attending: Internal Medicine | Admitting: Internal Medicine

## 2020-08-08 ENCOUNTER — Other Ambulatory Visit: Payer: Self-pay

## 2020-08-08 DIAGNOSIS — Z955 Presence of coronary angioplasty implant and graft: Secondary | ICD-10-CM

## 2020-08-08 DIAGNOSIS — I214 Non-ST elevation (NSTEMI) myocardial infarction: Secondary | ICD-10-CM

## 2020-08-11 ENCOUNTER — Encounter (HOSPITAL_COMMUNITY): Payer: Medicare Other

## 2020-08-11 ENCOUNTER — Telehealth (HOSPITAL_COMMUNITY): Payer: Self-pay | Admitting: Internal Medicine

## 2020-08-12 ENCOUNTER — Other Ambulatory Visit: Payer: Self-pay

## 2020-08-12 ENCOUNTER — Other Ambulatory Visit (HOSPITAL_COMMUNITY): Payer: Self-pay

## 2020-08-12 MED ORDER — AMLODIPINE BESYLATE 5 MG PO TABS: 5.0000 mg | ORAL_TABLET | Freq: Every day | ORAL | 6 refills | Status: DC

## 2020-08-12 MED ORDER — EZETIMIBE 10 MG PO TABS: 10.0000 mg | ORAL_TABLET | Freq: Every day | ORAL | 10 refills | Status: DC

## 2020-08-13 ENCOUNTER — Other Ambulatory Visit: Payer: Self-pay

## 2020-08-13 ENCOUNTER — Ambulatory Visit (HOSPITAL_COMMUNITY)
Admission: RE | Admit: 2020-08-13 | Discharge: 2020-08-13 | Disposition: A | Discharge status: Home / Self Care | Payer: Medicare Other | Source: Ambulatory Visit | Attending: Internal Medicine | Admitting: Internal Medicine

## 2020-08-13 ENCOUNTER — Encounter (HOSPITAL_COMMUNITY)
Admission: RE | Admit: 2020-08-13 | Discharge: 2020-08-13 | Disposition: A | Discharge status: Home / Self Care | Payer: Medicare Other | Source: Ambulatory Visit | Attending: Internal Medicine | Admitting: Internal Medicine

## 2020-08-13 ENCOUNTER — Telehealth (HOSPITAL_COMMUNITY): Payer: Self-pay | Admitting: Pharmacy Technician

## 2020-08-13 ENCOUNTER — Encounter (HOSPITAL_COMMUNITY): Payer: Self-pay | Admitting: Internal Medicine

## 2020-08-13 VITALS — BP 158/76 | HR 94 | Wt 168.0 lb

## 2020-08-13 DIAGNOSIS — I2721 Secondary pulmonary arterial hypertension: Secondary | ICD-10-CM | POA: Diagnosis not present

## 2020-08-13 DIAGNOSIS — Z79899 Other long term (current) drug therapy: Secondary | ICD-10-CM | POA: Diagnosis not present

## 2020-08-13 DIAGNOSIS — Z7902 Long term (current) use of antithrombotics/antiplatelets: Secondary | ICD-10-CM | POA: Insufficient documentation

## 2020-08-13 DIAGNOSIS — I2510 Atherosclerotic heart disease of native coronary artery without angina pectoris: Secondary | ICD-10-CM | POA: Insufficient documentation

## 2020-08-13 DIAGNOSIS — I272 Pulmonary hypertension, unspecified: Secondary | ICD-10-CM | POA: Insufficient documentation

## 2020-08-13 DIAGNOSIS — I214 Non-ST elevation (NSTEMI) myocardial infarction: Secondary | ICD-10-CM

## 2020-08-13 DIAGNOSIS — E1151 Type 2 diabetes mellitus with diabetic peripheral angiopathy without gangrene: Secondary | ICD-10-CM | POA: Insufficient documentation

## 2020-08-13 DIAGNOSIS — I4892 Unspecified atrial flutter: Secondary | ICD-10-CM | POA: Insufficient documentation

## 2020-08-13 DIAGNOSIS — I252 Old myocardial infarction: Secondary | ICD-10-CM | POA: Insufficient documentation

## 2020-08-13 DIAGNOSIS — I480 Paroxysmal atrial fibrillation: Secondary | ICD-10-CM | POA: Diagnosis not present

## 2020-08-13 DIAGNOSIS — Z8249 Family history of ischemic heart disease and other diseases of the circulatory system: Secondary | ICD-10-CM | POA: Diagnosis not present

## 2020-08-13 DIAGNOSIS — Z955 Presence of coronary angioplasty implant and graft: Secondary | ICD-10-CM | POA: Insufficient documentation

## 2020-08-13 DIAGNOSIS — G4733 Obstructive sleep apnea (adult) (pediatric): Secondary | ICD-10-CM | POA: Insufficient documentation

## 2020-08-13 DIAGNOSIS — E785 Hyperlipidemia, unspecified: Secondary | ICD-10-CM | POA: Insufficient documentation

## 2020-08-13 DIAGNOSIS — Z87891 Personal history of nicotine dependence: Secondary | ICD-10-CM | POA: Insufficient documentation

## 2020-08-13 DIAGNOSIS — I10 Essential (primary) hypertension: Secondary | ICD-10-CM

## 2020-08-13 DIAGNOSIS — I110 Hypertensive heart disease with heart failure: Secondary | ICD-10-CM | POA: Insufficient documentation

## 2020-08-13 DIAGNOSIS — Z794 Long term (current) use of insulin: Secondary | ICD-10-CM | POA: Diagnosis not present

## 2020-08-13 DIAGNOSIS — J441 Chronic obstructive pulmonary disease with (acute) exacerbation: Secondary | ICD-10-CM | POA: Insufficient documentation

## 2020-08-13 DIAGNOSIS — Z7901 Long term (current) use of anticoagulants: Secondary | ICD-10-CM | POA: Diagnosis not present

## 2020-08-13 DIAGNOSIS — I5042 Chronic combined systolic (congestive) and diastolic (congestive) heart failure: Secondary | ICD-10-CM | POA: Diagnosis not present

## 2020-08-13 NOTE — Progress Notes (Signed)
Advanced Heart Failure Clinic Note   Date:  08/13/2020   ID:  [name], DOB 1931-12-24, MRN [id number]  Location: Home  Provider location: [facility] Type of Visit: Established patient  PCP:  [name], MD  Cardiologist:  Pixie Casino, MD Primary HF: [name]  Chief Complaint: Heart Failure follow-up   History of Present Illness:  [name] is a 86 y.o. female with CAD s/p 1v stent, HTN, atrial fibrillation/A flutter and chronic prior systolic heart failure, (which was likely rate related) and diastolic heart failure. She was referred by Dr. Lovena Le for further evaluation of Pulmonary HTN.   Underwent 2D echo in 3/15 which showed EF 60-65% mild LVH. RV was normal. No significant valvular abnormalities.   Underwent R/L cath 05/07/14 Which showed stable CAD and significant PH with normal PVR.   In 4/21 saw Dr. Scot Dock and had LE angio with severe PAD underwent stenting of 70% R external iliac  Admitted 05/21/17-05/27/17 with COPD exacerbation, acute on chronic diastolic CHF. Diuresed 5L with IV Lasix. Discharge weight 173 pounds.  Myoview 9/20   Nuclear stress EF: 56%.  There was no ST segment deviation noted during stress.  Defect 1: There is a medium defect of moderate severity present in the mid inferolateral, apical inferior and apical lateral location.  Findings consistent with possible mild ischemia in the apical inferior and inferolateral regions.  The left ventricular ejection fraction is normal (55-65%).    Admitted in 5/21 with NSTEMI hstrop peak 1,455. Echo 5/21: EF 50-55%  Cath 05/16/20 with  1. Severe 3 vessel obstructive CAD 2. Severe pulmonary HTN 3. Successful PCI of the proximal LCx/OM1 with DES x 1 with IVUS guidance 4. Successful PCI of the proximal LAD with orbital atherectomy and DES x 1  Ao = 172/68 (108) LV = 166/10 RA = 6 RV = 75/6 PA = 77/26 (45) PCW = 10 Fick cardiac output/index =  4.0/2.2 PVR = 8.8 WU FA sat = 99% PA sat = 67%, 73%  She presents today for routine f/u. Is going to CR and feeling much better. BP well controlled at CR. Doing Nu-step for 40 mins. Denies CP. Mild DOE. Edema well controlled.    Past Medical History:  Diagnosis Date  . Anemia    iron defficiency  . Anginal pain ([place])   . Arthritis    "knees, feet, hands; joints" (05/27/2015)  . Asthma   . Atrial fibrillation or flutter    s/p RFCA 7/08;   s/p DCCV in past;   previously on amiodarone;  amio stopped due to lung toxicity  . CAD (coronary artery disease)    s/p NSTEMI tx with BMS to OM1 3/08;  cath 3/08: pOM 99% tx with PCI, pLAD 20%, ? mod stenosis at the AM  . CAP (community acquired pneumonia) 05/24/2015  . Chronic diastolic heart failure (HCC)    echo 11/11:  EF 55-60%, severe LVH, mod LAE, mild MR, mildly increased PASP  . COPD (chronic obstructive pulmonary disease) ([facility])   . Degenerative joint disease   . Dystrophy, corneal stromal   . Heart murmur   . HLD (hyperlipidemia)   . HTN (hypertension)    essential nos  . Hypopotassemia    PMH of  . Muscle pain   . Myocardial infarction ([place]) 2008  . OSA on CPAP   . Osteoporosis   . Pneumonia 05/27/2015  . Protein calorie malnutrition ([place])   .  Rash and nonspecific skin eruption    both arms,awaiting bio   dr Delman Cheadle  . Seasonal allergies   . Shortness of breath dyspnea   . Type II diabetes mellitus ([place])    Dr Loanne Drilling   Past Surgical History:  Procedure Laterality Date  . A FLUTTER ABLATION     Dr Lovena Le  . ABDOMINAL AORTOGRAM W/LOWER EXTREMITY Bilateral 04/11/2020   Procedure: ABDOMINAL AORTOGRAM W/LOWER EXTREMITY;  Surgeon: Angelia Mould, MD;  Location: [facility];  Service: Cardiovascular;  Laterality: Bilateral;  . CATARACT EXTRACTION W/ INTRAOCULAR LENS  IMPLANT, BILATERAL Bilateral   . CHOLECYSTECTOMY    . COLONOSCOPY  6/07   2 polyps Dr. Kinnie Feil in [place]  . colonoscopy with polypectomy      X 2;  [facility]  . CORNEAL TRANSPLANT Bilateral   . CORONARY ANGIOPLASTY WITH STENT PLACEMENT  02/2007   [facility] w/Dr Lovena Le  . CORONARY ATHERECTOMY N/A 05/16/2020   Procedure: CORONARY ATHERECTOMY;  Surgeon: Martinique, Peter M, MD;  Location: [place];  Service: Cardiovascular;  Laterality: N/A;  . CORONARY BALLOON ANGIOPLASTY N/A 05/16/2020   Procedure: CORONARY BALLOON ANGIOPLASTY;  Surgeon: Martinique, Peter M, MD;  Location: [facility];  Service: Cardiovascular;  Laterality: N/A;  . CORONARY STENT INTERVENTION N/A 05/16/2020   Procedure: CORONARY STENT INTERVENTION;  Surgeon: Martinique, Peter M, MD;  Location: [facility] [facility];  Service: Cardiovascular;  Laterality: N/A;  . EYE SURGERY    . gravid 2 para 2    . INTRAVASCULAR ULTRASOUND/IVUS N/A 05/16/2020   Procedure: Intravascular Ultrasound/IVUS;  Surgeon: Martinique, Peter M, MD;  Location: [facility];  Service: Cardiovascular;  Laterality: N/A;  . KNEE CARTILAGE SURGERY Right   . LEFT AND RIGHT HEART CATHETERIZATION WITH CORONARY ANGIOGRAM N/A 05/07/2014   Procedure: LEFT AND RIGHT HEART CATHETERIZATION WITH CORONARY ANGIOGRAM;  Surgeon: Peter M Martinique, MD;  Location: [facility];  Service: Cardiovascular;  Laterality: N/A;  . PERIPHERAL VASCULAR INTERVENTION  04/11/2020   Procedure: PERIPHERAL VASCULAR INTERVENTION;  Surgeon: Angelia Mould, MD;  Location: [facility];  Service: Cardiovascular;;  . RIGHT/LEFT HEART CATH AND CORONARY ANGIOGRAPHY N/A 05/16/2020   Procedure: RIGHT/LEFT HEART CATH AND CORONARY ANGIOGRAPHY;  Surgeon: Jolaine Artist, MD;  Location: [facility];  Service: Cardiovascular;  Laterality: N/A;  . TOTAL KNEE ARTHROPLASTY Right 06/28/2016   Procedure: TOTAL KNEE ARTHROPLASTY;  Surgeon: Frederik Pear, MD;  Location: [facility];  Service: Orthopedics;  Laterality: Right;     Current Outpatient Medications  Medication Sig Dispense Refill  . acetaminophen (TYLENOL) 500 MG tablet Take 1,000 mg by  mouth every 6 (six) hours as needed (pain).    Marland Kitchen amLODipine (NORVASC) 5 MG tablet Take 1 tablet (5 mg total) by mouth daily. 30 tablet 6  . apixaban (ELIQUIS) 5 MG TABS tablet Take 5 mg by mouth 2 (two) times daily.    Marland Kitchen CALCIUM-MAG-VIT C-VIT D PO Take 1 tablet by mouth daily.    . carvedilol (COREG) 6.25 MG tablet Take 6.25 mg by mouth 2 (two) times daily with a meal.    . cholecalciferol (VITAMIN D) 1000 units tablet Take 1,000 Units by mouth daily after supper.    . clopidogrel (PLAVIX) 75 MG tablet Take 1 tablet (75 mg total) by mouth daily with breakfast. 90 tablet 2  . empagliflozin (JARDIANCE) 10 MG TABS tablet Take 10 mg by mouth daily before breakfast. 90 tablet 3  . Evolocumab (REPATHA  SURECLICK) 786 MG/ML SOAJ Inject 1 Dose into the skin every 14 (fourteen) days. 2 pen 11  . ezetimibe (ZETIA) 10 MG tablet Take 1 tablet (10 mg total) by mouth daily. 30 tablet 10  . furosemide (LASIX) 80 MG tablet TAKE 1 TABLET BY MOUTH TWO  TIMES DAILY 180 tablet 3  . glucose blood (ONETOUCH VERIO) test strip Use to monitor glucose levels 2 times daily 200 each 5  . insulin NPH-regular Human (NOVOLIN 70/30 RELION) (70-30) 100 UNIT/ML injection Inject 31 Units into the skin daily with breakfast. And syringes 1/day 10 mL 11  . Lancets (ONETOUCH DELICA PLUS [id number]) MISC USE TO MONITOR GLUCOSE  LEVELS TWICE DAILY 200 each 3  . Multiple Vitamin (MULTIVITAMIN WITH MINERALS) TABS tablet Take 1 tablet by mouth daily. Centrum Silver    . nitroGLYCERIN (NITROSTAT) 0.4 MG SL tablet Place 1 tablet (0.4 mg total) under the tongue every 5 (five) minutes as needed for chest pain. Reported on 01/28/2016 30 tablet 3  . potassium chloride SA (KLOR-CON) 20 MEQ tablet Take 20 mEq by mouth 2 (two) times daily.     No current facility-administered medications for this encounter.    Allergies:   Levaquin [levofloxacin], Lobster [shellfish allergy], Penicillins, Valsartan, Codeine, Lipitor [atorvastatin], Oxycodone, Statins,  Tramadol, Amlodipine besylate, Crestor [rosuvastatin calcium], and Tramadol hcl   Social History:  The patient  reports that she quit smoking about 51 years ago. Her smoking use included cigarettes. She has a 4.50 pack-year smoking history. She has never used smokeless tobacco. She reports current alcohol use. She reports that she does not use drugs.   Family History:  The patient's family history includes Arthritis in her father, maternal aunt, and mother; Breast cancer in her maternal aunt; Diabetes in her mother; Heart attack (age of onset: 46) in her father; Hypertension in her father and mother; Transient ischemic attack in her mother.   ROS:  Please see the history of present illness.   All other systems are personally reviewed and negative.   Vitals:   08/13/20 1050  BP: (!) 158/76  Pulse: 94  SpO2: 94%  Weight: 76.2 kg (168 lb)    Exam:   General:  Well appearing. No resp difficulty HEENT: normal Neck: supple. JVP 8. Carotids 2+ bilat; no bruits. No lymphadenopathy or thryomegaly appreciated. Cor: PMI nondisplaced. Regular rate & rhythm. No rubs, gallops or murmurs. Lungs: clear Abdomen: soft, nontender, nondistended. No hepatosplenomegaly. No bruits or masses. Good bowel sounds. Extremities: no cyanosis, clubbing, rash, edema Neuro: alert & orientedx3, cranial nerves grossly intact. moves all 4 extremities w/o difficulty. Affect pleasant    Recent Labs: 02/20/2020: ALT 21 05/15/2020: Magnesium 1.9; TSH 0.866 06/09/2020: B Natriuretic Peptide 225.6; BUN 23; Creatinine, Ser 1.23; Hemoglobin 15.9; Platelets 197; Potassium 4.2; Sodium 140  Personally reviewed   Wt Readings from Last 3 Encounters:  08/13/20 76.2 kg (168 lb)  07/29/20 76.2 kg (168 lb)  07/17/20 76.6 kg (168 lb 14 oz)    AFL 82 lateral ST depression Personally reviewed   ASSESSMENT AND PLAN:   1) CAD - H/o of stent in 2008 - NSTEMI 5/21. Cath as above. S/p PCI to LAD & OM - No s/s angina - On Plavix,  apixaban - Unable to tolerate statins. On Repatha/zetia per Lipid Clinic - Doing well with CR. Will continue  2) PAD - severe  - s/p stenting to R external iliac with Dr. Scot Dock - Mild claudication on RLE. No wounds. Continue medical rx  3)  PAH - Moderate to severe by cath 5/21. PVR 8.8 - suspect multifactorial  - now using CPAP - doing very well. Once CR complete and she is on CPAP for several months will repeat RHC   4) Chronic diastolic CHF:  - Echo 02/2548 EF 60-65%, LA severely dilated, Echo 10/2017: EF 65-70%, grade 3 DD - Echo 5/21 EF 50-55% - Stable NYHA II-III - Volume status ok. Weight stable.  - Continue lasix 80 mg BID. Can take extra as needed - Continue KCL 40 bid - Not on Entresto due to h/o angioedema with ARB  5) Paroxysmal atrial fib/flutter - Back in AFL today. Rate controlled. Asx This patients CHA2DS2-VASc Score and unadjusted Ischemic Stroke Rate (% per year) is equal to 7.2 % stroke rate/year from a score of 5 Above score calculated as 2 points each if present [Age > 75, or Stroke/TIA/TE] - Continue Eliquis. No bleeding.  6) OSA/COPD - Follows with Dr. Halford Chessman.  - Compliant with CPAP  7) HTN - Blood pressure well controlled. Continue current regimen.  8) DM2 - Continue Jardiance 5mg  daily (unable to tolerate 10mg  daily) - No change  9) HL  - Unable to tolerate statins. On Repatha/Zetia - Follows with Lipid Clinic   Signed, Glori Bickers, MD  08/13/2020 11:09 AM  [facility] [place] [address] [address] [phone] (office) [phone] (fax)

## 2020-08-13 NOTE — Telephone Encounter (Signed)
Called [employer] to check the status of the patient's application, OOP report was sent 08/04. The representative sent it to the processing team for redetermination.  Will follow up.

## 2020-08-13 NOTE — Patient Instructions (Signed)
Continue current medications  Your physician recommends that you schedule a follow-up appointment in: 4 months  If you have any questions or concerns before your next appointment please send [place] a message through [place] or call our office at [phone].    TO LEAVE A MESSAGE FOR THE NURSE SELECT OPTION 2, PLEASE LEAVE A MESSAGE INCLUDING: . YOUR NAME . DATE OF BIRTH . CALL BACK NUMBER . REASON FOR CALL**this is important as we prioritize the call backs  [place] AS LONG AS YOU CALL BEFORE 4:00 PM  At the [facility], you and your health needs are our priority. As part of our continuing mission to provide you with exceptional heart care, we have created designated Provider Care Teams. These Care Teams include your primary Cardiologist (physician) and Advanced Practice Providers (APPs- Physician Assistants and Nurse Practitioners) who all work together to provide you with the care you need, when you need it.   You may see any of the following providers on your designated Care Team at your next follow up: Marland Kitchen Dr Glori Bickers . Dr Loralie Champagne . Darrick Grinder, NP . Lyda Jester, PA . Audry Riles, PharmD   Please be sure to bring in all your medications bottles to every appointment.

## 2020-08-14 DIAGNOSIS — G4733 Obstructive sleep apnea (adult) (pediatric): Secondary | ICD-10-CM | POA: Diagnosis not present

## 2020-08-14 NOTE — Progress Notes (Signed)
Reviewed home exercise Rx with patient. Pt voices she is currently not doing purposeful walking at her ALF but is eager to begin. Importance of additional walking discussed. Pt will walk 2 x/ week in addition to her CRP2 program. She will utilize her rolling walker as opposed to her cane as it affords her better security and endurance. She will do 3 - 10 minutes bouts of walking during the day. Pt will be walking indoors but we did discuss weather parameters for temperature an humidity in case she wants to move her walks outdoors eventually. Reviewed her THRR of 54-107 and that her walks should be between 11-13 on the RPE scale. Reviewed safe and proper use of NTG. Reviewed S/S to terminate exercise and when to call MD vs 911. Pt verbalized understanding of the home exercise Rx and she was provided copy.  Lesly Rubenstein MS, ACSM-EP-C, CCRP

## 2020-08-15 ENCOUNTER — Other Ambulatory Visit: Payer: Self-pay

## 2020-08-15 ENCOUNTER — Encounter (HOSPITAL_COMMUNITY)
Admission: RE | Admit: 2020-08-15 | Discharge: 2020-08-15 | Disposition: A | Discharge status: Home / Self Care | Payer: Medicare Other | Source: Ambulatory Visit | Attending: Internal Medicine | Admitting: Internal Medicine

## 2020-08-15 DIAGNOSIS — I214 Non-ST elevation (NSTEMI) myocardial infarction: Secondary | ICD-10-CM | POA: Diagnosis not present

## 2020-08-15 DIAGNOSIS — Z955 Presence of coronary angioplasty implant and graft: Secondary | ICD-10-CM | POA: Diagnosis not present

## 2020-08-18 ENCOUNTER — Encounter (HOSPITAL_COMMUNITY)
Admission: RE | Admit: 2020-08-18 | Discharge: 2020-08-18 | Disposition: A | Discharge status: Home / Self Care | Payer: Medicare Other | Source: Ambulatory Visit | Attending: Internal Medicine | Admitting: Internal Medicine

## 2020-08-18 ENCOUNTER — Other Ambulatory Visit: Payer: Self-pay

## 2020-08-18 ENCOUNTER — Ambulatory Visit: Payer: Medicare Other | Admitting: Pharmacist

## 2020-08-18 DIAGNOSIS — I214 Non-ST elevation (NSTEMI) myocardial infarction: Secondary | ICD-10-CM | POA: Diagnosis not present

## 2020-08-18 DIAGNOSIS — M8580 Other specified disorders of bone density and structure, unspecified site: Secondary | ICD-10-CM

## 2020-08-18 DIAGNOSIS — E782 Mixed hyperlipidemia: Secondary | ICD-10-CM

## 2020-08-18 DIAGNOSIS — Z955 Presence of coronary angioplasty implant and graft: Secondary | ICD-10-CM

## 2020-08-18 DIAGNOSIS — I4811 Longstanding persistent atrial fibrillation: Secondary | ICD-10-CM

## 2020-08-18 DIAGNOSIS — I5032 Chronic diastolic (congestive) heart failure: Secondary | ICD-10-CM

## 2020-08-18 DIAGNOSIS — I25119 Atherosclerotic heart disease of native coronary artery with unspecified angina pectoris: Secondary | ICD-10-CM

## 2020-08-18 DIAGNOSIS — I10 Essential (primary) hypertension: Secondary | ICD-10-CM

## 2020-08-18 DIAGNOSIS — E1122 Type 2 diabetes mellitus with diabetic chronic kidney disease: Secondary | ICD-10-CM

## 2020-08-18 NOTE — Progress Notes (Signed)
Subjective:    Patient ID: [name], female    DOB: [birth year], 86 y.o.   MRN: [id number]  HPI The patient is here for follow up of their chronic medical problems, including DM, HFpEF, htn, hyperlipidemia, COPD, OA, CKD, leg edema  She is taking all of her medications as prescribed.  She has had two heart stents placed and a stent in her leg.    She has moved to a retirement community.     She is exercising regularly.  She is doing cardiac rehab and it is going well.   She is working with a Engineer, maintenance (IT) at rehab.    She wants to lose 10-15 lbs.    When she wakes up her right leg is  Numb until she gets up and moves.  She has pain in both feet.  She sees her vascular surgeon next month.    Peripheral neuropathy: 1 thing that does bother her is her neuropathy. She has occasional shooting pain in her lower legs. She also has numbness in her feet when she is inactive. She has tried gabapentin in the past, but does not feel that it helped much.     Medications and allergies reviewed with patient and updated if appropriate.  Patient Active Problem List   Diagnosis Date Noted  . Unstable angina ([place]) 05/15/2020  . PAD (peripheral artery disease) ([place]) 02/29/2020  . Chronic kidney disease (CKD) 02/20/2020  . Foot pain 02/20/2020  . Difficulty sleeping 08/01/2019  . Osteoarthritis 08/01/2019  . Abdominal cramping 01/31/2019  . Arthritis of left sacroiliac joint 05/04/2017  . Osteopenia 02/17/2017  . Piriformis syndrome of left side 02/16/2017  . Acquired leg length discrepancy 02/16/2017  . COPD GOLD I  01/27/2017  . Lumbar radiculopathy, acute 01/27/2017  . Granuloma annulare 07/27/2016  . Numbness of fingers 07/27/2016  . Primary osteoarthritis of right knee 06/27/2016  . Rash and nonspecific skin eruption 06/09/2016  . Diabetes ([place]) 03/07/2016  . OSA  08/28/2014  . Chronic diastolic heart failure ([place]) 07/30/2014  . Nocturnal hypoxemia 07/09/2014  . SOB  (shortness of breath) 05/15/2014  . Pulmonary HTN ([place]) 05/15/2014  . Band keratopathy 01/16/2014  . 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 Hyperthyroidism 11/04/2011  . Multinodular goiter 10/14/2011  . Atrophy, Fuchs' 09/29/2011  . ATRIAL FIBRILLATION 06/03/2010  . VITILIGO 11/07/2009  . SINOATRIAL NODE DYSFUNCTION 06/16/2009  . Atrial flutter ([place]) 06/25/2008  . HYPERLIPIDEMIA 01/29/2008  . Coronary atherosclerosis 01/29/2008  . HTN (hypertension) 10/30/2007    Current Outpatient Medications on File Prior to Visit  Medication Sig Dispense Refill  . acetaminophen (TYLENOL) 500 MG tablet Take 1,000 mg by mouth every 6 (six) hours as needed (pain).    Marland Kitchen amLODipine (NORVASC) 5 MG tablet Take 1 tablet (5 mg total) by mouth daily. 30 tablet 6  . apixaban (ELIQUIS) 5 MG TABS tablet Take 5 mg by mouth 2 (two) times daily.    Marland Kitchen CALCIUM-MAG-VIT C-VIT D PO Take 1 tablet by mouth daily.     . carvedilol (COREG) 6.25 MG tablet Take 6.25 mg by mouth 2 (two) times daily with a meal.    . cholecalciferol (VITAMIN D) 1000 units tablet Take 1,000 Units by mouth daily after supper.    . clopidogrel (PLAVIX) 75 MG tablet Take 1 tablet (75 mg total) by mouth daily with breakfast. 90 tablet 2  . empagliflozin (JARDIANCE) 10 MG TABS tablet Take 10 mg by mouth daily before breakfast. 90 tablet 3  .  Evolocumab (REPATHA SURECLICK) 301 MG/ML SOAJ Inject 1 Dose into the skin every 14 (fourteen) days. 2 pen 11  . ezetimibe (ZETIA) 10 MG tablet Take 1 tablet (10 mg total) by mouth daily. 30 tablet 10  . furosemide (LASIX) 80 MG tablet TAKE 1 TABLET BY MOUTH TWO  TIMES DAILY 180 tablet 3  . glucose blood (ONETOUCH VERIO) test strip Use to monitor glucose levels 2 times daily 200 each 5  . insulin NPH-regular Human (NOVOLIN 70/30 RELION) (70-30) 100 UNIT/ML injection Inject 31 Units into the skin daily with breakfast. And syringes 1/day 10 mL 11  . Lancets (ONETOUCH DELICA PLUS [id number]) MISC USE TO MONITOR  GLUCOSE  LEVELS TWICE DAILY 200 each 3  . Multiple Vitamin (MULTIVITAMIN WITH MINERALS) TABS tablet Take 1 tablet by mouth daily. Centrum Silver    . nitroGLYCERIN (NITROSTAT) 0.4 MG SL tablet Place 1 tablet (0.4 mg total) under the tongue every 5 (five) minutes as needed for chest pain. Reported on 01/28/2016 30 tablet 3  . potassium chloride SA (KLOR-CON) 20 MEQ tablet Take 20 mEq by mouth 2 (two) times daily.     No current facility-administered medications on file prior to visit.    Past Medical History:  Diagnosis Date  . Anemia    iron defficiency  . Anginal pain ([place])   . Arthritis    "knees, feet, hands; joints" (05/27/2015)  . Asthma   . Atrial fibrillation or flutter    s/p RFCA 7/08;   s/p DCCV in past;   previously on amiodarone;  amio stopped due to lung toxicity  . CAD (coronary artery disease)    s/p NSTEMI tx with BMS to OM1 3/08;  cath 3/08: pOM 99% tx with PCI, pLAD 20%, ? mod stenosis at the AM  . CAP (community acquired pneumonia) 05/24/2015  . Chronic diastolic heart failure (HCC)    echo 11/11:  EF 55-60%, severe LVH, mod LAE, mild MR, mildly increased PASP  . COPD (chronic obstructive pulmonary disease) ([place])   . Degenerative joint disease   . Dystrophy, corneal stromal   . Heart murmur   . HLD (hyperlipidemia)   . HTN (hypertension)    essential nos  . Hypopotassemia    PMH of  . Muscle pain   . Myocardial infarction ([place]) 2008  . OSA on CPAP   . Osteoporosis   . Pneumonia 05/27/2015  . Protein calorie malnutrition ([place])   . Rash and nonspecific skin eruption    both arms,awaiting bio   dr Delman Cheadle  . Seasonal allergies   . Shortness of breath dyspnea   . Type II diabetes mellitus ([place])    Dr Loanne Drilling    Past Surgical History:  Procedure Laterality Date  . A FLUTTER ABLATION     Dr Lovena Le  . ABDOMINAL AORTOGRAM W/LOWER EXTREMITY Bilateral 04/11/2020   Procedure: ABDOMINAL AORTOGRAM W/LOWER EXTREMITY;  Surgeon: Angelia Mould, MD;  Location: [facility];  Service: Cardiovascular;  Laterality: Bilateral;  . CATARACT EXTRACTION W/ INTRAOCULAR LENS  IMPLANT, BILATERAL Bilateral   . CHOLECYSTECTOMY    . COLONOSCOPY  6/07   2 polyps Dr. Kinnie Feil in [place]  . colonoscopy with polypectomy      X 2; [facility]  . CORNEAL TRANSPLANT Bilateral   . CORONARY ANGIOPLASTY WITH STENT PLACEMENT  02/2007   BMS w/Dr Lovena Le  . CORONARY ATHERECTOMY N/A 05/16/2020   Procedure: CORONARY ATHERECTOMY;  Surgeon: Martinique, Peter M, MD;  Location: [facility];  Service: Cardiovascular;  Laterality: N/A;  . CORONARY BALLOON ANGIOPLASTY N/A 05/16/2020   Procedure: CORONARY BALLOON ANGIOPLASTY;  Surgeon: Martinique, Peter M, MD;  Location: [facility];  Service: Cardiovascular;  Laterality: N/A;  . CORONARY STENT INTERVENTION N/A 05/16/2020   Procedure: CORONARY STENT INTERVENTION;  Surgeon: Martinique, Peter M, MD;  Location: [facility];  Service: Cardiovascular;  Laterality: N/A;  . EYE SURGERY    . gravid 2 para 2    . INTRAVASCULAR ULTRASOUND/IVUS N/A 05/16/2020   Procedure: Intravascular Ultrasound/IVUS;  Surgeon: Martinique, Peter M, MD;  Location: [facility];  Service: Cardiovascular;  Laterality: N/A;  . KNEE CARTILAGE SURGERY Right   . LEFT AND RIGHT HEART CATHETERIZATION WITH CORONARY ANGIOGRAM N/A 05/07/2014   Procedure: LEFT AND RIGHT HEART CATHETERIZATION WITH CORONARY ANGIOGRAM;  Surgeon: Peter M Martinique, MD;  Location: [facility];  Service: Cardiovascular;  Laterality: N/A;  . PERIPHERAL VASCULAR INTERVENTION  04/11/2020   Procedure: PERIPHERAL VASCULAR INTERVENTION;  Surgeon: Angelia Mould, MD;  Location: [facility];  Service: Cardiovascular;;  . RIGHT/LEFT HEART CATH AND CORONARY ANGIOGRAPHY N/A 05/16/2020   Procedure: RIGHT/LEFT HEART CATH AND CORONARY ANGIOGRAPHY;  Surgeon: Jolaine Artist, MD;  Location: [facility];  Service: Cardiovascular;  Laterality: N/A;  . TOTAL KNEE ARTHROPLASTY Right 06/28/2016     Procedure: TOTAL KNEE ARTHROPLASTY;  Surgeon: Frederik Pear, MD;  Location: [facility];  Service: Orthopedics;  Laterality: Right;    Social History   Socioeconomic History  . Marital status: Widowed    Spouse name: Not on file  . Number of children: 2  . Years of education: 16  . Highest education level: Bachelor's degree (e.g., BA, AB, BS)  Occupational History  . Occupation: Recruitment consultant Retired  Tobacco Use  . Smoking status: Former Smoker    Packs/day: 0.25    Years: 18.00    Pack years: 4.50    Types: Cigarettes    Quit date: 12/27/1968    Years since quitting: 51.6  . Smokeless tobacco: Never Used  . Tobacco comment: smoked [place], up to 1 pp week  Vaping Use  . Vaping Use: Never used  Substance and Sexual Activity  . Alcohol use: Yes    Comment: occ  . Drug use: No  . Sexual activity: Never  Other Topics Concern  . Not on file  Social History Narrative   Teacher. Widowed. Rarely drinks cafeine.    Social Determinants of Health   Financial Resource Strain: Low Risk   . Difficulty of Paying Living Expenses: Not very hard  Food Insecurity:   . Worried About Charity fundraiser in the Last Year: Not on file  . Ran Out of Food in the Last Year: Not on file  Transportation Needs:   . Lack of Transportation (Medical): Not on file  . Lack of Transportation (Non-Medical): Not on file  Physical Activity: Insufficiently Active  . Days of Exercise per Week: 4 days  . Minutes of Exercise per Session: 30 min  Stress: No Stress Concern Present  . Feeling of Stress : Only a little  Social Connections: Moderately Integrated  . Frequency of Communication with Friends and Family: More than three times a week  . Frequency of Social Gatherings with Friends and Family: More than three times a week  . Attends Religious Services: 1 to 4 times per year  . Active Member of Clubs or Organizations: Yes  . Attends Archivist Meetings:  1 to 4 times per year  . Marital Status:  Widowed    Family History  Problem Relation Age of Onset  . Diabetes Mother   . Hypertension Mother   . Transient ischemic attack Mother   . Arthritis Mother   . Heart attack Father 5  . Arthritis Father   . Hypertension Father   . Breast cancer Maternal Aunt   . Arthritis Maternal Aunt     Review of Systems  Constitutional: Negative for chills and fever.  Respiratory: Positive for shortness of breath (with exertion - chronic). Negative for cough and wheezing.   Cardiovascular: Positive for leg swelling (with inc sodium intake). Negative for chest pain and palpitations.  Gastrointestinal: Negative for abdominal pain and nausea.       No gerd  Neurological: Negative for dizziness, light-headedness and headaches.       Objective:   Vitals:   08/19/20 1042  BP: 134/82  Pulse: 68  Temp: 97.9 F (36.6 C)  SpO2: 98%   BP Readings from Last 3 Encounters:  08/19/20 134/82  08/13/20 (!) 158/76  07/17/20 (!) 130/62   Wt Readings from Last 3 Encounters:  08/19/20 168 lb (76.2 kg)  08/13/20 168 lb (76.2 kg)  07/29/20 168 lb (76.2 kg)   Body mass index is 29.76 kg/m.   Physical Exam    Constitutional: Appears well-developed and well-nourished. No distress.  HENT:  Head: Normocephalic and atraumatic.  Neck: Neck supple. No tracheal deviation present. No thyromegaly present.  No cervical lymphadenopathy Cardiovascular: Normal rate, regular rhythm and normal heart sounds.   No murmur heard. No carotid bruit . Trace bilateral lower extremity edema Pulmonary/Chest: Effort normal and breath sounds normal. No respiratory distress. No has no wheezes. No rales.  Skin: Skin is warm and dry. Not diaphoretic.  Psychiatric: Normal mood and affect. Behavior is normal.      Assessment & Plan:    20 minutes were spent face-to-face with the patient reviewing recent records, her chronic medical problems, discussing weight loss and her neuropathy. Options reviewed for treatment of  her neuropathy including gabapentin, Lyrica, Cymbalta and vitamins.    See Problem List for Assessment and Plan of chronic medical problems.    This visit occurred during the SARS-CoV-2 public health emergency.  Safety protocols were in place, including screening questions prior to the visit, additional usage of staff PPE, and extensive cleaning of exam room while observing appropriate contact time as indicated for disinfecting solutions.

## 2020-08-18 NOTE — Patient Instructions (Addendum)
Visit Information  Phone number for Pharmacist: [phone]  Thank you for meeting with me to discuss your medications! I look forward to working with you to achieve your health care goals. Below is a summary of what we talked about during the visit:  Goals Addressed            This Visit's Progress   . Pharmacy Care Plan       CARE PLAN ENTRY (see longitudinal plan of care for additional care plan information)  Current Barriers:  . Chronic Disease Management support, education, and care coordination needs related to Hypertension, Hyperlipidemia, Diabetes, Atrial Fibrillation, Heart Failure, Coronary Artery Disease, and Osteopenia   Hypertension / Heart Failure BP Readings from Last 3 Encounters:  08/13/20 (!) 158/76  07/17/20 (!) 130/62  07/15/20 106/74 .  Pharmacist Clinical Goal(s): o Over the next 180 days, patient will work with PharmD and providers to maintain BP goal <140/90 . Current regimen:  o Amlodipine 5 mg daily o Carvedilol 6.25 mg twice a day o Furosemide 80 mg twice a day o Potassium 20 mEq twice a day o Jardiance 10 mg daily . Interventions: o Discussed BP goals and benefits of medication for prevention of heart attack / stroke o Discussed weighing daily and when to increase furosemide if weight gain of 3+ lbs overnight . Patient self care activities - Over the next 180 days, patient will: o Check BP 3 times a week, document, and provide at future appointments o Ensure daily salt intake < 2300 mg/day  Hyperlipidemia / Coronary artery disease Lab Results  Component Value Date/Time   LDLCALC 96 05/16/2020 04:35 AM   LDLDIRECT 144.6 02/03/2010 08:38 AM .  Pharmacist Clinical Goal(s): o Over the next 180 days, patient will work with PharmD and providers to achieve LDL goal < 70 . Current regimen:  o Clopidogrel 65 mg daily o Repatha 140 mg every 14 days o Ezetimibe 10 mg daily o Nitroglycerin 0.4 mg as needed . Interventions: o Discussed cholesterol  goals and benefits of medication for prevention of heart attack / stroke o Discussed benefits of Cardiac Rehab for cholesterol lowering and risk reduction . Patient self care activities - Over the next 180 days, patient will: o Continue medications as prescribed o Continue Cardiac Rehab  Diabetes Lab Results  Component Value Date/Time   HGBA1C 8.3 (H) 05/15/2020 05:22 PM   HGBA1C 8.0 (A) 05/06/2020 10:03 AM   HGBA1C 9.2 (H) 02/20/2020 12:39 PM .  Pharmacist Clinical Goal(s): o Over the next 180 days, patient will work with PharmD and providers to achieve A1c goal <8% . Current regimen:  o Jardiance 10 mg daily o Novolin 70/30 31 units with breakfast . Interventions: o Discussed A1c goals and benefits of medication for prevention of diabetic complications . Patient self care activities - Over the next 180 days, patient will: o Check blood sugar in the morning before eating or drinking, document, and provide at future appointments o Contact provider with any episodes of hypoglycemia  Atrial Fibrillation . Pharmacist Clinical Goal(s) o Over the next 180 days, patient will work with PharmD and providers to optimize therapy . Current regimen:  o Carvedilol 6.25 mg twice a day o Eliquis 5 mg twice a day . Interventions: o Discussed bleeding risk associated with Eliquis (especially with addition of clopidogrel)  . Patient self care activities - Over the next 180 days, patient will: o Monitor for signs and symptoms of bleeding o Avoid NSAIDs - ibuprofen, naproxen  Osteopenia .  Pharmacist Clinical Goal(s) o Over the next 180 days, patient will work with PharmD and providers to optimize therapy . Current regimen:  o Vitamin D 1000 IU daily . Interventions: o Recommend calcium 500-600 mg daily in addition to dietary calcium . Patient self care activities - Over the next 180 days, patient will: o Order Citracal through [facility] Mail order as part of OTC benefit  Medication  management . Pharmacist Clinical Goal(s): o Over the next 180 days, patient will work with PharmD and providers to maintain optimal medication adherence . Current pharmacy: [facility] . Interventions o Comprehensive medication review performed. o Continue current medication management strategy . Patient self care activities - Over the next 180 days, patient will: o Focus on medication adherence by pill box o Take medications as prescribed o Report any questions or concerns to PharmD and/or provider(s)  Initial goal documentation      [name] was given information about Chronic Care Management services today including:  1. CCM service includes personalized support from designated clinical staff supervised by her physician, including individualized plan of care and coordination with other care providers 2. 24/7 contact phone numbers for assistance for urgent and routine care needs. 3. Standard insurance, coinsurance, copays and deductibles apply for chronic care management only during months in which we provide at least 20 minutes of these services. Most insurances cover these services at 100%, however patients may be responsible for any copay, coinsurance and/or deductible if applicable. This service may help you avoid the need for more expensive face-to-face services. 4. Only one practitioner may furnish and bill the service in a calendar month. 5. The patient may stop CCM services at any time (effective at the end of the month) by phone call to the office staff.  Patient agreed to services and verbal consent obtained.   Patient verbalizes understanding of instructions provided today.  Telephone follow up appointment with pharmacy team member scheduled for: 6 months  Charlene Brooke, PharmD, BCACP Clinical Pharmacist [facility] protect organs, store calcium, anchor muscles, and support the whole body. Keeping your  bones strong is important, especially as you get older. You can take actions to help keep your bones strong and healthy. Why is keeping my bones healthy important?  Keeping your bones healthy is important because your body constantly replaces bone cells. Cells get old, and new cells take their place. As we age, we lose bone cells because the body may not be able to make enough new cells to replace the old cells. The amount of bone cells and bone tissue you have is referred to as bone mass. The higher your bone mass, the stronger your bones. The aging process leads to an overall loss of bone mass in the body, which can increase the likelihood of:  Joint pain and stiffness.  Broken bones.  A condition in which the bones become weak and brittle (osteoporosis). A large decline in bone mass occurs in older adults. In women, it occurs about the time of menopause. What actions can I take to keep my bones healthy? Good health habits are important for maintaining healthy bones. This includes eating nutritious foods and exercising regularly. To have healthy bones, you need to get enough of the right minerals and vitamins. Most nutrition experts recommend getting these nutrients from the foods that you eat. In some cases, taking supplements may also be recommended. Doing certain types of exercise is also important for  bone health. What are the nutritional recommendations for healthy bones?  Eating a well-balanced diet with plenty of calcium and vitamin D will help to protect your bones. Nutritional recommendations vary from person to person. Ask your health care provider what is healthy for you. Here are some general guidelines. Get enough calcium Calcium is the most important (essential) mineral for bone health. Most people can get enough calcium from their diet, but supplements may be recommended for people who are at risk for osteoporosis. Good sources of calcium include:  Dairy products, such as low-fat  or nonfat milk, cheese, and yogurt.  Dark green leafy vegetables, such as bok choy and broccoli.  Calcium-fortified foods, such as orange juice, cereal, bread, soy beverages, and tofu products.  Nuts, such as almonds. Follow these recommended amounts for daily calcium intake:  Children, age 28-3: 700 mg.  Children, age 54-8: 1,000 mg.  Children, age 94-13: 1,300 mg.  Teens, age 32-18: 1,300 mg.  Adults, age 37-50: 1,000 mg.  Adults, age 72-70: ? Men: 1,000 mg. ? Women: 1,200 mg.  Adults, age 84 or older: 1,200 mg.  Pregnant and breastfeeding females: ? Teens: 1,300 mg. ? Adults: 1,000 mg. Get enough vitamin D Vitamin D is the most essential vitamin for bone health. It helps the body absorb calcium. Sunlight stimulates the skin to make vitamin D, so be sure to get enough sunlight. If you live in a cold climate or you do not get outside often, your health care provider may recommend that you take vitamin D supplements. Good sources of vitamin D in your diet include:  Egg yolks.  Saltwater fish.  Milk and cereal fortified with vitamin D. Follow these recommended amounts for daily vitamin D intake:  Children and teens, age 28-18: 600 international units.  Adults, age 57 or younger: 400-800 international units.  Adults, age 74 or older: 800-1,000 international units. Get other important nutrients Other nutrients that are important for bone health include:  Phosphorus. This mineral is found in meat, poultry, dairy foods, nuts, and legumes. The recommended daily intake for adult men and adult women is 700 mg.  Magnesium. This mineral is found in seeds, nuts, dark green vegetables, and legumes. The recommended daily intake for adult men is 400-420 mg. For adult women, it is 310-320 mg.  Vitamin K. This vitamin is found in green leafy vegetables. The recommended daily intake is 120 mg for adult men and 90 mg for adult women. What type of physical activity is best for building  and maintaining healthy bones? Weight-bearing and strength-building activities are important for building and maintaining healthy bones. Weight-bearing activities cause muscles and bones to work against gravity. Strength-building activities increase the strength of the muscles that support bones. Weight-bearing and muscle-building activities include:  Walking and hiking.  Jogging and running.  Dancing.  Gym exercises.  Lifting weights.  Tennis and racquetball.  Climbing stairs.  Aerobics. Adults should get at least 30 minutes of moderate physical activity on most days. Children should get at least 60 minutes of moderate physical activity on most days. Ask your health care provider what type of exercise is best for you. How can I find out if my bone mass is low? Bone mass can be measured with an X-ray test called a bone mineral density (BMD) test. This test is recommended for all women who are age 84 or older. It may also be recommended for:  Men who are age 71 or older.  People who are at risk  for osteoporosis because of: ? Having bones that break easily. ? Having a long-term disease that weakens bones, such as kidney disease or rheumatoid arthritis. ? Having menopause earlier than normal. ? Taking medicine that weakens bones, such as steroids, thyroid hormones, or hormone treatment for breast cancer or prostate cancer. ? Smoking. ? Drinking three or more alcoholic drinks a day. If you find that you have a low bone mass, you may be able to prevent osteoporosis or further bone loss by changing your diet and lifestyle. Where can I find more information? For more information, check out the following websites:  [place]: AviationTales.fr  [employer] [facility]: www.bones.SouthExposed.es  International Osteoporosis Foundation: Administrator.iofbonehealth.org Summary  The aging process leads to an overall loss of bone mass in the body, which can increase the  likelihood of broken bones and osteoporosis.  Eating a well-balanced diet with plenty of calcium and vitamin D will help to protect your bones.  Weight-bearing and strength-building activities are also important for building and maintaining strong bones.  Bone mass can be measured with an X-ray test called a bone mineral density (BMD) test. This information is not intended to replace advice given to you by your health care provider. Make sure you discuss any questions you have with your health care provider. Document Revised: 01/09/2018 Document Reviewed: 01/09/2018 Elsevier Patient Education  2020 [employer].

## 2020-08-18 NOTE — Chronic Care Management (AMB) (Signed)
Chronic Care Management Pharmacy  Name: [name]  MRN: [id number] DOB: Jan 10, 1931   Chief Complaint/ HPI  [name],  86 y.o. , female presents for their Initial CCM visit with the clinical pharmacist via telephone due to COVID-19 Pandemic  PCP : [name], MD Patient Care Team: [name], MD as PCP - General (Internal Medicine) Debara Pickett Nadean Corwin, MD as PCP - Cardiology (Cardiology) Bensimhon, Shaune Pascal, MD as PCP - Advanced Heart Failure (Cardiology) Chesley Mires, MD as Consulting Physician (Pulmonary Disease) Renato Shin, MD as Consulting Physician (Endocrinology) Bensimhon, Shaune Pascal, MD as Consulting Physician (Cardiology) Charlton Haws, [facility] as Pharmacist (Pharmacist)  Their chronic conditions include: Hypertension, Hyperlipidemia, Diabetes, Atrial Fibrillation, Heart Failure, Coronary Artery Disease, COPD, Chronic Kidney Disease, Osteopenia, Osteoarthritis and PAD  Born in [place], lived in [place] for 40 years and moved back here 25 years ago. Son and Daughter, 2 grandchildren. 5 months ago moved into a retirement community. 3 chef-cooked meals, activities, exercise, yoga, games, concerts, carnivals.   Sleeps with CPAP 6-8 hrs, wakes up a few times a night.   Office Visits: 02/20/20 Dr Quay Burow OV: referred to allergist for rash, referred to podiatry for foot pain. Referred to pulmonary to adjust CPAP.  Consult Visit: 08/13/20 Dr Haroldine Laws (CHF clinic): chronic f/u for CAD, CHF, PAH, Afib - conditions stable, no med changes. Enrolled in cardiac rehab.  07/15/20 Dr Halford Chessman (pulmonary): f/u for OSA, COPD. Compliant with CPAP, stop Spiriva d/t cost and not helping.  07/03/20 Dr Debara Pickett (cardiolgy): f/u lipid clinic. LDL improved 207 > 140 on Repatha, then to 96 with ezetimibe. Will work on lifestyle modification, if no improvement will try bempedoic acid.  05/15/20 - 05/17/20 Hospitalization for unstable angina, DES x 2. Plan DAPT (clopidogrel + aspirin) x 1 month  then clopidogrel for 1 year (also on Eliquis)  05/06/20 Dr Loanne Drilling (endocrine): Dx 1994, insulin since 2007. QD insulin after poor results with [facility]. Increased insulin to 31 units  04/30/20 Dr Scot Dock (Vascular): f/u angioplasty and stenting (04/12/2019)  Allergies  Allergen Reactions  . Levaquin [Levofloxacin] Shortness Of Breath and Swelling    angioedema  . Lobster [Shellfish Allergy] Other (See Comments)    angioedema  . Penicillins Rash    Has patient had a PCN reaction causing immediate rash, facial/tongue/throat swelling, SOB or lightheadedness with hypotension: Yes Has patient had a PCN reaction causing severe rash involving mucus membranes or skin necrosis: No Has patient had a PCN reaction that required hospitalization: No Has patient had a PCN reaction occurring within the last 10 years: No If all of the above answers are "NO", then may proceed with Cephalosporin use.  . Valsartan Other (See Comments)    REACTION: angioedema  . Codeine Other (See Comments)    Mental status changes  . Lipitor [Atorvastatin] Other (See Comments)    weakness  . Oxycodone Other (See Comments)    hallucinations  . Statins     Made her too weak  . Tramadol   . Amlodipine Besylate Other (See Comments)    REACTION: tingling in lips & gum edema 7/12: talked to patient, states she is tolerating well  . Crestor [Rosuvastatin Calcium] Rash  . Tramadol Hcl Nausea And Vomiting    Medications: Outpatient Encounter Medications as of 08/18/2020  Medication Sig  . acetaminophen (TYLENOL) 500 MG tablet Take 1,000 mg by mouth every 6 (six) hours as needed (pain).  Marland Kitchen amLODipine (NORVASC) 5 MG tablet Take 1 tablet (5  mg total) by mouth daily.  Marland Kitchen apixaban (ELIQUIS) 5 MG TABS tablet Take 5 mg by mouth 2 (two) times daily.  . carvedilol (COREG) 6.25 MG tablet Take 6.25 mg by mouth 2 (two) times daily with a meal.  . cholecalciferol (VITAMIN D) 1000 units tablet Take 1,000 Units by mouth daily after supper.  .  clopidogrel (PLAVIX) 75 MG tablet Take 1 tablet (75 mg total) by mouth daily with breakfast.  . empagliflozin (JARDIANCE) 10 MG TABS tablet Take 10 mg by mouth daily before breakfast.  . Evolocumab (REPATHA SURECLICK) 563 MG/ML SOAJ Inject 1 Dose into the skin every 14 (fourteen) days.  Marland Kitchen ezetimibe (ZETIA) 10 MG tablet Take 1 tablet (10 mg total) by mouth daily.  . furosemide (LASIX) 80 MG tablet TAKE 1 TABLET BY MOUTH TWO  TIMES DAILY  . glucose blood (ONETOUCH VERIO) test strip Use to monitor glucose levels 2 times daily  . insulin NPH-regular Human (NOVOLIN 70/30 RELION) (70-30) 100 UNIT/ML injection Inject 31 Units into the skin daily with breakfast. And syringes 1/day  . Lancets (ONETOUCH DELICA PLUS OVFIEP32R) MISC USE TO MONITOR GLUCOSE  LEVELS TWICE DAILY  . Multiple Vitamin (MULTIVITAMIN WITH MINERALS) TABS tablet Take 1 tablet by mouth daily. Centrum Silver  . nitroGLYCERIN (NITROSTAT) 0.4 MG SL tablet Place 1 tablet (0.4 mg total) under the tongue every 5 (five) minutes as needed for chest pain. Reported on 01/28/2016  . potassium chloride SA (KLOR-CON) 20 MEQ tablet Take 20 mEq by mouth 2 (two) times daily.  Marland Kitchen CALCIUM-MAG-VIT C-VIT D PO Take 1 tablet by mouth daily. (Patient not taking: Reported on 08/18/2020)   No facility-administered encounter medications on file as of 08/18/2020.     Current Diagnosis/Assessment:  SDOH Interventions     Most Recent Value  SDOH Interventions  Financial Strain Interventions Intervention Not Indicated      Goals Addressed            This Visit's Progress   . Pharmacy Care Plan       CARE PLAN ENTRY (see longitudinal plan of care for additional care plan information)  Current Barriers:  . Chronic Disease Management support, education, and care coordination needs related to Hypertension, Hyperlipidemia, Diabetes, Atrial Fibrillation, Heart Failure, Coronary Artery Disease, and Osteopenia   Hypertension / Heart Failure BP Readings from  Last 3 Encounters:  08/13/20 (!) 158/76  07/17/20 (!) 130/62  07/15/20 106/74 .  Pharmacist Clinical Goal(s): o Over the next 180 days, patient will work with PharmD and providers to maintain BP goal <140/90 . Current regimen:  o Amlodipine 5 mg daily o Carvedilol 6.25 mg twice a day o Furosemide 80 mg twice a day o Potassium 20 mEq twice a day o Jardiance 10 mg daily . Interventions: o Discussed BP goals and benefits of medication for prevention of heart attack / stroke o Discussed weighing daily and when to increase furosemide if weight gain of 3+ lbs overnight . Patient self care activities - Over the next 180 days, patient will: o Check BP 3 times a week, document, and provide at future appointments o Ensure daily salt intake < 2300 mg/day  Hyperlipidemia / Coronary artery disease Lab Results  Component Value Date/Time   LDLCALC 96 05/16/2020 04:35 AM   LDLDIRECT 144.6 02/03/2010 08:38 AM .  Pharmacist Clinical Goal(s): o Over the next 180 days, patient will work with PharmD and providers to achieve LDL goal < 70 . Current regimen:  o Clopidogrel 65 mg daily o  Repatha 140 mg every 14 days o Ezetimibe 10 mg daily o Nitroglycerin 0.4 mg as needed . Interventions: o Discussed cholesterol goals and benefits of medication for prevention of heart attack / stroke o Discussed benefits of Cardiac Rehab for cholesterol lowering and risk reduction . Patient self care activities - Over the next 180 days, patient will: o Continue medications as prescribed o Continue Cardiac Rehab  Diabetes Lab Results  Component Value Date/Time   HGBA1C 8.3 (H) 05/15/2020 05:22 PM   HGBA1C 8.0 (A) 05/06/2020 10:03 AM   HGBA1C 9.2 (H) 02/20/2020 12:39 PM .  Pharmacist Clinical Goal(s): o Over the next 180 days, patient will work with PharmD and providers to achieve A1c goal <8% . Current regimen:  o Jardiance 10 mg daily o Novolin 70/30 31 units with breakfast . Interventions: o Discussed A1c  goals and benefits of medication for prevention of diabetic complications . Patient self care activities - Over the next 180 days, patient will: o Check blood sugar in the morning before eating or drinking, document, and provide at future appointments o Contact provider with any episodes of hypoglycemia  Atrial Fibrillation . Pharmacist Clinical Goal(s) o Over the next 180 days, patient will work with PharmD and providers to optimize therapy . Current regimen:  o Carvedilol 6.25 mg twice a day o Eliquis 5 mg twice a day . Interventions: o Discussed bleeding risk associated with Eliquis (especially with addition of clopidogrel)  . Patient self care activities - Over the next 180 days, patient will: o Monitor for signs and symptoms of bleeding o Avoid NSAIDs - ibuprofen, naproxen  Osteopenia . Pharmacist Clinical Goal(s) o Over the next 180 days, patient will work with PharmD and providers to optimize therapy . Current regimen:  o Vitamin D 1000 IU daily . Interventions: o Recommend calcium 500-600 mg daily in addition to dietary calcium . Patient self care activities - Over the next 180 days, patient will: o Order Citracal through [facility] Mail order as part of OTC benefit  Medication management . Pharmacist Clinical Goal(s): o Over the next 180 days, patient will work with PharmD and providers to maintain optimal medication adherence . Current pharmacy: [facility] . Interventions o Comprehensive medication review performed. o Continue current medication management strategy . Patient self care activities - Over the next 180 days, patient will: o Focus on medication adherence by pill box o Take medications as prescribed o Report any questions or concerns to PharmD and/or provider(s)  Initial goal documentation       Heart Failure   Type: Diastolic, Grade III dysfunction  Last ejection fraction 05/16/2020: 50-55% NYHA Class II-III  BP goal is:  <140/90 BP Readings from  Last 3 Encounters:  08/13/20 (!) 158/76  07/17/20 (!) 130/62  07/15/20 106/74   Kidney Function Lab Results  Component Value Date/Time   CREATININE 1.23 (H) 06/09/2020 11:33 AM   CREATININE 1.07 (H) 05/17/2020 04:27 AM   GFR 53.03 (L) 02/20/2020 12:39 PM   GFRNONAA 40 (L) 06/09/2020 11:33 AM   GFRAA 46 (L) 06/09/2020 11:33 AM   K 4.2 06/09/2020 11:33 AM   K 4.8 05/17/2020 04:27 AM   Patient has failed these meds in past: n/a Patient is currently controlled on the following medications:  . Amlodipine 5 mg daily . Carvedilol 6.25 mg BID . Furosemide 80 mg BID . Potassium 20 mEq BID . Jardiance 10 mg daily  We discussed diet and exercise extensively; pt reports she weighs herself daily and  increase furosemide to 2 tablets if she gains > 3 lbs overnight; she reports she used to be allergic to amlodipine, but her cardiologist wanted to give it another try when BP was uncontrolled and she reports no issues since restarting.   Plan  Continue current medications and control with diet and exercise   AFIB   Patient is currently rate controlled. Office heart rates are  Pulse Readings from Last 3 Encounters:  08/13/20 94  07/17/20 70  07/15/20 68   CHA2DS2-VASc Score = 7  The patient's score is based upon: CHF History: 1 HTN History: 1 Age : 2 Diabetes History: 1 Stroke History: 0 Vascular Disease History: 1 Gender: 1  Patient has failed these meds in past: digoxin Patient is currently controlled on the following medications:  . Carvedilol 6.25 mg BID . Eliquis 5 mg BID  We discussed:  Benefits of medications for reducing afib burden and risk for stroke; bleeding risk with Eliquis; avoiding NSAIDs; pt denies bleeding issues, but does bruise easily. Pt is working on Eliquis PAP with cardiology office.  Plan  Continue current medications  Hyperlipidemia   LDL goal < 70 HX CAD (last 05/16/20 PCI w/ DES)  HX PAD s/p stenting  Lipid Panel     Component Value  Date/Time   CHOL 164 05/16/2020 0435   TRIG 90 05/16/2020 0435   TRIG 52 11/10/2006 0845   HDL 50 05/16/2020 0435   LDLCALC 96 05/16/2020 0435   LDLDIRECT 144.6 02/03/2010 0838    Hepatic Function Latest Ref Rng & Units 02/20/2020 01/09/2020 01/31/2019  Total Protein 6.0 - 8.3 g/dL 8.1 8.1 7.6  Albumin 3.5 - 5.2 g/dL 4.1 3.9 4.0  AST 0 - 37 U/L 28 32 21  ALT 0 - 35 U/L _0 Alk Phosphatase 39 - 117 U/L 99 77 94  Total Bilirubin 0.2 - 1.2 mg/dL 0.7 1.0 0.8  Bilirubin, Direct 0.0 - 0.3 mg/dL - - -     The ASCVD Risk score (Los Luceros., et al., 2013) failed to calculate for the following reasons:   The 2013 ASCVD risk score is only valid for ages 14 to 90   The patient has a prior MI or stroke diagnosis   Patient has failed these meds in past: atorvastatin, rosuvastatin Patient is currently uncontrolled on the following medications:  . Clopidogrel 65 mg daily . Repatha 140 mg every 14 days . Ezetimibe 10 mg daily . Nitroglycerin 0.4 mg SL prn  We discussed:  Diet and exercise extensively; pt is attending Cardiac Rehab 3 days a week; LDL has improved significantly over the last year with the help of medications; pt is now working on lifestyle modifications to achieve LDL goal. PT reports she has not needed NTG since before heart attack/DES placement in May.  Pt gets Repatha at no cost due to grant.  Plan  Continue current medications and control with diet and exercise  Diabetes   A1c goal <8%  Recent Relevant Labs: Lab Results  Component Value Date/Time   HGBA1C 8.3 (H) 05/15/2020 05:22 PM   HGBA1C 8.0 (A) 05/06/2020 10:03 AM   HGBA1C 9.2 (H) 02/20/2020 12:39 PM   GFR 53.03 (L) 02/20/2020 12:39 PM   GFR 52.65 (L) 01/31/2019 10:33 AM   MICROALBUR 11.9 (H) 07/31/2018 10:22 AM   MICROALBUR 1.6 09/08/2016 11:59 AM    Last diabetic Eye exam:  Lab Results  Component Value Date/Time   HMDIABEYEEXA No Retinopathy 06/05/2018 12:00 AM  Last diabetic Foot exam: No results  found for: HMDIABFOOTEX   Checking BG: Daily  Recent FBG Readings: "not the best"  Patient has failed these meds in past: Novolog, Lantus, metformin Patient is currently uncontrolled on the following medications: . Jardiance 10 mg daily . Novolin 70/30 31 units with breakfast  We discussed: Pt sees a nutritionist at cardiac rehab; she endorses occasionally sweet treats but overall tries to follow diabetic diet; pt follows with endocrine and denies low blood sugars. Pt reports Vania Rea is affordable.  Plan  Continue current medications and control with diet and exercise   COPD   Last spirometry score: 05/21/2014 FEV1 94% predicted, FEV1/FVC 0.66  Gold Grade: Gold 1 (FEV1>80%) Current COPD Classification:  A (low sx, <2 exacerbations/yr)  Lab Results  Component Value Date/Time   EOSPCT 4.4 02/20/2020 12:39 PM   EOSABS 0.2 02/20/2020 12:39 PM   Patient has failed these meds in past: n/a Patient is currently controlled on the following medications:  . Spiriva  Using maintenance inhaler regularly? Yes Frequency of rescue inhaler use:  never  We discussed:  proper inhaler technique; pt has spoken with pulmonologist about Spiriva, once pt runs out of current supply she is going to attempt trial off of medication given mild symptoms and high cost of medication  Plan  Continue current medications  Osteopenia    Last DEXA Scan: 2018  T-Score femoral neck: -0.6  T-Score total hip: n/a  T-Score lumbar spine: n/a  T-Score forearm radius: -1.4  10-year probability of major osteoporotic fracture: 4.4%  10-year probability of hip fracture: 0.8%  Vit D, 25-Hydroxy  Date Value Ref Range Status  06/15/2010 63 30 - 89 ng/mL Final    Comment:    See lab report for associated comment(s)    Patient is not a candidate for pharmacologic treatment  Patient has failed these meds in past: n/a Patient is currently controlled on the following medications:  . Calcium-Magnesium-Vitamin  C - not taking . Vitamin D 1000 IU daily  We discussed:  Recommend [phone] units of vitamin D daily. Recommend 1200 mg of calcium daily from dietary and supplemental sources. Recommend weight-bearing and muscle strengthening exercises for building and maintaining bone density.  Plan  Continue current medications and control with diet and exercise  Recommend Citracal in addition to Vitamin D  Osteoarthritis   Patient has failed these meds in past: n/a Patient is currently controlled on the following medications:  . Tylenol 500 mg q6h PRN  We discussed:  Pt reports she uses Tylenol daily for arthritis pain, avoiding NSAIDs due to Eliquis.   Plan  Continue current medications  Health Maintenance   Patient is currently controlled on the following medications:  Marland Kitchen Multivitamin  We discussed:  Patient is satisfied with current OTC regimen and denies issues  Plan  Continue current medications  Medication Management   Pt uses [facility] for all medications Uses pill box? Yes Pt endorses 100% compliance  We discussed: Pt is satisfied with mail order pharmacy; she does not have her own car so mail delivery is vital. Also recommended to get OTC medications through [facility] OTC benefit (allowed $40/quarter)  Plan  Continue current medication management strategy    Follow up: 6 month phone visit  Charlene Brooke, PharmD, BCACP Clinical Pharmacist [facility] [phone]

## 2020-08-18 NOTE — Patient Instructions (Addendum)
   Medications reviewed and updated.  Changes include :   none     Please followup in 1 year  

## 2020-08-19 ENCOUNTER — Encounter: Payer: Self-pay | Admitting: Internal Medicine

## 2020-08-19 ENCOUNTER — Ambulatory Visit (INDEPENDENT_AMBULATORY_CARE_PROVIDER_SITE_OTHER): Payer: Medicare Other | Admitting: Internal Medicine

## 2020-08-19 ENCOUNTER — Other Ambulatory Visit: Payer: Self-pay

## 2020-08-19 VITALS — BP 134/82 | HR 68 | Temp 97.9°F | Ht 63.0 in | Wt 168.0 lb

## 2020-08-19 DIAGNOSIS — G629 Polyneuropathy, unspecified: Secondary | ICD-10-CM

## 2020-08-19 DIAGNOSIS — E1121 Type 2 diabetes mellitus with diabetic nephropathy: Secondary | ICD-10-CM

## 2020-08-19 DIAGNOSIS — I10 Essential (primary) hypertension: Secondary | ICD-10-CM | POA: Diagnosis not present

## 2020-08-19 DIAGNOSIS — N1831 Chronic kidney disease, stage 3a: Secondary | ICD-10-CM

## 2020-08-19 DIAGNOSIS — I5032 Chronic diastolic (congestive) heart failure: Secondary | ICD-10-CM

## 2020-08-19 DIAGNOSIS — E782 Mixed hyperlipidemia: Secondary | ICD-10-CM | POA: Diagnosis not present

## 2020-08-19 DIAGNOSIS — Z794 Long term (current) use of insulin: Secondary | ICD-10-CM

## 2020-08-19 NOTE — Assessment & Plan Note (Signed)
Chronic has upcoming appointment with Dr. Loanne Drilling Discussed importance of moderation of sugars and carbohydrates, which she is working on She has been discussing this with a nutritionist at cardiac rehab Continue regular exercise

## 2020-08-19 NOTE — Telephone Encounter (Signed)
Called [employer] to check the status of the patient's application. Representative stated that the patient needed to spend a total of [phone]. There is still $353.95 left to meet this requirement. Called and left patient a message for follow up.

## 2020-08-19 NOTE — Assessment & Plan Note (Signed)
Chronic Euvolemic on exam Following at the heart failure clinic Continue current medications. Rarely takes an extra furosemide

## 2020-08-19 NOTE — Addendum Note (Signed)
Addended by: Karle Barr on: 08/19/2020 04:37 PM   Modules accepted: Orders

## 2020-08-19 NOTE — Assessment & Plan Note (Signed)
Chronic Blood pressure well controlled Continue current medications

## 2020-08-19 NOTE — Assessment & Plan Note (Signed)
Chronic Repatha, Zetia Continue

## 2020-08-19 NOTE — Assessment & Plan Note (Signed)
Chronic, stable 

## 2020-08-19 NOTE — Assessment & Plan Note (Signed)
Chronic Bilateral lower extremities Has been on gabapentin in the past Has intermittent shooting pain and numbness in her feet when inactive Has been some improvement with increased exercise and she will continue this Discussed treatment options including gabapentin, Lyrica and Cymbalta She will monitor for now and continue exercising

## 2020-08-20 ENCOUNTER — Encounter (HOSPITAL_COMMUNITY): Payer: Medicare Other

## 2020-08-20 DIAGNOSIS — E119 Type 2 diabetes mellitus without complications: Secondary | ICD-10-CM | POA: Diagnosis not present

## 2020-08-20 DIAGNOSIS — Z947 Corneal transplant status: Secondary | ICD-10-CM | POA: Diagnosis not present

## 2020-08-20 DIAGNOSIS — H5203 Hypermetropia, bilateral: Secondary | ICD-10-CM | POA: Diagnosis not present

## 2020-08-20 DIAGNOSIS — H524 Presbyopia: Secondary | ICD-10-CM | POA: Diagnosis not present

## 2020-08-20 DIAGNOSIS — H52203 Unspecified astigmatism, bilateral: Secondary | ICD-10-CM | POA: Diagnosis not present

## 2020-08-21 ENCOUNTER — Other Ambulatory Visit: Payer: Self-pay

## 2020-08-21 ENCOUNTER — Ambulatory Visit: Payer: Medicare Other | Admitting: Endocrinology

## 2020-08-21 ENCOUNTER — Encounter: Payer: Self-pay | Admitting: Endocrinology

## 2020-08-21 VITALS — BP 130/62 | HR 84 | Resp 18 | Ht 63.0 in | Wt 167.2 lb

## 2020-08-21 DIAGNOSIS — E1121 Type 2 diabetes mellitus with diabetic nephropathy: Secondary | ICD-10-CM | POA: Diagnosis not present

## 2020-08-21 DIAGNOSIS — N1831 Chronic kidney disease, stage 3a: Secondary | ICD-10-CM | POA: Diagnosis not present

## 2020-08-21 DIAGNOSIS — Z794 Long term (current) use of insulin: Secondary | ICD-10-CM | POA: Diagnosis not present

## 2020-08-21 LAB — POCT GLYCOSYLATED HEMOGLOBIN (HGB A1C): Hemoglobin A1C: 7.8 % — AB (ref 4.0–5.6)

## 2020-08-21 NOTE — Progress Notes (Signed)
Subjective:    Patient ID: [name], female    DOB: October 14, 1931, 86 y.o.   MRN: [id number]  HPI Pt returns for f/u of diabetes mellitus:  DM type: Insulin-requiring type 2.   Dx'ed: 9735 Complications: CAD, PN, PAD, and stage 3 CRI.  Therapy: insulin since 2007, and Jardiance.   GDM: never.  DKA: never. Severe hypoglycemia: never.   Pancreatitis: never.   SDOH: she takes human insulin, due to cost.   Other: she changed to qd insulin, after poor results with multiple daily injections; she was changed from lantus to QAM NPH, then to 70/30, due to am hypoglycemia and PM hyperglycemia.   Interval history: no cbg record, but states cbg varies from 113-250, but most are in the mid-100's.  It is in general higher as the day goes on.     Past Medical History:  Diagnosis Date  . Anemia    iron defficiency  . Anginal pain ([place])   . Arthritis    "knees, feet, hands; joints" (05/27/2015)  . Asthma   . Atrial fibrillation or flutter    s/p RFCA 7/08;   s/p DCCV in past;   previously on amiodarone;  amio stopped due to lung toxicity  . CAD (coronary artery disease)    s/p NSTEMI tx with BMS to OM1 3/08;  cath 3/08: pOM 99% tx with PCI, pLAD 20%, ? mod stenosis at the AM  . CAP (community acquired pneumonia) 05/24/2015  . Chronic diastolic heart failure (HCC)    echo 11/11:  EF 55-60%, severe LVH, mod LAE, mild MR, mildly increased PASP  . COPD (chronic obstructive pulmonary disease) ([place])   . Degenerative joint disease   . Dystrophy, corneal stromal   . Heart murmur   . HLD (hyperlipidemia)   . HTN (hypertension)    essential nos  . Hypopotassemia    PMH of  . Muscle pain   . Myocardial infarction ([place]) 2008  . OSA on CPAP   . Osteoporosis   . Pneumonia 05/27/2015  . Protein calorie malnutrition ([place])   . Rash and nonspecific skin eruption    both arms,awaiting bio   dr Delman Cheadle  . Seasonal allergies   . Shortness of breath dyspnea   . Type II diabetes mellitus ([place])    Dr  Loanne Drilling    Past Surgical History:  Procedure Laterality Date  . A FLUTTER ABLATION     Dr Lovena Le  . ABDOMINAL AORTOGRAM W/LOWER EXTREMITY Bilateral 04/11/2020   Procedure: ABDOMINAL AORTOGRAM W/LOWER EXTREMITY;  Surgeon: Angelia Mould, MD;  Location: [facility];  Service: Cardiovascular;  Laterality: Bilateral;  . CATARACT EXTRACTION W/ INTRAOCULAR LENS  IMPLANT, BILATERAL Bilateral   . CHOLECYSTECTOMY    . COLONOSCOPY  6/07   2 polyps Dr. Kinnie Feil in [place]  . colonoscopy with polypectomy      X 2; [facility]  . CORNEAL TRANSPLANT Bilateral   . CORONARY ANGIOPLASTY WITH STENT PLACEMENT  02/2007   BMS w/Dr Lovena Le  . CORONARY ATHERECTOMY N/A 05/16/2020   Procedure: CORONARY ATHERECTOMY;  Surgeon: Martinique, Peter M, MD;  Location: [facility];  Service: Cardiovascular;  Laterality: N/A;  . CORONARY BALLOON ANGIOPLASTY N/A 05/16/2020   Procedure: CORONARY BALLOON ANGIOPLASTY;  Surgeon: Martinique, Peter M, MD;  Location: [place] [facility];  Service: Cardiovascular;  Laterality: N/A;  . CORONARY STENT INTERVENTION N/A 05/16/2020   Procedure: CORONARY STENT INTERVENTION;  Surgeon: Martinique, Peter M, MD;  Location: [facility] INVASIVE [facility]  LAB;  Service: Cardiovascular;  Laterality: N/A;  . EYE SURGERY    . gravid 2 para 2    . INTRAVASCULAR ULTRASOUND/IVUS N/A 05/16/2020   Procedure: Intravascular Ultrasound/IVUS;  Surgeon: Martinique, Peter M, MD;  Location: [place] [facility];  Service: Cardiovascular;  Laterality: N/A;  . KNEE CARTILAGE SURGERY Right   . LEFT AND RIGHT HEART CATHETERIZATION WITH CORONARY ANGIOGRAM N/A 05/07/2014   Procedure: LEFT AND RIGHT HEART CATHETERIZATION WITH CORONARY ANGIOGRAM;  Surgeon: Peter M Martinique, MD;  Location: [facility] CATH LAB;  Service: Cardiovascular;  Laterality: N/A;  . PERIPHERAL VASCULAR INTERVENTION  04/11/2020   Procedure: PERIPHERAL VASCULAR INTERVENTION;  Surgeon: Angelia Mould, MD;  Location: [place] [facility];  Service: Cardiovascular;;  .  RIGHT/LEFT HEART CATH AND CORONARY ANGIOGRAPHY N/A 05/16/2020   Procedure: RIGHT/LEFT HEART CATH AND CORONARY ANGIOGRAPHY;  Surgeon: Jolaine Artist, MD;  Location: [facility];  Service: Cardiovascular;  Laterality: N/A;  . TOTAL KNEE ARTHROPLASTY Right 06/28/2016   Procedure: TOTAL KNEE ARTHROPLASTY;  Surgeon: Frederik Pear, MD;  Location: [place] [place];  Service: Orthopedics;  Laterality: Right;    Social History   Socioeconomic History  . Marital status: Widowed    Spouse name: Not on file  . Number of children: 2  . Years of education: 16  . Highest education level: Bachelor's degree (e.g., BA, AB, BS)  Occupational History  . Occupation: Recruitment consultant Retired  Tobacco Use  . Smoking status: Former Smoker    Packs/day: 0.25    Years: 18.00    Pack years: 4.50    Types: Cigarettes    Quit date: 12/27/1968    Years since quitting: 51.6  . Smokeless tobacco: Never Used  . Tobacco comment: smoked [place], up to 1 pp week  Vaping Use  . Vaping Use: Never used  Substance and Sexual Activity  . Alcohol use: Yes    Comment: occ  . Drug use: No  . Sexual activity: Never  Other Topics Concern  . Not on file  Social History Narrative   Teacher. Widowed. Rarely drinks cafeine.    Social Determinants of Health   Financial Resource Strain: Low Risk   . Difficulty of Paying Living Expenses: Not very hard  Food Insecurity:   . Worried About Charity fundraiser in the Last Year: Not on file  . Ran Out of Food in the Last Year: Not on file  Transportation Needs:   . Lack of Transportation (Medical): Not on file  . Lack of Transportation (Non-Medical): Not on file  Physical Activity: Insufficiently Active  . Days of Exercise per Week: 4 days  . Minutes of Exercise per Session: 30 min  Stress: No Stress Concern Present  . Feeling of Stress : Only a little  Social Connections: Moderately Integrated  . Frequency of Communication with Friends and Family: More than three times a week  .  Frequency of Social Gatherings with Friends and Family: More than three times a week  . Attends Religious Services: 1 to 4 times per year  . Active Member of Clubs or Organizations: Yes  . Attends Archivist Meetings: 1 to 4 times per year  . Marital Status: Widowed  Intimate Partner Violence:   . Fear of Current or Ex-Partner: Not on file  . Emotionally Abused: Not on file  . Physically Abused: Not on file  . Sexually Abused: Not on file    Current Outpatient Medications on File Prior to Visit  Medication  Sig Dispense Refill  . acetaminophen (TYLENOL) 500 MG tablet Take 1,000 mg by mouth every 6 (six) hours as needed (pain).    Marland Kitchen amLODipine (NORVASC) 5 MG tablet Take 1 tablet (5 mg total) by mouth daily. 30 tablet 6  . apixaban (ELIQUIS) 5 MG TABS tablet Take 5 mg by mouth 2 (two) times daily.    Marland Kitchen CALCIUM-MAG-VIT C-VIT D PO Take 1 tablet by mouth daily.     . carvedilol (COREG) 6.25 MG tablet Take 6.25 mg by mouth 2 (two) times daily with a meal.    . cholecalciferol (VITAMIN D) 1000 units tablet Take 1,000 Units by mouth daily after supper.    . clopidogrel (PLAVIX) 75 MG tablet Take 1 tablet (75 mg total) by mouth daily with breakfast. 90 tablet 2  . empagliflozin (JARDIANCE) 10 MG TABS tablet Take 10 mg by mouth daily before breakfast. 90 tablet 3  . Evolocumab (REPATHA SURECLICK) 381 MG/ML SOAJ Inject 1 Dose into the skin every 14 (fourteen) days. 2 pen 11  . ezetimibe (ZETIA) 10 MG tablet Take 1 tablet (10 mg total) by mouth daily. 30 tablet 10  . furosemide (LASIX) 80 MG tablet TAKE 1 TABLET BY MOUTH TWO  TIMES DAILY 180 tablet 3  . glucose blood (ONETOUCH VERIO) test strip Use to monitor glucose levels 2 times daily 200 each 5  . insulin NPH-regular Human (NOVOLIN 70/30 RELION) (70-30) 100 UNIT/ML injection Inject 31 Units into the skin daily with breakfast. And syringes 1/day 10 mL 11  . Lancets (ONETOUCH DELICA PLUS [id number]) MISC USE TO MONITOR GLUCOSE  LEVELS TWICE  DAILY 200 each 3  . Multiple Vitamin (MULTIVITAMIN WITH MINERALS) TABS tablet Take 1 tablet by mouth daily. Centrum Silver    . nitroGLYCERIN (NITROSTAT) 0.4 MG SL tablet Place 1 tablet (0.4 mg total) under the tongue every 5 (five) minutes as needed for chest pain. Reported on 01/28/2016 30 tablet 3  . potassium chloride SA (KLOR-CON) 20 MEQ tablet Take 20 mEq by mouth 2 (two) times daily.     No current facility-administered medications on file prior to visit.    Allergies  Allergen Reactions  . Levaquin [Levofloxacin] Shortness Of Breath and Swelling    angioedema  . Lobster [Shellfish Allergy] Other (See Comments)    angioedema  . Penicillins Rash    Has patient had a PCN reaction causing immediate rash, facial/tongue/throat swelling, SOB or lightheadedness with hypotension: Yes Has patient had a PCN reaction causing severe rash involving mucus membranes or skin necrosis: No Has patient had a PCN reaction that required hospitalization: No Has patient had a PCN reaction occurring within the last 10 years: No If all of the above answers are "NO", then may proceed with Cephalosporin use.  . Valsartan Other (See Comments)    REACTION: angioedema  . Codeine Other (See Comments)    Mental status changes  . Lipitor [Atorvastatin] Other (See Comments)    weakness  . Oxycodone Other (See Comments)    hallucinations  . Statins     Made her too weak  . Tramadol   . Amlodipine Besylate Other (See Comments)    REACTION: tingling in lips & gum edema 7/12: talked to patient, states she is tolerating well  . Crestor [Rosuvastatin Calcium] Rash  . Tramadol Hcl Nausea And Vomiting    Family History  Problem Relation Age of Onset  . Diabetes Mother   . Hypertension Mother   . Transient ischemic attack Mother   .  Arthritis Mother   . Heart attack Father 82  . Arthritis Father   . Hypertension Father   . Breast cancer Maternal Aunt   . Arthritis Maternal Aunt     BP 130/62   Pulse 84    Resp 18   Ht 5\' 3"  (1.6 m)   Wt 167 lb 3.2 oz (75.8 kg)   SpO2 91%   BMI 29.62 kg/m    Review of Systems He denies hypoglycemia    Objective:   Physical Exam VITAL SIGNS:  See vs page GENERAL: no distress Pulses: dorsalis pedis intact bilat.   MSK: no deformity of the feet CV: no leg edema Skin:  no ulcer on the feet.  normal color and temp on the feet. Neuro: sensation is intact to touch on the feet.    Lab Results  Component Value Date   HGBA1C 7.8 (A) 08/21/2020   Lab Results  Component Value Date   CREATININE 1.23 (H) 06/09/2020   BUN 23 06/09/2020   NA 140 06/09/2020   K 4.2 06/09/2020   CL 102 06/09/2020   CO2 25 06/09/2020      Assessment & Plan:  Insulin-requiring type 2 DM, with PAD: this is the best control this pt should aim for, given this regimen, which does match insulin to her changing needs throughout the day  Patient Instructions  Please continue the same medications.  On this type of insulin schedule, you should eat meals on a regular schedule.  If a meal is missed or significantly delayed, your blood sugar could go low.  check your blood sugar twice a day.  vary the time of day when you check, between before the 3 meals, and at bedtime.  also check if you have symptoms of your blood sugar being too high or too low.  please keep a record of the readings and bring it to your next appointment here (or you can bring the meter itself).  You can write it on any piece of paper.  please call us sooner if your blood sugar goes below 70, or if you have a lot of readings over 200. Please come back for a follow-up appointment in  months.

## 2020-08-21 NOTE — Patient Instructions (Addendum)
Please continue the same medications.  On this type of insulin schedule, you should eat meals on a regular schedule.  If a meal is missed or significantly delayed, your blood sugar could go low.  check your blood sugar twice a day.  vary the time of day when you check, between before the 3 meals, and at bedtime.  also check if you have symptoms of your blood sugar being too high or too low.  please keep a record of the readings and bring it to your next appointment here (or you can bring the meter itself).  You can write it on any piece of paper.  please call us sooner if your blood sugar goes below 70, or if you have a lot of readings over 200. Please come back for a follow-up appointment in  months.

## 2020-08-22 ENCOUNTER — Encounter (HOSPITAL_COMMUNITY)
Admission: RE | Admit: 2020-08-22 | Discharge: 2020-08-22 | Disposition: A | Discharge status: Home / Self Care | Payer: Medicare Other | Source: Ambulatory Visit | Attending: Internal Medicine | Admitting: Internal Medicine

## 2020-08-22 DIAGNOSIS — Z955 Presence of coronary angioplasty implant and graft: Secondary | ICD-10-CM | POA: Diagnosis not present

## 2020-08-22 DIAGNOSIS — I214 Non-ST elevation (NSTEMI) myocardial infarction: Secondary | ICD-10-CM | POA: Diagnosis not present

## 2020-08-22 NOTE — Telephone Encounter (Signed)
Patient called and left me a message to follow up. I called her back and had to leave a message as well.

## 2020-08-25 ENCOUNTER — Encounter (HOSPITAL_COMMUNITY)
Admission: RE | Admit: 2020-08-25 | Discharge: 2020-08-25 | Disposition: A | Discharge status: Home / Self Care | Payer: Medicare Other | Source: Ambulatory Visit | Attending: Internal Medicine | Admitting: Internal Medicine

## 2020-08-25 ENCOUNTER — Other Ambulatory Visit: Payer: Self-pay

## 2020-08-25 DIAGNOSIS — Z955 Presence of coronary angioplasty implant and graft: Secondary | ICD-10-CM | POA: Diagnosis not present

## 2020-08-25 DIAGNOSIS — I214 Non-ST elevation (NSTEMI) myocardial infarction: Secondary | ICD-10-CM | POA: Diagnosis not present

## 2020-08-25 LAB — GLUCOSE, CAPILLARY: Glucose-Capillary: 129 mg/dL — ABNORMAL HIGH (ref 70–99)

## 2020-08-26 NOTE — Progress Notes (Signed)
Reviewed chart and insurance data for medication adherence. Patient does not have any gaps in adherence and is not in danger of failing any Medicare adherence measures. No further action required.  Unable to tolerate statins due to muscle weakness and rash with multiple statins.

## 2020-08-27 ENCOUNTER — Other Ambulatory Visit: Payer: Self-pay

## 2020-08-27 ENCOUNTER — Encounter (HOSPITAL_COMMUNITY)
Admission: RE | Admit: 2020-08-27 | Discharge: 2020-08-27 | Disposition: A | Discharge status: Home / Self Care | Payer: Medicare Other | Source: Ambulatory Visit | Attending: Internal Medicine | Admitting: Internal Medicine

## 2020-08-27 DIAGNOSIS — I214 Non-ST elevation (NSTEMI) myocardial infarction: Secondary | ICD-10-CM | POA: Diagnosis not present

## 2020-08-27 DIAGNOSIS — Z955 Presence of coronary angioplasty implant and graft: Secondary | ICD-10-CM | POA: Diagnosis not present

## 2020-08-27 NOTE — Telephone Encounter (Signed)
Spoke with patient, she is likely not going to meet that OOP expense required to be approved for Eliquis assistance. She was wondering what else could be done or what other options were available.   The only other option she would have would be to switch to Warfarin. Called and left her a message to follow up with me.

## 2020-08-28 NOTE — Telephone Encounter (Signed)
Spoke with patient. She is going to get one of her children to help her pay for a months worth of medication. She asked if we have samples that can help her while she waits for the shipment to come (says that it usually takes a week to receive).   She can't switch to Warfarin because she has a hard time getting to so many appointments living in a retirement community. She would prefer to stay on Eliquis for the freedom that it provides her.  Will check and see if we can supply samples while she waits on the prescription to be delivered.

## 2020-08-28 NOTE — Telephone Encounter (Signed)
Medication Samples have been provided to the patient.  Drug name: Eliquis       Strength: 5mg         Qty: 3  LOT: [id number]  Exp.Date: 8/23  Dosing instructions: Take 1 tab Twice daily   The patient has been instructed regarding the correct time, dose, and frequency of taking this medication, including desired effects and most common side effects.   Melva Faux 12:51 PM 08/28/2020

## 2020-08-29 ENCOUNTER — Other Ambulatory Visit: Payer: Self-pay

## 2020-08-29 ENCOUNTER — Encounter (HOSPITAL_COMMUNITY)
Admission: RE | Admit: 2020-08-29 | Discharge: 2020-08-29 | Disposition: A | Discharge status: Home / Self Care | Payer: Medicare Other | Source: Ambulatory Visit | Attending: Internal Medicine | Admitting: Internal Medicine

## 2020-08-29 ENCOUNTER — Telehealth: Payer: Self-pay | Admitting: Endocrinology

## 2020-08-29 DIAGNOSIS — I214 Non-ST elevation (NSTEMI) myocardial infarction: Secondary | ICD-10-CM

## 2020-08-29 DIAGNOSIS — Z955 Presence of coronary angioplasty implant and graft: Secondary | ICD-10-CM | POA: Diagnosis not present

## 2020-08-29 NOTE — Progress Notes (Signed)
Incomplete Session Note  Patient Details  Name: [name] MRN: [id number] Date of Birth: 1931/02/03 Referring Provider:     [facility] from 07/17/2020 in [place]  Referring Provider Glori Bickers, MD      [name] did not complete her rehab session. [name] presents to cardiac rehab with a finger stick blood glucose of 380 on her personal glucose meter. She was rechecked with department meter which noted a CBG of 390. [name] is not allowed to participate in her exercise session as this number is above parameters for safe exercise. [name] states she did not take her 70/30 insulin until 11:00 am. She also had a significant amt of carbohydrates for lunch at 1:30 pm including cake, a roll, vege and fruit salad with a "sweet dressing". Patient is asymptomatic. Dr. Loanne Drilling with [facility] notified. Prior to return call from [facility], patient rechecked her blood sugar which was 342. Patient was discharged asymptomatic with CBG trending downward. Patient is encouraged to hydrate with H2O to further lower her blood sugars. Patient verbalized understanding.   Darlen Gledhill E. Rollene Rotunda RN, BSN [place]  Cardiac and Pulmonary Rehabilitation Phone: [phone] Fax: [phone]

## 2020-08-29 NOTE — Telephone Encounter (Signed)
Called pt and left a detailed VM informing her that no changes will be required according to Dr. Cordelia Pen orders below. Advised she call if she had any questions or concerns.

## 2020-08-29 NOTE — Telephone Encounter (Signed)
Nurse from Cardiac Rehab called stating patient is there now with them and her sugar is 390 - nurse asked to please be called back as soon as possible since patient is there now. Truddie Crumble [phone]

## 2020-08-29 NOTE — Telephone Encounter (Signed)
Given pt's advanced age and QD insulin this is the best control this pt should aim for.  Please continue the same medications.

## 2020-08-29 NOTE — Telephone Encounter (Signed)
Please advise how you wish to address the elevated CBG while at Cardiac Rehab?

## 2020-09-02 LAB — GLUCOSE, CAPILLARY: Glucose-Capillary: 390 mg/dL — ABNORMAL HIGH (ref 70–99)

## 2020-09-03 ENCOUNTER — Other Ambulatory Visit: Payer: Self-pay

## 2020-09-03 ENCOUNTER — Encounter (HOSPITAL_COMMUNITY)
Admission: RE | Admit: 2020-09-03 | Discharge: 2020-09-03 | Disposition: A | Discharge status: Home / Self Care | Payer: Medicare Other | Source: Ambulatory Visit | Attending: Internal Medicine | Admitting: Internal Medicine

## 2020-09-03 DIAGNOSIS — I214 Non-ST elevation (NSTEMI) myocardial infarction: Secondary | ICD-10-CM

## 2020-09-03 DIAGNOSIS — Z955 Presence of coronary angioplasty implant and graft: Secondary | ICD-10-CM | POA: Diagnosis not present

## 2020-09-03 LAB — GLUCOSE, CAPILLARY
Glucose-Capillary: 116 mg/dL — ABNORMAL HIGH (ref 70–99)
Glucose-Capillary: 61 mg/dL — ABNORMAL LOW (ref 70–99)
Glucose-Capillary: 95 mg/dL (ref 70–99)

## 2020-09-03 NOTE — Progress Notes (Signed)
Patient reported after she had exercised and taken her monitor off that she feels shaky and thinks that her blood sugar has dropped. CBG 61. Patient given glucose gel per hypoglycemic protocol. Patient was given glucose gel and ginger ale.Claudia reported feeling better after a few minutes. Pre exercise CBG 118. Patient reported that she ate lunch but did not have bread or dessert today. Recheck CBG.95. Patient left cardiac rehab without further symptoms and was taken to her ride to her retirement community.Barnet Pall, RN,BSN 09/03/2020 4:53 PM

## 2020-09-04 NOTE — Progress Notes (Signed)
Cardiac Individual Treatment Plan  Patient Details  Name: ANJANA CHEEK MRN: [id number] Date of Birth: 1931-09-12 Referring Provider:     [facility] from 07/17/2020 in [facility]  Referring Provider Glori Bickers, MD      Initial Encounter Date:    CARDIAC REHAB PHASE II ORIENTATION from 07/17/2020 in [facility]  Date 07/17/20      Visit Diagnosis: NSTEMI (non-ST elevated myocardial infarction) ([place]) 05/16/20  S/P DES, PCI LCX, LAD 05/16/20  Patient's Home Medications on Admission:  Current Outpatient Medications:    acetaminophen (TYLENOL) 500 MG tablet, Take 1,000 mg by mouth every 6 (six) hours as needed (pain)., Disp: , Rfl:    amLODipine (NORVASC) 5 MG tablet, Take 1 tablet (5 mg total) by mouth daily., Disp: 30 tablet, Rfl: 6   apixaban (ELIQUIS) 5 MG TABS tablet, Take 5 mg by mouth 2 (two) times daily., Disp: , Rfl:    CALCIUM-MAG-VIT C-VIT D PO, Take 1 tablet by mouth daily. , Disp: , Rfl:    carvedilol (COREG) 6.25 MG tablet, Take 6.25 mg by mouth 2 (two) times daily with a meal., Disp: , Rfl:    cholecalciferol (VITAMIN D) 1000 units tablet, Take 1,000 Units by mouth daily after supper., Disp: , Rfl:    clopidogrel (PLAVIX) 75 MG tablet, Take 1 tablet (75 mg total) by mouth daily with breakfast., Disp: 90 tablet, Rfl: 2   empagliflozin (JARDIANCE) 10 MG TABS tablet, Take 10 mg by mouth daily before breakfast., Disp: 90 tablet, Rfl: 3   Evolocumab (REPATHA SURECLICK) 025 MG/ML SOAJ, Inject 1 Dose into the skin every 14 (fourteen) days., Disp: 2 pen, Rfl: 11   ezetimibe (ZETIA) 10 MG tablet, Take 1 tablet (10 mg total) by mouth daily., Disp: 30 tablet, Rfl: 10   furosemide (LASIX) 80 MG tablet, TAKE 1 TABLET BY MOUTH TWO  TIMES DAILY, Disp: 180 tablet, Rfl: 3   glucose blood (ONETOUCH VERIO) test strip, Use to monitor glucose levels 2 times daily, Disp: 200 each, Rfl:  5   insulin NPH-regular Human (NOVOLIN 70/30 RELION) (70-30) 100 UNIT/ML injection, Inject 31 Units into the skin daily with breakfast. And syringes 1/day, Disp: 10 mL, Rfl: 11   Lancets (ONETOUCH DELICA PLUS [id number]) MISC, USE TO MONITOR GLUCOSE  LEVELS TWICE DAILY, Disp: 200 each, Rfl: 3   Multiple Vitamin (MULTIVITAMIN WITH MINERALS) TABS tablet, Take 1 tablet by mouth daily. Centrum Silver, Disp: , Rfl:    nitroGLYCERIN (NITROSTAT) 0.4 MG SL tablet, Place 1 tablet (0.4 mg total) under the tongue every 5 (five) minutes as needed for chest pain. Reported on 01/28/2016, Disp: 30 tablet, Rfl: 3   potassium chloride SA (KLOR-CON) 20 MEQ tablet, Take 20 mEq by mouth 2 (two) times daily., Disp: , Rfl:   Past Medical History: Past Medical History:  Diagnosis Date   Anemia    iron defficiency   Anginal pain ([place])    Arthritis    "knees, feet, hands; joints" (05/27/2015)   Asthma    Atrial fibrillation or flutter    s/p RFCA 7/08;   s/p DCCV in past;   previously on amiodarone;  amio stopped due to lung toxicity   CAD (coronary artery disease)    s/p NSTEMI tx with BMS to OM1 3/08;  cath 3/08: pOM 99% tx with PCI, pLAD 20%, ? mod stenosis at the AM   CAP (community acquired pneumonia) 05/24/2015   Chronic diastolic  heart failure (HCC)    echo 11/11:  EF 55-60%, severe LVH, mod LAE, mild MR, mildly increased PASP   COPD (chronic obstructive pulmonary disease) (HCC)    Degenerative joint disease    Dystrophy, corneal stromal    Heart murmur    HLD (hyperlipidemia)    HTN (hypertension)    essential nos   Hypopotassemia    PMH of   Muscle pain    Myocardial infarction ([place]) 2008   OSA on CPAP    Osteoporosis    Pneumonia 05/27/2015   Protein calorie malnutrition (HCC)    Rash and nonspecific skin eruption    both arms,awaiting bio   dr Delman Cheadle   Seasonal allergies    Shortness of breath dyspnea    Type II diabetes mellitus ([place])    Dr Loanne Drilling    Tobacco  Use: Social History   Tobacco Use  Smoking Status Former Smoker   Packs/day: 0.25   Years: 18.00   Pack years: 4.50   Types: Cigarettes   Quit date: 12/27/1968   Years since quitting: 51.7  Smokeless Tobacco Never Used  Tobacco Comment   smoked [place], up to 1 pp week    Labs: Recent Review Flowsheet Data    Labs for ITP Cardiac and Pulmonary Rehab Latest Ref Rng & Units 05/16/2020 05/16/2020 05/16/2020 05/16/2020 08/21/2020   Cholestrol 0 - 200 mg/dL 164 - - - -   LDLCALC 0 - 99 mg/dL 96 - - - -   LDLDIRECT mg/dL - - - - -   HDL >40 mg/dL 50 - - - -   Trlycerides <150 mg/dL 90 - - - -   Hemoglobin A1c 4.0 - 5.6 % - - - - 7.8(A)   PHART 7.35 - 7.45 - 7.348(L) - - -   PCO2ART 32 - 48 mmHg - 57.8(H) - - -   HCO3 20.0 - 28.0 mmol/L - 31.8(H) 24.1 31.2(H) -   TCO2 22 - 32 mmol/L - 34(H) 26 33(H) -   ACIDBASEDEF 0.0 - 2.0 mmol/L - - 2.0 - -   O2SAT % - 99.0 73.0 67.0 -      Capillary Blood Glucose: Lab Results  Component Value Date   GLUCAP 95 09/03/2020   GLUCAP 61 (L) 09/03/2020   GLUCAP 116 (H) 09/03/2020   GLUCAP 390 (H) 08/29/2020   GLUCAP 129 (H) 08/22/2020     Exercise Target Goals: Exercise Program Goal: Individual exercise prescription set using results from initial 6 min walk test and THRR while considering  patients activity barriers and safety.   Exercise Prescription Goal: Starting with aerobic activity 30 plus minutes a day, 3 days per week for initial exercise prescription. Provide home exercise prescription and guidelines that participant acknowledges understanding prior to discharge.  Activity Barriers & Risk Stratification:  Activity Barriers & Cardiac Risk Stratification - 07/17/20 1145      Activity Barriers & Cardiac Risk Stratification   Activity Barriers Back Problems;Right Knee Replacement;Joint Problems;Deconditioning;Shortness of Breath;Balance Concerns;Assistive Device;Other (comment)    Comments PAD, bilateral foot neuropathy, COPD      Cardiac Risk Stratification High           6 Minute Walk:  6 Minute Walk    Row Name 07/17/20 1131         6 Minute Walk   Phase Initial     Distance 600 feet     Walk Time 6 minutes     # of Rest Breaks 2  2  breaks: 2:20-3:33 and 4:55-6:00     MPH 1.13     METS 0.55     RPE 13     Perceived Dyspnea  2     VO2 Peak 1.93     Symptoms Yes (comment)     Comments Right calf pain/burning # 6 (on 0-10 scale), right knee pain, fatigue, SOB (RPD =2)     Resting HR 70 bpm     Resting BP 130/62     Resting Oxygen Saturation  93 %     Exercise Oxygen Saturation  during 6 min walk 92 %     Max Ex. HR 87 bpm     Max Ex. BP 142/70     2 Minute Post BP 130/72       Interval Oxygen   Interval Oxygen? Yes  Pt with h/o COPD, interval SaO2 obtained.     Baseline Oxygen Saturation % 93 %     1 Minute Oxygen Saturation % 93 %     1 Minute Liters of Oxygen 0 L     2 Minute Oxygen Saturation % 92 %     2 Minute Liters of Oxygen 0 L     3 Minute Oxygen Saturation % 94 %     3 Minute Liters of Oxygen 0 L     4 Minute Oxygen Saturation % 92 %     4 Minute Liters of Oxygen 0 L     5 Minute Oxygen Saturation % 93 %     5 Minute Liters of Oxygen 0 L     6 Minute Oxygen Saturation % 93 %     6 Minute Liters of Oxygen 0 L     2 Minute Post Oxygen Saturation % 94 %     2 Minute Post Liters of Oxygen 0 L            Oxygen Initial Assessment:   Oxygen Re-Evaluation:   Oxygen Discharge (Final Oxygen Re-Evaluation):   Initial Exercise Prescription:  Initial Exercise Prescription - 07/17/20 1100      Date of Initial Exercise RX and Referring Provider   Date 07/17/20    Referring Provider Glori Bickers, MD    Expected Discharge Date 09/12/20      NuStep   Level 1    SPM 60    Minutes 25    METs 1.5      Prescription Details   Frequency (times per week) 3    Duration Progress to 30 minutes of continuous aerobic without signs/symptoms of physical distress       Intensity   THRR 40-80% of Max Heartrate 54-107    Ratings of Perceived Exertion 11-13    Perceived Dyspnea 0-4      Progression   Progression Continue progressive overload as per policy without signs/symptoms or physical distress.      Resistance Training   Training Prescription Yes    Weight 1    Reps 10-15           Perform Capillary Blood Glucose checks as needed.  Exercise Prescription Changes:   Exercise Prescription Changes    Row Name 07/23/20 1615 08/06/20 1600 08/27/20 1625         Response to Exercise   Blood Pressure (Admit) 120/80 128/55 148/60     Blood Pressure (Exercise) 152/70 128/78 124/80     Blood Pressure (Exit) 138/70 128/78 138/70     Heart Rate (Admit) 90 bpm 82 bpm 79 bpm  Heart Rate (Exercise) 97 bpm 88 bpm 90 bpm     Heart Rate (Exit) 88 bpm 71 bpm 73 bpm     Oxygen Saturation (Exercise) 91 % 91 % 90 %     Rating of Perceived Exertion (Exercise) 13 11 --     Perceived Dyspnea (Exercise) 3 0 --     Symptoms Pt experienced SOB and took a total of 5 breaks due to SOB. RPD = 3 None None     Comments First day of exericse. Very deconditioned and experiienced SOB and took 5 breaks.  Reviwed METs Reviewed METs and Goals     Duration Progress to 30 minutes of  aerobic without signs/symptoms of physical distress Progress to 30 minutes of  aerobic without signs/symptoms of physical distress Progress to 30 minutes of  aerobic without signs/symptoms of physical distress     Intensity THRR unchanged THRR unchanged THRR unchanged       Progression   Progression Continue to progress workloads to maintain intensity without signs/symptoms of physical distress. Continue to progress workloads to maintain intensity without signs/symptoms of physical distress. Continue to progress workloads to maintain intensity without signs/symptoms of physical distress.     Average METs 1.6 1.5 1.8       Resistance Training   Training Prescription No No No     Weight --   Weights not done on Wednesdays. Will use 1 lb M-F --  Uses 1 lb wts on M and F --  Free weights not done on Wednesday. uses 2 lbs M, F       Interval Training   Interval Training No No No       NuStep   Level 1 1 2      SPM 70 75 75     Minutes 25 38 30     METs 1.6 1.5 1.8       Home Exercise Plan   Plans to continue exercise at -- -- Home (comment)     Frequency -- -- Add 2 additional days to program exercise sessions.     Initial Home Exercises Provided -- -- 08/13/20            Exercise Comments:   Exercise Comments    Row Name 07/23/20 1622 08/06/20 1600 08/13/20 1600 08/27/20 1615     Exercise Comments Pt's first day of exercise. Pt is very decondioned and strugled with level 1 on nustep taking 5 rest breaks. SaO2 = 91 %, RPD =3. Stressed SPM to be no more than 70, pt seemed to be able to tolerate the exercise bout better when she maintained at that level. Pt is progressing well in the CRP2 program. She is engaged, has good attendence and displays an enthusiastic attitude about the CRP2 program. She now tolerates 38 minutes on the nustep with no c/o SOB and does not require any breaks. She voiced that she still gets SOB and fatigued when walking with her cane. She reveals, when questioned, that she has much better exercise tolerance when using her rolling walker. She voices being able to walk further with less severe fatigue and SOB. Pt encouraged to utilze her rolling walker more so that she can experince better exercise tolerance. She agrees to try this. Reviewed home exercise Rx with patient and provided copy. Pt will walk at her ALF 2x/week for 30 minutes using her rolling walker for imrpoved exercise experience and to increase endurance. Reviwed Goals and METs. Pt is doing extra walking in her  building, as well as going to the dining room, but still experiences SOB when walking long distances. Continue to encourage shorter exercise bouts for a total of 30 minutes per day.            Exercise Goals and Review:   Exercise Goals    Row Name 07/17/20 1155             Exercise Goals   Increase Physical Activity Yes       Intervention Provide advice, education, support and counseling about physical activity/exercise needs.;Develop an individualized exercise prescription for aerobic and resistive training based on initial evaluation findings, risk stratification, comorbidities and participant's personal goals.       Expected Outcomes Short Term: Attend rehab on a regular basis to increase amount of physical activity.;Long Term: Add in home exercise to make exercise part of routine and to increase amount of physical activity.;Long Term: Exercising regularly at least 3-5 days a week.       Increase Strength and Stamina Yes       Intervention Provide advice, education, support and counseling about physical activity/exercise needs.;Develop an individualized exercise prescription for aerobic and resistive training based on initial evaluation findings, risk stratification, comorbidities and participant's personal goals.       Expected Outcomes Short Term: Increase workloads from initial exercise prescription for resistance, speed, and METs.;Short Term: Perform resistance training exercises routinely during rehab and add in resistance training at home;Long Term: Improve cardiorespiratory fitness, muscular endurance and strength as measured by increased METs and functional capacity (6MWT)       Able to understand and use rate of perceived exertion (RPE) scale Yes       Intervention Provide education and explanation on how to use RPE scale       Expected Outcomes Short Term: Able to use RPE daily in rehab to express subjective intensity level;Long Term:  Able to use RPE to guide intensity level when exercising independently       Knowledge and understanding of Target Heart Rate Range (THRR) Yes       Intervention Provide education and explanation of THRR including how the numbers were  predicted and where they are located for reference       Expected Outcomes Short Term: Able to state/look up THRR;Long Term: Able to use THRR to govern intensity when exercising independently;Short Term: Able to use daily as guideline for intensity in rehab       Able to check pulse independently Yes       Intervention Provide education and demonstration on how to check pulse in carotid and radial arteries.;Review the importance of being able to check your own pulse for safety during independent exercise       Expected Outcomes Short Term: Able to explain why pulse checking is important during independent exercise;Long Term: Able to check pulse independently and accurately       Understanding of Exercise Prescription Yes       Intervention Provide education, explanation, and written materials on patient's individual exercise prescription       Expected Outcomes Short Term: Able to explain program exercise prescription;Long Term: Able to explain home exercise prescription to exercise independently              Exercise Goals Re-Evaluation :  Exercise Goals Re-Evaluation    Row Name 07/23/20 1620 08/13/20 1535 08/27/20 1610         Exercise Goal Re-Evaluation   Exercise Goals Review Increase Physical Activity;Increase Strength  and Stamina;Able to understand and use rate of perceived exertion (RPE) scale;Knowledge and understanding of Target Heart Rate Range (THRR);Able to check pulse independently;Understanding of Exercise Prescription;Able to understand and use Dyspnea scale Increase Physical Activity;Increase Strength and Stamina;Able to understand and use rate of perceived exertion (RPE) scale;Knowledge and understanding of Target Heart Rate Range (THRR);Understanding of Exercise Prescription Increase Physical Activity;Increase Strength and Stamina;Able to understand and use rate of perceived exertion (RPE) scale;Knowledge and understanding of Target Heart Rate Range (THRR);Understanding of  Exercise Prescription     Comments Pt's first day of exerise in the CRP2 program. Pt struggled with level 1 on nustep experencing SOB with an RPD = 3 and SaO2 @ 91%. Pt need to take 5 rest breaks to drink water and regain her breath. Instucted pt to not exceed 70 steps per minute on nustep. Once pt did this she was able to increase the amount of time between breaks. She understands the exercise prescription, THRR, the RPE and RPD scales. Reviewed Home exercise program with patient. Pt is making progress in the CRP2 program. Encouraged patient to begin walking at the ALF where she lives. We dicussed brekaing-up her exericse bouts to 3x/day for 10 minutes (2x/week). Pt will walk in the hallway outside her room to the elevator and back for 10 minutes using her rolling walker. Pt verbalized understanding of the home exercise plan and was provied a copy. Pt has been walking with the rolling walker the building where she lives. She goes to the dining room 2x/day and she also does extra walking in the hallway outside her room to the the elevator and back. She continues to c/o of getting SOB with walking long distances. Continue to encourage shorter walks with breaks more frequently to increase stamina.     Expected Outcomes Will continue to monitor and progress patinet as tolerated. Pt will walk 2x/week for a total of 30 minutes. Pt will continue to walk in her building for a total of 30 minutes per day.             Discharge Exercise Prescription (Final Exercise Prescription Changes):  Exercise Prescription Changes - 08/27/20 1625      Response to Exercise   Blood Pressure (Admit) 148/60    Blood Pressure (Exercise) 124/80    Blood Pressure (Exit) 138/70    Heart Rate (Admit) 79 bpm    Heart Rate (Exercise) 90 bpm    Heart Rate (Exit) 73 bpm    Oxygen Saturation (Exercise) 90 %    Symptoms None    Comments Reviewed METs and Goals    Duration Progress to 30 minutes of  aerobic without signs/symptoms  of physical distress    Intensity THRR unchanged      Progression   Progression Continue to progress workloads to maintain intensity without signs/symptoms of physical distress.    Average METs 1.8      Resistance Training   Training Prescription No    Weight --   Free weights not done on Wednesday. uses 2 lbs M, F     Interval Training   Interval Training No      NuStep   Level 2    SPM 75    Minutes 30    METs 1.8      Home Exercise Plan   Plans to continue exercise at Home (comment)    Frequency Add 2 additional days to program exercise sessions.    Initial Home Exercises Provided 08/13/20  Nutrition:  Target Goals: Understanding of nutrition guidelines, daily intake of sodium 1500mg , cholesterol 200mg , calories 30% from fat and 7% or less from saturated fats, daily to have 5 or more servings of fruits and vegetables.  Biometrics:  Pre Biometrics - 07/17/20 0929      Pre Biometrics   Waist Circumference 40 inches    Hip Circumference 46.5 inches    Waist to Hip Ratio 0.86 %    Triceps Skinfold 25 mm    % Body Fat 42.9 %    Grip Strength 17 kg    Flexibility 14.5 in    Single Leg Stand 0 seconds   Not done: Pt uses walker and cane for mobility           Nutrition Therapy Plan and Nutrition Goals:  Nutrition Therapy & Goals - 09/04/20 0715      Nutrition Therapy   Diet Heart Healthy      Personal Nutrition Goals   Nutrition Goal Pt to identify food quantities necessary to achieve weight loss of 6-24 lb at graduation from cardiac rehab.    Personal Goal #2 CBG concentrations in the normal range or as close to normal as is safely possible.    Personal Goal #3 Improved blood glucose control as evidenced by pt's A1c trending from 8.3 toward less than 7.0.      Intervention Plan   Intervention Prescribe, educate and counsel regarding individualized specific dietary modifications aiming towards targeted core components such as weight, hypertension,  lipid management, diabetes, heart failure and other comorbidities.;Nutrition handout(s) given to patient.    Expected Outcomes Short Term Goal: A plan has been developed with personal nutrition goals set during dietitian appointment.;Long Term Goal: Adherence to prescribed nutrition plan.           Nutrition Assessments:   Nutrition Goals Re-Evaluation:  Nutrition Goals Re-Evaluation    [place] Name 09/04/20 0718             Goals   Current Weight 167 lb (75.8 kg)       Nutrition Goal Pt to identify food quantities necessary to achieve weight loss of 6-24 lb at graduation from cardiac rehab.         Personal Goal #2 Re-Evaluation   Personal Goal #2 CBG concentrations in the normal range or as close to normal as is safely possible.         Personal Goal #3 Re-Evaluation   Personal Goal #3 Improved blood glucose control as evidenced by pt's A1c trending from 8.3 toward less than 7.0.              Nutrition Goals Discharge (Final Nutrition Goals Re-Evaluation):  Nutrition Goals Re-Evaluation - 09/04/20 0718      Goals   Current Weight 167 lb (75.8 kg)    Nutrition Goal Pt to identify food quantities necessary to achieve weight loss of 6-24 lb at graduation from cardiac rehab.      Personal Goal #2 Re-Evaluation   Personal Goal #2 CBG concentrations in the normal range or as close to normal as is safely possible.      Personal Goal #3 Re-Evaluation   Personal Goal #3 Improved blood glucose control as evidenced by pt's A1c trending from 8.3 toward less than 7.0.           Psychosocial: Target Goals: Acknowledge presence or absence of significant depression and/or stress, maximize coping skills, provide positive support system. Participant is able to verbalize types and ability to  use techniques and skills needed for reducing stress and depression.  Initial Review & Psychosocial Screening:  Initial Psych Review & Screening - 07/17/20 1038      Initial Review   Current  issues with Current Sleep Concerns;Current Stress Concerns    Source of Stress Concerns Family    Comments Hassan Buckler reports that she gets up at 3am and 5 am every morning and falls back to sleep. Libbey's son has throat cancer and was recently discharged from the hospital after a lengthy admission.      Family Dynamics   Good Support System? Yes      Barriers   Psychosocial barriers to participate in program The patient should benefit from training in stress management and relaxation.      Screening Interventions   Interventions Encouraged to exercise           Quality of Life Scores:  Quality of Life - 07/17/20 1124      Quality of Life   Select Quality of Life      Quality of Life Scores   Health/Function Pre 24 %    Socioeconomic Pre 30 %    Psych/Spiritual Pre 30 %    Family Pre 30 %    GLOBAL Pre 27.29 %          Scores of 19 and below usually indicate a poorer quality of life in these areas.  A difference of  2-3 points is a clinically meaningful difference.  A difference of 2-3 points in the total score of the Quality of Life Index has been associated with significant improvement in overall quality of life, self-image, physical symptoms, and general health in studies assessing change in quality of life.  PHQ-9: Recent Review Flowsheet Data    Depression screen [facility] 2/9 07/17/2020 10/12/2019 08/25/2018 07/31/2018 12/12/2017   Decreased Interest 0 0 0 0 0   Down, Depressed, Hopeless 0 1 0 0 0   PHQ - 2 Score 0 1 0 0 0     Interpretation of Total Score  Total Score Depression Severity:  1-4 = Minimal depression, 5-9 = Mild depression, 10-14 = Moderate depression, 15-19 = Moderately severe depression, 20-27 = Severe depression   Psychosocial Evaluation and Intervention:   Psychosocial Re-Evaluation:  Psychosocial Re-Evaluation    [place] Name 08/07/20 0845 09/04/20 1547           Psychosocial Re-Evaluation   Current issues with Current Stress Concerns Current  Stress Concerns      Comments Diasha's says her son has a lengthy recovery at home whom she is still concerned about Shriley's says her son continues to slowly recover      Expected Outcomes Will continue to offer support as needed Will continue to offer support as needed      Interventions Encouraged to attend Cardiac Rehabilitation for the exercise;Stress management education Encouraged to attend Cardiac Rehabilitation for the exercise;Stress management education      Continue Psychosocial Services  Follow up required by staff No Follow up required      Comments Rondell has not voiced any concerns about insomnia recently Delinda has not voiced any concerns about insomnia recently        Initial Review   Source of Stress Concerns Family Family             Psychosocial Discharge (Final Psychosocial Re-Evaluation):  Psychosocial Re-Evaluation - 09/04/20 1547      Psychosocial Re-Evaluation   Current issues with Current Stress Concerns  Comments Shriley's says her son continues to slowly recover    Expected Outcomes Will continue to offer support as needed    Interventions Encouraged to attend Cardiac Rehabilitation for the exercise;Stress management education    Continue Psychosocial Services  No Follow up required    Comments Dorathea has not voiced any concerns about insomnia recently      Initial Review   Source of Stress Concerns Family           Vocational Rehabilitation: Provide vocational rehab assistance to qualifying candidates.   Vocational Rehab Evaluation & Intervention:  Vocational Rehab - 07/17/20 1225      Initial Vocational Rehab Evaluation & Intervention   Assessment shows need for Vocational Rehabilitation No   Starkisha is retired and does not need vocational rehab at this time          Education: Education Goals: Education classes will be provided on a weekly basis, covering required topics. Participant will state understanding/return demonstration of  topics presented.  Learning Barriers/Preferences:  Learning Barriers/Preferences - 07/17/20 1125      Learning Barriers/Preferences   Learning Barriers Sight   waers glasses   Learning Preferences Pictoral;Video;Computer/Internet           Education Topics: Hypertension, Hypertension Reduction -Define heart disease and high blood pressure. Discus how high blood pressure affects the body and ways to reduce high blood pressure.   Exercise and Your Heart -Discuss why it is important to exercise, the FITT principles of exercise, normal and abnormal responses to exercise, and how to exercise safely.   Angina -Discuss definition of angina, causes of angina, treatment of angina, and how to decrease risk of having angina.   Cardiac Medications -Review what the following cardiac medications are used for, how they affect the body, and side effects that may occur when taking the medications.  Medications include Aspirin, Beta blockers, calcium channel blockers, ACE Inhibitors, angiotensin receptor blockers, diuretics, digoxin, and antihyperlipidemics.   Congestive Heart Failure -Discuss the definition of CHF, how to live with CHF, the signs and symptoms of CHF, and how keep track of weight and sodium intake.   Heart Disease and Intimacy -Discus the effect sexual activity has on the heart, how changes occur during intimacy as we age, and safety during sexual activity.   Smoking Cessation / COPD -Discuss different methods to quit smoking, the health benefits of quitting smoking, and the definition of COPD.   Nutrition I: Fats -Discuss the types of cholesterol, what cholesterol does to the heart, and how cholesterol levels can be controlled.   Nutrition II: Labels -Discuss the different components of food labels and how to read food label   Heart Parts/Heart Disease and PAD -Discuss the anatomy of the heart, the pathway of blood circulation through the heart, and these are  affected by heart disease.   Stress I: Signs and Symptoms -Discuss the causes of stress, how stress may lead to anxiety and depression, and ways to limit stress.   Stress II: Relaxation -Discuss different types of relaxation techniques to limit stress.   Warning Signs of Stroke / TIA -Discuss definition of a stroke, what the signs and symptoms are of a stroke, and how to identify when someone is having stroke.   Knowledge Questionnaire Score:  Knowledge Questionnaire Score - 07/17/20 1125      Knowledge Questionnaire Score   Pre Score 17/24           Core Components/Risk Factors/Patient Goals at Admission:  Personal Goals  and Risk Factors at Admission - 07/17/20 1226      Core Components/Risk Factors/Patient Goals on Admission    Weight Management Yes;Obesity;Weight Loss    Intervention Weight Management: Develop a combined nutrition and exercise program designed to reach desired caloric intake, while maintaining appropriate intake of nutrient and fiber, sodium and fats, and appropriate energy expenditure required for the weight goal.;Weight Management: Provide education and appropriate resources to help participant work on and attain dietary goals.;Weight Management/Obesity: Establish reasonable short term and long term weight goals.;Obesity: Provide education and appropriate resources to help participant work on and attain dietary goals.    Admit Weight 168 lb 14 oz (76.6 kg)    Goal Weight: Long Term 153 lb (69.4 kg)    Expected Outcomes Short Term: Continue to assess and modify interventions until short term weight is achieved;Long Term: Adherence to nutrition and physical activity/exercise program aimed toward attainment of established weight goal;Weight Maintenance: Understanding of the daily nutrition guidelines, which includes 25-35% calories from fat, 7% or less cal from saturated fats, less than 200mg  cholesterol, less than 1.5gm of sodium, & 5 or more servings of fruits  and vegetables daily;Weight Loss: Understanding of general recommendations for a balanced deficit meal plan, which promotes 1-2 lb weight loss per week and includes a negative energy balance of [id number] kcal/d;Understanding recommendations for meals to include 15-35% energy as protein, 25-35% energy from fat, 35-60% energy from carbohydrates, less than 200mg  of dietary cholesterol, 20-35 gm of total fiber daily;Understanding of distribution of calorie intake throughout the day with the consumption of 4-5 meals/snacks    Diabetes Yes    Intervention Provide education about signs/symptoms and action to take for hypo/hyperglycemia.;Provide education about proper nutrition, including hydration, and aerobic/resistive exercise prescription along with prescribed medications to achieve blood glucose in normal ranges: Fasting glucose 65-99 mg/dL    Expected Outcomes Short Term: Participant verbalizes understanding of the signs/symptoms and immediate care of hyper/hypoglycemia, proper foot care and importance of medication, aerobic/resistive exercise and nutrition plan for blood glucose control.;Long Term: Attainment of HbA1C < 7%.    Heart Failure Yes    Intervention Provide a combined exercise and nutrition program that is supplemented with education, support and counseling about heart failure. Directed toward relieving symptoms such as shortness of breath, decreased exercise tolerance, and extremity edema.    Expected Outcomes Short term: Attendance in program 2-3 days a week with increased exercise capacity. Reported lower sodium intake. Reported increased fruit and vegetable intake. Reports medication compliance.;Short term: Daily weights obtained and reported for increase. Utilizing diuretic protocols set by physician.;Long term: Adoption of self-care skills and reduction of barriers for early signs and symptoms recognition and intervention leading to self-care maintenance.    Hypertension Yes    Intervention  Provide education on lifestyle modifcations including regular physical activity/exercise, weight management, moderate sodium restriction and increased consumption of fresh fruit, vegetables, and low fat dairy, alcohol moderation, and smoking cessation.;Monitor prescription use compliance.    Expected Outcomes Short Term: Continued assessment and intervention until BP is < 140/12mm HG in hypertensive participants. < 130/77mm HG in hypertensive participants with diabetes, heart failure or chronic kidney disease.;Long Term: Maintenance of blood pressure at goal levels.    Lipids Yes    Intervention Provide education and support for participant on nutrition & aerobic/resistive exercise along with prescribed medications to achieve LDL 70mg , HDL >40mg .    Expected Outcomes Short Term: Participant states understanding of desired cholesterol values and is compliant with medications prescribed. Participant  is following exercise prescription and nutrition guidelines.;Long Term: Cholesterol controlled with medications as prescribed, with individualized exercise RX and with personalized nutrition plan. Value goals: LDL < 70mg , HDL > 40 mg.    Stress Yes    Intervention Offer individual and/or small group education and counseling on adjustment to heart disease, stress management and health-related lifestyle change. Teach and support self-help strategies.    Expected Outcomes Short Term: Participant demonstrates changes in health-related behavior, relaxation and other stress management skills, ability to obtain effective social support, and compliance with psychotropic medications if prescribed.;Long Term: Emotional wellbeing is indicated by absence of clinically significant psychosocial distress or social isolation.           Core Components/Risk Factors/Patient Goals Review:   Goals and Risk Factor Review    Row Name 08/07/20 0847 09/04/20 1550           Core Components/Risk Factors/Patient Goals Review    Personal Goals Review Weight Management/Obesity;Lipids;Diabetes;Stress;Heart Failure;Hypertension Weight Management/Obesity;Lipids;Diabetes;Stress;Heart Failure;Hypertension      Review Genae's vital signs have been stable. CBG's have varied. Oxygen saturation's have been between 90-94%. Marin is enjoying participating in phase 2 and says she feels strnger Amahia's vital signs have been stable. CBG's have varied. Oxygen saturation's have been between 90-94%. Deari is enjoying participating in phase 2 and says she feels strnger      Expected Outcomes Siddalee will continue to participate in phase 2 cardiac rehab for exercise, nutrition and lifestyle modifications Dhrithi will continue to participate in phase 2 cardiac rehab for exercise, nutrition and lifestyle modifications. Roneshia completes cardiac rehab next Friday             Core Components/Risk Factors/Patient Goals at Discharge (Final Review):   Goals and Risk Factor Review - 09/04/20 1550      Core Components/Risk Factors/Patient Goals Review   Personal Goals Review Weight Management/Obesity;Lipids;Diabetes;Stress;Heart Failure;Hypertension    Review Mikhaila's vital signs have been stable. CBG's have varied. Oxygen saturation's have been between 90-94%. Lonna is enjoying participating in phase 2 and says she feels strnger    Expected Outcomes Korri will continue to participate in phase 2 cardiac rehab for exercise, nutrition and lifestyle modifications. Sheresa completes cardiac rehab next Friday           ITP Comments:  ITP Comments    Row Name 07/17/20 1638 08/07/20 0843 09/04/20 1546       ITP Comments Dr Fransico Him MD, Medical Director 30 Day ITP Review. Patient with good attendance and participation in phase 2 cardiac rehab. 30 Day ITP Review. Patient with good attendance and participation in phase 2 cardiac rehab. When transportation is available            Comments: See ITP comments.Barnet Pall,  RN,BSN 09/04/2020 3:53 PM

## 2020-09-05 ENCOUNTER — Encounter (HOSPITAL_COMMUNITY)
Admission: RE | Admit: 2020-09-05 | Discharge: 2020-09-05 | Disposition: A | Discharge status: Home / Self Care | Payer: Medicare Other | Source: Ambulatory Visit | Attending: Internal Medicine | Admitting: Internal Medicine

## 2020-09-05 ENCOUNTER — Other Ambulatory Visit: Payer: Self-pay

## 2020-09-05 DIAGNOSIS — I214 Non-ST elevation (NSTEMI) myocardial infarction: Secondary | ICD-10-CM | POA: Diagnosis not present

## 2020-09-05 DIAGNOSIS — Z955 Presence of coronary angioplasty implant and graft: Secondary | ICD-10-CM

## 2020-09-08 ENCOUNTER — Other Ambulatory Visit: Payer: Self-pay

## 2020-09-08 ENCOUNTER — Encounter (HOSPITAL_COMMUNITY)
Admission: RE | Admit: 2020-09-08 | Discharge: 2020-09-08 | Disposition: A | Discharge status: Home / Self Care | Payer: Medicare Other | Source: Ambulatory Visit | Attending: Internal Medicine | Admitting: Internal Medicine

## 2020-09-08 DIAGNOSIS — I214 Non-ST elevation (NSTEMI) myocardial infarction: Secondary | ICD-10-CM

## 2020-09-08 DIAGNOSIS — Z955 Presence of coronary angioplasty implant and graft: Secondary | ICD-10-CM | POA: Diagnosis not present

## 2020-09-10 ENCOUNTER — Other Ambulatory Visit: Payer: Self-pay

## 2020-09-10 ENCOUNTER — Encounter (HOSPITAL_COMMUNITY)
Admission: RE | Admit: 2020-09-10 | Discharge: 2020-09-10 | Disposition: A | Discharge status: Home / Self Care | Payer: Medicare Other | Source: Ambulatory Visit | Attending: Internal Medicine | Admitting: Internal Medicine

## 2020-09-10 VITALS — Ht 63.0 in | Wt 169.5 lb

## 2020-09-10 DIAGNOSIS — I214 Non-ST elevation (NSTEMI) myocardial infarction: Secondary | ICD-10-CM | POA: Diagnosis not present

## 2020-09-10 DIAGNOSIS — Z955 Presence of coronary angioplasty implant and graft: Secondary | ICD-10-CM | POA: Diagnosis not present

## 2020-09-11 LAB — GLUCOSE, CAPILLARY: Glucose-Capillary: 153 mg/dL — ABNORMAL HIGH (ref 70–99)

## 2020-09-12 ENCOUNTER — Other Ambulatory Visit: Payer: Self-pay

## 2020-09-12 ENCOUNTER — Encounter (HOSPITAL_COMMUNITY)
Admission: RE | Admit: 2020-09-12 | Discharge: 2020-09-12 | Disposition: A | Discharge status: Home / Self Care | Payer: Medicare Other | Source: Ambulatory Visit | Attending: Internal Medicine | Admitting: Internal Medicine

## 2020-09-12 DIAGNOSIS — I214 Non-ST elevation (NSTEMI) myocardial infarction: Secondary | ICD-10-CM | POA: Diagnosis not present

## 2020-09-12 DIAGNOSIS — Z955 Presence of coronary angioplasty implant and graft: Secondary | ICD-10-CM | POA: Diagnosis not present

## 2020-09-12 NOTE — Progress Notes (Signed)
Discharge Progress Report  Patient Details  [name]: [name] MRN: [id number] Date of Birth: 1931-11-03 Referring Provider:     Garner from 07/17/2020 in [place]  Referring Provider Glori Bickers, MD       Number of Visits: 18  Reason for Discharge:  Patient reached a stable level of exercise. Patient has met program and personal goals.  Smoking History:  Social History   Tobacco Use  Smoking Status Former Smoker  . Packs/day: 0.25  . Years: 18.00  . Pack years: 4.50  . Types: Cigarettes  . Quit date: 12/27/1968  . Years since quitting: 51.7  Smokeless Tobacco Never Used  Tobacco Comment   smoked [place], up to 1 pp week    Diagnosis:  NSTEMI (non-ST elevated myocardial infarction) ([place]) 05/16/20  S/P DES, PCI LCX, LAD 05/16/20  ADL UCSD:   Initial Exercise Prescription:  Initial Exercise Prescription - 07/17/20 1100      Date of Initial Exercise RX and Referring Provider   Date 07/17/20    Referring Provider Glori Bickers, MD    Expected Discharge Date 09/12/20      NuStep   Level 1    SPM 60    Minutes 25    METs 1.5      Prescription Details   Frequency (times per week) 3    Duration Progress to 30 minutes of continuous aerobic without signs/symptoms of physical distress      Intensity   THRR 40-80% of Max Heartrate 54-107    Ratings of Perceived Exertion 11-13    Perceived Dyspnea 0-4      Progression   Progression Continue progressive overload as per policy without signs/symptoms or physical distress.      Resistance Training   Training Prescription Yes    Weight 1    Reps 10-15           Discharge Exercise Prescription (Final Exercise Prescription Changes):  Exercise Prescription Changes - 09/12/20 1610      Response to Exercise   Blood Pressure (Admit) 144/70    Blood Pressure (Exercise) 128/72    Blood Pressure (Exit) 124/70    Heart Rate (Admit) 88  bpm    Heart Rate (Exercise) 89 bpm    Heart Rate (Exit) 72 bpm    Rating of Perceived Exertion (Exercise) 11    Perceived Dyspnea (Exercise) 0    Symptoms None    Comments Pt graduated the CRP2 program today.    Duration Continue with 30 min of aerobic exercise without signs/symptoms of physical distress.    Intensity THRR unchanged      Progression   Progression Continue to progress workloads to maintain intensity without signs/symptoms of physical distress.    Average METs 1.8      Resistance Training   Training Prescription Yes    Weight 2 lbs    Reps 10-15    Time 10 Minutes      Interval Training   Interval Training No      NuStep   Level 2    SPM 90    Minutes 30    METs 1.8      Home Exercise Plan   Plans to continue exercise at Home (comment)    Frequency Add 2 additional days to program exercise sessions.    Initial Home Exercises Provided 08/13/20           Functional Capacity:  6 Minute Walk  [name] 07/17/20 1131 09/08/20 1509       6 Minute Walk   Phase Initial Discharge    Distance 600 feet 752 feet    Distance % Change -- 25.33 %    Distance Feet Change -- 152 ft    Walk Time 6 minutes 6 minutes    # of Rest Breaks 2  2 breaks: 2:20-3:33 and 4:55-6:00 1    MPH 1.13 1.42    METS 0.55 0.92    RPE 13 13    Perceived Dyspnea  2 2    VO2 Peak 1.93 3.22    Symptoms Yes (comment) Yes (comment)    Comments Right calf pain/burning # 6 (on 0-10 scale), right knee pain, fatigue, SOB (RPD =2) SOB with RPD=2. Right hip pain (# 6 on 0-10 scale), Biateral knee pain, (#5 on 0-10 scale)    Resting HR 70 bpm 70 bpm    Resting BP 130/62 142/78    Resting Oxygen Saturation  93 % 93 %    Exercise Oxygen Saturation  during 6 min walk 92 % 94 %    Max Ex. HR 87 bpm 96 bpm    Max Ex. BP 142/70 144/70    2 Minute Post BP 130/72 --      Interval Oxygen   Interval Oxygen? Yes  Pt with h/o COPD, interval SaO2 obtained. --    Baseline Oxygen Saturation % 93 %  94 %    1 Minute Oxygen Saturation % 93 % 94 %    1 Minute Liters of Oxygen 0 L 0 L    2 Minute Oxygen Saturation % 92 % 92 %    2 Minute Liters of Oxygen 0 L 0 L    3 Minute Oxygen Saturation % 94 % 92 %    3 Minute Liters of Oxygen 0 L 0 L    4 Minute Oxygen Saturation % 92 % 94 %    4 Minute Liters of Oxygen 0 L 0 L    5 Minute Oxygen Saturation % 93 % 94 %    5 Minute Liters of Oxygen 0 L 0 L    6 Minute Oxygen Saturation % 93 % 91 %    6 Minute Liters of Oxygen 0 L 0 L    2 Minute Post Oxygen Saturation % 94 % 96 %    2 Minute Post Liters of Oxygen 0 L 0 L           Psychological, QOL, Others - Outcomes: PHQ 2/9: Depression screen [facility] 2/9 09/19/2020 07/17/2020 10/12/2019 08/25/2018 07/31/2018  Decreased Interest 0 0 0 0 0  Down, Depressed, Hopeless 0 0 1 0 0  PHQ - 2 Score 0 0 1 0 0  Some recent data might be hidden    Quality of Life:  Quality of Life - 09/05/20 1515      Quality of Life Scores   Health/Function Post 25.33 %    Socioeconomic Post 28.29 %    Psych/Spiritual Post 29.64 %    Family Post 27 %    GLOBAL Post 27.08 %           Personal Goals: Goals established at orientation with interventions provided to work toward goal.  Personal Goals and Risk Factors at Admission - 07/17/20 1226      Core Components/Risk Factors/Patient Goals on Admission    Weight Management Yes;Obesity;Weight Loss    Intervention Weight Management: Develop a combined nutrition and exercise  program designed to reach desired caloric intake, while maintaining appropriate intake of nutrient and fiber, sodium and fats, and appropriate energy expenditure required for the weight goal.;Weight Management: Provide education and appropriate resources to help participant work on and attain dietary goals.;Weight Management/Obesity: Establish reasonable short term and long term weight goals.;Obesity: Provide education and appropriate resources to help participant work on and attain dietary goals.     Admit Weight 168 lb 14 oz (76.6 kg)    Goal Weight: Long Term 153 lb (69.4 kg)    Expected Outcomes Short Term: Continue to assess and modify interventions until short term weight is achieved;Long Term: Adherence to nutrition and physical activity/exercise program aimed toward attainment of established weight goal;Weight Maintenance: Understanding of the daily nutrition guidelines, which includes 25-35% calories from fat, 7% or less cal from saturated fats, less than 238m cholesterol, less than 1.5gm of sodium, & 5 or more servings of fruits and vegetables daily;Weight Loss: Understanding of general recommendations for a balanced deficit meal plan, which promotes 1-2 lb weight loss per week and includes a negative energy balance of [phone] kcal/d;Understanding recommendations for meals to include 15-35% energy as protein, 25-35% energy from fat, 35-60% energy from carbohydrates, less than 2082mof dietary cholesterol, 20-35 gm of total fiber daily;Understanding of distribution of calorie intake throughout the day with the consumption of 4-5 meals/snacks    Diabetes Yes    Intervention Provide education about signs/symptoms and action to take for hypo/hyperglycemia.;Provide education about proper nutrition, including hydration, and aerobic/resistive exercise prescription along with prescribed medications to achieve blood glucose in normal ranges: Fasting glucose 65-99 mg/dL    Expected Outcomes Short Term: Participant verbalizes understanding of the signs/symptoms and immediate care of hyper/hypoglycemia, proper foot care and importance of medication, aerobic/resistive exercise and nutrition plan for blood glucose control.;Long Term: Attainment of HbA1C < 7%.    Heart Failure Yes    Intervention Provide a combined exercise and nutrition program that is supplemented with education, support and counseling about heart failure. Directed toward relieving symptoms such as shortness of breath, decreased  exercise tolerance, and extremity edema.    Expected Outcomes Short term: Attendance in program 2-3 days a week with increased exercise capacity. Reported lower sodium intake. Reported increased fruit and vegetable intake. Reports medication compliance.;Short term: Daily weights obtained and reported for increase. Utilizing diuretic protocols set by physician.;Long term: Adoption of self-care skills and reduction of barriers for early signs and symptoms recognition and intervention leading to self-care maintenance.    Hypertension Yes    Intervention Provide education on lifestyle modifcations including regular physical activity/exercise, weight management, moderate sodium restriction and increased consumption of fresh fruit, vegetables, and low fat dairy, alcohol moderation, and smoking cessation.;Monitor prescription use compliance.    Expected Outcomes Short Term: Continued assessment and intervention until BP is < 140/9038mG in hypertensive participants. < 130/29m77m in hypertensive participants with diabetes, heart failure or chronic kidney disease.;Long Term: Maintenance of blood pressure at goal levels.    Lipids Yes    Intervention Provide education and support for participant on nutrition & aerobic/resistive exercise along with prescribed medications to achieve LDL <70mg35mL >40mg.37mExpected Outcomes Short Term: Participant states understanding of desired cholesterol values and is compliant with medications prescribed. Participant is following exercise prescription and nutrition guidelines.;Long Term: Cholesterol controlled with medications as prescribed, with individualized exercise RX and with personalized nutrition plan. Value goals: LDL < 70mg, 71m> 40 mg.    Stress Yes  Intervention Offer individual and/or small group education and counseling on adjustment to heart disease, stress management and health-related lifestyle change. Teach and support self-help strategies.    Expected  Outcomes Short Term: Participant demonstrates changes in health-related behavior, relaxation and other stress management skills, ability to obtain effective social support, and compliance with psychotropic medications if prescribed.;Long Term: Emotional wellbeing is indicated by absence of clinically significant psychosocial distress or social isolation.            Personal Goals Discharge:  Goals and Risk Factor Review    Row [name] 08/07/20 0847 09/04/20 1550 09/19/20 1349         Core Components/Risk Factors/Patient Goals Review   Personal Goals Review Weight Management/Obesity;Lipids;Diabetes;Stress;Heart Failure;Hypertension Weight Management/Obesity;Lipids;Diabetes;Stress;Heart Failure;Hypertension Weight Management/Obesity;Lipids;Diabetes;Stress;Heart Failure;Hypertension     Review [name]'s vital signs have been stable. CBG's have varied. Oxygen saturation's have been between 90-94%. [name] is enjoying participating in phase 2 and says she feels strnger [name]'s vital signs have been stable. CBG's have varied. Oxygen saturation's have been between 90-94%. [name] is enjoying participating in phase 2 and says she feels strnger [name] completed cardiac rehab on 09/12/20 and did well with exercise for her fitness level and age.     Expected Outcomes [name] will continue to participate in phase 2 cardiac rehab for exercise, nutrition and lifestyle modifications [name] will continue to participate in phase 2 cardiac rehab for exercise, nutrition and lifestyle modifications. [name] completes cardiac rehab next Friday [place] plans to continue exercising at the retirement community on the same exercise equipment.            Exercise Goals and Review:  Exercise Goals    Row [name] 07/17/20 1155             Exercise Goals   Increase Physical Activity Yes       Intervention Provide advice, education, support and counseling about physical activity/exercise needs.;Develop an  individualized exercise prescription for aerobic and resistive training based on initial evaluation findings, risk stratification, comorbidities and participant's personal goals.       Expected Outcomes Short Term: Attend rehab on a regular basis to increase amount of physical activity.;Long Term: Add in home exercise to make exercise part of routine and to increase amount of physical activity.;Long Term: Exercising regularly at least 3-5 days a week.       Increase Strength and Stamina Yes       Intervention Provide advice, education, support and counseling about physical activity/exercise needs.;Develop an individualized exercise prescription for aerobic and resistive training based on initial evaluation findings, risk stratification, comorbidities and participant's personal goals.       Expected Outcomes Short Term: Increase workloads from initial exercise prescription for resistance, speed, and METs.;Short Term: Perform resistance training exercises routinely during rehab and add in resistance training at home;Long Term: Improve cardiorespiratory fitness, muscular endurance and strength as measured by increased METs and functional capacity (6MWT)       Able to understand and use rate of perceived exertion (RPE) scale Yes       Intervention Provide education and explanation on how to use RPE scale       Expected Outcomes Short Term: Able to use RPE daily in rehab to express subjective intensity level;Long Term:  Able to use RPE to guide intensity level when exercising independently       Knowledge and understanding of Target Heart Rate Range (THRR) Yes       Intervention Provide education and explanation of THRR including  how the numbers were predicted and where they are located for reference       Expected Outcomes Short Term: Able to state/look up THRR;Long Term: Able to use THRR to govern intensity when exercising independently;Short Term: Able to use daily as guideline for intensity in rehab        Able to check pulse independently Yes       Intervention Provide education and demonstration on how to check pulse in carotid and radial arteries.;Review the importance of being able to check your own pulse for safety during independent exercise       Expected Outcomes Short Term: Able to explain why pulse checking is important during independent exercise;Long Term: Able to check pulse independently and accurately       Understanding of Exercise Prescription Yes       Intervention Provide education, explanation, and written materials on patient's individual exercise prescription       Expected Outcomes Short Term: Able to explain program exercise prescription;Long Term: Able to explain home exercise prescription to exercise independently              Exercise Goals Re-Evaluation:  Exercise Goals Re-Evaluation    Row [name] 07/23/20 1620 08/13/20 1535 08/27/20 1610 09/12/20 1609       Exercise Goal Re-Evaluation   Exercise Goals Review Increase Physical Activity;Increase Strength and Stamina;Able to understand and use rate of perceived exertion (RPE) scale;Knowledge and understanding of Target Heart Rate Range (THRR);Able to check pulse independently;Understanding of Exercise Prescription;Able to understand and use Dyspnea scale Increase Physical Activity;Increase Strength and Stamina;Able to understand and use rate of perceived exertion (RPE) scale;Knowledge and understanding of Target Heart Rate Range (THRR);Understanding of Exercise Prescription Increase Physical Activity;Increase Strength and Stamina;Able to understand and use rate of perceived exertion (RPE) scale;Knowledge and understanding of Target Heart Rate Range (THRR);Understanding of Exercise Prescription Increase Physical Activity;Increase Strength and Stamina;Able to understand and use rate of perceived exertion (RPE) scale;Knowledge and understanding of Target Heart Rate Range (THRR);Understanding of Exercise Prescription    Comments  Pt's first day of exerise in the CRP2 program. Pt struggled with level 1 on nustep experencing SOB with an RPD = 3 and SaO2 @ 91%. Pt need to take 5 rest breaks to drink water and regain her breath. Instucted pt to not exceed 70 steps per minute on nustep. Once pt did this she was able to increase the amount of time between breaks. She understands the exercise prescription, THRR, the RPE and RPD scales. Reviewed Home exercise program with patient. Pt is making progress in the CRP2 program. Encouraged patient to begin walking at the ALF where she lives. We dicussed brekaing-up her exericse bouts to 3x/day for 10 minutes (2x/week). Pt will walk in the hallway outside her room to the elevator and back for 10 minutes using her rolling walker. Pt verbalized understanding of the home exercise plan and was provied a copy. Pt has been walking with the rolling walker the building where she lives. She goes to the dining room 2x/day and she also does extra walking in the hallway outside her room to the the elevator and back. She continues to c/o of getting SOB with walking long distances. Continue to encourage shorter walks with breaks more frequently to increase stamina. Pt graduated from the [facility] program today. Pt made slow progress in the program but has been able to demosrate increased stamina and strength based upon the results of the pre/post 6 minute walk test. Pt had an  average MET level of 1.8 and will continue by walking at home.    Expected Outcomes Will continue to monitor and progress patinet as tolerated. Pt will walk 2x/week for a total of 30 minutes. Pt will continue to walk in her building for a total of 30 minutes per day. Pt will walk at home in her ALF 30 minutes per day in 10 minute intervals.           Nutrition & Weight - Outcomes:  Pre Biometrics - 07/17/20 0929      Pre Biometrics   Waist Circumference 40 inches    Hip Circumference 46.5 inches    Waist to Hip Ratio 0.86 %    Triceps  Skinfold 25 mm    % Body Fat 42.9 %    Grip Strength 17 kg    Flexibility 14.5 in    Single Leg Stand 0 seconds   Not done: Pt uses walker and cane for mobility          Post Biometrics - 09/10/20 1500       Post  Biometrics   Height 5' 3"  (1.6 m)    Weight 76.9 kg    Waist Circumference 40 inches    Hip Circumference 47 inches    Waist to Hip Ratio 0.85 %    BMI (Calculated) 30.04    Triceps Skinfold 24 mm    % Body Fat 42.7 %    Grip Strength 19 kg    Flexibility 17.25 in    Single Leg Stand 0 seconds   Not done, uses cane/walker          Nutrition:  Nutrition Therapy & Goals - 09/04/20 0715      Nutrition Therapy   Diet Heart Healthy      Personal Nutrition Goals   Nutrition Goal Pt to identify food quantities necessary to achieve weight loss of 6-24 lb at graduation from cardiac rehab.    Personal Goal #2 CBG concentrations in the normal range or as close to normal as is safely possible.    Personal Goal #3 Improved blood glucose control as evidenced by pt's A1c trending from 8.3 toward less than 7.0.      Intervention Plan   Intervention Prescribe, educate and counsel regarding individualized specific dietary modifications aiming towards targeted core components such as weight, hypertension, lipid management, diabetes, heart failure and other comorbidities.;Nutrition handout(s) given to patient.    Expected Outcomes Short Term Goal: A plan has been developed with personal nutrition goals set during dietitian appointment.;Long Term Goal: Adherence to prescribed nutrition plan.           Nutrition Discharge:  Nutrition Assessments - 09/15/20 1516      MEDFICTS Scores   Pre Score 33           Education Questionnaire Score:  Knowledge Questionnaire Score - 09/05/20 1500      Knowledge Questionnaire Score   Post Score 23/24           Goals reviewed with patient; copy given to patient.Crystal graduated from cardiac rehab program today with completion  of  exercise sessions in Phase II. Pt maintained good attendance and progressed nicely during her participation in rehab as evidenced by increased MET level.   Medication list reconciled. Repeat  PHQ score-0  .  Pt has made significant lifestyle changes and should be commended for her success. Pt feels she has achieved her goals during cardiac rehab. Gy[place] increased her distance on her  post exercise walk test by 152 feet  Pt plans to continue exercise by using the nustep at her retirement facility. We are proud of Amandeep's progress!Barnet Pall, RN,BSN 09/19/2020 2:06 PM

## 2020-09-14 DIAGNOSIS — G4733 Obstructive sleep apnea (adult) (pediatric): Secondary | ICD-10-CM | POA: Diagnosis not present

## 2020-09-22 DIAGNOSIS — R2689 Other abnormalities of gait and mobility: Secondary | ICD-10-CM | POA: Diagnosis not present

## 2020-09-22 DIAGNOSIS — M6281 Muscle weakness (generalized): Secondary | ICD-10-CM | POA: Diagnosis not present

## 2020-09-24 ENCOUNTER — Ambulatory Visit (HOSPITAL_COMMUNITY)
Admission: RE | Admit: 2020-09-24 | Discharge: 2020-09-24 | Disposition: A | Discharge status: Home / Self Care | Payer: Medicare Other | Source: Ambulatory Visit | Attending: Vascular Surgery | Admitting: Vascular Surgery

## 2020-09-24 ENCOUNTER — Ambulatory Visit: Payer: Medicare Other | Admitting: Vascular Surgery

## 2020-09-24 ENCOUNTER — Other Ambulatory Visit: Payer: Self-pay

## 2020-09-24 ENCOUNTER — Encounter: Payer: Self-pay | Admitting: Vascular Surgery

## 2020-09-24 ENCOUNTER — Ambulatory Visit (INDEPENDENT_AMBULATORY_CARE_PROVIDER_SITE_OTHER)
Admission: RE | Admit: 2020-09-24 | Discharge: 2020-09-24 | Disposition: A | Discharge status: Home / Self Care | Payer: Medicare Other | Source: Ambulatory Visit | Attending: Vascular Surgery | Admitting: Vascular Surgery

## 2020-09-24 VITALS — BP 165/86 | HR 70 | Temp 98.0°F | Resp 20 | Ht 63.0 in | Wt 169.0 lb

## 2020-09-24 DIAGNOSIS — I739 Peripheral vascular disease, unspecified: Secondary | ICD-10-CM | POA: Insufficient documentation

## 2020-09-24 NOTE — Progress Notes (Signed)
REASON FOR VISIT:   Follow-up of peripheral vascular disease  MEDICAL ISSUES:   PERIPHERAL VASCULAR DISEASE: The patient's symptoms are stable.  I really do not get any history of rest pain.  She has stable claudication.  I encouraged her to stay as active as possible to help maintain collateral flow.  We also discussed the importance of nutrition.  I plan on seeing her back in 6 months and I have ordered ABIs and a duplex of her right iliac stent at that time.  She knows to call sooner if she has problems.  If her symptoms do progress then certainly we could consider kissing balloon angioplasty for the stenosis at her aortic bifurcation but this would be associated with some increased risk given her age.   HPI:   [name] is a pleasant 86 y.o. female who presented with rest pain bilaterally and evidence of multilevel arterial occlusive disease.  She underwent an arteriogram on 04/11/2020.  I performed angioplasty and stenting of the right external iliac artery with a 6 x 39 VBX stent.  I felt that by improving the inflow on the right this would potentially help her right leg symptoms.  If she developed critical limb ischemia on the left the only option would be kissing iliac stents at the aortic bifurcation which would be associated with increased risk given her age.  In addition she had an external iliac artery stenosis on the left which could potentially be addressed.  However we are trying to avoid an aggressive approach given her age.  Since I saw her last, she has some mild bilateral calf claudication.  She describes numbness in both feet but this feels better when she gets moving.  She has chronic back pain and I suspect her paresthesias in her legs are related to her degenerative disc disease of her back.  She does not have any rest pain.  She denies any nonhealing ulcers.  She is not a smoker.  She had a myocardial infarction in May and required PTCA.  Past Medical History:    Diagnosis Date  . Anemia    iron defficiency  . Anginal pain ([place])   . Arthritis    "knees, feet, hands; joints" (05/27/2015)  . Asthma   . Atrial fibrillation or flutter    s/p RFCA 7/08;   s/p DCCV in past;   previously on amiodarone;  amio stopped due to lung toxicity  . CAD (coronary artery disease)    s/p NSTEMI tx with BMS to OM1 3/08;  cath 3/08: pOM 99% tx with PCI, pLAD 20%, ? mod stenosis at the AM  . CAP (community acquired pneumonia) 05/24/2015  . Chronic diastolic heart failure (HCC)    echo 11/11:  EF 55-60%, severe LVH, mod LAE, mild MR, mildly increased PASP  . COPD (chronic obstructive pulmonary disease) ([place])   . Degenerative joint disease   . Dystrophy, corneal stromal   . Heart murmur   . HLD (hyperlipidemia)   . HTN (hypertension)    essential nos  . Hypopotassemia    PMH of  . Muscle pain   . Myocardial infarction ([place]) 2008  . OSA on CPAP   . Osteoporosis   . Pneumonia 05/27/2015  . Protein calorie malnutrition ([place])   . Rash and nonspecific skin eruption    both arms,awaiting bio   dr Delman Cheadle  . Seasonal allergies   . Shortness of breath dyspnea   . Type II diabetes mellitus ([place])  Dr Loanne Drilling    Family History  Problem Relation Age of Onset  . Diabetes Mother   . Hypertension Mother   . Transient ischemic attack Mother   . Arthritis Mother   . Heart attack Father 28  . Arthritis Father   . Hypertension Father   . Breast cancer Maternal Aunt   . Arthritis Maternal Aunt     SOCIAL HISTORY: Social History   Tobacco Use  . Smoking status: Former Smoker    Packs/day: 0.25    Years: 18.00    Pack years: 4.50    Types: Cigarettes    Quit date: 12/27/1968    Years since quitting: 51.7  . Smokeless tobacco: Never Used  . Tobacco comment: smoked [place], up to 1 pp week  Substance Use Topics  . Alcohol use: Yes    Comment: occ    Allergies  Allergen Reactions  . Levaquin [Levofloxacin] Shortness Of Breath and Swelling    angioedema   . Lobster [Shellfish Allergy] Other (See Comments)    angioedema  . Penicillins Rash    Has patient had a PCN reaction causing immediate rash, facial/tongue/throat swelling, SOB or lightheadedness with hypotension: Yes Has patient had a PCN reaction causing severe rash involving mucus membranes or skin necrosis: No Has patient had a PCN reaction that required hospitalization: No Has patient had a PCN reaction occurring within the last 10 years: No If all of the above answers are "NO", then may proceed with Cephalosporin use.  . Valsartan Other (See Comments)    REACTION: angioedema  . Codeine Other (See Comments)    Mental status changes  . Lipitor [Atorvastatin] Other (See Comments)    weakness  . Oxycodone Other (See Comments)    hallucinations  . Statins     Made her too weak  . Tramadol   . Amlodipine Besylate Other (See Comments)    REACTION: tingling in lips & gum edema 7/12: talked to patient, states she is tolerating well  . Crestor [Rosuvastatin Calcium] Rash  . Tramadol Hcl Nausea And Vomiting    Current Outpatient Medications  Medication Sig Dispense Refill  . acetaminophen (TYLENOL) 500 MG tablet Take 1,000 mg by mouth every 6 (six) hours as needed (pain).    Marland Kitchen amLODipine (NORVASC) 5 MG tablet Take 1 tablet (5 mg total) by mouth daily. 30 tablet 6  . apixaban (ELIQUIS) 5 MG TABS tablet Take 5 mg by mouth 2 (two) times daily.    Marland Kitchen CALCIUM-MAG-VIT C-VIT D PO Take 1 tablet by mouth daily.     . carvedilol (COREG) 6.25 MG tablet Take 6.25 mg by mouth 2 (two) times daily with a meal.    . cholecalciferol (VITAMIN D) 1000 units tablet Take 1,000 Units by mouth daily after supper.    . clopidogrel (PLAVIX) 75 MG tablet Take 1 tablet (75 mg total) by mouth daily with breakfast. 90 tablet 2  . empagliflozin (JARDIANCE) 10 MG TABS tablet Take 10 mg by mouth daily before breakfast. 90 tablet 3  . Evolocumab (REPATHA SURECLICK) 294 MG/ML SOAJ Inject 1 Dose into the skin every 14  (fourteen) days. 2 pen 11  . ezetimibe (ZETIA) 10 MG tablet Take 1 tablet (10 mg total) by mouth daily. 30 tablet 10  . furosemide (LASIX) 80 MG tablet TAKE 1 TABLET BY MOUTH TWO  TIMES DAILY 180 tablet 3  . glucose blood (ONETOUCH VERIO) test strip Use to monitor glucose levels 2 times daily 200 each 5  .  insulin NPH-regular Human (NOVOLIN 70/30 RELION) (70-30) 100 UNIT/ML injection Inject 31 Units into the skin daily with breakfast. And syringes 1/day 10 mL 11  . Lancets (ONETOUCH DELICA PLUS [id number]) MISC USE TO MONITOR GLUCOSE  LEVELS TWICE DAILY 200 each 3  . Multiple Vitamin (MULTIVITAMIN WITH MINERALS) TABS tablet Take 1 tablet by mouth daily. Centrum Silver    . nitroGLYCERIN (NITROSTAT) 0.4 MG SL tablet Place 1 tablet (0.4 mg total) under the tongue every 5 (five) minutes as needed for chest pain. Reported on 01/28/2016 30 tablet 3  . potassium chloride SA (KLOR-CON) 20 MEQ tablet Take 20 mEq by mouth 2 (two) times daily.     No current facility-administered medications for this visit.    REVIEW OF SYSTEMS:  [X]  denotes positive finding, [ ]  denotes negative finding Cardiac  Comments:  Chest pain or chest pressure:    Shortness of breath upon exertion: x   Short of breath when lying flat:    Irregular heart rhythm:        Vascular    Pain in calf, thigh, or hip brought on by ambulation:    Pain in feet at night that wakes you up from your sleep:     Blood clot in your veins:    Leg swelling:         Pulmonary    Oxygen at home:    Productive cough:     Wheezing:         Neurologic    Sudden weakness in arms or legs:     Sudden numbness in arms or legs:     Sudden onset of difficulty speaking or slurred speech:    Temporary loss of vision in one eye:     Problems with dizziness:         Gastrointestinal    Blood in stool:     Vomited blood:         Genitourinary    Burning when urinating:     Blood in urine:        Psychiatric    Major depression:           Hematologic    Bleeding problems:    Problems with blood clotting too easily:        Skin    Rashes or ulcers:        Constitutional    Fever or chills:     PHYSICAL EXAM:   Vitals:   09/24/20 1132 09/24/20 1134  BP: (!) 173/79 (!) 165/86  Pulse: 70   Resp: 20   Temp: 98 F (36.7 C)   SpO2: 92%   Weight: 169 lb (76.7 kg)   Height: 5\' 3"  (1.6 m)     GENERAL: The patient is a well-nourished female, in no acute distress. The vital signs are documented above. CARDIAC: There is a regular rate and rhythm.  VASCULAR: I do not detect carotid bruits.   She has a diminished right femoral pulse.  She has a diminished left femoral pulse. I cannot palpate femoral pulses. PULMONARY: There is good air exchange bilaterally without wheezing or rales. ABDOMEN: Soft and non-tender with normal pitched bowel sounds.  MUSCULOSKELETAL: There are no major deformities or cyanosis. NEUROLOGIC: No focal weakness or paresthesias are detected. SKIN: There are no ulcers or rashes noted. PSYCHIATRIC: The patient has a normal affect.  DATA:    ARTERIAL Doppler study: I have independently interpreted her arterial Doppler study today.  On the right side there  is a monophasic posterior tibial signal with an absent dorsalis pedis signal.  ABI is 36%.  Toe pressure is 0.  On the left side there is a monophasic dorsalis pedis and posterior tibial signal.  ABI is 40%.  Toe pressure is 0.  ARTERIOGRAM: Reviewed her previous arteriogram.  She had single renal arteries bilaterally with no significant renal artery stenosis.  Her infrarenal aorta was widely patent.  There was a focal eccentric plaque at the aortic bifurcation.  This was mostly on the left side.  She had successful angioplasty and stenting of a 70% right external iliac artery stenosis.  Below that on the right the common femoral and deep femoral artery were patent.  The superficial femoral artery was occluded in its origin with reconstitution of  the popliteal artery at the level of the knee.  On the right the tibial peroneal trunk was open the anterior tibial artery was occluded.  There was severe diffuse disease of the proximal posterior tibial artery but that was patent distally.  Peroneal artery was occluded proximally and reconstituted in the mid leg.  [name] [facility] [phone]

## 2020-09-25 DIAGNOSIS — M6281 Muscle weakness (generalized): Secondary | ICD-10-CM | POA: Diagnosis not present

## 2020-09-25 DIAGNOSIS — R2689 Other abnormalities of gait and mobility: Secondary | ICD-10-CM | POA: Diagnosis not present

## 2020-09-30 ENCOUNTER — Other Ambulatory Visit: Payer: Self-pay | Admitting: *Deleted

## 2020-09-30 DIAGNOSIS — R2689 Other abnormalities of gait and mobility: Secondary | ICD-10-CM | POA: Diagnosis not present

## 2020-09-30 DIAGNOSIS — I739 Peripheral vascular disease, unspecified: Secondary | ICD-10-CM

## 2020-09-30 DIAGNOSIS — M6281 Muscle weakness (generalized): Secondary | ICD-10-CM | POA: Diagnosis not present

## 2020-10-02 ENCOUNTER — Other Ambulatory Visit: Payer: Self-pay | Admitting: Endocrinology

## 2020-10-02 DIAGNOSIS — N183 Chronic kidney disease, stage 3 unspecified: Secondary | ICD-10-CM

## 2020-10-03 DIAGNOSIS — M6281 Muscle weakness (generalized): Secondary | ICD-10-CM | POA: Diagnosis not present

## 2020-10-03 DIAGNOSIS — R2689 Other abnormalities of gait and mobility: Secondary | ICD-10-CM | POA: Diagnosis not present

## 2020-10-06 DIAGNOSIS — R2689 Other abnormalities of gait and mobility: Secondary | ICD-10-CM | POA: Diagnosis not present

## 2020-10-06 DIAGNOSIS — M6281 Muscle weakness (generalized): Secondary | ICD-10-CM | POA: Diagnosis not present

## 2020-10-08 DIAGNOSIS — R2689 Other abnormalities of gait and mobility: Secondary | ICD-10-CM | POA: Diagnosis not present

## 2020-10-08 DIAGNOSIS — M6281 Muscle weakness (generalized): Secondary | ICD-10-CM | POA: Diagnosis not present

## 2020-10-10 DIAGNOSIS — R2689 Other abnormalities of gait and mobility: Secondary | ICD-10-CM | POA: Diagnosis not present

## 2020-10-10 DIAGNOSIS — M6281 Muscle weakness (generalized): Secondary | ICD-10-CM | POA: Diagnosis not present

## 2020-10-13 DIAGNOSIS — M6281 Muscle weakness (generalized): Secondary | ICD-10-CM | POA: Diagnosis not present

## 2020-10-13 DIAGNOSIS — R2689 Other abnormalities of gait and mobility: Secondary | ICD-10-CM | POA: Diagnosis not present

## 2020-10-14 DIAGNOSIS — G4733 Obstructive sleep apnea (adult) (pediatric): Secondary | ICD-10-CM | POA: Diagnosis not present

## 2020-10-15 DIAGNOSIS — M6281 Muscle weakness (generalized): Secondary | ICD-10-CM | POA: Diagnosis not present

## 2020-10-15 DIAGNOSIS — R2689 Other abnormalities of gait and mobility: Secondary | ICD-10-CM | POA: Diagnosis not present

## 2020-10-17 DIAGNOSIS — M6281 Muscle weakness (generalized): Secondary | ICD-10-CM | POA: Diagnosis not present

## 2020-10-17 DIAGNOSIS — R2689 Other abnormalities of gait and mobility: Secondary | ICD-10-CM | POA: Diagnosis not present

## 2020-10-20 DIAGNOSIS — R2689 Other abnormalities of gait and mobility: Secondary | ICD-10-CM | POA: Diagnosis not present

## 2020-10-20 DIAGNOSIS — M6281 Muscle weakness (generalized): Secondary | ICD-10-CM | POA: Diagnosis not present

## 2020-10-22 DIAGNOSIS — R2689 Other abnormalities of gait and mobility: Secondary | ICD-10-CM | POA: Diagnosis not present

## 2020-10-22 DIAGNOSIS — M6281 Muscle weakness (generalized): Secondary | ICD-10-CM | POA: Diagnosis not present

## 2020-10-24 DIAGNOSIS — R2689 Other abnormalities of gait and mobility: Secondary | ICD-10-CM | POA: Diagnosis not present

## 2020-10-24 DIAGNOSIS — M6281 Muscle weakness (generalized): Secondary | ICD-10-CM | POA: Diagnosis not present

## 2020-10-25 DIAGNOSIS — Z03818 Encounter for observation for suspected exposure to other biological agents ruled out: Secondary | ICD-10-CM | POA: Diagnosis not present

## 2020-10-29 DIAGNOSIS — M6281 Muscle weakness (generalized): Secondary | ICD-10-CM | POA: Diagnosis not present

## 2020-10-29 DIAGNOSIS — R2689 Other abnormalities of gait and mobility: Secondary | ICD-10-CM | POA: Diagnosis not present

## 2020-10-30 DIAGNOSIS — M6281 Muscle weakness (generalized): Secondary | ICD-10-CM | POA: Diagnosis not present

## 2020-10-30 DIAGNOSIS — R2689 Other abnormalities of gait and mobility: Secondary | ICD-10-CM | POA: Diagnosis not present

## 2020-10-31 DIAGNOSIS — M6281 Muscle weakness (generalized): Secondary | ICD-10-CM | POA: Diagnosis not present

## 2020-10-31 DIAGNOSIS — R2689 Other abnormalities of gait and mobility: Secondary | ICD-10-CM | POA: Diagnosis not present

## 2020-11-03 DIAGNOSIS — M6281 Muscle weakness (generalized): Secondary | ICD-10-CM | POA: Diagnosis not present

## 2020-11-03 DIAGNOSIS — R2689 Other abnormalities of gait and mobility: Secondary | ICD-10-CM | POA: Diagnosis not present

## 2020-11-04 ENCOUNTER — Telehealth: Payer: Self-pay | Admitting: Internal Medicine

## 2020-11-04 DIAGNOSIS — R6889 Other general symptoms and signs: Secondary | ICD-10-CM

## 2020-11-04 NOTE — Telephone Encounter (Signed)
Patient states she has been having issues with her ear and wanted to know if she can be referred to an ENT. She states she has found Dr. Redmond Baseman at [place], [place] and [place] [phone]

## 2020-11-04 NOTE — Telephone Encounter (Signed)
Referral ordered

## 2020-11-05 DIAGNOSIS — R2689 Other abnormalities of gait and mobility: Secondary | ICD-10-CM | POA: Diagnosis not present

## 2020-11-05 DIAGNOSIS — M6281 Muscle weakness (generalized): Secondary | ICD-10-CM | POA: Diagnosis not present

## 2020-11-07 DIAGNOSIS — R2689 Other abnormalities of gait and mobility: Secondary | ICD-10-CM | POA: Diagnosis not present

## 2020-11-07 DIAGNOSIS — M6281 Muscle weakness (generalized): Secondary | ICD-10-CM | POA: Diagnosis not present

## 2020-11-10 DIAGNOSIS — M6281 Muscle weakness (generalized): Secondary | ICD-10-CM | POA: Diagnosis not present

## 2020-11-10 DIAGNOSIS — R2689 Other abnormalities of gait and mobility: Secondary | ICD-10-CM | POA: Diagnosis not present

## 2020-11-12 ENCOUNTER — Telehealth: Payer: Self-pay | Admitting: Pharmacist

## 2020-11-12 DIAGNOSIS — M6281 Muscle weakness (generalized): Secondary | ICD-10-CM | POA: Diagnosis not present

## 2020-11-12 DIAGNOSIS — R2689 Other abnormalities of gait and mobility: Secondary | ICD-10-CM | POA: Diagnosis not present

## 2020-11-12 NOTE — Progress Notes (Addendum)
Chronic Care Management Pharmacy Assistant   Name: [name]  MRN: [id number] DOB: 03-Jul-1931  Reason for Encounter: General Adherence Call  Patient Questions:  1.  Have you seen any other providers since your last visit? Yes, patient last seen Dr. Quay Burow 08/19/20, Dr. Renato Shin 08/21/20 and Dr. [name] on 09/24/20  2.  Any changes in your medicines or health? No    PCP : [name], MD  Allergies:   Allergies  Allergen Reactions   Levaquin [Levofloxacin] Shortness Of Breath and Swelling    angioedema   Lobster [Shellfish Allergy] Other (See Comments)    angioedema   Penicillins Rash    Has patient had a PCN reaction causing immediate rash, facial/tongue/throat swelling, SOB or lightheadedness with hypotension: Yes Has patient had a PCN reaction causing severe rash involving mucus membranes or skin necrosis: No Has patient had a PCN reaction that required hospitalization: No Has patient had a PCN reaction occurring within the last 10 years: No If all of the above answers are "NO", then may proceed with Cephalosporin use.   Valsartan Other (See Comments)    REACTION: angioedema   Codeine Other (See Comments)    Mental status changes   Lipitor [Atorvastatin] Other (See Comments)    weakness   Oxycodone Other (See Comments)    hallucinations   Statins     Made her too weak   Tramadol    Amlodipine Besylate Other (See Comments)    REACTION: tingling in lips & gum edema 7/12: talked to patient, states she is tolerating well   Crestor [Rosuvastatin Calcium] Rash   Tramadol Hcl Nausea And Vomiting    Medications: Outpatient Encounter Medications as of 11/12/2020  Medication Sig   acetaminophen (TYLENOL) 500 MG tablet Take 1,000 mg by mouth every 6 (six) hours as needed (pain).   amLODipine (NORVASC) 5 MG tablet Take 1 tablet (5 mg total) by mouth daily.   apixaban (ELIQUIS) 5 MG TABS tablet Take 5 mg by mouth 2 (two) times daily.    CALCIUM-MAG-VIT C-VIT D PO Take 1 tablet by mouth daily.    carvedilol (COREG) 6.25 MG tablet Take 6.25 mg by mouth 2 (two) times daily with a meal.   cholecalciferol (VITAMIN D) 1000 units tablet Take 1,000 Units by mouth daily after supper.   clopidogrel (PLAVIX) 75 MG tablet Take 1 tablet (75 mg total) by mouth daily with breakfast.   empagliflozin (JARDIANCE) 10 MG TABS tablet Take 10 mg by mouth daily before breakfast.   Evolocumab (REPATHA SURECLICK) 270 MG/ML SOAJ Inject 1 Dose into the skin every 14 (fourteen) days.   ezetimibe (ZETIA) 10 MG tablet Take 1 tablet (10 mg total) by mouth daily.   furosemide (LASIX) 80 MG tablet TAKE 1 TABLET BY MOUTH TWO  TIMES DAILY   glucose blood (ONETOUCH VERIO) test strip USE TO MONITOR GLUCOSE  LEVELS TWICE DAILY   insulin NPH-regular Human (NOVOLIN 70/30 RELION) (70-30) 100 UNIT/ML injection Inject 31 Units into the skin daily with breakfast. And syringes 1/day   Lancets (ONETOUCH DELICA PLUS [id number]) MISC USE TO MONITOR GLUCOSE  LEVELS TWICE DAILY   Multiple Vitamin (MULTIVITAMIN WITH MINERALS) TABS tablet Take 1 tablet by mouth daily. Centrum Silver   nitroGLYCERIN (NITROSTAT) 0.4 MG SL tablet Place 1 tablet (0.4 mg total) under the tongue every 5 (five) minutes as needed for chest pain. Reported on 01/28/2016   potassium chloride SA (KLOR-CON) 20 MEQ tablet Take 20 mEq by mouth  2 (two) times daily.   No facility-administered encounter medications on file as of 11/12/2020.    Current Diagnosis: Patient Active Problem List   Diagnosis Date Noted   Neuropathy 08/19/2020   PAD (peripheral artery disease) ([place]) 02/29/2020   Chronic kidney disease (CKD) 02/20/2020   Osteoarthritis 08/01/2019   Arthritis of left sacroiliac joint 05/04/2017   Osteopenia 02/17/2017   Piriformis syndrome of left side 02/16/2017   Acquired leg length discrepancy 02/16/2017   COPD GOLD I  01/27/2017   Lumbar radiculopathy, acute 01/27/2017   Granuloma annulare  07/27/2016   Numbness of fingers 07/27/2016   Primary osteoarthritis of right knee 06/27/2016   Rash and nonspecific skin eruption 06/09/2016   Diabetes ([place]) 03/07/2016   OSA  08/28/2014   Chronic diastolic heart failure ([place]) 07/30/2014   Nocturnal hypoxemia 07/09/2014   SOB (shortness of breath) 05/15/2014   Pulmonary HTN ([place]) 05/15/2014   Band keratopathy 01/16/2014   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   Hyperthyroidism 11/04/2011   Multinodular goiter 10/14/2011   Atrophy, Fuchs' 09/29/2011   ATRIAL FIBRILLATION 06/03/2010   VITILIGO 11/07/2009   SINOATRIAL NODE DYSFUNCTION 06/16/2009   Atrial flutter ([place]) 06/25/2008   HYPERLIPIDEMIA 01/29/2008   Coronary atherosclerosis 01/29/2008   HTN (hypertension) 10/30/2007    Goals Addressed   None     Follow-Up:  Pharmacist Review   Recent Relevant Labs: Lab Results  Component Value Date/Time   HGBA1C 7.8 (A) 08/21/2020 10:19 AM   HGBA1C 8.3 (H) 05/15/2020 05:22 PM   HGBA1C 8.0 (A) 05/06/2020 10:03 AM   HGBA1C 9.2 (H) 02/20/2020 12:39 PM   MICROALBUR 11.9 (H) 07/31/2018 10:22 AM   MICROALBUR 1.6 09/08/2016 11:59 AM    Kidney Function Lab Results  Component Value Date/Time   CREATININE 1.23 (H) 06/09/2020 11:33 AM   CREATININE 1.07 (H) 05/17/2020 04:27 AM   GFR 53.03 (L) 02/20/2020 12:39 PM   GFRNONAA 40 (L) 06/09/2020 11:33 AM   GFRAA 46 (L) 06/09/2020 11:33 AM    Current antihyperglycemic regimen: Novolin 70/30 and Jardiance 10 mg  What recent interventions/DTPs have been made to improve glycemic control: The patient ask to walk, walk, walk to keep circulation in legs and feet moving  Have there been any recent hospitalizations or ED visits since last visit with CPP? No  Patient reports that only one time that her blood sugar was below 70 but no hypoglycemic symptoms  Patient {reports that one night she ate an orange and then took her blood sugar which read 400, then she took her insulin and woke up  about 4 in the morning drank some water and was ok   How often are you checking your blood sugar? The patient takes blood sugar once a day, but since the holidays she has not taken it  During the week, how often does your blood glucose drop below 70?   Are you checking your feet daily/regularly? Yes poor circulation in feet and legs but has physical thepay and goes to vascular doctor  Adherence Review: Is the patient currently on a STATIN medication? No Is the patient currently on ACE/ARB medication? No Does the patient have >5 day gap between last estimated fill dates?     Wendy Poet, Clinical Pharmacist Assistant [facility]

## 2020-11-14 DIAGNOSIS — G4733 Obstructive sleep apnea (adult) (pediatric): Secondary | ICD-10-CM | POA: Diagnosis not present

## 2020-11-17 ENCOUNTER — Telehealth: Payer: Self-pay | Admitting: Internal Medicine

## 2020-11-17 ENCOUNTER — Other Ambulatory Visit: Payer: Self-pay | Admitting: *Deleted

## 2020-11-17 DIAGNOSIS — M6281 Muscle weakness (generalized): Secondary | ICD-10-CM | POA: Diagnosis not present

## 2020-11-17 DIAGNOSIS — R2689 Other abnormalities of gait and mobility: Secondary | ICD-10-CM | POA: Diagnosis not present

## 2020-11-17 DIAGNOSIS — E782 Mixed hyperlipidemia: Secondary | ICD-10-CM

## 2020-11-17 NOTE — Telephone Encounter (Signed)
Lipid panel order & reminder for lipid clinic f/u mailed to patient Recall due for Jan/Feb 2022

## 2020-11-19 DIAGNOSIS — R2689 Other abnormalities of gait and mobility: Secondary | ICD-10-CM | POA: Diagnosis not present

## 2020-11-19 DIAGNOSIS — M6281 Muscle weakness (generalized): Secondary | ICD-10-CM | POA: Diagnosis not present

## 2020-11-25 DIAGNOSIS — H6123 Impacted cerumen, bilateral: Secondary | ICD-10-CM | POA: Insufficient documentation

## 2020-11-25 DIAGNOSIS — J343 Hypertrophy of nasal turbinates: Secondary | ICD-10-CM | POA: Diagnosis not present

## 2020-11-26 DIAGNOSIS — M6281 Muscle weakness (generalized): Secondary | ICD-10-CM | POA: Diagnosis not present

## 2020-11-26 DIAGNOSIS — R2689 Other abnormalities of gait and mobility: Secondary | ICD-10-CM | POA: Diagnosis not present

## 2020-11-28 DIAGNOSIS — M6281 Muscle weakness (generalized): Secondary | ICD-10-CM | POA: Diagnosis not present

## 2020-11-28 DIAGNOSIS — R2689 Other abnormalities of gait and mobility: Secondary | ICD-10-CM | POA: Diagnosis not present

## 2020-12-01 DIAGNOSIS — R2689 Other abnormalities of gait and mobility: Secondary | ICD-10-CM | POA: Diagnosis not present

## 2020-12-01 DIAGNOSIS — M6281 Muscle weakness (generalized): Secondary | ICD-10-CM | POA: Diagnosis not present

## 2020-12-02 ENCOUNTER — Encounter: Payer: Self-pay | Admitting: Endocrinology

## 2020-12-02 ENCOUNTER — Other Ambulatory Visit: Payer: Self-pay

## 2020-12-02 ENCOUNTER — Ambulatory Visit (INDEPENDENT_AMBULATORY_CARE_PROVIDER_SITE_OTHER): Payer: Medicare Other | Admitting: Endocrinology

## 2020-12-02 DIAGNOSIS — Z794 Long term (current) use of insulin: Secondary | ICD-10-CM | POA: Diagnosis not present

## 2020-12-02 DIAGNOSIS — N183 Chronic kidney disease, stage 3 unspecified: Secondary | ICD-10-CM | POA: Diagnosis not present

## 2020-12-02 DIAGNOSIS — E1122 Type 2 diabetes mellitus with diabetic chronic kidney disease: Secondary | ICD-10-CM | POA: Diagnosis not present

## 2020-12-02 LAB — POCT GLYCOSYLATED HEMOGLOBIN (HGB A1C): Hemoglobin A1C: 9.5 % — AB (ref 4.0–5.6)

## 2020-12-02 MED ORDER — NOVOLIN 70/30 RELION (70-30) 100 UNIT/ML ~~LOC~~ SUSP: 36.0000 [IU] | Freq: Every day | SUBCUTANEOUS | 3 refills | Status: DC

## 2020-12-02 MED ORDER — NOVOLIN 70/30 RELION (70-30) 100 UNIT/ML ~~LOC~~ SUSP: 36.0000 [IU] | Freq: Every day | SUBCUTANEOUS | 11 refills | Status: DC

## 2020-12-02 NOTE — Progress Notes (Signed)
Subjective:    Patient ID: [name], female    DOB: [birth year], 86 y.o.   MRN: [id number]  HPI Pt returns for f/u of diabetes mellitus:  DM type: Insulin-requiring type 2.   Dx'ed: 8921 Complications: CAD, PN, PAD, and stage 3 CRI.  Therapy: insulin since 2007, and Jardiance.   GDM: never.  DKA: never. Severe hypoglycemia: never.   Pancreatitis: never.   SDOH: she takes human insulin, due to cost.   Other: she changed to qd insulin, after poor results with multiple daily injections; she was changed from lantus to QAM NPH, then to 70/30, due to am hypoglycemia and PM hyperglycemia.   Interval history: no cbg record, but states cbg varies from 150-200's.  It is in general highest fasting.  She has had only 1 episode of hypoglycemia since last ov here, and that was mild.  This happened when she took insulin also at HS, for elev cbg then.   Past Medical History:  Diagnosis Date  . Anemia    iron defficiency  . Anginal pain ([place])   . Arthritis    "knees, feet, hands; joints" (05/27/2015)  . Asthma   . Atrial fibrillation or flutter    s/p RFCA 7/08;   s/p DCCV in past;   previously on amiodarone;  amio stopped due to lung toxicity  . CAD (coronary artery disease)    s/p NSTEMI tx with BMS to OM1 3/08;  cath 3/08: pOM 99% tx with PCI, pLAD 20%, ? mod stenosis at the AM  . CAP (community acquired pneumonia) 05/24/2015  . Chronic diastolic heart failure (HCC)    echo 11/11:  EF 55-60%, severe LVH, mod LAE, mild MR, mildly increased PASP  . COPD (chronic obstructive pulmonary disease) ([place])   . Degenerative joint disease   . Dystrophy, corneal stromal   . Heart murmur   . HLD (hyperlipidemia)   . HTN (hypertension)    essential nos  . Hypopotassemia    PMH of  . Muscle pain   . Myocardial infarction ([place]) 2008  . OSA on CPAP   . Osteoporosis   . Pneumonia 05/27/2015  . Protein calorie malnutrition ([place])   . Rash and nonspecific skin eruption    both arms,awaiting bio    dr Delman Cheadle  . Seasonal allergies   . Shortness of breath dyspnea   . Type II diabetes mellitus ([place])    Dr Loanne Drilling    Past Surgical History:  Procedure Laterality Date  . A FLUTTER ABLATION     Dr Lovena Le  . ABDOMINAL AORTOGRAM W/LOWER EXTREMITY Bilateral 04/11/2020   Procedure: ABDOMINAL AORTOGRAM W/LOWER EXTREMITY;  Surgeon: Angelia Mould, MD;  Location: [facility];  Service: Cardiovascular;  Laterality: Bilateral;  . CATARACT EXTRACTION W/ INTRAOCULAR LENS  IMPLANT, BILATERAL Bilateral   . CHOLECYSTECTOMY    . COLONOSCOPY  6/07   2 polyps Dr. Kinnie Feil in [place]  . colonoscopy with polypectomy      X 2; [facility]  . CORNEAL TRANSPLANT Bilateral   . CORONARY ANGIOPLASTY WITH STENT PLACEMENT  02/2007   BMS w/Dr Lovena Le  . CORONARY ATHERECTOMY N/A 05/16/2020   Procedure: CORONARY ATHERECTOMY;  Surgeon: Martinique, Peter M, MD;  Location: [facility];  Service: Cardiovascular;  Laterality: N/A;  . CORONARY BALLOON ANGIOPLASTY N/A 05/16/2020   Procedure: CORONARY BALLOON ANGIOPLASTY;  Surgeon: Martinique, Peter M, MD;  Location: [facility];  Service: Cardiovascular;  Laterality: N/A;  . CORONARY STENT INTERVENTION  N/A 05/16/2020   Procedure: CORONARY STENT INTERVENTION;  Surgeon: Martinique, Peter M, MD;  Location: [facility];  Service: Cardiovascular;  Laterality: N/A;  . EYE SURGERY    . gravid 2 para 2    . INTRAVASCULAR ULTRASOUND/IVUS N/A 05/16/2020   Procedure: Intravascular Ultrasound/IVUS;  Surgeon: Martinique, Peter M, MD;  Location: [facility];  Service: Cardiovascular;  Laterality: N/A;  . KNEE CARTILAGE SURGERY Right   . LEFT AND RIGHT HEART CATHETERIZATION WITH CORONARY ANGIOGRAM N/A 05/07/2014   Procedure: LEFT AND RIGHT HEART CATHETERIZATION WITH CORONARY ANGIOGRAM;  Surgeon: Peter M Martinique, MD;  Location: [facility];  Service: Cardiovascular;  Laterality: N/A;  . PERIPHERAL VASCULAR INTERVENTION  04/11/2020   Procedure: PERIPHERAL VASCULAR  INTERVENTION;  Surgeon: Angelia Mould, MD;  Location: [facility];  Service: Cardiovascular;;  . RIGHT/LEFT HEART CATH AND CORONARY ANGIOGRAPHY N/A 05/16/2020   Procedure: RIGHT/LEFT HEART CATH AND CORONARY ANGIOGRAPHY;  Surgeon: Jolaine Artist, MD;  Location: [facility];  Service: Cardiovascular;  Laterality: N/A;  . TOTAL KNEE ARTHROPLASTY Right 06/28/2016   Procedure: TOTAL KNEE ARTHROPLASTY;  Surgeon: Frederik Pear, MD;  Location: [facility];  Service: Orthopedics;  Laterality: Right;    Social History   Socioeconomic History  . Marital status: Widowed    Spouse name: Not on file  . Number of children: 2  . Years of education: 16  . Highest education level: Bachelor's degree (e.g., BA, AB, BS)  Occupational History  . Occupation: Recruitment consultant Retired  Tobacco Use  . Smoking status: Former Smoker    Packs/day: 0.25    Years: 18.00    Pack years: 4.50    Types: Cigarettes    Quit date: 12/27/1968    Years since quitting: 51.9  . Smokeless tobacco: Never Used  . Tobacco comment: smoked [place], up to 1 pp week  Vaping Use  . Vaping Use: Never used  Substance and Sexual Activity  . Alcohol use: Yes    Comment: occ  . Drug use: No  . Sexual activity: Never  Other Topics Concern  . Not on file  Social History Narrative   Teacher. Widowed. Rarely drinks cafeine.    Social Determinants of Health   Financial Resource Strain: Low Risk   . Difficulty of Paying Living Expenses: Not very hard  Food Insecurity: Not on file  Transportation Needs: Not on file  Physical Activity: Not on file  Stress: Not on file  Social Connections: Not on file  Intimate Partner Violence: Not on file    Current Outpatient Medications on File Prior to Visit  Medication Sig Dispense Refill  . acetaminophen (TYLENOL) 500 MG tablet Take 1,000 mg by mouth every 6 (six) hours as needed (pain).    Marland Kitchen amLODipine (NORVASC) 5 MG tablet Take 1 tablet (5 mg total) by mouth daily. 30 tablet  6  . CALCIUM-MAG-VIT C-VIT D PO Take 1 tablet by mouth daily.     . carvedilol (COREG) 6.25 MG tablet Take 6.25 mg by mouth 2 (two) times daily with a meal.    . cholecalciferol (VITAMIN D) 1000 units tablet Take 1,000 Units by mouth daily after supper.    . clopidogrel (PLAVIX) 75 MG tablet Take 1 tablet (75 mg total) by mouth daily with breakfast. 90 tablet 2  . empagliflozin (JARDIANCE) 10 MG TABS tablet Take 10 mg by mouth daily before breakfast. 90 tablet 3  . Evolocumab (REPATHA SURECLICK) 564 MG/ML SOAJ Inject 1  Dose into the skin every 14 (fourteen) days. 2 pen 11  . ezetimibe (ZETIA) 10 MG tablet Take 1 tablet (10 mg total) by mouth daily. 30 tablet 10  . furosemide (LASIX) 80 MG tablet TAKE 1 TABLET BY MOUTH TWO  TIMES DAILY 180 tablet 3  . glucose blood (ONETOUCH VERIO) test strip USE TO MONITOR GLUCOSE  LEVELS TWICE DAILY 200 strip 3  . Lancets (ONETOUCH DELICA PLUS [id number]) MISC USE TO MONITOR GLUCOSE  LEVELS TWICE DAILY 200 each 3  . Multiple Vitamin (MULTIVITAMIN WITH MINERALS) TABS tablet Take 1 tablet by mouth daily. Centrum Silver    . nitroGLYCERIN (NITROSTAT) 0.4 MG SL tablet Place 1 tablet (0.4 mg total) under the tongue every 5 (five) minutes as needed for chest pain. Reported on 01/28/2016 30 tablet 3  . potassium chloride SA (KLOR-CON) 20 MEQ tablet Take 20 mEq by mouth 2 (two) times daily.     No current facility-administered medications on file prior to visit.    Allergies  Allergen Reactions  . Levaquin [Levofloxacin] Shortness Of Breath and Swelling    angioedema  . Lobster [Shellfish Allergy] Other (See Comments)    angioedema  . Penicillins Rash    Has patient had a PCN reaction causing immediate rash, facial/tongue/throat swelling, SOB or lightheadedness with hypotension: Yes Has patient had a PCN reaction causing severe rash involving mucus membranes or skin necrosis: No Has patient had a PCN reaction that required hospitalization: No Has patient had a PCN  reaction occurring within the last 10 years: No If all of the above answers are "NO", then may proceed with Cephalosporin use.  . Valsartan Other (See Comments)    REACTION: angioedema  . Codeine Other (See Comments)    Mental status changes  . Lipitor [Atorvastatin] Other (See Comments)    weakness  . Oxycodone Other (See Comments)    hallucinations  . Statins     Made her too weak  . Tramadol   . Amlodipine Besylate Other (See Comments)    REACTION: tingling in lips & gum edema 7/12: talked to patient, states she is tolerating well  . Crestor [Rosuvastatin Calcium] Rash  . Tramadol Hcl Nausea And Vomiting    Family History  Problem Relation Age of Onset  . Diabetes Mother   . Hypertension Mother   . Transient ischemic attack Mother   . Arthritis Mother   . Heart attack Father 33  . Arthritis Father   . Hypertension Father   . Breast cancer Maternal Aunt   . Arthritis Maternal Aunt     BP 130/60   Pulse 78   Ht 5\' 3"  (1.6 m)   Wt 162 lb 9.6 oz (73.8 kg)   SpO2 91%   BMI 28.80 kg/m    Review of Systems     Objective:   Physical Exam VITAL SIGNS:  See vs page GENERAL: no distress Pulses: dorsalis pedis absent bilat.   MSK: no deformity of the feet CV: trace bilat leg edema, and bilat vv's. Skin:  no ulcer on the feet.  normal color and temp on the feet. Neuro: sensation is intact to touch on the feet, but decreased from normal.    A1c=9.5%     Assessment & Plan:  Insulin-requiring type 2 DM, with PAD Hypoglycemia, due to insulin: this limits aggressiveness of glycemic control   Patient Instructions  Please increase the insulin to 36 units with breakfast. On this type of insulin schedule, you should eat meals on a  regular schedule.  If a meal is missed or significantly delayed, your blood sugar could go low.  check your blood sugar twice a day.  vary the time of day when you check, between before the 3 meals, and at bedtime.  also check if you have  symptoms of your blood sugar being too high or too low.  please keep a record of the readings and bring it to your next appointment here (or you can bring the meter itself).  You can write it on any piece of paper.  please call us sooner if your blood sugar goes below 70, or if you have a lot of readings over 200. Please come back for a follow-up appointment in 2 months.

## 2020-12-02 NOTE — Patient Instructions (Addendum)
Please increase the insulin to 36 units with breakfast. On this type of insulin schedule, you should eat meals on a regular schedule.  If a meal is missed or significantly delayed, your blood sugar could go low.  check your blood sugar twice a day.  vary the time of day when you check, between before the 3 meals, and at bedtime.  also check if you have symptoms of your blood sugar being too high or too low.  please keep a record of the readings and bring it to your next appointment here (or you can bring the meter itself).  You can write it on any piece of paper.  please call us sooner if your blood sugar goes below 70, or if you have a lot of readings over 200. Please come back for a follow-up appointment in 2 months.

## 2020-12-03 ENCOUNTER — Encounter: Payer: Self-pay | Admitting: Endocrinology

## 2020-12-03 ENCOUNTER — Other Ambulatory Visit (HOSPITAL_COMMUNITY): Payer: Self-pay | Admitting: Internal Medicine

## 2020-12-03 DIAGNOSIS — M6281 Muscle weakness (generalized): Secondary | ICD-10-CM | POA: Diagnosis not present

## 2020-12-03 DIAGNOSIS — R2689 Other abnormalities of gait and mobility: Secondary | ICD-10-CM | POA: Diagnosis not present

## 2020-12-03 MED ORDER — INSULIN LISPRO PROT & LISPRO (75-25 MIX) 100 UNIT/ML KWIKPEN: 36.0000 [IU] | PEN_INJECTOR | Freq: Every day | SUBCUTANEOUS | 3 refills | Status: DC

## 2020-12-04 ENCOUNTER — Telehealth: Payer: Self-pay | Admitting: *Deleted

## 2020-12-04 ENCOUNTER — Telehealth: Payer: Self-pay | Admitting: Internal Medicine

## 2020-12-04 NOTE — Telephone Encounter (Signed)
FYI

## 2020-12-04 NOTE — Telephone Encounter (Signed)
[facility] called stating they will send a fax to confirm quality of pin needles

## 2020-12-04 NOTE — Telephone Encounter (Signed)
Pt c/o medication issue:  1. Name of Medication: Evolocumab [facility] SURECLICK) 005 MG/ML SOAJ [[id number]    2. How are you currently taking this medication (dosage and times per day)? Inject 1 Dose into the skin every 14 (fourteen) days.  3. Are you having a reaction (difficulty breathing--STAT)? no  4. What is your medication issue? Pt stated she is in [place] and has not taken this med since nov 2021 because she can not afford it .  This is just a [place] ,  Best number [phone]

## 2020-12-05 DIAGNOSIS — R2689 Other abnormalities of gait and mobility: Secondary | ICD-10-CM | POA: Diagnosis not present

## 2020-12-05 DIAGNOSIS — M6281 Muscle weakness (generalized): Secondary | ICD-10-CM | POA: Diagnosis not present

## 2020-12-05 NOTE — Telephone Encounter (Signed)
MyChart message sent to patient with link to patient assistance application Will check on samples when onsite

## 2020-12-06 ENCOUNTER — Encounter: Payer: Self-pay | Admitting: Endocrinology

## 2020-12-08 DIAGNOSIS — G4733 Obstructive sleep apnea (adult) (pediatric): Secondary | ICD-10-CM | POA: Diagnosis not present

## 2020-12-09 ENCOUNTER — Telehealth: Payer: Self-pay | Admitting: Internal Medicine

## 2020-12-09 DIAGNOSIS — R2689 Other abnormalities of gait and mobility: Secondary | ICD-10-CM | POA: Diagnosis not present

## 2020-12-09 DIAGNOSIS — M6281 Muscle weakness (generalized): Secondary | ICD-10-CM | POA: Diagnosis not present

## 2020-12-09 MED ORDER — INSULIN LISPRO PROT & LISPRO (75-25 MIX) 100 UNIT/ML KWIKPEN: PEN_INJECTOR | SUBCUTANEOUS | 11 refills | Status: DC

## 2020-12-09 NOTE — Telephone Encounter (Signed)
PA for repatha submitted via CMM (Key: [id number])

## 2020-12-12 DIAGNOSIS — M6281 Muscle weakness (generalized): Secondary | ICD-10-CM | POA: Diagnosis not present

## 2020-12-12 DIAGNOSIS — R2689 Other abnormalities of gait and mobility: Secondary | ICD-10-CM | POA: Diagnosis not present

## 2020-12-14 DIAGNOSIS — G4733 Obstructive sleep apnea (adult) (pediatric): Secondary | ICD-10-CM | POA: Diagnosis not present

## 2020-12-15 NOTE — Telephone Encounter (Signed)
PA request denied as attestation of info submitted being true and accurate to best of provider's knowledge was not provided to [facility] in their specified time frame  Appeal submitted via fax to [phone]

## 2020-12-15 NOTE — Telephone Encounter (Addendum)
Repatha Sureclick sample left in pharmacy [facility] fridge for patient to pick up Left message regarding this

## 2020-12-16 ENCOUNTER — Other Ambulatory Visit: Payer: Self-pay | Admitting: Cardiology

## 2020-12-17 ENCOUNTER — Telehealth: Payer: Self-pay | Admitting: Internal Medicine

## 2020-12-17 NOTE — Telephone Encounter (Signed)
Called pt to inform her that an envelope containing patient assistance application for Repatha would be included with her samples. The pt states she relies on shuttle transportation from her assisted living facility to make it up to our office, and the earliest opportunity she will have to pick up both items is next Tuesday, December 28. Assured the patient that we would hold her samples for her since we know when we can expect her. Also informed the patient that her prior authorization for the medication was being appealed and she would receive more information on that from Dr. Lysbeth Penner RN in the future. Pt verbalizes understanding and expresses her gratitude for all of the assistance from our office.

## 2020-12-17 NOTE — Telephone Encounter (Signed)
Pt c/o medication issue:  1. Name of Medication: Evolocumab (REPATHA SURECLICK) 655 MG/ML SOAJ  2. How are you currently taking this medication (dosage and times per day)? Has not taken since November   3. Are you having a reaction (difficulty breathing--STAT)? no   4. What is your medication issue? Patient states she has not taken the medication since November since she cannot afford it. She states she would like to know about getting patient assistance for the medication. She also states samples of the medication was left at the front desk for her, but she is not able to come and pick it up. Please advise.

## 2020-12-18 ENCOUNTER — Ambulatory Visit (HOSPITAL_COMMUNITY)
Admission: RE | Admit: 2020-12-18 | Discharge: 2020-12-18 | Disposition: A | Discharge status: Home / Self Care | Payer: Medicare Other | Source: Ambulatory Visit | Attending: Internal Medicine | Admitting: Internal Medicine

## 2020-12-18 ENCOUNTER — Encounter (HOSPITAL_COMMUNITY): Payer: Self-pay | Admitting: Internal Medicine

## 2020-12-18 ENCOUNTER — Other Ambulatory Visit: Payer: Self-pay

## 2020-12-18 VITALS — BP 160/88 | HR 87 | Wt 163.4 lb

## 2020-12-18 DIAGNOSIS — I480 Paroxysmal atrial fibrillation: Secondary | ICD-10-CM | POA: Diagnosis not present

## 2020-12-18 DIAGNOSIS — I739 Peripheral vascular disease, unspecified: Secondary | ICD-10-CM | POA: Diagnosis not present

## 2020-12-18 DIAGNOSIS — I110 Hypertensive heart disease with heart failure: Secondary | ICD-10-CM | POA: Diagnosis not present

## 2020-12-18 DIAGNOSIS — Z794 Long term (current) use of insulin: Secondary | ICD-10-CM | POA: Diagnosis not present

## 2020-12-18 DIAGNOSIS — Z7902 Long term (current) use of antithrombotics/antiplatelets: Secondary | ICD-10-CM | POA: Insufficient documentation

## 2020-12-18 DIAGNOSIS — I10 Essential (primary) hypertension: Secondary | ICD-10-CM | POA: Diagnosis not present

## 2020-12-18 DIAGNOSIS — I272 Pulmonary hypertension, unspecified: Secondary | ICD-10-CM | POA: Insufficient documentation

## 2020-12-18 DIAGNOSIS — G4733 Obstructive sleep apnea (adult) (pediatric): Secondary | ICD-10-CM | POA: Insufficient documentation

## 2020-12-18 DIAGNOSIS — I2721 Secondary pulmonary arterial hypertension: Secondary | ICD-10-CM | POA: Diagnosis not present

## 2020-12-18 DIAGNOSIS — R0602 Shortness of breath: Secondary | ICD-10-CM | POA: Insufficient documentation

## 2020-12-18 DIAGNOSIS — Z87891 Personal history of nicotine dependence: Secondary | ICD-10-CM | POA: Diagnosis not present

## 2020-12-18 DIAGNOSIS — I5032 Chronic diastolic (congestive) heart failure: Secondary | ICD-10-CM

## 2020-12-18 DIAGNOSIS — Z8249 Family history of ischemic heart disease and other diseases of the circulatory system: Secondary | ICD-10-CM | POA: Insufficient documentation

## 2020-12-18 DIAGNOSIS — Z79899 Other long term (current) drug therapy: Secondary | ICD-10-CM | POA: Diagnosis not present

## 2020-12-18 DIAGNOSIS — I5042 Chronic combined systolic (congestive) and diastolic (congestive) heart failure: Secondary | ICD-10-CM | POA: Diagnosis not present

## 2020-12-18 DIAGNOSIS — Z7901 Long term (current) use of anticoagulants: Secondary | ICD-10-CM | POA: Diagnosis not present

## 2020-12-18 DIAGNOSIS — I252 Old myocardial infarction: Secondary | ICD-10-CM | POA: Diagnosis not present

## 2020-12-18 DIAGNOSIS — J441 Chronic obstructive pulmonary disease with (acute) exacerbation: Secondary | ICD-10-CM | POA: Insufficient documentation

## 2020-12-18 DIAGNOSIS — I2510 Atherosclerotic heart disease of native coronary artery without angina pectoris: Secondary | ICD-10-CM

## 2020-12-18 DIAGNOSIS — I4892 Unspecified atrial flutter: Secondary | ICD-10-CM | POA: Insufficient documentation

## 2020-12-18 DIAGNOSIS — E119 Type 2 diabetes mellitus without complications: Secondary | ICD-10-CM | POA: Diagnosis not present

## 2020-12-18 LAB — COMPREHENSIVE METABOLIC PANEL
ALT: 26 U/L (ref 0–44)
AST: 30 U/L (ref 15–41)
Albumin: 3.6 g/dL (ref 3.5–5.0)
Alkaline Phosphatase: 87 U/L (ref 38–126)
Anion gap: 13 (ref 5–15)
BUN: 23 mg/dL (ref 8–23)
CO2: 28 mmol/L (ref 22–32)
Calcium: 10.1 mg/dL (ref 8.9–10.3)
Chloride: 97 mmol/L — ABNORMAL LOW (ref 98–111)
Creatinine, Ser: 1.07 mg/dL — ABNORMAL HIGH (ref 0.44–1.00)
GFR, Estimated: 50 mL/min — ABNORMAL LOW (ref >60)
Glucose, Bld: 233 mg/dL — ABNORMAL HIGH (ref 70–99)
Potassium: 4.2 mmol/L (ref 3.5–5.1)
Sodium: 138 mmol/L (ref 135–145)
Total Bilirubin: 1.2 mg/dL (ref 0.3–1.2)
Total Protein: 7.7 g/dL (ref 6.5–8.1)

## 2020-12-18 LAB — CBC
HCT: 52.3 % — ABNORMAL HIGH (ref 36.0–46.0)
Hemoglobin: 16.9 g/dL — ABNORMAL HIGH (ref 12.0–15.0)
MCH: 30.2 pg (ref 26.0–34.0)
MCHC: 32.3 g/dL (ref 30.0–36.0)
MCV: 93.4 fL (ref 80.0–100.0)
Platelets: 209 10*3/uL (ref 150–400)
RBC: 5.6 MIL/uL — ABNORMAL HIGH (ref 3.87–5.11)
RDW: 13.6 % (ref 11.5–15.5)
WBC: 5.1 10*3/uL (ref 4.0–10.5)
nRBC: 0 % (ref 0.0–0.2)

## 2020-12-18 LAB — BRAIN NATRIURETIC PEPTIDE: B Natriuretic Peptide: 134 pg/mL — ABNORMAL HIGH (ref 0.0–100.0)

## 2020-12-18 MED ORDER — AMLODIPINE BESYLATE 10 MG PO TABS
10.0000 mg | ORAL_TABLET | Freq: Every day | ORAL | 3 refills | Status: DC
Start: 2020-12-18 — End: 2021-02-19

## 2020-12-18 NOTE — Patient Instructions (Addendum)
INCREASE Amlodipine 10mg  (1 tablet) Daily  Labs done today, your results will be available in MyChart, we will contact you for abnormal readings.  Your physician has requested that you regularly monitor and record your blood pressure readings at home. Please use the same machine at the same time of day to check your readings and record them to bring to your follow-up visit.  Your physician recommends that you schedule a follow-up appointment in: 2 months  If you have any questions or concerns before your next appointment please send [place] a message through [place] or call our office at [phone].    TO LEAVE A MESSAGE FOR THE NURSE SELECT OPTION 2, PLEASE LEAVE A MESSAGE INCLUDING: . YOUR NAME . DATE OF BIRTH . CALL BACK NUMBER . REASON FOR CALL**this is important as we prioritize the call backs  YOU WILL RECEIVE A CALL BACK THE SAME DAY AS LONG AS YOU CALL BEFORE 4:00 PM

## 2020-12-18 NOTE — Addendum Note (Signed)
Encounter addended by: Malena Edman, RN on: 12/18/2020 12:11 PM  Actions taken: Visit diagnoses modified, Order list changed, Diagnosis association updated, Charge Capture section accepted, Clinical Note Signed

## 2020-12-18 NOTE — Telephone Encounter (Signed)
Thanks Sarah.  Dr Lemmie Evens

## 2020-12-18 NOTE — Progress Notes (Signed)
Medication Samples have been provided to the patient.  Drug name: Eliquis       Strength: 5mg         Qty: 44  LOT: [id number]  Exp.Date: 8/23  Dosing instructions: Take 1 tablet Twice daily   The patient has been instructed regarding the correct time, dose, and frequency of taking this medication, including desired effects and most common side effects.   [name] 12:34 PM 12/18/2020

## 2020-12-18 NOTE — Addendum Note (Signed)
Encounter addended by: Stanford Scotland, RN on: 12/18/2020 12:37 PM  Actions taken: Order Reconciliation Section accessed, Clinical Note Signed

## 2020-12-18 NOTE — Progress Notes (Signed)
Advanced [facility] Failure Clinic Note   Date:  12/18/2020   ID:  [name], DOB [birth year], MRN [id number]  Location: Home  Provider location: [facility] Type of Visit: Established patient  PCP:  [name], MD  Cardiologist:  Pixie Casino, MD Primary HF: Hollee Fate  Chief Complaint: Heart Failure follow-up   History of Present Illness:  [name] is a 86 y.o. female with CAD s/p 1v stent, HTN, atrial fibrillation/A flutter and chronic prior systolic heart failure, (which was likely rate related) and diastolic heart failure. She was referred by Dr. Lovena Le for further evaluation of Pulmonary HTN.   Underwent 2D echo in 3/15 which showed EF 60-65% mild LVH. RV was normal. No significant valvular abnormalities.   Underwent R/L cath 05/07/14 Which showed stable CAD and significant PH with normal PVR.   In 4/21 saw Dr. Scot Dock and had LE angio with severe PAD underwent stenting of 70% R external iliac  Admitted 05/21/17-05/27/17 with COPD exacerbation, acute on chronic diastolic CHF. Diuresed 5L with IV Lasix. Discharge weight 173 pounds.  Myoview 9/20   Nuclear stress EF: 56%.  There was no ST segment deviation noted during stress.  Defect 1: There is a medium defect of moderate severity present in the mid inferolateral, apical inferior and apical lateral location.  Findings consistent with possible mild ischemia in the apical inferior and inferolateral regions.  The left ventricular ejection fraction is normal (55-65%).    Admitted in 5/21 with NSTEMI hstrop peak 1,455. Echo 5/21: EF 50-55%  Cath 05/16/20 with  1. Severe 3 vessel obstructive CAD 2. Severe pulmonary HTN 3. Successful PCI of the proximal LCx/OM1 with DES x 1 with IVUS guidance 4. Successful PCI of the proximal LAD with orbital atherectomy and DES x 1  Ao = 172/68 (108) LV = 166/10 RA = 6 RV = 75/6 PA = 77/26 (45) PCW = 10 Fick cardiac output/index  = 4.0/2.2 PVR = 8.8 WU FA sat = 99% PA sat = 67%, 73%  She presents today for routine f/u. She is now living at [facility]. Has completed CR. Now going to PT 3x/week. Says she doesn't feel well. More SOB. + pedal edema. Denies CP. Has increased lasix from 80 bid to 160/80 over past month. Peeing a lot but still swollen. Drinking lots of water because she says she is so thirsty. No orthopnea or PND. Wearing CPAP religiously. Out of with mild exertion. Weight stable.    Past Medical History:  Diagnosis Date  . Anemia    iron defficiency  . Anginal pain ([name])   . Arthritis    "knees, feet, hands; joints" (05/27/2015)  . Asthma   . Atrial fibrillation or flutter    s/p RFCA 7/08;   s/p DCCV in past;   previously on amiodarone;  amio stopped due to lung toxicity  . CAD (coronary artery disease)    s/p NSTEMI tx with BMS to OM1 3/08;  cath 3/08: pOM 99% tx with PCI, pLAD 20%, ? mod stenosis at the AM  . CAP (community acquired pneumonia) 05/24/2015  . CHF (congestive heart failure) ([place])   . Chronic diastolic heart failure (HCC)    echo 11/11:  EF 55-60%, severe LVH, mod LAE, mild MR, mildly increased PASP  . COPD (chronic obstructive pulmonary disease) ([place])   . Degenerative joint disease   . Dystrophy, corneal stromal   . Heart murmur   . HLD (hyperlipidemia)   .  HTN (hypertension)    essential nos  . Hypopotassemia    PMH of  . Muscle pain   . Myocardial infarction ([name]) 2008  . OSA on CPAP   . Osteoporosis   . Pneumonia 05/27/2015  . Protein calorie malnutrition ([place])   . Rash and nonspecific skin eruption    both arms,awaiting bio   dr Delman Cheadle  . Seasonal allergies   . Shortness of breath dyspnea   . Type II diabetes mellitus ([name])    Dr Loanne Drilling   Past Surgical History:  Procedure Laterality Date  . A FLUTTER ABLATION     Dr Lovena Le  . ABDOMINAL AORTOGRAM W/LOWER EXTREMITY Bilateral 04/11/2020   Procedure: ABDOMINAL AORTOGRAM W/LOWER EXTREMITY;  Surgeon: Angelia Mould, MD;  Location: [place] [facility];  Service: Cardiovascular;  Laterality: Bilateral;  . CATARACT EXTRACTION W/ INTRAOCULAR LENS  IMPLANT, BILATERAL Bilateral   . CHOLECYSTECTOMY    . COLONOSCOPY  6/07   2 polyps Dr. Kinnie Feil in [place]  . colonoscopy with polypectomy      X 2; [facility]  . CORNEAL TRANSPLANT Bilateral   . CORONARY ANGIOPLASTY WITH STENT PLACEMENT  02/2007   BMS w/Dr Lovena Le  . CORONARY ATHERECTOMY N/A 05/16/2020   Procedure: CORONARY ATHERECTOMY;  Surgeon: Martinique, Peter M, MD;  Location: [place] [facility];  Service: Cardiovascular;  Laterality: N/A;  . CORONARY BALLOON ANGIOPLASTY N/A 05/16/2020   Procedure: CORONARY BALLOON ANGIOPLASTY;  Surgeon: Martinique, Peter M, MD;  Location: [place] [facility];  Service: Cardiovascular;  Laterality: N/A;  . CORONARY STENT INTERVENTION N/A 05/16/2020   Procedure: CORONARY STENT INTERVENTION;  Surgeon: Martinique, Peter M, MD;  Location: [place] [facility];  Service: Cardiovascular;  Laterality: N/A;  . EYE SURGERY    . gravid 2 para 2    . INTRAVASCULAR ULTRASOUND/IVUS N/A 05/16/2020   Procedure: Intravascular Ultrasound/IVUS;  Surgeon: Martinique, Peter M, MD;  Location: [place] [facility];  Service: Cardiovascular;  Laterality: N/A;  . KNEE CARTILAGE SURGERY Right   . LEFT AND RIGHT HEART CATHETERIZATION WITH CORONARY ANGIOGRAM N/A 05/07/2014   Procedure: LEFT AND RIGHT HEART CATHETERIZATION WITH CORONARY ANGIOGRAM;  Surgeon: Peter M Martinique, MD;  Location: [facility] LAB;  Service: Cardiovascular;  Laterality: N/A;  . PERIPHERAL VASCULAR INTERVENTION  04/11/2020   Procedure: PERIPHERAL VASCULAR INTERVENTION;  Surgeon: Angelia Mould, MD;  Location: [place] [facility];  Service: Cardiovascular;;  . RIGHT/LEFT HEART CATH AND CORONARY ANGIOGRAPHY N/A 05/16/2020   Procedure: RIGHT/LEFT HEART CATH AND CORONARY ANGIOGRAPHY;  Surgeon: Jolaine Artist, MD;  Location: [place] [facility];  Service: Cardiovascular;  Laterality: N/A;  . TOTAL  KNEE ARTHROPLASTY Right 06/28/2016   Procedure: TOTAL KNEE ARTHROPLASTY;  Surgeon: Frederik Pear, MD;  Location: [place];  Service: Orthopedics;  Laterality: Right;     Current Outpatient Medications  Medication Sig Dispense Refill  . acetaminophen (TYLENOL) 500 MG tablet Take 1,000 mg by mouth every 6 (six) hours as needed (pain).    Marland Kitchen amLODipine (NORVASC) 5 MG tablet Take 1 tablet (5 mg total) by mouth daily. 30 tablet 6  . CALCIUM-MAG-VIT C-VIT D PO Take 1 tablet by mouth daily.     . carvedilol (COREG) 6.25 MG tablet Take 6.25 mg by mouth 2 (two) times daily with a meal.    . cholecalciferol (VITAMIN D) 1000 units tablet Take 1,000 Units by mouth daily after supper.    . clopidogrel (PLAVIX) 75 MG tablet TAKE 1 TABLET BY MOUTH ONCE  DAILY WITH BREAKFAST 90 tablet 3  . ELIQUIS 5 MG TABS tablet TAKE 1 TABLET BY MOUTH  TWICE DAILY 60 tablet 11  . empagliflozin (JARDIANCE) 10 MG TABS tablet Take 10 mg by mouth daily before breakfast. 90 tablet 3  . Evolocumab (REPATHA SURECLICK) [id number] MG/ML SOAJ Inject 1 Dose into the skin every 14 (fourteen) days. 2 pen 11  . ezetimibe (ZETIA) 10 MG tablet Take 1 tablet (10 mg total) by mouth daily. 30 tablet 10  . furosemide (LASIX) 80 MG tablet TAKE 1 TABLET BY MOUTH TWO  TIMES DAILY 180 tablet 3  . glucose blood (ONETOUCH VERIO) test strip USE TO MONITOR GLUCOSE  LEVELS TWICE DAILY 200 strip 3  . Insulin Lispro Prot & Lispro (HUMALOG MIX 75/25 KWIKPEN) (75-25) 100 UNIT/ML Kwikpen Inject 36 units into the skin daily with breakfast 15 mL 11  . Lancets (ONETOUCH DELICA PLUS [id number]) MISC USE TO MONITOR GLUCOSE  LEVELS TWICE DAILY 200 each 3  . Multiple Vitamin (MULTIVITAMIN WITH MINERALS) TABS tablet Take 1 tablet by mouth daily. Centrum Silver    . nitroGLYCERIN (NITROSTAT) 0.4 MG SL tablet Place 1 tablet (0.4 mg total) under the tongue every 5 (five) minutes as needed for chest pain. Reported on 01/28/2016 30 tablet 3  . potassium chloride SA (KLOR-CON) 20 MEQ  tablet Take 20 mEq by mouth 2 (two) times daily.     No current facility-administered medications for this encounter.    Allergies:   Levaquin [levofloxacin], Lobster [shellfish allergy], Penicillins, Valsartan, Codeine, Lipitor [atorvastatin], Oxycodone, Statins, Tramadol, Amlodipine besylate, Crestor [rosuvastatin calcium], and Tramadol hcl   Social History:  The patient  reports that she quit smoking about 52 years ago. Her smoking use included cigarettes. She has a 4.50 pack-year smoking history. She has never used smokeless tobacco. She reports current alcohol use. She reports that she does not use drugs.   Family History:  The patient's family history includes Arthritis in her father, maternal aunt, and mother; Breast cancer in her maternal aunt; Diabetes in her mother; Heart attack (age of onset: 3) in her father; Hypertension in her father and mother; Transient ischemic attack in her mother.   ROS:  Please see the history of present illness.   All other systems are personally reviewed and negative.   Vitals:   12/18/20 1114  BP: (!) 160/88  Pulse: 87  SpO2: 92%  Weight: 74.1 kg (163 lb 6.4 oz)    Exam:   General:  Well appearing. No resp difficulty HEENT: normal Neck: supple. no JVD. Carotids 2+ bilat; no bruits. No lymphadenopathy or thryomegaly appreciated. Cor: PMI nondisplaced. Irregular rate & rhythm. No rubs, gallops or murmurs. Lungs: clear with decreased BS throughout Abdomen: soft, nontender, nondistended. No hepatosplenomegaly. No bruits or masses. Good bowel sounds. Extremities: no cyanosis, clubbing, rash, mild pedal edema Neuro: alert & orientedx3, cranial nerves grossly intact. moves all 4 extremities w/o difficulty. Affect pleasant   Recent Labs: 02/20/2020: ALT 21 05/15/2020: Magnesium 1.9; TSH 0.866 06/09/2020: B Natriuretic Peptide 225.6; BUN 23; Creatinine, Ser 1.23; Hemoglobin 15.9; Platelets 197; Potassium 4.2; Sodium 140  Personally reviewed   Wt  Readings from Last 3 Encounters:  12/18/20 74.1 kg (163 lb 6.4 oz)  12/02/20 73.8 kg (162 lb 9.6 oz)  09/24/20 76.7 kg (169 lb)    AFL 81 Personally reviewed   ASSESSMENT AND PLAN:   1) CAD - H/o of stent in 2008 - NSTEMI 5/21. Cath as above. S/p PCI to LAD & OM -  No s/s angina - On Plavix, apixaban - Unable to tolerate statins. On Repatha/zetia per [facility]id Clinic - Has completed CR.  2) PAD - severe  - s/p stenting to R external iliac with Dr. Scot Dock - Continues with mild claudication   3) PAH - Moderate to severe by cath 5/21. PVR 8.8 - suspect multifactorial  - wearing CPAP - doing very well. Once CR complete and she is on CPAP for several months will schedule another RHC when we see her back in 2 months   4) Chronic diastolic CHF:  - Echo [id number] EF 60-65%, LA severely dilated, Echo 10/2017: EF 65-70%, grade 3 DD - Echo 5/21 EF 50-55% - NYHA III  - She has increased lasix from 80 bid to 160/80. Volume status not too bad on exam and REDS 36%  Drinking too much water.  - Continue KCL 40 bid - Not on Entresto due to h/o angioedema with ARB - Check labs today   5) Paroxysmal atrial fib/flutter - remains in [facility] today. Rate controlled. Asx This patients CHA2DS2-VASc Score and unadjusted Ischemic Stroke Rate (% per year) is equal to 7.2 % stroke rate/year from a score of 5 Above score calculated as 2 points each if present [Age > 75, or Stroke/TIA/TE] - Continue Eliquis. No bleeding  6) OSA/COPD - Follows with Dr. Halford Chessman.  - Compliant with CPAP  7) HTN - Blood pressure elevated. Increase amlodipine to 10 mg daily. Keep BP log. And bring to next visit. - Can add doxasozin as needed   8) DM2 - Continue Jardiance 5mg  daily (unable to tolerate 10mg  daily) - No change  9) HL  - Unable to tolerate statins. On Repatha/Zetia - Follows with Lipid Clinic - no change   Signed, Glori Bickers, MD  12/18/2020 11:38 AM  Advanced [facility] Failure [address] [place] [place] [address] [phone] (office) [phone] (fax)

## 2020-12-18 NOTE — Progress Notes (Signed)
ReDS Vest / Clip - 12/18/20 1100      ReDS Vest / Clip   Station Marker A    Ruler Value 24    ReDS Value Range Moderate volume overload    ReDS Actual Value 36

## 2020-12-21 ENCOUNTER — Other Ambulatory Visit (HOSPITAL_COMMUNITY): Payer: Self-pay | Admitting: Internal Medicine

## 2020-12-22 DIAGNOSIS — M6281 Muscle weakness (generalized): Secondary | ICD-10-CM | POA: Diagnosis not present

## 2020-12-22 DIAGNOSIS — R2689 Other abnormalities of gait and mobility: Secondary | ICD-10-CM | POA: Diagnosis not present

## 2020-12-24 NOTE — Telephone Encounter (Signed)
repatha approved until 12/26/21 per [employer] rep

## 2020-12-24 NOTE — Telephone Encounter (Signed)
Spoke with [employer] rep - original denial has been overturned and medication approved until 12/26/2021

## 2020-12-25 MED ORDER — REPATHA SURECLICK 140 MG/ML ~~LOC~~ SOAJ: 1.0000 | SUBCUTANEOUS | 5 refills | Status: DC

## 2020-12-25 NOTE — Addendum Note (Signed)
Addended by: Lindell Spar on: 12/25/2020 07:50 AM   Modules accepted: Orders

## 2020-12-29 ENCOUNTER — Telehealth: Payer: Self-pay | Admitting: Internal Medicine

## 2020-12-29 DIAGNOSIS — M6281 Muscle weakness (generalized): Secondary | ICD-10-CM | POA: Diagnosis not present

## 2020-12-29 DIAGNOSIS — R2689 Other abnormalities of gait and mobility: Secondary | ICD-10-CM | POA: Diagnosis not present

## 2020-12-29 NOTE — Telephone Encounter (Signed)
[facility]   Caller states she has been having a little SOB, coughing, temp 99.5 oral Hx of CHF  [facility] transferred to On Call provider.   Advised the patient to monitor symptoms and follow up PRN, but if having SOB she should be seen in ED to be evaluated.

## 2020-12-30 ENCOUNTER — Encounter (HOSPITAL_COMMUNITY): Payer: Self-pay

## 2020-12-30 NOTE — Telephone Encounter (Signed)
Everything ok. Stable

## 2020-12-30 NOTE — Telephone Encounter (Signed)
Received patient assistance application from patient. Faxed to [employer] @ [phone]

## 2020-12-31 DIAGNOSIS — M6281 Muscle weakness (generalized): Secondary | ICD-10-CM | POA: Diagnosis not present

## 2020-12-31 DIAGNOSIS — R2689 Other abnormalities of gait and mobility: Secondary | ICD-10-CM | POA: Diagnosis not present

## 2021-01-02 DIAGNOSIS — R2689 Other abnormalities of gait and mobility: Secondary | ICD-10-CM | POA: Diagnosis not present

## 2021-01-02 DIAGNOSIS — M6281 Muscle weakness (generalized): Secondary | ICD-10-CM | POA: Diagnosis not present

## 2021-01-05 DIAGNOSIS — R2689 Other abnormalities of gait and mobility: Secondary | ICD-10-CM | POA: Diagnosis not present

## 2021-01-05 DIAGNOSIS — M6281 Muscle weakness (generalized): Secondary | ICD-10-CM | POA: Diagnosis not present

## 2021-01-05 NOTE — Telephone Encounter (Signed)
Spoke with patient about medication approval from insurance. During call, checked [employer] and found out their enrollment is open. Provided her w/phone # to call and explained this can be done online also

## 2021-01-07 DIAGNOSIS — R2689 Other abnormalities of gait and mobility: Secondary | ICD-10-CM | POA: Diagnosis not present

## 2021-01-07 DIAGNOSIS — M6281 Muscle weakness (generalized): Secondary | ICD-10-CM | POA: Diagnosis not present

## 2021-01-12 DIAGNOSIS — M6281 Muscle weakness (generalized): Secondary | ICD-10-CM | POA: Diagnosis not present

## 2021-01-12 DIAGNOSIS — R2689 Other abnormalities of gait and mobility: Secondary | ICD-10-CM | POA: Diagnosis not present

## 2021-01-14 DIAGNOSIS — R2689 Other abnormalities of gait and mobility: Secondary | ICD-10-CM | POA: Diagnosis not present

## 2021-01-14 DIAGNOSIS — G4733 Obstructive sleep apnea (adult) (pediatric): Secondary | ICD-10-CM | POA: Diagnosis not present

## 2021-01-14 DIAGNOSIS — M6281 Muscle weakness (generalized): Secondary | ICD-10-CM | POA: Diagnosis not present

## 2021-01-16 DIAGNOSIS — R2689 Other abnormalities of gait and mobility: Secondary | ICD-10-CM | POA: Diagnosis not present

## 2021-01-16 DIAGNOSIS — M6281 Muscle weakness (generalized): Secondary | ICD-10-CM | POA: Diagnosis not present

## 2021-01-23 ENCOUNTER — Encounter (HOSPITAL_COMMUNITY): Payer: Self-pay

## 2021-01-26 NOTE — Telephone Encounter (Signed)
She is plavix for her recent stents and Eliquis for her AF. We wil lstop Plavix one year after her stents were placed (in May)

## 2021-01-30 ENCOUNTER — Encounter (HOSPITAL_COMMUNITY): Payer: Self-pay

## 2021-01-30 ENCOUNTER — Inpatient Hospital Stay (HOSPITAL_COMMUNITY)
Admission: EM | Admit: 2021-01-30 | Discharge: 2021-02-05 | DRG: 291 | Disposition: A | Discharge status: Home / Self Care | Payer: Medicare Other | Attending: Internal Medicine | Admitting: Internal Medicine

## 2021-01-30 ENCOUNTER — Emergency Department (HOSPITAL_COMMUNITY): Payer: Medicare Other

## 2021-01-30 ENCOUNTER — Other Ambulatory Visit: Payer: Self-pay

## 2021-01-30 DIAGNOSIS — E663 Overweight: Secondary | ICD-10-CM | POA: Diagnosis present

## 2021-01-30 DIAGNOSIS — M19042 Primary osteoarthritis, left hand: Secondary | ICD-10-CM | POA: Diagnosis present

## 2021-01-30 DIAGNOSIS — D6859 Other primary thrombophilia: Secondary | ICD-10-CM | POA: Diagnosis not present

## 2021-01-30 DIAGNOSIS — M170 Bilateral primary osteoarthritis of knee: Secondary | ICD-10-CM | POA: Diagnosis not present

## 2021-01-30 DIAGNOSIS — Z833 Family history of diabetes mellitus: Secondary | ICD-10-CM

## 2021-01-30 DIAGNOSIS — J90 Pleural effusion, not elsewhere classified: Secondary | ICD-10-CM | POA: Diagnosis not present

## 2021-01-30 DIAGNOSIS — Z880 Allergy status to penicillin: Secondary | ICD-10-CM

## 2021-01-30 DIAGNOSIS — R778 Other specified abnormalities of plasma proteins: Secondary | ICD-10-CM

## 2021-01-30 DIAGNOSIS — Z7984 Long term (current) use of oral hypoglycemic drugs: Secondary | ICD-10-CM

## 2021-01-30 DIAGNOSIS — Z743 Need for continuous supervision: Secondary | ICD-10-CM | POA: Diagnosis not present

## 2021-01-30 DIAGNOSIS — Z66 Do not resuscitate: Secondary | ICD-10-CM | POA: Diagnosis not present

## 2021-01-30 DIAGNOSIS — I130 Hypertensive heart and chronic kidney disease with heart failure and stage 1 through stage 4 chronic kidney disease, or unspecified chronic kidney disease: Principal | ICD-10-CM | POA: Diagnosis present

## 2021-01-30 DIAGNOSIS — I739 Peripheral vascular disease, unspecified: Secondary | ICD-10-CM | POA: Diagnosis not present

## 2021-01-30 DIAGNOSIS — N1831 Chronic kidney disease, stage 3a: Secondary | ICD-10-CM | POA: Diagnosis present

## 2021-01-30 DIAGNOSIS — I2729 Other secondary pulmonary hypertension: Secondary | ICD-10-CM | POA: Diagnosis present

## 2021-01-30 DIAGNOSIS — E1169 Type 2 diabetes mellitus with other specified complication: Secondary | ICD-10-CM | POA: Diagnosis not present

## 2021-01-30 DIAGNOSIS — G4733 Obstructive sleep apnea (adult) (pediatric): Secondary | ICD-10-CM | POA: Diagnosis not present

## 2021-01-30 DIAGNOSIS — I4892 Unspecified atrial flutter: Secondary | ICD-10-CM | POA: Diagnosis not present

## 2021-01-30 DIAGNOSIS — K449 Diaphragmatic hernia without obstruction or gangrene: Secondary | ICD-10-CM | POA: Diagnosis not present

## 2021-01-30 DIAGNOSIS — I517 Cardiomegaly: Secondary | ICD-10-CM | POA: Diagnosis not present

## 2021-01-30 DIAGNOSIS — R0902 Hypoxemia: Secondary | ICD-10-CM | POA: Diagnosis not present

## 2021-01-30 DIAGNOSIS — J9811 Atelectasis: Secondary | ICD-10-CM | POA: Diagnosis not present

## 2021-01-30 DIAGNOSIS — I5032 Chronic diastolic (congestive) heart failure: Secondary | ICD-10-CM | POA: Diagnosis not present

## 2021-01-30 DIAGNOSIS — J9602 Acute respiratory failure with hypercapnia: Secondary | ICD-10-CM | POA: Diagnosis not present

## 2021-01-30 DIAGNOSIS — E1151 Type 2 diabetes mellitus with diabetic peripheral angiopathy without gangrene: Secondary | ICD-10-CM | POA: Diagnosis not present

## 2021-01-30 DIAGNOSIS — I272 Pulmonary hypertension, unspecified: Secondary | ICD-10-CM | POA: Diagnosis present

## 2021-01-30 DIAGNOSIS — Z8249 Family history of ischemic heart disease and other diseases of the circulatory system: Secondary | ICD-10-CM

## 2021-01-30 DIAGNOSIS — I480 Paroxysmal atrial fibrillation: Secondary | ICD-10-CM | POA: Diagnosis not present

## 2021-01-30 DIAGNOSIS — Z881 Allergy status to other antibiotic agents status: Secondary | ICD-10-CM

## 2021-01-30 DIAGNOSIS — I5033 Acute on chronic diastolic (congestive) heart failure: Secondary | ICD-10-CM

## 2021-01-30 DIAGNOSIS — R0689 Other abnormalities of breathing: Secondary | ICD-10-CM | POA: Diagnosis not present

## 2021-01-30 DIAGNOSIS — Z888 Allergy status to other drugs, medicaments and biological substances status: Secondary | ICD-10-CM

## 2021-01-30 DIAGNOSIS — J9601 Acute respiratory failure with hypoxia: Secondary | ICD-10-CM | POA: Diagnosis present

## 2021-01-30 DIAGNOSIS — R0602 Shortness of breath: Secondary | ICD-10-CM | POA: Diagnosis not present

## 2021-01-30 DIAGNOSIS — I2721 Secondary pulmonary arterial hypertension: Secondary | ICD-10-CM | POA: Diagnosis not present

## 2021-01-30 DIAGNOSIS — I25119 Atherosclerotic heart disease of native coronary artery with unspecified angina pectoris: Secondary | ICD-10-CM | POA: Diagnosis not present

## 2021-01-30 DIAGNOSIS — I4811 Longstanding persistent atrial fibrillation: Secondary | ICD-10-CM | POA: Diagnosis not present

## 2021-01-30 DIAGNOSIS — E1122 Type 2 diabetes mellitus with diabetic chronic kidney disease: Secondary | ICD-10-CM | POA: Diagnosis not present

## 2021-01-30 DIAGNOSIS — I5043 Acute on chronic combined systolic (congestive) and diastolic (congestive) heart failure: Secondary | ICD-10-CM | POA: Diagnosis not present

## 2021-01-30 DIAGNOSIS — Z9989 Dependence on other enabling machines and devices: Secondary | ICD-10-CM

## 2021-01-30 DIAGNOSIS — I4819 Other persistent atrial fibrillation: Secondary | ICD-10-CM | POA: Diagnosis present

## 2021-01-30 DIAGNOSIS — Z794 Long term (current) use of insulin: Secondary | ICD-10-CM

## 2021-01-30 DIAGNOSIS — M461 Sacroiliitis, not elsewhere classified: Secondary | ICD-10-CM | POA: Diagnosis present

## 2021-01-30 DIAGNOSIS — Z955 Presence of coronary angioplasty implant and graft: Secondary | ICD-10-CM

## 2021-01-30 DIAGNOSIS — I10 Essential (primary) hypertension: Secondary | ICD-10-CM | POA: Diagnosis not present

## 2021-01-30 DIAGNOSIS — M810 Age-related osteoporosis without current pathological fracture: Secondary | ICD-10-CM | POA: Diagnosis not present

## 2021-01-30 DIAGNOSIS — R6889 Other general symptoms and signs: Secondary | ICD-10-CM | POA: Diagnosis not present

## 2021-01-30 DIAGNOSIS — M19041 Primary osteoarthritis, right hand: Secondary | ICD-10-CM | POA: Diagnosis present

## 2021-01-30 DIAGNOSIS — E0590 Thyrotoxicosis, unspecified without thyrotoxic crisis or storm: Secondary | ICD-10-CM | POA: Diagnosis not present

## 2021-01-30 DIAGNOSIS — Z6828 Body mass index (BMI) 28.0-28.9, adult: Secondary | ICD-10-CM

## 2021-01-30 DIAGNOSIS — Z20822 Contact with and (suspected) exposure to covid-19: Secondary | ICD-10-CM | POA: Diagnosis not present

## 2021-01-30 DIAGNOSIS — Z7901 Long term (current) use of anticoagulants: Secondary | ICD-10-CM

## 2021-01-30 DIAGNOSIS — Z79899 Other long term (current) drug therapy: Secondary | ICD-10-CM

## 2021-01-30 DIAGNOSIS — R7989 Other specified abnormal findings of blood chemistry: Secondary | ICD-10-CM

## 2021-01-30 DIAGNOSIS — Z91013 Allergy to seafood: Secondary | ICD-10-CM

## 2021-01-30 DIAGNOSIS — N189 Chronic kidney disease, unspecified: Secondary | ICD-10-CM | POA: Diagnosis not present

## 2021-01-30 DIAGNOSIS — E782 Mixed hyperlipidemia: Secondary | ICD-10-CM | POA: Diagnosis present

## 2021-01-30 DIAGNOSIS — I483 Typical atrial flutter: Secondary | ICD-10-CM | POA: Diagnosis not present

## 2021-01-30 DIAGNOSIS — I110 Hypertensive heart disease with heart failure: Secondary | ICD-10-CM | POA: Diagnosis not present

## 2021-01-30 DIAGNOSIS — M8580 Other specified disorders of bone density and structure, unspecified site: Secondary | ICD-10-CM | POA: Diagnosis present

## 2021-01-30 DIAGNOSIS — I2510 Atherosclerotic heart disease of native coronary artery without angina pectoris: Secondary | ICD-10-CM | POA: Diagnosis present

## 2021-01-30 DIAGNOSIS — E785 Hyperlipidemia, unspecified: Secondary | ICD-10-CM | POA: Diagnosis not present

## 2021-01-30 DIAGNOSIS — Z8701 Personal history of pneumonia (recurrent): Secondary | ICD-10-CM

## 2021-01-30 DIAGNOSIS — J449 Chronic obstructive pulmonary disease, unspecified: Secondary | ICD-10-CM | POA: Diagnosis not present

## 2021-01-30 DIAGNOSIS — Z87891 Personal history of nicotine dependence: Secondary | ICD-10-CM

## 2021-01-30 DIAGNOSIS — I5023 Acute on chronic systolic (congestive) heart failure: Secondary | ICD-10-CM | POA: Diagnosis not present

## 2021-01-30 DIAGNOSIS — I4891 Unspecified atrial fibrillation: Secondary | ICD-10-CM | POA: Diagnosis present

## 2021-01-30 DIAGNOSIS — I509 Heart failure, unspecified: Secondary | ICD-10-CM | POA: Diagnosis not present

## 2021-01-30 DIAGNOSIS — Z7902 Long term (current) use of antithrombotics/antiplatelets: Secondary | ICD-10-CM

## 2021-01-30 DIAGNOSIS — R609 Edema, unspecified: Secondary | ICD-10-CM | POA: Diagnosis not present

## 2021-01-30 DIAGNOSIS — Z885 Allergy status to narcotic agent status: Secondary | ICD-10-CM

## 2021-01-30 DIAGNOSIS — Z8261 Family history of arthritis: Secondary | ICD-10-CM

## 2021-01-30 DIAGNOSIS — N179 Acute kidney failure, unspecified: Secondary | ICD-10-CM | POA: Diagnosis present

## 2021-01-30 DIAGNOSIS — I252 Old myocardial infarction: Secondary | ICD-10-CM

## 2021-01-30 DIAGNOSIS — Z947 Corneal transplant status: Secondary | ICD-10-CM

## 2021-01-30 DIAGNOSIS — E1159 Type 2 diabetes mellitus with other circulatory complications: Secondary | ICD-10-CM | POA: Diagnosis present

## 2021-01-30 LAB — CBC
HCT: 51.5 % — ABNORMAL HIGH (ref 36.0–46.0)
Hemoglobin: 15.7 g/dL — ABNORMAL HIGH (ref 12.0–15.0)
MCH: 29.9 pg (ref 26.0–34.0)
MCHC: 30.5 g/dL (ref 30.0–36.0)
MCV: 98.1 fL (ref 80.0–100.0)
Platelets: 174 10*3/uL (ref 150–400)
RBC: 5.25 MIL/uL — ABNORMAL HIGH (ref 3.87–5.11)
RDW: 14.6 % (ref 11.5–15.5)
WBC: 4.9 10*3/uL (ref 4.0–10.5)
nRBC: 0 % (ref 0.0–0.2)

## 2021-01-30 LAB — BASIC METABOLIC PANEL
Anion gap: 8 (ref 5–15)
BUN: 19 mg/dL (ref 8–23)
CO2: 29 mmol/L (ref 22–32)
Calcium: 9.8 mg/dL (ref 8.9–10.3)
Chloride: 104 mmol/L (ref 98–111)
Creatinine, Ser: 1.01 mg/dL — ABNORMAL HIGH (ref 0.44–1.00)
GFR, Estimated: 54 mL/min — ABNORMAL LOW (ref >60)
Glucose, Bld: 165 mg/dL — ABNORMAL HIGH (ref 70–99)
Potassium: 4.6 mmol/L (ref 3.5–5.1)
Sodium: 141 mmol/L (ref 135–145)

## 2021-01-30 LAB — CBG MONITORING, ED
Glucose-Capillary: 63 mg/dL — ABNORMAL LOW (ref 70–99)
Glucose-Capillary: 126 mg/dL — ABNORMAL HIGH (ref 70–99)

## 2021-01-30 LAB — BRAIN NATRIURETIC PEPTIDE: B Natriuretic Peptide: 171.8 pg/mL — ABNORMAL HIGH (ref 0.0–100.0)

## 2021-01-30 NOTE — ED Notes (Signed)
Ok to give crackers and juice for CBG 63

## 2021-01-30 NOTE — ED Triage Notes (Signed)
Pt from [place] with ems for SOB on exertion, 78% on room air initially. NRB applied pt at 99%. Bilateral ankle swelling, hx of CHF. Pt on 3L [place] upon arrival to ED, pt at 96-97%. Does not wear oxygen at home, just CPAP at night. Facility reports covid outbreak with staff.

## 2021-01-31 ENCOUNTER — Inpatient Hospital Stay (HOSPITAL_COMMUNITY): Payer: Medicare Other

## 2021-01-31 ENCOUNTER — Encounter (HOSPITAL_COMMUNITY): Payer: Self-pay | Admitting: Internal Medicine

## 2021-01-31 ENCOUNTER — Emergency Department (HOSPITAL_COMMUNITY): Payer: Medicare Other

## 2021-01-31 DIAGNOSIS — J9602 Acute respiratory failure with hypercapnia: Secondary | ICD-10-CM | POA: Diagnosis present

## 2021-01-31 DIAGNOSIS — N189 Chronic kidney disease, unspecified: Secondary | ICD-10-CM | POA: Diagnosis present

## 2021-01-31 DIAGNOSIS — E1159 Type 2 diabetes mellitus with other circulatory complications: Secondary | ICD-10-CM

## 2021-01-31 DIAGNOSIS — J449 Chronic obstructive pulmonary disease, unspecified: Secondary | ICD-10-CM | POA: Diagnosis present

## 2021-01-31 DIAGNOSIS — I4892 Unspecified atrial flutter: Secondary | ICD-10-CM | POA: Diagnosis present

## 2021-01-31 DIAGNOSIS — G4733 Obstructive sleep apnea (adult) (pediatric): Secondary | ICD-10-CM | POA: Diagnosis present

## 2021-01-31 DIAGNOSIS — I2510 Atherosclerotic heart disease of native coronary artery without angina pectoris: Secondary | ICD-10-CM | POA: Diagnosis present

## 2021-01-31 DIAGNOSIS — I483 Typical atrial flutter: Secondary | ICD-10-CM | POA: Diagnosis not present

## 2021-01-31 DIAGNOSIS — I5043 Acute on chronic combined systolic (congestive) and diastolic (congestive) heart failure: Secondary | ICD-10-CM | POA: Diagnosis present

## 2021-01-31 DIAGNOSIS — Z66 Do not resuscitate: Secondary | ICD-10-CM | POA: Diagnosis present

## 2021-01-31 DIAGNOSIS — I2729 Other secondary pulmonary hypertension: Secondary | ICD-10-CM | POA: Diagnosis present

## 2021-01-31 DIAGNOSIS — I509 Heart failure, unspecified: Secondary | ICD-10-CM | POA: Diagnosis not present

## 2021-01-31 DIAGNOSIS — M810 Age-related osteoporosis without current pathological fracture: Secondary | ICD-10-CM | POA: Diagnosis present

## 2021-01-31 DIAGNOSIS — E1169 Type 2 diabetes mellitus with other specified complication: Secondary | ICD-10-CM

## 2021-01-31 DIAGNOSIS — I272 Pulmonary hypertension, unspecified: Secondary | ICD-10-CM | POA: Diagnosis not present

## 2021-01-31 DIAGNOSIS — E1122 Type 2 diabetes mellitus with diabetic chronic kidney disease: Secondary | ICD-10-CM | POA: Diagnosis present

## 2021-01-31 DIAGNOSIS — I5023 Acute on chronic systolic (congestive) heart failure: Secondary | ICD-10-CM | POA: Diagnosis not present

## 2021-01-31 DIAGNOSIS — R609 Edema, unspecified: Secondary | ICD-10-CM | POA: Diagnosis not present

## 2021-01-31 DIAGNOSIS — M170 Bilateral primary osteoarthritis of knee: Secondary | ICD-10-CM | POA: Diagnosis present

## 2021-01-31 DIAGNOSIS — J9601 Acute respiratory failure with hypoxia: Secondary | ICD-10-CM | POA: Diagnosis not present

## 2021-01-31 DIAGNOSIS — I5033 Acute on chronic diastolic (congestive) heart failure: Secondary | ICD-10-CM

## 2021-01-31 DIAGNOSIS — R778 Other specified abnormalities of plasma proteins: Secondary | ICD-10-CM

## 2021-01-31 DIAGNOSIS — I4811 Longstanding persistent atrial fibrillation: Secondary | ICD-10-CM | POA: Diagnosis not present

## 2021-01-31 DIAGNOSIS — Z20822 Contact with and (suspected) exposure to covid-19: Secondary | ICD-10-CM | POA: Diagnosis present

## 2021-01-31 DIAGNOSIS — N1831 Chronic kidney disease, stage 3a: Secondary | ICD-10-CM | POA: Diagnosis not present

## 2021-01-31 DIAGNOSIS — I2721 Secondary pulmonary arterial hypertension: Secondary | ICD-10-CM | POA: Diagnosis not present

## 2021-01-31 DIAGNOSIS — E0590 Thyrotoxicosis, unspecified without thyrotoxic crisis or storm: Secondary | ICD-10-CM | POA: Diagnosis present

## 2021-01-31 DIAGNOSIS — M8580 Other specified disorders of bone density and structure, unspecified site: Secondary | ICD-10-CM | POA: Diagnosis present

## 2021-01-31 DIAGNOSIS — R0602 Shortness of breath: Secondary | ICD-10-CM | POA: Diagnosis not present

## 2021-01-31 DIAGNOSIS — I4819 Other persistent atrial fibrillation: Secondary | ICD-10-CM | POA: Diagnosis present

## 2021-01-31 DIAGNOSIS — E663 Overweight: Secondary | ICD-10-CM | POA: Diagnosis present

## 2021-01-31 DIAGNOSIS — D6859 Other primary thrombophilia: Secondary | ICD-10-CM | POA: Diagnosis present

## 2021-01-31 DIAGNOSIS — R7989 Other specified abnormal findings of blood chemistry: Secondary | ICD-10-CM

## 2021-01-31 DIAGNOSIS — E1151 Type 2 diabetes mellitus with diabetic peripheral angiopathy without gangrene: Secondary | ICD-10-CM | POA: Diagnosis present

## 2021-01-31 DIAGNOSIS — R0902 Hypoxemia: Secondary | ICD-10-CM | POA: Diagnosis present

## 2021-01-31 DIAGNOSIS — M19042 Primary osteoarthritis, left hand: Secondary | ICD-10-CM | POA: Diagnosis present

## 2021-01-31 DIAGNOSIS — E785 Hyperlipidemia, unspecified: Secondary | ICD-10-CM | POA: Diagnosis present

## 2021-01-31 DIAGNOSIS — I130 Hypertensive heart and chronic kidney disease with heart failure and stage 1 through stage 4 chronic kidney disease, or unspecified chronic kidney disease: Secondary | ICD-10-CM | POA: Diagnosis present

## 2021-01-31 LAB — I-STAT ARTERIAL BLOOD GAS, ED
Acid-Base Excess: 5 mmol/L — ABNORMAL HIGH (ref 0.0–2.0)
Bicarbonate: 36 mmol/L — ABNORMAL HIGH (ref 20.0–28.0)
Calcium, Ion: 1.3 mmol/L (ref 1.15–1.40)
HCT: 54 % — ABNORMAL HIGH (ref 36.0–46.0)
Hemoglobin: 18.4 g/dL — ABNORMAL HIGH (ref 12.0–15.0)
O2 Saturation: 91 %
Patient temperature: 97.7
Potassium: 4.3 mmol/L (ref 3.5–5.1)
Sodium: 138 mmol/L (ref 135–145)
TCO2: 38 mmol/L — ABNORMAL HIGH (ref 22–32)
pCO2 arterial: 73.6 mmHg (ref 32.0–48.0)
pH, Arterial: 7.295 — ABNORMAL LOW (ref 7.350–7.450)
pO2, Arterial: 68 mmHg — ABNORMAL LOW (ref 83.0–108.0)

## 2021-01-31 LAB — TROPONIN I (HIGH SENSITIVITY)
Troponin I (High Sensitivity): 17 ng/L (ref <18)
Troponin I (High Sensitivity): 20 ng/L — ABNORMAL HIGH (ref <18)
Troponin I (High Sensitivity): 20 ng/L — ABNORMAL HIGH (ref <18)
Troponin I (High Sensitivity): 20 ng/L — ABNORMAL HIGH (ref <18)

## 2021-01-31 LAB — GLUCOSE, CAPILLARY: Glucose-Capillary: 223 mg/dL — ABNORMAL HIGH (ref 70–99)

## 2021-01-31 LAB — SARS CORONAVIRUS 2 BY RT PCR (HOSPITAL ORDER, PERFORMED IN ~~LOC~~ HOSPITAL LAB): SARS Coronavirus 2: NEGATIVE

## 2021-01-31 LAB — ECHOCARDIOGRAM COMPLETE
Area-P 1/2: 3.71 cm2
S' Lateral: 2.9 cm

## 2021-01-31 LAB — HEPATIC FUNCTION PANEL
ALT: 28 U/L (ref 0–44)
AST: 33 U/L (ref 15–41)
Albumin: 3.7 g/dL (ref 3.5–5.0)
Alkaline Phosphatase: 101 U/L (ref 38–126)
Bilirubin, Direct: 0.3 mg/dL — ABNORMAL HIGH (ref 0.0–0.2)
Indirect Bilirubin: 0.7 mg/dL (ref 0.3–0.9)
Total Bilirubin: 1 mg/dL (ref 0.3–1.2)
Total Protein: 7.9 g/dL (ref 6.5–8.1)

## 2021-01-31 LAB — D-DIMER, QUANTITATIVE: D-Dimer, Quant: 0.74 ug/mL-FEU — ABNORMAL HIGH (ref 0.00–0.50)

## 2021-01-31 LAB — CBG MONITORING, ED
Glucose-Capillary: 222 mg/dL — ABNORMAL HIGH (ref 70–99)
Glucose-Capillary: 209 mg/dL — ABNORMAL HIGH (ref 70–99)

## 2021-01-31 MED ORDER — EZETIMIBE 10 MG PO TABS
10.0000 mg | ORAL_TABLET | Freq: Every day | ORAL | Status: DC
  Administered 2021-01-31 – 2021-02-05 (×6): 10 mg via ORAL
  Filled 2021-01-31 (×6): qty 1

## 2021-01-31 MED ORDER — APIXABAN 5 MG PO TABS
5.0000 mg | ORAL_TABLET | Freq: Two times a day (BID) | ORAL | Status: DC
  Administered 2021-01-31 – 2021-02-05 (×10): 5 mg via ORAL
  Filled 2021-01-31 (×11): qty 1

## 2021-01-31 MED ORDER — CARVEDILOL 6.25 MG PO TABS
6.2500 mg | ORAL_TABLET | Freq: Two times a day (BID) | ORAL | Status: DC
Start: 2021-01-31 — End: 2021-02-05
  Administered 2021-01-31 – 2021-02-05 (×11): 6.25 mg via ORAL
  Filled 2021-01-31: qty 1
  Filled 2021-01-31: qty 2
  Filled 2021-01-31 (×8): qty 1

## 2021-01-31 MED ORDER — FUROSEMIDE 10 MG/ML IJ SOLN
60.0000 mg | Freq: Two times a day (BID) | INTRAMUSCULAR | Status: DC
  Administered 2021-01-31 – 2021-02-02 (×4): 60 mg via INTRAVENOUS
  Filled 2021-01-31 (×4): qty 6

## 2021-01-31 MED ORDER — SODIUM CHLORIDE 0.9% FLUSH: 3.0000 mL | INTRAVENOUS | Status: DC | PRN

## 2021-01-31 MED ORDER — VITAMIN D 25 MCG (1000 UNIT) PO TABS
1000.0000 [IU] | ORAL_TABLET | Freq: Every day | ORAL | Status: DC
  Administered 2021-01-31 – 2021-02-05 (×6): 1000 [IU] via ORAL
  Filled 2021-01-31 (×6): qty 1

## 2021-01-31 MED ORDER — IOHEXOL 350 MG/ML SOLN
80.0000 mL | Freq: Once | INTRAVENOUS | Status: AC | PRN
  Administered 2021-01-31: 80 mL via INTRAVENOUS

## 2021-01-31 MED ORDER — ONDANSETRON HCL 4 MG/2ML IJ SOLN: 4.0000 mg | Freq: Four times a day (QID) | INTRAMUSCULAR | Status: DC | PRN

## 2021-01-31 MED ORDER — FUROSEMIDE 10 MG/ML IJ SOLN
40.0000 mg | Freq: Once | INTRAMUSCULAR | Status: AC
  Administered 2021-01-31: 40 mg via INTRAVENOUS
  Filled 2021-01-31: qty 4

## 2021-01-31 MED ORDER — NITROGLYCERIN 0.4 MG SL SUBL: 0.4000 mg | SUBLINGUAL_TABLET | SUBLINGUAL | Status: DC | PRN

## 2021-01-31 MED ORDER — ACETAMINOPHEN 325 MG PO TABS
650.0000 mg | ORAL_TABLET | ORAL | Status: DC | PRN
  Administered 2021-02-02 – 2021-02-03 (×2): 650 mg via ORAL
  Filled 2021-01-31 (×2): qty 2

## 2021-01-31 MED ORDER — CLOPIDOGREL BISULFATE 75 MG PO TABS
75.0000 mg | ORAL_TABLET | Freq: Every day | ORAL | Status: DC
  Administered 2021-01-31 – 2021-02-05 (×6): 75 mg via ORAL
  Filled 2021-01-31 (×7): qty 1

## 2021-01-31 MED ORDER — SODIUM CHLORIDE 0.9% FLUSH
3.0000 mL | Freq: Two times a day (BID) | INTRAVENOUS | Status: DC
  Administered 2021-01-31 – 2021-02-05 (×9): 3 mL via INTRAVENOUS

## 2021-01-31 MED ORDER — INSULIN GLARGINE 100 UNIT/ML ~~LOC~~ SOLN
15.0000 [IU] | Freq: Every day | SUBCUTANEOUS | Status: DC
  Administered 2021-02-01 – 2021-02-04 (×4): 15 [IU] via SUBCUTANEOUS
  Filled 2021-01-31 (×6): qty 0.15

## 2021-01-31 MED ORDER — INSULIN ASPART 100 UNIT/ML ~~LOC~~ SOLN
0.0000 [IU] | Freq: Three times a day (TID) | SUBCUTANEOUS | Status: DC
  Administered 2021-01-31: 3 [IU] via SUBCUTANEOUS
  Administered 2021-02-01: 5 [IU] via SUBCUTANEOUS
  Administered 2021-02-02 (×2): 2 [IU] via SUBCUTANEOUS
  Administered 2021-02-02: 3 [IU] via SUBCUTANEOUS
  Administered 2021-02-03 (×2): 5 [IU] via SUBCUTANEOUS
  Administered 2021-02-03: 2 [IU] via SUBCUTANEOUS
  Administered 2021-02-04: 5 [IU] via SUBCUTANEOUS
  Administered 2021-02-04: 2 [IU] via SUBCUTANEOUS
  Administered 2021-02-05: 5 [IU] via SUBCUTANEOUS
  Administered 2021-02-05: 1 [IU] via SUBCUTANEOUS

## 2021-01-31 MED ORDER — SODIUM CHLORIDE 0.9 % IV SOLN: 250.0000 mL | INTRAVENOUS | Status: DC | PRN

## 2021-01-31 MED ORDER — INSULIN ASPART 100 UNIT/ML ~~LOC~~ SOLN
0.0000 [IU] | Freq: Every day | SUBCUTANEOUS | Status: DC
  Administered 2021-01-31: 2 [IU] via SUBCUTANEOUS
  Administered 2021-02-01 – 2021-02-02 (×2): 3 [IU] via SUBCUTANEOUS
  Administered 2021-02-03: 2 [IU] via SUBCUTANEOUS
  Administered 2021-02-04: 0 [IU] via SUBCUTANEOUS

## 2021-01-31 NOTE — H&P (Addendum)
History and Physical    EVONE ARSENEAU HCW:[id number] DOB: June 27, 1931 DOA: 01/30/2021  PCP: [name], MD [facility]  Chief Complaint: SOB/DOE and legs edema  HPI: [name] is a 87 y.o. female with medical history significant of chronic diastolic heart failure, CAD, previous MI in 2008, A. fib/a flutter on Eliquis, PAD with PTA and stent of right external iliac artery, T2DM, HTN, HLD, COPD not on home O2, OSA on CPAP, who presented with SOB/DOE and legs edema.   Patient follows with cardiology as out patient. She had LHC on 05/16/21, which showed Severe 3 vessel obstructive CAD. She had PCI of the proximal LCx/OM1 with DES x 1 with IVUS guidance, and PCI of the proximal LAD with orbital atherectomy and DES x 1. She is currently on Plavix 75 mg daily and Eliquis 5 mg twice daily.    Patient reports that she has had progressive worsening of SOB, DOE and leg edema over the last 1-2 months. Over last few days, her symptoms worsened. Associated symptoms included orthopnea and PND.  She takes home Lasix 80 mg p.o. twice daily and reports medical compliance. She uses CPAP at night for OSA and reports compliance. She does not use home O2. The EMS was called today and she was noted to have room air O2 sat 78% initially.  Patient was placed on NRM enroute to the ED. in the emergency room, she was afebrile with a pulse 90s, RR 28, BP 160/65.  O2 sats 97% on 3 LNC.  ABG showed pH 7.295, PCO2 33.6, PO2 68.  Labs showed BNP 171.8, troponin 17, D-dimer 0.74, Covid negative, nonrevealing CBC and BMP. CXR showed "New mild interstitial edema and small bilateral pleural effusions". CTPA showed no PE.  Patient received Lasix 40 mg IV x1 at ED.  BiPAP was initiated.  Denies fevers, chills, cough, wheezing, chest pain, chest pressure, palpitation, nausea, vomiting, diarrhea, abdominal pain, dysuria, urinary frequency or urgency. She has completed vaccinations for COVID including  booster shot. Per ED note, her facility reports covid outbreak with staff but patient denies exposure.    Review of Systems: As per HPI otherwise 10 point review of systems negative.  Review of Systems Otherwise negative except as per HPI, including: General: Denies fever, chills, night sweats or unintended weight loss. Resp: positive for SOB, DOE and legs edema. positive for orthopnea and PND.Denies cough, wheezing,  Cardiac: Denies chest pain, palpitations, orthopnea, paroxysmal nocturnal dyspnea. GI: Denies abdominal pain, nausea, vomiting, diarrhea or constipation GU: Denies dysuria, frequency, hesitancy or incontinence MS: Denies muscle aches, joint pain or swelling Neuro: Denies headache, neurologic deficits (focal weakness, numbness, tingling), abnormal gait Psych: Denies anxiety, depression, SI/HI/AVH Skin: Denies new rashes or lesions ID: Denies sick contacts, exotic exposures, travel  Past Medical History:  Diagnosis Date  . Anemia    iron defficiency  . Anginal pain ([place])   . Arthritis    "knees, feet, hands; joints" (05/27/2015)  . Asthma   . Atrial fibrillation or flutter    s/p RFCA 7/08;   s/p DCCV in past;   previously on amiodarone;  amio stopped due to lung toxicity  . CAD (coronary artery disease)    s/p NSTEMI tx with BMS to OM1 3/08;  cath 3/08: pOM 99% tx with PCI, pLAD 20%, ? mod stenosis at the AM  . CAP (community acquired pneumonia) 05/24/2015  . CHF (congestive heart failure) ([place])   . Chronic diastolic heart failure ([place])  echo 11/11:  EF 55-60%, severe LVH, mod LAE, mild MR, mildly increased PASP  . COPD (chronic obstructive pulmonary disease) ([place])   . Degenerative joint disease   . Dystrophy, corneal stromal   . Heart murmur   . HLD (hyperlipidemia)   . HTN (hypertension)    essential nos  . Hypopotassemia    PMH of  . Muscle pain   . Myocardial infarction ([place]) 2008  . OSA on CPAP   . Osteoporosis   . Pneumonia 05/27/2015  . Protein  calorie malnutrition ([place])   . Rash and nonspecific skin eruption    both arms,awaiting bio   dr Delman Cheadle  . Seasonal allergies   . Shortness of breath dyspnea   . Type II diabetes mellitus ([place])    Dr Loanne Drilling    Past Surgical History:  Procedure Laterality Date  . A FLUTTER ABLATION     Dr Lovena Le  . ABDOMINAL AORTOGRAM W/LOWER EXTREMITY Bilateral 04/11/2020   Procedure: ABDOMINAL AORTOGRAM W/LOWER EXTREMITY;  Surgeon: Angelia Mould, MD;  Location: [facility];  Service: Cardiovascular;  Laterality: Bilateral;  . CATARACT EXTRACTION W/ INTRAOCULAR LENS  IMPLANT, BILATERAL Bilateral   . CHOLECYSTECTOMY    . COLONOSCOPY  6/07   2 polyps Dr. Kinnie Feil in [place]  . colonoscopy with polypectomy      X 2; [facility]  . CORNEAL TRANSPLANT Bilateral   . CORONARY ANGIOPLASTY WITH STENT PLACEMENT  02/2007   BMS w/Dr Lovena Le  . CORONARY ATHERECTOMY N/A 05/16/2020   Procedure: CORONARY ATHERECTOMY;  Surgeon: Martinique, Peter M, MD;  Location: [facility];  Service: Cardiovascular;  Laterality: N/A;  . CORONARY BALLOON ANGIOPLASTY N/A 05/16/2020   Procedure: CORONARY BALLOON ANGIOPLASTY;  Surgeon: Martinique, Peter M, MD;  Location: [facility];  Service: Cardiovascular;  Laterality: N/A;  . CORONARY STENT INTERVENTION N/A 05/16/2020   Procedure: CORONARY STENT INTERVENTION;  Surgeon: Martinique, Peter M, MD;  Location: [facility];  Service: Cardiovascular;  Laterality: N/A;  . EYE SURGERY    . gravid 2 para 2    . INTRAVASCULAR ULTRASOUND/IVUS N/A 05/16/2020   Procedure: Intravascular Ultrasound/IVUS;  Surgeon: Martinique, Peter M, MD;  Location: [facility];  Service: Cardiovascular;  Laterality: N/A;  . KNEE CARTILAGE SURGERY Right   . LEFT AND RIGHT HEART CATHETERIZATION WITH CORONARY ANGIOGRAM N/A 05/07/2014   Procedure: LEFT AND RIGHT HEART CATHETERIZATION WITH CORONARY ANGIOGRAM;  Surgeon: Peter M Martinique, MD;  Location: [facility];  Service: Cardiovascular;  Laterality:  N/A;  . PERIPHERAL VASCULAR INTERVENTION  04/11/2020   Procedure: PERIPHERAL VASCULAR INTERVENTION;  Surgeon: Angelia Mould, MD;  Location: [facility];  Service: Cardiovascular;;  . RIGHT/LEFT HEART CATH AND CORONARY ANGIOGRAPHY N/A 05/16/2020   Procedure: RIGHT/LEFT HEART CATH AND CORONARY ANGIOGRAPHY;  Surgeon: Jolaine Artist, MD;  Location: [facility];  Service: Cardiovascular;  Laterality: N/A;  . TOTAL KNEE ARTHROPLASTY Right 06/28/2016   Procedure: TOTAL KNEE ARTHROPLASTY;  Surgeon: Frederik Pear, MD;  Location: [facility];  Service: Orthopedics;  Laterality: Right;    SOCIAL HISTORY:  reports that she quit smoking about 52 years ago. Her smoking use included cigarettes. She has a 4.50 pack-year smoking history. She has never used smokeless tobacco. She reports current alcohol use. She reports that she does not use drugs.  Allergies  Allergen Reactions  . Levaquin [Levofloxacin] Shortness Of Breath and Swelling    angioedema  . Lobster [Shellfish Allergy] Other (See  Comments)    angioedema  . Penicillins Rash    Has patient had a PCN reaction causing immediate rash, facial/tongue/throat swelling, SOB or lightheadedness with hypotension: Yes Has patient had a PCN reaction causing severe rash involving mucus membranes or skin necrosis: No Has patient had a PCN reaction that required hospitalization: No Has patient had a PCN reaction occurring within the last 10 years: No If all of the above answers are "NO", then may proceed with Cephalosporin use.  . Valsartan Other (See Comments)    REACTION: angioedema  . Codeine Other (See Comments)    Mental status changes  . Lipitor [Atorvastatin] Other (See Comments)    weakness  . Oxycodone Other (See Comments)    hallucinations  . Statins     Made her too weak  . Tramadol   . Amlodipine Besylate Other (See Comments)    REACTION: tingling in lips & gum edema 7/12: talked to patient, states she is tolerating well  .  Crestor [Rosuvastatin Calcium] Rash  . Tramadol Hcl Nausea And Vomiting    FAMILY HISTORY: Family History  Problem Relation Age of Onset  . Diabetes Mother   . Hypertension Mother   . Transient ischemic attack Mother   . Arthritis Mother   . Heart attack Father 38  . Arthritis Father   . Hypertension Father   . Breast cancer Maternal Aunt   . Arthritis Maternal Aunt      Prior to Admission medications   Medication Sig Start Date End Date Taking? Authorizing Provider  acetaminophen (TYLENOL) 500 MG tablet Take 1,000 mg by mouth every 6 (six) hours as needed (pain).   Yes [provider]  amLODipine (NORVASC) 10 MG tablet Take 1 tablet (10 mg total) by mouth daily. 12/18/20  Yes Bensimhon, Shaune Pascal, MD  CALCIUM-MAG-VIT C-VIT D PO Take 1 tablet by mouth daily.    Yes [provider]  carvedilol (COREG) 6.25 MG tablet TAKE 1 TABLET BY MOUTH  TWICE DAILY WITH MEALS Patient taking differently: Take 6.25 mg by mouth 2 (two) times daily with a meal. 12/22/20  Yes Bensimhon, Shaune Pascal, MD  cholecalciferol (VITAMIN D) 1000 units tablet Take 1,000 Units by mouth daily after supper.   Yes [provider]  clopidogrel (PLAVIX) 75 MG tablet TAKE 1 TABLET BY MOUTH ONCE DAILY WITH BREAKFAST Patient taking differently: Take 75 mg by mouth daily. 12/17/20  Yes Hilty, Nadean Corwin, MD  ELIQUIS 5 MG TABS tablet TAKE 1 TABLET BY MOUTH  TWICE DAILY Patient taking differently: Take 5 mg by mouth 2 (two) times daily. 12/03/20  Yes Bensimhon, Shaune Pascal, MD  ezetimibe (ZETIA) 10 MG tablet Take 1 tablet (10 mg total) by mouth daily. 08/12/20  Yes Hilty, Nadean Corwin, MD  furosemide (LASIX) 80 MG tablet TAKE 1 TABLET BY MOUTH  TWICE DAILY Patient taking differently: Take 80 mg by mouth 2 (two) times daily. 12/22/20  Yes Bensimhon, Shaune Pascal, MD  Insulin Lispro Prot & Lispro (HUMALOG MIX 75/25 KWIKPEN) (75-25) 100 UNIT/ML Kwikpen Inject 36 units into the skin daily with breakfast Patient taking  differently: 36 Units in the morning and at bedtime. Inject 36 units into the skin daily with breakfast 12/09/20  Yes Renato Shin, MD  Multiple Vitamin (MULTIVITAMIN WITH MINERALS) TABS tablet Take 1 tablet by mouth daily. Centrum Silver   Yes [provider]  nitroGLYCERIN (NITROSTAT) 0.4 MG SL tablet Place 1 tablet (0.4 mg total) under the tongue every 5 (five) minutes as needed  for chest pain. Reported on 01/28/2016 01/03/20  Yes Bensimhon, Shaune Pascal, MD  potassium chloride SA (KLOR-CON) 20 MEQ tablet Take 20 mEq by mouth 2 (two) times daily.   Yes [provider]  empagliflozin (JARDIANCE) 10 MG TABS tablet Take 10 mg by mouth daily before breakfast. Patient not taking: No sig reported 10/26/19   Bensimhon, Shaune Pascal, MD  Evolocumab (REPATHA SURECLICK) [id number] MG/ML SOAJ Inject 1 Dose into the skin every 14 (fourteen) days. Patient not taking: No sig reported 12/25/20   Pixie Casino, MD  glucose blood (ONETOUCH VERIO) test strip USE TO MONITOR GLUCOSE  LEVELS TWICE DAILY 10/03/20   Renato Shin, MD  Lancets [facility] DELICA PLUS [id number]) [place] USE TO MONITOR GLUCOSE  LEVELS TWICE DAILY 05/03/20   Renato Shin, MD    Physical Exam: Vitals:   01/31/21 1100 01/31/21 1115 01/31/21 1215 01/31/21 1242  BP: (!) 125/105 (!) 149/79 (!) 181/86   Pulse: 84 89 99   Resp: 20 (!) 21 (!) 24   Temp:      TempSrc:      SpO2: 98% 98% 95% 95%      Constitutional: NAD, calm, tachypnea.  Acute ill-appearing. Eyes: PERRL, lids and conjunctivae normal ENMT: Mucous membranes are moist. Posterior pharynx clear of any exudate or lesions.Normal dentition.  Neck: normal, supple, no masses, no thyromegaly. +JVD Respiratory: Mild tachypnea.  Bilateral sounds diminished with bibasilar rales.  No wheezing, no rhonchi. Normal respiratory effort. No accessory muscle use.  Cardiovascular: Irregularly irregular, no murmurs / rubs / gallops. B/L LE 2-3+ pitting extremity edema up to below knees. 2+ pedal  pulses. No carotid bruits.  Abdomen: no tenderness, no masses palpated. No hepatosplenomegaly. Bowel sounds positive.  Musculoskeletal: no clubbing / cyanosis. No joint deformity upper and lower extremities. Good ROM, no contractures. Normal muscle tone.  Skin: no rashes, lesions, ulcers. No induration Neurologic: CN 2-12 grossly intact. Sensation intact, DTR normal. Strength 5/5 in all 4.  Psychiatric: Normal judgment and insight. Alert and oriented x 3. Normal mood.     Labs on Admission: I have personally reviewed following labs and imaging studies  CBC: Recent Labs  Lab 01/30/21 1037 01/31/21 1225  WBC 4.9  --   HGB 15.7* 18.4*  HCT 51.5* 54.0*  MCV 98.1  --   PLT 174  --    Basic Metabolic Panel: Recent Labs  Lab 01/30/21 1037 01/31/21 1225  Deanglo Hissong 141 138  K 4.6 4.3  CL 104  --   CO2 29  --   GLUCOSE 165*  --   BUN 19  --   CREATININE 1.01*  --   CALCIUM 9.8  --    GFR: CrCl cannot be calculated (Unknown ideal weight.). Liver Function Tests: Recent Labs  Lab 01/31/21 0758  AST 33  ALT 28  ALKPHOS 101  BILITOT 1.0  PROT 7.9  ALBUMIN 3.7   No results for input(s): LIPASE, AMYLASE in the last 168 hours. No results for input(s): AMMONIA in the last 168 hours. Coagulation Profile: No results for input(s): INR, PROTIME in the last 168 hours. Cardiac Enzymes: No results for input(s): CKTOTAL, CKMB, CKMBINDEX, TROPONINI in the last 168 hours. BNP (last 3 results) No results for input(s): PROBNP in the last 8760 hours. HbA1C: No results for input(s): HGBA1C in the last 72 hours. CBG: Recent Labs  Lab 01/30/21 1245 01/30/21 1814 01/31/21 0757  GLUCAP 63* 126* 222*   Lipid Profile: No results for input(s): CHOL, HDL, LDLCALC, TRIG,  CHOLHDL, LDLDIRECT in the last 72 hours. Thyroid Function Tests: No results for input(s): TSH, T4TOTAL, FREET4, T3FREE, THYROIDAB in the last 72 hours. Anemia Panel: No results for input(s): VITAMINB12, FOLATE, FERRITIN, TIBC,  IRON, RETICCTPCT in the last 72 hours. Urine analysis:    Component Value Date/Time   COLORURINE YELLOW 08/29/2016 1632   APPEARANCEUR CLOUDY (A) 08/29/2016 1632   LABSPEC 1.027 08/29/2016 1632   PHURINE 5.5 08/29/2016 1632   GLUCOSEU 100 (A) 08/29/2016 1632   GLUCOSEU 100 (A) 08/19/2015 1440   HGBUR LARGE (A) 08/29/2016 1632   BILIRUBINUR NEGATIVE 08/29/2016 1632   BILIRUBINUR negative 08/26/2016 1137   KETONESUR NEGATIVE 08/29/2016 1632   PROTEINUR 100 (A) 08/29/2016 1632   UROBILINOGEN 0.2 08/26/2016 1137   UROBILINOGEN 0.2 08/19/2015 1440   NITRITE POSITIVE (A) 08/29/2016 1632   LEUKOCYTESUR MODERATE (A) 08/29/2016 1632   Sepsis Labs: !!!!!!!!!!!!!!!!!!!!!!!!!!!!!!!!!!!!!!!!!!!! @LABRCNTIP (procalcitonin:4,lacticidven:4) ) Recent Results (from the past 240 hour(s))  SARS Coronavirus 2 by RT PCR (hospital order, performed in [facility] hospital lab) Nasopharyngeal Nasopharyngeal Swab     Status: None   Collection Time: 01/31/21  2:49 AM   Specimen: Nasopharyngeal Swab  Result Value Ref Range Status   SARS Coronavirus 2 NEGATIVE NEGATIVE Final    Comment: (NOTE) SARS-CoV-2 target nucleic acids are NOT DETECTED.  The SARS-CoV-2 RNA is generally detectable in upper and lower respiratory specimens during the acute phase of infection. The lowest concentration of SARS-CoV-2 viral copies this assay can detect is 250 copies / mL. A negative result does not preclude SARS-CoV-2 infection and should not be used as the sole basis for treatment or other patient management decisions.  A negative result may occur with improper specimen collection / handling, submission of specimen other than nasopharyngeal swab, presence of viral mutation(s) within the areas targeted by this assay, and inadequate number of viral copies (<250 copies / mL). A negative result must be combined with clinical observations, patient history, and epidemiological information.  Fact Sheet for Patients:    StrictlyIdeas.no  Fact Sheet for Healthcare Providers: BankingDealers.co.za  This test is not yet approved or  cleared by the [place] FDA and has been authorized for detection and/or diagnosis of SARS-CoV-2 by FDA under an Emergency Use Authorization (EUA).  This EUA will remain in effect (meaning this test can be used) for the duration of the COVID-19 declaration under Section 564(b)(1) of the Act, 21 U.S.C. section 360bbb-3(b)(1), unless the authorization is terminated or revoked sooner.  Performed at [facility], [place] [address], [place], [place] [address]      Radiological Exams on Admission: DG Chest 2 View  Result Date: 01/30/2021 CLINICAL DATA:  Shortness of breath. Bilateral ankle swelling. History of CHF. EXAM: CHEST - 2 VIEW COMPARISON:  05/15/2020 FINDINGS: The cardiac silhouette is borderline enlarged. Aortic atherosclerosis is noted. The interstitial markings are mildly increased diffusely compared to the prior study with a few Kerley lines. There are new small bilateral pleural effusions. No pneumothorax is identified. No acute osseous abnormality is seen. IMPRESSION: New mild interstitial edema and small bilateral pleural effusions. Electronically Signed   By: Logan Bores M.D.   On: 01/30/2021 11:07   CT Angio Chest PE W/Cm &/Or Wo Cm  Result Date: 01/31/2021 CLINICAL DATA:  Positive D-dimer. EXAM: CT ANGIOGRAPHY CHEST WITH CONTRAST TECHNIQUE: Multidetector CT imaging of the chest was performed using the standard protocol during bolus administration of intravenous contrast. Multiplanar CT image reconstructions and MIPs were obtained to evaluate the vascular  anatomy. CONTRAST:  24mL OMNIPAQUE IOHEXOL 350 MG/ML SOLN COMPARISON:  May 25, 2017. FINDINGS: Cardiovascular: Satisfactory opacification of the pulmonary arteries to the segmental level. No evidence of pulmonary embolism. Mild cardiomegaly is noted. No pericardial  effusion. Atherosclerosis of thoracic aorta is noted without aneurysm formation. Coronary artery calcifications are noted with probable stent seen in the left anterior descending artery. Mediastinum/Nodes: Small sliding-type hiatal hernia is noted. Thyroid gland is unremarkable. 1.4 cm subcarinal adenopathy is noted. 13 mm right hilar adenopathy is noted. 10 mm aortopulmonary window adenopathy is noted. This may be inflammatory in etiology, but neoplasm cannot be excluded. Lungs/Pleura: No pneumothorax or pleural effusion is noted. Emphysematous disease is noted in the upper lobes bilaterally. Mild bilateral posterior basilar subsegmental atelectasis is noted. Upper Abdomen: No acute abnormality. Musculoskeletal: No chest wall abnormality. No acute or significant osseous findings. Review of the MIP images confirms the above findings. IMPRESSION: 1. No definite evidence of pulmonary embolus. 2. Coronary artery calcifications are noted with probable stent seen in the left anterior descending artery. 3. Small sliding-type hiatal hernia. 4. Mediastinal adenopathy is noted which may be inflammatory in etiology, but neoplasm cannot be excluded. Follow-up CT scan in 3 months is recommended to ensure stability or resolution. 5. Emphysema and aortic atherosclerosis. Aortic Atherosclerosis (ICD10-I70.0) and Emphysema (ICD10-J43.9). Electronically Signed   By: Lupita Raider M.D.   On: 01/31/2021 11:49     All images have been reviewed by me personally.  EKG: Independently reviewed.   Assessment/Plan Principal Problem:   Acute respiratory failure with hypoxia and hypercapnia (HCC) Active Problems:   HYPERLIPIDEMIA   HTN (hypertension)   Coronary atherosclerosis   ATRIAL FIBRILLATION   Atrial flutter (HCC)   Hyperthyroidism   Pulmonary HTN (HCC)   Chronic diastolic heart failure (HCC)   OSA    COPD GOLD I    Chronic kidney disease (CKD)   PAD (peripheral artery disease) (HCC)   Acute on chronic diastolic  CHF (congestive heart failure) (HCC)   Elevated troponin   Type 2 diabetes mellitus with hyperlipidemia (HCC)   Assessment  plan  #Acute hypoxia and hypercapnia respiratory failure #Acute on chronic diastolic heart failure #CAD # LHC and stents on 05/16/20  Patient presented with worsening SOB, DOE and legs edema. She is volume overload on exam.  ABG consistent with acute hypoxia and hypercapnic restaurant failure, which is likely related to acute pulmonary edema, volume overload secondary to acute on chronic diastolic heart failure.  - admit to progressive unit -O2 supplement as needed -Daily weight - strict intake and output - NIV protocol-BiPAP -BNP is elevated -Trend troponin -Echo is pending -Diuretics-lasix 60 mg IV BID -continue nighttime CPAP -depending on echo finding, consider to consult cardiology if abnormal finding on echocardiogram. I will sent inbasket message to let her outpatient cardiologist know of her admission   #Mildly elevated troponin -likely demand supply -No ischemic change on EKG -Continue trend troponin and obtain echocardiogram   #Chronic A. fib/a flutter on Eliquis -chronic -Continue Eliquis   #T2DM -hemoglobin A1c -Glucose AC and at bedtime   #HTN and HLD -chronic and stable   #COPD not on home O2 # Hx of pulmonary HTN #OSA on CPAP -no evidence of COPD exacerbation -Continue home CPAP   #Incidental finding of mediastinal adenopathy on CT scan - Mediastinal adenopathy is noted which may be inflammatory in etiology, but neoplasm cannot be excluded. Follow-up CT scan in 3 months is recommended to ensure stability or resolution.   #History  of PAD PAD with PTA and stent of right external iliac artery - chronic and at baseline       DVT prophylaxis: SCD and Eliquis Code Status: DNR/DNI Family Communication: Yes. daughter Consults called: not needed for now Admission status: inpatient  Status is: Inpatient   Remains inpatient  appropriate because:Ongoing diagnostic testing needed not appropriate for outpatient work up   Dispo: The patient is from: Home              Anticipated d/c is to: Home              Anticipated d/c date is: 3 days              Patient currently is not medically stable to d/c.   Difficult to place patient No       Time Spent: 65 minutes.  >50% of the time was devoted to discussing the patients care, assessment, plan and disposition with other care givers along with counseling the patient about the risks and benefits of treatment.    Charlann Lange MD [facility]  If 7PM-7AM, please contact night-coverage   01/31/2021, 1:55 PM

## 2021-01-31 NOTE — Progress Notes (Signed)
  Echocardiogram 2D Echocardiogram has been performed.  [name] [name] 01/31/2021, 4:00 PM

## 2021-01-31 NOTE — ED Provider Notes (Signed)
[facility] EMERGENCY DEPARTMENT Provider Note   CSN: [id number] Arrival date & time: 01/30/21  1029     History Chief Complaint  Patient presents with  . Shortness of Breath    [name] is a 87 y.o. female.  Patient is a 87 year old female who presents with shortness of breath.  She has a history of atrial fibrillation on Eliquis, hypertension, COPD, CHF.  She reports that yesterday morning when she came into the emergency department she had an episode of acute dyspnea.  She says it had to started that morning.  She also has noticed some increased leg swelling over the last few weeks.  She denies any significant cough or chest congestion.  No known fevers.  She did have some tightness across her chest with the shortness of breath that started yesterday and she took a nitroglycerin which relieved the symptoms although she was still having the shortness of breath.  She has not had any ongoing chest discomfort.  She has had a prolonged stay in the waiting room since yesterday due to the boarding situation.  She normally does not wear oxygen.  She was noted to be hypoxic on arrival with EMS noting her oxygen saturation to be 78% on room air.  They placed a nonrebreather.  He was transitioned to a nasal cannula and she is currently on nasal cannula at 3 L/min.  She does live in an independent living situation in a retirement facility.  There has been a recent Covid outbreak.        Past Medical History:  Diagnosis Date  . Anemia    iron defficiency  . Anginal pain ([place])   . Arthritis    "knees, feet, hands; joints" (05/27/2015)  . Asthma   . Atrial fibrillation or flutter    s/p RFCA 7/08;   s/p DCCV in past;   previously on amiodarone;  amio stopped due to lung toxicity  . CAD (coronary artery disease)    s/p NSTEMI tx with BMS to OM1 3/08;  cath 3/08: pOM 99% tx with PCI, pLAD 20%, ? mod stenosis at the AM  . CAP (community acquired pneumonia) 05/24/2015  .  CHF (congestive heart failure) ([place])   . Chronic diastolic heart failure (HCC)    echo 11/11:  EF 55-60%, severe LVH, mod LAE, mild MR, mildly increased PASP  . COPD (chronic obstructive pulmonary disease) ([place])   . Degenerative joint disease   . Dystrophy, corneal stromal   . Heart murmur   . HLD (hyperlipidemia)   . HTN (hypertension)    essential nos  . Hypopotassemia    PMH of  . Muscle pain   . Myocardial infarction ([place]) 2008  . OSA on CPAP   . Osteoporosis   . Pneumonia 05/27/2015  . Protein calorie malnutrition ([place])   . Rash and nonspecific skin eruption    both arms,awaiting bio   dr Delman Cheadle  . Seasonal allergies   . Shortness of breath dyspnea   . Type II diabetes mellitus ([place])    Dr Loanne Drilling    Patient Active Problem List   Diagnosis Date Noted  . Neuropathy 08/19/2020  . PAD (peripheral artery disease) ([place]) 02/29/2020  . Chronic kidney disease (CKD) 02/20/2020  . Osteoarthritis 08/01/2019  . Arthritis of left sacroiliac joint 05/04/2017  . Osteopenia 02/17/2017  . Piriformis syndrome of left side 02/16/2017  . Acquired leg length discrepancy 02/16/2017  . COPD GOLD I  01/27/2017  . Lumbar radiculopathy,  acute 01/27/2017  . Granuloma annulare 07/27/2016  . Numbness of fingers 07/27/2016  . Primary osteoarthritis of right knee 06/27/2016  . Rash and nonspecific skin eruption 06/09/2016  . Diabetes ([place]) 03/07/2016  . OSA  08/28/2014  . Chronic diastolic heart failure ([place]) 07/30/2014  . Nocturnal hypoxemia 07/09/2014  . SOB (shortness of breath) 05/15/2014  . Pulmonary HTN ([place]) 05/15/2014  . Band keratopathy 01/16/2014  . 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 Hyperthyroidism 11/04/2011  . Multinodular goiter 10/14/2011  . Atrophy, Fuchs' 09/29/2011  . ATRIAL FIBRILLATION 06/03/2010  . VITILIGO 11/07/2009  . SINOATRIAL NODE DYSFUNCTION 06/16/2009  . Atrial flutter ([place]) 06/25/2008  . HYPERLIPIDEMIA 01/29/2008  . Coronary atherosclerosis  01/29/2008  . HTN (hypertension) 10/30/2007    Past Surgical History:  Procedure Laterality Date  . A FLUTTER ABLATION     Dr Lovena Le  . ABDOMINAL AORTOGRAM W/LOWER EXTREMITY Bilateral 04/11/2020   Procedure: ABDOMINAL AORTOGRAM W/LOWER EXTREMITY;  Surgeon: Angelia Mould, MD;  Location: [facility];  Service: Cardiovascular;  Laterality: Bilateral;  . CATARACT EXTRACTION W/ INTRAOCULAR LENS  IMPLANT, BILATERAL Bilateral   . CHOLECYSTECTOMY    . COLONOSCOPY  6/07   2 polyps Dr. Kinnie Feil in [place]  . colonoscopy with polypectomy      X 2; [facility]  . CORNEAL TRANSPLANT Bilateral   . CORONARY ANGIOPLASTY WITH STENT PLACEMENT  02/2007   BMS w/Dr Lovena Le  . CORONARY ATHERECTOMY N/A 05/16/2020   Procedure: CORONARY ATHERECTOMY;  Surgeon: Martinique, Peter M, MD;  Location: [facility];  Service: Cardiovascular;  Laterality: N/A;  . CORONARY BALLOON ANGIOPLASTY N/A 05/16/2020   Procedure: CORONARY BALLOON ANGIOPLASTY;  Surgeon: Martinique, Peter M, MD;  Location: [facility];  Service: Cardiovascular;  Laterality: N/A;  . CORONARY STENT INTERVENTION N/A 05/16/2020   Procedure: CORONARY STENT INTERVENTION;  Surgeon: Martinique, Peter M, MD;  Location: [facility];  Service: Cardiovascular;  Laterality: N/A;  . EYE SURGERY    . gravid 2 para 2    . INTRAVASCULAR ULTRASOUND/IVUS N/A 05/16/2020   Procedure: Intravascular Ultrasound/IVUS;  Surgeon: Martinique, Peter M, MD;  Location: [facility];  Service: Cardiovascular;  Laterality: N/A;  . KNEE CARTILAGE SURGERY Right   . LEFT AND RIGHT HEART CATHETERIZATION WITH CORONARY ANGIOGRAM N/A 05/07/2014   Procedure: LEFT AND RIGHT HEART CATHETERIZATION WITH CORONARY ANGIOGRAM;  Surgeon: Peter M Martinique, MD;  Location: [facility] CATH LAB;  Service: Cardiovascular;  Laterality: N/A;  . PERIPHERAL VASCULAR INTERVENTION  04/11/2020   Procedure: PERIPHERAL VASCULAR INTERVENTION;  Surgeon: Angelia Mould, MD;  Location: [facility];   Service: Cardiovascular;;  . RIGHT/LEFT HEART CATH AND CORONARY ANGIOGRAPHY N/A 05/16/2020   Procedure: RIGHT/LEFT HEART CATH AND CORONARY ANGIOGRAPHY;  Surgeon: Jolaine Artist, MD;  Location: [facility];  Service: Cardiovascular;  Laterality: N/A;  . TOTAL KNEE ARTHROPLASTY Right 06/28/2016   Procedure: TOTAL KNEE ARTHROPLASTY;  Surgeon: Frederik Pear, MD;  Location: [facility];  Service: Orthopedics;  Laterality: Right;     OB History   No obstetric history on file.     Family History  Problem Relation Age of Onset  . Diabetes Mother   . Hypertension Mother   . Transient ischemic attack Mother   . Arthritis Mother   . Heart attack Father 34  . Arthritis Father   . Hypertension Father   . Breast cancer Maternal Aunt   . Arthritis Maternal Aunt     Social  History   Tobacco Use  . Smoking status: Former Smoker    Packs/day: 0.25    Years: 18.00    Pack years: 4.50    Types: Cigarettes    Quit date: 12/27/1968    Years since quitting: 52.1  . Smokeless tobacco: Never Used  . Tobacco comment: smoked [place], up to 1 pp week  Vaping Use  . Vaping Use: Never used  Substance Use Topics  . Alcohol use: Yes    Comment: occ  . Drug use: No    Home Medications Prior to Admission medications   Medication Sig Start Date End Date Taking? Authorizing Provider  acetaminophen (TYLENOL) 500 MG tablet Take 1,000 mg by mouth every 6 (six) hours as needed (pain).   Yes [provider]  amLODipine (NORVASC) 10 MG tablet Take 1 tablet (10 mg total) by mouth daily. 12/18/20  Yes Bensimhon, Shaune Pascal, MD  CALCIUM-MAG-VIT C-VIT D PO Take 1 tablet by mouth daily.    Yes [provider]  carvedilol (COREG) 6.25 MG tablet TAKE 1 TABLET BY MOUTH  TWICE DAILY WITH MEALS Patient taking differently: Take 6.25 mg by mouth 2 (two) times daily with a meal. 12/22/20  Yes Bensimhon, Shaune Pascal, MD  cholecalciferol (VITAMIN D) 1000 units tablet Take 1,000 Units by mouth daily after  supper.   Yes [provider]  clopidogrel (PLAVIX) 75 MG tablet TAKE 1 TABLET BY MOUTH ONCE DAILY WITH BREAKFAST Patient taking differently: Take 75 mg by mouth daily. 12/17/20  Yes Hilty, Nadean Corwin, MD  ELIQUIS 5 MG TABS tablet TAKE 1 TABLET BY MOUTH  TWICE DAILY Patient taking differently: Take 5 mg by mouth 2 (two) times daily. 12/03/20  Yes Bensimhon, Shaune Pascal, MD  ezetimibe (ZETIA) 10 MG tablet Take 1 tablet (10 mg total) by mouth daily. 08/12/20  Yes Hilty, Nadean Corwin, MD  furosemide (LASIX) 80 MG tablet TAKE 1 TABLET BY MOUTH  TWICE DAILY Patient taking differently: Take 80 mg by mouth 2 (two) times daily. 12/22/20  Yes Bensimhon, Shaune Pascal, MD  Insulin Lispro Prot & Lispro (HUMALOG MIX 75/25 KWIKPEN) (75-25) 100 UNIT/ML Kwikpen Inject 36 units into the skin daily with breakfast Patient taking differently: 36 Units in the morning and at bedtime. Inject 36 units into the skin daily with breakfast 12/09/20  Yes Renato Shin, MD  Multiple Vitamin (MULTIVITAMIN WITH MINERALS) TABS tablet Take 1 tablet by mouth daily. Centrum Silver   Yes [provider]  nitroGLYCERIN (NITROSTAT) 0.4 MG SL tablet Place 1 tablet (0.4 mg total) under the tongue every 5 (five) minutes as needed for chest pain. Reported on 01/28/2016 01/03/20  Yes Bensimhon, Shaune Pascal, MD  potassium chloride SA (KLOR-CON) 20 MEQ tablet Take 20 mEq by mouth 2 (two) times daily.   Yes [provider]  empagliflozin (JARDIANCE) 10 MG TABS tablet Take 10 mg by mouth daily before breakfast. Patient not taking: No sig reported 10/26/19   Bensimhon, Shaune Pascal, MD  Evolocumab (REPATHA SURECLICK) 701 MG/ML SOAJ Inject 1 Dose into the skin every 14 (fourteen) days. Patient not taking: No sig reported 12/25/20   Pixie Casino, MD  glucose blood [facility] VERIO) test strip USE TO MONITOR GLUCOSE  LEVELS TWICE DAILY 10/03/20   Renato Shin, MD  Lancets [facility] DELICA PLUS [id number]) [place] USE TO MONITOR GLUCOSE  LEVELS  TWICE DAILY 05/03/20   Renato Shin, MD    Allergies    Levaquin [levofloxacin], Lobster [shellfish allergy], Penicillins, Valsartan, Codeine, Lipitor [  atorvastatin], Oxycodone, Statins, Tramadol, Amlodipine besylate, Crestor [rosuvastatin calcium], and Tramadol hcl  Review of Systems   Review of Systems  Constitutional: Positive for fatigue. Negative for chills, diaphoresis and fever.  HENT: Negative for congestion, rhinorrhea and sneezing.   Eyes: Negative.   Respiratory: Positive for chest tightness and shortness of breath. Negative for cough.   Cardiovascular: Positive for leg swelling. Negative for chest pain.  Gastrointestinal: Negative for abdominal pain, blood in stool, diarrhea, nausea and vomiting.  Genitourinary: Negative for difficulty urinating, flank pain, frequency and hematuria.  Musculoskeletal: Negative for arthralgias and back pain.  Skin: Negative for rash.  Neurological: Negative for dizziness, speech difficulty, weakness, numbness and headaches.    Physical Exam Updated Vital Signs BP (!) 149/79   Pulse 89   Temp 97.7 F (36.5 C) (Oral)   Resp (!) 21   SpO2 98%   Physical Exam Constitutional:      Appearance: She is well-developed and well-nourished.  HENT:     Head: Normocephalic and atraumatic.  Eyes:     Pupils: Pupils are equal, round, and reactive to light.  Cardiovascular:     Rate and Rhythm: Normal rate and regular rhythm.     Heart sounds: Normal heart sounds.  Pulmonary:     Effort: Pulmonary effort is normal. Tachypnea present. No respiratory distress.     Breath sounds: Normal breath sounds. No wheezing or rales.     Comments: Few crackles in the bases Chest:     Chest wall: No tenderness.  Abdominal:     General: Bowel sounds are normal.     Palpations: Abdomen is soft.     Tenderness: There is no abdominal tenderness. There is no guarding or rebound.  Musculoskeletal:        General: Normal range of motion.     Cervical back:  Normal range of motion and neck supple.     Right lower leg: Edema present.     Left lower leg: Edema present.     Comments: 2+ pain edema to lower extremities bilaterally, no calf pain  Lymphadenopathy:     Cervical: No cervical adenopathy.  Skin:    General: Skin is warm and dry.     Findings: No rash.  Neurological:     Mental Status: She is alert and oriented to person, place, and time.  Psychiatric:        Mood and Affect: Mood and affect normal.     ED Results / Procedures / Treatments   Labs (all labs ordered are listed, but only abnormal results are displayed) Labs Reviewed  BASIC METABOLIC PANEL - Abnormal; Notable for the following components:      Result Value   Glucose, Bld 165 (*)    Creatinine, Ser 1.01 (*)    GFR, Estimated 54 (*)    All other components within normal limits  CBC - Abnormal; Notable for the following components:   RBC 5.25 (*)    Hemoglobin 15.7 (*)    HCT 51.5 (*)    All other components within normal limits  BRAIN NATRIURETIC PEPTIDE - Abnormal; Notable for the following components:   B Natriuretic Peptide 171.8 (*)    All other components within normal limits  HEPATIC FUNCTION PANEL - Abnormal; Notable for the following components:   Bilirubin, Direct 0.3 (*)    All other components within normal limits  D-DIMER, QUANTITATIVE (NOT AT [facility]) - Abnormal; Notable for the following components:   D-Dimer, Quant 0.74 (*)  All other components within normal limits  CBG MONITORING, ED - Abnormal; Notable for the following components:   Glucose-Capillary 63 (*)    All other components within normal limits  CBG MONITORING, ED - Abnormal; Notable for the following components:   Glucose-Capillary 126 (*)    All other components within normal limits  CBG MONITORING, ED - Abnormal; Notable for the following components:   Glucose-Capillary 222 (*)    All other components within normal limits  TROPONIN I (HIGH SENSITIVITY) - Abnormal; Notable for  the following components:   Troponin I (High Sensitivity) 20 (*)    All other components within normal limits  SARS CORONAVIRUS 2 BY RT PCR (HOSPITAL ORDER, [place] [facility])  BLOOD GAS, ARTERIAL  TROPONIN I (HIGH SENSITIVITY)    EKG EKG Interpretation  Date/Time:  Saturday January 31 2021 08:59:33 EST Ventricular Rate:  92 PR Interval:    QRS Duration: 126 QT Interval:  371 QTC Calculation: 459 R Axis:   -77 Text Interpretation: Atrial flutter Nonspecific IVCD with LAD LVH with secondary repolarization abnormality Confirmed by Malvin Johns [phone]) on 01/31/2021 9:05:13 AM   Radiology DG Chest 2 View  Result Date: 01/30/2021 CLINICAL DATA:  Shortness of breath. Bilateral ankle swelling. History of CHF. EXAM: CHEST - 2 VIEW COMPARISON:  05/15/2020 FINDINGS: The cardiac silhouette is borderline enlarged. Aortic atherosclerosis is noted. The interstitial markings are mildly increased diffusely compared to the prior study with a few Kerley lines. There are new small bilateral pleural effusions. No pneumothorax is identified. No acute osseous abnormality is seen. IMPRESSION: New mild interstitial edema and small bilateral pleural effusions. Electronically Signed   By: Logan Bores M.D.   On: 01/30/2021 11:07   CT Angio Chest PE W/Cm &/Or Wo Cm  Result Date: 01/31/2021 CLINICAL DATA:  Positive D-dimer. EXAM: CT ANGIOGRAPHY CHEST WITH CONTRAST TECHNIQUE: Multidetector CT imaging of the chest was performed using the standard protocol during bolus administration of intravenous contrast. Multiplanar CT image reconstructions and MIPs were obtained to evaluate the vascular anatomy. CONTRAST:  7mL OMNIPAQUE IOHEXOL 350 MG/ML SOLN COMPARISON:  May 25, 2017. FINDINGS: Cardiovascular: Satisfactory opacification of the pulmonary arteries to the segmental level. No evidence of pulmonary embolism. Mild cardiomegaly is noted. No pericardial effusion. Atherosclerosis of thoracic aorta is  noted without aneurysm formation. Coronary artery calcifications are noted with probable stent seen in the left anterior descending artery. Mediastinum/Nodes: Small sliding-type hiatal hernia is noted. Thyroid gland is unremarkable. 1.4 cm subcarinal adenopathy is noted. 13 mm right hilar adenopathy is noted. 10 mm aortopulmonary window adenopathy is noted. This may be inflammatory in etiology, but neoplasm cannot be excluded. Lungs/Pleura: No pneumothorax or pleural effusion is noted. Emphysematous disease is noted in the upper lobes bilaterally. Mild bilateral posterior basilar subsegmental atelectasis is noted. Upper Abdomen: No acute abnormality. Musculoskeletal: No chest wall abnormality. No acute or significant osseous findings. Review of the MIP images confirms the above findings. IMPRESSION: 1. No definite evidence of pulmonary embolus. 2. Coronary artery calcifications are noted with probable stent seen in the left anterior descending artery. 3. Small sliding-type hiatal hernia. 4. Mediastinal adenopathy is noted which may be inflammatory in etiology, but neoplasm cannot be excluded. Follow-up CT scan in 3 months is recommended to ensure stability or resolution. 5. Emphysema and aortic atherosclerosis. Aortic Atherosclerosis (ICD10-I70.0) and Emphysema (ICD10-J43.9). Electronically Signed   By: Marijo Conception M.D.   On: 01/31/2021 11:49    Procedures Procedures   Medications Ordered  in ED Medications  furosemide (LASIX) injection 40 mg (40 mg Intravenous Given 01/31/21 0811)  iohexol (OMNIPAQUE) 350 MG/ML injection 80 mL (80 mLs Intravenous Contrast Given 01/31/21 1139)    ED Course  I have reviewed the triage vital signs and the nursing notes.  Pertinent labs & imaging results that were available during my care of the patient were reviewed by me and considered in my medical decision making (see chart for details).    MDM Rules/Calculators/A&P                          Patient is a  87 year old female who presents with hypoxia and shortness of breath. Chest x-ray shows evidence of some mild CHF changes. Her Covid test is negative. Her BNP is mildly elevated. Her creatinine is minimally elevated as well. Her EKG does not show any ischemic changes. Her troponin is negative, her second troponin was minimally elevated.. I did do a CT scan of her chest to rule out PE given her hypoxia. This was negative for PE. There was some mediastinal lymphadenopathy will need outpatient follow-up. I did discuss this with the patient. She still requiring 3 L of oxygen via nasal cannula. On my reexam, her oxygen saturations were 87-88 on 3 L and it was turned up to 4 L which she is currently on. She was given Lasix for diuresis. I spoke with the hospitalist to admit the patient for further treatment.  CRITICAL CARE Performed by: Malvin Johns Total critical care time:  57minutes Critical care time was exclusive of separately billable procedures and treating other patients. Critical care was necessary to treat or prevent imminent or life-threatening deterioration. Critical care was time spent personally by me on the following activities: development of treatment plan with patient and/or surrogate as well as nursing, discussions with consultants, evaluation of patient's response to treatment, examination of patient, obtaining history from patient or surrogate, ordering and performing treatments and interventions, ordering and review of laboratory studies, ordering and review of radiographic studies, pulse oximetry and re-evaluation of patient's condition.    Final Clinical Impression(s) / ED Diagnoses Final diagnoses:  Hypoxia  Acute on chronic congestive heart failure, unspecified heart failure type [facility])    Rx / DC Orders ED Discharge Orders    None       Malvin Johns, MD 01/31/21 1435

## 2021-02-01 ENCOUNTER — Inpatient Hospital Stay (HOSPITAL_COMMUNITY): Payer: Medicare Other

## 2021-02-01 DIAGNOSIS — I5033 Acute on chronic diastolic (congestive) heart failure: Secondary | ICD-10-CM

## 2021-02-01 DIAGNOSIS — R609 Edema, unspecified: Secondary | ICD-10-CM | POA: Diagnosis not present

## 2021-02-01 DIAGNOSIS — I10 Essential (primary) hypertension: Secondary | ICD-10-CM

## 2021-02-01 DIAGNOSIS — R0602 Shortness of breath: Secondary | ICD-10-CM | POA: Diagnosis not present

## 2021-02-01 DIAGNOSIS — J9601 Acute respiratory failure with hypoxia: Secondary | ICD-10-CM | POA: Diagnosis not present

## 2021-02-01 DIAGNOSIS — J9602 Acute respiratory failure with hypercapnia: Secondary | ICD-10-CM | POA: Diagnosis not present

## 2021-02-01 DIAGNOSIS — I4811 Longstanding persistent atrial fibrillation: Secondary | ICD-10-CM

## 2021-02-01 DIAGNOSIS — I272 Pulmonary hypertension, unspecified: Secondary | ICD-10-CM | POA: Diagnosis not present

## 2021-02-01 DIAGNOSIS — I480 Paroxysmal atrial fibrillation: Secondary | ICD-10-CM

## 2021-02-01 DIAGNOSIS — I5032 Chronic diastolic (congestive) heart failure: Secondary | ICD-10-CM

## 2021-02-01 DIAGNOSIS — I2510 Atherosclerotic heart disease of native coronary artery without angina pectoris: Secondary | ICD-10-CM

## 2021-02-01 LAB — COMPREHENSIVE METABOLIC PANEL
ALT: 19 U/L (ref 0–44)
AST: 23 U/L (ref 15–41)
Albumin: 3 g/dL — ABNORMAL LOW (ref 3.5–5.0)
Alkaline Phosphatase: 76 U/L (ref 38–126)
Anion gap: 10 (ref 5–15)
BUN: 15 mg/dL (ref 8–23)
CO2: 33 mmol/L — ABNORMAL HIGH (ref 22–32)
Calcium: 9 mg/dL (ref 8.9–10.3)
Chloride: 97 mmol/L — ABNORMAL LOW (ref 98–111)
Creatinine, Ser: 0.91 mg/dL (ref 0.44–1.00)
GFR, Estimated: >60 mL/min (ref >60)
Glucose, Bld: 149 mg/dL — ABNORMAL HIGH (ref 70–99)
Potassium: 4.1 mmol/L (ref 3.5–5.1)
Sodium: 140 mmol/L (ref 135–145)
Total Bilirubin: 1.1 mg/dL (ref 0.3–1.2)
Total Protein: 6.4 g/dL — ABNORMAL LOW (ref 6.5–8.1)

## 2021-02-01 LAB — CBC
HCT: 48.7 % — ABNORMAL HIGH (ref 36.0–46.0)
Hemoglobin: 15.1 g/dL — ABNORMAL HIGH (ref 12.0–15.0)
MCH: 30.4 pg (ref 26.0–34.0)
MCHC: 31 g/dL (ref 30.0–36.0)
MCV: 98.2 fL (ref 80.0–100.0)
Platelets: 165 10*3/uL (ref 150–400)
RBC: 4.96 MIL/uL (ref 3.87–5.11)
RDW: 14.4 % (ref 11.5–15.5)
WBC: 5.3 10*3/uL (ref 4.0–10.5)
nRBC: 0 % (ref 0.0–0.2)

## 2021-02-01 LAB — GLUCOSE, CAPILLARY
Glucose-Capillary: 212 mg/dL — ABNORMAL HIGH (ref 70–99)
Glucose-Capillary: 219 mg/dL — ABNORMAL HIGH (ref 70–99)
Glucose-Capillary: 260 mg/dL — ABNORMAL HIGH (ref 70–99)
Glucose-Capillary: 229 mg/dL — ABNORMAL HIGH (ref 70–99)

## 2021-02-01 MED ORDER — IPRATROPIUM-ALBUTEROL 0.5-2.5 (3) MG/3ML IN SOLN
3.0000 mL | RESPIRATORY_TRACT | Status: DC | PRN
  Administered 2021-02-01 – 2021-02-02 (×2): 3 mL via RESPIRATORY_TRACT
  Filled 2021-02-01 (×2): qty 3

## 2021-02-01 NOTE — Progress Notes (Signed)
PROGRESS NOTE  [name] OFB:[id number] DOB: [birth year] DOA: 01/30/2021 PCP: [name], MD   LOS: 1 day   Brief Narrative / Interim history: This is an 87 year old female with history of chronic combined CHF, CAD, prior MI in 2008, A. fib/flutter on Eliquis, PAD status post right external iliac artery stenting, DM 2, hypertension, hyperlipidemia, COPD, OSA on CPAP who came in with shortness of breath, inability to lay flat and worsening edema.  She follows with cardiology as an outpatient, most recently had a left heart cath in May of last year which showed severe three-vessel obstructive CAD status post PCI of the proximal LCx/OM1 with DES x 1 with IVUS guidance, and PCI of the proximal LAD with orbital atherectomy and DES x 1. She is currently on Plavix 75 mg daily and Eliquis 5 mg twice daily.    Patient tells me that over the last 1 to 2 months she has had progressive dyspnea, she is unable to lay flat and now barely able to take a few steps without having to stop.  She reports compliance with her diuretics however currently does not have a scale and cannot weigh herself.  She used to have 1 but moved into a retirement community and doesn't have it anymore.  She also tells me that the food that she is being given in her retirement community does not adhere to low-sodium diet as it is being cooked for 100s of residents.  In the ED chest x-ray showed concern for interstitial edema and small bilateral pleural effusions, and was hypoxic requiring BiPAP placement  Subjective / 24h Interval events: She continues to feel short of breath just with talking to me, denies any chest pain, no abdominal pain, no nausea or vomiting.  Assessment & Plan: Principal Problem Acute hypoxic respiratory failure due to acute on chronic combined CHF  -patient was admitted to the hospital, hypoxic initially requiring BiPAP.  A 2D echo done 2/5 showed EF 40-45%, LV has global hypokinesis, LVH and mildly reduced  RV function.  The LV function seems to be slightly worse based on the most recent echo in May 2021 -She is on nasal cannula this morning but still somewhat tachypneic.  Started on Lasix, renal function is stable and will continue today. -There is certainly a degree of high salt intake but it seems to be outside of patient's control as the foods are being cooked for her in her retirement community -Continue strict ins and outs, daily weights -Cardiology consulted, she is seeing Dr. Haroldine Laws as an outpatient, appreciate input  Active Problems Coronary artery disease, prior MI/stenting, PAD -No chest pain, no symptoms to suggest active events.  Troponin is mildly elevated likely demand -Continue Plavix  Chronic A. fib/flutter -Continue Coreg, Eliquis  COPD -No wheezing, this appears stable, she is on oxygen but probably due to #1  OSA on CPAP -Continue home CPAP  DM2 -Continue Lantus, sliding scale  CBG (last 3)  Recent Labs    01/31/21 1725 01/31/21 2047 02/01/21 0747  GLUCAP 209* 223* 212*   Hyperlipidemia -Continue home medications  Incidental finding of mediastinal lymphadenopathy on the CT scan -Follow-up CT scan in 3 months  Scheduled Meds: . apixaban  5 mg Oral BID  . carvedilol  6.25 mg Oral BID WC  . cholecalciferol  1,000 Units Oral QPC supper  . clopidogrel  75 mg Oral Daily  . ezetimibe  10 mg Oral Daily  . furosemide  60 mg Intravenous Q12H  . insulin  aspart  0-5 Units Subcutaneous QHS  . insulin aspart  0-9 Units Subcutaneous TID WC  . insulin glargine  15 Units Subcutaneous QHS  . sodium chloride flush  3 mL Intravenous Q12H   Continuous Infusions: . sodium chloride     PRN Meds:.sodium chloride, acetaminophen, nitroGLYCERIN, ondansetron (ZOFRAN) IV, sodium chloride flush  Diet Orders (From admission, onward)    Start     Ordered   02/01/21 0653  Diet Heart Room service appropriate? Yes; Fluid consistency: Thin; Fluid restriction: 1500 mL Fluid   Diet effective now       Comments: Also diabetic diet  Question Answer Comment  Room service appropriate? Yes   Fluid consistency: Thin   Fluid restriction: 1500 mL Fluid      02/01/21 0652          DVT prophylaxis: SCDs Start: 01/31/21 1412 apixaban (ELIQUIS) tablet 5 mg     Code Status: DNR  Family Communication: no family at bedside   Status is: Inpatient  Remains inpatient appropriate because:Inpatient level of care appropriate due to severity of illness   Dispo: The patient is from: Home              Anticipated d/c is to: Home              Anticipated d/c date is: 2 days              Patient currently is not medically stable to d/c.   Difficult to place patient No  Level of care: Progressive  Consultants:  Cardiology   Procedures:  2D echo  Microbiology  None   Antimicrobials: None     Objective: Vitals:   01/31/21 2044 01/31/21 2309 02/01/21 0329 02/01/21 0747  BP: (!) 147/70 (!) 171/75 (!) 170/78 (!) 166/66  Pulse: 87 83 94 96  Resp: 20 (!) 23 (!) 22 (!) 26  Temp: 99 F (37.2 C) 98.8 F (37.1 C) 98.6 F (37 C) 98.8 F (37.1 C)  TempSrc: Oral Oral Oral Oral  SpO2: 92% 98% 95% 93%  Weight: 75.9 kg     Height: 5\' 4"  (1.626 m)       Intake/Output Summary (Last 24 hours) at 02/01/2021 1023 Last data filed at 02/01/2021 0749 Gross per 24 hour  Intake -  Output 1600 ml  Net -1600 ml   Filed Weights   01/31/21 2044  Weight: 75.9 kg    Examination:  Constitutional: In mild distress due to tachypnea Eyes: no scleral icterus ENMT: Mucous membranes are moist.  Neck: normal, supple Respiratory: Faint crackles at the bases, no wheezing, moves air well.  Tachypneic Cardiovascular: Irregular.  1+ pitting LE edema.  Abdomen: non distended, no tenderness. Bowel sounds positive.  Musculoskeletal: no clubbing / cyanosis.  Skin: no rashes Neurologic: CN 2-12 grossly intact. Strength 5/5 in all 4.  Psychiatric: Normal judgment and insight. Alert  and oriented x 3. Normal mood.    Data Reviewed: I have independently reviewed following labs and imaging studies   CBC: Recent Labs  Lab 01/30/21 1037 01/31/21 1225 02/01/21 0105  WBC 4.9  --  5.3  HGB 15.7* 18.4* 15.1*  HCT 51.5* 54.0* 48.7*  MCV 98.1  --  98.2  PLT 174  --  269   Basic Metabolic Panel: Recent Labs  Lab 01/30/21 1037 01/31/21 1225 02/01/21 0105  NA 141 138 140  K 4.6 4.3 4.1  CL 104  --  97*  CO2 29  --  33*  GLUCOSE 165*  --  149*  BUN 19  --  15  CREATININE 1.01*  --  0.91  CALCIUM 9.8  --  9.0   Liver Function Tests: Recent Labs  Lab 01/31/21 0758 02/01/21 0105  AST 33 23  ALT 28 19  ALKPHOS 101 76  BILITOT 1.0 1.1  PROT 7.9 6.4*  ALBUMIN 3.7 3.0*   Coagulation Profile: No results for input(s): INR, PROTIME in the last 168 hours. HbA1C: No results for input(s): HGBA1C in the last 72 hours. CBG: Recent Labs  Lab 01/30/21 1814 01/31/21 0757 01/31/21 1725 01/31/21 2047 02/01/21 0747  GLUCAP 126* 222* 209* 223* 212*    Recent Results (from the past 240 hour(s))  SARS Coronavirus 2 by RT PCR (hospital order, performed in [facility] hospital lab) Nasopharyngeal Nasopharyngeal Swab     Status: None   Collection Time: 01/31/21  2:49 AM   Specimen: Nasopharyngeal Swab  Result Value Ref Range Status   SARS Coronavirus 2 NEGATIVE NEGATIVE Final    Comment: (NOTE) SARS-CoV-2 target nucleic acids are NOT DETECTED.  The SARS-CoV-2 RNA is generally detectable in upper and lower respiratory specimens during the acute phase of infection. The lowest concentration of SARS-CoV-2 viral copies this assay can detect is 250 copies / mL. A negative result does not preclude SARS-CoV-2 infection and should not be used as the sole basis for treatment or other patient management decisions.  A negative result may occur with improper specimen collection / handling, submission of specimen other than nasopharyngeal swab, presence of viral mutation(s)  within the areas targeted by this assay, and inadequate number of viral copies (<250 copies / mL). A negative result must be combined with clinical observations, patient history, and epidemiological information.  Fact Sheet for Patients:   StrictlyIdeas.no  Fact Sheet for Healthcare Providers: BankingDealers.co.za  This test is not yet approved or  cleared by the [place] FDA and has been authorized for detection and/or diagnosis of SARS-CoV-2 by FDA under an Emergency Use Authorization (EUA).  This EUA will remain in effect (meaning this test can be used) for the duration of the COVID-19 declaration under Section 564(b)(1) of the Act, 21 U.S.C. section 360bbb-3(b)(1), unless the authorization is terminated or revoked sooner.  Performed at [facility], [place] [address], [place], [place] [address]      Radiology Studies: CT Angio Chest PE W/Cm &/Or Wo Cm  Result Date: 01/31/2021 CLINICAL DATA:  Positive D-dimer. EXAM: CT ANGIOGRAPHY CHEST WITH CONTRAST TECHNIQUE: Multidetector CT imaging of the chest was performed using the standard protocol during bolus administration of intravenous contrast. Multiplanar CT image reconstructions and MIPs were obtained to evaluate the vascular anatomy. CONTRAST:  5mL OMNIPAQUE IOHEXOL 350 MG/ML SOLN COMPARISON:  May 25, 2017. FINDINGS: Cardiovascular: Satisfactory opacification of the pulmonary arteries to the segmental level. No evidence of pulmonary embolism. Mild cardiomegaly is noted. No pericardial effusion. Atherosclerosis of thoracic aorta is noted without aneurysm formation. Coronary artery calcifications are noted with probable stent seen in the left anterior descending artery. Mediastinum/Nodes: Small sliding-type hiatal hernia is noted. Thyroid gland is unremarkable. 1.4 cm subcarinal adenopathy is noted. 13 mm right hilar adenopathy is noted. 10 mm aortopulmonary window adenopathy is noted.  This may be inflammatory in etiology, but neoplasm cannot be excluded. Lungs/Pleura: No pneumothorax or pleural effusion is noted. Emphysematous disease is noted in the upper lobes bilaterally. Mild bilateral posterior basilar subsegmental atelectasis is noted. Upper Abdomen: No acute abnormality. Musculoskeletal: No chest wall abnormality.  No acute or significant osseous findings. Review of the MIP images confirms the above findings. IMPRESSION: 1. No definite evidence of pulmonary embolus. 2. Coronary artery calcifications are noted with probable stent seen in the left anterior descending artery. 3. Small sliding-type hiatal hernia. 4. Mediastinal adenopathy is noted which may be inflammatory in etiology, but neoplasm cannot be excluded. Follow-up CT scan in 3 months is recommended to ensure stability or resolution. 5. Emphysema and aortic atherosclerosis. Aortic Atherosclerosis (ICD10-I70.0) and Emphysema (ICD10-J43.9). Electronically Signed   By: Marijo Conception M.D.   On: 01/31/2021 11:49   ECHOCARDIOGRAM COMPLETE  Result Date: 01/31/2021    ECHOCARDIOGRAM REPORT   Patient Name:   [name] Date of Exam: 01/31/2021 Medical Rec #:  [id number]         Height:       63.0 in Accession #:    [id number]        Weight:       163.4 lb Date of Birth:  [birth year]        BSA:          1.774 m Patient Age:    18 years          BP:           169/49 mmHg Patient Gender: F                 HR:           53 bpm. Exam Location:  Inpatient Procedure: 2D Echo, Cardiac Doppler and Color Doppler Indications:    I50.23 Acute on chronic systolic (congestive) heart failure  History:        Patient has prior history of Echocardiogram examinations, most                 recent 05/16/2020. Previous Myocardial Infarction, CAD and                 Angina, COPD, Arrythmias:Atrial Fibrillation,                 Signs/Symptoms:Murmur; Risk Factors:Hypertension, Diabetes,                 Dyslipidemia and Sleep Apnea.  Sonographer:     Tiffany Dance Referring Phys: [id number] NA LI IMPRESSIONS  1. Left ventricular ejection fraction, by estimation, is 40 to 45%. The left ventricle has mildly decreased function. The left ventricle demonstrates global hypokinesis. There is moderate left ventricular hypertrophy. Left ventricular diastolic function  could not be evaluated.  2. Right ventricular systolic function is mildly reduced. The right ventricular size is normal.  3. Left atrial size was moderately dilated.  4. The mitral valve is abnormal. Mild mitral valve regurgitation. Moderate mitral annular calcification.  5. The aortic valve is tricuspid. Aortic valve regurgitation is not visualized. Comparison(s): Changes from prior study are noted. 05/16/20: LVEF 50-55%, grade 3 DD. FINDINGS  Left Ventricle: Left ventricular ejection fraction, by estimation, is 40 to 45%. The left ventricle has mildly decreased function. The left ventricle demonstrates global hypokinesis. The left ventricular internal cavity size was normal in size. There is  moderate left ventricular hypertrophy. Left ventricular diastolic function could not be evaluated due to atrial fibrillation. Left ventricular diastolic function could not be evaluated. Right Ventricle: The right ventricular size is normal. No increase in right ventricular wall thickness. Right ventricular systolic function is mildly reduced. Left Atrium: Left atrial size was moderately dilated. Right Atrium: Right atrial size was normal in size. Pericardium:  There is no evidence of pericardial effusion. Mitral Valve: The mitral valve is abnormal. Moderate mitral annular calcification. Mild mitral valve regurgitation, with posteriorly-directed jet. Tricuspid Valve: The tricuspid valve is grossly normal. Tricuspid valve regurgitation is trivial. Aortic Valve: The aortic valve is tricuspid. Aortic valve regurgitation is not visualized. Pulmonic Valve: The pulmonic valve was grossly normal. Pulmonic valve regurgitation is not  visualized. Aorta: The aortic root and ascending aorta are structurally normal, with no evidence of dilitation. IAS/Shunts: No atrial level shunt detected by color flow Doppler.  LEFT VENTRICLE PLAX 2D LVIDd:         3.70 cm LVIDs:         2.90 cm LV PW:         1.20 cm LV IVS:        1.30 cm LVOT diam:     1.90 cm LV SV:         44 LV SV Index:   25 LVOT Area:     2.84 cm  RIGHT VENTRICLE          IVC RV Basal diam:  2.90 cm  IVC diam: 1.80 cm TAPSE (M-mode): 1.6 cm LEFT ATRIUM             Index       RIGHT ATRIUM           Index LA diam:        4.70 cm 2.65 cm/m  RA Area:     18.30 cm LA Vol (A2C):   77.9 ml 43.90 ml/m RA Volume:   51.90 ml  29.25 ml/m LA Vol (A4C):   78.5 ml 44.24 ml/m LA Biplane Vol: 80.4 ml 45.31 ml/m  AORTIC VALVE LVOT Vmax:   85.80 cm/s LVOT Vmean:  60.750 cm/s LVOT VTI:    0.154 m  AORTA Ao Root diam: 3.20 cm MITRAL VALVE MV Area (PHT): 3.71 cm    SHUNTS MV Decel Time: 205 msec    Systemic VTI:  0.15 m MV E velocity: 93.00 cm/s  Systemic Diam: 1.90 cm Lyman Bishop MD Electronically signed by Lyman Bishop MD Signature Date/Time: 01/31/2021/4:42:34 PM    Final     Marzetta Board, MD, PhD [facility]  Between 7 am - 7 pm I am available, please contact me via Amion or Securechat  Between 7 pm - 7 am I am not available, please contact night coverage MD/APP via Amion

## 2021-02-01 NOTE — Progress Notes (Signed)
Lower extremity venous bilateral study completed.   Please see CV Proc for preliminary results.   Maybelle Depaoli, RDMS  

## 2021-02-01 NOTE — Consult Note (Signed)
Cardiology Consultation:   Patient ID: [name] MRN: [id number]; DOB: 03-14-1931  Admit date: 01/30/2021 Date of Consult: 02/01/2021  Primary Care Provider: [name], MD [facility] Cardiologist: Pixie Casino, MD  [place] Electrophysiologist:  None    Patient Profile:   [name] is a 87 y.o. female with a hx of chronic diastolic heart failure (prior systolic heart failure with recovery of EF), coronary artery disease with prior MI in 2008 and more recently in 2021 with PCI to the proximal left circumflex/OM1 and PCI to the proximal LAD with orbital atherectomy and DES.  She also has a history of atrial fibrillation/flutter on Eliquis, PAD status post right external iliac artery stenting, diabetes mellitus 2, hypertension, hyperlipidemia, COPD, OSA on CPAP with pulmonary hypertension who is being seen today for the evaluation of acute heart failure at the request of Dr. [name].  History of Present Illness:   [name] is a very pleasant 87 year old female with a history of prior chronic systolic heart failure with recovery of EF felt to be related to elevated rates, and more recently chronic diastolic heart failure, hypertension, coronary artery disease with PCI to the circumflex and LAD in May for NSTEMI, atrial fibrillation/atrial flutter, pulmonary hypertension with OSA treated with CPAP, peripheral arterial disease with prior right external iliac stent.  She presents to the hospital with 3 to 4 weeks of progressive shortness of breath with exertion, progressing to resting shortness of breath.  She is visibly dyspneic on exam with supplemental O2 in place.  When she speaks O2 saturations dropped to the low 90s.  Last seen by Dr. Haroldine Laws in heart failure clinic December 18, 2020. Patient noted at that visit she was more short of breath with pedal edema and did not feel well, she had increased her Lasix from 80 mg twice daily to 160 mg a.m. and 80 mg p.m.  over the past month prior to that visit.  She was drinking plenty of fluid due to thirst.  Since that visit she has noticed increasing shortness of breath.  Her assisted living facility has been on lockdown for the past 10 days due to COVID-19 infection in the community.  Due to this her meals are brought to her room 3 times a day allowing her no choice for foods.  When she is able to go to the cafeteria she is usually able to make lower sodium choices, however she is eating items such as grilled cheese with tomato soup which she knows has quite a bit of sodium.  Without a choice, it has been difficult for her to have reduced sodium diet.  She likes to exercise at the facility gym but has been unable to do so in the past several weeks due to dyspnea with minimal activity.  She does note some mild lower extremity edema.  Denies abdominal fullness or bloating.  Denies early satiety.  Wears her CPAP nightly.  She has been on Plavix and Eliquis for dual therapy in the setting of recent PCI.  Denies bleeding.  She has known moderate to severe pulmonary hypertension, felt to be multifactorial.  Pulmonary artery pressure 45 mmHg mean, wedge pressure 10 mmHg, PVR 8.8 [place] units.  Dr. Haroldine Laws had a plan to schedule a right heart catheterization at her follow-up visit 2 months from prior.  This was to follow-up pulmonary hypertension after completing cardiac rehab and CPAP therapy for several months.  Echocardiogram obtained this hospitalization.  Interpretation suggests reduction in ejection  fraction.  I have independently reviewed these images side-by-side to the echocardiogram from May 2021.  Both sets of images are suboptimal for the evaluation of the endocardium and wall motion, and Definity contrast should be used for future echocardiograms.  However they appear grossly unchanged on side-by-side comparison.  EF appears grossly preserved on the echocardiogram obtained yesterday.  Past Medical History:   Diagnosis Date  . Anemia    iron defficiency  . Anginal pain ([place])   . Arthritis    "knees, feet, hands; joints" (05/27/2015)  . Asthma   . Atrial fibrillation or flutter    s/p RFCA 7/08;   s/p DCCV in past;   previously on amiodarone;  amio stopped due to lung toxicity  . CAD (coronary artery disease)    s/p NSTEMI tx with BMS to OM1 3/08;  cath 3/08: pOM 99% tx with PCI, pLAD 20%, ? mod stenosis at the AM  . CAP (community acquired pneumonia) 05/24/2015  . CHF (congestive heart failure) ([place])   . Chronic diastolic heart failure (HCC)    echo 11/11:  EF 55-60%, severe LVH, mod LAE, mild MR, mildly increased PASP  . COPD (chronic obstructive pulmonary disease) ([place])   . Degenerative joint disease   . Dystrophy, corneal stromal   . Heart murmur   . HLD (hyperlipidemia)   . HTN (hypertension)    essential nos  . Hypopotassemia    PMH of  . Muscle pain   . Myocardial infarction ([place]) 2008  . OSA on CPAP   . Osteoporosis   . Pneumonia 05/27/2015  . Protein calorie malnutrition ([place])   . Rash and nonspecific skin eruption    both arms,awaiting bio   dr Delman Cheadle  . Seasonal allergies   . Shortness of breath dyspnea   . Type II diabetes mellitus ([place])    Dr Loanne Drilling    Past Surgical History:  Procedure Laterality Date  . A FLUTTER ABLATION     Dr Lovena Le  . ABDOMINAL AORTOGRAM W/LOWER EXTREMITY Bilateral 04/11/2020   Procedure: ABDOMINAL AORTOGRAM W/LOWER EXTREMITY;  Surgeon: Angelia Mould, MD;  Location: [place] [facility];  Service: Cardiovascular;  Laterality: Bilateral;  . CATARACT EXTRACTION W/ INTRAOCULAR LENS  IMPLANT, BILATERAL Bilateral   . CHOLECYSTECTOMY    . COLONOSCOPY  6/07   2 polyps Dr. Kinnie Feil in [place]  . colonoscopy with polypectomy      X 2; [facility]  . CORNEAL TRANSPLANT Bilateral   . CORONARY ANGIOPLASTY WITH STENT PLACEMENT  02/2007   BMS w/Dr Lovena Le  . CORONARY ATHERECTOMY N/A 05/16/2020   Procedure: CORONARY ATHERECTOMY;  Surgeon: Martinique, Peter M,  MD;  Location: [place] [facility];  Service: Cardiovascular;  Laterality: N/A;  . CORONARY BALLOON ANGIOPLASTY N/A 05/16/2020   Procedure: CORONARY BALLOON ANGIOPLASTY;  Surgeon: Martinique, Peter M, MD;  Location: [place] [facility];  Service: Cardiovascular;  Laterality: N/A;  . CORONARY STENT INTERVENTION N/A 05/16/2020   Procedure: CORONARY STENT INTERVENTION;  Surgeon: Martinique, Peter M, MD;  Location: [place] [facility];  Service: Cardiovascular;  Laterality: N/A;  . EYE SURGERY    . gravid 2 para 2    . INTRAVASCULAR ULTRASOUND/IVUS N/A 05/16/2020   Procedure: Intravascular Ultrasound/IVUS;  Surgeon: Martinique, Peter M, MD;  Location: [place] [facility];  Service: Cardiovascular;  Laterality: N/A;  . KNEE CARTILAGE SURGERY Right   . LEFT AND RIGHT HEART CATHETERIZATION WITH CORONARY ANGIOGRAM N/A 05/07/2014   Procedure: LEFT AND RIGHT HEART CATHETERIZATION WITH  CORONARY ANGIOGRAM;  Surgeon: Peter M Martinique, MD;  Location: [facility];  Service: Cardiovascular;  Laterality: N/A;  . PERIPHERAL VASCULAR INTERVENTION  04/11/2020   Procedure: PERIPHERAL VASCULAR INTERVENTION;  Surgeon: Angelia Mould, MD;  Location: [place] [facility];  Service: Cardiovascular;;  . RIGHT/LEFT HEART CATH AND CORONARY ANGIOGRAPHY N/A 05/16/2020   Procedure: RIGHT/LEFT HEART CATH AND CORONARY ANGIOGRAPHY;  Surgeon: Jolaine Artist, MD;  Location: [place] [facility];  Service: Cardiovascular;  Laterality: N/A;  . TOTAL KNEE ARTHROPLASTY Right 06/28/2016   Procedure: TOTAL KNEE ARTHROPLASTY;  Surgeon: Frederik Pear, MD;  Location: [place];  Service: Orthopedics;  Laterality: Right;     Home Medications:  Prior to Admission medications   Medication Sig Start Date End Date Taking? Authorizing Provider  acetaminophen (TYLENOL) 500 MG tablet Take 1,000 mg by mouth every 6 (six) hours as needed (pain).   Yes [provider]  amLODipine (NORVASC) 10 MG tablet Take 1 tablet (10 mg total) by mouth daily. 12/18/20  Yes  Bensimhon, Shaune Pascal, MD  CALCIUM-MAG-VIT C-VIT D PO Take 1 tablet by mouth daily.    Yes [provider]  carvedilol (COREG) 6.25 MG tablet TAKE 1 TABLET BY MOUTH  TWICE DAILY WITH MEALS Patient taking differently: Take 6.25 mg by mouth 2 (two) times daily with a meal. 12/22/20  Yes Bensimhon, Shaune Pascal, MD  cholecalciferol (VITAMIN D) 1000 units tablet Take 1,000 Units by mouth daily after supper.   Yes [provider]  clopidogrel (PLAVIX) 75 MG tablet TAKE 1 TABLET BY MOUTH ONCE DAILY WITH BREAKFAST Patient taking differently: Take 75 mg by mouth daily. 12/17/20  Yes Hilty, Nadean Corwin, MD  ELIQUIS 5 MG TABS tablet TAKE 1 TABLET BY MOUTH  TWICE DAILY Patient taking differently: Take 5 mg by mouth 2 (two) times daily. 12/03/20  Yes Bensimhon, Shaune Pascal, MD  ezetimibe (ZETIA) 10 MG tablet Take 1 tablet (10 mg total) by mouth daily. 08/12/20  Yes Hilty, Nadean Corwin, MD  furosemide (LASIX) 80 MG tablet TAKE 1 TABLET BY MOUTH  TWICE DAILY Patient taking differently: Take 80 mg by mouth 2 (two) times daily. 12/22/20  Yes Bensimhon, Shaune Pascal, MD  Insulin Lispro Prot & Lispro (HUMALOG MIX 75/25 KWIKPEN) (75-25) 100 UNIT/ML Kwikpen Inject 36 units into the skin daily with breakfast Patient taking differently: 36 Units in the morning and at bedtime. Inject 36 units into the skin daily with breakfast 12/09/20  Yes Renato Shin, MD  Multiple Vitamin (MULTIVITAMIN WITH MINERALS) TABS tablet Take 1 tablet by mouth daily. Centrum Silver   Yes [provider]  nitroGLYCERIN (NITROSTAT) 0.4 MG SL tablet Place 1 tablet (0.4 mg total) under the tongue every 5 (five) minutes as needed for chest pain. Reported on 01/28/2016 01/03/20  Yes Bensimhon, Shaune Pascal, MD  potassium chloride SA (KLOR-CON) 20 MEQ tablet Take 20 mEq by mouth 2 (two) times daily.   Yes [provider]  empagliflozin (JARDIANCE) 10 MG TABS tablet Take 10 mg by mouth daily before breakfast. Patient not taking: No sig  reported 10/26/19   Bensimhon, Shaune Pascal, MD  Evolocumab (REPATHA SURECLICK) [id number] MG/ML SOAJ Inject 1 Dose into the skin every 14 (fourteen) days. Patient not taking: No sig reported 12/25/20   Pixie Casino, MD  glucose blood [facility] VERIO) test strip USE TO MONITOR GLUCOSE  LEVELS TWICE DAILY 10/03/20   Renato Shin, MD  Lancets [facility] DELICA PLUS [id number]) MISC USE TO MONITOR GLUCOSE  LEVELS TWICE  DAILY 05/03/20   Renato Shin, MD    Inpatient Medications: Scheduled Meds: . apixaban  5 mg Oral BID  . carvedilol  6.25 mg Oral BID WC  . cholecalciferol  1,000 Units Oral QPC supper  . clopidogrel  75 mg Oral Daily  . ezetimibe  10 mg Oral Daily  . furosemide  60 mg Intravenous Q12H  . insulin aspart  0-5 Units Subcutaneous QHS  . insulin aspart  0-9 Units Subcutaneous TID WC  . insulin glargine  15 Units Subcutaneous QHS  . sodium chloride flush  3 mL Intravenous Q12H   Continuous Infusions: . sodium chloride     PRN Meds: sodium chloride, acetaminophen, nitroGLYCERIN, ondansetron (ZOFRAN) IV, sodium chloride flush  Allergies:    Allergies  Allergen Reactions  . Levaquin [Levofloxacin] Shortness Of Breath and Swelling    angioedema  . Lobster [Shellfish Allergy] Other (See Comments)    angioedema  . Penicillins Rash    Has patient had a PCN reaction causing immediate rash, facial/tongue/throat swelling, SOB or lightheadedness with hypotension: Yes Has patient had a PCN reaction causing severe rash involving mucus membranes or skin necrosis: No Has patient had a PCN reaction that required hospitalization: No Has patient had a PCN reaction occurring within the last 10 years: No If all of the above answers are "NO", then may proceed with Cephalosporin use.  . Valsartan Other (See Comments)    REACTION: angioedema  . Codeine Other (See Comments)    Mental status changes  . Lipitor [Atorvastatin] Other (See Comments)    weakness  . Oxycodone Other (See Comments)     hallucinations  . Statins     Made her too weak  . Tramadol   . Amlodipine Besylate Other (See Comments)    REACTION: tingling in lips & gum edema 7/12: talked to patient, states she is tolerating well  . Crestor [Rosuvastatin Calcium] Rash  . Tramadol Hcl Nausea And Vomiting    Social History:   Social History   Socioeconomic History  . Marital status: Widowed    Spouse name: Not on file  . Number of children: 2  . Years of education: 16  . Highest education level: Bachelor's degree (e.g., BA, AB, BS)  Occupational History  . Occupation: Recruitment consultant Retired  Tobacco Use  . Smoking status: Former Smoker    Packs/day: 0.25    Years: 18.00    Pack years: 4.50    Types: Cigarettes    Quit date: 12/27/1968    Years since quitting: 52.1  . Smokeless tobacco: Never Used  . Tobacco comment: smoked [place], up to 1 pp week  Vaping Use  . Vaping Use: Never used  Substance and Sexual Activity  . Alcohol use: Yes    Comment: occ  . Drug use: No  . Sexual activity: Never  Other Topics Concern  . Not on file  Social History Narrative   Teacher. Widowed. Rarely drinks cafeine.    Social Determinants of Health   Financial Resource Strain: Low Risk   . Difficulty of Paying Living Expenses: Not very hard  Food Insecurity: Not on file  Transportation Needs: Not on file  Physical Activity: Not on file  Stress: Not on file  Social Connections: Not on file  Intimate Partner Violence: Not on file    Family History:    Family History  Problem Relation Age of Onset  . Diabetes Mother   . Hypertension Mother   . Transient ischemic attack Mother   .  Arthritis Mother   . Heart attack Father 28  . Arthritis Father   . Hypertension Father   . Breast cancer Maternal Aunt   . Arthritis Maternal Aunt      ROS:  Please see the history of present illness.   All other ROS reviewed and negative.     Physical Exam/Data:   Vitals:   01/31/21 2044 01/31/21 2309 02/01/21 0329  02/01/21 0747  BP: (!) 147/70 (!) 171/75 (!) 170/78 (!) 166/66  Pulse: 87 83 94 96  Resp: 20 (!) 23 (!) 22 (!) 26  Temp: 99 F (37.2 C) 98.8 F (37.1 C) 98.6 F (37 C) 98.8 F (37.1 C)  TempSrc: Oral Oral Oral Oral  SpO2: 92% 98% 95% 93%  Weight: 75.9 kg     Height: 5\' 4"  (1.626 m)       Intake/Output Summary (Last 24 hours) at 02/01/2021 1137 Last data filed at 02/01/2021 0749 Gross per 24 hour  Intake -  Output 1600 ml  Net -1600 ml   Last 3 Weights 01/31/2021 12/18/2020 12/02/2020  Weight (lbs) 167 lb 5.3 oz 163 lb 6.4 oz 162 lb 9.6 oz  Weight (kg) 75.9 kg 74.118 kg 73.755 kg     Body mass index is 28.72 kg/m.  General:  Well nourished, well developed, dyspneic HEENT: normal Neck: EJ best seen, JVP elevated to mid 1/3rd of neck at 45 deg supine Vascular: No carotid bruits Cardiac:  normal S1, S2; irregular rhythm, normal rate; no murmur  Lungs: crackles in bilateral bases Abd: soft, nontender, no hepatomegaly  Ext: trace ankle and pedal edema Musculoskeletal:  No deformities, BUE and BLE strength normal and equal Skin: warm and dry  Neuro:  Grossly nonfocal Psych:  Normal affect   EKG:  The EKG was personally reviewed and demonstrates:  Atrial flutter, IVCD Telemetry:  Telemetry was personally reviewed and demonstrates:  Atrial flutter with variable conduction  Relevant CV Studies: Echo this admission: I have independently reviewed the study EF approximately 50%, not significantly changed from the prior study in May 2021, RV function is mildly reduced and mildly enlarged.  Trivial MR.  Laboratory Data:  High Sensitivity Troponin:   Recent Labs  Lab 01/31/21 0758 01/31/21 0950 01/31/21 1400 01/31/21 1759  TROPONINIHS 17 20* 20* 20*     Chemistry Recent Labs  Lab 01/30/21 1037 01/31/21 1225 02/01/21 0105  NA 141 138 140  K 4.6 4.3 4.1  CL 104  --  97*  CO2 29  --  33*  GLUCOSE 165*  --  149*  BUN 19  --  15  CREATININE 1.01*  --  0.91  CALCIUM 9.8  --   9.0  GFRNONAA 54*  --  >60  ANIONGAP 8  --  10    Recent Labs  Lab 01/31/21 0758 02/01/21 0105  PROT 7.9 6.4*  ALBUMIN 3.7 3.0*  AST 33 23  ALT 28 19  ALKPHOS 101 76  BILITOT 1.0 1.1   Hematology Recent Labs  Lab 01/30/21 1037 01/31/21 1225 02/01/21 0105  WBC 4.9  --  5.3  RBC 5.25*  --  4.96  HGB 15.7* 18.4* 15.1*  HCT 51.5* 54.0* 48.7*  MCV 98.1  --  98.2  MCH 29.9  --  30.4  MCHC 30.5  --  31.0  RDW 14.6  --  14.4  PLT 174  --  165   BNP Recent Labs  Lab 01/30/21 1037  BNP 171.8*    DDimer  Recent  Labs  Lab 01/31/21 0950  DDIMER 0.74*     Radiology/Studies:  DG Chest 2 View  Result Date: 01/30/2021 CLINICAL DATA:  Shortness of breath. Bilateral ankle swelling. History of CHF. EXAM: CHEST - 2 VIEW COMPARISON:  05/15/2020 FINDINGS: The cardiac silhouette is borderline enlarged. Aortic atherosclerosis is noted. The interstitial markings are mildly increased diffusely compared to the prior study with a few Kerley lines. There are new small bilateral pleural effusions. No pneumothorax is identified. No acute osseous abnormality is seen. IMPRESSION: New mild interstitial edema and small bilateral pleural effusions. Electronically Signed   By: Logan Bores M.D.   On: 01/30/2021 11:07   CT Angio Chest PE W/Cm &/Or Wo Cm  Result Date: 01/31/2021 CLINICAL DATA:  Positive D-dimer. EXAM: CT ANGIOGRAPHY CHEST WITH CONTRAST TECHNIQUE: Multidetector CT imaging of the chest was performed using the standard protocol during bolus administration of intravenous contrast. Multiplanar CT image reconstructions and MIPs were obtained to evaluate the vascular anatomy. CONTRAST:  84mL OMNIPAQUE IOHEXOL 350 MG/ML SOLN COMPARISON:  May 25, 2017. FINDINGS: Cardiovascular: Satisfactory opacification of the pulmonary arteries to the segmental level. No evidence of pulmonary embolism. Mild cardiomegaly is noted. No pericardial effusion. Atherosclerosis of thoracic aorta is noted without  aneurysm formation. Coronary artery calcifications are noted with probable stent seen in the left anterior descending artery. Mediastinum/Nodes: Small sliding-type hiatal hernia is noted. Thyroid gland is unremarkable. 1.4 cm subcarinal adenopathy is noted. 13 mm right hilar adenopathy is noted. 10 mm aortopulmonary window adenopathy is noted. This may be inflammatory in etiology, but neoplasm cannot be excluded. Lungs/Pleura: No pneumothorax or pleural effusion is noted. Emphysematous disease is noted in the upper lobes bilaterally. Mild bilateral posterior basilar subsegmental atelectasis is noted. Upper Abdomen: No acute abnormality. Musculoskeletal: No chest wall abnormality. No acute or significant osseous findings. Review of the MIP images confirms the above findings. IMPRESSION: 1. No definite evidence of pulmonary embolus. 2. Coronary artery calcifications are noted with probable stent seen in the left anterior descending artery. 3. Small sliding-type hiatal hernia. 4. Mediastinal adenopathy is noted which may be inflammatory in etiology, but neoplasm cannot be excluded. Follow-up CT scan in 3 months is recommended to ensure stability or resolution. 5. Emphysema and aortic atherosclerosis. Aortic Atherosclerosis (ICD10-I70.0) and Emphysema (ICD10-J43.9). Electronically Signed   By: Marijo Conception M.D.   On: 01/31/2021 11:49   ECHOCARDIOGRAM COMPLETE  Result Date: 01/31/2021    ECHOCARDIOGRAM REPORT   Patient Name:   [name] Date of Exam: 01/31/2021 Medical Rec #:  [id number]         Height:       63.0 in Accession #:    [id number]        Weight:       163.4 lb Date of Birth:  11/10/1931        BSA:          1.774 m Patient Age:    68 years          BP:           169/49 mmHg Patient Gender: F                 HR:           53 bpm. Exam Location:  Inpatient Procedure: 2D Echo, Cardiac Doppler and Color Doppler Indications:    I50.23 Acute on chronic systolic (congestive) heart failure  History:         Patient has prior history of  Echocardiogram examinations, most                 recent 05/16/2020. Previous Myocardial Infarction, CAD and                 Angina, COPD, Arrythmias:Atrial Fibrillation,                 Signs/Symptoms:Murmur; Risk Factors:Hypertension, Diabetes,                 Dyslipidemia and Sleep Apnea.  Sonographer:    Tiffany Dance Referring Phys: [id number] NA LI IMPRESSIONS  1. Left ventricular ejection fraction, by estimation, is 40 to 45%. The left ventricle has mildly decreased function. The left ventricle demonstrates global hypokinesis. There is moderate left ventricular hypertrophy. Left ventricular diastolic function  could not be evaluated.  2. Right ventricular systolic function is mildly reduced. The right ventricular size is normal.  3. Left atrial size was moderately dilated.  4. The mitral valve is abnormal. Mild mitral valve regurgitation. Moderate mitral annular calcification.  5. The aortic valve is tricuspid. Aortic valve regurgitation is not visualized. Comparison(s): Changes from prior study are noted. 05/16/20: LVEF 50-55%, grade 3 DD. FINDINGS  Left Ventricle: Left ventricular ejection fraction, by estimation, is 40 to 45%. The left ventricle has mildly decreased function. The left ventricle demonstrates global hypokinesis. The left ventricular internal cavity size was normal in size. There is  moderate left ventricular hypertrophy. Left ventricular diastolic function could not be evaluated due to atrial fibrillation. Left ventricular diastolic function could not be evaluated. Right Ventricle: The right ventricular size is normal. No increase in right ventricular wall thickness. Right ventricular systolic function is mildly reduced. Left Atrium: Left atrial size was moderately dilated. Right Atrium: Right atrial size was normal in size. Pericardium: There is no evidence of pericardial effusion. Mitral Valve: The mitral valve is abnormal. Moderate mitral annular calcification. Mild  mitral valve regurgitation, with posteriorly-directed jet. Tricuspid Valve: The tricuspid valve is grossly normal. Tricuspid valve regurgitation is trivial. Aortic Valve: The aortic valve is tricuspid. Aortic valve regurgitation is not visualized. Pulmonic Valve: The pulmonic valve was grossly normal. Pulmonic valve regurgitation is not visualized. Aorta: The aortic root and ascending aorta are structurally normal, with no evidence of dilitation. IAS/Shunts: No atrial level shunt detected by color flow Doppler.  LEFT VENTRICLE PLAX 2D LVIDd:         3.70 cm LVIDs:         2.90 cm LV PW:         1.20 cm LV IVS:        1.30 cm LVOT diam:     1.90 cm LV SV:         44 LV SV Index:   25 LVOT Area:     2.84 cm  RIGHT VENTRICLE          IVC RV Basal diam:  2.90 cm  IVC diam: 1.80 cm TAPSE (M-mode): 1.6 cm LEFT ATRIUM             Index       RIGHT ATRIUM           Index LA diam:        4.70 cm 2.65 cm/m  RA Area:     18.30 cm LA Vol (A2C):   77.9 ml 43.90 ml/m RA Volume:   51.90 ml  29.25 ml/m LA Vol (A4C):   78.5 ml 44.24 ml/m LA Biplane Vol: 80.4 ml 45.31 ml/m  AORTIC VALVE LVOT Vmax:   85.80 cm/s LVOT Vmean:  60.750 cm/s LVOT VTI:    0.154 m  AORTA Ao Root diam: 3.20 cm MITRAL VALVE MV Area (PHT): 3.71 cm    SHUNTS MV Decel Time: 205 msec    Systemic VTI:  0.15 m MV E velocity: 93.00 cm/s  Systemic Diam: 1.90 cm Lyman Bishop MD Electronically signed by Lyman Bishop MD Signature Date/Time: 01/31/2021/4:42:34 PM    Final      Assessment and Plan:   Principal Problem:   Acute respiratory failure with hypoxia and hypercapnia ([place]) Active Problems:   HYPERLIPIDEMIA   HTN (hypertension)   Coronary atherosclerosis   ATRIAL FIBRILLATION   Atrial flutter (HCC)   Hyperthyroidism   Pulmonary HTN (HCC)   Chronic diastolic heart failure (HCC)   OSA    COPD GOLD I    Chronic kidney disease (CKD)   PAD (peripheral artery disease) (HCC)   Acute on chronic diastolic CHF (congestive heart failure) (HCC)    Elevated troponin   Type 2 diabetes mellitus with hyperlipidemia (HCC)  She has a history of diastolic heart failure, now with acute on chronic diastolic heart failure.  On review of echocardiogram EF appears grossly preserved, and likely no significant change from the prior echocardiogram EF, may be slightly reduced from prior.  Acute on chronic diastolic heart failure-she is visibly dyspneic on exam and requires diuresis for optimization.  Agree with what has already been started furosemide 60 mg IV twice daily.  BNP 171.  Monitor for response and improvement in symptoms.  Continue potassium supplementation through IV diuresis. -Not on Entresto due to history of angioedema with ARB -Continue carvedilol 6.25 mg twice daily -She is on Jardiance 5 mg daily (unable to tolerate the 10 mg dose per Dr. Clayborne Dana last note) -Not currently on spironolactone but could consider if needed for elevated blood pressure.  CAD-status post PCI.  For CAD continue Plavix and apixaban, no bleeding events have occurred.  She continues Repatha and Zetia.  Her troponin is borderline elevated and flat this admission.  Unlikely to represent ACS, more likely demand in the setting of heart failure.  Pulmonary hypertension, multifactorial-plan was for right heart catheterization at 3-month follow-up from her last visit in heart failure clinic.  I think when she is better optimized from a volume standpoint in hospital, would consider inpatient right heart catheterization for evaluation of pulmonary pressures.  Would involve Dr. Haroldine Laws at that point, if no urgency could consider outpatient right heart cath.  Paroxysmal atrial fibrillation/flutter-she appears to be in atrial flutter on telemetry and ECG.  Rate controlled.  Continue Eliquis.  Hypertension-blood pressure mildly elevated today.  She takes amlodipine at home, this can be added for elevated blood pressures.  Continue carvedilol.   Risk Assessment/Risk Scores:         New York Heart Association (NYHA) Functional Class NYHA Class III  CHA2DS2-VASc Score = 7  This indicates a 11.2% annual risk of stroke. The patient's score is based upon: CHF History: Yes HTN History: Yes Diabetes History: Yes Stroke History: No Vascular Disease History: Yes      For questions or updates, please contact [place] Please consult [email or web address] for contact info under    Signed, Elouise Munroe, MD  02/01/2021 11:37 AM

## 2021-02-02 DIAGNOSIS — J9602 Acute respiratory failure with hypercapnia: Secondary | ICD-10-CM | POA: Diagnosis not present

## 2021-02-02 DIAGNOSIS — J9601 Acute respiratory failure with hypoxia: Secondary | ICD-10-CM | POA: Diagnosis not present

## 2021-02-02 DIAGNOSIS — I483 Typical atrial flutter: Secondary | ICD-10-CM

## 2021-02-02 LAB — COMPREHENSIVE METABOLIC PANEL
ALT: 15 U/L (ref 0–44)
AST: 23 U/L (ref 15–41)
Albumin: 2.8 g/dL — ABNORMAL LOW (ref 3.5–5.0)
Alkaline Phosphatase: 72 U/L (ref 38–126)
Anion gap: 9 (ref 5–15)
BUN: 19 mg/dL (ref 8–23)
CO2: 32 mmol/L (ref 22–32)
Calcium: 8.9 mg/dL (ref 8.9–10.3)
Chloride: 95 mmol/L — ABNORMAL LOW (ref 98–111)
Creatinine, Ser: 0.97 mg/dL (ref 0.44–1.00)
GFR, Estimated: 57 mL/min — ABNORMAL LOW (ref >60)
Glucose, Bld: 138 mg/dL — ABNORMAL HIGH (ref 70–99)
Potassium: 4.2 mmol/L (ref 3.5–5.1)
Sodium: 136 mmol/L (ref 135–145)
Total Bilirubin: 1.3 mg/dL — ABNORMAL HIGH (ref 0.3–1.2)
Total Protein: 6 g/dL — ABNORMAL LOW (ref 6.5–8.1)

## 2021-02-02 LAB — CBC
HCT: 46.1 % — ABNORMAL HIGH (ref 36.0–46.0)
Hemoglobin: 14.1 g/dL (ref 12.0–15.0)
MCH: 30 pg (ref 26.0–34.0)
MCHC: 30.6 g/dL (ref 30.0–36.0)
MCV: 98.1 fL (ref 80.0–100.0)
Platelets: 141 10*3/uL — ABNORMAL LOW (ref 150–400)
RBC: 4.7 MIL/uL (ref 3.87–5.11)
RDW: 13.7 % (ref 11.5–15.5)
WBC: 5.2 10*3/uL (ref 4.0–10.5)
nRBC: 0 % (ref 0.0–0.2)

## 2021-02-02 LAB — GLUCOSE, CAPILLARY
Glucose-Capillary: 197 mg/dL — ABNORMAL HIGH (ref 70–99)
Glucose-Capillary: 227 mg/dL — ABNORMAL HIGH (ref 70–99)
Glucose-Capillary: 196 mg/dL — ABNORMAL HIGH (ref 70–99)
Glucose-Capillary: 259 mg/dL — ABNORMAL HIGH (ref 70–99)

## 2021-02-02 MED ORDER — FUROSEMIDE 10 MG/ML IJ SOLN
80.0000 mg | Freq: Two times a day (BID) | INTRAMUSCULAR | Status: AC
  Administered 2021-02-02 – 2021-02-05 (×6): 80 mg via INTRAVENOUS
  Filled 2021-02-02 (×6): qty 8

## 2021-02-02 MED ORDER — TRAZODONE HCL 50 MG PO TABS
25.0000 mg | ORAL_TABLET | Freq: Every evening | ORAL | Status: DC | PRN
  Administered 2021-02-02 – 2021-02-04 (×3): 25 mg via ORAL
  Filled 2021-02-02 (×3): qty 1

## 2021-02-02 NOTE — Progress Notes (Signed)
PROGRESS NOTE  [name] REAVES VPX:[id number] DOB: 11/29/1931 DOA: 01/30/2021 PCP: [name], MD   LOS: 2 days   Brief Narrative / Interim history: This is an 87 year old female with history of chronic combined CHF, CAD, prior MI in 2008, A. fib/flutter on Eliquis, PAD status post right external iliac artery stenting, DM 2, hypertension, hyperlipidemia, COPD, OSA on CPAP who came in with shortness of breath, inability to lay flat and worsening edema.  She follows with cardiology as an outpatient, most recently had a left heart cath in May of last year which showed severe three-vessel obstructive CAD status post PCI of the proximal LCx/OM1 with DES x 1 with IVUS guidance, and PCI of the proximal LAD with orbital atherectomy and DES x 1. She is currently on Plavix 75 mg daily and Eliquis 5 mg twice daily.    Patient tells me that over the last 1 to 2 months she has had progressive dyspnea, she is unable to lay flat and now barely able to take a few steps without having to stop.  She reports compliance with her diuretics however currently does not have a scale and cannot weigh herself.  She used to have 1 but moved into a retirement community and doesn't have it anymore.  She also tells me that the food that she is being given in her retirement community does not adhere to low-sodium diet as it is being cooked for 100s of residents.  In the ED chest x-ray showed concern for interstitial edema and small bilateral pleural effusions, and was hypoxic requiring BiPAP placement  Subjective / 24h Interval events: She does not appreciate any improvement. Still feels very short of breath and unable to lay flat. Has not slept much.  Assessment & Plan: Principal Problem Acute hypoxic respiratory failure due to acute on chronic combined CHF  -patient was admitted to the hospital, hypoxic initially requiring BiPAP.  A 2D echo done 2/5 showed EF 40-45%, LV has global hypokinesis, LVH and mildly reduced RV  function.  The LV function seems to be slightly worse based on the most recent echo in May 2021 -She is on nasal cannula this morning but still somewhat tachypneic.  Started on Lasix, renal function is stable and will continue today. Increase to 80 mg given not significant response. Weight has remained stable -There is certainly a degree of high salt intake but it seems to be outside of patient's control as the foods are being cooked for her in her retirement community -Continue strict ins and outs, daily weights -Cardiology following, considering a right heart cath once her volume status is better  Active Problems Coronary artery disease, prior MI/stenting, PAD -No chest pain, no symptoms to suggest active events.  Troponin is mildly elevated likely demand -Continue Plavix  Chronic A. fib/flutter -Continue Coreg, Eliquis  COPD -No wheezing, this appears stable, she is on oxygen but probably due to #1  OSA on CPAP -Continue home CPAP  DM2 -Continue Lantus, sliding scale  CBG (last 3)  Recent Labs    02/01/21 1550 02/01/21 2030 02/02/21 0801  GLUCAP 260* 229* 197*   Hyperlipidemia -Continue home medications  Incidental finding of mediastinal lymphadenopathy on the CT scan -Follow-up CT scan in 3 months  Scheduled Meds: . apixaban  5 mg Oral BID  . carvedilol  6.25 mg Oral BID WC  . cholecalciferol  1,000 Units Oral QPC supper  . clopidogrel  75 mg Oral Daily  . ezetimibe  10 mg Oral Daily  .  furosemide  80 mg Intravenous Q12H  . insulin aspart  0-5 Units Subcutaneous QHS  . insulin aspart  0-9 Units Subcutaneous TID WC  . insulin glargine  15 Units Subcutaneous QHS  . sodium chloride flush  3 mL Intravenous Q12H   Continuous Infusions: . sodium chloride     PRN Meds:.sodium chloride, acetaminophen, ipratropium-albuterol, nitroGLYCERIN, ondansetron (ZOFRAN) IV, sodium chloride flush  Diet Orders (From admission, onward)    Start     Ordered   02/01/21 1200  Diet  2 gram sodium Room service appropriate? Yes; Fluid consistency: Thin  Diet effective now       Question Answer Comment  Room service appropriate? Yes   Fluid consistency: Thin      02/01/21 1200          DVT prophylaxis: SCDs Start: 01/31/21 1412 apixaban (ELIQUIS) tablet 5 mg     Code Status: DNR  Family Communication: no family at bedside   Status is: Inpatient  Remains inpatient appropriate because:Inpatient level of care appropriate due to severity of illness  Dispo: The patient is from: Home              Anticipated d/c is to: Home              Anticipated d/c date is: 2 days              Patient currently is not medically stable to d/c.   Difficult to place patient No  Level of care: Progressive  Consultants:  Cardiology   Procedures:  2D echo  Microbiology  None   Antimicrobials: None     Objective: Vitals:   02/02/21 0525 02/02/21 0752 02/02/21 0800 02/02/21 0802  BP: (!) 147/78  (!) 147/59   Pulse: 86   67  Resp: (!) 29 17  17   Temp: 99 F (37.2 C)     TempSrc: Axillary     SpO2: 95%   92%  Weight:    75.9 kg  Height:       No intake or output data in the 24 hours ending 02/02/21 0937 Filed Weights   01/31/21 2044 02/02/21 0802  Weight: 75.9 kg 75.9 kg    Examination:  Constitutional: Appears uncomfortable, tachypneic Eyes: No scleral icterus ENMT: Moist mucous membranes Neck: normal, supple Respiratory: Fine crackles at the bases, no wheezing, moves air well Cardiovascular: Irregular, 1+ pitting edema Abdomen: soft, nt, nd, bs+ Musculoskeletal: no clubbing / cyanosis.  Skin: no rashes Neurologic: non focal, equal strength    Data Reviewed: I have independently reviewed following labs and imaging studies   CBC: Recent Labs  Lab 01/30/21 1037 01/31/21 1225 02/01/21 0105 02/02/21 0059  WBC 4.9  --  5.3 5.2  HGB 15.7* 18.4* 15.1* 14.1  HCT 51.5* 54.0* 48.7* 46.1*  MCV 98.1  --  98.2 98.1  PLT 174  --  165 141*   Basic  Metabolic Panel: Recent Labs  Lab 01/30/21 1037 01/31/21 1225 02/01/21 0105 02/02/21 0059  NA 141 138 140 136  K 4.6 4.3 4.1 4.2  CL 104  --  97* 95*  CO2 29  --  33* 32  GLUCOSE 165*  --  149* 138*  BUN 19  --  15 19  CREATININE 1.01*  --  0.91 0.97  CALCIUM 9.8  --  9.0 8.9   Liver Function Tests: Recent Labs  Lab 01/31/21 0758 02/01/21 0105 02/02/21 0059  AST 33 23 23  ALT 28 19 15  ALKPHOS 101 76 72  BILITOT 1.0 1.1 1.3*  PROT 7.9 6.4* 6.0*  ALBUMIN 3.7 3.0* 2.8*   Coagulation Profile: No results for input(s): INR, PROTIME in the last 168 hours. HbA1C: No results for input(s): HGBA1C in the last 72 hours. CBG: Recent Labs  Lab 02/01/21 0747 02/01/21 1207 02/01/21 1550 02/01/21 2030 02/02/21 0801  GLUCAP 212* 219* 260* 229* 197*    Recent Results (from the past 240 hour(s))  SARS Coronavirus 2 by RT PCR (hospital order, performed in [name] hospital lab) Nasopharyngeal Nasopharyngeal Swab     Status: None   Collection Time: 01/31/21  2:49 AM   Specimen: Nasopharyngeal Swab  Result Value Ref Range Status   SARS Coronavirus 2 NEGATIVE NEGATIVE Final    Comment: (NOTE) SARS-CoV-2 target nucleic acids are NOT DETECTED.  The SARS-CoV-2 RNA is generally detectable in upper and lower respiratory specimens during the acute phase of infection. The lowest concentration of SARS-CoV-2 viral copies this assay can detect is 250 copies / mL. A negative result does not preclude SARS-CoV-2 infection and should not be used as the sole basis for treatment or other patient management decisions.  A negative result may occur with improper specimen collection / handling, submission of specimen other than nasopharyngeal swab, presence of viral mutation(s) within the areas targeted by this assay, and inadequate number of viral copies (<250 copies / mL). A negative result must be combined with clinical observations, patient history, and epidemiological information.  Fact  Sheet for Patients:   StrictlyIdeas.no  Fact Sheet for Healthcare Providers: BankingDealers.co.za  This test is not yet approved or  cleared by the [place] FDA and has been authorized for detection and/or diagnosis of SARS-CoV-2 by FDA under an Emergency Use Authorization (EUA).  This EUA will remain in effect (meaning this test can be used) for the duration of the COVID-19 declaration under Section 564(b)(1) of the Act, 21 U.S.C. section 360bbb-3(b)(1), unless the authorization is terminated or revoked sooner.  Performed at [facility], [place] [address] [place], [place] [address]      Radiology Studies: VAS [place] LOWER EXTREMITY VENOUS (DVT)  Result Date: 02/01/2021  Lower Venous DVT Study Indications: Edema, and SOB.  Risk Factors: Extensive history of peripheral arterial disease. Anticoagulation: Eliquis. Comparison Study: Performing Technologist: Darlin Coco RDMS  Examination Guidelines: A complete evaluation includes B-mode imaging, spectral Doppler, color Doppler, and power Doppler as needed of all accessible portions of each vessel. Bilateral testing is considered an integral part of a complete examination. Limited examinations for reoccurring indications may be performed as noted. The reflux portion of the exam is performed with the patient in reverse Trendelenburg.  +---------+---------------+---------+-----------+----------+--------------+ RIGHT    CompressibilityPhasicitySpontaneityPropertiesThrombus Aging +---------+---------------+---------+-----------+----------+--------------+ CFV      Full           Yes      Yes                                 +---------+---------------+---------+-----------+----------+--------------+ SFJ      Full                                                        +---------+---------------+---------+-----------+----------+--------------+ FV Prox  Full                                                         +---------+---------------+---------+-----------+----------+--------------+  FV Mid   Full                                                        +---------+---------------+---------+-----------+----------+--------------+ FV DistalFull                                                        +---------+---------------+---------+-----------+----------+--------------+ PFV      Full                                                        +---------+---------------+---------+-----------+----------+--------------+ POP      Full           Yes      Yes                                 +---------+---------------+---------+-----------+----------+--------------+ PTV      Full                                                        +---------+---------------+---------+-----------+----------+--------------+ PERO     Full                                                        +---------+---------------+---------+-----------+----------+--------------+   +---------+---------------+---------+-----------+----------+--------------+ LEFT     CompressibilityPhasicitySpontaneityPropertiesThrombus Aging +---------+---------------+---------+-----------+----------+--------------+ CFV      Full           Yes      Yes                                 +---------+---------------+---------+-----------+----------+--------------+ SFJ      Full                                                        +---------+---------------+---------+-----------+----------+--------------+ FV Prox  Full                                                        +---------+---------------+---------+-----------+----------+--------------+ FV Mid   Full                                                        +---------+---------------+---------+-----------+----------+--------------+  FV DistalFull                                                         +---------+---------------+---------+-----------+----------+--------------+ PFV      Full                                                        +---------+---------------+---------+-----------+----------+--------------+ POP      Full           Yes      Yes                                 +---------+---------------+---------+-----------+----------+--------------+ PTV      Full                                                        +---------+---------------+---------+-----------+----------+--------------+ PERO     Full                                                        +---------+---------------+---------+-----------+----------+--------------+     Summary: RIGHT: - There is no evidence of deep vein thrombosis in the lower extremity.  - No cystic structure found in the popliteal fossa.  LEFT: - There is no evidence of deep vein thrombosis in the lower extremity.  - No cystic structure found in the popliteal fossa.  *See table(s) above for measurements and observations. Electronically signed by Monica Martinez MD on 02/01/2021 at 7:54:58 PM.    Final     Marzetta Board, MD, PhD [facility]  Between 7 am - 7 pm I am available, please contact me via Amion or Securechat  Between 7 pm - 7 am I am not available, please contact night coverage MD/APP via Amion

## 2021-02-02 NOTE — Progress Notes (Signed)
Progress Note  Patient Name: [name] Date of Encounter: 02/02/2021  [facility] Cardiologist: Pixie Casino, MD   Subjective   Feeling a little bit better but still not at baseline.  Sitting up in chair feels more comfortable from a breathing perspective.  Still has some orthopnea.  She states that she is up 7 pounds in her weight.  Inpatient Medications    Scheduled Meds: . apixaban  5 mg Oral BID  . carvedilol  6.25 mg Oral BID WC  . cholecalciferol  1,000 Units Oral QPC supper  . clopidogrel  75 mg Oral Daily  . ezetimibe  10 mg Oral Daily  . furosemide  60 mg Intravenous Q12H  . insulin aspart  0-5 Units Subcutaneous QHS  . insulin aspart  0-9 Units Subcutaneous TID WC  . insulin glargine  15 Units Subcutaneous QHS  . sodium chloride flush  3 mL Intravenous Q12H   Continuous Infusions: . sodium chloride     PRN Meds: sodium chloride, acetaminophen, ipratropium-albuterol, nitroGLYCERIN, ondansetron (ZOFRAN) IV, sodium chloride flush   Vital Signs    Vitals:   02/02/21 0525 02/02/21 0752 02/02/21 0800 02/02/21 0802  BP: (!) 147/78  (!) 147/59   Pulse: 86   67  Resp: (!) 29 17  17   Temp: 99 F (37.2 C)     TempSrc: Axillary     SpO2: 95%   92%  Weight:    75.9 kg  Height:       No intake or output data in the 24 hours ending 02/02/21 0921 Last 3 Weights 02/02/2021 01/31/2021 12/18/2020  Weight (lbs) 167 lb 6.4 oz 167 lb 5.3 oz 163 lb 6.4 oz  Weight (kg) 75.932 kg 75.9 kg 74.118 kg      Telemetry    Atrial flutter with variable conduction- Personally Reviewed  ECG    No new- Personally Reviewed  Physical Exam   GEN: No acute distress.   Neck: No JVD Cardiac: RRR mild irregularity at times, no murmurs, rubs, or gallops.  Respiratory: Clear to auscultation bilaterally. GI: Soft, nontender, non-distended  MS: No edema; No deformity. Neuro:  Nonfocal  Psych: Normal affect   Labs    High Sensitivity Troponin:   Recent Labs  Lab  01/31/21 0758 01/31/21 0950 01/31/21 1400 01/31/21 1759  TROPONINIHS 17 20* 20* 20*      Chemistry Recent Labs  Lab 01/30/21 1037 01/31/21 0758 01/31/21 1225 02/01/21 0105 02/02/21 0059  NA 141  --  138 140 136  K 4.6  --  4.3 4.1 4.2  CL 104  --   --  97* 95*  CO2 29  --   --  33* 32  GLUCOSE 165*  --   --  149* 138*  BUN 19  --   --  15 19  CREATININE 1.01*  --   --  0.91 0.97  CALCIUM 9.8  --   --  9.0 8.9  PROT  --  7.9  --  6.4* 6.0*  ALBUMIN  --  3.7  --  3.0* 2.8*  AST  --  33  --  23 23  ALT  --  28  --  19 15  ALKPHOS  --  101  --  76 72  BILITOT  --  1.0  --  1.1 1.3*  GFRNONAA 54*  --   --  >60 57*  ANIONGAP 8  --   --  10 9  Hematology Recent Labs  Lab 01/30/21 1037 01/31/21 1225 02/01/21 0105 02/02/21 0059  WBC 4.9  --  5.3 5.2  RBC 5.25*  --  4.96 4.70  HGB 15.7* 18.4* 15.1* 14.1  HCT 51.5* 54.0* 48.7* 46.1*  MCV 98.1  --  98.2 98.1  MCH 29.9  --  30.4 30.0  MCHC 30.5  --  31.0 30.6  RDW 14.6  --  14.4 13.7  PLT 174  --  165 141*    BNP Recent Labs  Lab 01/30/21 1037  BNP 171.8*     DDimer  Recent Labs  Lab 01/31/21 0950  DDIMER 0.74*     Radiology    CT Angio Chest PE W/Cm &/Or Wo Cm  Result Date: 01/31/2021 CLINICAL DATA:  Positive D-dimer. EXAM: CT ANGIOGRAPHY CHEST WITH CONTRAST TECHNIQUE: Multidetector CT imaging of the chest was performed using the standard protocol during bolus administration of intravenous contrast. Multiplanar CT image reconstructions and MIPs were obtained to evaluate the vascular anatomy. CONTRAST:  43mL OMNIPAQUE IOHEXOL 350 MG/ML SOLN COMPARISON:  May 25, 2017. FINDINGS: Cardiovascular: Satisfactory opacification of the pulmonary arteries to the segmental level. No evidence of pulmonary embolism. Mild cardiomegaly is noted. No pericardial effusion. Atherosclerosis of thoracic aorta is noted without aneurysm formation. Coronary artery calcifications are noted with probable stent seen in the left  anterior descending artery. Mediastinum/Nodes: Small sliding-type hiatal hernia is noted. Thyroid gland is unremarkable. 1.4 cm subcarinal adenopathy is noted. 13 mm right hilar adenopathy is noted. 10 mm aortopulmonary window adenopathy is noted. This may be inflammatory in etiology, but neoplasm cannot be excluded. Lungs/Pleura: No pneumothorax or pleural effusion is noted. Emphysematous disease is noted in the upper lobes bilaterally. Mild bilateral posterior basilar subsegmental atelectasis is noted. Upper Abdomen: No acute abnormality. Musculoskeletal: No chest wall abnormality. No acute or significant osseous findings. Review of the MIP images confirms the above findings. IMPRESSION: 1. No definite evidence of pulmonary embolus. 2. Coronary artery calcifications are noted with probable stent seen in the left anterior descending artery. 3. Small sliding-type hiatal hernia. 4. Mediastinal adenopathy is noted which may be inflammatory in etiology, but neoplasm cannot be excluded. Follow-up CT scan in 3 months is recommended to ensure stability or resolution. 5. Emphysema and aortic atherosclerosis. Aortic Atherosclerosis (ICD10-I70.0) and Emphysema (ICD10-J43.9). Electronically Signed   By: Marijo Conception M.D.   On: 01/31/2021 11:49   ECHOCARDIOGRAM COMPLETE  Result Date: 01/31/2021    ECHOCARDIOGRAM REPORT   Patient Name:   [name] Date of Exam: 01/31/2021 Medical Rec #:  [id number]         Height:       63.0 in Accession #:    [id number]        Weight:       163.4 lb Date of Birth:  1931-08-24        BSA:          1.774 m Patient Age:    87 years          BP:           169/49 mmHg Patient Gender: F                 HR:           53 bpm. Exam Location:  Inpatient Procedure: 2D Echo, Cardiac Doppler and Color Doppler Indications:    I50.23 Acute on chronic systolic (congestive) heart failure  History:        Patient has  prior history of Echocardiogram examinations, most                 recent  05/16/2020. Previous Myocardial Infarction, CAD and                 Angina, COPD, Arrythmias:Atrial Fibrillation,                 Signs/Symptoms:Murmur; Risk Factors:Hypertension, Diabetes,                 Dyslipidemia and Sleep Apnea.  Sonographer:    Tiffany Dance Referring Phys: [id number] NA LI IMPRESSIONS  1. Left ventricular ejection fraction, by estimation, is 40 to 45%. The left ventricle has mildly decreased function. The left ventricle demonstrates global hypokinesis. There is moderate left ventricular hypertrophy. Left ventricular diastolic function  could not be evaluated.  2. Right ventricular systolic function is mildly reduced. The right ventricular size is normal.  3. Left atrial size was moderately dilated.  4. The mitral valve is abnormal. Mild mitral valve regurgitation. Moderate mitral annular calcification.  5. The aortic valve is tricuspid. Aortic valve regurgitation is not visualized. Comparison(s): Changes from prior study are noted. 05/16/20: LVEF 50-55%, grade 3 DD. FINDINGS  Left Ventricle: Left ventricular ejection fraction, by estimation, is 40 to 45%. The left ventricle has mildly decreased function. The left ventricle demonstrates global hypokinesis. The left ventricular internal cavity size was normal in size. There is  moderate left ventricular hypertrophy. Left ventricular diastolic function could not be evaluated due to atrial fibrillation. Left ventricular diastolic function could not be evaluated. Right Ventricle: The right ventricular size is normal. No increase in right ventricular wall thickness. Right ventricular systolic function is mildly reduced. Left Atrium: Left atrial size was moderately dilated. Right Atrium: Right atrial size was normal in size. Pericardium: There is no evidence of pericardial effusion. Mitral Valve: The mitral valve is abnormal. Moderate mitral annular calcification. Mild mitral valve regurgitation, with posteriorly-directed jet. Tricuspid Valve: The  tricuspid valve is grossly normal. Tricuspid valve regurgitation is trivial. Aortic Valve: The aortic valve is tricuspid. Aortic valve regurgitation is not visualized. Pulmonic Valve: The pulmonic valve was grossly normal. Pulmonic valve regurgitation is not visualized. Aorta: The aortic root and ascending aorta are structurally normal, with no evidence of dilitation. IAS/Shunts: No atrial level shunt detected by color flow Doppler.  LEFT VENTRICLE PLAX 2D LVIDd:         3.70 cm LVIDs:         2.90 cm LV PW:         1.20 cm LV IVS:        1.30 cm LVOT diam:     1.90 cm LV SV:         44 LV SV Index:   25 LVOT Area:     2.84 cm  RIGHT VENTRICLE          IVC RV Basal diam:  2.90 cm  IVC diam: 1.80 cm TAPSE (M-mode): 1.6 cm LEFT ATRIUM             Index       RIGHT ATRIUM           Index LA diam:        4.70 cm 2.65 cm/m  RA Area:     18.30 cm LA Vol (A2C):   77.9 ml 43.90 ml/m RA Volume:   51.90 ml  29.25 ml/m LA Vol (A4C):   78.5 ml 44.24 ml/m LA Biplane Vol: 80.4  ml 45.31 ml/m  AORTIC VALVE LVOT Vmax:   85.80 cm/s LVOT Vmean:  60.750 cm/s LVOT VTI:    0.154 m  AORTA Ao Root diam: 3.20 cm MITRAL VALVE MV Area (PHT): 3.71 cm    SHUNTS MV Decel Time: 205 msec    Systemic VTI:  0.15 m MV E velocity: 93.00 cm/s  Systemic Diam: 1.90 cm Lyman Bishop MD Electronically signed by Lyman Bishop MD Signature Date/Time: 01/31/2021/4:42:34 PM    Final    VAS [place] LOWER EXTREMITY VENOUS (DVT)  Result Date: 02/01/2021  Lower Venous DVT Study Indications: Edema, and SOB.  Risk Factors: Extensive history of peripheral arterial disease. Anticoagulation: Eliquis. Comparison Study: Performing Technologist: Darlin Coco RDMS  Examination Guidelines: A complete evaluation includes B-mode imaging, spectral Doppler, color Doppler, and power Doppler as needed of all accessible portions of each vessel. Bilateral testing is considered an integral part of a complete examination. Limited examinations for reoccurring indications may be  performed as noted. The reflux portion of the exam is performed with the patient in reverse Trendelenburg.  +---------+---------------+---------+-----------+----------+--------------+ RIGHT    CompressibilityPhasicitySpontaneityPropertiesThrombus Aging +---------+---------------+---------+-----------+----------+--------------+ CFV      Full           Yes      Yes                                 +---------+---------------+---------+-----------+----------+--------------+ SFJ      Full                                                        +---------+---------------+---------+-----------+----------+--------------+ FV Prox  Full                                                        +---------+---------------+---------+-----------+----------+--------------+ FV Mid   Full                                                        +---------+---------------+---------+-----------+----------+--------------+ FV DistalFull                                                        +---------+---------------+---------+-----------+----------+--------------+ PFV      Full                                                        +---------+---------------+---------+-----------+----------+--------------+ POP      Full           Yes      Yes                                 +---------+---------------+---------+-----------+----------+--------------+  PTV      Full                                                        +---------+---------------+---------+-----------+----------+--------------+ PERO     Full                                                        +---------+---------------+---------+-----------+----------+--------------+   +---------+---------------+---------+-----------+----------+--------------+ LEFT     CompressibilityPhasicitySpontaneityPropertiesThrombus Aging +---------+---------------+---------+-----------+----------+--------------+ CFV      Full            Yes      Yes                                 +---------+---------------+---------+-----------+----------+--------------+ SFJ      Full                                                        +---------+---------------+---------+-----------+----------+--------------+ FV Prox  Full                                                        +---------+---------------+---------+-----------+----------+--------------+ FV Mid   Full                                                        +---------+---------------+---------+-----------+----------+--------------+ FV DistalFull                                                        +---------+---------------+---------+-----------+----------+--------------+ PFV      Full                                                        +---------+---------------+---------+-----------+----------+--------------+ POP      Full           Yes      Yes                                 +---------+---------------+---------+-----------+----------+--------------+ PTV      Full                                                        +---------+---------------+---------+-----------+----------+--------------+  PERO     Full                                                        +---------+---------------+---------+-----------+----------+--------------+     Summary: RIGHT: - There is no evidence of deep vein thrombosis in the lower extremity.  - No cystic structure found in the popliteal fossa.  LEFT: - There is no evidence of deep vein thrombosis in the lower extremity.  - No cystic structure found in the popliteal fossa.  *See table(s) above for measurements and observations. Electronically signed by Monica Martinez MD on 02/01/2021 at 7:54:58 PM.    Final     Cardiac Studies   Echocardiogram this admission EF 50% challenging views.  RV function mildly reduced.  Patient Profile     87 y.o. female here with COPD, chronic  diastolic heart failure, CAD  Assessment & Plan    Acute on chronic diastolic heart failure Hypercarbic respiratory failure COPD-Dr. Halford Chessman. Treating as well. -Previous systolic heart failure with recovery of EF felt to be perhaps tachycardia mediated. -Seen in the advanced heart failure clinic by Dr. Haroldine Laws last December 18, 2020.  Has been on high-dose Lasix. -Agree with continuation of IV furosemide but will increase from 60 to 80 mg IV twice daily.  BNP slightly elevated at 171.  She is out approximately 1.6 L. -She has not been on Entresto in the past secondary to angioedema with angiotensin receptor blocker. -He is on lower dose of Jardiance 5 mg a day unable to tolerate 10 mg per Dr. Clayborne Dana prior visit. -Continuing with carvedilol 6.25 mg twice a day. -Not on spironolactone but could consider if blood pressure elevated.  Coronary artery disease -Prior PCI.  She has been on both Plavix and Eliquis with no bleeding events.  Hyperlipidemia -She is on PCSK9 inhibitor Repatha and Zetia as well.  Secondary pulmonary hypertension -Previous right heart catheterization reviewed: 05/16/20  Ao = 172/68 (108) LV = 166/10 RA = 6 RV = 75/6 PA = 77/26 (45) PCW = 10 Fick cardiac output/index = 4.0/2.2 PVR = 8.8 WU FA sat = 99% PA sat = 67%, 73%  Assessment: 1. Severe 3v CAD-PCI to left circumflex and LAD staged approach 2. LVEF 65% 3. Severe PAH   Atrial flutter -On chronic anticoagulation with Eliquis.  Elevated troponin -Flat, 20, demand ischemia in the setting of respiratory/heart failure       For questions or updates, please contact [facility] Please consult [email or web address] for contact info under        Signed, Candee Furbish, MD  02/02/2021, 9:22 AM

## 2021-02-02 NOTE — Discharge Instructions (Addendum)

## 2021-02-03 DIAGNOSIS — J449 Chronic obstructive pulmonary disease, unspecified: Secondary | ICD-10-CM

## 2021-02-03 DIAGNOSIS — I2721 Secondary pulmonary arterial hypertension: Secondary | ICD-10-CM

## 2021-02-03 DIAGNOSIS — I5043 Acute on chronic combined systolic (congestive) and diastolic (congestive) heart failure: Secondary | ICD-10-CM

## 2021-02-03 DIAGNOSIS — N1831 Chronic kidney disease, stage 3a: Secondary | ICD-10-CM

## 2021-02-03 DIAGNOSIS — I509 Heart failure, unspecified: Secondary | ICD-10-CM | POA: Diagnosis not present

## 2021-02-03 DIAGNOSIS — I5033 Acute on chronic diastolic (congestive) heart failure: Secondary | ICD-10-CM | POA: Diagnosis not present

## 2021-02-03 LAB — CBC
HCT: 51.9 % — ABNORMAL HIGH (ref 36.0–46.0)
Hemoglobin: 15.7 g/dL — ABNORMAL HIGH (ref 12.0–15.0)
MCH: 29.2 pg (ref 26.0–34.0)
MCHC: 30.3 g/dL (ref 30.0–36.0)
MCV: 96.6 fL (ref 80.0–100.0)
Platelets: 145 10*3/uL — ABNORMAL LOW (ref 150–400)
RBC: 5.37 MIL/uL — ABNORMAL HIGH (ref 3.87–5.11)
RDW: 13.4 % (ref 11.5–15.5)
WBC: 4.2 10*3/uL (ref 4.0–10.5)
nRBC: 0 % (ref 0.0–0.2)

## 2021-02-03 LAB — BASIC METABOLIC PANEL
Anion gap: 12 (ref 5–15)
BUN: 20 mg/dL (ref 8–23)
CO2: 32 mmol/L (ref 22–32)
Calcium: 9.2 mg/dL (ref 8.9–10.3)
Chloride: 94 mmol/L — ABNORMAL LOW (ref 98–111)
Creatinine, Ser: 0.84 mg/dL (ref 0.44–1.00)
GFR, Estimated: >60 mL/min (ref >60)
Glucose, Bld: 167 mg/dL — ABNORMAL HIGH (ref 70–99)
Potassium: 3.9 mmol/L (ref 3.5–5.1)
Sodium: 138 mmol/L (ref 135–145)

## 2021-02-03 LAB — GLUCOSE, CAPILLARY
Glucose-Capillary: 161 mg/dL — ABNORMAL HIGH (ref 70–99)
Glucose-Capillary: 275 mg/dL — ABNORMAL HIGH (ref 70–99)
Glucose-Capillary: 287 mg/dL — ABNORMAL HIGH (ref 70–99)
Glucose-Capillary: 245 mg/dL — ABNORMAL HIGH (ref 70–99)

## 2021-02-03 MED ORDER — ASPIRIN 81 MG PO CHEW: 81.0000 mg | CHEWABLE_TABLET | ORAL | Status: DC

## 2021-02-03 MED ORDER — SODIUM CHLORIDE 0.9% FLUSH
3.0000 mL | Freq: Two times a day (BID) | INTRAVENOUS | Status: DC
  Administered 2021-02-03 – 2021-02-05 (×3): 3 mL via INTRAVENOUS

## 2021-02-03 MED ORDER — SODIUM CHLORIDE 0.9 % IV SOLN: INTRAVENOUS | Status: DC

## 2021-02-03 MED ORDER — SODIUM CHLORIDE 0.9% FLUSH: 3.0000 mL | INTRAVENOUS | Status: DC | PRN

## 2021-02-03 MED ORDER — SODIUM CHLORIDE 0.9 % IV SOLN: 250.0000 mL | INTRAVENOUS | Status: DC | PRN

## 2021-02-03 NOTE — H&P (View-Only) (Signed)
Advanced Heart Failure Team Consult Note   Primary Physician: [name], MD PCP-Cardiologist:  Pixie Casino, MD  [facility]: Dr. Haroldine Laws   Reason for Consultation: acute on chronic systolic and diastolic heart failure   HPI:    [name] is seen today for evaluation of acute on chronic systolic and diastolic heart failure at the request of Dr. Marlou Porch, Cardiology.  [name] Salim Griffinis a 87 y.o.femalewith CAD, HTN, atrial fibrillation/A flutter and chronic prior systolic heart failure, (which was likely rate related) and diastolic heart failure. She was referred by Dr. Lovena Le for further evaluation of Pulmonary HTN.   Underwent 2D echo in 3/15 which showed EF 60-65% mild LVH. RV was normal. No significant valvular abnormalities.   Underwent R/L cath 05/07/14 which showed stable CAD and significant PH with normal PVR.   In 4/21 saw Dr. Scot Dock and had LE angio with severe PAD underwent stenting of 70% R external iliac  Admitted 05/21/17-05/27/17 with COPD exacerbation, acute on chronic diastolic CHF. Diuresed 5L with IV Lasix. Discharge weight 173 pounds.  Myoview 9/20   Nuclear stress EF: 56%.  There was no ST segment deviation noted during stress.  Defect 1: There is a medium defect of moderate severity present in the mid inferolateral, apical inferior and apical lateral location.  Findings consistent with possible mild ischemia in the apical inferior and inferolateral regions.  The left ventricular ejection fraction is normal (55-65%).   Admitted in 5/21 with NSTEMI hstrop peak 1,455. Echo 5/21: EF 50-55%  Cath 05/16/20 with  1. Severe 3 vessel obstructive CAD 2. Severe pulmonary HTN 3. Successful PCI of the proximal LCx/OM1 with DES x 1 with IVUS guidance 4. Successful PCI of the proximal LAD with orbital atherectomy and DES x 1  Ao = 172/68 (108) LV = 166/10 RA = 6 RV = 75/6 PA = 77/26 (45) PCW = 10 Fick cardiac output/index = 4.0/2.2 PVR =  8.8 WU FA sat = 99% PA sat = 67%, 73%  Presented [facility] on 2/4 w/ CC of increased dyspnea and LEE, in the setting of dietary indiscretion w/ sodium. COVID negative. CXR w/ mild interstitial edema and small bilateral pleural effusions. D-dimer elevated 0.74. BNP 171. Hs Troponin 20>>20>>20. Chest CT negative for PE but did show mediastinal adenopathy which may be inflammatory in etiology, but neoplasm cannot be excluded. Follow-up CT scan in 3 months is recommended to ensure stability or resolution. LE venous dopplers also negative for DVT.   She was admitted by IM and started on IV Lasix. Repeat echo was done and showed reduced LVEF ~40-45%. Cardiology consulted. Echo was personally reviewed by Dr. Margaretann Loveless who felt EF appears grossly preserved, and likely no significant change from the prior echocardiogram EF, may be slightly reduced from prior. RV mildly reduced.  She is net negative 2.4L since admit. SCr stable, 0.84. K 3.9   LEE has resolved but she remains fluid overloaded on exam. Still on 6L [place]. In Afib HR 70s on tele. Denies CP.    Echo 01/30/21 Left ventricular ejection fraction, by estimation, is 40 to 45%. The left ventricle has mildly decreased function. The left ventricle demonstrates global hypokinesis. There is moderate left ventricular hypertrophy. Left ventricular diastolic function could not be evaluated. 2. Right ventricular systolic function is mildly reduced. The right ventricular size is normal. 3. Left atrial size was moderately dilated. 4. The mitral valve is abnormal. Mild mitral valve regurgitation. Moderate mitral annular calcification. 5. The aortic  valve is tricuspid. Aortic valve regurgitation is not visualized.  Review of Systems: [y] = yes, [ ]  = no   . General: Weight gain [ ] ; Weight loss [ ] ; Anorexia [ ] ; Fatigue [ ] ; Fever [ ] ; Chills [ ] ; Weakness [ ]   . Cardiac: Chest pain/pressure [ ] ; Resting SOB [ ] ; Exertional SOB [Y ]; Orthopnea [ Y]; Pedal Edema  [Y ]; Palpitations [ ] ; Syncope [ ] ; Presyncope [ ] ; Paroxysmal nocturnal dyspnea[ ]   . Pulmonary: Cough [ ] ; Wheezing[ ] ; Hemoptysis[ ] ; Sputum [ ] ; Snoring [ ]   . GI: Vomiting[ ] ; Dysphagia[ ] ; Melena[ ] ; Hematochezia [ ] ; Heartburn[ ] ; Abdominal pain [ ] ; Constipation [ ] ; Diarrhea [ ] ; BRBPR [ ]   . GU: Hematuria[ ] ; Dysuria [ ] ; Nocturia[ ]   . Vascular: Pain in legs with walking [ ] ; Pain in feet with lying flat [ ] ; Non-healing sores [ ] ; Stroke [ ] ; TIA [ ] ; Slurred speech [ ] ;  . Neuro: Headaches[ ] ; Vertigo[ ] ; Seizures[ ] ; Paresthesias[ ] ;Blurred vision [ ] ; Diplopia [ ] ; Vision changes [ ]   . Ortho/Skin: Arthritis [ ] ; Joint pain [ ] ; Muscle pain [ ] ; Joint swelling [ ] ; Back Pain [ ] ; Rash [ ]   . Psych: Depression[ ] ; Anxiety[ ]   . Heme: Bleeding problems [ ] ; Clotting disorders [ ] ; Anemia [ ]   . Endocrine: Diabetes [ ] ; Thyroid dysfunction[ ]   Home Medications Prior to Admission medications   Medication Sig Start Date End Date Taking? Authorizing Provider  acetaminophen (TYLENOL) 500 MG tablet Take 1,000 mg by mouth every 6 (six) hours as needed (pain).   Yes [provider]  amLODipine (NORVASC) 10 MG tablet Take 1 tablet (10 mg total) by mouth daily. 12/18/20  Yes Sian Rockers, Shaune Pascal, MD  CALCIUM-MAG-VIT C-VIT D PO Take 1 tablet by mouth daily.    Yes [provider]  carvedilol (COREG) 6.25 MG tablet TAKE 1 TABLET BY MOUTH  TWICE DAILY WITH MEALS Patient taking differently: Take 6.25 mg by mouth 2 (two) times daily with a meal. 12/22/20  Yes Kelley Knoth, Shaune Pascal, MD  cholecalciferol (VITAMIN D) 1000 units tablet Take 1,000 Units by mouth daily after supper.   Yes [provider]  clopidogrel (PLAVIX) 75 MG tablet TAKE 1 TABLET BY MOUTH ONCE DAILY WITH BREAKFAST Patient taking differently: Take 75 mg by mouth daily. 12/17/20  Yes Hilty, Nadean Corwin, MD  ELIQUIS 5 MG TABS tablet TAKE 1 TABLET BY MOUTH  TWICE DAILY Patient taking differently: Take 5 mg by  mouth 2 (two) times daily. 12/03/20  Yes Alisah Grandberry, Shaune Pascal, MD  ezetimibe (ZETIA) 10 MG tablet Take 1 tablet (10 mg total) by mouth daily. 08/12/20  Yes Hilty, Nadean Corwin, MD  furosemide (LASIX) 80 MG tablet TAKE 1 TABLET BY MOUTH  TWICE DAILY Patient taking differently: Take 80 mg by mouth 2 (two) times daily. 12/22/20  Yes Bintou Lafata, Shaune Pascal, MD  Insulin Lispro Prot & Lispro (HUMALOG MIX 75/25 KWIKPEN) (75-25) 100 UNIT/ML Kwikpen Inject 36 units into the skin daily with breakfast Patient taking differently: 36 Units in the morning and at bedtime. Inject 36 units into the skin daily with breakfast 12/09/20  Yes Renato Shin, MD  Multiple Vitamin (MULTIVITAMIN WITH MINERALS) TABS tablet Take 1 tablet by mouth daily. Centrum Silver   Yes [provider]  nitroGLYCERIN (NITROSTAT) 0.4 MG SL tablet Place 1 tablet (0.4 mg total) under the tongue every 5 (five) minutes as needed for chest  pain. Reported on 01/28/2016 01/03/20  Yes Mialee Weyman, Shaune Pascal, MD  potassium chloride SA (KLOR-CON) 20 MEQ tablet Take 20 mEq by mouth 2 (two) times daily.   Yes [provider]  empagliflozin (JARDIANCE) 10 MG TABS tablet Take 10 mg by mouth daily before breakfast. Patient not taking: No sig reported 10/26/19   Amada Hallisey, Shaune Pascal, MD  Evolocumab (REPATHA SURECLICK) 629 MG/ML SOAJ Inject 1 Dose into the skin every 14 (fourteen) days. Patient not taking: No sig reported 12/25/20   Pixie Casino, MD  glucose blood [facility] VERIO) test strip USE TO MONITOR GLUCOSE  LEVELS TWICE DAILY 10/03/20   Renato Shin, MD  Lancets (ONETOUCH DELICA PLUS [id number]) [place] USE TO MONITOR GLUCOSE  LEVELS TWICE DAILY 05/03/20   Renato Shin, MD    Past Medical History: Past Medical History:  Diagnosis Date  . Anemia    iron defficiency  . Anginal pain ([place])   . Arthritis    "knees, feet, hands; joints" (05/27/2015)  . Asthma   . Atrial fibrillation or flutter    s/p RFCA 7/08;   s/p DCCV in past;   previously on  amiodarone;  amio stopped due to lung toxicity  . CAD (coronary artery disease)    s/p NSTEMI tx with BMS to OM1 3/08;  cath 3/08: pOM 99% tx with PCI, pLAD 20%, ? mod stenosis at the AM  . CAP (community acquired pneumonia) 05/24/2015  . CHF (congestive heart failure) ([place])   . Chronic diastolic heart failure (HCC)    echo 11/11:  EF 55-60%, severe LVH, mod LAE, mild MR, mildly increased PASP  . COPD (chronic obstructive pulmonary disease) ([place])   . Degenerative joint disease   . Dystrophy, corneal stromal   . Heart murmur   . HLD (hyperlipidemia)   . HTN (hypertension)    essential nos  . Hypopotassemia    PMH of  . Muscle pain   . Myocardial infarction ([place]) 2008  . OSA on CPAP   . Osteoporosis   . Pneumonia 05/27/2015  . Protein calorie malnutrition ([place])   . Rash and nonspecific skin eruption    both arms,awaiting bio   dr Delman Cheadle  . Seasonal allergies   . Shortness of breath dyspnea   . Type II diabetes mellitus (HCC)    Dr Loanne Drilling    Past Surgical History: Past Surgical History:  Procedure Laterality Date  . A FLUTTER ABLATION     Dr Lovena Le  . ABDOMINAL AORTOGRAM W/LOWER EXTREMITY Bilateral 04/11/2020   Procedure: ABDOMINAL AORTOGRAM W/LOWER EXTREMITY;  Surgeon: Angelia Mould, MD;  Location: [place] [facility];  Service: Cardiovascular;  Laterality: Bilateral;  . CATARACT EXTRACTION W/ INTRAOCULAR LENS  IMPLANT, BILATERAL Bilateral   . CHOLECYSTECTOMY    . COLONOSCOPY  6/07   2 polyps Dr. Kinnie Feil in [place]  . colonoscopy with polypectomy      X 2; [facility]  . CORNEAL TRANSPLANT Bilateral   . CORONARY ANGIOPLASTY WITH STENT PLACEMENT  02/2007   BMS w/Dr Lovena Le  . CORONARY ATHERECTOMY N/A 05/16/2020   Procedure: CORONARY ATHERECTOMY;  Surgeon: Martinique, Peter M, MD;  Location: [place] [facility];  Service: Cardiovascular;  Laterality: N/A;  . CORONARY BALLOON ANGIOPLASTY N/A 05/16/2020   Procedure: CORONARY BALLOON ANGIOPLASTY;  Surgeon: Martinique, Peter M, MD;   Location: [place] [facility];  Service: Cardiovascular;  Laterality: N/A;  . CORONARY STENT INTERVENTION N/A 05/16/2020   Procedure: CORONARY STENT INTERVENTION;  Surgeon: Martinique, Peter M, MD;  Location: [place] [facility];  Service: Cardiovascular;  Laterality: N/A;  . EYE SURGERY    . gravid 2 para 2    . INTRAVASCULAR ULTRASOUND/IVUS N/A 05/16/2020   Procedure: Intravascular Ultrasound/IVUS;  Surgeon: Martinique, Peter M, MD;  Location: [place] [facility];  Service: Cardiovascular;  Laterality: N/A;  . KNEE CARTILAGE SURGERY Right   . LEFT AND RIGHT HEART CATHETERIZATION WITH CORONARY ANGIOGRAM N/A 05/07/2014   Procedure: LEFT AND RIGHT HEART CATHETERIZATION WITH CORONARY ANGIOGRAM;  Surgeon: Peter M Martinique, MD;  Location: [facility] C[facility] LAB;  Service: Cardiovascular;  Laterality: N/A;  . PERIPHERAL VASCULAR INTERVENTION  04/11/2020   Procedure: PERIPHERAL VASCULAR INTERVENTION;  Surgeon: Angelia Mould, MD;  Location: [place] [facility];  Service: Cardiovascular;;  . RIGHT/LEFT HEART CATH AND CORONARY ANGIOGRAPHY N/A 05/16/2020   Procedure: RIGHT/LEFT HEART CATH AND CORONARY ANGIOGRAPHY;  Surgeon: Jolaine Artist, MD;  Location: [place] [facility];  Service: Cardiovascular;  Laterality: N/A;  . TOTAL KNEE ARTHROPLASTY Right 06/28/2016   Procedure: TOTAL KNEE ARTHROPLASTY;  Surgeon: Frederik Pear, MD;  Location: [place];  Service: Orthopedics;  Laterality: Right;    Family History: Family History  Problem Relation Age of Onset  . Diabetes Mother   . Hypertension Mother   . Transient ischemic attack Mother   . Arthritis Mother   . Heart attack Father 71  . Arthritis Father   . Hypertension Father   . Breast cancer Maternal Aunt   . Arthritis Maternal Aunt     Social History: Social History   Socioeconomic History  . Marital status: Widowed    Spouse name: Not on file  . Number of children: 2  . Years of education: 16  . Highest education level: Bachelor's degree (e.g., BA, AB, BS)   Occupational History  . Occupation: Recruitment consultant Retired  Tobacco Use  . Smoking status: Former Smoker    Packs/day: 0.25    Years: 18.00    Pack years: 4.50    Types: Cigarettes    Quit date: 12/27/1968    Years since quitting: 52.1  . Smokeless tobacco: Never Used  . Tobacco comment: smoked [place], up to 1 pp week  Vaping Use  . Vaping Use: Never used  Substance and Sexual Activity  . Alcohol use: Yes    Comment: occ  . Drug use: No  . Sexual activity: Never  Other Topics Concern  . Not on file  Social History Narrative   Teacher. Widowed. Rarely drinks cafeine.    Social Determinants of Health   Financial Resource Strain: Low Risk   . Difficulty of Paying Living Expenses: Not very hard  Food Insecurity: Not on file  Transportation Needs: Not on file  Physical Activity: Not on file  Stress: Not on file  Social Connections: Not on file    Allergies:  Allergies  Allergen Reactions  . Levaquin [Levofloxacin] Shortness Of Breath and Swelling    angioedema  . Lobster [Shellfish Allergy] Other (See Comments)    angioedema  . Penicillins Rash    Has patient had a PCN reaction causing immediate rash, facial/tongue/throat swelling, SOB or lightheadedness with hypotension: Yes Has patient had a PCN reaction causing severe rash involving mucus membranes or skin necrosis: No Has patient had a PCN reaction that required hospitalization: No Has patient had a PCN reaction occurring within the last 10 years: No If all of the above answers are "NO", then may proceed with Cephalosporin use.  . Valsartan Other (See Comments)  REACTION: angioedema  . Codeine Other (See Comments)    Mental status changes  . Lipitor [Atorvastatin] Other (See Comments)    weakness  . Oxycodone Other (See Comments)    hallucinations  . Statins     Made her too weak  . Tramadol   . Amlodipine Besylate Other (See Comments)    REACTION: tingling in lips & gum edema 7/12: talked to patient,  states she is tolerating well  . Crestor [Rosuvastatin Calcium] Rash  . Tramadol Hcl Nausea And Vomiting    Objective:    Vital Signs:   Temp:  [98.2 F (36.8 C)-98.6 F (37 C)] 98.6 F (37 C) (02/08 0737) Pulse Rate:  [68-80] 73 (02/08 0737) Resp:  [18-23] 20 (02/08 0737) BP: (150-175)/(61-72) 155/67 (02/08 0737) SpO2:  [96 %-98 %] 96 % (02/08 0737)    Weight change: Filed Weights   01/31/21 2044 02/02/21 0802  Weight: 75.9 kg 75.9 kg    Intake/Output:   Intake/Output Summary (Last 24 hours) at 02/03/2021 1225 Last data filed at 02/03/2021 7322 Gross per 24 hour  Intake --  Output 800 ml  Net -800 ml      Physical Exam    General:  Well appearing elderly F, looks younger than actual age. No resp difficulty HEENT: normal Neck: supple. JVP elevated to jaw . Carotids 2+ bilat; no bruits. No lymphadenopathy or thyromegaly appreciated. Cor: PMI nondisplaced. Irregularly irregular rhythm. No rubs, gallops or murmurs. Lungs: faint bibasilar crackles, L>R Abdomen: soft, nontender, nondistended. No hepatosplenomegaly. No bruits or masses. Good bowel sounds. Extremities: no cyanosis, clubbing, rash, edema Neuro: alert & orientedx3, cranial nerves grossly intact. moves all 4 extremities w/o difficulty. Affect pleasant   Telemetry   Atrial fibrillation 70s   EKG    No new EKG to review   Labs   Basic Metabolic Panel: Recent Labs  Lab 01/30/21 1037 01/31/21 1225 02/01/21 0105 02/02/21 0059 02/03/21 0157  NA 141 138 140 136 138  K 4.6 4.3 4.1 4.2 3.9  CL 104  --  97* 95* 94*  CO2 29  --  33* 32 32  GLUCOSE 165*  --  149* 138* 167*  BUN 19  --  15 19 20   CREATININE 1.01*  --  0.91 0.97 0.84  CALCIUM 9.8  --  9.0 8.9 9.2    Liver Function Tests: Recent Labs  Lab 01/31/21 0758 02/01/21 0105 02/02/21 0059  AST 33 23 23  ALT 28 19 15   ALKPHOS 101 76 72  BILITOT 1.0 1.1 1.3*  PROT 7.9 6.4* 6.0*  ALBUMIN 3.7 3.0* 2.8*   No results for input(s): LIPASE,  AMYLASE in the last 168 hours. No results for input(s): AMMONIA in the last 168 hours.  CBC: Recent Labs  Lab 01/30/21 1037 01/31/21 1225 02/01/21 0105 02/02/21 0059 02/03/21 0157  WBC 4.9  --  5.3 5.2 4.2  HGB 15.7* 18.4* 15.1* 14.1 15.7*  HCT 51.5* 54.0* 48.7* 46.1* 51.9*  MCV 98.1  --  98.2 98.1 96.6  PLT 174  --  165 141* 145*    Cardiac Enzymes: No results for input(s): CKTOTAL, CKMB, CKMBINDEX, TROPONINI in the last 168 hours.  BNP: BNP (last 3 results) Recent Labs    06/09/20 1130 12/18/20 1208 01/30/21 1037  BNP 225.6* 134.0* 171.8*    ProBNP (last 3 results) No results for input(s): PROBNP in the last 8760 hours.   CBG: Recent Labs  Lab 02/02/21 1222 02/02/21 1621 02/02/21 2137 02/03/21 0735 02/03/21  1218  GLUCAP 227* 196* 259* 161* 275*    Coagulation Studies: No results for input(s): LABPROT, INR in the last 72 hours.   Imaging    No results found.   Medications:     Current Medications: . apixaban  5 mg Oral BID  . carvedilol  6.25 mg Oral BID WC  . cholecalciferol  1,000 Units Oral QPC supper  . clopidogrel  75 mg Oral Daily  . ezetimibe  10 mg Oral Daily  . furosemide  80 mg Intravenous Q12H  . insulin aspart  0-5 Units Subcutaneous QHS  . insulin aspart  0-9 Units Subcutaneous TID WC  . insulin glargine  15 Units Subcutaneous QHS  . sodium chloride flush  3 mL Intravenous Q12H     Infusions: . sodium chloride       Assessment/Plan   1) Acute on Chronic Combined Systolic and Diastolic Heart Failure -Echo 02/2015 EF 60-65%, LA severely dilated, Echo 10/2017: EF 65-70%, grade 3 DD - Echo 5/21 EF 50-55% - Admitted w/ dyspnea and fluid overload, not improved w/ titration of home regimen - Echo this admit EF 40-45%, RV mildly reduced  - Volume improved w/ IV Lasix but remains mildly SOB - Continue IV Lasix 80 mg bid - Will need repeat RHC prior tomorrow - Not on Entresto due to h/o angioedema with ARB - resume home  Jardiance   2) PAH - Moderate to severe by cath 5/21. PVR 8.8 - suspect multifactorial  - wearing CPAP qhs  - Plan was to repeat RHC after ~2 months of compliance w/ CPAP thearpy  - Will set up [place] prior tomorrow  3 CAD - H/o of stent in 2008 - NSTEMI 5/21. Cath as above. S/p PCI to LAD & OM - No s/s angina - Hs trop 20>>20>>20, not c/w ACS  - On Plavix, apixaban - Unable to tolerate statins. On Repatha/zetia per [facility]   4) PAD - severe  - s/p stenting to R external iliac with Dr. Scot Dock - Continues with mild claudication    5) Paroxysmal atrial fib/flutter -remains in AFL today. Rate controlled. Asx This patients CHA2DS2-VASc Score and unadjusted Ischemic Stroke Rate (% per year) is equal to 7.2 % stroke rate/year from a score of 5 Above score calculated as 2 points each if present [Age >75, or Stroke/TIA/TE] - Continue Eliquis. No bleeding  6) OSA/COPD - Follows with Dr. Halford Chessman.  - Compliant with CPAP  7) HTN - SBPs elevated ~150s - restart home amlodipine   8) DM2 - SSI  - On Jardiance 5mg  daily at home (unable to tolerate 10mg  daily)  9) HL  - Unable to tolerate statins. On Repatha/Zetia - Follows with [facility]     Length of Stay: [address] [name]  02/03/2021, 12:25 PM  Advanced Heart Failure Team Pager [phone] (M-F; 7a - 4p)  Please contact [facility] for night-coverage after hours (4p -7a ) and weekends on [email or web address]   Patient seen and examined with the above-signed Advanced Practice Provider and/or Housestaff. I personally reviewed laboratory data, imaging studies and relevant notes. I independently examined the patient and formulated the important aspects of the plan. I have edited the note to reflect any of my changes or salient points. I have personally discussed the plan with the patient and/or family.  87 y/o with HTN, CAD, OSA and diastolic HF admitted with respiratory distress likely due to ADHF.   Echo shows  EF ~ 50%. She has been  diuresed and feels better but weight is unchanged.   General:  Elderly  Well appearing. No resp difficulty HEENT: normal Neck: supple. JVP hard to see ? 6-7. Carotids 2+ bilat; no bruits. No lymphadenopathy or thryomegaly appreciated. Cor: PMI nondisplaced. Regular rate & rhythm. No rubs, gallops or murmurs. Lungs: clear Abdomen: soft, nontender, nondistended. No hepatosplenomegaly. No bruits or masses. Good bowel sounds. Extremities: no cyanosis, clubbing, rash, edema Neuro: alert & orientedx3, cranial nerves grossly intact. moves all 4 extremities w/o difficulty. Affect pleasant  It is very hard to assess volume status on exam. Clinically she looks and feels much better but weight has not changed. Will plan repeat RHC tomorrow to reassess pulmonary pressures and volume status. If volume status looks good may be ok for d/c home tomorrow.   Glori Bickers, MD  5:05 PM

## 2021-02-03 NOTE — Consult Note (Addendum)
Advanced Heart Failure Team Consult Note   Primary Physician: [name], MD PCP-Cardiologist:  Pixie Casino, MD  [facility]: Dr. Haroldine Laws   Reason for Consultation: acute on chronic systolic and diastolic heart failure   HPI:    [name] is seen today for evaluation of acute on chronic systolic and diastolic heart failure at the request of Dr. Marlou Porch, Cardiology.  [name] Vanloan Griffinis a 87 y.o.femalewith CAD, HTN, atrial fibrillation/A flutter and chronic prior systolic heart failure, (which was likely rate related) and diastolic heart failure. She was referred by Dr. Lovena Le for further evaluation of Pulmonary HTN.   Underwent 2D echo in 3/15 which showed EF 60-65% mild LVH. RV was normal. No significant valvular abnormalities.   Underwent R/L cath 05/07/14 which showed stable CAD and significant PH with normal PVR.   In 4/21 saw Dr. Scot Dock and had LE angio with severe PAD underwent stenting of 70% R external iliac  Admitted 05/21/17-05/27/17 with COPD exacerbation, acute on chronic diastolic CHF. Diuresed 5L with IV Lasix. Discharge weight 173 pounds.  Myoview 9/20   Nuclear stress EF: 56%.  There was no ST segment deviation noted during stress.  Defect 1: There is a medium defect of moderate severity present in the mid inferolateral, apical inferior and apical lateral location.  Findings consistent with possible mild ischemia in the apical inferior and inferolateral regions.  The left ventricular ejection fraction is normal (55-65%).   Admitted in 5/21 with NSTEMI hstrop peak 1,455. Echo 5/21: EF 50-55%  Cath 05/16/20 with  1. Severe 3 vessel obstructive CAD 2. Severe pulmonary HTN 3. Successful PCI of the proximal LCx/OM1 with DES x 1 with IVUS guidance 4. Successful PCI of the proximal LAD with orbital atherectomy and DES x 1  Ao = 172/68 (108) LV = 166/10 RA = 6 RV = 75/6 PA = 77/26 (45) PCW = 10 Fick cardiac output/index = 4.0/2.2 PVR =  8.8 WU FA sat = 99% PA sat = 67%, 73%  Presented to [facility] on 2/4 w/ CC of increased dyspnea and LEE, in the setting of dietary indiscretion w/ sodium. COVID negative. CXR w/ mild interstitial edema and small bilateral pleural effusions. D-dimer elevated 0.74. BNP 171. Hs Troponin 20>>20>>20. Chest CT negative for PE but did show mediastinal adenopathy which may be inflammatory in etiology, but neoplasm cannot be excluded. Follow-up CT scan in 3 months is recommended to ensure stability or resolution. LE venous dopplers also negative for DVT.   She was admitted by IM and started on IV Lasix. Repeat echo was done and showed reduced LVEF ~40-45%. Cardiology consulted. Echo was personally reviewed by Dr. Margaretann Loveless who felt EF appears grossly preserved, and likely no significant change from the prior echocardiogram EF, may be slightly reduced from prior. RV mildly reduced.  She is net negative 2.4L since admit. SCr stable, 0.84. K 3.9   LEE has resolved but she remains fluid overloaded on exam. Still on 6L [place]. In Afib HR 70s on tele. Denies CP.    Echo 01/30/21 Left ventricular ejection fraction, by estimation, is 40 to 45%. The left ventricle has mildly decreased function. The left ventricle demonstrates global hypokinesis. There is moderate left ventricular hypertrophy. Left ventricular diastolic function could not be evaluated. 2. Right ventricular systolic function is mildly reduced. The right ventricular size is normal. 3. Left atrial size was moderately dilated. 4. The mitral valve is abnormal. Mild mitral valve regurgitation. Moderate mitral annular calcification. 5. The aortic  valve is tricuspid. Aortic valve regurgitation is not visualized.  Review of Systems: [y] = yes, [ ]  = no   . General: Weight gain [ ] ; Weight loss [ ] ; Anorexia [ ] ; Fatigue [ ] ; Fever [ ] ; Chills [ ] ; Weakness [ ]   . Cardiac: Chest pain/pressure [ ] ; Resting SOB [ ] ; Exertional SOB [Y ]; Orthopnea [ Y]; Pedal Edema  [Y ]; Palpitations [ ] ; Syncope [ ] ; Presyncope [ ] ; Paroxysmal nocturnal dyspnea[ ]   . Pulmonary: Cough [ ] ; Wheezing[ ] ; Hemoptysis[ ] ; Sputum [ ] ; Snoring [ ]   . GI: Vomiting[ ] ; Dysphagia[ ] ; Melena[ ] ; Hematochezia [ ] ; Heartburn[ ] ; Abdominal pain [ ] ; Constipation [ ] ; Diarrhea [ ] ; BRBPR [ ]   . GU: Hematuria[ ] ; Dysuria [ ] ; Nocturia[ ]   . Vascular: Pain in legs with walking [ ] ; Pain in feet with lying flat [ ] ; Non-healing sores [ ] ; Stroke [ ] ; TIA [ ] ; Slurred speech [ ] ;  . Neuro: Headaches[ ] ; Vertigo[ ] ; Seizures[ ] ; Paresthesias[ ] ;Blurred vision [ ] ; Diplopia [ ] ; Vision changes [ ]   . Ortho/Skin: Arthritis [ ] ; Joint pain [ ] ; Muscle pain [ ] ; Joint swelling [ ] ; Back Pain [ ] ; Rash [ ]   . Psych: Depression[ ] ; Anxiety[ ]   . Heme: Bleeding problems [ ] ; Clotting disorders [ ] ; Anemia [ ]   . Endocrine: Diabetes [ ] ; Thyroid dysfunction[ ]   Home Medications Prior to Admission medications   Medication Sig Start Date End Date Taking? Authorizing Provider  acetaminophen (TYLENOL) 500 MG tablet Take 1,000 mg by mouth every 6 (six) hours as needed (pain).   Yes [provider]  amLODipine (NORVASC) 10 MG tablet Take 1 tablet (10 mg total) by mouth daily. 12/18/20  Yes Zyanne Schumm, Shaune Pascal, MD  CALCIUM-MAG-VIT C-VIT D PO Take 1 tablet by mouth daily.    Yes [provider]  carvedilol (COREG) 6.25 MG tablet TAKE 1 TABLET BY MOUTH  TWICE DAILY WITH MEALS Patient taking differently: Take 6.25 mg by mouth 2 (two) times daily with a meal. 12/22/20  Yes Munachimso Palin, Shaune Pascal, MD  cholecalciferol (VITAMIN D) 1000 units tablet Take 1,000 Units by mouth daily after supper.   Yes [provider]  clopidogrel (PLAVIX) 75 MG tablet TAKE 1 TABLET BY MOUTH ONCE DAILY WITH BREAKFAST Patient taking differently: Take 75 mg by mouth daily. 12/17/20  Yes Hilty, Nadean Corwin, MD  ELIQUIS 5 MG TABS tablet TAKE 1 TABLET BY MOUTH  TWICE DAILY Patient taking differently: Take 5 mg by  mouth 2 (two) times daily. 12/03/20  Yes Dorea Duff, Shaune Pascal, MD  ezetimibe (ZETIA) 10 MG tablet Take 1 tablet (10 mg total) by mouth daily. 08/12/20  Yes Hilty, Nadean Corwin, MD  furosemide (LASIX) 80 MG tablet TAKE 1 TABLET BY MOUTH  TWICE DAILY Patient taking differently: Take 80 mg by mouth 2 (two) times daily. 12/22/20  Yes Kregg Cihlar, Shaune Pascal, MD  Insulin Lispro Prot & Lispro (HUMALOG MIX 75/25 KWIKPEN) (75-25) 100 UNIT/ML Kwikpen Inject 36 units into the skin daily with breakfast Patient taking differently: 36 Units in the morning and at bedtime. Inject 36 units into the skin daily with breakfast 12/09/20  Yes Renato Shin, MD  Multiple Vitamin (MULTIVITAMIN WITH MINERALS) TABS tablet Take 1 tablet by mouth daily. Centrum Silver   Yes [provider]  nitroGLYCERIN (NITROSTAT) 0.4 MG SL tablet Place 1 tablet (0.4 mg total) under the tongue every 5 (five) minutes as needed for chest  pain. Reported on 01/28/2016 01/03/20  Yes Brennon Otterness, Shaune Pascal, MD  potassium chloride SA (KLOR-CON) 20 MEQ tablet Take 20 mEq by mouth 2 (two) times daily.   Yes [provider]  empagliflozin (JARDIANCE) 10 MG TABS tablet Take 10 mg by mouth daily before breakfast. Patient not taking: No sig reported 10/26/19   Beya Tipps, Shaune Pascal, MD  Evolocumab (REPATHA SURECLICK) 258 MG/ML SOAJ Inject 1 Dose into the skin every 14 (fourteen) days. Patient not taking: No sig reported 12/25/20   Pixie Casino, MD  glucose blood [facility] VERIO) test strip USE TO MONITOR GLUCOSE  LEVELS TWICE DAILY 10/03/20   Renato Shin, MD  Lancets (ONETOUCH DELICA PLUS [id number]) [place] USE TO MONITOR GLUCOSE  LEVELS TWICE DAILY 05/03/20   Renato Shin, MD    Past Medical History: Past Medical History:  Diagnosis Date  . Anemia    iron defficiency  . Anginal pain ([place])   . Arthritis    "knees, feet, hands; joints" (05/27/2015)  . Asthma   . Atrial fibrillation or flutter    s/p RFCA 7/08;   s/p DCCV in past;   previously on  amiodarone;  amio stopped due to lung toxicity  . CAD (coronary artery disease)    s/p NSTEMI tx with BMS to OM1 3/08;  cath 3/08: pOM 99% tx with PCI, pLAD 20%, ? mod stenosis at the AM  . CAP (community acquired pneumonia) 05/24/2015  . CHF (congestive heart failure) ([place])   . Chronic diastolic heart failure (HCC)    echo 11/11:  EF 55-60%, severe LVH, mod LAE, mild MR, mildly increased PASP  . COPD (chronic obstructive pulmonary disease) ([place])   . Degenerative joint disease   . Dystrophy, corneal stromal   . Heart murmur   . HLD (hyperlipidemia)   . HTN (hypertension)    essential nos  . Hypopotassemia    PMH of  . Muscle pain   . Myocardial infarction ([place]) 2008  . OSA on CPAP   . Osteoporosis   . Pneumonia 05/27/2015  . Protein calorie malnutrition ([place])   . Rash and nonspecific skin eruption    both arms,awaiting bio   dr Delman Cheadle  . Seasonal allergies   . Shortness of breath dyspnea   . Type II diabetes mellitus (HCC)    Dr Loanne Drilling    Past Surgical History: Past Surgical History:  Procedure Laterality Date  . A FLUTTER ABLATION     Dr Lovena Le  . ABDOMINAL AORTOGRAM W/LOWER EXTREMITY Bilateral 04/11/2020   Procedure: ABDOMINAL AORTOGRAM W/LOWER EXTREMITY;  Surgeon: Angelia Mould, MD;  Location: [place] [facility];  Service: Cardiovascular;  Laterality: Bilateral;  . CATARACT EXTRACTION W/ INTRAOCULAR LENS  IMPLANT, BILATERAL Bilateral   . CHOLECYSTECTOMY    . COLONOSCOPY  6/07   2 polyps Dr. Kinnie Feil in [place]  . colonoscopy with polypectomy      X 2; [facility]  . CORNEAL TRANSPLANT Bilateral   . CORONARY ANGIOPLASTY WITH STENT PLACEMENT  02/2007   BMS w/Dr Lovena Le  . CORONARY ATHERECTOMY N/A 05/16/2020   Procedure: CORONARY ATHERECTOMY;  Surgeon: Martinique, Peter M, MD;  Location: [place] [facility];  Service: Cardiovascular;  Laterality: N/A;  . CORONARY BALLOON ANGIOPLASTY N/A 05/16/2020   Procedure: CORONARY BALLOON ANGIOPLASTY;  Surgeon: Martinique, Peter M, MD;   Location: [place] [facility];  Service: Cardiovascular;  Laterality: N/A;  . CORONARY STENT INTERVENTION N/A 05/16/2020   Procedure: CORONARY STENT INTERVENTION;  Surgeon: Martinique, Peter M, MD;  Location: [facility];  Service: Cardiovascular;  Laterality: N/A;  . EYE SURGERY    . gravid 2 para 2    . INTRAVASCULAR ULTRASOUND/IVUS N/A 05/16/2020   Procedure: Intravascular Ultrasound/IVUS;  Surgeon: Jordan, Peter M, MD;  Location: [facility];  Service: Cardiovascular;  Laterality: N/A;  . KNEE CARTILAGE SURGERY Right   . LEFT AND RIGHT HEART CATHETERIZATION WITH CORONARY ANGIOGRAM N/A 05/07/2014   Procedure: LEFT AND RIGHT HEART CATHETERIZATION WITH CORONARY ANGIOGRAM;  Surgeon: Peter M Jordan, MD;  Location: [facility];  Service: Cardiovascular;  Laterality: N/A;  . PERIPHERAL VASCULAR INTERVENTION  04/11/2020   Procedure: PERIPHERAL VASCULAR INTERVENTION;  Surgeon: Dickson, Christopher S, MD;  Location: [facility];  Service: Cardiovascular;;  . RIGHT/LEFT HEART CATH AND CORONARY ANGIOGRAPHY N/A 05/16/2020   Procedure: RIGHT/LEFT HEART CATH AND CORONARY ANGIOGRAPHY;  Surgeon: Haedyn Breau R, MD;  [facility];  Service: Cardiovascular;  Laterality: N/A;  . TOTAL KNEE ARTHROPLASTY Right 06/28/2016   Procedure: TOTAL KNEE ARTHROPLASTY;  Surgeon: Frank Rowan, MD;  Location: [facility];  Service: Orthopedics;  Laterality: Right;    Family History: Family History  Problem Relation Age of Onset  . Diabetes Mother   . Hypertension Mother   . Transient ischemic attack Mother   . Arthritis Mother   . Heart attack Father 72  . Arthritis Father   . Hypertension Father   . Breast cancer Maternal Aunt   . Arthritis Maternal Aunt     Social History: Social History   Socioeconomic History  . Marital status: Widowed    Spouse name: Not on file  . Number of children: 2  . Years of education: 16  . Highest education level: Bachelor's degree (e.g., BA, AB, BS)   Occupational History  . Occupation: Teacher/ Retired  Tobacco Use  . Smoking status: Former Smoker    Packs/day: 0.25    Years: 18.00    Pack years: 4.50    Types: Cigarettes    Quit date: 12/27/1968    Years since quitting: 52.1  . Smokeless tobacco: Never Used  . Tobacco comment: smoked 1952- 1970, up to 1 pp week  Vaping Use  . Vaping Use: Never used  Substance and Sexual Activity  . Alcohol use: Yes    Comment: occ  . Drug use: No  . Sexual activity: Never  Other Topics Concern  . Not on file  Social History Narrative   Teacher. Widowed. Rarely drinks cafeine.    Social Determinants of Health   Financial Resource Strain: Low Risk   . Difficulty of Paying Living Expenses: Not very hard  Food Insecurity: Not on file  Transportation Needs: Not on file  Physical Activity: Not on file  Stress: Not on file  Social Connections: Not on file    Allergies:  Allergies  Allergen Reactions  . Levaquin [Levofloxacin] Shortness Of Breath and Swelling    angioedema  . Lobster [Shellfish Allergy] Other (See Comments)    angioedema  . Penicillins Rash    Has patient had a PCN reaction causing immediate rash, facial/tongue/throat swelling, SOB or lightheadedness with hypotension: Yes Has patient had a PCN reaction causing severe rash involving mucus membranes or skin necrosis: No Has patient had a PCN reaction that required hospitalization: No Has patient had a PCN reaction occurring within the last 10 years: No If all of the above answers are "NO", then may proceed with Cephalosporin use.  . Valsartan Other (See Comments)      REACTION: angioedema  . Codeine Other (See Comments)    Mental status changes  . Lipitor [Atorvastatin] Other (See Comments)    weakness  . Oxycodone Other (See Comments)    hallucinations  . Statins     Made her too weak  . Tramadol   . Amlodipine Besylate Other (See Comments)    REACTION: tingling in lips & gum edema 7/12: talked to patient,  states she is tolerating well  . Crestor [Rosuvastatin Calcium] Rash  . Tramadol Hcl Nausea And Vomiting    Objective:    Vital Signs:   Temp:  [98.2 F (36.8 C)-98.6 F (37 C)] 98.6 F (37 C) (02/08 0737) Pulse Rate:  [68-80] 73 (02/08 0737) Resp:  [18-23] 20 (02/08 0737) BP: (150-175)/(61-72) 155/67 (02/08 0737) SpO2:  [96 %-98 %] 96 % (02/08 0737)    Weight change: Filed Weights   01/31/21 2044 02/02/21 0802  Weight: 75.9 kg 75.9 kg    Intake/Output:   Intake/Output Summary (Last 24 hours) at 02/03/2021 1225 Last data filed at 02/03/2021 8502 Gross per 24 hour  Intake --  Output 800 ml  Net -800 ml      Physical Exam    General:  Well appearing elderly F, looks younger than actual age. No resp difficulty HEENT: normal Neck: supple. JVP elevated to jaw . Carotids 2+ bilat; no bruits. No lymphadenopathy or thyromegaly appreciated. Cor: PMI nondisplaced. Irregularly irregular rhythm. No rubs, gallops or murmurs. Lungs: faint bibasilar crackles, L>R Abdomen: soft, nontender, nondistended. No hepatosplenomegaly. No bruits or masses. Good bowel sounds. Extremities: no cyanosis, clubbing, rash, edema Neuro: alert & orientedx3, cranial nerves grossly intact. moves all 4 extremities w/o difficulty. Affect pleasant   Telemetry   Atrial fibrillation 70s   EKG    No new EKG to review   Labs   Basic Metabolic Panel: Recent Labs  Lab 01/30/21 1037 01/31/21 1225 02/01/21 0105 02/02/21 0059 02/03/21 0157  NA 141 138 140 136 138  K 4.6 4.3 4.1 4.2 3.9  CL 104  --  97* 95* 94*  CO2 29  --  33* 32 32  GLUCOSE 165*  --  149* 138* 167*  BUN 19  --  15 19 20   CREATININE 1.01*  --  0.91 0.97 0.84  CALCIUM 9.8  --  9.0 8.9 9.2    Liver Function Tests: Recent Labs  Lab 01/31/21 0758 02/01/21 0105 02/02/21 0059  AST 33 23 23  ALT 28 19 15   ALKPHOS 101 76 72  BILITOT 1.0 1.1 1.3*  PROT 7.9 6.4* 6.0*  ALBUMIN 3.7 3.0* 2.8*   No results for input(s): LIPASE,  AMYLASE in the last 168 hours. No results for input(s): AMMONIA in the last 168 hours.  CBC: Recent Labs  Lab 01/30/21 1037 01/31/21 1225 02/01/21 0105 02/02/21 0059 02/03/21 0157  WBC 4.9  --  5.3 5.2 4.2  HGB 15.7* 18.4* 15.1* 14.1 15.7*  HCT 51.5* 54.0* 48.7* 46.1* 51.9*  MCV 98.1  --  98.2 98.1 96.6  PLT 174  --  165 141* 145*    Cardiac Enzymes: No results for input(s): CKTOTAL, CKMB, CKMBINDEX, TROPONINI in the last 168 hours.  BNP: BNP (last 3 results) Recent Labs    06/09/20 1130 12/18/20 1208 01/30/21 1037  BNP 225.6* 134.0* 171.8*    ProBNP (last 3 results) No results for input(s): PROBNP in the last 8760 hours.   CBG: Recent Labs  Lab 02/02/21 1222 02/02/21 1621 02/02/21 2137 02/03/21 0735 02/03/21  1218  GLUCAP 227* 196* 259* 161* 275*    Coagulation Studies: No results for input(s): LABPROT, INR in the last 72 hours.   Imaging    No results found.   Medications:     Current Medications: . apixaban  5 mg Oral BID  . carvedilol  6.25 mg Oral BID WC  . cholecalciferol  1,000 Units Oral QPC supper  . clopidogrel  75 mg Oral Daily  . ezetimibe  10 mg Oral Daily  . furosemide  80 mg Intravenous Q12H  . insulin aspart  0-5 Units Subcutaneous QHS  . insulin aspart  0-9 Units Subcutaneous TID WC  . insulin glargine  15 Units Subcutaneous QHS  . sodium chloride flush  3 mL Intravenous Q12H     Infusions: . sodium chloride       Assessment/Plan   1) Acute on Chronic Combined Systolic and Diastolic Heart Failure -Echo 02/2015 EF 60-65%, LA severely dilated, Echo 10/2017: EF 65-70%, grade 3 DD - Echo 5/21 EF 50-55% - Admitted w/ dyspnea and fluid overload, not improved w/ titration of home regimen - Echo this admit EF 40-45%, RV mildly reduced  - Volume improved w/ IV Lasix but remains mildly SOB - Continue IV Lasix 80 mg bid - Will need repeat RHC prior tomorrow - Not on Entresto due to h/o angioedema with ARB - resume home  Jardiance   2) PAH - Moderate to severe by cath 5/21. PVR 8.8 - suspect multifactorial  - wearing CPAP qhs  - Plan was to repeat RHC after ~2 months of compliance w/ CPAP thearpy  - Will set up [place] prior tomorrow  3 CAD - H/o of stent in 2008 - NSTEMI 5/21. Cath as above. S/p PCI to LAD & OM - No s/s angina - Hs trop 20>>20>>20, not c/w ACS  - On Plavix, apixaban - Unable to tolerate statins. On Repatha/zetia per [facility]   4) PAD - severe  - s/p stenting to R external iliac with Dr. Scot Dock - Continues with mild claudication    5) Paroxysmal atrial fib/flutter -remains in AFL today. Rate controlled. Asx This patients CHA2DS2-VASc Score and unadjusted Ischemic Stroke Rate (% per year) is equal to 7.2 % stroke rate/year from a score of 5 Above score calculated as 2 points each if present [Age >75, or Stroke/TIA/TE] - Continue Eliquis. No bleeding  6) OSA/COPD - Follows with Dr. Halford Chessman.  - Compliant with CPAP  7) HTN - SBPs elevated ~150s - restart home amlodipine   8) DM2 - SSI  - On Jardiance 5mg  daily at home (unable to tolerate 10mg  daily)  9) HL  - Unable to tolerate statins. On Repatha/Zetia - Follows with [facility]     Length of Stay: [address] [name]  02/03/2021, 12:25 PM  Advanced Heart Failure Team Pager [phone] (M-F; 7a - 4p)  Please contact [facility] for night-coverage after hours (4p -7a ) and weekends on [email or web address]   Patient seen and examined with the above-signed Advanced Practice Provider and/or Housestaff. I personally reviewed laboratory data, imaging studies and relevant notes. I independently examined the patient and formulated the important aspects of the plan. I have edited the note to reflect any of my changes or salient points. I have personally discussed the plan with the patient and/or family.  87 y/o with HTN, CAD, OSA and diastolic HF admitted with respiratory distress likely due to ADHF.   Echo shows  EF ~ 50%. She has been  diuresed and feels better but weight is unchanged.   General:  Elderly  Well appearing. No resp difficulty HEENT: normal Neck: supple. JVP hard to see ? 6-7. Carotids 2+ bilat; no bruits. No lymphadenopathy or thryomegaly appreciated. Cor: PMI nondisplaced. Regular rate & rhythm. No rubs, gallops or murmurs. Lungs: clear Abdomen: soft, nontender, nondistended. No hepatosplenomegaly. No bruits or masses. Good bowel sounds. Extremities: no cyanosis, clubbing, rash, edema Neuro: alert & orientedx3, cranial nerves grossly intact. moves all 4 extremities w/o difficulty. Affect pleasant  It is very hard to assess volume status on exam. Clinically she looks and feels much better but weight has not changed. Will plan repeat RHC tomorrow to reassess pulmonary pressures and volume status. If volume status looks good may be ok for d/c home tomorrow.   Glori Bickers, MD  5:05 PM

## 2021-02-03 NOTE — Progress Notes (Signed)
PROGRESS NOTE  TINNIE KUNIN RAQ:[id number] DOB: January 09, 1931 DOA: 01/30/2021 PCP: [name], MD   LOS: 3 days   Brief Narrative / Interim history: This is an 87 year old female with history of chronic combined CHF, CAD, prior MI in 2008, A. fib/flutter on Eliquis, PAD status post right external iliac artery stenting, DM 2, hypertension, hyperlipidemia, COPD, OSA on CPAP who came in with shortness of breath, inability to lay flat and worsening edema.  She follows with cardiology as an outpatient, most recently had a left heart cath in May of last year which showed severe three-vessel obstructive CAD status post PCI of the proximal LCx/OM1 with DES x 1 with IVUS guidance, and PCI of the proximal LAD with orbital atherectomy and DES x 1. She is currently on Plavix 75 mg daily and Eliquis 5 mg twice daily.    Patient tells me that over the last 1 to 2 months she has had progressive dyspnea, she is unable to lay flat and now barely able to take a few steps without having to stop.  She reports compliance with her diuretics however currently does not have a scale and cannot weigh herself.  She used to have 1 but moved into a retirement community and doesn't have it anymore.  She also tells me that the food that she is being given in her retirement community does not adhere to low-sodium diet as it is being cooked for 100s of residents.  In the ED chest x-ray showed concern for interstitial edema and small bilateral pleural effusions, and was hypoxic requiring BiPAP placement  Subjective / 24h Interval events: Feels very short of breath still, but appreciates slight improvement.  No chest pain  Assessment & Plan: Principal Problem Acute hypoxic respiratory failure due to acute on chronic combined CHF  -patient was admitted to the hospital, hypoxic initially requiring BiPAP.  A 2D echo done 2/5 showed EF 40-45%, LV has global hypokinesis, LVH and mildly reduced RV function.  The LV function seems to be  slightly worse based on the most recent echo in May 2021 -Remains on supplemental oxygen with 3 L.  Started on Lasix, renal function is stable and will continue today. Increased to 80 mg 2/7. Weight has remained stable but weight today is pending -There is certainly a degree of high salt intake but it seems to be outside of patient's control as the foods are being cooked for her in her retirement community -Continue strict ins and outs, daily weights -Cardiology following, appreciate input  Active Problems Coronary artery disease, prior MI/stenting, PAD -No chest pain, no symptoms to suggest active events.  Troponin is mildly elevated likely demand -Continue Plavix  Chronic A. fib/flutter -Continue Coreg, Eliquis  COPD -No wheezing, this appears stable, she is on oxygen but probably due to #1  OSA on CPAP -Continue home CPAP  DM2 -Continue Lantus, sliding scale, CBGs acceptable  CBG (last 3)  Recent Labs    02/02/21 1621 02/02/21 2137 02/03/21 0735  GLUCAP 196* 259* 161*   Hyperlipidemia -Continue home medications  Incidental finding of mediastinal lymphadenopathy on the CT scan -Follow-up CT scan in 3 months  Scheduled Meds: . apixaban  5 mg Oral BID  . carvedilol  6.25 mg Oral BID WC  . cholecalciferol  1,000 Units Oral QPC supper  . clopidogrel  75 mg Oral Daily  . ezetimibe  10 mg Oral Daily  . furosemide  80 mg Intravenous Q12H  . insulin aspart  0-5 Units Subcutaneous  QHS  . insulin aspart  0-9 Units Subcutaneous TID WC  . insulin glargine  15 Units Subcutaneous QHS  . sodium chloride flush  3 mL Intravenous Q12H   Continuous Infusions: . sodium chloride     PRN Meds:.sodium chloride, acetaminophen, ipratropium-albuterol, nitroGLYCERIN, ondansetron (ZOFRAN) IV, sodium chloride flush, traZODone  Diet Orders (From admission, onward)    Start     Ordered   02/01/21 1200  Diet 2 gram sodium Room service appropriate? Yes; Fluid consistency: Thin  Diet  effective now       Question Answer Comment  Room service appropriate? Yes   Fluid consistency: Thin      02/01/21 1200          DVT prophylaxis: SCDs Start: 01/31/21 1412 apixaban (ELIQUIS) tablet 5 mg     Code Status: DNR  Family Communication: no family at bedside, called son went straight to voicemail  Status is: Inpatient  Remains inpatient appropriate because:Inpatient level of care appropriate due to severity of illness  Dispo: The patient is from: Home              Anticipated d/c is to: Home              Anticipated d/c date is: 2 days              Patient currently is not medically stable to d/c.   Difficult to place patient No  Level of care: Progressive  Consultants:  Cardiology   Procedures:  2D echo  Microbiology  None   Antimicrobials: None     Objective: Vitals:   02/02/21 1600 02/02/21 2055 02/03/21 0000 02/03/21 0737  BP: (!) 150/61 (!) 159/63 (!) 175/72 (!) 155/67  Pulse: 72 80 68 73  Resp: 18 20 (!) 23 20  Temp:  98.2 F (36.8 C)  98.6 F (37 C)  TempSrc:  Oral  Oral  SpO2: 97% 96% 98% 96%  Weight:      Height:        Intake/Output Summary (Last 24 hours) at 02/03/2021 0956 Last data filed at 02/03/2021 5277 Gross per 24 hour  Intake --  Output 800 ml  Net -800 ml   Filed Weights   01/31/21 2044 02/02/21 0802  Weight: 75.9 kg 75.9 kg    Examination:  Constitutional: Uncomfortable, tachypneic Eyes: No scleral icterus ENMT: Moist mucous membranes Neck: normal, supple Respiratory: Crackles at the bases, no wheezing, moves air well Cardiovascular: Irregular, 1+ pitting edema Abdomen: Soft, nontender, nondistended, bowel sounds positive Musculoskeletal: no clubbing / cyanosis.  Skin: No rashes seen Neurologic: No focal deficits, equal strength   Data Reviewed: I have independently reviewed following labs and imaging studies   CBC: Recent Labs  Lab 01/30/21 1037 01/31/21 1225 02/01/21 0105 02/02/21 0059  02/03/21 0157  WBC 4.9  --  5.3 5.2 4.2  HGB 15.7* 18.4* 15.1* 14.1 15.7*  HCT 51.5* 54.0* 48.7* 46.1* 51.9*  MCV 98.1  --  98.2 98.1 96.6  PLT 174  --  165 141* 824*   Basic Metabolic Panel: Recent Labs  Lab 01/30/21 1037 01/31/21 1225 02/01/21 0105 02/02/21 0059 02/03/21 0157  NA 141 138 140 136 138  K 4.6 4.3 4.1 4.2 3.9  CL 104  --  97* 95* 94*  CO2 29  --  33* 32 32  GLUCOSE 165*  --  149* 138* 167*  BUN 19  --  15 19 20   CREATININE 1.01*  --  0.91 0.97 0.84  CALCIUM 9.8  --  9.0 8.9 9.2   Liver Function Tests: Recent Labs  Lab 01/31/21 0758 02/01/21 0105 02/02/21 0059  AST 33 23 23  ALT 28 19 15   ALKPHOS 101 76 72  BILITOT 1.0 1.1 1.3*  PROT 7.9 6.4* 6.0*  ALBUMIN 3.7 3.0* 2.8*   Coagulation Profile: No results for input(s): INR, PROTIME in the last 168 hours. HbA1C: No results for input(s): HGBA1C in the last 72 hours. CBG: Recent Labs  Lab 02/02/21 0801 02/02/21 1222 02/02/21 1621 02/02/21 2137 02/03/21 0735  GLUCAP 197* 227* 196* 259* 161*    Recent Results (from the past 240 hour(s))  SARS Coronavirus 2 by RT PCR (hospital order, performed in [facility] hospital lab) Nasopharyngeal Nasopharyngeal Swab     Status: None   Collection Time: 01/31/21  2:49 AM   Specimen: Nasopharyngeal Swab  Result Value Ref Range Status   SARS Coronavirus 2 NEGATIVE NEGATIVE Final    Comment: (NOTE) SARS-CoV-2 target nucleic acids are NOT DETECTED.  The SARS-CoV-2 RNA is generally detectable in upper and lower respiratory specimens during the acute phase of infection. The lowest concentration of SARS-CoV-2 viral copies this assay can detect is 250 copies / mL. A negative result does not preclude SARS-CoV-2 infection and should not be used as the sole basis for treatment or other patient management decisions.  A negative result may occur with improper specimen collection / handling, submission of specimen other than nasopharyngeal swab, presence of viral  mutation(s) within the areas targeted by this assay, and inadequate number of viral copies (<250 copies / mL). A negative result must be combined with clinical observations, patient history, and epidemiological information.  Fact Sheet for Patients:   StrictlyIdeas.no  Fact Sheet for Healthcare Providers: BankingDealers.co.za  This test is not yet approved or  cleared by the [place] FDA and has been authorized for detection and/or diagnosis of SARS-CoV-2 by FDA under an Emergency Use Authorization (EUA).  This EUA will remain in effect (meaning this test can be used) for the duration of the COVID-19 declaration under Section 564(b)(1) of the Act, 21 U.S.C. section 360bbb-3(b)(1), unless the authorization is terminated or revoked sooner.  Performed at [facility], [place] [address], [place], [place] [address]      Radiology Studies: No results found.  Marzetta Board, MD, PhD [facility]  Between 7 am - 7 pm I am available, please contact me via Amion or Securechat  Between 7 pm - 7 am I am not available, please contact night coverage MD/APP via Amion

## 2021-02-03 NOTE — Progress Notes (Addendum)
Progress Note  Patient Name: [name] Date of Encounter: 02/03/2021  [facility] Cardiologist: Pixie Casino, MD   Subjective   Breathing improving but not at baseline yet.  Has orthopnea.  Significant diuresis with increased Lasix.  Inpatient Medications    Scheduled Meds: . apixaban  5 mg Oral BID  . carvedilol  6.25 mg Oral BID WC  . cholecalciferol  1,000 Units Oral QPC supper  . clopidogrel  75 mg Oral Daily  . ezetimibe  10 mg Oral Daily  . furosemide  80 mg Intravenous Q12H  . insulin aspart  0-5 Units Subcutaneous QHS  . insulin aspart  0-9 Units Subcutaneous TID WC  . insulin glargine  15 Units Subcutaneous QHS  . sodium chloride flush  3 mL Intravenous Q12H   Continuous Infusions: . sodium chloride     PRN Meds: sodium chloride, acetaminophen, ipratropium-albuterol, nitroGLYCERIN, ondansetron (ZOFRAN) IV, sodium chloride flush, traZODone   Vital Signs    Vitals:   02/02/21 1600 02/02/21 2055 02/03/21 0000 02/03/21 0737  BP: (!) 150/61 (!) 159/63 (!) 175/72 (!) 155/67  Pulse: 72 80 68 73  Resp: 18 20 (!) 23 20  Temp:  98.2 F (36.8 C)  98.6 F (37 C)  TempSrc:  Oral  Oral  SpO2: 97% 96% 98% 96%  Weight:      Height:        Intake/Output Summary (Last 24 hours) at 02/03/2021 1024 Last data filed at 02/03/2021 6144 Gross per 24 hour  Intake --  Output 800 ml  Net -800 ml   Last 3 Weights 02/02/2021 01/31/2021 12/18/2020  Weight (lbs) 167 lb 6.4 oz 167 lb 5.3 oz 163 lb 6.4 oz  Weight (kg) 75.932 kg 75.9 kg 74.118 kg      Telemetry    Atrial flutter with variable rate- Personally Reviewed  ECG    No new tracing  Physical Exam   GEN: No acute distress.   Neck: No JVD Cardiac:  Irregular, no murmurs, rubs, or gallops.  Respiratory:  Faint Rales GI: Soft, nontender, non-distended  MS: No edema; No deformity. Neuro:  Nonfocal  Psych: Normal affect   Labs    High Sensitivity Troponin:   Recent Labs  Lab 01/31/21 0758  01/31/21 0950 01/31/21 1400 01/31/21 1759  TROPONINIHS 17 20* 20* 20*      Chemistry Recent Labs  Lab 01/31/21 0758 01/31/21 1225 02/01/21 0105 02/02/21 0059 02/03/21 0157  NA  --    < > 140 136 138  K  --    < > 4.1 4.2 3.9  CL  --   --  97* 95* 94*  CO2  --   --  33* 32 32  GLUCOSE  --   --  149* 138* 167*  BUN  --   --  15 19 20   CREATININE  --   --  0.91 0.97 0.84  CALCIUM  --   --  9.0 8.9 9.2  PROT 7.9  --  6.4* 6.0*  --   ALBUMIN 3.7  --  3.0* 2.8*  --   AST 33  --  23 23  --   ALT 28  --  19 15  --   ALKPHOS 101  --  76 72  --   BILITOT 1.0  --  1.1 1.3*  --   GFRNONAA  --   --  >60 57* >60  ANIONGAP  --   --  10 9 12    < > =  values in this interval not displayed.     Hematology Recent Labs  Lab 02/01/21 0105 02/02/21 0059 02/03/21 0157  WBC 5.3 5.2 4.2  RBC 4.96 4.70 5.37*  HGB 15.1* 14.1 15.7*  HCT 48.7* 46.1* 51.9*  MCV 98.2 98.1 96.6  MCH 30.4 30.0 29.2  MCHC 31.0 30.6 30.3  RDW 14.4 13.7 13.4  PLT 165 141* 145*    BNP Recent Labs  Lab 01/30/21 1037  BNP 171.8*     DDimer  Recent Labs  Lab 01/31/21 0950  DDIMER 0.74*     Radiology    VAS [place] LOWER EXTREMITY VENOUS (DVT)  Result Date: 02/01/2021  Lower Venous DVT Study Indications: Edema, and SOB.  Risk Factors: Extensive history of peripheral arterial disease. Anticoagulation: Eliquis. Comparison Study: Performing Technologist: Darlin Coco RDMS  Examination Guidelines: A complete evaluation includes B-mode imaging, spectral Doppler, color Doppler, and power Doppler as needed of all accessible portions of each vessel. Bilateral testing is considered an integral part of a complete examination. Limited examinations for reoccurring indications may be performed as noted. The reflux portion of the exam is performed with the patient in reverse Trendelenburg.  +---------+---------------+---------+-----------+----------+--------------+ RIGHT     CompressibilityPhasicitySpontaneityPropertiesThrombus Aging +---------+---------------+---------+-----------+----------+--------------+ CFV      Full           Yes      Yes                                 +---------+---------------+---------+-----------+----------+--------------+ SFJ      Full                                                        +---------+---------------+---------+-----------+----------+--------------+ FV Prox  Full                                                        +---------+---------------+---------+-----------+----------+--------------+ FV Mid   Full                                                        +---------+---------------+---------+-----------+----------+--------------+ FV DistalFull                                                        +---------+---------------+---------+-----------+----------+--------------+ PFV      Full                                                        +---------+---------------+---------+-----------+----------+--------------+ POP      Full           Yes      Yes                                 +---------+---------------+---------+-----------+----------+--------------+  PTV      Full                                                        +---------+---------------+---------+-----------+----------+--------------+ PERO     Full                                                        +---------+---------------+---------+-----------+----------+--------------+   +---------+---------------+---------+-----------+----------+--------------+ LEFT     CompressibilityPhasicitySpontaneityPropertiesThrombus Aging +---------+---------------+---------+-----------+----------+--------------+ CFV      Full           Yes      Yes                                 +---------+---------------+---------+-----------+----------+--------------+ SFJ      Full                                                         +---------+---------------+---------+-----------+----------+--------------+ FV Prox  Full                                                        +---------+---------------+---------+-----------+----------+--------------+ FV Mid   Full                                                        +---------+---------------+---------+-----------+----------+--------------+ FV DistalFull                                                        +---------+---------------+---------+-----------+----------+--------------+ PFV      Full                                                        +---------+---------------+---------+-----------+----------+--------------+ POP      Full           Yes      Yes                                 +---------+---------------+---------+-----------+----------+--------------+ PTV      Full                                                        +---------+---------------+---------+-----------+----------+--------------+   PERO     Full                                                        +---------+---------------+---------+-----------+----------+--------------+     Summary: RIGHT: - There is no evidence of deep vein thrombosis in the lower extremity.  - No cystic structure found in the popliteal fossa.  LEFT: - There is no evidence of deep vein thrombosis in the lower extremity.  - No cystic structure found in the popliteal fossa.  *See table(s) above for measurements and observations. Electronically signed by Monica Martinez MD on 02/01/2021 at 7:54:58 PM.    Final     Cardiac Studies   Echo 01/31/21 1. Left ventricular ejection fraction, by estimation, is 40 to 45%. The  left ventricle has mildly decreased function. The left ventricle  demonstrates global hypokinesis. There is moderate left ventricular  hypertrophy. Left ventricular diastolic function  could not be evaluated.  2. Right ventricular systolic function is  mildly reduced. The right  ventricular size is normal.  3. Left atrial size was moderately dilated.  4. The mitral valve is abnormal. Mild mitral valve regurgitation.  Moderate mitral annular calcification.  5. The aortic valve is tricuspid. Aortic valve regurgitation is not  visualized.   Patient Profile     87 y.o. female  with a hx of chronic diastolic heart failure (prior systolic heart failure with recovery of EF), coronary artery disease with prior MI in 2008 and more recently in 2021 with PCI to the proximal left circumflex/OM1 and PCI to the proximal LAD with orbital atherectomy and DES, atrial fibrillation/flutter on Eliquis, PAD status post right external iliac artery stenting, diabetes mellitus 2, hypertension, hyperlipidemia, COPD, OSA on CPAP with pulmonary hypertension seen for CHF exacerbation.  Assessment & Plan    1. Acute on chronic diastolic heart failure 2.  Hypercapnic respiratory failure 3.  Pulmonary hypertension (severe by cath 04/2020) -Echo this admission showed LV function of 40 to 45% and mildly reduced RV function. -Increase Lasix yesterday to 80 mg twice daily.  Patient noticed increased output with breathing improvement. -Net I&O -2.4 L.  -800 cc since yesterday and at least 700 cc in bag. -Continue Lasix 80mg  twice daily and Coreg 6.25 mg twice daily, - She has not been on Entresto in the past secondary to angioedema with angiotensin receptor blocker. -He is on lower dose of Jardiance 5 mg a day unable to tolerate 10 mg per Dr. Clayborne Dana prior visit.  2. CAD - Continue Plavix, Coreg and Zetia -No chest pain  3.  Persistent atrial fibrillation/flutter -Rate stable -Continue Coreg -Continue Eliquis for anticoagulation -No bleeding issue  4. HTN -Minimally elevated -Continue Coreg.  Consider increasing Coreg versus addition of spironolactone  5.  Hyperlipidemia -Statin intolerance.  On Repatha and Zetia.         For questions or updates,  please contact [place] Please consult [email or web address] for contact info under        SignedLeanor Kail, PA  02/03/2021, 10:24 AM    Personally seen and examined. Agree with above.  87 year old with chronic systolic/diastolic heart failure, multifactorial pulmonary hypertension.  Yesterday increased her Lasix to 80 mg IV twice a day.  Breathing improvement, increased output.  She is net out 2.4 L. Regarding continue with this current strategy  as well as the carvedilol 6.25 mg twice a day. -No Entresto secondary to angioedema  Dr. Haroldine Laws and the advanced heart failure team has contacted me and he will see her.  Appreciate.  Question possible right heart catheterization during this hospitalization or awaiting until outpatient.  Note she is on Eliquis.  Candee Furbish, MD

## 2021-02-03 NOTE — Care Management Important Message (Signed)
Important Message  Patient Details  Name: [name] MRN: [id number] Date of Birth: August 21, 1931   Medicare Important Message Given:  Yes Due to staffing patients room called no answer.  Im mailed to the patient home address.Orbie Pyo 02/03/2021, 2:05 PM

## 2021-02-04 ENCOUNTER — Encounter (HOSPITAL_COMMUNITY)
Admission: EM | Disposition: A | Discharge status: Home / Self Care | Payer: Self-pay | Source: Home / Self Care | Attending: Internal Medicine

## 2021-02-04 ENCOUNTER — Encounter (HOSPITAL_COMMUNITY): Payer: Self-pay | Admitting: Internal Medicine

## 2021-02-04 DIAGNOSIS — E782 Mixed hyperlipidemia: Secondary | ICD-10-CM

## 2021-02-04 DIAGNOSIS — E1169 Type 2 diabetes mellitus with other specified complication: Secondary | ICD-10-CM

## 2021-02-04 DIAGNOSIS — I25119 Atherosclerotic heart disease of native coronary artery with unspecified angina pectoris: Secondary | ICD-10-CM

## 2021-02-04 DIAGNOSIS — Z9989 Dependence on other enabling machines and devices: Secondary | ICD-10-CM

## 2021-02-04 DIAGNOSIS — G4733 Obstructive sleep apnea (adult) (pediatric): Secondary | ICD-10-CM

## 2021-02-04 DIAGNOSIS — I739 Peripheral vascular disease, unspecified: Secondary | ICD-10-CM

## 2021-02-04 DIAGNOSIS — E785 Hyperlipidemia, unspecified: Secondary | ICD-10-CM

## 2021-02-04 HISTORY — PX: RIGHT HEART CATH: CATH118263

## 2021-02-04 LAB — BASIC METABOLIC PANEL
Anion gap: 11 (ref 5–15)
BUN: 16 mg/dL (ref 8–23)
CO2: 35 mmol/L — ABNORMAL HIGH (ref 22–32)
Calcium: 9.6 mg/dL (ref 8.9–10.3)
Chloride: 94 mmol/L — ABNORMAL LOW (ref 98–111)
Creatinine, Ser: 0.79 mg/dL (ref 0.44–1.00)
GFR, Estimated: >60 mL/min (ref >60)
Glucose, Bld: 135 mg/dL — ABNORMAL HIGH (ref 70–99)
Potassium: 4 mmol/L (ref 3.5–5.1)
Sodium: 140 mmol/L (ref 135–145)

## 2021-02-04 LAB — POCT I-STAT EG7
Acid-Base Excess: 12 mmol/L — ABNORMAL HIGH (ref 0.0–2.0)
Bicarbonate: 41.6 mmol/L — ABNORMAL HIGH (ref 20.0–28.0)
Calcium, Ion: 1.08 mmol/L — ABNORMAL LOW (ref 1.15–1.40)
HCT: 47 % — ABNORMAL HIGH (ref 36.0–46.0)
Hemoglobin: 16 g/dL — ABNORMAL HIGH (ref 12.0–15.0)
O2 Saturation: 64 %
Potassium: 3.3 mmol/L — ABNORMAL LOW (ref 3.5–5.1)
Sodium: 144 mmol/L (ref 135–145)
TCO2: 44 mmol/L — ABNORMAL HIGH (ref 22–32)
pCO2, Ven: 76.2 mmHg (ref 44.0–60.0)
pH, Ven: 7.345 (ref 7.250–7.430)
pO2, Ven: 37 mmHg (ref 32.0–45.0)

## 2021-02-04 LAB — CBC
HCT: 50.9 % — ABNORMAL HIGH (ref 36.0–46.0)
Hemoglobin: 15.7 g/dL — ABNORMAL HIGH (ref 12.0–15.0)
MCH: 30.2 pg (ref 26.0–34.0)
MCHC: 30.8 g/dL (ref 30.0–36.0)
MCV: 97.9 fL (ref 80.0–100.0)
Platelets: 160 10*3/uL (ref 150–400)
RBC: 5.2 MIL/uL — ABNORMAL HIGH (ref 3.87–5.11)
RDW: 13.5 % (ref 11.5–15.5)
WBC: 5.8 10*3/uL (ref 4.0–10.5)
nRBC: 0 % (ref 0.0–0.2)

## 2021-02-04 LAB — GLUCOSE, CAPILLARY
Glucose-Capillary: 189 mg/dL — ABNORMAL HIGH (ref 70–99)
Glucose-Capillary: 174 mg/dL — ABNORMAL HIGH (ref 70–99)
Glucose-Capillary: 287 mg/dL — ABNORMAL HIGH (ref 70–99)
Glucose-Capillary: 193 mg/dL — ABNORMAL HIGH (ref 70–99)

## 2021-02-04 SURGERY — RIGHT HEART CATH
Anesthesia: LOCAL

## 2021-02-04 MED ORDER — HEPARIN (PORCINE) IN NACL 1000-0.9 UT/500ML-% IV SOLN
INTRAVENOUS | Status: AC
  Filled 2021-02-04: qty 500

## 2021-02-04 MED ORDER — SODIUM CHLORIDE 0.9 % IV SOLN
INTRAVENOUS | Status: AC | PRN
  Administered 2021-02-04: 10 mL/h via INTRAVENOUS

## 2021-02-04 MED ORDER — SILDENAFIL CITRATE 20 MG PO TABS
20.0000 mg | ORAL_TABLET | Freq: Three times a day (TID) | ORAL | Status: DC
  Administered 2021-02-04 – 2021-02-05 (×3): 20 mg via ORAL
  Filled 2021-02-04 (×6): qty 1

## 2021-02-04 MED ORDER — HEPARIN (PORCINE) IN NACL 1000-0.9 UT/500ML-% IV SOLN
INTRAVENOUS | Status: DC | PRN
  Administered 2021-02-04: 500 mL

## 2021-02-04 MED ORDER — LIDOCAINE HCL (PF) 1 % IJ SOLN
INTRAMUSCULAR | Status: AC
  Filled 2021-02-04: qty 30

## 2021-02-04 MED ORDER — LIDOCAINE HCL (PF) 1 % IJ SOLN
INTRAMUSCULAR | Status: DC | PRN
  Administered 2021-02-04: 2 mL

## 2021-02-04 SURGICAL SUPPLY — 9 items
CATH BALLN WEDGE 5F 110CM (CATHETERS) ×2 IMPLANT
GUIDEWIRE .025 260CM (WIRE) ×2 IMPLANT
KIT MICROPUNCTURE NIT STIFF (SHEATH) ×2 IMPLANT
PACK CARDIAC CATHETERIZATION (CUSTOM PROCEDURE TRAY) ×2 IMPLANT
SHEATH GLIDE SLENDER 4/5FR (SHEATH) ×2 IMPLANT
SHEATH PROBE COVER 6X72 (BAG) ×2 IMPLANT
TRANSDUCER W/STOPCOCK (MISCELLANEOUS) ×2 IMPLANT
TUBING ART PRESS 72  MALE/FEM (TUBING) ×2
TUBING ART PRESS 72 MALE/FEM (TUBING) ×1 IMPLANT

## 2021-02-04 NOTE — TOC Initial Note (Signed)
Transition of Care [facility]) - Initial/Assessment Note    Patient Details  Name: [name] MRN: [id number] Date of Birth: July 01, 1931  Transition of Care [facility]) CM/SW Contact:    Joanne Chars, LCSW Phone Number: 02/04/2021, 3:00 PM  Clinical Narrative:  CSW met with pt regarding return to [facility].  PT/OT recommendations still pending.  Pt reports she lives in independent living apartment, has gone to PT/OT services at [facility] in the past, but not currently involved.  Permission given to speak with daughter Almyra Free.    CSW informed Philippa Sicks at [facility] of pt possible return today.                 Expected Discharge Plan: Assisted Living Barriers to Discharge: No Barriers Identified   Patient Goals and CMS Choice   CMS Medicare.gov Compare Post Acute Care list provided to::  (na)    Expected Discharge Plan and Services Expected Discharge Plan: Assisted Living       Living arrangements for the past 2 months: [place]                                      Prior Living Arrangements/Services Living arrangements for the past 2 months: [place] Lives with:: Facility Resident Patient language and need for interpreter reviewed:: Yes Do you feel safe going back to the place where you live?: Yes      Need for Family Participation in Patient Care: Yes (Comment) Care giver support system in place?: Yes (comment) Current home services: Other (comment) (none) Criminal Activity/Legal Involvement Pertinent to Current Situation/Hospitalization: No - Comment as needed  Activities of Daily Living Home Assistive Devices/Equipment: Cane (specify quad or straight) ADL Screening (condition at time of admission) Patient's cognitive ability adequate to safely complete daily activities?: Yes Is the patient deaf or have difficulty hearing?: No Does the patient have difficulty seeing, even when wearing  glasses/contacts?: No Does the patient have difficulty concentrating, remembering, or making decisions?: No Patient able to express need for assistance with ADLs?: Yes Does the patient have difficulty dressing or bathing?: No Independently performs ADLs?: Yes (appropriate for developmental age) Does the patient have difficulty walking or climbing stairs?: Yes Weakness of Legs: Both Weakness of Arms/Hands: None  Permission Sought/Granted Permission sought to share information with : Family Supports Permission granted to share information with : Yes, Verbal Permission Granted  Share Information with NAME: daughter, Almyra Free           Emotional Assessment Appearance:: Appears stated age Attitude/Demeanor/Rapport: Engaged Affect (typically observed): Appropriate,Pleasant Orientation: : Oriented to Self,Oriented to Place,Oriented to Situation Alcohol / Substance Use: Not Applicable Psych Involvement: No (comment)  Admission diagnosis:  Hypoxia [R09.02] Acute respiratory failure with hypoxia and hypercapnia (HCC) [J96.01, J96.02] Acute on chronic congestive heart failure, unspecified heart failure type ([place]) [I50.9] Patient Active Problem List   Diagnosis Date Noted  . Acute respiratory failure with hypoxia and hypercapnia ([place]) 01/31/2021  . Acute on chronic diastolic CHF (congestive heart failure) ([place]) 01/31/2021  . Elevated troponin 01/31/2021  . Type 2 diabetes mellitus with hyperlipidemia ([place]) 01/31/2021  . Neuropathy 08/19/2020  . PAD (peripheral artery disease) ([place]) 02/29/2020  . Chronic kidney disease (CKD) 02/20/2020  . Osteoarthritis 08/01/2019  . Arthritis of left sacroiliac joint 05/04/2017  . Osteopenia 02/17/2017  . Piriformis syndrome of left side 02/16/2017  . Acquired leg length discrepancy  02/16/2017  . COPD GOLD I  01/27/2017  . Lumbar radiculopathy, acute 01/27/2017  . Granuloma annulare 07/27/2016  . Numbness of fingers 07/27/2016  . Primary osteoarthritis  of right knee 06/27/2016  . Rash and nonspecific skin eruption 06/09/2016  . Diabetes ([place]) 03/07/2016  . OSA  08/28/2014  . Chronic diastolic heart failure ([place]) 07/30/2014  . Nocturnal hypoxemia 07/09/2014  . SOB (shortness of breath) 05/15/2014  . Pulmonary HTN ([place]) 05/15/2014  . Band keratopathy 01/16/2014  . 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 Hyperthyroidism 11/04/2011  . Multinodular goiter 10/14/2011  . Atrophy, Fuchs' 09/29/2011  . ATRIAL FIBRILLATION 06/03/2010  . VITILIGO 11/07/2009  . SINOATRIAL NODE DYSFUNCTION 06/16/2009  . Atrial flutter ([place]) 06/25/2008  . HYPERLIPIDEMIA 01/29/2008  . Coronary atherosclerosis 01/29/2008  . HTN (hypertension) 10/30/2007   PCP:  [name], MD Pharmacy:   [facility] [address], [address] [place], [place] [address] Phone: [phone] Fax: [phone]     Social Determinants of Health (SDOH) Interventions    Readmission Risk Interventions No flowsheet data found.

## 2021-02-04 NOTE — Progress Notes (Signed)
Advanced Heart Failure Rounding Note  PCP-Cardiologist: Pixie Casino, MD   Subjective:    Remains in IV lasix. No weight charted. I/Os incomplete.  Feels better today. Less SOB.  No orthopnea or PND.   RHC today  RA = 6 RV = 88/5 PA =  86/33 (53) PCW = 21 Fick cardiac output/index = 3.4/1.9 PVR = 9.4 WU Ao sat = 97% PA sat = 64%, 64% PAPi = 8.8  Assessment:  1. Severe mixed pulmonary HTN with cor pulmonale 2. Mildly elevated left-sided filling pressures  Plan/Discussion:  Volume status much improved. Will give on more dose IV lasix today and then can probably go home this afternoon or even. Will start sildenafil 20 tid carefully for PAH.     Objective:   Weight Range: 75.9 kg Body mass index is 28.73 kg/m.   Vital Signs:   Temp:  [97.9 F (36.6 C)-98.6 F (37 C)] 98.4 F (36.9 C) (02/09 0802) Pulse Rate:  [68-94] 80 (02/09 0915) Resp:  [16-25] 17 (02/09 0915) BP: (143-191)/(62-80) 178/73 (02/09 0915) SpO2:  [91 %-98 %] 97 % (02/09 0915)    Weight change: Filed Weights   01/31/21 2044 02/02/21 0802  Weight: 75.9 kg 75.9 kg    Intake/Output:   Intake/Output Summary (Last 24 hours) at 02/04/2021 0924 Last data filed at 02/03/2021 1928 Gross per 24 hour  Intake -  Output 1250 ml  Net -1250 ml      Physical Exam    General:  Well appearing. No resp difficulty HEENT: Normal Neck: Supple. JVP 6 . Carotids 2+ bilat; no bruits. No lymphadenopathy or thyromegaly appreciated. Cor: PMI nondisplaced. Regular rate & rhythm. No rubs, gallops or murmurs. Lungs: Clear Abdomen: Soft, nontender, nondistended. No hepatosplenomegaly. No bruits or masses. Good bowel sounds. Extremities: No cyanosis, clubbing, rash, edema Neuro: Alert & orientedx3, cranial nerves grossly intact. moves all 4 extremities w/o difficulty. Affect pleasant   Telemetry   AF 80-90 Personally reviewed   Labs    CBC Recent Labs    02/03/21 0157 02/04/21 0255  WBC 4.2 5.8   HGB 15.7* 15.7*  HCT 51.9* 50.9*  MCV 96.6 97.9  PLT 145* 938   Basic Metabolic Panel Recent Labs    02/03/21 0157 02/04/21 0255  NA 138 140  K 3.9 4.0  CL 94* 94*  CO2 32 35*  GLUCOSE 167* 135*  BUN 20 16  CREATININE 0.84 0.79  CALCIUM 9.2 9.6   Liver Function Tests Recent Labs    02/02/21 0059  AST 23  ALT 15  ALKPHOS 72  BILITOT 1.3*  PROT 6.0*  ALBUMIN 2.8*   No results for input(s): LIPASE, AMYLASE in the last 72 hours. Cardiac Enzymes No results for input(s): CKTOTAL, CKMB, CKMBINDEX, TROPONINI in the last 72 hours.  BNP: BNP (last 3 results) Recent Labs    06/09/20 1130 12/18/20 1208 01/30/21 1037  BNP 225.6* 134.0* 171.8*    ProBNP (last 3 results) No results for input(s): PROBNP in the last 8760 hours.   D-Dimer No results for input(s): DDIMER in the last 72 hours. Hemoglobin A1C No results for input(s): HGBA1C in the last 72 hours. Fasting Lipid Panel No results for input(s): CHOL, HDL, LDLCALC, TRIG, CHOLHDL, LDLDIRECT in the last 72 hours. Thyroid Function Tests No results for input(s): TSH, T4TOTAL, T3FREE, THYROIDAB in the last 72 hours.  Invalid input(s): FREET3  Other results:   Imaging    CARDIAC CATHETERIZATION  Result Date: 02/04/2021 Findings: RA =  6 RV = 88/5 PA =  86/33 (53) PCW = 21 Fick cardiac output/index = 3.4/1.9 PVR = 9.4 WU Ao sat = 97% PA sat = 64%, 64% PAPi = 8.8 Assessment: 1. Severe mixed pulmonary HTN with cor pulmonale 2. Mildly elevated left-sided filling pressures Plan/Discussion: Volume status much improved. Will give on more dose IV lasix today and then can probably go home this afternoon or even. Will start sildenafil 20 tid carefully for PAH. Glori Bickers, MD 9:24 AM     Medications:     Scheduled Medications: . [MAR Hold] apixaban  5 mg Oral BID  . aspirin  81 mg Oral Pre-Cath  . [MAR Hold] carvedilol  6.25 mg Oral BID WC  . [MAR Hold] cholecalciferol  1,000 Units Oral QPC supper  . [MAR  Hold] clopidogrel  75 mg Oral Daily  . [MAR Hold] ezetimibe  10 mg Oral Daily  . [MAR Hold] furosemide  80 mg Intravenous Q12H  . [MAR Hold] insulin aspart  0-5 Units Subcutaneous QHS  . [MAR Hold] insulin aspart  0-9 Units Subcutaneous TID WC  . [MAR Hold] insulin glargine  15 Units Subcutaneous QHS  . [MAR Hold] sodium chloride flush  3 mL Intravenous Q12H  . [MAR Hold] sodium chloride flush  3 mL Intravenous Q12H     Infusions: . [MAR Hold] sodium chloride    . sodium chloride    . sodium chloride       PRN Medications:  [MAR Hold] sodium chloride, sodium chloride, [MAR Hold] acetaminophen, [MAR Hold] ipratropium-albuterol, [MAR Hold] nitroGLYCERIN, [MAR Hold] ondansetron (ZOFRAN) IV, [MAR Hold] sodium chloride flush, sodium chloride flush, [MAR Hold] traZODone   Assessment/Plan   1. PAH 2. Acute on chronic diastolic HF 3. CAD 4. Chronic AF  Volume status much improved with diuresis. Will give on more dose IV lasix today and then can probably go home this afternoon or even. Will start sildenafil 20 tid carefully for PAH. She has f/u with me later this month in Clinic.    Length of Stay: 4  Glori Bickers, MD  02/04/2021, 9:24 AM  Advanced Heart Failure Team Pager [phone] (M-F; 7a - 4p)  Please contact [facility] for night-coverage after hours (4p -7a ) and weekends on [email or web address]

## 2021-02-04 NOTE — Progress Notes (Signed)
PROGRESS NOTE    [name]  JKK:[id number] DOB: Mar 03, 1931 DOA: 01/30/2021 PCP: [name], MD   Brief Narrative:  Patient is an 87 year old African-American female with a past medical history significant for but not limited to chronic combined CHF, CAD with a prior MI in 2008, history of atrial fibrillation a flutter on anticoagulation with Eliquis, history of peripheral arterial disease status post right external iliac artery stenting as well as history of diabetes mellitus type 2, hypertension, hyperlipidemia, COPD, OSA on CPAP as well as other comorbidities who presented with shortness of breath and the inability to lay flat and had worsening lower extremity edema.  She follows with cardiology in outpatient and had a left heart cath in May of last year which showed severe three-vessel obstructive CAD and she underwent status post PCI  of th proximal left circumflex/OM1 with DES x1 with IVUS guidance and PCI of the proximal LAD with orbital atherectomy and DES x1.  She is currently on Plavix 75 mg and Eliquis 5 mg p.o. twice daily.  Over the past few months she has had progressively worsening dyspnea and was unable to lie flat and had an inability to walk a few steps without having to stop.  She been compliant with her diuretics but does not have a scale does not weigh herself.  Work-up in the ED showed concern for interstitial edema and bilateral small pleural effusions and she was hypoxic quiring BiPAP placement.  Cardiology is consulted and she was initiated on aggressive diuresis.  Advanced heart failure team evaluated and she underwent a right heart cath which showed severe pulmonary arterial hypertension.  Cardiology recommending another dose of IV Lasix today and starting sildenafil 20 g 3 times daily for PAH.  PT OT has to further evaluate and treat   Assessment & Plan:   Principal Problem:   Acute respiratory failure with hypoxia and hypercapnia (HCC) Active Problems:    HYPERLIPIDEMIA   HTN (hypertension)   Coronary atherosclerosis   ATRIAL FIBRILLATION   Atrial flutter (HCC)   Hyperthyroidism   Pulmonary HTN (HCC)   Chronic diastolic heart failure (HCC)   OSA    COPD GOLD I    Chronic kidney disease (CKD)   PAD (peripheral artery disease) (HCC)   Acute on chronic diastolic CHF (congestive heart failure) (HCC)   Elevated troponin   Type 2 diabetes mellitus with hyperlipidemia (HCC)   Acute Hypoxic Respiratory Dailure requiring NIPPV with BiPAP due to Acute on Chronic Combined Systolic and Diastolic CHF complicated by [facility] -Patient was admitted to the hospital, hypoxic initially requiring BiPAP.   -A 2D echo done 2/5 showed EF 40-45%, LV has global hypokinesis, LVH and mildly reduced RV function.  The LV function seems to be slightly worse based on the most recent echo in May 2021 -SpO2: 98 % O2 Flow Rate (L/min): 2 L/min -Remains on supplemental oxygen with 2 L.   -Started on IV Lasix, renal function is stable and will continue today. Increased to 80 mg 2/7 by -Cardiology and they are recommending giving another dose of IV Lasix today. Weight has remained stable but weight is essentially the same but not repeated in 3 days; Cardiology feels that her volume status is much improved with Diuresis  -Cardiology took the patient for RHC and showed severe Mixed Pulmonary HTN with Cor Pulmonale  -Cardiology to start Sildenafil 20 mg TID for PAH -There is certainly a degree of high salt intake but it seems to be outside of  patient's control as the foods are being cooked for her in her retirement community -Continue strict ins and outs, daily weights; Patient is -4.150 Liters since admission  -Cardiology following, appreciate input  Generalized Weakness and Deconditioning -In the setting of Above -PT and OT to evaluate and treat  Coronary Artery Disease, prior MI/stenting, PAD -No chest pain, no symptoms to suggest active events.  Troponin is mildly  elevated likely demand -Continue Clopidogrel 75 mg po Daily, Carvedilol 6.25 mg po BID, and NTG 0.4 mg SL q36min pRN  Chronic A. Fib/Flutter -Continue Carvedilol 6.25 mg po BID and Apixaban 5 mg po BID  -C/w Telemetry Monitoring   Acquired Thrombophilia -In the Setting of Paroxsymal Atrial Fib/Flutter -CHA2DS2-VASc of at least 5 -C/w Apixaban 5 mg po BID and continue to Monitor for bleeding   Erthrocytosis -In the setting of Diuresis -Patient's Hgb/Hct is now 15.7/50.9 -Continue to Monitor and Trend and repeat CBC in the AM   COPD -No wheezing, this appears stable, she is on oxygen but probably due to #1 -C/w DuoNeb 3 mL q4hprn Wheezing and SOB   OSA on CPAP -Continue home CPAP  HTN -C/w Carvedilol 6.25 mg po BID  -Continue to Monitor BP per Protocol  -Last BP was 136/80  DM2 -Continue Lantus 15 units sq qHS, Sensitive Novolog Sliding Scale AC/HS , CBGs acceptable and ranging from 161-287 -Continue to Monitor CBG's carefully and adjust Insulin Regimen as Necessary  Hyperlipidemia -Continue home medications with Ezetimibe 10 mg po Daily -Takes Repatha an outpatient due to Statin Intolerance -Continue to Follow up at [facility] at D/C   Overweight -Complicates overall prognosis and care -Estimated body mass index is 28.73 kg/m as calculated from the following:   Height as of this encounter: 5\' 4"  (1.626 m).   Weight as of this encounter: 75.9 kg. -Weight Loss and Dietary Counseling given    DVT prophylaxis: Anticoagulated with Apixaban 5 mg po BID  Code Status: DO NOT RESUSCITATE  Family Communication: No family present at bedside  Disposition Plan: Pending further Cardiac Clearance and Evaluation by PT/OT   Status is: Inpatient  Remains inpatient appropriate because:Unsafe d/c plan, IV treatments appropriate due to intensity of illness or inability to take PO and Inpatient level of care appropriate due to severity of illness    Dispo: The patient is from:  Home              Anticipated d/c is to: TBD              Anticipated d/c date is: 2 days              Patient currently is not medically stable to d/c.   Difficult to place patient No  Consultants:   Cardiology  Advance Heart Failure Team   Procedures:  ECHOCARDIOGRAM IMPRESSIONS    1. Left ventricular ejection fraction, by estimation, is 40 to 45%. The  left ventricle has mildly decreased function. The left ventricle  demonstrates global hypokinesis. There is moderate left ventricular  hypertrophy. Left ventricular diastolic function  could not be evaluated.  2. Right ventricular systolic function is mildly reduced. The right  ventricular size is normal.  3. Left atrial size was moderately dilated.  4. The mitral valve is abnormal. Mild mitral valve regurgitation.  Moderate mitral annular calcification.  5. The aortic valve is tricuspid. Aortic valve regurgitation is not  visualized.   Comparison(s): Changes from prior study are noted. 05/16/20: LVEF 50-55%,  grade 3 DD.  FINDINGS  Left Ventricle: Left ventricular ejection fraction, by estimation, is 40  to 45%. The left ventricle has mildly decreased function. The left  ventricle demonstrates global hypokinesis. The left ventricular internal  cavity size was normal in size. There is  moderate left ventricular hypertrophy. Left ventricular diastolic  function could not be evaluated due to atrial fibrillation. Left  ventricular diastolic function could not be evaluated.   Right Ventricle: The right ventricular size is normal. No increase in  right ventricular wall thickness. Right ventricular systolic function is  mildly reduced.   Left Atrium: Left atrial size was moderately dilated.   Right Atrium: Right atrial size was normal in size.   Pericardium: There is no evidence of pericardial effusion.   Mitral Valve: The mitral valve is abnormal. Moderate mitral annular  calcification. Mild mitral valve  regurgitation, with posteriorly-directed  jet.   Tricuspid Valve: The tricuspid valve is grossly normal. Tricuspid valve  regurgitation is trivial.   Aortic Valve: The aortic valve is tricuspid. Aortic valve regurgitation is  not visualized.   Pulmonic Valve: The pulmonic valve was grossly normal. Pulmonic valve  regurgitation is not visualized.   Aorta: The aortic root and ascending aorta are structurally normal, with  no evidence of dilitation.   IAS/Shunts: No atrial level shunt detected by color flow Doppler.     LEFT VENTRICLE  PLAX 2D  LVIDd:     3.70 cm  LVIDs:     2.90 cm  LV PW:     1.20 cm  LV IVS:    1.30 cm  LVOT diam:   1.90 cm  LV SV:     44  LV SV Index:  25  LVOT Area:   2.84 cm     RIGHT VENTRICLE     IVC  RV Basal diam: 2.90 cm IVC diam: 1.80 cm  TAPSE (M-mode): 1.6 cm   LEFT ATRIUM       Index    RIGHT ATRIUM      Index  LA diam:    4.70 cm 2.65 cm/m RA Area:   18.30 cm  LA Vol (A2C):  77.9 ml 43.90 ml/m RA Volume:  51.90 ml 29.25 ml/m  LA Vol (A4C):  78.5 ml 44.24 ml/m  LA Biplane Vol: 80.4 ml 45.31 ml/m  AORTIC VALVE  LVOT Vmax:  85.80 cm/s  LVOT Vmean: 60.750 cm/s  LVOT VTI:  0.154 m    AORTA  Ao Root diam: 3.20 cm   MITRAL VALVE  MV Area (PHT): 3.71 cm  SHUNTS  MV Decel Time: 205 msec  Systemic VTI: 0.15 m  MV E velocity: 93.00 cm/s Systemic Diam: 1.90 cm   LE VENOUS DUPLEX RIGHT:  - There is no evidence of deep vein thrombosis in the lower extremity.    - No cystic structure found in the popliteal fossa.    LEFT:  - There is no evidence of deep vein thrombosis in the lower extremity.    - No cystic structure found in the popliteal fossa.   RIGHT HEART CARDIAC CATHETERIZATION Findings:  RA = 6 RV = 88/5 PA =  86/33 (53) PCW = 21 Fick cardiac output/index = 3.4/1.9 PVR = 9.4 WU Ao sat = 97% PA sat = 64%, 64% PAPi =  8.8  Assessment:  1. Severe mixed pulmonary HTN with cor pulmonale 2. Mildly elevated left-sided filling pressures  Plan/Discussion:  Volume status much improved. Will give on more dose IV lasix today and then can probably  go home this afternoon or even. Will start sildenafil 20 tid carefully for PAH.    Antimicrobials:  Anti-infectives (From admission, onward)   None        Subjective: Seen and examined at bedside states that her shortness of breath was better but states that she is extremely weak still.  No nausea or vomiting.  Feels okay.  Denies any chest pain, lightheadedness or dizziness.  Objective: Vitals:   02/03/21 2348 02/03/21 2353 02/04/21 0315 02/04/21 0802  BP:  (!) 165/63 (!) 169/71 (!) 161/64  Pulse: 75 78 72   Resp: (!) 21 20 18  (!) 25  Temp:  98.1 F (36.7 C) 97.9 F (36.6 C) 98.4 F (36.9 C)  TempSrc:  Oral Oral Oral  SpO2: 96% 92% 94% 96%  Weight:      Height:        Intake/Output Summary (Last 24 hours) at 02/04/2021 0804 Last data filed at 02/03/2021 1928 Gross per 24 hour  Intake --  Output 1250 ml  Net -1250 ml   Filed Weights   01/31/21 2044 02/02/21 0802  Weight: 75.9 kg 75.9 kg   Examination: Physical Exam:  Constitutional: WN/WD overweight elderly African-American female currently in NAD and appears calm and comfortable Eyes: Lids and conjunctivae normal, sclerae anicteric  ENMT: External Ears, Nose appear normal. Grossly normal hearing.  Neck: Appears normal, supple, no cervical masses, normal ROM, no appreciable thyromegaly; no JVD Respiratory: Diminished to auscultation bilaterally with coarse breath sounds and some mild crackles, no wheezing, rales, rhonchi . Normal respiratory effort and patient is not tachypenic. No accessory muscle use.  Supplemental oxygen via nasal cannula Cardiovascular: Irregularly irregular, no murmurs / rubs / gallops. S1 and S2 auscultated.  Has 1+ lower extremity edema Abdomen: Soft, non-tender,  distended secondary body habitus. Bowel sounds positive.  GU: Deferred. Musculoskeletal: No clubbing / cyanosis of digits/nails. No joint deformity upper and lower extremities.  Skin: No rashes, lesions, ulcers on limited skin evaluation. No induration; Warm and dry.  Neurologic: CN 2-12 grossly intact with no focal deficits. Romberg sign and cerebellar reflexes not assessed.  Psychiatric: Normal judgment and insight. Alert and oriented x 3. Normal mood and appropriate affect.   Data Reviewed: I have personally reviewed following labs and imaging studies  CBC: Recent Labs  Lab 01/30/21 1037 01/31/21 1225 02/01/21 0105 02/02/21 0059 02/03/21 0157 02/04/21 0255  WBC 4.9  --  5.3 5.2 4.2 5.8  HGB 15.7* 18.4* 15.1* 14.1 15.7* 15.7*  HCT 51.5* 54.0* 48.7* 46.1* 51.9* 50.9*  MCV 98.1  --  98.2 98.1 96.6 97.9  PLT 174  --  165 141* 145* 989   Basic Metabolic Panel: Recent Labs  Lab 01/30/21 1037 01/31/21 1225 02/01/21 0105 02/02/21 0059 02/03/21 0157 02/04/21 0255  NA 141 138 140 136 138 140  K 4.6 4.3 4.1 4.2 3.9 4.0  CL 104  --  97* 95* 94* 94*  CO2 29  --  33* 32 32 35*  GLUCOSE 165*  --  149* 138* 167* 135*  BUN 19  --  15 19 20 16   CREATININE 1.01*  --  0.91 0.97 0.84 0.79  CALCIUM 9.8  --  9.0 8.9 9.2 9.6   GFR: Estimated Creatinine Clearance: 49.4 mL/min (by C-G formula based on SCr of 0.79 mg/dL). Liver Function Tests: Recent Labs  Lab 01/31/21 0758 02/01/21 0105 02/02/21 0059  AST 33 23 23  ALT 28 19 15   ALKPHOS 101 76 72  BILITOT 1.0 1.1  1.3*  PROT 7.9 6.4* 6.0*  ALBUMIN 3.7 3.0* 2.8*   No results for input(s): LIPASE, AMYLASE in the last 168 hours. No results for input(s): AMMONIA in the last 168 hours. Coagulation Profile: No results for input(s): INR, PROTIME in the last 168 hours. Cardiac Enzymes: No results for input(s): CKTOTAL, CKMB, CKMBINDEX, TROPONINI in the last 168 hours. BNP (last 3 results) No results for input(s): PROBNP in the last  8760 hours. HbA1C: No results for input(s): HGBA1C in the last 72 hours. CBG: Recent Labs  Lab 02/03/21 0735 02/03/21 1218 02/03/21 1545 02/03/21 2150 02/04/21 0800  GLUCAP 161* 275* 287* 245* 189*   Lipid Profile: No results for input(s): CHOL, HDL, LDLCALC, TRIG, CHOLHDL, LDLDIRECT in the last 72 hours. Thyroid Function Tests: No results for input(s): TSH, T4TOTAL, FREET4, T3FREE, THYROIDAB in the last 72 hours. Anemia Panel: No results for input(s): VITAMINB12, FOLATE, FERRITIN, TIBC, IRON, RETICCTPCT in the last 72 hours. Sepsis Labs: No results for input(s): PROCALCITON, LATICACIDVEN in the last 168 hours.  Recent Results (from the past 240 hour(s))  SARS Coronavirus 2 by RT PCR (hospital order, performed in [facility] hospital lab) Nasopharyngeal Nasopharyngeal Swab     Status: None   Collection Time: 01/31/21  2:49 AM   Specimen: Nasopharyngeal Swab  Result Value Ref Range Status   SARS Coronavirus 2 NEGATIVE NEGATIVE Final    Comment: (NOTE) SARS-CoV-2 target nucleic acids are NOT DETECTED.  The SARS-CoV-2 RNA is generally detectable in upper and lower respiratory specimens during the acute phase of infection. The lowest concentration of SARS-CoV-2 viral copies this assay can detect is 250 copies / mL. A negative result does not preclude SARS-CoV-2 infection and should not be used as the sole basis for treatment or other patient management decisions.  A negative result may occur with improper specimen collection / handling, submission of specimen other than nasopharyngeal swab, presence of viral mutation(s) within the areas targeted by this assay, and inadequate number of viral copies (<250 copies / mL). A negative result must be combined with clinical observations, patient history, and epidemiological information.  Fact Sheet for Patients:   StrictlyIdeas.no  Fact Sheet for Healthcare  Providers: BankingDealers.co.za  This test is not yet approved or  cleared by the [place] FDA and has been authorized for detection and/or diagnosis of SARS-CoV-2 by FDA under an Emergency Use Authorization (EUA).  This EUA will remain in effect (meaning this test can be used) for the duration of the COVID-19 declaration under Section 564(b)(1) of the Act, 21 U.S.C. section 360bbb-3(b)(1), unless the authorization is terminated or revoked sooner.  Performed at [facility], [place] [address], [place], [place] [address]      RN Pressure Injury Documentation:     Estimated body mass index is 28.73 kg/m as calculated from the following:   Height as of this encounter: 5\' 4"  (1.626 m).   Weight as of this encounter: 75.9 kg.  Malnutrition Type:   Malnutrition Characteristics:   Nutrition Interventions:    Radiology Studies: No results found.  Scheduled Meds: . apixaban  5 mg Oral BID  . aspirin  81 mg Oral Pre-Cath  . carvedilol  6.25 mg Oral BID WC  . cholecalciferol  1,000 Units Oral QPC supper  . clopidogrel  75 mg Oral Daily  . ezetimibe  10 mg Oral Daily  . furosemide  80 mg Intravenous Q12H  . insulin aspart  0-5 Units Subcutaneous QHS  . insulin aspart  0-9  Units Subcutaneous TID WC  . insulin glargine  15 Units Subcutaneous QHS  . sodium chloride flush  3 mL Intravenous Q12H  . sodium chloride flush  3 mL Intravenous Q12H   Continuous Infusions: . sodium chloride    . sodium chloride    . sodium chloride      LOS: 4 days   Kerney Elbe, DO [facility] PAGER is on [place]  If 7PM-7AM, please contact night-coverage [email or web address]

## 2021-02-04 NOTE — Evaluation (Signed)
Physical Therapy Evaluation Patient Details Name: [name] MRN: [id number] DOB: 04/30/1931 Today's Date: 02/04/2021   History of Present Illness  87 y.o. female with CAD, HTN, atrial fibrillation/A flutter and chronic prior systolic heart failure, and diastolic heart failure, as well as pulmonary hypertension. Pt Presented to [facility] on 2/4 w/ CC of increased dyspnea and LE edema. Pt underwent R heart cath on 02/04/2021.  Clinical Impression  Pt presents to PT with deficits in activity tolerance, functional mobility, gait, balance, power, and endurance. Pt fatigues quickly, even with some dyspnea noted when conversing at rest. Pt's activity tolerance is limited to short bouts of household mobility at this time. Pt will benefit from aggressive mobilization and continued acute PT services to improve activity tolerance and to reduce falls risk. PT recommends discharge home with [facility]PT services at [facility]. Pt may benefit from increased assistance from staff or family initially due to poor activity tolerance.    Follow Up Recommendations Home health PT;Supervision for mobility/OOB (HHPT services at independent living)    Equipment Recommendations  Other (comment) (would need supplemental O2 if D/C today)    Recommendations for Other Services       Precautions / Restrictions Precautions Precautions: Fall Precaution Comments: monitor SpO2 Restrictions Weight Bearing Restrictions: No      Mobility  Bed Mobility Overal bed mobility: Needs Assistance Bed Mobility: Supine to Sit     Supine to sit: Supervision          Transfers Overall transfer level: Needs assistance Equipment used: Rolling walker (2 wheeled) Transfers: Sit to/from Stand Sit to Stand: Supervision            Ambulation/Gait Ambulation/Gait assistance: Min guard Gait Distance (Feet): 70 Feet Assistive device: Rolling walker (2 wheeled) Gait Pattern/deviations: Step-to pattern Gait velocity:  reduced Gait velocity interpretation: <1.8 ft/sec, indicate of risk for recurrent falls General Gait Details: pt with shortened step-to gait, reduced step length, increased trunk flexion  Stairs            Wheelchair Mobility    Modified Rankin (Stroke Patients Only)       Balance Overall balance assessment: Needs assistance Sitting-balance support: No upper extremity supported;Feet supported Sitting balance-Leahy Scale: Good     Standing balance support: Single extremity supported;During functional activity Standing balance-Leahy Scale: Poor Standing balance comment: reliant on UE support of RW                             Pertinent Vitals/Pain Pain Assessment: No/denies pain    Home Living Family/patient expects to be discharged to:: Assisted living (independent living) Living Arrangements: Children (staff) Available Help at Discharge: Family;Available PRN/intermittently (staff 24/7) Type of Home: Apartment Home Access: Level entry     Home Layout: One level        Prior Function Level of Independence: Independent with assistive device(s)         Comments: pt ambulates with a cane in the community and a rollator at home     Hand Dominance   Dominant Hand: Right    Extremity/Trunk Assessment   Upper Extremity Assessment Upper Extremity Assessment: Overall WFL for tasks assessed    Lower Extremity Assessment Lower Extremity Assessment: Generalized weakness    Cervical / Trunk Assessment Cervical / Trunk Assessment: Kyphotic  Communication   Communication: No difficulties  Cognition Arousal/Alertness: Awake/alert Behavior During Therapy: WFL for tasks assessed/performed Overall Cognitive Status: Impaired/Different from baseline Area of Impairment:  Memory;Safety/judgement;Awareness                     Memory: Decreased short-term memory   Safety/Judgement: Decreased awareness of deficits Awareness: Emergent   General  Comments: pt initially disoriented upon PT arrival but becomes oriented x4 quickly without PT cues      General Comments General comments (skin integrity, edema, etc.): pt on 2L [place] upon arrival, has some dyspnea with conversation. Pt desats to 85% on RA with bed mobility. Pt requires 3L [place] to maintain sats at or above 92% while ambulating at this time. PT is able to wean to 1L [place] at end of session when resting    Exercises     Assessment/Plan    PT Assessment Patient needs continued PT services  PT Problem List Decreased strength;Decreased activity tolerance;Decreased balance;Decreased mobility;Decreased knowledge of precautions;Cardiopulmonary status limiting activity       PT Treatment Interventions DME instruction;Gait training;Functional mobility training;Therapeutic activities;Therapeutic exercise;Balance training;Neuromuscular re-education;Patient/family education    PT Goals (Current goals can be found in the Care Plan section)  Acute Rehab PT Goals Patient Stated Goal: to go home and to improve activity tolerance PT Goal Formulation: With patient Time For Goal Achievement: 02/18/21 Potential to Achieve Goals: Good    Frequency Min 3X/week   Barriers to discharge        Co-evaluation               AM-PAC PT "6 Clicks" Mobility  Outcome Measure Help needed turning from your back to your side while in a flat bed without using bedrails?: A Little Help needed moving from lying on your back to sitting on the side of a flat bed without using bedrails?: A Little Help needed moving to and from a bed to a chair (including a wheelchair)?: A Little Help needed standing up from a chair using your arms (e.g., wheelchair or bedside chair)?: A Little Help needed to walk in hospital room?: A Little Help needed climbing 3-5 steps with a railing? : A Lot 6 Click Score: 17    End of Session Equipment Utilized During Treatment: Oxygen Activity Tolerance: Patient tolerated  treatment well Patient left: in chair;with call bell/phone within reach Nurse Communication: Mobility status PT Visit Diagnosis: Other abnormalities of gait and mobility (R26.89)    Time: 1721-1749 PT Time Calculation (min) (ACUTE ONLY): 28 min   Charges:   PT Evaluation $PT Eval Moderate Complexity: 1 Mod PT Treatments $Gait Training: 8-22 mins        Zenaida Niece, PT, DPT Acute Rehabilitation Pager: [phone]   Zenaida Niece 02/04/2021, 6:01 PM

## 2021-02-04 NOTE — Interval H&P Note (Signed)
History and Physical Interval Note:  02/04/2021 8:53 AM  [name]  has presented today for surgery, with the diagnosis of heart failure.  The various methods of treatment have been discussed with the patient and family. After consideration of risks, benefits and other options for treatment, the patient has consented to  Procedure(s): RIGHT HEART CATH (N/A) as a surgical intervention.  The patient's history has been reviewed, patient examined, no change in status, stable for surgery.  I have reviewed the patient's chart and labs.  Questions were answered to the patient's satisfaction.     Cale Decarolis

## 2021-02-05 ENCOUNTER — Ambulatory Visit: Payer: Medicare Other | Admitting: Endocrinology

## 2021-02-05 ENCOUNTER — Telehealth (HOSPITAL_COMMUNITY): Payer: Self-pay | Admitting: Pharmacy Technician

## 2021-02-05 ENCOUNTER — Inpatient Hospital Stay (HOSPITAL_COMMUNITY): Payer: Medicare Other

## 2021-02-05 DIAGNOSIS — E0590 Thyrotoxicosis, unspecified without thyrotoxic crisis or storm: Secondary | ICD-10-CM

## 2021-02-05 DIAGNOSIS — R778 Other specified abnormalities of plasma proteins: Secondary | ICD-10-CM

## 2021-02-05 LAB — COMPREHENSIVE METABOLIC PANEL
ALT: 18 U/L (ref 0–44)
AST: 20 U/L (ref 15–41)
Albumin: 3 g/dL — ABNORMAL LOW (ref 3.5–5.0)
Alkaline Phosphatase: 78 U/L (ref 38–126)
Anion gap: 10 (ref 5–15)
BUN: 16 mg/dL (ref 8–23)
CO2: 37 mmol/L — ABNORMAL HIGH (ref 22–32)
Calcium: 9.7 mg/dL (ref 8.9–10.3)
Chloride: 92 mmol/L — ABNORMAL LOW (ref 98–111)
Creatinine, Ser: 0.84 mg/dL (ref 0.44–1.00)
GFR, Estimated: >60 mL/min (ref >60)
Glucose, Bld: 183 mg/dL — ABNORMAL HIGH (ref 70–99)
Potassium: 3.7 mmol/L (ref 3.5–5.1)
Sodium: 139 mmol/L (ref 135–145)
Total Bilirubin: 0.9 mg/dL (ref 0.3–1.2)
Total Protein: 6.4 g/dL — ABNORMAL LOW (ref 6.5–8.1)

## 2021-02-05 LAB — CBC WITH DIFFERENTIAL/PLATELET
Abs Immature Granulocytes: 0.01 10*3/uL (ref 0.00–0.07)
Basophils Absolute: 0 10*3/uL (ref 0.0–0.1)
Basophils Relative: 1 %
Eosinophils Absolute: 0.2 10*3/uL (ref 0.0–0.5)
Eosinophils Relative: 4 %
HCT: 46.5 % — ABNORMAL HIGH (ref 36.0–46.0)
Hemoglobin: 14.7 g/dL (ref 12.0–15.0)
Immature Granulocytes: 0 %
Lymphocytes Relative: 19 %
Lymphs Abs: 1 10*3/uL (ref 0.7–4.0)
MCH: 30.2 pg (ref 26.0–34.0)
MCHC: 31.6 g/dL (ref 30.0–36.0)
MCV: 95.7 fL (ref 80.0–100.0)
Monocytes Absolute: 0.9 10*3/uL (ref 0.1–1.0)
Monocytes Relative: 17 %
Neutro Abs: 3 10*3/uL (ref 1.7–7.7)
Neutrophils Relative %: 59 %
Platelets: 156 10*3/uL (ref 150–400)
RBC: 4.86 MIL/uL (ref 3.87–5.11)
RDW: 13.6 % (ref 11.5–15.5)
WBC: 5.1 10*3/uL (ref 4.0–10.5)
nRBC: 0 % (ref 0.0–0.2)

## 2021-02-05 LAB — PHOSPHORUS: Phosphorus: 3.2 mg/dL (ref 2.5–4.6)

## 2021-02-05 LAB — MAGNESIUM: Magnesium: 1.8 mg/dL (ref 1.7–2.4)

## 2021-02-05 LAB — GLUCOSE, CAPILLARY
Glucose-Capillary: 147 mg/dL — ABNORMAL HIGH (ref 70–99)
Glucose-Capillary: 283 mg/dL — ABNORMAL HIGH (ref 70–99)

## 2021-02-05 MED ORDER — SILDENAFIL CITRATE 20 MG PO TABS: 20.0000 mg | ORAL_TABLET | Freq: Three times a day (TID) | ORAL | 0 refills | Status: DC

## 2021-02-05 MED ORDER — INSULIN LISPRO PROT & LISPRO (75-25 MIX) 100 UNIT/ML KWIKPEN: PEN_INJECTOR | SUBCUTANEOUS | 11 refills | Status: DC

## 2021-02-05 MED ORDER — ACETAMINOPHEN 325 MG PO TABS: 650.0000 mg | ORAL_TABLET | Freq: Four times a day (QID) | ORAL | 0 refills | Status: DC | PRN

## 2021-02-05 MED ORDER — MAGNESIUM SULFATE 2 GM/50ML IV SOLN
2.0000 g | Freq: Once | INTRAVENOUS | Status: AC
  Administered 2021-02-05: 2 g via INTRAVENOUS
  Filled 2021-02-05: qty 50

## 2021-02-05 MED ORDER — FUROSEMIDE 80 MG PO TABS: 80.0000 mg | ORAL_TABLET | Freq: Two times a day (BID) | ORAL | Status: DC

## 2021-02-05 NOTE — TOC Progression Note (Addendum)
Transition of Care [facility]) - Progression Note    Patient Details  Name: MATRICE HERRO MRN: [id number] Date of Birth: Feb 09, 1931  Transition of Care [facility]) CM/SW Contact  Joanne Chars, LCSW Phone Number: 02/05/2021, 12:51 PM  Clinical Narrative:   Outpt PT/OT orders faxed to [facility], Philippa Sicks notified of DC today.  CSW spoke with daughter Almyra Free by phone who is here at the hospital and informed her that DC planned for today.  Daughter prefers to transport pt back to facility herself.FL2 faxed to facility.    Expected Discharge Plan: Assisted Living Barriers to Discharge: No Barriers Identified  Expected Discharge Plan and Services Expected Discharge Plan: Assisted Living       Living arrangements for the past 2 months: Assisted Living Facility                                       Social Determinants of Health (SDOH) Interventions    Readmission Risk Interventions No flowsheet data found.

## 2021-02-05 NOTE — Discharge Summary (Signed)
Physician Discharge Summary  [name] TDS:[id number] DOB: 09-25-1931 DOA: 01/30/2021  PCP: [name], MD  Admit date: 01/30/2021 Discharge date: 02/05/2021  Admitted From: [facility] Disposition: Gerda Diss with outpatient PT/OT   Recommendations for Outpatient Follow-up:  1. Follow up with PCP in 1-2 weeks 2. Follow up with Heart Failure [facility] Dr. Haroldine Laws within 1-2 weeks 3. Follow up with Dr. Halford Chessman within 1-2 weeks  4. Repeat CXR in 3-6 weeks  5. Please obtain CMP/CBC, Mag, Phos in one week 6. Please follow up on the following pending results:  Home Health: YES Equipment/Devices: DME O2 3 Liters   Discharge Condition: Stable CODE STATUS: FULL CODE Diet recommendation: Heart Healthy Carb Modified Diet  Brief/Interim Summary: Patient is an 87 year old African-American female with a past medical history significant for but not limited to chronic combined CHF, CAD with a prior MI in 2008, history of atrial fibrillation a flutter on anticoagulation with Eliquis, history of peripheral arterial disease status post right external iliac artery stenting as well as history of diabetes mellitus type 2, hypertension, hyperlipidemia, COPD, OSA on CPAP as well as other comorbidities who presented with shortness of breath and the inability to lay flat and had worsening lower extremity edema.  She follows with cardiology in outpatient and had a left heart cath in May of last year which showed severe three-vessel obstructive CAD and she underwent status post PCI  of th proximal left circumflex/OM1 with DES x1 with IVUS guidance and PCI of the proximal LAD with orbital atherectomy and DES x1.  She is currently on Plavix 75 mg and Eliquis 5 mg p.o. twice daily.  Over the past few months she has had progressively worsening dyspnea and was unable to lie flat and had an inability to walk a few steps without having to stop.  She been compliant with her diuretics but does not have a scale  does not weigh herself.  Work-up in the ED showed concern for interstitial edema and bilateral small pleural effusions and she was hypoxic quiring BiPAP placement.  Cardiology is consulted and she was initiated on aggressive diuresis.  Advanced heart failure team evaluated and she underwent a right heart cath which showed severe pulmonary arterial hypertension.  Cardiology recommending another dose of IV Lasix today and starting sildenafil 20 g 3 times daily for PAH.  PT OT has to further evaluate and treat and recommended Home Health PT. She improved with Diuresis however still required Supplemental O2 via [place] as she desaturated on Ambulatory Home O2 Screen. She will need to see PCP, Cardiology and Pulmonary within 1-2 weeks and understands and agrees with the plan of care. Daughter was updated at bedside and all questions were answered to her and the patient's satisfaction.   Discharge Diagnoses:  Principal Problem:   Acute respiratory failure with hypoxia and hypercapnia (HCC) Active Problems:   HYPERLIPIDEMIA   HTN (hypertension)   Coronary atherosclerosis   ATRIAL FIBRILLATION   Atrial flutter (HCC)   Hyperthyroidism   Pulmonary HTN (HCC)   Chronic diastolic heart failure (HCC)   OSA    COPD GOLD I    Chronic kidney disease (CKD)   PAD (peripheral artery disease) (HCC)   Acute on chronic diastolic CHF (congestive heart failure) (HCC)   Elevated troponin   Type 2 diabetes mellitus with hyperlipidemia (HCC)   Acute Hypoxic Respiratory Dailure requiring NIPPV with BiPAP due to Acute on Chronic Combined Systolic and Diastolic CHF complicated by [facility] -Patient was admitted to  the hospital, hypoxic initially requiring BiPAP.  -A 2D echo done 2/5 showed EF 40-45%, LV has global hypokinesis, LVH and mildly reduced RV function. The LV function seems to be slightly worse based on the most recent echo in May 2021 -SpO2: 98 % O2 Flow Rate (L/min): 2 L/min -Remains on supplemental oxygen with 2  L.  -Started on IV Lasix, renal function is stable and will continue today. Increasedto 80 mg 2/7 by -Cardiology and they are recommending giving another dose of IV Lasix today. Weight has remained stablebut weight is essentially the same but not repeated in 3 days; Cardiology feels that her volume status is much improved with Diuresis; Resumed Home po Lasix  -Cardiology took the patient for RHC and showed severe Mixed Pulmonary HTN with Cor Pulmonale  -Cardiology to start Sildenafil 20 mg TID for PAH -There is certainly a degree of high salt intake but it seems to be outside of patient's control as the foods are being cooked for her in her retirement community -Continue strict ins and outs, daily weights; Patient is -5.300 Liters since admission  -Cardiology following, appreciate input -Ambulatory Home O2 Screen done and she will require Supplemental O2 via [place]  Generalized Weakness and Deconditioning -In the setting of Above -PT and OT to evaluate and treat and recommending outpatient PT/OT   Coronary Artery Disease, prior MI/stenting, PAD -No chest pain, no symptoms to suggest active events. Troponin is mildly elevated likely demand -Continue Clopidogrel 75 mg po Daily, Carvedilol 6.25 mg po BID, and NTG 0.4 mg SL q54min pRN  Chronic A. Fib/Flutter -Continue Carvedilol 6.25 mg po BID and Apixaban 5 mg po BID  -C/w Telemetry Monitoring while hospitalized   Acquired Thrombophilia -In the Setting of Paroxsymal Atrial Fib/Flutter -CHA2DS2-VASc of at least 5 -C/w Apixaban 5 mg po BID and continue to Monitor for bleeding   Erthrocytosis -In the setting of Diuresis -Patient's Hgb/Hct is now 15.7/50.9 -> 14.7/46.5 -Continue to Monitor and Trend and repeat CBC within 1 week   COPD -No wheezing, this appears stable, she is on oxygen but probably due to #1 -C/w DuoNeb 3 mL q4hprn Wheezing and SOB  -Will Discharge with Supplemental O2 via [place]  OSA on CPAP -Continue home  CPAP  HTN -C/w Carvedilol 6.25 mg po BID and resume Amlodipine 10 mg po Daily  -Continue to Monitor BP per Protocol  -Last BP was 154/58  DM2 -Continue Lantus 15 units sq qHS, Sensitive Novolog Sliding Scale AC/HS while hospitalized , CBGs acceptable and ranging from 147-283 -Will reduced Home Insulin Humalog 75/25 to 18 Units BID  -Continue to Monitor CBG's carefully and adjust Insulin Regimen as Necessary  Hyperlipidemia -Continue home medications with Ezetimibe 10 mg po Daily -Takes Repatha an outpatient due to Statin Intolerance however has not been taking it due to Medication Affordability  -Continue to Follow up at [facility] at D/C to discuss options  Overweight -Complicates overall prognosis and care -Estimated body mass index is 28.73 kg/m as calculated from the following:   Height as of this encounter: 5\' 4"  (1.626 m).   Weight as of this encounter: 75.9 kg. -Weight Loss and Dietary Counseling given   Discharge Instructions  Discharge Instructions    (HEART FAILURE PATIENTS) Call MD:  Anytime you have any of the following symptoms: 1) 3 pound weight gain in 24 hours or 5 pounds in 1 week 2) shortness of breath, with or without a dry hacking cough 3) swelling in the hands, feet or stomach  4) if you have to sleep on extra pillows at night in order to breathe.   Complete by: As directed    Ambulatory referral to Occupational Therapy   Complete by: As directed    Ambulatory referral to Physical Therapy   Complete by: As directed    Call MD for:  difficulty breathing, headache or visual disturbances   Complete by: As directed    Call MD for:  extreme fatigue   Complete by: As directed    Call MD for:  hives   Complete by: As directed    Call MD for:  persistant dizziness or light-headedness   Complete by: As directed    Call MD for:  persistant nausea and vomiting   Complete by: As directed    Call MD for:  redness, tenderness, or signs of infection (pain,  swelling, redness, odor or green/yellow discharge around incision site)   Complete by: As directed    Call MD for:  severe uncontrolled pain   Complete by: As directed    Call MD for:  temperature >100.4   Complete by: As directed    Diet - low sodium heart healthy   Complete by: As directed    Discharge instructions   Complete by: As directed    You were cared for by a hospitalist during your hospital stay. If you have any questions about your discharge medications or the care you received while you were in the hospital after you are discharged, you can call the unit and ask to speak with the hospitalist on call if the hospitalist that took care of you is not available. Once you are discharged, your primary care physician will handle any further medical issues. Please note that NO REFILLS for any discharge medications will be authorized once you are discharged, as it is imperative that you return to your primary care physician (or establish a relationship with a primary care physician if you do not have one) for your aftercare needs so that they can reassess your need for medications and monitor your lab values.  Follow up with PCP and Cardiology within 1-2 weeks. Take all medications as prescribed. If symptoms change or worsen please return to the ED for evaluation   Increase activity slowly   Complete by: As directed      Allergies as of 02/05/2021      Reactions   Levaquin [levofloxacin] Shortness Of Breath, Swelling   angioedema   Lobster [shellfish Allergy] Other (See Comments)   angioedema   Penicillins Rash   Has patient had a PCN reaction causing immediate rash, facial/tongue/throat swelling, SOB or lightheadedness with hypotension: Yes Has patient had a PCN reaction causing severe rash involving mucus membranes or skin necrosis: No Has patient had a PCN reaction that required hospitalization: No Has patient had a PCN reaction occurring within the last 10 years: No If all of the  above answers are "NO", then may proceed with Cephalosporin use.   Valsartan Other (See Comments)   REACTION: angioedema   Codeine Other (See Comments)   Mental status changes   Lipitor [atorvastatin] Other (See Comments)   weakness   Oxycodone Other (See Comments)   hallucinations   Statins    Made her too weak   Tramadol    Amlodipine Besylate Other (See Comments)   REACTION: tingling in lips & gum edema 7/12: talked to patient, states she is tolerating well   Crestor [rosuvastatin Calcium] Rash   Tramadol Hcl Nausea And Vomiting  Medication List    STOP taking these medications   Jardiance 10 MG Tabs tablet Generic drug: empagliflozin   Repatha SureClick 983 MG/ML Soaj Generic drug: Evolocumab     TAKE these medications   acetaminophen 325 MG tablet Commonly known as: TYLENOL Take 2 tablets (650 mg total) by mouth every 6 (six) hours as needed for mild pain or headache. What changed:   medication strength  how much to take  reasons to take this   amLODipine 10 MG tablet Commonly known as: NORVASC Take 1 tablet (10 mg total) by mouth daily.   CALCIUM-MAG-VIT C-VIT D PO Take 1 tablet by mouth daily.   carvedilol 6.25 MG tablet Commonly known as: COREG TAKE 1 TABLET BY MOUTH  TWICE DAILY WITH MEALS   cholecalciferol 1000 units tablet Commonly known as: VITAMIN D Take 1,000 Units by mouth daily after supper.   clopidogrel 75 MG tablet Commonly known as: PLAVIX TAKE 1 TABLET BY MOUTH ONCE DAILY WITH BREAKFAST What changed: See the new instructions.   Eliquis 5 MG Tabs tablet Generic drug: apixaban TAKE 1 TABLET BY MOUTH  TWICE DAILY What changed: how much to take   ezetimibe 10 MG tablet Commonly known as: ZETIA Take 1 tablet (10 mg total) by mouth daily.   furosemide 80 MG tablet Commonly known as: LASIX TAKE 1 TABLET BY MOUTH  TWICE DAILY What changed: when to take this   Insulin Lispro Prot & Lispro (75-25) 100 UNIT/ML Kwikpen Commonly  known as: HumaLOG Mix 75/25 KwikPen Inject 18 units into the skin daily with breakfast and dinner; Have Endocrinologist adjust accordingly What changed: additional instructions   multivitamin with minerals Tabs tablet Take 1 tablet by mouth daily. Centrum Silver   nitroGLYCERIN 0.4 MG SL tablet Commonly known as: NITROSTAT Place 1 tablet (0.4 mg total) under the tongue every 5 (five) minutes as needed for chest pain. Reported on 03/03/2504   OneTouch Delica Plus [id number] Misc USE TO MONITOR GLUCOSE  LEVELS TWICE DAILY   OneTouch Verio test strip Generic drug: glucose blood USE TO MONITOR GLUCOSE  LEVELS TWICE DAILY   potassium chloride SA 20 MEQ tablet Commonly known as: KLOR-CON Take 20 mEq by mouth 2 (two) times daily.   sildenafil 20 MG tablet Commonly known as: REVATIO Take 1 tablet (20 mg total) by mouth 3 (three) times daily.            Durable Medical Equipment  (From admission, onward)         Start     Ordered   02/05/21 1343  For home use only DME oxygen  Once       Comments: DME Portable Concentrator for Portability  Question Answer Comment  Length of Need 6 Months   Mode or (Route) Nasal cannula   Liters per Minute 3   Frequency Continuous (stationary and portable oxygen unit needed)   Oxygen delivery system Gas      02/05/21 1343          Follow-up Information    Bensimhon, Shaune Pascal, MD Follow up on 02/19/2021.   Specialty: Cardiology Why: 10:20 AM [facility] [place] Code 1212  Contact information: [address] [place] [address] [phone]        [name], MD. Call.   Specialty: Internal Medicine Contact information: [place] [address] [phone]        Pixie Casino, MD .   Specialty:  Cardiology Contact information: [address] [place] [address] [phone]        Chesley Mires, MD. Call.   Specialty: Pulmonary  Disease Why: Follow up within 1-2 weeks Contact information: [address] [place] [address] [phone]              Allergies  Allergen Reactions  . Levaquin [Levofloxacin] Shortness Of Breath and Swelling    angioedema  . Lobster [Shellfish Allergy] Other (See Comments)    angioedema  . Penicillins Rash    Has patient had a PCN reaction causing immediate rash, facial/tongue/throat swelling, SOB or lightheadedness with hypotension: Yes Has patient had a PCN reaction causing severe rash involving mucus membranes or skin necrosis: No Has patient had a PCN reaction that required hospitalization: No Has patient had a PCN reaction occurring within the last 10 years: No If all of the above answers are "NO", then may proceed with Cephalosporin use.  . Valsartan Other (See Comments)    REACTION: angioedema  . Codeine Other (See Comments)    Mental status changes  . Lipitor [Atorvastatin] Other (See Comments)    weakness  . Oxycodone Other (See Comments)    hallucinations  . Statins     Made her too weak  . Tramadol   . Amlodipine Besylate Other (See Comments)    REACTION: tingling in lips & gum edema 7/12: talked to patient, states she is tolerating well  . Crestor [Rosuvastatin Calcium] Rash  . Tramadol Hcl Nausea And Vomiting   Consultations:  Cardiology Medical And Advanced Heart Failure Team  Procedures/Studies: DG Chest 2 View  Result Date: 01/30/2021 CLINICAL DATA:  Shortness of breath. Bilateral ankle swelling. History of CHF. EXAM: CHEST - 2 VIEW COMPARISON:  05/15/2020 FINDINGS: The cardiac silhouette is borderline enlarged. Aortic atherosclerosis is noted. The interstitial markings are mildly increased diffusely compared to the prior study with a few Kerley lines. There are new small bilateral pleural effusions. No pneumothorax is identified. No acute osseous abnormality is seen. IMPRESSION: New mild interstitial edema and small bilateral pleural  effusions. Electronically Signed   By: Logan Bores M.D.   On: 01/30/2021 11:07   CT Angio Chest PE W/Cm &/Or Wo Cm  Result Date: 01/31/2021 CLINICAL DATA:  Positive D-dimer. EXAM: CT ANGIOGRAPHY CHEST WITH CONTRAST TECHNIQUE: Multidetector CT imaging of the chest was performed using the standard protocol during bolus administration of intravenous contrast. Multiplanar CT image reconstructions and MIPs were obtained to evaluate the vascular anatomy. CONTRAST:  57mL OMNIPAQUE IOHEXOL 350 MG/ML SOLN COMPARISON:  May 25, 2017. FINDINGS: Cardiovascular: Satisfactory opacification of the pulmonary arteries to the segmental level. No evidence of pulmonary embolism. Mild cardiomegaly is noted. No pericardial effusion. Atherosclerosis of thoracic aorta is noted without aneurysm formation. Coronary artery calcifications are noted with probable stent seen in the left anterior descending artery. Mediastinum/Nodes: Small sliding-type hiatal hernia is noted. Thyroid gland is unremarkable. 1.4 cm subcarinal adenopathy is noted. 13 mm right hilar adenopathy is noted. 10 mm aortopulmonary window adenopathy is noted. This may be inflammatory in etiology, but neoplasm cannot be excluded. Lungs/Pleura: No pneumothorax or pleural effusion is noted. Emphysematous disease is noted in the upper lobes bilaterally. Mild bilateral posterior basilar subsegmental atelectasis is noted. Upper Abdomen: No acute abnormality. Musculoskeletal: No chest wall abnormality. No acute or significant osseous findings. Review of the MIP images confirms the above findings. IMPRESSION: 1. No definite evidence of pulmonary embolus. 2. Coronary artery calcifications are noted with  probable stent seen in the left anterior descending artery. 3. Small sliding-type hiatal hernia. 4. Mediastinal adenopathy is noted which may be inflammatory in etiology, but neoplasm cannot be excluded. Follow-up CT scan in 3 months is recommended to ensure stability or  resolution. 5. Emphysema and aortic atherosclerosis. Aortic Atherosclerosis (ICD10-I70.0) and Emphysema (ICD10-J43.9). Electronically Signed   By: Marijo Conception M.D.   On: 01/31/2021 11:49   CARDIAC CATHETERIZATION  Result Date: 02/04/2021 Findings: RA = 6 RV = 88/5 PA =  86/33 (53) PCW = 21 Fick cardiac output/index = 3.4/1.9 PVR = 9.4 WU Ao sat = 97% PA sat = 64%, 64% PAPi = 8.8 Assessment: 1. Severe mixed pulmonary HTN with cor pulmonale 2. Mildly elevated left-sided filling pressures Plan/Discussion: Volume status much improved. Will give on more dose IV lasix today and then can probably go home this afternoon or even. Will start sildenafil 20 tid carefully for PAH. Glori Bickers, MD 9:24 AM  DG CHEST PORT 1 VIEW  Result Date: 02/05/2021 CLINICAL DATA:  Shortness of breath EXAM: PORTABLE CHEST 1 VIEW COMPARISON:  01/30/2021 FINDINGS: Cardiac shadow is at the upper limits of normal in size. Aortic calcifications are again seen. Lungs are well aerated bilaterally. No focal infiltrate or sizable effusion is noted. No bony abnormality is seen. IMPRESSION: No active disease. Electronically Signed   By: Inez Catalina M.D.   On: 02/05/2021 08:05   ECHOCARDIOGRAM COMPLETE  Result Date: 01/31/2021    ECHOCARDIOGRAM REPORT   Patient Name:   ANNETH BRUNELL Date of Exam: 01/31/2021 Medical Rec #:  [id number]         Height:       63.0 in Accession #:    [id number]        Weight:       163.4 lb Date of Birth:  1931/06/12        BSA:          1.774 m Patient Age:    6 years          BP:           169/49 mmHg Patient Gender: F                 HR:           53 bpm. Exam Location:  Inpatient Procedure: 2D Echo, Cardiac Doppler and Color Doppler Indications:    I50.23 Acute on chronic systolic (congestive) heart failure  History:        Patient has prior history of Echocardiogram examinations, most                 recent 05/16/2020. Previous Myocardial Infarction, CAD and                 Angina, COPD,  Arrythmias:Atrial Fibrillation,                 Signs/Symptoms:Murmur; Risk Factors:Hypertension, Diabetes,                 Dyslipidemia and Sleep Apnea.  Sonographer:    Tiffany Dance Referring Phys: [id number] NA LI IMPRESSIONS  1. Left ventricular ejection fraction, by estimation, is 40 to 45%. The left ventricle has mildly decreased function. The left ventricle demonstrates global hypokinesis. There is moderate left ventricular hypertrophy. Left ventricular diastolic function  could not be evaluated.  2. Right ventricular systolic function is mildly reduced. The right ventricular size is normal.  3. Left atrial size was moderately dilated.  4.  The mitral valve is abnormal. Mild mitral valve regurgitation. Moderate mitral annular calcification.  5. The aortic valve is tricuspid. Aortic valve regurgitation is not visualized. Comparison(s): Changes from prior study are noted. 05/16/20: LVEF 50-55%, grade 3 DD. FINDINGS  Left Ventricle: Left ventricular ejection fraction, by estimation, is 40 to 45%. The left ventricle has mildly decreased function. The left ventricle demonstrates global hypokinesis. The left ventricular internal cavity size was normal in size. There is  moderate left ventricular hypertrophy. Left ventricular diastolic function could not be evaluated due to atrial fibrillation. Left ventricular diastolic function could not be evaluated. Right Ventricle: The right ventricular size is normal. No increase in right ventricular wall thickness. Right ventricular systolic function is mildly reduced. Left Atrium: Left atrial size was moderately dilated. Right Atrium: Right atrial size was normal in size. Pericardium: There is no evidence of pericardial effusion. Mitral Valve: The mitral valve is abnormal. Moderate mitral annular calcification. Mild mitral valve regurgitation, with posteriorly-directed jet. Tricuspid Valve: The tricuspid valve is grossly normal. Tricuspid valve regurgitation is trivial. Aortic  Valve: The aortic valve is tricuspid. Aortic valve regurgitation is not visualized. Pulmonic Valve: The pulmonic valve was grossly normal. Pulmonic valve regurgitation is not visualized. Aorta: The aortic root and ascending aorta are structurally normal, with no evidence of dilitation. IAS/Shunts: No atrial level shunt detected by color flow Doppler.  LEFT VENTRICLE PLAX 2D LVIDd:         3.70 cm LVIDs:         2.90 cm LV PW:         1.20 cm LV IVS:        1.30 cm LVOT diam:     1.90 cm LV SV:         44 LV SV Index:   25 LVOT Area:     2.84 cm  RIGHT VENTRICLE          IVC RV Basal diam:  2.90 cm  IVC diam: 1.80 cm TAPSE (M-mode): 1.6 cm LEFT ATRIUM             Index       RIGHT ATRIUM           Index LA diam:        4.70 cm 2.65 cm/m  RA Area:     18.30 cm LA Vol (A2C):   77.9 ml 43.90 ml/m RA Volume:   51.90 ml  29.25 ml/m LA Vol (A4C):   78.5 ml 44.24 ml/m LA Biplane Vol: 80.4 ml 45.31 ml/m  AORTIC VALVE LVOT Vmax:   85.80 cm/s LVOT Vmean:  60.750 cm/s LVOT VTI:    0.154 m  AORTA Ao Root diam: 3.20 cm MITRAL VALVE MV Area (PHT): 3.71 cm    SHUNTS MV Decel Time: 205 msec    Systemic VTI:  0.15 m MV E velocity: 93.00 cm/s  Systemic Diam: 1.90 cm Lyman Bishop MD Electronically signed by Lyman Bishop MD Signature Date/Time: 01/31/2021/4:42:34 PM    Final    VAS [place] LOWER EXTREMITY VENOUS (DVT)  Result Date: 02/01/2021  Lower Venous DVT Study Indications: Edema, and SOB.  Risk Factors: Extensive history of peripheral arterial disease. Anticoagulation: Eliquis. Comparison Study: Performing Technologist: Darlin Coco RDMS  Examination Guidelines: A complete evaluation includes B-mode imaging, spectral Doppler, color Doppler, and power Doppler as needed of all accessible portions of each vessel. Bilateral testing is considered an integral part of a complete examination. Limited examinations for reoccurring indications may be performed as noted.  The reflux portion of the exam is performed with the patient in  reverse Trendelenburg.  +---------+---------------+---------+-----------+----------+--------------+ RIGHT    CompressibilityPhasicitySpontaneityPropertiesThrombus Aging +---------+---------------+---------+-----------+----------+--------------+ CFV      Full           Yes      Yes                                 +---------+---------------+---------+-----------+----------+--------------+ SFJ      Full                                                        +---------+---------------+---------+-----------+----------+--------------+ FV Prox  Full                                                        +---------+---------------+---------+-----------+----------+--------------+ FV Mid   Full                                                        +---------+---------------+---------+-----------+----------+--------------+ FV DistalFull                                                        +---------+---------------+---------+-----------+----------+--------------+ PFV      Full                                                        +---------+---------------+---------+-----------+----------+--------------+ POP      Full           Yes      Yes                                 +---------+---------------+---------+-----------+----------+--------------+ PTV      Full                                                        +---------+---------------+---------+-----------+----------+--------------+ PERO     Full                                                        +---------+---------------+---------+-----------+----------+--------------+   +---------+---------------+---------+-----------+----------+--------------+ LEFT     CompressibilityPhasicitySpontaneityPropertiesThrombus Aging +---------+---------------+---------+-----------+----------+--------------+ CFV      Full           Yes  Yes                                  +---------+---------------+---------+-----------+----------+--------------+ SFJ      Full                                                        +---------+---------------+---------+-----------+----------+--------------+ FV Prox  Full                                                        +---------+---------------+---------+-----------+----------+--------------+ FV Mid   Full                                                        +---------+---------------+---------+-----------+----------+--------------+ FV DistalFull                                                        +---------+---------------+---------+-----------+----------+--------------+ PFV      Full                                                        +---------+---------------+---------+-----------+----------+--------------+ POP      Full           Yes      Yes                                 +---------+---------------+---------+-----------+----------+--------------+ PTV      Full                                                        +---------+---------------+---------+-----------+----------+--------------+ PERO     Full                                                        +---------+---------------+---------+-----------+----------+--------------+     Summary: RIGHT: - There is no evidence of deep vein thrombosis in the lower extremity.  - No cystic structure found in the popliteal fossa.  LEFT: - There is no evidence of deep vein thrombosis in the lower extremity.  - No cystic structure found in the popliteal fossa.  *See table(s) above for measurements and observations. Electronically signed by Monica Martinez MD on 02/01/2021 at 7:54:58 PM.    Final  Subjective: Seen and examined at bedside and claims feeling well.  Denied any chest pain shortness of breath or lightheadedness or dizziness.  Still requires oxygen at discharge.  No other concerns or complaints at this time is stable to  be discharged all her questions answered and family updated.  She will need to follow-up with PCP, pulmonology as well as cardiology in outpatient setting and she understands agrees with the plan of care.   Discharge Exam: Vitals:   02/05/21 0407 02/05/21 0832  BP: 125/81 (!) 154/58  Pulse: (!) 59 81  Resp: 20   Temp: 98 F (36.7 C)   SpO2: 91% 94%   Vitals:   02/05/21 0034 02/05/21 0040 02/05/21 0407 02/05/21 0832  BP: (!) 141/69  125/81 (!) 154/58  Pulse: 63 62 (!) 59 81  Resp: (!) 24 (!) 28 20   Temp: 98.1 F (36.7 C)  98 F (36.7 C)   TempSrc: Oral  Oral   SpO2: 96% 95% 91% 94%  Weight:      Height:       General: Pt is alert, awake, not in acute distress Cardiovascular: RRR, S1/S2 +, no rubs, no gallops Respiratory: Diminished bilaterally with coarse breath sounds, no wheezing, no rhonchi; wearing supplemental oxygen via nasal cannula Abdominal: Soft, NT, mildly distended, bowel sounds + Extremities: Trace to 1+ edema, no cyanosis  The results of significant diagnostics from this hospitalization (including imaging, microbiology, ancillary and laboratory) are listed below for reference.    Microbiology: Recent Results (from the past 240 hour(s))  SARS Coronavirus 2 by RT PCR (hospital order, performed in [facility] hospital lab) Nasopharyngeal Nasopharyngeal Swab     Status: None   Collection Time: 01/31/21  2:49 AM   Specimen: Nasopharyngeal Swab  Result Value Ref Range Status   SARS Coronavirus 2 NEGATIVE NEGATIVE Final    Comment: (NOTE) SARS-CoV-2 target nucleic acids are NOT DETECTED.  The SARS-CoV-2 RNA is generally detectable in upper and lower respiratory specimens during the acute phase of infection. The lowest concentration of SARS-CoV-2 viral copies this assay can detect is 250 copies / mL. A negative result does not preclude SARS-CoV-2 infection and should not be used as the sole basis for treatment or other patient management decisions.  A negative  result may occur with improper specimen collection / handling, submission of specimen other than nasopharyngeal swab, presence of viral mutation(s) within the areas targeted by this assay, and inadequate number of viral copies (<250 copies / mL). A negative result must be combined with clinical observations, patient history, and epidemiological information.  Fact Sheet for Patients:   StrictlyIdeas.no  Fact Sheet for Healthcare Providers: BankingDealers.co.za  This test is not yet approved or  cleared by the [place] FDA and has been authorized for detection and/or diagnosis of SARS-CoV-2 by FDA under an Emergency Use Authorization (EUA).  This EUA will remain in effect (meaning this test can be used) for the duration of the COVID-19 declaration under Section 564(b)(1) of the Act, 21 U.S.C. section 360bbb-3(b)(1), unless the authorization is terminated or revoked sooner.  Performed at [facility], [place] [address], [place], [place] [address]     Labs: BNP (last 3 results) Recent Labs    06/09/20 1130 12/18/20 1208 01/30/21 1037  BNP 225.6* 134.0* 967.5*   Basic Metabolic Panel: Recent Labs  Lab 02/01/21 0105 02/02/21 0059 02/03/21 0157 02/04/21 0255 02/04/21 0913 02/05/21 0215  NA 140 136 138 140 144 139  K 4.1 4.2  3.9 4.0 3.3* 3.7  CL 97* 95* 94* 94*  --  92*  CO2 33* 32 32 35*  --  37*  GLUCOSE 149* 138* 167* 135*  --  183*  BUN 15 19 20 16   --  16  CREATININE 0.91 0.97 0.84 0.79  --  0.84  CALCIUM 9.0 8.9 9.2 9.6  --  9.7  MG  --   --   --   --   --  1.8  PHOS  --   --   --   --   --  3.2   Liver Function Tests: Recent Labs  Lab 01/31/21 0758 02/01/21 0105 02/02/21 0059 02/05/21 0215  AST 33 23 23 20   ALT 28 19 15 18   ALKPHOS 101 76 72 78  BILITOT 1.0 1.1 1.3* 0.9  PROT 7.9 6.4* 6.0* 6.4*  ALBUMIN 3.7 3.0* 2.8* 3.0*   No results for input(s): LIPASE, AMYLASE in the last 168 hours. No  results for input(s): AMMONIA in the last 168 hours. CBC: Recent Labs  Lab 02/01/21 0105 02/02/21 0059 02/03/21 0157 02/04/21 0255 02/04/21 0913 02/05/21 0215  WBC 5.3 5.2 4.2 5.8  --  5.1  NEUTROABS  --   --   --   --   --  3.0  HGB 15.1* 14.1 15.7* 15.7* 16.0* 14.7  HCT 48.7* 46.1* 51.9* 50.9* 47.0* 46.5*  MCV 98.2 98.1 96.6 97.9  --  95.7  PLT 165 141* 145* 160  --  156   Cardiac Enzymes: No results for input(s): CKTOTAL, CKMB, CKMBINDEX, TROPONINI in the last 168 hours. BNP: Invalid input(s): POCBNP CBG: Recent Labs  Lab 02/04/21 1227 02/04/21 1632 02/04/21 2124 02/05/21 0755 02/05/21 1144  GLUCAP 174* 287* 193* 147* 283*   D-Dimer No results for input(s): DDIMER in the last 72 hours. Hgb A1c No results for input(s): HGBA1C in the last 72 hours. Lipid Profile No results for input(s): CHOL, HDL, LDLCALC, TRIG, CHOLHDL, LDLDIRECT in the last 72 hours. Thyroid function studies No results for input(s): TSH, T4TOTAL, T3FREE, THYROIDAB in the last 72 hours.  Invalid input(s): FREET3 Anemia work up No results for input(s): VITAMINB12, FOLATE, FERRITIN, TIBC, IRON, RETICCTPCT in the last 72 hours. Urinalysis    Component Value Date/Time   COLORURINE YELLOW 08/29/2016 1632   APPEARANCEUR CLOUDY (A) 08/29/2016 1632   LABSPEC 1.027 08/29/2016 1632   PHURINE 5.5 08/29/2016 1632   GLUCOSEU 100 (A) 08/29/2016 1632   GLUCOSEU 100 (A) 08/19/2015 1440   HGBUR LARGE (A) 08/29/2016 1632   BILIRUBINUR NEGATIVE 08/29/2016 1632   BILIRUBINUR negative 08/26/2016 1137   KETONESUR NEGATIVE 08/29/2016 1632   PROTEINUR 100 (A) 08/29/2016 1632   UROBILINOGEN 0.2 08/26/2016 1137   UROBILINOGEN 0.2 08/19/2015 1440   NITRITE POSITIVE (A) 08/29/2016 1632   LEUKOCYTESUR MODERATE (A) 08/29/2016 1632   Sepsis Labs Invalid input(s): PROCALCITONIN,  WBC,  LACTICIDVEN Microbiology Recent Results (from the past 240 hour(s))  SARS Coronavirus 2 by RT PCR (hospital order, performed in  [facility] hospital lab) Nasopharyngeal Nasopharyngeal Swab     Status: None   Collection Time: 01/31/21  2:49 AM   Specimen: Nasopharyngeal Swab  Result Value Ref Range Status   SARS Coronavirus 2 NEGATIVE NEGATIVE Final    Comment: (NOTE) SARS-CoV-2 target nucleic acids are NOT DETECTED.  The SARS-CoV-2 RNA is generally detectable in upper and lower respiratory specimens during the acute phase of infection. The lowest concentration of SARS-CoV-2 viral copies this assay can detect is 250  copies / mL. A negative result does not preclude SARS-CoV-2 infection and should not be used as the sole basis for treatment or other patient management decisions.  A negative result may occur with improper specimen collection / handling, submission of specimen other than nasopharyngeal swab, presence of viral mutation(s) within the areas targeted by this assay, and inadequate number of viral copies (<250 copies / mL). A negative result must be combined with clinical observations, patient history, and epidemiological information.  Fact Sheet for Patients:   StrictlyIdeas.no  Fact Sheet for Healthcare Providers: BankingDealers.co.za  This test is not yet approved or  cleared by the [place] FDA and has been authorized for detection and/or diagnosis of SARS-CoV-2 by FDA under an Emergency Use Authorization (EUA).  This EUA will remain in effect (meaning this test can be used) for the duration of the COVID-19 declaration under Section 564(b)(1) of the Act, 21 U.S.C. section 360bbb-3(b)(1), unless the authorization is terminated or revoked sooner.  Performed at [facility], [place] [address] [place], [place] [address]    Time coordinating discharge: 35 minutes  SIGNED:  Kerney Elbe, DO [facility] 02/05/2021, 5:25 PM Pager is on [place]  If 7PM-7AM, please contact night-coverage [email or web address]

## 2021-02-05 NOTE — TOC Transition Note (Signed)
Transition of Care [facility]) - CM/SW Discharge Note   Patient Details  Name: [name] MRN: [id number] Date of Birth: February 01, 1931  Transition of [facility]) CM/SW Contact:  [name] Chars, LCSW Phone Number: 02/05/2021, 3:13 PM   Clinical Narrative:  Pt discharging back to [facility].  PT/OT will be provided by that facility.  O2 provided by [employer], request made for evaluation for POC.  Daughter to transport.  Philippa Sicks at facility advised that pt discharging shortly.        Barriers to Discharge: No Barriers Identified   Patient Goals and CMS Choice   CMS Medicare.gov Compare Post Acute Care list provided to::  (na)    Discharge Placement                Patient to be transferred to facility by: daughter Almyra Free Name of family member notified: daughter Almyra Free Patient and family notified of of transfer: 02/05/21  Discharge Plan and Services                DME Arranged: Oxygen DME Agency: [employer] (pt interested in portable O2, [employer] informed) Date DME Agency Contacted: 02/05/21 Time DME Agency Contacted: [id number] Representative spoke with at DME Agency: [place]: NA [place] Agency: NA        Social Determinants of Health ([place]) Interventions     Readmission Risk Interventions No flowsheet data found.

## 2021-02-05 NOTE — NC FL2 (Signed)
[place] LEVEL OF CARE SCREENING TOOL     IDENTIFICATION  Patient Name: [name] Birthdate: 07-Sep-1931 Sex: female Admission Date (Current Location): 01/30/2021  [facility] and [place] Number:  Herbalist and Address:  [facility], [place] [address] [place], [place] [address]      Provider Number: [id number]  Attending Physician Name and Address:  Kerney Elbe, DO  Relative Name and Phone Number:  [place], [place] [phone]  [phone]    Current Level of Care:   Recommended Level of Care: [place] Prior Approval Number:    Date Approved/Denied:   PASRR Number:    Discharge Plan: Other (Comment) (ALF)    Current Diagnoses: Patient Active Problem List   Diagnosis Date Noted  . Acute respiratory failure with hypoxia and hypercapnia ([place]) 01/31/2021  . Acute on chronic diastolic CHF (congestive heart failure) ([place]) 01/31/2021  . Elevated troponin 01/31/2021  . Type 2 diabetes mellitus with hyperlipidemia ([place]) 01/31/2021  . Neuropathy 08/19/2020  . PAD (peripheral artery disease) ([place]) 02/29/2020  . Chronic kidney disease (CKD) 02/20/2020  . Osteoarthritis 08/01/2019  . Arthritis of left sacroiliac joint 05/04/2017  . Osteopenia 02/17/2017  . Piriformis syndrome of left side 02/16/2017  . Acquired leg length discrepancy 02/16/2017  . COPD GOLD I  01/27/2017  . Lumbar radiculopathy, acute 01/27/2017  . Granuloma annulare 07/27/2016  . Numbness of fingers 07/27/2016  . Primary osteoarthritis of right knee 06/27/2016  . Rash and nonspecific skin eruption 06/09/2016  . Diabetes ([place]) 03/07/2016  . OSA  08/28/2014  . Chronic diastolic heart failure ([place]) 07/30/2014  . Nocturnal hypoxemia 07/09/2014  . SOB (shortness of breath) 05/15/2014  . Pulmonary HTN ([place]) 05/15/2014  . Band keratopathy 01/16/2014  . 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 Hyperthyroidism 11/04/2011  .  Multinodular goiter 10/14/2011  . Atrophy, Fuchs' 09/29/2011  . ATRIAL FIBRILLATION 06/03/2010  . VITILIGO 11/07/2009  . SINOATRIAL NODE DYSFUNCTION 06/16/2009  . Atrial flutter ([place]) 06/25/2008  . HYPERLIPIDEMIA 01/29/2008  . Coronary atherosclerosis 01/29/2008  . HTN (hypertension) 10/30/2007    Orientation RESPIRATION BLADDER Height & Weight     Self,Place,Situation  O2 Incontinent Weight: 167 lb 6.4 oz (75.9 kg) Height:  5\' 4"  (162.6 cm)  BEHAVIORAL SYMPTOMS/MOOD NEUROLOGICAL BOWEL NUTRITION STATUS      Continent Diet (see discharge summary)  AMBULATORY STATUS COMMUNICATION OF NEEDS Skin   Independent Verbally Skin abrasions                       Personal Care Assistance Level of Assistance  Bathing,Feeding,Dressing Bathing Assistance: Independent Feeding assistance: Independent Dressing Assistance: Independent     Functional Limitations Info  Sight,Hearing,Speech Sight Info: Adequate Hearing Info: Adequate Speech Info: Adequate    SPECIAL CARE FACTORS FREQUENCY                       Contractures Contractures Info: Not present    Additional Factors Info  Code Status,Allergies Code Status Info: DNR Allergies Info: Levaquin (Levofloxacin), Lobster (Shellfish Allergy), Penicillins, Valsartan, Codeine, Lipitor (Atorvastatin), Oxycodone, Statins, Tramadol, Amlodipine Besylate, Crestor (Rosuvastatin Calcium), Tramadol Hcl           Current Medications (02/05/2021):  This is the current hospital active medication list Current Facility-Administered Medications  Medication Dose Route Frequency Provider Last Rate Last Admin  . 0.9 %  sodium chloride infusion  250 mL Intravenous PRN Charlann Lange, MD      .  acetaminophen (TYLENOL) tablet 650 mg  650 mg Oral Q4H PRN Nicoletta Dress, Na, MD   650 mg at 02/03/21 0629  . apixaban (ELIQUIS) tablet 5 mg  5 mg Oral BID Nicoletta Dress, Na, MD   5 mg at 02/05/21 1018  . carvedilol (COREG) tablet 6.25 mg  6.25 mg Oral BID WC Nicoletta Dress, Na, MD   6.25 mg at  02/05/21 0844  . cholecalciferol (VITAMIN D3) tablet 1,000 Units  1,000 Units Oral QPC supper Charlann Lange, MD   1,000 Units at 02/04/21 1820  . clopidogrel (PLAVIX) tablet 75 mg  75 mg Oral Daily Li, Na, MD   75 mg at 02/05/21 1018  . ezetimibe (ZETIA) tablet 10 mg  10 mg Oral Daily Li, Na, MD   10 mg at 02/05/21 1018  . furosemide (LASIX) tablet 80 mg  80 mg Oral BID Sheikh, Omair Latif, DO      . insulin aspart (novoLOG) injection 0-5 Units  0-5 Units Subcutaneous QHS Charlann Lange, MD   0 Units at 02/04/21 2127  . insulin aspart (novoLOG) injection 0-9 Units  0-9 Units Subcutaneous TID WC Nicoletta Dress, Na, MD   5 Units at 02/05/21 1158  . insulin glargine (LANTUS) injection 15 Units  15 Units Subcutaneous QHS Charlann Lange, MD   15 Units at 02/04/21 2127  . ipratropium-albuterol (DUONEB) 0.5-2.5 (3) MG/3ML nebulizer solution 3 mL  3 mL Nebulization Q4H PRN Caren Griffins, MD   3 mL at 02/02/21 2213  . nitroGLYCERIN (NITROSTAT) SL tablet 0.4 mg  0.4 mg Sublingual Q5 min PRN Nicoletta Dress, Na, MD      . ondansetron (ZOFRAN) injection 4 mg  4 mg Intravenous Q6H PRN Li, Na, MD      . sildenafil (REVATIO) tablet 20 mg  20 mg Oral TID Bensimhon, Shaune Pascal, MD   20 mg at 02/05/21 1018  . sodium chloride flush (NS) 0.9 % injection 3 mL  3 mL Intravenous Q12H Li, Na, MD   3 mL at 02/05/21 1043  . sodium chloride flush (NS) 0.9 % injection 3 mL  3 mL Intravenous PRN Li, Na, MD      . sodium chloride flush (NS) 0.9 % injection 3 mL  3 mL Intravenous Q12H Simmons, Brittainy M, PA-C   3 mL at 02/05/21 0840  . traZODone (DESYREL) tablet 25 mg  25 mg Oral QHS PRN Caren Griffins, MD   25 mg at 02/04/21 2320     Discharge Medications: Please see discharge summary for a list of discharge medications.  Relevant Imaging Results:  Relevant Lab Results:   Additional Information SSN: [id number]  Joanne Chars, LCSW

## 2021-02-05 NOTE — Progress Notes (Signed)
Physical Therapy Treatment Patient Details Name: [name] MRN: [id number] DOB: Jan 24, 1931 Today's Date: 02/05/2021    History of Present Illness 87 y.o. female with CAD, HTN, atrial fibrillation/A flutter and chronic prior systolic heart failure, and diastolic heart failure, as well as pulmonary hypertension. Pt Presented to [facility] on 2/4 w/ CC of increased dyspnea and LE edema. Pt underwent R heart cath on 02/04/2021.    PT Comments    Pt is expecting to return to ALF today and noted her gait has become much more steady and O 2 is controlled on cannula. Her family will accompany her to return and help with this transition, as well as bringing O2 to the building.  Her plan is to continue HHPT with facility arrangement, and will anticipate this progresses her to a more independent and stable point given her recent progress.  See for goals of acute PT if she is not dc today.   Follow Up Recommendations  Home health PT;Supervision for mobility/OOB     Equipment Recommendations  Other (comment) (O2)    Recommendations for Other Services       Precautions / Restrictions Precautions Precautions: Fall Precaution Comments: monitor SpO2 Restrictions Weight Bearing Restrictions: No    Mobility  Bed Mobility Overal bed mobility: Needs Assistance             General bed mobility comments: in chair when PT arrived    Transfers Overall transfer level: Needs assistance Equipment used: Rolling walker (2 wheeled) Transfers: Sit to/from Stand Sit to Stand: Supervision         General transfer comment: supervision for lines adn safety  Ambulation/Gait Ambulation/Gait assistance: Min guard Gait Distance (Feet): 200 Feet Assistive device: Rolling walker (2 wheeled) Gait Pattern/deviations: Step-through pattern;Wide base of support;Decreased stride length;Trunk flexed Gait velocity: reduced Gait velocity interpretation: <1.31 ft/sec, indicative of household  ambulator General Gait Details: slow pace with observation of O2 sats, at 96% with mobility   Stairs             Wheelchair Mobility    Modified Rankin (Stroke Patients Only)       Balance Overall balance assessment: Needs assistance Sitting-balance support: Feet supported Sitting balance-Leahy Scale: Good     Standing balance support: Bilateral upper extremity supported Standing balance-Leahy Scale: Fair Standing balance comment: fair and requires O2                            Cognition Arousal/Alertness: Awake/alert Behavior During Therapy: WFL for tasks assessed/performed Overall Cognitive Status: Impaired/Different from baseline Area of Impairment: Awareness;Problem solving                         Safety/Judgement: Decreased awareness of deficits Awareness: Intellectual          Exercises General Exercises - Lower Extremity Ankle Circles/Pumps: AAROM;5 reps Quad Sets: AAROM;10 reps    General Comments General comments (skin integrity, edema, etc.): in the chair when PT arrived, work on controlled gait with tank, able to go longer trip today and maintained sats 93-96% with the effort.  back to room to discuss stretches and ex      Pertinent Vitals/Pain Pain Assessment: No/denies pain    Home Living                      Prior Function            PT  Goals (current goals can now be found in the care plan section) Acute Rehab PT Goals Patient Stated Goal: get home, stay well Progress towards PT goals: Progressing toward goals    Frequency    Min 3X/week      PT Plan Current plan remains appropriate    Co-evaluation              AM-PAC PT "6 Clicks" Mobility   Outcome Measure  Help needed turning from your back to your side while in a flat bed without using bedrails?: A Little Help needed moving from lying on your back to sitting on the side of a flat bed without using bedrails?: A Little Help needed  moving to and from a bed to a chair (including a wheelchair)?: A Little Help needed standing up from a chair using your arms (e.g., wheelchair or bedside chair)?: A Little Help needed to walk in hospital room?: A Little Help needed climbing 3-5 steps with a railing? : A Lot 6 Click Score: 17    End of Session Equipment Utilized During Treatment: Oxygen Activity Tolerance: Patient tolerated treatment well Patient left: in chair;with call bell/phone within reach Nurse Communication: Mobility status PT Visit Diagnosis: Other abnormalities of gait and mobility (R26.89)     Time: 0981-1914 PT Time Calculation (min) (ACUTE ONLY): 26 min  Charges:  $Gait Training: 8-22 mins $Therapeutic Exercise: 8-22 mins                  Ramond Dial 02/05/2021, 4:52 PM  Mee Hives, PT MS Acute Rehab Dept. Number: [place] and [place]

## 2021-02-05 NOTE — Discharge Summary (Incomplete)
Physician Discharge Summary  [name]:[id number] DOB: 06-11-1931 DOA: 01/30/2021  PCP: [name] Rail, MD  Admit date: 01/30/2021 Discharge date: 02/05/2021  Admitted From: Home Disposition: Home with [place]   Recommendations for Outpatient Follow-up:  1. Follow up with PCP in 1-2 weeks 2. Please obtain BMP/CBC in one week 3. Please follow up on the following pending results:  Home Health:***  Equipment/Devices:***    Discharge Condition:***  CODE STATUS:***  Diet recommendation:   Brief/Interim Summary: Patient is an 87 year old African-American female with a past medical history significant for but not limited to chronic combined CHF, CAD with a prior MI in 2008, history of atrial fibrillation a flutter on anticoagulation with Eliquis, history of peripheral arterial disease status post right external iliac artery stenting as well as history of diabetes mellitus type 2, hypertension, hyperlipidemia, COPD, OSA on CPAP as well as other comorbidities who presented with shortness of breath and the inability to lay flat and had worsening lower extremity edema.  She follows with cardiology in outpatient and had a left heart cath in May of last year which showed severe three-vessel obstructive CAD and she underwent status post PCI  of th proximal left circumflex/OM1 with DES x1 with IVUS guidance and PCI of the proximal LAD with orbital atherectomy and DES x1.  She is currently on Plavix 75 mg and Eliquis 5 mg p.o. twice daily.  Over the past few months she has had progressively worsening dyspnea and was unable to lie flat and had an inability to walk a few steps without having to stop.  She been compliant with her diuretics but does not have a scale does not weigh herself.  Work-up in the ED showed concern for interstitial edema and bilateral small pleural effusions and she was hypoxic quiring BiPAP placement.  Cardiology is consulted and she was initiated on aggressive  diuresis.  Advanced heart failure team evaluated and she underwent a right heart cath which showed severe pulmonary arterial hypertension.  Cardiology recommending another dose of IV Lasix today and starting sildenafil 20 g 3 times daily for PAH.  PT OT has to further evaluate and treat  Discharge Diagnoses:  Principal Problem:   Acute respiratory failure with hypoxia and hypercapnia (HCC) Active Problems:   HYPERLIPIDEMIA   HTN (hypertension)   Coronary atherosclerosis   ATRIAL FIBRILLATION   Atrial flutter (HCC)   Hyperthyroidism   Pulmonary HTN (HCC)   Chronic diastolic heart failure (HCC)   OSA    COPD GOLD I    Chronic kidney disease (CKD)   PAD (peripheral artery disease) (HCC)   Acute on chronic diastolic CHF (congestive heart failure) (HCC)   Elevated troponin   Type 2 diabetes mellitus with hyperlipidemia (HCC)  Acute Hypoxic Respiratory Dailure requiring NIPPV with BiPAP due to Acute on Chronic Combined Systolic and Diastolic CHF complicated by [facility] -Patient was admitted to the hospital, hypoxic initially requiring BiPAP.  -A 2D echo done 2/5 showed EF 40-45%, LV has global hypokinesis, LVH and mildly reduced RV function. The LV function seems to be slightly worse based on the most recent echo in May 2021 -SpO2: 98 % O2 Flow Rate (L/min): 2 L/min -Remains on supplemental oxygen with 2 L.  -Started on IV Lasix, renal function is stable and will continue today. Increasedto 80 mg 2/7 by -Cardiology and they are recommending giving another dose of IV Lasix today. Weight has remained stablebut weight is essentially the same but not repeated in 3 days;  Cardiology feels that her volume status is much improved with Diuresis  -Cardiology took the patient for RHC and showed severe Mixed Pulmonary HTN with Cor Pulmonale  -Cardiology to start Sildenafil 20 mg TID for PAH -There is certainly a degree of high salt intake but it seems to be outside of patient's control as the foods are  being cooked for her in her retirement community -Continue strict ins and outs, daily weights; Patient is -4.650 Liters since admission  -Cardiology following, appreciate input  Generalized Weakness and Deconditioning -In the setting of Above -PT and OT to evaluate and treat and recommending Home Health PT  Coronary Artery Disease, prior MI/stenting, PAD -No chest pain, no symptoms to suggest active events. Troponin is mildly elevated likely demand -Continue Clopidogrel 75 mg po Daily, Carvedilol 6.25 mg po BID, and NTG 0.4 mg SL q73min pRN  Chronic A. Fib/Flutter -Continue Carvedilol 6.25 mg po BID and Apixaban 5 mg po BID  -C/w Telemetry Monitoring   Acquired Thrombophilia -In the Setting of Paroxsymal Atrial Fib/Flutter -CHA2DS2-VASc of at least 5 -C/w Apixaban 5 mg po BID and continue to Monitor for bleeding   Erthrocytosis -In the setting of Diuresis -Patient's Hgb/Hct is now 15.7/50.9 yesterday and is now 14.7/46.5 -Continue to Monitor and Trend and repeat CBC in the AM   COPD -No wheezing, this appears stable, she is on oxygen but probably due to #1 -C/w DuoNeb 3 mL q4hprn Wheezing and SOB   OSA on CPAP -Continue home CPAP  HTN -C/w Carvedilol 6.25 mg po BID  -Continue to Monitor BP per Protocol  -Last BP was 136/80  DM2 -Continue Lantus 15 units sq qHS, Sensitive Novolog Sliding Scale AC/HS , CBGs acceptable and ranging from 161-287 -Continue to Monitor CBG's carefully and adjust Insulin Regimen as Necessary  Hyperlipidemia -Continue home medications with Ezetimibe 10 mg po Daily -Takes Repatha an outpatient due to Statin Intolerance -Continue to Follow up at [facility] at D/C   Overweight -Complicates overall prognosis and care -Estimated body mass index is 28.73 kg/m as calculated from the following:   Height as of this encounter: 5\' 4"  (1.626 m).   Weight as of this encounter: 75.9 kg. -Weight Loss and Dietary Counseling given   Discharge  Instructions   Allergies as of 02/05/2021      Reactions   Levaquin [levofloxacin] Shortness Of Breath, Swelling   angioedema   Lobster [shellfish Allergy] Other (See Comments)   angioedema   Penicillins Rash   Has patient had a PCN reaction causing immediate rash, facial/tongue/throat swelling, SOB or lightheadedness with hypotension: Yes Has patient had a PCN reaction causing severe rash involving mucus membranes or skin necrosis: No Has patient had a PCN reaction that required hospitalization: No Has patient had a PCN reaction occurring within the last 10 years: No If all of the above answers are "NO", then may proceed with Cephalosporin use.   Valsartan Other (See Comments)   REACTION: angioedema   Codeine Other (See Comments)   Mental status changes   Lipitor [atorvastatin] Other (See Comments)   weakness   Oxycodone Other (See Comments)   hallucinations   Statins    Made her too weak   Tramadol    Amlodipine Besylate Other (See Comments)   REACTION: tingling in lips & gum edema 7/12: talked to patient, states she is tolerating well   Crestor [rosuvastatin Calcium] Rash   Tramadol Hcl Nausea And Vomiting    Med Rec must be completed prior to  using this [facility]***       Follow-up Information    Bensimhon, Shaune Pascal, MD Follow up on 02/19/2021.   Specialty: Cardiology Why: 10:20 AM [facility] Parking Garage Code 1212  Contact information: [place] [address] [phone]              Allergies  Allergen Reactions  . Levaquin [Levofloxacin] Shortness Of Breath and Swelling    angioedema  . Lobster [Shellfish Allergy] Other (See Comments)    angioedema  . Penicillins Rash    Has patient had a PCN reaction causing immediate rash, facial/tongue/throat swelling, SOB or lightheadedness with hypotension: Yes Has patient had a PCN reaction causing severe rash involving mucus membranes or skin necrosis:  No Has patient had a PCN reaction that required hospitalization: No Has patient had a PCN reaction occurring within the last 10 years: No If all of the above answers are "NO", then may proceed with Cephalosporin use.  . Valsartan Other (See Comments)    REACTION: angioedema  . Codeine Other (See Comments)    Mental status changes  . Lipitor [Atorvastatin] Other (See Comments)    weakness  . Oxycodone Other (See Comments)    hallucinations  . Statins     Made her too weak  . Tramadol   . Amlodipine Besylate Other (See Comments)    REACTION: tingling in lips & gum edema 7/12: talked to patient, states she is tolerating well  . Crestor [Rosuvastatin Calcium] Rash  . Tramadol Hcl Nausea And Vomiting    Consultations:  ***   Procedures/Studies: DG Chest 2 View  Result Date: 01/30/2021 CLINICAL DATA:  Shortness of breath. Bilateral ankle swelling. History of CHF. EXAM: CHEST - 2 VIEW COMPARISON:  05/15/2020 FINDINGS: The cardiac silhouette is borderline enlarged. Aortic atherosclerosis is noted. The interstitial markings are mildly increased diffusely compared to the prior study with a few Kerley lines. There are new small bilateral pleural effusions. No pneumothorax is identified. No acute osseous abnormality is seen. IMPRESSION: New mild interstitial edema and small bilateral pleural effusions. Electronically Signed   By: Logan Bores M.D.   On: 01/30/2021 11:07   CT Angio Chest PE W/Cm &/Or Wo Cm  Result Date: 01/31/2021 CLINICAL DATA:  Positive D-dimer. EXAM: CT ANGIOGRAPHY CHEST WITH CONTRAST TECHNIQUE: Multidetector CT imaging of the chest was performed using the standard protocol during bolus administration of intravenous contrast. Multiplanar CT image reconstructions and MIPs were obtained to evaluate the vascular anatomy. CONTRAST:  75mL OMNIPAQUE IOHEXOL 350 MG/ML SOLN COMPARISON:  May 25, 2017. FINDINGS: Cardiovascular: Satisfactory opacification of the pulmonary arteries to the  segmental level. No evidence of pulmonary embolism. Mild cardiomegaly is noted. No pericardial effusion. Atherosclerosis of thoracic aorta is noted without aneurysm formation. Coronary artery calcifications are noted with probable stent seen in the left anterior descending artery. Mediastinum/Nodes: Small sliding-type hiatal hernia is noted. Thyroid gland is unremarkable. 1.4 cm subcarinal adenopathy is noted. 13 mm right hilar adenopathy is noted. 10 mm aortopulmonary window adenopathy is noted. This may be inflammatory in etiology, but neoplasm cannot be excluded. Lungs/Pleura: No pneumothorax or pleural effusion is noted. Emphysematous disease is noted in the upper lobes bilaterally. Mild bilateral posterior basilar subsegmental atelectasis is noted. Upper Abdomen: No acute abnormality. Musculoskeletal: No chest wall abnormality. No acute or significant osseous findings. Review of the MIP images confirms the above findings. IMPRESSION: 1. No definite evidence of pulmonary embolus. 2. Coronary artery calcifications are noted  with probable stent seen in the left anterior descending artery. 3. Small sliding-type hiatal hernia. 4. Mediastinal adenopathy is noted which may be inflammatory in etiology, but neoplasm cannot be excluded. Follow-up CT scan in 3 months is recommended to ensure stability or resolution. 5. Emphysema and aortic atherosclerosis. Aortic Atherosclerosis (ICD10-I70.0) and Emphysema (ICD10-J43.9). Electronically Signed   By: Marijo Conception M.D.   On: 01/31/2021 11:49   CARDIAC CATHETERIZATION  Result Date: 02/04/2021 Findings: RA = 6 RV = 88/5 PA =  86/33 (53) PCW = 21 Fick cardiac output/index = 3.4/1.9 PVR = 9.4 WU Ao sat = 97% PA sat = 64%, 64% PAPi = 8.8 Assessment: 1. Severe mixed pulmonary HTN with cor pulmonale 2. Mildly elevated left-sided filling pressures Plan/Discussion: Volume status much improved. Will give on more dose IV lasix today and then can probably go home this afternoon or  even. Will start sildenafil 20 tid carefully for PAH. Glori Bickers, MD 9:24 AM  ECHOCARDIOGRAM COMPLETE  Result Date: 01/31/2021    ECHOCARDIOGRAM REPORT   Patient Name:   [name] Date of Exam: 01/31/2021 Medical Rec #:  [id number]         Height:       63.0 in Accession #:    [id number]        Weight:       163.4 lb Date of Birth:  04-14-1931        BSA:          1.774 m Patient Age:    87 years          BP:           169/49 mmHg Patient Gender: F                 HR:           53 bpm. Exam Location:  Inpatient Procedure: 2D Echo, Cardiac Doppler and Color Doppler Indications:    I50.23 Acute on chronic systolic (congestive) heart failure  History:        Patient has prior history of Echocardiogram examinations, most                 recent 05/16/2020. Previous Myocardial Infarction, CAD and                 Angina, COPD, Arrythmias:Atrial Fibrillation,                 Signs/Symptoms:Murmur; Risk Factors:Hypertension, Diabetes,                 Dyslipidemia and Sleep Apnea.  Sonographer:    Tiffany Dance Referring Phys: [id number] NA LI IMPRESSIONS  1. Left ventricular ejection fraction, by estimation, is 40 to 45%. The left ventricle has mildly decreased function. The left ventricle demonstrates global hypokinesis. There is moderate left ventricular hypertrophy. Left ventricular diastolic function  could not be evaluated.  2. Right ventricular systolic function is mildly reduced. The right ventricular size is normal.  3. Left atrial size was moderately dilated.  4. The mitral valve is abnormal. Mild mitral valve regurgitation. Moderate mitral annular calcification.  5. The aortic valve is tricuspid. Aortic valve regurgitation is not visualized. Comparison(s): Changes from prior study are noted. 05/16/20: LVEF 50-55%, grade 3 DD. FINDINGS  Left Ventricle: Left ventricular ejection fraction, by estimation, is 40 to 45%. The left ventricle has mildly decreased function. The left ventricle demonstrates global  hypokinesis. The left ventricular internal cavity size was normal in size. There  is  moderate left ventricular hypertrophy. Left ventricular diastolic function could not be evaluated due to atrial fibrillation. Left ventricular diastolic function could not be evaluated. Right Ventricle: The right ventricular size is normal. No increase in right ventricular wall thickness. Right ventricular systolic function is mildly reduced. Left Atrium: Left atrial size was moderately dilated. Right Atrium: Right atrial size was normal in size. Pericardium: There is no evidence of pericardial effusion. Mitral Valve: The mitral valve is abnormal. Moderate mitral annular calcification. Mild mitral valve regurgitation, with posteriorly-directed jet. Tricuspid Valve: The tricuspid valve is grossly normal. Tricuspid valve regurgitation is trivial. Aortic Valve: The aortic valve is tricuspid. Aortic valve regurgitation is not visualized. Pulmonic Valve: The pulmonic valve was grossly normal. Pulmonic valve regurgitation is not visualized. Aorta: The aortic root and ascending aorta are structurally normal, with no evidence of dilitation. IAS/Shunts: No atrial level shunt detected by color flow Doppler.  LEFT VENTRICLE PLAX 2D LVIDd:         3.70 cm LVIDs:         2.90 cm LV PW:         1.20 cm LV IVS:        1.30 cm LVOT diam:     1.90 cm LV SV:         44 LV SV Index:   25 LVOT Area:     2.84 cm  RIGHT VENTRICLE          IVC RV Basal diam:  2.90 cm  IVC diam: 1.80 cm TAPSE (M-mode): 1.6 cm LEFT ATRIUM             Index       RIGHT ATRIUM           Index LA diam:        4.70 cm 2.65 cm/m  RA Area:     18.30 cm LA Vol (A2C):   77.9 ml 43.90 ml/m RA Volume:   51.90 ml  29.25 ml/m LA Vol (A4C):   78.5 ml 44.24 ml/m LA Biplane Vol: 80.4 ml 45.31 ml/m  AORTIC VALVE LVOT Vmax:   85.80 cm/s LVOT Vmean:  60.750 cm/s LVOT VTI:    0.154 m  AORTA Ao Root diam: 3.20 cm MITRAL VALVE MV Area (PHT): 3.71 cm    SHUNTS MV Decel Time: 205 msec     Systemic VTI:  0.15 m MV E velocity: 93.00 cm/s  Systemic Diam: 1.90 cm Lyman Bishop MD Electronically signed by Lyman Bishop MD Signature Date/Time: 01/31/2021/4:42:34 PM    Final    VAS [place] LOWER EXTREMITY VENOUS (DVT)  Result Date: 02/01/2021  Lower Venous DVT Study Indications: Edema, and SOB.  Risk Factors: Extensive history of peripheral arterial disease. Anticoagulation: Eliquis. Comparison Study: Performing Technologist: Darlin Coco RDMS  Examination Guidelines: A complete evaluation includes B-mode imaging, spectral Doppler, color Doppler, and power Doppler as needed of all accessible portions of each vessel. Bilateral testing is considered an integral part of a complete examination. Limited examinations for reoccurring indications may be performed as noted. The reflux portion of the exam is performed with the patient in reverse Trendelenburg.  +---------+---------------+---------+-----------+----------+--------------+ RIGHT    CompressibilityPhasicitySpontaneityPropertiesThrombus Aging +---------+---------------+---------+-----------+----------+--------------+ CFV      Full           Yes      Yes                                 +---------+---------------+---------+-----------+----------+--------------+  SFJ      Full                                                        +---------+---------------+---------+-----------+----------+--------------+ FV Prox  Full                                                        +---------+---------------+---------+-----------+----------+--------------+ FV Mid   Full                                                        +---------+---------------+---------+-----------+----------+--------------+ FV DistalFull                                                        +---------+---------------+---------+-----------+----------+--------------+ PFV      Full                                                         +---------+---------------+---------+-----------+----------+--------------+ POP      Full           Yes      Yes                                 +---------+---------------+---------+-----------+----------+--------------+ PTV      Full                                                        +---------+---------------+---------+-----------+----------+--------------+ PERO     Full                                                        +---------+---------------+---------+-----------+----------+--------------+   +---------+---------------+---------+-----------+----------+--------------+ LEFT     CompressibilityPhasicitySpontaneityPropertiesThrombus Aging +---------+---------------+---------+-----------+----------+--------------+ CFV      Full           Yes      Yes                                 +---------+---------------+---------+-----------+----------+--------------+ SFJ      Full                                                        +---------+---------------+---------+-----------+----------+--------------+  FV Prox  Full                                                        +---------+---------------+---------+-----------+----------+--------------+ FV Mid   Full                                                        +---------+---------------+---------+-----------+----------+--------------+ FV DistalFull                                                        +---------+---------------+---------+-----------+----------+--------------+ PFV      Full                                                        +---------+---------------+---------+-----------+----------+--------------+ POP      Full           Yes      Yes                                 +---------+---------------+---------+-----------+----------+--------------+ PTV      Full                                                         +---------+---------------+---------+-----------+----------+--------------+ PERO     Full                                                        +---------+---------------+---------+-----------+----------+--------------+     Summary: RIGHT: - There is no evidence of deep vein thrombosis in the lower extremity.  - No cystic structure found in the popliteal fossa.  LEFT: - There is no evidence of deep vein thrombosis in the lower extremity.  - No cystic structure found in the popliteal fossa.  *See table(s) above for measurements and observations. Electronically signed by Monica Martinez MD on 02/01/2021 at 7:54:58 PM.    Final       Subjective:   Discharge Exam: Vitals:   02/05/21 0040 02/05/21 0407  BP:  125/81  Pulse: 62 (!) 59  Resp: (!) 28 20  Temp:  98 F (36.7 C)  SpO2: 95% 91%   Vitals:   02/04/21 1940 02/05/21 0034 02/05/21 0040 02/05/21 0407  BP: 124/60 (!) 141/69  125/81  Pulse: 73 63 62 (!) 59  Resp: 19 (!) 24 (!) 28 20  Temp: (!) 97.4 F (36.3 C) 98.1 F (36.7 C)  98 F (36.7 C)  TempSrc: Oral Oral  Oral  SpO2: 94% 96% 95% 91%  Weight:      Height:        General: Pt is alert, awake, not in acute distress Cardiovascular: RRR, S1/S2 +, no rubs, no gallops Respiratory: CTA bilaterally, no wheezing, no rhonchi Abdominal: Soft, NT, ND, bowel sounds + Extremities: no edema, no cyanosis    The results of significant diagnostics from this hospitalization (including imaging, microbiology, ancillary and laboratory) are listed below for reference.     Microbiology: Recent Results (from the past 240 hour(s))  SARS Coronavirus 2 by RT PCR (hospital order, performed in [facility] hospital lab) Nasopharyngeal Nasopharyngeal Swab     Status: None   Collection Time: 01/31/21  2:49 AM   Specimen: Nasopharyngeal Swab  Result Value Ref Range Status   SARS Coronavirus 2 NEGATIVE NEGATIVE Final    Comment: (NOTE) SARS-CoV-2 target nucleic acids are NOT  DETECTED.  The SARS-CoV-2 RNA is generally detectable in upper and lower respiratory specimens during the acute phase of infection. The lowest concentration of SARS-CoV-2 viral copies this assay can detect is 250 copies / mL. A negative result does not preclude SARS-CoV-2 infection and should not be used as the sole basis for treatment or other patient management decisions.  A negative result may occur with improper specimen collection / handling, submission of specimen other than nasopharyngeal swab, presence of viral mutation(s) within the areas targeted by this assay, and inadequate number of viral copies (<250 copies / mL). A negative result must be combined with clinical observations, patient history, and epidemiological information.  Fact Sheet for Patients:   StrictlyIdeas.no  Fact Sheet for Healthcare Providers: BankingDealers.co.za  This test is not yet approved or  cleared by the [place] FDA and has been authorized for detection and/or diagnosis of SARS-CoV-2 by FDA under an Emergency Use Authorization (EUA).  This EUA will remain in effect (meaning this test can be used) for the duration of the COVID-19 declaration under Section 564(b)(1) of the Act, 21 U.S.C. section 360bbb-3(b)(1), unless the authorization is terminated or revoked sooner.  Performed at [facility], [place] [address] [place],  [address]     Labs: BNP (last 3 results) Recent Labs    06/09/20 1130 12/18/20 1208 01/30/21 1037  BNP 225.6* 134.0* 638.4*   Basic Metabolic Panel: Recent Labs  Lab 02/01/21 0105 02/02/21 0059 02/03/21 0157 02/04/21 0255 02/04/21 0913 02/05/21 0215  NA 140 136 138 140 144 139  K 4.1 4.2 3.9 4.0 3.3* 3.7  CL 97* 95* 94* 94*  --  92*  CO2 33* 32 32 35*  --  37*  GLUCOSE 149* 138* 167* 135*  --  183*  BUN 15 19 20 16   --  16  CREATININE 0.91 0.97 0.84 0.79  --  0.84  CALCIUM 9.0 8.9 9.2 9.6  --   9.7  MG  --   --   --   --   --  1.8  PHOS  --   --   --   --   --  3.2   Liver Function Tests: Recent Labs  Lab 01/31/21 0758 02/01/21 0105 02/02/21 0059 02/05/21 0215  AST 33 23 23 20   ALT 28 19 15 18   ALKPHOS 101 76 72 78  BILITOT 1.0 1.1 1.3* 0.9  PROT 7.9 6.4* 6.0* 6.4*  ALBUMIN 3.7 3.0* 2.8* 3.0*   No results for input(s): LIPASE, AMYLASE in the last 168 hours. No results for input(s): AMMONIA in  the last 168 hours. CBC: Recent Labs  Lab 02/01/21 0105 02/02/21 0059 02/03/21 0157 02/04/21 0255 02/04/21 0913 02/05/21 0215  WBC 5.3 5.2 4.2 5.8  --  5.1  NEUTROABS  --   --   --   --   --  3.0  HGB 15.1* 14.1 15.7* 15.7* 16.0* 14.7  HCT 48.7* 46.1* 51.9* 50.9* 47.0* 46.5*  MCV 98.2 98.1 96.6 97.9  --  95.7  PLT 165 141* 145* 160  --  156   Cardiac Enzymes: No results for input(s): CKTOTAL, CKMB, CKMBINDEX, TROPONINI in the last 168 hours. BNP: Invalid input(s): POCBNP CBG: Recent Labs  Lab 02/04/21 0800 02/04/21 1227 02/04/21 1632 02/04/21 2124 02/05/21 0755  GLUCAP 189* 174* 287* 193* 147*   D-Dimer No results for input(s): DDIMER in the last 72 hours. Hgb A1c No results for input(s): HGBA1C in the last 72 hours. Lipid Profile No results for input(s): CHOL, HDL, LDLCALC, TRIG, CHOLHDL, LDLDIRECT in the last 72 hours. Thyroid function studies No results for input(s): TSH, T4TOTAL, T3FREE, THYROIDAB in the last 72 hours.  Invalid input(s): FREET3 Anemia work up No results for input(s): VITAMINB12, FOLATE, FERRITIN, TIBC, IRON, RETICCTPCT in the last 72 hours. Urinalysis    Component Value Date/Time   COLORURINE YELLOW 08/29/2016 1632   APPEARANCEUR CLOUDY (A) 08/29/2016 1632   LABSPEC 1.027 08/29/2016 1632   PHURINE 5.5 08/29/2016 1632   GLUCOSEU 100 (A) 08/29/2016 1632   GLUCOSEU 100 (A) 08/19/2015 1440   HGBUR LARGE (A) 08/29/2016 1632   BILIRUBINUR NEGATIVE 08/29/2016 1632   BILIRUBINUR negative 08/26/2016 1137   KETONESUR NEGATIVE  08/29/2016 1632   PROTEINUR 100 (A) 08/29/2016 1632   UROBILINOGEN 0.2 08/26/2016 1137   UROBILINOGEN 0.2 08/19/2015 1440   NITRITE POSITIVE (A) 08/29/2016 1632   LEUKOCYTESUR MODERATE (A) 08/29/2016 1632   Sepsis Labs Invalid input(s): PROCALCITONIN,  WBC,  LACTICIDVEN Microbiology Recent Results (from the past 240 hour(s))  SARS Coronavirus 2 by RT PCR (hospital order, performed in [facility] hospital lab) Nasopharyngeal Nasopharyngeal Swab     Status: None   Collection Time: 01/31/21  2:49 AM   Specimen: Nasopharyngeal Swab  Result Value Ref Range Status   SARS Coronavirus 2 NEGATIVE NEGATIVE Final    Comment: (NOTE) SARS-CoV-2 target nucleic acids are NOT DETECTED.  The SARS-CoV-2 RNA is generally detectable in upper and lower respiratory specimens during the acute phase of infection. The lowest concentration of SARS-CoV-2 viral copies this assay can detect is 250 copies / mL. A negative result does not preclude SARS-CoV-2 infection and should not be used as the sole basis for treatment or other patient management decisions.  A negative result may occur with improper specimen collection / handling, submission of specimen other than nasopharyngeal swab, presence of viral mutation(s) within the areas targeted by this assay, and inadequate number of viral copies (<250 copies / mL). A negative result must be combined with clinical observations, patient history, and epidemiological information.  Fact Sheet for Patients:   StrictlyIdeas.no  Fact Sheet for Healthcare Providers: BankingDealers.co.za  This test is not yet approved or  cleared by the [place] FDA and has been authorized for detection and/or diagnosis of SARS-CoV-2 by FDA under an Emergency Use Authorization (EUA).  This EUA will remain in effect (meaning this test can be used) for the duration of the COVID-19 declaration under Section 564(b)(1) of the Act, 21  U.S.C. section 360bbb-3(b)(1), unless the authorization is terminated or revoked sooner.  Performed at [facility]  [facility] Lab, [place] [address], [place], [place] [address]    Time coordinating discharge: 35 minutes  SIGNED:  Kerney Elbe, DO [facility] 02/05/2021, 8:05 AM Pager is on [place]  If 7PM-7AM, please contact night-coverage [email or web address]

## 2021-02-05 NOTE — Progress Notes (Addendum)
Advanced Heart Failure Rounding Note  PCP-Cardiologist: Pixie Casino, MD   Subjective:    RHC yesterday showed severe mixed pulmonary HTN w/ cor pulmonale and mildly elevated left-sided filling pressures. Received another dose of IV Lasix post cath. Sildenafil started.   Feels better today after diuresis.   RA = 6 RV = 88/5 PA =  86/33 (53) PCW = 21 Fick cardiac output/index = 3.4/1.9 PVR = 9.4 WU Ao sat = 97% PA sat = 64%, 64% PAPi = 8.8   Objective:   Weight Range: 75.9 kg Body mass index is 28.73 kg/m.   Vital Signs:   Temp:  [97.4 F (36.3 C)-98.2 F (36.8 C)] 98 F (36.7 C) (02/10 0407) Pulse Rate:  [59-81] 81 (02/10 0832) Resp:  [19-28] 20 (02/10 0407) BP: (124-160)/(58-99) 154/58 (02/10 0832) SpO2:  [91 %-96 %] 94 % (02/10 0832) Last BM Date: 02/04/21  Weight change: Filed Weights   01/31/21 2044 02/02/21 0802  Weight: 75.9 kg 75.9 kg    Intake/Output:   Intake/Output Summary (Last 24 hours) at 02/05/2021 1416 Last data filed at 02/05/2021 0954 Gross per 24 hour  Intake --  Output 1150 ml  Net -1150 ml      Physical Exam    General:  Well appearing elderly female. No resp difficulty HEENT: Normal Neck: Supple. JVP 7 cm . Carotids 2+ bilat; no bruits. No lymphadenopathy or thyromegaly appreciated. Cor: PMI nondisplaced. Regular rate & rhythm. No rubs, gallops or murmurs. Lungs: Clear Abdomen: Soft, nontender, nondistended. No hepatosplenomegaly. No bruits or masses. Good bowel sounds. Extremities: No cyanosis, clubbing, rash, edema Neuro: Alert & orientedx3, cranial nerves grossly intact. moves all 4 extremities w/o difficulty. Affect pleasant   Telemetry   AF 60s Personally reviewed   Labs    CBC Recent Labs    02/04/21 0255 02/04/21 0913 02/05/21 0215  WBC 5.8  --  5.1  NEUTROABS  --   --  3.0  HGB 15.7* 16.0* 14.7  HCT 50.9* 47.0* 46.5*  MCV 97.9  --  95.7  PLT 160  --  409   Basic Metabolic Panel Recent Labs     02/04/21 0255 02/04/21 0913 02/05/21 0215  NA 140 144 139  K 4.0 3.3* 3.7  CL 94*  --  92*  CO2 35*  --  37*  GLUCOSE 135*  --  183*  BUN 16  --  16  CREATININE 0.79  --  0.84  CALCIUM 9.6  --  9.7  MG  --   --  1.8  PHOS  --   --  3.2   Liver Function Tests Recent Labs    02/05/21 0215  AST 20  ALT 18  ALKPHOS 78  BILITOT 0.9  PROT 6.4*  ALBUMIN 3.0*   No results for input(s): LIPASE, AMYLASE in the last 72 hours. Cardiac Enzymes No results for input(s): CKTOTAL, CKMB, CKMBINDEX, TROPONINI in the last 72 hours.  BNP: BNP (last 3 results) Recent Labs    06/09/20 1130 12/18/20 1208 01/30/21 1037  BNP 225.6* 134.0* 171.8*    ProBNP (last 3 results) No results for input(s): PROBNP in the last 8760 hours.   D-Dimer No results for input(s): DDIMER in the last 72 hours. Hemoglobin A1C No results for input(s): HGBA1C in the last 72 hours. Fasting Lipid Panel No results for input(s): CHOL, HDL, LDLCALC, TRIG, CHOLHDL, LDLDIRECT in the last 72 hours. Thyroid Function Tests No results for input(s): TSH, T4TOTAL, T3FREE, THYROIDAB in  the last 72 hours.  Invalid input(s): FREET3  Other results:   Imaging    DG CHEST PORT 1 VIEW  Result Date: 02/05/2021 CLINICAL DATA:  Shortness of breath EXAM: PORTABLE CHEST 1 VIEW COMPARISON:  01/30/2021 FINDINGS: Cardiac shadow is at the upper limits of normal in size. Aortic calcifications are again seen. Lungs are well aerated bilaterally. No focal infiltrate or sizable effusion is noted. No bony abnormality is seen. IMPRESSION: No active disease. Electronically Signed   By: Inez Catalina M.D.   On: 02/05/2021 08:05     Medications:     Scheduled Medications: . apixaban  5 mg Oral BID  . carvedilol  6.25 mg Oral BID WC  . cholecalciferol  1,000 Units Oral QPC supper  . clopidogrel  75 mg Oral Daily  . ezetimibe  10 mg Oral Daily  . furosemide  80 mg Oral BID  . insulin aspart  0-5 Units Subcutaneous QHS  . insulin  aspart  0-9 Units Subcutaneous TID WC  . insulin glargine  15 Units Subcutaneous QHS  . sildenafil  20 mg Oral TID  . sodium chloride flush  3 mL Intravenous Q12H  . sodium chloride flush  3 mL Intravenous Q12H    Infusions: . sodium chloride      PRN Medications: sodium chloride, acetaminophen, ipratropium-albuterol, nitroGLYCERIN, ondansetron (ZOFRAN) IV, sodium chloride flush, traZODone   Assessment/Plan   1. PAH - RHC yesterday showed severe mixed pulmonary HTN w/ cor pulmonale and mildly elevated left-sided filling pressures - start Sildenafil 20 mg tid. Will monitor closely. F/u in [facility] next week  - home O2 ordered   2. Acute on chronic diastolic HF - RHC yesterday per above  - Volume status much improved with diuresis. Transition back to PO Lasix 80 mg bid. - Discussed low sodium diet and daily wts.   3. CAD - stable w/o CP - continue Plavix. No ASA w/ chronic Eliquis - zetia for HLD   4. Chronic AF - rate controlled continue Coreg and Eliquis    She has f/u with Dr. Haroldine Laws on 2/24. Appt info in AVS   Cardiac/PH Meds for Discharge Eliquis 5 mg bid Coreg 6.25 mg bid Plavix 75 mg daily  Lasix 80 mg bid  Sildenafil 20 mg tid  KCl 20 mg mEq BID   Length of Stay: 5  Brittainy Simmons, PA-C  02/05/2021, 2:16 PM  Advanced Heart Failure Team Pager [phone] (M-F; 7a - 4p)  Please contact [facility] for night-coverage after hours (4p -7a ) and weekends on [email or web address]  Patient seen and examined with the above-signed Advanced Practice Provider and/or Housestaff. I personally reviewed laboratory data, imaging studies and relevant notes. I independently examined the patient and formulated the important aspects of the plan. I have edited the note to reflect any of my changes or salient points. I have personally discussed the plan with the patient and/or family.  Volume status much improved.   General:  Elderly No resp difficulty HEENT: normal Neck: supple. no  JVD. Carotids 2+ bilat; no bruits. No lymphadenopathy or thryomegaly appreciated. Cor: PMI nondisplaced. Irregular rate & rhythm. No rubs, gallops or murmurs. Lungs: clear Abdomen: soft, nontender, nondistended. No hepatosplenomegaly. No bruits or masses. Good bowel sounds. Extremities: no cyanosis, clubbing, rash, edema Neuro: alert & orientedx3, cranial nerves grossly intact. moves all 4 extremities w/o difficulty. Affect pleasant  She is stable for d/c. Will arrange outpatient f/u.   Glori Bickers, MD  4:32 PM

## 2021-02-05 NOTE — Evaluation (Signed)
Occupational Therapy Evaluation Patient Details Name: [name] MRN: [id number] DOB: 06/05/1931 Today's Date: 02/05/2021    History of Present Illness 87 y.o. female with CAD, HTN, atrial fibrillation/A flutter and chronic prior systolic heart failure, and diastolic heart failure, as well as pulmonary hypertension. Pt Presented to [facility] on 2/4 w/ CC of increased dyspnea and LE edema. Pt underwent R heart cath on 02/04/2021.   Clinical Impression   PTA, pt resides at an ILF and reports Modified Independence for ADLs and mobility using Rollator and cane. Pt cooks her own breakfast every morning as well. Pt presents now with deficits in cardiopulmonary tolerance and advanced dynamic standing balance. Pt overall Supervision for hallway mobility, intermittent min guard for turning with RW but no LOB noted. Pt Supervision for ADLs in standing and LB ADLs to ensure safety. Provided energy conservation handout with plans to reinforce during daily tasks in future sessions. Plan to also progress endurance and wean O2 during ADLs/mobility.  Pt noted with low SpO2 at rest, SpO2 increases with activity. Pt received on 2 L O2, SpO2 85% at rest. Increases to 90% on 3 L O2 sitting EOB. After mobility on 3 L O2, 96% and on 2 L O2, 99%.     Follow Up Recommendations  Home health OT;Supervision - Intermittent (HHOT follow-up if pt discharges with supplemental O2)    Equipment Recommendations  Other (comment) (may need supplemental O2 on DC)    Recommendations for Other Services       Precautions / Restrictions Precautions Precautions: Fall Precaution Comments: monitor SpO2 Restrictions Weight Bearing Restrictions: No      Mobility Bed Mobility Overal bed mobility: Needs Assistance Bed Mobility: Supine to Sit     Supine to sit: Supervision     General bed mobility comments: Supervision for safety, increased time    Transfers Overall transfer level: Needs assistance Equipment used:  Rolling walker (2 wheeled) Transfers: Sit to/from Stand Sit to Stand: Supervision         General transfer comment: Supervision for sit to stand, min guard at times for turning with RW but overall supervision for mobility as well    Balance Overall balance assessment: Needs assistance Sitting-balance support: No upper extremity supported;Feet supported Sitting balance-Leahy Scale: Good     Standing balance support: Single extremity supported;During functional activity Standing balance-Leahy Scale: Fair Standing balance comment: fair static standing but benefits from UE support for mobility                           ADL either performed or assessed with clinical judgement   ADL Overall ADL's : Needs assistance/impaired Eating/Feeding: Independent;Sitting   Grooming: Supervision/safety;Standing;Brushing hair Grooming Details (indicate cue type and reason): Supervision standing at sink to brush hair. Noted to leave RW to the side and step to sink Upper Body Bathing: Independent;Sitting   Lower Body Bathing: Supervison/ safety;Sit to/from stand   Upper Body Dressing : Independent;Sitting   Lower Body Dressing: Supervision/safety;Sit to/from stand Lower Body Dressing Details (indicate cue type and reason): Bends down while sitting EOB to adjust socks before standing Toilet Transfer: Ambulation;RW;Supervision/safety   Toileting- Clothing Manipulation and Hygiene: Supervision/safety;Sit to/from stand       Functional mobility during ADLs: Supervision/safety;Rolling walker General ADL Comments: Pt with minor deficits in cardiopulmonary tolerance. O2 levels increase with activity, low at rest. Educated on energy conservation strategies with handout provided for pt to look over.     Vision Baseline  Vision/History: Wears glasses Wears Glasses: Reading only Patient Visual Report: No change from baseline Vision Assessment?: No apparent visual deficits     Perception      Praxis      Pertinent Vitals/Pain Pain Assessment: No/denies pain     Hand Dominance Right   Extremity/Trunk Assessment Upper Extremity Assessment Upper Extremity Assessment: Overall WFL for tasks assessed   Lower Extremity Assessment Lower Extremity Assessment: Defer to PT evaluation   Cervical / Trunk Assessment Cervical / Trunk Assessment: Kyphotic   Communication Communication Communication: No difficulties   Cognition Arousal/Alertness: Awake/alert Behavior During Therapy: WFL for tasks assessed/performed Overall Cognitive Status: Impaired/Different from baseline Area of Impairment: Safety/judgement;Awareness                         Safety/Judgement: Decreased awareness of deficits Awareness: Emergent   General Comments: A&Ox4, pleasant and asks detailed medical questions, motivated to prevent fluid overload again, return to normal functioning, etc. some decreased awareness of O2 cord mgmt, able to correct self with minor cues.   General Comments  Pt received on 2 L o2, SpO2 85% at rest but unable to increase above 88%. Increased to 3 L O2 with O2 90% sitting EOB. With mobility on 3 L O2, SpO2 at 96%, weaned to 2 L O2 anf spO2 99-100% after mobility. Encouraged pt to have pulse ox at home to monitor O2. Pt denies SOB with activity    Exercises     Shoulder Instructions      Home Living Family/patient expects to be discharged to:: Other (Comment) (independent living) Living Arrangements: Alone;Other (Comment) (staff) Available Help at Discharge: Family;Available PRN/intermittently;Other (Comment) (staff at [facility]) Type of Home: Apartment Home Access: Level entry     Home Layout: One level     Bathroom Shower/Tub: Walk-in shower             Additional Comments: has Rollator, cane and shower chair      Prior Functioning/Environment Level of Independence: Independent with assistive device(s)        Comments: Pt Independent at ILF with  Rollator, able to complete ADLs, makes breakfast in apartment every morning. Goes to dining hall for other meals (was on facility lock down in recent week - meals brought to rooms). Grocery shopping with use of shopping cart, uses cane in community. No O2 at baseline        OT Problem List: Decreased activity tolerance;Impaired balance (sitting and/or standing);Decreased safety awareness;Decreased knowledge of use of DME or AE;Cardiopulmonary status limiting activity      OT Treatment/Interventions: Self-care/ADL training;Therapeutic exercise;Energy conservation;DME and/or AE instruction;Therapeutic activities;Patient/family education;Balance training    OT Goals(Current goals can be found in the care plan section) Acute Rehab OT Goals Patient Stated Goal: to go home, be able to prevent O2 desats/fluid overload OT Goal Formulation: With patient Time For Goal Achievement: 02/19/21 Potential to Achieve Goals: Good ADL Goals Pt Will Transfer to Toilet: with modified independence;ambulating Pt/caregiver will Perform Home Exercise Program: Increased strength;Both right and left upper extremity;With theraband;Independently;With written HEP provided Additional ADL Goal #1: Pt to increase standing tolerance during ADLs/IADLs > 7 minutes Additional ADL Goal #2: Pt to demonstrate implementation of 3 energy conservation strategies during ADLs/IADLs Additional ADL Goal #3: Pt to independently monitor O2 and implement pursed lip breathing to maintain >88%  OT Frequency: Min 2X/week   Barriers to D/C:            Co-evaluation  AM-PAC OT "6 Clicks" Daily Activity     Outcome Measure Help from another person eating meals?: None Help from another person taking care of personal grooming?: A Little Help from another person toileting, which includes using toliet, bedpan, or urinal?: A Little Help from another person bathing (including washing, rinsing, drying)?: A Little Help from  another person to put on and taking off regular upper body clothing?: None Help from another person to put on and taking off regular lower body clothing?: A Little 6 Click Score: 20   End of Session Equipment Utilized During Treatment: Rolling walker;Gait belt;Oxygen Nurse Communication: Mobility status;Other (comment) (O2)  Activity Tolerance: Patient tolerated treatment well Patient left: in chair;with call bell/phone within reach;with nursing/sitter in room  OT Visit Diagnosis: Unsteadiness on feet (R26.81);Other (comment) (decreased cardiopulmonary tolerance)                Time: 4239-5320 OT Time Calculation (min): 41 min Charges:  OT General Charges $OT Visit: 1 Visit OT Evaluation $OT Eval Moderate Complexity: 1 Mod OT Treatments $Self Care/Home Management : 8-22 mins $Therapeutic Activity: 8-22 mins  Layla Maw, OTR/L  Layla Maw 02/05/2021, 12:12 PM

## 2021-02-05 NOTE — Progress Notes (Signed)
SATURATION QUALIFICATIONS: (This note is used to comply with regulatory documentation for home oxygen)  Patient Saturations on Room Air at Rest = 84%  Patient Saturations on Room Air while Ambulating = Not attempted as pt at 84% at rest on room air  Patient Saturations on 3 Liters of oxygen while Ambulating = 92%  Please briefly explain why patient needs home oxygen: Pt reports dyspnea at rest and when mobilizing with 2L of supplemental oxygen or less. Pt is able to tolerate increased activity when utilizing 3L of supplemental oxygen via nasal canula.

## 2021-02-05 NOTE — Progress Notes (Signed)
Inpatient Diabetes Program Recommendations  AACE/ADA: New Consensus Statement on Inpatient Glycemic Control (2015)  Target Ranges:  Prepandial:   less than 140 mg/dL      Peak postprandial:   less than 180 mg/dL (1-2 hours)      Critically ill patients:  140 - 180 mg/dL   Lab Results  Component Value Date   GLUCAP 283 (H) 02/05/2021   HGBA1C 9.5 (A) 12/02/2020    Review of Glycemic Control  Diabetes history: DM 2 Outpatient Diabetes medications: 75/25 36 units bid   Inpatient Diabetes Program Recommendations:    Based on insulin requirements in the hospital and dietary indiscretion at [facility], consider lowering 75/25 to 18 units bid.  Pt and daughter report that the p[t will make significant change in dietary habits and follow up with Dr. Loanne Drilling outpatient. Counseled daughter and pt on dietary changes needed to control glucose levels. Discussed A1c and glucose goals.  Thanks,  Tama Headings RN, MSN, BC-ADM Inpatient Diabetes Coordinator Team Pager [phone] (8a-5p)

## 2021-02-05 NOTE — Telephone Encounter (Signed)
Patient Advocate Encounter   Received notification from [employer] that prior authorization for Sildenafil is required.   PA submitted on CoverMyMeds Key [id number]  Status is pending   Will continue to follow.

## 2021-02-06 ENCOUNTER — Telehealth: Payer: Self-pay | Admitting: Internal Medicine

## 2021-02-06 ENCOUNTER — Other Ambulatory Visit: Payer: Self-pay

## 2021-02-06 DIAGNOSIS — J9602 Acute respiratory failure with hypercapnia: Secondary | ICD-10-CM | POA: Diagnosis not present

## 2021-02-06 DIAGNOSIS — J9601 Acute respiratory failure with hypoxia: Secondary | ICD-10-CM | POA: Diagnosis not present

## 2021-02-06 DIAGNOSIS — G4733 Obstructive sleep apnea (adult) (pediatric): Secondary | ICD-10-CM | POA: Diagnosis not present

## 2021-02-06 DIAGNOSIS — M6281 Muscle weakness (generalized): Secondary | ICD-10-CM | POA: Diagnosis not present

## 2021-02-06 DIAGNOSIS — I5033 Acute on chronic diastolic (congestive) heart failure: Secondary | ICD-10-CM | POA: Diagnosis not present

## 2021-02-06 LAB — POCT I-STAT EG7
Acid-Base Excess: 13 mmol/L — ABNORMAL HIGH (ref 0.0–2.0)
Bicarbonate: 43.8 mmol/L — ABNORMAL HIGH (ref 20.0–28.0)
Calcium, Ion: 1.2 mmol/L (ref 1.15–1.40)
HCT: 50 % — ABNORMAL HIGH (ref 36.0–46.0)
Hemoglobin: 17 g/dL — ABNORMAL HIGH (ref 12.0–15.0)
O2 Saturation: 64 %
Potassium: 3.6 mmol/L (ref 3.5–5.1)
Sodium: 141 mmol/L (ref 135–145)
TCO2: 46 mmol/L — ABNORMAL HIGH (ref 22–32)
pCO2, Ven: 79.1 mmHg (ref 44.0–60.0)
pH, Ven: 7.351 (ref 7.250–7.430)
pO2, Ven: 37 mmHg (ref 32.0–45.0)

## 2021-02-06 NOTE — Telephone Encounter (Signed)
Patient called and was wondering if an order could be placed for a connector piece for her CPAP machine. Please advise.

## 2021-02-06 NOTE — Telephone Encounter (Signed)
Patient has been advised to contact [employer] regarding when they would be bringing her CPAP machine and parts. She was just released from the hospital.

## 2021-02-09 DIAGNOSIS — M6281 Muscle weakness (generalized): Secondary | ICD-10-CM | POA: Diagnosis not present

## 2021-02-09 DIAGNOSIS — J9601 Acute respiratory failure with hypoxia: Secondary | ICD-10-CM | POA: Diagnosis not present

## 2021-02-09 DIAGNOSIS — R262 Difficulty in walking, not elsewhere classified: Secondary | ICD-10-CM | POA: Diagnosis not present

## 2021-02-09 DIAGNOSIS — J449 Chronic obstructive pulmonary disease, unspecified: Secondary | ICD-10-CM | POA: Diagnosis not present

## 2021-02-09 NOTE — Telephone Encounter (Signed)
Advanced Heart Failure Patient Advocate Encounter  Prior Authorization for Sildenafil has been approved.    PA# [id number] Effective dates: 02/05/21 through 12/26/21  Charlann Boxer, CPhT

## 2021-02-10 ENCOUNTER — Ambulatory Visit: Payer: Medicare Other | Admitting: Endocrinology

## 2021-02-10 DIAGNOSIS — R262 Difficulty in walking, not elsewhere classified: Secondary | ICD-10-CM | POA: Diagnosis not present

## 2021-02-10 DIAGNOSIS — J449 Chronic obstructive pulmonary disease, unspecified: Secondary | ICD-10-CM | POA: Diagnosis not present

## 2021-02-10 DIAGNOSIS — M6281 Muscle weakness (generalized): Secondary | ICD-10-CM | POA: Diagnosis not present

## 2021-02-12 DIAGNOSIS — M6281 Muscle weakness (generalized): Secondary | ICD-10-CM | POA: Diagnosis not present

## 2021-02-12 DIAGNOSIS — J9601 Acute respiratory failure with hypoxia: Secondary | ICD-10-CM | POA: Diagnosis not present

## 2021-02-13 ENCOUNTER — Ambulatory Visit: Payer: Self-pay | Admitting: *Deleted

## 2021-02-13 DIAGNOSIS — M6281 Muscle weakness (generalized): Secondary | ICD-10-CM | POA: Diagnosis not present

## 2021-02-13 DIAGNOSIS — J9601 Acute respiratory failure with hypoxia: Secondary | ICD-10-CM | POA: Diagnosis not present

## 2021-02-13 DIAGNOSIS — R262 Difficulty in walking, not elsewhere classified: Secondary | ICD-10-CM | POA: Diagnosis not present

## 2021-02-13 DIAGNOSIS — J449 Chronic obstructive pulmonary disease, unspecified: Secondary | ICD-10-CM | POA: Diagnosis not present

## 2021-02-13 DIAGNOSIS — G4733 Obstructive sleep apnea (adult) (pediatric): Secondary | ICD-10-CM | POA: Diagnosis not present

## 2021-02-13 NOTE — Patient Instructions (Signed)
Visit Information  As a part of your Medicare benefit, you are eligible for care management and care coordination services. I was unable to reach you by phone today but would like to speak with you about your health related needs. I have scheduled a follow up phone call on Tuesday February 17, 2021 at 9:15 am; please listen out for my call.    Feel free to contact me at [phone] if I can be of assistance to you.  [name], RN, BSN, [facility] RN Care Coordination- [place] [phone]

## 2021-02-13 NOTE — Chronic Care Management (AMB) (Signed)
  Chronic Care Management   Outreach Note  02/13/2021 Name: [name] MRN: [id number] DOB: [birth year]  Referred by: [name], MD Reason for referral : Chronic Care Management (CHF, DM)  An unsuccessful telephone outreach was attempted today. The patient was referred to the case management team for assistance with care management and care coordination.   Follow Up Plan: A HIPAA compliant phone message was left for the patient providing contact information and requesting a return call.  The care management team will reach out to the patient again over the next 7 days.   [name], RN, BSN, [facility] RN Care Coordination- [place] [phone]

## 2021-02-14 DIAGNOSIS — G4733 Obstructive sleep apnea (adult) (pediatric): Secondary | ICD-10-CM | POA: Diagnosis not present

## 2021-02-15 NOTE — Progress Notes (Signed)
Subjective:    Patient ID: [name], female    DOB: Jan 12, 1931, 87 y.o.   MRN: [id number]  HPI The patient is here for follow up from the hospital.    Admitted 01/31/21  -  02/05/21  Discharged with home PT/OT DME O 2  2-3 L  She went to the ED for SOB/DOE and leg edema.  She had progressive worsening of her symptoms over the past 1-2 months.  The few days prior to going to the ED she had orthopnea and PND.  EMS noted O2 sat 78% on RA.  Cxr, ctpa no infiltrate, no PE. She had some interstitial edema and b/l small effusions.  BNP 171.  Received lasix IV in ED.  BiPAP started. Cardio consulted, had R heart cath - found to have severe pulm arterial htn. Received IV lasix.  Started on sildenafil.  Started on supplemental O2.     Recommendations for Outpatient Follow-up:  1. Follow up with PCP in 1-2 weeks 2. Follow up with [facility] Dr. Haroldine Laws within 1-2 weeks 3. Follow up with Dr. Halford Chessman within 1-2 weeks  4. Repeat CXR in 3-6 weeks  5. Please obtain CMP/CBC, Mag, Phos in one week    Oxygen has been 90-100% at home on 3 L.  She wonders if she can decrease to 2 L.  Her oxygen is better when she is more active.     Sugars < 130.  Her insulin was cut in half.  She has changed her diet.    Her balance and endurance is worse.  She has PT 5/week and OT 3/week.  Weight has been stable since leaving the hospital.  She denies SOB.  Her leg are more swollen since d/c.  She tries to elevate her legs when sitting.      Medications and allergies reviewed with patient and updated if appropriate.  Patient Active Problem List   Diagnosis Date Noted  . Acute respiratory failure with hypoxia and hypercapnia ([place]) 01/31/2021  . Acute on chronic diastolic CHF (congestive heart failure) ([place]) 01/31/2021  . Elevated troponin 01/31/2021  . Type 2 diabetes mellitus with hyperlipidemia ([place]) 01/31/2021  . Neuropathy 08/19/2020  . PAD (peripheral artery disease) ([place]) 02/29/2020   . Chronic kidney disease (CKD) 02/20/2020  . Osteoarthritis 08/01/2019  . Arthritis of left sacroiliac joint 05/04/2017  . Osteopenia 02/17/2017  . Piriformis syndrome of left side 02/16/2017  . Acquired leg length discrepancy 02/16/2017  . COPD GOLD I  01/27/2017  . Lumbar radiculopathy, acute 01/27/2017  . Granuloma annulare 07/27/2016  . Numbness of fingers 07/27/2016  . Primary osteoarthritis of right knee 06/27/2016  . Rash and nonspecific skin eruption 06/09/2016  . Diabetes ([place]) 03/07/2016  . OSA  08/28/2014  . Chronic diastolic heart failure ([place]) 07/30/2014  . Nocturnal hypoxemia 07/09/2014  . SOB (shortness of breath) 05/15/2014  . Pulmonary HTN ([place]) 05/15/2014  . Band keratopathy 01/16/2014  . 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 Hyperthyroidism 11/04/2011  . Multinodular goiter 10/14/2011  . Atrophy, Fuchs' 09/29/2011  . ATRIAL FIBRILLATION 06/03/2010  . VITILIGO 11/07/2009  . SINOATRIAL NODE DYSFUNCTION 06/16/2009  . Atrial flutter ([place]) 06/25/2008  . HYPERLIPIDEMIA 01/29/2008  . Coronary atherosclerosis 01/29/2008  . HTN (hypertension) 10/30/2007    Current Outpatient Medications on File Prior to Visit  Medication Sig Dispense Refill  . acetaminophen (TYLENOL) 325 MG tablet Take 2 tablets (650 mg total) by mouth every 6 (six) hours as needed for mild  pain or headache. 20 tablet 0  . amLODipine (NORVASC) 10 MG tablet Take 1 tablet (10 mg total) by mouth daily. 30 tablet 3  . carvedilol (COREG) 6.25 MG tablet TAKE 1 TABLET BY MOUTH  TWICE DAILY WITH MEALS (Patient taking differently: Take 6.25 mg by mouth 2 (two) times daily with a meal.) 180 tablet 3  . cholecalciferol (VITAMIN D) 1000 units tablet Take 1,000 Units by mouth daily after supper.    . clopidogrel (PLAVIX) 75 MG tablet TAKE 1 TABLET BY MOUTH ONCE DAILY WITH BREAKFAST (Patient taking differently: Take 75 mg by mouth daily.) 90 tablet 3  . ELIQUIS 5 MG TABS tablet TAKE 1 TABLET BY MOUTH  TWICE  DAILY (Patient taking differently: Take 5 mg by mouth 2 (two) times daily.) 60 tablet 11  . ezetimibe (ZETIA) 10 MG tablet Take 1 tablet (10 mg total) by mouth daily. 30 tablet 10  . furosemide (LASIX) 80 MG tablet TAKE 1 TABLET BY MOUTH  TWICE DAILY (Patient taking differently: Take 80 mg by mouth 2 (two) times daily.) 180 tablet 3  . glucose blood (ONETOUCH VERIO) test strip USE TO MONITOR GLUCOSE  LEVELS TWICE DAILY 200 strip 3  . Insulin Lispro Prot & Lispro (HUMALOG MIX 75/25 KWIKPEN) (75-25) 100 UNIT/ML Kwikpen Inject 18 units into the skin daily with breakfast and dinner; Have Endocrinologist adjust accordingly 15 mL 11  . Lancets (ONETOUCH DELICA PLUS [id number]) MISC USE TO MONITOR GLUCOSE  LEVELS TWICE DAILY 200 each 3  . Multiple Vitamin (MULTIVITAMIN WITH MINERALS) TABS tablet Take 1 tablet by mouth daily. Centrum Silver    . nitroGLYCERIN (NITROSTAT) 0.4 MG SL tablet Place 1 tablet (0.4 mg total) under the tongue every 5 (five) minutes as needed for chest pain. Reported on 01/28/2016 30 tablet 3  . potassium chloride SA (KLOR-CON) 20 MEQ tablet Take 20 mEq by mouth 2 (two) times daily.    . sildenafil (REVATIO) 20 MG tablet Take 1 tablet (20 mg total) by mouth 3 (three) times daily. 90 tablet 0  . CALCIUM-MAG-VIT C-VIT D PO Take 1 tablet by mouth daily.  (Patient not taking: Reported on 02/16/2021)    . Evolocumab (REPATHA SURECLICK) 606 MG/ML SOAJ Inject 140 mg into the skin every 14 (fourteen) days. (Patient not taking: No sig reported)    . NOVOFINE PLUS PEN NEEDLE 32G X 4 MM MISC SMARTSIG:1 Each SUB-Q Daily     No current facility-administered medications on file prior to visit.    Past Medical History:  Diagnosis Date  . Anemia    iron defficiency  . Anginal pain ([place])   . Arthritis    "knees, feet, hands; joints" (05/27/2015)  . Asthma   . Atrial fibrillation or flutter    s/p RFCA 7/08;   s/p DCCV in past;   previously on amiodarone;  amio stopped due to lung toxicity  . CAD  (coronary artery disease)    s/p NSTEMI tx with BMS to OM1 3/08;  cath 3/08: pOM 99% tx with PCI, pLAD 20%, ? mod stenosis at the AM  . CAP (community acquired pneumonia) 05/24/2015  . CHF (congestive heart failure) ([place])   . Chronic diastolic heart failure (H[facility])    echo 11/11:  EF 55-60%, severe LVH, mod LAE, mild MR, mildly increased PASP  . COPD (chronic obstructive pulmonary disease) ([place])   . Degenerative joint disease   . Dystrophy, corneal stromal   . Heart murmur   . HLD (hyperlipidemia)   . HTN (  hypertension)    essential nos  . Hypopotassemia    PMH of  . Muscle pain   . Myocardial infarction ([place]) 2008  . OSA on CPAP   . Osteoporosis   . Pneumonia 05/27/2015  . Protein calorie malnutrition ([place])   . Rash and nonspecific skin eruption    both arms,awaiting bio   dr Delman Cheadle  . Seasonal allergies   . Shortness of breath dyspnea   . Type II diabetes mellitus ([place])    Dr Loanne Drilling    Past Surgical History:  Procedure Laterality Date  . A FLUTTER ABLATION     Dr Lovena Le  . ABDOMINAL AORTOGRAM W/LOWER EXTREMITY Bilateral 04/11/2020   Procedure: ABDOMINAL AORTOGRAM W/LOWER EXTREMITY;  Surgeon: Angelia Mould, MD;  Location: [facility];  Service: Cardiovascular;  Laterality: Bilateral;  . CATARACT EXTRACTION W/ INTRAOCULAR LENS  IMPLANT, BILATERAL Bilateral   . CHOLECYSTECTOMY    . COLONOSCOPY  6/07   2 polyps Dr. Kinnie Feil in [place]  . colonoscopy with polypectomy      X 2; [facility]  . CORNEAL TRANSPLANT Bilateral   . CORONARY ANGIOPLASTY WITH STENT PLACEMENT  02/2007   BMS w/Dr Lovena Le  . CORONARY ATHERECTOMY N/A 05/16/2020   Procedure: CORONARY ATHERECTOMY;  Surgeon: Martinique, Peter M, MD;  Location: [facility];  Service: Cardiovascular;  Laterality: N/A;  . CORONARY BALLOON ANGIOPLASTY N/A 05/16/2020   Procedure: CORONARY BALLOON ANGIOPLASTY;  Surgeon: Martinique, Peter M, MD;  Location: [facility];  Service: Cardiovascular;  Laterality: N/A;  . CORONARY  STENT INTERVENTION N/A 05/16/2020   Procedure: CORONARY STENT INTERVENTION;  Surgeon: Martinique, Peter M, MD;  Location: [facility];  Service: Cardiovascular;  Laterality: N/A;  . EYE SURGERY    . gravid 2 para 2    . INTRAVASCULAR ULTRASOUND/IVUS N/A 05/16/2020   Procedure: Intravascular Ultrasound/IVUS;  Surgeon: Martinique, Peter M, MD;  Location: [facility];  Service: Cardiovascular;  Laterality: N/A;  . KNEE CARTILAGE SURGERY Right   . LEFT AND RIGHT HEART CATHETERIZATION WITH CORONARY ANGIOGRAM N/A 05/07/2014   Procedure: LEFT AND RIGHT HEART CATHETERIZATION WITH CORONARY ANGIOGRAM;  Surgeon: Peter M Martinique, MD;  Location: [facility];  Service: Cardiovascular;  Laterality: N/A;  . PERIPHERAL VASCULAR INTERVENTION  04/11/2020   Procedure: PERIPHERAL VASCULAR INTERVENTION;  Surgeon: Angelia Mould, MD;  Location: [facility];  Service: Cardiovascular;;  . RIGHT HEART CATH N/A 02/04/2021   Procedure: RIGHT HEART CATH;  Surgeon: Jolaine Artist, MD;  Location: [facility];  Service: Cardiovascular;  Laterality: N/A;  . RIGHT/LEFT HEART CATH AND CORONARY ANGIOGRAPHY N/A 05/16/2020   Procedure: RIGHT/LEFT HEART CATH AND CORONARY ANGIOGRAPHY;  Surgeon: Jolaine Artist, MD;  Location: [facility];  Service: Cardiovascular;  Laterality: N/A;  . TOTAL KNEE ARTHROPLASTY Right 06/28/2016   Procedure: TOTAL KNEE ARTHROPLASTY;  Surgeon: Frederik Pear, MD;  Location: [facility];  Service: Orthopedics;  Laterality: Right;    Social History   Socioeconomic History  . Marital status: Widowed    Spouse name: Not on file  . Number of children: 2  . Years of education: 16  . Highest education level: Bachelor's degree (e.g., BA, AB, BS)  Occupational History  . Occupation: Recruitment consultant Retired  Tobacco Use  . Smoking status: Former Smoker    Packs/day: 0.25    Years: 18.00    Pack years: 4.50    Types: Cigarettes    Quit date: 12/27/1968  Years since quitting: 52.1  .  Smokeless tobacco: Never Used  . Tobacco comment: smoked [place], up to 1 pp week  Vaping Use  . Vaping Use: Never used  Substance and Sexual Activity  . Alcohol use: Yes    Comment: occ  . Drug use: No  . Sexual activity: Never  Other Topics Concern  . Not on file  Social History Narrative   Teacher. Widowed. Rarely drinks cafeine.    Social Determinants of Health   Financial Resource Strain: Medium Risk  . Difficulty of Paying Living Expenses: Somewhat hard  Food Insecurity: Not on file  Transportation Needs: Not on file  Physical Activity: Not on file  Stress: Not on file  Social Connections: Not on file    Family History  Problem Relation Age of Onset  . Diabetes Mother   . Hypertension Mother   . Transient ischemic attack Mother   . Arthritis Mother   . Heart attack Father 54  . Arthritis Father   . Hypertension Father   . Breast cancer Maternal Aunt   . Arthritis Maternal Aunt     Review of Systems  Constitutional: Negative for appetite change, chills and fever.  Respiratory: Negative for cough, shortness of breath and wheezing.   Cardiovascular: Positive for leg swelling (has gotten since d/c). Negative for chest pain and palpitations (occ heart racing).  Gastrointestinal: Negative for abdominal pain, constipation, diarrhea and nausea.  Genitourinary: Negative for dysuria.  Neurological: Negative for light-headedness and headaches.       Objective:   Vitals:   02/16/21 1511  BP: 130/70  Pulse: 72  Temp: 98.8 F (37.1 C)  SpO2: 99%   BP Readings from Last 3 Encounters:  02/16/21 130/70  02/05/21 (!) 154/58  12/18/20 (!) 160/88   Wt Readings from Last 3 Encounters:  02/16/21 166 lb (75.3 kg)  02/02/21 167 lb 6.4 oz (75.9 kg)  12/18/20 163 lb 6.4 oz (74.1 kg)   Body mass index is 28.49 kg/m.   Physical Exam    Constitutional: Appears well-developed and well-nourished. No distress.  HENT:  Head: Normocephalic and atraumatic.  Neck:  Neck supple. No tracheal deviation present. No thyromegaly present.  No cervical lymphadenopathy Cardiovascular: Normal rate, regular rhythm and normal heart sounds.   2/6 systolic murmur heard. No carotid bruit .  2 + pitting edema b/l feet, 1 + pitting edema b/l LEs Pulmonary/Chest: Effort normal and breath sounds normal. No respiratory distress. No has no wheezes. No rales.  Abdomen; soft, NT, ND Skin: Skin is warm and dry. Not diaphoretic.  Psychiatric: Normal mood and affect. Behavior is normal.   DG CHEST PORT 1 VIEW CLINICAL DATA:  Shortness of breath  EXAM: PORTABLE CHEST 1 VIEW  COMPARISON:  01/30/2021  FINDINGS: Cardiac shadow is at the upper limits of normal in size. Aortic calcifications are again seen. Lungs are well aerated bilaterally. No focal infiltrate or sizable effusion is noted. No bony abnormality is seen.  IMPRESSION: No active disease.  Electronically Signed   By: Inez Catalina M.D.   On: 02/05/2021 08:05     Lab Results  Component Value Date   WBC 5.1 02/05/2021   HGB 14.7 02/05/2021   HCT 46.5 (H) 02/05/2021   PLT 156 02/05/2021   GLUCOSE 183 (H) 02/05/2021   CHOL 164 05/16/2020   TRIG 90 05/16/2020   HDL 50 05/16/2020   LDLDIRECT 144.6 02/03/2010   LDLCALC 96 05/16/2020   ALT 18 02/05/2021   AST  20 02/05/2021   NA 139 02/05/2021   K 3.7 02/05/2021   CL 92 (L) 02/05/2021   CREATININE 0.84 02/05/2021   BUN 16 02/05/2021   CO2 37 (H) 02/05/2021   TSH 0.866 05/15/2020   INR 1.16 06/28/2016   HGBA1C 9.5 (A) 12/02/2020   MICROALBUR 11.9 (H) 07/31/2018      Assessment & Plan:    I spent 30 minutes dedicated to the care of this patient on the date of this encounter including review of recent labs, imaging and procedures, speciality notes, obtaining history, communicating with the patient, ordering medications, tests, and documenting clinical information in the EHR   See Problem List for Assessment and Plan of chronic medical  problems.    This visit occurred during the SARS-CoV-2 public health emergency.  Safety protocols were in place, including screening questions prior to the visit, additional usage of staff PPE, and extensive cleaning of exam room while observing appropriate contact time as indicated for disinfecting solutions.

## 2021-02-15 NOTE — Patient Instructions (Addendum)
    Medications changes include :   none    

## 2021-02-16 ENCOUNTER — Ambulatory Visit (INDEPENDENT_AMBULATORY_CARE_PROVIDER_SITE_OTHER): Payer: Medicare Other | Admitting: Internal Medicine

## 2021-02-16 ENCOUNTER — Other Ambulatory Visit: Payer: Self-pay

## 2021-02-16 ENCOUNTER — Encounter: Payer: Self-pay | Admitting: Internal Medicine

## 2021-02-16 ENCOUNTER — Ambulatory Visit (INDEPENDENT_AMBULATORY_CARE_PROVIDER_SITE_OTHER): Payer: Medicare Other | Admitting: Pharmacist

## 2021-02-16 DIAGNOSIS — Z794 Long term (current) use of insulin: Secondary | ICD-10-CM | POA: Diagnosis not present

## 2021-02-16 DIAGNOSIS — R262 Difficulty in walking, not elsewhere classified: Secondary | ICD-10-CM | POA: Diagnosis not present

## 2021-02-16 DIAGNOSIS — I5032 Chronic diastolic (congestive) heart failure: Secondary | ICD-10-CM

## 2021-02-16 DIAGNOSIS — I10 Essential (primary) hypertension: Secondary | ICD-10-CM

## 2021-02-16 DIAGNOSIS — E1122 Type 2 diabetes mellitus with diabetic chronic kidney disease: Secondary | ICD-10-CM

## 2021-02-16 DIAGNOSIS — N1831 Chronic kidney disease, stage 3a: Secondary | ICD-10-CM

## 2021-02-16 DIAGNOSIS — I272 Pulmonary hypertension, unspecified: Secondary | ICD-10-CM | POA: Diagnosis not present

## 2021-02-16 DIAGNOSIS — I25119 Atherosclerotic heart disease of native coronary artery with unspecified angina pectoris: Secondary | ICD-10-CM

## 2021-02-16 DIAGNOSIS — M6281 Muscle weakness (generalized): Secondary | ICD-10-CM | POA: Diagnosis not present

## 2021-02-16 DIAGNOSIS — E782 Mixed hyperlipidemia: Secondary | ICD-10-CM | POA: Diagnosis not present

## 2021-02-16 DIAGNOSIS — J9601 Acute respiratory failure with hypoxia: Secondary | ICD-10-CM

## 2021-02-16 DIAGNOSIS — I5043 Acute on chronic combined systolic (congestive) and diastolic (congestive) heart failure: Secondary | ICD-10-CM

## 2021-02-16 DIAGNOSIS — M8580 Other specified disorders of bone density and structure, unspecified site: Secondary | ICD-10-CM

## 2021-02-16 DIAGNOSIS — J9602 Acute respiratory failure with hypercapnia: Secondary | ICD-10-CM

## 2021-02-16 DIAGNOSIS — J449 Chronic obstructive pulmonary disease, unspecified: Secondary | ICD-10-CM | POA: Diagnosis not present

## 2021-02-16 NOTE — Progress Notes (Signed)
Advanced [facility] Failure Clinic Note   Date:  02/16/2021   ID:  [name], DOB 02-Aug-1931, MRN [id number]  Location: Home  Provider location: [facility] Type of Visit: Established patient  PCP:  [name], MD  Cardiologist:  Pixie Casino, MD Primary HF: [name]  Chief Complaint: Heart Failure follow-up   History of Present Illness:  [name] is a 87 y.o. female with CAD s/p 1v stent, HTN, atrial fibrillation/A flutter and chronic prior systolic heart failure, (which was likely rate related) and diastolic heart failure. She was referred by Dr. Lovena Le for further evaluation of Pulmonary HTN.   Underwent 2D echo in 3/15 which showed EF 60-65% mild LVH. RV was normal. No significant valvular abnormalities.   Underwent R/L cath 05/07/14 Which showed stable CAD and significant PH with normal PVR.   In 4/21 saw Dr. Scot Dock and had LE angio with severe PAD underwent stenting of 70% R external iliac  Admitted 05/21/17-05/27/17 with COPD exacerbation, acute on chronic diastolic CHF. Diuresed 5L with IV Lasix. Discharge weight 173 pounds.  Myoview 9/20   Nuclear stress EF: 56%.  There was no ST segment deviation noted during stress.  Defect 1: There is a medium defect of moderate severity present in the mid inferolateral, apical inferior and apical lateral location.  Findings consistent with possible mild ischemia in the apical inferior and inferolateral regions.  The left ventricular ejection fraction is normal (55-65%).    Admitted in 5/21 with NSTEMI hstrop peak 1,455. Echo 5/21: EF 50-55%  Cath 05/16/20 with  1. Severe 3 vessel obstructive CAD 2. Severe pulmonary HTN 3. Successful PCI of the proximal LCx/OM1 with DES x 1 with IVUS guidance 4. Successful PCI of the proximal LAD with orbital atherectomy and DES x 1  RA = 6 RV = 75/6 PA = 77/26 (45) PCW = 10 Fick cardiac output/index = 4.0/2.2 PVR = 8.8 WU FA sat =  99% PA sat = 67%, 73%  Admitted 2/22 with volume overload. Diuresed. Repeat RHC on 02/04/21 as below. Diuresed and started on sildenafil. D/c weight 167  RA = 6 RV = 88/5 PA =  86/33 (53) PCW = 21 Fick cardiac output/index = 3.4/1.9 PVR = 9.4 WU Ao sat = 97% PA sat = 64%, 64% PAPi = 8.8  Here with her daughter for f/u. Says she feels ok but is overwhelmed by all the stuff that has been going on.Says she has lived a good life and would be happy if she wasn't around much longer. Doesn't like wearing O2. Ankles still swelling. Getting flushing with sildenafil. Still SOB with mild activity. BPs at home mostly in 120 range      Past Medical History:  Diagnosis Date  . Anemia    iron defficiency  . Anginal pain ([place])   . Arthritis    "knees, feet, hands; joints" (05/27/2015)  . Asthma   . Atrial fibrillation or flutter    s/p RFCA 7/08;   s/p DCCV in past;   previously on amiodarone;  amio stopped due to lung toxicity  . CAD (coronary artery disease)    s/p NSTEMI tx with BMS to OM1 3/08;  cath 3/08: pOM 99% tx with PCI, pLAD 20%, ? mod stenosis at the AM  . CAP (community acquired pneumonia) 05/24/2015  . CHF (congestive heart failure) ([place])   . Chronic diastolic heart failure (HCC)    echo 11/11:  EF 55-60%, severe  LVH, mod LAE, mild MR, mildly increased PASP  . COPD (chronic obstructive pulmonary disease) ([place])   . Degenerative joint disease   . Dystrophy, corneal stromal   . Heart murmur   . HLD (hyperlipidemia)   . HTN (hypertension)    essential nos  . Hypopotassemia    PMH of  . Muscle pain   . Myocardial infarction ([place]) 2008  . OSA on CPAP   . Osteoporosis   . Pneumonia 05/27/2015  . Protein calorie malnutrition ([place])   . Rash and nonspecific skin eruption    both arms,awaiting bio   dr Delman Cheadle  . Seasonal allergies   . Shortness of breath dyspnea   . Type II diabetes mellitus ([place])    Dr Loanne Drilling   Past Surgical History:  Procedure Laterality Date  . A FLUTTER  ABLATION     Dr Lovena Le  . ABDOMINAL AORTOGRAM W/LOWER EXTREMITY Bilateral 04/11/2020   Procedure: ABDOMINAL AORTOGRAM W/LOWER EXTREMITY;  Surgeon: Angelia Mould, MD;  Location: [facility];  Service: Cardiovascular;  Laterality: Bilateral;  . CATARACT EXTRACTION W/ INTRAOCULAR LENS  IMPLANT, BILATERAL Bilateral   . CHOLECYSTECTOMY    . COLONOSCOPY  6/07   2 polyps Dr. Kinnie Feil in [place]  . colonoscopy with polypectomy      X 2; [facility]  . CORNEAL TRANSPLANT Bilateral   . CORONARY ANGIOPLASTY WITH STENT PLACEMENT  02/2007   BMS w/Dr Lovena Le  . CORONARY ATHERECTOMY N/A 05/16/2020   Procedure: CORONARY ATHERECTOMY;  Surgeon: Martinique, Peter M, MD;  Location: [facility];  Service: Cardiovascular;  Laterality: N/A;  . CORONARY BALLOON ANGIOPLASTY N/A 05/16/2020   Procedure: CORONARY BALLOON ANGIOPLASTY;  Surgeon: Martinique, Peter M, MD;  Location: [facility];  Service: Cardiovascular;  Laterality: N/A;  . CORONARY STENT INTERVENTION N/A 05/16/2020   Procedure: CORONARY STENT INTERVENTION;  Surgeon: Martinique, Peter M, MD;  Location: [facility];  Service: Cardiovascular;  Laterality: N/A;  . EYE SURGERY    . gravid 2 para 2    . INTRAVASCULAR ULTRASOUND/IVUS N/A 05/16/2020   Procedure: Intravascular Ultrasound/IVUS;  Surgeon: Martinique, Peter M, MD;  Location: [facility];  Service: Cardiovascular;  Laterality: N/A;  . KNEE CARTILAGE SURGERY Right   . LEFT AND RIGHT HEART CATHETERIZATION WITH CORONARY ANGIOGRAM N/A 05/07/2014   Procedure: LEFT AND RIGHT HEART CATHETERIZATION WITH CORONARY ANGIOGRAM;  Surgeon: Peter M Martinique, MD;  Location: [facility] CATH LAB;  Service: Cardiovascular;  Laterality: N/A;  . PERIPHERAL VASCULAR INTERVENTION  04/11/2020   Procedure: PERIPHERAL VASCULAR INTERVENTION;  Surgeon: Angelia Mould, MD;  Location: [facility];  Service: Cardiovascular;;  . RIGHT HEART CATH N/A 02/04/2021   Procedure: RIGHT HEART CATH;  Surgeon: Jolaine Artist, MD;  Location: [facility];  Service: Cardiovascular;  Laterality: N/A;  . RIGHT/LEFT HEART CATH AND CORONARY ANGIOGRAPHY N/A 05/16/2020   Procedure: RIGHT/LEFT HEART CATH AND CORONARY ANGIOGRAPHY;  Surgeon: Jolaine Artist, MD;  Location: [facility];  Service: Cardiovascular;  Laterality: N/A;  . TOTAL KNEE ARTHROPLASTY Right 06/28/2016   Procedure: TOTAL KNEE ARTHROPLASTY;  Surgeon: Frederik Pear, MD;  Location: [facility];  Service: Orthopedics;  Laterality: Right;     Current Outpatient Medications  Medication Sig Dispense Refill  . acetaminophen (TYLENOL) 325 MG tablet Take 2 tablets (650 mg total) by mouth every 6 (six) hours as needed for mild pain or headache. 20 tablet 0  . amLODipine (NORVASC) 10 MG  tablet Take 1 tablet (10 mg total) by mouth daily. 30 tablet 3  . carvedilol (COREG) 6.25 MG tablet TAKE 1 TABLET BY MOUTH  TWICE DAILY WITH MEALS (Patient taking differently: Take 6.25 mg by mouth 2 (two) times daily with a meal.) 180 tablet 3  . cholecalciferol (VITAMIN D) 1000 units tablet Take 1,000 Units by mouth daily after supper.    . clopidogrel (PLAVIX) 75 MG tablet TAKE 1 TABLET BY MOUTH ONCE DAILY WITH BREAKFAST (Patient taking differently: Take 75 mg by mouth daily.) 90 tablet 3  . ELIQUIS 5 MG TABS tablet TAKE 1 TABLET BY MOUTH  TWICE DAILY (Patient taking differently: Take 5 mg by mouth 2 (two) times daily.) 60 tablet 11  . Evolocumab (REPATHA SURECLICK) 417 MG/ML SOAJ Inject 140 mg into the skin every 14 (fourteen) days. (Patient not taking: No sig reported)    . ezetimibe (ZETIA) 10 MG tablet Take 1 tablet (10 mg total) by mouth daily. 30 tablet 10  . furosemide (LASIX) 80 MG tablet TAKE 1 TABLET BY MOUTH  TWICE DAILY (Patient taking differently: Take 80 mg by mouth 2 (two) times daily.) 180 tablet 3  . glucose blood (ONETOUCH VERIO) test strip USE TO MONITOR GLUCOSE  LEVELS TWICE DAILY 200 strip 3  . Insulin Lispro Prot & Lispro (HUMALOG MIX 75/25  KWIKPEN) (75-25) 100 UNIT/ML Kwikpen Inject 18 units into the skin daily with breakfast and dinner; Have Endocrinologist adjust accordingly 15 mL 11  . Lancets (ONETOUCH DELICA PLUS [id number]) MISC USE TO MONITOR GLUCOSE  LEVELS TWICE DAILY 200 each 3  . Multiple Vitamin (MULTIVITAMIN WITH MINERALS) TABS tablet Take 1 tablet by mouth daily. Centrum Silver    . nitroGLYCERIN (NITROSTAT) 0.4 MG SL tablet Place 1 tablet (0.4 mg total) under the tongue every 5 (five) minutes as needed for chest pain. Reported on 01/28/2016 30 tablet 3  . NOVOFINE PLUS PEN NEEDLE 32G X 4 MM MISC SMARTSIG:1 Each SUB-Q Daily    . potassium chloride SA (KLOR-CON) 20 MEQ tablet Take 20 mEq by mouth 2 (two) times daily.    . sildenafil (REVATIO) 20 MG tablet Take 1 tablet (20 mg total) by mouth 3 (three) times daily. 90 tablet 0   No current facility-administered medications for this encounter.    Allergies:   Levaquin [levofloxacin], Lobster [shellfish allergy], Penicillins, Valsartan, Codeine, Lipitor [atorvastatin], Oxycodone, Statins, Tramadol, Amlodipine besylate, Crestor [rosuvastatin calcium], and Tramadol hcl   Social History:  The patient  reports that she quit smoking about 52 years ago. Her smoking use included cigarettes. She has a 4.50 pack-year smoking history. She has never used smokeless tobacco. She reports current alcohol use. She reports that she does not use drugs.   Family History:  The patient's family history includes Arthritis in her father, maternal aunt, and mother; Breast cancer in her maternal aunt; Diabetes in her mother; Heart attack (age of onset: 29) in her father; Hypertension in her father and mother; Transient ischemic attack in her mother.   ROS:  Please see the history of present illness.   All other systems are personally reviewed and negative.   Vitals:   02/19/21 1049  BP: (!) 160/80  Pulse: 81  SpO2: 99%  Weight: 76 kg (167 lb 9.6 oz)    Exam:   General:  Looks younger than  stated age. No resp difficulty HEENT: normal Neck: supple. JVP 7-8. Carotids 2+ bilat; no bruits. No lymphadenopathy or thryomegaly appreciated. Cor: PMI nondisplaced. Irregular rate & rhythm.  No rubs, gallops or murmurs. Lungs: clear Abdomen: soft, nontender, nondistended. No hepatosplenomegaly. No bruits or masses. Good bowel sounds. Extremities: no cyanosis, clubbing, rash, trace edema Neuro: alert & orientedx3, cranial nerves grossly intact. moves all 4 extremities w/o difficulty. Affect pleasant  Recent Labs: 05/15/2020: TSH 0.866 01/30/2021: B Natriuretic Peptide 171.8 02/05/2021: ALT 18; BUN 16; Creatinine, Ser 0.84; Hemoglobin 14.7; Magnesium 1.8; Platelets 156; Potassium 3.7; Sodium 139  Personally reviewed   Wt Readings from Last 3 Encounters:  02/19/21 76 kg (167 lb 9.6 oz)  02/16/21 75.3 kg (166 lb)  02/02/21 75.9 kg (167 lb 6.4 oz)     ASSESSMENT AND PLAN:   1) Chronic diastolic CHF:  - Echo 07/3150 EF 60-65%, LA severely dilated, Echo 10/2017: EF 65-70%, grade 3 DD - Echo 5/21 EF 50-55% - Echo 2/22 EF 40-45% - NYHA III  - Volume status improved but still seems to have some mild edema. Weight stable from hospital d/c - Switch lasix 80 bid to torsemide 40 bid - Not on Entresto due to h/o angioedema with ARB - Not interested in further med titration.  - Place gentle compression hose - check labs  2) CAD - H/o of stent in 2008 - NSTEMI 5/21. Cath as above. S/p PCI to LAD & OM - No s/s angina - On Plavix, apixaban - Unable to tolerate statins. On Repatha/zetia per Lipid Clinic.  3) PAD - severe  - s/p stenting to R external iliac with Dr. Scot Dock - Continues with mild claudication   4) PAH - Moderate to severe by cath 5/21 and 2/22.  - suspect multifactorial  - wearing CPAP - sildenafil started 2/22 having trouble with side effetcs  5) Paroxysmal atrial fib/flutter - remains in AFL today. Rate controlled. Asx This patients CHA2DS2-VASc Score and unadjusted  Ischemic Stroke Rate (% per year) is equal to 7.2 % stroke rate/year from a score of 5 Above score calculated as 2 points each if present [Age > 75, or Stroke/TIA/TE] - Continue Eliquis. No bleeding  6) OSA/COPD - Follows with Dr. Halford Chessman.  - Compliant with CPAP  7) HTN - Blood pressure well controlled even low at times - she wants a more conservative approach. Stop amlodipine. Take only for SBP >= 160  8) DM2 - Stopped Jardiance due to cost  - No change  9) HL  - Unable to tolerate statins. On Repatha/Zetia - Follows with Lipid Clinic  10) End of life planning -> DNR/DNI - she is clear that she wants to reduce the aggressiveness of her care and put comfort first - she requested Gold Form for DNR/DNI which we filled out - will stop multiple non-essential meds including: amlodipine, vitamin D, zetia, repatha, sildenafil  - have placed Palliative Care consult - will see back in 1 month to check in on her  Total time spent 45 minutes. Over half that time spent discussing above.   Signed, Glori Bickers, MD  02/16/2021 9:35 PM  [facility] [address] Heart and [place] [address] [phone] (office) [phone] (fax)

## 2021-02-16 NOTE — Assessment & Plan Note (Signed)
Chronic Has improved sugars - less than 130  Improved diet and on 1/2 the insulin dose Has f/u with Dr Loanne Drilling - he is managing

## 2021-02-16 NOTE — Assessment & Plan Note (Signed)
Acute on chronic Improved in hospital with IV lasix Weight stable since d/c and no SOB, but was started on oxygen and sildenafil Having b/l LE edema that is worse - ? Related to sildenafil or heart failure She feels Lasix not working as well Sees Dr Sung Amabile in a few days - will let him adjust meds and hold off on labs until she sees him - she is a hard stick Elevate legs as much as possible, monitor weights Continue current lasix dose and sildenafil dose

## 2021-02-16 NOTE — Patient Instructions (Signed)
Visit Information  Phone number for Pharmacist: [phone]  Goals Addressed   None    Patient Care Plan: CCM Pharmacy Care Plan    Problem Identified: Hypertension, Hyperlipidemia, Diabetes, Atrial Fibrillation, Heart Failure, Coronary Artery Disease and Osteopenia   Priority: High    Long-Range Goal: Disease management   Start Date: 02/16/2021  Expected End Date: 08/16/2021  This Visit's Progress: On track  Priority: High  Note:   Current Barriers:  . Unable to independently afford treatment regimen . Unable to independently monitor therapeutic efficacy . Unable to maintain control of diabetes  Pharmacist Clinical Goal(s):  Marland Kitchen Over the next 90 days, patient will verbalize ability to afford treatment regimen . achieve adherence to monitoring guidelines and medication adherence to achieve therapeutic efficacy . maintain control of diabetes as evidenced by improved A1c  through collaboration with PharmD and provider.   Interventions: . 1:1 collaboration with [name], MD regarding development and update of comprehensive plan of care as evidenced by provider attestation and co-signature . Inter-disciplinary care team collaboration (see longitudinal plan of care) . Comprehensive medication review performed; medication list updated in electronic medical record  Hypertension / Heart Failure / PAH (BP goal < 140/90) Controlled - symptoms improving since hospitalization, however pt is reporting swelling in feet that she thinks is due to sildenafil. Current regimen:  ? Amlodipine 10 mg daily ? Carvedilol 6.25 mg twice a day ? Furosemide 80 mg twice a day ? Potassium 20 mEq twice a day ? Sildenafil 20 mg (Revatio) 3 times daily (PAH) Interventions: ? Discussed BP goals and benefits of medication for prevention of heart attack / stroke ? Discussed weighing daily and when to increase furosemide if weight gain of 3+ lbs overnight ? Educated on sildenafil side effects. Advised to  discuss with PCP and cardiologist at appts this week Patient self care activities ? Check BP 3 times a week, document, and provide at future appointments ? Ensure daily salt intake < 2300 mg/day ? Follow up with PCP and cardiologist   Hyperlipidemia / Coronary artery disease (LDL goal < 70) HX CAD (last 05/16/20 PCI w/ DES)  HX PAD s/p stenting Not ideally controlled - last LDL May 2021, has initiated Repatha since then. Pt has not been taking Repatha for ~2 months due to cost - for this reason it was removed from list during hospitalization earlier this month. Clinically there is no reason she cannot take Repatha, and pt would benefit from continuation of the medication. She was using [place] to pay for it last year but it ran out. Current regimen:  ? Clopidogrel 75 mg daily (plan to stop May 2022) ? Repatha 140 mg every 14 days (not taking right now d/t cost) ? Ezetimibe 10 mg daily ? Nitroglycerin 0.4 mg as needed Interventions: ? Discussed cholesterol goals and benefits of medication for prevention of heart attack / stroke ? Re-enrolled patient in [employer] for [place] - she will receive medication via delivery from [facility] Patient self care activities ? Continue medications as prescribed   Diabetes (A1c goal < 8%) Uncontrolled, but improving now. Jardiance was stopped in the hospital and insulin dose was changed to BID. Home BG readings: <130 AM, < 200 throughout the day Current regimen:  ? Humalog 75/25 18 units AM and PM Interventions: ? Discussed A1c goals and benefits of medication for prevention of diabetic complications ? Counseled on dosing change in the hospital, patient-reported blood sugars have vastly improved. Patient self care activities ?  Check blood sugar in the morning before eating or drinking, document, and provide at future appointments ? Contact provider with any episodes of hypoglycemia   Atrial Fibrillation Controlled Current  regimen:  ? Carvedilol 6.25 mg twice a day ? Eliquis 5 mg twice a day Interventions: ? Discussed bleeding risk associated with Eliquis (especially with addition of clopidogrel)  ? May consider Eliquis Patient assistance once she reaches donut hole Patient self care activities ? Monitor for signs and symptoms of bleeding ? Avoid NSAIDs - ibuprofen, naproxen ? Contact pharmacist when donut hole is reached   Osteopenia Current regimen:  ? Vitamin D 1000 IU daily Interventions: ? Recommend calcium 500-600 mg daily in addition to dietary calcium Patient self care activities  ? Order Citracal through [facility] Mail order as part of OTC benefit  Patient Goals/Self-Care Activities . Over the next 90 days, patient will:  - take medications as prescribed focus on medication adherence by pill box check glucose twice a day, document, and provide at future appointments weigh daily, and contact provider if weight gain of 3+ lbs overnight collaborate with provider on medication access solutions  Follow Up Plan: Telephone follow up appointment with care management team member scheduled for: 3 months      The patient verbalized understanding of instructions, educational materials, and care plan provided today and declined offer to receive copy of patient instructions, educational materials, and care plan.  Telephone follow up appointment with pharmacy team member scheduled for: 3 months  Charlene Brooke, PharmD, [facility] Clinical Pharmacist [name] [phone]

## 2021-02-16 NOTE — Progress Notes (Signed)
Chronic Care Management Pharmacy Note  02/16/2021 Name:  [name] MRN:  [id number] DOB:  July 23, 1931  Subjective: [name] is an 87 y.o. year old female who is a primary patient of Burns, [name], MD.  The CCM team was consulted for assistance with disease management and care coordination needs.    Engaged with patient by telephone for follow up visit in response to provider referral for pharmacy case management and/or care coordination services.   Consent to Services:  The patient was given the following information about Chronic Care Management services today, agreed to services, and gave verbal consent: 1. CCM service includes personalized support from designated clinical staff supervised by the primary care provider, including individualized plan of care and coordination with other care providers 2. 24/7 contact phone numbers for assistance for urgent and routine care needs. 3. Service will only be billed when office clinical staff spend 20 minutes or more in a month to coordinate care. 4. Only one practitioner may furnish and bill the service in a calendar month. 5.The patient may stop CCM services at any time (effective at the end of the month) by phone call to the office staff. 6. The patient will be responsible for cost sharing (co-pay) of up to 20% of the service fee (after annual deductible is met). Patient agreed to services and consent obtained.  Patient Care Team: [name], MD as PCP - General (Internal Medicine) Debara Pickett Nadean Corwin, MD as PCP - Cardiology (Cardiology) Bensimhon, Shaune Pascal, MD as PCP - Advanced Heart Failure (Cardiology) Chesley Mires, MD as Consulting Physician (Pulmonary Disease) Renato Shin, MD as Consulting Physician (Endocrinology) Bensimhon, Shaune Pascal, MD as Consulting Physician (Cardiology) Charlton Haws, [facility] as Pharmacist (Pharmacist) Knox Royalty, RN as Case Manager  Born in [place], lived in [place] for 40 years and moved back here  25 years ago. Son and Daughter, 2 grandchildren. 5 months ago moved into a retirement community. 3 chef-cooked meals, activities, exercise, yoga, games, concerts, carnivals.    Recent office visits: 02/20/20 Dr Quay Burow OV: referred to allergist for rash, referred to podiatry for foot pain. Referred to pulmonary to adjust CPAP.  Recent consult visits: 12/18/20 Dr Haroldine Laws (CHF): increased amlodipine to 10 mg (BP 160/88). Also provided Eliquis samples.  12/02/20 Dr Loanne Drilling (endocrine): changed insulin to Humalog 75/25 36 units daily w/ breakfast. Hx AM hypoglycemia and PM hyperglycemia.  08/13/20 Dr Haroldine Laws (CHF clinic): chronic f/u for CAD, CHF, PAH, Afib - conditions stable, no med changes. Enrolled in cardiac rehab.  07/15/20 Dr Halford Chessman (pulmonary): f/u for OSA, COPD. Compliant with CPAP, stop Spiriva d/t cost and not helping.  07/03/20 Dr Debara Pickett (cardiolgy): f/u lipid clinic. LDL improved 207 > 140 on Repatha, then to 96 with ezetimibe. Will work on lifestyle modification, if no improvement will try bempedoic acid.  05/15/20 - 05/17/20 Hospitalization for unstable angina, DES x 2. Plan DAPT (clopidogrel + aspirin) x 1 month then clopidogrel for 1 year (also on Eliquis)  Hospital visits: Medication Reconciliation was completed by comparing discharge summary, patient's EMR and Pharmacy list, and upon discussion with patient.  Admitted to the hospital on 01/30/21 due to acute respiratory failure due to CHF. Discharge date was 02/05/21. Discharged from [facility]?Medications Started at [facility] Discharge:?? -started sildenafil 20 mg TID due to [facility] -started supplemental O2 via [place]  Medication Changes at Hospital Discharge: -Changed Humalog 75/25 to 18 units BID  Medications Discontinued at Hospital Discharge: -Repatha  and Jardiance (pt not taking due to cost)   Medications that remain the same after Hospital Discharge:??  -All other medications will remain the same.     Objective:  Lab Results  Component Value Date   CREATININE 0.84 02/05/2021   BUN 16 02/05/2021   GFR 53.03 (L) 02/20/2020   GFRNONAA >60 02/05/2021   GFRAA 46 (L) 06/09/2020   NA 139 02/05/2021   K 3.7 02/05/2021   CALCIUM 9.7 02/05/2021   CO2 37 (H) 02/05/2021    Lab Results  Component Value Date/Time   HGBA1C 9.5 (A) 12/02/2020 10:20 AM   HGBA1C 7.8 (A) 08/21/2020 10:19 AM   HGBA1C 8.3 (H) 05/15/2020 05:22 PM   HGBA1C 9.2 (H) 02/20/2020 12:39 PM   GFR 53.03 (L) 02/20/2020 12:39 PM   GFR 52.65 (L) 01/31/2019 10:33 AM   MICROALBUR 11.9 (H) 07/31/2018 10:22 AM   MICROALBUR 1.6 09/08/2016 11:59 AM    Last diabetic Eye exam:  Lab Results  Component Value Date/Time   HMDIABEYEEXA No Retinopathy 06/05/2018 12:00 AM    Last diabetic Foot exam: No results found for: HMDIABFOOTEX   Lab Results  Component Value Date   CHOL 164 05/16/2020   HDL 50 05/16/2020   LDLCALC 96 05/16/2020   LDLDIRECT 144.6 02/03/2010   TRIG 90 05/16/2020   CHOLHDL 3.3 05/16/2020    Hepatic Function Latest Ref Rng & Units 02/05/2021 02/02/2021 02/01/2021  Total Protein 6.5 - 8.1 g/dL 6.4(L) 6.0(L) 6.4(L)  Albumin 3.5 - 5.0 g/dL 3.0(L) 2.8(L) 3.0(L)  AST 15 - 41 U/L 20 23 23   ALT 0 - 44 U/L 18 15 19   Alk Phosphatase 38 - 126 U/L 78 72 76  Total Bilirubin 0.3 - 1.2 mg/dL 0.9 1.3(H) 1.1  Bilirubin, Direct 0.0 - 0.2 mg/dL - - -    Lab Results  Component Value Date/Time   TSH 0.866 05/15/2020 05:22 PM   TSH 1.93 02/26/2020 10:53 AM   TSH 1.50 08/22/2019 02:12 PM   FREET4 0.87 08/22/2019 02:12 PM   FREET4 0.94 02/22/2019 03:18 PM    CBC Latest Ref Rng & Units 02/05/2021 02/04/2021 02/04/2021  WBC 4.0 - 10.5 K/uL 5.1 - -  Hemoglobin 12.0 - 15.0 g/dL 14.7 17.0(H) 16.0(H)  Hematocrit 36.0 - 46.0 % 46.5(H) 50.0(H) 47.0(H)  Platelets 150 - 400 K/uL 156 - -    Lab Results  Component Value Date/Time   VD25OH 63 06/15/2010 08:19 PM    Clinical ASCVD: Yes  The ASCVD Risk score Mikey Bussing DC Jr., et  al., 2013) failed to calculate for the following reasons:   The 2013 ASCVD risk score is only valid for ages 28 to 55   The patient has a prior MI or stroke diagnosis    Depression screen [facility] 2/9 09/19/2020 07/17/2020 10/12/2019  Decreased Interest 0 0 0  Down, Depressed, Hopeless 0 0 1  PHQ - 2 Score 0 0 1  Some recent data might be hidden     CHA2DS2-VASc Score = 7  The patient's score is based upon: CHF History: Yes HTN History: Yes Diabetes History: Yes Stroke History: No Vascular Disease History: Yes     Social History   Tobacco Use  Smoking Status Former Smoker  . Packs/day: 0.25  . Years: 18.00  . Pack years: 4.50  . Types: Cigarettes  . Quit date: 12/27/1968  . Years since quitting: 52.1  Smokeless Tobacco Never Used  Tobacco Comment   smoked [place], up to 1 pp week  BP Readings from Last 3 Encounters:  02/05/21 (!) 154/58  12/18/20 (!) 160/88  12/02/20 130/60   Pulse Readings from Last 3 Encounters:  02/05/21 81  12/18/20 87  12/02/20 78   Wt Readings from Last 3 Encounters:  02/02/21 167 lb 6.4 oz (75.9 kg)  12/18/20 163 lb 6.4 oz (74.1 kg)  12/02/20 162 lb 9.6 oz (73.8 kg)    Assessment/Interventions: Review of patient past medical history, allergies, medications, health status, including review of consultants reports, laboratory and other test data, was performed as part of comprehensive evaluation and provision of chronic care management services.   SDOH:  (Social Determinants of Health) assessments and interventions performed: Yes SDOH Interventions   Flowsheet Row Most Recent Value  SDOH Interventions   Financial Strain Interventions Other (Comment)  [Enrolled in [employer] for [place]. Consider Eliquis PAP once donut hole is reached.]      CCM Care Plan  Allergies  Allergen Reactions  . Levaquin [Levofloxacin] Shortness Of Breath and Swelling    angioedema  . Lobster [Shellfish Allergy] Other (See Comments)    angioedema  .  Penicillins Rash    Has patient had a PCN reaction causing immediate rash, facial/tongue/throat swelling, SOB or lightheadedness with hypotension: Yes Has patient had a PCN reaction causing severe rash involving mucus membranes or skin necrosis: No Has patient had a PCN reaction that required hospitalization: No Has patient had a PCN reaction occurring within the last 10 years: No If all of the above answers are "NO", then may proceed with Cephalosporin use.  . Valsartan Other (See Comments)    REACTION: angioedema  . Codeine Other (See Comments)    Mental status changes  . Lipitor [Atorvastatin] Other (See Comments)    weakness  . Oxycodone Other (See Comments)    hallucinations  . Statins     Made her too weak  . Tramadol   . Amlodipine Besylate Other (See Comments)    REACTION: tingling in lips & gum edema 7/12: talked to patient, states she is tolerating well  . Crestor [Rosuvastatin Calcium] Rash  . Tramadol Hcl Nausea And Vomiting    Medications Reviewed Today    Reviewed by Charlton Haws, [facility] (Pharmacist) on 02/16/21 at 1214  Med List Status: <None>  Medication Order Taking? Sig Documenting Provider Last Dose Status Informant  acetaminophen (TYLENOL) 325 MG tablet [id number] Yes Take 2 tablets (650 mg total) by mouth every 6 (six) hours as needed for mild pain or headache. Sheikh, Omair Millwood, DO Taking Active   amLODipine (NORVASC) 10 MG tablet [id number] Yes Take 1 tablet (10 mg total) by mouth daily. Bensimhon, Shaune Pascal, MD Taking Active   CALCIUM-MAG-VIT C-VIT D PO [id number] Yes Take 1 tablet by mouth daily.  [provider] Taking Active Self  carvedilol (COREG) 6.25 MG tablet [id number] Yes TAKE 1 TABLET BY MOUTH  TWICE DAILY WITH MEALS  Patient taking differently: Take 6.25 mg by mouth 2 (two) times daily with a meal.   Bensimhon, Shaune Pascal, MD Taking Active   cholecalciferol (VITAMIN D) 1000 units tablet [id number] Yes Take 1,000 Units by mouth daily after  supper. [provider] Taking Active Self  clopidogrel (PLAVIX) 75 MG tablet [id number] Yes TAKE 1 TABLET BY MOUTH ONCE DAILY WITH BREAKFAST  Patient taking differently: Take 75 mg by mouth daily.   Pixie Casino, MD Taking Active   ELIQUIS 5 MG TABS tablet [id number] Yes TAKE 1 TABLET BY MOUTH  TWICE DAILY  Patient  taking differently: Take 5 mg by mouth 2 (two) times daily.   Bensimhon, Shaune Pascal, MD Taking Active   Evolocumab [facility] SURECLICK) 627 MG/ML Darden Palmer [id number] No Inject 140 mg into the skin every 14 (fourteen) days.  Patient not taking: Reported on 02/16/2021   [provider] Not Taking Active   ezetimibe (ZETIA) 10 MG tablet [id number] Yes Take 1 tablet (10 mg total) by mouth daily. Pixie Casino, MD Taking Active   furosemide (LASIX) 80 MG tablet [id number] Yes TAKE 1 TABLET BY MOUTH  TWICE DAILY  Patient taking differently: Take 80 mg by mouth 2 (two) times daily.   Bensimhon, Shaune Pascal, MD Taking Active   glucose blood [facility] VERIO) test strip [id number] Yes USE TO MONITOR GLUCOSE  LEVELS TWICE DAILY Renato Shin, MD Taking Active   Insulin Lispro Prot & Lispro (HUMALOG MIX 75/25 KWIKPEN) (75-25) 100 UNIT/ML Claiborne Rigg [id number] Yes Inject 18 units into the skin daily with breakfast and dinner; Have Endocrinologist adjust accordingly Kerney Elbe, DO Taking Active   Lancets (ONETOUCH DELICA PLUS IRWERX54M) [place] [id number] Yes USE TO MONITOR GLUCOSE  LEVELS TWICE DAILY Renato Shin, MD Taking Active Self  Multiple Vitamin (MULTIVITAMIN WITH MINERALS) TABS tablet [id number] Yes Take 1 tablet by mouth daily. Centrum Silver [provider] Taking Active Self  nitroGLYCERIN (NITROSTAT) 0.4 MG SL tablet [id number] Yes Place 1 tablet (0.4 mg total) under the tongue every 5 (five) minutes as needed for chest pain. Reported on 01/28/2016 Bensimhon, Shaune Pascal, MD Taking Active Self  potassium chloride SA (KLOR-CON) 20 MEQ tablet [id number] Yes Take 20 mEq by  mouth 2 (two) times daily. [provider] Taking Active   sildenafil (REVATIO) 20 MG tablet [id number] Yes Take 1 tablet (20 mg total) by mouth 3 (three) times daily. Kerney Elbe, DO Taking Active           Patient Active Problem List   Diagnosis Date Noted  . Acute respiratory failure with hypoxia and hypercapnia ([place]) 01/31/2021  . Acute on chronic diastolic CHF (congestive heart failure) ([place]) 01/31/2021  . Elevated troponin 01/31/2021  . Type 2 diabetes mellitus with hyperlipidemia ([place]) 01/31/2021  . Neuropathy 08/19/2020  . PAD (peripheral artery disease) ([place]) 02/29/2020  . Chronic kidney disease (CKD) 02/20/2020  . Osteoarthritis 08/01/2019  . Arthritis of left sacroiliac joint 05/04/2017  . Osteopenia 02/17/2017  . Piriformis syndrome of left side 02/16/2017  . Acquired leg length discrepancy 02/16/2017  . COPD GOLD I  01/27/2017  . Lumbar radiculopathy, acute 01/27/2017  . Granuloma annulare 07/27/2016  . Numbness of fingers 07/27/2016  . Primary osteoarthritis of right knee 06/27/2016  . Rash and nonspecific skin eruption 06/09/2016  . Diabetes ([place]) 03/07/2016  . OSA  08/28/2014  . Chronic diastolic heart failure ([place]) 07/30/2014  . Nocturnal hypoxemia 07/09/2014  . SOB (shortness of breath) 05/15/2014  . Pulmonary HTN ([place]) 05/15/2014  . Band keratopathy 01/16/2014  . 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 Hyperthyroidism 11/04/2011  . Multinodular goiter 10/14/2011  . Atrophy, Fuchs' 09/29/2011  . ATRIAL FIBRILLATION 06/03/2010  . VITILIGO 11/07/2009  . SINOATRIAL NODE DYSFUNCTION 06/16/2009  . Atrial flutter ([place]) 06/25/2008  . HYPERLIPIDEMIA 01/29/2008  . Coronary atherosclerosis 01/29/2008  . HTN (hypertension) 10/30/2007    Immunization History  Administered Date(s) Administered  . Fluad Quad(high Dose 65+) 08/22/2019  . Influenza Split 11/16/2011, 09/26/2012  . Influenza Whole 12/27/2004, 10/21/2008, 10/20/2009, 11/11/2010   . Influenza, High Dose Seasonal PF 10/14/2016, 10/10/2017, 09/28/2018  .  Influenza,inj,Quad PF,6+ Mos 09/24/2013, 12/16/2014, 09/19/2015  . Moderna Sars-Covid-2 Vaccination 02/08/2020, 03/10/2020  . PPD Test 05/31/2015, 07/07/2016  . Pneumococcal Conjugate-13 07/31/2018  . Pneumococcal Polysaccharide-23 12/16/2014  . Pneumococcal-Unspecified 05/31/2015  . Td 06/15/2010    Conditions to be addressed/monitored:  Hypertension, Hyperlipidemia, Diabetes, Atrial Fibrillation, Heart Failure, Coronary Artery Disease and Osteopenia  Care Plan : [place]  Updates made by Charlton Haws, [place] since 02/16/2021 12:00 AM    Problem: Hypertension, Hyperlipidemia, Diabetes, Atrial Fibrillation, Heart Failure, Coronary Artery Disease and Osteopenia   Priority: High    Long-Range Goal: Disease management   Start Date: 02/16/2021  Expected End Date: 08/16/2021  This Visit's Progress: On track  Priority: High  Note:   Current Barriers:  . Unable to independently afford treatment regimen . Unable to independently monitor therapeutic efficacy . Unable to maintain control of diabetes  Pharmacist Clinical Goal(s):  Marland Kitchen Over the next 90 days, patient will verbalize ability to afford treatment regimen . achieve adherence to monitoring guidelines and medication adherence to achieve therapeutic efficacy . maintain control of diabetes as evidenced by improved A1c  through collaboration with PharmD and provider.   Interventions: . 1:1 collaboration with [name], MD regarding development and update of comprehensive plan of care as evidenced by provider attestation and co-signature . Inter-disciplinary care team collaboration (see longitudinal plan of care) . Comprehensive medication review performed; medication list updated in electronic medical record  Hypertension / Heart Failure / PAH (BP goal < 140/90) Controlled - symptoms improving since hospitalization, however pt is reporting  swelling in feet that she thinks is due to sildenafil. Current regimen:  ? Amlodipine 10 mg daily ? Carvedilol 6.25 mg twice a day ? Furosemide 80 mg twice a day ? Potassium 20 mEq twice a day ? Sildenafil 20 mg (Revatio) 3 times daily (PAH) Interventions: ? Discussed BP goals and benefits of medication for prevention of heart attack / stroke ? Discussed weighing daily and when to increase furosemide if weight gain of 3+ lbs overnight ? Educated on sildenafil side effects. Advised to discuss with PCP and cardiologist at appts this week Patient self care activities ? Check BP 3 times a week, document, and provide at future appointments ? Ensure daily salt intake < 2300 mg/day ? Follow up with PCP and cardiologist   Hyperlipidemia / Coronary artery disease (LDL goal < 70) HX CAD (last 05/16/20 PCI w/ DES)  HX PAD s/p stenting Not ideally controlled - last LDL May 2021, has initiated Repatha since then. Pt has not been taking Repatha for ~2 months due to cost - for this reason it was removed from list during hospitalization earlier this month. Clinically there is no reason she cannot take Repatha, and pt would benefit from continuation of the medication. She was using [place] to pay for it last year but it ran out. Current regimen:  ? Clopidogrel 75 mg daily (plan to stop May 2022) ? Repatha 140 mg every 14 days (not taking right now d/t cost) ? Ezetimibe 10 mg daily ? Nitroglycerin 0.4 mg as needed Interventions: ? Discussed cholesterol goals and benefits of medication for prevention of heart attack / stroke ? Re-enrolled patient in [employer] for [place] - she will receive medication via delivery from [facility] Patient self care activities ? Continue medications as prescribed   Diabetes (A1c goal < 8%) Uncontrolled, but improving now. Jardiance was stopped in the hospital and insulin dose was changed to BID. Home  BG readings: <130 AM, < 200 throughout the  day Current regimen:  ? Humalog 75/25 18 units AM and PM Interventions: ? Discussed A1c goals and benefits of medication for prevention of diabetic complications ? Counseled on dosing change in the hospital, patient-reported blood sugars have vastly improved. Patient self care activities ? Check blood sugar in the morning before eating or drinking, document, and provide at future appointments ? Contact provider with any episodes of hypoglycemia   Atrial Fibrillation Controlled Current regimen:  ? Carvedilol 6.25 mg twice a day ? Eliquis 5 mg twice a day Interventions: ? Discussed bleeding risk associated with Eliquis (especially with addition of clopidogrel)  ? May consider Eliquis Patient assistance once she reaches donut hole Patient self care activities ? Monitor for signs and symptoms of bleeding ? Avoid NSAIDs - ibuprofen, naproxen ? Contact pharmacist when donut hole is reached   Osteopenia Current regimen:  ? Vitamin D 1000 IU daily Interventions: ? Recommend calcium 500-600 mg daily in addition to dietary calcium Patient self care activities  ? Order Citracal through [facility] Mail order as part of OTC benefit  Patient Goals/Self-Care Activities . Over the next 90 days, patient will:  - take medications as prescribed focus on medication adherence by pill box check glucose twice a day, document, and provide at future appointments weigh daily, and contact provider if weight gain of 3+ lbs overnight collaborate with provider on medication access solutions  Follow Up Plan: Telephone follow up appointment with care management team member scheduled for: 3 months      Medication Assistance: Repatha obtained through [place] medication assistance program.  Enrollment ends 01/16/2022  [employer] ID: [id number]  Pharmacy Copay card ID: [id number]  RxBIN: [id number]  RxGRP: [id number]  RxPCN: [id number]  **Repatha will be filled at [facility] for delivery to  patient.  Patient's preferred pharmacy is:  [facility] [address], [address] [place], [address] [place] [address] Phone: [phone] Fax: [phone]  [facility] - [place], [place] - [address] [place] [address] Phone: [phone] Fax: [phone]  Uses pill box? Yes Pt endorses 100% compliance  We discussed: Current pharmacy is preferred with insurance plan and patient is satisfied with pharmacy services Patient decided to: Continue current medication management strategy  Care Plan and Follow Up Patient Decision:  Patient agrees to Care Plan and Follow-up.  Plan: Telephone follow up appointment with care management team member scheduled for:  3 months  Charlene Brooke, PharmD, [facility] Clinical Pharmacist [facility] [phone]

## 2021-02-16 NOTE — Assessment & Plan Note (Signed)
New Started on oxygen 2-3 L via [place] in the hospital Related to combined diastolic/systolic HF and severe pulm htn Advised to continue to monitor oxygen - can try to decrease oxygen to 2 L / min - keep pulse ox above 90

## 2021-02-16 NOTE — Assessment & Plan Note (Signed)
Chronic Recent R heart cath showed severe pulm art htn Started on sildenafil 20 mg TID - she is concerned this may be causing some LE edema - no increase in weight or SOB

## 2021-02-17 ENCOUNTER — Ambulatory Visit: Payer: Medicare Other | Admitting: *Deleted

## 2021-02-17 ENCOUNTER — Telehealth: Payer: Self-pay | Admitting: *Deleted

## 2021-02-17 ENCOUNTER — Telehealth: Payer: Medicare Other

## 2021-02-17 ENCOUNTER — Encounter: Payer: Self-pay | Admitting: *Deleted

## 2021-02-17 DIAGNOSIS — J449 Chronic obstructive pulmonary disease, unspecified: Secondary | ICD-10-CM | POA: Diagnosis not present

## 2021-02-17 DIAGNOSIS — J9601 Acute respiratory failure with hypoxia: Secondary | ICD-10-CM | POA: Diagnosis not present

## 2021-02-17 DIAGNOSIS — M6281 Muscle weakness (generalized): Secondary | ICD-10-CM | POA: Diagnosis not present

## 2021-02-17 DIAGNOSIS — R262 Difficulty in walking, not elsewhere classified: Secondary | ICD-10-CM | POA: Diagnosis not present

## 2021-02-17 DIAGNOSIS — I5032 Chronic diastolic (congestive) heart failure: Secondary | ICD-10-CM

## 2021-02-17 DIAGNOSIS — N1831 Chronic kidney disease, stage 3a: Secondary | ICD-10-CM

## 2021-02-17 NOTE — Telephone Encounter (Signed)
  Chronic Care Management   Outreach Note  02/17/2021 Name: [name] MRN: [id number] DOB: 04-29-1931  Referred by: [name], MD Reason for referral : Chronic Care Management (RN CM Telephone Outreach: CHF; DM; HTN)  An unsuccessful second telephone outreach was attempted today x 2. The patient was referred to the case management team for assistance with care management and care coordination.   Follow Up Plan:   A HIPAA compliant phone message was left for the patient providing contact information and requesting a return call.   The patient has been provided with contact information for the care management team and has been advised to call with any health related questions or concerns.   [name], RN, BSN, [facility] RN Care Coordination- [place] [phone]

## 2021-02-17 NOTE — Patient Instructions (Signed)
[facility] [place], it was really nice talking with you today.  I look forward to an update next week and will call you on Wednesday February 25, 2021 as close to 2:00 pm as possible.  Please do not hesitate to contact me if you have questions or concerns prior to our scheduled call next week.  PATIENT GOALS: Goals Addressed            This Visit's Progress   . Monitor and Manage My Blood Sugar-Diabetes Type 2   On track    Timeframe:  Long-Range Goal Priority:  High Start Date:       02/17/21                      Expected End Date:       05/18/21                Follow Up Date 02/25/21    . check blood sugar at prescribed times . check blood sugar if I feel it is too high or too low . enter blood sugar readings and medication or insulin into daily log . take the blood sugar log to all doctor visits  . daily monitoring and recording of CBG twice daily  . adherence to ADA/ carb modified diet  . Light exercise with established PT at [facility] facility 4-5 days/week  . adherence to prescribed medication regimen  . contacting care providers for new or worsened symptoms, concerns or questions   Why is this important?    Checking your blood sugar at home helps to keep it from getting very high or very low.   Writing the results in a diary or log helps the doctor know how to care for you.   Your blood sugar log should have the time, date and the results.   Also, write down the amount of insulin or other medicine that you take.   Other information, like what you ate, exercise done and how you were feeling, will also be helpful.         . Track and Manage Fluids and Swelling-Heart Failure   On track    Timeframe:  Long-Range Goal Priority:  High Start Date:         02/17/21                    Expected End Date:       05/18/21                Follow Up Date 02/25/21   . Use home oxygen as prescribed; talk to Dr. Haroldine Laws about your questions around using home oxygen,  possibly obtaining a lightweight portable O2 tank, how much oxygen you should be using at home during rest and activity . Monitor your oxygen levels regularly at home: with activity and at rest, and if you ever start to feel unusually short of breath . weigh myself daily . call office if I gain more than 2 pounds in one day or 5 pounds in one week . track weight in diary . use salt in moderation and continue to monitor the amount of fluid you take in every day . watch for swelling in feet, ankles and legs every day    Why is this important?    It is important to check your weight daily and watch how much salt and liquids you have.   It will help you to manage your heart failure.  Home Oxygen Use, Adult When a medical condition keeps you from getting enough oxygen, your health care provider may instruct you to take extra oxygen at home. Your health care provider will let you know:  When to take oxygen.  How long to take oxygen.  How quickly oxygen should be delivered (flow rate), in liters per minute (LPM or L/M). Home oxygen can be given through:  A mask.  A nasal cannula. This is a device or tube that goes in the nostrils.  A transtracheal catheter. This is a small, thin tube placed in the windpipe (trachea).  A breathing tube (tracheostomy tube) that is surgically placed in the windpipe. This may be used in severe cases. These devices are connected with tubing to an oxygen source, such as:  A tank. Tanks hold oxygen in gas form. They must be replaced when the oxygen is used up.  A liquid oxygen device. This holds oxygen in liquid form. Liquid oxygen is very cold. It must be replaced when the oxygen is used up.  An oxygen concentrator machine. This filters oxygen in the room. There are two types of oxygen concentrator machines--stationary and portable. ? A stationary oxygen concentrator machine plugs into the main electricity supply at your home. You must have a  backup cylinder of oxygen in case the power goes out. ? A portable oxygen concentrator machine is smaller in size and more lightweight. This machine uses battery supply and can be used outside the home. Work with your health care provider to find equipment that works best for you and your lifestyle. What are the risks? Delivery of supplemental oxygen is generally safe. However, some risks include:  Fire. This can happen if the oxygen is exposed to a heat source, flame, or spark.  Injury to skin. This can happen if liquid oxygen touches your skin.  Damage to the lungs or other organs. This can happen from getting too little or too much oxygen. Supplies needed: To use oxygen, you will need:  A mask, nasal cannula, transtracheal catheter, or tracheostomy.  An oxygen tank, a liquid oxygen device, or an oxygen concentrator.  The tape that your health care provider recommends (optional). Your health care provider may also recommend:  A humidifier to warm and moisten the oxygen delivered. This will depend on how much oxygen you need and the type of home oxygen device you use.  A pulse oximeter. This device measures the percentage of oxygen in your blood. How to use oxygen Your health care provider or a person from your [place] will show you how to use your oxygen device. Follow his or her instructions. The instructions may look something like this: 1. Wash your hands with soap and water. 2. If you use an oxygen concentrator, make sure it is plugged in. 3. Place one end of the tube into the port on the tank, device, or machine. 4. Place the mask over your nose and mouth. Or, place the nasal cannula and secure it with tape if instructed. If you use a tracheostomy or transtracheal catheter, connect it to the oxygen source as directed. 5. Make sure the liter-flow setting on the machine is at the level prescribed by your health care provider. 6. Turn on the machine or adjust the  knob on the tank or device to the correct liter-flow setting. 7. When you are done, turn off and unplug the machine, or turn the knob to OFF.   How to clean and care for the oxygen  supplies Nasal cannula  Clean it with a warm, wet cloth daily or as needed.  Wash it with a liquid soap once a week.  Rinse it thoroughly once or twice a week.  Air-dry it.  Replace it every 2-4 weeks.  If you have an infection, such as a cold or pneumonia, change the cannula when you get better. Mask  Replace it every 2-4 weeks.  If you have an infection, such as a cold or pneumonia, change the mask when you get better. Humidifier bottle  Wash the bottle between each refill: ? Wash it with soap and warm water. ? Rinse it thoroughly. ? Clean it and its top with a disinfectant cleaner. ? Air-dry it. ? Make sure it is dry before you refill it. Oxygen concentrator  Clean the air filter at least twice a week according to directions from your home medical equipment and service company.  Wipe down the cabinet every day. To do this: ? Unplug the unit. ? Wipe down the cabinet with a damp cloth. ? Dry the cabinet. Other equipment  Change any extra tubing every 1-3 months.  Follow instructions from your health care provider about taking care of any other equipment. Safety tips Fire safety tips  Keep your oxygen and oxygen supplies at least 6 ft (2 m) away from sources of heat, flames, and sparks at all times.  Do not allow smoking near your oxygen. Put up "no smoking" signs in your home. Avoid smoking areas when in public.  Do not use materials that can burn (are flammable) while you use oxygen. This includes: ? Petroleum jelly. ? Hair spray or other aerosol sprays. ? Rubbing alcohol. ? Hand sanitizer.  When you go to a restaurant with portable oxygen, ask to be seated in the non-smoking section.  Keep a Data processing manager close by. Let your fire department know that you have oxygen in your  home.  Test your home smoke detectors regularly.   Traveling  Secure your oxygen tank in the vehicle so that it does not move around. Follow instructions from your medical device company about how to safely secure your tank.  Make sure you have enough oxygen for the amount of time you will be away from home.  If you are planning to travel by public transportation (airplane, train, bus, or boat), contact the company to find out if it allows the use of an approved portable oxygen concentrator. You may also need documents from your health care provider and medical device company before you travel. General safety tips  If you use an oxygen cylinder, make sure it is in a stand or secured to an object that will not move (fixed object).  If you use liquid oxygen, make sure its container is kept upright at all times.  If you use an oxygen concentrator: ? [employer] psychotherapist company. Make sure you are given priority service in the event that your power goes out. ? Avoid using extension cords if possible. Follow these instructions at home:  Use oxygen only as told by your health care provider.  Do not use alcohol or other drugs that make you relax (sedating drugs) unless instructed. They can slow down your breathing rate and make it hard to get in enough oxygen.  Know how and when to order a refill of oxygen.  Always keep a spare tank of oxygen. Plan ahead for holidays when you may not be able to get a prescription filled.  Use water-based lubricants on your  lips or nostrils. Do not use oil-based products like petroleum jelly.  To prevent skin irritation on your cheeks or behind your ears, tuck some gauze under the tubing. Where to find more information  American Lung Association: DiabeticMale.de Contact a health care provider if:  You get headaches often.  You have a lasting cough.  You are restless or have anxiety.  You develop an illness that affects your breathing.  You  cannot exercise at your regular level.  You have a fever.  You have persistent redness under your nose. Get help right away if:  You are confused.  You are sleepy all the time.  You have blue lips or fingernails.  You have difficult or irregular breathing that is getting worse.  You are struggling to breathe. These symptoms may represent a serious problem that is an emergency. Do not wait to see if the symptoms will go away. Get medical help right away. Call your local emergency services (911 in the [place] Do not drive yourself to the hospital. Summary  Your health care provider or a person from your [place] will show you how to use your oxygen device. Follow his or her instructions.  If you use an oxygen concentrator, make sure it is plugged in.  Make sure the liter-flow setting on the machine is at the level prescribed by your health care provider.  Use oxygen only as told by your health care provider.  Keep your oxygen and oxygen supplies at least 6 ft (2 m) away from sources of heat, flames, and sparks at all times. This information is not intended to replace advice given to you by your health care provider. Make sure you discuss any questions you have with your health care provider. Document Revised: 02/14/2020 Document Reviewed: 12/11/2019 Elsevier Patient Education  2021 [place].    Patient verbalizes understanding of instructions provided today and agrees to view in [place].   Telephone follow up appointment with care management team member scheduled for:  The patient has been provided with contact information for the care management team and has been advised to call with any health related questions or concerns.   [name], RN, BSN, [facility] RN Care Coordination- [place] [phone]

## 2021-02-17 NOTE — Chronic Care Management (AMB) (Signed)
Chronic Care Management   CCM RN Visit Note  02/17/2021 Name: [name] MRN: [id number] DOB: 1931-01-12  Subjective: [name] is a 87 y.o. year old female who is a primary care patient of Burns, [name], MD. The care management team was consulted for assistance with disease management and care coordination needs.    Engaged with patient by telephone for initial visit in response to provider referral for case management and/or care coordination services.   Consent to Services:  The patient was given information about Chronic Care Management services, agreed to services, and gave verbal consent prior to initiation of services.  Please see initial visit note for detailed documentation.   Patient agreed to services and verbal consent obtained.   Assessment: Review of patient past medical history, allergies, medications, health status, including review of consultants reports, laboratory and other test data, was performed as part of comprehensive evaluation and provision of chronic care management services.   SDOH (Social Determinants of Health) assessments and interventions performed:  SDOH Interventions   Flowsheet Row Most Recent Value  SDOH Interventions   Food Insecurity Interventions Intervention Not Indicated  Housing Interventions Intervention Not Indicated  Transportation Interventions Intervention Not Indicated      CCM Care Plan  Allergies  Allergen Reactions  . Levaquin [Levofloxacin] Shortness Of Breath and Swelling    angioedema  . Lobster [Shellfish Allergy] Other (See Comments)    angioedema  . Penicillins Rash    Has patient had a PCN reaction causing immediate rash, facial/tongue/throat swelling, SOB or lightheadedness with hypotension: Yes Has patient had a PCN reaction causing severe rash involving mucus membranes or skin necrosis: No Has patient had a PCN reaction that required hospitalization: No Has patient had a PCN reaction occurring within  the last 10 years: No If all of the above answers are "NO", then may proceed with Cephalosporin use.  . Valsartan Other (See Comments)    REACTION: angioedema  . Codeine Other (See Comments)    Mental status changes  . Lipitor [Atorvastatin] Other (See Comments)    weakness  . Oxycodone Other (See Comments)    hallucinations  . Statins     Made her too weak  . Tramadol   . Amlodipine Besylate Other (See Comments)    REACTION: tingling in lips & gum edema 7/12: talked to patient, states she is tolerating well  . Crestor [Rosuvastatin Calcium] Rash  . Tramadol Hcl Nausea And Vomiting    Outpatient Encounter Medications as of 02/17/2021  Medication Sig  . acetaminophen (TYLENOL) 325 MG tablet Take 2 tablets (650 mg total) by mouth every 6 (six) hours as needed for mild pain or headache.  Marland Kitchen amLODipine (NORVASC) 10 MG tablet Take 1 tablet (10 mg total) by mouth daily.  . carvedilol (COREG) 6.25 MG tablet TAKE 1 TABLET BY MOUTH  TWICE DAILY WITH MEALS (Patient taking differently: Take 6.25 mg by mouth 2 (two) times daily with a meal.)  . cholecalciferol (VITAMIN D) 1000 units tablet Take 1,000 Units by mouth daily after supper.  . clopidogrel (PLAVIX) 75 MG tablet TAKE 1 TABLET BY MOUTH ONCE DAILY WITH BREAKFAST (Patient taking differently: Take 75 mg by mouth daily.)  . ELIQUIS 5 MG TABS tablet TAKE 1 TABLET BY MOUTH  TWICE DAILY (Patient taking differently: Take 5 mg by mouth 2 (two) times daily.)  . Evolocumab (REPATHA SURECLICK) 510 MG/ML SOAJ Inject 140 mg into the skin every 14 (fourteen) days. (Patient not taking: No sig reported)  .  ezetimibe (ZETIA) 10 MG tablet Take 1 tablet (10 mg total) by mouth daily.  . furosemide (LASIX) 80 MG tablet TAKE 1 TABLET BY MOUTH  TWICE DAILY (Patient taking differently: Take 80 mg by mouth 2 (two) times daily.)  . glucose blood (ONETOUCH VERIO) test strip USE TO MONITOR GLUCOSE  LEVELS TWICE DAILY  . Insulin Lispro Prot & Lispro (HUMALOG MIX 75/25  KWIKPEN) (75-25) 100 UNIT/ML Kwikpen Inject 18 units into the skin daily with breakfast and dinner; Have Endocrinologist adjust accordingly  . Lancets (ONETOUCH DELICA PLUS [id number]) MISC USE TO MONITOR GLUCOSE  LEVELS TWICE DAILY  . Multiple Vitamin (MULTIVITAMIN WITH MINERALS) TABS tablet Take 1 tablet by mouth daily. Centrum Silver  . nitroGLYCERIN (NITROSTAT) 0.4 MG SL tablet Place 1 tablet (0.4 mg total) under the tongue every 5 (five) minutes as needed for chest pain. Reported on 01/28/2016  . NOVOFINE PLUS PEN NEEDLE 32G X 4 MM MISC SMARTSIG:1 Each SUB-Q Daily  . potassium chloride SA (KLOR-CON) 20 MEQ tablet Take 20 mEq by mouth 2 (two) times daily.  . sildenafil (REVATIO) 20 MG tablet Take 1 tablet (20 mg total) by mouth 3 (three) times daily.   No facility-administered encounter medications on file as of 02/17/2021.    Patient Active Problem List   Diagnosis Date Noted  . Acute respiratory failure with hypoxia and hypercapnia ([place]) 01/31/2021  . Elevated troponin 01/31/2021  . Type 2 diabetes mellitus with hyperlipidemia ([place]) 01/31/2021  . Neuropathy 08/19/2020  . PAD (peripheral artery disease) ([place]) 02/29/2020  . Chronic kidney disease (CKD) 02/20/2020  . Osteoarthritis 08/01/2019  . Arthritis of left sacroiliac joint 05/04/2017  . Osteopenia 02/17/2017  . Piriformis syndrome of left side 02/16/2017  . Acquired leg length discrepancy 02/16/2017  . COPD GOLD I  01/27/2017  . Lumbar radiculopathy, acute 01/27/2017  . Granuloma annulare 07/27/2016  . Numbness of fingers 07/27/2016  . Primary osteoarthritis of right knee 06/27/2016  . Rash and nonspecific skin eruption 06/09/2016  . Diabetes ([place]) 03/07/2016  . Acute on chronic combined systolic (congestive) and diastolic (congestive) heart failure ([place])   . OSA  08/28/2014  . Chronic diastolic heart failure ([place]) 07/30/2014  . Nocturnal hypoxemia 07/09/2014  . SOB (shortness of breath) 05/15/2014  . Pulmonary HTN ([place])  05/15/2014  . Band keratopathy 01/16/2014  . 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 Hyperthyroidism 11/04/2011  . Multinodular goiter 10/14/2011  . Atrophy, Fuchs' 09/29/2011  . ATRIAL FIBRILLATION 06/03/2010  . VITILIGO 11/07/2009  . SINOATRIAL NODE DYSFUNCTION 06/16/2009  . Atrial flutter ([place]) 06/25/2008  . HYPERLIPIDEMIA 01/29/2008  . Coronary atherosclerosis 01/29/2008  . HTN (hypertension) 10/30/2007    Conditions to be addressed/monitored:CHF and DMII  Care Plan : Heart Failure (Adult)  Updates made by Knox Royalty, RN since 02/17/2021 12:00 AM    Problem: Symptom Exacerbation (Heart Failure)   Priority: High    Long-Range Goal: Symptom Exacerbation Prevented or Minimized   Start Date: 02/17/2021  Expected End Date: 05/18/2021  This Visit's Progress: On track  Priority: High  Note:   Current Barriers:  Marland Kitchen Knowledge deficits related to use of new home Oxygen and need for ongoing reinforcement of basic heart failure pathophysiology and self care management . Unable to independently verbalize long-term plan for use of home O2, newly prescribed Nurse Case Manager Clinical Goal(s):   Over the next 90 days, patient will weigh self daily and record  Over the next 90 days, patient will verbalize understanding of Heart Failure  Action Plan and when to call doctor  Over the next 90 days, patient will verbalize plan for ongoing use of oxygen at home  Over the next 30 days, patient will not experience hospital re-admission for CHF exacerbation Interventions:  . Collaboration with [name], MD regarding development and update of comprehensive plan of care as evidenced by provider attestation and co-signature . Inter-disciplinary care team collaboration (see longitudinal plan of care) . Basic overview and discussion of pathophysiology of Heart Failure . Discussed with patient EMMI Red-Alert notification from 02/13/21: depression screen completed . Assessed for scales in  home- patient continues to monitor and record daily weight at home as per her baseline routine . Discussed use of newly prescribed home O2; confirmed patient has good general understanding of use of oxygenator and filling tanks; coached patient around talking points to discuss with cardiology (HF) provider around newly prescribed home O2 Patient Goals/Self-Care Activities . Use home oxygen as prescribed; talk to Dr. Haroldine Laws about your questions around using home oxygen, possibly obtaining a lightweight portable O2 tank, how much oxygen you should be using at home during rest and activity . Monitor your oxygen levels regularly at home: with activity and at rest, and if you ever start to feel unusually short of breath . weigh myself daily . call office if I gain more than 2 pounds in one day or 5 pounds in one week . track weight in diary . use salt in moderation and continue to monitor the amount of fluid you take in every day . watch for swelling in feet, ankles and legs every day Follow Up Plan:  . Telephone follow up appointment with care management team member scheduled for: . The patient has been provided with contact information for the care management team and has been advised to call with any health related questions or concerns.     Care Plan : Diabetes Type 2 (Adult)  Updates made by Knox Royalty, RN since 02/17/2021 12:00 AM    Problem: Glycemic Management (Diabetes, Type 2)   Priority: High    Long-Range Goal: Glycemic Management Optimized   Start Date: 02/17/2021  Expected End Date: 05/18/2021  This Visit's Progress: On track  Priority: High  Note:   Objective:  Lab Results  Component Value Date   HGBA1C 9.5 (A) 12/02/2020 .   Lab Results  Component Value Date   CREATININE 0.84 02/05/2021   CREATININE 0.79 02/04/2021   CREATININE 0.84 02/03/2021 .   Marland Kitchen No results found for: EGFR Current Barriers:  Marland Kitchen Knowledge Deficits related to basic Diabetes pathophysiology and  self care/management: patient could benefit from ongoing reinforcement of self-health management strategies for DM . Knowledge Deficits related to medications used for management of diabetes- patient actively engaged with CCM Pharmacist at PCP office . No self-care deficits: patient resides at independent living facility and has support if needed; patient verbalizes accurate general understanding of plan of care post- recent hospital discharge 02/05/21 Case Manager Clinical Goal(s):  Over the next 90 days, patient will demonstrate improved adherence to prescribed treatment plan for diabetes self care/management as evidenced by:  . daily monitoring and recording of CBG twice daily  . adherence to ADA/ carb modified diet  . Light exercise with established PT at [facility] facility 4-5 days/week  . adherence to prescribed medication regimen  . contacting care providers for new or worsened symptoms, concerns or questions Interventions:  . Collaboration with [name], MD regarding development and update of  comprehensive plan of care as evidenced by provider attestation and co-signature . Inter-disciplinary care team collaboration (see longitudinal plan of care) . Provided education to patient about basic DM disease process and diet management . Reviewed medications with patient and discussed importance of medication adherence . Discussed plans with patient for ongoing care management follow up and provided patient with direct contact information for care management team . Advised patient, providing education and rationale, to continue monitoring and recording blood sugars twice/ day at home, calling care providers for any concerns or questions . Review of patient status, including review of consultants reports, relevant laboratory and other test results, and medications completed. Patient Goals: . check blood sugar at prescribed times . check blood sugar if I feel it is too high or too  low . enter blood sugar readings and medication or insulin into daily log . take the blood sugar log to all doctor visits  Self-Care Activities . Self administers oral medications as prescribed . Self administers insulin as prescribed . Attends all scheduled provider appointments . Checks blood sugars as prescribed . Adheres to prescribed ADA/carb modified and is motivated to ongoing learning around same Follow Up Plan:  . Telephone follow up appointment with care management team member scheduled for: 02/25/21 at 2:00 pm . The patient has been provided with contact information for the care management team and has been advised to call with any health related questions or concerns.     Problem: Disease Progression (Diabetes, Type 2)       Plan:  Telephone follow up appointment with care management team member scheduled for:  February 25, 2021 at 2:00 pm   The patient has been provided with contact information for the care management team and has been advised to call with any health related questions or concerns.   [name], RN, BSN, [facility] RN Care Coordination- [place] [phone]

## 2021-02-18 DIAGNOSIS — M6281 Muscle weakness (generalized): Secondary | ICD-10-CM | POA: Diagnosis not present

## 2021-02-18 DIAGNOSIS — J449 Chronic obstructive pulmonary disease, unspecified: Secondary | ICD-10-CM | POA: Diagnosis not present

## 2021-02-18 DIAGNOSIS — J9601 Acute respiratory failure with hypoxia: Secondary | ICD-10-CM | POA: Diagnosis not present

## 2021-02-18 DIAGNOSIS — R262 Difficulty in walking, not elsewhere classified: Secondary | ICD-10-CM | POA: Diagnosis not present

## 2021-02-19 ENCOUNTER — Ambulatory Visit (HOSPITAL_COMMUNITY)
Admission: RE | Admit: 2021-02-19 | Discharge: 2021-02-19 | Disposition: A | Discharge status: Home / Self Care | Payer: Medicare Other | Source: Ambulatory Visit | Attending: Internal Medicine | Admitting: Internal Medicine

## 2021-02-19 ENCOUNTER — Encounter (HOSPITAL_COMMUNITY): Payer: Self-pay | Admitting: Internal Medicine

## 2021-02-19 ENCOUNTER — Other Ambulatory Visit: Payer: Self-pay

## 2021-02-19 VITALS — BP 160/80 | HR 81 | Wt 167.6 lb

## 2021-02-19 DIAGNOSIS — Z66 Do not resuscitate: Secondary | ICD-10-CM | POA: Insufficient documentation

## 2021-02-19 DIAGNOSIS — Z87891 Personal history of nicotine dependence: Secondary | ICD-10-CM | POA: Insufficient documentation

## 2021-02-19 DIAGNOSIS — I110 Hypertensive heart disease with heart failure: Secondary | ICD-10-CM | POA: Insufficient documentation

## 2021-02-19 DIAGNOSIS — Z7901 Long term (current) use of anticoagulants: Secondary | ICD-10-CM | POA: Insufficient documentation

## 2021-02-19 DIAGNOSIS — J441 Chronic obstructive pulmonary disease with (acute) exacerbation: Secondary | ICD-10-CM | POA: Insufficient documentation

## 2021-02-19 DIAGNOSIS — I272 Pulmonary hypertension, unspecified: Secondary | ICD-10-CM | POA: Diagnosis not present

## 2021-02-19 DIAGNOSIS — Z79899 Other long term (current) drug therapy: Secondary | ICD-10-CM | POA: Insufficient documentation

## 2021-02-19 DIAGNOSIS — E1151 Type 2 diabetes mellitus with diabetic peripheral angiopathy without gangrene: Secondary | ICD-10-CM | POA: Diagnosis not present

## 2021-02-19 DIAGNOSIS — M6281 Muscle weakness (generalized): Secondary | ICD-10-CM | POA: Diagnosis not present

## 2021-02-19 DIAGNOSIS — R232 Flushing: Secondary | ICD-10-CM | POA: Diagnosis not present

## 2021-02-19 DIAGNOSIS — I252 Old myocardial infarction: Secondary | ICD-10-CM | POA: Insufficient documentation

## 2021-02-19 DIAGNOSIS — Z8249 Family history of ischemic heart disease and other diseases of the circulatory system: Secondary | ICD-10-CM | POA: Diagnosis not present

## 2021-02-19 DIAGNOSIS — E782 Mixed hyperlipidemia: Secondary | ICD-10-CM | POA: Diagnosis not present

## 2021-02-19 DIAGNOSIS — R262 Difficulty in walking, not elsewhere classified: Secondary | ICD-10-CM | POA: Diagnosis not present

## 2021-02-19 DIAGNOSIS — Z794 Long term (current) use of insulin: Secondary | ICD-10-CM | POA: Insufficient documentation

## 2021-02-19 DIAGNOSIS — I2721 Secondary pulmonary arterial hypertension: Secondary | ICD-10-CM | POA: Diagnosis not present

## 2021-02-19 DIAGNOSIS — G4733 Obstructive sleep apnea (adult) (pediatric): Secondary | ICD-10-CM | POA: Diagnosis not present

## 2021-02-19 DIAGNOSIS — I2510 Atherosclerotic heart disease of native coronary artery without angina pectoris: Secondary | ICD-10-CM | POA: Diagnosis not present

## 2021-02-19 DIAGNOSIS — I5032 Chronic diastolic (congestive) heart failure: Secondary | ICD-10-CM

## 2021-02-19 DIAGNOSIS — Z7902 Long term (current) use of antithrombotics/antiplatelets: Secondary | ICD-10-CM | POA: Insufficient documentation

## 2021-02-19 DIAGNOSIS — I4892 Unspecified atrial flutter: Secondary | ICD-10-CM | POA: Insufficient documentation

## 2021-02-19 DIAGNOSIS — I5042 Chronic combined systolic (congestive) and diastolic (congestive) heart failure: Secondary | ICD-10-CM | POA: Diagnosis not present

## 2021-02-19 DIAGNOSIS — J449 Chronic obstructive pulmonary disease, unspecified: Secondary | ICD-10-CM | POA: Diagnosis not present

## 2021-02-19 DIAGNOSIS — I480 Paroxysmal atrial fibrillation: Secondary | ICD-10-CM | POA: Insufficient documentation

## 2021-02-19 MED ORDER — TORSEMIDE 20 MG PO TABS: 40.0000 mg | ORAL_TABLET | Freq: Two times a day (BID) | ORAL | 3 refills | Status: DC

## 2021-02-19 MED ORDER — AMLODIPINE BESYLATE 10 MG PO TABS: 10.0000 mg | ORAL_TABLET | ORAL | 3 refills | Status: DC | PRN

## 2021-02-19 NOTE — Patient Instructions (Signed)
DECREASE Amlodipine as needed (if your blood pressure is above 110)  START Torsemide 40mg  (2 tablets) twice daily  STOP Lasix  STOP Zetia, Vitamin D, Sildenafil, Repatha  Please wear your compression hose daily, place them on as soon as you get up in the morning and remove before you go to bed at night.  You have been referred to Palliative care. They will contact you to schedule an appointment  Your physician recommends that you schedule a follow-up appointment in: 1 month  If you have any questions or concerns before your next appointment please send [place] a message through [place] or call our office at [phone].    TO LEAVE A MESSAGE FOR THE NURSE SELECT OPTION 2, PLEASE LEAVE A MESSAGE INCLUDING: . YOUR NAME . DATE OF BIRTH . CALL BACK NUMBER . REASON FOR CALL**this is important as we prioritize the call backs  YOU WILL RECEIVE A CALL BACK THE SAME DAY AS LONG AS YOU CALL BEFORE 4:00 PM

## 2021-02-20 ENCOUNTER — Telehealth: Payer: Self-pay | Admitting: Pharmacist

## 2021-02-20 ENCOUNTER — Encounter (HOSPITAL_COMMUNITY): Payer: Self-pay

## 2021-02-20 DIAGNOSIS — J9601 Acute respiratory failure with hypoxia: Secondary | ICD-10-CM | POA: Diagnosis not present

## 2021-02-20 DIAGNOSIS — M6281 Muscle weakness (generalized): Secondary | ICD-10-CM | POA: Diagnosis not present

## 2021-02-20 DIAGNOSIS — R262 Difficulty in walking, not elsewhere classified: Secondary | ICD-10-CM | POA: Diagnosis not present

## 2021-02-20 DIAGNOSIS — J449 Chronic obstructive pulmonary disease, unspecified: Secondary | ICD-10-CM | POA: Diagnosis not present

## 2021-02-20 NOTE — Progress Notes (Signed)
Referral for Palliative care faxed to [facility] at [phone]. Referral form sent to be scanned into chart

## 2021-02-20 NOTE — Progress Notes (Signed)
Chronic Care Management Pharmacy Assistant   Name: [name]  MRN: [id number] DOB: 07-Aug-1931  Reason for Encounter: Chart Review   PCP : [name], MD  Allergies:   Allergies  Allergen Reactions  . Levaquin [Levofloxacin] Shortness Of Breath and Swelling    angioedema  . Lobster [Shellfish Allergy] Other (See Comments)    angioedema  . Penicillins Rash    Has patient had a PCN reaction causing immediate rash, facial/tongue/throat swelling, SOB or lightheadedness with hypotension: Yes Has patient had a PCN reaction causing severe rash involving mucus membranes or skin necrosis: No Has patient had a PCN reaction that required hospitalization: No Has patient had a PCN reaction occurring within the last 10 years: No If all of the above answers are "NO", then may proceed with Cephalosporin use.  . Valsartan Other (See Comments)    REACTION: angioedema  . Codeine Other (See Comments)    Mental status changes  . Lipitor [Atorvastatin] Other (See Comments)    weakness  . Oxycodone Other (See Comments)    hallucinations  . Statins     Made her too weak  . Tramadol   . Amlodipine Besylate Other (See Comments)    REACTION: tingling in lips & gum edema 7/12: talked to patient, states she is tolerating well  . Crestor [Rosuvastatin Calcium] Rash  . Tramadol Hcl Nausea And Vomiting    Medications: Outpatient Encounter Medications as of 02/20/2021  Medication Sig  . acetaminophen (TYLENOL) 325 MG tablet Take 2 tablets (650 mg total) by mouth every 6 (six) hours as needed for mild pain or headache.  Marland Kitchen amLODipine (NORVASC) 10 MG tablet Take 1 tablet (10 mg total) by mouth as needed (for Systolic BP greater than 283).  . carvedilol (COREG) 6.25 MG tablet TAKE 1 TABLET BY MOUTH  TWICE DAILY WITH MEALS  . clopidogrel (PLAVIX) 75 MG tablet TAKE 1 TABLET BY MOUTH ONCE DAILY WITH BREAKFAST  . ELIQUIS 5 MG TABS tablet TAKE 1 TABLET BY MOUTH  TWICE DAILY  . glucose blood  (ONETOUCH VERIO) test strip USE TO MONITOR GLUCOSE  LEVELS TWICE DAILY  . Insulin Lispro Prot & Lispro (HUMALOG MIX 75/25 KWIKPEN) (75-25) 100 UNIT/ML Kwikpen Inject 18 units into the skin daily with breakfast and dinner; Have Endocrinologist adjust accordingly  . Lancets (ONETOUCH DELICA PLUS [id number]) MISC USE TO MONITOR GLUCOSE  LEVELS TWICE DAILY  . Multiple Vitamins-Minerals (CENTRUM SILVER ULTRA WOMENS PO) Take 1 tablet by mouth daily.  . nitroGLYCERIN (NITROSTAT) 0.4 MG SL tablet Place 1 tablet (0.4 mg total) under the tongue every 5 (five) minutes as needed for chest pain. Reported on 01/28/2016  . NOVOFINE PLUS PEN NEEDLE 32G X 4 MM MISC SMARTSIG:1 Each SUB-Q Daily  . OXYGEN Inhale 3 % into the lungs continuous.  . potassium chloride SA (KLOR-CON) 20 MEQ tablet Take 20 mEq by mouth 2 (two) times daily.  Marland Kitchen torsemide (DEMADEX) 20 MG tablet Take 2 tablets (40 mg total) by mouth 2 (two) times daily.   No facility-administered encounter medications on file as of 02/20/2021.    Current Diagnosis: Patient Active Problem List   Diagnosis Date Noted  . Acute respiratory failure with hypoxia and hypercapnia ([place]) 01/31/2021  . Elevated troponin 01/31/2021  . Type 2 diabetes mellitus with hyperlipidemia ([place]) 01/31/2021  . Neuropathy 08/19/2020  . PAD (peripheral artery disease) ([place]) 02/29/2020  . Chronic kidney disease (CKD) 02/20/2020  . Osteoarthritis 08/01/2019  . Arthritis of left sacroiliac joint  05/04/2017  . Osteopenia 02/17/2017  . Piriformis syndrome of left side 02/16/2017  . Acquired leg length discrepancy 02/16/2017  . COPD GOLD I  01/27/2017  . Lumbar radiculopathy, acute 01/27/2017  . Granuloma annulare 07/27/2016  . Numbness of fingers 07/27/2016  . Primary osteoarthritis of right knee 06/27/2016  . Rash and nonspecific skin eruption 06/09/2016  . Diabetes ([place]) 03/07/2016  . Acute on chronic combined systolic (congestive) and diastolic (congestive) heart failure ([place])    . OSA  08/28/2014  . Chronic diastolic heart failure ([place]) 07/30/2014  . Nocturnal hypoxemia 07/09/2014  . SOB (shortness of breath) 05/15/2014  . Pulmonary HTN ([place]) 05/15/2014  . Band keratopathy 01/16/2014  . 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 Hyperthyroidism 11/04/2011  . Multinodular goiter 10/14/2011  . Atrophy, Fuchs' 09/29/2011  . ATRIAL FIBRILLATION 06/03/2010  . VITILIGO 11/07/2009  . SINOATRIAL NODE DYSFUNCTION 06/16/2009  . Atrial flutter ([place]) 06/25/2008  . HYPERLIPIDEMIA 01/29/2008  . Coronary atherosclerosis 01/29/2008  . HTN (hypertension) 10/30/2007    Goals Addressed   None     Follow-Up:  Pharmacist Review   Reviewed chart for medication changes and adherence.  Patient had office visit with Dr. Quay Burow on 02/16/21 and also with Dr. Haroldine Laws on 02/19/21. Medication changes were made.  No gaps in adherence identified. Patient has follow up scheduled with pharmacy team. No further action required.   Wendy Poet, [place] [phone]

## 2021-02-23 DIAGNOSIS — M6281 Muscle weakness (generalized): Secondary | ICD-10-CM | POA: Diagnosis not present

## 2021-02-23 DIAGNOSIS — R262 Difficulty in walking, not elsewhere classified: Secondary | ICD-10-CM | POA: Diagnosis not present

## 2021-02-23 DIAGNOSIS — J449 Chronic obstructive pulmonary disease, unspecified: Secondary | ICD-10-CM | POA: Diagnosis not present

## 2021-02-23 DIAGNOSIS — J9601 Acute respiratory failure with hypoxia: Secondary | ICD-10-CM | POA: Diagnosis not present

## 2021-02-24 DIAGNOSIS — M6281 Muscle weakness (generalized): Secondary | ICD-10-CM | POA: Diagnosis not present

## 2021-02-24 DIAGNOSIS — J9601 Acute respiratory failure with hypoxia: Secondary | ICD-10-CM | POA: Diagnosis not present

## 2021-02-25 ENCOUNTER — Telehealth: Payer: Medicare Other

## 2021-02-25 ENCOUNTER — Telehealth: Payer: Self-pay | Admitting: *Deleted

## 2021-02-25 ENCOUNTER — Encounter: Payer: Self-pay | Admitting: Internal Medicine

## 2021-02-25 DIAGNOSIS — M6281 Muscle weakness (generalized): Secondary | ICD-10-CM | POA: Diagnosis not present

## 2021-02-25 DIAGNOSIS — R262 Difficulty in walking, not elsewhere classified: Secondary | ICD-10-CM | POA: Diagnosis not present

## 2021-02-25 DIAGNOSIS — J449 Chronic obstructive pulmonary disease, unspecified: Secondary | ICD-10-CM | POA: Diagnosis not present

## 2021-02-25 MED ORDER — AZITHROMYCIN 500 MG PO TABS: 500.0000 mg | ORAL_TABLET | Freq: Once | ORAL | 0 refills | Status: DC

## 2021-02-25 NOTE — Telephone Encounter (Signed)
  Chronic Care Management   Outreach Note  02/25/2021 Name: [name] MRN: [id number] DOB: 1931-04-19  Referred by: [name], MD Reason for referral : Chronic Care Management (DM; CHF; HTN; Unsuccessful follow up attempt)   An unsuccessful telephone outreach was attempted today. The patient was referred to the case management team for assistance with care management and care coordination.   Follow Up Plan:   Telephone follow up appointment with care management team member re-scheduled for: March 03, 2021 at 2:15 pm  The patient has been provided with contact information for the care management team and has been advised to call with any health related questions or concerns.   [name], RN, BSN, [facility] RN Care Coordination- [place] [phone]

## 2021-02-26 DIAGNOSIS — R262 Difficulty in walking, not elsewhere classified: Secondary | ICD-10-CM | POA: Diagnosis not present

## 2021-02-26 DIAGNOSIS — M6281 Muscle weakness (generalized): Secondary | ICD-10-CM | POA: Diagnosis not present

## 2021-02-26 DIAGNOSIS — J9601 Acute respiratory failure with hypoxia: Secondary | ICD-10-CM | POA: Diagnosis not present

## 2021-02-26 DIAGNOSIS — J449 Chronic obstructive pulmonary disease, unspecified: Secondary | ICD-10-CM | POA: Diagnosis not present

## 2021-02-27 DIAGNOSIS — J449 Chronic obstructive pulmonary disease, unspecified: Secondary | ICD-10-CM | POA: Diagnosis not present

## 2021-02-27 DIAGNOSIS — M6281 Muscle weakness (generalized): Secondary | ICD-10-CM | POA: Diagnosis not present

## 2021-02-27 DIAGNOSIS — R262 Difficulty in walking, not elsewhere classified: Secondary | ICD-10-CM | POA: Diagnosis not present

## 2021-03-02 DIAGNOSIS — J449 Chronic obstructive pulmonary disease, unspecified: Secondary | ICD-10-CM | POA: Diagnosis not present

## 2021-03-02 DIAGNOSIS — R262 Difficulty in walking, not elsewhere classified: Secondary | ICD-10-CM | POA: Diagnosis not present

## 2021-03-02 DIAGNOSIS — M6281 Muscle weakness (generalized): Secondary | ICD-10-CM | POA: Diagnosis not present

## 2021-03-03 ENCOUNTER — Ambulatory Visit: Payer: Self-pay | Admitting: *Deleted

## 2021-03-03 ENCOUNTER — Telehealth: Payer: Medicare Other

## 2021-03-03 DIAGNOSIS — J449 Chronic obstructive pulmonary disease, unspecified: Secondary | ICD-10-CM | POA: Diagnosis not present

## 2021-03-03 DIAGNOSIS — R262 Difficulty in walking, not elsewhere classified: Secondary | ICD-10-CM | POA: Diagnosis not present

## 2021-03-03 DIAGNOSIS — M6281 Muscle weakness (generalized): Secondary | ICD-10-CM | POA: Diagnosis not present

## 2021-03-03 NOTE — Chronic Care Management (AMB) (Signed)
  Chronic Care Management   Outreach Note  03/03/2021 Name: TODD ARGABRIGHT MRN: [id number] DOB: 01/25/1931  Referred by: [name], MD Reason for referral : Chronic Care Management (CHF; DM- Unsuccessful second consecutive outreach attempt)  A second (consecutive) unsuccessful telephone outreach was attempted today. The patient was referred to the case management team for assistance with care management and care coordination.   Follow Up Plan:   A HIPAA compliant phone message was left for the patient providing contact information and requesting a return call.   Today's unsuccessful telephone follow up appointment with care management team member re- scheduled for: Tuesday March 10, 2021 at 10:00 am  [name], RN, BSN, [place] [phone]

## 2021-03-03 NOTE — Patient Instructions (Signed)
Visit Information  As a part of your Medicare benefit, you are eligible for care management and care coordination services. I was again unable to reach you by phone today but would like to speak with you about your health related needs. Please call me at [phone] or [phone].  [name], RN, BSN, [facility] RN Care Coordination- [place] [phone]: direct office [phone]: mobile

## 2021-03-04 ENCOUNTER — Encounter (HOSPITAL_COMMUNITY): Payer: Self-pay

## 2021-03-04 DIAGNOSIS — M6281 Muscle weakness (generalized): Secondary | ICD-10-CM | POA: Diagnosis not present

## 2021-03-04 DIAGNOSIS — J449 Chronic obstructive pulmonary disease, unspecified: Secondary | ICD-10-CM | POA: Diagnosis not present

## 2021-03-04 DIAGNOSIS — R262 Difficulty in walking, not elsewhere classified: Secondary | ICD-10-CM | POA: Diagnosis not present

## 2021-03-05 DIAGNOSIS — J9601 Acute respiratory failure with hypoxia: Secondary | ICD-10-CM | POA: Diagnosis not present

## 2021-03-05 DIAGNOSIS — J9602 Acute respiratory failure with hypercapnia: Secondary | ICD-10-CM | POA: Diagnosis not present

## 2021-03-05 DIAGNOSIS — R262 Difficulty in walking, not elsewhere classified: Secondary | ICD-10-CM | POA: Diagnosis not present

## 2021-03-05 DIAGNOSIS — G4733 Obstructive sleep apnea (adult) (pediatric): Secondary | ICD-10-CM | POA: Diagnosis not present

## 2021-03-05 DIAGNOSIS — I5033 Acute on chronic diastolic (congestive) heart failure: Secondary | ICD-10-CM | POA: Diagnosis not present

## 2021-03-05 DIAGNOSIS — J449 Chronic obstructive pulmonary disease, unspecified: Secondary | ICD-10-CM | POA: Diagnosis not present

## 2021-03-05 DIAGNOSIS — M6281 Muscle weakness (generalized): Secondary | ICD-10-CM | POA: Diagnosis not present

## 2021-03-06 DIAGNOSIS — G4733 Obstructive sleep apnea (adult) (pediatric): Secondary | ICD-10-CM | POA: Diagnosis not present

## 2021-03-06 DIAGNOSIS — R262 Difficulty in walking, not elsewhere classified: Secondary | ICD-10-CM | POA: Diagnosis not present

## 2021-03-06 DIAGNOSIS — M6281 Muscle weakness (generalized): Secondary | ICD-10-CM | POA: Diagnosis not present

## 2021-03-06 DIAGNOSIS — J9601 Acute respiratory failure with hypoxia: Secondary | ICD-10-CM | POA: Diagnosis not present

## 2021-03-06 DIAGNOSIS — J9602 Acute respiratory failure with hypercapnia: Secondary | ICD-10-CM | POA: Diagnosis not present

## 2021-03-06 DIAGNOSIS — J449 Chronic obstructive pulmonary disease, unspecified: Secondary | ICD-10-CM | POA: Diagnosis not present

## 2021-03-06 DIAGNOSIS — I5033 Acute on chronic diastolic (congestive) heart failure: Secondary | ICD-10-CM | POA: Diagnosis not present

## 2021-03-09 DIAGNOSIS — R262 Difficulty in walking, not elsewhere classified: Secondary | ICD-10-CM | POA: Diagnosis not present

## 2021-03-09 DIAGNOSIS — M6281 Muscle weakness (generalized): Secondary | ICD-10-CM | POA: Diagnosis not present

## 2021-03-09 DIAGNOSIS — J449 Chronic obstructive pulmonary disease, unspecified: Secondary | ICD-10-CM | POA: Diagnosis not present

## 2021-03-10 ENCOUNTER — Telehealth: Payer: Self-pay

## 2021-03-10 ENCOUNTER — Ambulatory Visit (INDEPENDENT_AMBULATORY_CARE_PROVIDER_SITE_OTHER): Payer: Medicare Other | Admitting: *Deleted

## 2021-03-10 DIAGNOSIS — J9601 Acute respiratory failure with hypoxia: Secondary | ICD-10-CM | POA: Diagnosis not present

## 2021-03-10 DIAGNOSIS — I5032 Chronic diastolic (congestive) heart failure: Secondary | ICD-10-CM

## 2021-03-10 DIAGNOSIS — N1831 Chronic kidney disease, stage 3a: Secondary | ICD-10-CM

## 2021-03-10 DIAGNOSIS — Z794 Long term (current) use of insulin: Secondary | ICD-10-CM

## 2021-03-10 DIAGNOSIS — M6281 Muscle weakness (generalized): Secondary | ICD-10-CM | POA: Diagnosis not present

## 2021-03-10 DIAGNOSIS — E1122 Type 2 diabetes mellitus with diabetic chronic kidney disease: Secondary | ICD-10-CM | POA: Diagnosis not present

## 2021-03-10 NOTE — Telephone Encounter (Signed)
Spoke with patient and scheduled an in-person Palliative Consult for 03/26/21 @ 9AM  COVID screening was negative. No pets in home. Patient lives in [place].   Consent obtained; updated Outlook/Netsmart/Team List and Epic.

## 2021-03-10 NOTE — Chronic Care Management (AMB) (Signed)
Chronic Care Management   CCM RN Visit Note  03/10/2021 Name: [name] MRN: [id number] DOB: 12-21-1931  Subjective: [name] is a 87 y.o. year old female who is a primary care patient of Burns, [name], MD. The care management team was consulted for assistance with disease management and care coordination needs.    Engaged with patient by telephone for follow up visit in response to provider referral for case management and/or care coordination services.   Consent to Services:  The patient was given information about Chronic Care Management services, agreed to services, and gave verbal consent prior to initiation of services.  Please see initial visit note for detailed documentation.   Patient agreed to services and verbal consent obtained.   Assessment: Review of patient past medical history, allergies, medications, health status, including review of consultants reports, laboratory and other test data, was performed as part of comprehensive evaluation and provision of chronic care management services.   CCM Care Plan  Allergies  Allergen Reactions  . Levaquin [Levofloxacin] Shortness Of Breath and Swelling    angioedema  . Lobster [Shellfish Allergy] Other (See Comments)    angioedema  . Penicillins Rash    Has patient had a PCN reaction causing immediate rash, facial/tongue/throat swelling, SOB or lightheadedness with hypotension: Yes Has patient had a PCN reaction causing severe rash involving mucus membranes or skin necrosis: No Has patient had a PCN reaction that required hospitalization: No Has patient had a PCN reaction occurring within the last 10 years: No If all of the above answers are "NO", then may proceed with Cephalosporin use.  . Valsartan Other (See Comments)    REACTION: angioedema  . Codeine Other (See Comments)    Mental status changes  . Lipitor [Atorvastatin] Other (See Comments)    weakness  . Oxycodone Other (See Comments)     hallucinations  . Statins     Made her too weak  . Tramadol   . Amlodipine Besylate Other (See Comments)    REACTION: tingling in lips & gum edema 7/12: talked to patient, states she is tolerating well  . Crestor [Rosuvastatin Calcium] Rash  . Tramadol Hcl Nausea And Vomiting    Outpatient Encounter Medications as of 03/10/2021  Medication Sig Note  . acetaminophen (TYLENOL) 325 MG tablet Take 2 tablets (650 mg total) by mouth every 6 (six) hours as needed for mild pain or headache.   Marland Kitchen amLODipine (NORVASC) 10 MG tablet Take 1 tablet (10 mg total) by mouth as needed (for Systolic BP greater than 578). 03/10/2021: 03/10/21: Patient reports this was changed to "prn" at time of CHF visit on 02/19/21- reports has not needed/ has not taken recently; reports has taken one time since this change was made - reports blood pressures under good control at home  . carvedilol (COREG) 6.25 MG tablet TAKE 1 TABLET BY MOUTH  TWICE DAILY WITH MEALS   . ELIQUIS 5 MG TABS tablet TAKE 1 TABLET BY MOUTH  TWICE DAILY   . Insulin Lispro Prot & Lispro (HUMALOG MIX 75/25 KWIKPEN) (75-25) 100 UNIT/ML Kwikpen Inject 18 units into the skin daily with breakfast and dinner; Have Endocrinologist adjust accordingly   . Multiple Vitamins-Minerals (CENTRUM SILVER ULTRA WOMENS PO) Take 1 tablet by mouth daily.   . nitroGLYCERIN (NITROSTAT) 0.4 MG SL tablet Place 1 tablet (0.4 mg total) under the tongue every 5 (five) minutes as needed for chest pain. Reported on 01/28/2016 03/10/2021: 03/10/21: Reports has not needed recently  .  OXYGEN Inhale 3 % into the lungs continuous.   . potassium chloride SA (KLOR-CON) 20 MEQ tablet Take 20 mEq by mouth 2 (two) times daily.   Marland Kitchen torsemide (DEMADEX) 20 MG tablet Take 2 tablets (40 mg total) by mouth 2 (two) times daily.   . clopidogrel (PLAVIX) 75 MG tablet TAKE 1 TABLET BY MOUTH ONCE DAILY WITH BREAKFAST (Patient not taking: Reported on 03/10/2021)   . glucose blood (ONETOUCH VERIO) test strip  USE TO MONITOR GLUCOSE  LEVELS TWICE DAILY   . Lancets (ONETOUCH DELICA PLUS [id number]) MISC USE TO MONITOR GLUCOSE  LEVELS TWICE DAILY   . NOVOFINE PLUS PEN NEEDLE 32G X 4 MM MISC SMARTSIG:1 Each SUB-Q Daily    No facility-administered encounter medications on file as of 03/10/2021.    Patient Active Problem List   Diagnosis Date Noted  . Acute respiratory failure with hypoxia and hypercapnia ([place]) 01/31/2021  . Elevated troponin 01/31/2021  . Type 2 diabetes mellitus with hyperlipidemia ([place]) 01/31/2021  . Neuropathy 08/19/2020  . PAD (peripheral artery disease) ([place]) 02/29/2020  . Chronic kidney disease (CKD) 02/20/2020  . Osteoarthritis 08/01/2019  . Arthritis of left sacroiliac joint 05/04/2017  . Osteopenia 02/17/2017  . Piriformis syndrome of left side 02/16/2017  . Acquired leg length discrepancy 02/16/2017  . COPD GOLD I  01/27/2017  . Lumbar radiculopathy, acute 01/27/2017  . Granuloma annulare 07/27/2016  . Numbness of fingers 07/27/2016  . Primary osteoarthritis of right knee 06/27/2016  . Rash and nonspecific skin eruption 06/09/2016  . Diabetes ([place]) 03/07/2016  . Acute on chronic combined systolic (congestive) and diastolic (congestive) heart failure ([place])   . OSA  08/28/2014  . Chronic diastolic heart failure ([place]) 07/30/2014  . Nocturnal hypoxemia 07/09/2014  . SOB (shortness of breath) 05/15/2014  . Pulmonary HTN ([place]) 05/15/2014  . Band keratopathy 01/16/2014  . 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 Hyperthyroidism 11/04/2011  . Multinodular goiter 10/14/2011  . Atrophy, Fuchs' 09/29/2011  . ATRIAL FIBRILLATION 06/03/2010  . VITILIGO 11/07/2009  . SINOATRIAL NODE DYSFUNCTION 06/16/2009  . Atrial flutter ([place]) 06/25/2008  . HYPERLIPIDEMIA 01/29/2008  . Coronary atherosclerosis 01/29/2008  . HTN (hypertension) 10/30/2007    Conditions to be addressed/monitored:CHF and DMII  Care Plan : Heart Failure (Adult)  Updates made by Knox Royalty, RN  since 03/10/2021 12:00 AM    Problem: Symptom Exacerbation (Heart Failure)   Priority: High    Long-Range Goal: Symptom Exacerbation Prevented or Minimized   Start Date: 02/17/2021  Expected End Date: 05/18/2021  This Visit's Progress: On track  Recent Progress: On track  Priority: High  Note:   Current Barriers:  Marland Kitchen Knowledge deficits related to use of new home Oxygen and need for ongoing reinforcement of basic heart failure pathophysiology and self care management . Unable to independently verbalize long-term plan for use of home O2, newly prescribed Nurse Case Manager Clinical Goal(s):   Over the next 90 days, patient will weigh self daily and record  Over the next 90 days, patient will verbalize understanding of Heart Failure Action Plan and when to call doctor  Over the next 90 days, patient will verbalize plan for ongoing use of oxygen at home  Over the next 30 days, patient will not experience hospital re-admission for CHF exacerbation: GOAL MET- patient has been 33 days post last hospital discharge, without unplanned re-admission Interventions:  . Collaboration with [name], MD regarding development and update of comprehensive plan of care as evidenced by provider  attestation and co-signature . Inter-disciplinary care team collaboration (see longitudinal plan of care) . Basic overview and discussion of pathophysiology of Heart Failure . Discussed current clinical condition with patient and confirmed she has no current clinical concerns . Reviewed recent CHF clinic visit 02/19/21 with patient: confirmed that she is able to independently/ accurately verbalize recent changes to medications post- office visit . Confirmed no medication concerns; patient continues to independently self manage; provided education regarding safe use of NTG and confirmed patient has not needed recently . Confirmed patient continues to monitor and record daily weight at home: reviewed daily weights  with patient, who reports consistent ranges between: 159-161 lbs . Confirmed patient remains active with [facility]OT at [facility] . Confirmed patient continues using home O2 and is slowly working to wean home O2, as indicated around clinical progress: continues using regularly at home between 2-3 L/min . Confirmed patient is wearing newly prescribed compression stockings- reports greatly helping with lower extremity swelling- reports minimal swelling since she has started wearing: positive reinforcement provided . Care Coordination Outreach via 3-way call with patient on line: contacted [facility] team to facilitate initiation of referral placed 02/19/21 during CHF clinic office visit; spoke with Erline Levine who will reach out to CHF clinic provider to obtain updated referral information to process referral; ensured that patient has contact information for Palliative Care team Patient Goals/Self-Care Activities . Continue using home oxygen as prescribed; keep Dr. Haroldine Laws updated on your conservative efforts to slowly decrease your use of home O2 . Consider re-filling your prescription for NTG as we discussed today, to be sure that the medication is potent, since it has been awhile since you last had this filled; remember- if you ever have to use this medication, you should do so while seated or lying down: this medication can lower your blood pressure significantly . Continue to monitor your oxygen levels regularly at home: with activity and at rest, and if you ever start to feel unusually short of breath . Continue to weigh daily: you reported weights at home today between 159-161; keep up the great work tracking weight in your health journal . call office if I gain more than 2 pounds in one day or 5 pounds in one week . use salt in moderation and continue to monitor the amount of fluid you take in every day . watch for swelling in feet, ankles and legs every day . Today, we contacted the [facility] Team: they should be contacting you soon to schedule an appointment for the nurse practitioner to visit you; if you need to contact them again, the number is: [phone]- option #2 . Your next appointment with the heart failure clinic (Dr. Haroldine Laws) is scheduled for 03/19/21 at 11:30 am Follow Up Plan:  . Telephone follow up appointment with care management team member scheduled for: 04/14/21 at 10:00 am . The patient has been provided with contact information for the care management team and has been advised to call with any health related questions or concerns.     Care Plan : Diabetes Type 2 (Adult)  Updates made by Knox Royalty, RN since 03/10/2021 12:00 AM    Problem: Glycemic Management (Diabetes, Type 2)   Priority: High    Long-Range Goal: Glycemic Management Optimized   Start Date: 02/17/2021  Expected End Date: 05/18/2021  This Visit's Progress: On track  Recent Progress: On track  Priority: High  Note:   Objective:  Lab Results  Component Value Date  HGBA1C 9.5 (A) 12/02/2020 .   Lab Results  Component Value Date   CREATININE 0.84 02/05/2021   CREATININE 0.79 02/04/2021   CREATININE 0.84 02/03/2021 .   Marland Kitchen No results found for: EGFR Current Barriers:  Marland Kitchen Knowledge Deficits related to basic Diabetes pathophysiology and self care/management: patient could benefit from ongoing reinforcement of self-health management strategies for DM . Knowledge Deficits related to medications used for management of diabetes- patient actively engaged with CCM Pharmacist at PCP office . No self-care deficits: patient resides at independent living facility and has support if needed; patient verbalizes accurate general understanding of plan of care post- recent hospital discharge 02/05/21 Case Manager Clinical Goal(s):  Over the next 90 days, patient will demonstrate improved adherence to prescribed treatment plan for diabetes self care/management as evidenced by:  . daily  monitoring and recording of CBG twice daily  . adherence to ADA/ carb modified diet  . Light exercise with established PT at [facility] facility 4-5 days/week  . adherence to prescribed medication regimen  . contacting care providers for new or worsened symptoms, concerns or questions Interventions:  . Collaboration with [name], MD regarding development and update of comprehensive plan of care as evidenced by provider attestation and co-signature . Inter-disciplinary care team collaboration (see longitudinal plan of care) . Provided education to patient about basic DM disease process and diet management . Reviewed recent fasting blood sugars with patient: she reports ranges at home consistently between 120-130: positive reinforcement provided . Encouraged patient to consider occasionally monitoring post-prandial blood sugar values at home, as well as (currently) monitored fasting values . Confirmed no recent changes to medication and that patient continues to independently self-manage insulin: able to accurately verbalize insulin dosing . Discussed plans with patient for ongoing care management follow up and provided patient with direct contact information for care management team . Advised patient, providing education and rationale, to continue monitoring and recording blood sugars twice/ day at home, calling care providers for any concerns or questions . Review of patient status, including review of consultants reports, relevant laboratory and other test results, and medications completed. Patient Goals: Please continue to:  . check blood sugar at prescribed times . check blood sugar if I feel it is too high or too low . enter blood sugar readings and medication or insulin into daily log . take the blood sugar log to all doctor visits-- you have an appointment with Dr. Loanne Drilling on March 24, 2021 at 10:15 am . adherence to ADA/ carb modified diet  . Light exercise with  established PT at [facility] 4-5 days/week: great job staying active with your PT/OT team- keep up the great work  . adherence to prescribed medication regimen  . contacting care providers for new or worsened symptoms, concerns or questions Self-Care Activities . Self administers oral medications as prescribed . Self administers insulin as prescribed . Attends all scheduled provider appointments . Checks blood sugars as prescribed . Adheres to prescribed ADA/carb modified and is motivated to ongoing learning around same Follow Up Plan:  . Telephone follow up appointment with care management team member scheduled for: 04/14/21 at 10:00 am . The patient has been provided with contact information for the care management team and has been advised to call with any health related questions or concerns.       Plan:  Telephone follow up appointment with care management team member scheduled for: April 14, 2021 at 10:00 am  The patient has been  provided with contact information for the care management team and has been advised to call with any health related questions or concerns.   [name], RN, BSN, [facility] RN Care Coordination- [place] [phone]: direct office [phone]: mobile

## 2021-03-10 NOTE — Patient Instructions (Signed)
Visit Akins, it was nice talking with you today- you are making great progress in your recuperation after your recent hospital visit!    Please read over the attached information, and and don't hesitate to contact me if I can be of assistance to you before our next scheduled appointment.  I look forward to talking to you again for an update on Tuesday, April 14, 2021 at 10:00 am; please be listening out for my call that day.  I will call as close to 10:00 am as possible; I look forward to hearing about your progress.   [name], RN, BSN, [facility] RN Care Coordination- [place] [phone]: direct office [phone]: mobile   '  PATIENT GOALS: Goals Addressed            This Visit's Progress   . Monitor and Manage My Blood Sugar-Diabetes Type 2   On track    Timeframe:  Long-Range Goal Priority:  High Start Date:       02/17/21                      Expected End Date:       05/18/21                Follow Up Date 04/14/21  Doristine Devoid job getting your blood sugars back under control after your February 2022 hospital visit! Please continue to:    . check blood sugar at prescribed times . check blood sugar if I feel it is too high or too low . enter blood sugar readings and medication or insulin into daily log . take the blood sugar log to all doctor visits-- you have an appointment with Dr. Loanne Drilling on March 24, 2021 at 10:15 am . adherence to ADA/ carb modified diet  . Light exercise with established PT at [facility] 4-5 days/week: great job staying active with your PT/OT team- keep up the great work  . adherence to prescribed medication regimen  . contacting care providers for new or worsened symptoms, concerns or questions   Why is this important?    Checking your blood sugar at home helps to keep it from getting very high or very low.   Writing the results in a diary or log helps the doctor know how to care  for you.   Your blood sugar log should have the time, date and the results.   Also, write down the amount of insulin or other medicine that you take.   Other information, like what you ate, exercise done and how you were feeling, will also be helpful.         . Track and Manage Fluids and Swelling-Heart Failure   On track    Timeframe:  Long-Range Goal Priority:  High Start Date:         02/17/21                    Expected End Date:       05/18/21                Follow Up Date 04/14/21   . Continue using home oxygen as prescribed; keep Dr. Haroldine Laws updated on your conservative efforts to slowly decrease your use of home O2 . Consider re-filling your prescription for NTG as we discussed today, to be sure that the medication is potent, since it has been awhile since you last had this  filled; remember- if you ever have to use this medication, you should do so while seated or lying down: this medication can lower your blood pressure significantly . Continue to monitor your oxygen levels regularly at home: with activity and at rest, and if you ever start to feel unusually short of breath . Continue to weigh daily: you reported weights at home today between 159-161; keep up the great work tracking weight in your health journal . call office if I gain more than 2 pounds in one day or 5 pounds in one week . use salt in moderation and continue to monitor the amount of fluid you take in every day . watch for swelling in feet, ankles and legs every day . Today, we contacted the [facility] Team: they should be contacting you soon to schedule an appointment for the nurse practitioner to visit you; if you need to contact them again, the number is: [phone]- option #2    Why is this important?    It is important to check your weight daily and watch how much salt and liquids you have.   It will help you to manage your heart failure.          Palliative Care Palliative care  helps to improve the quality of life for people coping with serious and life-threatening illnesses. It involves care of the body, mind, and spirit. Palliative care services are different for each person and are based on the person's needs and preferences. Services often occur in the hospital or in a long-term care setting. Palliative care requires a team of professionals and loved ones. Services include:  Steps to control pain and other symptoms and to provide comfort.  Family support.  Spiritual support.  Emotional and social support. Palliative care is a way to bring organized services to a person for comfort and peace of mind. It can be helpful to a person and his or her family and friends during the course of an illness. What is the difference between palliative care and hospice? Palliative care and hospice have similar goals. They both aim to manage symptoms, provide comfort, improve quality of life, and maintain a person's dignity. The difference is that palliative care can be offered during any phase of a serious illness, from diagnosis to cure. Hospice care is usually offered at the end of life, often when a person is expected to live for 6 months or less. Who can receive palliative care services? Palliative care is offered to children and adults who are seriously ill. It is often offered when a person:  Is not responding well to treatment.  Needs pain management.  Has side effects from treatment, such as chemotherapy, that are difficult to manage.  Has symptoms from the effects of surgery.  Has a diagnosis of an advanced disease, or a disease that shortens his or her life. A health care provider will usually recommend palliative care services when more support would be helpful. Family members and friends may also receive palliative care services to help manage stress and other concerns. Who makes up the palliative care team? The following people make up a palliative care  team:  The person receiving care and his or her family.  Physicians, including primary health care providers and specialists.  Nurses.  A Education officer, museum, psychologist, or psychiatrist. Depending on one's needs, the following people may also be included on a palliative care team:  A pain specialist.  A hospice specialist.  A financial or insurance  Optometrist.  Religious or spiritual leaders.  A care coordinator or case manager.  A bereavement coordinator. The team will speak with the person and his or her family about:  The person's physical symptoms, such as pain, nausea, vomiting, shortness of breath, and the need for a pain specialist and hospice specialist.  Life and death as a normal process.  Advance directives or living wills, health care proxies, and end-of-life care.  Stress, depression, and anxiety symptoms.  Treatment options and how to maintain as much function and mobility as possible.  Spiritual wishes, such as rituals and prayer.  [employer] and [employer] activities. The palliative care team helps a person talk about difficult issues and will address spiritual and emotional concerns.   Where to find more information  [employer] on Aging: http://kim-miller.com/  National Hospice and Palliative Care Organization: http://www.brown-buchanan.com/ Summary  Palliative care helps to improve the quality of life for people coping with serious and life-threatening illnesses. It involves care of the body, mind, and spirit.  The specific services are different for each person and are based on the person's needs and preferences.  Palliative care is a way to bring comfort and peace of mind to a person and his or her family. This information is not intended to replace advice given to you by your health care provider. Make sure you discuss any questions you have with your health care provider. Document Revised: 09/16/2020 Document Reviewed: 09/16/2020 Elsevier Patient Education  2021  [place].   Patient verbalizes understanding of instructions provided today and agrees to view in [place].    Telephone follow up appointment with care management team member scheduled for: 04/14/21 at 10:00 am  The patient has been provided with contact information for the care management team and has been advised to call with any health related questions or concerns.   [name], RN, BSN, [facility] RN Care Coordination- [place] [phone]: direct office [phone]: mobile

## 2021-03-12 ENCOUNTER — Telehealth: Payer: Self-pay | Admitting: Internal Medicine

## 2021-03-12 DIAGNOSIS — R262 Difficulty in walking, not elsewhere classified: Secondary | ICD-10-CM | POA: Diagnosis not present

## 2021-03-12 DIAGNOSIS — J449 Chronic obstructive pulmonary disease, unspecified: Secondary | ICD-10-CM | POA: Diagnosis not present

## 2021-03-12 DIAGNOSIS — J9601 Acute respiratory failure with hypoxia: Secondary | ICD-10-CM | POA: Diagnosis not present

## 2021-03-12 DIAGNOSIS — M6281 Muscle weakness (generalized): Secondary | ICD-10-CM | POA: Diagnosis not present

## 2021-03-12 NOTE — Telephone Encounter (Signed)
LVM for pt to rtn my call to schedule awv with nha.. Please schedule AWV if pt calls the office.  

## 2021-03-13 DIAGNOSIS — M6281 Muscle weakness (generalized): Secondary | ICD-10-CM | POA: Diagnosis not present

## 2021-03-13 DIAGNOSIS — J9601 Acute respiratory failure with hypoxia: Secondary | ICD-10-CM | POA: Diagnosis not present

## 2021-03-16 DIAGNOSIS — J449 Chronic obstructive pulmonary disease, unspecified: Secondary | ICD-10-CM | POA: Diagnosis not present

## 2021-03-16 DIAGNOSIS — R262 Difficulty in walking, not elsewhere classified: Secondary | ICD-10-CM | POA: Diagnosis not present

## 2021-03-16 DIAGNOSIS — M6281 Muscle weakness (generalized): Secondary | ICD-10-CM | POA: Diagnosis not present

## 2021-03-17 DIAGNOSIS — J9601 Acute respiratory failure with hypoxia: Secondary | ICD-10-CM | POA: Diagnosis not present

## 2021-03-17 DIAGNOSIS — M6281 Muscle weakness (generalized): Secondary | ICD-10-CM | POA: Diagnosis not present

## 2021-03-18 ENCOUNTER — Telehealth: Payer: Self-pay | Admitting: Internal Medicine

## 2021-03-18 DIAGNOSIS — J449 Chronic obstructive pulmonary disease, unspecified: Secondary | ICD-10-CM | POA: Diagnosis not present

## 2021-03-18 DIAGNOSIS — M6281 Muscle weakness (generalized): Secondary | ICD-10-CM | POA: Diagnosis not present

## 2021-03-18 DIAGNOSIS — R262 Difficulty in walking, not elsewhere classified: Secondary | ICD-10-CM | POA: Diagnosis not present

## 2021-03-18 NOTE — Telephone Encounter (Signed)
I recall signing something for her last week - do not recall what it was.

## 2021-03-18 NOTE — Telephone Encounter (Signed)
Paper work refaxed again today.

## 2021-03-18 NOTE — Progress Notes (Signed)
Advanced Heart Failure Clinic Note   Date:  03/19/2021   ID:  [name], DOB 02-20-1931, MRN [id number]  Location: Home  Provider location: [facility] Type of Visit: Established patient  PCP:  [name], MD  Cardiologist:  Pixie Casino, MD Primary HF: [place]  Chief Complaint: Heart Failure follow-up   History of Present Illness: [name] is a 87 y.o. female with CAD s/p 1v stent, HTN, atrial fibrillation/A flutter and chronic prior systolic heart failure, (which was likely rate related) and diastolic heart failure. She was referred by Dr. Lovena Le for further evaluation of Pulmonary HTN.   Underwent 2D echo in 3/15 which showed EF 60-65% mild LVH. RV was normal. No significant valvular abnormalities.   Underwent R/L cath 05/07/14 Which showed stable CAD and significant PH with normal PVR.   In 4/21 saw Dr. Scot Dock and had LE angio with severe PAD underwent stenting of 70% R external iliac  Admitted 05/21/17-05/27/17 with COPD exacerbation, acute on chronic diastolic CHF. Diuresed 5L with IV Lasix. Discharge weight 173 pounds.  Myoview 9/20   Nuclear stress EF: 56%.  There was no ST segment deviation noted during stress.  Defect 1: There is a medium defect of moderate severity present in the mid inferolateral, apical inferior and apical lateral location.  Findings consistent with possible mild ischemia in the apical inferior and inferolateral regions.  The left ventricular ejection fraction is normal (55-65%).    Admitted in 5/21 with NSTEMI hstrop peak 1,455. Echo 5/21: EF 50-55%  Cath 05/16/20 with  1. Severe 3 vessel obstructive CAD 2. Severe pulmonary HTN 3. Successful PCI of the proximal LCx/OM1 with DES x 1 with IVUS guidance 4. Successful PCI of the proximal LAD with orbital atherectomy and DES x 1  RA = 6 RV = 75/6 PA = 77/26 (45) PCW = 10 Fick cardiac output/index = 4.0/2.2 PVR = 8.8 WU FA sat =  99% PA sat = 67%, 73%  Admitted 2/22 with volume overload. Diuresed. Repeat RHC on 02/04/21 as below. Diuresed and started on sildenafil. D/c weight 167  RA = 6 RV = 88/5 PA =  86/33 (53) PCW = 21 Fick cardiac output/index = 3.4/1.9 PVR = 9.4 WU Ao sat = 97% PA sat = 64%, 64% PAPi = 8.8  Saw Dr Haroldine Laws a month ago and at that time some medications were stopped and GOLD DNR form completed. Referred to Palliative Care.    Today she returns for HF follow up with her daughtet.Overall feeling fine. Denies SOB/PND/Orthopnea. Appetite ok. She often eats high sodium foods provided [facility] the [facility]. No fever or chills. BP at home 130-140.  Remains on 2-3 liters with oxygen  saturations <88% on a few occasions with exertion. Uses CPAP nightly. Weight at home has been stable. She manages her own medications. Lives in  [place], [place]. .    Past Medical History:  Diagnosis Date  . Anemia    iron defficiency  . Anginal pain ([place])   . Arthritis    "knees, feet, hands; joints" (05/27/2015)  . Asthma   . Atrial fibrillation or flutter    s/p RFCA 7/08;   s/p DCCV in past;   previously on amiodarone;  amio stopped due to lung toxicity  . CAD (coronary artery disease)    s/p NSTEMI tx with BMS to OM1 3/08;  cath 3/08: pOM 99% tx with PCI, pLAD 20%, ? mod  stenosis at the AM  . CAP (community acquired pneumonia) 05/24/2015  . CHF (congestive heart failure) ([place])   . Chronic diastolic heart failure (HCC)    echo 11/11:  EF 55-60%, severe LVH, mod LAE, mild MR, mildly increased PASP  . COPD (chronic obstructive pulmonary disease) ([place])   . Degenerative joint disease   . Dystrophy, corneal stromal   . Heart murmur   . HLD (hyperlipidemia)   . HTN (hypertension)    essential nos  . Hypopotassemia    PMH of  . Muscle pain   . Myocardial infarction ([place]) 2008  . OSA on CPAP   . Osteoporosis   . Pneumonia 05/27/2015  . Protein calorie malnutrition ([place])   . Rash and  nonspecific skin eruption    both arms,awaiting bio   dr Delman Cheadle  . Seasonal allergies   . Shortness of breath dyspnea   . Type II diabetes mellitus ([place])    Dr Loanne Drilling   Past Surgical History:  Procedure Laterality Date  . A FLUTTER ABLATION     Dr Lovena Le  . ABDOMINAL AORTOGRAM W/LOWER EXTREMITY Bilateral 04/11/2020   Procedure: ABDOMINAL AORTOGRAM W/LOWER EXTREMITY;  Surgeon: Angelia Mould, MD;  Location: [facility];  Service: Cardiovascular;  Laterality: Bilateral;  . CATARACT EXTRACTION W/ INTRAOCULAR LENS  IMPLANT, BILATERAL Bilateral   . CHOLECYSTECTOMY    . COLONOSCOPY  6/07   2 polyps Dr. Kinnie Feil in [place]  . colonoscopy with polypectomy      X 2; [facility]  . CORNEAL TRANSPLANT Bilateral   . CORONARY ANGIOPLASTY WITH STENT PLACEMENT  02/2007   BMS w/Dr Lovena Le  . CORONARY ATHERECTOMY N/A 05/16/2020   Procedure: CORONARY ATHERECTOMY;  Surgeon: Martinique, Peter M, MD;  Location: [facility];  Service: Cardiovascular;  Laterality: N/A;  . CORONARY BALLOON ANGIOPLASTY N/A 05/16/2020   Procedure: CORONARY BALLOON ANGIOPLASTY;  Surgeon: Martinique, Peter M, MD;  Location: [facility];  Service: Cardiovascular;  Laterality: N/A;  . CORONARY STENT INTERVENTION N/A 05/16/2020   Procedure: CORONARY STENT INTERVENTION;  Surgeon: Martinique, Peter M, MD;  Location: [facility];  Service: Cardiovascular;  Laterality: N/A;  . EYE SURGERY    . gravid 2 para 2    . INTRAVASCULAR ULTRASOUND/IVUS N/A 05/16/2020   Procedure: Intravascular Ultrasound/IVUS;  Surgeon: Martinique, Peter M, MD;  Location: [facility];  Service: Cardiovascular;  Laterality: N/A;  . KNEE CARTILAGE SURGERY Right   . LEFT AND RIGHT HEART CATHETERIZATION WITH CORONARY ANGIOGRAM N/A 05/07/2014   Procedure: LEFT AND RIGHT HEART CATHETERIZATION WITH CORONARY ANGIOGRAM;  Surgeon: Peter M Martinique, MD;  Location: [facility];  Service: Cardiovascular;  Laterality: N/A;  . PERIPHERAL VASCULAR INTERVENTION   04/11/2020   Procedure: PERIPHERAL VASCULAR INTERVENTION;  Surgeon: Angelia Mould, MD;  Location: [facility];  Service: Cardiovascular;;  . RIGHT HEART CATH N/A 02/04/2021   Procedure: RIGHT HEART CATH;  Surgeon: Jolaine Artist, MD;  Location: [facility];  Service: Cardiovascular;  Laterality: N/A;  . RIGHT/LEFT HEART CATH AND CORONARY ANGIOGRAPHY N/A 05/16/2020   Procedure: RIGHT/LEFT HEART CATH AND CORONARY ANGIOGRAPHY;  Surgeon: Jolaine Artist, MD;  Location: [facility];  Service: Cardiovascular;  Laterality: N/A;  . TOTAL KNEE ARTHROPLASTY Right 06/28/2016   Procedure: TOTAL KNEE ARTHROPLASTY;  Surgeon: Frederik Pear, MD;  Location: [facility];  Service: Orthopedics;  Laterality: Right;     Current Outpatient Medications  Medication Sig Dispense Refill  .  acetaminophen (TYLENOL) 325 MG tablet Take 2 tablets (650 mg total) by mouth every 6 (six) hours as needed for mild pain or headache. 20 tablet 0  . carvedilol (COREG) 6.25 MG tablet TAKE 1 TABLET BY MOUTH  TWICE DAILY WITH MEALS 180 tablet 3  . clopidogrel (PLAVIX) 75 MG tablet TAKE 1 TABLET BY MOUTH ONCE DAILY WITH BREAKFAST 90 tablet 3  . ELIQUIS 5 MG TABS tablet TAKE 1 TABLET BY MOUTH  TWICE DAILY 60 tablet 11  . glucose blood (ONETOUCH VERIO) test strip USE TO MONITOR GLUCOSE  LEVELS TWICE DAILY 200 strip 3  . Insulin Lispro Prot & Lispro (HUMALOG MIX 75/25 KWIKPEN) (75-25) 100 UNIT/ML Kwikpen Inject 18 units into the skin daily with breakfast and dinner; Have Endocrinologist adjust accordingly 15 mL 11  . Lancets (ONETOUCH DELICA PLUS [id number]) MISC USE TO MONITOR GLUCOSE  LEVELS TWICE DAILY 200 each 3  . Multiple Vitamins-Minerals (CENTRUM SILVER ULTRA WOMENS PO) Take 1 tablet by mouth daily.    . nitroGLYCERIN (NITROSTAT) 0.4 MG SL tablet Place 1 tablet (0.4 mg total) under the tongue every 5 (five) minutes as needed for chest pain. Reported on 01/28/2016 30 tablet 3  . NOVOFINE PLUS PEN NEEDLE 32G X 4  MM MISC SMARTSIG:1 Each SUB-Q Daily    . OXYGEN Inhale 3 % into the lungs continuous.    . potassium chloride SA (KLOR-CON) 20 MEQ tablet Take 20 mEq by mouth 2 (two) times daily.    Marland Kitchen torsemide (DEMADEX) 20 MG tablet Take 2 tablets (40 mg total) by mouth 2 (two) times daily. 120 tablet 3  . amLODipine (NORVASC) 10 MG tablet Take 1 tablet (10 mg total) by mouth as needed (for Systolic BP greater than 469). (Patient not taking: Reported on 03/19/2021) 30 tablet 3   No current facility-administered medications for this encounter.    Allergies:   Levaquin [levofloxacin], Lobster [shellfish allergy], Penicillins, Valsartan, Codeine, Lipitor [atorvastatin], Oxycodone, Statins, Tramadol, Amlodipine besylate, Crestor [rosuvastatin calcium], and Tramadol hcl   Social History:  The patient  reports that she quit smoking about 52 years ago. Her smoking use included cigarettes. She has a 4.50 pack-year smoking history. She has never used smokeless tobacco. She reports current alcohol use. She reports that she does not use drugs.   Family History:  The patient's family history includes Arthritis in her father, maternal aunt, and mother; Breast cancer in her maternal aunt; Diabetes in her mother; Heart attack (age of onset: 61) in her father; Hypertension in her father and mother; Transient ischemic attack in her mother.   ROS:  Please see the history of present illness.   All other systems are personally reviewed and negative.   Vitals:   03/19/21 1150  BP: (!) 182/80  Pulse: 98  SpO2: 95%  Weight: 76.2 kg (168 lb)   Wt Readings from Last 3 Encounters:  03/19/21 76.2 kg  02/19/21 76 kg  02/16/21 75.3 kg     Exam:   General: Walked in the clinic with a rolling walker. No resp difficulty HEENT: normal Neck: supple. JVP 6-7 . Carotids 2+ bilat; no bruits. No lymphadenopathy or thryomegaly appreciated. Cor: PMI nondisplaced. Regular rate & rhythm. No rubs, gallops or murmurs. Lungs: clear on 2  liters  Abdomen: soft, nontender, nondistended. No hepatosplenomegaly. No bruits or masses. Good bowel sounds. Extremities: no cyanosis, clubbing, rash, edema. Slight edema in her feet.  Neuro: alert & orientedx3, cranial nerves grossly intact. moves all 4 extremities w/o difficulty. Affect  pleasant Recent Labs: 05/15/2020: TSH 0.866 01/30/2021: B Natriuretic Peptide 171.8 02/05/2021: ALT 18; BUN 16; Creatinine, Ser 0.84; Hemoglobin 14.7; Magnesium 1.8; Platelets 156; Potassium 3.7; Sodium 139  Personally reviewed   Wt Readings from Last 3 Encounters:  03/19/21 76.2 kg (168 lb)  02/19/21 76 kg (167 lb 9.6 oz)  02/16/21 75.3 kg (166 lb)     ASSESSMENT AND PLAN:   1) Chronic diastolic CHF:  - Echo 08/7988 EF 60-65%, LA severely dilated, Echo 10/2017: EF 65-70%, grade 3 DD - Echo 5/21 EF 50-55% - Echo 2/22 EF 40-45% - NYHA II. Seems to be active at the [facility]  Volume status torsemide 40 bid - Not on Entresto due to h/o angioedema with ARB - Not interested in further med titration.   2) CAD - H/o of stent in 2008 - NSTEMI 5/21. Cath as above. S/p PCI to LAD & OM - No chest pain.  - On Plavix, apixaban. Give samples of apixban today.   3) PAD - severe  - s/p stenting to R external iliac with Dr. Scot Dock - Continues with mild claudication   4) PAH - Moderate to severe by cath 5/21 and 2/22.  - suspect multifactorial  - wearing CPAP - sildenafil started 2/22 having trouble with side effetcs  5) Paroxysmal atrial fib/flutter - Rate controlled.  This patients CHA2DS2-VASc Score and unadjusted Ischemic Stroke Rate (% per year) is equal to 7.2 % stroke rate/year from a score of 5 Above score calculated as 2 points each if present [Age > 75, or Stroke/TIA/TE] - Continue Eliquis. No bleeding  6) OSA/COPD - Follows with Dr. Halford Chessman.  - Continue oxygen to maintain oxygen saturations > 88%.  - Continue CPAP nightly.   7) HTN - Elevated. Last visit amlodipine was changed  to as needed. Today I will add the lowest dose 2.5 mg amlodipine daily.   8) DM2 - Stopped Jardiance due to cost  - No change  9) HL  - Unable to tolerate statins. She stopped zetia/repatha.   10) End of life planning -> DNR/DNI -  Gold Form for DNR/DNI has been completed.  - She has contacted Palliative Care for cnsult.illed out - Last visit wit Dr Haroldine Laws multiple non-essential meds including: amlodipine, vitamin D, zetia, repatha, sildenafil   Follow up with Dr Haroldine Laws in 3 months. Greater than 50% of the (total minutes 25) visit spent in counseling/coordination of care regarding the above.   Jeanmarie Hubert, NP  03/19/2021 11:57 AM  Advanced Heart Failure [place] [address] [phone] (office) [phone] (fax)

## 2021-03-18 NOTE — Telephone Encounter (Signed)
[name] has called from [place] saying she sent over a fax for a Medical necessity form on 3.16.22 and never got anything back.   She will refax the forms today.  [name] Phone: [phone] Fax: [phone]

## 2021-03-19 ENCOUNTER — Ambulatory Visit (HOSPITAL_COMMUNITY)
Admission: RE | Admit: 2021-03-19 | Discharge: 2021-03-19 | Disposition: A | Discharge status: Home / Self Care | Payer: Medicare Other | Source: Ambulatory Visit | Attending: Adult Health | Admitting: Adult Health

## 2021-03-19 ENCOUNTER — Other Ambulatory Visit: Payer: Self-pay

## 2021-03-19 ENCOUNTER — Encounter (HOSPITAL_COMMUNITY): Payer: Self-pay

## 2021-03-19 VITALS — BP 190/92 | HR 98 | Wt 168.0 lb

## 2021-03-19 DIAGNOSIS — Z66 Do not resuscitate: Secondary | ICD-10-CM | POA: Insufficient documentation

## 2021-03-19 DIAGNOSIS — Z794 Long term (current) use of insulin: Secondary | ICD-10-CM | POA: Insufficient documentation

## 2021-03-19 DIAGNOSIS — Z955 Presence of coronary angioplasty implant and graft: Secondary | ICD-10-CM | POA: Insufficient documentation

## 2021-03-19 DIAGNOSIS — I5032 Chronic diastolic (congestive) heart failure: Secondary | ICD-10-CM

## 2021-03-19 DIAGNOSIS — E782 Mixed hyperlipidemia: Secondary | ICD-10-CM | POA: Diagnosis not present

## 2021-03-19 DIAGNOSIS — Z888 Allergy status to other drugs, medicaments and biological substances status: Secondary | ICD-10-CM | POA: Insufficient documentation

## 2021-03-19 DIAGNOSIS — I252 Old myocardial infarction: Secondary | ICD-10-CM | POA: Insufficient documentation

## 2021-03-19 DIAGNOSIS — Z7902 Long term (current) use of antithrombotics/antiplatelets: Secondary | ICD-10-CM | POA: Insufficient documentation

## 2021-03-19 DIAGNOSIS — I5042 Chronic combined systolic (congestive) and diastolic (congestive) heart failure: Secondary | ICD-10-CM | POA: Insufficient documentation

## 2021-03-19 DIAGNOSIS — I10 Essential (primary) hypertension: Secondary | ICD-10-CM | POA: Diagnosis not present

## 2021-03-19 DIAGNOSIS — Z881 Allergy status to other antibiotic agents status: Secondary | ICD-10-CM | POA: Diagnosis not present

## 2021-03-19 DIAGNOSIS — I739 Peripheral vascular disease, unspecified: Secondary | ICD-10-CM | POA: Diagnosis not present

## 2021-03-19 DIAGNOSIS — Z880 Allergy status to penicillin: Secondary | ICD-10-CM | POA: Diagnosis not present

## 2021-03-19 DIAGNOSIS — G4733 Obstructive sleep apnea (adult) (pediatric): Secondary | ICD-10-CM | POA: Diagnosis not present

## 2021-03-19 DIAGNOSIS — I480 Paroxysmal atrial fibrillation: Secondary | ICD-10-CM | POA: Diagnosis not present

## 2021-03-19 DIAGNOSIS — Z79899 Other long term (current) drug therapy: Secondary | ICD-10-CM | POA: Insufficient documentation

## 2021-03-19 DIAGNOSIS — Z87891 Personal history of nicotine dependence: Secondary | ICD-10-CM | POA: Diagnosis not present

## 2021-03-19 DIAGNOSIS — E785 Hyperlipidemia, unspecified: Secondary | ICD-10-CM | POA: Insufficient documentation

## 2021-03-19 DIAGNOSIS — Z7901 Long term (current) use of anticoagulants: Secondary | ICD-10-CM | POA: Insufficient documentation

## 2021-03-19 DIAGNOSIS — Z8249 Family history of ischemic heart disease and other diseases of the circulatory system: Secondary | ICD-10-CM | POA: Insufficient documentation

## 2021-03-19 DIAGNOSIS — I4892 Unspecified atrial flutter: Secondary | ICD-10-CM | POA: Diagnosis not present

## 2021-03-19 DIAGNOSIS — I110 Hypertensive heart disease with heart failure: Secondary | ICD-10-CM | POA: Insufficient documentation

## 2021-03-19 DIAGNOSIS — J441 Chronic obstructive pulmonary disease with (acute) exacerbation: Secondary | ICD-10-CM | POA: Insufficient documentation

## 2021-03-19 DIAGNOSIS — I2721 Secondary pulmonary arterial hypertension: Secondary | ICD-10-CM

## 2021-03-19 DIAGNOSIS — E1151 Type 2 diabetes mellitus with diabetic peripheral angiopathy without gangrene: Secondary | ICD-10-CM | POA: Diagnosis not present

## 2021-03-19 DIAGNOSIS — Z833 Family history of diabetes mellitus: Secondary | ICD-10-CM | POA: Diagnosis not present

## 2021-03-19 DIAGNOSIS — Z885 Allergy status to narcotic agent status: Secondary | ICD-10-CM | POA: Diagnosis not present

## 2021-03-19 DIAGNOSIS — I2510 Atherosclerotic heart disease of native coronary artery without angina pectoris: Secondary | ICD-10-CM | POA: Insufficient documentation

## 2021-03-19 MED ORDER — AMLODIPINE BESYLATE 2.5 MG PO TABS: 2.5000 mg | ORAL_TABLET | Freq: Every day | ORAL | 11 refills | Status: DC

## 2021-03-19 NOTE — Patient Instructions (Signed)
No Labs done today.   STOP taking Amlodipine 10mg   START taking Amlodipine 2.5mg  (1 tablet) by mouth daily.  Samples of Eliquis were provided to you today in office.   No other medication changes were made. Please continue all current medications as prescribed.  Your physician recommends that you schedule a follow-up appointment in: 3 months with Dr. Haroldine Laws  If you have any questions or concerns before your next appointment please send [place] a message through [facility] or call our office at [phone].    TO LEAVE A MESSAGE FOR THE NURSE SELECT OPTION 2, PLEASE LEAVE A MESSAGE INCLUDING: . YOUR NAME . DATE OF BIRTH . CALL BACK NUMBER . REASON FOR CALL**this is important as we prioritize the call backs  YOU WILL RECEIVE A CALL BACK THE SAME DAY AS LONG AS YOU CALL BEFORE 4:00 PM   Do the following things EVERYDAY: 1) Weigh yourself in the morning before breakfast. Write it down and keep it in a log. 2) Take your medicines as prescribed 3) Eat low salt foods--Limit salt (sodium) to 2000 mg per day.  4) Stay as active as you can everyday 5) Limit all fluids for the day to less than 2 liters   At the [facility], you and your health needs are our priority. As part of our continuing mission to provide you with exceptional heart care, we have created designated Provider Care Teams. These Care Teams include your primary Cardiologist (physician) and Advanced Practice Providers (APPs- Physician Assistants and Nurse Practitioners) who all work together to provide you with the care you need, when you need it.   You may see any of the following providers on your designated Care Team at your next follow up: Marland Kitchen Dr Glori Bickers . Dr Loralie Champagne . Darrick Grinder, NP . Lyda Jester, PA . Audry Riles, PharmD   Please be sure to bring in all your medications bottles to every appointment.

## 2021-03-19 NOTE — Addendum Note (Signed)
Encounter addended by: Shonna Chock, CMA on: 7/79/3903 4:44 PM  Actions taken: Clinical Note Signed

## 2021-03-19 NOTE — Progress Notes (Signed)
Medication Samples have been provided to the patient.  Drug name: Eliquis       Strength: 5mg         Qty: 4  LOT: [id number]  Exp.Date: 03/2023      03/2022  Dosing instructions: take 1 tablet by mouth bid  The patient has been instructed regarding the correct time, dose, and frequency of taking this medication, including desired effects and most common side effects.   [name] [name] 8:33 PM 03/19/2021

## 2021-03-23 ENCOUNTER — Inpatient Hospital Stay: Payer: Medicare Other | Admitting: Adult Health

## 2021-03-23 DIAGNOSIS — R262 Difficulty in walking, not elsewhere classified: Secondary | ICD-10-CM | POA: Diagnosis not present

## 2021-03-23 DIAGNOSIS — J449 Chronic obstructive pulmonary disease, unspecified: Secondary | ICD-10-CM | POA: Diagnosis not present

## 2021-03-23 DIAGNOSIS — M6281 Muscle weakness (generalized): Secondary | ICD-10-CM | POA: Diagnosis not present

## 2021-03-24 ENCOUNTER — Inpatient Hospital Stay: Payer: Medicare Other | Admitting: Adult Health

## 2021-03-24 ENCOUNTER — Ambulatory Visit: Payer: Medicare Other | Admitting: Adult Health

## 2021-03-24 ENCOUNTER — Encounter: Payer: Self-pay | Admitting: Adult Health

## 2021-03-24 ENCOUNTER — Other Ambulatory Visit: Payer: Self-pay

## 2021-03-24 ENCOUNTER — Ambulatory Visit: Payer: Medicare Other | Admitting: Endocrinology

## 2021-03-24 VITALS — BP 160/60 | HR 95 | Ht 64.0 in | Wt 165.4 lb

## 2021-03-24 DIAGNOSIS — I272 Pulmonary hypertension, unspecified: Secondary | ICD-10-CM | POA: Diagnosis not present

## 2021-03-24 DIAGNOSIS — G4733 Obstructive sleep apnea (adult) (pediatric): Secondary | ICD-10-CM

## 2021-03-24 DIAGNOSIS — E1122 Type 2 diabetes mellitus with diabetic chronic kidney disease: Secondary | ICD-10-CM | POA: Diagnosis not present

## 2021-03-24 DIAGNOSIS — I5043 Acute on chronic combined systolic (congestive) and diastolic (congestive) heart failure: Secondary | ICD-10-CM

## 2021-03-24 DIAGNOSIS — Z9989 Dependence on other enabling machines and devices: Secondary | ICD-10-CM

## 2021-03-24 DIAGNOSIS — Z794 Long term (current) use of insulin: Secondary | ICD-10-CM

## 2021-03-24 DIAGNOSIS — N1831 Chronic kidney disease, stage 3a: Secondary | ICD-10-CM | POA: Diagnosis not present

## 2021-03-24 LAB — POCT GLYCOSYLATED HEMOGLOBIN (HGB A1C): Hemoglobin A1C: 8 % — AB (ref 4.0–5.6)

## 2021-03-24 MED ORDER — INSULIN LISPRO PROT & LISPRO (75-25 MIX) 100 UNIT/ML KWIKPEN: PEN_INJECTOR | SUBCUTANEOUS | 11 refills | Status: DC

## 2021-03-24 NOTE — Progress Notes (Signed)
Subjective:    Patient ID: [name], female    DOB: 03-30-1931, 87 y.o.   MRN: [id number]  HPI Pt returns for f/u of diabetes mellitus:  DM type: Insulin-requiring type 2.   Dx'ed: 0630 Complications: CAD, PN, PAD, and stage 3 CRI.  Therapy: insulin since 2007, and [facility].    GDM: never.  DKA: never. Severe hypoglycemia: never.   Pancreatitis: never.   SDOH: none. Other: she changed to qd insulin, after poor results with multiple daily injections; she was changed from lantus to QAM NPH, then to 70/30, due to am hypoglycemia and PM hyperglycemia.   Interval history: she brings a record of her cbg's which I have reviewed today.  cbg varies from 40-200.  It is in general higher as the day goes on.  since the insulin was changed to BID, she has frequent hypoglycemia in the middle of the night.  She says she is getting a good price now on 75/25, and would like to continue.   Past Medical History:  Diagnosis Date  . Anemia    iron defficiency  . Anginal pain ([place])   . Arthritis    "knees, feet, hands; joints" (05/27/2015)  . Asthma   . Atrial fibrillation or flutter    s/p RFCA 7/08;   s/p DCCV in past;   previously on amiodarone;  amio stopped due to lung toxicity  . CAD (coronary artery disease)    s/p NSTEMI tx with BMS to OM1 3/08;  cath 3/08: pOM 99% tx with PCI, pLAD 20%, ? mod stenosis at the AM  . CAP (community acquired pneumonia) 05/24/2015  . CHF (congestive heart failure) ([place])   . Chronic diastolic heart failure (HCC)    echo 11/11:  EF 55-60%, severe LVH, mod LAE, mild MR, mildly increased PASP  . COPD (chronic obstructive pulmonary disease) ([place])   . Degenerative joint disease   . Dystrophy, corneal stromal   . Heart murmur   . HLD (hyperlipidemia)   . HTN (hypertension)    essential nos  . Hypopotassemia    PMH of  . Muscle pain   . Myocardial infarction ([place]) 2008  . OSA on CPAP   . Osteoporosis   . Pneumonia 05/27/2015  . Protein calorie  malnutrition ([place])   . Rash and nonspecific skin eruption    both arms,awaiting bio   dr Delman Cheadle  . Seasonal allergies   . Shortness of breath dyspnea   . Type II diabetes mellitus ([place])    Dr Loanne Drilling    Past Surgical History:  Procedure Laterality Date  . A FLUTTER ABLATION     Dr Lovena Le  . ABDOMINAL AORTOGRAM W/LOWER EXTREMITY Bilateral 04/11/2020   Procedure: ABDOMINAL AORTOGRAM W/LOWER EXTREMITY;  Surgeon: Angelia Mould, MD;  Location: [facility];  Service: Cardiovascular;  Laterality: Bilateral;  . CATARACT EXTRACTION W/ INTRAOCULAR LENS  IMPLANT, BILATERAL Bilateral   . CHOLECYSTECTOMY    . COLONOSCOPY  6/07   2 polyps Dr. Kinnie Feil in [place]  . colonoscopy with polypectomy      X 2; [facility]  . CORNEAL TRANSPLANT Bilateral   . CORONARY ANGIOPLASTY WITH STENT PLACEMENT  02/2007   BMS w/Dr Lovena Le  . CORONARY ATHERECTOMY N/A 05/16/2020   Procedure: CORONARY ATHERECTOMY;  Surgeon: Martinique, Peter M, MD;  Location: [facility];  Service: Cardiovascular;  Laterality: N/A;  . CORONARY BALLOON ANGIOPLASTY N/A 05/16/2020   Procedure: CORONARY BALLOON ANGIOPLASTY;  Surgeon: Martinique, Peter M, MD;  Location: [facility];  Service: Cardiovascular;  Laterality: N/A;  . CORONARY STENT INTERVENTION N/A 05/16/2020   Procedure: CORONARY STENT INTERVENTION;  Surgeon: Martinique, Peter M, MD;  Location: [facility];  Service: Cardiovascular;  Laterality: N/A;  . EYE SURGERY    . gravid 2 para 2    . INTRAVASCULAR ULTRASOUND/IVUS N/A 05/16/2020   Procedure: Intravascular Ultrasound/IVUS;  Surgeon: Martinique, Peter M, MD;  Location: [facility];  Service: Cardiovascular;  Laterality: N/A;  . KNEE CARTILAGE SURGERY Right   . LEFT AND RIGHT HEART CATHETERIZATION WITH CORONARY ANGIOGRAM N/A 05/07/2014   Procedure: LEFT AND RIGHT HEART CATHETERIZATION WITH CORONARY ANGIOGRAM;  Surgeon: Peter M Martinique, MD;  Location: [facility];  Service: Cardiovascular;  Laterality: N/A;  .  PERIPHERAL VASCULAR INTERVENTION  04/11/2020   Procedure: PERIPHERAL VASCULAR INTERVENTION;  Surgeon: Angelia Mould, MD;  Location: [facility];  Service: Cardiovascular;;  . RIGHT HEART CATH N/A 02/04/2021   Procedure: RIGHT HEART CATH;  Surgeon: Jolaine Artist, MD;  Location: [facility];  Service: Cardiovascular;  Laterality: N/A;  . RIGHT/LEFT HEART CATH AND CORONARY ANGIOGRAPHY N/A 05/16/2020   Procedure: RIGHT/LEFT HEART CATH AND CORONARY ANGIOGRAPHY;  Surgeon: Jolaine Artist, MD;  Location: [facility];  Service: Cardiovascular;  Laterality: N/A;  . TOTAL KNEE ARTHROPLASTY Right 06/28/2016   Procedure: TOTAL KNEE ARTHROPLASTY;  Surgeon: Frederik Pear, MD;  Location: [facility];  Service: Orthopedics;  Laterality: Right;    Social History   Socioeconomic History  . Marital status: Widowed    Spouse name: Not on file  . Number of children: 2  . Years of education: 16  . Highest education level: Bachelor's degree (e.g., BA, AB, BS)  Occupational History  . Occupation: Recruitment consultant Retired  Tobacco Use  . Smoking status: Former Smoker    Packs/day: 0.25    Years: 18.00    Pack years: 4.50    Types: Cigarettes    Quit date: 12/27/1968    Years since quitting: 52.2  . Smokeless tobacco: Never Used  . Tobacco comment: smoked [place], up to 1 pp week  Vaping Use  . Vaping Use: Never used  Substance and Sexual Activity  . Alcohol use: Yes    Comment: occ  . Drug use: No  . Sexual activity: Never  Other Topics Concern  . Not on file  Social History Narrative   Teacher. Widowed. Rarely drinks cafeine.    Social Determinants of Health   Financial Resource Strain: Medium Risk  . Difficulty of Paying Living Expenses: Somewhat hard  Food Insecurity: No Food Insecurity  . Worried About Charity fundraiser in the Last Year: Never true  . Ran Out of Food in the Last Year: Never true  Transportation Needs: No Transportation Needs  . Lack of Transportation  (Medical): No  . Lack of Transportation (Non-Medical): No  Physical Activity: Not on file  Stress: Not on file  Social Connections: Not on file  Intimate Partner Violence: Not on file    Current Outpatient Medications on File Prior to Visit  Medication Sig Dispense Refill  . acetaminophen (TYLENOL) 325 MG tablet Take 2 tablets (650 mg total) by mouth every 6 (six) hours as needed for mild pain or headache. 20 tablet 0  . amLODipine (NORVASC) 2.5 MG tablet Take 1 tablet (2.5 mg total) by mouth daily. 30 tablet 11  . carvedilol (COREG) 6.25 MG tablet TAKE 1 TABLET BY  MOUTH  TWICE DAILY WITH MEALS 180 tablet 3  . clopidogrel (PLAVIX) 75 MG tablet TAKE 1 TABLET BY MOUTH ONCE DAILY WITH BREAKFAST 90 tablet 3  . ELIQUIS 5 MG TABS tablet TAKE 1 TABLET BY MOUTH  TWICE DAILY 60 tablet 11  . glucose blood (ONETOUCH VERIO) test strip USE TO MONITOR GLUCOSE  LEVELS TWICE DAILY 200 strip 3  . Lancets (ONETOUCH DELICA PLUS [id number]) MISC USE TO MONITOR GLUCOSE  LEVELS TWICE DAILY 200 each 3  . Multiple Vitamins-Minerals (CENTRUM SILVER ULTRA WOMENS PO) Take 1 tablet by mouth daily.    . nitroGLYCERIN (NITROSTAT) 0.4 MG SL tablet Place 1 tablet (0.4 mg total) under the tongue every 5 (five) minutes as needed for chest pain. Reported on 01/28/2016 30 tablet 3  . NOVOFINE PLUS PEN NEEDLE 32G X 4 MM MISC SMARTSIG:1 Each SUB-Q Daily    . OXYGEN Inhale 3 % into the lungs continuous.    . potassium chloride SA (KLOR-CON) 20 MEQ tablet Take 20 mEq by mouth 2 (two) times daily.    Marland Kitchen torsemide (DEMADEX) 20 MG tablet Take 2 tablets (40 mg total) by mouth 2 (two) times daily. 120 tablet 3   No current facility-administered medications on file prior to visit.    Allergies  Allergen Reactions  . Levaquin [Levofloxacin] Shortness Of Breath and Swelling    angioedema  . Lobster [Shellfish Allergy] Other (See Comments)    angioedema  . Penicillins Rash    Has patient had a PCN reaction causing immediate rash,  facial/tongue/throat swelling, SOB or lightheadedness with hypotension: Yes Has patient had a PCN reaction causing severe rash involving mucus membranes or skin necrosis: No Has patient had a PCN reaction that required hospitalization: No Has patient had a PCN reaction occurring within the last 10 years: No If all of the above answers are "NO", then may proceed with Cephalosporin use.  . Valsartan Other (See Comments)    REACTION: angioedema  . Codeine Other (See Comments)    Mental status changes  . Lipitor [Atorvastatin] Other (See Comments)    weakness  . Oxycodone Other (See Comments)    hallucinations  . Statins     Made her too weak  . Tramadol   . Amlodipine Besylate Other (See Comments)    REACTION: tingling in lips & gum edema 7/12: talked to patient, states she is tolerating well  . Crestor [Rosuvastatin Calcium] Rash  . Tramadol Hcl Nausea And Vomiting    Family History  Problem Relation Age of Onset  . Diabetes Mother   . Hypertension Mother   . Transient ischemic attack Mother   . Arthritis Mother   . Heart attack Father 75  . Arthritis Father   . Hypertension Father   . Breast cancer Maternal Aunt   . Arthritis Maternal Aunt     BP (!) 160/60 (BP Location: Right Arm, Patient Position: Sitting, Cuff Size: Normal)   Pulse 95   Ht 5\' 4"  (1.626 m)   Wt 165 lb 6.4 oz (75 kg)   SpO2 98%   BMI 28.39 kg/m    Review of Systems Denies LOC    Objective:   Physical Exam VITAL SIGNS:  See vs page.   GENERAL: no distress.   Pulses: dorsalis pedis absent bilat.   MSK: no deformity of the feet.   CV: 1+ bilat leg edema, and bilat vv's.   Skin:  no ulcer on the feet.  normal color and temp on the feet.  Neuro: sensation is intact to touch on the feet, but decreased from normal.    Lab Results  Component Value Date   CREATININE 0.84 02/05/2021   BUN 16 02/05/2021   NA 139 02/05/2021   K 3.7 02/05/2021   CL 92 (L) 02/05/2021   CO2 37 (H) 02/05/2021   Lab  Results  Component Value Date   HGBA1C 8.0 (A) 03/24/2021       Assessment & Plan:  HTN: is noted today.  Insulin-requiring type 2 DM. Hypoglycemia, due to insulin: The pattern of his cbg's indicates she needs some adjustment in his therapy.   Patient Instructions  Your blood pressure is high today.  Please see your primary care provider soon, to have it rechecked Please change the insulin to 28 units with breakfast, and 8 with supper On this type of insulin schedule, you should eat meals on a regular schedule.  If a meal is missed or significantly delayed, your blood sugar could go low.  check your blood sugar twice a day.  vary the time of day when you check, between before the 3 meals, and at bedtime.  also check if you have symptoms of your blood sugar being too high or too low.  please keep a record of the readings and bring it to your next appointment here (or you can bring the meter itself).  You can write it on any piece of paper.  please call us sooner if your blood sugar goes below 70, or if you have a lot of readings over 200. Please come back for a follow-up appointment in 2 months.

## 2021-03-24 NOTE — Progress Notes (Signed)
@Patient  ID: [name], female    DOB: September 17, 1931, 87 y.o.   MRN: [id number]    Referring provider: [name], MD  HPI: 87 year old female former smoker followed for COPD with emphysema and obstructive sleep apnea  TEST/EVENTS :  Pulmonary tests:   PFT 05/21/14 >> FEV1 1.37 (94%), FEV1% 66, TLC 4.69 (95%), DLCO 39%  Chest imaging:   CT chest 05/24/15 >> mild centrilobular emphysema  CT chest 05/25/17 >> moderate centrilobular emphysema, atherosclerosis  Sleep tests:   PSG 7/14//15 >> AHI 37  HST 02/21/17 >> AHI 30.5, SaO2 low 78%  Auto CPAP 10/23/17 to 11/21/17 >>used on 30 of 30 nights with average 6 hrs 34 min. Average AHI 1 with median CPAP 8 and 95 th percentile CPAP 11 cm H2O  HST 03/14/20 >> AHI 15.3, SpO low 79%  Auto CPAP 06/15/20 to 07/14/20 >> used on 30 of 30 nights with average 7 hrs 20 min.  Average AHI 7 with median CPAP 7 and 95 th percentile CPAP 14 cm H2O.  Cardiac tests:   Echo 05/16/20 >> EF 50 to 55%, mod LVH, grade 3 DD, mild MR    03/24/2021 Follow up : COPD, post hospital follow-up Patient has underlying COPD.  Was recently admitted to the hospital. Patient was hospitalized last month for acute hypercarbic and hypoxic respiratory failure requiring BiPAP support.  Felt secondary to decompensated systolic and diastolic heart failure.  Patient also has underlying pulmonary hypertension.  2D echo showed EF at 40 to 45%, LV had global hypokinesis.  There was LVH and mildly reduced RV function.  Patient required aggressive IV diuresis.  Right heart cath was done and showed mixed pulmonary hypertension with cor pulmonale.  She was started on sildenafil 20 mg 3 times daily but could not tolerate.  She is on oxygen 2 L with activity and at bedtime with her CPAP.  Says that she is feeling better.  She has decreased shortness of breath.  She says at rest her oxygen levels have been greater than 90% without her oxygen on. She denies any increased leg  swelling, cough, hemoptysis, chest pain.  She is receiving physical therapy and Occupational Therapy. Patient does have obstructive sleep apnea is on nocturnal CPAP.  She says she wears her CPAP every night.  CPAP download shows excellent compliance with 100% usage.  Daily average usage is 6 hours.  AHI is 2.7.  Patient is on auto CPAP 5 to 20 cm H2O.   Allergies  Allergen Reactions  . Levaquin [Levofloxacin] Shortness Of Breath and Swelling    angioedema  . Lobster [Shellfish Allergy] Other (See Comments)    angioedema  . Penicillins Rash    Has patient had a PCN reaction causing immediate rash, facial/tongue/throat swelling, SOB or lightheadedness with hypotension: Yes Has patient had a PCN reaction causing severe rash involving mucus membranes or skin necrosis: No Has patient had a PCN reaction that required hospitalization: No Has patient had a PCN reaction occurring within the last 10 years: No If all of the above answers are "NO", then may proceed with Cephalosporin use.  . Valsartan Other (See Comments)    REACTION: angioedema  . Codeine Other (See Comments)    Mental status changes  . Lipitor [Atorvastatin] Other (See Comments)    weakness  . Oxycodone Other (See Comments)    hallucinations  . Statins     Made her too weak  . Tramadol   . Amlodipine Besylate Other (See Comments)  REACTION: tingling in lips & gum edema 7/12: talked to patient, states she is tolerating well  . Crestor [Rosuvastatin Calcium] Rash  . Tramadol Hcl Nausea And Vomiting    Immunization History  Administered Date(s) Administered  . Fluad Quad(high Dose 65+) 08/22/2019  . Influenza Split 11/16/2011, 09/26/2012  . Influenza Whole 12/27/2004, 10/21/2008, 10/20/2009, 11/11/2010  . Influenza, High Dose Seasonal PF 10/14/2016, 10/10/2017, 09/28/2018  . Influenza,inj,Quad PF,6+ Mos 09/24/2013, 12/16/2014, 09/19/2015  . Moderna Sars-Covid-2 Vaccination 02/08/2020, 03/10/2020  . PPD Test  05/31/2015, 07/07/2016  . Pneumococcal Conjugate-13 07/31/2018  . Pneumococcal Polysaccharide-23 12/16/2014  . Pneumococcal-Unspecified 05/31/2015  . Td 06/15/2010    Past Medical History:  Diagnosis Date  . Anemia    iron defficiency  . Anginal pain ([place])   . Arthritis    "knees, feet, hands; joints" (05/27/2015)  . Asthma   . Atrial fibrillation or flutter    s/p RFCA 7/08;   s/p DCCV in past;   previously on amiodarone;  amio stopped due to lung toxicity  . CAD (coronary artery disease)    s/p NSTEMI tx with BMS to OM1 3/08;  cath 3/08: pOM 99% tx with PCI, pLAD 20%, ? mod stenosis at the AM  . CAP (community acquired pneumonia) 05/24/2015  . CHF (congestive heart failure) ([place])   . Chronic diastolic heart failure (HCC)    echo 11/11:  EF 55-60%, severe LVH, mod LAE, mild MR, mildly increased PASP  . COPD (chronic obstructive pulmonary disease) ([place])   . Degenerative joint disease   . Dystrophy, corneal stromal   . Heart murmur   . HLD (hyperlipidemia)   . HTN (hypertension)    essential nos  . Hypopotassemia    PMH of  . Muscle pain   . Myocardial infarction ([place]) 2008  . OSA on CPAP   . Osteoporosis   . Pneumonia 05/27/2015  . Protein calorie malnutrition ([place])   . Rash and nonspecific skin eruption    both arms,awaiting bio   dr Delman Cheadle  . Seasonal allergies   . Shortness of breath dyspnea   . Type II diabetes mellitus ([place])    Dr Loanne Drilling    Tobacco History: Social History   Tobacco Use  Smoking Status Former Smoker  . Packs/day: 0.25  . Years: 18.00  . Pack years: 4.50  . Types: Cigarettes  . Quit date: 12/27/1968  . Years since quitting: 52.2  Smokeless Tobacco Never Used  Tobacco Comment   smoked [place], up to 1 pp week   Counseling given: Not Answered Comment: smoked [place], up to 1 pp week   Outpatient Medications Prior to Visit  Medication Sig Dispense Refill  . acetaminophen (TYLENOL) 325 MG tablet Take 2 tablets (650 mg total) by mouth  every 6 (six) hours as needed for mild pain or headache. 20 tablet 0  . amLODipine (NORVASC) 2.5 MG tablet Take 1 tablet (2.5 mg total) by mouth daily. 30 tablet 11  . carvedilol (COREG) 6.25 MG tablet TAKE 1 TABLET BY MOUTH  TWICE DAILY WITH MEALS 180 tablet 3  . clopidogrel (PLAVIX) 75 MG tablet TAKE 1 TABLET BY MOUTH ONCE DAILY WITH BREAKFAST 90 tablet 3  . ELIQUIS 5 MG TABS tablet TAKE 1 TABLET BY MOUTH  TWICE DAILY 60 tablet 11  . glucose blood (ONETOUCH VERIO) test strip USE TO MONITOR GLUCOSE  LEVELS TWICE DAILY 200 strip 3  . Insulin Lispro Prot & Lispro (HUMALOG MIX 75/25 KWIKPEN) (75-25) 100 UNIT/ML Kwikpen 28  units with breakfast and 8 units with dinner. 15 mL 11  . Lancets (ONETOUCH DELICA PLUS [id number]) MISC USE TO MONITOR GLUCOSE  LEVELS TWICE DAILY 200 each 3  . Multiple Vitamins-Minerals (CENTRUM SILVER ULTRA WOMENS PO) Take 1 tablet by mouth daily.    . nitroGLYCERIN (NITROSTAT) 0.4 MG SL tablet Place 1 tablet (0.4 mg total) under the tongue every 5 (five) minutes as needed for chest pain. Reported on 01/28/2016 30 tablet 3  . NOVOFINE PLUS PEN NEEDLE 32G X 4 MM MISC SMARTSIG:1 Each SUB-Q Daily    . OXYGEN Inhale 3 % into the lungs continuous.    . potassium chloride SA (KLOR-CON) 20 MEQ tablet Take 20 mEq by mouth 2 (two) times daily.    Marland Kitchen torsemide (DEMADEX) 20 MG tablet Take 2 tablets (40 mg total) by mouth 2 (two) times daily. 120 tablet 3   No facility-administered medications prior to visit.     Review of Systems:   Constitutional:   No  weight loss, night sweats,  Fevers, chills, fatigue, or  lassitude.  HEENT:   No headaches,  Difficulty swallowing,  Tooth/dental problems, or  Sore throat,                No sneezing, itching, ear ache, nasal congestion, post nasal drip,   CV:  No chest pain,  Orthopnea, PND, swelling in lower extremities, anasarca, dizziness, palpitations, syncope.   GI  No heartburn, indigestion, abdominal pain, nausea, vomiting, diarrhea, change  in bowel habits, loss of appetite, bloody stools.   Resp: No shortness of breath with exertion or at rest.  No excess mucus, no productive cough,  No non-productive cough,  No coughing up of blood.  No change in color of mucus.  No wheezing.  No chest wall deformity  Skin: no rash or lesions.  GU: no dysuria, change in color of urine, no urgency or frequency.  No flank pain, no hematuria   MS:  No joint pain or swelling.  No decreased range of motion.  No back pain.    Physical Exam  There were no vitals taken for this visit.  GEN: A/Ox3; pleasant , NAD, well nourished    HEENT:  [place]/AT,  EACs-clear, TMs-wnl, NOSE-clear, THROAT-clear, no lesions, no postnasal drip or exudate noted.   NECK:  Supple w/ fair ROM; no JVD; normal carotid impulses w/o bruits; no thyromegaly or nodules palpated; no lymphadenopathy.    RESP  Clear  P & A; w/o, wheezes/ rales/ or rhonchi. no accessory muscle use, no dullness to percussion  CARD:  RRR, no m/r/g, no peripheral edema, pulses intact, no cyanosis or clubbing.  GI:   Soft & nt; nml bowel sounds; no organomegaly or masses detected.   Musco: Warm bil, no deformities or joint swelling noted.   Neuro: alert, no focal deficits noted.    Skin: Warm, no lesions or rashes    Lab Results:  CBC    Component Value Date/Time   WBC 5.1 02/05/2021 0215   RBC 4.86 02/05/2021 0215   HGB 14.7 02/05/2021 0215   HCT 46.5 (H) 02/05/2021 0215   PLT 156 02/05/2021 0215   MCV 95.7 02/05/2021 0215   MCH 30.2 02/05/2021 0215   MCHC 31.6 02/05/2021 0215   RDW 13.6 02/05/2021 0215   LYMPHSABS 1.0 02/05/2021 0215   MONOABS 0.9 02/05/2021 0215   EOSABS 0.2 02/05/2021 0215   BASOSABS 0.0 02/05/2021 0215    BMET    Component Value Date/Time  NA 139 02/05/2021 0215   NA 138 07/02/2016 0000   NA 138 07/02/2016 0000   K 3.7 02/05/2021 0215   CL 92 (L) 02/05/2021 0215   CO2 37 (H) 02/05/2021 0215   GLUCOSE 183 (H) 02/05/2021 0215   BUN 16 02/05/2021  0215   BUN 25 (A) 07/02/2016 0000   BUN 25 (A) 07/02/2016 0000   CREATININE 0.84 02/05/2021 0215   CALCIUM 9.7 02/05/2021 0215   GFRNONAA >60 02/05/2021 0215   GFRAA 46 (L) 06/09/2020 1133    BNP    Component Value Date/Time   BNP 171.8 (H) 01/30/2021 1037    ProBNP    Component Value Date/Time   PROBNP 453.0 (H) 01/30/2018 1236    Imaging: No results found.    PFT Results Latest Ref Rng & Units 05/21/2014  FVC-Pre L 2.10  FVC-Predicted Pre % 110  FVC-Post L 2.09  FVC-Predicted Post % 110  Pre FEV1/FVC % % 67  Post FEV1/FCV % % 66  FEV1-Pre L 1.40  FEV1-Predicted Pre % 95  FEV1-Post L 1.37  DLCO uncorrected ml/min/mmHg 8.97  DLCO UNC% % 39  DLCO corrected ml/min/mmHg 8.97  DLCO COR %Predicted % 39  DLVA Predicted % 54  TLC L 4.69  TLC % Predicted % 95  RV % Predicted % 117    No results found for: NITRICOXIDE      Assessment & Plan:   No problem-specific Assessment & Plan notes found for this encounter.     Rexene Edison, NP 03/24/2021

## 2021-03-24 NOTE — Patient Instructions (Addendum)
Your blood pressure is high today.  Please see your primary care provider soon, to have it rechecked Please change the insulin to 28 units with breakfast, and 8 with supper On this type of insulin schedule, you should eat meals on a regular schedule.  If a meal is missed or significantly delayed, your blood sugar could go low.  check your blood sugar twice a day.  vary the time of day when you check, between before the 3 meals, and at bedtime.  also check if you have symptoms of your blood sugar being too high or too low.  please keep a record of the readings and bring it to your next appointment here (or you can bring the meter itself).  You can write it on any piece of paper.  please call us sooner if your blood sugar goes below 70, or if you have a lot of readings over 200. Please come back for a follow-up appointment in 2 months.

## 2021-03-24 NOTE — Patient Instructions (Signed)
Continue on Oxygen 2l/m with activity and At bedtime  With CPAP .  Can be off oxygen at rest .  Goal is to keep sats >88-90%.  Continue on CPAP At bedtime  With oxygen .  Keep up good work .  Low salt diet .  Continue follow up with Cardiology as planned  Activity as tolerated.  Follow up with Dr. Halford Chessman or Jerard Bays NP in 6-8 weeks and As needed

## 2021-03-25 DIAGNOSIS — J449 Chronic obstructive pulmonary disease, unspecified: Secondary | ICD-10-CM | POA: Diagnosis not present

## 2021-03-25 DIAGNOSIS — M6281 Muscle weakness (generalized): Secondary | ICD-10-CM | POA: Diagnosis not present

## 2021-03-25 DIAGNOSIS — J9601 Acute respiratory failure with hypoxia: Secondary | ICD-10-CM | POA: Diagnosis not present

## 2021-03-25 DIAGNOSIS — R262 Difficulty in walking, not elsewhere classified: Secondary | ICD-10-CM | POA: Diagnosis not present

## 2021-03-26 ENCOUNTER — Other Ambulatory Visit: Payer: Self-pay

## 2021-03-26 ENCOUNTER — Other Ambulatory Visit: Payer: Medicare Other | Admitting: Nurse Practitioner

## 2021-03-26 DIAGNOSIS — J9611 Chronic respiratory failure with hypoxia: Secondary | ICD-10-CM | POA: Diagnosis not present

## 2021-03-26 DIAGNOSIS — J9601 Acute respiratory failure with hypoxia: Secondary | ICD-10-CM | POA: Diagnosis not present

## 2021-03-26 DIAGNOSIS — M6281 Muscle weakness (generalized): Secondary | ICD-10-CM | POA: Diagnosis not present

## 2021-03-26 DIAGNOSIS — Z515 Encounter for palliative care: Secondary | ICD-10-CM

## 2021-03-26 NOTE — Progress Notes (Signed)
[employer] Palliative Care Consult Note Telephone: [phone]  Fax: [phone]  PATIENT NAME: [name] [address] [place] [address] [phone] (home)  DOB: 05/25/1931 MRN: [id number]  PRIMARY CARE PROVIDER:    [name], MD,  [place] [address] [phone]  REFERRING PROVIDER:   [name], MD [address] [place],  [place] [address] [phone]  RESPONSIBLE PARTY:   Extended Emergency Contact Information Primary Emergency Contact: Therrell,[name] Address: [address]          [place], [place] [address] [place] Phone: [phone] Mobile Phone: [phone] Relation: Son Secondary Emergency Contact: Myrla Halsted Address: [place], [place] [address] [name] Phone: [phone] Mobile Phone: [phone] Relation: Daughter  I met face to face with patient in independent living facility of [place].    ASSESSMENT AND RECOMMENDATIONS:   Advance Care Planning: Today's visit consisted of building trust and discussions on Palliative care medicine as a specialized medical care for people living with serious illness, aimed at facilitating improved quality of life through symptoms relief, assisting with advance care planning and establishing goals of care. Patient expressed appreciation for education provided on Palliative care and how it differs from Hospice service. Goal of care: Patient's goal of care is comfort while preserving function. Patient endorsed having led a good life and ready to die when it is her time saying she does not want to be a burden on her family. Directives: Patient report her code status to be DNR, she reiterated desire to not be resusucited in the event of cardiac or respiratory arrest. The need to complete a MOST form was discussed, patient expressed interest in  completing a MOST form today. Discussed and reviewed sections of the form in detail, opportunity for questions given, all questions answered. Form completed and signed with patient, signed form given to patient to keep. Details of MOST include; comfort measures, determine use or limitation of antibiotics, IV fluid for a defined trial period, no feeding tube.  Symptom Management:  Chronic respiratory failure with hypoxia: Stable on current regimen. On continuos oxygen supplementation at 2L with goal to maintian saturation >88-90%.Usea CPAP at night. Heart failure: Was last hospitalized from 01/30/2021 to 02/05/2021 for HF exacerbation. Patient is followed by Dr. Haroldine Laws. She is currently receiving PT/OT post hospitalization. Denied increased SOB, denied orthopnea, or acute cough. Continue current plan of care, follow up with advance heart failure as scheduled. Diabetes: A1c 8 unit on 03/24/2021. Followed by Endocrinology, last visit was yesterday where her insulin dose was adjusted. Currently on Humalog 28 units in morning and 8 units in evening. She denied polyuria, denied polydipsia, denied polyphagia. Denied hypoglycemia. Wound/Bruise: related to injury to medial right lower leg 2 weeks ago from oxygen tank falling on patient's leg. Patient report color of bruise lightning up, report pain is managed with PRN Tylenol. Patient to monitor and report worsening pain or condition to her PCP. Patient voiced no other concern at visit.  Questions and concerns were addressed. Patient encouraged to call with questions and/or concerns. My business card was provided.  Follow up Palliative Care Visit: Palliative care will continue to follow for complex decision making and symptom management. Return in about 4 weeks or prn.  Family /Caregiver/Community Supports: Patient is a widow, she has 3 children, they very involved in her care.  Cognitive / Functional decline: Patient awake, alert and coherent, makes her own  decisions and manages her medications. She is independent with her ADLs. Walks with a Rolator walker. No report of falls.  I spent 55 minutes providing this consultation, time includes time spent with patient, chart review, and documentation. More than 50% of the time in this consultation was spent counseling and coordinating communication.   CHIEF COMPLAINT: Initial palliative care visit  History obtained from review of [facility] EMR and  discussion with patient. Records reviewed and summarized bellow.  HISTORY OF PRESENT ILLNESS:  [name] is a 87 y.o. year old female with multiple medical problems including CAD s/p 1v stent, HTN, Afib (on Eliquis), pulmonary hypertension on chronic oxygen use, CHF (EF 50-55%), type 2 diabetes, CKD 3, PAD, osteoarthritis, OSA on CPAP. Palliative Care was asked to follow this patient by consultation request of Dr. Haroldine Laws to help address advance care planning and goals of care.   CODE STATUS: DNR  PPS: 70% weak  HOSPICE ELIGIBILITY/DIAGNOSIS: TBD  ROS/staff/patient Constitutional: denies fever, denies chills, denies fatigue EYES: denies acute vision changes ENMT: denies dysphagia Cardiovascular: denies chest pain, denies palpitation Pulmonary: denies acute cough, denies increased SOB Abdomen: endorses good appetite, denies constipation, denies incontinence of bowel GU: denies dysuria, endorses incontinence of urine MSK:  endorses ROM limitations, no falls reported, Skin: denies rashes or wounds Neurological: endorses weakness, denies insomnia Psych: Endorses positive mood Heme/lymph/immuno: endorsed bruise on right lower leg, abnormal bleeding   Physical Exam: Vital signs: BP 148/80, P 80, RR 18, 96% on 2L oxygen Current and past weights: stable at 165lbs, 64f4", BMI 28.3kg/m2 General: well appearing, cooperative, sitting in chair in NAD EYES: anicteric sclera, lids intact, no discharge  ENMT: intact hearing, oral mucous  membranes moist CV:  +1 Bilateral pedal edema Pulmonary: no increased work of breathing, no cough, no audible wheezes, room air Abdomen: no ascites GU: deferred MSK: moves all extremities, ambulatory Skin: warm and dry, no rashes, bruised area noted to medial lower leg proximal to right ankle  Neuro: Generalized weakness, A and O x 4 Psych: non-anxious affect today Hem/lymph/immuno: no widespread bruising   PAST MEDICAL HISTORY:  Past Medical History:  Diagnosis Date  . Anemia    iron defficiency  . Anginal pain ([facility]   . Arthritis    "knees, feet, hands; joints" (05/27/2015)  . Asthma   . Atrial fibrillation or flutter    s/p RFCA 7/08;   s/p DCCV in past;   previously on amiodarone;  amio stopped due to lung toxicity  . CAD (coronary artery disease)    s/p NSTEMI tx with BMS to OM1 3/08;  cath 3/08: pOM 99% tx with PCI, pLAD 20%, ? mod stenosis at the AM  . CAP (community acquired pneumonia) 05/24/2015  . CHF (congestive heart failure) ([facility]   . Chronic diastolic heart failure (HCC)    echo 11/11:  EF 55-60%, severe LVH, mod LAE, mild MR, mildly increased PASP  . COPD (chronic obstructive pulmonary disease) ([facility]   . Degenerative joint disease   . Dystrophy, corneal stromal   . Heart murmur   . HLD (hyperlipidemia)   . HTN (hypertension)    essential nos  . Hypopotassemia    PMH of  . Muscle pain   . Myocardial infarction ([facility] 2008  . OSA on CPAP   . Osteoporosis   . Pneumonia 05/27/2015  . Protein calorie malnutrition ([facility]   . Rash and nonspecific  skin eruption    both arms,awaiting bio   dr Delman Cheadle  . Seasonal allergies   . Shortness of breath dyspnea   . Type II diabetes mellitus ([place])    Dr Loanne Drilling    SOCIAL HX:  Social History   Tobacco Use  . Smoking status: Former Smoker    Packs/day: 0.25    Years: 18.00    Pack years: 4.50    Types: Cigarettes    Quit date: 12/27/1968    Years since quitting: 52.2  . Smokeless tobacco: Never Used  . Tobacco comment:  smoked [place], up to 1 pp week  Substance Use Topics  . Alcohol use: Yes    Comment: occ   FAMILY HX:  Family History  Problem Relation Age of Onset  . Diabetes Mother   . Hypertension Mother   . Transient ischemic attack Mother   . Arthritis Mother   . Heart attack Father 65  . Arthritis Father   . Hypertension Father   . Breast cancer Maternal Aunt   . Arthritis Maternal Aunt     ALLERGIES:  Allergies  Allergen Reactions  . Levaquin [Levofloxacin] Shortness Of Breath and Swelling    angioedema  . Lobster [Shellfish Allergy] Other (See Comments)    angioedema  . Penicillins Rash    Has patient had a PCN reaction causing immediate rash, facial/tongue/throat swelling, SOB or lightheadedness with hypotension: Yes Has patient had a PCN reaction causing severe rash involving mucus membranes or skin necrosis: No Has patient had a PCN reaction that required hospitalization: No Has patient had a PCN reaction occurring within the last 10 years: No If all of the above answers are "NO", then may proceed with Cephalosporin use.  . Valsartan Other (See Comments)    REACTION: angioedema  . Codeine Other (See Comments)    Mental status changes  . Lipitor [Atorvastatin] Other (See Comments)    weakness  . Oxycodone Other (See Comments)    hallucinations  . Statins     Made her too weak  . Tramadol   . Amlodipine Besylate Other (See Comments)    REACTION: tingling in lips & gum edema 7/12: talked to patient, states she is tolerating well  . Crestor [Rosuvastatin Calcium] Rash  . Tramadol Hcl Nausea And Vomiting     PERTINENT MEDICATIONS:  Outpatient Encounter Medications as of 03/26/2021  Medication Sig  . acetaminophen (TYLENOL) 325 MG tablet Take 2 tablets (650 mg total) by mouth every 6 (six) hours as needed for mild pain or headache.  Marland Kitchen amLODipine (NORVASC) 2.5 MG tablet Take 1 tablet (2.5 mg total) by mouth daily.  . carvedilol (COREG) 6.25 MG tablet TAKE 1 TABLET BY  MOUTH  TWICE DAILY WITH MEALS  . clopidogrel (PLAVIX) 75 MG tablet TAKE 1 TABLET BY MOUTH ONCE DAILY WITH BREAKFAST  . ELIQUIS 5 MG TABS tablet TAKE 1 TABLET BY MOUTH  TWICE DAILY  . glucose blood (ONETOUCH VERIO) test strip USE TO MONITOR GLUCOSE  LEVELS TWICE DAILY  . Insulin Lispro Prot & Lispro (HUMALOG MIX 75/25 KWIKPEN) (75-25) 100 UNIT/ML Kwikpen 28 units with breakfast and 8 units with dinner.  . Lancets (ONETOUCH DELICA PLUS [id number]) MISC USE TO MONITOR GLUCOSE  LEVELS TWICE DAILY  . Multiple Vitamins-Minerals (CENTRUM SILVER ULTRA WOMENS PO) Take 1 tablet by mouth daily.  . nitroGLYCERIN (NITROSTAT) 0.4 MG SL tablet Place 1 tablet (0.4 mg total) under the tongue every 5 (five) minutes as needed for chest pain. Reported on  01/28/2016  . NOVOFINE PLUS PEN NEEDLE 32G X 4 MM MISC SMARTSIG:1 Each SUB-Q Daily  . OXYGEN Inhale 3 % into the lungs continuous.  . potassium chloride SA (KLOR-CON) 20 MEQ tablet Take 20 mEq by mouth 2 (two) times daily.  Marland Kitchen torsemide (DEMADEX) 20 MG tablet Take 2 tablets (40 mg total) by mouth 2 (two) times daily.   No facility-administered encounter medications on file as of 03/26/2021.    Thank you for the opportunity to participate in the care of Ms. [name]. The palliative care team will continue to follow. Please call our office at [phone] if we can be of additional assistance.  Jari Favre, DNP, AGPCNP-BC

## 2021-03-27 NOTE — Assessment & Plan Note (Signed)
Continue on oxygen, nocturnal CPAP. Follow-up with cardiology as planned

## 2021-03-27 NOTE — Assessment & Plan Note (Signed)
Excellent control compliance on nocturnal CPAP no changes

## 2021-03-27 NOTE — Assessment & Plan Note (Signed)
Recent hospitalization for decompensated congestive heart failure.  Patient appears to be clinically improving. Continue on current regimen.  And follow-up with cardiology as planned

## 2021-03-28 ENCOUNTER — Encounter (HOSPITAL_COMMUNITY): Payer: Self-pay

## 2021-03-30 ENCOUNTER — Other Ambulatory Visit (HOSPITAL_COMMUNITY): Payer: Self-pay | Admitting: *Deleted

## 2021-03-30 DIAGNOSIS — M6281 Muscle weakness (generalized): Secondary | ICD-10-CM | POA: Diagnosis not present

## 2021-03-30 DIAGNOSIS — J9601 Acute respiratory failure with hypoxia: Secondary | ICD-10-CM | POA: Diagnosis not present

## 2021-03-30 MED ORDER — AMLODIPINE BESYLATE 2.5 MG PO TABS: 2.5000 mg | ORAL_TABLET | Freq: Every day | ORAL | 3 refills | Status: DC

## 2021-03-31 DIAGNOSIS — M6281 Muscle weakness (generalized): Secondary | ICD-10-CM | POA: Diagnosis not present

## 2021-03-31 DIAGNOSIS — R262 Difficulty in walking, not elsewhere classified: Secondary | ICD-10-CM | POA: Diagnosis not present

## 2021-03-31 DIAGNOSIS — J9601 Acute respiratory failure with hypoxia: Secondary | ICD-10-CM | POA: Diagnosis not present

## 2021-03-31 DIAGNOSIS — J449 Chronic obstructive pulmonary disease, unspecified: Secondary | ICD-10-CM | POA: Diagnosis not present

## 2021-04-01 ENCOUNTER — Other Ambulatory Visit (HOSPITAL_COMMUNITY): Payer: Self-pay | Admitting: Internal Medicine

## 2021-04-01 DIAGNOSIS — R262 Difficulty in walking, not elsewhere classified: Secondary | ICD-10-CM | POA: Diagnosis not present

## 2021-04-01 DIAGNOSIS — M6281 Muscle weakness (generalized): Secondary | ICD-10-CM | POA: Diagnosis not present

## 2021-04-01 DIAGNOSIS — J449 Chronic obstructive pulmonary disease, unspecified: Secondary | ICD-10-CM | POA: Diagnosis not present

## 2021-04-02 DIAGNOSIS — M6281 Muscle weakness (generalized): Secondary | ICD-10-CM | POA: Diagnosis not present

## 2021-04-02 DIAGNOSIS — J9601 Acute respiratory failure with hypoxia: Secondary | ICD-10-CM | POA: Diagnosis not present

## 2021-04-02 DIAGNOSIS — J449 Chronic obstructive pulmonary disease, unspecified: Secondary | ICD-10-CM | POA: Diagnosis not present

## 2021-04-02 DIAGNOSIS — R262 Difficulty in walking, not elsewhere classified: Secondary | ICD-10-CM | POA: Diagnosis not present

## 2021-04-05 DIAGNOSIS — J9602 Acute respiratory failure with hypercapnia: Secondary | ICD-10-CM | POA: Diagnosis not present

## 2021-04-05 DIAGNOSIS — I5033 Acute on chronic diastolic (congestive) heart failure: Secondary | ICD-10-CM | POA: Diagnosis not present

## 2021-04-05 DIAGNOSIS — G4733 Obstructive sleep apnea (adult) (pediatric): Secondary | ICD-10-CM | POA: Diagnosis not present

## 2021-04-05 DIAGNOSIS — J9601 Acute respiratory failure with hypoxia: Secondary | ICD-10-CM | POA: Diagnosis not present

## 2021-04-06 DIAGNOSIS — I5033 Acute on chronic diastolic (congestive) heart failure: Secondary | ICD-10-CM | POA: Diagnosis not present

## 2021-04-06 DIAGNOSIS — J9601 Acute respiratory failure with hypoxia: Secondary | ICD-10-CM | POA: Diagnosis not present

## 2021-04-06 DIAGNOSIS — G4733 Obstructive sleep apnea (adult) (pediatric): Secondary | ICD-10-CM | POA: Diagnosis not present

## 2021-04-06 DIAGNOSIS — J9602 Acute respiratory failure with hypercapnia: Secondary | ICD-10-CM | POA: Diagnosis not present

## 2021-04-07 DIAGNOSIS — J9601 Acute respiratory failure with hypoxia: Secondary | ICD-10-CM | POA: Diagnosis not present

## 2021-04-07 DIAGNOSIS — M6281 Muscle weakness (generalized): Secondary | ICD-10-CM | POA: Diagnosis not present

## 2021-04-08 DIAGNOSIS — J9601 Acute respiratory failure with hypoxia: Secondary | ICD-10-CM | POA: Diagnosis not present

## 2021-04-08 DIAGNOSIS — M6281 Muscle weakness (generalized): Secondary | ICD-10-CM | POA: Diagnosis not present

## 2021-04-09 DIAGNOSIS — R262 Difficulty in walking, not elsewhere classified: Secondary | ICD-10-CM | POA: Diagnosis not present

## 2021-04-09 DIAGNOSIS — J449 Chronic obstructive pulmonary disease, unspecified: Secondary | ICD-10-CM | POA: Diagnosis not present

## 2021-04-09 DIAGNOSIS — M6281 Muscle weakness (generalized): Secondary | ICD-10-CM | POA: Diagnosis not present

## 2021-04-10 ENCOUNTER — Telehealth: Payer: Self-pay

## 2021-04-10 DIAGNOSIS — J9601 Acute respiratory failure with hypoxia: Secondary | ICD-10-CM | POA: Diagnosis not present

## 2021-04-10 DIAGNOSIS — M6281 Muscle weakness (generalized): Secondary | ICD-10-CM | POA: Diagnosis not present

## 2021-04-10 NOTE — Progress Notes (Signed)
    Chronic Care Management Pharmacy Assistant   Name: [name]  MRN: [id number] DOB: 26-Oct-1931  Reason for Encounter: Medication Review   Medications: Outpatient Encounter Medications as of 04/10/2021  Medication Sig Note  . acetaminophen (TYLENOL) 325 MG tablet Take 2 tablets (650 mg total) by mouth every 6 (six) hours as needed for mild pain or headache.   Marland Kitchen amLODipine (NORVASC) 2.5 MG tablet Take 1 tablet (2.5 mg total) by mouth daily.   . carvedilol (COREG) 6.25 MG tablet TAKE 1 TABLET BY MOUTH  TWICE DAILY WITH MEALS   . clopidogrel (PLAVIX) 75 MG tablet TAKE 1 TABLET BY MOUTH ONCE DAILY WITH BREAKFAST   . ELIQUIS 5 MG TABS tablet TAKE 1 TABLET BY MOUTH  TWICE DAILY   . glucose blood (ONETOUCH VERIO) test strip USE TO MONITOR GLUCOSE  LEVELS TWICE DAILY   . Insulin Lispro Prot & Lispro (HUMALOG MIX 75/25 KWIKPEN) (75-25) 100 UNIT/ML Kwikpen 28 units with breakfast and 8 units with dinner.   . Lancets (ONETOUCH DELICA PLUS [id number]) MISC USE TO MONITOR GLUCOSE  LEVELS TWICE DAILY   . Multiple Vitamins-Minerals (CENTRUM SILVER ULTRA WOMENS PO) Take 1 tablet by mouth daily.   . nitroGLYCERIN (NITROSTAT) 0.4 MG SL tablet Place 1 tablet (0.4 mg total) under the tongue every 5 (five) minutes as needed for chest pain. Reported on 01/28/2016 03/10/2021: 03/10/21: Reports has not needed recently  . NOVOFINE PLUS PEN NEEDLE 32G X 4 MM MISC SMARTSIG:1 Each SUB-Q Daily   . OXYGEN Inhale 3 % into the lungs continuous.   . potassium chloride SA (KLOR-CON) 20 MEQ tablet TAKE 2 TABLETS BY MOUTH  DAILY   . torsemide (DEMADEX) 20 MG tablet Take 2 tablets (40 mg total) by mouth 2 (two) times daily.    No facility-administered encounter medications on file as of 04/10/2021.   Reviewed chart for medication changes ahead of medication coordination call.  No OVs, Consults, or hospital visits since last care coordination call/Pharmacist visit. (If appropriate, list visit date, provider name)  No  medication changes indicated OR if recent visit, treatment plan here.  BP Readings from Last 3 Encounters:  03/24/21 (!) 116/58  03/24/21 (!) 160/60  03/19/21 (!) 190/92    Lab Results  Component Value Date   HGBA1C 8.0 (A) 03/24/2021    Reviewed chart for medication changes ahead of medication coordination call.  BP Readings from Last 3 Encounters:  03/24/21 (!) 116/58  03/24/21 (!) 160/60  03/19/21 (!) 190/92    Lab Results  Component Value Date   HGBA1C 8.0 (A) 03/24/2021     Patient obtains medications through Vials  90 Days   Patient declined Reaptha due to:  Patient states she no longer takes this medication Reaptha  Patient is due for next adherence delivery on - No fill neded. Called patient and reviewed medication needs.  Patient declined need for refills at this time. Patient is aware of how to contact pharmacist if refills are needed prior to next adherence delivery.  Star Rating Drugs: N/A  Orinda Kenner, RMA Clinical Pharmacists Assistant [phone]  Time Spent: 68

## 2021-04-13 ENCOUNTER — Encounter: Payer: Self-pay | Admitting: Endocrinology

## 2021-04-13 DIAGNOSIS — M6281 Muscle weakness (generalized): Secondary | ICD-10-CM | POA: Diagnosis not present

## 2021-04-13 DIAGNOSIS — J9601 Acute respiratory failure with hypoxia: Secondary | ICD-10-CM | POA: Diagnosis not present

## 2021-04-14 ENCOUNTER — Ambulatory Visit (INDEPENDENT_AMBULATORY_CARE_PROVIDER_SITE_OTHER): Payer: Medicare Other | Admitting: *Deleted

## 2021-04-14 DIAGNOSIS — Z794 Long term (current) use of insulin: Secondary | ICD-10-CM

## 2021-04-14 DIAGNOSIS — N1831 Chronic kidney disease, stage 3a: Secondary | ICD-10-CM

## 2021-04-14 DIAGNOSIS — E1122 Type 2 diabetes mellitus with diabetic chronic kidney disease: Secondary | ICD-10-CM | POA: Diagnosis not present

## 2021-04-14 DIAGNOSIS — I5032 Chronic diastolic (congestive) heart failure: Secondary | ICD-10-CM | POA: Diagnosis not present

## 2021-04-14 NOTE — Patient Instructions (Signed)
Visit [name], it was nice talking with you today   Please read over [name] attached information, and keep up [name] great work keeping yourself on track in managing your health care at home: you are doing great!    I look forward to talking to you again for an update on Monday June 22, 2021 at 10:00 am   please be listening out for my call that day.  I will call as close to 10:00 am as possible; I look forward to hearing about your progress.   Please don't hesitate to contact me if I can be of assistance to you before our next scheduled appointment.   [name] Rack, RN, BSN, [name] RN Care Coordination- [name] [phone]: direct office [phone]: mobile    PATIENT GOALS: Goals Addressed            This Visit's Progress   . Monitor and Manage My Blood Sugar-Diabetes Type 2   On track    Timeframe:  Long-Range Goal Priority:  Medium Start Date:       02/17/21                      Expected End Date:       12/25/21 (goal re-established/ extended today, 04/14/21)               Follow Up Date 06/22/21  Great job getting your blood sugars back under control after your February 2022 hospital visit! Please continue to:    Marland Kitchen Check blood sugar at prescribed times, or if I feel it is too high or too low.  Keep up entering blood sugar readings and medication or insulin into daily log . Take [name] blood sugar log to all doctor visits . Continue your excellent efforts to follow low sugar/ carb modified diet-- good job controlling portion size: keep up [name] great work  . We talked about your recent A1-C level of 8.0 on 03/24/21--  this correlates to an average blood sugar of 180-- this is a great improvement from your last A1-C level of 9.5 (December 2021): keep up [name] great work! . Your next visit with your endocrinologist is scheduled for May 28, 2021 at 9:45 am . Continue light exercise with PT at [name] 4-5 days/week: great  job staying active with your PT/OT [name] and starting to walk now that [name] weather is nice- keep up [name] great work  . Continue your prescribed medication regimen  . Contacting care providers for new or worsened symptoms, concerns or questions   Why is this important?    Checking your blood sugar at home helps to keep it from getting very high or very low.   Writing [name] results in a diary or log helps [name] doctor know how to care for you.   Your blood sugar log should have [name] time, date and [name] results.   Also, write down [name] amount of insulin or other medicine that you take.   Other information, like what you ate, exercise done and how you were feeling, will also be helpful.         . Track and Manage Fluids and Swelling-Heart Failure   On track    Timeframe:  Long-Range Goal Priority:  Medium Start Date:         02/17/21                    Expected  End Date:       12/25/21  Goal re-established/ extended 04/14/21              Follow Up Date 05/22/21   . Continue using home oxygen as prescribed; It is wonderful to hear that you have been able to decrease your use of home O2-- keep up [name] great work! Doristine Devoid job re-filling your prescription for NTG-- I am glad that you have not needed to use [name] NTG recently; remember- if you ever have to use this medication, you should do so while seated or lying down: this medication can lower your blood pressure significantly . Continue to monitor your oxygen levels regularly at home: with activity and at rest, and if you ever start to feel unusually short of breath . Continue to weigh daily: you reported weights at home today consistently at 161 lbs; You are right on track with maintaining your goal weight- keep up [name] great work tracking weight in your health journal . Call office if I gain more than 2 pounds in one day or 5 pounds in one week . Continue using salt in moderation and keeping up with [name] amount of fluid you take in every day . Watch for  swelling in feet, ankles and legs every day: continue to wear [name] compression stockings- I am glad they are helping with [name] swelling you had been experiencing . I am glad that you enjoyed your visit with [name] [name] and that they are involved in your care and making regular home visits with you   Why is this important?    It is important to check your weight daily and watch how much salt and liquids you have.   It will help you to manage your heart failure.            Patient verbalizes understanding of instructions provided today and agrees to view in [name].   Telephone follow up appointment with care management [name] member scheduled for:  Monday June 22, 2021 at 10:00 am   [name] patient has been provided with contact information for [name] care management [name] and has been advised to call with any health related questions or concerns.   [name] Rack, RN, BSN, [name] RN Care Coordination- [name] [phone]: direct office [phone]: mobile

## 2021-04-14 NOTE — Chronic Care Management (AMB) (Signed)
Chronic Care Management   CCM RN Visit Note  04/14/2021 Name: [name] MRN: [id number] DOB: 18-Nov-1931  Subjective: [name] is a 87 y.o. year old female who is a primary care patient of Burns, [name], MD. The care management team was consulted for assistance with disease management and care coordination needs.    Engaged with patient by telephone for follow up visit in response to provider referral for case management and/or care coordination services.   Consent to Services:  The patient was given information about Chronic Care Management services, agreed to services, and gave verbal consent prior to initiation of services.  Please see initial visit note for detailed documentation.  Patient agreed to services and verbal consent obtained.   Assessment: Review of patient past medical history, allergies, medications, health status, including review of consultants reports, laboratory and other test data, was performed as part of comprehensive evaluation and provision of chronic care management services.   CCM Care Plan  Allergies  Allergen Reactions  . Levaquin [Levofloxacin] Shortness Of Breath and Swelling    angioedema  . Lobster [Shellfish Allergy] Other (See Comments)    angioedema  . Penicillins Rash    Has patient had a PCN reaction causing immediate rash, facial/tongue/throat swelling, SOB or lightheadedness with hypotension: Yes Has patient had a PCN reaction causing severe rash involving mucus membranes or skin necrosis: No Has patient had a PCN reaction that required hospitalization: No Has patient had a PCN reaction occurring within the last 10 years: No If all of the above answers are "NO", then may proceed with Cephalosporin use.  . Valsartan Other (See Comments)    REACTION: angioedema  . Codeine Other (See Comments)    Mental status changes  . Lipitor [Atorvastatin] Other (See Comments)    weakness  . Oxycodone Other (See Comments)     hallucinations  . Statins     Made her too weak  . Tramadol   . Amlodipine Besylate Other (See Comments)    REACTION: tingling in lips & gum edema 7/12: talked to patient, states she is tolerating well  . Crestor [Rosuvastatin Calcium] Rash  . Tramadol Hcl Nausea And Vomiting    Outpatient Encounter Medications as of 04/14/2021  Medication Sig Note  . acetaminophen (TYLENOL) 325 MG tablet Take 2 tablets (650 mg total) by mouth every 6 (six) hours as needed for mild pain or headache.   Marland Kitchen amLODipine (NORVASC) 2.5 MG tablet Take 1 tablet (2.5 mg total) by mouth daily.   . carvedilol (COREG) 6.25 MG tablet TAKE 1 TABLET BY MOUTH  TWICE DAILY WITH MEALS   . clopidogrel (PLAVIX) 75 MG tablet TAKE 1 TABLET BY MOUTH ONCE DAILY WITH BREAKFAST   . ELIQUIS 5 MG TABS tablet TAKE 1 TABLET BY MOUTH  TWICE DAILY   . glucose blood (ONETOUCH VERIO) test strip USE TO MONITOR GLUCOSE  LEVELS TWICE DAILY   . Insulin Lispro Prot & Lispro (HUMALOG MIX 75/25 KWIKPEN) (75-25) 100 UNIT/ML Kwikpen 28 units with breakfast and 8 units with dinner.   . Lancets (ONETOUCH DELICA PLUS [id number]) MISC USE TO MONITOR GLUCOSE  LEVELS TWICE DAILY   . Multiple Vitamins-Minerals (CENTRUM SILVER ULTRA WOMENS PO) Take 1 tablet by mouth daily.   . nitroGLYCERIN (NITROSTAT) 0.4 MG SL tablet Place 1 tablet (0.4 mg total) under the tongue every 5 (five) minutes as needed for chest pain. Reported on 01/28/2016 03/10/2021: 03/10/21: Reports has not needed recently  . NOVOFINE PLUS PEN NEEDLE  32G X 4 MM MISC SMARTSIG:1 Each SUB-Q Daily   . OXYGEN Inhale 3 % into the lungs continuous.   . potassium chloride SA (KLOR-CON) 20 MEQ tablet TAKE 2 TABLETS BY MOUTH  DAILY   . torsemide (DEMADEX) 20 MG tablet Take 2 tablets (40 mg total) by mouth 2 (two) times daily.    No facility-administered encounter medications on file as of 04/14/2021.    Patient Active Problem List   Diagnosis Date Noted  . Acute respiratory failure with hypoxia and  hypercapnia ([place]) 01/31/2021  . Elevated troponin 01/31/2021  . Type 2 diabetes mellitus with hyperlipidemia ([place]) 01/31/2021  . Neuropathy 08/19/2020  . PAD (peripheral artery disease) ([place]) 02/29/2020  . Chronic kidney disease (CKD) 02/20/2020  . Osteoarthritis 08/01/2019  . Arthritis of left sacroiliac joint 05/04/2017  . Osteopenia 02/17/2017  . Piriformis syndrome of left side 02/16/2017  . Acquired leg length discrepancy 02/16/2017  . COPD GOLD I  01/27/2017  . Lumbar radiculopathy, acute 01/27/2017  . Granuloma annulare 07/27/2016  . Numbness of fingers 07/27/2016  . Primary osteoarthritis of right knee 06/27/2016  . Rash and nonspecific skin eruption 06/09/2016  . Diabetes ([place]) 03/07/2016  . Acute on chronic combined systolic (congestive) and diastolic (congestive) heart failure ([place])   . OSA  08/28/2014  . Chronic diastolic heart failure ([place]) 07/30/2014  . Nocturnal hypoxemia 07/09/2014  . SOB (shortness of breath) 05/15/2014  . Pulmonary HTN ([place]) 05/15/2014  . Band keratopathy 01/16/2014  . 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 Hyperthyroidism 11/04/2011  . Multinodular goiter 10/14/2011  . Atrophy, Fuchs' 09/29/2011  . ATRIAL FIBRILLATION 06/03/2010  . VITILIGO 11/07/2009  . SINOATRIAL NODE DYSFUNCTION 06/16/2009  . Atrial flutter ([place]) 06/25/2008  . HYPERLIPIDEMIA 01/29/2008  . Coronary atherosclerosis 01/29/2008  . HTN (hypertension) 10/30/2007    Conditions to be addressed/monitored:CHF and DMII  Care Plan : Heart Failure (Adult)  Updates made by Knox Royalty, RN since 04/14/2021 12:00 AM    Problem: Symptom Exacerbation (Heart Failure)   Priority: High    Long-Range Goal: Symptom Exacerbation Prevented or Minimized   Start Date: 02/17/2021  Expected End Date: 12/25/2021  This Visit's Progress: On track  Recent Progress: On track  Priority: Medium  Note:   Current Barriers:  Marland Kitchen Knowledge deficits related to use of new home Oxygen and need  for ongoing reinforcement of basic heart failure pathophysiology and self care management . Unable to independently verbalize long-term plan for use of home O2, newly prescribed Nurse Case Manager Clinical Goal(s):  Over the next 90 days, patient will verbalize understanding of Heart Failure Action Plan and when to call doctor: goal met 04/14/21 04/14/21: Goal re-established/ modified/ extended Over the next 8 months, patient will: . Continue to monitor and record daily weight at home . Continue to use home O2 with activity and CPAP at night . Continue to follow action plan for development of signs/ symptoms CHF exacerbation/ yellow CHF zone Interventions:  . Collaboration with [name], MD regarding development and update of comprehensive plan of care as evidenced by provider attestation and co-signature . Inter-disciplinary care team collaboration (see longitudinal plan of care) . Chart reviewed including relevant office notes, upcoming scheduled appointments, and lab results . Discussed current  clinical condition with patient and confirmed no current clinical or medication concerns; confirmed patient continues to manage medications independently at home and reports adherence to medication regimen: confirmed patient has not needed to use NTG or "extra" diuretic since our  last outreach conversation . Reviewed recent CHF clinic office visit 03/19/21 and pulmonary office visit 03/24/21 with patient: confirmed she is able to accurately verbalize changes made to blood pressure medication and that she is taking as prescribed . Reviewed recent weights at home with patient: patient reports general weight ranges at home consistently at "161 lbs" since our last outreach conversation . Confirmed patient remains active with PT/OT at [facility] and is also walking in between PT sessions now that weather is nice . Confirmed patient continues using home O2 and has been able to wean home O2 at times: not having to  use when she is at rest; using now only with activity and HS; continues to use home CPAP qHS; continues using regularly at home between 2-3 L/min; continues monitoring/ recording blood pressures ("all very good and in range") during PT sessions and at home QD; continues monitoring/ recording SaO2 levels ("all in the 90's") QD and prn; continues wearing compression stockings QD . Confirmed patient able to verbalize with minimal prompting previously provided education around signs/ symptoms yellow CHF zone/ weight gain guidelines in setting of CHF along with corresponding action plan . Confirmed patient continues to endorse following low salt, heart healthy diet: discussed strategies that patient has recently taken to continue following low salt- diet; she reports her ILF is now not adding salt to prepared food . Reviewed recent home visit with [facility] team: confirmed patient happy with palliative care involvement: encouraged her ongoing engagement with Palliative Care team- scheduled for next week per patient report . Reviewed upcoming provider appointments with patient and confirmed that patient has plans to attend all as scheduled: May 16: pulmonary follow up; May 17: CCM Pharmacist phone call; June 2: endocrinology appointment; June 23: CHF follow up appointment.  Confirmed no transportation issues: ILF provides transportation and patient's daughter's also assists with transportation . Discussed plans with patient for ongoing care management follow up and confirmed patient has direct contact information for care management team Patient Goals/Self-Care Activities . Continue using home oxygen as prescribed; It is wonderful to hear that you have been able to decrease your use of home O2-- keep up the great work! Doristine Devoid job re-filling your prescription for NTG-- I am glad that you have not needed to use the NTG recently; remember- if you ever have to use this medication, you  should do so while seated or lying down: this medication can lower your blood pressure significantly . Continue to monitor your oxygen levels regularly at home: with activity and at rest, and if you ever start to feel unusually short of breath . Continue to weigh daily: you reported weights at home today consistently at 161 lbs; You are right on track with maintaining your goal weight- keep up the great work tracking weight in your health journal . Call office if I gain more than 2 pounds in one day or 5 pounds in one week . Continue using salt in moderation and keeping up with the amount of fluid you take in every day . Watch for swelling in feet, ankles and legs every day: continue to wear the compression stockings- I am glad they are helping with the swelling you had been experiencing . I am glad that you enjoyed your visit with the [facility] Team and that they are involved in your care and making regular home visits with you Follow Up Plan:  . Telephone follow up appointment with care management team member scheduled for: June 22, 2021 at 10:00 am . The patient has been provided with contact information for the care management team and has been advised to call with any health related questions or concerns.     Care Plan : Diabetes Type 2 (Adult)  Updates made by Knox Royalty, RN since 04/14/2021 12:00 AM    Problem: Glycemic Management (Diabetes, Type 2)   Priority: High    Long-Range Goal: Glycemic Management Optimized   Start Date: 02/17/2021  Expected End Date: 12/25/2021  This Visit's Progress: On track  Recent Progress: On track  Priority: Medium  Note:   Objective:  Lab Results  Component Value Date   HGBA1C 9.5 (A) 12/02/2020 .   Lab Results  Component Value Date   CREATININE 0.84 02/05/2021   CREATININE 0.79 02/04/2021   CREATININE 0.84 02/03/2021 .   Marland Kitchen No results found for: EGFR Current Barriers:  Marland Kitchen Knowledge Deficits related to basic Diabetes  pathophysiology and self care/management: patient could benefit from ongoing reinforcement of self-health management strategies for DM . Knowledge Deficits related to medications used for management of diabetes- patient actively engaged with CCM Pharmacist at PCP office . No self-care deficits: patient resides at independent living facility and has support if needed; patient verbalizes accurate general understanding of plan of care post- recent hospital discharge 02/05/21 Case Manager Clinical Goal(s):  04/14/21: goal re-established/ extended Over the next 8 months, patient will demonstrate ongoing adherence to prescribed treatment plan for diabetes self care/management as evidenced by:  . daily monitoring and recording of CBG twice daily  . adherence to ADA/ carb modified diet  . Light exercise with established PT at [facility] facility 4-5 days/week; walking when weather is nice  . adherence to prescribed medication regimen  . contacting care providers for new or worsened symptoms, concerns or questions Interventions:  . Collaboration with [name], MD regarding development and update of comprehensive plan of care as evidenced by provider attestation and co-signature . Inter-disciplinary care team collaboration (see longitudinal plan of care) . Review of patient status, including review of consultants reports, relevant laboratory and other test results, and medications completed . Reviewed recent office visit with endocrinologist 03/24/21 with patient: confirmed that she is able to verbalize changes made to insulin dosing HS, and confirmed that she is taking insulin as prescribed and has no questions/ concerns.  Discussed her plan to obtain additional fingerstick monitoring needles and confirmed that she has contacted endocrinologist for same- positive reinforcement provided . Discussed patient's most recently obtained A1-C level of 8.0 (03/24/21): discussed that this value represents  excellent improvement in previous A1-C of 9.5 in December 2021: discussed general correlation of average blood sugars at home to A1-C levels . Reviewed recent fasting blood sugars with patient: she reports ranges at home consistently between 109-153; post-prandial values between 170-203: positive reinforcement provided for patient continuing to monitor/ record blood sugars.  Confirmed no recent low blood sugars at home . Discussed patient's recent strategy to limit portion sizes, change eating habits, increase activity to decrease blood sugars at home . Reviewed upcoming follow up endocrinology appointment with patient scheduled for May 28, 2021: confirmed she has plans to attend as scheduled . Discussed plans with patient for ongoing care management follow up and confirmed patient has direct contact information for care management team Patient Goals: . Check blood sugar at prescribed times, or if I feel it is too high or too low.  Keep up entering blood sugar readings and medication or insulin  into daily log . Take the blood sugar log to all doctor visits . Continue your excellent efforts to follow low sugar/ carb modified diet-- good job controlling portion size: keep up the great work  . We talked about your recent A1-C level of 8.0 on 03/24/21--  this correlates to an average blood sugar of 180-- this is a great improvement from your last A1-C level of 9.5 (December 2021): keep up the great work! . Your next visit with your endocrinologist is scheduled for May 28, 2021 at 9:45 am . Continue light exercise with PT at [facility] 4-5 days/week: great job staying active with your PT/OT team and starting to walk now that the weather is nice- keep up the great work  . Continue your prescribed medication regimen  . Contacting care providers for new or worsened symptoms, concerns or questions Self-Care Activities . Self administers oral medications as prescribed . Self administers insulin  as prescribed . Attends all scheduled provider appointments . Checks blood sugars as prescribed . Adheres to prescribed ADA/carb modified and is motivated to ongoing learning around same Follow Up Plan:  . Telephone follow up appointment with care management team member scheduled for: 06/22/21 at 10:00 am . The patient has been provided with contact information for the care management team and has been advised to call with any health related questions or concerns.      Plan:  Telephone follow up appointment with care management team member scheduled for: Monday June 22, 2021 at 10:00 am   The patient has been provided with contact information for the care management team and has been advised to call with any health related questions or concerns.   [name], RN, BSN, [facility] RN Care Coordination- [place] [phone]: direct office [phone]: mobile

## 2021-04-15 ENCOUNTER — Other Ambulatory Visit: Payer: Self-pay | Admitting: Endocrinology

## 2021-04-15 DIAGNOSIS — M6281 Muscle weakness (generalized): Secondary | ICD-10-CM | POA: Diagnosis not present

## 2021-04-15 DIAGNOSIS — J9601 Acute respiratory failure with hypoxia: Secondary | ICD-10-CM | POA: Diagnosis not present

## 2021-04-15 DIAGNOSIS — N183 Chronic kidney disease, stage 3 unspecified: Secondary | ICD-10-CM

## 2021-04-15 MED ORDER — ONETOUCH VERIO VI STRP: 1.0000 | ORAL_STRIP | Freq: Two times a day (BID) | 3 refills | Status: DC

## 2021-04-16 ENCOUNTER — Other Ambulatory Visit: Payer: Self-pay

## 2021-04-16 DIAGNOSIS — M6281 Muscle weakness (generalized): Secondary | ICD-10-CM | POA: Diagnosis not present

## 2021-04-16 DIAGNOSIS — R262 Difficulty in walking, not elsewhere classified: Secondary | ICD-10-CM | POA: Diagnosis not present

## 2021-04-16 DIAGNOSIS — J449 Chronic obstructive pulmonary disease, unspecified: Secondary | ICD-10-CM | POA: Diagnosis not present

## 2021-04-16 MED ORDER — NOVOFINE PLUS PEN NEEDLE 32G X 4 MM MISC: 0 refills | Status: DC

## 2021-04-16 NOTE — Telephone Encounter (Signed)
Patient has been trying to get NOVOFINE PLUS PEN NEEDLE 32G X 4 MM called into the [facility] on [address] - needs a gap RX until [facility] is able to get her pen needles refilled and shipped to her  Any questions # [phone]

## 2021-04-17 ENCOUNTER — Other Ambulatory Visit: Payer: Self-pay | Admitting: *Deleted

## 2021-04-17 DIAGNOSIS — J449 Chronic obstructive pulmonary disease, unspecified: Secondary | ICD-10-CM | POA: Diagnosis not present

## 2021-04-17 DIAGNOSIS — J9601 Acute respiratory failure with hypoxia: Secondary | ICD-10-CM | POA: Diagnosis not present

## 2021-04-17 DIAGNOSIS — M6281 Muscle weakness (generalized): Secondary | ICD-10-CM | POA: Diagnosis not present

## 2021-04-17 DIAGNOSIS — R262 Difficulty in walking, not elsewhere classified: Secondary | ICD-10-CM | POA: Diagnosis not present

## 2021-04-17 MED ORDER — NOVOFINE PLUS PEN NEEDLE 32G X 4 MM MISC: 0 refills | Status: DC

## 2021-04-17 NOTE — Telephone Encounter (Signed)
Pt called and states that the pharmacy has not gotten the prescription yet and that pt test twice a day instead of once a day. Pt needs the prescription sent to the [address] [facility]. Pt states if any questions call pt back.

## 2021-04-20 DIAGNOSIS — R262 Difficulty in walking, not elsewhere classified: Secondary | ICD-10-CM | POA: Diagnosis not present

## 2021-04-20 DIAGNOSIS — J449 Chronic obstructive pulmonary disease, unspecified: Secondary | ICD-10-CM | POA: Diagnosis not present

## 2021-04-20 DIAGNOSIS — M6281 Muscle weakness (generalized): Secondary | ICD-10-CM | POA: Diagnosis not present

## 2021-04-21 DIAGNOSIS — M6281 Muscle weakness (generalized): Secondary | ICD-10-CM | POA: Diagnosis not present

## 2021-04-21 DIAGNOSIS — J9601 Acute respiratory failure with hypoxia: Secondary | ICD-10-CM | POA: Diagnosis not present

## 2021-04-22 DIAGNOSIS — J9601 Acute respiratory failure with hypoxia: Secondary | ICD-10-CM | POA: Diagnosis not present

## 2021-04-22 DIAGNOSIS — M6281 Muscle weakness (generalized): Secondary | ICD-10-CM | POA: Diagnosis not present

## 2021-04-23 DIAGNOSIS — J9601 Acute respiratory failure with hypoxia: Secondary | ICD-10-CM | POA: Diagnosis not present

## 2021-04-23 DIAGNOSIS — R262 Difficulty in walking, not elsewhere classified: Secondary | ICD-10-CM | POA: Diagnosis not present

## 2021-04-23 DIAGNOSIS — J449 Chronic obstructive pulmonary disease, unspecified: Secondary | ICD-10-CM | POA: Diagnosis not present

## 2021-04-23 DIAGNOSIS — M6281 Muscle weakness (generalized): Secondary | ICD-10-CM | POA: Diagnosis not present

## 2021-04-25 ENCOUNTER — Other Ambulatory Visit: Payer: Self-pay

## 2021-04-25 MED ORDER — NOVOFINE PLUS PEN NEEDLE 32G X 4 MM MISC: 1 refills | Status: DC

## 2021-04-27 DIAGNOSIS — M6281 Muscle weakness (generalized): Secondary | ICD-10-CM | POA: Diagnosis not present

## 2021-04-27 DIAGNOSIS — J9601 Acute respiratory failure with hypoxia: Secondary | ICD-10-CM | POA: Diagnosis not present

## 2021-04-28 DIAGNOSIS — M6281 Muscle weakness (generalized): Secondary | ICD-10-CM | POA: Diagnosis not present

## 2021-04-28 DIAGNOSIS — R262 Difficulty in walking, not elsewhere classified: Secondary | ICD-10-CM | POA: Diagnosis not present

## 2021-04-28 DIAGNOSIS — J449 Chronic obstructive pulmonary disease, unspecified: Secondary | ICD-10-CM | POA: Diagnosis not present

## 2021-04-29 DIAGNOSIS — J9601 Acute respiratory failure with hypoxia: Secondary | ICD-10-CM | POA: Diagnosis not present

## 2021-04-29 DIAGNOSIS — M6281 Muscle weakness (generalized): Secondary | ICD-10-CM | POA: Diagnosis not present

## 2021-05-01 DIAGNOSIS — J449 Chronic obstructive pulmonary disease, unspecified: Secondary | ICD-10-CM | POA: Diagnosis not present

## 2021-05-01 DIAGNOSIS — R262 Difficulty in walking, not elsewhere classified: Secondary | ICD-10-CM | POA: Diagnosis not present

## 2021-05-01 DIAGNOSIS — M6281 Muscle weakness (generalized): Secondary | ICD-10-CM | POA: Diagnosis not present

## 2021-05-02 ENCOUNTER — Encounter: Payer: Self-pay | Admitting: Endocrinology

## 2021-05-04 NOTE — Telephone Encounter (Signed)
Pt called because she wanted to make sure Dr.Ellison knew the question she was asking. Pt would like to know since she was in the hospital they changed her to take her insulin twice a day instead of once a day, should she stick with taking her novolog twice a day as they instructed or can she go back to just taking it once a day?  Pt requests a MyChart message be sent with the answer.

## 2021-05-05 DIAGNOSIS — J9602 Acute respiratory failure with hypercapnia: Secondary | ICD-10-CM | POA: Diagnosis not present

## 2021-05-05 DIAGNOSIS — M6281 Muscle weakness (generalized): Secondary | ICD-10-CM | POA: Diagnosis not present

## 2021-05-05 DIAGNOSIS — G4733 Obstructive sleep apnea (adult) (pediatric): Secondary | ICD-10-CM | POA: Diagnosis not present

## 2021-05-05 DIAGNOSIS — J9601 Acute respiratory failure with hypoxia: Secondary | ICD-10-CM | POA: Diagnosis not present

## 2021-05-05 DIAGNOSIS — I5033 Acute on chronic diastolic (congestive) heart failure: Secondary | ICD-10-CM | POA: Diagnosis not present

## 2021-05-05 DIAGNOSIS — J449 Chronic obstructive pulmonary disease, unspecified: Secondary | ICD-10-CM | POA: Diagnosis not present

## 2021-05-05 DIAGNOSIS — R262 Difficulty in walking, not elsewhere classified: Secondary | ICD-10-CM | POA: Diagnosis not present

## 2021-05-06 ENCOUNTER — Telehealth: Payer: Self-pay | Admitting: Pharmacist

## 2021-05-06 DIAGNOSIS — J449 Chronic obstructive pulmonary disease, unspecified: Secondary | ICD-10-CM | POA: Diagnosis not present

## 2021-05-06 DIAGNOSIS — I5033 Acute on chronic diastolic (congestive) heart failure: Secondary | ICD-10-CM | POA: Diagnosis not present

## 2021-05-06 DIAGNOSIS — R262 Difficulty in walking, not elsewhere classified: Secondary | ICD-10-CM | POA: Diagnosis not present

## 2021-05-06 DIAGNOSIS — J9601 Acute respiratory failure with hypoxia: Secondary | ICD-10-CM | POA: Diagnosis not present

## 2021-05-06 DIAGNOSIS — J9602 Acute respiratory failure with hypercapnia: Secondary | ICD-10-CM | POA: Diagnosis not present

## 2021-05-06 DIAGNOSIS — G4733 Obstructive sleep apnea (adult) (pediatric): Secondary | ICD-10-CM | POA: Diagnosis not present

## 2021-05-06 DIAGNOSIS — M6281 Muscle weakness (generalized): Secondary | ICD-10-CM | POA: Diagnosis not present

## 2021-05-06 NOTE — Progress Notes (Signed)
    Chronic Care Management Pharmacy Assistant   Name: FRANCILLE WITTMANN  MRN: [id number] DOB: 07-13-1931   Reason for Encounter: Medication Coordination Call    Medications: Outpatient Encounter Medications as of 05/06/2021  Medication Sig Note  . acetaminophen (TYLENOL) 325 MG tablet Take 2 tablets (650 mg total) by mouth every 6 (six) hours as needed for mild pain or headache.   Marland Kitchen amLODipine (NORVASC) 2.5 MG tablet Take 1 tablet (2.5 mg total) by mouth daily.   . carvedilol (COREG) 6.25 MG tablet TAKE 1 TABLET BY MOUTH  TWICE DAILY WITH MEALS   . clopidogrel (PLAVIX) 75 MG tablet TAKE 1 TABLET BY MOUTH ONCE DAILY WITH BREAKFAST   . ELIQUIS 5 MG TABS tablet TAKE 1 TABLET BY MOUTH  TWICE DAILY   . glucose blood (ONETOUCH VERIO) test strip 1 each by Other route 2 (two) times daily. And lancets 2/day   . Insulin Lispro Prot & Lispro (HUMALOG MIX 75/25 KWIKPEN) (75-25) 100 UNIT/ML Kwikpen 28 units with breakfast and 8 units with dinner.   . Lancets (ONETOUCH DELICA PLUS [id number]) MISC USE TO MONITOR GLUCOSE  LEVELS TWICE DAILY   . Multiple Vitamins-Minerals (CENTRUM SILVER ULTRA WOMENS PO) Take 1 tablet by mouth daily.   . nitroGLYCERIN (NITROSTAT) 0.4 MG SL tablet Place 1 tablet (0.4 mg total) under the tongue every 5 (five) minutes as needed for chest pain. Reported on 01/28/2016 03/10/2021: 03/10/21: Reports has not needed recently  . NOVOFINE PLUS PEN NEEDLE 32G X 4 MM MISC SMARTSIG:1 Each SUB-Q Daily   . OXYGEN Inhale 3 % into the lungs continuous.   . potassium chloride SA (KLOR-CON) 20 MEQ tablet TAKE 2 TABLETS BY MOUTH  DAILY   . torsemide (DEMADEX) 20 MG tablet Take 2 tablets (40 mg total) by mouth 2 (two) times daily.    No facility-administered encounter medications on file as of 05/06/2021.    Reviewed chart for medication changes ahead of medication coordination call.  No OVs, Consults, or hospital visits since last care coordination call/Pharmacist visit. (If appropriate, list  visit date, provider name)  No medication changes indicated OR if recent visit, treatment plan here.  BP Readings from Last 3 Encounters:  03/24/21 (!) 116/58  03/24/21 (!) 160/60  03/19/21 (!) 190/92    Lab Results  Component Value Date   HGBA1C 8.0 (A) 03/24/2021     Patient declined the following medications:  Repatha due to no longer taking. Patient is transitioning to palliative care.  Patient is due for next adherence delivery on - No fill needed.  Patient is aware of how to contact pharmacist if refills are needed prior to next adherence delivery.  Star Rating Drugs: N/A   Orinda Kenner, RMA Clinical Pharmacists Assistant [phone]  Time Spent: 86

## 2021-05-08 DIAGNOSIS — M6281 Muscle weakness (generalized): Secondary | ICD-10-CM | POA: Diagnosis not present

## 2021-05-08 DIAGNOSIS — J9601 Acute respiratory failure with hypoxia: Secondary | ICD-10-CM | POA: Diagnosis not present

## 2021-05-11 ENCOUNTER — Telehealth (HOSPITAL_COMMUNITY): Payer: Self-pay | Admitting: Pharmacy Technician

## 2021-05-11 ENCOUNTER — Encounter (HOSPITAL_COMMUNITY): Payer: Self-pay

## 2021-05-11 ENCOUNTER — Other Ambulatory Visit: Payer: Self-pay

## 2021-05-11 ENCOUNTER — Ambulatory Visit: Payer: Medicare Other | Admitting: Adult Health

## 2021-05-11 ENCOUNTER — Encounter: Payer: Self-pay | Admitting: Adult Health

## 2021-05-11 VITALS — BP 162/80 | HR 79 | Temp 97.4°F | Ht 62.0 in | Wt 168.2 lb

## 2021-05-11 DIAGNOSIS — Z9989 Dependence on other enabling machines and devices: Secondary | ICD-10-CM

## 2021-05-11 DIAGNOSIS — J9611 Chronic respiratory failure with hypoxia: Secondary | ICD-10-CM | POA: Diagnosis not present

## 2021-05-11 DIAGNOSIS — I5032 Chronic diastolic (congestive) heart failure: Secondary | ICD-10-CM | POA: Diagnosis not present

## 2021-05-11 DIAGNOSIS — J449 Chronic obstructive pulmonary disease, unspecified: Secondary | ICD-10-CM

## 2021-05-11 DIAGNOSIS — G4733 Obstructive sleep apnea (adult) (pediatric): Secondary | ICD-10-CM

## 2021-05-11 NOTE — Patient Instructions (Addendum)
Continue on Oxygen 2l/m with activity and At bedtime  With CPAP .  Goal is to keep sats >88-90%.  Continue on CPAP At bedtime  With oxygen .  Keep up good work .  Low salt diet .  Activity as tolerated.  Keep follow up with Cardiology as planned  Follow up with Dr. Halford Chessman or Dashonna Chagnon NP in 4 months and As needed

## 2021-05-11 NOTE — Progress Notes (Signed)
@Patient  ID: [name], female    DOB: [birth year], 87 y.o.   MRN: [id number]    Referring provider: [name], MD  HPI: 87 year old female former smoker followed for COPD with emphysema and obstructive sleep apnea Medical history significant for diastolic heart failure  TEST/EVENTS :  Pulmonary tests:   PFT 05/21/14 >> FEV1 1.37 (94%), FEV1% 66, TLC 4.69 (95%), DLCO 39%  Chest imaging:   CT chest 05/24/15 >> mild centrilobular emphysema  CT chest 05/25/17 >> moderate centrilobular emphysema, atherosclerosis  Sleep tests:   PSG 7/14//15 >> AHI 37  HST 02/21/17 >> AHI 30.5, SaO2 low 78%  Auto CPAP 10/23/17 to 11/21/17 >>used on 30 of 30 nights with average 6 hrs 34 min. Average AHI 1 with median CPAP 8 and 95 th percentile CPAP 11 cm H2O  HST 03/14/20 >> AHI 15.3, SpO low 79%  Auto CPAP 06/15/20 to 07/14/20 >>used on 30 of 30 nights with average 7 hrs 20 min. Average AHI 7 with median CPAP 7 and 95 th percentile CPAP 14 cm H2O.  Cardiac tests:   Echo 05/16/20 >> EF 50 to 55%, mod LVH, grade 3 DD, mild MR    05/11/2021 Follow up : COPD and OSA  Patient returns for a 25-month follow-up.  Patient has underlying COPD and OSA. Since last visit patient says she is doing some better.  She lives at independent living at [facility].  She is followed by palliative care.  Previously has been on Spiriva but felt it was too expensive.  Does not really have any increased cough congestion wheezing or change in activity tolerance. She is doing physical therapy and Occupational Therapy 2-3 times a week.  She uses a rolling walker with a bench seat which seems to help quite a bit.  She remains on oxygen 2 L at rest and uses 3 L with activity at times.  She also wears oxygen 2 L with her CPAP at bedtime. Patient would like to try a portable oxygen concentrator to help her be more independent and help her with mobility.  Today in the office walk test showed O2 saturations  required 3 L on POC to maintain O2 saturations greater than 88 to 90%. O2 saturations on room air 87%.  Patient continues on her CPAP at bedtime.  Wears it each night.  Denies any known issues.  Feels that she is rested.  And benefits from her CPAP.  Allergies  Allergen Reactions  . Levaquin [Levofloxacin] Shortness Of Breath and Swelling    angioedema  . Lobster [Shellfish Allergy] Other (See Comments)    angioedema  . Penicillins Rash    Has patient had a PCN reaction causing immediate rash, facial/tongue/throat swelling, SOB or lightheadedness with hypotension: Yes Has patient had a PCN reaction causing severe rash involving mucus membranes or skin necrosis: No Has patient had a PCN reaction that required hospitalization: No Has patient had a PCN reaction occurring within the last 10 years: No If all of the above answers are "NO", then may proceed with Cephalosporin use.  . Valsartan Other (See Comments)    REACTION: angioedema  . Codeine Other (See Comments)    Mental status changes  . Lipitor [Atorvastatin] Other (See Comments)    weakness  . Oxycodone Other (See Comments)    hallucinations  . Statins     Made her too weak  . Tramadol   . Amlodipine Besylate Other (See Comments)    REACTION: tingling in lips &  gum edema 7/12: talked to patient, states she is tolerating well  . Crestor [Rosuvastatin Calcium] Rash  . Tramadol Hcl Nausea And Vomiting    Immunization History  Administered Date(s) Administered  . Fluad Quad(high Dose 65+) 08/22/2019  . Influenza Split 11/16/2011, 09/26/2012  . Influenza Whole 12/27/2004, 10/21/2008, 10/20/2009, 11/11/2010  . Influenza, High Dose Seasonal PF 10/14/2016, 10/10/2017, 09/28/2018  . Influenza,inj,Quad PF,6+ Mos 09/24/2013, 12/16/2014, 09/19/2015  . Moderna SARS-COV2 Booster Vaccination 11/03/2020  . Moderna Sars-Covid-2 Vaccination 02/08/2020, 03/10/2020  . PPD Test 05/31/2015, 07/07/2016  . Pneumococcal Conjugate-13  07/31/2018  . Pneumococcal Polysaccharide-23 12/16/2014  . Pneumococcal-Unspecified 05/31/2015  . Td 06/15/2010    Past Medical History:  Diagnosis Date  . Anemia    iron defficiency  . Anginal pain ([place])   . Arthritis    "knees, feet, hands; joints" (05/27/2015)  . Asthma   . Atrial fibrillation or flutter    s/p RFCA 7/08;   s/p DCCV in past;   previously on amiodarone;  amio stopped due to lung toxicity  . CAD (coronary artery disease)    s/p NSTEMI tx with BMS to OM1 3/08;  cath 3/08: pOM 99% tx with PCI, pLAD 20%, ? mod stenosis at the AM  . CAP (community acquired pneumonia) 05/24/2015  . CHF (congestive heart failure) ([place])   . Chronic diastolic heart failure (HCC)    echo 11/11:  EF 55-60%, severe LVH, mod LAE, mild MR, mildly increased PASP  . COPD (chronic obstructive pulmonary disease) ([place])   . Degenerative joint disease   . Dystrophy, corneal stromal   . Heart murmur   . HLD (hyperlipidemia)   . HTN (hypertension)    essential nos  . Hypopotassemia    PMH of  . Muscle pain   . Myocardial infarction ([place]) 2008  . OSA on CPAP   . Osteoporosis   . Pneumonia 05/27/2015  . Protein calorie malnutrition ([place])   . Rash and nonspecific skin eruption    both arms,awaiting bio   dr Delman Cheadle  . Seasonal allergies   . Shortness of breath dyspnea   . Type II diabetes mellitus ([place])    Dr Loanne Drilling    Tobacco History: Social History   Tobacco Use  Smoking Status Former Smoker  . Packs/day: 0.25  . Years: 18.00  . Pack years: 4.50  . Types: Cigarettes  . Quit date: 12/27/1968  . Years since quitting: 52.4  Smokeless Tobacco Never Used  Tobacco Comment   smoked [place], up to 1 pp week   Counseling given: Not Answered Comment: smoked [place], up to 1 pp week   Outpatient Medications Prior to Visit  Medication Sig Dispense Refill  . acetaminophen (TYLENOL) 325 MG tablet Take 2 tablets (650 mg total) by mouth every 6 (six) hours as needed for mild pain or  headache. 20 tablet 0  . amLODipine (NORVASC) 2.5 MG tablet Take 1 tablet (2.5 mg total) by mouth daily. 90 tablet 3  . apixaban (ELIQUIS) 2.5 MG TABS tablet     . carvedilol (COREG) 6.25 MG tablet TAKE 1 TABLET BY MOUTH  TWICE DAILY WITH MEALS 180 tablet 3  . clopidogrel (PLAVIX) 75 MG tablet TAKE 1 TABLET BY MOUTH ONCE DAILY WITH BREAKFAST 90 tablet 3  . ELIQUIS 5 MG TABS tablet TAKE 1 TABLET BY MOUTH  TWICE DAILY 60 tablet 11  . glucose blood (ONETOUCH VERIO) test strip 1 each by Other route 2 (two) times daily. And lancets 2/day 200 strip  3  . insulin NPH Human (NOVOLIN N) 100 UNIT/ML injection     . Lancets (ONETOUCH DELICA PLUS IONGEX52W) MISC USE TO MONITOR GLUCOSE  LEVELS TWICE DAILY 200 each 3  . Multiple Vitamins-Minerals (CENTRUM SILVER ULTRA WOMENS PO) Take 1 tablet by mouth daily.    . nitroGLYCERIN (NITROSTAT) 0.4 MG SL tablet Place 1 tablet (0.4 mg total) under the tongue every 5 (five) minutes as needed for chest pain. Reported on 01/28/2016 30 tablet 3  . NOVOFINE PLUS PEN NEEDLE 32G X 4 MM MISC SMARTSIG:1 Each SUB-Q Daily 200 each 1  . OXYGEN Inhale 3 % into the lungs continuous.    . potassium chloride SA (KLOR-CON) 20 MEQ tablet TAKE 2 TABLETS BY MOUTH  DAILY 180 tablet 3  . torsemide (DEMADEX) 20 MG tablet Take 2 tablets (40 mg total) by mouth 2 (two) times daily. 120 tablet 3  . Turmeric 500 MG CAPS     . furosemide (LASIX) 20 MG tablet 1 tablet    . Insulin Lispro Prot & Lispro (HUMALOG MIX 75/25 KWIKPEN) (75-25) 100 UNIT/ML Kwikpen 28 units with breakfast and 8 units with dinner. (Patient not taking: Reported on 05/11/2021) 15 mL 11   No facility-administered medications prior to visit.     Review of Systems:   Constitutional:   No  weight loss, night sweats,  Fevers, chills,  +fatigue, or  lassitude.  HEENT:   No headaches,  Difficulty swallowing,  Tooth/dental problems, or  Sore throat,                No sneezing, itching, ear ache, nasal congestion, post nasal  drip,   CV:  No chest pain,  Orthopnea, PND, swelling in lower extremities, anasarca, dizziness, palpitations, syncope.   GI  No heartburn, indigestion, abdominal pain, nausea, vomiting, diarrhea, change in bowel habits, loss of appetite, bloody stools.   Resp:   No change in color of mucus.  No wheezing.  No chest wall deformity  Skin: no rash or lesions.  GU: no dysuria, change in color of urine, no urgency or frequency.  No flank pain, no hematuria   MS:  No joint pain or swelling.  No decreased range of motion.  No back pain. Balance issues    Physical Exam  BP (!) 162/80 (BP Location: Left Arm, Patient Position: Sitting, Cuff Size: Normal)   Pulse 79   Temp (!) 97.4 F (36.3 C) (Temporal)   Ht 5\' 2"  (1.575 m)   Wt 168 lb 3.2 oz (76.3 kg)   SpO2 99%   BMI 30.76 kg/m   GEN: A/Ox3; pleasant , NAD, elderly , O2 , rolling walker    HEENT:  [place]/AT,   NOSE-clear, THROAT-clear, no lesions, no postnasal drip or exudate noted.   NECK:  Supple w/ fair ROM; no JVD; normal carotid impulses w/o bruits; no thyromegaly or nodules palpated; no lymphadenopathy.    RESP  Clear  P & A; w/o, wheezes/ rales/ or rhonchi. no accessory muscle use, no dullness to percussion  CARD:  RRR, no m/r/g, tr  peripheral edema, pulses intact, no cyanosis or clubbing.  GI:   Soft & nt; nml bowel sounds; no organomegaly or masses detected.   Musco: Warm bil, no deformities or joint swelling noted.   Neuro: alert, no focal deficits noted.    Skin: Warm, no lesions or rashes    Lab Results:   BNP  Imaging: No results found.    PFT Results Latest Ref Rng &  Units 05/21/2014  FVC-Pre L 2.10  FVC-Predicted Pre % 110  FVC-Post L 2.09  FVC-Predicted Post % 110  Pre FEV1/FVC % % 67  Post FEV1/FCV % % 66  FEV1-Pre L 1.40  FEV1-Predicted Pre % 95  FEV1-Post L 1.37  DLCO uncorrected ml/min/mmHg 8.97  DLCO UNC% % 39  DLCO corrected ml/min/mmHg 8.97  DLCO COR %Predicted % 39  DLVA Predicted %  54  TLC L 4.69  TLC % Predicted % 95  RV % Predicted % 117    No results found for: NITRICOXIDE      Assessment & Plan:   No problem-specific Assessment & Plan notes found for this encounter.     Rexene Edison, NP 05/11/2021

## 2021-05-11 NOTE — Telephone Encounter (Signed)
Advanced Heart Failure Patient Advocate Encounter  I received a message from the patient requesting samples for Eliquis, her current 30 day co-pay is $200.  Neville Route, (RN) a request for a month of samples. Looking at notes from 2021, she had an OOP $643.08 to be met in order to qualify for assistance. Unless she has had a change in income, she would need to have spent at least this much in 2022.   Called and left the patient message.   Charlann Boxer, CPhT

## 2021-05-11 NOTE — Progress Notes (Signed)
Medication Samples have been provided to the patient.  Drug name: Eliquis       Strength: 5 mg        Qty: 4  LOT: [id number]  Exp.Date: 04/2023  Dosing instructions: 1 tablet Twice daily   The patient has been instructed regarding the correct time, dose, and frequency of taking this medication, including desired effects and most common side effects.   [name] Doss Cybulski 12:05 PM 05/11/2021

## 2021-05-12 ENCOUNTER — Telehealth: Payer: Medicare Other

## 2021-05-12 DIAGNOSIS — J449 Chronic obstructive pulmonary disease, unspecified: Secondary | ICD-10-CM | POA: Diagnosis not present

## 2021-05-12 DIAGNOSIS — M6281 Muscle weakness (generalized): Secondary | ICD-10-CM | POA: Diagnosis not present

## 2021-05-12 DIAGNOSIS — J9611 Chronic respiratory failure with hypoxia: Secondary | ICD-10-CM | POA: Insufficient documentation

## 2021-05-12 DIAGNOSIS — J9601 Acute respiratory failure with hypoxia: Secondary | ICD-10-CM | POA: Diagnosis not present

## 2021-05-12 DIAGNOSIS — R262 Difficulty in walking, not elsewhere classified: Secondary | ICD-10-CM | POA: Diagnosis not present

## 2021-05-12 NOTE — Assessment & Plan Note (Signed)
Compensated currently.  Patient is not on any maintenance therapy.  We will continue to follow closely.  Continue with activity and oxygen  Plan  Patient Instructions  Continue on Oxygen 2l/m with activity and At bedtime  With CPAP .  Goal is to keep sats >88-90%.  Continue on CPAP At bedtime  With oxygen .  Keep up good work .  Low salt diet .  Activity as tolerated.  Keep follow up with Cardiology as planned  Follow up with Dr. Halford Chessman or Janeann Paisley NP in 4 months and As needed

## 2021-05-12 NOTE — Assessment & Plan Note (Signed)
Continue on CPAP at bedtime 

## 2021-05-12 NOTE — Progress Notes (Deleted)
Chronic Care Management Pharmacy Note  05/12/2021 Name:  [name] MRN:  [id number] DOB:  08/28/1931  Subjective: [name] is an 87 y.o. year old female who is a primary patient of Burns, [name], MD.  The CCM team was consulted for assistance with disease management and care coordination needs.    Engaged with patient by telephone for follow up visit in response to provider referral for pharmacy case management and/or care coordination services.   Consent to Services:  The patient was given the following information about Chronic Care Management services today, agreed to services, and gave verbal consent: 1. CCM service includes personalized support from designated clinical staff supervised by the primary care provider, including individualized plan of care and coordination with other care providers 2. 24/7 contact phone numbers for assistance for urgent and routine care needs. 3. Service will only be billed when office clinical staff spend 20 minutes or more in a month to coordinate care. 4. Only one practitioner may furnish and bill the service in a calendar month. 5.The patient may stop CCM services at any time (effective at the end of the month) by phone call to the office staff. 6. The patient will be responsible for cost sharing (co-pay) of up to 20% of the service fee (after annual deductible is met). Patient agreed to services and consent obtained.  Patient Care Team: [name], MD as PCP - General (Internal Medicine) Debara Pickett Nadean Corwin, MD as PCP - Cardiology (Cardiology) Bensimhon, Shaune Pascal, MD as PCP - Advanced Heart Failure (Cardiology) Chesley Mires, MD as Consulting Physician (Pulmonary Disease) Renato Shin, MD as Consulting Physician (Endocrinology) Bensimhon, Shaune Pascal, MD as Consulting Physician (Cardiology) Charlton Haws, [facility] as Pharmacist (Pharmacist) Knox Royalty, RN as Case Manager  Born in [place], lived in [place] for 40 years and moved back here  25 years ago. Son and Daughter, 2 grandchildren. 5 months ago moved into a retirement community. 3 chef-cooked meals, activities, exercise, yoga, games, concerts, carnivals.    Recent office visits: 02/20/20 Dr Quay Burow OV: referred to allergist for rash, referred to podiatry for foot pain. Referred to pulmonary to adjust CPAP.  Recent consult visits: 12/18/20 Dr Haroldine Laws (CHF): increased amlodipine to 10 mg (BP 160/88). Also provided Eliquis samples.  12/02/20 Dr Loanne Drilling (endocrine): changed insulin to Humalog 75/25 36 units daily w/ breakfast. Hx AM hypoglycemia and PM hyperglycemia.  08/13/20 Dr Haroldine Laws (CHF clinic): chronic f/u for CAD, CHF, PAH, Afib - conditions stable, no med changes. Enrolled in cardiac rehab.  07/15/20 Dr Halford Chessman (pulmonary): f/u for OSA, COPD. Compliant with CPAP, stop Spiriva d/t cost and not helping.  07/03/20 Dr Debara Pickett (cardiolgy): f/u lipid clinic. LDL improved 207 > 140 on Repatha, then to 96 with ezetimibe. Will work on lifestyle modification, if no improvement will try bempedoic acid.  05/15/20 - 05/17/20 Hospitalization for unstable angina, DES x 2. Plan DAPT (clopidogrel + aspirin) x 1 month then clopidogrel for 1 year (also on Eliquis)  Hospital visits: Medication Reconciliation was completed by comparing discharge summary, patient's EMR and Pharmacy list, and upon discussion with patient.  Admitted to the hospital on 01/30/21 due to acute respiratory failure due to CHF. Discharge date was 02/05/21. Discharged from [facility]?Medications Started at [facility] Discharge:?? -started sildenafil 20 mg TID due to [facility] -started supplemental O2 via [place]  Medication Changes at Hospital Discharge: -Changed Humalog 75/25 to 18 units BID  Medications Discontinued at Hospital Discharge: -Repatha  and Jardiance (pt not taking due to cost)   Medications that remain the same after Hospital Discharge:??  -All other medications will remain the same.     Objective:  Lab Results  Component Value Date   CREATININE 0.84 02/05/2021   BUN 16 02/05/2021   GFR 53.03 (L) 02/20/2020   GFRNONAA >60 02/05/2021   GFRAA 46 (L) 06/09/2020   NA 139 02/05/2021   K 3.7 02/05/2021   CALCIUM 9.7 02/05/2021   CO2 37 (H) 02/05/2021    Lab Results  Component Value Date/Time   HGBA1C 8.0 (A) 03/24/2021 10:23 AM   HGBA1C 9.5 (A) 12/02/2020 10:20 AM   HGBA1C 8.3 (H) 05/15/2020 05:22 PM   HGBA1C 9.2 (H) 02/20/2020 12:39 PM   GFR 53.03 (L) 02/20/2020 12:39 PM   GFR 52.65 (L) 01/31/2019 10:33 AM   MICROALBUR 11.9 (H) 07/31/2018 10:22 AM   MICROALBUR 1.6 09/08/2016 11:59 AM    Last diabetic Eye exam:  Lab Results  Component Value Date/Time   HMDIABEYEEXA No Retinopathy 06/05/2018 12:00 AM    Last diabetic Foot exam: No results found for: HMDIABFOOTEX   Lab Results  Component Value Date   CHOL 164 05/16/2020   HDL 50 05/16/2020   LDLCALC 96 05/16/2020   LDLDIRECT 144.6 02/03/2010   TRIG 90 05/16/2020   CHOLHDL 3.3 05/16/2020    Hepatic Function Latest Ref Rng & Units 02/05/2021 02/02/2021 02/01/2021  Total Protein 6.5 - 8.1 g/dL 6.4(L) 6.0(L) 6.4(L)  Albumin 3.5 - 5.0 g/dL 3.0(L) 2.8(L) 3.0(L)  AST 15 - 41 U/L _0 ALT 0 - 44 U/L _1 Alk Phosphatase 38 - 126 U/L 78 72 76  Total Bilirubin 0.3 - 1.2 mg/dL 0.9 1.3(H) 1.1  Bilirubin, Direct 0.0 - 0.2 mg/dL - - -    Lab Results  Component Value Date/Time   TSH 0.866 05/15/2020 05:22 PM   TSH 1.93 02/26/2020 10:53 AM   TSH 1.50 08/22/2019 02:12 PM   FREET4 0.87 08/22/2019 02:12 PM   FREET4 0.94 02/22/2019 03:18 PM    CBC Latest Ref Rng & Units 02/05/2021 02/04/2021 02/04/2021  WBC 4.0 - 10.5 K/uL 5.1 - -  Hemoglobin 12.0 - 15.0 g/dL 14.7 17.0(H) 16.0(H)  Hematocrit 36.0 - 46.0 % 46.5(H) 50.0(H) 47.0(H)  Platelets 150 - 400 K/uL 156 - -    Lab Results  Component Value Date/Time   VD25OH 63 06/15/2010 08:19 PM    Clinical ASCVD: Yes  The ASCVD Risk score Mikey Bussing DC Jr., et  al., 2013) failed to calculate for the following reasons:   The 2013 ASCVD risk score is only valid for ages 73 to 45   The patient has a prior MI or stroke diagnosis    Depression screen [facility] 2/9 02/17/2021 09/19/2020 07/17/2020  Decreased Interest 0 0 0  Down, Depressed, Hopeless 1 0 0  PHQ - 2 Score 1 0 0  Some recent data might be hidden     CHA2DS2-VASc Score = 7  The patient's score is based upon: CHF History: Yes HTN History: Yes Diabetes History: Yes Stroke History: No Vascular Disease History: Yes     Social History   Tobacco Use  Smoking Status Former Smoker  . Packs/day: 0.25  . Years: 18.00  . Pack years: 4.50  . Types: Cigarettes  . Quit date: 12/27/1968  . Years since quitting: 52.4  Smokeless Tobacco Never Used  Tobacco Comment   smoked [place], up to 1 pp week  BP Readings from Last 3 Encounters:  05/11/21 (!) 162/80  03/24/21 (!) 116/58  03/24/21 (!) 160/60   Pulse Readings from Last 3 Encounters:  05/11/21 79  03/24/21 84  03/24/21 95   Wt Readings from Last 3 Encounters:  05/11/21 168 lb 3.2 oz (76.3 kg)  03/24/21 166 lb 6.4 oz (75.5 kg)  03/24/21 165 lb 6.4 oz (75 kg)    Assessment/Interventions: Review of patient past medical history, allergies, medications, health status, including review of consultants reports, laboratory and other test data, was performed as part of comprehensive evaluation and provision of chronic care management services.   SDOH:  (Social Determinants of Health) assessments and interventions performed: Yes   CCM Care Plan  Allergies  Allergen Reactions  . Levaquin [Levofloxacin] Shortness Of Breath and Swelling    angioedema  . Lobster [Shellfish Allergy] Other (See Comments)    angioedema  . Penicillins Rash    Has patient had a PCN reaction causing immediate rash, facial/tongue/throat swelling, SOB or lightheadedness with hypotension: Yes Has patient had a PCN reaction causing severe rash involving mucus  membranes or skin necrosis: No Has patient had a PCN reaction that required hospitalization: No Has patient had a PCN reaction occurring within the last 10 years: No If all of the above answers are "NO", then may proceed with Cephalosporin use.  . Valsartan Other (See Comments)    REACTION: angioedema  . Codeine Other (See Comments)    Mental status changes  . Lipitor [Atorvastatin] Other (See Comments)    weakness  . Oxycodone Other (See Comments)    hallucinations  . Statins     Made her too weak  . Tramadol   . Amlodipine Besylate Other (See Comments)    REACTION: tingling in lips & gum edema 7/12: talked to patient, states she is tolerating well  . Crestor [Rosuvastatin Calcium] Rash  . Tramadol Hcl Nausea And Vomiting    Medications Reviewed Today    Reviewed by Melvenia Needles, NP (Nurse Practitioner) on 05/11/21 at [place] List Status: <None>  Medication Order Taking? Sig Documenting Provider Last Dose Status Informant  acetaminophen (TYLENOL) 325 MG tablet [id number] Yes Take 2 tablets (650 mg total) by mouth every 6 (six) hours as needed for mild pain or headache. Sheikh, Omair [place], DO Taking Active   amLODipine (NORVASC) 2.5 MG tablet [id number] Yes Take 1 tablet (2.5 mg total) by mouth daily. Darrick Grinder D, NP Taking Active   apixaban (ELIQUIS) 2.5 MG TABS tablet [id number] Yes  [provider] Taking Active   carvedilol (COREG) 6.25 MG tablet [id number] Yes TAKE 1 TABLET BY MOUTH  TWICE DAILY WITH MEALS Bensimhon, Shaune Pascal, MD Taking Active   clopidogrel (PLAVIX) 75 MG tablet [id number] Yes TAKE 1 TABLET BY MOUTH ONCE DAILY WITH BREAKFAST Hilty, Nadean Corwin, MD Taking Active   ELIQUIS 5 MG TABS tablet [id number] Yes TAKE 1 TABLET BY MOUTH  TWICE DAILY Bensimhon, Shaune Pascal, MD Taking Active   furosemide (LASIX) 20 MG tablet [id number]  1 tablet [provider]  Active   glucose blood (ONETOUCH VERIO) test strip [id number] Yes 1 each by Other route 2 (two) times  daily. And lancets 2/day Renato Shin, MD Taking Active   Insulin Lispro Prot & Lispro (HUMALOG MIX 75/25 KWIKPEN) (75-25) 100 UNIT/ML Claiborne Rigg [id number] No 28 units with breakfast and 8 units with dinner.  Patient not taking: Reported on 05/11/2021   Renato Shin, MD Not Taking Active   insulin NPH  Human (NOVOLIN N) 100 UNIT/ML injection [id number] Yes  [provider] Taking Active   Lancets [facility] DELICA PLUS [id number]) [place] [id number] Yes USE TO MONITOR GLUCOSE  LEVELS TWICE DAILY Renato Shin, MD Taking Active Self  Multiple Vitamins-Minerals (CENTRUM SILVER ULTRA WOMENS PO) [id number] Yes Take 1 tablet by mouth daily. [provider] Taking Active   nitroGLYCERIN (NITROSTAT) 0.4 MG SL tablet [id number] Yes Place 1 tablet (0.4 mg total) under the tongue every 5 (five) minutes as needed for chest pain. Reported on 01/28/2016 Bensimhon, Shaune Pascal, MD Taking Active Self           Med Note Knox Royalty   Tue Mar 10, 2021 10:37 AM) 03/10/21: Reports has not needed recently  NOVOFINE PLUS PEN NEEDLE 32G X 4 MM MISC [id number] Yes SMARTSIG:1 Each SUB-Q Daily Renato Shin, MD Taking Active   OXYGEN [id number] Yes Inhale 3 % into the lungs continuous. [provider] Taking Active   potassium chloride SA (KLOR-CON) 20 MEQ tablet [id number] Yes TAKE 2 TABLETS BY MOUTH  DAILY Bensimhon, Shaune Pascal, MD Taking Active   torsemide (DEMADEX) 20 MG tablet [id number] Yes Take 2 tablets (40 mg total) by mouth 2 (two) times daily. Bensimhon, Shaune Pascal, MD Taking Active   Turmeric 500 MG CAPS [id number] Yes  [provider] Taking Active           Patient Active Problem List   Diagnosis Date Noted  . Chronic respiratory failure with hypoxia ([place]) 05/12/2021  . Acute respiratory failure with hypoxia and hypercapnia ([place]) 01/31/2021  . Elevated troponin 01/31/2021  . Type 2 diabetes mellitus with hyperlipidemia ([place]) 01/31/2021  . Neuropathy 08/19/2020  . PAD (peripheral artery  disease) ([place]) 02/29/2020  . Chronic kidney disease (CKD) 02/20/2020  . Osteoarthritis 08/01/2019  . Arthritis of left sacroiliac joint 05/04/2017  . Osteopenia 02/17/2017  . Piriformis syndrome of left side 02/16/2017  . Acquired leg length discrepancy 02/16/2017  . COPD GOLD I  01/27/2017  . Lumbar radiculopathy, acute 01/27/2017  . Granuloma annulare 07/27/2016  . Numbness of fingers 07/27/2016  . Primary osteoarthritis of right knee 06/27/2016  . Rash and nonspecific skin eruption 06/09/2016  . Diabetes ([place]) 03/07/2016  . Acute on chronic combined systolic (congestive) and diastolic (congestive) heart failure ([place])   . OSA  08/28/2014  . Chronic diastolic heart failure ([place]) 07/30/2014  . Nocturnal hypoxemia 07/09/2014  . SOB (shortness of breath) 05/15/2014  . Pulmonary HTN ([place]) 05/15/2014  . Band keratopathy 01/16/2014  . 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 Hyperthyroidism 11/04/2011  . Multinodular goiter 10/14/2011  . Atrophy, Fuchs' 09/29/2011  . ATRIAL FIBRILLATION 06/03/2010  . VITILIGO 11/07/2009  . SINOATRIAL NODE DYSFUNCTION 06/16/2009  . Atrial flutter ([place]) 06/25/2008  . HYPERLIPIDEMIA 01/29/2008  . Coronary atherosclerosis 01/29/2008  . HTN (hypertension) 10/30/2007    Immunization History  Administered Date(s) Administered  . Fluad Quad(high Dose 65+) 08/22/2019  . Influenza Split 11/16/2011, 09/26/2012  . Influenza Whole 12/27/2004, 10/21/2008, 10/20/2009, 11/11/2010  . Influenza, High Dose Seasonal PF 10/14/2016, 10/10/2017, 09/28/2018  . Influenza,inj,Quad PF,6+ Mos 09/24/2013, 12/16/2014, 09/19/2015  . Moderna SARS-COV2 Booster Vaccination 11/03/2020  . Moderna Sars-Covid-2 Vaccination 02/08/2020, 03/10/2020  . PPD Test 05/31/2015, 07/07/2016  . Pneumococcal Conjugate-13 07/31/2018  . Pneumococcal Polysaccharide-23 12/16/2014  . Pneumococcal-Unspecified 05/31/2015  . Td 06/15/2010    Conditions to be addressed/monitored:   Hypertension, Hyperlipidemia, Diabetes, Atrial Fibrillation, Heart Failure, Coronary Artery Disease and Osteopenia  Patient Care Plan: [place]  Plan    Problem Identified: Hypertension, Hyperlipidemia, Diabetes, Atrial Fibrillation, Heart Failure, Coronary Artery Disease and Osteopenia   Priority: High    Long-Range Goal: Disease management   Start Date: 02/16/2021  Expected End Date: 08/16/2021  This Visit's Progress: On track  Priority: High  Note:   Current Barriers:  . Unable to independently afford treatment regimen . Unable to independently monitor therapeutic efficacy . Unable to maintain control of diabetes  Pharmacist Clinical Goal(s):  Marland Kitchen Over the next 90 days, patient will verbalize ability to afford treatment regimen . achieve adherence to monitoring guidelines and medication adherence to achieve therapeutic efficacy . maintain control of diabetes as evidenced by improved A1c  through collaboration with PharmD and provider.   Interventions: . 1:1 collaboration with [name], MD regarding development and update of comprehensive plan of care as evidenced by provider attestation and co-signature . Inter-disciplinary care team collaboration (see longitudinal plan of care) . Comprehensive medication review performed; medication list updated in electronic medical record  Hypertension / Heart Failure / PAH (BP goal < 140/90) Controlled - symptoms improving since hospitalization, however pt is reporting swelling in feet that she thinks is due to sildenafil. Current regimen:  ? Amlodipine 10 mg daily ? Carvedilol 6.25 mg twice a day ? Furosemide 80 mg twice a day ? Potassium 20 mEq twice a day ? Sildenafil 20 mg (Revatio) 3 times daily (PAH) Interventions: ? Discussed BP goals and benefits of medication for prevention of heart attack / stroke ? Discussed weighing daily and when to increase furosemide if weight gain of 3+ lbs overnight ? Educated on sildenafil  side effects. Advised to discuss with PCP and cardiologist at appts this week Patient self care activities ? Check BP 3 times a week, document, and provide at future appointments ? Ensure daily salt intake < 2300 mg/day ? Follow up with PCP and cardiologist   Hyperlipidemia / Coronary artery disease (LDL goal < 70) HX CAD (last 05/16/20 PCI w/ DES)  HX PAD s/p stenting Not ideally controlled - last LDL May 2021, has initiated Repatha since then. Pt has not been taking Repatha for ~2 months due to cost - for this reason it was removed from list during hospitalization earlier this month. Clinically there is no reason she cannot take Repatha, and pt would benefit from continuation of the medication. She was using [employer] to pay for it last year but it ran out. Current regimen:  ? Clopidogrel 75 mg daily (plan to stop May 2022) ? Repatha 140 mg every 14 days (not taking right now d/t cost) ? Ezetimibe 10 mg daily ? Nitroglycerin 0.4 mg as needed Interventions: ? Discussed cholesterol goals and benefits of medication for prevention of heart attack / stroke ? Re-enrolled patient in [employer] for [place] - she will receive medication via delivery from [place] Patient self care activities ? Continue medications as prescribed   Diabetes (A1c goal < 8%) Uncontrolled, but improving now. Jardiance was stopped in the hospital and insulin dose was changed to BID. Home BG readings: <130 AM, < 200 throughout the day Current regimen:  ? Humalog 75/25 18 units AM and PM Interventions: ? Discussed A1c goals and benefits of medication for prevention of diabetic complications ? Counseled on dosing change in the hospital, patient-reported blood sugars have vastly improved. Patient self care activities ? Check blood sugar in the morning before eating or drinking, document, and provide at future appointments ? Contact provider with any episodes  of hypoglycemia   Atrial  Fibrillation Controlled Current regimen:  ? Carvedilol 6.25 mg twice a day ? Eliquis 5 mg twice a day Interventions: ? Discussed bleeding risk associated with Eliquis (especially with addition of clopidogrel)  ? May consider Eliquis Patient assistance once she reaches donut hole Patient self care activities ? Monitor for signs and symptoms of bleeding ? Avoid NSAIDs - ibuprofen, naproxen ? Contact pharmacist when donut hole is reached   Osteopenia Current regimen:  ? Vitamin D 1000 IU daily Interventions: ? Recommend calcium 500-600 mg daily in addition to dietary calcium Patient self care activities  ? Order Citracal through [facility] order as part of OTC benefit  Patient Goals/Self-Care Activities . Over the next 90 days, patient will:  - take medications as prescribed focus on medication adherence by pill box check glucose twice a day, document, and provide at future appointments weigh daily, and contact provider if weight gain of 3+ lbs overnight collaborate with provider on medication access solutions  Follow Up Plan: Telephone follow up appointment with care management team member scheduled for: 3 months   Patient Care Plan: Heart Failure (Adult)    Problem Identified: Symptom Exacerbation (Heart Failure)   Priority: High    Long-Range Goal: Symptom Exacerbation Prevented or Minimized   Start Date: 02/17/2021  Expected End Date: 12/25/2021  This Visit's Progress: On track  Recent Progress: On track  Priority: Medium  Note:   Current Barriers:  Marland Kitchen Knowledge deficits related to use of new home Oxygen and need for ongoing reinforcement of basic heart failure pathophysiology and self care management . Unable to independently verbalize long-term plan for use of home O2, newly prescribed Nurse Case Manager Clinical Goal(s):  Over the next 90 days, patient will verbalize understanding of Heart Failure Action Plan and when to call doctor: goal met 04/14/21 04/14/21: Goal  re-established/ modified/ extended Over the next 8 months, patient will: . Continue to monitor and record daily weight at home . Continue to use home O2 with activity and CPAP at night . Continue to follow action plan for development of signs/ symptoms CHF exacerbation/ yellow CHF zone Interventions:  . Collaboration with [name], MD regarding development and update of comprehensive plan of care as evidenced by provider attestation and co-signature . Inter-disciplinary care team collaboration (see longitudinal plan of care) . Chart reviewed including relevant office notes, upcoming scheduled appointments, and lab results . Discussed current  clinical condition with patient and confirmed no current clinical or medication concerns; confirmed patient continues to manage medications independently at home and reports adherence to medication regimen: confirmed patient has not needed to use NTG or "extra" diuretic since our last outreach conversation . Reviewed recent CHF clinic office visit 03/19/21 and pulmonary office visit 03/24/21 with patient: confirmed she is able to accurately verbalize changes made to blood pressure medication and that she is taking as prescribed . Reviewed recent weights at home with patient: patient reports general weight ranges at home consistently at "161 lbs" since our last outreach conversation . Confirmed patient remains active with PT/OT at [facility] and is also walking in between PT sessions now that weather is nice . Confirmed patient continues using home O2 and has been able to wean home O2 at times: not having to use when she is at rest; using now only with activity and HS; continues to use home CPAP qHS; continues using regularly at home between 2-3 L/min; continues monitoring/ recording blood pressures ("all very good and  in range") during PT sessions and at home QD; continues monitoring/ recording SaO2 levels ("all in the 90's") QD and prn; continues wearing compression  stockings QD . Confirmed patient able to verbalize with minimal prompting previously provided education around signs/ symptoms yellow CHF zone/ weight gain guidelines in setting of CHF along with corresponding action plan . Confirmed patient continues to endorse following low salt, heart healthy diet: discussed strategies that patient has recently taken to continue following low salt- diet; she reports her ILF is now not adding salt to prepared food . Reviewed recent home visit with [facility] team: confirmed patient happy with palliative care involvement: encouraged her ongoing engagement with Palliative Care team- scheduled for next week per patient report . Reviewed upcoming provider appointments with patient and confirmed that patient has plans to attend all as scheduled: May 16: pulmonary follow up; May 17: CCM Pharmacist phone call; June 2: endocrinology appointment; June 23: CHF follow up appointment.  Confirmed no transportation issues: [facility] provides transportation and patient's daughter's also assists with transportation . Discussed plans with patient for ongoing care management follow up and confirmed patient has direct contact information for care management team Patient Goals/Self-Care Activities . Continue using home oxygen as prescribed; It is wonderful to hear that you have been able to decrease your use of home O2-- keep up the great work! Doristine Devoid job re-filling your prescription for NTG-- I am glad that you have not needed to use the NTG recently; remember- if you ever have to use this medication, you should do so while seated or lying down: this medication can lower your blood pressure significantly . Continue to monitor your oxygen levels regularly at home: with activity and at rest, and if you ever start to feel unusually short of breath . Continue to weigh daily: you reported weights at home today consistently at 161 lbs; You are right on track with  maintaining your goal weight- keep up the great work tracking weight in your health journal . Call office if I gain more than 2 pounds in one day or 5 pounds in one week . Continue using salt in moderation and keeping up with the amount of fluid you take in every day . Watch for swelling in feet, ankles and legs every day: continue to wear the compression stockings- I am glad they are helping with the swelling you had been experiencing . I am glad that you enjoyed your visit with the [facility] Team and that they are involved in your care and making regular home visits with you Follow Up Plan:  . Telephone follow up appointment with care management team member scheduled for: June 22, 2021 at 10:00 am . The patient has been provided with contact information for the care management team and has been advised to call with any health related questions or concerns.     Patient Care Plan: Diabetes Type 2 (Adult)    Problem Identified: Glycemic Management (Diabetes, Type 2)   Priority: High    Long-Range Goal: Glycemic Management Optimized   Start Date: 02/17/2021  Expected End Date: 12/25/2021  This Visit's Progress: On track  Recent Progress: On track  Priority: Medium  Note:   Objective:  Lab Results  Component Value Date   HGBA1C 9.5 (A) 12/02/2020 .   Lab Results  Component Value Date   CREATININE 0.84 02/05/2021   CREATININE 0.79 02/04/2021   CREATININE 0.84 02/03/2021 .   Marland Kitchen No results found for: EGFR  Current Barriers:  Marland Kitchen Knowledge Deficits related to basic Diabetes pathophysiology and self care/management: patient could benefit from ongoing reinforcement of self-health management strategies for DM . Knowledge Deficits related to medications used for management of diabetes- patient actively engaged with CCM Pharmacist at PCP office . No self-care deficits: patient resides at independent living facility and has support if needed; patient verbalizes accurate general  understanding of plan of care post- recent hospital discharge 02/05/21 Case Manager Clinical Goal(s):  04/14/21: goal re-established/ extended Over the next 8 months, patient will demonstrate ongoing adherence to prescribed treatment plan for diabetes self care/management as evidenced by:  . daily monitoring and recording of CBG twice daily  . adherence to ADA/ carb modified diet  . Light exercise with established PT at [facility] facility 4-5 days/week; walking when weather is nice  . adherence to prescribed medication regimen  . contacting care providers for new or worsened symptoms, concerns or questions Interventions:  . Collaboration with [name], MD regarding development and update of comprehensive plan of care as evidenced by provider attestation and co-signature . Inter-disciplinary care team collaboration (see longitudinal plan of care) . Review of patient status, including review of consultants reports, relevant laboratory and other test results, and medications completed . Reviewed recent office visit with endocrinologist 03/24/21 with patient: confirmed that she is able to verbalize changes made to insulin dosing HS, and confirmed that she is taking insulin as prescribed and has no questions/ concerns.  Discussed her plan to obtain additional fingerstick monitoring needles and confirmed that she has contacted endocrinologist for same- positive reinforcement provided . Discussed patient's most recently obtained A1-C level of 8.0 (03/24/21): discussed that this value represents excellent improvement in previous A1-C of 9.5 in December 2021: discussed general correlation of average blood sugars at home to A1-C levels . Reviewed recent fasting blood sugars with patient: she reports ranges at home consistently between 109-153; post-prandial values between 170-203: positive reinforcement provided for patient continuing to monitor/ record blood sugars.  Confirmed no recent low blood  sugars at home . Discussed patient's recent strategy to limit portion sizes, change eating habits, increase activity to decrease blood sugars at home . Reviewed upcoming follow up endocrinology appointment with patient scheduled for May 28, 2021: confirmed she has plans to attend as scheduled . Discussed plans with patient for ongoing care management follow up and confirmed patient has direct contact information for care management team Patient Goals: . Check blood sugar at prescribed times, or if I feel it is too high or too low.  Keep up entering blood sugar readings and medication or insulin into daily log . Take the blood sugar log to all doctor visits . Continue your excellent efforts to follow low sugar/ carb modified diet-- good job controlling portion size: keep up the great work  . We talked about your recent A1-C level of 8.0 on 03/24/21--  this correlates to an average blood sugar of 180-- this is a great improvement from your last A1-C level of 9.5 (December 2021): keep up the great work! . Your next visit with your endocrinologist is scheduled for May 28, 2021 at 9:45 am . Continue light exercise with PT at [facility] 4-5 days/week: great job staying active with your PT/OT team and starting to walk now that the weather is nice- keep up the great work  . Continue your prescribed medication regimen  . Contacting care providers for new or worsened symptoms, concerns or questions Self-Care Activities .  Self administers oral medications as prescribed . Self administers insulin as prescribed . Attends all scheduled provider appointments . Checks blood sugars as prescribed . Adheres to prescribed ADA/carb modified and is motivated to ongoing learning around same Follow Up Plan:  . Telephone follow up appointment with care management team member scheduled for: 06/22/21 at 10:00 am . The patient has been provided with contact information for the care management team and has  been advised to call with any health related questions or concerns.     Problem Identified: Disease Progression (Diabetes, Type 2)        Medication Assistance: Repatha obtained through [facility] medication assistance program.  Enrollment ends 01/16/2022  Healthwell ID: [id number]  Pharmacy Copay card ID: [id number]  RxBIN: [id number]  RxGRP: [id number]  RxPCN: [id number]  **Repatha will be filled at [facility] for delivery to patient.  Patient's preferred pharmacy is:  [facility], [address], [address] [place], [address] [place] [address] Phone: [phone] Fax: [phone]  [facility] - [place], [place] - [address] [place] [address] Phone: [phone] Fax: [phone]  Uses pill box? Yes Pt endorses 100% compliance  We discussed: Current pharmacy is preferred with insurance plan and patient is satisfied with pharmacy services Patient decided to: Continue current medication management strategy  Care Plan and Follow Up Patient Decision:  Patient agrees to Care Plan and Follow-up.  Plan: Telephone follow up appointment with care management team member scheduled for:  3 months  Charlene Brooke, PharmD, [facility] Clinical Pharmacist [facility] at [facility] [phone]

## 2021-05-12 NOTE — Assessment & Plan Note (Signed)
Continue on O2 to maintain O2 saturations greater than 88 to 90%.  Order for a POC to help with independence and mobility.  Will require 3 L of pulsed oxygen while using POC.  Patient is continue on 2 L of oxygen and 3 L with activities.  Continue on 2 L of oxygen at bedtime with CPAP

## 2021-05-12 NOTE — Assessment & Plan Note (Signed)
Appears euvolemic on exam with no evidence of overt volume overload.  Continue on current regimen

## 2021-05-14 DIAGNOSIS — J9601 Acute respiratory failure with hypoxia: Secondary | ICD-10-CM | POA: Diagnosis not present

## 2021-05-14 DIAGNOSIS — J449 Chronic obstructive pulmonary disease, unspecified: Secondary | ICD-10-CM | POA: Diagnosis not present

## 2021-05-14 DIAGNOSIS — M6281 Muscle weakness (generalized): Secondary | ICD-10-CM | POA: Diagnosis not present

## 2021-05-14 DIAGNOSIS — R262 Difficulty in walking, not elsewhere classified: Secondary | ICD-10-CM | POA: Diagnosis not present

## 2021-05-15 DIAGNOSIS — J9601 Acute respiratory failure with hypoxia: Secondary | ICD-10-CM | POA: Diagnosis not present

## 2021-05-15 DIAGNOSIS — M6281 Muscle weakness (generalized): Secondary | ICD-10-CM | POA: Diagnosis not present

## 2021-05-18 ENCOUNTER — Other Ambulatory Visit: Payer: Medicare Other | Admitting: Nurse Practitioner

## 2021-05-18 ENCOUNTER — Other Ambulatory Visit: Payer: Self-pay

## 2021-05-18 VITALS — BP 164/70 | HR 72 | Resp 18

## 2021-05-18 DIAGNOSIS — J9611 Chronic respiratory failure with hypoxia: Secondary | ICD-10-CM

## 2021-05-18 DIAGNOSIS — Z515 Encounter for palliative care: Secondary | ICD-10-CM | POA: Diagnosis not present

## 2021-05-18 DIAGNOSIS — R600 Localized edema: Secondary | ICD-10-CM

## 2021-05-18 DIAGNOSIS — R030 Elevated blood-pressure reading, without diagnosis of hypertension: Secondary | ICD-10-CM

## 2021-05-18 NOTE — Progress Notes (Signed)
[facility] Palliative Care Consult Note Telephone: [phone]  Fax: [phone]    Date of encounter: 05/18/21 PATIENT NAME: [name] [address] [place] [address]   [phone] (home)  DOB: [birth year] MRN: [id number]  PRIMARY CARE PROVIDER:    [name], MD,  [place] [address] [phone]  REFERRING PROVIDER:   [name], MD [address] [place],  [place] [address] [phone]  RESPONSIBLE PARTY:    Contact Information    Name Relation Home Work Hanna Son [phone]  [phone]   Tashema, Tiller Daughter [phone]  [phone]     I met face to face with patient in home in [facility] independent living facility. Palliative Care was asked to follow this patient by consultation request of  Burns, [name], MD to address advance care planning and complex medical decision making. This is a follow up visit.                                  ASSESSMENT AND PLAN / RECOMMENDATIONS:   Advance Care Planning/Goals of Care:  CODE STATUS: DNR Goal of care: Patient's goal of care is comfort while preserving function. Directives: Signed DNR and MOST form present in home, copy on [facility] EMR. Details of MOST form include; comfort measures, determine use or limitation of antibiotics, IV fluid for a defined trial period, no feeding tube.  Symptom Management/Plan: Bilateral lower extremity edema: Continue current plan of care with Torsemide 54m daily and use of compression stocks. Discussed need to be consistent in the use of the compression socks. Patient advised to elevate her bilateral legs when sitting for prolonged time. Patient verbalized understanding Elevated blood pressure: BP today 164/70, HR 72. Patient's currently on Coreg 6.220mtwice a day and Amlodipine 2.42m17mstarted on 03/30/2021). Patient denied chest pain. Patient encouraged to keep log  of blood pressures twice a day, one before lunch time and bedtime. Patient to call if SBP consistently greater than 150 in a week. Patient tolerating Eliquis and Plavix without report of acute bleeding. Chronic respiratory failure: Condition is stable, oxygen saturation is 92% on room air today. Patient is awaiting portable oxygen concentrator to promote independence. She report using 2-3L oxygen as needed and CPAP at bedtime. Continue current plan of care. Questions and concerns were addressed. Patient encouraged to call with questions and/or concerns. Provided general support and encouragement, no other unmet needs identified.  Follow up Palliative Care Visit: Palliative care will continue to follow for complex medical decision making, advance care planning, and clarification of goals. Return in about 4 weeks or prn.  PPS: 70%  HOSPICE ELIGIBILITY/DIAGNOSIS: TBD  CHIEF COMPLAIN: elevated blood pressure  History obtained from review of Epic EMR, discussion with Ms. GriModisette HISTORY OF PRESENT ILLNESS:  [name] a 89 86o. year old female with multiple medical problems including CAD s/p stents placement, HTN, Afib (on Eliquis), pulmonary hypertension on chronic oxygen use, CHF (EF 50-55% on 05/16/2021), type 2 diabetes (A1c 8), CKD 3, PAD, osteoarthritis, OSA on CPAP.   Patient with report of lower extremity edema in the context of chronic heart failure. Patient report edema worse in the evening, report edema is better on some days. Patient is on Furosemide and uses compression socks, has none on at visit today. Edema is not associated with shortness of  breath, acute cough, or crackles. She denied fever, denied chills, denied increased SOB. Ten systems reviewed and are negative for acute change, except as noted in the HPI.   I reviewed available labs, medications, imaging, studies and related documents from the EMR. Records reviewed and summarized above.   Vitals:   05/18/21 1058   BP: (!) 164/70  Pulse: 72  Resp: 18  SpO2: 92% Comment: room air    Physical Exam: Current and past weights:168lb up from 165lbs, 90f4", BMI 30.76kg/m2 General: well appearing, cooperative, sitting in chair in NAD EYES: anicteric sclera, no discharge  ENMT: intact hearing, oral mucous membranes moist CV: +2 Bilateral pedal edema Pulmonary: no increased work of breathing, no cough, no wheezes, room air Abdomen:no ascites GU: deferred MDYJ:WLKHVall extremities, ambulatory Skin: warm and dry, no rash, no open wound  Neuro: Generalized weakness,A and O x 4 Psych: non-anxious affecttoday Hem/lymph/immuno: no widespread bruising   Component Ref Range & Units 3 mo ago  (02/05/21) 3 mo ago  (02/04/21) 3 mo ago  (02/04/21) 3 mo ago  (02/04/21) 3 mo ago  (02/03/21) 3 mo ago  (02/02/21) 3 mo ago  (02/01/21)  Sodium 135 - 145 mmol/L 139  141  144  140  138  136  140   Potassium 3.5 - 5.1 mmol/L 3.7  3.6  3.3Low  4.0  3.9  4.2  4.1   Chloride 98 - 111 mmol/L 92Low    94Low  94Low  95Low  97Low   CO2 22 - 32 mmol/L 37High    35High  32  32  33High   Glucose, Bld 70 - 99 mg/dL 183High    135High CM  167High CM  138High CM  149High CM   Comment: Glucose reference range applies only to samples taken after fasting for at least 8 hours.  BUN 8 - 23 mg/dL 16    16  20  19  15    Creatinine, Ser 0.44 - 1.00 mg/dL 0.84    0.79  0.84  0.97  0.91   Calcium 8.9 - 10.3 mg/dL 9.7    9.6  9.2  8.9  9.0   Total Protein 6.5 - 8.1 g/dL 6.4Low      6.0Low  6.4Low   Albumin 3.5 - 5.0 g/dL 3.0Low      2.8Low  3.0Low   AST 15 - 41 U/L 20      23  23    ALT 0 - 44 U/L 18      15  19    Alkaline Phosphatase 38 - 126 U/L 78      72  76   Total Bilirubin 0.3 - 1.2 mg/dL 0.9      1.3High  1.1   GFR, Estimated >60 mL/min >60    >60 CM  >60 CM  57Low CM  >60 CM   Comment: (NOTE)    Component Ref Range & Units 3 mo ago  (02/05/21) 3 mo ago  (02/04/21) 3 mo ago  (02/04/21) 3 mo  ago  (02/04/21) 3 mo ago  (02/03/21) 3 mo ago  (02/02/21) 3 mo ago  (02/01/21)  WBC 4.0 - 10.5 K/uL 5.1    5.8  4.2  5.2  5.3   RBC 3.87 - 5.11 MIL/uL 4.86    5.20High  5.37High  4.70  4.96   Hemoglobin 12.0 - 15.0 g/dL 14.7  17.0High  16.0High  15.7High  15.7High  14.1  15.1High   HCT 36.0 - 46.0 %  46.5High  50.0High  47.0High  50.9High  51.9High  46.1High  48.7High   MCV 80.0 - 100.0 fL 95.7    97.9  96.6  98.1  98.2   MCH 26.0 - 34.0 pg 30.2    30.2  29.2  30.0  30.4   MCHC 30.0 - 36.0 g/dL 31.6    30.8  30.3  30.6  31.0   RDW 11.5 - 15.5 % 13.6    13.5  13.4  13.7  14.4   Platelets 150 - 400 K/uL 156    160  145Low  141Low  165    Thank you for the opportunity to participate in the care of Ms. Azzaro.  The palliative care team will continue to follow. Please call our office at [phone] if we can be of additional assistance.   Jari Favre, DNP, AGPCNP-BC  COVID-19 PATIENT SCREENING TOOL Asked and negative response unless otherwise noted:   Have you had symptoms of covid, tested positive or been in contact with someone with symptoms/positive test in the past 5-10 days?

## 2021-05-19 DIAGNOSIS — M6281 Muscle weakness (generalized): Secondary | ICD-10-CM | POA: Diagnosis not present

## 2021-05-19 DIAGNOSIS — J449 Chronic obstructive pulmonary disease, unspecified: Secondary | ICD-10-CM | POA: Diagnosis not present

## 2021-05-19 DIAGNOSIS — R262 Difficulty in walking, not elsewhere classified: Secondary | ICD-10-CM | POA: Diagnosis not present

## 2021-05-20 DIAGNOSIS — J9601 Acute respiratory failure with hypoxia: Secondary | ICD-10-CM | POA: Diagnosis not present

## 2021-05-20 DIAGNOSIS — M6281 Muscle weakness (generalized): Secondary | ICD-10-CM | POA: Diagnosis not present

## 2021-05-21 DIAGNOSIS — M6281 Muscle weakness (generalized): Secondary | ICD-10-CM | POA: Diagnosis not present

## 2021-05-21 DIAGNOSIS — J9601 Acute respiratory failure with hypoxia: Secondary | ICD-10-CM | POA: Diagnosis not present

## 2021-05-21 DIAGNOSIS — G4733 Obstructive sleep apnea (adult) (pediatric): Secondary | ICD-10-CM | POA: Diagnosis not present

## 2021-05-22 DIAGNOSIS — J9601 Acute respiratory failure with hypoxia: Secondary | ICD-10-CM | POA: Diagnosis not present

## 2021-05-22 DIAGNOSIS — M6281 Muscle weakness (generalized): Secondary | ICD-10-CM | POA: Diagnosis not present

## 2021-05-27 DIAGNOSIS — M6281 Muscle weakness (generalized): Secondary | ICD-10-CM | POA: Diagnosis not present

## 2021-05-27 DIAGNOSIS — J9601 Acute respiratory failure with hypoxia: Secondary | ICD-10-CM | POA: Diagnosis not present

## 2021-05-27 DIAGNOSIS — R262 Difficulty in walking, not elsewhere classified: Secondary | ICD-10-CM | POA: Diagnosis not present

## 2021-05-27 DIAGNOSIS — J449 Chronic obstructive pulmonary disease, unspecified: Secondary | ICD-10-CM | POA: Diagnosis not present

## 2021-05-28 ENCOUNTER — Ambulatory Visit: Payer: Medicare Other | Admitting: Endocrinology

## 2021-05-28 ENCOUNTER — Other Ambulatory Visit: Payer: Self-pay

## 2021-05-28 VITALS — BP 200/82 | HR 90 | Ht 62.0 in | Wt 171.0 lb

## 2021-05-28 DIAGNOSIS — E1122 Type 2 diabetes mellitus with diabetic chronic kidney disease: Secondary | ICD-10-CM

## 2021-05-28 DIAGNOSIS — Z794 Long term (current) use of insulin: Secondary | ICD-10-CM | POA: Diagnosis not present

## 2021-05-28 DIAGNOSIS — N1831 Chronic kidney disease, stage 3a: Secondary | ICD-10-CM

## 2021-05-28 LAB — POCT GLYCOSYLATED HEMOGLOBIN (HGB A1C): Hemoglobin A1C: 8.3 % — AB (ref 4.0–5.6)

## 2021-05-28 MED ORDER — INSULIN LISPRO PROT & LISPRO (75-25 MIX) 100 UNIT/ML KWIKPEN: PEN_INJECTOR | SUBCUTANEOUS | 11 refills | Status: DC

## 2021-05-28 NOTE — Progress Notes (Signed)
Subjective:    Patient ID: [name], female    DOB: [birth year], 87 y.o.   MRN: [id number]  HPI Pt returns for f/u of diabetes mellitus:  DM type: Insulin-requiring type 2.   Dx'ed: 4709 Complications: CAD, PN, PAD, and stage 3 CRI.  Therapy: insulin since 2007, and Jard[facility].    GDM: never.  DKA: never. Severe hypoglycemia: never.   Pancreatitis: never.   SDOH: none. Other: she changed to qd insulin, after poor results with multiple daily injections; she was changed from lantus to QAM NPH, then to 70/30, due to am hypoglycemia and PM hyperglycemia.   Interval history: she brings a record of her cbg's which I have reviewed today.  cbg varies from 64-200.  It is in general higher as the day goes on.  Pt says she never misses the insulin.   Past Medical History:  Diagnosis Date  . Anemia    iron defficiency  . Anginal pain ([place])   . Arthritis    "knees, feet, hands; joints" (05/27/2015)  . Asthma   . Atrial fibrillation or flutter    s/p RFCA 7/08;   s/p DCCV in past;   previously on amiodarone;  amio stopped due to lung toxicity  . CAD (coronary artery disease)    s/p NSTEMI tx with BMS to OM1 3/08;  cath 3/08: pOM 99% tx with PCI, pLAD 20%, ? mod stenosis at the AM  . CAP (community acquired pneumonia) 05/24/2015  . CHF (congestive heart failure) ([place])   . Chronic diastolic heart failure (HCC)    echo 11/11:  EF 55-60%, severe LVH, mod LAE, mild MR, mildly increased PASP  . COPD (chronic obstructive pulmonary disease) ([place])   . Degenerative joint disease   . Dystrophy, corneal stromal   . Heart murmur   . HLD (hyperlipidemia)   . HTN (hypertension)    essential nos  . Hypopotassemia    PMH of  . Muscle pain   . Myocardial infarction ([facility]) 2008  . OSA on CPAP   . Osteoporosis   . Pneumonia 05/27/2015  . Protein calorie malnutrition ([facility])   . Rash and nonspecific skin eruption    both arms,awaiting bio   dr Delman Cheadle  . Seasonal allergies   . Shortness of breath  dyspnea   . Type II diabetes mellitus ([place])    Dr Loanne Drilling    Past Surgical History:  Procedure Laterality Date  . A FLUTTER ABLATION     Dr Lovena Le  . ABDOMINAL AORTOGRAM W/LOWER EXTREMITY Bilateral 04/11/2020   Procedure: ABDOMINAL AORTOGRAM W/LOWER EXTREMITY;  Surgeon: Angelia Mould, MD;  Location: [facility];  Service: Cardiovascular;  Laterality: Bilateral;  . CATARACT EXTRACTION W/ INTRAOCULAR LENS  IMPLANT, BILATERAL Bilateral   . CHOLECYSTECTOMY    . COLONOSCOPY  6/07   2 polyps Dr. Kinnie Feil in [place]  . colonoscopy with polypectomy      X 2; [facility]  . CORNEAL TRANSPLANT Bilateral   . CORONARY ANGIOPLASTY WITH STENT PLACEMENT  02/2007   BMS w/Dr Lovena Le  . CORONARY ATHERECTOMY N/A 05/16/2020   Procedure: CORONARY ATHERECTOMY;  Surgeon: Martinique, Peter M, MD;  Location: [facility];  Service: Cardiovascular;  Laterality: N/A;  . CORONARY BALLOON ANGIOPLASTY N/A 05/16/2020   Procedure: CORONARY BALLOON ANGIOPLASTY;  Surgeon: Martinique, Peter M, MD;  Location: [facility];  Service: Cardiovascular;  Laterality: N/A;  . CORONARY STENT INTERVENTION N/A 05/16/2020   Procedure: CORONARY STENT INTERVENTION;  Surgeon:  Martinique, Peter M, MD;  Location: [facility];  Service: Cardiovascular;  Laterality: N/A;  . EYE SURGERY    . gravid 2 para 2    . INTRAVASCULAR ULTRASOUND/IVUS N/A 05/16/2020   Procedure: Intravascular Ultrasound/IVUS;  Surgeon: Martinique, Peter M, MD;  Location: [facility];  Service: Cardiovascular;  Laterality: N/A;  . KNEE CARTILAGE SURGERY Right   . LEFT AND RIGHT HEART CATHETERIZATION WITH CORONARY ANGIOGRAM N/A 05/07/2014   Procedure: LEFT AND RIGHT HEART CATHETERIZATION WITH CORONARY ANGIOGRAM;  Surgeon: Peter M Martinique, MD;  Location: [facility];  Service: Cardiovascular;  Laterality: N/A;  . PERIPHERAL VASCULAR INTERVENTION  04/11/2020   Procedure: PERIPHERAL VASCULAR INTERVENTION;  Surgeon: Angelia Mould, MD;  Location: [facility];  Service: Cardiovascular;;  . RIGHT HEART CATH N/A 02/04/2021   Procedure: RIGHT HEART CATH;  Surgeon: Jolaine Artist, MD;  Location: [facility];  Service: Cardiovascular;  Laterality: N/A;  . RIGHT/LEFT HEART CATH AND CORONARY ANGIOGRAPHY N/A 05/16/2020   Procedure: RIGHT/LEFT HEART CATH AND CORONARY ANGIOGRAPHY;  Surgeon: Jolaine Artist, MD;  Location: [facility];  Service: Cardiovascular;  Laterality: N/A;  . TOTAL KNEE ARTHROPLASTY Right 06/28/2016   Procedure: TOTAL KNEE ARTHROPLASTY;  Surgeon: Frederik Pear, MD;  Location: [facility];  Service: Orthopedics;  Laterality: Right;    Social History   Socioeconomic History  . Marital status: Widowed    Spouse name: Not on file  . Number of children: 2  . Years of education: 16  . Highest education level: Bachelor's degree (e.g., BA, AB, BS)  Occupational History  . Occupation: Recruitment consultant Retired  Tobacco Use  . Smoking status: Former Smoker    Packs/day: 0.25    Years: 18.00    Pack years: 4.50    Types: Cigarettes    Quit date: 12/27/1968    Years since quitting: 52.4  . Smokeless tobacco: Never Used  . Tobacco comment: smoked [place], up to 1 pp week  Vaping Use  . Vaping Use: Never used  Substance and Sexual Activity  . Alcohol use: Yes    Comment: occ  . Drug use: No  . Sexual activity: Never  Other Topics Concern  . Not on file  Social History Narrative   Teacher. Widowed. Rarely drinks cafeine.    Social Determinants of Health   Financial Resource Strain: Medium Risk  . Difficulty of Paying Living Expenses: Somewhat hard  Food Insecurity: No Food Insecurity  . Worried About Charity fundraiser in the Last Year: Never true  . Ran Out of Food in the Last Year: Never true  Transportation Needs: No Transportation Needs  . Lack of Transportation (Medical): No  . Lack of Transportation (Non-Medical): No  Physical Activity: Not on file  Stress: Not on file  Social Connections: Not on  file  Intimate Partner Violence: Not on file    Current Outpatient Medications on File Prior to Visit  Medication Sig Dispense Refill  . acetaminophen (TYLENOL) 325 MG tablet Take 2 tablets (650 mg total) by mouth every 6 (six) hours as needed for mild pain or headache. 20 tablet 0  . amLODipine (NORVASC) 2.5 MG tablet Take 1 tablet (2.5 mg total) by mouth daily. 90 tablet 3  . apixaban (ELIQUIS) 2.5 MG TABS tablet     . carvedilol (COREG) 6.25 MG tablet TAKE 1 TABLET BY MOUTH  TWICE DAILY WITH MEALS 180 tablet 3  . clopidogrel (PLAVIX) 75 MG  tablet TAKE 1 TABLET BY MOUTH ONCE DAILY WITH BREAKFAST 90 tablet 3  . ELIQUIS 5 MG TABS tablet TAKE 1 TABLET BY MOUTH  TWICE DAILY 60 tablet 11  . furosemide (LASIX) 20 MG tablet 1 tablet    . glucose blood (ONETOUCH VERIO) test strip 1 each by Other route 2 (two) times daily. And lancets 2/day 200 strip 3  . Lancets (ONETOUCH DELICA PLUS [id number]) MISC USE TO MONITOR GLUCOSE  LEVELS TWICE DAILY 200 each 3  . Multiple Vitamins-Minerals (CENTRUM SILVER ULTRA WOMENS PO) Take 1 tablet by mouth daily.    . nitroGLYCERIN (NITROSTAT) 0.4 MG SL tablet Place 1 tablet (0.4 mg total) under the tongue every 5 (five) minutes as needed for chest pain. Reported on 01/28/2016 30 tablet 3  . NOVOFINE PLUS PEN NEEDLE 32G X 4 MM MISC SMARTSIG:1 Each SUB-Q Daily 200 each 1  . OXYGEN Inhale 3 % into the lungs continuous.    . potassium chloride SA (KLOR-CON) 20 MEQ tablet TAKE 2 TABLETS BY MOUTH  DAILY 180 tablet 3  . torsemide (DEMADEX) 20 MG tablet Take 2 tablets (40 mg total) by mouth 2 (two) times daily. 120 tablet 3  . Turmeric 500 MG CAPS      No current facility-administered medications on file prior to visit.    Allergies  Allergen Reactions  . Levaquin [Levofloxacin] Shortness Of Breath and Swelling    angioedema  . Lobster [Shellfish Allergy] Other (See Comments)    angioedema  . Penicillins Rash    Has patient had a PCN reaction causing immediate rash,  facial/tongue/throat swelling, SOB or lightheadedness with hypotension: Yes Has patient had a PCN reaction causing severe rash involving mucus membranes or skin necrosis: No Has patient had a PCN reaction that required hospitalization: No Has patient had a PCN reaction occurring within the last 10 years: No If all of the above answers are "NO", then may proceed with Cephalosporin use.  . Valsartan Other (See Comments)    REACTION: angioedema  . Codeine Other (See Comments)    Mental status changes  . Lipitor [Atorvastatin] Other (See Comments)    weakness  . Oxycodone Other (See Comments)    hallucinations  . Statins     Made her too weak  . Tramadol   . Amlodipine Besylate Other (See Comments)    REACTION: tingling in lips & gum edema 7/12: talked to patient, states she is tolerating well  . Crestor [Rosuvastatin Calcium] Rash  . Tramadol Hcl Nausea And Vomiting    Family History  Problem Relation Age of Onset  . Diabetes Mother   . Hypertension Mother   . Transient ischemic attack Mother   . Arthritis Mother   . Heart attack Father 56  . Arthritis Father   . Hypertension Father   . Breast cancer Maternal Aunt   . Arthritis Maternal Aunt     BP (!) 200/82 (BP Location: Right Arm, Patient Position: Sitting, Cuff Size: Normal)   Pulse 90   Ht 5\' 2"  (1.575 m)   Wt 171 lb (77.6 kg)   SpO2 96%   BMI 31.28 kg/m    Review of Systems     Objective:   Physical Exam VITAL SIGNS:  See vs page GENERAL: no distress  Lab Results  Component Value Date   HGBA1C 8.3 (A) 05/28/2021        Assessment & Plan:  Insulin-requiring type 2 DM: uncontrolled  Patient Instructions  Your blood pressure is high today.  Please see your primary care provider soon, to have it rechecked.   Please change the insulin to 32 units with breakfast, and 6 with supper.   On this type of insulin schedule, you should eat meals on a regular schedule.  If a meal is missed or significantly  delayed, your blood sugar could go low.  check your blood sugar twice a day.  vary the time of day when you check, between before the 3 meals, and at bedtime.  also check if you have symptoms of your blood sugar being too high or too low.  please keep a record of the readings and bring it to your next appointment here (or you can bring the meter itself).  You can write it on any piece of paper.  please call us sooner if your blood sugar goes below 70, or if you have a lot of readings over 200.   Please come back for a follow-up appointment in 3 months.

## 2021-05-28 NOTE — Patient Instructions (Addendum)
Your blood pressure is high today.  Please see your primary care provider soon, to have it rechecked.   Please change the insulin to 32 units with breakfast, and 6 with supper.   On this type of insulin schedule, you should eat meals on a regular schedule.  If a meal is missed or significantly delayed, your blood sugar could go low.  check your blood sugar twice a day.  vary the time of day when you check, between before the 3 meals, and at bedtime.  also check if you have symptoms of your blood sugar being too high or too low.  please keep a record of the readings and bring it to your next appointment here (or you can bring the meter itself).  You can write it on any piece of paper.  please call us sooner if your blood sugar goes below 70, or if you have a lot of readings over 200.   Please come back for a follow-up appointment in 3 months.

## 2021-05-29 DIAGNOSIS — M6281 Muscle weakness (generalized): Secondary | ICD-10-CM | POA: Diagnosis not present

## 2021-05-29 DIAGNOSIS — J9601 Acute respiratory failure with hypoxia: Secondary | ICD-10-CM | POA: Diagnosis not present

## 2021-05-29 DIAGNOSIS — R262 Difficulty in walking, not elsewhere classified: Secondary | ICD-10-CM | POA: Diagnosis not present

## 2021-05-29 DIAGNOSIS — J449 Chronic obstructive pulmonary disease, unspecified: Secondary | ICD-10-CM | POA: Diagnosis not present

## 2021-05-31 ENCOUNTER — Encounter: Payer: Self-pay | Admitting: Internal Medicine

## 2021-05-31 MED ORDER — AZITHROMYCIN 500 MG PO TABS: 500.0000 mg | ORAL_TABLET | Freq: Once | ORAL | 0 refills | Status: DC

## 2021-06-01 ENCOUNTER — Telehealth: Payer: Self-pay | Admitting: Adult Health

## 2021-06-01 DIAGNOSIS — J9601 Acute respiratory failure with hypoxia: Secondary | ICD-10-CM | POA: Diagnosis not present

## 2021-06-01 DIAGNOSIS — M6281 Muscle weakness (generalized): Secondary | ICD-10-CM | POA: Diagnosis not present

## 2021-06-01 NOTE — Telephone Encounter (Signed)
Rx has been transferred to [facility].

## 2021-06-01 NOTE — Telephone Encounter (Signed)
PCC's please advise---I spoke with pt and she stated that she has spoken with [employer] and they gave her the number to fax the POC order to---[phone].  She stated that they told her that they are in network for her insurance.  Please advise. Thanks

## 2021-06-04 NOTE — Telephone Encounter (Signed)
CM sent to [facility] about O2

## 2021-06-05 DIAGNOSIS — J9602 Acute respiratory failure with hypercapnia: Secondary | ICD-10-CM | POA: Diagnosis not present

## 2021-06-05 DIAGNOSIS — M6281 Muscle weakness (generalized): Secondary | ICD-10-CM | POA: Diagnosis not present

## 2021-06-05 DIAGNOSIS — G4733 Obstructive sleep apnea (adult) (pediatric): Secondary | ICD-10-CM | POA: Diagnosis not present

## 2021-06-05 DIAGNOSIS — I5033 Acute on chronic diastolic (congestive) heart failure: Secondary | ICD-10-CM | POA: Diagnosis not present

## 2021-06-05 DIAGNOSIS — J9601 Acute respiratory failure with hypoxia: Secondary | ICD-10-CM | POA: Diagnosis not present

## 2021-06-05 NOTE — Telephone Encounter (Signed)
Order has been sent and confirmation received from [facility]

## 2021-06-06 DIAGNOSIS — G4733 Obstructive sleep apnea (adult) (pediatric): Secondary | ICD-10-CM | POA: Diagnosis not present

## 2021-06-06 DIAGNOSIS — J9602 Acute respiratory failure with hypercapnia: Secondary | ICD-10-CM | POA: Diagnosis not present

## 2021-06-06 DIAGNOSIS — J9601 Acute respiratory failure with hypoxia: Secondary | ICD-10-CM | POA: Diagnosis not present

## 2021-06-06 DIAGNOSIS — I5033 Acute on chronic diastolic (congestive) heart failure: Secondary | ICD-10-CM | POA: Diagnosis not present

## 2021-06-08 DIAGNOSIS — J9601 Acute respiratory failure with hypoxia: Secondary | ICD-10-CM | POA: Diagnosis not present

## 2021-06-08 DIAGNOSIS — M6281 Muscle weakness (generalized): Secondary | ICD-10-CM | POA: Diagnosis not present

## 2021-06-09 DIAGNOSIS — M6281 Muscle weakness (generalized): Secondary | ICD-10-CM | POA: Diagnosis not present

## 2021-06-09 DIAGNOSIS — J9601 Acute respiratory failure with hypoxia: Secondary | ICD-10-CM | POA: Diagnosis not present

## 2021-06-15 DIAGNOSIS — M6281 Muscle weakness (generalized): Secondary | ICD-10-CM | POA: Diagnosis not present

## 2021-06-15 DIAGNOSIS — J9601 Acute respiratory failure with hypoxia: Secondary | ICD-10-CM | POA: Diagnosis not present

## 2021-06-16 DIAGNOSIS — J9601 Acute respiratory failure with hypoxia: Secondary | ICD-10-CM | POA: Diagnosis not present

## 2021-06-16 DIAGNOSIS — M6281 Muscle weakness (generalized): Secondary | ICD-10-CM | POA: Diagnosis not present

## 2021-06-18 ENCOUNTER — Encounter (HOSPITAL_COMMUNITY): Payer: Self-pay | Admitting: Internal Medicine

## 2021-06-18 ENCOUNTER — Ambulatory Visit (HOSPITAL_COMMUNITY)
Admission: RE | Admit: 2021-06-18 | Discharge: 2021-06-18 | Disposition: A | Discharge status: Home / Self Care | Payer: Medicare Other | Source: Ambulatory Visit | Attending: Internal Medicine | Admitting: Internal Medicine

## 2021-06-18 ENCOUNTER — Other Ambulatory Visit: Payer: Self-pay

## 2021-06-18 VITALS — BP 158/72 | HR 73 | Ht 63.0 in | Wt 168.2 lb

## 2021-06-18 DIAGNOSIS — Z880 Allergy status to penicillin: Secondary | ICD-10-CM | POA: Diagnosis not present

## 2021-06-18 DIAGNOSIS — I5032 Chronic diastolic (congestive) heart failure: Secondary | ICD-10-CM | POA: Diagnosis not present

## 2021-06-18 DIAGNOSIS — Z9862 Peripheral vascular angioplasty status: Secondary | ICD-10-CM | POA: Diagnosis not present

## 2021-06-18 DIAGNOSIS — Z9981 Dependence on supplemental oxygen: Secondary | ICD-10-CM | POA: Diagnosis not present

## 2021-06-18 DIAGNOSIS — Z888 Allergy status to other drugs, medicaments and biological substances status: Secondary | ICD-10-CM | POA: Insufficient documentation

## 2021-06-18 DIAGNOSIS — I480 Paroxysmal atrial fibrillation: Secondary | ICD-10-CM | POA: Insufficient documentation

## 2021-06-18 DIAGNOSIS — I4892 Unspecified atrial flutter: Secondary | ICD-10-CM | POA: Insufficient documentation

## 2021-06-18 DIAGNOSIS — I2721 Secondary pulmonary arterial hypertension: Secondary | ICD-10-CM | POA: Insufficient documentation

## 2021-06-18 DIAGNOSIS — I252 Old myocardial infarction: Secondary | ICD-10-CM | POA: Insufficient documentation

## 2021-06-18 DIAGNOSIS — Z515 Encounter for palliative care: Secondary | ICD-10-CM | POA: Insufficient documentation

## 2021-06-18 DIAGNOSIS — E1151 Type 2 diabetes mellitus with diabetic peripheral angiopathy without gangrene: Secondary | ICD-10-CM | POA: Diagnosis not present

## 2021-06-18 DIAGNOSIS — Z79899 Other long term (current) drug therapy: Secondary | ICD-10-CM | POA: Insufficient documentation

## 2021-06-18 DIAGNOSIS — I110 Hypertensive heart disease with heart failure: Secondary | ICD-10-CM | POA: Diagnosis not present

## 2021-06-18 DIAGNOSIS — Z66 Do not resuscitate: Secondary | ICD-10-CM | POA: Insufficient documentation

## 2021-06-18 DIAGNOSIS — Z881 Allergy status to other antibiotic agents status: Secondary | ICD-10-CM | POA: Diagnosis not present

## 2021-06-18 DIAGNOSIS — Z7902 Long term (current) use of antithrombotics/antiplatelets: Secondary | ICD-10-CM | POA: Insufficient documentation

## 2021-06-18 DIAGNOSIS — Z885 Allergy status to narcotic agent status: Secondary | ICD-10-CM | POA: Insufficient documentation

## 2021-06-18 DIAGNOSIS — I2510 Atherosclerotic heart disease of native coronary artery without angina pectoris: Secondary | ICD-10-CM | POA: Diagnosis not present

## 2021-06-18 DIAGNOSIS — I5042 Chronic combined systolic (congestive) and diastolic (congestive) heart failure: Secondary | ICD-10-CM | POA: Diagnosis not present

## 2021-06-18 DIAGNOSIS — Z955 Presence of coronary angioplasty implant and graft: Secondary | ICD-10-CM | POA: Insufficient documentation

## 2021-06-18 DIAGNOSIS — I739 Peripheral vascular disease, unspecified: Secondary | ICD-10-CM | POA: Diagnosis not present

## 2021-06-18 DIAGNOSIS — G4733 Obstructive sleep apnea (adult) (pediatric): Secondary | ICD-10-CM | POA: Diagnosis not present

## 2021-06-18 DIAGNOSIS — Z794 Long term (current) use of insulin: Secondary | ICD-10-CM | POA: Diagnosis not present

## 2021-06-18 DIAGNOSIS — I10 Essential (primary) hypertension: Secondary | ICD-10-CM | POA: Diagnosis not present

## 2021-06-18 DIAGNOSIS — Z87891 Personal history of nicotine dependence: Secondary | ICD-10-CM | POA: Diagnosis not present

## 2021-06-18 DIAGNOSIS — Z7901 Long term (current) use of anticoagulants: Secondary | ICD-10-CM | POA: Diagnosis not present

## 2021-06-18 MED ORDER — AMLODIPINE BESYLATE 2.5 MG PO TABS: 2.5000 mg | ORAL_TABLET | Freq: Every day | ORAL | 3 refills | Status: DC

## 2021-06-18 NOTE — Progress Notes (Signed)
Advanced Heart Failure Clinic Note   Date:  06/18/2021   ID:  [name], DOB [birth year], MRN [id number]  Location: Home  Provider location: [facility] [facility] Type of Visit: Established patient  PCP:  [name], MD  Cardiologist:  Pixie Casino, MD Primary HF: Larry Alcock  Chief Complaint: Heart Failure follow-up   History of Present Illness:  [name] is a 87 y.o. female with CAD s/p 1v stent, HTN, atrial fibrillation/A flutter and chronic prior systolic heart failure, (which was likely rate related) and diastolic heart failure. She was referred by Dr. Lovena Le for further evaluation of Pulmonary HTN.   Echo 2/22 EF 40-45%   Underwent R/L cath 05/07/14 Which showed stable CAD and significant PH with normal PVR.   In 4/21 saw Dr. Scot Dock and had LE angio with severe PAD underwent stenting of 70% R external iliac   Here with her daughter for f/u. At last visit she enrolled with home Palliative Care. Feels much better since being off meds. Walking every day. SBP has been high and Palliative Care NP started amlodipine 2.5 daily SBP 140-160s. Denies CP. Mild DOE. Continues to do OT/PT. Remains on eliquis and Plavix. No bleeding. + Frequent bruising.    Cardiac studies:  Myoview 9/20  Nuclear stress EF: 56%. There was no ST segment deviation noted during stress. Defect 1: There is a medium defect of moderate severity present in the mid inferolateral, apical inferior and apical lateral location. Findings consistent with possible mild ischemia in the apical inferior and inferolateral regions. The left ventricular ejection fraction is normal (55-65%).    Admitted in 5/21 with NSTEMI hstrop peak 1,455. Echo 5/21: EF 50-55%  Cath 05/16/20 with  1. Severe 3 vessel obstructive CAD 2. Severe pulmonary HTN 3. Successful PCI of the proximal LCx/OM1 with DES x 1 with IVUS guidance 4. Successful PCI of the proximal LAD with orbital atherectomy  and DES x 1  RA = 6 RV = 75/6 PA = 77/26 (45) PCW = 10 Fick cardiac output/index = 4.0/2.2 PVR = 8.8 WU FA sat = 99% PA sat = 67%, 73%  Admitted 2/22 with volume overload. Diuresed. Repeat RHC on 02/04/21 as below. Diuresed and started on sildenafil. D/c weight 167  RA = 6 RV = 88/5 PA =  86/33 (53) PCW = 21 Fick cardiac output/index = 3.4/1.9 PVR = 9.4 WU Ao sat = 97% PA sat = 64%, 64% PAPi = 8.8    Past Medical History:  Diagnosis Date   Anemia    iron defficiency   Anginal pain (HCC)    Arthritis    "knees, feet, hands; joints" (05/27/2015)   Asthma    Atrial fibrillation or flutter    s/p RFCA 7/08;   s/p DCCV in past;   previously on amiodarone;  amio stopped due to lung toxicity   CAD (coronary artery disease)    s/p NSTEMI tx with BMS to OM1 3/08;  cath 3/08: pOM 99% tx with PCI, pLAD 20%, ? mod stenosis at the AM   CAP (community acquired pneumonia) 05/24/2015   CHF (congestive heart failure) ([place])    Chronic diastolic heart failure ([place])    echo 11/11:  EF 55-60%, severe LVH, mod LAE, mild MR, mildly increased PASP   COPD (chronic obstructive pulmonary disease) (HCC)    Degenerative joint disease    Dystrophy, corneal stromal    Heart murmur    HLD (hyperlipidemia)  HTN (hypertension)    essential nos   Hypopotassemia    PMH of   Muscle pain    Myocardial infarction ([place]) 2008   OSA on CPAP    Osteoporosis    Pneumonia 05/27/2015   Protein calorie malnutrition (HCC)    Rash and nonspecific skin eruption    both arms,awaiting bio   dr Delman Cheadle   Seasonal allergies    Shortness of breath dyspnea    Type II diabetes mellitus ([place])    Dr Loanne Drilling   Past Surgical History:  Procedure Laterality Date   A FLUTTER ABLATION     Dr Lovena Le   ABDOMINAL AORTOGRAM W/LOWER EXTREMITY Bilateral 04/11/2020   Procedure: ABDOMINAL AORTOGRAM W/LOWER EXTREMITY;  Surgeon: Angelia Mould, MD;  Location: [facility];  Service: Cardiovascular;  Laterality:  Bilateral;   CATARACT EXTRACTION W/ INTRAOCULAR LENS  IMPLANT, BILATERAL Bilateral    CHOLECYSTECTOMY     COLONOSCOPY  6/07   2 polyps Dr. Kinnie Feil in [place]   colonoscopy with polypectomy      X 2; [facility]   CORNEAL TRANSPLANT Bilateral    CORONARY ANGIOPLASTY WITH STENT PLACEMENT  02/2007   BMS w/Dr Lovena Le   CORONARY ATHERECTOMY N/A 05/16/2020   Procedure: CORONARY ATHERECTOMY;  Surgeon: Martinique, Peter M, MD;  Location: [facility];  Service: Cardiovascular;  Laterality: N/A;   CORONARY BALLOON ANGIOPLASTY N/A 05/16/2020   Procedure: CORONARY BALLOON ANGIOPLASTY;  Surgeon: Martinique, Peter M, MD;  Location: [facility];  Service: Cardiovascular;  Laterality: N/A;   CORONARY STENT INTERVENTION N/A 05/16/2020   Procedure: CORONARY STENT INTERVENTION;  Surgeon: Martinique, Peter M, MD;  Location: [facility];  Service: Cardiovascular;  Laterality: N/A;   EYE SURGERY     gravid 2 para 2     INTRAVASCULAR ULTRASOUND/IVUS N/A 05/16/2020   Procedure: Intravascular Ultrasound/IVUS;  Surgeon: Martinique, Peter M, MD;  Location: [facility];  Service: Cardiovascular;  Laterality: N/A;   KNEE CARTILAGE SURGERY Right    LEFT AND RIGHT HEART CATHETERIZATION WITH CORONARY ANGIOGRAM N/A 05/07/2014   Procedure: LEFT AND RIGHT HEART CATHETERIZATION WITH CORONARY ANGIOGRAM;  Surgeon: Peter M Martinique, MD;  Location: [facility];  Service: Cardiovascular;  Laterality: N/A;   PERIPHERAL VASCULAR INTERVENTION  04/11/2020   Procedure: PERIPHERAL VASCULAR INTERVENTION;  Surgeon: Angelia Mould, MD;  Location: [facility];  Service: Cardiovascular;;   RIGHT HEART CATH N/A 02/04/2021   Procedure: RIGHT HEART CATH;  Surgeon: Jolaine Artist, MD;  Location: [facility];  Service: Cardiovascular;  Laterality: N/A;   RIGHT/LEFT HEART CATH AND CORONARY ANGIOGRAPHY N/A 05/16/2020   Procedure: RIGHT/LEFT HEART CATH AND CORONARY ANGIOGRAPHY;  Surgeon: Jolaine Artist, MD;  Location: [place] [facility];  Service: Cardiovascular;  Laterality: N/A;   TOTAL KNEE ARTHROPLASTY Right 06/28/2016   Procedure: TOTAL KNEE ARTHROPLASTY;  Surgeon: Frederik Pear, MD;  Location: [facility];  Service: Orthopedics;  Laterality: Right;     Current Outpatient Medications  Medication Sig Dispense Refill   acetaminophen (TYLENOL) 325 MG tablet Take 2 tablets (650 mg total) by mouth every 6 (six) hours as needed for mild pain or headache. 20 tablet 0   amLODipine (NORVASC) 2.5 MG tablet Take 1 tablet (2.5 mg total) by mouth daily. 90 tablet 3   carvedilol (COREG) 6.25 MG tablet TAKE 1 TABLET BY MOUTH  TWICE DAILY WITH MEALS 180 tablet 3   clopidogrel (PLAVIX) 75 MG tablet TAKE  1 TABLET BY MOUTH ONCE DAILY WITH BREAKFAST 90 tablet 3   ELIQUIS 5 MG TABS tablet TAKE 1 TABLET BY MOUTH  TWICE DAILY 60 tablet 11   glucose blood (ONETOUCH VERIO) test strip 1 each by Other route 2 (two) times daily. And lancets 2/day 200 strip 3   Insulin Lispro Prot & Lispro (HUMALOG MIX 75/25 KWIKPEN) (75-25) 100 UNIT/ML Kwikpen 32 units with breakfast and 6 units with dinner. 15 mL 11   Lancets (ONETOUCH DELICA PLUS [id number]) MISC USE TO MONITOR GLUCOSE  LEVELS TWICE DAILY 200 each 3   Multiple Vitamins-Minerals (CENTRUM SILVER ULTRA WOMENS PO) Take 1 tablet by mouth daily.     nitroGLYCERIN (NITROSTAT) 0.4 MG SL tablet Place 1 tablet (0.4 mg total) under the tongue every 5 (five) minutes as needed for chest pain. Reported on 01/28/2016 30 tablet 3   NOVOFINE PLUS PEN NEEDLE 32G X 4 MM MISC SMARTSIG:1 Each SUB-Q Daily 200 each 1   OXYGEN Inhale 3 % into the lungs continuous.     potassium chloride SA (KLOR-CON) 20 MEQ tablet TAKE 2 TABLETS BY MOUTH  DAILY 180 tablet 3   torsemide (DEMADEX) 20 MG tablet Take 2 tablets (40 mg total) by mouth 2 (two) times daily. 120 tablet 3   No current facility-administered medications for this encounter.    Allergies:   Levaquin [levofloxacin], Lobster [shellfish allergy], Penicillins,  Valsartan, Codeine, Lipitor [atorvastatin], Oxycodone, Statins, Tramadol, Amlodipine besylate, Crestor [rosuvastatin calcium], and Tramadol hcl   Social History:  The patient  reports that she quit smoking about 52 years ago. Her smoking use included cigarettes. She has a 4.50 pack-year smoking history. She has never used smokeless tobacco. She reports current alcohol use. She reports that she does not use drugs.   Family History:  The patient's family history includes Arthritis in her father, maternal aunt, and mother; Breast cancer in her maternal aunt; Diabetes in her mother; Heart attack (age of onset: 2) in her father; Hypertension in her father and mother; Transient ischemic attack in her mother.   ROS:  Please see the history of present illness.   All other systems are personally reviewed and negative.   Vitals:   06/18/21 1208  BP: (!) 158/72  Pulse: 73  SpO2: 97%  Weight: 76.3 kg (168 lb 3.2 oz)  Height: 5\' 3"  (1.6 m)    Exam:   General:  Looks younger than stated age. No resp difficulty HEENT: normal Neck: supple. no JVD. Carotids 2+ bilat; no bruits. No lymphadenopathy or thryomegaly appreciated. Cor: PMI nondisplaced. Regular rate & rhythm. No rubs, gallops or murmurs. Lungs: clear Abdomen: soft, nontender, nondistended. No hepatosplenomegaly. No bruits or masses. Good bowel sounds. Extremities: no cyanosis, clubbing, rash, trace edema RLE Neuro: alert & orientedx3, cranial nerves grossly intact. moves all 4 extremities w/o difficulty. Affect pleasant   Recent Labs: 01/30/2021: B Natriuretic Peptide 171.8 02/05/2021: ALT 18; BUN 16; Creatinine, Ser 0.84; Hemoglobin 14.7; Magnesium 1.8; Platelets 156; Potassium 3.7; Sodium 139  Personally reviewed   Wt Readings from Last 3 Encounters:  06/18/21 76.3 kg (168 lb 3.2 oz)  05/28/21 77.6 kg (171 lb)  05/11/21 76.3 kg (168 lb 3.2 oz)     ASSESSMENT AND PLAN:   1) Chronic diastolic CHF:  - Echo 05/2262 EF 60-65%, LA  severely dilated, Echo 10/2017: EF 65-70%, grade 3 DD - Echo 5/21 EF 50-55% - Echo 2/22 EF 40-45% - Overall stable NYHA II-III - Volume status ok  - Continue torsemide  40 bid - Not on Entresto due to h/o angioedema with ARB - Not interested in further med titration.    2) CAD - H/o of stent in 2008 - NSTEMI 5/21. Cath as above. S/p PCI to LAD & OM - No s/s angina - On Plavix, apixaban. Will stop Plavix as she is 1 year out from PCI and high risk for bleeding.  - Unable to tolerate statins. On Repatha/zetia per [facility].  3) PAD - severe  - s/p stenting to R external iliac with Dr. Scot Dock - Continues with mild claudication   4) PAH - Moderate to severe by cath 5/21 and 2/22.  - suspect multifactorial  - wearing CPAP - sildenafil started 2/22 having trouble with side effetcs  5) Paroxysmal atrial fib/flutter - remains in AFL today. Rate controlled. Asx This patients CHA2DS2-VASc Score and unadjusted Ischemic Stroke Rate (% per year) is equal to 7.2 % stroke rate/year from a score of 5 Above score calculated as 2 points each if present [Age > 75, or Stroke/TIA/TE] - Continue Eliquis. No bleeding   6) OSA/COPD - Follows with Dr. Halford Chessman.  - Compliant with CPAP   7) HTN - Blood pressure moderately elevated.  - Amlodipine 2.5 has been restarted.  - Goal BP 130-160  - Can take extra amlodipine 2.5 for SBP > 170 8) DM2 - Stopped Jardiance due to cost  - No change  9) HL  - Unable to tolerate statins. On Repatha/Zetia - Follows with [facility]id Clinic  10) DNR/DNI - continue Palliative Care support    Signed, Glori Bickers, MD  06/18/2021 12:39 PM  Advanced Heart Failure [place] [address] Heart and [place] [address] [phone] (office) [phone] (fax)

## 2021-06-18 NOTE — Patient Instructions (Signed)
1. Stop Plavix 2. May take extra Amlodipine 2.5 mg if systolic BP greater than 128. 3. Call Heart Failure Clinic in November to schedule 6 month follow up appointment with Dr. Haroldine Laws.

## 2021-06-22 ENCOUNTER — Ambulatory Visit (INDEPENDENT_AMBULATORY_CARE_PROVIDER_SITE_OTHER): Payer: Medicare Other | Admitting: *Deleted

## 2021-06-22 DIAGNOSIS — E1122 Type 2 diabetes mellitus with diabetic chronic kidney disease: Secondary | ICD-10-CM | POA: Diagnosis not present

## 2021-06-22 DIAGNOSIS — I5032 Chronic diastolic (congestive) heart failure: Secondary | ICD-10-CM

## 2021-06-22 DIAGNOSIS — Z794 Long term (current) use of insulin: Secondary | ICD-10-CM

## 2021-06-22 DIAGNOSIS — N1831 Chronic kidney disease, stage 3a: Secondary | ICD-10-CM | POA: Diagnosis not present

## 2021-06-22 NOTE — Patient Instructions (Signed)
Visit [name], it was nice talking with you today.   Please read over the attached information, and keep up the great work monitoring your conditon at home, following a healthy diet, and exercising/ staying active  As we discussed today, I have reached out to your pulmonary provider to request that they contact you about the status of your request for a portable oxygen concentrator- I have asked that they contact you; please listen out for a call from them to follow up about this.   I look forward to talking to you again for an update on Monday September 14, 2021 at 09:00 am- please be listening out for my call that day.  I will call as close to 9:00 am as possible; I look forward to hearing about your progress.   Please don't hesitate to contact me if I can be of assistance to you before our next scheduled appointment.   [name], RN, BSN, [facility] RN Care Coordination- [place] [phone]: direct office [phone]: mobile    PATIENT GOALS:  Goals Addressed             This Visit's Progress    Monitor and Manage My Blood Sugar-Diabetes Type 2   On track    Timeframe:  Long-Range Goal Priority:  Medium Start Date:       02/17/21                      Expected End Date:       12/25/21               Follow Up Date 09/14/21  Please continue to:    Check blood sugar at prescribed times, or if I feel it is too high or too low.  Keep up entering blood sugar readings and medication or insulin into daily log Take the blood sugar log to all doctor visits Continue your excellent efforts to follow low sugar/ carb modified diet-- good job controlling portion size: keep up the great work  We talked about your recent A1-C level of 8.3 on 05/28/21--  this correlates to an average blood sugar of 185-190: keep up the great work staying active, following a low-carb/ low sugar diet and controlling your portions! Your next visit with your  endocrinologist is scheduled for 09/08/2021  Continue light exercise with PT at [facility] 4-5 days/week: great job staying active with your PT/OT team and walking for exercise now that the weather is nice- keep up the great work  Continue your prescribed medication regimen  Contacting care providers for new or worsened symptoms, concerns or questions   Why is this important?   Checking your blood sugar at home helps to keep it from getting very high or very low.  Writing the results in a diary or log helps the doctor know how to care for you.  Your blood sugar log should have the time, date and the results.  Also, write down the amount of insulin or other medicine that you take.  Other information, like what you ate, exercise done and how you were feeling, will also be helpful.           Track and Manage Fluids and Swelling-Heart Failure   On track    Timeframe:  Long-Range Goal Priority:  Medium Start Date:         02/17/21  Expected End Date:       12/25/21               Follow Up Date 09/14/21   Continue using home oxygen as prescribed; as we discussed today, I will reach out to the pulmonary team to request follow up on your request for portable oxygen concentrator- please listen out for follow up from the pulmonary team Continue to monitor your oxygen levels regularly at home: with activity and at rest, and if you ever start to feel unusually short of breath Continue to weigh daily: you reported weights at home today consistently at 163 lbs; You are on track with maintaining your goal weight- keep up the great work tracking weight in your health journal Call office if I gain more than 2 pounds in one day or 5 pounds in one week Continue using salt in moderation and keeping up with the amount of fluid you take in every day Watch for swelling in feet, ankles and legs every day: continue to wear the compression stockings- I am glad they are helping with  keeping the swelling down I am glad that you are participating in the [facility] program and that they are making regular home visits with you   Why is this important?   It is important to check your weight daily and watch how much salt and liquids you have.  It will help you to manage your heart failure.            Diabetes Mellitus and Nutrition, Adult When you have diabetes, or diabetes mellitus, it is very important to have healthy eating habits because your blood sugar (glucose) levels are greatly affected by what you eat and drink. Eating healthy foods in the right amounts, at about the same times every day, can help you: Control your blood glucose. Lower your risk of heart disease. Improve your blood pressure. Reach or maintain a healthy weight. What can affect my meal plan? Every person with diabetes is different, and each person has different needs for a meal plan. Your health care provider may recommend that you work with a dietitian to make a meal plan that is best for you. Your meal plan may vary depending on factors such as: The calories you need. The medicines you take. Your weight. Your blood glucose, blood pressure, and cholesterol levels. Your activity level. Other health conditions you have, such as heart or kidney disease. How do carbohydrates affect me? Carbohydrates, also called carbs, affect your blood glucose level more than any other type of food. Eating carbs naturally raises the amount of glucose in your blood. Carb counting is a method for keeping track of how many carbs you eat. Counting carbs is important to keep your blood glucose at a healthy level,especially if you use insulin or take certain oral diabetes medicines. It is important to know how many carbs you can safely have in each meal. This is different for every person. Your dietitian can help you calculate how manycarbs you should have at each meal and for each snack. How does  alcohol affect me? Alcohol can cause a sudden decrease in blood glucose (hypoglycemia), especially if you use insulin or take certain oral diabetes medicines. Hypoglycemia can be a life-threatening condition. Symptoms of hypoglycemia, such as sleepiness, dizziness, and confusion, are similar to symptoms of having too much alcohol. Do not drink alcohol if: Your health care provider tells you not to drink. You are pregnant, may be pregnant,  or are planning to become pregnant. If you drink alcohol: Do not drink on an empty stomach. Limit how much you use to: 0-1 drink a day for women. 0-2 drinks a day for men. Be aware of how much alcohol is in your drink. In the [place] one drink equals one 12 oz bottle of beer (355 mL), one 5 oz glass of wine (148 mL), or one 1 oz glass of hard liquor (44 mL). Keep yourself hydrated with water, diet soda, or unsweetened iced tea. Keep in mind that regular soda, juice, and other mixers may contain a lot of sugar and must be counted as carbs. What are tips for following this plan?  Reading food labels Start by checking the serving size on the "Nutrition Facts" label of packaged foods and drinks. The amount of calories, carbs, fats, and other nutrients listed on the label is based on one serving of the item. Many items contain more than one serving per package. Check the total grams (g) of carbs in one serving. You can calculate the number of servings of carbs in one serving by dividing the total carbs by 15. For example, if a food has 30 g of total carbs per serving, it would be equal to 2 servings of carbs. Check the number of grams (g) of saturated fats and trans fats in one serving. Choose foods that have a low amount or none of these fats. Check the number of milligrams (mg) of salt (sodium) in one serving. Most people should limit total sodium intake to less than 2,300 mg per day. Always check the nutrition information of foods labeled as "low-fat" or "nonfat."  These foods may be higher in added sugar or refined carbs and should be avoided. Talk to your dietitian to identify your daily goals for nutrients listed on the label. Shopping Avoid buying canned, pre-made, or processed foods. These foods tend to be high in fat, sodium, and added sugar. Shop around the outside edge of the grocery store. This is where you will most often find fresh fruits and vegetables, bulk grains, fresh meats, and fresh dairy. Cooking Use low-heat cooking methods, such as baking, instead of high-heat cooking methods like deep frying. Cook using healthy oils, such as olive, canola, or sunflower oil. Avoid cooking with butter, cream, or high-fat meats. Meal planning Eat meals and snacks regularly, preferably at the same times every day. Avoid going long periods of time without eating. Eat foods that are high in fiber, such as fresh fruits, vegetables, beans, and whole grains. Talk with your dietitian about how many servings of carbs you can eat at each meal. Eat 4-6 oz (112-168 g) of lean protein each day, such as lean meat, chicken, fish, eggs, or tofu. One ounce (oz) of lean protein is equal to: 1 oz (28 g) of meat, chicken, or fish. 1 egg.  cup (62 g) of tofu. Eat some foods each day that contain healthy fats, such as avocado, nuts, seeds, and fish. What foods should I eat? Fruits Berries. Apples. Oranges. Peaches. Apricots. Plums. Grapes. Mango. Papaya.Pomegranate. Kiwi. Cherries. Vegetables Lettuce. Spinach. Leafy greens, including kale, chard, collard greens, and mustard greens. Beets. Cauliflower. Cabbage. Broccoli. Carrots. Green beans.Tomatoes. Peppers. Onions. Cucumbers. Brussels sprouts. Grains Whole grains, such as whole-wheat or whole-grain bread, crackers, tortillas,cereal, and pasta. Unsweetened oatmeal. Quinoa. Brown or wild rice. Meats and other proteins Seafood. Poultry without skin. Lean cuts of poultry and beef. Tofu. Nuts. Seeds. Dairy Low-fat or  fat-free dairy products such as  milk, yogurt, and cheese. The items listed above may not be a complete list of foods and beverages you can eat. Contact a dietitian for more information. What foods should I avoid? Fruits Fruits canned with syrup. Vegetables Canned vegetables. Frozen vegetables with butter or cream sauce. Grains Refined white flour and flour products such as bread, pasta, snack foods, andcereals. Avoid all processed foods. Meats and other proteins Fatty cuts of meat. Poultry with skin. Breaded or fried meats. Processed meat.Avoid saturated fats. Dairy Full-fat yogurt, cheese, or milk. Beverages Sweetened drinks, such as soda or iced tea. The items listed above may not be a complete list of foods and beverages you should avoid. Contact a dietitian for more information. Questions to ask a health care provider Do I need to meet with a diabetes educator? Do I need to meet with a dietitian? What number can I call if I have questions? When are the best times to check my blood glucose? Where to find more information: American Diabetes Association: diabetes.org Academy of Nutrition and Dietetics: www.eatright.[employer] of Diabetes and Digestive and Kidney Diseases: [employer] Association of Diabetes Care and Education Specialists: www.diabeteseducator.org Summary It is important to have healthy eating habits because your blood sugar (glucose) levels are greatly affected by what you eat and drink. A healthy meal plan will help you control your blood glucose and maintain a healthy lifestyle. Your health care provider may recommend that you work with a dietitian to make a meal plan that is best for you. Keep in mind that carbohydrates (carbs) and alcohol have immediate effects on your blood glucose levels. It is important to count carbs and to use alcohol carefully. This information is not intended to replace advice given to you by your health care provider.  Make sure you discuss any questions you have with your healthcare provider. Document Revised: 11/20/2019 Document Reviewed: 11/20/2019 Elsevier Patient Education  2021 [place].  Patient verbalizes understanding of instructions provided today and agrees to view in [place].  Telephone follow up appointment with care management team member scheduled for:  Monday September 14, 2021 at 09:00 am The patient has been provided with contact information for the care management team and has been advised to call with any health related questions or concerns.   [name], RN, BSN, [facility] RN Care Coordination- [place] [phone]: direct office [phone]: mobile

## 2021-06-22 NOTE — Chronic Care Management (AMB) (Signed)
Chronic Care Management   CCM RN Visit Note  06/22/2021 Name: [name] MRN: [id number] DOB: 12/22/1931  Subjective: [name] is a 87 y.o. year old female who is a primary care patient of Burns, Stacy J, MD. The care management team was consulted for assistance with disease management and care coordination needs.    Engaged with patient by telephone for follow up visit in response to provider referral for case management and/or care coordination services.   Consent to Services:  The patient was given information about Chronic Care Management services, agreed to services, and gave verbal consent prior to initiation of services.  Please see initial visit note for detailed documentation.  Patient agreed to services and verbal consent obtained.   Assessment: Review of patient past medical history, allergies, medications, health status, including review of consultants reports, laboratory and other test data, was performed as part of comprehensive evaluation and provision of chronic care management services.   CCM Care Plan  Allergies  Allergen Reactions   Levaquin [Levofloxacin] Shortness Of Breath and Swelling    angioedema   Lobster [Shellfish Allergy] Other (See Comments)    angioedema   Penicillins Rash    Has patient had a PCN reaction causing immediate rash, facial/tongue/throat swelling, SOB or lightheadedness with hypotension: Yes Has patient had a PCN reaction causing severe rash involving mucus membranes or skin necrosis: No Has patient had a PCN reaction that required hospitalization: No Has patient had a PCN reaction occurring within the last 10 years: No If all of the above answers are "NO", then may proceed with Cephalosporin use.   Valsartan Other (See Comments)    REACTION: angioedema   Codeine Other (See Comments)    Mental status changes   Lipitor [Atorvastatin] Other (See Comments)    weakness   Oxycodone Other (See Comments)    hallucinations    Statins     Made her too weak   Tramadol    Amlodipine Besylate Other (See Comments)    REACTION: tingling in lips & gum edema 7/12: talked to patient, states she is tolerating well   Crestor [Rosuvastatin Calcium] Rash   Tramadol Hcl Nausea And Vomiting    Outpatient Encounter Medications as of 06/22/2021  Medication Sig Note   acetaminophen (TYLENOL) 325 MG tablet Take 2 tablets (650 mg total) by mouth every 6 (six) hours as needed for mild pain or headache.    amLODipine (NORVASC) 2.5 MG tablet Take 1 tablet (2.5 mg total) by mouth daily. May take extra 2.5 mg for systolic BP > 180    carvedilol (COREG) 6.25 MG tablet TAKE 1 TABLET BY MOUTH  TWICE DAILY WITH MEALS    ELIQUIS 5 MG TABS tablet TAKE 1 TABLET BY MOUTH  TWICE DAILY    glucose blood (ONETOUCH VERIO) test strip 1 each by Other route 2 (two) times daily. And lancets 2/day    Insulin Lispro Prot & Lispro (HUMALOG MIX 75/25 KWIKPEN) (75-25) 100 UNIT/ML Kwikpen 32 units with breakfast and 6 units with dinner.    Lancets (ONETOUCH DELICA PLUS LANCET33G) MISC USE TO MONITOR GLUCOSE  LEVELS TWICE DAILY    Multiple Vitamins-Minerals (CENTRUM SILVER ULTRA WOMENS PO) Take 1 tablet by mouth daily.    nitroGLYCERIN (NITROSTAT) 0.4 MG SL tablet Place 1 tablet (0.4 mg total) under the tongue every 5 (five) minutes as needed for chest pain. Reported on 01/28/2016 03/10/2021: 03/10/21: Reports has not needed recently   NOVOFINE PLUS PEN NEEDLE 32G X 4   MM MISC SMARTSIG:1 Each SUB-Q Daily    OXYGEN Inhale 3 % into the lungs continuous.    potassium chloride SA (KLOR-CON) 20 MEQ tablet TAKE 2 TABLETS BY MOUTH  DAILY    torsemide (DEMADEX) 20 MG tablet Take 2 tablets (40 mg total) by mouth 2 (two) times daily.    No facility-administered encounter medications on file as of 06/22/2021.    Patient Active Problem List   Diagnosis Date Noted   Chronic respiratory failure with hypoxia (HCC) 05/12/2021   Acute respiratory failure with hypoxia and  hypercapnia (HCC) 01/31/2021   Elevated troponin 01/31/2021   Type 2 diabetes mellitus with hyperlipidemia (HCC) 01/31/2021   Neuropathy 08/19/2020   PAD (peripheral artery disease) (HCC) 02/29/2020   Chronic kidney disease (CKD) 02/20/2020   Osteoarthritis 08/01/2019   Arthritis of left sacroiliac joint 05/04/2017   Osteopenia 02/17/2017   Piriformis syndrome of left side 02/16/2017   Acquired leg length discrepancy 02/16/2017   COPD GOLD I  01/27/2017   Lumbar radiculopathy, acute 01/27/2017   Granuloma annulare 07/27/2016   Numbness of fingers 07/27/2016   Primary osteoarthritis of right knee 06/27/2016   Rash and nonspecific skin eruption 06/09/2016   Diabetes (HCC) 03/07/2016   Acute on chronic combined systolic (congestive) and diastolic (congestive) heart failure (HCC)    OSA  08/28/2014   Chronic diastolic heart failure (HCC) 07/30/2014   Nocturnal hypoxemia 07/09/2014   SOB (shortness of breath) 05/15/2014   Pulmonary HTN (HCC) 05/15/2014   Band keratopathy 01/16/2014   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   Hyperthyroidism 11/04/2011   Multinodular goiter 10/14/2011   Atrophy, Fuchs' 09/29/2011   ATRIAL FIBRILLATION 06/03/2010   VITILIGO 11/07/2009   SINOATRIAL NODE DYSFUNCTION 06/16/2009   Atrial flutter (HCC) 06/25/2008   HYPERLIPIDEMIA 01/29/2008   Coronary atherosclerosis 01/29/2008   HTN (hypertension) 10/30/2007   Conditions to be addressed/monitored:CHF and DMII  Care Plan : Heart Failure (Adult)  Updates made by Tousey, Laine M, RN since 06/22/2021 12:00 AM     Problem: Symptom Exacerbation (Heart Failure)   Priority: High     Long-Range Goal: Symptom Exacerbation Prevented or Minimized   Start Date: 02/17/2021  Expected End Date: 12/25/2021  This Visit's Progress: On track  Recent Progress: On track  Priority: Medium  Note:   Current Barriers:  Knowledge deficits related to use of new home Oxygen and need for ongoing reinforcement of basic  heart failure pathophysiology and self care management Unable to independently verbalize long-term plan for use of home O2, newly prescribed Nurse Case Manager Clinical Goal(s):  Over the next 8 months, patient will continue following heart failure action plan as evidenced by patient reporting during CCM RN CM outreach of: Monitoring/ recording daily weight at home Using home O2 with activity and CPAP at night Following action plan for development of signs/ symptoms CHF exacerbation/ yellow CHF zone Interventions:  Collaboration with Burns, Stacy J, MD regarding development and update of comprehensive plan of care as evidenced by provider attestation and co-signature Inter-disciplinary care team collaboration (see longitudinal plan of care) Chart reviewed including relevant office notes, upcoming scheduled appointments, and lab results Discussed current  clinical condition with patient and confirmed no current clinical or medication concerns; confirmed patient continues to manage medications independently at home and reports adherence to medication regimen: confirmed patient is no longer taking plavix and is only taking Eliquis as ACT Reviewed recent heart failure clinic provider office visit 06/18/21 with patient: patient denies follow up questions and is   able to verbalize post-visit instructions Reviewed recent weights at home with patient: patient reports general weight ranges at home consistently at "163 lbs" since our last outreach conversation- this is essentially in same range as previous reports; confirmed no recent signs/ symptoms yellow CHF zone Reviewed 05/11/21 pulmonary provider office visit: patient continues to report no follow up on her request/ ordered portable oxygen  concentrator; states she contacted [facility] and was told that they are unable to process order, patient states " they told me they no longer have that program;" care coordination outreach to pulmonary provider  requesting follow-up with patient around status of order, possible need for re-order with another DME/ home O2 company Confirmed patient remains active with PT/OT at [facility] and is also walking "about every morning" in between PT sessions now that weather is nice tolerating well, positive reinforcement provided Confirmed patient continues using home O2 and has been able to wean home O2 at times: not having to use when she is at rest; using now only with activity and HS; continues to use home CPAP qHS; continues using regularly at home between 2-3 L/min; continues monitoring/ recording blood pressures ("all very good and in right range") during PT sessions and at home QD; continues monitoring/ recording SaO2 levels ("all in the 90's") QD and prn; continues wearing compression stockings QD Confirmed patient able to verbalize with minimal prompting previously provided education around signs/ symptoms yellow CHF zone/ weight gain guidelines in setting of CHF along with corresponding action plan Confirmed patient continues to endorse following low salt, heart healthy diet: discussed strategies that patient has recently taken to continue following low salt- diet; continues to report her [facility] is now not adding salt to prepared food Reviewed recent home visits with [facility] team: re-confirmed patient happy with palliative care involvement: encouraged her ongoing engagement with Palliative Care team- scheduled for next week per patient report Reviewed upcoming provider appointments with patient and confirmed that patient has plans to attend all as scheduled: 09/08/21 endocrinology provider; re-confirmed no transportation issues: [facility] provides transportation and patient's daughter's also assists with transportation as indicated Discussed plans with patient for ongoing care management follow up and confirmed patient has direct contact information for care management team Patient Goals/Self-Care  Activities Continue using home oxygen as prescribed; as we discussed today, I will reach out to the pulmonary team to request follow up on your request for portable oxygen concentrator- please listen out for follow up from the pulmonary team Continue to monitor your oxygen levels regularly at home: with activity and at rest, and if you ever start to feel unusually short of breath Continue to weigh daily: you reported weights at home today consistently at 163 lbs; You are on track with maintaining your goal weight- keep up the great work tracking weight in your health journal Call office if I gain more than 2 pounds in one day or 5 pounds in one week Continue using salt in moderation and keeping up with the amount of fluid you take in every day Watch for swelling in feet, ankles and legs every day: continue to wear the compression stockings- I am glad they are helping with keeping the swelling down I am glad that you are participating in the [facility] Team program and that they are making regular home visits with you Follow Up Plan:  Telephone follow up appointment with care management team member scheduled for: Monday September 14, 2021 at 9:00 am The patient has been provided  with contact information for the care management team and has been advised to call with any health related questions or concerns.      Care Plan : Diabetes Type 2 (Adult)  Updates made by Tousey, Laine M, RN since 06/22/2021 12:00 AM     Problem: Glycemic Management (Diabetes, Type 2)   Priority: High     Long-Range Goal: Glycemic Management Optimized   Start Date: 02/17/2021  Expected End Date: 12/25/2021  This Visit's Progress: On track  Recent Progress: On track  Priority: Medium  Note:   Objective:  Lab Results  Component Value Date   HGBA1C 9.5 (A) 12/02/2020   Lab Results  Component Value Date   CREATININE 0.84 02/05/2021   CREATININE 0.79 02/04/2021   CREATININE 0.84 02/03/2021   No  results found for: EGFR Current Barriers:  Knowledge Deficits related to basic Diabetes pathophysiology and self care/management: patient could benefit from ongoing reinforcement of self-health management strategies for DM Knowledge Deficits related to medications used for management of diabetes- patient actively engaged with CCM Pharmacist at PCP office No self-care deficits: patient resides at independent living facility and has support if needed; patient verbalizes accurate general understanding of plan of care post- recent hospital discharge 02/05/21 Case Manager Clinical Goal(s):  Over the next 8 months, patient will demonstrate ongoing adherence to prescribed treatment plan for diabetes self care/management as evidenced by:  Daily monitoring and recording of CBG twice daily  Adherence to ADA/ carb modified diet  Light exercise with established PT at Independent Living facility 4-5 days/week; walking when weather is nice  Adherence to prescribed medication regimen  Contacting care providers for new or worsened symptoms, concerns or questions Interventions:  Collaboration with Burns, Stacy J, MD regarding development and update of comprehensive plan of care as evidenced by provider attestation and co-signature Inter-disciplinary care team collaboration (see longitudinal plan of care) Review of patient status, including review of consultants reports, relevant laboratory and other test results, and medications completed Reviewed recent office visit with endocrinologist 05/28/21 with patient: confirmed that she is able to verbalize changes made to insulin dosing and confirmed that she is taking insulin as prescribed and has no questions/ concerns- positive reinforcement provided Discussed patient's most recently obtained A1-C level of 8.3 (05/29/21): reiterated previously provided education around general correlation of average blood sugars at home to A1-C levels Reviewed recent fasting blood sugars  with patient: she reports ranges at home consistently between 120-150; has not been monitoring post-prandial values recently, but verbalizes plans to re-start in light of slightly increased A1-C: this was encouraged.  Confirmed no recent low blood sugars at home Ongoing discussion around patient's strategies at home to limit portion sizes, change eating habits, increase activity to decrease blood sugars at home: she is doing all and has mainatined very well post- February hospital visit: positive reinforcement and encouragement to continue provided Reviewed upcoming follow up endocrinology appointment with patient scheduled for September 08, 2021: confirmed she has plans to attend as scheduled Discussed plans with patient for ongoing care management follow up and confirmed patient has direct contact information for care management team Patient Goals: Check blood sugar at prescribed times, or if I feel it is too high or too low.  Keep up entering blood sugar readings and medication or insulin into daily log Take the blood sugar log to all doctor visits Continue your excellent efforts to follow low sugar/ carb modified diet-- good job controlling portion size: keep up the great work  We   talked about your recent A1-C level of 8.3 on 05/28/21--  this correlates to an average blood sugar of 185-190: keep up the great work staying active, following a low-carb/ low sugar diet and controlling your portions! Your next visit with your endocrinologist is scheduled for 09/08/2021  Continue light exercise with PT at Independent Living facility 4-5 days/week: great job staying active with your PT/OT team and walking for exercise now that the weather is nice- keep up the great work  Continue your prescribed medication regimen  Contacting care providers for new or worsened symptoms, concerns or questions Self-Care Activities Self administers oral medications as prescribed Self administers insulin as prescribed Attends  all scheduled provider appointments Checks blood sugars as prescribed Adheres to prescribed ADA/carb modified and is motivated to ongoing learning around same Follow Up Plan:  Telephone follow up appointment with care management team member scheduled for: Monday 09/14/21 at 9:00 am The patient has been provided with contact information for the care management team and has been advised to call with any health related questions or concerns.      Plan: Telephone follow up appointment with care management team member scheduled for:  Monday September 14, 2021 at 09:00 am The patient has been provided with contact information for the care management team and has been advised to call with any health related questions or concerns.   Laine Mckinney Tousey, RN, BSN, CCRN Alumnus CCM Clinic RN Care Coordination- [facility] [phone]: direct office [phone]: mobile          

## 2021-06-23 DIAGNOSIS — M6281 Muscle weakness (generalized): Secondary | ICD-10-CM | POA: Diagnosis not present

## 2021-06-23 DIAGNOSIS — J9601 Acute respiratory failure with hypoxia: Secondary | ICD-10-CM | POA: Diagnosis not present

## 2021-06-25 ENCOUNTER — Other Ambulatory Visit: Payer: Medicare Other | Admitting: Nurse Practitioner

## 2021-06-25 ENCOUNTER — Other Ambulatory Visit: Payer: Self-pay

## 2021-06-25 VITALS — BP 168/62 | HR 70 | Resp 18

## 2021-06-25 DIAGNOSIS — J9601 Acute respiratory failure with hypoxia: Secondary | ICD-10-CM | POA: Diagnosis not present

## 2021-06-25 DIAGNOSIS — J9611 Chronic respiratory failure with hypoxia: Secondary | ICD-10-CM | POA: Diagnosis not present

## 2021-06-25 DIAGNOSIS — I10 Essential (primary) hypertension: Secondary | ICD-10-CM

## 2021-06-25 DIAGNOSIS — Z515 Encounter for palliative care: Secondary | ICD-10-CM | POA: Diagnosis not present

## 2021-06-25 DIAGNOSIS — E162 Hypoglycemia, unspecified: Secondary | ICD-10-CM | POA: Diagnosis not present

## 2021-06-25 DIAGNOSIS — M6281 Muscle weakness (generalized): Secondary | ICD-10-CM | POA: Diagnosis not present

## 2021-06-25 NOTE — Progress Notes (Signed)
[facility] Palliative Care Consult Note Telephone: [phone]  Fax: [phone]    Date of encounter: 06/25/21 PATIENT NAME: [name] [address] [place] [id number]   [phone] (home)  DOB: [birth year] MRN: [id number]  PRIMARY CARE PROVIDER:    [name], MD,  [place] [address] [phone]  REFERRING PROVIDER:   [name], MD [address] [place],  [place] [address] [phone]  RESPONSIBLE PARTY:    Contact Information     Name Relation Home Work [place] Son [phone]  [phone]   Elowen, Debruyn Daughter [phone]  [phone]     I met face to face with patient in facility.  Palliative Care was asked to follow this patient by consultation request of  Burns, [name], MD to address advance care planning and complex medical decision making. This is a follow up visit.                                 ASSESSMENT AND PLAN / RECOMMENDATIONS:   Advance Care Planning/Goals of Care:  CODE STATUS: DNR Goal of care: Patient's goal of care is comfort while preserving function. Directives: Signed DNR and MOST form present in home, copy on [facility] EMR. Details of MOST form include; comfort measures, determine use or limitation of antibiotics, IV fluid for a defined trial period, no feeding tube.   Symptom Management/Plan: Hypoglycemia: occurred once yesterday secondary to poor oral intake yesterday with insulin administration. Patient on Humalog 32 units with breakfast and 6 units with dinner. Patient encouraged to maintain consistent meal times and to hold insulin if blood sugar less than 100. She verbalized understanding.  Elevated blood pressure: BP today 168/62, HR 70. Patient seen by Dr. Haroldine Laws on 06/18/2021, SBP goal set between 130-160 with advise to take extra Amlodipine 2.21m if SBP > 170. Patient denied chest pain, denied SB, no  worsening edema. Continue current plan of care. Chronic respiratory failure with hypoxia: condition is stable. Continue oxygen supplementation during the day as needed, and CPAP at night for OSA.  Patient awaiting supply of portable oxygen tank from [employer], she is considering getting the supply from [facility] Provided general supply and encouragement. Questions and concerns were addressed. Patient was encouraged to call with questions and/or concerns  Follow up Palliative Care Visit: Palliative care will continue to follow for complex medical decision making, advance care planning, and clarification of goals. Return about 6-8 weeks or prn.  PPS: 70%  HOSPICE ELIGIBILITY/DIAGNOSIS: TBD  CHIEF COMPLAIN:  Hypoglycemia  History obtained from review of Epic EMR and discussion with [name]   HISTORY OF PRESENT ILLNESS:  [name] BALDONADOis a 87y.o. year old female with multiple medical problems including  CAD s/p stents placement, HTN, Afib (on Eliquis), pulmonary hypertension on chronic oxygen use, CHF (EF 50-55% on 05/16/2021), type 2 diabetes (A1c 8), CKD 3, PAD, osteoarthritis, OSA on CPAP.  Patient with report of hypoglycemia, report blood sugar dropped to 60 yesterday evening. Hypoglycemia associated with diaphoresis and lightheadedness. Patient report missing lunch yesterday because she was busy rearranging her room. She report taking glucose tablet with relief. She voiced no other concerns today. Report feeling well. Report her last day with in-home physical therapy is today. She denied fever, denied chills, denied increased SOB, or chest pian. Ten systems reviewed and are negative for  acute change, except as noted in the HPI.   I reviewed available labs, medications, imaging, studies and related documents from the EMR.  Records reviewed and summarized above.   Vitals:   06/25/21 1150  BP: (!) 168/62  Pulse: 70  Resp: 18  SpO2: 93%    Physical Exam: General: well appearing,  cooperative, sitting in chair in NAD EYES: anicteric sclera, no discharge ENMT: intact hearing, oral mucous membranes moist CV:  +1 Bilateral pedal edema Pulmonary: no increased work of breathing, no cough, no wheezes, room air Abdomen: no ascites GU: deferred MSK: moves all extremities, ambulatory Skin: warm and dry, no rash, no open wound  Neuro: Generalized weakness, A and O x 4 Psych: non-anxious affect today Hem/lymph/immuno: no widespread bruising  Past Medical History:  Diagnosis Date   Anemia    iron defficiency   Anginal pain (HCC)    Arthritis    "knees, feet, hands; joints" (05/27/2015)   Asthma    Atrial fibrillation or flutter    s/p RFCA 7/08;   s/p DCCV in past;   previously on amiodarone;  amio stopped due to lung toxicity   CAD (coronary artery disease)    s/p NSTEMI tx with BMS to OM1 3/08;  cath 3/08: pOM 99% tx with PCI, pLAD 20%, ? mod stenosis at the AM   CAP (community acquired pneumonia) 05/24/2015   CHF (congestive heart failure) (HCC)    Chronic diastolic heart failure (HCC)    echo 11/11:  EF 55-60%, severe LVH, mod LAE, mild MR, mildly increased PASP   COPD (chronic obstructive pulmonary disease) (HCC)    Degenerative joint disease    Dystrophy, corneal stromal    Heart murmur    HLD (hyperlipidemia)    HTN (hypertension)    essential nos   Hypopotassemia    PMH of   Muscle pain    Myocardial infarction ([place]) 2008   OSA on CPAP    Osteoporosis    Pneumonia 05/27/2015   Protein calorie malnutrition (HCC)    Rash and nonspecific skin eruption    both arms,awaiting bio   dr Delman Cheadle   Seasonal allergies    Shortness of breath dyspnea    Type II diabetes mellitus (HCC)    Dr Loanne Drilling    I spent 40 minutes providing this consultation. More than 50% of the time in this consultation was spent in counseling and care coordination.  Thank you for the opportunity to participate in the care of [name].  The palliative care team will continue to  follow. Please call our office at [phone] if we can be of additional assistance.   Jari Favre, DNP, AGPCNP-BC  COVID-19 PATIENT SCREENING TOOL Asked and negative response unless otherwise noted:   Have you had symptoms of covid, tested positive or been in contact with someone with symptoms/positive test in the past 5-10 days?

## 2021-06-28 ENCOUNTER — Other Ambulatory Visit (HOSPITAL_COMMUNITY): Payer: Self-pay | Admitting: Internal Medicine

## 2021-07-05 DIAGNOSIS — G4733 Obstructive sleep apnea (adult) (pediatric): Secondary | ICD-10-CM | POA: Diagnosis not present

## 2021-07-05 DIAGNOSIS — I5033 Acute on chronic diastolic (congestive) heart failure: Secondary | ICD-10-CM | POA: Diagnosis not present

## 2021-07-05 DIAGNOSIS — J9602 Acute respiratory failure with hypercapnia: Secondary | ICD-10-CM | POA: Diagnosis not present

## 2021-07-05 DIAGNOSIS — J9601 Acute respiratory failure with hypoxia: Secondary | ICD-10-CM | POA: Diagnosis not present

## 2021-07-06 DIAGNOSIS — J9601 Acute respiratory failure with hypoxia: Secondary | ICD-10-CM | POA: Diagnosis not present

## 2021-07-06 DIAGNOSIS — G4733 Obstructive sleep apnea (adult) (pediatric): Secondary | ICD-10-CM | POA: Diagnosis not present

## 2021-07-06 DIAGNOSIS — J9602 Acute respiratory failure with hypercapnia: Secondary | ICD-10-CM | POA: Diagnosis not present

## 2021-07-06 DIAGNOSIS — I5033 Acute on chronic diastolic (congestive) heart failure: Secondary | ICD-10-CM | POA: Diagnosis not present

## 2021-08-05 DIAGNOSIS — J9601 Acute respiratory failure with hypoxia: Secondary | ICD-10-CM | POA: Diagnosis not present

## 2021-08-05 DIAGNOSIS — G4733 Obstructive sleep apnea (adult) (pediatric): Secondary | ICD-10-CM | POA: Diagnosis not present

## 2021-08-05 DIAGNOSIS — J9602 Acute respiratory failure with hypercapnia: Secondary | ICD-10-CM | POA: Diagnosis not present

## 2021-08-05 DIAGNOSIS — I5033 Acute on chronic diastolic (congestive) heart failure: Secondary | ICD-10-CM | POA: Diagnosis not present

## 2021-08-06 DIAGNOSIS — J9602 Acute respiratory failure with hypercapnia: Secondary | ICD-10-CM | POA: Diagnosis not present

## 2021-08-06 DIAGNOSIS — I5033 Acute on chronic diastolic (congestive) heart failure: Secondary | ICD-10-CM | POA: Diagnosis not present

## 2021-08-06 DIAGNOSIS — J9601 Acute respiratory failure with hypoxia: Secondary | ICD-10-CM | POA: Diagnosis not present

## 2021-08-06 DIAGNOSIS — G4733 Obstructive sleep apnea (adult) (pediatric): Secondary | ICD-10-CM | POA: Diagnosis not present

## 2021-08-21 DIAGNOSIS — G4733 Obstructive sleep apnea (adult) (pediatric): Secondary | ICD-10-CM | POA: Diagnosis not present

## 2021-08-26 IMAGING — CT CT ANGIO CHEST
2 of 7 series · 18 of 46 positions shown · IV contrast (APPLIED)
Comparison: May 25, 2017.

CLINICAL DATA: Positive D-dimer.

EXAM:
CT ANGIOGRAPHY CHEST WITH CONTRAST
TECHNIQUE: Multidetector CT imaging of the chest was performed using the
standard protocol during bolus administration of intravenous
contrast. Multiplanar CT image reconstructions and MIPs were
obtained to evaluate the vascular anatomy.
CONTRAST:  80mL OMNIPAQUE IOHEXOL 350 MG/ML SOLN

[Series 8: thins · axial · 0.69mm/px · z∈[+921,+1198]mm · 15 of 447 slices shown]
[im 25/447  lung]
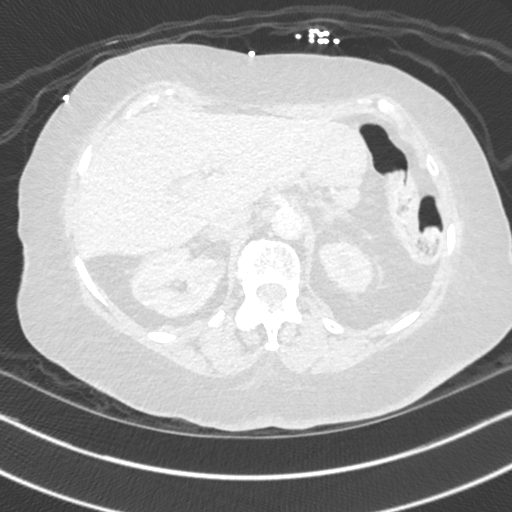
[im 50/447  soft-tissue]
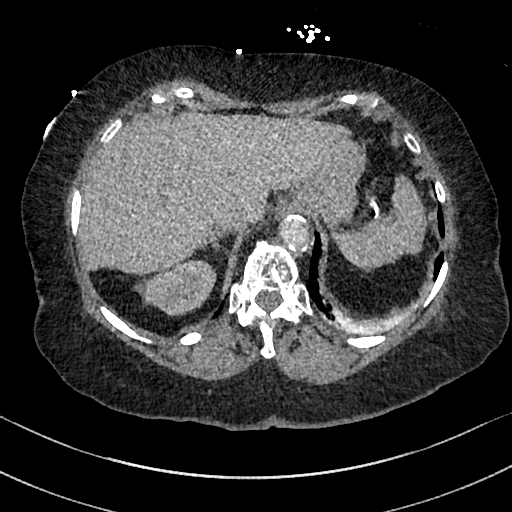
[im 75/447  lung]
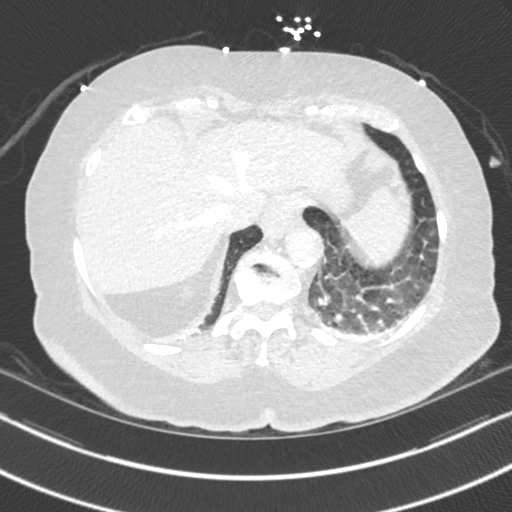
[im 100/447  soft-tissue]
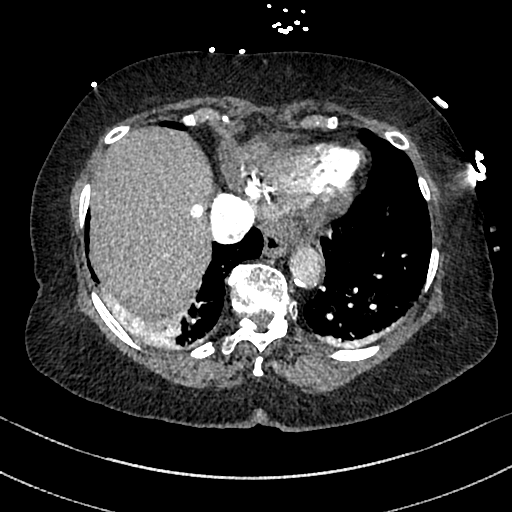
[im 149/447  lung]
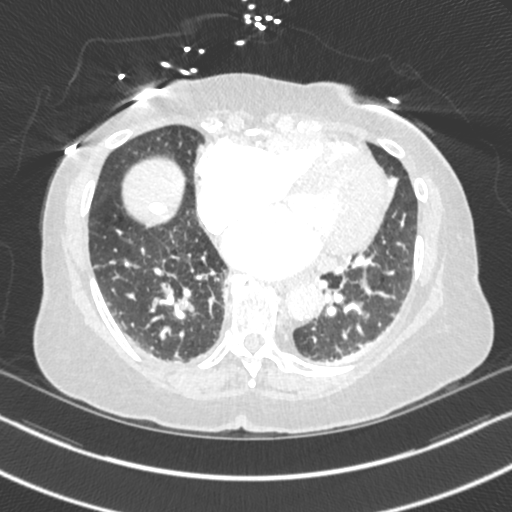
[im 174/447  soft-tissue]
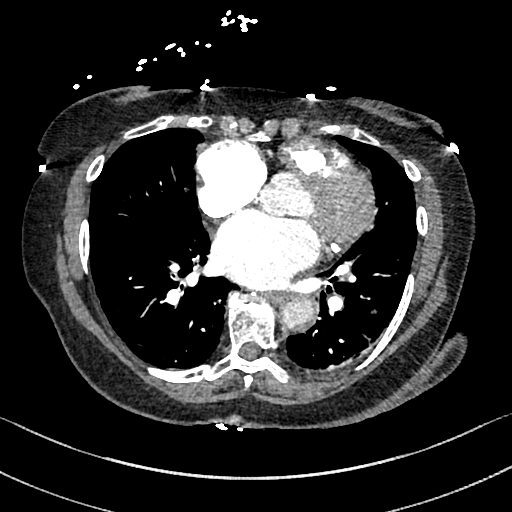
[im 199/447  lung]
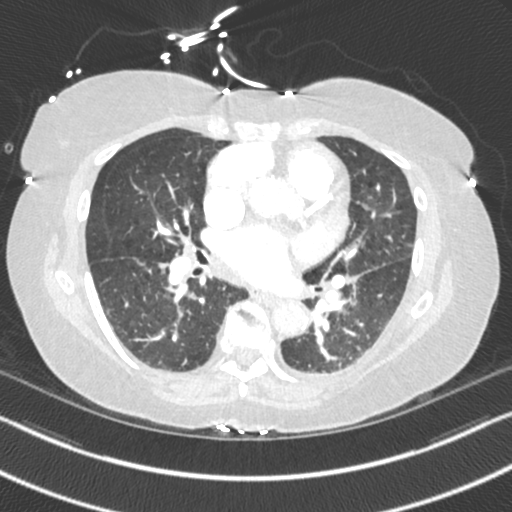
[im 224/447  soft-tissue]
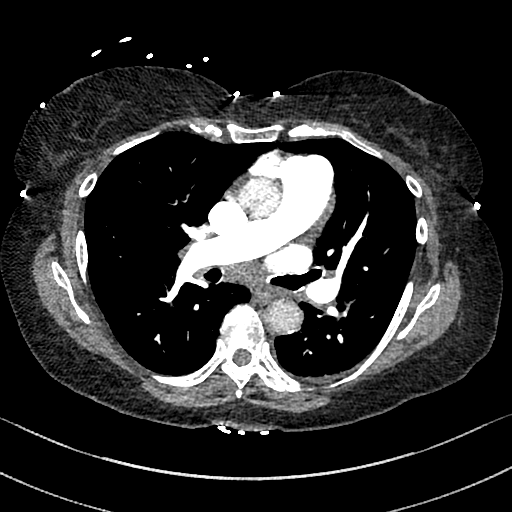
[im 248/447  lung]
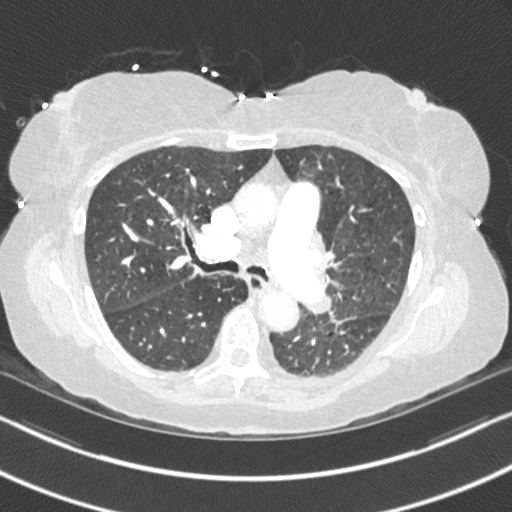
[im 273/447  soft-tissue]
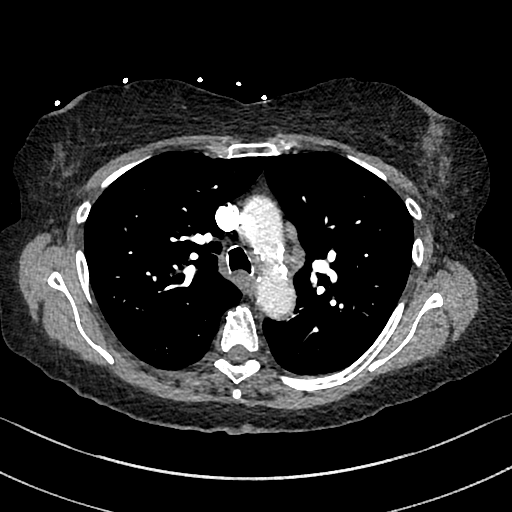
[im 298/447  lung]
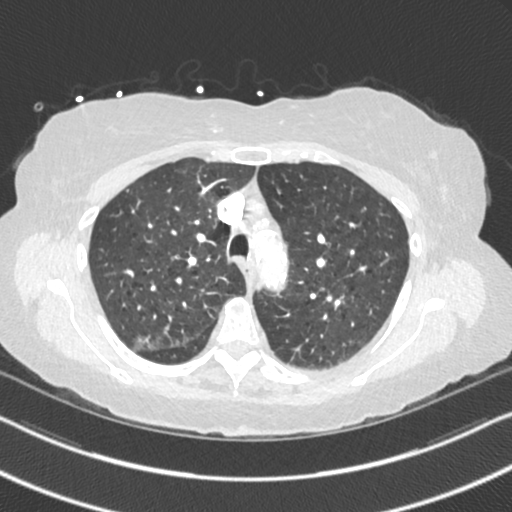
[im 347/447  soft-tissue]
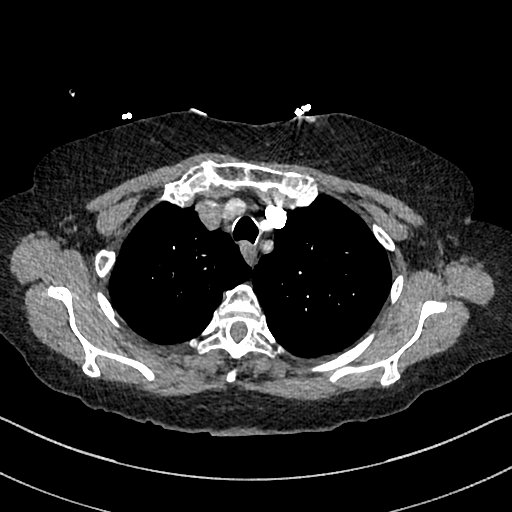
[im 372/447  lung]
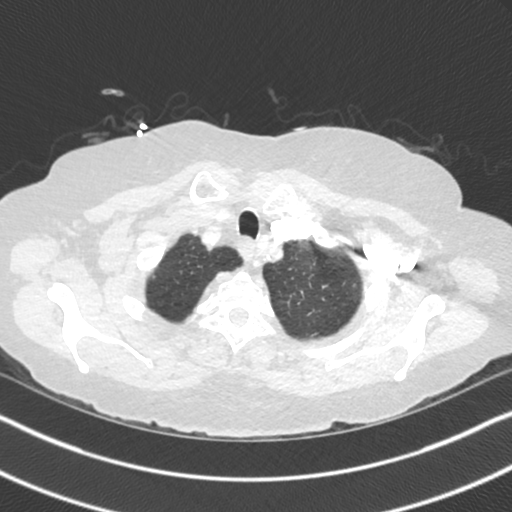
[im 397/447  soft-tissue]
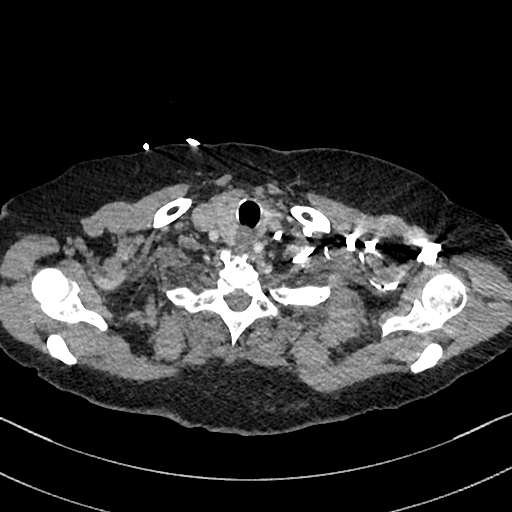
[im 422/447  lung]
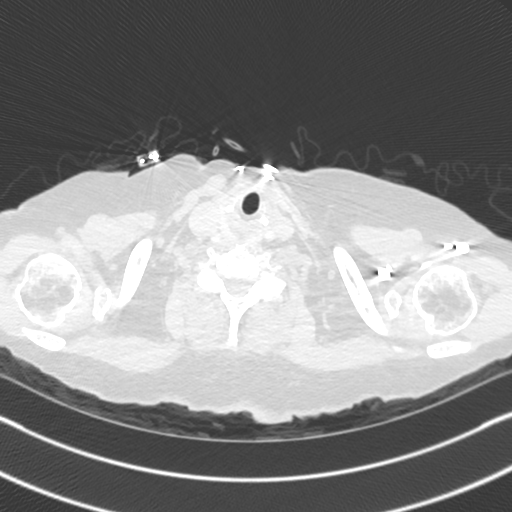

[Series 9: cor · coronal · 0.69mm/px · 3 of 135 slices shown]
[im 34/135  soft-tissue]
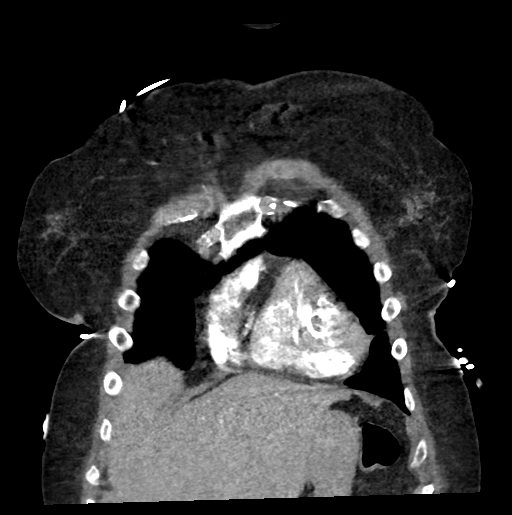
[im 68/135  soft-tissue]
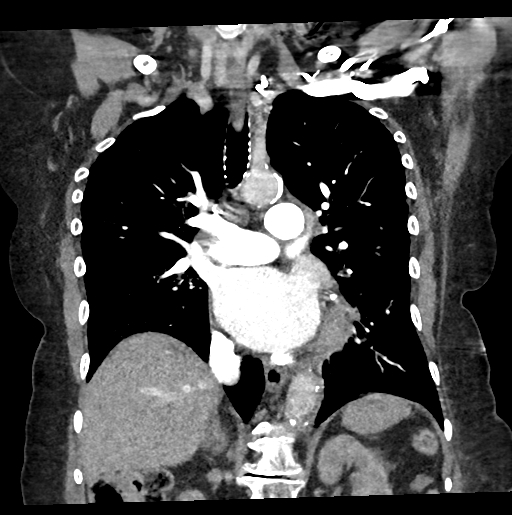
[im 101/135  soft-tissue]
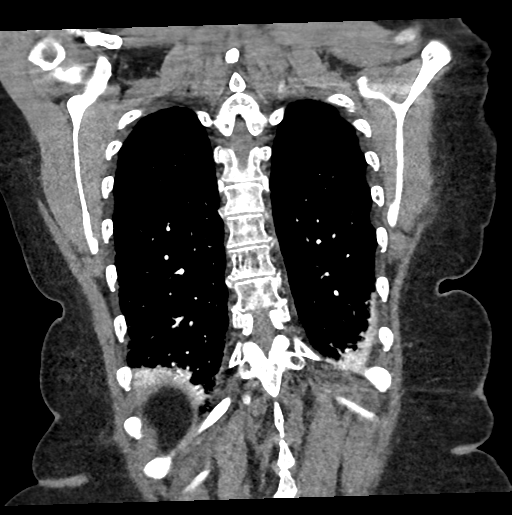

[18 of 46 positions shown; findings below may reference images not displayed]

FINDINGS: Cardiovascular: Satisfactory opacification of the pulmonary arteries
to the segmental level. No evidence of pulmonary embolism. Mild
cardiomegaly is noted. No pericardial effusion. Atherosclerosis of
thoracic aorta is noted without aneurysm formation. Coronary artery
calcifications are noted with probable stent seen in the left
anterior descending artery.

Mediastinum/Nodes: Small sliding-type hiatal hernia is noted.
Thyroid gland is unremarkable. 1.4 cm subcarinal adenopathy is
noted. 13 mm right hilar adenopathy is noted. 10 mm aortopulmonary
window adenopathy is noted. This may be inflammatory in etiology,
but neoplasm cannot be excluded.

Lungs/Pleura: No pneumothorax or pleural effusion is noted.
Emphysematous disease is noted in the upper lobes bilaterally. Mild
bilateral posterior basilar subsegmental atelectasis is noted.

Upper Abdomen: No acute abnormality.

Musculoskeletal: No chest wall abnormality. No acute or significant
osseous findings.

Review of the MIP images confirms the above findings.
IMPRESSION: 1. No definite evidence of pulmonary embolus.
2. Coronary artery calcifications are noted with probable stent seen
in the left anterior descending artery.
3. Small sliding-type hiatal hernia.
4. Mediastinal adenopathy is noted which may be inflammatory in
etiology, but neoplasm cannot be excluded. Follow-up CT scan in 3
months is recommended to ensure stability or resolution.
5. Emphysema and aortic atherosclerosis.

Aortic Atherosclerosis (0HKKI-S4T.T) and Emphysema (0HKKI-QMZ.P).

## 2021-09-02 ENCOUNTER — Telehealth: Payer: Self-pay | Admitting: Pharmacist

## 2021-09-02 NOTE — Progress Notes (Signed)
Chronic Care Management Pharmacy Assistant   Name: [name]  MRN: [id number] DOB: [birth year]   Reason for Encounter: Disease State   Conditions to be addressed/monitored: HTN   Recent office visits:  None ID  Recent consult visits:  09/14/21 Philemon Kingdom, MD-Internal Medicine (Poorly controlled type 2 diabetes mellitus with circulatory disorder) med changes:start Ozempic 0.25 mg weekly in a.m. (for example on Sunday morning) x 4 weeks, then increase to 0.5 mg weekly in a.m. if no nausea or hypoglycemia.  05/28/21 Renato Shin, MD-Endocrinology (Type 2 diabetes mellitus with stage 3a chronic kidney disease, with long-term current use of insulin)  Please change the insulin to 32 units with breakfast, and 6 with supper.   05/11/21 Parrett, Fonnie Mu, NP-Pulmonary (Chronic respiratory failure with hypoxia)   Hospital visits:  None in previous 6 months  Medications: Outpatient Encounter Medications as of 09/02/2021  Medication Sig Note   acetaminophen (TYLENOL) 325 MG tablet Take 2 tablets (650 mg total) by mouth every 6 (six) hours as needed for mild pain or headache.    amLODipine (NORVASC) 2.5 MG tablet Take 1 tablet (2.5 mg total) by mouth daily. May take extra 2.5 mg for systolic BP > 99991111    carvedilol (COREG) 6.25 MG tablet TAKE 1 TABLET BY MOUTH  TWICE DAILY WITH MEALS    ELIQUIS 5 MG TABS tablet TAKE 1 TABLET BY MOUTH  TWICE DAILY    glucose blood (ONETOUCH VERIO) test strip 1 each by Other route 2 (two) times daily. And lancets 2/day    Insulin Lispro Prot & Lispro (HUMALOG MIX 75/25 KWIKPEN) (75-25) 100 UNIT/ML Kwikpen 32 units with breakfast and 6 units with dinner.    Lancets (ONETOUCH DELICA PLUS 123XX123) MISC USE TO MONITOR GLUCOSE  LEVELS TWICE DAILY    Multiple Vitamins-Minerals (CENTRUM SILVER ULTRA WOMENS PO) Take 1 tablet by mouth daily.    nitroGLYCERIN (NITROSTAT) 0.4 MG SL tablet Place 1 tablet (0.4 mg total) under the tongue every 5 (five) minutes  as needed for chest pain. Reported on 01/28/2016 03/10/2021: 03/10/21: Reports has not needed recently   NOVOFINE PLUS PEN NEEDLE 32G X 4 MM MISC SMARTSIG:1 Each SUB-Q Daily    OXYGEN Inhale 3 % into the lungs continuous.    potassium chloride SA (KLOR-CON) 20 MEQ tablet TAKE 2 TABLETS BY MOUTH  DAILY    torsemide (DEMADEX) 20 MG tablet TAKE 2 TABLETS BY MOUTH  TWICE DAILY    No facility-administered encounter medications on file as of 09/02/2021.    Recent Office Vitals: BP Readings from Last 3 Encounters:  06/25/21 (!) 168/62  06/18/21 (!) 158/72  05/28/21 (!) 200/82   Pulse Readings from Last 3 Encounters:  06/25/21 70  06/18/21 73  05/28/21 90    Wt Readings from Last 3 Encounters:  06/18/21 168 lb 3.2 oz (76.3 kg)  05/28/21 171 lb (77.6 kg)  05/11/21 168 lb 3.2 oz (76.3 kg)     Kidney Function Lab Results  Component Value Date/Time   CREATININE 0.84 02/05/2021 02:15 AM   CREATININE 0.79 02/04/2021 02:55 AM   GFR 53.03 (L) 02/20/2020 12:39 PM   GFRNONAA >60 02/05/2021 02:15 AM   GFRAA 46 (L) 06/09/2020 11:33 AM    BMP Latest Ref Rng & Units 02/05/2021 02/04/2021 02/04/2021  Glucose 70 - 99 mg/dL 183(H) - -  BUN 8 - 23 mg/dL 16 - -  Creatinine 0.44 - 1.00 mg/dL 0.84 - -  Sodium 135 - 145 mmol/L 139 141  144  Potassium 3.5 - 5.1 mmol/L 3.7 3.6 3.3(L)  Chloride 98 - 111 mmol/L 92(L) - -  CO2 22 - 32 mmol/L 37(H) - -  Calcium 8.9 - 10.3 mg/dL 9.7 - -     Contacted patient on 09/02/21, 09/18/21 and 09/22/21 to discuss hypertension disease state  Current antihypertensive regimen:  Amlodipine 2.5 mg 1 tab daily Carvedilol 6.25 mg twice a day Furosemide 80 mg twice a day Potassium 20 mEq twice a day    What recent interventions/DTPs have been made by any provider to improve Blood Pressure control since last CPP Visit: None ID  Any recent hospitalizations or ED visits since last visit with CPP? No   Adherence Review: Is the patient currently on ACE/ARB medication? No Does  the patient have >5 day gap between last estimated fill dates? No   Star Rating Drugs:  None ID   Care Gaps: Last annual wellness visit:None ID   Endocrinology appointment on 09/08/21    [facility] Pharmacist Assistant [phone]   Time spent:26

## 2021-09-03 ENCOUNTER — Other Ambulatory Visit: Payer: Self-pay

## 2021-09-03 ENCOUNTER — Ambulatory Visit (INDEPENDENT_AMBULATORY_CARE_PROVIDER_SITE_OTHER): Payer: Medicare Other

## 2021-09-03 DIAGNOSIS — Z0000 Encounter for general adult medical examination without abnormal findings: Secondary | ICD-10-CM

## 2021-09-03 NOTE — Patient Instructions (Signed)
[name] , Thank you for taking time to come for your Medicare Wellness Visit. I appreciate your ongoing commitment to your health goals. Please review the following plan we discussed and let me know if I can assist you in the future.   Screening recommendations/referrals: Colonoscopy: Not a candidate for screening due to age 85: Not a candidate for screening due to age Bone Density: Not a candidate for screening due to age Recommended yearly ophthalmology/optometry visit for glaucoma screening and checkup Recommended yearly dental visit for hygiene and checkup  Vaccinations: Influenza vaccine: due Fall 2022 Pneumococcal vaccine: 05/31/2015, 07/31/2018 Tdap vaccine: 06/15/2010; due every 10 years (overdue) Shingles vaccine: never done   Covid-19: 02/08/2020, 03/10/2020, 11/03/2020  Advanced directives: Yes; documents on file  Conditions/risks identified: Yes; Client understands the importance of follow-up with providers by attending scheduled visits and discussed goals to eat healthier, increase physical activity, exercise the brain, socialize more, get enough sleep and make time for laughter.  Next appointment: Please schedule your next Medicare Wellness Visit with your Nurse Health Advisor in 1 year by calling [phone].   Preventive Care 45 Years and Older, Female Preventive care refers to lifestyle choices and visits with your health care provider that can promote health and wellness. What does preventive care include? A yearly physical exam. This is also called an annual well check. Dental exams once or twice a year. Routine eye exams. Ask your health care provider how often you should have your eyes checked. Personal lifestyle choices, including: Daily care of your teeth and gums. Regular physical activity. Eating a healthy diet. Avoiding tobacco and drug use. Limiting alcohol use. Practicing safe sex. Taking low-dose aspirin every day. Taking vitamin and mineral  supplements as recommended by your health care provider. What happens during an annual well check? The services and screenings done by your health care provider during your annual well check will depend on your age, overall health, lifestyle risk factors, and family history of disease. Counseling  Your health care provider may ask you questions about your: Alcohol use. Tobacco use. Drug use. Emotional well-being. Home and relationship well-being. Sexual activity. Eating habits. History of falls. Memory and ability to understand (cognition). Work and work Statistician. Reproductive health. Screening  You may have the following tests or measurements: Height, weight, and BMI. Blood pressure. Lipid and cholesterol levels. These may be checked every 5 years, or more frequently if you are over 85 years old. Skin check. Lung cancer screening. You may have this screening every year starting at age 13 if you have a 30-pack-year history of smoking and currently smoke or have quit within the past 15 years. Fecal occult blood test (FOBT) of the stool. You may have this test every year starting at age 49. Flexible sigmoidoscopy or colonoscopy. You may have a sigmoidoscopy every 5 years or a colonoscopy every 10 years starting at age 85. Hepatitis C blood test. Hepatitis B blood test. Sexually transmitted disease (STD) testing. Diabetes screening. This is done by checking your blood sugar (glucose) after you have not eaten for a while (fasting). You may have this done every 1-3 years. Bone density scan. This is done to screen for osteoporosis. You may have this done starting at age 85. Mammogram. This may be done every 1-2 years. Talk to your health care provider about how often you should have regular mammograms. Talk with your health care provider about your test results, treatment options, and if necessary, the need for more tests. Vaccines  Your  health care provider may recommend certain  vaccines, such as: Influenza vaccine. This is recommended every year. Tetanus, diphtheria, and acellular pertussis (Tdap, Td) vaccine. You may need a Td booster every 10 years. Zoster vaccine. You may need this after age 85. Pneumococcal 13-valent conjugate (PCV13) vaccine. One dose is recommended after age 85. Pneumococcal polysaccharide (PPSV23) vaccine. One dose is recommended after age 85. Talk to your health care provider about which screenings and vaccines you need and how often you need them. This information is not intended to replace advice given to you by your health care provider. Make sure you discuss any questions you have with your health care provider. Document Released: 01/09/2016 Document Revised: 09/01/2016 Document Reviewed: 10/14/2015 Elsevier Interactive Patient Education  2017 [employer] Prevention in the Home Falls can cause injuries. They can happen to people of all ages. There are many things you can do to make your home safe and to help prevent falls. What can I do on the outside of my home? Regularly fix the edges of walkways and driveways and fix any cracks. Remove anything that might make you trip as you walk through a door, such as a raised step or threshold. Trim any bushes or trees on the path to your home. Use bright outdoor lighting. Clear any walking paths of anything that might make someone trip, such as rocks or tools. Regularly check to see if handrails are loose or broken. Make sure that both sides of any steps have handrails. Any raised decks and porches should have guardrails on the edges. Have any leaves, snow, or ice cleared regularly. Use sand or salt on walking paths during winter. Clean up any spills in your garage right away. This includes oil or grease spills. What can I do in the bathroom? Use night lights. Install grab bars by the toilet and in the tub and shower. Do not use towel bars as grab bars. Use non-skid mats or decals in  the tub or shower. If you need to sit down in the shower, use a plastic, non-slip stool. Keep the floor dry. Clean up any water that spills on the floor as soon as it happens. Remove soap buildup in the tub or shower regularly. Attach bath mats securely with double-sided non-slip rug tape. Do not have throw rugs and other things on the floor that can make you trip. What can I do in the bedroom? Use night lights. Make sure that you have a light by your bed that is easy to reach. Do not use any sheets or blankets that are too big for your bed. They should not hang down onto the floor. Have a firm chair that has side arms. You can use this for support while you get dressed. Do not have throw rugs and other things on the floor that can make you trip. What can I do in the kitchen? Clean up any spills right away. Avoid walking on wet floors. Keep items that you use a lot in easy-to-reach places. If you need to reach something above you, use a strong step stool that has a grab bar. Keep electrical cords out of the way. Do not use floor polish or wax that makes floors slippery. If you must use wax, use non-skid floor wax. Do not have throw rugs and other things on the floor that can make you trip. What can I do with my stairs? Do not leave any items on the stairs. Make sure that there are handrails  on both sides of the stairs and use them. Fix handrails that are broken or loose. Make sure that handrails are as long as the stairways. Check any carpeting to make sure that it is firmly attached to the stairs. Fix any carpet that is loose or worn. Avoid having throw rugs at the top or bottom of the stairs. If you do have throw rugs, attach them to the floor with carpet tape. Make sure that you have a light switch at the top of the stairs and the bottom of the stairs. If you do not have them, ask someone to add them for you. What else can I do to help prevent falls? Wear shoes that: Do not have high  heels. Have rubber bottoms. Are comfortable and fit you well. Are closed at the toe. Do not wear sandals. If you use a stepladder: Make sure that it is fully opened. Do not climb a closed stepladder. Make sure that both sides of the stepladder are locked into place. Ask someone to hold it for you, if possible. Clearly mark and make sure that you can see: Any grab bars or handrails. First and last steps. Where the edge of each step is. Use tools that help you move around (mobility aids) if they are needed. These include: Canes. Walkers. Scooters. Crutches. Turn on the lights when you go into a dark area. Replace any light bulbs as soon as they burn out. Set up your furniture so you have a clear path. Avoid moving your furniture around. If any of your floors are uneven, fix them. If there are any pets around you, be aware of where they are. Review your medicines with your doctor. Some medicines can make you feel dizzy. This can increase your chance of falling. Ask your doctor what other things that you can do to help prevent falls. This information is not intended to replace advice given to you by your health care provider. Make sure you discuss any questions you have with your health care provider. Document Released: 10/09/2009 Document Revised: 05/20/2016 Document Reviewed: 01/17/2015 Elsevier Interactive Patient Education  2017 [employer].

## 2021-09-03 NOTE — Progress Notes (Signed)
I connected with [name] today by telephone and verified that I am speaking with the correct person using two identifiers. Location patient: home Location provider: work Persons participating in the virtual visit: patient, provider.   I discussed the limitations, risks, security and privacy concerns of performing an evaluation and management service by telephone and the availability of in person appointments. I also discussed with the patient that there may be a patient responsible charge related to this service. The patient expressed understanding and verbally consented to this telephonic visit.    Interactive audio and video telecommunications were attempted between this provider and patient, however failed, due to patient having technical difficulties OR patient did not have access to video capability.  We continued and completed visit with audio only.  Some vital signs may be absent or patient reported.   Time Spent with patient on telephone encounter: 30 minutes  Subjective:   [name] is a 87 y.o. female who presents for Medicare Annual (Subsequent) preventive examination.  Review of Systems     Cardiac Risk Factors include: advanced age (>54mn, >>28women);diabetes mellitus;family history of premature cardiovascular disease;hypertension     Objective:    There were no vitals filed for this visit. There is no height or weight on file to calculate BMI.  Advanced Directives 09/03/2021 03/10/2021 01/31/2021 05/15/2020 05/15/2020 04/11/2020 04/02/2020  Does Patient Have a Medical Advance Directive? Yes Yes No Yes No Yes Yes  Type of AParamedicof AAvillaLiving will HPicture RocksLiving will;Out of facility DNR (pink MOST or yellow form) - HFort WashakieLiving will - HBroganLiving will HGloria Glens ParkLiving will  Does patient want to make changes to medical advance directive? No -  Patient declined No - Patient declined - No - Patient declined - - No - Patient declined  Copy of HPhillipsin Chart? Yes - validated most recent copy scanned in chart (See row information) - - - - - -  Would patient like information on creating a medical advance directive? - - No - Patient declined - No - Patient declined - -    Current Medications (verified) Outpatient Encounter Medications as of 09/03/2021  Medication Sig   acetaminophen (TYLENOL) 325 MG tablet Take 2 tablets (650 mg total) by mouth every 6 (six) hours as needed for mild pain or headache.   amLODipine (NORVASC) 2.5 MG tablet Take 1 tablet (2.5 mg total) by mouth daily. May take extra 2.5 mg for systolic BP > [id number]  carvedilol (COREG) 6.25 MG tablet TAKE 1 TABLET BY MOUTH  TWICE DAILY WITH MEALS   ELIQUIS 5 MG TABS tablet TAKE 1 TABLET BY MOUTH  TWICE DAILY   glucose blood (ONETOUCH VERIO) test strip 1 each by Other route 2 (two) times daily. And lancets 2/day   Insulin Lispro Prot & Lispro (HUMALOG MIX 75/25 KWIKPEN) (75-25) 100 UNIT/ML Kwikpen 32 units with breakfast and 6 units with dinner.   Lancets (ONETOUCH DELICA PLUS [id number] MISC USE TO MONITOR GLUCOSE  LEVELS TWICE DAILY   Multiple Vitamins-Minerals (CENTRUM SILVER ULTRA WOMENS PO) Take 1 tablet by mouth daily.   nitroGLYCERIN (NITROSTAT) 0.4 MG SL tablet Place 1 tablet (0.4 mg total) under the tongue every 5 (five) minutes as needed for chest pain. Reported on 01/28/2016   NOVOFINE PLUS PEN NEEDLE 32G X 4 MM MISC SMARTSIG:1 Each SUB-Q Daily   OXYGEN Inhale 3 % into the lungs continuous.  potassium chloride SA (KLOR-CON) 20 MEQ tablet TAKE 2 TABLETS BY MOUTH  DAILY   torsemide (DEMADEX) 20 MG tablet TAKE 2 TABLETS BY MOUTH  TWICE DAILY   No facility-administered encounter medications on file as of 09/03/2021.    Allergies (verified) Levaquin [levofloxacin], Lobster [shellfish allergy], Penicillins, Valsartan, Codeine, Lipitor [atorvastatin],  Oxycodone, Statins, Tramadol, Amlodipine besylate, Crestor [rosuvastatin calcium], and Tramadol hcl   History: Past Medical History:  Diagnosis Date   Anemia    iron defficiency   Anginal pain (HCC)    Arthritis    "knees, feet, hands; joints" (05/27/2015)   Asthma    Atrial fibrillation or flutter    s/p RFCA 7/08;   s/p DCCV in past;   previously on amiodarone;  amio stopped due to lung toxicity   CAD (coronary artery disease)    s/p NSTEMI tx with BMS to OM1 3/08;  cath 3/08: pOM 99% tx with PCI, pLAD 20%, ? mod stenosis at the AM   CAP (community acquired pneumonia) 05/24/2015   CHF (congestive heart failure) ([place])    Chronic diastolic heart failure ([place])    echo 11/11:  EF 55-60%, severe LVH, mod LAE, mild MR, mildly increased PASP   COPD (chronic obstructive pulmonary disease) (HCC)    Degenerative joint disease    Dystrophy, corneal stromal    Heart murmur    HLD (hyperlipidemia)    HTN (hypertension)    essential nos   Hypopotassemia    PMH of   Muscle pain    Myocardial infarction ([place]) 2008   OSA on CPAP    Osteoporosis    Pneumonia 05/27/2015   Protein calorie malnutrition (HCC)    Rash and nonspecific skin eruption    both arms,awaiting bio   dr Delman Cheadle   Seasonal allergies    Shortness of breath dyspnea    Type II diabetes mellitus ([place])    Dr Loanne Drilling   Past Surgical History:  Procedure Laterality Date   A FLUTTER ABLATION     Dr Lovena Le   ABDOMINAL AORTOGRAM W/LOWER EXTREMITY Bilateral 04/11/2020   Procedure: ABDOMINAL AORTOGRAM W/LOWER EXTREMITY;  Surgeon: Angelia Mould, MD;  Location: [facility];  Service: Cardiovascular;  Laterality: Bilateral;   CATARACT EXTRACTION W/ INTRAOCULAR LENS  IMPLANT, BILATERAL Bilateral    CHOLECYSTECTOMY     COLONOSCOPY  6/07   2 polyps Dr. Kinnie Feil in [place]   colonoscopy with polypectomy      X 2; [facility]   CORNEAL TRANSPLANT Bilateral    CORONARY ANGIOPLASTY WITH STENT PLACEMENT  02/2007   BMS w/Dr Lovena Le    CORONARY ATHERECTOMY N/A 05/16/2020   Procedure: CORONARY ATHERECTOMY;  Surgeon: Martinique, Peter M, MD;  Location: [facility];  Service: Cardiovascular;  Laterality: N/A;   CORONARY BALLOON ANGIOPLASTY N/A 05/16/2020   Procedure: CORONARY BALLOON ANGIOPLASTY;  Surgeon: Martinique, Peter M, MD;  Location: [facility];  Service: Cardiovascular;  Laterality: N/A;   CORONARY STENT INTERVENTION N/A 05/16/2020   Procedure: CORONARY STENT INTERVENTION;  Surgeon: Martinique, Peter M, MD;  Location: [facility];  Service: Cardiovascular;  Laterality: N/A;   EYE SURGERY     gravid 2 para 2     INTRAVASCULAR ULTRASOUND/IVUS N/A 05/16/2020   Procedure: Intravascular Ultrasound/IVUS;  Surgeon: Martinique, Peter M, MD;  Location: [facility];  Service: Cardiovascular;  Laterality: N/A;   KNEE CARTILAGE SURGERY Right    LEFT AND RIGHT HEART CATHETERIZATION WITH CORONARY ANGIOGRAM N/A 05/07/2014  Procedure: LEFT AND RIGHT HEART CATHETERIZATION WITH CORONARY ANGIOGRAM;  Surgeon: Peter M Martinique, MD;  Location: [facility];  Service: Cardiovascular;  Laterality: N/A;   PERIPHERAL VASCULAR INTERVENTION  04/11/2020   Procedure: PERIPHERAL VASCULAR INTERVENTION;  Surgeon: Angelia Mould, MD;  Location: [facility];  Service: Cardiovascular;;   RIGHT HEART CATH N/A 02/04/2021   Procedure: RIGHT HEART CATH;  Surgeon: Jolaine Artist, MD;  Location: [facility];  Service: Cardiovascular;  Laterality: N/A;   RIGHT/LEFT HEART CATH AND CORONARY ANGIOGRAPHY N/A 05/16/2020   Procedure: RIGHT/LEFT HEART CATH AND CORONARY ANGIOGRAPHY;  Surgeon: Jolaine Artist, MD;  Location: [facility];  Service: Cardiovascular;  Laterality: N/A;   TOTAL KNEE ARTHROPLASTY Right 06/28/2016   Procedure: TOTAL KNEE ARTHROPLASTY;  Surgeon: Frederik Pear, MD;  Location: [facility];  Service: Orthopedics;  Laterality: Right;   Family History  Problem Relation Age of Onset   Diabetes Mother    Hypertension  Mother    Transient ischemic attack Mother    Arthritis Mother    Heart attack Father 39   Arthritis Father    Hypertension Father    Breast cancer Maternal Aunt    Arthritis Maternal Aunt    Social History   Socioeconomic History   Marital status: Widowed    Spouse name: Not on file   Number of children: 2   Years of education: 16   Highest education level: Bachelor's degree (e.g., BA, AB, BS)  Occupational History   Occupation: Teacher/ Retired  Tobacco Use   Smoking status: Former    Packs/day: 0.25    Years: 18.00    Pack years: 4.50    Types: Cigarettes    Quit date: 12/27/1968    Years since quitting: 52.7   Smokeless tobacco: Never   Tobacco comments:    smoked [place], up to 1 pp week  Vaping Use   Vaping Use: Never used  Substance and Sexual Activity   Alcohol use: Yes    Comment: occ   Drug use: No   Sexual activity: Never  Other Topics Concern   Not on file  Social History Narrative   Teacher. Widowed. Rarely drinks cafeine.    Social Determinants of Health   Financial Resource Strain: Low Risk    Difficulty of Paying Living Expenses: Not hard at all  Food Insecurity: No Food Insecurity   Worried About Charity fundraiser in the Last Year: Never true   [place] in the Last Year: Never true  Transportation Needs: No Transportation Needs   Lack of Transportation (Medical): No   Lack of Transportation (Non-Medical): No  Physical Activity: Sufficiently Active   Days of Exercise per Week: 5 days   Minutes of Exercise per Session: 30 min  Stress: No Stress Concern Present   Feeling of Stress : Not at all  Social Connections: Moderately Integrated   Frequency of Communication with Friends and Family: More than three times a week   Frequency of Social Gatherings with Friends and Family: More than three times a week   Attends Religious Services: More than 4 times per year   Active Member of [employer] or Organizations: Yes   Attends Theatre manager Meetings: More than 4 times per year   Marital Status: Widowed    Tobacco Counseling Counseling given: Not Answered Tobacco comments: smoked [place], up to 1 pp week   Clinical Intake:  Pre-visit preparation completed: Yes  Pain : No/denies pain     Nutritional Risks: None Diabetes: Yes CBG done?: No Did pt. bring in CBG monitor from home?: No  How often do you need to have someone help you when you read instructions, pamphlets, or other written materials from your doctor or pharmacy?: 1 - Never What is the last grade level you completed in school?: Bachelor's Degree  Diabetic? yes  Interpreter Needed?: No  Information entered by :: Lisette Abu, LPN   Activities of Daily Living In your present state of health, do you have any difficulty performing the following activities: 09/03/2021 01/31/2021  Hearing? N N  Vision? N N  Difficulty concentrating or making decisions? N N  Walking or climbing stairs? Y Y  Dressing or bathing? N N  Doing errands, shopping? N N  Preparing Food and eating ? N -  Using the Toilet? N -  In the past six months, have you accidently leaked urine? Y -  Do you have problems with loss of bowel control? N -  Managing your Medications? N -  Managing your Finances? N -  Housekeeping or managing your Housekeeping? Y -  Some recent data might be hidden    Patient Care Team: [name], MD as PCP - General (Internal Medicine) Debara Pickett Nadean Corwin, MD as PCP - Cardiology (Cardiology) Bensimhon, Shaune Pascal, MD as PCP - Advanced Heart Failure (Cardiology) Chesley Mires, MD as Consulting Physician (Pulmonary Disease) Renato Shin, MD as Consulting Physician (Endocrinology) Bensimhon, Shaune Pascal, MD as Consulting Physician (Cardiology) Charlton Haws, [facility] as Pharmacist (Pharmacist) Knox Royalty, RN as Case Manager  Indicate any recent Medical Services you may have received from other than [facility] providers in the past year  (date may be approximate).     Assessment:   This is a routine wellness examination for [place].  Hearing/Vision screen Hearing Screening - Comments:: Patient denied any hearing difficulty.  No hearing aids. Vision Screening - Comments:: Patient wears eyeglasses for reading and distance.  Annual eye exam done by Glory Rosebush, OD.  Dietary issues and exercise activities discussed: Current Exercise Habits: Home exercise routine, Type of exercise: walking;stretching;treadmill;strength training/weights, Time (Minutes): 30, Frequency (Times/Week): 5, Weekly Exercise (Minutes/Week): 150, Intensity: Moderate, Exercise limited by: cardiac condition(s);respiratory conditions(s);orthopedic condition(s)   Goals Addressed               This Visit's Progress     Patient Stated (pt-stated)        My goal is to continue to maintain my health.      Depression Screen PHQ 2/9 Scores 09/03/2021 02/17/2021 09/19/2020 07/17/2020 10/12/2019 08/25/2018 07/31/2018  PHQ - 2 Score 0 1 0 0 1 0 0    Fall Risk Fall Risk  09/03/2021 02/17/2021 07/17/2020 10/12/2019 07/31/2018  Falls in the past year? 0 0 0 0 No  Number falls in past yr: 0 0 0 0 -  Injury with Fall? 0 0 0 1 -  Comment - N/A- no falls reported - - -  Risk for fall due to : No Fall Risks Medication side effect;Other (Comment) Impaired balance/gait;Orthopedic patient;Impaired mobility;Other (Comment) Impaired balance/gait Impaired balance/gait;Impaired mobility  Risk for fall due to: Comment - weak post hospital discharge; new home O2 Uses walker/cane. PAD/Right knee repalcement/bilateral foot neuropathy - -  Follow up Falls evaluation completed Falls prevention discussed Falls evaluation completed - -    FALL RISK PREVENTION PERTAINING TO THE HOME:  Any stairs in or around the home? Yes  If  so, are there any without handrails? No  Home free of loose throw rugs in walkways, pet beds, electrical cords, etc? Yes  Adequate lighting in your home to  reduce risk of falls? Yes   ASSISTIVE DEVICES UTILIZED TO PREVENT FALLS:  Life alert? Yes  Use of a cane, walker or w/c? Yes  Grab bars in the bathroom? Yes  Shower chair or bench in shower? Yes  Elevated toilet seat or a handicapped toilet? Yes   TIMED UP AND GO:  Was the test performed? No .  Length of time to ambulate 10 feet: n/a sec.  (Patient stated that her gait is fine with assistive devices)  Gait steady and fast with assistive device  Cognitive Function: MMSE - Mini Mental State Exam 10/12/2019 07/31/2018  Not completed: Refused -  Orientation to time - 5  Orientation to Place - 5  Registration - 3  Attention/ Calculation - 5  Recall - 2  Language- name 2 objects - 2  Language- repeat - 1  Language- follow 3 step command - 3  Language- read & follow direction - 1  Write a sentence - 1  Copy design - 1  Total score - 29        Immunizations Immunization History  Administered Date(s) Administered   Fluad Quad(high Dose 65+) 08/22/2019   Influenza Split 11/16/2011, 09/26/2012   Influenza Whole 12/27/2004, 10/21/2008, 10/20/2009, 11/11/2010   Influenza, High Dose Seasonal PF 10/14/2016, 10/10/2017, 09/28/2018   Influenza,inj,Quad PF,6+ Mos 09/24/2013, 12/16/2014, 09/19/2015   Moderna SARS-COV2 Booster Vaccination 11/03/2020   Moderna Sars-Covid-2 Vaccination 02/08/2020, 03/10/2020   PPD Test 05/31/2015, 07/07/2016   Pneumococcal Conjugate-13 07/31/2018   Pneumococcal Polysaccharide-23 12/16/2014   Pneumococcal-Unspecified 05/31/2015   Td 06/15/2010    TDAP status: Due, Education has been provided regarding the importance of this vaccine. Advised may receive this vaccine at local pharmacy or Health Dept. Aware to provide a copy of the vaccination record if obtained from local pharmacy or Health Dept. Verbalized acceptance and understanding.  Flu Vaccine status: Due, Education has been provided regarding the importance of this vaccine. Advised may receive this  vaccine at local pharmacy or Health Dept. Aware to provide a copy of the vaccination record if obtained from local pharmacy or Health Dept. Verbalized acceptance and understanding.  Pneumococcal vaccine status: Up to date  Covid-19 vaccine status: Completed vaccines  Qualifies for Shingles Vaccine? Yes   Zostavax completed No   Shingrix Completed?: No.    Education has been provided regarding the importance of this vaccine. Patient has been advised to call insurance company to determine out of pocket expense if they have not yet received this vaccine. Advised may also receive vaccine at local pharmacy or Health Dept. Verbalized acceptance and understanding.  Screening Tests Health Maintenance  Topic Date Due   Zoster Vaccines- Shingrix (1 of 2) Never done   DEXA SCAN  02/16/2019   OPHTHALMOLOGY EXAM  06/06/2019   URINE MICROALBUMIN  08/01/2019   TETANUS/TDAP  06/15/2020   COVID-19 Vaccine (4 - Booster for Moderna series) 01/26/2021   INFLUENZA VACCINE  07/27/2021   HEMOGLOBIN A1C  11/27/2021   FOOT EXAM  03/24/2022   PNA vac Low Risk Adult  Completed   HPV VACCINES  Aged Out    Health Maintenance  Health Maintenance Due  Topic Date Due   Zoster Vaccines- Shingrix (1 of 2) Never done   DEXA SCAN  02/16/2019   OPHTHALMOLOGY EXAM  06/06/2019   URINE MICROALBUMIN  08/01/2019   TETANUS/TDAP  06/15/2020   COVID-19 Vaccine (4 - Booster for Moderna series) 01/26/2021   INFLUENZA VACCINE  07/27/2021    Colorectal cancer screening: No longer required.   Mammogram status: No longer required due to age.  Bone density status: no longer required  Lung Cancer Screening: (Low Dose CT Chest recommended if Age 67-80 years, 30 pack-year currently smoking OR have quit w/in 15years.) does not qualify.   Lung Cancer Screening Referral: no  Additional Screening:  Hepatitis C Screening: does not qualify; Completed no  Vision Screening: Recommended annual ophthalmology exams for early  detection of glaucoma and other disorders of the eye. Is the patient up to date with their annual eye exam?  Yes  Who is the provider or what is the name of the office in which the patient attends annual eye exams? Glory Rosebush, OD with [facility] If pt is not established with a provider, would they like to be referred to a provider to establish care? No .   Dental Screening: Recommended annual dental exams for proper oral hygiene  Community Resource Referral / Chronic Care Management: CRR required this visit?  No   CCM required this visit?  No      Plan:     I have personally reviewed and noted the following in the patient's chart:   Medical and social history Use of alcohol, tobacco or illicit drugs  Current medications and supplements including opioid prescriptions.  Functional ability and status Nutritional status Physical activity Advanced directives List of other physicians Hospitalizations, surgeries, and ER visits in previous 12 months Vitals Screenings to include cognitive, depression, and falls Referrals and appointments  In addition, I have reviewed and discussed with patient certain preventive protocols, quality metrics, and best practice recommendations. A written personalized care plan for preventive services as well as general preventive health recommendations were provided to patient.     [name], LPN   [id number]   Nurse Notes:  Patient is cogitatively intact. There were no vitals filed for this visit. There is no height or weight on file to calculate BMI. Hearing Screening - Comments:: Patient denied any hearing difficulty.  No hearing aids. Vision Screening - Comments:: Patient wears eyeglasses for reading and distance.  Annual eye exam done by Glory Rosebush, OD.

## 2021-09-05 DIAGNOSIS — J9601 Acute respiratory failure with hypoxia: Secondary | ICD-10-CM | POA: Diagnosis not present

## 2021-09-05 DIAGNOSIS — I5033 Acute on chronic diastolic (congestive) heart failure: Secondary | ICD-10-CM | POA: Diagnosis not present

## 2021-09-05 DIAGNOSIS — J9602 Acute respiratory failure with hypercapnia: Secondary | ICD-10-CM | POA: Diagnosis not present

## 2021-09-05 DIAGNOSIS — G4733 Obstructive sleep apnea (adult) (pediatric): Secondary | ICD-10-CM | POA: Diagnosis not present

## 2021-09-06 DIAGNOSIS — J9601 Acute respiratory failure with hypoxia: Secondary | ICD-10-CM | POA: Diagnosis not present

## 2021-09-06 DIAGNOSIS — I5033 Acute on chronic diastolic (congestive) heart failure: Secondary | ICD-10-CM | POA: Diagnosis not present

## 2021-09-06 DIAGNOSIS — J9602 Acute respiratory failure with hypercapnia: Secondary | ICD-10-CM | POA: Diagnosis not present

## 2021-09-06 DIAGNOSIS — G4733 Obstructive sleep apnea (adult) (pediatric): Secondary | ICD-10-CM | POA: Diagnosis not present

## 2021-09-07 DIAGNOSIS — G4733 Obstructive sleep apnea (adult) (pediatric): Secondary | ICD-10-CM | POA: Diagnosis not present

## 2021-09-08 ENCOUNTER — Ambulatory Visit: Payer: Medicare Other | Admitting: Endocrinology

## 2021-09-14 ENCOUNTER — Ambulatory Visit (INDEPENDENT_AMBULATORY_CARE_PROVIDER_SITE_OTHER): Payer: Medicare Other | Admitting: Internal Medicine

## 2021-09-14 ENCOUNTER — Telehealth: Payer: Self-pay | Admitting: *Deleted

## 2021-09-14 ENCOUNTER — Encounter: Payer: Self-pay | Admitting: *Deleted

## 2021-09-14 ENCOUNTER — Other Ambulatory Visit: Payer: Self-pay

## 2021-09-14 ENCOUNTER — Telehealth: Payer: Medicare Other

## 2021-09-14 ENCOUNTER — Encounter: Payer: Self-pay | Admitting: Internal Medicine

## 2021-09-14 VITALS — BP 140/60 | HR 74 | Ht 63.0 in | Wt 172.2 lb

## 2021-09-14 DIAGNOSIS — E1122 Type 2 diabetes mellitus with diabetic chronic kidney disease: Secondary | ICD-10-CM | POA: Diagnosis not present

## 2021-09-14 DIAGNOSIS — E1159 Type 2 diabetes mellitus with other circulatory complications: Secondary | ICD-10-CM | POA: Diagnosis not present

## 2021-09-14 DIAGNOSIS — Z794 Long term (current) use of insulin: Secondary | ICD-10-CM | POA: Diagnosis not present

## 2021-09-14 DIAGNOSIS — E1165 Type 2 diabetes mellitus with hyperglycemia: Secondary | ICD-10-CM

## 2021-09-14 DIAGNOSIS — N1831 Chronic kidney disease, stage 3a: Secondary | ICD-10-CM | POA: Diagnosis not present

## 2021-09-14 LAB — POCT GLYCOSYLATED HEMOGLOBIN (HGB A1C): Hemoglobin A1C: 9 % — AB (ref 4.0–5.6)

## 2021-09-14 MED ORDER — OZEMPIC (0.25 OR 0.5 MG/DOSE) 2 MG/1.5ML ~~LOC~~ SOPN: 0.5000 mg | PEN_INJECTOR | SUBCUTANEOUS | 3 refills | Status: DC

## 2021-09-14 MED ORDER — LANCING DEVICE MISC: 0 refills | Status: AC

## 2021-09-14 NOTE — Patient Instructions (Addendum)
Please continue: - Novolin 70/30 28-32 units 30 min before breakfast   Please start Ozempic 0.25 mg weekly in a.m. (for example on Sunday morning) x 4 weeks, then increase to 0.5 mg weekly in a.m. if no nausea or hypoglycemia.  Please return in 1.5 months with your sugar log.   PATIENT INSTRUCTIONS FOR TYPE 2 DIABETES:  DIET AND EXERCISE Diet and exercise is an important part of diabetic treatment.  We recommended aerobic exercise in the form of brisk walking (working between 40-60% of maximal aerobic capacity, similar to brisk walking) for 150 minutes per week (such as 30 minutes five days per week) along with 3 times per week performing 'resistance' training (using various gauge rubber tubes with handles) 5-10 exercises involving the major muscle groups (upper body, lower body and core) performing 10-15 repetitions (or near fatigue) each exercise. Start at half the above goal but build slowly to reach the above goals. If limited by weight, joint pain, or disability, we recommend daily walking in a swimming pool with water up to waist to reduce pressure from joints while allow for adequate exercise.    BLOOD GLUCOSES Monitoring your blood glucoses is important for continued management of your diabetes. Please check your blood glucoses 2-4 times a day: fasting, before meals and at bedtime (you can rotate these measurements - e.g. one day check before the 3 meals, the next day check before 2 of the meals and before bedtime, etc.).   HYPOGLYCEMIA (low blood sugar) Hypoglycemia is usually a reaction to not eating, exercising, or taking too much insulin/ other diabetes drugs.  Symptoms include tremors, sweating, hunger, confusion, headache, etc. Treat IMMEDIATELY with 15 grams of Carbs: 4 glucose tablets  cup regular juice/soda 2 tablespoons raisins 4 teaspoons sugar 1 tablespoon honey Recheck blood glucose in 15 mins and repeat above if still symptomatic/blood glucose <100.  RECOMMENDATIONS  TO REDUCE YOUR RISK OF DIABETIC COMPLICATIONS: * Take your prescribed MEDICATION(S) * Follow a DIABETIC diet: Complex carbs, fiber rich foods, (monounsaturated and polyunsaturated) fats * AVOID saturated/trans fats, high fat foods, >2,300 mg salt per day. * EXERCISE at least 5 times a week for 30 minutes or preferably daily.  * DO NOT SMOKE OR DRINK more than 1 drink a day. * Check your FEET every day. Do not wear tightfitting shoes. Contact us if you develop an ulcer * See your EYE doctor once a year or more if needed * Get a FLU shot once a year * Get a PNEUMONIA vaccine once before and once after age 44 years  GOALS:  * Your Hemoglobin A1c of <7%  * fasting sugars need to be <130 * after meals sugars need to be <180 (2h after you start eating) * Your Systolic BP should be 229 or lower  * Your Diastolic BP should be 80 or lower  * Your HDL (Good Cholesterol) should be 40 or higher  * Your LDL (Bad Cholesterol) should be 100 or lower. * Your Triglycerides should be 150 or lower  * Your Urine microalbumin (kidney function) should be <30 * Your Body Mass Index should be 25 or lower    Please consider the following ways to cut down carbs and fat and increase fiber and micronutrients in your diet: - substitute whole grain for white bread or pasta - substitute brown rice for white rice - substitute 90-calorie flat bread pieces for slices of bread when possible - substitute sweet potatoes or yams for white potatoes - substitute humus for  margarine - substitute tofu for cheese when possible - substitute almond or rice milk for regular milk (would not drink soy milk daily due to concern for soy estrogen influence on breast cancer risk) - substitute dark chocolate for other sweets when possible - substitute water - can add lemon or orange slices for taste - for diet sodas (artificial sweeteners will trick your body that you can eat sweets without getting calories and will lead you to  overeating and weight gain in the long run) - do not skip breakfast or other meals (this will slow down the metabolism and will result in more weight gain over time)  - can try smoothies made from fruit and almond/rice milk in am instead of regular breakfast - can also try old-fashioned (not instant) oatmeal made with almond/rice milk in am - order the dressing on the side when eating salad at a restaurant (pour less than half of the dressing on the salad) - eat as little meat as possible - can try juicing, but should not forget that juicing will get rid of the fiber, so would alternate with eating raw veg./fruits or drinking smoothies - use as little oil as possible, even when using olive oil - can dress a salad with a mix of balsamic vinegar and lemon juice, for e.g. - use agave nectar, stevia sugar, or regular sugar rather than artificial sweateners - steam or broil/roast veggies  - snack on veggies/fruit/nuts (unsalted, preferably) when possible, rather than processed foods - reduce or eliminate aspartame in diet (it is in diet sodas, chewing gum, etc) Read the labels!  Read the following books: Dr. Alyssa Grove - Program for Reversing Diabetes

## 2021-09-14 NOTE — Telephone Encounter (Signed)
  Care Management   Follow Up Note   09/14/2021 Name: [name] MRN: [id number] DOB: [birth year]   Referred by: [name], MD Reason for referral : Chronic Care Management (CCM RN CM Follow Up call; DMII; CHF- unsuccessful outreach)  An unsuccessful telephone outreach was attempted today. The patient was referred to the case management team for assistance with care management and care coordination.   Follow Up Plan:  A HIPPA compliant phone message was left for the patient providing contact information and requesting a return call Will place request with scheduling care guide to contact patient to re-schedule today's missed CCM RN follow up telephone appointment, if I do not hear back form patient later today   [name], RN, BSN, [place] [phone]: direct office [phone]: mobile

## 2021-09-14 NOTE — Progress Notes (Signed)
Patient ID: [name], female   DOB: [birth year], 87 y.o.   MRN: [id number]  This visit occurred during the SARS-CoV-2 public health emergency.  Safety protocols were in place, including screening questions prior to the visit, additional usage of staff PPE, and extensive cleaning of exam room while observing appropriate contact time as indicated for disinfecting solutions.   HPI: [name] is a 87 y.o.-year-old female, self-referred (her daughter is also my patient), for management of DM2, dx in 1994, insulin-dependent, uncontrolled, with complications (CAD, s/p NSTEMI 2008; CHF; PAD; atrial fibrillation/atrial flutter; peripheral neuropathy; CKD stage IIIa).  She previously saw Dr. Loanne [name] but daughter wanted her to start seeing me.  Last office visit with Dr. Loanne [name]: 05/2021.  Mammogram daughter offers information about patient's diet, activity, and insulin doses.  Reviewed HbA1c: Lab Results  Component Value Date   HGBA1C 8.3 (A) 05/28/2021   HGBA1C 8.0 (A) 03/24/2021   HGBA1C 9.5 (A) 12/02/2020   HGBA1C 7.8 (A) 08/21/2020   HGBA1C 8.3 (H) 05/15/2020   HGBA1C 8.0 (A) 05/06/2020   HGBA1C 9.2 (H) 02/20/2020   HGBA1C 7.9 (A) 11/14/2019   HGBA1C 7.6 (A) 08/22/2019   HGBA1C 8.1 (A) 05/01/2019   Pt is on a regimen of: - Humalog 75/25 >> 70/30 Novolin (24$ at [facility], w/o Rx) 32 units before breakfast and 6 units before dinner  Pt checks her sugars 0-4x a day and they are: - am: 84-195, 254 - 2h after b'fast: n/c - before lunch: 229, 362 - 2h after lunch: n/c - before dinner: 130 - 2h after dinner: 74, 116 - bedtime: n/c >> 265 - nighttime: n/c Lowest sugar was 64; she has hypoglycemia awareness at 70.  Highest sugar was 359.  Glucometer: One Touch Verio IQ  Pt's meals are: - Breakfast: frozen banana, berries, oatmeal, protein powder But she has lunch and dinner in the cafeteria ([place]):   -+ History of CKD stage IIIa, last BUN/creatinine:  Lab  Results  Component Value Date   BUN 16 02/05/2021   BUN 16 02/04/2021   CREATININE 0.84 02/05/2021   CREATININE 0.79 02/04/2021  She is not on an ACE inhibitor/ARB  -+ HL; last set of lipids: Lab Results  Component Value Date   CHOL 164 05/16/2020   HDL 50 05/16/2020   LDLCALC 96 05/16/2020   LDLDIRECT 144.6 02/03/2010   TRIG 90 05/16/2020   CHOLHDL 3.3 05/16/2020  She had muscle weakness from statins.  - last eye exam was in 05/2018. No DR.   - no numbness and tingling in her feet.  Pt has FH of DM in mother.  She also has a history of HTN, osteoporosis, protein-calorie malnutrition, iron deficiency anemia.  No FH of MTC, no pancreatitis hx.  ROS: Constitutional: + weight gain, no weight loss, no fatigue, no subjective hyperthermia, no subjective hypothermia, no nocturia, + poor sleep Eyes: no blurry vision, no xerophthalmia ENT: no sore throat, no nodules palpated in neck, no dysphagia, no odynophagia, no hoarseness, no tinnitus, + hypoacusis Cardiovascular: no CP, + SOB, no palpitations, + leg swelling Respiratory: no cough, + SOB, no wheezing Gastrointestinal: no N, no V, no D, no C, no acid reflux Musculoskeletal: no muscle, + joint aches Skin: + Rash on lower legs, no hair loss Neurological: no tremors, no numbness or tingling/no dizziness/no HAs Psychiatric: no depression, no anxiety  Past Medical History:  Diagnosis Date   Anemia    iron defficiency   Anginal pain (HCC)  Arthritis    "knees, feet, hands; joints" (05/27/2015)   Asthma    Atrial fibrillation or flutter    s/p RFCA 7/08;   s/p DCCV in past;   previously on amiodarone;  amio stopped due to lung toxicity   CAD (coronary artery disease)    s/p NSTEMI tx with BMS to OM1 3/08;  cath 3/08: pOM 99% tx with PCI, pLAD 20%, ? mod stenosis at the AM   CAP (community acquired pneumonia) 05/24/2015   CHF (congestive heart failure) ([place])    Chronic diastolic heart failure ([place])    echo 11/11:  EF  55-60%, severe LVH, mod LAE, mild MR, mildly increased PASP   COPD (chronic obstructive pulmonary disease) (HCC)    Degenerative joint disease    Dystrophy, corneal stromal    Heart murmur    HLD (hyperlipidemia)    HTN (hypertension)    essential nos   Hypopotassemia    PMH of   Muscle pain    Myocardial infarction ([place]) 2008   OSA on CPAP    Osteoporosis    Pneumonia 05/27/2015   Protein calorie malnutrition (HCC)    Rash and nonspecific skin eruption    both arms,awaiting bio   dr [name]   Seasonal allergies    Shortness of breath dyspnea    Type II diabetes mellitus ([place])    Dr [name]   Past Surgical History:  Procedure Laterality Date   A FLUTTER ABLATION     Dr [name]   ABDOMINAL AORTOGRAM W/LOWER EXTREMITY Bilateral 04/11/2020   Procedure: ABDOMINAL AORTOGRAM W/LOWER EXTREMITY;  Surgeon: [name] Mould, MD;  Location: [facility];  Service: Cardiovascular;  Laterality: Bilateral;   CATARACT EXTRACTION W/ INTRAOCULAR LENS  IMPLANT, BILATERAL Bilateral    CHOLECYSTECTOMY     COLONOSCOPY  6/07   2 polyps Dr. Kinnie [name] in [place]   colonoscopy with polypectomy      X 2; [facility]   CORNEAL TRANSPLANT Bilateral    CORONARY ANGIOPLASTY WITH STENT PLACEMENT  02/2007   BMS w/Dr [name]   CORONARY ATHERECTOMY N/A 05/16/2020   Procedure: CORONARY ATHERECTOMY;  Surgeon: [name], [name] M, MD;  Location: [facility];  Service: Cardiovascular;  Laterality: N/A;   CORONARY BALLOON ANGIOPLASTY N/A 05/16/2020   Procedure: CORONARY BALLOON ANGIOPLASTY;  Surgeon: [name], [name] M, MD;  Location: [facility];  Service: Cardiovascular;  Laterality: N/A;   CORONARY STENT INTERVENTION N/A 05/16/2020   Procedure: CORONARY STENT INTERVENTION;  Surgeon: [name], [name] M, MD;  Location: [facility];  Service: Cardiovascular;  Laterality: N/A;   EYE SURGERY     gravid 2 para 2     INTRAVASCULAR ULTRASOUND/IVUS N/A 05/16/2020   Procedure: Intravascular Ultrasound/IVUS;   Surgeon: [name], [name] M, MD;  Location: [facility];  Service: Cardiovascular;  Laterality: N/A;   KNEE CARTILAGE SURGERY Right    LEFT AND RIGHT HEART CATHETERIZATION WITH CORONARY ANGIOGRAM N/A 05/07/2014   Procedure: LEFT AND RIGHT HEART CATHETERIZATION WITH CORONARY ANGIOGRAM;  Surgeon: [name] [place], MD;  Location: [facility];  Service: Cardiovascular;  Laterality: N/A;   PERIPHERAL VASCULAR INTERVENTION  04/11/2020   Procedure: PERIPHERAL VASCULAR INTERVENTION;  Surgeon: [name] Mould, MD;  Location: [facility];  Service: Cardiovascular;;   RIGHT HEART CATH N/A 02/04/2021   Procedure: RIGHT HEART CATH;  Surgeon: Jolaine Artist, MD;  Location: [facility];  Service: Cardiovascular;  Laterality: N/A;   RIGHT/LEFT HEART CATH  AND CORONARY ANGIOGRAPHY N/A 05/16/2020   Procedure: RIGHT/LEFT HEART CATH AND CORONARY ANGIOGRAPHY;  Surgeon: Jolaine Artist, MD;  Location: [facility];  Service: Cardiovascular;  Laterality: N/A;   TOTAL KNEE ARTHROPLASTY Right 06/28/2016   Procedure: TOTAL KNEE ARTHROPLASTY;  Surgeon: Frederik Pear, MD;  Location: [facility];  Service: Orthopedics;  Laterality: Right;   Social History   Socioeconomic History   Marital status: Widowed    Spouse name: Not on file   Number of children: 2   Years of education: 16   Highest education level: Bachelor's degree (e.g., BA, AB, BS)  Occupational History   Occupation: Teacher/ Retired  Tobacco Use   Smoking status: Former    Packs/day: 0.25    Years: 18.00    Pack years: 4.50    Types: Cigarettes    Quit date: 12/27/1968    Years since quitting: 52.7   Smokeless tobacco: Never   Tobacco comments:    smoked [place], up to 1 pp week  Vaping Use   Vaping Use: Never used  Substance and Sexual Activity   Alcohol use: Yes    Comment: occ   Drug use: No   Sexual activity: Never  Other Topics Concern   Not on file  Social History Narrative   Teacher. Widowed. Rarely drinks  cafeine.    Social Determinants of Health   Financial Resource Strain: Low Risk    Difficulty of Paying Living Expenses: Not hard at all  Food Insecurity: No Food Insecurity   Worried About Charity fundraiser in the Last Year: Never true   [place] in the Last Year: Never true  Transportation Needs: No Transportation Needs   Lack of Transportation (Medical): No   Lack of Transportation (Non-Medical): No  Physical Activity: Sufficiently Active   Days of Exercise per Week: 5 days   Minutes of Exercise per Session: 30 min  Stress: No Stress Concern Present   Feeling of Stress : Not at all  Social Connections: Moderately Integrated   Frequency of Communication with Friends and Family: More than three times a week   Frequency of Social Gatherings with Friends and Family: More than three times a week   Attends Religious Services: More than 4 times per year   Active Member of [employer] or Organizations: Yes   Attends Archivist Meetings: More than 4 times per year   Marital Status: Widowed  Human resources officer Violence: Not At Risk   Fear of Current or Ex-Partner: No   Emotionally Abused: No   Physically Abused: No   Sexually Abused: No   Current Outpatient Medications on File Prior to Visit  Medication Sig Dispense Refill   acetaminophen (TYLENOL) 325 MG tablet Take 2 tablets (650 mg total) by mouth every 6 (six) hours as needed for mild pain or headache. 20 tablet 0   amLODipine (NORVASC) 2.5 MG tablet Take 1 tablet (2.5 mg total) by mouth daily. May take extra 2.5 mg for systolic BP > 008 676 tablet 3   carvedilol (COREG) 6.25 MG tablet TAKE 1 TABLET BY MOUTH  TWICE DAILY WITH MEALS 180 tablet 3   ELIQUIS 5 MG TABS tablet TAKE 1 TABLET BY MOUTH  TWICE DAILY 60 tablet 11   glucose blood (ONETOUCH VERIO) test strip 1 each by Other route 2 (two) times daily. And lancets 2/day 200 strip 3   Insulin Lispro Prot & Lispro (HUMALOG MIX 75/25 KWIKPEN) (75-25) 100 UNIT/ML Kwikpen 32  units with breakfast and 6 units with dinner. 15 mL 11   Lancets (ONETOUCH DELICA PLUS JOINOM76H) MISC USE TO MONITOR GLUCOSE  LEVELS TWICE DAILY 200 each 3   Multiple Vitamins-Minerals (CENTRUM SILVER ULTRA WOMENS PO) Take 1 tablet by mouth daily.     nitroGLYCERIN (NITROSTAT) 0.4 MG SL tablet Place 1 tablet (0.4 mg total) under the tongue every 5 (five) minutes as needed for chest pain. Reported on 01/28/2016 30 tablet 3   NOVOFINE PLUS PEN NEEDLE 32G X 4 MM MISC SMARTSIG:1 Each SUB-Q Daily 200 each 1   OXYGEN Inhale 3 % into the lungs continuous.     potassium chloride SA (KLOR-CON) 20 MEQ tablet TAKE 2 TABLETS BY MOUTH  DAILY 180 tablet 3   torsemide (DEMADEX) 20 MG tablet TAKE 2 TABLETS BY MOUTH  TWICE DAILY 120 tablet 11   No current facility-administered medications on file prior to visit.   Allergies  Allergen Reactions   Levaquin [Levofloxacin] Shortness Of Breath and Swelling    angioedema   Lobster [Shellfish Allergy] Other (See Comments)    angioedema   Penicillins Rash    Has patient had a PCN reaction causing immediate rash, facial/tongue/throat swelling, SOB or lightheadedness with hypotension: Yes Has patient had a PCN reaction causing severe rash involving mucus membranes or skin necrosis: No Has patient had a PCN reaction that required hospitalization: No Has patient had a PCN reaction occurring within the last 10 years: No If all of the above answers are "NO", then may proceed with Cephalosporin use.   Valsartan Other (See Comments)    REACTION: angioedema   Codeine Other (See Comments)    Mental status changes   Lipitor [Atorvastatin] Other (See Comments)    weakness   Oxycodone Other (See Comments)    hallucinations   Statins     Made her too weak   Tramadol    Amlodipine Besylate Other (See Comments)    REACTION: tingling in lips & gum edema 7/12: talked to patient, states she is tolerating well   Crestor [Rosuvastatin Calcium] Rash   Tramadol Hcl Nausea And  Vomiting   Family History  Problem Relation Age of Onset   Diabetes Mother    Hypertension Mother    Transient ischemic attack Mother    Arthritis Mother    Heart attack Father 39   Arthritis Father    Hypertension Father    Breast cancer Maternal Aunt    Arthritis Maternal Aunt    PE: BP 140/60 (BP Location: Right Arm, Patient Position: Sitting, Cuff Size: Normal)   Pulse 74   Ht 5\' 3"  (1.6 [name])   Wt 172 lb 3.2 oz (78.1 kg)   SpO2 96%   BMI 30.50 kg/[name]  Wt Readings from Last 3 Encounters:  09/14/21 172 lb 3.2 oz (78.1 kg)  06/18/21 168 lb 3.2 oz (76.3 kg)  05/28/21 171 lb (77.6 kg)   Constitutional: overweight, in NAD Eyes: PERRLA, EOMI, no exophthalmos ENT: moist mucous membranes, no thyromegaly, no cervical lymphadenopathy Cardiovascular: RRR, No MRG, B pitting [name] edema as well as compression hoses Respiratory: CTA B Gastrointestinal: abdomen soft, NT, ND, BS+ Musculoskeletal: no deformities, strength intact in all 4 Skin: moist, warm, + stasis dermatitis rash bilateral feet Neurological: no tremor with outstretched hands, DTR normal in all 4  ASSESSMENT: 1. DM2, insulin-dependent, uncontrolled, with complications - CAD, s/p NSTEMI 2008 - CHF - PAD - atrial fibrillation/atrial flutter -previously on amiodarone - peripheral neuropathy - CKD stage IIIa  PLAN:  1. Patient with long-standing, uncontrolled diabetes, on mixed insulin regimen, which became insufficient.  Latest HbA1c was 8.3%, increased from 8.0%.  At this visit, we repeated her HbA1c and this was 9.0% (higher). - at this visit, we downloaded her meter and per review of the download, sugars appear to be quite fluctuating, without a particular pattern.  She is on a premixed insulin regimen with a larger dose in the morning, 32 units, taken without relationship to breakfast, and a small amount at night, taken before or after dinner.  She sometimes forgets to take this dose completed.  I explained that her  insulin is a mix of 2 insulins, 1 long-acting, short-acting.  Because of this, the dose is not very adjustable.  Reviewed her sugars increased to 902-409B after certain meals, I feel that we need to give her more flexible regimen.  However, due to her history of heart disease and complaints of increased appetite and weight gain, I suggested a GLP-1 receptor agonist.  Discussed about benefits and possible side effects. She is excited to try this.  We will start with Ozempic at a low dose and increase as tolerated. For now, we decided to keep her on the 70/30 insulin regimen due to price but I advised her to vary the dose based on her breakfast and also to take it 30 minutes before his meal.  For now I advised her to stop her insulin at night.  At next visit, if sugars are uncontrolled, we can either increase the dose of Ozempic or switch to a basal-bolus insulin regimen or bolus.  Due to her history of CHF, she would likely also benefit from an SGLT2 inhibitor -we will plan to start this in the near future. -We also analyzed her diet in detail and given specific suggestions about improving it.  She is drinking a protein during that is low-carb and I advised her to only use this as a meal replacement, not as a snack.  She is putting this into her smoothie in the morning.  I also suggested to possibly switch from the smoothie to oatmeal.  We discussed about fruit, dried fruit, and other snacks. - I suggested to:  Patient Instructions  Please continue: - Novolin 70/30 28-32 units 30 min before breakfast   Please start Ozempic 0.25 mg weekly in a.[name]. (for example on Sunday morning) x 4 weeks, then increase to 0.5 mg weekly in a.[name]. if no nausea or hypoglycemia.  Please return in 1.5 months with your sugar log.   - Strongly advised her to start checking sugars at different times of the day - check 2-3x a day, rotating checks - discussed about CBG targets for treatment: 80-150 mg/dL before meals and <180-200  mg/dL after meals; target HbA1c <7.4% (due to age). - given sugar log and advised how to fill it and to bring it at next appt  - given foot care handout and explained the principles  - given instructions for hypoglycemia management "15-15 rule"  - advised for yearly eye exams  - Return to clinic in 1.5 mo with sugar log   - Total time spent for the visit: 44, in obtaining medical information from the chart and from the patient and her daughter, reviewing Dr. Cordelia Pen notes, her  previous labs, imaging evaluations, and treatments, reviewing her symptoms, counseling her about her condition (please see the discussed topics above), and developing a plan to further investigate and treat it; she and her daughter had a number  of questions which I addressed.   Philemon Kingdom, MD PhD [facility] Endocrinology

## 2021-09-23 ENCOUNTER — Telehealth: Payer: Self-pay | Admitting: *Deleted

## 2021-09-23 NOTE — Chronic Care Management (AMB) (Signed)
  Care Management   Note  09/23/2021 Name: MASSIAH LONGANECKER MRN: [id number] DOB: October 22, 1931  DALLYS NOWAKOWSKI is a 87 y.o. year old female who is a primary care patient of Quay Burow, [name], MD and is actively engaged with the care management team. I reached out to [name] by phone today to assist with re-scheduling a follow up visit with the RN Case Manager  Follow up plan: Unsuccessful telephone outreach attempt made. A HIPAA compliant phone message was left for the patient providing contact information and requesting a return call.  The care management team will reach out to the patient again over the next 7 days.  If patient returns call to provider office, please advise to call [place] at [phone].  [facility]  Direct Dial: [phone]

## 2021-09-28 NOTE — Chronic Care Management (AMB) (Signed)
  Care Management   Note  09/28/2021 Name: [name] MRN: [id number] DOB: 06/25/1931  [name] is a 87 y.o. year old female who is a primary care patient of Quay Burow, [name], MD and is actively engaged with the care management team. I reached out to [name] by phone today to assist with re-scheduling a follow up visit with the RN Case Manager  Follow up plan: Telephone appointment with care management team member scheduled for:10/02/21  [facility]  Direct Dial: [phone]

## 2021-10-01 ENCOUNTER — Encounter: Payer: Self-pay | Admitting: Internal Medicine

## 2021-10-02 ENCOUNTER — Ambulatory Visit (INDEPENDENT_AMBULATORY_CARE_PROVIDER_SITE_OTHER): Payer: Medicare Other | Admitting: *Deleted

## 2021-10-02 DIAGNOSIS — E1122 Type 2 diabetes mellitus with diabetic chronic kidney disease: Secondary | ICD-10-CM

## 2021-10-02 DIAGNOSIS — Z794 Long term (current) use of insulin: Secondary | ICD-10-CM

## 2021-10-02 DIAGNOSIS — I5032 Chronic diastolic (congestive) heart failure: Secondary | ICD-10-CM

## 2021-10-02 NOTE — Patient Instructions (Signed)
Visit [name], it was nice talking with you today.    I look forward to talking to you again for an update on Tuesday, October 27, 2021 at 9:00 am- please be listening out for my call that day.  I will call as close to 9:00 am as possible.   If you need to cancel or re-schedule our telephone visit, please call [phone] and one of our care guides will be happy to assist you.   I look forward to hearing about your progress.   Please don't hesitate to contact me if I can be of assistance to you before our next scheduled telephone appointment.   [name] Rack, RN, BSN, [facility] RN Care Coordination- [place] [phone]: direct office [phone]: mobile   PATIENT GOALS:  Goals Addressed             This Visit's Progress    Monitor and Manage My Blood Sugar-Diabetes Type 2   On track    Timeframe:  Long-Range Goal Priority:  Medium Start Date:       02/17/21                      Expected End Date:       12/25/21               Follow Up Date 10/27/21  Please continue to:    Check blood sugar at prescribed times, or if I feel it is too high or too low.  Keep up entering blood sugar readings and medication or insulin into daily log Take the blood sugar log to all doctor visits Continue your excellent efforts to follow low sugar/ carb modified diet-- you have great ideas around how to manage your diet for times that you do not want to eat the food that is prepared for you by your chef at [facility]; you're doing a good job controlling portion size: keep up the great work  We talked about your recent A1-C level of 9.0 on 09/14/21--  this correlates to an average blood sugar of 210: I am very glad that you have switched endocrinologists, and that you are writing down all of your blood sugars to take with you to your next scheduled visit with Dr. [name] on 10/21/21-- these blood sugar values from home will help Dr. [name] know  how to adjust your medications if necessary Keep up the great work staying active [place] job staying active and walking for exercise when the weather is nice- keep up the great work  Continue your prescribed medication regimen  Contacting care providers for new or worsened symptoms, concerns or questions   Why is this important?   Checking your blood sugar at home helps to keep it from getting very high or very low.  Writing the results in a diary or log helps the doctor know how to care for you.  Your blood sugar log should have the time, date and the results.  Also, write down the amount of insulin or other medicine that you take.  Other information, like what you ate, exercise done and how you were feeling, will also be helpful.          Track and Manage Fluids and Swelling-Heart Failure   On track    Timeframe:  Long-Range Goal Priority:  Medium Start Date:         02/17/21  Expected End Date:       12/25/21               Follow Up Date 10/27/21   Continue using home oxygen as prescribed; as we discussed today, I reached out to your pulmonary team on 06/22/21 to request follow up on your request for portable oxygen concentrator- please discuss this matter further with your pulmonary provider when you attend your appointment on 10/07/21 Continue to monitor your oxygen levels regularly at home: with activity and at rest, and if you ever start to feel unusually short of breath Continue to weigh daily: you reported weights at home today consistently at 164 lbs; You are on track with maintaining your goal weight- keep up the great work tracking weight in your health journal Call office if I gain more than 2 pounds in one day or 5 pounds in one week Continue using salt in moderation and keeping up with the amount of fluid you take in every day Watch for swelling in feet, ankles and legs every day- if you experience new/ unusual swelling that does not go away promptly, call  your heart failure doctor   Why is this important?   It is important to check your weight daily and watch how much salt and liquids you have.  It will help you to manage your heart failure.           Type 1 Diabetes Mellitus, Self-Care, Adult When you have type 1 diabetes (type 1 diabetes mellitus), you must make sure your blood sugar (glucose) stays in a healthy range. You can do this with: Insulin. Healthy foods. Exercise. Lifestyle changes. Other medicines, if needed. Support from doctors and others. What are the risks? Having diabetes can put you at risk for heart disease and kidney disease. These problems can get worse if you do not take good care of yourself and keep your blood sugar in a healthy range. How to stay aware of blood sugar  Check your blood sugar every day, as often as told. Have your A1C (hemoglobin A1C) level checked two or more times a year. Have it checked more often if your doctor tells you to. Try to meet the treatment goals set by your doctor. Try to have these blood sugar levels: Before meals (preprandial): 80-130 mg/dL (4.4-7.2 mmol/L). After meals (postprandial): below 180 mg/dL (10 mmol/L). A1C level: less than 7%. Follow these instructions at home: Medicines Take over-the-counter and prescription medicines only as told by your doctor. Take insulin and medicines every day as told. Do not run out of insulin or medicines. Change the amount of insulin you take based on how active you are and what foods you eat. Your doctor will tell you the amount to take. Eating and drinking  Choose healthy foods, such as: Low-fat (lean) proteins. Complex carbs (carbohydrates), such aswhole wheat. Fresh fruits and vegetables. Low-fat dairy products. Healthy fats. Meet with a food expert (dietitian) to help you make an eating plan that is right for you. Be sure to: Follow instructions from your doctor about what you cannot eat or drink. Drink enough fluid to  keep your pee (urine) pale yellow. Keep track of carbs that you eat. Read food labels and learn food serving sizes. Follow your sick-day plan when you cannot eat or drink normally. Make this plan with your doctor so it is ready. Have a 15-gram fast-acting carb snack with you at all times to treat low blood sugar. Activity Get regular exercise as  told. Spread out your exercise over 3 or more days a week. Do not go more than 2 days in a row without exercise. Talk with your doctor before you start a new exercise. Lifestyle Do not use any products that contain nicotine or tobacco, such as cigarettes, e-cigarettes, and chewing tobacco. If you need help quitting, ask your doctor. If your doctor says that alcohol is safe for you: Limit how much you use to: 0-1 drink a day for women who are not pregnant. 0-2 drinks a day for men. Be aware of how much alcohol is in your drink. In the [place] one drink equals one 12 oz bottle of beer (355 mL), one 5 oz glass of wine (148 mL), or one 1 oz glass of hard liquor (44 mL). Learn to lower stress. If you need help, ask your doctor. Body care  Stay up to date with your shots (vaccinations) as told by your doctor. Get shots for: The flu. Pneumonia. Hepatitis B. Have your eyes and feet checked by a doctor as often as told. Check your skin and feet every day. Check for cuts, bruises, redness, blisters, or sores. Brush your teeth and gums two times a day. Floss one or more times a day. Go to the dentist one or more times every 6 months. Stay at a healthy weight. General instructions Share your diabetes care plan with people at work, school, and home. Make sure your family and close friends learn the symptoms of low blood sugar and can help treat you. You will need their help if you cannot treat yourself. Check your pee for ketones when you are sick and as told. Carry a card or wear jewelry that says you have diabetes. Keep all follow-up visits as told by  your doctor. This is important. Questions to ask your doctor Do I need to meet with a diabetes educator? Where can I find a support group for people with diabetes? Should I have a glucagon shot kit at home? Where to find more information American Diabetes Association (ADA): diabetes.org Association of Diabetes Care and Education Specialists (ADCES): diabeteseducator.org International Diabetes Federation (IDF): https://www.munoz-bell.org/ Get help right away if: Your blood sugar is below 54 mg/dL (3 mmol/L). You have medium or large levels of ketones in your pee. These symptoms may be an emergency. Do not wait to see if the symptoms will go away. Get medical help right away. Call your local emergency services (911 in the [place] Do not drive yourself to the hospital. Summary When you have type 1 diabetes, you must make sure your blood sugar (glucose) stays in a healthy range. Diabetes can raise your risk for heart disease and kidney disease. These problems can get worse if you do not take good care of yourself and keep your blood sugar in a healthy range. Check your blood sugar every day, as often as told. Keep all follow-up visits as told by your doctor. This is important. This information is not intended to replace advice given to you by your health care provider. Make sure you discuss any questions you have with your health care provider. Document Revised: 01/31/2020 Document Reviewed: 01/31/2020 Elsevier Patient Education  [place].   The patient verbalized understanding of instructions, educational materials, and care plan provided today and agreed to receive a mailed copy of patient instructions, educational materials, and care plan Telephone follow up appointment with care management team member scheduled for:  Tuesday, October 27, 2021 at 9:00 a The patient has been  provided with contact information for the care management team and has been advised to call with any health related questions or  concerns  [name] Rack, RN, BSN, [place] [phone]: direct office [phone]: mobile

## 2021-10-02 NOTE — Chronic Care Management (AMB) (Signed)
Chronic Care Management   CCM RN Visit Note  10/02/2021 Name: [name] MRN: [id number] DOB: March 08, 1931  Subjective: [name] is a 87 y.o. year old female who is a primary care patient of Burns, [name], MD. The care management team was consulted for assistance with disease management and care coordination needs.    Engaged with patient by telephone for follow up visit in response to provider referral for case management and/or care coordination services.   Consent to Services:  The patient was given information about Chronic Care Management services, agreed to services, and gave verbal consent prior to initiation of services.  Please see initial visit note for detailed documentation.  Patient agreed to services and verbal consent obtained.   Assessment: Review of patient past medical history, allergies, medications, health status, including review of consultants reports, laboratory and other test data, was performed as part of comprehensive evaluation and provision of chronic care management services.    CCM Care Plan Allergies  Allergen Reactions   Levaquin [Levofloxacin] Shortness Of Breath and Swelling    angioedema   Lobster [Shellfish Allergy] Other (See Comments)    angioedema   Penicillins Rash    Has patient had a PCN reaction causing immediate rash, facial/tongue/throat swelling, SOB or lightheadedness with hypotension: Yes Has patient had a PCN reaction causing severe rash involving mucus membranes or skin necrosis: No Has patient had a PCN reaction that required hospitalization: No Has patient had a PCN reaction occurring within the last 10 years: No If all of the above answers are "NO", then may proceed with Cephalosporin use.   Valsartan Other (See Comments)    REACTION: angioedema   Codeine Other (See Comments)    Mental status changes   Lipitor [Atorvastatin] Other (See Comments)    weakness   Oxycodone Other (See Comments)    hallucinations    Statins     Made her too weak   Tramadol    Amlodipine Besylate Other (See Comments)    REACTION: tingling in lips & gum edema 7/12: talked to patient, states she is tolerating well   Crestor [Rosuvastatin Calcium] Rash   Tramadol Hcl Nausea And Vomiting   Outpatient Encounter Medications as of 10/02/2021  Medication Sig Note   acetaminophen (TYLENOL) 325 MG tablet Take 2 tablets (650 mg total) by mouth every 6 (six) hours as needed for mild pain or headache.    amLODipine (NORVASC) 2.5 MG tablet Take 1 tablet (2.5 mg total) by mouth daily. May take extra 2.5 mg for systolic BP > 510    carvedilol (COREG) 6.25 MG tablet TAKE 1 TABLET BY MOUTH  TWICE DAILY WITH MEALS    ELIQUIS 5 MG TABS tablet TAKE 1 TABLET BY MOUTH  TWICE DAILY    glucose blood (ONETOUCH VERIO) test strip 1 each by Other route 2 (two) times daily. And lancets 2/day    Insulin Lispro Prot & Lispro (HUMALOG MIX 75/25 KWIKPEN) (75-25) 100 UNIT/ML Kwikpen 32 units with breakfast and 6 units with dinner.    Lancet Devices (LANCING DEVICE) MISC Use as advised - for Delica Lancets    Lancets (ONETOUCH DELICA PLUS CHENID78E) MISC USE TO MONITOR GLUCOSE  LEVELS TWICE DAILY    Multiple Vitamins-Minerals (CENTRUM SILVER ULTRA WOMENS PO) Take 1 tablet by mouth daily.    nitroGLYCERIN (NITROSTAT) 0.4 MG SL tablet Place 1 tablet (0.4 mg total) under the tongue every 5 (five) minutes as needed for chest pain. Reported on 01/28/2016 03/10/2021: 03/10/21:  Reports has not needed recently   NOVOFINE PLUS PEN NEEDLE 32G X 4 MM MISC SMARTSIG:1 Each SUB-Q Daily    OXYGEN Inhale 3 % into the lungs continuous.    potassium chloride SA (KLOR-CON) 20 MEQ tablet TAKE 2 TABLETS BY MOUTH  DAILY    Semaglutide,0.25 or 0.5MG/DOS, (OZEMPIC, 0.25 OR 0.5 MG/DOSE,) 2 MG/1.5ML SOPN Inject 0.5 mg into the skin once a week.    torsemide (DEMADEX) 20 MG tablet TAKE 2 TABLETS BY MOUTH  TWICE DAILY    No facility-administered encounter medications on file as of  10/02/2021.   Patient Active Problem List   Diagnosis Date Noted   Chronic respiratory failure with hypoxia ([place]) 05/12/2021   Acute respiratory failure with hypoxia and hypercapnia ([facility]) 01/31/2021   Elevated troponin 01/31/2021   Poorly controlled type 2 diabetes mellitus with circulatory disorder ([place]) 01/31/2021   Neuropathy 08/19/2020   PAD (peripheral artery disease) ([place]) 02/29/2020   Chronic kidney disease (CKD) 02/20/2020   Osteoarthritis 08/01/2019   Arthritis of left sacroiliac joint 05/04/2017   Osteopenia 02/17/2017   Piriformis syndrome of left side 02/16/2017   Acquired leg length discrepancy 02/16/2017   COPD GOLD I  01/27/2017   Lumbar radiculopathy, acute 01/27/2017   Granuloma annulare 07/27/2016   Numbness of fingers 07/27/2016   Primary osteoarthritis of right knee 06/27/2016   Rash and nonspecific skin eruption 06/09/2016   Diabetes ([place]) 03/07/2016   Acute on chronic combined systolic (congestive) and diastolic (congestive) heart failure ([facility])    OSA  08/28/2014   Chronic diastolic heart failure ([place]) 07/30/2014   Nocturnal hypoxemia 07/09/2014   SOB (shortness of breath) 05/15/2014   Pulmonary HTN ([place]) 05/15/2014   Band keratopathy 01/16/2014   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   Hyperthyroidism 11/04/2011   Multinodular goiter 10/14/2011   Atrophy, Fuchs' 09/29/2011   ATRIAL FIBRILLATION 06/03/2010   VITILIGO 11/07/2009   SINOATRIAL NODE DYSFUNCTION 06/16/2009   Atrial flutter ([place]) 06/25/2008   HYPERLIPIDEMIA 01/29/2008   Coronary atherosclerosis 01/29/2008   HTN (hypertension) 10/30/2007   Conditions to be addressed/monitored:  CHF and DMII  Care Plan : Heart Failure (Adult)  Updates made by Knox Royalty, RN since 10/02/2021 12:00 AM     Problem: Symptom Exacerbation (Heart Failure)   Priority: High     Long-Range Goal: Symptom Exacerbation Prevented or Minimized   Start Date: 02/17/2021  Expected End Date: 12/25/2021  This  Visit's Progress: On track  Recent Progress: On track  Priority: Medium  Note:   Current Barriers:  Knowledge deficits related to use of new home Oxygen and need for ongoing reinforcement of basic heart failure pathophysiology and self care management Unable to independently verbalize long-term plan for use of home O2, newly prescribed Nurse Case Manager Clinical Goal(s):  Over the next 8 months, patient will continue following heart failure action plan as evidenced by patient reporting during CCM RN CM outreach of: Monitoring/ recording daily weight at home Using home O2 with activity and CPAP at night Following action plan for development of signs/ symptoms CHF exacerbation/ yellow CHF zone Interventions:  Collaboration with [name], MD regarding development and update of comprehensive plan of care as evidenced by provider attestation and co-signature Inter-disciplinary care team collaboration (see longitudinal plan of care) Chart reviewed including relevant office notes, upcoming scheduled appointments, and lab results Discussed current  clinical condition with patient and confirmed no current clinical or medication concerns; confirmed patient continues to manage medications independently at home and  reports adherence to medication regimen:  Confirmed patient continues to monitor and write down on paper daily weights at home: she reports consistent weights today at "164 lbs" and states she has lost weight since she started taking newly prescribed Ozempic Confirmed patient continues to use home O2, during activity and prn: she reports she did not hear back from previously placed care coordination outreach 06/22/21 with pulmonary team about obtaining portable, light-weight O2 tank: verbalizes plans to discuss with pulmonary provider at time of scheduled office visit 10/07/21 Continues to verbalize desire to "wean home O2;" verbalizes plan for ongoing discussion as above, at time of next  office visit with pulmonary team Confirmed patient continues to monitor SaO2 frequently each day: she denies concerns, states values consistently 95-98% "with or without O2 on" Confirmed patient remains able to verbalize with minimal prompting previously provided education around signs/ symptoms yellow CHF zone/ weight gain guidelines in setting of CHF along with corresponding action plan Confirmed patient continues to endorse following low salt, heart healthy diet: "as best as" she can-- 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; [employer] as above to begin preparing her own meals when necessary, when prepared foods are saltier than she should eat-- discussed several strategies that patient has recently taken to continue following low salt- diet Reviewed upcoming provider appointments with patient and confirmed that patient has plans to attend all as scheduled: 10/07/21- pulmonary provider; 10/21/21- endocrinology provider; 12/17/21- CHF clinic: patient aware of all and verbalizes plans to attend all as scheduled Discussed plans with patient for ongoing care management follow up and confirmed patient has direct contact information for care management team Patient Goals/Self-Care Activities Continue using home oxygen as prescribed; as we discussed today, I reached out to your pulmonary team on 06/22/21 to request follow up on your request for portable oxygen concentrator- please discuss this matter further with your pulmonary provider when you attend your appointment on 10/07/21 Continue to monitor your oxygen levels regularly at home: with activity and at rest, and if you ever start to feel unusually short of breath Continue to weigh daily: you reported weights at home today consistently at 164 lbs; You are on track with maintaining your goal weight- keep up the great work tracking weight in your health journal Call office if I gain more than 2 pounds in one day or 5 pounds in one week Continue using salt in  moderation and keeping up with the amount of fluid you take in every day Watch for swelling in feet, ankles and legs every day- if you experience new/ unusual swelling that does not go away promptly, call your heart failure doctor Follow Up Plan:  Telephone follow up appointment with care management team member scheduled for: Tuesday October 27, 2021 at 9:00 am The patient has been provided with contact information for the care management team and has been advised to call with any health related questions or concerns.      Care Plan : Diabetes Type 2 (Adult)  Updates made by Knox Royalty, RN since 10/02/2021 12:00 AM     Problem: Glycemic Management (Diabetes, Type 2)   Priority: High     Long-Range Goal: Glycemic Management Optimized   Start Date: 02/17/2021  Expected End Date: 12/25/2021  This Visit's Progress: On track  Recent Progress: On track  Priority: Medium  Note:   Objective:  Lab Results  Component Value Date   HGBA1C 9.5 (A) 12/02/2020   Lab Results  Component Value Date  CREATININE 0.84 02/05/2021   CREATININE 0.79 02/04/2021   CREATININE 0.84 02/03/2021   No results found for: EGFR Current Barriers:  Knowledge Deficits related to basic Diabetes pathophysiology and self care/management: patient could benefit from ongoing reinforcement of self-health management strategies for DM Knowledge Deficits related to medications used for management of diabetes- patient actively engaged with CCM Pharmacist at PCP office No self-care deficits: patient resides at independent living facility and has support if needed; patient verbalizes accurate general understanding of plan of care post- recent hospital discharge 02/05/21 Case Manager Clinical Goal(s):  Over the next 8 months, patient will demonstrate ongoing adherence to prescribed treatment plan for diabetes self care/management as evidenced by:  Daily monitoring and recording of CBG twice daily  Adherence to ADA/ carb  modified diet  Light exercise with established PT at [facility] 4-5 days/week; walking when weather is nice  Adherence to prescribed medication regimen  Contacting care providers for new or worsened symptoms, concerns or questions Interventions:  Collaboration with [name], MD regarding development and update of comprehensive plan of care as evidenced by provider attestation and co-signature Inter-disciplinary care team collaboration (see longitudinal plan of care) Review of patient status, including review of consultants reports, relevant laboratory and other test results, and medications completed Reviewed recent office visit with endocrinologist 09/14/21 with patient: confirmed that she is able to verbalize changes made to insulin dosing and confirmed that she is taking insulin as prescribed and has no questions/ concerns- positive reinforcement provided Discussed patient's most recently obtained A1-C level of 9.0 (09/14/21): reiterated previously provided education around general correlation of average blood sugars at home to A1-C levels Positive reinforcement provided to patient for switching to new endocrinologist: she reports "loves" Dr. [name], and is excited to working with her on blood sugar management Confirms that she is taking newly prescribed Ozempic, as per instructions: currently remains on 0.25 mg q week- confirms not having side effects; verbalizes plans to increase dose to 0.5 mg q week, starting 10/16/21; confirms that since she has started taking Ozempic, she has lost "about 7 pounds;" positive reinforcement provided: she is very happy about this Confirmed patient has been experiencing higher than normal fasting blood sugars since starting new dosing of insulin/ Ozempic 09/14/21-- she confirms that she has sent message to endocrinologist notifying of same-- positive reinforcement provided that patient is engaging with endocrinologist and informing of concerns/  updates Discussed role of liver glucose secretion during sleep- encouraged patient to discuss further with endocrinologist at time of next scheduled office visit: she will do Reviewed with patient recent blood sugars at home:  Reports fasting blood sugars consistently between 160-170; had isolated lower value of "146" and "a few" isolated higher values > 200, one at "234" Reports post-prandial blood sugar ranges between 120-167; reports has had "a couple" of episodes low blood sugars, post-prandial AND after eating: reports "in 50's-60's"- she states that this occurred because she chose to not eat a large amount of the food prepared by the chef at her retirement living center- choice she made because foods were high in carbs- sugars- or high in salt Confirmed patient experienced symptoms of hypoglycemia and immediately addressed low blood sugar with eating glucose pills Discussed with patient ongoing challenges with following prescribed diet, in light of chef prepared food at retirement center: she verbalizes new plan today to begin having food in her apartment (salad, chicken, etc) that can be prepared in microwave; no stovetop/ oven in apartment- but patient does  have a refrigerator: positive reinforcement provided Reviewed upcoming follow up endocrinology appointment with patient scheduled for October 21, 2021: confirmed she has plans to attend as scheduled Discussed plans with patient for ongoing care management follow up and confirmed patient has direct contact information for care management team Patient Goals: Check blood sugar at prescribed times, or if I feel it is too high or too low.  Keep up entering blood sugar readings and medication or insulin into daily log Take the blood sugar log to all doctor visits Continue your excellent efforts to follow low sugar/ carb modified diet-- you have great ideas around how to manage your diet for times that you do not want to eat the food that is prepared  for you by your chef at [facility]; you're doing a good job controlling portion size: keep up the great work  We talked about your recent A1-C level of 9.0 on 09/14/21--  this correlates to an average blood sugar of 210: I am very glad that you have switched endocrinologists, and that you are writing down all of your blood sugars to take with you to your next scheduled visit with Dr. [name] on 10/21/21-- these blood sugar values from home will help Dr. [name] know how to adjust your medications if necessary Keep up the great work staying active [place] job staying active and walking for exercise when the weather is nice- keep up the great work  Continue your prescribed medication regimen  Contacting care providers for new or worsened symptoms, concerns or questions Self-Care Activities Self administers oral medications as prescribed Self administers insulin as prescribed Attends all scheduled provider appointments Checks blood sugars as prescribed Adheres to prescribed ADA/carb modified and is motivated to ongoing learning around same Follow Up Plan:  Telephone follow up appointment with care management team member scheduled for: Tuesday, October 27, 2021 at 9:00 am The patient has been provided with contact information for the care management team and has been advised to call with any health related questions or concerns.      Plan: Telephone follow up appointment with care management team member scheduled for:  Tuesday, October 27, 2021 at 9:00 am The patient has been provided with contact information for the care management team and has been advised to call with any health related questions or concerns   [name], RN, BSN, [place] [phone]: direct office [phone]: mobile

## 2021-10-05 ENCOUNTER — Encounter: Payer: Self-pay | Admitting: Internal Medicine

## 2021-10-05 DIAGNOSIS — G4733 Obstructive sleep apnea (adult) (pediatric): Secondary | ICD-10-CM | POA: Diagnosis not present

## 2021-10-05 DIAGNOSIS — I5033 Acute on chronic diastolic (congestive) heart failure: Secondary | ICD-10-CM | POA: Diagnosis not present

## 2021-10-05 DIAGNOSIS — J9601 Acute respiratory failure with hypoxia: Secondary | ICD-10-CM | POA: Diagnosis not present

## 2021-10-05 DIAGNOSIS — J9602 Acute respiratory failure with hypercapnia: Secondary | ICD-10-CM | POA: Diagnosis not present

## 2021-10-06 DIAGNOSIS — J9602 Acute respiratory failure with hypercapnia: Secondary | ICD-10-CM | POA: Diagnosis not present

## 2021-10-06 DIAGNOSIS — J9601 Acute respiratory failure with hypoxia: Secondary | ICD-10-CM | POA: Diagnosis not present

## 2021-10-06 DIAGNOSIS — G4733 Obstructive sleep apnea (adult) (pediatric): Secondary | ICD-10-CM | POA: Diagnosis not present

## 2021-10-06 DIAGNOSIS — I5033 Acute on chronic diastolic (congestive) heart failure: Secondary | ICD-10-CM | POA: Diagnosis not present

## 2021-10-07 ENCOUNTER — Other Ambulatory Visit: Payer: Self-pay

## 2021-10-07 ENCOUNTER — Encounter: Payer: Self-pay | Admitting: Pulmonary Disease

## 2021-10-07 ENCOUNTER — Ambulatory Visit (INDEPENDENT_AMBULATORY_CARE_PROVIDER_SITE_OTHER): Payer: Medicare Other | Admitting: Pulmonary Disease

## 2021-10-07 VITALS — BP 142/62 | HR 62 | Temp 98.4°F | Ht 63.0 in | Wt 168.6 lb

## 2021-10-07 DIAGNOSIS — Z9989 Dependence on other enabling machines and devices: Secondary | ICD-10-CM | POA: Diagnosis not present

## 2021-10-07 DIAGNOSIS — Z23 Encounter for immunization: Secondary | ICD-10-CM | POA: Diagnosis not present

## 2021-10-07 DIAGNOSIS — J432 Centrilobular emphysema: Secondary | ICD-10-CM | POA: Diagnosis not present

## 2021-10-07 DIAGNOSIS — G4733 Obstructive sleep apnea (adult) (pediatric): Secondary | ICD-10-CM | POA: Diagnosis not present

## 2021-10-07 DIAGNOSIS — J9611 Chronic respiratory failure with hypoxia: Secondary | ICD-10-CM

## 2021-10-07 NOTE — Progress Notes (Signed)
[facility]  Chief Complaint  Patient presents with   Follow-up    OSA    Past Surgical History:  She  has a past surgical history that includes Cholecystectomy; Knee cartilage surgery (Right); Eye surgery; A flutter ablation; gravid 2 para 2; Colonoscopy (6/07); Coronary angioplasty with stent (02/2007); colonoscopy with polypectomy; left and right heart catheterization with coronary angiogram (N/A, 05/07/2014); Cataract extraction w/ intraocular lens  implant, bilateral (Bilateral); Corneal transplant (Bilateral); Total knee arthroplasty (Right, 06/28/2016); ABDOMINAL AORTOGRAM W/LOWER EXTREMITY (Bilateral, 04/11/2020); PERIPHERAL VASCULAR INTERVENTION (04/11/2020); RIGHT/LEFT HEART CATH AND CORONARY ANGIOGRAPHY (N/A, 05/16/2020); CORONARY STENT INTERVENTION (N/A, 05/16/2020); Intravascular Ultrasound/IVUS (N/A, 05/16/2020); CORONARY ATHERECTOMY (N/A, 05/16/2020); CORONARY BALLOON ANGIOPLASTY (N/A, 05/16/2020); and RIGHT HEART CATH (N/A, 02/04/2021).  Past Medical History:  A fib, CAD, HTN, HLD, Chronic diastolic dysfx, COPD, DM, Allergies  Constitutional:  BP (!) 142/62 (BP Location: Left Arm, Cuff Size: Normal)   Pulse 62   Temp 98.4 F (36.9 C) (Oral)   Ht 5\' 3"  (1.6 m)   Wt 168 lb 9.6 oz (76.5 kg)   SpO2 92% Comment: 12L  BMI 29.87 kg/m   Brief Summary:  [name] is a 87 y.o. female former smoker with obstructive sleep apnea and COPD with emphysema.      Subjective:   She is here with her daughter.  She uses oxygen intermittently during the day.  She has been using at night.  She doesn't feel like she needs an inhaler.  She uses CPAP nightly.  Gets bout 5 hours per night, but sleeps longer.  No issues with mask fit.  Not having cough, wheeze, sputum, or chest pain.  Physical Exam:   Appearance - well kempt   ENMT - no sinus tenderness, no oral exudate, no LAN, Mallampati 2 airway, no stridor  Respiratory - equal breath sounds  bilaterally, no wheezing or rales  CV - s1s2 regular rate and rhythm, no murmurs  Ext - no clubbing, no edema  Skin - no rashes  Psych - normal mood and affect    Pulmonary testing:  PFT 05/21/14 >> FEV1 1.37 (94%), FEV1% 66, TLC 4.69 (95%), DLCO 39%  Chest Imaging:  CT chest 05/24/15 >> mild centrilobular emphysema CT chest 05/25/17 >> moderate centrilobular emphysema, atherosclerosis  Sleep Tests:  PSG 7/14//15 >> AHI 37 HST 02/21/17 >> AHI 30.5, SaO2 low 78% Auto CPAP 10/23/17 to 11/21/17 >> used on 30 of 30 nights with average 6 hrs 34 min.  Average AHI 1 with median CPAP 8 and 95 th percentile CPAP 11 cm H2O HST 03/14/20 >> AHI 15.3, SpO low 79% Auto CPAP 09/07/21 to 10/06/21 >> used on 30 of 30 nights with average 4 hrs 48 min.  Average AHI 3.7 with median CPAP 8 and 95 th percentile CPAP 12 cm H2O  Cardiac Tests:  Echo 05/16/20 >> EF 50 to 55%, mod LVH, grade 3 DD, mild MR  Social History:  She  reports that she quit smoking about 52 years ago. Her smoking use included cigarettes. She has a 4.50 pack-year smoking history. She has never used smokeless tobacco. She reports current alcohol use. She reports that she does not use drugs.  Family History:  Her family history includes Arthritis in her father, maternal aunt, and mother; Breast cancer in her maternal aunt; Diabetes in her mother; Heart attack (age of onset: 54) in her father; Hypertension in her father and mother; Transient ischemic attack in her mother.  Assessment/Plan:   Obstructive sleep apnea. - she is compliant with CPAP and reports benefit from therapy - she uses [employer] for her DME - continue auto CPAP 5 to 20 cm H2O   COPD with emphysema. - minimal symptoms - she doesn't feel spiriva helps and is concerned about cost of medication - advised her to contact office if she feels like she needs an inhaler - high dose flu shot today  Chronic respiratory failure with hypoxia. - 2 liters oxygen at rest and 3  liters with exertion - 2 liters at night with CPAP - discussed importance of using supplemental oxygen when indicated - will arrange for a POC  Time Spent Involved in Patient Care on Day of Examination:  34 minutes  Follow up:   Patient Instructions  Call if you would like to get a prescription for an inhaler  Keep track of your oxygen level when doing activity  Use your CPAP for the entire time you are asleep  Follow up in 6 months  Medication List:   Allergies as of 10/07/2021       Reactions   Levaquin [levofloxacin] Shortness Of Breath, Swelling   angioedema   Lobster [shellfish Allergy] Other (See Comments)   angioedema   Penicillins Rash   Has patient had a PCN reaction causing immediate rash, facial/tongue/throat swelling, SOB or lightheadedness with hypotension: Yes Has patient had a PCN reaction causing severe rash involving mucus membranes or skin necrosis: No Has patient had a PCN reaction that required hospitalization: No Has patient had a PCN reaction occurring within the last 10 years: No If all of the above answers are "NO", then may proceed with Cephalosporin use.   Valsartan Other (See Comments)   REACTION: angioedema   Codeine Other (See Comments)   Mental status changes   Lipitor [atorvastatin] Other (See Comments)   weakness   Oxycodone Other (See Comments)   hallucinations   Statins    Made her too weak   Tramadol    Amlodipine Besylate Other (See Comments)   REACTION: tingling in lips & gum edema 7/12: talked to patient, states she is tolerating well   Crestor [rosuvastatin Calcium] Rash   Tramadol Hcl Nausea And Vomiting        Medication List        Accurate as of October 07, 2021 10:14 AM. If you have any questions, ask your nurse or doctor.          acetaminophen 325 MG tablet Commonly known as: TYLENOL Take 2 tablets (650 mg total) by mouth every 6 (six) hours as needed for mild pain or headache.   amLODipine 2.5 MG  tablet Commonly known as: NORVASC Take 1 tablet (2.5 mg total) by mouth daily. May take extra 2.5 mg for systolic BP > 937   carvedilol 6.25 MG tablet Commonly known as: COREG TAKE 1 TABLET BY MOUTH  TWICE DAILY WITH MEALS   CENTRUM SILVER ULTRA WOMENS PO Take 1 tablet by mouth daily.   Eliquis 5 MG Tabs tablet Generic drug: apixaban TAKE 1 TABLET BY MOUTH  TWICE DAILY   Insulin Lispro Prot & Lispro (75-25) 100 UNIT/ML Kwikpen Commonly known as: HumaLOG Mix 75/25 KwikPen 32 units with breakfast and 6 units with dinner.   Lancing Device Misc Use as advised - for Delica Lancets   nitroGLYCERIN 0.4 MG SL tablet Commonly known as: NITROSTAT Place 1 tablet (0.4 mg total) under the tongue every 5 (five) minutes as needed for chest pain.  Reported on 01/28/2016   NovoFine Plus Pen Needle 32G X 4 MM Misc Generic drug: Insulin Pen Needle SMARTSIG:1 Each SUB-Q Daily   OneTouch Delica Plus [id number] Misc USE TO MONITOR GLUCOSE  LEVELS TWICE DAILY   OneTouch Verio test strip Generic drug: glucose blood 1 each by Other route 2 (two) times daily. And lancets 2/day   OXYGEN Inhale 3 % into the lungs continuous.   Ozempic (0.25 or 0.5 MG/DOSE) 2 MG/1.5ML Sopn Generic drug: Semaglutide(0.25 or 0.5MG /DOS) Inject 0.5 mg into the skin once a week.   potassium chloride SA 20 MEQ tablet Commonly known as: KLOR-CON TAKE 2 TABLETS BY MOUTH  DAILY   torsemide 20 MG tablet Commonly known as: DEMADEX TAKE 2 TABLETS BY MOUTH  TWICE DAILY        Signature:  Chesley Mires, MD [place] Pager - [phone] 10/07/2021, 10:14 AM

## 2021-10-07 NOTE — Patient Instructions (Signed)
Call if you would like to get a prescription for an inhaler  Keep track of your oxygen level when doing activity  Use your CPAP for the entire time you are asleep  Follow up in 6 months

## 2021-10-09 ENCOUNTER — Telehealth: Payer: Self-pay

## 2021-10-09 NOTE — Chronic Care Management (AMB) (Signed)
Chronic Care Management Pharmacy Assistant   Name: [name]  MRN: [id number] DOB: 10/27/1931   Reason for Encounter: Disease State   Conditions to be addressed/monitored: HTN   Recent office visits:  None ID  Recent consult visits:  10/07/21 Chesley Mires, MD-Pulmonary Disease (Chronic respiratory failure with hypoxia) no med changes  09/14/21 Philemon Kingdom, MD-Internal Medicine (Poorly controlled type 2 diabetes mellitus with circulatory disorder) Semaglutide,0.25 or 0.5MG /DOS, (OZEMPIC, 0.25 OR 0.5 MG/DOSE,) 2 MG/1.5ML [facility] visits:  None in previous 6 months  Medications: Outpatient Encounter Medications as of 10/09/2021  Medication Sig Note   acetaminophen (TYLENOL) 325 MG tablet Take 2 tablets (650 mg total) by mouth every 6 (six) hours as needed for mild pain or headache.    amLODipine (NORVASC) 2.5 MG tablet Take 1 tablet (2.5 mg total) by mouth daily. May take extra 2.5 mg for systolic BP > 696    carvedilol (COREG) 6.25 MG tablet TAKE 1 TABLET BY MOUTH  TWICE DAILY WITH MEALS    ELIQUIS 5 MG TABS tablet TAKE 1 TABLET BY MOUTH  TWICE DAILY    glucose blood (ONETOUCH VERIO) test strip 1 each by Other route 2 (two) times daily. And lancets 2/day    Insulin Lispro Prot & Lispro (HUMALOG MIX 75/25 KWIKPEN) (75-25) 100 UNIT/ML Kwikpen 32 units with breakfast and 6 units with dinner.    Lancet Devices (LANCING DEVICE) MISC Use as advised - for Delica Lancets    Lancets (ONETOUCH DELICA PLUS [id number]) MISC USE TO MONITOR GLUCOSE  LEVELS TWICE DAILY    Multiple Vitamins-Minerals (CENTRUM SILVER ULTRA WOMENS PO) Take 1 tablet by mouth daily.    nitroGLYCERIN (NITROSTAT) 0.4 MG SL tablet Place 1 tablet (0.4 mg total) under the tongue every 5 (five) minutes as needed for chest pain. Reported on 01/28/2016 03/10/2021: 03/10/21: Reports has not needed recently   NOVOFINE PLUS PEN NEEDLE 32G X 4 MM MISC SMARTSIG:1 Each SUB-Q Daily    OXYGEN Inhale 3 % into the  lungs continuous.    potassium chloride SA (KLOR-CON) 20 MEQ tablet TAKE 2 TABLETS BY MOUTH  DAILY    Semaglutide,0.25 or 0.5MG /DOS, (OZEMPIC, 0.25 OR 0.5 MG/DOSE,) 2 MG/1.5ML SOPN Inject 0.5 mg into the skin once a week.    torsemide (DEMADEX) 20 MG tablet TAKE 2 TABLETS BY MOUTH  TWICE DAILY    No facility-administered encounter medications on file as of 10/09/2021.   Reviewed chart prior to disease state call. Spoke with patient regarding BP  Recent Office Vitals: BP Readings from Last 3 Encounters:  10/07/21 (!) 142/62  09/14/21 140/60  06/25/21 (!) 168/62   Pulse Readings from Last 3 Encounters:  10/07/21 62  09/14/21 74  06/25/21 70    Wt Readings from Last 3 Encounters:  10/07/21 168 lb 9.6 oz (76.5 kg)  09/14/21 172 lb 3.2 oz (78.1 kg)  06/18/21 168 lb 3.2 oz (76.3 kg)     Kidney Function Lab Results  Component Value Date/Time   CREATININE 0.84 02/05/2021 02:15 AM   CREATININE 0.79 02/04/2021 02:55 AM   GFR 53.03 (L) 02/20/2020 12:39 PM   GFRNONAA >60 02/05/2021 02:15 AM   GFRAA 46 (L) 06/09/2020 11:33 AM    BMP Latest Ref Rng & Units 02/05/2021 02/04/2021 02/04/2021  Glucose 70 - 99 mg/dL 183(H) - -  BUN 8 - 23 mg/dL 16 - -  Creatinine 0.44 - 1.00 mg/dL 0.84 - -  Sodium 135 - 145 mmol/L 139 141 144  Potassium 3.5 - 5.1 mmol/L 3.7 3.6 3.3(L)  Chloride 98 - 111 mmol/L 92(L) - -  CO2 22 - 32 mmol/L 37(H) - -  Calcium 8.9 - 10.3 mg/dL 9.7 - -    Current antihypertensive regimen:  Amlodipine 2.5 mg Carvedilol 6.25 mg How often are you checking your Blood Pressure? weekly Current home BP readings: 140/70 this morning What recent interventions/DTPs have been made by any provider to improve Blood Pressure control since last CPP Visit: none Any recent hospitalizations or ED visits since last visit with CPP? No What diet changes have been made to improve Blood Pressure Control?  Patient states that she eats a heart healthy diet What exercise is being done to improve  your Blood Pressure Control?  Patient states that she exercises consistently everyday  Adherence Review: Is the patient currently on ACE/ARB medication? No Does the patient have >5 day gap between last estimated fill dates? No  Care Gaps: Colonoscopy- Diabetic Foot Exam-03/24/21 Mammogram-NA Ophthalmology-NA Dexa Scan - 02/16/17 Annual Well Visit - NA Micro albumin-07/31/18 Hemoglobin A1c- 09/14/21  Star Rating Drugs: Ozempic last fill 09/14/21  Ethelene Hal Clinical Pharmacist Assistant [phone]

## 2021-10-21 ENCOUNTER — Other Ambulatory Visit: Payer: Self-pay

## 2021-10-21 ENCOUNTER — Encounter: Payer: Self-pay | Admitting: Internal Medicine

## 2021-10-21 ENCOUNTER — Ambulatory Visit (INDEPENDENT_AMBULATORY_CARE_PROVIDER_SITE_OTHER): Payer: Medicare Other | Admitting: Internal Medicine

## 2021-10-21 VITALS — BP 140/60 | HR 82 | Ht 63.0 in | Wt 168.2 lb

## 2021-10-21 DIAGNOSIS — Z794 Long term (current) use of insulin: Secondary | ICD-10-CM

## 2021-10-21 DIAGNOSIS — E782 Mixed hyperlipidemia: Secondary | ICD-10-CM

## 2021-10-21 DIAGNOSIS — E669 Obesity, unspecified: Secondary | ICD-10-CM | POA: Diagnosis not present

## 2021-10-21 DIAGNOSIS — E1122 Type 2 diabetes mellitus with diabetic chronic kidney disease: Secondary | ICD-10-CM | POA: Diagnosis not present

## 2021-10-21 DIAGNOSIS — N1831 Chronic kidney disease, stage 3a: Secondary | ICD-10-CM | POA: Diagnosis not present

## 2021-10-21 NOTE — Progress Notes (Signed)
Patient ID: [name], female   DOB: 03-10-1931, 87 y.o.   MRN: [id number]  This visit occurred during the SARS-CoV-2 public health emergency.  Safety protocols were in place, including screening questions prior to the visit, additional usage of staff PPE, and extensive cleaning of exam room while observing appropriate contact time as indicated for disinfecting solutions.   HPI: EDLA PARA is a 87 y.o.-year-old female, self-referred (her daughter is also my patient), for management of DM2, dx in 1994, insulin-dependent, uncontrolled, with complications (CAD, s/p NSTEMI 2008; CHF; PAD; atrial fibrillation/atrial flutter; peripheral neuropathy; CKD stage IIIa).  She previously saw Dr. Loanne Drilling but daughter wanted her to start seeing me.  Last office visit with Dr. Loanne Drilling: 05/2021.  Last visit with me 1 month ago.  Patient's daughter accompanies her today and offers information about patient's diet, activity, and insulin doses.  Interim history: No increased urination, blurry vision, nausea, chest pain.   She did lose weight since starting Ozempic at last visit.  She has no GI side effects from this.  She just increased the dose to 0.5 mg weekly.  Reviewed HbA1c: Lab Results  Component Value Date   HGBA1C 9.0 (A) 09/14/2021   HGBA1C 8.3 (A) 05/28/2021   HGBA1C 8.0 (A) 03/24/2021   HGBA1C 9.5 (A) 12/02/2020   HGBA1C 7.8 (A) 08/21/2020   HGBA1C 8.3 (H) 05/15/2020   HGBA1C 8.0 (A) 05/06/2020   HGBA1C 9.2 (H) 02/20/2020   HGBA1C 7.9 (A) 11/14/2019   HGBA1C 7.6 (A) 08/22/2019   Pt is on a regimen of: - Humalog 75/25 >> 70/30 Novolin (24$ at [facility], w/o Rx) 32 units before breakfast and 6 units before dinner >> 28-30 units before breakfast >> 12 units 2x a day before meals (changed 2 weeks ago by my colleague) - Ozempic 0.25 >> 0.5 mg weekly-added 08/2021  Pt checks her sugars 2-4x a day and they are: - am: 84-195, 254 >> 109-153, 171, 181 - 2h after b'fast: n/c >> 154 - before  lunch: 229, 362 >> 125-194, 220 - 2h after lunch: n/c >>  - before dinner: 130 >> 114-192, 269 - 2h after dinner: 74, 116 >> 134, 223-230 - bedtime: n/c >> 265 >> 112-169, 270 - nighttime: n/c Lowest sugar was 64 >> 51; she has hypoglycemia awareness at 70.  Highest sugar was 359 >> 270.  Glucometer: One Touch Verio IQ  Pt's meals are: - Breakfast: frozen banana, berries, oatmeal, protein powder But she has lunch and dinner in the cafeteria ([place]):   -+ History of CKD stage IIIa, last BUN/creatinine:  Lab Results  Component Value Date   BUN 16 02/05/2021   BUN 16 02/04/2021   CREATININE 0.84 02/05/2021   CREATININE 0.79 02/04/2021  She is not on an ACE inhibitor/ARB  -+ HL; last set of lipids: Lab Results  Component Value Date   CHOL 164 05/16/2020   HDL 50 05/16/2020   LDLCALC 96 05/16/2020   LDLDIRECT 144.6 02/03/2010   TRIG 90 05/16/2020   CHOLHDL 3.3 05/16/2020  She had muscle weakness from statins.  - last eye exam was in 05/2018. No DR.   - no numbness and tingling in her feet.  Pt has FH of DM in mother.  She also has a history of HTN, osteoporosis, protein-calorie malnutrition, iron deficiency anemia.  No FH of MTC, no pancreatitis hx.  ROS: + See HPI  Past Medical History:  Diagnosis Date   Anemia    iron defficiency  Anginal pain ([place])    Arthritis    "knees, feet, hands; joints" (05/27/2015)   Asthma    Atrial fibrillation or flutter    s/p RFCA 7/08;   s/p DCCV in past;   previously on amiodarone;  amio stopped due to lung toxicity   CAD (coronary artery disease)    s/p NSTEMI tx with BMS to OM1 3/08;  cath 3/08: pOM 99% tx with PCI, pLAD 20%, ? mod stenosis at the AM   CAP (community acquired pneumonia) 05/24/2015   CHF (congestive heart failure) ([place])    Chronic diastolic heart failure ([place])    echo 11/11:  EF 55-60%, severe LVH, mod LAE, mild MR, mildly increased PASP   COPD (chronic obstructive pulmonary disease) (H[facility])     Degenerative joint disease    Dystrophy, corneal stromal    Heart murmur    HLD (hyperlipidemia)    HTN (hypertension)    essential nos   Hypopotassemia    PMH of   Muscle pain    Myocardial infarction ([place]) 2008   OSA on CPAP    Osteoporosis    Pneumonia 05/27/2015   Protein calorie malnutrition ([facility])    Rash and nonspecific skin eruption    both arms,awaiting bio   dr Delman Cheadle   Seasonal allergies    Shortness of breath dyspnea    Type II diabetes mellitus ([place])    Dr Loanne Drilling   Past Surgical History:  Procedure Laterality Date   A FLUTTER ABLATION     Dr Lovena Le   ABDOMINAL AORTOGRAM W/LOWER EXTREMITY Bilateral 04/11/2020   Procedure: ABDOMINAL AORTOGRAM W/LOWER EXTREMITY;  Surgeon: Angelia Mould, MD;  Location: [facility];  Service: Cardiovascular;  Laterality: Bilateral;   CATARACT EXTRACTION W/ INTRAOCULAR LENS  IMPLANT, BILATERAL Bilateral    CHOLECYSTECTOMY     COLONOSCOPY  6/07   2 polyps Dr. Kinnie Feil in [place]   colonoscopy with polypectomy      X 2; [facility]   CORNEAL TRANSPLANT Bilateral    CORONARY ANGIOPLASTY WITH STENT PLACEMENT  02/2007   BMS w/Dr Lovena Le   CORONARY ATHERECTOMY N/A 05/16/2020   Procedure: CORONARY ATHERECTOMY;  Surgeon: Martinique, Peter M, MD;  Location: [facility];  Service: Cardiovascular;  Laterality: N/A;   CORONARY BALLOON ANGIOPLASTY N/A 05/16/2020   Procedure: CORONARY BALLOON ANGIOPLASTY;  Surgeon: Martinique, Peter M, MD;  Location: [facility];  Service: Cardiovascular;  Laterality: N/A;   CORONARY STENT INTERVENTION N/A 05/16/2020   Procedure: CORONARY STENT INTERVENTION;  Surgeon: Martinique, Peter M, MD;  Location: [facility];  Service: Cardiovascular;  Laterality: N/A;   EYE SURGERY     gravid 2 para 2     INTRAVASCULAR ULTRASOUND/IVUS N/A 05/16/2020   Procedure: Intravascular Ultrasound/IVUS;  Surgeon: Martinique, Peter M, MD;  Location: [facility];  Service: Cardiovascular;  Laterality: N/A;   KNEE  CARTILAGE SURGERY Right    LEFT AND RIGHT HEART CATHETERIZATION WITH CORONARY ANGIOGRAM N/A 05/07/2014   Procedure: LEFT AND RIGHT HEART CATHETERIZATION WITH CORONARY ANGIOGRAM;  Surgeon: Peter M Martinique, MD;  Location: [facility];  Service: Cardiovascular;  Laterality: N/A;   PERIPHERAL VASCULAR INTERVENTION  04/11/2020   Procedure: PERIPHERAL VASCULAR INTERVENTION;  Surgeon: Angelia Mould, MD;  Location: [facility];  Service: Cardiovascular;;   RIGHT HEART CATH N/A 02/04/2021   Procedure: RIGHT HEART CATH;  Surgeon: Jolaine Artist, MD;  Location: [facility];  Service: Cardiovascular;  Laterality:  N/A;   RIGHT/LEFT HEART CATH AND CORONARY ANGIOGRAPHY N/A 05/16/2020   Procedure: RIGHT/LEFT HEART CATH AND CORONARY ANGIOGRAPHY;  Surgeon: Jolaine Artist, MD;  Location: [facility];  Service: Cardiovascular;  Laterality: N/A;   TOTAL KNEE ARTHROPLASTY Right 06/28/2016   Procedure: TOTAL KNEE ARTHROPLASTY;  Surgeon: Frederik Pear, MD;  Location: [facility];  Service: Orthopedics;  Laterality: Right;   Social History   Socioeconomic History   Marital status: Widowed    Spouse name: Not on file   Number of children: 2   Years of education: 16   Highest education level: Bachelor's degree (e.g., BA, AB, BS)  Occupational History   Occupation: Teacher/ Retired  Tobacco Use   Smoking status: Former    Packs/day: 0.25    Years: 18.00    Pack years: 4.50    Types: Cigarettes    Quit date: 12/27/1968    Years since quitting: 52.8   Smokeless tobacco: Never   Tobacco comments:    smoked [place], up to 1 pp week  Vaping Use   Vaping Use: Never used  Substance and Sexual Activity   Alcohol use: Yes    Comment: occ   Drug use: No   Sexual activity: Never  Other Topics Concern   Not on file  Social History Narrative   Teacher. Widowed. Rarely drinks cafeine.    Social Determinants of Health   Financial Resource Strain: Low Risk    Difficulty of Paying Living  Expenses: Not hard at all  Food Insecurity: No Food Insecurity   Worried About Charity fundraiser in the Last Year: Never true   [place] in the Last Year: Never true  Transportation Needs: No Transportation Needs   Lack of Transportation (Medical): No   Lack of Transportation (Non-Medical): No  Physical Activity: Sufficiently Active   Days of Exercise per Week: 5 days   Minutes of Exercise per Session: 30 min  Stress: No Stress Concern Present   Feeling of Stress : Not at all  Social Connections: Moderately Integrated   Frequency of Communication with Friends and Family: More than three times a week   Frequency of Social Gatherings with Friends and Family: More than three times a week   Attends Religious Services: More than 4 times per year   Active Member of [employer] or Organizations: Yes   Attends Archivist Meetings: More than 4 times per year   Marital Status: Widowed  Human resources officer Violence: Not At Risk   Fear of Current or Ex-Partner: No   Emotionally Abused: No   Physically Abused: No   Sexually Abused: No   Current Outpatient Medications on File Prior to Visit  Medication Sig Dispense Refill   acetaminophen (TYLENOL) 325 MG tablet Take 2 tablets (650 mg total) by mouth every 6 (six) hours as needed for mild pain or headache. 20 tablet 0   amLODipine (NORVASC) 2.5 MG tablet Take 1 tablet (2.5 mg total) by mouth daily. May take extra 2.5 mg for systolic BP > 003 704 tablet 3   carvedilol (COREG) 6.25 MG tablet TAKE 1 TABLET BY MOUTH  TWICE DAILY WITH MEALS 180 tablet 3   ELIQUIS 5 MG TABS tablet TAKE 1 TABLET BY MOUTH  TWICE DAILY 60 tablet 11   glucose blood (ONETOUCH VERIO) test strip 1 each by Other route 2 (two) times daily. And lancets 2/day 200 strip 3   Insulin Lispro Prot & Lispro (HUMALOG MIX 75/25 KWIKPEN) (  75-25) 100 UNIT/ML Kwikpen 32 units with breakfast and 6 units with dinner. 15 mL 11   Lancet Devices (LANCING DEVICE) MISC Use as advised - for  Delica Lancets 1 each 0   Lancets (ONETOUCH DELICA PLUS QIONGE95M) MISC USE TO MONITOR GLUCOSE  LEVELS TWICE DAILY 200 each 3   Multiple Vitamins-Minerals (CENTRUM SILVER ULTRA WOMENS PO) Take 1 tablet by mouth daily.     nitroGLYCERIN (NITROSTAT) 0.4 MG SL tablet Place 1 tablet (0.4 mg total) under the tongue every 5 (five) minutes as needed for chest pain. Reported on 01/28/2016 30 tablet 3   NOVOFINE PLUS PEN NEEDLE 32G X 4 MM MISC SMARTSIG:1 Each SUB-Q Daily 200 each 1   OXYGEN Inhale 3 % into the lungs continuous.     potassium chloride SA (KLOR-CON) 20 MEQ tablet TAKE 2 TABLETS BY MOUTH  DAILY 180 tablet 3   Semaglutide,0.25 or 0.5MG /DOS, (OZEMPIC, 0.25 OR 0.5 MG/DOSE,) 2 MG/1.5ML SOPN Inject 0.5 mg into the skin once a week. 4.5 mL 3   torsemide (DEMADEX) 20 MG tablet TAKE 2 TABLETS BY MOUTH  TWICE DAILY 120 tablet 11   No current facility-administered medications on file prior to visit.   Allergies  Allergen Reactions   Levaquin [Levofloxacin] Shortness Of Breath and Swelling    angioedema   Lobster [Shellfish Allergy] Other (See Comments)    angioedema   Penicillins Rash    Has patient had a PCN reaction causing immediate rash, facial/tongue/throat swelling, SOB or lightheadedness with hypotension: Yes Has patient had a PCN reaction causing severe rash involving mucus membranes or skin necrosis: No Has patient had a PCN reaction that required hospitalization: No Has patient had a PCN reaction occurring within the last 10 years: No If all of the above answers are "NO", then may proceed with Cephalosporin use.   Valsartan Other (See Comments)    REACTION: angioedema   Codeine Other (See Comments)    Mental status changes   Lipitor [Atorvastatin] Other (See Comments)    weakness   Oxycodone Other (See Comments)    hallucinations   Statins     Made her too weak   Tramadol    Amlodipine Besylate Other (See Comments)    REACTION: tingling in lips & gum edema 7/12: talked to  patient, states she is tolerating well   Crestor [Rosuvastatin Calcium] Rash   Tramadol Hcl Nausea And Vomiting   Family History  Problem Relation Age of Onset   Diabetes Mother    Hypertension Mother    Transient ischemic attack Mother    Arthritis Mother    Heart attack Father 41   Arthritis Father    Hypertension Father    Breast cancer Maternal Aunt    Arthritis Maternal Aunt    PE: BP 140/60 (BP Location: Right Arm, Patient Position: Sitting, Cuff Size: Normal)   Pulse 82   Ht 5\' 3"  (1.6 m)   Wt 168 lb 3.2 oz (76.3 kg)   SpO2 91%   BMI 29.80 kg/m  Wt Readings from Last 3 Encounters:  10/21/21 168 lb 3.2 oz (76.3 kg)  10/07/21 168 lb 9.6 oz (76.5 kg)  09/14/21 172 lb 3.2 oz (78.1 kg)   Constitutional: overweight, in NAD Eyes: PERRLA, EOMI, no exophthalmos ENT: moist mucous membranes, no thyromegaly, no cervical lymphadenopathy Cardiovascular: RRR, No MRG, B pitting LE edema as well as compression hoses Respiratory: CTA B Gastrointestinal: abdomen soft, NT, ND, BS+ Musculoskeletal: no deformities, strength intact in all 4 Skin: moist,  warm, + stasis dermatitis rash bilateral feet Neurological: no tremor with outstretched hands, DTR normal in all 4  ASSESSMENT: 1. DM2, insulin-dependent, uncontrolled, with complications - CAD, s/p NSTEMI 2008 - CHF - PAD - atrial fibrillation/atrial flutter -previously on amiodarone - peripheral neuropathy - CKD stage IIIa  2. Obesity class 1  3. HL  PLAN:  1. Patient with longstanding, uncontrolled, type 2 diabetes, on a premixed insulin regimen, to which we added a weekly GLP-1 receptor agonist at last visit.  At that time, HbA1c was higher, at 9.0%. -We discussed at that time about continuing Novolin 70/30 while we were adding Ozempic but if sugars did not improve significantly, to change to a basal-bolus insulin regimen.  At that time, she was having sugars in the 200s and 341D after certain meals.  At last visit I also  made specific recommendations to improve diet.  We discussed about benefits and possible side effects from GLP-1 receptor agonist and decided to start on the lower dose of Ozempic and increase as tolerated.  She tolerates this well.  In the future, we can also use SGLT2 inhibitors especially with her history of CHF. -At today's visit, her sugars appear to have improved from before even though she just increase the dose of Ozempic to 0.5 mg weekly in the last few days.  She tolerates this well.  She did have to decrease the dose of insulin since last visit and sugars started to improve.  Sugars appear to be much better in the morning but they are still high before and after dinner.  We discussed about increasing the dose of premixed insulin in the morning but at night, before dinner, I would not suggest any increasing dose to avoid low blood sugars overnight, but I would suggest to move the insulin dose 30 minutes before dinner, if possible, since she is not taking it either before or after she starts eating.  Also, sugars will most likely improve after she stops protein shakes between meals. - I suggested to:  Patient Instructions  Please increase: - Novolin 70/30 16 units 30 min before breakfast and 12 units 30 min before dinner  Continue: - Ozempic 0.5 mg weekly  Please return in 2 months with your sugar log.   - discussed about CBG targets for treatment: 80-150 mg/dL before meals and <180-200 mg/dL after meals; target HbA1c <7.4% (due to age). - we will check another HbA1c at next visit - advised to check sugars at different times of the day - 2-4x a day, rotating check times - advised for yearly eye exams >> she is not UTD but has an appointment coming up - return to clinic in 2 months  2.  Obesity class I -At last visit, I suggested changes in her diet including stopping protein shakes as snacks, also, we discussed about intake of fresh fruit, dried fruit, and other snacks.  I also  recommended to try to switch from smoothies in the morning to oatmeal. -She is still occasionally drinking a protein shake between meals and I again advised her to stop these. -At last visit we also started Ozempic, which should also help with weight loss -She lost 4 pounds since last visit  3. HL - Reviewed latest lipid panel: LDL above goal of less than 70, the rest of the fractions at goal Lab Results  Component Value Date   CHOL 164 05/16/2020   HDL 50 05/16/2020   LDLCALC 96 05/16/2020   LDLDIRECT 144.6 02/03/2010  TRIG 90 05/16/2020   CHOLHDL 3.3 05/16/2020  -She is not on a statin due to previous muscle weakness -She is due for another lipid panel.  We will check this at next visit after her diabetes improves more.  4.  Statin intolerance -She had previous muscle weakness with statins (see above).  Philemon Kingdom, MD PhD [facility] Endocrinology

## 2021-10-21 NOTE — Patient Instructions (Addendum)
Please increase: - Novolin 70/30 16 units 30 min before breakfast and 12 units 30 min before dinner  Continue: - Ozempic 0.5 mg weekly  Please return in 2 months with your sugar log.

## 2021-10-26 DIAGNOSIS — N1831 Chronic kidney disease, stage 3a: Secondary | ICD-10-CM | POA: Diagnosis not present

## 2021-10-26 DIAGNOSIS — I5032 Chronic diastolic (congestive) heart failure: Secondary | ICD-10-CM

## 2021-10-26 DIAGNOSIS — Z794 Long term (current) use of insulin: Secondary | ICD-10-CM

## 2021-10-26 DIAGNOSIS — E1122 Type 2 diabetes mellitus with diabetic chronic kidney disease: Secondary | ICD-10-CM | POA: Diagnosis not present

## 2021-10-27 ENCOUNTER — Ambulatory Visit (INDEPENDENT_AMBULATORY_CARE_PROVIDER_SITE_OTHER): Payer: Medicare Other | Admitting: *Deleted

## 2021-10-27 DIAGNOSIS — E1122 Type 2 diabetes mellitus with diabetic chronic kidney disease: Secondary | ICD-10-CM

## 2021-10-27 DIAGNOSIS — I5032 Chronic diastolic (congestive) heart failure: Secondary | ICD-10-CM

## 2021-10-27 NOTE — Chronic Care Management (AMB) (Signed)
Chronic Care Management   CCM RN Visit Note  10/27/2021 Name: [name] MRN: [id number] DOB: [birth year]  Subjective: [name] is a 87 y.o. year old female who is a primary care patient of Burns, [name], MD. The care management team was consulted for assistance with disease management and care coordination needs.    Engaged with patient by telephone for follow up visit in response to provider referral for case management and/or care coordination services.   Consent to Services:  The patient was given information about Chronic Care Management services, agreed to services, and gave verbal consent prior to initiation of services.  Please see initial visit note for detailed documentation.  Patient agreed to services and verbal consent obtained.   Assessment: Review of patient past medical history, allergies, medications, health status, including review of consultants reports, laboratory and other test data, was performed as part of comprehensive evaluation and provision of chronic care management services.  CCM Care Plan  Allergies  Allergen Reactions   Levaquin [Levofloxacin] Shortness Of Breath and Swelling    angioedema   Lobster [Shellfish Allergy] Other (See Comments)    angioedema   Penicillins Rash    Has patient had a PCN reaction causing immediate rash, facial/tongue/throat swelling, SOB or lightheadedness with hypotension: Yes Has patient had a PCN reaction causing severe rash involving mucus membranes or skin necrosis: No Has patient had a PCN reaction that required hospitalization: No Has patient had a PCN reaction occurring within the last 10 years: No If all of the above answers are "NO", then may proceed with Cephalosporin use.   Valsartan Other (See Comments)    REACTION: angioedema   Codeine Other (See Comments)    Mental status changes   Lipitor [Atorvastatin] Other (See Comments)    weakness   Oxycodone Other (See Comments)    hallucinations    Statins     Made her too weak   Tramadol    Amlodipine Besylate Other (See Comments)    REACTION: tingling in lips & gum edema 7/12: talked to patient, states she is tolerating well   Crestor [Rosuvastatin Calcium] Rash   Tramadol Hcl Nausea And Vomiting   Outpatient Encounter Medications as of 10/27/2021  Medication Sig Note   acetaminophen (TYLENOL) 325 MG tablet Take 2 tablets (650 mg total) by mouth every 6 (six) hours as needed for mild pain or headache.    amLODipine (NORVASC) 2.5 MG tablet Take 1 tablet (2.5 mg total) by mouth daily. May take extra 2.5 mg for systolic BP > 737    carvedilol (COREG) 6.25 MG tablet TAKE 1 TABLET BY MOUTH  TWICE DAILY WITH MEALS    ELIQUIS 5 MG TABS tablet TAKE 1 TABLET BY MOUTH  TWICE DAILY    glucose blood (ONETOUCH VERIO) test strip 1 each by Other route 2 (two) times daily. And lancets 2/day    Insulin Lispro Prot & Lispro (HUMALOG MIX 75/25 KWIKPEN) (75-25) 100 UNIT/ML Kwikpen 32 units with breakfast and 6 units with dinner.    Lancet Devices (LANCING DEVICE) MISC Use as advised - for Delica Lancets    Lancets (ONETOUCH DELICA PLUS [id number]) MISC USE TO MONITOR GLUCOSE  LEVELS TWICE DAILY    Multiple Vitamins-Minerals (CENTRUM SILVER ULTRA WOMENS PO) Take 1 tablet by mouth daily.    nitroGLYCERIN (NITROSTAT) 0.4 MG SL tablet Place 1 tablet (0.4 mg total) under the tongue every 5 (five) minutes as needed for chest pain. Reported on 01/28/2016 03/10/2021: 03/10/21: Reports  has not needed recently   NOVOFINE PLUS PEN NEEDLE 32G X 4 MM MISC SMARTSIG:1 Each SUB-Q Daily    OXYGEN Inhale 3 % into the lungs continuous.    potassium chloride SA (KLOR-CON) 20 MEQ tablet TAKE 2 TABLETS BY MOUTH  DAILY    Semaglutide,0.25 or 0.5MG/DOS, (OZEMPIC, 0.25 OR 0.5 MG/DOSE,) 2 MG/1.5ML SOPN Inject 0.5 mg into the skin once a week.    torsemide (DEMADEX) 20 MG tablet TAKE 2 TABLETS BY MOUTH  TWICE DAILY    No facility-administered encounter medications on file as of  10/27/2021.   Patient Active Problem List   Diagnosis Date Noted   Chronic respiratory failure with hypoxia ([place]) 05/12/2021   Acute respiratory failure with hypoxia and hypercapnia (HCC) 01/31/2021   Elevated troponin 01/31/2021   Poorly controlled type 2 diabetes mellitus with circulatory disorder ([place]) 01/31/2021   Neuropathy 08/19/2020   PAD (peripheral artery disease) ([place]) 02/29/2020   Chronic kidney disease (CKD) 02/20/2020   Osteoarthritis 08/01/2019   Arthritis of left sacroiliac joint 05/04/2017   Osteopenia 02/17/2017   Piriformis syndrome of left side 02/16/2017   Acquired leg length discrepancy 02/16/2017   COPD GOLD I  01/27/2017   Lumbar radiculopathy, acute 01/27/2017   Granuloma annulare 07/27/2016   Numbness of fingers 07/27/2016   Primary osteoarthritis of right knee 06/27/2016   Rash and nonspecific skin eruption 06/09/2016   Diabetes ([place]) 03/07/2016   Acute on chronic combined systolic (congestive) and diastolic (congestive) heart failure (HCC)    OSA  08/28/2014   Chronic diastolic heart failure ([place]) 07/30/2014   Nocturnal hypoxemia 07/09/2014   SOB (shortness of breath) 05/15/2014   Pulmonary HTN ([place]) 05/15/2014   Band keratopathy 01/16/2014   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   Hyperthyroidism 11/04/2011   Multinodular goiter 10/14/2011   Atrophy, Fuchs' 09/29/2011   ATRIAL FIBRILLATION 06/03/2010   VITILIGO 11/07/2009   SINOATRIAL NODE DYSFUNCTION 06/16/2009   Atrial flutter ([place]) 06/25/2008   HYPERLIPIDEMIA 01/29/2008   Coronary atherosclerosis 01/29/2008   HTN (hypertension) 10/30/2007   Conditions to be addressed/monitored:  CHF and DMII  Care Plan : Heart Failure (Adult)  Updates made by Knox Royalty, RN since 10/27/2021 12:00 AM     Problem: Symptom Exacerbation (Heart Failure)   Priority: High     Long-Range Goal: Symptom Exacerbation Prevented or Minimized   Start Date: 02/17/2021  Expected End Date: 12/25/2021  This  Visit's Progress: On track  Recent Progress: On track  Priority: Medium  Note:   Current Barriers:  Knowledge deficits related to use of new home Oxygen and need for ongoing reinforcement of basic heart failure pathophysiology and self care management Unable to independently verbalize long-term plan for use of home O2, newly prescribed Nurse Case Manager Clinical Goal(s):  Over the next 8 months, patient will continue following heart failure action plan as evidenced by patient reporting during CCM RN CM outreach of: Monitoring/ recording daily weight at home Using home O2 with activity and CPAP at night Following action plan for development of signs/ symptoms CHF exacerbation/ yellow CHF zone  10/27/21- Care plan completed due to duplicate goals; new care plan developed and goals re-established/ extended     Care Plan : Diabetes Type 2 (Adult)  Updates made by Knox Royalty, RN since 10/27/2021 12:00 AM     Problem: Glycemic Management (Diabetes, Type 2)   Priority: High     Long-Range Goal: Glycemic Management Optimized   Start Date: 02/17/2021  Expected End  Date: 12/25/2021  This Visit's Progress: On track  Recent Progress: On track  Priority: Medium  Note:   Objective:  Lab Results  Component Value Date   HGBA1C 9.5 (A) 12/02/2020   Lab Results  Component Value Date   CREATININE 0.84 02/05/2021   CREATININE 0.79 02/04/2021   CREATININE 0.84 02/03/2021   No results found for: EGFR Current Barriers:  Knowledge Deficits related to basic Diabetes pathophysiology and self care/management: patient could benefit from ongoing reinforcement of self-health management strategies for DM Knowledge Deficits related to medications used for management of diabetes- patient actively engaged with CCM Pharmacist at PCP office No self-care deficits: patient resides at independent living facility and has support if needed; patient verbalizes accurate general understanding of plan of care  post- recent hospital discharge 02/05/21 Case Manager Clinical Goal(s):  Over the next 8 months, patient will demonstrate ongoing adherence to prescribed treatment plan for diabetes self care/management as evidenced by:  Daily monitoring and recording of CBG twice daily  Adherence to ADA/ carb modified diet  Light exercise with established PT at [place] 4-5 days/week; walking when weather is nice  Adherence to prescribed medication regimen  Contacting care providers for new or worsened symptoms, concerns or questions  10/27/21- Care plan completed due to duplicate goals; new care plan developed and goals re-established/ extended     Care Plan : [place] of Care  Updates made by Knox Royalty, RN since 10/27/2021 12:00 AM     Problem: Chronic Disease Management Needs   Priority: High     Long-Range Goal: Development of plan of care for long term chronic disease management   Start Date: 10/27/2021  Expected End Date: 10/27/2022  This Visit's Progress: On track  Priority: High  Note:   Current Barriers:  Chronic Disease Management support and education needs related to CHF and DMII- patient appreciaitive of ongoing support and reinforcement Chef prepared meals at ALF- difficult to follow prescribed diet  10/27/21- Care plan converted to new format; goals updated/ re-established and extended today with completion of prior care plan  RNCM Clinical Goal(s):  Patient will demonstrate Ongoing health management independence DMII/ CHF  through collaboration with RN Care manager, provider, and care team.   Interventions: 1:1 collaboration with primary care provider regarding development and update of comprehensive plan of care as evidenced by provider attestation and co-signature Inter-disciplinary care team collaboration (see longitudinal plan of care) Evaluation of current treatment plan related to  self management and patient's adherence to plan as  established by provider  Diabetes Interventions: Assessed patient's understanding of A1c goal: <7% Provided education to patient about basic DM disease process; Discussed plans with patient for ongoing care management follow up and provided patient with direct contact information for care management team; Reviewed scheduled/upcoming provider appointments including: 12/17/21- CHF clinic; 12/25/21- endocrinology provider office visit; Review of patient status, including review of consultants reports, relevant laboratory and other test results, and medications completed; Reviewed recent endocrinology provider office visit- confirmed patient has good understanding of changes made to dosing of insulin- she is able to verbalize independently and reports accurate understanding of same Discussed patient's current difficulty affording Ozempic: reports she is currently working with endocrinology team at Dr. Arman Filter office on patient assistance; verbalizes plans to follow up with endocrinology team today: we discussed options around medications and I encouraged her to contact me if she would like CCM assistance with PAP; she declines today, as she is currently working with  endocrinology team-- reports is in donut hole and is completely out of Ozempic for this week Discussed and reiterated previously provided education around role of liver in overnight glucose metabolism Reviewed recent blood sugars at home: reports consistent fasting and post-prandial blood sugars between 102-150; reports fasting blood sugar this morning of "128;" reports no low blood sugars, reports "occasional" isolated high values of "200," but states those values are "rare" Confirmed she continues to remain active-- going to yoga this morning, walking on nice days Confirmed patient continues to try and follow carb-modified/ low sugar diet around chef prepared meals at ALF: discussed strategies she has tried and plans to continue trying-  positive reinforcement provided  Lab Results  Component Value Date   HGBA1C 9.0 (A) 09/14/2021  Heart Failure Interventions: Discussed importance of daily weight and advised patient to weigh and record daily; Discussed the importance of keeping all appointments with provider; Provided patient with education about the role of exercise in the management of heart failure; Reviewed recent pulmonary provider office visit- confirmed she obtained flu vaccine; reviewed visit details: she continues to use home O2 prn; continues monitoring SaO2 levels at home; reports pulmonary provider continues to work on assisting with acquisition of portable O2 tanks- she declines needing assistance with same Reviewed daily weights at home: she continues to report daily weights consistently at "164 lbs" Reviewed established action plan for signs/ symptoms CHF yellow zone: she verbalizes good general understanding and will benefit form ongoing support and reinforcement Discussed/ provided education around benefit of activity in setting of CHF/ Chronic Respiratory Failure with hypoxemia  Patient Goals/Self-Care Activities: Patient Henli will:  Self administer medications as prescribed Attend all scheduled provider appointments Call pharmacy for medication refills Call provider office for new concerns or questions Continue using home oxygen as prescribed and monitor your oxygen levels regularly at home: with activity and at rest, and if you ever start to feel unusually short of breath Continue to weigh daily: you reported weights at home today consistently at 164 lbs; You are on track with maintaining your goal weight- keep up the great work tracking weight in your health journal Call office if I gain more than 2 pounds in one day or 5 pounds in one week Watch for swelling in feet, ankles and legs every day- if you experience new/ unusual swelling that does not go away promptly, call your heart failure doctor Check  blood sugar at prescribed times, or if I feel it is too high or too low.  Keep up entering blood sugar readings and medication or insulin into daily log Take the blood sugar log to all doctor visits Continue your excellent efforts to follow heart healthy, low salt, low sugar/ carb modified diet-- you have great ideas around how to manage your diet for times that you do not want to eat the food that is prepared for you by your chef at [facility]; you're doing a good job controlling portion size: keep up the great work  Keep up the great work staying active; and walking for exercise when the weather is nice- keep up the great work Continue working with Dr. Arman Filter office team to obtain patient assistance for the medication (Ozempic) that is expensive Follow Up Plan:   Telephone follow up appointment with care management team member scheduled for:  Friday, November 27, 2021 at 9:00 am The patient has been provided with contact information for the care management team and has been advised to call with any health related  questions or concerns    Plan: Telephone follow up appointment with care management team member scheduled for:  Friday, November 27, 2021 at 9:00 am The patient has been provided with contact information for the care management team and has been advised to call with any health related questions or concerns  [name], RN, BSN, [place] [phone]: direct office [phone]: mobile

## 2021-10-27 NOTE — Patient Instructions (Addendum)
Visit [place], it was nice talking with you today.   I look forward to talking to you again for an update on Friday, November 27, 2021 at 9:00 am- please be listening out for my call that day.  I will call as close to 9:00 am as possible.   If you need to cancel or re-schedule our telephone visit, please call [phone] and one of our care guides will be happy to assist you.   I look forward to hearing about your progress.   Please don't hesitate to contact me if I can be of assistance to you before our next scheduled telephone appointment.   [name], RN, BSN, [facility] RN Care Coordination- [place] [phone]: direct office [phone]: mobile   Patient Self-Care Activities: Patient [name] will:  Self administer medications as prescribed Attend all scheduled provider appointments Call pharmacy for medication refills Call provider office for new concerns or questions Continue using home oxygen as prescribed and monitor your oxygen levels regularly at home: with activity and at rest, and if you ever start to feel unusually short of breath Continue to weigh daily: you reported weights at home today consistently at 164 lbs; You are on track with maintaining your goal weight- keep up the great work tracking weight in your health journal Call office if I gain more than 2 pounds in one day or 5 pounds in one week Watch for swelling in feet, ankles and legs every day- if you experience new/ unusual swelling that does not go away promptly, call your heart failure doctor Check blood sugar at prescribed times, or if you feel it is too high or too low.  Keep up entering blood sugar readings and medication or insulin into daily log Take the blood sugar log to all doctor visits Continue your excellent efforts to follow heart healthy, low salt, low sugar/ carb modified diet-- you have great ideas around how to manage your diet for times that  you do not want to eat the food that is prepared for you by your chef at [facility]; you're doing a good job controlling portion size: keep up the great work  Keep up the great work staying active; and walking for exercise when the weather is nice- keep up the great work Continue working with Dr. Arman Filter office team to obtain patient assistance for the medication (Ozempic) that is expensive  Exercising to Stay Healthy To become healthy and stay healthy, it is recommended that you do moderate-intensity and vigorous-intensity exercise. You can tell that you are exercising at a moderate intensity if your heart starts beating faster and you start breathing faster but can still hold a conversation. You can tell that you are exercising at a vigorous intensity if you are breathing much harder and faster and cannot hold a conversation while exercising. How can exercise benefit me? Exercising regularly is important. It has many health benefits, such as: Improving overall fitness, flexibility, and endurance. Increasing bone density. Helping with weight control. Decreasing body fat. Increasing muscle strength and endurance. Reducing stress and tension, anxiety, depression, or anger. Improving overall health. What guidelines should I follow while exercising? Before you start a new exercise program, talk with your health care provider. Do not exercise so much that you hurt yourself, feel dizzy, or get very short of breath. Wear comfortable clothes and wear shoes with good support. Drink plenty of water while you exercise to prevent dehydration or heat stroke. Work  out until your breathing and your heartbeat get faster (moderate intensity). How often should I exercise? Choose an activity that you enjoy, and set realistic goals. Your health care provider can help you make an activity plan that is individually designed and works best for you. Exercise regularly as told by your health care provider.  This may include: Doing strength training two times a week, such as: Lifting weights. Using resistance bands. Push-ups. Sit-ups. Yoga. Doing a certain intensity of exercise for a given amount of time. Choose from these options: A total of 150 minutes of moderate-intensity exercise every week. A total of 75 minutes of vigorous-intensity exercise every week. A mix of moderate-intensity and vigorous-intensity exercise every week. Children, pregnant women, people who have not exercised regularly, people who are overweight, and older adults may need to talk with a health care provider about what activities are safe to perform. If you have a medical condition, be sure to talk with your health care provider before you start a new exercise program. What are some exercise ideas? Moderate-intensity exercise ideas include: Walking 1 mile (1.6 km) in about 15 minutes. Biking. Hiking. Golfing. Dancing. Water aerobics. Vigorous-intensity exercise ideas include: Walking 4.5 miles (7.2 km) or more in about 1 hour. Jogging or running 5 miles (8 km) in about 1 hour. Biking 10 miles (16.1 km) or more in about 1 hour. Lap swimming. Roller-skating or in-line skating. Cross-country skiing. Vigorous competitive sports, such as football, basketball, and soccer. Jumping rope. Aerobic dancing. What are some everyday activities that can help me get exercise? Yard work, such as: Psychologist, educational. Raking and bagging leaves. Washing your car. Pushing a stroller. Shoveling snow. Gardening. Washing windows or floors. How can I be more active in my day-to-day activities? Use stairs instead of an elevator. Take a walk during your lunch break. If you drive, park your car farther away from your work or school. If you take public transportation, get off one stop early and walk the rest of the way. Stand up or walk around during all of your indoor phone calls. Get up, stretch, and walk around every 30  minutes throughout the day. Enjoy exercise with a friend. Support to continue exercising will help you keep a regular routine of activity. Where to find more information You can find more information about exercising to stay healthy from: U.S. Department of Health and Human Services: [employer].at Centers for Disease Control and Prevention (CDC): http://www.wolf.info/ Summary Exercising regularly is important. It will improve your overall fitness, flexibility, and endurance. Regular exercise will also improve your overall health. It can help you control your weight, reduce stress, and improve your bone density. Do not exercise so much that you hurt yourself, feel dizzy, or get very short of breath. Before you start a new exercise program, talk with your health care provider. This information is not intended to replace advice given to you by your health care provider. Make sure you discuss any questions you have with your health care provider. Document Revised: 04/10/2021 Document Reviewed: 04/10/2021 Elsevier Patient Education  [place] patient verbalized understanding of instructions, educational materials, and care plan provided today and agreed to receive a mailed copy of patient instructions, educational materials, and care plan.  Telephone follow up appointment with care management team member scheduled for:  Friday, November 27, 2021 at 9:00 am The patient has been provided with contact information for the care management team and has been advised to call with any health  related questions or concerns.   [name], RN, BSN, [facility] RN Care Coordination- [place] [phone]: direct office [phone]: mobile

## 2021-10-28 ENCOUNTER — Telehealth: Payer: Self-pay | Admitting: Internal Medicine

## 2021-10-28 NOTE — Telephone Encounter (Signed)
Pt currently taking Ozepmic and cannot afford the $243 co-pay. Would be out of the "doughnut-hole" in January and can start affording the medication. Would like to apply for assistance in the meantime. Please mail info to address. Pt contact [phone].

## 2021-10-28 NOTE — Telephone Encounter (Signed)
Tried calling pt back and was unable to get through the line. Mychart message sent to pt advising application for patient assistance will be mailed but she can also get the app off line and print it.

## 2021-11-05 DIAGNOSIS — G4733 Obstructive sleep apnea (adult) (pediatric): Secondary | ICD-10-CM | POA: Diagnosis not present

## 2021-11-05 DIAGNOSIS — J9602 Acute respiratory failure with hypercapnia: Secondary | ICD-10-CM | POA: Diagnosis not present

## 2021-11-05 DIAGNOSIS — J9601 Acute respiratory failure with hypoxia: Secondary | ICD-10-CM | POA: Diagnosis not present

## 2021-11-05 DIAGNOSIS — I5033 Acute on chronic diastolic (congestive) heart failure: Secondary | ICD-10-CM | POA: Diagnosis not present

## 2021-11-06 DIAGNOSIS — G4733 Obstructive sleep apnea (adult) (pediatric): Secondary | ICD-10-CM | POA: Diagnosis not present

## 2021-11-06 DIAGNOSIS — J9601 Acute respiratory failure with hypoxia: Secondary | ICD-10-CM | POA: Diagnosis not present

## 2021-11-06 DIAGNOSIS — I5033 Acute on chronic diastolic (congestive) heart failure: Secondary | ICD-10-CM | POA: Diagnosis not present

## 2021-11-06 DIAGNOSIS — J9602 Acute respiratory failure with hypercapnia: Secondary | ICD-10-CM | POA: Diagnosis not present

## 2021-11-16 DIAGNOSIS — E119 Type 2 diabetes mellitus without complications: Secondary | ICD-10-CM | POA: Diagnosis not present

## 2021-11-16 DIAGNOSIS — Z947 Corneal transplant status: Secondary | ICD-10-CM | POA: Diagnosis not present

## 2021-11-16 DIAGNOSIS — Z961 Presence of intraocular lens: Secondary | ICD-10-CM | POA: Diagnosis not present

## 2021-11-16 DIAGNOSIS — Z794 Long term (current) use of insulin: Secondary | ICD-10-CM | POA: Diagnosis not present

## 2021-11-16 LAB — HM DIABETES EYE EXAM

## 2021-11-24 ENCOUNTER — Encounter: Payer: Self-pay | Admitting: Internal Medicine

## 2021-11-25 DIAGNOSIS — I5032 Chronic diastolic (congestive) heart failure: Secondary | ICD-10-CM | POA: Diagnosis not present

## 2021-11-25 DIAGNOSIS — Z794 Long term (current) use of insulin: Secondary | ICD-10-CM | POA: Diagnosis not present

## 2021-11-25 DIAGNOSIS — E1122 Type 2 diabetes mellitus with diabetic chronic kidney disease: Secondary | ICD-10-CM

## 2021-11-25 DIAGNOSIS — N1831 Chronic kidney disease, stage 3a: Secondary | ICD-10-CM

## 2021-11-27 ENCOUNTER — Telehealth: Payer: Self-pay

## 2021-11-27 ENCOUNTER — Ambulatory Visit (INDEPENDENT_AMBULATORY_CARE_PROVIDER_SITE_OTHER): Payer: Medicare Other | Admitting: *Deleted

## 2021-11-27 DIAGNOSIS — Z794 Long term (current) use of insulin: Secondary | ICD-10-CM

## 2021-11-27 DIAGNOSIS — N1831 Chronic kidney disease, stage 3a: Secondary | ICD-10-CM

## 2021-11-27 DIAGNOSIS — I5032 Chronic diastolic (congestive) heart failure: Secondary | ICD-10-CM

## 2021-11-27 NOTE — Telephone Encounter (Signed)
We should definitely wait for the Novo PAP response

## 2021-11-27 NOTE — Telephone Encounter (Signed)
Ozempic too expensive per [facility] representative. Patient has requested an alternative script for coverage.     Caller [place] ph [phone] ext [phone]

## 2021-11-27 NOTE — Patient Instructions (Signed)
Visit [place], Thank you for taking time to talk with me today. Please don't hesitate to contact me if I can be of assistance to you before our next scheduled telephone appointment.  Below are the goals we discussed today:   Patient Self-Care Activities: Patient [name] will:  Take medications as prescribed Attend all scheduled provider appointments Call pharmacy for medication refills Call provider office for new concerns or questions Continue using home oxygen as prescribed and monitor your oxygen levels regularly at home: with activity and at rest, and if you ever start to feel unusually short of breath Continue to weigh daily: you reported weights at home today consistently between 164- 165 lbs; You are on track with maintaining your goal weight- keep up the great work tracking weight in your health journal Call office if I gain more than 2 pounds in one day or 5 pounds in one week: great job following your established action plan when you developed weight gain and shortness of breath (yellow zone) last week! Watch for swelling in feet, ankles and legs every day- if you experience new/ unusual swelling that does not go away promptly, call your heart failure doctor Check blood sugar at prescribed times, or if I feel it is too high or too low.  Keep up entering blood sugar readings and medication or insulin into daily log Take the blood sugar log to all doctor visits Continue your excellent efforts to follow heart healthy, low salt, low sugar/ carb modified diet-- you have great ideas around how to manage your diet for times that you do not want to eat the food that is prepared for you by your chef at [facility]; you're doing a good job controlling portion size: keep up the great work  Keep up the great work staying active; and walking for exercise when the weather is nice- keep up the great work Continue working with Dr. Arman Filter office team to obtain patient assistance for  the medication (Ozempic) that is expensive  Our next appointment is by telephone on Friday, January 01, 2022 at 9:00 am  Please call the care guide team at [phone] if you need to cancel or reschedule your appointment.   Patient denies need for educational material today and verbalizes understanding of instructions provided today and agrees to view in [place], RN, BSN, [place] [phone]: direct office [phone]: mobile

## 2021-11-27 NOTE — Chronic Care Management (AMB) (Signed)
Chronic Care Management   CCM [name] Visit Note  11/27/2021 Name: [name] MRN: [id number] DOB: 1931/06/20  Subjective: [name] is a 87 y.o. year old female who is a primary care patient of Burns, [name], MD. The care management team was consulted for assistance with disease management and care coordination needs.    Engaged with patient by telephone for follow up visit in response to provider referral for case management and/or care coordination services.   Consent to Services:  The patient was given information about Chronic Care Management services, agreed to services, and gave verbal consent prior to initiation of services.  Please see initial visit note for detailed documentation.  Patient agreed to services and verbal consent obtained.   Assessment: Review of patient past medical history, allergies, medications, health status, including review of consultants reports, laboratory and other test data, was performed as part of comprehensive evaluation and provision of chronic care management services.   SDOH (Social Determinants of Health) assessments and interventions performed:  SDOH Interventions    Flowsheet Row Most Recent Value  SDOH Interventions   Food Insecurity Interventions Intervention Not Indicated  Housing Interventions Intervention Not Indicated  [Continues to reside at ALF]  Transportation Interventions Intervention Not Indicated  [Continues to use transportation provided by ALF,  daughter assists as indicated]       CCM Care Plan Allergies  Allergen Reactions   Levaquin [Levofloxacin] Shortness Of Breath and Swelling    angioedema   Lobster [Shellfish Allergy] Other (See Comments)    angioedema   Penicillins Rash    Has patient had a PCN reaction causing immediate rash, facial/tongue/throat swelling, SOB or lightheadedness with hypotension: Yes Has patient had a PCN reaction causing severe rash involving mucus membranes or skin necrosis:  No Has patient had a PCN reaction that required hospitalization: No Has patient had a PCN reaction occurring within the last 10 years: No If all of the above answers are "NO", then may proceed with Cephalosporin use.   Valsartan Other (See Comments)    REACTION: angioedema   Codeine Other (See Comments)    Mental status changes   Lipitor [Atorvastatin] Other (See Comments)    weakness   Oxycodone Other (See Comments)    hallucinations   Statins     Made her too weak   Tramadol    Amlodipine Besylate Other (See Comments)    REACTION: tingling in lips & gum edema 7/12: talked to patient, states she is tolerating well   Crestor [Rosuvastatin Calcium] Rash   Tramadol Hcl Nausea And Vomiting   Outpatient Encounter Medications as of 11/27/2021  Medication Sig Note   acetaminophen (TYLENOL) 325 MG tablet Take 2 tablets (650 mg total) by mouth every 6 (six) hours as needed for mild pain or headache.    amLODipine (NORVASC) 2.5 MG tablet Take 1 tablet (2.5 mg total) by mouth daily. May take extra 2.5 mg for systolic BP > 144    carvedilol (COREG) 6.25 MG tablet TAKE 1 TABLET BY MOUTH  TWICE DAILY WITH MEALS    ELIQUIS 5 MG TABS tablet TAKE 1 TABLET BY MOUTH  TWICE DAILY    glucose blood (ONETOUCH VERIO) test strip 1 each by Other route 2 (two) times daily. And lancets 2/day    Insulin Lispro Prot & Lispro (HUMALOG MIX 75/25 KWIKPEN) (75-25) 100 UNIT/ML Kwikpen 32 units with breakfast and 6 units with dinner.    Lancet Devices (LANCING DEVICE) MISC Use as advised - for  Delica Lancets    Lancets (ONETOUCH DELICA PLUS [id number]) MISC USE TO MONITOR GLUCOSE  LEVELS TWICE DAILY    Multiple Vitamins-Minerals (CENTRUM SILVER ULTRA WOMENS PO) Take 1 tablet by mouth daily.    nitroGLYCERIN (NITROSTAT) 0.4 MG SL tablet Place 1 tablet (0.4 mg total) under the tongue every 5 (five) minutes as needed for chest pain. Reported on 01/28/2016 03/10/2021: 03/10/21: Reports has not needed recently   NOVOFINE PLUS  PEN NEEDLE 32G X 4 MM MISC SMARTSIG:1 Each SUB-Q Daily    OXYGEN Inhale 3 % into the lungs continuous.    potassium chloride SA (KLOR-CON) 20 MEQ tablet TAKE 2 TABLETS BY MOUTH  DAILY    Semaglutide,0.25 or 0.5MG /DOS, (OZEMPIC, 0.25 OR 0.5 MG/DOSE,) 2 MG/1.5ML SOPN Inject 0.5 mg into the skin once a week.    torsemide (DEMADEX) 20 MG tablet TAKE 2 TABLETS BY MOUTH  TWICE DAILY    No facility-administered encounter medications on file as of 11/27/2021.   Patient Active Problem List   Diagnosis Date Noted   Chronic respiratory failure with hypoxia ([place]) 05/12/2021   Acute respiratory failure with hypoxia and hypercapnia (HCC) 01/31/2021   Elevated troponin 01/31/2021   Poorly controlled type 2 diabetes mellitus with circulatory disorder ([place]) 01/31/2021   Neuropathy 08/19/2020   PAD (peripheral artery disease) ([place]) 02/29/2020   Chronic kidney disease (CKD) 02/20/2020   Osteoarthritis 08/01/2019   Arthritis of left sacroiliac joint 05/04/2017   Osteopenia 02/17/2017   Piriformis syndrome of left side 02/16/2017   Acquired leg length discrepancy 02/16/2017   COPD GOLD I  01/27/2017   Lumbar radiculopathy, acute 01/27/2017   Granuloma annulare 07/27/2016   Numbness of fingers 07/27/2016   Primary osteoarthritis of right knee 06/27/2016   Rash and nonspecific skin eruption 06/09/2016   Diabetes ([place]) 03/07/2016   Acute on chronic combined systolic (congestive) and diastolic (congestive) heart failure (HCC)    OSA  08/28/2014   Chronic diastolic heart failure ([place]) 07/30/2014   Nocturnal hypoxemia 07/09/2014   SOB (shortness of breath) 05/15/2014   Pulmonary HTN ([place]) 05/15/2014   Band keratopathy 01/16/2014   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   Hyperthyroidism 11/04/2011   Multinodular goiter 10/14/2011   Atrophy, Fuchs' 09/29/2011   ATRIAL FIBRILLATION 06/03/2010   VITILIGO 11/07/2009   SINOATRIAL NODE DYSFUNCTION 06/16/2009   Atrial flutter ([place]) 06/25/2008    HYPERLIPIDEMIA 01/29/2008   Coronary atherosclerosis 01/29/2008   HTN (hypertension) 10/30/2007   Conditions to be addressed/monitored:  CHF and DMII  Care Plan : [name] Care Manager Plan of Care  Updates made by [name] Royalty, [name] since 11/27/2021 12:00 AM     Problem: Chronic Disease Management Needs   Priority: High     Long-Range Goal: Development of plan of care for long term chronic disease management   Start Date: 10/27/2021  Expected End Date: 10/27/2022  Recent Progress: On track  Priority: High  Note:   Current Barriers:  Chronic Disease Management support and education needs related to CHF and DMII- patient appreciaitive of ongoing support and reinforcement Chef prepared meals at ALF- difficult to follow prescribed diet  RNCM Clinical Goal(s):  Patient will demonstrate Ongoing health management independence DMII/ CHF  through collaboration with [name] Care manager, provider, and care team.   Interventions: 1:1 collaboration with primary care provider regarding development and update of comprehensive plan of care as evidenced by provider attestation and co-signature Inter-disciplinary care team collaboration (see longitudinal plan of care) Evaluation of current treatment plan related  to  self management and patient's adherence to plan as established by provider  Diabetes Interventions: Assessed patient's understanding of A1c goal: <7% Provided education to patient about basic DM disease process Discussed plans with patient for ongoing care management follow up and provided patient with direct contact information for care management team Reviewed scheduled/upcoming provider appointments including: 12/17/21- CHF clinic; 12/25/21- endocrinology provider office visit Review of patient status, including review of consultants reports, relevant laboratory and other test results, and medications completed Discussed patient's current difficulty affording Ozempic: reports she continues  working with endocrinology team at Dr. Arman Filter office on patient assistance; has not yet heard back around status of PAP for Ozempic Reviewed recent blood sugars at home: reports consistent recent fasting blood sugars between 121-162; post-prandial/ HS blood sugars between 160-190; reports fasting blood sugar this morning of "133;" reports 2 episodes hypoglycemia that awoke her from her sleep since our last outreach: states these occurred because she chose not to eat the high salt food prepared by the ALF staff, that is not on her diet; patient had been trying to keep some frozen meals in her room for self-preparing in this situation, unfortunately, she has not been doing this as much lately-- she verbalizes plans to resume "soon;" states her episodes of hypoglycemia were symptomatic and corrected by use of glucose tablets Confirmed she continues to remain active-- going to yoga this morning, walking on nice days Confirmed patient continues to try and follow carb-modified/ low sugar diet around chef prepared meals at ALF: discussed ongoing strategies she has tried in the past and plans to continue trying- positive reinforcement provided  Lab Results  Component Value Date   HGBA1C 9.0 (A) 09/14/2021  Heart Failure Interventions: Discussed importance of daily weight and advised patient to weigh and record daily Discussed the importance of keeping all appointments with provider Provided patient with education about the role of exercise in the management of heart failure Screening for signs and symptoms of depression related to chronic disease state  Assessed social determinant of health barriers  Reviewed upcoming provider office visits with patient and confirmed she is aware of/ has plans to attend as scheduled: 12/17/21: CHF/ [facility]; 12/25/21- endocrinology Patient reports episode since last outreach when she went outside to plant flowers at ALF, and "over-did;" states she "felty miserable and  almost "threw herself" into CHF exacerbation: became short of breath, started gaining weight, had increased LE swelling, felt nauseous and fatigued: reports she followed established action plan and took "extra" diuretic; reports symptoms eventually subsided within a week, and that she "finally" back at baseline: positive reinforcement provided for following established action plan; encouraged patient to not over-do on activity at once, to pace self- she verbalizes understanding and agreement, states "Learned my lesson" Reviewed weights this week after last week's weight gain from overdoing: she reports consistent weights between 164-166, with a weight this morning of "165 lbs" Confirms patient continues using home O2 as indicated; able to go periods of time without it Discussed plans of patient to obtain 5th COVID booster vaccine this afternoon at [place] coming to mass vaccinate residents Reinforced previously provided education around benefit of activity in setting of CHF/ Chronic Respiratory Failure with hypoxemia: she continues remaining active, does yoga, walks, etc  Patient Goals/Self-Care Activities: Patient Rosealie will:  Take medications as prescribed Attend all scheduled provider appointments Call pharmacy for medication refills Call provider office for new concerns or questions Continue using home oxygen as prescribed and monitor your oxygen  levels regularly at home: with activity and at rest, and if you ever start to feel unusually short of breath Continue to weigh daily: you reported weights at home today consistently between 164- 165 lbs; You are on track with maintaining your goal weight- keep up the great work tracking weight in your health journal Call office if I gain more than 2 pounds in one day or 5 pounds in one week: great job following your established action plan when you developed weight gain and shortness of breath (yellow zone) last week! Watch for swelling  in feet, ankles and legs every day- if you experience new/ unusual swelling that does not go away promptly, call your heart failure doctor Check blood sugar at prescribed times, or if I feel it is too high or too low.  Keep up entering blood sugar readings and medication or insulin into daily log Take the blood sugar log to all doctor visits Continue your excellent efforts to follow heart healthy, low salt, low sugar/ carb modified diet-- you have great ideas around how to manage your diet for times that you do not want to eat the food that is prepared for you by your chef at [facility]; you're doing a good job controlling portion size: keep up the great work  Keep up the great work staying active; and walking for exercise when the weather is nice- keep up the great work Continue working with Dr. Arman Filter office team to obtain patient assistance for the medication (Ozempic) that is expensive Follow Up Plan:   Telephone follow up appointment with care management team member scheduled for:  Friday, January 01, 2022 at 9:00 am The patient has been provided with contact information for the care management team and has been advised to call with any health related questions or concerns    Plan: The patient has been provided with contact information for the care management team and has been advised to call with any health related questions or concerns  [name], [name], BSN, [place] [phone]: direct office [phone]: mobile

## 2021-12-03 ENCOUNTER — Telehealth: Payer: Self-pay

## 2021-12-03 NOTE — Chronic Care Management (AMB) (Signed)
Chronic Care Management Pharmacy Assistant   Name: [name]  MRN: [id number] DOB: 08-13-1931   Reason for Encounter: Disease State   Conditions to be addressed/monitored: HTN   Recent office visits:  10/21/21 Philemon Kingdom, MD-Internal Medicine (Type 2 diabetes mellitus with stage 3a chronic kidney disease, with long-term current use of insulin) med changes:increas novolin 70/30 16 units 30 min before breakfast and 12 units 30 mins before dinner  Recent consult visits:  None ID  Hospital visits:  None in previous 6 months  Medications: Outpatient Encounter Medications as of 12/03/2021  Medication Sig Note   acetaminophen (TYLENOL) 325 MG tablet Take 2 tablets (650 mg total) by mouth every 6 (six) hours as needed for mild pain or headache.    amLODipine (NORVASC) 2.5 MG tablet Take 1 tablet (2.5 mg total) by mouth daily. May take extra 2.5 mg for systolic BP > 938    carvedilol (COREG) 6.25 MG tablet TAKE 1 TABLET BY MOUTH  TWICE DAILY WITH MEALS    ELIQUIS 5 MG TABS tablet TAKE 1 TABLET BY MOUTH  TWICE DAILY    glucose blood (ONETOUCH VERIO) test strip 1 each by Other route 2 (two) times daily. And lancets 2/day    Insulin Lispro Prot & Lispro (HUMALOG MIX 75/25 KWIKPEN) (75-25) 100 UNIT/ML Kwikpen 32 units with breakfast and 6 units with dinner.    Lancet Devices (LANCING DEVICE) MISC Use as advised - for Delica Lancets    Lancets (ONETOUCH DELICA PLUS [id number]) MISC USE TO MONITOR GLUCOSE  LEVELS TWICE DAILY    Multiple Vitamins-Minerals (CENTRUM SILVER ULTRA WOMENS PO) Take 1 tablet by mouth daily.    nitroGLYCERIN (NITROSTAT) 0.4 MG SL tablet Place 1 tablet (0.4 mg total) under the tongue every 5 (five) minutes as needed for chest pain. Reported on 01/28/2016 03/10/2021: 03/10/21: Reports has not needed recently   NOVOFINE PLUS PEN NEEDLE 32G X 4 MM MISC SMARTSIG:1 Each SUB-Q Daily    OXYGEN Inhale 3 % into the lungs continuous.    potassium chloride SA  (KLOR-CON) 20 MEQ tablet TAKE 2 TABLETS BY MOUTH  DAILY    Semaglutide,0.25 or 0.5MG /DOS, (OZEMPIC, 0.25 OR 0.5 MG/DOSE,) 2 MG/1.5ML SOPN Inject 0.5 mg into the skin once a week.    torsemide (DEMADEX) 20 MG tablet TAKE 2 TABLETS BY MOUTH  TWICE DAILY    No facility-administered encounter medications on file as of 12/03/2021.   Reviewed chart prior to disease state call. Spoke with patient regarding BP  Recent Office Vitals: BP Readings from Last 3 Encounters:  10/21/21 140/60  10/07/21 (!) 142/62  09/14/21 140/60   Pulse Readings from Last 3 Encounters:  10/21/21 82  10/07/21 62  09/14/21 74    Wt Readings from Last 3 Encounters:  10/21/21 168 lb 3.2 oz (76.3 kg)  10/07/21 168 lb 9.6 oz (76.5 kg)  09/14/21 172 lb 3.2 oz (78.1 kg)     Kidney Function Lab Results  Component Value Date/Time   CREATININE 0.84 02/05/2021 02:15 AM   CREATININE 0.79 02/04/2021 02:55 AM   GFR 53.03 (L) 02/20/2020 12:39 PM   GFRNONAA >60 02/05/2021 02:15 AM   GFRAA 46 (L) 06/09/2020 11:33 AM    BMP Latest Ref Rng & Units 02/05/2021 02/04/2021 02/04/2021  Glucose 70 - 99 mg/dL 183(H) - -  BUN 8 - 23 mg/dL 16 - -  Creatinine 0.44 - 1.00 mg/dL 0.84 - -  Sodium 135 - 145 mmol/L 139 141 144  Potassium  3.5 - 5.1 mmol/L 3.7 3.6 3.3(L)  Chloride 98 - 111 mmol/L 92(L) - -  CO2 22 - 32 mmol/L 37(H) - -  Calcium 8.9 - 10.3 mg/dL 9.7 - -    Reviewed chart prior to disease state call, unable to speak with patient regarding BP  Current antihypertensive regimen:  Amlodipine 2.5 mg daily Carvedilol 6.25 mg 1 tab twice daily   Adherence Review: Is the patient currently on ACE/ARB medication? No Does the patient have >5 day gap between last estimated fill dates? No   Care Gaps: Colonoscopy-NA Diabetic Foot Exam-03/24/21 Mammogram-NA Ophthalmology-06/05/18 Dexa Scan - 02/16/17 Annual Well Visit - NA Micro albumin-07/31/18 Hemoglobin A1c- 09/14/21  Star Rating Drugs: Ozempic last fill 09/14/21   Ethelene Hal Clinical Pharmacist Assistant [phone]

## 2021-12-05 DIAGNOSIS — J9601 Acute respiratory failure with hypoxia: Secondary | ICD-10-CM | POA: Diagnosis not present

## 2021-12-05 DIAGNOSIS — I5033 Acute on chronic diastolic (congestive) heart failure: Secondary | ICD-10-CM | POA: Diagnosis not present

## 2021-12-05 DIAGNOSIS — G4733 Obstructive sleep apnea (adult) (pediatric): Secondary | ICD-10-CM | POA: Diagnosis not present

## 2021-12-05 DIAGNOSIS — J9602 Acute respiratory failure with hypercapnia: Secondary | ICD-10-CM | POA: Diagnosis not present

## 2021-12-06 DIAGNOSIS — J9602 Acute respiratory failure with hypercapnia: Secondary | ICD-10-CM | POA: Diagnosis not present

## 2021-12-06 DIAGNOSIS — G4733 Obstructive sleep apnea (adult) (pediatric): Secondary | ICD-10-CM | POA: Diagnosis not present

## 2021-12-06 DIAGNOSIS — I5033 Acute on chronic diastolic (congestive) heart failure: Secondary | ICD-10-CM | POA: Diagnosis not present

## 2021-12-06 DIAGNOSIS — J9601 Acute respiratory failure with hypoxia: Secondary | ICD-10-CM | POA: Diagnosis not present

## 2021-12-16 ENCOUNTER — Other Ambulatory Visit (HOSPITAL_COMMUNITY): Payer: Self-pay | Admitting: Internal Medicine

## 2021-12-17 ENCOUNTER — Encounter (HOSPITAL_COMMUNITY): Payer: Self-pay | Admitting: Internal Medicine

## 2021-12-17 ENCOUNTER — Ambulatory Visit (HOSPITAL_COMMUNITY)
Admission: RE | Admit: 2021-12-17 | Discharge: 2021-12-17 | Disposition: A | Discharge status: Home / Self Care | Payer: Medicare Other | Source: Ambulatory Visit | Attending: Internal Medicine | Admitting: Internal Medicine

## 2021-12-17 ENCOUNTER — Other Ambulatory Visit: Payer: Self-pay

## 2021-12-17 VITALS — BP 140/68 | HR 76 | Wt 166.0 lb

## 2021-12-17 DIAGNOSIS — Z87891 Personal history of nicotine dependence: Secondary | ICD-10-CM | POA: Diagnosis not present

## 2021-12-17 DIAGNOSIS — Z09 Encounter for follow-up examination after completed treatment for conditions other than malignant neoplasm: Secondary | ICD-10-CM | POA: Insufficient documentation

## 2021-12-17 DIAGNOSIS — I5032 Chronic diastolic (congestive) heart failure: Secondary | ICD-10-CM | POA: Diagnosis not present

## 2021-12-17 DIAGNOSIS — E1140 Type 2 diabetes mellitus with diabetic neuropathy, unspecified: Secondary | ICD-10-CM | POA: Diagnosis not present

## 2021-12-17 DIAGNOSIS — Z79899 Other long term (current) drug therapy: Secondary | ICD-10-CM | POA: Diagnosis not present

## 2021-12-17 DIAGNOSIS — I2510 Atherosclerotic heart disease of native coronary artery without angina pectoris: Secondary | ICD-10-CM | POA: Diagnosis not present

## 2021-12-17 DIAGNOSIS — I480 Paroxysmal atrial fibrillation: Secondary | ICD-10-CM | POA: Diagnosis not present

## 2021-12-17 DIAGNOSIS — Z7902 Long term (current) use of antithrombotics/antiplatelets: Secondary | ICD-10-CM | POA: Diagnosis not present

## 2021-12-17 DIAGNOSIS — Z7984 Long term (current) use of oral hypoglycemic drugs: Secondary | ICD-10-CM | POA: Insufficient documentation

## 2021-12-17 DIAGNOSIS — Z66 Do not resuscitate: Secondary | ICD-10-CM | POA: Diagnosis not present

## 2021-12-17 DIAGNOSIS — I272 Pulmonary hypertension, unspecified: Secondary | ICD-10-CM | POA: Insufficient documentation

## 2021-12-17 DIAGNOSIS — G4733 Obstructive sleep apnea (adult) (pediatric): Secondary | ICD-10-CM | POA: Insufficient documentation

## 2021-12-17 DIAGNOSIS — J449 Chronic obstructive pulmonary disease, unspecified: Secondary | ICD-10-CM | POA: Diagnosis not present

## 2021-12-17 DIAGNOSIS — I110 Hypertensive heart disease with heart failure: Secondary | ICD-10-CM | POA: Insufficient documentation

## 2021-12-17 DIAGNOSIS — I10 Essential (primary) hypertension: Secondary | ICD-10-CM | POA: Diagnosis not present

## 2021-12-17 DIAGNOSIS — I5042 Chronic combined systolic (congestive) and diastolic (congestive) heart failure: Secondary | ICD-10-CM | POA: Diagnosis not present

## 2021-12-17 DIAGNOSIS — I2721 Secondary pulmonary arterial hypertension: Secondary | ICD-10-CM | POA: Diagnosis not present

## 2021-12-17 DIAGNOSIS — I252 Old myocardial infarction: Secondary | ICD-10-CM | POA: Diagnosis not present

## 2021-12-17 DIAGNOSIS — Z7901 Long term (current) use of anticoagulants: Secondary | ICD-10-CM | POA: Insufficient documentation

## 2021-12-17 DIAGNOSIS — I4892 Unspecified atrial flutter: Secondary | ICD-10-CM | POA: Diagnosis not present

## 2021-12-17 LAB — CBC
HCT: 51.5 % — ABNORMAL HIGH (ref 36.0–46.0)
Hemoglobin: 16.2 g/dL — ABNORMAL HIGH (ref 12.0–15.0)
MCH: 30.1 pg (ref 26.0–34.0)
MCHC: 31.5 g/dL (ref 30.0–36.0)
MCV: 95.7 fL (ref 80.0–100.0)
Platelets: 179 10*3/uL (ref 150–400)
RBC: 5.38 MIL/uL — ABNORMAL HIGH (ref 3.87–5.11)
RDW: 14 % (ref 11.5–15.5)
WBC: 5.2 10*3/uL (ref 4.0–10.5)
nRBC: 0 % (ref 0.0–0.2)

## 2021-12-17 LAB — BASIC METABOLIC PANEL
Anion gap: 10 (ref 5–15)
BUN: 31 mg/dL — ABNORMAL HIGH (ref 8–23)
CO2: 31 mmol/L (ref 22–32)
Calcium: 10.3 mg/dL (ref 8.9–10.3)
Chloride: 99 mmol/L (ref 98–111)
Creatinine, Ser: 1.24 mg/dL — ABNORMAL HIGH (ref 0.44–1.00)
GFR, Estimated: 42 mL/min — ABNORMAL LOW (ref >60)
Glucose, Bld: 150 mg/dL — ABNORMAL HIGH (ref 70–99)
Potassium: 4.4 mmol/L (ref 3.5–5.1)
Sodium: 140 mmol/L (ref 135–145)

## 2021-12-17 LAB — BRAIN NATRIURETIC PEPTIDE: B Natriuretic Peptide: 277.8 pg/mL — ABNORMAL HIGH (ref 0.0–100.0)

## 2021-12-17 NOTE — Progress Notes (Signed)
Advanced [facility] Failure Clinic Note   Date:  12/17/2021   ID:  [name], DOB Dec 03, 1931, MRN [id number]  Location: Home  Provider location: [facility] Type of Visit: Established patient  PCP:  [name], MD  Cardiologist:  Pixie Casino, MD Primary HF: Mirka Barbone  Chief Complaint: Heart Failure follow-up   History of Present Illness:  [name] is a 87 y.o. female with CAD s/p 1v stent, HTN, atrial fibrillation/A flutter and chronic prior systolic heart failure, (which was likely rate related) and diastolic heart failure. She was referred by Dr. Lovena Le for further evaluation of Pulmonary HTN.   Echo 2/22 EF 40-45%   Underwent R/L cath 05/07/14 Which showed stable CAD and significant PH with normal PVR.   In 4/21 saw Dr. Scot Dock and had LE angio with severe PAD underwent stenting of 70% R external iliac   Here with her daughter for f/u. At last visit she enrolled with home Palliative Care. She is feeling really good. Gets around well. Walks 1 mile 3x/week. Denies SOB, orthopnea or PND. Takes PT routinely. No claudication. + neuropathy.    Cardiac studies:  Myoview 9/20  Nuclear stress EF: 56%. There was no ST segment deviation noted during stress. Defect 1: There is a medium defect of moderate severity present in the mid inferolateral, apical inferior and apical lateral location. Findings consistent with possible mild ischemia in the apical inferior and inferolateral regions. The left ventricular ejection fraction is normal (55-65%).    Admitted in 5/21 with NSTEMI hstrop peak 1,455. Echo 5/21: EF 50-55%  Cath 05/16/20 with  1. Severe 3 vessel obstructive CAD 2. Severe pulmonary HTN 3. Successful PCI of the proximal LCx/OM1 with DES x 1 with IVUS guidance 4. Successful PCI of the proximal LAD with orbital atherectomy and DES x 1  RA = 6 RV = 75/6 PA = 77/26 (45) PCW = 10 Fick cardiac output/index = 4.0/2.2 PVR  = 8.8 WU FA sat = 99% PA sat = 67%, 73%  Admitted 2/22 with volume overload. Diuresed. Repeat RHC on 02/04/21 as below. Diuresed and started on sildenafil. D/c weight 167  RA = 6 RV = 88/5 PA =  86/33 (53) PCW = 21 Fick cardiac output/index = 3.4/1.9 PVR = 9.4 WU Ao sat = 97% PA sat = 64%, 64% PAPi = 8.8    Past Medical History:  Diagnosis Date   Anemia    iron defficiency   Anginal pain (HCC)    Arthritis    "knees, feet, hands; joints" (05/27/2015)   Asthma    Atrial fibrillation or flutter    s/p RFCA 7/08;   s/p DCCV in past;   previously on amiodarone;  amio stopped due to lung toxicity   CAD (coronary artery disease)    s/p NSTEMI tx with BMS to OM1 3/08;  cath 3/08: pOM 99% tx with PCI, pLAD 20%, ? mod stenosis at the AM   CAP (community acquired pneumonia) 05/24/2015   CHF (congestive heart failure) ([place])    Chronic diastolic heart failure ([place])    echo 11/11:  EF 55-60%, severe LVH, mod LAE, mild MR, mildly increased PASP   COPD (chronic obstructive pulmonary disease) (HCC)    Degenerative joint disease    Dystrophy, corneal stromal    Heart murmur    HLD (hyperlipidemia)    HTN (hypertension)    essential nos   Hypopotassemia    PMH of  Muscle pain    Myocardial infarction ([place]) 2008   OSA on CPAP    Osteoporosis    Pneumonia 05/27/2015   Protein calorie malnutrition (HCC)    Rash and nonspecific skin eruption    both arms,awaiting bio   dr Delman Cheadle   Seasonal allergies    Shortness of breath dyspnea    Type II diabetes mellitus ([place])    Dr Loanne Drilling   Past Surgical History:  Procedure Laterality Date   A FLUTTER ABLATION     Dr Lovena Le   ABDOMINAL AORTOGRAM W/LOWER EXTREMITY Bilateral 04/11/2020   Procedure: ABDOMINAL AORTOGRAM W/LOWER EXTREMITY;  Surgeon: Angelia Mould, MD;  Location: [facility];  Service: Cardiovascular;  Laterality: Bilateral;   CATARACT EXTRACTION W/ INTRAOCULAR LENS  IMPLANT, BILATERAL Bilateral    CHOLECYSTECTOMY      COLONOSCOPY  6/07   2 polyps Dr. Kinnie Feil in [place]   colonoscopy with polypectomy      X 2; [facility]   CORNEAL TRANSPLANT Bilateral    CORONARY ANGIOPLASTY WITH STENT PLACEMENT  02/2007   BMS w/Dr Lovena Le   CORONARY ATHERECTOMY N/A 05/16/2020   Procedure: CORONARY ATHERECTOMY;  Surgeon: Martinique, Peter M, MD;  Location: [facility];  Service: Cardiovascular;  Laterality: N/A;   CORONARY BALLOON ANGIOPLASTY N/A 05/16/2020   Procedure: CORONARY BALLOON ANGIOPLASTY;  Surgeon: Martinique, Peter M, MD;  Location: [facility];  Service: Cardiovascular;  Laterality: N/A;   CORONARY STENT INTERVENTION N/A 05/16/2020   Procedure: CORONARY STENT INTERVENTION;  Surgeon: Martinique, Peter M, MD;  Location: [facility];  Service: Cardiovascular;  Laterality: N/A;   EYE SURGERY     gravid 2 para 2     INTRAVASCULAR ULTRASOUND/IVUS N/A 05/16/2020   Procedure: Intravascular Ultrasound/IVUS;  Surgeon: Martinique, Peter M, MD;  Location: [facility];  Service: Cardiovascular;  Laterality: N/A;   KNEE CARTILAGE SURGERY Right    LEFT AND RIGHT HEART CATHETERIZATION WITH CORONARY ANGIOGRAM N/A 05/07/2014   Procedure: LEFT AND RIGHT HEART CATHETERIZATION WITH CORONARY ANGIOGRAM;  Surgeon: Peter M Martinique, MD;  Location: [facility];  Service: Cardiovascular;  Laterality: N/A;   PERIPHERAL VASCULAR INTERVENTION  04/11/2020   Procedure: PERIPHERAL VASCULAR INTERVENTION;  Surgeon: Angelia Mould, MD;  Location: [facility];  Service: Cardiovascular;;   RIGHT HEART CATH N/A 02/04/2021   Procedure: RIGHT HEART CATH;  Surgeon: Jolaine Artist, MD;  Location: [facility];  Service: Cardiovascular;  Laterality: N/A;   RIGHT/LEFT HEART CATH AND CORONARY ANGIOGRAPHY N/A 05/16/2020   Procedure: RIGHT/LEFT HEART CATH AND CORONARY ANGIOGRAPHY;  Surgeon: Jolaine Artist, MD;  Location: [place] [facility];  Service: Cardiovascular;  Laterality: N/A;   TOTAL KNEE ARTHROPLASTY Right 06/28/2016    Procedure: TOTAL KNEE ARTHROPLASTY;  Surgeon: Frederik Pear, MD;  Location: [facility];  Service: Orthopedics;  Laterality: Right;     Current Outpatient Medications  Medication Sig Dispense Refill   acetaminophen (TYLENOL) 325 MG tablet Take 2 tablets (650 mg total) by mouth every 6 (six) hours as needed for mild pain or headache. 20 tablet 0   amLODipine (NORVASC) 2.5 MG tablet TAKE 1 TABLET BY MOUTH  DAILY AND MAY TAKE EXTRA  2.5MG  FOR SYSTOLIC BLOOD  PRESSURE OVER 180 180 tablet 3   carvedilol (COREG) 6.25 MG tablet TAKE 1 TABLET BY MOUTH  TWICE DAILY WITH MEALS 180 tablet 3   ELIQUIS 5 MG TABS tablet TAKE 1 TABLET BY MOUTH  TWICE  DAILY 60 tablet 11   glucose blood (ONETOUCH VERIO) test strip 1 each by Other route 2 (two) times daily. And lancets 2/day 200 strip 3   insulin aspart protamine- aspart (NOVOLOG MIX 70/30) (70-30) 100 UNIT/ML injection Inject 16 Units into the skin daily with breakfast. 12 units in the PM     Lancet Devices (LANCING DEVICE) MISC Use as advised - for Delica Lancets 1 each 0   Lancets (ONETOUCH DELICA PLUS ZCHYIF02D) MISC USE TO MONITOR GLUCOSE  LEVELS TWICE DAILY 200 each 3   Multiple Vitamins-Minerals (CENTRUM SILVER ULTRA WOMENS PO) Take 1 tablet by mouth daily.     nitroGLYCERIN (NITROSTAT) 0.4 MG SL tablet Place 1 tablet (0.4 mg total) under the tongue every 5 (five) minutes as needed for chest pain. Reported on 01/28/2016 30 tablet 3   NOVOFINE PLUS PEN NEEDLE 32G X 4 MM MISC SMARTSIG:1 Each SUB-Q Daily 200 each 1   OXYGEN Inhale 3 Doses into the lungs as needed. 3 liters     potassium chloride SA (KLOR-CON) 20 MEQ tablet TAKE 2 TABLETS BY MOUTH  DAILY 180 tablet 3   Semaglutide,0.25 or 0.5MG /DOS, (OZEMPIC, 0.25 OR 0.5 MG/DOSE,) 2 MG/1.5ML SOPN Inject 0.5 mg into the skin once a week. 4.5 mL 3   torsemide (DEMADEX) 20 MG tablet TAKE 2 TABLETS BY MOUTH  TWICE DAILY 120 tablet 11   No current facility-administered medications for this encounter.    Allergies:    Levaquin [levofloxacin], Lobster [shellfish allergy], Penicillins, Valsartan, Codeine, Lipitor [atorvastatin], Oxycodone, Statins, Tramadol, Amlodipine besylate, Crestor [rosuvastatin calcium], and Tramadol hcl   Social History:  The patient  reports that she quit smoking about 53 years ago. Her smoking use included cigarettes. She has a 4.50 pack-year smoking history. She has never used smokeless tobacco. She reports current alcohol use. She reports that she does not use drugs.   Family History:  The patient's family history includes Arthritis in her father, maternal aunt, and mother; Breast cancer in her maternal aunt; Diabetes in her mother; Heart attack (age of onset: 32) in her father; Hypertension in her father and mother; Transient ischemic attack in her mother.   ROS:  Please see the history of present illness.   All other systems are personally reviewed and negative.   Vitals:   12/17/21 1131  BP: 140/68  Pulse: 76  SpO2: 92%  Weight: 75.3 kg (166 lb)   Wt Readings from Last 3 Encounters:  12/17/21 75.3 kg (166 lb)  10/21/21 76.3 kg (168 lb 3.2 oz)  10/07/21 76.5 kg (168 lb 9.6 oz)     Exam:   General:  Looks younger than stated age. No resp difficulty HEENT: normal Neck: supple. no JVD. Carotids 2+ bilat; no bruits. No lymphadenopathy or thryomegaly appreciated. Cor: PMI nondisplaced. Irregular rate & rhythm. No rubs, gallops or murmurs. Lungs: clear Abdomen: soft, nontender, nondistended. No hepatosplenomegaly. No bruits or masses. Good bowel sounds. Extremities: no cyanosis, clubbing, rash, edema Neuro: alert & orientedx3, cranial nerves grossly intact. moves all 4 extremities w/o difficulty. Affect pleasant    Recent Labs: 01/30/2021: B Natriuretic Peptide 171.8 02/05/2021: ALT 18; BUN 16; Creatinine, Ser 0.84; Hemoglobin 14.7; Magnesium 1.8; Platelets 156; Potassium 3.7; Sodium 139  Personally reviewed   Wt Readings from Last 3 Encounters:  12/17/21 75.3 kg (166 lb)   10/21/21 76.3 kg (168 lb 3.2 oz)  10/07/21 76.5 kg (168 lb 9.6 oz)     ASSESSMENT AND PLAN:   1) Chronic diastolic CHF:  -  Echo 02/2015 EF 60-65%, LA severely dilated, Echo 10/2017: EF 65-70%, grade 3 DD - Echo 5/21 EF 50-55% - Echo 2/22 EF 40-45% - Doing great NYHA II - Volume status looks good - Continue torsemide 40 bid - Not on Entresto due to h/o angioedema with ARB - Not interested in further med titration.  - Labs today  2) CAD - H/o of stent in 2008 - NSTEMI 5/21. Cath as above. S/p PCI to LAD & OM - No s/s angina - On Plavix, apixaban. Will stop Plavix as she is 1 year out from PCI and high risk for bleeding.  - Unable to tolerate statins. Was on Repatha/zetia per [facility] but she stopped because she wanted to limit her meds.  3) PAD - severe  - s/p stenting to R external iliac with Dr. Scot Dock - Denies claudication  4) PAH - Moderate to severe by cath 5/21 and 2/22.  - suspect multifactorial  - wearing CPAP - she stopped sildenafil due to side effects  5) Paroxysmal atrial fib/flutter - remains in AFL today. Rate controlled. Asx This patients CHA2DS2-VASc Score and unadjusted Ischemic Stroke Rate (% per year) is equal to 7.2 % stroke rate/year from a score of 5 Above score calculated as 2 points each if present [Age > 75, or Stroke/TIA/TE] - Continue Eliquis. No bleeding   6) OSA/COPD - Follows with Dr. Halford Chessman.  - Compliant with CPAP   7) HTN - Blood pressure moderately elevated.  - Continue amlodipine 2.5  - Goal BP 130-160  - Can take extra amlodipine 2.5 for SBP > 170  8) DM2 - Stopped Jardiance due to cost  - No change  9) HL  - Unable to tolerate statins. - As above  10) DNR/DNI - continue Palliative Care support    Signed, Glori Bickers, MD  12/17/2021 11:57 AM  Advanced Heart Failure [address] [address] [place] [address] [phone] (office) [phone] (fax)

## 2021-12-17 NOTE — Patient Instructions (Signed)
Medication Changes:  No change  Lab Work:  Labs done today, your results will be available in MyChart, we will contact you for abnormal readings.   Testing/Procedures:  none  Referrals:  none  Special Instructions // Education:  none  Follow-Up in: 6 months (June 2023) ** Call in May 2023 for appointment**  At the [facility], you and your health needs are our priority. We have a designated team specialized in the treatment of Heart Failure. This Care Team includes your primary Heart Failure Specialized Cardiologist (physician), Advanced Practice Providers (APPs- Physician Assistants and Nurse Practitioners), and Pharmacist who all work together to provide you with the care you need, when you need it.   You may see any of the following providers on your designated Care Team at your next follow up:  Dr Glori Bickers Dr Haynes Kerns, NP Lyda Jester, [place] [facility] [place], [place] Audry Riles, PharmD   Please be sure to bring in all your medications bottles to every appointment.   Need to Contact us:  If you have any questions or concerns before your next appointment please send [place] a message through [place] or call our office at [phone].    TO LEAVE A MESSAGE FOR THE NURSE SELECT OPTION 2, PLEASE LEAVE A MESSAGE INCLUDING: YOUR NAME DATE OF BIRTH CALL BACK NUMBER REASON FOR CALL**this is important as we prioritize the call backs  YOU WILL RECEIVE A CALL BACK THE SAME DAY AS LONG AS YOU CALL BEFORE 4:00 PM

## 2021-12-25 ENCOUNTER — Ambulatory Visit (INDEPENDENT_AMBULATORY_CARE_PROVIDER_SITE_OTHER): Payer: Medicare Other | Admitting: Internal Medicine

## 2021-12-25 ENCOUNTER — Other Ambulatory Visit: Payer: Self-pay

## 2021-12-25 ENCOUNTER — Encounter: Payer: Self-pay | Admitting: Internal Medicine

## 2021-12-25 VITALS — BP 130/68 | HR 79 | Ht 63.0 in | Wt 165.2 lb

## 2021-12-25 DIAGNOSIS — E1122 Type 2 diabetes mellitus with diabetic chronic kidney disease: Secondary | ICD-10-CM | POA: Diagnosis not present

## 2021-12-25 DIAGNOSIS — Z794 Long term (current) use of insulin: Secondary | ICD-10-CM

## 2021-12-25 DIAGNOSIS — E782 Mixed hyperlipidemia: Secondary | ICD-10-CM

## 2021-12-25 DIAGNOSIS — E669 Obesity, unspecified: Secondary | ICD-10-CM | POA: Diagnosis not present

## 2021-12-25 DIAGNOSIS — N1831 Chronic kidney disease, stage 3a: Secondary | ICD-10-CM

## 2021-12-25 LAB — POCT GLYCOSYLATED HEMOGLOBIN (HGB A1C): Hemoglobin A1C: 7.3 % — AB (ref 4.0–5.6)

## 2021-12-25 NOTE — Progress Notes (Signed)
Patient ID: [name], female   DOB: 07/18/1931, 87 y.o.   MRN: [id number]  This visit occurred during the SARS-CoV-2 public health emergency.  Safety protocols were in place, including screening questions prior to the visit, additional usage of staff PPE, and extensive cleaning of exam room while observing appropriate contact time as indicated for disinfecting solutions.   HPI: [name] is a 87 y.o.-year-old female, self-referred (her daughter is also my patient), for management of DM2, dx in 1994, insulin-dependent, uncontrolled, with complications (CAD, s/p NSTEMI 2008; CHF; PAD; atrial fibrillation/atrial flutter; peripheral neuropathy; CKD stage IIIa).  She previously saw Dr. Loanne Drilling but daughter wanted her to start seeing me.  Last office visit with Dr. Loanne Drilling: 05/2021.  Last visit with me 2 months ago.  Patient's daughter accompanies her today and offers information about patient's diet, activity, and insulin doses.  Interim history: No increased urination, blurry vision, nausea, chest pain. She is in palliative care.  Reviewed HbA1c: Lab Results  Component Value Date   HGBA1C 9.0 (A) 09/14/2021   HGBA1C 8.3 (A) 05/28/2021   HGBA1C 8.0 (A) 03/24/2021   HGBA1C 9.5 (A) 12/02/2020   HGBA1C 7.8 (A) 08/21/2020   HGBA1C 8.3 (H) 05/15/2020   HGBA1C 8.0 (A) 05/06/2020   HGBA1C 9.2 (H) 02/20/2020   HGBA1C 7.9 (A) 11/14/2019   HGBA1C 7.6 (A) 08/22/2019   HGBA1C 8.1 (A) 05/01/2019   HGBA1C 8.9 (H) 01/31/2019   HGBA1C 7.0 (A) 12/06/2018   HGBA1C 6.5 (A) 09/20/2018   HGBA1C 6.4 (A) 07/19/2018   HGBA1C 7.3 05/08/2018   HGBA1C 8.0 (H) 01/30/2018   HGBA1C 8.3 11/23/2017   HGBA1C 7.4 08/23/2017   HGBA1C 8.2 01/24/2017   HGBA1C 7.7 09/08/2016   HGBA1C 8.4 (H) 06/09/2016   HGBA1C 7.8 (H) 03/04/2016   HGBA1C 7.6 12/08/2015   HGBA1C 8.3 08/26/2015   HGBA1C 7.9 (H) 05/24/2015   HGBA1C 8.6 (H) 02/06/2015   HGBA1C 8.7 (H) 11/15/2014   HGBA1C 8.1 (H) 08/21/2014   HGBA1C 9.6  (H) 01/21/2014   HGBA1C 9.8 (H) 09/13/2013   HGBA1C 9.7 (H) 05/15/2013   HGBA1C 10.9 (H) 02/01/2013   HGBA1C 8.9 (H) 09/26/2012   HGBA1C 9.9 (H) 05/29/2012   HGBA1C 8.1 (H) 09/06/2011   HGBA1C 8.3 (H) 07/13/2011   HGBA1C 6.8 (H) 06/15/2010   HGBA1C 7.6 (H) 05/12/2010   HGBA1C 6.9 (H) 02/03/2010    Pt is on a regimen of: - Humalog 75/25 >> 70/30 Novolin (24$ at [employer], w/o Rx) 32 units before breakfast and 6 units before dinner >> 28-30 units before breakfast >> 12 units 2x a day before meals >> 16 units in the a.m. and 12 units before dinner - Ozempic 0.25 >> 0.5 mg weekly-added 08/2021  Pt checks her sugars 2-4x a day and they are: - am: 84-195, 254 >> 109-153, 171, 181 >> 108-151, 165 - 2h after b'fast: n/c >> 154 >> 172 - before lunch: 229, 362 >> 125-194, 220 >> 248 - 2h after lunch: n/c  - before dinner: 130 >> 114-192, 269 >> 82-150, 182, 258 - 2h after dinner: 74, 116 >> 134, 223-230 >> 70, 159-187 - bedtime: n/c >> 265 >> 112-169, 270 >> 69-260 - nighttime: n/c >> 93-227 Lowest sugar was 64 >> 51; she has hypoglycemia awareness at 70.  Highest sugar was 359 >> 270 >> 260.  Glucometer: One Touch Verio IQ  Pt's meals are: - Breakfast: frozen banana, berries, oatmeal, protein powder But she has lunch and dinner in  the cafeteria ([employer]):   -+ History of CKD stage IIIa, last BUN/creatinine:  Lab Results  Component Value Date   BUN 31 (H) 12/17/2021   BUN 16 02/05/2021   CREATININE 1.24 (H) 12/17/2021   CREATININE 0.84 02/05/2021  She is not on an ACE inhibitor/ARB  -+ HL; last set of lipids: Lab Results  Component Value Date   CHOL 164 05/16/2020   HDL 50 05/16/2020   LDLCALC 96 05/16/2020   LDLDIRECT 144.6 02/03/2010   TRIG 90 05/16/2020   CHOLHDL 3.3 05/16/2020  She had muscle weakness from statins.   - last eye exam was on 11/16/2021: No DR  - no numbness and tingling in her feet.  Pt has FH of DM in mother.  She also has a  history of HTN, osteoporosis, protein-calorie malnutrition, iron deficiency anemia.  No FH of MTC, no pancreatitis hx.  ROS: + See HPI  Past Medical History:  Diagnosis Date   Anemia    iron defficiency   Anginal pain ([place])    Arthritis    "knees, feet, hands; joints" (05/27/2015)   Asthma    Atrial fibrillation or flutter    s/p RFCA 7/08;   s/p DCCV in past;   previously on amiodarone;  amio stopped due to lung toxicity   CAD (coronary artery disease)    s/p NSTEMI tx with BMS to OM1 3/08;  cath 3/08: pOM 99% tx with PCI, pLAD 20%, ? mod stenosis at the AM   CAP (community acquired pneumonia) 05/24/2015   CHF (congestive heart failure) ([place])    Chronic diastolic heart failure ([place])    echo 11/11:  EF 55-60%, severe LVH, mod LAE, mild MR, mildly increased PASP   COPD (chronic obstructive pulmonary disease) ([facility])    Degenerative joint disease    Dystrophy, corneal stromal    Heart murmur    HLD (hyperlipidemia)    HTN (hypertension)    essential nos   Hypopotassemia    PMH of   Muscle pain    Myocardial infarction ([place]) 2008   OSA on CPAP    Osteoporosis    Pneumonia 05/27/2015   Protein calorie malnutrition ([facility])    Rash and nonspecific skin eruption    both arms,awaiting bio   dr Delman Cheadle   Seasonal allergies    Shortness of breath dyspnea    Type II diabetes mellitus ([place])    Dr Loanne Drilling   Past Surgical History:  Procedure Laterality Date   A FLUTTER ABLATION     Dr Lovena Le   ABDOMINAL AORTOGRAM W/LOWER EXTREMITY Bilateral 04/11/2020   Procedure: ABDOMINAL AORTOGRAM W/LOWER EXTREMITY;  Surgeon: Angelia Mould, MD;  Location: [facility];  Service: Cardiovascular;  Laterality: Bilateral;   CATARACT EXTRACTION W/ INTRAOCULAR LENS  IMPLANT, BILATERAL Bilateral    CHOLECYSTECTOMY     COLONOSCOPY  6/07   2 polyps Dr. Kinnie Feil in [place]   colonoscopy with polypectomy      X 2; [facility]   CORNEAL TRANSPLANT Bilateral    CORONARY ANGIOPLASTY WITH STENT PLACEMENT   02/2007   BMS w/Dr Lovena Le   CORONARY ATHERECTOMY N/A 05/16/2020   Procedure: CORONARY ATHERECTOMY;  Surgeon: Martinique, Peter M, MD;  Location: [facility];  Service: Cardiovascular;  Laterality: N/A;   CORONARY BALLOON ANGIOPLASTY N/A 05/16/2020   Procedure: CORONARY BALLOON ANGIOPLASTY;  Surgeon: Martinique, Peter M, MD;  Location: [facility];  Service: Cardiovascular;  Laterality: N/A;   CORONARY STENT INTERVENTION  N/A 05/16/2020   Procedure: CORONARY STENT INTERVENTION;  Surgeon: Martinique, Peter M, MD;  Location: [facility];  Service: Cardiovascular;  Laterality: N/A;   EYE SURGERY     gravid 2 para 2     INTRAVASCULAR ULTRASOUND/IVUS N/A 05/16/2020   Procedure: Intravascular Ultrasound/IVUS;  Surgeon: Martinique, Peter M, MD;  Location: [facility];  Service: Cardiovascular;  Laterality: N/A;   KNEE CARTILAGE SURGERY Right    LEFT AND RIGHT HEART CATHETERIZATION WITH CORONARY ANGIOGRAM N/A 05/07/2014   Procedure: LEFT AND RIGHT HEART CATHETERIZATION WITH CORONARY ANGIOGRAM;  Surgeon: Peter M Martinique, MD;  Location[facility];  Service: Cardiovascular;  Laterality: N/A;   PERIPHERAL VASCULAR INTERVENTION  04/11/2020   Procedure: PERIPHERAL VASCULAR INTERVENTION;  Surgeon: Angelia Mould, MD;  Location: [facility];  Service: Cardiovascular;;   RIGHT HEART CATH N/A 02/04/2021   Procedure: RIGHT HEART CATH;  Surgeon: Jolaine Artist, MD;  Location: [facility];  Service: Cardiovascular;  Laterality: N/A;   RIGHT/LEFT HEART CATH AND CORONARY ANGIOGRAPHY N/A 05/16/2020   Procedure: RIGHT/LEFT HEART CATH AND CORONARY ANGIOGRAPHY;  Surgeon: Jolaine Artist, MD;  Location: [facility];  Service: Cardiovascular;  Laterality: N/A;   TOTAL KNEE ARTHROPLASTY Right 06/28/2016   Procedure: TOTAL KNEE ARTHROPLASTY;  Surgeon: Frederik Pear, MD;  Location: [facility];  Service: Orthopedics;  Laterality: Right;   Social History   Socioeconomic History   Marital  status: Widowed    Spouse name: Not on file   Number of children: 2   Years of education: 16   Highest education level: Bachelor's degree (e.g., BA, AB, BS)  Occupational History   Occupation: Teacher/ Retired  Tobacco Use   Smoking status: Former    Packs/day: 0.25    Years: 18.00    Pack years: 4.50    Types: Cigarettes    Quit date: 12/27/1968    Years since quitting: 53.0   Smokeless tobacco: Never   Tobacco comments:    smoked [place], up to 1 pp week  Vaping Use   Vaping Use: Never used  Substance and Sexual Activity   Alcohol use: Yes    Comment: occ   Drug use: No   Sexual activity: Never  Other Topics Concern   Not on file  Social History Narrative   Teacher. Widowed. Rarely drinks cafeine.    Social Determinants of Health   Financial Resource Strain: Low Risk    Difficulty of Paying Living Expenses: Not hard at all  Food Insecurity: No Food Insecurity   Worried About Charity fundraiser in the Last Year: Never true   [place] in the Last Year: Never true  Transportation Needs: No Transportation Needs   Lack of Transportation (Medical): No   Lack of Transportation (Non-Medical): No  Physical Activity: Sufficiently Active   Days of Exercise per Week: 5 days   Minutes of Exercise per Session: 30 min  Stress: No Stress Concern Present   Feeling of Stress : Not at all  Social Connections: Moderately Integrated   Frequency of Communication with Friends and Family: More than three times a week   Frequency of Social Gatherings with Friends and Family: More than three times a week   Attends Religious Services: More than 4 times per year   Active Member of [employer] or Organizations: Yes   Attends Archivist Meetings: More than 4 times per year   Marital Status: Widowed  Intimate Partner Violence: Not At Risk   Fear of Current or Ex-Partner: No   Emotionally Abused: No   Physically Abused: No   Sexually Abused: No   Current Outpatient  Medications on File Prior to Visit  Medication Sig Dispense Refill   acetaminophen (TYLENOL) 325 MG tablet Take 2 tablets (650 mg total) by mouth every 6 (six) hours as needed for mild pain or headache. 20 tablet 0   amLODipine (NORVASC) 2.5 MG tablet TAKE 1 TABLET BY MOUTH  DAILY AND MAY TAKE EXTRA  2.5MG  FOR SYSTOLIC BLOOD  PRESSURE OVER 180 180 tablet 3   carvedilol (COREG) 6.25 MG tablet TAKE 1 TABLET BY MOUTH  TWICE DAILY WITH MEALS 180 tablet 3   ELIQUIS 5 MG TABS tablet TAKE 1 TABLET BY MOUTH  TWICE DAILY 60 tablet 11   glucose blood (ONETOUCH VERIO) test strip 1 each by Other route 2 (two) times daily. And lancets 2/day 200 strip 3   insulin aspart protamine- aspart (NOVOLOG MIX 70/30) (70-30) 100 UNIT/ML injection Inject 16 Units into the skin daily with breakfast. 12 units in the PM     Lancet Devices (LANCING DEVICE) MISC Use as advised - for Delica Lancets 1 each 0   Lancets (ONETOUCH DELICA PLUS [id number]) MISC USE TO MONITOR GLUCOSE  LEVELS TWICE DAILY 200 each 3   Multiple Vitamins-Minerals (CENTRUM SILVER ULTRA WOMENS PO) Take 1 tablet by mouth daily.     nitroGLYCERIN (NITROSTAT) 0.4 MG SL tablet Place 1 tablet (0.4 mg total) under the tongue every 5 (five) minutes as needed for chest pain. Reported on 01/28/2016 30 tablet 3   NOVOFINE PLUS PEN NEEDLE 32G X 4 MM MISC SMARTSIG:1 Each SUB-Q Daily 200 each 1   OXYGEN Inhale 3 Doses into the lungs as needed. 3 liters     potassium chloride SA (KLOR-CON) 20 MEQ tablet TAKE 2 TABLETS BY MOUTH  DAILY 180 tablet 3   Semaglutide,0.25 or 0.5MG /DOS, (OZEMPIC, 0.25 OR 0.5 MG/DOSE,) 2 MG/1.5ML SOPN Inject 0.5 mg into the skin once a week. 4.5 mL 3   torsemide (DEMADEX) 20 MG tablet TAKE 2 TABLETS BY MOUTH  TWICE DAILY 120 tablet 11   No current facility-administered medications on file prior to visit.   Allergies  Allergen Reactions   Levaquin [Levofloxacin] Shortness Of Breath and Swelling    angioedema   Lobster [Shellfish Allergy] Other  (See Comments)    angioedema   Penicillins Rash    Has patient had a PCN reaction causing immediate rash, facial/tongue/throat swelling, SOB or lightheadedness with hypotension: Yes Has patient had a PCN reaction causing severe rash involving mucus membranes or skin necrosis: No Has patient had a PCN reaction that required hospitalization: No Has patient had a PCN reaction occurring within the last 10 years: No If all of the above answers are "NO", then may proceed with Cephalosporin use.   Valsartan Other (See Comments)    REACTION: angioedema   Codeine Other (See Comments)    Mental status changes   Lipitor [Atorvastatin] Other (See Comments)    weakness   Oxycodone Other (See Comments)    hallucinations   Statins     Made her too weak   Tramadol    Amlodipine Besylate Other (See Comments)    REACTION: tingling in lips & gum edema 7/12: talked to patient, states she is tolerating well   Crestor [Rosuvastatin Calcium] Rash   Tramadol Hcl Nausea And Vomiting   Family History  Problem Relation Age  of Onset   Diabetes Mother    Hypertension Mother    Transient ischemic attack Mother    Arthritis Mother    Heart attack Father 108   Arthritis Father    Hypertension Father    Breast cancer Maternal Aunt    Arthritis Maternal Aunt    PE: BP 130/68 (BP Location: Right Arm, Patient Position: Sitting, Cuff Size: Normal)    Pulse 79    Ht 5\' 3"  (1.6 m)    Wt 165 lb 3.2 oz (74.9 kg)    SpO2 90%    BMI 29.26 kg/m  Wt Readings from Last 3 Encounters:  12/25/21 165 lb 3.2 oz (74.9 kg)  12/17/21 166 lb (75.3 kg)  10/21/21 168 lb 3.2 oz (76.3 kg)   Constitutional: overweight, in NAD Eyes: PERRLA, EOMI, no exophthalmos ENT: moist mucous membranes, no thyromegaly, no cervical lymphadenopathy Cardiovascular: RRR, No MRG, B pitting LE edema - wears compression hoses Respiratory: CTA B Musculoskeletal: no deformities, strength intact in all 4 Skin: moist, warm, + stasis dermatitis rash -  bilateral lower legs Neurological: no tremor with outstretched hands, DTR normal in all 4  ASSESSMENT: 1. DM2, insulin-dependent, uncontrolled, with complications - CAD, s/p NSTEMI 2008 - CHF - PAD - atrial fibrillation/atrial flutter -previously on amiodarone - peripheral neuropathy - CKD stage IIIa  2. Obesity class 1  3. HL  PLAN:  1. Patient with longstanding, uncontrolled, type 2 diabetes, on a premixed insulin regimen, to which we added a weekly GLP-1 receptor agonist.  At last visit, sugars appear to have improved from before even though she just increase the Ozempic dose to 0.5 mg weekly in the previous few days.  At that time, sugars were at goal in the morning but they were increasing before and after dinner so we increased her premixed insulin dose in the morning.  We also discussed about the importance of injecting this 30 minutes before meals.  I again emphasized the need to start protein shakes between meals.  Latest HbA1c from 08/2021 was 9.0%. -we gave her paperwork to join [employer] patient assistance program since [place] was too expensive.  She is still waiting for response from them.  However, after the first of the year, she would be out of the donut hole and Ozempic will become more affordable then. -At today's visit, sugars are mostly at goal in the morning with only occasional slightly higher blood sugars after dietary indiscretions the night before.  In the situations, blood sugars after dinner can be quite high, well into the 200s.  We discussed that she can increase the dose of the premixed insulin before dinner with a larger meal, especially during the holidays, but otherwise, we can continue the same regimen. - I suggested to:  Patient Instructions  Please continue: - Novolin 70/30 16 units 30 min before breakfast and 12 units 30 min before dinner - Ozempic 0.5 mg weekly  Please return in 3 months with your sugar log.   -CBG targets for treatment for  her are: 80-150 mg/dL before meals and <180-200 mg/dL after meals; target HbA1c <7.4% (due to age). - we checked her HbA1c: 7.3% (much better) - advised to check sugars at different times of the day - 2-4x a day, rotating check times - advised for yearly eye exams >> she is UTD - return to clinic in 3 months  2.  Obesity class I -At previous visits, I suggested to increase intake of fresh fruit, dried fruit, and  other snacks.  I also recommended to try to switch from smoothies in the morning to oatmeal. -At last visit I again advised her to stop protein shakes between meals -We will continue Ozempic which should also help with weight loss -She lost 4 pounds before last visit and 3 pounds more since then  3. HL -Reviewed latest lipid panel from 04/2020: LDL above our target of less than 70, the rest of the fractions at goal: Lab Results  Component Value Date   CHOL 164 05/16/2020   HDL 50 05/16/2020   LDLCALC 96 05/16/2020   LDLDIRECT 144.6 02/03/2010   TRIG 90 05/16/2020   CHOLHDL 3.3 05/16/2020  -She is not on a statin due to previous muscle weakness -She refused further treatment for this.  Per Dr. Haroldine Laws, no lipid panels ultrasound are recommended.  Philemon Kingdom, MD PhD [facility] Endocrinology

## 2021-12-25 NOTE — Patient Instructions (Signed)
Please continue: - Novolin 70/30 16 units 30 min before breakfast and 12 units 30 min before dinner - Ozempic 0.5 mg weekly  Please return in 3 months with your sugar log.

## 2021-12-26 DIAGNOSIS — E1122 Type 2 diabetes mellitus with diabetic chronic kidney disease: Secondary | ICD-10-CM | POA: Diagnosis not present

## 2021-12-26 DIAGNOSIS — I5032 Chronic diastolic (congestive) heart failure: Secondary | ICD-10-CM | POA: Diagnosis not present

## 2021-12-26 DIAGNOSIS — Z794 Long term (current) use of insulin: Secondary | ICD-10-CM | POA: Diagnosis not present

## 2021-12-26 DIAGNOSIS — N1831 Chronic kidney disease, stage 3a: Secondary | ICD-10-CM | POA: Diagnosis not present

## 2021-12-30 ENCOUNTER — Encounter: Payer: Self-pay | Admitting: Pulmonary Disease

## 2022-01-01 ENCOUNTER — Ambulatory Visit (INDEPENDENT_AMBULATORY_CARE_PROVIDER_SITE_OTHER): Payer: Medicare Other | Admitting: *Deleted

## 2022-01-01 DIAGNOSIS — N1831 Chronic kidney disease, stage 3a: Secondary | ICD-10-CM

## 2022-01-01 DIAGNOSIS — E1122 Type 2 diabetes mellitus with diabetic chronic kidney disease: Secondary | ICD-10-CM

## 2022-01-01 DIAGNOSIS — I5032 Chronic diastolic (congestive) heart failure: Secondary | ICD-10-CM

## 2022-01-01 NOTE — Chronic Care Management (AMB) (Signed)
Chronic Care Management   CCM RN Visit Note  01/01/2022 Name: [name] MRN: [id number] DOB: Feb 04, 1931  Subjective: [name] is a 88 y.o. year old female who is a primary care patient of Burns, [name], MD. The care management team was consulted for assistance with disease management and care coordination needs.    Engaged with patient by telephone for follow up visit in response to provider referral for case management and/or care coordination services.   Consent to Services:  The patient was given information about Chronic Care Management services, agreed to services, and gave verbal consent prior to initiation of services.  Please see initial visit note for detailed documentation.  Patient agreed to services and verbal consent obtained.   Assessment: Review of patient past medical history, allergies, medications, health status, including review of consultants reports, laboratory and other test data, was performed as part of comprehensive evaluation and provision of chronic care management services.   CCM Care Plan Allergies  Allergen Reactions   Levaquin [Levofloxacin] Shortness Of Breath and Swelling    angioedema   Lobster [Shellfish Allergy] Other (See Comments)    angioedema   Penicillins Rash    Has patient had a PCN reaction causing immediate rash, facial/tongue/throat swelling, SOB or lightheadedness with hypotension: Yes Has patient had a PCN reaction causing severe rash involving mucus membranes or skin necrosis: No Has patient had a PCN reaction that required hospitalization: No Has patient had a PCN reaction occurring within the last 10 years: No If all of the above answers are "NO", then may proceed with Cephalosporin use.   Valsartan Other (See Comments)    REACTION: angioedema   Codeine Other (See Comments)    Mental status changes   Lipitor [Atorvastatin] Other (See Comments)    weakness   Oxycodone Other (See Comments)    hallucinations    Statins     Made her too weak   Tramadol    Amlodipine Besylate Other (See Comments)    REACTION: tingling in lips & gum edema 7/12: talked to patient, states she is tolerating well   Crestor [Rosuvastatin Calcium] Rash   Tramadol Hcl Nausea And Vomiting   Outpatient Encounter Medications as of 01/01/2022  Medication Sig   acetaminophen (TYLENOL) 325 MG tablet Take 2 tablets (650 mg total) by mouth every 6 (six) hours as needed for mild pain or headache.   amLODipine (NORVASC) 2.5 MG tablet TAKE 1 TABLET BY MOUTH  DAILY AND MAY TAKE EXTRA  2.5MG  FOR SYSTOLIC BLOOD  PRESSURE OVER 180   carvedilol (COREG) 6.25 MG tablet TAKE 1 TABLET BY MOUTH  TWICE DAILY WITH MEALS   ELIQUIS 5 MG TABS tablet TAKE 1 TABLET BY MOUTH  TWICE DAILY   glucose blood (ONETOUCH VERIO) test strip 1 each by Other route 2 (two) times daily. And lancets 2/day   insulin aspart protamine- aspart (NOVOLOG MIX 70/30) (70-30) 100 UNIT/ML injection Inject 16 Units into the skin daily with breakfast. 12 units in the PM   Lancet Devices (LANCING DEVICE) MISC Use as advised - for Delica Lancets   Lancets (ONETOUCH DELICA PLUS [id number]) MISC USE TO MONITOR GLUCOSE  LEVELS TWICE DAILY   Multiple Vitamins-Minerals (CENTRUM SILVER ULTRA WOMENS PO) Take 1 tablet by mouth daily.   nitroGLYCERIN (NITROSTAT) 0.4 MG SL tablet Place 1 tablet (0.4 mg total) under the tongue every 5 (five) minutes as needed for chest pain. Reported on 01/28/2016   NOVOFINE PLUS PEN NEEDLE 32G X 4  MM MISC SMARTSIG:1 Each SUB-Q Daily   OXYGEN Inhale 3 Doses into the lungs as needed. 3 liters   potassium chloride SA (KLOR-CON) 20 MEQ tablet TAKE 2 TABLETS BY MOUTH  DAILY   Semaglutide,0.25 or 0.5MG /DOS, (OZEMPIC, 0.25 OR 0.5 MG/DOSE,) 2 MG/1.5ML SOPN Inject 0.5 mg into the skin once a week.   torsemide (DEMADEX) 20 MG tablet TAKE 2 TABLETS BY MOUTH  TWICE DAILY   No facility-administered encounter medications on file as of 01/01/2022.   Patient Active Problem  List   Diagnosis Date Noted   Chronic respiratory failure with hypoxia ([place]) 05/12/2021   Acute respiratory failure with hypoxia and hypercapnia ([facility]) 01/31/2021   Elevated troponin 01/31/2021   Poorly controlled type 2 diabetes mellitus with circulatory disorder ([place]) 01/31/2021   Neuropathy 08/19/2020   PAD (peripheral artery disease) ([place]) 02/29/2020   Chronic kidney disease (CKD) 02/20/2020   Osteoarthritis 08/01/2019   Arthritis of left sacroiliac joint 05/04/2017   Osteopenia 02/17/2017   Piriformis syndrome of left side 02/16/2017   Acquired leg length discrepancy 02/16/2017   COPD GOLD I  01/27/2017   Lumbar radiculopathy, acute 01/27/2017   Granuloma annulare 07/27/2016   Numbness of fingers 07/27/2016   Primary osteoarthritis of right knee 06/27/2016   Rash and nonspecific skin eruption 06/09/2016   Diabetes ([place]) 03/07/2016   Acute on chronic combined systolic (congestive) and diastolic (congestive) heart failure (HCC)    OSA  08/28/2014   Chronic diastolic heart failure ([place]) 07/30/2014   Nocturnal hypoxemia 07/09/2014   SOB (shortness of breath) 05/15/2014   Pulmonary HTN ([place]) 05/15/2014   Band keratopathy 01/16/2014   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   Hyperthyroidism 11/04/2011   Multinodular goiter 10/14/2011   Atrophy, Fuchs' 09/29/2011   ATRIAL FIBRILLATION 06/03/2010   VITILIGO 11/07/2009   SINOATRIAL NODE DYSFUNCTION 06/16/2009   Atrial flutter ([place]) 06/25/2008   HYPERLIPIDEMIA 01/29/2008   Coronary atherosclerosis 01/29/2008   HTN (hypertension) 10/30/2007   Conditions to be addressed/monitored:  CHF and DMII  Care Plan : RN Care Manager Plan of Care  Updates made by Knox Royalty, RN since 01/01/2022 12:00 AM     Problem: Chronic Disease Management Needs   Priority: High     Long-Range Goal: Development of plan of care for long term chronic disease management   Start Date: 10/27/2021  Expected End Date: 10/27/2022  Recent Progress: On  track  Priority: High  Note:   Current Barriers:  Chronic Disease Management support and education needs related to CHF and DMII- patient appreciaitive of ongoing support and reinforcement Chef prepared meals at ALF- difficult to follow prescribed diet  RNCM Clinical Goal(s):  Patient will demonstrate Ongoing health management independence DMII/ CHF  through collaboration with RN Care manager, provider, and care team.   Interventions: 1:1 collaboration with primary care provider regarding development and update of comprehensive plan of care as evidenced by provider attestation and co-signature Inter-disciplinary care team collaboration (see longitudinal plan of care) Evaluation of current treatment plan related to  self management and patient's adherence to plan as established by provider Pain assessment updated: denies acute/ chronic pain Falls assessment updated: no falls reported; continues using walker "all the time"  Diabetes Interventions:01/01/22: Goal Status: ON TRACK; Long-Term Goal Assessed patient's understanding of A1c goal: <7% Discussed plans with patient for ongoing care management follow up and provided patient with direct contact information for care management team Reviewed scheduled/upcoming provider appointments including: 03/26/22- endocrinology provider office visit Review of patient status,  including review of consultants reports, relevant laboratory and other test results, and medications completed Reviewed recent endocrinology office visit 12/25/21-- discussed excellent decrease in A1-C from 9.0 (09/14/21) to current 7.3 (12/25/21)-- we celebrated together-- patient is very pleased with her latest A1-C result-- positive reinforcement provided with encouragement to continue efforts Reviewed individual historical A1-C trends and provided education around correlation of A1-C value to blood sugar levels at home over 3 months Patient tells me she has "given up sugar in diet"  as a goal for 2023-- she has not had any sugar in her diet for 6 days-- states "I feel great;" positive reinforcement provided with encouragement to continue efforts Discussed patient's current difficulty affording Ozempic: reports she continues working with endocrinology team at Dr. Arman Filter office on patient assistance; has not yet heard back around status of PAP for Ozempic; discussed option of asking endocrinology team about switching to [employer] for their patient assistance program-- patient will consider, reports she hopes her out of pocket for Ozempic will decrease since she is "now out of the donunt hole" Confirmed patient continues to monitor blood sugars at home: reports "all look good;" in "good range" and she denies recent hypoglycemia Confirmed she continues to remain active-- going to yoga this morning, walking on nice days Confirmed patient continues to try and follow carb-modified/ and now "NO" sugar diet around chef prepared meals at ALF: discussed ongoing strategies she has tried in the past and plans to continue trying- positive reinforcement provided Reinforced self-health management strategies for routine daily self-care-- for both DMII/ CHF: patient has made great progress and will benefit from ongoing reinforcement and support  Lab Results  Component Value Date   HGBA1C 7.3 (A) 12/25/2021  Heart Failure Interventions: 01/01/22: Goal Status: ON TRACK; Long-Term Goal Discussed importance of daily weight and advised patient to weigh and record daily Discussed the importance of keeping all appointments with provider Provided patient with education about the role of exercise in the management of heart failure Reviewed recent CHF provider office visit: confirmed no medication changes/ concerns-- reports "got a great report" Confirmed patient continues monitoring/ recording daily weights at home: reports weight today of "164 lbs;" denies recent weight gain > 3 lbs overnight/ 5 lbs in  one week Confirmed continues to follow heart healthy, low salt, low cholesterol diet- positive reinforcement provided with encouragement to continue efforts Confirmed patient remains active at baseline Confirmed patient continues using home O2 prn-- reports "really helps;" but only uses when needed, currently using 2 L/min for short periods throughout day, as needed/ indicated; continues using CPAP at night as prescribed  Patient Goals/Self-Care Activities: Patient [name] will:  Take medications as prescribed Attend all scheduled provider appointments Call pharmacy for medication refills Call provider office for new concerns or questions Continue using home oxygen as prescribed and monitor your oxygen levels regularly at home: with activity and at rest, and if you ever start to feel unusually short of breath Continue to weigh daily: you reported weights at home today consistently between 164- 165 lbs; You are on track with maintaining your goal weight- keep up the great work tracking weight in your health journal Call office if I gain more than 2 pounds in one day or 5 pounds in one week: great job following your established action plan when you developed weight gain and shortness of breath (yellow zone) last week! Watch for swelling in feet, ankles and legs every day- if you experience new/ unusual swelling that does not go away promptly,  call your heart failure doctor Check blood sugar at prescribed times, or if I feel it is too high or too low.  Keep up entering blood sugar readings and medication or insulin into daily log Take the blood sugar log to all doctor visits Continue your excellent efforts to follow heart healthy, low salt, low sugar/ carb modified diet-- you have made great progress in following your prescribed diet: Keep up the great work! Keep up the great work staying active; and walking for exercise when the weather is nice- keep up the great work Continue working with Dr.  Arman Filter office team to obtain patient assistance for the medication (Ozempic) that is expensive  Follow Up Plan:   Telephone follow up appointment with care management team member scheduled for:  Monday, March 29, 2022 at 9:00 am The patient has been provided with contact information for the care management team and has been advised to call with any health related questions or concerns    Plan: The patient has been provided with contact information for the care management team and has been advised to call with any health related questions or concerns  [name], RN, BSN, [place] [phone]: direct office

## 2022-01-01 NOTE — Patient Instructions (Addendum)
Visit Gila, thank you for taking time to talk with me today. Please don't hesitate to contact me if I can be of assistance to you before our next scheduled telephone appointment.  Below are the goals we discussed today:  Patient Self-Care Activities: Patient [name] will:  Take medications as prescribed Attend all scheduled provider appointments Call pharmacy for medication refills Call provider office for new concerns or questions Continue using home oxygen as prescribed and monitor your oxygen levels regularly at home: with activity and at rest, and if you ever start to feel unusually short of breath Continue to weigh daily: you reported weights at home today consistently between 164- 165 lbs; You are on track with maintaining your goal weight- keep up the great work tracking weight in your health journal Call office if I gain more than 2 pounds in one day or 5 pounds in one week: great job following your established action plan when you developed weight gain and shortness of breath (yellow zone) last week! Watch for swelling in feet, ankles and legs every day- if you experience new/ unusual swelling that does not go away promptly, call your heart failure doctor Check blood sugar at prescribed times, or if I feel it is too high or too low.  Keep up entering blood sugar readings and medication or insulin into daily log Take the blood sugar log to all doctor visits Continue your excellent efforts to follow heart healthy, low salt, low sugar/ carb modified diet-- you have made great progress in following your prescribed diet: Keep up the great work! Keep up the great work staying active; and walking for exercise when the weather is nice- keep up the great work Continue working with Dr. Arman Filter office team to obtain patient assistance for the medication (Ozempic) that is expensive  Our next scheduled telephone follow up visit/ appointment with care management team member is  scheduled on:  Monday, March 29, 2022 at 9:00 am: this is a TELEPHONE CALL APPOINTMENT  If you need to cancel or re-schedule our visit, please call [phone] and our care guide team will be happy to assist you.   I look forward to hearing about your progress.   [name], RN, BSN, [place] [phone]: direct office  If you are experiencing a Mental Health or [place] or need someone to talk to, please  call the Suicide and Crisis Lifeline: 988 call the [place] National Suicide Prevention Lifeline: (608)844-7938 or TTY: 463-773-1407 TTY 415-468-0036) to talk to a trained counselor call 1-800-273-TALK (toll free, 24 hour hotline) go to [facility] [address] [place] [phone]) call 911   The patient verbalized understanding of instructions, educational materials, and care plan provided today and agreed to receive a mailed copy of patient instructions, educational materials, and care plan  Cholesterol Content in Foods Cholesterol is a waxy, fat-like substance that helps to carry fat in the blood. The body needs cholesterol in small amounts, but too much cholesterol can cause damage to the arteries and heart. What foods have cholesterol? Cholesterol is found in animal-based foods, such as meat, seafood, and dairy. Generally, low-fat dairy and lean meats have less cholesterol than full-fat dairy and fatty meats. The milligrams of cholesterol per serving (mg per serving) of common cholesterol-containing foods are listed below. Meats and other proteins Egg -- one large whole egg has 186 mg. Veal shank -- 4 oz (113  g) has 141 mg. Lean ground [place] (93% lean) -- 4 oz (113 g) has 118 mg. Fat-trimmed lamb loin -- 4 oz (113 g) has 106 mg. Lean ground beef (90% lean) -- 4 oz (113 g) has 100 mg. Lobster -- 3.5 oz (99 g) has 90 mg. Pork loin chops -- 4 oz (113 g) has  86 mg. Canned salmon -- 3.5 oz (99 g) has 83 mg. Fat-trimmed beef top loin -- 4 oz (113 g) has 78 mg. Frankfurter -- 1 frank (3.5 oz or 99 g) has 77 mg. Crab -- 3.5 oz (99 g) has 71 mg. Roasted chicken without skin, white meat -- 4 oz (113 g) has 66 mg. Light bologna -- 2 oz (57 g) has 45 mg. Deli-cut [place] -- 2 oz (57 g) has 31 mg. Canned tuna -- 3.5 oz (99 g) has 31 mg. Berniece Salines -- 1 oz (28 g) has 29 mg. Oysters and mussels (raw) -- 3.5 oz (99 g) has 25 mg. Mackerel -- 1 oz (28 g) has 22 mg. Trout -- 1 oz (28 g) has 20 mg. Pork sausage -- 1 link (1 oz or 28 g) has 17 mg. Salmon -- 1 oz (28 g) has 16 mg. Tilapia -- 1 oz (28 g) has 14 mg. Dairy Soft-serve ice cream --  cup (4 oz or 86 g) has 103 mg. Whole-milk yogurt -- 1 cup (8 oz or 245 g) has 29 mg. Cheddar cheese -- 1 oz (28 g) has 28 mg. American cheese -- 1 oz (28 g) has 28 mg. Whole milk -- 1 cup (8 oz or 250 mL) has 23 mg. 2% milk -- 1 cup (8 oz or 250 mL) has 18 mg. Cream cheese -- 1 tablespoon (Tbsp) (14.5 g) has 15 mg. Cottage cheese --  cup (4 oz or 113 g) has 14 mg. Low-fat (1%) milk -- 1 cup (8 oz or 250 mL) has 10 mg. Sour cream -- 1 Tbsp (12 g) has 8.5 mg. Low-fat yogurt -- 1 cup (8 oz or 245 g) has 8 mg. Nonfat Greek yogurt -- 1 cup (8 oz or 228 g) has 7 mg. Half-and-half cream -- 1 Tbsp (15 mL) has 5 mg. Fats and oils Cod liver oil -- 1 tablespoon (Tbsp) (13.6 g) has 82 mg. Butter -- 1 Tbsp (14 g) has 15 mg. Lard -- 1 Tbsp (12.8 g) has 14 mg. Bacon grease -- 1 Tbsp (12.9 g) has 14 mg. Mayonnaise -- 1 Tbsp (13.8 g) has 5-10 mg. Margarine -- 1 Tbsp (14 g) has 3-10 mg. The items listed above may not be a complete list of foods with cholesterol. Exact amounts of cholesterol in these foods may vary depending on specific ingredients and brands. Contact a dietitian for more information. What foods do not have cholesterol? Most plant-based foods do not have cholesterol unless you combine them with a food that has  cholesterol. Foods without cholesterol include: Grains and cereals. Vegetables. Fruits. Vegetable oils, such as olive, canola, and sunflower oil. Legumes, such as peas, beans, and lentils. Nuts and seeds. Egg whites. The items listed above may not be a complete list of foods that do not have cholesterol. Contact a dietitian for more information. Summary The body needs cholesterol in small amounts, but too much cholesterol can cause damage to the arteries and heart. Cholesterol is found in animal-based foods, such as meat, seafood, and dairy. Generally, low-fat dairy and lean meats have less cholesterol than full-fat dairy and fatty meats. This information  is not intended to replace advice given to you by your health care provider. Make sure you discuss any questions you have with your health care provider. Document Revised: 04/24/2021 Document Reviewed: 04/24/2021 Elsevier Patient Education  [place].

## 2022-01-03 ENCOUNTER — Encounter: Payer: Self-pay | Admitting: Internal Medicine

## 2022-01-04 ENCOUNTER — Encounter: Payer: Self-pay | Admitting: Internal Medicine

## 2022-01-04 MED ORDER — AZITHROMYCIN 500 MG PO TABS: 500.0000 mg | ORAL_TABLET | Freq: Once | ORAL | 0 refills | Status: DC

## 2022-01-07 ENCOUNTER — Telehealth (HOSPITAL_COMMUNITY): Payer: Self-pay | Admitting: *Deleted

## 2022-01-07 NOTE — Telephone Encounter (Signed)
Pt left VM stating she has been taking her Amlodipine 2.5 mg Daily, has not had to take any extra. She is concerned BP maybe running a little low, it's been 135-140s//58-60 and she is feeling a little dizzy at times, also reports wt is down about 6-7 lbs.  Attempted to call pt back to discuss, n/a on home #, lm tcb on mobile #

## 2022-01-08 ENCOUNTER — Encounter (HOSPITAL_COMMUNITY): Payer: Self-pay | Admitting: *Deleted

## 2022-01-08 NOTE — Telephone Encounter (Signed)
Unable to reach pt to discuss further, lm tcb on home and mobile #s. Mychart message sent

## 2022-01-12 DIAGNOSIS — H903 Sensorineural hearing loss, bilateral: Secondary | ICD-10-CM | POA: Insufficient documentation

## 2022-01-21 ENCOUNTER — Other Ambulatory Visit (HOSPITAL_COMMUNITY): Payer: Self-pay | Admitting: Internal Medicine

## 2022-01-26 DIAGNOSIS — I5032 Chronic diastolic (congestive) heart failure: Secondary | ICD-10-CM

## 2022-01-26 DIAGNOSIS — N1831 Chronic kidney disease, stage 3a: Secondary | ICD-10-CM | POA: Diagnosis not present

## 2022-01-26 DIAGNOSIS — E1122 Type 2 diabetes mellitus with diabetic chronic kidney disease: Secondary | ICD-10-CM | POA: Diagnosis not present

## 2022-01-26 DIAGNOSIS — Z794 Long term (current) use of insulin: Secondary | ICD-10-CM | POA: Diagnosis not present

## 2022-03-26 ENCOUNTER — Encounter: Payer: Self-pay | Admitting: Internal Medicine

## 2022-03-26 ENCOUNTER — Ambulatory Visit: Payer: Medicare Other | Admitting: Internal Medicine

## 2022-03-26 VITALS — BP 130/80 | HR 68 | Ht 63.0 in | Wt 166.8 lb

## 2022-03-26 DIAGNOSIS — E669 Obesity, unspecified: Secondary | ICD-10-CM

## 2022-03-26 DIAGNOSIS — E1122 Type 2 diabetes mellitus with diabetic chronic kidney disease: Secondary | ICD-10-CM

## 2022-03-26 DIAGNOSIS — E782 Mixed hyperlipidemia: Secondary | ICD-10-CM | POA: Diagnosis not present

## 2022-03-26 DIAGNOSIS — Z794 Long term (current) use of insulin: Secondary | ICD-10-CM

## 2022-03-26 DIAGNOSIS — N1831 Chronic kidney disease, stage 3a: Secondary | ICD-10-CM

## 2022-03-26 LAB — POCT GLYCOSYLATED HEMOGLOBIN (HGB A1C): Hemoglobin A1C: 8.4 % — AB (ref 4.0–5.6)

## 2022-03-26 MED ORDER — OZEMPIC (0.25 OR 0.5 MG/DOSE) 2 MG/1.5ML ~~LOC~~ SOPN: 0.5000 mg | PEN_INJECTOR | SUBCUTANEOUS | 3 refills | Status: DC

## 2022-03-26 NOTE — Progress Notes (Signed)
Patient ID: [name], female   DOB: 18-Jul-1931, 88 y.o.   MRN: [id number] ? ?This visit occurred during the SARS-CoV-2 public health emergency.  Safety protocols were in place, including screening questions prior to the visit, additional usage of staff PPE, and extensive cleaning of exam room while observing appropriate contact time as indicated for disinfecting solutions.  ? ?HPI: ?[name] is a 88 y.o.-year-old female, self-referred (her daughter is also my patient), for management of DM2, dx in 1994, insulin-dependent, uncontrolled, with complications (CAD, s/p NSTEMI 2008; CHF; PAD; atrial fibrillation/atrial flutter; peripheral neuropathy; CKD stage IIIa).  She previously saw Dr. Loanne Drilling but daughter wanted her to start seeing me.   Last visit with me 3 months ago.  Patient's daughter accompanies her today and offers information about patient's diet, activity, and insulin doses. ? ?Interim history: ?No increased urination, blurry vision, nausea, chest pain.  She does feel more tired and sluggish, however. ?She is in palliative care. ?She fell of the wagon with diet, blood sugar checks and medication doses as she is very stressed with her son having recurrent cancer. ? ?Reviewed HbA1c: ?Lab Results  ?Component Value Date  ? HGBA1C 7.3 (A) 12/25/2021  ? HGBA1C 9.0 (A) 09/14/2021  ? HGBA1C 8.3 (A) 05/28/2021  ? HGBA1C 8.0 (A) 03/24/2021  ? HGBA1C 9.5 (A) 12/02/2020  ? HGBA1C 7.8 (A) 08/21/2020  ? HGBA1C 8.3 (H) 05/15/2020  ? HGBA1C 8.0 (A) 05/06/2020  ? HGBA1C 9.2 (H) 02/20/2020  ? HGBA1C 7.9 (A) 11/14/2019  ? HGBA1C 7.6 (A) 08/22/2019  ? HGBA1C 8.1 (A) 05/01/2019  ? HGBA1C 8.9 (H) 01/31/2019  ? HGBA1C 7.0 (A) 12/06/2018  ? HGBA1C 6.5 (A) 09/20/2018  ? HGBA1C 6.4 (A) 07/19/2018  ? HGBA1C 7.3 05/08/2018  ? HGBA1C 8.0 (H) 01/30/2018  ? HGBA1C 8.3 11/23/2017  ? HGBA1C 7.4 08/23/2017  ? HGBA1C 8.2 01/24/2017  ? HGBA1C 7.7 09/08/2016  ? HGBA1C 8.4 (H) 06/09/2016  ? HGBA1C 7.8 (H) 03/04/2016  ? HGBA1C  7.6 12/08/2015  ? HGBA1C 8.3 08/26/2015  ? HGBA1C 7.9 (H) 05/24/2015  ? HGBA1C 8.6 (H) 02/06/2015  ? HGBA1C 8.7 (H) 11/15/2014  ? HGBA1C 8.1 (H) 08/21/2014  ? HGBA1C 9.6 (H) 01/21/2014  ? HGBA1C 9.8 (H) 09/13/2013  ? HGBA1C 9.7 (H) 05/15/2013  ? HGBA1C 10.9 (H) 02/01/2013  ? HGBA1C 8.9 (H) 09/26/2012  ? HGBA1C 9.9 (H) 05/29/2012  ? HGBA1C 8.1 (H) 09/06/2011  ? HGBA1C 8.3 (H) 07/13/2011  ? HGBA1C 6.8 (H) 06/15/2010  ? HGBA1C 7.6 (H) 05/12/2010  ? ?Pt is on a regimen of: ?- Humalog 75/25 >> 70/30 Novolin (24$ at [facility], w/o Rx) 32 units before breakfast and 6 units before dinner >> 28-30 units before breakfast >> 12 units 2x a day before meals >> 16 units in the a.m. and 12 units before dinner ?- Ozempic 0.25 >> 0.5 mg weekly-added 08/2021 >> stopped 12/2021 2/2 $$ but now new insurance ? ?Pt checks her sugars 2-4x a day and they are: ?- am: 84-195, 254 >> 109-153, 171, 181 >> 108-151, 165 >> 104-184, 215 ?- 2h after b'fast: n/c >> 154 >> 172 >> n/c ?- before lunch: 229, 362 >> 125-194, 220 >> 248 >> 189 ?- 2h after lunch: n/c  >> 148 ?- before dinner: 130 >> 114-192, 269 >> 82-150, 182, 258 >> 129, 144 ?- 2h after dinner: 74, 116 >> 134, 223-230 >> 70, 159-187 >> 420 ?- bedtime: n/c >> 265 >> 112-169, 270 >> 69-260 >> 115-196, 233 ?-  nighttime: n/c >> 93-227 >> 186-286 ?Lowest sugar was 64 >> 51 >> 104; she has hypoglycemia awareness at 70.  ?Highest sugar was 359 >> 270 >> 260>> 420. ? ?Glucometer: One Probation officer IQ ? ?Pt's meals are: ?- Breakfast: frozen banana, berries, oatmeal, protein powder ?But she has lunch and dinner in the cafeteria ([place] - facility: [place] [facility]): ? ? ?-+ History of CKD stage IIIa, last BUN/creatinine:  ?Lab Results  ?Component Value Date  ? BUN 31 (H) 12/17/2021  ? BUN 16 02/05/2021  ? CREATININE 1.24 (H) 12/17/2021  ? CREATININE 0.84 02/05/2021  ?She is not on an ACE inhibitor/ARB ? ?-+ HL; last set of lipids: ?Lab Results  ?Component Value Date  ? CHOL 164  05/16/2020  ? HDL 50 05/16/2020  ? [place] 96 05/16/2020  ? LDLDIRECT 144.6 02/03/2010  ? TRIG 90 05/16/2020  ? CHOLHDL 3.3 05/16/2020  ?She had muscle weakness from statins.  ? ?- last eye exam was on 11/16/2021: No DR ? ?- no numbness and tingling in her feet.  She saw podiatry before and would like to go back to see them. ? ?Pt has FH of DM in mother. ? ?She also has a history of HTN, osteoporosis, protein-calorie malnutrition, iron deficiency anemia. ? ?No FH of MTC, no pancreatitis hx. ? ?ROS: ?+ See HPI ? ?Past Medical History:  ?Diagnosis Date  ? Anemia   ? iron defficiency  ? Anginal pain ([place])   ? Arthritis   ? "knees, feet, hands; joints" (05/27/2015)  ? Asthma   ? Atrial fibrillation or flutter   ? s/p RFCA 7/08;   s/p DCCV in past;   previously on amiodarone;  amio stopped due to lung toxicity  ? CAD (coronary artery disease)   ? s/p NSTEMI tx with BMS to OM1 3/08;  cath 3/08: pOM 99% tx with PCI, pLAD 20%, ? mod stenosis at the AM  ? CAP (community acquired pneumonia) 05/24/2015  ? CHF (congestive heart failure) ([place])   ? Chronic diastolic heart failure ([place])   ? echo 11/11:  EF 55-60%, severe LVH, mod LAE, mild MR, mildly increased PASP  ? COPD (chronic obstructive pulmonary disease) ([place])   ? Degenerative joint disease   ? Dystrophy, corneal stromal   ? Heart murmur   ? HLD (hyperlipidemia)   ? HTN (hypertension)   ? essential nos  ? Hypopotassemia   ? PMH of  ? Muscle pain   ? Myocardial infarction [facility]) 2008  ? OSA on CPAP   ? Osteoporosis   ? Pneumonia 05/27/2015  ? Protein calorie malnutrition ([place])   ? Rash and nonspecific skin eruption   ? both arms,awaiting bio   dr Delman Cheadle  ? Seasonal allergies   ? Shortness of breath dyspnea   ? Type II diabetes mellitus ([place])   ? Dr Loanne Drilling  ? ?Past Surgical History:  ?Procedure Laterality Date  ? A FLUTTER ABLATION    ? Dr Lovena Le  ? ABDOMINAL AORTOGRAM W/LOWER EXTREMITY Bilateral 04/11/2020  ? Procedure: ABDOMINAL AORTOGRAM W/LOWER EXTREMITY;  Surgeon: Angelia Mould, MD;  Location: [place] [facility];  Service: Cardiovascular;  Laterality: Bilateral;  ? CATARACT EXTRACTION W/ INTRAOCULAR LENS  IMPLANT, BILATERAL Bilateral   ? CHOLECYSTECTOMY    ? COLONOSCOPY  6/07  ? 2 polyps Dr. Kinnie Feil in [place]  ? colonoscopy with polypectomy    ?  X 2;  GI  ? CORNEAL TRANSPLANT Bilateral   ? CORONARY ANGIOPLASTY WITH STENT  PLACEMENT  02/2007  ? BMS w/Dr Lovena Le  ? CORONARY ATHERECTOMY N/A 05/16/2020  ? Procedure: CORONARY ATHERECTOMY;  Surgeon: Martinique, Peter M, MD;  Location: [place] [facility];  Service: Cardiovascular;  Laterality: N/A;  ? CORONARY BALLOON ANGIOPLASTY N/A 05/16/2020  ? Procedure: CORONARY BALLOON ANGIOPLASTY;  Surgeon: Martinique, Peter M, MD;  Location: [place] [facility];  Service: Cardiovascular;  Laterality: N/A;  ? CORONARY STENT INTERVENTION N/A 05/16/2020  ? Procedure: CORONARY STENT INTERVENTION;  Surgeon: Martinique, Peter M, MD;  Location: [place] [facility];  Service: Cardiovascular;  Laterality: N/A;  ? EYE SURGERY    ? gravid 2 para 2    ? INTRAVASCULAR ULTRASOUND/IVUS N/A 05/16/2020  ? Procedure: Intravascular Ultrasound/IVUS;  Surgeon: Martinique, Peter M, MD;  Location: [place] [facility];  Service: Cardiovascular;  Laterality: N/A;  ? KNEE CARTILAGE SURGERY Right   ? LEFT AND RIGHT HEART CATHETERIZATION WITH CORONARY ANGIOGRAM N/A 05/07/2014  ? Procedure: LEFT AND RIGHT HEART CATHETERIZATION WITH CORONARY ANGIOGRAM;  Surgeon: Peter M Martinique, MD;  Location: [facility] C[facility] LAB;  Service: Cardiovascular;  Laterality: N/A;  ? PERIPHERAL VASCULAR INTERVENTION  04/11/2020  ? Procedure: PERIPHERAL VASCULAR INTERVENTION;  Surgeon: Angelia Mould, MD;  Location: [place] [facility];  Service: Cardiovascular;;  ? RIGHT HEART CATH N/A 02/04/2021  ? Procedure: RIGHT HEART CATH;  Surgeon: Jolaine Artist, MD;  Location: [place] [facility];  Service: Cardiovascular;  Laterality: N/A;  ? RIGHT/LEFT HEART CATH AND CORONARY ANGIOGRAPHY N/A 05/16/2020  ? Procedure: RIGHT/LEFT  HEART CATH AND CORONARY ANGIOGRAPHY;  Surgeon: Jolaine Artist, MD;  Location: [place] [facility];  Service: Cardiovascular;  Laterality: N/A;  ? TOTAL KNEE ARTHROPLASTY Right 06/28/2016  ? Procedure: TOTA

## 2022-03-26 NOTE — Patient Instructions (Addendum)
Please continue: ?- Novolin 70/30 16 units 30 min before breakfast and 12 units 30 min before dinner ? ?Please restart: ?- Ozempic 0.5 mg weekly ? ?Please return in 4 months with your sugar log.  ?

## 2022-03-29 ENCOUNTER — Ambulatory Visit (INDEPENDENT_AMBULATORY_CARE_PROVIDER_SITE_OTHER): Payer: Medicare Other | Admitting: *Deleted

## 2022-03-29 DIAGNOSIS — E1122 Type 2 diabetes mellitus with diabetic chronic kidney disease: Secondary | ICD-10-CM

## 2022-03-29 DIAGNOSIS — I5032 Chronic diastolic (congestive) heart failure: Secondary | ICD-10-CM

## 2022-03-29 NOTE — Patient Instructions (Signed)
Visit Information ? ?[place], thank you for taking time to talk with me today. Please don't hesitate to contact me if I can be of assistance to you before our next scheduled telephone appointment ? ?Below are the goals we discussed today:  ?Patient Self-Care Activities: ?Patient [name] will:  ?Take medications as prescribed ?Attend all scheduled provider appointments ?Call pharmacy for medication refills ?Call provider office for new concerns or questions ?Continue using home oxygen as prescribed and monitor your oxygen levels regularly at home: with activity and at rest, and if you ever start to feel unusually short of breath ?Continue to weigh daily: you reported weights at home today consistently between 165-166 lbs; You are on track with maintaining your goal weight- keep up the great work tracking weight in your health journal ?Call office if I gain more than 2 pounds in one day or 5 pounds in one week ?Watch for swelling in feet, ankles and legs every day- if you experience new/ unusual swelling that does not go away promptly, call your heart failure doctor ?Check blood sugar at prescribed times, or if I feel it is too high or too low.  Keep up entering blood sugar readings and medication or insulin into daily log ?Take the blood sugar log to all doctor visits ?Continue your excellent efforts to follow heart healthy, low salt, low sugar/ carb modified diet ?Keep up the great work staying active and walking for exercise when the weather is nice- keep up the great work ? ?Our next scheduled telephone follow up visit/ appointment is scheduled on: Monday, August 02, 2022 at 9:45 am- This is a PHONE [place] appointment ? ?If you need to cancel or re-schedule our visit, please call [phone] and our care guide team will be happy to assist you. ?  ?I look forward to hearing about your progress. ?  ?[name], RN, BSN, CCRN Alumnus ?[place] ?[phone]: direct  office ? ?If you are experiencing a Mental Health or [place] or need someone to talk to, please  ?call the Suicide and Crisis Lifeline: 988 ?call the [place] National Suicide Prevention Lifeline: 812 424 8159 or TTY: 913-267-7702 TTY 8645435616) to talk to a trained counselor ?call 1-800-273-TALK (toll free, 24 hour hotline) ?go to [facility] [address], [place] [phone]) ?call 911  ? ?The patient verbalized understanding of instructions, educational materials, and care plan provided today and agreed to receive a mailed copy of patient instructions, educational materials, and care plan ? ?Diabetes Mellitus and Standards of Medical Care ?Living with and managing diabetes (diabetes mellitus) can be complicated. Your diabetes treatment may be managed by a team of health care providers, including: ?A physician who specializes in diabetes (endocrinologist). You might also have visits with a nurse practitioner or physician assistant. ?Nurses. ?A registered dietitian. ?A certified diabetes care and education specialist. ?An exercise specialist. ?A pharmacist. ?An eye doctor. ?A foot specialist (podiatrist). ?A dental care provider. ?A primary care provider. ?A mental health care provider. ?How to manage your diabetes ?You can do many things to successfully manage your diabetes. Your health care providers will follow guidelines to help you get the best quality of care. Here are general guidelines for your diabetes management plan. Your health care providers may give you more specific instructions. ?Physical exams ?When you are diagnosed with diabetes, and each year after that, your health care provider will ask about your medical and family history. You will  have a physical exam, which may include: ?Measuring your height, weight, and body mass index (BMI). ?Checking your blood pressure. This will be done at every routine medical visit. Your target blood  pressure may vary depending on your medical conditions, your age, and other factors. ?A thyroid exam. ?A skin exam. ?Screening for nerve damage (peripheral neuropathy). This may include checking the pulse in your legs and feet and the level of sensation in your hands and feet. ?A foot exam to inspect the structure and skin of your feet, including checking for cuts, bruises, redness, blisters, sores, or other problems. ?Screening for blood vessel (vascular) problems. This may include checking the pulse in your legs and feet and checking your temperature. ?Blood tests ?Depending on your treatment plan and your personal needs, you may have the following tests: ?Hemoglobin A1C (HbA1C). This test provides information about blood sugar (glucose) control over the previous 2-3 months. It is used to adjust your treatment plan, if needed. This test will be done: ?At least 2 times a year, if you are meeting your treatment goals. ?4 times a year, if you are not meeting your treatment goals or if your goals have changed. ?Lipid testing, including total cholesterol, LDL and HDL cholesterol, and triglyceride levels. ?The goal for LDL is less than 100 mg/dL (5.5 mmol/L). If you are at high risk for complications, the goal is less than 70 mg/dL (3.9 mmol/L). ?The goal for HDL is 40 mg/dL (2.2 mmol/L) or higher for men, and 50 mg/dL (2.8 mmol/L) or higher for women. An HDL cholesterol of 60 mg/dL (3.3 mmol/L) or higher gives some protection against heart disease. ?The goal for triglycerides is less than 150 mg/dL (8.3 mmol/L). ?Liver function tests. ?Kidney function tests. ?Thyroid function tests. ? ?Dental and eye exams ? ?Visit your dentist two times a year. ?If you have type 1 diabetes, your health care provider may recommend an eye exam within 5 years after you are diagnosed, and then once a year after your first exam. ?For children with type 1 diabetes, the health care provider may recommend an eye exam when your child is age 4  or older and has had diabetes for 3-5 years. After the first exam, your child should get an eye exam once a year. ?If you have type 2 diabetes, your health care provider may recommend an eye exam as soon as you are diagnosed, and then every 1-2 years after your first exam. ?Immunizations ?A yearly flu (influenza) vaccine is recommended annually for everyone 6 months or older. This is especially important if you have diabetes. ?The pneumonia (pneumococcal) vaccine is recommended for everyone 2 years or older who has diabetes. If you are age 73 or older, you may get the pneumonia vaccine as a series of two separate shots. ?The hepatitis B vaccine is recommended for adults shortly after being diagnosed with diabetes. ?Adults and children with diabetes should receive all other vaccines according to age-specific recommendations from the Centers for Disease Control and Prevention (CDC). ?Mental and emotional health ?Screening for symptoms of eating disorders, anxiety, and depression is recommended at the time of diagnosis and after as needed. If your screening shows that you have symptoms, you may need more evaluation. You may work with a mental health care provider. ?Follow these instructions at home: ?Treatment plan ?You will monitor your blood glucose levels and may give yourself insulin. Your treatment plan will be reviewed at every medical visit. You and your health care provider will discuss: ?How you  are taking your medicines, including insulin. ?Any side effects you have. ?Your blood glucose level target goals. ?How often you monitor your blood glucose level. ?Lifestyle habits, such as activity level and tobacco, alcohol, and substance use. ?Education ?Your health care provider will assess how well you are monitoring your blood glucose levels and whether you are taking your insulin and medicines correctly. He or she may refer you to: ?A certified diabetes care and education specialist to manage your diabetes  throughout your life, starting at diagnosis. ?A registered dietitian who can create and review your personal nutrition plan. ?An exercise specialist who can discuss your activity level and exercise plan. ?G

## 2022-03-29 NOTE — Chronic Care Management (AMB) (Signed)
?Chronic Care Management  ? ?CCM RN Visit Note ? ?03/29/2022 ?Name: [name] MRN: [id number] DOB: [birth year] ? ?Subjective: ?[name] is a 88 y.o. year old female who is a primary care patient of Burns, [name], MD. The care management team was consulted for assistance with disease management and care coordination needs.   ? ?Engaged with patient by telephone for follow up visit in response to provider referral for case management and/or care coordination services.  ? ?Consent to Services:  ?The patient was given information about Chronic Care Management services, agreed to services, and gave verbal consent prior to initiation of services.  Please see initial visit note for detailed documentation.  ?Patient agreed to services and verbal consent obtained.  ? ?Assessment: Review of patient past medical history, allergies, medications, health status, including review of consultants reports, laboratory and other test data, was performed as part of comprehensive evaluation and provision of chronic care management services.  ? ?SDOH (Social Determinants of Health) assessments and interventions performed:  ?SDOH Interventions   ? ?Flowsheet Row Most Recent Value  ?SDOH Interventions   ?Food Insecurity Interventions Intervention Not Indicated  [continues to deny food insecurity]  ?Housing Interventions Intervention Not Indicated  [continues to reside in ALF]  ?Transportation Interventions Intervention Not Indicated  [daughter continues to provide transportation and patient also able to use transportation available at ALF]  ? ?  ?CCM Care Plan ? ?Allergies  ?Allergen Reactions  ? Levaquin [Levofloxacin] Shortness Of Breath and Swelling  ?  angioedema  ? Lobster [Shellfish Allergy] Other (See Comments)  ?  angioedema  ? Penicillins Rash  ?  Has patient had a PCN reaction causing immediate rash, facial/tongue/throat swelling, SOB or lightheadedness with hypotension: Yes ?Has patient had a PCN reaction causing  severe rash involving mucus membranes or skin necrosis: No ?Has patient had a PCN reaction that required hospitalization: No ?Has patient had a PCN reaction occurring within the last 10 years: No ?If all of the above answers are "NO", then may proceed with Cephalosporin use.  ? Valsartan Other (See Comments)  ?  REACTION: angioedema  ? Codeine Other (See Comments)  ?  Mental status changes  ? Lipitor [Atorvastatin] Other (See Comments)  ?  weakness  ? Oxycodone Other (See Comments)  ?  hallucinations  ? Statins   ?  Made her too weak  ? Tramadol   ? Amlodipine Besylate Other (See Comments)  ?  REACTION: tingling in lips & gum edema ?7/12: talked to patient, states she is tolerating well  ? Crestor [Rosuvastatin Calcium] Rash  ? Tramadol Hcl Nausea And Vomiting  ? ?Outpatient Encounter Medications as of 03/29/2022  ?Medication Sig  ? acetaminophen (TYLENOL) 325 MG tablet Take 2 tablets (650 mg total) by mouth every 6 (six) hours as needed for mild pain or headache.  ? amLODipine (NORVASC) 2.5 MG tablet TAKE 1 TABLET BY MOUTH  DAILY AND MAY TAKE EXTRA  2.'5MG'$  FOR SYSTOLIC BLOOD  PRESSURE OVER 180  ? carvedilol (COREG) 6.25 MG tablet TAKE 1 TABLET BY MOUTH  TWICE DAILY WITH MEALS  ? ELIQUIS 5 MG TABS tablet TAKE 1 TABLET BY MOUTH  TWICE DAILY  ? glucose blood (ONETOUCH VERIO) test strip 1 each by Other route 2 (two) times daily. And lancets 2/day  ? insulin aspart protamine- aspart (NOVOLOG MIX 70/30) (70-30) 100 UNIT/ML injection Inject 16 Units into the skin daily with breakfast. 12 units in the PM  ? Lancet Devices (LANCING DEVICE) MISC  Use as advised - for Delica Lancets  ? Lancets (ONETOUCH DELICA PLUS [id number]) MISC USE TO MONITOR GLUCOSE  LEVELS TWICE DAILY  ? Multiple Vitamins-Minerals (CENTRUM SILVER ULTRA WOMENS PO) Take 1 tablet by mouth daily.  ? nitroGLYCERIN (NITROSTAT) 0.4 MG SL tablet Place 1 tablet (0.4 mg total) under the tongue every 5 (five) minutes as needed for chest pain. Reported on 01/28/2016  ?  NOVOFINE PLUS PEN NEEDLE 32G X 4 MM MISC SMARTSIG:1 Each SUB-Q Daily  ? OXYGEN Inhale 3 Doses into the lungs as needed. 3 liters  ? potassium chloride SA (KLOR-CON) 20 MEQ tablet TAKE 2 TABLETS BY MOUTH  DAILY  ? Semaglutide,0.25 or 0.'5MG'$ /DOS, (OZEMPIC, 0.25 OR 0.5 MG/DOSE,) 2 MG/1.5ML SOPN Inject 0.5 mg into the skin once a week.  ? torsemide (DEMADEX) 20 MG tablet TAKE 2 TABLETS BY MOUTH  TWICE DAILY  ? ?No facility-administered encounter medications on file as of 03/29/2022.  ? ?Patient Active Problem List  ? Diagnosis Date Noted  ? Chronic respiratory failure with hypoxia ([place]) 05/12/2021  ? Acute respiratory failure with hypoxia and hypercapnia ([place]) 01/31/2021  ? Elevated troponin 01/31/2021  ? Poorly controlled type 2 diabetes mellitus with circulatory disorder ([place]) 01/31/2021  ? Neuropathy 08/19/2020  ? PAD (peripheral artery disease) ([place]) 02/29/2020  ? Chronic kidney disease (CKD) 02/20/2020  ? Osteoarthritis 08/01/2019  ? Arthritis of left sacroiliac joint 05/04/2017  ? Osteopenia 02/17/2017  ? Piriformis syndrome of left side 02/16/2017  ? Acquired leg length discrepancy 02/16/2017  ? COPD GOLD I  01/27/2017  ? Lumbar radiculopathy, acute 01/27/2017  ? Granuloma annulare 07/27/2016  ? Numbness of fingers 07/27/2016  ? Primary osteoarthritis of right knee 06/27/2016  ? Rash and nonspecific skin eruption 06/09/2016  ? Diabetes ([place]) 03/07/2016  ? Acute on chronic combined systolic (congestive) and diastolic (congestive) heart failure ([place])   ? OSA  08/28/2014  ? Chronic diastolic heart failure ([place]) 07/30/2014  ? Nocturnal hypoxemia 07/09/2014  ? SOB (shortness of breath) 05/15/2014  ? Pulmonary HTN ([place]) 05/15/2014  ? Band keratopathy 01/16/2014  ? 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 Hyperthyroidism 11/04/2011  ? Multinodular goiter 10/14/2011  ? Atrophy, Fuchs' 09/29/2011  ? ATRIAL FIBRILLATION 06/03/2010  ? VITILIGO 11/07/2009  ? SINOATRIAL NODE DYSFUNCTION 06/16/2009  ? Atrial flutter ([place])  06/25/2008  ? HYPERLIPIDEMIA 01/29/2008  ? Coronary atherosclerosis 01/29/2008  ? HTN (hypertension) 10/30/2007  ? ?Conditions to be addressed/monitored:  CHF and DMII ? ?Care Plan : RN Care Manager Plan of Care  ?Updates made by Knox Royalty, RN since 03/29/2022 12:00 AM  ?  ? ?Problem: Chronic Disease Management Needs   ?Priority: High  ?  ? ?Long-Range Goal: Ongoing adherence to established plan of care for long term chronic disease management   ?Start Date: 10/27/2021  ?Expected End Date: 10/27/2022  ?Recent Progress: On track  ?Priority: High  ?Note:   ?Current Barriers:  ?Chronic Disease Management support and education needs related to CHF and DMII- patient appreciaitive of ongoing support and reinforcement ?Chef prepared meals at ALF- difficult to follow prescribed diet ?03/29/22: Patient's son is undergoing new treatment regimen for cancer recurrence- this is upsetting patient and causing her stress and worry ? ?RNCM Clinical Goal(s):  ?Patient will demonstrate Ongoing health management independence DMII/ CHF  through collaboration with RN Care manager, provider, and care team.  ? ?Interventions: ?1:1 collaboration with primary care provider regarding development and update of comprehensive plan of care as evidenced by provider attestation and  co-signature ?Inter-disciplinary care team collaboration (see longitudinal plan of care) ?Evaluation of current treatment plan related to  self management and patient's adherence to plan as established by provider ?Review of patient status, including review of consultants reports, relevant laboratory and other test results, and medications completed ?SDOH updated: no new/ unmet concerns identified ?Depression screening updated: patient denies concerns around depression but acknowledges ongoing stress with son's recent medical issues ?Pain assessment updated: reports occasional "aches and pain;" denies pain today ?Falls assessment updated: continues to deny new/ recent  falls x 12 months- continues using walker;  positive reinforcement provided with encouragement to continue efforts at fall prevention; previously provided education around fall risks/ prevention reinforced ?M

## 2022-04-13 ENCOUNTER — Encounter: Payer: Self-pay | Admitting: Internal Medicine

## 2022-04-14 NOTE — Progress Notes (Signed)
? ? ?Subjective:  ? ? Patient ID: [name], female    DOB: September 26, 1931, 88 y.o.   MRN: [id number] ? ?This visit occurred during the SARS-CoV-2 public health emergency.  Safety protocols were in place, including screening questions prior to the visit, additional usage of staff PPE, and extensive cleaning of exam room while observing appropriate contact time as indicated for disinfecting solutions. ? ? ? ?HPI ?[name] is here for  ?Chief Complaint  ?Patient presents with  ? Eye Drainage  ?  Right eye mostly (drainage noted in the morning)  ? ? ?It started Monday night - it was scartachy, had a gob of mucus come out of it.  It was beet red, swollen, itchy, sore.  The right eye is a little better and now the left eye is starting.  ? ? ? ? ?Medications and allergies reviewed with patient and updated if appropriate. ? ?Current Outpatient Medications on File Prior to Visit  ?Medication Sig Dispense Refill  ? acetaminophen (TYLENOL) 325 MG tablet Take 2 tablets (650 mg total) by mouth every 6 (six) hours as needed for mild pain or headache. 20 tablet 0  ? amLODipine (NORVASC) 2.5 MG tablet TAKE 1 TABLET BY MOUTH  DAILY AND MAY TAKE EXTRA  2.'5MG'$  FOR SYSTOLIC BLOOD  PRESSURE OVER 180 180 tablet 3  ? carvedilol (COREG) 6.25 MG tablet TAKE 1 TABLET BY MOUTH  TWICE DAILY WITH MEALS 180 tablet 3  ? ELIQUIS 5 MG TABS tablet TAKE 1 TABLET BY MOUTH  TWICE DAILY 60 tablet 11  ? glucose blood (ONETOUCH VERIO) test strip 1 each by Other route 2 (two) times daily. And lancets 2/day 200 strip 3  ? insulin aspart protamine- aspart (NOVOLOG MIX 70/30) (70-30) 100 UNIT/ML injection Inject 16 Units into the skin daily with breakfast. 12 units in the PM    ? Lancet Devices (LANCING DEVICE) MISC Use as advised - for Delica Lancets 1 each 0  ? Lancets (ONETOUCH DELICA PLUS [id number]) MISC USE TO MONITOR GLUCOSE  LEVELS TWICE DAILY 200 each 3  ? Multiple Vitamins-Minerals (CENTRUM SILVER ULTRA WOMENS PO) Take 1 tablet by mouth daily.     ? nitroGLYCERIN (NITROSTAT) 0.4 MG SL tablet Place 1 tablet (0.4 mg total) under the tongue every 5 (five) minutes as needed for chest pain. Reported on 01/28/2016 30 tablet 3  ? NOVOFINE PLUS PEN NEEDLE 32G X 4 MM MISC SMARTSIG:1 Each SUB-Q Daily 200 each 1  ? OXYGEN Inhale 3 Doses into the lungs as needed. 3 liters    ? potassium chloride SA (KLOR-CON) 20 MEQ tablet TAKE 2 TABLETS BY MOUTH  DAILY 180 tablet 3  ? Semaglutide,0.25 or 0.'5MG'$ /DOS, (OZEMPIC, 0.25 OR 0.5 MG/DOSE,) 2 MG/1.5ML SOPN Inject 0.5 mg into the skin once a week. 4.5 mL 3  ? torsemide (DEMADEX) 20 MG tablet TAKE 2 TABLETS BY MOUTH  TWICE DAILY 120 tablet 11  ? ?No current facility-administered medications on file prior to visit.  ? ? ?Review of Systems  ?Constitutional:  Negative for unexpected weight change.  ?HENT:  Positive for postnasal drip. Negative for congestion, ear pain, sinus pain and sore throat.   ?Eyes:  Positive for photophobia, pain (more soreness), discharge, redness and itching. Negative for visual disturbance.  ?Respiratory:  Positive for shortness of breath (chronic - no change). Negative for cough.   ?Neurological:  Negative for headaches.  ? ?   ?Objective:  ? ?Vitals:  ? 04/15/22 0843  ?BP: 136/72  ?Pulse: 68  ?  Temp: 98 ?F (36.7 ?C)  ?SpO2: 92%  ? ?BP Readings from Last 3 Encounters:  ?04/15/22 136/72  ?03/26/22 130/80  ?12/25/21 130/68  ? ?Wt Readings from Last 3 Encounters:  ?04/15/22 161 lb (73 kg)  ?03/26/22 166 lb 12.8 oz (75.7 kg)  ?12/25/21 165 lb 3.2 oz (74.9 kg)  ? ?Body mass index is 28.52 kg/m?. ? ?  ?Physical Exam ?Constitutional:   ?   General: She is not in acute distress. ?   Appearance: Normal appearance.  ?HENT:  ?   Head: Normocephalic.  ?Eyes:  ?   General: No scleral icterus.    ?   Right eye: No discharge (no active discharge).     ?   Left eye: No discharge.  ?   Pupils: Pupils are equal, round, and reactive to light.  ?   Comments: Mild erythema right conjnuctivae - worse on lateral aspect, minimal  erythema in left   ?Musculoskeletal:  ?   Cervical back: Neck supple. No tenderness.  ?Lymphadenopathy:  ?   Cervical: No cervical adenopathy.  ?Skin: ?   General: Skin is warm and dry.  ?Neurological:  ?   Mental Status: She is alert.  ? ?   ? ? ? ? ? ?Assessment & Plan:  ? ? ?See Problem List for Assessment and Plan of chronic medical problems.  ? ? ? ? ?

## 2022-04-15 ENCOUNTER — Encounter: Payer: Self-pay | Admitting: Internal Medicine

## 2022-04-15 ENCOUNTER — Ambulatory Visit: Payer: Medicare Other | Admitting: Internal Medicine

## 2022-04-15 VITALS — BP 136/72 | HR 68 | Temp 98.0°F | Ht 63.0 in | Wt 161.0 lb

## 2022-04-15 DIAGNOSIS — H1033 Unspecified acute conjunctivitis, bilateral: Secondary | ICD-10-CM | POA: Diagnosis not present

## 2022-04-15 MED ORDER — POLYMYXIN B-TRIMETHOPRIM 10000-0.1 UNIT/ML-% OP SOLN: 1.0000 [drp] | OPHTHALMIC | 0 refills | Status: DC

## 2022-04-15 NOTE — Assessment & Plan Note (Signed)
Acute ?Right eye > left eye ?Given symptoms concern for bacterial cause ?polytrim eye drop Q 4 hrs x 7 days ?

## 2022-04-15 NOTE — Patient Instructions (Addendum)
? ? ? ? ?  Medications changes include :   use the eye drops ? ? ?Your prescription(s) have been sent to your pharmacy.  ? ? ? ? ?Return for Physical Exam. ? ?

## 2022-04-25 ENCOUNTER — Encounter: Payer: Self-pay | Admitting: Internal Medicine

## 2022-04-25 DIAGNOSIS — I5032 Chronic diastolic (congestive) heart failure: Secondary | ICD-10-CM | POA: Diagnosis not present

## 2022-04-25 DIAGNOSIS — N1831 Chronic kidney disease, stage 3a: Secondary | ICD-10-CM

## 2022-04-25 DIAGNOSIS — Z794 Long term (current) use of insulin: Secondary | ICD-10-CM | POA: Diagnosis not present

## 2022-04-25 DIAGNOSIS — E1122 Type 2 diabetes mellitus with diabetic chronic kidney disease: Secondary | ICD-10-CM

## 2022-04-26 MED ORDER — AZITHROMYCIN 500 MG PO TABS: 500.0000 mg | ORAL_TABLET | Freq: Once | ORAL | 0 refills | Status: DC

## 2022-05-02 ENCOUNTER — Encounter (HOSPITAL_COMMUNITY): Payer: Self-pay | Admitting: Internal Medicine

## 2022-05-07 ENCOUNTER — Encounter (HOSPITAL_COMMUNITY): Payer: Self-pay | Admitting: Internal Medicine

## 2022-05-11 ENCOUNTER — Other Ambulatory Visit (HOSPITAL_COMMUNITY): Payer: Self-pay | Admitting: Internal Medicine

## 2022-05-11 ENCOUNTER — Other Ambulatory Visit: Payer: Self-pay | Admitting: Endocrinology

## 2022-05-11 DIAGNOSIS — N183 Type 2 diabetes mellitus with diabetic chronic kidney disease: Secondary | ICD-10-CM

## 2022-05-17 ENCOUNTER — Encounter: Payer: Self-pay | Admitting: Internal Medicine

## 2022-05-18 NOTE — Telephone Encounter (Signed)
Granddaughter drop

## 2022-05-18 NOTE — Telephone Encounter (Signed)
Granddaughter dropped off patient assistance paperwork - placed in provider box at front desk

## 2022-05-31 ENCOUNTER — Encounter: Payer: Self-pay | Admitting: Internal Medicine

## 2022-06-03 ENCOUNTER — Telehealth (HOSPITAL_COMMUNITY): Payer: Self-pay | Admitting: *Deleted

## 2022-06-03 MED ORDER — TORSEMIDE 20 MG PO TABS: 40.0000 mg | ORAL_TABLET | Freq: Two times a day (BID) | ORAL | 0 refills | Status: DC

## 2022-06-03 NOTE — Telephone Encounter (Signed)
Pt called to report she is out of Torsemide and ordered her refill 2 wks ago but [facility] said she didn't so she can not get it from them until Saturday. She request temp supply be sent to [facility], rx for 20 tabs sent in, she is thankful.

## 2022-06-14 ENCOUNTER — Encounter: Payer: Self-pay | Admitting: Pulmonary Disease

## 2022-06-14 ENCOUNTER — Ambulatory Visit (INDEPENDENT_AMBULATORY_CARE_PROVIDER_SITE_OTHER): Payer: Medicare Other | Admitting: Pulmonary Disease

## 2022-06-14 VITALS — BP 154/64 | HR 72 | Temp 98.0°F | Ht 63.0 in | Wt 162.8 lb

## 2022-06-14 DIAGNOSIS — J432 Centrilobular emphysema: Secondary | ICD-10-CM

## 2022-06-14 DIAGNOSIS — I272 Pulmonary hypertension, unspecified: Secondary | ICD-10-CM | POA: Diagnosis not present

## 2022-06-14 DIAGNOSIS — J9611 Chronic respiratory failure with hypoxia: Secondary | ICD-10-CM

## 2022-06-14 MED ORDER — AZELASTINE HCL 0.15 % NA SOLN: 1.0000 | Freq: Every day | NASAL | Status: DC | PRN

## 2022-06-14 MED ORDER — FLUTICASONE PROPIONATE 50 MCG/ACT NA SUSP: 1.0000 | Freq: Every day | NASAL | 2 refills | Status: DC | PRN

## 2022-06-14 NOTE — Progress Notes (Signed)
[facility]  Chief Complaint  Patient presents with   Follow-up    Follow up. Patient has no complaints.     Past Surgical History:  She  has a past surgical history that includes Cholecystectomy; Knee cartilage surgery (Right); Eye surgery; A flutter ablation; gravid 2 para 2; Colonoscopy (6/07); Coronary angioplasty with stent (02/2007); colonoscopy with polypectomy; left and right heart catheterization with coronary angiogram (N/A, 05/07/2014); Cataract extraction w/ intraocular lens  implant, bilateral (Bilateral); Corneal transplant (Bilateral); Total knee arthroplasty (Right, 06/28/2016); ABDOMINAL AORTOGRAM W/LOWER EXTREMITY (Bilateral, 04/11/2020); PERIPHERAL VASCULAR INTERVENTION (04/11/2020); RIGHT/LEFT HEART CATH AND CORONARY ANGIOGRAPHY (N/A, 05/16/2020); CORONARY STENT INTERVENTION (N/A, 05/16/2020); Intravascular Ultrasound/IVUS (N/A, 05/16/2020); CORONARY ATHERECTOMY (N/A, 05/16/2020); CORONARY BALLOON ANGIOPLASTY (N/A, 05/16/2020); and RIGHT HEART CATH (N/A, 02/04/2021).  Past Medical History:  A fib, CAD, HTN, HLD, Chronic diastolic dysfx, COPD, DM, Allergies  Constitutional:  BP (!) 154/64 (BP Location: Right Arm, Patient Position: Sitting, Cuff Size: Normal)   Pulse 72   Temp 98 F (36.7 C) (Oral)   Ht '5\' 3"'$  (1.6 m)   Wt 162 lb 12.8 oz (73.8 kg)   SpO2 92%   BMI 28.84 kg/m   Brief Summary:  [name] is a 88 y.o. female former smoker with obstructive sleep apnea and COPD with emphysema.      Subjective:   She is here with her daughter.  She had procedure on her jaw.  She wasn't able to use CPAP.  She felt her sleep was better without this.  She hasn't started CPAP again.  She has been using 2 liters oxygen at night and as needed when she is short of breath during the day.  She has sinus congestion with post nasal drip and cough.  Not having chest congestion, or wheeze.  SpO2 dropped to 87% on room air while walking in  office today.  She was recovered on 2 liters to above 92%.  Physical Exam:   Appearance - well kempt   ENMT - no sinus tenderness, no oral exudate, no LAN, Mallampati 2 airway, no stridor  Respiratory - equal breath sounds bilaterally, no wheezing or rales  CV - s1s2 regular rate and rhythm, no murmurs  Ext - no clubbing, no edema  Skin - no rashes  Psych - normal mood and affect    Pulmonary testing:  PFT 05/21/14 >> FEV1 1.37 (94%), FEV1% 66, TLC 4.69 (95%), DLCO 39%  Chest Imaging:  CT chest 05/24/15 >> mild centrilobular emphysema CT chest 05/25/17 >> moderate centrilobular emphysema, atherosclerosis CT angio chest 01/31/21 >> atherosclerosis, small HH, upper lobe emphysema  Sleep Tests:  PSG 7/14//15 >> AHI 37 HST 02/21/17 >> AHI 30.5, SaO2 low 78% Auto CPAP 10/23/17 to 11/21/17 >> used on 30 of 30 nights with average 6 hrs 34 min.  Average AHI 1 with median CPAP 8 and 95 th percentile CPAP 11 cm H2O HST 03/14/20 >> AHI 15.3, SpO low 79% Auto CPAP 09/07/21 to 10/06/21 >> used on 30 of 30 nights with average 4 hrs 48 min.  Average AHI 3.7 with median CPAP 8 and 95 th percentile CPAP 12 cm H2O  Cardiac Tests:  Echo 01/31/21 >> EF 40 to 45%, mod LVH, mod LA dilation, mild MR RHC 02/04/21 >> RA 6, RV 88/5, PA 86/33 (53), CI 1.9, PVR 9.4 WU  Social History:  She  reports that she quit smoking about 53 years ago. Her smoking use included cigarettes. She has a  4.50 pack-year smoking history. She has never used smokeless tobacco. She reports current alcohol use. She reports that she does not use drugs.  Family History:  Her family history includes Arthritis in her father, maternal aunt, and mother; Breast cancer in her maternal aunt; Diabetes in her mother; Heart attack (age of onset: 42) in her father; Hypertension in her father and mother; Transient ischemic attack in her mother.     Assessment/Plan:   Obstructive sleep apnea. - she is intolerant of CPAP   COPD with  emphysema. - minimal symptoms - she doesn't feel like she needs inhaler therapy at present  Chronic respiratory failure with hypoxia. - discussed importance of using supplemental oxygen as prescribed - goal SpO2 > 90% - she uses 2 liters with exertion and sleep - will she if [employer] can arrange for a POC - will arrange for ONO with 2 liters and determine if she needs to reconsider using CPAP therapy  Allergic rhinitis. - she can use OTC flonase and/or astepro prn  Chronic diastolic CHF, Coronary artery disease s/p stent, Paroxysmal atrial fib/flutter. - followed by Dr. Glori Bickers with cardiology  Goals of care. - DNR/DNI  Time Spent Involved in Patient Care on Day of Examination:  38 minutes  Follow up:   Patient Instructions  You can try flonase and/or astepro as needed to help with sinus congestion  Will arrange for overnight oxygen test on 2 liters  Will see if [employer] can arrange for a portable oxygen concentrator  Follow up in 1 year  Medication List:   Allergies as of 06/14/2022       Reactions   Levaquin [levofloxacin] Shortness Of Breath, Swelling   angioedema   Lobster [shellfish Allergy] Other (See Comments)   angioedema   Penicillins Rash   Has patient had a PCN reaction causing immediate rash, facial/tongue/throat swelling, SOB or lightheadedness with hypotension: Yes Has patient had a PCN reaction causing severe rash involving mucus membranes or skin necrosis: No Has patient had a PCN reaction that required hospitalization: No Has patient had a PCN reaction occurring within the last 10 years: No If all of the above answers are "NO", then may proceed with Cephalosporin use.   Valsartan Other (See Comments)   REACTION: angioedema   Codeine Other (See Comments)   Mental status changes   Lipitor [atorvastatin] Other (See Comments)   weakness   Oxycodone Other (See Comments)   hallucinations   Statins    Made her too weak   Tramadol    Amlodipine  Besylate Other (See Comments)   REACTION: tingling in lips & gum edema 7/12: talked to patient, states she is tolerating well   Crestor [rosuvastatin Calcium] Rash   Tramadol Hcl Nausea And Vomiting        Medication List        Accurate as of June 14, 2022  3:53 PM. If you have any questions, ask your nurse or doctor.          acetaminophen 325 MG tablet Commonly known as: TYLENOL Take 2 tablets (650 mg total) by mouth every 6 (six) hours as needed for mild pain or headache.   amLODipine 2.5 MG tablet Commonly known as: NORVASC TAKE 1 TABLET BY MOUTH  DAILY AND MAY TAKE EXTRA  2.'5MG'$  FOR SYSTOLIC BLOOD  PRESSURE OVER 180   Azelastine HCl 0.15 % Soln Commonly known as: Astepro Place 1 spray into the nose daily as needed (Allergies). Started by: Chesley Mires, MD  carvedilol 6.25 MG tablet Commonly known as: COREG TAKE 1 TABLET BY MOUTH  TWICE DAILY WITH MEALS   CENTRUM SILVER ULTRA WOMENS PO Take 1 tablet by mouth daily.   Eliquis 5 MG Tabs tablet Generic drug: apixaban TAKE 1 TABLET BY MOUTH  TWICE DAILY   fluticasone 50 MCG/ACT nasal spray Commonly known as: FLONASE Place 1 spray into both nostrils daily as needed for allergies or rhinitis. Started by: Chesley Mires, MD   insulin aspart protamine- aspart (70-30) 100 UNIT/ML injection Commonly known as: NOVOLOG MIX 70/30 Inject 16 Units into the skin daily with breakfast. 12 units in the PM   Lancing Device Misc Use as advised - for Delica Lancets   nitroGLYCERIN 0.4 MG SL tablet Commonly known as: NITROSTAT Place 1 tablet (0.4 mg total) under the tongue every 5 (five) minutes as needed for chest pain. Reported on 01/28/2016   NovoFine Plus Pen Needle 32G X 4 MM Misc Generic drug: Insulin Pen Needle SMARTSIG:1 Each SUB-Q Daily   OneTouch Delica Plus [id number] Misc USE TO MONITOR GLUCOSE  LEVELS TWICE DAILY   OneTouch Verio test strip Generic drug: glucose blood CHECK BLOOD SUGAR TWICE  DAILY    OXYGEN Inhale 3 Doses into the lungs as needed. 3 liters   Ozempic (0.25 or 0.5 MG/DOSE) 2 MG/1.5ML Sopn Generic drug: Semaglutide(0.25 or 0.'5MG'$ /DOS) Inject 0.5 mg into the skin once a week.   potassium chloride SA 20 MEQ tablet Commonly known as: KLOR-CON M TAKE 2 TABLETS BY MOUTH  DAILY   torsemide 20 MG tablet Commonly known as: DEMADEX Take 2 tablets (40 mg total) by mouth 2 (two) times daily.   trimethoprim-polymyxin b ophthalmic solution Commonly known as: POLYTRIM Place 1 drop into both eyes every 4 (four) hours. Use for 7 days        Signature:  Chesley Mires, MD [place] Pager - [phone] 06/14/2022, 3:53 PM

## 2022-06-14 NOTE — Patient Instructions (Signed)
You can try flonase and/or astepro as needed to help with sinus congestion  Will arrange for overnight oxygen test on 2 liters  Will see if [employer] can arrange for a portable oxygen concentrator  Follow up in 1 year

## 2022-06-30 ENCOUNTER — Telehealth: Payer: Self-pay | Admitting: Pulmonary Disease

## 2022-06-30 NOTE — Telephone Encounter (Signed)
Called and went over results with patient and she svoiced understanding. She states she would like confirmation that she does not need to continue cpap since she is intolerant of it and just wear O2 at night.   Last ov note says: " will arrange for ONO with 2 liters and determine if she needs to reconsider using CPAP therapy"  Dr. Halford Chessman please advise, patient wants confirmation from you that she no longer has to attempt wearing CPAP if shes using O2 at night.

## 2022-06-30 NOTE — Telephone Encounter (Signed)
ONO with 2 liters 06/24/22 >> test time 7 hrs 25 min.  Baseline SpO2 93%, low SpO2 74%.  Spent 5 min 30 with SpO2 < 88%.   Please let her know her oxygen test looked okay.  She should continue using 2 liters oxygen at night.

## 2022-06-30 NOTE — Telephone Encounter (Signed)
Called and notified patient and she voiced understanding. Nothing further is needed at this time.

## 2022-06-30 NOTE — Telephone Encounter (Signed)
I confirm that she doesn't need CPAP.  She just needs to use 2 liters oxygen at night.

## 2022-07-07 ENCOUNTER — Encounter: Payer: Self-pay | Admitting: Internal Medicine

## 2022-07-28 ENCOUNTER — Telehealth: Payer: Self-pay

## 2022-07-28 NOTE — Telephone Encounter (Signed)
Pt contacted and advised patient assistance Ozempic (5 boxes) was delivered and ready for pick up.

## 2022-07-29 ENCOUNTER — Ambulatory Visit: Payer: Medicare Other | Admitting: Internal Medicine

## 2022-07-29 ENCOUNTER — Encounter: Payer: Self-pay | Admitting: Internal Medicine

## 2022-07-29 VITALS — BP 130/74 | HR 87 | Ht 63.0 in | Wt 158.0 lb

## 2022-07-29 DIAGNOSIS — E1122 Type 2 diabetes mellitus with diabetic chronic kidney disease: Secondary | ICD-10-CM

## 2022-07-29 DIAGNOSIS — E782 Mixed hyperlipidemia: Secondary | ICD-10-CM

## 2022-07-29 DIAGNOSIS — Z794 Long term (current) use of insulin: Secondary | ICD-10-CM | POA: Diagnosis not present

## 2022-07-29 DIAGNOSIS — E669 Obesity, unspecified: Secondary | ICD-10-CM

## 2022-07-29 DIAGNOSIS — N1831 Chronic kidney disease, stage 3a: Secondary | ICD-10-CM

## 2022-07-29 LAB — POCT GLYCOSYLATED HEMOGLOBIN (HGB A1C): Hemoglobin A1C: 7 % — AB (ref 4.0–5.6)

## 2022-07-29 NOTE — Patient Instructions (Addendum)
Please decrease: - Novolin 70/30 14 units 30 min before breakfast and 10-12 units 30 min before dinner  Please continue: - Ozempic 0.5 mg weekly  Please return in 4 months with your sugar log.

## 2022-07-29 NOTE — Progress Notes (Signed)
Patient ID: [name], female   DOB: [birth year], 88 y.o.   MRN: [id number]  HPI: [name] is a 88 y.o.-year-old female, self-referred (her daughter is also my patient), for management of DM2, dx in 1994, insulin-dependent, uncontrolled, with complications (CAD, s/p NSTEMI 2008; CHF; PAD; atrial fibrillation/atrial flutter; peripheral neuropathy; CKD stage IIIa).   Last visit with me 4 months ago.  Patient's daughter accompanies her today and offers information about patient's diet, activity, and insulin doses.  Interim history: No increased urination, blurry vision, nausea, chest pain.  She continues to have fatigue. At last visit, she fell of the wagon with diet, blood sugar checks and medication doses as she is very stressed with her son having recurrent cancer.  At this visit, she tells me that she is not eating too much as she is not hungry.  Reviewed HbA1c: Lab Results  Component Value Date   HGBA1C 8.4 (A) 03/26/2022   HGBA1C 7.3 (A) 12/25/2021   HGBA1C 9.0 (A) 09/14/2021   HGBA1C 8.3 (A) 05/28/2021   HGBA1C 8.0 (A) 03/24/2021   HGBA1C 9.5 (A) 12/02/2020   HGBA1C 7.8 (A) 08/21/2020   HGBA1C 8.3 (H) 05/15/2020   HGBA1C 8.0 (A) 05/06/2020   HGBA1C 9.2 (H) 02/20/2020   HGBA1C 7.9 (A) 11/14/2019   HGBA1C 7.6 (A) 08/22/2019   HGBA1C 8.1 (A) 05/01/2019   HGBA1C 8.9 (H) 01/31/2019   HGBA1C 7.0 (A) 12/06/2018   HGBA1C 6.5 (A) 09/20/2018   HGBA1C 6.4 (A) 07/19/2018   HGBA1C 7.3 05/08/2018   HGBA1C 8.0 (H) 01/30/2018   HGBA1C 8.3 11/23/2017   HGBA1C 7.4 08/23/2017   HGBA1C 8.2 01/24/2017   HGBA1C 7.7 09/08/2016   HGBA1C 8.4 (H) 06/09/2016   HGBA1C 7.8 (H) 03/04/2016   HGBA1C 7.6 12/08/2015   HGBA1C 8.3 08/26/2015   HGBA1C 7.9 (H) 05/24/2015   HGBA1C 8.6 (H) 02/06/2015   HGBA1C 8.7 (H) 11/15/2014   HGBA1C 8.1 (H) 08/21/2014   HGBA1C 9.6 (H) 01/21/2014   HGBA1C 9.8 (H) 09/13/2013   HGBA1C 9.7 (H) 05/15/2013   HGBA1C 10.9 (H) 02/01/2013   HGBA1C 8.9 (H)  09/26/2012   HGBA1C 9.9 (H) 05/29/2012   HGBA1C 8.1 (H) 09/06/2011   HGBA1C 8.3 (H) 07/13/2011   HGBA1C 6.8 (H) 06/15/2010   Pt is on a regimen of: - Humalog 75/25 >> 70/30 Novolin (24$ at [place], w/o Rx) 32 units before breakfast and 6 units before dinner >> 28-30 units before breakfast >> 12 units 2x a day before meals >> 16 units in the a.m. and 12 units before dinner - Ozempic 0.25 >> 0.5 mg weekly-added 08/2021 >> stopped 12/2021 2/2 $$ but now new insurance >> restarted (patient assistance)  Pt checks her sugars 2-4x a day and they are: - am: 108-151, 165 >> 104-184, 215 >> 89-158, 164, 186 - 2h after b'fast: n/c >> 154 >> 172 >> n/c - before lunch: 229, 362 >> 125-194, 220 >> 248 >> 189 >> 79 - 2h after lunch: n/c  >> 148 - before dinner:82-150, 182, 258 >> 129, 144 >> 78-118, 178. 199  - 2h after dinner: 134, 223-230 >> 70, 159-187 >> 420 >> 171, 198 - bedtime: 69-260 >> 115-196, 233 >> 79, 110-149, 239 >> 61, 100, 181 - nighttime: n/c >> 93-227 >> 186-286 >>  Lowest sugar was 64 >> 51 >> 104>> 59 ; she has hypoglycemia awareness at 70.  Highest sugar was 270 >> 260 >> 420.  Glucometer: One Probation officer IQ  Pt's  meals are: - Breakfast: frozen banana, berries, oatmeal, protein powder >> yogurt and fruit - dinner: Includes soup  But she has lunch and dinner in the cafeteria ([facility] facility: [place]):   -+ History of CKD stage IIIa, last BUN/creatinine:  Lab Results  Component Value Date   BUN 31 (H) 12/17/2021   BUN 16 02/05/2021   CREATININE 1.24 (H) 12/17/2021   CREATININE 0.84 02/05/2021  She is not on an ACE inhibitor/ARB  -+ HL; last set of lipids: Lab Results  Component Value Date   CHOL 164 05/16/2020   HDL 50 05/16/2020   LDLCALC 96 05/16/2020   LDLDIRECT 144.6 02/03/2010   TRIG 90 05/16/2020   CHOLHDL 3.3 05/16/2020  She had muscle weakness from statins.   - last eye exam was on 11/16/2021: No DR  - no numbness and  tingling in her feet.  She saw podiatry, but not recently.  Pt has FH of DM in mother.  She also has a history of HTN, osteoporosis, protein-calorie malnutrition, iron deficiency anemia.  No FH of MTC, no pancreatitis hx.  ROS: + See HPI  Past Medical History:  Diagnosis Date   Anemia    iron defficiency   Anginal pain ([place])    Arthritis    "knees, feet, hands; joints" (05/27/2015)   Asthma    Atrial fibrillation or flutter    s/p RFCA 7/08;   s/p DCCV in past;   previously on amiodarone;  amio stopped due to lung toxicity   CAD (coronary artery disease)    s/p NSTEMI tx with BMS to OM1 3/08;  cath 3/08: pOM 99% tx with PCI, pLAD 20%, ? mod stenosis at the AM   CAP (community acquired pneumonia) 05/24/2015   CHF (congestive heart failure) ([place])    Chronic diastolic heart failure ([place])    echo 11/11:  EF 55-60%, severe LVH, mod LAE, mild MR, mildly increased PASP   COPD (chronic obstructive pulmonary disease) ([facility])    Degenerative joint disease    Dystrophy, corneal stromal    Heart murmur    HLD (hyperlipidemia)    HTN (hypertension)    essential nos   Hypopotassemia    PMH of   Muscle pain    Myocardial infarction ([place]) 2008   OSA on CPAP    Osteoporosis    Pneumonia 05/27/2015   Protein calorie malnutrition ([facility])    Rash and nonspecific skin eruption    both arms,awaiting bio   dr Delman Cheadle   Seasonal allergies    Shortness of breath dyspnea    Type II diabetes mellitus ([place])    Dr Loanne Drilling   Past Surgical History:  Procedure Laterality Date   A FLUTTER ABLATION     Dr Lovena Le   ABDOMINAL AORTOGRAM W/LOWER EXTREMITY Bilateral 04/11/2020   Procedure: ABDOMINAL AORTOGRAM W/LOWER EXTREMITY;  Surgeon: Angelia Mould, MD;  Location: [facility];  Service: Cardiovascular;  Laterality: Bilateral;   CATARACT EXTRACTION W/ INTRAOCULAR LENS  IMPLANT, BILATERAL Bilateral    CHOLECYSTECTOMY     COLONOSCOPY  6/07   2 polyps Dr. Kinnie Feil in [place]   colonoscopy with  polypectomy      X 2; [facility]   CORNEAL TRANSPLANT Bilateral    CORONARY ANGIOPLASTY WITH STENT PLACEMENT  02/2007   BMS w/Dr Lovena Le   CORONARY ATHERECTOMY N/A 05/16/2020   Procedure: CORONARY ATHERECTOMY;  Surgeon: Martinique, Peter M, MD;  Location: [facility];  Service: Cardiovascular;  Laterality:  N/A;   CORONARY BALLOON ANGIOPLASTY N/A 05/16/2020   Procedure: CORONARY BALLOON ANGIOPLASTY;  Surgeon: Martinique, Peter M, MD;  Location: [facility];  Service: Cardiovascular;  Laterality: N/A;   CORONARY STENT INTERVENTION N/A 05/16/2020   Procedure: CORONARY STENT INTERVENTION;  Surgeon: Martinique, Peter M, MD;  Location: [facility];  Service: Cardiovascular;  Laterality: N/A;   EYE SURGERY     gravid 2 para 2     INTRAVASCULAR ULTRASOUND/IVUS N/A 05/16/2020   Procedure: Intravascular Ultrasound/IVUS;  Surgeon: Martinique, Peter M, MD;  Location: [facility];  Service: Cardiovascular;  Laterality: N/A;   KNEE CARTILAGE SURGERY Right    LEFT AND RIGHT HEART CATHETERIZATION WITH CORONARY ANGIOGRAM N/A 05/07/2014   Procedure: LEFT AND RIGHT HEART CATHETERIZATION WITH CORONARY ANGIOGRAM;  Surgeon: Peter M Martinique, MD;  Location: [facility];  Service: Cardiovascular;  Laterality: N/A;   PERIPHERAL VASCULAR INTERVENTION  04/11/2020   Procedure: PERIPHERAL VASCULAR INTERVENTION;  Surgeon: Angelia Mould, MD;  Location: [facility];  Service: Cardiovascular;;   RIGHT HEART CATH N/A 02/04/2021   Procedure: RIGHT HEART CATH;  Surgeon: Jolaine Artist, MD;  Location: [facility];  Service: Cardiovascular;  Laterality: N/A;   RIGHT/LEFT HEART CATH AND CORONARY ANGIOGRAPHY N/A 05/16/2020   Procedure: RIGHT/LEFT HEART CATH AND CORONARY ANGIOGRAPHY;  Surgeon: Jolaine Artist, MD;  Location: [facility];  Service: Cardiovascular;  Laterality: N/A;   TOTAL KNEE ARTHROPLASTY Right 06/28/2016   Procedure: TOTAL KNEE ARTHROPLASTY;  Surgeon: Frederik Pear, MD;   Location: [facility];  Service: Orthopedics;  Laterality: Right;   Social History   Socioeconomic History   Marital status: Widowed    Spouse name: Not on file   Number of children: 2   Years of education: 16   Highest education level: Bachelor's degree (e.g., BA, AB, BS)  Occupational History   Occupation: Teacher/ Retired  Tobacco Use   Smoking status: Former    Packs/day: 0.25    Years: 18.00    Total pack years: 4.50    Types: Cigarettes    Quit date: 12/27/1968    Years since quitting: 53.6   Smokeless tobacco: Never   Tobacco comments:    smoked [place], up to 1 pp week  Vaping Use   Vaping Use: Never used  Substance and Sexual Activity   Alcohol use: Yes    Comment: occ   Drug use: No   Sexual activity: Never  Other Topics Concern   Not on file  Social History Narrative   Teacher. Widowed. Rarely drinks cafeine.    Social Determinants of Health   Financial Resource Strain: Low Risk  (09/03/2021)   Overall Financial Resource Strain (CARDIA)    Difficulty of Paying Living Expenses: Not hard at all  Food Insecurity: No Food Insecurity (03/29/2022)   Hunger Vital Sign    Worried About Running Out of Food in the Last Year: Never true    Ran Out of Food in the Last Year: Never true  Transportation Needs: No Transportation Needs (03/29/2022)   PRAPARE - Hydrologist (Medical): No    Lack of Transportation (Non-Medical): No  Physical Activity: Sufficiently Active (09/03/2021)   Exercise Vital Sign    Days of Exercise per Week: 5 days    Minutes of Exercise per Session: 30 min  Stress: No Stress Concern Present (09/03/2021)   [place]  Feeling of Stress : Not at all  Social Connections: Moderately Integrated (09/03/2021)   Social Connection and Isolation Panel [NHANES]    Frequency of Communication with Friends and Family: More than three times a week    Frequency of Social  Gatherings with Friends and Family: More than three times a week    Attends Religious Services: More than 4 times per year    Active Member of [employer] or Organizations: Yes    Attends Archivist Meetings: More than 4 times per year    Marital Status: Widowed  Intimate Partner Violence: Not At Risk (09/03/2021)   Humiliation, Afraid, Rape, and Kick questionnaire    Fear of Current or Ex-Partner: No    Emotionally Abused: No    Physically Abused: No    Sexually Abused: No   Current Outpatient Medications on File Prior to Visit  Medication Sig Dispense Refill   acetaminophen (TYLENOL) 325 MG tablet Take 2 tablets (650 mg total) by mouth every 6 (six) hours as needed for mild pain or headache. 20 tablet 0   amLODipine (NORVASC) 2.5 MG tablet TAKE 1 TABLET BY MOUTH  DAILY AND MAY TAKE EXTRA  2.'5MG'$  FOR SYSTOLIC BLOOD  PRESSURE OVER 180 180 tablet 3   Azelastine HCl (ASTEPRO) 0.15 % SOLN Place 1 spray into the nose daily as needed (Allergies).     carvedilol (COREG) 6.25 MG tablet TAKE 1 TABLET BY MOUTH  TWICE DAILY WITH MEALS 180 tablet 3   ELIQUIS 5 MG TABS tablet TAKE 1 TABLET BY MOUTH  TWICE DAILY 60 tablet 11   fluticasone (FLONASE) 50 MCG/ACT nasal spray Place 1 spray into both nostrils daily as needed for allergies or rhinitis. 16 g 2   insulin aspart protamine- aspart (NOVOLOG MIX 70/30) (70-30) 100 UNIT/ML injection Inject 16 Units into the skin daily with breakfast. 12 units in the PM     Lancet Devices (LANCING DEVICE) MISC Use as advised - for Delica Lancets 1 each 0   Lancets (ONETOUCH DELICA PLUS ZOXWRU04V) MISC USE TO MONITOR GLUCOSE  LEVELS TWICE DAILY 200 each 3   Multiple Vitamins-Minerals (CENTRUM SILVER ULTRA WOMENS PO) Take 1 tablet by mouth daily.     nitroGLYCERIN (NITROSTAT) 0.4 MG SL tablet Place 1 tablet (0.4 mg total) under the tongue every 5 (five) minutes as needed for chest pain. Reported on 01/28/2016 30 tablet 3   NOVOFINE PLUS PEN NEEDLE 32G X 4 MM MISC  SMARTSIG:1 Each SUB-Q Daily 200 each 1   ONETOUCH VERIO test strip CHECK BLOOD SUGAR TWICE  DAILY 200 strip 3   OXYGEN Inhale 3 Doses into the lungs as needed. 3 liters     potassium chloride SA (KLOR-CON M) 20 MEQ tablet TAKE 2 TABLETS BY MOUTH  DAILY 180 tablet 3   Semaglutide,0.25 or 0.'5MG'$ /DOS, (OZEMPIC, 0.25 OR 0.5 MG/DOSE,) 2 MG/1.5ML SOPN Inject 0.5 mg into the skin once a week. 4.5 mL 3   torsemide (DEMADEX) 20 MG tablet Take 2 tablets (40 mg total) by mouth 2 (two) times daily. 20 tablet 0   trimethoprim-polymyxin b (POLYTRIM) ophthalmic solution Place 1 drop into both eyes every 4 (four) hours. Use for 7 days 10 mL 0   No current facility-administered medications on file prior to visit.   Allergies  Allergen Reactions   Levaquin [Levofloxacin] Shortness Of Breath and Swelling    angioedema   Lobster [Shellfish Allergy] Other (See Comments)    angioedema   Penicillins Rash    Has  patient had a PCN reaction causing immediate rash, facial/tongue/throat swelling, SOB or lightheadedness with hypotension: Yes Has patient had a PCN reaction causing severe rash involving mucus membranes or skin necrosis: No Has patient had a PCN reaction that required hospitalization: No Has patient had a PCN reaction occurring within the last 10 years: No If all of the above answers are "NO", then may proceed with Cephalosporin use.   Valsartan Other (See Comments)    REACTION: angioedema   Codeine Other (See Comments)    Mental status changes   Lipitor [Atorvastatin] Other (See Comments)    weakness   Oxycodone Other (See Comments)    hallucinations   Statins     Made her too weak   Tramadol    Amlodipine Besylate Other (See Comments)    REACTION: tingling in lips & gum edema 7/12: talked to patient, states she is tolerating well   Crestor [Rosuvastatin Calcium] Rash   Tramadol Hcl Nausea And Vomiting   Family History  Problem Relation Age of Onset   Diabetes Mother    Hypertension Mother     Transient ischemic attack Mother    Arthritis Mother    Heart attack Father 65   Arthritis Father    Hypertension Father    Breast cancer Maternal Aunt    Arthritis Maternal Aunt    PE: BP 130/74 (BP Location: Right Arm, Patient Position: Sitting, Cuff Size: Normal)   Pulse 87   Ht '5\' 3"'$  (1.6 m)   Wt 158 lb (71.7 kg)   SpO2 92%   BMI 27.99 kg/m  Wt Readings from Last 3 Encounters:  07/29/22 158 lb (71.7 kg)  06/14/22 162 lb 12.8 oz (73.8 kg)  04/15/22 161 lb (73 kg)   Constitutional: Slightly overweight, in NAD Eyes: EOMI, no exophthalmos ENT: moist mucous membranes, no thyromegaly, no cervical lymphadenopathy Cardiovascular: RRR, No MRG, B pitting LE edema - wears compression hoses Respiratory: CTA B Musculoskeletal: no deformities Skin: moist, warm, + stasis dermatitis rash - bilateral lower legs Neurological: no tremor with outstretched hands Diabetic Foot Exam - Simple   Simple Foot Form Diabetic Foot exam was performed with the following findings: Yes 07/29/2022  2:15 PM  Visual Inspection No deformities, no ulcerations, no other skin breakdown bilaterally: Yes Sensation Testing See comments: Yes Pulse Check See comments: Yes Comments Discoloration B feet. Decreased sensation B feet. Pulses not palpable.    ASSESSMENT: 1. DM2, insulin-dependent, uncontrolled, with complications - CAD, s/p NSTEMI 2008 - CHF - PAD - h/o R stent - atrial fibrillation/atrial flutter -previously on amiodarone - peripheral neuropathy - CKD stage IIIa  2. Obesity class 1  3. HL  PLAN:  1. Patient with longstanding, uncontrolled, type 2 diabetes, on a premixed insulin regimen to which we added a GLP-1 receptor agonist afterwards, with improvement in the blood sugars.  However, Ozempic became expensive and she was off the medication at last visit.  We restarted it and she finally obtained it through [employer] patient assistance program.  At that time, due to being off  Ozempic and also relaxing her diet, her sugars were higher and HbA1c increased to 8.4%. -At today's visit, sugars are mostly at goal, with occasional mild low blood sugars-she had a value of 61 at bedtime when she had a smaller dinner but she also has 2 values a 79 before lunch.  For now, I advised her to decrease the dose of premixed insulin regimen in the morning and to vary the dose  of the insulin before dinner.  Her sugars in the morning are usually at or above target so I am reticent to decrease her evening insulin dose more.  For now, we will continue Ozempic, which she tolerates well. - I suggested to:  Patient Instructions  Please decrease: - Novolin 70/30 14 units 30 min before breakfast and 10-12 units 30 min before dinner  Please continue: - Ozempic 0.5 mg weekly  Please return in 4 months with your sugar log.   - CBG targets for treatment for her are: 80-150 mg/dL before meals and <180-200 mg/dL after meals; target HbA1c <7.4% (due to age). - we checked her HbA1c: 7.0% (lower) - advised to check sugars at different times of the day - 2x a day, rotating check times - advised for yearly eye exams >> she is UTD - return to clinic in 4 months  2.  Obesity class I -At previous visits, I suggested to increase intake of fresh fruit, dried fruit, and other snacks.  I also recommended to try to switch from smoothies in the morning to oatmeal. -We discussed in the past about stopping protein shakes between meals -At last visit she was off Ozempic for 2 months and I advised her to restart this -she is now on it, tolerated well -Weight was stable at last visit, now lost 8 lbs.  3. HL -Reviewed latest lipid panel from 04/2020: Above target, the rest the fractions at goal: Lab Results  Component Value Date   CHOL 164 05/16/2020   HDL 50 05/16/2020   LDLCALC 96 05/16/2020   LDLDIRECT 144.6 02/03/2010   TRIG 90 05/16/2020   CHOLHDL 3.3 05/16/2020  -She is not on a statin due to previous  muscle weakness  Philemon Kingdom, MD PhD [facility]

## 2022-08-02 ENCOUNTER — Ambulatory Visit: Payer: Medicare Other | Admitting: *Deleted

## 2022-08-02 DIAGNOSIS — I5032 Chronic diastolic (congestive) heart failure: Secondary | ICD-10-CM

## 2022-08-02 DIAGNOSIS — Z794 Long term (current) use of insulin: Secondary | ICD-10-CM

## 2022-08-02 NOTE — Chronic Care Management (AMB) (Signed)
Care Management    RN Visit Note  08/02/2022 Name: [name] MRN: [id number] DOB: [birth year]  Subjective: [name] is a 88 y.o. year old female who is a primary care patient of Burns, [name] Lick, MD. The care management team was consulted for assistance with disease management and care coordination needs.    Engaged with patient by telephone for follow up visit/ RN CM case closure in response to provider referral for case management and/or care coordination services.   Consent to Services:   [name] was given information about Care Management services 08/18/20 including:  Care Management services includes personalized support from designated clinical staff supervised by her physician, including individualized plan of care and coordination with other care providers 24/7 contact phone numbers for assistance for urgent and routine care needs. The patient may stop case management services at any time by phone call to the office staff.  Patient agreed to services and consent obtained.   Assessment: Review of patient past medical history, allergies, medications, health status, including review of consultants reports, laboratory and other test data, was performed as part of comprehensive evaluation and provision of chronic care management services.   SDOH (Social Determinants of Health) assessments and interventions performed:  SDOH Interventions    Flowsheet Row Most Recent Value  SDOH Interventions   Food Insecurity Interventions Intervention Not Indicated  Transportation Interventions Intervention Not Indicated  [ALF provides transportation,  family assists as needed]     Care Plan  Allergies  Allergen Reactions   Levaquin [Levofloxacin] Shortness Of Breath and Swelling    angioedema   Lobster [Shellfish Allergy] Other (See Comments)    angioedema   Penicillins Rash    Has patient had a PCN reaction causing immediate rash, facial/tongue/throat swelling, SOB or  lightheadedness with hypotension: Yes Has patient had a PCN reaction causing severe rash involving mucus membranes or skin necrosis: No Has patient had a PCN reaction that required hospitalization: No Has patient had a PCN reaction occurring within the last 10 years: No If all of the above answers are "NO", then may proceed with Cephalosporin use.   Valsartan Other (See Comments)    REACTION: angioedema   Codeine Other (See Comments)    Mental status changes   Lipitor [Atorvastatin] Other (See Comments)    weakness   Oxycodone Other (See Comments)    hallucinations   Statins     Made her too weak   Tramadol    Amlodipine Besylate Other (See Comments)    REACTION: tingling in lips & gum edema 7/12: talked to patient, states she is tolerating well   Crestor [Rosuvastatin Calcium] Rash   Tramadol Hcl Nausea And Vomiting   Outpatient Encounter Medications as of 08/02/2022  Medication Sig   acetaminophen (TYLENOL) 325 MG tablet Take 2 tablets (650 mg total) by mouth every 6 (six) hours as needed for mild pain or headache.   amLODipine (NORVASC) 2.5 MG tablet TAKE 1 TABLET BY MOUTH  DAILY AND MAY TAKE EXTRA  9.4WN FOR SYSTOLIC BLOOD  PRESSURE OVER 180   Azelastine HCl (ASTEPRO) 0.15 % SOLN Place 1 spray into the nose daily as needed (Allergies).   carvedilol (COREG) 6.25 MG tablet TAKE 1 TABLET BY MOUTH  TWICE DAILY WITH MEALS   ELIQUIS 5 MG TABS tablet TAKE 1 TABLET BY MOUTH  TWICE DAILY   fluticasone (FLONASE) 50 MCG/ACT nasal spray Place 1 spray into both nostrils daily as needed for allergies or rhinitis.  insulin aspart protamine- aspart (NOVOLOG MIX 70/30) (70-30) 100 UNIT/ML injection Inject 14 Units into the skin daily with breakfast. 12 units in the PM   Lancet Devices (LANCING DEVICE) MISC Use as advised - for Delica Lancets   Lancets (ONETOUCH DELICA PLUS CMKLKJ17H) MISC USE TO MONITOR GLUCOSE  LEVELS TWICE DAILY   Multiple Vitamins-Minerals (CENTRUM SILVER ULTRA WOMENS PO) Take  1 tablet by mouth daily.   nitroGLYCERIN (NITROSTAT) 0.4 MG SL tablet Place 1 tablet (0.4 mg total) under the tongue every 5 (five) minutes as needed for chest pain. Reported on 01/28/2016   NOVOFINE PLUS PEN NEEDLE 32G X 4 MM MISC SMARTSIG:1 Each SUB-Q Daily   ONETOUCH VERIO test strip CHECK BLOOD SUGAR TWICE  DAILY   OXYGEN Inhale 3 Doses into the lungs as needed. 3 liters   potassium chloride SA (KLOR-CON M) 20 MEQ tablet TAKE 2 TABLETS BY MOUTH  DAILY   Semaglutide,0.25 or 0.5MG/DOS, (OZEMPIC, 0.25 OR 0.5 MG/DOSE,) 2 MG/1.5ML SOPN Inject 0.5 mg into the skin once a week.   torsemide (DEMADEX) 20 MG tablet Take 2 tablets (40 mg total) by mouth 2 (two) times daily.   trimethoprim-polymyxin b (POLYTRIM) ophthalmic solution Place 1 drop into both eyes every 4 (four) hours. Use for 7 days   No facility-administered encounter medications on file as of 08/02/2022.   Patient Active Problem List   Diagnosis Date Noted   Acute bacterial conjunctivitis of both eyes 04/15/2022   Chronic respiratory failure with hypoxia ([place]) 05/12/2021   Acute respiratory failure with hypoxia and hypercapnia ([facility]) 01/31/2021   Elevated troponin 01/31/2021   Poorly controlled type 2 diabetes mellitus with circulatory disorder ([place]) 01/31/2021   Neuropathy 08/19/2020   PAD (peripheral artery disease) ([place]) 02/29/2020   Chronic kidney disease (CKD) 02/20/2020   Osteoarthritis 08/01/2019   Arthritis of left sacroiliac joint 05/04/2017   Osteopenia 02/17/2017   Piriformis syndrome of left side 02/16/2017   Acquired leg length discrepancy 02/16/2017   COPD GOLD I  01/27/2017   Lumbar radiculopathy, acute 01/27/2017   Granuloma annulare 07/27/2016   Numbness of fingers 07/27/2016   Primary osteoarthritis of right knee 06/27/2016   Rash and nonspecific skin eruption 06/09/2016   Diabetes ([place]) 03/07/2016   Acute on chronic combined systolic (congestive) and diastolic (congestive) heart failure (HCC)    OSA  08/28/2014    Chronic diastolic heart failure ([place]) 07/30/2014   Nocturnal hypoxemia 07/09/2014   SOB (shortness of breath) 05/15/2014   Pulmonary HTN ([place]) 05/15/2014   Band keratopathy 01/16/2014   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   Hyperthyroidism 11/04/2011   Multinodular goiter 10/14/2011   Atrophy, Fuchs' 09/29/2011   ATRIAL FIBRILLATION 06/03/2010   VITILIGO 11/07/2009   SINOATRIAL NODE DYSFUNCTION 06/16/2009   Atrial flutter ([place]) 06/25/2008   HYPERLIPIDEMIA 01/29/2008   Coronary atherosclerosis 01/29/2008   HTN (hypertension) 10/30/2007   Conditions to be addressed/monitored: CHF and DMII  Care Plan : RN Care Manager Plan of Care  Updates made by Knox Royalty, RN since 08/02/2022 12:00 AM     Problem: Chronic Disease Management Needs   Priority: High     Long-Range Goal: Ongoing adherence to established plan of care for long term chronic disease management   Start Date: 10/27/2021  Expected End Date: 10/27/2022  Recent Progress: On track  Priority: High  Note:   Current Barriers:  Chronic Disease Management support and education needs related to CHF and DMII- patient appreciaitive of ongoing support and reinforcement Chef prepared  meals at ALF- difficult to follow prescribed diet 03/29/22: Patient's son is undergoing new treatment regimen for cancer recurrence- this is upsetting patient and causing her stress and worry  RNCM Clinical Goal(s):  Patient will demonstrate Ongoing health management independence DMII/ CHF  through collaboration with RN Care manager, provider, and care team.   Interventions: 1:1 collaboration with primary care provider regarding development and update of comprehensive plan of care as evidenced by provider attestation and co-signature Inter-disciplinary care team collaboration (see longitudinal plan of care) Evaluation of current treatment plan related to  self management and patient's adherence to plan as established by provider Review of  patient status, including review of consultants reports, relevant laboratory and other test results, and medications completed SDOH updated: no new/ unmet concerns identified Pain assessment updated: denies pain today Falls assessment updated: continues to deny new/ recent falls x 12 months- continues using walker/ cane as/ if needed;  positive reinforcement provided with encouragement to continue efforts at fall prevention; previously provided education around fall risks/ prevention reinforced Medications discussed:  reports continues to independently self-manage and denies current concerns/ issues/ questions around medications; endorses adherence to taking all medications as prescribed Discussed current clinical condition: reports "doing fine;" states she "feels great," and is very happy that she has gotten such good feedback from her doctors recently Reviewed upcoming scheduled provider appointments: 08/04/22- CHF provider; 09/06/22- PCP/ [facility]; patient confirms is aware of all and has plans to attend as scheduled Discussed plans with patient for ongoing care management follow up- patient denies current care coordination/ care management needs and is agreeable to CCM RN CM case closure today; verbalizes understanding to contact PCP or other care providers for any needs that arise in the future, and confirms she has contact information for all care providers     Diabetes Interventions: 08/02/22: Goal Status: Goal met; Long-Term Goal Provided education to patient about basic DM disease process Assessed social determinant of health barriers Reviewed recent endocrinology office visit 07/29/22-- discussed decrease in A1-C from 8.4 (03/26/22) to current 7.0 (07/29/22)-- she is very happy about her improved value and states that "Dr. [name] was too" Reviewed individual historical A1-C trends and reinforced previously provided education around correlation of A1-C value to blood sugar levels at home over 3  months Confirmed patient has resumed Ozempic-- she finally did qualify for PAP and obtained this medication and endorses taking as prescribed-- she is also very happy that she has lost weight while taking this medication: reports is currently weighing "around "158 lbs" which is decreased from last reported value of "166 lbs" Confirmed patient continues monitoring / writing down on paper blood sugars at home: positive reinforcement provided with encouragement to continue efforts; confirms no recent low blood sugar values, no signs/ symptoms hypoglycemia Confirmed she continues to remain active-- going to yoga regularly, walking a half-mile "about three times per week;" positive reinforcement provided with encouragement to continue efforts Confirmed patient continues to try and follow carb-modified/ and low sugar diet around chef prepared meals at ALF: discussed ongoing strategies to comply with prescribed diet while at ALF that prepares food that isn't on her diet: positive reinforcement provided with encouragement to continue efforts Reinforced self-health management strategies for routine daily self-care-- for both DMII/ CHF  Lab Results  Component Value Date   HGBA1C 7.0 (A) 07/29/2022  Heart Failure Interventions: 08/02/22: Goal Status: Goal met; Long-Term Goal Provided education on low sodium diet Discussed importance of daily weight and advised patient to weigh and record  daily Discussed the importance of keeping all appointments with provider Provided patient with education about the role of exercise in the management of heart failure Confirmed patient continues monitoring/ recording daily weights at home: reports weight today of "158 lbs;" this weight is decreased from last reported value of "166 lbs" which she sttributes to taking recently obtained Ozempic; she denies recent weight gain > 3 lbs overnight/ 5 lbs in one week Confirmed continues to follow heart healthy, low salt, low cholesterol  diet- positive reinforcement provided with encouragement to continue efforts Confirmed patient remains active at baseline-- benefits of activity reinforced Confirmed patient continues using home O2 prn-- reports "really helps;" but only uses when needed, currently using 2 L/min for short periods throughout day, as needed/ indicated; reports no longer using CPAP at night-- states "the lung doctor told me I was doing so good, I don't have to use it any more since I am doing so well with the just the home O2 at night"      Plan:  No further follow up required: patient denies current care coordination/ care management needs and is agreeable to RN CM case closure today; RN CM case closure accordingly     [name], RN, BSN, [place] [phone]: direct office

## 2022-08-04 ENCOUNTER — Encounter (HOSPITAL_COMMUNITY): Payer: Self-pay | Admitting: Internal Medicine

## 2022-08-04 ENCOUNTER — Other Ambulatory Visit (HOSPITAL_COMMUNITY): Payer: Self-pay

## 2022-08-04 ENCOUNTER — Ambulatory Visit (HOSPITAL_COMMUNITY)
Admission: RE | Admit: 2022-08-04 | Discharge: 2022-08-04 | Disposition: A | Discharge status: Home / Self Care | Payer: Medicare Other | Source: Ambulatory Visit | Attending: Internal Medicine | Admitting: Internal Medicine

## 2022-08-04 VITALS — BP 196/70 | HR 72 | Wt 159.4 lb

## 2022-08-04 DIAGNOSIS — Z9861 Coronary angioplasty status: Secondary | ICD-10-CM | POA: Insufficient documentation

## 2022-08-04 DIAGNOSIS — Z79899 Other long term (current) drug therapy: Secondary | ICD-10-CM | POA: Insufficient documentation

## 2022-08-04 DIAGNOSIS — J449 Chronic obstructive pulmonary disease, unspecified: Secondary | ICD-10-CM | POA: Diagnosis not present

## 2022-08-04 DIAGNOSIS — I10 Essential (primary) hypertension: Secondary | ICD-10-CM

## 2022-08-04 DIAGNOSIS — I482 Chronic atrial fibrillation, unspecified: Secondary | ICD-10-CM | POA: Diagnosis not present

## 2022-08-04 DIAGNOSIS — Z7902 Long term (current) use of antithrombotics/antiplatelets: Secondary | ICD-10-CM | POA: Insufficient documentation

## 2022-08-04 DIAGNOSIS — Z7901 Long term (current) use of anticoagulants: Secondary | ICD-10-CM | POA: Insufficient documentation

## 2022-08-04 DIAGNOSIS — G4733 Obstructive sleep apnea (adult) (pediatric): Secondary | ICD-10-CM | POA: Insufficient documentation

## 2022-08-04 DIAGNOSIS — I5032 Chronic diastolic (congestive) heart failure: Secondary | ICD-10-CM | POA: Diagnosis not present

## 2022-08-04 DIAGNOSIS — I2510 Atherosclerotic heart disease of native coronary artery without angina pectoris: Secondary | ICD-10-CM | POA: Insufficient documentation

## 2022-08-04 DIAGNOSIS — I110 Hypertensive heart disease with heart failure: Secondary | ICD-10-CM | POA: Insufficient documentation

## 2022-08-04 DIAGNOSIS — I4892 Unspecified atrial flutter: Secondary | ICD-10-CM | POA: Diagnosis not present

## 2022-08-04 DIAGNOSIS — I739 Peripheral vascular disease, unspecified: Secondary | ICD-10-CM | POA: Diagnosis not present

## 2022-08-04 DIAGNOSIS — I252 Old myocardial infarction: Secondary | ICD-10-CM | POA: Insufficient documentation

## 2022-08-04 DIAGNOSIS — E119 Type 2 diabetes mellitus without complications: Secondary | ICD-10-CM | POA: Diagnosis not present

## 2022-08-04 MED ORDER — NITROGLYCERIN 0.4 MG SL SUBL
0.4000 mg | SUBLINGUAL_TABLET | SUBLINGUAL | 3 refills | Status: DC | PRN
Start: 2022-08-04 — End: 2023-12-30

## 2022-08-04 MED ORDER — AMLODIPINE BESYLATE 5 MG PO TABS: 5.0000 mg | ORAL_TABLET | Freq: Every day | ORAL | 3 refills | Status: DC

## 2022-08-04 NOTE — Progress Notes (Signed)
Advanced Heart Failure Clinic Note   Date:  08/04/2022   ID:  [name], DOB 13-Jan-1931, MRN [id number]  Location: Home  Provider location: [facility] Type of Visit: Established patient  PCP:  [name], MD  Cardiologist:  Pixie Casino, MD Primary HF: [name]  Chief Complaint: Heart Failure follow-up   History of Present Illness:  [name] is a 88 y.o. female with CAD s/p 1v stent, HTN, atrial fibrillation/A flutter and chronic prior systolic heart failure, (which was likely rate related) and diastolic heart failure. She was referred by Dr. Lovena Le for further evaluation of Pulmonary HTN.   Echo 2/22 EF 40-45%   Underwent R/L cath 05/07/14 Which showed stable CAD and significant PH with normal PVR.   In 4/21 saw Dr. Scot Dock and had LE angio with severe PAD underwent stenting of 70% R external iliac   Here with her daughter for f/u. Previously she enrolled with home Palliative Care. Feels great. Breathing ok. No SOB, CP, orthopnea or PND. Now ozempic.    Cardiac studies:  Myoview 9/20  Nuclear stress EF: 56%. There was no ST segment deviation noted during stress. Defect 1: There is a medium defect of moderate severity present in the mid inferolateral, apical inferior and apical lateral location. Findings consistent with possible mild ischemia in the apical inferior and inferolateral regions. The left ventricular ejection fraction is normal (55-65%).    Admitted in 5/21 with NSTEMI hstrop peak 1,455. Echo 5/21: EF 50-55%  Cath 05/16/20 with  1. Severe 3 vessel obstructive CAD 2. Severe pulmonary HTN 3. Successful PCI of the proximal LCx/OM1 with DES x 1 with IVUS guidance 4. Successful PCI of the proximal LAD with orbital atherectomy and DES x 1  RA = 6 RV = 75/6 PA = 77/26 (45) PCW = 10 Fick cardiac output/index = 4.0/2.2 PVR = 8.8 WU FA sat = 99% PA sat = 67%, 73%  Admitted 2/22 with volume overload.  Diuresed. Repeat RHC on 02/04/21 as below. Diuresed and started on sildenafil. D/c weight 167  RA = 6 RV = 88/5 PA =  86/33 (53) PCW = 21 Fick cardiac output/index = 3.4/1.9 PVR = 9.4 WU Ao sat = 97% PA sat = 64%, 64% PAPi = 8.8    Past Medical History:  Diagnosis Date   Anemia    iron defficiency   Anginal pain (HCC)    Arthritis    "knees, feet, hands; joints" (05/27/2015)   Asthma    Atrial fibrillation or flutter    s/p RFCA 7/08;   s/p DCCV in past;   previously on amiodarone;  amio stopped due to lung toxicity   CAD (coronary artery disease)    s/p NSTEMI tx with BMS to OM1 3/08;  cath 3/08: pOM 99% tx with PCI, pLAD 20%, ? mod stenosis at the AM   CAP (community acquired pneumonia) 05/24/2015   CHF (congestive heart failure) ([place])    Chronic diastolic heart failure ([place])    echo 11/11:  EF 55-60%, severe LVH, mod LAE, mild MR, mildly increased PASP   COPD (chronic obstructive pulmonary disease) (HCC)    Degenerative joint disease    Dystrophy, corneal stromal    Heart murmur    HLD (hyperlipidemia)    HTN (hypertension)    essential nos   Hypopotassemia    PMH of   Muscle pain    Myocardial infarction ([place]) 2008   OSA on  CPAP    Osteoporosis    Pneumonia 05/27/2015   Protein calorie malnutrition (HCC)    Rash and nonspecific skin eruption    both arms,awaiting bio   dr Delman Cheadle   Seasonal allergies    Shortness of breath dyspnea    Type II diabetes mellitus ([place])    Dr Loanne Drilling   Past Surgical History:  Procedure Laterality Date   A FLUTTER ABLATION     Dr Lovena Le   ABDOMINAL AORTOGRAM W/LOWER EXTREMITY Bilateral 04/11/2020   Procedure: ABDOMINAL AORTOGRAM W/LOWER EXTREMITY;  Surgeon: Angelia Mould, MD;  Location: [facility];  Service: Cardiovascular;  Laterality: Bilateral;   CATARACT EXTRACTION W/ INTRAOCULAR LENS  IMPLANT, BILATERAL Bilateral    CHOLECYSTECTOMY     COLONOSCOPY  6/07   2 polyps Dr. Kinnie Feil in [place]   colonoscopy with  polypectomy      X 2; [facility]   CORNEAL TRANSPLANT Bilateral    CORONARY ANGIOPLASTY WITH STENT PLACEMENT  02/2007   BMS w/Dr Lovena Le   CORONARY ATHERECTOMY N/A 05/16/2020   Procedure: CORONARY ATHERECTOMY;  Surgeon: Martinique, Peter M, MD;  Location: [facility];  Service: Cardiovascular;  Laterality: N/A;   CORONARY BALLOON ANGIOPLASTY N/A 05/16/2020   Procedure: CORONARY BALLOON ANGIOPLASTY;  Surgeon: Martinique, Peter M, MD;  Location: [facility];  Service: Cardiovascular;  Laterality: N/A;   CORONARY STENT INTERVENTION N/A 05/16/2020   Procedure: CORONARY STENT INTERVENTION;  Surgeon: Martinique, Peter M, MD;  Location: [facility];  Service: Cardiovascular;  Laterality: N/A;   EYE SURGERY     gravid 2 para 2     INTRAVASCULAR ULTRASOUND/IVUS N/A 05/16/2020   Procedure: Intravascular Ultrasound/IVUS;  Surgeon: Martinique, Peter M, MD;  Location: [facility];  Service: Cardiovascular;  Laterality: N/A;   KNEE CARTILAGE SURGERY Right    LEFT AND RIGHT HEART CATHETERIZATION WITH CORONARY ANGIOGRAM N/A 05/07/2014   Procedure: LEFT AND RIGHT HEART CATHETERIZATION WITH CORONARY ANGIOGRAM;  Surgeon: Peter M Martinique, MD;  Location: [facility];  Service: Cardiovascular;  Laterality: N/A;   PERIPHERAL VASCULAR INTERVENTION  04/11/2020   Procedure: PERIPHERAL VASCULAR INTERVENTION;  Surgeon: Angelia Mould, MD;  Location: [facility];  Service: Cardiovascular;;   RIGHT HEART CATH N/A 02/04/2021   Procedure: RIGHT HEART CATH;  Surgeon: Jolaine Artist, MD;  Location: [facility];  Service: Cardiovascular;  Laterality: N/A;   RIGHT/LEFT HEART CATH AND CORONARY ANGIOGRAPHY N/A 05/16/2020   Procedure: RIGHT/LEFT HEART CATH AND CORONARY ANGIOGRAPHY;  Surgeon: Jolaine Artist, MD;  Location: [facility];  Service: Cardiovascular;  Laterality: N/A;   TOTAL KNEE ARTHROPLASTY Right 06/28/2016   Procedure: TOTAL KNEE ARTHROPLASTY;  Surgeon: Frederik Pear, MD;   Location: [facility];  Service: Orthopedics;  Laterality: Right;     Current Outpatient Medications  Medication Sig Dispense Refill   acetaminophen (TYLENOL) 325 MG tablet Take 2 tablets (650 mg total) by mouth every 6 (six) hours as needed for mild pain or headache. 20 tablet 0   amLODipine (NORVASC) 2.5 MG tablet TAKE 1 TABLET BY MOUTH  DAILY AND MAY TAKE EXTRA  2.'5MG'$  FOR SYSTOLIC BLOOD  PRESSURE OVER 180 180 tablet 3   Azelastine HCl (ASTEPRO) 0.15 % SOLN Place 1 spray into the nose daily as needed (Allergies).     carvedilol (COREG) 6.25 MG tablet TAKE 1 TABLET BY MOUTH  TWICE DAILY WITH MEALS 180 tablet 3   ELIQUIS 5 MG TABS tablet  TAKE 1 TABLET BY MOUTH  TWICE DAILY 60 tablet 11   fluticasone (FLONASE) 50 MCG/ACT nasal spray Place 1 spray into both nostrils daily as needed for allergies or rhinitis. 16 g 2   insulin aspart protamine- aspart (NOVOLOG MIX 70/30) (70-30) 100 UNIT/ML injection Inject 14 Units into the skin daily with breakfast. 12 units in the PM     Lancet Devices (LANCING DEVICE) MISC Use as advised - for Delica Lancets 1 each 0   Lancets (ONETOUCH DELICA PLUS OTLXBW62M) MISC USE TO MONITOR GLUCOSE  LEVELS TWICE DAILY 200 each 3   Multiple Vitamins-Minerals (CENTRUM SILVER ULTRA WOMENS PO) Take 1 tablet by mouth daily.     nitroGLYCERIN (NITROSTAT) 0.4 MG SL tablet Place 1 tablet (0.4 mg total) under the tongue every 5 (five) minutes as needed for chest pain. Reported on 01/28/2016 30 tablet 3   NOVOFINE PLUS PEN NEEDLE 32G X 4 MM MISC SMARTSIG:1 Each SUB-Q Daily 200 each 1   ONETOUCH VERIO test strip CHECK BLOOD SUGAR TWICE  DAILY 200 strip 3   OXYGEN Inhale 3 Doses into the lungs as needed. 3 liters     potassium chloride SA (KLOR-CON M) 20 MEQ tablet TAKE 2 TABLETS BY MOUTH  DAILY 180 tablet 3   Semaglutide,0.25 or 0.'5MG'$ /DOS, (OZEMPIC, 0.25 OR 0.5 MG/DOSE,) 2 MG/1.5ML SOPN Inject 0.5 mg into the skin once a week. 4.5 mL 3   torsemide (DEMADEX) 20 MG tablet Take 2 tablets (40 mg  total) by mouth 2 (two) times daily. 20 tablet 0   No current facility-administered medications for this encounter.    Allergies:   Levaquin [levofloxacin], Lobster [shellfish allergy], Penicillins, Valsartan, Codeine, Lipitor [atorvastatin], Oxycodone, Statins, Tramadol, Amlodipine besylate, Crestor [rosuvastatin calcium], and Tramadol hcl   Social History:  The patient  reports that she quit smoking about 53 years ago. Her smoking use included cigarettes. She has a 4.50 pack-year smoking history. She has never used smokeless tobacco. She reports current alcohol use. She reports that she does not use drugs.   Family History:  The patient's family history includes Arthritis in her father, maternal aunt, and mother; Breast cancer in her maternal aunt; Diabetes in her mother; Heart attack (age of onset: 64) in her father; Hypertension in her father and mother; Transient ischemic attack in her mother.   ROS:  Please see the history of present illness.   All other systems are personally reviewed and negative.   Vitals:   08/04/22 1455  BP: (!) 190/80  Pulse: 72  SpO2: 92%  Weight: 72.3 kg (159 lb 6.4 oz)   Wt Readings from Last 3 Encounters:  08/04/22 72.3 kg (159 lb 6.4 oz)  07/29/22 71.7 kg (158 lb)  06/14/22 73.8 kg (162 lb 12.8 oz)     Exam:   General:  Looks younger than stated age. No resp difficulty HEENT: normal Neck: supple. no JVD. Carotids 2+ bilat; no bruits. No lymphadenopathy or thryomegaly appreciated. Cor: PMI nondisplaced. Irregular rate & rhythm. No rubs, gallops or murmurs. Lungs: clear but decreased throughout  Abdomen: soft, nontender, nondistended. No hepatosplenomegaly. No bruits or masses. Good bowel sounds. Extremities: no cyanosis, clubbing, rash, edema Neuro: alert & orientedx3, cranial nerves grossly intact. moves all 4 extremities w/o difficulty. Affect pleasant  Recent Labs: 12/17/2021: B Natriuretic Peptide 277.8; BUN 31; Creatinine, Ser 1.24;  Hemoglobin 16.2; Platelets 179; Potassium 4.4; Sodium 140  Personally reviewed   Wt Readings from Last 3 Encounters:  08/04/22 72.3 kg (159 lb 6.4  oz)  07/29/22 71.7 kg (158 lb)  06/14/22 73.8 kg (162 lb 12.8 oz)     ASSESSMENT AND PLAN:  1) Chronic diastolic CHF:  - Echo 07/5630 EF 60-65%, LA severely dilated, Echo 10/2017: EF 65-70%, grade 3 DD - Echo 5/21 EF 50-55% - Echo 2/22 EF 40-45% - Doing very well. NYHA II - Volume status looks good - Continue torsemide 40 bid - Not on Entresto due to h/o angioedema with ARB - Not interested in further HF med titration.   2) CAD - H/o of stent in 2008 - NSTEMI 5/21. Cath as above. S/p PCI to LAD & OM - No s/s angina - On Plavix, apixaban. Will stop Plavix as she is 1 year out from PCI and high risk for bleeding.  - Unable to tolerate statins. Was on Repatha/zetia per [facility] but she stopped because she wanted to limit her meds.  3) PAD - severe  - s/p stenting to R external iliac with Dr. Scot Dock - Denies claudication  4) PAH - Moderate to severe by cath 5/21 and 2/22.  - suspect multifactorial  - wearing CPAP - she stopped sildenafil due to side effects  5) Chronic atrial fib/flutter - Rate controlled  This patients CHA2DS2-VASc Score and unadjusted Ischemic Stroke Rate (% per year) is equal to 7.2 % stroke rate/year from a score of 5 Above score calculated as 2 points each if present [Age > 75, or Stroke/TIA/TE] - Continue Eliquis.  No bleeding   6) OSA/COPD - Follows with Dr. Halford Chessman.  - Compliant with CPAP   7) HTN - Blood pressure markedly elevated here but at other visits in flowsheet SBP mostly 134-140 - Increase amlodipine 2.5 -> 5 bid - Goal BP 130-160  - Can take extra amlodipine 2.5 for SBP > 170 - Asked her to follow BP more closely at home and let me know if SBP persistently > 160  8) DM2 - Stopped Jardiance due to cost  - No change  9) HL  - Unable to tolerate statins. - As above  10) DNR/DNI -  continue Palliative Care support    Signed, Glori Bickers, MD  08/04/2022 3:31 PM  Advanced Heart Failure [facility] [address] [facility] and [place] [address] [phone] (office) [phone] (fax)

## 2022-08-04 NOTE — Patient Instructions (Signed)
Increase Amlodipine to '5mg'$  daily  Please take your blood pressure daily, if it you systolic (Top Number) consistantly stays above 160 let us know.  Your physician recommends that you schedule a follow-up appointment in: 6 months ( February 2024)  ** please call the office in November to arrange your follow up appointment.  If you have any questions or concerns before your next appointment please send [place] a message through [place] or call our office at [phone].    TO LEAVE A MESSAGE FOR THE NURSE SELECT OPTION 2, PLEASE LEAVE A MESSAGE INCLUDING: YOUR NAME DATE OF BIRTH CALL BACK NUMBER REASON FOR CALL**this is important as we prioritize the call backs  YOU WILL RECEIVE A CALL BACK THE SAME DAY AS LONG AS YOU CALL BEFORE 4:00 PM  At the [facility], you and your health needs are our priority. As part of our continuing mission to provide you with exceptional heart care, we have created designated Provider Care Teams. These Care Teams include your primary Cardiologist (physician) and Advanced Practice Providers (APPs- Physician Assistants and Nurse Practitioners) who all work together to provide you with the care you need, when you need it.   You may see any of the following providers on your designated Care Team at your next follow up: Dr Glori Bickers Dr Haynes Kerns, NP Lyda Jester, [place] [facility] [place], [place] Audry Riles, PharmD   Please be sure to bring in all your medications bottles to every appointment.

## 2022-08-04 NOTE — Progress Notes (Signed)
Medication Samples have been provided to the patient.  Drug name: Eliquis       Strength: '5mg'$         Qty: 1  LOT: [id number]  Exp.Date: 03/2024   Dosing instructions: Take 1 tablet Twice daily   The patient has been instructed regarding the correct time, dose, and frequency of taking this medication, including desired effects and most common side effects.   [name] [name] 3:54 PM 08/04/2022

## 2022-08-07 ENCOUNTER — Other Ambulatory Visit (HOSPITAL_COMMUNITY): Payer: Self-pay | Admitting: Cardiology

## 2022-08-31 ENCOUNTER — Encounter (HOSPITAL_COMMUNITY): Payer: Self-pay | Admitting: Internal Medicine

## 2022-09-01 ENCOUNTER — Encounter (HOSPITAL_COMMUNITY): Payer: Self-pay

## 2022-09-01 ENCOUNTER — Telehealth (HOSPITAL_COMMUNITY): Payer: Self-pay | Admitting: Pharmacy Technician

## 2022-09-01 NOTE — Telephone Encounter (Signed)
Advanced Heart Failure Patient Advocate Encounter  Patient called requesting a month of Eliquis samples. Neville Route (RN) a request for samples. Called and left patient vm confirming samples are available.  Charlann Boxer, CPhT

## 2022-09-01 NOTE — Progress Notes (Unsigned)
Medication Samples have been provided to the patient.  Drug name: Eliquis       Strength: 5 mg        Qty: 4  LOT: [id number]  Exp.Date: 04/2024   Dosing instructions: Take 1 tablet Twice daily   The patient has been instructed regarding the correct time, dose, and frequency of taking this medication, including desired effects and most common side effects.   [name] [name] [name] 1:30 PM 09/01/2022

## 2022-09-03 ENCOUNTER — Encounter (HOSPITAL_COMMUNITY): Payer: Self-pay | Admitting: Internal Medicine

## 2022-09-06 ENCOUNTER — Ambulatory Visit (INDEPENDENT_AMBULATORY_CARE_PROVIDER_SITE_OTHER): Payer: Medicare Other

## 2022-09-06 VITALS — BP 143/63 | Ht 63.0 in | Wt 157.0 lb

## 2022-09-06 DIAGNOSIS — Z0000 Encounter for general adult medical examination without abnormal findings: Secondary | ICD-10-CM | POA: Diagnosis not present

## 2022-09-06 NOTE — Patient Instructions (Addendum)
[name] , Thank you for taking time to come for your Medicare Wellness Visit. I appreciate your ongoing commitment to your health goals. Please review the following plan we discussed and let me know if I can assist you in the future.   Screening recommendations/referrals: Colonoscopy: No longer recommended due to age. Mammogram: No longer recommended due to age. Bone Density: No longer recommended due to age. Recommended yearly ophthalmology/optometry visit for glaucoma screening and checkup Recommended yearly dental visit for hygiene and checkup  Vaccinations: Influenza vaccine: 10/07/2021; due Fall 2023 Pneumococcal vaccine: 05/31/2015, 07/31/2018 Tdap vaccine: due Shingles vaccine: due   Covid-19: 02/08/2020, 03/10/2020, 11/03/2020  Advanced directives: Living Will and DNR  Conditions/risks identified: Yes  Next appointment: 09/08/2023 at 9:15 p a.m. telephone visit with Mignon Pine, Nurse Health Advisor.  If you need to reschedule or cancel, please call [phone].  Preventive Care 41 Years and Older, Female Preventive care refers to lifestyle choices and visits with your health care provider that can promote health and wellness. What does preventive care include? A yearly physical exam. This is also called an annual well check. Dental exams once or twice a year. Routine eye exams. Ask your health care provider how often you should have your eyes checked. Personal lifestyle choices, including: Daily care of your teeth and gums. Regular physical activity. Eating a healthy diet. Avoiding tobacco and drug use. Limiting alcohol use. Practicing safe sex. Taking low-dose aspirin every day. Taking vitamin and mineral supplements as recommended by your health care provider. What happens during an annual well check? The services and screenings done by your health care provider during your annual well check will depend on your age, overall health, lifestyle risk factors, and family history  of disease. Counseling  Your health care provider may ask you questions about your: Alcohol use. Tobacco use. Drug use. Emotional well-being. Home and relationship well-being. Sexual activity. Eating habits. History of falls. Memory and ability to understand (cognition). Work and work Statistician. Reproductive health. Screening  You may have the following tests or measurements: Height, weight, and BMI. Blood pressure. Lipid and cholesterol levels. These may be checked every 5 years, or more frequently if you are over 65 years old. Skin check. Lung cancer screening. You may have this screening every year starting at age 43 if you have a 30-pack-year history of smoking and currently smoke or have quit within the past 15 years. Fecal occult blood test (FOBT) of the stool. You may have this test every year starting at age 31. Flexible sigmoidoscopy or colonoscopy. You may have a sigmoidoscopy every 5 years or a colonoscopy every 10 years starting at age 44. Hepatitis C blood test. Hepatitis B blood test. Sexually transmitted disease (STD) testing. Diabetes screening. This is done by checking your blood sugar (glucose) after you have not eaten for a while (fasting). You may have this done every 1-3 years. Bone density scan. This is done to screen for osteoporosis. You may have this done starting at age 68. Mammogram. This may be done every 1-2 years. Talk to your health care provider about how often you should have regular mammograms. Talk with your health care provider about your test results, treatment options, and if necessary, the need for more tests. Vaccines  Your health care provider may recommend certain vaccines, such as: Influenza vaccine. This is recommended every year. Tetanus, diphtheria, and acellular pertussis (Tdap, Td) vaccine. You may need a Td booster every 10 years. Zoster vaccine. You may need this after age  60. Pneumococcal 13-valent conjugate (PCV13) vaccine. One  dose is recommended after age 66. Pneumococcal polysaccharide (PPSV23) vaccine. One dose is recommended after age 75. Talk to your health care provider about which screenings and vaccines you need and how often you need them. This information is not intended to replace advice given to you by your health care provider. Make sure you discuss any questions you have with your health care provider. Document Released: 01/09/2016 Document Revised: 09/01/2016 Document Reviewed: 10/14/2015 Elsevier Interactive Patient Education  2017 [place] Prevention in the Home Falls can cause injuries. They can happen to people of all ages. There are many things you can do to make your home safe and to help prevent falls. What can I do on the outside of my home? Regularly fix the edges of walkways and driveways and fix any cracks. Remove anything that might make you trip as you walk through a door, such as a raised step or threshold. Trim any bushes or trees on the path to your home. Use bright outdoor lighting. Clear any walking paths of anything that might make someone trip, such as rocks or tools. Regularly check to see if handrails are loose or broken. Make sure that both sides of any steps have handrails. Any raised decks and porches should have guardrails on the edges. Have any leaves, snow, or ice cleared regularly. Use sand or salt on walking paths during winter. Clean up any spills in your garage right away. This includes oil or grease spills. What can I do in the bathroom? Use night lights. Install grab bars by the toilet and in the tub and shower. Do not use towel bars as grab bars. Use non-skid mats or decals in the tub or shower. If you need to sit down in the shower, use a plastic, non-slip stool. Keep the floor dry. Clean up any water that spills on the floor as soon as it happens. Remove soap buildup in the tub or shower regularly. Attach bath mats securely with double-sided  non-slip rug tape. Do not have throw rugs and other things on the floor that can make you trip. What can I do in the bedroom? Use night lights. Make sure that you have a light by your bed that is easy to reach. Do not use any sheets or blankets that are too big for your bed. They should not hang down onto the floor. Have a firm chair that has side arms. You can use this for support while you get dressed. Do not have throw rugs and other things on the floor that can make you trip. What can I do in the kitchen? Clean up any spills right away. Avoid walking on wet floors. Keep items that you use a lot in easy-to-reach places. If you need to reach something above you, use a strong step stool that has a grab bar. Keep electrical cords out of the way. Do not use floor polish or wax that makes floors slippery. If you must use wax, use non-skid floor wax. Do not have throw rugs and other things on the floor that can make you trip. What can I do with my stairs? Do not leave any items on the stairs. Make sure that there are handrails on both sides of the stairs and use them. Fix handrails that are broken or loose. Make sure that handrails are as long as the stairways. Check any carpeting to make sure that it is firmly attached to the stairs. Fix  any carpet that is loose or worn. Avoid having throw rugs at the top or bottom of the stairs. If you do have throw rugs, attach them to the floor with carpet tape. Make sure that you have a light switch at the top of the stairs and the bottom of the stairs. If you do not have them, ask someone to add them for you. What else can I do to help prevent falls? Wear shoes that: Do not have high heels. Have rubber bottoms. Are comfortable and fit you well. Are closed at the toe. Do not wear sandals. If you use a stepladder: Make sure that it is fully opened. Do not climb a closed stepladder. Make sure that both sides of the stepladder are locked into place. Ask  someone to hold it for you, if possible. Clearly mark and make sure that you can see: Any grab bars or handrails. First and last steps. Where the edge of each step is. Use tools that help you move around (mobility aids) if they are needed. These include: Canes. Walkers. Scooters. Crutches. Turn on the lights when you go into a dark area. Replace any light bulbs as soon as they burn out. Set up your furniture so you have a clear path. Avoid moving your furniture around. If any of your floors are uneven, fix them. If there are any pets around you, be aware of where they are. Review your medicines with your doctor. Some medicines can make you feel dizzy. This can increase your chance of falling. Ask your doctor what other things that you can do to help prevent falls. This information is not intended to replace advice given to you by your health care provider. Make sure you discuss any questions you have with your health care provider. Document Released: 10/09/2009 Document Revised: 05/20/2016 Document Reviewed: 01/17/2015 Elsevier Interactive Patient Education  2017 [employer].

## 2022-09-06 NOTE — Progress Notes (Signed)
Subjective:   [name] is a 88 y.o. female who presents for Medicare Annual (Subsequent) preventive examination.  Review of Systems    Virtual Visit via Telephone Note  I connected with  [name] on 09/06/22 at  9:15 AM EDT by telephone and verified that I am speaking with the correct person using two identifiers.  Location: Patient: Home Provider: [place] Persons participating in the virtual visit: [place]   I discussed the limitations, risks, security and privacy concerns of performing an evaluation and management service by telephone and the availability of in person appointments. The patient expressed understanding and agreed to proceed.  Interactive audio and video telecommunications were attempted between this nurse and patient, however failed, due to patient having technical difficulties OR patient did not have access to video capability.  We continued and completed visit with audio only.  Some vital signs may be absent or patient reported.   [name] Flow, LPN  Cardiac Risk Factors include: advanced age (>54mn, >>70women);diabetes mellitus;family history of premature cardiovascular disease;hypertension     Objective:    Today's Vitals   09/06/22 0920  BP: (!) 143/63  Weight: 157 lb (71.2 kg)  Height: '5\' 3"'$  (1.6 m)  PainSc: 0-No pain   Body mass index is 27.81 kg/m.     09/06/2022    9:25 AM 09/03/2021    9:53 AM 03/10/2021   10:15 AM 01/31/2021    7:46 AM 05/15/2020    8:04 PM 05/15/2020   12:18 PM 04/11/2020    9:33 AM  Advanced Directives  Does Patient Have a Medical Advance Directive? Yes Yes Yes No Yes No Yes  Type of Advance Directive Living will [place] will;Out of facility DNR (pink MOST or yellow form)  [place] will  [place] will  Does patient want to make changes to medical  advance directive?  No - Patient declined No - Patient declined  No - Patient declined    Copy of [place] Chart?  Yes - validated most recent copy scanned in chart (See row information)       Would patient like information on creating a medical advance directive?    No - Patient declined  No - Patient declined     Current Medications (verified) Outpatient Encounter Medications as of 09/06/2022  Medication Sig   acetaminophen (TYLENOL) 325 MG tablet Take 2 tablets (650 mg total) by mouth every 6 (six) hours as needed for mild pain or headache.   amLODipine (NORVASC) 5 MG tablet Take 1 tablet (5 mg total) by mouth daily.   Azelastine HCl (ASTEPRO) 0.15 % SOLN Place 1 spray into the nose daily as needed (Allergies).   carvedilol (COREG) 6.25 MG tablet TAKE 1 TABLET BY MOUTH  TWICE DAILY WITH MEALS   ELIQUIS 5 MG TABS tablet TAKE 1 TABLET BY MOUTH  TWICE DAILY   fluticasone (FLONASE) 50 MCG/ACT nasal spray Place 1 spray into both nostrils daily as needed for allergies or rhinitis.   insulin aspart protamine- aspart (NOVOLOG MIX 70/30) (70-30) 100 UNIT/ML injection Inject 14 Units into the skin daily with breakfast. 12 units in the PM   Lancet Devices (LANCING DEVICE) MISC Use as advised - for Delica Lancets   Lancets (ONETOUCH DELICA PLUS [id number] MISC USE TO MONITOR GLUCOSE  LEVELS TWICE DAILY   Multiple Vitamins-Minerals (CENTRUM SILVER ULTRA WOMENS PO) Take 1  tablet by mouth daily.   nitroGLYCERIN (NITROSTAT) 0.4 MG SL tablet Place 1 tablet (0.4 mg total) under the tongue every 5 (five) minutes as needed for chest pain. Reported on 01/28/2016   NOVOFINE PLUS PEN NEEDLE 32G X 4 MM MISC SMARTSIG:1 Each SUB-Q Daily   ONETOUCH VERIO test strip CHECK BLOOD SUGAR TWICE  DAILY   OXYGEN Inhale 3 Doses into the lungs as needed. 3 liters   potassium chloride SA (KLOR-CON M) 20 MEQ tablet TAKE 2 TABLETS BY MOUTH  DAILY   Semaglutide,0.25 or 0.'5MG'$ /DOS, (OZEMPIC, 0.25 OR 0.5 MG/DOSE,)  2 MG/1.5ML SOPN Inject 0.5 mg into the skin once a week.   torsemide (DEMADEX) 20 MG tablet TAKE 2 TABLETS BY MOUTH  TWICE DAILY   No facility-administered encounter medications on file as of 09/06/2022.    Allergies (verified) Levaquin [levofloxacin], Lobster [shellfish allergy], Penicillins, Valsartan, Codeine, Lipitor [atorvastatin], Oxycodone, Statins, Tramadol, Amlodipine besylate, Crestor [rosuvastatin calcium], and Tramadol hcl   History: Past Medical History:  Diagnosis Date   Anemia    iron defficiency   Anginal pain (HCC)    Arthritis    "knees, feet, hands; joints" (05/27/2015)   Asthma    Atrial fibrillation or flutter    s/p RFCA 7/08;   s/p DCCV in past;   previously on amiodarone;  amio stopped due to lung toxicity   CAD (coronary artery disease)    s/p NSTEMI tx with BMS to OM1 3/08;  cath 3/08: pOM 99% tx with PCI, pLAD 20%, ? mod stenosis at the AM   CAP (community acquired pneumonia) 05/24/2015   CHF (congestive heart failure) ([place])    Chronic diastolic heart failure ([place])    echo 11/11:  EF 55-60%, severe LVH, mod LAE, mild MR, mildly increased PASP   COPD (chronic obstructive pulmonary disease) (HCC)    Degenerative joint disease    Dystrophy, corneal stromal    Heart murmur    HLD (hyperlipidemia)    HTN (hypertension)    essential nos   Hypopotassemia    PMH of   Muscle pain    Myocardial infarction ([place]) 2008   OSA on CPAP    Osteoporosis    Pneumonia 05/27/2015   Protein calorie malnutrition (HCC)    Rash and nonspecific skin eruption    both arms,awaiting bio   dr Delman Cheadle   Seasonal allergies    Shortness of breath dyspnea    Type II diabetes mellitus ([place])    Dr Loanne Drilling   Past Surgical History:  Procedure Laterality Date   A FLUTTER ABLATION     Dr Lovena Le   ABDOMINAL AORTOGRAM W/LOWER EXTREMITY Bilateral 04/11/2020   Procedure: ABDOMINAL AORTOGRAM W/LOWER EXTREMITY;  Surgeon: Angelia Mould, MD;  Location: [facility];  Service:  Cardiovascular;  Laterality: Bilateral;   CATARACT EXTRACTION W/ INTRAOCULAR LENS  IMPLANT, BILATERAL Bilateral    CHOLECYSTECTOMY     COLONOSCOPY  6/07   2 polyps Dr. Kinnie Feil in [place]   colonoscopy with polypectomy      X 2; [facility]   CORNEAL TRANSPLANT Bilateral    CORONARY ANGIOPLASTY WITH STENT PLACEMENT  02/2007   BMS w/Dr Lovena Le   CORONARY ATHERECTOMY N/A 05/16/2020   Procedure: CORONARY ATHERECTOMY;  Surgeon: Martinique, Peter M, MD;  Location: [facility];  Service: Cardiovascular;  Laterality: N/A;   CORONARY BALLOON ANGIOPLASTY N/A 05/16/2020   Procedure: CORONARY BALLOON ANGIOPLASTY;  Surgeon: Martinique, Peter M, MD;  Location: [facility];  Service:  Cardiovascular;  Laterality: N/A;   CORONARY STENT INTERVENTION N/A 05/16/2020   Procedure: CORONARY STENT INTERVENTION;  Surgeon: Martinique, Peter M, MD;  Location: [facility];  Service: Cardiovascular;  Laterality: N/A;   EYE SURGERY     gravid 2 para 2     INTRAVASCULAR ULTRASOUND/IVUS N/A 05/16/2020   Procedure: Intravascular Ultrasound/IVUS;  Surgeon: Martinique, Peter M, MD;  Location: [facility];  Service: Cardiovascular;  Laterality: N/A;   KNEE CARTILAGE SURGERY Right    LEFT AND RIGHT HEART CATHETERIZATION WITH CORONARY ANGIOGRAM N/A 05/07/2014   Procedure: LEFT AND RIGHT HEART CATHETERIZATION WITH CORONARY ANGIOGRAM;  Surgeon: Peter M Martinique, MD;  [facility];  Service: Cardiovascular;  Laterality: N/A;   PERIPHERAL VASCULAR INTERVENTION  04/11/2020   Procedure: PERIPHERAL VASCULAR INTERVENTION;  Surgeon: Angelia Mould, MD;  Location: [facility];  Service: Cardiovascular;;   RIGHT HEART CATH N/A 02/04/2021   Procedure: RIGHT HEART CATH;  Surgeon: Jolaine Artist, MD;  Location: [facility];  Service: Cardiovascular;  Laterality: N/A;   RIGHT/LEFT HEART CATH AND CORONARY ANGIOGRAPHY N/A 05/16/2020   Procedure: RIGHT/LEFT HEART CATH AND CORONARY ANGIOGRAPHY;  Surgeon: Jolaine Artist, MD;  Location: [facility];  Service: Cardiovascular;  Laterality: N/A;   TOTAL KNEE ARTHROPLASTY Right 06/28/2016   Procedure: TOTAL KNEE ARTHROPLASTY;  Surgeon: Frederik Pear, MD;  Location: [facility];  Service: Orthopedics;  Laterality: Right;   Family History  Problem Relation Age of Onset   Diabetes Mother    Hypertension Mother    Transient ischemic attack Mother    Arthritis Mother    Heart attack Father 29   Arthritis Father    Hypertension Father    Breast cancer Maternal Aunt    Arthritis Maternal Aunt    Social History   Socioeconomic History   Marital status: Widowed    Spouse name: Not on file   Number of children: 2   Years of education: 16   Highest education level: Bachelor's degree (e.g., BA, AB, BS)  Occupational History   Occupation: Teacher/ Retired  Tobacco Use   Smoking status: Former    Packs/day: 0.25    Years: 18.00    Total pack years: 4.50    Types: Cigarettes    Quit date: 12/27/1968    Years since quitting: 53.7   Smokeless tobacco: Never   Tobacco comments:    smoked [place], up to 1 pp week  Vaping Use   Vaping Use: Never used  Substance and Sexual Activity   Alcohol use: Yes    Comment: occ   Drug use: No   Sexual activity: Never  Other Topics Concern   Not on file  Social History Narrative   Teacher. Widowed. Rarely drinks cafeine.    Social Determinants of Health   Financial Resource Strain: Low Risk  (09/06/2022)   Overall Financial Resource Strain (CARDIA)    Difficulty of Paying Living Expenses: Not hard at all  Food Insecurity: No Food Insecurity (09/06/2022)   Hunger Vital Sign    Worried About Running Out of Food in the Last Year: Never true    Ran Out of Food in the Last Year: Never true  Transportation Needs: No Transportation Needs (09/06/2022)   PRAPARE - Hydrologist (Medical): No    Lack of Transportation (Non-Medical): No  Physical Activity: Sufficiently Active (09/06/2022)    Exercise Vital Sign    Days of  Exercise per Week: 5 days    Minutes of Exercise per Session: 30 min  Stress: No Stress Concern Present (09/06/2022)   [place]    Feeling of Stress : Only a little  Social Connections: Socially Integrated (09/06/2022)   Social Connection and Isolation Panel [NHANES]    Frequency of Communication with Friends and Family: More than three times a week    Frequency of Social Gatherings with Friends and Family: More than three times a week    Attends Religious Services: More than 4 times per year    Active Member of [employer] or Organizations: Yes    Attends Music therapist: More than 4 times per year    Marital Status: Married    Tobacco Counseling Counseling given: Not Answered Tobacco comments: smoked [place], up to 1 pp week   Clinical Intake:  Pre-visit preparation completed: Yes  Pain : No/denies pain Pain Score: 0-No pain     BMI - recorded: 27.81 Nutritional Status: BMI 25 -29 Overweight Nutritional Risks: None Diabetes: Yes CBG done?: Yes (120) CBG resulted in Enter/ Edit results?: Yes Did pt. bring in CBG monitor from home?: No  How often do you need to have someone help you when you read instructions, pamphlets, or other written materials from your doctor or pharmacy?: 1 - Never What is the last grade level you completed in school?: HSG; bachelor's degree  Diabetic?yes  Interpreter Needed?: No  Information entered by :: Lisette Abu, LPN.   Activities of Daily Living    09/06/2022    9:37 AM  In your present state of health, do you have any difficulty performing the following activities:  Hearing? 0  Vision? 0  Difficulty concentrating or making decisions? 0  Walking or climbing stairs? 0  Dressing or bathing? 0  Doing errands, shopping? 0  Preparing Food and eating ? N  Using the Toilet? N  In the past six months, have you accidently  leaked urine? N  Do you have problems with loss of bowel control? N  Managing your Medications? N  Managing your Finances? N  Housekeeping or managing your Housekeeping? N    Patient Care Team: [name], MD as PCP - General (Internal Medicine) Debara Pickett Nadean Corwin, MD as PCP - Cardiology (Cardiology) Bensimhon, Shaune Pascal, MD as PCP - Advanced Heart Failure (Cardiology) Chesley Mires, MD as Consulting Physician (Pulmonary Disease) Renato Shin, MD (Inactive) as Consulting Physician (Endocrinology) Bensimhon, Shaune Pascal, MD as Consulting Physician (Cardiology) Charlton Haws, [facility] as Pharmacist (Pharmacist) Drake Leach, [place] (Optometry)  Indicate any recent Medical Services you may have received from other than [facility] providers in the past year (date may be approximate).     Assessment:   This is a routine wellness examination for [place].  Hearing/Vision screen Hearing Screening - Comments:: No hearing aids. Vision Screening - Comments:: Wears rx glasses - up to date with routine eye exams with Dr. Beatrix Fetters   Dietary issues and exercise activities discussed: Current Exercise Habits: Home exercise routine, Type of exercise: walking;treadmill;stretching;strength training/weights;exercise ball;calisthenics, Time (Minutes): 30, Frequency (Times/Week): 5, Weekly Exercise (Minutes/Week): 150, Intensity: Moderate, Exercise limited by: respiratory conditions(s);cardiac condition(s)   Goals Addressed   None   Depression Screen    09/06/2022    9:28 AM 03/29/2022    9:00 AM 11/27/2021    9:00 AM 09/03/2021    9:51 AM 02/17/2021    3:45 PM 09/19/2020  1:40 PM 07/17/2020   12:39 PM  PHQ 2/9 Scores  PHQ - 2 Score 0 1 0 0 1 0 0    Fall Risk    09/06/2022    9:33 AM 08/02/2022    9:45 AM 03/29/2022    9:00 AM 01/01/2022    9:00 AM 11/27/2021    9:00 AM  Fall Risk   Falls in the past year? 0 0 0 0 0  Comment  continues to deny falls x last 12 months; uses cane/ walker as/ if indicated/  needed     Number falls in past yr: 0 0 0 0 0  Injury with Fall? 0 0 0 0 0  Comment  N/A- denies falls N/A- no falls reported x 12 months N/A- no falls reported N/A- no falls reported  Risk for fall due to : No Fall Risks;History of fall(s);Impaired balance/gait History of fall(s);Impaired balance/gait;Impaired mobility;Medication side effect Impaired mobility;Medication side effect Impaired mobility Impaired mobility;Medication side effect  Risk for fall due to: Comment    uses walker all the time   Follow up Falls prevention discussed;Falls evaluation completed Falls prevention discussed Falls prevention discussed Falls prevention discussed Falls prevention discussed    FALL RISK PREVENTION PERTAINING TO THE HOME:  Any stairs in or around the home? Yes  If so, are there any without handrails? Yes  Home free of loose throw rugs in walkways, pet beds, electrical cords, etc? No  Adequate lighting in your home to reduce risk of falls? No   ASSISTIVE DEVICES UTILIZED TO PREVENT FALLS:  Life alert? Yes  Use of a cane, walker or w/c? Yes  Grab bars in the bathroom? Yes  Shower chair or bench in shower? Yes  Elevated toilet seat or a handicapped toilet? No   TIMED UP AND GO:  Was the test performed? No .  Length of time to ambulate 10 feet: n/a sec.   Appearance of gait: Gait not evaluated during this visit.  Cognitive Function:    10/12/2019   12:44 PM 07/31/2018    9:39 AM  MMSE - Mini Mental State Exam  Not completed: Refused   Orientation to time  5  Orientation to Place  5  Registration  3  Attention/ Calculation  5  Recall  2  Language- name 2 objects  2  Language- repeat  1  Language- follow 3 step command  3  Language- read & follow direction  1  Write a sentence  1  Copy design  1  Total score  29        09/06/2022    9:38 AM  6CIT Screen  What Year? 0 points  What month? 0 points  What time? 0 points  Count back from 20 0 points  Months in reverse 0 points   Repeat phrase 0 points  Total Score 0 points    Immunizations Immunization History  Administered Date(s) Administered   Fluad Quad(high Dose 65+) 08/22/2019, 10/07/2021   Influenza Split 11/16/2011, 09/26/2012   Influenza Whole 12/27/2004, 10/21/2008, 10/20/2009, 11/11/2010   Influenza, High Dose Seasonal PF 10/14/2016, 10/10/2017, 09/28/2018   Influenza,inj,Quad PF,6+ Mos 09/24/2013, 12/16/2014, 09/19/2015   Moderna SARS-COV2 Booster Vaccination 11/03/2020   Moderna Sars-Covid-2 Vaccination 02/08/2020, 03/10/2020   PPD Test 05/31/2015, 07/07/2016   Pneumococcal Conjugate-13 07/31/2018   Pneumococcal Polysaccharide-23 12/16/2014   Pneumococcal-Unspecified 05/31/2015   Td 06/15/2010    TDAP status: Due, Education has been provided regarding the importance of this vaccine. Advised may receive this  vaccine at local pharmacy or Health Dept. Aware to provide a copy of the vaccination record if obtained from local pharmacy or Health Dept. Verbalized acceptance and understanding.  Flu Vaccine status: Due, Education has been provided regarding the importance of this vaccine. Advised may receive this vaccine at local pharmacy or Health Dept. Aware to provide a copy of the vaccination record if obtained from local pharmacy or Health Dept. Verbalized acceptance and understanding.  Pneumococcal vaccine status: Up to date  Covid-19 vaccine status: Completed vaccines  Qualifies for Shingles Vaccine? Yes   Zostavax completed No   Shingrix Completed?: No.    Education has been provided regarding the importance of this vaccine. Patient has been advised to call insurance company to determine out of pocket expense if they have not yet received this vaccine. Advised may also receive vaccine at local pharmacy or Health Dept. Verbalized acceptance and understanding.  Screening Tests Health Maintenance  Topic Date Due   Zoster Vaccines- Shingrix (1 of 2) Never done   DEXA SCAN  02/16/2019    TETANUS/TDAP  06/15/2020   COVID-19 Vaccine (3 - Moderna risk series) 12/01/2020   INFLUENZA VACCINE  07/27/2022   OPHTHALMOLOGY EXAM  11/16/2022   HEMOGLOBIN A1C  01/29/2023   FOOT EXAM  07/30/2023   Pneumonia Vaccine 36+ Years old  Completed   HPV VACCINES  Aged Out    Health Maintenance  Health Maintenance Due  Topic Date Due   Zoster Vaccines- Shingrix (1 of 2) Never done   DEXA SCAN  02/16/2019   TETANUS/TDAP  06/15/2020   COVID-19 Vaccine (3 - Moderna risk series) 12/01/2020   INFLUENZA VACCINE  07/27/2022    Colorectal cancer screening: No longer required.   Mammogram status: No longer required due to age.  Bone density status: No longer required due to age.  Lung Cancer Screening: (Low Dose CT Chest recommended if Age 82-80 years, 30 pack-year currently smoking OR have quit w/in 15years.) does not qualify.   Lung Cancer Screening Referral: no  Additional Screening:  Hepatitis C Screening: does not qualify; Completed no  Vision Screening: Recommended annual ophthalmology exams for early detection of glaucoma and other disorders of the eye. Is the patient up to date with their annual eye exam?  Yes  Who is the provider or what is the name of the office in which the patient attends annual eye exams? [employer], OD. If pt is not established with a provider, would they like to be referred to a provider to establish care? No .   Dental Screening: Recommended annual dental exams for proper oral hygiene  Community Resource Referral / Chronic Care Management: CRR required this visit?  No   CCM required this visit?  No      Plan:     I have personally reviewed and noted the following in the patient's chart:   Medical and social history Use of alcohol, tobacco or illicit drugs  Current medications and supplements including opioid prescriptions. Patient is not currently taking opioid prescriptions. Functional ability and status Nutritional status Physical  activity Advanced directives List of other physicians Hospitalizations, surgeries, and ER visits in previous 12 months Vitals Screenings to include cognitive, depression, and falls Referrals and appointments  In addition, I have reviewed and discussed with patient certain preventive protocols, quality metrics, and best practice recommendations. A written personalized care plan for preventive services as well as general preventive health recommendations were provided to patient.     [name] Flow, LPN  09/06/2022   Nurse Notes:  Patient is cogitatively intact. Patient provided vital signs, CBG and BP reading for this visit.

## 2022-09-07 ENCOUNTER — Encounter: Payer: Self-pay | Admitting: Internal Medicine

## 2022-09-08 MED ORDER — AZITHROMYCIN 500 MG PO TABS
500.0000 mg | ORAL_TABLET | Freq: Once | ORAL | 0 refills | Status: DC
Start: 2022-09-08 — End: 2023-03-23

## 2022-09-16 ENCOUNTER — Other Ambulatory Visit: Payer: Self-pay

## 2022-09-16 MED ORDER — ONETOUCH VERIO W/DEVICE KIT: PACK | 0 refills | Status: DC

## 2022-09-29 ENCOUNTER — Encounter: Payer: Self-pay | Admitting: Internal Medicine

## 2022-10-01 ENCOUNTER — Ambulatory Visit: Payer: Medicare Other

## 2022-10-11 ENCOUNTER — Telehealth (HOSPITAL_COMMUNITY): Payer: Self-pay | Admitting: Pharmacy Technician

## 2022-10-11 ENCOUNTER — Encounter (HOSPITAL_COMMUNITY): Payer: Self-pay

## 2022-10-11 NOTE — Telephone Encounter (Signed)
Advanced Heart Failure Patient Advocate Encounter  Patient called and requested a month of Eliquis samples, sent Emer (RN) the request. Called and lvm confirming that samples would be provided.  Charlann Boxer, CPhT

## 2022-10-11 NOTE — Progress Notes (Unsigned)
Medication Samples have been provided to the patient.  Drug name: Eliquis       Strength: '5mg'$         Qty: 4 boxes  LOT: [id number]  Exp.Date: 04/25  Dosing instructions: take 1 tablet Twice daily   The patient has been instructed regarding the correct time, dose, and frequency of taking this medication, including desired effects and most common side effects.   Shiraz Bastyr Jerilynn Mages Cono Gebhard 12:23 PM 10/11/2022

## 2022-10-27 ENCOUNTER — Encounter: Payer: Self-pay | Admitting: Internal Medicine

## 2022-11-10 ENCOUNTER — Encounter (HOSPITAL_BASED_OUTPATIENT_CLINIC_OR_DEPARTMENT_OTHER): Payer: Self-pay | Admitting: Pulmonary Disease

## 2022-11-28 ENCOUNTER — Encounter: Payer: Self-pay | Admitting: Internal Medicine

## 2022-12-03 ENCOUNTER — Ambulatory Visit: Payer: Medicare Other | Admitting: Internal Medicine

## 2022-12-07 ENCOUNTER — Encounter: Payer: Self-pay | Admitting: Internal Medicine

## 2022-12-07 ENCOUNTER — Telehealth: Payer: Self-pay

## 2022-12-07 ENCOUNTER — Ambulatory Visit: Payer: Medicare Other | Admitting: Internal Medicine

## 2022-12-07 NOTE — Progress Notes (Deleted)
Patient ID: [name], female   DOB: [birth year], 88 y.o.   MRN: [id number]  HPI: [name] is a 88 y.o.-year-old female, self-referred (her daughter is also my patient), for management of DM2, dx in 1994, insulin-dependent, uncontrolled, with complications (CAD, s/p NSTEMI 2008; CHF; PAD; atrial fibrillation/atrial flutter; peripheral neuropathy; CKD stage IIIa).   Last visit with me 4 months ago.  Patient's daughter accompanies her today and offers information about patient's diet, activity, and insulin doses.  Interim history: No increased urination, blurry vision, nausea, chest pain.  She continues to have fatigue. At last visit, she fell of the wagon with diet, blood sugar checks and medication doses as she is very stressed with her son having recurrent cancer. Since last visit, she developed a blister on her toe.  Reviewed HbA1c: Lab Results  Component Value Date   HGBA1C 7.0 (A) 07/29/2022   HGBA1C 8.4 (A) 03/26/2022   HGBA1C 7.3 (A) 12/25/2021   HGBA1C 9.0 (A) 09/14/2021   HGBA1C 8.3 (A) 05/28/2021   HGBA1C 8.0 (A) 03/24/2021   HGBA1C 9.5 (A) 12/02/2020   HGBA1C 7.8 (A) 08/21/2020   HGBA1C 8.3 (H) 05/15/2020   HGBA1C 8.0 (A) 05/06/2020   HGBA1C 9.2 (H) 02/20/2020   HGBA1C 7.9 (A) 11/14/2019   HGBA1C 7.6 (A) 08/22/2019   HGBA1C 8.1 (A) 05/01/2019   HGBA1C 8.9 (H) 01/31/2019   HGBA1C 7.0 (A) 12/06/2018   HGBA1C 6.5 (A) 09/20/2018   HGBA1C 6.4 (A) 07/19/2018   HGBA1C 7.3 05/08/2018   HGBA1C 8.0 (H) 01/30/2018   HGBA1C 8.3 11/23/2017   HGBA1C 7.4 08/23/2017   HGBA1C 8.2 01/24/2017   HGBA1C 7.7 09/08/2016   HGBA1C 8.4 (H) 06/09/2016   HGBA1C 7.8 (H) 03/04/2016   HGBA1C 7.6 12/08/2015   HGBA1C 8.3 08/26/2015   HGBA1C 7.9 (H) 05/24/2015   HGBA1C 8.6 (H) 02/06/2015   HGBA1C 8.7 (H) 11/15/2014   HGBA1C 8.1 (H) 08/21/2014   HGBA1C 9.6 (H) 01/21/2014   HGBA1C 9.8 (H) 09/13/2013   HGBA1C 9.7 (H) 05/15/2013   HGBA1C 10.9 (H) 02/01/2013   HGBA1C 8.9 (H)  09/26/2012   HGBA1C 9.9 (H) 05/29/2012   HGBA1C 8.1 (H) 09/06/2011   HGBA1C 8.3 (H) 07/13/2011   Pt is on a regimen of: - Humalog 75/25 >> 70/30 Novolin (24$ at [facility], w/o Rx) 32 units before breakfast and 6 units before dinner >> 28-30 units before breakfast >> 12 units 2x a day before meals >> 16 units in the a.m. and 12 units before dinner - Ozempic 0.25 >> 0.5 mg weekly-added 08/2021 >> stopped 12/2021 2/2 $$ but now new insurance >> restarted (patient assistance)  Pt checks her sugars 2-4x a day and they are: - am: 108-151, 165 >> 104-184, 215 >> 89-158, 164, 186 - 2h after b'fast: n/c >> 154 >> 172 >> n/c - before lunch: 229, 362 >> 125-194, 220 >> 248 >> 189 >> 79 - 2h after lunch: n/c  >> 148 - before dinner:82-150, 182, 258 >> 129, 144 >> 78-118, 178. 199  - 2h after dinner: 134, 223-230 >> 70, 159-187 >> 420 >> 171, 198 - bedtime: 115-196, 233 >> 79, 110-149, 239 >> 61, 100, 181 - nighttime: n/c >> 93-227 >> 186-286 >>  Lowest sugar was 64 >> 51 >> 104>> 59 ; she has hypoglycemia awareness at 70.  Highest sugar was 270 >> 260 >> 420.  Glucometer: One Touch Verio IQ  Pt's meals are: - Breakfast: frozen banana, berries, oatmeal, protein powder >>  yogurt and fruit - dinner: Includes soup  But she has lunch and dinner in the cafeteria ([facility] facility: [place]):   -+ History of CKD stage IIIa, last BUN/creatinine:  Lab Results  Component Value Date   BUN 31 (H) 12/17/2021   BUN 16 02/05/2021   CREATININE 1.24 (H) 12/17/2021   CREATININE 0.84 02/05/2021  She is not on an ACE inhibitor/ARB  -+ HL; last set of lipids: Lab Results  Component Value Date   CHOL 164 05/16/2020   HDL 50 05/16/2020   LDLCALC 96 05/16/2020   LDLDIRECT 144.6 02/03/2010   TRIG 90 05/16/2020   CHOLHDL 3.3 05/16/2020  She had muscle weakness from statins.   - last eye exam was on 11/16/2021: No DR  - no numbness and tingling in her feet.  She saw podiatry, but  not recently. Last foot exam 07/29/2022.  Pt has FH of DM in mother.  She also has a history of HTN, osteoporosis, protein-calorie malnutrition, iron deficiency anemia.  No FH of MTC, no pancreatitis hx.  ROS: + See HPI  Past Medical History:  Diagnosis Date   Anemia    iron defficiency   Anginal pain ([place])    Arthritis    "knees, feet, hands; joints" (05/27/2015)   Asthma    Atrial fibrillation or flutter    s/p RFCA 7/08;   s/p DCCV in past;   previously on amiodarone;  amio stopped due to lung toxicity   CAD (coronary artery disease)    s/p NSTEMI tx with BMS to OM1 3/08;  cath 3/08: pOM 99% tx with PCI, pLAD 20%, ? mod stenosis at the AM   CAP (community acquired pneumonia) 05/24/2015   CHF (congestive heart failure) ([place])    Chronic diastolic heart failure ([place])    echo 11/11:  EF 55-60%, severe LVH, mod LAE, mild MR, mildly increased PASP   COPD (chronic obstructive pulmonary disease) (H[facility])    Degenerative joint disease    Dystrophy, corneal stromal    Heart murmur    HLD (hyperlipidemia)    HTN (hypertension)    essential nos   Hypopotassemia    PMH of   Muscle pain    Myocardial infarction ([place]) 2008   OSA on CPAP    Osteoporosis    Pneumonia 05/27/2015   Protein calorie malnutrition (H[facility])    Rash and nonspecific skin eruption    both arms,awaiting bio   dr Delman Cheadle   Seasonal allergies    Shortness of breath dyspnea    Type II diabetes mellitus ([place])    Dr Loanne Drilling   Past Surgical History:  Procedure Laterality Date   A FLUTTER ABLATION     Dr Lovena Le   ABDOMINAL AORTOGRAM W/LOWER EXTREMITY Bilateral 04/11/2020   Procedure: ABDOMINAL AORTOGRAM W/LOWER EXTREMITY;  Surgeon: Angelia Mould, MD;  Location: [facility];  Service: Cardiovascular;  Laterality: Bilateral;   CATARACT EXTRACTION W/ INTRAOCULAR LENS  IMPLANT, BILATERAL Bilateral    CHOLECYSTECTOMY     COLONOSCOPY  6/07   2 polyps Dr. Kinnie Feil in [place]   colonoscopy with polypectomy      X 2;  [facility]   CORNEAL TRANSPLANT Bilateral    CORONARY ANGIOPLASTY WITH STENT PLACEMENT  02/2007   BMS w/Dr Lovena Le   CORONARY ATHERECTOMY N/A 05/16/2020   Procedure: CORONARY ATHERECTOMY;  Surgeon: Martinique, Peter M, MD;  Location: [facility];  Service: Cardiovascular;  Laterality: N/A;   CORONARY BALLOON ANGIOPLASTY N/A  05/16/2020   Procedure: CORONARY BALLOON ANGIOPLASTY;  Surgeon: Martinique, Peter M, MD;  Location: [facility];  Service: Cardiovascular;  Laterality: N/A;   CORONARY STENT INTERVENTION N/A 05/16/2020   Procedure: CORONARY STENT INTERVENTION;  Surgeon: Martinique, Peter M, MD;  Location: [facility];  Service: Cardiovascular;  Laterality: N/A;   EYE SURGERY     gravid 2 para 2     INTRAVASCULAR ULTRASOUND/IVUS N/A 05/16/2020   Procedure: Intravascular Ultrasound/IVUS;  Surgeon: Martinique, Peter M, MD;  Location: [facility];  Service: Cardiovascular;  Laterality: N/A;   KNEE CARTILAGE SURGERY Right    LEFT AND RIGHT HEART CATHETERIZATION WITH CORONARY ANGIOGRAM N/A 05/07/2014   Procedure: LEFT AND RIGHT HEART CATHETERIZATION WITH CORONARY ANGIOGRAM;  Surgeon: Peter M Martinique, MD;  Location[facility];  Service: Cardiovascular;  Laterality: N/A;   PERIPHERAL VASCULAR INTERVENTION  04/11/2020   Procedure: PERIPHERAL VASCULAR INTERVENTION;  Surgeon: Angelia Mould, MD;  Location: [facility];  Service: Cardiovascular;;   RIGHT HEART CATH N/A 02/04/2021   Procedure: RIGHT HEART CATH;  Surgeon: Jolaine Artist, MD;  Location: [facility];  Service: Cardiovascular;  Laterality: N/A;   RIGHT/LEFT HEART CATH AND CORONARY ANGIOGRAPHY N/A 05/16/2020   Procedure: RIGHT/LEFT HEART CATH AND CORONARY ANGIOGRAPHY;  Surgeon: Jolaine Artist, MD;  Location: [facility];  Service: Cardiovascular;  Laterality: N/A;   TOTAL KNEE ARTHROPLASTY Right 06/28/2016   Procedure: TOTAL KNEE ARTHROPLASTY;  Surgeon: Frederik Pear, MD;  Location: [facility];  Service:  Orthopedics;  Laterality: Right;   Social History   Socioeconomic History   Marital status: Widowed    Spouse name: Not on file   Number of children: 2   Years of education: 16   Highest education level: Bachelor's degree (e.g., BA, AB, BS)  Occupational History   Occupation: Teacher/ Retired  Tobacco Use   Smoking status: Former    Packs/day: 0.25    Years: 18.00    Total pack years: 4.50    Types: Cigarettes    Quit date: 12/27/1968    Years since quitting: 53.9   Smokeless tobacco: Never   Tobacco comments:    smoked [place], up to 1 pp week  Vaping Use   Vaping Use: Never used  Substance and Sexual Activity   Alcohol use: Yes    Comment: occ   Drug use: No   Sexual activity: Never  Other Topics Concern   Not on file  Social History Narrative   Teacher. Widowed. Rarely drinks cafeine.    Social Determinants of Health   Financial Resource Strain: Low Risk  (09/06/2022)   Overall Financial Resource Strain (CARDIA)    Difficulty of Paying Living Expenses: Not hard at all  Food Insecurity: No Food Insecurity (09/06/2022)   Hunger Vital Sign    Worried About Running Out of Food in the Last Year: Never true    Ran Out of Food in the Last Year: Never true  Transportation Needs: No Transportation Needs (09/06/2022)   PRAPARE - Hydrologist (Medical): No    Lack of Transportation (Non-Medical): No  Physical Activity: Sufficiently Active (09/06/2022)   Exercise Vital Sign    Days of Exercise per Week: 5 days    Minutes of Exercise per Session: 30 min  Stress: No Stress Concern Present (09/06/2022)   [place]    Feeling of Stress : Only  a little  Social Connections: Socially Integrated (09/06/2022)   Social Connection and Isolation Panel [NHANES]    Frequency of Communication with Friends and Family: More than three times a week    Frequency of Social Gatherings with Friends  and Family: More than three times a week    Attends Religious Services: More than 4 times per year    Active Member of [employer] or Organizations: Yes    Attends Music therapist: More than 4 times per year    Marital Status: Married  Human resources officer Violence: Not At Risk (09/06/2022)   Humiliation, Afraid, Rape, and Kick questionnaire    Fear of Current or Ex-Partner: No    Emotionally Abused: No    Physically Abused: No    Sexually Abused: No   Current Outpatient Medications on File Prior to Visit  Medication Sig Dispense Refill   acetaminophen (TYLENOL) 325 MG tablet Take 2 tablets (650 mg total) by mouth every 6 (six) hours as needed for mild pain or headache. 20 tablet 0   amLODipine (NORVASC) 5 MG tablet Take 1 tablet (5 mg total) by mouth daily. 90 tablet 3   Azelastine HCl (ASTEPRO) 0.15 % SOLN Place 1 spray into the nose daily as needed (Allergies).     Blood Glucose Monitoring Suppl (ONETOUCH VERIO) w/Device KIT Use as instructed to check blood sugar 1 kit 0   carvedilol (COREG) 6.25 MG tablet TAKE 1 TABLET BY MOUTH  TWICE DAILY WITH MEALS 180 tablet 3   ELIQUIS 5 MG TABS tablet TAKE 1 TABLET BY MOUTH  TWICE DAILY 60 tablet 11   fluticasone (FLONASE) 50 MCG/ACT nasal spray Place 1 spray into both nostrils daily as needed for allergies or rhinitis. 16 g 2   insulin aspart protamine- aspart (NOVOLOG MIX 70/30) (70-30) 100 UNIT/ML injection Inject 14 Units into the skin daily with breakfast. 12 units in the PM     Lancet Devices (LANCING DEVICE) MISC Use as advised - for Delica Lancets 1 each 0   Lancets (ONETOUCH DELICA PLUS [id number]) MISC USE TO MONITOR GLUCOSE  LEVELS TWICE DAILY 200 each 3   Multiple Vitamins-Minerals (CENTRUM SILVER ULTRA WOMENS PO) Take 1 tablet by mouth daily.     nitroGLYCERIN (NITROSTAT) 0.4 MG SL tablet Place 1 tablet (0.4 mg total) under the tongue every 5 (five) minutes as needed for chest pain. Reported on 01/28/2016 30 tablet 3   NOVOFINE PLUS  PEN NEEDLE 32G X 4 MM MISC SMARTSIG:1 Each SUB-Q Daily 200 each 1   ONETOUCH VERIO test strip CHECK BLOOD SUGAR TWICE  DAILY 200 strip 3   OXYGEN Inhale 3 Doses into the lungs as needed. 3 liters     potassium chloride SA (KLOR-CON M) 20 MEQ tablet TAKE 2 TABLETS BY MOUTH  DAILY 180 tablet 3   Semaglutide,0.25 or 0.5MG/DOS, (OZEMPIC, 0.25 OR 0.5 MG/DOSE,) 2 MG/1.5ML SOPN Inject 0.5 mg into the skin once a week. 4.5 mL 3   torsemide (DEMADEX) 20 MG tablet TAKE 2 TABLETS BY MOUTH  TWICE DAILY 360 tablet 3   No current facility-administered medications on file prior to visit.   Allergies  Allergen Reactions   Levaquin [Levofloxacin] Shortness Of Breath and Swelling    angioedema   Lobster [Shellfish Allergy] Other (See Comments)    angioedema   Penicillins Rash    Has patient had a PCN reaction causing immediate rash, facial/tongue/throat swelling, SOB or lightheadedness with hypotension: Yes Has patient had a PCN reaction causing severe  rash involving mucus membranes or skin necrosis: No Has patient had a PCN reaction that required hospitalization: No Has patient had a PCN reaction occurring within the last 10 years: No If all of the above answers are "NO", then may proceed with Cephalosporin use.   Valsartan Other (See Comments)    REACTION: angioedema   Codeine Other (See Comments)    Mental status changes   Lipitor [Atorvastatin] Other (See Comments)    weakness   Oxycodone Other (See Comments)    hallucinations   Statins     Made her too weak   Tramadol    Amlodipine Besylate Other (See Comments)    REACTION: tingling in lips & gum edema 7/12: talked to patient, states she is tolerating well   Crestor [Rosuvastatin Calcium] Rash   Tramadol Hcl Nausea And Vomiting   Family History  Problem Relation Age of Onset   Diabetes Mother    Hypertension Mother    Transient ischemic attack Mother    Arthritis Mother    Heart attack Father 80   Arthritis Father    Hypertension  Father    Breast cancer Maternal Aunt    Arthritis Maternal Aunt    PE: There were no vitals taken for this visit. Wt Readings from Last 3 Encounters:  09/06/22 157 lb (71.2 kg)  08/04/22 159 lb 6.4 oz (72.3 kg)  07/29/22 158 lb (71.7 kg)   Constitutional: Slightly overweight, in NAD Eyes: EOMI, no exophthalmos ENT: no thyromegaly, no cervical lymphadenopathy Cardiovascular: RRR, No MRG, B pitting LE edema - wears compression hoses Respiratory: CTA B Musculoskeletal: no deformities Skin:  + stasis dermatitis rash - bilateral lower legs Neurological: no tremor with outstretched hands  ASSESSMENT: 1. DM2, insulin-dependent, uncontrolled, with complications - CAD, s/p NSTEMI 2008 - CHF - PAD - h/o R stent - atrial fibrillation/atrial flutter -previously on amiodarone - peripheral neuropathy - CKD stage IIIa  2. Obesity class 1  3. HL  PLAN:  1. Patient with longstanding, uncontrolled, type 2 diabetes, on a premixed insulin regimen to which we added the GLP-1 receptor agonist, with improvement in the blood sugars.  HbA1c at last visit decreased to 7.0%.  At that time, we decreased her 70/30 Novolin insulin doses.  At that time, sugars are mostly at goal with only occasional mild lows.  - I suggested to:  Patient Instructions  Please continue: - Novolin 70/30 14 units 30 min before breakfast and 10-12 units 30 min before dinner - Ozempic 0.5 mg weekly  Please return in 4 months with your sugar log.   - CBG targets for treatment for her are: 80-150 mg/dL before meals and <180-200 mg/dL after meals; target HbA1c <7.4% (due to age). - we checked her HbA1c: 7%  - advised to check sugars at different times of the day - 2x a day, rotating check times - advised for yearly eye exams >> she is UTD - return to clinic in 3-4 months  2.  Obesity class I -At previous visits, I suggested to increase intake of fresh fruit, dried fruit, and other snacks.  I also recommended to try to  switch from smoothies in the morning to oatmeal. We also discussed in the past about stopping protein shakes between meals -She continues on Ozempic which should also help with weight loss -She lost 8 pounds before last visit  3. HL -Reviewed latest lipid panel: Fractions at goal except for a high LDL, above our target of less than 55 due to  history of cardiovascular disease. Lab Results  Component Value Date   CHOL 164 05/16/2020   HDL 50 05/16/2020   LDLCALC 96 05/16/2020   LDLDIRECT 144.6 02/03/2010   TRIG 90 05/16/2020   CHOLHDL 3.3 05/16/2020  -She is not on a statin due to previous muscle weakness she is not on a statin due to previous muscle weakness.  For now, due to age, we decided to continue without a statin.  Philemon Kingdom, MD PhD [facility]

## 2022-12-07 NOTE — Telephone Encounter (Signed)
Pt called and lvm at 10:33 to advise her retirement home transportation never showed up for her appt today. She is requesting a call back to reschedule her appt.

## 2022-12-13 ENCOUNTER — Encounter (HOSPITAL_COMMUNITY): Payer: Self-pay | Admitting: Internal Medicine

## 2022-12-13 ENCOUNTER — Telehealth (HOSPITAL_COMMUNITY): Payer: Self-pay

## 2022-12-13 NOTE — Telephone Encounter (Signed)
Advanced Heart Failure Patient Advocate Encounter  Medication Samples have been left at registration desk for patient pick up. Drug name: Eliquis '5MG'$  Qty: 4x (14 ct) package LOT: [id number] Exp.: 07/25 SIG: Take 1 tablet by mouth twice daily   The patient has been instructed regarding the correct time, dose, and frequency of taking this medication, including desired effects and most common side effects.   Clista Bernhardt, CPhT Rx Patient Advocate Phone: [phone]

## 2022-12-22 ENCOUNTER — Telehealth: Payer: Self-pay

## 2022-12-22 NOTE — Telephone Encounter (Signed)
Pt contacted and advised Patient Assistance Ozempic (5 boxes) was delivered to the office and is ready for pick up. Pt does not have transportation and has given her daughter Shamarie Call permission to pick up her medication on her behalf.

## 2022-12-22 NOTE — Telephone Encounter (Signed)
Patient's daughter, Amaliya Whitelaw, picked up 5 boxes of Ozempic (patient assistance) in office today.

## 2022-12-26 ENCOUNTER — Encounter: Payer: Self-pay | Admitting: Internal Medicine

## 2023-01-01 ENCOUNTER — Encounter (HOSPITAL_COMMUNITY): Payer: Self-pay | Admitting: Internal Medicine

## 2023-01-12 ENCOUNTER — Encounter (HOSPITAL_BASED_OUTPATIENT_CLINIC_OR_DEPARTMENT_OTHER): Payer: Self-pay | Admitting: Pulmonary Disease

## 2023-01-14 ENCOUNTER — Other Ambulatory Visit (HOSPITAL_COMMUNITY): Payer: Self-pay | Admitting: Internal Medicine

## 2023-01-16 ENCOUNTER — Encounter: Payer: Self-pay | Admitting: Internal Medicine

## 2023-01-16 NOTE — Patient Instructions (Addendum)
Blood work was ordered.   The lab is on the first floor.   Have a chest xray today.    Medications changes include :   doxycyline 100 mg twice a day     Return in about 1 year (around 01/19/2024) for Physical Exam.   Health Maintenance, Female Adopting a healthy lifestyle and getting preventive care are important in promoting health and wellness. Ask your health care provider about: The right schedule for you to have regular tests and exams. Things you can do on your own to prevent diseases and keep yourself healthy. What should I know about diet, weight, and exercise? Eat a healthy diet  Eat a diet that includes plenty of vegetables, fruits, low-fat dairy products, and lean protein. Do not eat a lot of foods that are high in solid fats, added sugars, or sodium. Maintain a healthy weight Body mass index (BMI) is used to identify weight problems. It estimates body fat based on height and weight. Your health care provider can help determine your BMI and help you achieve or maintain a healthy weight. Get regular exercise Get regular exercise. This is one of the most important things you can do for your health. Most adults should: Exercise for at least 150 minutes each week. The exercise should increase your heart rate and make you sweat (moderate-intensity exercise). Do strengthening exercises at least twice a week. This is in addition to the moderate-intensity exercise. Spend less time sitting. Even light physical activity can be beneficial. Watch cholesterol and blood lipids Have your blood tested for lipids and cholesterol at 87 years of age, then have this test every 5 years. Have your cholesterol levels checked more often if: Your lipid or cholesterol levels are high. You are older than 87 years of age. You are at high risk for heart disease. What should I know about cancer screening? Depending on your health history and family history, you may need to have cancer  screening at various ages. This may include screening for: Breast cancer. Cervical cancer. Colorectal cancer. Skin cancer. Lung cancer. What should I know about heart disease, diabetes, and high blood pressure? Blood pressure and heart disease High blood pressure causes heart disease and increases the risk of stroke. This is more likely to develop in people who have high blood pressure readings or are overweight. Have your blood pressure checked: Every 3-5 years if you are 37-34 years of age. Every year if you are 86 years old or older. Diabetes Have regular diabetes screenings. This checks your fasting blood sugar level. Have the screening done: Once every three years after age 96 if you are at a normal weight and have a low risk for diabetes. More often and at a younger age if you are overweight or have a high risk for diabetes. What should I know about preventing infection? Hepatitis B If you have a higher risk for hepatitis B, you should be screened for this virus. Talk with your health care provider to find out if you are at risk for hepatitis B infection. Hepatitis C Testing is recommended for: Everyone born from [birth year] through 1963. Anyone with known risk factors for hepatitis C. Sexually transmitted infections (STIs) Get screened for STIs, including gonorrhea and chlamydia, if: You are sexually active and are younger than 87 years of age. You are older than 87 years of age and your health care provider tells you that you are at risk for this type of infection. Your sexual  activity has changed since you were last screened, and you are at increased risk for chlamydia or gonorrhea. Ask your health care provider if you are at risk. Ask your health care provider about whether you are at high risk for HIV. Your health care provider may recommend a prescription medicine to help prevent HIV infection. If you choose to take medicine to prevent HIV, you should first get tested for HIV. You  should then be tested every 3 months for as long as you are taking the medicine. Pregnancy If you are about to stop having your period (premenopausal) and you may become pregnant, seek counseling before you get pregnant. Take 400 to 800 micrograms (mcg) of folic acid every day if you become pregnant. Ask for birth control (contraception) if you want to prevent pregnancy. Osteoporosis and menopause Osteoporosis is a disease in which the bones lose minerals and strength with aging. This can result in bone fractures. If you are 45 years old or older, or if you are at risk for osteoporosis and fractures, ask your health care provider if you should: Be screened for bone loss. Take a calcium or vitamin D supplement to lower your risk of fractures. Be given hormone replacement therapy (HRT) to treat symptoms of menopause. Follow these instructions at home: Alcohol use Do not drink alcohol if: Your health care provider tells you not to drink. You are pregnant, may be pregnant, or are planning to become pregnant. If you drink alcohol: Limit how much you have to: 0-1 drink a day. Know how much alcohol is in your drink. In the [place] one drink equals one 12 oz bottle of beer (355 mL), one 5 oz glass of wine (148 mL), or one 1 oz glass of hard liquor (44 mL). Lifestyle Do not use any products that contain nicotine or tobacco. These products include cigarettes, chewing tobacco, and vaping devices, such as e-cigarettes. If you need help quitting, ask your health care provider. Do not use street drugs. Do not share needles. Ask your health care provider for help if you need support or information about quitting drugs. General instructions Schedule regular health, dental, and eye exams. Stay current with your vaccines. Tell your health care provider if: You often feel depressed. You have ever been abused or do not feel safe at home. Summary Adopting a healthy lifestyle and getting preventive care are  important in promoting health and wellness. Follow your health care provider's instructions about healthy diet, exercising, and getting tested or screened for diseases. Follow your health care provider's instructions on monitoring your cholesterol and blood pressure. This information is not intended to replace advice given to you by your health care provider. Make sure you discuss any questions you have with your health care provider. Document Revised: 05/04/2021 Document Reviewed: 05/04/2021 Elsevier Patient Education  [employer].

## 2023-01-16 NOTE — Progress Notes (Signed)
Subjective:    Patient ID: [name], female    DOB: 10-31-1931, 89 y.o.   MRN: [id number]      HPI Kayzlee is here for a Physical exam.   Has less stamina and less energy.  She has noticed this over the past few months.  The past couple of months - increased mucus - clear - blowing it out and spitting it up.  Sneezing.   Had covid earlier this month.  It was mild.  She feels like she got over it.  She feels sick now and it started 2-3 days ago.  She feels like now she most has bronchitis.  HR and oxygen level dropping - more tired.  Oxygen says 88% or more - usually in 90's    Medications and allergies reviewed with patient and updated if appropriate.  Current Outpatient Medications on File Prior to Visit  Medication Sig Dispense Refill   acetaminophen (TYLENOL) 325 MG tablet Take 2 tablets (650 mg total) by mouth every 6 (six) hours as needed for mild pain or headache. 20 tablet 0   amLODipine (NORVASC) 5 MG tablet Take 1 tablet (5 mg total) by mouth daily. 90 tablet 3   Blood Glucose Monitoring Suppl (ONETOUCH VERIO) w/Device KIT Use as instructed to check blood sugar 1 kit 0   carvedilol (COREG) 6.25 MG tablet TAKE 1 TABLET BY MOUTH TWICE  DAILY WITH MEALS 180 tablet 3   ELIQUIS 5 MG TABS tablet TAKE 1 TABLET BY MOUTH  TWICE DAILY 60 tablet 11   insulin aspart protamine- aspart (NOVOLOG MIX 70/30) (70-30) 100 UNIT/ML injection Inject 14 Units into the skin daily with breakfast. 12 units in the PM     Lancet Devices (LANCING DEVICE) MISC Use as advised - for Delica Lancets 1 each 0   Lancets (ONETOUCH DELICA PLUS MCNOBS96G) MISC USE TO MONITOR GLUCOSE  LEVELS TWICE DAILY 200 each 3   Multiple Vitamins-Minerals (CENTRUM SILVER ULTRA WOMENS PO) Take 1 tablet by mouth daily.     nitroGLYCERIN (NITROSTAT) 0.4 MG SL tablet Place 1 tablet (0.4 mg total) under the tongue every 5 (five) minutes as needed for chest pain. Reported on 01/28/2016 30 tablet 3   NOVOFINE PLUS PEN  NEEDLE 32G X 4 MM MISC SMARTSIG:1 Each SUB-Q Daily 200 each 1   ONETOUCH VERIO test strip CHECK BLOOD SUGAR TWICE  DAILY 200 strip 3   OXYGEN Inhale 3 Doses into the lungs as needed. 3 liters     potassium chloride SA (KLOR-CON M) 20 MEQ tablet TAKE 2 TABLETS BY MOUTH  DAILY 180 tablet 3   Semaglutide,0.25 or 0.'5MG'$ /DOS, (OZEMPIC, 0.25 OR 0.5 MG/DOSE,) 2 MG/1.5ML SOPN Inject 0.5 mg into the skin once a week. 4.5 mL 3   torsemide (DEMADEX) 20 MG tablet TAKE 2 TABLETS BY MOUTH  TWICE DAILY 360 tablet 3   Azelastine HCl (ASTEPRO) 0.15 % SOLN Place 1 spray into the nose daily as needed (Allergies). (Patient not taking: Reported on 01/18/2023)     No current facility-administered medications on file prior to visit.    Review of Systems  Constitutional:  Positive for appetite change (decreased  - does not ear much) and fatigue. Negative for chills and fever.  HENT:  Positive for congestion (clear mucus), hearing loss, postnasal drip and sneezing. Negative for ear discharge, sinus pressure, sinus pain and sore throat.   Respiratory:  Positive for cough (productive clear mucus), shortness of breath and wheezing.   Cardiovascular:  Negative for chest pain, palpitations and leg swelling.  Gastrointestinal:  Positive for abdominal pain (with constipation) and constipation. Negative for blood in stool and nausea.       No gerd  Genitourinary:  Negative for dysuria.  Musculoskeletal:  Positive for arthralgias and back pain.  Skin:  Negative for rash.  Neurological:  Positive for headaches (rare). Negative for light-headedness.  Psychiatric/Behavioral:  Negative for dysphoric mood. The patient is not nervous/anxious.        Objective:   Vitals:   01/18/23 1040 01/18/23 1251  BP: (!) 142/72   Pulse: (!) 41 75  Temp: 98.9 F (37.2 C)   SpO2: 90% 91%   Filed Weights   01/18/23 1040  Weight: 154 lb (69.9 kg)   Body mass index is 27.28 kg/m.  BP Readings from Last 3 Encounters:  01/18/23 (!)  142/72  01/17/23 (!) 158/102  09/06/22 (!) 143/63    Wt Readings from Last 3 Encounters:  01/18/23 154 lb (69.9 kg)  01/17/23 155 lb (70.3 kg)  09/06/22 157 lb (71.2 kg)       Physical Exam Constitutional: She appears well-developed and well-nourished. No distress.  HENT:  Head: Normocephalic and atraumatic.  Right Ear: External ear normal. Normal ear canal with obstructing cerumen-TM not visualized Left Ear: External ear normal.  Normal ear canal with moderate cerumen and TM partially visualized Mouth/Throat: Oropharynx is clear and moist.  Eyes: Conjunctivae normal.  Neck: Neck supple. No tracheal deviation present. No thyromegaly present.  No carotid bruit  Cardiovascular: Normal rate, regular rhythm and normal heart sounds.   2/6 murmur heard.  No edema. Pulmonary/Chest: Effort normal and breath sounds normal. No respiratory distress. She has no wheezes. She has no rales.  Abdominal: Soft. She exhibits no distension. There is no tenderness.  Lymphadenopathy: She has no cervical adenopathy.  Skin: Skin is warm and dry. She is not diaphoretic.  Psychiatric: She has a normal mood and affect. Her behavior is normal.     Lab Results  Component Value Date   WBC 5.2 12/17/2021   HGB 16.2 (H) 12/17/2021   HCT 51.5 (H) 12/17/2021   PLT 179 12/17/2021   GLUCOSE 150 (H) 12/17/2021   CHOL 164 05/16/2020   TRIG 90 05/16/2020   HDL 50 05/16/2020   LDLDIRECT 144.6 02/03/2010   LDLCALC 96 05/16/2020   ALT 18 02/05/2021   AST 20 02/05/2021   NA 140 12/17/2021   K 4.4 12/17/2021   CL 99 12/17/2021   CREATININE 1.24 (H) 12/17/2021   BUN 31 (H) 12/17/2021   CO2 31 12/17/2021   TSH 0.866 05/15/2020   INR 1.16 06/28/2016   HGBA1C 7.0 (A) 07/29/2022   MICROALBUR 11.9 (H) 07/31/2018    DG Chest 2 View CLINICAL DATA:  Cough, shortness of breath.  EXAM: CHEST - 2 VIEW  COMPARISON:  February 05, 2021.  FINDINGS: Stable cardiomediastinal silhouette. Both lungs are clear.  The visualized skeletal structures are unremarkable.  IMPRESSION: No active cardiopulmonary disease.  Aortic Atherosclerosis (ICD10-I70.0).  Electronically Signed   By: Marijo Conception M.D.   On: 01/18/2023 11:58       Assessment & Plan:   Physical exam: Screening blood work  ordered Exercise not regular Weight is good Substance abuse  none   Reviewed recommended immunizations.   Health Maintenance  Topic Date Due   DTaP/Tdap/Td (2 - Tdap) 06/15/2020   OPHTHALMOLOGY EXAM  11/16/2022   COVID-19 Vaccine (4 - 2023-24 season) 02/03/2023 (Originally 11/26/2022)  Zoster Vaccines- Shingrix (1 of 2) 04/19/2023 (Originally 11/11/1950)   HEMOGLOBIN A1C  01/29/2023   FOOT EXAM  07/30/2023   Pneumonia Vaccine 47+ Years old  Completed   INFLUENZA VACCINE  Completed   HPV VACCINES  Aged Out   DEXA SCAN  Discontinued          See Problem List for Assessment and Plan of chronic medical problems.

## 2023-01-17 ENCOUNTER — Encounter: Payer: Self-pay | Admitting: Family Medicine

## 2023-01-17 ENCOUNTER — Non-Acute Institutional Stay: Payer: Medicare Other | Admitting: Family Medicine

## 2023-01-17 VITALS — BP 158/102 | HR 61 | Temp 98.1°F | Resp 18 | Wt 155.0 lb

## 2023-01-17 DIAGNOSIS — R058 Other specified cough: Secondary | ICD-10-CM | POA: Insufficient documentation

## 2023-01-17 DIAGNOSIS — Z515 Encounter for palliative care: Secondary | ICD-10-CM

## 2023-01-17 DIAGNOSIS — I10 Essential (primary) hypertension: Secondary | ICD-10-CM

## 2023-01-17 NOTE — Progress Notes (Signed)
[facility] Pall[facility] Consult Note Telephone: [phone]  Fax: [phone]    Date of encounter: 01/17/23 12:25 PM PATIENT NAME: [name] [address] [place] [id number]   [phone] (home)  DOB: [birth year] MRN: [id number] PRIMARY CARE PROVIDER:    [name], MD,  [place] [address] [phone]  REFERRING PROVIDER:   [name], MD [address] [place],  [place] [address] [phone]  RESPONSIBLE PARTY:    Contact Information     Name Relation Home Work [name] Son [phone]  [phone]   [name], [name] Daughter [phone]  [phone]        I met face to face with patient in [facility] facility. Palliative Care was asked to follow this patient by consultation request of  Burns, [name], MD to address advance care planning and complex medical decision making. This is a follow up visit   ASSESSMENT , SYMPTOM MANAGEMENT AND PLAN / RECOMMENDATIONS:   Post viral cough syndrome Recent recovery from Covid 19 infection May be secondary to sinus congestion/PND versus possible either early 2nd infection or COPD exacerbation. Has PCP follow up for tomorrow-will send update about today's visit Does have some evidence of airway obstruction with increased heart rate with exertion despite beta blockade. Weight is stable, slightly decreased and no significant edema making heart failure exacerbation less likely. Encouraged discussion of symptom management with saline nasal spray and/or flonase with PCP.  2.  Hypertension Elevated BP today 158/102 in right, 153/68 in left with automated cuff. Reports has taken all meds as directed thus far today. Asymptomatic, will follow up with PCP tomorrow-reports previous instruction to increase CCB by 1 tab if SBP >/= 180.  3.  Palliative Care Encounter and Goals of Care discussion Reviewed MOST  form options with patient, completed to her specifications and uploaded to her Epic chart,   Advance Care Planning/Goals of Care: Goals include to maximize quality of life and symptom management. Patient gave her permission to discuss. Our advance care planning conversation included a discussion about:    Experiences with loved ones who have been seriously ill or have died-Her son has gastric cancer which was recently recurrent and has had all the therapy both chemo and radiation that he can have for treatment. She reports he and his wife inspire her but she has difficulty with thoughts that he could potentially pass before her.  She states he is 6' 1 " and originally weighed 200 lbs since he has been unable to eat he has had a feeding tube placed and is down to 80+ pounds. Exploration of personal, cultural or spiritual beliefs that might influence medical decisions-She states that she is not suicidal but is ready to go whenever God calls her home.  When discussing the idea of a feeding tube she replied "for what? I don't want that." Exploration of goals of care in the event of a sudden injury or illness-DNR and discussed options for MOST Identification of a healthcare agent-daughter Vaughn Frieze is her [facility] POA Review and creation of an advance directive document-MOST. Decision not to resuscitate or to de-escalate disease focused treatments due to poor prognosis. CODE STATUS: DNR MOST as of 01/17/2023: DNR/DNI/comfort care Antibiotics/IV fluids on case by case time limited basis No feeding tube  Time spent for [place] discussion:  33 minutes  Follow up Palliative Care Visit: Palliative care will continue to follow for complex medical  decision making, advance care planning, and clarification of goals. Return 4-5 weeks or prn.    This visit was coded based on medical decision making (MDM).  PPS: 70%  HOSPICE ELIGIBILITY/DIAGNOSIS: TBD  Chief Complaint:   Palliative Care is continuing to  follow patient for chronic medical management in setting of chronic diastolic heart failure, pulmonary hypertension and chronic respiratory failure. Palliative Care is also following to assist with  refining and defining goals of care.    HISTORY OF PRESENT ILLNESS:  [name] is a 89 y.o. 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  On 12/26/2022 patient did a home test which was positive for COVID for which she quarantined took aspirin and fluids and increased rest.  In the past 2 days she has developed an "asthma like cough", no fever but increased nasal congestion.  Denies nausea, vomiting or dysuria.  States significant mucus production but is not discolored, has no facial tenderness, headache or tooth pain.  She reports significant mucoid film on her tongue in the mornings requiring wiping multiple times before clearing film.  Trying to qualify for portable O2 to allow more freedom of movement as she is active in plays and wants to be able to spend time away from home but her longest tank is heavy and only lasts 2 hours.  She states she can maintain sats without Oxygen but heart rate increases. She states her O2 level was higher in the 70s-80s.  Does daily weights and titrates her Torsemide accordingly. Blood sugars with HGB A1c 7% on last measurement.  Reports some fasting blood sugars under 120 but has had some up to 150, states she knows when she is getting low because she starts to have diaphoresis and become confused.  She notes having a sweet tooth and often says when she has a fasting of 150-160 it's because she may have had a piece of candy the night before.   She states that she believes she accidentally threw away her partial plate wrapped up in a napkin as she and her family have tried to locate it and cannot.  She reports typically taking her partial out and wrapping it with a Kleenex and putting it off to the side.  She reports being significantly supported and blessed by her family who are very supportive.  She does have a scheduled follow-up with her PCP Dr. Billey Gosling tomorrow.  Advised she might be able to manage symptoms with either nasal saline rinses as needed and or use of Flonase to decrease inflammation and promote drainage.  Advised after using Flonase she would need to rinse her mouth out to decrease the possibility of thrush and this is something she could discuss with Dr. Quay Burow tomorrow.  History obtained from review of EMR, discussion with  [name].      Latest Ref Rng & Units 12/17/2021   12:05 PM 02/05/2021    2:15 AM 02/04/2021    9:13 AM  CBC  WBC 4.0 - 10.5 K/uL 5.2  5.1    Hemoglobin 12.0 - 15.0 g/dL 16.2  14.7  16.0    17.0   Hematocrit 36.0 - 46.0 % 51.5  46.5  47.0    50.0   Platelets 150 - 400 K/uL 179  156  Latest Ref Rng & Units 12/17/2021   12:05 PM 02/05/2021    2:15 AM 02/04/2021    9:13 AM  CMP  Glucose 70 - 99 mg/dL 150  183    BUN 8 - 23 mg/dL 31  16    Creatinine 0.44 - 1.00 mg/dL 1.24  0.84    Sodium 135 - 145 mmol/L 140  139  144    141   Potassium 3.5 - 5.1 mmol/L 4.4  3.7  3.3    3.6   Chloride 98 - 111 mmol/L 99  92    CO2 22 - 32 mmol/L 31  37    Calcium 8.9 - 10.3 mg/dL 10.3  9.7    Total Protein 6.5 - 8.1 g/dL  6.4    Total Bilirubin 0.3 - 1.2 mg/dL  0.9    Alkaline Phos 38 - 126 U/L  78    AST 15 - 41 U/L  20    ALT 0 - 44 U/L  18       5 mo ago (07/29/22) 9 mo ago (03/26/22) 1 yr ago (12/25/21) 1 yr ago (09/14/21) 1 yr ago (05/28/21) 1 yr ago (03/24/21) 2 yr ago (12/02/20)    Hemoglobin A1C 4.0 - 5.6 % 7.0 Abnormal  8.4 Abnormal  7.3 Abnormal  9.0 Abnormal  8.3 Abnormal  8.0  Abnormal  9.5 Abnormal   HbA1c POC (<> result, manual entry)          HbA1c, POC (prediabetic range)          HbA1c, POC (controlled diabetic range)                 Specimen Collected: 07/29/22 14:50 Last Resulted: 07/29/22 14:50      01/31/2019 two 2D echo LVEF 40 to 45% with mild decreased LV function and global hypokinesis.  Moderate LVH.  At that time LV diastolic function could not be evaluated due to A-fib.  RV size is normal and systolic function is mildly reduced. Left atrial size moderately dilated Noted moderate mitral annular calcification with mild mitral regurg and posteriorly directed jet.  There are no significant other valvular findings.  EF had decreased from echo on 05/16/2020 from 50-55   I reviewed EMR for available labs, medications, imaging, studies and related documents.  Records reviewed and summarized above.   ROS General: NAD EYES: denies vision changes ENMT: denies dysphagia. Denied facial pain/pressure, tooth or jaw pain or discolored drainage Cardiovascular: denies chest pain, denies DOE Pulmonary: endorses non-productive dry cough, denies increased SOB Abdomen: endorses good appetite but some decrease from her usual with a desire to lose 5 lbs, denies constipation, endorses continence of bowel GU: denies dysuria, endorses continence of urine MSK:  denies increased weakness,  no falls reported Skin: denies rashes or wounds Neurological: denies pain, denies insomnia Psych: Endorses positive mood Heme/lymph/immuno: denies bruises, abnormal bleeding  Physical Exam: Current and past weights: weight recorded 157 on 09/06/22, today 155 on home scale Constitutional: NAD General: WNWD   ENMT: intact hearing, dentition intact-missing partial CV: S1S2, RRR, trace non-pitting  BLE edema, wearing compression socks Pulmonary: CTAB, diminished in bases, no rales, rhonchi or wheezing, no increased work of breathing, non-productive cough occasionally, intermittently  using room air and O2. Abdomen: normo-active BS + 4 quadrants, soft and non tender, no ascites GU: deferred MSK: no sarcopenia, moves all extremities, ambulatory Skin: warm and dry, no rashes or wounds on visible skin Neuro:  no generalized weakness, no cognitive impairment  Psych: non-anxious affect, A and O x 3 Hem/lymph/immuno: no widespread bruising   Thank you for the opportunity to participate in the care of Ms. Marsalis.  The palliative care team will continue to follow. Please call our office at [phone] if we can be of additional assistance.   [name] Conception, FNP -C  COVID-19 PATIENT SCREENING TOOL Asked and negative response unless otherwise noted:   Have you had symptoms of covid, tested positive or been in contact with someone with symptoms/positive test in the past 5-10 days?  Had Covid 12/26/22.

## 2023-01-18 ENCOUNTER — Ambulatory Visit (INDEPENDENT_AMBULATORY_CARE_PROVIDER_SITE_OTHER): Payer: Medicare Other

## 2023-01-18 ENCOUNTER — Encounter: Payer: Self-pay | Admitting: Internal Medicine

## 2023-01-18 ENCOUNTER — Ambulatory Visit (INDEPENDENT_AMBULATORY_CARE_PROVIDER_SITE_OTHER): Payer: Medicare Other | Admitting: Internal Medicine

## 2023-01-18 VITALS — BP 142/72 | HR 75 | Temp 98.9°F | Ht 63.0 in | Wt 154.0 lb

## 2023-01-18 DIAGNOSIS — I25119 Atherosclerotic heart disease of native coronary artery with unspecified angina pectoris: Secondary | ICD-10-CM | POA: Diagnosis not present

## 2023-01-18 DIAGNOSIS — L821 Other seborrheic keratosis: Secondary | ICD-10-CM | POA: Insufficient documentation

## 2023-01-18 DIAGNOSIS — R052 Subacute cough: Secondary | ICD-10-CM

## 2023-01-18 DIAGNOSIS — J069 Acute upper respiratory infection, unspecified: Secondary | ICD-10-CM | POA: Insufficient documentation

## 2023-01-18 DIAGNOSIS — I5032 Chronic diastolic (congestive) heart failure: Secondary | ICD-10-CM

## 2023-01-18 DIAGNOSIS — R0602 Shortness of breath: Secondary | ICD-10-CM

## 2023-01-18 DIAGNOSIS — E782 Mixed hyperlipidemia: Secondary | ICD-10-CM | POA: Diagnosis not present

## 2023-01-18 DIAGNOSIS — I4811 Longstanding persistent atrial fibrillation: Secondary | ICD-10-CM

## 2023-01-18 DIAGNOSIS — Z0000 Encounter for general adult medical examination without abnormal findings: Secondary | ICD-10-CM

## 2023-01-18 DIAGNOSIS — K5900 Constipation, unspecified: Secondary | ICD-10-CM | POA: Insufficient documentation

## 2023-01-18 DIAGNOSIS — Z794 Long term (current) use of insulin: Secondary | ICD-10-CM

## 2023-01-18 DIAGNOSIS — M8580 Other specified disorders of bone density and structure, unspecified site: Secondary | ICD-10-CM | POA: Diagnosis not present

## 2023-01-18 DIAGNOSIS — N1831 Chronic kidney disease, stage 3a: Secondary | ICD-10-CM

## 2023-01-18 DIAGNOSIS — E0590 Thyrotoxicosis, unspecified without thyrotoxic crisis or storm: Secondary | ICD-10-CM

## 2023-01-18 DIAGNOSIS — E1122 Type 2 diabetes mellitus with diabetic chronic kidney disease: Secondary | ICD-10-CM

## 2023-01-18 DIAGNOSIS — I10 Essential (primary) hypertension: Secondary | ICD-10-CM | POA: Diagnosis not present

## 2023-01-18 LAB — LIPID PANEL
Cholesterol: 356 mg/dL — ABNORMAL HIGH (ref 0–200)
Cholesterol: 315 mg/dL — ABNORMAL HIGH (ref 0–200)
HDL: 56.6 mg/dL (ref >39.00)
HDL: 54.1 mg/dL (ref >39.00)
LDL Cholesterol: 274 mg/dL — ABNORMAL HIGH (ref 0–99)
LDL Cholesterol: 229 mg/dL — ABNORMAL HIGH (ref 0–99)
NonHDL: 299.33
NonHDL: 261.16
Total CHOL/HDL Ratio: 6
Total CHOL/HDL Ratio: 6
Triglycerides: 128 mg/dL (ref 0.0–149.0)
Triglycerides: 161 mg/dL — ABNORMAL HIGH (ref 0.0–149.0)
VLDL: 25.6 mg/dL (ref 0.0–40.0)
VLDL: 32.2 mg/dL (ref 0.0–40.0)

## 2023-01-18 LAB — COMPREHENSIVE METABOLIC PANEL
ALT: 15 U/L (ref 0–35)
AST: 22 U/L (ref 0–37)
Albumin: 4 g/dL (ref 3.5–5.2)
Alkaline Phosphatase: 71 U/L (ref 39–117)
BUN: 34 mg/dL — ABNORMAL HIGH (ref 6–23)
CO2: 37 mEq/L — ABNORMAL HIGH (ref 19–32)
Calcium: 10.2 mg/dL (ref 8.4–10.5)
Chloride: 94 mEq/L — ABNORMAL LOW (ref 96–112)
Creatinine, Ser: 0.99 mg/dL (ref 0.40–1.20)
GFR: 50.67 mL/min — ABNORMAL LOW (ref >60.00)
Glucose, Bld: 199 mg/dL — ABNORMAL HIGH (ref 70–99)
Potassium: 3.8 mEq/L (ref 3.5–5.1)
Sodium: 138 mEq/L (ref 135–145)
Total Bilirubin: 0.5 mg/dL (ref 0.2–1.2)
Total Protein: 7.7 g/dL (ref 6.0–8.3)

## 2023-01-18 LAB — CBC WITH DIFFERENTIAL/PLATELET
Basophils Absolute: 0 10*3/uL (ref 0.0–0.1)
Basophils Relative: 0.6 % (ref 0.0–3.0)
Eosinophils Absolute: 0.1 10*3/uL (ref 0.0–0.7)
Eosinophils Relative: 2.3 % (ref 0.0–5.0)
HCT: 46.2 % — ABNORMAL HIGH (ref 36.0–46.0)
Hemoglobin: 15.1 g/dL — ABNORMAL HIGH (ref 12.0–15.0)
Lymphocytes Relative: 14.9 % (ref 12.0–46.0)
Lymphs Abs: 0.9 10*3/uL (ref 0.7–4.0)
MCHC: 32.6 g/dL (ref 30.0–36.0)
MCV: 93.8 fl (ref 78.0–100.0)
Monocytes Absolute: 0.6 10*3/uL (ref 0.1–1.0)
Monocytes Relative: 9.9 % (ref 3.0–12.0)
Neutro Abs: 4.1 10*3/uL (ref 1.4–7.7)
Neutrophils Relative %: 72.3 % (ref 43.0–77.0)
Platelets: 164 10*3/uL (ref 150.0–400.0)
RBC: 4.93 Mil/uL (ref 3.87–5.11)
RDW: 13.9 % (ref 11.5–15.5)
WBC: 5.7 10*3/uL (ref 4.0–10.5)

## 2023-01-18 LAB — VITAMIN D 25 HYDROXY (VIT D DEFICIENCY, FRACTURES)
VITD: 75.93 ng/mL (ref 30.00–100.00)
VITD: 39.68 ng/mL (ref 30.00–100.00)

## 2023-01-18 LAB — TSH
TSH: 0.86 u[IU]/mL (ref 0.35–5.50)
TSH: 0.77 u[IU]/mL (ref 0.35–5.50)

## 2023-01-18 MED ORDER — DOXYCYCLINE HYCLATE 100 MG PO TABS: 100.0000 mg | ORAL_TABLET | Freq: Two times a day (BID) | ORAL | 0 refills | Status: DC

## 2023-01-18 NOTE — Assessment & Plan Note (Signed)
Acute on chronic Has been experiencing increased shortness of breath recently-had COVID earlier this month but feels it was mild and recovered from that Just recently started to get sick again and is having increased mucus-sounds viral in nature Chest x-ray today shows no acute cardiopulmonary disease-lungs are clear ?  Developing bronchitis versus viral-will start doxycycline twice daily x 10 days Continue symptomatic treatment Call if no improvement Monitor pulse ox and heart rate-she says they have been more variable at home, but both are good here-I think her initial heart rate was mistake Advised if heart rate or pulse ox are concerning to call us or cardiology

## 2023-01-18 NOTE — Assessment & Plan Note (Signed)
Chronic Management per Dr. [name] Lab Results  Component Value Date   HGBA1C 7.0 (A) 07/29/2022   On Ozempic 0.5 mg weekly, NovoLog 70/30 14 units with breakfast, 12 units in evening

## 2023-01-18 NOTE — Assessment & Plan Note (Signed)
Chronic Stable CBC, CMP, vitamin D level

## 2023-01-18 NOTE — Assessment & Plan Note (Signed)
Chronic Not currently on any cholesterol-lowering medication Lipid panel, CMP

## 2023-01-18 NOTE — Assessment & Plan Note (Signed)
Chronic DEXA due-ordered She is cautious in preventing falls Check vitamin D level Advised calcium and vitamin D daily

## 2023-01-18 NOTE — Assessment & Plan Note (Signed)
Chronic Blood pressure well controlled CMP Continue amlodipine 5 mg daily, Coreg 6.25 mg twice daily

## 2023-01-18 NOTE — Assessment & Plan Note (Signed)
New Has been experiencing increased constipation She has been eating less which could be contributing She is also not been exercising regularly Encourage good fluid intake Advised either starting a stool softener or MiraLAX daily Could also try prunes or increasing dietary fiber

## 2023-01-18 NOTE — Addendum Note (Signed)
Addended by: Marcina Millard on: 01/18/2023 02:51 PM   Modules accepted: Orders

## 2023-01-18 NOTE — Assessment & Plan Note (Addendum)
Chronic Follows with cardiology Euvolemic on exam-lungs are clear and legs without swelling Has some shortness of breath but I do not think is heart failure related Continue current medications

## 2023-01-18 NOTE — Assessment & Plan Note (Signed)
Chronic Following with cardiology On Eliquis 5 mg twice daily, Coreg 6.25 mg twice daily CBC, CMP, TSH

## 2023-01-18 NOTE — Assessment & Plan Note (Signed)
Chronic Following with cardiology Continue Coreg 6.25 mg twice daily, Eliquis 5 mg twice daily CBC, CMP, lipid panel, TSH

## 2023-01-18 NOTE — Assessment & Plan Note (Signed)
Acute Having increased mucus, some increase in shortness of breath ?  Viral versus bacterial Do not think she has an element of CHF Chest x-ray today shows no acute infection Will start doxycycline 100 mg twice daily x 10 days Symptomatic treatment Monitor heart rate and oxygen Call if symptoms worsen or do not improve

## 2023-01-19 ENCOUNTER — Telehealth: Payer: Self-pay | Admitting: Internal Medicine

## 2023-01-19 NOTE — Telephone Encounter (Signed)
Santiago Glad from [facility] called to discuss results from pt's blood work yesterday. Santiago Glad is requesting a call back from [facility].  Please call either:   [phone] or [phone]

## 2023-01-20 ENCOUNTER — Ambulatory Visit: Payer: Medicare Other | Admitting: Podiatry

## 2023-01-20 ENCOUNTER — Encounter: Payer: Self-pay | Admitting: Internal Medicine

## 2023-01-20 NOTE — Telephone Encounter (Signed)
Spoke with Santiago Glad.  Sent over add on sheet for labs to be credited.

## 2023-01-21 MED ORDER — CEFDINIR 300 MG PO CAPS: 300.0000 mg | ORAL_CAPSULE | Freq: Two times a day (BID) | ORAL | 0 refills | Status: DC

## 2023-01-21 MED ORDER — BENZONATATE 100 MG PO CAPS: 100.0000 mg | ORAL_CAPSULE | Freq: Three times a day (TID) | ORAL | 0 refills | Status: DC | PRN

## 2023-01-25 ENCOUNTER — Encounter: Payer: Self-pay | Admitting: Internal Medicine

## 2023-01-25 ENCOUNTER — Ambulatory Visit: Payer: Medicare Other | Admitting: Podiatry

## 2023-01-26 MED ORDER — HYDROCODONE BIT-HOMATROP MBR 5-1.5 MG/5ML PO SOLN: 5.0000 mL | Freq: Three times a day (TID) | ORAL | 0 refills | Status: DC | PRN

## 2023-01-26 NOTE — Addendum Note (Signed)
Addended by: [name] on: 01/26/2023 09:30 PM   Modules accepted: Orders

## 2023-01-29 ENCOUNTER — Encounter: Payer: Self-pay | Admitting: Internal Medicine

## 2023-01-31 ENCOUNTER — Encounter: Payer: Self-pay | Admitting: Internal Medicine

## 2023-02-01 MED ORDER — PREDNISONE 10 MG PO TABS: ORAL_TABLET | ORAL | 0 refills | Status: DC

## 2023-02-01 NOTE — Addendum Note (Signed)
Addended by: [name] on: 02/01/2023 09:42 AM   Modules accepted: Orders

## 2023-02-03 ENCOUNTER — Ambulatory Visit: Payer: Medicare Other | Admitting: Internal Medicine

## 2023-02-03 ENCOUNTER — Encounter: Payer: Self-pay | Admitting: Internal Medicine

## 2023-02-03 VITALS — BP 120/74 | HR 84 | Ht 63.0 in | Wt 155.2 lb

## 2023-02-03 DIAGNOSIS — E1122 Type 2 diabetes mellitus with diabetic chronic kidney disease: Secondary | ICD-10-CM | POA: Diagnosis not present

## 2023-02-03 DIAGNOSIS — Z794 Long term (current) use of insulin: Secondary | ICD-10-CM

## 2023-02-03 DIAGNOSIS — N1831 Chronic kidney disease, stage 3a: Secondary | ICD-10-CM

## 2023-02-03 DIAGNOSIS — E782 Mixed hyperlipidemia: Secondary | ICD-10-CM

## 2023-02-03 DIAGNOSIS — E669 Obesity, unspecified: Secondary | ICD-10-CM

## 2023-02-03 LAB — POCT GLYCOSYLATED HEMOGLOBIN (HGB A1C): Hemoglobin A1C: 7.3 % — AB (ref 4.0–5.6)

## 2023-02-03 NOTE — Progress Notes (Signed)
Patient ID: [name], female   DOB: 16-Sep-1931, 89 y.o.   MRN: [id number]  HPI: [name] is a 89 y.o.-year-old female, self-referred (her daughter is also my patient), for management of DM2, dx in 1994, insulin-dependent, uncontrolled, with complications (CAD, s/p NSTEMI 2008; CHF; PAD; atrial fibrillation/atrial flutter; peripheral neuropathy; CKD stage IIIa).   Last visit with me 6 months ago.  Patient's daughter accompanies her today and offers information about patient's diet, activity, and insulin doses.  Interim history: No increased urination, blurry vision, nausea, chest pain.   She had Covid 2 months ago. She has had a cough for 1 month. Now on Prednisone started 2 days ago.  Last night, blood sugar was very high so he she took a higher dose of insulin: 497.  Sugars improved afterwards.  Today is her last day of prednisone.  Cough is better, but not completely resolved.  Reviewed HbA1c: Lab Results  Component Value Date   HGBA1C 7.0 (A) 07/29/2022   HGBA1C 8.4 (A) 03/26/2022   HGBA1C 7.3 (A) 12/25/2021   HGBA1C 9.0 (A) 09/14/2021   HGBA1C 8.3 (A) 05/28/2021   HGBA1C 8.0 (A) 03/24/2021   HGBA1C 9.5 (A) 12/02/2020   HGBA1C 7.8 (A) 08/21/2020   HGBA1C 8.3 (H) 05/15/2020   HGBA1C 8.0 (A) 05/06/2020   HGBA1C 9.2 (H) 02/20/2020   HGBA1C 7.9 (A) 11/14/2019   HGBA1C 7.6 (A) 08/22/2019   HGBA1C 8.1 (A) 05/01/2019   HGBA1C 8.9 (H) 01/31/2019   HGBA1C 7.0 (A) 12/06/2018   HGBA1C 6.5 (A) 09/20/2018   HGBA1C 6.4 (A) 07/19/2018   HGBA1C 7.3 05/08/2018   HGBA1C 8.0 (H) 01/30/2018   HGBA1C 8.3 11/23/2017   HGBA1C 7.4 08/23/2017   HGBA1C 8.2 01/24/2017   HGBA1C 7.7 09/08/2016   HGBA1C 8.4 (H) 06/09/2016   HGBA1C 7.8 (H) 03/04/2016   HGBA1C 7.6 12/08/2015   HGBA1C 8.3 08/26/2015   HGBA1C 7.9 (H) 05/24/2015   HGBA1C 8.6 (H) 02/06/2015   HGBA1C 8.7 (H) 11/15/2014   HGBA1C 8.1 (H) 08/21/2014   HGBA1C 9.6 (H) 01/21/2014   HGBA1C 9.8 (H) 09/13/2013   HGBA1C 9.7 (H)  05/15/2013   HGBA1C 10.9 (H) 02/01/2013   HGBA1C 8.9 (H) 09/26/2012   HGBA1C 9.9 (H) 05/29/2012   HGBA1C 8.1 (H) 09/06/2011   HGBA1C 8.3 (H) 07/13/2011   Pt is on a regimen of: - Humalog 75/25 >> 70/30 Novolin (24$ at [place], w/o Rx) 32 units before breakfast and 6 units before dinner >> 28-30 units before breakfast >> 12 units 2x a day before meals >> 16 units in the a.m. and 12 units before dinner >> 14 units 30 min before breakfast and 10-12 units 30 min before dinner - Ozempic 0.25 >> 0.5 mg weekly-added 08/2021 >> stopped 12/2021 2/2 $$ but now new insurance >> restarted (patient assistance)  Pt checks her sugars 2-4x a day and they are: - am: 108-151, 165 >> 104-184, 215 >> 89-158, 164, 186 >> 92-170 - 2h after b'fast: n/c >> 154 >> 172 >> n/c - before lunch: 229, 362 >> 125-194, 220 >> 248 >> 189 >> 94-179 - 2h after lunch: n/c  >> 148 - before dinner: 129, 144 >> 78-118, 178. 199  >> 116-140, 216 - 2h after dinner:  70, 159-187 >> 420 >> 171, 198 >> n/c >> 165 - bedtime: 61, 100, 181>> 110-209, 274, 497 >> 130, 389, 497 - nighttime: n/c >> 93-227 >> 186-286 >>  Lowest sugar was 64 >> 51 >> 104 >>  59 >> 92; she has hypoglycemia awareness at 70.  Highest sugar was 270 >> 260 >> 420 >> 497 (steroids).  Glucometer: One Touch Verio IQ  Pt's meals are: - Breakfast: frozen banana, berries, oatmeal, protein powder >> yogurt and fruit - dinner: Includes soup  But she has lunch and dinner in the cafeteria ([place] - facility: [facility]):   -+ History of CKD stage IIIa, last BUN/creatinine:  Lab Results  Component Value Date   BUN 31 (H) 12/17/2021   BUN 16 02/05/2021   CREATININE 1.24 (H) 12/17/2021   CREATININE 0.84 02/05/2021  She is not on an ACE inhibitor/ARB  -+ HL; last set of lipids: Lab Results  Component Value Date   CHOL 315 (H) 01/18/2023   HDL 54.10 01/18/2023   LDLCALC 229 (H) 01/18/2023   LDLDIRECT 144.6 02/03/2010   TRIG 161.0 (H)  01/18/2023   CHOLHDL 6 01/18/2023  She had muscle weakness from statins.   - last eye exam was on 11/16/2021: No DR  - no numbness and tingling in her feet.  She saw podiatry, but not recently.  Last foot exam was 07/29/2022 - here in the office.  Pt has FH of DM in mother.  She also has a history of HTN, osteoporosis, protein-calorie malnutrition, iron deficiency anemia. No FH of MTC, no pancreatitis hx.  ROS: + See HPI  Past Medical History:  Diagnosis Date   Anemia    iron defficiency   Anginal pain ([place])    Arthritis    "knees, feet, hands; joints" (05/27/2015)   Asthma    Atrial fibrillation or flutter    s/p RFCA 7/08;   s/p DCCV in past;   previously on amiodarone;  amio stopped due to lung toxicity   CAD (coronary artery disease)    s/p NSTEMI tx with BMS to OM1 3/08;  cath 3/08: pOM 99% tx with PCI, pLAD 20%, ? mod stenosis at the AM   CAP (community acquired pneumonia) 05/24/2015   CHF (congestive heart failure) ([place])    Chronic diastolic heart failure ([place])    echo 11/11:  EF 55-60%, severe LVH, mod LAE, mild MR, mildly increased PASP   COPD (chronic obstructive pulmonary disease) (HCC)    Degenerative joint disease    Dystrophy, corneal stromal    Heart murmur    HLD (hyperlipidemia)    HTN (hypertension)    essential nos   Hypopotassemia    PMH of   Muscle pain    Myocardial infarction ([place]) 2008   OSA on CPAP    Osteoporosis    Pneumonia 05/27/2015   Protein calorie malnutrition (H[facility])    Rash and nonspecific skin eruption    both arms,awaiting bio   dr Delman Cheadle   Seasonal allergies    Shortness of breath dyspnea    Type II diabetes mellitus ([place])    Dr Loanne Drilling   Past Surgical History:  Procedure Laterality Date   A FLUTTER ABLATION     Dr Lovena Le   ABDOMINAL AORTOGRAM W/LOWER EXTREMITY Bilateral 04/11/2020   Procedure: ABDOMINAL AORTOGRAM W/LOWER EXTREMITY;  Surgeon: Angelia Mould, MD;  Location: [facility];  Service: Cardiovascular;   Laterality: Bilateral;   CATARACT EXTRACTION W/ INTRAOCULAR LENS  IMPLANT, BILATERAL Bilateral    CHOLECYSTECTOMY     COLONOSCOPY  6/07   2 polyps Dr. Kinnie Feil in [place]   colonoscopy with polypectomy      X 2; [facility]   CORNEAL TRANSPLANT Bilateral  CORONARY ANGIOPLASTY WITH STENT PLACEMENT  02/2007   BMS w/Dr Lovena Le   CORONARY ATHERECTOMY N/A 05/16/2020   Procedure: CORONARY ATHERECTOMY;  Surgeon: Martinique, Peter M, MD;  Location: [facility];  Service: Cardiovascular;  Laterality: N/A;   CORONARY BALLOON ANGIOPLASTY N/A 05/16/2020   Procedure: CORONARY BALLOON ANGIOPLASTY;  Surgeon: Martinique, Peter M, MD;  Location: [facility];  Service: Cardiovascular;  Laterality: N/A;   CORONARY STENT INTERVENTION N/A 05/16/2020   Procedure: CORONARY STENT INTERVENTION;  Surgeon: Martinique, Peter M, MD;  Location: [facility];  Service: Cardiovascular;  Laterality: N/A;   EYE SURGERY     gravid 2 para 2     INTRAVASCULAR ULTRASOUND/IVUS N/A 05/16/2020   Procedure: Intravascular Ultrasound/IVUS;  Surgeon: Martinique, Peter M, MD;  Location: [facility];  Service: Cardiovascular;  Laterality: N/A;   KNEE CARTILAGE SURGERY Right    LEFT AND RIGHT HEART CATHETERIZATION WITH CORONARY ANGIOGRAM N/A 05/07/2014   Procedure: LEFT AND RIGHT HEART CATHETERIZATION WITH CORONARY ANGIOGRAM;  Surgeon: Peter M Martinique, MD;  Location: [facility];  Service: Cardiovascular;  Laterality: N/A;   PERIPHERAL VASCULAR INTERVENTION  04/11/2020   Procedure: PERIPHERAL VASCULAR INTERVENTION;  Surgeon: Angelia Mould, MD;  Location: [facility];  Service: Cardiovascular;;   RIGHT HEART CATH N/A 02/04/2021   Procedure: RIGHT HEART CATH;  Surgeon: Jolaine Artist, MD;  Location: [facility];  Service: Cardiovascular;  Laterality: N/A;   RIGHT/LEFT HEART CATH AND CORONARY ANGIOGRAPHY N/A 05/16/2020   Procedure: RIGHT/LEFT HEART CATH AND CORONARY ANGIOGRAPHY;  Surgeon: Jolaine Artist, MD;   Location: [facility];  Service: Cardiovascular;  Laterality: N/A;   TOTAL KNEE ARTHROPLASTY Right 06/28/2016   Procedure: TOTAL KNEE ARTHROPLASTY;  Surgeon: Frederik Pear, MD;  Location: [facility];  Service: Orthopedics;  Laterality: Right;   Social History   Socioeconomic History   Marital status: Widowed    Spouse name: Not on file   Number of children: 2   Years of education: 16   Highest education level: Bachelor's degree (e.g., BA, AB, BS)  Occupational History   Occupation: Teacher/ Retired  Tobacco Use   Smoking status: Former    Packs/day: 0.25    Years: 18.00    Total pack years: 4.50    Types: Cigarettes    Quit date: 12/27/1968    Years since quitting: 54.1   Smokeless tobacco: Never   Tobacco comments:    smoked [place], up to 1 pp week  Vaping Use   Vaping Use: Never used  Substance and Sexual Activity   Alcohol use: Yes    Comment: occ   Drug use: No   Sexual activity: Never  Other Topics Concern   Not on file  Social History Narrative   Teacher. Widowed. Rarely drinks cafeine.    Social Determinants of Health   Financial Resource Strain: Low Risk  (09/06/2022)   Overall Financial Resource Strain (CARDIA)    Difficulty of Paying Living Expenses: Not hard at all  Food Insecurity: No Food Insecurity (09/06/2022)   Hunger Vital Sign    Worried About Running Out of Food in the Last Year: Never true    Ran Out of Food in the Last Year: Never true  Transportation Needs: No Transportation Needs (09/06/2022)   PRAPARE - Hydrologist (Medical): No    Lack of Transportation (Non-Medical): No  Physical Activity: Sufficiently Active (09/06/2022)   Exercise Vital  Sign    Days of Exercise per Week: 5 days    Minutes of Exercise per Session: 30 min  Stress: No Stress Concern Present (09/06/2022)   [place]    Feeling of Stress : Only a little  Social Connections:  Socially Integrated (09/06/2022)   Social Connection and Isolation Panel [NHANES]    Frequency of Communication with Friends and Family: More than three times a week    Frequency of Social Gatherings with Friends and Family: More than three times a week    Attends Religious Services: More than 4 times per year    Active Member of [employer] or Organizations: Yes    Attends Music therapist: More than 4 times per year    Marital Status: Married  Human resources officer Violence: Not At Risk (09/06/2022)   Humiliation, Afraid, Rape, and Kick questionnaire    Fear of Current or Ex-Partner: No    Emotionally Abused: No    Physically Abused: No    Sexually Abused: No   Current Outpatient Medications on File Prior to Visit  Medication Sig Dispense Refill   acetaminophen (TYLENOL) 325 MG tablet Take 2 tablets (650 mg total) by mouth every 6 (six) hours as needed for mild pain or headache. 20 tablet 0   amLODipine (NORVASC) 5 MG tablet Take 1 tablet (5 mg total) by mouth daily. 90 tablet 3   benzonatate (TESSALON) 100 MG capsule Take 1 capsule (100 mg total) by mouth 3 (three) times daily as needed for cough. 30 capsule 0   Blood Glucose Monitoring Suppl (ONETOUCH VERIO) w/Device KIT Use as instructed to check blood sugar 1 kit 0   carvedilol (COREG) 6.25 MG tablet TAKE 1 TABLET BY MOUTH TWICE  DAILY WITH MEALS 180 tablet 3   cefdinir (OMNICEF) 300 MG capsule Take 1 capsule (300 mg total) by mouth 2 (two) times daily. 20 capsule 0   ELIQUIS 5 MG TABS tablet TAKE 1 TABLET BY MOUTH  TWICE DAILY 60 tablet 11   HYDROcodone bit-homatropine (HYCODAN) 5-1.5 MG/5ML syrup Take 5 mLs by mouth every 8 (eight) hours as needed for cough. 120 mL 0   insulin aspart protamine- aspart (NOVOLOG MIX 70/30) (70-30) 100 UNIT/ML injection Inject 14 Units into the skin daily with breakfast. 12 units in the PM     Lancet Devices (LANCING DEVICE) MISC Use as advised - for Delica Lancets 1 each 0   Lancets (ONETOUCH DELICA  PLUS [id number]) MISC USE TO MONITOR GLUCOSE  LEVELS TWICE DAILY 200 each 3   Multiple Vitamins-Minerals (CENTRUM SILVER ULTRA WOMENS PO) Take 1 tablet by mouth daily.     nitroGLYCERIN (NITROSTAT) 0.4 MG SL tablet Place 1 tablet (0.4 mg total) under the tongue every 5 (five) minutes as needed for chest pain. Reported on 01/28/2016 30 tablet 3   NOVOFINE PLUS PEN NEEDLE 32G X 4 MM MISC SMARTSIG:1 Each SUB-Q Daily 200 each 1   ONETOUCH VERIO test strip CHECK BLOOD SUGAR TWICE  DAILY 200 strip 3   OXYGEN Inhale 3 Doses into the lungs as needed. 3 liters     potassium chloride SA (KLOR-CON M) 20 MEQ tablet TAKE 2 TABLETS BY MOUTH  DAILY 180 tablet 3   predniSONE (DELTASONE) 10 MG tablet 3 tabs po qd x 3 days, then 2 tabs po qd x 3 days, then 1 tab po qd x 3 days 18 tablet 0   Semaglutide,0.25 or 0.'5MG'$ /DOS, (OZEMPIC, 0.25 OR 0.5  MG/DOSE,) 2 MG/1.5ML SOPN Inject 0.5 mg into the skin once a week. 4.5 mL 3   torsemide (DEMADEX) 20 MG tablet TAKE 2 TABLETS BY MOUTH  TWICE DAILY 360 tablet 3   No current facility-administered medications on file prior to visit.   Allergies  Allergen Reactions   Levaquin [Levofloxacin] Shortness Of Breath and Swelling    angioedema   Lobster [Shellfish Allergy] Other (See Comments)    angioedema   Penicillins Rash    Has patient had a PCN reaction causing immediate rash, facial/tongue/throat swelling, SOB or lightheadedness with hypotension: Yes Has patient had a PCN reaction causing severe rash involving mucus membranes or skin necrosis: No Has patient had a PCN reaction that required hospitalization: No Has patient had a PCN reaction occurring within the last 10 years: No If all of the above answers are "NO", then may proceed with Cephalosporin use.   Valsartan Other (See Comments)    REACTION: angioedema   Codeine Other (See Comments)    Mental status changes   Lipitor [Atorvastatin] Other (See Comments)    weakness   Oxycodone Other (See Comments)     hallucinations   Statins     Made her too weak   Tramadol    Amlodipine Besylate Other (See Comments)    REACTION: tingling in lips & gum edema 7/12: talked to patient, states she is tolerating well   Crestor [Rosuvastatin Calcium] Rash   Tramadol Hcl Nausea And Vomiting   Family History  Problem Relation Age of Onset   Diabetes Mother    Hypertension Mother    Transient ischemic attack Mother    Arthritis Mother    Heart attack Father 23   Arthritis Father    Hypertension Father    Breast cancer Maternal Aunt    Arthritis Maternal Aunt    PE: BP 120/74 (BP Location: Right Arm, Patient Position: Sitting, Cuff Size: Normal)   Pulse 84   Ht '5\' 3"'$  (1.6 m)   Wt 155 lb 3.2 oz (70.4 kg)   SpO2 94%   BMI 27.49 kg/m  Wt Readings from Last 3 Encounters:  02/03/23 155 lb 3.2 oz (70.4 kg)  01/18/23 154 lb (69.9 kg)  01/17/23 155 lb (70.3 kg)   Constitutional: Slightly overweight, in NAD Eyes: EOMI, no exophthalmos ENT: no thyromegaly, no cervical lymphadenopathy Cardiovascular: RRR, No MRG, B pitting LE edema - wears compression hoses Respiratory: CTA B Musculoskeletal: no deformities Skin: + stasis dermatitis rash - bilateral lower legs Neurological: no tremor with outstretched hands  ASSESSMENT: 1. DM2, insulin-dependent, uncontrolled, with complications - CAD, s/p NSTEMI 2008 - CHF - PAD - h/o R stent - atrial fibrillation/atrial flutter -previously on amiodarone - peripheral neuropathy - CKD stage IIIa  2. Obesity class 1  3. HL  PLAN:  1. Patient with longstanding, uncontrolled, type 2 diabetes, on a premixed insulin regimen to which we added a GLP-1 receptor agonist, with improvement in blood sugars.  At last visit, HbA1c was 7.0%, at goal.  As she was having occasional lower blood sugars before lunch, I advised her to decrease the premixed insulin regimen in the morning and to vary the dose of insulin before dinner.  We continued Ozempic at the same dose.  This  is tolerated well. -She had a hard time since last visit with increased stress as her son is quite sick, also, with the holidays, and being sick herself with COVID and now protracted cough.  She has intolerance to multiple medications so  it is difficult for her to have appropriate treatment for her URI.  However, she was started on prednisone 3 days ago and she feels that her cough started to improve.  However, sugars increased to almost 500 last night.  She took slightly more insulin and they improved almost to target last night but they were again higher this morning. -As of now, reviewing her blood sugars at home, they appear to be mostly at goal with occasional hyperglycemic spikes before her steroid treatment.  We discussed that we can probably continue the same regimen, but she may need to increase the dose of insulin by 2 to 4 units today and may be tomorrow if the sugars remain high, and every time she needs to take prednisone.  She agrees with the plan. - I suggested to:  Patient Instructions  Please continue: - Novolin 70/30 14 units 30 min before breakfast and 10-12 units 30 min before dinner. You can increase the doses by 2-4 units with every dose if you get back on Prednisone.  Please continue: - Ozempic 0.5 mg weekly  Please return in 4 months with your sugar log.   - we checked her HbA1c: 7.3% (higher) - advised to check sugars at different times of the day - 2x a day, rotating check times - advised for yearly eye exams >> she is not UTD >> coming up - return to clinic in 4 months  2.  Obesity class I -At previous visits, I suggested to increase intake of fresh fruit, dried fruit, and other snacks.  I also recommended to try to switch from smoothies in the morning to oatmeal. -At last visits we discussed about stopping protein shakes between meals -We continue Ozempic at a low dose, which is tolerated well. -Lost 8 pounds before last visit, 3 more since last OV  3.  HL -Reviewed latest lipid panel from 12/2022: Extremely high LDL, triglycerides mildly elevated, HDL at goal: Lab Results  Component Value Date   CHOL 315 (H) 01/18/2023   HDL 54.10 01/18/2023   LDLCALC 229 (H) 01/18/2023   LDLDIRECT 144.6 02/03/2010   TRIG 161.0 (H) 01/18/2023   CHOLHDL 6 01/18/2023  -She is not on a statin due to previous muscle weakness. -Options for treatment are ezetimibe and evolocumab/alirocumab.  However, she is on palliative care, with only mandatory medications so I will let her discuss with PCP about treatment aggressiveness.  She has an appointment coming up.  Philemon Kingdom, MD PhD [facility] Endocrinology

## 2023-02-03 NOTE — Patient Instructions (Addendum)
Please continue: - Novolin 70/30 14 units 30 min before breakfast and 10-12 units 30 min before dinner. You can increase the doses by 2-4 units with every dose if you get back on Prednisone.  Please continue: - Ozempic 0.5 mg weekly  Please return in 4 months with your sugar log.

## 2023-02-15 ENCOUNTER — Encounter: Payer: Self-pay | Admitting: Podiatry

## 2023-02-15 ENCOUNTER — Ambulatory Visit: Payer: Medicare Other | Admitting: Podiatry

## 2023-02-15 VITALS — BP 156/83 | HR 79

## 2023-02-15 DIAGNOSIS — Z7901 Long term (current) use of anticoagulants: Secondary | ICD-10-CM | POA: Diagnosis not present

## 2023-02-15 DIAGNOSIS — I739 Peripheral vascular disease, unspecified: Secondary | ICD-10-CM

## 2023-02-15 DIAGNOSIS — B351 Tinea unguium: Secondary | ICD-10-CM | POA: Diagnosis not present

## 2023-02-15 DIAGNOSIS — M79674 Pain in right toe(s): Secondary | ICD-10-CM | POA: Diagnosis not present

## 2023-02-15 DIAGNOSIS — M79675 Pain in left toe(s): Secondary | ICD-10-CM

## 2023-02-15 NOTE — Progress Notes (Signed)
Subjective:   Patient ID: [name], female   DOB: 89 y.o.   MRN: [id number]   HPI Chief Complaint  Patient presents with   Nail Problem    Toenails bilateral - thick and discolored in a few nails x years, lost a part of the left hallux nail, diabetic - last A1c was 7.3, has neuropathy, but "they don't keep me awake at night", no meds taken for neuropathy   New Patient (Initial Visit)    Est pt 5960   89 year old female presents the above concerns.  She states her nails are thickened discolored and has been ongoing for some time.  Left big toenail has previously has come off.  Denies any swelling redness or any drainage.   Has 43% circulation. The toes on the right side may be darker than normal. She gets some shotting pain to the toes. In the morning her right leg is numb and hte left is partially numb and the feet are numb. She does exercises to keep the cirucalation going.   Review of Systems  All other systems reviewed and are negative.  Past Medical History:  Diagnosis Date   Anemia    iron defficiency   Anginal pain ([place])    Arthritis    "knees, feet, hands; joints" (05/27/2015)   Asthma    Atrial fibrillation or flutter    s/p RFCA 7/08;   s/p DCCV in past;   previously on amiodarone;  amio stopped due to lung toxicity   CAD (coronary artery disease)    s/p NSTEMI tx with BMS to OM1 3/08;  cath 3/08: pOM 99% tx with PCI, pLAD 20%, ? mod stenosis at the AM   CAP (community acquired pneumonia) 05/24/2015   CHF (congestive heart failure) ([place])    Chronic diastolic heart failure ([place])    echo 11/11:  EF 55-60%, severe LVH, mod LAE, mild MR, mildly increased PASP   COPD (chronic obstructive pulmonary disease) (HCC)    Degenerative joint disease    Dystrophy, corneal stromal    Heart murmur    HLD (hyperlipidemia)    HTN (hypertension)    essential nos   Hypopotassemia    PMH of   Muscle pain    Myocardial infarction ([place]) 2008   OSA on CPAP    Osteoporosis     Pneumonia 05/27/2015   Protein calorie malnutrition (HCC)    Rash and nonspecific skin eruption    both arms,awaiting bio   dr Delman Cheadle   Seasonal allergies    Shortness of breath dyspnea    Type II diabetes mellitus ([place])    Dr Loanne Drilling    Past Surgical History:  Procedure Laterality Date   A FLUTTER ABLATION     Dr Lovena Le   ABDOMINAL AORTOGRAM W/LOWER EXTREMITY Bilateral 04/11/2020   Procedure: ABDOMINAL AORTOGRAM W/LOWER EXTREMITY;  Surgeon: Angelia Mould, MD;  Location: [facility];  Service: Cardiovascular;  Laterality: Bilateral;   CATARACT EXTRACTION W/ INTRAOCULAR LENS  IMPLANT, BILATERAL Bilateral    CHOLECYSTECTOMY     COLONOSCOPY  6/07   2 polyps Dr. Kinnie Feil in [place]   colonoscopy with polypectomy      X 2; [facility]   CORNEAL TRANSPLANT Bilateral    CORONARY ANGIOPLASTY WITH STENT PLACEMENT  02/2007   BMS w/Dr Lovena Le   CORONARY ATHERECTOMY N/A 05/16/2020   Procedure: CORONARY ATHERECTOMY;  Surgeon: Martinique, Peter M, MD;  Location: [facility];  Service: Cardiovascular;  Laterality: N/A;  CORONARY BALLOON ANGIOPLASTY N/A 05/16/2020   Procedure: CORONARY BALLOON ANGIOPLASTY;  Surgeon: Martinique, Peter M, MD;  Location: [facility];  Service: Cardiovascular;  Laterality: N/A;   CORONARY STENT INTERVENTION N/A 05/16/2020   Procedure: CORONARY STENT INTERVENTION;  Surgeon: Martinique, Peter M, MD;  Location: [facility] [place];  Service: Cardiovascular;  Laterality: N/A;   EYE SURGERY     gravid 2 para 2     INTRAVASCULAR ULTRASOUND/IVUS N/A 05/16/2020   Procedure: Intravascular Ultrasound/IVUS;  Surgeon: Martinique, Peter M, MD;  Location: [facility];  Service: Cardiovascular;  Laterality: N/A;   KNEE CARTILAGE SURGERY Right    LEFT AND RIGHT HEART CATHETERIZATION WITH CORONARY ANGIOGRAM N/A 05/07/2014   Procedure: LEFT AND RIGHT HEART CATHETERIZATION WITH CORONARY ANGIOGRAM;  Surgeon: Peter M Martinique, MD;  Location: [facility];  Service: Cardiovascular;   Laterality: N/A;   PERIPHERAL VASCULAR INTERVENTION  04/11/2020   Procedure: PERIPHERAL VASCULAR INTERVENTION;  Surgeon: Angelia Mould, MD;  Location: [facility];  Service: Cardiovascular;;   RIGHT HEART CATH N/A 02/04/2021   Procedure: RIGHT HEART CATH;  Surgeon: Jolaine Artist, MD;  Location: [facility];  Service: Cardiovascular;  Laterality: N/A;   RIGHT/LEFT HEART CATH AND CORONARY ANGIOGRAPHY N/A 05/16/2020   Procedure: RIGHT/LEFT HEART CATH AND CORONARY ANGIOGRAPHY;  Surgeon: Jolaine Artist, MD;  Location: [facility];  Service: Cardiovascular;  Laterality: N/A;   TOTAL KNEE ARTHROPLASTY Right 06/28/2016   Procedure: TOTAL KNEE ARTHROPLASTY;  Surgeon: Frederik Pear, MD;  Location: [facility];  Service: Orthopedics;  Laterality: Right;     Current Outpatient Medications:    acetaminophen (TYLENOL) 325 MG tablet, Take 2 tablets (650 mg total) by mouth every 6 (six) hours as needed for mild pain or headache., Disp: 20 tablet, Rfl: 0   amLODipine (NORVASC) 5 MG tablet, Take 1 tablet (5 mg total) by mouth daily., Disp: 90 tablet, Rfl: 3   Blood Glucose Monitoring Suppl (ONETOUCH VERIO) w/Device KIT, Use as instructed to check blood sugar, Disp: 1 kit, Rfl: 0   carvedilol (COREG) 6.25 MG tablet, TAKE 1 TABLET BY MOUTH TWICE  DAILY WITH MEALS, Disp: 180 tablet, Rfl: 3   ELIQUIS 5 MG TABS tablet, TAKE 1 TABLET BY MOUTH  TWICE DAILY, Disp: 60 tablet, Rfl: 11   insulin aspart protamine- aspart (NOVOLOG MIX 70/30) (70-30) 100 UNIT/ML injection, Inject 14 Units into the skin daily with breakfast. 12 units in the PM, Disp: , Rfl:    Lancet Devices (LANCING DEVICE) MISC, Use as advised - for Delica Lancets, Disp: 1 each, Rfl: 0   Lancets (ONETOUCH DELICA PLUS 123XX123) MISC, USE TO MONITOR GLUCOSE  LEVELS TWICE DAILY, Disp: 200 each, Rfl: 3   Multiple Vitamins-Minerals (CENTRUM SILVER ULTRA WOMENS PO), Take 1 tablet by mouth daily., Disp: , Rfl:    nitroGLYCERIN  (NITROSTAT) 0.4 MG SL tablet, Place 1 tablet (0.4 mg total) under the tongue every 5 (five) minutes as needed for chest pain. Reported on 01/28/2016, Disp: 30 tablet, Rfl: 3   NOVOFINE PLUS PEN NEEDLE 32G X 4 MM MISC, SMARTSIG:1 Each SUB-Q Daily, Disp: 200 each, Rfl: 1   ONETOUCH VERIO test strip, CHECK BLOOD SUGAR TWICE  DAILY, Disp: 200 strip, Rfl: 3   OXYGEN, Inhale 3 Doses into the lungs as needed. 3 liters, Disp: , Rfl:    potassium chloride SA (KLOR-CON M) 20 MEQ tablet, TAKE 2 TABLETS BY MOUTH  DAILY, Disp: 180 tablet, Rfl: 3  Semaglutide,0.25 or 0.'5MG'$ /DOS, (OZEMPIC, 0.25 OR 0.5 MG/DOSE,) 2 MG/1.5ML SOPN, Inject 0.5 mg into the skin once a week., Disp: 4.5 mL, Rfl: 3   torsemide (DEMADEX) 20 MG tablet, TAKE 2 TABLETS BY MOUTH  TWICE DAILY, Disp: 360 tablet, Rfl: 3   Vitamin D, Cholecalciferol, 25 MCG (1000 UT) CAPS, Take 1 capsule by mouth daily., Disp: , Rfl:   Allergies  Allergen Reactions   Levaquin [Levofloxacin] Shortness Of Breath and Swelling    angioedema   Lobster [Shellfish Allergy] Other (See Comments)    angioedema   Penicillins Rash    Has patient had a PCN reaction causing immediate rash, facial/tongue/throat swelling, SOB or lightheadedness with hypotension: Yes Has patient had a PCN reaction causing severe rash involving mucus membranes or skin necrosis: No Has patient had a PCN reaction that required hospitalization: No Has patient had a PCN reaction occurring within the last 10 years: No If all of the above answers are "NO", then may proceed with Cephalosporin use.   Valsartan Other (See Comments)    REACTION: angioedema   Codeine Other (See Comments)    Mental status changes   Lipitor [Atorvastatin] Other (See Comments)    weakness   Oxycodone Other (See Comments)    hallucinations   Statins     Made her too weak   Tramadol    Amlodipine Besylate Other (See Comments)    REACTION: tingling in lips & gum edema 7/12: talked to patient, states she is tolerating  well   Crestor [Rosuvastatin Calcium] Rash   Tramadol Hcl Nausea And Vomiting          Objective:  Physical Exam  General: AAO x3, NAD  Dermatological: Nails are hypertrophic, dystrophic, brittle, discolored, elongated 10. No surrounding redness or drainage. Tenderness nails 1-5 bilaterally. No open lesions or pre-ulcerative lesions are identified today.  There is no extension of hyperpigmentation of the surrounding skin.  All the nails seem to the same color.  Vascular: Dorsalis Pedis artery and Posterior Tibial artery pedal pulses are decreased bilateral with immedate capillary fill time.  There is no pain with calf compression, swelling, warmth, erythema.   Neruologic: Grossly intact via light touch bilateral.   Musculoskeletal: No other areas of discomfort.  Gait: Unassisted, Nonantalgic.       Assessment:   Onychomycosis, PAD     Plan:  -Treatment options discussed including all alternatives, risks, and complications -Etiology of symptoms were discussed -Nails debrided 10 without complications or bleeding. -Given PAD, ordered updated ABI. -Daily foot inspection -Follow-up in 3 months or sooner if any problems arise. In the meantime, encouraged to call the office with any questions, concerns, change in symptoms.   Celesta Gentile, DPM

## 2023-02-16 ENCOUNTER — Encounter: Payer: Self-pay | Admitting: Internal Medicine

## 2023-02-17 ENCOUNTER — Ambulatory Visit (HOSPITAL_COMMUNITY)
Admission: RE | Admit: 2023-02-17 | Discharge: 2023-02-17 | Disposition: A | Discharge status: Home / Self Care | Payer: Medicare Other | Source: Ambulatory Visit | Attending: Internal Medicine | Admitting: Internal Medicine

## 2023-02-17 ENCOUNTER — Encounter (HOSPITAL_COMMUNITY): Payer: Self-pay | Admitting: Internal Medicine

## 2023-02-17 VITALS — BP 130/60 | HR 69 | Wt 153.6 lb

## 2023-02-17 DIAGNOSIS — I739 Peripheral vascular disease, unspecified: Secondary | ICD-10-CM | POA: Insufficient documentation

## 2023-02-17 DIAGNOSIS — I5032 Chronic diastolic (congestive) heart failure: Secondary | ICD-10-CM

## 2023-02-17 DIAGNOSIS — I5042 Chronic combined systolic (congestive) and diastolic (congestive) heart failure: Secondary | ICD-10-CM | POA: Insufficient documentation

## 2023-02-17 DIAGNOSIS — G4733 Obstructive sleep apnea (adult) (pediatric): Secondary | ICD-10-CM | POA: Diagnosis not present

## 2023-02-17 DIAGNOSIS — I110 Hypertensive heart disease with heart failure: Secondary | ICD-10-CM | POA: Insufficient documentation

## 2023-02-17 DIAGNOSIS — I252 Old myocardial infarction: Secondary | ICD-10-CM | POA: Insufficient documentation

## 2023-02-17 DIAGNOSIS — I482 Chronic atrial fibrillation, unspecified: Secondary | ICD-10-CM | POA: Diagnosis not present

## 2023-02-17 DIAGNOSIS — I10 Essential (primary) hypertension: Secondary | ICD-10-CM | POA: Diagnosis not present

## 2023-02-17 DIAGNOSIS — Z66 Do not resuscitate: Secondary | ICD-10-CM | POA: Insufficient documentation

## 2023-02-17 DIAGNOSIS — I2510 Atherosclerotic heart disease of native coronary artery without angina pectoris: Secondary | ICD-10-CM | POA: Insufficient documentation

## 2023-02-17 DIAGNOSIS — J4489 Other specified chronic obstructive pulmonary disease: Secondary | ICD-10-CM | POA: Insufficient documentation

## 2023-02-17 DIAGNOSIS — Z515 Encounter for palliative care: Secondary | ICD-10-CM | POA: Insufficient documentation

## 2023-02-17 DIAGNOSIS — I4892 Unspecified atrial flutter: Secondary | ICD-10-CM | POA: Diagnosis not present

## 2023-02-17 DIAGNOSIS — Z7902 Long term (current) use of antithrombotics/antiplatelets: Secondary | ICD-10-CM | POA: Insufficient documentation

## 2023-02-17 DIAGNOSIS — I272 Pulmonary hypertension, unspecified: Secondary | ICD-10-CM | POA: Insufficient documentation

## 2023-02-17 DIAGNOSIS — E119 Type 2 diabetes mellitus without complications: Secondary | ICD-10-CM | POA: Insufficient documentation

## 2023-02-17 DIAGNOSIS — Z7901 Long term (current) use of anticoagulants: Secondary | ICD-10-CM | POA: Insufficient documentation

## 2023-02-17 DIAGNOSIS — E782 Mixed hyperlipidemia: Secondary | ICD-10-CM

## 2023-02-17 NOTE — Addendum Note (Signed)
Encounter addended by: Jerl Mina, RN on: 02/17/2023 3:23 PM  Actions taken: Clinical Note Signed

## 2023-02-17 NOTE — Addendum Note (Signed)
Encounter addended by: Stanford Scotland, RN on: 02/17/2023 3:21 PM  Actions taken: Clinical Note Signed

## 2023-02-17 NOTE — Progress Notes (Signed)
Advanced Heart Failure Clinic Note   Date:  02/17/2023   ID:  [name], DOB [birth year], MRN [id number]  Location: Home  Provider location: [facility] Type of Visit: Established patient  PCP:  [name], MD  Cardiologist:  Pixie Casino, MD Primary HF: Slate Debroux  Chief Complaint: Heart Failure follow-up   History of Present Illness:  [name] is a 89 y.o. female with CAD s/p 1v stent, HTN, atrial fibrillation/A flutter and chronic prior systolic heart failure, (which was likely rate related) and diastolic heart failure. She was referred by Dr. Lovena Le for further evaluation of Pulmonary HTN.   Echo 2/22 EF 40-45%   Underwent R/L cath 05/07/14 Which showed stable CAD and significant PH with normal PVR.   In 4/21 saw Dr. Scot Dock and had LE angio with severe PAD underwent stenting of 70% R external iliac   Here for f/u. Previously she enrolled with home Palliative Care and we cut back many of her meds.  Feels great. Eating much more healthy. Has lost about 15 pounds. Denies SOB, CP, edema. BP much improved. Walks routinely. Started a book club and drama club.    Cardiac studies:  Myoview 9/20  Nuclear stress EF: 56%. There was no ST segment deviation noted during stress. Defect 1: There is a medium defect of moderate severity present in the mid inferolateral, apical inferior and apical lateral location. Findings consistent with possible mild ischemia in the apical inferior and inferolateral regions. The left ventricular ejection fraction is normal (55-65%).    Admitted in 5/21 with NSTEMI hstrop peak 1,455. Echo 5/21: EF 50-55%  Cath 05/16/20 with  1. Severe 3 vessel obstructive CAD 2. Severe pulmonary HTN 3. Successful PCI of the proximal LCx/OM1 with DES x 1 with IVUS guidance 4. Successful PCI of the proximal LAD with orbital atherectomy and DES x 1  RA = 6 RV = 75/6 PA = 77/26 (45) PCW = 10 Fick cardiac  output/index = 4.0/2.2 PVR = 8.8 WU FA sat = 99% PA sat = 67%, 73%  Admitted 2/22 with volume overload. Diuresed. Repeat RHC on 02/04/21 as below. Diuresed and started on sildenafil. D/c weight 167  RA = 6 RV = 88/5 PA =  86/33 (53) PCW = 21 Fick cardiac output/index = 3.4/1.9 PVR = 9.4 WU Ao sat = 97% PA sat = 64%, 64% PAPi = 8.8    Past Medical History:  Diagnosis Date   Anemia    iron defficiency   Anginal pain (HCC)    Arthritis    "knees, feet, hands; joints" (05/27/2015)   Asthma    Atrial fibrillation or flutter    s/p RFCA 7/08;   s/p DCCV in past;   previously on amiodarone;  amio stopped due to lung toxicity   CAD (coronary artery disease)    s/p NSTEMI tx with BMS to OM1 3/08;  cath 3/08: pOM 99% tx with PCI, pLAD 20%, ? mod stenosis at the AM   CAP (community acquired pneumonia) 05/24/2015   CHF (congestive heart failure) ([place])    Chronic diastolic heart failure ([place])    echo 11/11:  EF 55-60%, severe LVH, mod LAE, mild MR, mildly increased PASP   COPD (chronic obstructive pulmonary disease) ([facility])    Degenerative joint disease    Dystrophy, corneal stromal    Heart murmur    HLD (hyperlipidemia)    HTN (hypertension)    essential nos  Hypopotassemia    PMH of   Muscle pain    Myocardial infarction ([place]) 2008   OSA on CPAP    Osteoporosis    Pneumonia 05/27/2015   Protein calorie malnutrition (HCC)    Rash and nonspecific skin eruption    both arms,awaiting bio   dr Delman Cheadle   Seasonal allergies    Shortness of breath dyspnea    Type II diabetes mellitus ([place])    Dr Loanne Drilling   Past Surgical History:  Procedure Laterality Date   A FLUTTER ABLATION     Dr Lovena Le   ABDOMINAL AORTOGRAM W/LOWER EXTREMITY Bilateral 04/11/2020   Procedure: ABDOMINAL AORTOGRAM W/LOWER EXTREMITY;  Surgeon: Angelia Mould, MD;  Location: [facility];  Service: Cardiovascular;  Laterality: Bilateral;   CATARACT EXTRACTION W/ INTRAOCULAR LENS  IMPLANT, BILATERAL  Bilateral    CHOLECYSTECTOMY     COLONOSCOPY  6/07   2 polyps Dr. Kinnie Feil in [place]   colonoscopy with polypectomy      X 2; [facility]   CORNEAL TRANSPLANT Bilateral    CORONARY ANGIOPLASTY WITH STENT PLACEMENT  02/2007   BMS w/Dr Lovena Le   CORONARY ATHERECTOMY N/A 05/16/2020   Procedure: CORONARY ATHERECTOMY;  Surgeon: Martinique, Peter M, MD;  Location: [facility];  Service: Cardiovascular;  Laterality: N/A;   CORONARY BALLOON ANGIOPLASTY N/A 05/16/2020   Procedure: CORONARY BALLOON ANGIOPLASTY;  Surgeon: Martinique, Peter M, MD;  Location: [facility];  Service: Cardiovascular;  Laterality: N/A;   CORONARY STENT INTERVENTION N/A 05/16/2020   Procedure: CORONARY STENT INTERVENTION;  Surgeon: Martinique, Peter M, MD;  Location: [facility];  Service: Cardiovascular;  Laterality: N/A;   EYE SURGERY     gravid 2 para 2     INTRAVASCULAR ULTRASOUND/IVUS N/A 05/16/2020   Procedure: Intravascular Ultrasound/IVUS;  Surgeon: Martinique, Peter M, MD;  Location: [facility];  Service: Cardiovascular;  Laterality: N/A;   KNEE CARTILAGE SURGERY Right    LEFT AND RIGHT HEART CATHETERIZATION WITH CORONARY ANGIOGRAM N/A 05/07/2014   Procedure: LEFT AND RIGHT HEART CATHETERIZATION WITH CORONARY ANGIOGRAM;  Surgeon: Peter M Martinique, MD;  Location[facility];  Service: Cardiovascular;  Laterality: N/A;   PERIPHERAL VASCULAR INTERVENTION  04/11/2020   Procedure: PERIPHERAL VASCULAR INTERVENTION;  Surgeon: Angelia Mould, MD;  Location: [facility];  Service: Cardiovascular;;   RIGHT HEART CATH N/A 02/04/2021   Procedure: RIGHT HEART CATH;  Surgeon: Jolaine Artist, MD;  Location: [facility];  Service: Cardiovascular;  Laterality: N/A;   RIGHT/LEFT HEART CATH AND CORONARY ANGIOGRAPHY N/A 05/16/2020   Procedure: RIGHT/LEFT HEART CATH AND CORONARY ANGIOGRAPHY;  Surgeon: Jolaine Artist, MD;  Location: [facility] [facility];  Service: Cardiovascular;  Laterality: N/A;   TOTAL KNEE  ARTHROPLASTY Right 06/28/2016   Procedure: TOTAL KNEE ARTHROPLASTY;  Surgeon: Frederik Pear, MD;  Location: [facility];  Service: Orthopedics;  Laterality: Right;     Current Outpatient Medications  Medication Sig Dispense Refill   acetaminophen (TYLENOL) 325 MG tablet Take 2 tablets (650 mg total) by mouth every 6 (six) hours as needed for mild pain or headache. 20 tablet 0   amLODipine (NORVASC) 5 MG tablet Take 1 tablet (5 mg total) by mouth daily. 90 tablet 3   Blood Glucose Monitoring Suppl (ONETOUCH VERIO) w/Device KIT Use as instructed to check blood sugar 1 kit 0   carvedilol (COREG) 6.25 MG tablet TAKE 1 TABLET BY MOUTH TWICE  DAILY WITH MEALS  180 tablet 3   ELIQUIS 5 MG TABS tablet TAKE 1 TABLET BY MOUTH  TWICE DAILY 60 tablet 11   insulin aspart protamine- aspart (NOVOLOG MIX 70/30) (70-30) 100 UNIT/ML injection Inject 14 Units into the skin daily with breakfast. 12 units in the PM     Lancet Devices (LANCING DEVICE) MISC Use as advised - for Delica Lancets 1 each 0   Lancets (ONETOUCH DELICA PLUS 123XX123) MISC USE TO MONITOR GLUCOSE  LEVELS TWICE DAILY 200 each 3   Multiple Vitamins-Minerals (CENTRUM SILVER ULTRA WOMENS PO) Take 1 tablet by mouth daily.     nitroGLYCERIN (NITROSTAT) 0.4 MG SL tablet Place 1 tablet (0.4 mg total) under the tongue every 5 (five) minutes as needed for chest pain. Reported on 01/28/2016 30 tablet 3   NOVOFINE PLUS PEN NEEDLE 32G X 4 MM MISC SMARTSIG:1 Each SUB-Q Daily 200 each 1   ONETOUCH VERIO test strip CHECK BLOOD SUGAR TWICE  DAILY 200 strip 3   OXYGEN Inhale 3 Doses into the lungs as needed. 3 liters     potassium chloride SA (KLOR-CON M) 20 MEQ tablet TAKE 2 TABLETS BY MOUTH  DAILY 180 tablet 3   Semaglutide,0.25 or 0.5MG/DOS, (OZEMPIC, 0.25 OR 0.5 MG/DOSE,) 2 MG/1.5ML SOPN Inject 0.5 mg into the skin once a week. 4.5 mL 3   torsemide (DEMADEX) 20 MG tablet TAKE 2 TABLETS BY MOUTH  TWICE DAILY 360 tablet 3   Vitamin D, Cholecalciferol, 25 MCG (1000 UT)  CAPS Take 1 capsule by mouth daily.     No current facility-administered medications for this encounter.    Allergies:   Levaquin [levofloxacin], Lobster [shellfish allergy], Penicillins, Valsartan, Codeine, Lipitor [atorvastatin], Oxycodone, Statins, Tramadol, Amlodipine besylate, Crestor [rosuvastatin calcium], and Tramadol hcl   Social History:  The patient  reports that she quit smoking about 54 years ago. Her smoking use included cigarettes. She has a 4.50 pack-year smoking history. She has never used smokeless tobacco. She reports current alcohol use. She reports that she does not use drugs.   Family History:  The patient's family history includes Arthritis in her father, maternal aunt, and mother; Breast cancer in her maternal aunt; Diabetes in her mother; Heart attack (age of onset: 54) in her father; Hypertension in her father and mother; Transient ischemic attack in her mother.   ROS:  Please see the history of present illness.   All other systems are personally reviewed and negative.   Vitals:   02/17/23 1433  BP: 130/60  Pulse: 69  SpO2: 91%  Weight: 69.7 kg (153 lb 9.6 oz)   Wt Readings from Last 3 Encounters:  02/17/23 69.7 kg (153 lb 9.6 oz)  02/03/23 70.4 kg (155 lb 3.2 oz)  01/18/23 69.9 kg (154 lb)     Exam:   General:  Looks younger than stated age. No resp difficulty HEENT: normal Neck: supple. no JVD. Carotids 2+ bilat; no bruits. No lymphadenopathy or thryomegaly appreciated. Cor: PMI nondisplaced. Irregular rate & rhythm. No rubs, gallops or murmurs. Lungs: decreased throughout Abdomen: soft, nontender, nondistended. No hepatosplenomegaly. No bruits or masses. Good bowel sounds. Extremities: no cyanosis, clubbing, rash, edema Neuro: alert & orientedx3, cranial nerves grossly intact. moves all 4 extremities w/o difficulty. Affect pleasant   Recent Labs: 01/18/2023: Hemoglobin 15.1; Platelets 164.0; TSH 0.77    Wt Readings from Last 3 Encounters:   02/17/23 69.7 kg (153 lb 9.6 oz)  02/03/23 70.4 kg (155 lb 3.2 oz)  01/18/23 69.9 kg (154 lb)  ASSESSMENT AND PLAN:  1) Chronic diastolic CHF:  - Echo [id number] EF 60-65%, LA severely dilated, Echo 10/2017: EF 65-70%, grade 3 DD - Echo 5/21 EF 50-55% - Echo 2/22 EF 40-45% - Doing great. NYHA I - Volume status looks good - Continue torsemide 40 bid - Not on Entresto due to h/o angioedema with ARB - Not interested in further HF med titration.   2) CAD - H/o of stent in 2008 - NSTEMI 5/21. Cath as above. S/p PCI to LAD & OM - No s/s angina - On Plavix, apixaban. Will stop Plavix as she is 1 year out from PCI and high risk for bleeding.  - Unable to tolerate statins. Was on Repatha/zetia per [facility] but she stopped because she wanted to limit her meds.  3) PAD - severe  - s/p stenting to R external iliac with Dr. Scot Dock - Walking routinely without claudication  4) PAH - Moderate to severe by cath 5/21 and 2/22.  - suspect multifactorial  - she stopped sildenafil due to side effects  5) Chronic atrial fib/flutter - Rate controlled  This patients CHA2DS2-VASc Score and unadjusted Ischemic Stroke Rate (% per year) is equal to 7.2 % stroke rate/year from a score of 5 Above score calculated as 2 points each if present [Age > 75, or Stroke/TIA/TE] - Continue Eliquis.  - No bleeding   6) OSA/COPD - Follows with Dr. Halford Chessman.  - Off CPAP   7) HTN - Blood pressure well controlled. Continue current regimen.  8) DM2 - Stopped Jardiance due to cost  - No change  9) HL  - Recent LDL 229. Unable to tolerate statins. Refuses to reconsider. Wants to modify with diet - Followed by Dr. Quay Burow  10) DNR/DNI - continue Palliative Care support    Signed, Glori Bickers, MD  02/17/2023 3:09 PM  Advanced [facility] [address] [facility] [place] [address] [phone] (office) [phone] (fax)

## 2023-02-17 NOTE — Patient Instructions (Addendum)
No changes to your medications  Your physician recommends that you schedule a follow-up appointment in: 6 months( August)  ** please call the office in May to arrange your follow up appointment. **  If you have any questions or concerns before your next appointment please send [place] a message through [place] or call our office at [phone].    TO LEAVE A MESSAGE FOR THE NURSE SELECT OPTION 2, PLEASE LEAVE A MESSAGE INCLUDING: YOUR NAME DATE OF BIRTH CALL BACK NUMBER REASON FOR CALL**this is important as we prioritize the call backs  YOU WILL RECEIVE A CALL BACK THE SAME DAY AS LONG AS YOU CALL BEFORE 4:00 PM  At the [facility], you and your health needs are our priority. As part of our continuing mission to provide you with exceptional heart care, we have created designated Provider Care Teams. These Care Teams include your primary Cardiologist (physician) and Advanced Practice Providers (APPs- Physician Assistants and Nurse Practitioners) who all work together to provide you with the care you need, when you need it.   You may see any of the following providers on your designated Care Team at your next follow up: Dr Glori Bickers Dr Loralie Champagne Dr. Roxana Hires, NP Lyda Jester, [facility] [place], [place] Forestine Na, NP Audry Riles, PharmD   Please be sure to bring in all your medications bottles to every appointment.    Thank you for choosing [facility]

## 2023-03-01 ENCOUNTER — Ambulatory Visit (HOSPITAL_COMMUNITY)
Admission: RE | Admit: 2023-03-01 | Discharge: 2023-03-01 | Disposition: A | Discharge status: Home / Self Care | Payer: Medicare Other | Source: Ambulatory Visit | Attending: Podiatry | Admitting: Podiatry

## 2023-03-01 DIAGNOSIS — I739 Peripheral vascular disease, unspecified: Secondary | ICD-10-CM | POA: Diagnosis present

## 2023-03-01 LAB — VAS US ABI WITH/WO TBI
Left ABI: 0.49
Right ABI: 0.4

## 2023-03-05 ENCOUNTER — Encounter: Payer: Self-pay | Admitting: Podiatry

## 2023-03-23 ENCOUNTER — Encounter: Payer: Self-pay | Admitting: Internal Medicine

## 2023-03-23 MED ORDER — AZITHROMYCIN 500 MG PO TABS: 500.0000 mg | ORAL_TABLET | Freq: Once | ORAL | 0 refills | Status: DC

## 2023-04-19 ENCOUNTER — Ambulatory Visit (HOSPITAL_BASED_OUTPATIENT_CLINIC_OR_DEPARTMENT_OTHER): Payer: Medicare Other | Admitting: Pulmonary Disease

## 2023-04-28 ENCOUNTER — Encounter (HOSPITAL_BASED_OUTPATIENT_CLINIC_OR_DEPARTMENT_OTHER): Payer: Self-pay | Admitting: Pulmonary Disease

## 2023-04-30 ENCOUNTER — Encounter (HOSPITAL_BASED_OUTPATIENT_CLINIC_OR_DEPARTMENT_OTHER): Payer: Self-pay | Admitting: Pulmonary Disease

## 2023-05-04 NOTE — Telephone Encounter (Signed)
Please refer to other mychart message as it is on the same thing.

## 2023-05-05 ENCOUNTER — Non-Acute Institutional Stay: Payer: Medicare Other | Admitting: Family Medicine

## 2023-05-05 VITALS — BP 128/56 | HR 90 | Temp 97.8°F | Resp 16

## 2023-05-05 DIAGNOSIS — I5042 Chronic combined systolic (congestive) and diastolic (congestive) heart failure: Secondary | ICD-10-CM

## 2023-05-05 DIAGNOSIS — J9611 Chronic respiratory failure with hypoxia: Secondary | ICD-10-CM

## 2023-05-05 NOTE — Progress Notes (Signed)
Therapist, nutritional Palliative Care Consult Note Telephone: [phone]  Fax: [phone]   Date of encounter: 05/05/23 2:46 PM PATIENT NAME: [name] [address] [place] [address]   [id number] (home)  DOB: November 01, 1931 MRN: [id number] PRIMARY CARE PROVIDER:    Pincus Sanes, MD,  [address] [place] [address] [phone]  REFERRING PROVIDER:   Pincus Sanes, MD [address] [place],  [place] [address] [phone]  Emergency Contact:    Contact Information     Name Relation Home Work [place] Son [phone]  [phone]   Christiona, Kram Daughter [phone]  [phone]       Health Care POA/Health Care Agent   I met face to face with patient in [facility]. Palliative Care was asked to follow this patient by consultation request of Burns, Bobette Mo, MD to address advance care planning and complex medical decision making. This is a follow up visit.   Review of an existing advance directive document-MOST.  CODE STATUS: MOST as of 01/17/23: DNR/DNI with comfort measures Use of antibiotics and IV fluids on a case by case, time limited basis No feeding tube.    ASSESSMENT AND / RECOMMENDATIONS:  PPS: 70%  Chronic respiratory failure HX of CO2 retention. Encourage pt to maintain sat 92%. Continue follow up with pulmonology for 6 minute walk test. Continue O2 @ 2L per min currently  2.  Chronic combined systolic and diastolic heart failure Last EF 2/22 was 40-45% with mild mitral regurg. Intolerant of ARB/ACE-I due to angioedema Continue Carvedilol 6.25 mg BID and 40 mg Demadex BID with potassium replacement Maintain optimal HGB A1c around 7-7.5%     Follow up Palliative Care Visit:  Palliative Care continuing to follow up by monitoring for changes in appetite, weight, functional and cognitive status for chronic disease progression and management in  agreement with patient's stated goals of care. Next visit in 4-6 weeks or prn.  This visit was coded based on medical decision making (MDM).  Chief Complaint  Palliative Care is continuing to follow pf for chronic medical management of combined systolic and diastolic heart failure.  HISTORY OF PRESENT ILLNESS: [name] is a 89 y.o. year old female with chronic combined systolic and diastolic heart failure. She is going to see Pulmonology to start on portable oxygen concentrator and have a 6 minute walk test completed in June 2024. She states she is going to change her Oxygen company to [employer] and is not happy with [employer] who is charging her $60/month when she has been advised that there is no charge through [employer].  States she felt like her heart was racing yesterday so she took a NTG which helped.  She is stressed some about her son who has cancer who has received all the treatment he can.  She is in good spirits, has some pain and numbness in her legs. She is on 2-3 L/min O2. Denies PND, has orthopnea that is not changed.    ACTIVITIES OF DAILY LIVING: CONTINENT OF BLADDER/ BOWEL? No BATHING/DRESSING/FEEDING: Independent  MOBILITY:   Rollator  APPETITE? good WEIGHT: stable at 156 lbs  CURRENT PROBLEM LIST:  Patient Active Problem List   Diagnosis Date Noted   Seborrheic keratoses 01/18/2023   Constipation 01/18/2023   URI (upper respiratory infection) 01/18/2023   Post-viral cough syndrome 01/17/2023   Palliative care encounter 01/17/2023   Bilateral sensorineural hearing loss 01/12/2022   Chronic respiratory failure with  hypoxia (HCC) 05/12/2021   Poorly controlled type 2 diabetes mellitus with circulatory disorder (HCC) 01/31/2021   Bilateral impacted cerumen 11/25/2020   Neuropathy 08/19/2020   PAD (peripheral artery disease) (HCC) 02/29/2020   Chronic kidney disease (CKD) 02/20/2020   Osteoarthritis 08/01/2019   Arthritis of left sacroiliac joint 05/04/2017    Osteopenia 02/17/2017   Piriformis syndrome of left side 02/16/2017   Acquired leg length discrepancy 02/16/2017   COPD GOLD I  01/27/2017   Lumbar radiculopathy, acute 01/27/2017   Granuloma annulare 07/27/2016   Numbness of fingers 07/27/2016   Primary osteoarthritis of right knee 06/27/2016   Rash and nonspecific skin eruption 06/09/2016   Diabetes (HCC) 03/07/2016   Acute on chronic combined systolic (congestive) and diastolic (congestive) heart failure (HCC)    OSA  08/28/2014   Chronic diastolic heart failure (HCC) 07/30/2014   Nocturnal hypoxemia 07/09/2014   SOB (shortness of breath) 05/15/2014   Pulmonary HTN (HCC) 05/15/2014   Band keratopathy 01/16/2014   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   Multinodular goiter 10/14/2011   Atrophy, Fuchs' 09/29/2011   ATRIAL FIBRILLATION 06/03/2010   VITILIGO 11/07/2009   SINOATRIAL NODE DYSFUNCTION 06/16/2009   Atrial flutter (HCC) 06/25/2008   HYPERLIPIDEMIA 01/29/2008   Coronary atherosclerosis 01/29/2008   HTN (hypertension) 10/30/2007   PAST MEDICAL HISTORY:  Active Ambulatory Problems    Diagnosis Date Noted   HYPERLIPIDEMIA 01/29/2008   HTN (hypertension) 10/30/2007   Coronary atherosclerosis 01/29/2008   ATRIAL FIBRILLATION 06/03/2010   Atrial flutter (HCC) 06/25/2008   SINOATRIAL NODE DYSFUNCTION 06/16/2009   VITILIGO 11/07/2009   Multinodular goiter 10/14/2011   SOB (shortness of breath) 05/15/2014   Pulmonary HTN (HCC) 05/15/2014   Nocturnal hypoxemia 07/09/2014   Chronic diastolic heart failure (HCC) 07/30/2014   OSA  08/28/2014   Acute on chronic combined systolic (congestive) and diastolic (congestive) heart failure (HCC)    Diabetes (HCC) 03/07/2016   Rash and nonspecific skin eruption 06/09/2016   Primary osteoarthritis of right knee 06/27/2016   Band keratopathy 01/16/2014   Atrophy, Fuchs' 09/29/2011   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   Granuloma annulare 07/27/2016   Numbness of fingers  07/27/2016   COPD GOLD I  01/27/2017   Lumbar radiculopathy, acute 01/27/2017   Piriformis syndrome of left side 02/16/2017   Acquired leg length discrepancy 02/16/2017   Osteopenia 02/17/2017   Arthritis of left sacroiliac joint 05/04/2017   Osteoarthritis 08/01/2019   Chronic kidney disease (CKD) 02/20/2020   PAD (peripheral artery disease) (HCC) 02/29/2020   Neuropathy 08/19/2020   Poorly controlled type 2 diabetes mellitus with circulatory disorder (HCC) 01/31/2021   Chronic respiratory failure with hypoxia (HCC) 05/12/2021   Post-viral cough syndrome 01/17/2023   Palliative care encounter 01/17/2023   Bilateral impacted cerumen 11/25/2020   Bilateral sensorineural hearing loss 01/12/2022   Seborrheic keratoses 01/18/2023   Constipation 01/18/2023   URI (upper respiratory infection) 01/18/2023   Resolved Ambulatory Problems    Diagnosis Date Noted   DIABETES MELLITUS, CONTROLLED 02/10/2010   Hypopotassemia 02/05/2009   Iron deficiency anemia 01/02/2009   ANEMIA, PERNICIOUS 11/20/2008   DYSTROPHY, ENDOTHELIAL CORNEAL 05/12/2007   CHF 02/17/2010   DEGENERATIVE JOINT DISEASE 11/20/2008   HIP PAIN, LEFT 09/23/2010   LUMBAR RADICULOPATHY, RIGHT 09/23/2010   MUSCLE PAIN 06/15/2010   FATIGUE 12/24/2008   DYSPNEA/SHORTNESS OF BREATH 12/24/2008   URINARY INCONTINENCE 06/15/2010   Full incontinence of feces 06/15/2010   Hyperthyroidism 11/04/2011   Crescendo angina (HCC) 05/02/2014   DJD (degenerative joint  disease) of knee 08/16/2014   Acute on chronic respiratory failure with hypoxia (HCC) 05/24/2015   Multifocal pneumonia 05/24/2015   Dehydration 05/24/2015   Abnormal urinalysis 05/24/2015   Sepsis (HCC) 05/24/2015   Acute encephalopathy    Oral thrush    Acute renal failure superimposed on stage 3 chronic kidney disease (HCC)    Arthritis of right lower extremity 10/07/2015   Pain of left lower extremity 06/09/2016   Pre-operative cardiovascular examination 06/11/2016    Epiphora due to insufficient drainage 06/13/2013   Arthritis of right knee 06/28/2016   Rash 01/27/2017   COPD exacerbation (HCC) 05/21/2017   Abdominal cramping 01/31/2019   Difficulty sleeping 08/01/2019   Foot pain 02/20/2020   Unstable angina (HCC) 05/15/2020   Acute respiratory failure with hypoxia and hypercapnia (HCC) 01/31/2021   Acute on chronic diastolic CHF (congestive heart failure) (HCC) 01/31/2021   Elevated troponin 01/31/2021   Acute bacterial conjunctivitis of both eyes 04/15/2022   Past Medical History:  Diagnosis Date   Anemia    Anginal pain (HCC)    Arthritis    Asthma    Atrial fibrillation or flutter    CAD (coronary artery disease)    CAP (community acquired pneumonia) 05/24/2015   CHF (congestive heart failure) (HCC)    COPD (chronic obstructive pulmonary disease) (HCC)    Degenerative joint disease    Dystrophy, corneal stromal    Heart murmur    HLD (hyperlipidemia)    Muscle pain    Myocardial infarction (HCC) 2008   Osteoporosis    Pneumonia 05/27/2015   Protein calorie malnutrition (HCC)    Seasonal allergies    Shortness of breath dyspnea    Type II diabetes mellitus (HCC)    SOCIAL HX:  Social History   Tobacco Use   Smoking status: Former    Packs/day: 0.25    Years: 18.00    Additional pack years: 0.00    Total pack years: 4.50    Types: Cigarettes    Quit date: 12/27/1968    Years since quitting: 54.3   Smokeless tobacco: Never   Tobacco comments:    smoked 1952- 1970, up to 1 pp week  Substance Use Topics   Alcohol use: Yes    Comment: occ   FAMILY HX:  Family History  Problem Relation Age of Onset   Diabetes Mother    Hypertension Mother    Transient ischemic attack Mother    Arthritis Mother    Heart attack Father 79   Arthritis Father    Hypertension Father    Breast cancer Maternal Aunt    Arthritis Maternal Aunt        Preferred Pharmacy: ALLERGIES:  Allergies  Allergen Reactions   Levaquin  [Levofloxacin] Shortness Of Breath and Swelling    angioedema   Lobster [Shellfish Allergy] Other (See Comments)    angioedema   Penicillins Rash    Has patient had a PCN reaction causing immediate rash, facial/tongue/throat swelling, SOB or lightheadedness with hypotension: Yes Has patient had a PCN reaction causing severe rash involving mucus membranes or skin necrosis: No Has patient had a PCN reaction that required hospitalization: No Has patient had a PCN reaction occurring within the last 10 years: No If all of the above answers are "NO", then may proceed with Cephalosporin use.   Valsartan Other (See Comments)    REACTION: angioedema   Codeine Other (See Comments)    Mental status changes   Lipitor [Atorvastatin] Other (See Comments)  weakness   Oxycodone Other (See Comments)    hallucinations   Statins     Made her too weak   Tramadol    Amlodipine Besylate Other (See Comments)    REACTION: tingling in lips & gum edema 7/12: talked to patient, states she is tolerating well   Crestor [Rosuvastatin Calcium] Rash   Tramadol Hcl Nausea And Vomiting     PERTINENT MEDICATIONS:  Outpatient Encounter Medications as of 05/05/2023  Medication Sig   acetaminophen (TYLENOL) 325 MG tablet Take 2 tablets (650 mg total) by mouth every 6 (six) hours as needed for mild pain or headache.   amLODipine (NORVASC) 5 MG tablet Take 1 tablet (5 mg total) by mouth daily.   Blood Glucose Monitoring Suppl (ONETOUCH VERIO) w/Device KIT Use as instructed to check blood sugar   carvedilol (COREG) 6.25 MG tablet TAKE 1 TABLET BY MOUTH TWICE  DAILY WITH MEALS   ELIQUIS 5 MG TABS tablet TAKE 1 TABLET BY MOUTH  TWICE DAILY   insulin aspart protamine- aspart (NOVOLOG MIX 70/30) (70-30) 100 UNIT/ML injection Inject 14 Units into the skin daily with breakfast. 12 units in the PM   Lancet Devices (LANCING DEVICE) MISC Use as advised - for Delica Lancets   Lancets (ONETOUCH DELICA PLUS LANCET33G) MISC USE TO  MONITOR GLUCOSE  LEVELS TWICE DAILY   Multiple Vitamins-Minerals (CENTRUM SILVER ULTRA WOMENS PO) Take 1 tablet by mouth daily.   nitroGLYCERIN (NITROSTAT) 0.4 MG SL tablet Place 1 tablet (0.4 mg total) under the tongue every 5 (five) minutes as needed for chest pain. Reported on 01/28/2016   NOVOFINE PLUS PEN NEEDLE 32G X 4 MM MISC SMARTSIG:1 Each SUB-Q Daily   ONETOUCH VERIO test strip CHECK BLOOD SUGAR TWICE  DAILY   OXYGEN Inhale 3 Doses into the lungs as needed. 3 liters   potassium chloride SA (KLOR-CON M) 20 MEQ tablet TAKE 2 TABLETS BY MOUTH  DAILY   Semaglutide,0.25 or 0.5MG /DOS, (OZEMPIC, 0.25 OR 0.5 MG/DOSE,) 2 MG/1.5ML SOPN Inject 0.5 mg into the skin once a week.   torsemide (DEMADEX) 20 MG tablet TAKE 2 TABLETS BY MOUTH  TWICE DAILY   Vitamin D, Cholecalciferol, 25 MCG (1000 UT) CAPS Take 1 capsule by mouth daily.   No facility-administered encounter medications on file as of 05/05/2023.    History obtained from review of EMR, discussion with patient.   CBC    Component Value Date/Time   WBC 5.7 01/18/2023 1143   RBC 4.93 01/18/2023 1143   HGB 15.1 (H) 01/18/2023 1143   HCT 46.2 (H) 01/18/2023 1143   PLT 164.0 01/18/2023 1143   MCV 93.8 01/18/2023 1143   MCH 30.1 12/17/2021 1205   MCHC 32.6 01/18/2023 1143   RDW 13.9 01/18/2023 1143   LYMPHSABS 0.9 01/18/2023 1143   MONOABS 0.6 01/18/2023 1143   EOSABS 0.1 01/18/2023 1143   BASOSABS 0.0 01/18/2023 1143    CMP    Latest Ref Rng & Units 01/18/2023    3:50 PM 12/17/2021   12:05 PM 02/05/2021    2:15 AM  CMP  Glucose 70 - 99 mg/dL 604  540  981   BUN 6 - 23 mg/dL 34  31  16   Creatinine 0.40 - 1.20 mg/dL 1.91  4.78  2.95   Sodium 135 - 145 mEq/L 138  140  139   Potassium 3.5 - 5.1 mEq/L 3.8  4.4  3.7   Chloride 96 - 112 mEq/L 94  99  92  CO2 19 - 32 mEq/L 37  31  37   Calcium 8.4 - 10.5 mg/dL 16.1  09.6  9.7   Total Protein 6.0 - 8.3 g/dL 7.7   6.4   Total Bilirubin 0.2 - 1.2 mg/dL 0.5   0.9   Alkaline Phos 39  - 117 U/L 71   78   AST 0 - 37 U/L 22   20   ALT 0 - 35 U/L 15   18     LFTs    Latest Ref Rng & Units 01/18/2023    3:50 PM 02/05/2021    2:15 AM 02/02/2021   12:59 AM  Hepatic Function  Total Protein 6.0 - 8.3 g/dL 7.7  6.4  6.0   Albumin 3.5 - 5.2 g/dL 4.0  3.0  2.8   AST 0 - 37 U/L 22  20  23    ALT 0 - 35 U/L 15  18  15    Alk Phosphatase 39 - 117 U/L 71  78  72   Total Bilirubin 0.2 - 1.2 mg/dL 0.5  0.9  1.3      I reviewed available labs, medications, imaging, studies and related documents from the EMR.  Records reviewed and summarized above.   Physical Exam: GENERAL: NAD LUNGS: CTAB, no increased work of breathing, room air CARDIAC:  S1S2, IRIR with soft LUSB murmur, No edema/cyanosis ABD:  Normo-active BS x 4 quads, soft, non-tender EXTREMITIES: Normal ROM, no deformity, strength equal, No muscle atrophy/subcutaneous fat loss NEURO:  No weakness or cognitive impairment PSYCH:  non-anxious affect, A & O x 3  Thank you for the opportunity to participate in the care of [employer] [facility]. Please call our main office at [phone] if we can be of additional assistance.    Joycelyn Man FNP-C  [email or web address] .[facility]  Phone:  [phone]

## 2023-05-08 ENCOUNTER — Encounter: Payer: Self-pay | Admitting: Family Medicine

## 2023-05-08 DIAGNOSIS — I5042 Chronic combined systolic (congestive) and diastolic (congestive) heart failure: Secondary | ICD-10-CM | POA: Insufficient documentation

## 2023-06-02 ENCOUNTER — Ambulatory Visit (HOSPITAL_BASED_OUTPATIENT_CLINIC_OR_DEPARTMENT_OTHER): Payer: Medicare Other | Admitting: Pulmonary Disease

## 2023-06-02 ENCOUNTER — Encounter (HOSPITAL_BASED_OUTPATIENT_CLINIC_OR_DEPARTMENT_OTHER): Payer: Self-pay | Admitting: Pulmonary Disease

## 2023-06-02 VITALS — BP 126/68 | HR 75 | Ht 63.0 in | Wt 153.0 lb

## 2023-06-02 DIAGNOSIS — G4733 Obstructive sleep apnea (adult) (pediatric): Secondary | ICD-10-CM

## 2023-06-02 DIAGNOSIS — J9611 Chronic respiratory failure with hypoxia: Secondary | ICD-10-CM | POA: Diagnosis not present

## 2023-06-02 DIAGNOSIS — J432 Centrilobular emphysema: Secondary | ICD-10-CM | POA: Diagnosis not present

## 2023-06-02 DIAGNOSIS — G4734 Idiopathic sleep related nonobstructive alveolar hypoventilation: Secondary | ICD-10-CM | POA: Diagnosis not present

## 2023-06-02 DIAGNOSIS — I272 Pulmonary hypertension, unspecified: Secondary | ICD-10-CM

## 2023-06-02 MED ORDER — FLUTICASONE PROPIONATE 50 MCG/ACT NA SUSP: 1.0000 | Freq: Every day | NASAL | 2 refills | Status: DC | PRN

## 2023-06-02 MED ORDER — AZELASTINE HCL 0.15 % NA SOLN: 1.0000 | Freq: Every day | NASAL | Status: DC | PRN

## 2023-06-02 NOTE — Patient Instructions (Signed)
Will arrange for overnight oxygen test on room air  You can try flonase or astepro to help with sinus congestion and runny nose  Follow up in 1 year

## 2023-06-02 NOTE — Progress Notes (Signed)
[name]  Chief Complaint  Patient presents with   Follow-up    F/U. States she has using 2-3L of O2 only as needed with exertion. States her breathing has been stable since last visit.     Past Surgical History:  She  has a past surgical history that includes Cholecystectomy; Knee cartilage surgery (Right); Eye surgery; A flutter ablation; gravid 2 para 2; Colonoscopy (6/07); Coronary angioplasty with stent (02/2007); colonoscopy with polypectomy; left and right heart catheterization with coronary angiogram (N/A, 05/07/2014); Cataract extraction w/ intraocular lens  implant, bilateral (Bilateral); Corneal transplant (Bilateral); Total knee arthroplasty (Right, 06/28/2016); ABDOMINAL AORTOGRAM W/LOWER EXTREMITY (Bilateral, 04/11/2020); PERIPHERAL VASCULAR INTERVENTION (04/11/2020); RIGHT/LEFT HEART CATH AND CORONARY ANGIOGRAPHY (N/A, 05/16/2020); CORONARY STENT INTERVENTION (N/A, 05/16/2020); Coronary Ultrasound/IVUS (N/A, 05/16/2020); CORONARY ATHERECTOMY (N/A, 05/16/2020); CORONARY BALLOON ANGIOPLASTY (N/A, 05/16/2020); and RIGHT HEART CATH (N/A, 02/04/2021).  Past Medical History:  A fib, CAD, HTN, HLD, Chronic diastolic dysfx, COPD, DM, Allergies  Constitutional:  BP 126/68   Pulse 75   Ht 5\' 3"  (1.6 m)   Wt 153 lb (69.4 kg)   SpO2 92% Comment: on ra  BMI 27.10 kg/m   Brief Summary:  [name] is a 89 y.o. female former smoker with obstructive sleep apnea and COPD with emphysema.      Subjective:   She is frustrated with service from [employer].  She would like to switch DMEs if possible and get an [employer] POC.  She also is wondering if she still needs oxygen at night.  She gets sinus congestion, runny nose and post nasal drip sometimes.  Not having chest congestion, cough, or wheeze.  She has been doing breathing exercises, Tai Chi, and mediation.  These help.  She uses her oxygen with walking at her living facility.  Her SpO2 dropped  to 86% on room air while walking today.  She was recovered on 2 liters to SpO2 > 92%.  Physical Exam:   Appearance - well kempt   ENMT - no sinus tenderness, no oral exudate, no LAN, Mallampati 2 airway, no stridor  Respiratory - equal breath sounds bilaterally, no wheezing or rales  CV - s1s2 regular rate and rhythm, no murmurs  Ext - no clubbing, no edema  Skin - no rashes  Psych - normal mood and affect    Pulmonary testing:  PFT 05/21/14 >> FEV1 1.37 (94%), FEV1% 66, TLC 4.69 (95%), DLCO 39%  Chest Imaging:  CT chest 05/24/15 >> mild centrilobular emphysema CT chest 05/25/17 >> moderate centrilobular emphysema, atherosclerosis CT angio chest 01/31/21 >> atherosclerosis, small HH, upper lobe emphysema  Sleep Tests:  PSG 7/14//15 >> AHI 37 HST 02/21/17 >> AHI 30.5, SaO2 low 78% Auto CPAP 10/23/17 to 11/21/17 >> used on 30 of 30 nights with average 6 hrs 34 min.  Average AHI 1 with median CPAP 8 and 95 th percentile CPAP 11 cm H2O HST 03/14/20 >> AHI 15.3, SpO low 79% Auto CPAP 09/07/21 to 10/06/21 >> used on 30 of 30 nights with average 4 hrs 48 min.  Average AHI 3.7 with median CPAP 8 and 95 th percentile CPAP 12 cm H2O  Cardiac Tests:  Echo 01/31/21 >> EF 40 to 45%, mod LVH, mod LA dilation, mild MR RHC 02/04/21 >> RA 6, RV 88/5, PA 86/33 (53), CI 1.9, PVR 9.4 WU  Social History:  She  reports that she quit smoking about 54 years ago. Her smoking use included cigarettes. She has a  4.50 pack-year smoking history. She has never used smokeless tobacco. She reports current alcohol use. She reports that she does not use drugs.  Family History:  Her family history includes Arthritis in her father, maternal aunt, and mother; Breast cancer in her maternal aunt; Diabetes in her mother; Heart attack (age of onset: 82) in her father; Hypertension in her father and mother; Transient ischemic attack in her mother.     Assessment/Plan:   Obstructive sleep apnea. - she is intolerant of  CPAP   COPD with emphysema. - minimal symptoms - she doesn't feel like she needs inhaler therapy at present  Chronic respiratory failure with hypoxia. - discussed importance of using supplemental oxygen as prescribed - goal SpO2 > 90% - continue 2 liters oxygen with exertion - will arrange for overnight oximetry on room air to determine if she still needs supplemental oxygen at night - depending on this result will determine her oxygen set up needs and see if she can switch to an [employer] device  Allergic rhinitis. - prn flonase or astepro  Chronic diastolic CHF, Coronary artery disease s/p stent, Paroxysmal atrial fib/flutter. - followed by Dr. Arvilla Meres with cardiology  Goals of care. - DNR/DNI  Time Spent Involved in Patient Care on Day of Examination:  36 minutes  Follow up:   Patient Instructions  Will arrange for overnight oxygen test on room air  You can try flonase or astepro to help with sinus congestion and runny nose  Follow up in 1 year  Medication List:   Allergies as of 06/02/2023       Reactions   Levaquin [levofloxacin] Shortness Of Breath, Swelling   angioedema   Lobster [shellfish Allergy] Other (See Comments)   angioedema   Penicillins Rash   Has patient had a PCN reaction causing immediate rash, facial/tongue/throat swelling, SOB or lightheadedness with hypotension: Yes Has patient had a PCN reaction causing severe rash involving mucus membranes or skin necrosis: No Has patient had a PCN reaction that required hospitalization: No Has patient had a PCN reaction occurring within the last 10 years: No If all of the above answers are "NO", then may proceed with Cephalosporin use.   Valsartan Other (See Comments)   REACTION: angioedema   Codeine Other (See Comments)   Mental status changes   Lipitor [atorvastatin] Other (See Comments)   weakness   Oxycodone Other (See Comments)   hallucinations   Statins    Made her too weak   Tramadol     Amlodipine Besylate Other (See Comments)   REACTION: tingling in lips & gum edema 7/12: talked to patient, states she is tolerating well   Crestor [rosuvastatin Calcium] Rash   Tramadol Hcl Nausea And Vomiting        Medication List        Accurate as of June 02, 2023 10:42 AM. If you have any questions, ask your nurse or doctor.          acetaminophen 325 MG tablet Commonly known as: TYLENOL Take 2 tablets (650 mg total) by mouth every 6 (six) hours as needed for mild pain or headache.   amLODipine 5 MG tablet Commonly known as: NORVASC Take 1 tablet (5 mg total) by mouth daily.   Azelastine HCl 0.15 % Soln Commonly known as: Astepro Place 1 spray into the nose daily as needed. Started by: Coralyn Helling, MD   carvedilol 6.25 MG tablet Commonly known as: COREG TAKE 1 TABLET BY MOUTH TWICE  DAILY WITH  MEALS   CENTRUM SILVER ULTRA WOMENS PO Take 1 tablet by mouth daily.   Eliquis 5 MG Tabs tablet Generic drug: apixaban TAKE 1 TABLET BY MOUTH  TWICE DAILY   fluticasone 50 MCG/ACT nasal spray Commonly known as: FLONASE Place 1 spray into both nostrils daily as needed for allergies or rhinitis. Started by: Coralyn Helling, MD   insulin aspart protamine- aspart (70-30) 100 UNIT/ML injection Commonly known as: NOVOLOG MIX 70/30 Inject 14 Units into the skin daily with breakfast. 12 units in the PM   Lancing Device Misc Use as advised - for Delica Lancets   nitroGLYCERIN 0.4 MG SL tablet Commonly known as: NITROSTAT Place 1 tablet (0.4 mg total) under the tongue every 5 (five) minutes as needed for chest pain. Reported on 01/28/2016   NovoFine Plus Pen Needle 32G X 4 MM Misc Generic drug: Insulin Pen Needle SMARTSIG:1 Each SUB-Q Daily   OneTouch Delica Plus Lancet33G Misc USE TO MONITOR GLUCOSE  LEVELS TWICE DAILY   OneTouch Verio test strip Generic drug: glucose blood CHECK BLOOD SUGAR TWICE  DAILY   OneTouch Verio w/Device Kit Use as instructed to check blood  sugar   OXYGEN Inhale 3 Doses into the lungs as needed. 3 liters   Ozempic (0.25 or 0.5 MG/DOSE) 2 MG/1.5ML Sopn Generic drug: Semaglutide(0.25 or 0.5MG /DOS) Inject 0.5 mg into the skin once a week.   potassium chloride SA 20 MEQ tablet Commonly known as: KLOR-CON M TAKE 2 TABLETS BY MOUTH  DAILY   torsemide 20 MG tablet Commonly known as: DEMADEX TAKE 2 TABLETS BY MOUTH  TWICE DAILY   Vitamin D (Cholecalciferol) 25 MCG (1000 UT) Caps Take 1 capsule by mouth daily.        Signature:  Coralyn Helling, MD [facility] Pulmonary/Critical Care Pager - [phone] 06/02/2023, 10:42 AM

## 2023-06-13 ENCOUNTER — Other Ambulatory Visit (HOSPITAL_COMMUNITY): Payer: Self-pay | Admitting: Internal Medicine

## 2023-06-13 ENCOUNTER — Encounter (HOSPITAL_BASED_OUTPATIENT_CLINIC_OR_DEPARTMENT_OTHER): Payer: Self-pay | Admitting: Pulmonary Disease

## 2023-06-15 NOTE — Telephone Encounter (Signed)
It looks like [facility] was handling the ONO order her initials are on the order

## 2023-06-15 NOTE — Telephone Encounter (Signed)
ONO was ordered on 06/02/23- usually I can tell where the order was sent but in this case I can not. Can you tell us who she can call about her order? Thanks!

## 2023-06-16 NOTE — Telephone Encounter (Signed)
ONO order sent to [facility]   Awaiting response. Will send again

## 2023-06-21 ENCOUNTER — Other Ambulatory Visit (HOSPITAL_COMMUNITY): Payer: Self-pay | Admitting: Internal Medicine

## 2023-06-21 ENCOUNTER — Other Ambulatory Visit: Payer: Self-pay | Admitting: Internal Medicine

## 2023-06-21 DIAGNOSIS — E1122 Type 2 diabetes mellitus with diabetic chronic kidney disease: Secondary | ICD-10-CM

## 2023-06-23 ENCOUNTER — Other Ambulatory Visit: Payer: Self-pay | Admitting: Internal Medicine

## 2023-06-23 ENCOUNTER — Encounter: Payer: Self-pay | Admitting: Internal Medicine

## 2023-06-23 ENCOUNTER — Ambulatory Visit: Payer: Medicare Other | Admitting: Internal Medicine

## 2023-06-23 VITALS — BP 140/80 | HR 76 | Ht 63.0 in | Wt 151.8 lb

## 2023-06-23 DIAGNOSIS — E1122 Type 2 diabetes mellitus with diabetic chronic kidney disease: Secondary | ICD-10-CM | POA: Diagnosis not present

## 2023-06-23 DIAGNOSIS — Z7985 Long-term (current) use of injectable non-insulin antidiabetic drugs: Secondary | ICD-10-CM | POA: Diagnosis not present

## 2023-06-23 DIAGNOSIS — Z794 Long term (current) use of insulin: Secondary | ICD-10-CM | POA: Diagnosis not present

## 2023-06-23 DIAGNOSIS — E782 Mixed hyperlipidemia: Secondary | ICD-10-CM

## 2023-06-23 DIAGNOSIS — N1831 Chronic kidney disease, stage 3a: Secondary | ICD-10-CM | POA: Diagnosis not present

## 2023-06-23 DIAGNOSIS — E669 Obesity, unspecified: Secondary | ICD-10-CM

## 2023-06-23 DIAGNOSIS — E119 Type 2 diabetes mellitus without complications: Secondary | ICD-10-CM

## 2023-06-23 DIAGNOSIS — Z6826 Body mass index (BMI) 26.0-26.9, adult: Secondary | ICD-10-CM

## 2023-06-23 LAB — HEMOGLOBIN A1C: Hemoglobin A1C: 6.9

## 2023-06-23 MED ORDER — NOVOLOG FLEXPEN 100 UNIT/ML ~~LOC~~ SOPN: 4.0000 [IU] | PEN_INJECTOR | Freq: Three times a day (TID) | SUBCUTANEOUS | 3 refills | Status: DC

## 2023-06-23 MED ORDER — LANTUS SOLOSTAR 100 UNIT/ML ~~LOC~~ SOPN: 15.0000 [IU] | PEN_INJECTOR | Freq: Every day | SUBCUTANEOUS | 3 refills | Status: DC

## 2023-06-23 NOTE — Progress Notes (Signed)
Patient ID: [name], female   DOB: 19-Nov-1931, 89 y.o.   MRN: [id number]  HPI: [name] is a 89 y.o.-year-old female, self-referred (her daughter is also my patient), for management of DM2, dx in 1994, insulin-dependent, uncontrolled, with complications (CAD, s/p NSTEMI 2008; CHF; PAD; atrial fibrillation/atrial flutter; peripheral neuropathy; CKD stage IIIa).   Last visit with me 4 months ago.  Patient's daughter accompanies her today and offers information about patient's diet, activity, and insulin doses.  Interim history: No increased urination, blurry vision, nausea, chest pain.   She tells me she forgets to eat and to check CBGs.  She was having some low blood sugars since last visit.  She feels like her diabetes is a full-time job. She had some dietary indiscretions.  Reviewed HbA1c: Lab Results  Component Value Date   HGBA1C 7.3 (A) 02/03/2023   HGBA1C 7.0 (A) 07/29/2022   HGBA1C 8.4 (A) 03/26/2022   HGBA1C 7.3 (A) 12/25/2021   HGBA1C 9.0 (A) 09/14/2021   HGBA1C 8.3 (A) 05/28/2021   HGBA1C 8.0 (A) 03/24/2021   HGBA1C 9.5 (A) 12/02/2020   HGBA1C 7.8 (A) 08/21/2020   HGBA1C 8.3 (H) 05/15/2020   HGBA1C 8.0 (A) 05/06/2020   HGBA1C 9.2 (H) 02/20/2020   HGBA1C 7.9 (A) 11/14/2019   HGBA1C 7.6 (A) 08/22/2019   HGBA1C 8.1 (A) 05/01/2019   HGBA1C 8.9 (H) 01/31/2019   HGBA1C 7.0 (A) 12/06/2018   HGBA1C 6.5 (A) 09/20/2018   HGBA1C 6.4 (A) 07/19/2018   HGBA1C 7.3 05/08/2018   HGBA1C 8.0 (H) 01/30/2018   HGBA1C 8.3 11/23/2017   HGBA1C 7.4 08/23/2017   HGBA1C 8.2 01/24/2017   HGBA1C 7.7 09/08/2016   HGBA1C 8.4 (H) 06/09/2016   HGBA1C 7.8 (H) 03/04/2016   HGBA1C 7.6 12/08/2015   HGBA1C 8.3 08/26/2015   HGBA1C 7.9 (H) 05/24/2015   HGBA1C 8.6 (H) 02/06/2015   HGBA1C 8.7 (H) 11/15/2014   HGBA1C 8.1 (H) 08/21/2014   HGBA1C 9.6 (H) 01/21/2014   HGBA1C 9.8 (H) 09/13/2013   HGBA1C 9.7 (H) 05/15/2013   HGBA1C 10.9 (H) 02/01/2013   HGBA1C 8.9 (H) 09/26/2012    HGBA1C 9.9 (H) 05/29/2012   HGBA1C 8.1 (H) 09/06/2011   Pt is on a regimen of: - Humalog 75/25 >> 70/30 Novolin (24$ at [facility], w/o Rx) 32 units before breakfast and 6 units before dinner >> .Marland KitchenMarland Kitchen 14 units 30 min before breakfast and 10-12 units 30 min before dinner - Ozempic 0.25 >> 0.5 mg weekly-added 08/2021 >> stopped 12/2021 2/2 $$ but now new insurance >> restarted (patient assistance)  Pt checks her sugars 2-4x a day and they are: - am:  104-184, 215 >> 89-158, 164, 186 >> 92-170 >> 96-130, 188 - 2h after b'fast: n/c >> 154 >> 172 >> n/c - before lunch: 125-194, 220 >> 248 >> 189 >> 94-179 >> 90-130s - 2h after lunch: n/c  >> 148 >> 45 - before dinner: 129, 144 >> 78-118, 178. 199  >> 116-140, 216 >> 90-130s - 2h after dinner:  70, 159-187 >> 420 >> 171, 198 >> n/c >> 165 >> 200's - bedtime: 61, 100, 181>> 110-209, 274, 497 >> 130, 389, 497 >> n/c - nighttime: n/c >> 93-227 >> 186-286 >>  Lowest sugar was 64 >> 51 >> 104 >> 59 >> 92 >> 45; she has hypoglycemia awareness at 70.  Highest sugar was 270 >> 260 >> 420 >> 497 (steroids) >> 250.  Glucometer: One Touch Verio IQ  Pt's meals are: -  Breakfast: frozen banana, berries, oatmeal, protein powder >> yogurt and fruit - dinner: Includes soup  But she has lunch and dinner in the cafeteria [employer] [employer] [facility] - facility: [place]):   -+ History of CKD stage IIIa, last BUN/creatinine:  Lab Results  Component Value Date   BUN 34 (H) 01/18/2023   BUN 31 (H) 12/17/2021   CREATININE 0.99 01/18/2023   CREATININE 1.24 (H) 12/17/2021  She is not on an ACE inhibitor/ARB  -+ HL; last set of lipids: Lab Results  Component Value Date   CHOL 315 (H) 01/18/2023   HDL 54.10 01/18/2023   LDLCALC 229 (H) 01/18/2023   LDLDIRECT 144.6 02/03/2010   TRIG 161.0 (H) 01/18/2023   CHOLHDL 6 01/18/2023  She had muscle weakness from statins.   - last eye exam was in 2023 (?): No DR reportedly.  - no numbness and tingling in her  feet.  She saw podiatry, but not recently.  Last foot exam 02/15/2023.  Pt has FH of DM in mother.  She also has a history of HTN, osteoporosis, protein-calorie malnutrition, iron deficiency anemia. No FH of MTC, no pancreatitis hx.  ROS: + See HPI  Past Medical History:  Diagnosis Date   Anemia    iron defficiency   Anginal pain (HCC)    Arthritis    "knees, feet, hands; joints" (05/27/2015)   Asthma    Atrial fibrillation or flutter    s/p RFCA 7/08;   s/p DCCV in past;   previously on amiodarone;  amio stopped due to lung toxicity   CAD (coronary artery disease)    s/p NSTEMI tx with BMS to OM1 3/08;  cath 3/08: pOM 99% tx with PCI, pLAD 20%, ? mod stenosis at the AM   CAP (community acquired pneumonia) 05/24/2015   CHF (congestive heart failure) (HCC)    Chronic diastolic heart failure (HCC)    echo 11/11:  EF 55-60%, severe LVH, mod LAE, mild MR, mildly increased PASP   COPD (chronic obstructive pulmonary disease) (HCC)    Degenerative joint disease    Dystrophy, corneal stromal    Heart murmur    HLD (hyperlipidemia)    HTN (hypertension)    essential nos   Hypopotassemia    PMH of   Muscle pain    Myocardial infarction (HCC) 2008   OSA on CPAP    Osteoporosis    Pneumonia 05/27/2015   Protein calorie malnutrition (HCC)    Rash and nonspecific skin eruption    both arms,awaiting bio   dr Emily Filbert   Seasonal allergies    Shortness of breath dyspnea    Type II diabetes mellitus (HCC)    Dr Everardo All   Past Surgical History:  Procedure Laterality Date   A FLUTTER ABLATION     Dr Ladona Ridgel   ABDOMINAL AORTOGRAM W/LOWER EXTREMITY Bilateral 04/11/2020   Procedure: ABDOMINAL AORTOGRAM W/LOWER EXTREMITY;  Surgeon: Chuck Hint, MD;  Location: [facility] INVASIVE CV LAB;  Service: Cardiovascular;  Laterality: Bilateral;   CATARACT EXTRACTION W/ INTRAOCULAR LENS  IMPLANT, BILATERAL Bilateral    CHOLECYSTECTOMY     COLONOSCOPY  6/07   2 polyps Dr. Elvina Mattes in [place]   colonoscopy  with polypectomy      X 2; [facility]   CORNEAL TRANSPLANT Bilateral    CORONARY ANGIOPLASTY WITH STENT PLACEMENT  02/2007   BMS w/Dr Ladona Ridgel   CORONARY ATHERECTOMY N/A 05/16/2020   Procedure: CORONARY ATHERECTOMY;  Surgeon: Swaziland, Peter M, MD;  Location: [facility] INVASIVE  CV LAB;  Service: Cardiovascular;  Laterality: N/A;   CORONARY BALLOON ANGIOPLASTY N/A 05/16/2020   Procedure: CORONARY BALLOON ANGIOPLASTY;  Surgeon: Swaziland, Peter M, MD;  Location: [facility] INVASIVE CV LAB;  Service: Cardiovascular;  Laterality: N/A;   CORONARY STENT INTERVENTION N/A 05/16/2020   Procedure: CORONARY STENT INTERVENTION;  Surgeon: Swaziland, Peter M, MD;  Location: [facility] INVASIVE CV LAB;  Service: Cardiovascular;  Laterality: N/A;   CORONARY ULTRASOUND/IVUS N/A 05/16/2020   Procedure: Intravascular Ultrasound/IVUS;  Surgeon: Swaziland, Peter M, MD;  Location: [facility] INVASIVE CV LAB;  Service: Cardiovascular;  Laterality: N/A;   EYE SURGERY     gravid 2 para 2     KNEE CARTILAGE SURGERY Right    LEFT AND RIGHT HEART CATHETERIZATION WITH CORONARY ANGIOGRAM N/A 05/07/2014   Procedure: LEFT AND RIGHT HEART CATHETERIZATION WITH CORONARY ANGIOGRAM;  Surgeon: Peter M Swaziland, MD;  Location: [facility] CATH LAB;  Service: Cardiovascular;  Laterality: N/A;   PERIPHERAL VASCULAR INTERVENTION  04/11/2020   Procedure: PERIPHERAL VASCULAR INTERVENTION;  Surgeon: Chuck Hint, MD;  Location: [facility] INVASIVE CV LAB;  Service: Cardiovascular;;   RIGHT HEART CATH N/A 02/04/2021   Procedure: RIGHT HEART CATH;  Surgeon: Dolores Patty, MD;  Location: [facility] INVASIVE CV LAB;  Service: Cardiovascular;  Laterality: N/A;   RIGHT/LEFT HEART CATH AND CORONARY ANGIOGRAPHY N/A 05/16/2020   Procedure: RIGHT/LEFT HEART CATH AND CORONARY ANGIOGRAPHY;  Surgeon: Dolores Patty, MD;  Location: [facility] INVASIVE CV LAB;  Service: Cardiovascular;  Laterality: N/A;   TOTAL KNEE ARTHROPLASTY Right 06/28/2016   Procedure: TOTAL KNEE ARTHROPLASTY;  Surgeon: Gean Birchwood, MD;   Location: [facility] OR;  Service: Orthopedics;  Laterality: Right;   Social History   Socioeconomic History   Marital status: Widowed    Spouse name: Not on file   Number of children: 2   Years of education: 16   Highest education level: Bachelor's degree (e.g., BA, AB, BS)  Occupational History   Occupation: Teacher/ Retired  Tobacco Use   Smoking status: Former    Packs/day: 0.25    Years: 18.00    Additional pack years: 0.00    Total pack years: 4.50    Types: Cigarettes    Quit date: 12/27/1968    Years since quitting: 54.5   Smokeless tobacco: Never   Tobacco comments:    smoked 1952- 1970, up to 1 pp week  Vaping Use   Vaping Use: Never used  Substance and Sexual Activity   Alcohol use: Yes    Comment: occ   Drug use: No   Sexual activity: Never  Other Topics Concern   Not on file  Social History Narrative   Teacher. Widowed. Rarely drinks cafeine.    Social Determinants of Health   Financial Resource Strain: Low Risk  (09/06/2022)   Overall Financial Resource Strain (CARDIA)    Difficulty of Paying Living Expenses: Not hard at all  Food Insecurity: No Food Insecurity (09/06/2022)   Hunger Vital Sign    Worried About Running Out of Food in the Last Year: Never true    Ran Out of Food in the Last Year: Never true  Transportation Needs: No Transportation Needs (09/06/2022)   PRAPARE - Administrator, Civil Service (Medical): No    Lack of Transportation (Non-Medical): No  Physical Activity: Sufficiently Active (09/06/2022)   Exercise Vital Sign    Days of Exercise per Week: 5 days    Minutes of Exercise per Session: 30 min  Stress: No Stress Concern  Present (09/06/2022)   [employer] of Occupational Health - Occupational Stress Questionnaire    Feeling of Stress : Only a little  Social Connections: Socially Integrated (09/06/2022)   Social Connection and Isolation Panel [NHANES]    Frequency of Communication with Friends and Family: More than three  times a week    Frequency of Social Gatherings with Friends and Family: More than three times a week    Attends Religious Services: More than 4 times per year    Active Member of [employer] or Organizations: Yes    Attends Engineer, structural: More than 4 times per year    Marital Status: Married  Catering manager Violence: Not At Risk (09/06/2022)   Humiliation, Afraid, Rape, and Kick questionnaire    Fear of Current or Ex-Partner: No    Emotionally Abused: No    Physically Abused: No    Sexually Abused: No   Current Outpatient Medications on File Prior to Visit  Medication Sig Dispense Refill   acetaminophen (TYLENOL) 325 MG tablet Take 2 tablets (650 mg total) by mouth every 6 (six) hours as needed for mild pain or headache. 20 tablet 0   amLODipine (NORVASC) 5 MG tablet Take 1 tablet (5 mg total) by mouth daily. 90 tablet 3   apixaban (ELIQUIS) 5 MG TABS tablet TAKE 1 TABLET BY MOUTH TWICE  DAILY 180 tablet 0   Azelastine HCl (ASTEPRO) 0.15 % SOLN Place 1 spray into the nose daily as needed.     Blood Glucose Monitoring Suppl (ONETOUCH VERIO) w/Device KIT Use as instructed to check blood sugar 1 kit 0   carvedilol (COREG) 6.25 MG tablet TAKE 1 TABLET BY MOUTH TWICE  DAILY WITH MEALS 180 tablet 3   fluticasone (FLONASE) 50 MCG/ACT nasal spray Place 1 spray into both nostrils daily as needed for allergies or rhinitis. 16 g 2   insulin aspart protamine- aspart (NOVOLOG MIX 70/30) (70-30) 100 UNIT/ML injection Inject 14 Units into the skin daily with breakfast. 12 units in the PM     Lancet Devices (LANCING DEVICE) MISC Use as advised - for Delica Lancets 1 each 0   Lancets (ONETOUCH DELICA PLUS LANCET33G) MISC USE TO MONITOR GLUCOSE  LEVELS TWICE DAILY 200 each 3   Multiple Vitamins-Minerals (CENTRUM SILVER ULTRA WOMENS PO) Take 1 tablet by mouth daily.     nitroGLYCERIN (NITROSTAT) 0.4 MG SL tablet Place 1 tablet (0.4 mg total) under the tongue every 5 (five) minutes as needed for  chest pain. Reported on 01/28/2016 30 tablet 3   NOVOFINE PLUS PEN NEEDLE 32G X 4 MM MISC SMARTSIG:1 Each SUB-Q Daily 200 each 1   ONETOUCH VERIO test strip CHECK BLOOD SUGAR TWICE DAILY 200 strip 3   OXYGEN Inhale 3 Doses into the lungs as needed. 3 liters     potassium chloride SA (KLOR-CON M) 20 MEQ tablet TAKE 2 TABLETS BY MOUTH DAILY 180 tablet 3   Semaglutide,0.25 or 0.5MG /DOS, (OZEMPIC, 0.25 OR 0.5 MG/DOSE,) 2 MG/1.5ML SOPN Inject 0.5 mg into the skin once a week. 4.5 mL 3   torsemide (DEMADEX) 20 MG tablet TAKE 2 TABLETS BY MOUTH  TWICE DAILY 360 tablet 3   Vitamin D, Cholecalciferol, 25 MCG (1000 UT) CAPS Take 1 capsule by mouth daily.     No current facility-administered medications on file prior to visit.   Allergies  Allergen Reactions   Levaquin [Levofloxacin] Shortness Of Breath and Swelling    angioedema   Lobster [Shellfish Allergy] Other (See  Comments)    angioedema   Penicillins Rash    Has patient had a PCN reaction causing immediate rash, facial/tongue/throat swelling, SOB or lightheadedness with hypotension: Yes Has patient had a PCN reaction causing severe rash involving mucus membranes or skin necrosis: No Has patient had a PCN reaction that required hospitalization: No Has patient had a PCN reaction occurring within the last 10 years: No If all of the above answers are "NO", then may proceed with Cephalosporin use.   Valsartan Other (See Comments)    REACTION: angioedema   Codeine Other (See Comments)    Mental status changes   Lipitor [Atorvastatin] Other (See Comments)    weakness   Oxycodone Other (See Comments)    hallucinations   Statins     Made her too weak   Tramadol    Amlodipine Besylate Other (See Comments)    REACTION: tingling in lips & gum edema 7/12: talked to patient, states she is tolerating well   Crestor [Rosuvastatin Calcium] Rash   Tramadol Hcl Nausea And Vomiting   Family History  Problem Relation Age of Onset   Diabetes Mother     Hypertension Mother    Transient ischemic attack Mother    Arthritis Mother    Heart attack Father 22   Arthritis Father    Hypertension Father    Breast cancer Maternal Aunt    Arthritis Maternal Aunt    PE: BP (!) 140/80   Pulse 76   Ht 5\' 3"  (1.6 m)   Wt 151 lb 12.8 oz (68.9 kg)   SpO2 94%   BMI 26.89 kg/m  Wt Readings from Last 3 Encounters:  06/23/23 151 lb 12.8 oz (68.9 kg)  06/02/23 153 lb (69.4 kg)  02/17/23 153 lb 9.6 oz (69.7 kg)   Constitutional: Slightly overweight, in NAD Eyes: EOMI, no exophthalmos ENT: no thyromegaly, no cervical lymphadenopathy Cardiovascular: RRR, No MRG, B pitting LE edema - wears compression hoses Respiratory: CTA B Musculoskeletal: no deformities Skin: + stasis dermatitis rash - bilateral lower legs; wears compression hoses Neurological: no tremor with outstretched hands  ASSESSMENT: 1. DM2, insulin-dependent, uncontrolled, with complications - CAD, s/p NSTEMI 2008 - CHF - PAD - h/o R stent - atrial fibrillation/atrial flutter -previously on amiodarone - peripheral neuropathy - CKD stage IIIa  2. Obesity class 1  3. HL  PLAN:  1. Patient with longstanding, uncontrolled, type 2 diabetes, on the premixed insulin regimen, to which we added a GLP-1 receptor agonist, with improvement in blood sugars.  At last visit, however, HbA1c was 7.3%, higher than before.  Sugars were mostly at goal with only occasional hyperglycemic spikes.  We discussed about using 2 to 4 units extra doses of insulin as she was preparing to start steroids, but otherwise we did not change her regimen.  We did discuss about improving diet that she was having dietary indiscretions. -At today's visit, sugars appear to be mostly at goal reportedly, but she mentions that she forgets to eat and since she has Novolin in her system, her blood sugars can drop quite low, down to the 40s.  She was wondering whether a change in regimen is necessary.  We discussed about switching  to a basal-bolus insulin regimen, since her premixed insulin regimen is not flexible.  She agrees with this.  I advised her to take Lantus every day at bedtime, but take NovoLog only if she plans to eat.  We will start with lower doses and we will increase them as  needed.  Will continue Ozempic for now.  I did discuss with her that we may not need her NovoLog before breakfast and lunch if sugars remain controlled. - I suggested to:  Patient Instructions  Please stop: - Novolin 70/30   Start: - Lantus 15 units daily at night - NovoLog 4-6 units 3x a day 10-15 min before meals (may not need this before b'fast and lunch)  Continue: - Ozempic 0.5 mg weekly  Please return in 3-4 months with your sugar log.   - we checked her HbA1c: 6.9% (better) - advised to check sugars at different times of the day - 2x a day, rotating check times - advised for yearly eye exams >> she is UTD - return to clinic in 3-4 months  2.  Obesity class I -At previous visits, I suggested to increase intake of fresh fruit, dried fruit, and other snacks.  I also recommended to try to switch from smoothies in the morning to oatmeal. -At last visits we discussed about stopping protein shakes between meals -Will continue Ozempic which should also help with weight loss -She lost 11 pounds before the last 2 visits combined -lost 4 more lbs since then  3. HL -Reviewed latest lipid panel from 12/2022: Extremely high LDL, triglycerides also slightly high: Lab Results  Component Value Date   CHOL 315 (H) 01/18/2023   HDL 54.10 01/18/2023   LDLCALC 229 (H) 01/18/2023   LDLDIRECT 144.6 02/03/2010   TRIG 161.0 (H) 01/18/2023   CHOLHDL 6 01/18/2023  -She is not on a statin due to previous muscle weakness -Options for treatment are ezetimibe and evolocumab/alirocumab.  However, she is on palliative care, with only mandatory medications.  Further management per PCP.  Carlus Pavlov, MD PhD [facility] Endocrinology

## 2023-06-23 NOTE — Patient Instructions (Addendum)
Please stop: - Novolin 70/30   Start: - Lantus 15 units daily at night - NovoLog 4-6 units 3x a day 10-15 min before meals (may not need this before b'fast and lunch)  Continue: - Ozempic 0.5 mg weekly  Please return in 4 months with your sugar log.

## 2023-06-28 ENCOUNTER — Telehealth: Payer: Self-pay | Admitting: Pulmonary Disease

## 2023-06-28 ENCOUNTER — Encounter: Payer: Self-pay | Admitting: Internal Medicine

## 2023-06-28 DIAGNOSIS — J432 Centrilobular emphysema: Secondary | ICD-10-CM

## 2023-06-28 DIAGNOSIS — J9611 Chronic respiratory failure with hypoxia: Secondary | ICD-10-CM

## 2023-06-28 NOTE — Telephone Encounter (Signed)
ONO with RA 06/22/23 >> test time 7 hrs 30 min.  Baseline SpO2 95%, low SpO2 81%.  Spent 41 min 7 sec with SpO2 < 88%.   Please let her know her oxygen is low at night.  Please let her know I have sent an order to get her set up with 2 liters oxygen with exertion and sleep through [facility].

## 2023-06-29 ENCOUNTER — Encounter (HOSPITAL_BASED_OUTPATIENT_CLINIC_OR_DEPARTMENT_OTHER): Payer: Self-pay | Admitting: Pulmonary Disease

## 2023-06-29 NOTE — Telephone Encounter (Signed)
Left message on VM for patient to call clinic regarding ONO results and order for oxygen through [facility] per Dr. Craige Cotta.

## 2023-06-30 ENCOUNTER — Encounter: Payer: Self-pay | Admitting: Internal Medicine

## 2023-07-01 ENCOUNTER — Encounter: Payer: Self-pay | Admitting: Internal Medicine

## 2023-07-01 MED ORDER — NOVOFINE PLUS PEN NEEDLE 32G X 4 MM MISC: 2 refills | Status: DC

## 2023-07-01 NOTE — Telephone Encounter (Signed)
Went over [employer] with patient. Advised order for oxygen has been sent to [facility]. She verbalized understanding. NFN

## 2023-07-02 ENCOUNTER — Encounter: Payer: Self-pay | Admitting: Internal Medicine

## 2023-07-06 ENCOUNTER — Encounter: Payer: Self-pay | Admitting: Internal Medicine

## 2023-07-07 ENCOUNTER — Encounter: Payer: Self-pay | Admitting: Internal Medicine

## 2023-07-08 ENCOUNTER — Encounter (HOSPITAL_BASED_OUTPATIENT_CLINIC_OR_DEPARTMENT_OTHER): Payer: Self-pay | Admitting: Pulmonary Disease

## 2023-07-08 DIAGNOSIS — J432 Centrilobular emphysema: Secondary | ICD-10-CM

## 2023-07-11 ENCOUNTER — Telehealth: Payer: Self-pay

## 2023-07-11 DIAGNOSIS — N1831 Type 2 diabetes mellitus with diabetic chronic kidney disease: Secondary | ICD-10-CM

## 2023-07-11 MED ORDER — FREESTYLE LIBRE 3 READER DEVI: 0 refills | Status: DC

## 2023-07-11 MED ORDER — FREESTYLE LIBRE 3 SENSOR MISC
5 refills | Status: DC
Start: 2023-07-11 — End: 2023-11-28

## 2023-07-11 NOTE — Telephone Encounter (Signed)
LMTRC  JMiller,RMA 

## 2023-07-11 NOTE — Telephone Encounter (Signed)
J, I am assuming that these are the sugars at night.  The decision about Lantus should be made by comparing the sugars at bedtime to the sugars in the morning.  Can she give [place] the fasting sugars, also?

## 2023-07-11 NOTE — Telephone Encounter (Signed)
Pt has been notified and voices understanding.  

## 2023-07-11 NOTE — Telephone Encounter (Signed)
Patient called back her readings are: 123,131,143,128,112,113 (Fasting )

## 2023-07-11 NOTE — Telephone Encounter (Signed)
Patient Question: What is the number that would make her know she should take it at bed time and how much? Considering the numbers below.  Bedtime highest: 200  Last 6 readings: 107,124,172,200,152,154

## 2023-08-01 ENCOUNTER — Encounter: Payer: Self-pay | Admitting: Internal Medicine

## 2023-08-02 ENCOUNTER — Other Ambulatory Visit: Payer: Self-pay

## 2023-08-02 MED ORDER — NOVOFINE PLUS PEN NEEDLE 32G X 4 MM MISC: 2 refills | Status: DC

## 2023-08-10 ENCOUNTER — Other Ambulatory Visit: Payer: Self-pay

## 2023-08-10 ENCOUNTER — Encounter: Payer: Self-pay | Admitting: Internal Medicine

## 2023-08-10 MED ORDER — NOVOFINE PLUS PEN NEEDLE 32G X 4 MM MISC: 2 refills | Status: DC

## 2023-08-11 ENCOUNTER — Encounter (INDEPENDENT_AMBULATORY_CARE_PROVIDER_SITE_OTHER): Payer: Self-pay

## 2023-08-16 ENCOUNTER — Telehealth: Payer: Self-pay

## 2023-08-16 MED ORDER — PEN NEEDLES 32G X 4 MM MISC: 1.0000 | 6 refills | Status: DC

## 2023-08-16 NOTE — Telephone Encounter (Signed)
Novofine not available, changed to generic.

## 2023-08-17 ENCOUNTER — Other Ambulatory Visit (HOSPITAL_COMMUNITY): Payer: Self-pay | Admitting: Internal Medicine

## 2023-08-17 ENCOUNTER — Other Ambulatory Visit: Payer: Self-pay | Admitting: Internal Medicine

## 2023-08-28 ENCOUNTER — Other Ambulatory Visit (HOSPITAL_COMMUNITY): Payer: Self-pay | Admitting: Internal Medicine

## 2023-08-29 NOTE — Progress Notes (Unsigned)
Subjective:    Patient ID: [name], female    DOB: 30-Aug-1931, 89 y.o.   MRN: [id number]      HPI Veralynn is here for No chief complaint on file.    Abdominal cramping -     Medications and allergies reviewed with patient and updated if appropriate.  Current Outpatient Medications on File Prior to Visit  Medication Sig Dispense Refill   acetaminophen (TYLENOL) 325 MG tablet Take 2 tablets (650 mg total) by mouth every 6 (six) hours as needed for mild pain or headache. 20 tablet 0   amLODipine (NORVASC) 5 MG tablet TAKE 1 TABLET BY MOUTH DAILY 90 tablet 3   apixaban (ELIQUIS) 5 MG TABS tablet TAKE 1 TABLET BY MOUTH TWICE  DAILY 180 tablet 0   Azelastine HCl (ASTEPRO) 0.15 % SOLN Place 1 spray into the nose daily as needed.     Blood Glucose Monitoring Suppl (ONETOUCH VERIO) w/Device KIT Use as instructed to check blood sugar 1 kit 0   carvedilol (COREG) 6.25 MG tablet TAKE 1 TABLET BY MOUTH TWICE  DAILY WITH MEALS 180 tablet 3   Continuous Glucose Receiver (FREESTYLE LIBRE 3 READER) DEVI Use to check glucose continuously. 1 each 0   Continuous Glucose Sensor (FREESTYLE LIBRE 3 SENSOR) MISC Place 1 sensor on the skin every 14 days. Use to check glucose continuously 2 each 5   fluticasone (FLONASE) 50 MCG/ACT nasal spray Place 1 spray into both nostrils daily as needed for allergies or rhinitis. 16 g 2   insulin aspart (NOVOLOG FLEXPEN) 100 UNIT/ML FlexPen Inject 4-6 Units into the skin 3 (three) times daily before meals. 15 mL 3   insulin aspart protamine- aspart (NOVOLOG MIX 70/30) (70-30) 100 UNIT/ML injection Inject 14 Units into the skin daily with breakfast. 12 units in the PM     insulin glargine (LANTUS SOLOSTAR) 100 UNIT/ML Solostar Pen Inject 15 Units into the skin daily. 15 mL 3   Insulin Pen Needle (BD PEN NEEDLE NANO 2ND GEN) 32G X 4 MM MISC Use 3x a day as instructed 300 each 3   Lancet Devices (LANCING DEVICE) MISC Use as advised - for Delica Lancets 1  each 0   Lancets (ONETOUCH DELICA PLUS LANCET33G) MISC USE TO MONITOR GLUCOSE  LEVELS TWICE DAILY 200 each 3   Multiple Vitamins-Minerals (CENTRUM SILVER ULTRA WOMENS PO) Take 1 tablet by mouth daily.     nitroGLYCERIN (NITROSTAT) 0.4 MG SL tablet Place 1 tablet (0.4 mg total) under the tongue every 5 (five) minutes as needed for chest pain. Reported on 01/28/2016 30 tablet 3   ONETOUCH VERIO test strip CHECK BLOOD SUGAR TWICE DAILY 200 strip 3   OXYGEN Inhale 3 Doses into the lungs as needed. 3 liters     potassium chloride SA (KLOR-CON M) 20 MEQ tablet TAKE 2 TABLETS BY MOUTH DAILY 180 tablet 3   Semaglutide,0.25 or 0.5MG /DOS, (OZEMPIC, 0.25 OR 0.5 MG/DOSE,) 2 MG/1.5ML SOPN Inject 0.5 mg into the skin once a week. 4.5 mL 3   torsemide (DEMADEX) 20 MG tablet TAKE 2 TABLETS BY MOUTH TWICE  DAILY 360 tablet 3   Vitamin D, Cholecalciferol, 25 MCG (1000 UT) CAPS Take 1 capsule by mouth daily.     No current facility-administered medications on file prior to visit.    Review of Systems     Objective:  There were no vitals filed for this visit. BP Readings from Last 3 Encounters:  06/23/23 (!) 140/80  06/02/23  126/68  05/05/23 (!) 128/56   Wt Readings from Last 3 Encounters:  06/23/23 151 lb 12.8 oz (68.9 kg)  06/02/23 153 lb (69.4 kg)  02/17/23 153 lb 9.6 oz (69.7 kg)   There is no height or weight on file to calculate BMI.    Physical Exam         Assessment & Plan:    See Problem List for Assessment and Plan of chronic medical problems.

## 2023-08-30 ENCOUNTER — Ambulatory Visit: Payer: Medicare Other | Admitting: Internal Medicine

## 2023-08-30 ENCOUNTER — Encounter: Payer: Self-pay | Admitting: Internal Medicine

## 2023-08-30 VITALS — BP 138/70 | HR 82 | Temp 98.0°F | Ht 63.0 in | Wt 148.0 lb

## 2023-08-30 DIAGNOSIS — R1030 Lower abdominal pain, unspecified: Secondary | ICD-10-CM

## 2023-08-30 DIAGNOSIS — I10 Essential (primary) hypertension: Secondary | ICD-10-CM

## 2023-08-30 LAB — COMPREHENSIVE METABOLIC PANEL
ALT: 15 U/L (ref 0–35)
AST: 25 U/L (ref 0–37)
Albumin: 3.9 g/dL (ref 3.5–5.2)
Alkaline Phosphatase: 93 U/L (ref 39–117)
BUN: 26 mg/dL — ABNORMAL HIGH (ref 6–23)
CO2: 34 meq/L — ABNORMAL HIGH (ref 19–32)
Calcium: 10.6 mg/dL — ABNORMAL HIGH (ref 8.4–10.5)
Chloride: 95 meq/L — ABNORMAL LOW (ref 96–112)
Creatinine, Ser: 1.13 mg/dL (ref 0.40–1.20)
GFR: 43.05 mL/min — ABNORMAL LOW (ref >60.00)
Glucose, Bld: 153 mg/dL — ABNORMAL HIGH (ref 70–99)
Potassium: 4 meq/L (ref 3.5–5.1)
Sodium: 140 meq/L (ref 135–145)
Total Bilirubin: 0.7 mg/dL (ref 0.2–1.2)
Total Protein: 8.1 g/dL (ref 6.0–8.3)

## 2023-08-30 LAB — CBC WITH DIFFERENTIAL/PLATELET
Basophils Absolute: 0 10*3/uL (ref 0.0–0.1)
Basophils Relative: 0.6 % (ref 0.0–3.0)
Eosinophils Absolute: 0.2 10*3/uL (ref 0.0–0.7)
Eosinophils Relative: 5.4 % — ABNORMAL HIGH (ref 0.0–5.0)
HCT: 46.1 % — ABNORMAL HIGH (ref 36.0–46.0)
Hemoglobin: 14.5 g/dL (ref 12.0–15.0)
Lymphocytes Relative: 17.6 % (ref 12.0–46.0)
Lymphs Abs: 0.8 10*3/uL (ref 0.7–4.0)
MCHC: 31.5 g/dL (ref 30.0–36.0)
MCV: 94 fl (ref 78.0–100.0)
Monocytes Absolute: 0.7 10*3/uL (ref 0.1–1.0)
Monocytes Relative: 14.9 % — ABNORMAL HIGH (ref 3.0–12.0)
Neutro Abs: 2.7 10*3/uL (ref 1.4–7.7)
Neutrophils Relative %: 61.5 % (ref 43.0–77.0)
Platelets: 211 10*3/uL (ref 150.0–400.0)
RBC: 4.91 Mil/uL (ref 3.87–5.11)
RDW: 13.8 % (ref 11.5–15.5)
WBC: 4.4 10*3/uL (ref 4.0–10.5)

## 2023-08-30 LAB — URINALYSIS, ROUTINE W REFLEX MICROSCOPIC
Bilirubin Urine: NEGATIVE
Hgb urine dipstick: NEGATIVE
Ketones, ur: NEGATIVE
Nitrite: NEGATIVE
Specific Gravity, Urine: 1.01 (ref 1.000–1.030)
Urine Glucose: NEGATIVE
Urobilinogen, UA: 0.2 (ref 0.0–1.0)
pH: 6 (ref 5.0–8.0)

## 2023-08-30 NOTE — Patient Instructions (Addendum)
      Blood work was ordered.   The lab is on the first floor.    Medications changes include :   none    A Ct scan of your abdomen was ordered and someone will call you to schedule an appointment.     Return if symptoms worsen or fail to improve.

## 2023-08-30 NOTE — Assessment & Plan Note (Signed)
Chronic Blood pressure well controlled Continue amlodipine 5 mg daily, Coreg 6.25 mg twice daily

## 2023-08-30 NOTE — Assessment & Plan Note (Signed)
Chronic Intermittent abdominal cramping across lower abdomen for years Cramping is transient and does not seem to be related to bowel movements, eating or urination. No changes in bowel movements No other concerning symptoms Mild tenderness across the lower abdomen on exam Given duration and tenderness on exam will get CT abdomen pelvis CBC, CMP, UA Further evaluation depending on above results

## 2023-08-31 ENCOUNTER — Encounter: Payer: Self-pay | Admitting: Internal Medicine

## 2023-09-06 ENCOUNTER — Ambulatory Visit (HOSPITAL_BASED_OUTPATIENT_CLINIC_OR_DEPARTMENT_OTHER)
Admission: RE | Admit: 2023-09-06 | Discharge: 2023-09-06 | Disposition: A | Discharge status: Home / Self Care | Payer: Medicare Other | Source: Ambulatory Visit | Attending: Internal Medicine | Admitting: Internal Medicine

## 2023-09-06 DIAGNOSIS — R1030 Lower abdominal pain, unspecified: Secondary | ICD-10-CM | POA: Insufficient documentation

## 2023-09-06 MED ORDER — IOHEXOL 300 MG/ML  SOLN
100.0000 mL | Freq: Once | INTRAMUSCULAR | Status: AC | PRN
  Administered 2023-09-06: 85 mL via INTRAVENOUS

## 2023-09-08 ENCOUNTER — Ambulatory Visit (INDEPENDENT_AMBULATORY_CARE_PROVIDER_SITE_OTHER): Payer: Medicare Other

## 2023-09-08 VITALS — Ht 63.0 in | Wt 148.0 lb

## 2023-09-08 DIAGNOSIS — Z0000 Encounter for general adult medical examination without abnormal findings: Secondary | ICD-10-CM | POA: Diagnosis not present

## 2023-09-08 NOTE — Progress Notes (Signed)
Subjective:   [name] is a 89 y.o. female who presents for Medicare Annual (Subsequent) preventive examination.  Visit Complete: Virtual  I connected with  [name] on 09/08/23 by a audio enabled telemedicine application and verified that I am speaking with the correct person using two identifiers.  Patient Location: Home  Provider Location: Office/Clinic  I discussed the limitations of evaluation and management by telemedicine. The patient expressed understanding and agreed to proceed.  Vital Signs: Because this visit was a virtual/telehealth visit, some criteria may be missing or patient reported. Any vitals not documented were not able to be obtained and vitals that have been documented are patient reported.   Patient provided his/her weight during this virtual phone visit.  Review of Systems     Cardiac Risk Factors include: advanced age (>32men, >85 women);diabetes mellitus;dyslipidemia;family history of premature cardiovascular disease;hypertension     Objective:    Today's Vitals   09/08/23 1512  Weight: 148 lb (67.1 kg)  Height: 5\' 3"  (1.6 m)  PainSc: 0-No pain   Body mass index is 26.22 kg/m.     09/08/2023    3:14 PM 09/06/2022    9:25 AM 09/03/2021    9:53 AM 03/10/2021   10:15 AM 01/31/2021    7:46 AM 05/15/2020    8:04 PM 05/15/2020   12:18 PM  Advanced Directives  Does Patient Have a Medical Advance Directive? No Yes Yes Yes No Yes No  Type of Advance Directive  Living will Healthcare Power of [place];Living will Healthcare Power of [place];Living will;Out of facility DNR (pink MOST or yellow form)  Healthcare Power of [place];Living will   Does patient want to make changes to medical advance directive? No - Patient declined  No - Patient declined No - Patient declined  No - Patient declined   Copy of Healthcare Power of Attorney in Chart?   Yes - validated most recent copy scanned in chart (See row information)      Would patient like  information on creating a medical advance directive? No - Patient declined    No - Patient declined  No - Patient declined    Current Medications (verified) Outpatient Encounter Medications as of 09/08/2023  Medication Sig   acetaminophen (TYLENOL) 325 MG tablet Take 2 tablets (650 mg total) by mouth every 6 (six) hours as needed for mild pain or headache.   amLODipine (NORVASC) 5 MG tablet TAKE 1 TABLET BY MOUTH DAILY   Azelastine HCl (ASTEPRO) 0.15 % SOLN Place 1 spray into the nose daily as needed.   Blood Glucose Monitoring Suppl (ONETOUCH VERIO) w/Device KIT Use as instructed to check blood sugar   carvedilol (COREG) 6.25 MG tablet TAKE 1 TABLET BY MOUTH TWICE  DAILY WITH MEALS   Continuous Glucose Receiver (FREESTYLE LIBRE 3 READER) DEVI Use to check glucose continuously.   Continuous Glucose Sensor (FREESTYLE LIBRE 3 SENSOR) MISC Place 1 sensor on the skin every 14 days. Use to check glucose continuously   ELIQUIS 5 MG TABS tablet TAKE 1 TABLET BY MOUTH TWICE  DAILY   fluticasone (FLONASE) 50 MCG/ACT nasal spray Place 1 spray into both nostrils daily as needed for allergies or rhinitis.   insulin aspart (NOVOLOG FLEXPEN) 100 UNIT/ML FlexPen Inject 4-6 Units into the skin 3 (three) times daily before meals.   insulin aspart protamine- aspart (NOVOLOG MIX 70/30) (70-30) 100 UNIT/ML injection Inject 14 Units into the skin daily with breakfast. 12 units in the PM   insulin  glargine (LANTUS SOLOSTAR) 100 UNIT/ML Solostar Pen Inject 15 Units into the skin daily.   Insulin Pen Needle (BD PEN NEEDLE NANO 2ND GEN) 32G X 4 MM MISC Use 3x a day as instructed   Lancet Devices (LANCING DEVICE) MISC Use as advised - for Delica Lancets   Lancets (ONETOUCH DELICA PLUS LANCET33G) MISC USE TO MONITOR GLUCOSE  LEVELS TWICE DAILY   Multiple Vitamins-Minerals (CENTRUM SILVER ULTRA WOMENS PO) Take 1 tablet by mouth daily.   nitroGLYCERIN (NITROSTAT) 0.4 MG SL tablet Place 1 tablet (0.4 mg total) under the  tongue every 5 (five) minutes as needed for chest pain. Reported on 01/28/2016   ONETOUCH VERIO test strip CHECK BLOOD SUGAR TWICE DAILY   OXYGEN Inhale 3 Doses into the lungs as needed. 3 liters   potassium chloride SA (KLOR-CON M) 20 MEQ tablet TAKE 2 TABLETS BY MOUTH DAILY   Semaglutide,0.25 or 0.5MG /DOS, (OZEMPIC, 0.25 OR 0.5 MG/DOSE,) 2 MG/1.5ML SOPN Inject 0.5 mg into the skin once a week.   torsemide (DEMADEX) 20 MG tablet TAKE 2 TABLETS BY MOUTH TWICE  DAILY   Vitamin D, Cholecalciferol, 25 MCG (1000 UT) CAPS Take 1 capsule by mouth daily.   No facility-administered encounter medications on file as of 09/08/2023.    Allergies (verified) Levaquin [levofloxacin], Lobster [shellfish allergy], Penicillins, Valsartan, Codeine, Lipitor [atorvastatin], Oxycodone, Statins, Tramadol, Amlodipine besylate, Crestor [rosuvastatin calcium], and Tramadol hcl   History: Past Medical History:  Diagnosis Date   Anemia    iron defficiency   Anginal pain (HCC)    Arthritis    "knees, feet, hands; joints" (05/27/2015)   Asthma    Atrial fibrillation or flutter    s/p RFCA 7/08;   s/p DCCV in past;   previously on amiodarone;  amio stopped due to lung toxicity   CAD (coronary artery disease)    s/p NSTEMI tx with BMS to OM1 3/08;  cath 3/08: pOM 99% tx with PCI, pLAD 20%, ? mod stenosis at the AM   CAP (community acquired pneumonia) 05/24/2015   CHF (congestive heart failure) (HCC)    Chronic diastolic heart failure (HCC)    echo 11/11:  EF 55-60%, severe LVH, mod LAE, mild MR, mildly increased PASP   COPD (chronic obstructive pulmonary disease) (HCC)    Degenerative joint disease    Dystrophy, corneal stromal    Heart murmur    HLD (hyperlipidemia)    HTN (hypertension)    essential nos   Hypopotassemia    PMH of   Muscle pain    Myocardial infarction (HCC) 2008   OSA on CPAP    Osteoporosis    Pneumonia 05/27/2015   Protein calorie malnutrition (HCC)    Rash and nonspecific skin eruption     both arms,awaiting bio   dr Emily Filbert   Seasonal allergies    Shortness of breath dyspnea    Type II diabetes mellitus (HCC)    Dr Everardo All   Past Surgical History:  Procedure Laterality Date   A FLUTTER ABLATION     Dr Ladona Ridgel   ABDOMINAL AORTOGRAM W/LOWER EXTREMITY Bilateral 04/11/2020   Procedure: ABDOMINAL AORTOGRAM W/LOWER EXTREMITY;  Surgeon: Chuck Hint, MD;  Location[facility] INVASIVE CV LAB;  Service: Cardiovascular;  Laterality: Bilateral;   CATARACT EXTRACTION W/ INTRAOCULAR LENS  IMPLANT, BILATERAL Bilateral    CHOLECYSTECTOMY     COLONOSCOPY  6/07   2 polyps Dr. Elvina Mattes in [place]   colonoscopy with polypectomy      X 2; [facility]  CORNEAL TRANSPLANT Bilateral    CORONARY ANGIOPLASTY WITH STENT PLACEMENT  02/2007   BMS w/Dr Ladona Ridgel   CORONARY ATHERECTOMY N/A 05/16/2020   Procedure: CORONARY ATHERECTOMY;  Surgeon: Swaziland, Peter M, MD;  Location: [facility] INVASIVE CV LAB;  Service: Cardiovascular;  Laterality: N/A;   CORONARY BALLOON ANGIOPLASTY N/A 05/16/2020   Procedure: CORONARY BALLOON ANGIOPLASTY;  Surgeon: Swaziland, Peter M, MD;  Location: [facility] INVASIVE CV LAB;  Service: Cardiovascular;  Laterality: N/A;   CORONARY STENT INTERVENTION N/A 05/16/2020   Procedure: CORONARY STENT INTERVENTION;  Surgeon: Swaziland, Peter M, MD;  Location: [facility] INVASIVE CV LAB;  Service: Cardiovascular;  Laterality: N/A;   CORONARY ULTRASOUND/IVUS N/A 05/16/2020   Procedure: Intravascular Ultrasound/IVUS;  Surgeon: Swaziland, Peter M, MD;  Location: [facility] INVASIVE CV LAB;  Service: Cardiovascular;  Laterality: N/A;   EYE SURGERY     gravid 2 para 2     KNEE CARTILAGE SURGERY Right    LEFT AND RIGHT HEART CATHETERIZATION WITH CORONARY ANGIOGRAM N/A 05/07/2014   Procedure: LEFT AND RIGHT HEART CATHETERIZATION WITH CORONARY ANGIOGRAM;  Surgeon: Peter M Swaziland, MD;  Location: [facility] CATH LAB;  Service: Cardiovascular;  Laterality: N/A;   PERIPHERAL VASCULAR INTERVENTION  04/11/2020   Procedure: PERIPHERAL VASCULAR  INTERVENTION;  Surgeon: Chuck Hint, MD;  Location: [facility] INVASIVE CV LAB;  Service: Cardiovascular;;   RIGHT HEART CATH N/A 02/04/2021   Procedure: RIGHT HEART CATH;  Surgeon: Dolores Patty, MD;  Location: [facility] INVASIVE CV LAB;  Service: Cardiovascular;  Laterality: N/A;   RIGHT/LEFT HEART CATH AND CORONARY ANGIOGRAPHY N/A 05/16/2020   Procedure: RIGHT/LEFT HEART CATH AND CORONARY ANGIOGRAPHY;  Surgeon: Dolores Patty, MD;  Location: [facility] INVASIVE CV LAB;  Service: Cardiovascular;  Laterality: N/A;   TOTAL KNEE ARTHROPLASTY Right 06/28/2016   Procedure: TOTAL KNEE ARTHROPLASTY;  Surgeon: Gean Birchwood, MD;  Location: [facility] OR;  Service: Orthopedics;  Laterality: Right;   Family History  Problem Relation Age of Onset   Diabetes Mother    Hypertension Mother    Transient ischemic attack Mother    Arthritis Mother    Heart attack Father 1   Arthritis Father    Hypertension Father    Breast cancer Maternal Aunt    Arthritis Maternal Aunt    Social History   Socioeconomic History   Marital status: Widowed    Spouse name: Not on file   Number of children: 2   Years of education: 16   Highest education level: Bachelor's degree (e.g., BA, AB, BS)  Occupational History   Occupation: Teacher/ Retired  Tobacco Use   Smoking status: Former    Current packs/day: 0.00    Average packs/day: 0.3 packs/day for 18.0 years (4.5 ttl pk-yrs)    Types: Cigarettes    Start date: 12/27/1950    Quit date: 12/27/1968    Years since quitting: 54.7   Smokeless tobacco: Never   Tobacco comments:    smoked 1952- 1970, up to 1 pp week  Vaping Use   Vaping status: Never Used  Substance and Sexual Activity   Alcohol use: Yes    Comment: occ   Drug use: No   Sexual activity: Never  Other Topics Concern   Not on file  Social History Narrative   Teacher. Widowed. Rarely drinks cafeine.    Social Determinants of Health   Financial Resource Strain: Low Risk  (09/08/2023)   Overall Financial  Resource Strain (CARDIA)    Difficulty of Paying Living Expenses: Not hard at all  Food  Insecurity: No Food Insecurity (09/08/2023)   Hunger Vital Sign    Worried About Running Out of Food in the Last Year: Never true    Ran Out of Food in the Last Year: Never true  Transportation Needs: No Transportation Needs (09/08/2023)   PRAPARE - Administrator, Civil Service (Medical): No    Lack of Transportation (Non-Medical): No  Physical Activity: Sufficiently Active (09/08/2023)   Exercise Vital Sign    Days of Exercise per Week: 5 days    Minutes of Exercise per Session: 30 min  Stress: No Stress Concern Present (09/08/2023)   [employer] of Occupational Health - Occupational Stress Questionnaire    Feeling of Stress : Not at all  Social Connections: Socially Integrated (09/08/2023)   Social Connection and Isolation Panel [NHANES]    Frequency of Communication with Friends and Family: More than three times a week    Frequency of Social Gatherings with Friends and Family: More than three times a week    Attends Religious Services: More than 4 times per year    Active Member of [employer] or Organizations: Yes    Attends Engineer, structural: More than 4 times per year    Marital Status: Married    Tobacco Counseling Counseling given: Not Answered Tobacco comments: smoked 1952- 1970, up to 1 pp week   Clinical Intake:  Pre-visit preparation completed: Yes  Pain : No/denies pain Pain Score: 0-No pain     BMI - recorded: 26.22 Nutritional Status: BMI 25 -29 Overweight Nutritional Risks: None Diabetes: Yes CBG done?: No Did pt. bring in CBG monitor from home?: No  How often do you need to have someone help you when you read instructions, pamphlets, or other written materials from your doctor or pharmacy?: 1 - Never What is the last grade level you completed in school?: College Graduate  Interpreter Needed?: No  Information entered by :: Avraham Benish N. Valente Fosberg,  LPN.   Activities of Daily Living    09/08/2023    3:15 PM  In your present state of health, do you have any difficulty performing the following activities:  Hearing? 0  Vision? 0  Difficulty concentrating or making decisions? 0  Walking or climbing stairs? 1  Dressing or bathing? 0  Doing errands, shopping? 0  Preparing Food and eating ? N  Using the Toilet? N  In the past six months, have you accidently leaked urine? Y  Do you have problems with loss of bowel control? Y  Managing your Medications? N  Managing your Finances? N  Housekeeping or managing your Housekeeping? N    Patient Care Team: Pincus Sanes, MD as PCP - General (Internal Medicine) [name] Lisette Abu, MD as PCP - Cardiology (Cardiology) Bensimhon, Bevelyn Buckles, MD as PCP - Advanced Heart Failure (Cardiology) Coralyn Helling, MD as Consulting Physician (Pulmonary Disease) Romero Belling, MD (Inactive) as Consulting Physician (Endocrinology) Bensimhon, Bevelyn Buckles, MD as Consulting Physician (Cardiology) Kathyrn Sheriff, [facility] (Inactive) as Pharmacist (Pharmacist) Luane School, OD (Optometry)  Indicate any recent Medical Services you may have received from other than [facility] providers in the past year (date may be approximate).     Assessment:   This is a routine wellness examination for [place].  Hearing/Vision screen Hearing Screening - Comments:: Patient denied any hearing difficulty.   No hearing aids.  Vision Screening - Comments:: Patient does wear corrective lenses/contacts.  Annual eye exam done by: Virginia Crews, OD.    Goals Addressed  This Visit's Progress    Maintain my health, stay independent, physically and socially active.        Depression Screen    09/08/2023    3:14 PM 08/30/2023    9:45 AM 01/18/2023   10:40 AM 09/06/2022    9:28 AM 03/29/2022    9:00 AM 11/27/2021    9:00 AM 09/03/2021    9:51 AM  PHQ 2/9 Scores  PHQ - 2 Score 0 0 0 0 1 0 0  PHQ- 9 Score 0  0        Fall  Risk    09/08/2023    3:14 PM 08/30/2023   11:02 AM 08/30/2023    9:44 AM 01/18/2023   10:39 AM 09/06/2022    9:33 AM  Fall Risk   Falls in the past year? 0 0 0 1 0  Number falls in past yr: 0 0 0 1 0  Injury with Fall? 0 0 0 0 0  Risk for fall due to : No Fall Risks No Fall Risks No Fall Risks History of fall(s) No Fall Risks;History of fall(s);Impaired balance/gait  Follow up Falls prevention discussed Falls evaluation completed Falls evaluation completed Falls evaluation completed Falls prevention discussed;Falls evaluation completed    MEDICARE RISK AT HOME: Medicare Risk at Home Any stairs in or around the home?: No If so, are there any without handrails?: No Home free of loose throw rugs in walkways, pet beds, electrical cords, etc?: Yes Adequate lighting in your home to reduce risk of falls?: Yes Life alert?: Yes Use of a cane, walker or w/c?: Yes Grab bars in the bathroom?: Yes Shower chair or bench in shower?: Yes Elevated toilet seat or a handicapped toilet?: Yes  TIMED UP AND GO:  Was the test performed?  No    Cognitive Function:    10/12/2019   12:44 PM 07/31/2018    9:39 AM  MMSE - Mini Mental State Exam  Not completed: Refused   Orientation to time  5  Orientation to Place  5  Registration  3  Attention/ Calculation  5  Recall  2  Language- name 2 objects  2  Language- repeat  1  Language- follow 3 step command  3  Language- read & follow direction  1  Write a sentence  1  Copy design  1  Total score  29        09/08/2023    3:15 PM 09/06/2022    9:38 AM  6CIT Screen  What Year? 0 points 0 points  What month? 0 points 0 points  What time? 0 points 0 points  Count back from 20 0 points 0 points  Months in reverse 0 points 0 points  Repeat phrase 0 points 0 points  Total Score 0 points 0 points    Immunizations Immunization History  Administered Date(s) Administered   Fluad Quad(high Dose 65+) 08/22/2019, 10/07/2021, 10/01/2022   Influenza  Split 11/16/2011, 09/26/2012   Influenza Whole 12/27/2004, 10/21/2008, 10/20/2009, 11/11/2010   Influenza, High Dose Seasonal PF 10/14/2016, 10/10/2017, 09/28/2018   Influenza,inj,Quad PF,6+ Mos 09/24/2013, 12/16/2014, 09/19/2015   Moderna Covid-19 Vaccine Bivalent Booster 28yrs & up 10/01/2022   Moderna SARS-COV2 Booster Vaccination 11/03/2020   Moderna Sars-Covid-2 Vaccination 02/08/2020, 03/10/2020   PPD Test 05/31/2015, 07/07/2016   Pneumococcal Conjugate-13 07/31/2018   Pneumococcal Polysaccharide-23 12/16/2014   Pneumococcal-Unspecified 05/31/2015   Td 06/15/2010    TDAP status: Due, Education has been provided regarding the importance of this vaccine. Advised  may receive this vaccine at local pharmacy or Health Dept. Aware to provide a copy of the vaccination record if obtained from local pharmacy or Health Dept. Verbalized acceptance and understanding.  Flu Vaccine status: Due, Education has been provided regarding the importance of this vaccine. Advised may receive this vaccine at local pharmacy or Health Dept. Aware to provide a copy of the vaccination record if obtained from local pharmacy or Health Dept. Verbalized acceptance and understanding.  Pneumococcal vaccine status: Up to date  Covid-19 vaccine status: Completed vaccines  Qualifies for Shingles Vaccine? Yes   Zostavax completed No   Shingrix Completed?: No.    Education has been provided regarding the importance of this vaccine. Patient has been advised to call insurance company to determine out of pocket expense if they have not yet received this vaccine. Advised may also receive vaccine at local pharmacy or Health Dept. Verbalized acceptance and understanding.  Screening Tests Health Maintenance  Topic Date Due   Zoster Vaccines- Shingrix (1 of 2) Never done   DTaP/Tdap/Td (2 - Tdap) 06/15/2020   OPHTHALMOLOGY EXAM  11/16/2022   HEMOGLOBIN A1C  08/04/2023   COVID-19 Vaccine (4 - 2023-24 season) 08/28/2023    INFLUENZA VACCINE  03/26/2024 (Originally 07/28/2023)   FOOT EXAM  02/16/2024   Pneumonia Vaccine 67+ Years old  Completed   HPV VACCINES  Aged Out   DEXA SCAN  Discontinued    Health Maintenance  Health Maintenance Due  Topic Date Due   Zoster Vaccines- Shingrix (1 of 2) Never done   DTaP/Tdap/Td (2 - Tdap) 06/15/2020   OPHTHALMOLOGY EXAM  11/16/2022   HEMOGLOBIN A1C  08/04/2023   COVID-19 Vaccine (4 - 2023-24 season) 08/28/2023    Colorectal cancer screening: No longer required.   Mammogram status: No longer required due to age.  Bone Density status: Completed 02/16/2017. Results reflect: Bone density results: OSTEOPENIA. Repeat every 2-3 years.  Lung Cancer Screening: (Low Dose CT Chest recommended if Age 45-80 years, 20 pack-year currently smoking OR have quit w/in 15years.) does not qualify.   Lung Cancer Screening Referral: no  Additional Screening:  Hepatitis C Screening: does not qualify; Completed: no  Vision Screening: Recommended annual ophthalmology exams for early detection of glaucoma and other disorders of the eye. Is the patient up to date with their annual eye exam?  Yes  Who is the provider or what is the name of the office in which the patient attends annual eye exams? Luane School, OD. If pt is not established with a provider, would they like to be referred to a provider to establish care? No .   Dental Screening: Recommended annual dental exams for proper oral hygiene  Diabetic Foot Exam: Diabetic Foot Exam: Completed 02/15/2023  Community Resource Referral / Chronic Care Management: CRR required this visit?  No   CCM required this visit?  No     Plan:     I have personally reviewed and noted the following in the patient's chart:   Medical and social history Use of alcohol, tobacco or illicit drugs  Current medications and supplements including opioid prescriptions. Patient is not currently taking opioid prescriptions. Functional ability and  status Nutritional status Physical activity Advanced directives List of other physicians Hospitalizations, surgeries, and ER visits in previous 12 months Vitals Screenings to include cognitive, depression, and falls Referrals and appointments  In addition, I have reviewed and discussed with patient certain preventive protocols, quality metrics, and best practice recommendations. A written personalized care plan for  preventive services as well as general preventive health recommendations were provided to patient.     Mickeal Needy, LPN   05/24/4131   After Visit Summary: (MyChart) Due to this being a telephonic visit, the after visit summary with patients personalized plan was offered to patient via MyChart   Nurse Notes: Normal cognitive status assessed by direct observation via telephone conversation by this Nurse Health Advisor. No abnormalities found.  Patient provided his/her weight during this virtual phone visit.

## 2023-09-08 NOTE — Patient Instructions (Signed)
[name] , Thank you for taking time to come for your Medicare Wellness Visit. I appreciate your ongoing commitment to your health goals. Please review the following plan we discussed and let me know if I can assist you in the future.   Referrals/Orders/Follow-Ups/Clinician Recommendations: No  This is a list of the screening recommended for you and due dates:  Health Maintenance  Topic Date Due   Zoster (Shingles) Vaccine (1 of 2) Never done   DTaP/Tdap/Td vaccine (2 - Tdap) 06/15/2020   Eye exam for diabetics  11/16/2022   Hemoglobin A1C  08/04/2023   COVID-19 Vaccine (4 - 2023-24 season) 08/28/2023   Flu Shot  03/26/2024*   Complete foot exam   02/16/2024   Pneumonia Vaccine  Completed   HPV Vaccine  Aged Out   DEXA scan (bone density measurement)  Discontinued  *Topic was postponed. The date shown is not the original due date.    Advanced directives: (Declined) Advance directive discussed with you today. Even though you declined this today, please call our office should you change your mind, and we can give you the proper paperwork for you to fill out.  Next Medicare Annual Wellness Visit scheduled for next year: Yes

## 2023-09-12 ENCOUNTER — Encounter: Payer: Self-pay | Admitting: Internal Medicine

## 2023-09-19 ENCOUNTER — Encounter: Payer: Self-pay | Admitting: Internal Medicine

## 2023-09-19 ENCOUNTER — Telehealth: Payer: Self-pay | Admitting: Internal Medicine

## 2023-09-19 MED ORDER — AZITHROMYCIN 500 MG PO TABS: 500.0000 mg | ORAL_TABLET | Freq: Once | ORAL | 0 refills | Status: DC

## 2023-09-19 NOTE — Telephone Encounter (Signed)
Patient called and said that she was returning [facility] call.  Please call back.

## 2023-10-08 ENCOUNTER — Inpatient Hospital Stay (HOSPITAL_BASED_OUTPATIENT_CLINIC_OR_DEPARTMENT_OTHER)
Admission: EM | Admit: 2023-10-08 | Discharge: 2023-10-11 | DRG: 191 | Disposition: A | Discharge status: Home Health | Payer: Medicare Other | Attending: Internal Medicine | Admitting: Internal Medicine

## 2023-10-08 ENCOUNTER — Encounter (HOSPITAL_BASED_OUTPATIENT_CLINIC_OR_DEPARTMENT_OTHER): Payer: Self-pay

## 2023-10-08 ENCOUNTER — Emergency Department (HOSPITAL_BASED_OUTPATIENT_CLINIC_OR_DEPARTMENT_OTHER): Payer: Medicare Other | Admitting: Radiology

## 2023-10-08 ENCOUNTER — Other Ambulatory Visit: Payer: Self-pay

## 2023-10-08 DIAGNOSIS — I10 Essential (primary) hypertension: Secondary | ICD-10-CM | POA: Diagnosis present

## 2023-10-08 DIAGNOSIS — I2510 Atherosclerotic heart disease of native coronary artery without angina pectoris: Secondary | ICD-10-CM | POA: Diagnosis present

## 2023-10-08 DIAGNOSIS — Z7901 Long term (current) use of anticoagulants: Secondary | ICD-10-CM

## 2023-10-08 DIAGNOSIS — I480 Paroxysmal atrial fibrillation: Secondary | ICD-10-CM | POA: Diagnosis present

## 2023-10-08 DIAGNOSIS — Z87891 Personal history of nicotine dependence: Secondary | ICD-10-CM | POA: Diagnosis not present

## 2023-10-08 DIAGNOSIS — Z9841 Cataract extraction status, right eye: Secondary | ICD-10-CM

## 2023-10-08 DIAGNOSIS — Z955 Presence of coronary angioplasty implant and graft: Secondary | ICD-10-CM | POA: Diagnosis not present

## 2023-10-08 DIAGNOSIS — E1122 Type 2 diabetes mellitus with diabetic chronic kidney disease: Secondary | ICD-10-CM

## 2023-10-08 DIAGNOSIS — J441 Chronic obstructive pulmonary disease with (acute) exacerbation: Principal | ICD-10-CM | POA: Diagnosis present

## 2023-10-08 DIAGNOSIS — Z961 Presence of intraocular lens: Secondary | ICD-10-CM | POA: Diagnosis present

## 2023-10-08 DIAGNOSIS — G4733 Obstructive sleep apnea (adult) (pediatric): Secondary | ICD-10-CM | POA: Diagnosis present

## 2023-10-08 DIAGNOSIS — E785 Hyperlipidemia, unspecified: Secondary | ICD-10-CM | POA: Diagnosis present

## 2023-10-08 DIAGNOSIS — Z885 Allergy status to narcotic agent status: Secondary | ICD-10-CM

## 2023-10-08 DIAGNOSIS — Z8249 Family history of ischemic heart disease and other diseases of the circulatory system: Secondary | ICD-10-CM | POA: Diagnosis not present

## 2023-10-08 DIAGNOSIS — M810 Age-related osteoporosis without current pathological fracture: Secondary | ICD-10-CM | POA: Diagnosis present

## 2023-10-08 DIAGNOSIS — Z66 Do not resuscitate: Secondary | ICD-10-CM | POA: Diagnosis present

## 2023-10-08 DIAGNOSIS — E119 Type 2 diabetes mellitus without complications: Secondary | ICD-10-CM | POA: Diagnosis present

## 2023-10-08 DIAGNOSIS — Z96651 Presence of right artificial knee joint: Secondary | ICD-10-CM | POA: Diagnosis present

## 2023-10-08 DIAGNOSIS — T380X5A Adverse effect of glucocorticoids and synthetic analogues, initial encounter: Secondary | ICD-10-CM | POA: Diagnosis present

## 2023-10-08 DIAGNOSIS — Z7985 Long-term (current) use of injectable non-insulin antidiabetic drugs: Secondary | ICD-10-CM

## 2023-10-08 DIAGNOSIS — Z947 Corneal transplant status: Secondary | ICD-10-CM | POA: Diagnosis not present

## 2023-10-08 DIAGNOSIS — R0902 Hypoxemia: Secondary | ICD-10-CM | POA: Diagnosis not present

## 2023-10-08 DIAGNOSIS — I110 Hypertensive heart disease with heart failure: Secondary | ICD-10-CM | POA: Diagnosis present

## 2023-10-08 DIAGNOSIS — Z881 Allergy status to other antibiotic agents status: Secondary | ICD-10-CM

## 2023-10-08 DIAGNOSIS — I5032 Chronic diastolic (congestive) heart failure: Secondary | ICD-10-CM | POA: Diagnosis present

## 2023-10-08 DIAGNOSIS — Z1152 Encounter for screening for COVID-19: Secondary | ICD-10-CM | POA: Diagnosis not present

## 2023-10-08 DIAGNOSIS — Z888 Allergy status to other drugs, medicaments and biological substances status: Secondary | ICD-10-CM

## 2023-10-08 DIAGNOSIS — I459 Conduction disorder, unspecified: Secondary | ICD-10-CM | POA: Diagnosis present

## 2023-10-08 DIAGNOSIS — I252 Old myocardial infarction: Secondary | ICD-10-CM

## 2023-10-08 DIAGNOSIS — J9611 Chronic respiratory failure with hypoxia: Secondary | ICD-10-CM | POA: Diagnosis present

## 2023-10-08 DIAGNOSIS — Z9842 Cataract extraction status, left eye: Secondary | ICD-10-CM

## 2023-10-08 DIAGNOSIS — J189 Pneumonia, unspecified organism: Principal | ICD-10-CM

## 2023-10-08 DIAGNOSIS — I4892 Unspecified atrial flutter: Secondary | ICD-10-CM | POA: Diagnosis present

## 2023-10-08 DIAGNOSIS — Z79899 Other long term (current) drug therapy: Secondary | ICD-10-CM

## 2023-10-08 DIAGNOSIS — Z794 Long term (current) use of insulin: Secondary | ICD-10-CM

## 2023-10-08 DIAGNOSIS — Z91013 Allergy to seafood: Secondary | ICD-10-CM

## 2023-10-08 DIAGNOSIS — E1159 Type 2 diabetes mellitus with other circulatory complications: Secondary | ICD-10-CM | POA: Diagnosis present

## 2023-10-08 DIAGNOSIS — Z880 Allergy status to penicillin: Secondary | ICD-10-CM

## 2023-10-08 DIAGNOSIS — G4734 Idiopathic sleep related nonobstructive alveolar hypoventilation: Secondary | ICD-10-CM | POA: Diagnosis not present

## 2023-10-08 DIAGNOSIS — Z23 Encounter for immunization: Secondary | ICD-10-CM

## 2023-10-08 DIAGNOSIS — Z833 Family history of diabetes mellitus: Secondary | ICD-10-CM

## 2023-10-08 DIAGNOSIS — I4891 Unspecified atrial fibrillation: Secondary | ICD-10-CM | POA: Diagnosis present

## 2023-10-08 LAB — COMPREHENSIVE METABOLIC PANEL
ALT: 17 U/L (ref 0–44)
AST: 26 U/L (ref 15–41)
Albumin: 4.3 g/dL (ref 3.5–5.0)
Alkaline Phosphatase: 73 U/L (ref 38–126)
Anion gap: 7 (ref 5–15)
BUN: 25 mg/dL — ABNORMAL HIGH (ref 8–23)
CO2: 35 mmol/L — ABNORMAL HIGH (ref 22–32)
Calcium: 10.4 mg/dL — ABNORMAL HIGH (ref 8.9–10.3)
Chloride: 97 mmol/L — ABNORMAL LOW (ref 98–111)
Creatinine, Ser: 0.86 mg/dL (ref 0.44–1.00)
GFR, Estimated: >60 mL/min (ref >60)
Glucose, Bld: 187 mg/dL — ABNORMAL HIGH (ref 70–99)
Potassium: 4.4 mmol/L (ref 3.5–5.1)
Sodium: 139 mmol/L (ref 135–145)
Total Bilirubin: 0.6 mg/dL (ref 0.3–1.2)
Total Protein: 7.9 g/dL (ref 6.5–8.1)

## 2023-10-08 LAB — SARS CORONAVIRUS 2 BY RT PCR: SARS Coronavirus 2 by RT PCR: NEGATIVE

## 2023-10-08 LAB — CBC WITH DIFFERENTIAL/PLATELET
Abs Immature Granulocytes: 0.01 10*3/uL (ref 0.00–0.07)
Basophils Absolute: 0 10*3/uL (ref 0.0–0.1)
Basophils Relative: 1 %
Eosinophils Absolute: 0.3 10*3/uL (ref 0.0–0.5)
Eosinophils Relative: 5 %
HCT: 47.3 % — ABNORMAL HIGH (ref 36.0–46.0)
Hemoglobin: 15.2 g/dL — ABNORMAL HIGH (ref 12.0–15.0)
Immature Granulocytes: 0 %
Lymphocytes Relative: 13 %
Lymphs Abs: 0.8 10*3/uL (ref 0.7–4.0)
MCH: 30 pg (ref 26.0–34.0)
MCHC: 32.1 g/dL (ref 30.0–36.0)
MCV: 93.5 fL (ref 80.0–100.0)
Monocytes Absolute: 0.4 10*3/uL (ref 0.1–1.0)
Monocytes Relative: 6 %
Neutro Abs: 4.8 10*3/uL (ref 1.7–7.7)
Neutrophils Relative %: 75 %
Platelets: 168 10*3/uL (ref 150–400)
RBC: 5.06 MIL/uL (ref 3.87–5.11)
RDW: 13.6 % (ref 11.5–15.5)
WBC: 6.4 10*3/uL (ref 4.0–10.5)
nRBC: 0 % (ref 0.0–0.2)

## 2023-10-08 LAB — HEMOGLOBIN A1C
Hgb A1c MFr Bld: 7.5 % — ABNORMAL HIGH (ref 4.8–5.6)
Mean Plasma Glucose: 168.55 mg/dL

## 2023-10-08 LAB — TROPONIN I (HIGH SENSITIVITY)
Troponin I (High Sensitivity): 17 ng/L (ref <18)
Troponin I (High Sensitivity): 13 ng/L (ref <18)

## 2023-10-08 LAB — GLUCOSE, CAPILLARY
Glucose-Capillary: 279 mg/dL — ABNORMAL HIGH (ref 70–99)
Glucose-Capillary: 235 mg/dL — ABNORMAL HIGH (ref 70–99)

## 2023-10-08 LAB — BRAIN NATRIURETIC PEPTIDE: B Natriuretic Peptide: 222.7 pg/mL — ABNORMAL HIGH (ref 0.0–100.0)

## 2023-10-08 MED ORDER — TORSEMIDE 20 MG PO TABS
40.0000 mg | ORAL_TABLET | Freq: Two times a day (BID) | ORAL | Status: DC
  Administered 2023-10-08 – 2023-10-11 (×6): 40 mg via ORAL
  Filled 2023-10-08 (×7): qty 2

## 2023-10-08 MED ORDER — ALBUTEROL SULFATE (2.5 MG/3ML) 0.083% IN NEBU
INHALATION_SOLUTION | RESPIRATORY_TRACT | Status: AC
  Administered 2023-10-08: 2.5 mg via RESPIRATORY_TRACT
  Filled 2023-10-08: qty 3

## 2023-10-08 MED ORDER — ALBUTEROL SULFATE (2.5 MG/3ML) 0.083% IN NEBU: 2.5000 mg | INHALATION_SOLUTION | Freq: Once | RESPIRATORY_TRACT | Status: AC

## 2023-10-08 MED ORDER — TRAZODONE HCL 50 MG PO TABS
25.0000 mg | ORAL_TABLET | Freq: Every evening | ORAL | Status: DC | PRN
  Administered 2023-10-08 – 2023-10-10 (×3): 25 mg via ORAL
  Filled 2023-10-08 (×3): qty 1

## 2023-10-08 MED ORDER — ALBUTEROL SULFATE (2.5 MG/3ML) 0.083% IN NEBU: 2.5000 mg | INHALATION_SOLUTION | RESPIRATORY_TRACT | Status: DC | PRN

## 2023-10-08 MED ORDER — ALBUTEROL SULFATE (2.5 MG/3ML) 0.083% IN NEBU
2.5000 mg | INHALATION_SOLUTION | Freq: Once | RESPIRATORY_TRACT | Status: AC
  Administered 2023-10-08: 2.5 mg via RESPIRATORY_TRACT
  Filled 2023-10-08: qty 3

## 2023-10-08 MED ORDER — INSULIN ASPART 100 UNIT/ML IJ SOLN
0.0000 [IU] | Freq: Every day | INTRAMUSCULAR | Status: DC
  Administered 2023-10-08: 2 [IU] via SUBCUTANEOUS
  Administered 2023-10-09: 5 [IU] via SUBCUTANEOUS
  Administered 2023-10-10: 3 [IU] via SUBCUTANEOUS

## 2023-10-08 MED ORDER — IPRATROPIUM-ALBUTEROL 0.5-2.5 (3) MG/3ML IN SOLN: 3.0000 mL | Freq: Once | RESPIRATORY_TRACT | Status: AC

## 2023-10-08 MED ORDER — SODIUM CHLORIDE 0.9 % IV SOLN
500.0000 mg | Freq: Once | INTRAVENOUS | Status: AC
  Administered 2023-10-08: 500 mg via INTRAVENOUS
  Filled 2023-10-08: qty 5

## 2023-10-08 MED ORDER — AMLODIPINE BESYLATE 5 MG PO TABS
5.0000 mg | ORAL_TABLET | Freq: Every day | ORAL | Status: DC
  Administered 2023-10-09 – 2023-10-11 (×3): 5 mg via ORAL
  Filled 2023-10-08 (×3): qty 1

## 2023-10-08 MED ORDER — ONDANSETRON HCL 4 MG/2ML IJ SOLN: 4.0000 mg | Freq: Four times a day (QID) | INTRAMUSCULAR | Status: DC | PRN

## 2023-10-08 MED ORDER — IPRATROPIUM-ALBUTEROL 0.5-2.5 (3) MG/3ML IN SOLN
3.0000 mL | Freq: Four times a day (QID) | RESPIRATORY_TRACT | Status: DC
  Administered 2023-10-08 – 2023-10-09 (×3): 3 mL via RESPIRATORY_TRACT
  Filled 2023-10-08 (×3): qty 3

## 2023-10-08 MED ORDER — METHYLPREDNISOLONE SODIUM SUCC 125 MG IJ SOLR
125.0000 mg | Freq: Once | INTRAMUSCULAR | Status: AC
  Administered 2023-10-08: 125 mg via INTRAVENOUS
  Filled 2023-10-08: qty 2

## 2023-10-08 MED ORDER — CARVEDILOL 6.25 MG PO TABS
6.2500 mg | ORAL_TABLET | Freq: Two times a day (BID) | ORAL | Status: DC
  Administered 2023-10-08 – 2023-10-11 (×6): 6.25 mg via ORAL
  Filled 2023-10-08 (×6): qty 1

## 2023-10-08 MED ORDER — INSULIN ASPART 100 UNIT/ML IJ SOLN
0.0000 [IU] | Freq: Three times a day (TID) | INTRAMUSCULAR | Status: DC
  Administered 2023-10-08: 8 [IU] via SUBCUTANEOUS
  Administered 2023-10-09: 5 [IU] via SUBCUTANEOUS
  Administered 2023-10-09: 8 [IU] via SUBCUTANEOUS
  Administered 2023-10-09: 11 [IU] via SUBCUTANEOUS
  Administered 2023-10-10: 8 [IU] via SUBCUTANEOUS
  Administered 2023-10-10: 15 [IU] via SUBCUTANEOUS
  Administered 2023-10-10: 5 [IU] via SUBCUTANEOUS
  Administered 2023-10-11: 8 [IU] via SUBCUTANEOUS
  Administered 2023-10-11: 5 [IU] via SUBCUTANEOUS

## 2023-10-08 MED ORDER — ONDANSETRON HCL 4 MG PO TABS: 4.0000 mg | ORAL_TABLET | Freq: Four times a day (QID) | ORAL | Status: DC | PRN

## 2023-10-08 MED ORDER — INFLUENZA VAC A&B SURF ANT ADJ 0.5 ML IM SUSY
0.5000 mL | PREFILLED_SYRINGE | INTRAMUSCULAR | Status: AC
  Administered 2023-10-11: 0.5 mL via INTRAMUSCULAR
  Filled 2023-10-08: qty 0.5

## 2023-10-08 MED ORDER — SODIUM CHLORIDE 0.9 % IV SOLN
500.0000 mg | INTRAVENOUS | Status: DC
  Administered 2023-10-09 – 2023-10-10 (×2): 500 mg via INTRAVENOUS
  Filled 2023-10-08 (×3): qty 5

## 2023-10-08 MED ORDER — IPRATROPIUM-ALBUTEROL 0.5-2.5 (3) MG/3ML IN SOLN
RESPIRATORY_TRACT | Status: AC
  Administered 2023-10-08: 3 mL via RESPIRATORY_TRACT
  Filled 2023-10-08: qty 3

## 2023-10-08 MED ORDER — ACETAMINOPHEN 650 MG RE SUPP: 650.0000 mg | Freq: Four times a day (QID) | RECTAL | Status: DC | PRN

## 2023-10-08 MED ORDER — FLUTICASONE PROPIONATE 50 MCG/ACT NA SUSP
1.0000 | Freq: Every day | NASAL | Status: DC | PRN
  Administered 2023-10-10: 1 via NASAL
  Filled 2023-10-08: qty 16

## 2023-10-08 MED ORDER — APIXABAN 5 MG PO TABS
5.0000 mg | ORAL_TABLET | Freq: Two times a day (BID) | ORAL | Status: DC
  Administered 2023-10-08 – 2023-10-11 (×6): 5 mg via ORAL
  Filled 2023-10-08 (×6): qty 1

## 2023-10-08 MED ORDER — METHYLPREDNISOLONE SODIUM SUCC 40 MG IJ SOLR
40.0000 mg | Freq: Two times a day (BID) | INTRAMUSCULAR | Status: DC
  Administered 2023-10-08 – 2023-10-10 (×4): 40 mg via INTRAVENOUS
  Filled 2023-10-08 (×4): qty 1

## 2023-10-08 MED ORDER — IPRATROPIUM-ALBUTEROL 0.5-2.5 (3) MG/3ML IN SOLN
3.0000 mL | Freq: Once | RESPIRATORY_TRACT | Status: AC
  Administered 2023-10-08: 3 mL via RESPIRATORY_TRACT
  Filled 2023-10-08: qty 3

## 2023-10-08 MED ORDER — ACETAMINOPHEN 325 MG PO TABS
650.0000 mg | ORAL_TABLET | Freq: Four times a day (QID) | ORAL | Status: DC | PRN
  Administered 2023-10-08 – 2023-10-10 (×3): 650 mg via ORAL
  Filled 2023-10-08 (×3): qty 2

## 2023-10-08 NOTE — ED Provider Notes (Signed)
[place] EMERGENCY DEPARTMENT AT [facility] Provider Note   CSN: [id number] Arrival date & time: 10/08/23  0830     History  Chief Complaint  Patient presents with   Shortness of Breath    [name] is a 89 y.o. female.   Shortness of Breath 89 year old female history of atrial fibrillation or atrial flutter on Eliquis, CHF, CAD, COPD, hypertension, type 2 diabetes, hyperlipidemia presenting for shortness of breath.  She is felt short of breath for few days.  She has had some orthopnea, and she has also gained about 7 pounds patient she has not had a leg swelling, and has been taking her diuretic.  She takes Eliquis and has been adherent to this.  No missed doses.  She received a breathing treatment prior to my evaluation is feeling somewhat better.  Denies any recent wheezing, cough, fevers.  She has had some chest discomfort which is left-sided and nonradiating for about a week.  Nonexertional, seems to be random.  No dizziness or lightheadedness.     Home Medications Prior to Admission medications   Medication Sig Start Date End Date Taking? Authorizing Provider  acetaminophen (TYLENOL) 325 MG tablet Take 2 tablets (650 mg total) by mouth every 6 (six) hours as needed for mild pain or headache. 02/05/21  Yes Sheikh, Omair Latif, DO  amLODipine (NORVASC) 5 MG tablet TAKE 1 TABLET BY MOUTH DAILY 08/18/23  Yes Bensimhon, Bevelyn Buckles, MD  carvedilol (COREG) 6.25 MG tablet TAKE 1 TABLET BY MOUTH TWICE  DAILY WITH MEALS 01/14/23  Yes Bensimhon, Bevelyn Buckles, MD  Continuous Glucose Receiver (FREESTYLE LIBRE 3 READER) DEVI Use to check glucose continuously. 07/11/23  Yes Carlus Pavlov, MD  Continuous Glucose Sensor (FREESTYLE LIBRE 3 SENSOR) MISC Place 1 sensor on the skin every 14 days. Use to check glucose continuously 07/11/23  Yes Carlus Pavlov, MD  ELIQUIS 5 MG TABS tablet TAKE 1 TABLET BY MOUTH TWICE  DAILY 08/30/23  Yes Bensimhon, Bevelyn Buckles, MD  fluticasone (FLONASE)  50 MCG/ACT nasal spray Place 1 spray into both nostrils daily as needed for allergies or rhinitis. 06/02/23  Yes Coralyn Helling, MD  insulin aspart (NOVOLOG FLEXPEN) 100 UNIT/ML FlexPen Inject 4-6 Units into the skin 3 (three) times daily before meals. 06/23/23  Yes Carlus Pavlov, MD  Multiple Vitamins-Minerals (CENTRUM SILVER ULTRA WOMENS PO) Take 1 tablet by mouth daily.   Yes [provider]  nitroGLYCERIN (NITROSTAT) 0.4 MG SL tablet Place 1 tablet (0.4 mg total) under the tongue every 5 (five) minutes as needed for chest pain. Reported on 01/28/2016 08/04/22  Yes Bensimhon, Bevelyn Buckles, MD  OXYGEN Inhale 3 Doses into the lungs as needed. 3 liters   Yes [provider]  potassium chloride SA (KLOR-CON M) 20 MEQ tablet TAKE 2 TABLETS BY MOUTH DAILY 06/21/23  Yes Bensimhon, Bevelyn Buckles, MD  Semaglutide,0.25 or 0.5MG /DOS, (OZEMPIC, 0.25 OR 0.5 MG/DOSE,) 2 MG/1.5ML SOPN Inject 0.5 mg into the skin once a week. 03/26/22  Yes Carlus Pavlov, MD  torsemide (DEMADEX) 20 MG tablet TAKE 2 TABLETS BY MOUTH TWICE  DAILY 08/18/23  Yes Bensimhon, Bevelyn Buckles, MD  Vitamin D, Cholecalciferol, 25 MCG (1000 UT) CAPS Take 1 capsule by mouth daily.   Yes [provider]  Azelastine HCl (ASTEPRO) 0.15 % SOLN Place 1 spray into the nose daily as needed. Patient not taking: Reported on 10/08/2023 06/02/23   Coralyn Helling, MD  Blood Glucose Monitoring Suppl (ONETOUCH VERIO) w/Device KIT Use as instructed to  check blood sugar 09/16/22   Carlus Pavlov, MD  insulin aspart protamine- aspart (NOVOLOG MIX 70/30) (70-30) 100 UNIT/ML injection Inject 14 Units into the skin daily with breakfast. 12 units in the PM Patient not taking: Reported on 10/08/2023    [provider]  insulin glargine (LANTUS SOLOSTAR) 100 UNIT/ML Solostar Pen Inject 15 Units into the skin daily. Patient not taking: Reported on 10/08/2023 06/23/23   Carlus Pavlov, MD  Insulin Pen Needle (BD PEN NEEDLE NANO 2ND GEN) 32G X 4 MM  MISC Use 3x a day as instructed 08/18/23   Carlus Pavlov, MD  Lancet Devices (LANCING DEVICE) MISC Use as advised - for [facility] Lancets 09/14/21   Carlus Pavlov, MD  Lancets [facility] DELICA PLUS [place]) MISC USE TO MONITOR GLUCOSE  LEVELS TWICE DAILY 05/03/20   Romero Belling, MD  [facility] VERIO test strip CHECK BLOOD SUGAR TWICE DAILY 06/21/23   Carlus Pavlov, MD      Allergies    Levaquin [levofloxacin], Lobster [shellfish allergy], Penicillins, Valsartan, Codeine, Lipitor [atorvastatin], Oxycodone, Statins, Tramadol, Amlodipine besylate, Crestor [rosuvastatin calcium], and Tramadol hcl    Review of Systems   Review of Systems  Respiratory:  Positive for shortness of breath.   Review of systems completed and notable as per HPI.  ROS otherwise negative.   Physical Exam Updated Vital Signs BP (!) 157/59 (BP Location: Left Arm)   Pulse 70   Temp (!) 97.5 F (36.4 C) (Oral)   Resp 20   Wt 70.6 kg   SpO2 91%   BMI 27.58 kg/m  Physical Exam Vitals and nursing note reviewed.  Constitutional:      General: She is not in acute distress.    Appearance: She is well-developed.  HENT:     Head: Normocephalic and atraumatic.  Eyes:     Extraocular Movements: Extraocular movements intact.     Conjunctiva/sclera: Conjunctivae normal.     Pupils: Pupils are equal, round, and reactive to light.  Cardiovascular:     Rate and Rhythm: Normal rate and regular rhythm.     Heart sounds: No murmur heard. Pulmonary:     Effort: Pulmonary effort is normal. No respiratory distress.     Breath sounds: Decreased breath sounds and wheezing present.  Abdominal:     Palpations: Abdomen is soft.     Tenderness: There is no abdominal tenderness.  Musculoskeletal:        General: No swelling.     Cervical back: Neck supple.     Right lower leg: No edema.     Left lower leg: No edema.  Skin:    General: Skin is warm and dry.     Capillary Refill: Capillary refill takes less than 2 seconds.   Neurological:     Mental Status: She is alert.  Psychiatric:        Mood and Affect: Mood normal.     ED Results / Procedures / Treatments   Labs (all labs ordered are listed, but only abnormal results are displayed) Labs Reviewed  COMPREHENSIVE METABOLIC PANEL - Abnormal; Notable for the following components:      Result Value   Chloride 97 (*)    CO2 35 (*)    Glucose, Bld 187 (*)    BUN 25 (*)    Calcium 10.4 (*)    All other components within normal limits  CBC WITH DIFFERENTIAL/PLATELET - Abnormal; Notable for the following components:   Hemoglobin 15.2 (*)    HCT 47.3 (*)  All other components within normal limits  BRAIN NATRIURETIC PEPTIDE - Abnormal; Notable for the following components:   B Natriuretic Peptide 222.7 (*)    All other components within normal limits  SARS CORONAVIRUS 2 BY RT PCR  HEMOGLOBIN A1C  TROPONIN I (HIGH SENSITIVITY)  TROPONIN I (HIGH SENSITIVITY)    EKG None  Radiology DG Chest Port 1 View  Result Date: 10/08/2023 CLINICAL DATA:  89 year old female with history of shortness of breath. EXAM: PORTABLE CHEST 1 VIEW COMPARISON:  Chest x-ray 01/18/2023. FINDINGS: Lung volumes are is increased with emphysematous changes. Interstitial prominence and patchy ill-defined opacities in the lower lobes of the lungs bilaterally (left-greater-than-right). No pleural effusions. No pneumothorax. No evidence of pulmonary edema. Heart size is mildly enlarged. Upper mediastinal contours are within normal limits. IMPRESSION: 1. Bibasilar opacities concerning for potential pneumonia or aspiration pneumonitis (left-greater-than-right). 2. Aortic atherosclerosis. 3. Mild cardiomegaly. 4. Emphysema. Electronically Signed   By: Trudie Reed M.D.   On: 10/08/2023 11:11    Procedures Procedures    Medications Ordered in ED Medications  amLODipine (NORVASC) tablet 5 mg (has no administration in time range)  carvedilol (COREG) tablet 6.25 mg (has no  administration in time range)  torsemide (DEMADEX) tablet 40 mg (has no administration in time range)  apixaban (ELIQUIS) tablet 5 mg (has no administration in time range)  fluticasone (FLONASE) 50 MCG/ACT nasal spray 1 spray (has no administration in time range)  methylPREDNISolone sodium succinate (SOLU-MEDROL) 40 mg/mL injection 40 mg (has no administration in time range)  ipratropium-albuterol (DUONEB) 0.5-2.5 (3) MG/3ML nebulizer solution 3 mL (3 mLs Nebulization Given 10/08/23 1511)  insulin aspart (novoLOG) injection 0-15 Units (has no administration in time range)  insulin aspart (novoLOG) injection 0-5 Units (has no administration in time range)  acetaminophen (TYLENOL) tablet 650 mg (has no administration in time range)    Or  acetaminophen (TYLENOL) suppository 650 mg (has no administration in time range)  traZODone (DESYREL) tablet 25 mg (has no administration in time range)  ondansetron (ZOFRAN) tablet 4 mg (has no administration in time range)    Or  ondansetron (ZOFRAN) injection 4 mg (has no administration in time range)  albuterol (PROVENTIL) (2.5 MG/3ML) 0.083% nebulizer solution 2.5 mg (has no administration in time range)  azithromycin (ZITHROMAX) 500 mg in sodium chloride 0.9 % 250 mL IVPB (has no administration in time range)  ipratropium-albuterol (DUONEB) 0.5-2.5 (3) MG/3ML nebulizer solution 3 mL (3 mLs Nebulization Given 10/08/23 0905)  albuterol (PROVENTIL) (2.5 MG/3ML) 0.083% nebulizer solution 2.5 mg (2.5 mg Nebulization Given 10/08/23 0906)  ipratropium-albuterol (DUONEB) 0.5-2.5 (3) MG/3ML nebulizer solution 3 mL (3 mLs Nebulization Given 10/08/23 1028)  albuterol (PROVENTIL) (2.5 MG/3ML) 0.083% nebulizer solution 2.5 mg (2.5 mg Nebulization Given 10/08/23 1029)  methylPREDNISolone sodium succinate (SOLU-MEDROL) 125 mg/2 mL injection 125 mg (125 mg Intravenous Given 10/08/23 1302)  azithromycin (ZITHROMAX) 500 mg in sodium chloride 0.9 % 250 mL IVPB (500 mg  Intravenous New Bag/Given 10/08/23 1324)    ED Course/ Medical Decision Making/ A&P Clinical Course as of 10/08/23 1615  Sat Oct 08, 2023  1324 Last cath May 2021 showed severe multivessel CAD, received a stent at the time as well as atherectomy.  Also has severe pulmonary hypertension. [JD]  1007 EKGs reviewed.  Shows a bundle branch pattern.  She appears to have P waves irregularly, prior EKG showed atrial flutter with these EKGs.  Show intermittent P waves with possible prolonged PR interval before dropping which could be heart block  second degree type I. [JD]  1201 On reassessment she is feeling better after breathing treatment.  Her x-ray reviewed by myself is concerning for possible bibasilar pneumonia.  Unfortunate she has multiple allergies, will treat with azithromycin, Solu-Medrol.  She is mildly tachypneic although on her baseline ox requirement.  However given slightly increased work of breathing and tachypnea will plan for admission especially with age and significant COPD. [JD]    Clinical Course User Index [JD] Laurence Spates, MD                                 Medical Decision Making Amount and/or Complexity of Data Reviewed Labs: ordered. Radiology: ordered.  Risk Prescription drug management. Decision regarding hospitalization.   Medical Decision Making:   KAMBRIE EDDLEMAN is a 89 y.o. female who presented to the ED today with shortness of breath, chest discomfort.  Vital signs reviewed noted for mild tachypnea.  She is normally on 3 L of oxygen as needed and at night.  On my evaluation she is satting okay on 3 L.  She has diminished breath sounds bilaterally and slight expiratory wheezing.  She also has had recent unintentional weight gain.  Suspect possible COPD/CHF exacerbation.  She is already received breathing treatments here with some improvement.  Her EKG appears to show secondary heart block type I although somewhat difficult to assess.  Will repeat here.  Her  last EKG showed atrial flutter.  Consider PE although she is already anticoagulated without DVT symptoms lower concern for this.  I do not see any signs of acute ischemia on her EKG, she has had some chest discomfort, will obtain troponin for evaluation possible ACS.  Low suspicion for dissection.   Patient placed on continuous vitals and telemetry monitoring while in ED which was reviewed periodically.  Reviewed and confirmed nursing documentation for past medical history, family history, social history.   Reassessment and Plan:   On reassessment she is feeling better after breathing treatment.  I reviewed her chest x-ray, concerning for pneumonia.  She is significant allergies, azithromycin ordered.  She still has some tachypnea, likely from COPD and pneumonia.  Given this, patient discussed with Dr. Kirby Crigler and admitted for further management.   Patient's presentation is most consistent with acute complicated illness / injury requiring diagnostic workup.           Final Clinical Impression(s) / ED Diagnoses Final diagnoses:  Community acquired pneumonia, unspecified laterality    Rx / DC Orders ED Discharge Orders     None         Laurence Spates, MD 10/08/23 1615

## 2023-10-08 NOTE — Progress Notes (Signed)
Pt stated that she had a lot of relief from the 2 breathing treatments she received. Pt asked if should could have nebulizer treatment at home. RT told the Pt to call Dr. Craige Cotta (pulmonologist) and ask for a nebulizer

## 2023-10-08 NOTE — ED Notes (Signed)
Monisha with cl called for transport

## 2023-10-08 NOTE — ED Notes (Addendum)
Pt had urine incontinent episode in bed. Pt cleaned, placed in new gown and brief and linens changed. Call bell placed within reach. No concerns at this time.

## 2023-10-08 NOTE — ED Triage Notes (Signed)
She c/o increasing shob and some recent weight gain. She tells me she has CHF and has recently been unable to weigh herself to adjust her diuretic d/t "the scale broke" [sic]. She is met upon arrival by our R.T., Robin, who initiates a h.h.n. treatment. As I write this, her last statement to me was "I'm feeling better already".

## 2023-10-08 NOTE — H&P (Signed)
History and Physical  [name] WGN:[id number] DOB: Aug 21, 1931 DOA: 10/08/2023  PCP: Pincus Sanes, MD   Chief Complaint: Shortness of breath  HPI: [name] is a 89 y.o. female with medical history significant for type 2 diabetes, hypertension, CAD, COPD on nocturnal oxygen being admitted to the hospital with dyspnea and likely due to acute exacerbation of COPD and possible community-acquired pneumonia.  The patient states that she has been in her usual state of health until the last approximately 48 hours, when she noticed dyspnea.  This is worsened with lying flat, but also worsened with exertion.  She denies any chest pain, though she does intermittently get a slight feeling of chest tightness.  She denies any cough, fever, trouble swallowing.  She does endorse some wheezing.  Typically, she wears 2 L oxygen when sleeping, or when ambulating in her [facility] community.  She has been following the same routine there, has not had to turn up her oxygen or wear it more frequently.  ED Course: Vitals in the emergency department are unremarkable, she has been saturating well on her baseline 3 L nasal cannula oxygen.  Lab work including CBC and CMP relatively unremarkable, troponin negative.  BNP 222.  Negative, chest x-ray as noted below with possible consolidation.  Review of Systems: Please see HPI for pertinent positives and negatives. A complete 10 system review of systems are otherwise negative.  Past Medical History:  Diagnosis Date   Anemia    iron defficiency   Anginal pain (HCC)    Arthritis    "knees, feet, hands; joints" (05/27/2015)   Asthma    Atrial fibrillation or flutter    s/p RFCA 7/08;   s/p DCCV in past;   previously on amiodarone;  amio stopped due to lung toxicity   CAD (coronary artery disease)    s/p NSTEMI tx with BMS to OM1 3/08;  cath 3/08: pOM 99% tx with PCI, pLAD 20%, ? mod stenosis at the AM   CAP (community acquired pneumonia) 05/24/2015    CHF (congestive heart failure) (HCC)    Chronic diastolic heart failure (HCC)    echo 11/11:  EF 55-60%, severe LVH, mod LAE, mild MR, mildly increased PASP   COPD (chronic obstructive pulmonary disease) (HCC)    Degenerative joint disease    Dystrophy, corneal stromal    Heart murmur    HLD (hyperlipidemia)    HTN (hypertension)    essential nos   Hypopotassemia    PMH of   Muscle pain    Myocardial infarction (HCC) 2008   OSA on CPAP    Osteoporosis    Pneumonia 05/27/2015   Protein calorie malnutrition (HCC)    Rash and nonspecific skin eruption    both arms,awaiting bio   dr Emily Filbert   Seasonal allergies    Shortness of breath dyspnea    Type II diabetes mellitus (HCC)    Dr Everardo All   Past Surgical History:  Procedure Laterality Date   A FLUTTER ABLATION     Dr Ladona Ridgel   ABDOMINAL AORTOGRAM W/LOWER EXTREMITY Bilateral 04/11/2020   Procedure: ABDOMINAL AORTOGRAM W/LOWER EXTREMITY;  Surgeon: Chuck Hint, MD;  Location: [facility] INVASIVE CV LAB;  Service: Cardiovascular;  Laterality: Bilateral;   CATARACT EXTRACTION W/ INTRAOCULAR LENS  IMPLANT, BILATERAL Bilateral    CHOLECYSTECTOMY     COLONOSCOPY  6/07   2 polyps Dr. Elvina Mattes in [place]   colonoscopy with polypectomy      X 2; [facility]  CORNEAL TRANSPLANT Bilateral    CORONARY ANGIOPLASTY WITH STENT PLACEMENT  02/2007   BMS w/Dr Ladona Ridgel   CORONARY ATHERECTOMY N/A 05/16/2020   Procedure: CORONARY ATHERECTOMY;  Surgeon: Swaziland, Peter M, MD;  Location: [facility] INVASIVE CV LAB;  Service: Cardiovascular;  Laterality: N/A;   CORONARY BALLOON ANGIOPLASTY N/A 05/16/2020   Procedure: CORONARY BALLOON ANGIOPLASTY;  Surgeon: Swaziland, Peter M, MD;  Location: [facility] INVASIVE CV LAB;  Service: Cardiovascular;  Laterality: N/A;   CORONARY STENT INTERVENTION N/A 05/16/2020   Procedure: CORONARY STENT INTERVENTION;  Surgeon: Swaziland, Peter M, MD;  Location: [facility] INVASIVE CV LAB;  Service: Cardiovascular;  Laterality: N/A;   CORONARY ULTRASOUND/IVUS N/A  05/16/2020   Procedure: Intravascular Ultrasound/IVUS;  Surgeon: Swaziland, Peter M, MD;  Location: [facility] INVASIVE CV LAB;  Service: Cardiovascular;  Laterality: N/A;   EYE SURGERY     gravid 2 para 2     KNEE CARTILAGE SURGERY Right    LEFT AND RIGHT HEART CATHETERIZATION WITH CORONARY ANGIOGRAM N/A 05/07/2014   Procedure: LEFT AND RIGHT HEART CATHETERIZATION WITH CORONARY ANGIOGRAM;  Surgeon: Peter M Swaziland, MD;  Location: [facility] CATH LAB;  Service: Cardiovascular;  Laterality: N/A;   PERIPHERAL VASCULAR INTERVENTION  04/11/2020   Procedure: PERIPHERAL VASCULAR INTERVENTION;  Surgeon: Chuck Hint, MD;  Location: [facility] INVASIVE CV LAB;  Service: Cardiovascular;;   RIGHT HEART CATH N/A 02/04/2021   Procedure: RIGHT HEART CATH;  Surgeon: Dolores Patty, MD;  Location: [facility] INVASIVE CV LAB;  Service: Cardiovascular;  Laterality: N/A;   RIGHT/LEFT HEART CATH AND CORONARY ANGIOGRAPHY N/A 05/16/2020   Procedure: RIGHT/LEFT HEART CATH AND CORONARY ANGIOGRAPHY;  Surgeon: Dolores Patty, MD;  Location: [facility] INVASIVE CV LAB;  Service: Cardiovascular;  Laterality: N/A;   TOTAL KNEE ARTHROPLASTY Right 06/28/2016   Procedure: TOTAL KNEE ARTHROPLASTY;  Surgeon: Gean Birchwood, MD;  Location: [facility] OR;  Service: Orthopedics;  Laterality: Right;    Social History:  reports that she quit smoking about 54 years ago. Her smoking use included cigarettes. She started smoking about 72 years ago. She has a 4.5 pack-year smoking history. She has never used smokeless tobacco. She reports current alcohol use. She reports that she does not use drugs.   Allergies  Allergen Reactions   Levaquin [Levofloxacin] Shortness Of Breath and Swelling    angioedema   Lobster [Shellfish Allergy] Other (See Comments)    angioedema   Penicillins Rash    Has patient had a PCN reaction causing immediate rash, facial/tongue/throat swelling, SOB or lightheadedness with hypotension: Yes Has patient had a PCN reaction causing severe rash  involving mucus membranes or skin necrosis: No Has patient had a PCN reaction that required hospitalization: No Has patient had a PCN reaction occurring within the last 10 years: No If all of the above answers are "NO", then may proceed with Cephalosporin use.   Valsartan Other (See Comments)    REACTION: angioedema   Codeine Other (See Comments)    Mental status changes   Lipitor [Atorvastatin] Other (See Comments)    weakness   Oxycodone Other (See Comments)    hallucinations   Statins     Made her too weak   Tramadol    Amlodipine Besylate Other (See Comments)    REACTION: tingling in lips & gum edema 7/12: talked to patient, states she is tolerating well   Crestor [Rosuvastatin Calcium] Rash   Tramadol Hcl Nausea And Vomiting    Family History  Problem Relation Age of Onset   Diabetes Mother  Hypertension Mother    Transient ischemic attack Mother    Arthritis Mother    Heart attack Father 93   Arthritis Father    Hypertension Father    Breast cancer Maternal Aunt    Arthritis Maternal Aunt      Prior to Admission medications   Medication Sig Start Date End Date Taking? Authorizing Provider  acetaminophen (TYLENOL) 325 MG tablet Take 2 tablets (650 mg total) by mouth every 6 (six) hours as needed for mild pain or headache. 02/05/21  Yes Sheikh, Omair Latif, DO  amLODipine (NORVASC) 5 MG tablet TAKE 1 TABLET BY MOUTH DAILY 08/18/23  Yes Bensimhon, Bevelyn Buckles, MD  carvedilol (COREG) 6.25 MG tablet TAKE 1 TABLET BY MOUTH TWICE  DAILY WITH MEALS 01/14/23  Yes Bensimhon, Bevelyn Buckles, MD  Continuous Glucose Receiver (FREESTYLE LIBRE 3 READER) DEVI Use to check glucose continuously. 07/11/23  Yes Carlus Pavlov, MD  Continuous Glucose Sensor (FREESTYLE LIBRE 3 SENSOR) MISC Place 1 sensor on the skin every 14 days. Use to check glucose continuously 07/11/23  Yes Carlus Pavlov, MD  ELIQUIS 5 MG TABS tablet TAKE 1 TABLET BY MOUTH TWICE  DAILY 08/30/23  Yes Bensimhon, Bevelyn Buckles, MD   fluticasone (FLONASE) 50 MCG/ACT nasal spray Place 1 spray into both nostrils daily as needed for allergies or rhinitis. 06/02/23  Yes Coralyn Helling, MD  insulin aspart (NOVOLOG FLEXPEN) 100 UNIT/ML FlexPen Inject 4-6 Units into the skin 3 (three) times daily before meals. 06/23/23  Yes Carlus Pavlov, MD  Multiple Vitamins-Minerals (CENTRUM SILVER ULTRA WOMENS PO) Take 1 tablet by mouth daily.   Yes [provider]  nitroGLYCERIN (NITROSTAT) 0.4 MG SL tablet Place 1 tablet (0.4 mg total) under the tongue every 5 (five) minutes as needed for chest pain. Reported on 01/28/2016 08/04/22  Yes Bensimhon, Bevelyn Buckles, MD  OXYGEN Inhale 3 Doses into the lungs as needed. 3 liters   Yes [provider]  potassium chloride SA (KLOR-CON M) 20 MEQ tablet TAKE 2 TABLETS BY MOUTH DAILY 06/21/23  Yes Bensimhon, Bevelyn Buckles, MD  Semaglutide,0.25 or 0.5MG /DOS, (OZEMPIC, 0.25 OR 0.5 MG/DOSE,) 2 MG/1.5ML SOPN Inject 0.5 mg into the skin once a week. 03/26/22  Yes Carlus Pavlov, MD  torsemide (DEMADEX) 20 MG tablet TAKE 2 TABLETS BY MOUTH TWICE  DAILY 08/18/23  Yes Bensimhon, Bevelyn Buckles, MD  Vitamin D, Cholecalciferol, 25 MCG (1000 UT) CAPS Take 1 capsule by mouth daily.   Yes [provider]  Azelastine HCl (ASTEPRO) 0.15 % SOLN Place 1 spray into the nose daily as needed. Patient not taking: Reported on 10/08/2023 06/02/23   Coralyn Helling, MD  Blood Glucose Monitoring Suppl [facility] VERIO) w/Device KIT Use as instructed to check blood sugar 09/16/22   Carlus Pavlov, MD  insulin aspart protamine- aspart (NOVOLOG MIX 70/30) (70-30) 100 UNIT/ML injection Inject 14 Units into the skin daily with breakfast. 12 units in the PM Patient not taking: Reported on 10/08/2023    [provider]  insulin glargine (LANTUS SOLOSTAR) 100 UNIT/ML Solostar Pen Inject 15 Units into the skin daily. Patient not taking: Reported on 10/08/2023 06/23/23   Carlus Pavlov, MD  Insulin Pen Needle (BD PEN NEEDLE NANO  2ND GEN) 32G X 4 MM MISC Use 3x a day as instructed 08/18/23   Carlus Pavlov, MD  Lancet Devices (LANCING DEVICE) MISC Use as advised - for [facility] Lancets 09/14/21   Carlus Pavlov, MD  Lancets [facility] DELICA PLUS LANCET33G) MISC USE TO MONITOR GLUCOSE  LEVELS  TWICE DAILY 05/03/20   Romero Belling, MD  [facility] VERIO test strip CHECK BLOOD SUGAR TWICE DAILY 06/21/23   Carlus Pavlov, MD    Physical Exam: BP (!) 157/59 (BP Location: Left Arm)   Pulse 70   Temp (!) 97.5 F (36.4 C) (Oral)   Resp 20   Wt 70.6 kg   SpO2 93%   BMI 27.58 kg/m   General:  Alert, oriented, calm, in no acute distress, speaking in full sentences and no evidence of respiratory distress on 3 L.  She looks much younger than her stated age. Cardiovascular: RRR, no murmurs or rubs, no peripheral edema  Respiratory: clear to auscultation bilaterally but globally diminished, no wheezes, no crackles  Abdomen: soft, nontender, nondistended, normal bowel tones heard  Skin: dry, no rashes  Musculoskeletal: no joint effusions, normal range of motion  Psychiatric: appropriate affect, normal speech  Neurologic: extraocular muscles intact, clear speech, moving all extremities with intact sensorium         Labs on Admission:  Basic Metabolic Panel: Recent Labs  Lab 10/08/23 0927  NA 139  K 4.4  CL 97*  CO2 35*  GLUCOSE 187*  BUN 25*  CREATININE 0.86  CALCIUM 10.4*   Liver Function Tests: Recent Labs  Lab 10/08/23 0927  AST 26  ALT 17  ALKPHOS 73  BILITOT 0.6  PROT 7.9  ALBUMIN 4.3   No results for input(s): "LIPASE", "AMYLASE" in the last 168 hours. No results for input(s): "AMMONIA" in the last 168 hours. CBC: Recent Labs  Lab 10/08/23 0927  WBC 6.4  NEUTROABS 4.8  HGB 15.2*  HCT 47.3*  MCV 93.5  PLT 168   Cardiac Enzymes: No results for input(s): "CKTOTAL", "CKMB", "CKMBINDEX", "TROPONINI" in the last 168 hours.  BNP (last 3 results) Recent Labs    10/08/23 0927  BNP 222.7*     ProBNP (last 3 results) No results for input(s): "PROBNP" in the last 8760 hours.  CBG: No results for input(s): "GLUCAP" in the last 168 hours.  Radiological Exams on Admission: DG Chest Port 1 View  Result Date: 10/08/2023 CLINICAL DATA:  89 year old female with history of shortness of breath. EXAM: PORTABLE CHEST 1 VIEW COMPARISON:  Chest x-ray 01/18/2023. FINDINGS: Lung volumes are is increased with emphysematous changes. Interstitial prominence and patchy ill-defined opacities in the lower lobes of the lungs bilaterally (left-greater-than-right). No pleural effusions. No pneumothorax. No evidence of pulmonary edema. Heart size is mildly enlarged. Upper mediastinal contours are within normal limits. IMPRESSION: 1. Bibasilar opacities concerning for potential pneumonia or aspiration pneumonitis (left-greater-than-right). 2. Aortic atherosclerosis. 3. Mild cardiomegaly. 4. Emphysema. Electronically Signed   By: Trudie Reed M.D.   On: 10/08/2023 11:11    Assessment/Plan [name] is a 89 y.o. female with medical history significant for type 2 diabetes, hypertension, CAD, COPD on nocturnal oxygen being admitted to the hospital with dyspnea and likely due to acute exacerbation of COPD and possible community-acquired pneumonia.   Acute exacerbation of COPD-with exertional dyspnea, wheezing now resolved. -Inpatient admission -Scheduled DuoNeb, as needed albuterol -Patient significantly improved after breathing treatments and IV Solu-Medrol -IV azithromycin -IV Solu-Medrol 40 mg every 12 hours -Incentive spirometry and flutter valve  Chronic hypoxic respiratory failure-due to COPD -Continue supplemental oxygen, with goal O2 saturation greater than 90%  Type 2 diabetes-check hemoglobin A1c, carb controlled diet, moderate dose sliding scale  Paroxysmal atrial fibrillation/flutter-continue Coreg and Eliquis    Code Status: Limited: Do not attempt resuscitation (DNR)  -DNR-LIMITED -Do  Not Intubate/DNI   Consults called: None  Admission status: The appropriate patient status for this patient is INPATIENT. Inpatient status is judged to be reasonable and necessary in order to provide the required intensity of service to ensure the patient's safety. The patient's presenting symptoms, physical exam findings, and initial radiographic and laboratory data in the context of their chronic comorbidities is felt to place them at high risk for further clinical deterioration. Furthermore, it is not anticipated that the patient will be medically stable for discharge from the hospital within 2 midnights of admission.    I certify that at the point of admission it is my clinical judgment that the patient will require inpatient hospital care spanning beyond 2 midnights from the point of admission due to high intensity of service, high risk for further deterioration and high frequency of surveillance required  Time spent: 59 minutes  Arlyce Circle Sharlette Dense MD [facility] Pager [phone]  If 7PM-7AM, please contact night-coverage [email or web address] Password [email or web address]  10/08/2023, 2:56 PM

## 2023-10-09 DIAGNOSIS — G4734 Idiopathic sleep related nonobstructive alveolar hypoventilation: Secondary | ICD-10-CM | POA: Diagnosis not present

## 2023-10-09 DIAGNOSIS — J441 Chronic obstructive pulmonary disease with (acute) exacerbation: Secondary | ICD-10-CM

## 2023-10-09 LAB — RESPIRATORY PANEL BY PCR

## 2023-10-09 LAB — GLUCOSE, CAPILLARY
Glucose-Capillary: 223 mg/dL — ABNORMAL HIGH (ref 70–99)
Glucose-Capillary: 265 mg/dL — ABNORMAL HIGH (ref 70–99)
Glucose-Capillary: 301 mg/dL — ABNORMAL HIGH (ref 70–99)
Glucose-Capillary: 338 mg/dL — ABNORMAL HIGH (ref 70–99)
Glucose-Capillary: 347 mg/dL — ABNORMAL HIGH (ref 70–99)

## 2023-10-09 MED ORDER — NITROGLYCERIN 0.4 MG SL SUBL
0.4000 mg | SUBLINGUAL_TABLET | SUBLINGUAL | Status: DC | PRN
  Administered 2023-10-09 (×2): 0.4 mg via SUBLINGUAL
  Filled 2023-10-09: qty 1

## 2023-10-09 MED ORDER — IPRATROPIUM-ALBUTEROL 0.5-2.5 (3) MG/3ML IN SOLN
3.0000 mL | Freq: Four times a day (QID) | RESPIRATORY_TRACT | Status: DC | PRN
  Administered 2023-10-10: 3 mL via RESPIRATORY_TRACT
  Filled 2023-10-09: qty 3

## 2023-10-09 NOTE — Assessment & Plan Note (Signed)
-   suspected COPD exac; u/k etiology  -Negative RVP; negative for covid - s/p breathing treatments, steroids, azithromycin - did not require any further treatment at discharge - neb machine ordered prior to d/c and duonebs sent to pharmacy

## 2023-10-09 NOTE — Progress Notes (Signed)
NP Garner Nash was secured chat to notify him that patient is c/o chest tightness, 5/10.

## 2023-10-09 NOTE — Plan of Care (Signed)
Problem: Education: Goal: Knowledge of General Education information will improve Description: Including pain rating scale, medication(s)/side effects and non-pharmacologic comfort measures Outcome: Progressing   Problem: Clinical Measurements: Goal: Will remain free from infection Outcome: Progressing

## 2023-10-09 NOTE — Assessment & Plan Note (Signed)
-   Usually on 2 L oxygen at night - Currently on daytime oxygen; should improve slowly - patient instructed to continue daytime O2 as needed and weaning; she has a finger monitor to use at home

## 2023-10-09 NOTE — Plan of Care (Signed)

## 2023-10-09 NOTE — Assessment & Plan Note (Addendum)
-   Last A1c 7.5% on 10/08/2023 -Managed by endocrinology - CBGs elevated with steroid use during hospitalization.  No further steroids continued at discharge - Resume home regimen

## 2023-10-09 NOTE — Progress Notes (Signed)
Mobility Specialist - Progress Note   10/09/23 1324  Oxygen Therapy  SpO2 (!) 87 %  O2 Device Room Air  Patient Activity (if Appropriate) Ambulating  Mobility  Activity Ambulated with assistance in hallway;Ambulated with assistance to bathroom  Level of Assistance Standby assist, set-up cues, supervision of patient - no hands on  Assistive Device Front wheel walker  Distance Ambulated (ft) 160 ft  Activity Response Tolerated well  Mobility Referral Yes  $Mobility charge 1 Mobility  Mobility Specialist Start Time (ACUTE ONLY) 1256  Mobility Specialist Stop Time (ACUTE ONLY) 1321  Mobility Specialist Time Calculation (min) (ACUTE ONLY) 25 min   Nurse requested Mobility Specialist to perform oxygen saturation test with pt which includes removing pt from oxygen both at rest and while ambulating.  Below are the results from that testing.     Patient Saturations on Room Air at Rest = spO2 87%  Patient Saturations on Room Air while Ambulating = sp02 87% .    Patient Saturations on 4 Liters of oxygen while Ambulating = sp02 94%  At end of testing pt left in room on 4  Liters of oxygen.  Reported results to nurse.  Pt received in bed and agreeable to mobility. Prior to ambulating, pt requested assistance to bathroom. Upon returning from bathroom pt desat to 87%, encouraged sitting EOB. [address] on & encouraged pursed lip breaths allowing O2 to come back up to 94%. Pt c/o feeling SOB after ambulating ~41ft. No complaints during session. Pt to bed after session with all needs met.    [facility]

## 2023-10-09 NOTE — Assessment & Plan Note (Signed)
-   No signs/symptoms of exacerbation.  No volume overload on CXR - BNP at baseline values - Continue torsemide

## 2023-10-09 NOTE — Assessment & Plan Note (Signed)
-  Continue Coreg and Eliquis

## 2023-10-09 NOTE — Assessment & Plan Note (Signed)
-  Continue amlodipine and Coreg

## 2023-10-09 NOTE — Progress Notes (Signed)
Progress Note    [name]   ZOX:[id number]  DOB: 1931/12/12  DOA: 10/08/2023     1 PCP: Pincus Sanes, MD  Initial CC: SOB  Hospital Course: [name] is an 89 yo female with PMH COPD, nocturnal O2 2L, CAD, DMII who presented with worsening dyspnea and some associated chest tightness.  She is fairly active and thinks she also may have overdone it recently with some manual labor resulting in pain in her upper back and front chest wall. Due to the chest tightness and difficulty breathing, she presented to the ER. Troponin trend was negative and EKG was negative for signs of ischemia. CXR showed bibasilar opacities. COVID test was negative. She was started on steroids, breathing treatments, and azithro.  Interval History:  Doing okay this morning but states she still feels "bad".  Biggest complaint is some chest tightness still which she had overnight.  Did not feel comfortable going home at this time yet.  We are tentatively planning for tomorrow.  Assessment and Plan: * COPD with acute exacerbation (HCC) - suspected COPD exac; u/k etiology  - check RVP; negative for covid - continue breathing treatments, steroids, azithromycin  Nocturnal hypoxemia - Usually on 2 L oxygen at night - Currently on 4 L - Perform walk test and wean as able  DMII (diabetes mellitus, type 2) (HCC) - Continue SSI and CBG monitoring - If elevates excessively with steroids, will adjust regimen  Chronic diastolic heart failure (HCC) - No signs/symptoms of exacerbation.  No volume overload on CXR - BNP at baseline values - Continue torsemide  ATRIAL FIBRILLATION - Continue Coreg and Eliquis  HTN (hypertension) - Continue amlodipine and Coreg   Old records reviewed in assessment of this patient  Antimicrobials: Azithromycin 10/08/2023 >> current  DVT prophylaxis:  SCDs Start: 10/08/23 1455 apixaban (ELIQUIS) tablet 5 mg   Code Status:   Code Status: Limited: Do not attempt  resuscitation (DNR) -DNR-LIMITED -Do Not Intubate/DNI   Mobility Assessment (Last 72 Hours)     Mobility Assessment     Row Name 10/09/23 0813 10/08/23 2058 10/08/23 1518       Does patient have an order for bedrest or is patient medically unstable No - Continue assessment No - Continue assessment No - Continue assessment     What is the highest level of mobility based on the progressive mobility assessment? Level 5 (Walks with assist in room/hall) - Balance while stepping forward/back and can walk in room with assist - Complete Level 5 (Walks with assist in room/hall) - Balance while stepping forward/back and can walk in room with assist - Complete Level 4 (Walks with assist in room) - Balance while marching in place and cannot step forward and back - Complete              Barriers to discharge: none Disposition Plan:  Home Status is: Inpt  Objective: Blood pressure (!) 143/46, pulse 73, temperature 98.6 F (37 C), temperature source Oral, resp. rate 20, height 5\' 3"  (1.6 m), weight 70.6 kg, SpO2 96%.  Examination:  Physical Exam Constitutional:      Appearance: Normal appearance.  HENT:     Head: Normocephalic and atraumatic.     Mouth/Throat:     Mouth: Mucous membranes are moist.  Eyes:     Extraocular Movements: Extraocular movements intact.  Cardiovascular:     Rate and Rhythm: Normal rate and regular rhythm.  Pulmonary:     Effort: Pulmonary effort is normal.  No respiratory distress.     Breath sounds: No wheezing or rhonchi.  Abdominal:     General: Bowel sounds are normal. There is no distension.     Palpations: Abdomen is soft.     Tenderness: There is no abdominal tenderness.  Musculoskeletal:        General: Normal range of motion.     Cervical back: Normal range of motion and neck supple.  Skin:    General: Skin is warm and dry.  Neurological:     General: No focal deficit present.     Mental Status: She is alert.  Psychiatric:        Mood and Affect:  Mood normal.      Consultants:    Procedures:    Data Reviewed: Results for orders placed or performed during the hospital encounter of 10/08/23 (from the past 24 hour(s))  Troponin I (High Sensitivity)     Status: None   Collection Time: 10/08/23 11:55 AM  Result Value Ref Range   Troponin I (High Sensitivity) 13 <18 ng/L  Hemoglobin A1c     Status: Abnormal   Collection Time: 10/08/23  3:18 PM  Result Value Ref Range   Hgb A1c MFr Bld 7.5 (H) 4.8 - 5.6 %   Mean Plasma Glucose 168.55 mg/dL  Glucose, capillary     Status: Abnormal   Collection Time: 10/08/23  4:29 PM  Result Value Ref Range   Glucose-Capillary 279 (H) 70 - 99 mg/dL  Glucose, capillary     Status: Abnormal   Collection Time: 10/08/23  8:25 PM  Result Value Ref Range   Glucose-Capillary 235 (H) 70 - 99 mg/dL  Glucose, capillary     Status: Abnormal   Collection Time: 10/09/23 12:17 AM  Result Value Ref Range   Glucose-Capillary 347 (H) 70 - 99 mg/dL  Glucose, capillary     Status: Abnormal   Collection Time: 10/09/23  7:25 AM  Result Value Ref Range   Glucose-Capillary 301 (H) 70 - 99 mg/dL   *Note: Due to a large number of results and/or encounters for the requested time period, some results have not been displayed. A complete set of results can be found in Results Review.    I have reviewed pertinent nursing notes, vitals, labs, and images as necessary. I have ordered labwork to follow up on as indicated.  I have reviewed the last notes from staff over past 24 hours. I have discussed patient's care plan and test results with nursing staff, CM/SW, and other staff as appropriate.  Time spent: Greater than 50% of the 55 minute visit was spent in counseling/coordination of care for the patient as laid out in the A&P.   LOS: 1 day   Lewie Chamber, MD [facility] 10/09/2023, 11:11 AM

## 2023-10-09 NOTE — Hospital Course (Addendum)
[name] is an 89 yo female with PMH COPD, nocturnal O2 2L, CAD, DMII who presented with worsening dyspnea and some associated chest tightness.  She is fairly active and thinks she also may have overdone it recently with some manual labor resulting in pain in her upper back and front chest wall. Due to the chest tightness and difficulty breathing, she presented to the ER. Troponin trend was negative and EKG was negative for signs of ischemia. CXR showed bibasilar opacities. COVID test was negative. She was started on steroids, breathing treatments, and azithro. She had good clinical improvement and was having no further wheezing at time of discharge.  Respiratory status had returned back to her normal baseline.  In setting of elevated CBGs from steroid use during hospitalization, she was not continued on any further steroids at discharge. She did request a nebulizer machine which was ordered with case management help prior to discharge and nebulizer prescription sent.

## 2023-10-10 DIAGNOSIS — J441 Chronic obstructive pulmonary disease with (acute) exacerbation: Secondary | ICD-10-CM | POA: Diagnosis not present

## 2023-10-10 DIAGNOSIS — G4734 Idiopathic sleep related nonobstructive alveolar hypoventilation: Secondary | ICD-10-CM | POA: Diagnosis not present

## 2023-10-10 DIAGNOSIS — R0902 Hypoxemia: Secondary | ICD-10-CM

## 2023-10-10 LAB — GLUCOSE, CAPILLARY
Glucose-Capillary: 277 mg/dL — ABNORMAL HIGH (ref 70–99)
Glucose-Capillary: 411 mg/dL — ABNORMAL HIGH (ref 70–99)
Glucose-Capillary: 375 mg/dL — ABNORMAL HIGH (ref 70–99)
Glucose-Capillary: 237 mg/dL — ABNORMAL HIGH (ref 70–99)
Glucose-Capillary: 268 mg/dL — ABNORMAL HIGH (ref 70–99)

## 2023-10-10 MED ORDER — INSULIN GLARGINE-YFGN 100 UNIT/ML ~~LOC~~ SOLN
12.0000 [IU] | SUBCUTANEOUS | Status: AC
  Administered 2023-10-10: 12 [IU] via SUBCUTANEOUS
  Filled 2023-10-10: qty 0.12

## 2023-10-10 MED ORDER — BISACODYL 10 MG RE SUPP
10.0000 mg | Freq: Once | RECTAL | Status: AC
  Administered 2023-10-10: 10 mg via RECTAL
  Filled 2023-10-10: qty 1

## 2023-10-10 MED ORDER — PREDNISONE 20 MG PO TABS
40.0000 mg | ORAL_TABLET | Freq: Every day | ORAL | Status: DC
  Administered 2023-10-11: 40 mg via ORAL
  Filled 2023-10-10: qty 2

## 2023-10-10 MED ORDER — IPRATROPIUM-ALBUTEROL 0.5-2.5 (3) MG/3ML IN SOLN: 3.0000 mL | Freq: Four times a day (QID) | RESPIRATORY_TRACT | 1 refills | Status: AC | PRN

## 2023-10-10 MED ORDER — LORATADINE 10 MG PO TABS
10.0000 mg | ORAL_TABLET | Freq: Every day | ORAL | Status: DC
  Administered 2023-10-10 – 2023-10-11 (×2): 10 mg via ORAL
  Filled 2023-10-10 (×2): qty 1

## 2023-10-10 MED ORDER — INSULIN ASPART 100 UNIT/ML IJ SOLN
20.0000 [IU] | INTRAMUSCULAR | Status: AC
  Administered 2023-10-10: 20 [IU] via SUBCUTANEOUS

## 2023-10-10 MED ORDER — IPRATROPIUM-ALBUTEROL 0.5-2.5 (3) MG/3ML IN SOLN
3.0000 mL | Freq: Three times a day (TID) | RESPIRATORY_TRACT | Status: DC
  Administered 2023-10-10 – 2023-10-11 (×3): 3 mL via RESPIRATORY_TRACT
  Filled 2023-10-10 (×4): qty 3

## 2023-10-10 NOTE — Progress Notes (Signed)
Mobility Specialist - Progress Note   10/10/23 1240  Mobility  Activity Ambulated with assistance to bathroom  Level of Assistance Modified independent, requires aide device or extra time  Assistive Device Other (Comment) (IV Pole)  Distance Ambulated (ft) 20 ft  Activity Response Tolerated well  Mobility Referral Yes  $Mobility charge 1 Mobility  Mobility Specialist Start Time (ACUTE ONLY) 1220  Mobility Specialist Stop Time (ACUTE ONLY) 1240  Mobility Specialist Time Calculation (min) (ACUTE ONLY) 20 min   Pt received in bed requesting assistance to the bathroom. No complaints during session. Pt to recliner after session with all needs met & NT in room.   [facility]

## 2023-10-10 NOTE — Progress Notes (Signed)
SATURATION QUALIFICATIONS: (This note is used to comply with regulatory documentation for home oxygen)  Patient Saturations on Room Air at Rest = 95%  Patient Saturations on Room Air while Ambulating = 82%  Patient Saturations on 4 Liters of oxygen while Ambulating = 94%  Please briefly explain why patient needs home oxygen:Patient desats with ambulation.

## 2023-10-10 NOTE — Plan of Care (Signed)
  Problem: Education: Goal: Knowledge of General Education information will improve Description: Including pain rating scale, medication(s)/side effects and non-pharmacologic comfort measures Outcome: Progressing   Problem: Clinical Measurements: Goal: Ability to maintain clinical measurements within normal limits will improve Outcome: Progressing   

## 2023-10-10 NOTE — Progress Notes (Signed)
Patients CBG 411, MD notified and new orders placed.

## 2023-10-10 NOTE — Inpatient Diabetes Management (Signed)
Inpatient Diabetes Program Recommendations  AACE/ADA: New Consensus Statement on Inpatient Glycemic Control (2015)  Target Ranges:  Prepandial:   less than 140 mg/dL      Peak postprandial:   less than 180 mg/dL (1-2 hours)      Critically ill patients:  140 - 180 mg/dL   Lab Results  Component Value Date   GLUCAP 277 (H) 10/10/2023   HGBA1C 7.5 (H) 10/08/2023    Review of Glycemic Control  Latest Reference Range & Units 10/09/23 07:25 10/09/23 11:45 10/09/23 16:31 10/09/23 22:19 10/10/23 07:39  Glucose-Capillary 70 - 99 mg/dL 161 (H) 096 (H) 045 (H) 338 (H) 277 (H)   Diabetes history: DM  Outpatient Diabetes medications:  Novolog 4-6 units tid with meals (Stopped taking basal insulin in July, 2024- See's Dr. Elvera Lennox)  Current orders for Inpatient glycemic control:  Novolog 0-15 units tid with meals and HS Solumedrol 40 mg IV q 12 hours  Inpatient Diabetes Program Recommendations:    Note that blood sugars are elevated.  Patient was taking insulin prior to admit (but only rapid acting with meals).  While on steroids and in the hospital, consider adding Semglee 12 units daily.  Also consider reducing Novolog correction to sensitive tid with meals and add Novolog 3 units tid with meals (hold if patient eats less than 50% or NPO).    Thanks,  Beryl Meager, RN, BC-ADM Inpatient Diabetes Coordinator Pager [phone]  (8a-5p)

## 2023-10-10 NOTE — TOC Initial Note (Signed)
Transition of Care [facility]) - Initial/Assessment Note    Patient Details  Name: [name] MRN: [id number] Date of Birth: [birth year]  Transition of Care [facility]) CM/SW Contact:    Amada Jupiter, LCSW Phone Number: 10/10/2023, 3:15 PM  Clinical Narrative:                  Met with pt today to introduce CSW role with dc planning and to screen for possible dc needs.  Pt very pleasant and engaged.  Notes she is living in an independent apt at [place] x 4 yrs and feels very comfortable with plan to return.  She has local family who check on her.  Confirms she has home O2 supplied by [employer] and has HH services available if needed via [facility].    Discussed that MD has placed orders for home nebulizer and she is aware/ agreeable.  Have reached out to [employer], however, they do not supply nebulizers.  Pt does not have another agency preference.  Order placed with [facility] who will provide both the nebulizer machine and meds.  MD aware.  Anticipating discharge tomorrow.   Expected Discharge Plan: Home/Self Care ([place] apt at [facility]) Barriers to Discharge: No Barriers Identified   Patient Goals and CMS Choice Patient states their goals for this hospitalization and ongoing recovery are:: return to apt CMS Medicare.gov Compare Post Acute Care list provided to:: Patient Choice offered to / list presented to : Patient      Expected Discharge Plan and Services     Post Acute Care Choice: Durable Medical Equipment, Home Health Living arrangements for the past 2 months: Independent Living Facility                 DME Arranged: Nebulizer/meds DME Agency: Patsy Lager Date DME Agency Contacted: 10/10/23 Time DME Agency Contacted: [phone] Representative spoke with at DME Agency: Morrie Sheldon            Prior Living Arrangements/Services Living arrangements for the past 2 months: Independent Living Facility Lives with:: Self Patient language and need for interpreter reviewed:: Yes Do you  feel safe going back to the place where you live?: Yes      Need for Family Participation in Patient Care: No (Comment) Care giver support system in place?: Yes (comment)   Criminal Activity/Legal Involvement Pertinent to Current Situation/Hospitalization: No - Comment as needed  Activities of Daily Living   ADL Screening (condition at time of admission) Independently performs ADLs?: Yes (appropriate for developmental age) Is the patient deaf or have difficulty hearing?: No Does the patient have difficulty seeing, even when wearing glasses/contacts?: No Does the patient have difficulty concentrating, remembering, or making decisions?: No  Permission Sought/Granted Permission sought to share information with : Family Supports Permission granted to share information with : Yes, Verbal Permission Granted  Share Information with NAME: daughter, Pricie Mclerran @ [phone]           Emotional Assessment Appearance:: Appears stated age Attitude/Demeanor/Rapport: Engaged, Gracious Affect (typically observed): Accepting Orientation: : Oriented to Self, Oriented to Place, Oriented to  Time, Oriented to Situation Alcohol / Substance Use: Not Applicable Psych Involvement: No (comment)  Admission diagnosis:  COPD with acute exacerbation (HCC) [J44.1] Community acquired pneumonia, unspecified laterality [J18.9] Patient Active Problem List   Diagnosis Date Noted   Hypoxia 10/10/2023   COPD with acute exacerbation (HCC) 10/08/2023   Lower abdominal pain 08/30/2023   Chronic combined systolic and diastolic heart failure (HCC) 05/08/2023   Seborrheic keratoses  01/18/2023   Constipation 01/18/2023   URI (upper respiratory infection) 01/18/2023   Post-viral cough syndrome 01/17/2023   Palliative care encounter 01/17/2023   Bilateral sensorineural hearing loss 01/12/2022   Chronic respiratory failure with hypoxia (HCC) 05/12/2021   Poorly controlled type 2 diabetes mellitus with circulatory  disorder (HCC) 01/31/2021   Bilateral impacted cerumen 11/25/2020   Neuropathy 08/19/2020   PAD (peripheral artery disease) (HCC) 02/29/2020   Chronic kidney disease (CKD) 02/20/2020   Osteoarthritis 08/01/2019   Arthritis of left sacroiliac joint (HCC) 05/04/2017   Osteopenia 02/17/2017   Piriformis syndrome of left side 02/16/2017   Acquired leg length discrepancy 02/16/2017   COPD GOLD I  01/27/2017   Lumbar radiculopathy, acute 01/27/2017   Granuloma annulare 07/27/2016   Numbness of fingers 07/27/2016   Primary osteoarthritis of right knee 06/27/2016   Rash and nonspecific skin eruption 06/09/2016   DMII (diabetes mellitus, type 2) (HCC) 03/07/2016   Acute on chronic combined systolic (congestive) and diastolic (congestive) heart failure (HCC)    OSA  08/28/2014   Chronic diastolic heart failure (HCC) 07/30/2014   Nocturnal hypoxemia 07/09/2014   SOB (shortness of breath) 05/15/2014   Pulmonary HTN (HCC) 05/15/2014   Band keratopathy 01/16/2014   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   Multinodular goiter 10/14/2011   Atrophy, Fuchs' 09/29/2011   ATRIAL FIBRILLATION 06/03/2010   VITILIGO 11/07/2009   SINOATRIAL NODE DYSFUNCTION 06/16/2009   Atrial flutter (HCC) 06/25/2008   HYPERLIPIDEMIA 01/29/2008   Coronary atherosclerosis 01/29/2008   HTN (hypertension) 10/30/2007   PCP:  Pincus Sanes, MD Pharmacy:   [facility]) - [place], [place] - [address] [facility] [address] [place] [address] [place] [address] Phone: [phone] Fax: [phone]  [facility] [address], [place] - [address] [place] [address] Phone: [phone] Fax: [phone]  [facility] - [place], [place] - [address] [place] [address] Phone: [phone] Fax: [phone]  [facility] - [place], [place] - [address] [facility] [address]  [employer] [address] [place] [address] Phone: [phone] Fax: [phone]     Social Determinants of Health (SDOH) Social History: SDOH Screenings   Food Insecurity: No Food Insecurity (10/08/2023)  Housing: Low Risk  (10/08/2023)  Transportation Needs: No Transportation Needs (10/08/2023)  Utilities: Not At Risk (10/08/2023)  Alcohol Screen: Low Risk  (09/08/2023)  Depression (PHQ2-9): Low Risk  (09/08/2023)  Financial Resource Strain: Low Risk  (09/08/2023)  Physical Activity: Sufficiently Active (09/08/2023)  Social Connections: Socially Integrated (09/08/2023)  Stress: No Stress Concern Present (09/08/2023)  Tobacco Use: Medium Risk (10/08/2023)  Health Literacy: Adequate Health Literacy (09/08/2023)   SDOH Interventions:     Readmission Risk Interventions    10/10/2023    3:10 PM  Readmission Risk Prevention Plan  Post Dischage Appt Complete  Medication Screening Complete  Transportation Screening Complete

## 2023-10-10 NOTE — Progress Notes (Signed)
Progress Note    [name]   ZOX:[id number]  DOB: 1931-04-04  DOA: 10/08/2023     2 PCP: [name] Sanes, MD  Initial CC: SOB  Hospital Course: [name] is an 89 yo female with PMH COPD, nocturnal O2 2L, CAD, DMII who presented with worsening dyspnea and some associated chest tightness.  She is fairly active and thinks she also may have overdone it recently with some manual labor resulting in pain in her upper back and front chest wall. Due to the chest tightness and difficulty breathing, she presented to the ER. Troponin trend was negative and EKG was negative for signs of ischemia. CXR showed bibasilar opacities. COVID test was negative. She was started on steroids, breathing treatments, and azithro.  Interval History:  No events overnight.  Complaining of some congestion today. CBG was also further elevated today due to steroid use.  Insulin regimen adjusted. Still having some desaturations when ambulating on room air.  Assessment and Plan: * COPD with acute exacerbation (HCC) - suspected COPD exac; u/k etiology  - check RVP; negative for covid - continue breathing treatments, steroids, azithromycin  Nocturnal hypoxemia - Usually on 2 L oxygen at night - Currently on daytime oxygen - Still having desaturations with walk test as well.  Down to 82% on room air today - repeat tomorrow; may end up needing daytime oxygen until further improvement which she has been told  DMII (diabetes mellitus, type 2) (HCC) - Last A1c 7.5% on 10/08/2023 - Continue SSI and CBG monitoring - If elevates excessively with steroids, will adjust regimen  Chronic diastolic heart failure (HCC) - No signs/symptoms of exacerbation.  No volume overload on CXR - BNP at baseline values - Continue torsemide  ATRIAL FIBRILLATION - Continue Coreg and Eliquis  HTN (hypertension) - Continue amlodipine and Coreg   Old records reviewed in assessment of this  patient  Antimicrobials: Azithromycin 10/08/2023 >> current  DVT prophylaxis:  SCDs Start: 10/08/23 1455 apixaban (ELIQUIS) tablet 5 mg   Code Status:   Code Status: Limited: Do not attempt resuscitation (DNR) -DNR-LIMITED -Do Not Intubate/DNI   Mobility Assessment (Last 72 Hours)     Mobility Assessment     Row Name 10/10/23 0800 10/09/23 2008 10/09/23 0813 10/08/23 2058 10/08/23 1518   Does patient have an order for bedrest or is patient medically unstable No - Continue assessment No - Continue assessment No - Continue assessment No - Continue assessment No - Continue assessment   What is the highest level of mobility based on the progressive mobility assessment? Level 5 (Walks with assist in room/hall) - Balance while stepping forward/back and can walk in room with assist - Complete Level 5 (Walks with assist in room/hall) - Balance while stepping forward/back and can walk in room with assist - Complete Level 5 (Walks with assist in room/hall) - Balance while stepping forward/back and can walk in room with assist - Complete Level 5 (Walks with assist in room/hall) - Balance while stepping forward/back and can walk in room with assist - Complete Level 4 (Walks with assist in room) - Balance while marching in place and cannot step forward and back - Complete            Barriers to discharge: none Disposition Plan:  Home Status is: Inpt  Objective: Blood pressure (!) 156/55, pulse 65, temperature 97.8 F (36.6 C), temperature source Oral, resp. rate 19, height 5\' 3"  (1.6 m), weight 70.6 kg, SpO2 96%.  Examination:  Physical  Exam Constitutional:      Appearance: Normal appearance.  HENT:     Head: Normocephalic and atraumatic.     Mouth/Throat:     Mouth: Mucous membranes are moist.  Eyes:     Extraocular Movements: Extraocular movements intact.  Cardiovascular:     Rate and Rhythm: Normal rate and regular rhythm.  Pulmonary:     Effort: Pulmonary effort is normal. No  respiratory distress.     Breath sounds: No wheezing or rhonchi.  Abdominal:     General: Bowel sounds are normal. There is no distension.     Palpations: Abdomen is soft.     Tenderness: There is no abdominal tenderness.  Musculoskeletal:        General: Normal range of motion.     Cervical back: Normal range of motion and neck supple.  Skin:    General: Skin is warm and dry.  Neurological:     General: No focal deficit present.     Mental Status: She is alert.  Psychiatric:        Mood and Affect: Mood normal.      Consultants:    Procedures:    Data Reviewed: Results for orders placed or performed during the hospital encounter of 10/08/23 (from the past 24 hour(s))  Glucose, capillary     Status: Abnormal   Collection Time: 10/09/23  4:31 PM  Result Value Ref Range   Glucose-Capillary 223 (H) 70 - 99 mg/dL  Glucose, capillary     Status: Abnormal   Collection Time: 10/09/23 10:19 PM  Result Value Ref Range   Glucose-Capillary 338 (H) 70 - 99 mg/dL  Glucose, capillary     Status: Abnormal   Collection Time: 10/10/23  7:39 AM  Result Value Ref Range   Glucose-Capillary 277 (H) 70 - 99 mg/dL  Glucose, capillary     Status: Abnormal   Collection Time: 10/10/23 11:31 AM  Result Value Ref Range   Glucose-Capillary 411 (H) 70 - 99 mg/dL  Glucose, capillary     Status: Abnormal   Collection Time: 10/10/23 12:41 PM  Result Value Ref Range   Glucose-Capillary 375 (H) 70 - 99 mg/dL   *Note: Due to a large number of results and/or encounters for the requested time period, some results have not been displayed. A complete set of results can be found in Results Review.    I have reviewed pertinent nursing notes, vitals, labs, and images as necessary. I have ordered labwork to follow up on as indicated.  I have reviewed the last notes from staff over past 24 hours. I have discussed patient's care plan and test results with nursing staff, CM/SW, and other staff as  appropriate.  Time spent: Greater than 50% of the 55 minute visit was spent in counseling/coordination of care for the patient as laid out in the A&P.   LOS: 2 days   Lewie Chamber, MD [facility] 10/10/2023, 2:38 PM

## 2023-10-11 ENCOUNTER — Encounter (HOSPITAL_COMMUNITY): Payer: Self-pay | Admitting: Internal Medicine

## 2023-10-11 ENCOUNTER — Encounter: Payer: Self-pay | Admitting: Internal Medicine

## 2023-10-11 DIAGNOSIS — G4734 Idiopathic sleep related nonobstructive alveolar hypoventilation: Secondary | ICD-10-CM | POA: Diagnosis not present

## 2023-10-11 DIAGNOSIS — J441 Chronic obstructive pulmonary disease with (acute) exacerbation: Secondary | ICD-10-CM | POA: Diagnosis not present

## 2023-10-11 DIAGNOSIS — R0902 Hypoxemia: Secondary | ICD-10-CM | POA: Diagnosis not present

## 2023-10-11 LAB — CBC
HCT: 41.2 % (ref 36.0–46.0)
Hemoglobin: 12.9 g/dL (ref 12.0–15.0)
MCH: 29.7 pg (ref 26.0–34.0)
MCHC: 31.3 g/dL (ref 30.0–36.0)
MCV: 94.9 fL (ref 80.0–100.0)
Platelets: 164 10*3/uL (ref 150–400)
RBC: 4.34 MIL/uL (ref 3.87–5.11)
RDW: 14.2 % (ref 11.5–15.5)
WBC: 16 10*3/uL — ABNORMAL HIGH (ref 4.0–10.5)
nRBC: 0 % (ref 0.0–0.2)

## 2023-10-11 LAB — GLUCOSE, CAPILLARY
Glucose-Capillary: 226 mg/dL — ABNORMAL HIGH (ref 70–99)
Glucose-Capillary: 259 mg/dL — ABNORMAL HIGH (ref 70–99)

## 2023-10-11 NOTE — Progress Notes (Signed)
NP Garner Nash was notified of patient's BP.

## 2023-10-11 NOTE — Discharge Summary (Signed)
Physician Discharge Summary   [name] YSA:[id number] DOB: [birth year] DOA: 10/08/2023  PCP: Pincus Sanes, MD  Admit date: 10/08/2023 Discharge date:  10/11/2023  Admitted From: Home Disposition:  Home Discharging physician: Lewie Chamber, MD Barriers to discharge: none  Recommendations at discharge: Continue chronic medical management   Equipment/Devices: Neb machine  Discharge Condition: stable CODE STATUS: DNR Diet recommendation:  Diet Orders (From admission, onward)     Start     Ordered   10/11/23 0000  Diet Carb Modified        10/11/23 1012   10/08/23 1444  Diet Carb Modified Fluid consistency: Thin  Diet effective now       Question Answer Comment  Calorie Level Medium 1600-2000   Fluid consistency: Thin      10/08/23 1443            Hospital Course: Ms. Patyk is an 89 yo female with PMH COPD, nocturnal O2 2L, CAD, DMII who presented with worsening dyspnea and some associated chest tightness.  She is fairly active and thinks she also may have overdone it recently with some manual labor resulting in pain in her upper back and front chest wall. Due to the chest tightness and difficulty breathing, she presented to the ER. Troponin trend was negative and EKG was negative for signs of ischemia. CXR showed bibasilar opacities. COVID test was negative. She was started on steroids, breathing treatments, and azithro. She had good clinical improvement and was having no further wheezing at time of discharge.  Respiratory status had returned back to her normal baseline.  In setting of elevated CBGs from steroid use during hospitalization, she was not continued on any further steroids at discharge. She did request a nebulizer machine which was ordered with case management help prior to discharge and nebulizer prescription sent.  Assessment and Plan: * COPD with acute exacerbation (HCC)-resolved as of 10/11/2023 - suspected COPD exac; u/k etiology   -Negative RVP; negative for covid - s/p breathing treatments, steroids, azithromycin - did not require any further treatment at discharge - neb machine ordered prior to d/c and duonebs sent to pharmacy  Hypoxia - on nocturnal O2 at home; now needing some daytime use, especially with exertion - continue weaning off as able  Nocturnal hypoxemia - Usually on 2 L oxygen at night - Currently on daytime oxygen; should improve slowly - patient instructed to continue daytime O2 as needed and weaning; she has a finger monitor to use at home  DMII (diabetes mellitus, type 2) (HCC) - Last A1c 7.5% on 10/08/2023 -Managed by endocrinology - CBGs elevated with steroid use during hospitalization.  No further steroids continued at discharge - Resume home regimen  Chronic diastolic heart failure (HCC) - No signs/symptoms of exacerbation.  No volume overload on CXR - BNP at baseline values - Continue torsemide  ATRIAL FIBRILLATION - Continue Coreg and Eliquis  HTN (hypertension) - Continue amlodipine and Coreg   The patient's acute and chronic medical conditions were treated accordingly. On day of discharge, patient was felt deemed stable for discharge. Patient/family member advised to call PCP or come back to ER if needed.   Principal Diagnosis: COPD with acute exacerbation [facility])  Discharge Diagnoses: Active Hospital Problems   Diagnosis Date Noted   Hypoxia 10/10/2023    Priority: 2.   Nocturnal hypoxemia 07/09/2014    Priority: 2.   DMII (diabetes mellitus, type 2) (HCC) 03/07/2016    Priority: 3.   Chronic diastolic heart failure (HCC)  07/30/2014   ATRIAL FIBRILLATION 06/03/2010   HTN (hypertension) 10/30/2007    Resolved Hospital Problems   Diagnosis Date Noted Date Resolved   COPD with acute exacerbation (HCC) 10/08/2023 10/11/2023    Priority: 1.     Discharge Instructions     Diet Carb Modified   Complete by: As directed    Increase activity slowly   Complete by: As  directed       Allergies as of 10/11/2023       Reactions   Levaquin [levofloxacin] Shortness Of Breath, Swelling   angioedema   Lobster [shellfish Allergy] Other (See Comments)   angioedema   Penicillins Rash   Has patient had a PCN reaction causing immediate rash, facial/tongue/throat swelling, SOB or lightheadedness with hypotension: Yes Has patient had a PCN reaction causing severe rash involving mucus membranes or skin necrosis: No Has patient had a PCN reaction that required hospitalization: No Has patient had a PCN reaction occurring within the last 10 years: No If all of the above answers are "NO", then may proceed with Cephalosporin use.   Valsartan Other (See Comments)   REACTION: angioedema   Codeine Other (See Comments)   Mental status changes   Lipitor [atorvastatin] Other (See Comments)   weakness   Oxycodone Other (See Comments)   hallucinations   Statins    Made her too weak   Tramadol    Amlodipine Besylate Other (See Comments)   REACTION: tingling in lips & gum edema 7/12: talked to patient, states she is tolerating well   Crestor [rosuvastatin Calcium] Rash   Tramadol Hcl Nausea And Vomiting        Medication List     STOP taking these medications    Azelastine HCl 0.15 % Soln Commonly known as: Astepro   insulin aspart protamine- aspart (70-30) 100 UNIT/ML injection Commonly known as: NOVOLOG MIX 70/30   Lantus SoloStar 100 UNIT/ML Solostar Pen Generic drug: insulin glargine       TAKE these medications    acetaminophen 325 MG tablet Commonly known as: TYLENOL Take 2 tablets (650 mg total) by mouth every 6 (six) hours as needed for mild pain or headache.   amLODipine 5 MG tablet Commonly known as: NORVASC TAKE 1 TABLET BY MOUTH DAILY   BD Pen Needle Nano 2nd Gen 32G X 4 MM Misc Generic drug: Insulin Pen Needle Use 3x a day as instructed   carvedilol 6.25 MG tablet Commonly known as: COREG TAKE 1 TABLET BY MOUTH TWICE  DAILY  WITH MEALS   CENTRUM SILVER ULTRA WOMENS PO Take 1 tablet by mouth daily.   Eliquis 5 MG Tabs tablet Generic drug: apixaban TAKE 1 TABLET BY MOUTH TWICE  DAILY   fluticasone 50 MCG/ACT nasal spray Commonly known as: FLONASE Place 1 spray into both nostrils daily as needed for allergies or rhinitis.   FreeStyle Libre 3 Reader Capitola Use to check glucose continuously.   FreeStyle Libre 3 Sensor Misc Place 1 sensor on the skin every 14 days. Use to check glucose continuously   ipratropium-albuterol 0.5-2.5 (3) MG/3ML Soln Commonly known as: DUONEB Take 3 mLs by nebulization every 6 (six) hours as needed.   Lancing Device Misc Use as advised - for Delica Lancets   nitroGLYCERIN 0.4 MG SL tablet Commonly known as: NITROSTAT Place 1 tablet (0.4 mg total) under the tongue every 5 (five) minutes as needed for chest pain. Reported on 01/28/2016   NovoLOG FlexPen 100 UNIT/ML FlexPen Generic drug: insulin aspart Inject  4-6 Units into the skin 3 (three) times daily before meals.   OneTouch Delica Plus Lancet33G Misc USE TO MONITOR GLUCOSE  LEVELS TWICE DAILY   OneTouch Verio test strip Generic drug: glucose blood CHECK BLOOD SUGAR TWICE DAILY   OneTouch Verio w/Device Kit Use as instructed to check blood sugar   OXYGEN Inhale 3 Doses into the lungs as needed. 3 liters   Ozempic (0.25 or 0.5 MG/DOSE) 2 MG/1.5ML Sopn Generic drug: Semaglutide(0.25 or 0.5MG /DOS) Inject 0.5 mg into the skin once a week.   potassium chloride SA 20 MEQ tablet Commonly known as: KLOR-CON M TAKE 2 TABLETS BY MOUTH DAILY   torsemide 20 MG tablet Commonly known as: DEMADEX TAKE 2 TABLETS BY MOUTH TWICE  DAILY   Vitamin D (Cholecalciferol) 25 MCG (1000 UT) Caps Take 1 capsule by mouth daily.               Durable Medical Equipment  (From admission, onward)           Start     Ordered   10/10/23 1036  For home use only DME Nebulizer/meds  Once       Question Answer Comment  Patient  needs a nebulizer to treat with the following condition COPD (chronic obstructive pulmonary disease) (HCC)   Length of Need Lifetime      10/10/23 1035   10/10/23 1036  For home use only DME Nebulizer machine  Once       Question Answer Comment  Patient needs a nebulizer to treat with the following condition COPD (chronic obstructive pulmonary disease) (HCC)   Length of Need Lifetime      10/10/23 1035            Allergies  Allergen Reactions   Levaquin [Levofloxacin] Shortness Of Breath and Swelling    angioedema   Lobster [Shellfish Allergy] Other (See Comments)    angioedema   Penicillins Rash    Has patient had a PCN reaction causing immediate rash, facial/tongue/throat swelling, SOB or lightheadedness with hypotension: Yes Has patient had a PCN reaction causing severe rash involving mucus membranes or skin necrosis: No Has patient had a PCN reaction that required hospitalization: No Has patient had a PCN reaction occurring within the last 10 years: No If all of the above answers are "NO", then may proceed with Cephalosporin use.   Valsartan Other (See Comments)    REACTION: angioedema   Codeine Other (See Comments)    Mental status changes   Lipitor [Atorvastatin] Other (See Comments)    weakness   Oxycodone Other (See Comments)    hallucinations   Statins     Made her too weak   Tramadol    Amlodipine Besylate Other (See Comments)    REACTION: tingling in lips & gum edema 7/12: talked to patient, states she is tolerating well   Crestor [Rosuvastatin Calcium] Rash   Tramadol Hcl Nausea And Vomiting    Consultations:   Procedures:   Discharge Exam: BP (!) 178/70 (BP Location: Left Arm)   Pulse 71   Temp 97.8 F (36.6 C) (Oral)   Resp 20   Ht 5\' 3"  (1.6 m)   Wt 70.6 kg   SpO2 96%   BMI 27.58 kg/m  Physical Exam Constitutional:      Appearance: Normal appearance.  HENT:     Head: Normocephalic and atraumatic.     Mouth/Throat:     Mouth: Mucous  membranes are moist.  Eyes:  Extraocular Movements: Extraocular movements intact.  Cardiovascular:     Rate and Rhythm: Normal rate and regular rhythm.  Pulmonary:     Effort: Pulmonary effort is normal. No respiratory distress.     Breath sounds: No wheezing or rhonchi.  Abdominal:     General: Bowel sounds are normal. There is no distension.     Palpations: Abdomen is soft.     Tenderness: There is no abdominal tenderness.  Musculoskeletal:        General: Normal range of motion.     Cervical back: Normal range of motion and neck supple.  Skin:    General: Skin is warm and dry.  Neurological:     General: No focal deficit present.     Mental Status: She is alert.  Psychiatric:        Mood and Affect: Mood normal.      The results of significant diagnostics from this hospitalization (including imaging, microbiology, ancillary and laboratory) are listed below for reference.   Microbiology: Recent Results (from the past 240 hour(s))  SARS Coronavirus 2 by RT PCR (hospital order, performed in [facility] hospital lab) *cepheid single result test* Anterior Nasal Swab     Status: None   Collection Time: 10/08/23  9:27 AM   Specimen: Anterior Nasal Swab  Result Value Ref Range Status   SARS Coronavirus 2 by RT PCR NEGATIVE NEGATIVE Final    Comment: (NOTE) SARS-CoV-2 target nucleic acids are NOT DETECTED.  The SARS-CoV-2 RNA is generally detectable in upper and lower respiratory specimens during the acute phase of infection. The lowest concentration of SARS-CoV-2 viral copies this assay can detect is 250 copies / mL. A negative result does not preclude SARS-CoV-2 infection and should not be used as the sole basis for treatment or other patient management decisions.  A negative result may occur with improper specimen collection / handling, submission of specimen other than nasopharyngeal swab, presence of viral mutation(s) within the areas targeted by this assay, and  inadequate number of viral copies (<250 copies / mL). A negative result must be combined with clinical observations, patient history, and epidemiological information.  Fact Sheet for Patients:   RoadLapTop.co.za  Fact Sheet for Healthcare Providers: http://kim-miller.com/  This test is not yet approved or  cleared by the [place] FDA and has been authorized for detection and/or diagnosis of SARS-CoV-2 by FDA under an Emergency Use Authorization (EUA).  This EUA will remain in effect (meaning this test can be used) for the duration of the COVID-19 declaration under Section 564(b)(1) of the Act, 21 U.S.C. section 360bbb-3(b)(1), unless the authorization is terminated or revoked sooner.  Performed at [facility], [address] [place], [place] [address]   Respiratory (~20 pathogens) panel by PCR     Status: None   Collection Time: 10/09/23  8:27 AM   Specimen: Nasopharyngeal Swab; Respiratory  Result Value Ref Range Status   Adenovirus NOT DETECTED NOT DETECTED Final   Coronavirus 229E NOT DETECTED NOT DETECTED Final    Comment: (NOTE) The Coronavirus on the Respiratory Panel, DOES NOT test for the novel  Coronavirus (2019 nCoV)    Coronavirus HKU1 NOT DETECTED NOT DETECTED Final   Coronavirus NL63 NOT DETECTED NOT DETECTED Final   Coronavirus OC43 NOT DETECTED NOT DETECTED Final   Metapneumovirus NOT DETECTED NOT DETECTED Final   Rhinovirus / Enterovirus NOT DETECTED NOT DETECTED Final   Influenza A NOT DETECTED NOT DETECTED Final   Influenza B NOT DETECTED NOT  DETECTED Final   Parainfluenza Virus 1 NOT DETECTED NOT DETECTED Final   Parainfluenza Virus 2 NOT DETECTED NOT DETECTED Final   Parainfluenza Virus 3 NOT DETECTED NOT DETECTED Final   Parainfluenza Virus 4 NOT DETECTED NOT DETECTED Final   Respiratory Syncytial Virus NOT DETECTED NOT DETECTED Final   Bordetella pertussis NOT DETECTED NOT DETECTED  Final   Bordetella Parapertussis NOT DETECTED NOT DETECTED Final   Chlamydophila pneumoniae NOT DETECTED NOT DETECTED Final   Mycoplasma pneumoniae NOT DETECTED NOT DETECTED Final    Comment: Performed at [facility], [address], [place], [place] [address]     Labs: BNP (last 3 results) Recent Labs    10/08/23 0927  BNP 222.7*   Basic Metabolic Panel: Recent Labs  Lab 10/08/23 0927  NA 139  K 4.4  CL 97*  CO2 35*  GLUCOSE 187*  BUN 25*  CREATININE 0.86  CALCIUM 10.4*   Liver Function Tests: Recent Labs  Lab 10/08/23 0927  AST 26  ALT 17  ALKPHOS 73  BILITOT 0.6  PROT 7.9  ALBUMIN 4.3   No results for input(s): "LIPASE", "AMYLASE" in the last 168 hours. No results for input(s): "AMMONIA" in the last 168 hours. CBC: Recent Labs  Lab 10/08/23 0927 10/11/23 0436  WBC 6.4 16.0*  NEUTROABS 4.8  --   HGB 15.2* 12.9  HCT 47.3* 41.2  MCV 93.5 94.9  PLT 168 164   Cardiac Enzymes: No results for input(s): "CKTOTAL", "CKMB", "CKMBINDEX", "TROPONINI" in the last 168 hours. BNP: Invalid input(s): "POCBNP" CBG: Recent Labs  Lab 10/10/23 1241 10/10/23 1505 10/10/23 2129 10/11/23 0743 10/11/23 1144  GLUCAP 375* 237* 268* 226* 259*   D-Dimer No results for input(s): "DDIMER" in the last 72 hours. Hgb A1c Recent Labs    10/08/23 1518  HGBA1C 7.5*   Lipid Profile No results for input(s): "CHOL", "HDL", "LDLCALC", "TRIG", "CHOLHDL", "LDLDIRECT" in the last 72 hours. Thyroid function studies No results for input(s): "TSH", "T4TOTAL", "T3FREE", "THYROIDAB" in the last 72 hours.  Invalid input(s): "FREET3" Anemia work up No results for input(s): "VITAMINB12", "FOLATE", "FERRITIN", "TIBC", "IRON", "RETICCTPCT" in the last 72 hours. Urinalysis    Component Value Date/Time   COLORURINE YELLOW 08/30/2023 0935   APPEARANCEUR Sl Cloudy (A) 08/30/2023 0935   LABSPEC 1.010 08/30/2023 0935   PHURINE 6.0 08/30/2023 0935   GLUCOSEU NEGATIVE 08/30/2023  0935   HGBUR NEGATIVE 08/30/2023 0935   BILIRUBINUR NEGATIVE 08/30/2023 0935   BILIRUBINUR negative 08/26/2016 1137   KETONESUR NEGATIVE 08/30/2023 0935   PROTEINUR 100 (A) 08/29/2016 1632   UROBILINOGEN 0.2 08/30/2023 0935   NITRITE NEGATIVE 08/30/2023 0935   LEUKOCYTESUR MODERATE (A) 08/30/2023 0935   Sepsis Labs Recent Labs  Lab 10/08/23 0927 10/11/23 0436  WBC 6.4 16.0*   Microbiology Recent Results (from the past 240 hour(s))  SARS Coronavirus 2 by RT PCR (hospital order, performed in [facility] hospital lab) *cepheid single result test* Anterior Nasal Swab     Status: None   Collection Time: 10/08/23  9:27 AM   Specimen: Anterior Nasal Swab  Result Value Ref Range Status   SARS Coronavirus 2 by RT PCR NEGATIVE NEGATIVE Final    Comment: (NOTE) SARS-CoV-2 target nucleic acids are NOT DETECTED.  The SARS-CoV-2 RNA is generally detectable in upper and lower respiratory specimens during the acute phase of infection. The lowest concentration of SARS-CoV-2 viral copies this assay can detect is 250 copies / mL. A negative result does not preclude  SARS-CoV-2 infection and should not be used as the sole basis for treatment or other patient management decisions.  A negative result may occur with improper specimen collection / handling, submission of specimen other than nasopharyngeal swab, presence of viral mutation(s) within the areas targeted by this assay, and inadequate number of viral copies (<250 copies / mL). A negative result must be combined with clinical observations, patient history, and epidemiological information.  Fact Sheet for Patients:   RoadLapTop.co.za  Fact Sheet for Healthcare Providers: http://kim-miller.com/  This test is not yet approved or  cleared by the [place] FDA and has been authorized for detection and/or diagnosis of SARS-CoV-2 by FDA under an Emergency Use Authorization (EUA).  This EUA will  remain in effect (meaning this test can be used) for the duration of the COVID-19 declaration under Section 564(b)(1) of the Act, 21 U.S.C. section 360bbb-3(b)(1), unless the authorization is terminated or revoked sooner.  Performed at [facility], [address], [place], [place] [address]   Respiratory (~20 pathogens) panel by PCR     Status: None   Collection Time: 10/09/23  8:27 AM   Specimen: Nasopharyngeal Swab; Respiratory  Result Value Ref Range Status   Adenovirus NOT DETECTED NOT DETECTED Final   Coronavirus 229E NOT DETECTED NOT DETECTED Final    Comment: (NOTE) The Coronavirus on the Respiratory Panel, DOES NOT test for the novel  Coronavirus (2019 nCoV)    Coronavirus HKU1 NOT DETECTED NOT DETECTED Final   Coronavirus NL63 NOT DETECTED NOT DETECTED Final   Coronavirus OC43 NOT DETECTED NOT DETECTED Final   Metapneumovirus NOT DETECTED NOT DETECTED Final   Rhinovirus / Enterovirus NOT DETECTED NOT DETECTED Final   Influenza A NOT DETECTED NOT DETECTED Final   Influenza B NOT DETECTED NOT DETECTED Final   Parainfluenza Virus 1 NOT DETECTED NOT DETECTED Final   Parainfluenza Virus 2 NOT DETECTED NOT DETECTED Final   Parainfluenza Virus 3 NOT DETECTED NOT DETECTED Final   Parainfluenza Virus 4 NOT DETECTED NOT DETECTED Final   Respiratory Syncytial Virus NOT DETECTED NOT DETECTED Final   Bordetella pertussis NOT DETECTED NOT DETECTED Final   Bordetella Parapertussis NOT DETECTED NOT DETECTED Final   Chlamydophila pneumoniae NOT DETECTED NOT DETECTED Final   Mycoplasma pneumoniae NOT DETECTED NOT DETECTED Final    Comment: Performed at [facility], [address] [place], [place] [address]    Procedures/Studies: DG Chest Port 1 View  Result Date: 10/08/2023 CLINICAL DATA:  89 year old female with history of shortness of breath. EXAM: PORTABLE CHEST 1 VIEW COMPARISON:  Chest x-ray 01/18/2023. FINDINGS: Lung volumes are is increased with  emphysematous changes. Interstitial prominence and patchy ill-defined opacities in the lower lobes of the lungs bilaterally (left-greater-than-right). No pleural effusions. No pneumothorax. No evidence of pulmonary edema. Heart size is mildly enlarged. Upper mediastinal contours are within normal limits. IMPRESSION: 1. Bibasilar opacities concerning for potential pneumonia or aspiration pneumonitis (left-greater-than-right). 2. Aortic atherosclerosis. 3. Mild cardiomegaly. 4. Emphysema. Electronically Signed   By: Trudie Reed M.D.   On: 10/08/2023 11:11     Time coordinating discharge: Over 30 minutes    Lewie Chamber, MD  [facility] 10/11/2023, 12:40 PM

## 2023-10-11 NOTE — Progress Notes (Signed)
Mobility Specialist - Progress Note   10/11/23 0935  Mobility  Activity Ambulated with assistance in hallway;Ambulated with assistance to bathroom  Level of Assistance Standby assist, set-up cues, supervision of patient - no hands on  Assistive Device Front wheel walker  Distance Ambulated (ft) 500 ft  Activity Response Tolerated well  Mobility Referral Yes  $Mobility charge 1 Mobility  Mobility Specialist Start Time (ACUTE ONLY) 0901  Mobility Specialist Stop Time (ACUTE ONLY) [id number]  Mobility Specialist Time Calculation (min) (ACUTE ONLY) 26 min   Pt received in recliner and agreeable to mobility. Pt took x2 standing rest breaks due to SOB. Pt to bathroom after session with all needs met. Instructed pt to pull call bell when finished.   [facility]

## 2023-10-11 NOTE — Plan of Care (Signed)
Problem: Education: Goal: Knowledge of General Education information will improve Description: Including pain rating scale, medication(s)/side effects and non-pharmacologic comfort measures Outcome: Progressing   Problem: Clinical Measurements: Goal: Will remain free from infection Outcome: Progressing

## 2023-10-11 NOTE — Assessment & Plan Note (Signed)
-   on nocturnal O2 at home; now needing some daytime use, especially with exertion - continue weaning off as able

## 2023-10-11 NOTE — TOC Transition Note (Signed)
Transition of Care [facility]) - CM/SW Discharge Note   Patient Details  Name: [name] MRN: [id number] Date of Birth: 1931-10-23  Transition of Care [facility]) CM/SW Contact:  Amada Jupiter, LCSW Phone Number: 10/11/2023, 1:50 PM   Clinical Narrative:     Pt medically cleared for dc today back to [place] apt at [facility].  Confirming she has received nebulizer machine for home use from [facility].  Orders placed for HHPT and pt would like to use [facility] which provides therapy services for [employer] - order sent to liaison today.  No further TOC needs.   Final next level of care: Home w Home Health Services Barriers to Discharge: No Barriers Identified   Patient Goals and CMS Choice CMS Medicare.gov Compare Post Acute Care list provided to:: Patient Choice offered to / list presented to : Patient  Discharge Placement                         Discharge Plan and Services Additional resources added to the After Visit Summary for       Post Acute Care Choice: Durable Medical Equipment, Home Health          DME Arranged: Nebulizer/meds DME Agency: Patsy Lager Date DME Agency Contacted: 10/10/23 Time DME Agency Contacted: [phone] Representative spoke with at DME Agency: Morrie Sheldon HH Arranged: PT HH Agency: Other - See comment Transport planner to see at [employer])     Representative spoke with at [facility] Agency: Rene Kocher  Social Determinants of Health (SDOH) Interventions SDOH Screenings   Food Insecurity: No Food Insecurity (10/08/2023)  Housing: Low Risk  (10/08/2023)  Transportation Needs: No Transportation Needs (10/08/2023)  Utilities: Not At Risk (10/08/2023)  Alcohol Screen: Low Risk  (09/08/2023)  Depression (PHQ2-9): Low Risk  (09/08/2023)  Financial Resource Strain: Low Risk  (09/08/2023)  Physical Activity: Sufficiently Active (09/08/2023)  Social Connections: Socially Integrated (09/08/2023)  Stress: No Stress Concern Present (09/08/2023)  Tobacco  Use: Medium Risk (10/08/2023)  Health Literacy: Adequate Health Literacy (09/08/2023)     Readmission Risk Interventions    10/10/2023    3:10 PM  Readmission Risk Prevention Plan  Post Dischage Appt Complete  Medication Screening Complete  Transportation Screening Complete

## 2023-10-11 NOTE — Progress Notes (Signed)
Reviewed written discharge instructions with patient. All questions answered. Patient verbalized understanding. Discharged via wheelchair with all belongings... in stable condition. 

## 2023-10-13 ENCOUNTER — Telehealth: Payer: Self-pay | Admitting: *Deleted

## 2023-10-13 NOTE — Transitions of Care (Post Inpatient/ED Visit) (Signed)
10/13/2023  Name: [name] MRN: [id number] DOB: [birth year]  Today's TOC FU Call Status: Today's TOC FU Call Status:: Unsuccessful Call (1st Attempt) Unsuccessful Call (1st Attempt) Date: 10/13/23  Attempted to reach the patient regarding the most recent Inpatient visit; left HIPAA compliant voice message requesting call back  Follow Up Plan: Additional outreach attempts will be made to reach the patient to complete the Transitions of Care (Post Inpatient visit) call.   Caryl Pina, RN, BSN, Media planner  Transitions of Care  [facility] [phone]: direct office

## 2023-10-14 ENCOUNTER — Telehealth: Payer: Self-pay | Admitting: *Deleted

## 2023-10-14 NOTE — Transitions of Care (Post Inpatient/ED Visit) (Signed)
10/14/2023  Name: [name] MRN: [id number] DOB: 12/02/1931  Today's TOC FU Call Status: Today's TOC FU Call Status:: Unsuccessful Call (2nd Attempt) Unsuccessful Call (2nd Attempt) Date: 10/14/23  Attempted to reach the patient regarding the most recent Inpatient visit; left HIPAA compliant voice message requesting call back  Follow Up Plan: Additional outreach attempts will be made to reach the patient to complete the Transitions of Care (Post Inpatient visit) call.   Caryl Pina, RN, BSN, Media planner  Transitions of Care  [facility] - [facility] [phone]: direct office
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
## 2023-10-21 NOTE — Plan of Care (Signed)
 [facility] Tonsillectomy/Adenoidectomy, Postoperative PEDS care plan entered in error.

## 2023-10-25 ENCOUNTER — Ambulatory Visit: Payer: Medicare Other | Admitting: Internal Medicine

## 2023-10-25 NOTE — Progress Notes (Deleted)
Patient ID: [name], female   DOB: [birth year], 89 y.o.   MRN: [id number]  HPI: [name] is a 89 y.o.-year-old female, self-referred (her daughter is also my patient), for management of DM2, dx in 1994, insulin-dependent, uncontrolled, with complications (CAD, s/p NSTEMI 2008; CHF; PAD; atrial fibrillation/atrial flutter; peripheral neuropathy; CKD stage IIIa).   Last visit with me 4 months ago.  Patient's daughter accompanies her today and offers information about patient's diet, activity, and insulin doses.  Interim history: No increased urination, blurry vision, nausea, chest pain.    Reviewed HbA1c: Lab Results  Component Value Date   HGBA1C 7.5 (H) 10/08/2023   HGBA1C 6.9 06/23/2023   HGBA1C 7.3 (A) 02/03/2023   HGBA1C 7.0 (A) 07/29/2022   HGBA1C 8.4 (A) 03/26/2022   HGBA1C 7.3 (A) 12/25/2021   HGBA1C 9.0 (A) 09/14/2021   HGBA1C 8.3 (A) 05/28/2021   HGBA1C 8.0 (A) 03/24/2021   HGBA1C 9.5 (A) 12/02/2020   HGBA1C 7.8 (A) 08/21/2020   HGBA1C 8.3 (H) 05/15/2020   HGBA1C 8.0 (A) 05/06/2020   HGBA1C 9.2 (H) 02/20/2020   HGBA1C 7.9 (A) 11/14/2019   HGBA1C 7.6 (A) 08/22/2019   HGBA1C 8.1 (A) 05/01/2019   HGBA1C 8.9 (H) 01/31/2019   HGBA1C 7.0 (A) 12/06/2018   HGBA1C 6.5 (A) 09/20/2018   HGBA1C 6.4 (A) 07/19/2018   HGBA1C 7.3 05/08/2018   HGBA1C 8.0 (H) 01/30/2018   HGBA1C 8.3 11/23/2017   HGBA1C 7.4 08/23/2017   HGBA1C 8.2 01/24/2017   HGBA1C 7.7 09/08/2016   HGBA1C 8.4 (H) 06/09/2016   HGBA1C 7.8 (H) 03/04/2016   HGBA1C 7.6 12/08/2015   HGBA1C 8.3 08/26/2015   HGBA1C 7.9 (H) 05/24/2015   HGBA1C 8.6 (H) 02/06/2015   HGBA1C 8.7 (H) 11/15/2014   HGBA1C 8.1 (H) 08/21/2014   HGBA1C 9.6 (H) 01/21/2014   HGBA1C 9.8 (H) 09/13/2013   HGBA1C 9.7 (H) 05/15/2013   HGBA1C 10.9 (H) 02/01/2013   HGBA1C 8.9 (H) 09/26/2012   At last visit she was on: - Humalog 75/25 >> 70/30 Novolin (24$ at [facility], w/o Rx) 32 units before breakfast and 6 units before dinner >> .Marland KitchenMarland Kitchen  14 units 30 min before breakfast and 10-12 units 30 min before dinner - Ozempic 0.25 >> 0.5 mg weekly-added 08/2021 >> stopped 12/2021 2/2 $$ but now new insurance >> restarted (patient assistance)  Currently on: - Ozempic 0.5 mg weekly - Lantus 15 units daily at night - NovoLog 4-6 units 3x a day 10-15 min before meals (may not need this before b'fast and lunch)  Pt checks her sugars 2-4x a day and they are: - am:  89-158, 164, 186 >> 92-170 >> 96-130, 188 - 2h after b'fast: n/c >> 154 >> 172 >> n/c - before lunch: 248 >> 189 >> 94-179 >> 90-130s - 2h after lunch: n/c  >> 148 >> 45 - before dinner: 78-199  >> 116-140, 216 >> 90-130s - 2h after dinner: 171, 198 >> n/c >> 165 >> 200's - bedtime: 6110-209, 274, 497 >> 130, 389, 497 >> n/c - nighttime: n/c >> 93-227 >> 186-286 >>  Lowest sugar was 59 >> 92 >> 45; she has hypoglycemia awareness at 70.  Highest sugar was 497 (steroids) >> 250.  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  Pt's meals are: - Breakfast: frozen banana, berries, oatmeal, protein powder >> yogurt and fruit - dinner: Includes soup  But she has lunch and dinner in the cafeteria [facility] - facility: [place]):   -+ History  of CKD stage IIIa, last BUN/creatinine:  Lab Results  Component Value Date   BUN 25 (H) 10/08/2023   BUN 26 (H) 08/30/2023   CREATININE 0.86 10/08/2023   CREATININE 1.13 08/30/2023   Lab Results  Component Value Date   MICRALBCREAT 14.7 07/31/2018   MICRALBCREAT 4.4 09/08/2016   MICRALBCREAT 25.0 02/06/2015   MICRALBCREAT 1.7 05/15/2013   MICRALBCREAT 10.6 09/26/2012   MICRALBCREAT 6.9 05/29/2012   MICRALBCREAT 1.7 07/13/2011   MICRALBCREAT 7.3 10/28/2009   MICRALBCREAT 10.3 01/25/2008   MICRALBCREAT 31.5 (H) 10/13/2007  She is not on an ACE inhibitor/ARB  -+ HL; last set of lipids: Lab Results  Component Value Date   CHOL 315 (H) 01/18/2023   HDL 54.10 01/18/2023   LDLCALC 229 (H) 01/18/2023   LDLDIRECT 144.6  02/03/2010   TRIG 161.0 (H) 01/18/2023   CHOLHDL 6 01/18/2023  She had muscle weakness from statins.   - last eye exam was in 2023 (?): No DR reportedly.  - no numbness and tingling in her feet.  She saw podiatry, but not recently.  Last foot exam 02/15/2023.  Pt has FH of DM in mother.  She also has a history of HTN, osteoporosis, protein-calorie malnutrition, iron deficiency anemia. No FH of MTC, no pancreatitis hx.  ROS: + See HPI  Past Medical History:  Diagnosis Date   Anemia    iron defficiency   Anginal pain (HCC)    Arthritis    "knees, feet, hands; joints" (05/27/2015)   Asthma    Atrial fibrillation or flutter    s/p RFCA 7/08;   s/p DCCV in past;   previously on amiodarone;  amio stopped due to lung toxicity   CAD (coronary artery disease)    s/p NSTEMI tx with BMS to OM1 3/08;  cath 3/08: pOM 99% tx with PCI, pLAD 20%, ? mod stenosis at the AM   CAP (community acquired pneumonia) 05/24/2015   CHF (congestive heart failure) (HCC)    Chronic diastolic heart failure (HCC)    echo 11/11:  EF 55-60%, severe LVH, mod LAE, mild MR, mildly increased PASP   COPD (chronic obstructive pulmonary disease) (HCC)    Degenerative joint disease    Dystrophy, corneal stromal    Heart murmur    HLD (hyperlipidemia)    HTN (hypertension)    essential nos   Hypopotassemia    PMH of   Muscle pain    Myocardial infarction (HCC) 2008   OSA on CPAP    Osteoporosis    Pneumonia 05/27/2015   Protein calorie malnutrition (HCC)    Rash and nonspecific skin eruption    both arms,awaiting bio   dr Emily Filbert   Seasonal allergies    Shortness of breath dyspnea    Type II diabetes mellitus (HCC)    Dr Everardo All   Past Surgical History:  Procedure Laterality Date   A FLUTTER ABLATION     Dr Ladona Ridgel   ABDOMINAL AORTOGRAM W/LOWER EXTREMITY Bilateral 04/11/2020   Procedure: ABDOMINAL AORTOGRAM W/LOWER EXTREMITY;  Surgeon: Chuck Hint, MD;  Location: [facility] INVASIVE CV LAB;  Service:  Cardiovascular;  Laterality: Bilateral;   CATARACT EXTRACTION W/ INTRAOCULAR LENS  IMPLANT, BILATERAL Bilateral    CHOLECYSTECTOMY     COLONOSCOPY  6/07   2 polyps Dr. Elvina Mattes in [place]   colonoscopy with polypectomy      X 2;  GI   CORNEAL TRANSPLANT Bilateral    CORONARY ANGIOPLASTY WITH STENT PLACEMENT  02/2007   BMS w/Dr  Ladona Ridgel   CORONARY ATHERECTOMY N/A 05/16/2020   Procedure: CORONARY ATHERECTOMY;  Surgeon: Swaziland, Peter M, MD;  Location: [facility] INVASIVE CV LAB;  Service: Cardiovascular;  Laterality: N/A;   CORONARY BALLOON ANGIOPLASTY N/A 05/16/2020   Procedure: CORONARY BALLOON ANGIOPLASTY;  Surgeon: Swaziland, Peter M, MD;  Location: [facility] INVASIVE CV LAB;  Service: Cardiovascular;  Laterality: N/A;   CORONARY STENT INTERVENTION N/A 05/16/2020   Procedure: CORONARY STENT INTERVENTION;  Surgeon: Swaziland, Peter M, MD;  Location: [facility] INVASIVE CV LAB;  Service: Cardiovascular;  Laterality: N/A;   CORONARY ULTRASOUND/IVUS N/A 05/16/2020   Procedure: Intravascular Ultrasound/IVUS;  Surgeon: Swaziland, Peter M, MD;  Location: [facility] INVASIVE CV LAB;  Service: Cardiovascular;  Laterality: N/A;   EYE SURGERY     gravid 2 para 2     KNEE CARTILAGE SURGERY Right    LEFT AND RIGHT HEART CATHETERIZATION WITH CORONARY ANGIOGRAM N/A 05/07/2014   Procedure: LEFT AND RIGHT HEART CATHETERIZATION WITH CORONARY ANGIOGRAM;  Surgeon: Peter M Swaziland, MD;  Location: [facility] CATH LAB;  Service: Cardiovascular;  Laterality: N/A;   PERIPHERAL VASCULAR INTERVENTION  04/11/2020   Procedure: PERIPHERAL VASCULAR INTERVENTION;  Surgeon: Chuck Hint, MD;  Location: [facility] INVASIVE CV LAB;  Service: Cardiovascular;;   RIGHT HEART CATH N/A 02/04/2021   Procedure: RIGHT HEART CATH;  Surgeon: Dolores Patty, MD;  Location: [facility] INVASIVE CV LAB;  Service: Cardiovascular;  Laterality: N/A;   RIGHT/LEFT HEART CATH AND CORONARY ANGIOGRAPHY N/A 05/16/2020   Procedure: RIGHT/LEFT HEART CATH AND CORONARY ANGIOGRAPHY;  Surgeon: Dolores Patty, MD;  Location: [facility] INVASIVE CV LAB;  Service: Cardiovascular;  Laterality: N/A;   TOTAL KNEE ARTHROPLASTY Right 06/28/2016   Procedure: TOTAL KNEE ARTHROPLASTY;  Surgeon: Gean Birchwood, MD;  Location: [facility] OR;  Service: Orthopedics;  Laterality: Right;   Social History   Socioeconomic History   Marital status: Widowed    Spouse name: Not on file   Number of children: 2   Years of education: 16   Highest education level: Bachelor's degree (e.g., BA, AB, BS)  Occupational History   Occupation: Teacher/ Retired  Tobacco Use   Smoking status: Former    Current packs/day: 0.00    Average packs/day: 0.3 packs/day for 18.0 years (4.5 ttl pk-yrs)    Types: Cigarettes    Start date: 12/27/1950    Quit date: 12/27/1968    Years since quitting: 54.8   Smokeless tobacco: Never   Tobacco comments:    smoked 1952- 1970, up to 1 pp week  Vaping Use   Vaping status: Never Used  Substance and Sexual Activity   Alcohol use: Yes    Comment: occ   Drug use: No   Sexual activity: Never  Other Topics Concern   Not on file  Social History Narrative   Teacher. Widowed. Rarely drinks cafeine.    Social Determinants of Health   Financial Resource Strain: Low Risk  (09/08/2023)   Overall Financial Resource Strain (CARDIA)    Difficulty of Paying Living Expenses: Not hard at all  Food Insecurity: No Food Insecurity (10/14/2023)   Hunger Vital Sign    Worried About Running Out of Food in the Last Year: Never true    Ran Out of Food in the Last Year: Never true  Transportation Needs: No Transportation Needs (10/14/2023)   PRAPARE - Administrator, Civil Service (Medical): No    Lack of Transportation (Non-Medical): No  Physical Activity: Sufficiently Active (09/08/2023)   Exercise Vital Sign  Days of Exercise per Week: 5 days    Minutes of Exercise per Session: 30 min  Stress: No Stress Concern Present (09/08/2023)   [employer] of Occupational Health - Occupational Stress  Questionnaire    Feeling of Stress : Not at all  Social Connections: Socially Integrated (09/08/2023)   Social Connection and Isolation Panel [NHANES]    Frequency of Communication with Friends and Family: More than three times a week    Frequency of Social Gatherings with Friends and Family: More than three times a week    Attends Religious Services: More than 4 times per year    Active Member of [employer] or Organizations: Yes    Attends Engineer, structural: More than 4 times per year    Marital Status: Married  Catering manager Violence: Not At Risk (10/08/2023)   Humiliation, Afraid, Rape, and Kick questionnaire    Fear of Current or Ex-Partner: No    Emotionally Abused: No    Physically Abused: No    Sexually Abused: No   Current Outpatient Medications on File Prior to Visit  Medication Sig Dispense Refill   acetaminophen (TYLENOL) 325 MG tablet Take 2 tablets (650 mg total) by mouth every 6 (six) hours as needed for mild pain or headache. 20 tablet 0   amLODipine (NORVASC) 5 MG tablet TAKE 1 TABLET BY MOUTH DAILY 90 tablet 3   Blood Glucose Monitoring Suppl (ONETOUCH VERIO) w/Device KIT Use as instructed to check blood sugar 1 kit 0   carvedilol (COREG) 6.25 MG tablet TAKE 1 TABLET BY MOUTH TWICE  DAILY WITH MEALS 180 tablet 3   Continuous Glucose Receiver (FREESTYLE LIBRE 3 READER) DEVI Use to check glucose continuously. 1 each 0   Continuous Glucose Sensor (FREESTYLE LIBRE 3 SENSOR) MISC Place 1 sensor on the skin every 14 days. Use to check glucose continuously 2 each 5   ELIQUIS 5 MG TABS tablet TAKE 1 TABLET BY MOUTH TWICE  DAILY 180 tablet 3   fluticasone (FLONASE) 50 MCG/ACT nasal spray Place 1 spray into both nostrils daily as needed for allergies or rhinitis. 16 g 2   insulin aspart (NOVOLOG FLEXPEN) 100 UNIT/ML FlexPen Inject 4-6 Units into the skin 3 (three) times daily before meals. 15 mL 3   Insulin Pen Needle (BD PEN NEEDLE NANO 2ND GEN) 32G X 4 MM MISC Use 3x a  day as instructed 300 each 3   ipratropium-albuterol (DUONEB) 0.5-2.5 (3) MG/3ML SOLN Take 3 mLs by nebulization every 6 (six) hours as needed. 360 mL 1   Lancet Devices (LANCING DEVICE) MISC Use as advised - for Delica Lancets 1 each 0   Lancets (ONETOUCH DELICA PLUS LANCET33G) MISC USE TO MONITOR GLUCOSE  LEVELS TWICE DAILY 200 each 3   Multiple Vitamins-Minerals (CENTRUM SILVER ULTRA WOMENS PO) Take 1 tablet by mouth daily.     nitroGLYCERIN (NITROSTAT) 0.4 MG SL tablet Place 1 tablet (0.4 mg total) under the tongue every 5 (five) minutes as needed for chest pain. Reported on 01/28/2016 30 tablet 3   ONETOUCH VERIO test strip CHECK BLOOD SUGAR TWICE DAILY 200 strip 3   OXYGEN Inhale 3 Doses into the lungs as needed. 3 liters     potassium chloride SA (KLOR-CON M) 20 MEQ tablet TAKE 2 TABLETS BY MOUTH DAILY 180 tablet 3   Semaglutide,0.25 or 0.5MG /DOS, (OZEMPIC, 0.25 OR 0.5 MG/DOSE,) 2 MG/1.5ML SOPN Inject 0.5 mg into the skin once a week. 4.5 mL 3   torsemide (DEMADEX) 20  MG tablet TAKE 2 TABLETS BY MOUTH TWICE  DAILY 360 tablet 3   Vitamin D, Cholecalciferol, 25 MCG (1000 UT) CAPS Take 1 capsule by mouth daily.     No current facility-administered medications on file prior to visit.   Allergies  Allergen Reactions   Levaquin [Levofloxacin] Shortness Of Breath and Swelling    angioedema   Lobster [Shellfish Allergy] Other (See Comments)    angioedema   Penicillins Rash    Has patient had a PCN reaction causing immediate rash, facial/tongue/throat swelling, SOB or lightheadedness with hypotension: Yes Has patient had a PCN reaction causing severe rash involving mucus membranes or skin necrosis: No Has patient had a PCN reaction that required hospitalization: No Has patient had a PCN reaction occurring within the last 10 years: No If all of the above answers are "NO", then may proceed with Cephalosporin use.   Valsartan Other (See Comments)    REACTION: angioedema   Codeine Other (See  Comments)    Mental status changes   Lipitor [Atorvastatin] Other (See Comments)    weakness   Oxycodone Other (See Comments)    hallucinations   Statins     Made her too weak   Tramadol    Amlodipine Besylate Other (See Comments)    REACTION: tingling in lips & gum edema 7/12: talked to patient, states she is tolerating well   Crestor [Rosuvastatin Calcium] Rash   Tramadol Hcl Nausea And Vomiting   Family History  Problem Relation Age of Onset   Diabetes Mother    Hypertension Mother    Transient ischemic attack Mother    Arthritis Mother    Heart attack Father 41   Arthritis Father    Hypertension Father    Breast cancer Maternal Aunt    Arthritis Maternal Aunt    PE: There were no vitals taken for this visit. Wt Readings from Last 10 Encounters:  10/08/23 155 lb 11.2 oz (70.6 kg)  09/08/23 148 lb (67.1 kg)  08/30/23 148 lb (67.1 kg)  06/23/23 151 lb 12.8 oz (68.9 kg)  06/02/23 153 lb (69.4 kg)  02/17/23 153 lb 9.6 oz (69.7 kg)  02/03/23 155 lb 3.2 oz (70.4 kg)  01/18/23 154 lb (69.9 kg)  01/17/23 155 lb (70.3 kg)  09/06/22 157 lb (71.2 kg)   Constitutional: Slightly overweight, in NAD Eyes: EOMI, no exophthalmos ENT: no thyromegaly, no cervical lymphadenopathy Cardiovascular: RRR, No MRG, B pitting LE edema - wears compression hoses Respiratory: CTA B Musculoskeletal: no deformities Skin: + stasis dermatitis rash - bilateral lower legs Neurological: no tremor with outstretched hands  ASSESSMENT: 1. DM2, insulin-dependent, uncontrolled, with complications - CAD, s/p NSTEMI 2008 - CHF - PAD - h/o R stent - atrial fibrillation/atrial flutter -previously on amiodarone - peripheral neuropathy - CKD stage IIIa  2. Obesity class 1  3. HL  PLAN:  1. Patient with longstanding, uncontrolled, type 2 diabetes, previously on premixed insulin regimen and now on basal/bolus insulin and GLP-1 receptor agonist, with improving blood sugar control at last visit (HbA1c  6.9%), but with latest HbA1c obtained earlier this month higher, at 7.5%. - at last visit, sugars appears to be mostly at goal reportedly but she was occasionally forgetting to eat, which became a problem if she had no voiding in the system.  I advised her to stop the premixed insulin and switch to basal large bolus insulin regimen.  I advised her to skip the mealtime insulin if she planned to skip a meal.  -  I suggested to:  Patient Instructions  Please continue: - Lantus 15 units daily at night - NovoLog 4-6 units 3x a day 10-15 min before meals (may not need this before b'fast and lunch) - Ozempic 0.5 mg weekly  Please return in 3-4 months with your sugar log.   - we checked her HbA1c: 7%  - advised to check sugars at different times of the day - 4x a day, rotating check times - advised for yearly eye exams >> she is UTD - return to clinic in 3-4 months  2.  Obesity class I -At previous visits, I suggested to increase intake of fresh fruit, dried fruit, and other snacks.  I also recommended to try to switch from smoothies in the morning to oatmeal. -At last visits we discussed about stopping protein shakes between meals -Will continue Ozempic which should also help with weight loss -She lost 15 pounds before the last 3 visits combined and gained 4 lbs since then  3. HL -Reviewed latest lipid panel from 12/2022: LDL extremely high, triglycerides mildly elevated: Lab Results  Component Value Date   CHOL 315 (H) 01/18/2023   HDL 54.10 01/18/2023   LDLCALC 229 (H) 01/18/2023   LDLDIRECT 144.6 02/03/2010   TRIG 161.0 (H) 01/18/2023   CHOLHDL 6 01/18/2023  -She is not on a statin due to previous muscle weakness -Options for treatment are ezetimibe and evolocumab/alirocumab.  However, she is on palliative care, with only mandatory medications.  Further management per PCP.  Carlus Pavlov, MD PhD [facility]

## 2023-10-27 ENCOUNTER — Encounter (HOSPITAL_COMMUNITY): Payer: Self-pay | Admitting: Internal Medicine

## 2023-10-27 ENCOUNTER — Ambulatory Visit (HOSPITAL_COMMUNITY)
Admission: RE | Admit: 2023-10-27 | Discharge: 2023-10-27 | Disposition: A | Discharge status: Home / Self Care | Payer: Medicare Other | Source: Ambulatory Visit | Attending: Internal Medicine | Admitting: Internal Medicine

## 2023-10-27 VITALS — BP 110/50 | HR 68 | Wt 150.8 lb

## 2023-10-27 DIAGNOSIS — Z66 Do not resuscitate: Secondary | ICD-10-CM | POA: Insufficient documentation

## 2023-10-27 DIAGNOSIS — I5030 Unspecified diastolic (congestive) heart failure: Secondary | ICD-10-CM | POA: Diagnosis present

## 2023-10-27 DIAGNOSIS — I10 Essential (primary) hypertension: Secondary | ICD-10-CM | POA: Diagnosis not present

## 2023-10-27 DIAGNOSIS — E119 Type 2 diabetes mellitus without complications: Secondary | ICD-10-CM | POA: Diagnosis not present

## 2023-10-27 DIAGNOSIS — I110 Hypertensive heart disease with heart failure: Secondary | ICD-10-CM | POA: Diagnosis not present

## 2023-10-27 DIAGNOSIS — I5032 Chronic diastolic (congestive) heart failure: Secondary | ICD-10-CM | POA: Diagnosis not present

## 2023-10-27 DIAGNOSIS — I5022 Chronic systolic (congestive) heart failure: Secondary | ICD-10-CM | POA: Diagnosis present

## 2023-10-27 DIAGNOSIS — E785 Hyperlipidemia, unspecified: Secondary | ICD-10-CM | POA: Insufficient documentation

## 2023-10-27 DIAGNOSIS — I2510 Atherosclerotic heart disease of native coronary artery without angina pectoris: Secondary | ICD-10-CM | POA: Diagnosis not present

## 2023-10-27 DIAGNOSIS — I272 Pulmonary hypertension, unspecified: Secondary | ICD-10-CM | POA: Diagnosis not present

## 2023-10-27 DIAGNOSIS — J449 Chronic obstructive pulmonary disease, unspecified: Secondary | ICD-10-CM | POA: Diagnosis not present

## 2023-10-27 DIAGNOSIS — I4892 Unspecified atrial flutter: Secondary | ICD-10-CM | POA: Insufficient documentation

## 2023-10-27 DIAGNOSIS — G4733 Obstructive sleep apnea (adult) (pediatric): Secondary | ICD-10-CM | POA: Diagnosis not present

## 2023-10-27 DIAGNOSIS — I252 Old myocardial infarction: Secondary | ICD-10-CM | POA: Diagnosis not present

## 2023-10-27 DIAGNOSIS — I739 Peripheral vascular disease, unspecified: Secondary | ICD-10-CM | POA: Diagnosis not present

## 2023-10-27 DIAGNOSIS — I482 Chronic atrial fibrillation, unspecified: Secondary | ICD-10-CM | POA: Insufficient documentation

## 2023-10-27 DIAGNOSIS — Z955 Presence of coronary angioplasty implant and graft: Secondary | ICD-10-CM | POA: Insufficient documentation

## 2023-10-27 NOTE — Progress Notes (Addendum)
[facility] Note   Date:  10/27/2023   ID:  [name], DOB 1931-02-09, MRN [id number]  Location: Home  Provider location: [facility] Type of Visit: Established patient  PCP:  Pincus Sanes, MD  Cardiologist:  Chrystie Nose, MD Primary HF: Arvie Bartholomew  Chief Complaint: Heart Failure follow-up   History of Present Illness:  [name] is a 89 y.o. female with CAD s/p 1v stent, HTN, atrial fibrillation/A flutter and chronic prior systolic heart failure, (which was likely rate related) and diastolic heart failure. She was referred by Dr. Ladona Ridgel for further evaluation of Pulmonary HTN.   Echo 2/22 EF 40-45%   Underwent R/L cath 05/07/14 Which showed stable CAD and significant PH with normal PVR.   In 4/21 saw Dr. Edilia Bo and had LE angio with severe PAD underwent stenting of 70% R external iliac   Here for f/u with her daughter. Previously she enrolled with home Palliative Care and we cut back many of her meds.  Admitted with viral PNA earlier this month. Says she is still trying to get over it. Denies cough, fevers or chills. Says still very SOB with any activity. No orthopnea. No CP. No edema   ReDS 31%  Cardiac studies:  Myoview 9/20  Nuclear stress EF: 56%. There was no ST segment deviation noted during stress. Defect 1: There is a medium defect of moderate severity present in the mid inferolateral, apical inferior and apical lateral location. Findings consistent with possible mild ischemia in the apical inferior and inferolateral regions. The left ventricular ejection fraction is normal (55-65%).    Admitted in 5/21 with NSTEMI hstrop peak 1,455. Echo 5/21: EF 50-55%  Cath 05/16/20 with  1. Severe 3 vessel obstructive CAD 2. Severe pulmonary HTN 3. Successful PCI of the proximal LCx/OM1 with DES x 1 with IVUS guidance 4. Successful PCI of the proximal LAD with orbital atherectomy and DES x 1  RA =  6 RV = 75/6 PA = 77/26 (45) PCW = 10 Fick cardiac output/index = 4.0/2.2 PVR = 8.8 WU FA sat = 99% PA sat = 67%, 73%  Admitted 2/22 with volume overload. Diuresed. Repeat RHC on 02/04/21 as below. Diuresed and started on sildenafil. D/c weight 167  RA = 6 RV = 88/5 PA =  86/33 (53) PCW = 21 Fick cardiac output/index = 3.4/1.9 PVR = 9.4 WU Ao sat = 97% PA sat = 64%, 64% PAPi = 8.8    Past Medical History:  Diagnosis Date   Anemia    iron defficiency   Anginal pain (HCC)    Arthritis    "knees, feet, hands; joints" (05/27/2015)   Asthma    Atrial fibrillation or flutter    s/p RFCA 7/08;   s/p DCCV in past;   previously on amiodarone;  amio stopped due to lung toxicity   CAD (coronary artery disease)    s/p NSTEMI tx with BMS to OM1 3/08;  cath 3/08: pOM 99% tx with PCI, pLAD 20%, ? mod stenosis at the AM   CAP (community acquired pneumonia) 05/24/2015   CHF (congestive heart failure) (HCC)    Chronic diastolic heart failure (HCC)    echo 11/11:  EF 55-60%, severe LVH, mod LAE, mild MR, mildly increased PASP   COPD (chronic obstructive pulmonary disease) (HCC)    Degenerative joint disease    Dystrophy, corneal stromal    Heart murmur    HLD (hyperlipidemia)  HTN (hypertension)    essential nos   Hypopotassemia    PMH of   Muscle pain    Myocardial infarction (HCC) 2008   OSA on CPAP    Osteoporosis    Pneumonia 05/27/2015   Protein calorie malnutrition (HCC)    Rash and nonspecific skin eruption    both arms,awaiting bio   dr Emily Filbert   Seasonal allergies    Shortness of breath dyspnea    Type II diabetes mellitus (HCC)    Dr Everardo All   Past Surgical History:  Procedure Laterality Date   A FLUTTER ABLATION     Dr Ladona Ridgel   ABDOMINAL AORTOGRAM W/LOWER EXTREMITY Bilateral 04/11/2020   Procedure: ABDOMINAL AORTOGRAM W/LOWER EXTREMITY;  Surgeon: Chuck Hint, MD;  Location: [facility] INVASIVE CV LAB;  Service: Cardiovascular;  Laterality: Bilateral;   CATARACT  EXTRACTION W/ INTRAOCULAR LENS  IMPLANT, BILATERAL Bilateral    CHOLECYSTECTOMY     COLONOSCOPY  6/07   2 polyps Dr. Elvina Mattes in [place]   colonoscopy with polypectomy      X 2; [facility]   CORNEAL TRANSPLANT Bilateral    CORONARY ANGIOPLASTY WITH STENT PLACEMENT  02/2007   BMS w/Dr Ladona Ridgel   CORONARY ATHERECTOMY N/A 05/16/2020   Procedure: CORONARY ATHERECTOMY;  Surgeon: Swaziland, Peter M, MD;  Location: [facility] INVASIVE CV LAB;  Service: Cardiovascular;  Laterality: N/A;   CORONARY BALLOON ANGIOPLASTY N/A 05/16/2020   Procedure: CORONARY BALLOON ANGIOPLASTY;  Surgeon: [place]azil[place], Peter M, MD;  Location: [facility] INVASIVE CV LAB;  Service: Cardiovascular;  Laterality: N/A;   CORONARY STENT INTERVENTION N/A 05/16/2020   Procedure: CORONARY STENT INTERVENTION;  Surgeon: Swaziland, Peter M, MD;  Location: [facility] INVASIVE CV LAB;  Service: Cardiovascular;  Laterality: N/A;   CORONARY ULTRASOUND/IVUS N/A 05/16/2020   Procedure: Intravascular Ultrasound/IVUS;  Surgeon: [place]azil[place], Peter M, MD;  Location: [facility] INVASIVE CV LAB;  Service: Cardiovascular;  Laterality: N/A;   EYE SURGERY     gravid 2 para 2     KNEE CARTILAGE SURGERY Right    LEFT AND RIGHT HEART CATHETERIZATION WITH CORONARY ANGIOGRAM N/A 05/07/2014   Procedure: LEFT AND RIGHT HEART CATHETERIZATION WITH CORONARY ANGIOGRAM;  Surgeon: Peter M Swaziland, MD;  Location: [facility] CATH LAB;  Service: Cardiovascular;  Laterality: N/A;   PERIPHERAL VASCULAR INTERVENTION  04/11/2020   Procedure: PERIPHERAL VASCULAR INTERVENTION;  Surgeon: Chuck Hint, MD;  Location: [facility] INVASIVE CV LAB;  Service: Cardiovascular;;   RIGHT HEART CATH N/A 02/04/2021   Procedure: RIGHT HEART CATH;  Surgeon: Dolores Patty, MD;  Location: [facility] INVASIVE CV LAB;  Service: Cardiovascular;  Laterality: N/A;   RIGHT/LEFT HEART CATH AND CORONARY ANGIOGRAPHY N/A 05/16/2020   Procedure: RIGHT/LEFT HEART CATH AND CORONARY ANGIOGRAPHY;  Surgeon: Dolores Patty, MD;  Location: [facility] INVASIVE CV LAB;  Service:  Cardiovascular;  Laterality: N/A;   TOTAL KNEE ARTHROPLASTY Right 06/28/2016   Procedure: TOTAL KNEE ARTHROPLASTY;  Surgeon: Gean Birchwood, MD;  Location: [facility] OR;  Service: Orthopedics;  Laterality: Right;     Current Outpatient Medications  Medication Sig Dispense Refill   acetaminophen (TYLENOL) 325 MG tablet Take 2 tablets (650 mg total) by mouth every 6 (six) hours as needed for mild pain or headache. 20 tablet 0   amLODipine (NORVASC) 5 MG tablet TAKE 1 TABLET BY MOUTH DAILY 90 tablet 3   Blood Glucose Monitoring Suppl (ONETOUCH VERIO) w/Device KIT Use as instructed to check blood sugar 1 kit 0   carvedilol (COREG) 6.25 MG tablet TAKE 1 TABLET BY  MOUTH TWICE  DAILY WITH MEALS 180 tablet 3   Continuous Glucose Receiver (FREESTYLE LIBRE 3 READER) DEVI Use to check glucose continuously. 1 each 0   Continuous Glucose Sensor (FREESTYLE LIBRE 3 SENSOR) MISC Place 1 sensor on the skin every 14 days. Use to check glucose continuously 2 each 5   ELIQUIS 5 MG TABS tablet TAKE 1 TABLET BY MOUTH TWICE  DAILY 180 tablet 3   fluticasone (FLONASE) 50 MCG/ACT nasal spray Place 1 spray into both nostrils daily as needed for allergies or rhinitis. 16 g 2   insulin aspart (NOVOLOG FLEXPEN) 100 UNIT/ML FlexPen Inject 4-6 Units into the skin 3 (three) times daily before meals. 15 mL 3   Insulin Pen Needle (BD PEN NEEDLE NANO 2ND GEN) 32G X 4 MM MISC Use 3x a day as instructed 300 each 3   ipratropium-albuterol (DUONEB) 0.5-2.5 (3) MG/3ML SOLN Take 3 mLs by nebulization every 6 (six) hours as needed. 360 mL 1   Lancet Devices (LANCING DEVICE) MISC Use as advised - for Delica Lancets 1 each 0   Lancets (ONETOUCH DELICA PLUS LANCET33G) MISC USE TO MONITOR GLUCOSE  LEVELS TWICE DAILY 200 each 3   Multiple Vitamins-Minerals (CENTRUM SILVER ULTRA WOMENS PO) Take 1 tablet by mouth daily.     nitroGLYCERIN (NITROSTAT) 0.4 MG SL tablet Place 1 tablet (0.4 mg total) under the tongue every 5 (five) minutes as needed for chest  pain. Reported on 01/28/2016 30 tablet 3   ONETOUCH VERIO test strip CHECK BLOOD SUGAR TWICE DAILY 200 strip 3   OXYGEN Inhale 3 Doses into the lungs as needed. 3 liters     potassium chloride SA (KLOR-CON M) 20 MEQ tablet TAKE 2 TABLETS BY MOUTH DAILY 180 tablet 3   Semaglutide,0.25 or 0.5MG /DOS, 2 MG/1.5ML SOPN Inject 0.25 mg into the skin once a week.     torsemide (DEMADEX) 20 MG tablet TAKE 2 TABLETS BY MOUTH TWICE  DAILY 360 tablet 3   Vitamin D, Cholecalciferol, 25 MCG (1000 UT) CAPS Take 1 capsule by mouth daily.     No current facility-administered medications for this encounter.    Allergies:   Levaquin [levofloxacin], Lobster [shellfish allergy], Penicillins, Valsartan, Codeine, Lipitor [atorvastatin], Oxycodone, Statins, Tramadol, Amlodipine besylate, Crestor [rosuvastatin calcium], and Tramadol hcl   Social History:  The patient  reports that she quit smoking about 54 years ago. Her smoking use included cigarettes. She started smoking about 72 years ago. She has a 4.5 pack-year smoking history. She has never used smokeless tobacco. She reports current alcohol use. She reports that she does not use drugs.   Family History:  The patient's family history includes Arthritis in her father, maternal aunt, and mother; Breast cancer in her maternal aunt; Diabetes in her mother; Heart attack (age of onset: 28) in her father; Hypertension in her father and mother; Transient ischemic attack in her mother.   ROS:  Please see the history of present illness.   All other systems are personally reviewed and negative.   Vitals:   10/27/23 0954  BP: (!) 110/50  Pulse: 68  SpO2: 94%  Weight: 68.4 kg (150 lb 12.8 oz)    Wt Readings from Last 3 Encounters:  10/27/23 68.4 kg (150 lb 12.8 oz)  10/08/23 70.6 kg (155 lb 11.2 oz)  09/08/23 67.1 kg (148 lb)   Exam:   General:  Looks younger than stated age. No resp difficulty HEENT: normal Neck: supple. JVP 7-8. Carotids 2+ bilat; no bruits. No  lymphadenopathy or thryomegaly appreciated. Cor: PMI nondisplaced. Irregular rate & rhythm. No rubs, gallops or murmurs. Lungs: clear Abdomen: soft, nontender, nondistended. No hepatosplenomegaly. No bruits or masses. Good bowel sounds. Extremities: no cyanosis, clubbing, rash, edema Neuro: alert & orientedx3, cranial nerves grossly intact. moves all 4 extremities w/o difficulty. Affect pleasant   Recent Labs: 01/18/2023: TSH 0.77 10/08/2023: ALT 17; B Natriuretic Peptide 222.7; BUN 25; Creatinine, Ser 0.86; Potassium 4.4; Sodium 139 10/11/2023: Hemoglobin 12.9; Platelets 164    Wt Readings from Last 3 Encounters:  10/27/23 68.4 kg (150 lb 12.8 oz)  10/08/23 70.6 kg (155 lb 11.2 oz)  09/08/23 67.1 kg (148 lb)     ASSESSMENT AND PLAN:  1) Chronic diastolic CHF:  - Echo 02/2015 EF 60-65%, LA severely dilated, Echo 10/2017: EF 65-70%, grade 3 DD - Echo 5/21 EF 50-55% - Echo 2/22 EF 40-45% - She is SOB after recent PNA. Now NYHA II-III - Volume status ok with ReDS 31% - Continue torsemide 40 bid - Not on Entresto due to h/o angioedema with ARB - Not interested in further HF med titration.  - Recommended incentive spirometer  2) CAD - H/o of stent in 2008 - NSTEMI 5/21. Cath as above. S/p PCI to LAD & OM - No s/s angina - On Plavix, apixaban. Will stop Plavix as she is 1 year out from PCI and high risk for bleeding.  - Unable to tolerate statins. Was on Repatha/zetia per [facility] but she stopped because she wanted to limit her meds. - No change  3) PAD - severe  - s/p stenting to R external iliac with Dr. Edilia Bo - No claudication  4) PAH - Moderate to severe by cath 5/21 and 2/22.  - suspect multifactorial  - she stopped sildenafil due to side effects  5) Chronic atrial fib/flutter - Rate controlled  This patients CHA2DS2-VASc Score and unadjusted Ischemic Stroke Rate (% per year) is equal to 7.2 % stroke rate/year from a score of 5 Above score calculated as 2  points each if present [Age > 75, or Stroke/TIA/TE] - Only taking Eliquis 1x/day due to cost  - Encouraged her to take tak bid - No bleeding   6) OSA/COPD with chronic respiratory failure - Now off CPAP. - remains on home O2 but needs it refilled - Hall walk done today to requalify. Will need 4L with exertion and sleep  Patient Saturations on Room Air at Rest = 95% Patient Saturations on Room Air while Ambulating = 82% Patient Saturations on 4 Liters of oxygen while Ambulating = 94%    7) HTN - Blood pressure well controlled. Continue current regimen.  8) DM2 - Stopped Jardiance due to cost  - No change  9) HL  - Recent LDL 229. Unable to tolerate statins. Refuses to reconsider.  - Followed by Dr. Lawerance Bach  10) DNR/DNI - continue Palliative Care support  - No change   Signed, Arvilla Meres, MD  10/27/2023 10:10 AM  [facility] [address] [facility] [place] [address] [phone] (office) [phone] (fax)

## 2023-10-27 NOTE — Patient Instructions (Signed)
Great to see you today!!!  Continue same medications.  Your physician recommends that you schedule a follow-up appointment in: 6 months (April 2025), **PLEASE CALL OUR OFFICE IN FEBRUARY TO SCHEDULE THIS APPOINTMENT  If you have any questions or concerns before your next appointment please send [place] a message through [place] or call our office at [phone].    TO LEAVE A MESSAGE FOR THE NURSE SELECT OPTION 2, PLEASE LEAVE A MESSAGE INCLUDING: YOUR NAME DATE OF BIRTH CALL BACK NUMBER REASON FOR CALL**this is important as we prioritize the call backs  YOU WILL RECEIVE A CALL BACK THE SAME DAY AS LONG AS YOU CALL BEFORE 4:00 PM  At the [facility], you and your health needs are our priority. As part of our continuing mission to provide you with exceptional heart care, we have created designated Provider Care Teams. These Care Teams include your primary Cardiologist (physician) and Advanced Practice Providers (APPs- Physician Assistants and Nurse Practitioners) who all work together to provide you with the care you need, when you need it.   You may see any of the following providers on your designated Care Team at your next follow up: Dr Arvilla Meres Dr Marca Ancona Dr. Dorthula Nettles Dr. Clearnce Hasten Amy Filbert Schilder, NP Robbie Lis, [place] [facility] [place], [place] Brynda Peon, NP [place] Lee, NP Karle Plumber, PharmD   Please be sure to bring in all your medications bottles to every appointment.    Thank you for choosing [facility]

## 2023-10-27 NOTE — Progress Notes (Signed)
ReDS Vest / Clip - 10/27/23 1000       ReDS Vest / Clip   Station Marker A    Ruler Value 28    ReDS Value Range Low volume    ReDS Actual Value 31

## 2023-10-27 NOTE — Progress Notes (Signed)
Medication Samples have been provided to the patient.  Drug name: Eliquhis       Strength: 5 mg        Qty: 4  LOT: [id number]  Exp.Date: 01/2025  Dosing instructions: Take 1 tablet Twice daily   The patient has been instructed regarding the correct time, dose, and frequency of taking this medication, including desired effects and most common side effects.   [name] 10:11 AM 10/27/2023

## 2023-11-01 ENCOUNTER — Encounter: Payer: Self-pay | Admitting: Internal Medicine

## 2023-11-01 ENCOUNTER — Ambulatory Visit: Payer: Medicare Other | Admitting: Internal Medicine

## 2023-11-01 VITALS — BP 124/80 | HR 52 | Ht 63.0 in | Wt 152.0 lb

## 2023-11-01 DIAGNOSIS — E1122 Type 2 diabetes mellitus with diabetic chronic kidney disease: Secondary | ICD-10-CM

## 2023-11-01 DIAGNOSIS — N1831 Chronic kidney disease, stage 3a: Secondary | ICD-10-CM | POA: Diagnosis not present

## 2023-11-01 DIAGNOSIS — E782 Mixed hyperlipidemia: Secondary | ICD-10-CM

## 2023-11-01 DIAGNOSIS — E66811 Obesity, class 1: Secondary | ICD-10-CM | POA: Diagnosis not present

## 2023-11-01 DIAGNOSIS — Z794 Long term (current) use of insulin: Secondary | ICD-10-CM | POA: Diagnosis not present

## 2023-11-01 NOTE — Patient Instructions (Addendum)
Please change: - NovoLog  Before b'fast: 4-6 units  Before lunch: 8-10 units  Please increase: - Ozempic 0.5 mg weekly  Please return in 3-4 months.

## 2023-11-01 NOTE — Progress Notes (Signed)
Patient ID: [name], female   DOB: 05-14-1931, 89 y.o.   MRN: [id number]  HPI: [name] is a 89 y.o.-year-old female, self-referred (her daughter is also my patient), for management of DM2, dx in 1994, insulin-dependent, uncontrolled, with complications (CAD, s/p NSTEMI 2008; CHF; PAD; atrial fibrillation/atrial flutter; peripheral neuropathy; CKD stage IIIa).   Last visit with me 4 months ago.  Patient's daughter accompanies her today and offers information about patient's diet, activity, and insulin doses.  Interim history: No increased urination, blurry vision, nausea, chest pain.   She had PNA last month >> was on ABx. She recovered well.  Reviewed HbA1c: Lab Results  Component Value Date   HGBA1C 7.5 (H) 10/08/2023   HGBA1C 6.9 06/23/2023   HGBA1C 7.3 (A) 02/03/2023   HGBA1C 7.0 (A) 07/29/2022   HGBA1C 8.4 (A) 03/26/2022   HGBA1C 7.3 (A) 12/25/2021   HGBA1C 9.0 (A) 09/14/2021   HGBA1C 8.3 (A) 05/28/2021   HGBA1C 8.0 (A) 03/24/2021   HGBA1C 9.5 (A) 12/02/2020   HGBA1C 7.8 (A) 08/21/2020   HGBA1C 8.3 (H) 05/15/2020   HGBA1C 8.0 (A) 05/06/2020   HGBA1C 9.2 (H) 02/20/2020   HGBA1C 7.9 (A) 11/14/2019   HGBA1C 7.6 (A) 08/22/2019   HGBA1C 8.1 (A) 05/01/2019   HGBA1C 8.9 (H) 01/31/2019   HGBA1C 7.0 (A) 12/06/2018   HGBA1C 6.5 (A) 09/20/2018   HGBA1C 6.4 (A) 07/19/2018   HGBA1C 7.3 05/08/2018   HGBA1C 8.0 (H) 01/30/2018   HGBA1C 8.3 11/23/2017   HGBA1C 7.4 08/23/2017   HGBA1C 8.2 01/24/2017   HGBA1C 7.7 09/08/2016   HGBA1C 8.4 (H) 06/09/2016   HGBA1C 7.8 (H) 03/04/2016   HGBA1C 7.6 12/08/2015   HGBA1C 8.3 08/26/2015   HGBA1C 7.9 (H) 05/24/2015   HGBA1C 8.6 (H) 02/06/2015   HGBA1C 8.7 (H) 11/15/2014   HGBA1C 8.1 (H) 08/21/2014   HGBA1C 9.6 (H) 01/21/2014   HGBA1C 9.8 (H) 09/13/2013   HGBA1C 9.7 (H) 05/15/2013   HGBA1C 10.9 (H) 02/01/2013   HGBA1C 8.9 (H) 09/26/2012   At last visit she was on: - Humalog 75/25 >> 70/30 Novolin (24$ at [facility], w/o Rx)  32 units before breakfast and 6 units before dinner >> .Marland KitchenMarland Kitchen 14 units 30 min before breakfast and 10-12 units 30 min before dinner - Ozempic 0.25 >> 0.5 mg weekly-added 08/2021 >> stopped 12/2021 2/2 $$ but now new insurance >> restarted (patient assistance)  Currently on: - Ozempic 0.5 >> 0.25 mg weekly 2/2 weight loss - Lantus 15 units daily at night >> stopped 2/2 lows 06/2023 - NovoLog 4-6 units 3x a day 10-15 min before meals   Pt checks her sugars >4x a day and they are:  Prev.: - am:  89-158, 164, 186 >> 92-170 >> 96-130, 188 - 2h after b'fast: n/c >> 154 >> 172 >> n/c - before lunch: 248 >> 189 >> 94-179 >> 90-130s - 2h after lunch: n/c  >> 148 >> 45 - before dinner: 78-199  >> 116-140, 216 >> 90-130s - 2h after dinner: 171, 198 >> n/c >> 165 >> 200's - bedtime: 6110-209, 274, 497 >> 130, 389, 497 >> n/c - nighttime: n/c >> 93-227 >> 186-286 >>  Lowest sugar was 59 >> 92 >> 45 >> 69; she has hypoglycemia awareness at 70.  Highest sugar was 497 (steroids) >> 250 >> >200.  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6 09/26/2012   MICRALBCREAT 6.9 05/29/2012   MICRALBCREAT 1.7 07/13/2011   MICRALBCREAT 7.3 10/28/2009   MICRALBCREAT 10.3 01/25/2008   MICRALBCREAT 31.5 (H) 10/13/2007  She is not on an ACE inhibitor/ARB  -+ HL; last set of lipids: Lab Results  Component Value Date   CHOL  315 (H) 01/18/2023   HDL 54.10 01/18/2023   LDLCALC 229 (H) 01/18/2023   LDLDIRECT 144.6 02/03/2010   TRIG 161.0 (H) 01/18/2023   CHOLHDL 6 01/18/2023  She had muscle weakness from statins.   - last eye exam was in 2023 (?): No DR reportedly.  - no numbness and tingling in her feet.  She saw podiatry, but not recently.  Last foot exam 02/15/2023.  Pt has FH of DM in mother.  She also has a history of HTN, osteoporosis, protein-calorie malnutrition, iron deficiency anemia. No FH of MTC, no pancreatitis hx.  ROS: + See HPI  Past Medical History:  Diagnosis Date   Anemia    iron defficiency   Anginal pain (HCC)    Arthritis    "knees, feet, hands; joints" (05/27/2015)   Asthma    Atrial fibrillation or flutter    s/p RFCA 7/08;   s/p DCCV in past;   previously on amiodarone;  amio stopped due to lung toxicity   CAD (coronary artery disease)    s/p NSTEMI tx with BMS to OM1 3/08;  cath 3/08: pOM 99% tx with PCI, pLAD 20%, ? mod stenosis at the AM   CAP (community acquired pneumonia) 05/24/2015   CHF (congestive heart failure) (HCC)    Chronic diastolic heart failure (HCC)    echo 11/11:  EF 55-60%, severe LVH, mod LAE, mild MR, mildly increased PASP   COPD (chronic obstructive pulmonary disease) (HCC)    Degenerative joint disease    Dystrophy, corneal stromal    Heart murmur    HLD (hyperlipidemia)    HTN (hypertension)    essential nos   Hypopotassemia    PMH of   Muscle pain    Myocardial infarction (HCC) 2008   OSA on CPAP    Osteoporosis    Pneumonia 05/27/2015   Protein calorie malnutrition (HCC)    Rash and nonspecific skin eruption    both arms,awaiting bio   dr Emily Filbert   Seasonal allergies    Shortness of breath dyspnea    Type II diabetes mellitus (HCC)    Dr Everardo All   Past Surgical History:  Procedure Laterality Date   A FLUTTER ABLATION     Dr Ladona Ridgel   ABDOMINAL AORTOGRAM W/LOWER EXTREMITY Bilateral 04/11/2020   Procedure: ABDOMINAL AORTOGRAM W/LOWER  EXTREMITY;  Surgeon: Chuck Hint, MD;  Location: [facility] INVASIVE CV LAB;  Service: Cardiovascular;  Laterality: Bilateral;   CATARACT EXTRACTION W/ INTRAOCULAR LENS  IMPLANT, BILATERAL Bilateral    CHOLECYSTECTOMY     COLONOSCOPY  6/07   2 polyps Dr. Elvina Mattes in [place]   colonoscopy with polypectomy      X 2; [place]  [facility]   CORNEAL TRANSPLANT Bilateral    CORONARY ANGIOPLASTY WITH STENT PLACEMENT  02/2007   BMS w/Dr Ladona Ridgel   CORONARY ATHERECTOMY N/A 05/16/2020   Procedure: CORONARY ATHERECTOMY;  Surgeon: Swaziland, Peter M, MD;  Location: [facility] INVASIVE CV LAB;  Service: Cardiovascular;  Laterality: N/A;   CORONARY BALLOON ANGIOPLASTY N/A 05/16/2020   Procedure: CORONARY BALLOON ANGIOPLASTY;  Surgeon: Swaziland, Peter M, MD;  Location: [facility] INVASIVE CV LAB;  Service: Cardiovascular;  Laterality: N/A;   CORONARY STENT INTERVENTION N/A 05/16/2020   Procedure: CORONARY STENT INTERVENTION;  Surgeon: Swaziland, Peter M, MD;  Location: [facility] INVASIVE CV LAB;  Service: Cardiovascular;  Laterality: N/A;   CORONARY ULTRASOUND/IVUS N/A 05/16/2020   Procedure: Intravascular Ultrasound/IVUS;  Surgeon: Swaziland, Peter M, MD;  Location: [facility] INVASIVE CV LAB;  Service: Cardiovascular;  Laterality: N/A;   EYE SURGERY     gravid 2 para 2     KNEE CARTILAGE SURGERY Right    LEFT AND RIGHT HEART CATHETERIZATION WITH CORONARY ANGIOGRAM N/A 05/07/2014   Procedure: LEFT AND RIGHT HEART CATHETERIZATION WITH CORONARY ANGIOGRAM;  Surgeon: Peter M Swaziland, MD;  Location: [facility] CATH LAB;  Service: Cardiovascular;  Laterality: N/A;   PERIPHERAL VASCULAR INTERVENTION  04/11/2020   Procedure: PERIPHERAL VASCULAR INTERVENTION;  Surgeon: Chuck Hint, MD;  Location: [facility] INVASIVE CV LAB;  Service: Cardiovascular;;   RIGHT HEART CATH N/A 02/04/2021   Procedure: RIGHT HEART CATH;  Surgeon: Dolores Patty, MD;  Location: [facility] INVASIVE CV LAB;  Service: Cardiovascular;  Laterality: N/A;   RIGHT/LEFT HEART CATH AND CORONARY ANGIOGRAPHY N/A 05/16/2020    Procedure: RIGHT/LEFT HEART CATH AND CORONARY ANGIOGRAPHY;  Surgeon: Dolores Patty, MD;  Location: [facility] INVASIVE CV LAB;  Service: Cardiovascular;  Laterality: N/A;   TOTAL KNEE ARTHROPLASTY Right 06/28/2016   Procedure: TOTAL KNEE ARTHROPLASTY;  Surgeon: Gean Birchwood, MD;  Location: [facility] OR;  Service: Orthopedics;  Laterality: Right;   Social History   Socioeconomic History   Marital status: Widowed    Spouse name: Not on file   Number of children: 2   Years of education: 16   Highest education level: Bachelor's degree (e.g., BA, AB, BS)  Occupational History   Occupation: Teacher/ Retired  Tobacco Use   Smoking status: Former    Current packs/day: 0.00    Average packs/day: 0.3 packs/day for 18.0 years (4.5 ttl pk-yrs)    Types: Cigarettes    Start date: 12/27/1950    Quit date: 12/27/1968    Years since quitting: 54.8   Smokeless tobacco: Never   Tobacco comments:    smoked 1952- 1970, up to 1 pp week  Vaping Use   Vaping status: Never Used  Substance and Sexual Activity   Alcohol use: Yes    Comment: occ   Drug use: No   Sexual activity: Never  Other Topics Concern   Not on file  Social History Narrative   Teacher. Widowed. Rarely drinks cafeine.    Social Determinants of Health   Financial Resource Strain: Low Risk  (09/08/2023)   Overall Financial Resource Strain (CARDIA)    Difficulty of Paying Living Expenses: Not hard at all  Food Insecurity: No Food Insecurity (10/14/2023)   Hunger Vital Sign    Worried About Running Out of Food in the Last Year: Never true    Ran Out of Food in the Last Year: Never true  Transportation Needs: No Transportation Needs (10/14/2023)   PRAPARE - Administrator, Civil Service (Medical): No  Lack of Transportation (Non-Medical): No  Physical Activity: Sufficiently Active (09/08/2023)   Exercise Vital Sign    Days of Exercise per Week: 5 days    Minutes of Exercise per Session: 30 min  Stress: No Stress Concern  Present (09/08/2023)   [employer] of Occupational Health - Occupational Stress Questionnaire    Feeling of Stress : Not at all  Social Connections: Socially Integrated (09/08/2023)   Social Connection and Isolation Panel [NHANES]    Frequency of Communication with Friends and Family: More than three times a week    Frequency of Social Gatherings with Friends and Family: More than three times a week    Attends Religious Services: More than 4 times per year    Active Member of [employer] or Organizations: Yes    Attends Engineer, structural: More than 4 times per year    Marital Status: Married  Catering manager Violence: Not At Risk (10/08/2023)   Humiliation, Afraid, Rape, and Kick questionnaire    Fear of Current or Ex-Partner: No    Emotionally Abused: No    Physically Abused: No    Sexually Abused: No   Current Outpatient Medications on File Prior to Visit  Medication Sig Dispense Refill   acetaminophen (TYLENOL) 325 MG tablet Take 2 tablets (650 mg total) by mouth every 6 (six) hours as needed for mild pain or headache. 20 tablet 0   amLODipine (NORVASC) 5 MG tablet TAKE 1 TABLET BY MOUTH DAILY 90 tablet 3   Blood Glucose Monitoring Suppl (ONETOUCH VERIO) w/Device KIT Use as instructed to check blood sugar 1 kit 0   carvedilol (COREG) 6.25 MG tablet TAKE 1 TABLET BY MOUTH TWICE  DAILY WITH MEALS 180 tablet 3   Continuous Glucose Receiver (FREESTYLE LIBRE 3 READER) DEVI Use to check glucose continuously. 1 each 0   Continuous Glucose Sensor (FREESTYLE LIBRE 3 SENSOR) MISC Place 1 sensor on the skin every 14 days. Use to check glucose continuously 2 each 5   ELIQUIS 5 MG TABS tablet TAKE 1 TABLET BY MOUTH TWICE  DAILY 180 tablet 3   fluticasone (FLONASE) 50 MCG/ACT nasal spray Place 1 spray into both nostrils daily as needed for allergies or rhinitis. 16 g 2   insulin aspart (NOVOLOG FLEXPEN) 100 UNIT/ML FlexPen Inject 4-6 Units into the skin 3 (three) times daily before meals.  15 mL 3   Insulin Pen Needle (BD PEN NEEDLE NANO 2ND GEN) 32G X 4 MM MISC Use 3x a day as instructed 300 each 3   ipratropium-albuterol (DUONEB) 0.5-2.5 (3) MG/3ML SOLN Take 3 mLs by nebulization every 6 (six) hours as needed. 360 mL 1   Lancet Devices (LANCING DEVICE) MISC Use as advised - for Delica Lancets 1 each 0   Lancets (ONETOUCH DELICA PLUS LANCET33G) MISC USE TO MONITOR GLUCOSE  LEVELS TWICE DAILY 200 each 3   Multiple Vitamins-Minerals (CENTRUM SILVER ULTRA WOMENS PO) Take 1 tablet by mouth daily.     nitroGLYCERIN (NITROSTAT) 0.4 MG SL tablet Place 1 tablet (0.4 mg total) under the tongue every 5 (five) minutes as needed for chest pain. Reported on 01/28/2016 30 tablet 3   ONETOUCH VERIO test strip CHECK BLOOD SUGAR TWICE DAILY 200 strip 3   OXYGEN Inhale 3 Doses into the lungs as needed. 3 liters     potassium chloride SA (KLOR-CON M) 20 MEQ tablet TAKE 2 TABLETS BY MOUTH DAILY 180 tablet 3   Semaglutide,0.25 or 0.5MG /DOS, 2 MG/1.5ML SOPN Inject 0.25 mg  into the skin once a week.     torsemide (DEMADEX) 20 MG tablet TAKE 2 TABLETS BY MOUTH TWICE  DAILY 360 tablet 3   Vitamin D, Cholecalciferol, 25 MCG (1000 UT) CAPS Take 1 capsule by mouth daily.     No current facility-administered medications on file prior to visit.   Allergies  Allergen Reactions   Levaquin [Levofloxacin] Shortness Of Breath and Swelling    angioedema   Lobster [Shellfish Allergy] Other (See Comments)    angioedema   Penicillins Rash    Has patient had a PCN reaction causing immediate rash, facial/tongue/throat swelling, SOB or lightheadedness with hypotension: Yes Has patient had a PCN reaction causing severe rash involving mucus membranes or skin necrosis: No Has patient had a PCN reaction that required hospitalization: No Has patient had a PCN reaction occurring within the last 10 years: No If all of the above answers are "NO", then may proceed with Cephalosporin use.   Valsartan Other (See Comments)     REACTION: angioedema   Codeine Other (See Comments)    Mental status changes   Lipitor [Atorvastatin] Other (See Comments)    weakness   Oxycodone Other (See Comments)    hallucinations   Statins     Made her too weak   Tramadol    Amlodipine Besylate Other (See Comments)    REACTION: tingling in lips & gum edema 7/12: talked to patient, states she is tolerating well   Crestor [Rosuvastatin Calcium] Rash   Tramadol Hcl Nausea And Vomiting   Family History  Problem Relation Age of Onset   Diabetes Mother    Hypertension Mother    Transient ischemic attack Mother    Arthritis Mother    Heart attack Father 21   Arthritis Father    Hypertension Father    Breast cancer Maternal Aunt    Arthritis Maternal Aunt    PE: BP 124/80   Pulse (!) 52   Ht 5\' 3"  (1.6 m)   Wt 152 lb (68.9 kg)   SpO2 93%   BMI 26.93 kg/m  Wt Readings from Last 10 Encounters:  11/01/23 152 lb (68.9 kg)  10/27/23 150 lb 12.8 oz (68.4 kg)  10/08/23 155 lb 11.2 oz (70.6 kg)  09/08/23 148 lb (67.1 kg)  08/30/23 148 lb (67.1 kg)  06/23/23 151 lb 12.8 oz (68.9 kg)  06/02/23 153 lb (69.4 kg)  02/17/23 153 lb 9.6 oz (69.7 kg)  02/03/23 155 lb 3.2 oz (70.4 kg)  01/18/23 154 lb (69.9 kg)   Constitutional: Slightly overweight, in NAD Eyes: EOMI, no exophthalmos ENT: no thyromegaly, no cervical lymphadenopathy Cardiovascular: RRR, No MRG, B pitting LE edema - wears compression hoses Respiratory: CTA B Musculoskeletal: no deformities Skin: + stasis dermatitis rash - bilateral lower legs Neurological: no tremor with outstretched hands . ASSESSMENT: 1. DM2, insulin-dependent, uncontrolled, with complications - CAD, s/p NSTEMI 2008 - CHF - PAD - h/o R stent - atrial fibrillation/atrial flutter -previously on amiodarone - peripheral neuropathy - CKD stage IIIa  2. Obesity class 1  3. HL  PLAN:  1. Patient with longstanding, uncontrolled, type 2 diabetes, previously on premixed insulin regimen and  now on bolus insulin and GLP-1 receptor agonist, with improving blood sugar control at last visit (HbA1c 6.9%), but with latest HbA1c obtained last month higher, at 7.5%.   - at last visit, sugars appears to be mostly at goal reportedly but she was occasionally forgetting to eat, which became a problem if she had  insulin in the system.  I advised her to stop the premixed insulin and switch to basal large bolus insulin regimen.  I advised her to skip the mealtime insulin if she planned to skip a meal.  However, she developed low blood sugars and we had to stop Lantus 4 months ago. CGM interpretation: -At today's visit, we reviewed her CGM downloads: It appears that 41% of values are in target range (goal >70%), while 51% are higher than 180 (goal <25%), and 1% are lower than 70 (goal <4%).  The calculated average blood sugar is 200.  The projected HbA1c for the next 3 months (GMI) is 8.1%. -Reviewing the CGM trends, sugars are increasing abruptly after lunch and they are very variable in the second half of the day.  They decrease abruptly overnight with a nadir around 1 AM, after which, sugars appear to be increasing. -Upon questioning, she decreased the dose of Ozempic since last visit due to weight loss.  Weight is approximately stable since last visit.  I did advise her to increase the dose back to 0.5 mg weekly. -She would be interested in simplify her regimen and she does mention that she occasionally forgets the doses.  For now, I advised her to skip the dose of insulin for dinner, as she does describe low blood sugars overnight and I can definitely see how the sugars decrease abruptly during the night.  We can still continue off Lantus for now. - I suggested to:  Patient Instructions  Please change: - NovoLog  Before b'fast: 4-6 units  Before lunch: 8-10 units  Please increase: - Ozempic 0.5 mg weekly  Please return in 3-4 months.  - advised to check sugars at different times of the day - 4x  a day, rotating check times - advised for yearly eye exams >> she is UTD - return to clinic in 3-4 months  2.  Obesity class I -At previous visits, I suggested to increase intake of fresh fruit, dried fruit, and other snacks.  I also recommended to try to switch from smoothies in the morning to oatmeal. -At last visit, we discussed about starting protein shakes between meals -Will continue Ozempic which should also help with weight loss -She lost 15 pounds before the last 3 visits combined and lost 1 pound since then  3. HL -Reviewed latest lipid panel from 12/2022: LDL extremely high, triglycerides mildly elevated: Lab Results  Component Value Date   CHOL 315 (H) 01/18/2023   HDL 54.10 01/18/2023   LDLCALC 229 (H) 01/18/2023   LDLDIRECT 144.6 02/03/2010   TRIG 161.0 (H) 01/18/2023   CHOLHDL 6 01/18/2023  -She is not on a statin due to previous muscle weakness -Options for treatment are ezetimibe and evolocumab/alirocumab.  However, she is on palliative care, with only mandatory medications.  Further management per PCP.  Carlus Pavlov, MD PhD [facility] Endocrinology

## 2023-11-02 ENCOUNTER — Ambulatory Visit: Payer: Medicare Other | Admitting: Nurse Practitioner

## 2023-11-02 ENCOUNTER — Telehealth (HOSPITAL_COMMUNITY): Payer: Self-pay | Admitting: Cardiology

## 2023-11-02 DIAGNOSIS — J449 Chronic obstructive pulmonary disease, unspecified: Secondary | ICD-10-CM

## 2023-11-02 DIAGNOSIS — I5032 Chronic diastolic (congestive) heart failure: Secondary | ICD-10-CM

## 2023-11-02 NOTE — Telephone Encounter (Signed)
Patient and [employer] provider called to request order for local oxygen company.  -addendum to 10/31 note is needed from provider  Please include oxygen qualifications from 10/14 hospital note and note pt requests change in oxygen provider to local provider  -requesting from [facility] clinic as pt has seen provider within 30 days   SATURATION QUALIFICATIONS: (This note is used to comply with regulatory documentation for home oxygen)   Patient Saturations on Room Air at Rest = 95%   Patient Saturations on Room Air while Ambulating = 82%   Patient Saturations on 4 Liters of oxygen while Ambulating = 94%   Please briefly explain why patient needs home oxygen:Patient desats with ambulation.   Please call [employer] rep once note addendum complete and order complete

## 2023-11-05 NOTE — Addendum Note (Signed)
Encounter addended by: Dolores Patty, MD on: 11/05/2023 9:50 PM  Actions taken: Clinical Note Signed

## 2023-11-07 NOTE — Telephone Encounter (Signed)
Note complete by provider Order complete [employer] rep aware

## 2023-11-28 ENCOUNTER — Other Ambulatory Visit: Payer: Self-pay | Admitting: Internal Medicine

## 2023-11-28 DIAGNOSIS — N1831 Chronic kidney disease, stage 3a: Secondary | ICD-10-CM

## 2023-11-29 ENCOUNTER — Encounter: Payer: Self-pay | Admitting: Internal Medicine

## 2023-11-29 MED ORDER — SEMAGLUTIDE(0.25 OR 0.5MG/DOS) 2 MG/1.5ML ~~LOC~~ SOPN: 0.2500 mg | PEN_INJECTOR | SUBCUTANEOUS | 0 refills | Status: DC

## 2023-12-29 ENCOUNTER — Other Ambulatory Visit (HOSPITAL_BASED_OUTPATIENT_CLINIC_OR_DEPARTMENT_OTHER): Payer: Self-pay

## 2023-12-30 ENCOUNTER — Other Ambulatory Visit (HOSPITAL_BASED_OUTPATIENT_CLINIC_OR_DEPARTMENT_OTHER): Payer: Self-pay

## 2023-12-30 ENCOUNTER — Other Ambulatory Visit (HOSPITAL_COMMUNITY): Payer: Self-pay | Admitting: Internal Medicine

## 2023-12-30 ENCOUNTER — Other Ambulatory Visit: Payer: Self-pay | Admitting: Internal Medicine

## 2023-12-30 ENCOUNTER — Telehealth: Payer: Self-pay

## 2023-12-30 DIAGNOSIS — E1122 Type 2 diabetes mellitus with diabetic chronic kidney disease: Secondary | ICD-10-CM

## 2023-12-30 DIAGNOSIS — Z794 Long term (current) use of insulin: Secondary | ICD-10-CM

## 2023-12-30 DIAGNOSIS — N183 Chronic kidney disease, stage 3 unspecified: Secondary | ICD-10-CM

## 2023-12-30 MED ORDER — SEMAGLUTIDE(0.25 OR 0.5MG/DOS) 2 MG/3ML ~~LOC~~ SOPN
0.2500 mg | PEN_INJECTOR | SUBCUTANEOUS | 0 refills | Status: DC
  Filled 2023-12-30: qty 3, 56d supply, fill #0

## 2023-12-30 MED ORDER — ONETOUCH VERIO VI STRP
ORAL_STRIP | 3 refills | Status: AC
  Filled 2023-12-30: qty 200, fill #0

## 2023-12-30 MED ORDER — NOVOLOG FLEXPEN 100 UNIT/ML ~~LOC~~ SOPN
4.0000 [IU] | PEN_INJECTOR | Freq: Three times a day (TID) | SUBCUTANEOUS | 3 refills | Status: DC
  Filled 2023-12-30: qty 15, 83d supply, fill #0
  Filled 2024-01-05: qty 15, 84d supply, fill #0
  Filled 2024-04-05: qty 15, 84d supply, fill #1
  Filled 2024-08-02: qty 15, 84d supply, fill #2

## 2023-12-30 MED ORDER — ONETOUCH VERIO W/DEVICE KIT
PACK | 0 refills | Status: AC
  Filled 2023-12-30: qty 1, fill #0

## 2023-12-30 MED ORDER — FREESTYLE LIBRE 3 SENSOR MISC
4 refills | Status: DC
  Filled 2023-12-30: qty 2, 28d supply, fill #0

## 2023-12-30 MED ORDER — NITROGLYCERIN 0.4 MG SL SUBL
0.4000 mg | SUBLINGUAL_TABLET | SUBLINGUAL | 3 refills | Status: DC | PRN
Start: 2023-12-30 — End: 2024-08-21
  Filled 2023-12-30: qty 25, 7d supply, fill #0

## 2023-12-30 MED ORDER — TORSEMIDE 20 MG PO TABS
40.0000 mg | ORAL_TABLET | Freq: Two times a day (BID) | ORAL | 3 refills | Status: DC
  Filled 2023-12-30 – 2024-10-17 (×2): qty 360, 90d supply, fill #0
  Filled 2024-11-18: qty 360, 90d supply, fill #1

## 2023-12-30 MED ORDER — AMLODIPINE BESYLATE 5 MG PO TABS
5.0000 mg | ORAL_TABLET | Freq: Every day | ORAL | 3 refills | Status: DC
  Filled 2023-12-30 – 2024-02-28 (×2): qty 90, 90d supply, fill #0

## 2023-12-30 MED ORDER — FREESTYLE LIBRE 3 READER DEVI
0 refills | Status: DC
  Filled 2023-12-30: qty 1, 90d supply, fill #0
  Filled 2024-11-30 (×2): qty 1, 30d supply, fill #0

## 2023-12-30 MED ORDER — FREESTYLE LIBRE 3 PLUS SENSOR MISC
1.0000 | 3 refills | Status: DC
  Filled 2023-12-30: qty 6, fill #0
  Filled 2024-03-05: qty 6, 84d supply, fill #0
  Filled 2024-05-10 – 2024-05-18 (×4): qty 6, 84d supply, fill #1
  Filled 2024-08-02: qty 6, 84d supply, fill #2
  Filled 2024-10-17: qty 6, 84d supply, fill #3

## 2023-12-30 MED ORDER — POTASSIUM CHLORIDE CRYS ER 20 MEQ PO TBCR
40.0000 meq | EXTENDED_RELEASE_TABLET | Freq: Every day | ORAL | 3 refills | Status: DC
  Filled 2023-12-30 – 2024-03-05 (×2): qty 180, 90d supply, fill #0
  Filled 2024-08-02: qty 180, 90d supply, fill #1

## 2023-12-30 MED ORDER — APIXABAN 5 MG PO TABS
5.0000 mg | ORAL_TABLET | Freq: Two times a day (BID) | ORAL | 3 refills | Status: DC
  Filled 2023-12-30 – 2024-01-03 (×4): qty 180, 90d supply, fill #0
  Filled 2024-05-10: qty 60, 30d supply, fill #1
  Filled 2024-07-03: qty 60, 30d supply, fill #2
  Filled 2024-08-22: qty 60, 30d supply, fill #3
  Filled 2024-10-10: qty 60, 30d supply, fill #4

## 2023-12-30 MED ORDER — CARVEDILOL 6.25 MG PO TABS
6.2500 mg | ORAL_TABLET | Freq: Two times a day (BID) | ORAL | 3 refills | Status: DC
  Filled 2023-12-30: qty 180, 90d supply, fill #0

## 2023-12-30 NOTE — Telephone Encounter (Signed)
 Received a message:Hey! I am getting ready to work on the scripts for [place]. For the sensors, the [place] 3 is being phased out and replaced by the [place] 3 Plus. We don't have the [place] 3 sensors in stock so I was hoping we could change it so we can get the script ready for her.   Message is from the Pharmacist at [facility].   Requested Prescriptions   Signed Prescriptions Disp Refills   Continuous Glucose Sensor (FREESTYLE LIBRE 3 PLUS SENSOR) MISC 6 each 3    Sig: Inject 1 Device into the skin continuous. Change every 15 days    Authorizing Provider: TRIXIE FILE    Ordering User: CLEOTILDE ROLIN RAMAN

## 2023-12-31 ENCOUNTER — Other Ambulatory Visit: Payer: Self-pay | Admitting: Internal Medicine

## 2024-01-01 ENCOUNTER — Other Ambulatory Visit (HOSPITAL_COMMUNITY): Payer: Self-pay | Admitting: Internal Medicine

## 2024-01-03 ENCOUNTER — Ambulatory Visit (INDEPENDENT_AMBULATORY_CARE_PROVIDER_SITE_OTHER): Payer: PPO | Admitting: Nurse Practitioner

## 2024-01-03 ENCOUNTER — Encounter: Payer: Self-pay | Admitting: Nurse Practitioner

## 2024-01-03 ENCOUNTER — Other Ambulatory Visit (HOSPITAL_COMMUNITY): Payer: Self-pay

## 2024-01-03 ENCOUNTER — Other Ambulatory Visit: Payer: Self-pay

## 2024-01-03 ENCOUNTER — Other Ambulatory Visit (HOSPITAL_BASED_OUTPATIENT_CLINIC_OR_DEPARTMENT_OTHER): Payer: Self-pay

## 2024-01-03 ENCOUNTER — Ambulatory Visit (INDEPENDENT_AMBULATORY_CARE_PROVIDER_SITE_OTHER): Payer: PPO

## 2024-01-03 VITALS — BP 138/65 | HR 78 | Temp 96.6°F | Ht 62.0 in | Wt 149.2 lb

## 2024-01-03 DIAGNOSIS — G4734 Idiopathic sleep related nonobstructive alveolar hypoventilation: Secondary | ICD-10-CM | POA: Diagnosis not present

## 2024-01-03 DIAGNOSIS — J9611 Chronic respiratory failure with hypoxia: Secondary | ICD-10-CM

## 2024-01-03 DIAGNOSIS — J449 Chronic obstructive pulmonary disease, unspecified: Secondary | ICD-10-CM | POA: Diagnosis not present

## 2024-01-03 DIAGNOSIS — J189 Pneumonia, unspecified organism: Secondary | ICD-10-CM

## 2024-01-03 DIAGNOSIS — J432 Centrilobular emphysema: Secondary | ICD-10-CM

## 2024-01-03 DIAGNOSIS — I2510 Atherosclerotic heart disease of native coronary artery without angina pectoris: Secondary | ICD-10-CM | POA: Diagnosis not present

## 2024-01-03 DIAGNOSIS — I4811 Longstanding persistent atrial fibrillation: Secondary | ICD-10-CM

## 2024-01-03 DIAGNOSIS — J439 Emphysema, unspecified: Secondary | ICD-10-CM | POA: Diagnosis not present

## 2024-01-03 MED ORDER — STIOLTO RESPIMAT 2.5-2.5 MCG/ACT IN AERS: 2.0000 | INHALATION_SPRAY | Freq: Every day | RESPIRATORY_TRACT | Status: DC

## 2024-01-03 MED ORDER — ALBUTEROL SULFATE HFA 108 (90 BASE) MCG/ACT IN AERS
2.0000 | INHALATION_SPRAY | Freq: Four times a day (QID) | RESPIRATORY_TRACT | 2 refills | Status: AC | PRN
Start: 2024-01-03 — End: ?
  Filled 2024-01-03: qty 6.7, 25d supply, fill #0
  Filled 2024-06-21: qty 6.7, 25d supply, fill #1
  Filled 2024-10-19: qty 6.7, 25d supply, fill #2

## 2024-01-03 MED ORDER — STIOLTO RESPIMAT 2.5-2.5 MCG/ACT IN AERS
2.0000 | INHALATION_SPRAY | Freq: Every day | RESPIRATORY_TRACT | 11 refills | Status: AC
Start: 2024-01-03 — End: ?
  Filled 2024-01-03: qty 4, 28d supply, fill #0
  Filled 2024-04-16: qty 4, 30d supply, fill #0
  Filled 2024-08-22: qty 4, 30d supply, fill #1
  Filled 2024-10-10: qty 4, 30d supply, fill #2
  Filled 2024-11-07: qty 4, 30d supply, fill #3

## 2024-01-03 NOTE — Progress Notes (Signed)
 @Patient  ID: [name], female    DOB: 1931/09/03, 89 y.o.   MRN: [id number]  Chief Complaint  Patient presents with   Follow-up    Referring provider: Geofm Glade [name], MD  HPI: 89 year old female, former smoker followed for emphysema and chronic respiratory failure on supplemental O2. She is a former patient of Dr. Magdaleno and last seen in office 06/02/2023. Past medical history significant for PH, CHF, CAD, A fib on eliquis , HTN, HLD, DM on insulin , allergies.   TEST/EVENTS:  05/21/2014 PFT: FVC 110, FEV1 94, ratio 66, TLC 95, DLCOcor 39. No BD 06/22/2023 ONO on room air: 41 min 7 sec </88%, SpO2 low 81% 10/08/2023 CXR: bibasilar opacities concerning for potential pna or aspiration pneumonitis L>R; atherosclerosis, cardiomegaly  06/02/2023: OV with Dr. Shellia. OSA intolerant of CPAP. Chronic respiratory failure with SpO2 low 86% on room air during walk test. Recovered with 2 lpm O2. ONO ordered to determine supplemental O2 requirements at night. Depending on these results, could consider changing her to Inogen. COPD/emphysema with minimal symptoms. Doesn't feel she needs inhalers at this time.   01/03/2024: Today - follow up Patient presents today for follow up. She was hospitalized in October 2024 for AECOPD secondary to pna. She was treated with azithromycin , steroids and discharged home. She has felt better since then. She feels like she's back to her normal. She does get winded with longer distances. Feels like she may want to try an inhaler now. Doesn't have much of a cough. No wheezing that she notices. Denies any fevers, chills, hemoptysis, weight loss, leg swelling, chest pain, palpitations. She uses her oxygen  2-3 POC with activity. She sleeps with 3 lpm at night.   Allergies  Allergen Reactions   Levaquin  [Levofloxacin ] Shortness Of Breath and Swelling    angioedema   Lobster [Shellfish Allergy] Other (See Comments)    angioedema   Penicillins Rash    Has patient had a PCN  reaction causing immediate rash, facial/tongue/throat swelling, SOB or lightheadedness with hypotension: Yes Has patient had a PCN reaction causing severe rash involving mucus membranes or skin necrosis: No Has patient had a PCN reaction that required hospitalization: No Has patient had a PCN reaction occurring within the last 10 years: No If all of the above answers are NO, then may proceed with Cephalosporin use.   Valsartan Other (See Comments)    REACTION: angioedema   Codeine Other (See Comments)    Mental status changes   Lipitor [Atorvastatin ] Other (See Comments)    weakness   Oxycodone  Other (See Comments)    hallucinations   Statins     Made her too weak   Tramadol    Amlodipine  Besylate Other (See Comments)    REACTION: tingling in lips & gum edema 7/12: talked to patient, states she is tolerating well   Crestor  [Rosuvastatin  Calcium ] Rash   Tramadol Hcl Nausea And Vomiting    Immunization History  Administered Date(s) Administered   Fluad Quad(high Dose 65+) 08/22/2019, 10/07/2021, 10/01/2022   Fluad Trivalent(High Dose 65+) 10/11/2023   Influenza Split 11/16/2011, 09/26/2012   Influenza Whole 12/27/2004, 10/21/2008, 10/20/2009, 11/11/2010   Influenza, High Dose Seasonal PF 10/14/2016, 10/10/2017, 09/28/2018   Influenza,inj,Quad PF,6+ Mos 09/24/2013, 12/16/2014, 09/19/2015   Moderna Covid-19 Vaccine Bivalent Booster 6yrs & up 10/01/2022   Moderna SARS-COV2 Booster Vaccination 11/03/2020   Moderna Sars-Covid-2 Vaccination 02/08/2020, 03/10/2020   PPD Test 05/31/2015, 07/07/2016   Pneumococcal Conjugate-13 07/31/2018   Pneumococcal Polysaccharide-23 12/16/2014  Pneumococcal-Unspecified 05/31/2015   Td 06/15/2010    Past Medical History:  Diagnosis Date   Anemia    iron defficiency   Anginal pain (HCC)    Arthritis    knees, feet, hands; joints (05/27/2015)   Asthma    Atrial fibrillation or flutter    s/p RFCA 7/08;   s/p DCCV in past;   previously on  amiodarone ;  amio stopped due to lung toxicity   CAD (coronary artery disease)    s/p NSTEMI tx with BMS to OM1 3/08;  cath 3/08: pOM 99% tx with PCI, pLAD 20%, ? mod stenosis at the AM   CAP (community acquired pneumonia) 05/24/2015   CHF (congestive heart failure) (HCC)    Chronic diastolic heart failure (HCC)    echo 11/11:  EF 55-60%, severe LVH, mod LAE, mild MR, mildly increased PASP   COPD (chronic obstructive pulmonary disease) (HCC)    Degenerative joint disease    Dystrophy, corneal stromal    Heart murmur    HLD (hyperlipidemia)    HTN (hypertension)    essential nos   Hypopotassemia    PMH of   Muscle pain    Myocardial infarction (HCC) 2008   OSA on CPAP    Osteoporosis    Pneumonia 05/27/2015   Protein calorie malnutrition (HCC)    Rash and nonspecific skin eruption    both arms,awaiting bio   dr robinson   Seasonal allergies    Shortness of breath dyspnea    Type II diabetes mellitus (HCC)    Dr Kassie    Tobacco History: Social History   Tobacco Use  Smoking Status Former   Current packs/day: 0.00   Average packs/day: 0.3 packs/day for 18.0 years (4.5 ttl pk-yrs)   Types: Cigarettes   Start date: 12/27/1950   Quit date: 12/27/1968   Years since quitting: 55.0  Smokeless Tobacco Never  Tobacco Comments   smoked 1952- 1970, up to 1 pp week   Counseling given: Not Answered Tobacco comments: smoked 1952- 1970, up to 1 pp week   Outpatient Medications Prior to Visit  Medication Sig Dispense Refill   acetaminophen  (TYLENOL ) 325 MG tablet Take 2 tablets (650 mg total) by mouth every 6 (six) hours as needed for mild pain or headache. 20 tablet 0   amLODipine  (NORVASC ) 5 MG tablet TAKE 1 TABLET BY MOUTH DAILY 90 tablet 3   apixaban  (ELIQUIS ) 5 MG TABS tablet Take 1 tablet (5 mg total) by mouth 2 (two) times daily. 180 tablet 3   Blood Glucose Monitoring Suppl (ONETOUCH VERIO) w/Device KIT Use as instructed to check blood sugar 1 kit 0   carvedilol  (COREG ) 6.25 MG  tablet TAKE 1 TABLET BY MOUTH TWICE  DAILY WITH MEALS 180 tablet 3   Continuous Glucose Receiver (FREESTYLE LIBRE 3 READER) DEVI Use to check glucose continuously. 1 each 0   Continuous Glucose Sensor (FREESTYLE LIBRE 3 PLUS SENSOR) MISC Inject 1 Device into the skin continuous. Change every 15 days 6 each 3   fluticasone  (FLONASE ) 50 MCG/ACT nasal spray Place 1 spray into both nostrils daily as needed for allergies or rhinitis. 16 g 2   glucose blood (ONETOUCH VERIO) test strip CHECK BLOOD SUGAR TWICE DAILY 200 strip 3   insulin  aspart (NOVOLOG  FLEXPEN) 100 UNIT/ML FlexPen Inject 4-6 Units into the skin 3 (three) times daily before meals. 15 mL 3   Insulin  Pen Needle (BD PEN NEEDLE NANO 2ND GEN) 32G X 4 MM MISC Use 3x a  day as instructed 300 each 3   ipratropium-albuterol  (DUONEB) 0.5-2.5 (3) MG/3ML SOLN Take 3 mLs by nebulization every 6 (six) hours as needed. 360 mL 1   Lancet Devices (LANCING DEVICE) MISC Use as advised - for Delica Lancets 1 each 0   Lancets (ONETOUCH DELICA PLUS LANCET33G) MISC USE TO MONITOR GLUCOSE  LEVELS TWICE DAILY 200 each 3   Multiple Vitamins-Minerals (CENTRUM SILVER ULTRA WOMENS PO) Take 1 tablet by mouth daily.     nitroGLYCERIN  (NITROSTAT ) 0.4 MG SL tablet Place 1 tablet (0.4 mg total) under the tongue every 5 (five) minutes as needed for chest pain. 25 tablet 3   OXYGEN  Inhale 3 Doses into the lungs as needed. 3 liters     potassium chloride  SA (KLOR-CON  M) 20 MEQ tablet Take 2 tablets (40 mEq total) by mouth daily. 180 tablet 3   Semaglutide ,0.25 or 0.5MG /DOS, (OZEMPIC , 0.25 OR 0.5 MG/DOSE,) 2 MG/3ML SOPN INJECT SUBCUTANEOUSLY 0.25 MG  EVERY WEEK 3 mL 5   torsemide  (DEMADEX ) 20 MG tablet Take 2 tablets (40 mg total) by mouth 2 (two) times daily. 360 tablet 3   Vitamin D , Cholecalciferol , 25 MCG (1000 UT) CAPS Take 1 capsule by mouth daily.     No facility-administered medications prior to visit.     Review of Systems:   Constitutional: No weight loss or  gain, night sweats, fevers, chills, or lassitude. +fatigue (baseline) HEENT: No headaches, difficulty swallowing, tooth/dental problems, or sore throat. No sneezing, itching, ear ache, nasal congestion, or post nasal drip CV:  No chest pain, orthopnea, PND, swelling in lower extremities, anasarca, dizziness, palpitations, syncope Resp: +shortness of breath with exertion. No excess mucus or change in color of mucus. No productive or non-productive. No hemoptysis. No wheezing.  No chest wall deformity GI:  No heartburn, indigestion, abdominal pain, nausea, vomiting, diarrhea, change in bowel habits, loss of appetite, bloody stools.  GU: No dysuria, change in color of urine, urgency or frequency.   Skin: No rash, lesions, ulcerations MSK:  No joint pain or swelling.   Neuro: No dizziness or lightheadedness.  Psych: No depression or anxiety. Mood stable.     Physical Exam:  BP 138/65 (BP Location: Right Arm, Patient Position: Sitting, Cuff Size: Normal)   Pulse 78   Temp (!) 96.6 F (35.9 C) (Temporal)   Ht 5' 2 (1.575 m)   Wt 149 lb 3.2 oz (67.7 kg)   SpO2 91%   BMI 27.29 kg/m   GEN: Pleasant, interactive, well-kempt; in no acute distress HEENT:  Normocephalic and atraumatic. PERRLA. Sclera white. Nasal turbinates pink, moist and patent bilaterally. No rhinorrhea present. Oropharynx pink and moist, without exudate or edema. No lesions, ulcerations, or postnasal drip.  NECK:  Supple w/ fair ROM. No JVD present. Normal carotid impulses w/o bruits. Thyroid  symmetrical with no goiter or nodules palpated. No lymphadenopathy.   CV: Irregular rhythm, rate controlled, no m/r/g, no peripheral edema. Pulses intact, +2 bilaterally. No cyanosis, pallor or clubbing. PULMONARY:  Unlabored, regular breathing. Clear bilaterally A&P w/o wheezes/rales/rhonchi. No accessory muscle use.  GI: BS present and normoactive. Soft, non-tender to palpation. No organomegaly or masses detected.  MSK: No erythema,  warmth or tenderness. Cap refil <2 sec all extrem. No deformities or joint swelling noted.  Neuro: A/Ox3. No focal deficits noted.   Skin: Warm, no lesions or rashe Psych: Normal affect and behavior. Judgement and thought content appropriate.     Lab Results:  CBC    Component Value Date/Time  WBC 16.0 (H) 10/11/2023 0436   RBC 4.34 10/11/2023 0436   HGB 12.9 10/11/2023 0436   HCT 41.2 10/11/2023 0436   PLT 164 10/11/2023 0436   MCV 94.9 10/11/2023 0436   MCH 29.7 10/11/2023 0436   MCHC 31.3 10/11/2023 0436   RDW 14.2 10/11/2023 0436   LYMPHSABS 0.8 10/08/2023 0927   MONOABS 0.4 10/08/2023 0927   EOSABS 0.3 10/08/2023 0927   BASOSABS 0.0 10/08/2023 0927    BMET    Component Value Date/Time   NA 139 10/08/2023 0927   NA 138 07/02/2016 0000   NA 138 07/02/2016 0000   K 4.4 10/08/2023 0927   CL 97 (L) 10/08/2023 0927   CO2 35 (H) 10/08/2023 0927   GLUCOSE 187 (H) 10/08/2023 0927   BUN 25 (H) 10/08/2023 0927   BUN 25 (A) 07/02/2016 0000   BUN 25 (A) 07/02/2016 0000   CREATININE 0.86 10/08/2023 0927   CALCIUM  10.4 (H) 10/08/2023 0927   GFRNONAA >60 10/08/2023 0927   GFRAA 46 (L) 06/09/2020 1133    BNP    Component Value Date/Time   BNP 222.7 (H) 10/08/2023 0927     Imaging:  No results found.  Administration History     None          Latest Ref Rng & Units 05/21/2014   10:58 AM  PFT Results  FVC-Pre L 2.10   FVC-Predicted Pre % 110   FVC-Post L 2.09   FVC-Predicted Post % 110   Pre FEV1/FVC % % 67   Post FEV1/FCV % % 66   FEV1-Pre L 1.40   FEV1-Predicted Pre % 95   FEV1-Post L 1.37   DLCO uncorrected ml/min/mmHg 8.97   DLCO UNC% % 39   DLCO corrected ml/min/mmHg 8.97   DLCO COR %Predicted % 39   DLVA Predicted % 54   TLC L 4.69   TLC % Predicted % 95   RV % Predicted % 117     No results found for: NITRICOXIDE      Assessment & Plan:   COPD GOLD I  Mild obstructive lung disease. DOE with longer distances. Recent AECOPD  secondary to pna requiring hospitalization October 2024. Will start on controller regimen with Stiolto. Teachback performed. Side effect profile reviewed. Provided with samples and new rx placed. Action plan in place.  Patient Instructions  -Start Stiolto 2 puffs daily. This is your new maintenance inhaler that you will use daily regardless of symptoms -Albuterol  inhaler 2 puffs or 3 mL neb every 6 hours as needed for shortness of breath or wheezing. This is a rescue inhaler and you only need to use these if you are having trouble with your breathing -Continue flonase  nasal spray 2 sprays each nostril daily  -Continue supplemental oxygen  3 lpm with activity and at night for goal oxygen  levels greater than 88-90%  We will have you complete an overnight oxygen  study on 3 lpm supplemental oxygen . They will mail this to your house   Repeat chest x ray today   Follow up in 6 weeks with Dr. Kara (30 min new pt slot) or [name] Zadia Uhde,[name]. If symptoms do not improve or worsen, please contact office for sooner follow up or seek emergency care.    Chronic respiratory failure with hypoxia (HCC) Stable on current regimen without increased O2 requirement. Able to maintain saturations >88-90% at rest on RA. Wearing supplemental O2 at night and with exertion. Advised to monitor for goal >88-90%. Plan to repeat ONO on  3 lpm to ensure hypoxia corrected.   CAP (community acquired pneumonia) Hospitalized 09/2023. Clinically improved. Repeat CXR today to ensure resolution.   ATRIAL FIBRILLATION PAF on chronic AC. Rate controlled. Follow up with cardiology as scheduled   Advised if symptoms do not improve or worsen, to please contact office for sooner follow up or seek emergency care.   I spent 35 minutes of dedicated to the care of this patient on the date of this encounter to include pre-visit review of records, face-to-face time with the patient discussing conditions above, post visit ordering of testing,  clinical documentation with the electronic health record, making appropriate referrals as documented, and communicating necessary findings to members of the patients care team.  [name] LULLA Rouleau, [name] 01/04/2024  Pt aware and understands [name]'s role.

## 2024-01-03 NOTE — Patient Instructions (Addendum)
-  Start Stiolto 2 puffs daily. This is your new maintenance inhaler that you will use daily regardless of symptoms -Albuterol  inhaler 2 puffs or 3 mL neb every 6 hours as needed for shortness of breath or wheezing. This is a rescue inhaler and you only need to use these if you are having trouble with your breathing -Continue flonase  nasal spray 2 sprays each nostril daily  -Continue supplemental oxygen  3 lpm with activity and at night for goal oxygen  levels greater than 88-90%  We will have you complete an overnight oxygen  study on 3 lpm supplemental oxygen . They will mail this to your house   Repeat chest x ray today   Follow up in 6 weeks with Dr. Kara (30 min new pt slot) or Katie Macsen Nuttall,NP. If symptoms do not improve or worsen, please contact office for sooner follow up or seek emergency care.

## 2024-01-04 ENCOUNTER — Encounter: Payer: Self-pay | Admitting: Nurse Practitioner

## 2024-01-04 DIAGNOSIS — M62512 Muscle wasting and atrophy, not elsewhere classified, left shoulder: Secondary | ICD-10-CM | POA: Diagnosis not present

## 2024-01-04 DIAGNOSIS — M62511 Muscle wasting and atrophy, not elsewhere classified, right shoulder: Secondary | ICD-10-CM | POA: Diagnosis not present

## 2024-01-04 DIAGNOSIS — R278 Other lack of coordination: Secondary | ICD-10-CM | POA: Diagnosis not present

## 2024-01-04 NOTE — Assessment & Plan Note (Signed)
 Hospitalized 09/2023. Clinically improved. Repeat CXR today to ensure resolution.

## 2024-01-04 NOTE — Assessment & Plan Note (Signed)
 PAF on chronic AC. Rate controlled. Follow up with cardiology as scheduled

## 2024-01-04 NOTE — Assessment & Plan Note (Signed)
 Stable on current regimen without increased O2 requirement. Able to maintain saturations >88-90% at rest on RA. Wearing supplemental O2 at night and with exertion. Advised to monitor for goal >88-90%. Plan to repeat ONO on 3 lpm to ensure hypoxia corrected

## 2024-01-04 NOTE — Assessment & Plan Note (Signed)
 Mild obstructive lung disease. DOE with longer distances. Recent AECOPD secondary to pna requiring hospitalization October 2024. Will start on controller regimen with Stiolto. Teachback performed. Side effect profile reviewed. Provided with samples and new rx placed. Action plan in place.  Patient Instructions  -Start Stiolto 2 puffs daily. This is your new maintenance inhaler that you will use daily regardless of symptoms -Albuterol  inhaler 2 puffs or 3 mL neb every 6 hours as needed for shortness of breath or wheezing. This is a rescue inhaler and you only need to use these if you are having trouble with your breathing -Continue flonase  nasal spray 2 sprays each nostril daily  -Continue supplemental oxygen  3 lpm with activity and at night for goal oxygen  levels greater than 88-90%  We will have you complete an overnight oxygen  study on 3 lpm supplemental oxygen . They will mail this to your house   Repeat chest x ray today   Follow up in 6 weeks with Dr. Kara (30 min new pt slot) or Katie Keilan Nichol,NP. If symptoms do not improve or worsen, please contact office for sooner follow up or seek emergency care.

## 2024-01-05 ENCOUNTER — Other Ambulatory Visit: Payer: Self-pay

## 2024-01-05 ENCOUNTER — Other Ambulatory Visit (HOSPITAL_BASED_OUTPATIENT_CLINIC_OR_DEPARTMENT_OTHER): Payer: Self-pay

## 2024-01-09 ENCOUNTER — Other Ambulatory Visit: Payer: Self-pay

## 2024-01-09 ENCOUNTER — Telehealth: Payer: Self-pay | Admitting: Nurse Practitioner

## 2024-01-09 ENCOUNTER — Other Ambulatory Visit (HOSPITAL_COMMUNITY): Payer: Self-pay

## 2024-01-09 MED ORDER — FLUTICASONE PROPIONATE 50 MCG/ACT NA SUSP
1.0000 | Freq: Every day | NASAL | 3 refills | Status: DC | PRN
  Filled 2024-01-09: qty 16, 60d supply, fill #0
  Filled 2024-03-13: qty 16, 60d supply, fill #1
  Filled 2024-04-16 – 2024-04-27 (×2): qty 16, 60d supply, fill #2
  Filled 2024-06-01 – 2024-06-21 (×2): qty 16, 60d supply, fill #3

## 2024-01-09 NOTE — Telephone Encounter (Signed)
 [facility] needs a prescription for patients Flonase Nasal Spray.

## 2024-01-09 NOTE — Telephone Encounter (Signed)
 Rx for pt's flonase has been sent to [facility] for pt.

## 2024-01-10 ENCOUNTER — Other Ambulatory Visit (HOSPITAL_COMMUNITY): Payer: Self-pay

## 2024-01-10 ENCOUNTER — Other Ambulatory Visit: Payer: Self-pay | Admitting: Internal Medicine

## 2024-01-10 MED FILL — "Insulin Pen Needle 32 G X 4 MM (1/6"" or 5/32"")": 100 days supply | Qty: 300 | Fill #0 | Status: AC

## 2024-01-10 NOTE — Telephone Encounter (Signed)
 duplicate

## 2024-01-11 ENCOUNTER — Other Ambulatory Visit: Payer: Self-pay

## 2024-01-13 DIAGNOSIS — R278 Other lack of coordination: Secondary | ICD-10-CM | POA: Diagnosis not present

## 2024-01-13 DIAGNOSIS — M62511 Muscle wasting and atrophy, not elsewhere classified, right shoulder: Secondary | ICD-10-CM | POA: Diagnosis not present

## 2024-01-13 DIAGNOSIS — M62512 Muscle wasting and atrophy, not elsewhere classified, left shoulder: Secondary | ICD-10-CM | POA: Diagnosis not present

## 2024-01-16 ENCOUNTER — Other Ambulatory Visit (HOSPITAL_COMMUNITY): Payer: Self-pay

## 2024-01-16 ENCOUNTER — Telehealth: Payer: Self-pay

## 2024-01-16 DIAGNOSIS — M62512 Muscle wasting and atrophy, not elsewhere classified, left shoulder: Secondary | ICD-10-CM | POA: Diagnosis not present

## 2024-01-16 DIAGNOSIS — M62511 Muscle wasting and atrophy, not elsewhere classified, right shoulder: Secondary | ICD-10-CM | POA: Diagnosis not present

## 2024-01-16 DIAGNOSIS — R278 Other lack of coordination: Secondary | ICD-10-CM | POA: Diagnosis not present

## 2024-01-16 MED ORDER — OZEMPIC (0.25 OR 0.5 MG/DOSE) 2 MG/3ML ~~LOC~~ SOPN
0.5000 mg | PEN_INJECTOR | SUBCUTANEOUS | 3 refills | Status: DC
  Filled 2024-01-16: qty 3, 28d supply, fill #0
  Filled 2024-02-21: qty 3, 28d supply, fill #1
  Filled 2024-03-13 – 2024-04-05 (×3): qty 3, 28d supply, fill #2
  Filled 2024-05-18: qty 3, 28d supply, fill #3
  Filled 2024-07-16: qty 3, 28d supply, fill #4
  Filled 2024-08-22: qty 3, 28d supply, fill #5
  Filled 2024-09-14: qty 3, 28d supply, fill #6
  Filled 2024-11-14: qty 3, 28d supply, fill #7
  Filled 2024-12-03 – 2024-12-05 (×2): qty 3, 28d supply, fill #8
  Filled 2025-01-10: qty 3, 28d supply, fill #9

## 2024-01-16 NOTE — Telephone Encounter (Signed)
Requested Prescriptions   Signed Prescriptions Disp Refills   Semaglutide,0.25 or 0.5MG /DOS, (OZEMPIC, 0.25 OR 0.5 MG/DOSE,) 2 MG/3ML SOPN 9 mL 3    Sig: Inject 0.5 mg into the skin once a week.    Authorizing Provider: Carlus Pavlov    Ordering User: Pollie Meyer

## 2024-01-19 DIAGNOSIS — M62512 Muscle wasting and atrophy, not elsewhere classified, left shoulder: Secondary | ICD-10-CM | POA: Diagnosis not present

## 2024-01-19 DIAGNOSIS — M62511 Muscle wasting and atrophy, not elsewhere classified, right shoulder: Secondary | ICD-10-CM | POA: Diagnosis not present

## 2024-01-19 DIAGNOSIS — R278 Other lack of coordination: Secondary | ICD-10-CM | POA: Diagnosis not present

## 2024-01-25 DIAGNOSIS — M62511 Muscle wasting and atrophy, not elsewhere classified, right shoulder: Secondary | ICD-10-CM | POA: Diagnosis not present

## 2024-01-25 DIAGNOSIS — M62512 Muscle wasting and atrophy, not elsewhere classified, left shoulder: Secondary | ICD-10-CM | POA: Diagnosis not present

## 2024-01-25 DIAGNOSIS — R278 Other lack of coordination: Secondary | ICD-10-CM | POA: Diagnosis not present

## 2024-01-26 ENCOUNTER — Telehealth: Payer: Self-pay

## 2024-01-26 NOTE — Telephone Encounter (Signed)
Spoke with Corrie Dandy and questions answered.

## 2024-01-26 NOTE — Telephone Encounter (Signed)
Copied from CRM [id number]. Topic: General - Other >> Jan 25, 2024  4:10 PM Irine Seal wrote: Reason for CRM:  [name] with [employer] checking on the status of a chronic condition verification form that was faxed over multiple times this month, confirmed fax #  was correct please call glenna back at  [phone], she states she has resent it a few times and have not heard back

## 2024-01-30 DIAGNOSIS — M62512 Muscle wasting and atrophy, not elsewhere classified, left shoulder: Secondary | ICD-10-CM | POA: Diagnosis not present

## 2024-01-30 DIAGNOSIS — R278 Other lack of coordination: Secondary | ICD-10-CM | POA: Diagnosis not present

## 2024-01-30 DIAGNOSIS — M62511 Muscle wasting and atrophy, not elsewhere classified, right shoulder: Secondary | ICD-10-CM | POA: Diagnosis not present

## 2024-02-01 DIAGNOSIS — R278 Other lack of coordination: Secondary | ICD-10-CM | POA: Diagnosis not present

## 2024-02-01 DIAGNOSIS — M62511 Muscle wasting and atrophy, not elsewhere classified, right shoulder: Secondary | ICD-10-CM | POA: Diagnosis not present

## 2024-02-01 DIAGNOSIS — M62512 Muscle wasting and atrophy, not elsewhere classified, left shoulder: Secondary | ICD-10-CM | POA: Diagnosis not present

## 2024-02-06 DIAGNOSIS — M62512 Muscle wasting and atrophy, not elsewhere classified, left shoulder: Secondary | ICD-10-CM | POA: Diagnosis not present

## 2024-02-06 DIAGNOSIS — R278 Other lack of coordination: Secondary | ICD-10-CM | POA: Diagnosis not present

## 2024-02-06 DIAGNOSIS — M62511 Muscle wasting and atrophy, not elsewhere classified, right shoulder: Secondary | ICD-10-CM | POA: Diagnosis not present

## 2024-02-08 DIAGNOSIS — R278 Other lack of coordination: Secondary | ICD-10-CM | POA: Diagnosis not present

## 2024-02-08 DIAGNOSIS — M62512 Muscle wasting and atrophy, not elsewhere classified, left shoulder: Secondary | ICD-10-CM | POA: Diagnosis not present

## 2024-02-08 DIAGNOSIS — M62511 Muscle wasting and atrophy, not elsewhere classified, right shoulder: Secondary | ICD-10-CM | POA: Diagnosis not present

## 2024-02-13 ENCOUNTER — Other Ambulatory Visit: Payer: Self-pay

## 2024-02-13 ENCOUNTER — Other Ambulatory Visit (HOSPITAL_BASED_OUTPATIENT_CLINIC_OR_DEPARTMENT_OTHER): Payer: Self-pay

## 2024-02-13 DIAGNOSIS — M62511 Muscle wasting and atrophy, not elsewhere classified, right shoulder: Secondary | ICD-10-CM | POA: Diagnosis not present

## 2024-02-13 DIAGNOSIS — R278 Other lack of coordination: Secondary | ICD-10-CM | POA: Diagnosis not present

## 2024-02-13 DIAGNOSIS — M62512 Muscle wasting and atrophy, not elsewhere classified, left shoulder: Secondary | ICD-10-CM | POA: Diagnosis not present

## 2024-02-17 ENCOUNTER — Ambulatory Visit: Payer: Medicare Other | Admitting: Pulmonary Disease

## 2024-02-21 ENCOUNTER — Other Ambulatory Visit (HOSPITAL_COMMUNITY): Payer: Self-pay

## 2024-02-28 ENCOUNTER — Other Ambulatory Visit (HOSPITAL_BASED_OUTPATIENT_CLINIC_OR_DEPARTMENT_OTHER): Payer: Self-pay

## 2024-02-28 ENCOUNTER — Other Ambulatory Visit (HOSPITAL_COMMUNITY): Payer: Self-pay

## 2024-02-28 ENCOUNTER — Other Ambulatory Visit (HOSPITAL_COMMUNITY): Payer: Self-pay | Admitting: Pharmacist

## 2024-02-28 ENCOUNTER — Other Ambulatory Visit: Payer: Self-pay

## 2024-02-28 ENCOUNTER — Other Ambulatory Visit (HOSPITAL_COMMUNITY): Payer: Self-pay | Admitting: Cardiology

## 2024-02-28 MED ORDER — CARVEDILOL 6.25 MG PO TABS
6.2500 mg | ORAL_TABLET | Freq: Two times a day (BID) | ORAL | 3 refills | Status: DC
  Filled 2024-02-28: qty 180, 90d supply, fill #0

## 2024-02-28 MED ORDER — CARVEDILOL 6.25 MG PO TABS
6.2500 mg | ORAL_TABLET | Freq: Two times a day (BID) | ORAL | 3 refills | Status: AC
  Filled 2024-02-28: qty 180, 90d supply, fill #0
  Filled 2024-06-01: qty 180, 90d supply, fill #1
  Filled 2024-09-14: qty 180, 90d supply, fill #2
  Filled 2025-01-10: qty 180, 90d supply, fill #3

## 2024-02-28 MED ORDER — AMLODIPINE BESYLATE 5 MG PO TABS
5.0000 mg | ORAL_TABLET | Freq: Every day | ORAL | 3 refills | Status: AC
  Filled 2024-02-28: qty 90, 90d supply, fill #0
  Filled 2024-06-01: qty 90, 90d supply, fill #1
  Filled 2024-09-14: qty 90, 90d supply, fill #2
  Filled 2025-01-10: qty 90, 90d supply, fill #3

## 2024-03-01 ENCOUNTER — Ambulatory Visit: Payer: Medicare Other | Admitting: Internal Medicine

## 2024-03-01 NOTE — Progress Notes (Deleted)
 Patient ID: [name], female   DOB: 1931/01/25, 89 y.o.   MRN: [id number]  HPI: [name] is a 89 y.o.-year-old female, self-referred (her daughter is also my patient), for management of DM2, dx in 1994, insulin-dependent, uncontrolled, with complications (CAD, s/p NSTEMI 2008; CHF; PAD; atrial fibrillation/atrial flutter; peripheral neuropathy; CKD stage IIIa).   Last visit with me 5 months ago.  Patient's daughter accompanies her today and offers information about patient's diet, activity, and insulin doses.  Interim history: No increased urination, blurry vision, nausea, chest pain.    Reviewed HbA1c: Lab Results  Component Value Date   HGBA1C 7.5 (H) 10/08/2023   HGBA1C 6.9 06/23/2023   HGBA1C 7.3 (A) 02/03/2023   HGBA1C 7.0 (A) 07/29/2022   HGBA1C 8.4 (A) 03/26/2022   HGBA1C 7.3 (A) 12/25/2021   HGBA1C 9.0 (A) 09/14/2021   HGBA1C 8.3 (A) 05/28/2021   HGBA1C 8.0 (A) 03/24/2021   HGBA1C 9.5 (A) 12/02/2020   HGBA1C 7.8 (A) 08/21/2020   HGBA1C 8.3 (H) 05/15/2020   HGBA1C 8.0 (A) 05/06/2020   HGBA1C 9.2 (H) 02/20/2020   HGBA1C 7.9 (A) 11/14/2019   HGBA1C 7.6 (A) 08/22/2019   HGBA1C 8.1 (A) 05/01/2019   HGBA1C 8.9 (H) 01/31/2019   HGBA1C 7.0 (A) 12/06/2018   HGBA1C 6.5 (A) 09/20/2018   HGBA1C 6.4 (A) 07/19/2018   HGBA1C 7.3 05/08/2018   HGBA1C 8.0 (H) 01/30/2018   HGBA1C 8.3 11/23/2017   HGBA1C 7.4 08/23/2017   HGBA1C 8.2 01/24/2017   HGBA1C 7.7 09/08/2016   HGBA1C 8.4 (H) 06/09/2016   HGBA1C 7.8 (H) 03/04/2016   HGBA1C 7.6 12/08/2015   HGBA1C 8.3 08/26/2015   HGBA1C 7.9 (H) 05/24/2015   HGBA1C 8.6 (H) 02/06/2015   HGBA1C 8.7 (H) 11/15/2014   HGBA1C 8.1 (H) 08/21/2014   HGBA1C 9.6 (H) 01/21/2014   HGBA1C 9.8 (H) 09/13/2013   HGBA1C 9.7 (H) 05/15/2013   HGBA1C 10.9 (H) 02/01/2013   HGBA1C 8.9 (H) 09/26/2012   She is on: - Ozempic 0.5 >> 0.25 mg weekly 2/2 weight loss >> 0.5 mg weekly - NovoLog 4-6 units 3x a day 10-15 min before meals >> Before  b'fast: 4-6 units  Before lunch: 8-10 units She previously was on Lantus 15 units daily at night but stopped due to low blood sugars 06/2023.  Pt checks her sugars >4x a day and they are:  Previously:  Prev.: - am:  89-158, 164, 186 >> 92-170 >> 96-130, 188 - 2h after b'fast: n/c >> 154 >> 172 >> n/c - before lunch: 248 >> 189 >> 94-179 >> 90-130s - 2h after lunch: n/c  >> 148 >> 45 - before dinner: 78-199  >> 116-140, 216 >> 90-130s - 2h after dinner: 171, 198 >> n/c >> 165 >> 200's - bedtime: 6110-209, 274, 497 >> 130, 389, 497 >> n/c - nighttime: n/c >> 93-227 >> 186-286 >>  Lowest sugar was 59 >> 92 >> 45 >> 69; she has hypoglycemia awareness at 70.  Highest sugar was 497 (steroids) >> 250 >> >200.  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  Pt's meals are: - Breakfast: frozen banana, berries, oatmeal, protein powder >> yogurt and fruit - dinner: Includes soup  But she has lunch and dinner in the cafeteria [facility] - facility: [place]):   -+ History of CKD stage IIIa, last BUN/creatinine:  Lab Results  Component Value Date   BUN 25 (H) 10/08/2023   BUN 26 (H) 08/30/2023   CREATININE 0.86 10/08/2023  CREATININE 1.13 08/30/2023   Lab Results  Component Value Date   MICRALBCREAT 14.7 07/31/2018   MICRALBCREAT 4.4 09/08/2016   MICRALBCREAT 25.0 02/06/2015   MICRALBCREAT 1.7 05/15/2013   MICRALBCREAT 10.6 09/26/2012   MICRALBCREAT 6.9 05/29/2012   MICRALBCREAT 1.7 07/13/2011   MICRALBCREAT 7.3 10/28/2009   MICRALBCREAT 10.3 01/25/2008   MICRALBCREAT 31.5 (H) 10/13/2007  She is not on an ACE inhibitor/ARB  -+ HL; last set of lipids: Lab Results  Component Value Date   CHOL 315 (H) 01/18/2023   HDL 54.10 01/18/2023   LDLCALC 229 (H) 01/18/2023   LDLDIRECT 144.6 02/03/2010   TRIG 161.0 (H) 01/18/2023   CHOLHDL 6 01/18/2023  She had muscle weakness from statins.   - last eye exam was in 2023 (?): No DR reportedly.  - no numbness and tingling in  her feet.  She saw podiatry, but not recently.  Last foot exam 02/15/2023.  Pt has FH of DM in mother.  She also has a history of HTN, osteoporosis, protein-calorie malnutrition, iron deficiency anemia. No FH of MTC, no pancreatitis hx.  ROS: + See HPI  Past Medical History:  Diagnosis Date   Anemia    iron defficiency   Anginal pain (HCC)    Arthritis    "knees, feet, hands; joints" (05/27/2015)   Asthma    Atrial fibrillation or flutter    s/p RFCA 7/08;   s/p DCCV in past;   previously on amiodarone;  amio stopped due to lung toxicity   CAD (coronary artery disease)    s/p NSTEMI tx with BMS to OM1 3/08;  cath 3/08: pOM 99% tx with PCI, pLAD 20%, ? mod stenosis at the AM   CAP (community acquired pneumonia) 05/24/2015   CHF (congestive heart failure) (HCC)    Chronic diastolic heart failure (HCC)    echo 11/11:  EF 55-60%, severe LVH, mod LAE, mild MR, mildly increased PASP   COPD (chronic obstructive pulmonary disease) (HCC)    Degenerative joint disease    Dystrophy, corneal stromal    Heart murmur    HLD (hyperlipidemia)    HTN (hypertension)    essential nos   Hypopotassemia    PMH of   Muscle pain    Myocardial infarction (HCC) 2008   OSA on CPAP    Osteoporosis    Pneumonia 05/27/2015   Protein calorie malnutrition (HCC)    Rash and nonspecific skin eruption    both arms,awaiting bio   dr Emily Filbert   Seasonal allergies    Shortness of breath dyspnea    Type II diabetes mellitus (HCC)    Dr Everardo All   Past Surgical History:  Procedure Laterality Date   A FLUTTER ABLATION     Dr Ladona Ridgel   ABDOMINAL AORTOGRAM W/LOWER EXTREMITY Bilateral 04/11/2020   Procedure: ABDOMINAL AORTOGRAM W/LOWER EXTREMITY;  Surgeon: Chuck Hint, MD;  Location[facility] INVASIVE CV LAB;  Service: Cardiovascular;  Laterality: Bilateral;   CATARACT EXTRACTION W/ INTRAOCULAR LENS  IMPLANT, BILATERAL Bilateral    CHOLECYSTECTOMY     COLONOSCOPY  6/07   2 polyps Dr. Elvina Mattes in [place]    colonoscopy with polypectomy      X 2; [facility]   CORNEAL TRANSPLANT Bilateral    CORONARY ANGIOPLASTY WITH STENT PLACEMENT  02/2007   BMS w/Dr Ladona Ridgel   CORONARY ATHERECTOMY N/A 05/16/2020   Procedure: CORONARY ATHERECTOMY;  Surgeon: Swaziland, Peter M, MD;  Location: [facility] INVASIVE CV LAB;  Service: Cardiovascular;  Laterality: N/A;  CORONARY BALLOON ANGIOPLASTY N/A 05/16/2020   Procedure: CORONARY BALLOON ANGIOPLASTY;  Surgeon: Swaziland, Peter M, MD;  Location: [facility] INVASIVE CV LAB;  Service: Cardiovascular;  Laterality: N/A;   CORONARY STENT INTERVENTION N/A 05/16/2020   Procedure: CORONARY STENT INTERVENTION;  Surgeon: Swaziland, Peter M, MD;  Location: [facility] INVASIVE CV LAB;  Service: Cardiovascular;  Laterality: N/A;   CORONARY ULTRASOUND/IVUS N/A 05/16/2020   Procedure: Intravascular Ultrasound/IVUS;  Surgeon: Swaziland, Peter M, MD;  Location: [facility] INVASIVE CV LAB;  Service: Cardiovascular;  Laterality: N/A;   EYE SURGERY     gravid 2 para 2     KNEE CARTILAGE SURGERY Right    LEFT AND RIGHT HEART CATHETERIZATION WITH CORONARY ANGIOGRAM N/A 05/07/2014   Procedure: LEFT AND RIGHT HEART CATHETERIZATION WITH CORONARY ANGIOGRAM;  Surgeon: Peter M Swaziland, MD;  Location: [facility] CATH LAB;  Service: Cardiovascular;  Laterality: N/A;   PERIPHERAL VASCULAR INTERVENTION  04/11/2020   Procedure: PERIPHERAL VASCULAR INTERVENTION;  Surgeon: Chuck Hint, MD;  Location: [facility] INVASIVE CV LAB;  Service: Cardiovascular;;   RIGHT HEART CATH N/A 02/04/2021   Procedure: RIGHT HEART CATH;  Surgeon: Dolores Patty, MD;  Location: [facility] INVASIVE CV LAB;  Service: Cardiovascular;  Laterality: N/A;   RIGHT/LEFT HEART CATH AND CORONARY ANGIOGRAPHY N/A 05/16/2020   Procedure: RIGHT/LEFT HEART CATH AND CORONARY ANGIOGRAPHY;  Surgeon: Dolores Patty, MD;  Location: [facility] INVASIVE CV LAB;  Service: Cardiovascular;  Laterality: N/A;   TOTAL KNEE ARTHROPLASTY Right 06/28/2016   Procedure: TOTAL KNEE ARTHROPLASTY;  Surgeon: Gean Birchwood,  MD;  Location: [facility] OR;  Service: Orthopedics;  Laterality: Right;   Social History   Socioeconomic History   Marital status: Widowed    Spouse name: Not on file   Number of children: 2   Years of education: 16   Highest education level: Bachelor's degree (e.g., BA, AB, BS)  Occupational History   Occupation: Teacher/ Retired  Tobacco Use   Smoking status: Former    Current packs/day: 0.00    Average packs/day: 0.3 packs/day for 18.0 years (4.5 ttl pk-yrs)    Types: Cigarettes    Start date: 12/27/1950    Quit date: 12/27/1968    Years since quitting: 55.2   Smokeless tobacco: Never   Tobacco comments:    smoked 1952- 1970, up to 1 pp week  Vaping Use   Vaping status: Never Used  Substance and Sexual Activity   Alcohol use: Yes    Comment: occ   Drug use: No   Sexual activity: Never  Other Topics Concern   Not on file  Social History Narrative   Teacher. Widowed. Rarely drinks cafeine.    Social Drivers of Corporate investment banker Strain: Low Risk  (09/08/2023)   Overall Financial Resource Strain (CARDIA)    Difficulty of Paying Living Expenses: Not hard at all  Food Insecurity: No Food Insecurity (10/14/2023)   Hunger Vital Sign    Worried About Running Out of Food in the Last Year: Never true    Ran Out of Food in the Last Year: Never true  Transportation Needs: No Transportation Needs (10/14/2023)   PRAPARE - Administrator, Civil Service (Medical): No    Lack of Transportation (Non-Medical): No  Physical Activity: Sufficiently Active (09/08/2023)   Exercise Vital Sign    Days of Exercise per Week: 5 days    Minutes of Exercise per Session: 30 min  Stress: No Stress Concern Present (09/08/2023)   [employer] of Occupational Health -  Occupational Stress Questionnaire    Feeling of Stress : Not at all  Social Connections: Socially Integrated (09/08/2023)   Social Connection and Isolation Panel [NHANES]    Frequency of Communication with Friends and  Family: More than three times a week    Frequency of Social Gatherings with Friends and Family: More than three times a week    Attends Religious Services: More than 4 times per year    Active Member of [employer] or Organizations: Yes    Attends Engineer, structural: More than 4 times per year    Marital Status: Married  Catering manager Violence: Not At Risk (10/08/2023)   Humiliation, Afraid, Rape, and Kick questionnaire    Fear of Current or Ex-Partner: No    Emotionally Abused: No    Physically Abused: No    Sexually Abused: No   Current Outpatient Medications on File Prior to Visit  Medication Sig Dispense Refill   acetaminophen (TYLENOL) 325 MG tablet Take 2 tablets (650 mg total) by mouth every 6 (six) hours as needed for mild pain or headache. 20 tablet 0   albuterol (VENTOLIN HFA) 108 (90 Base) MCG/ACT inhaler Inhale 2 puffs into the lungs every 6 (six) hours as needed for wheezing or shortness of breath. 6.7 g 2   amLODipine (NORVASC) 5 MG tablet TAKE 1 TABLET BY MOUTH DAILY 90 tablet 3   apixaban (ELIQUIS) 5 MG TABS tablet Take 1 tablet (5 mg total) by mouth 2 (two) times daily. 180 tablet 3   Blood Glucose Monitoring Suppl (ONETOUCH VERIO) w/Device KIT Use as instructed to check blood sugar 1 kit 0   carvedilol (COREG) 6.25 MG tablet Take 1 tablet (6.25 mg total) by mouth 2 (two) times daily with a meal. 180 tablet 3   Continuous Glucose Receiver (FREESTYLE LIBRE 3 READER) DEVI Use to check glucose continuously. 1 each 0   Continuous Glucose Sensor (FREESTYLE LIBRE 3 PLUS SENSOR) MISC Inject 1 Device into the skin continuous. Change every 15 days 6 each 3   fluticasone (FLONASE) 50 MCG/ACT nasal spray Place 1 spray into both nostrils daily as needed for allergies or rhinitis. 16 g 3   glucose blood (ONETOUCH VERIO) test strip CHECK BLOOD SUGAR TWICE DAILY 200 strip 3   insulin aspart (NOVOLOG FLEXPEN) 100 UNIT/ML FlexPen Inject 4-6 Units into the skin 3 (three) times daily  before meals. 15 mL 3   Insulin Pen Needle (BD PEN NEEDLE NANO 2ND GEN) 32G X 4 MM MISC Use as directed 3 (three) times daily. 300 each 3   ipratropium-albuterol (DUONEB) 0.5-2.5 (3) MG/3ML SOLN Take 3 mLs by nebulization every 6 (six) hours as needed. 360 mL 1   Lancet Devices (LANCING DEVICE) MISC Use as advised - for Delica Lancets 1 each 0   Lancets (ONETOUCH DELICA PLUS LANCET33G) MISC USE TO MONITOR GLUCOSE  LEVELS TWICE DAILY 200 each 3   Multiple Vitamins-Minerals (CENTRUM SILVER ULTRA WOMENS PO) Take 1 tablet by mouth daily.     nitroGLYCERIN (NITROSTAT) 0.4 MG SL tablet Place 1 tablet (0.4 mg total) under the tongue every 5 (five) minutes as needed for chest pain. 25 tablet 3   OXYGEN Inhale 3 Doses into the lungs as needed. 3 liters     potassium chloride SA (KLOR-CON M) 20 MEQ tablet Take 2 tablets (40 mEq total) by mouth daily. 180 tablet 3   Semaglutide,0.25 or 0.5MG /DOS, (OZEMPIC, 0.25 OR 0.5 MG/DOSE,) 2 MG/3ML SOPN Inject 0.5 mg into the skin once  a week. 9 mL 3   Tiotropium Bromide-Olodaterol (STIOLTO RESPIMAT) 2.5-2.5 MCG/ACT AERS Inhale 2 puffs into the lungs daily. 4 g 11   Tiotropium Bromide-Olodaterol (STIOLTO RESPIMAT) 2.5-2.5 MCG/ACT AERS Inhale 2 puffs into the lungs daily.     torsemide (DEMADEX) 20 MG tablet Take 2 tablets (40 mg total) by mouth 2 (two) times daily. 360 tablet 3   Vitamin D, Cholecalciferol, 25 MCG (1000 UT) CAPS Take 1 capsule by mouth daily.     No current facility-administered medications on file prior to visit.   Allergies  Allergen Reactions   Levaquin [Levofloxacin] Shortness Of Breath and Swelling    angioedema   Lobster [Shellfish Allergy] Other (See Comments)    angioedema   Penicillins Rash    Has patient had a PCN reaction causing immediate rash, facial/tongue/throat swelling, SOB or lightheadedness with hypotension: Yes Has patient had a PCN reaction causing severe rash involving mucus membranes or skin necrosis: No Has patient had a  PCN reaction that required hospitalization: No Has patient had a PCN reaction occurring within the last 10 years: No If all of the above answers are "NO", then may proceed with Cephalosporin use.   Valsartan Other (See Comments)    REACTION: angioedema   Codeine Other (See Comments)    Mental status changes   Lipitor [Atorvastatin] Other (See Comments)    weakness   Oxycodone Other (See Comments)    hallucinations   Statins     Made her too weak   Tramadol    Amlodipine Besylate Other (See Comments)    REACTION: tingling in lips & gum edema 7/12: talked to patient, states she is tolerating well   Crestor [Rosuvastatin Calcium] Rash   Tramadol Hcl Nausea And Vomiting   Family History  Problem Relation Age of Onset   Diabetes Mother    Hypertension Mother    Transient ischemic attack Mother    Arthritis Mother    Heart attack Father 44   Arthritis Father    Hypertension Father    Breast cancer Maternal Aunt    Arthritis Maternal Aunt    PE: There were no vitals taken for this visit. Wt Readings from Last 10 Encounters:  01/03/24 149 lb 3.2 oz (67.7 kg)  11/01/23 152 lb (68.9 kg)  10/27/23 150 lb 12.8 oz (68.4 kg)  10/08/23 155 lb 11.2 oz (70.6 kg)  09/08/23 148 lb (67.1 kg)  08/30/23 148 lb (67.1 kg)  06/23/23 151 lb 12.8 oz (68.9 kg)  06/02/23 153 lb (69.4 kg)  02/17/23 153 lb 9.6 oz (69.7 kg)  02/03/23 155 lb 3.2 oz (70.4 kg)   Constitutional: Slightly overweight, in NAD Eyes: EOMI, no exophthalmos ENT: no thyromegaly, no cervical lymphadenopathy Cardiovascular: RRR, No MRG, B pitting LE edema - wears compression hoses Respiratory: CTA B Musculoskeletal: no deformities Skin: + stasis dermatitis rash - bilateral lower legs Neurological: no tremor with outstretched hands . ASSESSMENT: 1. DM2, insulin-dependent, uncontrolled, with complications - CAD, s/p NSTEMI 2008 - CHF - PAD - h/o R stent - atrial fibrillation/atrial flutter -previously on amiodarone -  peripheral neuropathy - CKD stage IIIa  2. Obesity class 1  3. HL  PLAN:  1. Patient with longstanding, uncontrolled, type 2 diabetes, previously on a premixed insulin regimen but now on bolus insulin and weekly GLP-1 receptor agonist, with improving blood sugars in the early and an HbA1c of 6.9%, but with higher blood sugars at last visit-HbA1c 7.5%.  At last visit sugars were increasing abruptly  after lunch and they were very variable in the second half of the day.  They were decreasing abruptly overnight with a nadir around 1 AM after which sugars increased again.  Upon questioning, she decreased the dose of Ozempic due to weight loss.  Since weight was approximately stable at last visit we discussed about increasing the dose back to 0.5 mg weekly.  She was interested in simplifying her regimen and she was mentioning that she occasionally was forgetting NovoLog doses.  We discussed about possibly skipping the dose of insulin for dinner as she occasionally had low blood sugars overnight and we continued off Lantus. CGM interpretation: -At today's visit, we reviewed her CGM downloads: It appears that *** of values are in target range (goal >70%), while *** are higher than 180 (goal <25%), and *** are lower than 70 (goal <4%).  The calculated average blood sugar is ***.  The projected HbA1c for the next 3 months (GMI) is ***. -Reviewing the CGM trends, ***  - I suggested to:  Patient Instructions  Please continue: - NovoLog  Before b'fast: 4-6 units  Before lunch: 8-10 units - Ozempic 0.5 mg weekly  Please return in 3-4 months.  - we checked her HbA1c: 7%  - advised to check sugars at different times of the day - 4x a day, rotating check times - advised for yearly eye exams >> she is UTD - return to clinic in 3-4 months  2.  Obesity class I -At previous visits, I suggested to increase intake of fresh fruit, dried fruit, and other snacks.  I also recommended to try to switch from  smoothies in the morning to oatmeal. -We previously discussed about stopping protein shakes between meals -Will continue Ozempic, which should also help with weight loss -Weight was approximately stable at last visit  3. HL -Latest lipid panel showed an elevated LDL, otherwise fractions were at goal Lab Results  Component Value Date   CHOL 315 (H) 01/18/2023   HDL 54.10 01/18/2023   LDLCALC 229 (H) 01/18/2023   LDLDIRECT 144.6 02/03/2010   TRIG 161.0 (H) 01/18/2023   CHOLHDL 6 01/18/2023  -She is not on a statin due to previous muscle weakness -Options for treatment are ezetimibe and evolocumab/alirocumab.  However, she is on palliative care, with only mandatory medications.  Further management per PCP  Carlus Pavlov, MD PhD [facility] Endocrinology

## 2024-03-02 DIAGNOSIS — J449 Chronic obstructive pulmonary disease, unspecified: Secondary | ICD-10-CM | POA: Diagnosis not present

## 2024-03-05 ENCOUNTER — Other Ambulatory Visit (HOSPITAL_COMMUNITY): Payer: Self-pay

## 2024-03-06 ENCOUNTER — Other Ambulatory Visit (HOSPITAL_COMMUNITY): Payer: Self-pay

## 2024-03-06 DIAGNOSIS — M62511 Muscle wasting and atrophy, not elsewhere classified, right shoulder: Secondary | ICD-10-CM | POA: Diagnosis not present

## 2024-03-06 DIAGNOSIS — R278 Other lack of coordination: Secondary | ICD-10-CM | POA: Diagnosis not present

## 2024-03-06 DIAGNOSIS — M62512 Muscle wasting and atrophy, not elsewhere classified, left shoulder: Secondary | ICD-10-CM | POA: Diagnosis not present

## 2024-03-08 DIAGNOSIS — M62512 Muscle wasting and atrophy, not elsewhere classified, left shoulder: Secondary | ICD-10-CM | POA: Diagnosis not present

## 2024-03-08 DIAGNOSIS — R278 Other lack of coordination: Secondary | ICD-10-CM | POA: Diagnosis not present

## 2024-03-08 DIAGNOSIS — M62511 Muscle wasting and atrophy, not elsewhere classified, right shoulder: Secondary | ICD-10-CM | POA: Diagnosis not present

## 2024-03-09 DIAGNOSIS — I4811 Longstanding persistent atrial fibrillation: Secondary | ICD-10-CM | POA: Diagnosis not present

## 2024-03-09 DIAGNOSIS — I509 Heart failure, unspecified: Secondary | ICD-10-CM | POA: Diagnosis not present

## 2024-03-09 DIAGNOSIS — J4489 Other specified chronic obstructive pulmonary disease: Secondary | ICD-10-CM | POA: Diagnosis not present

## 2024-03-09 DIAGNOSIS — E1142 Type 2 diabetes mellitus with diabetic polyneuropathy: Secondary | ICD-10-CM | POA: Diagnosis not present

## 2024-03-09 DIAGNOSIS — I70223 Atherosclerosis of native arteries of extremities with rest pain, bilateral legs: Secondary | ICD-10-CM | POA: Diagnosis not present

## 2024-03-09 DIAGNOSIS — E1122 Type 2 diabetes mellitus with diabetic chronic kidney disease: Secondary | ICD-10-CM | POA: Diagnosis not present

## 2024-03-09 DIAGNOSIS — I130 Hypertensive heart and chronic kidney disease with heart failure and stage 1 through stage 4 chronic kidney disease, or unspecified chronic kidney disease: Secondary | ICD-10-CM | POA: Diagnosis not present

## 2024-03-09 DIAGNOSIS — D6869 Other thrombophilia: Secondary | ICD-10-CM | POA: Diagnosis not present

## 2024-03-09 DIAGNOSIS — J9611 Chronic respiratory failure with hypoxia: Secondary | ICD-10-CM | POA: Diagnosis not present

## 2024-03-09 DIAGNOSIS — N183 Chronic kidney disease, stage 3 unspecified: Secondary | ICD-10-CM | POA: Diagnosis not present

## 2024-03-09 DIAGNOSIS — J9610 Chronic respiratory failure, unspecified whether with hypoxia or hypercapnia: Secondary | ICD-10-CM | POA: Diagnosis not present

## 2024-03-09 DIAGNOSIS — E261 Secondary hyperaldosteronism: Secondary | ICD-10-CM | POA: Diagnosis not present

## 2024-03-12 DIAGNOSIS — J449 Chronic obstructive pulmonary disease, unspecified: Secondary | ICD-10-CM | POA: Diagnosis not present

## 2024-03-13 ENCOUNTER — Other Ambulatory Visit (HOSPITAL_COMMUNITY): Payer: Self-pay

## 2024-03-20 ENCOUNTER — Other Ambulatory Visit: Payer: Self-pay

## 2024-03-23 ENCOUNTER — Other Ambulatory Visit (HOSPITAL_COMMUNITY): Payer: Self-pay

## 2024-03-27 ENCOUNTER — Other Ambulatory Visit: Payer: Self-pay | Admitting: Internal Medicine

## 2024-03-27 NOTE — Telephone Encounter (Signed)
 Copied from CRM [id number]. Topic: Clinical - Medication Question >> Mar 27, 2024 10:04 AM [name] wrote: Reason for CRM: Patient is going to the dentist tomorrow and stated that Dr. Lawerance Bach always prescribed her one tablet of an antibiotic to her pharmacy before her dentist appointments. Patient would like that medication to be sent in. (Patient does not know name of medication.)

## 2024-03-27 NOTE — Telephone Encounter (Signed)
 Medication pending-not sure her preferred pharmacy

## 2024-03-29 ENCOUNTER — Other Ambulatory Visit (HOSPITAL_COMMUNITY): Payer: Self-pay

## 2024-04-02 ENCOUNTER — Telehealth: Payer: Self-pay

## 2024-04-02 DIAGNOSIS — J449 Chronic obstructive pulmonary disease, unspecified: Secondary | ICD-10-CM | POA: Diagnosis not present

## 2024-04-02 NOTE — Telephone Encounter (Signed)
 Pharmacy Patient Advocate Encounter   Received notification from CoverMyMeds that prior authorization for Ozempic is required/requested.   Insurance verification completed.   The patient is insured through [facility] ADVANTAGE/RX ADVANCE .   Per test claim: PA required; PA submitted to above mentioned insurance via CoverMyMeds Key/confirmation #/EOC [id number] Status is pending

## 2024-04-03 DIAGNOSIS — B351 Tinea unguium: Secondary | ICD-10-CM | POA: Diagnosis not present

## 2024-04-03 DIAGNOSIS — M79672 Pain in left foot: Secondary | ICD-10-CM | POA: Diagnosis not present

## 2024-04-03 DIAGNOSIS — M79671 Pain in right foot: Secondary | ICD-10-CM | POA: Diagnosis not present

## 2024-04-03 DIAGNOSIS — L600 Ingrowing nail: Secondary | ICD-10-CM | POA: Diagnosis not present

## 2024-04-03 DIAGNOSIS — I83893 Varicose veins of bilateral lower extremities with other complications: Secondary | ICD-10-CM | POA: Diagnosis not present

## 2024-04-03 DIAGNOSIS — E1140 Type 2 diabetes mellitus with diabetic neuropathy, unspecified: Secondary | ICD-10-CM | POA: Diagnosis not present

## 2024-04-05 ENCOUNTER — Other Ambulatory Visit (HOSPITAL_COMMUNITY): Payer: Self-pay

## 2024-04-11 ENCOUNTER — Telehealth: Payer: Self-pay | Admitting: Internal Medicine

## 2024-04-11 MED ORDER — AZITHROMYCIN 500 MG PO TABS: 500.0000 mg | ORAL_TABLET | Freq: Once | ORAL | 0 refills | Status: AC

## 2024-04-11 NOTE — Telephone Encounter (Signed)
 She is allergic to PCN - usually has azithromycin -- rx sent to [facility]

## 2024-04-11 NOTE — Telephone Encounter (Signed)
 Per previous encounters, pt requesting atb for upcoming dental work.    Copied from CRM [id number]. Topic: Clinical - Medication Refill >> Apr 11, 2024 11:16 AM Varney Gentleman wrote: Most Recent Primary Care Visit:  Provider: Margette Sheldon  Department: [facility]  Visit Type: MEDICARE AWV, SEQUENTIAL  Date: 09/08/2023  Medication: Amoxicillin  Has the patient contacted their pharmacy? Yes, no prescription for patient (Agent: If no, request that the patient contact the pharmacy for the refill. If patient does not wish to contact the pharmacy document the reason why and proceed with request.) (Agent: If yes, when and what did the pharmacy advise?)  Is this the correct pharmacy for this prescription? Yes If no, delete pharmacy and type the correct one.  This is the patient's preferred pharmacy:   [facility] [address] [place] - [address] [place] [address] Phone: [phone] Fax: [phone]  Has the prescription been filled recently? No  Is the patient out of the medication? Yes  Has the patient been seen for an appointment in the last year OR does the patient have an upcoming appointment? Yes  Can we respond through MyChart? Yes  Agent: Please be advised that Rx refills may take up to 3 business days. We ask that you follow-up with your pharmacy.

## 2024-04-11 NOTE — Addendum Note (Signed)
 Addended by: Colene Dauphin on: 04/11/2024 02:15 PM   Modules accepted: Orders

## 2024-04-11 NOTE — Telephone Encounter (Signed)
 Pharmacy Patient Advocate Encounter  Received notification from [facility] ADVANTAGE/RX ADVANCE that Prior Authorization for Ozempic has been APPROVED from 04-03-2024 to 04-03-2025   PA #/Case ID/Reference #: [id number]

## 2024-04-12 DIAGNOSIS — J449 Chronic obstructive pulmonary disease, unspecified: Secondary | ICD-10-CM | POA: Diagnosis not present

## 2024-04-16 ENCOUNTER — Other Ambulatory Visit (HOSPITAL_COMMUNITY): Payer: Self-pay

## 2024-04-16 ENCOUNTER — Other Ambulatory Visit: Payer: Self-pay

## 2024-04-17 ENCOUNTER — Other Ambulatory Visit (HOSPITAL_COMMUNITY): Payer: Self-pay

## 2024-04-17 ENCOUNTER — Telehealth: Payer: Self-pay

## 2024-04-17 NOTE — Telephone Encounter (Signed)
 Patient called stating that she has not been eating 3 times a day. She may eat 1-2 x a day depending on what times she gets up. She has been having some blood sugars in the Morning (3am) that her blood that would be in the 40s and then through out the day its in the 200s and up.   Her medication regimen consist of Novolog  15-20 units at random times of the day.   Patient is wanting to know what should she do? And is there something that she can take once a day?

## 2024-04-17 NOTE — Telephone Encounter (Signed)
 J, For now, I would focus on getting the NovoLog  in 15 minutes before eating.  She could stay on the same doses for now, although these are quite higher than we had last time.  Please advise her not to take NovoLog  after meals, in which case she can drop the blood sugars too low.  I do have an appointment with her coming up and we can discuss more at that time.

## 2024-04-18 ENCOUNTER — Other Ambulatory Visit (HOSPITAL_COMMUNITY): Payer: Self-pay

## 2024-04-18 NOTE — Telephone Encounter (Signed)
 LMTRC

## 2024-04-18 NOTE — Progress Notes (Unsigned)
 Subjective:    Patient ID: [name], female    DOB: 10/06/1931, 89 y.o.   MRN: [id number]      HPI [name] is here for No chief complaint on file.   Stomach cramping, constipation  Taking miralax   Docusate 1-3 pills a day Probiotics Metamucil fiber miralax - which you can take daily if needed Smooth move tea Prunes, prune juice, dates or raisons Magnesium  supplement over the counter      Medications and allergies reviewed with patient and updated if appropriate.  Current Outpatient Medications on File Prior to Visit  Medication Sig Dispense Refill   acetaminophen  (TYLENOL ) 325 MG tablet Take 2 tablets (650 mg total) by mouth every 6 (six) hours as needed for mild pain or headache. 20 tablet 0   albuterol  (VENTOLIN  HFA) 108 (90 Base) MCG/ACT inhaler Inhale 2 puffs into the lungs every 6 (six) hours as needed for wheezing or shortness of breath. 6.7 g 2   amLODipine  (NORVASC ) 5 MG tablet TAKE 1 TABLET BY MOUTH DAILY 90 tablet 3   apixaban  (ELIQUIS ) 5 MG TABS tablet Take 1 tablet (5 mg total) by mouth 2 (two) times daily. 180 tablet 3   Blood Glucose Monitoring Suppl (ONETOUCH VERIO) w/Device KIT Use as instructed to check blood sugar 1 kit 0   carvedilol  (COREG ) 6.25 MG tablet Take 1 tablet (6.25 mg total) by mouth 2 (two) times daily with a meal. 180 tablet 3   Continuous Glucose Receiver (FREESTYLE LIBRE 3 READER) DEVI Use to check glucose continuously. 1 each 0   Continuous Glucose Sensor (FREESTYLE LIBRE 3 PLUS SENSOR) MISC Inject 1 Device into the skin continuous. Change every 15 days 6 each 3   fluticasone  (FLONASE ) 50 MCG/ACT nasal spray Place 1 spray into both nostrils daily as needed for allergies or rhinitis. 16 g 3   glucose blood (ONETOUCH VERIO) test strip CHECK BLOOD SUGAR TWICE DAILY 200 strip 3   insulin  aspart (NOVOLOG  FLEXPEN) 100 UNIT/ML FlexPen Inject 4-6 Units into the skin 3 (three) times daily before meals. 15 mL 3   Insulin  Pen Needle  (BD PEN NEEDLE NANO 2ND GEN) 32G X 4 MM MISC Use as directed 3 (three) times daily. 300 each 3   ipratropium-albuterol  (DUONEB) 0.5-2.5 (3) MG/3ML SOLN Take 3 mLs by nebulization every 6 (six) hours as needed. 360 mL 1   Lancet Devices (LANCING DEVICE) MISC Use as advised - for Delica Lancets 1 each 0   Lancets (ONETOUCH DELICA PLUS LANCET33G) MISC USE TO MONITOR GLUCOSE  LEVELS TWICE DAILY 200 each 3   Multiple Vitamins-Minerals (CENTRUM SILVER ULTRA WOMENS PO) Take 1 tablet by mouth daily.     nitroGLYCERIN  (NITROSTAT ) 0.4 MG SL tablet Place 1 tablet (0.4 mg total) under the tongue every 5 (five) minutes as needed for chest pain. 25 tablet 3   OXYGEN  Inhale 3 Doses into the lungs as needed. 3 liters     potassium chloride  SA (KLOR-CON  M) 20 MEQ tablet Take 2 tablets (40 mEq total) by mouth daily. 180 tablet 3   Semaglutide ,0.25 or 0.5MG /DOS, (OZEMPIC , 0.25 OR 0.5 MG/DOSE,) 2 MG/3ML SOPN Inject 0.5 mg into the skin once a week. 9 mL 3   Tiotropium Bromide -Olodaterol (STIOLTO RESPIMAT ) 2.5-2.5 MCG/ACT AERS Inhale 2 puffs into the lungs daily. 4 g 11   Tiotropium Bromide -Olodaterol (STIOLTO RESPIMAT ) 2.5-2.5 MCG/ACT AERS Inhale 2 puffs into the lungs daily.     torsemide  (DEMADEX ) 20 MG tablet Take 2 tablets (40  mg total) by mouth 2 (two) times daily. 360 tablet 3   Vitamin D , Cholecalciferol , 25 MCG (1000 UT) CAPS Take 1 capsule by mouth daily.     No current facility-administered medications on file prior to visit.    Review of Systems     Objective:  There were no vitals filed for this visit. BP Readings from Last 3 Encounters:  01/03/24 138/65  11/01/23 124/80  10/27/23 (!) 110/50   Wt Readings from Last 3 Encounters:  01/03/24 149 lb 3.2 oz (67.7 kg)  11/01/23 152 lb (68.9 kg)  10/27/23 150 lb 12.8 oz (68.4 kg)   There is no height or weight on file to calculate BMI.    Physical Exam         Assessment & Plan:    See Problem List for Assessment and Plan of chronic  medical problems.

## 2024-04-19 ENCOUNTER — Ambulatory Visit (INDEPENDENT_AMBULATORY_CARE_PROVIDER_SITE_OTHER): Admitting: Internal Medicine

## 2024-04-19 ENCOUNTER — Encounter: Payer: Self-pay | Admitting: Internal Medicine

## 2024-04-19 ENCOUNTER — Other Ambulatory Visit (HOSPITAL_BASED_OUTPATIENT_CLINIC_OR_DEPARTMENT_OTHER): Payer: Self-pay

## 2024-04-19 VITALS — BP 134/72 | HR 68 | Temp 98.0°F | Ht 62.0 in | Wt 147.0 lb

## 2024-04-19 DIAGNOSIS — K5900 Constipation, unspecified: Secondary | ICD-10-CM | POA: Diagnosis not present

## 2024-04-19 DIAGNOSIS — M159 Polyosteoarthritis, unspecified: Secondary | ICD-10-CM

## 2024-04-19 MED ORDER — SENNOSIDES-DOCUSATE SODIUM 8.6-50 MG PO TABS
1.0000 | ORAL_TABLET | Freq: Every day | ORAL | 3 refills | Status: DC
  Filled 2024-04-19: qty 100, 50d supply, fill #0
  Filled 2024-04-23: qty 180, 90d supply, fill #0

## 2024-04-19 NOTE — Patient Instructions (Addendum)
       Medications changes include :   Start taking a stool softener daily - senokot s1-2 tabs daily.    A referral was ordered for OT.    Return if symptoms worsen or fail to improve.

## 2024-04-19 NOTE — Assessment & Plan Note (Signed)
 Chronic She is experiencing some diffuse abdominal pain with palpation only, lower abdominal cramping with bowel movement or with eating and some constipation Her constipation is a chronic problem Her symptoms are consistent with constipation Advised taking MiraLAX daily or a stool softener daily if that is easier-we will think a pill would be easier because she can take that with her other pills and not having to worry about the MiraLAX which is sitting on her counter when she has not taken Senokot-S 1-2 pills daily-prescription sent to pharmacy so that they can get her the correct medication

## 2024-04-19 NOTE — Telephone Encounter (Signed)
 Pt has been notified and voices understanding.

## 2024-04-20 ENCOUNTER — Ambulatory Visit: Admitting: Internal Medicine

## 2024-04-23 ENCOUNTER — Other Ambulatory Visit (HOSPITAL_COMMUNITY): Payer: Self-pay

## 2024-04-23 ENCOUNTER — Other Ambulatory Visit: Payer: Self-pay

## 2024-04-23 ENCOUNTER — Other Ambulatory Visit (HOSPITAL_BASED_OUTPATIENT_CLINIC_OR_DEPARTMENT_OTHER): Payer: Self-pay

## 2024-04-26 ENCOUNTER — Ambulatory Visit (INDEPENDENT_AMBULATORY_CARE_PROVIDER_SITE_OTHER): Admitting: Internal Medicine

## 2024-04-26 ENCOUNTER — Encounter: Payer: Self-pay | Admitting: Internal Medicine

## 2024-04-26 VITALS — BP 142/70 | HR 66 | Ht 62.0 in | Wt 149.6 lb

## 2024-04-26 DIAGNOSIS — Z794 Long term (current) use of insulin: Secondary | ICD-10-CM

## 2024-04-26 DIAGNOSIS — E66811 Obesity, class 1: Secondary | ICD-10-CM | POA: Diagnosis not present

## 2024-04-26 DIAGNOSIS — N1831 Chronic kidney disease, stage 3a: Secondary | ICD-10-CM

## 2024-04-26 DIAGNOSIS — E782 Mixed hyperlipidemia: Secondary | ICD-10-CM

## 2024-04-26 DIAGNOSIS — E1122 Type 2 diabetes mellitus with diabetic chronic kidney disease: Secondary | ICD-10-CM | POA: Diagnosis not present

## 2024-04-26 LAB — POCT GLYCOSYLATED HEMOGLOBIN (HGB A1C): Hemoglobin A1C: 8.3 % — AB (ref 4.0–5.6)

## 2024-04-26 NOTE — Progress Notes (Addendum)
 Patient ID: [name], female   DOB: 1931-12-12, 89 y.o.   MRN: [id number]  HPI: [name] is a 89 y.o.-year-old female, self-referred (her daughter is also my patient), for management of DM2, dx in 1994, insulin -dependent, uncontrolled, with complications (CAD, s/p NSTEMI 2008; CHF; PAD; atrial fibrillation/atrial flutter; peripheral neuropathy; CKD stage IIIa).   Last visit with me 4 months ago.  Patient's daughter accompanies her today and offers information about patient's diet, activity, and insulin  doses.  Interim history: Since last visit she had pneumonia again 12/2023.  She is feeling better now.  No chest pain, shortness of breath, nausea.  Reviewed HbA1c: Lab Results  Component Value Date   HGBA1C 7.5 (H) 10/08/2023   HGBA1C 6.9 06/23/2023   HGBA1C 7.3 (A) 02/03/2023   HGBA1C 7.0 (A) 07/29/2022   HGBA1C 8.4 (A) 03/26/2022   HGBA1C 7.3 (A) 12/25/2021   HGBA1C 9.0 (A) 09/14/2021   HGBA1C 8.3 (A) 05/28/2021   HGBA1C 8.0 (A) 03/24/2021   HGBA1C 9.5 (A) 12/02/2020   HGBA1C 7.8 (A) 08/21/2020   HGBA1C 8.3 (H) 05/15/2020   HGBA1C 8.0 (A) 05/06/2020   HGBA1C 9.2 (H) 02/20/2020   HGBA1C 7.9 (A) 11/14/2019   HGBA1C 7.6 (A) 08/22/2019   HGBA1C 8.1 (A) 05/01/2019   HGBA1C 8.9 (H) 01/31/2019   HGBA1C 7.0 (A) 12/06/2018   HGBA1C 6.5 (A) 09/20/2018   HGBA1C 6.4 (A) 07/19/2018   HGBA1C 7.3 05/08/2018   HGBA1C 8.0 (H) 01/30/2018   HGBA1C 8.3 11/23/2017   HGBA1C 7.4 08/23/2017   HGBA1C 8.2 01/24/2017   HGBA1C 7.7 09/08/2016   HGBA1C 8.4 (H) 06/09/2016   HGBA1C 7.8 (H) 03/04/2016   HGBA1C 7.6 12/08/2015   HGBA1C 8.3 08/26/2015   HGBA1C 7.9 (H) 05/24/2015   HGBA1C 8.6 (H) 02/06/2015   HGBA1C 8.7 (H) 11/15/2014   HGBA1C 8.1 (H) 08/21/2014   HGBA1C 9.6 (H) 01/21/2014   HGBA1C 9.8 (H) 09/13/2013   HGBA1C 9.7 (H) 05/15/2013   HGBA1C 10.9 (H) 02/01/2013   HGBA1C 8.9 (H) 09/26/2012   She was previously on: - Humalog  75/25 >> 70/30 Novolin  (24$ at [facility], w/o Rx)  32 units before breakfast and 6 units before dinner >> .Aaron AasAaron Aas 14 units 30 min before breakfast and 10-12 units 30 min before dinner - Ozempic  0.25 >> 0.5 mg weekly-added 08/2021 >> stopped 12/2021 2/2 $$ but now new insurance >> restarted (patient assistance)  Currently on: - Ozempic  0.5 >> 0.25 mg weekly 2/2 weight loss >> 0.5 mg weekly - >> stopped 2/2 lows 06/2023 - NovoLog  4-6 units 3x a day 10-15 min before meals >> Before b'fast: 4-6 units  Before lunch: 8-10 units >> 4-6 units  Pt checks her sugars >4x a day and they are:  Previously:  Prev.: - am:  89-158, 164, 186 >> 92-170 >> 96-130, 188 - 2h after b'fast: n/c >> 154 >> 172 >> n/c - before lunch: 248 >> 189 >> 94-179 >> 90-130s - 2h after lunch: n/c  >> 148 >> 45 - before dinner: 78-199  >> 116-140, 216 >> 90-130s - 2h after dinner: 171, 198 >> n/c >> 165 >> 200's - bedtime: 6110-209, 274, 497 >> 130, 389, 497 >> n/c - nighttime: n/c >> 93-227 >> 186-286 >>  Lowest sugar was 59 >> 92 >> 45 >> 69 >> 49 (taking 15-20 units at that time); she has hypoglycemia awareness at 70.  Highest sugar was 497 (steroids) >> 250 >> >200 >> 300s.  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6 09/26/2012   MICRALBCREAT 6.9 05/29/2012   MICRALBCREAT 1.7 07/13/2011   MICRALBCREAT 7.3 10/28/2009   MICRALBCREAT 10.3 01/25/2008   MICRALBCREAT 31.5 (H)  10/13/2007  She is not on an ACE inhibitor/ARB  -+ HL; last set of lipids: Lab Results  Component Value Date   CHOL 315 (H) 01/18/2023   HDL 54.10 01/18/2023   LDLCALC 229 (H) 01/18/2023   LDLDIRECT 144.6 02/03/2010   TRIG 161.0 (H) 01/18/2023   CHOLHDL 6 01/18/2023  She had muscle weakness from statins.   - last eye exam was in 2023 (?): No DR reportedly.  - no numbness and tingling in her feet.  She saw podiatry, but not recently.  Last foot exam 03/09/2024.  Pt has FH of DM in mother.  She also has a history of HTN, osteoporosis, protein-calorie malnutrition, iron deficiency anemia. No FH of MTC, no pancreatitis hx.  ROS: + See HPI  Past Medical History:  Diagnosis Date   Anemia    iron defficiency   Anginal pain (HCC)    Arthritis    "knees, feet, hands; joints" (05/27/2015)   Asthma    Atrial fibrillation or flutter    s/p RFCA 7/08;   s/p DCCV in past;   previously on amiodarone ;  amio stopped due to lung toxicity   CAD (coronary artery disease)    s/p NSTEMI tx with BMS to OM1 3/08;  cath 3/08: pOM 99% tx with PCI, pLAD 20%, ? mod stenosis at the AM   CAP (community acquired pneumonia) 05/24/2015   CHF (congestive heart failure) (HCC)    Chronic diastolic heart failure (HCC)    echo 11/11:  EF 55-60%, severe LVH, mod LAE, mild MR, mildly increased PASP   COPD (chronic obstructive pulmonary disease) (HCC)    Degenerative joint disease    Dystrophy, corneal stromal    Heart murmur    HLD (hyperlipidemia)    HTN (hypertension)    essential nos   Hypopotassemia    PMH of   Muscle pain    Myocardial infarction (HCC) 2008   OSA on CPAP    Osteoporosis    Pneumonia 05/27/2015   Protein calorie malnutrition (HCC)    Rash and nonspecific skin eruption    both arms,awaiting bio   dr Joanne Muckle   Seasonal allergies    Shortness of breath dyspnea    Type II diabetes mellitus (HCC)    Dr [place] Hacker   Past Surgical History:  Procedure Laterality Date   A FLUTTER  ABLATION     Dr Carolynne Citron   ABDOMINAL AORTOGRAM W/LOWER EXTREMITY Bilateral 04/11/2020   Procedure: ABDOMINAL AORTOGRAM W/LOWER EXTREMITY;  Surgeon: Dannis Dy, MD;  Location[facility] INVASIVE CV LAB;  Service: Cardiovascular;  Laterality: Bilateral;   CATARACT EXTRACTION W/ INTRAOCULAR LENS  IMPLANT, BILATERAL Bilateral    CHOLECYSTECTOMY     COLONOSCOPY  6/07  2 polyps Dr. Thor Fling in [facility]   colonoscopy with polypectomy      X 2; [facility]   CORNEAL TRANSPLANT Bilateral    CORONARY ANGIOPLASTY WITH STENT PLACEMENT  02/2007   BMS w/Dr Carolynne Citron   CORONARY ATHERECTOMY N/A 05/16/2020   Procedure: CORONARY ATHERECTOMY;  Surgeon: Swaziland, Peter M, MD;  Location: [facility] INVASIVE CV LAB;  Service: Cardiovascular;  Laterality: N/A;   CORONARY BALLOON ANGIOPLASTY N/A 05/16/2020   Procedure: CORONARY BALLOON ANGIOPLASTY;  Surgeon: Swaziland, Peter M, MD;  Location: [facility] INVASIVE CV LAB;  Service: Cardiovascular;  Laterality: N/A;   CORONARY STENT INTERVENTION N/A 05/16/2020   Procedure: CORONARY STENT INTERVENTION;  Surgeon: Swaziland, Peter M, MD;  Location: [facility] INVASIVE CV LAB;  Service: Cardiovascular;  Laterality: N/A;   CORONARY ULTRASOUND/IVUS N/A 05/16/2020   Procedure: Intravascular Ultrasound/IVUS;  Surgeon: Swaziland, Peter M, MD;  Location: [facility] INVASIVE CV LAB;  Service: Cardiovascular;  Laterality: N/A;   EYE SURGERY     gravid 2 para 2     KNEE CARTILAGE SURGERY Right    LEFT AND RIGHT HEART CATHETERIZATION WITH CORONARY ANGIOGRAM N/A 05/07/2014   Procedure: LEFT AND RIGHT HEART CATHETERIZATION WITH CORONARY ANGIOGRAM;  Surgeon: Peter M Swaziland, MD;  Location: [facility] CATH LAB;  Service: Cardiovascular;  Laterality: N/A;   PERIPHERAL VASCULAR INTERVENTION  04/11/2020   Procedure: PERIPHERAL VASCULAR INTERVENTION;  Surgeon: Dannis Dy, MD;  Location: [facility] INVASIVE CV LAB;  Service: Cardiovascular;;   RIGHT HEART CATH N/A 02/04/2021   Procedure: RIGHT HEART CATH;  Surgeon: Mardell Shade, MD;  Location:  [facility] INVASIVE CV LAB;  Service: Cardiovascular;  Laterality: N/A;   RIGHT/LEFT HEART CATH AND CORONARY ANGIOGRAPHY N/A 05/16/2020   Procedure: RIGHT/LEFT HEART CATH AND CORONARY ANGIOGRAPHY;  Surgeon: Mardell Shade, MD;  Location: [facility] INVASIVE CV LAB;  Service: Cardiovascular;  Laterality: N/A;   TOTAL KNEE ARTHROPLASTY Right 06/28/2016   Procedure: TOTAL KNEE ARTHROPLASTY;  Surgeon: Wendolyn Hamburger, MD;  Location: [facility] OR;  Service: Orthopedics;  Laterality: Right;   Social History   Socioeconomic History   Marital status: Widowed    Spouse name: Not on file   Number of children: 2   Years of education: 16   Highest education level: Bachelor's degree (e.g., BA, AB, BS)  Occupational History   Occupation: Teacher/ Retired  Tobacco Use   Smoking status: Former    Current packs/day: 0.00    Average packs/day: 0.3 packs/day for 18.0 years (4.5 ttl pk-yrs)    Types: Cigarettes    Start date: 12/27/1950    Quit date: 12/27/1968    Years since quitting: 55.3   Smokeless tobacco: Never   Tobacco comments:    smoked 1952- 1970, up to 1 pp week  Vaping Use   Vaping status: Never Used  Substance and Sexual Activity   Alcohol use: Yes    Comment: occ   Drug use: No   Sexual activity: Never  Other Topics Concern   Not on file  Social History Narrative   Teacher. Widowed. Rarely drinks cafeine.    Social Drivers of Corporate investment banker Strain: Low Risk  (09/08/2023)   Overall Financial Resource Strain (CARDIA)    Difficulty of Paying Living Expenses: Not hard at all  Food Insecurity: No Food Insecurity (10/14/2023)   Hunger Vital Sign    Worried About Running Out of Food in the Last Year: Never true    Ran Out of Food in the Last Year: Never true  Transportation  Needs: No Transportation Needs (10/14/2023)   PRAPARE - Administrator, Civil Service (Medical): No    Lack of Transportation (Non-Medical): No  Physical Activity: Sufficiently Active (09/08/2023)   Exercise  Vital Sign    Days of Exercise per Week: 5 days    Minutes of Exercise per Session: 30 min  Stress: No Stress Concern Present (09/08/2023)   [employer] of Occupational Health - Occupational Stress Questionnaire    Feeling of Stress : Not at all  Social Connections: Socially Integrated (09/08/2023)   Social Connection and Isolation Panel [NHANES]    Frequency of Communication with Friends and Family: More than three times a week    Frequency of Social Gatherings with Friends and Family: More than three times a week    Attends Religious Services: More than 4 times per year    Active Member of [employer] or Organizations: Yes    Attends Engineer, structural: More than 4 times per year    Marital Status: Married  Catering manager Violence: Not At Risk (10/08/2023)   Humiliation, Afraid, Rape, and Kick questionnaire    Fear of Current or Ex-Partner: No    Emotionally Abused: No    Physically Abused: No    Sexually Abused: No   Current Outpatient Medications on File Prior to Visit  Medication Sig Dispense Refill   acetaminophen  (TYLENOL ) 325 MG tablet Take 2 tablets (650 mg total) by mouth every 6 (six) hours as needed for mild pain or headache. 20 tablet 0   albuterol  (VENTOLIN  HFA) 108 (90 Base) MCG/ACT inhaler Inhale 2 puffs into the lungs every 6 (six) hours as needed for wheezing or shortness of breath. 6.7 g 2   amLODipine  (NORVASC ) 5 MG tablet TAKE 1 TABLET BY MOUTH DAILY 90 tablet 3   apixaban  (ELIQUIS ) 5 MG TABS tablet Take 1 tablet (5 mg total) by mouth 2 (two) times daily. 180 tablet 3   Blood Glucose Monitoring Suppl (ONETOUCH VERIO) w/Device KIT Use as instructed to check blood sugar 1 kit 0   carvedilol  (COREG ) 6.25 MG tablet Take 1 tablet (6.25 mg total) by mouth 2 (two) times daily with a meal. 180 tablet 3   Continuous Glucose Receiver (FREESTYLE LIBRE 3 READER) DEVI Use to check glucose continuously. 1 each 0   Continuous Glucose Sensor (FREESTYLE LIBRE 3 PLUS  SENSOR) MISC Inject 1 Device into the skin continuous. Change every 15 days 6 each 3   fluticasone  (FLONASE ) 50 MCG/ACT nasal spray Place 1 spray into both nostrils daily as needed for allergies or rhinitis. 16 g 3   glucose blood (ONETOUCH VERIO) test strip CHECK BLOOD SUGAR TWICE DAILY 200 strip 3   insulin  aspart (NOVOLOG  FLEXPEN) 100 UNIT/ML FlexPen Inject 4-6 Units into the skin 3 (three) times daily before meals. 15 mL 3   Insulin  Pen Needle (BD PEN NEEDLE NANO 2ND GEN) 32G X 4 MM MISC Use as directed 3 (three) times daily. 300 each 3   ipratropium-albuterol  (DUONEB) 0.5-2.5 (3) MG/3ML SOLN Take 3 mLs by nebulization every 6 (six) hours as needed. 360 mL 1   Lancet Devices (LANCING DEVICE) MISC Use as advised - for Delica Lancets 1 each 0   Lancets (ONETOUCH DELICA PLUS LANCET33G) MISC USE TO MONITOR GLUCOSE  LEVELS TWICE DAILY 200 each 3   Multiple Vitamins-Minerals (CENTRUM SILVER ULTRA WOMENS PO) Take 1 tablet by mouth daily.     nitroGLYCERIN  (NITROSTAT ) 0.4 MG SL tablet Place 1 tablet (0.4 mg total)  under the tongue every 5 (five) minutes as needed for chest pain. 25 tablet 3   OXYGEN  Inhale 3 Doses into the lungs as needed. 3 liters     potassium chloride  SA (KLOR-CON  M) 20 MEQ tablet Take 2 tablets (40 mEq total) by mouth daily. 180 tablet 3   Semaglutide ,0.25 or 0.5MG /DOS, (OZEMPIC , 0.25 OR 0.5 MG/DOSE,) 2 MG/3ML SOPN Inject 0.5 mg into the skin once a week. 9 mL 3   senna-docusate (SENOKOT-S) 8.6-50 MG tablet Take 1-2 tablets by mouth daily. 180 tablet 3   Tiotropium Bromide -Olodaterol (STIOLTO RESPIMAT ) 2.5-2.5 MCG/ACT AERS Inhale 2 puffs into the lungs daily. 4 g 11   Tiotropium Bromide -Olodaterol (STIOLTO RESPIMAT ) 2.5-2.5 MCG/ACT AERS Inhale 2 puffs into the lungs daily.     torsemide  (DEMADEX ) 20 MG tablet Take 2 tablets (40 mg total) by mouth 2 (two) times daily. 360 tablet 3   Vitamin D , Cholecalciferol , 25 MCG (1000 UT) CAPS Take 1 capsule by mouth daily.     No current  facility-administered medications on file prior to visit.   Allergies  Allergen Reactions   Levaquin  [Levofloxacin ] Shortness Of Breath and Swelling    angioedema   Lobster [Shellfish Allergy] Other (See Comments)    angioedema   Penicillins Rash    Has patient had a PCN reaction causing immediate rash, facial/tongue/throat swelling, SOB or lightheadedness with hypotension: Yes Has patient had a PCN reaction causing severe rash involving mucus membranes or skin necrosis: No Has patient had a PCN reaction that required hospitalization: No Has patient had a PCN reaction occurring within the last 10 years: No If all of the above answers are "NO", then may proceed with Cephalosporin use.   Valsartan Other (See Comments)    REACTION: angioedema   Codeine Other (See Comments)    Mental status changes   Lipitor [Atorvastatin ] Other (See Comments)    weakness   Oxycodone  Other (See Comments)    hallucinations   Statins     Made her too weak   Tramadol    Amlodipine  Besylate Other (See Comments)    REACTION: tingling in lips & gum edema 7/12: talked to patient, states she is tolerating well   Crestor  [Rosuvastatin  Calcium ] Rash   Tramadol Hcl Nausea And Vomiting   Family History  Problem Relation Age of Onset   Diabetes Mother    Hypertension Mother    Transient ischemic attack Mother    Arthritis Mother    Heart attack Father 85   Arthritis Father    Hypertension Father    Breast cancer Maternal Aunt    Arthritis Maternal Aunt    PE: BP (!) 142/70   Pulse 66   Ht 5\' 2"  (1.575 m)   Wt 149 lb 9.6 oz (67.9 kg)   SpO2 92%   BMI 27.36 kg/m  Wt Readings from Last 10 Encounters:  04/26/24 149 lb 9.6 oz (67.9 kg)  04/19/24 147 lb (66.7 kg)  01/03/24 149 lb 3.2 oz (67.7 kg)  11/01/23 152 lb (68.9 kg)  10/27/23 150 lb 12.8 oz (68.4 kg)  10/08/23 155 lb 11.2 oz (70.6 kg)  09/08/23 148 lb (67.1 kg)  08/30/23 148 lb (67.1 kg)  06/23/23 151 lb 12.8 oz (68.9 kg)  06/02/23 153 lb  (69.4 kg)   Constitutional: Slightly overweight, in NAD Eyes: EOMI, no exophthalmos ENT: no thyromegaly, no cervical lymphadenopathy Cardiovascular: RRR, No MRG, B pitting LE edema - wears compression hoses Respiratory: CTA B Musculoskeletal: no deformities Skin: + stasis  dermatitis rash - bilateral lower legs Neurological: no tremor with outstretched hands . ASSESSMENT: 1. DM2, insulin -dependent, uncontrolled, with complications - CAD, s/p NSTEMI 2008 - CHF - PAD - h/o R stent - atrial fibrillation/atrial flutter -previously on amiodarone  - peripheral neuropathy - CKD stage IIIa  2. Obesity class 1  3. HL  PLAN:  1. Patient with longstanding, uncontrolled, type 2 diabetes, on mealtime insulin  along with weekly GLP-1 receptor agonist, with better control at last visit.  At that time, HbA1c was 7.5%, slightly higher, but at goal for age.  We increased her Ozempic  dose at that time.  Sugars were increasing abruptly after lunch and they were very variable in the second half of the day and decreasing overnight with a nadir around 1 AM.  She was interested in simplifying her regimen and she was mentioning that she was occasionally forgetting the insulin  doses.  We discussed about trying to skip the dose of insulin  before dinner to see if the sugars overnight will improve. CGM interpretation: -At today's visit, we reviewed her CGM downloads: It appears that 45% of values are in target range (goal >70%), while 54% are higher than 180 (goal <25%), and 1% are lower than 70 (goal <4%).  The calculated average blood sugar is 196.  The projected HbA1c for the next 3 months (GMI) is 8.0%. -Reviewing the CGM trends, sugars appear to have improved in the last several days, after she started to take NovoLog  correctly.  She called us  few days ago with fluctuating blood sugars including hyper and hypoglycemia.  Upon questioning, she was taking 15 to 20 units of NovoLog  at random times of the day, mostly  when the sugars were elevated, with subsequent hypoglycemia.  We advised her to take a lower dose of NovoLog , 4 to 6 units before meals and sugars improved after that.  As of now, sugars are still higher after lunch and I advised her to increase the dose of NovoLog  before this meal.  Due to the decrease in blood sugars overnight, I did not recommend to take NovoLog  before dinner.  She is interested in a simplified regimen, but for now, we will need to continue with NovoLog  before breakfast and lunch. -This morning, her sugars increase significantly and upon questioning she had pancake with syrup.  She did take the insulin  before this meal.  I explained that this is not a meal that she can tolerate (due to the high glycemic index 0) so I advised her to eliminate it. - I suggested to:  Patient Instructions  Please use the following regimen: - NovoLog  10-15 minutes before meals: Before b'fast: 4-6 units  Before lunch: 8-10 units - Ozempic  0.5 mg weekly  Please return in 3-4 months.  - advised to check sugars at different times of the day - 4x a day, rotating check times - advised for yearly eye exams >> she is UTD - will check an ACR today. - return to clinic in 3-4 months  2.  Obesity class I -At previous visits, I suggested to increase intake of fresh fruit, dried fruit, and other snacks.  I also recommended to try to switch from smoothies in the morning to oatmeal. - We discussed about stopping protein shakes between meals at the previous visits - She lost 1 pound before last visit and lost 3 pounds since last visit - Will continue Ozempic  which should also help with weight loss  3. HL - Latest lipid panel was reviewed from last year: LDL  very high: Lab Results  Component Value Date   CHOL 315 (H) 01/18/2023   HDL 54.10 01/18/2023   LDLCALC 229 (H) 01/18/2023   LDLDIRECT 144.6 02/03/2010   TRIG 161.0 (H) 01/18/2023   CHOLHDL 6 01/18/2023  -She could not tolerate statins due to muscle  weakness -Options for treatment are ezetimibe  and evolocumab /alirocumab.  However, she is on palliative care, with only mandatory medications.  We did discuss about improving diet.  Other the regimen per PCP.  Component     Latest Ref Rng 04/26/2024  Hemoglobin A1C     4.0 - 5.6 % 8.3 !   Creatinine, Urine     20 - 275 mg/dL 34   Microalb, Ur     mg/dL 16.1   MICROALB/CREAT RATIO     <30 mg/g creat 300 (H)   Urinary proteins are elevated.  Improving her diabetes control will likely help.  I would like to repeat the ACR at next visit.  Emilie Harden, MD PhD [facility] Endocrinology

## 2024-04-26 NOTE — Patient Instructions (Addendum)
 Please use the following regimen: - NovoLog  10-15 minutes before meals: Before b'fast: 4-6 units  Before lunch: 8-10 units - Ozempic  0.5 mg weekly  Please return in 3-4 months.

## 2024-04-27 ENCOUNTER — Encounter: Payer: Self-pay | Admitting: Internal Medicine

## 2024-04-27 LAB — MICROALBUMIN / CREATININE URINE RATIO
Creatinine, Urine: 34 mg/dL (ref 20–275)
Microalb Creat Ratio: 300 mg/g{creat} — ABNORMAL HIGH (ref <30)
Microalb, Ur: 10.2 mg/dL

## 2024-05-02 DIAGNOSIS — J449 Chronic obstructive pulmonary disease, unspecified: Secondary | ICD-10-CM | POA: Diagnosis not present

## 2024-05-03 ENCOUNTER — Other Ambulatory Visit (HOSPITAL_BASED_OUTPATIENT_CLINIC_OR_DEPARTMENT_OTHER): Payer: Self-pay

## 2024-05-10 ENCOUNTER — Other Ambulatory Visit (HOSPITAL_COMMUNITY): Payer: Self-pay

## 2024-05-11 ENCOUNTER — Other Ambulatory Visit: Payer: Self-pay

## 2024-05-12 DIAGNOSIS — J449 Chronic obstructive pulmonary disease, unspecified: Secondary | ICD-10-CM | POA: Diagnosis not present

## 2024-05-16 ENCOUNTER — Other Ambulatory Visit: Payer: Self-pay

## 2024-05-18 ENCOUNTER — Other Ambulatory Visit (HOSPITAL_COMMUNITY): Payer: Self-pay

## 2024-06-01 ENCOUNTER — Other Ambulatory Visit: Payer: Self-pay

## 2024-06-01 ENCOUNTER — Other Ambulatory Visit (HOSPITAL_COMMUNITY): Payer: Self-pay

## 2024-06-02 DIAGNOSIS — J449 Chronic obstructive pulmonary disease, unspecified: Secondary | ICD-10-CM | POA: Diagnosis not present

## 2024-06-12 DIAGNOSIS — J449 Chronic obstructive pulmonary disease, unspecified: Secondary | ICD-10-CM | POA: Diagnosis not present

## 2024-06-21 ENCOUNTER — Other Ambulatory Visit (HOSPITAL_COMMUNITY): Payer: Self-pay

## 2024-06-21 ENCOUNTER — Other Ambulatory Visit: Payer: Self-pay

## 2024-07-02 DIAGNOSIS — M25541 Pain in joints of right hand: Secondary | ICD-10-CM | POA: Diagnosis not present

## 2024-07-02 DIAGNOSIS — M25542 Pain in joints of left hand: Secondary | ICD-10-CM | POA: Diagnosis not present

## 2024-07-02 DIAGNOSIS — J449 Chronic obstructive pulmonary disease, unspecified: Secondary | ICD-10-CM | POA: Diagnosis not present

## 2024-07-02 DIAGNOSIS — R278 Other lack of coordination: Secondary | ICD-10-CM | POA: Diagnosis not present

## 2024-07-03 ENCOUNTER — Other Ambulatory Visit (HOSPITAL_COMMUNITY): Payer: Self-pay

## 2024-07-04 ENCOUNTER — Other Ambulatory Visit (HOSPITAL_COMMUNITY): Payer: Self-pay

## 2024-07-09 DIAGNOSIS — R278 Other lack of coordination: Secondary | ICD-10-CM | POA: Diagnosis not present

## 2024-07-09 DIAGNOSIS — M25541 Pain in joints of right hand: Secondary | ICD-10-CM | POA: Diagnosis not present

## 2024-07-09 DIAGNOSIS — M25542 Pain in joints of left hand: Secondary | ICD-10-CM | POA: Diagnosis not present

## 2024-07-11 ENCOUNTER — Encounter: Payer: Self-pay | Admitting: Nurse Practitioner

## 2024-07-11 ENCOUNTER — Ambulatory Visit: Admitting: Nurse Practitioner

## 2024-07-11 DIAGNOSIS — M25542 Pain in joints of left hand: Secondary | ICD-10-CM | POA: Diagnosis not present

## 2024-07-11 DIAGNOSIS — R278 Other lack of coordination: Secondary | ICD-10-CM | POA: Diagnosis not present

## 2024-07-11 DIAGNOSIS — M25541 Pain in joints of right hand: Secondary | ICD-10-CM | POA: Diagnosis not present

## 2024-07-12 DIAGNOSIS — J449 Chronic obstructive pulmonary disease, unspecified: Secondary | ICD-10-CM | POA: Diagnosis not present

## 2024-07-16 ENCOUNTER — Other Ambulatory Visit (HOSPITAL_COMMUNITY): Payer: Self-pay

## 2024-07-18 ENCOUNTER — Other Ambulatory Visit (HOSPITAL_COMMUNITY): Payer: Self-pay

## 2024-07-18 DIAGNOSIS — M25542 Pain in joints of left hand: Secondary | ICD-10-CM | POA: Diagnosis not present

## 2024-07-18 DIAGNOSIS — M25541 Pain in joints of right hand: Secondary | ICD-10-CM | POA: Diagnosis not present

## 2024-07-18 DIAGNOSIS — R278 Other lack of coordination: Secondary | ICD-10-CM | POA: Diagnosis not present

## 2024-07-18 MED FILL — "Insulin Pen Needle 32 G X 4 MM (1/6"" or 5/32"")": 100 days supply | Qty: 300 | Fill #1 | Status: AC

## 2024-07-18 MED FILL — "Insulin Pen Needle 32 G X 4 MM (1/6"" or 5/32"")": 100 days supply | Qty: 300 | Fill #1 | Status: CN

## 2024-07-25 DIAGNOSIS — M25542 Pain in joints of left hand: Secondary | ICD-10-CM | POA: Diagnosis not present

## 2024-07-25 DIAGNOSIS — R278 Other lack of coordination: Secondary | ICD-10-CM | POA: Diagnosis not present

## 2024-07-25 DIAGNOSIS — M25541 Pain in joints of right hand: Secondary | ICD-10-CM | POA: Diagnosis not present

## 2024-07-27 ENCOUNTER — Telehealth: Payer: Self-pay

## 2024-07-27 NOTE — Telephone Encounter (Signed)
 Patient was identified as falling into the [place] Measure - Diabetes.   Patient was: Referred to Diabetes Management. Pt follows with Endo

## 2024-07-31 DIAGNOSIS — M6281 Muscle weakness (generalized): Secondary | ICD-10-CM | POA: Diagnosis not present

## 2024-07-31 DIAGNOSIS — M25542 Pain in joints of left hand: Secondary | ICD-10-CM | POA: Diagnosis not present

## 2024-07-31 DIAGNOSIS — R2689 Other abnormalities of gait and mobility: Secondary | ICD-10-CM | POA: Diagnosis not present

## 2024-07-31 DIAGNOSIS — R278 Other lack of coordination: Secondary | ICD-10-CM | POA: Diagnosis not present

## 2024-07-31 DIAGNOSIS — R531 Weakness: Secondary | ICD-10-CM | POA: Diagnosis not present

## 2024-07-31 DIAGNOSIS — M25541 Pain in joints of right hand: Secondary | ICD-10-CM | POA: Diagnosis not present

## 2024-07-31 DIAGNOSIS — R2681 Unsteadiness on feet: Secondary | ICD-10-CM | POA: Diagnosis not present

## 2024-08-02 ENCOUNTER — Other Ambulatory Visit (HOSPITAL_COMMUNITY): Payer: Self-pay

## 2024-08-02 ENCOUNTER — Other Ambulatory Visit: Payer: Self-pay

## 2024-08-02 DIAGNOSIS — R2689 Other abnormalities of gait and mobility: Secondary | ICD-10-CM | POA: Diagnosis not present

## 2024-08-02 DIAGNOSIS — R278 Other lack of coordination: Secondary | ICD-10-CM | POA: Diagnosis not present

## 2024-08-02 DIAGNOSIS — M25541 Pain in joints of right hand: Secondary | ICD-10-CM | POA: Diagnosis not present

## 2024-08-02 DIAGNOSIS — M25542 Pain in joints of left hand: Secondary | ICD-10-CM | POA: Diagnosis not present

## 2024-08-02 DIAGNOSIS — M6281 Muscle weakness (generalized): Secondary | ICD-10-CM | POA: Diagnosis not present

## 2024-08-02 DIAGNOSIS — J449 Chronic obstructive pulmonary disease, unspecified: Secondary | ICD-10-CM | POA: Diagnosis not present

## 2024-08-02 DIAGNOSIS — R531 Weakness: Secondary | ICD-10-CM | POA: Diagnosis not present

## 2024-08-02 DIAGNOSIS — R2681 Unsteadiness on feet: Secondary | ICD-10-CM | POA: Diagnosis not present

## 2024-08-09 DIAGNOSIS — R2681 Unsteadiness on feet: Secondary | ICD-10-CM | POA: Diagnosis not present

## 2024-08-09 DIAGNOSIS — R531 Weakness: Secondary | ICD-10-CM | POA: Diagnosis not present

## 2024-08-09 DIAGNOSIS — R2689 Other abnormalities of gait and mobility: Secondary | ICD-10-CM | POA: Diagnosis not present

## 2024-08-09 DIAGNOSIS — M6281 Muscle weakness (generalized): Secondary | ICD-10-CM | POA: Diagnosis not present

## 2024-08-09 DIAGNOSIS — R278 Other lack of coordination: Secondary | ICD-10-CM | POA: Diagnosis not present

## 2024-08-09 DIAGNOSIS — M25541 Pain in joints of right hand: Secondary | ICD-10-CM | POA: Diagnosis not present

## 2024-08-09 DIAGNOSIS — M25542 Pain in joints of left hand: Secondary | ICD-10-CM | POA: Diagnosis not present

## 2024-08-11 DIAGNOSIS — M25542 Pain in joints of left hand: Secondary | ICD-10-CM | POA: Diagnosis not present

## 2024-08-11 DIAGNOSIS — R531 Weakness: Secondary | ICD-10-CM | POA: Diagnosis not present

## 2024-08-11 DIAGNOSIS — R2681 Unsteadiness on feet: Secondary | ICD-10-CM | POA: Diagnosis not present

## 2024-08-11 DIAGNOSIS — R278 Other lack of coordination: Secondary | ICD-10-CM | POA: Diagnosis not present

## 2024-08-11 DIAGNOSIS — R2689 Other abnormalities of gait and mobility: Secondary | ICD-10-CM | POA: Diagnosis not present

## 2024-08-11 DIAGNOSIS — M25541 Pain in joints of right hand: Secondary | ICD-10-CM | POA: Diagnosis not present

## 2024-08-11 DIAGNOSIS — M6281 Muscle weakness (generalized): Secondary | ICD-10-CM | POA: Diagnosis not present

## 2024-08-12 DIAGNOSIS — J449 Chronic obstructive pulmonary disease, unspecified: Secondary | ICD-10-CM | POA: Diagnosis not present

## 2024-08-14 DIAGNOSIS — R2681 Unsteadiness on feet: Secondary | ICD-10-CM | POA: Diagnosis not present

## 2024-08-14 DIAGNOSIS — M6281 Muscle weakness (generalized): Secondary | ICD-10-CM | POA: Diagnosis not present

## 2024-08-14 DIAGNOSIS — R2689 Other abnormalities of gait and mobility: Secondary | ICD-10-CM | POA: Diagnosis not present

## 2024-08-14 DIAGNOSIS — R531 Weakness: Secondary | ICD-10-CM | POA: Diagnosis not present

## 2024-08-14 DIAGNOSIS — M25541 Pain in joints of right hand: Secondary | ICD-10-CM | POA: Diagnosis not present

## 2024-08-14 DIAGNOSIS — R278 Other lack of coordination: Secondary | ICD-10-CM | POA: Diagnosis not present

## 2024-08-14 DIAGNOSIS — M25542 Pain in joints of left hand: Secondary | ICD-10-CM | POA: Diagnosis not present

## 2024-08-15 DIAGNOSIS — M6281 Muscle weakness (generalized): Secondary | ICD-10-CM | POA: Diagnosis not present

## 2024-08-15 DIAGNOSIS — R278 Other lack of coordination: Secondary | ICD-10-CM | POA: Diagnosis not present

## 2024-08-15 DIAGNOSIS — R531 Weakness: Secondary | ICD-10-CM | POA: Diagnosis not present

## 2024-08-15 DIAGNOSIS — M25542 Pain in joints of left hand: Secondary | ICD-10-CM | POA: Diagnosis not present

## 2024-08-15 DIAGNOSIS — R2689 Other abnormalities of gait and mobility: Secondary | ICD-10-CM | POA: Diagnosis not present

## 2024-08-15 DIAGNOSIS — M25541 Pain in joints of right hand: Secondary | ICD-10-CM | POA: Diagnosis not present

## 2024-08-15 DIAGNOSIS — R2681 Unsteadiness on feet: Secondary | ICD-10-CM | POA: Diagnosis not present

## 2024-08-16 DIAGNOSIS — M25541 Pain in joints of right hand: Secondary | ICD-10-CM | POA: Diagnosis not present

## 2024-08-16 DIAGNOSIS — M6281 Muscle weakness (generalized): Secondary | ICD-10-CM | POA: Diagnosis not present

## 2024-08-16 DIAGNOSIS — M25542 Pain in joints of left hand: Secondary | ICD-10-CM | POA: Diagnosis not present

## 2024-08-16 DIAGNOSIS — R2681 Unsteadiness on feet: Secondary | ICD-10-CM | POA: Diagnosis not present

## 2024-08-16 DIAGNOSIS — R278 Other lack of coordination: Secondary | ICD-10-CM | POA: Diagnosis not present

## 2024-08-16 DIAGNOSIS — R2689 Other abnormalities of gait and mobility: Secondary | ICD-10-CM | POA: Diagnosis not present

## 2024-08-16 DIAGNOSIS — R531 Weakness: Secondary | ICD-10-CM | POA: Diagnosis not present

## 2024-08-20 ENCOUNTER — Encounter: Payer: Self-pay | Admitting: Internal Medicine

## 2024-08-20 NOTE — Progress Notes (Unsigned)
 Subjective:    Patient ID: [name], female    DOB: [birth year], 89 y.o.   MRN: [id number]      HPI [name] is here for No chief complaint on file.        Medications and allergies reviewed with patient and updated if appropriate.  Current Outpatient Medications on File Prior to Visit  Medication Sig Dispense Refill   acetaminophen  (TYLENOL ) 325 MG tablet Take 2 tablets (650 mg total) by mouth every 6 (six) hours as needed for mild pain or headache. 20 tablet 0   albuterol  (VENTOLIN  HFA) 108 (90 Base) MCG/ACT inhaler Inhale 2 puffs into the lungs every 6 (six) hours as needed for wheezing or shortness of breath. 6.7 g 2   amLODipine  (NORVASC ) 5 MG tablet TAKE 1 TABLET BY MOUTH DAILY 90 tablet 3   apixaban  (ELIQUIS ) 5 MG TABS tablet Take 1 tablet (5 mg total) by mouth 2 (two) times daily. 180 tablet 3   Blood Glucose Monitoring Suppl (ONETOUCH VERIO) w/Device KIT Use as instructed to check blood sugar 1 kit 0   carvedilol  (COREG ) 6.25 MG tablet Take 1 tablet (6.25 mg total) by mouth 2 (two) times daily with a meal. 180 tablet 3   Continuous Glucose Receiver (FREESTYLE LIBRE 3 READER) DEVI Use to check glucose continuously. 1 each 0   Continuous Glucose Sensor (FREESTYLE LIBRE 3 PLUS SENSOR) MISC Inject 1 Device into the skin continuous. Change every 15 days 6 each 3   fluticasone  (FLONASE ) 50 MCG/ACT nasal spray Place 1 spray into both nostrils daily as needed for allergies or rhinitis. 16 g 3   glucose blood (ONETOUCH VERIO) test strip CHECK BLOOD SUGAR TWICE DAILY 200 strip 3   insulin  aspart (NOVOLOG  FLEXPEN) 100 UNIT/ML FlexPen Inject 4-6 Units into the skin 3 (three) times daily before meals. 15 mL 3   Insulin  Pen Needle (BD PEN NEEDLE NANO 2ND GEN) 32G X 4 MM MISC Use as directed 3 (three) times daily. 300 each 3   ipratropium-albuterol  (DUONEB) 0.5-2.5 (3) MG/3ML SOLN Take 3 mLs by nebulization every 6 (six) hours as needed. 360 mL 1   Lancet Devices (LANCING  DEVICE) MISC Use as advised - for Delica Lancets 1 each 0   Lancets (ONETOUCH DELICA PLUS LANCET33G) MISC USE TO MONITOR GLUCOSE  LEVELS TWICE DAILY 200 each 3   Multiple Vitamins-Minerals (CENTRUM SILVER ULTRA WOMENS PO) Take 1 tablet by mouth daily.     nitroGLYCERIN  (NITROSTAT ) 0.4 MG SL tablet Place 1 tablet (0.4 mg total) under the tongue every 5 (five) minutes as needed for chest pain. 25 tablet 3   OXYGEN  Inhale 3 Doses into the lungs as needed. 3 liters     potassium chloride  SA (KLOR-CON  M) 20 MEQ tablet Take 2 tablets (40 mEq total) by mouth daily. 180 tablet 3   Semaglutide ,0.25 or 0.5MG /DOS, (OZEMPIC , 0.25 OR 0.5 MG/DOSE,) 2 MG/3ML SOPN Inject 0.5 mg into the skin once a week. 9 mL 3   senna-docusate (SENOKOT-S) 8.6-50 MG tablet Take 1-2 tablets by mouth daily. 180 tablet 3   Tiotropium Bromide -Olodaterol (STIOLTO RESPIMAT ) 2.5-2.5 MCG/ACT AERS Inhale 2 puffs into the lungs daily. 4 g 11   Tiotropium Bromide -Olodaterol (STIOLTO RESPIMAT ) 2.5-2.5 MCG/ACT AERS Inhale 2 puffs into the lungs daily.     torsemide  (DEMADEX ) 20 MG tablet Take 2 tablets (40 mg total) by mouth 2 (two) times daily. 360 tablet 3   Vitamin D , Cholecalciferol , 25 MCG (1000 UT) CAPS Take 1  capsule by mouth daily.     No current facility-administered medications on file prior to visit.    Review of Systems     Objective:  There were no vitals filed for this visit. BP Readings from Last 3 Encounters:  04/26/24 (!) 142/70  04/19/24 134/72  01/03/24 138/65   Wt Readings from Last 3 Encounters:  04/26/24 149 lb 9.6 oz (67.9 kg)  04/19/24 147 lb (66.7 kg)  01/03/24 149 lb 3.2 oz (67.7 kg)   There is no height or weight on file to calculate BMI.    Physical Exam         Assessment & Plan:    See Problem List for Assessment and Plan of chronic medical problems.

## 2024-08-21 ENCOUNTER — Ambulatory Visit (INDEPENDENT_AMBULATORY_CARE_PROVIDER_SITE_OTHER): Admitting: Internal Medicine

## 2024-08-21 ENCOUNTER — Other Ambulatory Visit (HOSPITAL_BASED_OUTPATIENT_CLINIC_OR_DEPARTMENT_OTHER): Payer: Self-pay

## 2024-08-21 VITALS — BP 126/60 | HR 77 | Temp 98.7°F | Ht 62.0 in | Wt 148.0 lb

## 2024-08-21 DIAGNOSIS — R32 Unspecified urinary incontinence: Secondary | ICD-10-CM

## 2024-08-21 DIAGNOSIS — R350 Frequency of micturition: Secondary | ICD-10-CM

## 2024-08-21 LAB — URINALYSIS, ROUTINE W REFLEX MICROSCOPIC
Bilirubin Urine: NEGATIVE
Hgb urine dipstick: NEGATIVE
Ketones, ur: NEGATIVE
Nitrite: NEGATIVE
Specific Gravity, Urine: 1.015 (ref 1.000–1.030)
Total Protein, Urine: 100 — AB
Urine Glucose: NEGATIVE
Urobilinogen, UA: 0.2 (ref 0.0–1.0)
pH: 6 (ref 5.0–8.0)

## 2024-08-21 MED ORDER — NITROGLYCERIN 0.4 MG SL SUBL
0.4000 mg | SUBLINGUAL_TABLET | SUBLINGUAL | 3 refills | Status: AC | PRN
  Filled 2024-08-21: qty 25, 25d supply, fill #0
  Filled 2024-08-22: qty 25, 8d supply, fill #0
  Filled 2024-11-18: qty 25, 8d supply, fill #1

## 2024-08-21 NOTE — Assessment & Plan Note (Signed)
 Chronic Has gotten worse Has frequency, urgency and incontinence Wears a pad and a pull up Will check UA, urine culture to rule out infection, but I do not think she has 1 She will restart the bladder exercise classes at her facility that did seem to help in the past-continue exercises outside of class as well Will refer to urogynecology Discussed medication and other options for treatment-she would like to hold off on medication for now

## 2024-08-21 NOTE — Patient Instructions (Signed)
     Give a urine sample in the lab.     Medications changes include :   None    A referral was ordered for Uro-gynecology and someone will call you to schedule an appointment.

## 2024-08-22 ENCOUNTER — Other Ambulatory Visit (HOSPITAL_BASED_OUTPATIENT_CLINIC_OR_DEPARTMENT_OTHER): Payer: Self-pay

## 2024-08-22 ENCOUNTER — Ambulatory Visit: Payer: Self-pay | Admitting: Internal Medicine

## 2024-08-22 ENCOUNTER — Other Ambulatory Visit (HOSPITAL_COMMUNITY): Payer: Self-pay

## 2024-08-22 ENCOUNTER — Other Ambulatory Visit: Payer: Self-pay

## 2024-08-22 DIAGNOSIS — M6281 Muscle weakness (generalized): Secondary | ICD-10-CM | POA: Diagnosis not present

## 2024-08-22 DIAGNOSIS — M25541 Pain in joints of right hand: Secondary | ICD-10-CM | POA: Diagnosis not present

## 2024-08-22 DIAGNOSIS — R278 Other lack of coordination: Secondary | ICD-10-CM | POA: Diagnosis not present

## 2024-08-22 DIAGNOSIS — R2681 Unsteadiness on feet: Secondary | ICD-10-CM | POA: Diagnosis not present

## 2024-08-22 DIAGNOSIS — R531 Weakness: Secondary | ICD-10-CM | POA: Diagnosis not present

## 2024-08-22 DIAGNOSIS — R2689 Other abnormalities of gait and mobility: Secondary | ICD-10-CM | POA: Diagnosis not present

## 2024-08-22 DIAGNOSIS — M25542 Pain in joints of left hand: Secondary | ICD-10-CM | POA: Diagnosis not present

## 2024-08-22 LAB — URINE CULTURE

## 2024-08-28 ENCOUNTER — Encounter: Payer: Self-pay | Admitting: Internal Medicine

## 2024-08-28 DIAGNOSIS — M25541 Pain in joints of right hand: Secondary | ICD-10-CM | POA: Diagnosis not present

## 2024-08-28 DIAGNOSIS — M6281 Muscle weakness (generalized): Secondary | ICD-10-CM | POA: Diagnosis not present

## 2024-08-28 DIAGNOSIS — R2689 Other abnormalities of gait and mobility: Secondary | ICD-10-CM | POA: Diagnosis not present

## 2024-08-28 DIAGNOSIS — R278 Other lack of coordination: Secondary | ICD-10-CM | POA: Diagnosis not present

## 2024-08-28 DIAGNOSIS — M25542 Pain in joints of left hand: Secondary | ICD-10-CM | POA: Diagnosis not present

## 2024-08-28 DIAGNOSIS — R531 Weakness: Secondary | ICD-10-CM | POA: Diagnosis not present

## 2024-08-28 DIAGNOSIS — R2681 Unsteadiness on feet: Secondary | ICD-10-CM | POA: Diagnosis not present

## 2024-08-29 DIAGNOSIS — M6281 Muscle weakness (generalized): Secondary | ICD-10-CM | POA: Diagnosis not present

## 2024-08-29 DIAGNOSIS — M25541 Pain in joints of right hand: Secondary | ICD-10-CM | POA: Diagnosis not present

## 2024-08-29 DIAGNOSIS — R2689 Other abnormalities of gait and mobility: Secondary | ICD-10-CM | POA: Diagnosis not present

## 2024-08-29 DIAGNOSIS — M25542 Pain in joints of left hand: Secondary | ICD-10-CM | POA: Diagnosis not present

## 2024-08-29 DIAGNOSIS — R531 Weakness: Secondary | ICD-10-CM | POA: Diagnosis not present

## 2024-08-29 DIAGNOSIS — R2681 Unsteadiness on feet: Secondary | ICD-10-CM | POA: Diagnosis not present

## 2024-08-29 DIAGNOSIS — R278 Other lack of coordination: Secondary | ICD-10-CM | POA: Diagnosis not present

## 2024-08-30 ENCOUNTER — Encounter: Payer: Self-pay | Admitting: Internal Medicine

## 2024-08-30 ENCOUNTER — Ambulatory Visit (INDEPENDENT_AMBULATORY_CARE_PROVIDER_SITE_OTHER): Admitting: Internal Medicine

## 2024-08-30 VITALS — BP 140/70 | HR 82 | Ht 62.0 in | Wt 148.2 lb

## 2024-08-30 DIAGNOSIS — Z794 Long term (current) use of insulin: Secondary | ICD-10-CM | POA: Diagnosis not present

## 2024-08-30 DIAGNOSIS — E1122 Type 2 diabetes mellitus with diabetic chronic kidney disease: Secondary | ICD-10-CM

## 2024-08-30 DIAGNOSIS — E66811 Obesity, class 1: Secondary | ICD-10-CM

## 2024-08-30 DIAGNOSIS — N1831 Chronic kidney disease, stage 3a: Secondary | ICD-10-CM

## 2024-08-30 DIAGNOSIS — E782 Mixed hyperlipidemia: Secondary | ICD-10-CM | POA: Diagnosis not present

## 2024-08-30 LAB — POCT GLYCOSYLATED HEMOGLOBIN (HGB A1C): Hemoglobin A1C: 8.7 % — AB (ref 4.0–5.6)

## 2024-08-30 LAB — GLUCOSE, POCT (MANUAL RESULT ENTRY): POC Glucose: 237 mg/dL — AB (ref 70–99)

## 2024-08-30 NOTE — Patient Instructions (Addendum)
 Please use the following regimen: - NovoLog  10-15 minutes before all 3 meals: Smaller meal: 6 units  Regular meal: 8 units Larger meal: 10 units - Ozempic  0.5 mg weekly  Please return in 4 months.  Please consider the following ways to cut down carbs and fat and increase fiber and micronutrients in your diet: - substitute whole grain for white bread or pasta - substitute brown rice for white rice - substitute 90-calorie flat bread pieces for slices of bread when possible - substitute sweet potatoes or yams for white potatoes - substitute humus for margarine - substitute tofu for cheese when possible - substitute almond or rice milk for regular milk (would not drink soy milk daily due to concern for soy estrogen influence on breast cancer risk) - substitute dark chocolate for other sweets when possible - substitute water - can add lemon or orange slices for taste - for diet sodas (artificial sweeteners will trick your body that you can eat sweets without getting calories and will lead you to overeating and weight gain in the long run) - do not skip breakfast or other meals (this will slow down the metabolism and will result in more weight gain over time)  - can try smoothies made from fruit and almond/rice milk in am instead of regular breakfast - can also try old-fashioned (not instant) oatmeal made with almond/rice milk in am - order the dressing on the side when eating salad at a restaurant (pour less than half of the dressing on the salad) - eat as little meat as possible - can try juicing, but should not forget that juicing will get rid of the fiber, so would alternate with eating raw veg./fruits or drinking smoothies - use as little oil as possible, even when using olive oil - can dress a salad with a mix of balsamic vinegar and lemon juice, for e.g. - use agave nectar, stevia sugar, or regular sugar rather than artificial sweateners - steam or broil/roast veggies  - snack on  veggies/fruit/nuts (unsalted, preferably) when possible, rather than processed foods - reduce or eliminate aspartame in diet (it is in diet sodas, chewing gum, etc) Read the labels!  Try to read Dr. Rosalynn Points book: Program for Reversing Diabetes for other ideas for healthy eating.

## 2024-08-30 NOTE — Addendum Note (Signed)
 Addended by: CLEOTILDE ROLIN RAMAN on: 08/30/2024 12:58 PM   Modules accepted: Orders

## 2024-08-30 NOTE — Progress Notes (Signed)
 Patient ID: [name], female   DOB: [birth year], 89 y.o.   MRN: [id number]  HPI: [name] is a 89 y.o.-year-old female, self-referred (her daughter is also my patient), for management of DM2, dx in 1994, insulin -dependent, uncontrolled, with complications (CAD, s/p NSTEMI 2008; CHF; PAD; atrial fibrillation/atrial flutter; peripheral neuropathy; CKD stage IIIa).   Last visit with me 4 months ago.   Patient's caregiver accompanies her today and offers information about patient's diet, activity, and insulin  doses.  Interim history: + chest pain - on prn NTG; no shortness of breath, nausea.  Reviewed HbA1c: Lab Results  Component Value Date   HGBA1C 8.3 (A) 04/26/2024   HGBA1C 7.5 (H) 10/08/2023   HGBA1C 6.9 06/23/2023   HGBA1C 7.3 (A) 02/03/2023   HGBA1C 7.0 (A) 07/29/2022   HGBA1C 8.4 (A) 03/26/2022   HGBA1C 7.3 (A) 12/25/2021   HGBA1C 9.0 (A) 09/14/2021   HGBA1C 8.3 (A) 05/28/2021   HGBA1C 8.0 (A) 03/24/2021   HGBA1C 9.5 (A) 12/02/2020   HGBA1C 7.8 (A) 08/21/2020   HGBA1C 8.3 (H) 05/15/2020   HGBA1C 8.0 (A) 05/06/2020   HGBA1C 9.2 (H) 02/20/2020   HGBA1C 7.9 (A) 11/14/2019   HGBA1C 7.6 (A) 08/22/2019   HGBA1C 8.1 (A) 05/01/2019   HGBA1C 8.9 (H) 01/31/2019   HGBA1C 7.0 (A) 12/06/2018   HGBA1C 6.5 (A) 09/20/2018   HGBA1C 6.4 (A) 07/19/2018   HGBA1C 7.3 05/08/2018   HGBA1C 8.0 (H) 01/30/2018   HGBA1C 8.3 11/23/2017   HGBA1C 7.4 08/23/2017   HGBA1C 8.2 01/24/2017   HGBA1C 7.7 09/08/2016   HGBA1C 8.4 (H) 06/09/2016   HGBA1C 7.8 (H) 03/04/2016   HGBA1C 7.6 12/08/2015   HGBA1C 8.3 08/26/2015   HGBA1C 7.9 (H) 05/24/2015   HGBA1C 8.6 (H) 02/06/2015   HGBA1C 8.7 (H) 11/15/2014   HGBA1C 8.1 (H) 08/21/2014   HGBA1C 9.6 (H) 01/21/2014   HGBA1C 9.8 (H) 09/13/2013   HGBA1C 9.7 (H) 05/15/2013   HGBA1C 10.9 (H) 02/01/2013   She was previously on: - Humalog  75/25 >> 70/30 Novolin  (24$ at [facility], w/o Rx) 32 units before breakfast and 6 units before dinner >> .SABRASABRA 14  units 30 min before breakfast and 10-12 units 30 min before dinner - Ozempic  0.25 >> 0.5 mg weekly-added 08/2021 >> stopped 12/2021 2/2 $$ but now new insurance >> restarted (patient assistance)  Currently on: - Ozempic  0.5 >> 0.25 mg weekly 2/2 weight loss >> 0.5 mg weekly - >> stopped 2/2 lows 06/2023 - NovoLog  4-6 units 3x a day 10-15 min before meals >> at today's visit, she again mentions that she is taking this after meals Before b'fast:6 units  Before lunch: 8-10 units >> 4-6 >> 8 Units  Pt checks her sugars >4x a day and they are:  Previously:  Previously:   Lowest sugar was  45 >> 69 >> 49 (taking 15-20 units at that time) >> 70s; she has hypoglycemia awareness at 70.  Highest sugar was 497 (steroids) >>... 300s.  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  Pt's meals are: - Breakfast: frozen banana, berries, oatmeal, protein powder >> yogurt and fruit - dinner: Includes soup  But she has lunch and dinner in the cafeteria [facility] - facility: [place]):   -+ History of CKD stage IIIa, last BUN/creatinine:  Lab Results  Component Value Date   BUN 25 (H) 10/08/2023   BUN 26 (H) 08/30/2023   CREATININE 0.86 10/08/2023   CREATININE 1.13 08/30/2023   Lab Results  Component  Value Date   MICRALBCREAT 300 (H) 04/26/2024   MICRALBCREAT 7.3 10/28/2009   MICRALBCREAT 10.3 01/25/2008   MICRALBCREAT 31.5 (H) 10/13/2007  She is not on an ACE inhibitor/ARB  -+ HL; last set of lipids: Lab Results  Component Value Date   CHOL 315 (H) 01/18/2023   HDL 54.10 01/18/2023   LDLCALC 229 (H) 01/18/2023   LDLDIRECT 144.6 02/03/2010   TRIG 161.0 (H) 01/18/2023   CHOLHDL 6 01/18/2023  She had muscle weakness from statins.   - last eye exam was in 2022 or 2023: No DR reportedly.  - no numbness and tingling in her feet.  She saw podiatry, but not recently.  Last foot exam 03/09/2024.  Pt has FH of DM in mother.  She also has a history of HTN, osteoporosis,  protein-calorie malnutrition, iron deficiency anemia. No FH of MTC, no pancreatitis hx.  ROS: + See HPI  Past Medical History:  Diagnosis Date   Anemia    iron defficiency   Anginal pain (HCC)    Arthritis    knees, feet, hands; joints (05/27/2015)   Asthma    Atrial fibrillation or flutter    s/p RFCA 7/08;   s/p DCCV in past;   previously on amiodarone ;  amio stopped due to lung toxicity   CAD (coronary artery disease)    s/p NSTEMI tx with BMS to OM1 3/08;  cath 3/08: pOM 99% tx with PCI, pLAD 20%, ? mod stenosis at the AM   CAP (community acquired pneumonia) 05/24/2015   CHF (congestive heart failure) (HCC)    Chronic diastolic heart failure (HCC)    echo 11/11:  EF 55-60%, severe LVH, mod LAE, mild MR, mildly increased PASP   COPD (chronic obstructive pulmonary disease) (HCC)    Degenerative joint disease    Dystrophy, corneal stromal    Heart murmur    HLD (hyperlipidemia)    HTN (hypertension)    essential nos   Hypopotassemia    PMH of   Muscle pain    Myocardial infarction (HCC) 2008   OSA on CPAP    Osteoporosis    Pneumonia 05/27/2015   Protein calorie malnutrition (HCC)    Rash and nonspecific skin eruption    both arms,awaiting bio   dr robinson   Seasonal allergies    Shortness of breath dyspnea    Type II diabetes mellitus (HCC)    Dr Kassie   Past Surgical History:  Procedure Laterality Date   A FLUTTER ABLATION     Dr Waddell   ABDOMINAL AORTOGRAM W/LOWER EXTREMITY Bilateral 04/11/2020   Procedure: ABDOMINAL AORTOGRAM W/LOWER EXTREMITY;  Surgeon: Eliza Lonni RAMAN, MD;  Location[facility] INVASIVE CV LAB;  Service: Cardiovascular;  Laterality: Bilateral;   CATARACT EXTRACTION W/ INTRAOCULAR LENS  IMPLANT, BILATERAL Bilateral    CHOLECYSTECTOMY     COLONOSCOPY  6/07   2 polyps Dr. Cinda in [place]   colonoscopy with polypectomy      X 2; [facility]   CORNEAL TRANSPLANT Bilateral    CORONARY ANGIOPLASTY WITH STENT PLACEMENT  02/2007   BMS w/Dr Waddell    CORONARY ATHERECTOMY N/A 05/16/2020   Procedure: CORONARY ATHERECTOMY;  Surgeon: Swaziland, Peter M, MD;  Location: [facility] INVASIVE CV LAB;  Service: Cardiovascular;  Laterality: N/A;   CORONARY BALLOON ANGIOPLASTY N/A 05/16/2020   Procedure: CORONARY BALLOON ANGIOPLASTY;  Surgeon: Swaziland, Peter M, MD;  Location: [facility] INVASIVE CV LAB;  Service: Cardiovascular;  Laterality: N/A;   CORONARY STENT INTERVENTION N/A 05/16/2020  Procedure: CORONARY STENT INTERVENTION;  Surgeon: Swaziland, Peter M, MD;  Location: [facility] INVASIVE CV LAB;  Service: Cardiovascular;  Laterality: N/A;   CORONARY ULTRASOUND/IVUS N/A 05/16/2020   Procedure: Intravascular Ultrasound/IVUS;  Surgeon: Swaziland, Peter M, MD;  Location: [facility] INVASIVE CV LAB;  Service: Cardiovascular;  Laterality: N/A;   EYE SURGERY     gravid 2 para 2     KNEE CARTILAGE SURGERY Right    LEFT AND RIGHT HEART CATHETERIZATION WITH CORONARY ANGIOGRAM N/A 05/07/2014   Procedure: LEFT AND RIGHT HEART CATHETERIZATION WITH CORONARY ANGIOGRAM;  Surgeon: Peter M Swaziland, MD;  Location: [facility] CATH LAB;  Service: Cardiovascular;  Laterality: N/A;   PERIPHERAL VASCULAR INTERVENTION  04/11/2020   Procedure: PERIPHERAL VASCULAR INTERVENTION;  Surgeon: Eliza Lonni RAMAN, MD;  Location: [facility] INVASIVE CV LAB;  Service: Cardiovascular;;   RIGHT HEART CATH N/A 02/04/2021   Procedure: RIGHT HEART CATH;  Surgeon: Cherrie Toribio SAUNDERS, MD;  Location: [facility] INVASIVE CV LAB;  Service: Cardiovascular;  Laterality: N/A;   RIGHT/LEFT HEART CATH AND CORONARY ANGIOGRAPHY N/A 05/16/2020   Procedure: RIGHT/LEFT HEART CATH AND CORONARY ANGIOGRAPHY;  Surgeon: Cherrie Toribio SAUNDERS, MD;  Location: [facility] INVASIVE CV LAB;  Service: Cardiovascular;  Laterality: N/A;   TOTAL KNEE ARTHROPLASTY Right 06/28/2016   Procedure: TOTAL KNEE ARTHROPLASTY;  Surgeon: Dempsey Sensor, MD;  Location: [facility] OR;  Service: Orthopedics;  Laterality: Right;   Social History   Socioeconomic History   Marital status: Widowed    Spouse name: Not on  file   Number of children: 2   Years of education: 16   Highest education level: Bachelor's degree (e.g., BA, AB, BS)  Occupational History   Occupation: Teacher/ Retired  Tobacco Use   Smoking status: Former    Current packs/day: 0.00    Average packs/day: 0.3 packs/day for 18.0 years (4.5 ttl pk-yrs)    Types: Cigarettes    Start date: 12/27/1950    Quit date: 12/27/1968    Years since quitting: 55.7   Smokeless tobacco: Never   Tobacco comments:    smoked 1952- 1970, up to 1 pp week  Vaping Use   Vaping status: Never Used  Substance and Sexual Activity   Alcohol use: Yes    Comment: occ   Drug use: No   Sexual activity: Never  Other Topics Concern   Not on file  Social History Narrative   Teacher. Widowed. Rarely drinks cafeine.    Social Drivers of Corporate investment banker Strain: Low Risk  (09/08/2023)   Overall Financial Resource Strain (CARDIA)    Difficulty of Paying Living Expenses: Not hard at all  Food Insecurity: No Food Insecurity (10/14/2023)   Hunger Vital Sign    Worried About Running Out of Food in the Last Year: Never true    Ran Out of Food in the Last Year: Never true  Transportation Needs: No Transportation Needs (10/14/2023)   PRAPARE - Administrator, Civil Service (Medical): No    Lack of Transportation (Non-Medical): No  Physical Activity: Sufficiently Active (09/08/2023)   Exercise Vital Sign    Days of Exercise per Week: 5 days    Minutes of Exercise per Session: 30 min  Stress: No Stress Concern Present (09/08/2023)   [employer] of Occupational Health - Occupational Stress Questionnaire    Feeling of Stress : Not at all  Social Connections: Socially Integrated (09/08/2023)   Social Connection and Isolation Panel    Frequency of Communication with Friends and Family: More  than three times a week    Frequency of Social Gatherings with Friends and Family: More than three times a week    Attends Religious Services: More than 4  times per year    Active Member of [employer] or Organizations: Yes    Attends Engineer, structural: More than 4 times per year    Marital Status: Married  Catering manager Violence: Not At Risk (10/08/2023)   Humiliation, Afraid, Rape, and Kick questionnaire    Fear of Current or Ex-Partner: No    Emotionally Abused: No    Physically Abused: No    Sexually Abused: No   Current Outpatient Medications on File Prior to Visit  Medication Sig Dispense Refill   acetaminophen  (TYLENOL ) 325 MG tablet Take 2 tablets (650 mg total) by mouth every 6 (six) hours as needed for mild pain or headache. 20 tablet 0   albuterol  (VENTOLIN  HFA) 108 (90 Base) MCG/ACT inhaler Inhale 2 puffs into the lungs every 6 (six) hours as needed for wheezing or shortness of breath. 6.7 g 2   amLODipine  (NORVASC ) 5 MG tablet TAKE 1 TABLET BY MOUTH DAILY 90 tablet 3   apixaban  (ELIQUIS ) 5 MG TABS tablet Take 1 tablet (5 mg total) by mouth 2 (two) times daily. 180 tablet 3   Blood Glucose Monitoring Suppl (ONETOUCH VERIO) w/Device KIT Use as instructed to check blood sugar 1 kit 0   carvedilol  (COREG ) 6.25 MG tablet Take 1 tablet (6.25 mg total) by mouth 2 (two) times daily with a meal. 180 tablet 3   Continuous Glucose Receiver (FREESTYLE LIBRE 3 READER) DEVI Use to check glucose continuously. 1 each 0   Continuous Glucose Sensor (FREESTYLE LIBRE 3 PLUS SENSOR) MISC Inject 1 Device into the skin continuous. Change every 15 days 6 each 3   fluticasone  (FLONASE ) 50 MCG/ACT nasal spray Place 1 spray into both nostrils daily as needed for allergies or rhinitis. 16 g 3   glucose blood (ONETOUCH VERIO) test strip CHECK BLOOD SUGAR TWICE DAILY 200 strip 3   insulin  aspart (NOVOLOG  FLEXPEN) 100 UNIT/ML FlexPen Inject 4-6 Units into the skin 3 (three) times daily before meals. 15 mL 3   Insulin  Pen Needle (BD PEN NEEDLE NANO 2ND GEN) 32G X 4 MM MISC Use as directed 3 (three) times daily. 300 each 3   ipratropium-albuterol   (DUONEB) 0.5-2.5 (3) MG/3ML SOLN Take 3 mLs by nebulization every 6 (six) hours as needed. 360 mL 1   Lancet Devices (LANCING DEVICE) MISC Use as advised - for Delica Lancets 1 each 0   Lancets (ONETOUCH DELICA PLUS LANCET33G) MISC USE TO MONITOR GLUCOSE  LEVELS TWICE DAILY 200 each 3   Multiple Vitamins-Minerals (CENTRUM SILVER ULTRA WOMENS PO) Take 1 tablet by mouth daily.     nitroGLYCERIN  (NITROSTAT ) 0.4 MG SL tablet Place 1 tablet (0.4 mg total) under the tongue every 5 (five) minutes as needed for chest pain. 25 tablet 3   OXYGEN  Inhale 3 Doses into the lungs as needed. 3 liters     potassium chloride  SA (KLOR-CON  M) 20 MEQ tablet Take 2 tablets (40 mEq total) by mouth daily. 180 tablet 3   Semaglutide ,0.25 or 0.5MG /DOS, (OZEMPIC , 0.25 OR 0.5 MG/DOSE,) 2 MG/3ML SOPN Inject 0.5 mg into the skin once a week. 9 mL 3   Tiotropium Bromide -Olodaterol (STIOLTO RESPIMAT ) 2.5-2.5 MCG/ACT AERS Inhale 2 puffs into the lungs daily. 4 g 11   Tiotropium Bromide -Olodaterol (STIOLTO RESPIMAT ) 2.5-2.5 MCG/ACT AERS Inhale 2 puffs into the  lungs daily.     torsemide  (DEMADEX ) 20 MG tablet Take 2 tablets (40 mg total) by mouth 2 (two) times daily. 360 tablet 3   Vitamin D , Cholecalciferol , 25 MCG (1000 UT) CAPS Take 1 capsule by mouth daily.     No current facility-administered medications on file prior to visit.   Allergies  Allergen Reactions   Levaquin  [Levofloxacin ] Shortness Of Breath and Swelling    angioedema   Lobster [Shellfish Allergy] Other (See Comments)    angioedema   Penicillins Rash    Has patient had a PCN reaction causing immediate rash, facial/tongue/throat swelling, SOB or lightheadedness with hypotension: Yes Has patient had a PCN reaction causing severe rash involving mucus membranes or skin necrosis: No Has patient had a PCN reaction that required hospitalization: No Has patient had a PCN reaction occurring within the last 10 years: No If all of the above answers are NO, then may  proceed with Cephalosporin use.   Valsartan Other (See Comments)    REACTION: angioedema   Codeine Other (See Comments)    Mental status changes   Lipitor [Atorvastatin ] Other (See Comments)    weakness   Oxycodone  Other (See Comments)    hallucinations   Statins     Made her too weak   Tramadol    Amlodipine  Besylate Other (See Comments)    REACTION: tingling in lips & gum edema 7/12: talked to patient, states she is tolerating well   Crestor  [Rosuvastatin  Calcium ] Rash   Tramadol Hcl Nausea And Vomiting   Family History  Problem Relation Age of Onset   Diabetes Mother    Hypertension Mother    Transient ischemic attack Mother    Arthritis Mother    Heart attack Father 79   Arthritis Father    Hypertension Father    Breast cancer Maternal Aunt    Arthritis Maternal Aunt    PE: BP (!) 140/70   Pulse 82   Ht 5' 2 (1.575 m)   Wt 148 lb 3.2 oz (67.2 kg)   SpO2 93%   BMI 27.11 kg/m  Wt Readings from Last 10 Encounters:  08/30/24 148 lb 3.2 oz (67.2 kg)  08/21/24 148 lb (67.1 kg)  04/26/24 149 lb 9.6 oz (67.9 kg)  04/19/24 147 lb (66.7 kg)  01/03/24 149 lb 3.2 oz (67.7 kg)  11/01/23 152 lb (68.9 kg)  10/27/23 150 lb 12.8 oz (68.4 kg)  10/08/23 155 lb 11.2 oz (70.6 kg)  09/08/23 148 lb (67.1 kg)  08/30/23 148 lb (67.1 kg)   Constitutional: Normal weight, in NAD Eyes: EOMI, no exophthalmos ENT: no thyromegaly, no cervical lymphadenopathy Cardiovascular: RRR, No MRG, B pitting LE edema - wears compression hoses Respiratory: CTA B Musculoskeletal: no deformities Skin: + stasis dermatitis rash - bilateral lower legs Neurological: no tremor with outstretched hands . ASSESSMENT: 1. DM2, insulin -dependent, uncontrolled, with complications - CAD, s/p NSTEMI 2008 - CHF - PAD - h/o R stent - atrial fibrillation/atrial flutter -previously on amiodarone  - peripheral neuropathy - CKD stage IIIa  2. Obesity class 1  3. HL  PLAN:  1. Patient with longstanding,  uncontrolled, type 2 diabetes, on mealtime insulin  along with GLP-1 receptor agonist, with still poor control.  Latest HbA1c was higher, at 8.3% 4 months ago. - Reviewing the CGM trends at last visit sugars were improved in the previous several days after she started to take NovoLog  correctly, 15-20 min before meals.  She was interested in a simplified regimen but I did  not feel that we could safely do that at last visit.  We discussed about reducing dietary indiscretions, for example pancake with syrup, after which the sugars were quite high.  We did not change the regimen otherwise. CGM interpretation: -At today's visit, we reviewed her CGM downloads: It appears that 32% of values are in target range (goal >70%), while 68% are higher than 180 (goal <25%), and 0% are lower than 70 (goal <4%).  The calculated average blood sugar is 213.  The projected HbA1c for the next 3 months (GMI) is 8.4%. -Reviewing the CGM trends, sugars are again fluctuating in a very high range, above target most of the time.  They increase after every meal and drop more abruptly after lunch and less abruptly overnight.  Upon questioning, she again mentions that she is taking NovoLog  after meals and she forgot about the advice to take it 15 to 20 minutes before meals... This is likely the reason why the sugars are increasing after meals and then dropping in the late postprandial period.  She is currently not taking the NovoLog  before dinner, which is consistent with what I recommended at last visit, since it was unclear whether she needed it at that time and she was requesting a less complex regimen.  However, with the sugars increasing up to 300 after this meal, I recommended to take NovoLog  15 minutes before this meal, also. - The caregiver mentions the possibility of a sliding scale to simplify her regimen, but I feel that this would not lead to good control.  I would rather that she adjusts the dose of NovoLog  based on the size of  her meals, rather than the sugars before the meals.  In this regard, I did give them a more regimented dosing regimen but I again advised the patient that this is just a starting point, if she sees high blood sugars consistently after certain meal, she will need more insulin  for those meals.  Given several examples.  We also discussed about examples of small, regular, and large meals.  I explained that this categorizing does not necessarily relate to pure volume, but the amount of carbs in the meal. -Will continue the rest of the regimen for now. - I suggested to:  Patient Instructions  Please use the following regimen: - NovoLog  10-15 minutes before all 3 meals: Smaller meal: 6 units  Regular meal: 8 units Larger meal: 10 units - Ozempic  0.5 mg weekly  Please return in 4 months.  - we checked her HbA1c: 8.7% (higher) - advised to check sugars at different times of the day - 4x a day, rotating check times - advised for yearly eye exams >> she is UTD - at last visit, her urinary ACR was elevated, at 300.  Will repeat this today. - return to clinic in 3-4 months  2.  Obesity class I -At previous visits, I suggested to increase intake of fresh fruit, dried fruit, and other snacks.  I also recommended to try to switch from smoothies in the morning to oatmeal. - At the previous visit, we discussed about stopping protein shakes between meals - She lost 3 pounds before last visit and 1 pound since last visit - Will continue Ozempic  which should also help with weight loss  3. HL - Latest lipid panel was reviewed from 2024: LDL was very high: Lab Results  Component Value Date   CHOL 315 (H) 01/18/2023   HDL 54.10 01/18/2023   LDLCALC 229 (H) 01/18/2023  LDLDIRECT 144.6 02/03/2010   TRIG 161.0 (H) 01/18/2023   CHOLHDL 6 01/18/2023  - She could not tolerate statins due to muscle weakness -Options for treatment are ezetimibe  and evolocumab /alirocumab.  However, she is on palliative care,  with only mandatory medications.  We did discuss about improving diet.  She mentions that she is mostly eating smaller meals.  Lela Fendt, MD PhD [facility] Endocrinology

## 2024-08-31 ENCOUNTER — Ambulatory Visit: Payer: Self-pay | Admitting: Internal Medicine

## 2024-08-31 DIAGNOSIS — R2681 Unsteadiness on feet: Secondary | ICD-10-CM | POA: Diagnosis not present

## 2024-08-31 DIAGNOSIS — M25541 Pain in joints of right hand: Secondary | ICD-10-CM | POA: Diagnosis not present

## 2024-08-31 DIAGNOSIS — R2689 Other abnormalities of gait and mobility: Secondary | ICD-10-CM | POA: Diagnosis not present

## 2024-08-31 DIAGNOSIS — M6281 Muscle weakness (generalized): Secondary | ICD-10-CM | POA: Diagnosis not present

## 2024-08-31 DIAGNOSIS — R278 Other lack of coordination: Secondary | ICD-10-CM | POA: Diagnosis not present

## 2024-08-31 DIAGNOSIS — M25542 Pain in joints of left hand: Secondary | ICD-10-CM | POA: Diagnosis not present

## 2024-08-31 DIAGNOSIS — R531 Weakness: Secondary | ICD-10-CM | POA: Diagnosis not present

## 2024-08-31 LAB — MICROALBUMIN / CREATININE URINE RATIO
Creatinine, Urine: 110 mg/dL (ref 20–275)
Microalb Creat Ratio: 661 mg/g{creat} — ABNORMAL HIGH (ref <30)
Microalb, Ur: 72.7 mg/dL

## 2024-08-31 NOTE — Addendum Note (Signed)
 Addended by: TRIXIE FILE on: 08/31/2024 01:05 PM   Modules accepted: Orders

## 2024-09-02 DIAGNOSIS — J449 Chronic obstructive pulmonary disease, unspecified: Secondary | ICD-10-CM | POA: Diagnosis not present

## 2024-09-03 DIAGNOSIS — M25541 Pain in joints of right hand: Secondary | ICD-10-CM | POA: Diagnosis not present

## 2024-09-03 DIAGNOSIS — R2681 Unsteadiness on feet: Secondary | ICD-10-CM | POA: Diagnosis not present

## 2024-09-03 DIAGNOSIS — M6281 Muscle weakness (generalized): Secondary | ICD-10-CM | POA: Diagnosis not present

## 2024-09-03 DIAGNOSIS — R278 Other lack of coordination: Secondary | ICD-10-CM | POA: Diagnosis not present

## 2024-09-03 DIAGNOSIS — R2689 Other abnormalities of gait and mobility: Secondary | ICD-10-CM | POA: Diagnosis not present

## 2024-09-03 DIAGNOSIS — R531 Weakness: Secondary | ICD-10-CM | POA: Diagnosis not present

## 2024-09-03 DIAGNOSIS — M25542 Pain in joints of left hand: Secondary | ICD-10-CM | POA: Diagnosis not present

## 2024-09-04 ENCOUNTER — Ambulatory Visit: Admitting: Obstetrics

## 2024-09-04 DIAGNOSIS — R531 Weakness: Secondary | ICD-10-CM | POA: Diagnosis not present

## 2024-09-04 DIAGNOSIS — M25541 Pain in joints of right hand: Secondary | ICD-10-CM | POA: Diagnosis not present

## 2024-09-04 DIAGNOSIS — R2681 Unsteadiness on feet: Secondary | ICD-10-CM | POA: Diagnosis not present

## 2024-09-04 DIAGNOSIS — R278 Other lack of coordination: Secondary | ICD-10-CM | POA: Diagnosis not present

## 2024-09-04 DIAGNOSIS — M25542 Pain in joints of left hand: Secondary | ICD-10-CM | POA: Diagnosis not present

## 2024-09-04 DIAGNOSIS — R2689 Other abnormalities of gait and mobility: Secondary | ICD-10-CM | POA: Diagnosis not present

## 2024-09-04 DIAGNOSIS — M6281 Muscle weakness (generalized): Secondary | ICD-10-CM | POA: Diagnosis not present

## 2024-09-05 ENCOUNTER — Encounter: Payer: Self-pay | Admitting: Internal Medicine

## 2024-09-06 DIAGNOSIS — M6281 Muscle weakness (generalized): Secondary | ICD-10-CM | POA: Diagnosis not present

## 2024-09-06 DIAGNOSIS — R278 Other lack of coordination: Secondary | ICD-10-CM | POA: Diagnosis not present

## 2024-09-06 DIAGNOSIS — R531 Weakness: Secondary | ICD-10-CM | POA: Diagnosis not present

## 2024-09-06 DIAGNOSIS — M25542 Pain in joints of left hand: Secondary | ICD-10-CM | POA: Diagnosis not present

## 2024-09-06 DIAGNOSIS — R2689 Other abnormalities of gait and mobility: Secondary | ICD-10-CM | POA: Diagnosis not present

## 2024-09-06 DIAGNOSIS — M25541 Pain in joints of right hand: Secondary | ICD-10-CM | POA: Diagnosis not present

## 2024-09-06 DIAGNOSIS — R2681 Unsteadiness on feet: Secondary | ICD-10-CM | POA: Diagnosis not present

## 2024-09-11 ENCOUNTER — Ambulatory Visit (INDEPENDENT_AMBULATORY_CARE_PROVIDER_SITE_OTHER): Payer: Medicare Other

## 2024-09-11 VITALS — Ht 62.0 in | Wt 145.0 lb

## 2024-09-11 DIAGNOSIS — M25542 Pain in joints of left hand: Secondary | ICD-10-CM | POA: Diagnosis not present

## 2024-09-11 DIAGNOSIS — Z0000 Encounter for general adult medical examination without abnormal findings: Secondary | ICD-10-CM

## 2024-09-11 DIAGNOSIS — R2681 Unsteadiness on feet: Secondary | ICD-10-CM | POA: Diagnosis not present

## 2024-09-11 DIAGNOSIS — R278 Other lack of coordination: Secondary | ICD-10-CM | POA: Diagnosis not present

## 2024-09-11 DIAGNOSIS — M25541 Pain in joints of right hand: Secondary | ICD-10-CM | POA: Diagnosis not present

## 2024-09-11 DIAGNOSIS — M6281 Muscle weakness (generalized): Secondary | ICD-10-CM | POA: Diagnosis not present

## 2024-09-11 DIAGNOSIS — R531 Weakness: Secondary | ICD-10-CM | POA: Diagnosis not present

## 2024-09-11 DIAGNOSIS — R2689 Other abnormalities of gait and mobility: Secondary | ICD-10-CM | POA: Diagnosis not present

## 2024-09-11 NOTE — Patient Instructions (Signed)
 Ms. Kulakowski,  Thank you for taking the time for your Medicare Wellness Visit. I appreciate your continued commitment to your health goals. Please review the care plan we discussed, and feel free to reach out if I can assist you further.  Medicare recommends these wellness visits once per year to help you and your care team stay ahead of potential health issues. These visits are designed to focus on prevention, allowing your provider to concentrate on managing your acute and chronic conditions during your regular appointments.  Please note that Annual Wellness Visits do not include a physical exam. Some assessments may be limited, especially if the visit was conducted virtually. If needed, we may recommend a separate in-person follow-up with your provider.  Ongoing Care Seeing your primary care provider every 3 to 6 months helps us  monitor your health and provide consistent, personalized care.   Referrals If a referral was made during today's visit and you haven't received any updates within two weeks, please contact the referred provider directly to check on the status.  Recommended Screenings:  Health Maintenance  Topic Date Due   Zoster (Shingles) Vaccine (1 of 2) Never done   DTaP/Tdap/Td vaccine (2 - Tdap) 06/15/2020   Eye exam for diabetics  11/16/2022   Flu Shot  07/27/2024   COVID-19 Vaccine (4 - 2025-26 season) 08/27/2024   Hemoglobin A1C  02/27/2025   Complete foot exam   03/09/2025   Pneumococcal Vaccine for age over 24  Completed   HPV Vaccine  Aged Out   Meningitis B Vaccine  Aged Out   DEXA scan (bone density measurement)  Discontinued       09/11/2024    3:15 PM  Advanced Directives  Does Patient Have a Medical Advance Directive? No  Would patient like information on creating a medical advance directive? No - Patient declined   Advance Care Planning is important because it: Ensures you receive medical care that aligns with your values, goals, and  preferences. Provides guidance to your family and loved ones, reducing the emotional burden of decision-making during critical moments.  Vision: Annual vision screenings are recommended for early detection of glaucoma, cataracts, and diabetic retinopathy. These exams can also reveal signs of chronic conditions such as diabetes and high blood pressure.  Dental: Annual dental screenings help detect early signs of oral cancer, gum disease, and other conditions linked to overall health, including heart disease and diabetes.

## 2024-09-11 NOTE — Progress Notes (Signed)
 Subjective:   [name] is a 89 y.o. who presents for a Medicare Wellness preventive visit.  As a reminder, Annual Wellness Visits don't include a physical exam, and some assessments may be limited, especially if this visit is performed virtually. We may recommend an in-person follow-up visit with your provider if needed.  Visit Complete: Virtual I connected with  [name] on 09/11/24 by a audio enabled telemedicine application and verified that I am speaking with the correct person using two identifiers.  Patient Location: Home  Provider Location: Office/Clinic  I discussed the limitations of evaluation and management by telemedicine. The patient expressed understanding and agreed to proceed.  Vital Signs: Because this visit was a virtual/telehealth visit, some criteria may be missing or patient reported. Any vitals not documented were not able to be obtained and vitals that have been documented are patient reported.  VideoDeclined- This patient declined Librarian, academic. Therefore the visit was completed with audio only.  Persons Participating in Visit: Patient.  AWV Questionnaire: No: Patient Medicare AWV questionnaire was not completed prior to this visit.  Cardiac Risk Factors include: advanced age (>68men, >14 women);diabetes mellitus;dyslipidemia;hypertension;Other (see comment), Risk factor comments: A. Fib     Objective:    Today's Vitals   09/11/24 1515  Weight: 145 lb (65.8 kg)  Height: 5' 2 (1.575 m)   Body mass index is 26.52 kg/m.     09/11/2024    3:15 PM 10/08/2023    9:11 AM 09/08/2023    3:14 PM 09/06/2022    9:25 AM 09/03/2021    9:53 AM 03/10/2021   10:15 AM 01/31/2021    7:46 AM  Advanced Directives  Does Patient Have a Medical Advance Directive? No No No Yes Yes Yes No  Type of Advance Directive    Living will Healthcare Power of [place];Living will Healthcare Power of [place];Living will;Out of facility  DNR (pink MOST or yellow form)   Does patient want to make changes to medical advance directive?   No - Patient declined  No - Patient declined No - Patient declined   Copy of Healthcare Power of Attorney in Chart?     Yes - validated most recent copy scanned in chart (See row information)    Would patient like information on creating a medical advance directive? No - Patient declined No - Patient declined No - Patient declined    No - Patient declined    Current Medications (verified) Outpatient Encounter Medications as of 09/11/2024  Medication Sig   acetaminophen  (TYLENOL ) 325 MG tablet Take 2 tablets (650 mg total) by mouth every 6 (six) hours as needed for mild pain or headache.   albuterol  (VENTOLIN  HFA) 108 (90 Base) MCG/ACT inhaler Inhale 2 puffs into the lungs every 6 (six) hours as needed for wheezing or shortness of breath.   amLODipine  (NORVASC ) 5 MG tablet TAKE 1 TABLET BY MOUTH DAILY   apixaban  (ELIQUIS ) 5 MG TABS tablet Take 1 tablet (5 mg total) by mouth 2 (two) times daily.   Blood Glucose Monitoring Suppl (ONETOUCH VERIO) w/Device KIT Use as instructed to check blood sugar   carvedilol  (COREG ) 6.25 MG tablet Take 1 tablet (6.25 mg total) by mouth 2 (two) times daily with a meal.   Continuous Glucose Receiver (FREESTYLE LIBRE 3 READER) DEVI Use to check glucose continuously.   Continuous Glucose Sensor (FREESTYLE LIBRE 3 PLUS SENSOR) MISC Inject 1 Device into the skin continuous. Change every 15 days  fluticasone  (FLONASE ) 50 MCG/ACT nasal spray Place 1 spray into both nostrils daily as needed for allergies or rhinitis.   glucose blood (ONETOUCH VERIO) test strip CHECK BLOOD SUGAR TWICE DAILY   insulin  aspart (NOVOLOG  FLEXPEN) 100 UNIT/ML FlexPen Inject 4-6 Units into the skin 3 (three) times daily before meals.   Insulin  Pen Needle (BD PEN NEEDLE NANO 2ND GEN) 32G X 4 MM MISC Use as directed 3 (three) times daily.   ipratropium-albuterol  (DUONEB) 0.5-2.5 (3) MG/3ML SOLN Take 3  mLs by nebulization every 6 (six) hours as needed.   Lancet Devices (LANCING DEVICE) MISC Use as advised - for Delica Lancets   Lancets (ONETOUCH DELICA PLUS LANCET33G) MISC USE TO MONITOR GLUCOSE  LEVELS TWICE DAILY   Multiple Vitamins-Minerals (CENTRUM SILVER ULTRA WOMENS PO) Take 1 tablet by mouth daily.   nitroGLYCERIN  (NITROSTAT ) 0.4 MG SL tablet Place 1 tablet (0.4 mg total) under the tongue every 5 (five) minutes as needed for chest pain.   OXYGEN  Inhale 3 Doses into the lungs as needed. 3 liters   potassium chloride  SA (KLOR-CON  M) 20 MEQ tablet Take 2 tablets (40 mEq total) by mouth daily.   Semaglutide ,0.25 or 0.5MG /DOS, (OZEMPIC , 0.25 OR 0.5 MG/DOSE,) 2 MG/3ML SOPN Inject 0.5 mg into the skin once a week.   Tiotropium Bromide -Olodaterol (STIOLTO RESPIMAT ) 2.5-2.5 MCG/ACT AERS Inhale 2 puffs into the lungs daily.   Tiotropium Bromide -Olodaterol (STIOLTO RESPIMAT ) 2.5-2.5 MCG/ACT AERS Inhale 2 puffs into the lungs daily.   torsemide  (DEMADEX ) 20 MG tablet Take 2 tablets (40 mg total) by mouth 2 (two) times daily.   Vitamin D , Cholecalciferol , 25 MCG (1000 UT) CAPS Take 1 capsule by mouth daily.   No facility-administered encounter medications on file as of 09/11/2024.    Allergies (verified) Levaquin  [levofloxacin ], Lobster [shellfish allergy], Penicillins, Valsartan, Codeine, Lipitor [atorvastatin ], Oxycodone , Statins, Tramadol, Amlodipine  besylate, Crestor  [rosuvastatin  calcium ], and Tramadol hcl   History: Past Medical History:  Diagnosis Date   Anemia    iron defficiency   Anginal pain (HCC)    Arthritis    knees, feet, hands; joints (05/27/2015)   Asthma    Atrial fibrillation or flutter    s/p RFCA 7/08;   s/p DCCV in past;   previously on amiodarone ;  amio stopped due to lung toxicity   CAD (coronary artery disease)    s/p NSTEMI tx with BMS to OM1 3/08;  cath 3/08: pOM 99% tx with PCI, pLAD 20%, ? mod stenosis at the AM   CAP (community acquired pneumonia) 05/24/2015    CHF (congestive heart failure) (HCC)    Chronic diastolic heart failure (HCC)    echo 11/11:  EF 55-60%, severe LVH, mod LAE, mild MR, mildly increased PASP   COPD (chronic obstructive pulmonary disease) (HCC)    Degenerative joint disease    Dystrophy, corneal stromal    Heart murmur    HLD (hyperlipidemia)    HTN (hypertension)    essential nos   Hypopotassemia    PMH of   Muscle pain    Myocardial infarction (HCC) 2008   OSA on CPAP    Osteoporosis    Pneumonia 05/27/2015   Protein calorie malnutrition (HCC)    Rash and nonspecific skin eruption    both arms,awaiting bio   dr robinson   Seasonal allergies    Shortness of breath dyspnea    Type II diabetes mellitus (HCC)    Dr Kassie   Past Surgical History:  Procedure Laterality Date   A FLUTTER  ABLATION     Dr Waddell   ABDOMINAL AORTOGRAM W/LOWER EXTREMITY Bilateral 04/11/2020   Procedure: ABDOMINAL AORTOGRAM W/LOWER EXTREMITY;  Surgeon: Eliza Lonni RAMAN, MD;  Location: [facility] INVASIVE CV LAB;  Service: Cardiovascular;  Laterality: Bilateral;   CATARACT EXTRACTION W/ INTRAOCULAR LENS  IMPLANT, BILATERAL Bilateral    CHOLECYSTECTOMY     COLONOSCOPY  6/07   2 polyps Dr. Cinda in [place]   colonoscopy with polypectomy      X 2; [facility]   CORNEAL TRANSPLANT Bilateral    CORONARY ANGIOPLASTY WITH STENT PLACEMENT  02/2007   BMS w/Dr Waddell   CORONARY ATHERECTOMY N/A 05/16/2020   Procedure: CORONARY ATHERECTOMY;  Surgeon: Swaziland, Peter M, MD;  Location: [facility] INVASIVE CV LAB;  Service: Cardiovascular;  Laterality: N/A;   CORONARY BALLOON ANGIOPLASTY N/A 05/16/2020   Procedure: CORONARY BALLOON ANGIOPLASTY;  Surgeon: Swaziland, Peter M, MD;  Location: [facility] INVASIVE CV LAB;  Service: Cardiovascular;  Laterality: N/A;   CORONARY STENT INTERVENTION N/A 05/16/2020   Procedure: CORONARY STENT INTERVENTION;  Surgeon: Swaziland, Peter M, MD;  Location: [facility] INVASIVE CV LAB;  Service: Cardiovascular;  Laterality: N/A;   CORONARY ULTRASOUND/IVUS N/A  05/16/2020   Procedure: Intravascular Ultrasound/IVUS;  Surgeon: Swaziland, Peter M, MD;  Location: [facility] INVASIVE CV LAB;  Service: Cardiovascular;  Laterality: N/A;   EYE SURGERY     gravid 2 para 2     KNEE CARTILAGE SURGERY Right    LEFT AND RIGHT HEART CATHETERIZATION WITH CORONARY ANGIOGRAM N/A 05/07/2014   Procedure: LEFT AND RIGHT HEART CATHETERIZATION WITH CORONARY ANGIOGRAM;  Surgeon: Peter M Swaziland, MD;  Location: [facility] CATH LAB;  Service: Cardiovascular;  Laterality: N/A;   PERIPHERAL VASCULAR INTERVENTION  04/11/2020   Procedure: PERIPHERAL VASCULAR INTERVENTION;  Surgeon: Eliza Lonni RAMAN, MD;  Location: [facility] INVASIVE CV LAB;  Service: Cardiovascular;;   RIGHT HEART CATH N/A 02/04/2021   Procedure: RIGHT HEART CATH;  Surgeon: Cherrie Toribio SAUNDERS, MD;  Location: [facility] INVASIVE CV LAB;  Service: Cardiovascular;  Laterality: N/A;   RIGHT/LEFT HEART CATH AND CORONARY ANGIOGRAPHY N/A 05/16/2020   Procedure: RIGHT/LEFT HEART CATH AND CORONARY ANGIOGRAPHY;  Surgeon: Cherrie Toribio SAUNDERS, MD;  Location: [facility] INVASIVE CV LAB;  Service: Cardiovascular;  Laterality: N/A;   TOTAL KNEE ARTHROPLASTY Right 06/28/2016   Procedure: TOTAL KNEE ARTHROPLASTY;  Surgeon: Dempsey Sensor, MD;  Location: [facility] OR;  Service: Orthopedics;  Laterality: Right;   Family History  Problem Relation Age of Onset   Diabetes Mother    Hypertension Mother    Transient ischemic attack Mother    Arthritis Mother    Heart attack Father 2   Arthritis Father    Hypertension Father    Breast cancer Maternal Aunt    Arthritis Maternal Aunt    Social History   Socioeconomic History   Marital status: Widowed    Spouse name: Not on file   Number of children: 2   Years of education: 16   Highest education level: Bachelor's degree (e.g., BA, AB, BS)  Occupational History   Occupation: Teacher/ Retired  Tobacco Use   Smoking status: Former    Current packs/day: 0.00    Average packs/day: 0.3 packs/day for 18.0 years (4.5 ttl pk-yrs)     Types: Cigarettes    Start date: 12/27/1950    Quit date: 12/27/1968    Years since quitting: 55.7   Smokeless tobacco: Never   Tobacco comments:    smoked 1952- 1970, up to 1 pp week  Vaping Use  Vaping status: Never Used  Substance and Sexual Activity   Alcohol use: Yes    Alcohol/week: 1.0 standard drink of alcohol    Types: 1 Glasses of wine per week    Comment: occass   Drug use: No   Sexual activity: Not Currently  Other Topics Concern   Not on file  Social History Narrative   Teacher. Widowed. Rarely drinks cafeine.    Social Drivers of Corporate investment banker Strain: Low Risk  (09/11/2024)   Overall Financial Resource Strain (CARDIA)    Difficulty of Paying Living Expenses: Not hard at all  Food Insecurity: No Food Insecurity (09/11/2024)   Hunger Vital Sign    Worried About Running Out of Food in the Last Year: Never true    Ran Out of Food in the Last Year: Never true  Transportation Needs: No Transportation Needs (09/11/2024)   PRAPARE - Administrator, Civil Service (Medical): No    Lack of Transportation (Non-Medical): No  Physical Activity: Sufficiently Active (09/11/2024)   Exercise Vital Sign    Days of Exercise per Week: 5 days    Minutes of Exercise per Session: 30 min  Stress: No Stress Concern Present (09/11/2024)   [employer] of Occupational Health - Occupational Stress Questionnaire    Feeling of Stress: Not at all  Social Connections: Moderately Integrated (09/11/2024)   Social Connection and Isolation Panel    Frequency of Communication with Friends and Family: More than three times a week    Frequency of Social Gatherings with Friends and Family: More than three times a week    Attends Religious Services: More than 4 times per year    Active Member of [employer] or Organizations: Yes    Attends Banker Meetings: More than 4 times per year    Marital Status: Widowed    Tobacco Counseling Counseling given: No Tobacco  comments: smoked 1952- 1970, up to 1 pp week    Clinical Intake:  Pre-visit preparation completed: Yes  Pain : No/denies pain     BMI - recorded: 26.52 Nutritional Status: BMI 25 -29 Overweight Nutritional Risks: None Diabetes: Yes CBG done?: Yes CBG resulted in Enter/ Edit results?: Yes (fasting - 179) Did pt. bring in CBG monitor from home?: No  Lab Results  Component Value Date   HGBA1C 8.7 (A) 08/30/2024   HGBA1C 8.3 (A) 04/26/2024   HGBA1C 7.5 (H) 10/08/2023     How often do you need to have someone help you when you read instructions, pamphlets, or other written materials from your doctor or pharmacy?: 1 - Never  Interpreter Needed?: No  Information entered by :: Verdie Saba, CMA   Activities of Daily Living     09/11/2024    3:19 PM 10/08/2023    5:00 PM  In your present state of health, do you have any difficulty performing the following activities:  Hearing? 0 0  Vision? 0 0  Difficulty concentrating or making decisions? 0 0  Walking or climbing stairs? 0   Dressing or bathing? 0   Doing errands, shopping? 0 0  Preparing Food and eating ? N   Using the Toilet? N   In the past six months, have you accidently leaked urine? Y   Comment wears depends   Do you have problems with loss of bowel control? N   Managing your Medications? N   Managing your Finances? N   Housekeeping or managing your Housekeeping? N  Patient Care Team: Geofm Glade PARAS, MD as PCP - General (Internal Medicine) Mona Vinie BROCKS, MD as PCP - Cardiology (Cardiology) Bensimhon, Toribio SAUNDERS, MD as PCP - Advanced Heart Failure (Cardiology) Shellia Oh, MD as Consulting Physician (Pulmonary Disease) Kassie Mallick, MD (Inactive) as Consulting Physician (Endocrinology) Bensimhon, Toribio SAUNDERS, MD as Consulting Physician (Cardiology) Fate Morna SAILOR, [facility] (Inactive) as Pharmacist (Pharmacist) Leila Bound, OD (Optometry) Leila Bound, OD (Optometry)  I have updated your Care Teams  any recent Medical Services you may have received from other providers in the past year.     Assessment:   This is a routine wellness examination for [place].  Hearing/Vision screen Hearing Screening - Comments:: Denies hearing difficulties   Vision Screening - Comments:: Wears eyeglasses for reading only- up to date with routine eye exams with N. Hensel Eskir   Goals Addressed               This Visit's Progress     Patient Stated (pt-stated)        Patient stated she plans to continue watching her diet and manager her blood sugar levels       Depression Screen     09/11/2024    3:22 PM 08/21/2024    1:25 PM 09/08/2023    3:14 PM 08/30/2023    9:45 AM 01/18/2023   10:40 AM 09/06/2022    9:28 AM 03/29/2022    9:00 AM  PHQ 2/9 Scores  PHQ - 2 Score 0 0 0 0 0 0 1  PHQ- 9 Score 4 3 0  0      Fall Risk     09/11/2024    3:20 PM 08/21/2024    1:24 PM 01/03/2024   10:54 AM 09/08/2023    3:14 PM 08/30/2023   11:02 AM  Fall Risk   Falls in the past year? 0 0 0 0 0  Number falls in past yr: 0 0  0 0  Injury with Fall? 0 0  0 0  Risk for fall due to : No Fall Risks No Fall Risks  No Fall Risks No Fall Risks  Follow up Falls evaluation completed;Falls prevention discussed Falls evaluation completed  Falls prevention discussed Falls evaluation completed    MEDICARE RISK AT HOME:  Medicare Risk at Home Any stairs in or around the home?: No If so, are there any without handrails?: No Home free of loose throw rugs in walkways, pet beds, electrical cords, etc?: Yes Adequate lighting in your home to reduce risk of falls?: Yes Life alert?: No Use of a cane, walker or w/c?: Yes (walker) Grab bars in the bathroom?: Yes Shower chair or bench in shower?: Yes Elevated toilet seat or a handicapped toilet?: Yes  TIMED UP AND GO:  Was the test performed?  No  Cognitive Function: 6CIT completed    10/12/2019   12:44 PM 07/31/2018    9:39 AM  MMSE - Mini Mental State Exam  Not  completed: Refused   Orientation to time  5  Orientation to Place  5  Registration  3  Attention/ Calculation  5  Recall  2  Language- name 2 objects  2  Language- repeat  1  Language- follow 3 step command  3  Language- read & follow direction  1  Write a sentence  1  Copy design  1  Total score  29        09/11/2024    3:22 PM 09/08/2023    3:15  PM 09/06/2022    9:38 AM  6CIT Screen  What Year? 0 points 0 points 0 points  What month? 0 points 0 points 0 points  What time? 0 points 0 points 0 points  Count back from 20 0 points 0 points 0 points  Months in reverse 0 points 0 points 0 points  Repeat phrase 0 points 0 points 0 points  Total Score 0 points 0 points 0 points    Immunizations Immunization History  Administered Date(s) Administered   Fluad Quad(high Dose 65+) 08/22/2019, 10/07/2021, 10/01/2022   Fluad Trivalent(High Dose 65+) 10/11/2023   INFLUENZA, HIGH DOSE SEASONAL PF 10/14/2016, 10/10/2017, 09/28/2018   Influenza Split 11/16/2011, 09/26/2012   Influenza Whole 12/27/2004, 10/21/2008, 10/20/2009, 11/11/2010   Influenza,inj,Quad PF,6+ Mos 09/24/2013, 12/16/2014, 09/19/2015   Moderna Covid-19 Vaccine Bivalent Booster 86yrs & up 10/01/2022   Moderna SARS-COV2 Booster Vaccination 11/03/2020   Moderna Sars-Covid-2 Vaccination 02/08/2020, 03/10/2020   PPD Test 05/31/2015, 07/07/2016   Pneumococcal Conjugate-13 07/31/2018   Pneumococcal Polysaccharide-23 12/16/2014   Pneumococcal-Unspecified 05/31/2015   Td 06/15/2010    Screening Tests Health Maintenance  Topic Date Due   Zoster Vaccines- Shingrix (1 of 2) Never done   DTaP/Tdap/Td (2 - Tdap) 06/15/2020   OPHTHALMOLOGY EXAM  11/16/2022   Influenza Vaccine  07/27/2024   COVID-19 Vaccine (4 - 2025-26 season) 08/27/2024   HEMOGLOBIN A1C  02/27/2025   FOOT EXAM  03/09/2025   Pneumococcal Vaccine: 50+ Years  Completed   HPV VACCINES  Aged Out   Meningococcal B Vaccine  Aged Out   DEXA SCAN  Discontinued     Health Maintenance Items Addressed: 09/11/2024  Additional Screening:  Vision Screening: Recommended annual ophthalmology exams for early detection of glaucoma and other disorders of the eye. Is the patient up to date with their annual eye exam?  Yes  Who is the provider or what is the name of the office in which the patient attends annual eye exams? [facility]  Dental Screening: Recommended annual dental exams for proper oral hygiene  Community Resource Referral / Chronic Care Management: CRR required this visit?  No   CCM required this visit?  No   Plan:    I have personally reviewed and noted the following in the patient's chart:   Medical and social history Use of alcohol, tobacco or illicit drugs  Current medications and supplements including opioid prescriptions. Patient is not currently taking opioid prescriptions. Functional ability and status Nutritional status Physical activity Advanced directives List of other physicians Hospitalizations, surgeries, and ER visits in previous 12 months Vitals Screenings to include cognitive, depression, and falls Referrals and appointments  In addition, I have reviewed and discussed with patient certain preventive protocols, quality metrics, and best practice recommendations. A written personalized care plan for preventive services as well as general preventive health recommendations were provided to patient.   Verdie CHRISTELLA Saba, CMA   09/11/2024   After Visit Summary: (MyChart) Due to this being a telephonic visit, the after visit summary with patients personalized plan was offered to patient via MyChart   Notes: Scheduled a 6-mth Diabetes f/u w/PCP

## 2024-09-12 DIAGNOSIS — R2689 Other abnormalities of gait and mobility: Secondary | ICD-10-CM | POA: Diagnosis not present

## 2024-09-12 DIAGNOSIS — R278 Other lack of coordination: Secondary | ICD-10-CM | POA: Diagnosis not present

## 2024-09-12 DIAGNOSIS — R2681 Unsteadiness on feet: Secondary | ICD-10-CM | POA: Diagnosis not present

## 2024-09-12 DIAGNOSIS — R531 Weakness: Secondary | ICD-10-CM | POA: Diagnosis not present

## 2024-09-12 DIAGNOSIS — M6281 Muscle weakness (generalized): Secondary | ICD-10-CM | POA: Diagnosis not present

## 2024-09-12 DIAGNOSIS — M25542 Pain in joints of left hand: Secondary | ICD-10-CM | POA: Diagnosis not present

## 2024-09-12 DIAGNOSIS — J449 Chronic obstructive pulmonary disease, unspecified: Secondary | ICD-10-CM | POA: Diagnosis not present

## 2024-09-12 DIAGNOSIS — M25541 Pain in joints of right hand: Secondary | ICD-10-CM | POA: Diagnosis not present

## 2024-09-13 ENCOUNTER — Telehealth: Payer: Self-pay

## 2024-09-13 NOTE — Telephone Encounter (Signed)
 CMN for oxygen  received from [facility] and placed in Katie's folder for signature.

## 2024-09-16 ENCOUNTER — Other Ambulatory Visit (HOSPITAL_COMMUNITY): Payer: Self-pay

## 2024-09-17 ENCOUNTER — Other Ambulatory Visit: Payer: Self-pay

## 2024-09-17 DIAGNOSIS — R531 Weakness: Secondary | ICD-10-CM | POA: Diagnosis not present

## 2024-09-17 DIAGNOSIS — R278 Other lack of coordination: Secondary | ICD-10-CM | POA: Diagnosis not present

## 2024-09-17 DIAGNOSIS — R2681 Unsteadiness on feet: Secondary | ICD-10-CM | POA: Diagnosis not present

## 2024-09-17 DIAGNOSIS — R2689 Other abnormalities of gait and mobility: Secondary | ICD-10-CM | POA: Diagnosis not present

## 2024-09-17 DIAGNOSIS — M6281 Muscle weakness (generalized): Secondary | ICD-10-CM | POA: Diagnosis not present

## 2024-09-17 DIAGNOSIS — M25542 Pain in joints of left hand: Secondary | ICD-10-CM | POA: Diagnosis not present

## 2024-09-17 DIAGNOSIS — M25541 Pain in joints of right hand: Secondary | ICD-10-CM | POA: Diagnosis not present

## 2024-09-19 ENCOUNTER — Other Ambulatory Visit: Payer: Self-pay

## 2024-09-19 ENCOUNTER — Other Ambulatory Visit: Payer: Self-pay | Admitting: Nurse Practitioner

## 2024-09-19 ENCOUNTER — Other Ambulatory Visit (HOSPITAL_COMMUNITY): Payer: Self-pay

## 2024-09-19 MED ORDER — FLUTICASONE PROPIONATE 50 MCG/ACT NA SUSP
1.0000 | Freq: Every day | NASAL | 3 refills | Status: AC | PRN
  Filled 2024-09-19: qty 16, 60d supply, fill #0

## 2024-09-20 ENCOUNTER — Other Ambulatory Visit (HOSPITAL_BASED_OUTPATIENT_CLINIC_OR_DEPARTMENT_OTHER): Payer: Self-pay

## 2024-09-20 ENCOUNTER — Ambulatory Visit: Admitting: Podiatry

## 2024-09-20 VITALS — Ht 62.0 in | Wt 145.0 lb

## 2024-09-20 DIAGNOSIS — M25541 Pain in joints of right hand: Secondary | ICD-10-CM | POA: Diagnosis not present

## 2024-09-20 DIAGNOSIS — M25542 Pain in joints of left hand: Secondary | ICD-10-CM | POA: Diagnosis not present

## 2024-09-20 DIAGNOSIS — M79674 Pain in right toe(s): Secondary | ICD-10-CM | POA: Diagnosis not present

## 2024-09-20 DIAGNOSIS — R531 Weakness: Secondary | ICD-10-CM | POA: Diagnosis not present

## 2024-09-20 DIAGNOSIS — E1149 Type 2 diabetes mellitus with other diabetic neurological complication: Secondary | ICD-10-CM | POA: Diagnosis not present

## 2024-09-20 DIAGNOSIS — I739 Peripheral vascular disease, unspecified: Secondary | ICD-10-CM | POA: Diagnosis not present

## 2024-09-20 DIAGNOSIS — M79675 Pain in left toe(s): Secondary | ICD-10-CM | POA: Diagnosis not present

## 2024-09-20 DIAGNOSIS — B351 Tinea unguium: Secondary | ICD-10-CM | POA: Diagnosis not present

## 2024-09-20 DIAGNOSIS — R278 Other lack of coordination: Secondary | ICD-10-CM | POA: Diagnosis not present

## 2024-09-20 DIAGNOSIS — Z7901 Long term (current) use of anticoagulants: Secondary | ICD-10-CM | POA: Diagnosis not present

## 2024-09-20 DIAGNOSIS — R2689 Other abnormalities of gait and mobility: Secondary | ICD-10-CM | POA: Diagnosis not present

## 2024-09-20 DIAGNOSIS — M6281 Muscle weakness (generalized): Secondary | ICD-10-CM | POA: Diagnosis not present

## 2024-09-20 DIAGNOSIS — R2681 Unsteadiness on feet: Secondary | ICD-10-CM | POA: Diagnosis not present

## 2024-09-20 MED ORDER — PREGABALIN 50 MG PO CAPS
50.0000 mg | ORAL_CAPSULE | Freq: Two times a day (BID) | ORAL | 2 refills | Status: DC
  Filled 2024-09-20: qty 60, 30d supply, fill #0

## 2024-09-20 NOTE — Patient Instructions (Signed)
 Pregabalin  Capsules What is this medication? PREGABALIN  (pre GAB a lin) treats nerve pain. It may also be used to prevent and control seizures in people with epilepsy. It works by calming overactive nerves in your body. This medicine may be used for other purposes; ask your health care provider or pharmacist if you have questions. COMMON BRAND NAME(S): Lyrica  What should I tell my care team before I take this medication? They need to know if you have any of these conditions: Have or have had depression Heart failure Kidney disease Lung or breathing disease, such as asthma or COPD Substance use disorder Suicidal thoughts, plans, or attempt An unusual or allergic reaction to pregabalin , other medications, foods, dyes, or preservatives Pregnant or trying to get pregnant Breastfeeding How should I use this medication? Take this medication by mouth with water. Take it as directed on the prescription label at the same time every day. You can take it with or without food. If it upsets your stomach, take it with food. Keep taking it unless your care team tells you to stop. A special MedGuide will be given to you by the pharmacist with each prescription and refill. Be sure to read this information carefully each time. Talk to your care team about the use of this medication in children. While it may be prescribed for children as young as 1 month for selected conditions, precautions do apply. Overdosage: If you think you have taken too much of this medicine contact a poison control center or emergency room at once. NOTE: This medicine is only for you. Do not share this medicine with others. What if I miss a dose? If you miss a dose, take it as soon as you can. If it is almost time for your next dose, take only that dose. Do not take double or extra doses. What may interact with this medication? This medication may interact with the following: Alcohol Antihistamines for allergy, cough, and  cold Certain medications for anxiety or sleep Certain medications for blood pressure, heart disease Certain medications for depression like amitriptyline, fluoxetine, sertraline Certain medications for diabetes, like pioglitazone, rosiglitazone Certain medications for seizures like phenobarbital, primidone General anesthetics like halothane, isoflurane, methoxyflurane, propofol  Medications that relax muscles for surgery Opioid medications for pain Phenothiazines like chlorpromazine, mesoridazine, prochlorperazine, thioridazine This list may not describe all possible interactions. Give your health care provider a list of all the medicines, herbs, non-prescription drugs, or dietary supplements you use. Also tell them if you smoke, drink alcohol, or use illegal drugs. Some items may interact with your medicine. What should I watch for while using this medication? Visit your care team for regular checks on your progress. Tell your care team if your symptoms do not start to get better or if they get worse. Do not suddenly stop taking this medication. You may develop a severe reaction. Your care team will tell you how much medication to take. If your care team wants you to stop the medication, the dose may be slowly lowered over time to avoid any side effects. This medication may affect your coordination, reaction time, or judgment. Do not drive or operate machinery until you know how this medication affects you. Sit up or stand slowly to reduce the risk of dizzy or fainting spells. Drinking alcohol with this medication can increase the risk of these side effects. Taking this medication with other CNS depressants can make you too sleepy. This can make it hard to breathe and stay awake. In some cases, it  can cause coma and death. CNS depressants include opioids, benzodiazepines, muscle relaxants, medications for sleep, and alcohol. Talk to your care team about all the medications, vitamins, and supplements  you take. They can tell you what is safe to take together. Call emergency services right away if you have slow or shallow breathing, feel dizzy or confused, or have trouble staying awake. This medication can cause new or worsening depression and increase the risk of suicidal thoughts and actions in a small number of people. This can happen while you are taking this medication or after stopping it. Talk to your care team right away if you have changes in mood or behavior or thoughts of self-harm or suicide. They can help you. If you take this medication for seizures, wear a medical ID bracelet or chain. Carry a card that describes your condition. List the medications and doses you take on the card. Talk to your care team if you may be pregnant. There are benefits and risks to taking medications during pregnancy. Your care team can help you find the option that works for you. What side effects may I notice from receiving this medication? Side effects that you should report to your care team as soon as possible: Allergic reactions or angioedema--skin rash, itching or hives, swelling of the face, eyes, lips, tongue, arms, or legs, trouble swallowing or breathing Change in vision Muscle injury--unusual weakness or fatigue, muscle pain, dark yellow or brown urine, decrease in amount of urine Thoughts of suicide or self-harm, worsening mood, feelings of depression Trouble breathing Unusual bruising or bleeding Unusual changes in mood or behavior in children after use such as trouble concentrating, hostility, or restlessness Side effects that usually do not require medical attention (report these to your care team if they continue or are bothersome): Blurry vision Dizziness Drowsiness Dry mouth Swelling of the ankles, hands, or feet Weight gain This list may not describe all possible side effects. Call your doctor for medical advice about side effects. You may report side effects to FDA at  1-800-FDA-1088. Where should I keep my medication? Keep out of the reach of children and pets. This medication can be abused. Keep it in a safe place to protect it from theft. Do not share it with anyone. It is only for you. Selling or giving away this medication is dangerous and against the law. Store at [employer] C (77 degrees F). Get rid of any unused medication after the expiration date. This medication may cause harm and death if it is taken by other adults, children, or pets. It is important to get rid of the medication as soon as you no longer need it, or it is expired. You can do this in two ways: Take the medication to a medication take-back program. Check with your pharmacy or law enforcement to find a location. If you cannot return the medication, check the label or package insert to see if the medication should be thrown out in the garbage or flushed down the toilet. If you are not sure, ask your care team. If it is safe to put it in the trash, take the medication out of the container. Mix the medication with cat litter, dirt, coffee grounds, or other unwanted substance. Seal the mixture in a bag or container. Put it in the trash. NOTE: This sheet is a summary. It may not cover all possible information. If you have questions about this medicine, talk to your doctor, pharmacist, or health care provider.  2025 [employer]vier/Gold  Standard (2024-04-30 00:00:00)

## 2024-09-22 ENCOUNTER — Other Ambulatory Visit (HOSPITAL_COMMUNITY): Payer: Self-pay

## 2024-09-23 NOTE — Progress Notes (Signed)
 Subjective:   Patient ID: [name], female   DOB: 89 y.o.   MRN: [id number]   HPI Chief Complaint  Patient presents with   Toe Pain    Rm 12 numbness in legs, feet, slight discolor in two toes Pt states tingling achy pain bin both legs. Patient is diabetic (A1C 8.0). Pt request nail trimming. Pt states discoloration in the right 2nd and 3rd toe.     89 year old female presents the above concerns.  She states her nails are thickened discolored and they do cause discomfort and they get elongated.  Again states that she has 43% circulation.  She gets burning to her legs and this does disrupt her sleep at night and causes pain during the day as well.  Does not report any open lesions.  She is on Eliquis    Objective:  Physical Exam  General: AAO x3, NAD  Dermatological: Nails are hypertrophic, dystrophic, brittle, discolored, elongated 10. No surrounding redness or drainage. Tenderness nails 1-5 bilaterally. No open lesions or pre-ulcerative lesions are identified today.  There is no extension of hyperpigmentation of the surrounding skin.  All the nails seem to the same color.  Vascular: Dorsalis Pedis artery and Posterior Tibial artery pedal pulses are decreased bilateral with immedate capillary fill time.  There is no pain with calf compression, swelling, warmth, erythema.   Neruologic: Sensation somewhat decreased with Semmes Weinstein monofilament.  Musculoskeletal: No other areas of discomfort.  Gait: Unassisted, Nonantalgic.       Assessment:   Onychomycosis, PAD     Plan:  Symptomatic onychomycosis -Nails debrided 10 without complications or bleeding.  PAD -Referral to vascular surgery - Daily foot inspection  Neuropathy -Could be some component of neuropathy as well.  Discussed treatment options.  Will start Lyrica  50 mg twice a day discussed side effects. -Daily foot inspection  Return in about 3 months (around 12/20/2024).  [name] DPM

## 2024-09-24 DIAGNOSIS — M25542 Pain in joints of left hand: Secondary | ICD-10-CM | POA: Diagnosis not present

## 2024-09-24 DIAGNOSIS — R278 Other lack of coordination: Secondary | ICD-10-CM | POA: Diagnosis not present

## 2024-09-24 DIAGNOSIS — M25541 Pain in joints of right hand: Secondary | ICD-10-CM | POA: Diagnosis not present

## 2024-09-24 DIAGNOSIS — R2689 Other abnormalities of gait and mobility: Secondary | ICD-10-CM | POA: Diagnosis not present

## 2024-09-24 DIAGNOSIS — R2681 Unsteadiness on feet: Secondary | ICD-10-CM | POA: Diagnosis not present

## 2024-09-24 DIAGNOSIS — M6281 Muscle weakness (generalized): Secondary | ICD-10-CM | POA: Diagnosis not present

## 2024-09-24 DIAGNOSIS — R531 Weakness: Secondary | ICD-10-CM | POA: Diagnosis not present

## 2024-09-26 ENCOUNTER — Encounter: Payer: Self-pay | Admitting: Nurse Practitioner

## 2024-09-26 ENCOUNTER — Ambulatory Visit: Admitting: Nurse Practitioner

## 2024-09-26 ENCOUNTER — Telehealth: Payer: Self-pay

## 2024-09-26 NOTE — Telephone Encounter (Signed)
 Attempted to call patient in regards to note in appointment,left message to call back

## 2024-10-02 DIAGNOSIS — J449 Chronic obstructive pulmonary disease, unspecified: Secondary | ICD-10-CM | POA: Diagnosis not present

## 2024-10-05 ENCOUNTER — Other Ambulatory Visit: Payer: Self-pay | Admitting: *Deleted

## 2024-10-05 DIAGNOSIS — I739 Peripheral vascular disease, unspecified: Secondary | ICD-10-CM

## 2024-10-08 ENCOUNTER — Ambulatory Visit: Payer: Self-pay

## 2024-10-08 NOTE — Telephone Encounter (Signed)
 Reason for Triage: patient believes she has a UTI, feels like she has to urinated but really doesnt no other symptoms, wants to know if she can get a prescription or needs to be seen first

## 2024-10-08 NOTE — Telephone Encounter (Signed)
 FYI Only or Action Required?: FYI only for provider.  Patient was last seen in primary care on 08/21/2024 by Geofm Glade PARAS, MD.  Called Nurse Triage reporting Urinary Frequency.  Symptoms began several days ago.  Interventions attempted: Nothing.  Symptoms are: stable.  Triage Disposition: See Physician Within 24 Hours  Patient/caregiver understands and will follow disposition?: Unsure  Rn offered appt with partner tomorrow, pt stated she'd like to do the Wednesday appt due to transportation.     Reason for Disposition  Urinating more frequently than usual (i.e., frequency) OR new-onset of the feeling of an urgent need to urinate (i.e., urgency)  Answer Assessment - Initial Assessment Questions 1. SYMPTOM: What's the main symptom you're concerned about? (e.g., frequency, incontinence)     Frequency and feels like it just dribbles 2. ONSET: When did the  frequency  start?     saturday 3. PAIN: Is there any pain? If Yes, ask: How bad is it? (Scale: 1-10; mild, moderate, severe)     no 4. CAUSE: What do you think is causing the symptoms?     Thinks UTI 5. OTHER SYMPTOMS: Do you have any other symptoms? (e.g., blood in urine, fever, flank pain, pain with urination)     no  Protocols used: Urinary Symptoms-A-AH

## 2024-10-08 NOTE — Telephone Encounter (Signed)
 Pt has appt scheduled.

## 2024-10-10 ENCOUNTER — Encounter: Payer: Self-pay | Admitting: Internal Medicine

## 2024-10-10 ENCOUNTER — Ambulatory Visit (INDEPENDENT_AMBULATORY_CARE_PROVIDER_SITE_OTHER): Admitting: Internal Medicine

## 2024-10-10 ENCOUNTER — Other Ambulatory Visit (HOSPITAL_COMMUNITY): Payer: Self-pay

## 2024-10-10 ENCOUNTER — Other Ambulatory Visit (HOSPITAL_BASED_OUTPATIENT_CLINIC_OR_DEPARTMENT_OTHER): Payer: Self-pay

## 2024-10-10 VITALS — BP 140/72 | HR 74 | Temp 98.1°F | Ht 62.0 in | Wt 145.0 lb

## 2024-10-10 DIAGNOSIS — E1122 Type 2 diabetes mellitus with diabetic chronic kidney disease: Secondary | ICD-10-CM

## 2024-10-10 DIAGNOSIS — M25542 Pain in joints of left hand: Secondary | ICD-10-CM | POA: Diagnosis not present

## 2024-10-10 DIAGNOSIS — Z794 Long term (current) use of insulin: Secondary | ICD-10-CM

## 2024-10-10 DIAGNOSIS — R2689 Other abnormalities of gait and mobility: Secondary | ICD-10-CM | POA: Diagnosis not present

## 2024-10-10 DIAGNOSIS — N1831 Chronic kidney disease, stage 3a: Secondary | ICD-10-CM | POA: Diagnosis not present

## 2024-10-10 DIAGNOSIS — R350 Frequency of micturition: Secondary | ICD-10-CM

## 2024-10-10 DIAGNOSIS — M25541 Pain in joints of right hand: Secondary | ICD-10-CM | POA: Diagnosis not present

## 2024-10-10 DIAGNOSIS — R278 Other lack of coordination: Secondary | ICD-10-CM | POA: Diagnosis not present

## 2024-10-10 DIAGNOSIS — M6281 Muscle weakness (generalized): Secondary | ICD-10-CM | POA: Diagnosis not present

## 2024-10-10 DIAGNOSIS — G479 Sleep disorder, unspecified: Secondary | ICD-10-CM

## 2024-10-10 MED ORDER — TRAZODONE HCL 50 MG PO TABS
25.0000 mg | ORAL_TABLET | Freq: Every evening | ORAL | 5 refills | Status: AC | PRN
  Filled 2024-10-10: qty 30, 30d supply, fill #0

## 2024-10-10 MED ORDER — CEPHALEXIN 500 MG PO CAPS
500.0000 mg | ORAL_CAPSULE | Freq: Two times a day (BID) | ORAL | 0 refills | Status: DC
  Filled 2024-10-10: qty 14, 7d supply, fill #0

## 2024-10-10 NOTE — Assessment & Plan Note (Signed)
 Chronic Has been taking tylenol  pm nightly - advised not to take due to concerns with affecting memory Will try trazodone  25-50 mg nightly - can titrate dose if needed

## 2024-10-10 NOTE — Assessment & Plan Note (Signed)
 Chronic Associated with chronic kidney disease Management per Dr. Trixie Lab Results  Component Value Date   HGBA1C 8.7 (A) 08/30/2024   On Ozempic  0.5 mg weekly, NovoLog   4-6 units TID before meals  Has some albinuria  - will recheck urine albumin /cr ration

## 2024-10-10 NOTE — Progress Notes (Signed)
 Subjective:    Patient ID: [name], female    DOB: 05/24/1931, 89 y.o.   MRN: [id number]      HPI [name] is here for  Chief Complaint  Patient presents with   Urinary Frequency    Symptoms started 5-7 days ago.    She has not had any fevers.   She states difficulty urinating at times, increased frequency but decreased urine output.  She denies any pain with urination, blood in urine or  abdominal pain   She does have a slightly decreased appetite, but her son did pass away from cancer.  She is having difficulty sleeping - has been taking tylenol  pm nightly.   Medications and allergies reviewed with patient and updated if appropriate.  Current Outpatient Medications on File Prior to Visit  Medication Sig Dispense Refill   acetaminophen  (TYLENOL ) 325 MG tablet Take 2 tablets (650 mg total) by mouth every 6 (six) hours as needed for mild pain or headache. 20 tablet 0   albuterol  (VENTOLIN  HFA) 108 (90 Base) MCG/ACT inhaler Inhale 2 puffs into the lungs every 6 (six) hours as needed for wheezing or shortness of breath. 6.7 g 2   amLODipine  (NORVASC ) 5 MG tablet TAKE 1 TABLET BY MOUTH DAILY 90 tablet 3   apixaban  (ELIQUIS ) 5 MG TABS tablet Take 1 tablet (5 mg total) by mouth 2 (two) times daily. 180 tablet 3   Blood Glucose Monitoring Suppl (ONETOUCH VERIO) w/Device KIT Use as instructed to check blood sugar 1 kit 0   carvedilol  (COREG ) 6.25 MG tablet Take 1 tablet (6.25 mg total) by mouth 2 (two) times daily with a meal. 180 tablet 3   Continuous Glucose Receiver (FREESTYLE LIBRE 3 READER) DEVI Use to check glucose continuously. 1 each 0   Continuous Glucose Sensor (FREESTYLE LIBRE 3 PLUS SENSOR) MISC Inject 1 Device into the skin continuous. Change every 15 days 6 each 3   fluticasone  (FLONASE ) 50 MCG/ACT nasal spray Place 1 spray into both nostrils daily as needed for allergies or rhinitis. 16 g 3   glucose blood (ONETOUCH VERIO) test strip CHECK BLOOD SUGAR TWICE  DAILY 200 strip 3   insulin  aspart (NOVOLOG  FLEXPEN) 100 UNIT/ML FlexPen Inject 4-6 Units into the skin 3 (three) times daily before meals. 15 mL 3   Insulin  Pen Needle (BD PEN NEEDLE NANO 2ND GEN) 32G X 4 MM MISC Use as directed 3 (three) times daily. 300 each 3   Lancet Devices (LANCING DEVICE) MISC Use as advised - for Delica Lancets 1 each 0   Lancets (ONETOUCH DELICA PLUS LANCET33G) MISC USE TO MONITOR GLUCOSE  LEVELS TWICE DAILY 200 each 3   Multiple Vitamins-Minerals (CENTRUM SILVER ULTRA WOMENS PO) Take 1 tablet by mouth daily.     nitroGLYCERIN  (NITROSTAT ) 0.4 MG SL tablet Place 1 tablet (0.4 mg total) under the tongue every 5 (five) minutes as needed for chest pain. 25 tablet 3   OXYGEN  Inhale 3 Doses into the lungs as needed. 3 liters     potassium chloride  SA (KLOR-CON  M) 20 MEQ tablet Take 2 tablets (40 mEq total) by mouth daily. 180 tablet 3   pregabalin  (LYRICA ) 50 MG capsule Take 1 capsule (50 mg total) by mouth 2 (two) times daily. 60 capsule 2   Semaglutide ,0.25 or 0.5MG /DOS, (OZEMPIC , 0.25 OR 0.5 MG/DOSE,) 2 MG/3ML SOPN Inject 0.5 mg into the skin once a week. 9 mL 3   Tiotropium Bromide -Olodaterol (STIOLTO RESPIMAT ) 2.5-2.5 MCG/ACT AERS Inhale 2  puffs into the lungs daily. 4 g 11   Tiotropium Bromide -Olodaterol (STIOLTO RESPIMAT ) 2.5-2.5 MCG/ACT AERS Inhale 2 puffs into the lungs daily.     torsemide  (DEMADEX ) 20 MG tablet Take 2 tablets (40 mg total) by mouth 2 (two) times daily. 360 tablet 3   Vitamin D , Cholecalciferol , 25 MCG (1000 UT) CAPS Take 1 capsule by mouth daily.     ipratropium-albuterol  (DUONEB) 0.5-2.5 (3) MG/3ML SOLN Take 3 mLs by nebulization every 6 (six) hours as needed. 360 mL 1   No current facility-administered medications on file prior to visit.    Review of Systems  Constitutional:  Positive for appetite change. Negative for fever.  Gastrointestinal:  Negative for abdominal pain and nausea.  Genitourinary:  Positive for decreased urine volume,  difficulty urinating and frequency. Negative for dysuria and hematuria.  Psychiatric/Behavioral:  Negative for sleep disturbance.        Objective:   Vitals:   10/10/24 1522  BP: (!) 140/72  Pulse: 74  Temp: 98.1 F (36.7 C)  SpO2: 94%   BP Readings from Last 3 Encounters:  10/10/24 (!) 140/72  08/30/24 (!) 140/70  08/21/24 126/60   Wt Readings from Last 3 Encounters:  10/10/24 145 lb (65.8 kg)  09/20/24 145 lb (65.8 kg)  09/11/24 145 lb (65.8 kg)   Body mass index is 26.52 kg/m.    Physical Exam Constitutional:      General: She is not in acute distress.    Appearance: Normal appearance. She is not ill-appearing.  HENT:     Head: Normocephalic and atraumatic.  Abdominal:     General: There is no distension.     Palpations: Abdomen is soft.     Tenderness: There is no abdominal tenderness.  Musculoskeletal:     Right lower leg: No edema.     Left lower leg: No edema.  Skin:    General: Skin is warm and dry.  Neurological:     Mental Status: She is alert.            Assessment & Plan:    See Problem List for Assessment and Plan of chronic medical problems.

## 2024-10-10 NOTE — Assessment & Plan Note (Signed)
 Chronic Stable Has evidence of albinuria - Dr Trixie mentioned having her see nephrology

## 2024-10-10 NOTE — Assessment & Plan Note (Signed)
 Acute Having difficulty urinating, frequency and decreased urine output Concern for uti Not able to give a sample Will empirically treat with keflex 500 mg bid x 7 days If symptoms do not resolve or worse she will bring in a sample - ua, ucx ordered

## 2024-10-10 NOTE — Patient Instructions (Addendum)
      Medications changes include :   trazodone  25-50 mg at night for sleep, keflex twice daily x 1 week      Return if symptoms worsen or fail to improve.

## 2024-10-11 DIAGNOSIS — M25541 Pain in joints of right hand: Secondary | ICD-10-CM | POA: Diagnosis not present

## 2024-10-11 DIAGNOSIS — M25542 Pain in joints of left hand: Secondary | ICD-10-CM | POA: Diagnosis not present

## 2024-10-11 DIAGNOSIS — M6281 Muscle weakness (generalized): Secondary | ICD-10-CM | POA: Diagnosis not present

## 2024-10-11 DIAGNOSIS — R2689 Other abnormalities of gait and mobility: Secondary | ICD-10-CM | POA: Diagnosis not present

## 2024-10-11 DIAGNOSIS — R350 Frequency of micturition: Secondary | ICD-10-CM | POA: Diagnosis not present

## 2024-10-11 DIAGNOSIS — R278 Other lack of coordination: Secondary | ICD-10-CM | POA: Diagnosis not present

## 2024-10-11 LAB — URINALYSIS, ROUTINE W REFLEX MICROSCOPIC
Bilirubin Urine: NEGATIVE
Hgb urine dipstick: NEGATIVE
Ketones, ur: NEGATIVE
Nitrite: NEGATIVE
RBC / HPF: NONE SEEN (ref 0–?)
Specific Gravity, Urine: 1.01 (ref 1.000–1.030)
Total Protein, Urine: 100 — AB
Urine Glucose: NEGATIVE
Urobilinogen, UA: 0.2 (ref 0.0–1.0)
pH: 6 (ref 5.0–8.0)

## 2024-10-11 LAB — MICROALBUMIN / CREATININE URINE RATIO
Creatinine,U: 72.4 mg/dL
Microalb Creat Ratio: 427.9 mg/g — ABNORMAL HIGH (ref 0.0–30.0)
Microalb, Ur: 31 mg/dL — ABNORMAL HIGH (ref 0.0–1.9)

## 2024-10-12 ENCOUNTER — Ambulatory Visit: Payer: Self-pay | Admitting: Internal Medicine

## 2024-10-13 LAB — URINE CULTURE: Result:: NO GROWTH

## 2024-10-15 DIAGNOSIS — R2689 Other abnormalities of gait and mobility: Secondary | ICD-10-CM | POA: Diagnosis not present

## 2024-10-15 DIAGNOSIS — M6281 Muscle weakness (generalized): Secondary | ICD-10-CM | POA: Diagnosis not present

## 2024-10-15 DIAGNOSIS — M25541 Pain in joints of right hand: Secondary | ICD-10-CM | POA: Diagnosis not present

## 2024-10-15 DIAGNOSIS — M25542 Pain in joints of left hand: Secondary | ICD-10-CM | POA: Diagnosis not present

## 2024-10-15 DIAGNOSIS — R278 Other lack of coordination: Secondary | ICD-10-CM | POA: Diagnosis not present

## 2024-10-17 ENCOUNTER — Other Ambulatory Visit: Payer: Self-pay

## 2024-10-17 ENCOUNTER — Other Ambulatory Visit (HOSPITAL_COMMUNITY): Payer: Self-pay

## 2024-10-18 DIAGNOSIS — M25542 Pain in joints of left hand: Secondary | ICD-10-CM | POA: Diagnosis not present

## 2024-10-18 DIAGNOSIS — M6281 Muscle weakness (generalized): Secondary | ICD-10-CM | POA: Diagnosis not present

## 2024-10-18 DIAGNOSIS — R2689 Other abnormalities of gait and mobility: Secondary | ICD-10-CM | POA: Diagnosis not present

## 2024-10-18 DIAGNOSIS — R278 Other lack of coordination: Secondary | ICD-10-CM | POA: Diagnosis not present

## 2024-10-18 DIAGNOSIS — M25541 Pain in joints of right hand: Secondary | ICD-10-CM | POA: Diagnosis not present

## 2024-10-18 NOTE — Progress Notes (Unsigned)
 Patient ID: [name], female   DOB: Feb 28, 1931, 89 y.o.   MRN: [id number]  Reason for Consult: No chief complaint on file.   Referred by Gershon Donnice SAUNDERS, DPM  Subjective:     HPI [name] is a 89 y.o. female presenting for evaluation of bilateral leg pain. ***  Past Medical History:  Diagnosis Date   Anemia    iron defficiency   Anginal pain    Arthritis    knees, feet, hands; joints (05/27/2015)   Asthma    Atrial fibrillation or flutter    s/p RFCA 7/08;   s/p DCCV in past;   previously on amiodarone ;  amio stopped due to lung toxicity   CAD (coronary artery disease)    s/p NSTEMI tx with BMS to OM1 3/08;  cath 3/08: pOM 99% tx with PCI, pLAD 20%, ? mod stenosis at the AM   CAP (community acquired pneumonia) 05/24/2015   CHF (congestive heart failure) (HCC)    Chronic diastolic heart failure (HCC)    echo 11/11:  EF 55-60%, severe LVH, mod LAE, mild MR, mildly increased PASP   COPD (chronic obstructive pulmonary disease) (HCC)    Degenerative joint disease    Dystrophy, corneal stromal    Heart murmur    HLD (hyperlipidemia)    HTN (hypertension)    essential nos   Hypopotassemia    PMH of   Muscle pain    Myocardial infarction (HCC) 2008   OSA on CPAP    Osteoporosis    Pneumonia 05/27/2015   Protein calorie malnutrition    Rash and nonspecific skin eruption    both arms,awaiting bio   dr robinson   Seasonal allergies    Shortness of breath dyspnea    Type II diabetes mellitus (HCC)    Dr Kassie   Family History  Problem Relation Age of Onset   Diabetes Mother    Hypertension Mother    Transient ischemic attack Mother    Arthritis Mother    Heart attack Father 107   Arthritis Father    Hypertension Father    Breast cancer Maternal Aunt    Arthritis Maternal Aunt    Past Surgical History:  Procedure Laterality Date   A FLUTTER ABLATION     Dr Waddell   ABDOMINAL AORTOGRAM W/LOWER EXTREMITY Bilateral 04/11/2020   Procedure:  ABDOMINAL AORTOGRAM W/LOWER EXTREMITY;  Surgeon: Eliza Lonni RAMAN, MD;  Location: [facility] INVASIVE CV LAB;  Service: Cardiovascular;  Laterality: Bilateral;   CATARACT EXTRACTION W/ INTRAOCULAR LENS  IMPLANT, BILATERAL Bilateral    CHOLECYSTECTOMY     COLONOSCOPY  6/07   2 polyps Dr. Cinda in [place]   colonoscopy with polypectomy      X 2; [facility]   CORNEAL TRANSPLANT Bilateral    CORONARY ANGIOPLASTY WITH STENT PLACEMENT  02/2007   BMS w/Dr Waddell   CORONARY ATHERECTOMY N/A 05/16/2020   Procedure: CORONARY ATHERECTOMY;  Surgeon: Swaziland, Peter M, MD;  Location: [facility] INVASIVE CV LAB;  Service: Cardiovascular;  Laterality: N/A;   CORONARY BALLOON ANGIOPLASTY N/A 05/16/2020   Procedure: CORONARY BALLOON ANGIOPLASTY;  Surgeon: Swaziland, Peter M, MD;  Location: [facility] INVASIVE CV LAB;  Service: Cardiovascular;  Laterality: N/A;   CORONARY STENT INTERVENTION N/A 05/16/2020   Procedure: CORONARY STENT INTERVENTION;  Surgeon: Swaziland, Peter M, MD;  Location: [facility] INVASIVE CV LAB;  Service: Cardiovascular;  Laterality: N/A;   CORONARY ULTRASOUND/IVUS N/A 05/16/2020   Procedure: Intravascular Ultrasound/IVUS;  Surgeon: Swaziland, Peter M,  MD;  Location: [facility] INVASIVE CV LAB;  Service: Cardiovascular;  Laterality: N/A;   EYE SURGERY     gravid 2 para 2     KNEE CARTILAGE SURGERY Right    LEFT AND RIGHT HEART CATHETERIZATION WITH CORONARY ANGIOGRAM N/A 05/07/2014   Procedure: LEFT AND RIGHT HEART CATHETERIZATION WITH CORONARY ANGIOGRAM;  Surgeon: Peter M Swaziland, MD;  Location: [facility] CATH LAB;  Service: Cardiovascular;  Laterality: N/A;   PERIPHERAL VASCULAR INTERVENTION  04/11/2020   Procedure: PERIPHERAL VASCULAR INTERVENTION;  Surgeon: Eliza Lonni RAMAN, MD;  Location: [facility] INVASIVE CV LAB;  Service: Cardiovascular;;   RIGHT HEART CATH N/A 02/04/2021   Procedure: RIGHT HEART CATH;  Surgeon: Cherrie Toribio SAUNDERS, MD;  Location: [facility] INVASIVE CV LAB;  Service: Cardiovascular;  Laterality: N/A;   RIGHT/LEFT HEART CATH AND  CORONARY ANGIOGRAPHY N/A 05/16/2020   Procedure: RIGHT/LEFT HEART CATH AND CORONARY ANGIOGRAPHY;  Surgeon: Cherrie Toribio SAUNDERS, MD;  Location: [facility] INVASIVE CV LAB;  Service: Cardiovascular;  Laterality: N/A;   TOTAL KNEE ARTHROPLASTY Right 06/28/2016   Procedure: TOTAL KNEE ARTHROPLASTY;  Surgeon: Dempsey Sensor, MD;  Location: [facility] OR;  Service: Orthopedics;  Laterality: Right;    Short Social History:  Social History   Tobacco Use   Smoking status: Former    Current packs/day: 0.00    Average packs/day: 0.3 packs/day for 18.0 years (4.5 ttl pk-yrs)    Types: Cigarettes    Start date: 12/27/1950    Quit date: 12/27/1968    Years since quitting: 55.8   Smokeless tobacco: Never   Tobacco comments:    smoked 1952- 1970, up to 1 pp week  Substance Use Topics   Alcohol use: Yes    Alcohol/week: 1.0 standard drink of alcohol    Types: 1 Glasses of wine per week    Comment: occass    Allergies  Allergen Reactions   Levaquin  [Levofloxacin ] Shortness Of Breath and Swelling    angioedema   Lobster [Shellfish Allergy] Other (See Comments)    angioedema   Penicillins Rash    Has patient had a PCN reaction causing immediate rash, facial/tongue/throat swelling, SOB or lightheadedness with hypotension: Yes Has patient had a PCN reaction causing severe rash involving mucus membranes or skin necrosis: No Has patient had a PCN reaction that required hospitalization: No Has patient had a PCN reaction occurring within the last 10 years: No If all of the above answers are NO, then may proceed with Cephalosporin use.   Valsartan Other (See Comments)    REACTION: angioedema   Codeine Other (See Comments)    Mental status changes   Lipitor [Atorvastatin ] Other (See Comments)    weakness   Oxycodone  Other (See Comments)    hallucinations   Statins     Made her too weak   Tramadol    Amlodipine  Besylate Other (See Comments)    REACTION: tingling in lips & gum edema 7/12: talked to patient, states she  is tolerating well   Crestor  [Rosuvastatin  Calcium ] Rash   Tramadol Hcl Nausea And Vomiting    Current Outpatient Medications  Medication Sig Dispense Refill   acetaminophen  (TYLENOL ) 325 MG tablet Take 2 tablets (650 mg total) by mouth every 6 (six) hours as needed for mild pain or headache. 20 tablet 0   albuterol  (VENTOLIN  HFA) 108 (90 Base) MCG/ACT inhaler Inhale 2 puffs into the lungs every 6 (six) hours as needed for wheezing or shortness of breath. 6.7 g 2   amLODipine  (NORVASC ) 5 MG tablet TAKE  1 TABLET BY MOUTH DAILY 90 tablet 3   apixaban  (ELIQUIS ) 5 MG TABS tablet Take 1 tablet (5 mg total) by mouth 2 (two) times daily. 180 tablet 3   Blood Glucose Monitoring Suppl (ONETOUCH VERIO) w/Device KIT Use as instructed to check blood sugar 1 kit 0   carvedilol  (COREG ) 6.25 MG tablet Take 1 tablet (6.25 mg total) by mouth 2 (two) times daily with a meal. 180 tablet 3   cephALEXin (KEFLEX) 500 MG capsule Take 1 capsule (500 mg total) by mouth 2 (two) times daily. 14 capsule 0   Continuous Glucose Receiver (FREESTYLE LIBRE 3 READER) DEVI Use to check glucose continuously. 1 each 0   Continuous Glucose Sensor (FREESTYLE LIBRE 3 PLUS SENSOR) MISC Inject 1 Device into the skin continuous. Change every 15 days 6 each 3   fluticasone  (FLONASE ) 50 MCG/ACT nasal spray Place 1 spray into both nostrils daily as needed for allergies or rhinitis. 16 g 3   glucose blood (ONETOUCH VERIO) test strip CHECK BLOOD SUGAR TWICE DAILY 200 strip 3   insulin  aspart (NOVOLOG  FLEXPEN) 100 UNIT/ML FlexPen Inject 4-6 Units into the skin 3 (three) times daily before meals. 15 mL 3   Insulin  Pen Needle (BD PEN NEEDLE NANO 2ND GEN) 32G X 4 MM MISC Use as directed 3 (three) times daily. 300 each 3   ipratropium-albuterol  (DUONEB) 0.5-2.5 (3) MG/3ML SOLN Take 3 mLs by nebulization every 6 (six) hours as needed. 360 mL 1   Lancet Devices (LANCING DEVICE) MISC Use as advised - for Delica Lancets 1 each 0   Lancets (ONETOUCH  DELICA PLUS LANCET33G) MISC USE TO MONITOR GLUCOSE  LEVELS TWICE DAILY 200 each 3   Multiple Vitamins-Minerals (CENTRUM SILVER ULTRA WOMENS PO) Take 1 tablet by mouth daily.     nitroGLYCERIN  (NITROSTAT ) 0.4 MG SL tablet Place 1 tablet (0.4 mg total) under the tongue every 5 (five) minutes as needed for chest pain. 25 tablet 3   OXYGEN  Inhale 3 Doses into the lungs as needed. 3 liters     potassium chloride  SA (KLOR-CON  M) 20 MEQ tablet Take 2 tablets (40 mEq total) by mouth daily. 180 tablet 3   pregabalin  (LYRICA ) 50 MG capsule Take 1 capsule (50 mg total) by mouth 2 (two) times daily. 60 capsule 2   Semaglutide ,0.25 or 0.5MG /DOS, (OZEMPIC , 0.25 OR 0.5 MG/DOSE,) 2 MG/3ML SOPN Inject 0.5 mg into the skin once a week. 9 mL 3   Tiotropium Bromide -Olodaterol (STIOLTO RESPIMAT ) 2.5-2.5 MCG/ACT AERS Inhale 2 puffs into the lungs daily. 4 g 11   Tiotropium Bromide -Olodaterol (STIOLTO RESPIMAT ) 2.5-2.5 MCG/ACT AERS Inhale 2 puffs into the lungs daily.     torsemide  (DEMADEX ) 20 MG tablet Take 2 tablets (40 mg total) by mouth 2 (two) times daily. 360 tablet 3   traZODone  (DESYREL ) 50 MG tablet Take 0.5-1 tablets (25-50 mg total) by mouth at bedtime as needed for sleep. 30 tablet 5   Vitamin D , Cholecalciferol , 25 MCG (1000 UT) CAPS Take 1 capsule by mouth daily.     No current facility-administered medications for this visit.    REVIEW OF SYSTEMS  All other systems were reviewed and are negative     Objective:  Objective   There were no vitals filed for this visit. There is no height or weight on file to calculate BMI.  Physical Exam General: no acute distress Abdomen: non-tender, no pulsatile mass*** Neuro: alert, no focal deficit Extremities: no edema, cyanosis or wounds*** Vascular:   Right: ***  Left: ***  Data: ABI *** Previously: Right 0.4 toe pressure absent, left 0.49 toe pressure 36     Assessment/Plan:   [name] is a 89 y.o. female with ***  Recommendations to  optimize cardiovascular risk: Abstinence from all tobacco products. Blood glucose control with goal A1c < 7%. Blood pressure control with goal blood pressure < 140/90 mmHg. Lipid reduction therapy with goal LDL-C <100 mg/dL  Aspirin  81mg  PO QD.  Atorvastatin  40-80mg  PO QD (or other high intensity statin therapy).   Norman GORMAN Serve MD [facility]

## 2024-10-19 ENCOUNTER — Ambulatory Visit (HOSPITAL_COMMUNITY)
Admission: RE | Admit: 2024-10-19 | Discharge: 2024-10-19 | Disposition: A | Discharge status: Home / Self Care | Source: Ambulatory Visit | Attending: Vascular Surgery | Admitting: Vascular Surgery

## 2024-10-19 ENCOUNTER — Ambulatory Visit (INDEPENDENT_AMBULATORY_CARE_PROVIDER_SITE_OTHER): Admitting: Vascular Surgery

## 2024-10-19 ENCOUNTER — Other Ambulatory Visit (HOSPITAL_COMMUNITY): Payer: Self-pay

## 2024-10-19 ENCOUNTER — Encounter: Payer: Self-pay | Admitting: Vascular Surgery

## 2024-10-19 ENCOUNTER — Other Ambulatory Visit (HOSPITAL_BASED_OUTPATIENT_CLINIC_OR_DEPARTMENT_OTHER): Payer: Self-pay

## 2024-10-19 VITALS — BP 151/59 | HR 66 | Temp 97.8°F

## 2024-10-19 DIAGNOSIS — L600 Ingrowing nail: Secondary | ICD-10-CM | POA: Diagnosis not present

## 2024-10-19 DIAGNOSIS — I83893 Varicose veins of bilateral lower extremities with other complications: Secondary | ICD-10-CM | POA: Diagnosis not present

## 2024-10-19 DIAGNOSIS — I5032 Chronic diastolic (congestive) heart failure: Secondary | ICD-10-CM | POA: Insufficient documentation

## 2024-10-19 DIAGNOSIS — I70222 Atherosclerosis of native arteries of extremities with rest pain, left leg: Secondary | ICD-10-CM | POA: Diagnosis not present

## 2024-10-19 DIAGNOSIS — I739 Peripheral vascular disease, unspecified: Secondary | ICD-10-CM | POA: Insufficient documentation

## 2024-10-19 DIAGNOSIS — I70221 Atherosclerosis of native arteries of extremities with rest pain, right leg: Secondary | ICD-10-CM

## 2024-10-19 DIAGNOSIS — M79672 Pain in left foot: Secondary | ICD-10-CM | POA: Diagnosis not present

## 2024-10-19 DIAGNOSIS — B351 Tinea unguium: Secondary | ICD-10-CM | POA: Diagnosis not present

## 2024-10-19 DIAGNOSIS — J449 Chronic obstructive pulmonary disease, unspecified: Secondary | ICD-10-CM

## 2024-10-19 DIAGNOSIS — E1140 Type 2 diabetes mellitus with diabetic neuropathy, unspecified: Secondary | ICD-10-CM | POA: Diagnosis not present

## 2024-10-19 DIAGNOSIS — M79671 Pain in right foot: Secondary | ICD-10-CM | POA: Diagnosis not present

## 2024-10-19 LAB — VAS US ABI WITH/WO TBI
Left ABI: 0.31
Right ABI: 0.57

## 2024-10-22 DIAGNOSIS — M25541 Pain in joints of right hand: Secondary | ICD-10-CM | POA: Diagnosis not present

## 2024-10-22 DIAGNOSIS — M25542 Pain in joints of left hand: Secondary | ICD-10-CM | POA: Diagnosis not present

## 2024-10-22 DIAGNOSIS — R278 Other lack of coordination: Secondary | ICD-10-CM | POA: Diagnosis not present

## 2024-10-22 DIAGNOSIS — R2689 Other abnormalities of gait and mobility: Secondary | ICD-10-CM | POA: Diagnosis not present

## 2024-10-22 DIAGNOSIS — M6281 Muscle weakness (generalized): Secondary | ICD-10-CM | POA: Diagnosis not present

## 2024-10-23 DIAGNOSIS — R2689 Other abnormalities of gait and mobility: Secondary | ICD-10-CM | POA: Diagnosis not present

## 2024-10-23 DIAGNOSIS — M25542 Pain in joints of left hand: Secondary | ICD-10-CM | POA: Diagnosis not present

## 2024-10-23 DIAGNOSIS — M6281 Muscle weakness (generalized): Secondary | ICD-10-CM | POA: Diagnosis not present

## 2024-10-23 DIAGNOSIS — M25541 Pain in joints of right hand: Secondary | ICD-10-CM | POA: Diagnosis not present

## 2024-10-23 DIAGNOSIS — R278 Other lack of coordination: Secondary | ICD-10-CM | POA: Diagnosis not present

## 2024-10-24 ENCOUNTER — Other Ambulatory Visit: Payer: Self-pay

## 2024-10-29 ENCOUNTER — Other Ambulatory Visit (HOSPITAL_BASED_OUTPATIENT_CLINIC_OR_DEPARTMENT_OTHER): Payer: Self-pay

## 2024-10-30 DIAGNOSIS — R278 Other lack of coordination: Secondary | ICD-10-CM | POA: Diagnosis not present

## 2024-10-30 DIAGNOSIS — M25541 Pain in joints of right hand: Secondary | ICD-10-CM | POA: Diagnosis not present

## 2024-10-30 DIAGNOSIS — M6281 Muscle weakness (generalized): Secondary | ICD-10-CM | POA: Diagnosis not present

## 2024-10-30 DIAGNOSIS — M25542 Pain in joints of left hand: Secondary | ICD-10-CM | POA: Diagnosis not present

## 2024-10-30 DIAGNOSIS — R2689 Other abnormalities of gait and mobility: Secondary | ICD-10-CM | POA: Diagnosis not present

## 2024-11-06 ENCOUNTER — Encounter (HOSPITAL_COMMUNITY): Admitting: Internal Medicine

## 2024-11-07 ENCOUNTER — Encounter (HOSPITAL_COMMUNITY): Payer: Self-pay | Admitting: Internal Medicine

## 2024-11-07 ENCOUNTER — Other Ambulatory Visit (HOSPITAL_COMMUNITY): Payer: Self-pay

## 2024-11-07 ENCOUNTER — Ambulatory Visit (HOSPITAL_COMMUNITY)
Admission: RE | Admit: 2024-11-07 | Discharge: 2024-11-07 | Disposition: A | Discharge status: Home / Self Care | Source: Ambulatory Visit | Attending: Internal Medicine | Admitting: Internal Medicine

## 2024-11-07 ENCOUNTER — Ambulatory Visit: Admitting: Nurse Practitioner

## 2024-11-07 VITALS — BP 170/60 | HR 62 | Wt 151.0 lb

## 2024-11-07 DIAGNOSIS — Z794 Long term (current) use of insulin: Secondary | ICD-10-CM | POA: Diagnosis not present

## 2024-11-07 DIAGNOSIS — Z66 Do not resuscitate: Secondary | ICD-10-CM | POA: Insufficient documentation

## 2024-11-07 DIAGNOSIS — I4892 Unspecified atrial flutter: Secondary | ICD-10-CM | POA: Insufficient documentation

## 2024-11-07 DIAGNOSIS — Z8249 Family history of ischemic heart disease and other diseases of the circulatory system: Secondary | ICD-10-CM | POA: Diagnosis not present

## 2024-11-07 DIAGNOSIS — I272 Pulmonary hypertension, unspecified: Secondary | ICD-10-CM | POA: Diagnosis not present

## 2024-11-07 DIAGNOSIS — R079 Chest pain, unspecified: Secondary | ICD-10-CM | POA: Diagnosis not present

## 2024-11-07 DIAGNOSIS — I5032 Chronic diastolic (congestive) heart failure: Secondary | ICD-10-CM | POA: Diagnosis not present

## 2024-11-07 DIAGNOSIS — Z87891 Personal history of nicotine dependence: Secondary | ICD-10-CM | POA: Insufficient documentation

## 2024-11-07 DIAGNOSIS — J9610 Chronic respiratory failure, unspecified whether with hypoxia or hypercapnia: Secondary | ICD-10-CM | POA: Diagnosis not present

## 2024-11-07 DIAGNOSIS — I5042 Chronic combined systolic (congestive) and diastolic (congestive) heart failure: Secondary | ICD-10-CM | POA: Diagnosis not present

## 2024-11-07 DIAGNOSIS — I4821 Permanent atrial fibrillation: Secondary | ICD-10-CM | POA: Diagnosis not present

## 2024-11-07 DIAGNOSIS — I110 Hypertensive heart disease with heart failure: Secondary | ICD-10-CM | POA: Insufficient documentation

## 2024-11-07 DIAGNOSIS — I739 Peripheral vascular disease, unspecified: Secondary | ICD-10-CM

## 2024-11-07 DIAGNOSIS — I252 Old myocardial infarction: Secondary | ICD-10-CM | POA: Diagnosis not present

## 2024-11-07 DIAGNOSIS — I2510 Atherosclerotic heart disease of native coronary artery without angina pectoris: Secondary | ICD-10-CM | POA: Insufficient documentation

## 2024-11-07 DIAGNOSIS — I10 Essential (primary) hypertension: Secondary | ICD-10-CM | POA: Diagnosis not present

## 2024-11-07 DIAGNOSIS — Z7901 Long term (current) use of anticoagulants: Secondary | ICD-10-CM | POA: Diagnosis not present

## 2024-11-07 DIAGNOSIS — Z7985 Long-term (current) use of injectable non-insulin antidiabetic drugs: Secondary | ICD-10-CM | POA: Diagnosis not present

## 2024-11-07 DIAGNOSIS — I482 Chronic atrial fibrillation, unspecified: Secondary | ICD-10-CM | POA: Insufficient documentation

## 2024-11-07 DIAGNOSIS — J4489 Other specified chronic obstructive pulmonary disease: Secondary | ICD-10-CM | POA: Diagnosis not present

## 2024-11-07 DIAGNOSIS — G4733 Obstructive sleep apnea (adult) (pediatric): Secondary | ICD-10-CM | POA: Insufficient documentation

## 2024-11-07 DIAGNOSIS — E1151 Type 2 diabetes mellitus with diabetic peripheral angiopathy without gangrene: Secondary | ICD-10-CM | POA: Insufficient documentation

## 2024-11-07 NOTE — Progress Notes (Signed)
 Advanced Heart Failure Clinic Note   Date:  11/07/2024   ID:  [name], DOB 08-19-1931, MRN [id number]  Location: Home  Provider location: [facility] Type of Visit: Established patient  PCP:  Geofm Glade [name], MD  Cardiologist:  Vinie JAYSON Maxcy, MD Primary HF: Jarika Robben  Chief Complaint: Heart Failure follow-up   History of Present Illness:  [name] is a 89 y.o. female with CAD s/p 1v stent, HTN, atrial fibrillation/A flutter and chronic prior systolic heart failure, (which was likely rate related) and diastolic heart failure. She was referred by Dr. Waddell for further evaluation of Pulmonary HTN.   Echo 2/22 EF 40-45%   Underwent R/L cath 05/07/14 Which showed stable CAD and significant PH with normal PVR.   In 4/21 saw Dr. Eliza and had LE angio with severe PAD underwent stenting of 70% R external iliac   Here for f/u with her daughter. Previously she enrolled with home Palliative Care and we cut back many of her meds. Now living in ALF. Gets tightness in her chest 2-3x/month. Can come on at any time not related to exertion. Resolves with 1 SL NTG. Also has been more SOB. No edema, orthopnea or PND. Follows with BP clinic at ALF and SBP in 120s    ReDS 31%  Cardiac studies:  Myoview  9/20  Nuclear stress EF: 56%. There was no ST segment deviation noted during stress. Defect 1: There is a medium defect of moderate severity present in the mid inferolateral, apical inferior and apical lateral location. Findings consistent with possible mild ischemia in the apical inferior and inferolateral regions. The left ventricular ejection fraction is normal (55-65%).    Admitted in 5/21 with NSTEMI hstrop peak 1,455. Echo 5/21: EF 50-55%  Cath 05/16/20 with  1. Severe 3 vessel obstructive CAD 2. Severe pulmonary HTN 3. Successful PCI of the proximal LCx/OM1 with DES x 1 with IVUS guidance 4. Successful PCI of the proximal LAD with  orbital atherectomy and DES x 1  RA = 6 RV = 75/6 PA = 77/26 (45) PCW = 10 Fick cardiac output/index = 4.0/2.2 PVR = 8.8 WU FA sat = 99% PA sat = 67%, 73%  Admitted 2/22 with volume overload. Diuresed. Repeat RHC on 02/04/21 as below. Diuresed and started on sildenafil . D/c weight 167  RA = 6 RV = 88/5 PA =  86/33 (53) PCW = 21 Fick cardiac output/index = 3.4/1.9 PVR = 9.4 WU Ao sat = 97% PA sat = 64%, 64% PAPi = 8.8    Past Medical History:  Diagnosis Date   Anemia    iron defficiency   Anginal pain    Arthritis    knees, feet, hands; joints (05/27/2015)   Asthma    Atrial fibrillation or flutter    s/p RFCA 7/08;   s/p DCCV in past;   previously on amiodarone ;  amio stopped due to lung toxicity   CAD (coronary artery disease)    s/p NSTEMI tx with BMS to OM1 3/08;  cath 3/08: pOM 99% tx with PCI, pLAD 20%, ? mod stenosis at the AM   CAP (community acquired pneumonia) 05/24/2015   CHF (congestive heart failure) (HCC)    Chronic diastolic heart failure (HCC)    echo 11/11:  EF 55-60%, severe LVH, mod LAE, mild MR, mildly increased PASP   COPD (chronic obstructive pulmonary disease) (HCC)    Degenerative joint disease    Dystrophy, corneal stromal  Heart murmur    HLD (hyperlipidemia)    HTN (hypertension)    essential nos   Hypopotassemia    PMH of   Muscle pain    Myocardial infarction (HCC) 2008   OSA on CPAP    Osteoporosis    Pneumonia 05/27/2015   Protein calorie malnutrition    Rash and nonspecific skin eruption    both arms,awaiting bio   dr robinson   Seasonal allergies    Shortness of breath dyspnea    Type II diabetes mellitus (HCC)    Dr Kassie   Past Surgical History:  Procedure Laterality Date   A FLUTTER ABLATION     Dr Waddell   ABDOMINAL AORTOGRAM W/LOWER EXTREMITY Bilateral 04/11/2020   Procedure: ABDOMINAL AORTOGRAM W/LOWER EXTREMITY;  Surgeon: Eliza Lonni RAMAN, MD;  Location: [facility] INVASIVE CV LAB;  Service: Cardiovascular;   Laterality: Bilateral;   CATARACT EXTRACTION W/ INTRAOCULAR LENS  IMPLANT, BILATERAL Bilateral    CHOLECYSTECTOMY     COLONOSCOPY  6/07   2 polyps Dr. Cinda in [place]   colonoscopy with polypectomy      X 2; [facility]   CORNEAL TRANSPLANT Bilateral    CORONARY ANGIOPLASTY WITH STENT PLACEMENT  02/2007   BMS w/Dr Waddell   CORONARY ATHERECTOMY N/A 05/16/2020   Procedure: CORONARY ATHERECTOMY;  Surgeon: Jordan, Peter M, MD;  Location: [facility] INVASIVE CV LAB;  Service: Cardiovascular;  Laterality: N/A;   CORONARY BALLOON ANGIOPLASTY N/A 05/16/2020   Procedure: CORONARY BALLOON ANGIOPLASTY;  Surgeon: Jordan, Peter M, MD;  Location: [facility] INVASIVE CV LAB;  Service: Cardiovascular;  Laterality: N/A;   CORONARY STENT INTERVENTION N/A 05/16/2020   Procedure: CORONARY STENT INTERVENTION;  Surgeon: Jordan, Peter M, MD;  Location: [facility] INVASIVE CV LAB;  Service: Cardiovascular;  Laterality: N/A;   CORONARY ULTRASOUND/IVUS N/A 05/16/2020   Procedure: Intravascular Ultrasound/IVUS;  Surgeon: Jordan, Peter M, MD;  Location: [facility] INVASIVE CV LAB;  Service: Cardiovascular;  Laterality: N/A;   EYE SURGERY     gravid 2 para 2     KNEE CARTILAGE SURGERY Right    LEFT AND RIGHT HEART CATHETERIZATION WITH CORONARY ANGIOGRAM N/A 05/07/2014   Procedure: LEFT AND RIGHT HEART CATHETERIZATION WITH CORONARY ANGIOGRAM;  Surgeon: Peter M Jordan, MD;  Location: [facility] CATH LAB;  Service: Cardiovascular;  Laterality: N/A;   PERIPHERAL VASCULAR INTERVENTION  04/11/2020   Procedure: PERIPHERAL VASCULAR INTERVENTION;  Surgeon: Eliza Lonni RAMAN, MD;  Location: [facility] INVASIVE CV LAB;  Service: Cardiovascular;;   RIGHT HEART CATH N/A 02/04/2021   Procedure: RIGHT HEART CATH;  Surgeon: Cherrie Toribio SAUNDERS, MD;  Location: [facility] INVASIVE CV LAB;  Service: Cardiovascular;  Laterality: N/A;   RIGHT/LEFT HEART CATH AND CORONARY ANGIOGRAPHY N/A 05/16/2020   Procedure: RIGHT/LEFT HEART CATH AND CORONARY ANGIOGRAPHY;  Surgeon: Cherrie Toribio SAUNDERS, MD;   Location: [facility] INVASIVE CV LAB;  Service: Cardiovascular;  Laterality: N/A;   TOTAL KNEE ARTHROPLASTY Right 06/28/2016   Procedure: TOTAL KNEE ARTHROPLASTY;  Surgeon: Dempsey Sensor, MD;  Location: [facility] OR;  Service: Orthopedics;  Laterality: Right;     Current Outpatient Medications  Medication Sig Dispense Refill   albuterol  (VENTOLIN  HFA) 108 (90 Base) MCG/ACT inhaler Inhale 2 puffs into the lungs every 6 (six) hours as needed for wheezing or shortness of breath. 6.7 g 2   amLODipine  (NORVASC ) 5 MG tablet TAKE 1 TABLET BY MOUTH DAILY 90 tablet 3   apixaban  (ELIQUIS ) 5 MG TABS tablet Take 1 tablet (5 mg total) by mouth 2 (two) times daily.  180 tablet 3   Blood Glucose Monitoring Suppl (ONETOUCH VERIO) w/Device KIT Use as instructed to check blood sugar 1 kit 0   carvedilol  (COREG ) 6.25 MG tablet Take 1 tablet (6.25 mg total) by mouth 2 (two) times daily with a meal. 180 tablet 3   Continuous Glucose Receiver (FREESTYLE LIBRE 3 READER) DEVI Use to check glucose continuously. 1 each 0   Continuous Glucose Sensor (FREESTYLE LIBRE 3 PLUS SENSOR) MISC Inject 1 Device into the skin continuous. Change every 15 days 6 each 3   fluticasone  (FLONASE ) 50 MCG/ACT nasal spray Place 1 spray into both nostrils daily as needed for allergies or rhinitis. 16 g 3   glucose blood (ONETOUCH VERIO) test strip CHECK BLOOD SUGAR TWICE DAILY 200 strip 3   insulin  aspart (NOVOLOG  FLEXPEN) 100 UNIT/ML FlexPen Inject 4-6 Units into the skin 3 (three) times daily before meals. 15 mL 3   Insulin  Pen Needle (BD PEN NEEDLE NANO 2ND GEN) 32G X 4 MM MISC Use as directed 3 (three) times daily. 300 each 3   ipratropium-albuterol  (DUONEB) 0.5-2.5 (3) MG/3ML SOLN Take 3 mLs by nebulization every 6 (six) hours as needed. 360 mL 1   Lancet Devices (LANCING DEVICE) MISC Use as advised - for Delica Lancets 1 each 0   Lancets (ONETOUCH DELICA PLUS LANCET33G) MISC USE TO MONITOR GLUCOSE  LEVELS TWICE DAILY 200 each 3   Multiple  Vitamins-Minerals (CENTRUM SILVER ULTRA WOMENS PO) Take 1 tablet by mouth daily.     nitroGLYCERIN  (NITROSTAT ) 0.4 MG SL tablet Place 1 tablet (0.4 mg total) under the tongue every 5 (five) minutes as needed for chest pain. 25 tablet 3   OXYGEN  Inhale 3 Doses into the lungs as needed. 3 liters     potassium chloride  SA (KLOR-CON  M) 20 MEQ tablet Take 2 tablets (40 mEq total) by mouth daily. 180 tablet 3   pregabalin  (LYRICA ) 50 MG capsule Take 50 mg by mouth daily.     Semaglutide ,0.25 or 0.5MG /DOS, (OZEMPIC , 0.25 OR 0.5 MG/DOSE,) 2 MG/3ML SOPN Inject 0.5 mg into the skin once a week. 9 mL 3   Tiotropium Bromide -Olodaterol (STIOLTO RESPIMAT ) 2.5-2.5 MCG/ACT AERS Inhale 2 puffs into the lungs daily. 4 g 11   torsemide  (DEMADEX ) 20 MG tablet Take 2 tablets (40 mg total) by mouth 2 (two) times daily. 360 tablet 3   traZODone  (DESYREL ) 50 MG tablet Take 0.5-1 tablets (25-50 mg total) by mouth at bedtime as needed for sleep. 30 tablet 5   Vitamin D , Cholecalciferol , 25 MCG (1000 UT) CAPS Take 1 capsule by mouth daily.     pregabalin  (LYRICA ) 50 MG capsule Take 1 capsule (50 mg total) by mouth 2 (two) times daily. (Patient not taking: Reported on 11/07/2024) 60 capsule 2   No current facility-administered medications for this encounter.    Allergies:   Levaquin  [levofloxacin ], Lobster [shellfish allergy], Penicillins, Valsartan, Codeine, Lipitor [atorvastatin ], Oxycodone , Statins, Tramadol, Amlodipine  besylate, Crestor  [rosuvastatin  calcium ], and Tramadol hcl   Social History:  The patient  reports that she quit smoking about 55 years ago. Her smoking use included cigarettes. She started smoking about 73 years ago. She has a 4.5 pack-year smoking history. She has never used smokeless tobacco. She reports current alcohol use of about 1.0 standard drink of alcohol per week. She reports that she does not use drugs.   Family History:  The patient's family history includes Arthritis in her father, maternal  aunt, and mother; Breast cancer in her maternal aunt; Diabetes  in her mother; Heart attack (age of onset: 50) in her father; Hypertension in her father and mother; Transient ischemic attack in her mother.   ROS:  Please see the history of present illness.   All other systems are personally reviewed and negative.   Vitals:   11/07/24 1048  BP: (!) 170/60  Pulse: 62  SpO2: 92%  Weight: 68.5 kg (151 lb)    Wt Readings from Last 3 Encounters:  11/07/24 68.5 kg (151 lb)  10/10/24 65.8 kg (145 lb)  09/20/24 65.8 kg (145 lb)   Exam:   General:  Sitting up. Looks younger than her age. No resp difficulty HEENT: normal Neck: supple. no JVD.  Cor: Irregular rate & rhythm. No rubs, gallops or murmurs. Lungs: clear Abdomen: soft, nontender, nondistended.Good bowel sounds. Extremities: no cyanosis, clubbing, rash, edema Neuro: alert & orientedx3, cranial nerves grossly intact. moves all 4 extremities w/o difficulty. Affect pleasant  ReDS: 30%  ECG: AF 62 LVH. Nonspecific STs Personally reviewed   Recent Labs: No results found for requested labs within last 365 days.    Wt Readings from Last 3 Encounters:  11/07/24 68.5 kg (151 lb)  10/10/24 65.8 kg (145 lb)  09/20/24 65.8 kg (145 lb)     ASSESSMENT AND PLAN:   1) Chest pain - pattern does not fit angina - volume ok so unlikely HF  - she is not interested in any further procedures.  - if pain becomes more frequent or sound more anginal in nature can start daily nitrates  2) Chronic diastolic CHF:  - Echo 02/2015 EF 60-65%, LA severely dilated, Echo 10/2017: EF 65-70%, grade 3 DD - Echo 5/21 EF 50-55% - Echo 2/22 EF 40-45% - She is SOB after recent PNA. Now NYHA II-III - Volume status ok, ReDS 30% - Continue torsemide  40 bid - Not on Entresto due to h/o angioedema with ARB - Not interested in further HF med titration.   3) CAD - H/o of stent in 2008 - NSTEMI 5/21. Cath as above. S/p PCI to LAD & OM - Management of CP as  above - On Eliquis   - Unable to tolerate statins. Was on Repatha /zetia  per [facility] but she stopped because she wanted to limit her meds. -No change  4) PAD - severe  - s/p stenting to R external iliac with Dr. Eliza - No claudication currently  5) PAH - Moderate to severe by cath 5/21 and 2/22.  - suspect multifactorial  - she stopped sildenafil  due to side effects - no change  6) Chronic atrial fib/flutter - Rate controlled This patients CHA2DS2-VASc Score and unadjusted Ischemic Stroke Rate (% per year) is equal to 7.2 % stroke rate/year from a score of 5 Above score calculated as 2 points each if present [Age > 75, or Stroke/TIA/TE] - Continue Eliquis . No bleeding   7) OSA/COPD with chronic respiratory failure - Now off CPAP. - remains on home O2   8) HTN - Blood pressure high here  bbut well controlled at home  9) DM2 - Stopped Jardiance  due to cost  - No change  10) DNR/DNI - continue Palliative Care support  - No change. We confirmed today  Signed, Toribio Fuel, MD  11/07/2024 11:05 AM  [facility] [address] [facility] [place] [address] [phone] (office) [phone] (fax)

## 2024-11-07 NOTE — Patient Instructions (Signed)
 Medication Changes:  None, continue current medications   Special Instructions // Education:  Do the following things EVERYDAY: Weigh yourself in the morning before breakfast. Write it down and keep it in a log. Take your medicines as prescribed Eat low salt foods--Limit salt (sodium) to 2000 mg per day.  Stay as active as you can everyday Limit all fluids for the day to less than 2 liters   Follow-Up in: 6 months (May 2026), **PLEASE CALL OUR OFFICE IN MARCH TO SCHEDULE THIS APPOINTMENT   At the [facility], you and your health needs are our priority. We have a designated team specialized in the treatment of Heart Failure. This Care Team includes your primary Heart Failure Specialized Cardiologist (physician), Advanced Practice Providers (APPs- Physician Assistants and Nurse Practitioners), and Pharmacist who all work together to provide you with the care you need, when you need it.   You may see any of the following providers on your designated Care Team at your next follow up:  Dr. Toribio Fuel Dr. Ezra Shuck Dr. Odis Brownie Greig Mosses, NP Caffie Shed, [place] [facility] [place], [place] Beckey Coe, NP Jordan Lee, NP Tinnie Redman, PharmD   Please be sure to bring in all your medications bottles to every appointment.   Need to Contact Us :  If you have any questions or concerns before your next appointment please send us  a message through [place] or call our office at [phone].    TO LEAVE A MESSAGE FOR THE NURSE SELECT OPTION 2, PLEASE LEAVE A MESSAGE INCLUDING: YOUR NAME DATE OF BIRTH CALL BACK NUMBER REASON FOR CALL**this is important as we prioritize the call backs  YOU WILL RECEIVE A CALL BACK THE SAME DAY AS LONG AS YOU CALL BEFORE 4:00 PM

## 2024-11-07 NOTE — Progress Notes (Signed)
 ReDS Vest / Clip - 11/07/24 1100       ReDS Vest / Clip   Station Marker A    Ruler Value 28    ReDS Value Range Low volume    ReDS Actual Value 30

## 2024-11-13 DIAGNOSIS — R2689 Other abnormalities of gait and mobility: Secondary | ICD-10-CM | POA: Diagnosis not present

## 2024-11-13 DIAGNOSIS — R278 Other lack of coordination: Secondary | ICD-10-CM | POA: Diagnosis not present

## 2024-11-13 DIAGNOSIS — M25542 Pain in joints of left hand: Secondary | ICD-10-CM | POA: Diagnosis not present

## 2024-11-13 DIAGNOSIS — M25541 Pain in joints of right hand: Secondary | ICD-10-CM | POA: Diagnosis not present

## 2024-11-13 DIAGNOSIS — M6281 Muscle weakness (generalized): Secondary | ICD-10-CM | POA: Diagnosis not present

## 2024-11-14 ENCOUNTER — Other Ambulatory Visit (HOSPITAL_COMMUNITY): Payer: Self-pay

## 2024-11-15 ENCOUNTER — Ambulatory Visit: Admitting: Nurse Practitioner

## 2024-11-15 ENCOUNTER — Encounter: Payer: Self-pay | Admitting: Nurse Practitioner

## 2024-11-15 VITALS — BP 148/60 | HR 82 | Temp 98.2°F | Ht 63.0 in | Wt 149.2 lb

## 2024-11-15 DIAGNOSIS — J441 Chronic obstructive pulmonary disease with (acute) exacerbation: Secondary | ICD-10-CM

## 2024-11-15 DIAGNOSIS — I5042 Chronic combined systolic (congestive) and diastolic (congestive) heart failure: Secondary | ICD-10-CM

## 2024-11-15 DIAGNOSIS — J069 Acute upper respiratory infection, unspecified: Secondary | ICD-10-CM | POA: Diagnosis not present

## 2024-11-15 DIAGNOSIS — J9611 Chronic respiratory failure with hypoxia: Secondary | ICD-10-CM

## 2024-11-15 LAB — POC COVID19 BINAXNOW: SARS Coronavirus 2 Ag: NEGATIVE

## 2024-11-15 MED ORDER — IPRATROPIUM BROMIDE 0.06 % NA SOLN: 2.0000 | Freq: Three times a day (TID) | NASAL | 12 refills | Status: AC

## 2024-11-15 MED ORDER — PREDNISONE 20 MG PO TABS: 40.0000 mg | ORAL_TABLET | Freq: Every day | ORAL | 0 refills | Status: AC

## 2024-11-15 NOTE — Assessment & Plan Note (Signed)
 Mild AECOPD with increased dyspnea over the last month. Recent cough seems to be related to postnasal drainage and URI symptoms. COVID test today negative. Likely viral in nature. Will treat her with prednisone  burst and reassess response. Continue maintenance therapies. If no improvement or worsening occurs, recommend further workup with imaging and consider empiric abx. Action plan in place. Mucociliary clearance and supportive care recommended.  Patient Instructions  -Continue Stiolto 2 puffs daily. This is your new maintenance inhaler that you will use daily regardless of symptoms -Continue albuterol  inhaler 2 puffs or 3 mL neb every 6 hours as needed for shortness of breath or wheezing. This is a rescue inhaler and you only need to use these if you are having trouble with your breathing -Continue flonase  nasal spray 2 sprays each nostril daily  -Continue supplemental oxygen  3 lpm with activity and at night for goal oxygen  levels greater than 88-90%   Prednisone  40 mg daily for 5 days. Take in AM with food Over the counter Mucinex  or Mucinex  DM if cough is more bothersome until congestion improves Atrovent  (ipratropium) nasal spray 2 sprays each nostril Three times a day as needed for nasal drainage/congestion   COVID test negative    Follow up in 6-8 weeks with any new MD to establish care. If symptoms do not improve or worsen, please contact office for sooner follow up or seek emergency care.

## 2024-11-15 NOTE — Progress Notes (Signed)
 @Patient  ID: [name], female    DOB: [birth year], 89 y.o.   MRN: [id number]  Chief Complaint  Patient presents with   Follow-up    Increased sob x 1 mth., cough-dk.  ?cold Yellow x 2-3 days, denies wheezing and fever    Referring provider: Geofm Glade [name], MD  HPI: 89 year old female, former smoker followed for emphysema and chronic respiratory failure on supplemental O2. She is a former patient of [name] and last seen in office 01/03/2024 by Malachy NP. Past medical history significant for PH, CHF, CAD, A fib on eliquis , HTN, HLD, DM on insulin , allergies.   TEST/EVENTS:  05/21/2014 PFT: FVC 110, FEV1 94, ratio 66, TLC 95, DLCOcor 39. No BD 06/22/2023 ONO on room air: 41 min 7 sec </88%, SpO2 low 81% 10/08/2023 CXR: bibasilar opacities concerning for potential pna or aspiration pneumonitis L>R; atherosclerosis, cardiomegaly 01/03/2024 CXR: improved aeration; no airspace disease. Coarsened interstitial markings. Emphysema. CAD.   06/02/2023: OV with [name]. OSA intolerant of CPAP. Chronic respiratory failure with SpO2 low 86% on room air during walk test. Recovered with 2 lpm O2. ONO ordered to determine supplemental O2 requirements at night. Depending on these results, could consider changing her to Inogen. COPD/emphysema with minimal symptoms. Doesn't feel she needs inhalers at this time.   01/03/2024: [name] with [name] Usery NP Patient presents today for follow up. She was hospitalized in October 2024 for AECOPD secondary to pna. She was treated with azithromycin , steroids and discharged home. She has felt better since then. She feels like she's back to her normal. She does get winded with longer distances. Feels like she may want to try an inhaler now. Doesn't have much of a cough. No wheezing that she notices. Denies any fevers, chills, hemoptysis, weight loss, leg swelling, chest pain, palpitations. She uses her oxygen  2-3 POC with activity. She sleeps with 3 lpm at night.   11/15/2024: Today  - follow up Discussed the use of AI scribe software for clinical note transcription with the patient, who gave verbal consent to proceed.  History of Present Illness [name] is a 89 year old female who presents with worsening breathing difficulties. Her daughter is with her.   She has experienced worsening breathing difficulties over the past month. No known sick exposure at the time of onset. No increased oxygen  requirements. Over the last 2-3 days, she's developed cold like symptoms with sinus congestion and a productive cough with some yellow phlegm. No fever, body aches, chills, hemoptysis, wheezing, or leg swelling. No CP, palpitations. Oxygen  levels at home have been stable. She denies frequent use of her rescue inhaler and confirms continued use of her Stiolto inhaler.  She mentions having allergies and nasal drainage, which she believes may be contributing to her symptoms. She does have flonase  nasal spray.     Allergies  Allergen Reactions   Levaquin  [Levofloxacin ] Shortness Of Breath and Swelling    angioedema   Lobster [Shellfish Allergy] Other (See Comments)    angioedema   Penicillins Rash    Has patient had a PCN reaction causing immediate rash, facial/tongue/throat swelling, SOB or lightheadedness with hypotension: Yes Has patient had a PCN reaction causing severe rash involving mucus membranes or skin necrosis: No Has patient had a PCN reaction that required hospitalization: No Has patient had a PCN reaction occurring within the last 10 years: No If all of the above answers are NO, then may proceed with Cephalosporin use.   Valsartan Other (  See Comments)    REACTION: angioedema   Codeine Other (See Comments)    Mental status changes   Lipitor [Atorvastatin ] Other (See Comments)    weakness   Oxycodone  Other (See Comments)    hallucinations   Statins     Made her too weak   Tramadol    Amlodipine  Besylate Other (See Comments)    REACTION: tingling in  lips & gum edema 7/12: talked to patient, states she is tolerating well   Crestor  [Rosuvastatin  Calcium ] Rash   Tramadol Hcl Nausea And Vomiting    Immunization History  Administered Date(s) Administered   Fluad Quad(high Dose 65+) 08/22/2019, 10/07/2021, 10/01/2022, 09/21/2024   Fluad Trivalent(High Dose 65+) 10/11/2023   INFLUENZA, HIGH DOSE SEASONAL PF 10/14/2016, 10/10/2017, 09/28/2018   Influenza Split 11/16/2011, 09/26/2012   Influenza Whole 12/27/2004, 10/21/2008, 10/20/2009, 11/11/2010   Influenza,inj,Quad PF,6+ Mos 09/24/2013, 12/16/2014, 09/19/2015   Moderna Covid-19 Vaccine Bivalent Booster 76yrs & up 10/01/2022   Moderna SARS-COV2 Booster Vaccination 11/03/2020   Moderna Sars-Covid-2 Vaccination 02/08/2020, 03/10/2020   PPD Test 05/31/2015, 07/07/2016   Pfizer(Comirnaty)Fall Seasonal Vaccine 12 years and older 09/21/2024   Pneumococcal Conjugate-13 07/31/2018   Pneumococcal Polysaccharide-23 12/16/2014   Pneumococcal-Unspecified 05/31/2015   Td 06/15/2010    Past Medical History:  Diagnosis Date   Anemia    iron defficiency   Anginal pain    Arthritis    knees, feet, hands; joints (05/27/2015)   Asthma    Atrial fibrillation or flutter    s/p RFCA 7/08;   s/p DCCV in past;   previously on amiodarone ;  amio stopped due to lung toxicity   CAD (coronary artery disease)    s/p NSTEMI tx with BMS to OM1 3/08;  cath 3/08: pOM 99% tx with PCI, pLAD 20%, ? mod stenosis at the AM   CAP (community acquired pneumonia) 05/24/2015   CHF (congestive heart failure) (HCC)    Chronic diastolic heart failure (HCC)    echo 11/11:  EF 55-60%, severe LVH, mod LAE, mild MR, mildly increased PASP   COPD (chronic obstructive pulmonary disease) (HCC)    Degenerative joint disease    Dystrophy, corneal stromal    Heart murmur    HLD (hyperlipidemia)    HTN (hypertension)    essential nos   Hypopotassemia    PMH of   Muscle pain    Myocardial infarction (HCC) 2008   OSA on CPAP     Osteoporosis    Pneumonia 05/27/2015   Protein calorie malnutrition    Rash and nonspecific skin eruption    both arms,awaiting bio   dr robinson   Seasonal allergies    Shortness of breath dyspnea    Type II diabetes mellitus (HCC)    Dr Kassie    Tobacco History: Social History   Tobacco Use  Smoking Status Former   Current packs/day: 0.00   Average packs/day: 0.3 packs/day for 18.0 years (4.5 ttl pk-yrs)   Types: Cigarettes   Start date: 12/27/1950   Quit date: 12/27/1968   Years since quitting: 55.9  Smokeless Tobacco Never  Tobacco Comments   smoked 1952- 1970, up to 1 pp week   Counseling given: Not Answered Tobacco comments: smoked 1952- 1970, up to 1 pp week   Outpatient Medications Prior to Visit  Medication Sig Dispense Refill   albuterol  (VENTOLIN  HFA) 108 (90 Base) MCG/ACT inhaler Inhale 2 puffs into the lungs every 6 (six) hours as needed for wheezing or shortness of breath. 6.7 g 2  amLODipine  (NORVASC ) 5 MG tablet TAKE 1 TABLET BY MOUTH DAILY 90 tablet 3   apixaban  (ELIQUIS ) 5 MG TABS tablet Take 1 tablet (5 mg total) by mouth 2 (two) times daily. 180 tablet 3   Blood Glucose Monitoring Suppl (ONETOUCH VERIO) w/Device KIT Use as instructed to check blood sugar 1 kit 0   carvedilol  (COREG ) 6.25 MG tablet Take 1 tablet (6.25 mg total) by mouth 2 (two) times daily with a meal. 180 tablet 3   Continuous Glucose Receiver (FREESTYLE LIBRE 3 READER) DEVI Use to check glucose continuously. 1 each 0   Continuous Glucose Sensor (FREESTYLE LIBRE 3 PLUS SENSOR) MISC Inject 1 Device into the skin continuous. Change every 15 days 6 each 3   fluticasone  (FLONASE ) 50 MCG/ACT nasal spray Place 1 spray into both nostrils daily as needed for allergies or rhinitis. 16 g 3   glucose blood (ONETOUCH VERIO) test strip CHECK BLOOD SUGAR TWICE DAILY 200 strip 3   insulin  aspart (NOVOLOG  FLEXPEN) 100 UNIT/ML FlexPen Inject 4-6 Units into the skin 3 (three) times daily before meals. 15 mL 3    ipratropium-albuterol  (DUONEB) 0.5-2.5 (3) MG/3ML SOLN Take 3 mLs by nebulization every 6 (six) hours as needed. 360 mL 1   Lancet Devices (LANCING DEVICE) MISC Use as advised - for Delica Lancets 1 each 0   Multiple Vitamins-Minerals (CENTRUM SILVER ULTRA WOMENS PO) Take 1 tablet by mouth daily.     nitroGLYCERIN  (NITROSTAT ) 0.4 MG SL tablet Place 1 tablet (0.4 mg total) under the tongue every 5 (five) minutes as needed for chest pain. 25 tablet 3   OXYGEN  Inhale 3 Doses into the lungs as needed. 3 liters     potassium chloride  SA (KLOR-CON  M) 20 MEQ tablet Take 2 tablets (40 mEq total) by mouth daily. 180 tablet 3   pregabalin  (LYRICA ) 50 MG capsule Take 50 mg by mouth daily.     Semaglutide ,0.25 or 0.5MG /DOS, (OZEMPIC , 0.25 OR 0.5 MG/DOSE,) 2 MG/3ML SOPN Inject 0.5 mg into the skin once a week. 9 mL 3   Tiotropium Bromide -Olodaterol (STIOLTO RESPIMAT ) 2.5-2.5 MCG/ACT AERS Inhale 2 puffs into the lungs daily. 4 g 11   torsemide  (DEMADEX ) 20 MG tablet Take 2 tablets (40 mg total) by mouth 2 (two) times daily. 360 tablet 3   traZODone  (DESYREL ) 50 MG tablet Take 0.5-1 tablets (25-50 mg total) by mouth at bedtime as needed for sleep. 30 tablet 5   Vitamin D , Cholecalciferol , 25 MCG (1000 UT) CAPS Take 1 capsule by mouth daily.     Insulin  Pen Needle (BD PEN NEEDLE NANO 2ND GEN) 32G X 4 MM MISC Use as directed 3 (three) times daily. 300 each 3   Lancets (ONETOUCH DELICA PLUS LANCET33G) MISC USE TO MONITOR GLUCOSE  LEVELS TWICE DAILY 200 each 3   pregabalin  (LYRICA ) 50 MG capsule Take 1 capsule (50 mg total) by mouth 2 (two) times daily. (Patient not taking: Reported on 11/07/2024) 60 capsule 2   No facility-administered medications prior to visit.     Review of Systems: as above    Physical Exam:  BP (!) 148/60 (BP Location: Left Arm, Cuff Size: Normal)   Pulse 82   Temp 98.2 F (36.8 C) (Oral)   Ht 5' 3 (1.6 m)   Wt 149 lb 3.2 oz (67.7 kg)   SpO2 94%   BMI 26.43 kg/m   GEN:  Pleasant, interactive, well-kempt; chronically-ill appearing; in no acute distress HEENT:  Normocephalic and atraumatic. PERRLA. Sclera white. Nasal  turbinates erythematous, moist and patent bilaterally. No rhinorrhea present. Oropharynx pink and moist, without exudate or edema. No lesions, ulcerations, or postnasal drip.  NECK:  Supple w/ fair ROM. No JVD present. Normal carotid impulses w/o bruits. Thyroid  symmetrical with no goiter or nodules palpated. Cervical lymphadenopathy.   CV: Irregular rhythm, rate controlled, no m/r/g, no peripheral edema. Pulses intact, +2 bilaterally. No cyanosis, pallor or clubbing. PULMONARY:  Unlabored, regular breathing. Diminished bilaterally A&P w/o wheezes/rales/rhonchi. No accessory muscle use.  GI: BS present and normoactive. Soft, non-tender to palpation.  MSK: No erythema, warmth or tenderness.  Neuro: A/Ox3. No focal deficits noted.   Skin: Warm, no lesions or rashe Psych: Normal affect and behavior. Judgement and thought content appropriate.     Lab Results:  CBC    Component Value Date/Time   WBC 16.0 (H) 10/11/2023 0436   RBC 4.34 10/11/2023 0436   HGB 12.9 10/11/2023 0436   HCT 41.2 10/11/2023 0436   PLT 164 10/11/2023 0436   MCV 94.9 10/11/2023 0436   MCH 29.7 10/11/2023 0436   MCHC 31.3 10/11/2023 0436   RDW 14.2 10/11/2023 0436   LYMPHSABS 0.8 10/08/2023 0927   MONOABS 0.4 10/08/2023 0927   EOSABS 0.3 10/08/2023 0927   BASOSABS 0.0 10/08/2023 0927    BMET    Component Value Date/Time   NA 139 10/08/2023 0927   NA 138 07/02/2016 0000   NA 138 07/02/2016 0000   K 4.4 10/08/2023 0927   CL 97 (L) 10/08/2023 0927   CO2 35 (H) 10/08/2023 0927   GLUCOSE 187 (H) 10/08/2023 0927   BUN 25 (H) 10/08/2023 0927   BUN 25 (A) 07/02/2016 0000   BUN 25 (A) 07/02/2016 0000   CREATININE 0.86 10/08/2023 0927   CALCIUM  10.4 (H) 10/08/2023 0927   GFRNONAA >60 10/08/2023 0927   GFRAA 46 (L) 06/09/2020 1133    BNP    Component Value  Date/Time   BNP 222.7 (H) 10/08/2023 0927     Imaging:  VAS US  ABI WITH/WO TBI Result Date: 10/19/2024  LOWER EXTREMITY DOPPLER STUDY Patient Name:  [name]  Date of Exam:   10/19/2024 Medical Rec #: [id number]          Accession #:    [id number] Date of Birth: 09-Aug-1931         Patient Gender: F Patient Age:   71 years Exam Location:  [address] Procedure:      VAS US  ABI WITH/WO TBI Referring Phys: NORMAN SERVE --------------------------------------------------------------------------------  Indications: Claudication, and peripheral artery disease. High Risk Factors: Hypertension, Diabetes, past history of smoking.  Vascular Interventions: 04/11/2020 Angioplasty and stent of the right EIA. Performing Technologist: Devere Dark RVT  Examination Guidelines: A complete evaluation includes at minimum, Doppler waveform signals and systolic blood pressure reading at the level of bilateral brachial, anterior tibial, and posterior tibial arteries, when vessel segments are accessible. Bilateral testing is considered an integral part of a complete examination. Photoelectric Plethysmograph (PPG) waveforms and toe systolic pressure readings are included as required and additional duplex testing as needed. Limited examinations for reoccurring indications may be performed as noted.  ABI Findings: +---------+------------------+-----+----------+--------+ Right    Rt Pressure (mmHg)IndexWaveform  Comment  +---------+------------------+-----+----------+--------+ Brachial 187                                       +---------+------------------+-----+----------+--------+ PTA      107  0.57 monophasic         +---------+------------------+-----+----------+--------+ DP       76                0.40 monophasic         +---------+------------------+-----+----------+--------+ Great Toe27                0.14 Abnormal            +---------+------------------+-----+----------+--------+ +---------+------------------+-----+----------+-------+ Left     Lt Pressure (mmHg)IndexWaveform  Comment +---------+------------------+-----+----------+-------+ Brachial 188                                      +---------+------------------+-----+----------+-------+ PTA      58                0.31 monophasic        +---------+------------------+-----+----------+-------+ DP       54                0.29 monophasic        +---------+------------------+-----+----------+-------+ Great Toe80                0.43 Abnormal          +---------+------------------+-----+----------+-------+ +-------+-----------+-----------+------------+------------+ ABI/TBIToday's ABIToday's TBIPrevious ABIPrevious TBI +-------+-----------+-----------+------------+------------+ Right  0.57       0.14       0.40        absent       +-------+-----------+-----------+------------+------------+ Left   0.31       0.43       0.49        0.23         +-------+-----------+-----------+------------+------------+  Right ABIs and TBIs appear increased. Left TBIs appear decreased.  Summary: Right: Resting right ankle-brachial index indicates moderate right lower extremity arterial disease. The right toe-brachial index is abnormal.  Left: Resting left ankle-brachial index indicates severe left lower extremity arterial disease. The left toe-brachial index is abnormal.  *See table(s) above for measurements and observations.  Electronically signed by Norman Serve on 10/19/2024 at 4:36:58 PM.    Final     Administration History     None          Latest Ref Rng & Units 05/21/2014   10:58 AM  PFT Results  FVC-Pre L 2.10   FVC-Predicted Pre % 110   FVC-Post L 2.09   FVC-Predicted Post % 110   Pre FEV1/FVC % % 67   Post FEV1/FCV % % 66   FEV1-Pre L 1.40   FEV1-Predicted Pre % 95   FEV1-Post L 1.37   DLCO uncorrected ml/min/mmHg 8.97   DLCO  [facility]% % 39   DLCO corrected ml/min/mmHg 8.97   DLCO COR %Predicted % 39   DLVA Predicted % 54   TLC L 4.69   TLC % Predicted % 95   RV % Predicted % 117     No results found for: NITRICOXIDE      Assessment & Plan:   COPD with acute exacerbation (HCC) Mild AECOPD with increased dyspnea over the last month. Recent cough seems to be related to postnasal drainage and URI symptoms. COVID test today negative. Likely viral in nature. Will treat her with prednisone  burst and reassess response. Continue maintenance therapies. If no improvement or worsening occurs, recommend further workup with imaging and consider empiric abx. Action plan in place. Mucociliary clearance and supportive care recommended.  Patient Instructions  -Continue Stiolto  2 puffs daily. This is your new maintenance inhaler that you will use daily regardless of symptoms -Continue albuterol  inhaler 2 puffs or 3 mL neb every 6 hours as needed for shortness of breath or wheezing. This is a rescue inhaler and you only need to use these if you are having trouble with your breathing -Continue flonase  nasal spray 2 sprays each nostril daily  -Continue supplemental oxygen  3 lpm with activity and at night for goal oxygen  levels greater than 88-90%   Prednisone  40 mg daily for 5 days. Take in AM with food Over the counter Mucinex  or Mucinex  DM if cough is more bothersome until congestion improves Atrovent  (ipratropium) nasal spray 2 sprays each nostril Three times a day as needed for nasal drainage/congestion   COVID test negative    Follow up in 6-8 weeks with any new MD to establish care. If symptoms do not improve or worsen, please contact office for sooner follow up or seek emergency care.    URI (upper respiratory infection) See above. Add on OTC expectorant and cough suppression PRN. Rx for atrovent  nasal spray tid. Side effect profile reviewed.  Chronic respiratory failure with hypoxia (HCC) Stable without increased  requirement. Goal >88-90%.   Chronic combined systolic and diastolic heart failure (HCC) Euvolemic on exam. Follow up with cardiology as scheduled    Advised if symptoms do not improve or worsen, to please contact office for sooner follow up or seek emergency care.   I spent 35 minutes of dedicated to the care of this patient on the date of this encounter to include pre-visit review of records, face-to-face time with the patient discussing conditions above, post visit ordering of testing, clinical documentation with the electronic health record, making appropriate referrals as documented, and communicating necessary findings to members of the patients care team.  Comer LULLA Rouleau, NP 11/15/2024  Pt aware and understands NP's role.

## 2024-11-15 NOTE — Assessment & Plan Note (Signed)
 Euvolemic on exam. Follow up with cardiology as scheduled

## 2024-11-15 NOTE — Assessment & Plan Note (Signed)
Stable without increased requirement. Goal >88-90%

## 2024-11-15 NOTE — Assessment & Plan Note (Signed)
 See above. Add on OTC expectorant and cough suppression PRN. Rx for atrovent  nasal spray tid. Side effect profile reviewed.

## 2024-11-15 NOTE — Patient Instructions (Addendum)
-  Continue Stiolto 2 puffs daily. This is your new maintenance inhaler that you will use daily regardless of symptoms -Continue albuterol  inhaler 2 puffs or 3 mL neb every 6 hours as needed for shortness of breath or wheezing. This is a rescue inhaler and you only need to use these if you are having trouble with your breathing -Continue flonase  nasal spray 2 sprays each nostril daily  -Continue supplemental oxygen  3 lpm with activity and at night for goal oxygen  levels greater than 88-90%   Prednisone  40 mg daily for 5 days. Take in AM with food Over the counter Mucinex  or Mucinex  DM if cough is more bothersome until congestion improves Atrovent  (ipratropium) nasal spray 2 sprays each nostril Three times a day as needed for nasal drainage/congestion   COVID test negative    Follow up in 6-8 weeks with any new MD to establish care. If symptoms do not improve or worsen, please contact office for sooner follow up or seek emergency care.

## 2024-11-16 DIAGNOSIS — M25541 Pain in joints of right hand: Secondary | ICD-10-CM | POA: Diagnosis not present

## 2024-11-16 DIAGNOSIS — R2689 Other abnormalities of gait and mobility: Secondary | ICD-10-CM | POA: Diagnosis not present

## 2024-11-16 DIAGNOSIS — M25542 Pain in joints of left hand: Secondary | ICD-10-CM | POA: Diagnosis not present

## 2024-11-16 DIAGNOSIS — R278 Other lack of coordination: Secondary | ICD-10-CM | POA: Diagnosis not present

## 2024-11-16 DIAGNOSIS — M6281 Muscle weakness (generalized): Secondary | ICD-10-CM | POA: Diagnosis not present

## 2024-11-19 ENCOUNTER — Other Ambulatory Visit (HOSPITAL_COMMUNITY): Payer: Self-pay

## 2024-11-19 ENCOUNTER — Other Ambulatory Visit: Payer: Self-pay

## 2024-11-19 ENCOUNTER — Other Ambulatory Visit: Payer: Self-pay | Admitting: Internal Medicine

## 2024-11-20 ENCOUNTER — Other Ambulatory Visit (HOSPITAL_COMMUNITY): Payer: Self-pay

## 2024-11-20 DIAGNOSIS — E1122 Type 2 diabetes mellitus with diabetic chronic kidney disease: Secondary | ICD-10-CM | POA: Diagnosis not present

## 2024-11-20 MED ORDER — FREESTYLE LIBRE 3 PLUS SENSOR MISC
1.0000 | 3 refills | Status: AC
  Filled 2024-11-20: qty 6, 84d supply, fill #0
  Filled 2024-12-04: qty 6, 90d supply, fill #0
  Filled 2024-12-19: qty 6, 84d supply, fill #0
  Filled 2024-12-21: qty 4, 60d supply, fill #0
  Filled 2025-01-09: qty 2, 30d supply, fill #1
  Filled ????-??-??: fill #0

## 2024-11-23 ENCOUNTER — Encounter (HOSPITAL_BASED_OUTPATIENT_CLINIC_OR_DEPARTMENT_OTHER): Payer: Self-pay | Admitting: Emergency Medicine

## 2024-11-23 ENCOUNTER — Other Ambulatory Visit: Payer: Self-pay

## 2024-11-23 ENCOUNTER — Inpatient Hospital Stay (HOSPITAL_BASED_OUTPATIENT_CLINIC_OR_DEPARTMENT_OTHER)
Admission: EM | Admit: 2024-11-23 | Discharge: 2024-11-26 | DRG: 638 | Disposition: A | Attending: Internal Medicine | Admitting: Internal Medicine

## 2024-11-23 ENCOUNTER — Emergency Department (HOSPITAL_BASED_OUTPATIENT_CLINIC_OR_DEPARTMENT_OTHER)

## 2024-11-23 ENCOUNTER — Emergency Department (HOSPITAL_BASED_OUTPATIENT_CLINIC_OR_DEPARTMENT_OTHER): Admitting: Radiology

## 2024-11-23 DIAGNOSIS — E785 Hyperlipidemia, unspecified: Secondary | ICD-10-CM | POA: Diagnosis present

## 2024-11-23 DIAGNOSIS — Z9049 Acquired absence of other specified parts of digestive tract: Secondary | ICD-10-CM

## 2024-11-23 DIAGNOSIS — Z885 Allergy status to narcotic agent status: Secondary | ICD-10-CM

## 2024-11-23 DIAGNOSIS — N3001 Acute cystitis with hematuria: Secondary | ICD-10-CM | POA: Diagnosis present

## 2024-11-23 DIAGNOSIS — J432 Centrilobular emphysema: Secondary | ICD-10-CM | POA: Diagnosis not present

## 2024-11-23 DIAGNOSIS — E875 Hyperkalemia: Secondary | ICD-10-CM | POA: Diagnosis present

## 2024-11-23 DIAGNOSIS — I10 Essential (primary) hypertension: Secondary | ICD-10-CM | POA: Diagnosis present

## 2024-11-23 DIAGNOSIS — I130 Hypertensive heart and chronic kidney disease with heart failure and stage 1 through stage 4 chronic kidney disease, or unspecified chronic kidney disease: Secondary | ICD-10-CM | POA: Diagnosis present

## 2024-11-23 DIAGNOSIS — Z87891 Personal history of nicotine dependence: Secondary | ICD-10-CM

## 2024-11-23 DIAGNOSIS — E1100 Type 2 diabetes mellitus with hyperosmolarity without nonketotic hyperglycemic-hyperosmolar coma (NKHHC): Secondary | ICD-10-CM | POA: Diagnosis not present

## 2024-11-23 DIAGNOSIS — E871 Hypo-osmolality and hyponatremia: Secondary | ICD-10-CM | POA: Diagnosis present

## 2024-11-23 DIAGNOSIS — I480 Paroxysmal atrial fibrillation: Secondary | ICD-10-CM | POA: Diagnosis not present

## 2024-11-23 DIAGNOSIS — I482 Chronic atrial fibrillation, unspecified: Secondary | ICD-10-CM | POA: Diagnosis present

## 2024-11-23 DIAGNOSIS — I6782 Cerebral ischemia: Secondary | ICD-10-CM | POA: Diagnosis not present

## 2024-11-23 DIAGNOSIS — Z8249 Family history of ischemic heart disease and other diseases of the circulatory system: Secondary | ICD-10-CM

## 2024-11-23 DIAGNOSIS — Z955 Presence of coronary angioplasty implant and graft: Secondary | ICD-10-CM

## 2024-11-23 DIAGNOSIS — I5032 Chronic diastolic (congestive) heart failure: Secondary | ICD-10-CM | POA: Diagnosis present

## 2024-11-23 DIAGNOSIS — L89151 Pressure ulcer of sacral region, stage 1: Secondary | ICD-10-CM | POA: Diagnosis present

## 2024-11-23 DIAGNOSIS — I252 Old myocardial infarction: Secondary | ICD-10-CM

## 2024-11-23 DIAGNOSIS — R4182 Altered mental status, unspecified: Secondary | ICD-10-CM | POA: Diagnosis not present

## 2024-11-23 DIAGNOSIS — E119 Type 2 diabetes mellitus without complications: Secondary | ICD-10-CM

## 2024-11-23 DIAGNOSIS — J449 Chronic obstructive pulmonary disease, unspecified: Secondary | ICD-10-CM | POA: Diagnosis present

## 2024-11-23 DIAGNOSIS — Z9841 Cataract extraction status, right eye: Secondary | ICD-10-CM

## 2024-11-23 DIAGNOSIS — E1159 Type 2 diabetes mellitus with other circulatory complications: Secondary | ICD-10-CM | POA: Diagnosis present

## 2024-11-23 DIAGNOSIS — I25119 Atherosclerotic heart disease of native coronary artery with unspecified angina pectoris: Secondary | ICD-10-CM | POA: Diagnosis not present

## 2024-11-23 DIAGNOSIS — Z91013 Allergy to seafood: Secondary | ICD-10-CM

## 2024-11-23 DIAGNOSIS — R410 Disorientation, unspecified: Secondary | ICD-10-CM | POA: Diagnosis not present

## 2024-11-23 DIAGNOSIS — M47812 Spondylosis without myelopathy or radiculopathy, cervical region: Secondary | ICD-10-CM | POA: Diagnosis not present

## 2024-11-23 DIAGNOSIS — Z833 Family history of diabetes mellitus: Secondary | ICD-10-CM

## 2024-11-23 DIAGNOSIS — N1831 Chronic kidney disease, stage 3a: Secondary | ICD-10-CM | POA: Diagnosis present

## 2024-11-23 DIAGNOSIS — Z7985 Long-term (current) use of injectable non-insulin antidiabetic drugs: Secondary | ICD-10-CM

## 2024-11-23 DIAGNOSIS — S0990XA Unspecified injury of head, initial encounter: Secondary | ICD-10-CM | POA: Diagnosis not present

## 2024-11-23 DIAGNOSIS — E1122 Type 2 diabetes mellitus with diabetic chronic kidney disease: Secondary | ICD-10-CM

## 2024-11-23 DIAGNOSIS — E782 Mixed hyperlipidemia: Secondary | ICD-10-CM | POA: Diagnosis present

## 2024-11-23 DIAGNOSIS — N179 Acute kidney failure, unspecified: Secondary | ICD-10-CM | POA: Diagnosis not present

## 2024-11-23 DIAGNOSIS — E1110 Type 2 diabetes mellitus with ketoacidosis without coma: Secondary | ICD-10-CM | POA: Diagnosis not present

## 2024-11-23 DIAGNOSIS — N390 Urinary tract infection, site not specified: Secondary | ICD-10-CM | POA: Diagnosis present

## 2024-11-23 DIAGNOSIS — R9389 Abnormal findings on diagnostic imaging of other specified body structures: Secondary | ICD-10-CM | POA: Diagnosis not present

## 2024-11-23 DIAGNOSIS — Z888 Allergy status to other drugs, medicaments and biological substances status: Secondary | ICD-10-CM

## 2024-11-23 DIAGNOSIS — R059 Cough, unspecified: Secondary | ICD-10-CM | POA: Diagnosis not present

## 2024-11-23 DIAGNOSIS — Z881 Allergy status to other antibiotic agents status: Secondary | ICD-10-CM

## 2024-11-23 DIAGNOSIS — M4802 Spinal stenosis, cervical region: Secondary | ICD-10-CM | POA: Diagnosis not present

## 2024-11-23 DIAGNOSIS — I272 Pulmonary hypertension, unspecified: Secondary | ICD-10-CM | POA: Diagnosis present

## 2024-11-23 DIAGNOSIS — I4811 Longstanding persistent atrial fibrillation: Secondary | ICD-10-CM | POA: Diagnosis not present

## 2024-11-23 DIAGNOSIS — Z794 Long term (current) use of insulin: Secondary | ICD-10-CM | POA: Diagnosis not present

## 2024-11-23 DIAGNOSIS — G4733 Obstructive sleep apnea (adult) (pediatric): Secondary | ICD-10-CM | POA: Diagnosis present

## 2024-11-23 DIAGNOSIS — Z961 Presence of intraocular lens: Secondary | ICD-10-CM | POA: Diagnosis present

## 2024-11-23 DIAGNOSIS — Z947 Corneal transplant status: Secondary | ICD-10-CM

## 2024-11-23 DIAGNOSIS — I951 Orthostatic hypotension: Secondary | ICD-10-CM | POA: Diagnosis not present

## 2024-11-23 DIAGNOSIS — R000 Tachycardia, unspecified: Secondary | ICD-10-CM | POA: Diagnosis not present

## 2024-11-23 DIAGNOSIS — T380X5A Adverse effect of glucocorticoids and synthetic analogues, initial encounter: Secondary | ICD-10-CM | POA: Diagnosis present

## 2024-11-23 DIAGNOSIS — I4891 Unspecified atrial fibrillation: Secondary | ICD-10-CM | POA: Diagnosis present

## 2024-11-23 DIAGNOSIS — J9611 Chronic respiratory failure with hypoxia: Secondary | ICD-10-CM | POA: Diagnosis present

## 2024-11-23 DIAGNOSIS — Z9842 Cataract extraction status, left eye: Secondary | ICD-10-CM

## 2024-11-23 DIAGNOSIS — Z043 Encounter for examination and observation following other accident: Secondary | ICD-10-CM | POA: Diagnosis not present

## 2024-11-23 DIAGNOSIS — E1169 Type 2 diabetes mellitus with other specified complication: Secondary | ICD-10-CM | POA: Diagnosis present

## 2024-11-23 DIAGNOSIS — Z7901 Long term (current) use of anticoagulants: Secondary | ICD-10-CM

## 2024-11-23 DIAGNOSIS — I2510 Atherosclerotic heart disease of native coronary artery without angina pectoris: Secondary | ICD-10-CM | POA: Diagnosis present

## 2024-11-23 DIAGNOSIS — Z79899 Other long term (current) drug therapy: Secondary | ICD-10-CM

## 2024-11-23 DIAGNOSIS — L8990 Pressure ulcer of unspecified site, unspecified stage: Secondary | ICD-10-CM | POA: Insufficient documentation

## 2024-11-23 DIAGNOSIS — Z880 Allergy status to penicillin: Secondary | ICD-10-CM

## 2024-11-23 DIAGNOSIS — N3000 Acute cystitis without hematuria: Secondary | ICD-10-CM | POA: Diagnosis not present

## 2024-11-23 DIAGNOSIS — I739 Peripheral vascular disease, unspecified: Secondary | ICD-10-CM | POA: Diagnosis present

## 2024-11-23 DIAGNOSIS — Z96651 Presence of right artificial knee joint: Secondary | ICD-10-CM | POA: Diagnosis present

## 2024-11-23 DIAGNOSIS — M810 Age-related osteoporosis without current pathological fracture: Secondary | ICD-10-CM | POA: Diagnosis present

## 2024-11-23 LAB — BASIC METABOLIC PANEL WITH GFR
Anion gap: 19 — ABNORMAL HIGH (ref 5–15)
Anion gap: 15 (ref 5–15)
BUN: 46 mg/dL — ABNORMAL HIGH (ref 8–23)
BUN: 43 mg/dL — ABNORMAL HIGH (ref 8–23)
CO2: 27 mmol/L (ref 22–32)
CO2: 30 mmol/L (ref 22–32)
Calcium: 10.4 mg/dL — ABNORMAL HIGH (ref 8.9–10.3)
Calcium: 9.9 mg/dL (ref 8.9–10.3)
Chloride: 85 mmol/L — ABNORMAL LOW (ref 98–111)
Chloride: 89 mmol/L — ABNORMAL LOW (ref 98–111)
Creatinine, Ser: 1.41 mg/dL — ABNORMAL HIGH (ref 0.44–1.00)
Creatinine, Ser: 1.48 mg/dL — ABNORMAL HIGH (ref 0.44–1.00)
GFR, Estimated: 35 mL/min — ABNORMAL LOW (ref >60)
GFR, Estimated: 33 mL/min — ABNORMAL LOW (ref >60)
Glucose, Bld: 430 mg/dL — ABNORMAL HIGH (ref 70–99)
Glucose, Bld: 243 mg/dL — ABNORMAL HIGH (ref 70–99)
Potassium: 4.6 mmol/L (ref 3.5–5.1)
Potassium: 3.9 mmol/L (ref 3.5–5.1)
Sodium: 132 mmol/L — ABNORMAL LOW (ref 135–145)
Sodium: 134 mmol/L — ABNORMAL LOW (ref 135–145)

## 2024-11-23 LAB — CBC WITH DIFFERENTIAL/PLATELET
Abs Immature Granulocytes: 0.06 K/uL (ref 0.00–0.07)
Basophils Absolute: 0 K/uL (ref 0.0–0.1)
Basophils Relative: 0 %
Eosinophils Absolute: 0 K/uL (ref 0.0–0.5)
Eosinophils Relative: 0 %
HCT: 49.3 % — ABNORMAL HIGH (ref 36.0–46.0)
Hemoglobin: 15.9 g/dL — ABNORMAL HIGH (ref 12.0–15.0)
Immature Granulocytes: 1 %
Lymphocytes Relative: 8 %
Lymphs Abs: 0.8 K/uL (ref 0.7–4.0)
MCH: 30.4 pg (ref 26.0–34.0)
MCHC: 32.3 g/dL (ref 30.0–36.0)
MCV: 94.3 fL (ref 80.0–100.0)
Monocytes Absolute: 0.6 K/uL (ref 0.1–1.0)
Monocytes Relative: 6 %
Neutro Abs: 8.2 K/uL — ABNORMAL HIGH (ref 1.7–7.7)
Neutrophils Relative %: 85 %
Platelets: 196 K/uL (ref 150–400)
RBC: 5.23 MIL/uL — ABNORMAL HIGH (ref 3.87–5.11)
RDW: 12.2 % (ref 11.5–15.5)
WBC: 9.8 K/uL (ref 4.0–10.5)
nRBC: 0 % (ref 0.0–0.2)

## 2024-11-23 LAB — I-STAT VENOUS BLOOD GAS, ED
Acid-Base Excess: 7 mmol/L — ABNORMAL HIGH (ref 0.0–2.0)
Bicarbonate: 33.6 mmol/L — ABNORMAL HIGH (ref 20.0–28.0)
Calcium, Ion: 1.16 mmol/L (ref 1.15–1.40)
HCT: 51 % — ABNORMAL HIGH (ref 36.0–46.0)
Hemoglobin: 17.3 g/dL — ABNORMAL HIGH (ref 12.0–15.0)
O2 Saturation: 44 %
Patient temperature: 98.7
Potassium: 5.5 mmol/L — ABNORMAL HIGH (ref 3.5–5.1)
Sodium: 125 mmol/L — ABNORMAL LOW (ref 135–145)
TCO2: 35 mmol/L — ABNORMAL HIGH (ref 22–32)
pCO2, Ven: 51.1 mmHg (ref 44–60)
pH, Ven: 7.426 (ref 7.25–7.43)
pO2, Ven: 25 mmHg — CL (ref 32–45)

## 2024-11-23 LAB — GLUCOSE, CAPILLARY
Glucose-Capillary: 116 mg/dL — ABNORMAL HIGH (ref 70–99)
Glucose-Capillary: 143 mg/dL — ABNORMAL HIGH (ref 70–99)
Glucose-Capillary: 147 mg/dL — ABNORMAL HIGH (ref 70–99)
Glucose-Capillary: 310 mg/dL — ABNORMAL HIGH (ref 70–99)

## 2024-11-23 LAB — URINALYSIS, ROUTINE W REFLEX MICROSCOPIC
Bilirubin Urine: NEGATIVE
Glucose, UA: >1000 mg/dL — AB
Hgb urine dipstick: NEGATIVE
Nitrite: NEGATIVE
Protein, ur: NEGATIVE mg/dL
Specific Gravity, Urine: 1.008 (ref 1.005–1.030)
pH: 5.5 (ref 5.0–8.0)

## 2024-11-23 LAB — COMPREHENSIVE METABOLIC PANEL WITH GFR
ALT: 23 U/L (ref 0–44)
AST: 40 U/L (ref 15–41)
Albumin: 4 g/dL (ref 3.5–5.0)
Alkaline Phosphatase: 113 U/L (ref 38–126)
Anion gap: 20 — ABNORMAL HIGH (ref 5–15)
BUN: 50 mg/dL — ABNORMAL HIGH (ref 8–23)
CO2: 25 mmol/L (ref 22–32)
Calcium: 10.5 mg/dL — ABNORMAL HIGH (ref 8.9–10.3)
Chloride: 82 mmol/L — ABNORMAL LOW (ref 98–111)
Creatinine, Ser: 1.52 mg/dL — ABNORMAL HIGH (ref 0.44–1.00)
GFR, Estimated: 32 mL/min — ABNORMAL LOW (ref >60)
Glucose, Bld: 604 mg/dL (ref 70–99)
Potassium: 5.9 mmol/L — ABNORMAL HIGH (ref 3.5–5.1)
Sodium: 127 mmol/L — ABNORMAL LOW (ref 135–145)
Total Bilirubin: 0.9 mg/dL (ref 0.0–1.2)
Total Protein: 8.1 g/dL (ref 6.5–8.1)

## 2024-11-23 LAB — CBG MONITORING, ED
Glucose-Capillary: >600 mg/dL (ref 70–99)
Glucose-Capillary: 569 mg/dL (ref 70–99)
Glucose-Capillary: 438 mg/dL — ABNORMAL HIGH (ref 70–99)
Glucose-Capillary: 378 mg/dL — ABNORMAL HIGH (ref 70–99)
Glucose-Capillary: 392 mg/dL — ABNORMAL HIGH (ref 70–99)

## 2024-11-23 LAB — TROPONIN T, HIGH SENSITIVITY
Troponin T High Sensitivity: 34 ng/L — ABNORMAL HIGH (ref 0–19)
Troponin T High Sensitivity: 29 ng/L — ABNORMAL HIGH (ref 0–19)

## 2024-11-23 LAB — MRSA NEXT GEN BY PCR, NASAL: MRSA by PCR Next Gen: NOT DETECTED

## 2024-11-23 LAB — CK: Total CK: 145 U/L (ref 38–234)

## 2024-11-23 LAB — BETA-HYDROXYBUTYRIC ACID: Beta-Hydroxybutyric Acid: 3.61 mmol/L — ABNORMAL HIGH (ref 0.05–0.27)

## 2024-11-23 MED ORDER — NITROGLYCERIN 0.4 MG SL SUBL: 0.4000 mg | SUBLINGUAL_TABLET | SUBLINGUAL | Status: DC | PRN

## 2024-11-23 MED ORDER — DEXTROSE IN LACTATED RINGERS 5 % IV SOLN: INTRAVENOUS | Status: DC

## 2024-11-23 MED ORDER — ARFORMOTEROL TARTRATE 15 MCG/2ML IN NEBU
15.0000 ug | INHALATION_SOLUTION | Freq: Two times a day (BID) | RESPIRATORY_TRACT | Status: DC
  Administered 2024-11-24 – 2024-11-26 (×5): 15 ug via RESPIRATORY_TRACT
  Filled 2024-11-23 (×6): qty 2

## 2024-11-23 MED ORDER — ACETAMINOPHEN 325 MG PO TABS
650.0000 mg | ORAL_TABLET | Freq: Four times a day (QID) | ORAL | Status: DC | PRN
  Administered 2024-11-26: 650 mg via ORAL
  Filled 2024-11-23: qty 2

## 2024-11-23 MED ORDER — DEXTROSE 50 % IV SOLN: 0.0000 mL | INTRAVENOUS | Status: DC | PRN

## 2024-11-23 MED ORDER — NITROFURANTOIN MONOHYD MACRO 100 MG PO CAPS
100.0000 mg | ORAL_CAPSULE | Freq: Once | ORAL | Status: AC
  Administered 2024-11-23: 100 mg via ORAL
  Filled 2024-11-23: qty 1

## 2024-11-23 MED ORDER — IPRATROPIUM-ALBUTEROL 0.5-2.5 (3) MG/3ML IN SOLN
3.0000 mL | RESPIRATORY_TRACT | Status: DC | PRN
  Administered 2024-11-23: 3 mL via RESPIRATORY_TRACT
  Filled 2024-11-23: qty 3

## 2024-11-23 MED ORDER — IPRATROPIUM-ALBUTEROL 0.5-2.5 (3) MG/3ML IN SOLN
3.0000 mL | Freq: Four times a day (QID) | RESPIRATORY_TRACT | Status: DC | PRN
  Administered 2024-11-24: 3 mL via RESPIRATORY_TRACT
  Filled 2024-11-23: qty 3

## 2024-11-23 MED ORDER — CARVEDILOL 6.25 MG PO TABS
6.2500 mg | ORAL_TABLET | Freq: Two times a day (BID) | ORAL | Status: DC
  Administered 2024-11-24 – 2024-11-26 (×5): 6.25 mg via ORAL
  Filled 2024-11-23 (×5): qty 1

## 2024-11-23 MED ORDER — INSULIN ASPART 100 UNIT/ML IJ SOLN: 15.0000 [IU] | Freq: Once | INTRAMUSCULAR | Status: DC

## 2024-11-23 MED ORDER — LACTATED RINGERS IV SOLN: INTRAVENOUS | Status: DC

## 2024-11-23 MED ORDER — INSULIN REGULAR(HUMAN) IN NACL 100-0.9 UT/100ML-% IV SOLN
INTRAVENOUS | Status: DC
  Administered 2024-11-23: 7.5 [IU]/h via INTRAVENOUS
  Filled 2024-11-23: qty 100

## 2024-11-23 MED ORDER — AMLODIPINE BESYLATE 5 MG PO TABS
5.0000 mg | ORAL_TABLET | Freq: Every day | ORAL | Status: DC
  Administered 2024-11-24 – 2024-11-26 (×3): 5 mg via ORAL
  Filled 2024-11-23 (×3): qty 1

## 2024-11-23 MED ORDER — ONDANSETRON HCL 4 MG PO TABS: 4.0000 mg | ORAL_TABLET | Freq: Four times a day (QID) | ORAL | Status: DC | PRN

## 2024-11-23 MED ORDER — VITAMIN D 25 MCG (1000 UNIT) PO TABS
1000.0000 [IU] | ORAL_TABLET | Freq: Every day | ORAL | Status: DC
  Administered 2024-11-24 – 2024-11-26 (×3): 1000 [IU] via ORAL
  Filled 2024-11-23 (×3): qty 1

## 2024-11-23 MED ORDER — INSULIN REGULAR(HUMAN) IN NACL 100-0.9 UT/100ML-% IV SOLN: INTRAVENOUS | Status: DC

## 2024-11-23 MED ORDER — ONDANSETRON HCL 4 MG/2ML IJ SOLN: 4.0000 mg | Freq: Four times a day (QID) | INTRAMUSCULAR | Status: DC | PRN

## 2024-11-23 MED ORDER — APIXABAN 5 MG PO TABS
5.0000 mg | ORAL_TABLET | Freq: Two times a day (BID) | ORAL | Status: DC
  Administered 2024-11-23 – 2024-11-26 (×6): 5 mg via ORAL
  Filled 2024-11-23 (×6): qty 1

## 2024-11-23 MED ORDER — LACTATED RINGERS IV BOLUS
1000.0000 mL | Freq: Once | INTRAVENOUS | Status: AC
  Administered 2024-11-23: 1000 mL via INTRAVENOUS

## 2024-11-23 MED ORDER — METOPROLOL TARTRATE 5 MG/5ML IV SOLN
5.0000 mg | INTRAVENOUS | Status: DC | PRN
  Administered 2024-11-24: 5 mg via INTRAVENOUS
  Filled 2024-11-23: qty 5

## 2024-11-23 MED ORDER — ACETAMINOPHEN 650 MG RE SUPP: 650.0000 mg | Freq: Four times a day (QID) | RECTAL | Status: DC | PRN

## 2024-11-23 MED ORDER — POLYETHYLENE GLYCOL 3350 17 G PO PACK: 17.0000 g | PACK | Freq: Every day | ORAL | Status: DC | PRN

## 2024-11-23 MED ORDER — CHLORHEXIDINE GLUCONATE CLOTH 2 % EX PADS
6.0000 | MEDICATED_PAD | Freq: Every day | CUTANEOUS | Status: DC
  Administered 2024-11-23 – 2024-11-26 (×4): 6 via TOPICAL
  Filled 2024-11-23: qty 6

## 2024-11-23 MED ORDER — POTASSIUM CHLORIDE 10 MEQ/100ML IV SOLN
10.0000 meq | INTRAVENOUS | Status: AC
  Administered 2024-11-23 – 2024-11-24 (×2): 10 meq via INTRAVENOUS
  Filled 2024-11-23 (×2): qty 100

## 2024-11-23 MED ORDER — SODIUM CHLORIDE 0.9 % IV SOLN
1.0000 g | INTRAVENOUS | Status: DC
  Administered 2024-11-23 – 2024-11-25 (×3): 1 g via INTRAVENOUS
  Filled 2024-11-23 (×3): qty 10

## 2024-11-23 MED ORDER — UMECLIDINIUM BROMIDE 62.5 MCG/ACT IN AEPB
1.0000 | INHALATION_SPRAY | Freq: Every day | RESPIRATORY_TRACT | Status: DC
  Administered 2024-11-24 – 2024-11-26 (×3): 1 via RESPIRATORY_TRACT
  Filled 2024-11-23: qty 7

## 2024-11-23 NOTE — Progress Notes (Signed)
 eLink Physician-Brief Progress Note Patient Name: [name] DOB: Jan 11, 1931 MRN: [id number]   Date of Service  11/23/2024  HPI/Events of Note  56F with atrial fib, DM2, HTN admitted for hyperglycemia, UTI and AKI. TRH primary Insulin  gtt started but weaned off with last glucose 116   eICU Interventions  Elink available as needed     Intervention Category Evaluation Type: New Patient Evaluation  Ameri Cahoon Slater Staff 11/23/2024, 11:38 PM

## 2024-11-23 NOTE — ED Notes (Signed)
 Critical P02 24 on VBG, notified MD to repeat test per ISTAT recommendations. RT suggest an ABG for accurate results. Pt uses 3 L [place] @ home, SP02 96% upon arrival with neb tx given.

## 2024-11-23 NOTE — ED Notes (Signed)
 Patient with multiple attempts and several PIV failing while in ER.  Suggest consult to IV team for possible PICC line or midline if suitable

## 2024-11-23 NOTE — ED Notes (Signed)
 No repeat VBG indicated at this time per MD.

## 2024-11-23 NOTE — Assessment & Plan Note (Signed)
 Has nitroglycerin  as needed

## 2024-11-23 NOTE — ED Notes (Addendum)
 Cleaned and changed pt's soiled sheets. Purewick in place.

## 2024-11-23 NOTE — Assessment & Plan Note (Signed)
 Continue low-cholesterol diet

## 2024-11-23 NOTE — Assessment & Plan Note (Signed)
 Appears euvolemic

## 2024-11-23 NOTE — Assessment & Plan Note (Addendum)
 Baseline creatinine is 0.83 Suspect this is related to her HHS, gentle IV hydration Avoid nephrotoxic agents Trend

## 2024-11-23 NOTE — Assessment & Plan Note (Signed)
-   Currently rate controlled - Continue Eliquis

## 2024-11-23 NOTE — ED Provider Notes (Signed)
 [facility] EMERG[facility]Y DEPARTMENT AT [facility] Provider Note   CSN: [id number] Arrival date & time: 11/23/24  1221     Patient presents with: Altered Mental Status   [name] is a 89 y.o. female with history of A-fib on Eliquis , heart failure, hypertension, type 2 diabetes on insulin , chronic use of 3 L oxygen , presents with concern for increased forgetfulness over the past couple of days.  Patient reports that she has had difficulty with taking her medications and that is abnormal for her.  Caregiver at bedside also reports that patient has seemed more forgetful over the last couple of days.  Patient reports she is eating and drinking normally.  She does report some dysuria over the past couple days.  Denies any flank pain, hematuria, or increased frequency.  She does report slipping off of her chair a couple days ago and being on the floor for an unknown amount of time.  She was uncertain if she hit her head or not.  Denies any loss of consciousness.  Patient also reports that she has had a productive cough for about 1 week.  Denies any associated fever or chills.  She was seen by an outside provider prescribed prednisone  which she has been taking.  She does report that her blood sugar read very high this morning.    Altered Mental Status Associated symptoms: no fever        Prior to Admission medications   Medication Sig Start Date End Date Taking? Authorizing Provider  albuterol  (VENTOLIN  HFA) 108 (90 Base) MCG/ACT inhaler Inhale 2 puffs into the lungs every 6 (six) hours as needed for wheezing or shortness of breath. 01/03/24   Cobb, Comer GAILS, NP  amLODipine  (NORVASC ) 5 MG tablet TAKE 1 TABLET BY MOUTH DAILY 02/28/24   Bensimhon, Toribio SAUNDERS, MD  apixaban  (ELIQUIS ) 5 MG TABS tablet Take 1 tablet (5 mg total) by mouth 2 (two) times daily. 12/30/23   Bensimhon, Toribio SAUNDERS, MD  Blood Glucose Monitoring Suppl (ONETOUCH VERIO) w/Device KIT Use as instructed to check blood sugar  12/30/23   Trixie File, MD  carvedilol  (COREG ) 6.25 MG tablet Take 1 tablet (6.25 mg total) by mouth 2 (two) times daily with a meal. 02/28/24   Bensimhon, Toribio SAUNDERS, MD  Continuous Glucose Receiver (FREESTYLE LIBRE 3 READER) DEVI Use to check glucose continuously. 12/30/23   Trixie File, MD  Continuous Glucose Sensor (FREESTYLE LIBRE 3 PLUS SENSOR) MISC Inject 1 Device into the skin for continuous glucose monitoring. Change every 15 days 11/20/24   Trixie File, MD  fluticasone  (FLONASE ) 50 MCG/ACT nasal spray Place 1 spray into both nostrils daily as needed for allergies or rhinitis. 09/19/24   Cobb, Comer GAILS, NP  glucose blood (ONETOUCH VERIO) test strip CHECK BLOOD SUGAR TWICE DAILY 12/30/23   Trixie File, MD  insulin  aspart (NOVOLOG  FLEXPEN) 100 UNIT/ML FlexPen Inject 4-6 Units into the skin 3 (three) times daily before meals. 12/30/23   Trixie File, MD  Insulin  Pen Needle (BD PEN NEEDLE NANO 2ND GEN) 32G X 4 MM MISC Use as directed 3 (three) times daily. 08/18/23   Gherghe, Cristina, MD  ipratropium (ATROVENT ) 0.06 % nasal spray Place 2 sprays into both nostrils 3 (three) times daily. 11/15/24   Cobb, Comer GAILS, NP  ipratropium-albuterol  (DUONEB) 0.5-2.5 (3) MG/3ML SOLN Take 3 mLs by nebulization every 6 (six) hours as needed. 10/10/23 11/07/25  Patsy Lenis, MD  Lancet Devices (LANCING DEVICE) MISC Use as advised - for [facility] Lancets  09/14/21   Trixie File, MD  Lancets [facility] DELICA PLUS LANCET33G) MISC USE TO MONITOR GLUCOSE  LEVELS TWICE DAILY 05/03/20   Kassie Mallick, MD  Multiple Vitamins-Minerals (CENTRUM SILVER ULTRA WOMENS PO) Take 1 tablet by mouth daily.    [provider]  nitroGLYCERIN  (NITROSTAT ) 0.4 MG SL tablet Place 1 tablet (0.4 mg total) under the tongue every 5 (five) minutes as needed for chest pain. 08/21/24   Geofm Glade PARAS, MD  OXYGEN  Inhale 3 Doses into the lungs as needed. 3 liters    [provider]  potassium chloride  SA  (KLOR-CON  M) 20 MEQ tablet Take 2 tablets (40 mEq total) by mouth daily. 12/30/23   Bensimhon, Toribio SAUNDERS, MD  pregabalin  (LYRICA ) 50 MG capsule Take 1 capsule (50 mg total) by mouth 2 (two) times daily. Patient not taking: Reported on 11/07/2024 09/20/24   Gershon Donnice SAUNDERS, DPM  pregabalin  (LYRICA ) 50 MG capsule Take 50 mg by mouth daily.    [provider]  Semaglutide ,0.25 or 0.5MG /DOS, (OZEMPIC , 0.25 OR 0.5 MG/DOSE,) 2 MG/3ML SOPN Inject 0.5 mg into the skin once a week. 01/16/24   Trixie File, MD  Tiotropium Bromide -Olodaterol (STIOLTO RESPIMAT ) 2.5-2.5 MCG/ACT AERS Inhale 2 puffs into the lungs daily. 01/03/24   Cobb, Comer GAILS, NP  torsemide  (DEMADEX ) 20 MG tablet Take 2 tablets (40 mg total) by mouth 2 (two) times daily. 12/30/23   Bensimhon, Toribio SAUNDERS, MD  traZODone  (DESYREL ) 50 MG tablet Take 0.5-1 tablets (25-50 mg total) by mouth at bedtime as needed for sleep. 10/10/24   Geofm Glade PARAS, MD  Vitamin D , Cholecalciferol , 25 MCG (1000 UT) CAPS Take 1 capsule by mouth daily.    [provider]    Allergies: Levaquin  [levofloxacin ], Char cerise allergy], Penicillins, Valsartan, Codeine, Lipitor [atorvastatin ], Oxycodone , Statins, Tramadol, Amlodipine  besylate, Crestor  [rosuvastatin  calcium ], and Tramadol hcl    Review of Systems  Constitutional:  Negative for fever.    Updated Vital Signs BP (!) 169/71   Pulse 90   Temp 98.4 F (36.9 C) (Oral)   Resp 17   SpO2 96%   Physical Exam Vitals and nursing note reviewed.  Constitutional:      General: She is not in acute distress.    Appearance: She is well-developed.  HENT:     Head: Normocephalic and atraumatic.  Eyes:     Conjunctiva/sclera: Conjunctivae normal.  Cardiovascular:     Rate and Rhythm: Normal rate and regular rhythm.     Heart sounds: No murmur heard. Pulmonary:     Effort: Pulmonary effort is normal. No respiratory distress.     Breath sounds: Normal breath sounds.     Comments: Patient  coughing up green appearing sputum Abdominal:     Palpations: Abdomen is soft.     Tenderness: There is no abdominal tenderness.  Musculoskeletal:        General: No swelling.     Cervical back: Neck supple.     Comments: No cervical, thoracic, or lumbar spinal tenderness to palpation.  No tenderness to the chest wall diffusely.  No tenderness of the upper or lower extremities diffusely.  Moves all extremities without difficulty  Skin:    General: Skin is warm and dry.     Capillary Refill: Capillary refill takes less than 2 seconds.  Neurological:     Mental Status: She is alert.     Comments: Patient alert and oriented to self, place, year, and situation  Psychiatric:  Mood and Affect: Mood normal.     (all labs ordered are listed, but only abnormal results are displayed) Labs Reviewed  CBC WITH DIFFERENTIAL/PLATELET - Abnormal; Notable for the following components:      Result Value   RBC 5.23 (*)    Hemoglobin 15.9 (*)    HCT 49.3 (*)    Neutro Abs 8.2 (*)    All other components within normal limits  COMPREHENSIVE METABOLIC PANEL WITH GFR - Abnormal; Notable for the following components:   Sodium 127 (*)    Potassium 5.9 (*)    Chloride 82 (*)    Glucose, Bld 604 (*)    BUN 50 (*)    Creatinine, Ser 1.52 (*)    Calcium  10.5 (*)    GFR, Estimated 32 (*)    Anion gap 20 (*)    All other components within normal limits  BETA-HYDROXYBUTYRIC ACID - Abnormal; Notable for the following components:   Beta-Hydroxybutyric Acid 3.61 (*)    All other components within normal limits  URINALYSIS, ROUTINE W REFLEX MICROSCOPIC - Abnormal; Notable for the following components:   Glucose, UA >1,000 (*)    Ketones, ur TRACE (*)    Leukocytes,Ua MODERATE (*)    Bacteria, UA MANY (*)    All other components within normal limits  BASIC METABOLIC PANEL WITH GFR - Abnormal; Notable for the following components:   Sodium 132 (*)    Chloride 85 (*)    Glucose, Bld 430 (*)    BUN  46 (*)    Creatinine, Ser 1.41 (*)    Calcium  10.4 (*)    GFR, Estimated 35 (*)    Anion gap 19 (*)    All other components within normal limits  CBG MONITORING, ED - Abnormal; Notable for the following components:   Glucose-Capillary >600 (*)    All other components within normal limits  I-STAT VENOUS BLOOD GAS, ED - Abnormal; Notable for the following components:   pO2, Ven 25 (*)    Bicarbonate 33.6 (*)    TCO2 35 (*)    Acid-Base Excess 7.0 (*)    Sodium 125 (*)    Potassium 5.5 (*)    HCT 51.0 (*)    Hemoglobin 17.3 (*)    All other components within normal limits  CBG MONITORING, ED - Abnormal; Notable for the following components:   Glucose-Capillary 569 (*)    All other components within normal limits  CBG MONITORING, ED - Abnormal; Notable for the following components:   Glucose-Capillary 438 (*)    All other components within normal limits  CBG MONITORING, ED - Abnormal; Notable for the following components:   Glucose-Capillary 378 (*)    All other components within normal limits  TROPONIN T, HIGH SENSITIVITY - Abnormal; Notable for the following components:   Troponin T High Sensitivity 34 (*)    All other components within normal limits  TROPONIN T, HIGH SENSITIVITY - Abnormal; Notable for the following components:   Troponin T High Sensitivity 29 (*)    All other components within normal limits  URINE CULTURE  CK  BASIC METABOLIC PANEL WITH GFR  BASIC METABOLIC PANEL WITH GFR  BASIC METABOLIC PANEL WITH GFR  BASIC METABOLIC PANEL WITH GFR    EKG: EKG Interpretation Date/Time:  Friday November 23 2024 12:31:43 EST Ventricular Rate:  78 PR Interval:    QRS Duration:  131 QT Interval:  394 QTC Calculation: 449 R Axis:   -85  Text Interpretation: Atrial  fibrillation RBBB and LAFB LVH with secondary repolarization abnormality Nonspecific ST abnormality No significant change since last tracing Confirmed by Charlyn [name] [phone]) on 11/23/2024 12:46:28  PM  Radiology: CT Cervical Spine Wo Contrast Result Date: 11/23/2024 EXAM: CT CERVICAL SPINE WITHOUT CONTRAST 11/23/2024 01:51:04 PM TECHNIQUE: CT of the cervical spine was performed without the administration of intravenous contrast. Multiplanar reformatted images are provided for review. Automated exposure control, iterative reconstruction, and/or weight based adjustment of the mA/kV was utilized to reduce the radiation dose to as low as reasonably achievable. COMPARISON: None available. CLINICAL HISTORY: Fall. FINDINGS: CERVICAL SPINE: BONES AND ALIGNMENT: Straightening of the normal cervical lordosis. Trace degenerative anterolisthesis of C4 on C5. Partial fusion of the C5 and C6 vertebral bodies. Diffuse osteopenia. Asymmetric spacing of the dens and lateral masses of C1 likely related to asymmetric degenerative changes at the atlantoaxial articulations. No acute fracture or traumatic malalignment. DEGENERATIVE CHANGES: Disc osteophyte complexes at multiple levels. Facet arthrosis. No vertebral hypertrophy throughout the cervical spine. There is no high grade osseous spinal canal stenosis. There is significant foraminal stenosis at multiple levels particularly on the right at C3-C4 and on the left at C4-C5. SOFT TISSUES: No prevertebral soft tissue swelling. LUNGS: Centrilobular emphysema. IMPRESSION: 1. No acute abnormality of the cervical spine. 2. Degenerative changes as above. 3. Asymmetric spacing of the dens and lateral masses of C1, likely related to asymmetric degenerative changes at the atlantoaxial articulations. 4. Emphysema. Electronically signed by: Donnice Mania MD 11/23/2024 02:40 PM EST RP Workstation: [id number]   CT Head Wo Contrast Result Date: 11/23/2024 EXAM: CT HEAD WITHOUT 11/23/2024 01:49:31 PM TECHNIQUE: CT of the head was performed without the administration of intravenous contrast. Automated exposure control, iterative reconstruction, and/or weight based adjustment of the  mA/kV was utilized to reduce the radiation dose to as low as reasonably achievable. COMPARISON: 05/24/2017 CLINICAL HISTORY: Head trauma, minor (Age >= 65y); Fall on thinners, AMS FINDINGS: BRAIN AND VENTRICLES: No acute intracranial hemorrhage. No mass effect or midline shift. No extra-axial fluid collection. No evidence of acute infarct. No hydrocephalus. Moderate chronic microvascular ischemic change with generalized volume loss. Bilateral basal ganglia mineralization. 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 ORBITS: Bilateral lens replacement. SINUSES AND MASTOIDS: No acute abnormality. SOFT TISSUES AND SKULL: No acute skull fracture. No acute soft tissue abnormality. IMPRESSION: 1. No acute intracranial abnormality. 2. 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 3. Moderate chronic microvascular ischemic change with generalized volume loss. Electronically signed by: Donnice Mania MD 11/23/2024 02:15 PM EST RP Workstation: [id number]   DG Chest 2 View Result Date: 11/23/2024 CLINICAL DATA:  Confusion and cough EXAM: CHEST - 2 VIEW COMPARISON:  Chest radiograph dated 01/03/2024 FINDINGS: Unchanged elevation of the right hemidiaphragm. Normal lung volumes. No focal consolidations. No pleural effusion or pneumothorax. Enlarged cardiomediastinal silhouette is likely projectional. No acute osseous abnormality. IMPRESSION: No active cardiopulmonary disease. Electronically Signed   By: Limin  Xu M.D.   On: 11/23/2024 13:56     .Critical Care  Performed by: Veta Palma, PA-C Authorized by: Veta Palma, PA-C   Critical care provider statement:    Critical care time (minutes):  33   Critical care was necessary to treat or prevent imminent or life-threatening deterioration of the following conditions:  Endocrine crisis   Critical care was time spent personally by me on the following activities:   Development of treatment plan with patient or surrogate, discussions  with consultants, evaluation of patient's response to treatment, examination of patient, ordering and review of laboratory studies, ordering and review of radiographic studies, ordering and performing treatments and interventions, pulse oximetry, re-evaluation of patient's condition, review of old charts and obtaining history from patient or surrogate   Care discussed with: admitting provider   Comments:     DKA, IV insulin     Medications Ordered in the ED  ipratropium-albuterol  (DUONEB) 0.5-2.5 (3) MG/3ML nebulizer solution 3 mL (3 mLs Nebulization Given 11/23/24 1235)  insulin  regular, human (MYXREDLIN) 100 units/ 100 mL infusion (7.5 Units/hr Intravenous New Bag/Given 11/23/24 1602)  lactated ringers  infusion (has no administration in time range)  dextrose  5 % in lactated ringers  infusion (has no administration in time range)  dextrose  50 % solution 0-50 mL (has no administration in time range)  lactated ringers  bolus 1,000 mL (1,000 mLs Intravenous New Bag/Given 11/23/24 1403)  nitrofurantoin (macrocrystal-monohydrate) (MACROBID) capsule 100 mg (100 mg Oral Given 11/23/24 1605)    Clinical Course as of 11/23/24 1855  Fri Nov 23, 2024  1431 No fever, tachycardia, increased respirations, or leukocytosis, no clinical concern for sepsis at this time [AF]  1809 Consulted with Dr. Fredirick who recommended admission [AF]    Clinical Course User Index [AF] Veta Palma, PA-C                                 Medical Decision Making Amount and/or Complexity of Data Reviewed Labs: ordered. Radiology: ordered.  Risk Prescription drug management. Decision regarding hospitalization.     Differential diagnosis includes but is not limited to hyperglycemia, DKA, HHS, electrolyte abnormality, dehydration, UTI, sepsis, rhabdomyolysis, intracranial hemorrhage, ACS, CVA  ED Course:  Upon initial evaluation, patient is very  well-appearing, no acute distress.  Normal vital signs aside from her elevated blood pressure upon arrival at 178/64.  She is reporting increased forgetfulness over the past couple days.  She is alert and oriented x 4 on exam.  She does not have any neurologic deficits on exam.  Low concern for CVA at this time.  Patient also reporting some dysuria, but no other complaints currently.  Given her reported fall the other day, will obtain CT head and cervical spine as she is on Eliquis .  Will also obtain CK to evaluate for possible rhabdomyolysis  Labs Ordered: I Ordered, and personally interpreted labs.  The pertinent results include:   CMP with elevated glucose at 604 upon arrival.  Hyponatremia to 127, but when corrected for hyperglycemia, corrected sodium is 135.  Creatinine 1.52, up from 0.86 taken 1 year ago.  Hyperkalemia to 5.9.  Anion gap of 20 CBG without leukocytosis.  Hemoglobin elevated at 15.9 VBG with normal pH.  Low PaO2 at 25, elevated bicarb at 33.6 Beta hydroxybutyric acid elevated at 3.61 CK within normal limits Initial troponin elevated at 34, repeat 29  Imaging Studies ordered: I ordered imaging studies including CT head, CT cervical spine, chest x-ray I independently visualized the imaging with scope of interpretation limited to determining acute life threatening conditions related to emergency care. Imaging showed  CT head: IMPRESSION:  1. No acute intracranial abnormality.  2. Small remote cortical infarcts in the right occipital lobe and inferior left  parietal lobe.  3. Moderate chronic microvascular ischemic change with generalized volume loss.   CT cervical spine: IMPRESSION:  1. No acute abnormality of the cervical spine.  2. Degenerative changes as above.  3.  Asymmetric spacing of the dens and lateral masses of C1, likely related to  asymmetric degenerative changes at the atlantoaxial articulations.  4. Emphysema.   Chest x-ray without acute abnormality  I  agree with the radiologist interpretation   Cardiac Monitoring: / EKG: The patient was maintained on a cardiac monitor.  I personally viewed and interpreted the cardiac monitored which showed an underlying rhythm of: Atrial fibrillation   Consultations Obtained: I requested consultation with the hospitalist Dr. Fredirick,  and discussed lab and imaging findings as well as pertinent plan - they recommend: admission  Medications Given: 1 L LR LR infusion Insulin  Nitrofurantoin  Upon re-evaluation, patient remains well-appearing with stable vitals.  Her blood sugars have been slowly coming down, last CBG with glucose at 438, down from 604 upon arrival.  She does appear to have DKA as she has an elevated beta hydroxybutyric acid and anion gap present with her hyperglycemia.  This is likely due to recent course of prednisone .  She also has AKI present.  She was started on insulin , fluids.  Urinalysis also showed evidence of UTI with moderate leukocytes and red blood cells present.  It did appear to be a clean-catch.  Given her dysuria and altered mental status, will also treat for UTI.  She is allergic to penicillins, will also avoid cephalosporins.  She was given a dose of Macrobid here today.  Urine has been sent off for culture. Altered mental status does not seem cardiac in nature.  Her EKG has baseline atrial fibrillation.  Initial and repeat troponin are flat.  Patient denies any chest pain or shortness of breath. Chest x-ray without evidence of pneumonia or other acute abnormality that explain her altered mental status  Given need for IV fluids, insulin , feel patient needs admission at this time for continuation of these medications and to ensure resolution of her altered mental status  Impression: DKA AKI UTI  Disposition:  Admission with hospitalist Dr. Fredirick     This chart was dictated using voice recognition software, Dragon. Despite the best efforts of this provider to proofread  and correct errors, errors may still occur which can change documentation meaning.       Final diagnoses:  AKI (acute kidney injury)  Diabetic ketoacidosis without coma associated with type 2 diabetes mellitus (HCC)  Acute cystitis with hematuria    ED Discharge Orders     None          Veta Palma, PA-C 11/23/24 [name] Charlyn Sora, MD 11/28/24 1216

## 2024-11-23 NOTE — Assessment & Plan Note (Signed)
 Will need to resume her home medications when she is out of of HHS.

## 2024-11-23 NOTE — ED Triage Notes (Addendum)
 Family reports confusion, urinary frequency, and elevated glucose. Monitor reading HIGH.  Recently finished prednisone  for congestion.   Arrived wearing 3L [place]. Baseline.

## 2024-11-23 NOTE — ED Notes (Signed)
 [facility] at bedside

## 2024-11-23 NOTE — Assessment & Plan Note (Signed)
 Check urine culture IV Rocephin

## 2024-11-23 NOTE — Progress Notes (Signed)
 Plan of Care Note for accepted transfer   Patient: [name] MRN: [id number]   DOA: 11/23/2024  Facility requesting transfer: [facility] Requesting Provider: Alan Harari, PA Reason for transfer: Admit to ICU Facility course: Patient was seen for altered mental status.  She has had a recent URI and been placed on a steroid.  On evaluation at the [facility] she was noted to have a possible UTI and was given Macrobid, had markedly elevated CBG with positive beta hydroxybutyrate and was started on Endo tool  Plan of care: The patient is accepted for admission to ICU unit, at [facility]..    Author: Glenys GORMAN Birk, MD 11/23/2024  Check [email or web address] for on-call coverage.  Nursing staff, Please call [facility] Admits & Consults System-Wide number on Amion as soon as patient's arrival, so appropriate admitting provider can evaluate the pt.

## 2024-11-23 NOTE — H&P (Signed)
 History and Physical    Patient: [name] [name] DOB: February 26, 1931 DOA: 11/23/2024 DOS: the patient was seen and examined on 11/23/2024 PCP: Geofm Glade [name], MD  Patient coming from: ALF/ILF [facility]  Chief Complaint:  Chief Complaint  Patient presents with   Altered Mental Status   HPI: [name] is a 89 y.o. female with medical history significant of T2DM, COPD, HLD, HTN, CAD, A-fib on Eliquis , pulmonary hypertension, OSA, CKD 3A, PAD, and CHFpEF who presents to the [facility] ED with altered mental status.  The patient had been placed on prednisone  approximately 8 days ago for suspected COPD exacerbation.  In the ED she was noted to be confused, and found to have hyperglycemic hyperosmolar state.  Her sugar was greater than 600, she had positive beta hydroxybutyrate and anion gap that was elevated with a normal bicarb.  Patient was given IV fluid bolus as well as started on Endo tool.  Urinalysis was also completed and showed positive leuks with concern for infection and she was started on p.o. Macrobid .  Urine culture was obtained.  Review of Systems: As mentioned in the history of present illness. All other systems reviewed and are negative. Past Medical History:  Diagnosis Date   Anemia    iron defficiency   Anginal pain    Arthritis    knees, feet, hands; joints (05/27/2015)   Asthma    Atrial fibrillation or flutter    s/p RFCA 7/08;   s/p DCCV in past;   previously on amiodarone ;  amio stopped due to lung toxicity   CAD (coronary artery disease)    s/p NSTEMI tx with BMS to OM1 3/08;  cath 3/08: pOM 99% tx with PCI, pLAD 20%, ? mod stenosis at the AM   CAP (community acquired pneumonia) 05/24/2015   CHF (congestive heart failure) (HCC)    Chronic diastolic heart failure (HCC)    echo 11/11:  EF 55-60%, severe LVH, mod LAE, mild MR, mildly increased PASP   COPD (chronic obstructive pulmonary disease) (HCC)    Degenerative joint disease     Dystrophy, corneal stromal    Heart murmur    HLD (hyperlipidemia)    HTN (hypertension)    essential nos   Hypopotassemia    PMH of   Muscle pain    Myocardial infarction (HCC) 2008   OSA on CPAP    Osteoporosis    Pneumonia 05/27/2015   Protein calorie malnutrition    Rash and nonspecific skin eruption    both arms,awaiting bio   dr robinson   Seasonal allergies    Shortness of breath dyspnea    Type II diabetes mellitus (HCC)    Dr Kassie   Past Surgical History:  Procedure Laterality Date   A FLUTTER ABLATION     Dr Waddell   ABDOMINAL AORTOGRAM W/LOWER EXTREMITY Bilateral 04/11/2020   Procedure: ABDOMINAL AORTOGRAM W/LOWER EXTREMITY;  Surgeon: Eliza Lonni RAMAN, MD;  Location: [facility] INVASIVE CV LAB;  Service: Cardiovascular;  Laterality: Bilateral;   CATARACT EXTRACTION W/ INTRAOCULAR LENS  IMPLANT, BILATERAL Bilateral    CHOLECYSTECTOMY     COLONOSCOPY  6/07   2 polyps Dr. Cinda in [place]   colonoscopy with polypectomy      X 2; [facility]   CORNEAL TRANSPLANT Bilateral    CORONARY ANGIOPLASTY WITH STENT PLACEMENT  02/2007   BMS w/Dr Waddell   CORONARY ATHERECTOMY N/A 05/16/2020   Procedure: CORONARY ATHERECTOMY;  Surgeon: Jordan, Peter M, MD;  Location: [facility]  INVASIVE CV LAB;  Service: Cardiovascular;  Laterality: N/A;   CORONARY BALLOON ANGIOPLASTY N/A 05/16/2020   Procedure: CORONARY BALLOON ANGIOPLASTY;  Surgeon: Jordan, Peter M, MD;  Location: [facility] INVASIVE CV LAB;  Service: Cardiovascular;  Laterality: N/A;   CORONARY STENT INTERVENTION N/A 05/16/2020   Procedure: CORONARY STENT INTERVENTION;  Surgeon: Jordan, Peter M, MD;  Location: [facility] INVASIVE CV LAB;  Service: Cardiovascular;  Laterality: N/A;   CORONARY ULTRASOUND/IVUS N/A 05/16/2020   Procedure: Intravascular Ultrasound/IVUS;  Surgeon: Jordan, Peter M, MD;  Location: [facility] INVASIVE CV LAB;  Service: Cardiovascular;  Laterality: N/A;   EYE SURGERY     gravid 2 para 2     KNEE CARTILAGE SURGERY Right    LEFT AND RIGHT HEART  CATHETERIZATION WITH CORONARY ANGIOGRAM N/A 05/07/2014   Procedure: LEFT AND RIGHT HEART CATHETERIZATION WITH CORONARY ANGIOGRAM;  Surgeon: Peter M Jordan, MD;  Location: [facility] CATH LAB;  Service: Cardiovascular;  Laterality: N/A;   PERIPHERAL VASCULAR INTERVENTION  04/11/2020   Procedure: PERIPHERAL VASCULAR INTERVENTION;  Surgeon: Eliza Lonni RAMAN, MD;  Location: [facility] INVASIVE CV LAB;  Service: Cardiovascular;;   RIGHT HEART CATH N/A 02/04/2021   Procedure: RIGHT HEART CATH;  Surgeon: Cherrie Toribio SAUNDERS, MD;  Location: [facility] INVASIVE CV LAB;  Service: Cardiovascular;  Laterality: N/A;   RIGHT/LEFT HEART CATH AND CORONARY ANGIOGRAPHY N/A 05/16/2020   Procedure: RIGHT/LEFT HEART CATH AND CORONARY ANGIOGRAPHY;  Surgeon: Cherrie Toribio SAUNDERS, MD;  Location: [facility] INVASIVE CV LAB;  Service: Cardiovascular;  Laterality: N/A;   TOTAL KNEE ARTHROPLASTY Right 06/28/2016   Procedure: TOTAL KNEE ARTHROPLASTY;  Surgeon: Dempsey Sensor, MD;  Location: [facility] OR;  Service: Orthopedics;  Laterality: Right;   Social History:  reports that she quit smoking about 55 years ago. Her smoking use included cigarettes. She started smoking about 73 years ago. She has a 4.5 pack-year smoking history. She has never used smokeless tobacco. She reports current alcohol use of about 1.0 standard drink of alcohol per week. She reports that she does not use drugs.  Allergies  Allergen Reactions   Levaquin  [Levofloxacin ] Shortness Of Breath and Swelling    angioedema   Lobster [Shellfish Allergy] Other (See Comments)    angioedema   Penicillins Rash    Has patient had a PCN reaction causing immediate rash, facial/tongue/throat swelling, SOB or lightheadedness with hypotension: Yes Has patient had a PCN reaction causing severe rash involving mucus membranes or skin necrosis: No Has patient had a PCN reaction that required hospitalization: No Has patient had a PCN reaction occurring within the last 10 years: No If all of the above answers are  NO, then may proceed with Cephalosporin use.   Valsartan Other (See Comments)    REACTION: angioedema   Codeine Other (See Comments)    Mental status changes   Lipitor [Atorvastatin ] Other (See Comments)    weakness   Oxycodone  Other (See Comments)    hallucinations   Statins     Made her too weak   Tramadol    Amlodipine  Besylate Other (See Comments)    REACTION: tingling in lips & gum edema 7/12: talked to patient, states she is tolerating well   Crestor  [Rosuvastatin  Calcium ] Rash   Tramadol Hcl Nausea And Vomiting    Family History  Problem Relation Age of Onset   Diabetes Mother    Hypertension Mother    Transient ischemic attack Mother    Arthritis Mother    Heart attack Father 5   Arthritis Father    Hypertension  Father    Breast cancer Maternal Aunt    Arthritis Maternal Aunt     Prior to Admission medications   Medication Sig Start Date End Date Taking? Authorizing Provider  albuterol  (VENTOLIN  HFA) 108 (90 Base) MCG/ACT inhaler Inhale 2 puffs into the lungs every 6 (six) hours as needed for wheezing or shortness of breath. 01/03/24   Cobb, Comer GAILS, NP  amLODipine  (NORVASC ) 5 MG tablet TAKE 1 TABLET BY MOUTH DAILY 02/28/24   Bensimhon, Toribio SAUNDERS, MD  apixaban  (ELIQUIS ) 5 MG TABS tablet Take 1 tablet (5 mg total) by mouth 2 (two) times daily. 12/30/23   Bensimhon, Toribio SAUNDERS, MD  Blood Glucose Monitoring Suppl (ONETOUCH VERIO) w/Device KIT Use as instructed to check blood sugar 12/30/23   Trixie File, MD  carvedilol  (COREG ) 6.25 MG tablet Take 1 tablet (6.25 mg total) by mouth 2 (two) times daily with a meal. 02/28/24   Bensimhon, Toribio SAUNDERS, MD  Continuous Glucose Receiver (FREESTYLE LIBRE 3 READER) DEVI Use to check glucose continuously. 12/30/23   Trixie File, MD  Continuous Glucose Sensor (FREESTYLE LIBRE 3 PLUS SENSOR) MISC Inject 1 Device into the skin for continuous glucose monitoring. Change every 15 days 11/20/24   Trixie File, MD  fluticasone   (FLONASE ) 50 MCG/ACT nasal spray Place 1 spray into both nostrils daily as needed for allergies or rhinitis. 09/19/24   Cobb, Comer GAILS, NP  glucose blood (ONETOUCH VERIO) test strip CHECK BLOOD SUGAR TWICE DAILY 12/30/23   Trixie File, MD  insulin  aspart (NOVOLOG  FLEXPEN) 100 UNIT/ML FlexPen Inject 4-6 Units into the skin 3 (three) times daily before meals. 12/30/23   Trixie File, MD  Insulin  Pen Needle (BD PEN NEEDLE NANO 2ND GEN) 32G X 4 MM MISC Use as directed 3 (three) times daily. 08/18/23   Trixie File, MD  ipratropium (ATROVENT ) 0.06 % nasal spray Place 2 sprays into both nostrils 3 (three) times daily. 11/15/24   Cobb, Comer GAILS, NP  ipratropium-albuterol  (DUONEB) 0.5-2.5 (3) MG/3ML SOLN Take 3 mLs by nebulization every 6 (six) hours as needed. 10/10/23 11/07/25  Patsy Lenis, MD  Lancet Devices (LANCING DEVICE) MISC Use as advised - for [facility] Lancets 09/14/21   Trixie File, MD  Lancets [employer] DELICA PLUS [place]) MISC USE TO MONITOR GLUCOSE  LEVELS TWICE DAILY 05/03/20   Kassie Mallick, MD  Multiple Vitamins-Minerals (CENTRUM SILVER ULTRA WOMENS PO) Take 1 tablet by mouth daily.    [provider]  nitroGLYCERIN  (NITROSTAT ) 0.4 MG SL tablet Place 1 tablet (0.4 mg total) under the tongue every 5 (five) minutes as needed for chest pain. 08/21/24   Geofm Glade PARAS, MD  OXYGEN  Inhale 3 Doses into the lungs as needed. 3 liters    [provider]  potassium chloride  SA (KLOR-CON  M) 20 MEQ tablet Take 2 tablets (40 mEq total) by mouth daily. 12/30/23   Bensimhon, Toribio SAUNDERS, MD  pregabalin  (LYRICA ) 50 MG capsule Take 1 capsule (50 mg total) by mouth 2 (two) times daily. Patient not taking: Reported on 11/07/2024 09/20/24   Gershon Donnice SAUNDERS, DPM  pregabalin  (LYRICA ) 50 MG capsule Take 50 mg by mouth daily.    [provider]  Semaglutide ,0.25 or 0.5MG /DOS, (OZEMPIC , 0.25 OR 0.5 MG/DOSE,) 2 MG/3ML SOPN Inject 0.5 mg into the skin once a week. 01/16/24    Trixie File, MD  Tiotropium Bromide -Olodaterol (STIOLTO RESPIMAT ) 2.5-2.5 MCG/ACT AERS Inhale 2 puffs into the lungs daily. 01/03/24   Cobb, Comer GAILS, NP  torsemide  (DEMADEX ) 20 MG tablet  Take 2 tablets (40 mg total) by mouth 2 (two) times daily. 12/30/23   Bensimhon, Toribio SAUNDERS, MD  traZODone  (DESYREL ) 50 MG tablet Take 0.5-1 tablets (25-50 mg total) by mouth at bedtime as needed for sleep. 10/10/24   Geofm Glade PARAS, MD  Vitamin D , Cholecalciferol , 25 MCG (1000 UT) CAPS Take 1 capsule by mouth daily.    [provider]    Physical Exam: Vitals:   11/23/24 1830 11/23/24 1900 11/23/24 1955 11/23/24 2050  BP: (!) 174/72 (!) 187/71 (!) 162/61 (!) 133/102  Pulse: 91 88 89 90  Resp: 18 (!) 22 (!) 22 19  Temp:    97.8 F (36.6 C)  TempSrc:    Oral  SpO2: 97% 99% 97% 100%  Weight:    63.9 kg  Height:    5' 3 (1.6 m)   Physical Examination: General appearance - alert, well appearing, and in no distress Neck - supple, no significant adenopathy Chest - clear to auscultation, no wheezes, rales or rhonchi, symmetric air entry Heart - irregularly irregular rhythm with rate that is normal Abdomen - soft, nontender, nondistended, no masses or organomegaly Extremities - pedal edema none  Data Reviewed: Results for orders placed or performed during the hospital encounter of 11/23/24 (from the past 24 hours)  POC CBG, ED     Status: Abnormal   Collection Time: 11/23/24 12:56 PM  Result Value Ref Range   Glucose-Capillary >600 (HH) 70 - 99 mg/dL  CBC with Differential     Status: Abnormal   Collection Time: 11/23/24  1:03 PM  Result Value Ref Range   WBC 9.8 4.0 - 10.5 K/uL   RBC 5.23 (H) 3.87 - 5.11 MIL/uL   Hemoglobin 15.9 (H) 12.0 - 15.0 g/dL   HCT 50.6 (H) 63.9 - 53.9 %   MCV 94.3 80.0 - 100.0 fL   MCH 30.4 26.0 - 34.0 pg   MCHC 32.3 30.0 - 36.0 g/dL   RDW 87.7 88.4 - 84.4 %   Platelets 196 150 - 400 K/uL   nRBC 0.0 0.0 - 0.2 %   Neutrophils Relative % 85 %   Neutro Abs  8.2 (H) 1.7 - 7.7 K/uL   Lymphocytes Relative 8 %   Lymphs Abs 0.8 0.7 - 4.0 K/uL   Monocytes Relative 6 %   Monocytes Absolute 0.6 0.1 - 1.0 K/uL   Eosinophils Relative 0 %   Eosinophils Absolute 0.0 0.0 - 0.5 K/uL   Basophils Relative 0 %   Basophils Absolute 0.0 0.0 - 0.1 K/uL   Immature Granulocytes 1 %   Abs Immature Granulocytes 0.06 0.00 - 0.07 K/uL  Comprehensive metabolic panel     Status: Abnormal   Collection Time: 11/23/24  1:03 PM  Result Value Ref Range   Sodium 127 (L) 135 - 145 mmol/L   Potassium 5.9 (H) 3.5 - 5.1 mmol/L   Chloride 82 (L) 98 - 111 mmol/L   CO2 25 22 - 32 mmol/L   Glucose, Bld 604 (HH) 70 - 99 mg/dL   BUN 50 (H) 8 - 23 mg/dL   Creatinine, Ser 8.47 (H) 0.44 - 1.00 mg/dL   Calcium  10.5 (H) 8.9 - 10.3 mg/dL   Total Protein 8.1 6.5 - 8.1 g/dL   Albumin  4.0 3.5 - 5.0 g/dL   AST 40 15 - 41 U/L   ALT 23 0 - 44 U/L   Alkaline Phosphatase 113 38 - 126 U/L   Total Bilirubin 0.9 0.0 - 1.2 mg/dL  GFR, Estimated 32 (L) >60 mL/min   Anion gap 20 (H) 5 - 15  Beta-hydroxybutyric acid     Status: Abnormal   Collection Time: 11/23/24  1:03 PM  Result Value Ref Range   Beta-Hydroxybutyric Acid 3.61 (H) 0.05 - 0.27 mmol/L  Troponin T, High Sensitivity     Status: Abnormal   Collection Time: 11/23/24  1:03 PM  Result Value Ref Range   Troponin T High Sensitivity 34 (H) 0 - 19 ng/L  CK     Status: None   Collection Time: 11/23/24  1:03 PM  Result Value Ref Range   Total CK 145 38 - 234 U/L  I-Stat venous blood gas, ([facility] ED, [facility], [facility])     Status: Abnormal   Collection Time: 11/23/24  1:24 PM  Result Value Ref Range   pH, Ven 7.426 7.25 - 7.43   pCO2, Ven 51.1 44 - 60 mmHg   pO2, Ven 25 (LL) 32 - 45 mmHg   Bicarbonate 33.6 (H) 20.0 - 28.0 mmol/L   TCO2 35 (H) 22 - 32 mmol/L   O2 Saturation 44 %   Acid-Base Excess 7.0 (H) 0.0 - 2.0 mmol/L   Sodium 125 (L) 135 - 145 mmol/L   Potassium 5.5 (H) 3.5 - 5.1 mmol/L   Calcium , Ion 1.16 1.15 - 1.40 mmol/L   HCT  51.0 (H) 36.0 - 46.0 %   Hemoglobin 17.3 (H) 12.0 - 15.0 g/dL   Patient temperature 01.2 F    Collection site Femoral    Drawn by [name]    Sample type VENOUS    Comment MD NOTIFIED, REPEAT TEST   Urinalysis, Routine w reflex microscopic -Urine, Clean Catch     Status: Abnormal   Collection Time: 11/23/24  2:30 PM  Result Value Ref Range   Color, Urine YELLOW YELLOW   APPearance CLEAR CLEAR   Specific Gravity, Urine 1.008 1.005 - 1.030   pH 5.5 5.0 - 8.0   Glucose, UA >1,000 (A) NEGATIVE mg/dL   Hgb urine dipstick NEGATIVE NEGATIVE   Bilirubin Urine NEGATIVE NEGATIVE   Ketones, ur TRACE (A) NEGATIVE mg/dL   Protein, ur NEGATIVE NEGATIVE mg/dL   Nitrite NEGATIVE NEGATIVE   Leukocytes,Ua MODERATE (A) NEGATIVE   RBC / HPF 6-10 0 - 5 RBC/hpf   WBC, UA 21-50 0 - 5 WBC/hpf   Bacteria, UA MANY (A) NONE SEEN   Squamous Epithelial / HPF 0-5 0 - 5 /HPF   Mucus PRESENT    Hyaline Casts, UA PRESENT   Basic metabolic panel     Status: Abnormal   Collection Time: 11/23/24  2:30 PM  Result Value Ref Range   Sodium 132 (L) 135 - 145 mmol/L   Potassium 4.6 3.5 - 5.1 mmol/L   Chloride 85 (L) 98 - 111 mmol/L   CO2 27 22 - 32 mmol/L   Glucose, Bld 430 (H) 70 - 99 mg/dL   BUN 46 (H) 8 - 23 mg/dL   Creatinine, Ser 8.58 (H) 0.44 - 1.00 mg/dL   Calcium  10.4 (H) 8.9 - 10.3 mg/dL   GFR, Estimated 35 (L) >60 mL/min   Anion gap 19 (H) 5 - 15  Troponin T, High Sensitivity     Status: Abnormal   Collection Time: 11/23/24  3:03 PM  Result Value Ref Range   Troponin T High Sensitivity 29 (H) 0 - 19 ng/L  CBG monitoring, ED     Status: Abnormal   Collection Time: 11/23/24  3:48 PM  Result Value Ref Range   Glucose-Capillary 569 (HH) 70 - 99 mg/dL   Comment 1 RN   CBG monitoring, ED     Status: Abnormal   Collection Time: 11/23/24  5:04 PM  Result Value Ref Range   Glucose-Capillary 438 (H) 70 - 99 mg/dL  CBG monitoring, ED     Status: Abnormal   Collection Time: 11/23/24  6:20 PM  Result Value  Ref Range   Glucose-Capillary 378 (H) 70 - 99 mg/dL  CBG monitoring, ED     Status: Abnormal   Collection Time: 11/23/24  7:46 PM  Result Value Ref Range   Glucose-Capillary 392 (H) 70 - 99 mg/dL  Glucose, capillary     Status: Abnormal   Collection Time: 11/23/24  8:46 PM  Result Value Ref Range   Glucose-Capillary 310 (H) 70 - 99 mg/dL  Basic metabolic panel     Status: Abnormal   Collection Time: 11/23/24  9:04 PM  Result Value Ref Range   Sodium 134 (L) 135 - 145 mmol/L   Potassium 3.9 3.5 - 5.1 mmol/L   Chloride 89 (L) 98 - 111 mmol/L   CO2 30 22 - 32 mmol/L   Glucose, Bld 243 (H) 70 - 99 mg/dL   BUN 43 (H) 8 - 23 mg/dL   Creatinine, Ser 8.51 (H) 0.44 - 1.00 mg/dL   Calcium  9.9 8.9 - 10.3 mg/dL   GFR, Estimated 33 (L) >60 mL/min   Anion gap 15 5 - 15   *Note: Due to a large number of results and/or encounters for the requested time period, some results have not been displayed. A complete set of results can be found in Results Review.   CT Cervical Spine Wo Contrast Result Date: 11/23/2024 EXAM: CT CERVICAL SPINE WITHOUT CONTRAST 11/23/2024 01:51:04 PM TECHNIQUE: CT of the cervical spine was performed without the administration of intravenous contrast. Multiplanar reformatted images are provided for review. Automated exposure control, iterative reconstruction, and/or weight based adjustment of the mA/kV was utilized to reduce the radiation dose to as low as reasonably achievable. COMPARISON: None available. CLINICAL HISTORY: Fall. FINDINGS: CERVICAL SPINE: BONES AND ALIGNMENT: Straightening of the normal cervical lordosis. Trace degenerative anterolisthesis of C4 on C5. Partial fusion of the C5 and C6 vertebral bodies. Diffuse osteopenia. Asymmetric spacing of the dens and lateral masses of C1 likely related to asymmetric degenerative changes at the atlantoaxial articulations. No acute fracture or traumatic malalignment. DEGENERATIVE CHANGES: Disc osteophyte complexes at multiple  levels. Facet arthrosis. No vertebral hypertrophy throughout the cervical spine. There is no high grade osseous spinal canal stenosis. There is significant foraminal stenosis at multiple levels particularly on the right at C3-C4 and on the left at C4-C5. SOFT TISSUES: No prevertebral soft tissue swelling. LUNGS: Centrilobular emphysema. IMPRESSION: 1. No acute abnormality of the cervical spine. 2. Degenerative changes as above. 3. Asymmetric spacing of the dens and lateral masses of C1, likely related to asymmetric degenerative changes at the atlantoaxial articulations. 4. Emphysema. Electronically signed by: Donnice Mania MD 11/23/2024 02:40 PM EST RP Workstation: [id number]   CT Head Wo Contrast Result Date: 11/23/2024 EXAM: CT HEAD WITHOUT 11/23/2024 01:49:31 PM TECHNIQUE: CT of the head was performed without the administration of intravenous contrast. Automated exposure control, iterative reconstruction, and/or weight based adjustment of the mA/kV was utilized to reduce the radiation dose to as low as reasonably achievable. COMPARISON: 05/24/2017 CLINICAL HISTORY: Head trauma, minor (Age >= 65y); Fall on thinners, AMS FINDINGS: BRAIN AND VENTRICLES: No acute intracranial  hemorrhage. No mass effect or midline shift. No extra-axial fluid collection. No evidence of acute infarct. No hydrocephalus. Moderate chronic microvascular ischemic change with generalized volume loss. Bilateral basal ganglia mineralization. 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 ORBITS: Bilateral lens replacement. SINUSES AND MASTOIDS: No acute abnormality. SOFT TISSUES AND SKULL: No acute skull fracture. No acute soft tissue abnormality. IMPRESSION: 1. No acute intracranial abnormality. 2. 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 3. Moderate chronic microvascular ischemic change with generalized volume loss. Electronically signed by:  Donnice Mania MD 11/23/2024 02:15 PM EST RP Workstation: [id number]   DG Chest 2 View Result Date: 11/23/2024 CLINICAL DATA:  Confusion and cough EXAM: CHEST - 2 VIEW COMPARISON:  Chest radiograph dated 01/03/2024 FINDINGS: Unchanged elevation of the right hemidiaphragm. Normal lung volumes. No focal consolidations. No pleural effusion or pneumothorax. Enlarged cardiomediastinal silhouette is likely projectional. No acute osseous abnormality. IMPRESSION: No active cardiopulmonary disease. Electronically Signed   By: Limin  Xu M.D.   On: 11/23/2024 13:56   EKG: A-fib, RBBB, LAFB, LVH no acute ST-T wave changes.  Assessment and Plan: DMII (diabetes mellitus, type 2) (HCC) Will need to resume her home medications when she is out of of HHS.  UTI (urinary tract infection) Check urine culture IV Rocephin   Acute kidney injury superimposed on stage 3a chronic kidney disease (HCC) Baseline creatinine is 0.83 Suspect this is related to her HHS, gentle IV hydration Avoid nephrotoxic agents Trend   Chronic diastolic heart failure (HCC) Appears euvolemic  ATRIAL FIBRILLATION Currently rate controlled Continue Eliquis    Coronary atherosclerosis Has nitroglycerin  as needed  HTN (hypertension) Continue amlodipine , Coreg   HYPERLIPIDEMIA Continue low-cholesterol diet      Advance Care Planning:   Code Status: Prior please get back and confirm with patient when she is more awake and alert  Consults: None  Family Communication: Patient at bedside  Severity of Illness: The appropriate patient status for this patient is INPATIENT. Inpatient status is judged to be reasonable and necessary in order to provide the required intensity of service to ensure the patient's safety. The patient's presenting symptoms, physical exam findings, and initial radiographic and laboratory data in the context of their chronic comorbidities is felt to place them at high risk for further clinical deterioration.  Furthermore, it is not anticipated that the patient will be medically stable for discharge from the hospital within 2 midnights of admission.   * I certify that at the point of admission it is my clinical judgment that the patient will require inpatient hospital care spanning beyond 2 midnights from the point of admission due to high intensity of service, high risk for further deterioration and high frequency of surveillance required.*  Author: Glenys GORMAN Birk, MD 11/23/2024 9:09 PM  For on call review [email or web address].

## 2024-11-23 NOTE — Assessment & Plan Note (Signed)
-  Continue amlodipine, Coreg 

## 2024-11-23 NOTE — Hospital Course (Signed)
 Patient is a 89 year old with history of T2DM, COPD, HLD, HTN, CAD, A-fib on Eliquis , pulmonary hypertension, OSA, CKD 3A, PAD, and CHFpEF who presents to the [facility] ED with altered mental status.  The patient had been placed on prednisone  approximately 8 days ago for suspected COPD exacerbation.  In the ED she was noted to be confused, and found to have hyperglycemic hyperosmolar state.  Her sugar was greater than 600, she had positive beta hydroxybutyrate and anion gap that was elevated with a normal bicarb.  Patient was given IV fluid bolus as well as started on Endo tool.  Urinalysis was also completed and showed positive leuks with concern for infection and she was started on p.o. Macrobid.  Urine culture was obtained.

## 2024-11-23 NOTE — ED Notes (Signed)
 Patient transported to CT

## 2024-11-24 DIAGNOSIS — E1110 Type 2 diabetes mellitus with ketoacidosis without coma: Secondary | ICD-10-CM | POA: Diagnosis present

## 2024-11-24 DIAGNOSIS — N179 Acute kidney failure, unspecified: Secondary | ICD-10-CM | POA: Diagnosis not present

## 2024-11-24 DIAGNOSIS — L8990 Pressure ulcer of unspecified site, unspecified stage: Secondary | ICD-10-CM | POA: Insufficient documentation

## 2024-11-24 DIAGNOSIS — I480 Paroxysmal atrial fibrillation: Secondary | ICD-10-CM

## 2024-11-24 DIAGNOSIS — N3001 Acute cystitis with hematuria: Secondary | ICD-10-CM | POA: Diagnosis not present

## 2024-11-24 LAB — BASIC METABOLIC PANEL WITH GFR
Anion gap: 11 (ref 5–15)
Anion gap: 7 (ref 5–15)
Anion gap: 10 (ref 5–15)
BUN: 38 mg/dL — ABNORMAL HIGH (ref 8–23)
BUN: 29 mg/dL — ABNORMAL HIGH (ref 8–23)
BUN: 25 mg/dL — ABNORMAL HIGH (ref 8–23)
CO2: 30 mmol/L (ref 22–32)
CO2: 32 mmol/L (ref 22–32)
CO2: 30 mmol/L (ref 22–32)
Calcium: 9.3 mg/dL (ref 8.9–10.3)
Calcium: 9.7 mg/dL (ref 8.9–10.3)
Calcium: 9.9 mg/dL (ref 8.9–10.3)
Chloride: 93 mmol/L — ABNORMAL LOW (ref 98–111)
Chloride: 95 mmol/L — ABNORMAL LOW (ref 98–111)
Chloride: 93 mmol/L — ABNORMAL LOW (ref 98–111)
Creatinine, Ser: 1.21 mg/dL — ABNORMAL HIGH (ref 0.44–1.00)
Creatinine, Ser: 1.05 mg/dL — ABNORMAL HIGH (ref 0.44–1.00)
Creatinine, Ser: 1.16 mg/dL — ABNORMAL HIGH (ref 0.44–1.00)
GFR, Estimated: 42 mL/min — ABNORMAL LOW (ref >60)
GFR, Estimated: 50 mL/min — ABNORMAL LOW (ref >60)
GFR, Estimated: 44 mL/min — ABNORMAL LOW (ref >60)
Glucose, Bld: 169 mg/dL — ABNORMAL HIGH (ref 70–99)
Glucose, Bld: 208 mg/dL — ABNORMAL HIGH (ref 70–99)
Glucose, Bld: 191 mg/dL — ABNORMAL HIGH (ref 70–99)
Potassium: 4.2 mmol/L (ref 3.5–5.1)
Potassium: 4.1 mmol/L (ref 3.5–5.1)
Potassium: 4.2 mmol/L (ref 3.5–5.1)
Sodium: 134 mmol/L — ABNORMAL LOW (ref 135–145)
Sodium: 134 mmol/L — ABNORMAL LOW (ref 135–145)
Sodium: 133 mmol/L — ABNORMAL LOW (ref 135–145)

## 2024-11-24 LAB — I-STAT VENOUS BLOOD GAS, ED
Acid-Base Excess: 7 mmol/L — ABNORMAL HIGH (ref 0.0–2.0)
Bicarbonate: 33.1 mmol/L — ABNORMAL HIGH (ref 20.0–28.0)
Calcium, Ion: 1.16 mmol/L (ref 1.15–1.40)
HCT: 50 % — ABNORMAL HIGH (ref 36.0–46.0)
Hemoglobin: 17 g/dL — ABNORMAL HIGH (ref 12.0–15.0)
O2 Saturation: 46 %
Patient temperature: 98.7
Potassium: 5.5 mmol/L — ABNORMAL HIGH (ref 3.5–5.1)
Sodium: 125 mmol/L — ABNORMAL LOW (ref 135–145)
TCO2: 35 mmol/L — ABNORMAL HIGH (ref 22–32)
pCO2, Ven: 51.4 mmHg (ref 44–60)
pH, Ven: 7.416 (ref 7.25–7.43)
pO2, Ven: 25 mmHg — CL (ref 32–45)

## 2024-11-24 LAB — URINE CULTURE

## 2024-11-24 LAB — GLUCOSE, CAPILLARY
Glucose-Capillary: 128 mg/dL — ABNORMAL HIGH (ref 70–99)
Glucose-Capillary: 144 mg/dL — ABNORMAL HIGH (ref 70–99)
Glucose-Capillary: 150 mg/dL — ABNORMAL HIGH (ref 70–99)
Glucose-Capillary: 151 mg/dL — ABNORMAL HIGH (ref 70–99)
Glucose-Capillary: 175 mg/dL — ABNORMAL HIGH (ref 70–99)
Glucose-Capillary: 176 mg/dL — ABNORMAL HIGH (ref 70–99)
Glucose-Capillary: 186 mg/dL — ABNORMAL HIGH (ref 70–99)
Glucose-Capillary: 270 mg/dL — ABNORMAL HIGH (ref 70–99)
Glucose-Capillary: 286 mg/dL — ABNORMAL HIGH (ref 70–99)
Glucose-Capillary: 344 mg/dL — ABNORMAL HIGH (ref 70–99)

## 2024-11-24 LAB — CBC
HCT: 43.8 % (ref 36.0–46.0)
Hemoglobin: 14.2 g/dL (ref 12.0–15.0)
MCH: 30.4 pg (ref 26.0–34.0)
MCHC: 32.4 g/dL (ref 30.0–36.0)
MCV: 93.8 fL (ref 80.0–100.0)
Platelets: 172 K/uL (ref 150–400)
RBC: 4.67 MIL/uL (ref 3.87–5.11)
RDW: 12.1 % (ref 11.5–15.5)
WBC: 9.8 K/uL (ref 4.0–10.5)
nRBC: 0 % (ref 0.0–0.2)

## 2024-11-24 LAB — OSMOLALITY: Osmolality: 297 mosm/kg — ABNORMAL HIGH (ref 275–295)

## 2024-11-24 MED ORDER — INSULIN GLARGINE-YFGN 100 UNIT/ML ~~LOC~~ SOLN: 7.0000 [IU] | Freq: Every day | SUBCUTANEOUS | Status: DC

## 2024-11-24 MED ORDER — INSULIN ASPART 100 UNIT/ML IJ SOLN
0.0000 [IU] | Freq: Every day | INTRAMUSCULAR | Status: DC
  Administered 2024-11-24: 4 [IU] via SUBCUTANEOUS
  Filled 2024-11-24: qty 4

## 2024-11-24 MED ORDER — PHENOL 1.4 % MT LIQD
2.0000 | OROMUCOSAL | Status: DC | PRN
  Administered 2024-11-24: 2 via OROMUCOSAL
  Filled 2024-11-24: qty 177

## 2024-11-24 MED ORDER — INSULIN ASPART 100 UNIT/ML IJ SOLN: 0.0000 [IU] | Freq: Every day | INTRAMUSCULAR | Status: DC

## 2024-11-24 MED ORDER — GUAIFENESIN-DM 100-10 MG/5ML PO SYRP
5.0000 mL | ORAL_SOLUTION | ORAL | Status: DC | PRN
  Administered 2024-11-24: 5 mL via ORAL
  Filled 2024-11-24: qty 5

## 2024-11-24 MED ORDER — INSULIN GLARGINE-YFGN 100 UNIT/ML ~~LOC~~ SOLN
5.0000 [IU] | Freq: Once | SUBCUTANEOUS | Status: AC
  Administered 2024-11-24: 5 [IU] via SUBCUTANEOUS
  Filled 2024-11-24: qty 0.05

## 2024-11-24 MED ORDER — INSULIN GLARGINE-YFGN 100 UNIT/ML ~~LOC~~ SOLN
7.0000 [IU] | Freq: Every day | SUBCUTANEOUS | Status: DC
  Filled 2024-11-24: qty 0.07

## 2024-11-24 MED ORDER — ORAL CARE MOUTH RINSE: 15.0000 mL | OROMUCOSAL | Status: DC | PRN

## 2024-11-24 MED ORDER — INSULIN ASPART 100 UNIT/ML IJ SOLN
0.0000 [IU] | Freq: Three times a day (TID) | INTRAMUSCULAR | Status: DC
  Administered 2024-11-24: 5 [IU] via SUBCUTANEOUS
  Administered 2024-11-24: 1 [IU] via SUBCUTANEOUS
  Filled 2024-11-24: qty 5
  Filled 2024-11-24: qty 1

## 2024-11-24 MED ORDER — TORSEMIDE 20 MG PO TABS
40.0000 mg | ORAL_TABLET | Freq: Every day | ORAL | Status: DC
  Administered 2024-11-24 – 2024-11-26 (×3): 40 mg via ORAL
  Filled 2024-11-24 (×3): qty 2

## 2024-11-24 MED ORDER — INSULIN ASPART 100 UNIT/ML IJ SOLN
0.0000 [IU] | Freq: Three times a day (TID) | INTRAMUSCULAR | Status: DC
  Administered 2024-11-24: 3 [IU] via SUBCUTANEOUS
  Administered 2024-11-25 (×2): 5 [IU] via SUBCUTANEOUS
  Administered 2024-11-25: 3 [IU] via SUBCUTANEOUS
  Administered 2024-11-26: 11 [IU] via SUBCUTANEOUS
  Filled 2024-11-24: qty 11
  Filled 2024-11-24 (×2): qty 5
  Filled 2024-11-24 (×3): qty 3

## 2024-11-24 MED ORDER — INSULIN ASPART 100 UNIT/ML IJ SOLN: 0.0000 [IU] | Freq: Three times a day (TID) | INTRAMUSCULAR | Status: DC

## 2024-11-24 MED ORDER — INSULIN ASPART 100 UNIT/ML IJ SOLN
5.0000 [IU] | Freq: Once | INTRAMUSCULAR | Status: AC
  Administered 2024-11-24: 5 [IU] via SUBCUTANEOUS
  Filled 2024-11-24: qty 5

## 2024-11-24 MED ORDER — MELATONIN 3 MG PO TABS
3.0000 mg | ORAL_TABLET | Freq: Every day | ORAL | Status: DC
  Administered 2024-11-24 – 2024-11-25 (×2): 3 mg via ORAL
  Filled 2024-11-24 (×2): qty 1

## 2024-11-24 NOTE — Evaluation (Signed)
 Occupational Therapy Evaluation Patient Details Name: [name] MRN: [id number] DOB: March 16, 1931 Today's Date: 11/24/2024   History of Present Illness   [name] is a 89 yr old female admitted to the hospital with altered mental status. She was found to have hyperglycemia and AKI on CKD III. PMH: DM II, COPD, HLD, HTN, CAD, a fib, pulmonary HTN, OSA, CKD III, PAD, CHF, arthritis, asthma, DJD, MI, OP     Clinical Impressions The pt is currently presenting slightly below her baseline level of functioning for self-care management. She is limited by the below listed deficits (see OT problem list). During the session today, she required SBA to CGA for tasks including lower body dressing, sit to stand using a RW, and toileting management at bathroom level. She will benefit from further OT services to maximize her independence with ADLs and to facilitate her safe discharge from the hospital. No post-acute care therapy needs are anticipated.      If plan is discharge home, recommend the following:   Assistance with cooking/housework;Assist for transportation     Functional Status Assessment   Patient has had a recent decline in their functional status and demonstrates the ability to make significant improvements in function in a reasonable and predictable amount of time.     Equipment Recommendations   Tub/shower seat     Recommendations for Other Services         Precautions/Restrictions   Restrictions Other Position/Activity Restrictions: She used 3L O2 at night & as needed during the day     Mobility Bed Mobility Overal bed mobility: Needs Assistance Bed Mobility: Supine to Sit     Supine to sit: Supervision          Transfers Overall transfer level: Needs assistance Equipment used: Rolling walker (2 wheels) Transfers: Sit to/from Stand Sit to Stand: Supervision                  Balance     Sitting balance-Leahy Scale: Good        Standing balance-Leahy Scale: Fair            ADL either performed or assessed with clinical judgement   ADL Overall ADL's : Needs assistance/impaired Eating/Feeding: Independent;Sitting   Grooming: Set up;Sitting;Contact guard assist;Standing Grooming Details (indicate cue type and reason): set-up seated or CGA standing at the sink Upper Body Bathing: Set up;Sitting   Lower Body Bathing: Contact guard assist;Sit to/from stand   Upper Body Dressing : Set up;Sitting   Lower Body Dressing: Contact guard assist;Sit to/from stand   Toilet Transfer: Contact guard assist;Rolling walker (2 wheels);Ambulation Toilet Transfer Details (indicate cue type and reason): She ambulated to and from the bathroom in her room using a RW. Toileting- Clothing Manipulation and Hygiene: Contact guard assist;Sit to/from stand Toileting - Clothing Manipulation Details (indicate cue type and reason): She performed toileting management at bathroom level.             Pertinent Vitals/Pain Pain Assessment Pain Assessment: No/denies pain     Extremity/Trunk Assessment Upper Extremity Assessment Upper Extremity Assessment: Overall WFL for tasks assessed (BUE AROM and strength WFL)   Lower Extremity Assessment Lower Extremity Assessment: Overall WFL for tasks assessed (BLE AROM and strength WFL)       Communication Communication Communication: No apparent difficulties   Cognition Arousal: Alert Behavior During Therapy: WFL for tasks assessed/performed Cognition: No apparent impairments             OT - Cognition Comments:  Oriented x4                 Following commands: Intact       Cueing  General Comments   Cueing Techniques: Verbal cues              Home Living Family/patient expects to be discharged to:: Other (Comment) [facility], IL[facility]) Living Arrangements: Alone Available Help at Discharge: Family;Available PRN/intermittently Type of Home:  Apartment Home Access: Level entry     Home Layout: One level     Bathroom Shower/Tub: Runner, Broadcasting/film/video: Rollator (4 wheels);Cane - single point   Additional Comments: She used 3L O2 at night and as needed during the day      Prior Functioning/Environment Prior Level of Function : Independent/Modified Independent             Mobility Comments: She used a rollator as needed. ADLs Comments: She was modified independent to independent with ADLs. Meals and cleaning services were provided by the facility. She is a former Engineer, mining.    OT Problem List: Decreased activity tolerance;Decreased strength;Cardiopulmonary status limiting activity   OT Treatment/Interventions: Self-care/ADL training;Therapeutic exercise;Energy conservation;DME and/or AE instruction;Therapeutic activities;Balance training;Patient/family education      OT Goals(Current goals can be found in the care plan section)   Acute Rehab OT Goals OT Goal Formulation: With patient Time For Goal Achievement: 12/08/24 Potential to Achieve Goals: Good ADL Goals Pt Will Perform Grooming: with modified independence;standing Pt Will Perform Lower Body Dressing: with modified independence;sit to/from stand Pt Will Transfer to Toilet: with modified independence;ambulating Pt Will Perform Toileting - Clothing Manipulation and hygiene: with modified independence;sit to/from stand   OT Frequency:  Min 2X/week       AM-PAC OT 6 Clicks Daily Activity     Outcome Measure Help from another person eating meals?: None Help from another person taking care of personal grooming?: A Little Help from another person toileting, which includes using toliet, bedpan, or urinal?: A Little Help from another person bathing (including washing, rinsing, drying)?: A Little Help from another person to put on and taking off regular upper body clothing?: None Help from another person to put on and  taking off regular lower body clothing?: A Little 6 Click Score: 20   End of Session Equipment Utilized During Treatment: Rolling walker (2 wheels);Oxygen  Nurse Communication: Mobility status  Activity Tolerance: Patient tolerated treatment well Patient left: in chair;with call bell/phone within reach  OT Visit Diagnosis: Muscle weakness (generalized) (M62.81)                Time: 8751-8671 OT Time Calculation (min): 40 min Charges:  OT General Charges $OT Visit: 1 Visit OT Evaluation $OT Eval Low Complexity: 1 Low OT Treatments $Self Care/Home Management : 8-22 mins    Delanna [name] Lesches, OTR/L 11/24/2024, 2:56 PM

## 2024-11-24 NOTE — Progress Notes (Addendum)
 [facility]                                                                              [name], is a 89 y.o. female, DOB - 05/26/1931, [name] Admit date - 11/23/2024    Outpatient Primary MD for the patient is Geofm, Glade PARAS, MD  LOS - 1  days  Chief Complaint  Patient presents with   Altered Mental Status       Brief summary   Patient is a 89 year old female with diabetes mellitus type 2, COPD, HLD, HTN, CAD, A-fib on Eliquis , pulmonary hypertension, OSA, CKD 3A, PAD, and CHFpEF presented to ED with altered mental status.  Patient had been placed on prednisone  on 11/15/2024 for COPD exacerbation. In the ED she was noted to be confused, and found to have blood sugar greater than 600.  UA showed ketones. UA positive for UTI.  Creatinine 1.52 on admission,Na sodium, 127, K5.9 Patient was placed on IV insulin  drip and IV fluids.  Admitted for further workup.  Assessment & Plan       DKA (diabetic ketoacidosis) (HCC) in the setting of type 2 diabetes mellitus uncontrolled hyperglycemia - Presented with blood glucose of 604, hyponatremia, hyperkalemia, anion gap of 20 UA positive for ketones, BHB 3.61 likely respiratory due to steroids for COPD exacerbation -Outpatient on NovoLog  sliding scale insulin  and Ozempic  - Hemoglobin A1c was 8.7 on 9//25, follow HbA1c - Patient was placed on IV fluids and insulin  drip, now transitioned to subcutaneous insulin  - Transitioned to moderate sliding scale insulin , received 1 dose of Semglee  5 units this morning - Diabetic coordinator consulted    Acute kidney injury superimposed on stage 3a chronic kidney disease (HCC) - Creatinine 1.52 on admission, in the setting of DKA, UTI, on torsemide  outpatient.  Baseline creatinine 0.8 on 10/08/2023 - Creatinine now improving, 1.0 this morning  Pseudohyponatremia, hyperkalemia secondary to DKA - Improving, sodium 127 on admission-> 125-> 134 today,  K 5.9 on  admission - Sodium, potassium improving. Na 134, K4.1    UTI (urinary tract infection) - Follow urine culture and sensitivities, placed on IV Rocephin      Chronic diastolic heart failure (HCC) -Currently stable, euvolemic, outpatient on torsemide  40 mg twice daily, follows Dr. Bensimhon -Creatinine now improving,  - Will stop fluids, resume torsemide  40 mg daily to avoid fluid overload.  Resume outpatient dose tomorrow  Chronic atrial fibrillation - Rate controlled, continue Coreg , Eliquis   CAD, hyperlipidemia, -Continue amlodipine , Coreg , -Not on any statins       HTN (hypertension) -Continue amlodipine , Coreg  -BP elevated, resume torsemide  40 mg daily    Pressure injury of skin, POA  Wound 11/23/24 2030 Pressure Injury Sacrum Right;Medial Stage 1 -  Intact skin with non-blanchable redness of a localized area usually over a bony prominence. (Active)    Estimated body mass index is 24.95 kg/m as calculated from the following:   Height as of this encounter: 5' 3 (1.6 m).   Weight as of this encounter: 63.9 kg.  Code Status: Full CODE STATUS DVT Prophylaxis:   apixaban  (ELIQUIS ) tablet 5 mg   Level of Care: Level  of care: Telemetry Family Communication: Updated patient Disposition Plan:      Remains inpatient appropriate: PT OT evaluation, will transfer to the floor today, possible DC tomorrow   Procedures:    Consultants:     Antimicrobials:   Anti-infectives (From admission, onward)    Start     Dose/Rate Route Frequency Ordered Stop   11/23/24 2230  cefTRIAXone  (ROCEPHIN ) 1 g in sodium chloride  0.9 % 100 mL IVPB        1 g 200 mL/hr over 30 Minutes Intravenous Every 24 hours 11/23/24 2142 11/28/24 2229   11/23/24 1445  nitrofurantoin (macrocrystal-monohydrate) (MACROBID) capsule 100 mg        100 mg Oral  Once 11/23/24 1431 11/23/24 1605          Medications  amLODipine   5 mg Oral Daily   apixaban   5 mg Oral BID   arformoterol  15 mcg  Nebulization BID   And   umeclidinium bromide   1 puff Inhalation Daily   carvedilol   6.25 mg Oral BID WC   Chlorhexidine  Gluconate Cloth  6 each Topical Daily   cholecalciferol   1,000 Units Oral Daily   insulin  aspart  0-5 Units Subcutaneous QHS   insulin  aspart  0-9 Units Subcutaneous TID WC      Subjective:   [name] was seen and examined today.  Pleasant and cooperative, no acute complaints.  No chest pain, shortness of breath, fevers or chills.  No abdominal pain.  Appears alert and oriented, close to her baseline mental status  Objective:   Vitals:   11/24/24 0744 11/24/24 0747 11/24/24 0800 11/24/24 0853  BP:   (!) 165/81   Pulse:   89   Resp:   (!) 23   Temp:    97.7 F (36.5 C)  TempSrc:    Oral  SpO2: 100% 97% (!) 87%   Weight:      Height:        Intake/Output Summary (Last 24 hours) at 11/24/2024 1032 Last data filed at 11/24/2024 0725 Gross per 24 hour  Intake 1608.21 ml  Output 400 ml  Net 1208.21 ml     Wt Readings from Last 3 Encounters:  11/23/24 63.9 kg  11/15/24 67.7 kg  11/07/24 68.5 kg     Exam General: Alert and oriented x 3, NAD Cardiovascular: S1 S2 auscultated,  RRR Respiratory: Diminished breath sounds at the bases, no wheezing Gastrointestinal: Soft, nontender, nondistended, + bowel sounds Ext: no pedal edema bilaterally Neuro: No new deficits Psych: Normal affect     Data Reviewed:  I have personally reviewed following labs    CBC Lab Results  Component Value Date   WBC 9.8 11/24/2024   RBC 4.67 11/24/2024   HGB 14.2 11/24/2024   HCT 43.8 11/24/2024   MCV 93.8 11/24/2024   MCH 30.4 11/24/2024   PLT 172 11/24/2024   MCHC 32.4 11/24/2024   RDW 12.1 11/24/2024   LYMPHSABS 0.8 11/23/2024   MONOABS 0.6 11/23/2024   EOSABS 0.0 11/23/2024   BASOSABS 0.0 11/23/2024     Last metabolic panel Lab Results  Component Value Date   NA 134 (L) 11/24/2024   K 4.1 11/24/2024   CL 95 (L) 11/24/2024   CO2 32  11/24/2024   BUN 29 (H) 11/24/2024   CREATININE 1.05 (H) 11/24/2024   GLUCOSE 208 (H) 11/24/2024   GFRNONAA 50 (L) 11/24/2024   GFRAA 46 (L) 06/09/2020   CALCIUM  9.7 11/24/2024   PHOS 3.2 02/05/2021  PROT 8.1 11/23/2024   ALBUMIN  4.0 11/23/2024   BILITOT 0.9 11/23/2024   ALKPHOS 113 11/23/2024   AST 40 11/23/2024   ALT 23 11/23/2024   ANIONGAP 7 11/24/2024    CBG (last 3)  Recent Labs    11/24/24 0401 11/24/24 0459 11/24/24 0658  GLUCAP 128* 150* 144*      Coagulation Profile: No results for input(s): INR, PROTIME in the last 168 hours.   Radiology Studies: I have personally reviewed the imaging studies  CT Cervical Spine Wo Contrast Result Date: 11/23/2024 EXAM: CT CERVICAL SPINE WITHOUT CONTRAST 11/23/2024 01:51:04 PM TECHNIQUE: CT of the cervical spine was performed without the administration of intravenous contrast. Multiplanar reformatted images are provided for review. Automated exposure control, iterative reconstruction, and/or weight based adjustment of the mA/kV was utilized to reduce the radiation dose to as low as reasonably achievable. COMPARISON: None available. CLINICAL HISTORY: Fall. FINDINGS: CERVICAL SPINE: BONES AND ALIGNMENT: Straightening of the normal cervical lordosis. Trace degenerative anterolisthesis of C4 on C5. Partial fusion of the C5 and C6 vertebral bodies. Diffuse osteopenia. Asymmetric spacing of the dens and lateral masses of C1 likely related to asymmetric degenerative changes at the atlantoaxial articulations. No acute fracture or traumatic malalignment. DEGENERATIVE CHANGES: Disc osteophyte complexes at multiple levels. Facet arthrosis. No vertebral hypertrophy throughout the cervical spine. There is no high grade osseous spinal canal stenosis. There is significant foraminal stenosis at multiple levels particularly on the right at C3-C4 and on the left at C4-C5. SOFT TISSUES: No prevertebral soft tissue swelling. LUNGS: Centrilobular  emphysema. IMPRESSION: 1. No acute abnormality of the cervical spine. 2. Degenerative changes as above. 3. Asymmetric spacing of the dens and lateral masses of C1, likely related to asymmetric degenerative changes at the atlantoaxial articulations. 4. Emphysema. Electronically signed by: [name] Mania MD 11/23/2024 02:40 PM EST RP Workstation: [id number]   CT Head Wo Contrast Result Date: 11/23/2024 EXAM: CT HEAD WITHOUT 11/23/2024 01:49:31 PM TECHNIQUE: CT of the head was performed without the administration of intravenous contrast. Automated exposure control, iterative reconstruction, and/or weight based adjustment of the mA/kV was utilized to reduce the radiation dose to as low as reasonably achievable. COMPARISON: 05/24/2017 CLINICAL HISTORY: Head trauma, minor (Age >= 65y); Fall on thinners, AMS FINDINGS: BRAIN AND VENTRICLES: No acute intracranial hemorrhage. No mass effect or midline shift. No extra-axial fluid collection. No evidence of acute infarct. No hydrocephalus. Moderate chronic microvascular ischemic change with generalized volume loss. Bilateral basal ganglia mineralization. 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 ORBITS: Bilateral lens replacement. SINUSES AND MASTOIDS: No acute abnormality. SOFT TISSUES AND SKULL: No acute skull fracture. No acute soft tissue abnormality. IMPRESSION: 1. No acute intracranial abnormality. 2. 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 3. Moderate chronic microvascular ischemic change with generalized volume loss. Electronically signed by: [name] Mania MD 11/23/2024 02:15 PM EST RP Workstation: [id number]   DG Chest 2 View Result Date: 11/23/2024 CLINICAL DATA:  Confusion and cough EXAM: CHEST - 2 VIEW COMPARISON:  Chest radiograph dated 01/03/2024 FINDINGS: Unchanged elevation of the right hemidiaphragm. Normal lung volumes. No focal consolidations. No pleural  effusion or pneumothorax. Enlarged cardiomediastinal silhouette is likely projectional. No acute osseous abnormality. IMPRESSION: No active cardiopulmonary disease. Electronically Signed   By: Limin  Xu M.D.   On: 11/23/2024 13:56       Lawonda Pretlow M.D. [facility] 11/24/2024, 10:32 AM  Available via Epic secure chat 7am-7pm  After 7 pm, please refer to night coverage provider listed on amion.

## 2024-11-24 NOTE — Plan of Care (Signed)
  Problem: Education: Goal: Knowledge of General Education information will improve Description: Including pain rating scale, medication(s)/side effects and non-pharmacologic comfort measures Outcome: Progressing   Problem: Health Behavior/Discharge Planning: Goal: Ability to manage health-related needs will improve Outcome: Progressing   Problem: Clinical Measurements: Goal: Ability to maintain clinical measurements within normal limits will improve Outcome: Progressing Goal: Will remain free from infection Outcome: Progressing Goal: Diagnostic test results will improve Outcome: Progressing Goal: Respiratory complications will improve Outcome: Progressing Goal: Cardiovascular complication will be avoided Outcome: Progressing   Problem: Activity: Goal: Risk for activity intolerance will decrease Outcome: Progressing   Problem: Nutrition: Goal: Adequate nutrition will be maintained Outcome: Progressing   Problem: Coping: Goal: Level of anxiety will decrease Outcome: Progressing   Problem: Elimination: Goal: Will not experience complications related to bowel motility Outcome: Progressing Goal: Will not experience complications related to urinary retention Outcome: Progressing   Problem: Pain Managment: Goal: General experience of comfort will improve and/or be controlled Outcome: Progressing   Problem: Safety: Goal: Ability to remain free from injury will improve Outcome: Progressing   Problem: Skin Integrity: Goal: Risk for impaired skin integrity will decrease Outcome: Progressing   Problem: Education: Goal: Ability to describe self-care measures that may prevent or decrease complications (Diabetes Survival Skills Education) will improve Outcome: Progressing   Problem: Coping: Goal: Ability to adjust to condition or change in health will improve Outcome: Progressing   Problem: Fluid Volume: Goal: Ability to maintain a balanced intake and output will  improve Outcome: Progressing   Problem: Health Behavior/Discharge Planning: Goal: Ability to identify and utilize available resources and services will improve Outcome: Progressing Goal: Ability to manage health-related needs will improve Outcome: Progressing   Problem: Metabolic: Goal: Ability to maintain appropriate glucose levels will improve Outcome: Progressing   Problem: Nutritional: Goal: Maintenance of adequate nutrition will improve Outcome: Progressing Goal: Progress toward achieving an optimal weight will improve Outcome: Progressing   Problem: Skin Integrity: Goal: Risk for impaired skin integrity will decrease Outcome: Progressing   Problem: Tissue Perfusion: Goal: Adequacy of tissue perfusion will improve Outcome: Progressing  Cindy S. Loreli BSN, RN, CCRP, CCRN 11/24/2024 4:05 AM

## 2024-11-25 ENCOUNTER — Encounter: Payer: Self-pay | Admitting: Internal Medicine

## 2024-11-25 DIAGNOSIS — Z794 Long term (current) use of insulin: Secondary | ICD-10-CM

## 2024-11-25 DIAGNOSIS — I5032 Chronic diastolic (congestive) heart failure: Secondary | ICD-10-CM

## 2024-11-25 DIAGNOSIS — I25119 Atherosclerotic heart disease of native coronary artery with unspecified angina pectoris: Secondary | ICD-10-CM

## 2024-11-25 DIAGNOSIS — I4811 Longstanding persistent atrial fibrillation: Secondary | ICD-10-CM

## 2024-11-25 DIAGNOSIS — E1110 Type 2 diabetes mellitus with ketoacidosis without coma: Secondary | ICD-10-CM

## 2024-11-25 DIAGNOSIS — N179 Acute kidney failure, unspecified: Secondary | ICD-10-CM | POA: Diagnosis not present

## 2024-11-25 DIAGNOSIS — E1122 Type 2 diabetes mellitus with diabetic chronic kidney disease: Secondary | ICD-10-CM

## 2024-11-25 DIAGNOSIS — N3000 Acute cystitis without hematuria: Secondary | ICD-10-CM

## 2024-11-25 DIAGNOSIS — N1831 Chronic kidney disease, stage 3a: Secondary | ICD-10-CM

## 2024-11-25 LAB — GLUCOSE, CAPILLARY
Glucose-Capillary: 228 mg/dL — ABNORMAL HIGH (ref 70–99)
Glucose-Capillary: 194 mg/dL — ABNORMAL HIGH (ref 70–99)
Glucose-Capillary: 174 mg/dL — ABNORMAL HIGH (ref 70–99)
Glucose-Capillary: 112 mg/dL — ABNORMAL HIGH (ref 70–99)

## 2024-11-25 LAB — BASIC METABOLIC PANEL WITH GFR
Anion gap: 11 (ref 5–15)
BUN: 21 mg/dL (ref 8–23)
CO2: 30 mmol/L (ref 22–32)
Calcium: 9.5 mg/dL (ref 8.9–10.3)
Chloride: 94 mmol/L — ABNORMAL LOW (ref 98–111)
Creatinine, Ser: 1.06 mg/dL — ABNORMAL HIGH (ref 0.44–1.00)
GFR, Estimated: 49 mL/min — ABNORMAL LOW (ref >60)
Glucose, Bld: 224 mg/dL — ABNORMAL HIGH (ref 70–99)
Potassium: 4 mmol/L (ref 3.5–5.1)
Sodium: 134 mmol/L — ABNORMAL LOW (ref 135–145)

## 2024-11-25 LAB — CBC
HCT: 46.3 % — ABNORMAL HIGH (ref 36.0–46.0)
Hemoglobin: 14.6 g/dL (ref 12.0–15.0)
MCH: 30.3 pg (ref 26.0–34.0)
MCHC: 31.5 g/dL (ref 30.0–36.0)
MCV: 96.1 fL (ref 80.0–100.0)
Platelets: 154 K/uL (ref 150–400)
RBC: 4.82 MIL/uL (ref 3.87–5.11)
RDW: 12.1 % (ref 11.5–15.5)
WBC: 10.4 K/uL (ref 4.0–10.5)
nRBC: 0 % (ref 0.0–0.2)

## 2024-11-25 MED ORDER — INSULIN GLARGINE-YFGN 100 UNIT/ML ~~LOC~~ SOLN
15.0000 [IU] | Freq: Every day | SUBCUTANEOUS | Status: DC
  Administered 2024-11-25 – 2024-11-26 (×2): 15 [IU] via SUBCUTANEOUS
  Filled 2024-11-25 (×2): qty 0.15

## 2024-11-25 MED ORDER — INSULIN ASPART 100 UNIT/ML IJ SOLN
3.0000 [IU] | Freq: Three times a day (TID) | INTRAMUSCULAR | Status: DC
  Administered 2024-11-25 – 2024-11-26 (×4): 3 [IU] via SUBCUTANEOUS
  Filled 2024-11-25 (×5): qty 3

## 2024-11-25 NOTE — Evaluation (Signed)
 Physical Therapy Evaluation Patient Details Name: [name] MRN: [id number] DOB: 08/24/1931 Today's Date: 11/25/2024   SATURATION QUALIFICATIONS: (This note is used to comply with regulatory documentation for home oxygen )  Patient Saturations on Room Air at Rest = 89%  Patient Saturations on [employer] with activity= 87%  Patient Saturations on 3 Liters of oxygen  while Ambulating = 89%    History of Present Illness  89 yr old female admitted to the hospital with altered mental status. She was found to have hyperglycemia and AKI on CKD III. PMH: DM II, COPD, HLD, HTN, CAD, a fib, pulmonary HTN, OSA, CKD III, PAD, CHF, arthritis, asthma, DJD, MI, OP  Clinical Impression  On eval, pt required CGA for mobility. She ambulated ~65 feet with a RW. Pt presents with general weakness and decreased activity tolerance. She reported some dizziness during ambulation-BP 150/58 after walk. Will plan to follow pt during this hospital stay. Will recommend HHPT f/u, if pt is agreeable. Recommend ambulation with nursing or mobility team as able to increase activity.         If plan is discharge home, recommend the following: A little help with walking and/or transfers;A little help with bathing/dressing/bathroom;Assistance with cooking/housework;Assist for transportation;Help with stairs or ramp for entrance   Can travel by private vehicle        Equipment Recommendations None recommended by PT  Recommendations for Other Services       Functional Status Assessment Patient has had a recent decline in their functional status and demonstrates the ability to make significant improvements in function in a reasonable and predictable amount of time.     Precautions / Restrictions Precautions Precautions: Fall Precaution/Restrictions Comments: monitor O2-wears O2 PRN Restrictions Weight Bearing Restrictions Per Provider Order: No      Mobility  Bed Mobility Overal bed mobility: Needs  Assistance Bed Mobility: Supine to Sit     Supine to sit: Supervision, HOB elevated, Used rails. Sats dropped to 87% so placed pt back on [place] O2          Transfers Overall transfer level: Needs assistance Equipment used: Rolling walker (2 wheels) Transfers: Sit to/from Stand Sit to Stand: Contact guard assist                Ambulation/Gait Ambulation/Gait assistance: Contact guard assist Gait Distance (Feet): 65 Feet Assistive device: Rolling walker (2 wheels) Gait Pattern/deviations: Step-through pattern, Decreased stride length       General Gait Details: No overt LOB with RW use. Tolerated distance well but she did report some dizziness-able to make it back to the room-BP ok 150/58. O2: 89% on 3L  Stairs            Wheelchair Mobility     Tilt Bed    Modified Rankin (Stroke Patients Only)       Balance Overall balance assessment: Needs assistance         Standing balance support: Bilateral upper extremity supported, During functional activity, Reliant on assistive device for balance Standing balance-Leahy Scale: Fair                               Pertinent Vitals/Pain Pain Assessment Pain Assessment: No/denies pain    Home Living     Available Help at Discharge: Personal care attendant;Available PRN/intermittently Type of Home: Independent living facility Home Access: Level entry       Home Layout: One level Home Equipment: Rollator (  4 wheels);Cane - single point Additional Comments: She used 3L O2 PRN    Prior Function Prior Level of Function : Independent/Modified Independent             Mobility Comments: ambulates with rollator ADLs Comments: She was modified independent to independent with ADLs. Meals and cleaning services were provided by the facility. She is a former Engineer, mining.     Extremity/Trunk Assessment   Upper Extremity Assessment Upper Extremity Assessment: Defer to OT evaluation     Lower Extremity Assessment Lower Extremity Assessment: Generalized weakness    Cervical / Trunk Assessment Cervical / Trunk Assessment: Kyphotic  Communication   Communication Communication: No apparent difficulties    Cognition Arousal: Alert Behavior During Therapy: WFL for tasks assessed/performed                             Following commands: Intact       Cueing Cueing Techniques: Verbal cues     General Comments      Exercises     Assessment/Plan    PT Assessment Patient needs continued PT services  PT Problem List Decreased strength;Decreased range of motion;Decreased activity tolerance;Decreased mobility;Decreased balance       PT Treatment Interventions DME instruction;Gait training;Functional mobility training;Therapeutic activities;Patient/family education;Balance training    PT Goals (Current goals can be found in the Care Plan section)  Acute Rehab PT Goals Patient Stated Goal: home PT Goal Formulation: With patient Time For Goal Achievement: 12/09/24 Potential to Achieve Goals: Good    Frequency Min 2X/week     Co-evaluation               AM-PAC PT 6 Clicks Mobility  Outcome Measure Help needed turning from your back to your side while in a flat bed without using bedrails?: A Little Help needed moving from lying on your back to sitting on the side of a flat bed without using bedrails?: A Little Help needed moving to and from a bed to a chair (including a wheelchair)?: A Little Help needed standing up from a chair using your arms (e.g., wheelchair or bedside chair)?: A Little Help needed to walk in hospital room?: A Little Help needed climbing 3-5 steps with a railing? : A Little 6 Click Score: 18    End of Session Equipment Utilized During Treatment: Gait belt;Oxygen  Activity Tolerance: Patient tolerated treatment well Patient left: in chair;with call bell/phone within reach   PT Visit Diagnosis: Difficulty in  walking, not elsewhere classified (R26.2)    Time: 9065-9045 PT Time Calculation (min) (ACUTE ONLY): 20 min   Charges:   PT Evaluation $PT Eval Low Complexity: 1 Low   PT General Charges $$ ACUTE PT VISIT: 1 Visit            Dannial SQUIBB, PT Acute Rehabilitation  Office: [phone]

## 2024-11-25 NOTE — Progress Notes (Deleted)
 Subjective:    Patient ID: [name], female    DOB: 1931-06-11, 89 y.o.   MRN: [id number]     HPI Safiya is here for follow up from the hospital   Admitted 11/28 for AMS.  Approximately 8 days prior she was placed on prednisone  for COPD exacerbation.  In ED she was confused and in a hyperglycemic hyperosmolar state.  Her glucose was > 600, positive beta hydroxybutyrate and anion gap elevated with normal bicarb.  She was given IVF, started on Endo tool.  UA showed positive leuks - started on po macrobid.    NIDDM with DKA and hyperglycemia A1c 8.7% IVF, Endo tool DKA resolved Novolg 3 units 3 times a day with meals Semglee  increased from 7 to 15 units daily Continued on home ozempic   AKI on CKD 3a Due to DKA, torsemide  Resolved  Chronic COPD, chronic hypoxic RF On chronic O2 therapy - 3 L at night and with exertion Dry cough but no SOB Continued on bronchodilators Incentive spirometry  Chronic HFmrEF Euvolemeic Continued on torsemide  40 mg bid, coreg   CAD, HLD, Htn Continued amlodipine , coreg   Presumed UTI Started on macrobid - UA concerning for UTI Ucx with multiple species Treated with IV ceftriaxone   Pseudohypnatremia, hyperkalemia secondary to DKA Resolved  Chronic afib Coreg , eliquis     Medications and allergies reviewed with patient and updated if appropriate.  Current Facility-Administered Medications on File Prior to Visit  Medication Dose Route Frequency Provider Last Rate Last Admin   acetaminophen  (TYLENOL ) tablet 650 mg  650 mg Oral Q6H PRN Rai, Ripudeep K, MD       Or   acetaminophen  (TYLENOL ) suppository 650 mg  650 mg Rectal Q6H PRN Rai, Ripudeep K, MD       amLODipine  (NORVASC ) tablet 5 mg  5 mg Oral Daily Rai, Ripudeep K, MD   5 mg at 11/25/24 9185   apixaban  (ELIQUIS ) tablet 5 mg  5 mg Oral BID Rai, Ripudeep K, MD   5 mg at 11/25/24 0814   arformoterol (BROVANA) nebulizer solution 15 mcg  15 mcg Nebulization BID Rai,  Ripudeep K, MD   15 mcg at 11/25/24 9080   And   umeclidinium bromide  (INCRUSE ELLIPTA ) 62.5 MCG/ACT 1 puff  1 puff Inhalation Daily Rai, Ripudeep K, MD   1 puff at 11/25/24 9081   carvedilol  (COREG ) tablet 6.25 mg  6.25 mg Oral BID WC Rai, Ripudeep K, MD   6.25 mg at 11/25/24 9188   cefTRIAXone  (ROCEPHIN ) 1 g in sodium chloride  0.9 % 100 mL IVPB  1 g Intravenous Q24H Rai, Ripudeep K, MD   Stopped at 11/24/24 2309   Chlorhexidine  Gluconate Cloth 2 % PADS 6 each  6 each Topical Daily Rai, Ripudeep K, MD   6 each at 11/25/24 1015   cholecalciferol  (VITAMIN D3) 25 MCG (1000 UNIT) tablet 1,000 Units  1,000 Units Oral Daily Rai, Ripudeep K, MD   1,000 Units at 11/25/24 0814   dextrose  50 % solution 0-50 mL  0-50 mL Intravenous PRN Rai, Ripudeep K, MD       guaiFENesin -dextromethorphan  (ROBITUSSIN DM) 100-10 MG/5ML syrup 5 mL  5 mL Oral Q4H PRN Rai, Ripudeep K, MD   5 mL at 11/24/24 2256   insulin  aspart (novoLOG ) injection 0-15 Units  0-15 Units Subcutaneous TID WC Rai, Ripudeep K, MD   5 Units at 11/25/24 1438   insulin  aspart (novoLOG ) injection 0-5 Units  0-5 Units Subcutaneous QHS Rai, Ripudeep  K, MD   4 Units at 11/24/24 2121   insulin  aspart (novoLOG ) injection 3 Units  3 Units Subcutaneous TID WC Gonfa, Taye T, MD   3 Units at 11/25/24 1440   insulin  glargine-yfgn (SEMGLEE ) injection 15 Units  15 Units Subcutaneous Daily Gonfa, Taye T, MD   15 Units at 11/25/24 1009   ipratropium-albuterol  (DUONEB) 0.5-2.5 (3) MG/3ML nebulizer solution 3 mL  3 mL Nebulization Q6H PRN Rai, Ripudeep K, MD   3 mL at 11/24/24 1621   melatonin tablet 3 mg  3 mg Oral QHS Donati-Garmon, Natalie M, NP   3 mg at 11/24/24 2256   metoprolol tartrate (LOPRESSOR) injection 5 mg  5 mg Intravenous Q5 min PRN Rai, Ripudeep K, MD   5 mg at 11/24/24 0015   nitroGLYCERIN  (NITROSTAT ) SL tablet 0.4 mg  0.4 mg Sublingual Q5 min PRN Rai, Ripudeep K, MD       ondansetron  (ZOFRAN ) tablet 4 mg  4 mg Oral Q6H PRN Rai, Ripudeep K, MD        Or   ondansetron  (ZOFRAN ) injection 4 mg  4 mg Intravenous Q6H PRN Rai, Ripudeep K, MD       Oral care mouth rinse  15 mL Mouth Rinse PRN Rai, Ripudeep K, MD       phenol (CHLORASEPTIC) mouth spray 2 spray  2 spray Mouth/Throat PRN Donati-Garmon, Natalie M, NP   2 spray at 11/24/24 2256   polyethylene glycol (MIRALAX / GLYCOLAX) packet 17 g  17 g Oral Daily PRN Rai, Ripudeep K, MD       torsemide  (DEMADEX ) tablet 40 mg  40 mg Oral Daily Rai, Ripudeep K, MD   40 mg at 11/25/24 9186   Current Outpatient Medications on File Prior to Visit  Medication Sig Dispense Refill   albuterol  (VENTOLIN  HFA) 108 (90 Base) MCG/ACT inhaler Inhale 2 puffs into the lungs every 6 (six) hours as needed for wheezing or shortness of breath. 6.7 g 2   amLODipine  (NORVASC ) 5 MG tablet TAKE 1 TABLET BY MOUTH DAILY 90 tablet 3   apixaban  (ELIQUIS ) 5 MG TABS tablet Take 1 tablet (5 mg total) by mouth 2 (two) times daily. 180 tablet 3   Blood Glucose Monitoring Suppl (ONETOUCH VERIO) w/Device KIT Use as instructed to check blood sugar 1 kit 0   carvedilol  (COREG ) 6.25 MG tablet Take 1 tablet (6.25 mg total) by mouth 2 (two) times daily with a meal. 180 tablet 3   Continuous Glucose Receiver (FREESTYLE LIBRE 3 READER) DEVI Use to check glucose continuously. 1 each 0   Continuous Glucose Sensor (FREESTYLE LIBRE 3 PLUS SENSOR) MISC Inject 1 Device into the skin for continuous glucose monitoring. Change every 15 days 6 each 3   fluticasone  (FLONASE ) 50 MCG/ACT nasal spray Place 1 spray into both nostrils daily as needed for allergies or rhinitis. 16 g 3   glucose blood (ONETOUCH VERIO) test strip CHECK BLOOD SUGAR TWICE DAILY 200 strip 3   insulin  aspart (NOVOLOG  FLEXPEN) 100 UNIT/ML FlexPen Inject 4-6 Units into the skin 3 (three) times daily before meals. 15 mL 3   Insulin  Pen Needle (BD PEN NEEDLE NANO 2ND GEN) 32G X 4 MM MISC Use as directed 3 (three) times daily. 300 each 3   ipratropium (ATROVENT ) 0.06 % nasal spray Place  2 sprays into both nostrils 3 (three) times daily. 15 mL 12   ipratropium-albuterol  (DUONEB) 0.5-2.5 (3) MG/3ML SOLN Take 3 mLs by nebulization every 6 (six) hours as  needed. (Patient taking differently: Take 3 mLs by nebulization every 6 (six) hours as needed (SOB/Wheezing).) 360 mL 1   Lancet Devices (LANCING DEVICE) MISC Use as advised - for Delica Lancets 1 each 0   Lancets (ONETOUCH DELICA PLUS LANCET33G) MISC USE TO MONITOR GLUCOSE  LEVELS TWICE DAILY 200 each 3   Multiple Vitamins-Minerals (CENTRUM SILVER ULTRA WOMENS PO) Take 1 tablet by mouth daily.     nitroGLYCERIN  (NITROSTAT ) 0.4 MG SL tablet Place 1 tablet (0.4 mg total) under the tongue every 5 (five) minutes as needed for chest pain. 25 tablet 3   OXYGEN  Inhale 3 Doses into the lungs as needed. 3 liters     potassium chloride  SA (KLOR-CON  M) 20 MEQ tablet Take 2 tablets (40 mEq total) by mouth daily. 180 tablet 3   pregabalin  (LYRICA ) 50 MG capsule Take 1 capsule (50 mg total) by mouth 2 (two) times daily. (Patient not taking: No sig reported) 60 capsule 2   Semaglutide ,0.25 or 0.5MG /DOS, (OZEMPIC , 0.25 OR 0.5 MG/DOSE,) 2 MG/3ML SOPN Inject 0.5 mg into the skin once a week. 9 mL 3   Tiotropium Bromide -Olodaterol (STIOLTO RESPIMAT ) 2.5-2.5 MCG/ACT AERS Inhale 2 puffs into the lungs daily. 4 g 11   torsemide  (DEMADEX ) 20 MG tablet Take 2 tablets (40 mg total) by mouth 2 (two) times daily. 360 tablet 3   traZODone  (DESYREL ) 50 MG tablet Take 0.5-1 tablets (25-50 mg total) by mouth at bedtime as needed for sleep. 30 tablet 5   Vitamin D , Cholecalciferol , 25 MCG (1000 UT) CAPS Take 1 capsule by mouth daily.       Review of Systems     Objective:  There were no vitals filed for this visit. BP Readings from Last 3 Encounters:  11/25/24 (!) 129/59  11/15/24 (!) 148/60  11/07/24 (!) 170/60   Wt Readings from Last 3 Encounters:  11/23/24 140 lb 14 oz (63.9 kg)  11/15/24 149 lb 3.2 oz (67.7 kg)  11/07/24 151 lb (68.5 kg)   There  is no height or weight on file to calculate BMI.    Physical Exam     Lab Results  Component Value Date   WBC 10.4 11/25/2024   HGB 14.6 11/25/2024   HCT 46.3 (H) 11/25/2024   PLT 154 11/25/2024   GLUCOSE 224 (H) 11/25/2024   CHOL 315 (H) 01/18/2023   TRIG 161.0 (H) 01/18/2023   HDL 54.10 01/18/2023   LDLDIRECT 144.6 02/03/2010   LDLCALC 229 (H) 01/18/2023   ALT 23 11/23/2024   AST 40 11/23/2024   NA 134 (L) 11/25/2024   K 4.0 11/25/2024   CL 94 (L) 11/25/2024   CREATININE 1.06 (H) 11/25/2024   BUN 21 11/25/2024   CO2 30 11/25/2024   TSH 0.77 01/18/2023   INR 1.16 06/28/2016   HGBA1C 8.7 (A) 08/30/2024   MICROALBUR 31.0 (H) 10/11/2024     Assessment & Plan:    See Problem List for Assessment and Plan of chronic medical problems.

## 2024-11-25 NOTE — Plan of Care (Signed)
  Problem: Education: Goal: Knowledge of General Education information will improve Description: Including pain rating scale, medication(s)/side effects and non-pharmacologic comfort measures Outcome: Progressing   Problem: Health Behavior/Discharge Planning: Goal: Ability to manage health-related needs will improve Outcome: Progressing   Problem: Clinical Measurements: Goal: Cardiovascular complication will be avoided Outcome: Progressing   Problem: Activity: Goal: Risk for activity intolerance will decrease Outcome: Progressing   Problem: Nutrition: Goal: Adequate nutrition will be maintained Outcome: Progressing   Problem: Coping: Goal: Level of anxiety will decrease Outcome: Progressing   Problem: Elimination: Goal: Will not experience complications related to bowel motility Outcome: Progressing

## 2024-11-25 NOTE — Plan of Care (Signed)
  Problem: Education: Goal: Knowledge of General Education information will improve Description: Including pain rating scale, medication(s)/side effects and non-pharmacologic comfort measures Outcome: Progressing   Problem: Health Behavior/Discharge Planning: Goal: Ability to manage health-related needs will improve Outcome: Progressing   Problem: Clinical Measurements: Goal: Ability to maintain clinical measurements within normal limits will improve Outcome: Progressing Goal: Will remain free from infection Outcome: Progressing Goal: Diagnostic test results will improve Outcome: Progressing Goal: Respiratory complications will improve Outcome: Progressing Goal: Cardiovascular complication will be avoided Outcome: Progressing   Problem: Activity: Goal: Risk for activity intolerance will decrease Outcome: Progressing   Problem: Nutrition: Goal: Adequate nutrition will be maintained Outcome: Progressing   Problem: Elimination: Goal: Will not experience complications related to bowel motility Outcome: Progressing Goal: Will not experience complications related to urinary retention Outcome: Progressing   Problem: Pain Managment: Goal: General experience of comfort will improve and/or be controlled Outcome: Progressing   Problem: Education: Goal: Ability to describe self-care measures that may prevent or decrease complications (Diabetes Survival Skills Education) will improve Outcome: Progressing Goal: Individualized Educational Video(s) Outcome: Progressing   Problem: Skin Integrity: Goal: Risk for impaired skin integrity will decrease Outcome: Progressing   Problem: Coping: Goal: Ability to adjust to condition or change in health will improve Outcome: Progressing   Problem: Fluid Volume: Goal: Ability to maintain a balanced intake and output will improve Outcome: Progressing   Problem: Metabolic: Goal: Ability to maintain appropriate glucose levels will  improve Outcome: Progressing   Problem: Nutritional: Goal: Maintenance of adequate nutrition will improve Outcome: Progressing Goal: Progress toward achieving an optimal weight will improve Outcome: Progressing   Problem: Tissue Perfusion: Goal: Adequacy of tissue perfusion will improve Outcome: Progressing   Problem: Cardiac: Goal: Ability to maintain an adequate cardiac output will improve Outcome: Progressing   Problem: Respiratory: Goal: Will regain and/or maintain adequate ventilation Outcome: Progressing   Problem: Urinary Elimination: Goal: Ability to achieve and maintain adequate renal perfusion and functioning will improve Outcome: Progressing

## 2024-11-25 NOTE — TOC Initial Note (Addendum)
 Transition of Care [facility]) - Initial/Assessment Note    Patient Details  Name: [name] MRN: [id number] Date of Birth: 07-03-1931  Transition of Care [facility]) CM/SW Contact:    Sonda Manuella Quill, RN Phone Number: 11/25/2024, 2:33 PM  Clinical Narrative:                 IP CM consult for Assisted Living; spoke w/ pt and dtr Armeda Plumb [phone]) in room; pt said she lives at [facility] [place]; she and dtr plan for her to return at d/c; her dtr will provide transportation; insurance/PCP verified; pt denied SDOH risks; she has cane, Rollator, transport chair, shower chair; HHPT/OT/ST w/ [facility] (facility's contracted agency); home oxygen  w/ [employer]; pt has portable concentrator that her dtr will bring at d/c; they agree to recc HHPT and would like to con't services w/ [facility]; Dr Kathrin notified via secure chat; awaiting orders and face to face; spoke w/ Darina at facility; he verified residency and level of care; gave [facility] Angeline Piety [phone]/ fax [phone]; IP CM is following.  Expected Discharge Plan: Home w Home Health Services Barriers to Discharge: Continued Medical Work up   Patient Goals and CMS Choice Patient states their goals for this hospitalization and ongoing recovery are:: home     [place] ownership interest in [facility].provided to:: Patient    Expected Discharge Plan and Services   Discharge Planning Services: CM Consult   Living arrangements for the past 2 months: Independent Living Facility [facility])                 DME Arranged: N/A DME Agency: NA       HH Arranged: NA HH Agency: NA        Prior Living Arrangements/Services Living arrangements for the past 2 months: Independent Living Facility ([name]) Lives with:: Self Patient language and need for interpreter reviewed:: Yes Do you feel safe going back to the place where you live?: Yes      Need for Family Participation in Patient Care: Yes  (Comment) Care giver support system in place?: Yes (comment) Current home services: DME (cane, Rollator, transport chair, shower chair; HHPT/OT/ST w/ [facility]; home oxygen  w/ [employer]) Criminal Activity/Legal Involvement Pertinent to Current Situation/Hospitalization: No - Comment as needed  Activities of Daily Living   ADL Screening (condition at time of admission) Independently performs ADLs?: Yes (appropriate for developmental age) Is the patient deaf or have difficulty hearing?: No Does the patient have difficulty seeing, even when wearing glasses/contacts?: No Does the patient have difficulty concentrating, remembering, or making decisions?: No  Permission Sought/Granted Permission sought to share information with : Case Manager Permission granted to share information with : Yes, Verbal Permission Granted  Share Information with NAME: Case Manager     Permission granted to share info w Relationship: Aiya Keach (dtr) [phone]     Emotional Assessment Appearance:: Appears stated age Attitude/Demeanor/Rapport: Gracious Affect (typically observed): Accepting Orientation: : Oriented to Self, Oriented to Place, Oriented to  Time, Oriented to Situation Alcohol / Substance Use: Not Applicable Psych Involvement: No (comment)  Admission diagnosis:  DKA (diabetic ketoacidosis) (HCC) [E11.10] Acute cystitis with hematuria [N30.01] AKI (acute kidney injury) [N17.9] Diabetic ketoacidosis without coma associated with type 2 diabetes mellitus (HCC) [E11.10] Patient Active Problem List   Diagnosis Date Noted   DKA (diabetic ketoacidosis) (HCC) 11/24/2024   Pressure injury of skin 11/24/2024   Hyperosmolar hyperglycemic state (HHS) (HCC) 11/23/2024   UTI (urinary tract  infection) 11/23/2024   Urinary frequency 08/21/2024   Hypoxia 10/10/2023   COPD with acute exacerbation (HCC) 10/08/2023   Lower abdominal pain 08/30/2023   Chronic combined systolic and diastolic heart failure (HCC)  94/87/7975   Seborrheic keratoses 01/18/2023   Constipation 01/18/2023   Post-viral cough syndrome 01/17/2023   Palliative care encounter 01/17/2023   Bilateral sensorineural hearing loss 01/12/2022   Chronic respiratory failure with hypoxia (HCC) 05/12/2021   Poorly controlled type 2 diabetes mellitus with circulatory disorder (HCC) 01/31/2021   Bilateral impacted cerumen 11/25/2020   Neuropathy 08/19/2020   Acute kidney injury superimposed on stage 3a chronic kidney disease (HCC) 02/20/2020   Sleep difficulties 08/01/2019   Osteoarthritis 08/01/2019   Arthritis of left sacroiliac joint 05/04/2017   Osteopenia 02/17/2017   Piriformis syndrome of left side 02/16/2017   Acquired leg length discrepancy 02/16/2017   COPD GOLD I  01/27/2017   Lumbar radiculopathy, acute 01/27/2017   Granuloma annulare 07/27/2016   Numbness of fingers 07/27/2016   Primary osteoarthritis of right knee 06/27/2016   Rash and nonspecific skin eruption 06/09/2016   DMII (diabetes mellitus, type 2) (HCC) 03/07/2016   Acute on chronic combined systolic (congestive) and diastolic (congestive) heart failure (HCC)    CAP (community acquired pneumonia) 05/24/2015   Chronic diastolic heart failure (HCC) 07/30/2014   Nocturnal hypoxemia 07/09/2014   SOB (shortness of breath) 05/15/2014   Band keratopathy 01/16/2014   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 [place] [address] Phone: [phone] Fax: [phone]  [facility] Delivery) - [place],  [place] - [address] [facility] [address] [place] [address] [place] [address] Phone: [phone] Fax: [phone]  [facility] [address] [place] - [address] [place] [address] Phone: [phone] Fax: [phone]  [facility] - [place], [place] - [address] [place] [address] Phone: [phone] Fax: [phone]  [facility] - [place], [place] - [address] [facility] [place] [employer] [address] [place] [address] Phone: [phone] Fax: [phone]  [facility] [address] [place] [address] Phone: [phone] Fax: [phone]     Social Drivers of Health (SDOH) Social History: SDOH Screenings   Food Insecurity: No Food Insecurity (11/25/2024)  Housing: Low Risk  (11/25/2024)  Transportation Needs: No Transportation Needs (11/25/2024)  Utilities: Not At Risk (11/25/2024)  Alcohol Screen: Low Risk  (09/11/2024)  Depression (PHQ2-9): Low Risk  (10/10/2024)  Financial Resource Strain: Low Risk  (09/11/2024)  Physical Activity: Sufficiently Active (09/11/2024)  Social Connections: Moderately Integrated (11/23/2024)  Stress: No Stress Concern Present (09/11/2024)  Tobacco Use: Medium Risk (11/23/2024)  Health Literacy: Adequate Health Literacy (09/11/2024)   SDOH Interventions: Food Insecurity Interventions: Intervention Not Indicated, Inpatient TOC Housing Interventions: Intervention Not Indicated, Inpatient TOC Transportation Interventions: Intervention Not Indicated, Inpatient TOC Utilities Interventions: Intervention Not Indicated, Inpatient TOC   Readmission Risk Interventions    10/10/2023    3:10 PM  Readmission Risk Prevention Plan  Post Dischage  Appt Complete  Medication Screening Complete  Transportation Screening Complete

## 2024-11-25 NOTE — Progress Notes (Signed)
 PROGRESS NOTE  [name] [name] DOB: 19-Jan-1931   PCP: Geofm Glade [name], MD  Patient is from: ILF.  Uses rollator for mobility.  DOA: 11/23/2024 LOS: 2  Chief complaints Chief Complaint  Patient presents with   Altered Mental Status     Brief Narrative / Interim history: 89 year old F with PMH of DM-2, COPD, chronic hypoxic RF on 3 L at night, HLD, HTN, CAD, A-fib on Eliquis , PAH, OSA, CKD-3A, PAD, and CHFpEF presented to ED with altered mental status, and admitted with DKA, AKI and UTI.  Patient was placed on prednisone  for COPD exacerbation on 11/15/2024.  In ED, she was confused.  CBG> 600.  Anion gap 20.  BHA 3.61.  Creatinine 1.52.  K5.9.  UA with ketonuria and also concerning for UTI.  Started on IV fluids, insulin  drip and antibiotics.   Patient's DKA and AKI resolved and she was transition to subcu insulin .  Urine culture with multiple species.  Continued on IV ceftriaxone .    Subjective: Seen and examined earlier this morning.  No major events overnight or this morning.  Reports some dry cough but denies chest pain or shortness of breath.  Felt dizzy and nauseous when she sat up in bed for exam.   Assessment and plan: NIDDM-2 with DKA and hyperglycemia: A1c 8.7%.  DKA resolved.  On Ozempic  at home. Recent Labs  Lab 11/24/24 1156 11/24/24 1657 11/24/24 2040 11/25/24 0718 11/25/24 1157  GLUCAP 286* 176* 344* 228* 194*  -Continue SSI-moderate -Add NovoLog  3 units 3 times daily with meals -Increase Semglee  from 7 to 15 units daily -Further adjustment as appropriate.   AKI on CKD-3A: b/l Cr ~0.8-1.0.  AKI likely due to DKA.  Also on torsemide  outpatient.  Resolved. Recent Labs    11/23/24 1303 11/23/24 1430 11/23/24 2104 11/24/24 0118 11/24/24 0908 11/24/24 1333 11/25/24 0627  BUN 50* 46* 43* 38* 29* 25* 21  CREATININE 1.52* 1.41* 1.48* 1.21* 1.05* 1.16* 1.06*  - Continue monitoring.  Chronic COPD/chronic hypoxic RF: Reports using 3 L at  night and with exertion.  She endorses some dry cough but no shortness of breath. - Continue bronchodilators - Incentive spirometry, OOB and ambulatory saturation.  Chronic HFmrEF: TTE in 2022 with LVEF of 40 to 45%, global hypokinesis and indeterminate DD.  Appears euvolemic on exam.  On torsemide  40 mg twice daily outpatient.  Followed by Dr. Bensimhon. - Continue torsemide  40 mg daily. -Continue home Coreg  - Strict intake and output, daily weight  History of CAD, hyperlipidemia, -Continue amlodipine , Coreg ,  Presumed UTI: UA concerning for UTI.  Urine culture with multiple species.  No history of MDR infection. -Continue IV ceftriaxone  empirically    Pseudohyponatremia, hyperkalemia secondary to DKA: Resolved.    Chronic atrial fibrillation: In sinus rhythm. -Continue Coreg , Eliquis  -Optimize electrolytes.   Essential hypertension: Normotensive - Continue amlodipine , Coreg  and torsemide  as above - Check orthostatic vitals  Body mass index is 24.95 kg/m.        Pressure skin injury: Present on admission. Wound 11/23/24 2030 Pressure Injury Sacrum Right;Medial Stage 1 -  Intact skin with non-blanchable redness of a localized area usually over a bony prominence. (Active)   DVT prophylaxis:   apixaban  (ELIQUIS ) tablet 5 mg  Code Status: Full code Family Communication: Updated patient's daughter over the phone Level of care: Telemetry Status is: Inpatient Remains inpatient appropriate because: DKA, hyperglycemia, UTI   Final disposition: ILF   55 minutes with more than 50% spent in reviewing records,  counseling patient/family and coordinating care.  Consultants:  None  Procedures: None  Microbiology summarized: Urine culture with multiple species.  Objective: Vitals:   11/24/24 2055 11/25/24 0600 11/25/24 0918 11/25/24 1329  BP:  (!) 127/58  (!) 129/59  Pulse:  74  68  Resp:  20  20  Temp:  97.7 F (36.5 C)  (!) 97.5 F (36.4 C)  TempSrc:  Oral  Oral   SpO2: 98% 97% 92% 94%  Weight:      Height:        Examination:  GENERAL: No apparent distress.  Nontoxic. HEENT: MMM.  Vision and hearing grossly intact.  NECK: Supple.  No apparent JVD.  RESP:  No IWOB.  Fair aeration bilaterally. CVS:  RRR. Heart sounds normal.  ABD/GI/GU: BS+. Abd soft, NTND.  MSK/EXT:  Moves extremities. No apparent deformity. No edema.  SKIN: no apparent skin lesion or wound NEURO: AA.  Oriented appropriately.  No apparent focal neuro deficit. PSYCH: Calm. Normal affect.   Sch Meds:  Scheduled Meds:  amLODipine   5 mg Oral Daily   apixaban   5 mg Oral BID   arformoterol   15 mcg Nebulization BID   And   umeclidinium bromide   1 puff Inhalation Daily   carvedilol   6.25 mg Oral BID WC   Chlorhexidine  Gluconate Cloth  6 each Topical Daily   cholecalciferol   1,000 Units Oral Daily   insulin  aspart  0-15 Units Subcutaneous TID WC   insulin  aspart  0-5 Units Subcutaneous QHS   insulin  aspart  3 Units Subcutaneous TID WC   insulin  glargine-yfgn  15 Units Subcutaneous Daily   melatonin  3 mg Oral QHS   torsemide   40 mg Oral Daily   Continuous Infusions:  cefTRIAXone  (ROCEPHIN )  IV Stopped (11/24/24 2309)   PRN Meds:.acetaminophen  **OR** acetaminophen , dextrose , guaiFENesin -dextromethorphan , ipratropium-albuterol , metoprolol  tartrate, nitroGLYCERIN , ondansetron  **OR** ondansetron  (ZOFRAN ) IV, mouth rinse, phenol, polyethylene glycol  Antimicrobials: Anti-infectives (From admission, onward)    Start     Dose/Rate Route Frequency Ordered Stop   11/23/24 2230  cefTRIAXone  (ROCEPHIN ) 1 g in sodium chloride  0.9 % 100 mL IVPB        1 g 200 mL/hr over 30 Minutes Intravenous Every 24 hours 11/23/24 2142 11/28/24 2229   11/23/24 1445  nitrofurantoin  (macrocrystal-monohydrate) (MACROBID ) capsule 100 mg        100 mg Oral  Once 11/23/24 1431 11/23/24 1605        I have personally reviewed the following labs and images: CBC: Recent Labs  Lab 11/23/24 1303  11/23/24 1324 11/23/24 1330 11/24/24 0908 11/25/24 0627  WBC 9.8  --   --  9.8 10.4  NEUTROABS 8.2*  --   --   --   --   HGB 15.9* 17.3* 17.0* 14.2 14.6  HCT 49.3* 51.0* 50.0* 43.8 46.3*  MCV 94.3  --   --  93.8 96.1  PLT 196  --   --  172 154   BMP &GFR Recent Labs  Lab 11/23/24 2104 11/24/24 0118 11/24/24 0908 11/24/24 1333 11/25/24 0627  NA 134* 134* 134* 133* 134*  K 3.9 4.2 4.1 4.2 4.0  CL 89* 93* 95* 93* 94*  CO2 30 30 32 30 30  GLUCOSE 243* 169* 208* 191* 224*  BUN 43* 38* 29* 25* 21  CREATININE 1.48* 1.21* 1.05* 1.16* 1.06*  CALCIUM  9.9 9.3 9.7 9.9 9.5   Estimated Creatinine Clearance: 31.1 mL/min (A) (by C-G formula based on SCr of 1.06 mg/dL (H)). Liver &  Pancreas: Recent Labs  Lab 11/23/24 1303  AST 40  ALT 23  ALKPHOS 113  BILITOT 0.9  PROT 8.1  ALBUMIN  4.0   No results for input(s): LIPASE, AMYLASE in the last 168 hours. No results for input(s): AMMONIA in the last 168 hours. Diabetic: No results for input(s): HGBA1C in the last 72 hours. Recent Labs  Lab 11/24/24 1156 11/24/24 1657 11/24/24 2040 11/25/24 0718 11/25/24 1157  GLUCAP 286* 176* 344* 228* 194*   Cardiac Enzymes: Recent Labs  Lab 11/23/24 1303  CKTOTAL 145   No results for input(s): PROBNP in the last 8760 hours. Coagulation Profile: No results for input(s): INR, PROTIME in the last 168 hours. Thyroid  Function Tests: No results for input(s): TSH, T4TOTAL, FREET4, T3FREE, THYROIDAB in the last 72 hours. Lipid Profile: No results for input(s): CHOL, HDL, LDLCALC, TRIG, CHOLHDL, LDLDIRECT in the last 72 hours. Anemia Panel: No results for input(s): VITAMINB12, FOLATE, FERRITIN, TIBC, IRON, RETICCTPCT in the last 72 hours. Urine analysis:    Component Value Date/Time   COLORURINE YELLOW 11/23/2024 1430   APPEARANCEUR CLEAR 11/23/2024 1430   LABSPEC 1.008 11/23/2024 1430   PHURINE 5.5 11/23/2024 1430   GLUCOSEU >1,000 (A)  11/23/2024 1430   GLUCOSEU NEGATIVE 10/11/2024 0954   HGBUR NEGATIVE 11/23/2024 1430   BILIRUBINUR NEGATIVE 11/23/2024 1430   BILIRUBINUR negative 08/26/2016 1137   KETONESUR TRACE (A) 11/23/2024 1430   PROTEINUR NEGATIVE 11/23/2024 1430   UROBILINOGEN 0.2 10/11/2024 0954   NITRITE NEGATIVE 11/23/2024 1430   LEUKOCYTESUR MODERATE (A) 11/23/2024 1430   Sepsis Labs: Invalid input(s): PROCALCITONIN, LACTICIDVEN  Microbiology: Recent Results (from the past 240 hours)  Urine Culture     Status: Abnormal   Collection Time: 11/23/24  2:31 PM   Specimen: Urine, Clean Catch  Result Value Ref Range Status   Specimen Description   Final    URINE, CLEAN CATCH Performed at [facility], [address] [place], [place] [address]    Special Requests   Final    NONE Performed at [facility], [address] [place], [place] [address]    Culture MULTIPLE SPECIES PRESENT, SUGGEST RECOLLECTION (A)  Final   Report Status 11/24/2024 FINAL  Final  MRSA Next Gen by PCR, Nasal     Status: None   Collection Time: 11/23/24  8:47 PM   Specimen: Nasal Mucosa; Nasal Swab  Result Value Ref Range Status   MRSA by PCR Next Gen NOT DETECTED NOT DETECTED Final    Comment: (NOTE) The GeneXpert MRSA Assay (FDA approved for NASAL specimens only), is one component of a comprehensive MRSA colonization surveillance program. It is not intended to diagnose MRSA infection nor to guide or monitor treatment for MRSA infections. Test performance is not FDA approved in patients less than 6 years old. Performed at [facility], [address], [place], [place] [address]     Radiology Studies: No results found.    Kahealani Yankovich T. Tiaria Biby [facility]  If 7PM-7AM, please contact night-coverage [email or web address] 11/25/2024, 2:25 PM

## 2024-11-26 ENCOUNTER — Telehealth: Payer: Self-pay | Admitting: Internal Medicine

## 2024-11-26 ENCOUNTER — Ambulatory Visit: Admitting: Internal Medicine

## 2024-11-26 DIAGNOSIS — N179 Acute kidney failure, unspecified: Secondary | ICD-10-CM | POA: Diagnosis not present

## 2024-11-26 DIAGNOSIS — E1110 Type 2 diabetes mellitus with ketoacidosis without coma: Secondary | ICD-10-CM | POA: Diagnosis not present

## 2024-11-26 DIAGNOSIS — I4811 Longstanding persistent atrial fibrillation: Secondary | ICD-10-CM | POA: Diagnosis not present

## 2024-11-26 DIAGNOSIS — I5032 Chronic diastolic (congestive) heart failure: Secondary | ICD-10-CM | POA: Diagnosis not present

## 2024-11-26 DIAGNOSIS — I10 Essential (primary) hypertension: Secondary | ICD-10-CM

## 2024-11-26 LAB — RENAL FUNCTION PANEL
Albumin: 3.1 g/dL — ABNORMAL LOW (ref 3.5–5.0)
Anion gap: 11 (ref 5–15)
BUN: 19 mg/dL (ref 8–23)
CO2: 30 mmol/L (ref 22–32)
Calcium: 9.3 mg/dL (ref 8.9–10.3)
Chloride: 97 mmol/L — ABNORMAL LOW (ref 98–111)
Creatinine, Ser: 1.12 mg/dL — ABNORMAL HIGH (ref 0.44–1.00)
GFR, Estimated: 46 mL/min — ABNORMAL LOW (ref >60)
Glucose, Bld: 164 mg/dL — ABNORMAL HIGH (ref 70–99)
Phosphorus: 3.4 mg/dL (ref 2.5–4.6)
Potassium: 3.5 mmol/L (ref 3.5–5.1)
Sodium: 138 mmol/L (ref 135–145)

## 2024-11-26 LAB — CBC
HCT: 43.1 % (ref 36.0–46.0)
Hemoglobin: 13.9 g/dL (ref 12.0–15.0)
MCH: 30.5 pg (ref 26.0–34.0)
MCHC: 32.3 g/dL (ref 30.0–36.0)
MCV: 94.5 fL (ref 80.0–100.0)
Platelets: 142 K/uL — ABNORMAL LOW (ref 150–400)
RBC: 4.56 MIL/uL (ref 3.87–5.11)
RDW: 12.2 % (ref 11.5–15.5)
WBC: 8.6 K/uL (ref 4.0–10.5)
nRBC: 0 % (ref 0.0–0.2)

## 2024-11-26 LAB — MAGNESIUM: Magnesium: 1.9 mg/dL (ref 1.7–2.4)

## 2024-11-26 LAB — GLUCOSE, CAPILLARY
Glucose-Capillary: 306 mg/dL — ABNORMAL HIGH (ref 70–99)
Glucose-Capillary: 303 mg/dL — ABNORMAL HIGH (ref 70–99)
Glucose-Capillary: 181 mg/dL — ABNORMAL HIGH (ref 70–99)

## 2024-11-26 LAB — HEMOGLOBIN A1C

## 2024-11-26 MED ORDER — NOVOLOG FLEXPEN 100 UNIT/ML ~~LOC~~ SOPN: 0.0000 [IU] | PEN_INJECTOR | Freq: Three times a day (TID) | SUBCUTANEOUS | 0 refills | Status: AC

## 2024-11-26 MED ORDER — INSULIN GLARGINE 100 UNIT/ML SOLOSTAR PEN: 20.0000 [IU] | PEN_INJECTOR | Freq: Every day | SUBCUTANEOUS | 11 refills | Status: AC

## 2024-11-26 NOTE — Progress Notes (Signed)
 Mobility Specialist Progress Note:  Trainer 3 L Pre-mobility: 91% SpO2 During mobility: 87-90% SpO2 Post-mobility: 94% SPO2   11/26/24 1012  Mobility  Activity Ambulated with assistance  Level of Assistance Contact guard assist, steadying assist  Assistive Device Front wheel walker  Distance Ambulated (ft) 80 ft  Activity Response Tolerated well  Mobility Referral Yes  Mobility visit 1 Mobility  Mobility Specialist Start Time (ACUTE ONLY) 0940  Mobility Specialist Stop Time (ACUTE ONLY) 0959  Mobility Specialist Time Calculation (min) (ACUTE ONLY) 19 min   Pt was received in bed and agreed to mobility. 2x brief standing breaks during ambulation due to fatigue, recovered quickly. Returned to bed with all needs met, call bell in reach.  [employer] - Mobility Specialist

## 2024-11-26 NOTE — Progress Notes (Signed)
   11/26/24 1412  TOC Brief Assessment  Insurance and Status Reviewed  Patient has primary care physician Yes [place], Glade PARAS, MD)  Home environment has been reviewed Independent living [place] has private caregiver  Prior level of function: Independent  Prior/Current Home Services Current home services (Has private caregiver)  Social Drivers of Health Review SDOH reviewed no interventions necessary  Readmission risk has been reviewed Yes  Transition of care needs transition of care needs identified, TOC will continue to follow

## 2024-11-26 NOTE — Plan of Care (Signed)
  Problem: Education: Goal: Knowledge of General Education information will improve Description: Including pain rating scale, medication(s)/side effects and non-pharmacologic comfort measures Outcome: Progressing   Problem: Health Behavior/Discharge Planning: Goal: Ability to manage health-related needs will improve Outcome: Progressing   Problem: Clinical Measurements: Goal: Ability to maintain clinical measurements within normal limits will improve Outcome: Progressing   Problem: Activity: Goal: Risk for activity intolerance will decrease Outcome: Progressing   Problem: Metabolic: Goal: Ability to maintain appropriate glucose levels will improve Outcome: Progressing   Problem: Nutritional: Goal: Maintenance of adequate nutrition will improve Outcome: Progressing

## 2024-11-26 NOTE — TOC Transition Note (Signed)
 Transition of Care [facility]) - Discharge Note   Patient Details  Name: [name] MRN: [id number] Date of Birth: [birth year]  [facility]) CM/SW Contact:  Toy LITTIE Agar, RN Phone [phone]  11/26/2024, 2:18 PM   Clinical Narrative:    Patient discharging back to independent living at [facility]. Patient has private caregiver. Home health to be provided by [facility]. CM spoke with Angeline with [facility] and referral orders have been faxed [phone] with confirmation received. No other CM needs noted at this time.      Barriers to Discharge: Continued Medical Work up   Patient Goals and CMS Choice Patient states their goals for this hospitalization and ongoing recovery are:: home     [place] ownership interest in [facility].provided to:: Patient    Discharge Placement                       Discharge Plan and Services Additional resources added to the After Visit Summary for     Discharge Planning Services: CM Consult            DME Arranged: N/A DME Agency: NA       HH Arranged: NA HH Agency: NA        Social Drivers of Health (SDOH) Interventions SDOH Screenings   Food Insecurity: No Food Insecurity (11/25/2024)  Housing: Low Risk  (11/25/2024)  Transportation Needs: No Transportation Needs (11/25/2024)  Utilities: Not At Risk (11/25/2024)  Alcohol Screen: Low Risk  (09/11/2024)  Depression (PHQ2-9): Low Risk  (10/10/2024)  Financial Resource Strain: Low Risk  (09/11/2024)  Physical Activity: Sufficiently Active (09/11/2024)  Social Connections: Moderately Integrated (11/23/2024)  Stress: No Stress Concern Present (09/11/2024)  Tobacco Use: Medium Risk (11/25/2024)  Health Literacy: Adequate Health Literacy (09/11/2024)     Readmission Risk Interventions    11/26/2024    1:51 PM 10/10/2023    3:10 PM  Readmission Risk Prevention Plan  Post Dischage Appt  Complete  Medication Screening  Complete   Transportation Screening Complete Complete

## 2024-11-26 NOTE — Plan of Care (Addendum)
 Patient's AVS and discharge information went over with Danna Cullins (Caregiver) and patient at the bedside. IV removed, intact, and awaiting transportation from daughter in law-Susan back to independent living facility at [facility]. No further needs atm.  Problem: Education: Goal: Knowledge of General Education information will improve Description: Including pain rating scale, medication(s)/side effects and non-pharmacologic comfort measures Outcome: Progressing   Problem: Health Behavior/Discharge Planning: Goal: Ability to manage health-related needs will improve Outcome: Progressing   Problem: Clinical Measurements: Goal: Ability to maintain clinical measurements within normal limits will improve Outcome: Progressing Goal: Will remain free from infection Outcome: Progressing Goal: Diagnostic test results will improve Outcome: Progressing Goal: Respiratory complications will improve Outcome: Progressing Goal: Cardiovascular complication will be avoided Outcome: Progressing   Problem: Activity: Goal: Risk for activity intolerance will decrease Outcome: Progressing   Problem: Nutrition: Goal: Adequate nutrition will be maintained Outcome: Progressing   Problem: Coping: Goal: Level of anxiety will decrease Outcome: Progressing   Problem: Elimination: Goal: Will not experience complications related to bowel motility Outcome: Progressing Goal: Will not experience complications related to urinary retention Outcome: Progressing   Problem: Pain Managment: Goal: General experience of comfort will improve and/or be controlled Outcome: Progressing   Problem: Safety: Goal: Ability to remain free from injury will improve Outcome: Progressing   Problem: Skin Integrity: Goal: Risk for impaired skin integrity will decrease Outcome: Progressing   Problem: Education: Goal: Ability to describe self-care measures that may prevent or decrease complications (Diabetes  Survival Skills Education) will improve Outcome: Progressing Goal: Individualized Educational Video(s) Outcome: Progressing   Problem: Coping: Goal: Ability to adjust to condition or change in health will improve Outcome: Progressing   Problem: Fluid Volume: Goal: Ability to maintain a balanced intake and output will improve Outcome: Progressing   Problem: Health Behavior/Discharge Planning: Goal: Ability to identify and utilize available resources and services will improve Outcome: Progressing Goal: Ability to manage health-related needs will improve Outcome: Progressing   Problem: Metabolic: Goal: Ability to maintain appropriate glucose levels will improve Outcome: Progressing   Problem: Nutritional: Goal: Maintenance of adequate nutrition will improve Outcome: Progressing Goal: Progress toward achieving an optimal weight will improve Outcome: Progressing   Problem: Skin Integrity: Goal: Risk for impaired skin integrity will decrease Outcome: Progressing   Problem: Tissue Perfusion: Goal: Adequacy of tissue perfusion will improve Outcome: Progressing   Problem: Education: Goal: Ability to describe self-care measures that may prevent or decrease complications (Diabetes Survival Skills Education) will improve Outcome: Progressing Goal: Individualized Educational Video(s) Outcome: Progressing   Problem: Cardiac: Goal: Ability to maintain an adequate cardiac output will improve Outcome: Progressing   Problem: Health Behavior/Discharge Planning: Goal: Ability to identify and utilize available resources and services will improve Outcome: Progressing Goal: Ability to manage health-related needs will improve Outcome: Progressing   Problem: Fluid Volume: Goal: Ability to achieve a balanced intake and output will improve Outcome: Progressing   Problem: Metabolic: Goal: Ability to maintain appropriate glucose levels will improve Outcome: Progressing   Problem:  Nutritional: Goal: Maintenance of adequate nutrition will improve Outcome: Progressing Goal: Maintenance of adequate weight for body size and type will improve Outcome: Progressing   Problem: Respiratory: Goal: Will regain and/or maintain adequate ventilation Outcome: Progressing   Problem: Urinary Elimination: Goal: Ability to achieve and maintain adequate renal perfusion and functioning will improve Outcome: Progressing

## 2024-11-26 NOTE — Inpatient Diabetes Management (Signed)
 Inpatient Diabetes Program Recommendations  AACE/ADA: New Consensus Statement on Inpatient Glycemic Control (2015)  Target Ranges:  Prepandial:   less than 140 mg/dL      Peak postprandial:   less than 180 mg/dL (1-2 hours)      Critically ill patients:  140 - 180 mg/dL    Latest Reference Range & Units 11/25/24 07:18 11/25/24 11:57 11/25/24 16:52 11/25/24 22:25  Glucose-Capillary 70 - 99 mg/dL 771 (H)  8 units Novolog   194 (H)  8 units Novolog  @1440   15 units Semglee  @1009   174 (H)  6 units Novolog   112 (H)  (H): Data is abnormally high  Latest Reference Range & Units 11/26/24 07:44  Glucose-Capillary 70 - 99 mg/dL 693 (H)  (H): Data is abnormally high    Home DM Meds: Novolog  4-6 units TID      Ozempic  0.5 mg qwk   Current Orders: Semglee  15 units daily     Novolog  Moderate Correction Scale/ SSI (0-15 units) TID AC + HS     Novolog  3 units TID with meals    MD- Note Semglee  15 units added yest AM CBG 306 this AM  May consider increasing the Semglee  to 20 units daily     --Will follow patient during hospitalization--  Adina Rudolpho Arrow RN, MSN, CDCES Diabetes Coordinator Inpatient Glycemic Control Team Team Pager: [phone] (8a-5p)

## 2024-11-26 NOTE — Discharge Summary (Signed)
 Physician Discharge Summary  [name] [name] DOB: 21-Feb-1931 DOA: 11/23/2024  PCP: [name] Glade [name], MD  Admit date: 11/23/2024 Discharge date: 11/26/24  Admitted From: ILF Disposition:  ILF Recommendations for Outpatient Follow-up:  Outpatient follow-up with PCP as below Check glycemic control, CMP and CBC at follow up Please follow up on the following pending results: None  Home Health: HHPT/OT/ST Equipment/Devices: Patient has appropriate DME  Discharge Condition: Stable CODE STATUS: Full code Diet Orders (From admission, onward)     Start     Ordered   11/24/24 0443  Diet Carb Modified Fluid consistency: Thin; Room service appropriate? Yes  Diet effective now       Question Answer Comment  Diet-HS Snack? Nothing   Calorie Level Medium 1600-2000   Fluid consistency: Thin   Room service appropriate? Yes      11/24/24 0442             Follow-up Information     [name] Glade [name], MD. Schedule an appointment as soon as possible for a visit in 1 week(s).   Specialty: Internal Medicine Contact information: [address] [place] [address] [phone]                 Hospital course 89 year old F with PMH of DM-2, COPD, chronic hypoxic RF on 3 L at night, HLD, HTN, CAD, A-fib on Eliquis , PAH, OSA, CKD-3A, PAD, and CHFpEF presented to ED with altered mental status, and admitted with DKA, AKI and UTI.  Patient was placed on prednisone  for COPD exacerbation on 11/15/2024.   In ED, she was confused.  CBG> 600.  Anion gap 20.  BHA 3.61.  Creatinine 1.52.  K5.9.  UA with ketonuria and also concerning for UTI.  Started on IV fluids, insulin  drip and antibiotics.    Patient's DKA and AKI resolved and she was transition to subcu insulin .  Urine culture with multiple species.  Completed 3 days of IV ceftriaxone  in house.  Patient is discharged on increased dose of insulin  for diabetes.  Recommend reassessing glycemic control and adjusting dosage  as appropriate at follow-up.  Outpatient follow-up with PCP as above.  See individual problem list below for more.   Problems addressed during this hospitalization NIDDM-2 with DKA and hyperglycemia: A1c 8.7%.  DKA resolved.  On Ozempic  at home. - Discharge Semglee  15 units daily and moderate scale NovoLog  ranging from 0 to 15 units based on CBG - Continue home Ozempic . - Outpatient follow-up with PCP     AKI on CKD-3A: b/l Cr ~0.8-1.0.  AKI likely due to DKA.  Also on torsemide  outpatient.  Resolved. - Recheck CMP at follow-up   Chronic COPD/chronic hypoxic RF: Reports using 3 L at night and with exertion.  She endorses some dry cough but no shortness of breath.  Required 3 L with ambulation. - Continue home inhalers and home oxygen .   Chronic HFmrEF: TTE in 2022 with LVEF of 40 to 45%, global hypokinesis and indeterminate DD.  Appears euvolemic on exam.  On torsemide  40 mg twice daily outpatient.  Followed by [name]. - Continue home torsemide , Coreg  and potassium. -Outpatient follow-up with cardiology as previously planned   History of CAD, hyperlipidemia, -Continue amlodipine , Coreg ,   Presumed UTI: UA concerning for UTI.  Urine culture with multiple species.  No history of MDR infection. - Completed 3 days of IV ceftriaxone  in house.    Pseudohyponatremia, hyperkalemia secondary to DKA: Resolved.   Chronic atrial fibrillation: In sinus  rhythm. -Continue Coreg , Eliquis    Essential hypertension: Normotensive - Continue amlodipine , Coreg  and torsemide  as above  Orthostatic hypotension: Orthostatic vitals positive on 11/30. - TED hose and a stepwise approach when she is up  Medication management - Discontinued medications that patient reported not taking at home.  Body mass index is 24.95 kg/m.       Stage I sacral pressure injury: Present on admission. Wound 11/23/24 2030 Pressure Injury Sacrum Right;Medial Stage 1 -  Intact skin with non-blanchable redness of a  localized area usually over a bony prominence. (Active)    Consultations: None  Time spent 35  minutes  Vital signs Vitals:   11/25/24 1941 11/25/24 2000 11/26/24 0550 11/26/24 0732  BP:  (!) 122/58 (!) 139/47   Pulse:  68 61   Temp:  98.8 F (37.1 C) 98.3 F (36.8 C)   Resp:   16   Height:      Weight:      SpO2: 95% 94% 94% 93%  TempSrc:  Oral Oral   BMI (Calculated):         Discharge exam  GENERAL: No apparent distress.  Nontoxic. HEENT: MMM.  Vision and hearing grossly intact.  NECK: Supple.  No apparent JVD.  RESP:  No IWOB.  Fair aeration bilaterally. CVS:  RRR. Heart sounds normal.  ABD/GI/GU: BS+. Abd soft, NTND.  MSK/EXT:  Moves extremities. No apparent deformity. No edema.  SKIN: no apparent skin lesion or wound NEURO: Awake and alert. Oriented appropriately.  No apparent focal neuro deficit. PSYCH: Calm. Normal affect.   Discharge Instructions Discharge Instructions     Increase activity slowly   Complete by: As directed    No wound care   Complete by: As directed       Allergies as of 11/26/2024       Reactions   Levaquin  [levofloxacin ] Shortness Of Breath, Swelling   angioedema   Lobster [shellfish Allergy] Other (See Comments)   angioedema   Penicillins Rash   Has patient had a PCN reaction causing immediate rash, facial/tongue/throat swelling, SOB or lightheadedness with hypotension: Yes Has patient had a PCN reaction causing severe rash involving mucus membranes or skin necrosis: No Has patient had a PCN reaction that required hospitalization: No Has patient had a PCN reaction occurring within the last 10 years: No If all of the above answers are NO, then may proceed with Cephalosporin use.   Valsartan Other (See Comments)   REACTION: angioedema   Codeine Other (See Comments)   Mental status changes   Lipitor [atorvastatin ] Other (See Comments)   weakness   Oxycodone  Other (See Comments)   hallucinations   Statins    Made her too  weak   Tramadol    Amlodipine  Besylate Other (See Comments)   REACTION: tingling in lips & gum edema 7/12: talked to patient, states she is tolerating well   Crestor  [rosuvastatin  Calcium ] Rash   Tramadol Hcl Nausea And Vomiting        Medication List     STOP taking these medications    pregabalin  50 MG capsule Commonly known as: Lyrica        TAKE these medications    albuterol  108 (90 Base) MCG/ACT inhaler Commonly known as: VENTOLIN  HFA Inhale 2 puffs into the lungs every 6 (six) hours as needed for wheezing or shortness of breath.   amLODipine  5 MG tablet Commonly known as: NORVASC  TAKE 1 TABLET BY MOUTH DAILY   carvedilol  6.25 MG tablet Commonly known  as: COREG  Take 1 tablet (6.25 mg total) by mouth 2 (two) times daily with a meal.   CENTRUM SILVER ULTRA WOMENS PO Take 1 tablet by mouth daily.   Eliquis  5 MG Tabs tablet Generic drug: apixaban  Take 1 tablet (5 mg total) by mouth 2 (two) times daily.   fluticasone  50 MCG/ACT nasal spray Commonly known as: FLONASE  Place 1 spray into both nostrils daily as needed for allergies or rhinitis.   FreeStyle Libre 3 Plus Sensor Misc Inject 1 Device into the skin for continuous glucose monitoring. Change every 15 days   FreeStyle Libre 3 Reader [place] Use to check glucose continuously.   insulin  glargine 100 UNIT/ML Solostar Pen Commonly known as: LANTUS  Inject 20 Units into the skin daily.   Insupen Pen Needles 32G X 4 MM Misc Generic drug: Insulin  Pen Needle Use as directed 3 (three) times daily.   ipratropium 0.06 % nasal spray Commonly known as: ATROVENT  Place 2 sprays into both nostrils 3 (three) times daily.   ipratropium-albuterol  0.5-2.5 (3) MG/3ML Soln Commonly known as: DUONEB Take 3 mLs by nebulization every 6 (six) hours as needed. What changed: reasons to take this   Lancing Device Misc Use as advised - for Delica Lancets   nitroGLYCERIN  0.4 MG SL tablet Commonly known as: NITROSTAT  Place  1 tablet (0.4 mg total) under the tongue every 5 (five) minutes as needed for chest pain.   NovoLOG  FlexPen 100 UNIT/ML FlexPen Generic drug: insulin  aspart Inject 0-15 Units into the skin 3 (three) times daily before meals. CBG 70 - 120: 0 units CBG 121 - 150: 2 units CBG 151 - 200: 3 units CBG 201 - 250: 5 units CBG 251 - 300: 8 units CBG 301 - 350: 11 units CBG 351 - 400: 15 units CBG > 400: call MD and obtain STAT lab verification What changed:  how much to take additional instructions   OneTouch Delica Plus Lancet33G Misc USE TO MONITOR GLUCOSE  LEVELS TWICE DAILY   OneTouch Verio test strip Generic drug: glucose blood CHECK BLOOD SUGAR TWICE DAILY   OneTouch Verio w/Device Kit Use as instructed to check blood sugar   OXYGEN  Inhale 3 Doses into the lungs as needed. 3 liters   Ozempic  (0.25 or 0.5 MG/DOSE) 2 MG/3ML Sopn Generic drug: Semaglutide (0.25 or 0.5MG /DOS) Inject 0.5 mg into the skin once a week.   potassium chloride  SA 20 MEQ tablet Commonly known as: KLOR-CON  M Take 2 tablets (40 mEq total) by mouth daily.   Stiolto Respimat  2.5-2.5 MCG/ACT Aers Generic drug: Tiotropium Bromide -Olodaterol Inhale 2 puffs into the lungs daily.   torsemide  20 MG tablet Commonly known as: DEMADEX  Take 2 tablets (40 mg total) by mouth 2 (two) times daily.   traZODone  50 MG tablet Commonly known as: DESYREL  Take 0.5-1 tablets (25-50 mg total) by mouth at bedtime as needed for sleep.   Vitamin D  (Cholecalciferol ) 25 MCG (1000 UT) Caps Take 1 capsule by mouth daily.         Procedures/Studies:   CT Cervical Spine Wo Contrast Result Date: 11/23/2024 EXAM: CT CERVICAL SPINE WITHOUT CONTRAST 11/23/2024 01:51:04 PM TECHNIQUE: CT of the cervical spine was performed without the administration of intravenous contrast. Multiplanar reformatted images are provided for review. Automated exposure control, iterative reconstruction, and/or weight based adjustment of the mA/kV was  utilized to reduce the radiation dose to as low as reasonably achievable. COMPARISON: None available. CLINICAL HISTORY: Fall. FINDINGS: CERVICAL SPINE: BONES AND ALIGNMENT: Straightening of the normal cervical  lordosis. Trace degenerative anterolisthesis of C4 on C5. Partial fusion of the C5 and C6 vertebral bodies. Diffuse osteopenia. Asymmetric spacing of the dens and lateral masses of C1 likely related to asymmetric degenerative changes at the atlantoaxial articulations. No acute fracture or traumatic malalignment. DEGENERATIVE CHANGES: Disc osteophyte complexes at multiple levels. Facet arthrosis. No vertebral hypertrophy throughout the cervical spine. There is no high grade osseous spinal canal stenosis. There is significant foraminal stenosis at multiple levels particularly on the right at C3-C4 and on the left at C4-C5. SOFT TISSUES: No prevertebral soft tissue swelling. LUNGS: Centrilobular emphysema. IMPRESSION: 1. No acute abnormality of the cervical spine. 2. Degenerative changes as above. 3. Asymmetric spacing of the dens and lateral masses of C1, likely related to asymmetric degenerative changes at the atlantoaxial articulations. 4. Emphysema. Electronically signed by: Donnice Mania MD 11/23/2024 02:40 PM EST RP Workstation: [id number]   CT Head Wo Contrast Result Date: 11/23/2024 EXAM: CT HEAD WITHOUT 11/23/2024 01:49:31 PM TECHNIQUE: CT of the head was performed without the administration of intravenous contrast. Automated exposure control, iterative reconstruction, and/or weight based adjustment of the mA/kV was utilized to reduce the radiation dose to as low as reasonably achievable. COMPARISON: 05/24/2017 CLINICAL HISTORY: Head trauma, minor (Age >= 65y); Fall on thinners, AMS FINDINGS: BRAIN AND VENTRICLES: No acute intracranial hemorrhage. No mass effect or midline shift. No extra-axial fluid collection. No evidence of acute infarct. No hydrocephalus. Moderate chronic microvascular ischemic  change with generalized volume loss. Bilateral basal ganglia mineralization. 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 ORBITS: Bilateral lens replacement. SINUSES AND MASTOIDS: No acute abnormality. SOFT TISSUES AND SKULL: No acute skull fracture. No acute soft tissue abnormality. IMPRESSION: 1. No acute intracranial abnormality. 2. 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 3. Moderate chronic microvascular ischemic change with generalized volume loss. Electronically signed by: Donnice Mania MD 11/23/2024 02:15 PM EST RP Workstation: [id number]   DG Chest 2 View Result Date: 11/23/2024 CLINICAL DATA:  Confusion and cough EXAM: CHEST - 2 VIEW COMPARISON:  Chest radiograph dated 01/03/2024 FINDINGS: Unchanged elevation of the right hemidiaphragm. Normal lung volumes. No focal consolidations. No pleural effusion or pneumothorax. Enlarged cardiomediastinal silhouette is likely projectional. No acute osseous abnormality. IMPRESSION: No active cardiopulmonary disease. Electronically Signed   By: Limin  Xu M.D.   On: 11/23/2024 13:56       The results of significant diagnostics from this hospitalization (including imaging, microbiology, ancillary and laboratory) are listed below for reference.     Microbiology: Recent Results (from the past 240 hours)  Urine Culture     Status: Abnormal   Collection Time: 11/23/24  2:31 PM   Specimen: Urine, Clean Catch  Result Value Ref Range Status   Specimen Description   Final    URINE, CLEAN CATCH Performed at [facility], [address] [place], [place] [address]    Special Requests   Final    NONE Performed at [facility], [address], [place], [place] [address]    Culture MULTIPLE SPECIES PRESENT, SUGGEST RECOLLECTION (A)  Final   Report Status 11/24/2024 FINAL  Final  MRSA Next Gen by PCR, Nasal     Status:  None   Collection Time: 11/23/24  8:47 PM   Specimen: Nasal Mucosa; Nasal Swab  Result Value Ref Range Status   MRSA by PCR Next Gen NOT DETECTED NOT DETECTED Final    Comment: (NOTE) The  GeneXpert MRSA Assay (FDA approved for NASAL specimens only), is one component of a comprehensive MRSA colonization surveillance program. It is not intended to diagnose MRSA infection nor to guide or monitor treatment for MRSA infections. Test performance is not FDA approved in patients less than 84 years old. Performed at [facility], [address] [place], [place] [address]      Labs:  CBC: Recent Labs  Lab 11/23/24 1303 11/23/24 1324 11/23/24 1330 11/24/24 0908 11/25/24 0627 11/26/24 0452  WBC 9.8  --   --  9.8 10.4 8.6  NEUTROABS 8.2*  --   --   --   --   --   HGB 15.9* 17.3* 17.0* 14.2 14.6 13.9  HCT 49.3* 51.0* 50.0* 43.8 46.3* 43.1  MCV 94.3  --   --  93.8 96.1 94.5  PLT 196  --   --  172 154 142*   BMP &GFR Recent Labs  Lab 11/24/24 0118 11/24/24 0908 11/24/24 1333 11/25/24 0627 11/26/24 0452  NA 134* 134* 133* 134* 138  K 4.2 4.1 4.2 4.0 3.5  CL 93* 95* 93* 94* 97*  CO2 30 32 30 30 30   GLUCOSE 169* 208* 191* 224* 164*  BUN 38* 29* 25* 21 19  CREATININE 1.21* 1.05* 1.16* 1.06* 1.12*  CALCIUM  9.3 9.7 9.9 9.5 9.3  MG  --   --   --   --  1.9  PHOS  --   --   --   --  3.4   Estimated Creatinine Clearance: 29.4 mL/min (A) (by C-G formula based on SCr of 1.12 mg/dL (H)). Liver & Pancreas: Recent Labs  Lab 11/23/24 1303 11/26/24 0452  AST 40  --   ALT 23  --   ALKPHOS 113  --   BILITOT 0.9  --   PROT 8.1  --   ALBUMIN  4.0 3.1*   No results for input(s): LIPASE, AMYLASE in the last 168 hours. No results for input(s): AMMONIA in the last 168 hours. Diabetic: Recent Labs    11/24/24 0645  HGBA1C QNSTST   Recent Labs  Lab 11/25/24 1652 11/25/24 2225 11/26/24 0744 11/26/24 1132 11/26/24 1312  GLUCAP 174* 112* 306* 303* 181*    Cardiac Enzymes: Recent Labs  Lab 11/23/24 1303  CKTOTAL 145   No results for input(s): PROBNP in the last 8760 hours. Coagulation Profile: No results for input(s): INR, PROTIME in the last 168 hours. Thyroid  Function Tests: No results for input(s): TSH, T4TOTAL, FREET4, T3FREE, THYROIDAB in the last 72 hours. Lipid Profile: No results for input(s): CHOL, HDL, LDLCALC, TRIG, CHOLHDL, LDLDIRECT in the last 72 hours. Anemia Panel: No results for input(s): VITAMINB12, FOLATE, FERRITIN, TIBC, IRON, RETICCTPCT in the last 72 hours. Urine analysis:    Component Value Date/Time   COLORURINE YELLOW 11/23/2024 1430   APPEARANCEUR CLEAR 11/23/2024 1430   LABSPEC 1.008 11/23/2024 1430   PHURINE 5.5 11/23/2024 1430   GLUCOSEU >1,000 (A) 11/23/2024 1430   GLUCOSEU NEGATIVE 10/11/2024 0954   HGBUR NEGATIVE 11/23/2024 1430   BILIRUBINUR NEGATIVE 11/23/2024 1430   BILIRUBINUR negative 08/26/2016 1137   KETONESUR TRACE (A) 11/23/2024 1430   PROTEINUR NEGATIVE 11/23/2024 1430   UROBILINOGEN 0.2 10/11/2024 0954   NITRITE NEGATIVE 11/23/2024 1430   LEUKOCYTESUR MODERATE (A) 11/23/2024 1430   Sepsis Labs: Invalid input(s): PROCALCITONIN, LACTICIDVEN   SIGNED:  Myiesha Edgar T Raeann Offner, MD  [facility] 11/26/2024, 5:19 PM

## 2024-11-26 NOTE — Telephone Encounter (Signed)
 Copied from CRM #[id number]. Topic: General - Call Back - No Documentation >> Nov 26, 2024  3:29 PM Willma R wrote:  Reason for CRM: Earnie from [facility] returning call to [facility] in regards to orders that were faxed over.  Earnie can be reached [phone]

## 2024-11-26 NOTE — Telephone Encounter (Addendum)
 I figured that might be the case, the CRM mentioned they were returning your call, so I wanted to make sure it reached you. Sorry for the inconvenience.

## 2024-11-27 NOTE — Telephone Encounter (Signed)
 noted

## 2024-11-28 ENCOUNTER — Telehealth: Payer: Self-pay

## 2024-11-28 DIAGNOSIS — Z794 Long term (current) use of insulin: Secondary | ICD-10-CM

## 2024-11-28 NOTE — Telephone Encounter (Signed)
 Patient is failing [employer] metric for A1C. Called patient and she is coming in to have her A1c drawn 12/4. 11/24/24 was unable to be processed.

## 2024-11-29 ENCOUNTER — Other Ambulatory Visit

## 2024-11-30 ENCOUNTER — Other Ambulatory Visit (HOSPITAL_BASED_OUTPATIENT_CLINIC_OR_DEPARTMENT_OTHER): Payer: Self-pay

## 2024-11-30 ENCOUNTER — Other Ambulatory Visit (HOSPITAL_COMMUNITY): Payer: Self-pay

## 2024-11-30 NOTE — Addendum Note (Signed)
 Addended by: CLEATUS SUZEN SAILOR on: 11/30/2024 11:53 AM   Modules accepted: Orders

## 2024-11-30 NOTE — Telephone Encounter (Signed)
 Patient scheduled with provider 01/2025 for her 6 month f/u on A1C does not need appointment for lab in Dec.

## 2024-12-02 DIAGNOSIS — J449 Chronic obstructive pulmonary disease, unspecified: Secondary | ICD-10-CM | POA: Diagnosis not present

## 2024-12-03 ENCOUNTER — Other Ambulatory Visit (HOSPITAL_COMMUNITY): Payer: Self-pay

## 2024-12-04 ENCOUNTER — Other Ambulatory Visit

## 2024-12-05 ENCOUNTER — Other Ambulatory Visit (HOSPITAL_COMMUNITY): Payer: Self-pay

## 2024-12-08 ENCOUNTER — Other Ambulatory Visit (HOSPITAL_COMMUNITY): Payer: Self-pay

## 2024-12-18 ENCOUNTER — Other Ambulatory Visit (HOSPITAL_COMMUNITY): Payer: Self-pay

## 2024-12-19 ENCOUNTER — Other Ambulatory Visit (HOSPITAL_COMMUNITY): Payer: Self-pay

## 2024-12-19 ENCOUNTER — Ambulatory Visit: Payer: Self-pay

## 2024-12-19 NOTE — Telephone Encounter (Signed)
 FYI Only or Action Required?: FYI only for provider: Plans to go to UC.  Patient was last seen in primary care on 10/10/2024 by Geofm Glade PARAS, MD.  Called Nurse Triage reporting Dysphagia.  Symptoms began several weeks ago.  Interventions attempted: Other: cutting food very small.  Symptoms are: unchanged.  Triage Disposition: See HCP Within 4 Hours (Or PCP Triage) (overriding Call PCP Now)  Patient/caregiver understands and will follow disposition?: Yes      Copied from CRM [id number]. Topic: Clinical - Red Word Triage >> Dec 19, 2024 10:52 AM Thersia BROCKS wrote: [facility] that prompted transfer to Nurse Triage: Patient called in stated she has been have trouble with swallowing her food, getting choke on it, patient also stated she has been having breathing problems at well. Would like to see someone regarding this    ----------------------------------------------------------------------- From previous Reason for Contact - Scheduling: Patient/patient representative is calling to schedule an appointment. Refer to attachments for appointment information. Reason for Disposition  [1] Coughing spells AND [2] occur during eating/feedings or within 2 hours  Answer Assessment - Initial Assessment Questions This RN recommended pt be examined in next 4 hours, advised UC or ED, ED if any trouble swallowing liquids, advised follow up with PCP. Pt verbalized understanding and plans to arrange transportation to [facility] or ED.   Think maybe muscles in throat or something When I eat often it won't go down and can't get it back up Coughing and choking sometimes, not every time Happening with liquids and soft foods as well Use oxygen , no more trouble breathing than usual Choking Just trouble swallowing is new If take small bites likely not to happen Just started in past couple weeks Live in retirement facility, meals provided Go to dining room to eat Managers come to my rescue when choking It's  scary, get to point where gasping, can't breathe No swelling to tongue, fever But have episodes of sneezing 10x in a row No pain, no ulcers/sores Tremendous amount of mucus, blowing all day long, someone hear said perhaps it was oxygen  being in nose, still getting enough oxygen , pulse ox been okay Able to swallow saliva Able to swallow water Not ongoing feeling of something stuck, only when try to eat something  Protocols used: Swallowing Difficulty-A-AH

## 2024-12-21 ENCOUNTER — Other Ambulatory Visit (HOSPITAL_COMMUNITY): Payer: Self-pay

## 2024-12-21 ENCOUNTER — Other Ambulatory Visit (HOSPITAL_BASED_OUTPATIENT_CLINIC_OR_DEPARTMENT_OTHER): Payer: Self-pay

## 2024-12-21 NOTE — Telephone Encounter (Signed)
Please advise as MD is out of office

## 2024-12-30 NOTE — Progress Notes (Incomplete)
 "  @Patient  ID: [name], female    DOB: [birth year], 89 y.o.   MRN: [id number]  No chief complaint on file.   Referring provider: Geofm Glade [name], MD  HPI: 89 year old female, former smoker followed for emphysema and chronic respiratory failure on supplemental O2. She is a former patient of Dr. Magdaleno and last seen in office 01/03/2024 by Malachy NP. Past medical history significant for PH, CHF, CAD, A fib on eliquis , HTN, HLD, DM on insulin , allergies. She is intolerant of CPAP. She had AECOPD in 10/2024 and 09/2023 treated with CS and atb.   Discussed the use of AI scribe software for clinical note transcription with the patient, who gave verbal consent to proceed.  History of Present Illness [name] is a 89 year old female who presents with worsening breathing difficulties. Her daughter is with her.   She has experienced worsening breathing difficulties over the past month. No known sick exposure at the time of onset. No increased oxygen  requirements. Over the last 2-3 days, she's developed cold like symptoms with sinus congestion and a productive cough with some yellow phlegm. No fever, body aches, chills, hemoptysis, wheezing, or leg swelling. No CP, palpitations. Oxygen  levels at home have been stable. She denies frequent use of her rescue inhaler and confirms continued use of her Stiolto inhaler.  She mentions having allergies and nasal drainage, which she believes may be contributing to her symptoms. She does have flonase  nasal spray.     Allergies  Allergen Reactions   Levaquin  [Levofloxacin ] Shortness Of Breath and Swelling    angioedema   Lobster [Shellfish Allergy] Other (See Comments)    angioedema   Penicillins Rash    Has patient had a PCN reaction causing immediate rash, facial/tongue/throat swelling, SOB or lightheadedness with hypotension: Yes Has patient had a PCN reaction causing severe rash involving mucus membranes or skin necrosis: No Has patient  had a PCN reaction that required hospitalization: No Has patient had a PCN reaction occurring within the last 10 years: No If all of the above answers are NO, then may proceed with Cephalosporin use.   Valsartan Other (See Comments)    REACTION: angioedema   Codeine Other (See Comments)    Mental status changes   Lipitor [Atorvastatin ] Other (See Comments)    weakness   Oxycodone  Other (See Comments)    hallucinations   Statins     Made her too weak   Tramadol    Amlodipine  Besylate Other (See Comments)    REACTION: tingling in lips & gum edema 7/12: talked to patient, states she is tolerating well   Crestor  [Rosuvastatin  Calcium ] Rash   Tramadol Hcl Nausea And Vomiting    Immunization History  Administered Date(s) Administered   Fluad Quad(high Dose 65+) 08/22/2019, 10/07/2021, 10/01/2022, 09/21/2024   Fluad Trivalent(High Dose 65+) 10/11/2023   INFLUENZA, HIGH DOSE SEASONAL PF 10/14/2016, 10/10/2017, 09/28/2018   Influenza Split 11/16/2011, 09/26/2012   Influenza Whole 12/27/2004, 10/21/2008, 10/20/2009, 11/11/2010   Influenza,inj,Quad PF,6+ Mos 09/24/2013, 12/16/2014, 09/19/2015   Moderna Covid-19 Vaccine Bivalent Booster 51yrs & up 10/01/2022   Moderna SARS-COV2 Booster Vaccination 11/03/2020   Moderna Sars-Covid-2 Vaccination 02/08/2020, 03/10/2020   PPD Test 05/31/2015, 07/07/2016   Pfizer(Comirnaty)Fall Seasonal Vaccine 12 years and older 09/21/2024   Pneumococcal Conjugate-13 07/31/2018   Pneumococcal Polysaccharide-23 12/16/2014   Pneumococcal-Unspecified 05/31/2015   Td 06/15/2010    Past Medical History:  Diagnosis Date   Anemia    iron defficiency  Anginal pain    Arthritis    knees, feet, hands; joints (05/27/2015)   Asthma    Atrial fibrillation or flutter    s/p RFCA 7/08;   s/p DCCV in past;   previously on amiodarone ;  amio stopped due to lung toxicity   CAD (coronary artery disease)    s/p NSTEMI tx with BMS to OM1 3/08;  cath 3/08: pOM 99% tx  with PCI, pLAD 20%, ? mod stenosis at the AM   CAP (community acquired pneumonia) 05/24/2015   CHF (congestive heart failure) (HCC)    Chronic diastolic heart failure (HCC)    echo 11/11:  EF 55-60%, severe LVH, mod LAE, mild MR, mildly increased PASP   COPD (chronic obstructive pulmonary disease) (HCC)    Degenerative joint disease    Dystrophy, corneal stromal    Heart murmur    HLD (hyperlipidemia)    HTN (hypertension)    essential nos   Hypopotassemia    PMH of   Muscle pain    Myocardial infarction (HCC) 2008   OSA on CPAP    Osteoporosis    Pneumonia 05/27/2015   Protein calorie malnutrition    Rash and nonspecific skin eruption    both arms,awaiting bio   dr robinson   Seasonal allergies    Shortness of breath dyspnea    Type II diabetes mellitus (HCC)    Dr Kassie    Tobacco History: Social History   Tobacco Use  Smoking Status Former   Current packs/day: 0.00   Average packs/day: 0.3 packs/day for 18.0 years (4.5 ttl pk-yrs)   Types: Cigarettes   Start date: 12/27/1950   Quit date: 12/27/1968   Years since quitting: 56.0  Smokeless Tobacco Never  Tobacco Comments   smoked 1952- 1970, up to 1 pp week   Counseling given: Not Answered Tobacco comments: smoked 1952- 1970, up to 1 pp week   Outpatient Medications Prior to Visit  Medication Sig Dispense Refill   albuterol  (VENTOLIN  HFA) 108 (90 Base) MCG/ACT inhaler Inhale 2 puffs into the lungs every 6 (six) hours as needed for wheezing or shortness of breath. 6.7 g 2   amLODipine  (NORVASC ) 5 MG tablet TAKE 1 TABLET BY MOUTH DAILY 90 tablet 3   apixaban  (ELIQUIS ) 5 MG TABS tablet Take 1 tablet (5 mg total) by mouth 2 (two) times daily. 180 tablet 3   Blood Glucose Monitoring Suppl (ONETOUCH VERIO) w/Device KIT Use as instructed to check blood sugar 1 kit 0   carvedilol  (COREG ) 6.25 MG tablet Take 1 tablet (6.25 mg total) by mouth 2 (two) times daily with a meal. 180 tablet 3   Continuous Glucose Receiver (FREESTYLE  LIBRE 3 READER) DEVI Use to check glucose continuously. 1 each 0   Continuous Glucose Sensor (FREESTYLE LIBRE 3 PLUS SENSOR) MISC Inject 1 Device into the skin for continuous glucose monitoring. Change every 15 days 6 each 3   fluticasone  (FLONASE ) 50 MCG/ACT nasal spray Place 1 spray into both nostrils daily as needed for allergies or rhinitis. 16 g 3   glucose blood (ONETOUCH VERIO) test strip CHECK BLOOD SUGAR TWICE DAILY 200 strip 3   insulin  aspart (NOVOLOG  FLEXPEN) 100 UNIT/ML FlexPen Inject 0-15 Units into the skin 3 (three) times daily before meals. CBG 70 - 120: 0 units CBG 121 - 150: 2 units CBG 151 - 200: 3 units CBG 201 - 250: 5 units CBG 251 - 300: 8 units CBG 301 - 350: 11 units CBG 351 -  400: 15 units CBG > 400: call MD and obtain STAT lab verification 15 mL 0   insulin  glargine (LANTUS ) 100 UNIT/ML Solostar Pen Inject 20 Units into the skin daily. 15 mL 11   Insulin  Pen Needle (BD PEN NEEDLE NANO 2ND GEN) 32G X 4 MM MISC Use as directed 3 (three) times daily. 300 each 3   ipratropium (ATROVENT ) 0.06 % nasal spray Place 2 sprays into both nostrils 3 (three) times daily. 15 mL 12   ipratropium-albuterol  (DUONEB) 0.5-2.5 (3) MG/3ML SOLN Take 3 mLs by nebulization every 6 (six) hours as needed. (Patient taking differently: Take 3 mLs by nebulization every 6 (six) hours as needed (SOB/Wheezing).) 360 mL 1   Lancet Devices (LANCING DEVICE) MISC Use as advised - for Delica Lancets 1 each 0   Lancets (ONETOUCH DELICA PLUS LANCET33G) MISC USE TO MONITOR GLUCOSE  LEVELS TWICE DAILY 200 each 3   Multiple Vitamins-Minerals (CENTRUM SILVER ULTRA WOMENS PO) Take 1 tablet by mouth daily.     nitroGLYCERIN  (NITROSTAT ) 0.4 MG SL tablet Place 1 tablet (0.4 mg total) under the tongue every 5 (five) minutes as needed for chest pain. 25 tablet 3   OXYGEN  Inhale 3 Doses into the lungs as needed. 3 liters     potassium chloride  SA (KLOR-CON  M) 20 MEQ tablet Take 2 tablets (40 mEq total) by mouth daily. 180  tablet 3   Semaglutide ,0.25 or 0.5MG /DOS, (OZEMPIC , 0.25 OR 0.5 MG/DOSE,) 2 MG/3ML SOPN Inject 0.5 mg into the skin once a week. 9 mL 3   Tiotropium Bromide -Olodaterol (STIOLTO RESPIMAT ) 2.5-2.5 MCG/ACT AERS Inhale 2 puffs into the lungs daily. 4 g 11   torsemide  (DEMADEX ) 20 MG tablet Take 2 tablets (40 mg total) by mouth 2 (two) times daily. 360 tablet 3   traZODone  (DESYREL ) 50 MG tablet Take 0.5-1 tablets (25-50 mg total) by mouth at bedtime as needed for sleep. 30 tablet 5   Vitamin D , Cholecalciferol , 25 MCG (1000 UT) CAPS Take 1 capsule by mouth daily.     No facility-administered medications prior to visit.     Review of Systems: as above    Physical Exam:  There were no vitals taken for this visit.  GEN: Pleasant, interactive, well-kempt; chronically-ill appearing; in no acute distress HEENT:  Normocephalic and atraumatic. PERRLA. Sclera white. Nasal turbinates erythematous, moist and patent bilaterally. No rhinorrhea present. Oropharynx pink and moist, without exudate or edema. No lesions, ulcerations, or postnasal drip.  NECK:  Supple w/ fair ROM. No JVD present. Normal carotid impulses w/o bruits. Thyroid  symmetrical with no goiter or nodules palpated. Cervical lymphadenopathy.   CV: Irregular rhythm, rate controlled, no m/r/g, no peripheral edema. Pulses intact, +2 bilaterally. No cyanosis, pallor or clubbing. PULMONARY:  Unlabored, regular breathing. Diminished bilaterally A&P w/o wheezes/rales/rhonchi. No accessory muscle use.  GI: BS present and normoactive. Soft, non-tender to palpation.  MSK: No erythema, warmth or tenderness.  Neuro: A/Ox3. No focal deficits noted.   Skin: Warm, no lesions or rashe Psych: Normal affect and behavior. Judgement and thought content appropriate.     Lab Results:  CBC    Component Value Date/Time   WBC 8.6 11/26/2024 0452   RBC 4.56 11/26/2024 0452   HGB 13.9 11/26/2024 0452   HCT 43.1 11/26/2024 0452   PLT 142 (L) 11/26/2024  0452   MCV 94.5 11/26/2024 0452   MCH 30.5 11/26/2024 0452   MCHC 32.3 11/26/2024 0452   RDW 12.2 11/26/2024 0452   LYMPHSABS 0.8 11/23/2024 1303  MONOABS 0.6 11/23/2024 1303   EOSABS 0.0 11/23/2024 1303   BASOSABS 0.0 11/23/2024 1303    BMET    Component Value Date/Time   NA 138 11/26/2024 0452   NA 138 07/02/2016 0000   NA 138 07/02/2016 0000   K 3.5 11/26/2024 0452   CL 97 (L) 11/26/2024 0452   CO2 30 11/26/2024 0452   GLUCOSE 164 (H) 11/26/2024 0452   BUN 19 11/26/2024 0452   BUN 25 (A) 07/02/2016 0000   BUN 25 (A) 07/02/2016 0000   CREATININE 1.12 (H) 11/26/2024 0452   CALCIUM  9.3 11/26/2024 0452   GFRNONAA 46 (L) 11/26/2024 0452   GFRAA 46 (L) 06/09/2020 1133    BNP    Component Value Date/Time   BNP 222.7 (H) 10/08/2023 0927     Imaging:  No results found.   Administration History     None          Latest Ref Rng & Units 05/21/2014   10:58 AM  PFT Results  FVC-Pre L 2.10   FVC-Predicted Pre % 110   FVC-Post L 2.09   FVC-Predicted Post % 110   Pre FEV1/FVC % % 67   Post FEV1/FCV % % 66   FEV1-Pre L 1.40   FEV1-Predicted Pre % 95   FEV1-Post L 1.37   DLCO uncorrected ml/min/mmHg 8.97   DLCO UNC% % 39   DLCO corrected ml/min/mmHg 8.97   DLCO COR %Predicted % 39   DLVA Predicted % 54   TLC L 4.69   TLC % Predicted % 95   RV % Predicted % 117     No results found for: NITRICOXIDE  TEST/EVENTS:  05/21/2014 PFT: FVC 110, FEV1 94, ratio 66, TLC 95, DLCOcor 39. No BD 06/22/2023 ONO on room air: 41 min 7 sec </88%, SpO2 low 81% 10/08/2023 CXR: bibasilar opacities concerning for potential pna or aspiration pneumonitis L>R; atherosclerosis, cardiomegaly 01/03/2024 CXR: improved aeration; no airspace disease. Coarsened interstitial markings. Emphysema. CAD.    Assessment & Plan:   No problem-specific Assessment & Plan notes found for this encounter.  COPD with acute exacerbation (HCC) Mild AECOPD with increased dyspnea over the last month.  Recent cough seems to be related to postnasal drainage and URI symptoms. COVID test today negative. Likely viral in nature. Will treat her with prednisone  burst and reassess response. Continue maintenance therapies. If no improvement or worsening occurs, recommend further workup with imaging and consider empiric abx. Action plan in place. Mucociliary clearance and supportive care recommended.   Patient Instructions  -Continue Stiolto 2 puffs daily. This is your new maintenance inhaler that you will use daily regardless of symptoms -Continue albuterol  inhaler 2 puffs or 3 mL neb every 6 hours as needed for shortness of breath or wheezing. This is a rescue inhaler and you only need to use these if you are having trouble with your breathing -Continue flonase  nasal spray 2 sprays each nostril daily  -Continue supplemental oxygen  3 lpm with activity and at night for goal oxygen  levels greater than 88-90%   Prednisone  40 mg daily for 5 days. Take in AM with food Over the counter Mucinex  or Mucinex  DM if cough is more bothersome until congestion improves Atrovent  (ipratropium) nasal spray 2 sprays each nostril Three times a day as needed for nasal drainage/congestion    COVID test negative    Follow up in 6-8 weeks with any new MD to establish care. If symptoms do not improve or worsen, please contact office for  sooner follow up or seek emergency care.        Advised if symptoms do not improve or worsen, to please contact office for sooner follow up or seek emergency care.   I spent 35 minutes of dedicated to the care of this patient on the date of this encounter to include pre-visit review of records, face-to-face time with the patient discussing conditions above, post visit ordering of testing, clinical documentation with the electronic health record, making appropriate referrals as documented, and communicating necessary findings to members of the patients care team.  Winfred Marny LITTIE Adrien Winfred, MD 12/30/2024  Pt aware and understands NP's role.   "

## 2024-12-31 ENCOUNTER — Other Ambulatory Visit (HOSPITAL_COMMUNITY): Payer: Self-pay | Admitting: Internal Medicine

## 2024-12-31 ENCOUNTER — Other Ambulatory Visit (HOSPITAL_COMMUNITY): Payer: Self-pay

## 2025-01-01 ENCOUNTER — Ambulatory Visit: Admitting: Internal Medicine

## 2025-01-01 ENCOUNTER — Encounter

## 2025-01-01 ENCOUNTER — Other Ambulatory Visit (HOSPITAL_COMMUNITY): Payer: Self-pay

## 2025-01-01 ENCOUNTER — Other Ambulatory Visit: Payer: Self-pay

## 2025-01-01 DIAGNOSIS — J441 Chronic obstructive pulmonary disease with (acute) exacerbation: Secondary | ICD-10-CM

## 2025-01-01 MED ORDER — APIXABAN 5 MG PO TABS
5.0000 mg | ORAL_TABLET | Freq: Two times a day (BID) | ORAL | 3 refills | Status: AC
  Filled 2025-01-01 – 2025-01-07 (×2): qty 180, 90d supply, fill #0

## 2025-01-01 NOTE — Progress Notes (Deleted)
 Patient ID: [name], female   DOB: [birth year], 89 y.o.   MRN: [id number]  HPI: [name] is a 89 y.o.-year-old female, self-referred (her daughter is also my patient), for management of DM2, dx in 1994, insulin -dependent, uncontrolled, with complications (CAD, s/p NSTEMI 2008; CHF; PAD; atrial fibrillation/atrial flutter; peripheral neuropathy; CKD stage IIIa).   Last visit with me 4 months ago.   Patient's caregiver accompanies her today and offers information about patient's diet, activity, and insulin  doses.  Interim history: + chest pain - on prn NTG; no shortness of breath, nausea. She was recently admitted with DKA and AKI in the setting of a UTI in 10/2024.  Glucose was >600, and she was confused.  Reviewed HbA1c: Lab Results  Component Value Date   HGBA1C QNSTST 11/24/2024   HGBA1C 8.7 (A) 08/30/2024   HGBA1C 8.3 (A) 04/26/2024   HGBA1C 7.5 (H) 10/08/2023   HGBA1C 6.9 06/23/2023   HGBA1C 7.3 (A) 02/03/2023   HGBA1C 7.0 (A) 07/29/2022   HGBA1C 8.4 (A) 03/26/2022   HGBA1C 7.3 (A) 12/25/2021   HGBA1C 9.0 (A) 09/14/2021   HGBA1C 8.3 (A) 05/28/2021   HGBA1C 8.0 (A) 03/24/2021   HGBA1C 9.5 (A) 12/02/2020   HGBA1C 7.8 (A) 08/21/2020   HGBA1C 8.3 (H) 05/15/2020   HGBA1C 8.0 (A) 05/06/2020   HGBA1C 9.2 (H) 02/20/2020   HGBA1C 7.9 (A) 11/14/2019   HGBA1C 7.6 (A) 08/22/2019   HGBA1C 8.1 (A) 05/01/2019   HGBA1C 8.9 (H) 01/31/2019   HGBA1C 7.0 (A) 12/06/2018   HGBA1C 6.5 (A) 09/20/2018   HGBA1C 6.4 (A) 07/19/2018   HGBA1C 7.3 05/08/2018   HGBA1C 8.0 (H) 01/30/2018   HGBA1C 8.3 11/23/2017   HGBA1C 7.4 08/23/2017   HGBA1C 8.2 01/24/2017   HGBA1C 7.7 09/08/2016   HGBA1C 8.4 (H) 06/09/2016   HGBA1C 7.8 (H) 03/04/2016   HGBA1C 7.6 12/08/2015   HGBA1C 8.3 08/26/2015   HGBA1C 7.9 (H) 05/24/2015   HGBA1C 8.6 (H) 02/06/2015   HGBA1C 8.7 (H) 11/15/2014   HGBA1C 8.1 (H) 08/21/2014   HGBA1C 9.6 (H) 01/21/2014   HGBA1C 9.8 (H) 09/13/2013   She was previously  on: - Humalog  75/25 >> 70/30 Novolin  (24$ at [facility], w/o Rx) 32 units before breakfast and 6 units before dinner >> .SABRASABRA 14 units 30 min before breakfast and 10-12 units 30 min before dinner - Ozempic  0.25 >> 0.5 mg weekly-added 08/2021 >> stopped 12/2021 2/2 $$ but now new insurance >> restarted (patient assistance)  Then on: - Ozempic  0.5 >> 0.25 mg weekly 2/2 weight loss >> 0.5 mg weekly - >> stopped 2/2 lows 06/2023 - NovoLog  4-6 units 3x a day 10-15 min before meals >> taking this after meals Before b'fast:6 units  Before lunch: 8-10 units >> 4-6 >> 8 Units  At last visit I recommended the following regimen: - NovoLog  10-15 minutes before all 3 meals: Smaller meal: 6 units  Regular meal: 8 units Larger meal: 10 units - Ozempic  0.5 mg weekly  Pt checks her sugars >4x a day and they are:  Previously:  Previously:  Lowest sugar was 49 (taking 15-20 units at that time) >> 70s; she has hypoglycemia awareness at 70.  Highest sugar was 497 (steroids) >>... 300s.  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  Pt's meals are: - Breakfast: frozen banana, berries, oatmeal, protein powder >> yogurt and fruit - dinner: Includes soup  But she has lunch and dinner in the cafeteria [employer] - facility: [employer]):   -+  CKD, last BUN/creatinine:  Lab Results  Component Value Date   BUN 19 11/26/2024   BUN 21 11/25/2024   CREATININE 1.12 (H) 11/26/2024   CREATININE 1.06 (H) 11/25/2024   Lab Results  Component Value Date   MICRALBCREAT 427.9 (H) 10/11/2024   MICRALBCREAT 661 (H) 08/30/2024   MICRALBCREAT 300 (H) 04/26/2024   MICRALBCREAT 7.3 10/28/2009   MICRALBCREAT 10.3 01/25/2008   MICRALBCREAT 31.5 (H) 10/13/2007  She is not on an ACE inhibitor/ARB  -+ HL; last set of lipids: Lab Results  Component Value Date   CHOL 315 (H) 01/18/2023   HDL 54.10 01/18/2023   LDLCALC 229 (H) 01/18/2023   LDLDIRECT 144.6 02/03/2010   TRIG 161.0 (H) 01/18/2023   CHOLHDL 6  01/18/2023  She had muscle weakness from statins.   - last eye exam was in 2022 or 2023: No DR reportedly.  - no numbness and tingling in her feet.  She saw podiatry, but not recently.  Last foot exam 09/20/2024.  Pt has FH of DM in mother.  She also has a history of HTN, osteoporosis, protein-calorie malnutrition, iron deficiency anemia. No FH of MTC, no pancreatitis hx.  ROS: + See HPI  Past Medical History:  Diagnosis Date   Anemia    iron defficiency   Anginal pain    Arthritis    knees, feet, hands; joints (05/27/2015)   Asthma    Atrial fibrillation or flutter    s/p RFCA 7/08;   s/p DCCV in past;   previously on amiodarone ;  amio stopped due to lung toxicity   CAD (coronary artery disease)    s/p NSTEMI tx with BMS to OM1 3/08;  cath 3/08: pOM 99% tx with PCI, pLAD 20%, ? mod stenosis at the AM   CAP (community acquired pneumonia) 05/24/2015   CHF (congestive heart failure) (HCC)    Chronic diastolic heart failure (HCC)    echo 11/11:  EF 55-60%, severe LVH, mod LAE, mild MR, mildly increased PASP   COPD (chronic obstructive pulmonary disease) (HCC)    Degenerative joint disease    Dystrophy, corneal stromal    Heart murmur    HLD (hyperlipidemia)    HTN (hypertension)    essential nos   Hypopotassemia    PMH of   Muscle pain    Myocardial infarction (HCC) 2008   OSA on CPAP    Osteoporosis    Pneumonia 05/27/2015   Protein calorie malnutrition    Rash and nonspecific skin eruption    both arms,awaiting bio   dr robinson   Seasonal allergies    Shortness of breath dyspnea    Type II diabetes mellitus (HCC)    Dr Kassie   Past Surgical History:  Procedure Laterality Date   A FLUTTER ABLATION     Dr Waddell   ABDOMINAL AORTOGRAM W/LOWER EXTREMITY Bilateral 04/11/2020   Procedure: ABDOMINAL AORTOGRAM W/LOWER EXTREMITY;  Surgeon: Eliza Lonni RAMAN, MD;  Location: [facility] INVASIVE CV LAB;  Service: Cardiovascular;  Laterality: Bilateral;   CATARACT EXTRACTION  W/ INTRAOCULAR LENS  IMPLANT, BILATERAL Bilateral    CHOLECYSTECTOMY     COLONOSCOPY  6/07   2 polyps Dr. Cinda in [place]   colonoscopy with polypectomy      X 2; [facility]   CORNEAL TRANSPLANT Bilateral    CORONARY ANGIOPLASTY WITH STENT PLACEMENT  02/2007   BMS w/Dr Waddell   CORONARY ATHERECTOMY N/A 05/16/2020   Procedure: CORONARY ATHERECTOMY;  Surgeon: Jordan, Peter M, MD;  Location: [facility]  INVASIVE CV LAB;  Service: Cardiovascular;  Laterality: N/A;   CORONARY BALLOON ANGIOPLASTY N/A 05/16/2020   Procedure: CORONARY BALLOON ANGIOPLASTY;  Surgeon: Jordan, Peter M, MD;  Location: [facility] INVASIVE CV LAB;  Service: Cardiovascular;  Laterality: N/A;   CORONARY STENT INTERVENTION N/A 05/16/2020   Procedure: CORONARY STENT INTERVENTION;  Surgeon: Jordan, Peter M, MD;  Location: [facility] INVASIVE CV LAB;  Service: Cardiovascular;  Laterality: N/A;   CORONARY ULTRASOUND/IVUS N/A 05/16/2020   Procedure: Intravascular Ultrasound/IVUS;  Surgeon: Jordan, Peter M, MD;  Location: [facility] INVASIVE CV LAB;  Service: Cardiovascular;  Laterality: N/A;   EYE SURGERY     gravid 2 para 2     KNEE CARTILAGE SURGERY Right    LEFT AND RIGHT HEART CATHETERIZATION WITH CORONARY ANGIOGRAM N/A 05/07/2014   Procedure: LEFT AND RIGHT HEART CATHETERIZATION WITH CORONARY ANGIOGRAM;  Surgeon: Peter M Jordan, MD;  Location: [facility] CATH LAB;  Service: Cardiovascular;  Laterality: N/A;   PERIPHERAL VASCULAR INTERVENTION  04/11/2020   Procedure: PERIPHERAL VASCULAR INTERVENTION;  Surgeon: Eliza Lonni RAMAN, MD;  Location: [facility] INVASIVE CV LAB;  Service: Cardiovascular;;   RIGHT HEART CATH N/A 02/04/2021   Procedure: RIGHT HEART CATH;  Surgeon: Cherrie Toribio SAUNDERS, MD;  Location: [facility] INVASIVE CV LAB;  Service: Cardiovascular;  Laterality: N/A;   RIGHT/LEFT HEART CATH AND CORONARY ANGIOGRAPHY N/A 05/16/2020   Procedure: RIGHT/LEFT HEART CATH AND CORONARY ANGIOGRAPHY;  Surgeon: Cherrie Toribio SAUNDERS, MD;  Location: [facility] INVASIVE CV LAB;  Service:  Cardiovascular;  Laterality: N/A;   TOTAL KNEE ARTHROPLASTY Right 06/28/2016   Procedure: TOTAL KNEE ARTHROPLASTY;  Surgeon: Dempsey Sensor, MD;  Location: [facility] OR;  Service: Orthopedics;  Laterality: Right;   Social History   Socioeconomic History   Marital status: Widowed    Spouse name: Not on file   Number of children: 2   Years of education: 16   Highest education level: Bachelor's degree (e.g., BA, AB, BS)  Occupational History   Occupation: Teacher/ Retired  Tobacco Use   Smoking status: Former    Current packs/day: 0.00    Average packs/day: 0.3 packs/day for 18.0 years (4.5 ttl pk-yrs)    Types: Cigarettes    Start date: 12/27/1950    Quit date: 12/27/1968    Years since quitting: 56.0   Smokeless tobacco: Never   Tobacco comments:    smoked 1952- 1970, up to 1 pp week  Vaping Use   Vaping status: Never Used  Substance and Sexual Activity   Alcohol use: Yes    Alcohol/week: 1.0 standard drink of alcohol    Types: 1 Glasses of wine per week    Comment: occass   Drug use: No   Sexual activity: Not Currently  Other Topics Concern   Not on file  Social History Narrative   Teacher. Widowed. Rarely drinks cafeine.    Social Drivers of Health   Tobacco Use: Low Risk (12/14/2024)   Received from [facility]   Patient History    Smoking Tobacco Use: Never    Smokeless Tobacco Use: Never    Passive Exposure: Not on file  Recent Concern: Tobacco Use - Medium Risk (11/25/2024)   Patient History    Smoking Tobacco Use: Former    Smokeless Tobacco Use: Never    Passive Exposure: Not on file  Financial Resource Strain: Low Risk (09/11/2024)   Overall Financial Resource Strain (CARDIA)    Difficulty of Paying Living Expenses: Not hard at all  Food Insecurity: No Food Insecurity (11/25/2024)   Epic  Worried About Programme Researcher, Broadcasting/film/video in the Last Year: Never true    Ran Out of Food in the Last Year: Never true  Transportation Needs: No Transportation Needs (11/25/2024)    Epic    Lack of Transportation (Medical): No    Lack of Transportation (Non-Medical): No  Physical Activity: Sufficiently Active (09/11/2024)   Exercise Vital Sign    Days of Exercise per Week: 5 days    Minutes of Exercise per Session: 30 min  Stress: No Stress Concern Present (09/11/2024)   [employer] of Occupational Health - Occupational Stress Questionnaire    Feeling of Stress: Not at all  Social Connections: Moderately Integrated (11/23/2024)   Social Connection and Isolation Panel    Frequency of Communication with Friends and Family: More than three times a week    Frequency of Social Gatherings with Friends and Family: More than three times a week    Attends Religious Services: More than 4 times per year    Active Member of [employer] or Organizations: Yes    Attends Banker Meetings: More than 4 times per year    Marital Status: Widowed  Intimate Partner Violence: Not At Risk (11/25/2024)   Epic    Fear of Current or Ex-Partner: No    Emotionally Abused: No    Physically Abused: No    Sexually Abused: No  Depression (PHQ2-9): Low Risk (10/10/2024)   Depression (PHQ2-9)    PHQ-2 Score: 0  Alcohol Screen: Low Risk (09/11/2024)   Alcohol Screen    Last Alcohol Screening Score (AUDIT): 2  Housing: Low Risk (11/25/2024)   Epic    Unable to Pay for Housing in the Last Year: No    Number of Times Moved in the Last Year: 0    Homeless in the Last Year: No  Utilities: Not At Risk (11/25/2024)   Epic    Threatened with loss of utilities: No  Health Literacy: Adequate Health Literacy (09/11/2024)   B1300 Health Literacy    Frequency of need for help with medical instructions: Never   Current Outpatient Medications on File Prior to Visit  Medication Sig Dispense Refill   albuterol  (VENTOLIN  HFA) 108 (90 Base) MCG/ACT inhaler Inhale 2 puffs into the lungs every 6 (six) hours as needed for wheezing or shortness of breath. 6.7 g 2   amLODipine  (NORVASC ) 5 MG tablet  TAKE 1 TABLET BY MOUTH DAILY 90 tablet 3   apixaban  (ELIQUIS ) 5 MG TABS tablet Take 1 tablet (5 mg total) by mouth 2 (two) times daily. 180 tablet 3   Blood Glucose Monitoring Suppl (ONETOUCH VERIO) w/Device KIT Use as instructed to check blood sugar 1 kit 0   carvedilol  (COREG ) 6.25 MG tablet Take 1 tablet (6.25 mg total) by mouth 2 (two) times daily with a meal. 180 tablet 3   Continuous Glucose Receiver (FREESTYLE LIBRE 3 READER) DEVI Use to check glucose continuously. 1 each 0   Continuous Glucose Sensor (FREESTYLE LIBRE 3 PLUS SENSOR) MISC Inject 1 Device into the skin for continuous glucose monitoring. Change every 15 days 6 each 3   fluticasone  (FLONASE ) 50 MCG/ACT nasal spray Place 1 spray into both nostrils daily as needed for allergies or rhinitis. 16 g 3   glucose blood (ONETOUCH VERIO) test strip CHECK BLOOD SUGAR TWICE DAILY 200 strip 3   insulin  aspart (NOVOLOG  FLEXPEN) 100 UNIT/ML FlexPen Inject 0-15 Units into the skin 3 (three) times daily before meals. CBG 70 - 120: 0 units CBG  121 - 150: 2 units CBG 151 - 200: 3 units CBG 201 - 250: 5 units CBG 251 - 300: 8 units CBG 301 - 350: 11 units CBG 351 - 400: 15 units CBG > 400: call MD and obtain STAT lab verification 15 mL 0   insulin  glargine (LANTUS ) 100 UNIT/ML Solostar Pen Inject 20 Units into the skin daily. 15 mL 11   Insulin  Pen Needle (BD PEN NEEDLE NANO 2ND GEN) 32G X 4 MM MISC Use as directed 3 (three) times daily. 300 each 3   ipratropium (ATROVENT ) 0.06 % nasal spray Place 2 sprays into both nostrils 3 (three) times daily. 15 mL 12   ipratropium-albuterol  (DUONEB) 0.5-2.5 (3) MG/3ML SOLN Take 3 mLs by nebulization every 6 (six) hours as needed. (Patient taking differently: Take 3 mLs by nebulization every 6 (six) hours as needed (SOB/Wheezing).) 360 mL 1   Lancet Devices (LANCING DEVICE) MISC Use as advised - for Delica Lancets 1 each 0   Lancets (ONETOUCH DELICA PLUS LANCET33G) MISC USE TO MONITOR GLUCOSE  LEVELS TWICE DAILY  200 each 3   Multiple Vitamins-Minerals (CENTRUM SILVER ULTRA WOMENS PO) Take 1 tablet by mouth daily.     nitroGLYCERIN  (NITROSTAT ) 0.4 MG SL tablet Place 1 tablet (0.4 mg total) under the tongue every 5 (five) minutes as needed for chest pain. 25 tablet 3   OXYGEN  Inhale 3 Doses into the lungs as needed. 3 liters     potassium chloride  SA (KLOR-CON  M) 20 MEQ tablet Take 2 tablets (40 mEq total) by mouth daily. 180 tablet 3   Semaglutide ,0.25 or 0.5MG /DOS, (OZEMPIC , 0.25 OR 0.5 MG/DOSE,) 2 MG/3ML SOPN Inject 0.5 mg into the skin once a week. 9 mL 3   Tiotropium Bromide -Olodaterol (STIOLTO RESPIMAT ) 2.5-2.5 MCG/ACT AERS Inhale 2 puffs into the lungs daily. 4 g 11   torsemide  (DEMADEX ) 20 MG tablet Take 2 tablets (40 mg total) by mouth 2 (two) times daily. 360 tablet 3   traZODone  (DESYREL ) 50 MG tablet Take 0.5-1 tablets (25-50 mg total) by mouth at bedtime as needed for sleep. 30 tablet 5   Vitamin D , Cholecalciferol , 25 MCG (1000 UT) CAPS Take 1 capsule by mouth daily.     No current facility-administered medications on file prior to visit.   Allergies  Allergen Reactions   Levaquin  [Levofloxacin ] Shortness Of Breath and Swelling    angioedema   Lobster [Shellfish Allergy] Other (See Comments)    angioedema   Penicillins Rash    Has patient had a PCN reaction causing immediate rash, facial/tongue/throat swelling, SOB or lightheadedness with hypotension: Yes Has patient had a PCN reaction causing severe rash involving mucus membranes or skin necrosis: No Has patient had a PCN reaction that required hospitalization: No Has patient had a PCN reaction occurring within the last 10 years: No If all of the above answers are NO, then may proceed with Cephalosporin use.   Valsartan Other (See Comments)    REACTION: angioedema   Codeine Other (See Comments)    Mental status changes   Lipitor [Atorvastatin ] Other (See Comments)    weakness   Oxycodone  Other (See Comments)    hallucinations    Statins     Made her too weak   Tramadol    Amlodipine  Besylate Other (See Comments)    REACTION: tingling in lips & gum edema 7/12: talked to patient, states she is tolerating well   Crestor  [Rosuvastatin  Calcium ] Rash   Tramadol Hcl Nausea And Vomiting   Family  History  Problem Relation Age of Onset   Diabetes Mother    Hypertension Mother    Transient ischemic attack Mother    Arthritis Mother    Heart attack Father 68   Arthritis Father    Hypertension Father    Breast cancer Maternal Aunt    Arthritis Maternal Aunt    PE: There were no vitals taken for this visit. Wt Readings from Last 10 Encounters:  11/23/24 140 lb 14 oz (63.9 kg)  11/15/24 149 lb 3.2 oz (67.7 kg)  11/07/24 151 lb (68.5 kg)  10/10/24 145 lb (65.8 kg)  09/20/24 145 lb (65.8 kg)  09/11/24 145 lb (65.8 kg)  08/30/24 148 lb 3.2 oz (67.2 kg)  08/21/24 148 lb (67.1 kg)  04/26/24 149 lb 9.6 oz (67.9 kg)  04/19/24 147 lb (66.7 kg)   Constitutional: Normal weight, in NAD Eyes: EOMI, no exophthalmos ENT: no thyromegaly, no cervical lymphadenopathy Cardiovascular: RRR, No MRG, B pitting LE edema - wears compression hoses Respiratory: CTA B Musculoskeletal: no deformities Skin: + stasis dermatitis rash - bilateral lower legs Neurological: no tremor with outstretched hands . ASSESSMENT: 1. DM2, insulin -dependent, uncontrolled, with complications - CAD, s/p NSTEMI 2008 - CHF - PAD - h/o R stent - atrial fibrillation/atrial flutter -previously on amiodarone  - peripheral neuropathy - CKD stage IIIa  2. Obesity class 1  3. HL  PLAN:  1. Patient with longstanding, uncontrolled, type 2 diabetes, on mealtime insulin  and GLP-1 receptor agonist, with still poor control.  Latest HbA1c was 8.7% 4 months ago.  Since last visit, however, she had a DKA episode with blood sugars above 600 in the setting of a UTI. - At last visit, reviewing the CGM trends, sugars were again fluctuating in the very high range,  above target most of the time and increasing after every meal.  They were abruptly dropping after lunch and less abruptly overnight.  Upon questioning, she was still taking NovoLog  after meals as she forgot about the advised to take it 15 to 20 minutes before meals.  We discussed about the absolute importance of splitting the insulin  in 15 minutes before meals.  She previously requested a less complex regimen but unfortunately, other than stopping her long-acting insulin  we were not able to change this.  At last visit, her caregiver mentions the possibility of just a sliding scale to simplify her regimen but I did not feel that this could have led to good control.  We did discuss about taking certain amount of insulin  with smaller, regular, larger meals and we discussed about examples of small, regular, and large meals.  I explained that this categorizing does not necessarily relate to pure volume, but the amount of carbs in the meal. CGM interpretation: -At today's visit, we reviewed her CGM downloads: It appears that *** of values are in target range (goal >70%), while *** are higher than 180 (goal <25%), and *** are lower than 70 (goal <4%).  The calculated average blood sugar is ***.  The projected HbA1c for the next 3 months (GMI) is ***. -Reviewing the CGM trends, ***  - I suggested to:  Patient Instructions  Please use the following regimen: - NovoLog  10-15 minutes before all 3 meals: Smaller meal: 6 units  Regular meal: 8 units Larger meal: 10 units - Ozempic  0.5 mg weekly  Please return in 4 months.  - we checked her HbA1c: 7%  - advised to check sugars at different times of the day - 4x a  day, rotating check times - advised for yearly eye exams >> she is UTD - return to clinic in 4 months  2.  Obesity class I -At previous visits, I suggested to increase intake of fresh fruit, dried fruit, and other snacks.  I also recommended to try to switch from smoothies in the morning to  oatmeal. -She lost 1 pound before last visit - Will continue Ozempic  which should also help with weight loss  3. HL - Latest lipid panel was reviewed from 2024: LDL was very high: Lab Results  Component Value Date   CHOL 315 (H) 01/18/2023   HDL 54.10 01/18/2023   LDLCALC 229 (H) 01/18/2023   LDLDIRECT 144.6 02/03/2010   TRIG 161.0 (H) 01/18/2023   CHOLHDL 6 01/18/2023  - She cannot tolerate statins due to muscle weakness.  Options are ezetimibe  and PCSK9 inhibitors.  However, she is on palliative care with only mandatory medications.  We discussed about improving diet.   Component     Latest Ref Rng 08/30/2024  Hemoglobin A1C     4.0 - 5.6 % 8.7 !   Creatinine, Urine     20 - 275 mg/dL 889   Microalb, Ur     mg/dL 27.2   MICROALB/CREAT RATIO     <30 mg/g creat 661 (H)   POC Glucose     70 - 99 mg/dl 762 !   UACR is higher.  She has a history of angioedema with valsartan.  Therefore, we cannot use RAAS inhibitors.  I am worried about the fast worsening of her ACR.  I will call her back for a repeat ACR and a BMP in 2 weeks.  If not improving, she will need a referral to nephrology.  Lela Fendt, MD PhD [facility]

## 2025-01-02 ENCOUNTER — Encounter: Payer: Self-pay | Admitting: Pharmacist

## 2025-01-02 ENCOUNTER — Other Ambulatory Visit: Payer: Self-pay

## 2025-01-03 ENCOUNTER — Other Ambulatory Visit (HOSPITAL_COMMUNITY): Payer: Self-pay

## 2025-01-03 ENCOUNTER — Other Ambulatory Visit: Payer: Self-pay

## 2025-01-03 ENCOUNTER — Encounter: Payer: Self-pay | Admitting: Internal Medicine

## 2025-01-03 NOTE — Progress Notes (Signed)
 "   Subjective:    Patient ID: [name], female    DOB: November 15, 1931, 89 y.o.   MRN: [id number]      HPI [name] is here for a physical exam. Chief Complaint  Patient presents with   Follow-up    Having consistant congestion and constant urination     2 choking episodes in past 3 weeks. Was eating and chicken and it got stuck.   Doing speech therapy and different exercises where she lives which is helping.  She has been more careful and is cutting her food up smaller and has not had any choking episodes since this.  She is not interested in having this evaluated further at this time.  Overall she feels she is doing well.  She does have frequent urination, which is chronic.  She does not feel like it is more than usual.  She feels her memory is worse.  She is not limited by her health.  Overall she feels content and ready to die when it is her time.     Medications and allergies reviewed with patient and updated if appropriate.  Medications Ordered Prior to Encounter[1]  Review of Systems  Constitutional:  Negative for fever.  HENT:  Positive for congestion.   Eyes:  Negative for visual disturbance.  Respiratory:  Positive for cough (dry), choking (3 episodes in past 3 weeks) and shortness of breath (increased). Negative for wheezing.   Cardiovascular:  Positive for chest pain (tightness - relieved by ntg) and leg swelling. Negative for palpitations.  Gastrointestinal:  Positive for abdominal pain (before Bm) and constipation. Negative for blood in stool and diarrhea.       Occ gerd  Genitourinary:  Positive for frequency. Negative for dysuria and hematuria.  Musculoskeletal:  Positive for arthralgias and back pain.  Neurological:  Positive for dizziness (occ with quick movements). Negative for headaches.  Psychiatric/Behavioral:  Negative for dysphoric mood and sleep disturbance. The patient is not nervous/anxious.        Objective:   Vitals:   01/04/25 1039   BP: 110/64  Pulse: 89  Temp: 97.6 F (36.4 C)  SpO2: 90%   BP Readings from Last 3 Encounters:  01/04/25 110/64  11/26/24 (!) 139/47  11/15/24 (!) 148/60   Wt Readings from Last 3 Encounters:  01/04/25 149 lb (67.6 kg)  11/23/24 140 lb 14 oz (63.9 kg)  11/15/24 149 lb 3.2 oz (67.7 kg)   Body mass index is 26.39 kg/m.    Physical Exam Constitutional:      General: She is not in acute distress.    Appearance: Normal appearance. She is not ill-appearing.  HENT:     Head: Normocephalic.     Right Ear: Tympanic membrane, ear canal and external ear normal.     Left Ear: Tympanic membrane, ear canal and external ear normal.     Mouth/Throat:     Mouth: Mucous membranes are moist.     Pharynx: No posterior oropharyngeal erythema.  Eyes:     Conjunctiva/sclera: Conjunctivae normal.  Cardiovascular:     Rate and Rhythm: Normal rate and regular rhythm.  Pulmonary:     Effort: Pulmonary effort is normal. No respiratory distress.     Breath sounds: Normal breath sounds. No wheezing or rales.  Abdominal:     General: There is no distension.     Palpations: Abdomen is soft.     Tenderness: There is no abdominal tenderness.  Musculoskeletal:     Cervical  back: Neck supple. No tenderness.     Right lower leg: No edema.     Left lower leg: No edema.  Lymphadenopathy:     Cervical: No cervical adenopathy.  Skin:    General: Skin is warm and dry.     Findings: No rash.  Neurological:     Mental Status: She is alert. Mental status is at baseline.  Psychiatric:        Mood and Affect: Mood normal.            Assessment & Plan:   Physical exam: Screening blood work deferred-reviewed most  She has had her flu shot and COVID shots. Exercise is limited secondary to her lung condition Eye appointment up-to-date  See Problem List for Assessment and Plan of chronic medical problems.       [1]  Current Outpatient Medications on File Prior to Visit  Medication Sig  Dispense Refill   albuterol  (VENTOLIN  HFA) 108 (90 Base) MCG/ACT inhaler Inhale 2 puffs into the lungs every 6 (six) hours as needed for wheezing or shortness of breath. 6.7 g 2   amLODipine  (NORVASC ) 5 MG tablet TAKE 1 TABLET BY MOUTH DAILY 90 tablet 3   apixaban  (ELIQUIS ) 5 MG TABS tablet Take 1 tablet (5 mg total) by mouth 2 (two) times daily. 180 tablet 3   Blood Glucose Monitoring Suppl (ONETOUCH VERIO) w/Device KIT Use as instructed to check blood sugar 1 kit 0   carvedilol  (COREG ) 6.25 MG tablet Take 1 tablet (6.25 mg total) by mouth 2 (two) times daily with a meal. 180 tablet 3   Continuous Glucose Receiver (FREESTYLE LIBRE 3 READER) DEVI Use to check glucose continuously. 1 each 0   Continuous Glucose Sensor (FREESTYLE LIBRE 3 PLUS SENSOR) MISC Inject 1 Device into the skin for continuous glucose monitoring. Change every 15 days 6 each 3   fluticasone  (FLONASE ) 50 MCG/ACT nasal spray Place 1 spray into both nostrils daily as needed for allergies or rhinitis. 16 g 3   glucose blood (ONETOUCH VERIO) test strip CHECK BLOOD SUGAR TWICE DAILY 200 strip 3   insulin  aspart (NOVOLOG  FLEXPEN) 100 UNIT/ML FlexPen Inject 0-15 Units into the skin 3 (three) times daily before meals. CBG 70 - 120: 0 units CBG 121 - 150: 2 units CBG 151 - 200: 3 units CBG 201 - 250: 5 units CBG 251 - 300: 8 units CBG 301 - 350: 11 units CBG 351 - 400: 15 units CBG > 400: call MD and obtain STAT lab verification 15 mL 0   insulin  glargine (LANTUS ) 100 UNIT/ML Solostar Pen Inject 20 Units into the skin daily. 15 mL 11   Insulin  Pen Needle (BD PEN NEEDLE NANO 2ND GEN) 32G X 4 MM MISC Use as directed 3 (three) times daily. 300 each 3   ipratropium (ATROVENT ) 0.06 % nasal spray Place 2 sprays into both nostrils 3 (three) times daily. 15 mL 12   ipratropium-albuterol  (DUONEB) 0.5-2.5 (3) MG/3ML SOLN Take 3 mLs by nebulization every 6 (six) hours as needed. (Patient taking differently: Take 3 mLs by nebulization every 6 (six) hours  as needed (SOB/Wheezing).) 360 mL 1   Lancet Devices (LANCING DEVICE) MISC Use as advised - for Delica Lancets 1 each 0   Lancets (ONETOUCH DELICA PLUS LANCET33G) MISC USE TO MONITOR GLUCOSE  LEVELS TWICE DAILY 200 each 3   Multiple Vitamins-Minerals (CENTRUM SILVER ULTRA WOMENS PO) Take 1 tablet by mouth daily.     nitroGLYCERIN  (NITROSTAT ) 0.4 MG SL  tablet Place 1 tablet (0.4 mg total) under the tongue every 5 (five) minutes as needed for chest pain. 25 tablet 3   OXYGEN  Inhale 3 Doses into the lungs as needed. 3 liters     potassium chloride  SA (KLOR-CON  M) 20 MEQ tablet Take 2 tablets (40 mEq total) by mouth daily. 180 tablet 3   Semaglutide ,0.25 or 0.5MG /DOS, (OZEMPIC , 0.25 OR 0.5 MG/DOSE,) 2 MG/3ML SOPN Inject 0.5 mg into the skin once a week. 9 mL 3   Tiotropium Bromide -Olodaterol (STIOLTO RESPIMAT ) 2.5-2.5 MCG/ACT AERS Inhale 2 puffs into the lungs daily. 4 g 11   torsemide  (DEMADEX ) 20 MG tablet Take 2 tablets (40 mg total) by mouth 2 (two) times daily. 360 tablet 3   traZODone  (DESYREL ) 50 MG tablet Take 0.5-1 tablets (25-50 mg total) by mouth at bedtime as needed for sleep. 30 tablet 5   Vitamin D , Cholecalciferol , 25 MCG (1000 UT) CAPS Take 1 capsule by mouth daily.     No current facility-administered medications on file prior to visit.   "

## 2025-01-04 ENCOUNTER — Other Ambulatory Visit: Payer: Self-pay

## 2025-01-04 ENCOUNTER — Ambulatory Visit: Admitting: Internal Medicine

## 2025-01-04 VITALS — BP 110/64 | HR 89 | Temp 97.6°F | Ht 63.0 in | Wt 149.0 lb

## 2025-01-04 DIAGNOSIS — E1122 Type 2 diabetes mellitus with diabetic chronic kidney disease: Secondary | ICD-10-CM | POA: Diagnosis not present

## 2025-01-04 DIAGNOSIS — G479 Sleep disorder, unspecified: Secondary | ICD-10-CM | POA: Diagnosis not present

## 2025-01-04 DIAGNOSIS — J9611 Chronic respiratory failure with hypoxia: Secondary | ICD-10-CM | POA: Diagnosis not present

## 2025-01-04 DIAGNOSIS — I152 Hypertension secondary to endocrine disorders: Secondary | ICD-10-CM

## 2025-01-04 DIAGNOSIS — Z0000 Encounter for general adult medical examination without abnormal findings: Secondary | ICD-10-CM | POA: Diagnosis not present

## 2025-01-04 DIAGNOSIS — I5042 Chronic combined systolic (congestive) and diastolic (congestive) heart failure: Secondary | ICD-10-CM

## 2025-01-04 DIAGNOSIS — N1832 Chronic kidney disease, stage 3b: Secondary | ICD-10-CM | POA: Insufficient documentation

## 2025-01-04 DIAGNOSIS — E1159 Type 2 diabetes mellitus with other circulatory complications: Secondary | ICD-10-CM

## 2025-01-04 DIAGNOSIS — I4811 Longstanding persistent atrial fibrillation: Secondary | ICD-10-CM | POA: Diagnosis not present

## 2025-01-04 DIAGNOSIS — E1169 Type 2 diabetes mellitus with other specified complication: Secondary | ICD-10-CM

## 2025-01-04 DIAGNOSIS — E785 Hyperlipidemia, unspecified: Secondary | ICD-10-CM

## 2025-01-04 DIAGNOSIS — Z794 Long term (current) use of insulin: Secondary | ICD-10-CM

## 2025-01-04 NOTE — Assessment & Plan Note (Signed)
Chronic Blood pressure well controlled Continue amlodipine 5 mg daily, Coreg 6.25 mg twice daily

## 2025-01-04 NOTE — Assessment & Plan Note (Signed)
 Chronic Associated with chronic kidney disease stage normal 3B Management per Dr. Trixie On Ozempic  0.5 mg weekly, NovoLog  TID before meals, Lantus  20 units daily

## 2025-01-04 NOTE — Assessment & Plan Note (Signed)
 Chronic Appears euvolemic here today Following with heart failure clinic Continue current medications

## 2025-01-04 NOTE — Assessment & Plan Note (Signed)
 Chronic Overall kidney function stable Stage 3b Reviewed recent blood work

## 2025-01-04 NOTE — Assessment & Plan Note (Signed)
 Chronic Not currently on any cholesterol-lowering medication

## 2025-01-04 NOTE — Assessment & Plan Note (Signed)
 Chronic Following with cardiology On Eliquis  5 mg twice daily, Coreg  6.25 mg twice daily

## 2025-01-04 NOTE — Assessment & Plan Note (Signed)
 Chronic On chronic oxygen  via nasal cannula Following with pulmonary Oxygen  goal more than 88%

## 2025-01-04 NOTE — Patient Instructions (Addendum)
 "     Medications changes include :   None       Return in about 1 year (around 01/04/2026) for Physical Exam.    Health Maintenance, Female Adopting a healthy lifestyle and getting preventive care are important in promoting health and wellness. Ask your health care provider about: The right schedule for you to have regular tests and exams. Things you can do on your own to prevent diseases and keep yourself healthy. What should I know about diet, weight, and exercise? Eat a healthy diet  Eat a diet that includes plenty of vegetables, fruits, low-fat dairy products, and lean protein. Do not eat a lot of foods that are high in solid fats, added sugars, or sodium. Maintain a healthy weight Body mass index (BMI) is used to identify weight problems. It estimates body fat based on height and weight. Your health care provider can help determine your BMI and help you achieve or maintain a healthy weight. Get regular exercise Get regular exercise. This is one of the most important things you can do for your health. Most adults should: Exercise for at least 150 minutes each week. The exercise should increase your heart rate and make you sweat (moderate-intensity exercise). Do strengthening exercises at least twice a week. This is in addition to the moderate-intensity exercise. Spend less time sitting. Even light physical activity can be beneficial. Watch cholesterol and blood lipids Have your blood tested for lipids and cholesterol at 89 years of age, then have this test every 5 years. Have your cholesterol levels checked more often if: Your lipid or cholesterol levels are high. You are older than 89 years of age. You are at high risk for heart disease. What should I know about cancer screening? Depending on your health history and family history, you may need to have cancer screening at various ages. This may include screening for: Breast cancer. Cervical cancer. Colorectal  cancer. Skin cancer. Lung cancer. What should I know about heart disease, diabetes, and high blood pressure? Blood pressure and heart disease High blood pressure causes heart disease and increases the risk of stroke. This is more likely to develop in people who have high blood pressure readings or are overweight. Have your blood pressure checked: Every 3-5 years if you are 49-77 years of age. Every year if you are 55 years old or older. Diabetes Have regular diabetes screenings. This checks your fasting blood sugar level. Have the screening done: Once every three years after age 19 if you are at a normal weight and have a low risk for diabetes. More often and at a younger age if you are overweight or have a high risk for diabetes. What should I know about preventing infection? Hepatitis B If you have a higher risk for hepatitis B, you should be screened for this virus. Talk with your health care provider to find out if you are at risk for hepatitis B infection. Hepatitis C Testing is recommended for: Everyone born from [birth year] through 1963. Anyone with known risk factors for hepatitis C. Sexually transmitted infections (STIs) Get screened for STIs, including gonorrhea and chlamydia, if: You are sexually active and are younger than 89 years of age. You are older than 89 years of age and your health care provider tells you that you are at risk for this type of infection. Your sexual activity has changed since you were last screened, and you are at increased risk for chlamydia or gonorrhea. Ask your health care provider if  you are at risk. Ask your health care provider about whether you are at high risk for HIV. Your health care provider may recommend a prescription medicine to help prevent HIV infection. If you choose to take medicine to prevent HIV, you should first get tested for HIV. You should then be tested every 3 months for as long as you are taking the medicine. Pregnancy If you are  about to stop having your period (premenopausal) and you may become pregnant, seek counseling before you get pregnant. Take 400 to 800 micrograms (mcg) of folic acid every day if you become pregnant. Ask for birth control (contraception) if you want to prevent pregnancy. Osteoporosis and menopause Osteoporosis is a disease in which the bones lose minerals and strength with aging. This can result in bone fractures. If you are 78 years old or older, or if you are at risk for osteoporosis and fractures, ask your health care provider if you should: Be screened for bone loss. Take a calcium  or vitamin D  supplement to lower your risk of fractures. Be given hormone replacement therapy (HRT) to treat symptoms of menopause. Follow these instructions at home: Alcohol use Do not drink alcohol if: Your health care provider tells you not to drink. You are pregnant, may be pregnant, or are planning to become pregnant. If you drink alcohol: Limit how much you have to: 0-1 drink a day. Know how much alcohol is in your drink. In the [place] one drink equals one 12 oz bottle of beer (355 mL), one 5 oz glass of wine (148 mL), or one 1 oz glass of hard liquor (44 mL). Lifestyle Do not use any products that contain nicotine or tobacco. These products include cigarettes, chewing tobacco, and vaping devices, such as e-cigarettes. If you need help quitting, ask your health care provider. Do not use street drugs. Do not share needles. Ask your health care provider for help if you need support or information about quitting drugs. General instructions Schedule regular health, dental, and eye exams. Stay current with your vaccines. Tell your health care provider if: You often feel depressed. You have ever been abused or do not feel safe at home. Summary Adopting a healthy lifestyle and getting preventive care are important in promoting health and wellness. Follow your health care provider's instructions about  healthy diet, exercising, and getting tested or screened for diseases. Follow your health care provider's instructions on monitoring your cholesterol and blood pressure. This information is not intended to replace advice given to you by your health care provider. Make sure you discuss any questions you have with your health care provider. Document Revised: 05/04/2021 Document Reviewed: 05/04/2021 Elsevier Patient Education  2024 [employer]. "

## 2025-01-04 NOTE — Assessment & Plan Note (Signed)
 Chronic Intermittent Continue trazodone  25-50 mg nightly as needed-she is not taking this nightly

## 2025-01-05 ENCOUNTER — Other Ambulatory Visit: Payer: Self-pay | Admitting: Internal Medicine

## 2025-01-05 ENCOUNTER — Other Ambulatory Visit (HOSPITAL_COMMUNITY): Payer: Self-pay

## 2025-01-05 DIAGNOSIS — E1122 Type 2 diabetes mellitus with diabetic chronic kidney disease: Secondary | ICD-10-CM

## 2025-01-07 ENCOUNTER — Encounter: Payer: Self-pay | Admitting: *Deleted

## 2025-01-07 ENCOUNTER — Other Ambulatory Visit: Payer: Self-pay

## 2025-01-07 ENCOUNTER — Other Ambulatory Visit (HOSPITAL_BASED_OUTPATIENT_CLINIC_OR_DEPARTMENT_OTHER): Payer: Self-pay

## 2025-01-07 ENCOUNTER — Other Ambulatory Visit (HOSPITAL_COMMUNITY): Payer: Self-pay

## 2025-01-07 MED ORDER — FREESTYLE LIBRE 3 READER DEVI
0 refills | Status: AC
  Filled 2025-01-07 – 2025-01-08 (×2): qty 1, 1d supply, fill #0

## 2025-01-08 ENCOUNTER — Other Ambulatory Visit: Payer: Self-pay

## 2025-01-08 ENCOUNTER — Other Ambulatory Visit (HOSPITAL_COMMUNITY): Payer: Self-pay

## 2025-01-08 ENCOUNTER — Other Ambulatory Visit (HOSPITAL_BASED_OUTPATIENT_CLINIC_OR_DEPARTMENT_OTHER): Payer: Self-pay

## 2025-01-09 ENCOUNTER — Other Ambulatory Visit (HOSPITAL_BASED_OUTPATIENT_CLINIC_OR_DEPARTMENT_OTHER): Payer: Self-pay

## 2025-01-09 ENCOUNTER — Other Ambulatory Visit: Payer: Self-pay | Admitting: Internal Medicine

## 2025-01-09 ENCOUNTER — Other Ambulatory Visit: Payer: Self-pay

## 2025-01-09 MED ORDER — INSUPEN PEN NEEDLES 32G X 4 MM MISC
Freq: Three times a day (TID) | 3 refills | Status: AC
  Filled 2025-01-09: qty 300, 100d supply, fill #0

## 2025-01-10 ENCOUNTER — Other Ambulatory Visit (HOSPITAL_COMMUNITY): Payer: Self-pay

## 2025-01-10 ENCOUNTER — Other Ambulatory Visit (HOSPITAL_COMMUNITY): Payer: Self-pay | Admitting: Internal Medicine

## 2025-01-10 ENCOUNTER — Other Ambulatory Visit: Payer: Self-pay | Admitting: Internal Medicine

## 2025-01-10 ENCOUNTER — Other Ambulatory Visit: Payer: Self-pay

## 2025-01-10 DIAGNOSIS — E1122 Type 2 diabetes mellitus with diabetic chronic kidney disease: Secondary | ICD-10-CM

## 2025-01-10 MED ORDER — TORSEMIDE 20 MG PO TABS
40.0000 mg | ORAL_TABLET | Freq: Two times a day (BID) | ORAL | 3 refills | Status: AC
  Filled 2025-01-10: qty 360, 90d supply, fill #0

## 2025-01-10 MED ORDER — POTASSIUM CHLORIDE CRYS ER 20 MEQ PO TBCR
40.0000 meq | EXTENDED_RELEASE_TABLET | Freq: Every day | ORAL | 3 refills | Status: AC
  Filled 2025-01-10: qty 180, 90d supply, fill #0

## 2025-01-12 ENCOUNTER — Other Ambulatory Visit (HOSPITAL_BASED_OUTPATIENT_CLINIC_OR_DEPARTMENT_OTHER): Payer: Self-pay

## 2025-01-12 ENCOUNTER — Other Ambulatory Visit: Payer: Self-pay | Admitting: Podiatry

## 2025-01-12 ENCOUNTER — Other Ambulatory Visit (HOSPITAL_COMMUNITY): Payer: Self-pay

## 2025-01-14 ENCOUNTER — Other Ambulatory Visit (HOSPITAL_COMMUNITY): Payer: Self-pay

## 2025-01-15 ENCOUNTER — Other Ambulatory Visit: Payer: Self-pay | Admitting: Internal Medicine

## 2025-01-15 ENCOUNTER — Other Ambulatory Visit (INDEPENDENT_AMBULATORY_CARE_PROVIDER_SITE_OTHER)

## 2025-01-15 DIAGNOSIS — R300 Dysuria: Secondary | ICD-10-CM

## 2025-01-16 ENCOUNTER — Encounter: Payer: Self-pay | Admitting: Internal Medicine

## 2025-01-16 LAB — URINALYSIS, ROUTINE W REFLEX MICROSCOPIC
Bilirubin Urine: NEGATIVE
Ketones, ur: NEGATIVE
Nitrite: NEGATIVE
Specific Gravity, Urine: 1.015 (ref 1.000–1.030)
Total Protein, Urine: 30 — AB
Urine Glucose: NEGATIVE
Urobilinogen, UA: 0.2 (ref 0.0–1.0)
pH: 6 (ref 5.0–8.0)

## 2025-01-16 LAB — URINE CULTURE: Result:: NO GROWTH

## 2025-01-18 ENCOUNTER — Encounter: Payer: Self-pay | Admitting: Podiatry

## 2025-01-21 ENCOUNTER — Other Ambulatory Visit (HOSPITAL_COMMUNITY): Payer: Self-pay

## 2025-01-21 ENCOUNTER — Other Ambulatory Visit: Payer: Self-pay | Admitting: Internal Medicine

## 2025-01-21 ENCOUNTER — Other Ambulatory Visit: Payer: Self-pay

## 2025-01-21 MED ORDER — NOVOLOG FLEXPEN 100 UNIT/ML ~~LOC~~ SOPN
6.0000 [IU] | PEN_INJECTOR | Freq: Three times a day (TID) | SUBCUTANEOUS | 3 refills | Status: AC
  Filled 2025-01-21: qty 30, 33d supply, fill #0

## 2025-01-21 NOTE — Telephone Encounter (Signed)
 Please schedule her a follow up.

## 2025-01-29 ENCOUNTER — Ambulatory Visit: Admitting: Podiatry

## 2025-01-31 ENCOUNTER — Other Ambulatory Visit: Payer: Self-pay | Admitting: Internal Medicine

## 2025-01-31 ENCOUNTER — Ambulatory Visit: Admitting: Internal Medicine

## 2025-01-31 ENCOUNTER — Other Ambulatory Visit (HOSPITAL_COMMUNITY): Payer: Self-pay

## 2025-01-31 ENCOUNTER — Other Ambulatory Visit: Payer: Self-pay

## 2025-01-31 MED ORDER — OZEMPIC (0.25 OR 0.5 MG/DOSE) 2 MG/3ML ~~LOC~~ SOPN
0.5000 mg | PEN_INJECTOR | SUBCUTANEOUS | 3 refills | Status: AC
  Filled 2025-01-31: qty 9, 84d supply, fill #0

## 2025-01-31 NOTE — Progress Notes (Unsigned)
 Patient ID: [name], female   DOB: 1931-02-27, 89 y.o.   MRN: [id number] This note was precharted 89/05/2025.  HPI: [name] is a 89 y.o.-year-old female, self-referred (her daughter is also my patient), for management of DM2, dx in 1994, insulin -dependent, uncontrolled, with complications (CAD, s/p NSTEMI 2008; CHF; PAD; atrial fibrillation/atrial flutter; peripheral neuropathy; CKD stage IIIa).   Last visit with me 4 months ago.   Patient's caregiver accompanies her today and offers information about patient's diet, activity, and insulin  doses.  Interim history: She continues to have chest pain - on prn NTG; no shortness of breath, nausea. She was admitted with DKA and AKI in the setting of a UTI in 10/2024.  Glucose was >600, and she was confused.  Reviewed HbA1c: Lab Results  Component Value Date   HGBA1C QNSTST 11/24/2024   HGBA1C 8.7 (A) 08/30/2024   HGBA1C 8.3 (A) 04/26/2024   HGBA1C 7.5 (H) 10/08/2023   HGBA1C 6.9 06/23/2023   HGBA1C 7.3 (A) 02/03/2023   HGBA1C 7.0 (A) 07/29/2022   HGBA1C 8.4 (A) 03/26/2022   HGBA1C 7.3 (A) 12/25/2021   HGBA1C 9.0 (A) 09/14/2021   HGBA1C 8.3 (A) 05/28/2021   HGBA1C 8.0 (A) 03/24/2021   HGBA1C 9.5 (A) 12/02/2020   HGBA1C 7.8 (A) 08/21/2020   HGBA1C 8.3 (H) 05/15/2020   HGBA1C 8.0 (A) 05/06/2020   HGBA1C 9.2 (H) 02/20/2020   HGBA1C 7.9 (A) 11/14/2019   HGBA1C 7.6 (A) 08/22/2019   HGBA1C 8.1 (A) 05/01/2019   HGBA1C 8.9 (H) 01/31/2019   HGBA1C 7.0 (A) 12/06/2018   HGBA1C 6.5 (A) 09/20/2018   HGBA1C 6.4 (A) 07/19/2018   HGBA1C 7.3 05/08/2018   HGBA1C 8.0 (H) 01/30/2018   HGBA1C 8.3 11/23/2017   HGBA1C 7.4 08/23/2017   HGBA1C 8.2 01/24/2017   HGBA1C 7.7 09/08/2016   HGBA1C 8.4 (H) 06/09/2016   HGBA1C 7.8 (H) 03/04/2016   HGBA1C 7.6 12/08/2015   HGBA1C 8.3 08/26/2015   HGBA1C 7.9 (H) 05/24/2015   HGBA1C 8.6 (H) 02/06/2015   HGBA1C 8.7 (H) 11/15/2014   HGBA1C 8.1 (H) 08/21/2014   HGBA1C 9.6 (H) 01/21/2014   HGBA1C  9.8 (H) 09/13/2013   She was previously on: - Humalog  75/25 >> 70/30 Novolin  (24$ at [facility], w/o Rx) 32 units before breakfast and 6 units before dinner >> .SABRASABRA 14 units 30 min before breakfast and 10-12 units 30 min before dinner - Ozempic  0.25 >> 0.5 mg weekly-added 08/2021 >> stopped 12/2021 2/2 $$ but now new insurance >> restarted (patient assistance)  Then on: - Ozempic  0.5 >> 0.25 mg weekly 2/2 weight loss >> 0.5 mg weekly - >> stopped 2/2 lows 06/2023 - NovoLog  4-6 units 3x a day 10-15 min before meals >> taking this after meals Before b'fast:6 units  Before lunch: 8-10 units >> 4-6 >> 8 Units  At last visit I recommended the following regimen: - NovoLog  10-15 minutes before all 3 meals: Smaller meal: 6 units  Regular meal: 8 units Larger meal: 10 units - Ozempic  0.5 mg weekly  Pt checks her sugars >4x a day and they are:  Previously:  Previously:  Lowest sugar was 49 (taking 15-20 units at that time) >> 70s; she has hypoglycemia awareness at 70.  Highest sugar was 497 (steroids) >>... 300s.  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  Pt's meals are: - Breakfast: frozen banana, berries, oatmeal, protein powder >> yogurt and fruit - dinner: Includes soup  But she has lunch and dinner in the cafeteria [employer]  Living - facility: [place]):   -+ CKD, last BUN/creatinine:  Lab Results  Component Value Date   BUN 19 11/26/2024   BUN 21 11/25/2024   CREATININE 1.12 (H) 11/26/2024   CREATININE 1.06 (H) 11/25/2024   Lab Results  Component Value Date   MICRALBCREAT 427.9 (H) 10/11/2024   MICRALBCREAT 661 (H) 08/30/2024   MICRALBCREAT 300 (H) 04/26/2024   MICRALBCREAT 7.3 10/28/2009   MICRALBCREAT 10.3 01/25/2008   MICRALBCREAT 31.5 (H) 10/13/2007  She is not on an ACE inhibitor/ARB  -+ HL; last set of lipids: Lab Results  Component Value Date   CHOL 315 (H) 01/18/2023   HDL 54.10 01/18/2023   LDLCALC 229 (H) 01/18/2023   LDLDIRECT 144.6 02/03/2010    TRIG 161.0 (H) 01/18/2023   CHOLHDL 6 01/18/2023  She had muscle weakness from statins.   - last eye exam was in 2022 or 2023: No DR reportedly.  - no numbness and tingling in her feet.  She saw podiatry, but not recently.  Last foot exam 09/20/2024.  Pt has FH of DM in mother.  She also has a history of HTN, osteoporosis, protein-calorie malnutrition, iron deficiency anemia. No FH of MTC, no pancreatitis hx.  ROS: + See HPI  Past Medical History:  Diagnosis Date   Anemia    iron defficiency   Anginal pain    Arthritis    knees, feet, hands; joints (05/27/2015)   Asthma    Atrial fibrillation or flutter    s/p RFCA 7/08;   s/p DCCV in past;   previously on amiodarone ;  amio stopped due to lung toxicity   CAD (coronary artery disease)    s/p NSTEMI tx with BMS to OM1 3/08;  cath 3/08: pOM 99% tx with PCI, pLAD 20%, ? mod stenosis at the AM   CAP (community acquired pneumonia) 05/24/2015   CHF (congestive heart failure) (HCC)    Chronic diastolic heart failure (HCC)    echo 11/11:  EF 55-60%, severe LVH, mod LAE, mild MR, mildly increased PASP   COPD (chronic obstructive pulmonary disease) (HCC)    Degenerative joint disease    Dystrophy, corneal stromal    Heart murmur    HLD (hyperlipidemia)    HTN (hypertension)    essential nos   Hypopotassemia    PMH of   Muscle pain    Myocardial infarction (HCC) 2008   OSA on CPAP    Osteoporosis    Pneumonia 05/27/2015   Protein calorie malnutrition    Rash and nonspecific skin eruption    both arms,awaiting bio   dr robinson   Seasonal allergies    Shortness of breath dyspnea    Type II diabetes mellitus (HCC)    Dr Kassie   Past Surgical History:  Procedure Laterality Date   A FLUTTER ABLATION     Dr Waddell   ABDOMINAL AORTOGRAM W/LOWER EXTREMITY Bilateral 04/11/2020   Procedure: ABDOMINAL AORTOGRAM W/LOWER EXTREMITY;  Surgeon: Eliza Lonni RAMAN, MD;  Location: [facility] INVASIVE CV LAB;  Service: Cardiovascular;   Laterality: Bilateral;   CATARACT EXTRACTION W/ INTRAOCULAR LENS  IMPLANT, BILATERAL Bilateral    CHOLECYSTECTOMY     COLONOSCOPY  6/07   2 polyps Dr. Cinda in [place]   colonoscopy with polypectomy      X 2; [facility]   CORNEAL TRANSPLANT Bilateral    CORONARY ANGIOPLASTY WITH STENT PLACEMENT  02/2007   BMS w/Dr Waddell   CORONARY ATHERECTOMY N/A 05/16/2020   Procedure: CORONARY ATHERECTOMY;  Surgeon: Jordan, Peter M, MD;  Location: [facility] INVASIVE CV LAB;  Service: Cardiovascular;  Laterality: N/A;   CORONARY BALLOON ANGIOPLASTY N/A 05/16/2020   Procedure: CORONARY BALLOON ANGIOPLASTY;  Surgeon: Jordan, Peter M, MD;  Location: [facility] INVASIVE CV LAB;  Service: Cardiovascular;  Laterality: N/A;   CORONARY STENT INTERVENTION N/A 05/16/2020   Procedure: CORONARY STENT INTERVENTION;  Surgeon: Jordan, Peter M, MD;  Location: [facility] INVASIVE CV LAB;  Service: Cardiovascular;  Laterality: N/A;   CORONARY ULTRASOUND/IVUS N/A 05/16/2020   Procedure: Intravascular Ultrasound/IVUS;  Surgeon: Jordan, Peter M, MD;  Location: [facility] INVASIVE CV LAB;  Service: Cardiovascular;  Laterality: N/A;   EYE SURGERY     gravid 2 para 2     KNEE CARTILAGE SURGERY Right    LEFT AND RIGHT HEART CATHETERIZATION WITH CORONARY ANGIOGRAM N/A 05/07/2014   Procedure: LEFT AND RIGHT HEART CATHETERIZATION WITH CORONARY ANGIOGRAM;  Surgeon: Peter M Jordan, MD;  Location: [facility] CATH LAB;  Service: Cardiovascular;  Laterality: N/A;   PERIPHERAL VASCULAR INTERVENTION  04/11/2020   Procedure: PERIPHERAL VASCULAR INTERVENTION;  Surgeon: Eliza Lonni RAMAN, MD;  Location: [facility] INVASIVE CV LAB;  Service: Cardiovascular;;   RIGHT HEART CATH N/A 02/04/2021   Procedure: RIGHT HEART CATH;  Surgeon: Cherrie Toribio SAUNDERS, MD;  Location: [facility] INVASIVE CV LAB;  Service: Cardiovascular;  Laterality: N/A;   RIGHT/LEFT HEART CATH AND CORONARY ANGIOGRAPHY N/A 05/16/2020   Procedure: RIGHT/LEFT HEART CATH AND CORONARY ANGIOGRAPHY;  Surgeon: Cherrie Toribio SAUNDERS, MD;   Location: [facility] INVASIVE CV LAB;  Service: Cardiovascular;  Laterality: N/A;   TOTAL KNEE ARTHROPLASTY Right 06/28/2016   Procedure: TOTAL KNEE ARTHROPLASTY;  Surgeon: Dempsey Sensor, MD;  Location: [facility] OR;  Service: Orthopedics;  Laterality: Right;   Social History   Socioeconomic History   Marital status: Widowed    Spouse name: Not on file   Number of children: 2   Years of education: 16   Highest education level: Bachelor's degree (e.g., BA, AB, BS)  Occupational History   Occupation: Teacher/ Retired  Tobacco Use   Smoking status: Former    Current packs/day: 0.00    Average packs/day: 0.3 packs/day for 18.0 years (4.5 ttl pk-yrs)    Types: Cigarettes    Start date: 12/27/1950    Quit date: 12/27/1968    Years since quitting: 56.1   Smokeless tobacco: Never   Tobacco comments:    smoked 1952- 1970, up to 1 pp week  Vaping Use   Vaping status: Never Used  Substance and Sexual Activity   Alcohol use: Yes    Alcohol/week: 1.0 standard drink of alcohol    Types: 1 Glasses of wine per week    Comment: occass   Drug use: No   Sexual activity: Not Currently  Other Topics Concern   Not on file  Social History Narrative   Teacher. Widowed. Rarely drinks cafeine.    Social Drivers of Health   Tobacco Use: Medium Risk (01/03/2025)   Patient History    Smoking Tobacco Use: Former    Smokeless Tobacco Use: Never    Passive Exposure: Not on file  Financial Resource Strain: Low Risk (09/11/2024)   Overall Financial Resource Strain (CARDIA)    Difficulty of Paying Living Expenses: Not hard at all  Food Insecurity: No Food Insecurity (11/25/2024)   Epic    Worried About Programme Researcher, Broadcasting/film/video in the Last Year: Never true    Ran Out of Food in the Last Year: Never true  Transportation Needs: No Transportation  Needs (11/25/2024)   Epic    Lack of Transportation (Medical): No    Lack of Transportation (Non-Medical): No  Physical Activity: Sufficiently Active (09/11/2024)   Exercise Vital Sign     Days of Exercise per Week: 5 days    Minutes of Exercise per Session: 30 min  Stress: No Stress Concern Present (09/11/2024)   [employer] of Occupational Health - Occupational Stress Questionnaire    Feeling of Stress: Not at all  Social Connections: Moderately Integrated (11/23/2024)   Social Connection and Isolation Panel    Frequency of Communication with Friends and Family: More than three times a week    Frequency of Social Gatherings with Friends and Family: More than three times a week    Attends Religious Services: More than 4 times per year    Active Member of [employer] or Organizations: Yes    Attends Banker Meetings: More than 4 times per year    Marital Status: Widowed  Intimate Partner Violence: Not At Risk (11/25/2024)   Epic    Fear of Current or Ex-Partner: No    Emotionally Abused: No    Physically Abused: No    Sexually Abused: No  Depression (PHQ2-9): Low Risk (10/10/2024)   Depression (PHQ2-9)    PHQ-2 Score: 0  Alcohol Screen: Low Risk (09/11/2024)   Alcohol Screen    Last Alcohol Screening Score (AUDIT): 2  Housing: Low Risk (11/25/2024)   Epic    Unable to Pay for Housing in the Last Year: No    Number of Times Moved in the Last Year: 0    Homeless in the Last Year: No  Utilities: Not At Risk (11/25/2024)   Epic    Threatened with loss of utilities: No  Health Literacy: Adequate Health Literacy (09/11/2024)   B1300 Health Literacy    Frequency of need for help with medical instructions: Never   Current Outpatient Medications on File Prior to Visit  Medication Sig Dispense Refill   albuterol  (VENTOLIN  HFA) 108 (90 Base) MCG/ACT inhaler Inhale 2 puffs into the lungs every 6 (six) hours as needed for wheezing or shortness of breath. 6.7 g 2   amLODipine  (NORVASC ) 5 MG tablet TAKE 1 TABLET BY MOUTH DAILY 90 tablet 3   apixaban  (ELIQUIS ) 5 MG TABS tablet Take 1 tablet (5 mg total) by mouth 2 (two) times daily. 180 tablet 3   Blood Glucose  Monitoring Suppl (ONETOUCH VERIO) w/Device KIT Use as instructed to check blood sugar 1 kit 0   carvedilol  (COREG ) 6.25 MG tablet Take 1 tablet (6.25 mg total) by mouth 2 (two) times daily with a meal. 180 tablet 3   Continuous Glucose Receiver (FREESTYLE LIBRE 3 READER) DEVI Use to check glucose continuously. 1 each 0   Continuous Glucose Sensor (FREESTYLE LIBRE 3 PLUS SENSOR) MISC Inject 1 Device into the skin for continuous glucose monitoring. Change every 15 days 6 each 3   fluticasone  (FLONASE ) 50 MCG/ACT nasal spray Place 1 spray into both nostrils daily as needed for allergies or rhinitis. 16 g 3   glucose blood (ONETOUCH VERIO) test strip CHECK BLOOD SUGAR TWICE DAILY 200 strip 3   insulin  aspart (NOVOLOG  FLEXPEN) 100 UNIT/ML FlexPen Inject 0-15 Units into the skin 3 (three) times daily before meals. CBG 70 - 120: 0 units CBG 121 - 150: 2 units CBG 151 - 200: 3 units CBG 201 - 250: 5 units CBG 251 - 300: 8 units CBG 301 - 350: 11 units CBG  351 - 400: 15 units CBG > 400: call MD and obtain STAT lab verification 15 mL 0   insulin  aspart (NOVOLOG  FLEXPEN) 100 UNIT/ML FlexPen Inject 6-10 Units into the skin 3 (three) times daily before meals. 30 mL 3   insulin  glargine (LANTUS ) 100 UNIT/ML Solostar Pen Inject 20 Units into the skin daily. 15 mL 11   Insulin  Pen Needle (INSUPEN PEN NEEDLES) 32G X 4 MM MISC Use as directed 3 (three) times daily. 300 each 3   ipratropium (ATROVENT ) 0.06 % nasal spray Place 2 sprays into both nostrils 3 (three) times daily. 15 mL 12   ipratropium-albuterol  (DUONEB) 0.5-2.5 (3) MG/3ML SOLN Take 3 mLs by nebulization every 6 (six) hours as needed. (Patient taking differently: Take 3 mLs by nebulization every 6 (six) hours as needed (SOB/Wheezing).) 360 mL 1   Lancet Devices (LANCING DEVICE) MISC Use as advised - for Delica Lancets 1 each 0   Lancets (ONETOUCH DELICA PLUS LANCET33G) MISC USE TO MONITOR GLUCOSE  LEVELS TWICE DAILY 200 each 3   Multiple Vitamins-Minerals  (CENTRUM SILVER ULTRA WOMENS PO) Take 1 tablet by mouth daily.     nitroGLYCERIN  (NITROSTAT ) 0.4 MG SL tablet Place 1 tablet (0.4 mg total) under the tongue every 5 (five) minutes as needed for chest pain. 25 tablet 3   OXYGEN  Inhale 3 Doses into the lungs as needed. 3 liters     potassium chloride  SA (KLOR-CON  M) 20 MEQ tablet Take 2 tablets (40 mEq total) by mouth daily. 180 tablet 3   Tiotropium Bromide -Olodaterol (STIOLTO RESPIMAT ) 2.5-2.5 MCG/ACT AERS Inhale 2 puffs into the lungs daily. 4 g 11   torsemide  (DEMADEX ) 20 MG tablet Take 2 tablets (40 mg total) by mouth 2 (two) times daily. 360 tablet 3   traZODone  (DESYREL ) 50 MG tablet Take 0.5-1 tablets (25-50 mg total) by mouth at bedtime as needed for sleep. 30 tablet 5   Vitamin D , Cholecalciferol , 25 MCG (1000 UT) CAPS Take 1 capsule by mouth daily.     No current facility-administered medications on file prior to visit.   Allergies  Allergen Reactions   Levaquin  [Levofloxacin ] Shortness Of Breath and Swelling    angioedema   Lobster [Shellfish Allergy] Other (See Comments)    angioedema   Penicillins Rash    Has patient had a PCN reaction causing immediate rash, facial/tongue/throat swelling, SOB or lightheadedness with hypotension: Yes Has patient had a PCN reaction causing severe rash involving mucus membranes or skin necrosis: No Has patient had a PCN reaction that required hospitalization: No Has patient had a PCN reaction occurring within the last 10 years: No If all of the above answers are NO, then may proceed with Cephalosporin use.   Valsartan Other (See Comments)    REACTION: angioedema   Codeine Other (See Comments)    Mental status changes   Lipitor [Atorvastatin ] Other (See Comments)    weakness   Oxycodone  Other (See Comments)    hallucinations   Statins     Made her too weak   Tramadol    Amlodipine  Besylate Other (See Comments)    REACTION: tingling in lips & gum edema 7/12: talked to patient, states she  is tolerating well   Crestor  [Rosuvastatin  Calcium ] Rash   Tramadol Hcl Nausea And Vomiting   Family History  Problem Relation Age of Onset   Diabetes Mother    Hypertension Mother    Transient ischemic attack Mother    Arthritis Mother    Heart attack Father  72   Arthritis Father    Hypertension Father    Breast cancer Maternal Aunt    Arthritis Maternal Aunt    PE: There were no vitals taken for this visit. Wt Readings from Last 10 Encounters:  01/04/25 149 lb (67.6 kg)  11/23/24 140 lb 14 oz (63.9 kg)  11/15/24 149 lb 3.2 oz (67.7 kg)  11/07/24 151 lb (68.5 kg)  10/10/24 145 lb (65.8 kg)  09/20/24 145 lb (65.8 kg)  09/11/24 145 lb (65.8 kg)  08/30/24 148 lb 3.2 oz (67.2 kg)  08/21/24 148 lb (67.1 kg)  04/26/24 149 lb 9.6 oz (67.9 kg)   Constitutional: Normal weight, in NAD Eyes: EOMI, no exophthalmos ENT: no thyromegaly, no cervical lymphadenopathy Cardiovascular: RRR, No MRG, B pitting LE edema - wears compression hoses Respiratory: CTA B Musculoskeletal: no deformities Skin: + stasis dermatitis rash - bilateral lower legs Neurological: no tremor with outstretched hands . ASSESSMENT: 1. DM2, insulin -dependent, uncontrolled, with complications - CAD, s/p NSTEMI 2008 - CHF - PAD - h/o R stent - atrial fibrillation/atrial flutter -previously on amiodarone  - peripheral neuropathy - CKD stage IIIa  2. Obesity class 1  3. HL  PLAN:  1. Patient with longstanding, uncontrolled, type 2 diabetes, on mealtime insulin  and GLP-1 receptor agonist, with still poor control.  Latest HbA1c was 8.7% 4 months ago.  Since last visit, however, she had a DKA episode with blood sugars above 600 in the setting of a UTI. - At last visit, reviewing the CGM trends, sugars were again fluctuating in the very high range, above target most of the time and increasing after every meal.  They were abruptly dropping after lunch and less abruptly overnight.  Upon questioning, she was still  taking NovoLog  after meals as she forgot about the advised to take it 15 to 20 minutes before meals.  We discussed about the absolute importance of splitting the insulin  in 15 minutes before meals.  She previously requested a less complex regimen but unfortunately, other than stopping her long-acting insulin  we were not able to change this.  At last visit, her caregiver mentions the possibility of just a sliding scale to simplify her regimen but I did not feel that this could have led to good control.  We did discuss about taking certain amount of insulin  with smaller, regular, larger meals and we discussed about examples of small, regular, and large meals.  I explained that this categorizing does not necessarily relate to pure volume, but the amount of carbs in the meal. CGM interpretation: -At today's visit, we reviewed her CGM downloads: It appears that *** of values are in target range (goal >70%), while *** are higher than 180 (goal <25%), and *** are lower than 70 (goal <4%).  The calculated average blood sugar is ***.  The projected HbA1c for the next 3 months (GMI) is ***. -Reviewing the CGM trends, ***  - I suggested to:  Patient Instructions  Please use the following regimen: - NovoLog  10-15 minutes before all 3 meals: Smaller meal: 6 units  Regular meal: 8 units Larger meal: 10 units - Ozempic  0.5 mg weekly  Please return in 4 months.  - we checked her HbA1c: 7%  - advised to check sugars at different times of the day - 4x a day, rotating check times - advised for yearly eye exams >> she is UTD - She had a high ACR at last visit, at 661.  We cannot use RAAS inhibitors due to history  of angioedema with valsartan.  She is not seeing nephrology.  I will check an ACR today. - return to clinic in 4 months  2.  Obesity class I -At previous visits, I suggested to increase intake of fresh fruit, dried fruit, and other snacks.  I also recommended to try to switch from smoothies in the  morning to oatmeal. -She lost 1 pound before last visit - Will continue Ozempic  which should also help with weight loss  3. HL - Latest lipid panel was reviewed from 2024: LDL was very high: Lab Results  Component Value Date   CHOL 315 (H) 01/18/2023   HDL 54.10 01/18/2023   LDLCALC 229 (H) 01/18/2023   LDLDIRECT 144.6 02/03/2010   TRIG 161.0 (H) 01/18/2023   CHOLHDL 6 01/18/2023  - She cannot tolerate statins due to muscle weakness.  Options are ezetimibe  and PCSK9 inhibitors.  However, she is on palliative care with only mandatory medications.  We discussed about improving diet.   Lela Fendt, MD PhD [facility] Endocrinology

## 2025-02-22 ENCOUNTER — Ambulatory Visit: Admitting: Internal Medicine

## 2025-02-28 ENCOUNTER — Ambulatory Visit: Admitting: Internal Medicine

## 2025-09-16 ENCOUNTER — Ambulatory Visit
